# Patient Record
Sex: Female | Born: 1950
Health system: Southern US, Community
[De-identification: ages and names within clinical notes are randomized; demographics above are authoritative.]

## PROBLEM LIST (undated history)

## (undated) ENCOUNTER — Ambulatory Visit (HOSPITAL_BASED_OUTPATIENT_CLINIC_OR_DEPARTMENT_OTHER)

## (undated) DIAGNOSIS — R7881 Bacteremia: Secondary | ICD-10-CM

## (undated) DIAGNOSIS — Z9221 Personal history of antineoplastic chemotherapy: Secondary | ICD-10-CM

## (undated) DIAGNOSIS — I871 Compression of vein: Secondary | ICD-10-CM

## (undated) DIAGNOSIS — L0390 Cellulitis, unspecified: Secondary | ICD-10-CM

## (undated) DIAGNOSIS — I10 Essential (primary) hypertension: Secondary | ICD-10-CM

## (undated) DIAGNOSIS — G629 Polyneuropathy, unspecified: Secondary | ICD-10-CM

## (undated) DIAGNOSIS — D649 Anemia, unspecified: Secondary | ICD-10-CM

## (undated) DIAGNOSIS — M009 Pyogenic arthritis, unspecified: Secondary | ICD-10-CM

## (undated) DIAGNOSIS — I8290 Acute embolism and thrombosis of unspecified vein: Secondary | ICD-10-CM

## (undated) DIAGNOSIS — C50919 Malignant neoplasm of unspecified site of unspecified female breast: Secondary | ICD-10-CM

## (undated) DIAGNOSIS — IMO0002 Reserved for concepts with insufficient information to code with codable children: Secondary | ICD-10-CM

## (undated) DIAGNOSIS — M4628 Osteomyelitis of vertebra, sacral and sacrococcygeal region: Secondary | ICD-10-CM

## (undated) DIAGNOSIS — I509 Heart failure, unspecified: Secondary | ICD-10-CM

## (undated) DIAGNOSIS — F419 Anxiety disorder, unspecified: Secondary | ICD-10-CM

## (undated) HISTORY — PX: PICC LINE REMOVAL (ARMC HX): HXRAD1261

## (undated) HISTORY — PX: BACK SURGERY: SHX140

## (undated) HISTORY — PX: SURGICAL EXCISION OF EXCESSIVE SKIN: SHX6542

## (undated) HISTORY — PX: MASTECTOMY: SHX3

## (undated) HISTORY — PX: PICC LINE INSERTION: CATH118290

## (undated) HISTORY — DX: Essential (primary) hypertension: I10

## (undated) HISTORY — DX: Compression of vein: I87.1

## (undated) HISTORY — PX: PERIPHERALLY INSERTED CENTRAL CATHETER INSERTION: SHX2221

## (undated) HISTORY — PX: ANKLE SURGERY: SHX546

## (undated) HISTORY — DX: Malignant neoplasm of unspecified site of unspecified female breast: C50.919

## (undated) HISTORY — DX: Polyneuropathy, unspecified: G62.9

## (undated) HISTORY — DX: Acute embolism and thrombosis of unspecified vein: I82.90

## (undated) HISTORY — DX: Cellulitis, unspecified: L03.90

---

## 1984-08-27 HISTORY — PX: TUBAL LIGATION: SHX77

## 1987-08-28 HISTORY — PX: CHOLECYSTECTOMY: SHX55

## 2003-06-07 ENCOUNTER — Encounter (HOSPITAL_COMMUNITY): Admission: RE | Admit: 2003-06-07 | Discharge: 2003-09-05 | Payer: Self-pay | Admitting: Radiology

## 2003-06-07 ENCOUNTER — Encounter: Payer: Self-pay | Admitting: Radiology

## 2003-06-08 ENCOUNTER — Encounter: Payer: Self-pay | Admitting: Oncology

## 2003-06-08 ENCOUNTER — Encounter: Payer: Self-pay | Admitting: Radiology

## 2003-06-08 ENCOUNTER — Ambulatory Visit (HOSPITAL_COMMUNITY): Admission: RE | Admit: 2003-06-08 | Discharge: 2003-06-08 | Payer: Self-pay | Admitting: Oncology

## 2003-06-11 ENCOUNTER — Ambulatory Visit (HOSPITAL_COMMUNITY): Admission: RE | Admit: 2003-06-11 | Discharge: 2003-06-11 | Payer: Self-pay | Admitting: Oncology

## 2003-06-11 ENCOUNTER — Encounter: Payer: Self-pay | Admitting: Oncology

## 2003-06-15 ENCOUNTER — Encounter: Payer: Self-pay | Admitting: General Surgery

## 2003-06-15 ENCOUNTER — Ambulatory Visit (HOSPITAL_COMMUNITY): Admission: RE | Admit: 2003-06-15 | Discharge: 2003-06-15 | Payer: Self-pay | Admitting: General Surgery

## 2003-08-04 ENCOUNTER — Ambulatory Visit (HOSPITAL_COMMUNITY): Admission: RE | Admit: 2003-08-04 | Discharge: 2003-08-04 | Payer: Self-pay | Admitting: Oncology

## 2003-08-16 ENCOUNTER — Inpatient Hospital Stay (HOSPITAL_COMMUNITY): Admission: RE | Admit: 2003-08-16 | Discharge: 2003-08-27 | Payer: Self-pay | Admitting: Oncology

## 2003-10-25 ENCOUNTER — Ambulatory Visit (HOSPITAL_COMMUNITY): Admission: RE | Admit: 2003-10-25 | Discharge: 2003-10-25 | Payer: Self-pay | Admitting: Oncology

## 2003-10-27 ENCOUNTER — Ambulatory Visit (HOSPITAL_COMMUNITY): Admission: RE | Admit: 2003-10-27 | Discharge: 2003-10-27 | Payer: Self-pay | Admitting: Oncology

## 2004-01-26 ENCOUNTER — Ambulatory Visit (HOSPITAL_COMMUNITY): Admission: RE | Admit: 2004-01-26 | Discharge: 2004-01-26 | Payer: Self-pay | Admitting: Oncology

## 2004-04-26 ENCOUNTER — Ambulatory Visit (HOSPITAL_COMMUNITY): Admission: RE | Admit: 2004-04-26 | Discharge: 2004-04-26 | Payer: Self-pay | Admitting: Oncology

## 2004-06-29 ENCOUNTER — Ambulatory Visit: Payer: Self-pay | Admitting: Oncology

## 2004-07-24 ENCOUNTER — Ambulatory Visit (HOSPITAL_COMMUNITY): Admission: RE | Admit: 2004-07-24 | Discharge: 2004-07-24 | Payer: Self-pay | Admitting: Oncology

## 2004-08-17 ENCOUNTER — Ambulatory Visit: Payer: Self-pay | Admitting: Oncology

## 2004-09-07 ENCOUNTER — Ambulatory Visit (HOSPITAL_COMMUNITY): Admission: RE | Admit: 2004-09-07 | Discharge: 2004-09-07 | Payer: Self-pay | Admitting: Oncology

## 2004-09-27 DIAGNOSIS — Z9221 Personal history of antineoplastic chemotherapy: Secondary | ICD-10-CM

## 2004-09-27 HISTORY — DX: Personal history of antineoplastic chemotherapy: Z92.21

## 2004-10-05 ENCOUNTER — Ambulatory Visit: Payer: Self-pay | Admitting: Oncology

## 2004-10-16 ENCOUNTER — Ambulatory Visit (HOSPITAL_COMMUNITY): Admission: RE | Admit: 2004-10-16 | Discharge: 2004-10-16 | Payer: Self-pay | Admitting: Oncology

## 2004-10-21 ENCOUNTER — Encounter: Admission: RE | Admit: 2004-10-21 | Discharge: 2004-10-21 | Payer: Self-pay | Admitting: Oncology

## 2004-10-23 ENCOUNTER — Ambulatory Visit (HOSPITAL_COMMUNITY): Admission: RE | Admit: 2004-10-23 | Discharge: 2004-10-23 | Payer: Self-pay | Admitting: Oncology

## 2004-10-24 ENCOUNTER — Other Ambulatory Visit: Admission: RE | Admit: 2004-10-24 | Discharge: 2004-10-24 | Payer: Self-pay | Admitting: Radiology

## 2004-11-22 ENCOUNTER — Ambulatory Visit: Payer: Self-pay | Admitting: Oncology

## 2004-12-07 ENCOUNTER — Encounter (INDEPENDENT_AMBULATORY_CARE_PROVIDER_SITE_OTHER): Payer: Self-pay | Admitting: Surgery

## 2004-12-07 ENCOUNTER — Encounter (INDEPENDENT_AMBULATORY_CARE_PROVIDER_SITE_OTHER): Payer: Self-pay | Admitting: *Deleted

## 2004-12-07 ENCOUNTER — Inpatient Hospital Stay (HOSPITAL_COMMUNITY): Admission: RE | Admit: 2004-12-07 | Discharge: 2004-12-09 | Payer: Self-pay | Admitting: Surgery

## 2005-01-03 ENCOUNTER — Ambulatory Visit: Admission: RE | Admit: 2005-01-03 | Discharge: 2005-01-09 | Payer: Self-pay | Admitting: Radiation Oncology

## 2005-01-09 ENCOUNTER — Ambulatory Visit (HOSPITAL_COMMUNITY): Admission: RE | Admit: 2005-01-09 | Discharge: 2005-01-09 | Payer: Self-pay | Admitting: Oncology

## 2005-01-10 ENCOUNTER — Ambulatory Visit: Payer: Self-pay | Admitting: Oncology

## 2005-01-15 ENCOUNTER — Ambulatory Visit: Admission: RE | Admit: 2005-01-15 | Discharge: 2005-04-02 | Payer: Self-pay | Admitting: Radiation Oncology

## 2005-02-26 ENCOUNTER — Ambulatory Visit: Payer: Self-pay | Admitting: Oncology

## 2005-03-22 ENCOUNTER — Ambulatory Visit (HOSPITAL_COMMUNITY): Admission: RE | Admit: 2005-03-22 | Discharge: 2005-03-22 | Payer: Self-pay | Admitting: Oncology

## 2005-04-18 ENCOUNTER — Ambulatory Visit: Payer: Self-pay | Admitting: Oncology

## 2005-05-10 ENCOUNTER — Ambulatory Visit (HOSPITAL_COMMUNITY): Admission: RE | Admit: 2005-05-10 | Discharge: 2005-05-10 | Payer: Self-pay | Admitting: Oncology

## 2005-05-30 ENCOUNTER — Ambulatory Visit (HOSPITAL_BASED_OUTPATIENT_CLINIC_OR_DEPARTMENT_OTHER): Admission: RE | Admit: 2005-05-30 | Discharge: 2005-05-31 | Payer: Self-pay | Admitting: Surgery

## 2005-05-30 ENCOUNTER — Ambulatory Visit (HOSPITAL_COMMUNITY): Admission: RE | Admit: 2005-05-30 | Discharge: 2005-05-30 | Payer: Self-pay | Admitting: Surgery

## 2005-05-30 ENCOUNTER — Encounter (INDEPENDENT_AMBULATORY_CARE_PROVIDER_SITE_OTHER): Payer: Self-pay | Admitting: Specialist

## 2005-06-07 ENCOUNTER — Ambulatory Visit: Payer: Self-pay | Admitting: Oncology

## 2005-06-12 ENCOUNTER — Ambulatory Visit (HOSPITAL_COMMUNITY): Admission: RE | Admit: 2005-06-12 | Discharge: 2005-06-12 | Payer: Self-pay | Admitting: Oncology

## 2005-06-15 ENCOUNTER — Ambulatory Visit (HOSPITAL_COMMUNITY): Admission: RE | Admit: 2005-06-15 | Discharge: 2005-06-15 | Payer: Self-pay | Admitting: Oncology

## 2005-07-25 ENCOUNTER — Ambulatory Visit: Payer: Self-pay | Admitting: Oncology

## 2005-08-28 ENCOUNTER — Ambulatory Visit (HOSPITAL_COMMUNITY): Admission: RE | Admit: 2005-08-28 | Discharge: 2005-08-28 | Payer: Self-pay | Admitting: Oncology

## 2005-09-13 ENCOUNTER — Ambulatory Visit: Payer: Self-pay | Admitting: Oncology

## 2005-09-21 ENCOUNTER — Ambulatory Visit (HOSPITAL_COMMUNITY): Admission: RE | Admit: 2005-09-21 | Discharge: 2005-09-21 | Payer: Self-pay | Admitting: Oncology

## 2005-10-31 ENCOUNTER — Ambulatory Visit: Payer: Self-pay | Admitting: Oncology

## 2005-11-29 LAB — PROTIME-INR: INR: 2.7 (ref 2.00–3.50)

## 2005-12-04 ENCOUNTER — Ambulatory Visit (HOSPITAL_COMMUNITY): Admission: RE | Admit: 2005-12-04 | Discharge: 2005-12-04 | Payer: Self-pay | Admitting: Oncology

## 2005-12-06 LAB — COMPREHENSIVE METABOLIC PANEL
ALT: 13 U/L (ref 0–40)
AST: 14 U/L (ref 0–37)
Albumin: 4.1 g/dL (ref 3.5–5.2)
BUN: 10 mg/dL (ref 6–23)
CO2: 25 mEq/L (ref 19–32)
Calcium: 9 mg/dL (ref 8.4–10.5)
Chloride: 105 mEq/L (ref 96–112)
Creatinine, Ser: 0.8 mg/dL (ref 0.4–1.2)
Potassium: 3.7 mEq/L (ref 3.5–5.3)

## 2005-12-06 LAB — CBC WITH DIFFERENTIAL/PLATELET
Basophils Absolute: 0.1 10*3/uL (ref 0.0–0.1)
Eosinophils Absolute: 0.2 10*3/uL (ref 0.0–0.5)
HCT: 34.2 % — ABNORMAL LOW (ref 34.8–46.6)
HGB: 11.9 g/dL (ref 11.6–15.9)
MCH: 28.1 pg (ref 26.0–34.0)
MCV: 81.3 fL (ref 81.0–101.0)
MONO%: 8 % (ref 0.0–13.0)
NEUT#: 3.8 10*3/uL (ref 1.5–6.5)
NEUT%: 62.1 % (ref 39.6–76.8)
RDW: 16.1 % — ABNORMAL HIGH (ref 11.3–14.5)

## 2005-12-06 LAB — CANCER ANTIGEN 27.29: CA 27.29: 37 U/mL (ref 0–39)

## 2005-12-13 LAB — CBC WITH DIFFERENTIAL/PLATELET
BASO%: 1.3 % (ref 0.0–2.0)
Basophils Absolute: 0.1 10*3/uL (ref 0.0–0.1)
HCT: 36.6 % (ref 34.8–46.6)
HGB: 12.8 g/dL (ref 11.6–15.9)
LYMPH%: 27.9 % (ref 14.0–48.0)
MCHC: 34.9 g/dL (ref 32.0–36.0)
MONO#: 0.5 10*3/uL (ref 0.1–0.9)
NEUT%: 59.1 % (ref 39.6–76.8)
Platelets: 224 10*3/uL (ref 145–400)
WBC: 6.6 10*3/uL (ref 3.9–10.0)

## 2005-12-13 LAB — PROTIME-INR: INR: 4.5 — ABNORMAL HIGH (ref 2.00–3.50)

## 2005-12-19 ENCOUNTER — Ambulatory Visit: Payer: Self-pay | Admitting: Oncology

## 2005-12-20 LAB — PROTIME-INR
INR: 3.9 — ABNORMAL HIGH (ref 2.00–3.50)
Protime: 24 Seconds — ABNORMAL HIGH (ref 10.6–13.4)

## 2005-12-27 LAB — CBC WITH DIFFERENTIAL/PLATELET
BASO%: 0.5 % (ref 0.0–2.0)
LYMPH%: 27.4 % (ref 14.0–48.0)
MCH: 27.9 pg (ref 26.0–34.0)
MCHC: 34.9 g/dL (ref 32.0–36.0)
MCV: 80 fL — ABNORMAL LOW (ref 81.0–101.0)
MONO%: 10.1 % (ref 0.0–13.0)
Platelets: 314 10*3/uL (ref 145–400)
RBC: 4.23 10*6/uL (ref 3.70–5.32)

## 2005-12-27 LAB — PROTIME-INR: Protime: 16.5 Seconds — ABNORMAL HIGH (ref 10.6–13.4)

## 2006-01-03 LAB — CBC WITH DIFFERENTIAL/PLATELET
BASO%: 1 % (ref 0.0–2.0)
Basophils Absolute: 0.1 10*3/uL (ref 0.0–0.1)
EOS%: 4.3 % (ref 0.0–7.0)
Eosinophils Absolute: 0.4 10*3/uL (ref 0.0–0.5)
HCT: 34.8 % (ref 34.8–46.6)
HGB: 11.8 g/dL (ref 11.6–15.9)
LYMPH%: 42.7 % (ref 14.0–48.0)
MCH: 27.4 pg (ref 26.0–34.0)
MCHC: 34 g/dL (ref 32.0–36.0)
MCV: 80.6 fL — ABNORMAL LOW (ref 81.0–101.0)
MONO#: 0.5 10*3/uL (ref 0.1–0.9)
MONO%: 6 % (ref 0.0–13.0)
NEUT#: 3.9 10*3/uL (ref 1.5–6.5)
NEUT%: 45.9 % (ref 39.6–76.8)
Platelets: 317 10*3/uL (ref 145–400)
RBC: 4.31 10*6/uL (ref 3.70–5.32)
RDW: 14.4 % (ref 11.3–14.5)
WBC: 8.4 10*3/uL (ref 3.9–10.0)
lymph#: 3.6 10*3/uL — ABNORMAL HIGH (ref 0.9–3.3)

## 2006-01-03 LAB — PROTIME-INR
INR: 1 — ABNORMAL LOW (ref 2.00–3.50)
Protime: 12.6 Seconds (ref 10.6–13.4)

## 2006-01-10 LAB — PROTIME-INR: Protime: 15 Seconds — ABNORMAL HIGH (ref 10.6–13.4)

## 2006-01-17 LAB — PROTIME-INR

## 2006-01-24 LAB — CBC WITH DIFFERENTIAL/PLATELET
BASO%: 1 % (ref 0.0–2.0)
EOS%: 4.5 % (ref 0.0–7.0)
HCT: 35.9 % (ref 34.8–46.6)
HGB: 12.3 g/dL (ref 11.6–15.9)
LYMPH%: 26.1 % (ref 14.0–48.0)
MONO%: 6.5 % (ref 0.0–13.0)
WBC: 8.2 10*3/uL (ref 3.9–10.0)
lymph#: 2.2 10*3/uL (ref 0.9–3.3)

## 2006-01-24 LAB — PROTIME-INR
INR: 2.2 (ref 2.00–3.50)
Protime: 18.2 Seconds — ABNORMAL HIGH (ref 10.6–13.4)

## 2006-01-31 ENCOUNTER — Ambulatory Visit: Payer: Self-pay | Admitting: Oncology

## 2006-01-31 LAB — PROTIME-INR: Protime: 24.7 Seconds — ABNORMAL HIGH (ref 10.6–13.4)

## 2006-02-07 LAB — PROTIME-INR: Protime: 20.6 Seconds — ABNORMAL HIGH (ref 10.6–13.4)

## 2006-02-14 LAB — CBC WITH DIFFERENTIAL/PLATELET
Basophils Absolute: 0.1 10*3/uL (ref 0.0–0.1)
Eosinophils Absolute: 0.2 10*3/uL (ref 0.0–0.5)
HGB: 11.6 g/dL (ref 11.6–15.9)
MCV: 80.9 fL — ABNORMAL LOW (ref 81.0–101.0)
MONO#: 0.6 10*3/uL (ref 0.1–0.9)
MONO%: 8.2 % (ref 0.0–13.0)
NEUT#: 3.9 10*3/uL (ref 1.5–6.5)
RBC: 4.29 10*6/uL (ref 3.70–5.32)
RDW: 14.3 % (ref 11.3–14.5)
WBC: 6.8 10*3/uL (ref 3.9–10.0)
lymph#: 2 10*3/uL (ref 0.9–3.3)

## 2006-02-14 LAB — PROTIME-INR: INR: 1.7 — ABNORMAL LOW (ref 2.00–3.50)

## 2006-02-21 LAB — PROTIME-INR: Protime: 16.1 Seconds — ABNORMAL HIGH (ref 10.6–13.4)

## 2006-03-07 LAB — CBC WITH DIFFERENTIAL/PLATELET
Eosinophils Absolute: 0.2 10*3/uL (ref 0.0–0.5)
HCT: 34.3 % — ABNORMAL LOW (ref 34.8–46.6)
LYMPH%: 26.3 % (ref 14.0–48.0)
MONO#: 0.5 10*3/uL (ref 0.1–0.9)
NEUT#: 3.5 10*3/uL (ref 1.5–6.5)
NEUT%: 62 % (ref 39.6–76.8)
Platelets: 248 10*3/uL (ref 145–400)
WBC: 5.6 10*3/uL (ref 3.9–10.0)

## 2006-03-07 LAB — PROTIME-INR
INR: 2.3 (ref 2.00–3.50)
Protime: 18.4 Seconds — ABNORMAL HIGH (ref 10.6–13.4)

## 2006-03-07 LAB — CANCER ANTIGEN 27.29: CA 27.29: 34 U/mL (ref 0–39)

## 2006-03-07 LAB — COMPREHENSIVE METABOLIC PANEL
CO2: 28 mEq/L (ref 19–32)
Creatinine, Ser: 0.85 mg/dL (ref 0.40–1.20)
Glucose, Bld: 105 mg/dL — ABNORMAL HIGH (ref 70–99)
Total Bilirubin: 0.5 mg/dL (ref 0.3–1.2)
Total Protein: 7.3 g/dL (ref 6.0–8.3)

## 2006-03-07 LAB — LACTATE DEHYDROGENASE: LDH: 186 U/L (ref 94–250)

## 2006-03-11 ENCOUNTER — Encounter: Payer: Self-pay | Admitting: Cardiology

## 2006-03-11 ENCOUNTER — Ambulatory Visit: Payer: Self-pay

## 2006-03-11 ENCOUNTER — Ambulatory Visit (HOSPITAL_COMMUNITY): Admission: RE | Admit: 2006-03-11 | Discharge: 2006-03-11 | Payer: Self-pay | Admitting: Oncology

## 2006-03-14 ENCOUNTER — Ambulatory Visit: Payer: Self-pay | Admitting: Oncology

## 2006-03-14 LAB — CBC WITH DIFFERENTIAL/PLATELET
EOS%: 3.4 % (ref 0.0–7.0)
Eosinophils Absolute: 0.2 10*3/uL (ref 0.0–0.5)
LYMPH%: 28 % (ref 14.0–48.0)
MCH: 28 pg (ref 26.0–34.0)
MCV: 80.3 fL — ABNORMAL LOW (ref 81.0–101.0)
MONO%: 7.5 % (ref 0.0–13.0)
NEUT#: 4.1 10*3/uL (ref 1.5–6.5)
Platelets: 245 10*3/uL (ref 145–400)
RBC: 4.29 10*6/uL (ref 3.70–5.32)
RDW: 15.9 % — ABNORMAL HIGH (ref 11.3–14.5)

## 2006-03-14 LAB — COMPREHENSIVE METABOLIC PANEL
Albumin: 4 g/dL (ref 3.5–5.2)
Alkaline Phosphatase: 121 U/L — ABNORMAL HIGH (ref 39–117)
BUN: 13 mg/dL (ref 6–23)
Calcium: 9.1 mg/dL (ref 8.4–10.5)
Chloride: 101 mEq/L (ref 96–112)
Creatinine, Ser: 0.79 mg/dL (ref 0.40–1.20)
Glucose, Bld: 79 mg/dL (ref 70–99)
Potassium: 4.5 mEq/L (ref 3.5–5.3)

## 2006-03-14 LAB — PROTIME-INR
INR: 1.2 — ABNORMAL LOW (ref 2.00–3.50)
Protime: 13.6 Seconds — ABNORMAL HIGH (ref 10.6–13.4)

## 2006-03-14 LAB — LACTATE DEHYDROGENASE: LDH: 198 U/L (ref 94–250)

## 2006-03-28 LAB — PROTIME-INR: INR: 3.2 (ref 2.00–3.50)

## 2006-04-04 LAB — COMPREHENSIVE METABOLIC PANEL
ALT: 12 U/L (ref 0–40)
BUN: 14 mg/dL (ref 6–23)
CO2: 24 mEq/L (ref 19–32)
Calcium: 8.6 mg/dL (ref 8.4–10.5)
Chloride: 103 mEq/L (ref 96–112)
Creatinine, Ser: 0.89 mg/dL (ref 0.40–1.20)
Glucose, Bld: 87 mg/dL (ref 70–99)
Total Bilirubin: 0.3 mg/dL (ref 0.3–1.2)

## 2006-04-04 LAB — CBC WITH DIFFERENTIAL/PLATELET
BASO%: 0.7 % (ref 0.0–2.0)
Basophils Absolute: 0 10*3/uL (ref 0.0–0.1)
EOS%: 3.6 % (ref 0.0–7.0)
HCT: 35.2 % (ref 34.8–46.6)
HGB: 11.8 g/dL (ref 11.6–15.9)
LYMPH%: 38.4 % (ref 14.0–48.0)
MCH: 27.4 pg (ref 26.0–34.0)
MCHC: 33.4 g/dL (ref 32.0–36.0)
MCV: 81.9 fL (ref 81.0–101.0)
MONO%: 9.7 % (ref 0.0–13.0)
NEUT%: 47.6 % (ref 39.6–76.8)
lymph#: 2.3 10*3/uL (ref 0.9–3.3)

## 2006-04-04 LAB — PROTIME-INR

## 2006-04-04 LAB — LACTATE DEHYDROGENASE: LDH: 220 U/L (ref 94–250)

## 2006-04-11 LAB — PROTIME-INR
INR: 3.5 (ref 2.00–3.50)
Protime: 42 Seconds — ABNORMAL HIGH (ref 10.6–13.4)

## 2006-04-18 LAB — PROTIME-INR: INR: 2.8 (ref 2.00–3.50)

## 2006-04-24 ENCOUNTER — Ambulatory Visit: Payer: Self-pay | Admitting: Oncology

## 2006-04-26 LAB — COMPREHENSIVE METABOLIC PANEL
Albumin: 3.8 g/dL (ref 3.5–5.2)
BUN: 9 mg/dL (ref 6–23)
Calcium: 8.9 mg/dL (ref 8.4–10.5)
Chloride: 104 mEq/L (ref 96–112)
Glucose, Bld: 91 mg/dL (ref 70–99)
Potassium: 3.9 mEq/L (ref 3.5–5.3)

## 2006-04-26 LAB — CBC WITH DIFFERENTIAL/PLATELET
Basophils Absolute: 0.1 10*3/uL (ref 0.0–0.1)
Eosinophils Absolute: 0.2 10*3/uL (ref 0.0–0.5)
HCT: 33.9 % — ABNORMAL LOW (ref 34.8–46.6)
HGB: 11.5 g/dL — ABNORMAL LOW (ref 11.6–15.9)
MCV: 80.3 fL — ABNORMAL LOW (ref 81.0–101.0)
MONO%: 5.6 % (ref 0.0–13.0)
NEUT#: 4.8 10*3/uL (ref 1.5–6.5)
NEUT%: 64 % (ref 39.6–76.8)
RDW: 14.4 % (ref 11.3–14.5)
lymph#: 2 10*3/uL (ref 0.9–3.3)

## 2006-04-26 LAB — LACTATE DEHYDROGENASE: LDH: 232 U/L (ref 94–250)

## 2006-04-26 LAB — PROTIME-INR: Protime: 22.8 Seconds — ABNORMAL HIGH (ref 10.6–13.4)

## 2006-05-02 ENCOUNTER — Ambulatory Visit (HOSPITAL_COMMUNITY): Admission: RE | Admit: 2006-05-02 | Discharge: 2006-05-02 | Payer: Self-pay | Admitting: Oncology

## 2006-05-02 LAB — PROTIME-INR
INR: 1.7 — ABNORMAL LOW (ref 2.00–3.50)
Protime: 20.4 Seconds — ABNORMAL HIGH (ref 10.6–13.4)

## 2006-05-08 LAB — PROTIME-INR
INR: 1.7 — ABNORMAL LOW (ref 2.00–3.50)
Protime: 20.4 Seconds — ABNORMAL HIGH (ref 10.6–13.4)

## 2006-05-16 LAB — CBC WITH DIFFERENTIAL/PLATELET
BASO%: 0.7 % (ref 0.0–2.0)
Basophils Absolute: 0 10*3/uL (ref 0.0–0.1)
EOS%: 3.8 % (ref 0.0–7.0)
HCT: 33.4 % — ABNORMAL LOW (ref 34.8–46.6)
HGB: 11.7 g/dL (ref 11.6–15.9)
LYMPH%: 26.9 % (ref 14.0–48.0)
MCH: 28.1 pg (ref 26.0–34.0)
MCHC: 34.9 g/dL (ref 32.0–36.0)
MCV: 80.4 fL — ABNORMAL LOW (ref 81.0–101.0)
NEUT%: 58.6 % (ref 39.6–76.8)
Platelets: 230 10*3/uL (ref 145–400)
lymph#: 1.9 10*3/uL (ref 0.9–3.3)

## 2006-05-23 LAB — CBC WITH DIFFERENTIAL/PLATELET
Eosinophils Absolute: 0.2 10*3/uL (ref 0.0–0.5)
HCT: 35 % (ref 34.8–46.6)
LYMPH%: 26.8 % (ref 14.0–48.0)
MCHC: 34.7 g/dL (ref 32.0–36.0)
MONO#: 0.6 10*3/uL (ref 0.1–0.9)
NEUT%: 60.6 % (ref 39.6–76.8)
Platelets: 239 10*3/uL (ref 145–400)
WBC: 6.6 10*3/uL (ref 3.9–10.0)

## 2006-05-23 LAB — PROTIME-INR

## 2006-05-27 ENCOUNTER — Ambulatory Visit (HOSPITAL_COMMUNITY): Admission: RE | Admit: 2006-05-27 | Discharge: 2006-05-27 | Payer: Self-pay | Admitting: Oncology

## 2006-05-29 LAB — CBC WITH DIFFERENTIAL/PLATELET
Basophils Absolute: 0 10*3/uL (ref 0.0–0.1)
EOS%: 3.9 % (ref 0.0–7.0)
Eosinophils Absolute: 0.2 10*3/uL (ref 0.0–0.5)
HCT: 33.7 % — ABNORMAL LOW (ref 34.8–46.6)
HGB: 11.6 g/dL (ref 11.6–15.9)
MCH: 27.9 pg (ref 26.0–34.0)
MCV: 80.7 fL — ABNORMAL LOW (ref 81.0–101.0)
NEUT#: 3.3 10*3/uL (ref 1.5–6.5)
NEUT%: 63 % (ref 39.6–76.8)
RDW: 15.7 % — ABNORMAL HIGH (ref 11.3–14.5)
lymph#: 1.2 10*3/uL (ref 0.9–3.3)

## 2006-05-29 LAB — PROTIME-INR: INR: 1.7 — ABNORMAL LOW (ref 2.00–3.50)

## 2006-05-29 LAB — COMPREHENSIVE METABOLIC PANEL
BUN: 16 mg/dL (ref 6–23)
CO2: 26 mEq/L (ref 19–32)
Calcium: 9.4 mg/dL (ref 8.4–10.5)
Chloride: 105 mEq/L (ref 96–112)
Creatinine, Ser: 0.76 mg/dL (ref 0.40–1.20)
Glucose, Bld: 111 mg/dL — ABNORMAL HIGH (ref 70–99)
Total Bilirubin: 0.4 mg/dL (ref 0.3–1.2)

## 2006-05-29 LAB — LACTATE DEHYDROGENASE: LDH: 207 U/L (ref 94–250)

## 2006-05-29 LAB — CANCER ANTIGEN 27.29: CA 27.29: 27 U/mL (ref 0–39)

## 2006-06-05 ENCOUNTER — Encounter: Payer: Self-pay | Admitting: Internal Medicine

## 2006-06-05 ENCOUNTER — Ambulatory Visit: Payer: Self-pay

## 2006-06-06 ENCOUNTER — Ambulatory Visit: Payer: Self-pay | Admitting: Oncology

## 2006-06-06 LAB — PROTIME-INR
INR: 1.2 — ABNORMAL LOW (ref 2.00–3.50)
Protime: 14.4 Seconds — ABNORMAL HIGH (ref 10.6–13.4)

## 2006-06-06 LAB — CBC WITH DIFFERENTIAL/PLATELET
Eosinophils Absolute: 0.3 10*3/uL (ref 0.0–0.5)
HGB: 12.3 g/dL (ref 11.6–15.9)
MCV: 79.2 fL — ABNORMAL LOW (ref 81.0–101.0)
MONO#: 0.7 10*3/uL (ref 0.1–0.9)
MONO%: 10 % (ref 0.0–13.0)
NEUT#: 4.2 10*3/uL (ref 1.5–6.5)
RBC: 4.44 10*6/uL (ref 3.70–5.32)
RDW: 13.4 % (ref 11.3–14.5)
WBC: 7 10*3/uL (ref 3.9–10.0)
lymph#: 1.8 10*3/uL (ref 0.9–3.3)

## 2006-06-06 LAB — UA PROTEIN, DIPSTICK - CHCC: Protein, Urine: NEGATIVE mg/dL

## 2006-06-11 ENCOUNTER — Ambulatory Visit: Admission: RE | Admit: 2006-06-11 | Discharge: 2006-07-12 | Payer: Self-pay | Admitting: Radiation Oncology

## 2006-06-13 LAB — CBC WITH DIFFERENTIAL/PLATELET
BASO%: 1.4 % (ref 0.0–2.0)
EOS%: 2.3 % (ref 0.0–7.0)
MCH: 28.2 pg (ref 26.0–34.0)
MCHC: 35.6 g/dL (ref 32.0–36.0)
MCV: 79.2 fL — ABNORMAL LOW (ref 81.0–101.0)
MONO%: 5.7 % (ref 0.0–13.0)
RBC: 4.14 10*6/uL (ref 3.70–5.32)
RDW: 13.1 % (ref 11.3–14.5)
lymph#: 1.4 10*3/uL (ref 0.9–3.3)

## 2006-06-13 LAB — PROTIME-INR
INR: 1.5 — ABNORMAL LOW (ref 2.00–3.50)
Protime: 18 Seconds — ABNORMAL HIGH (ref 10.6–13.4)

## 2006-06-20 LAB — CBC WITH DIFFERENTIAL/PLATELET
Basophils Absolute: 0 10*3/uL (ref 0.0–0.1)
EOS%: 2.9 % (ref 0.0–7.0)
Eosinophils Absolute: 0.2 10*3/uL (ref 0.0–0.5)
HGB: 12.3 g/dL (ref 11.6–15.9)
MCV: 77.4 fL — ABNORMAL LOW (ref 81.0–101.0)
MONO%: 10 % (ref 0.0–13.0)
NEUT#: 4.1 10*3/uL (ref 1.5–6.5)
RBC: 4.56 10*6/uL (ref 3.70–5.32)
RDW: 12.9 % (ref 11.3–14.5)
lymph#: 1.7 10*3/uL (ref 0.9–3.3)

## 2006-06-20 LAB — PROTIME-INR
INR: 1.7 — ABNORMAL LOW (ref 2.00–3.50)
Protime: 20.4 Seconds — ABNORMAL HIGH (ref 10.6–13.4)

## 2006-06-27 LAB — PROTIME-INR: INR: 3.7 — ABNORMAL HIGH (ref 2.00–3.50)

## 2006-06-27 LAB — CBC WITH DIFFERENTIAL/PLATELET
Basophils Absolute: 0.1 10*3/uL (ref 0.0–0.1)
EOS%: 2.3 % (ref 0.0–7.0)
HGB: 12 g/dL (ref 11.6–15.9)
MCH: 27.5 pg (ref 26.0–34.0)
NEUT#: 4.4 10*3/uL (ref 1.5–6.5)
RDW: 14.2 % (ref 11.3–14.5)
WBC: 7.2 10*3/uL (ref 3.9–10.0)
lymph#: 1.9 10*3/uL (ref 0.9–3.3)

## 2006-06-27 LAB — UA PROTEIN, DIPSTICK - CHCC: Protein, Urine: <30 mg/dL

## 2006-07-04 LAB — PROTIME-INR: Protime: 20.4 Seconds — ABNORMAL HIGH (ref 10.6–13.4)

## 2006-07-11 LAB — CBC WITH DIFFERENTIAL/PLATELET
Eosinophils Absolute: 0.3 10*3/uL (ref 0.0–0.5)
HCT: 34.3 % — ABNORMAL LOW (ref 34.8–46.6)
LYMPH%: 21.8 % (ref 14.0–48.0)
MCHC: 34.3 g/dL (ref 32.0–36.0)
MCV: 81.8 fL (ref 81.0–101.0)
MONO#: 0.7 10*3/uL (ref 0.1–0.9)
MONO%: 11 % (ref 0.0–13.0)
NEUT#: 3.9 10*3/uL (ref 1.5–6.5)
NEUT%: 61.3 % (ref 39.6–76.8)
Platelets: 199 10*3/uL (ref 145–400)
RBC: 4.2 10*6/uL (ref 3.70–5.32)
WBC: 6.3 10*3/uL (ref 3.9–10.0)

## 2006-07-11 LAB — PROTIME-INR: Protime: 15.6 Seconds — ABNORMAL HIGH (ref 10.6–13.4)

## 2006-07-17 LAB — CBC WITH DIFFERENTIAL/PLATELET
Basophils Absolute: 0.1 10*3/uL (ref 0.0–0.1)
Eosinophils Absolute: 0.2 10*3/uL (ref 0.0–0.5)
HCT: 34.5 % — ABNORMAL LOW (ref 34.8–46.6)
HGB: 11.7 g/dL (ref 11.6–15.9)
LYMPH%: 27 % (ref 14.0–48.0)
MCV: 82.3 fL (ref 81.0–101.0)
MONO#: 0.6 10*3/uL (ref 0.1–0.9)
MONO%: 10 % (ref 0.0–13.0)
NEUT#: 3.2 10*3/uL (ref 1.5–6.5)
NEUT%: 56.7 % (ref 39.6–76.8)
Platelets: 231 10*3/uL (ref 145–400)
WBC: 5.6 10*3/uL (ref 3.9–10.0)

## 2006-07-17 LAB — COMPREHENSIVE METABOLIC PANEL
AST: 15 U/L (ref 0–37)
Alkaline Phosphatase: 110 U/L (ref 39–117)
BUN: 10 mg/dL (ref 6–23)
Creatinine, Ser: 0.91 mg/dL (ref 0.40–1.20)
Glucose, Bld: 104 mg/dL — ABNORMAL HIGH (ref 70–99)
Potassium: 3.9 mEq/L (ref 3.5–5.3)
Total Bilirubin: 0.6 mg/dL (ref 0.3–1.2)

## 2006-07-17 LAB — PROTHROMBIN TIME: Prothrombin Time: 20.9 seconds — ABNORMAL HIGH (ref 11.6–15.2)

## 2006-07-19 ENCOUNTER — Ambulatory Visit: Payer: Self-pay | Admitting: Oncology

## 2006-07-24 ENCOUNTER — Ambulatory Visit (HOSPITAL_COMMUNITY): Admission: RE | Admit: 2006-07-24 | Discharge: 2006-07-24 | Payer: Self-pay | Admitting: Oncology

## 2006-07-24 LAB — CBC WITH DIFFERENTIAL/PLATELET
Basophils Absolute: 0.1 10*3/uL (ref 0.0–0.1)
EOS%: 3.8 % (ref 0.0–7.0)
Eosinophils Absolute: 0.2 10*3/uL (ref 0.0–0.5)
HCT: 33.9 % — ABNORMAL LOW (ref 34.8–46.6)
HGB: 11.6 g/dL (ref 11.6–15.9)
MCH: 27.6 pg (ref 26.0–34.0)
MCV: 80.7 fL — ABNORMAL LOW (ref 81.0–101.0)
MONO%: 9.2 % (ref 0.0–13.0)
NEUT#: 3.8 10*3/uL (ref 1.5–6.5)
NEUT%: 60.9 % (ref 39.6–76.8)
RDW: 14.1 % (ref 11.3–14.5)

## 2006-07-24 LAB — PROTIME-INR: INR: 2.4 (ref 2.00–3.50)

## 2006-07-30 ENCOUNTER — Ambulatory Visit: Admission: RE | Admit: 2006-07-30 | Discharge: 2006-10-02 | Payer: Self-pay | Admitting: Radiation Oncology

## 2006-07-31 DIAGNOSIS — IMO0001 Reserved for inherently not codable concepts without codable children: Secondary | ICD-10-CM

## 2006-07-31 HISTORY — DX: Reserved for inherently not codable concepts without codable children: IMO0001

## 2006-08-01 LAB — COMPREHENSIVE METABOLIC PANEL
Albumin: 3.9 g/dL (ref 3.5–5.2)
Alkaline Phosphatase: 104 U/L (ref 39–117)
BUN: 9 mg/dL (ref 6–23)
Glucose, Bld: 109 mg/dL — ABNORMAL HIGH (ref 70–99)
Total Bilirubin: 0.9 mg/dL (ref 0.3–1.2)

## 2006-08-01 LAB — CBC WITH DIFFERENTIAL/PLATELET
Basophils Absolute: 0.1 10*3/uL (ref 0.0–0.1)
Eosinophils Absolute: 0.1 10*3/uL (ref 0.0–0.5)
HGB: 11.9 g/dL (ref 11.6–15.9)
LYMPH%: 19.7 % (ref 14.0–48.0)
MCV: 81.9 fL (ref 81.0–101.0)
MONO%: 6.7 % (ref 0.0–13.0)
NEUT#: 4.7 10*3/uL (ref 1.5–6.5)
Platelets: 238 10*3/uL (ref 145–400)

## 2006-08-01 LAB — PROTIME-INR: INR: 1.5 — ABNORMAL LOW (ref 2.00–3.50)

## 2006-08-08 LAB — CBC WITH DIFFERENTIAL/PLATELET
Basophils Absolute: 0.1 10*3/uL (ref 0.0–0.1)
EOS%: 4.5 % (ref 0.0–7.0)
HCT: 35.2 % (ref 34.8–46.6)
HGB: 12.1 g/dL (ref 11.6–15.9)
MCH: 27.7 pg (ref 26.0–34.0)
MONO#: 0.6 10*3/uL (ref 0.1–0.9)
NEUT#: 3.8 10*3/uL (ref 1.5–6.5)
NEUT%: 57.4 % (ref 39.6–76.8)
RDW: 14.7 % — ABNORMAL HIGH (ref 11.3–14.5)
WBC: 6.7 10*3/uL (ref 3.9–10.0)
lymph#: 1.9 10*3/uL (ref 0.9–3.3)

## 2006-08-08 LAB — PROTIME-INR: INR: 1.5 — ABNORMAL LOW (ref 2.00–3.50)

## 2006-08-16 LAB — CBC WITH DIFFERENTIAL/PLATELET
Eosinophils Absolute: 0.2 10*3/uL (ref 0.0–0.5)
HCT: 36.1 % (ref 34.8–46.6)
LYMPH%: 17.3 % (ref 14.0–48.0)
MCV: 83.2 fL (ref 81.0–101.0)
MONO%: 7.3 % (ref 0.0–13.0)
NEUT%: 72 % (ref 39.6–76.8)
Platelets: 238 10*3/uL (ref 145–400)
RBC: 4.34 10*6/uL (ref 3.70–5.32)
WBC: 7.4 10*3/uL (ref 3.9–10.0)

## 2006-08-16 LAB — COMPREHENSIVE METABOLIC PANEL
Albumin: 3.9 g/dL (ref 3.5–5.2)
BUN: 9 mg/dL (ref 6–23)
Calcium: 9 mg/dL (ref 8.4–10.5)
Chloride: 102 mEq/L (ref 96–112)
Glucose, Bld: 106 mg/dL — ABNORMAL HIGH (ref 70–99)
Potassium: 3.6 mEq/L (ref 3.5–5.3)

## 2006-08-16 LAB — PROTIME-INR: Protime: 20.4 Seconds — ABNORMAL HIGH (ref 10.6–13.4)

## 2006-08-22 ENCOUNTER — Ambulatory Visit (HOSPITAL_COMMUNITY): Admission: RE | Admit: 2006-08-22 | Discharge: 2006-08-22 | Payer: Self-pay | Admitting: Oncology

## 2006-08-22 LAB — PROTIME-INR: INR: 1.1 — ABNORMAL LOW (ref 2.00–3.50)

## 2006-08-22 LAB — CBC WITH DIFFERENTIAL/PLATELET
Basophils Absolute: 0.1 10*3/uL (ref 0.0–0.1)
EOS%: 4.1 % (ref 0.0–7.0)
Eosinophils Absolute: 0.3 10*3/uL (ref 0.0–0.5)
HGB: 12.1 g/dL (ref 11.6–15.9)
MONO#: 0.4 10*3/uL (ref 0.1–0.9)
NEUT#: 5.2 10*3/uL (ref 1.5–6.5)
RDW: 14.3 % (ref 11.3–14.5)
lymph#: 1 10*3/uL (ref 0.9–3.3)

## 2006-08-30 ENCOUNTER — Ambulatory Visit: Payer: Self-pay | Admitting: Oncology

## 2006-08-30 LAB — CBC WITH DIFFERENTIAL/PLATELET
BASO%: 0.5 % (ref 0.0–2.0)
Basophils Absolute: 0 10*3/uL (ref 0.0–0.1)
EOS%: 2 % (ref 0.0–7.0)
HCT: 35.5 % (ref 34.8–46.6)
LYMPH%: 21.9 % (ref 14.0–48.0)
MCH: 28 pg (ref 26.0–34.0)
MCHC: 35.1 g/dL (ref 32.0–36.0)
MCV: 79.7 fL — ABNORMAL LOW (ref 81.0–101.0)
MONO%: 6.4 % (ref 0.0–13.0)
NEUT%: 69.2 % (ref 39.6–76.8)
Platelets: 279 10*3/uL (ref 145–400)

## 2006-09-02 LAB — CBC WITH DIFFERENTIAL/PLATELET
BASO%: 0.9 % (ref 0.0–2.0)
Basophils Absolute: 0.1 10*3/uL (ref 0.0–0.1)
EOS%: 4.2 % (ref 0.0–7.0)
HCT: 35.4 % (ref 34.8–46.6)
HGB: 12.2 g/dL (ref 11.6–15.9)
LYMPH%: 29.6 % (ref 14.0–48.0)
MCH: 27.7 pg (ref 26.0–34.0)
MCHC: 34.4 g/dL (ref 32.0–36.0)
NEUT%: 56.8 % (ref 39.6–76.8)
Platelets: 257 10*3/uL (ref 145–400)

## 2006-09-09 LAB — CBC WITH DIFFERENTIAL/PLATELET
Eosinophils Absolute: 0.3 10*3/uL (ref 0.0–0.5)
HCT: 36.1 % (ref 34.8–46.6)
LYMPH%: 24.8 % (ref 14.0–48.0)
MONO#: 0.5 10*3/uL (ref 0.1–0.9)
NEUT#: 3.4 10*3/uL (ref 1.5–6.5)
NEUT%: 59.7 % (ref 39.6–76.8)
Platelets: 273 10*3/uL (ref 145–400)
WBC: 5.7 10*3/uL (ref 3.9–10.0)

## 2006-09-09 LAB — PROTHROMBIN TIME
INR: 5.2 (ref 0.0–1.5)
Prothrombin Time: 52.1 seconds — ABNORMAL HIGH (ref 11.6–15.2)

## 2006-09-11 LAB — PROTIME-INR
INR: 3.9 — ABNORMAL HIGH (ref 2.00–3.50)
Protime: 46.8 Seconds — ABNORMAL HIGH (ref 10.6–13.4)

## 2006-09-16 LAB — PROTIME-INR

## 2006-09-16 LAB — CBC WITH DIFFERENTIAL/PLATELET
BASO%: 1.3 % (ref 0.0–2.0)
EOS%: 4.8 % (ref 0.0–7.0)
MCH: 27.8 pg (ref 26.0–34.0)
MCHC: 33.5 g/dL (ref 32.0–36.0)
NEUT%: 60.4 % (ref 39.6–76.8)
RDW: 14.3 % (ref 11.3–14.5)
lymph#: 2.2 10*3/uL (ref 0.9–3.3)

## 2006-09-16 LAB — COMPREHENSIVE METABOLIC PANEL
ALT: 18 U/L (ref 0–35)
AST: 17 U/L (ref 0–37)
Alkaline Phosphatase: 116 U/L (ref 39–117)
BUN: 12 mg/dL (ref 6–23)
Calcium: 9.1 mg/dL (ref 8.4–10.5)
Creatinine, Ser: 0.89 mg/dL (ref 0.40–1.20)
Total Bilirubin: 0.6 mg/dL (ref 0.3–1.2)

## 2006-09-25 LAB — CBC WITH DIFFERENTIAL/PLATELET
Basophils Absolute: 0 10*3/uL (ref 0.0–0.1)
EOS%: 4 % (ref 0.0–7.0)
HCT: 35.1 % (ref 34.8–46.6)
HGB: 12 g/dL (ref 11.6–15.9)
LYMPH%: 23.4 % (ref 14.0–48.0)
MCH: 28.5 pg (ref 26.0–34.0)
MCHC: 34.3 g/dL (ref 32.0–36.0)
NEUT%: 64.4 % (ref 39.6–76.8)
Platelets: 248 10*3/uL (ref 145–400)
lymph#: 1.4 10*3/uL (ref 0.9–3.3)

## 2006-09-25 LAB — COMPREHENSIVE METABOLIC PANEL
ALT: 12 U/L (ref 0–35)
CO2: 27 mEq/L (ref 19–32)
Calcium: 8.8 mg/dL (ref 8.4–10.5)
Chloride: 103 mEq/L (ref 96–112)
Creatinine, Ser: 0.8 mg/dL (ref 0.40–1.20)
Glucose, Bld: 103 mg/dL — ABNORMAL HIGH (ref 70–99)
Sodium: 143 mEq/L (ref 135–145)
Total Protein: 7.2 g/dL (ref 6.0–8.3)

## 2006-09-25 LAB — PROTIME-INR

## 2006-09-30 LAB — CBC WITH DIFFERENTIAL/PLATELET
BASO%: 0.4 % (ref 0.0–2.0)
MCHC: 35.2 g/dL (ref 32.0–36.0)
MONO#: 0.4 10*3/uL (ref 0.1–0.9)
NEUT#: 4.1 10*3/uL (ref 1.5–6.5)
RBC: 4.17 10*6/uL (ref 3.70–5.32)
WBC: 5.8 10*3/uL (ref 3.9–10.0)
lymph#: 1.1 10*3/uL (ref 0.9–3.3)

## 2006-09-30 LAB — COMPREHENSIVE METABOLIC PANEL
ALT: 16 U/L (ref 0–35)
BUN: 13 mg/dL (ref 6–23)
CO2: 26 mEq/L (ref 19–32)
Calcium: 9.1 mg/dL (ref 8.4–10.5)
Chloride: 103 mEq/L (ref 96–112)
Creatinine, Ser: 0.78 mg/dL (ref 0.40–1.20)
Glucose, Bld: 124 mg/dL — ABNORMAL HIGH (ref 70–99)

## 2006-10-09 LAB — COMPREHENSIVE METABOLIC PANEL
ALT: 12 U/L (ref 0–35)
AST: 13 U/L (ref 0–37)
Albumin: 3.9 g/dL (ref 3.5–5.2)
BUN: 14 mg/dL (ref 6–23)
Calcium: 8.9 mg/dL (ref 8.4–10.5)
Chloride: 101 mEq/L (ref 96–112)
Potassium: 3.8 mEq/L (ref 3.5–5.3)

## 2006-10-09 LAB — CBC WITH DIFFERENTIAL/PLATELET
BASO%: 0.4 % (ref 0.0–2.0)
HCT: 35.5 % (ref 34.8–46.6)
HGB: 12.5 g/dL (ref 11.6–15.9)
MCHC: 35.3 g/dL (ref 32.0–36.0)
MONO#: 0.6 10*3/uL (ref 0.1–0.9)
NEUT#: 4.9 10*3/uL (ref 1.5–6.5)
NEUT%: 64.1 % (ref 39.6–76.8)
WBC: 7.7 10*3/uL (ref 3.9–10.0)
lymph#: 1.9 10*3/uL (ref 0.9–3.3)

## 2006-10-09 LAB — PROTIME-INR

## 2006-10-14 LAB — COMPREHENSIVE METABOLIC PANEL
ALT: 18 U/L (ref 0–35)
CO2: 26 mEq/L (ref 19–32)
Calcium: 9.1 mg/dL (ref 8.4–10.5)
Chloride: 104 mEq/L (ref 96–112)
Creatinine, Ser: 0.74 mg/dL (ref 0.40–1.20)
Glucose, Bld: 107 mg/dL — ABNORMAL HIGH (ref 70–99)

## 2006-10-14 LAB — PROTIME-INR

## 2006-10-14 LAB — CBC WITH DIFFERENTIAL/PLATELET
BASO%: 1.4 % (ref 0.0–2.0)
Basophils Absolute: 0.1 10*3/uL (ref 0.0–0.1)
EOS%: 2 % (ref 0.0–7.0)
HCT: 34.5 % — ABNORMAL LOW (ref 34.8–46.6)
HGB: 12.3 g/dL (ref 11.6–15.9)
LYMPH%: 18.8 % (ref 14.0–48.0)
MCH: 29.3 pg (ref 26.0–34.0)
MCHC: 35.6 g/dL (ref 32.0–36.0)
MCV: 82.2 fL (ref 81.0–101.0)
MONO%: 5.8 % (ref 0.0–13.0)
NEUT%: 72 % (ref 39.6–76.8)
Platelets: 270 10*3/uL (ref 145–400)
lymph#: 1.6 10*3/uL (ref 0.9–3.3)

## 2006-10-16 ENCOUNTER — Ambulatory Visit: Payer: Self-pay | Admitting: Oncology

## 2006-10-21 LAB — PROTIME-INR
INR: 2.8 (ref 2.00–3.50)
Protime: 33.6 Seconds — ABNORMAL HIGH (ref 10.6–13.4)

## 2006-10-30 LAB — COMPREHENSIVE METABOLIC PANEL
ALT: 12 U/L (ref 0–35)
Albumin: 4.2 g/dL (ref 3.5–5.2)
CO2: 25 mEq/L (ref 19–32)
Calcium: 9.1 mg/dL (ref 8.4–10.5)
Chloride: 103 mEq/L (ref 96–112)
Glucose, Bld: 90 mg/dL (ref 70–99)
Sodium: 141 mEq/L (ref 135–145)
Total Protein: 8.1 g/dL (ref 6.0–8.3)

## 2006-10-30 LAB — CBC WITH DIFFERENTIAL/PLATELET
BASO%: 0.3 % (ref 0.0–2.0)
LYMPH%: 18.3 % (ref 14.0–48.0)
MCHC: 34.5 g/dL (ref 32.0–36.0)
MCV: 83.1 fL (ref 81.0–101.0)
MONO#: 0.5 10*3/uL (ref 0.1–0.9)
MONO%: 5.7 % (ref 0.0–13.0)
Platelets: 303 10*3/uL (ref 145–400)
RBC: 4.38 10*6/uL (ref 3.70–5.32)
RDW: 16.7 % — ABNORMAL HIGH (ref 11.3–14.5)
WBC: 9.3 10*3/uL (ref 3.9–10.0)

## 2006-10-30 LAB — PROTIME-INR

## 2006-11-04 LAB — PROTIME-INR
INR: 2.5 (ref 2.00–3.50)
Protime: 30 Seconds — ABNORMAL HIGH (ref 10.6–13.4)

## 2006-11-11 ENCOUNTER — Ambulatory Visit (HOSPITAL_COMMUNITY): Admission: RE | Admit: 2006-11-11 | Discharge: 2006-11-11 | Payer: Self-pay | Admitting: Oncology

## 2006-11-12 LAB — COMPREHENSIVE METABOLIC PANEL
Albumin: 3.8 g/dL (ref 3.5–5.2)
Alkaline Phosphatase: 124 U/L — ABNORMAL HIGH (ref 39–117)
BUN: 13 mg/dL (ref 6–23)
Creatinine, Ser: 0.73 mg/dL (ref 0.40–1.20)
Glucose, Bld: 118 mg/dL — ABNORMAL HIGH (ref 70–99)
Potassium: 3.8 mEq/L (ref 3.5–5.3)

## 2006-11-12 LAB — PROTIME-INR: Protime: 27.6 Seconds — ABNORMAL HIGH (ref 10.6–13.4)

## 2006-11-12 LAB — CBC WITH DIFFERENTIAL/PLATELET
BASO%: 0.6 % (ref 0.0–2.0)
EOS%: 4.4 % (ref 0.0–7.0)
Eosinophils Absolute: 0.3 10*3/uL (ref 0.0–0.5)
LYMPH%: 21.4 % (ref 14.0–48.0)
MCH: 28.8 pg (ref 26.0–34.0)
MCHC: 34.8 g/dL (ref 32.0–36.0)
MCV: 82.8 fL (ref 81.0–101.0)
MONO%: 6.5 % (ref 0.0–13.0)
Platelets: 247 10*3/uL (ref 145–400)
RBC: 4.08 10*6/uL (ref 3.70–5.32)
RDW: 17.1 % — ABNORMAL HIGH (ref 11.3–14.5)

## 2006-11-13 ENCOUNTER — Ambulatory Visit (HOSPITAL_COMMUNITY): Admission: RE | Admit: 2006-11-13 | Discharge: 2006-11-13 | Payer: Self-pay | Admitting: Oncology

## 2006-11-21 LAB — PROTIME-INR

## 2006-11-27 LAB — CBC WITH DIFFERENTIAL/PLATELET
Eosinophils Absolute: 0.2 10*3/uL (ref 0.0–0.5)
HCT: 36.1 % (ref 34.8–46.6)
HGB: 12.6 g/dL (ref 11.6–15.9)
LYMPH%: 21.9 % (ref 14.0–48.0)
MONO#: 0.6 10*3/uL (ref 0.1–0.9)
NEUT#: 5.3 10*3/uL (ref 1.5–6.5)
NEUT%: 67.7 % (ref 39.6–76.8)
Platelets: 286 10*3/uL (ref 145–400)
WBC: 7.9 10*3/uL (ref 3.9–10.0)
lymph#: 1.7 10*3/uL (ref 0.9–3.3)

## 2006-11-27 LAB — PROTIME-INR

## 2006-11-27 LAB — COMPREHENSIVE METABOLIC PANEL
ALT: 13 U/L (ref 0–35)
Albumin: 4 g/dL (ref 3.5–5.2)
Alkaline Phosphatase: 137 U/L — ABNORMAL HIGH (ref 39–117)
CO2: 28 mEq/L (ref 19–32)
Glucose, Bld: 109 mg/dL — ABNORMAL HIGH (ref 70–99)
Potassium: 3.9 mEq/L (ref 3.5–5.3)
Sodium: 142 mEq/L (ref 135–145)
Total Bilirubin: 0.6 mg/dL (ref 0.3–1.2)
Total Protein: 8.1 g/dL (ref 6.0–8.3)

## 2006-11-27 LAB — MAGNESIUM: Magnesium: 2.1 mg/dL (ref 1.5–2.5)

## 2006-11-27 LAB — LACTATE DEHYDROGENASE: LDH: 198 U/L (ref 94–250)

## 2006-12-02 ENCOUNTER — Ambulatory Visit: Payer: Self-pay | Admitting: Oncology

## 2006-12-04 LAB — PROTIME-INR
INR: 3.2 (ref 2.00–3.50)
Protime: 38.4 Seconds — ABNORMAL HIGH (ref 10.6–13.4)

## 2006-12-04 LAB — CBC WITH DIFFERENTIAL/PLATELET
EOS%: 3.6 % (ref 0.0–7.0)
Eosinophils Absolute: 0.3 10*3/uL (ref 0.0–0.5)
HGB: 12.8 g/dL (ref 11.6–15.9)
MCH: 29.1 pg (ref 26.0–34.0)
MCV: 81.9 fL (ref 81.0–101.0)
MONO%: 8.7 % (ref 0.0–13.0)
NEUT#: 5.1 10*3/uL (ref 1.5–6.5)
RBC: 4.39 10*6/uL (ref 3.70–5.32)
RDW: 14.6 % — ABNORMAL HIGH (ref 11.3–14.5)
lymph#: 2 10*3/uL (ref 0.9–3.3)

## 2006-12-11 LAB — CBC WITH DIFFERENTIAL/PLATELET
BASO%: 0.5 % (ref 0.0–2.0)
EOS%: 3.8 % (ref 0.0–7.0)
MCH: 28.9 pg (ref 26.0–34.0)
MCHC: 35 g/dL (ref 32.0–36.0)
MCV: 82.6 fL (ref 81.0–101.0)
MONO%: 9 % (ref 0.0–13.0)
RBC: 4.37 10*6/uL (ref 3.70–5.32)
RDW: 15.2 % — ABNORMAL HIGH (ref 11.3–14.5)
lymph#: 1.5 10*3/uL (ref 0.9–3.3)

## 2006-12-11 LAB — PROTIME-INR
INR: 2.6 (ref 2.00–3.50)
Protime: 31.2 Seconds — ABNORMAL HIGH (ref 10.6–13.4)

## 2006-12-18 LAB — CBC WITH DIFFERENTIAL/PLATELET
BASO%: 0.8 % (ref 0.0–2.0)
Basophils Absolute: 0.1 10*3/uL (ref 0.0–0.1)
EOS%: 2.4 % (ref 0.0–7.0)
HGB: 12.6 g/dL (ref 11.6–15.9)
MCH: 29.7 pg (ref 26.0–34.0)
MCHC: 35.8 g/dL (ref 32.0–36.0)
MCV: 83 fL (ref 81.0–101.0)
MONO%: 7.6 % (ref 0.0–13.0)
RBC: 4.23 10*6/uL (ref 3.70–5.32)
RDW: 15.2 % — ABNORMAL HIGH (ref 11.3–14.5)

## 2006-12-25 LAB — CBC WITH DIFFERENTIAL/PLATELET
Basophils Absolute: 0.1 10*3/uL (ref 0.0–0.1)
Eosinophils Absolute: 0.1 10*3/uL (ref 0.0–0.5)
HCT: 34.5 % — ABNORMAL LOW (ref 34.8–46.6)
HGB: 12.1 g/dL (ref 11.6–15.9)
LYMPH%: 20.8 % (ref 14.0–48.0)
MCV: 85.1 fL (ref 81.0–101.0)
MONO#: 0.4 10*3/uL (ref 0.1–0.9)
NEUT#: 5 10*3/uL (ref 1.5–6.5)
NEUT%: 70 % (ref 39.6–76.8)
Platelets: 250 10*3/uL (ref 145–400)
RBC: 4.06 10*6/uL (ref 3.70–5.32)
WBC: 7.1 10*3/uL (ref 3.9–10.0)

## 2006-12-25 LAB — PROTIME-INR
INR: 2.2 (ref 2.00–3.50)
Protime: 26.4 Seconds — ABNORMAL HIGH (ref 10.6–13.4)

## 2006-12-25 LAB — COMPREHENSIVE METABOLIC PANEL
BUN: 11 mg/dL (ref 6–23)
CO2: 25 mEq/L (ref 19–32)
Glucose, Bld: 89 mg/dL (ref 70–99)
Sodium: 139 mEq/L (ref 135–145)
Total Bilirubin: 0.6 mg/dL (ref 0.3–1.2)
Total Protein: 7.6 g/dL (ref 6.0–8.3)

## 2006-12-25 LAB — CANCER ANTIGEN 27.29: CA 27.29: 31 U/mL (ref 0–39)

## 2006-12-25 LAB — MAGNESIUM: Magnesium: 1.8 mg/dL (ref 1.5–2.5)

## 2007-01-01 LAB — CBC WITH DIFFERENTIAL/PLATELET
Basophils Absolute: 0.1 10*3/uL (ref 0.0–0.1)
EOS%: 2.3 % (ref 0.0–7.0)
HGB: 13.4 g/dL (ref 11.6–15.9)
LYMPH%: 25.7 % (ref 14.0–48.0)
MCH: 28.9 pg (ref 26.0–34.0)
MCV: 82.8 fL (ref 81.0–101.0)
MONO%: 6.2 % (ref 0.0–13.0)
NEUT%: 64.9 % (ref 39.6–76.8)
Platelets: 272 10*3/uL (ref 145–400)
RDW: 15 % — ABNORMAL HIGH (ref 11.3–14.5)

## 2007-01-01 LAB — PROTIME-INR: INR: 2.3 (ref 2.00–3.50)

## 2007-01-07 ENCOUNTER — Encounter (INDEPENDENT_AMBULATORY_CARE_PROVIDER_SITE_OTHER): Payer: Self-pay | Admitting: Cardiovascular Disease

## 2007-01-07 ENCOUNTER — Ambulatory Visit: Admission: RE | Admit: 2007-01-07 | Discharge: 2007-01-07 | Payer: Self-pay | Admitting: Oncology

## 2007-01-07 LAB — CBC WITH DIFFERENTIAL/PLATELET
Basophils Absolute: 0.1 10*3/uL (ref 0.0–0.1)
EOS%: 3.1 % (ref 0.0–7.0)
Eosinophils Absolute: 0.2 10*3/uL (ref 0.0–0.5)
HCT: 34 % — ABNORMAL LOW (ref 34.8–46.6)
HGB: 11.8 g/dL (ref 11.6–15.9)
LYMPH%: 31.5 % (ref 14.0–48.0)
MCH: 29.2 pg (ref 26.0–34.0)
MCV: 84.1 fL (ref 81.0–101.0)
MONO%: 7.1 % (ref 0.0–13.0)
NEUT%: 57 % (ref 39.6–76.8)
Platelets: 251 10*3/uL (ref 145–400)

## 2007-01-08 ENCOUNTER — Ambulatory Visit (HOSPITAL_COMMUNITY): Admission: RE | Admit: 2007-01-08 | Discharge: 2007-01-08 | Payer: Self-pay | Admitting: Oncology

## 2007-01-15 LAB — CBC WITH DIFFERENTIAL/PLATELET
Basophils Absolute: 0.1 10*3/uL (ref 0.0–0.1)
EOS%: 4.4 % (ref 0.0–7.0)
Eosinophils Absolute: 0.3 10*3/uL (ref 0.0–0.5)
HCT: 34.3 % — ABNORMAL LOW (ref 34.8–46.6)
HGB: 12.1 g/dL (ref 11.6–15.9)
MCH: 29.8 pg (ref 26.0–34.0)
MCV: 84.7 fL (ref 81.0–101.0)
MONO%: 9 % (ref 0.0–13.0)
NEUT#: 3.8 10*3/uL (ref 1.5–6.5)
NEUT%: 57.4 % (ref 39.6–76.8)

## 2007-01-17 ENCOUNTER — Ambulatory Visit: Payer: Self-pay | Admitting: Oncology

## 2007-01-22 LAB — COMPREHENSIVE METABOLIC PANEL
ALT: 13 U/L (ref 0–35)
CO2: 23 mEq/L (ref 19–32)
Calcium: 8.4 mg/dL (ref 8.4–10.5)
Chloride: 104 mEq/L (ref 96–112)
Creatinine, Ser: 0.78 mg/dL (ref 0.40–1.20)
Glucose, Bld: 92 mg/dL (ref 70–99)

## 2007-01-22 LAB — CANCER ANTIGEN 27.29: CA 27.29: 25 U/mL (ref 0–39)

## 2007-01-22 LAB — CBC WITH DIFFERENTIAL/PLATELET
Basophils Absolute: 0.1 10*3/uL (ref 0.0–0.1)
EOS%: 0.9 % (ref 0.0–7.0)
HCT: 33.8 % — ABNORMAL LOW (ref 34.8–46.6)
HGB: 11.9 g/dL (ref 11.6–15.9)
LYMPH%: 6.8 % — ABNORMAL LOW (ref 14.0–48.0)
MCH: 30.1 pg (ref 26.0–34.0)
MCHC: 35.2 g/dL (ref 32.0–36.0)
MCV: 85.6 fL (ref 81.0–101.0)
MONO%: 3.6 % (ref 0.0–13.0)
NEUT%: 88.3 % — ABNORMAL HIGH (ref 39.6–76.8)
Platelets: 192 10*3/uL (ref 145–400)
lymph#: 1.1 10*3/uL (ref 0.9–3.3)

## 2007-01-22 LAB — PROTIME-INR

## 2007-01-22 LAB — LACTATE DEHYDROGENASE: LDH: 208 U/L (ref 94–250)

## 2007-02-06 LAB — CBC WITH DIFFERENTIAL/PLATELET
Eosinophils Absolute: 0.2 10*3/uL (ref 0.0–0.5)
MONO#: 0.7 10*3/uL (ref 0.1–0.9)
NEUT#: 6.9 10*3/uL — ABNORMAL HIGH (ref 1.5–6.5)
RBC: 3.91 10*6/uL (ref 3.70–5.32)
RDW: 14.5 % (ref 11.3–14.5)
WBC: 10 10*3/uL (ref 3.9–10.0)
lymph#: 2 10*3/uL (ref 0.9–3.3)

## 2007-02-06 LAB — PROTIME-INR
INR: 1.3 — ABNORMAL LOW (ref 2.00–3.50)
Protime: 15.6 Seconds — ABNORMAL HIGH (ref 10.6–13.4)

## 2007-02-20 LAB — CBC WITH DIFFERENTIAL/PLATELET
BASO%: 1.1 % (ref 0.0–2.0)
Eosinophils Absolute: 0.3 10*3/uL (ref 0.0–0.5)
MCHC: 35 g/dL (ref 32.0–36.0)
MCV: 84.9 fL (ref 81.0–101.0)
MONO#: 0.4 10*3/uL (ref 0.1–0.9)
MONO%: 6.7 % (ref 0.0–13.0)
NEUT#: 3.9 10*3/uL (ref 1.5–6.5)
RBC: 4.23 10*6/uL (ref 3.70–5.32)
RDW: 17.8 % — ABNORMAL HIGH (ref 11.3–14.5)
WBC: 6.1 10*3/uL (ref 3.9–10.0)

## 2007-02-20 LAB — LACTATE DEHYDROGENASE: LDH: 195 U/L (ref 94–250)

## 2007-02-20 LAB — COMPREHENSIVE METABOLIC PANEL
AST: 15 U/L (ref 0–37)
BUN: 8 mg/dL (ref 6–23)
CO2: 28 mEq/L (ref 19–32)
Calcium: 9.7 mg/dL (ref 8.4–10.5)
Chloride: 102 mEq/L (ref 96–112)
Creatinine, Ser: 0.72 mg/dL (ref 0.40–1.20)
Potassium: 4 mEq/L (ref 3.5–5.3)
Sodium: 138 mEq/L (ref 135–145)
Total Bilirubin: 1 mg/dL (ref 0.3–1.2)

## 2007-02-20 LAB — CANCER ANTIGEN 27.29: CA 27.29: 30 U/mL (ref 0–39)

## 2007-02-20 LAB — PROTIME-INR: Protime: 14.4 Seconds — ABNORMAL HIGH (ref 10.6–13.4)

## 2007-03-03 ENCOUNTER — Ambulatory Visit: Payer: Self-pay | Admitting: Oncology

## 2007-03-06 LAB — PROTIME-INR
INR: 1.6 — ABNORMAL LOW (ref 2.00–3.50)
Protime: 19.2 Seconds — ABNORMAL HIGH (ref 10.6–13.4)

## 2007-03-21 LAB — CBC WITH DIFFERENTIAL/PLATELET
BASO%: 1 % (ref 0.0–2.0)
EOS%: 3.5 % (ref 0.0–7.0)
LYMPH%: 22.4 % (ref 14.0–48.0)
MCHC: 34.7 g/dL (ref 32.0–36.0)
MCV: 85.4 fL (ref 81.0–101.0)
MONO%: 6.8 % (ref 0.0–13.0)
Platelets: 239 10*3/uL (ref 145–400)
RBC: 4.03 10*6/uL (ref 3.70–5.32)
RDW: 17.9 % — ABNORMAL HIGH (ref 11.3–14.5)
WBC: 6.7 10*3/uL (ref 3.9–10.0)

## 2007-03-21 LAB — COMPREHENSIVE METABOLIC PANEL
ALT: 9 U/L (ref 0–35)
AST: 11 U/L (ref 0–37)
Alkaline Phosphatase: 100 U/L (ref 39–117)
Potassium: 4 mEq/L (ref 3.5–5.3)
Sodium: 141 mEq/L (ref 135–145)
Total Bilirubin: 0.5 mg/dL (ref 0.3–1.2)
Total Protein: 7.4 g/dL (ref 6.0–8.3)

## 2007-03-21 LAB — PROTHROMBIN TIME
INR: 1.7 — ABNORMAL HIGH (ref 0.0–1.5)
Prothrombin Time: 20.5 seconds — ABNORMAL HIGH (ref 11.6–15.2)

## 2007-03-21 LAB — LACTATE DEHYDROGENASE: LDH: 168 U/L (ref 94–250)

## 2007-04-04 ENCOUNTER — Ambulatory Visit (HOSPITAL_COMMUNITY): Admission: RE | Admit: 2007-04-04 | Discharge: 2007-04-04 | Payer: Self-pay | Admitting: Oncology

## 2007-04-04 LAB — PROTIME-INR: Protime: 19.2 Seconds — ABNORMAL HIGH (ref 10.6–13.4)

## 2007-04-16 LAB — COMPREHENSIVE METABOLIC PANEL
ALT: 10 U/L (ref 0–35)
AST: 14 U/L (ref 0–37)
Alkaline Phosphatase: 105 U/L (ref 39–117)
Creatinine, Ser: 0.77 mg/dL (ref 0.40–1.20)
Sodium: 139 mEq/L (ref 135–145)
Total Bilirubin: 0.7 mg/dL (ref 0.3–1.2)
Total Protein: 7.2 g/dL (ref 6.0–8.3)

## 2007-04-16 LAB — PROTHROMBIN TIME: INR: 1.5 (ref 0.0–1.5)

## 2007-04-16 LAB — CBC WITH DIFFERENTIAL/PLATELET
BASO%: 2.5 % — ABNORMAL HIGH (ref 0.0–2.0)
EOS%: 3.6 % (ref 0.0–7.0)
HCT: 33 % — ABNORMAL LOW (ref 34.8–46.6)
LYMPH%: 28.3 % (ref 14.0–48.0)
MCH: 29.4 pg (ref 26.0–34.0)
MCHC: 34.9 g/dL (ref 32.0–36.0)
NEUT%: 58.9 % (ref 39.6–76.8)
Platelets: 271 10*3/uL (ref 145–400)
RBC: 3.93 10*6/uL (ref 3.70–5.32)
WBC: 8.4 10*3/uL (ref 3.9–10.0)

## 2007-04-16 LAB — CANCER ANTIGEN 27.29: CA 27.29: 19 U/mL (ref 0–39)

## 2007-04-16 LAB — LACTATE DEHYDROGENASE: LDH: 215 U/L (ref 94–250)

## 2007-04-27 ENCOUNTER — Ambulatory Visit: Payer: Self-pay | Admitting: Oncology

## 2007-05-01 LAB — PROTIME-INR: Protime: 18 Seconds — ABNORMAL HIGH (ref 10.6–13.4)

## 2007-05-08 LAB — COMPREHENSIVE METABOLIC PANEL
ALT: 11 U/L (ref 0–35)
Albumin: 3.9 g/dL (ref 3.5–5.2)
CO2: 27 mEq/L (ref 19–32)
Chloride: 103 mEq/L (ref 96–112)
Glucose, Bld: 84 mg/dL (ref 70–99)
Potassium: 4 mEq/L (ref 3.5–5.3)
Sodium: 143 mEq/L (ref 135–145)
Total Protein: 7.5 g/dL (ref 6.0–8.3)

## 2007-05-08 LAB — LACTATE DEHYDROGENASE: LDH: 199 U/L (ref 94–250)

## 2007-05-08 LAB — CBC WITH DIFFERENTIAL/PLATELET
Eosinophils Absolute: 0.3 10*3/uL (ref 0.0–0.5)
LYMPH%: 35.6 % (ref 14.0–48.0)
MCV: 82.2 fL (ref 81.0–101.0)
MONO%: 5.2 % (ref 0.0–13.0)
NEUT#: 3.8 10*3/uL (ref 1.5–6.5)
Platelets: 271 10*3/uL (ref 145–400)
RBC: 4.08 10*6/uL (ref 3.70–5.32)

## 2007-05-08 LAB — PROTIME-INR

## 2007-05-08 LAB — CANCER ANTIGEN 27.29: CA 27.29: 28 U/mL (ref 0–39)

## 2007-05-09 ENCOUNTER — Encounter: Payer: Self-pay | Admitting: Oncology

## 2007-05-09 ENCOUNTER — Ambulatory Visit: Payer: Self-pay

## 2007-05-15 LAB — PROTIME-INR

## 2007-05-29 LAB — PROTIME-INR

## 2007-06-06 LAB — COMPREHENSIVE METABOLIC PANEL
AST: 14 U/L (ref 0–37)
Alkaline Phosphatase: 109 U/L (ref 39–117)
BUN: 10 mg/dL (ref 6–23)
Creatinine, Ser: 0.86 mg/dL (ref 0.40–1.20)
Total Bilirubin: 0.5 mg/dL (ref 0.3–1.2)

## 2007-06-06 LAB — CBC WITH DIFFERENTIAL/PLATELET
Basophils Absolute: 0 10*3/uL (ref 0.0–0.1)
EOS%: 5.2 % (ref 0.0–7.0)
HCT: 34 % — ABNORMAL LOW (ref 34.8–46.6)
HGB: 12 g/dL (ref 11.6–15.9)
MCH: 29.4 pg (ref 26.0–34.0)
MCV: 83.5 fL (ref 81.0–101.0)
MONO%: 7.8 % (ref 0.0–13.0)
NEUT%: 51.3 % (ref 39.6–76.8)
RDW: 17.4 % — ABNORMAL HIGH (ref 11.3–14.5)

## 2007-06-06 LAB — LACTATE DEHYDROGENASE: LDH: 184 U/L (ref 94–250)

## 2007-06-10 ENCOUNTER — Ambulatory Visit: Payer: Self-pay | Admitting: Oncology

## 2007-06-13 LAB — COMPREHENSIVE METABOLIC PANEL
ALT: 9 U/L (ref 0–35)
AST: 11 U/L (ref 0–37)
Albumin: 3.6 g/dL (ref 3.5–5.2)
Alkaline Phosphatase: 100 U/L (ref 39–117)
BUN: 10 mg/dL (ref 6–23)
Potassium: 3.8 mEq/L (ref 3.5–5.3)

## 2007-06-13 LAB — CBC WITH DIFFERENTIAL/PLATELET
Eosinophils Absolute: 0.3 10*3/uL (ref 0.0–0.5)
HGB: 11.3 g/dL — ABNORMAL LOW (ref 11.6–15.9)
MCV: 83.8 fL (ref 81.0–101.0)
MONO#: 0.6 10*3/uL (ref 0.1–0.9)
MONO%: 7.5 % (ref 0.0–13.0)
NEUT#: 5.2 10*3/uL (ref 1.5–6.5)
RBC: 3.87 10*6/uL (ref 3.70–5.32)
RDW: 18 % — ABNORMAL HIGH (ref 11.3–14.5)
WBC: 7.7 10*3/uL (ref 3.9–10.0)
lymph#: 1.5 10*3/uL (ref 0.9–3.3)

## 2007-06-13 LAB — PROTIME-INR
INR: 1.2 — ABNORMAL LOW (ref 2.00–3.50)
Protime: 14.4 Seconds — ABNORMAL HIGH (ref 10.6–13.4)

## 2007-07-02 LAB — COMPREHENSIVE METABOLIC PANEL
Albumin: 3.7 g/dL (ref 3.5–5.2)
CO2: 25 mEq/L (ref 19–32)
Chloride: 101 mEq/L (ref 96–112)
Glucose, Bld: 93 mg/dL (ref 70–99)
Potassium: 3.6 mEq/L (ref 3.5–5.3)
Sodium: 139 mEq/L (ref 135–145)
Total Protein: 7.6 g/dL (ref 6.0–8.3)

## 2007-07-02 LAB — CBC WITH DIFFERENTIAL/PLATELET
Eosinophils Absolute: 0.4 10*3/uL (ref 0.0–0.5)
HCT: 34 % — ABNORMAL LOW (ref 34.8–46.6)
LYMPH%: 24 % (ref 14.0–48.0)
MCHC: 34.7 g/dL (ref 32.0–36.0)
MONO#: 0.6 10*3/uL (ref 0.1–0.9)
NEUT%: 64.1 % (ref 39.6–76.8)
Platelets: 265 10*3/uL (ref 145–400)
WBC: 8.8 10*3/uL (ref 3.9–10.0)

## 2007-07-16 LAB — COMPREHENSIVE METABOLIC PANEL
AST: 14 U/L (ref 0–37)
Alkaline Phosphatase: 116 U/L (ref 39–117)
BUN: 10 mg/dL (ref 6–23)
Calcium: 8.5 mg/dL (ref 8.4–10.5)
Creatinine, Ser: 0.81 mg/dL (ref 0.40–1.20)
Glucose, Bld: 128 mg/dL — ABNORMAL HIGH (ref 70–99)

## 2007-07-16 LAB — CBC WITH DIFFERENTIAL/PLATELET
Basophils Absolute: 0.1 10*3/uL (ref 0.0–0.1)
EOS%: 3.7 % (ref 0.0–7.0)
Eosinophils Absolute: 0.3 10*3/uL (ref 0.0–0.5)
HCT: 33.5 % — ABNORMAL LOW (ref 34.8–46.6)
HGB: 11.8 g/dL (ref 11.6–15.9)
MCH: 29.1 pg (ref 26.0–34.0)
MCV: 82.3 fL (ref 81.0–101.0)
MONO%: 5.9 % (ref 0.0–13.0)
NEUT%: 68.9 % (ref 39.6–76.8)
Platelets: 267 10*3/uL (ref 145–400)

## 2007-07-16 LAB — PROTIME-INR
INR: 2.6 (ref 2.00–3.50)
Protime: 31.2 Seconds — ABNORMAL HIGH (ref 10.6–13.4)

## 2007-07-28 ENCOUNTER — Ambulatory Visit: Payer: Self-pay | Admitting: Oncology

## 2007-07-30 LAB — PROTIME-INR: INR: 2.7 (ref 2.00–3.50)

## 2007-07-30 LAB — CBC WITH DIFFERENTIAL/PLATELET
Basophils Absolute: 0.1 10*3/uL (ref 0.0–0.1)
EOS%: 3.9 % (ref 0.0–7.0)
HGB: 11.9 g/dL (ref 11.6–15.9)
MCH: 28.7 pg (ref 26.0–34.0)
MCV: 81 fL (ref 81.0–101.0)
MONO%: 7 % (ref 0.0–13.0)
NEUT#: 5.4 10*3/uL (ref 1.5–6.5)
RBC: 4.16 10*6/uL (ref 3.70–5.32)
RDW: 14 % (ref 11.3–14.5)
lymph#: 3 10*3/uL (ref 0.9–3.3)

## 2007-07-30 LAB — COMPREHENSIVE METABOLIC PANEL
AST: 12 U/L (ref 0–37)
Albumin: 3.7 g/dL (ref 3.5–5.2)
BUN: 10 mg/dL (ref 6–23)
Calcium: 8.6 mg/dL (ref 8.4–10.5)
Chloride: 102 mEq/L (ref 96–112)
Glucose, Bld: 95 mg/dL (ref 70–99)
Potassium: 3.3 mEq/L — ABNORMAL LOW (ref 3.5–5.3)
Sodium: 139 mEq/L (ref 135–145)
Total Protein: 7.7 g/dL (ref 6.0–8.3)

## 2007-08-13 LAB — CBC WITH DIFFERENTIAL/PLATELET
BASO%: 0.9 % (ref 0.0–2.0)
Basophils Absolute: 0.1 10*3/uL (ref 0.0–0.1)
EOS%: 2.1 % (ref 0.0–7.0)
HGB: 12.3 g/dL (ref 11.6–15.9)
MCH: 28.3 pg (ref 26.0–34.0)
MONO%: 6.9 % (ref 0.0–13.0)
RBC: 4.35 10*6/uL (ref 3.70–5.32)
RDW: 13.9 % (ref 11.3–14.5)
lymph#: 2.2 10*3/uL (ref 0.9–3.3)

## 2007-08-13 LAB — PROTIME-INR
INR: 3.5 (ref 2.00–3.50)
Protime: 42 Seconds — ABNORMAL HIGH (ref 10.6–13.4)

## 2007-08-13 LAB — COMPREHENSIVE METABOLIC PANEL
ALT: 9 U/L (ref 0–35)
AST: 12 U/L (ref 0–37)
Alkaline Phosphatase: 107 U/L (ref 39–117)
BUN: 10 mg/dL (ref 6–23)
Creatinine, Ser: 0.72 mg/dL (ref 0.40–1.20)
Total Bilirubin: 0.6 mg/dL (ref 0.3–1.2)

## 2007-08-27 LAB — COMPREHENSIVE METABOLIC PANEL
CO2: 22 mEq/L (ref 19–32)
Calcium: 8.9 mg/dL (ref 8.4–10.5)
Chloride: 105 mEq/L (ref 96–112)
Creatinine, Ser: 0.7 mg/dL (ref 0.40–1.20)
Glucose, Bld: 100 mg/dL — ABNORMAL HIGH (ref 70–99)
Total Bilirubin: 0.8 mg/dL (ref 0.3–1.2)

## 2007-08-27 LAB — CBC WITH DIFFERENTIAL/PLATELET
BASO%: 0.5 % (ref 0.0–2.0)
Eosinophils Absolute: 0.3 10*3/uL (ref 0.0–0.5)
HCT: 36.4 % (ref 34.8–46.6)
MCHC: 35.1 g/dL (ref 32.0–36.0)
MONO#: 0.5 10*3/uL (ref 0.1–0.9)
NEUT#: 6.9 10*3/uL — ABNORMAL HIGH (ref 1.5–6.5)
RBC: 4.43 10*6/uL (ref 3.70–5.32)
WBC: 9.8 10*3/uL (ref 3.9–10.0)
lymph#: 2.1 10*3/uL (ref 0.9–3.3)

## 2007-08-27 LAB — PROTIME-INR: Protime: 27.6 Seconds — ABNORMAL HIGH (ref 10.6–13.4)

## 2007-08-27 LAB — CANCER ANTIGEN 27.29: CA 27.29: 27 U/mL (ref 0–39)

## 2007-09-10 ENCOUNTER — Ambulatory Visit: Payer: Self-pay | Admitting: Oncology

## 2007-09-12 LAB — COMPREHENSIVE METABOLIC PANEL
ALT: 11 U/L (ref 0–35)
AST: 13 U/L (ref 0–37)
Albumin: 3.8 g/dL (ref 3.5–5.2)
Calcium: 8.8 mg/dL (ref 8.4–10.5)
Chloride: 104 mEq/L (ref 96–112)
Potassium: 3.9 mEq/L (ref 3.5–5.3)

## 2007-09-12 LAB — CBC WITH DIFFERENTIAL/PLATELET
BASO%: 0.5 % (ref 0.0–2.0)
MCHC: 33.2 g/dL (ref 32.0–36.0)
MONO#: 0.6 10*3/uL (ref 0.1–0.9)
NEUT#: 7.1 10*3/uL — ABNORMAL HIGH (ref 1.5–6.5)
RBC: 4.36 10*6/uL (ref 3.70–5.32)
RDW: 13.8 % (ref 11.3–14.5)
WBC: 10 10*3/uL (ref 3.9–10.0)
lymph#: 2 10*3/uL (ref 0.9–3.3)

## 2007-09-12 LAB — PROTIME-INR: INR: 2.9 (ref 2.00–3.50)

## 2007-09-26 LAB — COMPREHENSIVE METABOLIC PANEL
Alkaline Phosphatase: 101 U/L (ref 39–117)
BUN: 9 mg/dL (ref 6–23)
Creatinine, Ser: 0.72 mg/dL (ref 0.40–1.20)
Glucose, Bld: 113 mg/dL — ABNORMAL HIGH (ref 70–99)
Total Bilirubin: 0.9 mg/dL (ref 0.3–1.2)

## 2007-09-26 LAB — CBC WITH DIFFERENTIAL/PLATELET
Basophils Absolute: 0.1 10*3/uL (ref 0.0–0.1)
Eosinophils Absolute: 0.3 10*3/uL (ref 0.0–0.5)
HCT: 33.7 % — ABNORMAL LOW (ref 34.8–46.6)
HGB: 11.7 g/dL (ref 11.6–15.9)
LYMPH%: 20.2 % (ref 14.0–48.0)
MCV: 81.3 fL (ref 81.0–101.0)
MONO%: 5.1 % (ref 0.0–13.0)
NEUT#: 6.8 10*3/uL — ABNORMAL HIGH (ref 1.5–6.5)
Platelets: 285 10*3/uL (ref 145–400)
RDW: 13.9 % (ref 11.3–14.5)

## 2007-10-10 ENCOUNTER — Ambulatory Visit: Payer: Self-pay | Admitting: Oncology

## 2007-10-10 ENCOUNTER — Ambulatory Visit (HOSPITAL_COMMUNITY): Admission: RE | Admit: 2007-10-10 | Discharge: 2007-10-10 | Payer: Self-pay | Admitting: Oncology

## 2007-10-10 LAB — CBC WITH DIFFERENTIAL/PLATELET
Basophils Absolute: 0 10*3/uL (ref 0.0–0.1)
Eosinophils Absolute: 0.4 10*3/uL (ref 0.0–0.5)
HGB: 11.5 g/dL — ABNORMAL LOW (ref 11.6–15.9)
MONO#: 0.5 10*3/uL (ref 0.1–0.9)
MONO%: 8.3 % (ref 0.0–13.0)
NEUT#: 3.4 10*3/uL (ref 1.5–6.5)
RBC: 4.22 10*6/uL (ref 3.70–5.32)
RDW: 13.9 % (ref 11.3–14.5)
WBC: 6.5 10*3/uL (ref 3.9–10.0)
lymph#: 2.1 10*3/uL (ref 0.9–3.3)

## 2007-10-10 LAB — COMPREHENSIVE METABOLIC PANEL
ALT: 10 U/L (ref 0–35)
AST: 12 U/L (ref 0–37)
Albumin: 3.7 g/dL (ref 3.5–5.2)
Alkaline Phosphatase: 97 U/L (ref 39–117)
BUN: 8 mg/dL (ref 6–23)
Potassium: 3.6 mEq/L (ref 3.5–5.3)
Sodium: 139 mEq/L (ref 135–145)

## 2007-10-10 LAB — PROTIME-INR
INR: 4.1 — ABNORMAL HIGH (ref 2.00–3.50)
Protime: 49.2 Seconds — ABNORMAL HIGH (ref 10.6–13.4)

## 2007-10-16 ENCOUNTER — Ambulatory Visit (HOSPITAL_COMMUNITY): Admission: RE | Admit: 2007-10-16 | Discharge: 2007-10-16 | Payer: Self-pay | Admitting: Oncology

## 2007-10-21 ENCOUNTER — Ambulatory Visit (HOSPITAL_COMMUNITY): Admission: RE | Admit: 2007-10-21 | Discharge: 2007-10-21 | Payer: Self-pay | Admitting: Oncology

## 2007-10-23 LAB — CBC WITH DIFFERENTIAL/PLATELET
Basophils Absolute: 0 10*3/uL (ref 0.0–0.1)
Eosinophils Absolute: 0.3 10*3/uL (ref 0.0–0.5)
HGB: 11.8 g/dL (ref 11.6–15.9)
MCV: 82.8 fL (ref 81.0–101.0)
NEUT#: 3.1 10*3/uL (ref 1.5–6.5)
RDW: 16.4 % — ABNORMAL HIGH (ref 11.3–14.5)
lymph#: 2.4 10*3/uL (ref 0.9–3.3)

## 2007-10-23 LAB — PROTIME-INR: INR: 1.6 — ABNORMAL LOW (ref 2.00–3.50)

## 2007-11-07 LAB — CBC WITH DIFFERENTIAL/PLATELET
BASO%: 2.4 % — ABNORMAL HIGH (ref 0.0–2.0)
EOS%: 4.8 % (ref 0.0–7.0)
HCT: 33.9 % — ABNORMAL LOW (ref 34.8–46.6)
MCH: 28.3 pg (ref 26.0–34.0)
MCHC: 34.6 g/dL (ref 32.0–36.0)
MCV: 81.8 fL (ref 81.0–101.0)
MONO%: 6.4 % (ref 0.0–13.0)
NEUT%: 62.2 % (ref 39.6–76.8)
RDW: 16.4 % — ABNORMAL HIGH (ref 11.3–14.5)
lymph#: 1.8 10*3/uL (ref 0.9–3.3)

## 2007-11-07 LAB — COMPREHENSIVE METABOLIC PANEL
Albumin: 3.5 g/dL (ref 3.5–5.2)
BUN: 11 mg/dL (ref 6–23)
CO2: 24 mEq/L (ref 19–32)
Calcium: 8.4 mg/dL (ref 8.4–10.5)
Glucose, Bld: 98 mg/dL (ref 70–99)
Potassium: 4.1 mEq/L (ref 3.5–5.3)
Sodium: 139 mEq/L (ref 135–145)
Total Protein: 7.1 g/dL (ref 6.0–8.3)

## 2007-11-07 LAB — PROTIME-INR

## 2007-11-19 ENCOUNTER — Ambulatory Visit: Payer: Self-pay | Admitting: Oncology

## 2007-11-28 LAB — CBC WITH DIFFERENTIAL/PLATELET
BASO%: 0.6 % (ref 0.0–2.0)
Basophils Absolute: 0.1 10*3/uL (ref 0.0–0.1)
EOS%: 0.3 % (ref 0.0–7.0)
HCT: 37.9 % (ref 34.8–46.6)
HGB: 13 g/dL (ref 11.6–15.9)
LYMPH%: 15.9 % (ref 14.0–48.0)
MCH: 27.8 pg (ref 26.0–34.0)
MCHC: 34.3 g/dL (ref 32.0–36.0)
MCV: 81.1 fL (ref 81.0–101.0)
MONO%: 8.4 % (ref 0.0–13.0)
NEUT%: 74.9 % (ref 39.6–76.8)
Platelets: 429 10*3/uL — ABNORMAL HIGH (ref 145–400)

## 2007-11-28 LAB — PROTIME-INR
INR: 1.8 — ABNORMAL LOW (ref 2.00–3.50)
Protime: 21.6 Seconds — ABNORMAL HIGH (ref 10.6–13.4)

## 2007-12-03 LAB — CBC WITH DIFFERENTIAL/PLATELET
BASO%: 0.2 % (ref 0.0–2.0)
EOS%: 0.6 % (ref 0.0–7.0)
MCH: 28.1 pg (ref 26.0–34.0)
MCHC: 34.6 g/dL (ref 32.0–36.0)
NEUT%: 64 % (ref 39.6–76.8)
RBC: 3.8 10*6/uL (ref 3.70–5.32)
RDW: 18 % — ABNORMAL HIGH (ref 11.3–14.5)
lymph#: 1.6 10*3/uL (ref 0.9–3.3)

## 2007-12-03 LAB — PROTIME-INR: INR: 2.4 (ref 2.00–3.50)

## 2007-12-03 LAB — BASIC METABOLIC PANEL
Chloride: 108 mEq/L (ref 96–112)
Creatinine, Ser: 0.81 mg/dL (ref 0.40–1.20)
Potassium: 3.9 mEq/L (ref 3.5–5.3)

## 2007-12-08 ENCOUNTER — Emergency Department (HOSPITAL_COMMUNITY): Admission: EM | Admit: 2007-12-08 | Discharge: 2007-12-08 | Payer: Self-pay | Admitting: Emergency Medicine

## 2007-12-18 LAB — CBC WITH DIFFERENTIAL/PLATELET
BASO%: 0.6 % (ref 0.0–2.0)
Basophils Absolute: 0.1 10*3/uL (ref 0.0–0.1)
EOS%: 0.5 % (ref 0.0–7.0)
Eosinophils Absolute: 0.1 10*3/uL (ref 0.0–0.5)
HCT: 34.1 % — ABNORMAL LOW (ref 34.8–46.6)
HGB: 11.8 g/dL (ref 11.6–15.9)
LYMPH%: 32.9 % (ref 14.0–48.0)
MCH: 28.5 pg (ref 26.0–34.0)
MCHC: 34.5 g/dL (ref 32.0–36.0)
MCV: 82.5 fL (ref 81.0–101.0)
MONO#: 1.4 10*3/uL — ABNORMAL HIGH (ref 0.1–0.9)
MONO%: 13 % (ref 0.0–13.0)
NEUT#: 5.8 10*3/uL (ref 1.5–6.5)
NEUT%: 53 % (ref 39.6–76.8)
Platelets: 322 10*3/uL (ref 145–400)
RBC: 4.13 10*6/uL (ref 3.70–5.32)
RDW: 18.9 % — ABNORMAL HIGH (ref 11.3–14.5)
WBC: 10.9 10*3/uL — ABNORMAL HIGH (ref 3.9–10.0)
lymph#: 3.6 10*3/uL — ABNORMAL HIGH (ref 0.9–3.3)

## 2007-12-18 LAB — PROTIME-INR: INR: 1.4 — ABNORMAL LOW (ref 2.00–3.50)

## 2007-12-18 LAB — COMPREHENSIVE METABOLIC PANEL
Albumin: 4 g/dL (ref 3.5–5.2)
BUN: 8 mg/dL (ref 6–23)
CO2: 28 mEq/L (ref 19–32)
Calcium: 9.5 mg/dL (ref 8.4–10.5)
Chloride: 100 mEq/L (ref 96–112)
Creatinine, Ser: 0.93 mg/dL (ref 0.40–1.20)
Potassium: 3.8 mEq/L (ref 3.5–5.3)

## 2007-12-18 LAB — LACTATE DEHYDROGENASE: LDH: 229 U/L (ref 94–250)

## 2007-12-19 LAB — CELLSEARCH TO QUEST

## 2007-12-26 ENCOUNTER — Ambulatory Visit (HOSPITAL_COMMUNITY): Admission: RE | Admit: 2007-12-26 | Discharge: 2007-12-26 | Payer: Self-pay | Admitting: Oncology

## 2007-12-26 LAB — COMPREHENSIVE METABOLIC PANEL WITH GFR
ALT: 16 U/L (ref 0–35)
AST: 15 U/L (ref 0–37)
Albumin: 4.3 g/dL (ref 3.5–5.2)
Alkaline Phosphatase: 75 U/L (ref 39–117)
BUN: 19 mg/dL (ref 6–23)
CO2: 21 meq/L (ref 19–32)
Calcium: 9 mg/dL (ref 8.4–10.5)
Chloride: 105 meq/L (ref 96–112)
Creatinine, Ser: 1.08 mg/dL (ref 0.40–1.20)
Glucose, Bld: 138 mg/dL — ABNORMAL HIGH (ref 70–99)
Potassium: 4.4 meq/L (ref 3.5–5.3)
Sodium: 140 meq/L (ref 135–145)
Total Bilirubin: 1.1 mg/dL (ref 0.3–1.2)
Total Protein: 7.5 g/dL (ref 6.0–8.3)

## 2007-12-26 LAB — CBC WITH DIFFERENTIAL/PLATELET
BASO%: 2.4 % — ABNORMAL HIGH (ref 0.0–2.0)
Basophils Absolute: 0.1 10e3/uL (ref 0.0–0.1)
EOS%: 1.2 % (ref 0.0–7.0)
Eosinophils Absolute: 0 10e3/uL (ref 0.0–0.5)
HCT: 34.5 % — ABNORMAL LOW (ref 34.8–46.6)
HGB: 11.9 g/dL (ref 11.6–15.9)
LYMPH%: 51.3 % — ABNORMAL HIGH (ref 14.0–48.0)
MCH: 27.8 pg (ref 26.0–34.0)
MCHC: 34.5 g/dL (ref 32.0–36.0)
MCV: 80.6 fL — ABNORMAL LOW (ref 81.0–101.0)
MONO#: 0.1 10e3/uL (ref 0.1–0.9)
MONO%: 4.1 % (ref 0.0–13.0)
NEUT#: 1.2 10e3/uL — ABNORMAL LOW (ref 1.5–6.5)
NEUT%: 41 % (ref 39.6–76.8)
Platelets: 274 10e3/uL (ref 145–400)
RBC: 4.28 10e6/uL (ref 3.70–5.32)
RDW: 15.9 % — ABNORMAL HIGH (ref 11.3–14.5)
WBC: 3 10e3/uL — ABNORMAL LOW (ref 3.9–10.0)
lymph#: 1.5 10e3/uL (ref 0.9–3.3)

## 2007-12-26 LAB — PROTIME-INR
INR: 1.9 — ABNORMAL LOW (ref 2.00–3.50)
Protime: 22.8 Seconds — ABNORMAL HIGH (ref 10.6–13.4)

## 2008-01-07 ENCOUNTER — Ambulatory Visit: Payer: Self-pay | Admitting: Oncology

## 2008-01-09 LAB — COMPREHENSIVE METABOLIC PANEL
Albumin: 3.7 g/dL (ref 3.5–5.2)
Alkaline Phosphatase: 87 U/L (ref 39–117)
BUN: 12 mg/dL (ref 6–23)
CO2: 27 mEq/L (ref 19–32)
Calcium: 8.6 mg/dL (ref 8.4–10.5)
Glucose, Bld: 92 mg/dL (ref 70–99)
Potassium: 3.4 mEq/L — ABNORMAL LOW (ref 3.5–5.3)
Sodium: 144 mEq/L (ref 135–145)
Total Protein: 6.7 g/dL (ref 6.0–8.3)

## 2008-01-09 LAB — LACTATE DEHYDROGENASE: LDH: 196 U/L (ref 94–250)

## 2008-01-09 LAB — CBC WITH DIFFERENTIAL/PLATELET
Basophils Absolute: 0.2 10*3/uL — ABNORMAL HIGH (ref 0.0–0.1)
HCT: 32.5 % — ABNORMAL LOW (ref 34.8–46.6)
HGB: 11.4 g/dL — ABNORMAL LOW (ref 11.6–15.9)
MCH: 28 pg (ref 26.0–34.0)
MONO#: 1.2 10*3/uL — ABNORMAL HIGH (ref 0.1–0.9)
NEUT%: 52.7 % (ref 39.6–76.8)
WBC: 10.1 10*3/uL — ABNORMAL HIGH (ref 3.9–10.0)
lymph#: 3.3 10*3/uL (ref 0.9–3.3)

## 2008-01-09 LAB — PROTIME-INR

## 2008-01-09 LAB — CANCER ANTIGEN 27.29: CA 27.29: 28 U/mL (ref 0–39)

## 2008-01-12 LAB — CELL SEARCH FOR BREAST CANCER

## 2008-01-22 LAB — PROTIME-INR

## 2008-01-29 ENCOUNTER — Ambulatory Visit: Payer: Self-pay | Admitting: Internal Medicine

## 2008-01-29 ENCOUNTER — Encounter: Payer: Self-pay | Admitting: Oncology

## 2008-01-29 ENCOUNTER — Ambulatory Visit: Admission: RE | Admit: 2008-01-29 | Discharge: 2008-01-29 | Payer: Self-pay | Admitting: Oncology

## 2008-01-30 LAB — COMPREHENSIVE METABOLIC PANEL
BUN: 9 mg/dL (ref 6–23)
CO2: 24 mEq/L (ref 19–32)
Calcium: 8.7 mg/dL (ref 8.4–10.5)
Chloride: 104 mEq/L (ref 96–112)
Creatinine, Ser: 0.82 mg/dL (ref 0.40–1.20)
Glucose, Bld: 87 mg/dL (ref 70–99)

## 2008-01-30 LAB — MANUAL DIFFERENTIAL
ALC: 2.5 10*3/uL (ref 0.9–3.3)
ANC (CHCC manual diff): 5.3 10*3/uL (ref 1.5–6.5)
Blasts: 0 % (ref 0–0)
LYMPH: 29 % (ref 14–49)
Metamyelocytes: 0 % (ref 0–0)
Myelocytes: 0 % (ref 0–0)
PROMYELO: 0 % (ref 0–0)
Variant Lymph: 0 % (ref 0–0)

## 2008-01-30 LAB — LACTATE DEHYDROGENASE: LDH: 217 U/L (ref 94–250)

## 2008-01-30 LAB — PROTIME-INR
INR: 1.7 — ABNORMAL LOW (ref 2.00–3.50)
Protime: 20.4 Seconds — ABNORMAL HIGH (ref 10.6–13.4)

## 2008-01-30 LAB — CBC WITH DIFFERENTIAL/PLATELET
HCT: 29.5 % — ABNORMAL LOW (ref 34.8–46.6)
HGB: 10.3 g/dL — ABNORMAL LOW (ref 11.6–15.9)
MCH: 28 pg (ref 26.0–34.0)

## 2008-02-05 LAB — PROTIME-INR

## 2008-02-05 LAB — CBC WITH DIFFERENTIAL/PLATELET
BASO%: 1.3 % (ref 0.0–2.0)
Eosinophils Absolute: 0 10*3/uL (ref 0.0–0.5)
HCT: 31.5 % — ABNORMAL LOW (ref 34.8–46.6)
HGB: 10.8 g/dL — ABNORMAL LOW (ref 11.6–15.9)
MCHC: 34.4 g/dL (ref 32.0–36.0)
MONO#: 0.1 10*3/uL (ref 0.1–0.9)
NEUT#: 1.4 10*3/uL — ABNORMAL LOW (ref 1.5–6.5)
NEUT%: 45.5 % (ref 39.6–76.8)
WBC: 3.1 10*3/uL — ABNORMAL LOW (ref 3.9–10.0)
lymph#: 1.6 10*3/uL (ref 0.9–3.3)

## 2008-02-18 ENCOUNTER — Ambulatory Visit: Payer: Self-pay | Admitting: Oncology

## 2008-02-20 LAB — CBC WITH DIFFERENTIAL/PLATELET
BASO%: 1.7 % (ref 0.0–2.0)
HCT: 30.9 % — ABNORMAL LOW (ref 34.8–46.6)
MCHC: 35 g/dL (ref 32.0–36.0)
MONO#: 1.2 10*3/uL — ABNORMAL HIGH (ref 0.1–0.9)
NEUT%: 66.6 % (ref 39.6–76.8)
RBC: 3.94 10*6/uL (ref 3.70–5.32)
WBC: 15.3 10*3/uL — ABNORMAL HIGH (ref 3.9–10.0)
lymph#: 3.6 10*3/uL — ABNORMAL HIGH (ref 0.9–3.3)

## 2008-02-20 LAB — PROTIME-INR: Protime: 27.6 Seconds — ABNORMAL HIGH (ref 10.6–13.4)

## 2008-02-26 LAB — CBC WITH DIFFERENTIAL/PLATELET
Basophils Absolute: 0.1 10*3/uL (ref 0.0–0.1)
EOS%: 0.7 % (ref 0.0–7.0)
HCT: 30.2 % — ABNORMAL LOW (ref 34.8–46.6)
HGB: 10.4 g/dL — ABNORMAL LOW (ref 11.6–15.9)
MCH: 27.4 pg (ref 26.0–34.0)
MCV: 79.5 fL — ABNORMAL LOW (ref 81.0–101.0)
MONO%: 1.3 % (ref 0.0–13.0)
NEUT%: 58 % (ref 39.6–76.8)

## 2008-03-12 LAB — PROTIME-INR: Protime: 19.2 Seconds — ABNORMAL HIGH (ref 10.6–13.4)

## 2008-03-12 LAB — CBC WITH DIFFERENTIAL/PLATELET
Basophils Absolute: 0.1 10*3/uL (ref 0.0–0.1)
EOS%: 0.8 % (ref 0.0–7.0)
HCT: 30.4 % — ABNORMAL LOW (ref 34.8–46.6)
HGB: 10.6 g/dL — ABNORMAL LOW (ref 11.6–15.9)
MCH: 28.2 pg (ref 26.0–34.0)
MCV: 81 fL (ref 81.0–101.0)
NEUT%: 65.9 % (ref 39.6–76.8)
lymph#: 3.5 10*3/uL — ABNORMAL HIGH (ref 0.9–3.3)

## 2008-03-12 LAB — COMPREHENSIVE METABOLIC PANEL
Albumin: 3.9 g/dL (ref 3.5–5.2)
Alkaline Phosphatase: 84 U/L (ref 39–117)
BUN: 13 mg/dL (ref 6–23)
Glucose, Bld: 98 mg/dL (ref 70–99)
Potassium: 4.3 mEq/L (ref 3.5–5.3)
Total Bilirubin: 0.5 mg/dL (ref 0.3–1.2)

## 2008-04-01 LAB — CBC WITH DIFFERENTIAL/PLATELET
Basophils Absolute: 0 10*3/uL (ref 0.0–0.1)
Eosinophils Absolute: 0.1 10*3/uL (ref 0.0–0.5)
HCT: 33 % — ABNORMAL LOW (ref 34.8–46.6)
LYMPH%: 27.6 % (ref 14.0–48.0)
MCV: 81 fL (ref 81.0–101.0)
MONO%: 11.6 % (ref 0.0–13.0)
NEUT#: 6.1 10*3/uL (ref 1.5–6.5)
NEUT%: 59.4 % (ref 39.6–76.8)
Platelets: 315 10*3/uL (ref 145–400)
RBC: 4.07 10*6/uL (ref 3.70–5.32)

## 2008-04-01 LAB — COMPREHENSIVE METABOLIC PANEL
Albumin: 4.1 g/dL (ref 3.5–5.2)
BUN: 8 mg/dL (ref 6–23)
CO2: 27 mEq/L (ref 19–32)
Calcium: 9.2 mg/dL (ref 8.4–10.5)
Chloride: 103 mEq/L (ref 96–112)
Glucose, Bld: 100 mg/dL — ABNORMAL HIGH (ref 70–99)
Potassium: 4.1 mEq/L (ref 3.5–5.3)

## 2008-04-01 LAB — PROTIME-INR: Protime: 19.2 Seconds — ABNORMAL HIGH (ref 10.6–13.4)

## 2008-04-02 LAB — CELL SEARCH FOR BREAST CANCER

## 2008-04-06 ENCOUNTER — Ambulatory Visit: Payer: Self-pay | Admitting: Oncology

## 2008-04-13 ENCOUNTER — Ambulatory Visit (HOSPITAL_COMMUNITY): Admission: RE | Admit: 2008-04-13 | Discharge: 2008-04-13 | Payer: Self-pay | Admitting: Oncology

## 2008-04-23 LAB — COMPREHENSIVE METABOLIC PANEL
AST: 12 U/L (ref 0–37)
Alkaline Phosphatase: 80 U/L (ref 39–117)
BUN: 10 mg/dL (ref 6–23)
Creatinine, Ser: 0.99 mg/dL (ref 0.40–1.20)
Potassium: 3.5 mEq/L (ref 3.5–5.3)

## 2008-04-23 LAB — CBC WITH DIFFERENTIAL/PLATELET
BASO%: 0.5 % (ref 0.0–2.0)
EOS%: 3.4 % (ref 0.0–7.0)
MCH: 26.7 pg (ref 26.0–34.0)
MCHC: 34 g/dL (ref 32.0–36.0)
MONO#: 1 10*3/uL — ABNORMAL HIGH (ref 0.1–0.9)
RBC: 3.78 10*6/uL (ref 3.70–5.32)
WBC: 10.6 10*3/uL — ABNORMAL HIGH (ref 3.9–10.0)
lymph#: 2.5 10*3/uL (ref 0.9–3.3)

## 2008-04-23 LAB — PROTIME-INR
INR: 1.4 — ABNORMAL LOW (ref 2.00–3.50)
Protime: 16.8 Seconds — ABNORMAL HIGH (ref 10.6–13.4)

## 2008-04-29 LAB — CBC WITH DIFFERENTIAL/PLATELET
BASO%: 0 % (ref 0.0–2.0)
EOS%: 1.4 % (ref 0.0–7.0)
HCT: 29.1 % — ABNORMAL LOW (ref 34.8–46.6)
MCH: 27.1 pg (ref 26.0–34.0)
MCHC: 34.4 g/dL (ref 32.0–36.0)
NEUT%: 71.6 % (ref 39.6–76.8)
RDW: 17.6 % — ABNORMAL HIGH (ref 11.3–14.5)
lymph#: 0.8 10*3/uL — ABNORMAL LOW (ref 0.9–3.3)

## 2008-04-29 LAB — COMPREHENSIVE METABOLIC PANEL
ALT: 15 U/L (ref 0–35)
AST: 14 U/L (ref 0–37)
Creatinine, Ser: 0.67 mg/dL (ref 0.40–1.20)
Total Bilirubin: 0.7 mg/dL (ref 0.3–1.2)

## 2008-04-29 LAB — PROTIME-INR

## 2008-05-14 LAB — COMPREHENSIVE METABOLIC PANEL
Albumin: 3.8 g/dL (ref 3.5–5.2)
CO2: 24 mEq/L (ref 19–32)
Calcium: 9.1 mg/dL (ref 8.4–10.5)
Chloride: 105 mEq/L (ref 96–112)
Glucose, Bld: 108 mg/dL — ABNORMAL HIGH (ref 70–99)
Sodium: 141 mEq/L (ref 135–145)
Total Bilirubin: 0.5 mg/dL (ref 0.3–1.2)
Total Protein: 7.2 g/dL (ref 6.0–8.3)

## 2008-05-14 LAB — LACTATE DEHYDROGENASE: LDH: 275 U/L — ABNORMAL HIGH (ref 94–250)

## 2008-05-14 LAB — CBC WITH DIFFERENTIAL/PLATELET
BASO%: 0.9 % (ref 0.0–2.0)
HCT: 31.3 % — ABNORMAL LOW (ref 34.8–46.6)
LYMPH%: 19 % (ref 14.0–48.0)
MCH: 25.9 pg — ABNORMAL LOW (ref 26.0–34.0)
MCHC: 33.4 g/dL (ref 32.0–36.0)
MONO#: 1.4 10*3/uL — ABNORMAL HIGH (ref 0.1–0.9)
NEUT%: 69.6 % (ref 39.6–76.8)
Platelets: 424 10*3/uL — ABNORMAL HIGH (ref 145–400)
WBC: 14.3 10*3/uL — ABNORMAL HIGH (ref 3.9–10.0)

## 2008-05-27 ENCOUNTER — Encounter: Payer: Self-pay | Admitting: Oncology

## 2008-05-27 ENCOUNTER — Ambulatory Visit: Admission: RE | Admit: 2008-05-27 | Discharge: 2008-05-27 | Payer: Self-pay | Admitting: Oncology

## 2008-05-29 ENCOUNTER — Ambulatory Visit: Payer: Self-pay | Admitting: Internal Medicine

## 2008-05-29 ENCOUNTER — Inpatient Hospital Stay (HOSPITAL_COMMUNITY): Admission: AD | Admit: 2008-05-29 | Discharge: 2008-06-02 | Payer: Self-pay | Admitting: Internal Medicine

## 2008-06-02 ENCOUNTER — Ambulatory Visit: Payer: Self-pay | Admitting: Oncology

## 2008-06-04 LAB — PROTIME-INR
INR: 1.1 — ABNORMAL LOW (ref 2.00–3.50)
Protime: 13.2 Seconds (ref 10.6–13.4)

## 2008-06-04 LAB — CBC WITH DIFFERENTIAL/PLATELET
Basophils Absolute: 0.1 10*3/uL (ref 0.0–0.1)
Eosinophils Absolute: 0.1 10*3/uL (ref 0.0–0.5)
HGB: 12.3 g/dL (ref 11.6–15.9)
MCV: 76.4 fL — ABNORMAL LOW (ref 81.0–101.0)
MONO#: 1 10*3/uL — ABNORMAL HIGH (ref 0.1–0.9)
NEUT#: 8.6 10*3/uL — ABNORMAL HIGH (ref 1.5–6.5)
RBC: 4.71 10*6/uL (ref 3.70–5.32)
RDW: 15.5 % — ABNORMAL HIGH (ref 11.3–14.5)
WBC: 11.8 10*3/uL — ABNORMAL HIGH (ref 3.9–10.0)
lymph#: 1.9 10*3/uL (ref 0.9–3.3)

## 2008-06-04 LAB — COMPREHENSIVE METABOLIC PANEL
ALT: 19 U/L (ref 0–35)
AST: 17 U/L (ref 0–37)
Albumin: 4.1 g/dL (ref 3.5–5.2)
Alkaline Phosphatase: 68 U/L (ref 39–117)
Calcium: 9.1 mg/dL (ref 8.4–10.5)
Chloride: 96 mEq/L (ref 96–112)
Potassium: 3.3 mEq/L — ABNORMAL LOW (ref 3.5–5.3)

## 2008-06-11 LAB — PROTIME-INR
INR: 4.9 — ABNORMAL HIGH (ref 2.00–3.50)
Protime: 58.8 s — ABNORMAL HIGH (ref 10.6–13.4)

## 2008-06-11 LAB — CBC WITH DIFFERENTIAL/PLATELET
BASO%: 0.9 % (ref 0.0–2.0)
Basophils Absolute: 0.1 10*3/uL (ref 0.0–0.1)
EOS%: 1.7 % (ref 0.0–7.0)
HGB: 10.5 g/dL — ABNORMAL LOW (ref 11.6–15.9)
MCH: 25.6 pg — ABNORMAL LOW (ref 26.0–34.0)
MCHC: 33.7 g/dL (ref 32.0–36.0)
RDW: 15.6 % — ABNORMAL HIGH (ref 11.3–14.5)
WBC: 8.3 10*3/uL (ref 3.9–10.0)
lymph#: 1.4 10*3/uL (ref 0.9–3.3)

## 2008-06-25 LAB — CBC WITH DIFFERENTIAL/PLATELET
BASO%: 0.8 % (ref 0.0–2.0)
EOS%: 3.7 % (ref 0.0–7.0)
MCH: 25.6 pg — ABNORMAL LOW (ref 26.0–34.0)
MCHC: 33.7 g/dL (ref 32.0–36.0)
RBC: 4.35 10*6/uL (ref 3.70–5.32)
RDW: 16.3 % — ABNORMAL HIGH (ref 11.3–14.5)
lymph#: 1.3 10*3/uL (ref 0.9–3.3)

## 2008-06-25 LAB — PROTIME-INR
INR: 1.2 — ABNORMAL LOW (ref 2.00–3.50)
Protime: 14.4 Seconds — ABNORMAL HIGH (ref 10.6–13.4)

## 2008-07-02 LAB — PROTIME-INR

## 2008-07-09 LAB — PROTIME-INR

## 2008-07-16 LAB — CBC WITH DIFFERENTIAL/PLATELET
BASO%: 0.8 % (ref 0.0–2.0)
LYMPH%: 28.9 % (ref 14.0–48.0)
MCHC: 34 g/dL (ref 32.0–36.0)
MCV: 76 fL — ABNORMAL LOW (ref 81.0–101.0)
MONO%: 7.4 % (ref 0.0–13.0)
Platelets: 245 10*3/uL (ref 145–400)
RBC: 4.08 10*6/uL (ref 3.70–5.32)
WBC: 6.9 10*3/uL (ref 3.9–10.0)

## 2008-07-16 LAB — PROTIME-INR
INR: 2.7 (ref 2.00–3.50)
Protime: 32.4 Seconds — ABNORMAL HIGH (ref 10.6–13.4)

## 2008-07-16 LAB — COMPREHENSIVE METABOLIC PANEL
ALT: <8 U/L (ref 0–35)
Albumin: 3.5 g/dL (ref 3.5–5.2)
Alkaline Phosphatase: 69 U/L (ref 39–117)
CO2: 25 mEq/L (ref 19–32)
Potassium: 3.8 mEq/L (ref 3.5–5.3)
Sodium: 138 mEq/L (ref 135–145)
Total Bilirubin: 0.4 mg/dL (ref 0.3–1.2)
Total Protein: 6.5 g/dL (ref 6.0–8.3)

## 2008-08-04 ENCOUNTER — Ambulatory Visit: Payer: Self-pay | Admitting: Oncology

## 2008-08-06 LAB — CBC WITH DIFFERENTIAL/PLATELET
Basophils Absolute: 0.1 10*3/uL (ref 0.0–0.1)
EOS%: 5.1 % (ref 0.0–7.0)
HCT: 29.1 % — ABNORMAL LOW (ref 34.8–46.6)
HGB: 9.9 g/dL — ABNORMAL LOW (ref 11.6–15.9)
LYMPH%: 28.7 % (ref 14.0–48.0)
MCH: 25.5 pg — ABNORMAL LOW (ref 26.0–34.0)
MCHC: 34 g/dL (ref 32.0–36.0)
MCV: 75.1 fL — ABNORMAL LOW (ref 81.0–101.0)
NEUT%: 57 % (ref 39.6–76.8)
Platelets: 300 10*3/uL (ref 145–400)
lymph#: 2.1 10*3/uL (ref 0.9–3.3)

## 2008-08-06 LAB — COMPREHENSIVE METABOLIC PANEL
AST: 10 U/L (ref 0–37)
Albumin: 3.6 g/dL (ref 3.5–5.2)
Alkaline Phosphatase: 68 U/L (ref 39–117)
BUN: 11 mg/dL (ref 6–23)
Creatinine, Ser: 0.9 mg/dL (ref 0.40–1.20)
Glucose, Bld: 97 mg/dL (ref 70–99)
Potassium: 3.7 mEq/L (ref 3.5–5.3)
Total Bilirubin: 0.4 mg/dL (ref 0.3–1.2)

## 2008-08-10 LAB — CELL SEARCH FOR BREAST CANCER

## 2008-09-03 LAB — CBC WITH DIFFERENTIAL/PLATELET
BASO%: 0.3 % (ref 0.0–2.0)
EOS%: 1.8 % (ref 0.0–7.0)
HCT: 32.6 % — ABNORMAL LOW (ref 34.8–46.6)
LYMPH%: 13.3 % — ABNORMAL LOW (ref 14.0–48.0)
MCH: 25.6 pg — ABNORMAL LOW (ref 26.0–34.0)
MCHC: 34 g/dL (ref 32.0–36.0)
MONO#: 0.4 10*3/uL (ref 0.1–0.9)
MONO%: 3.6 % (ref 0.0–13.0)
NEUT%: 81 % — ABNORMAL HIGH (ref 39.6–76.8)
Platelets: 222 10*3/uL (ref 145–400)
RBC: 4.32 10*6/uL (ref 3.70–5.32)
WBC: 11.2 10*3/uL — ABNORMAL HIGH (ref 3.9–10.0)

## 2008-09-03 LAB — COMPREHENSIVE METABOLIC PANEL
ALT: 8 U/L (ref 0–35)
CO2: 24 mEq/L (ref 19–32)
Sodium: 139 mEq/L (ref 135–145)
Total Bilirubin: 0.5 mg/dL (ref 0.3–1.2)
Total Protein: 7.7 g/dL (ref 6.0–8.3)

## 2008-09-03 LAB — CANCER ANTIGEN 27.29: CA 27.29: 22 U/mL (ref 0–39)

## 2008-09-03 LAB — LACTATE DEHYDROGENASE: LDH: 193 U/L (ref 94–250)

## 2008-09-03 LAB — PROTIME-INR: Protime: 31.2 Seconds — ABNORMAL HIGH (ref 10.6–13.4)

## 2008-09-22 ENCOUNTER — Ambulatory Visit: Payer: Self-pay | Admitting: Oncology

## 2008-09-24 LAB — COMPREHENSIVE METABOLIC PANEL
ALT: 8 U/L (ref 0–35)
AST: 10 U/L (ref 0–37)
Alkaline Phosphatase: 86 U/L (ref 39–117)
CO2: 24 mEq/L (ref 19–32)
Creatinine, Ser: 0.69 mg/dL (ref 0.40–1.20)
Sodium: 142 mEq/L (ref 135–145)
Total Bilirubin: 0.7 mg/dL (ref 0.3–1.2)
Total Protein: 7.8 g/dL (ref 6.0–8.3)

## 2008-09-24 LAB — CBC WITH DIFFERENTIAL/PLATELET
BASO%: 0.2 % (ref 0.0–2.0)
EOS%: 1.8 % (ref 0.0–7.0)
Eosinophils Absolute: 0.2 10*3/uL (ref 0.0–0.5)
LYMPH%: 20.7 % (ref 14.0–48.0)
MCH: 25.4 pg — ABNORMAL LOW (ref 26.0–34.0)
MCHC: 33.4 g/dL (ref 32.0–36.0)
MCV: 76.1 fL — ABNORMAL LOW (ref 81.0–101.0)
MONO%: 3.8 % (ref 0.0–13.0)
NEUT#: 6.6 10*3/uL — ABNORMAL HIGH (ref 1.5–6.5)
Platelets: 262 10*3/uL (ref 145–400)
RBC: 4.66 10*6/uL (ref 3.70–5.32)
RDW: 17.8 % — ABNORMAL HIGH (ref 11.3–14.5)

## 2008-09-24 LAB — CANCER ANTIGEN 27.29: CA 27.29: 16 U/mL (ref 0–39)

## 2008-09-24 LAB — PROTIME-INR
INR: 2.2 (ref 2.00–3.50)
Protime: 26.4 Seconds — ABNORMAL HIGH (ref 10.6–13.4)

## 2008-10-05 ENCOUNTER — Encounter: Payer: Self-pay | Admitting: Oncology

## 2008-10-05 ENCOUNTER — Ambulatory Visit: Payer: Self-pay | Admitting: Internal Medicine

## 2008-10-05 ENCOUNTER — Ambulatory Visit (HOSPITAL_COMMUNITY): Admission: RE | Admit: 2008-10-05 | Discharge: 2008-10-05 | Payer: Self-pay | Admitting: Oncology

## 2008-10-15 ENCOUNTER — Ambulatory Visit: Payer: Self-pay | Admitting: Oncology

## 2008-10-15 LAB — COMPREHENSIVE METABOLIC PANEL
CO2: 26 mEq/L (ref 19–32)
Creatinine, Ser: 0.85 mg/dL (ref 0.40–1.20)
Glucose, Bld: 99 mg/dL (ref 70–99)
Sodium: 142 mEq/L (ref 135–145)
Total Bilirubin: 0.4 mg/dL (ref 0.3–1.2)
Total Protein: 7.4 g/dL (ref 6.0–8.3)

## 2008-10-15 LAB — CBC WITH DIFFERENTIAL/PLATELET
BASO%: 0.4 % (ref 0.0–2.0)
EOS%: 2.8 % (ref 0.0–7.0)
HCT: 31.5 % — ABNORMAL LOW (ref 34.8–46.6)
LYMPH%: 37.2 % (ref 14.0–49.7)
MCH: 24.7 pg — ABNORMAL LOW (ref 25.1–34.0)
MCHC: 34.3 g/dL (ref 31.5–36.0)
MCV: 72.1 fL — ABNORMAL LOW (ref 79.5–101.0)
MONO%: 8.9 % (ref 0.0–14.0)
NEUT%: 50.7 % (ref 38.4–76.8)
Platelets: 240 10*3/uL (ref 145–400)

## 2008-10-15 LAB — CANCER ANTIGEN 27.29: CA 27.29: 20 U/mL (ref 0–39)

## 2008-11-05 LAB — CBC WITH DIFFERENTIAL/PLATELET
BASO%: 0.4 % (ref 0.0–2.0)
HCT: 32 % — ABNORMAL LOW (ref 34.8–46.6)
LYMPH%: 29.1 % (ref 14.0–49.7)
MCH: 24.8 pg — ABNORMAL LOW (ref 25.1–34.0)
MCHC: 34.4 g/dL (ref 31.5–36.0)
MCV: 72.2 fL — ABNORMAL LOW (ref 79.5–101.0)
MONO#: 0.7 10*3/uL (ref 0.1–0.9)
MONO%: 9 % (ref 0.0–14.0)
NEUT%: 58 % (ref 38.4–76.8)
Platelets: 242 10*3/uL (ref 145–400)
WBC: 8 10*3/uL (ref 3.9–10.3)

## 2008-11-05 LAB — COMPREHENSIVE METABOLIC PANEL
ALT: <8 U/L (ref 0–35)
CO2: 26 mEq/L (ref 19–32)
Calcium: 8.5 mg/dL (ref 8.4–10.5)
Chloride: 102 mEq/L (ref 96–112)
Creatinine, Ser: 0.83 mg/dL (ref 0.40–1.20)
Total Protein: 7.2 g/dL (ref 6.0–8.3)

## 2008-11-05 LAB — PROTIME-INR

## 2008-11-05 LAB — LACTATE DEHYDROGENASE: LDH: 153 U/L (ref 94–250)

## 2008-11-23 ENCOUNTER — Ambulatory Visit: Payer: Self-pay | Admitting: Oncology

## 2008-11-26 LAB — COMPREHENSIVE METABOLIC PANEL
ALT: <8 U/L (ref 0–35)
AST: 8 U/L (ref 0–37)
Albumin: 3.8 g/dL (ref 3.5–5.2)
BUN: 25 mg/dL — ABNORMAL HIGH (ref 6–23)
CO2: 25 mEq/L (ref 19–32)
Calcium: 8.4 mg/dL (ref 8.4–10.5)
Chloride: 103 mEq/L (ref 96–112)
Potassium: 3.9 mEq/L (ref 3.5–5.3)

## 2008-11-26 LAB — CBC WITH DIFFERENTIAL/PLATELET
Basophils Absolute: 0 10*3/uL (ref 0.0–0.1)
Eosinophils Absolute: 0.3 10*3/uL (ref 0.0–0.5)
HCT: 31.6 % — ABNORMAL LOW (ref 34.8–46.6)
HGB: 10.9 g/dL — ABNORMAL LOW (ref 11.6–15.9)
MCV: 72 fL — ABNORMAL LOW (ref 79.5–101.0)
MONO%: 7.8 % (ref 0.0–14.0)
NEUT#: 4.3 10*3/uL (ref 1.5–6.5)
NEUT%: 53.7 % (ref 38.4–76.8)
Platelets: 244 10*3/uL (ref 145–400)
RDW: 17.3 % — ABNORMAL HIGH (ref 11.2–14.5)

## 2008-11-26 LAB — PROTIME-INR: INR: 1.6 — ABNORMAL LOW (ref 2.00–3.50)

## 2008-11-26 LAB — LACTATE DEHYDROGENASE: LDH: 156 U/L (ref 94–250)

## 2008-11-26 LAB — CANCER ANTIGEN 27.29: CA 27.29: 20 U/mL (ref 0–39)

## 2008-12-03 LAB — BASIC METABOLIC PANEL
CO2: 30 mEq/L (ref 19–32)
Calcium: 9 mg/dL (ref 8.4–10.5)
Creatinine, Ser: 0.71 mg/dL (ref 0.40–1.20)
Sodium: 139 mEq/L (ref 135–145)

## 2008-12-17 LAB — CBC WITH DIFFERENTIAL/PLATELET
Basophils Absolute: 0 10*3/uL (ref 0.0–0.1)
Eosinophils Absolute: 0.2 10*3/uL (ref 0.0–0.5)
HCT: 34.3 % — ABNORMAL LOW (ref 34.8–46.6)
HGB: 12 g/dL (ref 11.6–15.9)
MCH: 25.4 pg (ref 25.1–34.0)
MONO#: 0.5 10*3/uL (ref 0.1–0.9)
NEUT#: 5.6 10*3/uL (ref 1.5–6.5)
NEUT%: 62.2 % (ref 38.4–76.8)
RDW: 17.7 % — ABNORMAL HIGH (ref 11.2–14.5)
WBC: 9 10*3/uL (ref 3.9–10.3)
lymph#: 2.7 10*3/uL (ref 0.9–3.3)

## 2008-12-17 LAB — COMPREHENSIVE METABOLIC PANEL
ALT: 11 U/L (ref 0–35)
AST: 13 U/L (ref 0–37)
Alkaline Phosphatase: 92 U/L (ref 39–117)
Potassium: 3.7 mEq/L (ref 3.5–5.3)
Sodium: 139 mEq/L (ref 135–145)
Total Bilirubin: 0.7 mg/dL (ref 0.3–1.2)
Total Protein: 8.2 g/dL (ref 6.0–8.3)

## 2008-12-17 LAB — PROTIME-INR: INR: 1.3 — ABNORMAL LOW (ref 2.00–3.50)

## 2009-01-04 ENCOUNTER — Encounter: Payer: Self-pay | Admitting: Oncology

## 2009-01-04 ENCOUNTER — Ambulatory Visit: Admission: RE | Admit: 2009-01-04 | Discharge: 2009-01-04 | Payer: Self-pay | Admitting: Oncology

## 2009-01-07 ENCOUNTER — Ambulatory Visit: Payer: Self-pay | Admitting: Oncology

## 2009-01-07 LAB — COMPREHENSIVE METABOLIC PANEL
ALT: 8 U/L (ref 0–35)
AST: 10 U/L (ref 0–37)
Alkaline Phosphatase: 94 U/L (ref 39–117)
Creatinine, Ser: 1.08 mg/dL (ref 0.40–1.20)
Sodium: 139 mEq/L (ref 135–145)
Total Bilirubin: 0.5 mg/dL (ref 0.3–1.2)
Total Protein: 7.5 g/dL (ref 6.0–8.3)

## 2009-01-07 LAB — CBC WITH DIFFERENTIAL/PLATELET
BASO%: 0.3 % (ref 0.0–2.0)
Basophils Absolute: 0 10*3/uL (ref 0.0–0.1)
EOS%: 3.1 % (ref 0.0–7.0)
HCT: 32.3 % — ABNORMAL LOW (ref 34.8–46.6)
HGB: 11.2 g/dL — ABNORMAL LOW (ref 11.6–15.9)
MCH: 25.8 pg (ref 25.1–34.0)
MCHC: 34.7 g/dL (ref 31.5–36.0)
MCV: 74.4 fL — ABNORMAL LOW (ref 79.5–101.0)
MONO%: 5.7 % (ref 0.0–14.0)
NEUT%: 63.7 % (ref 38.4–76.8)
RDW: 17.3 % — ABNORMAL HIGH (ref 11.2–14.5)
lymph#: 2.8 10*3/uL (ref 0.9–3.3)

## 2009-01-28 LAB — CBC WITH DIFFERENTIAL/PLATELET
BASO%: 0.3 % (ref 0.0–2.0)
MCHC: 34.3 g/dL (ref 31.5–36.0)
MONO#: 0.8 10*3/uL (ref 0.1–0.9)
RBC: 3.95 10*6/uL (ref 3.70–5.45)
RDW: 16.5 % — ABNORMAL HIGH (ref 11.2–14.5)
WBC: 9.1 10*3/uL (ref 3.9–10.3)
lymph#: 3.2 10*3/uL (ref 0.9–3.3)

## 2009-01-28 LAB — PROTIME-INR: Protime: 13.2 Seconds (ref 10.6–13.4)

## 2009-01-28 LAB — COMPREHENSIVE METABOLIC PANEL
ALT: 10 U/L (ref 0–35)
Alkaline Phosphatase: 79 U/L (ref 39–117)
Sodium: 140 mEq/L (ref 135–145)
Total Bilirubin: 0.3 mg/dL (ref 0.3–1.2)
Total Protein: 7.3 g/dL (ref 6.0–8.3)

## 2009-02-04 LAB — PROTIME-INR: Protime: 25.2 Seconds — ABNORMAL HIGH (ref 10.6–13.4)

## 2009-02-16 ENCOUNTER — Ambulatory Visit: Payer: Self-pay | Admitting: Oncology

## 2009-02-18 LAB — PROTIME-INR

## 2009-02-18 LAB — CBC WITH DIFFERENTIAL/PLATELET
Basophils Absolute: 0 10*3/uL (ref 0.0–0.1)
Eosinophils Absolute: 0.3 10*3/uL (ref 0.0–0.5)
HCT: 32 % — ABNORMAL LOW (ref 34.8–46.6)
HGB: 11.2 g/dL — ABNORMAL LOW (ref 11.6–15.9)
LYMPH%: 41.5 % (ref 14.0–49.7)
MCV: 74.8 fL — ABNORMAL LOW (ref 79.5–101.0)
MONO#: 0.6 10*3/uL (ref 0.1–0.9)
MONO%: 7.5 % (ref 0.0–14.0)
NEUT#: 3.6 10*3/uL (ref 1.5–6.5)
Platelets: 230 10*3/uL (ref 145–400)
WBC: 7.7 10*3/uL (ref 3.9–10.3)
nRBC: 0 % (ref 0–0)

## 2009-02-21 LAB — COMPREHENSIVE METABOLIC PANEL
ALT: 11 U/L (ref 0–35)
AST: 14 U/L (ref 0–37)
Albumin: 3.7 g/dL (ref 3.5–5.2)
Alkaline Phosphatase: 112 U/L (ref 39–117)
Glucose, Bld: 92 mg/dL (ref 70–99)
Potassium: 4.4 mEq/L (ref 3.5–5.3)
Sodium: 140 mEq/L (ref 135–145)
Total Bilirubin: 0.4 mg/dL (ref 0.3–1.2)
Total Protein: 7.7 g/dL (ref 6.0–8.3)

## 2009-02-22 LAB — PROTIME-INR: INR: 3.1 (ref 2.00–3.50)

## 2009-03-02 LAB — PROTIME-INR: INR: 2.2 (ref 2.00–3.50)

## 2009-03-11 LAB — CBC WITH DIFFERENTIAL/PLATELET
Basophils Absolute: 0 10*3/uL (ref 0.0–0.1)
Eosinophils Absolute: 0.4 10*3/uL (ref 0.0–0.5)
HCT: 32.3 % — ABNORMAL LOW (ref 34.8–46.6)
HGB: 11.4 g/dL — ABNORMAL LOW (ref 11.6–15.9)
MCV: 74.4 fL — ABNORMAL LOW (ref 79.5–101.0)
MONO%: 8.8 % (ref 0.0–14.0)
NEUT#: 4 10*3/uL (ref 1.5–6.5)
NEUT%: 48.4 % (ref 38.4–76.8)
RDW: 15.9 % — ABNORMAL HIGH (ref 11.2–14.5)
lymph#: 3.1 10*3/uL (ref 0.9–3.3)

## 2009-03-11 LAB — COMPREHENSIVE METABOLIC PANEL
Albumin: 3.8 g/dL (ref 3.5–5.2)
CO2: 24 mEq/L (ref 19–32)
Glucose, Bld: 95 mg/dL (ref 70–99)
Potassium: 3.8 mEq/L (ref 3.5–5.3)
Sodium: 139 mEq/L (ref 135–145)
Total Protein: 7.6 g/dL (ref 6.0–8.3)

## 2009-03-11 LAB — LACTATE DEHYDROGENASE: LDH: 205 U/L (ref 94–250)

## 2009-03-11 LAB — CANCER ANTIGEN 27.29: CA 27.29: 31 U/mL (ref 0–39)

## 2009-03-30 ENCOUNTER — Ambulatory Visit: Payer: Self-pay | Admitting: Oncology

## 2009-04-01 LAB — COMPREHENSIVE METABOLIC PANEL
ALT: 9 U/L (ref 0–35)
Albumin: 3.6 g/dL (ref 3.5–5.2)
CO2: 25 mEq/L (ref 19–32)
Calcium: 8.7 mg/dL (ref 8.4–10.5)
Chloride: 104 mEq/L (ref 96–112)
Potassium: 4.1 mEq/L (ref 3.5–5.3)
Sodium: 139 mEq/L (ref 135–145)
Total Bilirubin: 0.4 mg/dL (ref 0.3–1.2)
Total Protein: 7 g/dL (ref 6.0–8.3)

## 2009-04-01 LAB — PROTIME-INR
INR: 1.2 — ABNORMAL LOW (ref 2.00–3.50)
Protime: 14.4 Seconds — ABNORMAL HIGH (ref 10.6–13.4)

## 2009-04-01 LAB — CBC WITH DIFFERENTIAL/PLATELET
BASO%: 0.5 % (ref 0.0–2.0)
MCHC: 34.8 g/dL (ref 31.5–36.0)
MONO#: 0.7 10*3/uL (ref 0.1–0.9)
RBC: 4.07 10*6/uL (ref 3.70–5.45)
WBC: 8.4 10*3/uL (ref 3.9–10.3)
lymph#: 3.4 10*3/uL — ABNORMAL HIGH (ref 0.9–3.3)

## 2009-04-01 LAB — LACTATE DEHYDROGENASE: LDH: 201 U/L (ref 94–250)

## 2009-04-01 LAB — CANCER ANTIGEN 27.29: CA 27.29: 17 U/mL (ref 0–39)

## 2009-04-13 ENCOUNTER — Ambulatory Visit: Admission: RE | Admit: 2009-04-13 | Discharge: 2009-04-13 | Payer: Self-pay | Admitting: Oncology

## 2009-04-13 ENCOUNTER — Ambulatory Visit: Payer: Self-pay | Admitting: Internal Medicine

## 2009-04-13 ENCOUNTER — Encounter: Payer: Self-pay | Admitting: Oncology

## 2009-04-20 ENCOUNTER — Ambulatory Visit (HOSPITAL_COMMUNITY): Admission: RE | Admit: 2009-04-20 | Discharge: 2009-04-20 | Payer: Self-pay | Admitting: Oncology

## 2009-04-22 LAB — CBC WITH DIFFERENTIAL/PLATELET
Basophils Absolute: 0 10*3/uL (ref 0.0–0.1)
Eosinophils Absolute: 0.3 10*3/uL (ref 0.0–0.5)
HGB: 11 g/dL — ABNORMAL LOW (ref 11.6–15.9)
LYMPH%: 39.4 % (ref 14.0–49.7)
MCV: 75.4 fL — ABNORMAL LOW (ref 79.5–101.0)
MONO%: 8.4 % (ref 0.0–14.0)
NEUT#: 3.8 10*3/uL (ref 1.5–6.5)
NEUT%: 48.2 % (ref 38.4–76.8)
Platelets: 230 10*3/uL (ref 145–400)

## 2009-04-22 LAB — COMPREHENSIVE METABOLIC PANEL
AST: 19 U/L (ref 0–37)
Alkaline Phosphatase: 98 U/L (ref 39–117)
Glucose, Bld: 88 mg/dL (ref 70–99)
Potassium: 4 mEq/L (ref 3.5–5.3)
Sodium: 138 mEq/L (ref 135–145)
Total Bilirubin: 0.4 mg/dL (ref 0.3–1.2)
Total Protein: 7.5 g/dL (ref 6.0–8.3)

## 2009-05-11 ENCOUNTER — Ambulatory Visit: Payer: Self-pay | Admitting: Oncology

## 2009-05-13 LAB — CBC WITH DIFFERENTIAL/PLATELET
BASO%: 0.2 % (ref 0.0–2.0)
Eosinophils Absolute: 0.2 10*3/uL (ref 0.0–0.5)
HCT: 31.5 % — ABNORMAL LOW (ref 34.8–46.6)
MCHC: 34.6 g/dL (ref 31.5–36.0)
MONO#: 0.9 10*3/uL (ref 0.1–0.9)
NEUT#: 6.1 10*3/uL (ref 1.5–6.5)
NEUT%: 59.5 % (ref 38.4–76.8)
RBC: 4.22 10*6/uL (ref 3.70–5.45)
WBC: 10.3 10*3/uL (ref 3.9–10.3)
lymph#: 3 10*3/uL (ref 0.9–3.3)

## 2009-05-13 LAB — PROTIME-INR: Protime: 42 Seconds — ABNORMAL HIGH (ref 10.6–13.4)

## 2009-06-03 LAB — CBC WITH DIFFERENTIAL/PLATELET
Basophils Absolute: 0 10*3/uL (ref 0.0–0.1)
Eosinophils Absolute: 0.3 10*3/uL (ref 0.0–0.5)
HGB: 11.7 g/dL (ref 11.6–15.9)
MONO#: 0.7 10*3/uL (ref 0.1–0.9)
MONO%: 7 % (ref 0.0–14.0)
NEUT#: 7 10*3/uL — ABNORMAL HIGH (ref 1.5–6.5)
RBC: 4.31 10*6/uL (ref 3.70–5.45)
RDW: 16.2 % — ABNORMAL HIGH (ref 11.2–14.5)
WBC: 9.9 10*3/uL (ref 3.9–10.3)
lymph#: 1.9 10*3/uL (ref 0.9–3.3)

## 2009-06-03 LAB — COMPREHENSIVE METABOLIC PANEL
Albumin: 4.1 g/dL (ref 3.5–5.2)
Alkaline Phosphatase: 99 U/L (ref 39–117)
BUN: 14 mg/dL (ref 6–23)
CO2: 23 mEq/L (ref 19–32)
Glucose, Bld: 98 mg/dL (ref 70–99)
Potassium: 3.9 mEq/L (ref 3.5–5.3)
Sodium: 140 mEq/L (ref 135–145)
Total Protein: 7.9 g/dL (ref 6.0–8.3)

## 2009-06-03 LAB — PROTIME-INR
INR: 3.6 — ABNORMAL HIGH (ref 2.00–3.50)
Protime: 43.2 Seconds — ABNORMAL HIGH (ref 10.6–13.4)

## 2009-06-22 ENCOUNTER — Ambulatory Visit: Payer: Self-pay | Admitting: Oncology

## 2009-06-24 LAB — CBC WITH DIFFERENTIAL/PLATELET
Eosinophils Absolute: 0.2 10*3/uL (ref 0.0–0.5)
HGB: 11.8 g/dL (ref 11.6–15.9)
MCV: 75.3 fL — ABNORMAL LOW (ref 79.5–101.0)
MONO%: 7.2 % (ref 0.0–14.0)
NEUT#: 3.4 10*3/uL (ref 1.5–6.5)
RBC: 4.49 10*6/uL (ref 3.70–5.45)
RDW: 15.6 % — ABNORMAL HIGH (ref 11.2–14.5)
WBC: 6.2 10*3/uL (ref 3.9–10.3)
lymph#: 2.2 10*3/uL (ref 0.9–3.3)
nRBC: 0 % (ref 0–0)

## 2009-07-15 LAB — COMPREHENSIVE METABOLIC PANEL
AST: 12 U/L (ref 0–37)
Albumin: 3.9 g/dL (ref 3.5–5.2)
Alkaline Phosphatase: 93 U/L (ref 39–117)
BUN: 12 mg/dL (ref 6–23)
Glucose, Bld: 93 mg/dL (ref 70–99)
Potassium: 4.2 mEq/L (ref 3.5–5.3)
Sodium: 139 mEq/L (ref 135–145)
Total Bilirubin: 0.5 mg/dL (ref 0.3–1.2)
Total Protein: 7.6 g/dL (ref 6.0–8.3)

## 2009-07-15 LAB — CBC WITH DIFFERENTIAL/PLATELET
BASO%: 0.2 % (ref 0.0–2.0)
EOS%: 2.9 % (ref 0.0–7.0)
MCH: 25.8 pg (ref 25.1–34.0)
MCHC: 34.2 g/dL (ref 31.5–36.0)
MCV: 75.6 fL — ABNORMAL LOW (ref 79.5–101.0)
MONO%: 9.9 % (ref 0.0–14.0)
RDW: 16 % — ABNORMAL HIGH (ref 11.2–14.5)
lymph#: 2.9 10*3/uL (ref 0.9–3.3)

## 2009-07-15 LAB — PROTIME-INR
INR: 2.3 (ref 2.00–3.50)
Protime: 27.6 Seconds — ABNORMAL HIGH (ref 10.6–13.4)

## 2009-08-03 ENCOUNTER — Ambulatory Visit: Payer: Self-pay | Admitting: Oncology

## 2009-08-05 LAB — CBC WITH DIFFERENTIAL/PLATELET
BASO%: 0.4 % (ref 0.0–2.0)
Eosinophils Absolute: 0.1 10*3/uL (ref 0.0–0.5)
LYMPH%: 30.8 % (ref 14.0–49.7)
MCHC: 34.5 g/dL (ref 31.5–36.0)
MCV: 75.3 fL — ABNORMAL LOW (ref 79.5–101.0)
MONO%: 5 % (ref 0.0–14.0)
Platelets: 227 10*3/uL (ref 145–400)
RBC: 4.54 10*6/uL (ref 3.70–5.45)

## 2009-08-05 LAB — PROTIME-INR: Protime: 40.8 Seconds — ABNORMAL HIGH (ref 10.6–13.4)

## 2009-08-15 ENCOUNTER — Ambulatory Visit (HOSPITAL_COMMUNITY): Admission: RE | Admit: 2009-08-15 | Discharge: 2009-08-15 | Payer: Self-pay | Admitting: Oncology

## 2009-08-15 ENCOUNTER — Ambulatory Visit: Payer: Self-pay | Admitting: Cardiology

## 2009-08-16 ENCOUNTER — Encounter: Payer: Self-pay | Admitting: Oncology

## 2009-08-16 ENCOUNTER — Ambulatory Visit: Admission: RE | Admit: 2009-08-16 | Discharge: 2009-08-16 | Payer: Self-pay | Admitting: Oncology

## 2009-08-25 LAB — CBC WITH DIFFERENTIAL/PLATELET
Basophils Absolute: 0.1 10*3/uL (ref 0.0–0.1)
Eosinophils Absolute: 0.3 10*3/uL (ref 0.0–0.5)
HCT: 32 % — ABNORMAL LOW (ref 34.8–46.6)
LYMPH%: 32.3 % (ref 14.0–49.7)
MCV: 77.3 fL — ABNORMAL LOW (ref 79.5–101.0)
MONO%: 6.6 % (ref 0.0–14.0)
NEUT#: 5 10*3/uL (ref 1.5–6.5)
NEUT%: 57.3 % (ref 38.4–76.8)
Platelets: 219 10*3/uL (ref 145–400)
RBC: 4.14 10*6/uL (ref 3.70–5.45)

## 2009-08-25 LAB — COMPREHENSIVE METABOLIC PANEL
Albumin: 3.2 g/dL — ABNORMAL LOW (ref 3.5–5.2)
BUN: 7 mg/dL (ref 6–23)
CO2: 26 mEq/L (ref 19–32)
Calcium: 8.2 mg/dL — ABNORMAL LOW (ref 8.4–10.5)
Chloride: 105 mEq/L (ref 96–112)
Creatinine, Ser: 0.86 mg/dL (ref 0.40–1.20)
Glucose, Bld: 114 mg/dL — ABNORMAL HIGH (ref 70–99)

## 2009-08-25 LAB — TECHNOLOGIST REVIEW

## 2009-08-25 LAB — CANCER ANTIGEN 27.29: CA 27.29: 18 U/mL (ref 0–39)

## 2009-09-14 ENCOUNTER — Ambulatory Visit: Payer: Self-pay | Admitting: Oncology

## 2009-09-16 LAB — PROTIME-INR
INR: 1.9 — ABNORMAL LOW (ref 2.00–3.50)
Protime: 22.8 Seconds — ABNORMAL HIGH (ref 10.6–13.4)

## 2009-09-16 LAB — COMPREHENSIVE METABOLIC PANEL
Albumin: 3.7 g/dL (ref 3.5–5.2)
Alkaline Phosphatase: 87 U/L (ref 39–117)
BUN: 10 mg/dL (ref 6–23)
CO2: 22 mEq/L (ref 19–32)
Calcium: 9.2 mg/dL (ref 8.4–10.5)
Chloride: 103 mEq/L (ref 96–112)
Glucose, Bld: 142 mg/dL — ABNORMAL HIGH (ref 70–99)
Potassium: 3.5 mEq/L (ref 3.5–5.3)
Sodium: 138 mEq/L (ref 135–145)
Total Protein: 7.8 g/dL (ref 6.0–8.3)

## 2009-09-16 LAB — CBC WITH DIFFERENTIAL/PLATELET
BASO%: 0.9 % (ref 0.0–2.0)
LYMPH%: 19.2 % (ref 14.0–49.7)
MCHC: 34.3 g/dL (ref 31.5–36.0)
MONO#: 0.1 10*3/uL (ref 0.1–0.9)
MONO%: 1.7 % (ref 0.0–14.0)
Platelets: 229 10*3/uL (ref 145–400)
RBC: 4.53 10*6/uL (ref 3.70–5.45)
RDW: 17.1 % — ABNORMAL HIGH (ref 11.2–14.5)
WBC: 8 10*3/uL (ref 3.9–10.3)

## 2009-09-28 ENCOUNTER — Ambulatory Visit (HOSPITAL_COMMUNITY): Admission: RE | Admit: 2009-09-28 | Discharge: 2009-09-28 | Payer: Self-pay | Admitting: Oncology

## 2009-10-07 LAB — CBC WITH DIFFERENTIAL/PLATELET
BASO%: 0.4 % (ref 0.0–2.0)
LYMPH%: 29.7 % (ref 14.0–49.7)
MCHC: 34.3 g/dL (ref 31.5–36.0)
MONO#: 0.5 10*3/uL (ref 0.1–0.9)
NEUT#: 4.9 10*3/uL (ref 1.5–6.5)
RBC: 4.37 10*6/uL (ref 3.70–5.45)
RDW: 16.4 % — ABNORMAL HIGH (ref 11.2–14.5)
WBC: 8 10*3/uL (ref 3.9–10.3)
lymph#: 2.4 10*3/uL (ref 0.9–3.3)
nRBC: 0 % (ref 0–0)

## 2009-10-07 LAB — COMPREHENSIVE METABOLIC PANEL
ALT: 10 U/L (ref 0–35)
Alkaline Phosphatase: 83 U/L (ref 39–117)
CO2: 21 mEq/L (ref 19–32)
Creatinine, Ser: 0.77 mg/dL (ref 0.40–1.20)
Total Bilirubin: 0.6 mg/dL (ref 0.3–1.2)

## 2009-10-07 LAB — PROTIME-INR
INR: 1.6 — ABNORMAL LOW (ref 2.00–3.50)
Protime: 19.2 Seconds — ABNORMAL HIGH (ref 10.6–13.4)

## 2009-10-07 LAB — LACTATE DEHYDROGENASE: LDH: 176 U/L (ref 94–250)

## 2009-10-25 ENCOUNTER — Ambulatory Visit: Payer: Self-pay | Admitting: Oncology

## 2009-10-28 LAB — COMPREHENSIVE METABOLIC PANEL
ALT: 8 U/L (ref 0–35)
Alkaline Phosphatase: 88 U/L (ref 39–117)
CO2: 24 mEq/L (ref 19–32)
Sodium: 139 mEq/L (ref 135–145)
Total Bilirubin: 0.6 mg/dL (ref 0.3–1.2)
Total Protein: 7.6 g/dL (ref 6.0–8.3)

## 2009-10-28 LAB — CBC WITH DIFFERENTIAL/PLATELET
BASO%: 0.2 % (ref 0.0–2.0)
LYMPH%: 29 % (ref 14.0–49.7)
MCHC: 34.5 g/dL (ref 31.5–36.0)
MCV: 76 fL — ABNORMAL LOW (ref 79.5–101.0)
MONO%: 6 % (ref 0.0–14.0)
Platelets: 250 10*3/uL (ref 145–400)
RBC: 4.42 10*6/uL (ref 3.70–5.45)

## 2009-10-28 LAB — PROTIME-INR: Protime: 28.8 Seconds — ABNORMAL HIGH (ref 10.6–13.4)

## 2009-11-08 ENCOUNTER — Encounter: Payer: Self-pay | Admitting: Oncology

## 2009-11-08 ENCOUNTER — Ambulatory Visit: Payer: Self-pay | Admitting: Cardiology

## 2009-11-08 ENCOUNTER — Ambulatory Visit: Admission: RE | Admit: 2009-11-08 | Discharge: 2009-11-08 | Payer: Self-pay | Admitting: Oncology

## 2009-11-18 LAB — CBC WITH DIFFERENTIAL/PLATELET
BASO%: 0.3 % (ref 0.0–2.0)
LYMPH%: 31.5 % (ref 14.0–49.7)
MCHC: 34.8 g/dL (ref 31.5–36.0)
MCV: 76.1 fL — ABNORMAL LOW (ref 79.5–101.0)
MONO%: 6.8 % (ref 0.0–14.0)
Platelets: 205 10*3/uL (ref 145–400)
RBC: 4.31 10*6/uL (ref 3.70–5.45)
RDW: 16.3 % — ABNORMAL HIGH (ref 11.2–14.5)
WBC: 10.8 10*3/uL — ABNORMAL HIGH (ref 3.9–10.3)

## 2009-11-18 LAB — COMPREHENSIVE METABOLIC PANEL
Albumin: 3.8 g/dL (ref 3.5–5.2)
Alkaline Phosphatase: 94 U/L (ref 39–117)
BUN: 14 mg/dL (ref 6–23)
Glucose, Bld: 101 mg/dL — ABNORMAL HIGH (ref 70–99)
Total Bilirubin: 0.6 mg/dL (ref 0.3–1.2)

## 2009-11-18 LAB — PROTIME-INR: INR: 2 (ref 2.00–3.50)

## 2009-12-07 ENCOUNTER — Ambulatory Visit: Payer: Self-pay | Admitting: Oncology

## 2009-12-09 LAB — PROTIME-INR: Protime: 26.4 Seconds — ABNORMAL HIGH (ref 10.6–13.4)

## 2009-12-09 LAB — CBC WITH DIFFERENTIAL/PLATELET
BASO%: 0.1 % (ref 0.0–2.0)
Eosinophils Absolute: 0.2 10*3/uL (ref 0.0–0.5)
HCT: 34.8 % (ref 34.8–46.6)
MCHC: 34.7 g/dL (ref 31.5–36.0)
MONO#: 0.5 10*3/uL (ref 0.1–0.9)
NEUT#: 7.1 10*3/uL — ABNORMAL HIGH (ref 1.5–6.5)
NEUT%: 70.6 % (ref 38.4–76.8)
WBC: 10 10*3/uL (ref 3.9–10.3)
lymph#: 2.3 10*3/uL (ref 0.9–3.3)

## 2009-12-30 LAB — CBC WITH DIFFERENTIAL/PLATELET
BASO%: 0.3 % (ref 0.0–2.0)
HCT: 33.3 % — ABNORMAL LOW (ref 34.8–46.6)
HGB: 11.7 g/dL (ref 11.6–15.9)
MCHC: 35.1 g/dL (ref 31.5–36.0)
MONO#: 0.7 10*3/uL (ref 0.1–0.9)
NEUT#: 6.5 10*3/uL (ref 1.5–6.5)
NEUT%: 62.4 % (ref 38.4–76.8)
WBC: 10.4 10*3/uL — ABNORMAL HIGH (ref 3.9–10.3)
lymph#: 2.9 10*3/uL (ref 0.9–3.3)

## 2009-12-30 LAB — COMPREHENSIVE METABOLIC PANEL
ALT: 11 U/L (ref 0–35)
BUN: 14 mg/dL (ref 6–23)
CO2: 26 mEq/L (ref 19–32)
Calcium: 8.8 mg/dL (ref 8.4–10.5)
Chloride: 105 mEq/L (ref 96–112)
Creatinine, Ser: 0.67 mg/dL (ref 0.40–1.20)
Glucose, Bld: 86 mg/dL (ref 70–99)

## 2009-12-30 LAB — PROTIME-INR

## 2010-01-19 ENCOUNTER — Ambulatory Visit: Payer: Self-pay | Admitting: Oncology

## 2010-01-20 LAB — CANCER ANTIGEN 27.29: CA 27.29: 18 U/mL (ref 0–39)

## 2010-01-20 LAB — COMPREHENSIVE METABOLIC PANEL
BUN: 17 mg/dL (ref 6–23)
CO2: 21 mEq/L (ref 19–32)
Creatinine, Ser: 0.87 mg/dL (ref 0.40–1.20)
Glucose, Bld: 106 mg/dL — ABNORMAL HIGH (ref 70–99)
Total Bilirubin: 0.4 mg/dL (ref 0.3–1.2)
Total Protein: 7.7 g/dL (ref 6.0–8.3)

## 2010-01-20 LAB — CBC WITH DIFFERENTIAL/PLATELET
BASO%: 0.2 % (ref 0.0–2.0)
EOS%: 2.2 % (ref 0.0–7.0)
HCT: 35.5 % (ref 34.8–46.6)
LYMPH%: 35.4 % (ref 14.0–49.7)
MCH: 26.5 pg (ref 25.1–34.0)
MCHC: 34.6 g/dL (ref 31.5–36.0)
MONO%: 7.2 % (ref 0.0–14.0)
NEUT%: 55 % (ref 38.4–76.8)
Platelets: 244 10*3/uL (ref 145–400)
RBC: 4.65 10*6/uL (ref 3.70–5.45)

## 2010-02-07 ENCOUNTER — Ambulatory Visit: Admission: RE | Admit: 2010-02-07 | Discharge: 2010-02-07 | Payer: Self-pay | Admitting: Oncology

## 2010-02-07 ENCOUNTER — Encounter: Payer: Self-pay | Admitting: Oncology

## 2010-02-07 ENCOUNTER — Ambulatory Visit: Payer: Self-pay | Admitting: Internal Medicine

## 2010-02-10 LAB — COMPREHENSIVE METABOLIC PANEL
AST: 17 U/L (ref 0–37)
Alkaline Phosphatase: 82 U/L (ref 39–117)
BUN: 11 mg/dL (ref 6–23)
Creatinine, Ser: 0.83 mg/dL (ref 0.40–1.20)
Total Bilirubin: 0.6 mg/dL (ref 0.3–1.2)

## 2010-02-10 LAB — CBC WITH DIFFERENTIAL/PLATELET
BASO%: 0.3 % (ref 0.0–2.0)
EOS%: 2.4 % (ref 0.0–7.0)
HGB: 11 g/dL — ABNORMAL LOW (ref 11.6–15.9)
MCH: 26.4 pg (ref 25.1–34.0)
MCHC: 34.2 g/dL (ref 31.5–36.0)
MONO#: 0.6 10*3/uL (ref 0.1–0.9)
RDW: 15.9 % — ABNORMAL HIGH (ref 11.2–14.5)
WBC: 9.4 10*3/uL (ref 3.9–10.3)
lymph#: 3.2 10*3/uL (ref 0.9–3.3)

## 2010-02-10 LAB — PROTIME-INR
INR: 1.7 — ABNORMAL LOW (ref 2.00–3.50)
Protime: 20.4 Seconds — ABNORMAL HIGH (ref 10.6–13.4)

## 2010-02-16 ENCOUNTER — Ambulatory Visit (HOSPITAL_COMMUNITY): Admission: RE | Admit: 2010-02-16 | Discharge: 2010-02-16 | Payer: Self-pay | Admitting: Oncology

## 2010-03-01 ENCOUNTER — Ambulatory Visit: Payer: Self-pay | Admitting: Oncology

## 2010-03-03 LAB — CBC WITH DIFFERENTIAL/PLATELET
BASO%: 0.3 % (ref 0.0–2.0)
Basophils Absolute: 0 10*3/uL (ref 0.0–0.1)
EOS%: 2.5 % (ref 0.0–7.0)
HCT: 33 % — ABNORMAL LOW (ref 34.8–46.6)
HGB: 11.4 g/dL — ABNORMAL LOW (ref 11.6–15.9)
MCH: 26.6 pg (ref 25.1–34.0)
MONO#: 0.6 10*3/uL (ref 0.1–0.9)
NEUT%: 54.4 % (ref 38.4–76.8)
RDW: 16.5 % — ABNORMAL HIGH (ref 11.2–14.5)
WBC: 9 10*3/uL (ref 3.9–10.3)
lymph#: 3.3 10*3/uL (ref 0.9–3.3)

## 2010-03-03 LAB — COMPREHENSIVE METABOLIC PANEL
ALT: 11 U/L (ref 0–35)
AST: 16 U/L (ref 0–37)
Alkaline Phosphatase: 82 U/L (ref 39–117)
BUN: 14 mg/dL (ref 6–23)
Calcium: 9.1 mg/dL (ref 8.4–10.5)
Creatinine, Ser: 0.97 mg/dL (ref 0.40–1.20)
Total Bilirubin: 0.6 mg/dL (ref 0.3–1.2)

## 2010-03-03 LAB — PROTIME-INR: INR: 1.9 — ABNORMAL LOW (ref 2.00–3.50)

## 2010-03-04 LAB — CREATININE WITH EST GFR
GFR, Est African American: >60 mL/min (ref >60)
GFR, Est Non African American: 58 mL/min — ABNORMAL LOW (ref >60)

## 2010-03-24 LAB — CBC WITH DIFFERENTIAL/PLATELET
Basophils Absolute: 0.1 10*3/uL (ref 0.0–0.1)
EOS%: 2 % (ref 0.0–7.0)
HCT: 34 % — ABNORMAL LOW (ref 34.8–46.6)
HGB: 11.7 g/dL (ref 11.6–15.9)
MCH: 27.2 pg (ref 25.1–34.0)
MONO#: 0.3 10*3/uL (ref 0.1–0.9)
NEUT#: 6.2 10*3/uL (ref 1.5–6.5)
RDW: 16.5 % — ABNORMAL HIGH (ref 11.2–14.5)
WBC: 9.6 10*3/uL (ref 3.9–10.3)
lymph#: 2.8 10*3/uL (ref 0.9–3.3)

## 2010-03-24 LAB — PROTIME-INR: INR: 2.6 (ref 2.00–3.50)

## 2010-04-12 ENCOUNTER — Ambulatory Visit: Payer: Self-pay | Admitting: Oncology

## 2010-04-14 LAB — COMPREHENSIVE METABOLIC PANEL
ALT: 11 U/L (ref 0–35)
Alkaline Phosphatase: 90 U/L (ref 39–117)
Creatinine, Ser: 0.89 mg/dL (ref 0.40–1.20)
Glucose, Bld: 113 mg/dL — ABNORMAL HIGH (ref 70–99)
Sodium: 140 mEq/L (ref 135–145)
Total Bilirubin: 0.7 mg/dL (ref 0.3–1.2)
Total Protein: 7.9 g/dL (ref 6.0–8.3)

## 2010-04-14 LAB — CBC WITH DIFFERENTIAL/PLATELET
Eosinophils Absolute: 0.1 10*3/uL (ref 0.0–0.5)
HCT: 35.8 % (ref 34.8–46.6)
LYMPH%: 30.3 % (ref 14.0–49.7)
MCV: 77.2 fL — ABNORMAL LOW (ref 79.5–101.0)
MONO#: 0.6 10*3/uL (ref 0.1–0.9)
MONO%: 7.2 % (ref 0.0–14.0)
NEUT#: 4.9 10*3/uL (ref 1.5–6.5)
NEUT%: 60.9 % (ref 38.4–76.8)
Platelets: 255 10*3/uL (ref 145–400)
RBC: 4.64 10*6/uL (ref 3.70–5.45)

## 2010-04-14 LAB — PROTIME-INR
INR: 2.3 (ref 2.00–3.50)
Protime: 27.6 Seconds — ABNORMAL HIGH (ref 10.6–13.4)

## 2010-04-15 LAB — CREATININE WITH EST GFR: Creatinine, Ser: 0.87 mg/dL (ref 0.40–1.20)

## 2010-04-21 LAB — CBC WITH DIFFERENTIAL/PLATELET
Basophils Absolute: 0 10*3/uL (ref 0.0–0.1)
Eosinophils Absolute: 0.2 10*3/uL (ref 0.0–0.5)
HGB: 11 g/dL — ABNORMAL LOW (ref 11.6–15.9)
MCV: 77.2 fL — ABNORMAL LOW (ref 79.5–101.0)
MONO#: 0.6 10*3/uL (ref 0.1–0.9)
MONO%: 7.4 % (ref 0.0–14.0)
NEUT#: 4.7 10*3/uL (ref 1.5–6.5)
RBC: 4.16 10*6/uL (ref 3.70–5.45)
RDW: 16 % — ABNORMAL HIGH (ref 11.2–14.5)
WBC: 7.9 10*3/uL (ref 3.9–10.3)
lymph#: 2.4 10*3/uL (ref 0.9–3.3)
nRBC: 0 % (ref 0–0)

## 2010-04-21 LAB — CANCER ANTIGEN 27.29: CA 27.29: 23 U/mL (ref 0–39)

## 2010-05-05 LAB — CBC WITH DIFFERENTIAL/PLATELET
Basophils Absolute: 0 10*3/uL (ref 0.0–0.1)
EOS%: 3.3 % (ref 0.0–7.0)
HGB: 10.8 g/dL — ABNORMAL LOW (ref 11.6–15.9)
MCH: 26.8 pg (ref 25.1–34.0)
NEUT#: 3.6 10*3/uL (ref 1.5–6.5)
RDW: 16.1 % — ABNORMAL HIGH (ref 11.2–14.5)
lymph#: 3.2 10*3/uL (ref 0.9–3.3)

## 2010-05-05 LAB — PROTIME-INR: INR: 3.2 (ref 2.00–3.50)

## 2010-05-09 ENCOUNTER — Ambulatory Visit: Admission: RE | Admit: 2010-05-09 | Discharge: 2010-05-09 | Payer: Self-pay | Admitting: Oncology

## 2010-05-09 ENCOUNTER — Encounter: Payer: Self-pay | Admitting: Oncology

## 2010-05-09 LAB — CBC WITH DIFFERENTIAL/PLATELET
BASO%: 0.3 % (ref 0.0–2.0)
Basophils Absolute: 0 10*3/uL (ref 0.0–0.1)
EOS%: 2.6 % (ref 0.0–7.0)
HGB: 10.6 g/dL — ABNORMAL LOW (ref 11.6–15.9)
MCH: 26.8 pg (ref 25.1–34.0)
MCHC: 34.5 g/dL (ref 31.5–36.0)
RBC: 3.95 10*6/uL (ref 3.70–5.45)
RDW: 16.2 % — ABNORMAL HIGH (ref 11.2–14.5)
lymph#: 2.3 10*3/uL (ref 0.9–3.3)

## 2010-05-09 LAB — PROTIME-INR
INR: 3.2 (ref 2.00–3.50)
Protime: 38.4 Seconds — ABNORMAL HIGH (ref 10.6–13.4)

## 2010-05-09 LAB — COMPREHENSIVE METABOLIC PANEL
ALT: 10 U/L (ref 0–35)
AST: 13 U/L (ref 0–37)
Chloride: 105 mEq/L (ref 96–112)
Potassium: 3.6 mEq/L (ref 3.5–5.3)
Sodium: 142 mEq/L (ref 135–145)

## 2010-05-24 ENCOUNTER — Ambulatory Visit: Payer: Self-pay | Admitting: Oncology

## 2010-05-26 LAB — COMPREHENSIVE METABOLIC PANEL
ALT: 9 U/L (ref 0–35)
AST: 13 U/L (ref 0–37)
CO2: 26 mEq/L (ref 19–32)
Calcium: 8.9 mg/dL (ref 8.4–10.5)
Chloride: 105 mEq/L (ref 96–112)
Sodium: 141 mEq/L (ref 135–145)
Total Protein: 7.6 g/dL (ref 6.0–8.3)

## 2010-05-26 LAB — CBC WITH DIFFERENTIAL/PLATELET
Basophils Absolute: 0.1 10*3/uL (ref 0.0–0.1)
Eosinophils Absolute: 0.2 10*3/uL (ref 0.0–0.5)
HCT: 34.4 % — ABNORMAL LOW (ref 34.8–46.6)
HGB: 11.8 g/dL (ref 11.6–15.9)
MONO#: 0.6 10*3/uL (ref 0.1–0.9)
NEUT#: 4.2 10*3/uL (ref 1.5–6.5)
NEUT%: 55.1 % (ref 38.4–76.8)
WBC: 7.7 10*3/uL (ref 3.9–10.3)
lymph#: 2.6 10*3/uL (ref 0.9–3.3)

## 2010-05-26 LAB — PROTIME-INR

## 2010-07-05 ENCOUNTER — Ambulatory Visit: Payer: Self-pay | Admitting: Oncology

## 2010-07-07 LAB — COMPREHENSIVE METABOLIC PANEL
ALT: 9 U/L (ref 0–35)
AST: 12 U/L (ref 0–37)
Creatinine, Ser: 0.78 mg/dL (ref 0.40–1.20)
Total Bilirubin: 0.7 mg/dL (ref 0.3–1.2)

## 2010-07-07 LAB — PROTIME-INR: INR: 1.6 — ABNORMAL LOW (ref 2.00–3.50)

## 2010-07-07 LAB — CBC WITH DIFFERENTIAL/PLATELET
BASO%: 0.2 % (ref 0.0–2.0)
EOS%: 3.5 % (ref 0.0–7.0)
HGB: 11.8 g/dL (ref 11.6–15.9)
MCH: 27.9 pg (ref 25.1–34.0)
MCHC: 34.5 g/dL (ref 31.5–36.0)
RBC: 4.21 10*6/uL (ref 3.70–5.45)
RDW: 16 % — ABNORMAL HIGH (ref 11.2–14.5)
lymph#: 2 10*3/uL (ref 0.9–3.3)

## 2010-07-28 LAB — CBC WITH DIFFERENTIAL/PLATELET
Basophils Absolute: 0 10*3/uL (ref 0.0–0.1)
EOS%: 3.5 % (ref 0.0–7.0)
HCT: 33.9 % — ABNORMAL LOW (ref 34.8–46.6)
HGB: 11.9 g/dL (ref 11.6–15.9)
MCH: 26.9 pg (ref 25.1–34.0)
MONO#: 0.8 10*3/uL (ref 0.1–0.9)
NEUT%: 51.7 % (ref 38.4–76.8)
lymph#: 3.3 10*3/uL (ref 0.9–3.3)

## 2010-07-28 LAB — PROTIME-INR

## 2010-08-08 ENCOUNTER — Ambulatory Visit
Admission: RE | Admit: 2010-08-08 | Discharge: 2010-08-08 | Payer: Self-pay | Source: Home / Self Care | Attending: Oncology | Admitting: Oncology

## 2010-08-08 ENCOUNTER — Encounter: Payer: Self-pay | Admitting: Oncology

## 2010-08-17 ENCOUNTER — Ambulatory Visit: Payer: Self-pay | Admitting: Oncology

## 2010-08-18 LAB — COMPREHENSIVE METABOLIC PANEL
ALT: 11 U/L (ref 0–35)
Albumin: 3.7 g/dL (ref 3.5–5.2)
CO2: 26 mEq/L (ref 19–32)
Calcium: 8.8 mg/dL (ref 8.4–10.5)
Chloride: 104 mEq/L (ref 96–112)
Sodium: 142 mEq/L (ref 135–145)
Total Protein: 7.9 g/dL (ref 6.0–8.3)

## 2010-08-18 LAB — CBC WITH DIFFERENTIAL/PLATELET
Basophils Absolute: 0 10*3/uL (ref 0.0–0.1)
Eosinophils Absolute: 0.1 10*3/uL (ref 0.0–0.5)
LYMPH%: 29.5 % (ref 14.0–49.7)
MCH: 26.8 pg (ref 25.1–34.0)
MCHC: 35.1 g/dL (ref 31.5–36.0)
MONO%: 6.9 % (ref 0.0–14.0)
NEUT%: 61.8 % (ref 38.4–76.8)
RDW: 15 % — ABNORMAL HIGH (ref 11.2–14.5)
lymph#: 2.1 10*3/uL (ref 0.9–3.3)

## 2010-08-18 LAB — PROTIME-INR: Protime: 36 Seconds — ABNORMAL HIGH (ref 10.6–13.4)

## 2010-08-18 LAB — CANCER ANTIGEN 27.29: CA 27.29: 24 U/mL (ref 0–39)

## 2010-09-01 ENCOUNTER — Ambulatory Visit (HOSPITAL_COMMUNITY)
Admission: RE | Admit: 2010-09-01 | Discharge: 2010-09-01 | Payer: Self-pay | Source: Home / Self Care | Attending: Oncology | Admitting: Oncology

## 2010-09-01 LAB — GLUCOSE, CAPILLARY: Glucose-Capillary: 123 mg/dL — ABNORMAL HIGH (ref 70–99)

## 2010-09-07 LAB — PROTIME-INR
INR: 1.9 — ABNORMAL LOW (ref 2.00–3.50)
Protime: 22.8 Seconds — ABNORMAL HIGH (ref 10.6–13.4)

## 2010-09-07 LAB — CBC WITH DIFFERENTIAL/PLATELET
BASO%: 0.5 % (ref 0.0–2.0)
Basophils Absolute: 0.1 10*3/uL (ref 0.0–0.1)
EOS%: 4.3 % (ref 0.0–7.0)
Eosinophils Absolute: 0.4 10*3/uL (ref 0.0–0.5)
HCT: 32.1 % — ABNORMAL LOW (ref 34.8–46.6)
HGB: 11 g/dL — ABNORMAL LOW (ref 11.6–15.9)
LYMPH%: 40 % (ref 14.0–49.7)
MCH: 26.7 pg (ref 25.1–34.0)
MCHC: 34.3 g/dL (ref 31.5–36.0)
MCV: 77.9 fL — ABNORMAL LOW (ref 79.5–101.0)
MONO#: 0.7 10*3/uL (ref 0.1–0.9)
MONO%: 6.6 % (ref 0.0–14.0)
NEUT#: 4.8 10*3/uL (ref 1.5–6.5)
NEUT%: 48.6 % (ref 38.4–76.8)
Platelets: 284 10*3/uL (ref 145–400)
RBC: 4.12 10*6/uL (ref 3.70–5.45)
RDW: 15 % — ABNORMAL HIGH (ref 11.2–14.5)
WBC: 9.8 10*3/uL (ref 3.9–10.3)
lymph#: 3.9 10*3/uL — ABNORMAL HIGH (ref 0.9–3.3)
nRBC: 0 % (ref 0–0)

## 2010-09-16 ENCOUNTER — Encounter: Payer: Self-pay | Admitting: Oncology

## 2010-09-17 ENCOUNTER — Encounter: Payer: Self-pay | Admitting: Oncology

## 2010-09-27 ENCOUNTER — Encounter: Payer: Self-pay | Admitting: Oncology

## 2010-09-29 ENCOUNTER — Encounter (HOSPITAL_BASED_OUTPATIENT_CLINIC_OR_DEPARTMENT_OTHER): Payer: MEDICARE | Admitting: Oncology

## 2010-09-29 ENCOUNTER — Other Ambulatory Visit: Payer: Self-pay | Admitting: Oncology

## 2010-09-29 DIAGNOSIS — C50419 Malignant neoplasm of upper-outer quadrant of unspecified female breast: Secondary | ICD-10-CM

## 2010-09-29 DIAGNOSIS — Z5112 Encounter for antineoplastic immunotherapy: Secondary | ICD-10-CM

## 2010-09-29 LAB — PROTIME-INR: Protime: 38.4 Seconds — ABNORMAL HIGH (ref 10.6–13.4)

## 2010-10-09 ENCOUNTER — Other Ambulatory Visit: Payer: Self-pay | Admitting: Oncology

## 2010-10-09 ENCOUNTER — Encounter (HOSPITAL_BASED_OUTPATIENT_CLINIC_OR_DEPARTMENT_OTHER): Payer: MEDICARE | Admitting: Oncology

## 2010-10-09 DIAGNOSIS — C50419 Malignant neoplasm of upper-outer quadrant of unspecified female breast: Secondary | ICD-10-CM

## 2010-10-09 LAB — COMPREHENSIVE METABOLIC PANEL
AST: 48 U/L — ABNORMAL HIGH (ref 0–37)
Albumin: 3.7 g/dL (ref 3.5–5.2)
BUN: 38 mg/dL — ABNORMAL HIGH (ref 6–23)
CO2: 30 mEq/L (ref 19–32)
Calcium: 9 mg/dL (ref 8.4–10.5)
Chloride: 98 mEq/L (ref 96–112)
Glucose, Bld: 106 mg/dL — ABNORMAL HIGH (ref 70–99)
Potassium: 3.4 mEq/L — ABNORMAL LOW (ref 3.5–5.3)

## 2010-10-09 LAB — CBC WITH DIFFERENTIAL/PLATELET
Basophils Absolute: 0 10*3/uL (ref 0.0–0.1)
Eosinophils Absolute: 0.4 10*3/uL (ref 0.0–0.5)
HGB: 9.8 g/dL — ABNORMAL LOW (ref 11.6–15.9)
NEUT#: 7.1 10*3/uL — ABNORMAL HIGH (ref 1.5–6.5)
RDW: 16.2 % — ABNORMAL HIGH (ref 11.2–14.5)
lymph#: 2.6 10*3/uL (ref 0.9–3.3)

## 2010-10-09 LAB — PROTIME-INR: INR: 3.5 (ref 2.00–3.50)

## 2010-10-20 ENCOUNTER — Other Ambulatory Visit: Payer: Self-pay | Admitting: Physician Assistant

## 2010-10-20 ENCOUNTER — Encounter (HOSPITAL_BASED_OUTPATIENT_CLINIC_OR_DEPARTMENT_OTHER): Payer: MEDICARE | Admitting: Oncology

## 2010-10-20 DIAGNOSIS — Z5112 Encounter for antineoplastic immunotherapy: Secondary | ICD-10-CM

## 2010-10-20 DIAGNOSIS — C50419 Malignant neoplasm of upper-outer quadrant of unspecified female breast: Secondary | ICD-10-CM

## 2010-10-20 LAB — LACTATE DEHYDROGENASE: LDH: 184 U/L (ref 94–250)

## 2010-10-20 LAB — CBC WITH DIFFERENTIAL/PLATELET
BASO%: 0.2 % (ref 0.0–2.0)
Basophils Absolute: 0 10*3/uL (ref 0.0–0.1)
HCT: 28.4 % — ABNORMAL LOW (ref 34.8–46.6)
HGB: 9.9 g/dL — ABNORMAL LOW (ref 11.6–15.9)
LYMPH%: 37.5 % (ref 14.0–49.7)
MCH: 26.7 pg (ref 25.1–34.0)
MCHC: 34.9 g/dL (ref 31.5–36.0)
MONO#: 0.7 10*3/uL (ref 0.1–0.9)
NEUT%: 51.3 % (ref 38.4–76.8)
Platelets: 287 10*3/uL (ref 145–400)
WBC: 9.3 10*3/uL (ref 3.9–10.3)

## 2010-10-20 LAB — COMPREHENSIVE METABOLIC PANEL
ALT: 11 U/L (ref 0–35)
AST: 14 U/L (ref 0–37)
Albumin: 3.5 g/dL (ref 3.5–5.2)
CO2: 26 mEq/L (ref 19–32)
Calcium: 8.8 mg/dL (ref 8.4–10.5)
Chloride: 101 mEq/L (ref 96–112)
Creatinine, Ser: 1 mg/dL (ref 0.40–1.20)
Potassium: 3.5 mEq/L (ref 3.5–5.3)
Total Protein: 7.4 g/dL (ref 6.0–8.3)

## 2010-10-20 LAB — IRON AND TIBC: %SAT: 15 % — ABNORMAL LOW (ref 20–55)

## 2010-10-20 LAB — FERRITIN: Ferritin: 41 ng/mL (ref 10–291)

## 2010-10-20 LAB — PROTIME-INR

## 2010-11-07 ENCOUNTER — Ambulatory Visit (HOSPITAL_COMMUNITY)
Admission: RE | Admit: 2010-11-07 | Discharge: 2010-11-07 | Disposition: A | Discharge status: Home / Self Care | Payer: MEDICARE | Source: Ambulatory Visit | Attending: Oncology | Admitting: Oncology

## 2010-11-07 DIAGNOSIS — I059 Rheumatic mitral valve disease, unspecified: Secondary | ICD-10-CM | POA: Insufficient documentation

## 2010-11-10 ENCOUNTER — Other Ambulatory Visit: Payer: Self-pay | Admitting: Physician Assistant

## 2010-11-10 ENCOUNTER — Encounter (HOSPITAL_BASED_OUTPATIENT_CLINIC_OR_DEPARTMENT_OTHER): Payer: MEDICARE | Admitting: Oncology

## 2010-11-10 DIAGNOSIS — Z7901 Long term (current) use of anticoagulants: Secondary | ICD-10-CM

## 2010-11-10 DIAGNOSIS — Z5112 Encounter for antineoplastic immunotherapy: Secondary | ICD-10-CM

## 2010-11-10 DIAGNOSIS — C50419 Malignant neoplasm of upper-outer quadrant of unspecified female breast: Secondary | ICD-10-CM

## 2010-11-10 LAB — CBC WITH DIFFERENTIAL/PLATELET
BASO%: 0.4 % (ref 0.0–2.0)
EOS%: 2.6 % (ref 0.0–7.0)
MCH: 26.6 pg (ref 25.1–34.0)
MCHC: 34.8 g/dL (ref 31.5–36.0)
RBC: 3.98 10*6/uL (ref 3.70–5.45)
RDW: 15.9 % — ABNORMAL HIGH (ref 11.2–14.5)
lymph#: 2.6 10*3/uL (ref 0.9–3.3)

## 2010-11-10 LAB — PROTIME-INR: INR: 1.2 — ABNORMAL LOW (ref 2.00–3.50)

## 2010-11-10 LAB — COMPREHENSIVE METABOLIC PANEL
AST: 13 U/L (ref 0–37)
Albumin: 3.9 g/dL (ref 3.5–5.2)
Alkaline Phosphatase: 78 U/L (ref 39–117)
BUN: 21 mg/dL (ref 6–23)
Potassium: 3.4 mEq/L — ABNORMAL LOW (ref 3.5–5.3)
Sodium: 138 mEq/L (ref 135–145)
Total Protein: 8.2 g/dL (ref 6.0–8.3)

## 2010-11-10 LAB — IRON AND TIBC: TIBC: 343 ug/dL (ref 250–470)

## 2010-11-27 LAB — GLUCOSE, CAPILLARY: Glucose-Capillary: 95 mg/dL (ref 70–99)

## 2010-12-01 ENCOUNTER — Encounter (HOSPITAL_BASED_OUTPATIENT_CLINIC_OR_DEPARTMENT_OTHER): Payer: MEDICARE | Admitting: Oncology

## 2010-12-01 ENCOUNTER — Other Ambulatory Visit: Payer: Self-pay | Admitting: Physician Assistant

## 2010-12-01 DIAGNOSIS — C50419 Malignant neoplasm of upper-outer quadrant of unspecified female breast: Secondary | ICD-10-CM

## 2010-12-01 DIAGNOSIS — Z5112 Encounter for antineoplastic immunotherapy: Secondary | ICD-10-CM

## 2010-12-01 LAB — PROTIME-INR: Protime: 15.6 Seconds — ABNORMAL HIGH (ref 10.6–13.4)

## 2010-12-01 LAB — LACTATE DEHYDROGENASE: LDH: 200 U/L (ref 94–250)

## 2010-12-01 LAB — CBC WITH DIFFERENTIAL/PLATELET
BASO%: 0.4 % (ref 0.0–2.0)
MCHC: 34.8 g/dL (ref 31.5–36.0)
MONO#: 0.6 10*3/uL (ref 0.1–0.9)
RBC: 3.87 10*6/uL (ref 3.70–5.45)
RDW: 16 % — ABNORMAL HIGH (ref 11.2–14.5)
WBC: 8.2 10*3/uL (ref 3.9–10.3)
lymph#: 3.1 10*3/uL (ref 0.9–3.3)

## 2010-12-01 LAB — COMPREHENSIVE METABOLIC PANEL
ALT: <8 U/L (ref 0–35)
CO2: 22 mEq/L (ref 19–32)
Calcium: 8.9 mg/dL (ref 8.4–10.5)
Chloride: 107 mEq/L (ref 96–112)
Sodium: 144 mEq/L (ref 135–145)
Total Protein: 6.8 g/dL (ref 6.0–8.3)

## 2010-12-22 ENCOUNTER — Other Ambulatory Visit: Payer: Self-pay | Admitting: Oncology

## 2010-12-22 ENCOUNTER — Encounter (HOSPITAL_BASED_OUTPATIENT_CLINIC_OR_DEPARTMENT_OTHER): Payer: MEDICARE | Admitting: Oncology

## 2010-12-22 DIAGNOSIS — Z5112 Encounter for antineoplastic immunotherapy: Secondary | ICD-10-CM

## 2010-12-22 DIAGNOSIS — Z7901 Long term (current) use of anticoagulants: Secondary | ICD-10-CM

## 2010-12-22 DIAGNOSIS — C50419 Malignant neoplasm of upper-outer quadrant of unspecified female breast: Secondary | ICD-10-CM

## 2010-12-22 LAB — COMPREHENSIVE METABOLIC PANEL
Albumin: 4 g/dL (ref 3.5–5.2)
CO2: 24 mEq/L (ref 19–32)
Calcium: 9.1 mg/dL (ref 8.4–10.5)
Chloride: 100 mEq/L (ref 96–112)
Glucose, Bld: 107 mg/dL — ABNORMAL HIGH (ref 70–99)
Sodium: 139 mEq/L (ref 135–145)
Total Bilirubin: 0.4 mg/dL (ref 0.3–1.2)
Total Protein: 8.4 g/dL — ABNORMAL HIGH (ref 6.0–8.3)

## 2010-12-22 LAB — CBC WITH DIFFERENTIAL/PLATELET
Eosinophils Absolute: 0.3 10*3/uL (ref 0.0–0.5)
HCT: 31.5 % — ABNORMAL LOW (ref 34.8–46.6)
LYMPH%: 35.9 % (ref 14.0–49.7)
MONO#: 0.8 10*3/uL (ref 0.1–0.9)
NEUT#: 5 10*3/uL (ref 1.5–6.5)
Platelets: 320 10*3/uL (ref 145–400)
RBC: 4.08 10*6/uL (ref 3.70–5.45)
WBC: 9.6 10*3/uL (ref 3.9–10.3)
lymph#: 3.5 10*3/uL — ABNORMAL HIGH (ref 0.9–3.3)

## 2010-12-22 LAB — PROTIME-INR: Protime: 36 Seconds — ABNORMAL HIGH (ref 10.6–13.4)

## 2010-12-22 LAB — CANCER ANTIGEN 27.29: CA 27.29: 31 U/mL (ref 0–39)

## 2010-12-22 LAB — LACTATE DEHYDROGENASE: LDH: 174 U/L (ref 94–250)

## 2011-01-09 NOTE — Discharge Summary (Signed)
[name], [name]               ACCOUNT NO.:  [id number]   MEDICAL RECORD NO.:  [id number]          PATIENT TYPE:  INP   LOCATION:  3730                         FACILITY:  [facility]   PHYSICIAN:  Doylene Canning. Ladona Ridgel, MD    DATE OF BIRTH:  [birth year]   DATE OF ADMISSION:  05/29/2008  DATE OF DISCHARGE:  06/02/2008                               DISCHARGE SUMMARY   This patient has an allergy to PENICILLIN.   FINAL DIAGNOSES:  1. Admitted on May 29, 2008, after an abnormal adenosine Myoview      study (mild anteroapical ischemia).  2. Mild elevation in troponin studies, 0.24 then 0.4, and then 0.13.  3. A  2-D echocardiogram on May 27, 2008, ejection fraction 65%, no      left ventricular wall motion abnormalities, trivial mitral      regurgitation, no tricuspid regurgitation.  4. CT of the chest on May 29, 2008, peribronchovascular opacities      in the right lung.  5. Hypokalemia on admission.  The patient has required potassium      supplementation.  6. Left heart catheterization on June 01, 2008, ejection fraction is      60%.  No angiographically evident coronary artery disease  7. The patient is discharging.  She will have chemotherapy treatment      on Thursday, June 03, 2008.   SECONDARY DIAGNOSES:  1. Hypertension.  2. Dyslipidemia.  3. Hiatal hernia.  4. History of breast cancer.      a.     Status post bilateral mastectomies.      b.     Multiple courses of chemotherapy in the past.  Her last       chemo was 1 week ago through a Port-A-Cath.      c.     The patient has metastasis to the pelvis  5. Bilateral tubal ligation in 1986.  6. Status post cholecystectomy in 1989.  7. Peripheral neuropathy.   PROCEDURES DURING THIS ADMISSION:  Included a 2-D echocardiogram on  May 27, 2008, ejection fraction 65%, no left ventricular wall motion  abnormalities, trivial mitral regurgitation, no tricuspid regurgitation.  1. Computed tomogram of the chest on May 29, 2008, the finding of      peribronchovascular opacity in the right lung.  2. Left heart catheterization on June 01, 2008, ejection fraction      preserved at 60%.  No evidence of coronary artery disease, not even      luminal irregularities.   BRIEF HISTORY:  [name] is a 60 year old female.  She is transferred  from [facility] to [facility] for evaluation of elevated  troponin studies and catheterization.   The patient has a history of metastatic breast cancer.  She is status  post bilateral mastectomies.  She has had chemotherapy in the past under  Dr. Donnie Coffin.  She has known metastatic disease to the pelvis.   The patient has no prior cardiac history, but recently she had a MUGA  scan which demonstrated preserved left ventricular function.   On Thursday, May 27, 2008,  she presented with nausea and vomiting.  Her initial cardiac enzymes were elevated, but minimally.  There were no  acute EKG changes noted.  She underwent stress test on May 29, 2008.  There was an area on the adenosine scan, which demonstrated mild  ischemia in the anteroapical area.  The patient is transferring from  [facility] to [facility] for additional  evaluation.   The patient also is notable for having hypertension for many years and a  longstanding history of dyslipidemia.   PLAN:  The patient will be admitted.  Her adenosine Myoview study will  be studied and the patient will based on the results of that study be  scheduled for left heart catheterization.   HOSPITAL COURSE:  The patient presents to [facility] in  transfer from [facility].  She has a background history of  elevated troponin I studies, nausea, and vomiting on admission and an  abnormal adenosine Myoview study.  The patient was found to be  hypokalemic on this admission and received several dose of potassium  chloride.  She also had persistent elevations in her systolic  blood  pressure.  She is maintaining on Norvasc, but she has also been started  on carvedilol at a fairly low dose of 6.25 mg twice daily.  She has been  followed throughout this hospitalization by Dr. Lewayne Bunting who has  corrected her hypokalemia and recommended a left heart catheterization.  This study was done in the Cath Lab on June 01, 2008.  This study  showed that she had an ejection fraction of 60% with normal wall motion  in the LAD of the left circ, and the right coronary artery gave no  angiographic evidence of coronary artery disease.  The patient is  discharging #1 day after the catheterization.  She is to follow up with  Dr. Sudie Bailey and with Dr. Donnie Coffin.   BRIEF DISCUSSION:  At first, I thought that I would discharge the  patient on Coreg 6.25 mg twice daily, low dose for her blood pressure,  but indeed her blood pressure has actually been fairly modest here and  so I think I will just let her go on her Norvasc 5 mg daily.  She will  go home on potassium chloride 20 mEq daily, however, and I would request  that when she sees Dr. Sudie Bailey in the office on Friday, June 11, 2008, and a basic metabolic panel will be taken to monitor that.  Followup appointment as just mentioned is to see Dr. Sudie Bailey on Friday,  June 11, 2008, at 11:15 in the morning.  She will also see Dr. Donnie Coffin  on Thursday, June 03, 2008, for her next round of chemotherapy.   LAB STUDIES DURING THIS ADMISSION:  On the day of discharge, hemoglobin  is 10.1, hematocrit 30.1, white cells 6.9, and platelets are 174.  Serum  electrolytes on the day of discharge:  Sodium 137, potassium 3.7,  chloride 107, carbonate 24, BUN is 10, creatinine 0.84, and glucose is  86.  Protime this admission 16.7, INR is 1.3 and PTT is 42.  Alkaline  phosphatase this admission 76, SGOT 17, and SGPT is 12.  Troponin I  studies were 0.24,  then 0.13.  Total cholesterol 162, LDL cholesterol 110, HDL cholesterol  35, and  triglycerides are 84.  The urinalysis taken this admission was  negative.  TSH was 0.922, T3 at 38.3 which is mildly elevated, and T4 is  within normal limits at 1.27.      Maple Mirza, PA      Doylene Canning. Ladona Ridgel, MD  Electronically Signed    GM/MEDQ  D:  06/02/2008  T:  06/02/2008  Job:  [id number]   cc:   Pierce Crane, MD  [facility]  Doylene Canning. Ladona Ridgel, MD

## 2011-01-09 NOTE — Cardiovascular Report (Signed)
NAME:  TEIGHAN, AUBERT               ACCOUNT NO.:  [id number]   MEDICAL RECORD NO.:  [id number]          PATIENT TYPE:  INP   LOCATION:  3730                         FACILITY:  [facility]   PHYSICIAN:  Verne Carrow, MDDATE OF BIRTH:  1950/11/02   DATE OF PROCEDURE:  06/01/2008  DATE OF DISCHARGE:                            CARDIAC CATHETERIZATION   INDICATIONS:  Chest pain and mild elevation of cardiac enzymes with the  troponin of 0.4 in a young patient with a history of metastatic breast  cancer and presentation to the hospital with nausea and vomiting last  week.   OPERATOR:  Verne Carrow, MD   PROCEDURES PERFORMED:  1. Left heart catheterization.  2. Selective coronary angiography.  3. Left ventricular angiogram.   PROCEDURE IN DETAIL:  The patient was brought to the heart  catheterization laboratory after signing informed consent for the  procedure.  The right groin was prepped and draped in a sterile fashion.  A 5-French sheath was inserted into the right femoral artery.  Standard  Judkins diagnostic catheters were used to perform selective coronary  angiography.  A 5-French pigtail catheter was used to cross the aortic  valve into the left ventricle.  Following the performance of a left  ventricular angiogram, the pigtail catheter was pulled back across the  aortic valve with no significant pressure gradient measured.  The sheath  will be removed in the holding area and the patient will have 3 hours of  bed rest with manual pressure applied for hemostasis.   ANGIOGRAPHIC FINDINGS:  There is no angiographic evidence of coronary  artery disease.  The left anterior descending is a vessel that courses  to the apex and gives off a large diagonal branch.  The circumflex has a  very high small obtuse marginal branch followed by a very large  branching second obtuse marginal branch.  The right coronary artery is a  dominant vessel that has a posterior descending  branch.   The left ventricular angiogram demonstrated normal left ventricular  systolic function with an ejection fraction of 60%.  There were no wall  motion abnormalities noted.   HEMODYNAMIC DATA:  Left ventricular pressure 109/10, end-diastolic  pressure 15, central aortic pressure 119/76.   IMPRESSION:  1. No angiographic evidence of coronary artery disease.  2. Normal left ventricular systolic function.   RECOMMENDATIONS:  No further cardiac workup of elevated cardiac enzymes  is indicated.      Verne Carrow, MD  Electronically Signed     CM/MEDQ  D:  06/01/2008  T:  06/02/2008  Job:  [id number]   cc:   Doylene Canning. Ladona Ridgel, MD

## 2011-01-09 NOTE — H&P (Signed)
[name], [name]               ACCOUNT NO.:  [id number]   MEDICAL RECORD NO.:  [id number]          PATIENT TYPE:  INP   LOCATION:  3730                         FACILITY:  [facility]   PHYSICIAN:  Doylene Canning. Ladona Ridgel, MD    DATE OF BIRTH:  [birth year]   DATE OF ADMISSION:  05/29/2008  DATE OF DISCHARGE:                              HISTORY & PHYSICAL   The patient is transferred here from [facility] by Dr.  Bing Matter for evaluation and consideration for catheterization.  She is  a very pleasant 60 year old woman with a history of metastatic breast  cancer.  She is status post bilateral mastectomy and has been on  multiple chemotherapeutic regimens under the direction of Dr. Donnie Coffin.  She has known metastatic disease in her pelvis.  The patient has not  followed from a cardiac perspective with MUGA scan, which most recently  demonstrated preserved LV function.  She was in her usual state of  health until this past Thursday when she presented with nausea and  vomiting and initial cardiac enzymes were minimally elevated.  There  were no acute EKG changes demonstrated.  The patient's nausea and  vomiting improved.  It is unclear whether this represented her anginal  equivalent.  Her EKG was nondiagnostic.  She underwent stress testing  today.  Dr. Bing Matter called me when there was an area on the adenosine  scan demonstrating mild ischemia.  It was described as being in the  anterior apical region.  She refused transfer to [facility] and  was requested to be transferred to [facility] for ongoing additional  evaluation.   Her additional past medical history is notable for hypertension for many  years.  She has a history of bilateral tubal ligations in the past.  She  has had a longstanding history (20 years) of hyperlipidemia.  She has a  history of hiatal hernia.  She has undergone multiple rounds of  chemotherapy for her breast cancer.   The patient is married and she  lives with her husband.  She is disabled.  She denies tobacco or ethanol abuse.   Family history is notable for mother dying of questionable complications  of brain cancer and father died of an MI approximately 10 years ago.  She has multiple siblings with coronary artery disease and diabetes.   Her review of systems is negative except as noted in the HPI other than  she wears glasses for visual acuity.  She complains of neuropathy in her  hands and feet, and she has nausea and vomiting most recently.  Otherwise, all systems were reviewed and negative except for some I  think pain in her pelvis.   PHYSICAL EXAMINATION:  GENERAL:  Notable for her being a pleasant, but  sleepy middle-aged obese woman in no acute distress.  VITAL SIGNS:  Her blood pressure was 170/120.  The pulse was 80 and  regular.  The respirations were 20-24.  Temperature was 100.  HEENT:  Normocephalic and atraumatic.  Pupils were equal and round.  The  oropharynx was moist.  The sclerae were anicteric.  NECK:  No jugular venous distention.  There were no thyromegaly.  The  trachea was midline.  The carotids were 2+ and symmetric.  LUNGS:  Clear bilaterally to auscultation.  There were no wheezes,  rales, or rhonchi present.  There was no increased work of breathing.  CARDIOVASCULAR:  Regular rate and rhythm.  Normal S1 and S2.  A soft S4  gallop was present.  The PMI was not enlarged nor was it laterally  displaced.  ABDOMEN:  Obese, nontender, and nondistended.  There was no obvious  organomegaly.  There was no rebound or guarding.  Bowel sounds were  present.  EXTREMITIES:  No cyanosis, clubbing, or edema.  Pulses were 2+ and  symmetric.  NEUROLOGIC:  The patient was somnolent.  She moved all 4 extremities.  She answered questions appropriately, then fell back asleep.   IMPRESSION:  1. New-onset nausea and vomiting in the setting of borderline positive      troponins and positive adenosine Myoview scan.  2.  Metastatic breast cancer, on chemotherapy with chemotherapy      scheduled for this coming week.  3. Hypertension, very poorly controlled.  4. Anemia with a low MCV.   DISCUSSION:  We will plan on admitting the patient to the hospital.  I  will tentatively schedule cardiac catheterization, but I would like to  get more information regarding the findings on her stress test.  Because  the patient is somnolent and is unable to give me a very good clinical  history, I would like to chart with her more before we decide for  certain on catheterization though in fact she was transferred here for  this to be carried out.      Doylene Canning. Ladona Ridgel, MD  Electronically Signed     GWT/MEDQ  D:  05/29/2008  T:  05/29/2008  Job:  [id number]   cc:   Pierce Crane, MD

## 2011-01-12 ENCOUNTER — Other Ambulatory Visit: Payer: Self-pay | Admitting: Oncology

## 2011-01-12 ENCOUNTER — Encounter (HOSPITAL_BASED_OUTPATIENT_CLINIC_OR_DEPARTMENT_OTHER): Payer: Medicare Other | Admitting: Oncology

## 2011-01-12 DIAGNOSIS — Z5112 Encounter for antineoplastic immunotherapy: Secondary | ICD-10-CM

## 2011-01-12 DIAGNOSIS — C50419 Malignant neoplasm of upper-outer quadrant of unspecified female breast: Secondary | ICD-10-CM

## 2011-01-12 DIAGNOSIS — Z7901 Long term (current) use of anticoagulants: Secondary | ICD-10-CM

## 2011-01-12 NOTE — Op Note (Signed)
[name], [name]               ACCOUNT NO.:  [id number]   MEDICAL RECORD NO.:  [id number]          PATIENT TYPE:  AMB   LOCATION:  DSC                          FACILITY:  [facility]   PHYSICIAN:  Currie Paris, M.D.DATE OF BIRTH:  Sep 27, 1950   DATE OF PROCEDURE:  05/30/2005  DATE OF DISCHARGE:                                 OPERATIVE REPORT   [facility] [phone]   PREOPERATIVE DIAGNOSIS:  Status post bilateral mastectomies with chronic  right seroma cavity and bilateral extraneous tissue at lateral edges of  scar.   POSTOPERATIVE DIAGNOSIS:  Status post bilateral mastectomies with chronic  right seroma cavity and bilateral extraneous tissue at lateral edges of  scar.   PROCEDURE:  Excision of chronic right seroma cavity with bilateral excision  of excess scar skin and primary closure.   CLINICAL HISTORY:  [name] has had bilateral mastectomies and had dog  ears at the lateral ends of both incisions, predominantly on the left.  She  also had a chronic seroma cavity on the right.  After her mastectomies and  the drains removed, the seroma cavity has remained chronic and she went  through her radiation therapy.  Because of these changes, she desired to  have the excess skin excised so that she could have more comfort around the  armpit areas as well getting rid of the chronic seroma cavity.   DESCRIPTION OF PROCEDURE:  The patient was seen in the holding area and had  no further questions.  We had marked both the areas to be removed.  She was  taken to the operating room and after satisfactory general anesthesia had  been obtained, she was positioned slightly right side down and the left  mastectomy site prepped and draped.  The time-out occurred, confirming that we were going to do bilateral  excisions.  I had already marked with the arm at this side the excess skin,  so I made that excision.  This was done to try to get a primary closure  medially and get this to flatten out.   Cautery was used to excise skin and  subcutaneous tissue after the skin incision was made with a knife.  This  produced a marked flattening and reduction in the dog ear although there  was still some tissue laterally that because of her obesity I did not think  we could remove but that would be primarily out the way of her arm when it  was at her side.   Once everything was dry, the incision was closed with some 2-0 Vicryl subcu  and staples on the skin.   The patient was repositioned to approach the right side, and it was also  prepped and draped.  I again made an elliptical incision to take out the  excess skin and subcu laterally and entered the chronic seroma cavity.  Having entered that, I went ahead using the cautery and excised the capsule  of the seroma cavity so we would have fresh tissue to try to get to heal and  the reduce the chances of a long-term seroma.  This  was all done with the  cautery and I basically got 99% of the old cavity out, but here was a little  bit of it right up in the axilla and I was concerned that if I tried to get  that out I might injure the axillary vessels, so we left a little bit behind  and just cauterized the superficial portion of that.  That area measured  about 2 sq. cm.   I made sure everything was dry, irrigated and then put a 19 Blake drain in.  I closed the incision was some 2-0 Vicryl.  Medially and laterally I was  able to tack it down to the chest wall but in the very middle incision it  did not tack very well, so I just closed it and then closed with staples.  Inspecting this, there still was a bit more of a dog ear laterally than I  liked, so I extended the incision a little bit further laterally, took some  more of the skin and subcutaneous tissues and then reclosed that with 2-0  Vicryl.  I used some Marcaine on both sides to help with postop analgesia.  The patient tolerated the procedure well.  There were no operative   complications.  All counts were correct.      Currie Paris, M.D.  Electronically Signed     CJS/MEDQ  D:  05/30/2005  T:  05/30/2005  Job:  [id number]

## 2011-02-02 ENCOUNTER — Encounter (HOSPITAL_BASED_OUTPATIENT_CLINIC_OR_DEPARTMENT_OTHER): Payer: Medicare Other | Admitting: Oncology

## 2011-02-02 ENCOUNTER — Other Ambulatory Visit: Payer: Self-pay | Admitting: Oncology

## 2011-02-02 DIAGNOSIS — C50419 Malignant neoplasm of upper-outer quadrant of unspecified female breast: Secondary | ICD-10-CM

## 2011-02-02 DIAGNOSIS — Z5111 Encounter for antineoplastic chemotherapy: Secondary | ICD-10-CM

## 2011-02-02 DIAGNOSIS — Z7901 Long term (current) use of anticoagulants: Secondary | ICD-10-CM

## 2011-02-02 DIAGNOSIS — C50919 Malignant neoplasm of unspecified site of unspecified female breast: Secondary | ICD-10-CM

## 2011-02-02 LAB — CBC WITH DIFFERENTIAL/PLATELET
Eosinophils Absolute: 0.2 10*3/uL (ref 0.0–0.5)
HCT: 31.3 % — ABNORMAL LOW (ref 34.8–46.6)
HGB: 10.9 g/dL — ABNORMAL LOW (ref 11.6–15.9)
LYMPH%: 38.2 % (ref 14.0–49.7)
MONO#: 0.7 10*3/uL (ref 0.1–0.9)
NEUT#: 4.7 10*3/uL (ref 1.5–6.5)
NEUT%: 51.3 % (ref 38.4–76.8)
Platelets: 250 10*3/uL (ref 145–400)
WBC: 9.1 10*3/uL (ref 3.9–10.3)
lymph#: 3.5 10*3/uL — ABNORMAL HIGH (ref 0.9–3.3)

## 2011-02-02 LAB — COMPREHENSIVE METABOLIC PANEL
AST: 14 U/L (ref 0–37)
Alkaline Phosphatase: 86 U/L (ref 39–117)
BUN: 20 mg/dL (ref 6–23)
Creatinine, Ser: 0.97 mg/dL (ref 0.50–1.10)
Total Bilirubin: 0.4 mg/dL (ref 0.3–1.2)

## 2011-02-02 LAB — PROTIME-INR

## 2011-02-20 ENCOUNTER — Ambulatory Visit (HOSPITAL_COMMUNITY)
Admission: RE | Admit: 2011-02-20 | Discharge: 2011-02-20 | Disposition: A | Discharge status: Home / Self Care | Payer: Medicare Other | Source: Ambulatory Visit | Attending: Oncology | Admitting: Oncology

## 2011-02-20 DIAGNOSIS — I10 Essential (primary) hypertension: Secondary | ICD-10-CM | POA: Insufficient documentation

## 2011-02-20 DIAGNOSIS — Z09 Encounter for follow-up examination after completed treatment for conditions other than malignant neoplasm: Secondary | ICD-10-CM

## 2011-02-20 DIAGNOSIS — E785 Hyperlipidemia, unspecified: Secondary | ICD-10-CM | POA: Insufficient documentation

## 2011-02-20 DIAGNOSIS — I079 Rheumatic tricuspid valve disease, unspecified: Secondary | ICD-10-CM | POA: Insufficient documentation

## 2011-02-20 DIAGNOSIS — C50919 Malignant neoplasm of unspecified site of unspecified female breast: Secondary | ICD-10-CM | POA: Insufficient documentation

## 2011-02-23 ENCOUNTER — Encounter (HOSPITAL_BASED_OUTPATIENT_CLINIC_OR_DEPARTMENT_OTHER): Payer: Medicare Other | Admitting: Oncology

## 2011-02-23 ENCOUNTER — Other Ambulatory Visit: Payer: Self-pay | Admitting: Oncology

## 2011-02-23 DIAGNOSIS — Z5111 Encounter for antineoplastic chemotherapy: Secondary | ICD-10-CM

## 2011-02-23 DIAGNOSIS — C50919 Malignant neoplasm of unspecified site of unspecified female breast: Secondary | ICD-10-CM

## 2011-02-23 DIAGNOSIS — C50419 Malignant neoplasm of upper-outer quadrant of unspecified female breast: Secondary | ICD-10-CM

## 2011-02-23 DIAGNOSIS — Z7901 Long term (current) use of anticoagulants: Secondary | ICD-10-CM

## 2011-02-23 LAB — PROTIME-INR: INR: 2 (ref 2.00–3.50)

## 2011-03-08 ENCOUNTER — Encounter (HOSPITAL_COMMUNITY): Payer: Self-pay

## 2011-03-08 ENCOUNTER — Ambulatory Visit (HOSPITAL_COMMUNITY)
Admission: RE | Admit: 2011-03-08 | Discharge: 2011-03-08 | Disposition: A | Discharge status: Home / Self Care | Payer: Medicare Other | Source: Ambulatory Visit | Attending: Oncology | Admitting: Oncology

## 2011-03-08 ENCOUNTER — Other Ambulatory Visit: Payer: Self-pay | Admitting: Oncology

## 2011-03-08 ENCOUNTER — Other Ambulatory Visit (HOSPITAL_COMMUNITY): Payer: Medicare Other

## 2011-03-08 DIAGNOSIS — K3189 Other diseases of stomach and duodenum: Secondary | ICD-10-CM | POA: Insufficient documentation

## 2011-03-08 DIAGNOSIS — C50919 Malignant neoplasm of unspecified site of unspecified female breast: Secondary | ICD-10-CM

## 2011-03-08 DIAGNOSIS — R42 Dizziness and giddiness: Secondary | ICD-10-CM | POA: Insufficient documentation

## 2011-03-08 DIAGNOSIS — Z79899 Other long term (current) drug therapy: Secondary | ICD-10-CM | POA: Insufficient documentation

## 2011-03-08 DIAGNOSIS — Z9010 Acquired absence of unspecified breast and nipple: Secondary | ICD-10-CM | POA: Insufficient documentation

## 2011-03-08 DIAGNOSIS — Z9089 Acquired absence of other organs: Secondary | ICD-10-CM | POA: Insufficient documentation

## 2011-03-08 MED ORDER — IOHEXOL 300 MG/ML  SOLN
125.0000 mL | Freq: Once | INTRAMUSCULAR | Status: AC | PRN
  Administered 2011-03-08: 125 mL via INTRAVENOUS

## 2011-03-16 ENCOUNTER — Encounter (HOSPITAL_BASED_OUTPATIENT_CLINIC_OR_DEPARTMENT_OTHER): Payer: Medicare Other | Admitting: Oncology

## 2011-03-16 ENCOUNTER — Other Ambulatory Visit: Payer: Self-pay | Admitting: Physician Assistant

## 2011-03-16 DIAGNOSIS — Z5112 Encounter for antineoplastic immunotherapy: Secondary | ICD-10-CM

## 2011-03-16 DIAGNOSIS — C787 Secondary malignant neoplasm of liver and intrahepatic bile duct: Secondary | ICD-10-CM

## 2011-03-16 DIAGNOSIS — Z7901 Long term (current) use of anticoagulants: Secondary | ICD-10-CM

## 2011-03-16 DIAGNOSIS — C50419 Malignant neoplasm of upper-outer quadrant of unspecified female breast: Secondary | ICD-10-CM

## 2011-03-16 LAB — CBC WITH DIFFERENTIAL/PLATELET
Basophils Absolute: 0 10*3/uL (ref 0.0–0.1)
EOS%: 2.4 % (ref 0.0–7.0)
HCT: 32.4 % — ABNORMAL LOW (ref 34.8–46.6)
HGB: 11.3 g/dL — ABNORMAL LOW (ref 11.6–15.9)
MCH: 26.5 pg (ref 25.1–34.0)
MCV: 75.9 fL — ABNORMAL LOW (ref 79.5–101.0)
MONO%: 5.4 % (ref 0.0–14.0)
NEUT%: 63.1 % (ref 38.4–76.8)
RDW: 16.4 % — ABNORMAL HIGH (ref 11.2–14.5)

## 2011-03-16 LAB — CANCER ANTIGEN 27.29: CA 27.29: 30 U/mL (ref 0–39)

## 2011-03-16 LAB — COMPREHENSIVE METABOLIC PANEL
Albumin: 3.7 g/dL (ref 3.5–5.2)
BUN: 18 mg/dL (ref 6–23)
CO2: 24 mEq/L (ref 19–32)
Calcium: 8.9 mg/dL (ref 8.4–10.5)
Chloride: 103 mEq/L (ref 96–112)
Glucose, Bld: 93 mg/dL (ref 70–99)
Potassium: 4.1 mEq/L (ref 3.5–5.3)

## 2011-04-06 ENCOUNTER — Other Ambulatory Visit: Payer: Self-pay | Admitting: Oncology

## 2011-04-06 ENCOUNTER — Encounter (HOSPITAL_BASED_OUTPATIENT_CLINIC_OR_DEPARTMENT_OTHER): Payer: Medicare Other | Admitting: Oncology

## 2011-04-06 DIAGNOSIS — C50419 Malignant neoplasm of upper-outer quadrant of unspecified female breast: Secondary | ICD-10-CM

## 2011-04-06 DIAGNOSIS — Z5112 Encounter for antineoplastic immunotherapy: Secondary | ICD-10-CM

## 2011-04-06 LAB — PROTIME-INR: Protime: 26.4 Seconds — ABNORMAL HIGH (ref 10.6–13.4)

## 2011-04-27 ENCOUNTER — Other Ambulatory Visit: Payer: Self-pay | Admitting: Oncology

## 2011-04-27 ENCOUNTER — Encounter (HOSPITAL_BASED_OUTPATIENT_CLINIC_OR_DEPARTMENT_OTHER): Payer: Medicare Other | Admitting: Oncology

## 2011-04-27 DIAGNOSIS — Z5112 Encounter for antineoplastic immunotherapy: Secondary | ICD-10-CM

## 2011-04-27 DIAGNOSIS — C50419 Malignant neoplasm of upper-outer quadrant of unspecified female breast: Secondary | ICD-10-CM

## 2011-04-27 DIAGNOSIS — Z7901 Long term (current) use of anticoagulants: Secondary | ICD-10-CM

## 2011-04-27 LAB — PROTIME-INR
INR: 1.3 — ABNORMAL LOW (ref 2.00–3.50)
Protime: 15.6 Seconds — ABNORMAL HIGH (ref 10.6–13.4)

## 2011-04-27 LAB — CBC WITH DIFFERENTIAL/PLATELET
Basophils Absolute: 0 10*3/uL (ref 0.0–0.1)
EOS%: 1.5 % (ref 0.0–7.0)
Eosinophils Absolute: 0.1 10*3/uL (ref 0.0–0.5)
HGB: 12.1 g/dL (ref 11.6–15.9)
NEUT#: 5.7 10*3/uL (ref 1.5–6.5)
RBC: 4.52 10*6/uL (ref 3.70–5.45)
RDW: 17 % — ABNORMAL HIGH (ref 11.2–14.5)
lymph#: 2.3 10*3/uL (ref 0.9–3.3)
nRBC: 0 % (ref 0–0)

## 2011-04-27 LAB — COMPREHENSIVE METABOLIC PANEL
ALT: 9 U/L (ref 0–35)
Albumin: 4.1 g/dL (ref 3.5–5.2)
Alkaline Phosphatase: 97 U/L (ref 39–117)
Glucose, Bld: 101 mg/dL — ABNORMAL HIGH (ref 70–99)
Potassium: 3.8 mEq/L (ref 3.5–5.3)
Sodium: 141 mEq/L (ref 135–145)
Total Bilirubin: 0.5 mg/dL (ref 0.3–1.2)
Total Protein: 7.7 g/dL (ref 6.0–8.3)

## 2011-04-27 LAB — CANCER ANTIGEN 27.29: CA 27.29: 28 U/mL (ref 0–39)

## 2011-04-27 LAB — LACTATE DEHYDROGENASE: LDH: 163 U/L (ref 94–250)

## 2011-05-18 ENCOUNTER — Other Ambulatory Visit: Payer: Self-pay | Admitting: Oncology

## 2011-05-18 ENCOUNTER — Encounter (HOSPITAL_BASED_OUTPATIENT_CLINIC_OR_DEPARTMENT_OTHER): Payer: Medicare Other | Admitting: Oncology

## 2011-05-18 DIAGNOSIS — Z7901 Long term (current) use of anticoagulants: Secondary | ICD-10-CM

## 2011-05-18 DIAGNOSIS — Z5112 Encounter for antineoplastic immunotherapy: Secondary | ICD-10-CM

## 2011-05-18 DIAGNOSIS — C50419 Malignant neoplasm of upper-outer quadrant of unspecified female breast: Secondary | ICD-10-CM

## 2011-05-18 DIAGNOSIS — Z5181 Encounter for therapeutic drug level monitoring: Secondary | ICD-10-CM

## 2011-05-18 DIAGNOSIS — I10 Essential (primary) hypertension: Secondary | ICD-10-CM

## 2011-05-18 LAB — CBC WITH DIFFERENTIAL/PLATELET
BASO%: 0.4 % (ref 0.0–2.0)
Basophils Absolute: 0 10*3/uL (ref 0.0–0.1)
EOS%: 3.2 % (ref 0.0–7.0)
HCT: 34.3 % — ABNORMAL LOW (ref 34.8–46.6)
HGB: 12.1 g/dL (ref 11.6–15.9)
LYMPH%: 44.1 % (ref 14.0–49.7)
MCH: 26.9 pg (ref 25.1–34.0)
MCHC: 35.3 g/dL (ref 31.5–36.0)
NEUT%: 46.1 % (ref 38.4–76.8)
Platelets: 259 10*3/uL (ref 145–400)
lymph#: 4.3 10*3/uL — ABNORMAL HIGH (ref 0.9–3.3)

## 2011-05-18 LAB — COMPREHENSIVE METABOLIC PANEL
ALT: 10 U/L (ref 0–35)
Albumin: 3.2 g/dL — ABNORMAL LOW (ref 3.5–5.2)
CO2: 26 mEq/L (ref 19–32)
Calcium: 9 mg/dL (ref 8.4–10.5)
Chloride: 100 mEq/L (ref 96–112)
Glucose, Bld: 77 mg/dL (ref 70–99)
Potassium: 3.2 mEq/L — ABNORMAL LOW (ref 3.5–5.3)
Sodium: 136 mEq/L (ref 135–145)
Total Bilirubin: 0.2 mg/dL — ABNORMAL LOW (ref 0.3–1.2)
Total Protein: 7.7 g/dL (ref 6.0–8.3)

## 2011-05-18 LAB — APTT: aPTT: 31

## 2011-05-18 LAB — PROTIME-INR
INR: 1.6 — ABNORMAL HIGH
Prothrombin Time: 19.1 — ABNORMAL HIGH

## 2011-05-18 LAB — LACTATE DEHYDROGENASE: LDH: 195 U/L (ref 94–250)

## 2011-05-19 LAB — IRON AND TIBC: Iron: 47 ug/dL (ref 42–145)

## 2011-05-19 LAB — CANCER ANTIGEN 27.29: CA 27.29: 21 U/mL (ref 0–39)

## 2011-05-19 LAB — FERRITIN: Ferritin: 34 ng/mL (ref 10–291)

## 2011-05-22 LAB — COMPREHENSIVE METABOLIC PANEL
ALT: 22
AST: 28
Calcium: 9
GFR calc Af Amer: 52 — ABNORMAL LOW
Sodium: 134 — ABNORMAL LOW
Total Protein: 7.9

## 2011-05-22 LAB — URINE MICROSCOPIC-ADD ON

## 2011-05-22 LAB — URINALYSIS, ROUTINE W REFLEX MICROSCOPIC
Glucose, UA: NEGATIVE
Leukocytes, UA: NEGATIVE
Protein, ur: 30 — AB
Specific Gravity, Urine: 1.018
pH: 6

## 2011-05-22 LAB — DIFFERENTIAL
Basophils Absolute: 0.1
Basophils Relative: 1
Monocytes Absolute: 0.6

## 2011-05-22 LAB — LIPASE, BLOOD: Lipase: 19

## 2011-05-22 LAB — CBC
MCHC: 34.1
RDW: 17.7 — ABNORMAL HIGH

## 2011-05-28 LAB — CARDIAC PANEL(CRET KIN+CKTOT+MB+TROPI)
CK, MB: 1
Total CK: 77
Troponin I: 0.13 — ABNORMAL HIGH

## 2011-05-28 LAB — BASIC METABOLIC PANEL
BUN: 10
BUN: 11
BUN: 5 — ABNORMAL LOW
Calcium: 9
Chloride: 107
Creatinine, Ser: 1.32 — ABNORMAL HIGH
GFR calc Af Amer: >60
GFR calc Af Amer: >60
GFR calc non Af Amer: 42 — ABNORMAL LOW
GFR calc non Af Amer: >60
GFR calc non Af Amer: >60
Glucose, Bld: 86
Potassium: 3.3 — ABNORMAL LOW
Potassium: 3.7
Potassium: 3.9
Sodium: 134 — ABNORMAL LOW
Sodium: 136
Sodium: 137

## 2011-05-28 LAB — URINE MICROSCOPIC-ADD ON

## 2011-05-28 LAB — CBC
HCT: 30.1 — ABNORMAL LOW
HCT: 34.3 — ABNORMAL LOW
HCT: 36
Hemoglobin: 10.1 — ABNORMAL LOW
Hemoglobin: 10.8 — ABNORMAL LOW
Hemoglobin: 11.9 — ABNORMAL LOW
MCHC: 33.2
MCV: 80.3
Platelets: 174
Platelets: 214
Platelets: 214
RBC: 4.04
RBC: 4.49
RDW: 17.8 — ABNORMAL HIGH
WBC: 10.3
WBC: 6.9

## 2011-05-28 LAB — COMPREHENSIVE METABOLIC PANEL
ALT: 12
AST: 17
Alkaline Phosphatase: 76
Calcium: 9.1
GFR calc Af Amer: >60
Potassium: 3.6
Sodium: 133 — ABNORMAL LOW
Total Protein: 6.8

## 2011-05-28 LAB — URINALYSIS, ROUTINE W REFLEX MICROSCOPIC
Bilirubin Urine: NEGATIVE
Glucose, UA: NEGATIVE
Ketones, ur: NEGATIVE
pH: 7

## 2011-05-28 LAB — LIPID PANEL
LDL Cholesterol: 110 — ABNORMAL HIGH
Total CHOL/HDL Ratio: 4.6
VLDL: 17

## 2011-05-28 LAB — DIFFERENTIAL
Basophils Absolute: 0
Lymphocytes Relative: 24
Monocytes Absolute: 0.8
Monocytes Relative: 10
Neutro Abs: 5.1

## 2011-05-28 LAB — URINE CULTURE
Colony Count: NO GROWTH
Special Requests: NEGATIVE

## 2011-05-28 LAB — APTT: aPTT: 42 — ABNORMAL HIGH

## 2011-05-28 LAB — T4, FREE: Free T4: 1.27

## 2011-05-28 LAB — PROTIME-INR
INR: 1.3
Prothrombin Time: 16.7 — ABNORMAL HIGH

## 2011-06-01 ENCOUNTER — Other Ambulatory Visit: Payer: Self-pay | Admitting: Physician Assistant

## 2011-06-01 ENCOUNTER — Encounter (HOSPITAL_BASED_OUTPATIENT_CLINIC_OR_DEPARTMENT_OTHER): Payer: Medicare Other | Admitting: Oncology

## 2011-06-01 DIAGNOSIS — Z5112 Encounter for antineoplastic immunotherapy: Secondary | ICD-10-CM

## 2011-06-01 DIAGNOSIS — Z7901 Long term (current) use of anticoagulants: Secondary | ICD-10-CM

## 2011-06-01 DIAGNOSIS — C50419 Malignant neoplasm of upper-outer quadrant of unspecified female breast: Secondary | ICD-10-CM

## 2011-06-01 DIAGNOSIS — Z5111 Encounter for antineoplastic chemotherapy: Secondary | ICD-10-CM

## 2011-06-01 LAB — COMPREHENSIVE METABOLIC PANEL
CO2: 25 mEq/L (ref 19–32)
Creatinine, Ser: 0.86 mg/dL (ref 0.50–1.10)
Glucose, Bld: 103 mg/dL — ABNORMAL HIGH (ref 70–99)
Total Bilirubin: 0.6 mg/dL (ref 0.3–1.2)

## 2011-06-01 LAB — CBC WITH DIFFERENTIAL/PLATELET
EOS%: 2.6 % (ref 0.0–7.0)
HCT: 36.3 % (ref 34.8–46.6)
HGB: 12.7 g/dL (ref 11.6–15.9)
LYMPH%: 27.1 % (ref 14.0–49.7)
MCHC: 35 g/dL (ref 31.5–36.0)
MONO#: 0.6 10*3/uL (ref 0.1–0.9)
MONO%: 8.1 % (ref 0.0–14.0)
RDW: 16.2 % — ABNORMAL HIGH (ref 11.2–14.5)
WBC: 7.3 10*3/uL (ref 3.9–10.3)
lymph#: 2 10*3/uL (ref 0.9–3.3)
nRBC: 0 % (ref 0–0)

## 2011-06-13 ENCOUNTER — Encounter: Payer: Self-pay | Admitting: *Deleted

## 2011-06-14 ENCOUNTER — Encounter: Payer: Self-pay | Admitting: Oncology

## 2011-06-22 ENCOUNTER — Other Ambulatory Visit: Payer: Self-pay | Admitting: Oncology

## 2011-06-22 ENCOUNTER — Encounter (HOSPITAL_BASED_OUTPATIENT_CLINIC_OR_DEPARTMENT_OTHER): Payer: Medicare Other | Admitting: Oncology

## 2011-06-22 ENCOUNTER — Other Ambulatory Visit: Payer: Self-pay | Admitting: Physician Assistant

## 2011-06-22 DIAGNOSIS — Z5111 Encounter for antineoplastic chemotherapy: Secondary | ICD-10-CM

## 2011-06-22 DIAGNOSIS — Z7901 Long term (current) use of anticoagulants: Secondary | ICD-10-CM

## 2011-06-22 DIAGNOSIS — C50919 Malignant neoplasm of unspecified site of unspecified female breast: Secondary | ICD-10-CM

## 2011-06-22 DIAGNOSIS — Z5112 Encounter for antineoplastic immunotherapy: Secondary | ICD-10-CM

## 2011-06-22 DIAGNOSIS — C50419 Malignant neoplasm of upper-outer quadrant of unspecified female breast: Secondary | ICD-10-CM

## 2011-06-22 LAB — CBC WITH DIFFERENTIAL/PLATELET
Basophils Absolute: 0.1 10*3/uL (ref 0.0–0.1)
Eosinophils Absolute: 0.2 10*3/uL (ref 0.0–0.5)
HCT: 32.8 % — ABNORMAL LOW (ref 34.8–46.6)
HGB: 10.9 g/dL — ABNORMAL LOW (ref 11.6–15.9)
MONO#: 0.5 10*3/uL (ref 0.1–0.9)
NEUT%: 67.4 % (ref 38.4–76.8)
WBC: 8.6 10*3/uL (ref 3.9–10.3)
lymph#: 2 10*3/uL (ref 0.9–3.3)

## 2011-06-22 LAB — COMPREHENSIVE METABOLIC PANEL
Alkaline Phosphatase: 82 U/L (ref 39–117)
BUN: 20 mg/dL (ref 6–23)
Glucose, Bld: 95 mg/dL (ref 70–99)
Sodium: 142 mEq/L (ref 135–145)
Total Bilirubin: 0.5 mg/dL (ref 0.3–1.2)

## 2011-06-22 LAB — PROTIME-INR

## 2011-06-28 ENCOUNTER — Ambulatory Visit (HOSPITAL_COMMUNITY)
Admission: RE | Admit: 2011-06-28 | Discharge: 2011-06-28 | Disposition: A | Discharge status: Home / Self Care | Payer: Medicare Other | Source: Ambulatory Visit | Attending: Oncology | Admitting: Oncology

## 2011-06-28 ENCOUNTER — Other Ambulatory Visit: Payer: Self-pay | Admitting: Oncology

## 2011-06-28 ENCOUNTER — Other Ambulatory Visit (HOSPITAL_COMMUNITY): Payer: Medicare Other

## 2011-06-28 ENCOUNTER — Inpatient Hospital Stay (HOSPITAL_COMMUNITY): Admission: RE | Admit: 2011-06-28 | Payer: Medicare Other | Source: Ambulatory Visit

## 2011-06-28 DIAGNOSIS — C50919 Malignant neoplasm of unspecified site of unspecified female breast: Secondary | ICD-10-CM

## 2011-06-28 DIAGNOSIS — T82598A Other mechanical complication of other cardiac and vascular devices and implants, initial encounter: Secondary | ICD-10-CM | POA: Insufficient documentation

## 2011-06-28 DIAGNOSIS — Y849 Medical procedure, unspecified as the cause of abnormal reaction of the patient, or of later complication, without mention of misadventure at the time of the procedure: Secondary | ICD-10-CM | POA: Insufficient documentation

## 2011-06-28 LAB — CBC
HCT: 30.2 % — ABNORMAL LOW (ref 36.0–46.0)
Hemoglobin: 10.7 g/dL — ABNORMAL LOW (ref 12.0–15.0)
MCH: 27 pg (ref 26.0–34.0)
MCV: 76.1 fL — ABNORMAL LOW (ref 78.0–100.0)
RBC: 3.97 MIL/uL (ref 3.87–5.11)

## 2011-07-02 ENCOUNTER — Other Ambulatory Visit: Payer: Self-pay | Admitting: Oncology

## 2011-07-02 MED ORDER — OXYCODONE HCL 60 MG PO TB12: 60.0000 mg | ORAL_TABLET | Freq: Two times a day (BID) | ORAL | Status: DC

## 2011-07-03 ENCOUNTER — Encounter: Payer: Self-pay | Admitting: Oncology

## 2011-07-03 NOTE — Telephone Encounter (Signed)
Discontinued OxyContin needs to be placed under medications/activity

## 2011-07-04 ENCOUNTER — Telehealth: Payer: Self-pay | Admitting: Oncology

## 2011-07-04 NOTE — Telephone Encounter (Signed)
Pt called questioning if she could receive her Herceptin peripherally for her next treatment.  RN advised pt that she could receive next treatment peripherally per Sharyl Nimrod PA

## 2011-07-04 NOTE — Progress Notes (Signed)
This encounter was created in error - please disregard.

## 2011-07-12 ENCOUNTER — Telehealth: Payer: Self-pay | Admitting: *Deleted

## 2011-07-12 ENCOUNTER — Other Ambulatory Visit: Payer: Self-pay | Admitting: Oncology

## 2011-07-12 DIAGNOSIS — C50919 Malignant neoplasm of unspecified site of unspecified female breast: Secondary | ICD-10-CM | POA: Insufficient documentation

## 2011-07-12 NOTE — Telephone Encounter (Signed)
[name] with IR called to advise that Dr Garlon Hatchet can assess pt on Nov 27th with possible placement of passport.  Pt is to be NPO after MN 07-23-11 and bring a driver with her for the procedure.  Pt is to stop coumadin Nov 22nd.  Last dose 07/18/11.  Pt notified and verbalized understanding.

## 2011-07-12 NOTE — Telephone Encounter (Signed)
Spoke with [name] in Interventional Radiology regarding the possibility of obtaining a passport for Ms Youngberg.  The MD will review her case and advise accordingly.

## 2011-07-12 NOTE — Telephone Encounter (Signed)
Keflex 500mg  tid x 10days with no refills called to [facility] [place] [phone] per Sharyl Nimrod PA

## 2011-07-13 ENCOUNTER — Other Ambulatory Visit: Payer: Self-pay

## 2011-07-13 ENCOUNTER — Other Ambulatory Visit (HOSPITAL_BASED_OUTPATIENT_CLINIC_OR_DEPARTMENT_OTHER): Payer: Medicare Other | Admitting: Lab

## 2011-07-13 ENCOUNTER — Other Ambulatory Visit: Payer: Self-pay | Admitting: Physician Assistant

## 2011-07-13 ENCOUNTER — Telehealth: Payer: Self-pay | Admitting: *Deleted

## 2011-07-13 ENCOUNTER — Ambulatory Visit (HOSPITAL_BASED_OUTPATIENT_CLINIC_OR_DEPARTMENT_OTHER): Payer: Medicare Other

## 2011-07-13 ENCOUNTER — Other Ambulatory Visit: Payer: Self-pay | Admitting: Oncology

## 2011-07-13 VITALS — BP 126/85 | HR 81 | Temp 98.7°F

## 2011-07-13 DIAGNOSIS — C50919 Malignant neoplasm of unspecified site of unspecified female breast: Secondary | ICD-10-CM

## 2011-07-13 DIAGNOSIS — Z5112 Encounter for antineoplastic immunotherapy: Secondary | ICD-10-CM

## 2011-07-13 DIAGNOSIS — Z7901 Long term (current) use of anticoagulants: Secondary | ICD-10-CM

## 2011-07-13 DIAGNOSIS — C50419 Malignant neoplasm of upper-outer quadrant of unspecified female breast: Secondary | ICD-10-CM

## 2011-07-13 LAB — CBC WITH DIFFERENTIAL/PLATELET
BASO%: 0.4 % (ref 0.0–2.0)
Eosinophils Absolute: 0.1 10*3/uL (ref 0.0–0.5)
HCT: 30 % — ABNORMAL LOW (ref 34.8–46.6)
HGB: 10.5 g/dL — ABNORMAL LOW (ref 11.6–15.9)
LYMPH%: 26.3 % (ref 14.0–49.7)
MCHC: 35 g/dL (ref 31.5–36.0)
MONO#: 0.7 10*3/uL (ref 0.1–0.9)
NEUT#: 6.9 10*3/uL — ABNORMAL HIGH (ref 1.5–6.5)
NEUT%: 65.7 % (ref 38.4–76.8)
Platelets: 419 10*3/uL — ABNORMAL HIGH (ref 145–400)
WBC: 10.5 10*3/uL — ABNORMAL HIGH (ref 3.9–10.3)
lymph#: 2.8 10*3/uL (ref 0.9–3.3)
nRBC: 0 % (ref 0–0)

## 2011-07-13 LAB — PROTIME-INR

## 2011-07-13 MED ORDER — ACETAMINOPHEN 325 MG PO TABS
650.0000 mg | ORAL_TABLET | Freq: Once | ORAL | Status: AC
  Administered 2011-07-13: 650 mg via ORAL

## 2011-07-13 MED ORDER — HYDROCODONE-ACETAMINOPHEN 5-500 MG PO TABS: 1.0000 | ORAL_TABLET | Freq: Four times a day (QID) | ORAL | Status: AC | PRN

## 2011-07-13 MED ORDER — DIPHENHYDRAMINE HCL 25 MG PO CAPS
50.0000 mg | ORAL_CAPSULE | Freq: Once | ORAL | Status: AC
  Administered 2011-07-13: 50 mg via ORAL

## 2011-07-13 MED ORDER — SODIUM CHLORIDE 0.9 % IV SOLN
Freq: Once | INTRAVENOUS | Status: AC
  Administered 2011-07-13: 09:00:00 via INTRAVENOUS

## 2011-07-13 MED ORDER — SODIUM CHLORIDE 0.9 % IJ SOLN
10.0000 mL | INTRAMUSCULAR | Status: DC | PRN
  Filled 2011-07-13: qty 10

## 2011-07-13 MED ORDER — TRASTUZUMAB CHEMO INJECTION 440 MG
6.0000 mg/kg | Freq: Once | INTRAVENOUS | Status: AC
  Administered 2011-07-13: 735 mg via INTRAVENOUS
  Filled 2011-07-13: qty 35

## 2011-07-13 NOTE — Patient Instructions (Signed)
[facility] Discharge Instructions for Patients Receiving Chemotherapy  Today you received the following chemotherapy agent--Herceptin  To help prevent nausea and vomiting after your treatment, we encourage you to take your nausea medication as ordered    If you develop nausea and vomiting that is not controlled by your nausea medication, call the clinic. If it is after clinic hours your family physician or the after hours number for the clinic or go to the Emergency Department.  Follow up as scheduled for repeat PT/INR and ECHO. Take Vicodin 5/500 #1-2 tabs every 6-8 hours as needed for pain at wound site.  BELOW ARE SYMPTOMS THAT SHOULD BE REPORTED IMMEDIATELY:  *FEVER GREATER THAN 100.5 F  *CHILLS WITH OR WITHOUT FEVER  NAUSEA AND VOMITING THAT IS NOT CONTROLLED WITH YOUR NAUSEA MEDICATION  *UNUSUAL SHORTNESS OF BREATH  *UNUSUAL BRUISING OR BLEEDING  TENDERNESS IN MOUTH AND THROAT WITH OR WITHOUT PRESENCE OF ULCERS  *URINARY PROBLEMS  *BOWEL PROBLEMS  UNUSUAL RASH Items with * indicate a potential emergency and should be followed up as soon as possible.   Feel free to call the clinic you have any questions or concerns. The clinic phone number is [phone].   I have been informed and understand all the instructions given to me. I know to contact the clinic, my physician, or go to the Emergency Department if any problems should occur. I do not have any questions at this time, but understand that I may call the clinic during office hours   should I have any questions or need assistance in obtaining follow up care.    __________________________________________  _____________  __________ Signature of Patient or Authorized Representative            Date                   Time    __________________________________________ Nurse's Signature

## 2011-07-13 NOTE — Telephone Encounter (Signed)
GAVE PATIENT APPOINTMENT ECHO ON 07-17-2011 11:00AM

## 2011-07-13 NOTE — Progress Notes (Signed)
Addended by: Wandalee Ferdinand on: 07/13/2011 04:26 PM   Modules accepted: Orders

## 2011-07-16 ENCOUNTER — Other Ambulatory Visit: Payer: Self-pay | Admitting: Physician Assistant

## 2011-07-16 DIAGNOSIS — C50919 Malignant neoplasm of unspecified site of unspecified female breast: Secondary | ICD-10-CM

## 2011-07-16 LAB — COMPREHENSIVE METABOLIC PANEL
ALT: 11 U/L (ref 0–35)
CO2: 25 mEq/L (ref 19–32)
Calcium: 9.3 mg/dL (ref 8.4–10.5)
Chloride: 103 mEq/L (ref 96–112)
Creatinine, Ser: 0.98 mg/dL (ref 0.50–1.10)
Glucose, Bld: 98 mg/dL (ref 70–99)
Total Protein: 8.3 g/dL (ref 6.0–8.3)

## 2011-07-17 ENCOUNTER — Other Ambulatory Visit: Payer: Self-pay | Admitting: Physician Assistant

## 2011-07-17 ENCOUNTER — Ambulatory Visit (HOSPITAL_COMMUNITY)
Admission: RE | Admit: 2011-07-17 | Discharge: 2011-07-17 | Disposition: A | Discharge status: Home / Self Care | Payer: Medicare Other | Source: Ambulatory Visit | Attending: Internal Medicine | Admitting: Internal Medicine

## 2011-07-17 ENCOUNTER — Encounter (HOSPITAL_COMMUNITY): Payer: Self-pay | Admitting: Pharmacy Technician

## 2011-07-17 DIAGNOSIS — I059 Rheumatic mitral valve disease, unspecified: Secondary | ICD-10-CM

## 2011-07-17 DIAGNOSIS — C50919 Malignant neoplasm of unspecified site of unspecified female breast: Secondary | ICD-10-CM | POA: Insufficient documentation

## 2011-07-17 DIAGNOSIS — Z01818 Encounter for other preprocedural examination: Secondary | ICD-10-CM | POA: Insufficient documentation

## 2011-07-17 NOTE — Progress Notes (Signed)
  Echocardiogram 2D Echocardiogram has been performed.  Cathie Beams Deneen 07/17/2011, 12:16 PM

## 2011-07-23 ENCOUNTER — Other Ambulatory Visit: Payer: Self-pay | Admitting: Physician Assistant

## 2011-07-23 MED ORDER — VANCOMYCIN HCL 1000 MG IV SOLR: 1000.0000 mg | Freq: Once | INTRAVENOUS | Status: DC

## 2011-07-23 MED ORDER — SODIUM CHLORIDE 0.9 % IV SOLN: INTRAVENOUS | Status: DC

## 2011-07-24 ENCOUNTER — Telehealth: Payer: Self-pay | Admitting: *Deleted

## 2011-07-24 ENCOUNTER — Inpatient Hospital Stay (HOSPITAL_COMMUNITY): Admission: RE | Admit: 2011-07-24 | Payer: Medicare Other | Source: Ambulatory Visit

## 2011-07-24 ENCOUNTER — Ambulatory Visit (HOSPITAL_COMMUNITY)
Admission: RE | Admit: 2011-07-24 | Discharge: 2011-07-24 | Disposition: A | Discharge status: Home / Self Care | Payer: Medicare Other | Source: Ambulatory Visit | Attending: Physician Assistant | Admitting: Physician Assistant

## 2011-07-24 ENCOUNTER — Other Ambulatory Visit (HOSPITAL_BASED_OUTPATIENT_CLINIC_OR_DEPARTMENT_OTHER): Payer: Medicare Other | Admitting: Lab

## 2011-07-24 ENCOUNTER — Other Ambulatory Visit: Payer: Self-pay | Admitting: Physician Assistant

## 2011-07-24 DIAGNOSIS — C50919 Malignant neoplasm of unspecified site of unspecified female breast: Secondary | ICD-10-CM

## 2011-07-24 DIAGNOSIS — Z7901 Long term (current) use of anticoagulants: Secondary | ICD-10-CM

## 2011-07-24 LAB — PROTIME-INR
INR: 1.1 — ABNORMAL LOW (ref 2.00–3.50)
Protime: 13.2 Seconds (ref 10.6–13.4)

## 2011-07-24 MED ORDER — DIAZEPAM 5 MG PO TABS
10.0000 mg | ORAL_TABLET | Freq: Once | ORAL | Status: AC
  Administered 2011-07-24: 10 mg via ORAL
  Filled 2011-07-24: qty 2

## 2011-07-24 MED ORDER — HEPARIN SOD (PORK) LOCK FLUSH 100 UNIT/ML IV SOLN
500.0000 [IU] | Freq: Once | INTRAVENOUS | Status: AC
  Administered 2011-07-24: 500 [IU] via INTRAVENOUS

## 2011-07-24 NOTE — Telephone Encounter (Signed)
Returned call and advised that patient is taking Keflex 500mg  tid.  Pt also verbalized the same medication and dosage.

## 2011-07-24 NOTE — Procedures (Signed)
R IJ Tunnelled SL CVC to SVC/ RA junction No ptx. No complication No blood loss. See complete dictation in [facility].

## 2011-07-24 NOTE — Telephone Encounter (Signed)
placed patient on the flush nurse list for 07-25-2011

## 2011-07-25 ENCOUNTER — Telehealth: Payer: Self-pay | Admitting: *Deleted

## 2011-07-25 ENCOUNTER — Ambulatory Visit (HOSPITAL_BASED_OUTPATIENT_CLINIC_OR_DEPARTMENT_OTHER): Payer: Medicare Other

## 2011-07-25 DIAGNOSIS — Z452 Encounter for adjustment and management of vascular access device: Secondary | ICD-10-CM

## 2011-07-25 DIAGNOSIS — Z170 Estrogen receptor positive status [ER+]: Secondary | ICD-10-CM

## 2011-07-25 DIAGNOSIS — C50919 Malignant neoplasm of unspecified site of unspecified female breast: Secondary | ICD-10-CM

## 2011-07-25 MED ORDER — HEPARIN SOD (PORK) LOCK FLUSH 100 UNIT/ML IV SOLN
500.0000 [IU] | Freq: Once | INTRAVENOUS | Status: AC
  Administered 2011-07-25: 250 [IU] via INTRAVENOUS
  Filled 2011-07-25: qty 5

## 2011-07-25 MED ORDER — SODIUM CHLORIDE 0.9 % IJ SOLN
10.0000 mL | Freq: Once | INTRAMUSCULAR | Status: AC
  Administered 2011-07-25: 10 mL via INTRAVENOUS
  Filled 2011-07-25: qty 10

## 2011-07-25 NOTE — Patient Instructions (Signed)
Call MD for problems 

## 2011-07-25 NOTE — Telephone Encounter (Signed)
Refill for Keflex 500mg  tid x 7 days and Xanax 1mg  #90 with 1 [facility]ill [facility]

## 2011-07-26 ENCOUNTER — Telehealth: Payer: Self-pay | Admitting: *Deleted

## 2011-07-26 NOTE — Telephone Encounter (Signed)
RN called and left message for patient to call for PICC line care

## 2011-08-03 ENCOUNTER — Ambulatory Visit (HOSPITAL_BASED_OUTPATIENT_CLINIC_OR_DEPARTMENT_OTHER): Payer: Medicare Other | Admitting: Physician Assistant

## 2011-08-03 ENCOUNTER — Ambulatory Visit: Payer: Medicare Other

## 2011-08-03 ENCOUNTER — Other Ambulatory Visit: Payer: Medicare Other | Admitting: Lab

## 2011-08-03 ENCOUNTER — Ambulatory Visit (HOSPITAL_BASED_OUTPATIENT_CLINIC_OR_DEPARTMENT_OTHER): Payer: Medicare Other

## 2011-08-03 ENCOUNTER — Telehealth: Payer: Self-pay | Admitting: *Deleted

## 2011-08-03 VITALS — BP 148/100 | HR 83

## 2011-08-03 VITALS — BP 170/104 | HR 86 | Temp 98.6°F | Ht 63.0 in | Wt 261.4 lb

## 2011-08-03 DIAGNOSIS — Z8679 Personal history of other diseases of the circulatory system: Secondary | ICD-10-CM

## 2011-08-03 DIAGNOSIS — Z5112 Encounter for antineoplastic immunotherapy: Secondary | ICD-10-CM

## 2011-08-03 DIAGNOSIS — R52 Pain, unspecified: Secondary | ICD-10-CM

## 2011-08-03 DIAGNOSIS — C50419 Malignant neoplasm of upper-outer quadrant of unspecified female breast: Secondary | ICD-10-CM

## 2011-08-03 DIAGNOSIS — C50919 Malignant neoplasm of unspecified site of unspecified female breast: Secondary | ICD-10-CM

## 2011-08-03 DIAGNOSIS — Z7901 Long term (current) use of anticoagulants: Secondary | ICD-10-CM

## 2011-08-03 LAB — CBC WITH DIFFERENTIAL/PLATELET
Basophils Absolute: 0 10*3/uL (ref 0.0–0.1)
EOS%: 2 % (ref 0.0–7.0)
Eosinophils Absolute: 0.2 10*3/uL (ref 0.0–0.5)
HCT: 30.2 % — ABNORMAL LOW (ref 34.8–46.6)
HGB: 10 g/dL — ABNORMAL LOW (ref 11.6–15.9)
LYMPH%: 40.1 % (ref 14.0–49.7)
MCH: 26.8 pg (ref 25.1–34.0)
MCV: 80.9 fL (ref 79.5–101.0)
MONO%: 6.3 % (ref 0.0–14.0)
NEUT#: 4 10*3/uL (ref 1.5–6.5)
NEUT%: 51.1 % (ref 38.4–76.8)
Platelets: 275 10*3/uL (ref 145–400)
RDW: 17.7 % — ABNORMAL HIGH (ref 11.2–14.5)

## 2011-08-03 LAB — COMPREHENSIVE METABOLIC PANEL
AST: 10 U/L (ref 0–37)
Albumin: 3.4 g/dL — ABNORMAL LOW (ref 3.5–5.2)
BUN: 14 mg/dL (ref 6–23)
CO2: 25 mEq/L (ref 19–32)
Calcium: 9.1 mg/dL (ref 8.4–10.5)
Chloride: 107 mEq/L (ref 96–112)
Potassium: 3.8 mEq/L (ref 3.5–5.3)

## 2011-08-03 LAB — LACTATE DEHYDROGENASE: LDH: 144 U/L (ref 94–250)

## 2011-08-03 LAB — PROTHROMBIN TIME: INR: 1.18 (ref <1.50)

## 2011-08-03 MED ORDER — SODIUM CHLORIDE 0.9 % IV SOLN
Freq: Once | INTRAVENOUS | Status: AC
  Administered 2011-08-03: 10:00:00 via INTRAVENOUS

## 2011-08-03 MED ORDER — MORPHINE SULFATE 10 MG/ML IJ SOLN
2.0000 mg | INTRAMUSCULAR | Status: DC | PRN
  Administered 2011-08-03: 2 mg via INTRAVENOUS

## 2011-08-03 MED ORDER — ACETAMINOPHEN 325 MG PO TABS
650.0000 mg | ORAL_TABLET | Freq: Once | ORAL | Status: AC
  Administered 2011-08-03: 650 mg via ORAL

## 2011-08-03 MED ORDER — SODIUM CHLORIDE 0.9 % IJ SOLN
10.0000 mL | INTRAMUSCULAR | Status: DC | PRN
Start: 2011-08-03 — End: 2011-08-03
  Administered 2011-08-03: 10 mL
  Filled 2011-08-03: qty 10

## 2011-08-03 MED ORDER — DIPHENHYDRAMINE HCL 25 MG PO CAPS
50.0000 mg | ORAL_CAPSULE | Freq: Once | ORAL | Status: AC
  Administered 2011-08-03: 50 mg via ORAL

## 2011-08-03 MED ORDER — MORPHINE SULFATE CR 60 MG PO TB12: 60.0000 mg | ORAL_TABLET | Freq: Two times a day (BID) | ORAL | Status: DC

## 2011-08-03 MED ORDER — TRASTUZUMAB CHEMO INJECTION 440 MG
6.0000 mg/kg | Freq: Once | INTRAVENOUS | Status: AC
  Administered 2011-08-03: 735 mg via INTRAVENOUS
  Filled 2011-08-03: qty 35

## 2011-08-03 MED ORDER — HEPARIN SOD (PORK) LOCK FLUSH 100 UNIT/ML IV SOLN
250.0000 [IU] | Freq: Once | INTRAVENOUS | Status: AC | PRN
  Administered 2011-08-03: 250 [IU]
  Filled 2011-08-03: qty 5

## 2011-08-03 NOTE — Telephone Encounter (Signed)
gave patient appointment for 09-14-2011 printed out calendar and gave to the patient

## 2011-08-03 NOTE — Progress Notes (Signed)
CC:   Philemon Kingdom, MD  DIAGNOSES: 59. A 60 year old [place], [place], woman with a history of     metastatic HER-2 positive breast carcinoma originally diagnosed     September 2004, on maintenance q.3-week Herceptin. 2. 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  INR     from today pending. 3. History of chemotherapy-induced neuropathy.  Has been on Neurontin     600 mg p.o. t.i.d. in the past. 4. History of hypertension, managed by Dr. Sudie Bailey.  SUBJECTIVE:  [name] is seen today with her husband, [name] in accompaniment prior to her next q.3-week dose of Herceptin.  Of note, she underwent PICC line placement on her right anterior chest wall on 07/24/2011 via Interventional Radiology.  Her prior left anterior chest wall Power port site has been debrided.  It is healing by secondary intent and is healing quite well.  [name] admits that she has not refilled her OxyContin, Lunesta, or gabapentin due to the fact that she is "in the donut hole."  I will give her some IV morphine today while she is here.  Her pain primarily again is her knees and joints, but she also has had discomfort following her surgical procedure, though the area looks quite well.  She denies any shortness of breath.  No frank nausea, emesis, diarrhea, or constipation.  REVIEW OF SYSTEMS:  Review of systems is otherwise negative.  ALLERGIES:  Penicillin.  CURRENT MEDICATIONS:  Current medications reviewed with the patient.  ECOG status of 1.  OBJECTIVE PHYSICAL EXAMINATION:  Vital Signs:  Blood pressure is 170/104, pulse 86, respirations 20, temp 98.6, weight 261 pounds.  Full exam deferred.  Lungs:  Clear to auscultation.  No wheezing or rhonchi. Heart:  Regular rate and rhythm.  Right anterior PICC line site notes that the dressing is clean, dry.  There is no evidence of erythema.  LABORATORY DATA:  At the time of this dictation, CBC, PT, and INR are pending.  IMPRESSIONS: 73. A  60 year old [place], [place], woman with a history of     metastatic HER-2 positive breast carcinoma, for next q.3-week dose     of maintenance Herceptin. 2. History of superior vena caval syndrome on Coumadin, INR pending     from today. 3. Recent PICC line placement. Case reviewed with Dr. Welton Flakes in Dr. Renelda Loma absence.  PLAN:  [name] will receive treatment today as scheduled.  IV morphine 2 to 4 mg will be provided for pain control.  Given the fact that her insurance company will not cover OxyContin, I have prescribed MS Contin 60 mg p.o. b.i.d., which is part of their coverage.  Other options were Opana and Avinza.  She will return on 08/24/2011 for labs and Herceptin, 6 weeks for labs, followup exam, and Herceptin.  [name] and her husband, who have been thoroughly trained in the past are doing her PICC line flushes and dressing changes in the interim.    ______________________________ Sharyl Nimrod, PA CS/MEDQ  D:  08/03/2011  T:  08/03/2011  Job:  [id number]

## 2011-08-03 NOTE — Patient Instructions (Signed)
1200-Pt discharged ambulatory with next appointment confirmed.  Pt aware to call with any questions or concerns.  

## 2011-08-03 NOTE — Progress Notes (Signed)
Progress note dictated-CTS 

## 2011-08-06 ENCOUNTER — Other Ambulatory Visit: Payer: Self-pay | Admitting: *Deleted

## 2011-08-22 ENCOUNTER — Telehealth: Payer: Self-pay | Admitting: *Deleted

## 2011-08-22 NOTE — Telephone Encounter (Signed)
patient confirmed the time change on 09-14-2011 over the phone

## 2011-08-24 ENCOUNTER — Other Ambulatory Visit: Payer: Self-pay

## 2011-08-24 ENCOUNTER — Ambulatory Visit (HOSPITAL_BASED_OUTPATIENT_CLINIC_OR_DEPARTMENT_OTHER): Payer: Medicare Other

## 2011-08-24 ENCOUNTER — Encounter: Payer: Self-pay | Admitting: *Deleted

## 2011-08-24 ENCOUNTER — Other Ambulatory Visit: Payer: Self-pay | Admitting: Oncology

## 2011-08-24 ENCOUNTER — Other Ambulatory Visit (HOSPITAL_BASED_OUTPATIENT_CLINIC_OR_DEPARTMENT_OTHER): Payer: Medicare Other

## 2011-08-24 ENCOUNTER — Other Ambulatory Visit: Payer: Self-pay | Admitting: *Deleted

## 2011-08-24 VITALS — BP 146/87 | HR 69 | Temp 98.6°F

## 2011-08-24 DIAGNOSIS — I871 Compression of vein: Secondary | ICD-10-CM

## 2011-08-24 DIAGNOSIS — C50919 Malignant neoplasm of unspecified site of unspecified female breast: Secondary | ICD-10-CM

## 2011-08-24 DIAGNOSIS — Z5112 Encounter for antineoplastic immunotherapy: Secondary | ICD-10-CM

## 2011-08-24 DIAGNOSIS — C50419 Malignant neoplasm of upper-outer quadrant of unspecified female breast: Secondary | ICD-10-CM

## 2011-08-24 LAB — CBC WITH DIFFERENTIAL/PLATELET
BASO%: 0.3 % (ref 0.0–2.0)
Basophils Absolute: 0 10*3/uL (ref 0.0–0.1)
EOS%: 3.3 % (ref 0.0–7.0)
HCT: 29.8 % — ABNORMAL LOW (ref 34.8–46.6)
HGB: 10.2 g/dL — ABNORMAL LOW (ref 11.6–15.9)
MCH: 26 pg (ref 25.1–34.0)
MCHC: 34.2 g/dL (ref 31.5–36.0)
MCV: 76 fL — ABNORMAL LOW (ref 79.5–101.0)
MONO%: 8.6 % (ref 0.0–14.0)
NEUT%: 50 % (ref 38.4–76.8)
RDW: 16.3 % — ABNORMAL HIGH (ref 11.2–14.5)

## 2011-08-24 LAB — PROTIME-INR: INR: 1.3 — ABNORMAL LOW (ref 2.00–3.50)

## 2011-08-24 LAB — COMPREHENSIVE METABOLIC PANEL
Alkaline Phosphatase: 68 U/L (ref 39–117)
BUN: 7 mg/dL (ref 6–23)
Glucose, Bld: 100 mg/dL — ABNORMAL HIGH (ref 70–99)
Total Bilirubin: 0.7 mg/dL (ref 0.3–1.2)

## 2011-08-24 MED ORDER — ESZOPICLONE 3 MG PO TABS: 3.0000 mg | ORAL_TABLET | Freq: Every day | ORAL | Status: DC

## 2011-08-24 MED ORDER — DIPHENHYDRAMINE HCL 25 MG PO CAPS
50.0000 mg | ORAL_CAPSULE | Freq: Once | ORAL | Status: AC
  Administered 2011-08-24: 50 mg via ORAL

## 2011-08-24 MED ORDER — MORPHINE SULFATE 10 MG/ML IJ SOLN
1.0000 mg | Freq: Once | INTRAMUSCULAR | Status: AC
  Administered 2011-08-24: 1 mg via INTRAVENOUS

## 2011-08-24 MED ORDER — TRASTUZUMAB CHEMO INJECTION 440 MG
6.0000 mg/kg | Freq: Once | INTRAVENOUS | Status: AC
  Administered 2011-08-24: 735 mg via INTRAVENOUS
  Filled 2011-08-24: qty 35

## 2011-08-24 MED ORDER — ACETAMINOPHEN 325 MG PO TABS
650.0000 mg | ORAL_TABLET | Freq: Once | ORAL | Status: AC
  Administered 2011-08-24: 650 mg via ORAL

## 2011-08-24 MED ORDER — CYCLOBENZAPRINE HCL 10 MG PO TABS: 10.0000 mg | ORAL_TABLET | Freq: Three times a day (TID) | ORAL | Status: DC | PRN

## 2011-08-24 MED ORDER — SODIUM CHLORIDE 0.9 % IV SOLN
Freq: Once | INTRAVENOUS | Status: AC
  Administered 2011-08-24: 10:00:00 via INTRAVENOUS

## 2011-08-24 NOTE — Progress Notes (Unsigned)
Pt reports that she is "supposed to be taking 7.5mg  of coumadin daily) pt admits to "forgetting" to take coumadin "quite often" Per CS, pt is to remain on current dose of coumadin and repeat labs as scheduled 09/14/11

## 2011-08-27 ENCOUNTER — Other Ambulatory Visit: Payer: Self-pay | Admitting: Certified Registered Nurse Anesthetist

## 2011-08-29 ENCOUNTER — Other Ambulatory Visit: Payer: Self-pay | Admitting: *Deleted

## 2011-08-29 DIAGNOSIS — C50919 Malignant neoplasm of unspecified site of unspecified female breast: Secondary | ICD-10-CM

## 2011-08-29 MED ORDER — MORPHINE SULFATE CR 60 MG PO TB12: 60.0000 mg | ORAL_TABLET | Freq: Two times a day (BID) | ORAL | Status: DC

## 2011-08-29 MED ORDER — MORPHINE SULFATE CR 60 MG PO TB12: 60.0000 mg | ORAL_TABLET | Freq: Two times a day (BID) | ORAL | Status: AC

## 2011-08-29 NOTE — Telephone Encounter (Signed)
Called patient to pick up refill on morphine.  She will send spouse to pick up script.  Only one prescription for pick up.  Script had to be re-printed on Rx paper is why two entries noted for 08-29-2011.

## 2011-08-29 NOTE — Telephone Encounter (Signed)
[name] called asking for a refill on morphine.  Will notify providers.  Reports she has to drive from [place] and needs a call for pick up when ready.

## 2011-09-11 ENCOUNTER — Other Ambulatory Visit: Payer: Self-pay | Admitting: Physician Assistant

## 2011-09-14 ENCOUNTER — Ambulatory Visit (HOSPITAL_BASED_OUTPATIENT_CLINIC_OR_DEPARTMENT_OTHER): Payer: Medicare Other | Admitting: Physician Assistant

## 2011-09-14 ENCOUNTER — Ambulatory Visit (HOSPITAL_BASED_OUTPATIENT_CLINIC_OR_DEPARTMENT_OTHER): Payer: Medicare Other

## 2011-09-14 ENCOUNTER — Other Ambulatory Visit: Payer: Medicare Other | Admitting: Lab

## 2011-09-14 VITALS — BP 165/99 | HR 75 | Temp 98.6°F | Ht 63.0 in | Wt 260.9 lb

## 2011-09-14 DIAGNOSIS — C50919 Malignant neoplasm of unspecified site of unspecified female breast: Secondary | ICD-10-CM

## 2011-09-14 DIAGNOSIS — Z5112 Encounter for antineoplastic immunotherapy: Secondary | ICD-10-CM

## 2011-09-14 DIAGNOSIS — Z7901 Long term (current) use of anticoagulants: Secondary | ICD-10-CM

## 2011-09-14 LAB — CBC WITH DIFFERENTIAL/PLATELET
Basophils Absolute: 0.1 10*3/uL (ref 0.0–0.1)
Eosinophils Absolute: 0.2 10*3/uL (ref 0.0–0.5)
HCT: 33.9 % — ABNORMAL LOW (ref 34.8–46.6)
HGB: 11.7 g/dL (ref 11.6–15.9)
LYMPH%: 37.7 % (ref 14.0–49.7)
MCV: 76 fL — ABNORMAL LOW (ref 79.5–101.0)
MONO%: 7.4 % (ref 0.0–14.0)
NEUT#: 4.4 10*3/uL (ref 1.5–6.5)
NEUT%: 52.5 % (ref 38.4–76.8)
Platelets: 198 10*3/uL (ref 145–400)

## 2011-09-14 LAB — CANCER ANTIGEN 27.29: CA 27.29: 28 U/mL (ref 0–39)

## 2011-09-14 LAB — COMPREHENSIVE METABOLIC PANEL
CO2: 21 mEq/L (ref 19–32)
Glucose, Bld: 86 mg/dL (ref 70–99)
Sodium: 140 mEq/L (ref 135–145)
Total Bilirubin: 0.7 mg/dL (ref 0.3–1.2)
Total Protein: 7.6 g/dL (ref 6.0–8.3)

## 2011-09-14 LAB — PROTIME-INR

## 2011-09-14 LAB — LACTATE DEHYDROGENASE: LDH: 169 U/L (ref 94–250)

## 2011-09-14 MED ORDER — DIPHENHYDRAMINE HCL 25 MG PO CAPS
50.0000 mg | ORAL_CAPSULE | Freq: Once | ORAL | Status: AC
  Administered 2011-09-14: 50 mg via ORAL

## 2011-09-14 MED ORDER — LORAZEPAM 2 MG/ML IJ SOLN
1.0000 mg | Freq: Once | INTRAMUSCULAR | Status: AC
  Administered 2011-09-14: 1 mg via INTRAVENOUS

## 2011-09-14 MED ORDER — ACETAMINOPHEN 325 MG PO TABS
650.0000 mg | ORAL_TABLET | Freq: Once | ORAL | Status: AC
  Administered 2011-09-14: 650 mg via ORAL

## 2011-09-14 MED ORDER — TRASTUZUMAB CHEMO INJECTION 440 MG
6.0000 mg/kg | Freq: Once | INTRAVENOUS | Status: AC
  Administered 2011-09-14: 735 mg via INTRAVENOUS
  Filled 2011-09-14: qty 35

## 2011-09-14 MED ORDER — SODIUM CHLORIDE 0.9 % IV SOLN: Freq: Once | INTRAVENOUS | Status: DC

## 2011-09-14 MED ORDER — ALPRAZOLAM 1 MG PO TABS: 1.0000 mg | ORAL_TABLET | Freq: Three times a day (TID) | ORAL | Status: DC | PRN

## 2011-09-14 NOTE — Progress Notes (Signed)
Hematology and Oncology Follow Up Visit  [name] [id number] February 24, 1951 61 y.o. 09/14/2011    HPI:   A 61 year old [place], [place], woman with a history of  metastatic HER-2 positive breast carcinoma originally diagnosed  September 2004, on maintenance q.3-week Herceptin.  2. 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  3. History of chemotherapy-induced neuropathy. Has been on Neurontin  600 mg p.o. t.i.d. in the past.  4. History of hypertension, managed by Dr. Sudie Bailey.   Interim History:     [name] is seen today prior to initiating her next every 3 week overall she feels pretty well denying any unexplained fevers chills night sweats shortness of breath or chest pain. No nausea emesis diarrhea constipation issues per se. She continues to have chronic joint pains. She needs a refill on her Xanax today. She will be due for her next to the echocardiogram February 2013 maintenance Herceptin. She has been managing her PICC line without difficulty.  A detailed review of systems is otherwise noncontributory as noted below.  Review of Systems: Constitutional:  no weight loss, fever, night sweats and feels well Eyes: uses glasses ENT:No complaints Cardiovascular: Occ. SOB with with exertion. Respiratory: no cough, shortness of breath, or wheezing Neurological: no TIA or stroke symptoms Dermatological: negative Gastrointestinal: no abdominal pain, change in bowel habits, or black or bloody stools Genito-Urinary: no dysuria, trouble voiding, or hematuria Hematological and Lymphatic: negative Breast: negative S/p B Mastectomies. Musculoskeletal: positive for - joint pain Remaining ROS negative.   Medications:   I have reviewed the patient's current medications.  Current Outpatient Prescriptions  Medication Sig Dispense Refill  . ALPRAZolam (XANAX) 1 MG tablet Take 1-2 tablets (1-2 mg total) by mouth 3 (three) times daily as needed. anxiety  90 tablet   3  . AmLODIPine Besylate (NORVASC PO) Take 20 mg by mouth every morning.       . cyclobenzaprine (FLEXERIL) 10 MG tablet Take 1 tablet (10 mg total) by mouth 3 (three) times daily as needed. Muscle spasm  90 tablet  0  . Eszopiclone (ESZOPICLONE) 3 MG TABS Take 3 mg by mouth at bedtime. sleep      . Eszopiclone 3 MG TABS Take 1 tablet (3 mg total) by mouth at bedtime. Take immediately before bedtime  30 tablet  0  . furosemide (LASIX) 80 MG tablet Take 40 mg by mouth every morning.       . gabapentin (NEURONTIN) 600 MG tablet Take 600 mg by mouth 3 (three) times daily.        Marland Kitchen losartan (COZAAR) 100 MG tablet Take 100 mg by mouth every morning.       Marland Kitchen morphine (MS CONTIN) 60 MG 12 hr tablet Take 1 tablet (60 mg total) by mouth 2 (two) times daily.  60 tablet  0  . oxyCODONE (OXY IR/ROXICODONE) 5 MG immediate release tablet Take 5 mg by mouth every 4 (four) hours as needed. Take 1-2 tabs every 3-4 hours PRN breakthrough pain.       Marland Kitchen Potassium Chloride (K-TABS PO) Take 40 mEq by mouth daily.       . prochlorperazine (COMPAZINE) 10 MG tablet Take 10 mg by mouth every 6 (six) hours as needed. nausea      . venlafaxine (EFFEXOR-XR) 37.5 MG 24 hr capsule Take 37.5 mg by mouth every evening. depression      . warfarin (COUMADIN) 2 MG tablet Take 2 mg by mouth every evening. Take 2 mg with  5 mg daily to total 7 mg      . warfarin (COUMADIN) 5 MG tablet Take 5 mg by mouth every evening. Take 5 mg with 2 mg daily to total 7 mg        Allergies:  Allergies  Allergen Reactions  . Adhesive (Tape) Other (See Comments)    Tears skin   . Penicillins Hives    Physical Exam: Filed Vitals:   09/14/11 1232  BP: 165/99  Pulse: 75  Temp: 98.6 F (37 C)   HEENT:  Sclerae anicteric, conjunctivae pink.  Oropharynx clear.  No mucositis or candidiasis.   Nodes:  No cervical, supraclavicular, or axillary lymphadenopathy palpated.  Breast Exam:  B Mastectomies, no skin nodules or obvious palp.  Masses. Lungs:  Clear to auscultation bilaterally.  No crackles, rhonchi, or wheezes.   Heart:  Regular rate and rhythm.   Abdomen:  Soft, nontender.  Positive bowel sounds.  No organomegaly or masses palpated.   Musculoskeletal:  No focal spinal tenderness to palpation.  Extremities:  Benign.  No peripheral edema or cyanosis.   Skin:  Benign.   Neuro:  Nonfocal.   Lab Results: Lab Results  Component Value Date   WBC 8.4 09/14/2011   HGB 11.7 09/14/2011   HCT 33.9* 09/14/2011   MCV 76.0* 09/14/2011   PLT 198 09/14/2011   NEUTROABS 4.4 09/14/2011     Chemistry      Component Value Date/Time   NA 141 08/24/2011 0808   K 3.5 08/24/2011 0808   CL 105 08/24/2011 0808   CO2 27 08/24/2011 0808   BUN 7 08/24/2011 0808   CREATININE 0.73 08/24/2011 0808      Component Value Date/Time   CALCIUM 9.2 08/24/2011 0808   ALKPHOS 68 08/24/2011 0808   AST 13 08/24/2011 0808   ALT <8 08/24/2011 0808   BILITOT 0.7 08/24/2011 0808          Assessment:     1. A 61 year old [place], [place], woman with a history of  metastatic HER-2 positive breast carcinoma, for next q.3-week dose  of maintenance Herceptin.  2. History of superior vena caval syndrome on Coumadin, INR in therapeutic range.  from today.  3. Recent PICC line placement.  Case has been reviewed with Dr. Pierce Crane.  Plan:     [name] will receive Herceptin today as scheduled, Ativan 1 mg has been a tumor premeds. She'll continue to receive maintenance Herceptin every 3 week basis, she'll be due for her every 3 month echocardiogram late February 2013. Refilled Xanax has been provided.   This plan was reviewed with the patient, who voices understanding and agreement.  She knows to call with any changes or problems.    Raevon Broom T, PA-C 09/14/2011

## 2011-09-14 NOTE — Patient Instructions (Signed)
Patient ambulatory out of clinic without complaints.  Dressing to PICC line changed without complication. Instructed patient to call with any issues.  Patient aware of next appointment

## 2011-10-04 ENCOUNTER — Ambulatory Visit (HOSPITAL_BASED_OUTPATIENT_CLINIC_OR_DEPARTMENT_OTHER): Payer: Medicare Other | Admitting: Physician Assistant

## 2011-10-04 ENCOUNTER — Ambulatory Visit (HOSPITAL_BASED_OUTPATIENT_CLINIC_OR_DEPARTMENT_OTHER): Payer: Medicare Other

## 2011-10-04 ENCOUNTER — Other Ambulatory Visit (HOSPITAL_BASED_OUTPATIENT_CLINIC_OR_DEPARTMENT_OTHER): Payer: Medicare Other | Admitting: Lab

## 2011-10-04 VITALS — BP 160/101 | HR 68 | Temp 98.4°F | Ht 63.0 in | Wt 260.7 lb

## 2011-10-04 DIAGNOSIS — Z5112 Encounter for antineoplastic immunotherapy: Secondary | ICD-10-CM

## 2011-10-04 DIAGNOSIS — C50919 Malignant neoplasm of unspecified site of unspecified female breast: Secondary | ICD-10-CM

## 2011-10-04 DIAGNOSIS — D702 Other drug-induced agranulocytosis: Secondary | ICD-10-CM

## 2011-10-04 DIAGNOSIS — I10 Essential (primary) hypertension: Secondary | ICD-10-CM

## 2011-10-04 DIAGNOSIS — C50419 Malignant neoplasm of upper-outer quadrant of unspecified female breast: Secondary | ICD-10-CM

## 2011-10-04 DIAGNOSIS — K551 Chronic vascular disorders of intestine: Secondary | ICD-10-CM

## 2011-10-04 DIAGNOSIS — Z7901 Long term (current) use of anticoagulants: Secondary | ICD-10-CM

## 2011-10-04 LAB — COMPREHENSIVE METABOLIC PANEL
ALT: 9 U/L (ref 0–35)
AST: 11 U/L (ref 0–37)
Albumin: 3.9 g/dL (ref 3.5–5.2)
Calcium: 9 mg/dL (ref 8.4–10.5)
Chloride: 109 mEq/L (ref 96–112)
Potassium: 3.7 mEq/L (ref 3.5–5.3)
Sodium: 142 mEq/L (ref 135–145)

## 2011-10-04 LAB — CBC WITH DIFFERENTIAL/PLATELET
BASO%: 0.3 % (ref 0.0–2.0)
EOS%: 2.3 % (ref 0.0–7.0)
HCT: 32.4 % — ABNORMAL LOW (ref 34.8–46.6)
MCH: 26 pg (ref 25.1–34.0)
MCHC: 34.9 g/dL (ref 31.5–36.0)
MCV: 74.7 fL — ABNORMAL LOW (ref 79.5–101.0)
MONO%: 4.7 % (ref 0.0–14.0)
NEUT%: 52.5 % (ref 38.4–76.8)
RDW: 15.7 % — ABNORMAL HIGH (ref 11.2–14.5)
lymph#: 3.7 10*3/uL — ABNORMAL HIGH (ref 0.9–3.3)

## 2011-10-04 LAB — PROTIME-INR: INR: 1.8 — ABNORMAL LOW (ref 2.00–3.50)

## 2011-10-04 MED ORDER — HEPARIN SOD (PORK) LOCK FLUSH 100 UNIT/ML IV SOLN
500.0000 [IU] | Freq: Once | INTRAVENOUS | Status: DC | PRN
  Filled 2011-10-04: qty 5

## 2011-10-04 MED ORDER — TRASTUZUMAB CHEMO INJECTION 440 MG
6.0000 mg/kg | Freq: Once | INTRAVENOUS | Status: AC
  Administered 2011-10-04: 735 mg via INTRAVENOUS
  Filled 2011-10-04: qty 35

## 2011-10-04 MED ORDER — DIPHENHYDRAMINE HCL 25 MG PO CAPS
50.0000 mg | ORAL_CAPSULE | Freq: Once | ORAL | Status: AC
  Administered 2011-10-04: 50 mg via ORAL

## 2011-10-04 MED ORDER — ACETAMINOPHEN 325 MG PO TABS
650.0000 mg | ORAL_TABLET | Freq: Once | ORAL | Status: AC
  Administered 2011-10-04: 650 mg via ORAL

## 2011-10-04 MED ORDER — LORAZEPAM 2 MG/ML IJ SOLN
1.0000 mg | Freq: Once | INTRAMUSCULAR | Status: AC
  Administered 2011-10-04: 1 mg via INTRAVENOUS

## 2011-10-04 MED ORDER — OXYCODONE HCL 5 MG PO TABS: 5.0000 mg | ORAL_TABLET | ORAL | Status: DC | PRN

## 2011-10-04 MED ORDER — SODIUM CHLORIDE 0.9 % IV SOLN
Freq: Once | INTRAVENOUS | Status: AC
  Administered 2011-10-04: 11:00:00 via INTRAVENOUS

## 2011-10-04 MED ORDER — MORPHINE SULFATE CR 60 MG PO TB12: 60.0000 mg | ORAL_TABLET | Freq: Two times a day (BID) | ORAL | Status: AC

## 2011-10-04 MED ORDER — SODIUM CHLORIDE 0.9 % IJ SOLN
10.0000 mL | INTRAMUSCULAR | Status: DC | PRN
  Filled 2011-10-04: qty 10

## 2011-10-04 NOTE — Progress Notes (Signed)
Central line dressing changed today; caps changed and line flushed.

## 2011-10-04 NOTE — Patient Instructions (Signed)
Patient aware of next appointment; discharged home with husband; no complaints of pain or discomfort; rxs for nausea and pain at home prn.

## 2011-10-04 NOTE — Progress Notes (Signed)
Hematology and Oncology Follow Up Visit  [name] [id number] 04-17-1951 61 y.o. 10/04/2011    HPI: A 61 year old , [place], woman with a history of  metastatic HER-2 positive breast carcinoma originally diagnosed  September 2004, on maintenance q.3-week Herceptin.  2. 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  3. History of chemotherapy-induced neuropathy. Has been on Neurontin  600 mg p.o. t.i.d. in the past.  4. History of hypertension, managed by Dr. Sudie Bailey.    Interim History:   [name] is seen today with her husband [name] in accompaniment prior to her next every 3 week dose of maintenance Herceptin, metastatic HER-2 positive breast carcinoma. Overall she is doing well she denies any unexplained fevers chills night sweats shortness of breath chest pain no unexplained nausea emesis diarrhea or constipation issues. She did have a recent "cold", which has completely abated. She denies any new bony discomfort, she has chronic knee issues, she is on MS Contin 60 mg by mouth twice a day but admits she does not take it on a regular basis. She also continues on gabapentin, but again not consistently. Of note she has a PICC line that was placed in the right anterior chest wall via interventional radiology on 07/24/2011. Her husband continues to flush the PICC on a 3 times a week basis. Of note, she scheduled for her repeat 2-D echocardiogram on 10/24/2011.  A detailed review of systems is otherwise noncontributory as noted below.  Review of Systems: Constitutional:  no weight loss, fever, night sweats and feels well Eyes: uses glasses ENT: No complaints Cardiovascular: no chest pain or dyspnea on exertion Respiratory: no cough, shortness of breath, or wheezing Neurological: no TIA or stroke symptoms Dermatological: negative Gastrointestinal: no abdominal pain, change in bowel habits, or black or bloody stools Genito-Urinary: no dysuria, trouble voiding,  or hematuria Hematological and Lymphatic: negative Breast: negative Musculoskeletal: positive for - joint pain, chronic B knees Remaining ROS negative.   Medications:   I have reviewed the patient's current medications.  Current Outpatient Prescriptions  Medication Sig Dispense Refill  . ALPRAZolam (XANAX) 1 MG tablet Take 1-2 tablets (1-2 mg total) by mouth 3 (three) times daily as needed. anxiety  90 tablet  3  . AmLODIPine Besylate (NORVASC PO) Take 20 mg by mouth every morning.       . cyclobenzaprine (FLEXERIL) 10 MG tablet Take 1 tablet (10 mg total) by mouth 3 (three) times daily as needed. Muscle spasm  90 tablet  0  . Eszopiclone (ESZOPICLONE) 3 MG TABS Take 3 mg by mouth at bedtime. sleep      . Eszopiclone 3 MG TABS Take 1 tablet (3 mg total) by mouth at bedtime. Take immediately before bedtime  30 tablet  0  . furosemide (LASIX) 80 MG tablet Take 40 mg by mouth every morning.       . gabapentin (NEURONTIN) 600 MG tablet Take 600 mg by mouth 3 (three) times daily.        Marland Kitchen losartan (COZAAR) 100 MG tablet Take 100 mg by mouth every morning.       . Potassium Chloride (K-TABS PO) Take 40 mEq by mouth daily.       . prochlorperazine (COMPAZINE) 10 MG tablet Take 10 mg by mouth every 6 (six) hours as needed. nausea      . venlafaxine (EFFEXOR-XR) 37.5 MG 24 hr capsule Take 37.5 mg by mouth every evening. depression      . warfarin (COUMADIN) 2  MG tablet Take 2 mg by mouth every evening. Take 2 mg with 5 mg daily to total 7 mg      . warfarin (COUMADIN) 5 MG tablet Take 5 mg by mouth every evening. Take 5 mg with 2 mg daily to total 7 mg      . DISCONTD: oxyCODONE (OXY IR/ROXICODONE) 5 MG immediate release tablet Take 5 mg by mouth every 4 (four) hours as needed. Take 1-2 tabs every 3-4 hours PRN breakthrough pain.       Marland Kitchen morphine (MS CONTIN) 60 MG 12 hr tablet Take 1 tablet (60 mg total) by mouth 2 (two) times daily.  60 tablet  0  . oxyCODONE (OXY IR/ROXICODONE) 5 MG immediate  release tablet Take 1 tablet (5 mg total) by mouth every 4 (four) hours as needed for pain.  100 tablet  0    Allergies:  Allergies  Allergen Reactions  . Adhesive (Tape) Other (See Comments)    Tears skin   . Penicillins Hives    Physical Exam: Filed Vitals:   10/04/11 0839  BP: 160/101  Pulse: 68  Temp: 98.4 F (36.9 C)   HEENT:  Sclerae anicteric, conjunctivae pink.  Oropharynx clear.  No mucositis or candidiasis.   Nodes:  No cervical, supraclavicular, or axillary lymphadenopathy palpated.  Breast Exam:  B mastectomy scars benign, no evidence of skin nodules or masses. Lungs:  Clear to auscultation bilaterally.  No crackles, rhonchi, or wheezes.   Heart:  Regular rate and rhythm.   Abdomen:  Soft, nontender.  Positive bowel sounds.  No organomegaly or masses palpated.   Musculoskeletal:  No focal spinal tenderness to palpation.  Extremities:  Benign.  No peripheral edema or cyanosis.   Skin:  Benign.   Neuro:  Nonfocal, alert and oriented x 3.   Lab Results: Lab Results  Component Value Date   WBC 9.2 10/04/2011   HGB 11.3* 10/04/2011   HCT 32.4* 10/04/2011   MCV 74.7* 10/04/2011   PLT 297 10/04/2011   NEUTROABS 4.8 10/04/2011     Chemistry      Component Value Date/Time   NA 140 09/14/2011 1127   K 3.6 09/14/2011 1127   CL 105 09/14/2011 1127   CO2 21 09/14/2011 1127   BUN 11 09/14/2011 1127   CREATININE 1.08 09/14/2011 1127      Component Value Date/Time   CALCIUM 9.2 09/14/2011 1127   ALKPHOS 80 09/14/2011 1127   AST 11 09/14/2011 1127   ALT <8 09/14/2011 1127   BILITOT 0.7 09/14/2011 1127         Assessment:  A 61 year old [place], [place], woman with a history of  metastatic HER-2 positive breast carcinoma originally diagnosed  September 2004, on maintenance q.3-week Herceptin.  2. 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  3. History of chemotherapy-induced neuropathy. Has been on Neurontin  600 mg p.o. t.i.d. in the past.  4.  History of hypertension, managed by Dr. Sudie Bailey.   Case reviewed with Dr. Pierce Crane.  Plan:  [name] will receive Herceptin today as scheduled, Ativan has been at her premeds. I have refilled her MS Contin 60 mg by mouth twice a day and oxycodone. She was encouraged to keep her appointment is scheduled for repeat 2-D echocardiogram, I will see her back on 10/26/2011 prior to her next Herceptin dosing. She understands and agrees with this plan.   This plan was reviewed with the patient, who voices understanding and agreement.  She knows  to call with any changes or problems.    Teiara Baria T, PA-C 10/04/2011

## 2011-10-05 ENCOUNTER — Telehealth: Payer: Self-pay | Admitting: *Deleted

## 2011-10-05 NOTE — Telephone Encounter (Signed)
Pt. Called 10/04/11 to request that home health teach and provide supplies for husband to flush and change dressing of PICC line.  Discussed with Dr. Donnie Coffin and he is fine with this.  [facility] [phone].  Faxed them all requested information to [phone].  Requested that they begin tomorrow.  Called Joniya and let her know that this was in progress and to expect a call from [facility].  Gave her their number in case she does not hear from them.

## 2011-10-09 ENCOUNTER — Other Ambulatory Visit: Payer: Self-pay | Admitting: Oncology

## 2011-10-09 DIAGNOSIS — I871 Compression of vein: Secondary | ICD-10-CM

## 2011-10-09 DIAGNOSIS — C50919 Malignant neoplasm of unspecified site of unspecified female breast: Secondary | ICD-10-CM

## 2011-10-24 ENCOUNTER — Ambulatory Visit (HOSPITAL_COMMUNITY)
Admission: RE | Admit: 2011-10-24 | Discharge: 2011-10-24 | Disposition: A | Discharge status: Home / Self Care | Payer: Medicare Other | Source: Ambulatory Visit | Attending: Oncology | Admitting: Oncology

## 2011-10-24 ENCOUNTER — Other Ambulatory Visit: Payer: Self-pay | Admitting: *Deleted

## 2011-10-24 DIAGNOSIS — Z09 Encounter for follow-up examination after completed treatment for conditions other than malignant neoplasm: Secondary | ICD-10-CM | POA: Insufficient documentation

## 2011-10-24 DIAGNOSIS — C50919 Malignant neoplasm of unspecified site of unspecified female breast: Secondary | ICD-10-CM

## 2011-10-24 DIAGNOSIS — Z9010 Acquired absence of unspecified breast and nipple: Secondary | ICD-10-CM | POA: Insufficient documentation

## 2011-10-24 DIAGNOSIS — Z8249 Family history of ischemic heart disease and other diseases of the circulatory system: Secondary | ICD-10-CM | POA: Insufficient documentation

## 2011-10-24 DIAGNOSIS — Z79899 Other long term (current) drug therapy: Secondary | ICD-10-CM | POA: Insufficient documentation

## 2011-10-24 DIAGNOSIS — I059 Rheumatic mitral valve disease, unspecified: Secondary | ICD-10-CM

## 2011-10-24 DIAGNOSIS — I10 Essential (primary) hypertension: Secondary | ICD-10-CM | POA: Insufficient documentation

## 2011-10-24 DIAGNOSIS — M7989 Other specified soft tissue disorders: Secondary | ICD-10-CM | POA: Insufficient documentation

## 2011-10-24 NOTE — Progress Notes (Signed)
  Echocardiogram 2D Echocardiogram has been performed.  Cathie Beams Deneen 10/24/2011, 11:06 AM

## 2011-10-26 ENCOUNTER — Encounter: Payer: Self-pay | Admitting: Physician Assistant

## 2011-10-26 ENCOUNTER — Other Ambulatory Visit: Payer: Self-pay | Admitting: *Deleted

## 2011-10-26 ENCOUNTER — Telehealth: Payer: Self-pay | Admitting: Oncology

## 2011-10-26 ENCOUNTER — Ambulatory Visit (HOSPITAL_BASED_OUTPATIENT_CLINIC_OR_DEPARTMENT_OTHER): Payer: Medicare Other

## 2011-10-26 ENCOUNTER — Other Ambulatory Visit: Payer: Medicare Other | Admitting: Lab

## 2011-10-26 ENCOUNTER — Ambulatory Visit (HOSPITAL_BASED_OUTPATIENT_CLINIC_OR_DEPARTMENT_OTHER): Payer: Medicare Other | Admitting: Physician Assistant

## 2011-10-26 DIAGNOSIS — C50919 Malignant neoplasm of unspecified site of unspecified female breast: Secondary | ICD-10-CM

## 2011-10-26 DIAGNOSIS — Z7901 Long term (current) use of anticoagulants: Secondary | ICD-10-CM

## 2011-10-26 DIAGNOSIS — I871 Compression of vein: Secondary | ICD-10-CM

## 2011-10-26 DIAGNOSIS — Z5112 Encounter for antineoplastic immunotherapy: Secondary | ICD-10-CM

## 2011-10-26 LAB — CBC WITH DIFFERENTIAL/PLATELET
BASO%: 0.3 % (ref 0.0–2.0)
EOS%: 3.5 % (ref 0.0–7.0)
MCH: 26.6 pg (ref 25.1–34.0)
MCHC: 35.3 g/dL (ref 31.5–36.0)
MCV: 75.2 fL — ABNORMAL LOW (ref 79.5–101.0)
MONO%: 6.7 % (ref 0.0–14.0)
NEUT%: 57.4 % (ref 38.4–76.8)
RDW: 15.7 % — ABNORMAL HIGH (ref 11.2–14.5)
lymph#: 2.9 10*3/uL (ref 0.9–3.3)

## 2011-10-26 LAB — BASIC METABOLIC PANEL
BUN: 19 mg/dL (ref 6–23)
Calcium: 8.6 mg/dL (ref 8.4–10.5)
Glucose, Bld: 90 mg/dL (ref 70–99)
Potassium: 3.6 mEq/L (ref 3.5–5.3)
Sodium: 142 mEq/L (ref 135–145)

## 2011-10-26 LAB — PROTIME-INR
INR: 3.6 — ABNORMAL HIGH (ref 2.00–3.50)
Protime: 43.2 Seconds — ABNORMAL HIGH (ref 10.6–13.4)

## 2011-10-26 MED ORDER — DIPHENHYDRAMINE HCL 25 MG PO CAPS
50.0000 mg | ORAL_CAPSULE | Freq: Once | ORAL | Status: AC
  Administered 2011-10-26: 50 mg via ORAL

## 2011-10-26 MED ORDER — LORAZEPAM 2 MG/ML IJ SOLN
1.0000 mg | Freq: Once | INTRAMUSCULAR | Status: AC
  Administered 2011-10-26: 1 mg via INTRAVENOUS

## 2011-10-26 MED ORDER — ACETAMINOPHEN 325 MG PO TABS
650.0000 mg | ORAL_TABLET | Freq: Once | ORAL | Status: AC
  Administered 2011-10-26: 650 mg via ORAL

## 2011-10-26 MED ORDER — TRASTUZUMAB CHEMO INJECTION 440 MG
6.0000 mg/kg | Freq: Once | INTRAVENOUS | Status: AC
  Administered 2011-10-26: 714 mg via INTRAVENOUS
  Filled 2011-10-26: qty 34

## 2011-10-26 MED ORDER — SODIUM CHLORIDE 0.9 % IV SOLN: Freq: Once | INTRAVENOUS | Status: DC

## 2011-10-26 NOTE — Progress Notes (Signed)
Hematology and Oncology Follow Up Visit  KARTHIKA GLASPER [id number] [birth year] 61 y.o. 10/26/2011    HPI: A 61 year old , [place], woman with a history of metastatic HER-2 positive breast carcinoma originally diagnosed September 2004, on maintenance q.3-week Herceptin.  2. 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  3. History of chemotherapy-induced neuropathy. Has been on Neurontin  600 mg p.o. t.i.d. in the past.  4. History of hypertension, managed by Dr. Sudie Bailey.    Interim History:   [name] is seen today with her husband [name] in accompaniment prior to her next every 2 week dose of maintenance Herceptin for metastatic HER-2 positive breast carcinoma. At this point, she is wondering about possibly having a drug holiday. She would like to restage at this point in time. She is willing to proceed with treatment today, of note she had a 2-D echocardiogram performed on 10/24/2011 which revealed a left ventricular ejection fraction between 60 and 65%. Overall she feels okay, she continues to have intermittent chest wall pain on the anterior chest wall bilaterally, no real reason. She denies frank shortness of breath. She denies any nausea, emesis, diarrhea or constipation issues. She continues to have joint pain. She does not require refill prescriptions today.  A detailed review of systems is otherwise noncontributory as noted below.  Review of Systems: Constitutional:  no weight loss, fever, night sweats and feels well Eyes: uses glasses ENT: No complaints Cardiovascular: no chest pain or dyspnea on exertion Respiratory: no cough, shortness of breath, or wheezing Neurological: no TIA or stroke symptoms Dermatological: negative Gastrointestinal: no abdominal pain, change in bowel habits, or black or bloody stools Genito-Urinary: no dysuria, trouble voiding, or hematuria Hematological and Lymphatic: negative Breast: negative Musculoskeletal: positive for  - joint pain, chronic B knees Remaining ROS negative.   Medications:   I have reviewed the patient's current medications.  Current Outpatient Prescriptions  Medication Sig Dispense Refill  . ALPRAZolam (XANAX) 1 MG tablet Take 1-2 tablets (1-2 mg total) by mouth 3 (three) times daily as needed. anxiety  90 tablet  3  . AmLODIPine Besylate (NORVASC PO) Take 20 mg by mouth every morning.       . cyclobenzaprine (FLEXERIL) 10 MG tablet Take 1 tablet (10 mg total) by mouth 3 (three) times daily as needed. Muscle spasm  90 tablet  0  . Eszopiclone (ESZOPICLONE) 3 MG TABS Take 3 mg by mouth at bedtime. sleep      . Eszopiclone 3 MG TABS Take 1 tablet (3 mg total) by mouth at bedtime. Take immediately before bedtime  30 tablet  0  . furosemide (LASIX) 80 MG tablet Take 40 mg by mouth every morning.       . gabapentin (NEURONTIN) 600 MG tablet Take 600 mg by mouth 3 (three) times daily.        Marland Kitchen losartan (COZAAR) 100 MG tablet Take 100 mg by mouth every morning.       Marland Kitchen morphine (MS CONTIN) 60 MG 12 hr tablet Take 1 tablet (60 mg total) by mouth 2 (two) times daily.  60 tablet  0  . oxyCODONE (OXY IR/ROXICODONE) 5 MG immediate release tablet Take 1 tablet (5 mg total) by mouth every 4 (four) hours as needed for pain.  100 tablet  0  . Potassium Chloride (K-TABS PO) Take 40 mEq by mouth daily.       . prochlorperazine (COMPAZINE) 10 MG tablet Take 10 mg by mouth every 6 (six) hours as  needed. nausea      . venlafaxine (EFFEXOR-XR) 37.5 MG 24 hr capsule Take 37.5 mg by mouth every evening. depression      . warfarin (COUMADIN) 2 MG tablet Take 2 mg by mouth every evening. Take 2 mg with 5 mg daily to total 7 mg      . warfarin (COUMADIN) 5 MG tablet TAKE 1 TABLET BY MOUTH EVERY DAY AS DIRECTED  30 tablet  2    Allergies:  Allergies  Allergen Reactions  . Adhesive (Tape) Other (See Comments)    Tears skin   . Penicillins Hives    Physical Exam: Filed Vitals:   10/26/11 0837  BP: 169/106    Pulse: 73  Temp: 97.9 F (36.6 C)  Weight: 264 lbs. HEENT:  Sclerae anicteric, conjunctivae pink.  Oropharynx clear.  No mucositis or candidiasis.   Nodes:  No cervical, supraclavicular, or axillary lymphadenopathy palpated.  Breast Exam:  B mastectomy scars benign, no evidence of skin nodules or masses. Lungs:  Clear to auscultation bilaterally.  No crackles, rhonchi, or wheezes.   Heart:  Regular rate and rhythm.   Abdomen:  Soft, nontender.  Positive bowel sounds.  No organomegaly or masses palpated.   Musculoskeletal:  No focal spinal tenderness to palpation.  Extremities:  Benign.  No peripheral edema or cyanosis.   Skin:  Benign.   Neuro:  Nonfocal, alert and oriented x 3.   Lab Results: Lab Results  Component Value Date   WBC 8.9 10/26/2011   HGB 11.9 10/26/2011   HCT 33.7* 10/26/2011   MCV 75.2* 10/26/2011   PLT 240 10/26/2011   NEUTROABS 5.1 10/26/2011     Chemistry      Component Value Date/Time   NA 142 10/04/2011 0805   K 3.7 10/04/2011 0805   CL 109 10/04/2011 0805   CO2 22 10/04/2011 0805   BUN 17 10/04/2011 0805   CREATININE 0.74 10/04/2011 0805      Component Value Date/Time   CALCIUM 9.0 10/04/2011 0805   ALKPHOS 92 10/04/2011 0805   AST 11 10/04/2011 0805   ALT 9 10/04/2011 0805   BILITOT 0.4 10/04/2011 0805     Echocardiogram: 10/24/11 Study Conclusions - Left ventricle: The cavity size was normal. Wall thickness was normal. Systolic function was normal. The estimated ejection fraction was in the range of 60% to 65%. Regional wall motion abnormalities cannot be excluded. - Mitral valve: Mild regurgitation. - Line: A venous catheter was visualized in the right atrium. Transthoracic echocardiography. M-mode, complete 2D, spectral Doppler, and color Doppler. Height: Height: 157.5cm. Height: 62in. Weight: Weight: 118.2kg. Weight: 260lb. Body mass index: BMI: 47.7kg/m^2. Body surface area: BSA: 2.63m^2. Patient status: Outpatient. Location: Echo laboratory. Prepared and  Electronically Authenticated by Olga Millers, MD, [facility]    Assessment:  A 61 year old [place], [place], woman with a history of metastatic HER-2 positive breast carcinoma originally diagnosed September 2004, on maintenance q.3-week Herceptin.  2. 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  3. History of chemotherapy-induced neuropathy. Has been on Neurontin  600 mg p.o. t.i.d. in the past.  4. History of hypertension, managed by Dr. Sudie Bailey.   Case reviewed with Dr. Pierce Crane.  Plan:  [name] will receive Herceptin today as scheduled, Ativan has been at her premeds. She will return in 3 weeks' time to discuss her PET scan and CT scan results, and to determine whether we are to continue with Herceptin or give her a drug holiday at this point  in time. She understands and agrees with this plan.   This plan was reviewed with the patient, who voices understanding and agreement.  She knows to call with any changes or problems.    Nechelle Petrizzo T, PA-C 10/26/2011

## 2011-10-26 NOTE — Telephone Encounter (Signed)
gve the pt her ct scan/pet scan appt

## 2011-10-29 ENCOUNTER — Telehealth: Payer: Self-pay

## 2011-10-29 NOTE — Telephone Encounter (Signed)
ORDER CLARIFYING CT FOR CT ABD/PELVIS  FOR 11-08-11.

## 2011-11-06 ENCOUNTER — Other Ambulatory Visit: Payer: Self-pay | Admitting: Physician Assistant

## 2011-11-07 ENCOUNTER — Other Ambulatory Visit: Payer: Self-pay | Admitting: *Deleted

## 2011-11-07 DIAGNOSIS — G4700 Insomnia, unspecified: Secondary | ICD-10-CM

## 2011-11-07 DIAGNOSIS — C50919 Malignant neoplasm of unspecified site of unspecified female breast: Secondary | ICD-10-CM

## 2011-11-07 MED ORDER — ESZOPICLONE 3 MG PO TABS: 3.0000 mg | ORAL_TABLET | Freq: Every day | ORAL | Status: DC

## 2011-11-08 ENCOUNTER — Encounter: Payer: Self-pay | Admitting: *Deleted

## 2011-11-08 ENCOUNTER — Telehealth: Payer: Self-pay | Admitting: *Deleted

## 2011-11-08 ENCOUNTER — Other Ambulatory Visit: Payer: Self-pay | Admitting: *Deleted

## 2011-11-08 ENCOUNTER — Other Ambulatory Visit: Payer: Self-pay | Admitting: Physician Assistant

## 2011-11-08 ENCOUNTER — Inpatient Hospital Stay (HOSPITAL_COMMUNITY)
Admission: RE | Admit: 2011-11-08 | Discharge: 2011-11-08 | Payer: Medicare Other | Source: Ambulatory Visit | Attending: Physician Assistant | Admitting: Physician Assistant

## 2011-11-08 ENCOUNTER — Other Ambulatory Visit (HOSPITAL_COMMUNITY): Payer: Medicare Other

## 2011-11-08 DIAGNOSIS — C50919 Malignant neoplasm of unspecified site of unspecified female breast: Secondary | ICD-10-CM

## 2011-11-08 NOTE — Progress Notes (Signed)
Notified  Lawson Fiscal to add pt to CS schedule 11/09/11. Pt has been notified

## 2011-11-08 NOTE — Telephone Encounter (Signed)
per victoria she contacted the patient about the appointment with labs at 10:15am and schere at 10:45am

## 2011-11-09 ENCOUNTER — Other Ambulatory Visit (HOSPITAL_BASED_OUTPATIENT_CLINIC_OR_DEPARTMENT_OTHER): Payer: Medicare Other | Admitting: Lab

## 2011-11-09 ENCOUNTER — Ambulatory Visit (HOSPITAL_BASED_OUTPATIENT_CLINIC_OR_DEPARTMENT_OTHER): Payer: Medicare Other | Admitting: Physician Assistant

## 2011-11-09 VITALS — BP 187/118 | HR 73 | Temp 98.4°F | Ht 63.0 in | Wt 258.7 lb

## 2011-11-09 DIAGNOSIS — J40 Bronchitis, not specified as acute or chronic: Secondary | ICD-10-CM

## 2011-11-09 DIAGNOSIS — C50919 Malignant neoplasm of unspecified site of unspecified female breast: Secondary | ICD-10-CM

## 2011-11-09 DIAGNOSIS — C801 Malignant (primary) neoplasm, unspecified: Secondary | ICD-10-CM

## 2011-11-09 LAB — CBC WITH DIFFERENTIAL/PLATELET
BASO%: 0.2 % (ref 0.0–2.0)
Basophils Absolute: 0 10*3/uL (ref 0.0–0.1)
EOS%: 2.7 % (ref 0.0–7.0)
HGB: 11.5 g/dL — ABNORMAL LOW (ref 11.6–15.9)
MCH: 25.8 pg (ref 25.1–34.0)
MONO#: 0.6 10*3/uL (ref 0.1–0.9)
RDW: 16.1 % — ABNORMAL HIGH (ref 11.2–14.5)
WBC: 8.9 10*3/uL (ref 3.9–10.3)
lymph#: 2.8 10*3/uL (ref 0.9–3.3)

## 2011-11-09 MED ORDER — AZITHROMYCIN 250 MG PO TABS: ORAL_TABLET | ORAL | Status: AC

## 2011-11-09 MED ORDER — IPRATROPIUM-ALBUTEROL 18-103 MCG/ACT IN AERO: 2.0000 | INHALATION_SPRAY | Freq: Four times a day (QID) | RESPIRATORY_TRACT | Status: AC | PRN

## 2011-11-09 NOTE — Progress Notes (Signed)
Hematology and Oncology Follow Up Visit  [name] [id number] 1950/09/28 61 y.o. 11/09/2011    HPI: A 61 year old [place], [place], woman with a history of metastatic HER-2 positive breast carcinoma originally diagnosed September 2004, on maintenance q.3-week Herceptin.  2. 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  3. History of chemotherapy-induced neuropathy. Has been on Neurontin  600 mg p.o. t.i.d. in the past.  4. History of hypertension, managed by Dr. Sudie Bailey.    Interim History:   [name] is seen today between scheduled clinic visit after she contacted our office yesterday stating that she has had a bad cold, with associated wheezing. She feels the symptoms are improving, but it was felt prudent for assessment today. She denies any current fevers, she has a productive cough with clear sputum. She does have some fatigue.  Review of Systems: Constitutional:  no weight loss, fever, night sweats and feels well Eyes: uses glasses ENT: No complaints Cardiovascular: no chest pain or dyspnea on exertion Respiratory: no cough, shortness of breath, she does have evidence of wheezing. Neurological: no TIA or stroke symptoms Dermatological: negative Gastrointestinal: no abdominal pain, change in bowel habits, or black or bloody stools Genito-Urinary: no dysuria, trouble voiding, or hematuria Hematological and Lymphatic: negative Breast: negative Musculoskeletal: positive for - joint pain, chronic B knees Remaining ROS negative.   Medications:   I have reviewed the patient's current medications.  Current Outpatient Prescriptions  Medication Sig Dispense Refill  . albuterol-ipratropium (COMBIVENT) 18-103 MCG/ACT inhaler Inhale 2 puffs into the lungs every 6 (six) hours as needed for wheezing.  1 Inhaler  0  . ALPRAZolam (XANAX) 1 MG tablet Take 1-2 tablets (1-2 mg total) by mouth 3 (three) times daily as needed. anxiety  90 tablet  3  . AmLODIPine  Besylate (NORVASC PO) Take 20 mg by mouth every morning.       Marland Kitchen azithromycin (ZITHROMAX Z-PAK) 250 MG tablet Take as directed  6 each  0  . cyclobenzaprine (FLEXERIL) 10 MG tablet TAKE 1 TABLET BY MOUTH 3 TIMES A DAY AS NEEDED FOR MUSCLE SPASM  90 tablet  0  . Eszopiclone (ESZOPICLONE) 3 MG TABS Take 1 tablet (3 mg total) by mouth at bedtime. sleep  30 tablet  0  . Eszopiclone 3 MG TABS Take 1 tablet (3 mg total) by mouth at bedtime. Take immediately before bedtime  30 tablet  0  . furosemide (LASIX) 80 MG tablet Take 40 mg by mouth every morning.       . gabapentin (NEURONTIN) 600 MG tablet Take 600 mg by mouth 3 (three) times daily.        Marland Kitchen losartan (COZAAR) 100 MG tablet Take 100 mg by mouth every morning.       Marland Kitchen oxyCODONE (OXY IR/ROXICODONE) 5 MG immediate release tablet Take 1 tablet (5 mg total) by mouth every 4 (four) hours as needed for pain.  100 tablet  0  . Potassium Chloride (K-TABS PO) Take 40 mEq by mouth daily.       . prochlorperazine (COMPAZINE) 10 MG tablet Take 10 mg by mouth every 6 (six) hours as needed. nausea      . venlafaxine (EFFEXOR-XR) 37.5 MG 24 hr capsule Take 37.5 mg by mouth every evening. depression      . warfarin (COUMADIN) 2 MG tablet Take 2 mg by mouth every evening. Take 2 mg with 5 mg daily to total 7 mg      . warfarin (COUMADIN) 5  MG tablet TAKE 1 TABLET BY MOUTH EVERY DAY AS DIRECTED  30 tablet  2    Allergies:  Allergies  Allergen Reactions  . Adhesive (Tape) Other (See Comments)    Tears skin   . Penicillins Hives    Physical Exam: Filed Vitals:   11/09/11 1025  BP: 187/118  Pulse: 73  Temp: 98.4 F (36.9 C)  Weight: 258 lbs. HEENT:  Sclerae anicteric, conjunctivae pink.  Oropharynx clear.  No mucositis or candidiasis.   Nodes:  No cervical, supraclavicular, or axillary lymphadenopathy palpated.  Lungs:  Clear to auscultation bilaterally.  No crackles, rhonchi, or wheezes.   Heart:  Regular rate and rhythm.    Lab Results: Lab  Results  Component Value Date   WBC 8.9 11/09/2011   HGB 11.5* 11/09/2011   HCT 33.0* 11/09/2011   MCV 74.2* 11/09/2011   PLT 323 11/09/2011   NEUTROABS 5.2 11/09/2011     Chemistry      Component Value Date/Time   NA 142 10/26/2011 0817   K 3.6 10/26/2011 0817   CL 104 10/26/2011 0817   CO2 28 10/26/2011 0817   BUN 19 10/26/2011 0817   CREATININE 1.08 10/26/2011 0817      Component Value Date/Time   CALCIUM 8.6 10/26/2011 0817   ALKPHOS 92 10/04/2011 0805   AST 11 10/04/2011 0805   ALT 9 10/04/2011 0805   BILITOT 0.4 10/04/2011 0805     Echocardiogram: 10/24/11 Study Conclusions - Left ventricle: The cavity size was normal. Wall thickness was normal. Systolic function was normal. The estimated ejection fraction was in the range of 60% to 65%. Regional wall motion abnormalities cannot be excluded. - Mitral valve: Mild regurgitation. - Line: A venous catheter was visualized in the right atrium. Transthoracic echocardiography. M-mode, complete 2D, spectral Doppler, and color Doppler. Height: Height: 157.5cm. Height: 62in. Weight: Weight: 118.2kg. Weight: 260lb. Body mass index: BMI: 47.7kg/m^2. Body surface area: BSA: 2.36m^2. Patient status: Outpatient. Location: Echo laboratory. Prepared and Electronically Authenticated by Olga Millers, MD, [facility]    Assessment:  A 61 year old [place], [place], woman with a history of metastatic HER-2 positive breast carcinoma originally diagnosed September 2004, on maintenance q.3-week Herceptin.  2. 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  3. History of chemotherapy-induced neuropathy. Has been on Neurontin  600 mg p.o. t.i.d. in the past.  4. History of hypertension, managed by Dr. Sudie Bailey.  5. Bronchitis  Case reviewed with Dr. Pierce Crane.  Plan:  [name] will be placed on Zithromax and albuterol inhaler. She will keep her appointment as scheduled on 11/16/2011 with repeat labs and possible Herceptin. This plan was  reviewed with the patient, who voices understanding and agreement.  She knows to call with any changes or problems.    Ameli Sangiovanni T, PA-C 11/09/2011

## 2011-11-16 ENCOUNTER — Ambulatory Visit (HOSPITAL_BASED_OUTPATIENT_CLINIC_OR_DEPARTMENT_OTHER): Payer: Medicare Other

## 2011-11-16 ENCOUNTER — Ambulatory Visit (HOSPITAL_BASED_OUTPATIENT_CLINIC_OR_DEPARTMENT_OTHER): Payer: Medicare Other | Admitting: Physician Assistant

## 2011-11-16 ENCOUNTER — Other Ambulatory Visit (HOSPITAL_BASED_OUTPATIENT_CLINIC_OR_DEPARTMENT_OTHER): Payer: Medicare Other | Admitting: Lab

## 2011-11-16 VITALS — BP 140/102 | HR 83 | Temp 98.7°F | Ht 63.0 in | Wt 265.0 lb

## 2011-11-16 DIAGNOSIS — Z5112 Encounter for antineoplastic immunotherapy: Secondary | ICD-10-CM

## 2011-11-16 DIAGNOSIS — C50919 Malignant neoplasm of unspecified site of unspecified female breast: Secondary | ICD-10-CM

## 2011-11-16 DIAGNOSIS — I871 Compression of vein: Secondary | ICD-10-CM

## 2011-11-16 LAB — COMPREHENSIVE METABOLIC PANEL
ALT: 9 U/L (ref 0–35)
CO2: 24 mEq/L (ref 19–32)
Calcium: 9.1 mg/dL (ref 8.4–10.5)
Chloride: 106 mEq/L (ref 96–112)
Glucose, Bld: 102 mg/dL — ABNORMAL HIGH (ref 70–99)
Sodium: 141 mEq/L (ref 135–145)
Total Bilirubin: 0.6 mg/dL (ref 0.3–1.2)
Total Protein: 7.6 g/dL (ref 6.0–8.3)

## 2011-11-16 LAB — CBC WITH DIFFERENTIAL/PLATELET
Basophils Absolute: 0 10*3/uL (ref 0.0–0.1)
Eosinophils Absolute: 0.1 10*3/uL (ref 0.0–0.5)
HGB: 12.4 g/dL (ref 11.6–15.9)
LYMPH%: 32.8 % (ref 14.0–49.7)
MCV: 74.7 fL — ABNORMAL LOW (ref 79.5–101.0)
MONO#: 0.5 10*3/uL (ref 0.1–0.9)
MONO%: 5.7 % (ref 0.0–14.0)
NEUT#: 5 10*3/uL (ref 1.5–6.5)
Platelets: 292 10*3/uL (ref 145–400)
RDW: 16.1 % — ABNORMAL HIGH (ref 11.2–14.5)
WBC: 8.5 10*3/uL (ref 3.9–10.3)
nRBC: 0 % (ref 0–0)

## 2011-11-16 LAB — LACTATE DEHYDROGENASE: LDH: 185 U/L (ref 94–250)

## 2011-11-16 LAB — PROTIME-INR

## 2011-11-16 LAB — CANCER ANTIGEN 27.29: CA 27.29: 31 U/mL (ref 0–39)

## 2011-11-16 MED ORDER — SODIUM CHLORIDE 0.9 % IJ SOLN
10.0000 mL | INTRAMUSCULAR | Status: DC | PRN
  Filled 2011-11-16: qty 10

## 2011-11-16 MED ORDER — SODIUM CHLORIDE 0.9 % IV SOLN
Freq: Once | INTRAVENOUS | Status: AC
  Administered 2011-11-16: 10:00:00 via INTRAVENOUS

## 2011-11-16 MED ORDER — ACETAMINOPHEN 325 MG PO TABS
650.0000 mg | ORAL_TABLET | Freq: Once | ORAL | Status: AC
  Administered 2011-11-16: 650 mg via ORAL

## 2011-11-16 MED ORDER — DIPHENHYDRAMINE HCL 25 MG PO CAPS
50.0000 mg | ORAL_CAPSULE | Freq: Once | ORAL | Status: AC
  Administered 2011-11-16: 50 mg via ORAL

## 2011-11-16 MED ORDER — LORAZEPAM 2 MG/ML IJ SOLN
1.0000 mg | Freq: Once | INTRAMUSCULAR | Status: AC
  Administered 2011-11-16: 1 mg via INTRAVENOUS

## 2011-11-16 MED ORDER — TRASTUZUMAB CHEMO INJECTION 440 MG
6.0000 mg/kg | Freq: Once | INTRAVENOUS | Status: AC
  Administered 2011-11-16: 714 mg via INTRAVENOUS
  Filled 2011-11-16: qty 34

## 2011-11-16 MED ORDER — HEPARIN SOD (PORK) LOCK FLUSH 100 UNIT/ML IV SOLN
500.0000 [IU] | Freq: Once | INTRAVENOUS | Status: DC | PRN
  Filled 2011-11-16: qty 5

## 2011-11-16 NOTE — Progress Notes (Signed)
Hematology and Oncology Follow Up Visit  [name] [id number] 1951-02-19 61 y.o. 11/16/2011    HPI: A 61 year old [name], [place], woman with a history of metastatic HER-2 positive breast carcinoma originally diagnosed September 2004, on maintenance q.3-week Herceptin, due for next dosing today. 2. 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  3. History of chemotherapy-induced neuropathy. Has been on Neurontin  600 mg p.o. t.i.d. in the past.  4. History of hypertension, managed by Dr. Sudie Bailey.    Interim History:   [name] is seen today with her husband in accompaniment for followup prior to her next every 2 week dose of maintenance Herceptin. She is feeling better from her prior bronchitis, she completed azithromycin Z-Pak. She denies any current fevers, shortness of breath or chest pain issues per se. She does have occasional wheezing but has an albuterol inhaler which she uses effectively. Physically she is doing okay, mostly she is a very fragile today, she has had 2 deaths of friends within the past 7 days. She is processing this. She does wish to proceed with treatment today as scheduled. Of note, she has restaging PET and CT scheduled for 11/19/2011.  Review of Systems: Constitutional:  no weight loss, fever, night sweats and feels well Eyes: uses glasses ENT: No complaints Cardiovascular: no chest pain or dyspnea on exertion Respiratory: no cough, shortness of breath, she does have evidence of wheezing. Neurological: no TIA or stroke symptoms Dermatological: negative Gastrointestinal: no abdominal pain, change in bowel habits, or black or bloody stools Genito-Urinary: no dysuria, trouble voiding, or hematuria Hematological and Lymphatic: negative Breast: negative Musculoskeletal: positive for - joint pain, chronic B knees Remaining ROS negative.   Medications:   I have reviewed the patient's current medications.  Current Outpatient  Prescriptions  Medication Sig Dispense Refill  . albuterol-ipratropium (COMBIVENT) 18-103 MCG/ACT inhaler Inhale 2 puffs into the lungs every 6 (six) hours as needed for wheezing.  1 Inhaler  0  . ALPRAZolam (XANAX) 1 MG tablet Take 1-2 tablets (1-2 mg total) by mouth 3 (three) times daily as needed. anxiety  90 tablet  3  . AmLODIPine Besylate (NORVASC PO) Take 20 mg by mouth every morning.       . cyclobenzaprine (FLEXERIL) 10 MG tablet TAKE 1 TABLET BY MOUTH 3 TIMES A DAY AS NEEDED FOR MUSCLE SPASM  90 tablet  0  . Eszopiclone (ESZOPICLONE) 3 MG TABS Take 1 tablet (3 mg total) by mouth at bedtime. sleep  30 tablet  0  . Eszopiclone 3 MG TABS Take 1 tablet (3 mg total) by mouth at bedtime. Take immediately before bedtime  30 tablet  0  . furosemide (LASIX) 80 MG tablet Take 40 mg by mouth every morning.       . gabapentin (NEURONTIN) 600 MG tablet Take 600 mg by mouth 3 (three) times daily.        Marland Kitchen losartan (COZAAR) 100 MG tablet Take 100 mg by mouth every morning.       Marland Kitchen oxyCODONE (OXY IR/ROXICODONE) 5 MG immediate release tablet Take 1 tablet (5 mg total) by mouth every 4 (four) hours as needed for pain.  100 tablet  0  . Potassium Chloride (K-TABS PO) Take 40 mEq by mouth daily.       . prochlorperazine (COMPAZINE) 10 MG tablet Take 10 mg by mouth every 6 (six) hours as needed. nausea      . venlafaxine (EFFEXOR-XR) 37.5 MG 24 hr capsule Take 37.5 mg by  mouth every evening. depression      . warfarin (COUMADIN) 2 MG tablet Take 2 mg by mouth every evening. Take 2 mg with 5 mg daily to total 7 mg      . warfarin (COUMADIN) 5 MG tablet TAKE 1 TABLET BY MOUTH EVERY DAY AS DIRECTED  30 tablet  2    Allergies:  Allergies  Allergen Reactions  . Adhesive (Tape) Other (See Comments)    Tears skin   . Penicillins Hives    Physical Exam: Filed Vitals:   11/16/11 0841  BP: 140/102  Pulse: 83  Temp: 98.7 F (37.1 C)  Weight: 265 lbs. HEENT:  Sclerae anicteric, conjunctivae pink.   Oropharynx clear.  No mucositis or candidiasis.   Nodes:  No cervical, supraclavicular, or axillary lymphadenopathy palpated.  Lungs:  Clear to auscultation bilaterally.  No crackles, rhonchi, or wheezes.   Heart:  Regular rate and rhythm.    Lab Results: Lab Results  Component Value Date   WBC 8.5 11/16/2011   HGB 12.4 11/16/2011   HCT 35.8 11/16/2011   MCV 74.7* 11/16/2011   PLT 292 11/16/2011   NEUTROABS 5.0 11/16/2011     Chemistry      Component Value Date/Time   NA 142 10/26/2011 0817   K 3.6 10/26/2011 0817   CL 104 10/26/2011 0817   CO2 28 10/26/2011 0817   BUN 19 10/26/2011 0817   CREATININE 1.08 10/26/2011 0817      Component Value Date/Time   CALCIUM 8.6 10/26/2011 0817   ALKPHOS 92 10/04/2011 0805   AST 11 10/04/2011 0805   ALT 9 10/04/2011 0805   BILITOT 0.4 10/04/2011 0805     Echocardiogram: 10/24/11 Study Conclusions - Left ventricle: The cavity size was normal. Wall thickness was normal. Systolic function was normal. The estimated ejection fraction was in the range of 60% to 65%. Regional wall motion abnormalities cannot be excluded. - Mitral valve: Mild regurgitation. - Line: A venous catheter was visualized in the right atrium. Transthoracic echocardiography. M-mode, complete 2D, spectral Doppler, and color Doppler. Height: Height: 157.5cm. Height: 62in. Weight: Weight: 118.2kg. Weight: 260lb. Body mass index: BMI: 47.7kg/m^2. Body surface area: BSA: 2.66m^2. Patient status: Outpatient. Location: Echo laboratory. Prepared and Electronically Authenticated by Olga Millers, MD, [facility]    Assessment:  A 61 year old [place], [place], woman with a history of metastatic HER-2 positive breast carcinoma originally diagnosed September 2004, on maintenance q.3-week Herceptin.  2. 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  3. History of chemotherapy-induced neuropathy. Has been on Neurontin  600 mg p.o. t.i.d. in the past.  4. History of hypertension,  managed by Dr. Sudie Bailey.    Case reviewed with Dr. Pierce Crane.  Plan:  [name] will receive Herceptin today as scheduled. As noted, she is scheduled for PET and CT scans on 11/19/2011, to keep these appointments as scheduled. I will see her then in 3 weeks' time prior to her next Herceptin dosing she and her husband understand and agree with this plan.  This plan was reviewed with the patient, who voices understanding and agreement.  She knows to call with any changes or problems.    Oluwafemi Villella T, PA-C 11/16/2011

## 2011-11-19 ENCOUNTER — Encounter (HOSPITAL_COMMUNITY)
Admission: RE | Admit: 2011-11-19 | Discharge: 2011-11-19 | Disposition: A | Discharge status: Home / Self Care | Payer: Medicare Other | Source: Ambulatory Visit | Attending: Physician Assistant | Admitting: Physician Assistant

## 2011-11-19 ENCOUNTER — Encounter (HOSPITAL_COMMUNITY): Payer: Self-pay

## 2011-11-19 DIAGNOSIS — Z9089 Acquired absence of other organs: Secondary | ICD-10-CM | POA: Insufficient documentation

## 2011-11-19 DIAGNOSIS — R911 Solitary pulmonary nodule: Secondary | ICD-10-CM | POA: Insufficient documentation

## 2011-11-19 DIAGNOSIS — C50919 Malignant neoplasm of unspecified site of unspecified female breast: Secondary | ICD-10-CM

## 2011-11-19 DIAGNOSIS — K5730 Diverticulosis of large intestine without perforation or abscess without bleeding: Secondary | ICD-10-CM | POA: Insufficient documentation

## 2011-11-19 LAB — GLUCOSE, CAPILLARY: Glucose-Capillary: 99 mg/dL (ref 70–99)

## 2011-11-19 MED ORDER — FLUDEOXYGLUCOSE F - 18 (FDG) INJECTION
16.4000 | Freq: Once | INTRAVENOUS | Status: AC | PRN
  Administered 2011-11-19: 16.4 via INTRAVENOUS

## 2011-11-19 MED ORDER — IOHEXOL 300 MG/ML  SOLN
100.0000 mL | Freq: Once | INTRAMUSCULAR | Status: AC | PRN
  Administered 2011-11-19: 100 mL via INTRAVENOUS

## 2011-11-23 ENCOUNTER — Other Ambulatory Visit: Payer: Self-pay | Admitting: Physician Assistant

## 2011-11-23 DIAGNOSIS — C50919 Malignant neoplasm of unspecified site of unspecified female breast: Secondary | ICD-10-CM

## 2011-12-07 ENCOUNTER — Other Ambulatory Visit: Payer: Medicare Other | Admitting: Lab

## 2011-12-07 ENCOUNTER — Telehealth: Payer: Self-pay | Admitting: *Deleted

## 2011-12-07 ENCOUNTER — Ambulatory Visit (HOSPITAL_BASED_OUTPATIENT_CLINIC_OR_DEPARTMENT_OTHER): Payer: Medicare Other | Admitting: Physician Assistant

## 2011-12-07 ENCOUNTER — Ambulatory Visit (HOSPITAL_BASED_OUTPATIENT_CLINIC_OR_DEPARTMENT_OTHER): Payer: Medicare Other

## 2011-12-07 VITALS — BP 121/79 | HR 83 | Temp 98.4°F | Ht 63.0 in | Wt 273.0 lb

## 2011-12-07 DIAGNOSIS — C801 Malignant (primary) neoplasm, unspecified: Secondary | ICD-10-CM

## 2011-12-07 DIAGNOSIS — C50919 Malignant neoplasm of unspecified site of unspecified female breast: Secondary | ICD-10-CM

## 2011-12-07 DIAGNOSIS — R911 Solitary pulmonary nodule: Secondary | ICD-10-CM

## 2011-12-07 DIAGNOSIS — C50219 Malignant neoplasm of upper-inner quadrant of unspecified female breast: Secondary | ICD-10-CM

## 2011-12-07 DIAGNOSIS — D6481 Anemia due to antineoplastic chemotherapy: Secondary | ICD-10-CM

## 2011-12-07 DIAGNOSIS — Z5112 Encounter for antineoplastic immunotherapy: Secondary | ICD-10-CM

## 2011-12-07 MED ORDER — HEPARIN SOD (PORK) LOCK FLUSH 100 UNIT/ML IV SOLN
500.0000 [IU] | Freq: Once | INTRAVENOUS | Status: AC | PRN
  Administered 2011-12-07: 250 [IU]
  Filled 2011-12-07: qty 5

## 2011-12-07 MED ORDER — SODIUM CHLORIDE 0.9 % IJ SOLN
10.0000 mL | INTRAMUSCULAR | Status: DC | PRN
  Administered 2011-12-07: 10 mL
  Filled 2011-12-07: qty 10

## 2011-12-07 MED ORDER — ACETAMINOPHEN 325 MG PO TABS
650.0000 mg | ORAL_TABLET | Freq: Once | ORAL | Status: AC
  Administered 2011-12-07: 650 mg via ORAL

## 2011-12-07 MED ORDER — DIPHENHYDRAMINE HCL 25 MG PO CAPS
50.0000 mg | ORAL_CAPSULE | Freq: Once | ORAL | Status: AC
  Administered 2011-12-07: 50 mg via ORAL

## 2011-12-07 MED ORDER — TRASTUZUMAB CHEMO INJECTION 440 MG
6.0000 mg/kg | Freq: Once | INTRAVENOUS | Status: AC
  Administered 2011-12-07: 714 mg via INTRAVENOUS
  Filled 2011-12-07: qty 34

## 2011-12-07 MED ORDER — SODIUM CHLORIDE 0.9 % IV SOLN
Freq: Once | INTRAVENOUS | Status: AC
  Administered 2011-12-07: 10:00:00 via INTRAVENOUS

## 2011-12-07 MED ORDER — LORAZEPAM 2 MG/ML IJ SOLN
1.0000 mg | Freq: Once | INTRAMUSCULAR | Status: AC
  Administered 2011-12-07: 1 mg via INTRAVENOUS

## 2011-12-07 NOTE — Telephone Encounter (Signed)
gave patient appointment for 05-03 05-24 06-14 07-05 05-23 echo gave patient appointment with dr.wentworth printed out calendar and gave to the patient in the chemo room

## 2011-12-07 NOTE — Patient Instructions (Signed)
[facility] Discharge Instructions for Patients Receiving Chemotherapy  Today you received the following chemotherapy agents; Herceptin To help prevent nausea and vomiting after your treatment, we encourage you to take your nausea medication per your MD instructions. Begin taking it as prescribed.   If you develop nausea and vomiting that is not controlled by your nausea medication, call the clinic. If it is after clinic hours your family physician or the after hours number for the clinic or go to the Emergency Department.   BELOW ARE SYMPTOMS THAT SHOULD BE REPORTED IMMEDIATELY:  *FEVER GREATER THAN 100.5 F  *CHILLS WITH OR WITHOUT FEVER  NAUSEA AND VOMITING THAT IS NOT CONTROLLED WITH YOUR NAUSEA MEDICATION  *UNUSUAL SHORTNESS OF BREATH  *UNUSUAL BRUISING OR BLEEDING  TENDERNESS IN MOUTH AND THROAT WITH OR WITHOUT PRESENCE OF ULCERS  *URINARY PROBLEMS  *BOWEL PROBLEMS  UNUSUAL RASH Items with * indicate a potential emergency and should be followed up as soon as possible.  One of the nurses will contact you 24 hours after your treatment. Please let the nurse know about any problems that you may have experienced. Feel free to call the clinic you have any questions or concerns. The clinic phone number is [phone].   I have been informed and understand all the instructions given to me. I know to contact the clinic, my physician, or go to the Emergency Department if any problems should occur. I do not have any questions at this time, but understand that I may call the clinic during office hours   should I have any questions or need assistance in obtaining follow up care.    __________________________________________  _____________  __________ Signature of Patient or Authorized Representative            Date                   Time    __________________________________________ Nurse's Signature

## 2011-12-10 ENCOUNTER — Ambulatory Visit: Payer: Medicare Other | Admitting: Radiation Oncology

## 2011-12-10 ENCOUNTER — Ambulatory Visit: Payer: Medicare Other

## 2011-12-10 NOTE — Progress Notes (Signed)
Hematology and Oncology Follow Up Visit  [name] [id number] [birth year] 61 y.o. 12/07/11    HPI: A 61 year old [place], [place], woman with a history of metastatic HER-2 positive breast carcinoma originally diagnosed September 2004, on maintenance q.3-week Herceptin, due for next dosing today. Recent restaging studies. 2. 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  3. History of chemotherapy-induced neuropathy. Has been on Neurontin 600 mg p.o. t.i.d. in the past.  4. History of hypertension, managed by Dr. Sudie Bailey.    Interim History:   [name] is seen today with her husband in accompaniment for followup prior to her next every 3 week dose of maintenance Herceptin. And to go over her most recent restaging studies specifically, CT of the chest abdomen and pelvis and PET scan. She denies any new complaints, she still has ongoing issues with pain, but utilizes MS Contin appropriately. She also has history of chemotherapy-induced neuropathy and utilizes Neurontin 600 mg by mouth 3 times a day. She denies any fevers, chills, or night sweats. No increased shortness of breath or unusual episodes of chest pain. Her appetite has been fine, no persistent issues with nausea, emesis diarrhea or constipation.  Review of systems: Constitutional:  no weight loss, fever, night sweats and feels well Eyes: uses glasses ENT: No complaints Cardiovascular: no chest pain or dyspnea on exertion Respiratory: no cough, shortness of breath, she does have evidence of wheezing. Neurological: no TIA or stroke symptoms Dermatological: negative Gastrointestinal: no abdominal pain, change in bowel habits, or black or bloody stools Genito-Urinary: no dysuria, trouble voiding, or hematuria Hematological and Lymphatic: negative Breast: negative Musculoskeletal: positive for - joint pain, chronic B knees Remaining ROS negative.   Medications:   I have reviewed the patient's current  medications.  Current Outpatient Prescriptions  Medication Sig Dispense Refill  . albuterol-ipratropium (COMBIVENT) 18-103 MCG/ACT inhaler Inhale 2 puffs into the lungs every 6 (six) hours as needed for wheezing.  1 Inhaler  0  . ALPRAZolam (XANAX) 1 MG tablet Take 1-2 tablets (1-2 mg total) by mouth 3 (three) times daily as needed. anxiety  90 tablet  3  . AmLODIPine Besylate (NORVASC PO) Take 20 mg by mouth every morning.       . cyclobenzaprine (FLEXERIL) 10 MG tablet TAKE 1 TABLET BY MOUTH 3 TIMES A DAY AS NEEDED FOR MUSCLE SPASM  90 tablet  0  . Eszopiclone (ESZOPICLONE) 3 MG TABS Take 1 tablet (3 mg total) by mouth at bedtime. sleep  30 tablet  0  . Eszopiclone 3 MG TABS Take 1 tablet (3 mg total) by mouth at bedtime. Take immediately before bedtime  30 tablet  0  . furosemide (LASIX) 80 MG tablet Take 40 mg by mouth every morning.       . gabapentin (NEURONTIN) 300 MG capsule TAKE 2 CAPSULES BY MOUTH 3 TIMES A DAY  180 capsule  0  . gabapentin (NEURONTIN) 600 MG tablet Take 600 mg by mouth 3 (three) times daily.        Marland Kitchen losartan (COZAAR) 100 MG tablet Take 100 mg by mouth every morning.       Marland Kitchen oxyCODONE (OXY IR/ROXICODONE) 5 MG immediate release tablet Take 1 tablet (5 mg total) by mouth every 4 (four) hours as needed for pain.  100 tablet  0  . Potassium Chloride (K-TABS PO) Take 40 mEq by mouth daily.       . prochlorperazine (COMPAZINE) 10 MG tablet Take 10 mg by mouth every 6 (  six) hours as needed. nausea      . venlafaxine (EFFEXOR-XR) 37.5 MG 24 hr capsule Take 37.5 mg by mouth every evening. depression      . warfarin (COUMADIN) 2 MG tablet TAKE 1 TABLET BY MOUTH EVERY DAY AT 4 PM  30 tablet  0  . warfarin (COUMADIN) 5 MG tablet TAKE 1 TABLET BY MOUTH EVERY DAY AS DIRECTED  30 tablet  2    Allergies:  Allergies  Allergen Reactions  . Adhesive (Tape) Other (See Comments)    Tears skin   . Penicillins Hives    Physical Exam: Filed Vitals:   12/07/11 0841  BP: 121/79    Pulse: 83  Temp: 98.4 F (36.9 C)  Weight: 273 lbs. HEENT:  Sclerae anicteric, conjunctivae pink.  Oropharynx clear.  No mucositis or candidiasis.   Nodes:  No cervical, supraclavicular, or axillary lymphadenopathy palpated.  Lungs:  Clear to auscultation bilaterally.  No crackles, rhonchi, or wheezes.   Heart:  Regular rate and rhythm.    Lab Results: Lab Results  Component Value Date   WBC 8.5 11/16/2011   HGB 12.4 11/16/2011   HCT 35.8 11/16/2011   MCV 74.7* 11/16/2011   PLT 292 11/16/2011   NEUTROABS 5.0 11/16/2011     Chemistry      Component Value Date/Time   NA 141 11/16/2011 0815   K 3.5 11/16/2011 0815   CL 106 11/16/2011 0815   CO2 24 11/16/2011 0815   BUN 11 11/16/2011 0815   CREATININE 0.71 11/16/2011 0815      Component Value Date/Time   CALCIUM 9.1 11/16/2011 0815   ALKPHOS 85 11/16/2011 0815   AST 14 11/16/2011 0815   ALT 9 11/16/2011 0815   BILITOT 0.6 11/16/2011 0815     Echocardiogram: 10/24/11 Study Conclusions - Left ventricle: The cavity size was normal. Wall thickness was normal. Systolic function was normal. The estimated ejection fraction was in the range of 60% to 65%. Regional wall motion abnormalities cannot be excluded. - Mitral valve: Mild regurgitation. - Line: A venous catheter was visualized in the right atrium. Transthoracic echocardiography. M-mode, complete 2D, spectral Doppler, and color Doppler. Height: Height: 157.5cm. Height: 62in. Weight: Weight: 118.2kg. Weight: 260lb. Body mass index: BMI: 47.7kg/m^2. Body surface area: BSA: 2.67m^2. Patient status: Outpatient. Location: Echo laboratory. Prepared and Electronically Authenticated by Olga Millers, MD, [facility]  Radiographic data: 11/19/11  NUCLEAR MEDICINE PET CT RESTAGING (PS) SKULL BASE TO THIGH  Technique: 16.4 mCi F-18 FDG was injected intravenously via the  right AC. Full-ring PET imaging was performed from the skull base  through the mid-thighs 70 minutes after injection. CT data  was  obtained and used for attenuation correction and anatomic  localization only. (This was not acquired as a diagnostic CT  examination.)  Fasting Blood Glucose: 99  Patient Weight: 265 pounds.  Comparison: PET CT 09/01/2010.  Findings:  Skull Base and Neck: Within the region of the palatine tonsils  bilaterally there is hypermetabolic activity (SUVmax = 9.3 on the  left and 11 on the right). No additional focal hypermetabolic  activity noted within the neck. No focal soft tissue masses or  significant lymphadenopathy.  Thorax: Bilateral modified radical mastectomies and axillary nodal  dissections again noted. Within the manubrium and the sternum  centered around the manubriosternal joint there are areas of  increasing sclerosis, with new hypermetabolic activity (SUVmax =  five). Lower down in the sternum there is no definite focal bony  lesion identified on CT  images (image 76 of series 2), however,  there is a small focus of hypermetabolic activity (SUVmax = 4.1),  concerning for a bony metastasis. In the lateral aspect of the left  breast there is a small postoperative fluid collection which is  similar in size to the prior examination (approximately 4.1 x 1.3  cm), which demonstrates a small amount of hypermetabolic activity  along its superior margin (SUVmax = 4.1). Within the superolateral  aspect of the left pectoralis major muscle there is a large focus  of increased metabolic activity (SUVmax = 5.1)however, there is no  definite corresponding focal soft tissue mass identified on the  noncontrast portion of the CT images, and this region appears to  have previously had a Port-A-Cath catheter traversing the muscle.  The previously noted left-sided Port-A-Cath has been removed, there  has been interval placement of a right internal jugular infusion  catheter with tip terminating in the right atrium. There is also  some increased metabolic activity within the low right  paraspinal  musculature (SUVmax = 5.8), without a definite focal soft tissue  mass. Compared to the prior examination, the previously noted air  space disease in the right upper lobe has resolved. Although  evaluation of the lung parenchyma is slightly limited by  respiratory motion there appears to be an new 8 mm nodule in the  left lower lobe (image 85 of series 2), which does not demonstrate  definite hypermetabolic activity on the PET portion of the  examination. Heart is moderately enlarged. No definite  mediastinal or hilar adenopathy.  Abdomen/Pelvis: No abnormal hypermetabolic activity. The uninfused  appearance of the liver, pancreas, spleen, bilateral adrenal glands  and bilateral kidneys is unremarkable. The patient is status post  cholecystectomy. No abnormal soft tissue masses or  lymphadenopathy. No ascites or pneumoperitoneum, and no pathologic  distension of bowel. Numerous colonic diverticula, without  surrounding inflammatory changes to suggest acute diverticulitis at  this time. The uterus, left ovary and urinary bladder are  unremarkable in appearance. Within the right ovary there is a  partially calcified lesion which is nonspecific in appearance,  unchanged compared to the prior study, and does not exhibit  hypermetabolic activity, presumably a benign finding.  Upper Thighs: No abnormal hypermetabolic activity. No abnormal  soft tissue masses or lymphadenopathy.  IMPRESSION:  1. Status post bilateral modified radical mastectomy and axillary  nodal dissection with a small postoperative fluid collection in the  lateral aspect of the left breast which appears similar in size  compared the prior examination, however, this is now demonstrating  some mildly increased metabolic activity along its superior margin  (SUVmax = 4.1). In addition, there are new foci of hypermetabolic  activity within the manubrium of the sternum, as above, concerning  for metastatic disease.    2. There is also new hypermetabolic activity in the superior  lateral aspect of the left pectoralis major muscle. However, there  is no definite focal soft tissue mass in this region, and this area  of hypermetabolic activity appears to correspond with the tract of  the previously noted left-sided Port-A-Cath. This could simply  represents increased metabolic activity from normal muscular  healing, however, attention on follow-up studies is recommended.  3. New 8 mm nodule in the left lower lobe. While this nodule does  not demonstrate definite hypermetabolic activity, this may be below  the discriminatory size for PET. Attention on follow-up is  recommended.  4. Increased activity within the palatine tonsils bilaterally,  favored to be of infectious or inflammatory etiology. Clinical  correlation is recommended.  5. Increased metabolic activity within the right paraspinal  musculature in the lower thoracic region, without corresponding  soft tissue mass. This is presumably physiologic.  6. Colonic diverticulosis without findings to suggest acute  diverticulitis.  7. Status post cholecystectomy.  Original Report Authenticated By: Florencia Reasons, M.D.     CT CHEST, ABDOMEN AND PELVIS WITH CONTRAST  Technique: Multidetector CT imaging of the chest, abdomen and  pelvis was performed following the standard protocol during bolus  administration of intravenous contrast.  Contrast: 100 ml of Omnipaque-300.  Comparison: PET CT 09/01/2010. Chest abdomen and pelvis CT  03/08/2011.  CT CHEST  Findings:  Mediastinum: Heart size is mildly enlarged. There is no significant  pericardial fluid, thickening or pericardial calcification. No  pathologically enlarged mediastinal or hilar lymph nodes. New right  internal jugular central venous catheter with tip terminating in  the right atrium. Previously noted left-sided Port-A-Cath has been  removed. Esophagus is unremarkable in appearance.   Lungs/Pleura: Image 31 of series 4 demonstrates a new 8 mm nodule  in the left lower lobe. No other suspicious appearing pulmonary  nodules or masses are otherwise identified. No consolidative  airspace disease. No pleural effusions. Minimal subpleural  reticulation is noted in the anterolateral aspect of the right  upper lobe, similar to prior study from 03/08/2011.  Musculoskeletal: Postoperative changes of bilateral modified  radical mastectomy and axillary nodal dissection are again noted.  In the lateral aspect of the left breast parenchyma inferior and  lateral to the left pectoralis major muscle there is a small  postoperative fluid collection with a thick rim of soft tissue  which measures approximately 4.1 x 1.3 cm. This is similar in size  to the prior examination. Within the manubrium and sternum  centered around the manubrial sternal joint there are new areas of  sclerosis, which correspond with regions of hypermetabolic activity  on the PET portion of the examination), concerning for skeletal  metastasis. No other definite aggressive appearing lytic or  blastic bony lesion is identified within the visualized portions of  the skeleton.  IMPRESSION:  1. Interval development of a new ill-defined 8 mm left lower lobe  pulmonary nodule, which is nonspecific in appearance, but warrants  close attention on short-term followup study in 3 months, as  metastatic disease is not excluded.  2. Interval development of the expansion and sclerosis of the  inferior aspect of the manubrium and superior aspect of the sternum  centered around the manubriosternal joint, with corresponding  hypermetabolic activity on contemporaneous PET examination,  concerning for a new skeletal metastasis.  3. Although the postoperative fluid collection in the lateral  aspect of the left breast is similar in size and appearance on the  CT portion of today's examination, there was hypermetabolic  activity  within the superior lateral aspect of this collection on  the contemporaneous PET examination, which could suggest recurrent  disease. Continued attention on follow-up studies is recommended.  CT ABDOMEN AND PELVIS  Findings:  Abdomen/Pelvis: Status post cholecystectomy. Minimal intrahepatic  biliary ductal dilatation (unchanged). No extrahepatic biliary  ductal dilatation. No focal hepatic lesions are identified. The  enhanced appearance of the pancreas, spleen, bilateral adrenal  glands and bilateral kidneys is unremarkable.  There is no ascites or pneumoperitoneum and no pathologic  distension of bowel. No definite pathologic adenopathy identified  within the abdomen or pelvis. There are numerous colonic  diverticula,  predominantly in the sigmoid and descending colon,  without surrounding inflammatory changes to suggest acute  diverticulitis at this time. The uterus, left ovary and urinary  bladder are unremarkable in appearance. The right ovary appears  enlarged with two rim calcified lesions, which overall appear  unchanged in size and appearance compared to the prior examination  from 03/08/2011, presumably a benign finding.  Musculoskeletal: A well defined lucent lesion with sclerotic  margins and narrow zone of transition is noted in the anterior  aspect of the right femoral head, unchanged compared to prior  examination (with no corresponding hypermetabolic activity on the  contemporaneous PET examination), likely represent a large  subchondral cyst. There are no aggressive appearing lytic or  blastic lesions noted in the visualized portions of the skeleton.  IMPRESSION:  1. No findings to suggest the presence of metastatic disease in  the abdomen or pelvis on today's examination.  2. Status post cholecystectomy.  3. Colonic diverticulosis without findings to suggest acute  diverticulitis.  Original Report Authenticated By: Florencia Reasons, M.D.    Assessment:  A  61 year old [place], [place], woman with a history of metastatic HER-2 positive breast carcinoma originally diagnosed September 2004, on maintenance q.3-week Herceptin.  2. 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  3. History of chemotherapy-induced neuropathy. Has been on Neurontin  600 mg p.o. t.i.d. in the past.  4. History of hypertension, managed by Dr. Sudie Bailey.  5. Evidence to suggest sternal/manubrial osseous metastatic deposit.  Case reviewed with Dr. Pierce Crane, who also spoke with [name] and her husband [name].   Plan:  [name] will receive Herceptin today as scheduled.we will refer her to Dr. Lurline Hare for consideration of radiation therapy to the manubrial met.  She will return on 12/28/2011 for labs and Herceptin alone, and on   01/18/2012 for labs, followup exam and Herceptin dosing.  This plan was reviewed with the patient, who voices understanding and agreement.  She knows to call with any changes or problems.    Frederich Montilla T, PA-C 12/07/11

## 2011-12-12 ENCOUNTER — Ambulatory Visit
Admission: RE | Admit: 2011-12-12 | Discharge: 2011-12-12 | Disposition: A | Discharge status: Home / Self Care | Payer: Medicare Other | Source: Ambulatory Visit | Attending: Radiation Oncology | Admitting: Radiation Oncology

## 2011-12-12 ENCOUNTER — Encounter: Payer: Self-pay | Admitting: Radiation Oncology

## 2011-12-12 VITALS — BP 131/91 | HR 81 | Temp 97.3°F | Wt 279.5 lb

## 2011-12-12 DIAGNOSIS — M948X9 Other specified disorders of cartilage, unspecified sites: Secondary | ICD-10-CM | POA: Insufficient documentation

## 2011-12-12 DIAGNOSIS — R911 Solitary pulmonary nodule: Secondary | ICD-10-CM | POA: Insufficient documentation

## 2011-12-12 DIAGNOSIS — C7951 Secondary malignant neoplasm of bone: Secondary | ICD-10-CM

## 2011-12-12 DIAGNOSIS — C50919 Malignant neoplasm of unspecified site of unspecified female breast: Secondary | ICD-10-CM | POA: Insufficient documentation

## 2011-12-12 HISTORY — DX: Personal history of antineoplastic chemotherapy: Z92.21

## 2011-12-12 HISTORY — DX: Reserved for concepts with insufficient information to code with codable children: IMO0002

## 2011-12-12 NOTE — Progress Notes (Signed)
Please see the Nurse Progress Note in the MD Initial Consult Encounter for this patient. 

## 2011-12-12 NOTE — Progress Notes (Signed)
CC:   [name], M.D., F.R.C.P.C. [name], M.D.  REFERRING PHYSICIAN:  Pierce Davis, M.D., F.R.C.P.C.  DIAGNOSIS:  Metastatic breast cancer.  HISTORY OF PRESENT ILLNESS:  [name] is a very pleasant 61 year old female who is seen out of the courtesy of Dr. Donnie Coffin for consideration for additional radiation therapy as part of the management of the patient's progressive breast cancer.  [name] has had bilateral mastectomies in the past.  She, in addition, has received chest wall radiation to both areas under the direction of Dr. Lestine Box.  The patient, in addition, did develop a recurrence and was treated with electron therapy involving recurrences along the chest wall region.  The patient's metastatic breast cancer is HER-2/neu positive and, in light of this, the patient has been on every-3-week Herceptin.  In late March, the patient did undergo routine staging to assess her response to chemotherapy as above.  On the patient's CT scan of the chest, abdomen and pelvis, the patient was noted to have a small 8 mm left lower lobe pulmonary nodule which was new but was nonspecific.  In addition, there was some expansion and sclerosis of the inferior aspect of the manubrium and superior aspect of the sternum.  In addition, the patient was noted to have some postoperative fluid collections along the left lateral chest region.  The patient's PET scan did show new hypermetabolic activity along the areas of sclerosis along the manubriosternal joints. In addition, lower down in the sternum there was a small area of uptake on PET scan but no CT scan correlates.  In addition, the patient was noted to have some uptake in the small area of postoperative fluid collection along the left lateral chest region.  In addition, there was some uptake along the superior lateral aspect of the left pectoral muscle without any CT correlate.  Given the above findings along the sternum area,  Radiation Therapy has been consulted for consideration for additional treatments.  REVIEW OF SYSTEMS:  The patient denies any pain along the upper sternum area at this time.  She has had some discomfort along the right upper chest where her most recent venous access device was placed.  Patient denies any other areas of pain, headaches, dizziness or blurred vision. The patient denies any focal motor weakness.  PHYSICAL EXAMINATION:  Vital signs:  The patient's temperature is 97.3, pulse 81, blood pressure is 131/91.  Weight is 279 pounds.  Examination of the neck and supraclavicular region reveals no evidence of adenopathy.  The axillary areas are free of adenopathy.  Examination of the lungs reveals them to be clear.  The heart has regular rhythm and rate.  Examination of the right chest area reveals a bandage in place along the upper chest region.  The patient has hyperpigmentation changes and significant induration all throughout the chest area without any obvious palpable or visible signs of recurrence.  Examination of the left chest area reveals hyperpigmentation changes and fibrosis without any discrete palpable or visible lesion.  Examination of the sternum reveals no obvious swelling.  Palpation along the sternal area reveals no palpable mass or areas of discomfort.  IMPRESSION AND PLAN:  Recurrent breast cancer.  As above, the patient has uptake in the region of the sternomanubrial joint.  The patient is asymptomatic as far as this activity is concerned.  Patient has had extensive radiation along the chest region with her prior tangential beams as well as retreatment.  Given the fact the patient is asymptomatic and  the prior history of radiation therapy to this region, I would be less hesitant in recommending radiation therapy for her management.  The patient's prior radiation fields will be summed to determine estimated dose to the upper sternum area.  If the total dose to  this area is minimal, then I may reconsider treatments to this region.  I discussed these issues with [name] and her husband, both of whom are in agreement with the above recommendations.    ______________________________ [name], Ph.D., M.D. JDK/MEDQ  D:  12/12/2011  T:  12/12/2011  Job:  [id number]

## 2011-12-20 ENCOUNTER — Ambulatory Visit: Payer: Medicare Other

## 2011-12-20 ENCOUNTER — Ambulatory Visit: Payer: Medicare Other | Admitting: Radiation Oncology

## 2011-12-28 ENCOUNTER — Other Ambulatory Visit: Payer: Self-pay | Admitting: *Deleted

## 2011-12-28 ENCOUNTER — Other Ambulatory Visit (HOSPITAL_BASED_OUTPATIENT_CLINIC_OR_DEPARTMENT_OTHER): Payer: Medicare Other | Admitting: Lab

## 2011-12-28 ENCOUNTER — Other Ambulatory Visit: Payer: Self-pay | Admitting: Physician Assistant

## 2011-12-28 ENCOUNTER — Ambulatory Visit (HOSPITAL_BASED_OUTPATIENT_CLINIC_OR_DEPARTMENT_OTHER): Payer: Medicare Other

## 2011-12-28 VITALS — BP 144/90 | HR 93 | Temp 98.2°F

## 2011-12-28 DIAGNOSIS — C50919 Malignant neoplasm of unspecified site of unspecified female breast: Secondary | ICD-10-CM

## 2011-12-28 DIAGNOSIS — C7951 Secondary malignant neoplasm of bone: Secondary | ICD-10-CM

## 2011-12-28 DIAGNOSIS — Z5181 Encounter for therapeutic drug level monitoring: Secondary | ICD-10-CM

## 2011-12-28 DIAGNOSIS — Z5112 Encounter for antineoplastic immunotherapy: Secondary | ICD-10-CM

## 2011-12-28 DIAGNOSIS — C7952 Secondary malignant neoplasm of bone marrow: Secondary | ICD-10-CM

## 2011-12-28 LAB — PROTIME-INR

## 2011-12-28 LAB — COMPREHENSIVE METABOLIC PANEL
ALT: 13 U/L (ref 0–35)
CO2: 24 mEq/L (ref 19–32)
Creatinine, Ser: 1.01 mg/dL (ref 0.50–1.10)
Glucose, Bld: 106 mg/dL — ABNORMAL HIGH (ref 70–99)
Total Bilirubin: 0.6 mg/dL (ref 0.3–1.2)

## 2011-12-28 LAB — CBC WITH DIFFERENTIAL/PLATELET
BASO%: 0.3 % (ref 0.0–2.0)
LYMPH%: 33.5 % (ref 14.0–49.7)
MCHC: 34.9 g/dL (ref 31.5–36.0)
MONO#: 0.8 10*3/uL (ref 0.1–0.9)
Platelets: 244 10*3/uL (ref 145–400)
RBC: 4.66 10*6/uL (ref 3.70–5.45)
WBC: 12 10*3/uL — ABNORMAL HIGH (ref 3.9–10.3)
nRBC: 0 % (ref 0–0)

## 2011-12-28 LAB — LACTATE DEHYDROGENASE: LDH: 189 U/L (ref 94–250)

## 2011-12-28 LAB — CANCER ANTIGEN 27.29: CA 27.29: 33 U/mL (ref 0–39)

## 2011-12-28 MED ORDER — ACETAMINOPHEN 325 MG PO TABS
650.0000 mg | ORAL_TABLET | Freq: Once | ORAL | Status: AC
  Administered 2011-12-28: 650 mg via ORAL

## 2011-12-28 MED ORDER — HEPARIN SOD (PORK) LOCK FLUSH 100 UNIT/ML IV SOLN
500.0000 [IU] | Freq: Once | INTRAVENOUS | Status: DC | PRN
  Filled 2011-12-28: qty 5

## 2011-12-28 MED ORDER — ALPRAZOLAM 1 MG PO TABS: 1.0000 mg | ORAL_TABLET | Freq: Three times a day (TID) | ORAL | Status: DC | PRN

## 2011-12-28 MED ORDER — TRASTUZUMAB CHEMO INJECTION 440 MG
6.0000 mg/kg | Freq: Once | INTRAVENOUS | Status: AC
  Administered 2011-12-28: 714 mg via INTRAVENOUS
  Filled 2011-12-28: qty 34

## 2011-12-28 MED ORDER — SODIUM CHLORIDE 0.9 % IJ SOLN
10.0000 mL | INTRAMUSCULAR | Status: DC | PRN
  Filled 2011-12-28: qty 10

## 2011-12-28 MED ORDER — DIPHENHYDRAMINE HCL 25 MG PO CAPS
50.0000 mg | ORAL_CAPSULE | Freq: Once | ORAL | Status: AC
  Administered 2011-12-28: 50 mg via ORAL

## 2011-12-28 MED ORDER — SODIUM CHLORIDE 0.9 % IV SOLN
Freq: Once | INTRAVENOUS | Status: AC
  Administered 2011-12-28: 09:00:00 via INTRAVENOUS

## 2011-12-31 ENCOUNTER — Other Ambulatory Visit: Payer: Self-pay | Admitting: Certified Registered Nurse Anesthetist

## 2011-12-31 ENCOUNTER — Encounter: Payer: Self-pay | Admitting: *Deleted

## 2011-12-31 NOTE — Progress Notes (Signed)
[facility] Psychosocial Distress Screening Clinical Social Work  Clinical Social Work was referred by distress screening protocol.  The patient scored a 7 on the Psychosocial Distress Thermometer which indicates moderate distress. Clinical Social Worker attempted to contact patient to assess for distress and other psychosocial needs. CSW left voicemail requesting patient to return call when convenient.   Clinical Social Worker follow up needed: no  If yes, follow up plan:  Kathrin Penner, MSW, [facility] Clinical Social Worker [facility] [phone]

## 2012-01-12 ENCOUNTER — Other Ambulatory Visit: Payer: Self-pay | Admitting: Oncology

## 2012-01-12 DIAGNOSIS — C50919 Malignant neoplasm of unspecified site of unspecified female breast: Secondary | ICD-10-CM

## 2012-01-17 ENCOUNTER — Ambulatory Visit (HOSPITAL_COMMUNITY)
Admission: RE | Admit: 2012-01-17 | Discharge: 2012-01-17 | Disposition: A | Discharge status: Home / Self Care | Payer: Medicare Other | Source: Ambulatory Visit | Attending: Oncology | Admitting: Oncology

## 2012-01-17 DIAGNOSIS — R0602 Shortness of breath: Secondary | ICD-10-CM

## 2012-01-17 DIAGNOSIS — C7951 Secondary malignant neoplasm of bone: Secondary | ICD-10-CM

## 2012-01-17 DIAGNOSIS — R609 Edema, unspecified: Secondary | ICD-10-CM | POA: Insufficient documentation

## 2012-01-17 DIAGNOSIS — C50919 Malignant neoplasm of unspecified site of unspecified female breast: Secondary | ICD-10-CM | POA: Insufficient documentation

## 2012-01-17 DIAGNOSIS — Z9010 Acquired absence of unspecified breast and nipple: Secondary | ICD-10-CM | POA: Insufficient documentation

## 2012-01-17 DIAGNOSIS — I10 Essential (primary) hypertension: Secondary | ICD-10-CM | POA: Insufficient documentation

## 2012-01-17 DIAGNOSIS — Z8249 Family history of ischemic heart disease and other diseases of the circulatory system: Secondary | ICD-10-CM | POA: Insufficient documentation

## 2012-01-17 NOTE — Progress Notes (Signed)
  Echocardiogram 2D Echocardiogram has been performed.  Cathie Beams Deneen 01/17/2012, 9:08 AM

## 2012-01-18 ENCOUNTER — Other Ambulatory Visit: Payer: Self-pay | Admitting: *Deleted

## 2012-01-18 ENCOUNTER — Ambulatory Visit (HOSPITAL_BASED_OUTPATIENT_CLINIC_OR_DEPARTMENT_OTHER): Payer: Medicare Other | Admitting: Oncology

## 2012-01-18 ENCOUNTER — Other Ambulatory Visit (HOSPITAL_BASED_OUTPATIENT_CLINIC_OR_DEPARTMENT_OTHER): Payer: Medicare Other | Admitting: Lab

## 2012-01-18 ENCOUNTER — Ambulatory Visit (HOSPITAL_BASED_OUTPATIENT_CLINIC_OR_DEPARTMENT_OTHER): Payer: Medicare Other

## 2012-01-18 VITALS — BP 130/88 | HR 72

## 2012-01-18 VITALS — BP 173/102 | HR 81 | Temp 98.3°F | Ht 63.0 in | Wt 282.0 lb

## 2012-01-18 DIAGNOSIS — C801 Malignant (primary) neoplasm, unspecified: Secondary | ICD-10-CM

## 2012-01-18 DIAGNOSIS — C50919 Malignant neoplasm of unspecified site of unspecified female breast: Secondary | ICD-10-CM

## 2012-01-18 DIAGNOSIS — Z7901 Long term (current) use of anticoagulants: Secondary | ICD-10-CM

## 2012-01-18 DIAGNOSIS — Z5112 Encounter for antineoplastic immunotherapy: Secondary | ICD-10-CM

## 2012-01-18 LAB — COMPREHENSIVE METABOLIC PANEL
ALT: 14 U/L (ref 0–35)
Albumin: 3.9 g/dL (ref 3.5–5.2)
Alkaline Phosphatase: 92 U/L (ref 39–117)
CO2: 27 mEq/L (ref 19–32)
Glucose, Bld: 103 mg/dL — ABNORMAL HIGH (ref 70–99)
Potassium: 4.1 mEq/L (ref 3.5–5.3)
Sodium: 140 mEq/L (ref 135–145)
Total Protein: 7.7 g/dL (ref 6.0–8.3)

## 2012-01-18 LAB — PROTIME-INR
INR: 2.1 (ref 2.00–3.50)
Protime: 25.2 Seconds — ABNORMAL HIGH (ref 10.6–13.4)

## 2012-01-18 LAB — CBC WITH DIFFERENTIAL/PLATELET
Basophils Absolute: 0 10*3/uL (ref 0.0–0.1)
Eosinophils Absolute: 0.3 10*3/uL (ref 0.0–0.5)
HGB: 12.1 g/dL (ref 11.6–15.9)
NEUT#: 5.1 10*3/uL (ref 1.5–6.5)
RDW: 16.7 % — ABNORMAL HIGH (ref 11.2–14.5)
WBC: 8.4 10*3/uL (ref 3.9–10.3)
lymph#: 2.6 10*3/uL (ref 0.9–3.3)

## 2012-01-18 MED ORDER — LORAZEPAM 2 MG/ML IJ SOLN
1.0000 mg | Freq: Once | INTRAMUSCULAR | Status: AC
  Administered 2012-01-18: 1 mg via INTRAVENOUS

## 2012-01-18 MED ORDER — HEPARIN SOD (PORK) LOCK FLUSH 100 UNIT/ML IV SOLN
250.0000 [IU] | Freq: Once | INTRAVENOUS | Status: AC | PRN
  Administered 2012-01-18: 250 [IU]
  Filled 2012-01-18: qty 5

## 2012-01-18 MED ORDER — TRASTUZUMAB CHEMO INJECTION 440 MG
6.0000 mg/kg | Freq: Once | INTRAVENOUS | Status: AC
  Administered 2012-01-18: 714 mg via INTRAVENOUS
  Filled 2012-01-18: qty 34

## 2012-01-18 MED ORDER — ACETAMINOPHEN 325 MG PO TABS
650.0000 mg | ORAL_TABLET | Freq: Once | ORAL | Status: AC
  Administered 2012-01-18: 650 mg via ORAL

## 2012-01-18 MED ORDER — ALPRAZOLAM 1 MG PO TABS: 1.0000 mg | ORAL_TABLET | Freq: Three times a day (TID) | ORAL | Status: DC | PRN

## 2012-01-18 MED ORDER — SODIUM CHLORIDE 0.9 % IJ SOLN
10.0000 mL | INTRAMUSCULAR | Status: DC | PRN
  Administered 2012-01-18: 10 mL
  Filled 2012-01-18: qty 10

## 2012-01-18 MED ORDER — DIPHENHYDRAMINE HCL 25 MG PO CAPS
50.0000 mg | ORAL_CAPSULE | Freq: Once | ORAL | Status: AC
  Administered 2012-01-18: 50 mg via ORAL

## 2012-01-18 MED ORDER — SODIUM CHLORIDE 0.9 % IV SOLN
Freq: Once | INTRAVENOUS | Status: AC
  Administered 2012-01-18: 10:00:00 via INTRAVENOUS

## 2012-01-18 NOTE — Progress Notes (Signed)
Hematology and Oncology Follow Up Visit  [name] [id number] 1950/12/27 61 y.o. 12/07/11    HPI: A 61 year old [name], [name], woman with a history of metastatic HER-2 positive breast carcinoma originally diagnosed September 2004, on maintenance q.3-week Herceptin, due for next dosing today. Recent restaging studies. 2. 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  3. History of chemotherapy-induced neuropathy. Has been on Neurontin 600 mg p.o. t.i.d. in the past.  4. History of hypertension, managed by Dr. Sudie Bailey.    Interim History:   [name] is seen today with her husband in accompaniment for followup prior to her next every 3 week dose of maintenance Herceptin.She has been seen by Dr Roselind Messier regarding possible xrt to the manubrium and sternum. She is asymptomatic and Dr Roselind Messier did not feel that she could get additional xrt to this area based on cumulative exposure. If she does develop pain she then might receive xrt.. She is doing well otherwise working in the garden and walking in the [employer]. 2d echo 5/23 -wnl Review of systems: Constitutional:  no weight loss, fever, night sweats and feels well Eyes: uses glasses ENT: No complaints Cardiovascular: no chest pain or dyspnea on exertion Respiratory: no cough, shortness of breath, she does have evidence of wheezing. Neurological: no TIA or stroke symptoms Dermatological: negative Gastrointestinal: no abdominal pain, change in bowel habits, or black or bloody stools Genito-Urinary: no dysuria, trouble voiding, or hematuria Hematological and Lymphatic: negative Breast: negative Musculoskeletal: positive for - joint pain, chronic B knees Remaining ROS negative.   Medications:   I have reviewed the patient's current medications.  Current Outpatient Prescriptions  Medication Sig Dispense Refill  . albuterol-ipratropium (COMBIVENT) 18-103 MCG/ACT inhaler Inhale 2 puffs into the lungs every 6 (six)  hours as needed for wheezing.  1 Inhaler  0  . ALPRAZolam (XANAX) 1 MG tablet Take 1-2 tablets (1-2 mg total) by mouth 3 (three) times daily as needed. anxiety  90 tablet    . AmLODIPine Besylate (NORVASC PO) Take 5 mg by mouth every morning.       . cyclobenzaprine (FLEXERIL) 10 MG tablet TAKE 1 TABLET BY MOUTH 3 TIMES A DAY AS NEEDED FOR MUSCLE SPASM  90 tablet  2  . furosemide (LASIX) 80 MG tablet Take 40 mg by mouth every morning.       . gabapentin (NEURONTIN) 300 MG capsule TAKE 2 CAPSULES BY MOUTH 3 TIMES A DAY  180 capsule  0  . losartan (COZAAR) 100 MG tablet Take 100 mg by mouth every morning.       . Potassium Chloride (K-TABS PO) Take 40 mEq by mouth daily.       . prochlorperazine (COMPAZINE) 10 MG tablet Take 10 mg by mouth every 6 (six) hours as needed. nausea      . venlafaxine (EFFEXOR-XR) 37.5 MG 24 hr capsule Take 75 mg by mouth every evening. depression      . warfarin (COUMADIN) 2 MG tablet TAKE 1 TABLET BY MOUTH EVERY DAY AT 4 PM  30 tablet  0  . warfarin (COUMADIN) 5 MG tablet TAKE 1 TABLET BY MOUTH EVERY DAY AS DIRECTED  30 tablet  2  . DISCONTD: gabapentin (NEURONTIN) 600 MG tablet Take 600 mg by mouth 3 (three) times daily.          Allergies:  Allergies  Allergen Reactions  . Adhesive (Tape) Other (See Comments)    Tears skin   . Penicillins Hives  Physical Exam: Filed Vitals:   01/18/12 0844  BP: 173/102  Pulse: 81  Temp: 98.3 F (36.8 C)  Weight: 273 lbs. HEENT:  Sclerae anicteric, conjunctivae pink.  Oropharynx clear.  No mucositis or candidiasis.   Nodes:  No cervical, supraclavicular, or axillary lymphadenopathy palpated.  Lungs:  Clear to auscultation bilaterally.  No crackles, rhonchi, or wheezes.   Heart:  Regular rate and rhythm.    Lab Results: Lab Results  Component Value Date   WBC 8.4 01/18/2012   HGB 12.1 01/18/2012   HCT 34.6* 01/18/2012   MCV 76.4* 01/18/2012   PLT 218 01/18/2012   NEUTROABS 5.1 01/18/2012     Chemistry        Component Value Date/Time   NA 140 12/28/2011 0806   K 3.3* 12/28/2011 0806   CL 104 12/28/2011 0806   CO2 24 12/28/2011 0806   BUN 26* 12/28/2011 0806   CREATININE 1.01 12/28/2011 0806      Component Value Date/Time   CALCIUM 8.0* 12/28/2011 0806   ALKPHOS 83 12/28/2011 0806   AST 15 12/28/2011 0806   ALT 13 12/28/2011 0806   BILITOT 0.6 12/28/2011 0806     Echocardiogram: Study Conclusions  - Left ventricle: The cavity size was normal. Wall thickness was normal. The estimated ejection fraction was 60%. Wall motion was normal; there were no regional wall motion abnormalities. - Right ventricle: Poorly visualized. The cavity size was normal. Systolic function was normal. - Atrial septum: A patent foramen ovale cannot be excluded. On prior studies, it was felt there was no definite PFO. There is a color signal now that suggests there may be a PFO. Transthoracic echocardiography. M-mode, complete 2D, spectral Doppler, and color Doppler. Height: Height: 158.8cm. Height: 62.5in. Weight: Weight: 118.6kg. Weight: 261lb. Body mass index: BMI: 47.1kg/m^2. Body surface area: BSA: 2.61m^2. Blood pressure: 124/66. Patient status: Outpatient. Location: Echo laboratory.  ------------------------------------------------------------  ------------------------------------------------------------ Left ventricle: The cavity size was normal. Wall thickness was normal. The estimated ejection fraction was 60%. Wall motion was normal; there were no regional wall motion abnormalities.  ------------------------------------------------------------ Aortic valve: Structurally normal valve. Cusp separation was normal. Doppler: Transvalvular velocity was within the normal range. There was no stenosis. No regurgitation.  ------------------------------------------------------------ Aorta: Aortic root: The aortic root was normal in size.  ------------------------------------------------------------ Mitral valve:  Structurally normal valve. Leaflet separation was normal. Doppler: Transvalvular velocity was within the normal range. There was no evidence for stenosis. No regurgitation. Peak gradient: 3mm Hg (D).  ------------------------------------------------------------ Left atrium: The atrium was normal in size.  ------------------------------------------------------------ Atrial septum: A patent foramen ovale cannot be excluded. On prior studies, it was felt there was no definite PFO. There is a color signal now that suggests there may be a PFO.  ------------------------------------------------------------ Right ventricle: Poorly visualized. The cavity size was normal. Systolic function was normal.  ------------------------------------------------------------ Pulmonic valve: The valve appears to be grossly normal. Doppler: No significant regurgitation.  ------------------------------------------------------------ Tricuspid valve: Structurally normal valve. Leaflet separation was normal. Doppler: Transvalvular velocity was within the normal range. No regurgitation.  ------------------------------------------------------------ Right atrium: The atrium was at the upper limits of normal in size.  ------------------------------------------------------------ Pericardium: There was no pericardial effusion.  ----------------  Radiographic data: 11/19/11  NUCLEAR MEDICINE PET CT RESTAGING (PS) SKULL BASE TO THIGH  Technique: 16.4 mCi F-18 FDG was injected intravenously via the  right AC. Full-ring PET imaging was performed from the skull base  through the mid-thighs 70 minutes after injection. CT data was  obtained and used  for attenuation correction and anatomic  localization only. (This was not acquired as a diagnostic CT  examination.)  Fasting Blood Glucose: 99  Patient Weight: 265 pounds.  Comparison: PET CT 09/01/2010.  Findings:  Skull Base and Neck: Within the region of the  palatine tonsils  bilaterally there is hypermetabolic activity (SUVmax = 9.3 on the  left and 11 on the right). No additional focal hypermetabolic  activity noted within the neck. No focal soft tissue masses or  significant lymphadenopathy.  Thorax: Bilateral modified radical mastectomies and axillary nodal  dissections again noted. Within the manubrium and the sternum  centered around the manubriosternal joint there are areas of  increasing sclerosis, with new hypermetabolic activity (SUVmax =  five). Lower down in the sternum there is no definite focal bony  lesion identified on CT images (image 76 of series 2), however,  there is a small focus of hypermetabolic activity (SUVmax = 4.1),  concerning for a bony metastasis. In the lateral aspect of the left  breast there is a small postoperative fluid collection which is  similar in size to the prior examination (approximately 4.1 x 1.3  cm), which demonstrates a small amount of hypermetabolic activity  along its superior margin (SUVmax = 4.1). Within the superolateral  aspect of the left pectoralis major muscle there is a large focus  of increased metabolic activity (SUVmax = 5.1)however, there is no  definite corresponding focal soft tissue mass identified on the  noncontrast portion of the CT images, and this region appears to  have previously had a Port-A-Cath catheter traversing the muscle.  The previously noted left-sided Port-A-Cath has been removed, there  has been interval placement of a right internal jugular infusion  catheter with tip terminating in the right atrium. There is also  some increased metabolic activity within the low right paraspinal  musculature (SUVmax = 5.8), without a definite focal soft tissue  mass. Compared to the prior examination, the previously noted air  space disease in the right upper lobe has resolved. Although  evaluation of the lung parenchyma is slightly limited by  respiratory motion there  appears to be an new 8 mm nodule in the  left lower lobe (image 85 of series 2), which does not demonstrate  definite hypermetabolic activity on the PET portion of the  examination. Heart is moderately enlarged. No definite  mediastinal or hilar adenopathy.  Abdomen/Pelvis: No abnormal hypermetabolic activity. The uninfused  appearance of the liver, pancreas, spleen, bilateral adrenal glands  and bilateral kidneys is unremarkable. The patient is status post  cholecystectomy. No abnormal soft tissue masses or  lymphadenopathy. No ascites or pneumoperitoneum, and no pathologic  distension of bowel. Numerous colonic diverticula, without  surrounding inflammatory changes to suggest acute diverticulitis at  this time. The uterus, left ovary and urinary bladder are  unremarkable in appearance. Within the right ovary there is a  partially calcified lesion which is nonspecific in appearance,  unchanged compared to the prior study, and does not exhibit  hypermetabolic activity, presumably a benign finding.  Upper Thighs: No abnormal hypermetabolic activity. No abnormal  soft tissue masses or lymphadenopathy.  IMPRESSION:  1. Status post bilateral modified radical mastectomy and axillary  nodal dissection with a small postoperative fluid collection in the  lateral aspect of the left breast which appears similar in size  compared the prior examination, however, this is now demonstrating  some mildly increased metabolic activity along its superior margin  (SUVmax = 4.1). In addition, there are  new foci of hypermetabolic  activity within the manubrium of the sternum, as above, concerning  for metastatic disease.  2. There is also new hypermetabolic activity in the superior  lateral aspect of the left pectoralis major muscle. However, there  is no definite focal soft tissue mass in this region, and this area  of hypermetabolic activity appears to correspond with the tract of  the previously noted  left-sided Port-A-Cath. This could simply  represents increased metabolic activity from normal muscular  healing, however, attention on follow-up studies is recommended.  3. New 8 mm nodule in the left lower lobe. While this nodule does  not demonstrate definite hypermetabolic activity, this may be below  the discriminatory size for PET. Attention on follow-up is  recommended.  4. Increased activity within the palatine tonsils bilaterally,  favored to be of infectious or inflammatory etiology. Clinical  correlation is recommended.  5. Increased metabolic activity within the right paraspinal  musculature in the lower thoracic region, without corresponding  soft tissue mass. This is presumably physiologic.  6. Colonic diverticulosis without findings to suggest acute  diverticulitis.  7. Status post cholecystectomy.  Original Report Authenticated By: Florencia Reasons, M.D.     CT CHEST, ABDOMEN AND PELVIS WITH CONTRAST  Technique: Multidetector CT imaging of the chest, abdomen and  pelvis was performed following the standard protocol during bolus  administration of intravenous contrast.  Contrast: 100 ml of Omnipaque-300.  Comparison: PET CT 09/01/2010. Chest abdomen and pelvis CT  03/08/2011.  CT CHEST  Findings:  Mediastinum: Heart size is mildly enlarged. There is no significant  pericardial fluid, thickening or pericardial calcification. No  pathologically enlarged mediastinal or hilar lymph nodes. New right  internal jugular central venous catheter with tip terminating in  the right atrium. Previously noted left-sided Port-A-Cath has been  removed. Esophagus is unremarkable in appearance.  Lungs/Pleura: Image 31 of series 4 demonstrates a new 8 mm nodule  in the left lower lobe. No other suspicious appearing pulmonary  nodules or masses are otherwise identified. No consolidative  airspace disease. No pleural effusions. Minimal subpleural  reticulation is noted in the  anterolateral aspect of the right  upper lobe, similar to prior study from 03/08/2011.  Musculoskeletal: Postoperative changes of bilateral modified  radical mastectomy and axillary nodal dissection are again noted.  In the lateral aspect of the left breast parenchyma inferior and  lateral to the left pectoralis major muscle there is a small  postoperative fluid collection with a thick rim of soft tissue  which measures approximately 4.1 x 1.3 cm. This is similar in size  to the prior examination. Within the manubrium and sternum  centered around the manubrial sternal joint there are new areas of  sclerosis, which correspond with regions of hypermetabolic activity  on the PET portion of the examination), concerning for skeletal  metastasis. No other definite aggressive appearing lytic or  blastic bony lesion is identified within the visualized portions of  the skeleton.  IMPRESSION:  1. Interval development of a new ill-defined 8 mm left lower lobe  pulmonary nodule, which is nonspecific in appearance, but warrants  close attention on short-term followup study in 3 months, as  metastatic disease is not excluded.  2. Interval development of the expansion and sclerosis of the  inferior aspect of the manubrium and superior aspect of the sternum  centered around the manubriosternal joint, with corresponding  hypermetabolic activity on contemporaneous PET examination,  concerning for a new skeletal metastasis.  3.  Although the postoperative fluid collection in the lateral  aspect of the left breast is similar in size and appearance on the  CT portion of today's examination, there was hypermetabolic  activity within the superior lateral aspect of this collection on  the contemporaneous PET examination, which could suggest recurrent  disease. Continued attention on follow-up studies is recommended.  CT ABDOMEN AND PELVIS  Findings:  Abdomen/Pelvis: Status post cholecystectomy. Minimal  intrahepatic  biliary ductal dilatation (unchanged). No extrahepatic biliary  ductal dilatation. No focal hepatic lesions are identified. The  enhanced appearance of the pancreas, spleen, bilateral adrenal  glands and bilateral kidneys is unremarkable.  There is no ascites or pneumoperitoneum and no pathologic  distension of bowel. No definite pathologic adenopathy identified  within the abdomen or pelvis. There are numerous colonic  diverticula, predominantly in the sigmoid and descending colon,  without surrounding inflammatory changes to suggest acute  diverticulitis at this time. The uterus, left ovary and urinary  bladder are unremarkable in appearance. The right ovary appears  enlarged with two rim calcified lesions, which overall appear  unchanged in size and appearance compared to the prior examination  from 03/08/2011, presumably a benign finding.  Musculoskeletal: A well defined lucent lesion with sclerotic  margins and narrow zone of transition is noted in the anterior  aspect of the right femoral head, unchanged compared to prior  examination (with no corresponding hypermetabolic activity on the  contemporaneous PET examination), likely represent a large  subchondral cyst. There are no aggressive appearing lytic or  blastic lesions noted in the visualized portions of the skeleton.  IMPRESSION:  1. No findings to suggest the presence of metastatic disease in  the abdomen or pelvis on today's examination.  2. Status post cholecystectomy.  3. Colonic diverticulosis without findings to suggest acute  diverticulitis.  Original Report Authenticated By: Florencia Reasons, M.D.    Assessment:  A 61 year old [place], [name], woman with a history of metastatic HER-2 positive breast carcinoma originally diagnosed September 2004, on maintenance q.3-week Herceptin.  2. 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  3. History of chemotherapy-induced  neuropathy. Has been on Neurontin  600 mg p.o. t.i.d. in the past.  4. History of hypertension, managed by Dr. Sudie Bailey.  5. Evidence to suggest sternal/manubrial osseous metastatic deposit.    Plan:  [name] will receive Herceptin today as scheduled. We had a long discussion regarding other possible intervention,  I have recommended holding off for the time being given her lack of symptoms.we will treat her today and f/u in 3 weeks Julitza Rickles, MD 12/07/11

## 2012-02-04 NOTE — Progress Notes (Signed)
Encounter addended by: Tessa Lerner, RN on: 02/04/2012 12:09 PM<BR>     Documentation filed: Charges VN

## 2012-02-08 ENCOUNTER — Ambulatory Visit (HOSPITAL_BASED_OUTPATIENT_CLINIC_OR_DEPARTMENT_OTHER): Payer: Medicare Other | Admitting: Physician Assistant

## 2012-02-08 ENCOUNTER — Other Ambulatory Visit (HOSPITAL_BASED_OUTPATIENT_CLINIC_OR_DEPARTMENT_OTHER): Payer: Medicare Other | Admitting: Lab

## 2012-02-08 ENCOUNTER — Other Ambulatory Visit: Payer: Self-pay | Admitting: Oncology

## 2012-02-08 ENCOUNTER — Ambulatory Visit (HOSPITAL_BASED_OUTPATIENT_CLINIC_OR_DEPARTMENT_OTHER): Payer: Medicare Other

## 2012-02-08 ENCOUNTER — Encounter: Payer: Self-pay | Admitting: Physician Assistant

## 2012-02-08 ENCOUNTER — Other Ambulatory Visit: Payer: Self-pay | Admitting: Physician Assistant

## 2012-02-08 VITALS — BP 122/80 | HR 88

## 2012-02-08 VITALS — BP 123/87 | HR 170 | Temp 98.3°F | Ht 63.0 in | Wt 289.2 lb

## 2012-02-08 DIAGNOSIS — C50919 Malignant neoplasm of unspecified site of unspecified female breast: Secondary | ICD-10-CM

## 2012-02-08 DIAGNOSIS — Z7901 Long term (current) use of anticoagulants: Secondary | ICD-10-CM

## 2012-02-08 DIAGNOSIS — Z5112 Encounter for antineoplastic immunotherapy: Secondary | ICD-10-CM

## 2012-02-08 DIAGNOSIS — E559 Vitamin D deficiency, unspecified: Secondary | ICD-10-CM

## 2012-02-08 LAB — CBC WITH DIFFERENTIAL/PLATELET
Basophils Absolute: 0 10*3/uL (ref 0.0–0.1)
EOS%: 4 % (ref 0.0–7.0)
MCH: 26.7 pg (ref 25.1–34.0)
MCHC: 34.8 g/dL (ref 31.5–36.0)
MCV: 76.8 fL — ABNORMAL LOW (ref 79.5–101.0)
MONO%: 9.3 % (ref 0.0–14.0)
RBC: 4.49 10*6/uL (ref 3.70–5.45)
RDW: 15.9 % — ABNORMAL HIGH (ref 11.2–14.5)

## 2012-02-08 LAB — PROTIME-INR: INR: 2.3 (ref 2.00–3.50)

## 2012-02-08 MED ORDER — ACETAMINOPHEN 325 MG PO TABS
650.0000 mg | ORAL_TABLET | Freq: Once | ORAL | Status: AC
  Administered 2012-02-08: 650 mg via ORAL

## 2012-02-08 MED ORDER — SODIUM CHLORIDE 0.9 % IJ SOLN
10.0000 mL | INTRAMUSCULAR | Status: DC | PRN
  Administered 2012-02-08: 10 mL
  Filled 2012-02-08: qty 10

## 2012-02-08 MED ORDER — DIPHENHYDRAMINE HCL 25 MG PO CAPS
50.0000 mg | ORAL_CAPSULE | Freq: Once | ORAL | Status: AC
  Administered 2012-02-08: 50 mg via ORAL

## 2012-02-08 MED ORDER — SODIUM CHLORIDE 0.9 % IV SOLN
Freq: Once | INTRAVENOUS | Status: AC
  Administered 2012-02-08: 10:00:00 via INTRAVENOUS

## 2012-02-08 MED ORDER — LORAZEPAM 2 MG/ML IJ SOLN
1.0000 mg | Freq: Once | INTRAMUSCULAR | Status: AC
  Administered 2012-02-08: 1 mg via INTRAVENOUS

## 2012-02-08 MED ORDER — HEPARIN SOD (PORK) LOCK FLUSH 100 UNIT/ML IV SOLN
500.0000 [IU] | Freq: Once | INTRAVENOUS | Status: AC | PRN
  Administered 2012-02-08: 500 [IU]
  Filled 2012-02-08: qty 5

## 2012-02-08 MED ORDER — TRASTUZUMAB CHEMO INJECTION 440 MG
6.0000 mg/kg | Freq: Once | INTRAVENOUS | Status: AC
  Administered 2012-02-08: 777 mg via INTRAVENOUS
  Filled 2012-02-08: qty 37

## 2012-02-08 NOTE — Progress Notes (Signed)
Hematology and Oncology Follow Up Visit  [name] [id number] [birth year] 61 y.o. 02/08/12    HPI: A 61 year old [place], [place], woman with a history of metastatic HER-2 positive breast carcinoma originally diagnosed September 2004, on maintenance q.3-week Herceptin, due for next dosing today.  2. 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   3. History of chemotherapy-induced neuropathy.  On Neurontin 600 mg p.o. T.i.d.   4. History of hypertension, managed by Dr. Sudie Bailey.    Interim History:   [name] is seen today with her husband in accompaniment for followup prior to her next every 3 week dose of maintenance Herceptin. She denies any new complaints, she still has ongoing issues with pain, but utilizes MS Contin appropriately. She also has history of chemotherapy-induced neuropathy and utilizes Neurontin 600 mg by mouth 3 times a day. She denies any fevers, chills, or night sweats. No increased shortness of breath or unusual episodes of chest pain. Her appetite has been fine, no persistent issues with nausea, emesis diarrhea or constipation.  Review of systems: Constitutional:  no weight loss, fever, night sweats and feels well Eyes: uses glasses ENT: No complaints Cardiovascular: no chest pain or dyspnea on exertion Respiratory: no cough, shortness of breath, she does have evidence of wheezing. Neurological: no TIA or stroke symptoms Dermatological: negative Gastrointestinal: no abdominal pain, change in bowel habits, or black or bloody stools Genito-Urinary: no dysuria, trouble voiding, or hematuria Hematological and Lymphatic: negative Breast: negative Musculoskeletal: positive for - joint pain, chronic B knees Remaining ROS negative.   Medications:   I have reviewed the patient's current medications.  Current Outpatient Prescriptions  Medication Sig Dispense Refill  . albuterol-ipratropium (COMBIVENT) 18-103 MCG/ACT inhaler Inhale 2  puffs into the lungs every 6 (six) hours as needed for wheezing.  1 Inhaler  0  . ALPRAZolam (XANAX) 1 MG tablet Take 1-2 tablets (1-2 mg total) by mouth 3 (three) times daily as needed. anxiety  90 tablet  0  . AmLODIPine Besylate (NORVASC PO) Take 5 mg by mouth every morning.       . cyclobenzaprine (FLEXERIL) 10 MG tablet TAKE 1 TABLET BY MOUTH 3 TIMES A DAY AS NEEDED FOR MUSCLE SPASM  90 tablet  2  . furosemide (LASIX) 80 MG tablet Take 40 mg by mouth every morning.       . gabapentin (NEURONTIN) 300 MG capsule TAKE 2 CAPSULES BY MOUTH 3 TIMES A DAY  180 capsule  0  . losartan (COZAAR) 100 MG tablet Take 100 mg by mouth every morning.       . Potassium Chloride (K-TABS PO) Take 40 mEq by mouth daily.       . prochlorperazine (COMPAZINE) 10 MG tablet Take 10 mg by mouth every 6 (six) hours as needed. nausea      . venlafaxine (EFFEXOR-XR) 37.5 MG 24 hr capsule Take 75 mg by mouth every evening. depression      . warfarin (COUMADIN) 2 MG tablet TAKE 1 TABLET BY MOUTH EVERY DAY AT 4 PM  30 tablet  0  . warfarin (COUMADIN) 5 MG tablet TAKE 1 TABLET BY MOUTH EVERY DAY AS DIRECTED  30 tablet  2    Allergies:  Allergies  Allergen Reactions  . Adhesive (Tape) Other (See Comments)    Tears skin   . Penicillins Hives    Physical Exam: Filed Vitals:   02/08/12 0834  BP: 123/87  Pulse: 170  Temp: 98.3 F (36.8 C)  Weight: 289  lbs. HEENT:  Sclerae anicteric, conjunctivae pink.  Oropharynx clear.  No mucositis or candidiasis.   Nodes:  No cervical, supraclavicular, or axillary lymphadenopathy palpated.  Lungs:  Clear to auscultation bilaterally.  No crackles, rhonchi, or wheezes.   Heart:  Regular rate and rhythm.    Lab Results: Lab Results  Component Value Date   WBC 8.2 02/08/2012   HGB 12.0 02/08/2012   HCT 34.5* 02/08/2012   MCV 76.8* 02/08/2012   PLT 245 02/08/2012   NEUTROABS 3.9 02/08/2012     Chemistry      Component Value Date/Time   NA 140 01/18/2012 0802   K 4.1 01/18/2012  0802   CL 105 01/18/2012 0802   CO2 27 01/18/2012 0802   BUN 15 01/18/2012 0802   CREATININE 0.77 01/18/2012 0802      Component Value Date/Time   CALCIUM 9.8 01/18/2012 0802   ALKPHOS 92 01/18/2012 0802   AST 15 01/18/2012 0802   ALT 14 01/18/2012 0802   BILITOT 0.7 01/18/2012 0802       Assessment:  A 61 year old [place], [place], woman with a history of metastatic HER-2 positive breast carcinoma originally diagnosed September 2004, on maintenance q.3-week Herceptin.   2. 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   3. History of chemotherapy-induced neuropathy.  Continues on Neurontin 600 mg p.o. t.i.d.  4. History of hypertension, managed by Dr. Sudie Bailey.    Case reviewed with Dr. Pierce Crane.  Plan:  [name] will receive Herceptin today as scheduled, and return on 02/29/12 for follow up prior to her next Herceptin dosing. Refill on MS Contin 60mg  orally twice a day, and MSIR 15mg  tablets provided. This plan was reviewed with the patient, who voices understanding and agreement.  She knows to call with any changes or problems.   Geselle Cardosa T, PA-C 02/08/12

## 2012-02-08 NOTE — Patient Instructions (Signed)
[facility] Discharge Instructions for Patients Receiving Chemotherapy  Today you received the following chemotherapy agents Herceptin.   If you develop nausea and vomiting that is not controlled by your nausea medication, call the clinic. If it is after clinic hours your family physician or the after hours number for the clinic or go to the Emergency Department.   BELOW ARE SYMPTOMS THAT SHOULD BE REPORTED IMMEDIATELY:  *FEVER GREATER THAN 100.5 F  *CHILLS WITH OR WITHOUT FEVER  NAUSEA AND VOMITING THAT IS NOT CONTROLLED WITH YOUR NAUSEA MEDICATION  *UNUSUAL SHORTNESS OF BREATH  *UNUSUAL BRUISING OR BLEEDING  TENDERNESS IN MOUTH AND THROAT WITH OR WITHOUT PRESENCE OF ULCERS  *URINARY PROBLEMS  *BOWEL PROBLEMS  UNUSUAL RASH Items with * indicate a potential emergency and should be followed up as soon as possible.  One of the nurses will contact you 24 hours after your treatment. Please let the nurse know about any problems that you may have experienced. Feel free to call the clinic you have any questions or concerns. The clinic phone number is [phone].   I have been informed and understand all the instructions given to me. I know to contact the clinic, my physician, or go to the Emergency Department if any problems should occur. I do not have any questions at this time, but understand that I may call the clinic during office hours   should I have any questions or need assistance in obtaining follow up care.    __________________________________________  _____________  __________ Signature of Patient or Authorized Representative            Date                   Time    __________________________________________ Nurse's Signature   

## 2012-02-09 LAB — COMPREHENSIVE METABOLIC PANEL
AST: 21 U/L (ref 0–37)
Albumin: 3.4 g/dL — ABNORMAL LOW (ref 3.5–5.2)
Alkaline Phosphatase: 85 U/L (ref 39–117)
BUN: 18 mg/dL (ref 6–23)
Potassium: 3.3 mEq/L — ABNORMAL LOW (ref 3.5–5.3)
Sodium: 140 mEq/L (ref 135–145)

## 2012-02-19 ENCOUNTER — Other Ambulatory Visit: Payer: Self-pay

## 2012-02-19 DIAGNOSIS — C50919 Malignant neoplasm of unspecified site of unspecified female breast: Secondary | ICD-10-CM

## 2012-02-19 MED ORDER — ALPRAZOLAM 1 MG PO TABS: 1.0000 mg | ORAL_TABLET | Freq: Three times a day (TID) | ORAL | Status: DC | PRN

## 2012-02-29 ENCOUNTER — Ambulatory Visit (HOSPITAL_BASED_OUTPATIENT_CLINIC_OR_DEPARTMENT_OTHER): Payer: Medicare Other

## 2012-02-29 ENCOUNTER — Encounter: Payer: Self-pay | Admitting: Physician Assistant

## 2012-02-29 ENCOUNTER — Telehealth: Payer: Self-pay | Admitting: *Deleted

## 2012-02-29 ENCOUNTER — Ambulatory Visit (HOSPITAL_BASED_OUTPATIENT_CLINIC_OR_DEPARTMENT_OTHER): Payer: Medicare Other | Admitting: Physician Assistant

## 2012-02-29 ENCOUNTER — Other Ambulatory Visit: Payer: Medicare Other | Admitting: Lab

## 2012-02-29 VITALS — BP 130/83 | HR 84 | Temp 98.7°F | Ht 63.0 in | Wt 285.5 lb

## 2012-02-29 DIAGNOSIS — C50919 Malignant neoplasm of unspecified site of unspecified female breast: Secondary | ICD-10-CM

## 2012-02-29 DIAGNOSIS — I821 Thrombophlebitis migrans: Secondary | ICD-10-CM

## 2012-02-29 DIAGNOSIS — G620 Drug-induced polyneuropathy: Secondary | ICD-10-CM

## 2012-02-29 DIAGNOSIS — Z5112 Encounter for antineoplastic immunotherapy: Secondary | ICD-10-CM

## 2012-02-29 LAB — CBC WITH DIFFERENTIAL/PLATELET
Basophils Absolute: 0 10*3/uL (ref 0.0–0.1)
HCT: 34.9 % (ref 34.8–46.6)
HGB: 12.2 g/dL (ref 11.6–15.9)
MONO#: 0.6 10*3/uL (ref 0.1–0.9)
NEUT%: 65.9 % (ref 38.4–76.8)
WBC: 9.1 10*3/uL (ref 3.9–10.3)
lymph#: 2.4 10*3/uL (ref 0.9–3.3)

## 2012-02-29 LAB — PROTIME-INR: INR: 1.5 — ABNORMAL LOW (ref 2.00–3.50)

## 2012-02-29 MED ORDER — TRASTUZUMAB CHEMO INJECTION 440 MG
6.0000 mg/kg | Freq: Once | INTRAVENOUS | Status: AC
  Administered 2012-02-29: 777 mg via INTRAVENOUS
  Filled 2012-02-29: qty 37

## 2012-02-29 MED ORDER — DIPHENHYDRAMINE HCL 25 MG PO CAPS
50.0000 mg | ORAL_CAPSULE | Freq: Once | ORAL | Status: AC
  Administered 2012-02-29: 50 mg via ORAL

## 2012-02-29 MED ORDER — SODIUM CHLORIDE 0.9 % IJ SOLN
10.0000 mL | INTRAMUSCULAR | Status: DC | PRN
  Administered 2012-02-29: 10 mL
  Filled 2012-02-29: qty 10

## 2012-02-29 MED ORDER — SODIUM CHLORIDE 0.9 % IV SOLN
Freq: Once | INTRAVENOUS | Status: AC
  Administered 2012-02-29: 10:00:00 via INTRAVENOUS

## 2012-02-29 MED ORDER — ACETAMINOPHEN 325 MG PO TABS
650.0000 mg | ORAL_TABLET | Freq: Once | ORAL | Status: AC
  Administered 2012-02-29: 650 mg via ORAL

## 2012-02-29 MED ORDER — HEPARIN SOD (PORK) LOCK FLUSH 100 UNIT/ML IV SOLN
500.0000 [IU] | Freq: Once | INTRAVENOUS | Status: AC | PRN
  Administered 2012-02-29: 500 [IU]
  Filled 2012-02-29: qty 5

## 2012-02-29 MED ORDER — LORAZEPAM 2 MG/ML IJ SOLN
1.0000 mg | Freq: Once | INTRAMUSCULAR | Status: AC
  Administered 2012-02-29: 1 mg via INTRAVENOUS

## 2012-02-29 NOTE — Progress Notes (Signed)
Hematology and Oncology Follow Up Visit  APOLLONIA AMINI [id number] 21-Jun-1951 61 y.o. 02/29/12   HPI: A 61 year [place], woman with a history of metastatic HER-2 positive breast carcinoma originally diagnosed September 2004, on maintenance q.3-week Herceptin, due for next dosing today.  2. 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   3. History of chemotherapy-induced neuropathy.  On Neurontin 600 mg p.o. T.i.d.   4. History of hypertension, managed by Dr. Sudie Bailey.    Interim History:   [name] is seen today with her husband in accompaniment for followup prior to her next every 3 week dose of maintenance Herceptin. She denies any new complaints, she still has ongoing issues with pain, but utilizes MS Contin appropriately. She also has history of chemotherapy-induced neuropathy and utilizes Neurontin 600 mg by mouth 3 times a day. She denies any fevers, chills, or night sweats. No increased shortness of breath or unusual episodes of chest pain. Her appetite has been fine, no persistent issues with nausea, emesis diarrhea or constipation.  Review of systems: Constitutional:  no weight loss, fever, night sweats and feels well Eyes: uses glasses ENT: No complaints Cardiovascular: no chest pain or dyspnea on exertion Respiratory: no cough, shortness of breath, she does have evidence of wheezing. Neurological: no TIA or stroke symptoms Dermatological: negative Gastrointestinal: no abdominal pain, change in bowel habits, or black or bloody stools Genito-Urinary: no dysuria, trouble voiding, or hematuria Hematological and Lymphatic: negative Breast: negative Musculoskeletal: positive for - joint pain, chronic B knees Remaining ROS negative.   Medications:   I have reviewed the patient's current medications.  Current Outpatient Prescriptions  Medication Sig Dispense Refill  . albuterol-ipratropium (COMBIVENT) 18-103 MCG/ACT inhaler Inhale 2 puffs  into the lungs every 6 (six) hours as needed for wheezing.  1 Inhaler  0  . ALPRAZolam (XANAX) 1 MG tablet Take 1-2 tablets (1-2 mg total) by mouth 3 (three) times daily as needed. anxiety  90 tablet  0  . AmLODIPine Besylate (NORVASC PO) Take 5 mg by mouth every morning.       . cyclobenzaprine (FLEXERIL) 10 MG tablet TAKE 1 TABLET BY MOUTH 3 TIMES A DAY AS NEEDED FOR MUSCLE SPASM  90 tablet  2  . furosemide (LASIX) 80 MG tablet Take 40 mg by mouth every morning.       . gabapentin (NEURONTIN) 300 MG capsule TAKE 2 CAPSULES BY MOUTH 3 TIMES A DAY  180 capsule  0  . losartan (COZAAR) 100 MG tablet Take 100 mg by mouth every morning.       Marland Kitchen morphine (MS CONTIN) 60 MG 12 hr tablet Take 60 mg by mouth 2 (two) times daily.      Marland Kitchen morphine (MSIR) 15 MG tablet Take 15 mg by mouth every 4 (four) hours as needed.      . Potassium Chloride (K-TABS PO) Take 40 mEq by mouth daily.       Marland Kitchen warfarin (COUMADIN) 2 MG tablet TAKE 1 TABLET BY MOUTH EVERY DAY AT 4 PM  30 tablet  0  . warfarin (COUMADIN) 5 MG tablet TAKE 1 TABLET BY MOUTH EVERY DAY AS DIRECTED  30 tablet  2  . prochlorperazine (COMPAZINE) 10 MG tablet Take 10 mg by mouth every 6 (six) hours as needed. nausea        Allergies:  Allergies  Allergen Reactions  . Adhesive (Tape) Other (See Comments)    Tears skin   . Penicillins Hives  Physical Exam: Filed Vitals:   02/29/12 0844  BP: 130/83  Pulse: 84  Temp: 98.7 F (37.1 C)  Weight: 285 lbs. HEENT:  Sclerae anicteric, conjunctivae pink.  Oropharynx clear.  No mucositis or candidiasis.   Nodes:  No cervical, supraclavicular, or axillary lymphadenopathy palpated.  Lungs:  Clear to auscultation bilaterally.  No crackles, rhonchi, or wheezes.   Heart:  Regular rate and rhythm.    Lab Results: Lab Results  Component Value Date   WBC 9.1 02/29/2012   HGB 12.2 02/29/2012   HCT 34.9 02/29/2012   MCV 76.0* 02/29/2012   PLT 230 02/29/2012   NEUTROABS 6.0 02/29/2012     Chemistry        Component Value Date/Time   NA 140 02/08/2012 0803   K 3.3* 02/08/2012 0803   CL 101 02/08/2012 0803   CO2 28 02/08/2012 0803   BUN 18 02/08/2012 0803   CREATININE 0.92 02/08/2012 0803      Component Value Date/Time   CALCIUM 8.9 02/08/2012 0803   ALKPHOS 85 02/08/2012 0803   AST 21 02/08/2012 0803   ALT 22 02/08/2012 0803   BILITOT 0.8 02/08/2012 0803       Assessment:  A 61 year old [place], [place], woman with a history of metastatic HER-2 positive breast carcinoma originally diagnosed September 2004, on maintenance q.3-week Herceptin.   2. 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   3. History of chemotherapy-induced neuropathy.  Continues on Neurontin 600 mg p.o. t.i.d.  4. History of hypertension, managed by Dr. Sudie Bailey.    Case reviewed with Dr. Pierce Crane.  Plan:  [name] will receive Herceptin today as scheduled, and return on 03/21/12 for follow up prior to her next Herceptin dosing. Refill on MS Contin 60mg  orally twice a day, and MSIR 15mg  tablets provided. This plan was reviewed with the patient, who voices understanding and agreement.  She knows to call with any changes or problems.   Isadore Palecek T, PA-C 02/29/12

## 2012-02-29 NOTE — Telephone Encounter (Signed)
made patient appointment for 03-21-2012 04-11-2012 05-02-2012 sent michelle email to set up treatment for patient

## 2012-02-29 NOTE — Patient Instructions (Signed)
[facility] Discharge Instructions for Patients Receiving Chemotherapy  Today you received the following chemotherapy agent Herceptin.  To help prevent nausea and vomiting after your treatment, we encourage you to take your nausea medication. Begin taking it as often as prescribed for by Dr. Donnie Coffin.    If you develop nausea and vomiting that is not controlled by your nausea medication, call the clinic. If it is after clinic hours your family physician or the after hours number for the clinic or go to the Emergency Department.   BELOW ARE SYMPTOMS THAT SHOULD BE REPORTED IMMEDIATELY:  *FEVER GREATER THAN 100.5 F  *CHILLS WITH OR WITHOUT FEVER  NAUSEA AND VOMITING THAT IS NOT CONTROLLED WITH YOUR NAUSEA MEDICATION  *UNUSUAL SHORTNESS OF BREATH  *UNUSUAL BRUISING OR BLEEDING  TENDERNESS IN MOUTH AND THROAT WITH OR WITHOUT PRESENCE OF ULCERS  *URINARY PROBLEMS  *BOWEL PROBLEMS  UNUSUAL RASH Items with * indicate a potential emergency and should be followed up as soon as possible.  One of the nurses will contact you 24 hours after your treatment. Please let the nurse know about any problems that you may have experienced. Feel free to call the clinic you have any questions or concerns. The clinic phone number is [phone].   I have been informed and understand all the instructions given to me. I know to contact the clinic, my physician, or go to the Emergency Department if any problems should occur. I do not have any questions at this time, but understand that I may call the clinic during office hours   should I have any questions or need assistance in obtaining follow up care.    __________________________________________  _____________  __________ Signature of Patient or Authorized Representative            Date                   Time    __________________________________________ Nurse's Signature

## 2012-02-29 NOTE — Telephone Encounter (Signed)
Per staff message I have scheduled appts. JMW  

## 2012-03-19 ENCOUNTER — Other Ambulatory Visit: Payer: Self-pay | Admitting: *Deleted

## 2012-03-19 DIAGNOSIS — C50919 Malignant neoplasm of unspecified site of unspecified female breast: Secondary | ICD-10-CM

## 2012-03-19 MED ORDER — ALPRAZOLAM 1 MG PO TABS: 1.0000 mg | ORAL_TABLET | Freq: Three times a day (TID) | ORAL | Status: DC | PRN

## 2012-03-21 ENCOUNTER — Other Ambulatory Visit: Payer: Medicare Other | Admitting: Lab

## 2012-03-21 ENCOUNTER — Ambulatory Visit (HOSPITAL_BASED_OUTPATIENT_CLINIC_OR_DEPARTMENT_OTHER): Payer: Medicare Other | Admitting: Physician Assistant

## 2012-03-21 ENCOUNTER — Ambulatory Visit (HOSPITAL_BASED_OUTPATIENT_CLINIC_OR_DEPARTMENT_OTHER): Payer: Medicare Other

## 2012-03-21 VITALS — BP 183/112 | HR 73 | Temp 98.1°F | Ht 63.0 in | Wt 290.3 lb

## 2012-03-21 DIAGNOSIS — I871 Compression of vein: Secondary | ICD-10-CM

## 2012-03-21 DIAGNOSIS — C50919 Malignant neoplasm of unspecified site of unspecified female breast: Secondary | ICD-10-CM

## 2012-03-21 DIAGNOSIS — Z5112 Encounter for antineoplastic immunotherapy: Secondary | ICD-10-CM

## 2012-03-21 DIAGNOSIS — R079 Chest pain, unspecified: Secondary | ICD-10-CM

## 2012-03-21 DIAGNOSIS — D702 Other drug-induced agranulocytosis: Secondary | ICD-10-CM

## 2012-03-21 DIAGNOSIS — I10 Essential (primary) hypertension: Secondary | ICD-10-CM

## 2012-03-21 MED ORDER — TRASTUZUMAB CHEMO INJECTION 440 MG
6.0000 mg/kg | Freq: Once | INTRAVENOUS | Status: AC
  Administered 2012-03-21: 777 mg via INTRAVENOUS
  Filled 2012-03-21: qty 37

## 2012-03-21 MED ORDER — AMLODIPINE BESYLATE 5 MG PO TABS
5.0000 mg | ORAL_TABLET | Freq: Every day | ORAL | Status: DC
  Administered 2012-03-21: 5 mg via ORAL
  Filled 2012-03-21: qty 1

## 2012-03-21 MED ORDER — ACETAMINOPHEN 325 MG PO TABS
650.0000 mg | ORAL_TABLET | Freq: Once | ORAL | Status: AC
  Administered 2012-03-21: 650 mg via ORAL

## 2012-03-21 MED ORDER — DIPHENHYDRAMINE HCL 25 MG PO CAPS
50.0000 mg | ORAL_CAPSULE | Freq: Once | ORAL | Status: AC
  Administered 2012-03-21: 50 mg via ORAL

## 2012-03-21 MED ORDER — SODIUM CHLORIDE 0.9 % IV SOLN: Freq: Once | INTRAVENOUS | Status: DC

## 2012-03-21 MED ORDER — MORPHINE SULFATE 4 MG/ML IJ SOLN
2.5000 mg | Freq: Once | INTRAMUSCULAR | Status: AC
  Administered 2012-03-21: 2.5 mg via INTRAVENOUS

## 2012-03-21 MED ORDER — LORAZEPAM 2 MG/ML IJ SOLN
1.0000 mg | Freq: Once | INTRAMUSCULAR | Status: AC
  Administered 2012-03-21: 1 mg via INTRAVENOUS

## 2012-03-21 NOTE — Progress Notes (Signed)
Hematology and Oncology Follow Up Visit  [name] [id number] 23-Mar-1951 61 y.o. 03/21/12  HPI: A 61 year old [name], [place], woman with a history of metastatic HER-2 positive breast carcinoma originally diagnosed September 2004, on maintenance q.3-week Herceptin, due for next dosing today.  2. 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   3. History of chemotherapy-induced neuropathy.  On Neurontin 600 mg p.o. T.i.d.   4. History of hypertension, managed by Dr. Sudie Bailey.    Interim History:   [name] is seen today with her husband in accompaniment for followup prior to her next every 3 week dose of maintenance Herceptin. She denies any new complaints, she still has ongoing issues with pain, but utilizes MS Contin appropriately. She also has history of chemotherapy-induced neuropathy and utilizes Neurontin 600 mg by mouth 3 times a day. She denies any fevers, chills, or night sweats. No increased shortness of breath or unusual episodes of chest pain. Her appetite has been fine, no persistent issues with nausea, emesis diarrhea or constipation.  Review of systems: Constitutional:  no weight loss, fever, night sweats and feels well Eyes: uses glasses ENT: No complaints Cardiovascular: no chest pain or dyspnea on exertion Respiratory: no cough, shortness of breath, she does have evidence of wheezing. Neurological: no TIA or stroke symptoms Dermatological: negative Gastrointestinal: no abdominal pain, change in bowel habits, or black or bloody stools Genito-Urinary: no dysuria, trouble voiding, or hematuria Hematological and Lymphatic: negative Breast: negative Musculoskeletal: positive for - joint pain, chronic B knees Remaining ROS negative.   Medications:   I have reviewed the patient's current medications.  Current Outpatient Prescriptions  Medication Sig Dispense Refill  . albuterol-ipratropium (COMBIVENT) 18-103 MCG/ACT inhaler Inhale 2 puffs  into the lungs every 6 (six) hours as needed for wheezing.  1 Inhaler  0  . ALPRAZolam (XANAX) 1 MG tablet Take 1-2 tablets (1-2 mg total) by mouth 3 (three) times daily as needed. anxiety  90 tablet  0  . AmLODIPine Besylate (NORVASC PO) Take 5 mg by mouth every morning.       . cyclobenzaprine (FLEXERIL) 10 MG tablet TAKE 1 TABLET BY MOUTH 3 TIMES A DAY AS NEEDED FOR MUSCLE SPASM  90 tablet  2  . furosemide (LASIX) 80 MG tablet Take 40 mg by mouth every morning.       . gabapentin (NEURONTIN) 300 MG capsule TAKE 2 CAPSULES BY MOUTH 3 TIMES A DAY  180 capsule  0  . losartan (COZAAR) 100 MG tablet Take 100 mg by mouth every morning.       Marland Kitchen morphine (MS CONTIN) 60 MG 12 hr tablet Take 60 mg by mouth 2 (two) times daily.      Marland Kitchen morphine (MSIR) 15 MG tablet Take 15 mg by mouth every 4 (four) hours as needed.      . Potassium Chloride (K-TABS PO) Take 40 mEq by mouth daily.       . prochlorperazine (COMPAZINE) 10 MG tablet Take 10 mg by mouth every 6 (six) hours as needed. nausea      . warfarin (COUMADIN) 2 MG tablet TAKE 1 TABLET BY MOUTH EVERY DAY AT 4 PM  30 tablet  0  . warfarin (COUMADIN) 5 MG tablet TAKE 1 TABLET BY MOUTH EVERY DAY AS DIRECTED  30 tablet  2   No current facility-administered medications for this visit.   Facility-Administered Medications Ordered in Other Visits  Medication Dose Route Frequency Provider Last Rate Last Dose  . amLODipine (  NORVASC) tablet 5 mg  5 mg Oral Daily Amada Kingfisher, PA        Allergies:  Allergies  Allergen Reactions  . Adhesive (Tape) Other (See Comments)    Tears skin   . Penicillins Hives    Physical Exam: Filed Vitals:   03/21/12 0901  BP: 183/112  Pulse: 73  Temp: 98.1 F (36.7 C)  Weight: 290 lbs. HEENT:  Sclerae anicteric, conjunctivae pink.  Oropharynx clear.  No mucositis or candidiasis.   Nodes:  No cervical, supraclavicular, or axillary lymphadenopathy palpated.  Lungs:  Clear to auscultation bilaterally.  No  crackles, rhonchi, or wheezes.   Heart:  Regular rate and rhythm.    Lab Results: Lab Results  Component Value Date   WBC 9.1 02/29/2012   HGB 12.2 02/29/2012   HCT 34.9 02/29/2012   MCV 76.0* 02/29/2012   PLT 230 02/29/2012   NEUTROABS 6.0 02/29/2012     Chemistry      Component Value Date/Time   NA 140 02/08/2012 0803   K 3.3* 02/08/2012 0803   CL 101 02/08/2012 0803   CO2 28 02/08/2012 0803   BUN 18 02/08/2012 0803   CREATININE 0.92 02/08/2012 0803      Component Value Date/Time   CALCIUM 8.9 02/08/2012 0803   ALKPHOS 85 02/08/2012 0803   AST 21 02/08/2012 0803   ALT 22 02/08/2012 0803   BILITOT 0.8 02/08/2012 0803       Assessment:  A 61 year old , [place], woman with a history of metastatic HER-2 positive breast carcinoma originally diagnosed September 2004, on maintenance q.3-week Herceptin.   2. 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   3. History of chemotherapy-induced neuropathy.  Continues on Neurontin 600 mg p.o. t.i.d.  4. History of hypertension, managed by Dr. Sudie Bailey.    Case reviewed with Dr. Pierce Crane.  Plan:  [name] will receive Herceptin today as scheduled, and return on 04/11/12 for follow up prior to her next Herceptin dosing.This plan was reviewed with the patient, who voices understanding and agreement.  She knows to call with any changes or problems.   Jaquanna Ballentine T, PA-C 03/21/12

## 2012-03-21 NOTE — Progress Notes (Signed)
C/o pain rt medial rib area, described as cramping, rates "8", states this is chronic pain.  Notified cristene scheer with orders for morphine 2.5mg  now and may repeat x1 if needed.  Pt given 2.5mg  and at 1045 states pain is" much better".  dmr

## 2012-03-21 NOTE — Patient Instructions (Addendum)
[facility] Discharge Instructions for Patients Receiving Chemotherapy  Today you received the following chemotherapy agents hercdeptin  To help prevent nausea and vomiting after your treatment, we encourage you to take your nausea medication   and take it as often as prescribed   If you develop nausea and vomiting that is not controlled by your nausea medication, call the clinic. If it is after clinic hours your family physician or the after hours number for the clinic or go to the Emergency Department.   BELOW ARE SYMPTOMS THAT SHOULD BE REPORTED IMMEDIATELY:  *FEVER GREATER THAN 100.5 F  *CHILLS WITH OR WITHOUT FEVER  NAUSEA AND VOMITING THAT IS NOT CONTROLLED WITH YOUR NAUSEA MEDICATION  *UNUSUAL SHORTNESS OF BREATH  *UNUSUAL BRUISING OR BLEEDING  TENDERNESS IN MOUTH AND THROAT WITH OR WITHOUT PRESENCE OF ULCERS  *URINARY PROBLEMS  *BOWEL PROBLEMS  UNUSUAL RASH Items with * indicate a potential emergency and should be followed up as soon as possible.  One of the nurses will contact you 24 hours after your treatment. Please let the nurse know about any problems that you may have experienced. Feel free to call the clinic you have any questions or concerns. The clinic phone number is [phone].   I have been informed and understand all the instructions given to me. I know to contact the clinic, my physician, or go to the Emergency Department if any problems should occur. I do not have any questions at this time, but understand that I may call the clinic during office hours   should I have any questions or need assistance in obtaining follow up care.    __________________________________________  _____________  __________ Signature of Patient or Authorized Representative            Date                   Time    __________________________________________ Nurse's Signature

## 2012-03-31 ENCOUNTER — Other Ambulatory Visit: Payer: Self-pay | Admitting: Physician Assistant

## 2012-03-31 ENCOUNTER — Other Ambulatory Visit: Payer: Self-pay | Admitting: Oncology

## 2012-04-10 ENCOUNTER — Other Ambulatory Visit: Payer: Self-pay | Admitting: Family

## 2012-04-11 ENCOUNTER — Ambulatory Visit (HOSPITAL_BASED_OUTPATIENT_CLINIC_OR_DEPARTMENT_OTHER): Payer: Medicare Other

## 2012-04-11 ENCOUNTER — Encounter: Payer: Self-pay | Admitting: Family

## 2012-04-11 ENCOUNTER — Ambulatory Visit (HOSPITAL_BASED_OUTPATIENT_CLINIC_OR_DEPARTMENT_OTHER): Payer: Medicare Other | Admitting: Family

## 2012-04-11 ENCOUNTER — Other Ambulatory Visit (HOSPITAL_BASED_OUTPATIENT_CLINIC_OR_DEPARTMENT_OTHER): Payer: Medicare Other | Admitting: Lab

## 2012-04-11 ENCOUNTER — Other Ambulatory Visit: Payer: Self-pay | Admitting: *Deleted

## 2012-04-11 ENCOUNTER — Telehealth: Payer: Self-pay | Admitting: Oncology

## 2012-04-11 VITALS — BP 150/84 | HR 96 | Temp 98.9°F | Resp 20 | Ht 63.0 in | Wt 292.4 lb

## 2012-04-11 DIAGNOSIS — C50919 Malignant neoplasm of unspecified site of unspecified female breast: Secondary | ICD-10-CM

## 2012-04-11 DIAGNOSIS — I871 Compression of vein: Secondary | ICD-10-CM

## 2012-04-11 DIAGNOSIS — Z7901 Long term (current) use of anticoagulants: Secondary | ICD-10-CM

## 2012-04-11 DIAGNOSIS — C773 Secondary and unspecified malignant neoplasm of axilla and upper limb lymph nodes: Secondary | ICD-10-CM

## 2012-04-11 DIAGNOSIS — D702 Other drug-induced agranulocytosis: Secondary | ICD-10-CM

## 2012-04-11 DIAGNOSIS — Z5112 Encounter for antineoplastic immunotherapy: Secondary | ICD-10-CM

## 2012-04-11 LAB — CBC WITH DIFFERENTIAL/PLATELET
Basophils Absolute: 0 10*3/uL (ref 0.0–0.1)
HCT: 32.3 % — ABNORMAL LOW (ref 34.8–46.6)
HGB: 11.3 g/dL — ABNORMAL LOW (ref 11.6–15.9)
MONO#: 0.7 10*3/uL (ref 0.1–0.9)
NEUT#: 4.8 10*3/uL (ref 1.5–6.5)
NEUT%: 52 % (ref 38.4–76.8)
RDW: 16 % — ABNORMAL HIGH (ref 11.2–14.5)
WBC: 9.2 10*3/uL (ref 3.9–10.3)
lymph#: 3.3 10*3/uL (ref 0.9–3.3)

## 2012-04-11 LAB — COMPREHENSIVE METABOLIC PANEL
ALT: 14 U/L (ref 0–35)
AST: 19 U/L (ref 0–37)
Alkaline Phosphatase: 87 U/L (ref 39–117)
BUN: 25 mg/dL — ABNORMAL HIGH (ref 6–23)
Calcium: 8.7 mg/dL (ref 8.4–10.5)
Chloride: 101 mEq/L (ref 96–112)
Creatinine, Ser: 1.12 mg/dL — ABNORMAL HIGH (ref 0.50–1.10)
Potassium: 3.8 mEq/L (ref 3.5–5.3)

## 2012-04-11 LAB — PROTIME-INR
INR: 1.8 — ABNORMAL LOW (ref 2.00–3.50)
Protime: 21.6 Seconds — ABNORMAL HIGH (ref 10.6–13.4)

## 2012-04-11 LAB — CANCER ANTIGEN 27.29: CA 27.29: 23 U/mL (ref 0–39)

## 2012-04-11 MED ORDER — SODIUM CHLORIDE 0.9 % IV SOLN
Freq: Once | INTRAVENOUS | Status: AC
  Administered 2012-04-11: 10:00:00 via INTRAVENOUS

## 2012-04-11 MED ORDER — WARFARIN SODIUM 2.5 MG PO TABS: 2.5000 mg | ORAL_TABLET | Freq: Every day | ORAL | Status: DC

## 2012-04-11 MED ORDER — ALPRAZOLAM 1 MG PO TABS: 1.0000 mg | ORAL_TABLET | Freq: Three times a day (TID) | ORAL | Status: DC | PRN

## 2012-04-11 MED ORDER — HEPARIN SOD (PORK) LOCK FLUSH 100 UNIT/ML IV SOLN
250.0000 [IU] | Freq: Once | INTRAVENOUS | Status: AC | PRN
  Administered 2012-04-11: 250 [IU]
  Filled 2012-04-11: qty 5

## 2012-04-11 MED ORDER — DIPHENHYDRAMINE HCL 25 MG PO CAPS
50.0000 mg | ORAL_CAPSULE | Freq: Once | ORAL | Status: AC
  Administered 2012-04-11: 50 mg via ORAL

## 2012-04-11 MED ORDER — ACETAMINOPHEN 325 MG PO TABS
650.0000 mg | ORAL_TABLET | Freq: Once | ORAL | Status: AC
  Administered 2012-04-11: 650 mg via ORAL

## 2012-04-11 MED ORDER — SODIUM CHLORIDE 0.9 % IJ SOLN
10.0000 mL | INTRAMUSCULAR | Status: DC | PRN
  Administered 2012-04-11: 10 mL
  Filled 2012-04-11: qty 10

## 2012-04-11 MED ORDER — TRASTUZUMAB CHEMO INJECTION 440 MG
6.0000 mg/kg | Freq: Once | INTRAVENOUS | Status: AC
  Administered 2012-04-11: 777 mg via INTRAVENOUS
  Filled 2012-04-11: qty 37

## 2012-04-11 MED ORDER — MORPHINE SULFATE ER 60 MG PO TBCR: 60.0000 mg | EXTENDED_RELEASE_TABLET | Freq: Two times a day (BID) | ORAL | Status: DC

## 2012-04-11 NOTE — Progress Notes (Signed)
Hematology and Oncology Follow Up Visit  AZAIAH MELLO [id number] 10-08-1950 61 y.o. 03/21/12  HPI: A 61 year old [place], [place], woman with a history of metastatic HER-2 positive breast carcinoma originally diagnosed September 2004, on maintenance q.3-week Herceptin, due for next dosing today.  2. 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   3. History of chemotherapy-induced neuropathy.  On Neurontin 600 mg p.o. T.i.d.   4. History of hypertension, managed by Dr. Sudie Bailey.   Interim History:   [name] is seen today with her husband in accompaniment for followup prior to her next every 3 week dose of maintenance Herceptin. She denies any new complaints, she still has ongoing issues with pain, but utilizes MS Contin appropriately. She also has history of chemotherapy-induced neuropathy and utilizes Neurontin 600 mg by mouth 3 times a day. Also takes Xanax for what she calls "temper tantrums". She denies fevers, chills, or night sweats. No increased shortness of breath or chest pain. Appetite has been good, she says she eats only one meal a day and wonders why she cannot lose weight.  No nausea, emesis diarrhea or constipation. No self detected problems with the breast.   INR was subtherapeutic on last visit. Currently on coumadin 7 mg daily.   Medications:   I have reviewed the patient's current medications.  Allergies:  Allergies  Allergen Reactions  . Adhesive (Tape) Other (See Comments)    Tears skin   . Penicillins Hives    Physical Exam: Filed Vitals:   04/11/12 0915  BP: 150/84  Pulse: 96  Temp: 98.9 F (37.2 C)  Resp: 20   General: Well developed, well nourished, in no acute distress.  EENT: No ocular or oral lesions. No stomatitis.  Respiratory: Lungs are clear to auscultation bilaterally with normal respiratory movement and no accessory muscle use. Cardiac: No murmur, rub or tachycardia. No upper or lower extremity edema.  GI: Abdomen  is soft, no palpable hepatosplenomegaly. No fluid wave. No tenderness. Musculoskeletal: No kyphosis, no tenderness over the spine, ribs or hips. Lymph: No cervical, infraclavicular, axillary or inguinal adenopathy. Neuro: No focal neurological deficits. Psych: Alert and oriented X 3, appropriate mood and affect.   Lab Results: Lab Results  Component Value Date   WBC 9.2 04/11/2012   HGB 11.3* 04/11/2012   HCT 32.3* 04/11/2012   MCV 76.0* 04/11/2012   PLT 216 04/11/2012   NEUTROABS 4.8 04/11/2012  INR 1.8   Assessment:  A 61 year old [place], [place], woman with a history of metastatic HER-2 positive breast carcinoma originally diagnosed September 2004, on maintenance q.3-week Herceptin.  2. History of superior vena caval syndrome on Coumadin. INR subtherapeutic.  3. History of chemotherapy-induced neuropathy.  Continues on Neurontin 600 mg p.o. t.i.d. 4. History of hypertension, managed by Dr. Sudie Bailey.  5. Generalized anxiety disorder, disruptive by her admission.  6. Last echo 01/17/12.    Plan:  1. Herceptin today as scheduled. 2. Return on 05/02/12 for follow up prior to next Herceptin dosing. 3. Increase Coumadin to 7.5 mg daily. Will recheck with next Herceptin infusion.  4. Refill MS Contin. RX is e-scribed, printed and given to pt.  5. New prescription for Coumadin 2.5mg  to take with 5 mg tab to total 7.5 mg daily.  6. Refill Xanax, to be filled 04/19/12.   This plan was reviewed with the patient, who voices understanding and agreement.  She knows to call with any changes oroblems.   Colman Cater, FNP-C 03/21/12

## 2012-04-11 NOTE — Patient Instructions (Addendum)
[facility] Discharge Instructions for Patients Receiving Chemotherapy  Today you received the following chemotherapy agents Herceptin  To help prevent nausea and vomiting after your treatment, we encourage you to take your nausea medication Begin taking it at 7 pm and take it as often as prescribed for the next 24 to 72 hours.   If you develop nausea and vomiting that is not controlled by your nausea medication, call the clinic. If it is after clinic hours your family physician or the after hours number for the clinic or go to the Emergency Department.   BELOW ARE SYMPTOMS THAT SHOULD BE REPORTED IMMEDIATELY:  *FEVER GREATER THAN 100.5 F  *CHILLS WITH OR WITHOUT FEVER  NAUSEA AND VOMITING THAT IS NOT CONTROLLED WITH YOUR NAUSEA MEDICATION  *UNUSUAL SHORTNESS OF BREATH  *UNUSUAL BRUISING OR BLEEDING  TENDERNESS IN MOUTH AND THROAT WITH OR WITHOUT PRESENCE OF ULCERS  *URINARY PROBLEMS  *BOWEL PROBLEMS  UNUSUAL RASH Items with * indicate a potential emergency and should be followed up as soon as possible.  One of the nurses will contact you 24 hours after your treatment. Please let the nurse know about any problems that you may have experienced. Feel free to call the clinic you have any questions or concerns. The clinic phone number is [phone].   I have been informed and understand all the instructions given to me. I know to contact the clinic, my physician, or go to the Emergency Department if any problems should occur. I do not have any questions at this time, but understand that I may call the clinic during office hours   should I have any questions or need assistance in obtaining follow up care.    __________________________________________  _____________  __________ Signature of Patient or Authorized Representative            Date                   Time    __________________________________________ Nurse's Signature    

## 2012-04-11 NOTE — Telephone Encounter (Signed)
x

## 2012-04-11 NOTE — Telephone Encounter (Signed)
gve the pt her sept 2013 appt calendar. Pt is aware that we will call her with the echo appt

## 2012-04-11 NOTE — Patient Instructions (Signed)
Return to clinic in 3 weeks for next Herceptin treatment.

## 2012-04-17 ENCOUNTER — Other Ambulatory Visit: Payer: Self-pay | Admitting: Physician Assistant

## 2012-04-17 DIAGNOSIS — C50919 Malignant neoplasm of unspecified site of unspecified female breast: Secondary | ICD-10-CM

## 2012-04-18 ENCOUNTER — Telehealth: Payer: Self-pay | Admitting: Oncology

## 2012-04-18 NOTE — Telephone Encounter (Signed)
S/w the pt and she is aware of her echo appt in aug

## 2012-04-23 ENCOUNTER — Ambulatory Visit (HOSPITAL_COMMUNITY)
Admission: RE | Admit: 2012-04-23 | Discharge: 2012-04-23 | Disposition: A | Discharge status: Home / Self Care | Payer: Medicare Other | Source: Ambulatory Visit | Attending: Oncology | Admitting: Oncology

## 2012-04-23 DIAGNOSIS — Z09 Encounter for follow-up examination after completed treatment for conditions other than malignant neoplasm: Secondary | ICD-10-CM

## 2012-04-23 DIAGNOSIS — Z79899 Other long term (current) drug therapy: Secondary | ICD-10-CM | POA: Insufficient documentation

## 2012-04-23 DIAGNOSIS — C50919 Malignant neoplasm of unspecified site of unspecified female breast: Secondary | ICD-10-CM | POA: Insufficient documentation

## 2012-04-23 DIAGNOSIS — I10 Essential (primary) hypertension: Secondary | ICD-10-CM | POA: Insufficient documentation

## 2012-04-23 NOTE — Progress Notes (Signed)
  Echocardiogram 2D Echocardiogram Limited  has been performed.  [name] 04/23/2012, 8:50 AM

## 2012-05-02 ENCOUNTER — Telehealth: Payer: Self-pay | Admitting: Oncology

## 2012-05-02 ENCOUNTER — Other Ambulatory Visit: Payer: Self-pay | Admitting: *Deleted

## 2012-05-02 ENCOUNTER — Other Ambulatory Visit: Payer: Self-pay | Admitting: Oncology

## 2012-05-02 ENCOUNTER — Ambulatory Visit (HOSPITAL_BASED_OUTPATIENT_CLINIC_OR_DEPARTMENT_OTHER): Payer: Medicare Other | Admitting: Family

## 2012-05-02 ENCOUNTER — Ambulatory Visit (HOSPITAL_BASED_OUTPATIENT_CLINIC_OR_DEPARTMENT_OTHER): Payer: Medicare Other

## 2012-05-02 ENCOUNTER — Other Ambulatory Visit (HOSPITAL_BASED_OUTPATIENT_CLINIC_OR_DEPARTMENT_OTHER): Payer: Medicare Other | Admitting: Lab

## 2012-05-02 ENCOUNTER — Encounter: Payer: Self-pay | Admitting: Family

## 2012-05-02 VITALS — BP 186/118 | HR 67 | Temp 97.7°F | Resp 20 | Ht 63.0 in | Wt 293.3 lb

## 2012-05-02 DIAGNOSIS — C50219 Malignant neoplasm of upper-inner quadrant of unspecified female breast: Secondary | ICD-10-CM

## 2012-05-02 DIAGNOSIS — F411 Generalized anxiety disorder: Secondary | ICD-10-CM

## 2012-05-02 DIAGNOSIS — C50919 Malignant neoplasm of unspecified site of unspecified female breast: Secondary | ICD-10-CM

## 2012-05-02 DIAGNOSIS — D702 Other drug-induced agranulocytosis: Secondary | ICD-10-CM

## 2012-05-02 DIAGNOSIS — Z170 Estrogen receptor positive status [ER+]: Secondary | ICD-10-CM

## 2012-05-02 DIAGNOSIS — I82409 Acute embolism and thrombosis of unspecified deep veins of unspecified lower extremity: Secondary | ICD-10-CM

## 2012-05-02 DIAGNOSIS — Z5112 Encounter for antineoplastic immunotherapy: Secondary | ICD-10-CM

## 2012-05-02 LAB — COMPREHENSIVE METABOLIC PANEL (CC13)
AST: 11 U/L (ref 5–34)
Albumin: 3.2 g/dL — ABNORMAL LOW (ref 3.5–5.0)
Alkaline Phosphatase: 82 U/L (ref 40–150)
BUN: 13 mg/dL (ref 7.0–26.0)
Creatinine: 0.8 mg/dL (ref 0.6–1.1)
Potassium: 3.7 mEq/L (ref 3.5–5.1)

## 2012-05-02 MED ORDER — SODIUM CHLORIDE 0.9 % IV SOLN
6.0000 mg/kg | Freq: Once | INTRAVENOUS | Status: AC
  Administered 2012-05-02: 777 mg via INTRAVENOUS
  Filled 2012-05-02: qty 37

## 2012-05-02 MED ORDER — SODIUM CHLORIDE 0.9 % IV SOLN
Freq: Once | INTRAVENOUS | Status: AC
  Administered 2012-05-02: 10:00:00 via INTRAVENOUS

## 2012-05-02 MED ORDER — ACETAMINOPHEN 325 MG PO TABS
650.0000 mg | ORAL_TABLET | Freq: Once | ORAL | Status: AC
  Administered 2012-05-02: 650 mg via ORAL

## 2012-05-02 MED ORDER — DIPHENHYDRAMINE HCL 25 MG PO CAPS
50.0000 mg | ORAL_CAPSULE | Freq: Once | ORAL | Status: AC
  Administered 2012-05-02: 50 mg via ORAL

## 2012-05-02 MED ORDER — LORAZEPAM 2 MG/ML IJ SOLN
1.0000 mg | Freq: Once | INTRAMUSCULAR | Status: AC
  Administered 2012-05-02: 1 mg via INTRAVENOUS

## 2012-05-02 MED ORDER — SODIUM CHLORIDE 0.9 % IJ SOLN
10.0000 mL | INTRAMUSCULAR | Status: DC | PRN
  Administered 2012-05-02: 10 mL
  Filled 2012-05-02: qty 10

## 2012-05-02 MED ORDER — HEPARIN SOD (PORK) LOCK FLUSH 100 UNIT/ML IV SOLN
500.0000 [IU] | Freq: Once | INTRAVENOUS | Status: AC | PRN
  Administered 2012-05-02: 250 [IU]
  Filled 2012-05-02: qty 5

## 2012-05-02 NOTE — Telephone Encounter (Signed)
Per staff message and POF I have scheduled appts.  JMW  

## 2012-05-02 NOTE — Telephone Encounter (Signed)
gve the pt's husband the pet scan appt. He is aware that we will contact them with the md appt

## 2012-05-02 NOTE — Progress Notes (Signed)
  Hematology and Oncology Follow Up Visit  [name] [id number] Apr 21, 1951 61 y.o.  HPI: A 61 year old [name], [place], woman with a history of metastatic HER-2 positive breast carcinoma originally diagnosed September 2004, on maintenance q.3-week Herceptin, due for next dosing today. 2. 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  3. History of chemotherapy-induced neuropathy.  On Neurontin 600 mg p.o. T.i.d.  4. History of hypertension, managed by Dr. Sudie Bailey.   Interim History:   [name] is seen today with her husband in accompaniment for followup prior to next every 3 week dose of maintenance Herceptin. She denies any new complaints, she still has ongoing issues with pain, but utilizes MS Contin appropriately. She also has history of chemotherapy-induced neuropathy and utilizes Neurontin 600 mg by mouth 3 times a day. Also takes Xanax for what she calls "temper tantrums". She denies fevers, chills, or night sweats. No increased shortness of breath or chest pain. Has occasional "muscle spasms" in the chest and back. Appetite has been good, she says she eats only one meal a day.No nausea, emesis diarrhea or constipation. No self detected problems with the breast.   Currently on coumadin 7.5 mg daily.   Medications:   I have reviewed the patient's current medications.  Allergies:  Allergies  Allergen Reactions  . Adhesive (Tape) Other (See Comments)    Tears skin   . Penicillins Hives    Physical Exam: Filed Vitals:   05/02/12 0855  BP: 186/118  Pulse: 67  Temp: 97.7 F (36.5 C)  Resp: 20   General: Well developed, well nourished, in no acute distress.  EENT: No ocular or oral lesions. No stomatitis.  Respiratory: Lungs are clear to auscultation bilaterally with normal respiratory movement and no accessory muscle use. Cardiac: No murmur, rub or tachycardia. No upper or lower extremity edema.  GI: Abdomen is soft, no palpable hepatosplenomegaly.  No fluid wave. No tenderness. Musculoskeletal: No kyphosis, no tenderness over the spine, ribs or hips. Lymph: No cervical, infraclavicular, axillary or inguinal adenopathy. Neuro: No focal neurological deficits. Psych: Alert and oriented X 3, appropriate mood and affect.   Lab Results: Lab Results  Component Value Date   WBC 9.2 04/11/2012   HGB 11.3* 04/11/2012   HCT 32.3* 04/11/2012   MCV 76.0* 04/11/2012   PLT 216 04/11/2012   NEUTROABS 4.8 04/11/2012     Assessment:  A 61 year old [name], [place], woman with a history of metastatic HER-2 positive breast carcinoma originally diagnosed September 2004, on maintenance q.3-week Herceptin.  2. History of superior vena caval syndrome on Coumadin. INR pending. 3. History of chemotherapy-induced neuropathy.  Continues on Neurontin 600 mg p.o. t.i.d. 4. History of hypertension, managed by Dr. Sudie Bailey.  5. Generalized anxiety disorder, disruptive outbursts by her admission.  6. Last echo 04/23/12, EF 60-65%. 7. Last PET was March 2013.    Plan:  1. Herceptin today as scheduled. 2. Return on 05/23/12 for follow up prior to next Herceptin dosing. 3. INR pending.  4. Restaging PET scan 05/21/12.   This plan was reviewed with the patient, who voices understanding and agreement.  She knows to call with any changes oroblems.   Breanna Shorkey, FNP-C

## 2012-05-02 NOTE — Patient Instructions (Addendum)
1. Herceptin today as scheduled. 2. Return on 05/23/12 for follow up prior to next Herceptin dosing. 3. INR pending.  4. Restaging PET scan 05/21/12.

## 2012-05-02 NOTE — Telephone Encounter (Signed)
Per NR, notified pt to hold coumadin tonight and tommrow night (05/02/12 and 05/03/12) and restart current dose 7.5mg  on Sunday 05/04/12. Will recheck Friday 05/09/12 at 10:00

## 2012-05-02 NOTE — Patient Instructions (Addendum)
[facility] Discharge Instructions for Patients Receiving Chemotherapy  Today you received the following chemotherapy agents Herceptin  To help prevent nausea and vomiting after your treatment, we encourage you to take your nausea medication as per Dr. Donnie Coffin. Begin taking it at 7 pm and take it as often as prescribed for the next 24 to 72 hours.   If you develop nausea and vomiting that is not controlled by your nausea medication, call the clinic. If it is after clinic hours your family physician or the after hours number for the clinic or go to the Emergency Department.   BELOW ARE SYMPTOMS THAT SHOULD BE REPORTED IMMEDIATELY:  *FEVER GREATER THAN 100.5 F  *CHILLS WITH OR WITHOUT FEVER  NAUSEA AND VOMITING THAT IS NOT CONTROLLED WITH YOUR NAUSEA MEDICATION  *UNUSUAL SHORTNESS OF BREATH  *UNUSUAL BRUISING OR BLEEDING  TENDERNESS IN MOUTH AND THROAT WITH OR WITHOUT PRESENCE OF ULCERS  *URINARY PROBLEMS  *BOWEL PROBLEMS  UNUSUAL RASH Items with * indicate a potential emergency and should be followed up as soon as possible.  One of the nurses will contact you 24 hours after your treatment. Please let the nurse know about any problems that you may have experienced. Feel free to call the clinic you have any questions or concerns. The clinic phone number is [phone].   I have been informed and understand all the instructions given to me. I know to contact the clinic, my physician, or go to the Emergency Department if any problems should occur. I do not have any questions at this time, but understand that I may call the clinic during office hours   should I have any questions or need assistance in obtaining follow up care.    __________________________________________  _____________  __________ Signature of Patient or Authorized Representative            Date                   Time    __________________________________________ Nurse's Signature

## 2012-05-02 NOTE — Progress Notes (Signed)
Patient saw Colman Cater 05/02/2012; CMET and PT/INR Ordered. Treatment Plan Dated 05/02/2012.

## 2012-05-02 NOTE — Progress Notes (Signed)
Rt. Chest wall SLPICC line dressing changed today.  Area clean and dry and sutures remain intact. Saline and heparin 250 units instilled into PICC.  Denied discomfort to site and tolerated procedure well.    Pt.'s husband will continue PICC dressing at home once weekly.  Val Dodd assisting with Dr. Donnie Coffin at this time and will have supplies call in to [facility] in [place] as per pt's request.  INR 3.24 done today.  Colman Cater PA notified and she will adjust coumadin dosing accordingly and call pt. At home.  Pt. did not wish to wait in infusion to dose adjustment.  HL

## 2012-05-08 ENCOUNTER — Telehealth: Payer: Self-pay | Admitting: *Deleted

## 2012-05-08 NOTE — Telephone Encounter (Signed)
Patient confirmed over the phone the new date and time on 05-09-2012 9:45am  Lab only

## 2012-05-09 ENCOUNTER — Encounter: Payer: Self-pay | Admitting: *Deleted

## 2012-05-09 ENCOUNTER — Telehealth: Payer: Self-pay | Admitting: *Deleted

## 2012-05-09 ENCOUNTER — Other Ambulatory Visit (HOSPITAL_BASED_OUTPATIENT_CLINIC_OR_DEPARTMENT_OTHER): Payer: Medicare Other | Admitting: Lab

## 2012-05-09 DIAGNOSIS — I82409 Acute embolism and thrombosis of unspecified deep veins of unspecified lower extremity: Secondary | ICD-10-CM

## 2012-05-09 LAB — PROTHROMBIN TIME
INR: 4.18 — ABNORMAL HIGH (ref <1.50)
Prothrombin Time: 41 seconds — ABNORMAL HIGH (ref 11.6–15.2)

## 2012-05-09 LAB — PROTIME-INR

## 2012-05-09 NOTE — Telephone Encounter (Signed)
LATE ENTRY: 05/02/12 PER NR NOTIFIED PT TO STOP COUMADIN Friday (6TH) AND SAT (7TH) RESUME COUMADIN 7.5MG  ON Sunday [facility]) RECHECK 05/09/12. PER MD HAVE NOTIFIED PT TO TAKE COUMADIN 5MG  DAILY AND RECHECK LABS 05/22/12

## 2012-05-16 ENCOUNTER — Other Ambulatory Visit: Payer: Self-pay | Admitting: Emergency Medicine

## 2012-05-16 DIAGNOSIS — C50919 Malignant neoplasm of unspecified site of unspecified female breast: Secondary | ICD-10-CM

## 2012-05-16 MED ORDER — ALPRAZOLAM 1 MG PO TABS: 1.0000 mg | ORAL_TABLET | Freq: Three times a day (TID) | ORAL | Status: DC | PRN

## 2012-05-20 ENCOUNTER — Other Ambulatory Visit: Payer: Self-pay | Admitting: *Deleted

## 2012-05-20 ENCOUNTER — Telehealth: Payer: Self-pay | Admitting: *Deleted

## 2012-05-20 DIAGNOSIS — C50919 Malignant neoplasm of unspecified site of unspecified female breast: Secondary | ICD-10-CM

## 2012-05-20 DIAGNOSIS — I82409 Acute embolism and thrombosis of unspecified deep veins of unspecified lower extremity: Secondary | ICD-10-CM

## 2012-05-20 NOTE — Telephone Encounter (Signed)
Per desk RN I have scheduled appts from 9/26 to 9/25. Desk RN to call patient. JMW

## 2012-05-21 ENCOUNTER — Encounter: Payer: Self-pay | Admitting: *Deleted

## 2012-05-21 ENCOUNTER — Telehealth: Payer: Self-pay | Admitting: *Deleted

## 2012-05-21 ENCOUNTER — Encounter (HOSPITAL_COMMUNITY): Payer: Self-pay

## 2012-05-21 ENCOUNTER — Ambulatory Visit (HOSPITAL_BASED_OUTPATIENT_CLINIC_OR_DEPARTMENT_OTHER): Payer: Medicare Other | Admitting: Oncology

## 2012-05-21 ENCOUNTER — Ambulatory Visit (HOSPITAL_BASED_OUTPATIENT_CLINIC_OR_DEPARTMENT_OTHER): Payer: Medicare Other

## 2012-05-21 ENCOUNTER — Other Ambulatory Visit (HOSPITAL_BASED_OUTPATIENT_CLINIC_OR_DEPARTMENT_OTHER): Payer: Medicare Other

## 2012-05-21 ENCOUNTER — Encounter (HOSPITAL_COMMUNITY)
Admission: RE | Admit: 2012-05-21 | Discharge: 2012-05-21 | Disposition: A | Discharge status: Home / Self Care | Payer: Medicare Other | Source: Ambulatory Visit | Attending: Family | Admitting: Family

## 2012-05-21 VITALS — BP 147/87 | HR 87 | Temp 97.9°F | Resp 20 | Ht 63.0 in | Wt 296.0 lb

## 2012-05-21 DIAGNOSIS — Z5112 Encounter for antineoplastic immunotherapy: Secondary | ICD-10-CM

## 2012-05-21 DIAGNOSIS — Z170 Estrogen receptor positive status [ER+]: Secondary | ICD-10-CM

## 2012-05-21 DIAGNOSIS — C50219 Malignant neoplasm of upper-inner quadrant of unspecified female breast: Secondary | ICD-10-CM

## 2012-05-21 DIAGNOSIS — C50919 Malignant neoplasm of unspecified site of unspecified female breast: Secondary | ICD-10-CM

## 2012-05-21 DIAGNOSIS — G622 Polyneuropathy due to other toxic agents: Secondary | ICD-10-CM

## 2012-05-21 DIAGNOSIS — I82409 Acute embolism and thrombosis of unspecified deep veins of unspecified lower extremity: Secondary | ICD-10-CM

## 2012-05-21 DIAGNOSIS — D1779 Benign lipomatous neoplasm of other sites: Secondary | ICD-10-CM | POA: Insufficient documentation

## 2012-05-21 DIAGNOSIS — I517 Cardiomegaly: Secondary | ICD-10-CM | POA: Insufficient documentation

## 2012-05-21 DIAGNOSIS — K449 Diaphragmatic hernia without obstruction or gangrene: Secondary | ICD-10-CM | POA: Insufficient documentation

## 2012-05-21 DIAGNOSIS — I871 Compression of vein: Secondary | ICD-10-CM

## 2012-05-21 DIAGNOSIS — Z9089 Acquired absence of other organs: Secondary | ICD-10-CM | POA: Insufficient documentation

## 2012-05-21 LAB — CBC WITH DIFFERENTIAL/PLATELET
BASO%: 0.3 % (ref 0.0–2.0)
LYMPH%: 36.3 % (ref 14.0–49.7)
MCHC: 35 g/dL (ref 31.5–36.0)
MCV: 76.9 fL — ABNORMAL LOW (ref 79.5–101.0)
MONO%: 8.2 % (ref 0.0–14.0)
Platelets: 202 10*3/uL (ref 145–400)
RBC: 4.2 10*6/uL (ref 3.70–5.45)
RDW: 16.2 % — ABNORMAL HIGH (ref 11.2–14.5)
WBC: 10.1 10*3/uL (ref 3.9–10.3)
nRBC: 0 % (ref 0–0)

## 2012-05-21 LAB — COMPREHENSIVE METABOLIC PANEL (CC13)
ALT: 12 U/L (ref 0–55)
AST: 12 U/L (ref 5–34)
Alkaline Phosphatase: 92 U/L (ref 40–150)
Creatinine: 1.8 mg/dL — ABNORMAL HIGH (ref 0.6–1.1)
Sodium: 141 mEq/L (ref 136–145)
Total Bilirubin: 0.7 mg/dL (ref 0.20–1.20)

## 2012-05-21 MED ORDER — ACETAMINOPHEN 325 MG PO TABS
650.0000 mg | ORAL_TABLET | Freq: Once | ORAL | Status: AC
  Administered 2012-05-21: 650 mg via ORAL

## 2012-05-21 MED ORDER — MORPHINE SULFATE ER 60 MG PO TBCR: 60.0000 mg | EXTENDED_RELEASE_TABLET | Freq: Two times a day (BID) | ORAL | Status: DC

## 2012-05-21 MED ORDER — FLUDEOXYGLUCOSE F - 18 (FDG) INJECTION
19.2000 | Freq: Once | INTRAVENOUS | Status: AC | PRN
  Administered 2012-05-21: 19.2 via INTRAVENOUS

## 2012-05-21 MED ORDER — LORAZEPAM 2 MG/ML IJ SOLN
1.0000 mg | Freq: Once | INTRAMUSCULAR | Status: AC
Start: 2012-05-21 — End: 2012-05-21
  Administered 2012-05-21: 1 mg via INTRAVENOUS

## 2012-05-21 MED ORDER — MORPHINE SULFATE 15 MG PO TABS: 15.0000 mg | ORAL_TABLET | ORAL | Status: DC | PRN

## 2012-05-21 MED ORDER — SODIUM CHLORIDE 0.9 % IJ SOLN
10.0000 mL | INTRAMUSCULAR | Status: DC | PRN
  Administered 2012-05-21: 10 mL
  Filled 2012-05-21: qty 10

## 2012-05-21 MED ORDER — DIPHENHYDRAMINE HCL 25 MG PO CAPS
50.0000 mg | ORAL_CAPSULE | Freq: Once | ORAL | Status: AC
  Administered 2012-05-21: 50 mg via ORAL

## 2012-05-21 MED ORDER — TRASTUZUMAB CHEMO INJECTION 440 MG
6.0000 mg/kg | Freq: Once | INTRAVENOUS | Status: AC
  Administered 2012-05-21: 798 mg via INTRAVENOUS
  Filled 2012-05-21: qty 38

## 2012-05-21 MED ORDER — SODIUM CHLORIDE 0.9 % IV SOLN
Freq: Once | INTRAVENOUS | Status: AC
  Administered 2012-05-21: 12:00:00 via INTRAVENOUS

## 2012-05-21 MED ORDER — HEPARIN SOD (PORK) LOCK FLUSH 100 UNIT/ML IV SOLN
500.0000 [IU] | Freq: Once | INTRAVENOUS | Status: AC | PRN
  Administered 2012-05-21: 500 [IU]
  Filled 2012-05-21: qty 5

## 2012-05-21 NOTE — Patient Instructions (Addendum)
[facility] Discharge Instructions for Patients Receiving Chemotherapy  Today you received the following chemotherapy agents Herceptin To help prevent nausea and vomiting after your treatment, we encourage you to take your nausea medication as directed  If you develop nausea and vomiting that is not controlled by your nausea medication, call the clinic. If it is after clinic hours your family physician or the after hours number for the clinic or go to the Emergency Department.   BELOW ARE SYMPTOMS THAT SHOULD BE REPORTED IMMEDIATELY:  *FEVER GREATER THAN 100.5 F  *CHILLS WITH OR WITHOUT FEVER  NAUSEA AND VOMITING THAT IS NOT CONTROLLED WITH YOUR NAUSEA MEDICATION  *UNUSUAL SHORTNESS OF BREATH  *UNUSUAL BRUISING OR BLEEDING  TENDERNESS IN MOUTH AND THROAT WITH OR WITHOUT PRESENCE OF ULCERS  *URINARY PROBLEMS  *BOWEL PROBLEMS  UNUSUAL RASH Items with * indicate a potential emergency and should be followed up as soon as possible.  One of the nurses will contact you 24 hours after your treatment. Please let the nurse know about any problems that you may have experienced. Feel free to call the clinic you have any questions or concerns. The clinic phone number is [phone].   I have been informed and understand all the instructions given to me. I know to contact the clinic, my physician, or go to the Emergency Department if any problems should occur. I do not have any questions at this time, but understand that I may call the clinic during office hours   should I have any questions or need assistance in obtaining follow up care.    __________________________________________  _____________  __________ Signature of Patient or Authorized Representative            Date                   Time    __________________________________________ Nurse's Signature    

## 2012-05-21 NOTE — Progress Notes (Signed)
  Hematology and Oncology Follow Up Visit  CAITLIN AINLEY [id number] 1951/01/19 61 y.o.  HPI: A 61 year old [place], [place], woman with a history of metastatic HER-2 positive breast carcinoma originally diagnosed September 2004, on maintenance q.3-week Herceptin, due for next dosing today. 2. 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  3. History of chemotherapy-induced neuropathy.  On Neurontin 600 mg p.o. T.i.d.  4. History of hypertension, managed by Dr. Sudie Bailey.   Interim History:   [name] is seen today with her husband in accompaniment for followup prior to next every 3 week dose of maintenance Herceptin. She denies any new complaints, she still has ongoing issues with pain, but utilizes MS Contin appropriately. She also has history of chemotherapy-induced neuropathy and utilizes Neurontin 600 mg by mouth 3 times a day. Also takes Xanax for what she calls "temper tantrums". She denies fevers, chills, or night sweats. No increased shortness of breath or chest pain. Has occasional "muscle spasms" in the chest and back. Appetite has been good, she says she eats only one meal a day.No nausea, emesis diarrhea or constipation. No self detected problems with the breast.   Currently off coumadin   Medications:   I have reviewed the patient's current medications.  Allergies:  Allergies  Allergen Reactions  . Adhesive (Tape) Other (See Comments)    Tears skin   . Penicillins Hives    Physical Exam: Filed Vitals:   05/21/12 1046  BP: 147/87  Pulse: 87  Temp: 97.9 F (36.6 C)  Resp: 20   General: Well developed, well nourished, in no acute distress.  EENT: No ocular or oral lesions. No stomatitis.  Respiratory: Lungs are clear to auscultation bilaterally with normal respiratory movement and no accessory muscle use. Cardiac: No murmur, rub or tachycardia. No upper or lower extremity edema.  GI: Abdomen is soft, no palpable hepatosplenomegaly. No fluid wave.  No tenderness. Musculoskeletal: No kyphosis, no tenderness over the spine, ribs or hips. Lymph: No cervical, infraclavicular, axillary or inguinal adenopathy. Neuro: No focal neurological deficits. Psych: Alert and oriented X 3, appropriate mood and affect.   Lab Results: Lab Results  Component Value Date   WBC 10.1 05/21/2012   HGB 11.3* 05/21/2012   HCT 32.3* 05/21/2012   MCV 76.9* 05/21/2012   PLT 202 05/21/2012   NEUTROABS 5.2 05/21/2012     Assessment:  A 61 year old [place], [place], woman with a history of metastatic HER-2 positive breast carcinoma originally diagnosed September 2004, on maintenance q.3-week Herceptin.  2. History of superior vena caval syndrome on Coumadin. INR pending. 3. History of chemotherapy-induced neuropathy.  Continues on Neurontin 600 mg p.o. t.i.d. 4. History of hypertension, managed by Dr. Sudie Bailey.  5. Generalized anxiety disorder, disruptive outbursts by her admission.  6. Last echo 04/23/12, EF 60-65%. 7. PET scan performed today   Plan:  1. Herceptin today as scheduled. 2. Return in 3 weeks for follow up prior to next Herceptin dosing. 3. INR subtherapeutic, I have asked that she restart Coumadin at 5 mg per day.  4. Restaging PET scan 05/21/12.   This plan was reviewed with the patient, who voices understanding and agreement.  She knows to call with any changes oroblems.   Fatuma Dowers, md

## 2012-05-21 NOTE — Telephone Encounter (Signed)
3 weeks  Sent michelle to set up treatment

## 2012-05-21 NOTE — Telephone Encounter (Signed)
Per staff message and POF I have scheduled appts.  JMW  

## 2012-05-21 NOTE — Progress Notes (Unsigned)
Per MD, pt is to take coumadin 5mg  daily. Will recheck labs 06/13/12

## 2012-05-22 ENCOUNTER — Ambulatory Visit: Payer: Medicare Other | Admitting: Oncology

## 2012-05-22 ENCOUNTER — Ambulatory Visit: Payer: Medicare Other

## 2012-05-22 ENCOUNTER — Other Ambulatory Visit: Payer: Medicare Other | Admitting: Lab

## 2012-05-23 ENCOUNTER — Ambulatory Visit: Payer: Medicare Other

## 2012-05-23 ENCOUNTER — Other Ambulatory Visit: Payer: Medicare Other | Admitting: Lab

## 2012-05-28 ENCOUNTER — Other Ambulatory Visit: Payer: Self-pay | Admitting: *Deleted

## 2012-05-28 MED ORDER — ESZOPICLONE 3 MG PO TABS: 3.0000 mg | ORAL_TABLET | Freq: Every day | ORAL | Status: DC

## 2012-06-13 ENCOUNTER — Ambulatory Visit (HOSPITAL_BASED_OUTPATIENT_CLINIC_OR_DEPARTMENT_OTHER): Payer: Medicare Other

## 2012-06-13 ENCOUNTER — Other Ambulatory Visit (HOSPITAL_BASED_OUTPATIENT_CLINIC_OR_DEPARTMENT_OTHER): Payer: Medicare Other | Admitting: Lab

## 2012-06-13 ENCOUNTER — Ambulatory Visit (HOSPITAL_BASED_OUTPATIENT_CLINIC_OR_DEPARTMENT_OTHER): Payer: Medicare Other | Admitting: Oncology

## 2012-06-13 VITALS — BP 154/91 | HR 106 | Temp 98.2°F | Resp 20 | Ht 63.0 in | Wt 285.5 lb

## 2012-06-13 DIAGNOSIS — Z170 Estrogen receptor positive status [ER+]: Secondary | ICD-10-CM

## 2012-06-13 DIAGNOSIS — C50219 Malignant neoplasm of upper-inner quadrant of unspecified female breast: Secondary | ICD-10-CM

## 2012-06-13 DIAGNOSIS — C50919 Malignant neoplasm of unspecified site of unspecified female breast: Secondary | ICD-10-CM

## 2012-06-13 DIAGNOSIS — Z7901 Long term (current) use of anticoagulants: Secondary | ICD-10-CM

## 2012-06-13 DIAGNOSIS — D6481 Anemia due to antineoplastic chemotherapy: Secondary | ICD-10-CM

## 2012-06-13 DIAGNOSIS — F411 Generalized anxiety disorder: Secondary | ICD-10-CM

## 2012-06-13 DIAGNOSIS — Z5112 Encounter for antineoplastic immunotherapy: Secondary | ICD-10-CM

## 2012-06-13 LAB — COMPREHENSIVE METABOLIC PANEL (CC13)
ALT: 9 U/L (ref 0–55)
Albumin: 3.8 g/dL (ref 3.5–5.0)
CO2: 20 mEq/L — ABNORMAL LOW (ref 22–29)
Calcium: 9.9 mg/dL (ref 8.4–10.4)
Chloride: 108 mEq/L — ABNORMAL HIGH (ref 98–107)
Glucose: 117 mg/dl — ABNORMAL HIGH (ref 70–99)
Potassium: 3.5 mEq/L (ref 3.5–5.1)
Sodium: 141 mEq/L (ref 136–145)
Total Protein: 8.6 g/dL — ABNORMAL HIGH (ref 6.4–8.3)

## 2012-06-13 LAB — CBC WITH DIFFERENTIAL/PLATELET
Basophils Absolute: 0 10*3/uL (ref 0.0–0.1)
Eosinophils Absolute: 0.2 10*3/uL (ref 0.0–0.5)
HGB: 13.1 g/dL (ref 11.6–15.9)
MCV: 75.6 fL — ABNORMAL LOW (ref 79.5–101.0)
MONO#: 0.8 10*3/uL (ref 0.1–0.9)
NEUT#: 7.6 10*3/uL — ABNORMAL HIGH (ref 1.5–6.5)
RBC: 4.91 10*6/uL (ref 3.70–5.45)
RDW: 16.3 % — ABNORMAL HIGH (ref 11.2–14.5)
WBC: 12 10*3/uL — ABNORMAL HIGH (ref 3.9–10.3)
nRBC: 0 % (ref 0–0)

## 2012-06-13 LAB — LACTATE DEHYDROGENASE (CC13): LDH: 221 U/L — ABNORMAL HIGH (ref 125–220)

## 2012-06-13 LAB — PROTIME-INR: Protime: 30 Seconds — ABNORMAL HIGH (ref 10.6–13.4)

## 2012-06-13 MED ORDER — ACETAMINOPHEN 325 MG PO TABS
650.0000 mg | ORAL_TABLET | Freq: Once | ORAL | Status: AC
  Administered 2012-06-13: 650 mg via ORAL

## 2012-06-13 MED ORDER — SODIUM CHLORIDE 0.9 % IV SOLN
Freq: Once | INTRAVENOUS | Status: AC
  Administered 2012-06-13: 10:00:00 via INTRAVENOUS

## 2012-06-13 MED ORDER — HEPARIN SOD (PORK) LOCK FLUSH 100 UNIT/ML IV SOLN
500.0000 [IU] | Freq: Once | INTRAVENOUS | Status: AC | PRN
  Administered 2012-06-13: 500 [IU]
  Filled 2012-06-13: qty 5

## 2012-06-13 MED ORDER — TRASTUZUMAB CHEMO INJECTION 440 MG
6.0000 mg/kg | Freq: Once | INTRAVENOUS | Status: AC
  Administered 2012-06-13: 798 mg via INTRAVENOUS
  Filled 2012-06-13: qty 38

## 2012-06-13 MED ORDER — SODIUM CHLORIDE 0.9 % IJ SOLN
10.0000 mL | INTRAMUSCULAR | Status: DC | PRN
  Administered 2012-06-13: 10 mL
  Filled 2012-06-13: qty 10

## 2012-06-13 MED ORDER — DIPHENHYDRAMINE HCL 25 MG PO CAPS
50.0000 mg | ORAL_CAPSULE | Freq: Once | ORAL | Status: AC
  Administered 2012-06-13: 50 mg via ORAL

## 2012-06-13 NOTE — Progress Notes (Signed)
Hematology and Oncology Follow Up Visit  PARRISH DADDARIO [id number] Mar 17, 1951 61 y.o.  HPI: A 61 year old , [place], woman with a history of metastatic HER-2 positive breast carcinoma originally diagnosed September 2004, on maintenance q.3-week Herceptin, due for next dosing today. 2. 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  3. History of chemotherapy-induced neuropathy.  On Neurontin 600 mg p.o. T.i.d.  4. History of hypertension, managed by Dr. Sudie Bailey.   Interim History:   [name] is seen today with her husband in accompaniment for followup prior to next every 3 week dose of maintenance Herceptin. She denies any new complaints, she still has ongoing issues with pain, but utilizes MS Contin appropriately. She also has history of chemotherapy-induced neuropathy and utilizes Neurontin 600 mg by mouth 3 times a day. Also takes Xanax for what she calls "temper tantrums". She denies fevers, chills, or night sweats. No increased shortness of breath or chest pain. Has occasional "muscle spasms" in the chest and back. Appetite has been good, she says she eats only one meal a day.No nausea, emesis diarrhea or constipation. No self detected problems with the breast.   Currently off coumadin   Medications:   I have reviewed the patient's current medications.  Allergies:  Allergies  Allergen Reactions  . Adhesive (Tape) Other (See Comments)    Tears skin   . Penicillins Hives    Physical Exam: Filed Vitals:   06/13/12 0848  BP: 154/91  Pulse: 106  Temp: 98.2 F (36.8 C)  Resp: 20   General: Well developed, well nourished, in no acute distress.  EENT: No ocular or oral lesions. No stomatitis.  Respiratory: Lungs are clear to auscultation bilaterally with normal respiratory movement and no accessory muscle use. Cardiac: No murmur, rub or tachycardia. No upper or lower extremity edema.  GI: Abdomen is soft, no palpable hepatosplenomegaly. No fluid  wave. No tenderness. Musculoskeletal: No kyphosis, no tenderness over the spine, ribs or hips. Lymph: No cervical, infraclavicular, axillary or inguinal adenopathy. Neuro: No focal neurological deficits. Psych: Alert and oriented X 3, appropriate mood and affect.   Lab Results: Lab Results  Component Value Date   WBC 12.0* 06/13/2012   HGB 13.1 06/13/2012   HCT 37.1 06/13/2012   MCV 75.6* 06/13/2012   PLT 271 06/13/2012   NEUTROABS 7.6* 06/13/2012     Assessment:  A 61 year old [place], [place], woman with a history of metastatic HER-2 positive breast carcinoma originally diagnosed September 2004, on maintenance q.3-week Herceptin.  2. History of superior vena caval syndrome on Coumadin. INR pending. 3. History of chemotherapy-induced neuropathy.  Continues on Neurontin 600 mg p.o. t.i.d. 4. History of hypertension, managed by Dr. Sudie Bailey.  5. Generalized anxiety disorder, disruptive outbursts by her admission.  6. Last echo 04/23/12, EF 60-65%.    Plan:  1. Herceptin today as scheduled. 2. Return in 3 weeks for follow up prior to next Herceptin dosing. Review her PET scan. There is no specific evidence for recurrence. There is uptake around the sternum which has been present in the past and likely represents post treatment changes. There is nonspecific uptake in the iliac area which again is a partial significance. There is no evidence of systemic or visceral involvement. Current plan is to continue Herceptin every 3 weeks we will see her in 3 weeks' time. She is tolerating it well with no evidence of toxicity. 30 minutes spent with the patient half that time and patient-related counseling    This plan was  reviewed with the patient, who voices understanding and agreement.  She knows to call with any changes oroblems.   Charish Schroepfer, md

## 2012-06-13 NOTE — Patient Instructions (Signed)
[facility] Discharge Instructions for Patients Receiving Chemotherapy  Today you received the following chemotherapy agents Herceptin To help prevent nausea and vomiting after your treatment, we encourage you to take your nausea medication as prescribed.  If you develop nausea and vomiting that is not controlled by your nausea medication, call the clinic. If it is after clinic hours your family physician or the after hours number for the clinic or go to the Emergency Department.   BELOW ARE SYMPTOMS THAT SHOULD BE REPORTED IMMEDIATELY:  *FEVER GREATER THAN 100.5 F  *CHILLS WITH OR WITHOUT FEVER  NAUSEA AND VOMITING THAT IS NOT CONTROLLED WITH YOUR NAUSEA MEDICATION  *UNUSUAL SHORTNESS OF BREATH  *UNUSUAL BRUISING OR BLEEDING  TENDERNESS IN MOUTH AND THROAT WITH OR WITHOUT PRESENCE OF ULCERS  *URINARY PROBLEMS  *BOWEL PROBLEMS  UNUSUAL RASH Items with * indicate a potential emergency and should be followed up as soon as possible.  One of the nurses will contact you 24 hours after your treatment. Please let the nurse know about any problems that you may have experienced. Feel free to call the clinic you have any questions or concerns. The clinic phone number is [phone].   I have been informed and understand all the instructions given to me. I know to contact the clinic, my physician, or go to the Emergency Department if any problems should occur. I do not have any questions at this time, but understand that I may call the clinic during office hours   should I have any questions or need assistance in obtaining follow up care.    __________________________________________  _____________  __________ Signature of Patient or Authorized Representative            Date                   Time    __________________________________________ Nurse's Signature    

## 2012-06-26 ENCOUNTER — Other Ambulatory Visit: Payer: Self-pay | Admitting: Oncology

## 2012-06-26 DIAGNOSIS — C50919 Malignant neoplasm of unspecified site of unspecified female breast: Secondary | ICD-10-CM

## 2012-06-30 ENCOUNTER — Other Ambulatory Visit: Payer: Self-pay | Admitting: *Deleted

## 2012-06-30 DIAGNOSIS — C50919 Malignant neoplasm of unspecified site of unspecified female breast: Secondary | ICD-10-CM

## 2012-06-30 MED ORDER — ALPRAZOLAM 1 MG PO TABS: 1.0000 mg | ORAL_TABLET | Freq: Three times a day (TID) | ORAL | Status: DC | PRN

## 2012-07-02 ENCOUNTER — Other Ambulatory Visit: Payer: Self-pay | Admitting: *Deleted

## 2012-07-02 DIAGNOSIS — C50919 Malignant neoplasm of unspecified site of unspecified female breast: Secondary | ICD-10-CM

## 2012-07-02 DIAGNOSIS — I871 Compression of vein: Secondary | ICD-10-CM

## 2012-07-04 ENCOUNTER — Ambulatory Visit (HOSPITAL_BASED_OUTPATIENT_CLINIC_OR_DEPARTMENT_OTHER): Payer: Medicare Other

## 2012-07-04 ENCOUNTER — Other Ambulatory Visit (HOSPITAL_BASED_OUTPATIENT_CLINIC_OR_DEPARTMENT_OTHER): Payer: Medicare Other | Admitting: Lab

## 2012-07-04 ENCOUNTER — Ambulatory Visit (HOSPITAL_BASED_OUTPATIENT_CLINIC_OR_DEPARTMENT_OTHER): Payer: Medicare Other | Admitting: Oncology

## 2012-07-04 VITALS — BP 152/87 | HR 87 | Temp 98.1°F | Resp 20 | Ht 63.0 in | Wt 289.6 lb

## 2012-07-04 DIAGNOSIS — G620 Drug-induced polyneuropathy: Secondary | ICD-10-CM

## 2012-07-04 DIAGNOSIS — Z5112 Encounter for antineoplastic immunotherapy: Secondary | ICD-10-CM

## 2012-07-04 DIAGNOSIS — Z79899 Other long term (current) drug therapy: Secondary | ICD-10-CM

## 2012-07-04 DIAGNOSIS — C50919 Malignant neoplasm of unspecified site of unspecified female breast: Secondary | ICD-10-CM

## 2012-07-04 DIAGNOSIS — C50219 Malignant neoplasm of upper-inner quadrant of unspecified female breast: Secondary | ICD-10-CM

## 2012-07-04 DIAGNOSIS — I871 Compression of vein: Secondary | ICD-10-CM

## 2012-07-04 DIAGNOSIS — Z7901 Long term (current) use of anticoagulants: Secondary | ICD-10-CM

## 2012-07-04 LAB — CBC WITH DIFFERENTIAL/PLATELET
BASO%: 0.3 % (ref 0.0–2.0)
HCT: 33.5 % — ABNORMAL LOW (ref 34.8–46.6)
LYMPH%: 32.6 % (ref 14.0–49.7)
MCH: 26.5 pg (ref 25.1–34.0)
MCHC: 34 g/dL (ref 31.5–36.0)
MCV: 77.9 fL — ABNORMAL LOW (ref 79.5–101.0)
MONO#: 0.8 10*3/uL (ref 0.1–0.9)
MONO%: 7.9 % (ref 0.0–14.0)
NEUT%: 56.6 % (ref 38.4–76.8)
Platelets: 243 10*3/uL (ref 145–400)
WBC: 10.2 10*3/uL (ref 3.9–10.3)

## 2012-07-04 LAB — COMPREHENSIVE METABOLIC PANEL (CC13)
ALT: 15 U/L (ref 0–55)
AST: 16 U/L (ref 5–34)
Albumin: 3.5 g/dL (ref 3.5–5.0)
BUN: 9 mg/dL (ref 7.0–26.0)
CO2: 27 mEq/L (ref 22–29)
Calcium: 9.3 mg/dL (ref 8.4–10.4)
Chloride: 105 mEq/L (ref 98–107)
Potassium: 3.1 mEq/L — ABNORMAL LOW (ref 3.5–5.1)

## 2012-07-04 LAB — CANCER ANTIGEN 27.29: CA 27.29: 24 U/mL (ref 0–39)

## 2012-07-04 LAB — PROTIME-INR

## 2012-07-04 MED ORDER — TRASTUZUMAB CHEMO INJECTION 440 MG
6.0000 mg/kg | Freq: Once | INTRAVENOUS | Status: AC
  Administered 2012-07-04: 798 mg via INTRAVENOUS
  Filled 2012-07-04: qty 38

## 2012-07-04 MED ORDER — ACETAMINOPHEN 325 MG PO TABS
650.0000 mg | ORAL_TABLET | Freq: Once | ORAL | Status: AC
  Administered 2012-07-04: 650 mg via ORAL

## 2012-07-04 MED ORDER — DIPHENHYDRAMINE HCL 25 MG PO CAPS
50.0000 mg | ORAL_CAPSULE | Freq: Once | ORAL | Status: AC
  Administered 2012-07-04: 50 mg via ORAL

## 2012-07-04 MED ORDER — SODIUM CHLORIDE 0.9 % IJ SOLN
10.0000 mL | INTRAMUSCULAR | Status: DC | PRN
  Administered 2012-07-04: 10 mL
  Filled 2012-07-04: qty 10

## 2012-07-04 MED ORDER — HEPARIN SOD (PORK) LOCK FLUSH 100 UNIT/ML IV SOLN
250.0000 [IU] | Freq: Once | INTRAVENOUS | Status: AC | PRN
  Administered 2012-07-04: 250 [IU]
  Filled 2012-07-04: qty 5

## 2012-07-04 MED ORDER — SODIUM CHLORIDE 0.9 % IV SOLN
Freq: Once | INTRAVENOUS | Status: AC
  Administered 2012-07-04: 10:00:00 via INTRAVENOUS

## 2012-07-04 NOTE — Patient Instructions (Signed)
[facility] Discharge Instructions for Patients Receiving Chemotherapy  Today you received the following chemotherapy agents Herceptin  To help prevent nausea and vomiting after your treatment, we encourage you to take your nausea medication as prescribed.   If you develop nausea and vomiting that is not controlled by your nausea medication, call the clinic. If it is after clinic hours your family physician or the after hours number for the clinic or go to the Emergency Department.   BELOW ARE SYMPTOMS THAT SHOULD BE REPORTED IMMEDIATELY:  *FEVER GREATER THAN 100.5 F  *CHILLS WITH OR WITHOUT FEVER  NAUSEA AND VOMITING THAT IS NOT CONTROLLED WITH YOUR NAUSEA MEDICATION  *UNUSUAL SHORTNESS OF BREATH  *UNUSUAL BRUISING OR BLEEDING  TENDERNESS IN MOUTH AND THROAT WITH OR WITHOUT PRESENCE OF ULCERS  *URINARY PROBLEMS  *BOWEL PROBLEMS  UNUSUAL RASH Items with * indicate a potential emergency and should be followed up as soon as possible.  One of the nurses will contact you 24 hours after your treatment. Please let the nurse know about any problems that you may have experienced. Feel free to call the clinic you have any questions or concerns. The clinic phone number is [phone].     

## 2012-07-04 NOTE — Progress Notes (Signed)
Hematology and Oncology Follow Up Visit  [name] [id number] Sep 23, 1950 61 y.o.  HPI: A 61 year old [place], [place], woman with a history of metastatic HER-2 positive breast carcinoma originally diagnosed September 2004, on maintenance q.3-week Herceptin, due for next dosing today. 2. 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  3. History of chemotherapy-induced neuropathy.  On Neurontin 600 mg p.o. T.i.d.  4. History of hypertension, managed by Dr. Sudie Bailey.   Interim History:   [name] is seen today with her husband in accompaniment for followup prior to next every 3 week dose of maintenance Herceptin. She denies any new complaints, she still has ongoing issues with pain, but utilizes MS Contin appropriately. She also has history of chemotherapy-induced neuropathy and utilizes Neurontin 600 mg by mouth 3 times a day. Also takes Xanax for what she calls "temper tantrums". She denies fevers, chills, or night sweats. No increased shortness of breath or chest pain. Has occasional "muscle spasms" in the chest and back. Appetite has been good, she says she eats only one meal a day.No nausea, emesis diarrhea or constipation. No self detected problems with the breast.   Currently off coumadin   Medications:   I have reviewed the patient's current medications.  Allergies:  Allergies  Allergen Reactions  . Adhesive (Tape) Other (See Comments)    Tears skin   . Penicillins Hives    Physical Exam: Filed Vitals:   07/04/12 0847  BP: 152/87  Pulse: 87  Temp: 98.1 F (36.7 C)  Resp: 20   General: Well developed, well nourished, in no acute distress.  EENT: No ocular or oral lesions. No stomatitis.  Respiratory: Lungs are clear to auscultation bilaterally with normal respiratory movement and no accessory muscle use. Cardiac: No murmur, rub or tachycardia. No upper or lower extremity edema.  GI: Abdomen is soft, no palpable hepatosplenomegaly. No fluid wave.  No tenderness. Musculoskeletal: No kyphosis, no tenderness over the spine, ribs or hips. Lymph: No cervical, infraclavicular, axillary or inguinal adenopathy. Neuro: No focal neurological deficits. Psych: Alert and oriented X 3, appropriate mood and affect.   Lab Results: Lab Results  Component Value Date   WBC 10.2 07/04/2012   HGB 11.4* 07/04/2012   HCT 33.5* 07/04/2012   MCV 77.9* 07/04/2012   PLT 243 07/04/2012   NEUTROABS 5.8 07/04/2012     Assessment:  A 61 year old [place], [place], woman with a history of metastatic HER-2 positive breast carcinoma originally diagnosed September 2004, on maintenance q.3-week Herceptin.  2. History of superior vena caval syndrome on Coumadin. INR pending. 3. History of chemotherapy-induced neuropathy.  Continues on Neurontin 600 mg p.o. t.i.d. 4. History of hypertension, managed by Dr. Sudie Bailey.  5. Generalized anxiety disorder, disruptive outbursts by her admission.  6. Last echo 04/23/12, EF 60-65%.    Plan:  1. Herceptin today as scheduled. 2. Return in 3 weeks for follow up prior to next Herceptin dosing. Review her PET scan. There is no specific evidence for recurrence. There is uptake around the sternum which has been present in the past and likely represents post treatment changes. There is nonspecific uptake in the iliac area which again is a partial significance. There is no evidence of systemic or visceral involvement. Current plan is to continue Herceptin every 3 weeks we will see her in 3 weeks' time. She is tolerating it well with no evidence of toxicity. 30 minutes spent with the patient half that time and patient-related counseling    This plan was  reviewed with the patient, who voices understanding and agreement.  She knows to call with any changes oroblems.   Janeice Stegall, md

## 2012-07-22 ENCOUNTER — Other Ambulatory Visit: Payer: Self-pay | Admitting: *Deleted

## 2012-07-22 DIAGNOSIS — C50919 Malignant neoplasm of unspecified site of unspecified female breast: Secondary | ICD-10-CM

## 2012-07-25 ENCOUNTER — Other Ambulatory Visit (HOSPITAL_BASED_OUTPATIENT_CLINIC_OR_DEPARTMENT_OTHER): Payer: Medicare Other | Admitting: Lab

## 2012-07-25 ENCOUNTER — Telehealth: Payer: Self-pay | Admitting: *Deleted

## 2012-07-25 ENCOUNTER — Other Ambulatory Visit: Payer: Self-pay | Admitting: *Deleted

## 2012-07-25 ENCOUNTER — Ambulatory Visit (HOSPITAL_BASED_OUTPATIENT_CLINIC_OR_DEPARTMENT_OTHER): Payer: Medicare Other

## 2012-07-25 ENCOUNTER — Ambulatory Visit (HOSPITAL_BASED_OUTPATIENT_CLINIC_OR_DEPARTMENT_OTHER): Payer: Medicare Other | Admitting: Oncology

## 2012-07-25 VITALS — BP 155/98 | HR 93 | Temp 98.6°F | Resp 20 | Ht 63.0 in | Wt 293.3 lb

## 2012-07-25 DIAGNOSIS — C50219 Malignant neoplasm of upper-inner quadrant of unspecified female breast: Secondary | ICD-10-CM

## 2012-07-25 DIAGNOSIS — Z7901 Long term (current) use of anticoagulants: Secondary | ICD-10-CM

## 2012-07-25 DIAGNOSIS — Z5112 Encounter for antineoplastic immunotherapy: Secondary | ICD-10-CM

## 2012-07-25 DIAGNOSIS — C50919 Malignant neoplasm of unspecified site of unspecified female breast: Secondary | ICD-10-CM

## 2012-07-25 DIAGNOSIS — I871 Compression of vein: Secondary | ICD-10-CM

## 2012-07-25 DIAGNOSIS — G620 Drug-induced polyneuropathy: Secondary | ICD-10-CM

## 2012-07-25 LAB — PROTIME-INR: Protime: 20.4 Seconds — ABNORMAL HIGH (ref 10.6–13.4)

## 2012-07-25 LAB — CBC WITH DIFFERENTIAL/PLATELET
BASO%: 0.3 % (ref 0.0–2.0)
EOS%: 3.6 % (ref 0.0–7.0)
MCH: 27 pg (ref 25.1–34.0)
MCV: 77.5 fL — ABNORMAL LOW (ref 79.5–101.0)
MONO%: 8 % (ref 0.0–14.0)
RBC: 4.23 10*6/uL (ref 3.70–5.45)
RDW: 16.4 % — ABNORMAL HIGH (ref 11.2–14.5)
nRBC: 0 % (ref 0–0)

## 2012-07-25 LAB — COMPREHENSIVE METABOLIC PANEL (CC13)
Albumin: 3.5 g/dL (ref 3.5–5.0)
Alkaline Phosphatase: 99 U/L (ref 40–150)
BUN: 10 mg/dL (ref 7.0–26.0)
Glucose: 112 mg/dl — ABNORMAL HIGH (ref 70–99)
Total Bilirubin: 0.75 mg/dL (ref 0.20–1.20)

## 2012-07-25 MED ORDER — DIPHENHYDRAMINE HCL 25 MG PO CAPS
50.0000 mg | ORAL_CAPSULE | Freq: Once | ORAL | Status: AC
  Administered 2012-07-25: 50 mg via ORAL

## 2012-07-25 MED ORDER — MORPHINE SULFATE 15 MG PO TABS: 15.0000 mg | ORAL_TABLET | ORAL | Status: DC | PRN

## 2012-07-25 MED ORDER — MORPHINE SULFATE ER 60 MG PO TBCR: 60.0000 mg | EXTENDED_RELEASE_TABLET | Freq: Two times a day (BID) | ORAL | Status: DC

## 2012-07-25 MED ORDER — TRASTUZUMAB CHEMO INJECTION 440 MG
6.0000 mg/kg | Freq: Once | INTRAVENOUS | Status: AC
  Administered 2012-07-25: 798 mg via INTRAVENOUS
  Filled 2012-07-25: qty 38

## 2012-07-25 MED ORDER — SODIUM CHLORIDE 0.9 % IV SOLN: Freq: Once | INTRAVENOUS | Status: DC

## 2012-07-25 MED ORDER — ACETAMINOPHEN 325 MG PO TABS
650.0000 mg | ORAL_TABLET | Freq: Once | ORAL | Status: AC
  Administered 2012-07-25: 650 mg via ORAL

## 2012-07-25 MED ORDER — HEPARIN SOD (PORK) LOCK FLUSH 100 UNIT/ML IV SOLN
250.0000 [IU] | Freq: Once | INTRAVENOUS | Status: AC | PRN
  Administered 2012-07-25: 11:00:00
  Filled 2012-07-25: qty 5

## 2012-07-25 MED ORDER — SODIUM CHLORIDE 0.9 % IJ SOLN
10.0000 mL | INTRAMUSCULAR | Status: DC | PRN
  Administered 2012-07-25: 10 mL
  Filled 2012-07-25: qty 10

## 2012-07-25 NOTE — Patient Instructions (Addendum)
[facility] Discharge Instructions for Patients Receiving Chemotherapy  Today you received the following chemotherapy agent Herceptin.  To help prevent nausea and vomiting after your treatment, we encourage you to take your nausea medication. Begin taking your nausea medication as often as prescribed for by Dr. Rubin.    If you develop nausea and vomiting that is not controlled by your nausea medication, call the clinic. If it is after clinic hours your family physician or the after hours number for the clinic or go to the Emergency Department.   BELOW ARE SYMPTOMS THAT SHOULD BE REPORTED IMMEDIATELY:  *FEVER GREATER THAN 100.5 F  *CHILLS WITH OR WITHOUT FEVER  NAUSEA AND VOMITING THAT IS NOT CONTROLLED WITH YOUR NAUSEA MEDICATION  *UNUSUAL SHORTNESS OF BREATH  *UNUSUAL BRUISING OR BLEEDING  TENDERNESS IN MOUTH AND THROAT WITH OR WITHOUT PRESENCE OF ULCERS  *URINARY PROBLEMS  *BOWEL PROBLEMS  UNUSUAL RASH Items with * indicate a potential emergency and should be followed up as soon as possible.  One of the nurses will contact you 24 hours after your treatment. Please let the nurse know about any problems that you may have experienced. Feel free to call the clinic you have any questions or concerns. The clinic phone number is [phone].   I have been informed and understand all the instructions given to me. I know to contact the clinic, my physician, or go to the Emergency Department if any problems should occur. I do not have any questions at this time, but understand that I may call the clinic during office hours   should I have any questions or need assistance in obtaining follow up care.    __________________________________________  _____________  __________ Signature of Patient or Authorized Representative            Date                   Time    __________________________________________ Nurse's Signature    

## 2012-07-25 NOTE — Telephone Encounter (Signed)
Per staff message and POF I have scheduled appts. I gave patient schedule.  JMW

## 2012-07-25 NOTE — Telephone Encounter (Signed)
Gave patient appointment for lab and md and treatment  Sent [name] email to set up patient's treatment

## 2012-07-25 NOTE — Progress Notes (Signed)
Hematology and Oncology Follow Up Visit  [name] [id number] Jun 04, 1951 61 y.o.  HPI: A 61 year old [place], [place], woman with a history of metastatic HER-2 positive breast carcinoma originally diagnosed September 2004, on maintenance q.3-week Herceptin, due for next dosing today. 2. 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  3. History of chemotherapy-induced neuropathy.  On Neurontin 600 mg p.o. T.i.d.  4. History of hypertension, managed by Dr. Sudie Bailey. , currently some changes made in her BP meds.   Interim History:   [name] is seen today with her husband in accompaniment for followup prior to next every 3 week dose of maintenance Herceptin. She denies any new complaints, she still has ongoing issues with pain, but utilizes MS Contin appropriately. She also has history of chemotherapy-induced neuropathy and utilizes Neurontin 600 mg by mouth 3 times a day. Also takes Xanax for what she calls "temper tantrums". She denies fevers, chills, or night sweats. No increased shortness of breath or chest pain. Has occasional "muscle spasms" in the chest and back. Appetite has been good, she says she eats only one meal a day.No nausea, emesis diarrhea or constipation. No self detected problems with the breast. She note stightness in her chest from likely from scar tissue.  Currently off coumadin   Medications:   I have reviewed the patient's current medications.  Allergies:  Allergies  Allergen Reactions  . Adhesive (Tape) Other (See Comments)    Tears skin   . Penicillins Hives    Physical Exam: Filed Vitals:   07/25/12 0858  BP: 155/98  Pulse: 93  Temp: 98.6 F (37 C)  Resp: 20   General: Well developed, well nourished, in no acute distress.  EENT: No ocular or oral lesions. No stomatitis.  Respiratory: Lungs are clear to auscultation bilaterally with normal respiratory movement and no accessory muscle use. Cardiac: No murmur, rub or  tachycardia. No upper or lower extremity edema.  GI: Abdomen is soft, no palpable hepatosplenomegaly. No fluid wave. No tenderness. Musculoskeletal: No kyphosis, no tenderness over the spine, ribs or hips. Lymph: No cervical, infraclavicular, axillary or inguinal adenopathy. Neuro: No focal neurological deficits. Psych: Alert and oriented X 3, appropriate mood and affect.   Lab Results: Lab Results  Component Value Date   WBC 9.2 07/25/2012   HGB 11.4* 07/25/2012   HCT 32.8* 07/25/2012   MCV 77.5* 07/25/2012   PLT 206 07/25/2012   NEUTROABS 5.0 07/25/2012     Assessment:  A 61 year old [place], [place], woman with a history of metastatic HER-2 positive breast carcinoma originally diagnosed September 2004, on maintenance q.3-week Herceptin.  2. History of superior vena caval syndrome on Coumadin. INR pending. 3. History of chemotherapy-induced neuropathy.  Continues on Neurontin 600 mg p.o. t.i.d. 4. History of hypertension, managed by Dr. Sudie Bailey.  5. Generalized anxiety disorder, disruptive outbursts by her admission.  6. Last echo 04/23/12, EF 60-65%.    Plan:  1. Herceptin today as scheduled. 2. Return in 3 weeks for follow up prior to next Herceptin dosing. Review her PET scan. There is no specific evidence for recurrence. There is uptake around the sternum which has been present in the past and likely represents post treatment changes. There is nonspecific uptake in the iliac area which again is a partial significance. There is no evidence of systemic or visceral involvement. Current plan is to continue Herceptin every 3 weeks we will see her in 3 weeks' time. She is tolerating it well with no evidence of  toxicity. 30 minutes spent with the patient half that time and patient-related counseling    This plan was reviewed with the patient, who voices understanding and agreement.  She knows to call with any changes oroblems.   Bartow Zylstra, md

## 2012-07-30 ENCOUNTER — Ambulatory Visit (HOSPITAL_COMMUNITY)
Admission: RE | Admit: 2012-07-30 | Discharge: 2012-07-30 | Disposition: A | Discharge status: Home / Self Care | Payer: Medicare Other | Source: Ambulatory Visit | Attending: Oncology | Admitting: Oncology

## 2012-07-30 DIAGNOSIS — C50919 Malignant neoplasm of unspecified site of unspecified female breast: Secondary | ICD-10-CM | POA: Insufficient documentation

## 2012-07-30 DIAGNOSIS — Z09 Encounter for follow-up examination after completed treatment for conditions other than malignant neoplasm: Secondary | ICD-10-CM

## 2012-07-30 DIAGNOSIS — I517 Cardiomegaly: Secondary | ICD-10-CM | POA: Insufficient documentation

## 2012-07-30 NOTE — Progress Notes (Signed)
Echocardiogram 2D Echocardiogram limited has been performed.  Cravey, Kyndall Amero 07/30/2012, 12:29 PM

## 2012-08-14 ENCOUNTER — Other Ambulatory Visit: Payer: Self-pay | Admitting: *Deleted

## 2012-08-14 DIAGNOSIS — C50919 Malignant neoplasm of unspecified site of unspecified female breast: Secondary | ICD-10-CM

## 2012-08-15 ENCOUNTER — Telehealth: Payer: Self-pay | Admitting: *Deleted

## 2012-08-15 ENCOUNTER — Ambulatory Visit (HOSPITAL_BASED_OUTPATIENT_CLINIC_OR_DEPARTMENT_OTHER): Payer: Medicare Other | Admitting: Oncology

## 2012-08-15 ENCOUNTER — Ambulatory Visit (HOSPITAL_COMMUNITY): Payer: Medicare Other

## 2012-08-15 ENCOUNTER — Ambulatory Visit (HOSPITAL_COMMUNITY)
Admission: RE | Admit: 2012-08-15 | Discharge: 2012-08-15 | Disposition: A | Discharge status: Home / Self Care | Payer: Medicare Other | Source: Ambulatory Visit | Attending: Oncology | Admitting: Oncology

## 2012-08-15 ENCOUNTER — Ambulatory Visit (HOSPITAL_BASED_OUTPATIENT_CLINIC_OR_DEPARTMENT_OTHER): Payer: Medicare Other

## 2012-08-15 ENCOUNTER — Other Ambulatory Visit (HOSPITAL_BASED_OUTPATIENT_CLINIC_OR_DEPARTMENT_OTHER): Payer: Medicare Other | Admitting: Lab

## 2012-08-15 VITALS — BP 160/95 | HR 98 | Temp 98.4°F | Resp 20 | Ht 63.0 in | Wt 288.5 lb

## 2012-08-15 DIAGNOSIS — M7989 Other specified soft tissue disorders: Secondary | ICD-10-CM

## 2012-08-15 DIAGNOSIS — C50219 Malignant neoplasm of upper-inner quadrant of unspecified female breast: Secondary | ICD-10-CM

## 2012-08-15 DIAGNOSIS — Z7901 Long term (current) use of anticoagulants: Secondary | ICD-10-CM

## 2012-08-15 DIAGNOSIS — Z5111 Encounter for antineoplastic chemotherapy: Secondary | ICD-10-CM

## 2012-08-15 DIAGNOSIS — C50919 Malignant neoplasm of unspecified site of unspecified female breast: Secondary | ICD-10-CM

## 2012-08-15 DIAGNOSIS — M79609 Pain in unspecified limb: Secondary | ICD-10-CM | POA: Insufficient documentation

## 2012-08-15 DIAGNOSIS — Z86718 Personal history of other venous thrombosis and embolism: Secondary | ICD-10-CM

## 2012-08-15 DIAGNOSIS — F411 Generalized anxiety disorder: Secondary | ICD-10-CM

## 2012-08-15 DIAGNOSIS — G620 Drug-induced polyneuropathy: Secondary | ICD-10-CM

## 2012-08-15 LAB — CBC WITH DIFFERENTIAL/PLATELET
Basophils Absolute: 0 10*3/uL (ref 0.0–0.1)
EOS%: 1.8 % (ref 0.0–7.0)
Eosinophils Absolute: 0.2 10*3/uL (ref 0.0–0.5)
HGB: 12.5 g/dL (ref 11.6–15.9)
MONO#: 0.5 10*3/uL (ref 0.1–0.9)
NEUT#: 5.5 10*3/uL (ref 1.5–6.5)
RDW: 16.1 % — ABNORMAL HIGH (ref 11.2–14.5)
WBC: 9.4 10*3/uL (ref 3.9–10.3)
lymph#: 3.2 10*3/uL (ref 0.9–3.3)

## 2012-08-15 LAB — PROTIME-INR
INR: 2.3 (ref 2.00–3.50)
Protime: 27.6 Seconds — ABNORMAL HIGH (ref 10.6–13.4)

## 2012-08-15 LAB — COMPREHENSIVE METABOLIC PANEL (CC13)
Alkaline Phosphatase: 88 U/L (ref 40–150)
Glucose: 99 mg/dl (ref 70–99)
Sodium: 142 mEq/L (ref 136–145)
Total Bilirubin: 0.64 mg/dL (ref 0.20–1.20)
Total Protein: 7.8 g/dL (ref 6.4–8.3)

## 2012-08-15 MED ORDER — HEPARIN SOD (PORK) LOCK FLUSH 100 UNIT/ML IV SOLN
250.0000 [IU] | Freq: Once | INTRAVENOUS | Status: AC | PRN
  Administered 2012-08-15: 250 [IU]
  Filled 2012-08-15: qty 5

## 2012-08-15 MED ORDER — TRASTUZUMAB CHEMO INJECTION 440 MG
6.0000 mg/kg | Freq: Once | INTRAVENOUS | Status: AC
  Administered 2012-08-15: 798 mg via INTRAVENOUS
  Filled 2012-08-15: qty 38

## 2012-08-15 MED ORDER — ACETAMINOPHEN 325 MG PO TABS
650.0000 mg | ORAL_TABLET | Freq: Once | ORAL | Status: AC
  Administered 2012-08-15: 650 mg via ORAL

## 2012-08-15 MED ORDER — SODIUM CHLORIDE 0.9 % IJ SOLN
10.0000 mL | INTRAMUSCULAR | Status: DC | PRN
  Administered 2012-08-15: 10 mL
  Filled 2012-08-15: qty 10

## 2012-08-15 MED ORDER — SODIUM CHLORIDE 0.9 % IV SOLN
Freq: Once | INTRAVENOUS | Status: AC
  Administered 2012-08-15: 10:00:00 via INTRAVENOUS

## 2012-08-15 MED ORDER — DIPHENHYDRAMINE HCL 25 MG PO CAPS
50.0000 mg | ORAL_CAPSULE | Freq: Once | ORAL | Status: AC
Start: 2012-08-15 — End: 2012-08-15
  Administered 2012-08-15: 50 mg via ORAL

## 2012-08-15 NOTE — Patient Instructions (Signed)
[facility] Discharge Instructions for Patients Receiving Chemotherapy  Today you received the following chemotherapy agents: Herceptin. To help prevent nausea and vomiting after your treatment, we encourage you to take your nausea medication as needed.  If you develop nausea and vomiting that is not controlled by your nausea medication, call the clinic. If it is after clinic hours your family physician or the after hours number for the clinic or go to the Emergency Department.   BELOW ARE SYMPTOMS THAT SHOULD BE REPORTED IMMEDIATELY:  *FEVER GREATER THAN 100.5 F  *CHILLS WITH OR WITHOUT FEVER  NAUSEA AND VOMITING THAT IS NOT CONTROLLED WITH YOUR NAUSEA MEDICATION  *UNUSUAL SHORTNESS OF BREATH  *UNUSUAL BRUISING OR BLEEDING  TENDERNESS IN MOUTH AND THROAT WITH OR WITHOUT PRESENCE OF ULCERS  *URINARY PROBLEMS  *BOWEL PROBLEMS  UNUSUAL RASH Items with * indicate a potential emergency and should be followed up as soon as possible.  Feel free to call the clinic you have any questions or concerns. The clinic phone number is [phone].   I have been informed and understand all the instructions given to me. I know to contact the clinic, my physician, or go to the Emergency Department if any problems should occur. I do not have any questions at this time, but understand that I may call the clinic during office hours   should I have any questions or need assistance in obtaining follow up care.    

## 2012-08-15 NOTE — Telephone Encounter (Signed)
Gave patient appointment with the new md   Made patient appointment for [place]

## 2012-08-15 NOTE — Progress Notes (Signed)
VASCULAR LAB PRELIMINARY  PRELIMINARY  PRELIMINARY  PRELIMINARY  Right upper extremity venous duplex  completed.    Preliminary report:  No evidence of right upper extremity DVT or superficial thrombosis.  [name], RVS 08/15/2012, 1:45 PM

## 2012-08-15 NOTE — Progress Notes (Signed)
  Hematology and Oncology Follow Up Visit  [name] [id number] [birth year] 61 y.o.  HPI: A 61 year old [place], [place], woman with a history of metastatic HER-2 positive breast carcinoma originally diagnosed September 2004, on maintenance q.3-week Herceptin, due for next dosing today. 2. 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  3. History of chemotherapy-induced neuropathy.  On Neurontin 600 mg p.o. T.i.d.  4. History of hypertension, managed by Dr. Sudie Bailey. , currently some changes made in her BP meds.   Interim History:   [name] is seen today with her husband in accompaniment for followup prior to next every 3 week dose of maintenance Herceptin. She denies any new complaints, she still has ongoing issues with pain, but utilizes MS Contin appropriately. She also has history of chemotherapy-induced neuropathy and utilizes Neurontin 600 mg by mouth 3 times a day. Also takes Xanax for what she calls "temper tantrums". She denies fevers, chills, or night sweats. No increased shortness of breath or chest pain.she notes a knot in her neck, rt side, associated with some h/as. Currently off coumadin   Medications:   I have reviewed the patient's current medications.  Allergies:  Allergies  Allergen Reactions  . Adhesive (Tape) Other (See Comments)    Tears skin   . Penicillins Hives    Physical Exam: Filed Vitals:   08/15/12 0836  BP: 160/95  Pulse: 98  Temp: 98.4 F (36.9 C)  Resp: 20   General: Well developed, well nourished, in no acute distress.  EENT: No ocular or oral lesions. No stomatitis.  Respiratory: Lungs are clear to auscultation bilaterally with normal respiratory movement and no accessory muscle use. Hickman catheter nl exit site , no neck swelling. Cardiac: No murmur, rub or tachycardia. No upper or lower extremity edema.  GI: Abdomen is soft, no palpable hepatosplenomegaly. No fluid wave. No tenderness. Musculoskeletal: No  kyphosis, no tenderness over the spine, ribs or hips. Lymph: No cervical, infraclavicular, axillary or inguinal adenopathy. Neuro: No focal neurological deficits. Psych: Alert and oriented X 3, appropriate mood and affect.   Lab Results: Lab Results  Component Value Date   WBC 9.4 08/15/2012   HGB 12.5 08/15/2012   HCT 36.0 08/15/2012   MCV 77.3* 08/15/2012   PLT 254 08/15/2012   NEUTROABS 5.5 08/15/2012     Assessment:  A 61 year old [place], [place], woman with a history of metastatic HER-2 positive breast carcinoma originally diagnosed September 2004, on maintenance q.3-week Herceptin.  2. History of superior vena caval syndrome on Coumadin. INR therapeutic 3. History of chemotherapy-induced neuropathy.  Continues on Neurontin 600 mg p.o. t.i.d. 4. History of hypertension, managed by Dr. Sudie Bailey.  5. Generalized anxiety disorder, disruptive outbursts by her admission.  6. Last echo 07/29/12 , EF 50-55%. 7. Sl neck tendeerness   Plan:  1. Herceptin today as scheduled. 2. Return in 3 weeks for follow up prior to next Herceptin dosing. 3. Doppler u/s rt neck today   This plan was reviewed with the patient, who voices understanding and agreement.  She knows to call with any changes oroblems.   Annalise Mcdiarmid, md

## 2012-08-15 NOTE — Addendum Note (Signed)
Addended by: Donnelly Angelica on: 08/15/2012 11:19 AM   Modules accepted: Orders

## 2012-08-16 ENCOUNTER — Telehealth: Payer: Self-pay | Admitting: *Deleted

## 2012-08-16 NOTE — Telephone Encounter (Signed)
Called pt to inform her that Dr. Donnie Coffin will no longer be with the practice as of 08/27/12 and I answered all questions at this time.  Confirmed 09/05/12 appt w/ pt.

## 2012-08-19 ENCOUNTER — Other Ambulatory Visit: Payer: Self-pay | Admitting: *Deleted

## 2012-08-19 DIAGNOSIS — C50919 Malignant neoplasm of unspecified site of unspecified female breast: Secondary | ICD-10-CM

## 2012-08-21 ENCOUNTER — Other Ambulatory Visit: Payer: Self-pay | Admitting: Oncology

## 2012-08-22 ENCOUNTER — Other Ambulatory Visit: Payer: Self-pay | Admitting: *Deleted

## 2012-08-22 DIAGNOSIS — C50919 Malignant neoplasm of unspecified site of unspecified female breast: Secondary | ICD-10-CM

## 2012-08-22 MED ORDER — ZOLPIDEM TARTRATE 5 MG PO TABS: 5.0000 mg | ORAL_TABLET | Freq: Every evening | ORAL | Status: DC | PRN

## 2012-08-22 MED ORDER — ALPRAZOLAM 1 MG PO TABS: 1.0000 mg | ORAL_TABLET | Freq: Three times a day (TID) | ORAL | Status: DC | PRN

## 2012-08-30 ENCOUNTER — Encounter: Payer: Self-pay | Admitting: Oncology

## 2012-09-05 ENCOUNTER — Telehealth: Payer: Self-pay | Admitting: *Deleted

## 2012-09-05 ENCOUNTER — Ambulatory Visit: Payer: Medicare Other | Admitting: Oncology

## 2012-09-05 ENCOUNTER — Ambulatory Visit (HOSPITAL_BASED_OUTPATIENT_CLINIC_OR_DEPARTMENT_OTHER): Payer: Medicare Other

## 2012-09-05 ENCOUNTER — Other Ambulatory Visit: Payer: Self-pay | Admitting: Oncology

## 2012-09-05 ENCOUNTER — Other Ambulatory Visit (HOSPITAL_BASED_OUTPATIENT_CLINIC_OR_DEPARTMENT_OTHER): Payer: Medicare Other | Admitting: Lab

## 2012-09-05 ENCOUNTER — Other Ambulatory Visit: Payer: Self-pay | Admitting: Physician Assistant

## 2012-09-05 ENCOUNTER — Ambulatory Visit (HOSPITAL_BASED_OUTPATIENT_CLINIC_OR_DEPARTMENT_OTHER): Payer: Medicare Other | Admitting: Physician Assistant

## 2012-09-05 ENCOUNTER — Encounter: Payer: Self-pay | Admitting: Physician Assistant

## 2012-09-05 ENCOUNTER — Other Ambulatory Visit: Payer: Medicare Other | Admitting: Lab

## 2012-09-05 ENCOUNTER — Telehealth: Payer: Self-pay | Admitting: Oncology

## 2012-09-05 VITALS — BP 138/86 | HR 71 | Temp 98.1°F | Resp 20 | Ht 63.0 in | Wt 281.6 lb

## 2012-09-05 DIAGNOSIS — G629 Polyneuropathy, unspecified: Secondary | ICD-10-CM | POA: Insufficient documentation

## 2012-09-05 DIAGNOSIS — F419 Anxiety disorder, unspecified: Secondary | ICD-10-CM

## 2012-09-05 DIAGNOSIS — C7952 Secondary malignant neoplasm of bone marrow: Secondary | ICD-10-CM

## 2012-09-05 DIAGNOSIS — I10 Essential (primary) hypertension: Secondary | ICD-10-CM | POA: Insufficient documentation

## 2012-09-05 DIAGNOSIS — C50919 Malignant neoplasm of unspecified site of unspecified female breast: Secondary | ICD-10-CM

## 2012-09-05 DIAGNOSIS — Z5112 Encounter for antineoplastic immunotherapy: Secondary | ICD-10-CM

## 2012-09-05 DIAGNOSIS — G609 Hereditary and idiopathic neuropathy, unspecified: Secondary | ICD-10-CM

## 2012-09-05 DIAGNOSIS — I871 Compression of vein: Secondary | ICD-10-CM

## 2012-09-05 DIAGNOSIS — C7951 Secondary malignant neoplasm of bone: Secondary | ICD-10-CM

## 2012-09-05 DIAGNOSIS — F411 Generalized anxiety disorder: Secondary | ICD-10-CM

## 2012-09-05 LAB — CBC WITH DIFFERENTIAL/PLATELET
Basophils Absolute: 0 10*3/uL (ref 0.0–0.1)
EOS%: 2.1 % (ref 0.0–7.0)
Eosinophils Absolute: 0.2 10*3/uL (ref 0.0–0.5)
HGB: 13.2 g/dL (ref 11.6–15.9)
MCH: 27.3 pg (ref 25.1–34.0)
NEUT#: 5.9 10*3/uL (ref 1.5–6.5)
RDW: 15.9 % — ABNORMAL HIGH (ref 11.2–14.5)
lymph#: 3 10*3/uL (ref 0.9–3.3)

## 2012-09-05 LAB — COMPREHENSIVE METABOLIC PANEL (CC13)
ALT: 10 U/L (ref 0–55)
Albumin: 3.6 g/dL (ref 3.5–5.0)
Alkaline Phosphatase: 105 U/L (ref 40–150)
Glucose: 116 mg/dl — ABNORMAL HIGH (ref 70–99)
Potassium: 3.4 mEq/L — ABNORMAL LOW (ref 3.5–5.1)
Sodium: 142 mEq/L (ref 136–145)
Total Protein: 8.7 g/dL — ABNORMAL HIGH (ref 6.4–8.3)

## 2012-09-05 MED ORDER — MORPHINE SULFATE 15 MG PO TABS: 15.0000 mg | ORAL_TABLET | ORAL | Status: DC | PRN

## 2012-09-05 MED ORDER — DIPHENHYDRAMINE HCL 25 MG PO CAPS
50.0000 mg | ORAL_CAPSULE | Freq: Once | ORAL | Status: AC
  Administered 2012-09-05: 50 mg via ORAL

## 2012-09-05 MED ORDER — MORPHINE SULFATE ER 60 MG PO TBCR: 60.0000 mg | EXTENDED_RELEASE_TABLET | Freq: Two times a day (BID) | ORAL | Status: DC

## 2012-09-05 MED ORDER — LORAZEPAM 2 MG/ML IJ SOLN: 1.0000 mg | Freq: Once | INTRAMUSCULAR | Status: DC | PRN

## 2012-09-05 MED ORDER — SODIUM CHLORIDE 0.9 % IV SOLN
Freq: Once | INTRAVENOUS | Status: AC
  Administered 2012-09-05: 11:00:00 via INTRAVENOUS

## 2012-09-05 MED ORDER — SODIUM CHLORIDE 0.9 % IJ SOLN
10.0000 mL | INTRAMUSCULAR | Status: DC | PRN
  Administered 2012-09-05: 10 mL
  Filled 2012-09-05: qty 10

## 2012-09-05 MED ORDER — HEPARIN SOD (PORK) LOCK FLUSH 100 UNIT/ML IV SOLN
500.0000 [IU] | Freq: Once | INTRAVENOUS | Status: AC | PRN
  Administered 2012-09-05: 250 [IU]
  Filled 2012-09-05: qty 5

## 2012-09-05 MED ORDER — TRASTUZUMAB CHEMO INJECTION 440 MG
6.0000 mg/kg | Freq: Once | INTRAVENOUS | Status: AC
  Administered 2012-09-05: 756 mg via INTRAVENOUS
  Filled 2012-09-05: qty 36

## 2012-09-05 NOTE — Patient Instructions (Addendum)
[facility] Discharge Instructions for Patients Receiving Chemotherapy  Today you received the following chemotherapy agents Herceptin.  To help prevent nausea and vomiting after your treatment, we encourage you to take your nausea medication.   If you develop nausea and vomiting that is not controlled by your nausea medication, call the clinic. If it is after clinic hours your family physician or the after hours number for the clinic or go to the Emergency Department.   BELOW ARE SYMPTOMS THAT SHOULD BE REPORTED IMMEDIATELY:  *FEVER GREATER THAN 100.5 F  *CHILLS WITH OR WITHOUT FEVER  NAUSEA AND VOMITING THAT IS NOT CONTROLLED WITH YOUR NAUSEA MEDICATION  *UNUSUAL SHORTNESS OF BREATH  *UNUSUAL BRUISING OR BLEEDING  TENDERNESS IN MOUTH AND THROAT WITH OR WITHOUT PRESENCE OF ULCERS  *URINARY PROBLEMS  *BOWEL PROBLEMS  UNUSUAL RASH Items with * indicate a potential emergency and should be followed up as soon as possible.  One of the nurses will contact you 24 hours after your treatment. Please let the nurse know about any problems that you may have experienced. Feel free to call the clinic you have any questions or concerns. The clinic phone number is [phone].   I have been informed and understand all the instructions given to me. I know to contact the clinic, my physician, or go to the Emergency Department if any problems should occur. I do not have any questions at this time, but understand that I may call the clinic during office hours   should I have any questions or need assistance in obtaining follow up care.    __________________________________________  _____________  __________ Signature of Patient or Authorized Representative            Date                   Time    __________________________________________ Nurse's Signature    

## 2012-09-05 NOTE — Telephone Encounter (Signed)
Per staff phone call I have adjusted appt. [name] [name]

## 2012-09-05 NOTE — Telephone Encounter (Signed)
apts made and printed for pt dr bensimon is not accepting new pts unless they are heart failure.      [name]

## 2012-09-07 NOTE — Progress Notes (Signed)
ID: [name]   DOB: 02-24-1951  MR#: [id number]  CSN#:[id number]  PCP: PROCHNAU,CAROLINE, MD GYN:  SU:  OTHER MD:   HISTORY OF PRESENT ILLNESS:   This woman has been in good health all of her life.  She noted a swelling and discomfort in her right breast in June of this year.  She was seen in the Emergency Room in [place] and was treated for mastitis.  She was treated for a number of months with mastitis and the swelling did not get better. She was given hydrocodone and Cipro.  Finally, the swelling did get better and ultimately the nipple became retracted and she noticed some dimpling in her skin. She had a mammogram in July of this year in [place] with subsequent mammogram on May 27, 2003, by Dr. Yolanda Bonine.  Mammogram done on September 30 showed marked increased density in the left breast.  Biopsy was performed the same day.  It was noted at the 12 o'clock position, deep in the breast was a focal hypoechoic mass, at least 3.5 cm in diameter.  Biopsy did in fact show invasive in situ mammary carcinoma. This was felt to be both at least intermediate, high grade.  No definite lymphovascular invasion was identified. ER and PR, Her2 testing is pending.  Austen continues to have pain in her breast.  She continues to take hydrocodone a number of times a day.  She has been seen by Dr. Earlene Plater, who felt that neoadjuvant chemotherapy would be required.    Initial staging studies showed evidence of liver and lung mets.   Patient also has evidence of bone lesions. Patient started neoadjuvant chemotherapy, Taxotere/Carbo/Herceptin in October 2004.   Patient had a CT scan in December 2004 which demonstrated extensive clot in the SVC innominate vein, bilateral jugular vein and  She was started on anticoagulation therapy.  She received a total of 6 cycles of Taxotere/Carbo/Herceptin, completed in April 2005, after which she began single agent Herceptin, given every 3 weeks.     INTERVAL HISTORY: [name]  returns today accompanied by her husband, [name], for follow up of her metastatic breast cancer.  She continues to receive trastuzumab every 3 weeks and is due for dosing today.  A recent echo has shown a slight decrease in EF to 50-55% which is being followed closely.    REVIEW OF SYSTEMS: [name] continues to complain of fatigue, problems sleeping, hot flashes, and night sweats.  She take Xanax for anxiety and insomnia with slight relief.  She complains of depression, but no suicidal ideations.  She has chronic pain in the chest wall, knees, and feet.  She takes MSContin and MSIR which controls the pain reasonably well, and continues on gabapentin TID for neuropathic pain.   She has some sinus congestion and a hoarse voice. She denies fevers or chills.   She has occasional heartburn, but no nausea or emesis.  No change in bowel habits.  She has shortness of breath with exertion, but no cough or orthopnea.  She has chronic pedal edema, unchanged.  She has occasional headaches, no dizziness.    A detailed review of systems is otherwise noncontributory.  PAST MEDICAL HISTORY: Past Medical History  Diagnosis Date  . Breast cancer     mets to liver and lung  . Hypertension   . SVC syndrome   . History of chemotherapy Feb. 2006    taxotere/herceptin/carboplatin  . Radiation 07/31/2006    left upper chest  . Radiation 06/17/2006-06/27/2006  6480 cGy bilat. chest wall    PAST SURGICAL HISTORY: Past Surgical History  Procedure Date  . Tubal ligation 1986  . Cholecystectomy 1989    FAMILY HISTORY History reviewed. No pertinent family history. She had three brothers, one died of gunshot wound and one of complications of diabetes mellitus and one of myocardial infarction.  She has no sisters.  Mother died of complications of brain metastasis in 20.  Father has had a myocardial infarction in 1999.  No history of breast cancer in the family.     GYNECOLOGIC HISTORY:  :   Menarche at age 87.   Continues to have menopause.  Gravida 3, para 3.  First live birth at age 45.  No history of breast feeding. No history of hormonal replacement therapy.   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  She has three children, ages 38 ([name] who lived is Copywriter, advertising), 20 ([name] who lives in [place] and works as a Naval architect) and 28 [name] who lives with the patient).   Her husband [name] works Nurse, mental health [employer].      ADVANCED DIRECTIVES:  Not in place  HEALTH MAINTENANCE: History  Substance Use Topics  . Smoking status: Never Smoker   . Smokeless tobacco: Never Used  . Alcohol Use: Yes     Comment: occasional     Colonoscopy: Never  PAP:  1987  Bone density:  Never  Lipid panel:  Allergies  Allergen Reactions  . Adhesive (Tape) Other (See Comments)    Tears skin   . Penicillins Hives    Current Outpatient Prescriptions  Medication Sig Dispense Refill  . albuterol-ipratropium (COMBIVENT) 18-103 MCG/ACT inhaler Inhale 2 puffs into the lungs every 6 (six) hours as needed for wheezing.  1 Inhaler  0  . ALPRAZolam (XANAX) 1 MG tablet Take 1 tablet (1 mg total) by mouth 3 (three) times daily as needed for sleep.  90 tablet  1  . AmLODIPine Besylate (NORVASC PO) Take 10 mg by mouth every morning.       . cyclobenzaprine (FLEXERIL) 10 MG tablet TAKE 1 TABLET BY MOUTH 3 TIMES A DAY AS NEEDED FOR MUSCLE SPASM  90 tablet  0  . Eszopiclone 3 MG TABS Take 1 tablet (3 mg total) by mouth at bedtime. Take immediately before bedtime  30 tablet  1  . furosemide (LASIX) 80 MG tablet Take 40 mg by mouth every morning.       . gabapentin (NEURONTIN) 300 MG capsule TAKE 2 CAPSULES BY MOUTH 3 TIMES DAILY  180 capsule  3  . losartan (COZAAR) 100 MG tablet Take 100 mg by mouth every morning.       Marland Kitchen morphine (MS CONTIN) 60 MG 12 hr tablet Take 1 tablet (60 mg total) by mouth 2 (two) times daily.  60 tablet  0  . morphine  (MSIR) 15 MG tablet Take 1 tablet (15 mg total) by mouth every 4 (four) hours as needed.  30 tablet  0  . Potassium Chloride (K-TABS PO) Take 40 mEq by mouth daily.       . prochlorperazine (COMPAZINE) 10 MG tablet Take 10 mg by mouth every 6 (six) hours as needed. nausea      . warfarin (COUMADIN) 2 MG tablet TAKE 1 TABLET BY MOUTH EVERY DAY AT 4 PM  30 tablet  0  . warfarin (COUMADIN) 2.5 MG tablet Take  1 tablet (2.5 mg total) by mouth daily.  30 tablet  2  . warfarin (COUMADIN) 5 MG tablet TAKE 1 TABLET BY MOUTH EVERY DAY AS DIRECTED  30 tablet  2  . zolpidem (AMBIEN) 5 MG tablet Take 1 tablet (5 mg total) by mouth at bedtime as needed for sleep.  30 tablet  2    OBJECTIVE:  Middle aged African American female in no acute distress. Filed Vitals:   09/05/12 0847  BP: 138/86  Pulse: 71  Temp: 98.1 F (36.7 C)  Resp: 20     Body mass index is 49.88 kg/(m^2).    ECOG FS: 1 Filed Weights   09/05/12 0847  Weight: 281 lb 9.6 oz (127.733 kg)    Sclerae unicteric Oropharynx clear No cervical or supraclavicular adenopathy Lungs no rales or rhonchi Heart regular rate and rhythm Abdomen soft, nontender to palpation, positive bowel sounds MSK no focal spinal tenderness, no peripheral edema Neuro: nonfocal, alert and oriented x 3 PICC intact in upper right chest wall Breasts:   Status post bilateral mastectomies, no evidence local recurrence.  Axillae benign.  LAB RESULTS: Lab Results  Component Value Date   WBC 9.8 09/05/2012   NEUTROABS 5.9 09/05/2012   HGB 13.2 09/05/2012   HCT 37.5 09/05/2012   MCV 77.5* 09/05/2012   PLT 253 09/05/2012      Chemistry      Component Value Date/Time   NA 142 09/05/2012 0825   NA 139 04/11/2012 0837   K 3.4* 09/05/2012 0825   K 3.8 04/11/2012 0837   CL 105 09/05/2012 0825   CL 101 04/11/2012 0837   CO2 25 09/05/2012 0825   CO2 27 04/11/2012 0837   BUN 16.0 09/05/2012 0825   BUN 25* 04/11/2012 0837   CREATININE 0.9 09/05/2012 0825   CREATININE 1.12*  04/11/2012 0837      Component Value Date/Time   CALCIUM 9.3 09/05/2012 0825   CALCIUM 8.7 04/11/2012 0837   ALKPHOS 105 09/05/2012 0825   ALKPHOS 87 04/11/2012 0837   AST 12 09/05/2012 0825   AST 19 04/11/2012 0837   ALT 10 09/05/2012 0825   ALT 14 04/11/2012 0837   BILITOT 0.94 09/05/2012 0825   BILITOT 0.8 04/11/2012 0837       Lab Results  Component Value Date   LABCA2 22 08/15/2012      STUDIES:  Most recent echo on 07/29/2012 showed an EF of 50-55%.    08/15/2012   Noninvasive Vascular Lab  Right Upper Extremity Venous Duplex Evaluation  Patient: Yolanda, Davis MR #: [id number] Study Date: 08/15/2012 Gender: F Age: 62 Height: Weight: BSA: Pt. Status: Room:  [name] [facility] SONOGRAPHER [facility], [facility] Reports also to:  ------------------------------------------------------------ History and indications:  Indications  729.81 Swelling of limb. History  Diagnostic evaluation.  ------------------------------------------------------------ Study information:  Study status: Routine. Procedure: A vascular evaluation was performed with the patient in the supine position. Image quality was adequate. The right internal jugular, right subclavian, right axillary, right basilic, right cephalic, right brachial, right radial, right ulnar, left internal jugular and left subclavian veins were studied. Right upper extremity venous duplex study. Doppler flow study including B-mode compression maneuvers of all visualized segments, color flow Doppler and selected views of pulsed wave Doppler. Location: Vascular laboratory. Patient status: Outpatient.  Venous flow:  +----------------------+-------+---------------------------+ Location OverallFlow properties  +----------------------+-------+---------------------------+ Right internal jugularPatent Phasic; spontaneous;    compressible   +----------------------+-------+---------------------------+ Right subclavian Patent Phasic;  spontaneous;    compressible  +----------------------+-------+---------------------------+ Right axillary Patent Phasic; spontaneous;    compressible  +----------------------+-------+---------------------------+ Right brachial Patent Phasic; spontaneous;    compressible  +----------------------+-------+---------------------------+ Right cephalic Patent Compressible  +----------------------+-------+---------------------------+ Right basilic Patent Compressible  +----------------------+-------+---------------------------+ Right radial Patent Compressible  +----------------------+-------+---------------------------+ Right ulnar Patent Compressible  +----------------------+-------+---------------------------+ Left subclavian Patent Phasic; spontaneous;    compressible  +----------------------+-------+---------------------------+  ------------------------------------------------------------ Summary: No evidence of deep vein or superficial thrombosis involving the right upper extremity, left subclavian vein, and left internal jugular vein.  Other specific details can be found in the table(s) above. Prepared and Electronically Authenticated by  Sherren Kerns. 2014-01-03T13:00:[id number]   05/21/2012  *RADIOLOGY REPORT*  Clinical Data: Subsequent treatment strategy for restaging of  breast cancer.  NUCLEAR MEDICINE PET SKULL BASE TO THIGH  Fasting Blood Glucose: 114  Technique: 19.2 mCi F-18 FDG was injected intravenously. CT data  was obtained and used for attenuation correction and anatomic  localization only. (This was not acquired as a diagnostic CT  examination.) Additional exam technical data entered on  technologist worksheet.  Comparison: PET and diagnostic chest, abdomen, and pelvic CTs of  11/19/2011.  Findings:  Neck: Mild  to moderate degradation secondary to patient body  habitus. Palatine tonsil hypermetabolism is slightly asymmetric  and greater on the right than left, but favored to be physiologic.  Chest: There is low level hypermetabolism corresponding to skin  thickening within the anterior chest wall. This is likely  postoperative and/or treatment related.Postoperative fluid  collection in the left chest wall is similar in size to on the  prior exam and demonstrates mild peripheral hypermetabolism,  favored to be postoperative.  Mildly increased density within the anterior mediastinum,  corresponding to low level hypermetabolism. This measures a S.U.V.  max of 3.5, including on image 73. Favored to be due to thymic  rebound/hyperplasia.  Abdomen/Pelvis: No abnormal hypermetabolism.  Skeleton: Improvement in hypermetabolism centered about the  sternomanubrial joint. This measures a S.U.V. max of 4.6 and  corresponds to periarticular osseous irregularity, including on  prior diagnostic CT. On the prior PET, this measured a S.U.V. max  of 5.0.  Equivocal hypermetabolism involving the posterior aspect of the  left iliac bone. No CT correlate. This measures a S.U.V. max of  5.0 on image 191.  CT images performed for attenuation correction demonstrate no  significant findings within the neck. A right-sided internal  jugular line which terminates in the right atrium. Right rotator  cuff lipoma. 8-mm left lower lobe lung nodule described on the  prior exam is not readily apparent today. A small hiatal hernia  with moderate cardiomegaly. Bilateral axillary nodal dissection.  Cholecystectomy. Calcified anterior right pelvic lesion measures  3.8 cm on image 205 and is similar,.  IMPRESSION:  1. Mild to moderate degradation secondary patient body habitus.  2. Slight decrease in hypermetabolism surrounding the  sternomanubrial joint. This could represent metastatic disease or  an infectious or inflammatory  arthropathy.  3. Equivocal hypermetabolism within the left iliac bone warrants  followup attention.  4. No extra osseous metastatic disease identified. Probable  thymic hyperplasia/rebound within the anterior mediastinum.  5. Similar calcified right pelvic lesion. Not hypermetabolic.  Favored to represent an exophytic fibroid. An ovarian lesion could  look similar. Favor a benign entity. Recommend attention on follow-  up.  Original Report Authenticated By: Consuello Bossier, M.D.   11/19/3011   *RADIOLOGY REPORT*  Clinical Data: History of breast cancer. Status post bilateral  mastectomy. Chemotherapy in progress. Restaging  scan.  CT CHEST, ABDOMEN AND PELVIS WITH CONTRAST  Technique: Multidetector CT imaging of the chest, abdomen and  pelvis was performed following the standard protocol during bolus  administration of intravenous contrast.  Contrast: 100 ml of Omnipaque-300.  Comparison: PET CT 09/01/2010. Chest abdomen and pelvis CT  03/08/2011.  CT CHEST  Findings:  Mediastinum: Heart size is mildly enlarged. There is no significant  pericardial fluid, thickening or pericardial calcification. No  pathologically enlarged mediastinal or hilar lymph nodes. New right  internal jugular central venous catheter with tip terminating in  the right atrium. Previously noted left-sided Port-A-Cath has been  removed. Esophagus is unremarkable in appearance.  Lungs/Pleura: Image 31 of series 4 demonstrates a new 8 mm nodule  in the left lower lobe. No other suspicious appearing pulmonary  nodules or masses are otherwise identified. No consolidative  airspace disease. No pleural effusions. Minimal subpleural  reticulation is noted in the anterolateral aspect of the right  upper lobe, similar to prior study from 03/08/2011.  Musculoskeletal: Postoperative changes of bilateral modified  radical mastectomy and axillary nodal dissection are again noted.  In the lateral aspect of the left breast  parenchyma inferior and  lateral to the left pectoralis major muscle there is a small  postoperative fluid collection with a thick rim of soft tissue  which measures approximately 4.1 x 1.3 cm. This is similar in size  to the prior examination. Within the manubrium and sternum  centered around the manubrial sternal joint there are new areas of  sclerosis, which correspond with regions of hypermetabolic activity  on the PET portion of the examination), concerning for skeletal  metastasis. No other definite aggressive appearing lytic or  blastic bony lesion is identified within the visualized portions of  the skeleton.  IMPRESSION:  1. Interval development of a new ill-defined 8 mm left lower lobe  pulmonary nodule, which is nonspecific in appearance, but warrants  close attention on short-term followup study in 3 months, as  metastatic disease is not excluded.  2. Interval development of the expansion and sclerosis of the  inferior aspect of the manubrium and superior aspect of the sternum  centered around the manubriosternal joint, with corresponding  hypermetabolic activity on contemporaneous PET examination,  concerning for a new skeletal metastasis.  3. Although the postoperative fluid collection in the lateral  aspect of the left breast is similar in size and appearance on the  CT portion of today's examination, there was hypermetabolic  activity within the superior lateral aspect of this collection on  the contemporaneous PET examination, which could suggest recurrent  disease. Continued attention on follow-up studies is recommended.  CT ABDOMEN AND PELVIS  Findings:  Abdomen/Pelvis: Status post cholecystectomy. Minimal intrahepatic  biliary ductal dilatation (unchanged). No extrahepatic biliary  ductal dilatation. No focal hepatic lesions are identified. The  enhanced appearance of the pancreas, spleen, bilateral adrenal  glands and bilateral kidneys is unremarkable.  There is  no ascites or pneumoperitoneum and no pathologic  distension of bowel. No definite pathologic adenopathy identified  within the abdomen or pelvis. There are numerous colonic  diverticula, predominantly in the sigmoid and descending colon,  without surrounding inflammatory changes to suggest acute  diverticulitis at this time. The uterus, left ovary and urinary  bladder are unremarkable in appearance. The right ovary appears  enlarged with two rim calcified lesions, which overall appear  unchanged in size and appearance compared to the prior examination  from 03/08/2011, presumably a benign finding.  Musculoskeletal:  A well defined lucent lesion with sclerotic  margins and narrow zone of transition is noted in the anterior  aspect of the right femoral head, unchanged compared to prior  examination (with no corresponding hypermetabolic activity on the  contemporaneous PET examination), likely represent a large  subchondral cyst. There are no aggressive appearing lytic or  blastic lesions noted in the visualized portions of the skeleton.  IMPRESSION:  1. No findings to suggest the presence of metastatic disease in  the abdomen or pelvis on today's examination.  2. Status post cholecystectomy.  3. Colonic diverticulosis without findings to suggest acute  diverticulitis.  Original Report Authenticated By: Florencia Reasons, M.D.    ASSESSMENT: 62 y.o.  [place], [place], woman  (1)  with a history of metastatic HER-2 positive breast carcinoma originally diagnosed September 2004 and originally treated with docetaxel/carboplatin and trastuzumab, ER/PR negative, HER-2 positive  (2)  Status post radiation in October - November 2007, 6480 cGY bilaterally to chest wall;  Also status post radiation to left upper chest in December 2007.  (3)   on maintenance q.3-week Herceptin.  Most recent echo in December 2013 showing slight decrease in EF to 50-55%.  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   (5)  History of chemotherapy-induced neuropathy. On Neurontin 600 mg p.o. T.i.d., MSContin, and MSIR.  (6)  History of hypertension, managed by Dr. Sudie Bailey.  (7)  Generalized Anxiety Disorder, takes Xanax    PLAN: [name] will proceed to treatment today as planned for her next q 3 week dose of trastuzumab. Before her next dose on 09/26/12, we will repeat an echo and schedule her to meet with Dr. Gala Romney to evaluate the recent decrease in her EF.  She will meet with Dr. Darnelle Catalan wen she returns on 1/31 to further assess her treatment plan.    Dr. Darnelle Catalan is interested in discussing initiation of zoledronic acid for [name]'s bone mets.  She had previously wished to hold off on any additional treatment other than the trastuzumab until it was "absolutely necessary".  She understands that she will need a full dental evaluation prior to initiating bisphosphonate therapy.  (Most recent creatinine was stable at 0.9 on 09/05/2012.)  I have refilled [name]'s pain meds, specifically her MSContin and short-acting MSIR.  We discussed the possibility of trying either Lexapro or Celexa for anxiety and depression, and [name] will think about that possibility.  We will re-address and her next appointment.  [name] and [name] both voice their understanding and agreement with this plan and will call with any questions or problems prior to her next visit.    Alexandrea Westergard    09/05/2012

## 2012-09-11 ENCOUNTER — Ambulatory Visit (INDEPENDENT_AMBULATORY_CARE_PROVIDER_SITE_OTHER): Payer: Medicare Other | Admitting: *Deleted

## 2012-09-11 ENCOUNTER — Other Ambulatory Visit (HOSPITAL_COMMUNITY): Payer: Self-pay | Admitting: Physician Assistant

## 2012-09-11 ENCOUNTER — Other Ambulatory Visit (HOSPITAL_COMMUNITY): Payer: Self-pay | Admitting: Radiology

## 2012-09-11 ENCOUNTER — Other Ambulatory Visit: Payer: Self-pay | Admitting: Oncology

## 2012-09-11 ENCOUNTER — Ambulatory Visit (INDEPENDENT_AMBULATORY_CARE_PROVIDER_SITE_OTHER): Payer: Medicare Other | Admitting: Cardiovascular Disease

## 2012-09-11 ENCOUNTER — Ambulatory Visit (HOSPITAL_COMMUNITY): Payer: Medicare Other | Attending: Cardiology | Admitting: Radiology

## 2012-09-11 ENCOUNTER — Encounter: Payer: Self-pay | Admitting: Cardiovascular Disease

## 2012-09-11 VITALS — BP 130/96 | HR 81 | Ht 63.0 in | Wt 293.0 lb

## 2012-09-11 DIAGNOSIS — I428 Other cardiomyopathies: Secondary | ICD-10-CM

## 2012-09-11 DIAGNOSIS — I509 Heart failure, unspecified: Secondary | ICD-10-CM

## 2012-09-11 DIAGNOSIS — Z5111 Encounter for antineoplastic chemotherapy: Secondary | ICD-10-CM

## 2012-09-11 DIAGNOSIS — I059 Rheumatic mitral valve disease, unspecified: Secondary | ICD-10-CM | POA: Insufficient documentation

## 2012-09-11 DIAGNOSIS — I5032 Chronic diastolic (congestive) heart failure: Secondary | ICD-10-CM | POA: Insufficient documentation

## 2012-09-11 DIAGNOSIS — I10 Essential (primary) hypertension: Secondary | ICD-10-CM | POA: Insufficient documentation

## 2012-09-11 DIAGNOSIS — C50919 Malignant neoplasm of unspecified site of unspecified female breast: Secondary | ICD-10-CM

## 2012-09-11 DIAGNOSIS — C801 Malignant (primary) neoplasm, unspecified: Secondary | ICD-10-CM | POA: Insufficient documentation

## 2012-09-11 DIAGNOSIS — E669 Obesity, unspecified: Secondary | ICD-10-CM | POA: Insufficient documentation

## 2012-09-11 NOTE — Progress Notes (Signed)
Pt was placed on Dr Harvie Bridge schedule and was to be seen by Dr Teressa Lower for cancer/ cardiology protocol, echo was done today along with an ekg, I called the heart failure clinic and got an app for 09/18/12@2 :16 Dr Elease Hashimoto spoke with the pt and Doylene Bode RN made aware.

## 2012-09-11 NOTE — Progress Notes (Signed)
Echocardiogram performed.  

## 2012-09-11 NOTE — Patient Instructions (Signed)
YOU HAVE AN APP WITH DR Milas Kocher 09/18/12 @2 :30  IT WAS NICE MEETING YOU TODAY, SORRY FOR THE INCONVENIENCE THIS CAUSED YOU TODAY.

## 2012-09-11 NOTE — Progress Notes (Signed)
  Pt was placed on Dr Harvie Bridge schedule and per referring note she was to be seen by Dr Teressa Lower for cancer/ cardiology protocol, echo was done today along with an ekg, I called the heart failure clinic and made an app for 09/18/12@2 :30 and made them aware of appointment mishap, appointment backed out of system and I made her a nurse visit with ekg,   Dr Elease Hashimoto spoke with the pt and Doylene Bode RN made aware.

## 2012-09-17 ENCOUNTER — Other Ambulatory Visit: Payer: Self-pay | Admitting: Physician Assistant

## 2012-09-18 ENCOUNTER — Ambulatory Visit (HOSPITAL_COMMUNITY)
Admission: RE | Admit: 2012-09-18 | Discharge: 2012-09-18 | Disposition: A | Discharge status: Home / Self Care | Payer: Medicare Other | Source: Ambulatory Visit | Attending: Internal Medicine | Admitting: Internal Medicine

## 2012-09-18 ENCOUNTER — Encounter (HOSPITAL_COMMUNITY): Payer: Self-pay

## 2012-09-18 VITALS — BP 126/98 | HR 88 | Wt 288.4 lb

## 2012-09-18 DIAGNOSIS — R609 Edema, unspecified: Secondary | ICD-10-CM | POA: Insufficient documentation

## 2012-09-18 DIAGNOSIS — I509 Heart failure, unspecified: Secondary | ICD-10-CM

## 2012-09-18 DIAGNOSIS — R600 Localized edema: Secondary | ICD-10-CM

## 2012-09-18 DIAGNOSIS — C50919 Malignant neoplasm of unspecified site of unspecified female breast: Secondary | ICD-10-CM | POA: Insufficient documentation

## 2012-09-18 DIAGNOSIS — Z0000 Encounter for general adult medical examination without abnormal findings: Secondary | ICD-10-CM

## 2012-09-18 MED ORDER — SPIRONOLACTONE 25 MG PO TABS: 12.5000 mg | ORAL_TABLET | Freq: Every day | ORAL | Status: DC

## 2012-09-18 NOTE — Progress Notes (Signed)
Patient ID: [name], female   DOB: 1950-11-17, 62 y.o.   MRN: [id number]  HPI:  [name] is a 62 year old with a history of metastatic HER-2 positive breast carcinoma originally diagnosed September 2004. Started in R breast. Underwent neo-adjuvant chemo. During this time developed L breast CA. Underwent bilateral mastectomy. Lymph nodes +. 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   She received a total of 6 cycles of Taxotere/Carbo/Herceptin, completed in April 2005, after which she began single agent Herceptin, given every 3 weeks for 9 years.    Denies any h/o known heart disease. Does have HTN (takes losartan and amlodipine), obesity and insomnia.   04/23/12 ECHO EF 60-65% Lateral s' 8.9 cm/s 07/30/12 ECHO EF 60-65% Lateral s' 8.3 cm/s 09/11/12 ECHO EF 60-65% Lateral s' 10.3 cm/s  She is referred to HF clinic by Dr. Darnelle Catalan for follow-up in the cardio-oncology clinic. Does has occasional DOE with going up hill or walking fast. +LE edema which she attributes to lymphedema. Takes lasix as needed (limited by cramps).   Echo read in 12/14 showed mild drop in EF which apparently recovered on most recent echo last week. Has not noticed any change in DOE or swelling.     Review of Systems:     Cardiac Review of Systems: {Y] = yes [ ]  = no  Chest Pain [    ]  Resting SOB [   ] Exertional SOB  [ y ]  Orthopnea [  ]   Pedal Edema [   ]    Palpitations [  ] Syncope  [  ]   Presyncope [   ]  General Review of Systems: [Y] = yes [  ]=no Constitional: recent weight change [  ]; anorexia [  ]; fatigue [  ]; nausea [  ]; night sweats [  ]; fever [  ]; or chills [  ];                                                                                                                Eye : blurred vision [  ]; diplopia [   ]; vision changes [  ];  Amaurosis fugax[  ]; Resp: cough [  ];  wheezing[  ];  hemoptysis[  ]; shortness of breath[  ]; paroxysmal  nocturnal dyspnea[  ]; dyspnea on exertion[  ]; or orthopnea[  ];  GI:  gallstones[  ], vomiting[  ];  dysphagia[  ]; melena[  ];  hematochezia [  ]; heartburn[  ];   Hx of  Colonoscopy[  ]; GU: kidney stones [  ]; hematuria[  ];   dysuria [  ];  nocturia[  ];  history of     obstruction [  ];                 Skin: rash, swelling[  ];, hair loss[  ];  peripheral edema[y  ];  or itching[  ]; Musculosketetal: myalgias[ y ];  joint swelling[  ];  joint erythema[  ];  joint pain[ Y ];  back pain[  ];  Heme/Lymph: bruising[  ];  bleeding[  ];  anemia[  ];  Neuro: TIA[  ];  headaches[  ];  stroke[  ];  vertigo[  ];  seizures[  ];   paresthesias[y  ];  difficulty walking[  ];  Psych:depression[  ]; anxiety[ y ];  Endocrine: diabetes[  ];  thyroid dysfunction[  ];  Other:    Past Medical History  Diagnosis Date  . Breast cancer     mets to liver and lung  . Hypertension   . SVC syndrome   . History of chemotherapy Feb. 2006    taxotere/herceptin/carboplatin  . Radiation 07/31/2006    left upper chest  . Radiation 06/17/2006-06/27/2006    6480 cGy bilat. chest wall    Current Outpatient Prescriptions  Medication Sig Dispense Refill  . albuterol-ipratropium (COMBIVENT) 18-103 MCG/ACT inhaler Inhale 2 puffs into the lungs every 6 (six) hours as needed for wheezing.  1 Inhaler  0  . ALPRAZolam (XANAX) 1 MG tablet Take 1 tablet (1 mg total) by mouth 3 (three) times daily as needed for sleep.  90 tablet  1  . AmLODIPine Besylate (NORVASC PO) Take 10 mg by mouth every morning.       . cyclobenzaprine (FLEXERIL) 10 MG tablet TAKE 1 TABLET BY MOUTH 3 TIMES A DAY AS NEEDED FOR MUSCLE SPASM  90 tablet  0  . Eszopiclone 3 MG TABS Take 3 mg by mouth as needed. Take immediately before bedtime      . furosemide (LASIX) 80 MG tablet Take 40 mg by mouth as needed.       . gabapentin (NEURONTIN) 300 MG capsule       . losartan (COZAAR) 100 MG tablet Take 100 mg by mouth every morning.       Marland Kitchen morphine (MS  CONTIN) 60 MG 12 hr tablet Take 1 tablet (60 mg total) by mouth 2 (two) times daily.  60 tablet  0  . morphine (MSIR) 15 MG tablet Take 1 tablet (15 mg total) by mouth every 4 (four) hours as needed.  30 tablet  0  . Potassium Chloride (K-TABS PO) Take 40 mEq by mouth as needed.       . prochlorperazine (COMPAZINE) 10 MG tablet Take 10 mg by mouth every 6 (six) hours as needed. nausea      . warfarin (COUMADIN) 5 MG tablet TAKE 1 TABLET BY MOUTH EVERY DAY AS DIRECTED  30 tablet  2  . zolpidem (AMBIEN) 5 MG tablet Take 1 tablet (5 mg total) by mouth at bedtime as needed for sleep.  30 tablet  2     Allergies  Allergen Reactions  . Adhesive (Tape) Other (See Comments)    Tears skin   . Penicillins Hives    History   Social History  . Marital Status: Married    Spouse Name: N/A    Number of Children: N/A  . Years of Education: N/A   Occupational History  . Not on file.   Social History Main Topics  . Smoking status: Never Smoker   . Smokeless tobacco: Never Used  . Alcohol Use: Yes     Comment: occasional  . Drug Use: No  . Sexually Active:    Other Topics Concern  . Not on file   Social History Narrative  . No narrative on file    No family  history on file.  PHYSICAL EXAM: Filed Vitals:   09/18/12 1402  BP: 126/98  Pulse: 88   General:  Well appearing. No respiratory difficulty HEENT: normal Neck: supple. no JVD. Carotids 2+ bilat; no bruits. No lymphadenopathy or thryomegaly appreciated. Cor: PMI nondisplaced. Regular rate & rhythm. No rubs, gallops or murmurs. Port-a-cath. S/p bilateral mastectomies Lungs: clear Abdomen: obese. soft, nontender, nondistended.  Good bowel sounds. Extremities: no cyanosis, clubbing, rash, 1+ edema Neuro: alert & oriented x 3, cranial nerves grossly intact. moves all 4 extremities w/o difficulty. Affect pleasant.    ASSESSMENT & PLAN:

## 2012-09-18 NOTE — Patient Instructions (Addendum)
Start Spironolactone 12.5 mg (1/2 tab) daily  Labs in 1 week with Dr Darnelle Catalan  We will contact you in 3 months to schedule your next appointment and echocardiogram

## 2012-09-19 DIAGNOSIS — R600 Localized edema: Secondary | ICD-10-CM | POA: Insufficient documentation

## 2012-09-19 NOTE — Assessment & Plan Note (Signed)
Likely venous stasis. Will start spiro 12.5mg  daily. She will have f/u [facility] at the [facility] next week

## 2012-09-19 NOTE — Assessment & Plan Note (Signed)
Role of cardio-oncology clinic reviewed in detail with her. Multiple echoes reviewed personally. In looking at her echos, her December 2013 echo was misinterpreted. Her EF remains very stable in 60-65% range. There has been no evidence of cardiotoxicity by exam or on echo. Would continue Herceptin. We will follow q101months.

## 2012-09-19 NOTE — Addendum Note (Signed)
Encounter addended by: Dolores Patty, MD on: 09/19/2012  7:57 PM<BR>     Documentation filed: Follow-up Section, LOS Section, Patient Instructions Section

## 2012-09-26 ENCOUNTER — Ambulatory Visit (HOSPITAL_BASED_OUTPATIENT_CLINIC_OR_DEPARTMENT_OTHER): Payer: Medicare Other

## 2012-09-26 ENCOUNTER — Other Ambulatory Visit: Payer: Medicare Other | Admitting: Lab

## 2012-09-26 ENCOUNTER — Ambulatory Visit (HOSPITAL_BASED_OUTPATIENT_CLINIC_OR_DEPARTMENT_OTHER): Payer: Medicare Other | Admitting: Oncology

## 2012-09-26 ENCOUNTER — Other Ambulatory Visit (HOSPITAL_BASED_OUTPATIENT_CLINIC_OR_DEPARTMENT_OTHER): Payer: Medicare Other | Admitting: Lab

## 2012-09-26 ENCOUNTER — Ambulatory Visit: Payer: Medicare Other | Admitting: Oncology

## 2012-09-26 VITALS — BP 149/88 | HR 92 | Temp 98.7°F | Resp 20 | Ht 63.0 in | Wt 290.6 lb

## 2012-09-26 DIAGNOSIS — C50119 Malignant neoplasm of central portion of unspecified female breast: Secondary | ICD-10-CM

## 2012-09-26 DIAGNOSIS — Z7901 Long term (current) use of anticoagulants: Secondary | ICD-10-CM

## 2012-09-26 DIAGNOSIS — C7952 Secondary malignant neoplasm of bone marrow: Secondary | ICD-10-CM

## 2012-09-26 DIAGNOSIS — C50219 Malignant neoplasm of upper-inner quadrant of unspecified female breast: Secondary | ICD-10-CM

## 2012-09-26 DIAGNOSIS — C7951 Secondary malignant neoplasm of bone: Secondary | ICD-10-CM

## 2012-09-26 DIAGNOSIS — C50919 Malignant neoplasm of unspecified site of unspecified female breast: Secondary | ICD-10-CM

## 2012-09-26 DIAGNOSIS — Z5112 Encounter for antineoplastic immunotherapy: Secondary | ICD-10-CM

## 2012-09-26 DIAGNOSIS — I871 Compression of vein: Secondary | ICD-10-CM

## 2012-09-26 LAB — CBC WITH DIFFERENTIAL/PLATELET
BASO%: 0.2 % (ref 0.0–2.0)
LYMPH%: 21.7 % (ref 14.0–49.7)
MCHC: 33.8 g/dL (ref 31.5–36.0)
MONO#: 0.8 10*3/uL (ref 0.1–0.9)
MONO%: 6 % (ref 0.0–14.0)
Platelets: 213 10*3/uL (ref 145–400)
RBC: 4.12 10*6/uL (ref 3.70–5.45)
RDW: 16.1 % — ABNORMAL HIGH (ref 11.2–14.5)
WBC: 13.9 10*3/uL — ABNORMAL HIGH (ref 3.9–10.3)
nRBC: 0 % (ref 0–0)

## 2012-09-26 LAB — COMPREHENSIVE METABOLIC PANEL (CC13)
ALT: 10 U/L (ref 0–55)
AST: 15 U/L (ref 5–34)
Chloride: 107 mEq/L (ref 98–107)
Creatinine: 0.8 mg/dL (ref 0.6–1.1)
Sodium: 141 mEq/L (ref 136–145)
Total Bilirubin: 1.19 mg/dL (ref 0.20–1.20)

## 2012-09-26 LAB — PROTIME-INR
INR: 2 (ref 2.00–3.50)
Protime: 24 Seconds — ABNORMAL HIGH (ref 10.6–13.4)

## 2012-09-26 MED ORDER — LORAZEPAM 2 MG/ML IJ SOLN
1.0000 mg | Freq: Once | INTRAMUSCULAR | Status: AC | PRN
  Administered 2012-09-26: 1 mg via INTRAVENOUS

## 2012-09-26 MED ORDER — SODIUM CHLORIDE 0.9 % IV SOLN
Freq: Once | INTRAVENOUS | Status: AC
  Administered 2012-09-26: 11:00:00 via INTRAVENOUS

## 2012-09-26 MED ORDER — DIPHENHYDRAMINE HCL 25 MG PO CAPS
50.0000 mg | ORAL_CAPSULE | Freq: Once | ORAL | Status: AC
  Administered 2012-09-26: 50 mg via ORAL

## 2012-09-26 MED ORDER — SODIUM CHLORIDE 0.9 % IJ SOLN
10.0000 mL | INTRAMUSCULAR | Status: DC | PRN
  Administered 2012-09-26: 10 mL
  Filled 2012-09-26: qty 10

## 2012-09-26 MED ORDER — TRASTUZUMAB CHEMO INJECTION 440 MG
6.0000 mg/kg | Freq: Once | INTRAVENOUS | Status: AC
  Administered 2012-09-26: 756 mg via INTRAVENOUS
  Filled 2012-09-26: qty 36

## 2012-09-26 MED ORDER — HEPARIN SOD (PORK) LOCK FLUSH 100 UNIT/ML IV SOLN
500.0000 [IU] | Freq: Once | INTRAVENOUS | Status: AC | PRN
  Administered 2012-09-26: 500 [IU]
  Filled 2012-09-26: qty 5

## 2012-09-26 NOTE — Progress Notes (Signed)
ID: [name]   DOB: 14-Jan-1951  MR#: [id number]  CSN#:[id number]  PCP: PROCHNAU,CAROLINE, MD GYN:  SU:  OTHER MD:   HISTORY OF PRESENT ILLNESS:  INTERVAL HISTORY:  REVIEW OF SYSTEMS:  PAST MEDICAL HISTORY: Past Medical History  Diagnosis Date  . Breast cancer     mets to liver and lung  . Hypertension   . SVC syndrome   . History of chemotherapy Feb. 2006    taxotere/herceptin/carboplatin  . Radiation 07/31/2006    left upper chest  . Radiation 06/17/2006-06/27/2006    6480 cGy bilat. chest wall    PAST SURGICAL HISTORY: Past Surgical History  Procedure Date  . Tubal ligation 1986  . Cholecystectomy 1989    FAMILY HISTORY No family history on file.  GYNECOLOGIC HISTORY:  SOCIAL HISTORY:    ADVANCED DIRECTIVES:  HEALTH MAINTENANCE: History  Substance Use Topics  . Smoking status: Never Smoker   . Smokeless tobacco: Never Used  . Alcohol Use: Yes     Comment: occasional     Colonoscopy:  PAP:  Bone density:  Lipid panel:  Allergies  Allergen Reactions  . Adhesive (Tape) Other (See Comments)    Tears skin   . Penicillins Hives    Current Outpatient Prescriptions  Medication Sig Dispense Refill  . albuterol-ipratropium (COMBIVENT) 18-103 MCG/ACT inhaler Inhale 2 puffs into the lungs every 6 (six) hours as needed for wheezing.  1 Inhaler  0  . ALPRAZolam (XANAX) 1 MG tablet Take 1 tablet (1 mg total) by mouth 3 (three) times daily as needed for sleep.  90 tablet  1  . AmLODIPine Besylate (NORVASC PO) Take 10 mg by mouth every morning.       . cyclobenzaprine (FLEXERIL) 10 MG tablet TAKE 1 TABLET BY MOUTH 3 TIMES A DAY AS NEEDED FOR MUSCLE SPASM  90 tablet  0  . Eszopiclone 3 MG TABS Take 3 mg by mouth as needed. Take immediately before bedtime      . furosemide (LASIX) 80 MG tablet Take 40 mg by mouth as needed.       . gabapentin (NEURONTIN) 300 MG capsule       . losartan (COZAAR) 100 MG tablet Take 100 mg by mouth every morning.       Marland Kitchen  morphine (MS CONTIN) 60 MG 12 hr tablet Take 1 tablet (60 mg total) by mouth 2 (two) times daily.  60 tablet  0  . morphine (MSIR) 15 MG tablet Take 1 tablet (15 mg total) by mouth every 4 (four) hours as needed.  30 tablet  0  . Potassium Chloride (K-TABS PO) Take 40 mEq by mouth as needed.       . prochlorperazine (COMPAZINE) 10 MG tablet Take 10 mg by mouth every 6 (six) hours as needed. nausea      . spironolactone (ALDACTONE) 25 MG tablet Take 0.5 tablets (12.5 mg total) by mouth daily.  15 tablet  3  . warfarin (COUMADIN) 5 MG tablet TAKE 1 TABLET BY MOUTH EVERY DAY AS DIRECTED  30 tablet  2  . zolpidem (AMBIEN) 5 MG tablet Take 1 tablet (5 mg total) by mouth at bedtime as needed for sleep.  30 tablet  2    OBJECTIVE: Filed Vitals:   09/26/12 0950  BP: 149/88  Pulse: 92  Temp: 98.7 F (37.1 C)  Resp: 20     Body mass index is 51.48 kg/(m^2).    ECOG FS:  Sclerae unicteric Oropharynx clear No cervical  or supraclavicular adenopathy Lungs no rales or rhonchi Heart regular rate and rhythm Abd benign MSK no focal spinal tenderness, no peripheral edema Neuro: nonfocal Breasts:    LAB RESULTS: Lab Results  Component Value Date   WBC 13.9* 09/26/2012   NEUTROABS 9.6* 09/26/2012   HGB 11.1* 09/26/2012   HCT 32.8* 09/26/2012   MCV 79.6 09/26/2012   PLT 213 09/26/2012      Chemistry      Component Value Date/Time   NA 142 09/05/2012 0825   NA 139 04/11/2012 0837   K 3.4* 09/05/2012 0825   K 3.8 04/11/2012 0837   CL 105 09/05/2012 0825   CL 101 04/11/2012 0837   CO2 25 09/05/2012 0825   CO2 27 04/11/2012 0837   BUN 16.0 09/05/2012 0825   BUN 25* 04/11/2012 0837   CREATININE 0.9 09/05/2012 0825   CREATININE 1.12* 04/11/2012 0837      Component Value Date/Time   CALCIUM 9.3 09/05/2012 0825   CALCIUM 8.7 04/11/2012 0837   ALKPHOS 105 09/05/2012 0825   ALKPHOS 87 04/11/2012 0837   AST 12 09/05/2012 0825   AST 19 04/11/2012 0837   ALT 10 09/05/2012 0825   ALT 14 04/11/2012 0837   BILITOT  0.94 09/05/2012 0825   BILITOT 0.8 04/11/2012 0837       Lab Results  Component Value Date   LABCA2 22 08/15/2012    No components found with this basename: [id number]     Lab 09/26/12 0843  INR 2.00    Urinalysis    Component Value Date/Time   COLORURINE YELLOW 05/29/2008 2113   APPEARANCEUR CLEAR 05/29/2008 2113   LABSPEC 1.011 05/29/2008 2113   PHURINE 7.0 05/29/2008 2113   GLUCOSEU NEGATIVE 05/29/2008 2113   HGBUR SMALL* 05/29/2008 2113   BILIRUBINUR NEGATIVE 05/29/2008 2113   KETONESUR NEGATIVE 05/29/2008 2113   PROTEINUR NEGATIVE 05/29/2008 2113   UROBILINOGEN 0.2 05/29/2008 2113   NITRITE NEGATIVE 05/29/2008 2113   LEUKOCYTESUR TRACE* 05/29/2008 2113    STUDIES: No results found.  ASSESSMENT: 62 y.o.  PLAN:   Lowella Dell    09/26/2012

## 2012-09-26 NOTE — Patient Instructions (Signed)
[facility] Discharge Instructions for Patients Receiving Chemotherapy  Today you received the following chemotherapy agents: herceptin  To help prevent nausea and vomiting after your treatment, we encourage you to take your nausea medication.  Take it as often as prescribed.     If you develop nausea and vomiting that is not controlled by your nausea medication, call the clinic. If it is after clinic hours your family physician or the after hours number for the clinic or go to the Emergency Department.   BELOW ARE SYMPTOMS THAT SHOULD BE REPORTED IMMEDIATELY:  *FEVER GREATER THAN 100.5 F  *CHILLS WITH OR WITHOUT FEVER  NAUSEA AND VOMITING THAT IS NOT CONTROLLED WITH YOUR NAUSEA MEDICATION  *UNUSUAL SHORTNESS OF BREATH  *UNUSUAL BRUISING OR BLEEDING  TENDERNESS IN MOUTH AND THROAT WITH OR WITHOUT PRESENCE OF ULCERS  *URINARY PROBLEMS  *BOWEL PROBLEMS  UNUSUAL RASH Items with * indicate a potential emergency and should be followed up as soon as possible.  Feel free to call the clinic you have any questions or concerns. The clinic phone number is [phone].   I have been informed and understand all the instructions given to me. I know to contact the clinic, my physician, or go to the Emergency Department if any problems should occur. I do not have any questions at this time, but understand that I may call the clinic during office hours   should I have any questions or need assistance in obtaining follow up care.    __________________________________________  _____________  __________ Signature of Patient or Authorized Representative            Date                   Time    __________________________________________ Nurse's Signature    

## 2012-09-26 NOTE — Progress Notes (Signed)
ID: [name]   DOB: [birth year]  MR#: [id number]  CSN#:[id number]  PCP: Yolanda Kingdom, MD GYN:  SU:  OTHER MD: [name], [name]   HISTORY OF PRESENT ILLNESS:   From [name] note 06/04/2003: This woman has been in good health all of her life.  She noted a swelling and discomfort in her right breast in June of this year.  She was seen in the Emergency Room in [place] and was treated for mastitis.  She was treated for a number of months with mastitis and the swelling did not get better. She was given hydrocodone and Cipro.  Finally, the swelling did get better and ultimately the nipple became retracted and she noticed some dimpling in her skin. She had a mammogram in July of this year in [place] with subsequent mammogram on May 27, 2003, by [name].  Mammogram done on September 30 showed marked increased density in the left breast.  Biopsy was performed the same day.  It was noted at the 12 o'clock position, deep in the breast was a focal hypoechoic mass, at least 3.5 cm in diameter.  Biopsy did in fact show invasive in situ mammary carcinoma. This was felt to be both at least intermediate, high grade.  No definite lymphovascular invasion was identified. ER and PR, Her2 testing is pending.  [name] continues to have pain in her breast.  She continues to take hydrocodone a number of times a day.  She has been seen by Dr. Earlene Davis, who felt that neoadjuvant chemotherapy would be required.    Initial staging studies showed evidence of liver and lung mets.   Patient also has evidence of bone lesions. Patient started neoadjuvant chemotherapy, Taxotere/Carbo/Herceptin in October 2004.   Patient had a CT scan in December 2004 which demonstrated extensive clot in the SVC innominate vein, bilateral jugular vein and  She was started on anticoagulation therapy. She received a total of 6 cycles of Taxotere/Carbo/Herceptin, completed in April 2005. Her subsequent  history is as detailed below.  INTERVAL HISTORY: [name] returns today accompanied by her husband, [name], for follow up of her metastatic breast cancer.  Since the last visit here she was evaluated by [name] and he felt the most recent echo have not been read accurately, and that there has been in fact been no change in her ejection fraction or S' motion.  REVIEW OF SYSTEMS: [name] is doing remarkably well overall. She loves to read in the morning, sometimes the early morning, and then she gets her grandchildren (who lives next door) off to school. She does some housework, but does not usually leave the house on most days. She then helps with the grandchildren after they return from school. She denies any unusual headaches, visual changes, dizziness, gait imbalance, falls, nausea or vomiting. She denies chest pain or pressure, palpitations, or worsening shortness of breath. A detailed review of systems was otherwise stable.  PAST MEDICAL HISTORY: Past Medical History  Diagnosis Date  . Breast cancer     mets to liver and lung  . Hypertension   . SVC syndrome   . History of chemotherapy Feb. 2006    taxotere/herceptin/carboplatin  . Radiation 07/31/2006    left upper chest  . Radiation 06/17/2006-06/27/2006    6480 cGy bilat. chest wall    PAST SURGICAL HISTORY: Past Surgical History  Procedure Date  . Tubal ligation 1986  . Cholecystectomy 1989    FAMILY HISTORY No family history on file. She had  three brothers, one died of gunshot wound, one of complications of diabetes mellitus and one of myocardial infarction.  She has no sisters.  Mother died of complications of brain metastasis in 24.  Father has had a myocardial infarction in 1999.  No history of breast or ovarian cancer in the family.     GYNECOLOGIC HISTORY:  :   Menarche at age 72.  Gravida 3, para 3.  First live birth at age 62.  No history of breast feeding. No history of hormonal replacement therapy.   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 Her husband used to work as a Psychologist, educational, but is now retired She has three children, [name] who livesin [place] and works as a Copy, [name] who lives in [place] and works as a truck driverand [place] who lives in [place] and also works as a Copy. The patient has 12 grandchildren and 3 great-grandchildren. She attends a local [employer]   ADVANCED DIRECTIVES:  Not in place  HEALTH MAINTENANCE: History  Substance Use Topics  . Smoking status: Never Smoker   . Smokeless tobacco: Never Used  . Alcohol Use: Yes     Comment: occasional     Colonoscopy: Never  PAP:  1987  Bone density:  Never  Lipid panel:  Allergies  Allergen Reactions  . Adhesive (Tape) Other (See Comments)    Tears skin   . Penicillins Hives    Current Outpatient Prescriptions  Medication Sig Dispense Refill  . albuterol-ipratropium (COMBIVENT) 18-103 MCG/ACT inhaler Inhale 2 puffs into the lungs every 6 (six) hours as needed for wheezing.  1 Inhaler  0  . ALPRAZolam (XANAX) 1 MG tablet Take 1 tablet (1 mg total) by mouth 3 (three) times daily as needed for sleep.  90 tablet  1  . AmLODIPine Besylate (NORVASC PO) Take 10 mg by mouth every morning.       . cyclobenzaprine (FLEXERIL) 10 MG tablet TAKE 1 TABLET BY MOUTH 3 TIMES A DAY AS NEEDED FOR MUSCLE SPASM  90 tablet  0  . Eszopiclone 3 MG TABS Take 3 mg by mouth as needed. Take immediately before bedtime      . furosemide (LASIX) 80 MG tablet Take 40 mg by mouth as needed.       . gabapentin (NEURONTIN) 300 MG capsule       . losartan (COZAAR) 100 MG tablet Take 100 mg by mouth every morning.       Marland Kitchen morphine (MS CONTIN) 60 MG 12 hr tablet Take 1 tablet (60 mg total) by mouth 2 (two) times daily.  60 tablet  0  . morphine (MSIR) 15 MG tablet Take 1 tablet (15 mg total) by mouth every 4 (four) hours as needed.  30 tablet  0  . Potassium  Chloride (K-TABS PO) Take 40 mEq by mouth as needed.       . prochlorperazine (COMPAZINE) 10 MG tablet Take 10 mg by mouth every 6 (six) hours as needed. nausea      . spironolactone (ALDACTONE) 25 MG tablet Take 0.5 tablets (12.5 mg total) by mouth daily.  15 tablet  3  . warfarin (COUMADIN) 5 MG tablet TAKE 1 TABLET BY MOUTH EVERY DAY AS DIRECTED  30 tablet  2  . zolpidem (AMBIEN) 5 MG tablet Take 1 tablet (5 mg total) by mouth at bedtime as needed for sleep.  30 tablet  2    OBJECTIVE:  Middle aged African American female in no acute distress. Filed Vitals:   09/26/12 0950  BP: 149/88  Pulse: 92  Temp: 98.7 F (37.1 C)  Resp: 20     Body mass index is 51.48 kg/(m^2).    ECOG FS: 1 Filed Weights   09/26/12 0950  Weight: 131.815 kg (290 lb 9.6 oz)    Sclerae unicteric Oropharynx clear No cervical or supraclavicular adenopathy Lungs no rales or rhonchi Heart regular rate and rhythm Abdomen soft, nontender, obese MSK no focal spinal tenderness, no peripheral edema Neuro: nonfocal, well oriented, positive affect PICC intact in upper right chest wall Breasts:   Status post bilateral mastectomies, no evidence of local recurrence.  Both axillae are benign.  LAB RESULTS: Lab Results  Component Value Date   WBC 13.9* 09/26/2012   NEUTROABS 9.6* 09/26/2012   HGB 11.1* 09/26/2012   HCT 32.8* 09/26/2012   MCV 79.6 09/26/2012   PLT 213 09/26/2012      Chemistry      Component Value Date/Time   NA 142 09/05/2012 0825   NA 139 04/11/2012 0837   K 3.4* 09/05/2012 0825   K 3.8 04/11/2012 0837   CL 105 09/05/2012 0825   CL 101 04/11/2012 0837   CO2 25 09/05/2012 0825   CO2 27 04/11/2012 0837   BUN 16.0 09/05/2012 0825   BUN 25* 04/11/2012 0837   CREATININE 0.9 09/05/2012 0825   CREATININE 1.12* 04/11/2012 0837      Component Value Date/Time   CALCIUM 9.3 09/05/2012 0825   CALCIUM 8.7 04/11/2012 0837   ALKPHOS 105 09/05/2012 0825   ALKPHOS 87 04/11/2012 0837   AST 12 09/05/2012 0825   AST 19  04/11/2012 0837   ALT 10 09/05/2012 0825   ALT 14 04/11/2012 0837   BILITOT 0.94 09/05/2012 0825   BILITOT 0.8 04/11/2012 0837       Lab Results  Component Value Date   LABCA2 22 08/15/2012   STUDIES: No results found.  ASSESSMENT: 62 y.o.  [place], [place], woman  (1)  with a history of inflammatory right breast cancer metastatic at presentation September 2004 with involvement of liver and bone, HER-2 positive, estrogen and progesterone receptor negative  (2) treated with carboplatin, docetaxel and Herceptin x6 completed April 2005  (3) trastuzumab continued indefinitely; has also received lapatinib and capecitabine for variable intervals in 2007-2008.  (4) status post bilateral mastectomies with bilateral axillary lymph node dissection 12/07/2004, showing  (a) on the right, a mypT1c ypN1 invasive ductal carcinoma, grade 3, estrogen and progesterone receptor negative, HER-2 positive, with an MIB-1 of 31%  (b) on the left, ypT2 ypN1 invasive ductal carcinoma, grade 2, estrogen and progesterone receptor negative, HER-2 positive, with an MIB-1 of 35%.  (5)  Status post radiation June through July of 2006, to the right chest wall, left chest wall, bilateral supraclavicular fossae, and bilateral axillary boosts; with additional radiation to the right and left chest walls and the central chest wall completed November of 2007  (6) status post ixempra x9 completed August of 2009.  (7) history of superior vena caval syndrome, on life long anticoagulation   (8)  History of chemotherapy-induced neuropathy. On Neurontin 600 mg p.o. T.i.d., MSContin, and MSIR.   PLAN: [name]'s echocardiograms have been reviewed by Dr. Augustina Mood and he feels there has not been any drop in her ejection fraction and that it is safe for her to continue on Herceptin. Accordingly that is what  we are doing.  I discussed bisphosphonates at length with Ms. Manson Passey and I think she would benefit from zoledronic  acid. However she has not seen a dentist in many years and her dental hygiene is not good. I asked her to establish yourself with a dentist (her husband also has not had dental care for some years) and give her dentist a copy of the notes I wrote for her today regarding bisphosphonates. If he feels no dental extractions are likely in the next year or 2 then we can proceed with the zoledronic acid at the next visit.  I am referring her to our Coumadin clinic for further management of her anticoagulation. Her PET scan September 2013 showed no extra osseous spread of disease, and we will consider repeating some imaging September of 2014 versus continuing with clinical assessment and adding more extensive imaging studies to evaluate new symptoms. Devony will continue to see [place] on a every 3 month basis. She knows to call for any problems that may develop before the next visit.    MAGRINAT,GUSTAV C

## 2012-09-29 ENCOUNTER — Telehealth: Payer: Self-pay | Admitting: Oncology

## 2012-09-29 NOTE — Telephone Encounter (Signed)
Added appts for march thry June. S/w pt re each appts d/t for 2/21 thru 4/25. Pt will get complete schedule when she comes in 2/21.

## 2012-10-10 ENCOUNTER — Other Ambulatory Visit: Payer: Self-pay | Admitting: Oncology

## 2012-10-17 ENCOUNTER — Ambulatory Visit (HOSPITAL_BASED_OUTPATIENT_CLINIC_OR_DEPARTMENT_OTHER): Payer: Medicare Other

## 2012-10-17 ENCOUNTER — Other Ambulatory Visit: Payer: Self-pay | Admitting: Oncology

## 2012-10-17 ENCOUNTER — Ambulatory Visit: Payer: Medicare Other | Admitting: Oncology

## 2012-10-17 ENCOUNTER — Other Ambulatory Visit: Payer: Medicare Other | Admitting: Lab

## 2012-10-17 ENCOUNTER — Other Ambulatory Visit: Payer: Self-pay | Admitting: *Deleted

## 2012-10-17 ENCOUNTER — Other Ambulatory Visit (HOSPITAL_BASED_OUTPATIENT_CLINIC_OR_DEPARTMENT_OTHER): Payer: Medicare Other | Admitting: Lab

## 2012-10-17 LAB — PROTIME-INR
INR: 1.5 — ABNORMAL LOW (ref 2.00–3.50)
Protime: 18 Seconds — ABNORMAL HIGH (ref 10.6–13.4)

## 2012-10-17 LAB — COMPREHENSIVE METABOLIC PANEL (CC13)
ALT: 11 U/L (ref 0–55)
Alkaline Phosphatase: 81 U/L (ref 40–150)
CO2: 26 mEq/L (ref 22–29)
Creatinine: 0.8 mg/dL (ref 0.6–1.1)
Glucose: 97 mg/dl (ref 70–99)
Sodium: 142 mEq/L (ref 136–145)
Total Bilirubin: 0.86 mg/dL (ref 0.20–1.20)
Total Protein: 8 g/dL (ref 6.4–8.3)

## 2012-10-17 LAB — CBC WITH DIFFERENTIAL/PLATELET
EOS%: 1.7 % (ref 0.0–7.0)
Eosinophils Absolute: 0.2 10*3/uL (ref 0.0–0.5)
LYMPH%: 40.6 % (ref 14.0–49.7)
MCH: 27.4 pg (ref 25.1–34.0)
MCV: 78.5 fL — ABNORMAL LOW (ref 79.5–101.0)
MONO%: 7.2 % (ref 0.0–14.0)
Platelets: 236 10*3/uL (ref 145–400)
RBC: 4.41 10*6/uL (ref 3.70–5.45)
RDW: 15.6 % — ABNORMAL HIGH (ref 11.2–14.5)
nRBC: 0 % (ref 0–0)

## 2012-10-17 MED ORDER — DIPHENHYDRAMINE HCL 25 MG PO CAPS
25.0000 mg | ORAL_CAPSULE | Freq: Once | ORAL | Status: AC
  Administered 2012-10-17: 25 mg via ORAL

## 2012-10-17 MED ORDER — DIPHENHYDRAMINE HCL 25 MG PO TABS
25.0000 mg | ORAL_TABLET | Freq: Once | ORAL | Status: AC
  Administered 2012-10-17: 25 mg via ORAL
  Filled 2012-10-17: qty 1

## 2012-10-17 MED ORDER — HEPARIN SOD (PORK) LOCK FLUSH 100 UNIT/ML IV SOLN
500.0000 [IU] | Freq: Once | INTRAVENOUS | Status: AC | PRN
  Administered 2012-10-17: 500 [IU]
  Filled 2012-10-17: qty 5

## 2012-10-17 MED ORDER — SODIUM CHLORIDE 0.9 % IV SOLN
Freq: Once | INTRAVENOUS | Status: AC
  Administered 2012-10-17: 10:00:00 via INTRAVENOUS

## 2012-10-17 MED ORDER — SODIUM CHLORIDE 0.9 % IJ SOLN
10.0000 mL | INTRAMUSCULAR | Status: DC | PRN
  Administered 2012-10-17: 10 mL
  Filled 2012-10-17: qty 10

## 2012-10-17 MED ORDER — TRASTUZUMAB CHEMO INJECTION 440 MG
6.0000 mg/kg | Freq: Once | INTRAVENOUS | Status: AC
  Administered 2012-10-17: 756 mg via INTRAVENOUS
  Filled 2012-10-17: qty 36

## 2012-10-17 MED ORDER — LORAZEPAM 2 MG/ML IJ SOLN
1.0000 mg | Freq: Once | INTRAMUSCULAR | Status: AC | PRN
  Administered 2012-10-17: 1 mg via INTRAVENOUS

## 2012-10-17 NOTE — Patient Instructions (Signed)
[facility] Discharge Instructions for Patients Receiving Chemotherapy  Today you received the following chemotherapy agents Herceptin.      BELOW ARE SYMPTOMS THAT SHOULD BE REPORTED IMMEDIATELY:  *FEVER GREATER THAN 100.5 F  *CHILLS WITH OR WITHOUT FEVER  NAUSEA AND VOMITING THAT IS NOT CONTROLLED WITH YOUR NAUSEA MEDICATION  *UNUSUAL SHORTNESS OF BREATH  *UNUSUAL BRUISING OR BLEEDING  TENDERNESS IN MOUTH AND THROAT WITH OR WITHOUT PRESENCE OF ULCERS  *URINARY PROBLEMS  *BOWEL PROBLEMS  UNUSUAL RASH Items with * indicate a potential emergency and should be followed up as soon as possible.  Feel free to call the clinic you have any questions or concerns. The clinic phone number is [phone].    

## 2012-11-07 ENCOUNTER — Other Ambulatory Visit (HOSPITAL_BASED_OUTPATIENT_CLINIC_OR_DEPARTMENT_OTHER): Payer: Medicare Other | Admitting: Lab

## 2012-11-07 ENCOUNTER — Ambulatory Visit (HOSPITAL_BASED_OUTPATIENT_CLINIC_OR_DEPARTMENT_OTHER): Payer: Medicare Other

## 2012-11-07 VITALS — BP 160/101 | HR 72 | Temp 98.6°F | Resp 20 | Wt 290.8 lb

## 2012-11-07 DIAGNOSIS — C7951 Secondary malignant neoplasm of bone: Secondary | ICD-10-CM

## 2012-11-07 DIAGNOSIS — C50919 Malignant neoplasm of unspecified site of unspecified female breast: Secondary | ICD-10-CM

## 2012-11-07 DIAGNOSIS — I871 Compression of vein: Secondary | ICD-10-CM

## 2012-11-07 LAB — COMPREHENSIVE METABOLIC PANEL (CC13)
ALT: 15 U/L (ref 0–55)
BUN: 23.4 mg/dL (ref 7.0–26.0)
CO2: 27 mEq/L (ref 22–29)
Creatinine: 1.1 mg/dL (ref 0.6–1.1)
Total Bilirubin: 0.68 mg/dL (ref 0.20–1.20)

## 2012-11-07 LAB — CBC WITH DIFFERENTIAL/PLATELET
BASO%: 0.7 % (ref 0.0–2.0)
EOS%: 2.5 % (ref 0.0–7.0)
LYMPH%: 40.5 % (ref 14.0–49.7)
MCHC: 34.4 g/dL (ref 31.5–36.0)
MCV: 80 fL (ref 79.5–101.0)
MONO%: 8.3 % (ref 0.0–14.0)
Platelets: 233 10*3/uL (ref 145–400)
RBC: 4.33 10*6/uL (ref 3.70–5.45)

## 2012-11-07 LAB — PROTIME-INR: Protime: 16.8 Seconds — ABNORMAL HIGH (ref 10.6–13.4)

## 2012-11-07 MED ORDER — LORAZEPAM 2 MG/ML IJ SOLN
1.0000 mg | Freq: Once | INTRAMUSCULAR | Status: AC | PRN
  Administered 2012-11-07: 1 mg via INTRAVENOUS

## 2012-11-07 MED ORDER — DIPHENHYDRAMINE HCL 25 MG PO CAPS
50.0000 mg | ORAL_CAPSULE | Freq: Once | ORAL | Status: AC
  Administered 2012-11-07: 50 mg via ORAL

## 2012-11-07 MED ORDER — HEPARIN SOD (PORK) LOCK FLUSH 100 UNIT/ML IV SOLN
500.0000 [IU] | Freq: Once | INTRAVENOUS | Status: DC | PRN
  Filled 2012-11-07: qty 5

## 2012-11-07 MED ORDER — SODIUM CHLORIDE 0.9 % IV SOLN
Freq: Once | INTRAVENOUS | Status: AC
  Administered 2012-11-07: 10:00:00 via INTRAVENOUS

## 2012-11-07 MED ORDER — SODIUM CHLORIDE 0.9 % IJ SOLN
10.0000 mL | INTRAMUSCULAR | Status: DC | PRN
  Filled 2012-11-07: qty 10

## 2012-11-07 MED ORDER — TRASTUZUMAB CHEMO INJECTION 440 MG
6.0000 mg/kg | Freq: Once | INTRAVENOUS | Status: AC
  Administered 2012-11-07: 756 mg via INTRAVENOUS
  Filled 2012-11-07: qty 36

## 2012-11-07 MED ORDER — ACETAMINOPHEN 325 MG PO TABS
650.0000 mg | ORAL_TABLET | Freq: Once | ORAL | Status: AC
  Administered 2012-11-07: 650 mg via ORAL

## 2012-11-07 NOTE — Progress Notes (Signed)
Called collaborative nurse to alert of low PT/INR today. She will discuss with on-call physician and call patient at home with Coumadin dosing orders. Currently on 5 mg daily. Patient insists she takes Benadryl 50 mg po instead of 25 mg with each Herceptin and also takes Tylenol 650 mg po in past

## 2012-11-07 NOTE — Patient Instructions (Signed)
[facility] Discharge Instructions for Patients Receiving Chemotherapy  Today you received the following chemotherapy agent: Herceptin   To help prevent nausea and vomiting after your treatment, we encourage you to take your nausea medication : Compazine 10 mg every 6 hours as needed for nausea    If you develop nausea and vomiting that is not controlled by your nausea medication, call the clinic. If it is after clinic hours your family physician or the after hours number for the clinic or go to the Emergency Department.   BELOW ARE SYMPTOMS THAT SHOULD BE REPORTED IMMEDIATELY:  *FEVER GREATER THAN 100.5 F  *CHILLS WITH OR WITHOUT FEVER  NAUSEA AND VOMITING THAT IS NOT CONTROLLED WITH YOUR NAUSEA MEDICATION  *UNUSUAL SHORTNESS OF BREATH  *UNUSUAL BRUISING OR BLEEDING  TENDERNESS IN MOUTH AND THROAT WITH OR WITHOUT PRESENCE OF ULCERS  *URINARY PROBLEMS  *BOWEL PROBLEMS  UNUSUAL RASH Items with * indicate a potential emergency and should be followed up as soon as possible.   Feel free to call the clinic you have any questions or concerns. The clinic phone number is [phone].   I have been informed and understand all the instructions given to me. I know to contact the clinic, my physician, or go to the Emergency Department if any problems should occur. I do not have any questions at this time, but understand that I may call the clinic during office hours   should I have any questions or need assistance in obtaining follow up care.    __________________________________________  _____________  __________ Signature of Patient or Authorized Representative            Date                   Time    __________________________________________ Nurse's Signature

## 2012-11-10 ENCOUNTER — Other Ambulatory Visit: Payer: Self-pay | Admitting: Pharmacist

## 2012-11-10 ENCOUNTER — Other Ambulatory Visit: Payer: Self-pay | Admitting: *Deleted

## 2012-11-10 DIAGNOSIS — I82409 Acute embolism and thrombosis of unspecified deep veins of unspecified lower extremity: Secondary | ICD-10-CM | POA: Insufficient documentation

## 2012-11-10 MED ORDER — ZOLPIDEM TARTRATE 5 MG PO TABS: 5.0000 mg | ORAL_TABLET | Freq: Every evening | ORAL | Status: DC | PRN

## 2012-11-10 MED ORDER — CYCLOBENZAPRINE HCL 10 MG PO TABS: ORAL_TABLET | ORAL | Status: DC

## 2012-11-11 ENCOUNTER — Other Ambulatory Visit: Payer: Self-pay | Admitting: Oncology

## 2012-11-13 ENCOUNTER — Other Ambulatory Visit: Payer: Self-pay | Admitting: Oncology

## 2012-11-28 ENCOUNTER — Ambulatory Visit (HOSPITAL_BASED_OUTPATIENT_CLINIC_OR_DEPARTMENT_OTHER): Payer: Medicare Other

## 2012-11-28 ENCOUNTER — Other Ambulatory Visit (HOSPITAL_BASED_OUTPATIENT_CLINIC_OR_DEPARTMENT_OTHER): Payer: Medicare Other | Admitting: Lab

## 2012-11-28 ENCOUNTER — Ambulatory Visit (HOSPITAL_BASED_OUTPATIENT_CLINIC_OR_DEPARTMENT_OTHER): Payer: Medicare Other | Admitting: Pharmacist

## 2012-11-28 VITALS — BP 131/73 | HR 77 | Temp 97.5°F | Resp 16

## 2012-11-28 DIAGNOSIS — C50919 Malignant neoplasm of unspecified site of unspecified female breast: Secondary | ICD-10-CM

## 2012-11-28 DIAGNOSIS — C50219 Malignant neoplasm of upper-inner quadrant of unspecified female breast: Secondary | ICD-10-CM

## 2012-11-28 DIAGNOSIS — Z5112 Encounter for antineoplastic immunotherapy: Secondary | ICD-10-CM

## 2012-11-28 DIAGNOSIS — C50119 Malignant neoplasm of central portion of unspecified female breast: Secondary | ICD-10-CM

## 2012-11-28 DIAGNOSIS — C7951 Secondary malignant neoplasm of bone: Secondary | ICD-10-CM

## 2012-11-28 DIAGNOSIS — I871 Compression of vein: Secondary | ICD-10-CM

## 2012-11-28 LAB — PROTIME-INR
INR: 2 (ref 2.00–3.50)
Protime: 24 Seconds — ABNORMAL HIGH (ref 10.6–13.4)

## 2012-11-28 LAB — COMPREHENSIVE METABOLIC PANEL (CC13)
AST: 11 U/L (ref 5–34)
Alkaline Phosphatase: 91 U/L (ref 40–150)
BUN: 17.5 mg/dL (ref 7.0–26.0)
Calcium: 10 mg/dL (ref 8.4–10.4)
Chloride: 103 mEq/L (ref 98–107)
Creatinine: 0.9 mg/dL (ref 0.6–1.1)

## 2012-11-28 LAB — CBC WITH DIFFERENTIAL/PLATELET
BASO%: 0.2 % (ref 0.0–2.0)
EOS%: 3.7 % (ref 0.0–7.0)
MCH: 27.4 pg (ref 25.1–34.0)
MCHC: 34.9 g/dL (ref 31.5–36.0)
RBC: 4.52 10*6/uL (ref 3.70–5.45)
RDW: 15.3 % — ABNORMAL HIGH (ref 11.2–14.5)
lymph#: 3.9 10*3/uL — ABNORMAL HIGH (ref 0.9–3.3)
nRBC: 0 % (ref 0–0)

## 2012-11-28 MED ORDER — ACETAMINOPHEN 325 MG PO TABS
650.0000 mg | ORAL_TABLET | Freq: Once | ORAL | Status: AC
  Administered 2012-11-28: 650 mg via ORAL

## 2012-11-28 MED ORDER — SODIUM CHLORIDE 0.9 % IJ SOLN
10.0000 mL | INTRAMUSCULAR | Status: DC | PRN
  Administered 2012-11-28: 10 mL
  Filled 2012-11-28: qty 10

## 2012-11-28 MED ORDER — HEPARIN SOD (PORK) LOCK FLUSH 100 UNIT/ML IV SOLN
250.0000 [IU] | Freq: Once | INTRAVENOUS | Status: AC | PRN
  Administered 2012-11-28: 250 [IU]
  Filled 2012-11-28: qty 5

## 2012-11-28 MED ORDER — SODIUM CHLORIDE 0.9 % IV SOLN
Freq: Once | INTRAVENOUS | Status: AC
  Administered 2012-11-28: 10:00:00 via INTRAVENOUS

## 2012-11-28 MED ORDER — TRASTUZUMAB CHEMO INJECTION 440 MG
6.0000 mg/kg | Freq: Once | INTRAVENOUS | Status: AC
  Administered 2012-11-28: 756 mg via INTRAVENOUS
  Filled 2012-11-28: qty 36

## 2012-11-28 MED ORDER — DIPHENHYDRAMINE HCL 25 MG PO CAPS
50.0000 mg | ORAL_CAPSULE | Freq: Once | ORAL | Status: AC
  Administered 2012-11-28: 50 mg via ORAL

## 2012-11-28 MED ORDER — LORAZEPAM 2 MG/ML IJ SOLN
1.0000 mg | Freq: Once | INTRAMUSCULAR | Status: AC | PRN
  Administered 2012-11-28: 1 mg via INTRAVENOUS

## 2012-11-28 NOTE — Patient Instructions (Signed)
Continue taking coumadin 5mg  alternating with 7.5mg  daily.  Recheck INR in 3 weeks with next scheduled lab/provider/treatment appointments on 12/19/12; lab at 8:30am, provider at 9am, treatment at 10am and coumadin clinic at 10:15am.

## 2012-11-28 NOTE — Progress Notes (Signed)
INR within goal today. No problems to report regarding anticoagulation. Pt missed one dose of coumadin in the last week. She was at a late evening meeting and was not home to take the medication. Pt enjoys eating collard greens and spinach. Continue coumadin 5mg  alternating with 7.5mg  daily.  Recheck INR in 3 weeks with next scheduled lab/provider/treatment appointments on 12/19/12; lab at 8:30am, provider at 9am, treatment at 10am and coumadin clinic at 10:15am.

## 2012-11-28 NOTE — Patient Instructions (Addendum)
[facility] Discharge Instructions for Patients Receiving Chemotherapy  Today you received the following chemotherapy agents herceptin  If you develop nausea and vomiting that is not controlled by your nausea medication, call the clinic. If it is after clinic hours your family physician or the after hours number for the clinic or go to the Emergency Department.   BELOW ARE SYMPTOMS THAT SHOULD BE REPORTED IMMEDIATELY:  *FEVER GREATER THAN 100.5 F  *CHILLS WITH OR WITHOUT FEVER  NAUSEA AND VOMITING THAT IS NOT CONTROLLED WITH YOUR NAUSEA MEDICATION  *UNUSUAL SHORTNESS OF BREATH  *UNUSUAL BRUISING OR BLEEDING  TENDERNESS IN MOUTH AND THROAT WITH OR WITHOUT PRESENCE OF ULCERS  *URINARY PROBLEMS  *BOWEL PROBLEMS  UNUSUAL RASH Items with * indicate a potential emergency and should be followed up as soon as possible.   Feel free to call the clinic you have any questions or concerns. The clinic phone number is [phone].   I have been informed and understand all the instructions given to me. I know to contact the clinic, my physician, or go to the Emergency Department if any problems should occur. I do not have any questions at this time, but understand that I may call the clinic during office hours   should I have any questions or need assistance in obtaining follow up care.    __________________________________________  _____________  __________ Signature of Patient or Authorized Representative            Date                   Time    __________________________________________ Nurse's Signature

## 2012-12-11 ENCOUNTER — Telehealth (HOSPITAL_COMMUNITY): Payer: Self-pay | Admitting: Cardiology

## 2012-12-11 DIAGNOSIS — C50919 Malignant neoplasm of unspecified site of unspecified female breast: Secondary | ICD-10-CM

## 2012-12-11 NOTE — Telephone Encounter (Signed)
ORDER PLACED FOR UPCOMING ECHO 

## 2012-12-18 ENCOUNTER — Ambulatory Visit (HOSPITAL_COMMUNITY)
Admission: RE | Admit: 2012-12-18 | Discharge: 2012-12-18 | Disposition: A | Discharge status: Home / Self Care | Payer: Medicare Other | Source: Ambulatory Visit | Attending: Internal Medicine | Admitting: Internal Medicine

## 2012-12-18 DIAGNOSIS — Z79899 Other long term (current) drug therapy: Secondary | ICD-10-CM | POA: Insufficient documentation

## 2012-12-18 DIAGNOSIS — I517 Cardiomegaly: Secondary | ICD-10-CM

## 2012-12-18 DIAGNOSIS — C50919 Malignant neoplasm of unspecified site of unspecified female breast: Secondary | ICD-10-CM | POA: Insufficient documentation

## 2012-12-19 ENCOUNTER — Telehealth: Payer: Self-pay | Admitting: *Deleted

## 2012-12-19 ENCOUNTER — Other Ambulatory Visit (HOSPITAL_BASED_OUTPATIENT_CLINIC_OR_DEPARTMENT_OTHER): Payer: Medicare Other | Admitting: Lab

## 2012-12-19 ENCOUNTER — Ambulatory Visit (HOSPITAL_BASED_OUTPATIENT_CLINIC_OR_DEPARTMENT_OTHER): Payer: Medicare Other

## 2012-12-19 ENCOUNTER — Ambulatory Visit: Payer: Medicare Other | Admitting: Pharmacist

## 2012-12-19 ENCOUNTER — Ambulatory Visit (HOSPITAL_BASED_OUTPATIENT_CLINIC_OR_DEPARTMENT_OTHER): Payer: Medicare Other | Admitting: Family

## 2012-12-19 ENCOUNTER — Encounter: Payer: Self-pay | Admitting: Family

## 2012-12-19 VITALS — BP 151/90 | HR 78 | Temp 98.1°F | Resp 20 | Ht 63.0 in | Wt 288.1 lb

## 2012-12-19 DIAGNOSIS — C50919 Malignant neoplasm of unspecified site of unspecified female breast: Secondary | ICD-10-CM

## 2012-12-19 DIAGNOSIS — I871 Compression of vein: Secondary | ICD-10-CM

## 2012-12-19 DIAGNOSIS — C50119 Malignant neoplasm of central portion of unspecified female breast: Secondary | ICD-10-CM

## 2012-12-19 DIAGNOSIS — Z5112 Encounter for antineoplastic immunotherapy: Secondary | ICD-10-CM

## 2012-12-19 DIAGNOSIS — C787 Secondary malignant neoplasm of liver and intrahepatic bile duct: Secondary | ICD-10-CM

## 2012-12-19 DIAGNOSIS — I82409 Acute embolism and thrombosis of unspecified deep veins of unspecified lower extremity: Secondary | ICD-10-CM

## 2012-12-19 DIAGNOSIS — C50219 Malignant neoplasm of upper-inner quadrant of unspecified female breast: Secondary | ICD-10-CM

## 2012-12-19 LAB — CBC WITH DIFFERENTIAL/PLATELET
BASO%: 0.5 % (ref 0.0–2.0)
LYMPH%: 49.1 % (ref 14.0–49.7)
MCHC: 34.8 g/dL (ref 31.5–36.0)
MONO#: 0.7 10*3/uL (ref 0.1–0.9)
MONO%: 8.8 % (ref 0.0–14.0)
Platelets: 257 10*3/uL (ref 145–400)
RBC: 4.63 10*6/uL (ref 3.70–5.45)
WBC: 8.1 10*3/uL (ref 3.9–10.3)
nRBC: 0 % (ref 0–0)

## 2012-12-19 LAB — COMPREHENSIVE METABOLIC PANEL (CC13)
BUN: 12.9 mg/dL (ref 7.0–26.0)
CO2: 25 mEq/L (ref 22–29)
Creatinine: 0.9 mg/dL (ref 0.6–1.1)
Glucose: 109 mg/dl — ABNORMAL HIGH (ref 70–99)
Total Bilirubin: 0.42 mg/dL (ref 0.20–1.20)

## 2012-12-19 LAB — PROTIME-INR

## 2012-12-19 MED ORDER — MORPHINE SULFATE 15 MG PO TABS: 15.0000 mg | ORAL_TABLET | ORAL | Status: DC | PRN

## 2012-12-19 MED ORDER — DIPHENHYDRAMINE HCL 25 MG PO CAPS
50.0000 mg | ORAL_CAPSULE | Freq: Once | ORAL | Status: AC
Start: 2012-12-19 — End: 2012-12-19
  Administered 2012-12-19: 50 mg via ORAL

## 2012-12-19 MED ORDER — HEPARIN SOD (PORK) LOCK FLUSH 100 UNIT/ML IV SOLN
250.0000 [IU] | Freq: Once | INTRAVENOUS | Status: AC | PRN
  Administered 2012-12-19: 250 [IU]
  Filled 2012-12-19: qty 5

## 2012-12-19 MED ORDER — TRASTUZUMAB CHEMO INJECTION 440 MG
6.0000 mg/kg | Freq: Once | INTRAVENOUS | Status: AC
  Administered 2012-12-19: 756 mg via INTRAVENOUS
  Filled 2012-12-19: qty 36

## 2012-12-19 MED ORDER — SODIUM CHLORIDE 0.9 % IJ SOLN
10.0000 mL | INTRAMUSCULAR | Status: DC | PRN
  Administered 2012-12-19: 10 mL
  Filled 2012-12-19: qty 10

## 2012-12-19 MED ORDER — MORPHINE SULFATE ER 60 MG PO TBCR: 60.0000 mg | EXTENDED_RELEASE_TABLET | Freq: Two times a day (BID) | ORAL | Status: DC

## 2012-12-19 MED ORDER — ACETAMINOPHEN 325 MG PO TABS
650.0000 mg | ORAL_TABLET | Freq: Once | ORAL | Status: AC
  Administered 2012-12-19: 650 mg via ORAL

## 2012-12-19 MED ORDER — SODIUM CHLORIDE 0.9 % IV SOLN
Freq: Once | INTRAVENOUS | Status: AC
  Administered 2012-12-19: 11:00:00 via INTRAVENOUS

## 2012-12-19 MED ORDER — ALPRAZOLAM 1 MG PO TABS: 1.0000 mg | ORAL_TABLET | Freq: Three times a day (TID) | ORAL | Status: DC | PRN

## 2012-12-19 MED ORDER — LORAZEPAM 2 MG/ML IJ SOLN
1.0000 mg | Freq: Once | INTRAMUSCULAR | Status: AC | PRN
  Administered 2012-12-19: 1 mg via INTRAVENOUS

## 2012-12-19 NOTE — Patient Instructions (Addendum)
[facility] Discharge Instructions for Patients Receiving Chemotherapy  Today you received the following chemotherapy agents Herceptin.  To help prevent nausea and vomiting after your treatment, we encourage you to take your nausea medication as prescribed.   If you develop nausea and vomiting that is not controlled by your nausea medication, call the clinic. If it is after clinic hours your family physician or the after hours number for the clinic or go to the Emergency Department.   BELOW ARE SYMPTOMS THAT SHOULD BE REPORTED IMMEDIATELY:  *FEVER GREATER THAN 100.5 F  *CHILLS WITH OR WITHOUT FEVER  NAUSEA AND VOMITING THAT IS NOT CONTROLLED WITH YOUR NAUSEA MEDICATION  *UNUSUAL SHORTNESS OF BREATH  *UNUSUAL BRUISING OR BLEEDING  TENDERNESS IN MOUTH AND THROAT WITH OR WITHOUT PRESENCE OF ULCERS  *URINARY PROBLEMS  *BOWEL PROBLEMS  UNUSUAL RASH Items with * indicate a potential emergency and should be followed up as soon as possible.  Feel free to call the clinic you have any questions or concerns. The clinic phone number is [phone].   I have been informed and understand all the instructions given to me. I know to contact the clinic, my physician, or go to the Emergency Department if any problems should occur. I do not have any questions at this time, but understand that I may call the clinic during office hours   should I have any questions or need assistance in obtaining follow up care.    __________________________________________  _____________  __________ Signature of Patient or Authorized Representative            Date                   Time    __________________________________________ Nurse's Signature    

## 2012-12-19 NOTE — Progress Notes (Signed)
[facility]  [phone] [phone]  OFFICE PROGRESS NOTE  ID: [name]   DOB: [birth year]  MR#: [id number]  ZHY#:[id number]  PCP: Philemon Kingdom, MD GYN:  SU:  OTHER MD: Elmon Kirschner, Antony Blackbird   HISTORY OF PRESENT ILLNESS:   From Dr. Renelda Loma note 06/04/2003: "This woman has been in good health all of her life.  She noted a swelling and discomfort in her right breast in June of this year.  She was seen in the Emergency Room in [place] and was treated for mastitis.  She was treated for a number of months with mastitis and the swelling did not get better. She was given hydrocodone and Cipro.  Finally, the swelling did get better and ultimately the nipple became retracted and she noticed some dimpling in her skin. She had a mammogram in July of this year in [place] with subsequent mammogram on May 27, 2003, by Dr. Yolanda Bonine.  Mammogram done on September 30 showed marked increased density in the left breast.  Biopsy was performed the same day.  It was noted at the 12 o'clock position, deep in the breast was a focal hypoechoic mass, at least 3.5 cm in diameter.  Biopsy did in fact show invasive in situ mammary carcinoma. This was felt to be both at least intermediate, high grade.  No definite lymphovascular invasion was identified. ER and PR, Her2 testing is pending.  Jadea continues to have pain in her breast.  She continues to take hydrocodone a number of times a day.  She has been seen by Dr. Earlene Plater, who felt that neoadjuvant chemotherapy would be required.    Initial staging studies showed evidence of liver and lung mets.   Patient also has evidence of bone lesions. Patient started neoadjuvant chemotherapy, Taxotere/Carbo/Herceptin in October 2004.   Patient had a CT scan in December 2004 which demonstrated extensive clot in the SVC innominate vein, bilateral jugular vein and  She was started on anticoagulation therapy. She  received a total of 6 cycles of Taxotere/Carbo/Herceptin, completed in April 2005."  Her subsequent history is as detailed below.  INTERVAL HISTORY: [name] returns today accompanied by her husband, [name], for follow up of her metastatic breast cancer.  Since the last visit here she was evaluated by Dr. Gala Romney and completed an echocardiogram. Her EF is essentially stable and she will proceed on to have a Herceptin infusion today.    REVIEW OF SYSTEMS: A 10 point review of systems was completed and is negative except for continuing joint aches in her left hip, right shoulder, and bilateral knees.  The patient also has complaints of intermittent headaches.  [name] has ongoing hot flashes and night sweats. She is doing remarkably well overall.  A detailed review of systems was otherwise stable.  PAST MEDICAL HISTORY: Past Medical History  Diagnosis Date  . Breast cancer     mets to liver and lung  . Hypertension   . SVC syndrome   . History of chemotherapy Feb. 2006    taxotere/herceptin/carboplatin  . Radiation 07/31/2006    left upper chest  . Radiation 06/17/2006-06/27/2006    6480 cGy bilat. chest wall  . Neuropathy   . Thrombosis     PAST SURGICAL HISTORY: Past Surgical History  Procedure Laterality Date  . Tubal ligation  1986  . Cholecystectomy  1989  . Mastectomy Bilateral   . Ankle surgery    . Peripherally inserted central catheter insertion  FAMILY HISTORY Family History  Problem Relation Age of Onset  . Heart failure Father   . Cancer Father     Prostate cancer  . Heart failure Brother   . Cancer Brother     Prostate cancer  . Diabetes Maternal Aunt    She had three brothers, one died of gunshot wound, one of complications of diabetes mellitus and one of myocardial infarction.  She has no sisters.  Mother died of complications of brain metastasis in 71.  Father has had a myocardial infarction in 1999.  No history of breast or ovarian cancer in  the family.     GYNECOLOGIC HISTORY:   Menarche at age 63.  Gravida 3, para 3.  First live birth at age 51.  No history of breast feeding. No history of hormonal replacement therapy.   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 Her husband used to work as a Psychologist, educational, but is now retired She has three children, [name] who lives in [place] and works as a Copy, [name] who lives in [place] and works as a Naval architect, and [place] who lives in [place] and also works as a Copy. The patient has 12 grandchildren and 3 great-grandchildren. She attends a [employer].  ADVANCED DIRECTIVES:  Not in place  HEALTH MAINTENANCE: History  Substance Use Topics  . Smoking status: Never Smoker   . Smokeless tobacco: Never Used  . Alcohol Use: Yes     Comment: occasional     Colonoscopy: Never  PAP:  1987  Bone density:  Never  Lipid panel:  Not on file  Allergies  Allergen Reactions  . Adhesive (Tape) Other (See Comments)    Tears skin   . Penicillins Hives    Current Outpatient Prescriptions  Medication Sig Dispense Refill  . ALPRAZolam (XANAX) 1 MG tablet Take 1 tablet (1 mg total) by mouth 3 (three) times daily as needed for sleep.  90 tablet  1  . AmLODIPine Besylate (NORVASC PO) Take 10 mg by mouth every morning.       . cyclobenzaprine (FLEXERIL) 10 MG tablet TAKE 1 TABLET BY MOUTH 3 TIMES A DAY AS NEEDED FOR MUSCLE SPASM  90 tablet  0  . Eszopiclone 3 MG TABS Take 3 mg by mouth as needed. Take immediately before bedtime      . furosemide (LASIX) 80 MG tablet Take 40 mg by mouth as needed.       . gabapentin (NEURONTIN) 300 MG capsule Take 300 mg by mouth 3 (three) times daily.       Marland Kitchen losartan (COZAAR) 100 MG tablet Take 100 mg by mouth every morning.       Marland Kitchen morphine (MS CONTIN) 60 MG 12 hr tablet Take 1 tablet (60 mg total) by mouth 2 (two) times daily.  60 tablet  0  . morphine (MSIR)  15 MG tablet Take 1 tablet (15 mg total) by mouth every 4 (four) hours as needed.  60 tablet  0  . Potassium Chloride (K-TABS PO) Take 40 mEq by mouth as needed.       . prochlorperazine (COMPAZINE) 10 MG tablet Take 10 mg by mouth every 6 (six) hours as needed. nausea      . spironolactone (ALDACTONE) 25 MG tablet Take 0.5 tablets (12.5 mg total) by mouth daily.  15 tablet  3  . warfarin (COUMADIN) 2.5 MG tablet  TAKE 1 TABLET BY MOUTH EVERY DAY  30 tablet  2  . warfarin (COUMADIN) 5 MG tablet TAKE 1 TABLET BY MOUTH EVERY DAY AS DIRECTED  30 tablet  2  . zolpidem (AMBIEN) 5 MG tablet Take 1 tablet (5 mg total) by mouth at bedtime as needed for sleep.  30 tablet  2   No current facility-administered medications for this visit.    OBJECTIVE:  Middle aged African American female in no acute distress. Filed Vitals:   12/19/12 0857  BP: 151/90  Pulse: 78  Temp: 98.1 F (36.7 C)  Resp: 20     Body mass index is 51.05 kg/(m^2).    ECOG FS: 1 Filed Weights   12/19/12 0857  Weight: 288 lb 1.6 oz (130.681 kg)    General appearance: Alert, cooperative, well nourished, no apparent distress Head: Normocephalic, without obvious abnormality, atraumatic Eyes: Arcus senilis, PERRLA, EOMI Nose: Nares, septum and mucosa are normal, no drainage or sinus tenderness. Neck: No adenopathy, supple, symmetrical, trachea midline, thyroid not enlarged, no tenderness Resp: Clear to auscultation bilaterally Cardio: Regular rate and rhythm, S1, S2 normal, no murmur, click, rub or gallop, upper right chest wall PICC line Breasts: Deferred GI: Soft, distended, non-tender, normoactive bowel sounds, excessive habitus Extremities: Extremities normal, atraumatic, no cyanosis or edema Lymph nodes: Cervical, supraclavicular, and axillary nodes normal Neurologic: Grossly normal   LAB RESULTS: Lab Results  Component Value Date   WBC 8.1 12/19/2012   NEUTROABS 3.1 12/19/2012   HGB 12.5 12/19/2012   HCT 35.9  12/19/2012   MCV 77.5* 12/19/2012   PLT 257 12/19/2012      Chemistry      Component Value Date/Time   NA 141 12/19/2012 0814   NA 139 04/11/2012 0837   K 4.0 12/19/2012 0814   K 3.8 04/11/2012 0837   CL 105 12/19/2012 0814   CL 101 04/11/2012 0837   CO2 25 12/19/2012 0814   CO2 27 04/11/2012 0837   BUN 12.9 12/19/2012 0814   BUN 25* 04/11/2012 0837   CREATININE 0.9 12/19/2012 0814   CREATININE 1.12* 04/11/2012 0837      Component Value Date/Time   CALCIUM 9.8 12/19/2012 0814   CALCIUM 8.7 04/11/2012 0837   ALKPHOS 92 12/19/2012 0814   ALKPHOS 87 04/11/2012 0837   AST 11 12/19/2012 0814   AST 19 04/11/2012 0837   ALT 10 12/19/2012 0814   ALT 14 04/11/2012 0837   BILITOT 0.42 12/19/2012 0814   BILITOT 0.8 04/11/2012 0837       Lab Results  Component Value Date   LABCA2 22 08/15/2012   STUDIES: No results found.  ASSESSMENT: 62 y.o.  [place], [place], woman  (1)  with a history of inflammatory right breast cancer metastatic at presentation September 2004 with involvement of liver and bone, HER-2 positive, estrogen and progesterone receptor negative  (2) treated with carboplatin, docetaxel and Herceptin x6 completed April 2005  (3) trastuzumab continued indefinitely; has also received lapatinib and capecitabine for variable intervals in 2007-2008.  (4) status post bilateral mastectomies with bilateral axillary lymph node dissection 12/07/2004, showing  (a) on the right, a mypT1c ypN1 invasive ductal carcinoma, grade 3, estrogen and progesterone receptor negative, HER-2 positive, with an MIB-1 of 31%  (b) on the left, ypT2 ypN1 invasive ductal carcinoma, grade 2, estrogen and progesterone receptor negative, HER-2 positive, with an MIB-1 of 35%.  (5)  Status post radiation June through July of 2006, to the right chest wall, left chest  wall, bilateral supraclavicular fossae, and bilateral axillary boosts; with additional radiation to the right and left chest walls and the central  chest wall completed November of 2007  (6) status post ixempra x9 completed August of 2009.  (7) history of superior vena caval syndrome, on life long anticoagulation   (8)  History of chemotherapy-induced neuropathy. On Neurontin 600 mg p.o. T.i.d., MS Contin, and MSIR.   PLAN: [name] recent 2D echocardiogram results were reviewed with Dr. Darnelle Catalan and the patient will proceed with Herceptin infusion today.     Dr. Darnelle Catalan has previously discussed bisphosphonates at length with [name] and he believes she would benefit from zoledronic acid.  She has not seen a dentist in many years and her dental hygiene is not good.  Since her last office visit, the patient has not established care with a dentist.  The importance of establishing care with the dentist to proceed with zoledronic acid treatment was again discussed with the patient.  She says she has plans to make an appointment soon.  The patient was given prescriptions for Xanax #90 with 1 refill, MSIR #60 with 0 refills, and MS Contin #60 with 0 refills today during her office visit.  [name] chronic anticoagulation therapy is now being managed by the Coumadin Clinic.  Her INR is 4.6 today, and she is aware of bleeding precautions.  We plan to continue to see [name] on an every 3 month basis. We will schedule her to have a restaging pet scan before her next office visit in 02/2013. She knows to call for any problems that may develop before the next visit.  The plan of care for this patient was discussed with Dr. Darnelle Catalan.    Larina Bras,  NP-C 12/21/2012  2:51 PM

## 2012-12-19 NOTE — Telephone Encounter (Signed)
appts made and printed.the patient is aware that cs will call her w/ a scheduled d/t for her PET...td

## 2012-12-19 NOTE — Progress Notes (Signed)
INR = 4.6 on alt 5 mg/7.5 mg daily (she took 5 mg last night) No bleeding.  Pt states she "rarely" bruises. She had a glass of red wine last night. Pt states in the past that she was on 5 mg/day & was stable until recently. INR is now elevated.  There could have been some effect from the red wine she drank last night. I have instructed her to hold her Coumadin dose today.  I think we need to head back toward 5 mg/day at this point.  I'll taper back slowly & have her decrease to 5 mg/day; 7.5 mg Mon/Wed. Recheck INR 12/24/12. Ebony Hail, Pharm.D., CPP 12/19/2012@1 :05 PM

## 2012-12-19 NOTE — Patient Instructions (Addendum)
Please contact us at [phone] if you have any questions or concerns. 

## 2012-12-22 ENCOUNTER — Ambulatory Visit (HOSPITAL_COMMUNITY)
Admission: RE | Admit: 2012-12-22 | Discharge: 2012-12-22 | Disposition: A | Discharge status: Home / Self Care | Payer: Medicare Other | Source: Ambulatory Visit | Attending: Internal Medicine | Admitting: Internal Medicine

## 2012-12-22 VITALS — BP 136/94 | HR 95 | Wt 288.0 lb

## 2012-12-22 DIAGNOSIS — C50919 Malignant neoplasm of unspecified site of unspecified female breast: Secondary | ICD-10-CM

## 2012-12-22 DIAGNOSIS — C50912 Malignant neoplasm of unspecified site of left female breast: Secondary | ICD-10-CM

## 2012-12-22 DIAGNOSIS — R5383 Other fatigue: Secondary | ICD-10-CM

## 2012-12-22 DIAGNOSIS — Z853 Personal history of malignant neoplasm of breast: Secondary | ICD-10-CM | POA: Insufficient documentation

## 2012-12-22 NOTE — Assessment & Plan Note (Addendum)
Dr Gala Romney reviewed and discussed ECHO. EF and lateral s' stable. No evidence of cardiotoxicity. Follow up in 3 months  I reviewed echos personally. EF and Doppler parameters stable. No HF on exam. Continue Herceptin. RV is mildly dilated. Suspect she has severe OSA. Will refer to pulmonary for sleep study.

## 2012-12-22 NOTE — Patient Instructions (Addendum)
Follow up in 3 months with an ECHO and Dr Leory Plowman  We will schedule a sleep study  You have been referred to [facility] [address] [place] 2nd floor     [phone]     Dr. Craige Cotta 01/20/13 @ 10:15am

## 2012-12-22 NOTE — Progress Notes (Signed)
Patient ID: [name], female   DOB: 09-11-1950, 62 y.o.   MRN: [id number]  HPI:  [name] is a 62 year old with a history of metastatic HER-2 positive breast carcinoma originally diagnosed September 2004. Started in R breast. Underwent neo-adjuvant chemo. During this time developed L breast CA. Underwent bilateral mastectomy. Lymph nodes +. 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   She received a total of 6 cycles of Taxotere/Carbo/Herceptin, completed in April 2005, after which she began single agent Herceptin, given every 3 weeks for 9 years.  Plan to continue on Herceptin indefinitely.    04/23/12 ECHO EF 60-65% Lateral s' 8.9 cm/s 07/30/12 ECHO EF 60-65% Lateral s' 8.3 cm/s 09/11/12 ECHO EF 60-65% Lateral s' 10.3 cm/s 12/22/12 ECHO 55-60% Lateral S' 9.8 RV mildly dilated  She returns for follow up. Complains of day time fatigue. Difficulty sleeping. Mild dyspnea with exertion. She  takes lasix 3 times a month for lymph edema. Compliant with medications however she did not take them this am.        Past Medical History  Diagnosis Date  . Breast cancer     mets to liver and lung  . Hypertension   . SVC syndrome   . History of chemotherapy Feb. 2006    taxotere/herceptin/carboplatin  . Radiation 07/31/2006    left upper chest  . Radiation 06/17/2006-06/27/2006    6480 cGy bilat. chest wall  . Neuropathy   . Thrombosis     Current Outpatient Prescriptions  Medication Sig Dispense Refill  . ALPRAZolam (XANAX) 1 MG tablet Take 1 tablet (1 mg total) by mouth 3 (three) times daily as needed for sleep.  90 tablet  1  . AmLODIPine Besylate (NORVASC PO) Take 10 mg by mouth every morning.       . cyclobenzaprine (FLEXERIL) 10 MG tablet TAKE 1 TABLET BY MOUTH 3 TIMES A DAY AS NEEDED FOR MUSCLE SPASM  90 tablet  0  . Eszopiclone 3 MG TABS Take 3 mg by mouth as needed. Take immediately before bedtime      . furosemide (LASIX) 80 MG  tablet Take 40 mg by mouth as needed.       . gabapentin (NEURONTIN) 300 MG capsule Take 300 mg by mouth 3 (three) times daily.       Marland Kitchen losartan (COZAAR) 100 MG tablet Take 100 mg by mouth every morning.       Marland Kitchen morphine (MS CONTIN) 60 MG 12 hr tablet Take 1 tablet (60 mg total) by mouth 2 (two) times daily.  60 tablet  0  . morphine (MSIR) 15 MG tablet Take 1 tablet (15 mg total) by mouth every 4 (four) hours as needed.  60 tablet  0  . Potassium Chloride (K-TABS PO) Take 40 mEq by mouth as needed.       . prochlorperazine (COMPAZINE) 10 MG tablet Take 10 mg by mouth every 6 (six) hours as needed. nausea      . spironolactone (ALDACTONE) 25 MG tablet Take 0.5 tablets (12.5 mg total) by mouth daily.  15 tablet  3  . warfarin (COUMADIN) 2.5 MG tablet TAKE 1 TABLET BY MOUTH EVERY DAY  30 tablet  2  . warfarin (COUMADIN) 5 MG tablet TAKE 1 TABLET BY MOUTH EVERY DAY AS DIRECTED  30 tablet  2  . zolpidem (AMBIEN) 5 MG tablet Take 1 tablet (5 mg total) by mouth at bedtime as needed for sleep.  30 tablet  2   No current facility-administered medications for this encounter.     Allergies  Allergen Reactions  . Adhesive (Tape) Other (See Comments)    Tears skin   . Penicillins Hives    History   Social History  . Marital Status: Married    Spouse Name: N/A    Number of Children: N/A  . Years of Education: N/A   Occupational History  . Not on file.   Social History Main Topics  . Smoking status: Never Smoker   . Smokeless tobacco: Never Used  . Alcohol Use: Yes     Comment: occasional  . Drug Use: No  . Sexually Active: Yes    Birth Control/ Protection: Post-menopausal   Other Topics Concern  . Not on file   Social History Narrative  . No narrative on file    Family History  Problem Relation Age of Onset  . Heart failure Father   . Cancer Father     Prostate cancer  . Heart failure Brother   . Cancer Brother     Prostate cancer  . Diabetes Maternal Aunt      PHYSICAL EXAM: Filed Vitals:   12/22/12 1035  BP: 136/94  Pulse: 95   General:  Well appearing. No respiratory difficulty Husband present  HEENT: normal Neck: supple. no JVD. Carotids 2+ bilat; no bruits. No lymphadenopathy or thryomegaly appreciated. Cor: PMI nondisplaced. Regular rate & rhythm. No rubs, gallops or murmurs.  S/P bilateral mastectomies Lungs: clear Abdomen: obese. soft, nontender, nondistended.  Good bowel sounds. Extremities: no cyanosis, clubbing, rash, 1+ edema Neuro: alert & oriented x 3, cranial nerves grossly intact. moves all 4 extremities w/o difficulty. Affect pleasant.    ASSESSMENT & PLAN:

## 2012-12-23 ENCOUNTER — Other Ambulatory Visit: Payer: Self-pay | Admitting: Oncology

## 2012-12-23 ENCOUNTER — Other Ambulatory Visit: Payer: Self-pay | Admitting: *Deleted

## 2012-12-23 DIAGNOSIS — C50919 Malignant neoplasm of unspecified site of unspecified female breast: Secondary | ICD-10-CM

## 2012-12-23 MED ORDER — ZOLPIDEM TARTRATE 10 MG PO TABS: 10.0000 mg | ORAL_TABLET | Freq: Every evening | ORAL | Status: AC | PRN

## 2012-12-24 ENCOUNTER — Other Ambulatory Visit: Payer: Medicare Other | Admitting: Lab

## 2012-12-24 ENCOUNTER — Ambulatory Visit (HOSPITAL_BASED_OUTPATIENT_CLINIC_OR_DEPARTMENT_OTHER): Payer: Medicare Other | Admitting: Pharmacist

## 2012-12-24 DIAGNOSIS — C50919 Malignant neoplasm of unspecified site of unspecified female breast: Secondary | ICD-10-CM

## 2012-12-24 DIAGNOSIS — I871 Compression of vein: Secondary | ICD-10-CM

## 2012-12-24 DIAGNOSIS — Z86718 Personal history of other venous thrombosis and embolism: Secondary | ICD-10-CM

## 2012-12-24 DIAGNOSIS — I82409 Acute embolism and thrombosis of unspecified deep veins of unspecified lower extremity: Secondary | ICD-10-CM

## 2012-12-24 LAB — PROTIME-INR
INR: 1.8 — ABNORMAL LOW (ref 2.00–3.50)
Protime: 21.6 Seconds — ABNORMAL HIGH (ref 10.6–13.4)

## 2012-12-24 NOTE — Patient Instructions (Signed)
Continue coumadin 5mg  daily except 7.5mg  on Monday & Wednesday . Recheck INR on 01/09/13; lab at 9:30 am,treatment at 10 am and coumadin clinic at 10:15am.

## 2012-12-24 NOTE — Progress Notes (Signed)
INR slightly below goal today. No problems to report regarding anticoagulation. No s/s of clotting. No changes in diet, medications, etc. Pt has taken coumadin as instructed at last visit on 12/19/12. She held coumadin on 12/19/12 and started 5mg  daily except 7.5mg  on M&W on 12/20/12. Will continue coumadin 5mg  daily except 7.5mg  on Monday & Wednesday . Recheck INR on 01/09/13; lab at 9:30 am, treatment at 10 am and coumadin clinic at 10:15am. Pt travels from [place] and wishes to minimize trips to the clinic.

## 2012-12-25 ENCOUNTER — Telehealth: Payer: Self-pay | Admitting: Oncology

## 2012-12-25 ENCOUNTER — Other Ambulatory Visit: Payer: Self-pay | Admitting: *Deleted

## 2012-12-25 NOTE — Telephone Encounter (Signed)
SENT TANYA Hildebrant A STAFF MESSAG TO R.S THE PT'S LAB AND CHEMO APPTS IN June.

## 2012-12-25 NOTE — Progress Notes (Signed)
Pt called to this RN to state need to reschedule herceptin scheduled for 5/27 due to now having a sleep study per Dr Milas Kocher.  This RN placed e-pof to reschedule to 01/17/2013.

## 2012-12-26 ENCOUNTER — Telehealth: Payer: Self-pay | Admitting: Oncology

## 2012-12-26 NOTE — Telephone Encounter (Signed)
S/W THE PT AND SHE IS AWARE OF HER June 24TH APPTS THAT WERE R/S FROM 02/20/2013

## 2013-01-03 ENCOUNTER — Other Ambulatory Visit: Payer: Self-pay | Admitting: Oncology

## 2013-01-03 DIAGNOSIS — I82409 Acute embolism and thrombosis of unspecified deep veins of unspecified lower extremity: Secondary | ICD-10-CM

## 2013-01-09 ENCOUNTER — Ambulatory Visit: Payer: Medicare Other | Admitting: Pharmacist

## 2013-01-09 ENCOUNTER — Other Ambulatory Visit: Payer: Self-pay | Admitting: Oncology

## 2013-01-09 ENCOUNTER — Other Ambulatory Visit: Payer: Self-pay | Admitting: *Deleted

## 2013-01-09 ENCOUNTER — Other Ambulatory Visit (HOSPITAL_BASED_OUTPATIENT_CLINIC_OR_DEPARTMENT_OTHER): Payer: Medicare Other | Admitting: Lab

## 2013-01-09 ENCOUNTER — Ambulatory Visit (HOSPITAL_BASED_OUTPATIENT_CLINIC_OR_DEPARTMENT_OTHER): Payer: Medicare Other

## 2013-01-09 VITALS — BP 133/66 | HR 82 | Temp 98.7°F | Resp 16

## 2013-01-09 DIAGNOSIS — C50919 Malignant neoplasm of unspecified site of unspecified female breast: Secondary | ICD-10-CM

## 2013-01-09 DIAGNOSIS — C50119 Malignant neoplasm of central portion of unspecified female breast: Secondary | ICD-10-CM

## 2013-01-09 DIAGNOSIS — C50219 Malignant neoplasm of upper-inner quadrant of unspecified female breast: Secondary | ICD-10-CM

## 2013-01-09 DIAGNOSIS — Z5112 Encounter for antineoplastic immunotherapy: Secondary | ICD-10-CM

## 2013-01-09 DIAGNOSIS — I871 Compression of vein: Secondary | ICD-10-CM

## 2013-01-09 DIAGNOSIS — Z7901 Long term (current) use of anticoagulants: Secondary | ICD-10-CM

## 2013-01-09 DIAGNOSIS — C50912 Malignant neoplasm of unspecified site of left female breast: Secondary | ICD-10-CM

## 2013-01-09 LAB — COMPREHENSIVE METABOLIC PANEL (CC13)
AST: 13 U/L (ref 5–34)
Alkaline Phosphatase: 81 U/L (ref 40–150)
BUN: 14 mg/dL (ref 7.0–26.0)
Creatinine: 1 mg/dL (ref 0.6–1.1)

## 2013-01-09 LAB — CBC WITH DIFFERENTIAL/PLATELET
BASO%: 0.2 % (ref 0.0–2.0)
LYMPH%: 39.8 % (ref 14.0–49.7)
MCHC: 34.7 g/dL (ref 31.5–36.0)
MCV: 77.6 fL — ABNORMAL LOW (ref 79.5–101.0)
MONO%: 7.2 % (ref 0.0–14.0)
Platelets: 249 10*3/uL (ref 145–400)
RBC: 4.64 10*6/uL (ref 3.70–5.45)
WBC: 11.4 10*3/uL — ABNORMAL HIGH (ref 3.9–10.3)
nRBC: 0 % (ref 0–0)

## 2013-01-09 LAB — PROTIME-INR
INR: 3.1 (ref 2.00–3.50)
Protime: 37.2 Seconds — ABNORMAL HIGH (ref 10.6–13.4)

## 2013-01-09 MED ORDER — LORAZEPAM 2 MG/ML IJ SOLN
1.0000 mg | Freq: Once | INTRAMUSCULAR | Status: AC | PRN
  Administered 2013-01-09: 1 mg via INTRAVENOUS

## 2013-01-09 MED ORDER — POTASSIUM CHLORIDE ER 10 MEQ PO CPCR: 10.0000 meq | ORAL_CAPSULE | Freq: Two times a day (BID) | ORAL | Status: DC

## 2013-01-09 MED ORDER — HEPARIN SOD (PORK) LOCK FLUSH 100 UNIT/ML IV SOLN
250.0000 [IU] | Freq: Once | INTRAVENOUS | Status: AC | PRN
  Administered 2013-01-09: 250 [IU]
  Filled 2013-01-09: qty 5

## 2013-01-09 MED ORDER — ACETAMINOPHEN 325 MG PO TABS
650.0000 mg | ORAL_TABLET | Freq: Once | ORAL | Status: AC
  Administered 2013-01-09: 650 mg via ORAL

## 2013-01-09 MED ORDER — TRASTUZUMAB CHEMO INJECTION 440 MG
6.0000 mg/kg | Freq: Once | INTRAVENOUS | Status: AC
  Administered 2013-01-09: 756 mg via INTRAVENOUS
  Filled 2013-01-09: qty 36

## 2013-01-09 MED ORDER — SODIUM CHLORIDE 0.9 % IJ SOLN
10.0000 mL | INTRAMUSCULAR | Status: DC | PRN
  Administered 2013-01-09: 10 mL
  Filled 2013-01-09: qty 10

## 2013-01-09 MED ORDER — SODIUM CHLORIDE 0.9 % IV SOLN
Freq: Once | INTRAVENOUS | Status: AC
  Administered 2013-01-09: 10:00:00 via INTRAVENOUS

## 2013-01-09 MED ORDER — DIPHENHYDRAMINE HCL 25 MG PO CAPS
50.0000 mg | ORAL_CAPSULE | Freq: Once | ORAL | Status: AC
  Administered 2013-01-09: 50 mg via ORAL

## 2013-01-09 NOTE — Progress Notes (Signed)
Pt seen in infusion today.   INR=3.1 Coumadin 5mg  daily with 7.5 mg on Mon and Wed. No changes to report. She states she may be incorporating more green vegetables in her diet from garden over these next few weeks. She will be seen in her next infusion appmt on 02/09/13 at 10:15.

## 2013-01-09 NOTE — Patient Instructions (Signed)
Continue coumadin 5mg  daily except 7.5mg  on M&W. Recheck INR on 02/09/13; lab at 9:30 am,treatment at 10 am and coumadin clinic at 10:15am.

## 2013-01-09 NOTE — Patient Instructions (Addendum)
[facility] Discharge Instructions for Patients Receiving Chemotherapy  Today you received the following chemotherapy agents herceptin   If you develop nausea and vomiting that is not controlled by your nausea medication, call the clinic. If it is after clinic hours your family physician or the after hours number for the clinic or go to the Emergency Department.   BELOW ARE SYMPTOMS THAT SHOULD BE REPORTED IMMEDIATELY:  *FEVER GREATER THAN 100.5 F  *CHILLS WITH OR WITHOUT FEVER  NAUSEA AND VOMITING THAT IS NOT CONTROLLED WITH YOUR NAUSEA MEDICATION  *UNUSUAL SHORTNESS OF BREATH  *UNUSUAL BRUISING OR BLEEDING  TENDERNESS IN MOUTH AND THROAT WITH OR WITHOUT PRESENCE OF ULCERS  *URINARY PROBLEMS  *BOWEL PROBLEMS  UNUSUAL RASH Items with * indicate a potential emergency and should be followed up as soon as possible.   Feel free to call the clinic you have any questions or concerns. The clinic phone number is [phone].   I have been informed and understand all the instructions given to me. I know to contact the clinic, my physician, or go to the Emergency Department if any problems should occur. I do not have any questions at this time, but understand that I may call the clinic during office hours   should I have any questions or need assistance in obtaining follow up care.    __________________________________________  _____________  __________ Signature of Patient or Authorized Representative            Date                   Time    __________________________________________ Nurse's Signature    

## 2013-01-19 ENCOUNTER — Other Ambulatory Visit (HOSPITAL_COMMUNITY): Payer: Self-pay | Admitting: Internal Medicine

## 2013-01-20 ENCOUNTER — Institutional Professional Consult (permissible substitution): Payer: Medicare Other | Admitting: Pulmonary Disease

## 2013-01-30 ENCOUNTER — Ambulatory Visit (HOSPITAL_BASED_OUTPATIENT_CLINIC_OR_DEPARTMENT_OTHER): Payer: Medicare Other

## 2013-01-30 ENCOUNTER — Other Ambulatory Visit (HOSPITAL_BASED_OUTPATIENT_CLINIC_OR_DEPARTMENT_OTHER): Payer: Medicare Other | Admitting: Lab

## 2013-01-30 ENCOUNTER — Ambulatory Visit: Payer: Medicare Other | Admitting: Pharmacist

## 2013-01-30 VITALS — BP 142/89 | HR 90 | Temp 97.6°F | Resp 18

## 2013-01-30 DIAGNOSIS — C50912 Malignant neoplasm of unspecified site of left female breast: Secondary | ICD-10-CM

## 2013-01-30 DIAGNOSIS — Z5112 Encounter for antineoplastic immunotherapy: Secondary | ICD-10-CM

## 2013-01-30 DIAGNOSIS — C7952 Secondary malignant neoplasm of bone marrow: Secondary | ICD-10-CM

## 2013-01-30 DIAGNOSIS — Z86718 Personal history of other venous thrombosis and embolism: Secondary | ICD-10-CM

## 2013-01-30 DIAGNOSIS — C50119 Malignant neoplasm of central portion of unspecified female breast: Secondary | ICD-10-CM

## 2013-01-30 DIAGNOSIS — C7951 Secondary malignant neoplasm of bone: Secondary | ICD-10-CM

## 2013-01-30 DIAGNOSIS — C50219 Malignant neoplasm of upper-inner quadrant of unspecified female breast: Secondary | ICD-10-CM

## 2013-01-30 DIAGNOSIS — I871 Compression of vein: Secondary | ICD-10-CM

## 2013-01-30 DIAGNOSIS — C50919 Malignant neoplasm of unspecified site of unspecified female breast: Secondary | ICD-10-CM

## 2013-01-30 LAB — CBC WITH DIFFERENTIAL/PLATELET
Basophils Absolute: 0 10*3/uL (ref 0.0–0.1)
HCT: 34.5 % — ABNORMAL LOW (ref 34.8–46.6)
HGB: 11.9 g/dL (ref 11.6–15.9)
LYMPH%: 45.8 % (ref 14.0–49.7)
MONO#: 0.7 10*3/uL (ref 0.1–0.9)
NEUT%: 42.2 % (ref 38.4–76.8)
Platelets: 224 10*3/uL (ref 145–400)
WBC: 8.2 10*3/uL (ref 3.9–10.3)
lymph#: 3.8 10*3/uL — ABNORMAL HIGH (ref 0.9–3.3)

## 2013-01-30 LAB — COMPREHENSIVE METABOLIC PANEL (CC13)
ALT: 12 U/L (ref 0–55)
AST: 13 U/L (ref 5–34)
Albumin: 3.1 g/dL — ABNORMAL LOW (ref 3.5–5.0)
Calcium: 9.2 mg/dL (ref 8.4–10.4)
Chloride: 104 mEq/L (ref 98–107)
Potassium: 3.9 mEq/L (ref 3.5–5.1)
Sodium: 141 mEq/L (ref 136–145)
Total Protein: 7.7 g/dL (ref 6.4–8.3)

## 2013-01-30 LAB — PROTIME-INR: INR: 3.3 (ref 2.00–3.50)

## 2013-01-30 LAB — POCT INR: INR: 3.3

## 2013-01-30 MED ORDER — TRASTUZUMAB CHEMO INJECTION 440 MG
6.0000 mg/kg | Freq: Once | INTRAVENOUS | Status: AC
  Administered 2013-01-30: 756 mg via INTRAVENOUS
  Filled 2013-01-30: qty 36

## 2013-01-30 MED ORDER — ACETAMINOPHEN 325 MG PO TABS
650.0000 mg | ORAL_TABLET | Freq: Once | ORAL | Status: AC
  Administered 2013-01-30: 650 mg via ORAL

## 2013-01-30 MED ORDER — DIPHENHYDRAMINE HCL 25 MG PO CAPS
50.0000 mg | ORAL_CAPSULE | Freq: Once | ORAL | Status: AC
  Administered 2013-01-30: 50 mg via ORAL

## 2013-01-30 MED ORDER — HEPARIN SOD (PORK) LOCK FLUSH 100 UNIT/ML IV SOLN
250.0000 [IU] | Freq: Once | INTRAVENOUS | Status: AC | PRN
  Administered 2013-01-30: 500 [IU]
  Filled 2013-01-30: qty 5

## 2013-01-30 MED ORDER — SODIUM CHLORIDE 0.9 % IV SOLN
Freq: Once | INTRAVENOUS | Status: AC
  Administered 2013-01-30: 10:00:00 via INTRAVENOUS

## 2013-01-30 MED ORDER — LORAZEPAM 2 MG/ML IJ SOLN
1.0000 mg | Freq: Once | INTRAMUSCULAR | Status: AC | PRN
  Administered 2013-01-30: 1 mg via INTRAVENOUS

## 2013-01-30 MED ORDER — SODIUM CHLORIDE 0.9 % IJ SOLN
10.0000 mL | INTRAMUSCULAR | Status: DC | PRN
  Administered 2013-01-30: 10 mL
  Filled 2013-01-30: qty 10

## 2013-01-30 NOTE — Patient Instructions (Signed)
[facility] Discharge Instructions for Patients Receiving Chemotherapy  Today you received the following chemotherapy agents Herceptin  To help prevent nausea and vomiting after your treatment, we encourage you to take your nausea medication     If you develop nausea and vomiting that is not controlled by your nausea medication, call the clinic.   BELOW ARE SYMPTOMS THAT SHOULD BE REPORTED IMMEDIATELY:  *FEVER GREATER THAN 100.5 F  *CHILLS WITH OR WITHOUT FEVER  NAUSEA AND VOMITING THAT IS NOT CONTROLLED WITH YOUR NAUSEA MEDICATION  *UNUSUAL SHORTNESS OF BREATH  *UNUSUAL BRUISING OR BLEEDING  TENDERNESS IN MOUTH AND THROAT WITH OR WITHOUT PRESENCE OF ULCERS  *URINARY PROBLEMS  *BOWEL PROBLEMS  UNUSUAL RASH Items with * indicate a potential emergency and should be followed up as soon as possible.  Feel free to call the clinic you have any questions or concerns. The clinic phone number is [phone].    

## 2013-01-30 NOTE — Patient Instructions (Signed)
Enjoy an extra serving or two of your favorite greens. Continue coumadin 5mg  daily except 7.5mg  on M&W. Recheck INR on 02/17/13; lab at 9:30am, treatment at 10am and coumadin clinic at 10:15am.

## 2013-01-30 NOTE — Progress Notes (Signed)
INR slightly above goal. INR nearly unchanged from previous INR (=3.1) ~ 3 weeks ago. No problems to report. Pt started taking a prescription potassium supplement. She was on an OTC potassium supplement prior to that. No other changes to report. Pt doing well. Pt plans to enjoy a few extra servings of cabbage over the next few days. She loves her greens. Will continue coumadin 5mg  daily except 7.5mg  on M&W. Recheck INR with next scheduled treatment on 02/17/13; lab at 9:30am, treatment at 10am and coumadin clinic at 10:15am.

## 2013-02-16 ENCOUNTER — Other Ambulatory Visit: Payer: Self-pay | Admitting: *Deleted

## 2013-02-17 ENCOUNTER — Ambulatory Visit: Payer: Medicare Other

## 2013-02-17 ENCOUNTER — Other Ambulatory Visit: Payer: Medicare Other | Admitting: Lab

## 2013-02-20 ENCOUNTER — Telehealth: Payer: Self-pay | Admitting: *Deleted

## 2013-02-20 ENCOUNTER — Ambulatory Visit: Payer: Medicare Other

## 2013-02-20 ENCOUNTER — Other Ambulatory Visit: Payer: Self-pay | Admitting: *Deleted

## 2013-02-20 ENCOUNTER — Other Ambulatory Visit: Payer: Medicare Other | Admitting: Lab

## 2013-02-20 DIAGNOSIS — I871 Compression of vein: Secondary | ICD-10-CM

## 2013-02-20 DIAGNOSIS — C50919 Malignant neoplasm of unspecified site of unspecified female breast: Secondary | ICD-10-CM

## 2013-02-20 DIAGNOSIS — I82409 Acute embolism and thrombosis of unspecified deep veins of unspecified lower extremity: Secondary | ICD-10-CM

## 2013-02-20 MED ORDER — FUROSEMIDE 80 MG PO TABS: 40.0000 mg | ORAL_TABLET | ORAL | Status: DC | PRN

## 2013-02-20 MED ORDER — ESZOPICLONE 3 MG PO TABS: 3.0000 mg | ORAL_TABLET | ORAL | Status: DC | PRN

## 2013-02-20 MED ORDER — ALPRAZOLAM 1 MG PO TABS: 1.0000 mg | ORAL_TABLET | Freq: Three times a day (TID) | ORAL | Status: DC | PRN

## 2013-02-20 MED ORDER — WARFARIN SODIUM 2.5 MG PO TABS: 2.5000 mg | ORAL_TABLET | Freq: Every day | ORAL | Status: DC

## 2013-02-20 MED ORDER — MORPHINE SULFATE ER 60 MG PO TBCR: 60.0000 mg | EXTENDED_RELEASE_TABLET | Freq: Two times a day (BID) | ORAL | Status: DC

## 2013-02-20 MED ORDER — MORPHINE SULFATE 15 MG PO TABS: 15.0000 mg | ORAL_TABLET | ORAL | Status: DC | PRN

## 2013-02-20 NOTE — Telephone Encounter (Signed)
Pt came her today because she was not aware that her appts was cancel. She sw Val to have every thing complete on 02/26/13. Val placed an pof. appts was made and printed...td

## 2013-02-23 ENCOUNTER — Encounter (HOSPITAL_COMMUNITY)
Admission: RE | Admit: 2013-02-23 | Discharge: 2013-02-23 | Disposition: A | Discharge status: Home / Self Care | Payer: Medicare Other | Source: Ambulatory Visit | Attending: Family | Admitting: Family

## 2013-02-23 DIAGNOSIS — C50919 Malignant neoplasm of unspecified site of unspecified female breast: Secondary | ICD-10-CM

## 2013-02-23 MED ORDER — FLUDEOXYGLUCOSE F - 18 (FDG) INJECTION
15.4000 | Freq: Once | INTRAVENOUS | Status: AC | PRN
  Administered 2013-02-23: 15.4 via INTRAVENOUS

## 2013-02-24 ENCOUNTER — Institutional Professional Consult (permissible substitution): Payer: Medicare Other | Admitting: Pulmonary Disease

## 2013-02-26 ENCOUNTER — Other Ambulatory Visit: Payer: Medicare Other | Admitting: Lab

## 2013-02-26 ENCOUNTER — Other Ambulatory Visit (HOSPITAL_BASED_OUTPATIENT_CLINIC_OR_DEPARTMENT_OTHER): Payer: Medicare Other | Admitting: Lab

## 2013-02-26 ENCOUNTER — Ambulatory Visit: Payer: Medicare Other

## 2013-02-26 ENCOUNTER — Encounter: Payer: Self-pay | Admitting: Physician Assistant

## 2013-02-26 ENCOUNTER — Ambulatory Visit (HOSPITAL_BASED_OUTPATIENT_CLINIC_OR_DEPARTMENT_OTHER): Payer: Medicare Other

## 2013-02-26 ENCOUNTER — Ambulatory Visit (HOSPITAL_BASED_OUTPATIENT_CLINIC_OR_DEPARTMENT_OTHER): Payer: Medicare Other | Admitting: Physician Assistant

## 2013-02-26 ENCOUNTER — Ambulatory Visit: Payer: Medicare Other | Admitting: Physician Assistant

## 2013-02-26 ENCOUNTER — Telehealth: Payer: Self-pay | Admitting: *Deleted

## 2013-02-26 ENCOUNTER — Ambulatory Visit: Payer: Medicare Other | Admitting: Pharmacist

## 2013-02-26 VITALS — BP 130/88 | HR 73 | Temp 97.9°F | Resp 20 | Ht 63.0 in | Wt 278.4 lb

## 2013-02-26 DIAGNOSIS — F419 Anxiety disorder, unspecified: Secondary | ICD-10-CM

## 2013-02-26 DIAGNOSIS — C7951 Secondary malignant neoplasm of bone: Secondary | ICD-10-CM

## 2013-02-26 DIAGNOSIS — C50912 Malignant neoplasm of unspecified site of left female breast: Secondary | ICD-10-CM

## 2013-02-26 DIAGNOSIS — I871 Compression of vein: Secondary | ICD-10-CM

## 2013-02-26 DIAGNOSIS — I10 Essential (primary) hypertension: Secondary | ICD-10-CM

## 2013-02-26 DIAGNOSIS — C50919 Malignant neoplasm of unspecified site of unspecified female breast: Secondary | ICD-10-CM

## 2013-02-26 DIAGNOSIS — C787 Secondary malignant neoplasm of liver and intrahepatic bile duct: Secondary | ICD-10-CM

## 2013-02-26 DIAGNOSIS — Z5112 Encounter for antineoplastic immunotherapy: Secondary | ICD-10-CM

## 2013-02-26 DIAGNOSIS — C50911 Malignant neoplasm of unspecified site of right female breast: Secondary | ICD-10-CM

## 2013-02-26 DIAGNOSIS — Z7901 Long term (current) use of anticoagulants: Secondary | ICD-10-CM

## 2013-02-26 DIAGNOSIS — I82409 Acute embolism and thrombosis of unspecified deep veins of unspecified lower extremity: Secondary | ICD-10-CM

## 2013-02-26 HISTORY — DX: Malignant neoplasm of unspecified site of unspecified female breast: C50.919

## 2013-02-26 LAB — COMPREHENSIVE METABOLIC PANEL (CC13)
Albumin: 3.6 g/dL (ref 3.5–5.0)
Alkaline Phosphatase: 98 U/L (ref 40–150)
BUN: 8.9 mg/dL (ref 7.0–26.0)
Creatinine: 0.9 mg/dL (ref 0.6–1.1)
Glucose: 93 mg/dl (ref 70–140)
Total Bilirubin: 0.89 mg/dL (ref 0.20–1.20)

## 2013-02-26 LAB — CBC WITH DIFFERENTIAL/PLATELET
BASO%: 0.3 % (ref 0.0–2.0)
EOS%: 1.5 % (ref 0.0–7.0)
LYMPH%: 31.3 % (ref 14.0–49.7)
MCHC: 35.1 g/dL (ref 31.5–36.0)
MCV: 76.5 fL — ABNORMAL LOW (ref 79.5–101.0)
MONO%: 13.6 % (ref 0.0–14.0)
Platelets: 220 10*3/uL (ref 145–400)
RBC: 4.39 10*6/uL (ref 3.70–5.45)

## 2013-02-26 LAB — PROTIME-INR: Protime: 21.6 Seconds — ABNORMAL HIGH (ref 10.6–13.4)

## 2013-02-26 MED ORDER — SODIUM CHLORIDE 0.9 % IJ SOLN
10.0000 mL | INTRAMUSCULAR | Status: DC | PRN
  Administered 2013-02-26: 10 mL
  Filled 2013-02-26: qty 10

## 2013-02-26 MED ORDER — LORAZEPAM 2 MG/ML IJ SOLN
1.0000 mg | Freq: Once | INTRAMUSCULAR | Status: AC | PRN
  Administered 2013-02-26: 1 mg via INTRAVENOUS

## 2013-02-26 MED ORDER — SODIUM CHLORIDE 0.9 % IV SOLN
Freq: Once | INTRAVENOUS | Status: AC
  Administered 2013-02-26: 16:00:00 via INTRAVENOUS

## 2013-02-26 MED ORDER — TRASTUZUMAB CHEMO INJECTION 440 MG
6.0000 mg/kg | Freq: Once | INTRAVENOUS | Status: AC
  Administered 2013-02-26: 756 mg via INTRAVENOUS
  Filled 2013-02-26: qty 36

## 2013-02-26 MED ORDER — HEPARIN SOD (PORK) LOCK FLUSH 100 UNIT/ML IV SOLN
500.0000 [IU] | Freq: Once | INTRAVENOUS | Status: AC | PRN
  Administered 2013-02-26: 500 [IU]
  Filled 2013-02-26: qty 5

## 2013-02-26 MED ORDER — ACETAMINOPHEN 325 MG PO TABS
650.0000 mg | ORAL_TABLET | Freq: Once | ORAL | Status: AC
  Administered 2013-02-26: 650 mg via ORAL

## 2013-02-26 MED ORDER — DIPHENHYDRAMINE HCL 25 MG PO CAPS
50.0000 mg | ORAL_CAPSULE | Freq: Once | ORAL | Status: AC
  Administered 2013-02-26: 50 mg via ORAL

## 2013-02-26 NOTE — Patient Instructions (Addendum)
[facility] Discharge Instructions for Patients Receiving Chemotherapy  Today you received the following chemotherapy agents Herceptin.  To help prevent nausea and vomiting after your treatment, we encourage you to take your nausea medication as prescribed.   If you develop nausea and vomiting that is not controlled by your nausea medication, call the clinic.   BELOW ARE SYMPTOMS THAT SHOULD BE REPORTED IMMEDIATELY:  *FEVER GREATER THAN 100.5 F  *CHILLS WITH OR WITHOUT FEVER  NAUSEA AND VOMITING THAT IS NOT CONTROLLED WITH YOUR NAUSEA MEDICATION  *UNUSUAL SHORTNESS OF BREATH  *UNUSUAL BRUISING OR BLEEDING  TENDERNESS IN MOUTH AND THROAT WITH OR WITHOUT PRESENCE OF ULCERS  *URINARY PROBLEMS  *BOWEL PROBLEMS  UNUSUAL RASH Items with * indicate a potential emergency and should be followed up as soon as possible.  Feel free to call the clinic you have any questions or concerns. The clinic phone number is [phone].    

## 2013-02-26 NOTE — Telephone Encounter (Signed)
Per staff message and POF I have scheduled appts.  JMW  

## 2013-02-26 NOTE — Progress Notes (Signed)
INR = 1.8 on Coumadin 5 mg/day; 7.5 mg Mon/Wed. Pt forgot at least one day this week of her Coumadin (5 mg day).  She was not at home to take her meds. Asymptomatic for VTE. Due for chemo (Herceptin every 3 weeks) today. INR low but likely due to missed dose(s).  Continue same dose of Coumadin. Repeat protime in 3 weeks.  We can see her in infusion same day. Ebony Hail, Pharm.D., CPP 02/26/2013@2 :05 PM

## 2013-02-26 NOTE — Telephone Encounter (Signed)
appts made and printed...td 

## 2013-02-26 NOTE — Progress Notes (Signed)
[facility]  [phone] [phone]  OFFICE PROGRESS NOTE  ID: MAJESTY STEHLIN   DOB: 09-Nov-1950  MR#: [id number]  JYN#:[id number]  PCP: Philemon Kingdom, MD GYN:  SU:  OTHER MD: Elmon Kirschner, Antony Blackbird   HISTORY OF PRESENT ILLNESS:   From Dr. Renelda Loma note 06/04/2003: "This woman has been in good health all of her life.  She noted a swelling and discomfort in her right breast in June of this year.  She was seen in the Emergency Room in [place] and was treated for mastitis.  She was treated for a number of months with mastitis and the swelling did not get better. She was given hydrocodone and Cipro.  Finally, the swelling did get better and ultimately the nipple became retracted and she noticed some dimpling in her skin. She had a mammogram in July of this year in [place] with subsequent mammogram on May 27, 2003, by Dr. Yolanda Bonine.  Mammogram done on September 30 showed marked increased density in the left breast.  Biopsy was performed the same day.  It was noted at the 12 o'clock position, deep in the breast was a focal hypoechoic mass, at least 3.5 cm in diameter.  Biopsy did in fact show invasive in situ mammary carcinoma. This was felt to be both at least intermediate, high grade.  No definite lymphovascular invasion was identified. ER and PR, Her2 testing is pending.  Clover continues to have pain in her breast.  She continues to take hydrocodone a number of times a day.  She has been seen by Dr. Earlene Plater, who felt that neoadjuvant chemotherapy would be required.    Initial staging studies showed evidence of liver and lung mets.   Patient also has evidence of bone lesions. Patient started neoadjuvant chemotherapy, Taxotere/Carbo/Herceptin in October 2004.   Patient had a CT scan in December 2004 which demonstrated extensive clot in the SVC innominate vein, bilateral jugular vein and  She was started on anticoagulation therapy. She  received a total of 6 cycles of Taxotere/Carbo/Herceptin, completed in April 2005."  Her subsequent history is as detailed below.  INTERVAL HISTORY: Josaphine returns today accompanied by her husband, [name], for follow up of her metastatic breast cancer.  She continues to receive trastuzumab every 3 weeks and is due for her next dose today. She is also followed by the Coumadin clinic every 3 weeks for lifelong anticoagulant showed in the setting of previous SVC.  She is here today for followup and also to review her recent restaging PET scan, obtain 02/23/2013.  REVIEW OF SYSTEMS: Tramya continues to have pain in her right shoulder and left hip.  Neither of these are new complaints, and overall the pain is stable. She takes her pain medication appropriately. She can often relieve the pain somewhat by repositioning. The shoulder appears to be worse than the hip, however, and the pain in the right shoulder does limit her range of motion somewhat. She denies any known injuries take either joint. She also has pain bilaterally in the axillae, thought to be associated with postsurgical changes and scar tissue. She continues to have neuropathy affecting  her fingers and her lower extremities.  She denies any recent fevers, but does have some hot flashes and night sweats. She tells me she often "wakes up in transient". She has occasional headaches which she tells me have been diagnosed by Dr. Sudie Bailey as migraines. These are stable. She has no change in vision and no dizziness. She still  has some anxiety and mood swings. She finds the alprazolam helpful with this.   She's eating and drinking well with no nausea and no recent change in bowel habits. She has had no signs of abnormal bleeding. She has no increased shortness of breath, and no orthopnea. No peripheral swelling.  A detailed review of systems is otherwise stable and noncontributory.   PAST MEDICAL HISTORY: Past Medical History  Diagnosis Date  .  Breast cancer     mets to liver and lung  . Hypertension   . SVC syndrome   . History of chemotherapy Feb. 2006    taxotere/herceptin/carboplatin  . Radiation 07/31/2006    left upper chest  . Radiation 06/17/2006-06/27/2006    6480 cGy bilat. chest wall  . Neuropathy   . Thrombosis   . Breast cancer metastasized to multiple sites 02/26/2013    PAST SURGICAL HISTORY: Past Surgical History  Procedure Laterality Date  . Tubal ligation  1986  . Cholecystectomy  1989  . Mastectomy Bilateral   . Ankle surgery    . Peripherally inserted central catheter insertion      FAMILY HISTORY Family History  Problem Relation Age of Onset  . Heart failure Father   . Cancer Father     Prostate cancer  . Heart failure Brother   . Cancer Brother     Prostate cancer  . Diabetes Maternal Aunt    She had three brothers, one died of gunshot wound, one of complications of diabetes mellitus and one of myocardial infarction.  She has no sisters.  Mother died of complications of brain metastasis in 26.  Father has had a myocardial infarction in 1999.  No history of breast or ovarian cancer in the family.     GYNECOLOGIC HISTORY:   Menarche at age 54.  Gravida 3, para 3.  First live birth at age 58.  No history of breast feeding. No history of hormonal replacement therapy.   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 Her husband used to work as a Psychologist, educational, but is now retired She has three children, [name] who lives in [place] and works as a Copy, [name] who lives in [place] and works as a Naval architect, and [place] who lives in [place] and also works as a Copy. The patient has 12 grandchildren and 3 great-grandchildren. She attends a [employer].  ADVANCED DIRECTIVES:  Not in place  HEALTH MAINTENANCE: History  Substance Use Topics  . Smoking status: Never Smoker   . Smokeless tobacco: Never Used   . Alcohol Use: Yes     Comment: occasional     Colonoscopy: Never  PAP:  1987  Bone density:  Never  Lipid panel:  Not on file  Allergies  Allergen Reactions  . Adhesive (Tape) Other (See Comments)    Tears skin   . Penicillins Hives    Current Outpatient Prescriptions  Medication Sig Dispense Refill  . ALPRAZolam (XANAX) 1 MG tablet Take 1 tablet (1 mg total) by mouth 3 (three) times daily as needed for anxiety.  90 tablet  1  . AmLODIPine Besylate (NORVASC PO) Take 10 mg by mouth every morning.       . cyclobenzaprine (FLEXERIL) 10 MG tablet TAKE 1 TABLET BY MOUTH 3 TIMES A DAY AS NEEDED FOR MUSCLE SPASM  90 tablet  0  . Eszopiclone 3 MG TABS Take 1 tablet (3  mg total) by mouth as needed. Take immediately before bedtime  30 tablet  3  . furosemide (LASIX) 80 MG tablet Take 0.5 tablets (40 mg total) by mouth as needed.  60 tablet  1  . gabapentin (NEURONTIN) 300 MG capsule Take 300 mg by mouth 3 (three) times daily.       Marland Kitchen losartan (COZAAR) 100 MG tablet Take 100 mg by mouth every morning.       Marland Kitchen morphine (MS CONTIN) 60 MG 12 hr tablet Take 1 tablet (60 mg total) by mouth 2 (two) times daily.  60 tablet  0  . morphine (MSIR) 15 MG tablet Take 1 tablet (15 mg total) by mouth every 4 (four) hours as needed.  60 tablet  0  . potassium chloride (MICRO-K) 10 MEQ CR capsule Take 1 capsule (10 mEq total) by mouth 2 (two) times daily.  30 capsule  3  . prochlorperazine (COMPAZINE) 10 MG tablet Take 10 mg by mouth every 6 (six) hours as needed. nausea      . spironolactone (ALDACTONE) 25 MG tablet TAKE 1/2 TABLET BY MOUTH EVERY DAY  15 tablet  3  . warfarin (COUMADIN) 2.5 MG tablet Take 1 tablet (2.5 mg total) by mouth daily.  30 tablet  2  . warfarin (COUMADIN) 5 MG tablet TAKE 1 TABLET BY MOUTH EVERY DAY AS DIRECTED  30 tablet  1  . VOLTAREN 1 % GEL       . zolpidem (AMBIEN) 10 MG tablet        No current facility-administered medications for this visit.   Facility-Administered  Medications Ordered in Other Visits  Medication Dose Route Frequency Provider Last Rate Last Dose  . heparin lock flush 100 unit/mL  500 Units Intracatheter Once PRN Lowella Dell, MD      . sodium chloride 0.9 % injection 10 mL  10 mL Intracatheter PRN Lowella Dell, MD      . trastuzumab (HERCEPTIN) 756 mg in sodium chloride 0.9 % 250 mL chemo infusion  6 mg/kg (Treatment Plan Actual) Intravenous Once Lowella Dell, MD 572 mL/hr at 02/26/13 1550 756 mg at 02/26/13 1550    OBJECTIVE:  Middle aged [place] American female in no acute distress. Filed Vitals:   02/26/13 1422  BP: 130/88  Pulse: 73  Temp: 97.9 F (36.6 C)  Resp: 20     Body mass index is 49.33 kg/(m^2).    ECOG FS: 1 Filed Weights   02/26/13 1422  Weight: 278 lb 6.4 oz (126.281 kg)   Filed Weights   02/26/13 1422  Weight: 278 lb 6.4 oz (126.281 kg)   Physical Exam: HEENT:  Sclerae anicteric.  Oropharynx clear.  Nodes: There is a small deep palpable lymph node in the right cervical chain, less than 1 cm by palpation. Otherwise no additional cervical or supraclavicular lymphadenopathy palpated.  Breast Exam: Patient is status post bilateral mastectomies. She is somewhat tender to touch on exam, especially in the areas with significant scar tissue. No axillary adenopathy on the right or the left. Lungs:  Clear to auscultation bilaterally. No rhonchi or wheezes.  Heart:  Regular rate and rhythm. No murmur appreciated  PICC line is present in the right upper chest wall no evidence of drainage or infection. Abdomen:  Soft, nontender.  Positive bowel sounds.  Musculoskeletal:  No focal spinal tenderness to palpation. There is tenderness to palpation of the right shoulder, and range of motion is somewhat limited secondary to discomfort. Extremities:  No peripheral edema  Neuro:  Nonfocal, well oriented    LAB RESULTS: Lab Results  Component Value Date   WBC 6.1 02/26/2013   NEUTROABS 3.3 02/26/2013   HGB 11.8  02/26/2013   HCT 33.6* 02/26/2013   MCV 76.5* 02/26/2013   PLT 220 02/26/2013      Chemistry      Component Value Date/Time   NA 141 01/30/2013 0850   NA 139 04/11/2012 0837   K 3.9 01/30/2013 0850   K 3.8 04/11/2012 0837   CL 104 01/30/2013 0850   CL 101 04/11/2012 0837   CO2 28 01/30/2013 0850   CO2 27 04/11/2012 0837   BUN 13.4 01/30/2013 0850   BUN 25* 04/11/2012 0837   CREATININE 0.8 01/30/2013 0850   CREATININE 1.12* 04/11/2012 0837      Component Value Date/Time   CALCIUM 9.2 01/30/2013 0850   CALCIUM 8.7 04/11/2012 0837   ALKPHOS 89 01/30/2013 0850   ALKPHOS 87 04/11/2012 0837   AST 13 01/30/2013 0850   AST 19 04/11/2012 0837   ALT 12 01/30/2013 0850   ALT 14 04/11/2012 0837   BILITOT 0.53 01/30/2013 0850   BILITOT 0.8 04/11/2012 0837       Lab Results  Component Value Date   LABCA2 22 08/15/2012   STUDIES:  Nm Pet Image Restag (ps) Skull Base To Thigh  02/23/2013   *RADIOLOGY REPORT*  Clinical Data: Subsequent treatment strategy for metastatic breast carcinoma.  NUCLEAR MEDICINE PET SKULL BASE TO THIGH  Fasting Blood Glucose:  87  Technique:  15.4 mCi F-18 FDG was injected intravenously. CT data was obtained and used for attenuation correction and anatomic localization only.  (This was not acquired as a diagnostic CT examination.) Additional exam technical data entered on technologist worksheet.  Comparison:  05/21/2012  Findings:  Neck: Shotty less than 1 cm right level II upper internal jugular lymph nodes are seen which show new hypermetabolic activity on today's study, with maximum SUV measuring 5.2. No other hypermetabolic lymph nodes identified within the neck  Chest:  No hypermetabolic mediastinal or hilar nodes.  No suspicious pulmonary nodules on the CT scan.  Postop changes from bilateral mastectomies and axillary lymph node dissections are stable in appearance.  Abdomen/Pelvis:  No abnormal hypermetabolic activity within the liver, pancreas, adrenal glands, or spleen.  No hypermetabolic lymph  nodes in the abdomen or pelvis. Calcified mass in the right pelvis with hypermetabolic activity is stable, and most likely represents a subserosal fibroid.  Skeleton:  No focal hypermetabolic activity to suggest skeletal metastasis.  IMPRESSION:  1. Mild new hypermetabolic activity within small less than 1 cm right level II cervical lymph nodes, which is nonspecific. Continued imaging followup by PET or CT is suggested given the sub centimeter size of these lymph nodes. 2.  No other significant interval change or signs of metastatic disease. 3.  Stable probable calcified fibroid in the right pelvis.   Original Report Authenticated By: Myles Rosenthal, M.D.     ASSESSMENT: 62 y.o.  [place], [place], woman  (1)  with a history of inflammatory right breast cancer metastatic at presentation September 2004 with involvement of liver and bone, HER-2 positive, estrogen and progesterone receptor negative  (2) treated with carboplatin, docetaxel and Herceptin x6 completed April 2005  (3) trastuzumab continued indefinitely; has also received lapatinib and capecitabine for variable intervals in 2007-2008.  (4) status post bilateral mastectomies with bilateral axillary lymph node dissection 12/07/2004, showing  (a) on the right, a  mypT1c ypN1 invasive ductal carcinoma, grade 3, estrogen and progesterone receptor negative, HER-2 positive, with an MIB-1 of 31%  (b) on the left, ypT2 ypN1 invasive ductal carcinoma, grade 2, estrogen and progesterone receptor negative, HER-2 positive, with an MIB-1 of 35%.  (5)  Status post radiation June through July of 2006, to the right chest wall, left chest wall, bilateral supraclavicular fossae, and bilateral axillary boosts; with additional radiation to the right and left chest walls and the central chest wall completed November of 2007  (6) status post ixempra x9 completed August of 2009.  (7) history of superior vena caval syndrome, on life long anticoagulation    (8)  History of chemotherapy-induced neuropathy. On Neurontin 600 mg p.o. T.i.d., MS Contin, and MSIR.   PLAN: I have reviewed Kalyna's recent PET scan with Dr. Darnelle Catalan, and he is pleased with the results. His thoughts are that the lymph node is inconsequential at this point, possibly even reactive, and we will simply continue to follow with her normal scans.  Edward and I reviewed her PET scan results together today. She will continue with her current regimen, will receive trastuzumab today as scheduled, and will continue to be treated every 3 weeks. We discussed the possibility of initiating zoledronic acid which she currently declines.  she'll continue to have echocardiograms every 3 months, the next of which is scheduled for 03/26/2013. We will see her for physical exam every 3 months as well, and her next office visit is scheduled with Dr. Darnelle Catalan on October 17.  They'll discuss the need for any additional restaging scans at that time.  Of course she will continue to be followed by the Coumadin clinic when she comes in for treatment every 3 weeks.  We reviewed the pain in her right shoulder and left hip. I have offered her plain film x-rays or MRIs, as well as referrals to orthopedics, all of which she has currently declined. Certainly we noted that none of these areas "lit up" on the recent PET, so that is a good sign with regards to her breast cancer.  [place] and [place] both voice understanding and agreement with our plan today. She knows as always to call with any changes or problems.     Ansley Stanwood, PA-C 02/26/2013  3:57 PM

## 2013-03-02 ENCOUNTER — Encounter: Payer: Self-pay | Admitting: Oncology

## 2013-03-05 ENCOUNTER — Other Ambulatory Visit: Payer: Self-pay | Admitting: *Deleted

## 2013-03-05 DIAGNOSIS — I82409 Acute embolism and thrombosis of unspecified deep veins of unspecified lower extremity: Secondary | ICD-10-CM

## 2013-03-05 MED ORDER — WARFARIN SODIUM 5 MG PO TABS: ORAL_TABLET | ORAL | Status: DC

## 2013-03-18 ENCOUNTER — Telehealth: Payer: Self-pay | Admitting: *Deleted

## 2013-03-18 ENCOUNTER — Other Ambulatory Visit: Payer: Self-pay | Admitting: *Deleted

## 2013-03-18 DIAGNOSIS — R509 Fever, unspecified: Secondary | ICD-10-CM

## 2013-03-18 DIAGNOSIS — C50919 Malignant neoplasm of unspecified site of unspecified female breast: Secondary | ICD-10-CM

## 2013-03-18 NOTE — Telephone Encounter (Signed)
Patient calling in to report that for past week she has been having late afternoon fevers ranging from 100.4-102.8(last week while on vacation). Denies any urine frequency, cough or congestion. Only complaint is runny nose, of clear consistency.  Per Dr Darnelle Catalan, we will order Blood cultures from peripheral and PAC and urine cultures. Patient may continue to take Tylenol for fever>101.0, but to call if Tylenol unsuccessful. Patient verbalized understanding.

## 2013-03-20 ENCOUNTER — Ambulatory Visit (HOSPITAL_BASED_OUTPATIENT_CLINIC_OR_DEPARTMENT_OTHER): Payer: Medicare Other

## 2013-03-20 ENCOUNTER — Other Ambulatory Visit (HOSPITAL_BASED_OUTPATIENT_CLINIC_OR_DEPARTMENT_OTHER): Payer: Medicare Other | Admitting: Lab

## 2013-03-20 ENCOUNTER — Ambulatory Visit (HOSPITAL_BASED_OUTPATIENT_CLINIC_OR_DEPARTMENT_OTHER): Payer: Medicare Other | Admitting: Pharmacist

## 2013-03-20 VITALS — BP 132/79 | HR 71 | Temp 98.8°F | Resp 20

## 2013-03-20 DIAGNOSIS — C7951 Secondary malignant neoplasm of bone: Secondary | ICD-10-CM

## 2013-03-20 DIAGNOSIS — I82409 Acute embolism and thrombosis of unspecified deep veins of unspecified lower extremity: Secondary | ICD-10-CM

## 2013-03-20 DIAGNOSIS — Z5112 Encounter for antineoplastic immunotherapy: Secondary | ICD-10-CM

## 2013-03-20 DIAGNOSIS — C50919 Malignant neoplasm of unspecified site of unspecified female breast: Secondary | ICD-10-CM

## 2013-03-20 DIAGNOSIS — Z5111 Encounter for antineoplastic chemotherapy: Secondary | ICD-10-CM

## 2013-03-20 DIAGNOSIS — C7952 Secondary malignant neoplasm of bone marrow: Secondary | ICD-10-CM

## 2013-03-20 DIAGNOSIS — I871 Compression of vein: Secondary | ICD-10-CM

## 2013-03-20 DIAGNOSIS — Z7901 Long term (current) use of anticoagulants: Secondary | ICD-10-CM

## 2013-03-20 DIAGNOSIS — C787 Secondary malignant neoplasm of liver and intrahepatic bile duct: Secondary | ICD-10-CM

## 2013-03-20 DIAGNOSIS — C50912 Malignant neoplasm of unspecified site of left female breast: Secondary | ICD-10-CM

## 2013-03-20 DIAGNOSIS — R509 Fever, unspecified: Secondary | ICD-10-CM

## 2013-03-20 LAB — URINALYSIS, MICROSCOPIC - CHCC
Bilirubin (Urine): NEGATIVE
Glucose: NEGATIVE mg/dL
Nitrite: NEGATIVE
Urobilinogen, UR: 0.2 mg/dL (ref 0.2–1)

## 2013-03-20 LAB — CBC WITH DIFFERENTIAL/PLATELET
Eosinophils Absolute: 0.1 10*3/uL (ref 0.0–0.5)
LYMPH%: 35.8 % (ref 14.0–49.7)
MONO#: 0.6 10*3/uL (ref 0.1–0.9)
NEUT#: 4.8 10*3/uL (ref 1.5–6.5)
Platelets: 301 10*3/uL (ref 145–400)
RBC: 4.15 10*6/uL (ref 3.70–5.45)
RDW: 15.8 % — ABNORMAL HIGH (ref 11.2–14.5)
WBC: 8.5 10*3/uL (ref 3.9–10.3)
lymph#: 3 10*3/uL (ref 0.9–3.3)

## 2013-03-20 LAB — COMPREHENSIVE METABOLIC PANEL (CC13)
Albumin: 3.2 g/dL — ABNORMAL LOW (ref 3.5–5.0)
BUN: 13.8 mg/dL (ref 7.0–26.0)
Calcium: 9.4 mg/dL (ref 8.4–10.4)
Chloride: 108 mEq/L (ref 98–109)
Creatinine: 0.8 mg/dL (ref 0.6–1.1)
Glucose: 104 mg/dl (ref 70–140)
Potassium: 3.9 mEq/L (ref 3.5–5.1)

## 2013-03-20 LAB — POCT INR: INR: 2.7

## 2013-03-20 LAB — PROTIME-INR
INR: 2.7 (ref 2.00–3.50)
Protime: 32.4 Seconds — ABNORMAL HIGH (ref 10.6–13.4)

## 2013-03-20 MED ORDER — DIPHENHYDRAMINE HCL 25 MG PO CAPS
50.0000 mg | ORAL_CAPSULE | Freq: Once | ORAL | Status: AC
  Administered 2013-03-20: 50 mg via ORAL

## 2013-03-20 MED ORDER — TRASTUZUMAB CHEMO INJECTION 440 MG
6.0000 mg/kg | Freq: Once | INTRAVENOUS | Status: AC
  Administered 2013-03-20: 756 mg via INTRAVENOUS
  Filled 2013-03-20: qty 36

## 2013-03-20 MED ORDER — HEPARIN SOD (PORK) LOCK FLUSH 100 UNIT/ML IV SOLN
250.0000 [IU] | Freq: Once | INTRAVENOUS | Status: AC | PRN
  Administered 2013-03-20: 250 [IU]
  Filled 2013-03-20: qty 5

## 2013-03-20 MED ORDER — SODIUM CHLORIDE 0.9 % IJ SOLN
10.0000 mL | INTRAMUSCULAR | Status: DC | PRN
  Administered 2013-03-20: 10 mL
  Filled 2013-03-20: qty 10

## 2013-03-20 MED ORDER — SODIUM CHLORIDE 0.9 % IV SOLN
Freq: Once | INTRAVENOUS | Status: AC
Start: 2013-03-20 — End: 2013-03-20
  Administered 2013-03-20: 09:00:00 via INTRAVENOUS

## 2013-03-20 MED ORDER — ACETAMINOPHEN 325 MG PO TABS
650.0000 mg | ORAL_TABLET | Freq: Once | ORAL | Status: AC
  Administered 2013-03-20: 650 mg via ORAL

## 2013-03-20 MED ORDER — LORAZEPAM 2 MG/ML IJ SOLN
1.0000 mg | Freq: Once | INTRAMUSCULAR | Status: AC | PRN
  Administered 2013-03-20: 1 mg via INTRAVENOUS

## 2013-03-20 NOTE — Patient Instructions (Signed)

## 2013-03-20 NOTE — Progress Notes (Signed)
INR is at goal today. Patient is here for herceptin infusion. Pt reports no bleeding/bruising or missed/extra doses. She is feeling well today and is afebrile but states she has not been feeling well this week as well as running low grade fevers requiring tylenol. Patient did not take any tylenol today. Since INR is at goal today no changes to her coumadin regimen will be made. She will continue same dose of 5 mg daily except for 7.5 mg on Mondays and Wednesdays. We will see her back in the infusion area in 3 weeks with her next appointment on 04/10/13

## 2013-03-20 NOTE — Patient Instructions (Addendum)
INR at goal  Glad you are feeling better!!  No Changes  Continue coumadin 5mg  daily except 7.5mg  on M&W.  Recheck INR on 04/10/13.  We will see you in infusion. (lab at 8:15 and infusion at 8:45am)

## 2013-03-21 ENCOUNTER — Inpatient Hospital Stay (HOSPITAL_COMMUNITY)
Admission: EM | Admit: 2013-03-21 | Discharge: 2013-03-25 | DRG: 315 | Disposition: A | Discharge status: Home Health | Payer: Medicare Other | Attending: Internal Medicine | Admitting: Internal Medicine

## 2013-03-21 ENCOUNTER — Encounter (HOSPITAL_COMMUNITY): Payer: Self-pay | Admitting: Emergency Medicine

## 2013-03-21 ENCOUNTER — Emergency Department (HOSPITAL_COMMUNITY): Payer: Medicare Other

## 2013-03-21 ENCOUNTER — Other Ambulatory Visit: Payer: Self-pay | Admitting: Hematology & Oncology

## 2013-03-21 DIAGNOSIS — K5900 Constipation, unspecified: Secondary | ICD-10-CM | POA: Diagnosis present

## 2013-03-21 DIAGNOSIS — F411 Generalized anxiety disorder: Secondary | ICD-10-CM | POA: Diagnosis present

## 2013-03-21 DIAGNOSIS — F419 Anxiety disorder, unspecified: Secondary | ICD-10-CM

## 2013-03-21 DIAGNOSIS — D630 Anemia in neoplastic disease: Secondary | ICD-10-CM | POA: Diagnosis present

## 2013-03-21 DIAGNOSIS — I871 Compression of vein: Secondary | ICD-10-CM | POA: Diagnosis present

## 2013-03-21 DIAGNOSIS — I10 Essential (primary) hypertension: Secondary | ICD-10-CM | POA: Diagnosis present

## 2013-03-21 DIAGNOSIS — C787 Secondary malignant neoplasm of liver and intrahepatic bile duct: Secondary | ICD-10-CM | POA: Diagnosis present

## 2013-03-21 DIAGNOSIS — Z880 Allergy status to penicillin: Secondary | ICD-10-CM

## 2013-03-21 DIAGNOSIS — Z923 Personal history of irradiation: Secondary | ICD-10-CM

## 2013-03-21 DIAGNOSIS — T80211A Bloodstream infection due to central venous catheter, initial encounter: Secondary | ICD-10-CM

## 2013-03-21 DIAGNOSIS — Z7901 Long term (current) use of anticoagulants: Secondary | ICD-10-CM

## 2013-03-21 DIAGNOSIS — A419 Sepsis, unspecified organism: Secondary | ICD-10-CM

## 2013-03-21 DIAGNOSIS — Z9010 Acquired absence of unspecified breast and nipple: Secondary | ICD-10-CM

## 2013-03-21 DIAGNOSIS — Y849 Medical procedure, unspecified as the cause of abnormal reaction of the patient, or of later complication, without mention of misadventure at the time of the procedure: Secondary | ICD-10-CM | POA: Diagnosis present

## 2013-03-21 DIAGNOSIS — T80218A Other infection due to central venous catheter, initial encounter: Secondary | ICD-10-CM

## 2013-03-21 DIAGNOSIS — C7800 Secondary malignant neoplasm of unspecified lung: Secondary | ICD-10-CM | POA: Diagnosis present

## 2013-03-21 DIAGNOSIS — C7951 Secondary malignant neoplasm of bone: Secondary | ICD-10-CM | POA: Diagnosis present

## 2013-03-21 DIAGNOSIS — A4150 Gram-negative sepsis, unspecified: Secondary | ICD-10-CM

## 2013-03-21 DIAGNOSIS — I82210 Acute embolism and thrombosis of superior vena cava: Secondary | ICD-10-CM | POA: Diagnosis present

## 2013-03-21 DIAGNOSIS — R509 Fever, unspecified: Secondary | ICD-10-CM

## 2013-03-21 DIAGNOSIS — B9689 Other specified bacterial agents as the cause of diseases classified elsewhere: Secondary | ICD-10-CM | POA: Diagnosis present

## 2013-03-21 DIAGNOSIS — E669 Obesity, unspecified: Secondary | ICD-10-CM | POA: Diagnosis present

## 2013-03-21 DIAGNOSIS — G589 Mononeuropathy, unspecified: Secondary | ICD-10-CM | POA: Diagnosis present

## 2013-03-21 DIAGNOSIS — C50911 Malignant neoplasm of unspecified site of right female breast: Secondary | ICD-10-CM

## 2013-03-21 DIAGNOSIS — Z6841 Body Mass Index (BMI) 40.0 and over, adult: Secondary | ICD-10-CM

## 2013-03-21 DIAGNOSIS — I82409 Acute embolism and thrombosis of unspecified deep veins of unspecified lower extremity: Secondary | ICD-10-CM | POA: Diagnosis present

## 2013-03-21 DIAGNOSIS — C50919 Malignant neoplasm of unspecified site of unspecified female breast: Secondary | ICD-10-CM | POA: Diagnosis present

## 2013-03-21 DIAGNOSIS — D638 Anemia in other chronic diseases classified elsewhere: Secondary | ICD-10-CM | POA: Diagnosis present

## 2013-03-21 DIAGNOSIS — R7881 Bacteremia: Secondary | ICD-10-CM | POA: Diagnosis present

## 2013-03-21 DIAGNOSIS — Z9221 Personal history of antineoplastic chemotherapy: Secondary | ICD-10-CM

## 2013-03-21 LAB — CBC
HCT: 29.3 % — ABNORMAL LOW (ref 36.0–46.0)
MCHC: 34.8 g/dL (ref 30.0–36.0)
MCV: 76.3 fL — ABNORMAL LOW (ref 78.0–100.0)
RDW: 15.5 % (ref 11.5–15.5)

## 2013-03-21 LAB — BASIC METABOLIC PANEL
BUN: 11 mg/dL (ref 6–23)
CO2: 25 mEq/L (ref 19–32)
Chloride: 103 mEq/L (ref 96–112)
Creatinine, Ser: 0.82 mg/dL (ref 0.50–1.10)
GFR calc Af Amer: 88 mL/min — ABNORMAL LOW (ref >90)
GFR calc non Af Amer: 76 mL/min — ABNORMAL LOW (ref >90)
Sodium: 138 mEq/L (ref 135–145)

## 2013-03-21 LAB — CBC WITH DIFFERENTIAL/PLATELET
Basophils Absolute: 0 10*3/uL (ref 0.0–0.1)
HCT: 31.6 % — ABNORMAL LOW (ref 36.0–46.0)
Lymphocytes Relative: 39 % (ref 12–46)
Neutro Abs: 4.2 10*3/uL (ref 1.7–7.7)
Platelets: 279 10*3/uL (ref 150–400)
RBC: 4.17 MIL/uL (ref 3.87–5.11)
RDW: 15.5 % (ref 11.5–15.5)
WBC: 8.2 10*3/uL (ref 4.0–10.5)

## 2013-03-21 MED ORDER — WARFARIN SODIUM 7.5 MG PO TABS
7.5000 mg | ORAL_TABLET | ORAL | Status: DC
  Filled 2013-03-21: qty 1

## 2013-03-21 MED ORDER — LOSARTAN POTASSIUM 50 MG PO TABS
100.0000 mg | ORAL_TABLET | Freq: Every day | ORAL | Status: DC
  Administered 2013-03-21 – 2013-03-25 (×5): 100 mg via ORAL
  Filled 2013-03-21 (×5): qty 2

## 2013-03-21 MED ORDER — MORPHINE SULFATE 15 MG PO TABS
15.0000 mg | ORAL_TABLET | ORAL | Status: DC | PRN
Start: 2013-03-21 — End: 2013-03-25

## 2013-03-21 MED ORDER — WARFARIN - PHARMACIST DOSING INPATIENT
Freq: Every day | Status: DC
  Administered 2013-03-22: 18:00:00

## 2013-03-21 MED ORDER — DICLOFENAC SODIUM 1 % TD GEL
2.0000 g | Freq: Four times a day (QID) | TRANSDERMAL | Status: DC | PRN
  Filled 2013-03-21: qty 100

## 2013-03-21 MED ORDER — SODIUM CHLORIDE 0.9 % IJ SOLN
3.0000 mL | Freq: Two times a day (BID) | INTRAMUSCULAR | Status: DC
  Administered 2013-03-22 – 2013-03-25 (×4): 3 mL via INTRAVENOUS

## 2013-03-21 MED ORDER — ZOLPIDEM TARTRATE 10 MG PO TABS: 10.0000 mg | ORAL_TABLET | Freq: Every evening | ORAL | Status: DC | PRN

## 2013-03-21 MED ORDER — SPIRONOLACTONE 12.5 MG HALF TABLET
12.5000 mg | ORAL_TABLET | Freq: Every morning | ORAL | Status: DC
  Administered 2013-03-21 – 2013-03-25 (×5): 12.5 mg via ORAL
  Filled 2013-03-21 (×5): qty 1

## 2013-03-21 MED ORDER — ACETAMINOPHEN 500 MG PO TABS
500.0000 mg | ORAL_TABLET | Freq: Four times a day (QID) | ORAL | Status: DC | PRN
  Administered 2013-03-21: 500 mg via ORAL
  Filled 2013-03-21: qty 1

## 2013-03-21 MED ORDER — ALPRAZOLAM 1 MG PO TABS: 1.0000 mg | ORAL_TABLET | Freq: Three times a day (TID) | ORAL | Status: DC | PRN

## 2013-03-21 MED ORDER — WARFARIN SODIUM 5 MG PO TABS
5.0000 mg | ORAL_TABLET | ORAL | Status: DC
  Administered 2013-03-21 – 2013-03-22 (×2): 5 mg via ORAL
  Filled 2013-03-21 (×2): qty 1

## 2013-03-21 MED ORDER — MORPHINE SULFATE ER 60 MG PO TBCR
60.0000 mg | EXTENDED_RELEASE_TABLET | Freq: Two times a day (BID) | ORAL | Status: DC
  Administered 2013-03-21 – 2013-03-25 (×9): 60 mg via ORAL
  Filled 2013-03-21 (×9): qty 1

## 2013-03-21 MED ORDER — DIPHENHYDRAMINE HCL 25 MG PO CAPS: 25.0000 mg | ORAL_CAPSULE | Freq: Three times a day (TID) | ORAL | Status: DC | PRN

## 2013-03-21 MED ORDER — ZOLPIDEM TARTRATE 5 MG PO TABS
5.0000 mg | ORAL_TABLET | Freq: Every evening | ORAL | Status: DC | PRN
  Administered 2013-03-21 – 2013-03-24 (×4): 5 mg via ORAL
  Filled 2013-03-21 (×4): qty 1

## 2013-03-21 MED ORDER — FUROSEMIDE 40 MG PO TABS
40.0000 mg | ORAL_TABLET | Freq: Every day | ORAL | Status: DC | PRN
  Filled 2013-03-21: qty 1

## 2013-03-21 MED ORDER — POTASSIUM CHLORIDE CRYS ER 10 MEQ PO TBCR
10.0000 meq | EXTENDED_RELEASE_TABLET | Freq: Two times a day (BID) | ORAL | Status: DC
  Administered 2013-03-21 – 2013-03-25 (×9): 10 meq via ORAL
  Filled 2013-03-21 (×10): qty 1

## 2013-03-21 MED ORDER — AMLODIPINE BESYLATE 10 MG PO TABS
10.0000 mg | ORAL_TABLET | Freq: Every morning | ORAL | Status: DC
  Administered 2013-03-21 – 2013-03-25 (×5): 10 mg via ORAL
  Filled 2013-03-21 (×5): qty 1

## 2013-03-21 MED ORDER — DEXTROSE 5 % IV SOLN
2.0000 g | Freq: Three times a day (TID) | INTRAVENOUS | Status: DC
  Administered 2013-03-21 – 2013-03-22 (×4): 2 g via INTRAVENOUS
  Filled 2013-03-21 (×7): qty 2

## 2013-03-21 NOTE — Progress Notes (Signed)
[facility] PROGRESS NOTE  [name] ZOX:[id number] DOB: May 22, 1951 DOA: 03/21/2013 PCP: Philemon Kingdom, MD  Brief narrative: 62 year old female with past medical history of  inflammatory right breast cancer with bone and liver metastases, has received chemotherapy, now on herceptin , status post bilateral mastectomies with bilateral axillary lymph node dissection 12/07/2004 , status post radiation therapies to different area on chest wall  finally completed in 2007, further history of SVC syndrome on anticoagulation with coumadin who presented to [facility] 03/20/2013 after she was found to have blood cultures positive for an infection (drawn in oncology office). Patient did report fevers at home but has not had issues with the port-a-cath in past few years. Patient was started on cefepime on admission. CXR showed no acute cardiopulmonary process. WBC count is WNL. Hemoglobin on admission was 10.9. INR was 2.7 on admission, therapeutic.   Assessment/Plan:  Principal Problem:   *CRBSI (catheter-related bloodstream infection) - continue cefepime for now - appreciate ID consult and their recommendations - follow up blood cultures drawn on this admission.   *Gram-negative bacteremia - follow up blood culture results from this admission - continue cefepime Active Problems:   Hypertension - continue Norvasc and losartan   Anxiety - continue ativan 1 mg PO TID PRN   DVT (deep venous thrombosis), SVC syndrome - on coumadin - INR therapeutic   Breast cancer metastasized to multiple sites - management per oncology - received herceptin 03/20/2013   Anemia of chronic disease - secondary to history of malignancy - hemoglobin stable at 10.2 - no indications for transfusion  Code Status: full code Family Communication: updated the family at the bedside Disposition Plan: home when stable  Manson Passey, MD  [facility] Pager [phone]  If 7PM-7AM, please contact  night-coverage [email or web address] Password [email or web address] 03/21/2013, 6:42 AM   LOS: 0 days   Consultants:  Infectious disease  Procedures:  None   Antibiotics:  Cefepime 03/21/2013 -->  HPI/Subjective: No acute overnight events.  Objective: Filed Vitals:   03/21/13 0207  BP: 139/95  Pulse: 83  Temp: 98.8 F (37.1 C)  TempSrc: Oral  Resp: 22  Height: 5\' 3"  (1.6 m)  Weight: 124.512 kg (274 lb 8 oz)  SpO2: 96%   No intake or output data in the 24 hours ending 03/21/13 8295  Exam:   General:  Pt is alert, follows commands appropriately, not in acute distress  Cardiovascular: Regular rate and rhythm, S1/S2, no murmurs, no rubs, no gallops  Skin: port-a-cath in right upper chest with no surrounding erythema or other stigmata of infection  Respiratory: Clear to auscultation bilaterally, no wheezing, no crackles, no rhonchi  Abdomen: Soft, non tender, non distended, bowel sounds present, no guarding  Extremities: No edema, pulses DP and PT palpable bilaterally  Neuro: Grossly nonfocal  Data Reviewed: Basic Metabolic Panel:  Recent Labs Lab 03/20/13 0821 03/21/13 0310  NA 141 138  K 3.9 3.5  CL  --  103  CO2 22 25  GLUCOSE 104 97  BUN 13.8 12  CREATININE 0.8 0.85  CALCIUM 9.4 9.7   Liver Function Tests:  Recent Labs Lab 03/20/13 0821  AST 10  ALT 12  ALKPHOS 75  BILITOT 0.53  PROT 8.3  ALBUMIN 3.2*   No results found for this basename: LIPASE, AMYLASE,  in the last 168 hours No results found for this basename: AMMONIA,  in the last 168 hours CBC:  Recent Labs Lab 03/20/13 0819 03/21/13 0310  WBC 8.5 8.2  NEUTROABS 4.8 4.2  HGB 11.1* 10.9*  HCT 32.0* 31.6*  MCV 77.1* 75.8*  PLT 301 279   Cardiac Enzymes: No results found for this basename: CKTOTAL, CKMB, CKMBINDEX, TROPONINI,  in the last 168 hours BNP: No components found with this basename: POCBNP,  CBG: No results found for this basename: GLUCAP,  in the last 168 hours  No results found  for this or any previous visit (from the past 240 hour(s)).   Studies: Dg Chest Port 1 View 03/21/2013   IMPRESSION: No focal airspace consolidation to suggest acute infectious pneumonitis.   Original Report Authenticated By: Rise Mu, M.D.    Scheduled Meds: . amLODipine  10 mg Oral q morning - 10a  . ceFEPime (MAXIPIME) IV  2 g Intravenous Q8H  . losartan  100 mg Oral Daily  . morphine  60 mg Oral BID  . potassium chloride  10 mEq Oral BID  . spironolactone  12.5 mg Oral q morning - 10a  . warfarin  5 mg Oral Custom

## 2013-03-21 NOTE — ED Notes (Signed)
RECEIVED BED ASSIGNMENT@0544 

## 2013-03-21 NOTE — ED Notes (Signed)
Pt states she has had cancer since 2004 and has continued to have treatments,  Pt has picc in chest,  States past ports have become infected and they were removed,  Pt states picc not in right arm due to lymphodema

## 2013-03-21 NOTE — Progress Notes (Signed)
ANTICOAGULATION CONSULT NOTE - Initial Consult  Pharmacy Consult for warfarin Indication: DVT (SVC syndrome)  Allergies  Allergen Reactions  . Adhesive (Tape) Other (See Comments)    Tears skin   . Penicillins Hives    Patient Measurements: Height: 5\' 3"  (160 cm) Weight: 274 lb 8 oz (124.512 kg) IBW/kg (Calculated) : 52.4 Heparin Dosing Weight:   Vital Signs: Temp: 98.8 F (37.1 C) (07/26 0207) Temp src: Oral (07/26 0207) BP: 139/95 mmHg (07/26 0207) Pulse Rate: 83 (07/26 0207)  Labs:  Recent Labs  03/20/13 0819 03/20/13 0821 03/20/13 0900 03/21/13 0310  HGB 11.1*  --   --  10.9*  HCT 32.0*  --   --  31.6*  PLT 301  --   --  279  INR 2.70  --  2.7  --   CREATININE  --  0.8  --  0.85    Estimated Creatinine Clearance: 89.1 ml/min (by C-G formula based on Cr of 0.85).   Medical History: Past Medical History  Diagnosis Date  . Breast cancer     mets to liver and lung  . Hypertension   . SVC syndrome   . History of chemotherapy Feb. 2006    taxotere/herceptin/carboplatin  . Radiation 07/31/2006    left upper chest  . Radiation 06/17/2006-06/27/2006    6480 cGy bilat. chest wall  . Neuropathy   . Thrombosis   . Breast cancer metastasized to multiple sites 02/26/2013    Medications:   (Not in a hospital admission)  Assessment: Patient on chronic warfarin for DVT/SVC syndrome.  INR at goal on admit.  Goal of Therapy:  INR 2-3    Plan:  Start with home dosing of warfarin tonight. Check PT/INR daily.    Darlina Guys, Jacquenette Shone Crowford 03/21/2013,5:23 AM

## 2013-03-21 NOTE — ED Notes (Addendum)
Pt sent to ED by Dr. Elray Mcgregor for report of Gram negavite rods in blood cultures. Pt should be admitted by hospitalist. Pt is CA pt, pt received chemo tx today.

## 2013-03-21 NOTE — ED Notes (Signed)
Awaiting IV team to access picc

## 2013-03-21 NOTE — ED Provider Notes (Signed)
CSN: [id number]     Arrival date & time 03/21/13  0200 History     First MD Initiated Contact with Patient 03/21/13 0234     Chief Complaint  Patient presents with  . Blood Infection   (Consider location/radiation/quality/duration/timing/severity/associated sxs/prior Treatment) HPI Comments:  Is a 62 year old female with a history of breast cancer with metastases to liver and lung, currently undergoing chemotherapy q 3 weeks, who presents per request of Dr. Darnelle Catalan for gram negative rods in her blood cultures drawn yesterday. Patient states she has been having clear rhinorrhea beginning 3 weeks ago with associated fevers of Tmax 102.8. Patient states fevers have been responding to tylenol. Admits to being febrile two days this week. Patient denies chest pain, N/V, diarrhea, melena, hematochezia, and dysuria. Patient well and nontoxic appearing on arrival; afebrile in triage.  Oncologist - Dr. Darnelle Catalan  The history is provided by the patient. No language interpreter was used.    Past Medical History  Diagnosis Date  . Breast cancer     mets to liver and lung  . Hypertension   . SVC syndrome   . History of chemotherapy Feb. 2006    taxotere/herceptin/carboplatin  . Radiation 07/31/2006    left upper chest  . Radiation 06/17/2006-06/27/2006    6480 cGy bilat. chest wall  . Neuropathy   . Thrombosis   . Breast cancer metastasized to multiple sites 02/26/2013   Past Surgical History  Procedure Laterality Date  . Tubal ligation  1986  . Cholecystectomy  1989  . Mastectomy Bilateral   . Ankle surgery    . Peripherally inserted central catheter insertion    . Back surgery     Family History  Problem Relation Age of Onset  . Heart failure Father   . Cancer Father     Prostate cancer  . Heart failure Brother   . Cancer Brother     Prostate cancer  . Diabetes Maternal Aunt    History  Substance Use Topics  . Smoking status: Never Smoker   . Smokeless tobacco: Never Used  .  Alcohol Use: Yes     Comment: occasional   OB History   Grav Para Term Preterm Abortions TAB SAB Ect Mult Living                 Review of Systems  Constitutional: Positive for fever, chills and diaphoresis.  HENT: Positive for rhinorrhea. Negative for neck pain and neck stiffness.   Respiratory: Negative for shortness of breath.   Gastrointestinal: Negative for nausea and vomiting.  Genitourinary: Negative for dysuria.  All other systems reviewed and are negative.    Allergies  Adhesive and Penicillins  Home Medications   Current Outpatient Rx  Name  Route  Sig  Dispense  Refill  . acetaminophen (TYLENOL) 500 MG tablet   Oral   Take 500 mg by mouth every 6 (six) hours as needed for pain or fever.         . ALPRAZolam (XANAX) 1 MG tablet   Oral   Take 1 tablet (1 mg total) by mouth 3 (three) times daily as needed for anxiety.   90 tablet   1   . amLODipine (NORVASC) 10 MG tablet   Oral   Take 10 mg by mouth every morning.         . diphenhydrAMINE (BENADRYL) 25 MG tablet   Oral   Take 25 mg by mouth every 8 (eight) hours as needed for allergies.         Marland Kitchen  furosemide (LASIX) 80 MG tablet   Oral   Take 40 mg by mouth daily as needed for fluid or edema.         Marland Kitchen losartan (COZAAR) 100 MG tablet   Oral   Take 100 mg by mouth every morning.          Marland Kitchen morphine (MS CONTIN) 60 MG 12 hr tablet   Oral   Take 1 tablet (60 mg total) by mouth 2 (two) times daily.   60 tablet   0   . morphine (MSIR) 15 MG tablet   Oral   Take 15 mg by mouth every 4 (four) hours as needed for pain.         . potassium chloride (MICRO-K) 10 MEQ CR capsule   Oral   Take 1 capsule (10 mEq total) by mouth 2 (two) times daily.   30 capsule   3   . spironolactone (ALDACTONE) 25 MG tablet   Oral   Take 12.5 mg by mouth every morning.         Marland Kitchen VOLTAREN 1 % GEL   Oral   Take 2-4 g by mouth 4 (four) times daily as needed (pain).          Marland Kitchen warfarin (COUMADIN) 2.5 MG  tablet   Oral   Take 5-7.5 mg by mouth every evening. Take 7.5mg  on Monday and Wednesday. Take 5mg  each other day of the week         . zolpidem (AMBIEN) 10 MG tablet   Oral   Take 10 mg by mouth at bedtime as needed for sleep.           BP 139/95  Pulse 83  Temp(Src) 98.8 F (37.1 C) (Oral)  Resp 22  Ht 5\' 3"  (1.6 m)  Wt 274 lb 8 oz (124.512 kg)  BMI 48.64 kg/m2  SpO2 96%  Physical Exam  Nursing note and vitals reviewed. Constitutional: She is oriented to person, place, and time. She appears well-developed and well-nourished. No distress.  HENT:  Head: Normocephalic and atraumatic.  Eyes: Conjunctivae and EOM are normal. Pupils are equal, round, and reactive to light. No scleral icterus.  Neck: Normal range of motion.  Cardiovascular: Normal rate, regular rhythm and normal heart sounds.   Pulmonary/Chest: Effort normal and breath sounds normal. No respiratory distress. She has no wheezes. She has no rales.  Abdominal: Soft. There is no tenderness. There is no rebound and no guarding.  Musculoskeletal: Normal range of motion.  Neurological: She is alert and oriented to person, place, and time.  Skin: Skin is warm and dry. No rash noted. She is not diaphoretic. No erythema. No pallor.  Psychiatric: She has a normal mood and affect. Her behavior is normal.   ED Course   Procedures (including critical care time)  Labs Reviewed  CBC WITH DIFFERENTIAL - Abnormal; Notable for the following:    Hemoglobin 10.9 (*)    HCT 31.6 (*)    MCV 75.8 (*)    All other components within normal limits  CULTURE, BLOOD (ROUTINE X 2)  CULTURE, BLOOD (ROUTINE X 2)  BASIC METABOLIC PANEL   No results found.  1. Sepsis due to Gram negative bacteria   2. Breast cancer metastasized to multiple sites, right    MDM  Patient presents per request of Dr. Darnelle Catalan for positive blood cultures drawn yesterday; cultures grew gram negative rods. Physical exam as above. Have consulted pharmacy  who recommends 2g Cefipime q 8  hours in light of penicillin allergy. Patient admits to taking this in the past without problems. She is afebrile, well and nontoxic appearing, and hemodynamically stable at this time. CBC, BMP, blood cultures, lactic acid, and portable CXR ordered. Will consult hospitalist for admission.  Dr. Julian Reil to admit.  Antony Madura, PA-C 03/21/13 0329  Antony Madura, PA-C 03/21/13 [phone]

## 2013-03-21 NOTE — ED Notes (Signed)
CG4 LACTIC ACID RESULTS GIVEN TO EDP OTTER

## 2013-03-21 NOTE — Progress Notes (Signed)
ID: [name] OB: 07-17-1951  MR#: [id number]  CSN#:[id number]  PCP: PROCHNAU,CAROLINE, MD GYN:   SU:  OTHER MD:   HISTORY OF PRESENT ILLNESS: From Dr. Renelda Davis note 06/04/2003:  "This woman has been in good health all of her life. She noted a swelling and discomfort in her right breast in June of this year. She was seen in the Emergency Room in [place] and was treated for mastitis. She was treated for a number of months with mastitis and the swelling did not get better. She was given hydrocodone and Cipro. Finally, the swelling did get better and ultimately the nipple became retracted and she noticed some dimpling in her skin. She had a mammogram in July of this year in [place] with subsequent mammogram on May 27, 2003, by Dr. [name]. Mammogram done on September 30 showed marked increased density in the left breast. Biopsy was performed the same day. It was noted at the 12 o'clock position, deep in the breast was a focal hypoechoic mass, at least 3.5 cm in diameter. Biopsy did in fact show invasive in situ mammary carcinoma. This was felt to be both at least intermediate, high grade. No definite lymphovascular invasion was identified. ER and PR, Her2 testing is pending. [name] continues to have pain in her breast. She continues to take hydrocodone a number of times a day. She has been seen by Dr. Earlene Plater, who felt that neoadjuvant chemotherapy would be required.  Initial staging studies showed evidence of liver and lung mets. Patient also has evidence of bone lesions.  Patient started neoadjuvant chemotherapy, Taxotere/Carbo/Herceptin in October 2004. Patient had a CT scan in December 2004 which demonstrated extensive clot in the SVC innominate vein, bilateral jugular vein and She was started on anticoagulation therapy. She received a total of 6 cycles of Taxotere/Carbo/Herceptin, completed in April 2005." Her subsequent history is as detailed below.    INTERVAL HISTORY: Patient was out  of town when she noted increased temperatures, which she treated with tylenol and non-steroidals; eventually she called to report the temperatures and we aasked her to come by our office 7/23 for blood and urine cultures. Blood cultures are reporting GNRs, not yet identified and sensitivities pending. The pt was admitted for IV antibiotics pending definitive culture reports.  REVIEW OF SYSTEMS: Currently feels "fine," no headaches, nausea, vomiting, visual changes, cough, SOB, pleurisy, or change in bowel or bladder habits. No port ass. pain. In addition to the fevers she fell 7/20, twisted her ankle; no dizzyness, gait imbalance or LOC; went on to church. ROS currently otherwise negative. Family in room  PAST MEDICAL HISTORY: Past Medical History  Diagnosis Date  . Breast cancer     mets to liver and lung  . Hypertension   . SVC syndrome   . History of chemotherapy Feb. 2006    taxotere/herceptin/carboplatin  . Radiation 07/31/2006    left upper chest  . Radiation 06/17/2006-06/27/2006    6480 cGy bilat. chest wall  . Neuropathy   . Thrombosis   . Breast cancer metastasized to multiple sites 02/26/2013    PAST SURGICAL HISTORY: Past Surgical History  Procedure Laterality Date  . Tubal ligation  1986  . Cholecystectomy  1989  . Mastectomy Bilateral   . Ankle surgery    . Peripherally inserted central catheter insertion    . Back surgery      FAMILY HISTORY Family History  Problem Relation Age of Onset  . Heart failure Father   . Cancer Father  Prostate cancer  . Heart failure Brother   . Cancer Brother     Prostate cancer  . Diabetes Maternal Aunt   She had three brothers, one died of gunshot wound, one of complications of diabetes mellitus and one of myocardial infarction. She has no sisters. Mother died of complications of brain metastasis in 16. Father has had a myocardial infarction in 1999. No history of breast or ovarian cancer in the family.    GYNECOLOGIC  HISTORY:  Menarche at age 64. Gravida 3, para 3. First live birth at age 89. No history of breast feeding. No history of hormonal replacement therapy.   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 Her husband [name] used to work as a Psychologist, educational, but is now retired She has three children, [name] who lives in [place] and works as a Copy, [name] who lives in [name] and works as a Naval architect, and [name] who lives in [place] and also works as a Copy. The patient has 12 grandchildren and 3 great-grandchildren. She attends a [employer].     ADVANCED DIRECTIVES: not in place   HEALTH MAINTENANCE: History  Substance Use Topics  . Smoking status: Never Smoker   . Smokeless tobacco: Never Used  . Alcohol Use: Yes     Comment: occasional     Colonoscopy:  PAP:  Bone density:  Lipid panel:  Allergies  Allergen Reactions  . Adhesive (Tape) Other (See Comments)    Tears skin   . Penicillins Hives    Current Facility-Administered Medications  Medication Dose Route Frequency Provider Last Rate Last Dose  . acetaminophen (TYLENOL) tablet 500 mg  500 mg Oral Q6H PRN Hillary Bow, DO   500 mg at 03/21/13 1348  . ALPRAZolam Prudy Feeler) tablet 1 mg  1 mg Oral TID PRN Hillary Bow, DO      . amLODipine (NORVASC) tablet 10 mg  10 mg Oral q morning - 10a Hillary Bow, DO   10 mg at 03/21/13 1015  . ceFEPIme (MAXIPIME) 2 g in dextrose 5 % 50 mL IVPB  2 g Intravenous Q8H Antony Madura, PA-C 100 mL/hr at 03/21/13 1403 2 g at 03/21/13 1403  . diclofenac sodium (VOLTAREN) 1 % transdermal gel 2-4 g  2-4 g Topical QID PRN Hillary Bow, DO      . diphenhydrAMINE (BENADRYL) capsule 25 mg  25 mg Oral Q8H PRN Hillary Bow, DO      . furosemide (LASIX) tablet 40 mg  40 mg Oral Daily PRN Hillary Bow, DO      . losartan (COZAAR) tablet 100 mg  100 mg Oral Daily Hillary Bow, DO   100 mg at  03/21/13 1015  . morphine (MS CONTIN) 12 hr tablet 60 mg  60 mg Oral BID Hillary Bow, DO   60 mg at 03/21/13 1015  . morphine (MSIR) tablet 15 mg  15 mg Oral Q4H PRN Hillary Bow, DO      . potassium chloride SA (K-DUR,KLOR-CON) CR tablet 10 mEq  10 mEq Oral BID Hillary Bow, DO   10 mEq at 03/21/13 1015  . sodium chloride 0.9 % injection 3 mL  3 mL Intravenous Q12H Hillary Bow, DO      . spironolactone (ALDACTONE) tablet 12.5 mg  12.5 mg Oral q morning - 10a Hillary Bow, DO  12.5 mg at 03/21/13 1015  . warfarin (COUMADIN) tablet 5 mg  5 mg Oral Custom Olivia Mackie, MD      . Melene Muller ON 03/23/2013] warfarin (COUMADIN) tablet 7.5 mg  7.5 mg Oral Custom Olivia Mackie, MD      . Warfarin - Pharmacist Dosing Inpatient   Does not apply [id number] Olivia Mackie, MD      . zolpidem Remus Loffler) tablet 5 mg  5 mg Oral QHS PRN Hillary Bow, DO        OBJECTIVE: middle aged African American woman examined in bed Filed Vitals:   03/21/13 1400  BP: 104/61  Pulse: 72  Temp: 98.8 F (37.1 C)  Resp: 18     Body mass index is 48.44 kg/(m^2).    ECOG FS: 1  Sclerae unicteric No cervical or supraclavicular adenopathy Lungs no rales or rhonchi Heart regular rate and rhythm Abd obese, benign MSK port site not swollen or tender Neuro: non-focal, well-oriented, appropriate affect Breasts: deferred   LAB RESULTS:  CMP     Component Value Date/Time   NA 138 03/21/2013 0658   NA 141 03/20/2013 0821   K 3.6 03/21/2013 0658   K 3.9 03/20/2013 0821   CL 103 03/21/2013 0658   CL 104 01/30/2013 0850   CO2 27 03/21/2013 0658   CO2 22 03/20/2013 0821   GLUCOSE 98 03/21/2013 0658   GLUCOSE 104 03/20/2013 0821   GLUCOSE 99 01/30/2013 0850   BUN 11 03/21/2013 0658   BUN 13.8 03/20/2013 0821   CREATININE 0.82 03/21/2013 0658   CREATININE 0.8 03/20/2013 0821   CALCIUM 9.5 03/21/2013 0658   CALCIUM 9.4 03/20/2013 0821   PROT 8.3 03/20/2013 0821   PROT 7.5 04/11/2012 0837   ALBUMIN 3.2* 03/20/2013 0821    ALBUMIN 3.7 04/11/2012 0837   AST 10 03/20/2013 0821   AST 19 04/11/2012 0837   ALT 12 03/20/2013 0821   ALT 14 04/11/2012 0837   ALKPHOS 75 03/20/2013 0821   ALKPHOS 87 04/11/2012 0837   BILITOT 0.53 03/20/2013 0821   BILITOT 0.8 04/11/2012 0837   GFRNONAA 76* 03/21/2013 0658   GFRAA 88* 03/21/2013 0658    I No results found for this basename: SPEP, UPEP,  kappa and lambda light chains    Lab Results  Component Value Date   WBC 8.3 03/21/2013   NEUTROABS 4.2 03/21/2013   HGB 10.2* 03/21/2013   HCT 29.3* 03/21/2013   MCV 76.3* 03/21/2013   PLT 259 03/21/2013    @LASTCHEMISTRY @  Lab Results  Component Value Date   LABCA2 22 08/15/2012    No components found with this basename: LABCA125     Recent Labs Lab 03/21/13 0658  INR 2.72*    Urinalysis    Component Value Date/Time   COLORURINE YELLOW 05/29/2008 2113   APPEARANCEUR CLEAR 05/29/2008 2113   LABSPEC 1.020 03/20/2013 0834   LABSPEC 1.011 05/29/2008 2113   PHURINE 7.0 05/29/2008 2113   GLUCOSEU Negative 03/20/2013 0834   GLUCOSEU NEGATIVE 05/29/2008 2113   HGBUR SMALL* 05/29/2008 2113   BILIRUBINUR NEGATIVE 05/29/2008 2113   KETONESUR NEGATIVE 05/29/2008 2113   PROTEINUR NEGATIVE 05/29/2008 2113   UROBILINOGEN 0.2 03/20/2013 0834   UROBILINOGEN 0.2 05/29/2008 2113   NITRITE NEGATIVE 05/29/2008 2113   LEUKOCYTESUR TRACE* 05/29/2008 2113    STUDIES: Nm Pet Image Restag (ps) Skull Base To Thigh  02/23/2013   *RADIOLOGY REPORT*  Clinical Data: Subsequent treatment strategy for metastatic breast carcinoma.  NUCLEAR  MEDICINE PET SKULL BASE TO THIGH  Fasting Blood Glucose:  87  Technique:  15.4 mCi F-18 FDG was injected intravenously. CT data was obtained and used for attenuation correction and anatomic localization only.  (This was not acquired as a diagnostic CT examination.) Additional exam technical data entered on technologist worksheet.  Comparison:  05/21/2012  Findings:  Neck: Shotty less than 1 cm right level II upper internal  jugular lymph nodes are seen which show new hypermetabolic activity on today's study, with maximum SUV measuring 5.2. No other hypermetabolic lymph nodes identified within the neck  Chest:  No hypermetabolic mediastinal or hilar nodes.  No suspicious pulmonary nodules on the CT scan.  Postop changes from bilateral mastectomies and axillary lymph node dissections are stable in appearance.  Abdomen/Pelvis:  No abnormal hypermetabolic activity within the liver, pancreas, adrenal glands, or spleen.  No hypermetabolic lymph nodes in the abdomen or pelvis. Calcified mass in the right pelvis with hypermetabolic activity is stable, and most likely represents a subserosal fibroid.  Skeleton:  No focal hypermetabolic activity to suggest skeletal metastasis.  IMPRESSION:  1. Mild new hypermetabolic activity within small less than 1 cm right level II cervical lymph nodes, which is nonspecific. Continued imaging followup by PET or CT is suggested given the sub centimeter size of these lymph nodes. 2.  No other significant interval change or signs of metastatic disease. 3.  Stable probable calcified fibroid in the right pelvis.   Original Report Authenticated By: Myles Rosenthal, M.D.   Dg Chest Port 1 View  03/21/2013   *RADIOLOGY REPORT*  Clinical Data: Sepsis  PORTABLE CHEST - 1 VIEW  Comparison: No CT from 11/19/2011  Findings: Right IJ prior line terminates at the cavoatrial junction.  The cardiac and mediastinal silhouettes are within normal limits.  The lungs are normally inflated.  No airspace consolidation to suggest an acute infectious or aspiration pneumonitis is identified.  There is no pleural effusion, pulmonary edema, or pneumothorax.  The osseous structures are within normal limits.  IMPRESSION: No focal airspace consolidation to suggest acute infectious pneumonitis.   Original Report Authenticated By: Rise Mu, M.D.    ASSESSMENT: 62 y.o. [place], [place] woman admitted with fever, blood  cultures positive for GNRs, port in place  (1) with a history of inflammatory right breast cancer metastatic at presentation September 2004 with involvement of liver and bone, HER-2 positive, estrogen and progesterone receptor negative  (2) treated with carboplatin, docetaxel and Herceptin x6 completed April 2005  (3) trastuzumab continued indefinitely; has also received lapatinib and capecitabine for variable intervals in 2007-2008.  (4) status post bilateral mastectomies with bilateral axillary lymph node dissection 12/07/2004, showing  (a) on the right, a mypT1c ypN1 invasive ductal carcinoma, grade 3, estrogen and progesterone receptor negative, HER-2 positive, with an MIB-1 of 31%  (b) on the left, ypT2 ypN1 invasive ductal carcinoma, grade 2, estrogen and progesterone receptor negative, HER-2 positive, with an MIB-1 of 35%.  (5) Status post radiation June through July of 2006, to the right chest wall, left chest wall, bilateral supraclavicular fossae, and bilateral axillary boosts; with additional radiation to the right and left chest walls and the central chest wall completed November of 2007  (6) status post ixempra x9 completed August of 2009.  (7) history of superior vena caval syndrome, on life long anticoagulation  (8) History of chemotherapy-induced neuropathy. On Neurontin 600 mg p.o. T.i.d., MS Contin, and MSIR. (9) refuses zolendronate or other biphosphonates  PLAN: She has had  this port 3 years. Her choice is to remove it if that would "speed up the process." My concern is whether there may be difficulty replacing it because of prior scarring, etc. In any case, she understands we need to know the organism and its sensitivities before "letting her go."   Her next trastuzumab treatment is scheduled fopr 04/10/2013. It can be delayed some if needed to make sure blood infection is cleared before a new port is placed.  Appreciate your help to this patient! Will follow with  you.  Lowella Dell, MD   03/21/2013 3:13 PM

## 2013-03-21 NOTE — H&P (Signed)
[facility] History and Physical  ZYKIRIA BRUENING ZOX:[id number] DOB: 01/12/1951 DOA: 03/21/2013  Referring physician: ED PCP: Philemon Kingdom, MD  Chief Complaint: Bacteremia  HPI: [name] is a 62 y.o. female with BRCA, gets herceptin through PICC line in chest, having occasional fevers for 3 weeks (Tm 102.8), had last chemo yesterday morning with herceptin only, had blood cultures drawn at onc office.  The oncologist called at 1:30 AM and blood cultures both from picc and from peripheral are positive for GNR (they didn't tell on call oncologist which became positive first).  In the ED patient is asymptomatic and states that her fevers had been responding to tylenol.  Admits to having been febrile 2 days this week.  No SIRS criteria at this time.  Review of Systems: 12 systems reviewed and otherwise negative.  Past Medical History  Diagnosis Date  . Breast cancer     mets to liver and lung  . Hypertension   . SVC syndrome   . History of chemotherapy Feb. 2006    taxotere/herceptin/carboplatin  . Radiation 07/31/2006    left upper chest  . Radiation 06/17/2006-06/27/2006    6480 cGy bilat. chest wall  . Neuropathy   . Thrombosis   . Breast cancer metastasized to multiple sites 02/26/2013   Past Surgical History  Procedure Laterality Date  . Tubal ligation  1986  . Cholecystectomy  1989  . Mastectomy Bilateral   . Ankle surgery    . Peripherally inserted central catheter insertion    . Back surgery     Social History:  reports that she has never smoked. She has never used smokeless tobacco. She reports that  drinks alcohol. She reports that she does not use illicit drugs.   Allergies  Allergen Reactions  . Adhesive (Tape) Other (See Comments)    Tears skin   . Penicillins Hives    Family History  Problem Relation Age of Onset  . Heart failure Father   . Cancer Father     Prostate cancer  . Heart failure Brother   . Cancer Brother     Prostate cancer   . Diabetes Maternal Aunt     Prior to Admission medications   Medication Sig Start Date End Date Taking? Authorizing Provider  acetaminophen (TYLENOL) 500 MG tablet Take 500 mg by mouth every 6 (six) hours as needed for pain or fever.   Yes Historical Provider, MD  ALPRAZolam Prudy Feeler) 1 MG tablet Take 1 tablet (1 mg total) by mouth 3 (three) times daily as needed for anxiety. 02/20/13  Yes Lowella Dell, MD  amLODipine (NORVASC) 10 MG tablet Take 10 mg by mouth every morning.   Yes Historical Provider, MD  diphenhydrAMINE (BENADRYL) 25 MG tablet Take 25 mg by mouth every 8 (eight) hours as needed for allergies.   Yes Historical Provider, MD  furosemide (LASIX) 80 MG tablet Take 40 mg by mouth daily as needed for fluid or edema.   Yes Historical Provider, MD  losartan (COZAAR) 100 MG tablet Take 100 mg by mouth every morning.    Yes Historical Provider, MD  morphine (MS CONTIN) 60 MG 12 hr tablet Take 1 tablet (60 mg total) by mouth 2 (two) times daily. 02/20/13  Yes Lowella Dell, MD  morphine (MSIR) 15 MG tablet Take 15 mg by mouth every 4 (four) hours as needed for pain.   Yes Historical Provider, MD  potassium chloride (MICRO-K) 10 MEQ CR capsule Take 1 capsule (10 mEq total)  by mouth 2 (two) times daily. 01/09/13  Yes Lowella Dell, MD  spironolactone (ALDACTONE) 25 MG tablet Take 12.5 mg by mouth every morning.   Yes Historical Provider, MD  VOLTAREN 1 % GEL Take 2-4 g by mouth 4 (four) times daily as needed (pain).  12/09/12  Yes Historical Provider, MD  warfarin (COUMADIN) 2.5 MG tablet Take 5-7.5 mg by mouth every evening. Take 7.5mg  on Monday and Wednesday. Take 5mg  each other day of the week 02/20/13  Yes Lowella Dell, MD  zolpidem (AMBIEN) 10 MG tablet Take 10 mg by mouth at bedtime as needed for sleep.  12/23/12  Yes Historical Provider, MD   Physical Exam: Filed Vitals:   03/21/13 0207  BP: 139/95  Pulse: 83  Temp: 98.8 F (37.1 C)  TempSrc: Oral  Resp: 22  Height:  5\' 3"  (1.6 m)  Weight: 124.512 kg (274 lb 8 oz)  SpO2: 96%    General:  NAD, resting comfortably in bed Eyes: PEERLA EOMI ENT: mucous membranes moist Neck: supple w/o JVD Cardiovascular: RRR w/o MRG Respiratory: CTA B Abdomen: soft, nt, nd, bs+ Skin: no rash nor lesion Musculoskeletal: MAE, full ROM all 4 extremities Psychiatric: normal tone and affect Neurologic: AAOx3, grossly non-focal  Labs on Admission:  Basic Metabolic Panel:  Recent Labs Lab 03/20/13 0821 03/21/13 0310  NA 141 138  K 3.9 3.5  CL  --  103  CO2 22 25  GLUCOSE 104 97  BUN 13.8 12  CREATININE 0.8 0.85  CALCIUM 9.4 9.7   Liver Function Tests:  Recent Labs Lab 03/20/13 0821  AST 10  ALT 12  ALKPHOS 75  BILITOT 0.53  PROT 8.3  ALBUMIN 3.2*   No results found for this basename: LIPASE, AMYLASE,  in the last 168 hours No results found for this basename: AMMONIA,  in the last 168 hours CBC:  Recent Labs Lab 03/20/13 0819 03/21/13 0310  WBC 8.5 8.2  NEUTROABS 4.8 4.2  HGB 11.1* 10.9*  HCT 32.0* 31.6*  MCV 77.1* 75.8*  PLT 301 279   Cardiac Enzymes: No results found for this basename: CKTOTAL, CKMB, CKMBINDEX, TROPONINI,  in the last 168 hours  BNP (last 3 results) No results found for this basename: PROBNP,  in the last 8760 hours CBG: No results found for this basename: GLUCAP,  in the last 168 hours  Radiological Exams on Admission: Dg Chest Port 1 View  03/21/2013   *RADIOLOGY REPORT*  Clinical Data: Sepsis  PORTABLE CHEST - 1 VIEW  Comparison: No CT from 11/19/2011  Findings: Right IJ prior line terminates at the cavoatrial junction.  The cardiac and mediastinal silhouettes are within normal limits.  The lungs are normally inflated.  No airspace consolidation to suggest an acute infectious or aspiration pneumonitis is identified.  There is no pleural effusion, pulmonary edema, or pneumothorax.  The osseous structures are within normal limits.  IMPRESSION: No focal airspace  consolidation to suggest acute infectious pneumonitis.   Original Report Authenticated By: Rise Mu, M.D.    EKG: Independently reviewed.  Assessment/Plan Principal Problem:   CRBSI (catheter-related bloodstream infection) Active Problems:   Hypertension   DVT (deep venous thrombosis)   Breast cancer metastasized to multiple sites   Gram-negative bacteremia   1. CRBSI with GNR - uncertain which culture came back positive first but strongly suspect this to be a CRBSI given lack of other obvious source of infection and the 3 week duration.  Getting cefepime 2gm q8h through peripheral  at this time.  Catheter left in place, plan is to attempt catheter salvage therapy if possible.  Plan to convert cefepime to be administered through central line with antibiotic locking once the organism is confirmed to be susceptible and amenable to salvage therapy (if the GNR turns out to be P. Aeruginosa, salvage therapy should not be attempted and the line should be removed).  Daily blood cultures ordered, if the patients bacteremia is persistent for 72 hours after ABx therapy initiated then catheter should be removed.  If the patient develops severe sepsis, hemodynamic instability, or other complications suggesting worsening infection, the catheter should be removed. 2. HTN - continue home meds 3. DVT - continue coumadin, pharm to dose 4. BRCA - s/p chemo with Herceptin, no other agents were used, monitor CBCs but neutropenia less likely.    Code Status: Full (must indicate code status--if unknown or must be presumed, indicate so) Family Communication: Spoke with family at bedside (indicate person spoken with, if applicable, with phone number if by telephone) Disposition Plan: Admit to inpatient (indicate anticipated LOS)  Time spent: 70 min  Aldene Hendon M. [facility] Pager [phone]  If 7PM-7AM, please contact night-coverage [email or web address] Password [facility] 03/21/2013, 4:39  AM

## 2013-03-21 NOTE — Consult Note (Signed)
[facility]    Date of Admission:  03/21/2013           Day 1 cefepime       Reason for Consult: Blood culture positive for gram-negative rods in setting of recent fevers    Referring Physician: Dr. Manson Passey   Principal Problem:   Gram-negative bacteremia Active Problems:   Hypertension   Anxiety   DVT (deep venous thrombosis)   Breast cancer metastasized to multiple sites   Anemia of chronic disease   . amLODipine  10 mg Oral q morning - 10a  . ceFEPime (MAXIPIME) IV  2 g Intravenous Q8H  . losartan  100 mg Oral Daily  . morphine  60 mg Oral BID  . potassium chloride  10 mEq Oral BID  . sodium chloride  3 mL Intravenous Q12H  . spironolactone  12.5 mg Oral q morning - 10a  . warfarin  5 mg Oral Custom  . [START ON 03/23/2013] warfarin  7.5 mg Oral Custom  . Warfarin - Pharmacist Dosing Inpatient   Does not apply q1800    Recommendations: 1. Continue cefepime pending final blood culture results   Assessment: Gram-negative rod bacteremia is probably the cause of her recent fever, chills and sweats but I cannot be certain that this his catheter-related bacteremia unless her peripheral blood culture also turns positive. I do not see any other obvious source for fever at this time. If this is catheter related bacteremia the options are to treat through the catheter and try to salvage it or have it removed then treat for 14 days before having a new Port-A-Cath placed. After a very brief discussion of current guidelines she is fairly adamant that she will want the Port-A-Cath removed so that she does not drag out this illness and run the risk of relapse. I will followup tomorrow.    HPI: [name] is a 62 y.o. female history of breast cancer who has been undergoing monthly Herceptin therapy through her Port-A-Cath. Shortly after July 4 she began to have intermittent chills, fevers and sweats. She has some days where she had no fevers  and other days where she had high fevers and she could not find any pattern. She has not had any problems around the Port-A-Cath site. She has severe, intermittent headaches but past 4 months that her primary care physician felt were probably do to migraine headaches. She's had some recent sinus congestion at night and thought she might be getting a cold. She has not had any sore throat, cough, shortness of breath, nausea, vomiting, diarrhea or dysuria.  She call the cancer center recently to let them know about the fever if and she had blood cultures drawn yesterday morning after her Herceptin therapy. She was called in to the hospital late last night after the one set drawn from her Port-A-Cath was already growing gram-negative rods.   Review of Systems: Constitutional: positive for chills, fevers and sweats, negative for anorexia and weight loss Eyes: negative Ears, nose, mouth, throat, and face: positive for nasal congestion, negative for earaches, sore mouth and sore throat Respiratory: negative Cardiovascular: negative Gastrointestinal: negative Genitourinary:negative Integument/breast: negative  Past Medical History  Diagnosis Date  . Breast cancer     mets to liver and lung  . Hypertension   . SVC syndrome   . History of chemotherapy Feb. 2006    taxotere/herceptin/carboplatin  . Radiation 07/31/2006  left upper chest  . Radiation 06/17/2006-06/27/2006    6480 cGy bilat. chest wall  . Neuropathy   . Thrombosis   . Breast cancer metastasized to multiple sites 02/26/2013    History  Substance Use Topics  . Smoking status: Never Smoker   . Smokeless tobacco: Never Used  . Alcohol Use: Yes     Comment: occasional    Family History  Problem Relation Age of Onset  . Heart failure Father   . Cancer Father     Prostate cancer  . Heart failure Brother   . Cancer Brother     Prostate cancer  . Diabetes Maternal Aunt    Allergies  Allergen Reactions  . Adhesive (Tape)  Other (See Comments)    Tears skin   . Penicillins Hives    OBJECTIVE: Blood pressure 107/52, pulse 68, temperature 98.1 F (36.7 C), temperature source Oral, resp. rate 18, height 5\' 3"  (1.6 m), weight 124 kg (273 lb 5.9 oz), SpO2 99.00%. General: She is alert, comfortable and in good spirits. There are about 10 family members with her in the room. Skin: No rash Lungs: Clear Cor: Regular S1 and S2 no murmurs. Right anterior chest Port-A-Cath site appears normal Abdomen: Soft and nontender without any masses Neuro: Alert with normal speech and conversation Joints and extremities: No acute abnormalities Mood and affect: Normal  Lab Results  Component Value Date   WBC 8.3 03/21/2013   HGB 10.2* 03/21/2013   HCT 29.3* 03/21/2013   MCV 76.3* 03/21/2013   PLT 259 03/21/2013   BMET    Component Value Date/Time   NA 138 03/21/2013 0658   NA 141 03/20/2013 0821   K 3.6 03/21/2013 0658   K 3.9 03/20/2013 0821   CL 103 03/21/2013 0658   CL 104 01/30/2013 0850   CO2 27 03/21/2013 0658   CO2 22 03/20/2013 0821   GLUCOSE 98 03/21/2013 0658   GLUCOSE 104 03/20/2013 0821   GLUCOSE 99 01/30/2013 0850   BUN 11 03/21/2013 0658   BUN 13.8 03/20/2013 0821   CREATININE 0.82 03/21/2013 0658   CREATININE 0.8 03/20/2013 0821   CALCIUM 9.5 03/21/2013 0658   CALCIUM 9.4 03/20/2013 0821   GFRNONAA 76* 03/21/2013 0658   GFRAA 88* 03/21/2013 0658   Lab Results  Component Value Date   ALT 12 03/20/2013   AST 10 03/20/2013   ALKPHOS 75 03/20/2013   BILITOT 0.53 03/20/2013   Urinalysis    Component Value Date/Time   COLORURINE YELLOW 05/29/2008 2113   APPEARANCEUR CLEAR 05/29/2008 2113   LABSPEC 1.020 03/20/2013 0834   LABSPEC 1.011 05/29/2008 2113   PHURINE 7.0 05/29/2008 2113   GLUCOSEU Negative 03/20/2013 0834   GLUCOSEU NEGATIVE 05/29/2008 2113   HGBUR SMALL* 05/29/2008 2113   BILIRUBINUR NEGATIVE 05/29/2008 2113   KETONESUR NEGATIVE 05/29/2008 2113   PROTEINUR NEGATIVE 05/29/2008 2113   UROBILINOGEN 0.2 03/20/2013  0834   UROBILINOGEN 0.2 05/29/2008 2113   NITRITE NEGATIVE 05/29/2008 2113   LEUKOCYTESUR TRACE* 05/29/2008 2113    Microbiology: Blood culture 03/20/2013: 1 set drawn from her Port-A-Cath is growing gram-negative rods; the second set drawn from her right arm is negative to date  Cliffton Asters, MD [facility] [phone] pager   [phone] cell 03/21/2013, 1:36 PM

## 2013-03-21 NOTE — ED Provider Notes (Signed)
Medical screening examination/treatment/procedure(s) were performed by non-physician practitioner and as supervising physician I was immediately available for consultation/collaboration.  Fatima Fedie M Megin Consalvo, MD 03/21/13 0726 

## 2013-03-22 DIAGNOSIS — I871 Compression of vein: Secondary | ICD-10-CM

## 2013-03-22 DIAGNOSIS — C50919 Malignant neoplasm of unspecified site of unspecified female breast: Secondary | ICD-10-CM

## 2013-03-22 DIAGNOSIS — C800 Disseminated malignant neoplasm, unspecified: Secondary | ICD-10-CM

## 2013-03-22 DIAGNOSIS — D638 Anemia in other chronic diseases classified elsewhere: Secondary | ICD-10-CM

## 2013-03-22 LAB — PROTIME-INR: Prothrombin Time: 23.3 seconds — ABNORMAL HIGH (ref 11.6–15.2)

## 2013-03-22 MED ORDER — DEXTROSE 5 % IV SOLN
1.0000 g | INTRAVENOUS | Status: DC
  Administered 2013-03-22 – 2013-03-24 (×3): 1 g via INTRAVENOUS
  Filled 2013-03-22 (×4): qty 10

## 2013-03-22 NOTE — Progress Notes (Addendum)
ANTICOAGULATION CONSULT NOTE - follow up  Pharmacy Consult for warfarin Indication: DVT (SVC syndrome)  Allergies  Allergen Reactions  . Adhesive (Tape) Other (See Comments)    Tears skin   . Penicillins Hives    Patient Measurements: Height: 5\' 3"  (160 cm) Weight: 273 lb 5.9 oz (124 kg) IBW/kg (Calculated) : 52.4  Vital Signs: Temp: 98.8 F (37.1 C) (07/27 0618) Temp src: Oral (07/27 0618) BP: 124/67 mmHg (07/27 0618) Pulse Rate: 68 (07/27 0618)  Labs:  Recent Labs  03/20/13 0819 03/20/13 0821 03/20/13 0900 03/21/13 0310 03/21/13 0658 03/22/13 0542  HGB 11.1*  --   --  10.9* 10.2*  --   HCT 32.0*  --   --  31.6* 29.3*  --   PLT 301  --   --  279 259  --   LABPROT  --   --   --   --  27.9* 23.3*  INR 2.70  --  2.7  --  2.72* 2.15*  CREATININE  --  0.8  --  0.85 0.82  --     Estimated Creatinine Clearance: 92.1 ml/min (by C-G formula based on Cr of 0.82).   Medications:  Prescriptions prior to admission  Medication Sig Dispense Refill  . acetaminophen (TYLENOL) 500 MG tablet Take 500 mg by mouth every 6 (six) hours as needed for pain or fever.      . ALPRAZolam (XANAX) 1 MG tablet Take 1 tablet (1 mg total) by mouth 3 (three) times daily as needed for anxiety.  90 tablet  1  . amLODipine (NORVASC) 10 MG tablet Take 10 mg by mouth every morning.      . diphenhydrAMINE (BENADRYL) 25 MG tablet Take 25 mg by mouth every 8 (eight) hours as needed for allergies.      . furosemide (LASIX) 80 MG tablet Take 40 mg by mouth daily as needed for fluid or edema.      Marland Kitchen losartan (COZAAR) 100 MG tablet Take 100 mg by mouth every morning.       Marland Kitchen morphine (MS CONTIN) 60 MG 12 hr tablet Take 1 tablet (60 mg total) by mouth 2 (two) times daily.  60 tablet  0  . morphine (MSIR) 15 MG tablet Take 15 mg by mouth every 4 (four) hours as needed for pain.      . potassium chloride (MICRO-K) 10 MEQ CR capsule Take 1 capsule (10 mEq total) by mouth 2 (two) times daily.  30 capsule  3   . spironolactone (ALDACTONE) 25 MG tablet Take 12.5 mg by mouth every morning.      Marland Kitchen VOLTAREN 1 % GEL Take 2-4 g by mouth 4 (four) times daily as needed (pain).       Marland Kitchen warfarin (COUMADIN) 2.5 MG tablet Take 5-7.5 mg by mouth every evening. Take 7.5mg  on Monday and Wednesday. Take 5mg  each other day of the week      . zolpidem (AMBIEN) 10 MG tablet Take 10 mg by mouth at bedtime as needed for sleep.        Assessment: 62yo F w/ breast cancer with occasional fevers for 3 weeks. Blood cultures drawn at [facility] growing GNR. Admitted for IV abx and blood cultures repeated. Cefepime started at the aggressive FN dose. Last chemo was day before admit. Pharmacy to manage chronic warfarin for DVT/SVC syndrome. INR at goal on admit so home regimen was continued and daily INRs ordered.  INR trended down to low therapeutic range overnight.  No CBC this am.  Broad-spectrum abx can increase INR response.  Tolerating diet.  To have PAC removed by IR tomorrow.  Goal of Therapy:  INR 2-3   Plan:   Cont home dose for now.  Check PT/INR daily.  TRH or IR please advise what INR is necessary for PAC removal. If reversal is necessary, Dr. Darnelle Catalan recommends bridging with prophylactic-dose Lovenox. For this obese patient that would be 60mg  SQ q24h.  Charolotte Eke, PharmD, pager [phone]. 03/22/2013,8:45 AM.

## 2013-03-22 NOTE — Progress Notes (Signed)
Patient ID: [name], female   DOB: Oct 03, 1950, 62 y.o.   MRN: [id number]         [facility]    Date of Admission:  03/21/2013           Day 2 cefepime  Principal Problem:   Gram-negative bacteremia Active Problems:   Hypertension   Anxiety   DVT (deep venous thrombosis)   Breast cancer metastasized to multiple sites   Anemia of chronic disease   . amLODipine  10 mg Oral q morning - 10a  . ceFEPime (MAXIPIME) IV  2 g Intravenous Q8H  . losartan  100 mg Oral Daily  . morphine  60 mg Oral BID  . potassium chloride  10 mEq Oral BID  . sodium chloride  3 mL Intravenous Q12H  . spironolactone  12.5 mg Oral q morning - 10a  . warfarin  5 mg Oral Custom  . [START ON 03/23/2013] warfarin  7.5 mg Oral Custom  . Warfarin - Pharmacist Dosing Inpatient   Does not apply q1800    Subjective: She is feeling better. She has not had any fever or chills since admission but over the past 3 weeks she would go similar stretches of time in between her fever spikes. She denies any new cough, shortness of breath, nausea, vomiting, diarrhea and dysuria.  Objective: Temp:  [98.4 F (36.9 C)-98.8 F (37.1 C)] 98.8 F (37.1 C) (07/27 0618) Pulse Rate:  [68-72] 68 (07/27 0618) Resp:  [16-20] 20 (07/27 0618) BP: (104-145)/(59-75) 145/75 mmHg (07/27 1033) SpO2:  [95 %-97 %] 97 % (07/27 0618)  General: She is alert and comfortable visiting with family Skin: No rash. Right upper chest Port-A-Cath site appears normal Lungs: Clear Cor: Regular S1 and S2 no murmurs Abdomen: Soft and nontender   Lab Results Lab Results  Component Value Date   WBC 8.3 03/21/2013   HGB 10.2* 03/21/2013   HCT 29.3* 03/21/2013   MCV 76.3* 03/21/2013   PLT 259 03/21/2013     Microbiology: Recent Results (from the past 240 hour(s))  URINE CULTURE     Status: None   Collection Time    03/20/13  8:21 AM      Result Value Range Status   Urine Culture, Routine Culture, Urine   Final   Comment: Final - ===== COLONY COUNT: =====     NO GROWTH     NO GROWTH  CULTURE, BLOOD (SINGLE)     Status: None   Collection Time    03/20/13  8:44 AM      Result Value Range Status   Blood Culture, Routine Culture, Blood   Final   Comment: ------------------------------------------------------------------------Gram Stain Report Called to,Read Back Byand Verified With:DR. ENNEVER ON 03/21/2013 AT 12:20A BY [email or web address] - ===== FINAL REPORT =====SERRATIA      MARCESCENS------------------------------------------------------------------------ SERRATIA MARCESCENS   CEFAZOLIN                        MIC      Resistant       >=64 ug/ml   CEFOXITIN                        MIC      Resistant       >=64 ug/ml        CEFTRIAXONE  MIC      Sensitive        <=1 ug/ml   CEFTAZIDIME                      MIC      Sensitive        <=1 ug/ml   CEFEPIME                         MIC      Sensitive        <=1 ug/ml   GENTAMICIN                       MIC           Sensitive        <=1 ug/ml   TOBRAMYCIN                       MIC      Sensitive          2 ug/ml   CIPROFLOXACIN                    MIC      Sensitive     <=0.25 ug/ml   LEVOFLOXACIN                     MIC      Sensitive          1 ug/ml        TRIMETH/SULFA                    MIC      Sensitive       <=20 ug/mlEND OF REPORT   Assessment: One set of blood cultures drawn through her Port-A-Cath on July 25 have a fairly sensitive Serratia. Her peripheral stick blood culture done at the same time remains negative. She and her husband now say that they think there has been a temporal association between flushing her catheter every Monday, Wednesday and Friday and her recent fever spikes. Even with a negative peripheral stick blood culture I strongly suspected that she has catheter related bacteremia. This is her third port and she may have some venous scarring that could make placement of a fourth catheter more difficult. I talked to her again about  the options of continuing antibiotics through her catheter (possibly with "antibiotic lock" treatment as well) and trying to salvage the catheter versus immediate removal of the catheter followed by 7-14 days of IV antibiotic before a new catheter is placed. I suggested that we ask interventional radiology to evaluate her before making a decision about catheter removal.  Plan: 1. Change cefepime to once daily ceftriaxone 2. Monitor for any further fever spikes 3. Await results of peripheral stick blood culture 4. Ask IR to evaluate potential difficulty of removing current port and then replacing with a new port at a later date  Cliffton Asters, MD [facility] [phone] pager   [phone] cell 03/22/2013, 12:40 PM

## 2013-03-22 NOTE — Progress Notes (Signed)
COURTESY NOTE:  Has pretty much decided to remove port; this can be done by IR tomorrow; ID to advise re optimal antibiotic plan once ID and sensitivities of organism(s) established.  Will follow with you

## 2013-03-22 NOTE — Progress Notes (Signed)
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 Patient did report fevers at home but has not had issues with the port-a-cath in past few years. Patient was started on cefepime on admission. CXR showed no acute cardiopulmonary process. WBC count is WNL. Hemoglobin on admission was 10.9. INR was 2.7 on admission, therapeutic.   Assessment/Plan:   Principal Problem:  *CRBSI (catheter-related bloodstream infection)  - continue cefepime for now  - 1 set of blood cultures is growing gram negative bacteremia but second set is negative - appreciate ID consult and their recommendations  - follow up blood cultures drawn on this admission.  *Gram-negative bacteremia  - follow up blood culture results from this admission  - continue cefepime  Active Problems:  Hypertension  - continue Norvasc and losartan  - BP 124/67 Anxiety  - continue ativan 1 mg PO TID PRN  DVT (deep venous thrombosis), SVC syndrome  - on coumadin  - INR therapeutic  Breast cancer metastasized to multiple sites  - management per oncology  - received herceptin 03/20/2013 and next dose duet  04/10/2013 - appreciate oncology following Anemia of chronic disease  - secondary to history of malignancy  - hemoglobin stable at 10.2  - no indications for transfusion   Code Status: full code  Family Communication:  updated the family at the bedside  Disposition Plan: home when stable   Manson Passey, MD  [facility]  Pager [phone]   Consultants:  Infectious disease Procedures:  None  Antibiotics:  Cefepime 03/21/2013 -->   If 7PM-7AM, please contact night-coverage [email or web address] Password [facility] 03/22/2013, 6:39 AM   LOS: 1 day    HPI/Subjective: No acute overnight events.  Objective: Filed Vitals:   03/21/13 1014 03/21/13 1400 03/21/13 2154 03/22/13 0618  BP: 107/52 104/61 109/59 124/67  Pulse: 68 72 70 68  Temp:  98.8 F (37.1 C) 98.4 F (36.9 C) 98.8 F (37.1 C)  TempSrc:  Oral Oral Oral  Resp:  18 16 20   Height:      Weight:      SpO2:  95% 97% 97%    Intake/Output Summary (Last 24 hours) at 03/22/13 8295 Last data filed at 03/21/13 1819  Gross per 24 hour  Intake   1080 ml  Output      5 ml  Net   1075 ml    Exam:   General:  Pt is alert, follows commands appropriately, not in acute distress  Cardiovascular: Regular rate and rhythm, S1/S2, no murmurs, no rubs, no gallops  Respiratory: Clear to auscultation bilaterally, no wheezing, no crackles, no rhonchi; port-a-cath in place  Abdomen: Soft, non tender, non distended, bowel sounds present, no guarding  Extremities: No edema, pulses DP and PT palpable bilaterally  Neuro: Grossly nonfocal  Data Reviewed: Basic Metabolic Panel:  Recent Labs Lab 03/20/13 0821 03/21/13 0310 03/21/13 0658  NA 141 138 138  K 3.9 3.5 3.6  CL  --  103 103  CO2 22  25 27  GLUCOSE 104 97 98  BUN 13.8 12 11   CREATININE 0.8 0.85 0.82  CALCIUM 9.4 9.7 9.5   Liver Function Tests:  Recent Labs Lab 03/20/13 0821  AST 10  ALT 12  ALKPHOS 75  BILITOT 0.53  PROT 8.3  ALBUMIN 3.2*   No results found for this basename: LIPASE, AMYLASE,  in the last 168 hours No results found for this basename: AMMONIA,  in the last 168 hours CBC:  Recent Labs Lab 03/20/13 0819 03/21/13 0310 03/21/13 0658  WBC 8.5 8.2 8.3  NEUTROABS 4.8  4.2  --   HGB 11.1* 10.9* 10.2*  HCT 32.0* 31.6* 29.3*  MCV 77.1* 75.8* 76.3*  PLT 301 279 259   Cardiac Enzymes: No results found for this basename: CKTOTAL, CKMB, CKMBINDEX, TROPONINI,  in the last 168 hours BNP: No components found with this basename: POCBNP,  CBG: No results found for this basename: GLUCAP,  in the last 168 hours  Recent Results (from the past 240 hour(s))  URINE CULTURE     Status: None   Collection Time    03/20/13  8:21 AM      Result Value Range Status   Urine Culture, Routine Culture, Urine   Final   Comment: Final - ===== COLONY COUNT: =====     NO GROWTH     NO GROWTH     Studies: Dg Chest Port 1 View  03/21/2013   *  IMPRESSION: No focal airspace consolidation to suggest acute infectious pneumonitis.      Scheduled Meds: . amLODipine  10 mg Oral q morning - 10a  . ceFEPime (MAXIPIME) IV  2 g Intravenous Q8H  . losartan  100 mg Oral Daily  . morphine  60 mg Oral BID  . potassium chloride  10 mEq Oral BID  . spironolactone  12.5 mg Oral q morning - 10a  . warfarin  5 mg Oral Custom   C

## 2013-03-23 DIAGNOSIS — F411 Generalized anxiety disorder: Secondary | ICD-10-CM

## 2013-03-23 LAB — PROTIME-INR
INR: 1.85 — ABNORMAL HIGH (ref 0.00–1.49)
Prothrombin Time: 20.8 seconds — ABNORMAL HIGH (ref 11.6–15.2)

## 2013-03-23 MED ORDER — ENOXAPARIN SODIUM 60 MG/0.6ML ~~LOC~~ SOLN
60.0000 mg | Freq: Every day | SUBCUTANEOUS | Status: DC
  Administered 2013-03-23: 60 mg via SUBCUTANEOUS
  Filled 2013-03-23 (×2): qty 0.6

## 2013-03-23 MED ORDER — WARFARIN SODIUM 7.5 MG PO TABS
7.5000 mg | ORAL_TABLET | Freq: Once | ORAL | Status: AC
  Administered 2013-03-23: 7.5 mg via ORAL
  Filled 2013-03-23: qty 1

## 2013-03-23 MED ORDER — WARFARIN SODIUM 7.5 MG PO TABS
7.5000 mg | ORAL_TABLET | Freq: Once | ORAL | Status: DC
  Filled 2013-03-23: qty 1

## 2013-03-23 NOTE — Progress Notes (Signed)
INFECTIOUS DISEASE PROGRESS NOTE  [name] is a 62 y.o. female with  Principal Problem:   Gram-negative bacteremia Active Problems:   Hypertension   Anxiety   DVT (deep venous thrombosis)   Breast cancer metastasized to multiple sites   Anemia of chronic disease  Subjective: Without complaints  Abtx:  Anti-infectives   Start     Dose/Rate Route Frequency Ordered Stop   03/22/13 1400  cefTRIAXone (ROCEPHIN) 1 g in dextrose 5 % 50 mL IVPB     1 g 100 mL/hr over 30 Minutes Intravenous Every 24 hours 03/22/13 1246     03/21/13 0330  ceFEPIme (MAXIPIME) 2 g in dextrose 5 % 50 mL IVPB  Status:  Discontinued     2 g 100 mL/hr over 30 Minutes Intravenous 3 times per day 03/21/13 0246 03/22/13 1246      Medications:  Scheduled: . amLODipine  10 mg Oral q morning - 10a  . cefTRIAXone (ROCEPHIN)  IV  1 g Intravenous Q24H  . enoxaparin (LOVENOX) injection  60 mg Subcutaneous Daily  . losartan  100 mg Oral Daily  . morphine  60 mg Oral BID  . potassium chloride  10 mEq Oral BID  . sodium chloride  3 mL Intravenous Q12H  . spironolactone  12.5 mg Oral q morning - 10a  . Warfarin - Pharmacist Dosing Inpatient   Does not apply q1800    Objective: Vital signs in last 24 hours: Temp:  [98.1 F (36.7 C)-98.4 F (36.9 C)] 98.1 F (36.7 C) (07/28 1344) Pulse Rate:  [69-77] 69 (07/28 1344) Resp:  [16] 16 (07/28 1344) BP: (120-131)/(74-87) 128/74 mmHg (07/28 1344) SpO2:  [99 %] 99 % (07/28 1344)   General appearance: alert, cooperative and no distress Resp: clear to auscultation bilaterally Chest wall: R chest PIC. non-tender, no fluctuance.  Cardio: regular rate and rhythm GI: normal findings: bowel sounds normal and soft, non-tender  Lab Results  Recent Labs  03/21/13 0310 03/21/13 0658  WBC 8.2 8.3  HGB 10.9* 10.2*  HCT 31.6* 29.3*  NA 138 138  K 3.5 3.6  CL 103 103  CO2 25 27  BUN 12 11  CREATININE 0.85 0.82   Liver Panel No results found for  this basename: PROT, ALBUMIN, AST, ALT, ALKPHOS, BILITOT, BILIDIR, IBILI,  in the last 72 hours Sedimentation Rate No results found for this basename: ESRSEDRATE,  in the last 72 hours C-Reactive Protein No results found for this basename: CRP,  in the last 72 hours  Microbiology: Recent Results (from the past 240 hour(s))  URINE CULTURE     Status: None   Collection Time    03/20/13  8:21 AM      Result Value Range Status   Urine Culture, Routine Culture, Urine   Final   Comment: Final - ===== COLONY COUNT: =====     NO GROWTH     NO GROWTH  CULTURE, BLOOD (SINGLE)     Status: None   Collection Time    03/20/13  8:44 AM      Result Value Range Status   Blood Culture, Routine Culture, Blood   Final   Comment: ------------------------------------------------------------------------Gram Stain Report Called to,Read Back Byand Verified With:DR. ENNEVER ON 03/21/2013 AT 12:20A BY [email or web address] - ===== FINAL REPORT =====SERRATIA      MARCESCENS------------------------------------------------------------------------ SERRATIA MARCESCENS   CEFAZOLIN  MIC      Resistant       >=64 ug/ml   CEFOXITIN                        MIC      Resistant       >=64 ug/ml        CEFTRIAXONE                      MIC      Sensitive        <=1 ug/ml   CEFTAZIDIME                      MIC      Sensitive        <=1 ug/ml   CEFEPIME                         MIC      Sensitive        <=1 ug/ml   GENTAMICIN                       MIC           Sensitive        <=1 ug/ml   TOBRAMYCIN                       MIC      Sensitive          2 ug/ml   CIPROFLOXACIN                    MIC      Sensitive     <=0.25 ug/ml   LEVOFLOXACIN                     MIC      Sensitive          1 ug/ml        TRIMETH/SULFA                    MIC      Sensitive       <=20 ug/mlEND OF REPORT  CULTURE, BLOOD (ROUTINE X 2)     Status: None   Collection Time    03/21/13  3:07 AM      Result Value Range Status   Specimen Description  BLOOD LEFT ARM   Final   Special Requests BOTTLES DRAWN AEROBIC AND ANAEROBIC 5 CC EACH   Final   Culture  Setup Time 03/21/2013 18:07   Final   Culture     Final   Value:        BLOOD CULTURE RECEIVED NO GROWTH TO DATE CULTURE WILL BE HELD FOR 5 DAYS BEFORE ISSUING A FINAL NEGATIVE REPORT   Report Status PENDING   Incomplete  CULTURE, BLOOD (ROUTINE X 2)     Status: None   Collection Time    03/21/13  3:10 AM      Result Value Range Status   Specimen Description BLOOD RIGHT ARM   Final   Special Requests BOTTLES DRAWN AEROBIC AND ANAEROBIC 5 CC EACH   Final   Culture  Setup Time 03/21/2013 18:07   Final   Culture     Final   Value:        BLOOD CULTURE RECEIVED NO GROWTH TO  DATE CULTURE WILL BE HELD FOR 5 DAYS BEFORE ISSUING A FINAL NEGATIVE REPORT   Report Status PENDING   Incomplete  CULTURE, BLOOD (SINGLE)     Status: None   Collection Time    03/22/13  5:42 AM      Result Value Range Status   Specimen Description BLOOD RIGHT ARM   Final   Special Requests BOTTLES DRAWN AEROBIC AND ANAEROBIC 5CC   Final   Culture  Setup Time 03/22/2013 15:17   Final   Culture     Final   Value:        BLOOD CULTURE RECEIVED NO GROWTH TO DATE CULTURE WILL BE HELD FOR 5 DAYS BEFORE ISSUING A FINAL NEGATIVE REPORT   Report Status PENDING   Incomplete    Studies/Results: No results found.   Assessment/Plan: Bacteremia- SA marcescens (R- ancef, cefoxitin) Metastatic Breast Cancer DVT/SVC syndrome  Awaiting IR eval of anatomy for possible removal of deep line and new line in unique site.  Will f/u.  thanks Total days of antibiotics: 3 (ceftriaxone)         Johny Sax Infectious Diseases (pager) [phone] [email or web address] 03/23/2013, 3:16 PM  LOS: 2 days

## 2013-03-23 NOTE — Progress Notes (Signed)
ANTICOAGULATION CONSULT NOTE - follow up  Pharmacy Consult for warfarin Indication: DVT (SVC syndrome)  Allergies  Allergen Reactions  . Adhesive (Tape) Other (See Comments)    Tears skin   . Penicillins Hives   Patient Measurements: Height: 5\' 3"  (160 cm) Weight: 273 lb 5.9 oz (124 kg) IBW/kg (Calculated) : 52.4  Labs:  Recent Labs  03/21/13 0310 03/21/13 0658 03/22/13 0542 03/23/13 0351  HGB 10.9* 10.2*  --   --   HCT 31.6* 29.3*  --   --   PLT 279 259  --   --   LABPROT  --  27.9* 23.3* 20.8*  INR  --  2.72* 2.15* 1.85*  CREATININE 0.85 0.82  --   --    Estimated Creatinine Clearance: 92.1 ml/min (by C-G formula based on Cr of 0.82).  Medications:  Prescriptions prior to admission  Medication Sig Dispense Refill  . acetaminophen (TYLENOL) 500 MG tablet Take 500 mg by mouth every 6 (six) hours as needed for pain or fever.      . ALPRAZolam (XANAX) 1 MG tablet Take 1 tablet (1 mg total) by mouth 3 (three) times daily as needed for anxiety.  90 tablet  1  . amLODipine (NORVASC) 10 MG tablet Take 10 mg by mouth every morning.      . diphenhydrAMINE (BENADRYL) 25 MG tablet Take 25 mg by mouth every 8 (eight) hours as needed for allergies.      . furosemide (LASIX) 80 MG tablet Take 40 mg by mouth daily as needed for fluid or edema.      Marland Kitchen losartan (COZAAR) 100 MG tablet Take 100 mg by mouth every morning.       Marland Kitchen morphine (MS CONTIN) 60 MG 12 hr tablet Take 1 tablet (60 mg total) by mouth 2 (two) times daily.  60 tablet  0  . morphine (MSIR) 15 MG tablet Take 15 mg by mouth every 4 (four) hours as needed for pain.      . potassium chloride (MICRO-K) 10 MEQ CR capsule Take 1 capsule (10 mEq total) by mouth 2 (two) times daily.  30 capsule  3  . spironolactone (ALDACTONE) 25 MG tablet Take 12.5 mg by mouth every morning.      Marland Kitchen VOLTAREN 1 % GEL Take 2-4 g by mouth 4 (four) times daily as needed (pain).       Marland Kitchen warfarin (COUMADIN) 2.5 MG tablet Take 5-7.5 mg by mouth every  evening. Take 7.5mg  on Monday and Wednesday. Take 5mg  each other day of the week      . zolpidem (AMBIEN) 10 MG tablet Take 10 mg by mouth at bedtime as needed for sleep.        Assessment: 62yo F w/ breast cancer with occasional fevers for 3 weeks. Blood cultures drawn at [facility] growing GNR. Admitted for IV abx and blood cultures repeated. Cefepime started at the aggressive FN dose. Last chemo was day before admit. Pharmacy to manage chronic warfarin for DVT/SVC syndrome. INR at goal on admit so home regimen was continued and daily INRs ordered.  INR trended down below therapeutic range on home regimen.   No CBC this am.  Broad-spectrum abx may increase INR response, currently on Rocephin  Tolerating diet well  Goal of Therapy:  INR 2-3   Plan:   Coumadin 7.5mg  today  Check PT/INR daily.  No PICC removal necessary at this point. Dr. Darnelle Catalan recommendend bridging with prophylactic-dose Lovenox (if removal was needed), INR subtherapeutic  this am, will prophylax with Lovenox. For this obese patient that would be 60mg  SQ q24h.  Otho Bellows PharmD Pager [phone] 03/23/2013, 10:17 AM

## 2013-03-23 NOTE — Progress Notes (Signed)
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 Patient did report fevers at home but has not had issues with the port-a-cath in past few years. Patient was started on cefepime on admission. CXR showed no acute cardiopulmonary process. WBC count is WNL. Hemoglobin on admission was 10.9. INR was 2.7 on admission, therapeutic.   Assessment/Plan:   Principal Problem:  *CRBSI (catheter-related bloodstream infection)  - started cefepime on admission, this is now changed to rocephin - 1 set of blood cultures is growing gram negative bacteremia but second set is negative  - appreciate ID following - blood cultures on this admission are all negative to date *Gram-negative bacteremia  - management as above  Active Problems:  Hypertension  - continue Norvasc and losartan  Anxiety  - continue ativan 1 mg PO TID PRN  DVT (deep venous thrombosis), SVC syndrome  - on coumadin; monitor INR daily Breast cancer metastasized to multiple sites  - management per oncology  - received herceptin 03/20/2013 and next dose duet 04/10/2013  - appreciate oncology following  Anemia of chronic disease  - secondary to history of malignancy  - hemoglobin stable at 10.2  - no indications for transfusion   Code Status: full code  Family Communication: updated the family at the bedside  Disposition  Plan: home when stable   Manson Passey, MD  [facility]  Pager [phone]   Consultants:  Infectious disease Procedures:  None  Antibiotics:  Cefepime 03/21/2013 --> 03/23/2013  Rocephin 03/23/2013 -->   If 7PM-7AM, please contact night-coverage [email or web address] Password [facility] 03/23/2013, 7:48 AM   LOS: 2 days    HPI/Subjective: No acute overnight events.   Objective: Filed Vitals:   03/22/13 1033 03/22/13 1510 03/22/13 2130 03/23/13 0622  BP: 145/75 143/69 131/75 120/87  Pulse:  71 71 77  Temp:  98.9 F (37.2 C) 98.4 F (36.9 C) 98.1 F (36.7 C)  TempSrc:  Oral Oral Oral  Resp:  16 16 16   Height:      Weight:      SpO2:  98% 99% 99%    Intake/Output Summary (Last 24 hours) at 03/23/13 0748 Last data filed at 03/23/13 0600  Gross per 24 hour  Intake   1133 ml  Output     10 ml  Net   1123 ml    Exam:   General:  Pt is alert, follows commands appropriately, not in acute distress  Cardiovascular: Regular rate and rhythm, S1/S2, no murmurs, no rubs, no gallops  Respiratory: Clear to auscultation bilaterally, no wheezing, no crackles, no rhonchi  Abdomen: Soft, non tender, non distended, bowel sounds present, no guarding  Extremities: No edema, pulses DP and PT palpable bilaterally  Neuro: Grossly nonfocal  Data Reviewed: Basic Metabolic Panel:  Recent Labs Lab 03/20/13 0821 03/21/13 0310 03/21/13 0658  NA 141 138 138  K 3.9 3.5 3.6  CL  --  103 103  CO2 22 25 27   GLUCOSE 104 97 98  BUN 13.8 12 11   CREATININE 0.8 0.85 0.82  CALCIUM 9.4 9.7 9.5   Liver Function Tests:  Recent Labs Lab 03/20/13 0821  AST 10  ALT 12  ALKPHOS 75  BILITOT 0.53  PROT 8.3  ALBUMIN 3.2*   No results found for this basename: LIPASE, AMYLASE,  in the last 168 hours No results found for this basename: AMMONIA,  in the last 168 hours CBC:  Recent Labs Lab 03/20/13 0819 03/21/13 0310 03/21/13 0658  WBC 8.5 8.2 8.3  NEUTROABS 4.8 4.2  --   HGB 11.1* 10.9* 10.2*   HCT 32.0* 31.6* 29.3*  MCV 77.1* 75.8* 76.3*  PLT 301 279 259   Cardiac Enzymes: No results found for this basename: CKTOTAL, CKMB, CKMBINDEX, TROPONINI,  in the last 168 hours BNP: No components found with this basename: POCBNP,  CBG: No results found for this basename: GLUCAP,  in the last 168 hours  Recent Results (from the past 240 hour(s))  URINE CULTURE     Status: None   Collection Time    03/20/13  8:21 AM      Result Value Range Status   Urine Culture, Routine Culture, Urine   Final   Comment: Final - ===== COLONY COUNT: =====     NO GROWTH     NO GROWTH  CULTURE, BLOOD (SINGLE)     Status: None   Collection Time    03/20/13  8:44 AM      Result Value Range Status   Blood Culture, Routine Culture, Blood   Final   Comment: ------------------------------------------------------------------------Gram Stain Report Called to,Read Back Byand Verified With:DR. ENNEVER ON 03/21/2013 AT 12:20A BY [email or web address] - ===== FINAL REPORT =====SERRATIA      MARCESCENS------------------------------------------------------------------------ SERRATIA MARCESCENS   CEFAZOLIN                        MIC      Resistant       >=64 ug/ml   CEFOXITIN                        MIC      Resistant       >=64 ug/ml        CEFTRIAXONE                      MIC      Sensitive        <=1 ug/ml   CEFTAZIDIME                      MIC      Sensitive        <=1 ug/ml   CEFEPIME                         MIC      Sensitive        <=1 ug/ml   GENTAMICIN                       MIC           Sensitive        <=1 ug/ml   TOBRAMYCIN                       MIC      Sensitive          2 ug/ml  CIPROFLOXACIN                    MIC      Sensitive     <=0.25 ug/ml   LEVOFLOXACIN                     MIC      Sensitive          1 ug/ml        TRIMETH/SULFA                    MIC      Sensitive       <=20 ug/mlEND OF REPORT  CULTURE, BLOOD (ROUTINE X 2)     Status: None   Collection Time    03/21/13  3:07 AM      Result Value Range  Status   Specimen Description BLOOD LEFT ARM   Final   Special Requests BOTTLES DRAWN AEROBIC AND ANAEROBIC 5 CC EACH   Final   Culture  Setup Time 03/21/2013 18:07   Final   Culture     Final   Value:        BLOOD CULTURE RECEIVED NO GROWTH TO DATE CULTURE WILL BE HELD FOR 5 DAYS BEFORE ISSUING A FINAL NEGATIVE REPORT   Report Status PENDING   Incomplete  CULTURE, BLOOD (ROUTINE X 2)     Status: None   Collection Time    03/21/13  3:10 AM      Result Value Range Status   Specimen Description BLOOD RIGHT ARM   Final   Special Requests BOTTLES DRAWN AEROBIC AND ANAEROBIC 5 CC EACH   Final   Culture  Setup Time 03/21/2013 18:07   Final   Culture     Final   Value:        BLOOD CULTURE RECEIVED NO GROWTH TO DATE CULTURE WILL BE HELD FOR 5 DAYS BEFORE ISSUING A FINAL NEGATIVE REPORT   Report Status PENDING   Incomplete     Studies: No results found.  Scheduled Meds: . amLODipine  10 mg Oral q morning - 10a  . cefTRIAXone (ROCEPHIN)  IV  1 g Intravenous Q24H  . losartan  100 mg Oral Daily  . morphine  60 mg Oral BID  . potassium chloride  10 mEq Oral BID  . spironolactone  12.5 mg Oral q morning - 10a  . warfarin  5 mg Oral Custom  . warfarin  7.5 mg Oral Custom

## 2013-03-24 ENCOUNTER — Inpatient Hospital Stay (HOSPITAL_COMMUNITY): Payer: Medicare Other

## 2013-03-24 MED ORDER — ENOXAPARIN SODIUM 60 MG/0.6ML ~~LOC~~ SOLN
60.0000 mg | Freq: Every day | SUBCUTANEOUS | Status: DC
  Administered 2013-03-24 – 2013-03-25 (×2): 60 mg via SUBCUTANEOUS
  Filled 2013-03-24 (×2): qty 0.6

## 2013-03-24 MED ORDER — POLYETHYLENE GLYCOL 3350 17 G PO PACK
17.0000 g | PACK | Freq: Every day | ORAL | Status: DC
  Administered 2013-03-24 – 2013-03-25 (×2): 17 g via ORAL
  Filled 2013-03-24 (×2): qty 1

## 2013-03-24 MED ORDER — WARFARIN SODIUM 10 MG PO TABS
10.0000 mg | ORAL_TABLET | Freq: Once | ORAL | Status: AC
  Administered 2013-03-24: 10 mg via ORAL
  Filled 2013-03-24: qty 1

## 2013-03-24 MED ORDER — SODIUM CHLORIDE 0.9 % IV BOLUS (SEPSIS)
500.0000 mL | Freq: Once | INTRAVENOUS | Status: AC
  Administered 2013-03-24: 500 mL via INTRAVENOUS

## 2013-03-24 MED ORDER — SENNOSIDES-DOCUSATE SODIUM 8.6-50 MG PO TABS
1.0000 | ORAL_TABLET | Freq: Two times a day (BID) | ORAL | Status: DC
  Administered 2013-03-24 – 2013-03-25 (×3): 1 via ORAL
  Filled 2013-03-24 (×4): qty 1

## 2013-03-24 NOTE — Progress Notes (Signed)
ANTICOAGULATION CONSULT NOTE - follow up  Pharmacy Consult for warfarin Indication: DVT (SVC syndrome)  Allergies  Allergen Reactions  . Adhesive (Tape) Other (See Comments)    Tears skin   . Penicillins Hives   Patient Measurements: Height: 5\' 3"  (160 cm) Weight: 273 lb 5.9 oz (124 kg) IBW/kg (Calculated) : 52.4  Labs:  Recent Labs  03/22/13 0542 03/23/13 0351 03/24/13 0400  LABPROT 23.3* 20.8* 19.9*  INR 2.15* 1.85* 1.75*   Estimated Creatinine Clearance: 92.1 ml/min (by C-G formula based on Cr of 0.82).  Medications:  Prescriptions prior to admission  Medication Sig Dispense Refill  . acetaminophen (TYLENOL) 500 MG tablet Take 500 mg by mouth every 6 (six) hours as needed for pain or fever.      . ALPRAZolam (XANAX) 1 MG tablet Take 1 tablet (1 mg total) by mouth 3 (three) times daily as needed for anxiety.  90 tablet  1  . amLODipine (NORVASC) 10 MG tablet Take 10 mg by mouth every morning.      . diphenhydrAMINE (BENADRYL) 25 MG tablet Take 25 mg by mouth every 8 (eight) hours as needed for allergies.      . furosemide (LASIX) 80 MG tablet Take 40 mg by mouth daily as needed for fluid or edema.      Marland Kitchen losartan (COZAAR) 100 MG tablet Take 100 mg by mouth every morning.       Marland Kitchen morphine (MS CONTIN) 60 MG 12 hr tablet Take 1 tablet (60 mg total) by mouth 2 (two) times daily.  60 tablet  0  . morphine (MSIR) 15 MG tablet Take 15 mg by mouth every 4 (four) hours as needed for pain.      . potassium chloride (MICRO-K) 10 MEQ CR capsule Take 1 capsule (10 mEq total) by mouth 2 (two) times daily.  30 capsule  3  . spironolactone (ALDACTONE) 25 MG tablet Take 12.5 mg by mouth every morning.      Marland Kitchen VOLTAREN 1 % GEL Take 2-4 g by mouth 4 (four) times daily as needed (pain).       Marland Kitchen warfarin (COUMADIN) 2.5 MG tablet Take 5-7.5 mg by mouth every evening. Take 7.5mg  on Monday and Wednesday. Take 5mg  each other day of the week      . zolpidem (AMBIEN) 10 MG tablet Take 10 mg by  mouth at bedtime as needed for sleep.        Assessment: 62yo F w/ breast cancer with occasional fevers for 3 weeks. Blood cultures drawn at [facility] growing GNR. Admitted for IV abx and blood cultures repeated. Cefepime started at the aggressive FN dose. Last chemo was day before admit. Pharmacy to manage chronic warfarin for DVT/SVC syndrome. INR at goal on admit so home regimen was continued and daily INRs ordered.  INR trended down below therapeutic range on home regimen.   No CBC this am.  Broad-spectrum abx may increase INR response, currently on Rocephin  Tolerating diet well  PICC removed this am, INR 1.75 with Lovenox held this am (no plan for [facility] placement)   Dr. Darnelle Catalan recommendend bridging with prophylactic-dose Lovenox (if removal was needed), INR still subtherapeutic this am, will prophylax with Lovenox. For this obese patient that would be 60mg  SQ q24h.  Goal of Therapy:  INR 2-3   Plan:   Lovenox 60mg  daily until INR > 2  Coumadin 10mg  today  Check PT/INR daily.  Otho Bellows PharmD Pager [phone] 03/24/2013, 2:43 PM

## 2013-03-24 NOTE — Progress Notes (Addendum)
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 Patient has PICC line and has reported no problems in last 3 years. Patient was started on cefepime on admission. CXR showed no acute cardiopulmonary process. WBC count was WNL. Hemoglobin was 10.9 on admission. INR was 2.7. Repeat blood cultures show no growth to date. Patient remains afebrile since the admission. Plan is for PICC line removal today.  Assessment/Plan:   Principal Problem:  *CRBSI (catheter-related bloodstream infection)  - started cefepime on admission, this is now changed to rocephin 1 gm IV Q 24 hours (started 03/23/2013) - patient remains afebrile since the admission and WBC count is WNL. Plan is for PICC line to be removed today by interventional radiology. - blood culture positive for serratia (the cultures done in oncology office); blood culture done on the admission are all negative to date.  - appreciate ID following and their recommendations in regards to the length of antibiotics (rocephin for 14 days starting 03/24/2013) *Gram-negative bacteremia  - secondary to Serratia Marcescenz - management as above with rocephin 1 gm every 24 hours IV  Active Problems:  Hypertension  - continue Norvasc and losartan  - BP 132/86 Anxiety  - continue ativan 1 mg PO TID PRN  DVT (deep venous thrombosis), SVC syndrome  -  on coumadin; monitor INR daily  Breast cancer metastasized to multiple sites  - management per oncology  - received herceptin 03/20/2013 and next dose duet 04/10/2013  - appreciate oncology seeing the patient on this hospital admission Anemia of chronic disease  - secondary to history of malignancy  - hemoglobin stable at 10.2  - no indications for transfusion   Code Status: full code  Family Communication: updated the family at the bedside daily Disposition Plan: home when stable   Manson Passey, MD  [facility]  Pager [phone]   Consultants:  Infectious disease Interventional radiology Procedures:  None  Antibiotics:  Cefepime 03/21/2013 --> 03/23/2013  Rocephin 03/23/2013 -->   If 7PM-7AM, please contact night-coverage [email or web address] Password [facility] 03/24/2013, 7:33 AM   LOS: 3 days    HPI/Subjective: No acute overnight events. Feels better today.   Objective: Filed Vitals:   03/23/13 1344 03/23/13 2154 03/24/13 0147 03/24/13 0623  BP: 128/74 97/50 94/50  135/86  Pulse: 69 69 66 74  Temp: 98.1 F (36.7 C) 98.3 F (36.8 C) 98.7 F (37.1 C) 98.3 F (36.8 C)  TempSrc: Oral Oral Oral Oral  Resp: 16 16 16 16   Height:      Weight:      SpO2: 99% 95% 97% 99%    Intake/Output Summary (Last 24 hours) at 03/24/13 0733 Last data filed at 03/23/13 1714  Gross per 24 hour  Intake    720 ml  Output      4 ml  Net    716 ml    Exam:   General:  Pt is alert, follows commands appropriately, not in acute distress  Cardiovascular: Regular rate and rhythm, S1/S2, no  murmurs, no rubs, no gallops  Respiratory: Clear to auscultation bilaterally, no wheezing, no crackles, no rhonchi  Abdomen: Soft, non tender, non distended, bowel sounds present, no guarding  Extremities: No edema, pulses DP and PT palpable bilaterally  Neuro: Grossly nonfocal  Data Reviewed: Basic Metabolic Panel:  Recent Labs Lab 03/20/13 0821 03/21/13 0310 03/21/13 0658  NA 141 138 138  K 3.9 3.5  3.6  CL  --  103 103  CO2 22 25 27   GLUCOSE 104 97 98  BUN 13.8 12 11   CREATININE 0.8 0.85 0.82  CALCIUM 9.4 9.7 9.5   Liver Function Tests:  Recent Labs Lab 03/20/13 0821  AST 10  ALT 12  ALKPHOS 75  BILITOT 0.53  PROT 8.3  ALBUMIN 3.2*   No results found for this basename: LIPASE, AMYLASE,  in the last 168 hours No results found for this basename: AMMONIA,  in the last 168 hours CBC:  Recent Labs Lab 03/20/13 0819 03/21/13 0310 03/21/13 0658  WBC 8.5 8.2 8.3  NEUTROABS 4.8 4.2  --   HGB 11.1* 10.9* 10.2*  HCT 32.0* 31.6* 29.3*  MCV 77.1* 75.8* 76.3*  PLT 301 279 259   Cardiac Enzymes: No results found for this basename: CKTOTAL, CKMB, CKMBINDEX, TROPONINI,  in the last 168 hours BNP: No components found with this basename: POCBNP,  CBG: No results found for this basename: GLUCAP,  in the last 168 hours  Recent Results (from the past 240 hour(s))  URINE CULTURE     Status: None   Collection Time    03/20/13  8:21 AM      Result Value Range Status   Urine Culture, Routine Culture, Urine   Final   Comment: Final - ===== COLONY COUNT: =====     NO GROWTH     NO GROWTH  CULTURE, BLOOD (SINGLE)     Status: None   Collection Time    03/20/13  8:44 AM      Result Value Range Status   Blood Culture, Routine Culture, Blood   Final   Comment: ------------------------------------------------------------------------Gram Stain Report Called to,Read Back Byand Verified With:DR. ENNEVER ON 03/21/2013 AT 12:20A BY [email or web address] - ===== FINAL REPORT =====SERRATIA      MARCESCENS------------------------------------------------------------------------ SERRATIA MARCESCENS   CEFAZOLIN                        MIC      Resistant       >=64 ug/ml   CEFOXITIN                        MIC      Resistant       >=64 ug/ml        CEFTRIAXONE                      MIC      Sensitive        <=1 ug/ml   CEFTAZIDIME                      MIC      Sensitive        <=1 ug/ml   CEFEPIME                          MIC      Sensitive        <=1 ug/ml   GENTAMICIN  MIC           Sensitive        <=1 ug/ml   TOBRAMYCIN                       MIC      Sensitive          2 ug/ml   CIPROFLOXACIN                    MIC      Sensitive     <=0.25 ug/ml   LEVOFLOXACIN                     MIC      Sensitive          1 ug/ml        TRIMETH/SULFA                    MIC      Sensitive       <=20 ug/mlEND OF REPORT  CULTURE, BLOOD (ROUTINE X 2)     Status: None   Collection Time    03/21/13  3:07 AM      Result Value Range Status   Specimen Description BLOOD LEFT ARM   Final   Special Requests BOTTLES DRAWN AEROBIC AND ANAEROBIC 5 CC EACH   Final   Culture  Setup Time 03/21/2013 18:07   Final   Culture     Final   Value:        BLOOD CULTURE RECEIVED NO GROWTH TO DATE CULTURE WILL BE HELD FOR 5 DAYS BEFORE ISSUING A FINAL NEGATIVE REPORT   Report Status PENDING   Incomplete  CULTURE, BLOOD (ROUTINE X 2)     Status: None   Collection Time    03/21/13  3:10 AM      Result Value Range Status   Specimen Description BLOOD RIGHT ARM   Final   Special Requests BOTTLES DRAWN AEROBIC AND ANAEROBIC 5 CC EACH   Final   Culture  Setup Time 03/21/2013 18:07   Final   Culture     Final   Value:        BLOOD CULTURE RECEIVED NO GROWTH TO DATE CULTURE WILL BE HELD FOR 5 DAYS BEFORE ISSUING A FINAL NEGATIVE REPORT   Report Status PENDING   Incomplete  CULTURE, BLOOD (SINGLE)     Status: None   Collection Time    03/22/13  5:42 AM      Result Value Range Status   Specimen Description BLOOD RIGHT ARM   Final   Special Requests BOTTLES DRAWN AEROBIC AND ANAEROBIC 5CC   Final   Culture  Setup Time 03/22/2013 15:17   Final   Culture     Final   Value:        BLOOD CULTURE RECEIVED NO GROWTH TO DATE CULTURE WILL BE HELD FOR 5 DAYS BEFORE ISSUING A FINAL NEGATIVE REPORT   Report Status PENDING   Incomplete     Studies: No results found.  Scheduled Meds: . amLODipine  10 mg Oral q morning - 10a  .  cefTRIAXone (ROCEPHIN)  IV  1 g Intravenous Q24H  . enoxaparin (LOVENOX)   60 mg Subcutaneous Daily  . losartan  100 mg Oral Daily  . morphine  60 mg Oral BID  . potassium chloride  10 mEq Oral BID  . spironolactone  12.5 mg Oral q morning -  10a  . Warfarin    Does not apply q1800

## 2013-03-24 NOTE — Progress Notes (Signed)
03/24/13 0955 Patient ate breakfast at 0745 today.  Since then she has been NPO for IR procedure to have PICC line removed.

## 2013-03-24 NOTE — Progress Notes (Signed)
[name]   DOB:[birth year]   ZO#:[id number]   JWJ#:[id number]  Subjective: c/o constipation; ambulated halls yesterday; wants PICC removed; otherwise tolerating antibiotics well; no family in room   Objective: middle aged ASfrican American woman examined in bed Filed Vitals:   03/24/13 0623  BP: 135/86  Pulse: 74  Temp: 98.3 F (36.8 C)  Resp: 16    Body mass index is 48.44 kg/(m^2).  Intake/Output Summary (Last 24 hours) at 03/24/13 0830 Last data filed at 03/23/13 1714  Gross per 24 hour  Intake    720 ml  Output      4 ml  Net    716 ml     Sclerae unicteric  Lungs clear -- no rales or rhonchi  Heart regular rate and rhythm  Abdomen soft, NT, +BS  MSK PICC in anterior R chest wall, no tenderness, swelling or erythema  Neuro nonfocal    CBG (last 3)  No results found for this basename: GLUCAP,  in the last 72 hours   Labs:  Lab Results  Component Value Date   WBC 8.3 03/21/2013   HGB 10.2* 03/21/2013   HCT 29.3* 03/21/2013   MCV 76.3* 03/21/2013   PLT 259 03/21/2013   NEUTROABS 4.2 03/21/2013    @LASTCHEMISTRY @  Urine Studies No results found for this basename: UACOL, UAPR, USPG, UPH, UTP, UGL, UKET, UBIL, UHGB, UNIT, UROB, ULEU, UEPI, UWBC, URBC, UBAC, CAST, CRYS, UCOM, BILUA,  in the last 72 hours  Basic Metabolic Panel:  Recent Labs Lab 03/20/13 0821 03/21/13 0310 03/21/13 0658  NA 141 138 138  K 3.9 3.5 3.6  CL  --  103 103  CO2 22 25 27   GLUCOSE 104 97 98  BUN 13.8 12 11   CREATININE 0.8 0.85 0.82  CALCIUM 9.4 9.7 9.5   GFR Estimated Creatinine Clearance: 92.1 ml/min (by C-G formula based on Cr of 0.82). Liver Function Tests:  Recent Labs Lab 03/20/13 0821  AST 10  ALT 12  ALKPHOS 75  BILITOT 0.53  PROT 8.3  ALBUMIN 3.2*   No results found for this basename: LIPASE, AMYLASE,  in the last 168 hours No results found for this basename: AMMONIA,  in the last 168 hours Coagulation profile  Recent Labs Lab 03/20/13 0819  03/20/13 0900 03/21/13 0658 03/22/13 0542 03/23/13 0351 03/24/13 0400  INR 2.70 2.7 2.72* 2.15* 1.85* 1.75*  PROTIME 32.4*  --   --   --   --   --     CBC:  Recent Labs Lab 03/20/13 0819 03/21/13 0310 03/21/13 0658  WBC 8.5 8.2 8.3  NEUTROABS 4.8 4.2  --   HGB 11.1* 10.9* 10.2*  HCT 32.0* 31.6* 29.3*  MCV 77.1* 75.8* 76.3*  PLT 301 279 259   Cardiac Enzymes: No results found for this basename: CKTOTAL, CKMB, CKMBINDEX, TROPONINI,  in the last 168 hours BNP: No components found with this basename: POCBNP,  CBG: No results found for this basename: GLUCAP,  in the last 168 hours D-Dimer No results found for this basename: DDIMER,  in the last 72 hours Hgb A1c No results found for this basename: HGBA1C,  in the last 72 hours Lipid Profile No results found for this basename: CHOL, HDL, LDLCALC, TRIG, CHOLHDL, LDLDIRECT,  in the last 72 hours Thyroid function studies No results found for this basename: TSH, T4TOTAL, FREET3, T3FREE, THYROIDAB,  in the last 72 hours Anemia work up No results found for this basename: VITAMINB12, FOLATE, FERRITIN, TIBC, IRON, RETICCTPCT,  in the last 72 hours Microbiology Recent Results (from the past 240 hour(s))  URINE CULTURE     Status: None   Collection Time    03/20/13  8:21 AM      Result Value Range Status   Urine Culture, Routine Culture, Urine   Final   Comment: Final - ===== COLONY COUNT: =====     NO GROWTH     NO GROWTH  CULTURE, BLOOD (SINGLE)     Status: None   Collection Time    03/20/13  8:44 AM      Result Value Range Status   Blood Culture, Routine Culture, Blood   Final   Comment: ------------------------------------------------------------------------Gram Stain Report Called to,Read Back Byand Verified With:DR. ENNEVER ON 03/21/2013 AT 12:20A BY [email or web address] - ===== FINAL REPORT =====SERRATIA      MARCESCENS------------------------------------------------------------------------ SERRATIA MARCESCENS   CEFAZOLIN                         MIC      Resistant       >=64 ug/ml   CEFOXITIN                        MIC      Resistant       >=64 ug/ml        CEFTRIAXONE                      MIC      Sensitive        <=1 ug/ml   CEFTAZIDIME                      MIC      Sensitive        <=1 ug/ml   CEFEPIME                         MIC      Sensitive        <=1 ug/ml   GENTAMICIN                       MIC           Sensitive        <=1 ug/ml   TOBRAMYCIN                       MIC      Sensitive          2 ug/ml   CIPROFLOXACIN                    MIC      Sensitive     <=0.25 ug/ml   LEVOFLOXACIN                     MIC      Sensitive          1 ug/ml        TRIMETH/SULFA                    MIC      Sensitive       <=20 ug/mlEND OF REPORT  CULTURE, BLOOD (ROUTINE X 2)     Status: None   Collection Time    03/21/13  3:07 AM      Result Value Range Status   Specimen  Description BLOOD LEFT ARM   Final   Special Requests BOTTLES DRAWN AEROBIC AND ANAEROBIC 5 CC EACH   Final   Culture  Setup Time 03/21/2013 18:07   Final   Culture     Final   Value:        BLOOD CULTURE RECEIVED NO GROWTH TO DATE CULTURE WILL BE HELD FOR 5 DAYS BEFORE ISSUING A FINAL NEGATIVE REPORT   Report Status PENDING   Incomplete  CULTURE, BLOOD (ROUTINE X 2)     Status: None   Collection Time    03/21/13  3:10 AM      Result Value Range Status   Specimen Description BLOOD RIGHT ARM   Final   Special Requests BOTTLES DRAWN AEROBIC AND ANAEROBIC 5 CC EACH   Final   Culture  Setup Time 03/21/2013 18:07   Final   Culture     Final   Value:        BLOOD CULTURE RECEIVED NO GROWTH TO DATE CULTURE WILL BE HELD FOR 5 DAYS BEFORE ISSUING A FINAL NEGATIVE REPORT   Report Status PENDING   Incomplete  CULTURE, BLOOD (SINGLE)     Status: None   Collection Time    03/22/13  5:42 AM      Result Value Range Status   Specimen Description BLOOD RIGHT ARM   Final   Special Requests BOTTLES DRAWN AEROBIC AND ANAEROBIC 5CC   Final   Culture  Setup Time 03/22/2013 15:17    Final   Culture     Final   Value:        BLOOD CULTURE RECEIVED NO GROWTH TO DATE CULTURE WILL BE HELD FOR 5 DAYS BEFORE ISSUING A FINAL NEGATIVE REPORT   Report Status PENDING   Incomplete      Studies:  No results found.  Assessment: 62 y.o. 62 y.o. [name], [place] woman admitted with fever, blood cultures positive for GNRs, port in place  (1) with a history of inflammatory right breast cancer metastatic at presentation September 2004 with involvement of liver and bone, HER-2 positive, estrogen and progesterone receptor negative  (2) treated with carboplatin, docetaxel and Herceptin x6 completed April 2005  (3) trastuzumab continued indefinitely; has also received lapatinib and capecitabine for variable intervals in 2007-2008.  (4) status post bilateral mastectomies with bilateral axillary lymph node dissection 12/07/2004, showing  (a) on the right, a mypT1c ypN1 invasive ductal carcinoma, grade 3, estrogen and progesterone receptor negative, HER-2 positive, with an MIB-1 of 31%  (b) on the left, ypT2 ypN1 invasive ductal carcinoma, grade 2, estrogen and progesterone receptor negative, HER-2 positive, with an MIB-1 of 35%.  (5) Status post radiation June through July of 2006, to the right chest wall, left chest wall, bilateral supraclavicular fossae, and bilateral axillary boosts; with additional radiation to the right and left chest walls and the central chest wall completed November of 2007  (6) status post ixempra x9 completed August of 2009.  (7) history of superior vena caval syndrome, on life long anticoagulation  (8) History of chemotherapy-induced neuropathy. On Neurontin 600 mg p.o. T.i.d., MS Contin, and MSIR. (9) refuses zolendronate or other biphosphonates  PLAN: Will have PICC removed,hopefully today. I have tentatively made her NPO, though I am not sure that is necessary; will hold heparin this AM as well. Have requested that PICC be sent for Gram stain and  culture. Also added bowel prophylaxis  Perhaps could be discharged tomorrow depending on regimen recommended by ID  Appreciate  your help to this patient! Will follow with you.        Lowella Dell, MD 03/24/2013  8:30 AM

## 2013-03-24 NOTE — Procedures (Signed)
Successful removal of Rt IJ tunneled PICC. Tip length noted to be at 22.5cm, which is a discrepancy from original procedure note of 27.5cm length. As a precaution, CXR was performed to assess for catheter breakage and retained segment, however CXR was clear, no evidence for retained catheter.. This would indicate a 'typo' in the original report, from 07/24/2011 Tip sent for culture as ordered. No complications Sterile occlusive applied--to remain X 24hrs.  Brayton El PA-C Interventional Radiology 03/24/2013 11:37 AM

## 2013-03-24 NOTE — Progress Notes (Signed)
INFECTIOUS DISEASE PROGRESS NOTE  ID: [name] is a 62 y.o. female with  Principal Problem:   Gram-negative bacteremia Active Problems:   Hypertension   Anxiety   DVT (deep venous thrombosis)   Breast cancer metastasized to multiple sites   Anemia of chronic disease  Subjective: Without complaints, up ambulating.   Abtx:  Anti-infectives   Start     Dose/Rate Route Frequency Ordered Stop   03/22/13 1400  cefTRIAXone (ROCEPHIN) 1 g in dextrose 5 % 50 mL IVPB     1 g 100 mL/hr over 30 Minutes Intravenous Every 24 hours 03/22/13 1246     03/21/13 0330  ceFEPIme (MAXIPIME) 2 g in dextrose 5 % 50 mL IVPB  Status:  Discontinued     2 g 100 mL/hr over 30 Minutes Intravenous 3 times per day 03/21/13 0246 03/22/13 1246      Medications:  Scheduled: . amLODipine  10 mg Oral q morning - 10a  . cefTRIAXone (ROCEPHIN)  IV  1 g Intravenous Q24H  . losartan  100 mg Oral Daily  . morphine  60 mg Oral BID  . polyethylene glycol  17 g Oral Daily  . potassium chloride  10 mEq Oral BID  . senna-docusate  1 tablet Oral BID  . sodium chloride  3 mL Intravenous Q12H  . spironolactone  12.5 mg Oral q morning - 10a  . Warfarin - Pharmacist Dosing Inpatient   Does not apply q1800    Objective: Vital signs in last 24 hours: Temp:  [98.1 F (36.7 C)-98.7 F (37.1 C)] 98.3 F (36.8 C) (07/29 0623) Pulse Rate:  [66-74] 74 (07/29 0623) Resp:  [16] 16 (07/29 0623) BP: (94-135)/(50-86) 121/64 mmHg (07/29 1304) SpO2:  [95 %-99 %] 99 % (07/29 0623)   General appearance: alert, cooperative and no distress  Lab Results No results found for this basename: WBC, HGB, HCT, PLATELETS, NA, K, CL, CO2, BUN, CREATININE, GLU,  in the last 72 hours Liver Panel No results found for this basename: PROT, ALBUMIN, AST, ALT, ALKPHOS, BILITOT, BILIDIR, IBILI,  in the last 72 hours Sedimentation Rate No results found for this basename: ESRSEDRATE,  in the last 72 hours C-Reactive Protein No  results found for this basename: CRP,  in the last 72 hours  Microbiology: Recent Results (from the past 240 hour(s))  URINE CULTURE     Status: None   Collection Time    03/20/13  8:21 AM      Result Value Range Status   Urine Culture, Routine Culture, Urine   Final   Comment: Final - ===== COLONY COUNT: =====     NO GROWTH     NO GROWTH  CULTURE, BLOOD (SINGLE)     Status: None   Collection Time    03/20/13  8:44 AM      Result Value Range Status   Blood Culture, Routine Culture, Blood   Final   Comment: ------------------------------------------------------------------------Gram Stain Report Called to,Read Back Byand Verified With:DR. ENNEVER ON 03/21/2013 AT 12:20A BY [email or web address] - ===== FINAL REPORT =====SERRATIA      MARCESCENS------------------------------------------------------------------------ SERRATIA MARCESCENS   CEFAZOLIN                        MIC      Resistant       >=64 ug/ml   CEFOXITIN  MIC      Resistant       >=64 ug/ml        CEFTRIAXONE                      MIC      Sensitive        <=1 ug/ml   CEFTAZIDIME                      MIC      Sensitive        <=1 ug/ml   CEFEPIME                         MIC      Sensitive        <=1 ug/ml   GENTAMICIN                       MIC           Sensitive        <=1 ug/ml   TOBRAMYCIN                       MIC      Sensitive          2 ug/ml   CIPROFLOXACIN                    MIC      Sensitive     <=0.25 ug/ml   LEVOFLOXACIN                     MIC      Sensitive          1 ug/ml        TRIMETH/SULFA                    MIC      Sensitive       <=20 ug/mlEND OF REPORT  CULTURE, BLOOD (ROUTINE X 2)     Status: None   Collection Time    03/21/13  3:07 AM      Result Value Range Status   Specimen Description BLOOD LEFT ARM   Final   Special Requests BOTTLES DRAWN AEROBIC AND ANAEROBIC 5 CC EACH   Final   Culture  Setup Time 03/21/2013 18:07   Final   Culture     Final   Value:        BLOOD CULTURE RECEIVED NO  GROWTH TO DATE CULTURE WILL BE HELD FOR 5 DAYS BEFORE ISSUING A FINAL NEGATIVE REPORT   Report Status PENDING   Incomplete  CULTURE, BLOOD (ROUTINE X 2)     Status: None   Collection Time    03/21/13  3:10 AM      Result Value Range Status   Specimen Description BLOOD RIGHT ARM   Final   Special Requests BOTTLES DRAWN AEROBIC AND ANAEROBIC 5 CC EACH   Final   Culture  Setup Time 03/21/2013 18:07   Final   Culture     Final   Value:        BLOOD CULTURE RECEIVED NO GROWTH TO DATE CULTURE WILL BE HELD FOR 5 DAYS BEFORE ISSUING A FINAL NEGATIVE REPORT   Report Status PENDING   Incomplete  CULTURE, BLOOD (SINGLE)     Status: None   Collection Time    03/22/13  5:42 AM      Result Value Range Status   Specimen Description BLOOD RIGHT ARM   Final   Special Requests BOTTLES DRAWN AEROBIC AND ANAEROBIC 5CC   Final   Culture  Setup Time 03/22/2013 15:17   Final   Culture     Final   Value:        BLOOD CULTURE RECEIVED NO GROWTH TO DATE CULTURE WILL BE HELD FOR 5 DAYS BEFORE ISSUING A FINAL NEGATIVE REPORT   Report Status PENDING   Incomplete    Studies/Results: Dg Chest 1 View  03/24/2013   *RADIOLOGY REPORT*  Clinical Data: Post IJ PICC removal  CHEST - 1 VIEW  Comparison: 03/23/2013  Findings: Interval removal of right IJ central venous line. Cardiac silhouette and mediastinal contours are unchanged.  No focal pulmonary opacities.  No pneumothorax seen.  No pleural effusions.  Surgical clips overlie the axilla bilaterally.  Bones are unchanged.  IMPRESSION:  No acute cardiopulmonary findings.   Original Report Authenticated By: Edwin Cap, M.D.     Assessment/Plan: Bacteremia- SA marcescens (R- ancef, cefoxitin)  Metastatic Breast Cancer  DVT/SVC syndrome   Would plan for 14 days of anbx. starting date removal of PIC (7/29).  Her end date of anbx will be 04/07/13 provided her repeat BCx remain (-). So far BCx 7/26 and 7/27 are ngtd.  She states she will be scheduled for a port on L  chest Would hope to defer til her anbx are completed if possible.   Total days of antibiotics: 4 (ceftriaxone)          Johny Sax Infectious Diseases (pager) [phone] [email or web address] 03/24/2013, 1:26 PM  LOS: 3 days

## 2013-03-25 ENCOUNTER — Other Ambulatory Visit (HOSPITAL_COMMUNITY): Payer: Self-pay | Admitting: Oncology

## 2013-03-25 DIAGNOSIS — C50919 Malignant neoplasm of unspecified site of unspecified female breast: Secondary | ICD-10-CM

## 2013-03-25 LAB — PROTIME-INR: INR: 1.82 — ABNORMAL HIGH (ref 0.00–1.49)

## 2013-03-25 MED ORDER — DEXTROSE 5 % IV SOLN: 1.0000 g | INTRAVENOUS | Status: DC

## 2013-03-25 MED ORDER — WARFARIN SODIUM 7.5 MG PO TABS
7.5000 mg | ORAL_TABLET | Freq: Once | ORAL | Status: AC
  Administered 2013-03-25: 7.5 mg via ORAL
  Filled 2013-03-25: qty 1

## 2013-03-25 NOTE — Discharge Summary (Signed)
Physician Discharge Summary  TWINKLE SOCKWELL WUJ:[id number] DOB: [birth year] DOA: 03/21/2013  PCP: Philemon Kingdom, MD  Admit date: 03/21/2013 Discharge date: 03/25/2013  Recommendations for Outpatient Follow-up:  1. Recommend close followup with PT/INR and adjustment of Coumadin dose at discharge. 2. Home health nurse to set up home IV versus IM Rocephin through two-week treatment course.  Discharge Diagnoses:  Principal Problem:    Gram-negative bacteremia secondary to catheter related blood stream infection Active Problems:    Hypertension    Anxiety    DVT (deep venous thrombosis)    Breast cancer metastasized to multiple sites    Anemia of chronic disease  Discharge Condition: Improved.  Diet recommendation: Low-sodium, heart healthy.  History of present illness:  [name] is an 62 y.o. female with PMH right breast cancer, bone and liver metastasis status post chemotherapy and bilateral mastectomies/bilateral axillary lymph node dissection for/13/2006, status post radiation to the chest and SVC syndrome on chronic anticoagulation with Coumadin who was admitted on 03/20/2013 secondary to Serratia bacteremia. The patient was started on cefepime on admission and is being followed by infectious disease consultants. Her PICC line has been removed and she will complete a two-week course of Rocephin per ID recommendations.  Hospital Course by problem:  Principal Problem:  *CRBSI (catheter-related bloodstream infection) with gram-negative bacteremia  -Blood cultures obtained in oncology office grew Serratia. Followup surveillance cultures negative to date.  -Started cefepime on admission, which was subsequently changed to rocephin 1 gm IV Q 24 hours (started 03/23/2013 treatment course recommended at 14 days which will be completed on 04/05/2013) per ID recommendations. Will receive home IV versus IM Rocephin through recommended treatment course. -PICC line removed 03/24/2013.   Active Problems:  Hypertension  -Controlled on Norvasc and losartan.  Anxiety  -Continue ativan 1 mg PO TID PRN.  DVT (deep venous thrombosis), SVC syndrome  -Maintained on chronic coumadin; monitor INR daily.  Breast cancer metastasized to multiple sites  -Being followed by oncology.  -Status post herceptin 03/20/2013 and next dose due 04/10/2013.  Anemia of chronic disease  -Secondary to history of malignancy.  -Hemoglobin stable with no current indication for transfusion.  Procedures:  PICC line removed 03/24/2013  Medical Consultants:  Dr. Ruthann Cancer, Oncology.  Dr. Johny Sax, ID.  Brayton El, PA-C, IR.   Discharge Exam: Filed Vitals:   03/25/13 0514  BP: 102/62  Pulse: 64  Temp: 98.8 F (37.1 C)  Resp: 16   Filed Vitals:   03/24/13 1304 03/24/13 1400 03/24/13 2100 03/25/13 0514  BP: 121/64 114/78 122/75 102/62  Pulse:  70 63 64  Temp:  98 F (36.7 C) 98.9 F (37.2 C) 98.8 F (37.1 C)  TempSrc:  Oral Oral Oral  Resp:  18 14 16   Height:      Weight:      SpO2:  99% 100% 97%    Gen:  NAD Cardiovascular:  RRR, No M/R/G Respiratory: Lungs CTAB Gastrointestinal: Abdomen soft, NT/ND with normal active bowel sounds. Extremities: No C/E/C   Discharge Instructions  Discharge Orders   Future Appointments Provider Department Dept Phone   03/26/2013 9:00 AM [facility] ECHO LAB [phone]   03/27/2013 11:30 AM Coralyn Helling, MD  Pulmonary Care [phone]   04/10/2013 8:15 AM [facility] [phone]   04/10/2013 8:45 AM [facility] [place] [facility] [phone]   04/10/2013 9:00 AM [facility] [place] [facility] [phone]   05/01/2013 8:45 AM [facility]a [facility] [phone]   05/01/2013 9:15 AM [facility] [place] [facility]  [phone]   05/22/2013 8:15 AM [facility] [phone]   05/22/2013 8:45 AM [facility] [place] [facility] [phone]   06/12/2013 8:15 AM Krista [facility] MEDICAL ONCOLOGY [phone]   06/12/2013 8:45 AM Amy [facility] [place] [facility] [phone]   06/12/2013 9:45 AM [facility] [place] [facility] [phone]   Future Orders Complete By Expires     Call MD for:  persistant nausea and vomiting  As directed     Call MD for:  severe uncontrolled pain  As directed     Call MD for:  temperature >100.4  As directed     Diet - low sodium heart healthy  As directed     Discharge instructions  As directed     Comments:      You were cared for by Dr. Hillery Aldo  (a hospitalist) during your hospital stay. If you have any questions about your discharge medications or the care you received while you were in the hospital after you are discharged, you can call the unit and ask to speak with the hospitalist on call if the hospitalist that took care of you is not available. Once you are discharged, your primary care physician will handle any further medical issues. Please note that NO REFILLS for any discharge medications will be authorized once you are discharged, as it is imperative that you return to your primary care physician (or establish a relationship with a primary care physician if you do not have one) for your aftercare needs so that they can reassess your need for medications and monitor your lab values.  Any outstanding tests can be reviewed by your PCP at your follow up visit.  It is also important to review any medicine changes with your PCP.  Please bring these d/c instructions with you to your next visit so your physician can review these changes with you.  If you do not have a primary care physician, you can call [phone] for a  physician referral.  It is highly recommended that you obtain a PCP for hospital follow up.    Discharge wound care:  As directed     Comments:      Makeup for PICC line insertion site with clean bandage in 24 hours and change daily or as needed.    Home Health  As directed     Scheduling Instructions:      For IV versus IM administration of Rocephin. Able to give IV, maintain peripheral access through treatment course duration. If unable to do IV route, may administer IM. Will need antibiotics through 04/05/2013.    Questions:      To provide the following care/treatments:  RN    Increase activity slowly  As directed         Medication List         acetaminophen 500 MG tablet  Commonly known as:  TYLENOL  Take 500 mg by mouth every 6 (six) hours as needed for pain or fever.     ALPRAZolam 1 MG tablet  Commonly known as:  XANAX  Take 1 tablet (1 mg total) by mouth 3 (three) times daily as needed for anxiety.  amLODipine 10 MG tablet  Commonly known as:  NORVASC  Take 10 mg by mouth every morning.     dextrose 5 % SOLN 50 mL with cefTRIAXone 1 G SOLR 1 g  Inject 1 g into the vein daily.     diphenhydrAMINE 25 MG tablet  Commonly known as:  BENADRYL  Take 25 mg by mouth every 8 (eight) hours as needed for allergies.     furosemide 80 MG tablet  Commonly known as:  LASIX  Take 40 mg by mouth daily as needed for fluid or edema.     losartan 100 MG tablet  Commonly known as:  COZAAR  Take 100 mg by mouth every morning.     morphine 60 MG 12 hr tablet  Commonly known as:  MS CONTIN  Take 1 tablet (60 mg total) by mouth 2 (two) times daily.     morphine 15 MG tablet  Commonly known as:  MSIR  Take 15 mg by mouth every 4 (four) hours as needed for pain.     potassium chloride 10 MEQ CR capsule  Commonly known as:  MICRO-K  Take 1 capsule (10 mEq total) by mouth 2 (two) times daily.     spironolactone 25 MG tablet  Commonly known as:  ALDACTONE  Take 12.5 mg by mouth  every morning.     VOLTAREN 1 % Gel  Generic drug:  diclofenac sodium  Take 2-4 g by mouth 4 (four) times daily as needed (pain).     warfarin 2.5 MG tablet  Commonly known as:  COUMADIN  Take 5-7.5 mg by mouth every evening. Take 7.5mg  on Monday and Wednesday. Take 5mg  each other day of the week     zolpidem 10 MG tablet  Commonly known as:  AMBIEN  Take 10 mg by mouth at bedtime as needed for sleep.           Follow-up Information   Follow up with PROCHNAU,CAROLINE, MD. Schedule an appointment as soon as possible for a visit in 1 week.   Contact information:   [address] [place] [address] [phone]       Follow up with Lowella Dell, MD. (At scheduled appt times.)    Contact information:   [address] [place] [address] [phone]        The results of significant diagnostics from this hospitalization (including imaging, microbiology, ancillary and laboratory) are listed below for reference.    Significant Diagnostic Studies: Dg Chest 1 View  03/24/2013   *RADIOLOGY REPORT*  Clinical Data: Post IJ PICC removal  CHEST - 1 VIEW  Comparison: 03/23/2013  Findings: Interval removal of right IJ central venous line. Cardiac silhouette and mediastinal contours are unchanged.  No focal pulmonary opacities.  No pneumothorax seen.  No pleural effusions.  Surgical clips overlie the axilla bilaterally.  Bones are unchanged.  IMPRESSION:  No acute cardiopulmonary findings.   Original Report Authenticated By: Edwin Cap, M.D.   Ir Removal Tun Cv Cath W/o Fl  03/24/2013   *RADIOLOGY REPORT*  Clinical Data: Patient with right IJ tunneled catheter for chronic access.  Acute bacteremia.  Request for catheter removal.  REMOVAL TUNNELED CENTRAL VENOUS CATHETER  Comparison: None.  Findings: The patient's right upper chest and catheter were prepped and draped in the normal sterile fashion.  Sutures were removed. No anesthesia was necessary. Using gentle traction,  the catheter was removed in its entirety, with an end length of 22.5 cm.  This  is not noted difference from the original procedure indicating a length of 27.5 cm.   Chest x-ray has been ordered. Tip of the catheter has been sent for culture.  Sterile occlusive dressing was applied.  No other complications  IMPRESSION: Successful removal of tunneled right IJ PICC catheter as described above  Read by Brayton El PA-C   Original Report Authenticated By: Malachy Moan, M.D.   Dg Chest Port 1 View  03/21/2013   *RADIOLOGY REPORT*  Clinical Data: Sepsis  PORTABLE CHEST - 1 VIEW  Comparison: No CT from 11/19/2011  Findings: Right IJ prior line terminates at the cavoatrial junction.  The cardiac and mediastinal silhouettes are within normal limits.  The lungs are normally inflated.  No airspace consolidation to suggest an acute infectious or aspiration pneumonitis is identified.  There is no pleural effusion, pulmonary edema, or pneumothorax.  The osseous structures are within normal limits.  IMPRESSION: No focal airspace consolidation to suggest acute infectious pneumonitis.   Original Report Authenticated By: Rise Mu, M.D.    Labs:  Basic Metabolic Panel:  Recent Labs Lab 03/20/13 [phone] 03/21/13 0310 03/21/13 0658  NA 141 138 138  K 3.9 3.5 3.6  CL  --  103 103  CO2 22 25 27   GLUCOSE 104 97 98  BUN 13.8 12 11   CREATININE 0.8 0.85 0.82  CALCIUM 9.4 9.7 9.5   GFR Estimated Creatinine Clearance: 92.1 ml/min (by C-G formula based on Cr of 0.82). Liver Function Tests:  Recent Labs Lab 03/20/13 0821  AST 10  ALT 12  ALKPHOS 75  BILITOT 0.53  PROT 8.3  ALBUMIN 3.2*   Coagulation profile  Recent Labs Lab 03/20/13 0819  03/21/13 0658 03/22/13 0542 03/23/13 0351 03/24/13 0400 03/25/13 0412  INR 2.70  < > 2.72* 2.15* 1.85* 1.75* 1.82*  PROTIME 32.4*  --   --   --   --   --   --   < > = values in this interval not displayed.  CBC:  Recent Labs Lab 03/20/13 0819  03/21/13 0310 03/21/13 0658  WBC 8.5 8.2 8.3  NEUTROABS 4.8 4.2  --   HGB 11.1* 10.9* 10.2*  HCT 32.0* 31.6* 29.3*  MCV 77.1* 75.8* 76.3*  PLT 301 279 259   Microbiology Recent Results (from the past 240 hour(s))  URINE CULTURE     Status: None   Collection Time    03/20/13  8:21 AM      Result Value Range Status   Urine Culture, Routine Culture, Urine   Final   Comment: Final - ===== COLONY COUNT: =====     NO GROWTH     NO GROWTH  CULTURE, BLOOD (SINGLE)     Status: None   Collection Time    03/20/13  8:44 AM      Result Value Range Status   Blood Culture, Routine Culture, Blood   Final   Comment: ------------------------------------------------------------------------Gram Stain Report Called to,Read Back Byand Verified With:DR. ENNEVER ON 03/21/2013 AT 12:20A BY [email or web address] - ===== FINAL REPORT =====SERRATIA      MARCESCENS------------------------------------------------------------------------ SERRATIA MARCESCENS   CEFAZOLIN                        MIC      Resistant       >=64 ug/ml   CEFOXITIN                        MIC  Resistant       >=64 ug/ml        CEFTRIAXONE                      MIC      Sensitive        <=1 ug/ml   CEFTAZIDIME                      MIC      Sensitive        <=1 ug/ml   CEFEPIME                         MIC      Sensitive        <=1 ug/ml   GENTAMICIN                       MIC           Sensitive        <=1 ug/ml   TOBRAMYCIN                       MIC      Sensitive          2 ug/ml   CIPROFLOXACIN                    MIC      Sensitive     <=0.25 ug/ml   LEVOFLOXACIN                     MIC      Sensitive          1 ug/ml        TRIMETH/SULFA                    MIC      Sensitive       <=20 ug/mlEND OF REPORT  CULTURE, BLOOD (ROUTINE X 2)     Status: None   Collection Time    03/21/13  3:07 AM      Result Value Range Status   Specimen Description BLOOD LEFT ARM   Final   Special Requests BOTTLES DRAWN AEROBIC AND ANAEROBIC 5 CC EACH   Final   Culture   Setup Time 03/21/2013 18:07   Final   Culture     Final   Value:        BLOOD CULTURE RECEIVED NO GROWTH TO DATE CULTURE WILL BE HELD FOR 5 DAYS BEFORE ISSUING A FINAL NEGATIVE REPORT   Report Status PENDING   Incomplete  CULTURE, BLOOD (ROUTINE X 2)     Status: None   Collection Time    03/21/13  3:10 AM      Result Value Range Status   Specimen Description BLOOD RIGHT ARM   Final   Special Requests BOTTLES DRAWN AEROBIC AND ANAEROBIC 5 CC EACH   Final   Culture  Setup Time 03/21/2013 18:07   Final   Culture     Final   Value:        BLOOD CULTURE RECEIVED NO GROWTH TO DATE CULTURE WILL BE HELD FOR 5 DAYS BEFORE ISSUING A FINAL NEGATIVE REPORT   Report Status PENDING   Incomplete  CULTURE, BLOOD (SINGLE)     Status: None   Collection Time    03/22/13  5:42 AM  Result Value Range Status   Specimen Description BLOOD RIGHT ARM   Final   Special Requests BOTTLES DRAWN AEROBIC AND ANAEROBIC 5CC   Final   Culture  Setup Time 03/22/2013 15:17   Final   Culture     Final   Value:        BLOOD CULTURE RECEIVED NO GROWTH TO DATE CULTURE WILL BE HELD FOR 5 DAYS BEFORE ISSUING A FINAL NEGATIVE REPORT   Report Status PENDING   Incomplete  CATH TIP CULTURE     Status: None   Collection Time    03/24/13 11:07 AM      Result Value Range Status   Specimen Description CATH TIP   Final   Special Requests NONE   Final   Culture Culture reincubated for better growth   Final   Report Status PENDING   Incomplete    Time coordinating discharge: 35 minutes.  Signed:  RAMA,CHRISTINA  Pager [phone] [facility] 03/25/2013, 10:43 AM

## 2013-03-25 NOTE — Progress Notes (Signed)
ANTICOAGULATION CONSULT NOTE - follow up  Pharmacy Consult for warfarin Indication: DVT (SVC syndrome)  Allergies  Allergen Reactions  . Adhesive (Tape) Other (See Comments)    Tears skin   . Penicillins Hives   Patient Measurements: Height: 5\' 3"  (160 cm) Weight: 273 lb 5.9 oz (124 kg) IBW/kg (Calculated) : 52.4  Labs:  Recent Labs  03/23/13 0351 03/24/13 0400 03/25/13 0412  LABPROT 20.8* 19.9* 20.5*  INR 1.85* 1.75* 1.82*   Estimated Creatinine Clearance: 92.1 ml/min (by C-G formula based on Cr of 0.82).  Medications:  Prescriptions prior to admission  Medication Sig Dispense Refill  . acetaminophen (TYLENOL) 500 MG tablet Take 500 mg by mouth every 6 (six) hours as needed for pain or fever.      . ALPRAZolam (XANAX) 1 MG tablet Take 1 tablet (1 mg total) by mouth 3 (three) times daily as needed for anxiety.  90 tablet  1  . amLODipine (NORVASC) 10 MG tablet Take 10 mg by mouth every morning.      . diphenhydrAMINE (BENADRYL) 25 MG tablet Take 25 mg by mouth every 8 (eight) hours as needed for allergies.      . furosemide (LASIX) 80 MG tablet Take 40 mg by mouth daily as needed for fluid or edema.      Marland Kitchen losartan (COZAAR) 100 MG tablet Take 100 mg by mouth every morning.       Marland Kitchen morphine (MS CONTIN) 60 MG 12 hr tablet Take 1 tablet (60 mg total) by mouth 2 (two) times daily.  60 tablet  0  . morphine (MSIR) 15 MG tablet Take 15 mg by mouth every 4 (four) hours as needed for pain.      . potassium chloride (MICRO-K) 10 MEQ CR capsule Take 1 capsule (10 mEq total) by mouth 2 (two) times daily.  30 capsule  3  . spironolactone (ALDACTONE) 25 MG tablet Take 12.5 mg by mouth every morning.      Marland Kitchen VOLTAREN 1 % GEL Take 2-4 g by mouth 4 (four) times daily as needed (pain).       Marland Kitchen warfarin (COUMADIN) 2.5 MG tablet Take 5-7.5 mg by mouth every evening. Take 7.5mg  on Monday and Wednesday. Take 5mg  each other day of the week      . zolpidem (AMBIEN) 10 MG tablet Take 10 mg by  mouth at bedtime as needed for sleep.        Assessment: 62yo F w/ breast cancer with occasional fevers for 3 weeks. Blood cultures drawn at [facility] growing GNR. Admitted for IV abx and blood cultures repeated. Cefepime started at the aggressive FN dose. Last chemo was day before admit. Pharmacy to manage chronic warfarin for DVT/SVC syndrome. INR at goal on admit so home regimen was continued and daily INRs ordered.  INR trended down below therapeutic range on home regimen.   CBC in am.  Broad-spectrum abx may increase INR response, currently on Rocephin  Tolerating diet well  PICC removed 7/29, INR was 1.75 with Lovenox held am 7/29 (no plan for [facility] placement)   Dr. Darnelle Catalan recommendend bridging with prophylactic-dose Lovenox (if removal was needed), INR still subtherapeutic this am, continue to prophylax with Lovenox. For this obese patient that would be 60mg  SQ q24h.  INR 1.82 this am, increased with larger doses Coumadin  Goal of Therapy:  INR 2-3   Plan:   Lovenox 60mg  daily until INR > 2  Coumadin 7.5mg  today  Check PT/INR daily.  Chilton Si,  Brand Males PharmD Pager [phone] 03/25/2013, 8:37 AM

## 2013-03-25 NOTE — Progress Notes (Signed)
[name]   DOB:[birth year]   RU#:[id number]   BJY#:[id number]  Subjective: PICC removed yesterday w/o event; ambulatory; agitating to go home; husband in room   Objective: middle aged African American woman examined sitting up at bedside  Filed Vitals:   03/25/13 0514  BP: 102/62  Pulse: 64  Temp: 98.8 F (37.1 C)  Resp: 16    Body mass index is 48.44 kg/(m^2).  Intake/Output Summary (Last 24 hours) at 03/25/13 0744 Last data filed at 03/24/13 1849  Gross per 24 hour  Intake    780 ml  Output      0 ml  Net    780 ml     Sclerae unicteric  Lungs clear -- no rales or rhonchi  Heart regular rate and rhythm  Abdomen soft, NT, +BS  MSK area of PICC removal shows no swelling or erythema  Neuro nonfocal    CBG (last 3)  No results found for this basename: GLUCAP,  in the last 72 hours   Labs:  Lab Results  Component Value Date   WBC 8.3 03/21/2013   HGB 10.2* 03/21/2013   HCT 29.3* 03/21/2013   MCV 76.3* 03/21/2013   PLT 259 03/21/2013   NEUTROABS 4.2 03/21/2013    @LASTCHEMISTRY @  Urine Studies No results found for this basename: UACOL, UAPR, USPG, UPH, UTP, UGL, UKET, UBIL, UHGB, UNIT, UROB, ULEU, UEPI, UWBC, URBC, UBAC, CAST, CRYS, UCOM, BILUA,  in the last 72 hours  Basic Metabolic Panel:  Recent Labs Lab 03/20/13 0821 03/21/13 0310 03/21/13 0658  NA 141 138 138  K 3.9 3.5 3.6  CL  --  103 103  CO2 22 25 27   GLUCOSE 104 97 98  BUN 13.8 12 11   CREATININE 0.8 0.85 0.82  CALCIUM 9.4 9.7 9.5   GFR Estimated Creatinine Clearance: 92.1 ml/min (by C-G formula based on Cr of 0.82). Liver Function Tests:  Recent Labs Lab 03/20/13 0821  AST 10  ALT 12  ALKPHOS 75  BILITOT 0.53  PROT 8.3  ALBUMIN 3.2*   No results found for this basename: LIPASE, AMYLASE,  in the last 168 hours No results found for this basename: AMMONIA,  in the last 168 hours Coagulation profile  Recent Labs Lab 03/20/13 0819  03/21/13 0658 03/22/13 0542 03/23/13 0351  03/24/13 0400 03/25/13 0412  INR 2.70  < > 2.72* 2.15* 1.85* 1.75* 1.82*  PROTIME 32.4*  --   --   --   --   --   --   < > = values in this interval not displayed.  CBC:  Recent Labs Lab 03/20/13 0819 03/21/13 0310 03/21/13 0658  WBC 8.5 8.2 8.3  NEUTROABS 4.8 4.2  --   HGB 11.1* 10.9* 10.2*  HCT 32.0* 31.6* 29.3*  MCV 77.1* 75.8* 76.3*  PLT 301 279 259   Cardiac Enzymes: No results found for this basename: CKTOTAL, CKMB, CKMBINDEX, TROPONINI,  in the last 168 hours BNP: No components found with this basename: POCBNP,  CBG: No results found for this basename: GLUCAP,  in the last 168 hours D-Dimer No results found for this basename: DDIMER,  in the last 72 hours Hgb A1c No results found for this basename: HGBA1C,  in the last 72 hours Lipid Profile No results found for this basename: CHOL, HDL, LDLCALC, TRIG, CHOLHDL, LDLDIRECT,  in the last 72 hours Thyroid function studies No results found for this basename: TSH, T4TOTAL, FREET3, T3FREE, THYROIDAB,  in the last 72 hours Anemia work  up No results found for this basename: VITAMINB12, FOLATE, FERRITIN, TIBC, IRON, RETICCTPCT,  in the last 72 hours Microbiology Recent Results (from the past 240 hour(s))  URINE CULTURE     Status: None   Collection Time    03/20/13  8:21 AM      Result Value Range Status   Urine Culture, Routine Culture, Urine   Final   Comment: Final - ===== COLONY COUNT: =====     NO GROWTH     NO GROWTH  CULTURE, BLOOD (SINGLE)     Status: None   Collection Time    03/20/13  8:44 AM      Result Value Range Status   Blood Culture, Routine Culture, Blood   Final   Comment: ------------------------------------------------------------------------Gram Stain Report Called to,Read Back Byand Verified With:DR. ENNEVER ON 03/21/2013 AT 12:20A BY [email or web address] - ===== FINAL REPORT =====SERRATIA      MARCESCENS------------------------------------------------------------------------ SERRATIA MARCESCENS    CEFAZOLIN                        MIC      Resistant       >=64 ug/ml   CEFOXITIN                        MIC      Resistant       >=64 ug/ml        CEFTRIAXONE                      MIC      Sensitive        <=1 ug/ml   CEFTAZIDIME                      MIC      Sensitive        <=1 ug/ml   CEFEPIME                         MIC      Sensitive        <=1 ug/ml   GENTAMICIN                       MIC           Sensitive        <=1 ug/ml   TOBRAMYCIN                       MIC      Sensitive          2 ug/ml   CIPROFLOXACIN                    MIC      Sensitive     <=0.25 ug/ml   LEVOFLOXACIN                     MIC      Sensitive          1 ug/ml        TRIMETH/SULFA                    MIC      Sensitive       <=20 ug/mlEND OF REPORT  CULTURE, BLOOD (ROUTINE X 2)     Status: None   Collection Time    03/21/13  3:07 AM      Result Value Range Status   Specimen Description BLOOD LEFT ARM   Final   Special Requests BOTTLES DRAWN AEROBIC AND ANAEROBIC 5 CC EACH   Final   Culture  Setup Time 03/21/2013 18:07   Final   Culture     Final   Value:        BLOOD CULTURE RECEIVED NO GROWTH TO DATE CULTURE WILL BE HELD FOR 5 DAYS BEFORE ISSUING A FINAL NEGATIVE REPORT   Report Status PENDING   Incomplete  CULTURE, BLOOD (ROUTINE X 2)     Status: None   Collection Time    03/21/13  3:10 AM      Result Value Range Status   Specimen Description BLOOD RIGHT ARM   Final   Special Requests BOTTLES DRAWN AEROBIC AND ANAEROBIC 5 CC EACH   Final   Culture  Setup Time 03/21/2013 18:07   Final   Culture     Final   Value:        BLOOD CULTURE RECEIVED NO GROWTH TO DATE CULTURE WILL BE HELD FOR 5 DAYS BEFORE ISSUING A FINAL NEGATIVE REPORT   Report Status PENDING   Incomplete  CULTURE, BLOOD (SINGLE)     Status: None   Collection Time    03/22/13  5:42 AM      Result Value Range Status   Specimen Description BLOOD RIGHT ARM   Final   Special Requests BOTTLES DRAWN AEROBIC AND ANAEROBIC 5CC   Final   Culture  Setup  Time 03/22/2013 15:17   Final   Culture     Final   Value:        BLOOD CULTURE RECEIVED NO GROWTH TO DATE CULTURE WILL BE HELD FOR 5 DAYS BEFORE ISSUING A FINAL NEGATIVE REPORT   Report Status PENDING   Incomplete  CATH TIP CULTURE     Status: None   Collection Time    03/24/13 11:07 AM      Result Value Range Status   Specimen Description CATH TIP   Final   Special Requests NONE   Final   Culture Culture reincubated for better growth   Final   Report Status PENDING   Incomplete      Studies:  Dg Chest 1 View  03/24/2013   *RADIOLOGY REPORT*  Clinical Data: Post IJ PICC removal  CHEST - 1 VIEW  Comparison: 03/23/2013  Findings: Interval removal of right IJ central venous line. Cardiac silhouette and mediastinal contours are unchanged.  No focal pulmonary opacities.  No pneumothorax seen.  No pleural effusions.  Surgical clips overlie the axilla bilaterally.  Bones are unchanged.  IMPRESSION:  No acute cardiopulmonary findings.   Original Report Authenticated By: Edwin Cap, M.D.   Ir Removal Tun Cv Cath W/o Fl  03/24/2013   *RADIOLOGY REPORT*  Clinical Data: Patient with right IJ tunneled catheter for chronic access.  Acute bacteremia.  Request for catheter removal.  REMOVAL TUNNELED CENTRAL VENOUS CATHETER  Comparison: None.  Findings: The patient's right upper chest and catheter were prepped and draped in the normal sterile fashion.  Sutures were removed. No anesthesia was necessary. Using gentle traction, the catheter was removed in its entirety, with an end length of 22.5 cm.  This is not noted difference from the original procedure indicating a length of 27.5 cm.   Chest x-ray has been ordered. Tip of the catheter has been sent for culture.  Sterile occlusive dressing was applied.  No other  complications  IMPRESSION: Successful removal of tunneled right IJ PICC catheter as described above  Read by Brayton El PA-C   Original Report Authenticated By: Malachy Moan, M.D.     Assessment: 61 y.o. 62 y.o. 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  (4) status post bilateral mastectomies with bilateral axillary lymph node dissection 12/07/2004, showing  (a) on the right, a mypT1c ypN1 invasive ductal carcinoma, grade 3, estrogen and progesterone receptor negative, HER-2 positive, with an MIB-1 of 31%  (b) on the left, ypT2 ypN1 invasive ductal carcinoma, grade 2, estrogen and progesterone receptor negative, HER-2 positive, with an MIB-1 of 35%.  (5) Status post radiation June through July of 2006, to the right chest wall, left chest wall, bilateral supraclavicular fossae, and bilateral axillary boosts; with additional radiation to the right and left chest walls and the central chest wall completed November of 2007  (6) status post ixempra x9 completed August of 2009.  (7) history of superior vena caval syndrome, on life long anticoagulation  (8) History of chemotherapy-induced neuropathy. On Neurontin 600 mg p.o. T.i.d., MS Contin, and MSIR. (9) refuses zolendronate or other biphosphonates (10) PICC infection with blood cultures positive for serratia marcescens, ceftriaxone +; PICC removed 03/24/2013  PLAN: Per ID note, patient needs total 14 days ceftriaxone counting from yesterday (last day will be 08/12). This can be done as outpatient. Discharge at hospitalist's discretion  Patient is scheduled for next tratuzumab dose 08/15; we will do that treatment via peripheral vein, and set her up for port placement before the  SEPT treatment after reviewing results of PICC tip and more pending blood cultures.  Appreciate your help to this patient. Please let me know if we can be of further help at this point       Lowella Dell, MD 03/25/2013  7:44 AM

## 2013-03-25 NOTE — Progress Notes (Signed)
Pt and spouse chose [facility] to provide [facility] services for IV/IM Rocephin at home. Referral made.  Algernon Huxley RN BSN  [phone]

## 2013-03-25 NOTE — Progress Notes (Signed)
03/25/13 1425  Reviewed discharge instructions with patient and husband. Both verbalized understanding. Copy of discharge instructions and prescription given to patient. Patient IV in right hand was wrapped in curlex prior to discharge to be used by home health RN for further antibiotic treatments.

## 2013-03-26 ENCOUNTER — Ambulatory Visit (HOSPITAL_COMMUNITY): Payer: Medicare Other

## 2013-03-26 ENCOUNTER — Encounter: Payer: Self-pay | Admitting: *Deleted

## 2013-03-27 ENCOUNTER — Institutional Professional Consult (permissible substitution): Payer: Medicare Other | Admitting: Pulmonary Disease

## 2013-03-27 LAB — CULTURE, BLOOD (ROUTINE X 2)
Culture: NO GROWTH
Culture: NO GROWTH

## 2013-03-28 LAB — CULTURE, BLOOD (SINGLE)

## 2013-03-31 ENCOUNTER — Encounter: Payer: Self-pay | Admitting: Pulmonary Disease

## 2013-04-08 ENCOUNTER — Other Ambulatory Visit (HOSPITAL_COMMUNITY): Payer: Self-pay | Admitting: Oncology

## 2013-04-08 DIAGNOSIS — I10 Essential (primary) hypertension: Secondary | ICD-10-CM

## 2013-04-08 DIAGNOSIS — C50919 Malignant neoplasm of unspecified site of unspecified female breast: Secondary | ICD-10-CM

## 2013-04-09 ENCOUNTER — Other Ambulatory Visit: Payer: Self-pay | Admitting: *Deleted

## 2013-04-09 DIAGNOSIS — I10 Essential (primary) hypertension: Secondary | ICD-10-CM

## 2013-04-09 DIAGNOSIS — C50919 Malignant neoplasm of unspecified site of unspecified female breast: Secondary | ICD-10-CM

## 2013-04-09 MED ORDER — FUROSEMIDE 80 MG PO TABS: 80.0000 mg | ORAL_TABLET | Freq: Every day | ORAL | Status: DC

## 2013-04-09 MED ORDER — CYCLOBENZAPRINE HCL 10 MG PO TABS: 10.0000 mg | ORAL_TABLET | Freq: Three times a day (TID) | ORAL | Status: DC | PRN

## 2013-04-10 ENCOUNTER — Ambulatory Visit (HOSPITAL_BASED_OUTPATIENT_CLINIC_OR_DEPARTMENT_OTHER): Payer: Medicare HMO

## 2013-04-10 ENCOUNTER — Encounter (HOSPITAL_COMMUNITY): Payer: Self-pay | Admitting: *Deleted

## 2013-04-10 ENCOUNTER — Other Ambulatory Visit: Payer: Self-pay | Admitting: Medical Oncology

## 2013-04-10 ENCOUNTER — Other Ambulatory Visit: Payer: Self-pay | Admitting: Oncology

## 2013-04-10 ENCOUNTER — Ambulatory Visit: Payer: Medicare Other | Admitting: Pharmacist

## 2013-04-10 ENCOUNTER — Other Ambulatory Visit: Payer: Self-pay | Admitting: Hematology and Oncology

## 2013-04-10 ENCOUNTER — Ambulatory Visit (HOSPITAL_BASED_OUTPATIENT_CLINIC_OR_DEPARTMENT_OTHER): Payer: Medicare HMO | Admitting: Lab

## 2013-04-10 VITALS — BP 136/94 | HR 67 | Temp 98.5°F | Resp 16

## 2013-04-10 DIAGNOSIS — C50919 Malignant neoplasm of unspecified site of unspecified female breast: Secondary | ICD-10-CM

## 2013-04-10 DIAGNOSIS — Z7901 Long term (current) use of anticoagulants: Secondary | ICD-10-CM

## 2013-04-10 DIAGNOSIS — I871 Compression of vein: Secondary | ICD-10-CM

## 2013-04-10 DIAGNOSIS — I509 Heart failure, unspecified: Secondary | ICD-10-CM

## 2013-04-10 DIAGNOSIS — I82409 Acute embolism and thrombosis of unspecified deep veins of unspecified lower extremity: Secondary | ICD-10-CM

## 2013-04-10 DIAGNOSIS — C7952 Secondary malignant neoplasm of bone marrow: Secondary | ICD-10-CM

## 2013-04-10 DIAGNOSIS — I10 Essential (primary) hypertension: Secondary | ICD-10-CM

## 2013-04-10 DIAGNOSIS — Z5112 Encounter for antineoplastic immunotherapy: Secondary | ICD-10-CM

## 2013-04-10 DIAGNOSIS — C787 Secondary malignant neoplasm of liver and intrahepatic bile duct: Secondary | ICD-10-CM

## 2013-04-10 DIAGNOSIS — D638 Anemia in other chronic diseases classified elsewhere: Secondary | ICD-10-CM

## 2013-04-10 DIAGNOSIS — G629 Polyneuropathy, unspecified: Secondary | ICD-10-CM

## 2013-04-10 DIAGNOSIS — Z86718 Personal history of other venous thrombosis and embolism: Secondary | ICD-10-CM

## 2013-04-10 DIAGNOSIS — R7881 Bacteremia: Secondary | ICD-10-CM

## 2013-04-10 DIAGNOSIS — F419 Anxiety disorder, unspecified: Secondary | ICD-10-CM

## 2013-04-10 LAB — CBC WITH DIFFERENTIAL/PLATELET
BASO%: 0.2 % (ref 0.0–2.0)
HCT: 34.4 % — ABNORMAL LOW (ref 34.8–46.6)
MCHC: 34.3 g/dL (ref 31.5–36.0)
MONO#: 0.5 10*3/uL (ref 0.1–0.9)
NEUT%: 56.9 % (ref 38.4–76.8)
WBC: 9 10*3/uL (ref 3.9–10.3)
lymph#: 3.2 10*3/uL (ref 0.9–3.3)
nRBC: 0 % (ref 0–0)

## 2013-04-10 LAB — COMPREHENSIVE METABOLIC PANEL (CC13)
ALT: 12 U/L (ref 0–55)
AST: 13 U/L (ref 5–34)
Alkaline Phosphatase: 96 U/L (ref 40–150)
Chloride: 110 mEq/L — ABNORMAL HIGH (ref 98–109)
Creatinine: 0.8 mg/dL (ref 0.6–1.1)
Total Bilirubin: 0.44 mg/dL (ref 0.20–1.20)

## 2013-04-10 LAB — PROTIME-INR
INR: 3.5 (ref 2.00–3.50)
Protime: 42 Seconds — ABNORMAL HIGH (ref 10.6–13.4)

## 2013-04-10 MED ORDER — DIPHENHYDRAMINE HCL 25 MG PO CAPS
50.0000 mg | ORAL_CAPSULE | Freq: Once | ORAL | Status: AC
  Administered 2013-04-10: 50 mg via ORAL

## 2013-04-10 MED ORDER — SODIUM CHLORIDE 0.9 % IV SOLN
Freq: Once | INTRAVENOUS | Status: AC
  Administered 2013-04-10: 09:00:00 via INTRAVENOUS

## 2013-04-10 MED ORDER — LORAZEPAM 2 MG/ML IJ SOLN
1.0000 mg | Freq: Once | INTRAMUSCULAR | Status: AC | PRN
  Administered 2013-04-10: 1 mg via INTRAVENOUS

## 2013-04-10 MED ORDER — TRASTUZUMAB CHEMO INJECTION 440 MG
6.0000 mg/kg | Freq: Once | INTRAVENOUS | Status: AC
  Administered 2013-04-10: 756 mg via INTRAVENOUS
  Filled 2013-04-10: qty 36

## 2013-04-10 MED ORDER — ACETAMINOPHEN 325 MG PO TABS
650.0000 mg | ORAL_TABLET | Freq: Once | ORAL | Status: AC
  Administered 2013-04-10: 650 mg via ORAL

## 2013-04-10 NOTE — Progress Notes (Signed)
INR slightly above goal today. This may be due to recent antibiotic treatment. Pt was on Rocephin daily x 14 days for Serratia bacteremia. She completed her 14-day course on 04/05/13. No other changes in medications. No changes in diet. No missed coumadin doses. No problems to report related to anticoagulation. Pt will take Coumadin 2.5mg  (instead of 5mg ) today.  On 04/11/13, continue 5mg  daily except 7.5mg  on M&W.  Recheck INR with next Herceptin treatment on 05/01/13: lab at 8:45am, treatment at 9:15am and coumadin clinic at 9:30am.

## 2013-04-10 NOTE — Patient Instructions (Addendum)
[facility] Discharge Instructions for Patients Receiving Chemotherapy  Today you received the following chemotherapy agents: herceptin  To help prevent nausea and vomiting after your treatment, we encourage you to take your nausea medication.  Take it as often as prescribed.     If you develop nausea and vomiting that is not controlled by your nausea medication, call the clinic. If it is after clinic hours your family physician or the after hours number for the clinic or go to the Emergency Department.   BELOW ARE SYMPTOMS THAT SHOULD BE REPORTED IMMEDIATELY:  *FEVER GREATER THAN 100.5 F  *CHILLS WITH OR WITHOUT FEVER  NAUSEA AND VOMITING THAT IS NOT CONTROLLED WITH YOUR NAUSEA MEDICATION  *UNUSUAL SHORTNESS OF BREATH  *UNUSUAL BRUISING OR BLEEDING  TENDERNESS IN MOUTH AND THROAT WITH OR WITHOUT PRESENCE OF ULCERS  *URINARY PROBLEMS  *BOWEL PROBLEMS  UNUSUAL RASH Items with * indicate a potential emergency and should be followed up as soon as possible.  Feel free to call the clinic you have any questions or concerns. The clinic phone number is [phone].   I have been informed and understand all the instructions given to me. I know to contact the clinic, my physician, or go to the Emergency Department if any problems should occur. I do not have any questions at this time, but understand that I may call the clinic during office hours   should I have any questions or need assistance in obtaining follow up care.    __________________________________________  _____________  __________ Signature of Patient or Authorized Representative            Date                   Time    __________________________________________ Nurse's Signature    

## 2013-04-10 NOTE — Patient Instructions (Addendum)
Take Coumadin 2.5mg  today.  On 04/11/13, continue 5mg  daily except 7.5mg  on M&W.  Recheck INR with next Herceptin treatment on 05/01/13: lab at 8:45am, treatment at 9:15am and coumadin clinic at 9:30am.

## 2013-04-12 ENCOUNTER — Other Ambulatory Visit: Payer: Self-pay | Admitting: Oncology

## 2013-04-12 DIAGNOSIS — C50919 Malignant neoplasm of unspecified site of unspecified female breast: Secondary | ICD-10-CM

## 2013-04-13 ENCOUNTER — Telehealth: Payer: Self-pay | Admitting: *Deleted

## 2013-04-13 ENCOUNTER — Encounter: Payer: Self-pay | Admitting: Pharmacist

## 2013-04-13 NOTE — Progress Notes (Signed)
Faxed INR results and copy of AVS from 04/10/13 to Dr. Gwen Pounds office at [facility], [place] per pt request. Received fax confirmation. Dr. Donita Brooks fax: [phone] Dr. Donita Brooks phone: [phone]

## 2013-04-13 NOTE — Telephone Encounter (Signed)
sw pt gv appt for an echo for 04/17/13 @ 10am. Pt is aware but made an fuss because she's not seeing Bensimhon after her echo. I sw GCM he informed me to tell her that she cn call and schedule herself an appt with Bensimhon. Pt is aware and plans to call for an appt...td

## 2013-04-17 ENCOUNTER — Ambulatory Visit (HOSPITAL_COMMUNITY)
Admission: RE | Admit: 2013-04-17 | Discharge: 2013-04-17 | Disposition: A | Discharge status: Home / Self Care | Payer: Medicare HMO | Source: Ambulatory Visit | Attending: Oncology | Admitting: Oncology

## 2013-04-17 DIAGNOSIS — Z09 Encounter for follow-up examination after completed treatment for conditions other than malignant neoplasm: Secondary | ICD-10-CM

## 2013-04-17 DIAGNOSIS — I509 Heart failure, unspecified: Secondary | ICD-10-CM | POA: Insufficient documentation

## 2013-04-17 DIAGNOSIS — I10 Essential (primary) hypertension: Secondary | ICD-10-CM | POA: Insufficient documentation

## 2013-04-17 DIAGNOSIS — C50919 Malignant neoplasm of unspecified site of unspecified female breast: Secondary | ICD-10-CM | POA: Insufficient documentation

## 2013-04-17 NOTE — Progress Notes (Signed)
  Echocardiogram 2D Echocardiogram has been performed.  [name] FRANCES 04/17/2013, 11:15 AM

## 2013-05-01 ENCOUNTER — Other Ambulatory Visit (HOSPITAL_BASED_OUTPATIENT_CLINIC_OR_DEPARTMENT_OTHER): Payer: Medicare HMO | Admitting: Lab

## 2013-05-01 ENCOUNTER — Other Ambulatory Visit: Payer: Self-pay | Admitting: Oncology

## 2013-05-01 ENCOUNTER — Ambulatory Visit (HOSPITAL_BASED_OUTPATIENT_CLINIC_OR_DEPARTMENT_OTHER): Payer: Medicare HMO

## 2013-05-01 ENCOUNTER — Ambulatory Visit: Payer: Commercial Managed Care - HMO | Admitting: Pharmacist

## 2013-05-01 VITALS — BP 157/91 | HR 78 | Temp 98.4°F | Resp 20

## 2013-05-01 DIAGNOSIS — C50919 Malignant neoplasm of unspecified site of unspecified female breast: Secondary | ICD-10-CM

## 2013-05-01 DIAGNOSIS — C7951 Secondary malignant neoplasm of bone: Secondary | ICD-10-CM

## 2013-05-01 DIAGNOSIS — Z5112 Encounter for antineoplastic immunotherapy: Secondary | ICD-10-CM

## 2013-05-01 DIAGNOSIS — C787 Secondary malignant neoplasm of liver and intrahepatic bile duct: Secondary | ICD-10-CM

## 2013-05-01 DIAGNOSIS — Z86718 Personal history of other venous thrombosis and embolism: Secondary | ICD-10-CM

## 2013-05-01 DIAGNOSIS — I871 Compression of vein: Secondary | ICD-10-CM

## 2013-05-01 LAB — COMPREHENSIVE METABOLIC PANEL (CC13)
AST: 13 U/L (ref 5–34)
Albumin: 3.2 g/dL — ABNORMAL LOW (ref 3.5–5.0)
BUN: 12.8 mg/dL (ref 7.0–26.0)
Calcium: 9.4 mg/dL (ref 8.4–10.4)
Chloride: 110 mEq/L — ABNORMAL HIGH (ref 98–109)
Glucose: 93 mg/dl (ref 70–140)
Potassium: 4.3 mEq/L (ref 3.5–5.1)
Sodium: 141 mEq/L (ref 136–145)
Total Protein: 8.1 g/dL (ref 6.4–8.3)

## 2013-05-01 LAB — CBC WITH DIFFERENTIAL/PLATELET
Basophils Absolute: 0 10*3/uL (ref 0.0–0.1)
EOS%: 2.7 % (ref 0.0–7.0)
Eosinophils Absolute: 0.3 10*3/uL (ref 0.0–0.5)
HCT: 33.9 % — ABNORMAL LOW (ref 34.8–46.6)
HGB: 11.7 g/dL (ref 11.6–15.9)
MCH: 27.5 pg (ref 25.1–34.0)
MCV: 79.6 fL (ref 79.5–101.0)
MONO%: 6.9 % (ref 0.0–14.0)
NEUT%: 61.2 % (ref 38.4–76.8)
Platelets: 233 10*3/uL (ref 145–400)

## 2013-05-01 LAB — PROTIME-INR: INR: 1.3 — ABNORMAL LOW (ref 2.00–3.50)

## 2013-05-01 MED ORDER — ACETAMINOPHEN 325 MG PO TABS
650.0000 mg | ORAL_TABLET | Freq: Once | ORAL | Status: AC
  Administered 2013-05-01: 650 mg via ORAL

## 2013-05-01 MED ORDER — DIPHENHYDRAMINE HCL 25 MG PO CAPS
50.0000 mg | ORAL_CAPSULE | Freq: Once | ORAL | Status: AC
  Administered 2013-05-01: 50 mg via ORAL

## 2013-05-01 MED ORDER — LORAZEPAM 2 MG/ML IJ SOLN
1.0000 mg | Freq: Once | INTRAMUSCULAR | Status: AC | PRN
  Administered 2013-05-01: 1 mg via INTRAVENOUS

## 2013-05-01 MED ORDER — TRASTUZUMAB CHEMO INJECTION 440 MG
6.0000 mg/kg | Freq: Once | INTRAVENOUS | Status: AC
  Administered 2013-05-01: 756 mg via INTRAVENOUS
  Filled 2013-05-01: qty 36

## 2013-05-01 MED ORDER — LORAZEPAM 2 MG/ML IJ SOLN
INTRAMUSCULAR | Status: AC
  Filled 2013-05-01: qty 1

## 2013-05-01 MED ORDER — SODIUM CHLORIDE 0.9 % IV SOLN
Freq: Once | INTRAVENOUS | Status: AC
  Administered 2013-05-01: 10:00:00 via INTRAVENOUS

## 2013-05-01 NOTE — Patient Instructions (Addendum)
[facility] Discharge Instructions for Patients Receiving Chemotherapy  Today you received the following chemotherapy agents: Herceptin   To help prevent nausea and vomiting after your treatment, we encourage you to take your nausea medication as directed by your physician.   If you develop nausea and vomiting that is not controlled by your nausea medication, call the clinic.   BELOW ARE SYMPTOMS THAT SHOULD BE REPORTED IMMEDIATELY:  *FEVER GREATER THAN 100.5 F  *CHILLS WITH OR WITHOUT FEVER  NAUSEA AND VOMITING THAT IS NOT CONTROLLED WITH YOUR NAUSEA MEDICATION  *UNUSUAL SHORTNESS OF BREATH  *UNUSUAL BRUISING OR BLEEDING  TENDERNESS IN MOUTH AND THROAT WITH OR WITHOUT PRESENCE OF ULCERS  *URINARY PROBLEMS  *BOWEL PROBLEMS  UNUSUAL RASH Items with * indicate a potential emergency and should be followed up as soon as possible.  Feel free to call the clinic you have any questions or concerns. The clinic phone number is [phone].    

## 2013-05-01 NOTE — Progress Notes (Signed)
PT seen during infusion today.   INR=1.3 Pt confesses her diet has included "lots of greens" with no other changes to report. I told her greens are fine as long as she is consistent with intake. I explained our dose adjustments are assuming her regular amount of VIT K intakes. She needs to let us know if there are any drastic changes. Take Coumadin 7.5mg  today and tomorrow. Then continue 5mg  daily except 7.5mg  on M, W, F. Recheck INR on 05/11/13: lab at 8:30am, treatment at 8:45am.

## 2013-05-01 NOTE — Patient Instructions (Addendum)
Take Coumadin 7.5mg  today and tomorrow. Then continue 5mg  daily except 7.5mg  on M, W, F. Recheck INR on 05/11/13: lab at 8:30am, treatment at 8:45am.

## 2013-05-05 ENCOUNTER — Other Ambulatory Visit: Payer: Self-pay | Admitting: Oncology

## 2013-05-07 ENCOUNTER — Telehealth: Payer: Self-pay | Admitting: Pharmacist

## 2013-05-07 NOTE — Telephone Encounter (Signed)
Received communication from Dr. Darrall Dears RN. Pt is scheduled for port placement on 05/14/13. Ok per Dr. Darnelle Catalan to hold coumadin for 5 days prior to port placement on 05/14/13. Reviewed plan with pt by phone. Take last dose of coumadin on 05/08/13 Hold Coumadin 05/09/13 - 05/13/13 (5 days). Will cancel lab/CC apt for 05/11/13. Port placement on 05/14/13. Resume coumadin at usual dose (5mg  daily except 7.5mg  on MWF) in the evening on 05/14/13. Will recheck INR with scheduled lab and Herceptin treatment on 05/22/13

## 2013-05-11 ENCOUNTER — Other Ambulatory Visit: Payer: Medicare HMO | Admitting: Lab

## 2013-05-11 ENCOUNTER — Other Ambulatory Visit: Payer: Self-pay | Admitting: Radiology

## 2013-05-11 ENCOUNTER — Ambulatory Visit: Payer: Medicare HMO

## 2013-05-12 ENCOUNTER — Encounter (HOSPITAL_COMMUNITY): Payer: Self-pay | Admitting: Pharmacy Technician

## 2013-05-13 ENCOUNTER — Other Ambulatory Visit: Payer: Self-pay | Admitting: *Deleted

## 2013-05-13 MED ORDER — MORPHINE SULFATE 15 MG PO TABS: 15.0000 mg | ORAL_TABLET | ORAL | Status: DC | PRN

## 2013-05-13 MED ORDER — MORPHINE SULFATE ER 60 MG PO TBCR: 60.0000 mg | EXTENDED_RELEASE_TABLET | Freq: Two times a day (BID) | ORAL | Status: DC

## 2013-05-14 ENCOUNTER — Other Ambulatory Visit (HOSPITAL_COMMUNITY): Payer: Self-pay | Admitting: Oncology

## 2013-05-14 ENCOUNTER — Encounter (HOSPITAL_COMMUNITY): Payer: Self-pay

## 2013-05-14 ENCOUNTER — Ambulatory Visit (HOSPITAL_COMMUNITY)
Admission: RE | Admit: 2013-05-14 | Discharge: 2013-05-14 | Disposition: A | Discharge status: Home / Self Care | Payer: Medicare HMO | Source: Ambulatory Visit | Attending: Oncology | Admitting: Oncology

## 2013-05-14 DIAGNOSIS — C7800 Secondary malignant neoplasm of unspecified lung: Secondary | ICD-10-CM | POA: Insufficient documentation

## 2013-05-14 DIAGNOSIS — C50919 Malignant neoplasm of unspecified site of unspecified female breast: Secondary | ICD-10-CM

## 2013-05-14 DIAGNOSIS — C787 Secondary malignant neoplasm of liver and intrahepatic bile duct: Secondary | ICD-10-CM | POA: Insufficient documentation

## 2013-05-14 DIAGNOSIS — I10 Essential (primary) hypertension: Secondary | ICD-10-CM | POA: Insufficient documentation

## 2013-05-14 LAB — PROTIME-INR: INR: 1.1 (ref 0.00–1.49)

## 2013-05-14 LAB — CBC
Platelets: 280 10*3/uL (ref 150–400)
RDW: 15.2 % (ref 11.5–15.5)
WBC: 9.5 10*3/uL (ref 4.0–10.5)

## 2013-05-14 LAB — APTT: aPTT: 29 seconds (ref 24–37)

## 2013-05-14 MED ORDER — FENTANYL CITRATE 0.05 MG/ML IJ SOLN
INTRAMUSCULAR | Status: AC
  Filled 2013-05-14: qty 4

## 2013-05-14 MED ORDER — FENTANYL CITRATE 0.05 MG/ML IJ SOLN
INTRAMUSCULAR | Status: AC | PRN
  Administered 2013-05-14: 50 ug via INTRAVENOUS
  Administered 2013-05-14: 100 ug via INTRAVENOUS
  Administered 2013-05-14: 50 ug via INTRAVENOUS

## 2013-05-14 MED ORDER — VANCOMYCIN HCL 10 G IV SOLR
1500.0000 mg | INTRAVENOUS | Status: AC
  Administered 2013-05-14: 1500 mg via INTRAVENOUS
  Filled 2013-05-14: qty 1500

## 2013-05-14 MED ORDER — MIDAZOLAM HCL 2 MG/2ML IJ SOLN
INTRAMUSCULAR | Status: AC
  Filled 2013-05-14: qty 4

## 2013-05-14 MED ORDER — LIDOCAINE HCL 1 % IJ SOLN
INTRAMUSCULAR | Status: AC
  Filled 2013-05-14: qty 20

## 2013-05-14 MED ORDER — HEPARIN SOD (PORK) LOCK FLUSH 100 UNIT/ML IV SOLN: 500.0000 [IU] | Freq: Once | INTRAVENOUS | Status: DC

## 2013-05-14 MED ORDER — MIDAZOLAM HCL 2 MG/2ML IJ SOLN
INTRAMUSCULAR | Status: AC | PRN
Start: 2013-05-14 — End: 2013-05-14
  Administered 2013-05-14 (×2): 2 mg via INTRAVENOUS

## 2013-05-14 MED ORDER — SODIUM CHLORIDE 0.9 % IV SOLN
INTRAVENOUS | Status: DC
  Administered 2013-05-14: 10:00:00 via INTRAVENOUS

## 2013-05-14 NOTE — H&P (Signed)
[name] is an 62 y.o. female.   Chief Complaint: History of Breast Cancer with metastasis to liver and lung Has had tunneled IJ PICC which became infected and removed 03/21/2013 Scheduled now for [facility] a Cath placement Need for chemo and IV access  HPI: Breast ca; HTN; SVC syndrome  Past Medical History  Diagnosis Date  . Breast cancer     mets to liver and lung  . Hypertension   . SVC syndrome   . History of chemotherapy Feb. 2006    taxotere/herceptin/carboplatin  . Radiation 07/31/2006    left upper chest  . Radiation 06/17/2006-06/27/2006    6480 cGy bilat. chest wall  . Neuropathy   . Thrombosis   . Breast cancer metastasized to multiple sites 02/26/2013    Past Surgical History  Procedure Laterality Date  . Tubal ligation  1986  . Cholecystectomy  1989  . Mastectomy Bilateral   . Ankle surgery    . Peripherally inserted central catheter insertion    . Back surgery      Family History  Problem Relation Age of Onset  . Heart failure Father   . Cancer Father     Prostate cancer  . Heart failure Brother   . Cancer Brother     Prostate cancer  . Diabetes Maternal Aunt    Social History:  reports that she has never smoked. She has never used smokeless tobacco. She reports that  drinks alcohol. She reports that she does not use illicit drugs.  Allergies:  Allergies  Allergen Reactions  . Adhesive [Tape] Other (See Comments)    Tears skin   . Penicillins Hives     (Not in a hospital admission)  Results for orders placed during the hospital encounter of 05/14/13 (from the past 48 hour(s))  CBC     Status: Abnormal   Collection Time    05/14/13 10:15 AM      Result Value Range   WBC 9.5  4.0 - 10.5 K/uL   RBC 4.55  3.87 - 5.11 MIL/uL   Hemoglobin 12.5  12.0 - 15.0 g/dL   HCT 95.6 (*) 21.3 - 08.6 %   MCV 78.9  78.0 - 100.0 fL   MCH 27.5  26.0 - 34.0 pg   MCHC 34.8  30.0 - 36.0 g/dL   RDW 57.8  46.9 - 62.9 %   Platelets 280  150 - 400 K/uL   No  results found.  Review of Systems  Constitutional: Negative for fever.  Respiratory: Negative for shortness of breath.   Cardiovascular: Negative for chest pain.  Gastrointestinal: Negative for nausea, vomiting and abdominal pain.  Neurological: Positive for weakness. Negative for dizziness and headaches.  Psychiatric/Behavioral: Negative for substance abuse.    Blood pressure 147/78, pulse 80, temperature 98.6 F (37 C), temperature source Oral, resp. rate 18, SpO2 97.00%. Physical Exam  Constitutional: She is oriented to person, place, and time. She appears well-nourished.  Cardiovascular: Normal rate, regular rhythm and normal heart sounds.   No murmur heard. Respiratory: Effort normal and breath sounds normal. She has no wheezes.  GI: Soft. Bowel sounds are normal. There is no tenderness.  Musculoskeletal: Normal range of motion.  Neurological: She is alert and oriented to person, place, and time. Coordination normal.  Skin: Skin is warm and dry.  Psychiatric: She has a normal mood and affect. Her behavior is normal. Judgment and thought content normal.     Assessment/Plan Breast Ca Recent IJ tunneled PICC removed  post infection 03/21/13 Now scheduled for [place]- need for chemo and access Pt aware of procedure benefits and risks and agreeable to proceed Consent signed and in chart  Teffany Blaszczyk A 05/14/2013, 10:39 AM

## 2013-05-14 NOTE — Procedures (Signed)
R EJ Powerport placed No complication No blood loss. See complete dictation in [facility].

## 2013-05-22 ENCOUNTER — Other Ambulatory Visit: Payer: Self-pay | Admitting: Oncology

## 2013-05-22 ENCOUNTER — Other Ambulatory Visit (HOSPITAL_BASED_OUTPATIENT_CLINIC_OR_DEPARTMENT_OTHER): Payer: Medicare HMO | Admitting: Lab

## 2013-05-22 ENCOUNTER — Ambulatory Visit: Payer: Commercial Managed Care - HMO | Admitting: Pharmacist

## 2013-05-22 ENCOUNTER — Other Ambulatory Visit: Payer: Self-pay | Admitting: *Deleted

## 2013-05-22 ENCOUNTER — Ambulatory Visit (HOSPITAL_BASED_OUTPATIENT_CLINIC_OR_DEPARTMENT_OTHER): Payer: Medicare HMO

## 2013-05-22 VITALS — BP 153/95 | HR 86 | Temp 98.0°F | Resp 20

## 2013-05-22 DIAGNOSIS — Z5112 Encounter for antineoplastic immunotherapy: Secondary | ICD-10-CM

## 2013-05-22 DIAGNOSIS — C50919 Malignant neoplasm of unspecified site of unspecified female breast: Secondary | ICD-10-CM

## 2013-05-22 DIAGNOSIS — I871 Compression of vein: Secondary | ICD-10-CM

## 2013-05-22 DIAGNOSIS — C787 Secondary malignant neoplasm of liver and intrahepatic bile duct: Secondary | ICD-10-CM

## 2013-05-22 DIAGNOSIS — C7951 Secondary malignant neoplasm of bone: Secondary | ICD-10-CM

## 2013-05-22 LAB — COMPREHENSIVE METABOLIC PANEL (CC13)
Alkaline Phosphatase: 94 U/L (ref 40–150)
Creatinine: 0.8 mg/dL (ref 0.6–1.1)
Glucose: 99 mg/dl (ref 70–140)
Sodium: 143 mEq/L (ref 136–145)
Total Bilirubin: 0.36 mg/dL (ref 0.20–1.20)
Total Protein: 8.1 g/dL (ref 6.4–8.3)

## 2013-05-22 LAB — PROTIME-INR: Protime: 18 Seconds — ABNORMAL HIGH (ref 10.6–13.4)

## 2013-05-22 LAB — CBC WITH DIFFERENTIAL/PLATELET
BASO%: 1.3 % (ref 0.0–2.0)
LYMPH%: 28.3 % (ref 14.0–49.7)
MCHC: 34.3 g/dL (ref 31.5–36.0)
MONO#: 0.7 10*3/uL (ref 0.1–0.9)
Platelets: 233 10*3/uL (ref 145–400)
RBC: 4.29 10*6/uL (ref 3.70–5.45)
WBC: 10.4 10*3/uL — ABNORMAL HIGH (ref 3.9–10.3)
lymph#: 3 10*3/uL (ref 0.9–3.3)

## 2013-05-22 MED ORDER — HEPARIN SOD (PORK) LOCK FLUSH 100 UNIT/ML IV SOLN
500.0000 [IU] | Freq: Once | INTRAVENOUS | Status: AC | PRN
  Administered 2013-05-22: 500 [IU]
  Filled 2013-05-22: qty 5

## 2013-05-22 MED ORDER — LORAZEPAM 2 MG/ML IJ SOLN
INTRAMUSCULAR | Status: AC
  Filled 2013-05-22: qty 1

## 2013-05-22 MED ORDER — MORPHINE SULFATE ER 30 MG PO TBCR: 60.0000 mg | EXTENDED_RELEASE_TABLET | Freq: Two times a day (BID) | ORAL | Status: DC

## 2013-05-22 MED ORDER — SODIUM CHLORIDE 0.9 % IV SOLN
Freq: Once | INTRAVENOUS | Status: AC
  Administered 2013-05-22: 09:00:00 via INTRAVENOUS

## 2013-05-22 MED ORDER — SODIUM CHLORIDE 0.9 % IJ SOLN
10.0000 mL | INTRAMUSCULAR | Status: DC | PRN
  Administered 2013-05-22: 10 mL
  Filled 2013-05-22: qty 10

## 2013-05-22 MED ORDER — TRASTUZUMAB CHEMO INJECTION 440 MG
6.0000 mg/kg | Freq: Once | INTRAVENOUS | Status: AC
  Administered 2013-05-22: 756 mg via INTRAVENOUS
  Filled 2013-05-22: qty 36

## 2013-05-22 MED ORDER — DIPHENHYDRAMINE HCL 25 MG PO CAPS
50.0000 mg | ORAL_CAPSULE | Freq: Once | ORAL | Status: AC
  Administered 2013-05-22: 50 mg via ORAL

## 2013-05-22 MED ORDER — LORAZEPAM 2 MG/ML IJ SOLN
1.0000 mg | Freq: Once | INTRAMUSCULAR | Status: AC | PRN
  Administered 2013-05-22: 1 mg via INTRAVENOUS

## 2013-05-22 MED ORDER — ACETAMINOPHEN 325 MG PO TABS
650.0000 mg | ORAL_TABLET | Freq: Once | ORAL | Status: AC
  Administered 2013-05-22: 650 mg via ORAL

## 2013-05-22 MED ORDER — DIPHENHYDRAMINE HCL 25 MG PO CAPS
ORAL_CAPSULE | ORAL | Status: AC
  Filled 2013-05-22: qty 2

## 2013-05-22 MED ORDER — ACETAMINOPHEN 325 MG PO TABS
ORAL_TABLET | ORAL | Status: AC
  Filled 2013-05-22: qty 2

## 2013-05-22 NOTE — Patient Instructions (Signed)
[facility] Discharge Instructions for Patients Receiving Chemotherapy  Today you received the following chemotherapy agents Herceptin.  To help prevent nausea and vomiting after your treatment, we encourage you to take your nausea medication as prescribed.   If you develop nausea and vomiting that is not controlled by your nausea medication, call the clinic.   BELOW ARE SYMPTOMS THAT SHOULD BE REPORTED IMMEDIATELY:  *FEVER GREATER THAN 100.5 F  *CHILLS WITH OR WITHOUT FEVER  NAUSEA AND VOMITING THAT IS NOT CONTROLLED WITH YOUR NAUSEA MEDICATION  *UNUSUAL SHORTNESS OF BREATH  *UNUSUAL BRUISING OR BLEEDING  TENDERNESS IN MOUTH AND THROAT WITH OR WITHOUT PRESENCE OF ULCERS  *URINARY PROBLEMS  *BOWEL PROBLEMS  UNUSUAL RASH Items with * indicate a potential emergency and should be followed up as soon as possible.  Feel free to call the clinic you have any questions or concerns. The clinic phone number is [phone].    

## 2013-05-22 NOTE — Progress Notes (Signed)
Patient states she is having issues with her home pharmacy. She was given rx morphine 60 mg (1 tablet) BID on 05/14/13 and took it to her pharmacy. They have not yet filled the rx and are asking the pt to bring a new rx for morphine 30 mg ( 2 tablets) BID instead. Vikki Ports, RN for Dr. Darnelle Catalan, aware and asked this RN to have patient visit her today after completing tx in infusion room for new rx. This RN informed patient and she and her husband voiced understanding and agree with this plan.

## 2013-05-22 NOTE — Patient Instructions (Addendum)
INR below goal today likely from just restarting coumadin after procedure Take Coumadin 7.5mg  today and tomorrow.  Then continue 5mg  daily except 7.5mg  on M, W, F.  Recheck INR on 06/12/13: lab at 8:15am,MD visit at 8:45am, treatment at 9:45am and we will see you in infusion.

## 2013-05-22 NOTE — Progress Notes (Signed)
PT seen during infusion today.  INR=1.5 INR likely remains low due to just restarting this week after procedure (and diet) Pt states her diet has included "lots of greens" with no other changes to report.  I told her greens are fine as long as she is consistent with intake and that we may need to increase her dose if she remains below goal.  She needs to let us know if there are any drastic changes.  Plan: Coumadin 7.5mg  today and tomorrow. Then continue 5mg  daily except 7.5mg  on M, W, F.  Recheck INR on 06/12/13: lab at 8:15am, MD visit at 8:45am, treatment at 9:45am.

## 2013-06-11 ENCOUNTER — Other Ambulatory Visit: Payer: Self-pay | Admitting: Physician Assistant

## 2013-06-11 DIAGNOSIS — C50919 Malignant neoplasm of unspecified site of unspecified female breast: Secondary | ICD-10-CM

## 2013-06-12 ENCOUNTER — Ambulatory Visit (HOSPITAL_BASED_OUTPATIENT_CLINIC_OR_DEPARTMENT_OTHER): Payer: Medicare HMO

## 2013-06-12 ENCOUNTER — Encounter: Payer: Self-pay | Admitting: Physician Assistant

## 2013-06-12 ENCOUNTER — Other Ambulatory Visit (HOSPITAL_BASED_OUTPATIENT_CLINIC_OR_DEPARTMENT_OTHER): Payer: Medicare HMO | Admitting: Lab

## 2013-06-12 ENCOUNTER — Ambulatory Visit (HOSPITAL_BASED_OUTPATIENT_CLINIC_OR_DEPARTMENT_OTHER): Payer: Medicare HMO | Admitting: Physician Assistant

## 2013-06-12 ENCOUNTER — Telehealth: Payer: Self-pay | Admitting: *Deleted

## 2013-06-12 ENCOUNTER — Ambulatory Visit (HOSPITAL_BASED_OUTPATIENT_CLINIC_OR_DEPARTMENT_OTHER): Payer: Medicare HMO | Admitting: Pharmacist

## 2013-06-12 ENCOUNTER — Encounter: Payer: Self-pay | Admitting: Oncology

## 2013-06-12 VITALS — BP 189/97 | HR 73 | Temp 98.4°F | Resp 18 | Ht 63.0 in | Wt 281.6 lb

## 2013-06-12 DIAGNOSIS — G620 Drug-induced polyneuropathy: Secondary | ICD-10-CM

## 2013-06-12 DIAGNOSIS — M542 Cervicalgia: Secondary | ICD-10-CM | POA: Insufficient documentation

## 2013-06-12 DIAGNOSIS — M25511 Pain in right shoulder: Secondary | ICD-10-CM | POA: Insufficient documentation

## 2013-06-12 DIAGNOSIS — I82409 Acute embolism and thrombosis of unspecified deep veins of unspecified lower extremity: Secondary | ICD-10-CM

## 2013-06-12 DIAGNOSIS — Z7901 Long term (current) use of anticoagulants: Secondary | ICD-10-CM

## 2013-06-12 DIAGNOSIS — C50919 Malignant neoplasm of unspecified site of unspecified female breast: Secondary | ICD-10-CM

## 2013-06-12 DIAGNOSIS — C787 Secondary malignant neoplasm of liver and intrahepatic bile duct: Secondary | ICD-10-CM

## 2013-06-12 DIAGNOSIS — C7951 Secondary malignant neoplasm of bone: Secondary | ICD-10-CM

## 2013-06-12 DIAGNOSIS — I871 Compression of vein: Secondary | ICD-10-CM

## 2013-06-12 DIAGNOSIS — F419 Anxiety disorder, unspecified: Secondary | ICD-10-CM

## 2013-06-12 DIAGNOSIS — M25519 Pain in unspecified shoulder: Secondary | ICD-10-CM

## 2013-06-12 DIAGNOSIS — C50912 Malignant neoplasm of unspecified site of left female breast: Secondary | ICD-10-CM

## 2013-06-12 DIAGNOSIS — Z5112 Encounter for antineoplastic immunotherapy: Secondary | ICD-10-CM

## 2013-06-12 LAB — CBC WITH DIFFERENTIAL/PLATELET
Basophils Absolute: 0 10*3/uL (ref 0.0–0.1)
Eosinophils Absolute: 0.3 10*3/uL (ref 0.0–0.5)
HCT: 36.3 % (ref 34.8–46.6)
HGB: 12.7 g/dL (ref 11.6–15.9)
LYMPH%: 42.6 % (ref 14.0–49.7)
MCHC: 35 g/dL (ref 31.5–36.0)
MONO#: 0.6 10*3/uL (ref 0.1–0.9)
NEUT#: 4.1 10*3/uL (ref 1.5–6.5)
NEUT%: 47.1 % (ref 38.4–76.8)
Platelets: 244 10*3/uL (ref 145–400)
WBC: 8.7 10*3/uL (ref 3.9–10.3)

## 2013-06-12 LAB — PROTIME-INR

## 2013-06-12 LAB — COMPREHENSIVE METABOLIC PANEL (CC13)
ALT: 14 U/L (ref 0–55)
AST: 15 U/L (ref 5–34)
Calcium: 9.4 mg/dL (ref 8.4–10.4)
Chloride: 108 mEq/L (ref 98–109)
Creatinine: 0.8 mg/dL (ref 0.6–1.1)
Glucose: 94 mg/dl (ref 70–140)
Total Bilirubin: 0.31 mg/dL (ref 0.20–1.20)
Total Protein: 8.5 g/dL — ABNORMAL HIGH (ref 6.4–8.3)

## 2013-06-12 LAB — POCT INR: INR: 1.6

## 2013-06-12 MED ORDER — LORAZEPAM 2 MG/ML IJ SOLN
1.0000 mg | Freq: Once | INTRAMUSCULAR | Status: AC | PRN
  Administered 2013-06-12: 1 mg via INTRAVENOUS

## 2013-06-12 MED ORDER — HEPARIN SOD (PORK) LOCK FLUSH 100 UNIT/ML IV SOLN
500.0000 [IU] | Freq: Once | INTRAVENOUS | Status: AC | PRN
  Administered 2013-06-12: 500 [IU]
  Filled 2013-06-12: qty 5

## 2013-06-12 MED ORDER — DIPHENHYDRAMINE HCL 25 MG PO CAPS
ORAL_CAPSULE | ORAL | Status: AC
  Filled 2013-06-12: qty 2

## 2013-06-12 MED ORDER — SODIUM CHLORIDE 0.9 % IJ SOLN
10.0000 mL | INTRAMUSCULAR | Status: DC | PRN
  Administered 2013-06-12: 10 mL
  Filled 2013-06-12: qty 10

## 2013-06-12 MED ORDER — ACETAMINOPHEN 325 MG PO TABS
ORAL_TABLET | ORAL | Status: AC
  Filled 2013-06-12: qty 2

## 2013-06-12 MED ORDER — SODIUM CHLORIDE 0.9 % IV SOLN
Freq: Once | INTRAVENOUS | Status: AC
  Administered 2013-06-12: 10:00:00 via INTRAVENOUS

## 2013-06-12 MED ORDER — TRASTUZUMAB CHEMO INJECTION 440 MG
6.0000 mg/kg | Freq: Once | INTRAVENOUS | Status: AC
  Administered 2013-06-12: 756 mg via INTRAVENOUS
  Filled 2013-06-12: qty 36

## 2013-06-12 MED ORDER — LORAZEPAM 2 MG/ML IJ SOLN
INTRAMUSCULAR | Status: AC
  Filled 2013-06-12: qty 1

## 2013-06-12 MED ORDER — ACETAMINOPHEN 325 MG PO TABS
650.0000 mg | ORAL_TABLET | Freq: Once | ORAL | Status: AC
  Administered 2013-06-12: 650 mg via ORAL

## 2013-06-12 MED ORDER — DIPHENHYDRAMINE HCL 25 MG PO CAPS
50.0000 mg | ORAL_CAPSULE | Freq: Once | ORAL | Status: AC
  Administered 2013-06-12: 50 mg via ORAL

## 2013-06-12 NOTE — Telephone Encounter (Signed)
appts made and printed. Pt is aware that tx will be added. i emailed MW to add the tx. Pt is aware that cs will call her w/appts for her MRI's and PET...td

## 2013-06-12 NOTE — Telephone Encounter (Signed)
Per staff message and POF I have scheduled appts.  JMW  

## 2013-06-12 NOTE — Progress Notes (Signed)
INR continues to be below goal.  This is probably due to the increased amount of greens Ms Pellegrino has been eating.  She states that her neighbor has a garden that he grows just for her.  She will continue to eat an abundance of greens through December.  Will need to continue to increase coumadin dose to get INR at goal.  Will probably then need to decrease coumadin dose after garden of greens are gone.  Increase coumadin to 5mg  M/F and 7.5mg  other days.  Will check PT/INR at next Herceptin infusion on 07/03/13.

## 2013-06-12 NOTE — Patient Instructions (Signed)
[facility] Discharge Instructions for Patients Receiving Chemotherapy  Today you received the following chemotherapy agents Herceptin  To help prevent nausea and vomiting after your treatment, we encourage you to take your nausea medication     If you develop nausea and vomiting that is not controlled by your nausea medication, call the clinic.   BELOW ARE SYMPTOMS THAT SHOULD BE REPORTED IMMEDIATELY:  *FEVER GREATER THAN 100.5 F  *CHILLS WITH OR WITHOUT FEVER  NAUSEA AND VOMITING THAT IS NOT CONTROLLED WITH YOUR NAUSEA MEDICATION  *UNUSUAL SHORTNESS OF BREATH  *UNUSUAL BRUISING OR BLEEDING  TENDERNESS IN MOUTH AND THROAT WITH OR WITHOUT PRESENCE OF ULCERS  *URINARY PROBLEMS  *BOWEL PROBLEMS  UNUSUAL RASH Items with * indicate a potential emergency and should be followed up as soon as possible.  Feel free to call the clinic you have any questions or concerns. The clinic phone number is [phone].    

## 2013-06-12 NOTE — Progress Notes (Signed)
[facility]  [phone] [phone]  OFFICE PROGRESS NOTE  ID: [name]   DOB: 1951/04/01  MR#: [id number]  ZHY#:[id number]  PCP: Philemon Kingdom, MD GYN:  SU:  OTHER MD: Elmon Kirschner, Antony Blackbird   HISTORY OF PRESENT ILLNESS:   From Dr. Renelda Loma note 06/04/2003: "This woman has been in good health all of her life.  She noted a swelling and discomfort in her right breast in June of this year.  She was seen in the Emergency Room in [place] and was treated for mastitis.  She was treated for a number of months with mastitis and the swelling did not get better. She was given hydrocodone and Cipro.  Finally, the swelling did get better and ultimately the nipple became retracted and she noticed some dimpling in her skin. She had a mammogram in July of this year in [place] with subsequent mammogram on May 27, 2003, by Dr. Yolanda Bonine.  Mammogram done on September 30 showed marked increased density in the left breast.  Biopsy was performed the same day.  It was noted at the 12 o'clock position, deep in the breast was a focal hypoechoic mass, at least 3.5 cm in diameter.  Biopsy did in fact show invasive in situ mammary carcinoma. This was felt to be both at least intermediate, high grade.  No definite lymphovascular invasion was identified. ER and PR, Her2 testing is pending.  [name] continues to have pain in her breast.  She continues to take hydrocodone a number of times a day.  She has been seen by Dr. Earlene Plater, who felt that neoadjuvant chemotherapy would be required.    Initial staging studies showed evidence of liver and lung mets.   Patient also has evidence of bone lesions. Patient started neoadjuvant chemotherapy, Taxotere/Carbo/Herceptin in October 2004.   Patient had a CT scan in December 2004 which demonstrated extensive clot in the SVC innominate vein, bilateral jugular vein and  She was started on anticoagulation therapy. She  received a total of 6 cycles of Taxotere/Carbo/Herceptin, completed in April 2005."  Her subsequent history is as detailed below.  INTERVAL HISTORY: [name] returns today accompanied by her husband, [name], for follow up of her metastatic breast cancer.  She continues to receive trastuzumab every 3 weeks and is due for her next dose today. She's tolerating the trastuzumab well, and a recent echocardiogram in August showed a well preserved ejection fraction. She is also followed by the Coumadin clinic every 3 weeks for lifelong anticoagulant showed in the setting of previous SVC.    Interval history is notable for [name] having been hospitalized in late July with fever and a positive blood culture. She was treated aggressively with antibiotics. Her PICC line was removed, and she later had a new port placed in the right upper chest wall in September. She's had no additional fevers or chills. She has no additional signs of infection and had no complications with port placement.  [name]'s biggest complaint today is continued pain in the right shoulder. This is not a new problem, but she does feel that it has worsened since her appointment here in July. It seems to occur more frequently, and the quality of the pain itself is also worsened. The range of motion in the upper tremor these decreased secondary to the pain, and this does limit some of her day-to-day activities.  The pain worsens with even minimal activity involving the right arm/shoulder. She takes her pain medication appropriately with some relief.  She's had no swelling in the right arm itself.  She denies any known injuries to the right shoulder or arm.  [name] tells me she is been having headaches, but with further questioning these actually seem to be occurring more so in the back of the neck. There is no associated dizziness or change in vision. She denies any radiating pain from the neck.   REVIEW OF SYSTEMS: [name] denies any recent  fevers or chills, but does have some hot flashes and night sweats.   She still has some anxiety and mood swings. She finds the alprazolam helpful with this.  She's eating and drinking well with no nausea and no recent change in bowel habits. She has had no signs of abnormal  bruising or bleeding. She has no increased cough,  shortness of breath, orthopnea, chest pain, or palpitations . No peripheral swelling. She still has chronic peripheral neuropathy which is stable.   A detailed review of systems is otherwise stable and noncontributory.   PAST MEDICAL HISTORY: Past Medical History  Diagnosis Date  . Breast cancer     mets to liver and lung  . Hypertension   . SVC syndrome   . History of chemotherapy Feb. 2006    taxotere/herceptin/carboplatin  . Radiation 07/31/2006    left upper chest  . Radiation 06/17/2006-06/27/2006    6480 cGy bilat. chest wall  . Neuropathy   . Thrombosis   . Breast cancer metastasized to multiple sites 02/26/2013    PAST SURGICAL HISTORY: Past Surgical History  Procedure Laterality Date  . Tubal ligation  1986  . Cholecystectomy  1989  . Mastectomy Bilateral   . Ankle surgery    . Peripherally inserted central catheter insertion    . Back surgery      FAMILY HISTORY Family History  Problem Relation Age of Onset  . Heart failure Father   . Cancer Father     Prostate cancer  . Heart failure Brother   . Cancer Brother     Prostate cancer  . Diabetes Maternal Aunt    She had three brothers, one died of gunshot wound, one of complications of diabetes mellitus and one of myocardial infarction.  She has no sisters.  Mother died of complications of brain metastasis in 98.  Father has had a myocardial infarction in 1999.  No history of breast or ovarian cancer in the family.     GYNECOLOGIC HISTORY:   Menarche at age 49.  Gravida 3, para 3.  First live birth at age 12.  No history of breast feeding. No history of hormonal replacement therapy.   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 Her husband used to work as a Psychologist, educational, but is now retired She has three children, [name] who lives in [place] and works as a Copy, [name] who lives in [place] and works as a Naval architect, and [place] who lives in [place] and also works as a Copy. The patient has 12 grandchildren and 3 great-grandchildren. She attends a [employer].  ADVANCED DIRECTIVES:  Not in place  HEALTH MAINTENANCE: (Updated 06/12/2013) History  Substance Use Topics  . Smoking status: Never Smoker   . Smokeless tobacco: Never Used  . Alcohol Use: Yes     Comment: occasional     Colonoscopy: Never  PAP:  1987  Bone density:  Never  Lipid panel:  Not on file  Allergies  Allergen Reactions  . Adhesive [Tape] Other (See Comments)    Tears skin   . Penicillins Hives    Current Outpatient Prescriptions  Medication Sig Dispense Refill  . acetaminophen (TYLENOL) 500 MG tablet Take 500 mg by mouth every 6 (six) hours as needed for pain or fever.      Marland Kitchen albuterol (PROVENTIL HFA;VENTOLIN HFA) 108 (90 BASE) MCG/ACT inhaler Inhale 2 puffs into the lungs every 6 (six) hours as needed for wheezing.      Marland Kitchen ALPRAZolam (XANAX) 1 MG tablet Take 1 mg by mouth 3 (three) times daily as needed for anxiety.      Marland Kitchen amLODipine (NORVASC) 10 MG tablet Take 10 mg by mouth every morning.      . diclofenac sodium (VOLTAREN) 1 % GEL Apply 2 g topically daily as needed (for pain). Apply to knees and shoulders      . furosemide (LASIX) 80 MG tablet Take 80 mg by mouth.      . gabapentin (NEURONTIN) 300 MG capsule Take 300 mg by mouth 3 (three) times daily.      Marland Kitchen losartan (COZAAR) 100 MG tablet Take 100 mg by mouth every morning.      . metoprolol succinate (TOPROL-XL) 25 MG 24 hr tablet Take 25 mg by mouth daily.      Marland Kitchen morphine (MS CONTIN) 30 MG 12 hr tablet Take 2 tablets (60 mg total) by mouth 2  (two) times daily.  180 tablet  0  . morphine (MSIR) 15 MG tablet Take 1 tablet (15 mg total) by mouth every 4 (four) hours as needed for pain.  30 tablet  0  . potassium chloride (MICRO-K) 10 MEQ CR capsule Take 10 mEq by mouth 2 (two) times daily.       . prochlorperazine (COMPAZINE) 10 MG tablet Take 10 mg by mouth every 6 (six) hours as needed (for nausea).      Marland Kitchen spironolactone (ALDACTONE) 25 MG tablet Take 12.5 mg by mouth every morning.      . warfarin (COUMADIN) 2.5 MG tablet Take 2.5 mg by mouth daily. Take along with a 5mg  to make a 7.5 mg on Tuesday, Thursday, Saturday and Sunday.      . warfarin (COUMADIN) 5 MG tablet Take 5 mg by mouth as directed. Take 5mg  on Monday, Wednesday and Friday. Take along with the 2.5 for 7.5mg  on Tuesday, Thursday, Saturday and Sunday.      . zolpidem (AMBIEN) 10 MG tablet Take 10 mg by mouth at bedtime as needed for sleep.       No current facility-administered medications for this visit.   Facility-Administered Medications Ordered in Other Visits  Medication Dose Route Frequency Provider Last Rate Last Dose  . sodium chloride 0.9 % injection 10 mL  10 mL Intracatheter PRN Catalina Gravel, PA-C   10 mL at 06/12/13 1108    OBJECTIVE:  Middle aged [place] American female in no acute distress. Filed Vitals:   06/12/13 0849  BP: 189/97  Pulse: 73  Temp: 98.4 F (36.9 C)  Resp: 18     Body mass index is 49.9 kg/(m^2).    ECOG FS: 1 Filed Weights   06/12/13 0849  Weight: 281 lb 9.6 oz (127.733 kg)   Filed Weights   06/12/13 0849  Weight: 281 lb 9.6 oz (127.733 kg)   Physical Exam: HEENT:  Sclerae anicteric.  Oropharynx clear. No evidence of ulceration. As Nodes: No cervical or supraclavicular lymphadenopathy palpated today.  Breast Exam: Patient is status post bilateral mastectomies. She is somewhat tender to touch on exam. There no suspicious nodularities or skin changes. No evidence of local recurrence. No axillary adenopathy on the right or the  left. Port is intact in the right upper chest wall with no erythema, edema, or evidence of infection Lungs:  Clear to auscultation bilaterally. No rhonchi or wheezes.  Heart:  Regular rate and rhythm. No murmur appreciated  Abdomen:  Soft, obese, nontender.  Positive bowel sounds.  Musculoskeletal:  No focal spinal tenderness to palpation, including the cervical spine. There is tenderness to palpation of the right shoulder and scapular region, and range of motion is somewhat limited secondary to discomfort. Extremities: No peripheral edema  Neuro:  Nonfocal, well oriented, appropriate affect    LAB RESULTS: Lab Results  Component Value Date   WBC 8.7 06/12/2013   NEUTROABS 4.1 06/12/2013   HGB 12.7 06/12/2013   HCT 36.3 06/12/2013   MCV 78.7* 06/12/2013   PLT 244 06/12/2013      Chemistry      Component Value Date/Time   NA 141 06/12/2013 0830   NA 138 03/21/2013 0658   K 4.0 06/12/2013 0830   K 3.6 03/21/2013 0658   CL 103 03/21/2013 0658   CL 104 01/30/2013 0850   CO2 25 06/12/2013 0830   CO2 27 03/21/2013 0658   BUN 11.4 06/12/2013 0830   BUN 11 03/21/2013 0658   CREATININE 0.8 06/12/2013 0830   CREATININE 0.82 03/21/2013 0658      Component Value Date/Time   CALCIUM 9.4 06/12/2013 0830   CALCIUM 9.5 03/21/2013 0658   ALKPHOS 94 06/12/2013 0830   ALKPHOS 87 04/11/2012 0837   AST 15 06/12/2013 0830   AST 19 04/11/2012 0837   ALT 14 06/12/2013 0830   ALT 14 04/11/2012 0837   BILITOT 0.31 06/12/2013 0830   BILITOT 0.8 04/11/2012 0837      STUDIES:  Most recent echocardiogram on 04/17/2013 showed a well preserved ejection fraction of 60-65%.    ASSESSMENT: 62 y.o.  [place], [place], woman  (1)  with a history of inflammatory right breast cancer metastatic at presentation September 2004 with involvement of liver and bone, HER-2 positive, estrogen and progesterone receptor negative  (2) treated with carboplatin, docetaxel and Herceptin x6 completed April  2005  (3) trastuzumab continued indefinitely; has also received lapatinib and capecitabine for variable intervals in 2007-2008.  (4) status post bilateral mastectomies with bilateral axillary lymph node dissection 12/07/2004, showing  (a) on the right, a mypT1c ypN1 invasive ductal carcinoma, grade 3, estrogen and progesterone receptor negative, HER-2 positive, with an MIB-1 of 31%  (b) on the left, ypT2 ypN1 invasive ductal carcinoma, grade 2, estrogen and progesterone receptor negative, HER-2 positive, with an MIB-1 of 35%.  (5)  Status post radiation June through July of 2006, to the right chest wall, left chest wall, bilateral supraclavicular fossae, and bilateral axillary boosts; with additional radiation to the right and left chest walls and the central chest wall completed November of 2007  (6) status post ixempra x9 completed August of 2009.  (7) history of superior vena caval syndrome, on life long anticoagulation   (8)  History of chemotherapy-induced neuropathy. On Neurontin 600 mg p.o. T.i.d., MS Contin, and MSIR.  (9)  increased pain in right shoulder and neck, to be evaluated by MRI   PLAN: With regards to Auria's treatment, she is tolerating trastuzumab well and will receive her next q. three-week dose  today as scheduled. We are making no change is currently to her treatment regimen, and I will schedule her out every 3 weeks for the next 3 months. She'll continue to be followed by our Coumadin Clinic at each visit. Her next echocardiogram is due in November.  She has not agreed to treatment with zoledronic acid.  Callahan's neck and shoulder pain does seem to have worsened since her appointment here in July. At that time, she declined any imaging studies to evaluate this pain, but upon evaluation today she agrees to undergo an MRI of the cervical spine as well as an MRI of the right shoulder with brachial plexus.  Upon review with Dr. Darnelle Catalan, we will also plan on restaging  with a PET scan prior to her visit with him in January.   This was reviewed in detail with the patient today. [place] and [place] both voice understanding and agreement with our plan today. She knows as always to call with any changes or problems.     Dimitri Dsouza, PA-C 06/12/2013  12:25 PM

## 2013-06-14 ENCOUNTER — Other Ambulatory Visit: Payer: Self-pay | Admitting: Oncology

## 2013-06-14 DIAGNOSIS — C50919 Malignant neoplasm of unspecified site of unspecified female breast: Secondary | ICD-10-CM

## 2013-06-15 ENCOUNTER — Other Ambulatory Visit: Payer: Self-pay | Admitting: Oncology

## 2013-06-15 DIAGNOSIS — C50919 Malignant neoplasm of unspecified site of unspecified female breast: Secondary | ICD-10-CM

## 2013-06-24 ENCOUNTER — Ambulatory Visit (HOSPITAL_COMMUNITY): Payer: Medicare HMO

## 2013-06-24 ENCOUNTER — Ambulatory Visit (HOSPITAL_COMMUNITY): Admission: RE | Admit: 2013-06-24 | Payer: Medicare HMO | Source: Ambulatory Visit

## 2013-06-29 ENCOUNTER — Other Ambulatory Visit: Payer: Self-pay | Admitting: Oncology

## 2013-06-30 ENCOUNTER — Other Ambulatory Visit: Payer: Self-pay | Admitting: *Deleted

## 2013-06-30 MED ORDER — ALPRAZOLAM 1 MG PO TABS: 1.0000 mg | ORAL_TABLET | Freq: Three times a day (TID) | ORAL | Status: DC | PRN

## 2013-07-01 ENCOUNTER — Ambulatory Visit (HOSPITAL_BASED_OUTPATIENT_CLINIC_OR_DEPARTMENT_OTHER)
Admission: RE | Admit: 2013-07-01 | Discharge: 2013-07-01 | Disposition: A | Discharge status: Home / Self Care | Payer: Medicare HMO | Source: Ambulatory Visit | Attending: Internal Medicine | Admitting: Internal Medicine

## 2013-07-01 ENCOUNTER — Ambulatory Visit (HOSPITAL_COMMUNITY)
Admission: RE | Admit: 2013-07-01 | Discharge: 2013-07-01 | Disposition: A | Discharge status: Home / Self Care | Payer: Medicare HMO | Source: Ambulatory Visit | Attending: Internal Medicine | Admitting: Internal Medicine

## 2013-07-01 VITALS — BP 150/95 | HR 67 | Wt 286.5 lb

## 2013-07-01 DIAGNOSIS — C50919 Malignant neoplasm of unspecified site of unspecified female breast: Secondary | ICD-10-CM | POA: Insufficient documentation

## 2013-07-01 DIAGNOSIS — I10 Essential (primary) hypertension: Secondary | ICD-10-CM | POA: Insufficient documentation

## 2013-07-01 DIAGNOSIS — I509 Heart failure, unspecified: Secondary | ICD-10-CM | POA: Insufficient documentation

## 2013-07-01 DIAGNOSIS — I519 Heart disease, unspecified: Secondary | ICD-10-CM

## 2013-07-01 DIAGNOSIS — C50912 Malignant neoplasm of unspecified site of left female breast: Secondary | ICD-10-CM

## 2013-07-01 DIAGNOSIS — I059 Rheumatic mitral valve disease, unspecified: Secondary | ICD-10-CM | POA: Insufficient documentation

## 2013-07-01 DIAGNOSIS — C800 Disseminated malignant neoplasm, unspecified: Secondary | ICD-10-CM

## 2013-07-01 NOTE — Progress Notes (Signed)
HPI:  [name] is a 62 year old with a history of metastatic HER-2 positive breast carcinoma originally diagnosed September 2004. Started in R breast. Underwent neo-adjuvant chemo. During this time developed L breast CA. Underwent bilateral mastectomy. Lymph nodes +. 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   She received a total of 6 cycles of Taxotere/Carbo/Herceptin, completed in April 2005, after which she began single agent Herceptin, given every 3 weeks for 9 years. Plan to continue on Herceptin indefinitely.   04/23/12 ECHO EF 60-65% Lateral s' 8.9 cm/s  07/30/12 ECHO EF 60-65% Lateral s' 8.3 cm/s  09/11/12 ECHO EF 60-65% Lateral s' 10.3 cm/s  12/22/12 ECHO 55-60% Lateral S' 9.8 RV mildly dilated  07/01/13 ECHO 55-60%, lateral s' 9.79, mild RV dilation, grade II DD  Follow up: Since last visit was admitted to the hospital for gram negative bacteremia secondary to catheter related blood stream infection. Had new power port replaced on the R. Feeling better now. Yesterday went to PCP and BP elevated 170-180/100s increased Toprol to 50 mg daily. +chest spasms for years. + tightness in chest that comes and goes. Chronic bilateral edema.   ROS: All systems negative except as listed in HPI, PMH and Problem List.  Past Medical History  Diagnosis Date  . Breast cancer     mets to liver and lung  . Hypertension   . SVC syndrome   . History of chemotherapy Feb. 2006    taxotere/herceptin/carboplatin  . Radiation 07/31/2006    left upper chest  . Radiation 06/17/2006-06/27/2006    6480 cGy bilat. chest wall  . Neuropathy   . Thrombosis   . Breast cancer metastasized to multiple sites 02/26/2013    Current Outpatient Prescriptions  Medication Sig Dispense Refill  . acetaminophen (TYLENOL) 500 MG tablet Take 500 mg by mouth every 6 (six) hours as needed for pain or fever.      Marland Kitchen albuterol (PROVENTIL HFA;VENTOLIN HFA) 108 (90 BASE) MCG/ACT  inhaler Inhale 2 puffs into the lungs every 6 (six) hours as needed for wheezing.      Marland Kitchen ALPRAZolam (XANAX) 1 MG tablet Take 1 tablet (1 mg total) by mouth 3 (three) times daily as needed for anxiety.  30 tablet  1  . amLODipine (NORVASC) 10 MG tablet Take 10 mg by mouth every morning.      . diclofenac sodium (VOLTAREN) 1 % GEL Apply 2 g topically daily as needed (for pain). Apply to knees and shoulders      . furosemide (LASIX) 80 MG tablet Take 80 mg by mouth.      . gabapentin (NEURONTIN) 300 MG capsule TAKE 2 CAPSULES BY MOUTH THREE TIMES DAILY  540 capsule  0  . losartan (COZAAR) 100 MG tablet Take 100 mg by mouth every morning.      . metoprolol succinate (TOPROL-XL) 50 MG 24 hr tablet Take 50 mg by mouth daily. Take with or immediately following a meal.      . morphine (MS CONTIN) 30 MG 12 hr tablet Take 2 tablets (60 mg total) by mouth 2 (two) times daily.  180 tablet  0  . morphine (MSIR) 15 MG tablet Take 1 tablet (15 mg total) by mouth every 4 (four) hours as needed for pain.  30 tablet  0  . potassium chloride (MICRO-K) 10 MEQ CR capsule Take 10 mEq by mouth 2 (two) times daily.       . prochlorperazine (COMPAZINE) 10 MG tablet Take  10 mg by mouth every 6 (six) hours as needed (for nausea).      Marland Kitchen spironolactone (ALDACTONE) 25 MG tablet Take 12.5 mg by mouth every morning.      . warfarin (COUMADIN) 2.5 MG tablet Take 2.5 mg by mouth daily. Take along with a 5mg  to make a 7.5 mg on Tuesday, Thursday, Saturday and Sunday.      . warfarin (COUMADIN) 5 MG tablet Take 5 mg by mouth as directed. Take 5mg  on Monday, Wednesday and Friday. Take along with the 2.5 for 7.5mg  on Tuesday, Thursday, Saturday and Sunday.      . zolpidem (AMBIEN) 10 MG tablet Take 10 mg by mouth at bedtime as needed for sleep.       No current facility-administered medications for this encounter.    Filed Vitals:   07/01/13 1014  BP: 150/95  Pulse: 67  Weight: 286 lb 8 oz (129.956 kg)  SpO2: 92%    PHYSICAL  EXAM: General: Well appearing. No respiratory difficulty Husband present  HEENT: normal  Neck: supple. no JVD difficult to assess d/t body habitus but does not appear elevated; Carotids 2+ bilat; no bruits. No lymphadenopathy or thryomegaly appreciated.  Cor: PMI nondisplaced. Regular rate & rhythm. No rubs, gallops or murmurs. S/P bilateral mastectomies  Lungs: clear  Abdomen: obese. soft, nontender, nondistended. Good bowel sounds.  Extremities: no cyanosis, clubbing, rash, 1+ edema  Neuro: alert & oriented x 3, cranial nerves grossly intact. moves all 4 extremities w/o difficulty. Affect pleasant.  ASSESSMENT & PLAN:  1) L Breast Cancer s/p bilateral mastectomies:  ECHO reviewed today by Dr. Shirlee Latch. EF and lateral s' stable. She does have grade II DD. Volume status stable. Will follow up in 3 months with ECHO. 2) Suspected OSA: Patient has thick neck, mild RV dilation, snores at night.  Suggested sleep study but patient wants to hold off for now.  3) HTN: BP elevated still today.  Just increased Toprol XL.  If high at next appointment, can increase spironolactone.  [name] B NP-C 1:02 PM  Patient seen with NP, agree with above note.  I reviewed echo.  EF and lateral s' are stable. Continue Herceptin.   I think she eventually needs sleep study.   [name] 07/01/2013

## 2013-07-01 NOTE — Progress Notes (Signed)
  Echocardiogram 2D Echocardiogram has been performed.  [name] [name] 07/01/2013, 10:01 AM

## 2013-07-01 NOTE — Patient Instructions (Signed)
Follow up 3 months with ECHO   

## 2013-07-02 ENCOUNTER — Other Ambulatory Visit: Payer: Self-pay

## 2013-07-02 ENCOUNTER — Telehealth (HOSPITAL_COMMUNITY): Payer: Self-pay | Admitting: *Deleted

## 2013-07-02 NOTE — Telephone Encounter (Signed)
Pt's ov note and echo from 11/5 faxed to pcp Dr Philemon Kingdom at [phone] per patient request

## 2013-07-03 ENCOUNTER — Ambulatory Visit (HOSPITAL_BASED_OUTPATIENT_CLINIC_OR_DEPARTMENT_OTHER): Payer: Medicare HMO | Admitting: Pharmacist

## 2013-07-03 ENCOUNTER — Other Ambulatory Visit: Payer: Self-pay | Admitting: *Deleted

## 2013-07-03 ENCOUNTER — Ambulatory Visit (HOSPITAL_BASED_OUTPATIENT_CLINIC_OR_DEPARTMENT_OTHER): Payer: Medicare HMO

## 2013-07-03 ENCOUNTER — Other Ambulatory Visit (HOSPITAL_BASED_OUTPATIENT_CLINIC_OR_DEPARTMENT_OTHER): Payer: Medicare HMO | Admitting: Lab

## 2013-07-03 ENCOUNTER — Ambulatory Visit: Payer: Medicare HMO

## 2013-07-03 DIAGNOSIS — C50919 Malignant neoplasm of unspecified site of unspecified female breast: Secondary | ICD-10-CM

## 2013-07-03 DIAGNOSIS — C50912 Malignant neoplasm of unspecified site of left female breast: Secondary | ICD-10-CM

## 2013-07-03 DIAGNOSIS — Z7901 Long term (current) use of anticoagulants: Secondary | ICD-10-CM

## 2013-07-03 DIAGNOSIS — Z5112 Encounter for antineoplastic immunotherapy: Secondary | ICD-10-CM

## 2013-07-03 DIAGNOSIS — I871 Compression of vein: Secondary | ICD-10-CM

## 2013-07-03 LAB — CBC WITH DIFFERENTIAL/PLATELET
BASO%: 0.2 % (ref 0.0–2.0)
Eosinophils Absolute: 0.3 10*3/uL (ref 0.0–0.5)
LYMPH%: 42.6 % (ref 14.0–49.7)
MCHC: 34.5 g/dL (ref 31.5–36.0)
MONO#: 0.6 10*3/uL (ref 0.1–0.9)
NEUT#: 4.1 10*3/uL (ref 1.5–6.5)
NEUT%: 47.1 % (ref 38.4–76.8)
Platelets: 237 10*3/uL (ref 145–400)
RBC: 4.41 10*6/uL (ref 3.70–5.45)
RDW: 14.7 % — ABNORMAL HIGH (ref 11.2–14.5)
WBC: 8.6 10*3/uL (ref 3.9–10.3)
lymph#: 3.7 10*3/uL — ABNORMAL HIGH (ref 0.9–3.3)
nRBC: 0 % (ref 0–0)

## 2013-07-03 LAB — COMPREHENSIVE METABOLIC PANEL (CC13)
AST: 21 U/L (ref 5–34)
Alkaline Phosphatase: 103 U/L (ref 40–150)
Anion Gap: 11 mEq/L (ref 3–11)
BUN: 14.8 mg/dL (ref 7.0–26.0)
CO2: 24 mEq/L (ref 22–29)
Creatinine: 0.8 mg/dL (ref 0.6–1.1)
Glucose: 98 mg/dl (ref 70–140)
Sodium: 142 mEq/L (ref 136–145)
Total Bilirubin: 0.37 mg/dL (ref 0.20–1.20)
Total Protein: 8.2 g/dL (ref 6.4–8.3)

## 2013-07-03 LAB — PROTIME-INR
INR: 1.3 — ABNORMAL LOW (ref 2.00–3.50)
Protime: 15.6 Seconds — ABNORMAL HIGH (ref 10.6–13.4)

## 2013-07-03 LAB — POCT INR: INR: 1.3

## 2013-07-03 MED ORDER — ALPRAZOLAM 1 MG PO TABS: 1.0000 mg | ORAL_TABLET | Freq: Three times a day (TID) | ORAL | Status: DC | PRN

## 2013-07-03 MED ORDER — ACETAMINOPHEN 325 MG PO TABS
ORAL_TABLET | ORAL | Status: AC
  Filled 2013-07-03: qty 2

## 2013-07-03 MED ORDER — HEPARIN SOD (PORK) LOCK FLUSH 100 UNIT/ML IV SOLN
500.0000 [IU] | Freq: Once | INTRAVENOUS | Status: AC | PRN
  Administered 2013-07-03: 500 [IU]
  Filled 2013-07-03: qty 5

## 2013-07-03 MED ORDER — DIPHENHYDRAMINE HCL 25 MG PO CAPS
50.0000 mg | ORAL_CAPSULE | Freq: Once | ORAL | Status: AC
  Administered 2013-07-03: 50 mg via ORAL

## 2013-07-03 MED ORDER — ACETAMINOPHEN 325 MG PO TABS
650.0000 mg | ORAL_TABLET | Freq: Once | ORAL | Status: AC
  Administered 2013-07-03: 650 mg via ORAL

## 2013-07-03 MED ORDER — TRASTUZUMAB CHEMO INJECTION 440 MG
6.0000 mg/kg | Freq: Once | INTRAVENOUS | Status: AC
  Administered 2013-07-03: 756 mg via INTRAVENOUS
  Filled 2013-07-03: qty 36

## 2013-07-03 MED ORDER — SODIUM CHLORIDE 0.9 % IJ SOLN
10.0000 mL | INTRAMUSCULAR | Status: DC | PRN
  Administered 2013-07-03: 10 mL
  Filled 2013-07-03: qty 10

## 2013-07-03 MED ORDER — MORPHINE SULFATE ER 30 MG PO TBCR: 60.0000 mg | EXTENDED_RELEASE_TABLET | Freq: Two times a day (BID) | ORAL | Status: DC

## 2013-07-03 MED ORDER — DIPHENHYDRAMINE HCL 25 MG PO CAPS
ORAL_CAPSULE | ORAL | Status: AC
  Filled 2013-07-03: qty 2

## 2013-07-03 MED ORDER — LORAZEPAM 2 MG/ML IJ SOLN
INTRAMUSCULAR | Status: AC
  Filled 2013-07-03: qty 1

## 2013-07-03 MED ORDER — SODIUM CHLORIDE 0.9 % IV SOLN
Freq: Once | INTRAVENOUS | Status: AC
  Administered 2013-07-03: 10:00:00 via INTRAVENOUS

## 2013-07-03 MED ORDER — MORPHINE SULFATE 15 MG PO TABS: 15.0000 mg | ORAL_TABLET | ORAL | Status: DC | PRN

## 2013-07-03 MED ORDER — LORAZEPAM 2 MG/ML IJ SOLN
1.0000 mg | Freq: Once | INTRAMUSCULAR | Status: AC | PRN
  Administered 2013-07-03: 1 mg via INTRAVENOUS

## 2013-07-03 NOTE — Progress Notes (Signed)
INR = 1.3      Goal 2-3 Patient states that she has been consistently been eating greens out of her garden 4 times per week.  She will continue this until after Thanksgiving.  This is likely the reason for her subtherapeutic INR. She has had no missed doses. Only medication is increased dose of metoprolol to 50 mg daily done earlier this week by PCP. No complications of anticoagulation noted. Next INR with infusion appointment on 07/24/13. Will increase Coumadin dose to 7.5 mg daily and see patient during infusion appointment on 07/24/13.  Cletis Athens, PharmD

## 2013-07-03 NOTE — Patient Instructions (Signed)

## 2013-07-07 ENCOUNTER — Other Ambulatory Visit: Payer: Self-pay | Admitting: *Deleted

## 2013-07-13 ENCOUNTER — Other Ambulatory Visit: Payer: Self-pay | Admitting: Oncology

## 2013-07-13 DIAGNOSIS — I82409 Acute embolism and thrombosis of unspecified deep veins of unspecified lower extremity: Secondary | ICD-10-CM

## 2013-07-17 ENCOUNTER — Other Ambulatory Visit: Payer: Self-pay | Admitting: *Deleted

## 2013-07-24 ENCOUNTER — Other Ambulatory Visit: Payer: Self-pay | Admitting: *Deleted

## 2013-07-24 ENCOUNTER — Ambulatory Visit (HOSPITAL_BASED_OUTPATIENT_CLINIC_OR_DEPARTMENT_OTHER): Payer: Medicare HMO | Admitting: Pharmacist

## 2013-07-24 ENCOUNTER — Other Ambulatory Visit (HOSPITAL_BASED_OUTPATIENT_CLINIC_OR_DEPARTMENT_OTHER): Payer: Medicare HMO | Admitting: Lab

## 2013-07-24 ENCOUNTER — Ambulatory Visit (HOSPITAL_BASED_OUTPATIENT_CLINIC_OR_DEPARTMENT_OTHER): Payer: Medicare HMO

## 2013-07-24 VITALS — BP 131/69 | HR 65 | Temp 97.9°F | Resp 18

## 2013-07-24 DIAGNOSIS — C50919 Malignant neoplasm of unspecified site of unspecified female breast: Secondary | ICD-10-CM

## 2013-07-24 DIAGNOSIS — Z5112 Encounter for antineoplastic immunotherapy: Secondary | ICD-10-CM

## 2013-07-24 DIAGNOSIS — C787 Secondary malignant neoplasm of liver and intrahepatic bile duct: Secondary | ICD-10-CM

## 2013-07-24 DIAGNOSIS — Z86718 Personal history of other venous thrombosis and embolism: Secondary | ICD-10-CM

## 2013-07-24 DIAGNOSIS — C50912 Malignant neoplasm of unspecified site of left female breast: Secondary | ICD-10-CM

## 2013-07-24 DIAGNOSIS — Z7901 Long term (current) use of anticoagulants: Secondary | ICD-10-CM

## 2013-07-24 LAB — COMPREHENSIVE METABOLIC PANEL (CC13)
ALT: 16 U/L (ref 0–55)
Albumin: 3.3 g/dL — ABNORMAL LOW (ref 3.5–5.0)
Alkaline Phosphatase: 91 U/L (ref 40–150)
Anion Gap: 9 mEq/L (ref 3–11)
CO2: 24 mEq/L (ref 22–29)
Creatinine: 0.9 mg/dL (ref 0.6–1.1)
Potassium: 4 mEq/L (ref 3.5–5.1)
Sodium: 141 mEq/L (ref 136–145)
Total Bilirubin: 0.34 mg/dL (ref 0.20–1.20)
Total Protein: 7.7 g/dL (ref 6.4–8.3)

## 2013-07-24 LAB — CBC WITH DIFFERENTIAL/PLATELET
BASO%: 0.2 % (ref 0.0–2.0)
Basophils Absolute: 0 10*3/uL (ref 0.0–0.1)
EOS%: 3 % (ref 0.0–7.0)
Eosinophils Absolute: 0.3 10*3/uL (ref 0.0–0.5)
HGB: 11.1 g/dL — ABNORMAL LOW (ref 11.6–15.9)
MCH: 27.7 pg (ref 25.1–34.0)
MCHC: 34.6 g/dL (ref 31.5–36.0)
MCV: 80 fL (ref 79.5–101.0)
MONO#: 0.7 10*3/uL (ref 0.1–0.9)
RBC: 4.01 10*6/uL (ref 3.70–5.45)
RDW: 15.1 % — ABNORMAL HIGH (ref 11.2–14.5)
WBC: 8.8 10*3/uL (ref 3.9–10.3)
lymph#: 3.4 10*3/uL — ABNORMAL HIGH (ref 0.9–3.3)

## 2013-07-24 LAB — PROTIME-INR: INR: 1.6 — ABNORMAL LOW (ref 2.00–3.50)

## 2013-07-24 MED ORDER — PROCHLORPERAZINE MALEATE 10 MG PO TABS: 10.0000 mg | ORAL_TABLET | Freq: Four times a day (QID) | ORAL | Status: DC | PRN

## 2013-07-24 MED ORDER — SODIUM CHLORIDE 0.9 % IJ SOLN
10.0000 mL | INTRAMUSCULAR | Status: DC | PRN
  Administered 2013-07-24: 10 mL
  Filled 2013-07-24: qty 10

## 2013-07-24 MED ORDER — ACETAMINOPHEN 325 MG PO TABS
ORAL_TABLET | ORAL | Status: AC
  Filled 2013-07-24: qty 2

## 2013-07-24 MED ORDER — DIPHENHYDRAMINE HCL 25 MG PO CAPS
50.0000 mg | ORAL_CAPSULE | Freq: Once | ORAL | Status: AC
  Administered 2013-07-24: 50 mg via ORAL

## 2013-07-24 MED ORDER — ACETAMINOPHEN 325 MG PO TABS
650.0000 mg | ORAL_TABLET | Freq: Once | ORAL | Status: AC
  Administered 2013-07-24: 650 mg via ORAL

## 2013-07-24 MED ORDER — LORAZEPAM 2 MG/ML IJ SOLN
1.0000 mg | Freq: Once | INTRAMUSCULAR | Status: AC | PRN
  Administered 2013-07-24: 1 mg via INTRAVENOUS

## 2013-07-24 MED ORDER — DIPHENHYDRAMINE HCL 25 MG PO CAPS
ORAL_CAPSULE | ORAL | Status: AC
  Filled 2013-07-24: qty 2

## 2013-07-24 MED ORDER — TRASTUZUMAB CHEMO INJECTION 440 MG
6.0000 mg/kg | Freq: Once | INTRAVENOUS | Status: AC
  Administered 2013-07-24: 756 mg via INTRAVENOUS
  Filled 2013-07-24: qty 36

## 2013-07-24 MED ORDER — HEPARIN SOD (PORK) LOCK FLUSH 100 UNIT/ML IV SOLN
500.0000 [IU] | Freq: Once | INTRAVENOUS | Status: AC | PRN
  Administered 2013-07-24: 500 [IU]
  Filled 2013-07-24: qty 5

## 2013-07-24 MED ORDER — LORAZEPAM 2 MG/ML IJ SOLN
INTRAMUSCULAR | Status: AC
  Filled 2013-07-24: qty 1

## 2013-07-24 MED ORDER — SODIUM CHLORIDE 0.9 % IV SOLN
Freq: Once | INTRAVENOUS | Status: AC
  Administered 2013-07-24: 10:00:00 via INTRAVENOUS

## 2013-07-24 NOTE — Progress Notes (Signed)
Patient's INR is still subtherapeutic today at 1.6 after increasing Coumadin dose to 7.5 mg every day.  Patient eats a lot of greens and wishes to continue doing this.  I informed her as long as she keeps her green intake consistent, we can increase her Coumadin dose to get her INR therapeutic.  I informed her that if she abruptly stopped eating greens or cut them out of her diet, her INR could increase drastically, putting her at risk for bleeding.  She voiced understanding of this.  Will increase her Coumadin dose further to 10 mg on Fridays and Tuesdays and 7.5 mg all other days.  Patient wishes to have appointment on same day as her PCP appointment next week so we will recheck her INR on 07/29/13; lab at 10:15 am and Coumadin clinic at 10:30 am.  Lillia Pauls, PharmD, BCPS Clinical Pharmacist Pager: [phone] 07/24/2013 9:29 AM

## 2013-07-24 NOTE — Patient Instructions (Signed)
Increase Coumadin dose to 10 mg on Tuesdays and Fridays. Take 7.5 mg of Coumadin on all other days. Recheck INR on 07/29/13; lab at 10:15 am and Coumadin clinic at 10:30 am.

## 2013-07-24 NOTE — Patient Instructions (Signed)
[facility] Discharge Instructions for Patients Receiving Chemotherapy  Today you received the following chemotherapy agents herceptin.  To help prevent nausea and vomiting after your treatment, we encourage you to take your nausea medication compazine.   If you develop nausea and vomiting that is not controlled by your nausea medication, call the clinic.   BELOW ARE SYMPTOMS THAT SHOULD BE REPORTED IMMEDIATELY:  *FEVER GREATER THAN 100.5 F  *CHILLS WITH OR WITHOUT FEVER  NAUSEA AND VOMITING THAT IS NOT CONTROLLED WITH YOUR NAUSEA MEDICATION  *UNUSUAL SHORTNESS OF BREATH  *UNUSUAL BRUISING OR BLEEDING  TENDERNESS IN MOUTH AND THROAT WITH OR WITHOUT PRESENCE OF ULCERS  *URINARY PROBLEMS  *BOWEL PROBLEMS  UNUSUAL RASH Items with * indicate a potential emergency and should be followed up as soon as possible.  Feel free to call the clinic you have any questions or concerns. The clinic phone number is [phone].    

## 2013-07-27 ENCOUNTER — Other Ambulatory Visit: Payer: Self-pay | Admitting: Physician Assistant

## 2013-07-27 DIAGNOSIS — D638 Anemia in other chronic diseases classified elsewhere: Secondary | ICD-10-CM

## 2013-07-29 ENCOUNTER — Other Ambulatory Visit (HOSPITAL_BASED_OUTPATIENT_CLINIC_OR_DEPARTMENT_OTHER): Payer: Medicare HMO | Admitting: Lab

## 2013-07-29 ENCOUNTER — Ambulatory Visit (HOSPITAL_BASED_OUTPATIENT_CLINIC_OR_DEPARTMENT_OTHER): Payer: Medicare HMO | Admitting: Pharmacist

## 2013-07-29 DIAGNOSIS — Z86718 Personal history of other venous thrombosis and embolism: Secondary | ICD-10-CM

## 2013-07-29 DIAGNOSIS — D638 Anemia in other chronic diseases classified elsewhere: Secondary | ICD-10-CM

## 2013-07-29 DIAGNOSIS — C50919 Malignant neoplasm of unspecified site of unspecified female breast: Secondary | ICD-10-CM

## 2013-07-29 DIAGNOSIS — I82409 Acute embolism and thrombosis of unspecified deep veins of unspecified lower extremity: Secondary | ICD-10-CM

## 2013-07-29 DIAGNOSIS — C787 Secondary malignant neoplasm of liver and intrahepatic bile duct: Secondary | ICD-10-CM

## 2013-07-29 DIAGNOSIS — Z7901 Long term (current) use of anticoagulants: Secondary | ICD-10-CM

## 2013-07-29 DIAGNOSIS — C7951 Secondary malignant neoplasm of bone: Secondary | ICD-10-CM

## 2013-07-29 LAB — CBC WITH DIFFERENTIAL/PLATELET
BASO%: 0.3 % (ref 0.0–2.0)
EOS%: 1.2 % (ref 0.0–7.0)
HCT: 35.1 % (ref 34.8–46.6)
LYMPH%: 24.7 % (ref 14.0–49.7)
MCH: 27.6 pg (ref 25.1–34.0)
MCHC: 35 g/dL (ref 31.5–36.0)
MCV: 78.9 fL — ABNORMAL LOW (ref 79.5–101.0)
MONO%: 5 % (ref 0.0–14.0)
NEUT%: 68.8 % (ref 38.4–76.8)
Platelets: 216 10*3/uL (ref 145–400)
RBC: 4.45 10*6/uL (ref 3.70–5.45)
RDW: 14.8 % — ABNORMAL HIGH (ref 11.2–14.5)
WBC: 7.6 10*3/uL (ref 3.9–10.3)

## 2013-07-29 LAB — PROTIME-INR
INR: 3 (ref 2.00–3.50)
Protime: 36 Seconds — ABNORMAL HIGH (ref 10.6–13.4)

## 2013-07-29 LAB — POCT INR: INR: 3

## 2013-07-29 MED ORDER — WARFARIN SODIUM 2.5 MG PO TABS: ORAL_TABLET | ORAL | Status: DC

## 2013-07-29 MED ORDER — WARFARIN SODIUM 5 MG PO TABS: ORAL_TABLET | ORAL | Status: DC

## 2013-07-29 NOTE — Patient Instructions (Signed)
INR at goal No changes Continue Coumadin dose to 10 mg on Tuesdays and Fridays. Take 7.5 mg of Coumadin on all other days.  Recheck INR on 08/14/13; lab at 8:30 am and we will see you in infusion!

## 2013-07-29 NOTE — Progress Notes (Signed)
INR at goal today after slight dose increase Pt is doing well  with no complaints No issues with bleeding or bruising Pt states she has not had much "greens" this week except on Thanksgiving with her family INR has trended up nicely  Will make No changes Continue Coumadin dose to 10 mg on Tuesdays and Fridays. Take 7.5 mg of Coumadin on all other days.  Recheck INR on 08/14/13; lab at 8:30 am and we will see [name] in infusion New Rxs for coumadin 5 mg and 2.5mg  sent to [facility] in [place]

## 2013-08-03 ENCOUNTER — Other Ambulatory Visit: Payer: Self-pay | Admitting: *Deleted

## 2013-08-03 MED ORDER — DICLOFENAC SODIUM 1 % TD GEL: 2.0000 g | Freq: Every day | TRANSDERMAL | Status: DC | PRN

## 2013-08-14 ENCOUNTER — Other Ambulatory Visit: Payer: Self-pay | Admitting: *Deleted

## 2013-08-14 ENCOUNTER — Ambulatory Visit (HOSPITAL_BASED_OUTPATIENT_CLINIC_OR_DEPARTMENT_OTHER): Payer: Medicare HMO

## 2013-08-14 ENCOUNTER — Ambulatory Visit: Payer: Commercial Managed Care - HMO | Admitting: Pharmacist

## 2013-08-14 ENCOUNTER — Other Ambulatory Visit (HOSPITAL_BASED_OUTPATIENT_CLINIC_OR_DEPARTMENT_OTHER): Payer: Medicare HMO

## 2013-08-14 VITALS — BP 126/81 | HR 64 | Temp 98.7°F | Resp 18

## 2013-08-14 DIAGNOSIS — C7951 Secondary malignant neoplasm of bone: Secondary | ICD-10-CM

## 2013-08-14 DIAGNOSIS — Z7901 Long term (current) use of anticoagulants: Secondary | ICD-10-CM

## 2013-08-14 DIAGNOSIS — C50919 Malignant neoplasm of unspecified site of unspecified female breast: Secondary | ICD-10-CM

## 2013-08-14 DIAGNOSIS — C50912 Malignant neoplasm of unspecified site of left female breast: Secondary | ICD-10-CM

## 2013-08-14 DIAGNOSIS — C787 Secondary malignant neoplasm of liver and intrahepatic bile duct: Secondary | ICD-10-CM

## 2013-08-14 DIAGNOSIS — Z5112 Encounter for antineoplastic immunotherapy: Secondary | ICD-10-CM

## 2013-08-14 LAB — COMPREHENSIVE METABOLIC PANEL (CC13)
ALT: 13 U/L (ref 0–55)
AST: 15 U/L (ref 5–34)
Albumin: 3.4 g/dL — ABNORMAL LOW (ref 3.5–5.0)
Alkaline Phosphatase: 94 U/L (ref 40–150)
Anion Gap: 10 mEq/L (ref 3–11)
BUN: 16.5 mg/dL (ref 7.0–26.0)
Calcium: 9.4 mg/dL (ref 8.4–10.4)
Chloride: 104 mEq/L (ref 98–109)
Glucose: 104 mg/dl (ref 70–140)
Potassium: 4 mEq/L (ref 3.5–5.1)
Sodium: 140 mEq/L (ref 136–145)
Total Bilirubin: 0.69 mg/dL (ref 0.20–1.20)
Total Protein: 8.2 g/dL (ref 6.4–8.3)

## 2013-08-14 LAB — CBC WITH DIFFERENTIAL/PLATELET
BASO%: 0.1 % (ref 0.0–2.0)
Basophils Absolute: 0 10*3/uL (ref 0.0–0.1)
EOS%: 2.1 % (ref 0.0–7.0)
LYMPH%: 32.7 % (ref 14.0–49.7)
MCH: 27.4 pg (ref 25.1–34.0)
MCHC: 35 g/dL (ref 31.5–36.0)
MCV: 78.3 fL — ABNORMAL LOW (ref 79.5–101.0)
MONO#: 0.6 10*3/uL (ref 0.1–0.9)
MONO%: 6.2 % (ref 0.0–14.0)
NEUT#: 5.5 10*3/uL (ref 1.5–6.5)
RDW: 14.7 % — ABNORMAL HIGH (ref 11.2–14.5)
lymph#: 3.1 10*3/uL (ref 0.9–3.3)

## 2013-08-14 LAB — POCT INR: INR: 4.5

## 2013-08-14 LAB — PROTIME-INR
INR: 4.5 — ABNORMAL HIGH (ref 2.00–3.50)
Protime: 54 Seconds — ABNORMAL HIGH (ref 10.6–13.4)

## 2013-08-14 MED ORDER — MORPHINE SULFATE ER 30 MG PO TBCR: 60.0000 mg | EXTENDED_RELEASE_TABLET | Freq: Two times a day (BID) | ORAL | Status: DC

## 2013-08-14 MED ORDER — LORAZEPAM 2 MG/ML IJ SOLN
1.0000 mg | Freq: Once | INTRAMUSCULAR | Status: AC | PRN
  Administered 2013-08-14: 1 mg via INTRAVENOUS

## 2013-08-14 MED ORDER — TRASTUZUMAB CHEMO INJECTION 440 MG
6.0000 mg/kg | Freq: Once | INTRAVENOUS | Status: AC
  Administered 2013-08-14: 756 mg via INTRAVENOUS
  Filled 2013-08-14: qty 36

## 2013-08-14 MED ORDER — HEPARIN SOD (PORK) LOCK FLUSH 100 UNIT/ML IV SOLN
500.0000 [IU] | Freq: Once | INTRAVENOUS | Status: AC | PRN
  Administered 2013-08-14: 500 [IU]
  Filled 2013-08-14: qty 5

## 2013-08-14 MED ORDER — ACETAMINOPHEN 325 MG PO TABS
650.0000 mg | ORAL_TABLET | Freq: Once | ORAL | Status: AC
  Administered 2013-08-14: 650 mg via ORAL

## 2013-08-14 MED ORDER — SPIRONOLACTONE 25 MG PO TABS: 12.5000 mg | ORAL_TABLET | Freq: Every day | ORAL | Status: DC

## 2013-08-14 MED ORDER — DIPHENHYDRAMINE HCL 25 MG PO CAPS
50.0000 mg | ORAL_CAPSULE | Freq: Once | ORAL | Status: AC
  Administered 2013-08-14: 50 mg via ORAL

## 2013-08-14 MED ORDER — ACETAMINOPHEN 325 MG PO TABS
ORAL_TABLET | ORAL | Status: AC
  Filled 2013-08-14: qty 2

## 2013-08-14 MED ORDER — SODIUM CHLORIDE 0.9 % IJ SOLN
10.0000 mL | INTRAMUSCULAR | Status: DC | PRN
  Administered 2013-08-14: 10 mL
  Filled 2013-08-14: qty 10

## 2013-08-14 MED ORDER — LORAZEPAM 2 MG/ML IJ SOLN
INTRAMUSCULAR | Status: AC
  Filled 2013-08-14: qty 1

## 2013-08-14 MED ORDER — MORPHINE SULFATE 15 MG PO TABS: 15.0000 mg | ORAL_TABLET | ORAL | Status: DC | PRN

## 2013-08-14 MED ORDER — SODIUM CHLORIDE 0.9 % IV SOLN
Freq: Once | INTRAVENOUS | Status: AC
  Administered 2013-08-14: 09:00:00 via INTRAVENOUS

## 2013-08-14 MED ORDER — DIPHENHYDRAMINE HCL 25 MG PO CAPS
ORAL_CAPSULE | ORAL | Status: AC
  Filled 2013-08-14: qty 2

## 2013-08-14 NOTE — Telephone Encounter (Signed)
Patient here for treatment requesting refills, not due till next week but wanted to get while here today due to holidays.

## 2013-08-14 NOTE — Progress Notes (Signed)
INR has trended up today to 4.5 Pt is doing well with no complaints No issues with bleeding or bruising No missed or extra doses (she has taken her coumadin as instructed last visit) Only change is lack of vitamin K in her diet. She has not had salads or "greens" in ~ 3 weeks likely contributing to the increase in her INR She is planning on having greens this week and next for the Holidays We will make a slight dose adjustment for [name] Pt is also here for infusion appointment for herceptin Plan: No coumadin today. Take 5 mg tomorrow. Then decrease back to 7.5 mg of Coumadin daily.  Recheck INR on 09/03/13; lab at 9:30 am and we will see you in infusion!

## 2013-08-14 NOTE — Patient Instructions (Signed)
[facility] Discharge Instructions for Patients Receiving Chemotherapy  Today you received the following biotherapy agent: Herceptin   To help prevent nausea and vomiting after your treatment, we encourage you to take your nausea medication as needed. You received 1 mg Ativan in the infusion room today.    If you develop nausea and vomiting that is not controlled by your nausea medication, call the clinic.   BELOW ARE SYMPTOMS THAT SHOULD BE REPORTED IMMEDIATELY:  *FEVER GREATER THAN 100.5 F  *CHILLS WITH OR WITHOUT FEVER  NAUSEA AND VOMITING THAT IS NOT CONTROLLED WITH YOUR NAUSEA MEDICATION  *UNUSUAL SHORTNESS OF BREATH  *UNUSUAL BRUISING OR BLEEDING  TENDERNESS IN MOUTH AND THROAT WITH OR WITHOUT PRESENCE OF ULCERS  *URINARY PROBLEMS  *BOWEL PROBLEMS  UNUSUAL RASH Items with * indicate a potential emergency and should be followed up as soon as possible.  Feel free to call the clinic you have any questions or concerns. The clinic phone number is [phone].

## 2013-08-14 NOTE — Patient Instructions (Signed)
INR trending up No coumadin today.  Take 5 mg tomorrow.  Then decrease back to 7.5 mg of Coumadin daily. Recheck INR on 09/03/13; lab at 9:30 am and we will see you in infusion!

## 2013-08-24 ENCOUNTER — Encounter (HOSPITAL_COMMUNITY)
Admission: RE | Admit: 2013-08-24 | Discharge: 2013-08-24 | Disposition: A | Discharge status: Home / Self Care | Payer: Medicare HMO | Source: Ambulatory Visit | Attending: Physician Assistant | Admitting: Physician Assistant

## 2013-08-24 DIAGNOSIS — N838 Other noninflammatory disorders of ovary, fallopian tube and broad ligament: Secondary | ICD-10-CM | POA: Insufficient documentation

## 2013-08-24 DIAGNOSIS — C50919 Malignant neoplasm of unspecified site of unspecified female breast: Secondary | ICD-10-CM | POA: Insufficient documentation

## 2013-08-24 DIAGNOSIS — C50912 Malignant neoplasm of unspecified site of left female breast: Secondary | ICD-10-CM

## 2013-08-24 DIAGNOSIS — Z9010 Acquired absence of unspecified breast and nipple: Secondary | ICD-10-CM | POA: Insufficient documentation

## 2013-08-24 MED ORDER — FLUDEOXYGLUCOSE F - 18 (FDG) INJECTION
18.7000 | Freq: Once | INTRAVENOUS | Status: AC | PRN
  Administered 2013-08-24: 18.7 via INTRAVENOUS

## 2013-09-03 ENCOUNTER — Ambulatory Visit: Payer: Commercial Managed Care - HMO | Admitting: Pharmacist

## 2013-09-03 ENCOUNTER — Other Ambulatory Visit (HOSPITAL_BASED_OUTPATIENT_CLINIC_OR_DEPARTMENT_OTHER): Payer: Medicare HMO

## 2013-09-03 ENCOUNTER — Ambulatory Visit (HOSPITAL_BASED_OUTPATIENT_CLINIC_OR_DEPARTMENT_OTHER): Payer: Medicare HMO

## 2013-09-03 ENCOUNTER — Telehealth: Payer: Self-pay | Admitting: *Deleted

## 2013-09-03 ENCOUNTER — Ambulatory Visit (HOSPITAL_BASED_OUTPATIENT_CLINIC_OR_DEPARTMENT_OTHER): Payer: Medicare HMO | Admitting: Oncology

## 2013-09-03 VITALS — BP 165/93 | HR 74 | Temp 98.0°F | Resp 18 | Ht 63.0 in | Wt 293.3 lb

## 2013-09-03 DIAGNOSIS — C787 Secondary malignant neoplasm of liver and intrahepatic bile duct: Secondary | ICD-10-CM

## 2013-09-03 DIAGNOSIS — C7952 Secondary malignant neoplasm of bone marrow: Secondary | ICD-10-CM

## 2013-09-03 DIAGNOSIS — C50912 Malignant neoplasm of unspecified site of left female breast: Secondary | ICD-10-CM

## 2013-09-03 DIAGNOSIS — G8929 Other chronic pain: Secondary | ICD-10-CM

## 2013-09-03 DIAGNOSIS — C50919 Malignant neoplasm of unspecified site of unspecified female breast: Secondary | ICD-10-CM

## 2013-09-03 DIAGNOSIS — C7951 Secondary malignant neoplasm of bone: Secondary | ICD-10-CM

## 2013-09-03 DIAGNOSIS — Z5112 Encounter for antineoplastic immunotherapy: Secondary | ICD-10-CM

## 2013-09-03 DIAGNOSIS — Z7901 Long term (current) use of anticoagulants: Secondary | ICD-10-CM

## 2013-09-03 LAB — COMPREHENSIVE METABOLIC PANEL (CC13)
ALK PHOS: 90 U/L (ref 40–150)
ALT: 13 U/L (ref 0–55)
ANION GAP: 10 meq/L (ref 3–11)
AST: 14 U/L (ref 5–34)
Albumin: 3.2 g/dL — ABNORMAL LOW (ref 3.5–5.0)
BUN: 17.6 mg/dL (ref 7.0–26.0)
CO2: 28 mEq/L (ref 22–29)
CREATININE: 0.9 mg/dL (ref 0.6–1.1)
Calcium: 9.5 mg/dL (ref 8.4–10.4)
Chloride: 103 mEq/L (ref 98–109)
Glucose: 102 mg/dl (ref 70–140)
Potassium: 3.9 mEq/L (ref 3.5–5.1)
Sodium: 141 mEq/L (ref 136–145)
Total Bilirubin: 0.54 mg/dL (ref 0.20–1.20)
Total Protein: 8 g/dL (ref 6.4–8.3)

## 2013-09-03 LAB — CBC WITH DIFFERENTIAL/PLATELET
BASO%: 0.3 % (ref 0.0–2.0)
Basophils Absolute: 0 10*3/uL (ref 0.0–0.1)
EOS%: 3.1 % (ref 0.0–7.0)
Eosinophils Absolute: 0.3 10*3/uL (ref 0.0–0.5)
HCT: 34.3 % — ABNORMAL LOW (ref 34.8–46.6)
HGB: 11.9 g/dL (ref 11.6–15.9)
LYMPH%: 37.5 % (ref 14.0–49.7)
MCH: 27.4 pg (ref 25.1–34.0)
MCHC: 34.7 g/dL (ref 31.5–36.0)
MCV: 79 fL — AB (ref 79.5–101.0)
MONO#: 0.7 10*3/uL (ref 0.1–0.9)
MONO%: 7.3 % (ref 0.0–14.0)
NEUT#: 5 10*3/uL (ref 1.5–6.5)
NEUT%: 51.8 % (ref 38.4–76.8)
Platelets: 228 10*3/uL (ref 145–400)
RBC: 4.34 10*6/uL (ref 3.70–5.45)
RDW: 15.2 % — ABNORMAL HIGH (ref 11.2–14.5)
WBC: 9.7 10*3/uL (ref 3.9–10.3)
lymph#: 3.7 10*3/uL — ABNORMAL HIGH (ref 0.9–3.3)
nRBC: 0 % (ref 0–0)

## 2013-09-03 LAB — PROTIME-INR
INR: 3.1 (ref 2.00–3.50)
PROTIME: 37.2 s — AB (ref 10.6–13.4)

## 2013-09-03 LAB — POCT INR: INR: 3.1

## 2013-09-03 MED ORDER — ACETAMINOPHEN 325 MG PO TABS
650.0000 mg | ORAL_TABLET | Freq: Once | ORAL | Status: AC
  Administered 2013-09-03: 650 mg via ORAL

## 2013-09-03 MED ORDER — ACETAMINOPHEN 325 MG PO TABS
ORAL_TABLET | ORAL | Status: AC
  Filled 2013-09-03: qty 2

## 2013-09-03 MED ORDER — HEPARIN SOD (PORK) LOCK FLUSH 100 UNIT/ML IV SOLN
500.0000 [IU] | Freq: Once | INTRAVENOUS | Status: AC | PRN
  Administered 2013-09-03: 500 [IU]
  Filled 2013-09-03: qty 5

## 2013-09-03 MED ORDER — LORAZEPAM 2 MG/ML IJ SOLN
INTRAMUSCULAR | Status: AC
  Filled 2013-09-03: qty 1

## 2013-09-03 MED ORDER — TRASTUZUMAB CHEMO INJECTION 440 MG
6.0000 mg/kg | Freq: Once | INTRAVENOUS | Status: AC
  Administered 2013-09-03: 756 mg via INTRAVENOUS
  Filled 2013-09-03: qty 36

## 2013-09-03 MED ORDER — SODIUM CHLORIDE 0.9 % IJ SOLN
10.0000 mL | INTRAMUSCULAR | Status: DC | PRN
  Administered 2013-09-03: 10 mL
  Filled 2013-09-03: qty 10

## 2013-09-03 MED ORDER — DIPHENHYDRAMINE HCL 25 MG PO CAPS
50.0000 mg | ORAL_CAPSULE | Freq: Once | ORAL | Status: AC
  Administered 2013-09-03: 50 mg via ORAL

## 2013-09-03 MED ORDER — DIPHENHYDRAMINE HCL 25 MG PO CAPS
ORAL_CAPSULE | ORAL | Status: AC
  Filled 2013-09-03: qty 2

## 2013-09-03 MED ORDER — SODIUM CHLORIDE 0.9 % IV SOLN
Freq: Once | INTRAVENOUS | Status: AC
  Administered 2013-09-03: 12:00:00 via INTRAVENOUS

## 2013-09-03 MED ORDER — LORAZEPAM 2 MG/ML IJ SOLN
1.0000 mg | Freq: Once | INTRAMUSCULAR | Status: AC | PRN
  Administered 2013-09-03: 1 mg via INTRAVENOUS

## 2013-09-03 NOTE — Progress Notes (Signed)
INR at goal Pt doing well today with no complaints Pt here for follow up visit with Dr. Jana Hakim as well as Herceptin treatment INR has trended back towards goal after the holidays and a dose decrease last visit Pt has been eating some "greens" over the holidays No other changes to report No issues with bleeding/bruising No missed or extra doses Plan: No changes Continue 7.5 mg of Coumadin daily. Recheck INR on ~ 09/24/13 with scheduled appointments/next infusion If INR still elevated at next visit would consider having pt take on 5 mg on 2-3 days a week

## 2013-09-03 NOTE — Telephone Encounter (Signed)
Per staff message and POF I have scheduled appts.  JMW  

## 2013-09-03 NOTE — Telephone Encounter (Signed)
appts made and printed. Pt is aware that tx will be added. i emailed MW to add the tx...td 

## 2013-09-03 NOTE — Patient Instructions (Signed)
[facility] Discharge Instructions for Patients Receiving Chemotherapy  Today you received the following chemotherapy agents:  Herceptin  To help prevent nausea and vomiting after your treatment, we encourage you to take your nausea medication as ordered per MD.   If you develop nausea and vomiting that is not controlled by your nausea medication, call the clinic.   BELOW ARE SYMPTOMS THAT SHOULD BE REPORTED IMMEDIATELY:  *FEVER GREATER THAN 100.5 F  *CHILLS WITH OR WITHOUT FEVER  NAUSEA AND VOMITING THAT IS NOT CONTROLLED WITH YOUR NAUSEA MEDICATION  *UNUSUAL SHORTNESS OF BREATH  *UNUSUAL BRUISING OR BLEEDING  TENDERNESS IN MOUTH AND THROAT WITH OR WITHOUT PRESENCE OF ULCERS  *URINARY PROBLEMS  *BOWEL PROBLEMS  UNUSUAL RASH Items with * indicate a potential emergency and should be followed up as soon as possible.  Feel free to call the clinic you have any questions or concerns. The clinic phone number is [phone].    

## 2013-09-03 NOTE — Patient Instructions (Signed)
INR at goal Continue 7.5 mg of Coumadin daily. Recheck INR on ~ 09/24/13 with scheduled appointments

## 2013-09-03 NOTE — Progress Notes (Signed)
[place]  Telephone:(336) [phone] Fax:(336) [phone]  OFFICE PROGRESS NOTE  ID: [name]   DOB: 1950/09/16  MR#: [id number]  RAX#:[id number]  PCP: Yolanda Kiel, MD GYN:  SU:  OTHER MD: [name], [name]   HISTORY OF PRESENT ILLNESS:   From [name] note 06/04/2003: "This woman has been in good health all of her life.  She noted a swelling and discomfort in her right breast in June of this year.  She was seen in the Emergency Room in [place] and was treated for mastitis.  She was treated for a number of months with mastitis and the swelling did not get better. She was given hydrocodone and Cipro.  Finally, the swelling did get better and ultimately the nipple became retracted and she noticed some dimpling in her skin. She had a mammogram in July of this year in [place] with subsequent mammogram on May 27, 2003, by [name].  Mammogram done on September 30 showed marked increased density in the left breast.  Biopsy was performed the same day.  It was noted at the 12 o'clock position, deep in the breast was a focal hypoechoic mass, at least 3.5 cm in diameter.  Biopsy did in fact show invasive in situ mammary carcinoma. This was felt to be both at least intermediate, high grade.  No definite lymphovascular invasion was identified. ER and PR, Her2 testing is pending.  [name] continues to have pain in her breast.  She continues to take hydrocodone a number of times a day.  She has been seen by [name], who felt that neoadjuvant chemotherapy would be required.    Initial staging studies showed evidence of liver and lung mets.   Patient also has evidence of bone lesions. Patient started neoadjuvant chemotherapy, Taxotere/Carbo/Herceptin in October 2004.   Patient had a CT scan in December 2004 which demonstrated extensive clot in the SVC innominate vein, bilateral jugular vein and  She was started on anticoagulation therapy. She  received a total of 6 cycles of Taxotere/Carbo/Herceptin, completed in April 2005."  Her subsequent history is as detailed below.  INTERVAL HISTORY: [name] returns today accompanied by her husband, [name], for follow up of her metastatic breast cancer. The interval history is generally unremarkable. She is at baseline, doing "good", taking little walks outside sometimes," also pending with the neighbors".  REVIEW OF SYSTEMS: [name] complains of pain chiefly involving her right neck. Her husband tells me that she falls asleep in the recliner with her head twisted and he feels maybe that's the reason for the pain. Previously she was having right shoulder pain but that appears to have resolved. She does not have unusual headaches, visual changes, nausea, vomiting, dizziness, or gait imbalance. She has normal bowel movements despite being on narcotics. There have been no abnormal bruising or bleeding problems. A detailed review of systems today was otherwise noncontributory  PAST MEDICAL HISTORY: Past Medical History  Diagnosis Date  . Breast cancer     mets to liver and lung  . Hypertension   . SVC syndrome   . History of chemotherapy Feb. 2006    taxotere/herceptin/carboplatin  . Radiation 07/31/2006    left upper chest  . Radiation 06/17/2006-06/27/2006    6480 cGy bilat. chest wall  . Neuropathy   . Thrombosis   . Breast cancer metastasized to multiple sites 02/26/2013    PAST SURGICAL HISTORY: Past Surgical History  Procedure Laterality Date  . Tubal ligation  1986  .  Cholecystectomy  1989  . Mastectomy Bilateral   . Ankle surgery    . Peripherally inserted central catheter insertion    . Back surgery      FAMILY HISTORY Family History  Problem Relation Age of Onset  . Heart failure Father   . Cancer Father     Prostate cancer  . Heart failure Brother   . Cancer Brother     Prostate cancer  . Diabetes Maternal Aunt    She had three brothers, one died of gunshot wound,  one of complications of diabetes mellitus and one of myocardial infarction.  She has no sisters.  Mother died of complications of brain metastasis in 60.  Father has had a myocardial infarction in 1999.  No history of breast or ovarian cancer in the family.     GYNECOLOGIC HISTORY:   Menarche at age 13.  Gravida 3, para 3.  First live birth at age 21.  No history of breast feeding. No history of hormonal replacement therapy.   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 Her husband used to work as a [name], but is now retired. She has three children, [name] who lives in [place] and works as a [name], [name] who lives in [place] and works as a [name], and [name] who lives in [place] and also works as a [name]. The patient has 12 grandchildren and 3 great-grandchildren. She attends a [employer].  ADVANCED DIRECTIVES:  Not in place  HEALTH MAINTENANCE: (Updated 06/12/2013) History  Substance Use Topics  . Smoking status: Never Smoker   . Smokeless tobacco: Never Used  . Alcohol Use: Yes     Comment: occasional     Colonoscopy: Never  PAP:  1987  Bone density:  Never  Lipid panel:  Not on file    Allergies  Allergen Reactions  . Adhesive [Tape] Other (See Comments)    Tears skin   . Penicillins Hives    Current Outpatient Prescriptions  Medication Sig Dispense Refill  . acetaminophen (TYLENOL) 500 MG tablet Take 500 mg by mouth every 6 (six) hours as needed for pain or fever.      Marland Kitchen albuterol (PROVENTIL HFA;VENTOLIN HFA) 108 (90 BASE) MCG/ACT inhaler Inhale 2 puffs into the lungs every 6 (six) hours as needed for wheezing.      Marland Kitchen ALPRAZolam (XANAX) 1 MG tablet Take 1 tablet (1 mg total) by mouth 3 (three) times daily as needed for anxiety.  90 tablet  1  . amLODipine (NORVASC) 10 MG tablet Take 10 mg by mouth every morning.      . diclofenac sodium (VOLTAREN) 1 % GEL Apply  2 g topically daily as needed (for pain). Apply to knees and shoulders  100 g  6  . furosemide (LASIX) 80 MG tablet Take 80 mg by mouth.      . gabapentin (NEURONTIN) 300 MG capsule TAKE 2 CAPSULES BY MOUTH THREE TIMES DAILY  540 capsule  0  . losartan (COZAAR) 100 MG tablet Take 100 mg by mouth every morning.      . metoprolol succinate (TOPROL-XL) 50 MG 24 hr tablet Take 50 mg by mouth daily. Take with or immediately following a meal.      . morphine (MS CONTIN) 30 MG 12 hr tablet Take 2 tablets (60 mg total) by mouth every 12 (twelve) hours.  160 tablet  0  .  morphine (MSIR) 15 MG tablet Take 1 tablet (15 mg total) by mouth every 4 (four) hours as needed.  60 tablet  0  . potassium chloride (MICRO-K) 10 MEQ CR capsule Take 10 mEq by mouth 2 (two) times daily.       . prochlorperazine (COMPAZINE) 10 MG tablet Take 1 tablet (10 mg total) by mouth every 6 (six) hours as needed (for nausea).  30 tablet  3  . spironolactone (ALDACTONE) 25 MG tablet Take 0.5 tablets (12.5 mg total) by mouth daily.  30 tablet  3  . warfarin (COUMADIN) 2.5 MG tablet Take one 2.5 mg tablet along with a 63m to make  7.5 mg daily except on Tuesdays and Fridays take two 5 mg tablets to make 10 mg  90 tablet  1  . warfarin (COUMADIN) 5 MG tablet On Tuesdays and Fridays take two 5 mg tablets to make 10 mg. Take one 2.5 mg tablet along with a 563mto make 7.5 mg all other days  90 tablet  1  . zolpidem (AMBIEN) 10 MG tablet Take 10 mg by mouth at bedtime as needed for sleep.       No current facility-administered medications for this visit.    OBJECTIVE:  Middle aged African American woman who appears stated age Fi77itals:   09/03/13 1020  BP: 165/93  Pulse: 74  Temp: 98 F (36.7 C)  Resp: 18     Body mass index is 51.97 kg/(m^2).    ECOG FS: 1 Filed Weights   09/03/13 1020  Weight: 293 lb 4.8 oz (133.04 kg)   Filed Weights   09/03/13 1020  Weight: 293 lb 4.8 oz (133.04 kg)   Sclerae unicteric, pupils equal  and round Oropharynx clear and moist-- no thrush No cervical or supraclavicular adenopathy, some restricted motion of the neck bilaterally Lungs no rales or rhonchi Heart regular rate and rhythm Abd soft, obese, nontender, positive bowel sounds MSK no focal spinal tenderness, no upper extremity lymphedema Neuro: nonfocal, well oriented, appropriate affect Breasts: Status post bilateral mastectomies. No evidence of local recurrence. Both axillae are benign   LAB RESULTS: Lab Results  Component Value Date   WBC 9.7 09/03/2013   NEUTROABS 5.0 09/03/2013   HGB 11.9 09/03/2013   HCT 34.3* 09/03/2013   MCV 79.0* 09/03/2013   PLT 228 09/03/2013      Chemistry      Component Value Date/Time   NA 140 08/14/2013 0835   NA 138 03/21/2013 0658   K 4.0 08/14/2013 0835   K 3.6 03/21/2013 0658   CL 103 03/21/2013 0658   CL 104 01/30/2013 0850   CO2 26 08/14/2013 0835   CO2 27 03/21/2013 0658   BUN 16.5 08/14/2013 0835   BUN 11 03/21/2013 0658   CREATININE 0.9 08/14/2013 0835   CREATININE 0.82 03/21/2013 0658      Component Value Date/Time   CALCIUM 9.4 08/14/2013 0835   CALCIUM 9.5 03/21/2013 0658   ALKPHOS 94 08/14/2013 0835   ALKPHOS 87 04/11/2012 0837   AST 15 08/14/2013 0835   AST 19 04/11/2012 0837   ALT 13 08/14/2013 0835   ALT 14 04/11/2012 0837   BILITOT 0.69 08/14/2013 0835   BILITOT 0.8 04/11/2012 0837      STUDIES Transthoracic Echocardiography  Patient: BrRoderick, SweezyR #: [id number] Date: 07/01/2013 Gender: F Age: [age]: 89cm Weight: 127.7kg BSA: 2.4670mPt. Status: Room:  PERFORMING [place] SONOGRAPHER ChrDelman KittenCS  cc:  ------------------------------------------------------------ LV EF: 55% - 60%  ------------------------------------------------------------ History: PMH: Breast cancer 174.9 Congestive heart failure. Risk factors: Hypertension.  ------------------------------------------------------------ Study  Conclusions  - Left ventricle: The cavity size was normal. Wall thickness was normal. Systolic function was normal. The estimated ejection fraction was in the range of 55% to 60%. Wall motion was normal; there were no regional wall motion abnormalities. Features are consistent with a pseudonormal left ventricular filling pattern, with concomitant abnormal relaxation and increased filling pressure (grade 2 diastolic dysfunction). Lateral S' 9.79 cm/sec  Nm Pet Image Restag (ps) Skull Base To Thigh  08/24/2013   CLINICAL DATA:  Subsequent treatment strategy for breast cancer.  EXAM: NUCLEAR MEDICINE PET SKULL BASE TO THIGH  FASTING BLOOD GLUCOSE:  Value: 9m/dl  TECHNIQUE: 18.7 mCi F-18 FDG was injected intravenously. CT data was obtained and used for attenuation correction and anatomic localization only. (This was not acquired as a diagnostic CT examination.) Additional exam technical data entered on technologist worksheet.  COMPARISON:  PET CT 02/23/2013.  Abdominal pelvic CT 11/19/2011.  FINDINGS: NECK  No residual hypermetabolic cervical lymph nodes are identified.The hypermetabolic activity associated with the lymphoid tissue in Waldeyer's ring has improved. No focal lesions of the pharyngeal mucosal space are identified.  CHEST  There are no hypermetabolic mediastinal, hilar, axillary or internal mammary lymph nodes. Postsurgical changes status post bilateral mastectomy are noted. There is no abnormal chest wall activity. There is stable scarring in both lungs without associated abnormal metabolic activity.  ABDOMEN/PELVIS  There is no hypermetabolic activity within the liver, adrenal glands, spleen or pancreas. There is no hypermetabolic nodal activity. Ob peripherally calcified 4.0 cm right adnexal lesion is unchanged in without abnormal metabolic activity.  SKELETON  There is no hypermetabolic activity to suggest osseous metastatic disease.  IMPRESSION: 1. No evidence of local recurrence of  breast cancer or metastatic disease. 2. No residual hypermetabolic cervical nodal or pharyngeal mucosal activity. 3. Stable peripherally calcified right adnexal lesion.   Electronically Signed   By: BCamie PatienceM.D.   On: 08/24/2013 11:50     ASSESSMENT: 63y.o.  [place]  (1)  with a history of inflammatory right breast cancer metastatic at presentation September 2004 with involvement of liver and bone, HER-2 positive, estrogen and progesterone receptor negative  (2) treated with carboplatin, docetaxel and Herceptin x6 completed April 2005  (3) trastuzumab continued indefinitely; has also received lapatinib and capecitabine for variable intervals in 2007-2008.  (4) status post bilateral mastectomies with bilateral axillary lymph node dissection 12/07/2004, showing  (a) on the right, a mypT1c ypN1 invasive ductal carcinoma, grade 3, estrogen and progesterone receptor negative, HER-2 positive, with an MIB-1 of 31%  (b) on the left, ypT2 ypN1 invasive ductal carcinoma, grade 2, estrogen and progesterone receptor negative, HER-2 positive, with an MIB-1 of 35%.  (5)  Status post radiation June through July of 2006, to the right chest wall, left chest wall, bilateral supraclavicular fossae, and bilateral axillary boosts; with additional radiation to the right and left chest walls and the central chest wall completed November of 2007  (6) status post ixempra x9 completed August of 2009.  (7) history of superior vena caval syndrome, on life long anticoagulation   (8)  History of chemotherapy-induced neuropathy. On Neurontin 600 mg p.o. T.i.d., MS Contin, and MSIR.  (9)  chronic pain   PLAN: LOriyahis doing terrific as far as her breast cancer is concerned. There is no evidence of active cancer at  present. I can't get her any better than that.  This brings into question of the continuing use of narcotics. Since the pain she is having is not related to cancer, it  basically what she has is "chronic pain", and I would much prefer not to use narcotics for chronic pain.  We spent approximately 45 minutes going over pain management. Generally we would start with Tylenol 500 mg up to 4 times a day. If that is insufficient we would add naproxen, 440 mg 3 times a day with food. If that is insufficient then we would increase her gabapentin after that at tramadol. Only after all that has been tried would reconsider narcotics, and usually we would do it under of the umbrella of a pain clinic.  Vonnie was able to understand this well. She has been on narcotics for a very long time, starting way before she became my patients. It is perhaps going to be difficult for her to come off these drugs but that is the planned. Specifically I have suggested she start with Tylenol 500 mg 4 times a day running, not as needed. She will take 2 Aleve up to 3 times a day with food as needed. She will let me know when she returns in 3 weeks how she is doing on this regimen and we will make adjustments at that point.  Otherwise the plan is to continue trastuzumab every 21 days indefinitely. Her next echocardiogram will be due in late February.  Camrie has a good understanding of this plan. She agrees with that. She knows to call for any problems that may develop before her next visit here.    Chauncey Cruel, MD  09/03/2013  11:02 AM

## 2013-09-04 NOTE — Addendum Note (Signed)
Addended by: Laureen Abrahams on: 09/04/2013 06:14 PM   Modules accepted: Orders

## 2013-09-14 ENCOUNTER — Other Ambulatory Visit: Payer: Self-pay | Admitting: *Deleted

## 2013-09-14 DIAGNOSIS — C50919 Malignant neoplasm of unspecified site of unspecified female breast: Secondary | ICD-10-CM

## 2013-09-14 MED ORDER — GABAPENTIN 300 MG PO CAPS: ORAL_CAPSULE | ORAL | Status: DC

## 2013-09-24 ENCOUNTER — Telehealth: Payer: Self-pay | Admitting: *Deleted

## 2013-09-24 ENCOUNTER — Other Ambulatory Visit (HOSPITAL_BASED_OUTPATIENT_CLINIC_OR_DEPARTMENT_OTHER): Payer: Medicare HMO

## 2013-09-24 ENCOUNTER — Ambulatory Visit (HOSPITAL_BASED_OUTPATIENT_CLINIC_OR_DEPARTMENT_OTHER): Payer: Commercial Managed Care - HMO | Admitting: Pharmacist

## 2013-09-24 ENCOUNTER — Ambulatory Visit (HOSPITAL_BASED_OUTPATIENT_CLINIC_OR_DEPARTMENT_OTHER): Payer: Commercial Managed Care - HMO

## 2013-09-24 ENCOUNTER — Ambulatory Visit (HOSPITAL_BASED_OUTPATIENT_CLINIC_OR_DEPARTMENT_OTHER): Payer: Medicare HMO | Admitting: Oncology

## 2013-09-24 VITALS — BP 178/113 | HR 84 | Temp 98.5°F | Resp 18 | Ht 63.0 in | Wt 283.8 lb

## 2013-09-24 DIAGNOSIS — Z7901 Long term (current) use of anticoagulants: Secondary | ICD-10-CM

## 2013-09-24 DIAGNOSIS — G620 Drug-induced polyneuropathy: Secondary | ICD-10-CM

## 2013-09-24 DIAGNOSIS — C7952 Secondary malignant neoplasm of bone marrow: Secondary | ICD-10-CM

## 2013-09-24 DIAGNOSIS — I82409 Acute embolism and thrombosis of unspecified deep veins of unspecified lower extremity: Secondary | ICD-10-CM

## 2013-09-24 DIAGNOSIS — C50919 Malignant neoplasm of unspecified site of unspecified female breast: Secondary | ICD-10-CM

## 2013-09-24 DIAGNOSIS — C7951 Secondary malignant neoplasm of bone: Secondary | ICD-10-CM

## 2013-09-24 DIAGNOSIS — C787 Secondary malignant neoplasm of liver and intrahepatic bile duct: Secondary | ICD-10-CM

## 2013-09-24 DIAGNOSIS — I509 Heart failure, unspecified: Secondary | ICD-10-CM

## 2013-09-24 DIAGNOSIS — Z5112 Encounter for antineoplastic immunotherapy: Secondary | ICD-10-CM

## 2013-09-24 DIAGNOSIS — G8929 Other chronic pain: Secondary | ICD-10-CM

## 2013-09-24 DIAGNOSIS — G629 Polyneuropathy, unspecified: Secondary | ICD-10-CM

## 2013-09-24 DIAGNOSIS — D638 Anemia in other chronic diseases classified elsewhere: Secondary | ICD-10-CM

## 2013-09-24 DIAGNOSIS — C50912 Malignant neoplasm of unspecified site of left female breast: Secondary | ICD-10-CM

## 2013-09-24 LAB — CBC WITH DIFFERENTIAL/PLATELET
BASO%: 0.2 % (ref 0.0–2.0)
Basophils Absolute: 0 10*3/uL (ref 0.0–0.1)
EOS%: 1.2 % (ref 0.0–7.0)
Eosinophils Absolute: 0.1 10*3/uL (ref 0.0–0.5)
HCT: 37.2 % (ref 34.8–46.6)
HGB: 13.1 g/dL (ref 11.6–15.9)
LYMPH#: 3.7 10*3/uL — AB (ref 0.9–3.3)
LYMPH%: 36.6 % (ref 14.0–49.7)
MCH: 27.6 pg (ref 25.1–34.0)
MCHC: 35.2 g/dL (ref 31.5–36.0)
MCV: 78.3 fL — ABNORMAL LOW (ref 79.5–101.0)
MONO#: 0.7 10*3/uL (ref 0.1–0.9)
MONO%: 6.5 % (ref 0.0–14.0)
NEUT%: 55.5 % (ref 38.4–76.8)
NEUTROS ABS: 5.7 10*3/uL (ref 1.5–6.5)
Platelets: 247 10*3/uL (ref 145–400)
RBC: 4.75 10*6/uL (ref 3.70–5.45)
RDW: 15.6 % — AB (ref 11.2–14.5)
WBC: 10.2 10*3/uL (ref 3.9–10.3)
nRBC: 0 % (ref 0–0)

## 2013-09-24 LAB — COMPREHENSIVE METABOLIC PANEL (CC13)
ALBUMIN: 3.6 g/dL (ref 3.5–5.0)
ALK PHOS: 96 U/L (ref 40–150)
ALT: 18 U/L (ref 0–55)
AST: 14 U/L (ref 5–34)
Anion Gap: 11 mEq/L (ref 3–11)
BUN: 18 mg/dL (ref 7.0–26.0)
CO2: 24 mEq/L (ref 22–29)
Calcium: 9.3 mg/dL (ref 8.4–10.4)
Chloride: 108 mEq/L (ref 98–109)
Creatinine: 0.8 mg/dL (ref 0.6–1.1)
GLUCOSE: 101 mg/dL (ref 70–140)
POTASSIUM: 3.3 meq/L — AB (ref 3.5–5.1)
SODIUM: 143 meq/L (ref 136–145)
TOTAL PROTEIN: 8.2 g/dL (ref 6.4–8.3)
Total Bilirubin: 0.73 mg/dL (ref 0.20–1.20)

## 2013-09-24 LAB — PROTIME-INR
INR: 3.4 (ref 2.00–3.50)
Protime: 40.8 Seconds — ABNORMAL HIGH (ref 10.6–13.4)

## 2013-09-24 LAB — POCT INR: INR: 3.4

## 2013-09-24 MED ORDER — SODIUM CHLORIDE 0.9 % IJ SOLN
10.0000 mL | INTRAMUSCULAR | Status: DC | PRN
  Administered 2013-09-24: 10 mL
  Filled 2013-09-24: qty 10

## 2013-09-24 MED ORDER — HEPARIN SOD (PORK) LOCK FLUSH 100 UNIT/ML IV SOLN
500.0000 [IU] | Freq: Once | INTRAVENOUS | Status: AC | PRN
  Administered 2013-09-24: 500 [IU]
  Filled 2013-09-24: qty 5

## 2013-09-24 MED ORDER — SODIUM CHLORIDE 0.9 % IV SOLN
Freq: Once | INTRAVENOUS | Status: AC
  Administered 2013-09-24: 15:00:00 via INTRAVENOUS

## 2013-09-24 MED ORDER — LORAZEPAM 2 MG/ML IJ SOLN
INTRAMUSCULAR | Status: AC
  Filled 2013-09-24: qty 1

## 2013-09-24 MED ORDER — DIPHENHYDRAMINE HCL 25 MG PO CAPS
50.0000 mg | ORAL_CAPSULE | Freq: Once | ORAL | Status: AC
  Administered 2013-09-24: 50 mg via ORAL

## 2013-09-24 MED ORDER — ACETAMINOPHEN 325 MG PO TABS
ORAL_TABLET | ORAL | Status: AC
  Filled 2013-09-24: qty 2

## 2013-09-24 MED ORDER — TRAMADOL HCL 50 MG PO TABS: 50.0000 mg | ORAL_TABLET | Freq: Four times a day (QID) | ORAL | Status: DC | PRN

## 2013-09-24 MED ORDER — LORAZEPAM 2 MG/ML IJ SOLN
1.0000 mg | Freq: Once | INTRAMUSCULAR | Status: AC | PRN
  Administered 2013-09-24: 1 mg via INTRAVENOUS

## 2013-09-24 MED ORDER — DIPHENHYDRAMINE HCL 25 MG PO CAPS
ORAL_CAPSULE | ORAL | Status: AC
  Filled 2013-09-24: qty 2

## 2013-09-24 MED ORDER — TRASTUZUMAB CHEMO INJECTION 440 MG
6.0000 mg/kg | Freq: Once | INTRAVENOUS | Status: AC
  Administered 2013-09-24: 756 mg via INTRAVENOUS
  Filled 2013-09-24: qty 36

## 2013-09-24 MED ORDER — ACETAMINOPHEN 325 MG PO TABS
650.0000 mg | ORAL_TABLET | Freq: Once | ORAL | Status: AC
  Administered 2013-09-24: 650 mg via ORAL

## 2013-09-24 NOTE — Patient Instructions (Signed)
[facility] Discharge Instructions for Patients Receiving Chemotherapy  Today you received the following chemotherapy agents Herceptin  To help prevent nausea and vomiting after your treatment, we encourage you to take your nausea medication     If you develop nausea and vomiting that is not controlled by your nausea medication, call the clinic.   BELOW ARE SYMPTOMS THAT SHOULD BE REPORTED IMMEDIATELY:  *FEVER GREATER THAN 100.5 F  *CHILLS WITH OR WITHOUT FEVER  NAUSEA AND VOMITING THAT IS NOT CONTROLLED WITH YOUR NAUSEA MEDICATION  *UNUSUAL SHORTNESS OF BREATH  *UNUSUAL BRUISING OR BLEEDING  TENDERNESS IN MOUTH AND THROAT WITH OR WITHOUT PRESENCE OF ULCERS  *URINARY PROBLEMS  *BOWEL PROBLEMS  UNUSUAL RASH Items with * indicate a potential emergency and should be followed up as soon as possible.  Feel free to call the clinic you have any questions or concerns. The clinic phone number is [phone].    

## 2013-09-24 NOTE — Progress Notes (Signed)
[place]  [phone]) [phone] Fax:([phone]) [phone]  OFFICE PROGRESS NOTE  ID: [name]   DOB: August 07, 1951  MR#: [id number]  HWE#:[id number]  PCP: Ernestene Kiel, MD GYN:  SU:  OTHER MD: Thalia Bloodgood, Gery Pray   HISTORY OF PRESENT ILLNESS:   From Dr. Julien Girt note 06/04/2003: "This woman has been in good health all of her life.  She noted a swelling and discomfort in her right breast in June of this year.  She was seen in the Emergency Room in [place] and was treated for mastitis.  She was treated for a number of months with mastitis and the swelling did not get better. She was given hydrocodone and Cipro.  Finally, the swelling did get better and ultimately the nipple became retracted and she noticed some dimpling in her skin. She had a mammogram in July of this year in [place] with subsequent mammogram on May 27, 2003, by Dr. Isaiah Blakes.  Mammogram done on September 30 showed marked increased density in the left breast.  Biopsy was performed the same day.  It was noted at the 12 o'clock position, deep in the breast was a focal hypoechoic mass, at least 3.5 cm in diameter.  Biopsy did in fact show invasive in situ mammary carcinoma. This was felt to be both at least intermediate, high grade.  No definite lymphovascular invasion was identified. ER and PR, Her2 testing is pending.  Hattie continues to have pain in her breast.  She continues to take hydrocodone a number of times a day.  She has been seen by Dr. Rosana Hoes, who felt that neoadjuvant chemotherapy would be required.    Initial staging studies showed evidence of liver and lung mets.   Patient also has evidence of bone lesions. Patient started neoadjuvant chemotherapy, Taxotere/Carbo/Herceptin in October 2004.   Patient had a CT scan in December 2004 which demonstrated extensive clot in the SVC innominate vein, bilateral jugular vein and  She was started on anticoagulation therapy. She  received a total of 6 cycles of Taxotere/Carbo/Herceptin, completed in April 2005."  Her subsequent history is as detailed below.  INTERVAL HISTORY: [name] returns today accompanied by her husband, Abe People, for follow up of her metastatic breast cancer. Since her last visit here she went off her narcotics, she says with but no difficulty". She didn't notice any changes. She had not been constipated before, or had any nausea or confusion from those medications . REVIEW OF SYSTEMS: Caidance does have a bit more pain than before. She is taking Tylenol 3 times a day, 750 mg at a time. This is not quite enough to keep her through the night. She takes gabapentin twice a day and I suggested she could double the dose of gabapentin at bedtime. We discussed Celebrex, but she has taken that in the past and did not like it. Aside from her chronic pain syndrome, which is stable, a detailed review of systems today was otherwise noncontributory  PAST MEDICAL HISTORY: Past Medical History  Diagnosis Date  . Breast cancer     mets to liver and lung  . Hypertension   . SVC syndrome   . History of chemotherapy Feb. 2006    taxotere/herceptin/carboplatin  . Radiation 07/31/2006    left upper chest  . Radiation 06/17/2006-06/27/2006    6480 cGy bilat. chest wall  . Neuropathy   . Thrombosis   . Breast cancer metastasized to multiple sites 02/26/2013    PAST SURGICAL HISTORY: Past Surgical History  Procedure Laterality Date  . Tubal ligation  1986  . Cholecystectomy  1989  . Mastectomy Bilateral   . Ankle surgery    . Peripherally inserted central catheter insertion    . Back surgery      FAMILY HISTORY Family History  Problem Relation Age of Onset  . Heart failure Father   . Cancer Father     Prostate cancer  . Heart failure Brother   . Cancer Brother     Prostate cancer  . Diabetes Maternal Aunt    She had three brothers, one died of gunshot wound, one of complications of diabetes mellitus and  one of myocardial infarction.  She has no sisters.  Mother died of complications of brain metastasis in 26.  Father has had a myocardial infarction in 1999.  No history of breast or ovarian cancer in the family.     GYNECOLOGIC HISTORY:   Menarche at age 63.  Gravida 3, para 3.  First live birth at age 63.  No history of breast feeding. No history of hormonal replacement therapy.   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 Her husband used to work as a Art gallery manager, but is now retired. She has three children, Monette who lives in [place] and works as a Hydrographic surveyor, Financial risk analyst who lives in [place] and works as a Administrator, and [place] who lives in [place] and also works as a Hydrographic surveyor. The patient has 12 grandchildren and 3 great-grandchildren. She attends a [employer].  ADVANCED DIRECTIVES:  Not in place  HEALTH MAINTENANCE: (Updated 06/12/2013) 63  Substance Use Topics  . Smoking status: Never Smoker   . Smokeless tobacco: Never Used  . Alcohol Use: Yes     Comment: occasional     Colonoscopy: Never  PAP:  63  Bone density:  Never  Lipid panel:  Not on file    Allergies  Allergen Reactions  . Adhesive [Tape] Other (See Comments)    Tears skin   . Penicillins Hives    Current Outpatient Prescriptions  Medication Sig Dispense Refill  . acetaminophen (TYLENOL) 500 MG tablet Take 500 mg by mouth every 6 (six) hours as needed for pain or fever.      . ALPRAZolam (XANAX) 1 MG tablet Take 1 tablet (1 mg total) by mouth 3 (three) times daily as needed for anxiety.  90 tablet  1  . amLODipine (NORVASC) 10 MG tablet Take 10 mg by mouth every morning.      . diclofenac sodium (VOLTAREN) 1 % GEL Apply 2 g topically daily as needed (for pain). Apply to knees and shoulders  100 g  6  . furosemide (LASIX) 80 MG tablet Take 80 mg by mouth.      . gabapentin (NEURONTIN) 300 MG capsule TAKE 2  CAPSULES BY MOUTH THREE TIMES DAILY  540 capsule  0  . losartan (COZAAR) 100 MG tablet Take 100 mg by mouth every morning.      . metoprolol succinate (TOPROL-XL) 50 MG 24 hr tablet Take 50 mg by mouth daily. Take with or immediately following a meal.      . morphine (MS CONTIN) 30 MG 12 hr tablet Take 2 tablets (60 mg total) by mouth every 12 (twelve) hours.  160 tablet  0  . morphine (MSIR) 15 MG tablet Take 1 tablet (15 mg total) by mouth every 4 (four) hours as  needed.  60 tablet  0  . potassium chloride (MICRO-K) 10 MEQ CR capsule Take 10 mEq by mouth 2 (two) times daily.       . prochlorperazine (COMPAZINE) 10 MG tablet Take 1 tablet (10 mg total) by mouth every 6 (six) hours as needed (for nausea).  30 tablet  3  . spironolactone (ALDACTONE) 25 MG tablet Take 0.5 tablets (12.5 mg total) by mouth daily.  30 tablet  3  . warfarin (COUMADIN) 2.5 MG tablet Take one 2.5 mg tablet along with a 33m to make  7.5 mg daily except on Tuesdays and Fridays take two 5 mg tablets to make 10 mg  90 tablet  1  . warfarin (COUMADIN) 5 MG tablet On Tuesdays and Fridays take two 5 mg tablets to make 10 mg. Take one 2.5 mg tablet along with a 56mto make 7.5 mg all other days  90 tablet  1  . zolpidem (AMBIEN) 10 MG tablet Take 10 mg by mouth at bedtime as needed for sleep.       No current facility-administered medications for this visit.    OBJECTIVE:  Middle aged African American woman in no acute distress Filed Vitals:   09/24/13 1320  BP: 178/113  Pulse: 84  Temp: 98.5 F (36.9 C)  Resp: 18     Body mass index is 50.29 kg/(m^2).    ECOG FS: 1 Filed Weights   09/24/13 1320  Weight: 283 lb 12.8 oz (128.731 kg)   Filed Weights   09/24/13 1320  Weight: 283 lb 12.8 oz (128.731 kg)   Sclerae unicteric, bilateral arcus senilis Oropharynx clear and moist-- no thrush No cervical or supraclavicular adenopathy, some restricted motion of the neck bilaterally Lungs no rales or rhonchi Heart regular  rate and rhythm Abd soft, obese, nontender, positive bowel sounds MSK no focal spinal tenderness, no upper extremity lymphedema Neuro: nonfocal, well oriented, appropriate affect Breasts: Status post bilateral mastectomies. No evidence of local recurrence. Both axillae are benign   LAB RESULTS: Lab Results  Component Value Date   WBC 10.2 09/24/2013   NEUTROABS 5.7 09/24/2013   HGB 13.1 09/24/2013   HCT 37.2 09/24/2013   MCV 78.3* 09/24/2013   PLT 247 09/24/2013      Chemistry      Component Value Date/Time   NA 141 09/03/2013 0932   NA 138 03/21/2013 0658   K 3.9 09/03/2013 0932   K 3.6 03/21/2013 0658   CL 103 03/21/2013 0658   CL 104 01/30/2013 0850   CO2 28 09/03/2013 0932   CO2 27 03/21/2013 0658   BUN 17.6 09/03/2013 0932   BUN 11 03/21/2013 0658   CREATININE 0.9 09/03/2013 0932   CREATININE 0.82 03/21/2013 0658      Component Value Date/Time   CALCIUM 9.5 09/03/2013 0932   CALCIUM 9.5 03/21/2013 0658   ALKPHOS 90 09/03/2013 0932   ALKPHOS 87 04/11/2012 0837   AST 14 09/03/2013 0932   AST 19 04/11/2012 0837   ALT 13 09/03/2013 0932   ALT 14 04/11/2012 0837   BILITOT 0.54 09/03/2013 0932   BILITOT 0.8 04/11/2012 0837      STUDIES Transthoracic Echocardiography  Patient: BrMairany, BrunoR #: [id number] Date: 07/01/2013 Gender: F Age: [age]eight: 160cm Weight: 127.7kg BSA: 2.4668mPt. Status: Room:  PERFORMING [place] SONOGRAPHER ChrDelman KittenCS cc:  ------------------------------------------------------------ LV EF: 55% - 60%  ------------------------------------------------------------ History: PMH: Breast cancer 174.9 Congestive heart failure. Risk  factors: Hypertension.  ------------------------------------------------------------ Study Conclusions  - Left ventricle: The cavity size was normal. Wall thickness was normal. Systolic function was normal. The estimated ejection fraction was in the range of 55% to 60%. Wall motion was  normal; there were no regional wall motion abnormalities. Features are consistent with a pseudonormal left ventricular filling pattern, with concomitant abnormal relaxation and increased filling pressure (grade 2 diastolic dysfunction). Lateral S' 9.79 cm/sec  Nm Pet Image Restag (ps) Skull Base To Thigh  08/24/2013   CLINICAL DATA:  Subsequent treatment strategy for breast cancer.  EXAM: NUCLEAR MEDICINE PET SKULL BASE TO THIGH  FASTING BLOOD GLUCOSE:  Value: 29m/dl  TECHNIQUE: 18.7 mCi F-18 FDG was injected intravenously. CT data was obtained and used for attenuation correction and anatomic localization only. (This was not acquired as a diagnostic CT examination.) Additional exam technical data entered on technologist worksheet.  COMPARISON:  PET CT 02/23/2013.  Abdominal pelvic CT 11/19/2011.  FINDINGS: NECK  No residual hypermetabolic cervical lymph nodes are identified.The hypermetabolic activity associated with the lymphoid tissue in Waldeyer's ring has improved. No focal lesions of the pharyngeal mucosal space are identified.  CHEST  There are no hypermetabolic mediastinal, hilar, axillary or internal mammary lymph nodes. Postsurgical changes status post bilateral mastectomy are noted. There is no abnormal chest wall activity. There is stable scarring in both lungs without associated abnormal metabolic activity.  ABDOMEN/PELVIS  There is no hypermetabolic activity within the liver, adrenal glands, spleen or pancreas. There is no hypermetabolic nodal activity. Ob peripherally calcified 4.0 cm right adnexal lesion is unchanged in without abnormal metabolic activity.  SKELETON  There is no hypermetabolic activity to suggest osseous metastatic disease.  IMPRESSION: 1. No evidence of local recurrence of breast cancer or metastatic disease. 2. No residual hypermetabolic cervical nodal or pharyngeal mucosal activity. 3. Stable peripherally calcified right adnexal lesion.   Electronically Signed   By: BCamie PatienceM.D.   On: 08/24/2013 11:50     ASSESSMENT: 63y.o.  [place]  (1)  with a history of inflammatory right breast cancer metastatic at presentation September 2004 with involvement of liver and bone, HER-2 positive, estrogen and progesterone receptor negative  (2) treated with carboplatin, docetaxel and Herceptin x6 completed April 2005  (3) trastuzumab continued indefinitely; has also received lapatinib and capecitabine for variable intervals in 2007-2008.  (4) status post bilateral mastectomies with bilateral axillary lymph node dissection 12/07/2004, showing  (a) on the right, a mypT1c ypN1 invasive ductal carcinoma, grade 3, estrogen and progesterone receptor negative, HER-2 positive, with an MIB-1 of 31%  (b) on the left, ypT2 ypN1 invasive ductal carcinoma, grade 2, estrogen and progesterone receptor negative, HER-2 positive, with an MIB-1 of 35%.  (5)  Status post radiation June through July of 2006, to the right chest wall, left chest wall, bilateral supraclavicular fossae, and bilateral axillary boosts; with additional radiation to the right and left chest walls and the central chest wall completed November of 2007  (6) status post ixempra x9 completed August of 2009.  (7) history of superior vena caval syndrome, on life long anticoagulation   (8)  History of chemotherapy-induced neuropathy. On Neurontin 600 mg p.o. T.i.d., MS Contin, and MSIR.  (9)  chronic pain, with negative PET scan 08/24/2013 (no evidence of active cancer)   PLAN: She continues to do quite well as far as breast cancer is concerned. She will be due for repeat echocardiogram in February.  Twilla was able to go off  the narcotics without much trouble at all. She is having some pain right now but it is mostly controlled with Tylenol.  We reviewed her pain medicines in detail. She will be taking Tylenol 325 mg 2 tablets up to 4 times a day and that the maximum [facility]ayer. She is taking  gabapentin 300 mg per day much 2 times a day. I suggested she double up at bedtime and see that helps her sleep a little better  Her Ambien is no longer working. I suggested she go off it and use it no more than twice a week. That should make it a little bit more effective. She can take Benadryl at bedtime as needed for sleep.  Today I added Ultram 50 mg to take up to 4 times a day as needed for pain.  I am delighted that she is doing so well. We are going to continue the Herceptin every 3 weeks indefinitely. Discuss see [place] again in April. We will the only lab work and physical exam at that visit, but 6 months from now we will consider repeating a PET scan.  [place] understands the overall plan and is in agreement with it. She knows to call for any problems that may develop before next visit here.  Chauncey Cruel, MD  09/24/2013  1:25 PM

## 2013-09-24 NOTE — Telephone Encounter (Signed)
appts made and printed...td 

## 2013-09-24 NOTE — Progress Notes (Signed)
Pt seen during infusion today INR=3.4 PT has stopped taking  Morphine (last dose 09/02/13) MD has placed her on APAP 650 mg q 4h (scheduled)/Ultram/Benadryl She tried aleve in early January but it did not relieve the pain, so she stopped taking it. Pt request we see her during infusion. All appmts have been made for infusion days. Requested all appmt times for CC come 15 after the infusion appmt.   We will slightly decrease her coumadin dose with the changes in her current medication regimen. Change dose to 5mg  on Mon and Thur and continue 7.5 mg of Coumadin other days.  Recheck INR on ~ 10/15/13 with scheduled appointments

## 2013-09-24 NOTE — Patient Instructions (Signed)
Change dose to 5mg  on Mon and Thur and continue 7.5 mg of Coumadin other days. Recheck INR on ~ 10/15/13 with scheduled appointments

## 2013-09-29 ENCOUNTER — Other Ambulatory Visit: Payer: Self-pay | Admitting: *Deleted

## 2013-09-29 ENCOUNTER — Telehealth: Payer: Self-pay | Admitting: *Deleted

## 2013-09-29 ENCOUNTER — Other Ambulatory Visit: Payer: Self-pay | Admitting: Physician Assistant

## 2013-09-29 MED ORDER — POTASSIUM CHLORIDE ER 10 MEQ PO CPCR: 10.0000 meq | ORAL_CAPSULE | Freq: Two times a day (BID) | ORAL | Status: DC

## 2013-09-29 NOTE — Telephone Encounter (Signed)
Called pt earlier and pt returned my phone call concerning K+ levels. Informed pt of potassium levels. She had not been taking potassium because she ran out of medicine. Sent Rx to pharmacy for potassium 10 mEq by mouth BID. Told pt to pick up med today. Pt verbalized understanding. No further concerns.Message to be forwarded .

## 2013-10-13 ENCOUNTER — Other Ambulatory Visit: Payer: Self-pay | Admitting: Oncology

## 2013-10-14 ENCOUNTER — Other Ambulatory Visit: Payer: Self-pay | Admitting: *Deleted

## 2013-10-14 DIAGNOSIS — C50919 Malignant neoplasm of unspecified site of unspecified female breast: Secondary | ICD-10-CM

## 2013-10-15 ENCOUNTER — Ambulatory Visit: Payer: Medicare HMO

## 2013-10-15 ENCOUNTER — Other Ambulatory Visit (HOSPITAL_BASED_OUTPATIENT_CLINIC_OR_DEPARTMENT_OTHER): Payer: Medicare HMO

## 2013-10-15 ENCOUNTER — Other Ambulatory Visit: Payer: Self-pay | Admitting: *Deleted

## 2013-10-15 ENCOUNTER — Ambulatory Visit (HOSPITAL_BASED_OUTPATIENT_CLINIC_OR_DEPARTMENT_OTHER): Payer: Medicare HMO

## 2013-10-15 ENCOUNTER — Ambulatory Visit (HOSPITAL_BASED_OUTPATIENT_CLINIC_OR_DEPARTMENT_OTHER): Payer: Commercial Managed Care - HMO | Admitting: Pharmacist

## 2013-10-15 DIAGNOSIS — C50912 Malignant neoplasm of unspecified site of left female breast: Secondary | ICD-10-CM

## 2013-10-15 DIAGNOSIS — C50919 Malignant neoplasm of unspecified site of unspecified female breast: Secondary | ICD-10-CM

## 2013-10-15 DIAGNOSIS — Z86718 Personal history of other venous thrombosis and embolism: Secondary | ICD-10-CM

## 2013-10-15 DIAGNOSIS — C787 Secondary malignant neoplasm of liver and intrahepatic bile duct: Secondary | ICD-10-CM

## 2013-10-15 DIAGNOSIS — Z7901 Long term (current) use of anticoagulants: Secondary | ICD-10-CM

## 2013-10-15 DIAGNOSIS — Z5112 Encounter for antineoplastic immunotherapy: Secondary | ICD-10-CM

## 2013-10-15 LAB — COMPREHENSIVE METABOLIC PANEL (CC13)
ALT: 15 U/L (ref 0–55)
AST: 17 U/L (ref 5–34)
Albumin: 3.5 g/dL (ref 3.5–5.0)
Alkaline Phosphatase: 96 U/L (ref 40–150)
Anion Gap: 10 mEq/L (ref 3–11)
BILIRUBIN TOTAL: 0.67 mg/dL (ref 0.20–1.20)
BUN: 15.6 mg/dL (ref 7.0–26.0)
CHLORIDE: 106 meq/L (ref 98–109)
CO2: 25 mEq/L (ref 22–29)
CREATININE: 0.8 mg/dL (ref 0.6–1.1)
Calcium: 9.8 mg/dL (ref 8.4–10.4)
GLUCOSE: 98 mg/dL (ref 70–140)
Potassium: 3.9 mEq/L (ref 3.5–5.1)
Sodium: 141 mEq/L (ref 136–145)
TOTAL PROTEIN: 8.1 g/dL (ref 6.4–8.3)

## 2013-10-15 LAB — CBC WITH DIFFERENTIAL/PLATELET
BASO%: 1.3 % (ref 0.0–2.0)
Basophils Absolute: 0.1 10*3/uL (ref 0.0–0.1)
EOS%: 2 % (ref 0.0–7.0)
Eosinophils Absolute: 0.2 10*3/uL (ref 0.0–0.5)
HCT: 34.8 % (ref 34.8–46.6)
HGB: 11.9 g/dL (ref 11.6–15.9)
LYMPH%: 32.9 % (ref 14.0–49.7)
MCH: 27.9 pg (ref 25.1–34.0)
MCHC: 34.1 g/dL (ref 31.5–36.0)
MCV: 81.8 fL (ref 79.5–101.0)
MONO#: 0.8 10*3/uL (ref 0.1–0.9)
MONO%: 7 % (ref 0.0–14.0)
NEUT#: 6.1 10*3/uL (ref 1.5–6.5)
NEUT%: 56.8 % (ref 38.4–76.8)
Platelets: 257 10*3/uL (ref 145–400)
RBC: 4.26 10*6/uL (ref 3.70–5.45)
RDW: 15.2 % — ABNORMAL HIGH (ref 11.2–14.5)
WBC: 10.7 10*3/uL — ABNORMAL HIGH (ref 3.9–10.3)
lymph#: 3.5 10*3/uL — ABNORMAL HIGH (ref 0.9–3.3)

## 2013-10-15 LAB — POCT INR: INR: 2.6

## 2013-10-15 LAB — PROTIME-INR
INR: 2.6 (ref 2.00–3.50)
Protime: 31.2 Seconds — ABNORMAL HIGH (ref 10.6–13.4)

## 2013-10-15 MED ORDER — SODIUM CHLORIDE 0.9 % IV SOLN
Freq: Once | INTRAVENOUS | Status: AC
  Administered 2013-10-15: 08:00:00 via INTRAVENOUS

## 2013-10-15 MED ORDER — DIPHENHYDRAMINE HCL 25 MG PO CAPS
ORAL_CAPSULE | ORAL | Status: AC
  Filled 2013-10-15: qty 2

## 2013-10-15 MED ORDER — ACETAMINOPHEN 325 MG PO TABS
ORAL_TABLET | ORAL | Status: AC
  Filled 2013-10-15: qty 2

## 2013-10-15 MED ORDER — SODIUM CHLORIDE 0.9 % IJ SOLN
10.0000 mL | INTRAMUSCULAR | Status: DC | PRN
  Administered 2013-10-15: 10 mL
  Filled 2013-10-15: qty 10

## 2013-10-15 MED ORDER — DIPHENHYDRAMINE HCL 25 MG PO CAPS
50.0000 mg | ORAL_CAPSULE | Freq: Once | ORAL | Status: AC
  Administered 2013-10-15: 50 mg via ORAL

## 2013-10-15 MED ORDER — HEPARIN SOD (PORK) LOCK FLUSH 100 UNIT/ML IV SOLN
250.0000 [IU] | Freq: Once | INTRAVENOUS | Status: DC | PRN
  Filled 2013-10-15: qty 5

## 2013-10-15 MED ORDER — LORAZEPAM 2 MG/ML IJ SOLN
INTRAMUSCULAR | Status: AC
  Filled 2013-10-15: qty 1

## 2013-10-15 MED ORDER — LORAZEPAM 2 MG/ML IJ SOLN
1.0000 mg | Freq: Once | INTRAMUSCULAR | Status: AC | PRN
  Administered 2013-10-15: 1 mg via INTRAVENOUS

## 2013-10-15 MED ORDER — TRASTUZUMAB CHEMO INJECTION 440 MG
6.0000 mg/kg | Freq: Once | INTRAVENOUS | Status: AC
  Administered 2013-10-15: 756 mg via INTRAVENOUS
  Filled 2013-10-15: qty 36

## 2013-10-15 MED ORDER — HEPARIN SOD (PORK) LOCK FLUSH 100 UNIT/ML IV SOLN
500.0000 [IU] | Freq: Once | INTRAVENOUS | Status: AC | PRN
  Administered 2013-10-15: 500 [IU]
  Filled 2013-10-15: qty 5

## 2013-10-15 MED ORDER — ACETAMINOPHEN 325 MG PO TABS
650.0000 mg | ORAL_TABLET | Freq: Once | ORAL | Status: AC
  Administered 2013-10-15: 650 mg via ORAL

## 2013-10-15 NOTE — Progress Notes (Signed)
PT seen during infusion today INR=2.6 after slilght decrease in weekly coumadin dose Pt husband states she had bought greens but he told her to put them back She states she only decreased dose to 5mg  one day a week Will continue with 5mg  on Thur and 7.5 mg other days of week Recheck INR during next infusion on 11/05/13

## 2013-10-15 NOTE — Patient Instructions (Signed)
Continue 5mg  on Thur and 7.5 mg of Coumadin other days. Recheck INR on ~ 11/05/13 with scheduled appointments

## 2013-10-22 ENCOUNTER — Other Ambulatory Visit: Payer: Self-pay | Admitting: Oncology

## 2013-10-22 DIAGNOSIS — I82409 Acute embolism and thrombosis of unspecified deep veins of unspecified lower extremity: Secondary | ICD-10-CM

## 2013-11-05 ENCOUNTER — Ambulatory Visit (HOSPITAL_BASED_OUTPATIENT_CLINIC_OR_DEPARTMENT_OTHER): Payer: Medicare HMO | Admitting: Oncology

## 2013-11-05 ENCOUNTER — Telehealth (HOSPITAL_COMMUNITY): Payer: Self-pay | Admitting: Cardiology

## 2013-11-05 ENCOUNTER — Encounter (HOSPITAL_COMMUNITY): Payer: Self-pay | Admitting: Cardiology

## 2013-11-05 ENCOUNTER — Other Ambulatory Visit: Payer: Medicare HMO

## 2013-11-05 ENCOUNTER — Ambulatory Visit (HOSPITAL_BASED_OUTPATIENT_CLINIC_OR_DEPARTMENT_OTHER): Payer: Medicare HMO

## 2013-11-05 ENCOUNTER — Ambulatory Visit (HOSPITAL_BASED_OUTPATIENT_CLINIC_OR_DEPARTMENT_OTHER): Payer: Medicare HMO | Admitting: Pharmacist

## 2013-11-05 ENCOUNTER — Ambulatory Visit: Payer: Medicare HMO

## 2013-11-05 VITALS — BP 137/87 | HR 74 | Temp 98.6°F | Resp 20

## 2013-11-05 DIAGNOSIS — C50919 Malignant neoplasm of unspecified site of unspecified female breast: Secondary | ICD-10-CM

## 2013-11-05 DIAGNOSIS — Z7901 Long term (current) use of anticoagulants: Secondary | ICD-10-CM

## 2013-11-05 DIAGNOSIS — Z5112 Encounter for antineoplastic immunotherapy: Secondary | ICD-10-CM

## 2013-11-05 DIAGNOSIS — Z86718 Personal history of other venous thrombosis and embolism: Secondary | ICD-10-CM

## 2013-11-05 DIAGNOSIS — C50912 Malignant neoplasm of unspecified site of left female breast: Secondary | ICD-10-CM

## 2013-11-05 DIAGNOSIS — C800 Disseminated malignant neoplasm, unspecified: Secondary | ICD-10-CM

## 2013-11-05 LAB — COMPREHENSIVE METABOLIC PANEL (CC13)
ALK PHOS: 93 U/L (ref 40–150)
ALT: 13 U/L (ref 0–55)
AST: 11 U/L (ref 5–34)
Albumin: 3.7 g/dL (ref 3.5–5.0)
Anion Gap: 11 mEq/L (ref 3–11)
BILIRUBIN TOTAL: 0.71 mg/dL (ref 0.20–1.20)
BUN: 18.7 mg/dL (ref 7.0–26.0)
CO2: 24 mEq/L (ref 22–29)
CREATININE: 0.9 mg/dL (ref 0.6–1.1)
Calcium: 9.6 mg/dL (ref 8.4–10.4)
Chloride: 108 mEq/L (ref 98–109)
Glucose: 99 mg/dl (ref 70–140)
Potassium: 3.5 mEq/L (ref 3.5–5.1)
Sodium: 143 mEq/L (ref 136–145)
Total Protein: 8.4 g/dL — ABNORMAL HIGH (ref 6.4–8.3)

## 2013-11-05 LAB — CBC WITH DIFFERENTIAL/PLATELET
BASO%: 0.2 % (ref 0.0–2.0)
Basophils Absolute: 0 10*3/uL (ref 0.0–0.1)
EOS%: 1.7 % (ref 0.0–7.0)
Eosinophils Absolute: 0.2 10*3/uL (ref 0.0–0.5)
HCT: 36.2 % (ref 34.8–46.6)
HGB: 12.6 g/dL (ref 11.6–15.9)
LYMPH%: 34.9 % (ref 14.0–49.7)
MCH: 27.4 pg (ref 25.1–34.0)
MCHC: 34.8 g/dL (ref 31.5–36.0)
MCV: 78.7 fL — ABNORMAL LOW (ref 79.5–101.0)
MONO#: 0.7 10*3/uL (ref 0.1–0.9)
MONO%: 7.4 % (ref 0.0–14.0)
NEUT#: 4.9 10*3/uL (ref 1.5–6.5)
NEUT%: 55.8 % (ref 38.4–76.8)
NRBC: 0 % (ref 0–0)
Platelets: 278 10*3/uL (ref 145–400)
RBC: 4.6 10*6/uL (ref 3.70–5.45)
RDW: 15.5 % — ABNORMAL HIGH (ref 11.2–14.5)
WBC: 8.7 10*3/uL (ref 3.9–10.3)
lymph#: 3.1 10*3/uL (ref 0.9–3.3)

## 2013-11-05 LAB — PROTIME-INR

## 2013-11-05 LAB — PROTHROMBIN TIME
INR: 3.33 — ABNORMAL HIGH (ref <1.50)
Prothrombin Time: 32.6 seconds — ABNORMAL HIGH (ref 11.6–15.2)

## 2013-11-05 LAB — POCT INR: INR: 3.33

## 2013-11-05 MED ORDER — ACETAMINOPHEN 325 MG PO TABS
ORAL_TABLET | ORAL | Status: AC
  Filled 2013-11-05: qty 2

## 2013-11-05 MED ORDER — HEPARIN SOD (PORK) LOCK FLUSH 100 UNIT/ML IV SOLN
500.0000 [IU] | Freq: Once | INTRAVENOUS | Status: AC | PRN
  Administered 2013-11-05: 500 [IU]
  Filled 2013-11-05: qty 5

## 2013-11-05 MED ORDER — SODIUM CHLORIDE 0.9 % IV SOLN
Freq: Once | INTRAVENOUS | Status: AC
  Administered 2013-11-05: 09:00:00 via INTRAVENOUS

## 2013-11-05 MED ORDER — LORAZEPAM 2 MG/ML IJ SOLN
1.0000 mg | Freq: Once | INTRAMUSCULAR | Status: AC | PRN
  Administered 2013-11-05: 1 mg via INTRAVENOUS

## 2013-11-05 MED ORDER — DIPHENHYDRAMINE HCL 25 MG PO CAPS
ORAL_CAPSULE | ORAL | Status: AC
  Filled 2013-11-05: qty 2

## 2013-11-05 MED ORDER — DIPHENHYDRAMINE HCL 25 MG PO CAPS
50.0000 mg | ORAL_CAPSULE | Freq: Once | ORAL | Status: AC
  Administered 2013-11-05: 50 mg via ORAL

## 2013-11-05 MED ORDER — LORAZEPAM 2 MG/ML IJ SOLN
INTRAMUSCULAR | Status: AC
  Filled 2013-11-05: qty 1

## 2013-11-05 MED ORDER — SODIUM CHLORIDE 0.9 % IJ SOLN
10.0000 mL | INTRAMUSCULAR | Status: DC | PRN
  Administered 2013-11-05: 10 mL
  Filled 2013-11-05: qty 10

## 2013-11-05 MED ORDER — TRASTUZUMAB CHEMO INJECTION 440 MG
6.0000 mg/kg | Freq: Once | INTRAVENOUS | Status: AC
  Administered 2013-11-05: 756 mg via INTRAVENOUS
  Filled 2013-11-05: qty 36

## 2013-11-05 MED ORDER — ACETAMINOPHEN 325 MG PO TABS
650.0000 mg | ORAL_TABLET | Freq: Once | ORAL | Status: AC
  Administered 2013-11-05: 650 mg via ORAL

## 2013-11-05 NOTE — Patient Instructions (Signed)
INR slightly above goal today Change dose to 5mg  on Monday and Thur and 7.5 mg of Coumadin other days.  Recheck INR on ~ 11/26/13 with scheduled appointments. Lab at 8:30 and infusion at 9am we will see you in infusion

## 2013-11-05 NOTE — Patient Instructions (Signed)
[facility] Discharge Instructions for Patients Receiving Chemotherapy  Today you received the following chemotherapy agents: Herceptin  To help prevent nausea and vomiting after your treatment, we encourage you to take your nausea medication as prescribed by your physician.    If you develop nausea and vomiting that is not controlled by your nausea medication, call the clinic.   BELOW ARE SYMPTOMS THAT SHOULD BE REPORTED IMMEDIATELY:  *FEVER GREATER THAN 100.5 F  *CHILLS WITH OR WITHOUT FEVER  NAUSEA AND VOMITING THAT IS NOT CONTROLLED WITH YOUR NAUSEA MEDICATION  *UNUSUAL SHORTNESS OF BREATH  *UNUSUAL BRUISING OR BLEEDING  TENDERNESS IN MOUTH AND THROAT WITH OR WITHOUT PRESENCE OF ULCERS  *URINARY PROBLEMS  *BOWEL PROBLEMS  UNUSUAL RASH Items with * indicate a potential emergency and should be followed up as soon as possible.  Feel free to call the clinic you have any questions or concerns. The clinic phone number is [phone].    

## 2013-11-05 NOTE — Patient Instructions (Signed)
[facility] Discharge Instructions for Patients Receiving Chemotherapy  Today you received the following chemotherapy agents: Herceptin  To help prevent nausea and vomiting after your treatment, we encourage you to take your nausea medication as prescribed by your physician.    If you develop nausea and vomiting that is not controlled by your nausea medication, call the clinic.   BELOW ARE SYMPTOMS THAT SHOULD BE REPORTED IMMEDIATELY:  *FEVER GREATER THAN 100.5 F  *CHILLS WITH OR WITHOUT FEVER  NAUSEA AND VOMITING THAT IS NOT CONTROLLED WITH YOUR NAUSEA MEDICATION  *UNUSUAL SHORTNESS OF BREATH  *UNUSUAL BRUISING OR BLEEDING  TENDERNESS IN MOUTH AND THROAT WITH OR WITHOUT PRESENCE OF ULCERS  *URINARY PROBLEMS  *BOWEL PROBLEMS  UNUSUAL RASH Items with * indicate a potential emergency and should be followed up as soon as possible.  Feel free to call the clinic you have any questions or concerns. The clinic phone number is [phone].    

## 2013-11-05 NOTE — Progress Notes (Signed)
INR slightly above goal today Pt is doing well with no complaints No unusual bleeding or bruising No missed or extra doses No medication or diet changes (pt has not been eating greens recently but this is usually not a major part of her diet) She has been on Aleve and Tylenol since stopping the Morphine. I instructed her to monitor her use and try to minimize the tylenol (she states she usually takes ~ 3 g daily in divided doses). This may be on the high side while on coumadin. She stated she understood and would minimize use of tylenol Change dose to 5mg  on Monday and Thur and 7.5 mg of Coumadin other days.  Recheck INR on ~ 11/26/13 with scheduled appointments. Lab at 8:30 and infusion at 9am we will see you in infusion

## 2013-11-05 NOTE — Telephone Encounter (Signed)
Attempting to schedule 3 moth follow up with E[facility] I have been unable to reach this patient by phone.  A letter is being sent to the last known home address.

## 2013-11-09 ENCOUNTER — Other Ambulatory Visit: Payer: Self-pay | Admitting: *Deleted

## 2013-11-25 ENCOUNTER — Other Ambulatory Visit: Payer: Self-pay

## 2013-11-26 ENCOUNTER — Other Ambulatory Visit (HOSPITAL_BASED_OUTPATIENT_CLINIC_OR_DEPARTMENT_OTHER): Payer: Commercial Managed Care - HMO

## 2013-11-26 ENCOUNTER — Ambulatory Visit: Payer: Commercial Managed Care - HMO | Admitting: Pharmacist

## 2013-11-26 ENCOUNTER — Other Ambulatory Visit: Payer: Medicare HMO

## 2013-11-26 ENCOUNTER — Ambulatory Visit (HOSPITAL_BASED_OUTPATIENT_CLINIC_OR_DEPARTMENT_OTHER): Payer: Commercial Managed Care - HMO

## 2013-11-26 ENCOUNTER — Ambulatory Visit: Payer: Medicare HMO

## 2013-11-26 VITALS — BP 147/87 | HR 69 | Temp 98.4°F | Wt 286.0 lb

## 2013-11-26 DIAGNOSIS — C50919 Malignant neoplasm of unspecified site of unspecified female breast: Secondary | ICD-10-CM

## 2013-11-26 DIAGNOSIS — C50912 Malignant neoplasm of unspecified site of left female breast: Secondary | ICD-10-CM

## 2013-11-26 DIAGNOSIS — Z7901 Long term (current) use of anticoagulants: Secondary | ICD-10-CM

## 2013-11-26 DIAGNOSIS — Z5112 Encounter for antineoplastic immunotherapy: Secondary | ICD-10-CM

## 2013-11-26 LAB — CBC WITH DIFFERENTIAL/PLATELET
BASO%: 0.2 % (ref 0.0–2.0)
BASOS ABS: 0 10*3/uL (ref 0.0–0.1)
EOS ABS: 0.2 10*3/uL (ref 0.0–0.5)
EOS%: 1.6 % (ref 0.0–7.0)
HCT: 35 % (ref 34.8–46.6)
HGB: 12.1 g/dL (ref 11.6–15.9)
LYMPH%: 29.6 % (ref 14.0–49.7)
MCH: 27.5 pg (ref 25.1–34.0)
MCHC: 34.6 g/dL (ref 31.5–36.0)
MCV: 79.5 fL (ref 79.5–101.0)
MONO#: 0.7 10*3/uL (ref 0.1–0.9)
MONO%: 7.2 % (ref 0.0–14.0)
NEUT%: 61.4 % (ref 38.4–76.8)
NEUTROS ABS: 6.1 10*3/uL (ref 1.5–6.5)
Platelets: 256 10*3/uL (ref 145–400)
RBC: 4.4 10*6/uL (ref 3.70–5.45)
RDW: 15.8 % — AB (ref 11.2–14.5)
WBC: 9.9 10*3/uL (ref 3.9–10.3)
lymph#: 2.9 10*3/uL (ref 0.9–3.3)
nRBC: 0 % (ref 0–0)

## 2013-11-26 LAB — COMPREHENSIVE METABOLIC PANEL (CC13)
ALT: 13 U/L (ref 0–55)
AST: 14 U/L (ref 5–34)
Albumin: 3.6 g/dL (ref 3.5–5.0)
Alkaline Phosphatase: 92 U/L (ref 40–150)
Anion Gap: 11 mEq/L (ref 3–11)
BILIRUBIN TOTAL: 0.45 mg/dL (ref 0.20–1.20)
BUN: 19.6 mg/dL (ref 7.0–26.0)
CO2: 22 mEq/L (ref 22–29)
Calcium: 9.8 mg/dL (ref 8.4–10.4)
Chloride: 110 mEq/L — ABNORMAL HIGH (ref 98–109)
Creatinine: 0.8 mg/dL (ref 0.6–1.1)
GLUCOSE: 95 mg/dL (ref 70–140)
Potassium: 4.2 mEq/L (ref 3.5–5.1)
SODIUM: 144 meq/L (ref 136–145)
TOTAL PROTEIN: 8.6 g/dL — AB (ref 6.4–8.3)

## 2013-11-26 LAB — POCT INR: INR: 2.1

## 2013-11-26 LAB — PROTIME-INR
INR: 2.1 (ref 2.00–3.50)
PROTIME: 25.2 s — AB (ref 10.6–13.4)

## 2013-11-26 MED ORDER — DIPHENHYDRAMINE HCL 25 MG PO CAPS
50.0000 mg | ORAL_CAPSULE | Freq: Once | ORAL | Status: AC
  Administered 2013-11-26: 50 mg via ORAL

## 2013-11-26 MED ORDER — SODIUM CHLORIDE 0.9 % IV SOLN
Freq: Once | INTRAVENOUS | Status: AC
  Administered 2013-11-26: 09:00:00 via INTRAVENOUS

## 2013-11-26 MED ORDER — ACETAMINOPHEN 325 MG PO TABS
ORAL_TABLET | ORAL | Status: AC
  Filled 2013-11-26: qty 2

## 2013-11-26 MED ORDER — LORAZEPAM 2 MG/ML IJ SOLN
INTRAMUSCULAR | Status: AC
  Filled 2013-11-26: qty 1

## 2013-11-26 MED ORDER — DIPHENHYDRAMINE HCL 25 MG PO CAPS
ORAL_CAPSULE | ORAL | Status: AC
  Filled 2013-11-26: qty 2

## 2013-11-26 MED ORDER — LORAZEPAM 2 MG/ML IJ SOLN
1.0000 mg | Freq: Once | INTRAMUSCULAR | Status: AC | PRN
  Administered 2013-11-26: 1 mg via INTRAVENOUS

## 2013-11-26 MED ORDER — SODIUM CHLORIDE 0.9 % IJ SOLN
10.0000 mL | INTRAMUSCULAR | Status: DC | PRN
  Administered 2013-11-26: 10 mL
  Filled 2013-11-26: qty 10

## 2013-11-26 MED ORDER — HEPARIN SOD (PORK) LOCK FLUSH 100 UNIT/ML IV SOLN
500.0000 [IU] | Freq: Once | INTRAVENOUS | Status: AC | PRN
  Administered 2013-11-26: 500 [IU]
  Filled 2013-11-26: qty 5

## 2013-11-26 MED ORDER — ACETAMINOPHEN 325 MG PO TABS
650.0000 mg | ORAL_TABLET | Freq: Once | ORAL | Status: AC
  Administered 2013-11-26: 650 mg via ORAL

## 2013-11-26 MED ORDER — SODIUM CHLORIDE 0.9 % IV SOLN
6.0000 mg/kg | Freq: Once | INTRAVENOUS | Status: AC
  Administered 2013-11-26: 756 mg via INTRAVENOUS
  Filled 2013-11-26: qty 36

## 2013-11-26 NOTE — Patient Instructions (Signed)
[facility] Discharge Instructions for Patients Receiving Chemotherapy  Today you received the following chemotherapy agents Herceptin.  To help prevent nausea and vomiting after your treatment, we encourage you to take your nausea medication as prescribed.   If you develop nausea and vomiting that is not controlled by your nausea medication, call the clinic.   BELOW ARE SYMPTOMS THAT SHOULD BE REPORTED IMMEDIATELY:  *FEVER GREATER THAN 100.5 F  *CHILLS WITH OR WITHOUT FEVER  NAUSEA AND VOMITING THAT IS NOT CONTROLLED WITH YOUR NAUSEA MEDICATION  *UNUSUAL SHORTNESS OF BREATH  *UNUSUAL BRUISING OR BLEEDING  TENDERNESS IN MOUTH AND THROAT WITH OR WITHOUT PRESENCE OF ULCERS  *URINARY PROBLEMS  *BOWEL PROBLEMS  UNUSUAL RASH Items with * indicate a potential emergency and should be followed up as soon as possible.  Feel free to call the clinic you have any questions or concerns. The clinic phone number is [phone].    

## 2013-11-26 NOTE — Progress Notes (Signed)
INR within goal today. CBC is within normal limits today. Pt has been taking 7.5mg  daily except 5mg  on Fridays since 11/05/13 instead of 7.5mg  daily except 5mg  on Mon & Thu as documented. No concerns regarding anticoagulation.  No s/s of clotting. No medication changes. Tylenol use is "about the same." No real changes in diet - less greens as they aren't really in season now. No missed doses. Pt will let us know if she needs any refills on her 2.5mg  tablets. Continue taking 7.5mg  daily except 5mg  on Fridays.  Recheck INR in 3 weeks on 12/17/13 with scheduled appointments; lab at 10:45am, Apt with Amy at 11:15am, infusion at 12:15pm and coumadin clinic at 12:30pm.

## 2013-11-26 NOTE — Patient Instructions (Addendum)
Continue taking 7.5mg  daily except 5mg  on Fridays.  Recheck INR in 3 weeks on 12/17/13 with scheduled appointments; lab at 10:45am, Apt with Amy at 11:15am, infusion at 12:15pm and coumadin clinic at 12:30pm.   We will see you in infusion room.

## 2013-12-01 ENCOUNTER — Ambulatory Visit (HOSPITAL_COMMUNITY)
Admission: RE | Admit: 2013-12-01 | Discharge: 2013-12-01 | Disposition: A | Discharge status: Home / Self Care | Payer: Medicare HMO | Source: Ambulatory Visit | Attending: Cardiology | Admitting: Cardiology

## 2013-12-01 ENCOUNTER — Ambulatory Visit (HOSPITAL_BASED_OUTPATIENT_CLINIC_OR_DEPARTMENT_OTHER)
Admission: RE | Admit: 2013-12-01 | Discharge: 2013-12-01 | Disposition: A | Discharge status: Home / Self Care | Payer: Medicare HMO | Source: Ambulatory Visit | Attending: Cardiology | Admitting: Cardiology

## 2013-12-01 ENCOUNTER — Encounter (HOSPITAL_COMMUNITY): Payer: Self-pay

## 2013-12-01 VITALS — BP 130/80 | HR 60 | Wt 288.0 lb

## 2013-12-01 DIAGNOSIS — C800 Disseminated malignant neoplasm, unspecified: Secondary | ICD-10-CM

## 2013-12-01 DIAGNOSIS — C801 Malignant (primary) neoplasm, unspecified: Secondary | ICD-10-CM | POA: Diagnosis not present

## 2013-12-01 DIAGNOSIS — I509 Heart failure, unspecified: Secondary | ICD-10-CM | POA: Insufficient documentation

## 2013-12-01 DIAGNOSIS — C50919 Malignant neoplasm of unspecified site of unspecified female breast: Secondary | ICD-10-CM | POA: Diagnosis not present

## 2013-12-01 DIAGNOSIS — I059 Rheumatic mitral valve disease, unspecified: Secondary | ICD-10-CM

## 2013-12-01 NOTE — Progress Notes (Signed)
Patient ID: [name], female   DOB: 1950/09/30, 63 y.o.   MRN: [id number] HPI:  [name] is a 63 year old with a history of metastatic HER-2 positive breast carcinoma originally diagnosed September 2004. Started in R breast. Underwent neo-adjuvant chemo. During this time developed L breast CA. Underwent bilateral mastectomy. Lymph nodes +. 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   She received a total of 6 cycles of Taxotere/Carbo/Herceptin, completed in April 2005, after which she began single agent Herceptin, given every 3 weeks for 9 years. Plan to continue on Herceptin indefinitely.   04/23/12 ECHO EF 60-65% Lateral s' 8.9 cm/s  07/30/12 ECHO EF 60-65% Lateral s' 8.3 cm/s  09/11/12 ECHO EF 60-65% Lateral s' 10.3 cm/s  12/22/12 ECHO 55-60% Lateral S' 9.8 RV mildly dilated  07/01/13 ECHO 55-60%, lateral s' 9.79, mild RV dilation, grade II DD 3/15 ECHO 55%, mild MR, lateral s' 9.6, GLS -19.2%  Follow up: Doing well since last visit.  Continues Herceptin.  Mild exertional dyspnea with moderate activity.  Stable weight.  BP has been doing better at home since increasing amlodipine.  She is taking Lasix about every other day.  No chest pain, no lightheadedness with standing.     ROS: All systems negative except as listed in HPI, PMH and Problem List.  Past Medical History  Diagnosis Date  . Breast cancer     mets to liver and lung  . Hypertension   . SVC syndrome   . History of chemotherapy Feb. 2006    taxotere/herceptin/carboplatin  . Radiation 07/31/2006    left upper chest  . Radiation 06/17/2006-06/27/2006    6480 cGy bilat. chest wall  . Neuropathy   . Thrombosis   . Breast cancer metastasized to multiple sites 02/26/2013    Current Outpatient Prescriptions  Medication Sig Dispense Refill  . acetaminophen (TYLENOL) 500 MG tablet Take 500 mg by mouth every 6 (six) hours as needed for pain or fever.      . ALPRAZolam (XANAX) 1 MG  tablet TAKE 1 TABLET BY MOUTH THREE TIMES DAILY AS NEEDED FOR ANXIETY  90 tablet  1  . amLODipine (NORVASC) 10 MG tablet Take 10 mg by mouth every morning.      . diclofenac sodium (VOLTAREN) 1 % GEL Apply 2 g topically daily as needed (for pain). Apply to knees and shoulders  100 g  6  . furosemide (LASIX) 80 MG tablet Take 80 mg by mouth.      . gabapentin (NEURONTIN) 300 MG capsule TAKE 2 CAPSULES BY MOUTH THREE TIMES DAILY  540 capsule  0  . losartan (COZAAR) 100 MG tablet Take 100 mg by mouth every morning.      . metoprolol succinate (TOPROL-XL) 50 MG 24 hr tablet Take 50 mg by mouth daily. Take with or immediately following a meal.      . potassium chloride (MICRO-K) 10 MEQ CR capsule TAKE ONE CAPSULE BY MOUTH TWICE DAILY  180 capsule  1  . prochlorperazine (COMPAZINE) 10 MG tablet Take 1 tablet (10 mg total) by mouth every 6 (six) hours as needed (for nausea).  30 tablet  3  . spironolactone (ALDACTONE) 25 MG tablet Take 0.5 tablets (12.5 mg total) by mouth daily.  30 tablet  3  . traMADol (ULTRAM) 50 MG tablet Take 1 tablet (50 mg total) by mouth every 6 (six) hours as needed.  60 tablet  3  . warfarin (COUMADIN) 2.5 MG tablet  TAKE 1 TABLET BY MOUTH EVERY DAY  30 tablet  2  . warfarin (COUMADIN) 5 MG tablet On Tuesdays and Fridays take two 5 mg tablets to make 10 mg. Take one 2.5 mg tablet along with a 102m to make 7.5 mg all other days  90 tablet  1  . zolpidem (AMBIEN) 10 MG tablet Take 10 mg by mouth at bedtime as needed for sleep.       No current facility-administered medications for this encounter.    Filed Vitals:   12/01/13 0954  BP: 130/80  Pulse: 60  Weight: 288 lb (130.636 kg)  SpO2: 98%    PHYSICAL EXAM: General: Well appearing. No respiratory difficulty Husband present  HEENT: normal  Neck: supple. no JVD difficult to assess d/t body habitus but does not appear elevated; Carotids 2+ bilat; no bruits. No lymphadenopathy or thryomegaly appreciated.  Cor: PMI  nondisplaced. Regular rate & rhythm. No rubs, gallops or murmurs. S/P bilateral mastectomies  Lungs: clear  Abdomen: obese. soft, nontender, nondistended. Good bowel sounds.  Extremities: no cyanosis, clubbing, rash, 1+ edema  Neuro: alert & oriented x 3, cranial nerves grossly intact. moves all 4 extremities w/o difficulty. Affect pleasant.  ASSESSMENT & PLAN:  1) L Breast Cancer s/p bilateral mastectomies:  I reviewed her echo today. EF and lateral s' stable, normal global longitudinal strain. Will followup in 3 months with echo.  Patient is on a stable dose of Lasix for presumed diastolic CHF, will continue.   2) Suspected OSA: Patient has thick neck, mild RV dilation, snores at night.  I have suggested a sleep study but patient wants to hold off for now.  3) HTN: BP under better control today.   DLoralie Champagne4/02/2014

## 2013-12-01 NOTE — Progress Notes (Signed)
  Echocardiogram 2D Echocardiogram has been performed.  Diamond Nickel 12/01/2013, 9:49 AM

## 2013-12-04 ENCOUNTER — Telehealth: Payer: Self-pay | Admitting: Oncology

## 2013-12-17 ENCOUNTER — Ambulatory Visit (HOSPITAL_BASED_OUTPATIENT_CLINIC_OR_DEPARTMENT_OTHER): Payer: Commercial Managed Care - HMO | Admitting: Pharmacist

## 2013-12-17 ENCOUNTER — Ambulatory Visit: Payer: Commercial Managed Care - HMO

## 2013-12-17 ENCOUNTER — Other Ambulatory Visit: Payer: Medicare HMO

## 2013-12-17 ENCOUNTER — Ambulatory Visit (HOSPITAL_BASED_OUTPATIENT_CLINIC_OR_DEPARTMENT_OTHER): Payer: Commercial Managed Care - HMO | Admitting: Oncology

## 2013-12-17 ENCOUNTER — Ambulatory Visit (HOSPITAL_BASED_OUTPATIENT_CLINIC_OR_DEPARTMENT_OTHER): Payer: Commercial Managed Care - HMO

## 2013-12-17 ENCOUNTER — Ambulatory Visit: Payer: Medicare HMO

## 2013-12-17 ENCOUNTER — Telehealth: Payer: Self-pay | Admitting: Oncology

## 2013-12-17 ENCOUNTER — Telehealth: Payer: Self-pay | Admitting: *Deleted

## 2013-12-17 ENCOUNTER — Ambulatory Visit (HOSPITAL_BASED_OUTPATIENT_CLINIC_OR_DEPARTMENT_OTHER): Payer: Medicare HMO

## 2013-12-17 ENCOUNTER — Ambulatory Visit: Payer: Medicare HMO | Admitting: Physician Assistant

## 2013-12-17 ENCOUNTER — Other Ambulatory Visit: Payer: Self-pay | Admitting: *Deleted

## 2013-12-17 VITALS — BP 139/82 | HR 81 | Temp 98.6°F | Resp 16

## 2013-12-17 VITALS — BP 139/82 | HR 81 | Temp 98.6°F | Resp 16 | Ht 63.0 in | Wt 288.7 lb

## 2013-12-17 DIAGNOSIS — C787 Secondary malignant neoplasm of liver and intrahepatic bile duct: Secondary | ICD-10-CM

## 2013-12-17 DIAGNOSIS — C50919 Malignant neoplasm of unspecified site of unspecified female breast: Secondary | ICD-10-CM | POA: Diagnosis not present

## 2013-12-17 DIAGNOSIS — C7951 Secondary malignant neoplasm of bone: Secondary | ICD-10-CM | POA: Diagnosis not present

## 2013-12-17 DIAGNOSIS — C7952 Secondary malignant neoplasm of bone marrow: Secondary | ICD-10-CM

## 2013-12-17 DIAGNOSIS — Z95828 Presence of other vascular implants and grafts: Secondary | ICD-10-CM

## 2013-12-17 DIAGNOSIS — Z5112 Encounter for antineoplastic immunotherapy: Secondary | ICD-10-CM

## 2013-12-17 DIAGNOSIS — C50912 Malignant neoplasm of unspecified site of left female breast: Secondary | ICD-10-CM

## 2013-12-17 DIAGNOSIS — C7800 Secondary malignant neoplasm of unspecified lung: Secondary | ICD-10-CM

## 2013-12-17 DIAGNOSIS — I82409 Acute embolism and thrombosis of unspecified deep veins of unspecified lower extremity: Secondary | ICD-10-CM

## 2013-12-17 DIAGNOSIS — I871 Compression of vein: Secondary | ICD-10-CM

## 2013-12-17 DIAGNOSIS — F411 Generalized anxiety disorder: Secondary | ICD-10-CM

## 2013-12-17 DIAGNOSIS — G8929 Other chronic pain: Secondary | ICD-10-CM

## 2013-12-17 LAB — COMPREHENSIVE METABOLIC PANEL (CC13)
ALT: 11 U/L (ref 0–55)
ANION GAP: 13 meq/L — AB (ref 3–11)
AST: 14 U/L (ref 5–34)
Albumin: 3.5 g/dL (ref 3.5–5.0)
Alkaline Phosphatase: 115 U/L (ref 40–150)
BUN: 17.4 mg/dL (ref 7.0–26.0)
CALCIUM: 9.9 mg/dL (ref 8.4–10.4)
CO2: 24 meq/L (ref 22–29)
Chloride: 106 mEq/L (ref 98–109)
Creatinine: 0.8 mg/dL (ref 0.6–1.1)
Glucose: 108 mg/dl (ref 70–140)
Potassium: 4.3 mEq/L (ref 3.5–5.1)
SODIUM: 143 meq/L (ref 136–145)
TOTAL PROTEIN: 8.6 g/dL — AB (ref 6.4–8.3)
Total Bilirubin: 0.54 mg/dL (ref 0.20–1.20)

## 2013-12-17 LAB — CBC WITH DIFFERENTIAL/PLATELET
BASO%: 0.5 % (ref 0.0–2.0)
Basophils Absolute: 0 10*3/uL (ref 0.0–0.1)
EOS%: 1.4 % (ref 0.0–7.0)
Eosinophils Absolute: 0.2 10*3/uL (ref 0.0–0.5)
HCT: 35.2 % (ref 34.8–46.6)
HGB: 11.9 g/dL (ref 11.6–15.9)
LYMPH%: 28.1 % (ref 14.0–49.7)
MCH: 27.6 pg (ref 25.1–34.0)
MCHC: 33.7 g/dL (ref 31.5–36.0)
MCV: 81.9 fL (ref 79.5–101.0)
MONO#: 0.7 10*3/uL (ref 0.1–0.9)
MONO%: 6.5 % (ref 0.0–14.0)
NEUT%: 63.5 % (ref 38.4–76.8)
NEUTROS ABS: 6.6 10*3/uL — AB (ref 1.5–6.5)
Platelets: 265 10*3/uL (ref 145–400)
RBC: 4.3 10*6/uL (ref 3.70–5.45)
RDW: 15.8 % — ABNORMAL HIGH (ref 11.2–14.5)
WBC: 10.5 10*3/uL — ABNORMAL HIGH (ref 3.9–10.3)
lymph#: 2.9 10*3/uL (ref 0.9–3.3)

## 2013-12-17 LAB — PROTIME-INR
INR: 1.9 — ABNORMAL LOW (ref 2.00–3.50)
Protime: 22.8 Seconds — ABNORMAL HIGH (ref 10.6–13.4)

## 2013-12-17 LAB — POCT INR: INR: 1.9

## 2013-12-17 MED ORDER — LORAZEPAM 2 MG/ML IJ SOLN
1.0000 mg | Freq: Once | INTRAMUSCULAR | Status: AC | PRN
  Administered 2013-12-17: 1 mg via INTRAVENOUS

## 2013-12-17 MED ORDER — DIPHENHYDRAMINE HCL 25 MG PO CAPS
50.0000 mg | ORAL_CAPSULE | Freq: Once | ORAL | Status: AC
  Administered 2013-12-17: 50 mg via ORAL

## 2013-12-17 MED ORDER — HEPARIN SOD (PORK) LOCK FLUSH 100 UNIT/ML IV SOLN
500.0000 [IU] | Freq: Once | INTRAVENOUS | Status: AC | PRN
  Administered 2013-12-17: 500 [IU]
  Filled 2013-12-17: qty 5

## 2013-12-17 MED ORDER — ACETAMINOPHEN 325 MG PO TABS
650.0000 mg | ORAL_TABLET | Freq: Once | ORAL | Status: AC
  Administered 2013-12-17: 650 mg via ORAL

## 2013-12-17 MED ORDER — TRASTUZUMAB CHEMO INJECTION 440 MG
6.0000 mg/kg | Freq: Once | INTRAVENOUS | Status: AC
  Administered 2013-12-17: 756 mg via INTRAVENOUS
  Filled 2013-12-17: qty 36

## 2013-12-17 MED ORDER — DIPHENHYDRAMINE HCL 25 MG PO CAPS
ORAL_CAPSULE | ORAL | Status: AC
  Filled 2013-12-17: qty 1

## 2013-12-17 MED ORDER — LORAZEPAM 2 MG/ML IJ SOLN
INTRAMUSCULAR | Status: AC
  Filled 2013-12-17: qty 1

## 2013-12-17 MED ORDER — SODIUM CHLORIDE 0.9 % IJ SOLN
10.0000 mL | INTRAMUSCULAR | Status: DC | PRN
  Administered 2013-12-17: 10 mL
  Filled 2013-12-17: qty 10

## 2013-12-17 MED ORDER — ACETAMINOPHEN 325 MG PO TABS
ORAL_TABLET | ORAL | Status: AC
  Filled 2013-12-17: qty 2

## 2013-12-17 MED ORDER — SODIUM CHLORIDE 0.9 % IV SOLN
Freq: Once | INTRAVENOUS | Status: AC
  Administered 2013-12-17: 13:00:00 via INTRAVENOUS

## 2013-12-17 MED ORDER — SODIUM CHLORIDE 0.9 % IJ SOLN
10.0000 mL | INTRAMUSCULAR | Status: DC | PRN
  Administered 2013-12-17: 10 mL via INTRAVENOUS
  Filled 2013-12-17: qty 10

## 2013-12-17 NOTE — Progress Notes (Signed)
[facility]  [phone] [phone]) [phone]  OFFICE PROGRESS NOTE  ID: [name]   DOB: 05-03-1951  MR#: [id number]  QQV#:[id number]  PCP: Ernestene Kiel, MD GYN:  SU:  OTHER MD: Pierre Bali, Johnnette Gourd, Gery Pray   HISTORY OF PRESENT ILLNESS:   From Dr. Julien Girt note 06/04/2003: "This Davis has been in good health all of her life.  She noted a swelling and discomfort in her right breast in June of this year.  She was seen in the Emergency Room in [place] and was treated for mastitis.  She was treated for a number of months with mastitis and the swelling did not get better. She was given hydrocodone and Cipro.  Finally, the swelling did get better and ultimately the nipple became retracted and she noticed some dimpling in her skin. She had a mammogram in July of this year in [place] with subsequent mammogram on May 27, 2003, by Dr. Isaiah Blakes.  Mammogram done on September 30 showed marked increased density in the left breast.  Biopsy was performed the same day.  It was noted at the 12 o'clock position, deep in the breast was a focal hypoechoic mass, at least 3.5 cm in diameter.  Biopsy did in fact show invasive in situ mammary carcinoma. This was felt to be both at least intermediate, high grade.  No definite lymphovascular invasion was identified. ER and PR, Her2 testing is pending.  Karlea continues to have pain in her breast.  She continues to take hydrocodone a number of times a day.  She has been seen by Dr. Rosana Hoes, who felt that neoadjuvant chemotherapy would be required.    Initial staging studies showed evidence of liver and lung mets.   Patient also has evidence of bone lesions. Patient started neoadjuvant chemotherapy, Taxotere/Carbo/Herceptin in October 2004.   Patient had a CT scan in December 2004 which demonstrated extensive clot in the SVC innominate vein, bilateral jugular vein and  She was started on anticoagulation therapy. She  received a total of 6 cycles of Taxotere/Carbo/Herceptin, completed in April 2005."  Her subsequent history is as detailed below.  INTERVAL HISTORY: [name] returns today accompanied by her husband, Abe People, for follow up of her metastatic breast cancer. Since her last visit here she went off her narcotics, she says with but no difficulty". She didn't notice any changes. She had not been constipated before, or had any nausea or confusion from those medications . REVIEW OF SYSTEMS: [name] has chronic pain problems which relate chiefly to her neck and shoulders but also to her knees and hips. The pain is not more intense or persistent that before. For a long time she had been on narcotics, but she successfully went off the narcotics last year and has been on her current pain medicine since. The problem is she really feels that tramadol is not quite doing it. Aside from the pain issue, which is very stable as noted, just some shortness of breath when walking up stairs and she does not think she can walk 200 feet. He she goes to [employer] she has to sit down after walking a couple of miles. She continues to feel anxious and when she's not anxious she is irritable. She is excited though because her son is getting married this year (to a nurse!). Aside from this a detailed review systems today was stable  PAST MEDICAL HISTORY: Past Medical History  Diagnosis Date  . Breast cancer     mets to liver and lung  .  Hypertension   . SVC syndrome   . History of chemotherapy Feb. 2006    taxotere/herceptin/carboplatin  . Radiation 07/31/2006    left upper chest  . Radiation 06/17/2006-06/27/2006    6480 cGy bilat. chest wall  . Neuropathy   . Thrombosis   . Breast cancer metastasized to multiple sites 02/26/2013    PAST SURGICAL HISTORY: Past Surgical History  Procedure Laterality Date  . Tubal ligation  1986  . Cholecystectomy  1989  . Mastectomy Bilateral   . Ankle surgery    . Peripherally inserted  central catheter insertion    . Back surgery      FAMILY HISTORY Family History  Problem Relation Age of Onset  . Heart failure Father   . Cancer Father     Prostate cancer  . Heart failure Brother   . Cancer Brother     Prostate cancer  . Diabetes Maternal Aunt    She had three brothers, one died of gunshot wound, one of complications of diabetes mellitus and one of myocardial infarction.  She has no sisters.  Mother died of complications of brain metastasis in 51.  Father has had a myocardial infarction in 1999.  No history of breast or ovarian cancer in the family.     GYNECOLOGIC HISTORY:   Menarche at age 38.  Gravida 3, para 3.  First live birth at age 78.  No history of breast feeding. No history of hormonal replacement therapy.   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 Her husband used to work as a Art gallery manager, but is now retired. She has three children, Monette who lives in [place] and works as a Hydrographic surveyor, Financial risk analyst who lives in [place] and works as a Administrator, and [name] who lives in [place] and also works as a Hydrographic surveyor. The patient has 12 grandchildren and 3 great-grandchildren. She attends a [employer].  ADVANCED DIRECTIVES:  Not in place  HEALTH MAINTENANCE: (Updated 06/12/2013) History  Substance Use Topics  . Smoking status: Never Smoker   . Smokeless tobacco: Never Used  . Alcohol Use: Yes     Comment: occasional     Colonoscopy: Never  PAP:  1987  Bone density:  Never  Lipid panel:  Not on file    Allergies  Allergen Reactions  . Adhesive [Tape] Other (See Comments)    Tears skin   . Penicillins Hives    Current Outpatient Prescriptions  Medication Sig Dispense Refill  . acetaminophen (TYLENOL) 500 MG tablet Take 500 mg by mouth every 6 (six) hours as needed for pain or fever.      . ALPRAZolam (XANAX) 1 MG tablet TAKE 1 TABLET BY MOUTH THREE TIMES  DAILY AS NEEDED FOR ANXIETY  90 tablet  1  . amLODipine (NORVASC) 10 MG tablet Take 10 mg by mouth every morning.      . diclofenac sodium (VOLTAREN) 1 % GEL Apply 2 g topically daily as needed (for pain). Apply to knees and shoulders  100 g  6  . furosemide (LASIX) 80 MG tablet Take 80 mg by mouth.      . gabapentin (NEURONTIN) 300 MG capsule TAKE 2 CAPSULES BY MOUTH THREE TIMES DAILY  540 capsule  0  . losartan (COZAAR) 100 MG tablet Take 100 mg by mouth every morning.      . metoprolol succinate (TOPROL-XL) 50 MG 24 hr tablet Take  50 mg by mouth daily. Take with or immediately following a meal.      . potassium chloride (MICRO-K) 10 MEQ CR capsule TAKE ONE CAPSULE BY MOUTH TWICE DAILY  180 capsule  1  . prochlorperazine (COMPAZINE) 10 MG tablet Take 1 tablet (10 mg total) by mouth every 6 (six) hours as needed (for nausea).  30 tablet  3  . spironolactone (ALDACTONE) 25 MG tablet Take 0.5 tablets (12.5 mg total) by mouth daily.  30 tablet  3  . traMADol (ULTRAM) 50 MG tablet Take 1 tablet (50 mg total) by mouth every 6 (six) hours as needed.  60 tablet  3  . warfarin (COUMADIN) 2.5 MG tablet TAKE 1 TABLET BY MOUTH EVERY DAY  30 tablet  2  . warfarin (COUMADIN) 5 MG tablet On Tuesdays and Fridays take two 5 mg tablets to make 10 mg. Take one 2.5 mg tablet along with a $Remo'5mg'VvHRz$  to make 7.5 mg all other days  90 tablet  1  . zolpidem (AMBIEN) 10 MG tablet Take 10 mg by mouth at bedtime as needed for sleep.       No current facility-administered medications for this visit.   Facility-Administered Medications Ordered in Other Visits  Medication Dose Route Frequency Provider Last Rate Last Dose  . sodium chloride 0.9 % injection 10 mL  10 mL Intravenous PRN Chauncey Cruel, MD   10 mL at 12/17/13 1103    OBJECTIVE:  Middle aged African American Davis who appears stated age 37 Vitals:   12/17/13 1127  BP: 139/82  Pulse: 81  Temp: 98.6 F (37 C)  Resp: 16     Body mass index is 51.15  kg/(m^2).    ECOG FS: 1 Filed Weights   12/17/13 1127  Weight: 288 lb 11.2 oz (130.953 kg)   Filed Weights   12/17/13 1127  Weight: 288 lb 11.2 oz (130.953 kg)   Sclerae unicteric, pupils equal and reactive Oropharynx clear and moist No cervical or supraclavicular adenopathy Lungs no rales or rhonchi Heart regular rate and rhythm Abd soft, obese, nontender, positive bowel sounds MSK no focal spinal tenderness, no upper extremity lymphedema, poor range of motion in both upper extremities Neuro: nonfocal, well oriented, appropriate affect Breasts: Status post bilateral mastectomies. The incisions have healed and a very irregular pattern, but there is no evidence of local recurrence. Both axillae are benign.    LAB RESULTS: Lab Results  Component Value Date   WBC 10.5* 12/17/2013   NEUTROABS 6.6* 12/17/2013   HGB 11.9 12/17/2013   HCT 35.2 12/17/2013   MCV 81.9 12/17/2013   PLT 265 12/17/2013      Chemistry      Component Value Date/Time   NA 144 11/26/2013 0816   NA 138 03/21/2013 0658   K 4.2 11/26/2013 0816   K 3.6 03/21/2013 0658   CL 103 03/21/2013 0658   CL 104 01/30/2013 0850   CO2 22 11/26/2013 0816   CO2 27 03/21/2013 0658   BUN 19.6 11/26/2013 0816   BUN 11 03/21/2013 0658   CREATININE 0.8 11/26/2013 0816   CREATININE 0.82 03/21/2013 0658      Component Value Date/Time   CALCIUM 9.8 11/26/2013 0816   CALCIUM 9.5 03/21/2013 0658   ALKPHOS 92 11/26/2013 0816   ALKPHOS 87 04/11/2012 0837   AST 14 11/26/2013 0816   AST 19 04/11/2012 0837   ALT 13 11/26/2013 0816   ALT 14 04/11/2012 0837   BILITOT 0.45 11/26/2013 8657  BILITOT 0.8 04/11/2012 0837      STUDIES Transthoracic Echocardiography  Patient: Yolanda, Davis MR #: [id number] Study Date: 12/01/2013 Gender: F Age: 14 Height: 158.8cm Weight: 130kg BSA: 2.34m2 Pt. Status: Room:  SONOGRAPHER GDiamond NickelREFERRING Prochnau, CAnnice Pih Dalton OCathleen FearsPERFORMING [facility],  Outpatient cc:  ------------------------------------------------------------ LV EF: 55%  ------------------------------------------------------------ Indications: Previous study 07/01/2013.  ------------------------------------------------------------ History: PMH: Breast cancer metastasized to multiple sites. Right shoulder pain. Neck pain. Malignant neoplasm breast - 174.9. Congestive heart failure. Risk factors: Hypertension.  ------------------------------------------------------------ Study Conclusions  - Left ventricle: The cavity size was normal. Wall thickness was normal. Systolic function was normal. The estimated ejection fraction was 55%. Wall motion was normal; there were no regional wall motion abnormalities. - Aortic valve: Trivial regurgitation. - Mitral valve: Mild regurgitation. - Atrial septum: No defect or patent foramen ovale was identified. - Impressions: Normal global longitudinal strain   ASSESSMENT: 63y.o.  [name]  (1)  with a history of inflammatory right breast cancer metastatic at presentation September 2004 with involvement of liver and bone, HER-2 positive, estrogen and progesterone receptor negative  (2) treated with carboplatin, docetaxel and Herceptin x6 completed April 2005  (3) trastuzumab continued indefinitely; has also received lapatinib and capecitabine for variable intervals in 2007-2008. Most recent echo 12/01/2013 showed an ejection fraction of 55%.  (4) status post bilateral mastectomies with bilateral axillary lymph node dissection 12/07/2004, showing  (a) on the right, a mypT1c ypN1 invasive ductal carcinoma, grade 3, estrogen and progesterone receptor negative, HER-2 positive, with an MIB-1 of 31%  (b) on the left, ypT2 ypN1 invasive ductal carcinoma, grade 2, estrogen and progesterone receptor negative, HER-2 positive, with an MIB-1 of 35%.  (5)  Status post radiation June through July of 2006, to the  right chest wall, left chest wall, bilateral supraclavicular fossae, and bilateral axillary boosts; with additional radiation to the right and left chest walls and the central chest wall completed November of 2007  (6) status post ixempra x9 completed August of 2009.  (7) history of superior vena caval syndrome, on life long anticoagulation   (8)  History of chemotherapy-induced neuropathy.   (9)  chronic pain, with negative PET scan 08/24/2013 (no evidence of active cancer). On Neurontin and Tramadol   PLAN: [name] remains anxious, and takes Xanax 2 or 3 times daily so she does not get irritable, as she puts it. She is tolerating the tramadol well, but it doesn't quite do it at the current dose aware upping the dose to 100 mg 4 times a day. She understands that the pain she is having as far as I can tell is going to be due more to arthritis and fibromyalgia then to her cancer. That pretty much takes her up to the limit of what I can do short of narcotics, so we are referring her to the pain clinic in [place] She will let me know if they are helpful.  From a breast cancer point of view she is doing terrific. She is tolerating the trastuzumab without any side effects that she is aware of and the plan is to continue that indefinitely or until there is disease progression. She will have a repeat PET scan in October of this year. She will continue to see uKoreaon an every three-month basis.  Marvene has a good understanding of the overall plan. She agrees with it. She knows to call if treatment in her case this control. She will call for  any problems that may develop before next visit here.  Chauncey Cruel, MD  12/17/2013  11:33 AM

## 2013-12-17 NOTE — Patient Instructions (Signed)
INR right at goal No changes Continue taking 7.5mg  daily except 5mg  on Fridays.  Recheck INR in 3 weeks on 01/07/14 with scheduled appointments; lab at 9:15am, infusion at 9:45pm and coumadin clinic at 10am.  We will see you in infusion room.

## 2013-12-17 NOTE — Telephone Encounter (Signed)
, °

## 2013-12-17 NOTE — Progress Notes (Signed)
INR right at goal Pt seen in infusion area for herceptin treatment Pt is doing well with no complaints No missed or extra doses No unusual bleeding or bruising No medication or diet changes Will make no changes Plan: Continue taking 7.5mg  daily except 5mg  on Fridays.  Recheck INR in 3 weeks on 01/07/14 with scheduled appointments; lab at 9:15am, infusion at 9:45pm and coumadin clinic at 10am.  We will see you in infusion room.

## 2013-12-17 NOTE — Telephone Encounter (Signed)
Faxed referral to: [facility], Dr Marion Downer [address] [place], [place] [address] Phone: [phone] [phone]

## 2013-12-17 NOTE — Patient Instructions (Signed)

## 2013-12-17 NOTE — Addendum Note (Signed)
Addended by: Lannette Donath E on: 12/17/2013 12:27 PM   Modules accepted: Orders, Medications

## 2013-12-17 NOTE — Telephone Encounter (Signed)
Per staff message and POF I have scheduled appts.  JMW  

## 2013-12-17 NOTE — Patient Instructions (Signed)

## 2013-12-28 ENCOUNTER — Other Ambulatory Visit: Payer: Self-pay | Admitting: *Deleted

## 2013-12-28 MED ORDER — ALPRAZOLAM 1 MG PO TABS: 1.0000 mg | ORAL_TABLET | Freq: Three times a day (TID) | ORAL | Status: DC | PRN

## 2013-12-28 MED ORDER — TRAMADOL HCL 50 MG PO TABS: 100.0000 mg | ORAL_TABLET | Freq: Four times a day (QID) | ORAL | Status: DC | PRN

## 2014-01-06 ENCOUNTER — Other Ambulatory Visit: Payer: Self-pay | Admitting: Pharmacist

## 2014-01-06 DIAGNOSIS — I82409 Acute embolism and thrombosis of unspecified deep veins of unspecified lower extremity: Secondary | ICD-10-CM

## 2014-01-07 ENCOUNTER — Other Ambulatory Visit (HOSPITAL_BASED_OUTPATIENT_CLINIC_OR_DEPARTMENT_OTHER): Payer: Commercial Managed Care - HMO

## 2014-01-07 ENCOUNTER — Ambulatory Visit (HOSPITAL_BASED_OUTPATIENT_CLINIC_OR_DEPARTMENT_OTHER): Payer: Medicare HMO | Admitting: Pharmacist

## 2014-01-07 ENCOUNTER — Ambulatory Visit (HOSPITAL_BASED_OUTPATIENT_CLINIC_OR_DEPARTMENT_OTHER): Payer: Commercial Managed Care - HMO

## 2014-01-07 ENCOUNTER — Other Ambulatory Visit: Payer: Commercial Managed Care - HMO

## 2014-01-07 ENCOUNTER — Ambulatory Visit: Payer: Commercial Managed Care - HMO

## 2014-01-07 VITALS — BP 119/70 | HR 63 | Temp 98.5°F | Resp 18

## 2014-01-07 DIAGNOSIS — C50919 Malignant neoplasm of unspecified site of unspecified female breast: Secondary | ICD-10-CM

## 2014-01-07 DIAGNOSIS — C50912 Malignant neoplasm of unspecified site of left female breast: Secondary | ICD-10-CM

## 2014-01-07 DIAGNOSIS — Z7901 Long term (current) use of anticoagulants: Secondary | ICD-10-CM

## 2014-01-07 DIAGNOSIS — Z5112 Encounter for antineoplastic immunotherapy: Secondary | ICD-10-CM

## 2014-01-07 DIAGNOSIS — C787 Secondary malignant neoplasm of liver and intrahepatic bile duct: Secondary | ICD-10-CM

## 2014-01-07 DIAGNOSIS — C7952 Secondary malignant neoplasm of bone marrow: Secondary | ICD-10-CM

## 2014-01-07 DIAGNOSIS — C7951 Secondary malignant neoplasm of bone: Secondary | ICD-10-CM

## 2014-01-07 DIAGNOSIS — I82409 Acute embolism and thrombosis of unspecified deep veins of unspecified lower extremity: Secondary | ICD-10-CM

## 2014-01-07 LAB — CBC WITH DIFFERENTIAL/PLATELET
BASO%: 0.3 % (ref 0.0–2.0)
Basophils Absolute: 0 10*3/uL (ref 0.0–0.1)
EOS%: 1 % (ref 0.0–7.0)
Eosinophils Absolute: 0.1 10*3/uL (ref 0.0–0.5)
HCT: 35.2 % (ref 34.8–46.6)
HGB: 12.4 g/dL (ref 11.6–15.9)
LYMPH%: 25.6 % (ref 14.0–49.7)
MCH: 27.8 pg (ref 25.1–34.0)
MCHC: 35.2 g/dL (ref 31.5–36.0)
MCV: 78.9 fL — ABNORMAL LOW (ref 79.5–101.0)
MONO#: 0.5 10*3/uL (ref 0.1–0.9)
MONO%: 5.6 % (ref 0.0–14.0)
NEUT#: 6.2 10*3/uL (ref 1.5–6.5)
NEUT%: 67.5 % (ref 38.4–76.8)
PLATELETS: 258 10*3/uL (ref 145–400)
RBC: 4.46 10*6/uL (ref 3.70–5.45)
RDW: 15.2 % — ABNORMAL HIGH (ref 11.2–14.5)
WBC: 9.2 10*3/uL (ref 3.9–10.3)
lymph#: 2.4 10*3/uL (ref 0.9–3.3)
nRBC: 0 % (ref 0–0)

## 2014-01-07 LAB — COMPREHENSIVE METABOLIC PANEL (CC13)
ALBUMIN: 3.5 g/dL (ref 3.5–5.0)
ALT: 11 U/L (ref 0–55)
ANION GAP: 11 meq/L (ref 3–11)
AST: 11 U/L (ref 5–34)
Alkaline Phosphatase: 95 U/L (ref 40–150)
BUN: 17.9 mg/dL (ref 7.0–26.0)
CHLORIDE: 108 meq/L (ref 98–109)
CO2: 23 meq/L (ref 22–29)
CREATININE: 0.9 mg/dL (ref 0.6–1.1)
Calcium: 10.1 mg/dL (ref 8.4–10.4)
Glucose: 108 mg/dl (ref 70–140)
POTASSIUM: 4.1 meq/L (ref 3.5–5.1)
Sodium: 142 mEq/L (ref 136–145)
Total Bilirubin: 0.54 mg/dL (ref 0.20–1.20)
Total Protein: 8.4 g/dL — ABNORMAL HIGH (ref 6.4–8.3)

## 2014-01-07 LAB — PROTIME-INR
INR: 1.9 — ABNORMAL LOW (ref 2.00–3.50)
Protime: 22.8 Seconds — ABNORMAL HIGH (ref 10.6–13.4)

## 2014-01-07 LAB — POCT INR: INR: 1.9

## 2014-01-07 MED ORDER — DIPHENHYDRAMINE HCL 25 MG PO CAPS
50.0000 mg | ORAL_CAPSULE | Freq: Once | ORAL | Status: AC
  Administered 2014-01-07: 50 mg via ORAL

## 2014-01-07 MED ORDER — TRASTUZUMAB CHEMO INJECTION 440 MG
6.0000 mg/kg | Freq: Once | INTRAVENOUS | Status: AC
  Administered 2014-01-07: 756 mg via INTRAVENOUS
  Filled 2014-01-07: qty 36

## 2014-01-07 MED ORDER — LORAZEPAM 2 MG/ML IJ SOLN
1.0000 mg | Freq: Once | INTRAMUSCULAR | Status: AC | PRN
  Administered 2014-01-07: 1 mg via INTRAVENOUS

## 2014-01-07 MED ORDER — ACETAMINOPHEN 325 MG PO TABS
ORAL_TABLET | ORAL | Status: AC
  Filled 2014-01-07: qty 2

## 2014-01-07 MED ORDER — SODIUM CHLORIDE 0.9 % IJ SOLN
10.0000 mL | INTRAMUSCULAR | Status: DC | PRN
  Administered 2014-01-07: 10 mL
  Filled 2014-01-07: qty 10

## 2014-01-07 MED ORDER — SODIUM CHLORIDE 0.9 % IV SOLN
Freq: Once | INTRAVENOUS | Status: AC
  Administered 2014-01-07: 10:00:00 via INTRAVENOUS

## 2014-01-07 MED ORDER — LORAZEPAM 2 MG/ML IJ SOLN
INTRAMUSCULAR | Status: AC
  Filled 2014-01-07: qty 1

## 2014-01-07 MED ORDER — HEPARIN SOD (PORK) LOCK FLUSH 100 UNIT/ML IV SOLN
500.0000 [IU] | Freq: Once | INTRAVENOUS | Status: AC | PRN
  Administered 2014-01-07: 500 [IU]
  Filled 2014-01-07: qty 5

## 2014-01-07 MED ORDER — ACETAMINOPHEN 325 MG PO TABS
650.0000 mg | ORAL_TABLET | Freq: Once | ORAL | Status: AC
  Administered 2014-01-07: 650 mg via ORAL

## 2014-01-07 MED ORDER — DIPHENHYDRAMINE HCL 25 MG PO CAPS
ORAL_CAPSULE | ORAL | Status: AC
  Filled 2014-01-07: qty 2

## 2014-01-07 NOTE — Progress Notes (Signed)
INR = 1.9       Goal 2-3 INR just below goal range. Patient seen in the infusion area. Patient states that she has been eating much more greens than usual this week. She always eats greens, but she fixed greens from her yard this week which she ran out of yesterday. She will now be eating somewhat less greens. She has had no complications of anticoagulation. She has had no medication changes. She will continue taking 7.5mg  daily except 5mg  on Fridays. Recheck INR in 3 weeks on 01/28/14 with scheduled appointments; lab at 8:30am,  infusion at 9am.  We will see her in the infusion area at that time.  Theone Murdoch, PharmD

## 2014-01-07 NOTE — Patient Instructions (Addendum)
[facility] Discharge Instructions for Patients Receiving Chemotherapy  Today you received the following chemotherapy agents Herceptin.  To help prevent nausea and vomiting after your treatment, we encourage you to take your nausea medication as directed.   If you develop nausea and vomiting that is not controlled by your nausea medication, call the clinic.   BELOW ARE SYMPTOMS THAT SHOULD BE REPORTED IMMEDIATELY:  *FEVER GREATER THAN 100.5 F  *CHILLS WITH OR WITHOUT FEVER  NAUSEA AND VOMITING THAT IS NOT CONTROLLED WITH YOUR NAUSEA MEDICATION  *UNUSUAL SHORTNESS OF BREATH  *UNUSUAL BRUISING OR BLEEDING  TENDERNESS IN MOUTH AND THROAT WITH OR WITHOUT PRESENCE OF ULCERS  *URINARY PROBLEMS  *BOWEL PROBLEMS  UNUSUAL RASH Items with * indicate a potential emergency and should be followed up as soon as possible.  Feel free to call the clinic you have any questions or concerns. The clinic phone number is [phone].  

## 2014-01-28 ENCOUNTER — Other Ambulatory Visit (HOSPITAL_BASED_OUTPATIENT_CLINIC_OR_DEPARTMENT_OTHER): Payer: Commercial Managed Care - HMO

## 2014-01-28 ENCOUNTER — Ambulatory Visit (HOSPITAL_BASED_OUTPATIENT_CLINIC_OR_DEPARTMENT_OTHER): Payer: Commercial Managed Care - HMO | Admitting: Pharmacist

## 2014-01-28 ENCOUNTER — Ambulatory Visit (HOSPITAL_BASED_OUTPATIENT_CLINIC_OR_DEPARTMENT_OTHER): Payer: Commercial Managed Care - HMO

## 2014-01-28 VITALS — BP 129/90 | HR 60 | Temp 98.1°F | Resp 18

## 2014-01-28 DIAGNOSIS — Z7901 Long term (current) use of anticoagulants: Secondary | ICD-10-CM

## 2014-01-28 DIAGNOSIS — Z5112 Encounter for antineoplastic immunotherapy: Secondary | ICD-10-CM

## 2014-01-28 DIAGNOSIS — C50919 Malignant neoplasm of unspecified site of unspecified female breast: Secondary | ICD-10-CM

## 2014-01-28 DIAGNOSIS — C7951 Secondary malignant neoplasm of bone: Secondary | ICD-10-CM

## 2014-01-28 DIAGNOSIS — C787 Secondary malignant neoplasm of liver and intrahepatic bile duct: Secondary | ICD-10-CM | POA: Diagnosis not present

## 2014-01-28 DIAGNOSIS — I871 Compression of vein: Secondary | ICD-10-CM

## 2014-01-28 DIAGNOSIS — C50912 Malignant neoplasm of unspecified site of left female breast: Secondary | ICD-10-CM

## 2014-01-28 DIAGNOSIS — C7952 Secondary malignant neoplasm of bone marrow: Secondary | ICD-10-CM

## 2014-01-28 DIAGNOSIS — C7800 Secondary malignant neoplasm of unspecified lung: Secondary | ICD-10-CM

## 2014-01-28 LAB — CBC WITH DIFFERENTIAL/PLATELET
BASO%: 0.9 % (ref 0.0–2.0)
BASOS ABS: 0.1 10*3/uL (ref 0.0–0.1)
EOS%: 2.6 % (ref 0.0–7.0)
Eosinophils Absolute: 0.2 10*3/uL (ref 0.0–0.5)
HCT: 36.4 % (ref 34.8–46.6)
HEMOGLOBIN: 12.2 g/dL (ref 11.6–15.9)
LYMPH%: 33.4 % (ref 14.0–49.7)
MCH: 27.5 pg (ref 25.1–34.0)
MCHC: 33.5 g/dL (ref 31.5–36.0)
MCV: 82 fL (ref 79.5–101.0)
MONO#: 0.6 10*3/uL (ref 0.1–0.9)
MONO%: 7.6 % (ref 0.0–14.0)
NEUT#: 4.5 10*3/uL (ref 1.5–6.5)
NEUT%: 55.5 % (ref 38.4–76.8)
PLATELETS: 238 10*3/uL (ref 145–400)
RBC: 4.43 10*6/uL (ref 3.70–5.45)
RDW: 15.6 % — AB (ref 11.2–14.5)
WBC: 8 10*3/uL (ref 3.9–10.3)
lymph#: 2.7 10*3/uL (ref 0.9–3.3)

## 2014-01-28 LAB — COMPREHENSIVE METABOLIC PANEL (CC13)
ALBUMIN: 3.5 g/dL (ref 3.5–5.0)
ALT: 16 U/L (ref 0–55)
AST: 15 U/L (ref 5–34)
Alkaline Phosphatase: 92 U/L (ref 40–150)
Anion Gap: 12 mEq/L — ABNORMAL HIGH (ref 3–11)
BUN: 16.3 mg/dL (ref 7.0–26.0)
CO2: 25 meq/L (ref 22–29)
Calcium: 9.6 mg/dL (ref 8.4–10.4)
Chloride: 106 mEq/L (ref 98–109)
Creatinine: 1.1 mg/dL (ref 0.6–1.1)
GLUCOSE: 94 mg/dL (ref 70–140)
POTASSIUM: 3.9 meq/L (ref 3.5–5.1)
SODIUM: 143 meq/L (ref 136–145)
TOTAL PROTEIN: 8.2 g/dL (ref 6.4–8.3)
Total Bilirubin: 0.49 mg/dL (ref 0.20–1.20)

## 2014-01-28 LAB — POCT INR: INR: 2.8

## 2014-01-28 LAB — PROTIME-INR
INR: 2.8 (ref 2.00–3.50)
PROTIME: 33.6 s — AB (ref 10.6–13.4)

## 2014-01-28 MED ORDER — LORAZEPAM 2 MG/ML IJ SOLN
1.0000 mg | Freq: Once | INTRAMUSCULAR | Status: AC | PRN
  Administered 2014-01-28: 1 mg via INTRAVENOUS

## 2014-01-28 MED ORDER — ACETAMINOPHEN 325 MG PO TABS
650.0000 mg | ORAL_TABLET | Freq: Once | ORAL | Status: AC
  Administered 2014-01-28: 650 mg via ORAL

## 2014-01-28 MED ORDER — DIPHENHYDRAMINE HCL 25 MG PO CAPS
ORAL_CAPSULE | ORAL | Status: AC
  Filled 2014-01-28: qty 2

## 2014-01-28 MED ORDER — SODIUM CHLORIDE 0.9 % IJ SOLN
10.0000 mL | INTRAMUSCULAR | Status: DC | PRN
  Administered 2014-01-28: 10 mL
  Filled 2014-01-28: qty 10

## 2014-01-28 MED ORDER — HEPARIN SOD (PORK) LOCK FLUSH 100 UNIT/ML IV SOLN
500.0000 [IU] | Freq: Once | INTRAVENOUS | Status: AC | PRN
  Administered 2014-01-28: 500 [IU]
  Filled 2014-01-28: qty 5

## 2014-01-28 MED ORDER — LORAZEPAM 2 MG/ML IJ SOLN
INTRAMUSCULAR | Status: AC
  Filled 2014-01-28: qty 1

## 2014-01-28 MED ORDER — DIPHENHYDRAMINE HCL 25 MG PO CAPS
50.0000 mg | ORAL_CAPSULE | Freq: Once | ORAL | Status: AC
  Administered 2014-01-28: 50 mg via ORAL

## 2014-01-28 MED ORDER — SODIUM CHLORIDE 0.9 % IV SOLN
Freq: Once | INTRAVENOUS | Status: AC
  Administered 2014-01-28: 09:00:00 via INTRAVENOUS

## 2014-01-28 MED ORDER — ACETAMINOPHEN 325 MG PO TABS
ORAL_TABLET | ORAL | Status: AC
  Filled 2014-01-28: qty 2

## 2014-01-28 MED ORDER — TRASTUZUMAB CHEMO INJECTION 440 MG
6.0000 mg/kg | Freq: Once | INTRAVENOUS | Status: AC
  Administered 2014-01-28: 756 mg via INTRAVENOUS
  Filled 2014-01-28: qty 36

## 2014-01-28 NOTE — Patient Instructions (Signed)
[facility] Discharge Instructions for Patients Receiving Chemotherapy  Today you received the following chemotherapy agent: Herceptin   To help prevent nausea and vomiting after your treatment, we encourage you to take your nausea medication as prescribed.    If you develop nausea and vomiting that is not controlled by your nausea medication, call the clinic.   BELOW ARE SYMPTOMS THAT SHOULD BE REPORTED IMMEDIATELY:  *FEVER GREATER THAN 100.5 F  *CHILLS WITH OR WITHOUT FEVER  NAUSEA AND VOMITING THAT IS NOT CONTROLLED WITH YOUR NAUSEA MEDICATION  *UNUSUAL SHORTNESS OF BREATH  *UNUSUAL BRUISING OR BLEEDING  TENDERNESS IN MOUTH AND THROAT WITH OR WITHOUT PRESENCE OF ULCERS  *URINARY PROBLEMS  *BOWEL PROBLEMS  UNUSUAL RASH Items with * indicate a potential emergency and should be followed up as soon as possible.  Feel free to call the clinic you have any questions or concerns. The clinic phone number is [phone].    

## 2014-01-28 NOTE — Progress Notes (Signed)
INR within goal today. Hg/Hct: 12.2/36.4, Pltc = 238 Pt is doing well. No missed doses. No medication changes. No changes in diet. No problems or concerns regarding anticoagulation. No s/s of clotting noted. Continue taking 7.5mg  daily except 5mg  on Fridays.  Recheck INR in 3 weeks on 02/19/14 with scheduled appointments; lab at 8:30am, infusion at 9am and coumadin clinic at 9:15am.

## 2014-01-28 NOTE — Patient Instructions (Signed)
Continue taking 7.5mg  daily except 5mg  on Fridays.  Recheck INR in 3 weeks on 02/19/14 with scheduled appointments; lab at 8:30am, infusion at 9am and coumadin clinic at 9:15am.   We will see you in infusion room.

## 2014-02-17 ENCOUNTER — Other Ambulatory Visit: Payer: Self-pay | Admitting: Pharmacist

## 2014-02-17 DIAGNOSIS — I82409 Acute embolism and thrombosis of unspecified deep veins of unspecified lower extremity: Secondary | ICD-10-CM

## 2014-02-18 ENCOUNTER — Other Ambulatory Visit (HOSPITAL_BASED_OUTPATIENT_CLINIC_OR_DEPARTMENT_OTHER): Payer: Commercial Managed Care - HMO

## 2014-02-18 ENCOUNTER — Ambulatory Visit: Payer: Commercial Managed Care - HMO

## 2014-02-18 ENCOUNTER — Other Ambulatory Visit: Payer: Self-pay | Admitting: *Deleted

## 2014-02-18 ENCOUNTER — Ambulatory Visit (HOSPITAL_BASED_OUTPATIENT_CLINIC_OR_DEPARTMENT_OTHER): Payer: Commercial Managed Care - HMO

## 2014-02-18 ENCOUNTER — Ambulatory Visit: Payer: Commercial Managed Care - HMO | Admitting: Pharmacist

## 2014-02-18 VITALS — BP 135/82 | HR 73 | Temp 98.2°F | Resp 19

## 2014-02-18 DIAGNOSIS — C7951 Secondary malignant neoplasm of bone: Secondary | ICD-10-CM

## 2014-02-18 DIAGNOSIS — Z7901 Long term (current) use of anticoagulants: Secondary | ICD-10-CM

## 2014-02-18 DIAGNOSIS — Z95828 Presence of other vascular implants and grafts: Secondary | ICD-10-CM

## 2014-02-18 DIAGNOSIS — C50912 Malignant neoplasm of unspecified site of left female breast: Secondary | ICD-10-CM

## 2014-02-18 DIAGNOSIS — C787 Secondary malignant neoplasm of liver and intrahepatic bile duct: Secondary | ICD-10-CM

## 2014-02-18 DIAGNOSIS — C50919 Malignant neoplasm of unspecified site of unspecified female breast: Secondary | ICD-10-CM

## 2014-02-18 DIAGNOSIS — I82409 Acute embolism and thrombosis of unspecified deep veins of unspecified lower extremity: Secondary | ICD-10-CM

## 2014-02-18 DIAGNOSIS — C7800 Secondary malignant neoplasm of unspecified lung: Secondary | ICD-10-CM

## 2014-02-18 DIAGNOSIS — C7952 Secondary malignant neoplasm of bone marrow: Secondary | ICD-10-CM

## 2014-02-18 DIAGNOSIS — Z5112 Encounter for antineoplastic immunotherapy: Secondary | ICD-10-CM

## 2014-02-18 LAB — COMPREHENSIVE METABOLIC PANEL (CC13)
ALBUMIN: 3.6 g/dL (ref 3.5–5.0)
ALK PHOS: 99 U/L (ref 40–150)
ALT: 11 U/L (ref 0–55)
AST: 13 U/L (ref 5–34)
Anion Gap: 12 mEq/L — ABNORMAL HIGH (ref 3–11)
BILIRUBIN TOTAL: 0.72 mg/dL (ref 0.20–1.20)
BUN: 32.8 mg/dL — ABNORMAL HIGH (ref 7.0–26.0)
CO2: 25 mEq/L (ref 22–29)
Calcium: 9.5 mg/dL (ref 8.4–10.4)
Chloride: 102 mEq/L (ref 98–109)
Creatinine: 1.4 mg/dL — ABNORMAL HIGH (ref 0.6–1.1)
GLUCOSE: 100 mg/dL (ref 70–140)
POTASSIUM: 3.7 meq/L (ref 3.5–5.1)
Sodium: 140 mEq/L (ref 136–145)
TOTAL PROTEIN: 8.4 g/dL — AB (ref 6.4–8.3)

## 2014-02-18 LAB — CBC WITH DIFFERENTIAL/PLATELET
BASO%: 0.6 % (ref 0.0–2.0)
Basophils Absolute: 0.1 10*3/uL (ref 0.0–0.1)
EOS%: 2.8 % (ref 0.0–7.0)
Eosinophils Absolute: 0.3 10*3/uL (ref 0.0–0.5)
HEMATOCRIT: 35.9 % (ref 34.8–46.6)
HGB: 12.1 g/dL (ref 11.6–15.9)
LYMPH%: 40.6 % (ref 14.0–49.7)
MCH: 27.2 pg (ref 25.1–34.0)
MCHC: 33.8 g/dL (ref 31.5–36.0)
MCV: 80.5 fL (ref 79.5–101.0)
MONO#: 0.8 10*3/uL (ref 0.1–0.9)
MONO%: 7.5 % (ref 0.0–14.0)
NEUT#: 5.4 10*3/uL (ref 1.5–6.5)
NEUT%: 48.5 % (ref 38.4–76.8)
PLATELETS: 265 10*3/uL (ref 145–400)
RBC: 4.46 10*6/uL (ref 3.70–5.45)
RDW: 15.2 % — ABNORMAL HIGH (ref 11.2–14.5)
WBC: 11.2 10*3/uL — AB (ref 3.9–10.3)
lymph#: 4.6 10*3/uL — ABNORMAL HIGH (ref 0.9–3.3)

## 2014-02-18 LAB — PROTIME-INR
INR: 3.1 (ref 2.00–3.50)
PROTIME: 37.2 s — AB (ref 10.6–13.4)

## 2014-02-18 LAB — POCT INR: INR: 3.1

## 2014-02-18 MED ORDER — LORAZEPAM 2 MG/ML IJ SOLN
1.0000 mg | Freq: Once | INTRAMUSCULAR | Status: AC | PRN
  Administered 2014-02-18: 1 mg via INTRAVENOUS

## 2014-02-18 MED ORDER — ACETAMINOPHEN 325 MG PO TABS
650.0000 mg | ORAL_TABLET | Freq: Once | ORAL | Status: AC
  Administered 2014-02-18: 650 mg via ORAL

## 2014-02-18 MED ORDER — TRAMADOL HCL 50 MG PO TABS: 100.0000 mg | ORAL_TABLET | Freq: Four times a day (QID) | ORAL | Status: DC | PRN

## 2014-02-18 MED ORDER — DIPHENHYDRAMINE HCL 25 MG PO CAPS
ORAL_CAPSULE | ORAL | Status: AC
  Filled 2014-02-18: qty 2

## 2014-02-18 MED ORDER — ACETAMINOPHEN 325 MG PO TABS
ORAL_TABLET | ORAL | Status: AC
  Filled 2014-02-18: qty 2

## 2014-02-18 MED ORDER — DIPHENHYDRAMINE HCL 25 MG PO CAPS
50.0000 mg | ORAL_CAPSULE | Freq: Once | ORAL | Status: AC
  Administered 2014-02-18: 50 mg via ORAL

## 2014-02-18 MED ORDER — SODIUM CHLORIDE 0.9 % IV SOLN
Freq: Once | INTRAVENOUS | Status: AC
  Administered 2014-02-18: 09:00:00 via INTRAVENOUS

## 2014-02-18 MED ORDER — LORAZEPAM 2 MG/ML IJ SOLN
INTRAMUSCULAR | Status: AC
  Filled 2014-02-18: qty 1

## 2014-02-18 MED ORDER — SODIUM CHLORIDE 0.9 % IJ SOLN
10.0000 mL | INTRAMUSCULAR | Status: DC | PRN
  Administered 2014-02-18: 10 mL
  Filled 2014-02-18: qty 10

## 2014-02-18 MED ORDER — HEPARIN SOD (PORK) LOCK FLUSH 100 UNIT/ML IV SOLN
500.0000 [IU] | Freq: Once | INTRAVENOUS | Status: AC | PRN
  Administered 2014-02-18: 500 [IU]
  Filled 2014-02-18: qty 5

## 2014-02-18 MED ORDER — ALPRAZOLAM 1 MG PO TABS: 1.0000 mg | ORAL_TABLET | Freq: Three times a day (TID) | ORAL | Status: DC | PRN

## 2014-02-18 MED ORDER — SODIUM CHLORIDE 0.9 % IJ SOLN
10.0000 mL | INTRAMUSCULAR | Status: DC | PRN
  Administered 2014-02-18: 10 mL via INTRAVENOUS
  Filled 2014-02-18: qty 10

## 2014-02-18 MED ORDER — TRASTUZUMAB CHEMO INJECTION 440 MG
6.0000 mg/kg | Freq: Once | INTRAVENOUS | Status: AC
  Administered 2014-02-18: 756 mg via INTRAVENOUS
  Filled 2014-02-18: qty 36

## 2014-02-18 NOTE — Progress Notes (Signed)
INR right at goal Pt is doing well with no complaints Pt seen in infusion area Pt reports no unusual bleeding or bruising No missed or extra doses No medication or diet changes Pt will plan to eat some more spinach tonight from her garden Plan: Continue taking 7.5mg  daily except 5mg  on Fridays.  Recheck INR in 3 weeks on 02/19/14 with scheduled appointments; lab at 8:45am, and MD visit at 9:15am.  We will see you in infusion room.

## 2014-02-18 NOTE — Patient Instructions (Signed)
INR right at goal Continue taking 7.5mg  daily except 5mg  on Fridays.  Recheck INR in 3 weeks on 02/19/14 with scheduled appointments; lab at 8:45am, and MD visit at 9:15am.  We will see you in infusion room.

## 2014-02-18 NOTE — Patient Instructions (Signed)
[facility] Discharge Instructions for Patients Receiving Chemotherapy  Today you received the following chemotherapy agents: Herceptin  To help prevent nausea and vomiting after your treatment, we encourage you to take your nausea medication as prescribed by your physician.    If you develop nausea and vomiting that is not controlled by your nausea medication, call the clinic.   BELOW ARE SYMPTOMS THAT SHOULD BE REPORTED IMMEDIATELY:  *FEVER GREATER THAN 100.5 F  *CHILLS WITH OR WITHOUT FEVER  NAUSEA AND VOMITING THAT IS NOT CONTROLLED WITH YOUR NAUSEA MEDICATION  *UNUSUAL SHORTNESS OF BREATH  *UNUSUAL BRUISING OR BLEEDING  TENDERNESS IN MOUTH AND THROAT WITH OR WITHOUT PRESENCE OF ULCERS  *URINARY PROBLEMS  *BOWEL PROBLEMS  UNUSUAL RASH Items with * indicate a potential emergency and should be followed up as soon as possible.  Feel free to call the clinic you have any questions or concerns. The clinic phone number is [phone].    

## 2014-02-18 NOTE — Patient Instructions (Signed)

## 2014-03-02 ENCOUNTER — Ambulatory Visit (HOSPITAL_COMMUNITY)
Admission: RE | Admit: 2014-03-02 | Discharge: 2014-03-02 | Disposition: A | Discharge status: Home / Self Care | Payer: Medicare HMO | Source: Ambulatory Visit | Attending: Oncology | Admitting: Oncology

## 2014-03-02 ENCOUNTER — Encounter (HOSPITAL_COMMUNITY): Payer: Self-pay

## 2014-03-02 ENCOUNTER — Ambulatory Visit (HOSPITAL_BASED_OUTPATIENT_CLINIC_OR_DEPARTMENT_OTHER)
Admission: RE | Admit: 2014-03-02 | Discharge: 2014-03-02 | Disposition: A | Discharge status: Home / Self Care | Payer: Medicare HMO | Source: Ambulatory Visit | Attending: Oncology | Admitting: Oncology

## 2014-03-02 VITALS — BP 107/71 | HR 58 | Resp 16 | Wt 289.5 lb

## 2014-03-02 DIAGNOSIS — C50919 Malignant neoplasm of unspecified site of unspecified female breast: Secondary | ICD-10-CM | POA: Diagnosis not present

## 2014-03-02 DIAGNOSIS — C50911 Malignant neoplasm of unspecified site of right female breast: Secondary | ICD-10-CM

## 2014-03-02 DIAGNOSIS — C800 Disseminated malignant neoplasm, unspecified: Secondary | ICD-10-CM

## 2014-03-02 DIAGNOSIS — I10 Essential (primary) hypertension: Secondary | ICD-10-CM | POA: Insufficient documentation

## 2014-03-02 DIAGNOSIS — I509 Heart failure, unspecified: Secondary | ICD-10-CM | POA: Insufficient documentation

## 2014-03-02 NOTE — Progress Notes (Signed)
Patient ID: [name], female   DOB: [birth year], 63 y.o.   MRN: [id number] HPI: [name] is a 63 year old with a history of metastatic HER-2 positive breast carcinoma originally diagnosed September 2004. Started in R breast. Underwent neo-adjuvant chemo. During this time developed L breast CA. Underwent bilateral mastectomy. Lymph nodes +. 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   She received a total of 6 cycles of Taxotere/Carbo/Herceptin, completed in April 2005, after which she began single agent Herceptin, given every 3 weeks for 9 years. Plan to continue on Herceptin indefinitely.   She returns for follow up . She continues on Herceptin every 3 weeks. Denies SOB/Pnd Orthopnea. Sleeps poorly at night. Wants to hold off on sleep study. Takes lasix as needed which is usually 3 days a week.     04/23/12 ECHO EF 60-65% Lateral s' 8.9 cm/s  07/30/12 ECHO EF 60-65% Lateral s' 8.3 cm/s  09/11/12 ECHO EF 60-65% Lateral s' 10.3 cm/s  12/22/12 ECHO 55-60% Lateral S' 9.8 RV mildly dilated  07/01/13 ECHO 55-60%, lateral s' 9.79, mild RV dilation, grade II DD 3/15 ECHO 55%, mild MR, lateral s' 9.6, GLS -19.2% 03/02/14 EF 55-60% Lateral S' 9.4 GLS - 21.9   ROS: All systems negative except as listed in HPI, PMH and Problem List.  Past Medical History  Diagnosis Date  . Breast cancer     mets to liver and lung  . Hypertension   . SVC syndrome   . History of chemotherapy Feb. 2006    taxotere/herceptin/carboplatin  . Radiation 07/31/2006    left upper chest  . Radiation 06/17/2006-06/27/2006    6480 cGy bilat. chest wall  . Neuropathy   . Thrombosis   . Breast cancer metastasized to multiple sites 02/26/2013    Current Outpatient Prescriptions  Medication Sig Dispense Refill  . acetaminophen (TYLENOL) 500 MG tablet Take 500 mg by mouth every 6 (six) hours as needed for pain or fever.      . ALPRAZolam (XANAX) 1 MG tablet Take 1 tablet (1 mg  total) by mouth 3 (three) times daily as needed for anxiety.  90 tablet  1  . amLODipine (NORVASC) 10 MG tablet Take 10 mg by mouth every morning.      . baclofen (LIORESAL) 10 MG tablet       . diclofenac sodium (VOLTAREN) 1 % GEL Apply 2 g topically daily as needed (for pain). Apply to knees and shoulders  100 g  6  . furosemide (LASIX) 80 MG tablet Take 80 mg by mouth.      . gabapentin (NEURONTIN) 300 MG capsule 600 mg 2 (two) times daily. TAKE 2 CAPSULES BY MOUTH TWO TIMES DAILY      . losartan (COZAAR) 100 MG tablet Take 100 mg by mouth every morning.      . metoprolol succinate (TOPROL-XL) 50 MG 24 hr tablet Take 50 mg by mouth daily. Take with or immediately following a meal.      . potassium chloride (MICRO-K) 10 MEQ CR capsule TAKE ONE CAPSULE BY MOUTH TWICE DAILY  180 capsule  1  . prochlorperazine (COMPAZINE) 10 MG tablet Take 1 tablet (10 mg total) by mouth every 6 (six) hours as needed (for nausea).  30 tablet  3  . spironolactone (ALDACTONE) 25 MG tablet Take 0.5 tablets (12.5 mg total) by mouth daily.  30 tablet  3  . temazepam (RESTORIL) 30 MG capsule       .  traMADol (ULTRAM) 50 MG tablet Take 2 tablets (100 mg total) by mouth every 6 (six) hours as needed.  120 tablet  1  . warfarin (COUMADIN) 2.5 MG tablet TAKE 1 TABLET BY MOUTH EVERY DAY  30 tablet  2  . warfarin (COUMADIN) 5 MG tablet On Tuesdays and Fridays take two 5 mg tablets to make 10 mg. Take one 2.5 mg tablet along with a 10m to make 7.5 mg all other days  90 tablet  1  . zolpidem (AMBIEN) 10 MG tablet Take 10 mg by mouth at bedtime as needed for sleep.       No current facility-administered medications for this encounter.   Facility-Administered Medications Ordered in Other Encounters  Medication Dose Route Frequency Provider Last Rate Last Dose  . sodium chloride 0.9 % injection 10 mL  10 mL Intravenous PRN GChauncey Cruel MD   10 mL at 12/17/13 1103    Filed Vitals:   03/02/14 1011  BP: 107/71  Pulse: 58   Resp: 16  Weight: 289 lb 8 oz (131.316 kg)  SpO2: 99%    PHYSICAL EXAM: General: Well appearing. No respiratory difficulty Husband present  HEENT: normal  Neck: supple. no JVD difficult to assess d/t body habitus but does not appear elevated; Carotids 2+ bilat; no bruits. No lymphadenopathy or thryomegaly appreciated.  Cor: PMI nondisplaced. Regular rate & rhythm. No rubs, gallops or murmurs. S/P bilateral mastectomies  Lungs: clear  Abdomen: obese. soft, nontender, nondistended. Good bowel sounds.  Extremities: no cyanosis, clubbing, rash, 1+ edema  Neuro: alert & oriented x 3, cranial nerves grossly intact. moves all 4 extremities w/o difficulty. Affect pleasant.  ASSESSMENT & PLAN:  1) L Breast Cancer s/p bilateral mastectomies:  Dr BHaroldine Lawsreviewed her echo today. Doppler parameters stable. Follow up in 3 months with an ECHo   2) Suspected OSA: Patient has thick neck, mild RV dilation, snores at night.  Prefers to hold on sleep study.   3) HTN: Stable .   Follow up in 3 months with an ECHO    CLEGG,AMY NP-C 03/02/2014  Patient seen and examined with ADarrick Grinder NP. We discussed all aspects of the encounter. I agree with the assessment and plan as stated above.   I reviewed echos personally. EF and Doppler parameters stable. No HF on exam. Continue Herceptin.  Quenten Nawaz,MD 10:26 PM

## 2014-03-02 NOTE — Patient Instructions (Signed)
Follow up in 3 months with an ECHO 

## 2014-03-02 NOTE — Progress Notes (Signed)
  Echocardiogram 2D Echocardiogram limited has been performed.  Khalila Buechner FRANCES 03/02/2014, 9:40 AM

## 2014-03-04 ENCOUNTER — Telehealth: Payer: Self-pay | Admitting: *Deleted

## 2014-03-04 ENCOUNTER — Other Ambulatory Visit: Payer: Self-pay | Admitting: *Deleted

## 2014-03-04 MED ORDER — ALBUTEROL SULFATE HFA 108 (90 BASE) MCG/ACT IN AERS: 2.0000 | INHALATION_SPRAY | Freq: Four times a day (QID) | RESPIRATORY_TRACT | Status: DC | PRN

## 2014-03-04 NOTE — Telephone Encounter (Signed)
Notified pt of appt time and provider change on 03/11/14 

## 2014-03-10 ENCOUNTER — Other Ambulatory Visit: Payer: Self-pay | Admitting: *Deleted

## 2014-03-10 DIAGNOSIS — C50919 Malignant neoplasm of unspecified site of unspecified female breast: Secondary | ICD-10-CM

## 2014-03-11 ENCOUNTER — Ambulatory Visit (HOSPITAL_BASED_OUTPATIENT_CLINIC_OR_DEPARTMENT_OTHER): Payer: Self-pay | Admitting: Pharmacist

## 2014-03-11 ENCOUNTER — Other Ambulatory Visit (HOSPITAL_BASED_OUTPATIENT_CLINIC_OR_DEPARTMENT_OTHER): Payer: Commercial Managed Care - HMO

## 2014-03-11 ENCOUNTER — Encounter: Payer: Self-pay | Admitting: Hematology

## 2014-03-11 ENCOUNTER — Ambulatory Visit (HOSPITAL_BASED_OUTPATIENT_CLINIC_OR_DEPARTMENT_OTHER): Payer: Commercial Managed Care - HMO

## 2014-03-11 ENCOUNTER — Ambulatory Visit (HOSPITAL_BASED_OUTPATIENT_CLINIC_OR_DEPARTMENT_OTHER): Payer: Commercial Managed Care - HMO | Admitting: Hematology

## 2014-03-11 VITALS — BP 153/93 | HR 66 | Temp 97.9°F | Resp 20 | Ht 63.0 in | Wt 287.9 lb

## 2014-03-11 DIAGNOSIS — C7952 Secondary malignant neoplasm of bone marrow: Secondary | ICD-10-CM

## 2014-03-11 DIAGNOSIS — G8929 Other chronic pain: Secondary | ICD-10-CM

## 2014-03-11 DIAGNOSIS — C7800 Secondary malignant neoplasm of unspecified lung: Secondary | ICD-10-CM

## 2014-03-11 DIAGNOSIS — Z171 Estrogen receptor negative status [ER-]: Secondary | ICD-10-CM

## 2014-03-11 DIAGNOSIS — C50919 Malignant neoplasm of unspecified site of unspecified female breast: Secondary | ICD-10-CM

## 2014-03-11 DIAGNOSIS — Z5112 Encounter for antineoplastic immunotherapy: Secondary | ICD-10-CM

## 2014-03-11 DIAGNOSIS — C7951 Secondary malignant neoplasm of bone: Secondary | ICD-10-CM | POA: Diagnosis not present

## 2014-03-11 DIAGNOSIS — C787 Secondary malignant neoplasm of liver and intrahepatic bile duct: Secondary | ICD-10-CM | POA: Diagnosis not present

## 2014-03-11 DIAGNOSIS — I871 Compression of vein: Secondary | ICD-10-CM

## 2014-03-11 DIAGNOSIS — I82409 Acute embolism and thrombosis of unspecified deep veins of unspecified lower extremity: Secondary | ICD-10-CM

## 2014-03-11 LAB — COMPREHENSIVE METABOLIC PANEL (CC13)
ALK PHOS: 100 U/L (ref 40–150)
ALT: 13 U/L (ref 0–55)
AST: 14 U/L (ref 5–34)
Albumin: 3.5 g/dL (ref 3.5–5.0)
Anion Gap: 8 mEq/L (ref 3–11)
BILIRUBIN TOTAL: 0.44 mg/dL (ref 0.20–1.20)
BUN: 16.5 mg/dL (ref 7.0–26.0)
CALCIUM: 9.8 mg/dL (ref 8.4–10.4)
CHLORIDE: 103 meq/L (ref 98–109)
CO2: 30 mEq/L — ABNORMAL HIGH (ref 22–29)
CREATININE: 1 mg/dL (ref 0.6–1.1)
Glucose: 98 mg/dl (ref 70–140)
Potassium: 4.4 mEq/L (ref 3.5–5.1)
Sodium: 142 mEq/L (ref 136–145)
Total Protein: 8.2 g/dL (ref 6.4–8.3)

## 2014-03-11 LAB — CBC WITH DIFFERENTIAL/PLATELET
BASO%: 1 % (ref 0.0–2.0)
BASOS ABS: 0.1 10*3/uL (ref 0.0–0.1)
EOS%: 1.7 % (ref 0.0–7.0)
Eosinophils Absolute: 0.1 10*3/uL (ref 0.0–0.5)
HCT: 36.1 % (ref 34.8–46.6)
HGB: 12.1 g/dL (ref 11.6–15.9)
LYMPH%: 30.6 % (ref 14.0–49.7)
MCH: 26.8 pg (ref 25.1–34.0)
MCHC: 33.4 g/dL (ref 31.5–36.0)
MCV: 80.4 fL (ref 79.5–101.0)
MONO#: 0.6 10*3/uL (ref 0.1–0.9)
MONO%: 8.1 % (ref 0.0–14.0)
NEUT#: 4.6 10*3/uL (ref 1.5–6.5)
NEUT%: 58.6 % (ref 38.4–76.8)
Platelets: 248 10*3/uL (ref 145–400)
RBC: 4.49 10*6/uL (ref 3.70–5.45)
RDW: 15.7 % — ABNORMAL HIGH (ref 11.2–14.5)
WBC: 7.9 10*3/uL (ref 3.9–10.3)
lymph#: 2.4 10*3/uL (ref 0.9–3.3)

## 2014-03-11 LAB — PROTHROMBIN TIME
INR: 4.29 — AB (ref <1.50)
Prothrombin Time: 41.2 seconds — ABNORMAL HIGH (ref 11.6–15.2)

## 2014-03-11 LAB — PROTIME-INR

## 2014-03-11 LAB — POCT INR: INR: 4.29

## 2014-03-11 MED ORDER — SODIUM CHLORIDE 0.9 % IJ SOLN
10.0000 mL | INTRAMUSCULAR | Status: DC | PRN
Start: 2014-03-11 — End: 2014-03-11
  Administered 2014-03-11: 10 mL
  Filled 2014-03-11: qty 10

## 2014-03-11 MED ORDER — ACETAMINOPHEN 325 MG PO TABS
650.0000 mg | ORAL_TABLET | Freq: Once | ORAL | Status: AC
  Administered 2014-03-11: 650 mg via ORAL

## 2014-03-11 MED ORDER — DIPHENHYDRAMINE HCL 25 MG PO CAPS
50.0000 mg | ORAL_CAPSULE | Freq: Once | ORAL | Status: AC
  Administered 2014-03-11: 50 mg via ORAL

## 2014-03-11 MED ORDER — SODIUM CHLORIDE 0.9 % IV SOLN
Freq: Once | INTRAVENOUS | Status: AC
  Administered 2014-03-11: 11:00:00 via INTRAVENOUS

## 2014-03-11 MED ORDER — TRASTUZUMAB CHEMO INJECTION 440 MG
6.0000 mg/kg | Freq: Once | INTRAVENOUS | Status: AC
  Administered 2014-03-11: 756 mg via INTRAVENOUS
  Filled 2014-03-11: qty 36

## 2014-03-11 MED ORDER — LORAZEPAM 2 MG/ML IJ SOLN: 1.0000 mg | Freq: Once | INTRAMUSCULAR | Status: DC | PRN

## 2014-03-11 MED ORDER — ACETAMINOPHEN 325 MG PO TABS
ORAL_TABLET | ORAL | Status: AC
  Filled 2014-03-11: qty 2

## 2014-03-11 MED ORDER — HEPARIN SOD (PORK) LOCK FLUSH 100 UNIT/ML IV SOLN
500.0000 [IU] | Freq: Once | INTRAVENOUS | Status: AC | PRN
  Administered 2014-03-11: 500 [IU]
  Filled 2014-03-11: qty 5

## 2014-03-11 MED ORDER — DIPHENHYDRAMINE HCL 25 MG PO CAPS
ORAL_CAPSULE | ORAL | Status: AC
  Filled 2014-03-11: qty 2

## 2014-03-11 NOTE — Patient Instructions (Addendum)
INR above goal today due to diet changes No coumadin today or tomorrow  then Continue taking 7.5mg  daily except 5mg  on Fridays.  Recheck INR in 3 weeks on 04/01/14 with scheduled appointments; We will see you in infusion room. Make sure to increase your "greens" intake

## 2014-03-11 NOTE — Patient Instructions (Signed)
[facility] Discharge Instructions for Patients Receiving Chemotherapy  Today you received the following chemotherapy agents herceptin   To help prevent nausea and vomiting after your treatment, we encourage you to take your nausea medication as directed   If you develop nausea and vomiting that is not controlled by your nausea medication, call the clinic.   BELOW ARE SYMPTOMS THAT SHOULD BE REPORTED IMMEDIATELY:  *FEVER GREATER THAN 100.5 F  *CHILLS WITH OR WITHOUT FEVER  NAUSEA AND VOMITING THAT IS NOT CONTROLLED WITH YOUR NAUSEA MEDICATION  *UNUSUAL SHORTNESS OF BREATH  *UNUSUAL BRUISING OR BLEEDING  TENDERNESS IN MOUTH AND THROAT WITH OR WITHOUT PRESENCE OF ULCERS  *URINARY PROBLEMS  *BOWEL PROBLEMS  UNUSUAL RASH Items with * indicate a potential emergency and should be followed up as soon as possible.  Feel free to call the clinic you have any questions or concerns. The clinic phone number is [phone].  

## 2014-03-11 NOTE — Progress Notes (Signed)
[facility]  [phone] [phone]  OFFICE PROGRESS NOTE  ID: [name]   DOB: 1951-01-15  MR#: [id number]  PPI#:[id number]  PCP: Yolanda Kiel, MD GYN:  SU:  OTHER MD: [name], [name]  CC: Patient for follow-up of her 2 positive breast cancer.   HISTORY OF PRESENT ILLNESS:    From [name] note 06/04/2003: "This [name] has been in good health all of her life.  She noted a swelling and discomfort in her right breast in June of this year.  She was seen in the Emergency Room in [place] and was treated for mastitis.  She was treated for a number of months with mastitis and the swelling did not get better. She was given hydrocodone and Cipro.  Finally, the swelling did get better and ultimately the nipple became retracted and she noticed some dimpling in her skin. She had a mammogram in July of this year in [place] with subsequent mammogram on May 27, 2003, by Dr. Isaiah Davis.  Mammogram done on September 30 showed marked increased density in the left breast.  Biopsy was performed the same day.  It was noted at the 12 o'clock position, deep in the breast was a focal hypoechoic mass, at least 3.5 cm in diameter.  Biopsy did in fact show invasive in situ mammary carcinoma. This was felt to be both at least intermediate, high grade.  No definite lymphovascular invasion was identified. ER and PR, Her2 testing is pending.  [name] continues to have pain in her breast.  She continues to take hydrocodone a number of times a day.  She has been seen by Dr. Rosana Davis, who felt that neoadjuvant chemotherapy would be required.    Initial staging studies showed evidence of liver and lung mets.   Patient also has evidence of bone lesions. Patient started neoadjuvant chemotherapy, Taxotere/Carbo/Herceptin in October 2004.   Patient had a CT scan in December 2004 which demonstrated extensive clot in the SVC innominate vein, bilateral  jugular vein and  She was started on anticoagulation therapy. She received a total of 6 cycles of Taxotere/Carbo/Herceptin, completed in April 2005."  Patient has been on  trastuzumab continued indefinitely; has also received lapatinib and capecitabine for variable intervals in 2007-2008. Most recent echo 12/01/2013 showed an ejection fraction of 55%. She is status post bilateral mastectomies with bilateral axillary lymph node dissection 12/07/2004, showing (a) on the right, a mypT1c ypN1 invasive ductal carcinoma, grade 3, estrogen and progesterone receptor negative, HER-2 positive, with an MIB-1 of 31% (b) on the left, ypT2 ypN1 invasive ductal carcinoma, grade 2, estrogen and progesterone receptor negative, HER-2 positive, with an MIB-1 of 35%. She is Status post radiation June through July of 2006, to the right chest wall, left chest wall, bilateral supraclavicular fossae, and bilateral axillary boosts; with additional radiation to the right and left chest walls and the central chest wall completed November of 2007. She is status post ixempra x9 completed August of 2009. 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 Patient has chronic pain, with negative PET scan 08/24/2013 (no evidence of active cancer). On Neurontin and Tramadol therapy.  INTERVAL HISTORY: [name] returns today accompanied by her husband, [name], for follow up of her metastatic breast cancer. Since her last visit here she went off her narcotics, she says with but no difficulty". She didn't notice any changes. Patient has lymphedema, arthritis and some some occasional headaches. She  takes Tramadol for pain which works.She gets Herceptin today.  REVIEW OF SYSTEMS: [name] has chronic pain problems which relate chiefly to her neck and shoulders but also to her knees and hips. The pain is not more intense or persistent that before. For a long time she had  been on narcotics, but she successfully went off the narcotics last year and has been on her current pain medicine since. The problem is she really feels that tramadol is not quite doing it. Aside from the pain issue, which is very stable as noted, just some shortness of breath when walking up stairs and she does not think she can walk 200 feet. She continues to feel anxious and when she's not anxious she is irritable. She is excited though because her son got married this year. Aside from this a detailed review systems today was stable  PAST MEDICAL HISTORY: Past Medical History  Diagnosis Date  . Breast cancer     mets to liver and lung  . Hypertension   . SVC syndrome   . History of chemotherapy Feb. 2006    taxotere/herceptin/carboplatin  . Radiation 07/31/2006    left upper chest  . Radiation 06/17/2006-06/27/2006    6480 cGy bilat. chest wall  . Neuropathy   . Thrombosis   . Breast cancer metastasized to multiple sites 02/26/2013    PAST SURGICAL HISTORY: Past Surgical History  Procedure Laterality Date  . Tubal ligation  1986  . Cholecystectomy  1989  . Mastectomy Bilateral   . Ankle surgery    . Peripherally inserted central catheter insertion    . Back surgery      FAMILY HISTORY Family History  Problem Relation Age of Onset  . Heart failure Father   . Cancer Father     Prostate cancer  . Heart failure Brother   . Cancer Brother     Prostate cancer  . Diabetes Maternal Aunt    She had three brothers, one died of gunshot wound, one of complications of diabetes mellitus and one of myocardial infarction.  She has no sisters.  Mother died of complications of brain metastasis in 39.  Father has had a myocardial infarction in 1999.  No history of breast or ovarian cancer in the family.     GYNECOLOGIC HISTORY:   Menarche at age 14.  Gravida 3, para 3.  First live birth at age 89.  No history of breast feeding. No history of hormonal replacement therapy.   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 Her husband used to work as a Art gallery manager, but is now retired. She has three children, Monette who lives in [place] and works as a Hydrographic surveyor, Financial risk analyst who lives in [place] and works as a Administrator, and Camden who lives in [place] and also works as a Hydrographic surveyor. The patient has 12 grandchildren and 4 great-grandchildren. She attends a [employer].  ADVANCED DIRECTIVES:  Not in place  HEALTH MAINTENANCE: (Updated 06/12/2013) History  Substance Use Topics  . Smoking status: Never Smoker   . Smokeless tobacco: Never Used  . Alcohol Use: Yes     Comment: occasional     Colonoscopy: Never and "I don't want one"  PAP:  1987  Bone density:  Never  Lipid panel:  Not on file    Allergies  Allergen Reactions  . Adhesive [Tape] Other (See Comments)    Tears  skin   . Penicillins Hives    Current Outpatient Prescriptions  Medication Sig Dispense Refill  . acetaminophen (TYLENOL) 500 MG tablet Take 500 mg by mouth every 6 (six) hours as needed for pain or fever.      Marland Kitchen albuterol (PROVENTIL HFA;VENTOLIN HFA) 108 (90 BASE) MCG/ACT inhaler Inhale 2 puffs into the lungs every 6 (six) hours as needed for wheezing.  1 Inhaler  3  . ALPRAZolam (XANAX) 1 MG tablet Take 1 tablet (1 mg total) by mouth 3 (three) times daily as needed for anxiety.  90 tablet  1  . amLODipine (NORVASC) 10 MG tablet Take 10 mg by mouth every morning.      . baclofen (LIORESAL) 10 MG tablet       . diclofenac sodium (VOLTAREN) 1 % GEL Apply 2 g topically daily as needed (for pain). Apply to knees and shoulders  100 g  6  . furosemide (LASIX) 80 MG tablet Take 80 mg by mouth.      . gabapentin (NEURONTIN) 300 MG capsule 600 mg 2 (two) times daily. TAKE 2 CAPSULES BY MOUTH TWO TIMES DAILY      . losartan (COZAAR) 100 MG tablet Take 100 mg by mouth every morning.      . metoprolol succinate (TOPROL-XL)  50 MG 24 hr tablet Take 50 mg by mouth daily. Take with or immediately following a meal.      . potassium chloride (MICRO-K) 10 MEQ CR capsule TAKE ONE CAPSULE BY MOUTH TWICE DAILY  180 capsule  1  . prochlorperazine (COMPAZINE) 10 MG tablet Take 1 tablet (10 mg total) by mouth every 6 (six) hours as needed (for nausea).  30 tablet  3  . spironolactone (ALDACTONE) 25 MG tablet Take 0.5 tablets (12.5 mg total) by mouth daily.  30 tablet  3  . temazepam (RESTORIL) 30 MG capsule       . traMADol (ULTRAM) 50 MG tablet Take 2 tablets (100 mg total) by mouth every 6 (six) hours as needed.  120 tablet  1  . warfarin (COUMADIN) 2.5 MG tablet TAKE 1 TABLET BY MOUTH EVERY DAY  30 tablet  2  . warfarin (COUMADIN) 5 MG tablet On Tuesdays and Fridays take two 5 mg tablets to make 10 mg. Take one 2.5 mg tablet along with a 58m to make 7.5 mg all other days  90 tablet  1  . zolpidem (AMBIEN) 10 MG tablet Take 10 mg by mouth at bedtime as needed for sleep.       No current facility-administered medications for this visit.   Facility-Administered Medications Ordered in Other Visits  Medication Dose Route Frequency Provider Last Rate Last Dose  . sodium chloride 0.9 % injection 10 mL  10 mL Intravenous PRN GChauncey Cruel MD   10 mL at 12/17/13 1103    OBJECTIVE:  Middle aged African American [name] who appears stated age F48Vitals:   03/11/14 0937  BP: 153/93  Pulse: 66  Temp: 97.9 F (36.6 C)  Resp: 20     Body mass index is 51.01 kg/(m^2).    ECOG FS: 1 Filed Weights   03/11/14 0937  Weight: 287 lb 14.4 oz (130.591 kg)   Filed Weights   03/11/14 0937  Weight: 287 lb 14.4 oz (130.591 kg)   Sclerae unicteric, pupils equal and reactive Oropharynx clear and moist No cervical or supraclavicular adenopathy Lungs no rales or rhonchi Heart regular rate and rhythm Abd soft, obese, nontender, positive  bowel sounds MSK no focal spinal tenderness, no upper extremity lymphedema, poor range of motion  in both upper extremities Neuro: nonfocal, well oriented, appropriate affect Breasts: Status post bilateral mastectomies. The incisions have healed and a very irregular pattern, but there is no evidence of local recurrence. Both axillae are benign.    LAB RESULTS: Lab Results  Component Value Date   WBC 7.9 03/11/2014   NEUTROABS 4.6 03/11/2014   HGB 12.1 03/11/2014   HCT 36.1 03/11/2014   MCV 80.4 03/11/2014   PLT 248 03/11/2014      Chemistry      Component Value Date/Time   NA 142 03/11/2014 0912   NA 138 03/21/2013 0658   K 4.4 03/11/2014 0912   K 3.6 03/21/2013 0658   CL 103 03/21/2013 0658   CL 104 01/30/2013 0850   CO2 30* 03/11/2014 0912   CO2 27 03/21/2013 0658   BUN 16.5 03/11/2014 0912   BUN 11 03/21/2013 0658   CREATININE 1.0 03/11/2014 0912   CREATININE 0.82 03/21/2013 0658      Component Value Date/Time   CALCIUM 9.8 03/11/2014 0912   CALCIUM 9.5 03/21/2013 0658   ALKPHOS 100 03/11/2014 0912   ALKPHOS 87 04/11/2012 0837   AST 14 03/11/2014 0912   AST 19 04/11/2012 0837   ALT 13 03/11/2014 0912   ALT 14 04/11/2012 0837   BILITOT 0.44 03/11/2014 0912   BILITOT 0.8 04/11/2012 0837       ASSESSMENT: 63 y.o.  [name], [name], [name]  (1)  with a history of inflammatory right breast cancer metastatic at presentation September 2004 with involvement of liver and bone, HER-2 positive, estrogen and progesterone receptor negative  (2) treated with carboplatin, docetaxel and Herceptin x6 completed April 2005  (3) trastuzumab continued indefinitely; has also received lapatinib and capecitabine for variable intervals in 2007-2008. Most recent echo 12/01/2013 showed an ejection fraction of 55%.  (4) status post bilateral mastectomies with bilateral axillary lymph node dissection 12/07/2004, showing  (a) on the right, a mypT1c ypN1 invasive ductal carcinoma, grade 3, estrogen and progesterone receptor negative, HER-2 positive, with an MIB-1 of 31%  (b) on the left, ypT2 ypN1  invasive ductal carcinoma, grade 2, estrogen and progesterone receptor negative, HER-2 positive, with an MIB-1 of 35%.  (5)  Status post radiation June through July of 2006, to the right chest wall, left chest wall, bilateral supraclavicular fossae, and bilateral axillary boosts; with additional radiation to the right and left chest walls and the central chest wall completed November of 2007  (6) status post ixempra x9 completed August of 2009.  (7) history of superior vena caval syndrome, on life long anticoagulation   (8)  History of chemotherapy-induced neuropathy.   (9)  chronic pain, with negative PET scan 08/24/2013 (no evidence of active cancer). On Neurontin and Tramadol   PLAN:  1. We will continue herceptin indefinitely as long as it is working.  2. Patient getting a PET SCAN on 06/01/14 and then a followup with Dr Jana Hakim.  3. Her echo was fine a week ago.  4. Her next herceptin therapies are 03/11/14, 04/01/14, 04/22/14, 05/13/14 and so forth.  5.We also manage her coumadin and adjust it based on her INR.  6. It was a pleasure meeting [name] and her husband wearing the "pink breast cancer shirts".      Bernadene Bell, MD Medical Hematologist/Oncologist [place] Pager: [phone] Office No: [phone]   03/11/2014  10:01 AM

## 2014-03-11 NOTE — Progress Notes (Signed)
INR above goal today due to diet changes Pt is doing well and seen in infusion area Pt states she has not been eating any "greens"  Pt's diet usually includes greens from her garden but currently these are not in season No missed or extra doses No bleeding or bruising Pt states she will get some spinach/greens from the store tonight and get back to her normal diet No other medication or diet changes Plan: No coumadin today or tomorrow  then Continue taking 7.5mg  daily except 5mg  on Fridays.  Recheck INR in 3 weeks on 04/01/14 with scheduled appointments; We will see you in infusion room.

## 2014-03-15 ENCOUNTER — Telehealth: Payer: Self-pay | Admitting: Hematology

## 2014-03-15 ENCOUNTER — Other Ambulatory Visit: Payer: Self-pay | Admitting: *Deleted

## 2014-03-15 DIAGNOSIS — C50919 Malignant neoplasm of unspecified site of unspecified female breast: Secondary | ICD-10-CM

## 2014-03-15 NOTE — Telephone Encounter (Signed)
per pof to sch pt appts-sent email to MW to sch chemo-wikll call pt after reply-pof just put in today

## 2014-03-16 ENCOUNTER — Telehealth: Payer: Self-pay | Admitting: *Deleted

## 2014-03-16 ENCOUNTER — Other Ambulatory Visit: Payer: Self-pay | Admitting: *Deleted

## 2014-03-16 NOTE — Progress Notes (Signed)
Pt left message stating she spoke with scheduling regarding her chemo dates and times - " and they told me they are being worked on - and I told them I need early am times due to my work schedule and then they told me all about rules and such - and I have done this for years now-"   This RN left VM for [place] in Infusion scheduling per pt's request.

## 2014-03-16 NOTE — Telephone Encounter (Signed)
Per POF staff phone call scheduled appts. Advised schedulers 

## 2014-03-17 ENCOUNTER — Telehealth: Payer: Self-pay | Admitting: *Deleted

## 2014-03-17 NOTE — Telephone Encounter (Signed)
Per staff message and POF I have scheduled appts. Advised scheduler of appts. JMW  

## 2014-03-17 NOTE — Progress Notes (Signed)
Sent August appointment calendar to pt by mail.  TKF

## 2014-03-18 ENCOUNTER — Telehealth: Payer: Self-pay | Admitting: Hematology

## 2014-03-18 NOTE — Telephone Encounter (Signed)
per pof to sch pt appt-reply from MW that sch trmts-cld & spoke with pt and adv i would mail copy of sch out-gave pt times & dates-pt understood

## 2014-03-31 ENCOUNTER — Other Ambulatory Visit: Payer: Self-pay | Admitting: *Deleted

## 2014-04-01 ENCOUNTER — Ambulatory Visit: Payer: Commercial Managed Care - HMO | Admitting: Pharmacist

## 2014-04-01 ENCOUNTER — Ambulatory Visit: Payer: Commercial Managed Care - HMO

## 2014-04-01 ENCOUNTER — Encounter: Payer: Self-pay | Admitting: Hematology

## 2014-04-01 ENCOUNTER — Ambulatory Visit (HOSPITAL_BASED_OUTPATIENT_CLINIC_OR_DEPARTMENT_OTHER): Payer: Commercial Managed Care - HMO

## 2014-04-01 ENCOUNTER — Other Ambulatory Visit (HOSPITAL_BASED_OUTPATIENT_CLINIC_OR_DEPARTMENT_OTHER): Payer: Commercial Managed Care - HMO

## 2014-04-01 ENCOUNTER — Ambulatory Visit (HOSPITAL_BASED_OUTPATIENT_CLINIC_OR_DEPARTMENT_OTHER): Payer: Commercial Managed Care - HMO | Admitting: Hematology

## 2014-04-01 VITALS — BP 143/88 | HR 64 | Temp 98.7°F | Resp 18 | Ht 63.0 in | Wt 291.0 lb

## 2014-04-01 DIAGNOSIS — C7951 Secondary malignant neoplasm of bone: Secondary | ICD-10-CM

## 2014-04-01 DIAGNOSIS — I82409 Acute embolism and thrombosis of unspecified deep veins of unspecified lower extremity: Secondary | ICD-10-CM

## 2014-04-01 DIAGNOSIS — C787 Secondary malignant neoplasm of liver and intrahepatic bile duct: Secondary | ICD-10-CM

## 2014-04-01 DIAGNOSIS — I871 Compression of vein: Secondary | ICD-10-CM

## 2014-04-01 DIAGNOSIS — C50919 Malignant neoplasm of unspecified site of unspecified female breast: Secondary | ICD-10-CM

## 2014-04-01 DIAGNOSIS — Z7901 Long term (current) use of anticoagulants: Secondary | ICD-10-CM

## 2014-04-01 DIAGNOSIS — Z5112 Encounter for antineoplastic immunotherapy: Secondary | ICD-10-CM

## 2014-04-01 DIAGNOSIS — F411 Generalized anxiety disorder: Secondary | ICD-10-CM

## 2014-04-01 DIAGNOSIS — C7800 Secondary malignant neoplasm of unspecified lung: Secondary | ICD-10-CM

## 2014-04-01 DIAGNOSIS — C7952 Secondary malignant neoplasm of bone marrow: Secondary | ICD-10-CM

## 2014-04-01 DIAGNOSIS — G8929 Other chronic pain: Secondary | ICD-10-CM

## 2014-04-01 LAB — CBC WITH DIFFERENTIAL/PLATELET
BASO%: 0.5 % (ref 0.0–2.0)
Basophils Absolute: 0.1 10*3/uL (ref 0.0–0.1)
EOS%: 1.6 % (ref 0.0–7.0)
Eosinophils Absolute: 0.2 10*3/uL (ref 0.0–0.5)
HEMATOCRIT: 35.7 % (ref 34.8–46.6)
HGB: 11.9 g/dL (ref 11.6–15.9)
LYMPH#: 3.3 10*3/uL (ref 0.9–3.3)
LYMPH%: 29.6 % (ref 14.0–49.7)
MCH: 26.6 pg (ref 25.1–34.0)
MCHC: 33.3 g/dL (ref 31.5–36.0)
MCV: 80.1 fL (ref 79.5–101.0)
MONO#: 0.8 10*3/uL (ref 0.1–0.9)
MONO%: 6.8 % (ref 0.0–14.0)
NEUT#: 6.8 10*3/uL — ABNORMAL HIGH (ref 1.5–6.5)
NEUT%: 61.5 % (ref 38.4–76.8)
Platelets: 265 10*3/uL (ref 145–400)
RBC: 4.46 10*6/uL (ref 3.70–5.45)
RDW: 15.6 % — ABNORMAL HIGH (ref 11.2–14.5)
WBC: 11.1 10*3/uL — AB (ref 3.9–10.3)

## 2014-04-01 LAB — COMPREHENSIVE METABOLIC PANEL (CC13)
ALT: 14 U/L (ref 0–55)
AST: 16 U/L (ref 5–34)
Albumin: 3.5 g/dL (ref 3.5–5.0)
Alkaline Phosphatase: 95 U/L (ref 40–150)
Anion Gap: 10 mEq/L (ref 3–11)
BILIRUBIN TOTAL: 0.73 mg/dL (ref 0.20–1.20)
BUN: 17.2 mg/dL (ref 7.0–26.0)
CALCIUM: 9.9 mg/dL (ref 8.4–10.4)
CHLORIDE: 106 meq/L (ref 98–109)
CO2: 26 meq/L (ref 22–29)
Creatinine: 0.9 mg/dL (ref 0.6–1.1)
Glucose: 103 mg/dl (ref 70–140)
Potassium: 4.1 mEq/L (ref 3.5–5.1)
Sodium: 141 mEq/L (ref 136–145)
TOTAL PROTEIN: 8.4 g/dL — AB (ref 6.4–8.3)

## 2014-04-01 LAB — PROTIME-INR
INR: 2 (ref 2.00–3.50)
PROTIME: 24 s — AB (ref 10.6–13.4)

## 2014-04-01 LAB — POCT INR: INR: 2

## 2014-04-01 MED ORDER — SODIUM CHLORIDE 0.9 % IV SOLN
Freq: Once | INTRAVENOUS | Status: AC
  Administered 2014-04-01: 11:00:00 via INTRAVENOUS

## 2014-04-01 MED ORDER — LORAZEPAM 2 MG/ML IJ SOLN
1.0000 mg | Freq: Once | INTRAMUSCULAR | Status: AC | PRN
  Administered 2014-04-01: 1 mg via INTRAVENOUS

## 2014-04-01 MED ORDER — TRASTUZUMAB CHEMO INJECTION 440 MG
6.0000 mg/kg | Freq: Once | INTRAVENOUS | Status: AC
  Administered 2014-04-01: 756 mg via INTRAVENOUS
  Filled 2014-04-01: qty 36

## 2014-04-01 MED ORDER — ACETAMINOPHEN 325 MG PO TABS
650.0000 mg | ORAL_TABLET | Freq: Once | ORAL | Status: AC
  Administered 2014-04-01: 650 mg via ORAL

## 2014-04-01 MED ORDER — SODIUM CHLORIDE 0.9 % IJ SOLN
10.0000 mL | INTRAMUSCULAR | Status: DC | PRN
  Administered 2014-04-01: 10 mL
  Filled 2014-04-01: qty 10

## 2014-04-01 MED ORDER — ACETAMINOPHEN 325 MG PO TABS
ORAL_TABLET | ORAL | Status: AC
  Filled 2014-04-01: qty 2

## 2014-04-01 MED ORDER — LORAZEPAM 2 MG/ML IJ SOLN
INTRAMUSCULAR | Status: AC
  Filled 2014-04-01: qty 1

## 2014-04-01 MED ORDER — DIPHENHYDRAMINE HCL 25 MG PO CAPS
50.0000 mg | ORAL_CAPSULE | Freq: Once | ORAL | Status: AC
  Administered 2014-04-01: 50 mg via ORAL

## 2014-04-01 MED ORDER — HEPARIN SOD (PORK) LOCK FLUSH 100 UNIT/ML IV SOLN
500.0000 [IU] | Freq: Once | INTRAVENOUS | Status: AC | PRN
  Administered 2014-04-01: 500 [IU]
  Filled 2014-04-01: qty 5

## 2014-04-01 MED ORDER — DIPHENHYDRAMINE HCL 25 MG PO CAPS
ORAL_CAPSULE | ORAL | Status: AC
  Filled 2014-04-01: qty 2

## 2014-04-01 MED ORDER — SODIUM CHLORIDE 0.9 % IJ SOLN
10.0000 mL | INTRAMUSCULAR | Status: DC | PRN
  Filled 2014-04-01: qty 10

## 2014-04-01 NOTE — Progress Notes (Signed)
INR = 2.0 Goal 2-3 Patient was seen in the infusion area. No complications of anticoagulation noted. Patient states she has resumed her regular diet and is eating a lot of greens. She consistently eats this diet. No medication changes. Continue taking 7.5mg  daily except 5mg  on Fridays. Recheck INR in 3 weeks on 04/22/14 with lab at 8:45, flush at 9:00, MD appt at 9:30, and Herceptin at 10:30. We will see her in infusion room.  Theone Murdoch, PharmD

## 2014-04-01 NOTE — Patient Instructions (Signed)
[facility] Discharge Instructions for Patients Receiving Chemotherapy  Today you received the following chemotherapy agents Herceptin.  To help prevent nausea and vomiting after your treatment, we encourage you to take your nausea medication as prescribed.   If you develop nausea and vomiting that is not controlled by your nausea medication, call the clinic.   BELOW ARE SYMPTOMS THAT SHOULD BE REPORTED IMMEDIATELY:  *FEVER GREATER THAN 100.5 F  *CHILLS WITH OR WITHOUT FEVER  NAUSEA AND VOMITING THAT IS NOT CONTROLLED WITH YOUR NAUSEA MEDICATION  *UNUSUAL SHORTNESS OF BREATH  *UNUSUAL BRUISING OR BLEEDING  TENDERNESS IN MOUTH AND THROAT WITH OR WITHOUT PRESENCE OF ULCERS  *URINARY PROBLEMS  *BOWEL PROBLEMS  UNUSUAL RASH Items with * indicate a potential emergency and should be followed up as soon as possible.  Feel free to call the clinic you have any questions or concerns. The clinic phone number is [phone].    

## 2014-04-01 NOTE — Progress Notes (Signed)
[place]  [phone] [phone]  OFFICE PROGRESS NOTE  ID: [name]   DOB: Dec 09, 1950  MR#: [id number]  PYP#:[id number]  PCP: Ernestene Kiel, MD GYN:  SU:  OTHER MD: Pierre Bali, Gracelyn Nurse  CC: Patient for follow-up of her 2 positive breast cancer.   HISTORY OF PRESENT ILLNESS:    From Dr. Julien Girt note 06/04/2003: "This woman has been in good health all of her life.  She noted a swelling and discomfort in her right breast in June of this year.  She was seen in the Emergency Room in [place] and was treated for mastitis.  She was treated for a number of months with mastitis and the swelling did not get better. She was given hydrocodone and Cipro.  Finally, the swelling did get better and ultimately the nipple became retracted and she noticed some dimpling in her skin. She had a mammogram in July of this year in [place] with subsequent mammogram on May 27, 2003, by Dr. Isaiah Blakes.  Mammogram done on September 30 showed marked increased density in the left breast.  Biopsy was performed the same day.  It was noted at the 12 o'clock position, deep in the breast was a focal hypoechoic mass, at least 3.5 cm in diameter.  Biopsy did in fact show invasive in situ mammary carcinoma. This was felt to be both at least intermediate, high grade.  No definite lymphovascular invasion was identified. ER and PR, Her2 testing is pending.  [name] continues to have pain in her breast.  She continues to take hydrocodone a number of times a day.  She has been seen by Dr. Rosana Hoes, who felt that neoadjuvant chemotherapy would be required.    Initial staging studies showed evidence of liver and lung mets.   Patient also has evidence of bone lesions. Patient started neoadjuvant chemotherapy, Taxotere/Carbo/Herceptin in October 2004.   Patient had a CT scan in December 2004 which demonstrated extensive clot in the SVC innominate vein, bilateral  jugular vein and  She was started on anticoagulation therapy. She received a total of 6 cycles of Taxotere/Carbo/Herceptin, completed in April 2005."  Patient has been on  trastuzumab continued indefinitely; has also received lapatinib and capecitabine for variable intervals in 2007-2008. Most recent echo 12/01/2013 showed an ejection fraction of 55%. She is status post bilateral mastectomies with bilateral axillary lymph node dissection 12/07/2004, showing (a) on the right, a mypT1c ypN1 invasive ductal carcinoma, grade 3, estrogen and progesterone receptor negative, HER-2 positive, with an MIB-1 of 31% (b) on the left, ypT2 ypN1 invasive ductal carcinoma, grade 2, estrogen and progesterone receptor negative, HER-2 positive, with an MIB-1 of 35%. She is Status post radiation June through July of 2006, to the right chest wall, left chest wall, bilateral supraclavicular fossae, and bilateral axillary boosts; with additional radiation to the right and left chest walls and the central chest wall completed November of 2007. She is status post ixempra x9 completed August of 2009. 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 Patient has chronic pain, with negative PET scan 08/24/2013 (no evidence of active cancer). On Neurontin and Tramadol therapy.  INTERVAL HISTORY: [name] returns today accompanied by her husband, [name], for follow up of her metastatic breast cancer. No new symptoms voiced. 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  REVIEW OF SYSTEMS: [name] has chronic pain problems which relate chiefly to her neck and shoulders but also to her knees and hips. The pain is not more intense or persistent that before. For a long time she had been on narcotics, but she successfully went off the narcotics last year and has been  on her current pain medicine since. The problem is she really feels that tramadol is not quite doing it. Aside from the pain issue, which is very stable as noted, just some shortness of breath when walking up stairs and she does not think she can walk 200 feet. She continues to feel anxious and when she's not anxious she is irritable. She is excited though because her son got married this year. Aside from this a detailed review systems today was stable  PAST MEDICAL HISTORY: Past Medical History  Diagnosis Date  . Breast cancer     mets to liver and lung  . Hypertension   . SVC syndrome   . History of chemotherapy Feb. 2006    taxotere/herceptin/carboplatin  . Radiation 07/31/2006    left upper chest  . Radiation 06/17/2006-06/27/2006    6480 cGy bilat. chest wall  . Neuropathy   . Thrombosis   . Breast cancer metastasized to multiple sites 02/26/2013    PAST SURGICAL HISTORY: Past Surgical History  Procedure Laterality Date  . Tubal ligation  1986  . Cholecystectomy  1989  . Mastectomy Bilateral   . Ankle surgery    . Peripherally inserted central catheter insertion    . Back surgery      FAMILY HISTORY Family History  Problem Relation Age of Onset  . Heart failure Father   . Cancer Father     Prostate cancer  . Heart failure Brother   . Cancer Brother     Prostate cancer  . Diabetes Maternal Aunt    She had three brothers, one died of gunshot wound, one of complications of diabetes mellitus and one of myocardial infarction.  She has no sisters.  Mother died of complications of brain metastasis in 58.  Father has had a myocardial infarction in 1999.  No history of breast or ovarian cancer in the family.     GYNECOLOGIC HISTORY:   Menarche at age 34.  Gravida 3, para 3.  First live birth at age 89.  No history of breast feeding. No history of hormonal replacement therapy.   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 Her husband used to work as a Art gallery manager, but is now retired. She has three children, Monette who lives in [place] and works as a Hydrographic surveyor, Financial risk analyst who lives in [place] and works as a Administrator, and Northwood who lives in [place] and also works as a Hydrographic surveyor. The patient has 12 grandchildren and 4 great-grandchildren. She attends a [employer]. Very involved with school kids.  ADVANCED DIRECTIVES:  Not in place  HEALTH MAINTENANCE: (Updated 06/12/2013) History  Substance Use Topics  . Smoking status: Never Smoker   . Smokeless tobacco: Never Used  . Alcohol Use: Yes     Comment: occasional     Colonoscopy: Never and "I don't want one"  PAP:  1987  Bone density:  Never  Lipid panel:  Not on file    Allergies  Allergen Reactions  . Adhesive [Tape] Other (See Comments)    Tears skin   .  Penicillins Hives    Current Outpatient Prescriptions  Medication Sig Dispense Refill  . acetaminophen (TYLENOL) 500 MG tablet Take 500 mg by mouth every 6 (six) hours as needed for pain or fever.      Marland Kitchen albuterol (PROVENTIL HFA;VENTOLIN HFA) 108 (90 BASE) MCG/ACT inhaler Inhale 2 puffs into the lungs every 6 (six) hours as needed for wheezing.  1 Inhaler  3  . ALPRAZolam (XANAX) 1 MG tablet Take 1 tablet (1 mg total) by mouth 3 (three) times daily as needed for anxiety.  90 tablet  1  . amLODipine (NORVASC) 10 MG tablet Take 10 mg by mouth every morning.      . baclofen (LIORESAL) 10 MG tablet       . diclofenac sodium (VOLTAREN) 1 % GEL Apply 2 g topically daily as needed (for pain). Apply to knees and shoulders  100 g  6  . furosemide (LASIX) 80 MG tablet Take 80 mg by mouth.      . gabapentin (NEURONTIN) 300 MG capsule 600 mg 2 (two) times daily. TAKE 2 CAPSULES BY MOUTH TWO TIMES DAILY      . losartan (COZAAR) 100 MG tablet Take 100 mg by mouth every morning.      . metoprolol succinate (TOPROL-XL) 50 MG 24 hr tablet Take 50 mg by mouth daily. Take with or  immediately following a meal.      . potassium chloride (MICRO-K) 10 MEQ CR capsule TAKE ONE CAPSULE BY MOUTH TWICE DAILY  180 capsule  1  . prochlorperazine (COMPAZINE) 10 MG tablet Take 1 tablet (10 mg total) by mouth every 6 (six) hours as needed (for nausea).  30 tablet  3  . spironolactone (ALDACTONE) 25 MG tablet Take 0.5 tablets (12.5 mg total) by mouth daily.  30 tablet  3  . temazepam (RESTORIL) 30 MG capsule       . traMADol (ULTRAM) 50 MG tablet Take 2 tablets (100 mg total) by mouth every 6 (six) hours as needed.  120 tablet  1  . warfarin (COUMADIN) 2.5 MG tablet TAKE 1 TABLET BY MOUTH EVERY DAY  30 tablet  2  . warfarin (COUMADIN) 5 MG tablet On Tuesdays and Fridays take two 5 mg tablets to make 10 mg. Take one 2.5 mg tablet along with a 32m to make 7.5 mg all other days  90 tablet  1  . zolpidem (AMBIEN) 10 MG tablet Take 10 mg by mouth at bedtime as needed for sleep.       No current facility-administered medications for this visit.   Facility-Administered Medications Ordered in Other Visits  Medication Dose Route Frequency Provider Last Rate Last Dose  . sodium chloride 0.9 % injection 10 mL  10 mL Intravenous PRN GChauncey Cruel MD   10 mL at 12/17/13 1103    OBJECTIVE:  Middle aged African American woman who appears stated age F7Vitals:   04/01/14 0942  BP: 143/88  Pulse: 64  Temp: 98.7 F (37.1 C)  Resp: 18     Body mass index is 51.56 kg/(m^2).    ECOG FS: 1 Filed Weights   04/01/14 0942  Weight: 291 lb (131.997 kg)   Filed Weights   04/01/14 0942  Weight: 291 lb (131.997 kg)   Sclerae unicteric, pupils equal and reactive Oropharynx clear and moist No cervical or supraclavicular adenopathy Lungs no rales or rhonchi Heart regular rate and rhythm Abd soft, obese, nontender, positive bowel sounds MSK no focal spinal tenderness, no  upper extremity lymphedema, poor range of motion in both upper extremities Neuro: nonfocal, well oriented, appropriate  affect Breasts: Status post bilateral mastectomies. The incisions have healed and a very irregular pattern, but there is no evidence of local recurrence. Both axillae are benign.    LAB RESULTS: Lab Results  Component Value Date   WBC 11.1* 04/01/2014   NEUTROABS 6.8* 04/01/2014   HGB 11.9 04/01/2014   HCT 35.7 04/01/2014   MCV 80.1 04/01/2014   PLT 265 04/01/2014      Chemistry      Component Value Date/Time   NA 141 04/01/2014 0923   NA 138 03/21/2013 0658   K 4.1 04/01/2014 0923   K 3.6 03/21/2013 0658   CL 103 03/21/2013 0658   CL 104 01/30/2013 0850   CO2 26 04/01/2014 0923   CO2 27 03/21/2013 0658   BUN 17.2 04/01/2014 0923   BUN 11 03/21/2013 0658   CREATININE 0.9 04/01/2014 0923   CREATININE 0.82 03/21/2013 0658      Component Value Date/Time   CALCIUM 9.9 04/01/2014 0923   CALCIUM 9.5 03/21/2013 0658   ALKPHOS 95 04/01/2014 0923   ALKPHOS 87 04/11/2012 0837   AST 16 04/01/2014 0923   AST 19 04/11/2012 0837   ALT 14 04/01/2014 0923   ALT 14 04/11/2012 0837   BILITOT 0.73 04/01/2014 0923   BILITOT 0.8 04/11/2012 0837       ASSESSMENT: 63 y.o.  [name], [place], woman  (1)  with a history of inflammatory right breast cancer metastatic at presentation September 2004 with involvement of liver and bone, HER-2 positive, estrogen and progesterone receptor negative  (2) treated with carboplatin, docetaxel and Herceptin x6 completed April 2005  (3) trastuzumab continued indefinitely; has also received lapatinib and capecitabine for variable intervals in 2007-2008. Most recent echo 12/01/2013 showed an ejection fraction of 55%.  (4) status post bilateral mastectomies with bilateral axillary lymph node dissection 12/07/2004, showing  (a) on the right, a mypT1c ypN1 invasive ductal carcinoma, grade 3, estrogen and progesterone receptor negative, HER-2 positive, with an MIB-1 of 31%  (b) on the left, ypT2 ypN1 invasive ductal carcinoma, grade 2, estrogen and progesterone receptor negative, HER-2  positive, with an MIB-1 of 35%.  (5)  Status post radiation June through July of 2006, to the right chest wall, left chest wall, bilateral supraclavicular fossae, and bilateral axillary boosts; with additional radiation to the right and left chest walls and the central chest wall completed November of 2007  (6) status post ixempra x9 completed August of 2009.  (7) history of superior vena caval syndrome, on life long anticoagulation   (8)  History of chemotherapy-induced neuropathy.   (9)  chronic pain, with negative PET scan 08/24/2013 (no evidence of active cancer). On Neurontin and Tramadol   PLAN:  1. We will continue herceptin indefinitely as long as it is working.  2. Patient getting a PET SCAN on 06/01/14 and then a followup with Dr Jana Hakim.  3. Her echo was fine in July 2015.  4. Her next herceptin therapies are 04/22/14, 05/13/14, 06/03/14 and so forth.  5.We also manage her coumadin and adjust it based on her INR.  6. Patient is doing well and continue to show a good spirit and positive attitude.    Bernadene Bell, MD Medical Hematologist/Oncologist [place] Pager: [phone] Office No: [phone]   04/01/2014  10:27 AM

## 2014-04-21 ENCOUNTER — Other Ambulatory Visit: Payer: Self-pay | Admitting: *Deleted

## 2014-04-21 DIAGNOSIS — C50919 Malignant neoplasm of unspecified site of unspecified female breast: Secondary | ICD-10-CM

## 2014-04-22 ENCOUNTER — Encounter: Payer: Self-pay | Admitting: Hematology

## 2014-04-22 ENCOUNTER — Ambulatory Visit (HOSPITAL_BASED_OUTPATIENT_CLINIC_OR_DEPARTMENT_OTHER): Payer: Commercial Managed Care - HMO | Admitting: Hematology

## 2014-04-22 ENCOUNTER — Ambulatory Visit (HOSPITAL_BASED_OUTPATIENT_CLINIC_OR_DEPARTMENT_OTHER): Payer: Commercial Managed Care - HMO

## 2014-04-22 ENCOUNTER — Other Ambulatory Visit: Payer: Self-pay | Admitting: *Deleted

## 2014-04-22 ENCOUNTER — Other Ambulatory Visit (HOSPITAL_BASED_OUTPATIENT_CLINIC_OR_DEPARTMENT_OTHER): Payer: Commercial Managed Care - HMO

## 2014-04-22 ENCOUNTER — Telehealth: Payer: Self-pay | Admitting: Hematology

## 2014-04-22 ENCOUNTER — Ambulatory Visit (HOSPITAL_BASED_OUTPATIENT_CLINIC_OR_DEPARTMENT_OTHER): Payer: Self-pay | Admitting: Pharmacist

## 2014-04-22 VITALS — BP 140/70 | HR 68 | Temp 98.1°F | Resp 18 | Ht 63.0 in | Wt 290.7 lb

## 2014-04-22 DIAGNOSIS — I82409 Acute embolism and thrombosis of unspecified deep veins of unspecified lower extremity: Secondary | ICD-10-CM

## 2014-04-22 DIAGNOSIS — C50919 Malignant neoplasm of unspecified site of unspecified female breast: Secondary | ICD-10-CM

## 2014-04-22 DIAGNOSIS — C787 Secondary malignant neoplasm of liver and intrahepatic bile duct: Secondary | ICD-10-CM

## 2014-04-22 DIAGNOSIS — Z452 Encounter for adjustment and management of vascular access device: Secondary | ICD-10-CM

## 2014-04-22 DIAGNOSIS — C7952 Secondary malignant neoplasm of bone marrow: Secondary | ICD-10-CM

## 2014-04-22 DIAGNOSIS — Z95828 Presence of other vascular implants and grafts: Secondary | ICD-10-CM

## 2014-04-22 DIAGNOSIS — I871 Compression of vein: Secondary | ICD-10-CM

## 2014-04-22 DIAGNOSIS — C7951 Secondary malignant neoplasm of bone: Secondary | ICD-10-CM

## 2014-04-22 DIAGNOSIS — Z5112 Encounter for antineoplastic immunotherapy: Secondary | ICD-10-CM

## 2014-04-22 DIAGNOSIS — F419 Anxiety disorder, unspecified: Secondary | ICD-10-CM

## 2014-04-22 DIAGNOSIS — Z171 Estrogen receptor negative status [ER-]: Secondary | ICD-10-CM

## 2014-04-22 LAB — COMPREHENSIVE METABOLIC PANEL (CC13)
ALBUMIN: 3.4 g/dL — AB (ref 3.5–5.0)
ALT: 13 U/L (ref 0–55)
ANION GAP: 11 meq/L (ref 3–11)
AST: 16 U/L (ref 5–34)
Alkaline Phosphatase: 93 U/L (ref 40–150)
BUN: 23.7 mg/dL (ref 7.0–26.0)
CALCIUM: 9.4 mg/dL (ref 8.4–10.4)
CHLORIDE: 105 meq/L (ref 98–109)
CO2: 28 meq/L (ref 22–29)
CREATININE: 1.1 mg/dL (ref 0.6–1.1)
Glucose: 106 mg/dl (ref 70–140)
POTASSIUM: 4 meq/L (ref 3.5–5.1)
Sodium: 143 mEq/L (ref 136–145)
Total Bilirubin: 0.62 mg/dL (ref 0.20–1.20)
Total Protein: 8.2 g/dL (ref 6.4–8.3)

## 2014-04-22 LAB — POCT INR: INR: 4.7

## 2014-04-22 LAB — CBC WITH DIFFERENTIAL/PLATELET
BASO%: 0.9 % (ref 0.0–2.0)
Basophils Absolute: 0.1 10*3/uL (ref 0.0–0.1)
EOS ABS: 0.2 10*3/uL (ref 0.0–0.5)
EOS%: 2.7 % (ref 0.0–7.0)
HCT: 35.2 % (ref 34.8–46.6)
HEMOGLOBIN: 11.9 g/dL (ref 11.6–15.9)
LYMPH#: 3.1 10*3/uL (ref 0.9–3.3)
LYMPH%: 39.5 % (ref 14.0–49.7)
MCH: 26.9 pg (ref 25.1–34.0)
MCHC: 33.7 g/dL (ref 31.5–36.0)
MCV: 79.9 fL (ref 79.5–101.0)
MONO#: 0.6 10*3/uL (ref 0.1–0.9)
MONO%: 8.1 % (ref 0.0–14.0)
NEUT#: 3.8 10*3/uL (ref 1.5–6.5)
NEUT%: 48.8 % (ref 38.4–76.8)
Platelets: 247 10*3/uL (ref 145–400)
RBC: 4.41 10*6/uL (ref 3.70–5.45)
RDW: 16.7 % — AB (ref 11.2–14.5)
WBC: 7.8 10*3/uL (ref 3.9–10.3)

## 2014-04-22 LAB — PROTIME-INR
INR: 4.7 — AB (ref 2.00–3.50)
Protime: 56.4 Seconds — ABNORMAL HIGH (ref 10.6–13.4)

## 2014-04-22 MED ORDER — HEPARIN SOD (PORK) LOCK FLUSH 100 UNIT/ML IV SOLN
500.0000 [IU] | Freq: Once | INTRAVENOUS | Status: AC | PRN
  Administered 2014-04-22: 500 [IU]
  Filled 2014-04-22: qty 5

## 2014-04-22 MED ORDER — LORAZEPAM 2 MG/ML IJ SOLN
INTRAMUSCULAR | Status: AC
  Filled 2014-04-22: qty 1

## 2014-04-22 MED ORDER — ALPRAZOLAM 1 MG PO TABS: 1.0000 mg | ORAL_TABLET | Freq: Three times a day (TID) | ORAL | Status: DC | PRN

## 2014-04-22 MED ORDER — LORAZEPAM 2 MG/ML IJ SOLN
1.0000 mg | Freq: Once | INTRAMUSCULAR | Status: AC | PRN
  Administered 2014-04-22: 1 mg via INTRAVENOUS

## 2014-04-22 MED ORDER — DIPHENHYDRAMINE HCL 25 MG PO CAPS
50.0000 mg | ORAL_CAPSULE | Freq: Once | ORAL | Status: AC
  Administered 2014-04-22: 50 mg via ORAL

## 2014-04-22 MED ORDER — TRASTUZUMAB CHEMO INJECTION 440 MG
6.0000 mg/kg | Freq: Once | INTRAVENOUS | Status: AC
  Administered 2014-04-22: 756 mg via INTRAVENOUS
  Filled 2014-04-22: qty 36

## 2014-04-22 MED ORDER — DIPHENHYDRAMINE HCL 25 MG PO CAPS
ORAL_CAPSULE | ORAL | Status: AC
  Filled 2014-04-22: qty 2

## 2014-04-22 MED ORDER — SODIUM CHLORIDE 0.9 % IV SOLN
Freq: Once | INTRAVENOUS | Status: AC
  Administered 2014-04-22: 11:00:00 via INTRAVENOUS

## 2014-04-22 MED ORDER — ACETAMINOPHEN 325 MG PO TABS
650.0000 mg | ORAL_TABLET | Freq: Once | ORAL | Status: AC
  Administered 2014-04-22: 650 mg via ORAL

## 2014-04-22 MED ORDER — SODIUM CHLORIDE 0.9 % IJ SOLN
10.0000 mL | INTRAMUSCULAR | Status: DC | PRN
  Administered 2014-04-22: 10 mL
  Filled 2014-04-22: qty 10

## 2014-04-22 MED ORDER — DIPHENHYDRAMINE HCL 25 MG PO CAPS
ORAL_CAPSULE | ORAL | Status: AC
  Filled 2014-04-22: qty 1

## 2014-04-22 MED ORDER — ZOLPIDEM TARTRATE 10 MG PO TABS: 10.0000 mg | ORAL_TABLET | Freq: Every evening | ORAL | Status: DC | PRN

## 2014-04-22 MED ORDER — SODIUM CHLORIDE 0.9 % IJ SOLN
10.0000 mL | INTRAMUSCULAR | Status: DC | PRN
  Administered 2014-04-22: 10 mL via INTRAVENOUS
  Filled 2014-04-22: qty 10

## 2014-04-22 MED ORDER — ACETAMINOPHEN 325 MG PO TABS
ORAL_TABLET | ORAL | Status: AC
Start: 2014-04-22 — End: 2014-04-22
  Filled 2014-04-22: qty 2

## 2014-04-22 NOTE — Patient Instructions (Signed)

## 2014-04-22 NOTE — Patient Instructions (Signed)
[facility] Discharge Instructions for Patients Receiving Chemotherapy  Today you received the following chemotherapy agents Herceptin  To help prevent nausea and vomiting after your treatment, we encourage you to take your nausea medication     If you develop nausea and vomiting that is not controlled by your nausea medication, call the clinic.   BELOW ARE SYMPTOMS THAT SHOULD BE REPORTED IMMEDIATELY:  *FEVER GREATER THAN 100.5 F  *CHILLS WITH OR WITHOUT FEVER  NAUSEA AND VOMITING THAT IS NOT CONTROLLED WITH YOUR NAUSEA MEDICATION  *UNUSUAL SHORTNESS OF BREATH  *UNUSUAL BRUISING OR BLEEDING  TENDERNESS IN MOUTH AND THROAT WITH OR WITHOUT PRESENCE OF ULCERS  *URINARY PROBLEMS  *BOWEL PROBLEMS  UNUSUAL RASH Items with * indicate a potential emergency and should be followed up as soon as possible.  Feel free to call the clinic you have any questions or concerns. The clinic phone number is [phone].    

## 2014-04-22 NOTE — Progress Notes (Signed)
[place]  [phone] [phone]  OFFICE PROGRESS NOTE  ID: SHIRL WEIR   DOB: [birth year]  MR#: [id number]  YHC#:[id number]  PCP: Ernestene Kiel, MD GYN:  SU:  OTHER MD: Pierre Bali, Gracelyn Nurse  CC: Patient for follow-up of her 2 positive breast cancer.   HISTORY OF PRESENT ILLNESS:    Beryl Hornberger is 63 years old [name] female.  This woman has been in good health all of her life.  She noted a swelling and discomfort in her right breast in June 2004.  She was seen in the Emergency Room in [place] and was treated for mastitis.  She was treated for a number of months with mastitis and the swelling did not get better. She was given hydrocodone and Cipro.  Finally, the swelling did get better and ultimately the nipple became retracted and she noticed some dimpling in her skin. She had a mammogram in July of 2004 in [place] with subsequent mammogram on May 27, 2003, by Dr. Isaiah Blakes.  Mammogram done on September 30 showed marked increased density in the left breast.  Biopsy was performed the same day.  It was noted at the 12 o'clock position, deep in the breast was a focal hypoechoic mass, at least 3.5 cm in diameter.  Biopsy did in fact show invasive in situ mammary carcinoma. This was felt to be both at least intermediate, high grade.  No definite lymphovascular invasion was identified. ER and PR negative, Her2 testing positive. Nicolasa continues to have pain in her breast.  She continues to take hydrocodone a number of times a day.  She has been seen by Dr. Rosana Hoes, who felt that neoadjuvant chemotherapy would be required.    Initial staging studies showed evidence of liver and lung mets.   Patient also has evidence of bone lesions. Patient started neoadjuvant chemotherapy, Taxotere/Carbo/Herceptin in October 2004.   Patient had a CT scan in December 2004 which demonstrated extensive clot in the SVC innominate vein,  bilateral jugular vein and  She was started on anticoagulation therapy. She received a total of 6 cycles of Taxotere/Carbo/Herceptin, completed in April 2005."  Patient has been on  trastuzumab continued indefinitely; has also received lapatinib and capecitabine for variable intervals in 2007-2008. Most recent echo 12/01/2013 showed an ejection fraction of 55%. She is status post bilateral mastectomies with bilateral axillary lymph node dissection 12/07/2004, showing (a) on the right, a mypT1c ypN1 invasive ductal carcinoma, grade 3, estrogen and progesterone receptor negative, HER-2 positive, with an MIB-1 of 31% (b) on the left, ypT2 ypN1 invasive ductal carcinoma, grade 2, estrogen and progesterone receptor negative, HER-2 positive, with an MIB-1 of 35%. She is Status post radiation June through July of 2006, to the right chest wall, left chest wall, bilateral supraclavicular fossae, and bilateral axillary boosts; with additional radiation to the right and left chest walls and the central chest wall completed November of 2007. She is status post ixempra x9 completed August of 2009. 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 Patient has chronic pain, with negative PET scan 08/24/2013 (no evidence of active cancer). On Neurontin and Tramadol therapy.  INTERVAL HISTORY: Declan returns today accompanied by her husband, Abe People, for follow up of her metastatic breast cancer. She has noticed some intermittent headaches. They affect the right side, start in back of neck and scalp, it is a migraine variant, she closes her eyes and it happens  once or twice a week. Tylenol helps it. There is no trouble with vision, facial numbness or drooping or trouble swallowing. No seizures or confusion. Headache is not pronounced in am and not associated with projectile vomiting. i would categorize it as a functional headache but if it gets worse,  more frequent or accompanied by other signs and symptoms, a neuroimaging may be needed.  REVIEW OF SYSTEMS: Aleathia has chronic pain problems which relate chiefly to her neck and shoulders but also to her knees and hips. The pain is not more intense or persistent that before. For a long time she had been on narcotics, but she successfully went off the narcotics last year and has been on her current pain medicine since. The problem is she really feels that tramadol is not quite doing it. Aside from the pain issue, which is very stable as noted, just some shortness of breath when walking up stairs and she does not think she can walk 200 feet. She continues to feel anxious and when she's not anxious she is irritable. Aside from this a detailed review systems today was stable  PAST MEDICAL HISTORY: Past Medical History  Diagnosis Date  . Breast cancer     mets to liver and lung  . Hypertension   . SVC syndrome   . History of chemotherapy Feb. 2006    taxotere/herceptin/carboplatin  . Radiation 07/31/2006    left upper chest  . Radiation 06/17/2006-06/27/2006    6480 cGy bilat. chest wall  . Neuropathy   . Thrombosis   . Breast cancer metastasized to multiple sites 02/26/2013    PAST SURGICAL HISTORY: Past Surgical History  Procedure Laterality Date  . Tubal ligation  1986  . Cholecystectomy  1989  . Mastectomy Bilateral   . Ankle surgery    . Peripherally inserted central catheter insertion    . Back surgery      FAMILY HISTORY Family History  Problem Relation Age of Onset  . Heart failure Father   . Cancer Father     Prostate cancer  . Heart failure Brother   . Cancer Brother     Prostate cancer  . Diabetes Maternal Aunt    She had three brothers, one died of gunshot wound, one of complications of diabetes mellitus and one of myocardial infarction.  She has no sisters.  Mother died of complications of brain metastasis in 1994.  Father has had a myocardial infarction in 1999.  No  history of breast or ovarian cancer in the family.     GYNECOLOGIC HISTORY:   Menarche at age 12.  Gravida 3, para 3.  First live birth at age 23.  No history of breast feeding. No history of hormonal replacement therapy.   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 Her husband used to work as a receiving clerk, but is now retired. She has three children, Monette who lives in [place] and works as a prison guard, Brandon who lives in [place] and works as a truck driver, and Bradley who lives in [place] and also works as a prison guard. The patient has 12 grandchildren and 4 great-grandchildren. She attends a local [employer]. Very involved with school kids.  ADVANCED DIRECTIVES:  Not in place  HEALTH MAINTENANCE: (Updated 06/12/2013) History  Substance Use Topics  . Smoking status: Never Smoker   . Smokeless tobacco: Never Used  . Alcohol Use: Yes       Comment: occasional     Colonoscopy: Never and "I don't want one"  PAP:  1987  Bone density:  Never  Lipid panel:  Not on file    Allergies  Allergen Reactions  . Adhesive [Tape] Other (See Comments)    Tears skin   . Penicillins Hives    Current Outpatient Prescriptions  Medication Sig Dispense Refill  . acetaminophen (TYLENOL) 500 MG tablet Take 500 mg by mouth every 6 (six) hours as needed for pain or fever.      . albuterol (PROVENTIL HFA;VENTOLIN HFA) 108 (90 BASE) MCG/ACT inhaler Inhale 2 puffs into the lungs every 6 (six) hours as needed for wheezing.  1 Inhaler  3  . amLODipine (NORVASC) 10 MG tablet Take 10 mg by mouth every morning.      . diclofenac sodium (VOLTAREN) 1 % GEL Apply 2 g topically daily as needed (for pain). Apply to knees and shoulders  100 g  6  . furosemide (LASIX) 80 MG tablet Take 80 mg by mouth.      . gabapentin (NEURONTIN) 300 MG capsule 600 mg 2 (two) times daily. TAKE 2 CAPSULES BY MOUTH TWO TIMES DAILY      . losartan  (COZAAR) 100 MG tablet Take 100 mg by mouth every morning.      . potassium chloride (MICRO-K) 10 MEQ CR capsule TAKE ONE CAPSULE BY MOUTH TWICE DAILY  180 capsule  1  . prochlorperazine (COMPAZINE) 10 MG tablet Take 1 tablet (10 mg total) by mouth every 6 (six) hours as needed (for nausea).  30 tablet  3  . spironolactone (ALDACTONE) 25 MG tablet Take 0.5 tablets (12.5 mg total) by mouth daily.  30 tablet  3  . temazepam (RESTORIL) 30 MG capsule       . traMADol (ULTRAM) 50 MG tablet Take 2 tablets (100 mg total) by mouth every 6 (six) hours as needed.  120 tablet  1  . warfarin (COUMADIN) 2.5 MG tablet TAKE 1 TABLET BY MOUTH EVERY DAY  30 tablet  2  . warfarin (COUMADIN) 5 MG tablet On Tuesdays and Fridays take two 5 mg tablets to make 10 mg. Take one 2.5 mg tablet along with a 5mg to make 7.5 mg all other days  90 tablet  1  . ALPRAZolam (XANAX) 1 MG tablet Take 1 tablet (1 mg total) by mouth 3 (three) times daily as needed for anxiety.  90 tablet  1  . baclofen (LIORESAL) 10 MG tablet       . metoprolol succinate (TOPROL-XL) 50 MG 24 hr tablet Take 50 mg by mouth daily. Take with or immediately following a meal.      . zolpidem (AMBIEN) 10 MG tablet Take 1 tablet (10 mg total) by mouth at bedtime as needed for sleep.  30 tablet  0   No current facility-administered medications for this visit.   Facility-Administered Medications Ordered in Other Visits  Medication Dose Route Frequency Provider Last Rate Last Dose  . sodium chloride 0.9 % injection 10 mL  10 mL Intravenous PRN Gustav C Magrinat, MD   10 mL at 12/17/13 1103  . sodium chloride 0.9 % injection 10 mL  10 mL Intracatheter PRN Gustav C Magrinat, MD   10 mL at 04/22/14 1203    OBJECTIVE:  Middle aged African American woman who appears stated age Filed Vitals:   04/22/14 1008  BP: 140/70  Pulse: 68  Temp: 98.1 F (36.7 C)  Resp: 18       Body mass index is 51.51 kg/(m^2).    ECOG FS: 1 Filed Weights   04/22/14 1008  Weight:  290 lb 11.2 oz (131.861 kg)   Filed Weights   04/22/14 1008  Weight: 290 lb 11.2 oz (131.861 kg)   HEENT: [place]/AT, No scalp tenderness noted. Sclerae unicteric, pupils equal and reactive Oropharynx clear and moist No cervical or supraclavicular adenopathy Lungs no rales or rhonchi Heart regular rate and rhythm Abd soft, obese, nontender, positive bowel sounds MSK no focal spinal tenderness, no upper extremity lymphedema, poor range of motion in both upper extremities Neuro: nonfocal, well oriented, appropriate affect. Muscle strength 5/5, sensory intact Breasts: Deferred.    LAB RESULTS: Lab Results  Component Value Date   WBC 7.8 04/22/2014   NEUTROABS 3.8 04/22/2014   HGB 11.9 04/22/2014   HCT 35.2 04/22/2014   MCV 79.9 04/22/2014   PLT 247 04/22/2014      Chemistry      Component Value Date/Time   NA 143 04/22/2014 0910   NA 138 03/21/2013 0658   K 4.0 04/22/2014 0910   K 3.6 03/21/2013 0658   CL 103 03/21/2013 0658   CL 104 01/30/2013 0850   CO2 28 04/22/2014 0910   CO2 27 03/21/2013 0658   BUN 23.7 04/22/2014 0910   BUN 11 03/21/2013 0658   CREATININE 1.1 04/22/2014 0910   CREATININE 0.82 03/21/2013 0658      Component Value Date/Time   CALCIUM 9.4 04/22/2014 0910   CALCIUM 9.5 03/21/2013 0658   ALKPHOS 93 04/22/2014 0910   ALKPHOS 87 04/11/2012 0837   AST 16 04/22/2014 0910   AST 19 04/11/2012 0837   ALT 13 04/22/2014 0910   ALT 14 04/11/2012 0837   BILITOT 0.62 04/22/2014 0910   BILITOT 0.8 04/11/2012 0837       ASSESSMENT: 63 y.o.  [place], [place], woman  (1)  with a history of inflammatory right breast cancer metastatic at presentation September 2004 with involvement of liver and bone, HER-2 positive, estrogen and progesterone receptor negative  (2) treated with carboplatin, docetaxel and Herceptin x 6 completed April 2005  (3) trastuzumab continued indefinitely; has also received lapatinib and capecitabine for variable intervals in 2007-2008. Most recent echo  12/01/2013 showed an ejection fraction of 55%.  (4) status post bilateral mastectomies with bilateral axillary lymph node dissection 12/07/2004, showing  (a) on the right, a mypT1c ypN1 invasive ductal carcinoma, grade 3, estrogen and progesterone receptor negative, HER-2 positive, with an MIB-1 of 31%  (b) on the left, ypT2 ypN1 invasive ductal carcinoma, grade 2, estrogen and progesterone receptor negative, HER-2 positive, with an MIB-1 of 35%.  (5)  Status post radiation June through July of 2006, to the right chest wall, left chest wall, bilateral supraclavicular fossae, and bilateral axillary boosts; with additional radiation to the right and left chest walls and the central chest wall completed November of 2007  (6) status post Ixempra x9 completed August of 2009.  (7) history of superior vena caval syndrome, on life long anticoagulation   (8)  History of chemotherapy-induced neuropathy.   (9)  chronic pain, with negative PET scan 08/24/2013 (no evidence of active cancer). On Neurontin and Tramadol   PLAN:  1. We will continue herceptin indefinitely as long as it is working.  2. Patient getting a PET SCAN on 06/01/14 and then a followup with Dr Magrinat.  3. Her echo was fine in July 2015.  4. Her next herceptin therapies are 05/13/14, 06/03/14 and   10/29 and thereafter Q 3 weeks.  5.We also manage her coumadin and adjust it based on her INR.  6. Patient is doing well and continues to show a good spirit and positive attitude.    Aasim Sehbai, MD Medical Hematologist/Oncologist [facility] Pager: [phone] Office No: [phone]   04/22/2014  2:28 PM        

## 2014-04-22 NOTE — Telephone Encounter (Signed)
per pof to sch pt CC per [place]-pt aware

## 2014-04-22 NOTE — Progress Notes (Signed)
INR = 4.7 on Coumadin 7.5 mg daily except 5 mg on Fridays. Pt has no bleeding/bruising. She had cabbage last night; her husband says it was "a lot." She has enjoyed vegetables from her garden - mainly squash now. No recent med changes.  She is not on abx or steroids.  She has not taken extra doses of Coumadin by mistake. INR elevated.  She will hold Coumadin for 2 days then restart her maintenance dose which has kept her at goal. Repeat INR in 3 weeks (pt preference; she did not want to recheck earlier).  We'll see her in treatment room in 3 weeks. Pt understands she should call if she develops bleeding sxs. Kennith Center, Pharm.D., CPP 04/22/2014@11 :17 AM

## 2014-04-28 ENCOUNTER — Telehealth: Payer: Self-pay | Admitting: Oncology

## 2014-04-28 NOTE — Telephone Encounter (Signed)
Lvm advising chg to 9/17 appt from schd with Sebai to schd with [place] @ 8.45am.

## 2014-05-06 ENCOUNTER — Other Ambulatory Visit: Payer: Self-pay | Admitting: *Deleted

## 2014-05-06 DIAGNOSIS — C50919 Malignant neoplasm of unspecified site of unspecified female breast: Secondary | ICD-10-CM

## 2014-05-06 MED ORDER — TRAMADOL HCL 50 MG PO TABS: 100.0000 mg | ORAL_TABLET | Freq: Four times a day (QID) | ORAL | Status: DC | PRN

## 2014-05-12 ENCOUNTER — Other Ambulatory Visit: Payer: Self-pay | Admitting: *Deleted

## 2014-05-12 DIAGNOSIS — C50919 Malignant neoplasm of unspecified site of unspecified female breast: Secondary | ICD-10-CM

## 2014-05-13 ENCOUNTER — Ambulatory Visit (HOSPITAL_BASED_OUTPATIENT_CLINIC_OR_DEPARTMENT_OTHER): Payer: Commercial Managed Care - HMO | Admitting: Adult Health

## 2014-05-13 ENCOUNTER — Telehealth: Payer: Self-pay | Admitting: Adult Health

## 2014-05-13 ENCOUNTER — Ambulatory Visit (HOSPITAL_BASED_OUTPATIENT_CLINIC_OR_DEPARTMENT_OTHER): Payer: Commercial Managed Care - HMO

## 2014-05-13 ENCOUNTER — Other Ambulatory Visit (HOSPITAL_BASED_OUTPATIENT_CLINIC_OR_DEPARTMENT_OTHER): Payer: Commercial Managed Care - HMO

## 2014-05-13 ENCOUNTER — Ambulatory Visit: Payer: Commercial Managed Care - HMO

## 2014-05-13 ENCOUNTER — Encounter: Payer: Self-pay | Admitting: Adult Health

## 2014-05-13 ENCOUNTER — Other Ambulatory Visit: Payer: Commercial Managed Care - HMO

## 2014-05-13 VITALS — BP 158/73 | HR 72 | Temp 98.3°F | Resp 20 | Ht 63.0 in | Wt 289.5 lb

## 2014-05-13 DIAGNOSIS — C50919 Malignant neoplasm of unspecified site of unspecified female breast: Secondary | ICD-10-CM

## 2014-05-13 DIAGNOSIS — Z5112 Encounter for antineoplastic immunotherapy: Secondary | ICD-10-CM

## 2014-05-13 DIAGNOSIS — C7800 Secondary malignant neoplasm of unspecified lung: Secondary | ICD-10-CM

## 2014-05-13 DIAGNOSIS — C787 Secondary malignant neoplasm of liver and intrahepatic bile duct: Secondary | ICD-10-CM

## 2014-05-13 DIAGNOSIS — I871 Compression of vein: Secondary | ICD-10-CM

## 2014-05-13 DIAGNOSIS — C7951 Secondary malignant neoplasm of bone: Secondary | ICD-10-CM

## 2014-05-13 DIAGNOSIS — C800 Disseminated malignant neoplasm, unspecified: Secondary | ICD-10-CM

## 2014-05-13 DIAGNOSIS — C7952 Secondary malignant neoplasm of bone marrow: Secondary | ICD-10-CM

## 2014-05-13 DIAGNOSIS — M542 Cervicalgia: Secondary | ICD-10-CM

## 2014-05-13 DIAGNOSIS — Z95828 Presence of other vascular implants and grafts: Secondary | ICD-10-CM

## 2014-05-13 LAB — COMPREHENSIVE METABOLIC PANEL (CC13)
ALK PHOS: 88 U/L (ref 40–150)
ALT: 8 U/L (ref 0–55)
AST: 11 U/L (ref 5–34)
Albumin: 3.6 g/dL (ref 3.5–5.0)
Anion Gap: 12 mEq/L — ABNORMAL HIGH (ref 3–11)
BILIRUBIN TOTAL: 0.62 mg/dL (ref 0.20–1.20)
BUN: 18 mg/dL (ref 7.0–26.0)
CO2: 21 mEq/L — ABNORMAL LOW (ref 22–29)
Calcium: 9.6 mg/dL (ref 8.4–10.4)
Chloride: 108 mEq/L (ref 98–109)
Creatinine: 1 mg/dL (ref 0.6–1.1)
GLUCOSE: 108 mg/dL (ref 70–140)
Potassium: 4 mEq/L (ref 3.5–5.1)
SODIUM: 140 meq/L (ref 136–145)
TOTAL PROTEIN: 8.5 g/dL — AB (ref 6.4–8.3)

## 2014-05-13 LAB — CBC WITH DIFFERENTIAL/PLATELET
BASO%: 0.6 % (ref 0.0–2.0)
Basophils Absolute: 0.1 10*3/uL (ref 0.0–0.1)
EOS ABS: 0.2 10*3/uL (ref 0.0–0.5)
EOS%: 2.1 % (ref 0.0–7.0)
HCT: 36.4 % (ref 34.8–46.6)
HGB: 12.1 g/dL (ref 11.6–15.9)
LYMPH%: 31.8 % (ref 14.0–49.7)
MCH: 27.2 pg (ref 25.1–34.0)
MCHC: 33.3 g/dL (ref 31.5–36.0)
MCV: 81.5 fL (ref 79.5–101.0)
MONO#: 0.6 10*3/uL (ref 0.1–0.9)
MONO%: 6.2 % (ref 0.0–14.0)
NEUT%: 59.3 % (ref 38.4–76.8)
NEUTROS ABS: 5.7 10*3/uL (ref 1.5–6.5)
PLATELETS: 277 10*3/uL (ref 145–400)
RBC: 4.46 10*6/uL (ref 3.70–5.45)
RDW: 16.3 % — AB (ref 11.2–14.5)
WBC: 9.5 10*3/uL (ref 3.9–10.3)
lymph#: 3 10*3/uL (ref 0.9–3.3)

## 2014-05-13 LAB — POCT INR: INR: 2

## 2014-05-13 LAB — PROTIME-INR
INR: 2 (ref 2.00–3.50)
Protime: 24 Seconds — ABNORMAL HIGH (ref 10.6–13.4)

## 2014-05-13 MED ORDER — LORAZEPAM 2 MG/ML IJ SOLN
INTRAMUSCULAR | Status: AC
  Filled 2014-05-13: qty 1

## 2014-05-13 MED ORDER — HEPARIN SOD (PORK) LOCK FLUSH 100 UNIT/ML IV SOLN
500.0000 [IU] | Freq: Once | INTRAVENOUS | Status: DC
  Administered 2014-05-13: 500 [IU] via INTRAVENOUS
  Filled 2014-05-13: qty 5

## 2014-05-13 MED ORDER — SODIUM CHLORIDE 0.9 % IV SOLN
Freq: Once | INTRAVENOUS | Status: AC
  Administered 2014-05-13: 10:00:00 via INTRAVENOUS

## 2014-05-13 MED ORDER — SODIUM CHLORIDE 0.9 % IJ SOLN
10.0000 mL | INTRAMUSCULAR | Status: DC | PRN
  Administered 2014-05-13: 10 mL
  Filled 2014-05-13: qty 10

## 2014-05-13 MED ORDER — LORAZEPAM 2 MG/ML IJ SOLN
1.0000 mg | Freq: Once | INTRAMUSCULAR | Status: AC | PRN
  Administered 2014-05-13: 1 mg via INTRAVENOUS

## 2014-05-13 MED ORDER — HEPARIN SOD (PORK) LOCK FLUSH 100 UNIT/ML IV SOLN
500.0000 [IU] | Freq: Once | INTRAVENOUS | Status: DC | PRN
  Filled 2014-05-13: qty 5

## 2014-05-13 MED ORDER — TRASTUZUMAB CHEMO INJECTION 440 MG
6.0000 mg/kg | Freq: Once | INTRAVENOUS | Status: AC
  Administered 2014-05-13: 756 mg via INTRAVENOUS
  Filled 2014-05-13: qty 36

## 2014-05-13 MED ORDER — ACETAMINOPHEN 325 MG PO TABS
ORAL_TABLET | ORAL | Status: AC
  Filled 2014-05-13: qty 2

## 2014-05-13 MED ORDER — SODIUM CHLORIDE 0.9 % IJ SOLN
10.0000 mL | INTRAMUSCULAR | Status: DC | PRN
  Administered 2014-05-13: 10 mL via INTRAVENOUS
  Filled 2014-05-13: qty 10

## 2014-05-13 MED ORDER — ACETAMINOPHEN 325 MG PO TABS
650.0000 mg | ORAL_TABLET | Freq: Once | ORAL | Status: AC
  Administered 2014-05-13: 650 mg via ORAL

## 2014-05-13 MED ORDER — DIPHENHYDRAMINE HCL 25 MG PO CAPS
ORAL_CAPSULE | ORAL | Status: AC
  Filled 2014-05-13: qty 2

## 2014-05-13 MED ORDER — DIPHENHYDRAMINE HCL 25 MG PO CAPS
50.0000 mg | ORAL_CAPSULE | Freq: Once | ORAL | Status: AC
  Administered 2014-05-13: 50 mg via ORAL

## 2014-05-13 NOTE — Patient Instructions (Signed)
[facility] Discharge Instructions for Patients Receiving Chemotherapy  Today you received the following chemotherapy agents Herceptin.  To help prevent nausea and vomiting after your treatment, we encourage you to take your nausea medication as directed.   If you develop nausea and vomiting that is not controlled by your nausea medication, call the clinic.   BELOW ARE SYMPTOMS THAT SHOULD BE REPORTED IMMEDIATELY:  *FEVER GREATER THAN 100.5 F  *CHILLS WITH OR WITHOUT FEVER  NAUSEA AND VOMITING THAT IS NOT CONTROLLED WITH YOUR NAUSEA MEDICATION  *UNUSUAL SHORTNESS OF BREATH  *UNUSUAL BRUISING OR BLEEDING  TENDERNESS IN MOUTH AND THROAT WITH OR WITHOUT PRESENCE OF ULCERS  *URINARY PROBLEMS  *BOWEL PROBLEMS  UNUSUAL RASH Items with * indicate a potential emergency and should be followed up as soon as possible.  Feel free to call the clinic you have any questions or concerns. The clinic phone number is [phone].  

## 2014-05-13 NOTE — Progress Notes (Signed)
*  Ms. Fuerst was seen in the infusion area.* *No charge for this encounter*  Ms. Coffie INR is 2.0 today, which is within goal range of 2-3. She currently takes 7.5mg  daily except 5mg  on Fridays.  No medication changes since her previous visit. No issues with her Coumadin; no extra and/or missed doses. Her diet has changed as she is a "seasonal eater" and eats the vegetables that are in season that she grows in her garden. Since her last visit, she has eaten "a whole lot of cabbage" which could explain the significant downward trend in her INR.  Ms. Tashiro anticipates a similar amount of green intakes over the next few weeks, though it will be collard greens that will be in season so she will be eating more salads rather than cabbage.  Looking at her INR trend in past visits, she has had labile INRs, and it is seemingly a result of her diet. She denies any bleeding and/or unusual bruising. No signs/symptoms of clots or PE.  Plan: Continue taking 7.5mg  daily except 5mg  on Fridays.  Recheck INR in 3 weeks (patient requests no sooner than 3 weeks) on 06/03/14 with lab at 8:00, flush at 8:15, MD appt at 9:00. Though no infusion appointment is scheduled as of yet, anticipate infusion to be around 10:00 am. We will see her in the treatment room. Ms. Browning knows to call us in the meantime if there are any issues.

## 2014-05-13 NOTE — Telephone Encounter (Signed)
, °

## 2014-05-13 NOTE — Progress Notes (Signed)
[place]  [phone] [phone]  OFFICE PROGRESS NOTE  ID: LAITYN BENSEN   DOB: 1951/02/21  MR#: [id number]  MLY#:[id number]  PCP: Ernestene Kiel, MD GYN:  SU:  OTHER MD: Pierre Bali, Gracelyn Nurse  CC: Patient for follow-up of her 2 positive breast cancer.   HISTORY OF PRESENT ILLNESS:    [name] is 63 years old [name] female.  This woman has been in good health all of her life.  She noted a swelling and discomfort in her right breast in June 2004.  She was seen in the Emergency Room in [place] and was treated for mastitis.  She was treated for a number of months with mastitis and the swelling did not get better. She was given hydrocodone and Cipro.  Finally, the swelling did get better and ultimately the nipple became retracted and she noticed some dimpling in her skin. She had a mammogram in July of 2004 in [place] with subsequent mammogram on May 27, 2003, by Dr. Isaiah Blakes.  Mammogram done on September 30 showed marked increased density in the left breast.  Biopsy was performed the same day.  It was noted at the 12 o'clock position, deep in the breast was a focal hypoechoic mass, at least 3.5 cm in diameter.  Biopsy did in fact show invasive in situ mammary carcinoma. This was felt to be both at least intermediate, high grade.  No definite lymphovascular invasion was identified. ER and PR negative, Her2 testing positive. Chole continues to have pain in her breast.  She continues to take hydrocodone a number of times a day.  She has been seen by Dr. Rosana Hoes, who felt that neoadjuvant chemotherapy would be required.    Initial staging studies showed evidence of liver and lung mets.   Patient also has evidence of bone lesions. Patient started neoadjuvant chemotherapy, Taxotere/Carbo/Herceptin in October 2004.   Patient had a CT scan in December 2004 which demonstrated extensive clot in the SVC innominate vein,  bilateral jugular vein and  She was started on anticoagulation therapy. She received a total of 6 cycles of Taxotere/Carbo/Herceptin, completed in April 2005."  Patient has been on  trastuzumab continued indefinitely; has also received lapatinib and capecitabine for variable intervals in 2007-2008. Most recent echo 12/01/2013 showed an ejection fraction of 55%. She is status post bilateral mastectomies with bilateral axillary lymph node dissection 12/07/2004, showing (a) on the right, a mypT1c ypN1 invasive ductal carcinoma, grade 3, estrogen and progesterone receptor negative, HER-2 positive, with an MIB-1 of 31% (b) on the left, ypT2 ypN1 invasive ductal carcinoma, grade 2, estrogen and progesterone receptor negative, HER-2 positive, with an MIB-1 of 35%. She is Status post radiation June through July of 2006, to the right chest wall, left chest wall, bilateral supraclavicular fossae, and bilateral axillary boosts; with additional radiation to the right and left chest walls and the central chest wall completed November of 2007. She is status post ixempra x9 completed August of 2009. 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 Patient has chronic pain, with negative PET scan 08/24/2013 (no evidence of active cancer). On Neurontin and Tramadol therapy.  INTERVAL HISTORY: Anelisse returns today accompanied by her husband, Abe People, for follow up of her metastatic breast cancer. She continues to receive Herceptin every three weeks and is tolerating that well.  She has been having headaches for a few weeks and these have gotten slightly better.  She also has some neck pain and her PCP had evaluated her for this and had ordered xrays.  She is upset because she doesn't have any more scheduled herceptin appointments.  She denies fevers, chills, new pain, nausea, vomiting, diarrhea, chest pain, palpitations, orthopnea, DOE or any further  concerns.    REVIEW OF SYSTEMS: A 10 point review of systems was conducted and is otherwise negative except for what is noted above.    PAST MEDICAL HISTORY: Past Medical History  Diagnosis Date  . Breast cancer     mets to liver and lung  . Hypertension   . SVC syndrome   . History of chemotherapy Feb. 2006    taxotere/herceptin/carboplatin  . Radiation 07/31/2006    left upper chest  . Radiation 06/17/2006-06/27/2006    6480 cGy bilat. chest wall  . Neuropathy   . Thrombosis   . Breast cancer metastasized to multiple sites 02/26/2013    PAST SURGICAL HISTORY: Past Surgical History  Procedure Laterality Date  . Tubal ligation  1986  . Cholecystectomy  1989  . Mastectomy Bilateral   . Ankle surgery    . Peripherally inserted central catheter insertion    . Back surgery      FAMILY HISTORY Family History  Problem Relation Age of Onset  . Heart failure Father   . Cancer Father     Prostate cancer  . Heart failure Brother   . Cancer Brother     Prostate cancer  . Diabetes Maternal Aunt    She had three brothers, one died of gunshot wound, one of complications of diabetes mellitus and one of myocardial infarction.  She has no sisters.  Mother died of complications of brain metastasis in 66.  Father has had a myocardial infarction in 1999.  No history of breast or ovarian cancer in the family.     GYNECOLOGIC HISTORY:   Menarche at age 20.  Gravida 3, para 3.  First live birth at age 89.  No history of breast feeding. No history of hormonal replacement therapy.   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 Her husband used to work as a Art gallery manager, but is now retired. She has three children, Monette who lives in [place] and works as a Hydrographic surveyor, Financial risk analyst who lives in [place] and works as a Administrator, and [place] who lives in [place] and also works as a Hydrographic surveyor. The patient has 12 grandchildren  and 4 great-grandchildren. She attends a [employer]. Very involved with school kids.  ADVANCED DIRECTIVES:  Not in place  HEALTH MAINTENANCE: (Updated 06/12/2013) History  Substance Use Topics  . Smoking status: Never Smoker   . Smokeless tobacco: Never Used  . Alcohol Use: Yes     Comment: occasional     Colonoscopy: Never and "I don't want one"  PAP:  1987  Bone density:  Never  Lipid panel:  Not on file    Allergies  Allergen Reactions  . Adhesive [Tape] Other (See Comments)    Tears skin   . Penicillins Hives    Current Outpatient Prescriptions  Medication Sig Dispense Refill  . acetaminophen (TYLENOL) 500 MG tablet Take 500 mg by mouth every 6 (six) hours as needed for pain or fever.      Marland Kitchen albuterol (PROVENTIL HFA;VENTOLIN HFA) 108 (90 BASE) MCG/ACT inhaler Inhale 2 puffs into the lungs every 6 (six) hours as needed  for wheezing.  1 Inhaler  3  . ALPRAZolam (XANAX) 1 MG tablet Take 1 tablet (1 mg total) by mouth 3 (three) times daily as needed for anxiety.  90 tablet  1  . amLODipine (NORVASC) 10 MG tablet Take 10 mg by mouth every morning.      . baclofen (LIORESAL) 10 MG tablet       . diclofenac sodium (VOLTAREN) 1 % GEL Apply 2 g topically daily as needed (for pain). Apply to knees and shoulders  100 g  6  . furosemide (LASIX) 80 MG tablet Take 80 mg by mouth.      . gabapentin (NEURONTIN) 300 MG capsule 600 mg 2 (two) times daily. TAKE 2 CAPSULES BY MOUTH TWO TIMES DAILY      . losartan (COZAAR) 100 MG tablet Take 100 mg by mouth every morning.      . metoprolol succinate (TOPROL-XL) 50 MG 24 hr tablet Take 50 mg by mouth daily. Take with or immediately following a meal.      . potassium chloride (MICRO-K) 10 MEQ CR capsule TAKE ONE CAPSULE BY MOUTH TWICE DAILY  180 capsule  1  . prochlorperazine (COMPAZINE) 10 MG tablet Take 1 tablet (10 mg total) by mouth every 6 (six) hours as needed (for nausea).  30 tablet  3  . spironolactone (ALDACTONE) 25 MG  tablet Take 0.5 tablets (12.5 mg total) by mouth daily.  30 tablet  3  . traMADol (ULTRAM) 50 MG tablet Take 2 tablets (100 mg total) by mouth every 6 (six) hours as needed.  120 tablet  1  . warfarin (COUMADIN) 2.5 MG tablet TAKE 1 TABLET BY MOUTH EVERY DAY  30 tablet  2  . warfarin (COUMADIN) 5 MG tablet On Tuesdays and Fridays take two 5 mg tablets to make 10 mg. Take one 2.5 mg tablet along with a $Remo'5mg'Mcbdf$  to make 7.5 mg all other days  90 tablet  1  . zolpidem (AMBIEN) 10 MG tablet Take 1 tablet (10 mg total) by mouth at bedtime as needed for sleep.  30 tablet  0  . temazepam (RESTORIL) 30 MG capsule        No current facility-administered medications for this visit.   Facility-Administered Medications Ordered in Other Visits  Medication Dose Route Frequency Provider Last Rate Last Dose  . sodium chloride 0.9 % injection 10 mL  10 mL Intravenous PRN Chauncey Cruel, MD   10 mL at 12/17/13 1103    OBJECTIVE:  Middle aged African American woman who appears stated age 40 Vitals:   05/13/14 0932  BP: 158/73  Pulse: 72  Temp: 98.3 F (36.8 C)  Resp: 20     Body mass index is 51.3 kg/(m^2).    ECOG FS: 1 Filed Weights   05/13/14 0932  Weight: 289 lb 8 oz (131.316 kg)   Filed Weights   05/13/14 0932  Weight: 289 lb 8 oz (131.316 kg)   GENERAL: Patient is a well appearing female in no acute distress HEENT:  Sclerae anicteric.  Oropharynx clear and moist. No ulcerations or evidence of oropharyngeal candidiasis. Neck is supple.  NODES:  No cervical, supraclavicular, or axillary lymphadenopathy palpated.  BREAST EXAM:  Deferred. LUNGS:  Clear to auscultation bilaterally.  No wheezes or rhonchi. HEART:  Regular rate and rhythm. No murmur appreciated. ABDOMEN:  Soft, nontender.  Positive, normoactive bowel sounds. No organomegaly palpated. MSK:  No focal spinal tenderness to palpation. Full range of motion bilaterally in the  upper extremities. EXTREMITIES:  No peripheral edema.    SKIN:  Clear with no obvious rashes or skin changes. No nail dyscrasia. NEURO:  Nonfocal. Well oriented.  Appropriate affect.  LAB RESULTS: Lab Results  Component Value Date   WBC 9.5 05/13/2014   NEUTROABS 5.7 05/13/2014   HGB 12.1 05/13/2014   HCT 36.4 05/13/2014   MCV 81.5 05/13/2014   PLT 277 05/13/2014      Chemistry      Component Value Date/Time   NA 143 04/22/2014 0910   NA 138 03/21/2013 0658   K 4.0 04/22/2014 0910   K 3.6 03/21/2013 0658   CL 103 03/21/2013 0658   CL 104 01/30/2013 0850   CO2 28 04/22/2014 0910   CO2 27 03/21/2013 0658   BUN 23.7 04/22/2014 0910   BUN 11 03/21/2013 0658   CREATININE 1.1 04/22/2014 0910   CREATININE 0.82 03/21/2013 0658      Component Value Date/Time   CALCIUM 9.4 04/22/2014 0910   CALCIUM 9.5 03/21/2013 0658   ALKPHOS 93 04/22/2014 0910   ALKPHOS 87 04/11/2012 0837   AST 16 04/22/2014 0910   AST 19 04/11/2012 0837   ALT 13 04/22/2014 0910   ALT 14 04/11/2012 0837   BILITOT 0.62 04/22/2014 0910   BILITOT 0.8 04/11/2012 0837       ASSESSMENT: 63 y.o.  [place], [place], woman  (1)  with a history of inflammatory right breast cancer metastatic at presentation September 2004 with involvement of liver and bone, HER-2 positive, estrogen and progesterone receptor negative  (2) treated with carboplatin, docetaxel and Herceptin x 6 completed April 2005  (3) trastuzumab continued indefinitely; has also received lapatinib and capecitabine for variable intervals in 2007-2008. Most recent echo 12/01/2013 showed an ejection fraction of 55%.  (4) status post bilateral mastectomies with bilateral axillary lymph node dissection 12/07/2004, showing  (a) on the right, a mypT1c ypN1 invasive ductal carcinoma, grade 3, estrogen and progesterone receptor negative, HER-2 positive, with an MIB-1 of 31%  (b) on the left, ypT2 ypN1 invasive ductal carcinoma, grade 2, estrogen and progesterone receptor negative, HER-2 positive, with an MIB-1 of 35%.  (5)  Status  post radiation June through July of 2006, to the right chest wall, left chest wall, bilateral supraclavicular fossae, and bilateral axillary boosts; with additional radiation to the right and left chest walls and the central chest wall completed November of 2007  (6) status post Ixempra x9 completed August of 2009.  (7) history of superior vena caval syndrome, on life long anticoagulation   (8)  History of chemotherapy-induced neuropathy.   (9)  chronic pain, with negative PET scan 08/24/2013 (no evidence of active cancer). On Neurontin and Tramadol   PLAN:  1. Patient will proceed with Herceptin today.  She continues to tolerate this well and her lab work remains stable.    2. Patient getting a PET SCAN on 06/01/14 and then a followup with Dr Jana Hakim on 06/03/14 for labs, evaluation and Herceptin.  Due to her neck pain, she did see her PCP who requested a cervical x ray.  I ordered for it to be done here so she can have it on the same day as her PET per her request.    3. Her echo was fine in July 2015, she is due for another one in October, and I have requested this be scheduled.    4.  She will continue to f/u with the coumadin clinic.    Francene will  return on 06/01/14 for scans and cervical xray and on 06/03/14 for labs, an appt with Dr. Jana Hakim, and her every three week Herceptin.   She knows to call us in the interim for any questions or concerns.  We can certainly see her sooner if needed.   I spent 25 minutes counseling the patient face to face.  The total time spent in the appointment was 30 minutes.   Minette Headland, [place] [phone] 05/13/2014  9:39 AM

## 2014-05-13 NOTE — Patient Instructions (Signed)

## 2014-05-17 ENCOUNTER — Telehealth: Payer: Self-pay | Admitting: Oncology

## 2014-05-17 ENCOUNTER — Telehealth: Payer: Self-pay | Admitting: *Deleted

## 2014-05-17 NOTE — Telephone Encounter (Signed)
Per staff message and POF I have scheduled appts. Advised scheduler of appts. JMW  

## 2014-05-27 ENCOUNTER — Other Ambulatory Visit: Payer: Self-pay | Admitting: *Deleted

## 2014-05-27 ENCOUNTER — Telehealth: Payer: Self-pay | Admitting: *Deleted

## 2014-05-27 NOTE — Telephone Encounter (Signed)
Pt left message stating she needs a refill on sleeping pill - " at my last visit - Dr Linward Natal gave me zolpidem but that does seem to work as good as my Ambien did "  "Also I need something different than the tramadol - for my joint pain and muscle spasms. "  This RN returned call and obtained number identified VM.  Message left to return call.

## 2014-05-28 ENCOUNTER — Other Ambulatory Visit: Payer: Self-pay | Admitting: *Deleted

## 2014-05-28 MED ORDER — TEMAZEPAM 30 MG PO CAPS: 30.0000 mg | ORAL_CAPSULE | Freq: Every evening | ORAL | Status: DC | PRN

## 2014-06-01 ENCOUNTER — Ambulatory Visit (HOSPITAL_COMMUNITY)
Admission: RE | Admit: 2014-06-01 | Discharge: 2014-06-01 | Disposition: A | Discharge status: Home / Self Care | Payer: Medicare HMO | Source: Ambulatory Visit | Attending: Oncology | Admitting: Oncology

## 2014-06-01 ENCOUNTER — Ambulatory Visit (HOSPITAL_COMMUNITY)
Admission: RE | Admit: 2014-06-01 | Discharge: 2014-06-01 | Disposition: A | Discharge status: Home / Self Care | Payer: Medicare HMO | Source: Ambulatory Visit | Attending: Adult Health | Admitting: Adult Health

## 2014-06-01 ENCOUNTER — Ambulatory Visit (HOSPITAL_COMMUNITY): Payer: Medicare HMO

## 2014-06-01 DIAGNOSIS — C50919 Malignant neoplasm of unspecified site of unspecified female breast: Secondary | ICD-10-CM | POA: Diagnosis not present

## 2014-06-01 DIAGNOSIS — R209 Unspecified disturbances of skin sensation: Secondary | ICD-10-CM | POA: Insufficient documentation

## 2014-06-01 DIAGNOSIS — M542 Cervicalgia: Secondary | ICD-10-CM

## 2014-06-01 DIAGNOSIS — I82409 Acute embolism and thrombosis of unspecified deep veins of unspecified lower extremity: Secondary | ICD-10-CM

## 2014-06-01 LAB — GLUCOSE, CAPILLARY: Glucose-Capillary: 104 mg/dL — ABNORMAL HIGH (ref 70–99)

## 2014-06-01 MED ORDER — FLUDEOXYGLUCOSE F - 18 (FDG) INJECTION
14.3000 | Freq: Once | INTRAVENOUS | Status: AC | PRN
  Administered 2014-06-01: 14.3 via INTRAVENOUS

## 2014-06-02 ENCOUNTER — Other Ambulatory Visit: Payer: Self-pay | Admitting: *Deleted

## 2014-06-02 DIAGNOSIS — C50919 Malignant neoplasm of unspecified site of unspecified female breast: Secondary | ICD-10-CM

## 2014-06-03 ENCOUNTER — Ambulatory Visit (HOSPITAL_BASED_OUTPATIENT_CLINIC_OR_DEPARTMENT_OTHER): Payer: Commercial Managed Care - HMO | Admitting: Oncology

## 2014-06-03 ENCOUNTER — Telehealth: Payer: Self-pay | Admitting: Oncology

## 2014-06-03 ENCOUNTER — Ambulatory Visit (HOSPITAL_BASED_OUTPATIENT_CLINIC_OR_DEPARTMENT_OTHER): Payer: Self-pay | Admitting: Pharmacist

## 2014-06-03 ENCOUNTER — Telehealth: Payer: Self-pay | Admitting: *Deleted

## 2014-06-03 ENCOUNTER — Ambulatory Visit: Payer: Commercial Managed Care - HMO

## 2014-06-03 ENCOUNTER — Ambulatory Visit (HOSPITAL_BASED_OUTPATIENT_CLINIC_OR_DEPARTMENT_OTHER): Payer: Commercial Managed Care - HMO

## 2014-06-03 ENCOUNTER — Other Ambulatory Visit (HOSPITAL_BASED_OUTPATIENT_CLINIC_OR_DEPARTMENT_OTHER): Payer: Commercial Managed Care - HMO

## 2014-06-03 ENCOUNTER — Other Ambulatory Visit: Payer: Medicare HMO

## 2014-06-03 VITALS — BP 158/84 | HR 62 | Temp 98.2°F | Resp 18 | Ht 63.0 in | Wt 291.1 lb

## 2014-06-03 DIAGNOSIS — Z5112 Encounter for antineoplastic immunotherapy: Secondary | ICD-10-CM

## 2014-06-03 DIAGNOSIS — C50819 Malignant neoplasm of overlapping sites of unspecified female breast: Secondary | ICD-10-CM

## 2014-06-03 DIAGNOSIS — Z7901 Long term (current) use of anticoagulants: Secondary | ICD-10-CM

## 2014-06-03 DIAGNOSIS — I871 Compression of vein: Secondary | ICD-10-CM

## 2014-06-03 DIAGNOSIS — I82409 Acute embolism and thrombosis of unspecified deep veins of unspecified lower extremity: Secondary | ICD-10-CM

## 2014-06-03 DIAGNOSIS — Z95828 Presence of other vascular implants and grafts: Secondary | ICD-10-CM

## 2014-06-03 DIAGNOSIS — C50919 Malignant neoplasm of unspecified site of unspecified female breast: Secondary | ICD-10-CM

## 2014-06-03 DIAGNOSIS — C7951 Secondary malignant neoplasm of bone: Secondary | ICD-10-CM

## 2014-06-03 LAB — PROTIME-INR
INR: 3 (ref 2.00–3.50)
Protime: 36 Seconds — ABNORMAL HIGH (ref 10.6–13.4)

## 2014-06-03 LAB — CBC WITH DIFFERENTIAL/PLATELET
BASO%: 0.5 % (ref 0.0–2.0)
Basophils Absolute: 0 10*3/uL (ref 0.0–0.1)
EOS%: 1.8 % (ref 0.0–7.0)
Eosinophils Absolute: 0.2 10*3/uL (ref 0.0–0.5)
HCT: 34.4 % — ABNORMAL LOW (ref 34.8–46.6)
HGB: 11.6 g/dL (ref 11.6–15.9)
LYMPH#: 2.4 10*3/uL (ref 0.9–3.3)
LYMPH%: 28.6 % (ref 14.0–49.7)
MCH: 27.2 pg (ref 25.1–34.0)
MCHC: 33.8 g/dL (ref 31.5–36.0)
MCV: 80.2 fL (ref 79.5–101.0)
MONO#: 0.6 10*3/uL (ref 0.1–0.9)
MONO%: 7.6 % (ref 0.0–14.0)
NEUT#: 5.2 10*3/uL (ref 1.5–6.5)
NEUT%: 61.5 % (ref 38.4–76.8)
Platelets: 268 10*3/uL (ref 145–400)
RBC: 4.29 10*6/uL (ref 3.70–5.45)
RDW: 15.7 % — AB (ref 11.2–14.5)
WBC: 8.4 10*3/uL (ref 3.9–10.3)

## 2014-06-03 LAB — COMPREHENSIVE METABOLIC PANEL (CC13)
ALBUMIN: 3.3 g/dL — AB (ref 3.5–5.0)
ALT: 11 U/L (ref 0–55)
ANION GAP: 8 meq/L (ref 3–11)
AST: 11 U/L (ref 5–34)
Alkaline Phosphatase: 97 U/L (ref 40–150)
BUN: 12.9 mg/dL (ref 7.0–26.0)
CO2: 26 meq/L (ref 22–29)
Calcium: 9.9 mg/dL (ref 8.4–10.4)
Chloride: 107 mEq/L (ref 98–109)
Creatinine: 0.8 mg/dL (ref 0.6–1.1)
Glucose: 110 mg/dl (ref 70–140)
POTASSIUM: 3.8 meq/L (ref 3.5–5.1)
SODIUM: 141 meq/L (ref 136–145)
TOTAL PROTEIN: 8.5 g/dL — AB (ref 6.4–8.3)
Total Bilirubin: 0.44 mg/dL (ref 0.20–1.20)

## 2014-06-03 LAB — POCT INR: INR: 3

## 2014-06-03 MED ORDER — LORAZEPAM 2 MG/ML IJ SOLN
INTRAMUSCULAR | Status: AC
  Filled 2014-06-03: qty 1

## 2014-06-03 MED ORDER — ACETAMINOPHEN 325 MG PO TABS
ORAL_TABLET | ORAL | Status: AC
  Filled 2014-06-03: qty 2

## 2014-06-03 MED ORDER — DIPHENHYDRAMINE HCL 25 MG PO CAPS
ORAL_CAPSULE | ORAL | Status: AC
  Filled 2014-06-03: qty 2

## 2014-06-03 MED ORDER — TRASTUZUMAB CHEMO INJECTION 440 MG
6.0000 mg/kg | Freq: Once | INTRAVENOUS | Status: AC
  Administered 2014-06-03: 756 mg via INTRAVENOUS
  Filled 2014-06-03: qty 36

## 2014-06-03 MED ORDER — SODIUM CHLORIDE 0.9 % IV SOLN
Freq: Once | INTRAVENOUS | Status: AC
  Administered 2014-06-03: 10:00:00 via INTRAVENOUS

## 2014-06-03 MED ORDER — SODIUM CHLORIDE 0.9 % IJ SOLN
10.0000 mL | INTRAMUSCULAR | Status: DC | PRN
  Administered 2014-06-03: 10 mL
  Filled 2014-06-03: qty 10

## 2014-06-03 MED ORDER — HEPARIN SOD (PORK) LOCK FLUSH 100 UNIT/ML IV SOLN
500.0000 [IU] | Freq: Once | INTRAVENOUS | Status: AC
  Administered 2014-06-03: 500 [IU] via INTRAVENOUS
  Filled 2014-06-03: qty 5

## 2014-06-03 MED ORDER — DIPHENHYDRAMINE HCL 25 MG PO CAPS
50.0000 mg | ORAL_CAPSULE | Freq: Once | ORAL | Status: AC
  Administered 2014-06-03: 50 mg via ORAL

## 2014-06-03 MED ORDER — SODIUM CHLORIDE 0.9 % IJ SOLN
10.0000 mL | INTRAMUSCULAR | Status: DC | PRN
  Administered 2014-06-03: 10 mL via INTRAVENOUS
  Filled 2014-06-03: qty 10

## 2014-06-03 MED ORDER — LORAZEPAM 2 MG/ML IJ SOLN
1.0000 mg | Freq: Once | INTRAMUSCULAR | Status: AC | PRN
  Administered 2014-06-03: 1 mg via INTRAVENOUS

## 2014-06-03 MED ORDER — ACETAMINOPHEN 325 MG PO TABS
650.0000 mg | ORAL_TABLET | Freq: Once | ORAL | Status: AC
  Administered 2014-06-03: 650 mg via ORAL

## 2014-06-03 MED ORDER — LORAZEPAM 1 MG PO TABS
ORAL_TABLET | ORAL | Status: AC
  Filled 2014-06-03: qty 1

## 2014-06-03 MED ORDER — HEPARIN SOD (PORK) LOCK FLUSH 100 UNIT/ML IV SOLN
500.0000 [IU] | Freq: Once | INTRAVENOUS | Status: AC | PRN
  Administered 2014-06-03: 500 [IU]
  Filled 2014-06-03: qty 5

## 2014-06-03 NOTE — Telephone Encounter (Signed)
per pof to sch pt appt-semt MW emailt o sch trmt-sent Tia Masker email to pre-cert-will call pt with appt once reply-pt to get updated copy of sch b4 leaving

## 2014-06-03 NOTE — Progress Notes (Signed)
[facility]  [phone] [phone]  OFFICE PROGRESS NOTE  ID: [name]   DOB: 18-Feb-1951  MR#: [id number]  VEL#:[id number]  PCP: Ernestene Kiel, MD GYN:  SU:  OTHER MD: Alver Sorrow  CC: Metastatic HER-2 positive breast cancer.  CURRENT TREATMENT: Trastuzumab   HISTORY OF PRESENT ILLNESS:   From the earlier summary:  [name] is 63 years old [name] female.  This woman has been in good health all of her life.  She noted a swelling and discomfort in her right breast in June 2004.  She was seen in the Emergency Room in [place] and was treated for mastitis.  She was treated for a number of months with mastitis and the swelling did not get better. She was given hydrocodone and Cipro.  Finally, the swelling did get better and ultimately the nipple became retracted and she noticed some dimpling in her skin. She had a mammogram in July of 2004 in [place] with subsequent mammogram on May 27, 2003, by Dr. Isaiah Blakes.  Mammogram done on September 30 showed marked increased density in the left breast.  Biopsy was performed the same day.  It was noted at the 12 o'clock position, deep in the breast was a focal hypoechoic mass, at least 3.5 cm in diameter.  Biopsy did in fact show invasive in situ mammary carcinoma. This was felt to be both at least intermediate, high grade.  No definite lymphovascular invasion was identified. ER and PR negative, Her2 testing positive. Shelsey continues to have pain in her breast.  She continues to take hydrocodone a number of times a day.  She has been seen by Dr. Rosana Hoes, who felt that neoadjuvant chemotherapy would be required.    Initial staging studies showed evidence of liver and lung mets.   Patient also has evidence of bone lesions. Patient started neoadjuvant chemotherapy, Taxotere/Carbo/Herceptin in October 2004.   Patient had a CT scan in December 2004 which demonstrated  extensive clot in the SVC innominate vein, bilateral jugular vein and  She was started on anticoagulation therapy. She received a total of 6 cycles of Taxotere/Carbo/Herceptin, completed in April 2005."  Patient has been on  trastuzumab continued indefinitely; has also received lapatinib and capecitabine for variable intervals in 2007-2008. Most recent echo 12/01/2013 showed an ejection fraction of 55%. She is status post bilateral mastectomies with bilateral axillary lymph node dissection 12/07/2004, showing (a) on the right, a mypT1c ypN1 invasive ductal carcinoma, grade 3, estrogen and progesterone receptor negative, HER-2 positive, with an MIB-1 of 31% (b) on the left, ypT2 ypN1 invasive ductal carcinoma, grade 2, estrogen and progesterone receptor negative, HER-2 positive, with an MIB-1 of 35%. She is Status post radiation June through July of 2006, to the right chest wall, left chest wall, bilateral supraclavicular fossae, and bilateral axillary boosts; with additional radiation to the right and left chest walls and the central chest wall completed November of 2007. She is status post ixempra x9 completed August of 2009. 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 Patient has chronic pain, with negative PET scan 08/24/2013 (no evidence of active cancer). On Neurontin and Tramadol therapy.  Her subsequent history is as detailed below  INTERVAL HISTORY: [name] returns today for followup of her stage IV breast cancer accompanied by her husband, Abe People. She continues on trastuzumab every 3 weeks. She is tolerating that with no side effects that she is  aware of. At the last visit she was set up for an echo but for some reason I did not happen. She did have a PET scan 2 days ago and this is entirely negative except for a 1.6 cm left sided lymph node near the hyoid bone with an SUV max of 4.8. This is felt to be likely  reactive. The rest of the exam is entirely negative  REVIEW OF SYSTEMS: She had a strange episode last week where everything hurts, including her back and chest. She tried Temodar without much success. Those pains are now better. She has rare headaches, not accompanied by nausea vomiting or dizziness. She works in her yard and garden and keeps some of her 12 grandchildren overnight frequently. She denies any chest pain or pressure, shortness of breath, or worsening edema. There has been no overt bleeding. A detailed review of systems today was otherwise entirely stable  PAST MEDICAL HISTORY: Past Medical History  Diagnosis Date  . Breast cancer     mets to liver and lung  . Hypertension   . SVC syndrome   . History of chemotherapy Feb. 2006    taxotere/herceptin/carboplatin  . Radiation 07/31/2006    left upper chest  . Radiation 06/17/2006-06/27/2006    6480 cGy bilat. chest wall  . Neuropathy   . Thrombosis   . Breast cancer metastasized to multiple sites 02/26/2013    PAST SURGICAL HISTORY: Past Surgical History  Procedure Laterality Date  . Tubal ligation  1986  . Cholecystectomy  1989  . Mastectomy Bilateral   . Ankle surgery    . Peripherally inserted central catheter insertion    . Back surgery      FAMILY HISTORY Family History  Problem Relation Age of Onset  . Heart failure Father   . Cancer Father     Prostate cancer  . Heart failure Brother   . Cancer Brother     Prostate cancer  . Diabetes Maternal Aunt    She had three brothers, one died of gunshot wound, one of complications of diabetes mellitus and one of myocardial infarction.  She has no sisters.  Mother died of complications of brain metastasis in 1.  Father has had a myocardial infarction in 1999.  No history of breast or ovarian cancer in the family.     GYNECOLOGIC HISTORY:   Menarche at age 50.  Gravida 3, para 3.  First live birth at age 64.  No history of breast feeding. No history of hormonal  replacement therapy.   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 Her husband used to work as a Art gallery manager, but is now retired. She has three children, Monette who lives in [place] and works as a Hydrographic surveyor, Financial risk analyst who lives in [place] and works as a Administrator, and Antioch who lives in [place] and also works as a Hydrographic surveyor. The patient has 12 grandchildren and 4 great-grandchildren. She attends a [employer]. Very involved with school kids.  ADVANCED DIRECTIVES:  Not in place  HEALTH MAINTENANCE: (Updated 06/12/2013) History  Substance Use Topics  . Smoking status: Never Smoker   . Smokeless tobacco: Never Used  . Alcohol Use: Yes     Comment: occasional     Colonoscopy: Never and "I don't want one"  PAP:  1987  Bone density:  Never  Lipid panel:  Not on file    Allergies  Allergen Reactions  . Adhesive [Tape] Other (See Comments)    Tears skin   . Penicillins Hives    Current Outpatient Prescriptions  Medication Sig Dispense Refill  . acetaminophen (TYLENOL) 500 MG tablet Take 500 mg by mouth every 6 (six) hours as needed for pain or fever.      Marland Kitchen albuterol (PROVENTIL HFA;VENTOLIN HFA) 108 (90 BASE) MCG/ACT inhaler Inhale 2 puffs into the lungs every 6 (six) hours as needed for wheezing.  1 Inhaler  3  . ALPRAZolam (XANAX) 1 MG tablet Take 1 tablet (1 mg total) by mouth 3 (three) times daily as needed for anxiety.  90 tablet  1  . amLODipine (NORVASC) 10 MG tablet Take 10 mg by mouth every morning.      . baclofen (LIORESAL) 10 MG tablet       . diclofenac sodium (VOLTAREN) 1 % GEL Apply 2 g topically daily as needed (for pain). Apply to knees and shoulders  100 g  6  . furosemide (LASIX) 80 MG tablet Take 80 mg by mouth.      . gabapentin (NEURONTIN) 300 MG capsule 600 mg 2 (two) times daily. TAKE 2 CAPSULES BY MOUTH TWO TIMES DAILY      . losartan (COZAAR) 100 MG tablet Take 100 mg  by mouth every morning.      . metoprolol succinate (TOPROL-XL) 50 MG 24 hr tablet Take 50 mg by mouth daily. Take with or immediately following a meal.      . potassium chloride (MICRO-K) 10 MEQ CR capsule TAKE ONE CAPSULE BY MOUTH TWICE DAILY  180 capsule  1  . prochlorperazine (COMPAZINE) 10 MG tablet Take 1 tablet (10 mg total) by mouth every 6 (six) hours as needed (for nausea).  30 tablet  3  . spironolactone (ALDACTONE) 25 MG tablet Take 0.5 tablets (12.5 mg total) by mouth daily.  30 tablet  3  . temazepam (RESTORIL) 30 MG capsule       . temazepam (RESTORIL) 30 MG capsule Take 1 capsule (30 mg total) by mouth at bedtime as needed for sleep.  30 capsule  1  . traMADol (ULTRAM) 50 MG tablet Take 2 tablets (100 mg total) by mouth every 6 (six) hours as needed.  120 tablet  1  . warfarin (COUMADIN) 2.5 MG tablet TAKE 1 TABLET BY MOUTH EVERY DAY  30 tablet  2  . warfarin (COUMADIN) 5 MG tablet On Tuesdays and Fridays take two 5 mg tablets to make 10 mg. Take one 2.5 mg tablet along with a 83m to make 7.5 mg all other days  90 tablet  1  . zolpidem (AMBIEN) 10 MG tablet Take 1 tablet (10 mg total) by mouth at bedtime as needed for sleep.  30 tablet  0   No current facility-administered medications for this visit.   Facility-Administered Medications Ordered in Other Visits  Medication Dose Route Frequency Provider Last Rate Last Dose  . sodium chloride 0.9 % injection 10 mL  10 mL Intravenous PRN GChauncey Cruel MD   10 mL at 12/17/13 1103    OBJECTIVE:  Middle aged African American woman in no acute distress Filed Vitals:   06/03/14 0900  BP: 158/84  Pulse: 62  Temp: 98.2 F (36.8 C)  Resp: 18     Body mass index is 51.58 kg/(m^2).    ECOG FS: 1 Filed Weights   06/03/14 0900  Weight: 291 lb 1.6 oz (132.042 kg)   FAutoliv  06/03/14 0900  Weight: 291 lb 1.6 oz (132.042 kg)   Sclerae unicteric, pupils equal and reactive Oropharynx clear and moist No cervical or  supraclavicular adenopathy Lungs no rales or rhonchi Heart regular rate and rhythm Abd soft, nontender, positive bowel sounds MSK no focal spinal tenderness, no upper extremity lymphedema Neuro: nonfocal, well oriented, appropriate affect Breasts: Status post bilateral mastectomies. There is no evidence of chest wall recurrence. Both axillae are benign.    LAB RESULTS: Lab Results  Component Value Date   WBC 8.4 06/03/2014   NEUTROABS 5.2 06/03/2014   HGB 11.6 06/03/2014   HCT 34.4* 06/03/2014   MCV 80.2 06/03/2014   PLT 268 06/03/2014      Chemistry      Component Value Date/Time   NA 141 06/03/2014 0822   NA 138 03/21/2013 0658   K 3.8 06/03/2014 0822   K 3.6 03/21/2013 0658   CL 103 03/21/2013 0658   CL 104 01/30/2013 0850   CO2 26 06/03/2014 0822   CO2 27 03/21/2013 0658   BUN 12.9 06/03/2014 0822   BUN 11 03/21/2013 0658   CREATININE 0.8 06/03/2014 0822   CREATININE 0.82 03/21/2013 0658      Component Value Date/Time   CALCIUM 9.9 06/03/2014 0822   CALCIUM 9.5 03/21/2013 0658   ALKPHOS 97 06/03/2014 0822   ALKPHOS 87 04/11/2012 0837   AST 11 06/03/2014 0822   AST 19 04/11/2012 0837   ALT 11 06/03/2014 0822   ALT 14 04/11/2012 0837   BILITOT 0.44 06/03/2014 0822   BILITOT 0.8 04/11/2012 0837       ASSESSMENT: 63 y.o.  [place], [place], woman  (1)  with a history of inflammatory right breast cancer metastatic at presentation September 2004 with involvement of liver and bone, HER-2 positive, estrogen and progesterone receptor negative  (2) treated with carboplatin, docetaxel and Herceptin x 6 completed April 2005  (3) trastuzumab continued indefinitely; has also received lapatinib and capecitabine for variable intervals in 2007-2008. Most recent echo 03/02/2014 showed an ejection fraction of 55-60%.  (4) status post bilateral mastectomies with bilateral axillary lymph node dissection 12/07/2004, showing  (a) on the right, a mypT1c ypN1 invasive ductal carcinoma, grade 3,  estrogen and progesterone receptor negative, HER-2 positive, with an MIB-1 of 31%  (b) on the left, ypT2 ypN1 invasive ductal carcinoma, grade 2, estrogen and progesterone receptor negative, HER-2 positive, with an MIB-1 of 35%.  (5)  Status post radiation June through July of 2006, to the right chest wall, left chest wall, bilateral supraclavicular fossae, and bilateral axillary boosts; with additional radiation to the right and left chest walls and the central chest wall completed November of 2007  (6) status post Ixempra x9 completed August of 2009.  (7) history of superior vena caval syndrome, on life long anticoagulation   (8)  History of chemotherapy-induced neuropathy.   (9)  chronic pain, with negative PET scan 08/24/2013 (no evidence of active cancer). On Neurontin and Tramadol   PLAN: Low that is doing terrific, with a PET scan essentially showing no active cancer. (I agree with the radiologist that the small lymph node we are seeing with some increased uptake is likely reactive).  I am not sure what the aches and pains that she had last week were due to. They are better. She understands she cannot take aspirin or nonsteroidals while on Coumadin. She tried tramadol but she did not try gabapentin so I am refilling that for her. She understands that  while she is in remission and it is difficult for me to prescribe narcotics for what likely is chronic osteoarthritis related pain.  For some reason her echo was not done before this visit. I have reentered the order. She needs to have an echocardiogram of course every 3 months while on Herceptin and we are continuing the Herceptin indefinitely. If there is ever evidence of disease progression we would switch to TDM-1  Incidentally she absolutely refuses to be treated in [place]. We will continue to follow your indefinitely  06/03/2014  9:20 AM

## 2014-06-03 NOTE — Telephone Encounter (Signed)
Per staff message and POF I have scheduled appts. Advised scheduler of appts. JMW  

## 2014-06-03 NOTE — Patient Instructions (Signed)
[facility] Discharge Instructions for Patients Receiving Chemotherapy  Today you received the following chemotherapy agents Herceptin.  To help prevent nausea and vomiting after your treatment, we encourage you to take your nausea medication as prescribed.   If you develop nausea and vomiting that is not controlled by your nausea medication, call the clinic.   BELOW ARE SYMPTOMS THAT SHOULD BE REPORTED IMMEDIATELY:  *FEVER GREATER THAN 100.5 F  *CHILLS WITH OR WITHOUT FEVER  NAUSEA AND VOMITING THAT IS NOT CONTROLLED WITH YOUR NAUSEA MEDICATION  *UNUSUAL SHORTNESS OF BREATH  *UNUSUAL BRUISING OR BLEEDING  TENDERNESS IN MOUTH AND THROAT WITH OR WITHOUT PRESENCE OF ULCERS  *URINARY PROBLEMS  *BOWEL PROBLEMS  UNUSUAL RASH Items with * indicate a potential emergency and should be followed up as soon as possible.  Feel free to call the clinic you have any questions or concerns. The clinic phone number is [phone].    

## 2014-06-03 NOTE — Patient Instructions (Signed)

## 2014-06-03 NOTE — Progress Notes (Signed)
INR within goal today. Hg/Hct:  11.6/34.4, Pltc:  268 Pt doing well. No problems or concerns regarding anticoagulation. No significant changes in diet or medications.  She has been off Toprol for a long time. Medication removed from medication list. She has been eating the last of the tomatoes and green beans from the garden and now eating more frozen collard greens and mixed greens for now. No s/s of clotting noted. Continue taking 7.5mg  daily except 5mg  on Fridays.  Recheck INR in 3 weeks on 06/24/14 with lab at 11:00am, infusion at 11:45am and coumadin clinic at 12:00pm.

## 2014-06-03 NOTE — Patient Instructions (Signed)
Continue taking 7.5mg  daily except 5mg  on Fridays.  Recheck INR in 3 weeks on 06/24/14 with lab at 11:00am, infusion at 11:45am and coumadin clinic at 12:00pm.

## 2014-06-04 ENCOUNTER — Telehealth: Payer: Self-pay | Admitting: Oncology

## 2014-06-04 NOTE — Telephone Encounter (Signed)
per Theadora Rama to [facility] CC-pt aware

## 2014-06-04 NOTE — Telephone Encounter (Signed)
per pof to sch ECHO-recvd ptr cert from LD-cld & left message w/pt with appt time/date/location °

## 2014-06-16 ENCOUNTER — Other Ambulatory Visit: Payer: Self-pay | Admitting: *Deleted

## 2014-06-16 ENCOUNTER — Inpatient Hospital Stay (HOSPITAL_COMMUNITY): Payer: Medicare HMO

## 2014-06-16 ENCOUNTER — Emergency Department (HOSPITAL_COMMUNITY): Payer: Medicare HMO

## 2014-06-16 ENCOUNTER — Encounter (HOSPITAL_COMMUNITY): Payer: Self-pay | Admitting: Emergency Medicine

## 2014-06-16 ENCOUNTER — Inpatient Hospital Stay (HOSPITAL_COMMUNITY)
Admission: EM | Admit: 2014-06-16 | Discharge: 2014-06-19 | DRG: 603 | Disposition: A | Discharge status: Home / Self Care | Payer: Medicare HMO | Attending: Internal Medicine | Admitting: Internal Medicine

## 2014-06-16 DIAGNOSIS — L03113 Cellulitis of right upper limb: Secondary | ICD-10-CM

## 2014-06-16 DIAGNOSIS — C787 Secondary malignant neoplasm of liver and intrahepatic bile duct: Secondary | ICD-10-CM | POA: Diagnosis present

## 2014-06-16 DIAGNOSIS — L03313 Cellulitis of chest wall: Secondary | ICD-10-CM | POA: Diagnosis present

## 2014-06-16 DIAGNOSIS — C50911 Malignant neoplasm of unspecified site of right female breast: Secondary | ICD-10-CM | POA: Diagnosis present

## 2014-06-16 DIAGNOSIS — Z79899 Other long term (current) drug therapy: Secondary | ICD-10-CM | POA: Diagnosis not present

## 2014-06-16 DIAGNOSIS — I82409 Acute embolism and thrombosis of unspecified deep veins of unspecified lower extremity: Secondary | ICD-10-CM

## 2014-06-16 DIAGNOSIS — I5032 Chronic diastolic (congestive) heart failure: Secondary | ICD-10-CM

## 2014-06-16 DIAGNOSIS — F419 Anxiety disorder, unspecified: Secondary | ICD-10-CM

## 2014-06-16 DIAGNOSIS — C799 Secondary malignant neoplasm of unspecified site: Secondary | ICD-10-CM

## 2014-06-16 DIAGNOSIS — Z113 Encounter for screening for infections with a predominantly sexual mode of transmission: Secondary | ICD-10-CM

## 2014-06-16 DIAGNOSIS — R509 Fever, unspecified: Secondary | ICD-10-CM | POA: Diagnosis not present

## 2014-06-16 DIAGNOSIS — Z923 Personal history of irradiation: Secondary | ICD-10-CM

## 2014-06-16 DIAGNOSIS — I871 Compression of vein: Secondary | ICD-10-CM

## 2014-06-16 DIAGNOSIS — I890 Lymphedema, not elsewhere classified: Secondary | ICD-10-CM

## 2014-06-16 DIAGNOSIS — C7800 Secondary malignant neoplasm of unspecified lung: Secondary | ICD-10-CM | POA: Diagnosis present

## 2014-06-16 DIAGNOSIS — I509 Heart failure, unspecified: Secondary | ICD-10-CM | POA: Diagnosis present

## 2014-06-16 DIAGNOSIS — M25511 Pain in right shoulder: Secondary | ICD-10-CM

## 2014-06-16 DIAGNOSIS — I10 Essential (primary) hypertension: Secondary | ICD-10-CM | POA: Diagnosis present

## 2014-06-16 DIAGNOSIS — G629 Polyneuropathy, unspecified: Secondary | ICD-10-CM | POA: Diagnosis present

## 2014-06-16 DIAGNOSIS — M542 Cervicalgia: Secondary | ICD-10-CM

## 2014-06-16 DIAGNOSIS — Z86718 Personal history of other venous thrombosis and embolism: Secondary | ICD-10-CM

## 2014-06-16 DIAGNOSIS — Z7901 Long term (current) use of anticoagulants: Secondary | ICD-10-CM | POA: Diagnosis not present

## 2014-06-16 DIAGNOSIS — C50912 Malignant neoplasm of unspecified site of left female breast: Secondary | ICD-10-CM | POA: Diagnosis present

## 2014-06-16 DIAGNOSIS — R7881 Bacteremia: Secondary | ICD-10-CM

## 2014-06-16 DIAGNOSIS — C50919 Malignant neoplasm of unspecified site of unspecified female breast: Secondary | ICD-10-CM

## 2014-06-16 DIAGNOSIS — D638 Anemia in other chronic diseases classified elsewhere: Secondary | ICD-10-CM | POA: Diagnosis present

## 2014-06-16 LAB — INFLUENZA PANEL BY PCR (TYPE A & B)
H1N1 flu by pcr: NOT DETECTED
Influenza A By PCR: NEGATIVE
Influenza B By PCR: NEGATIVE

## 2014-06-16 LAB — URINALYSIS, ROUTINE W REFLEX MICROSCOPIC
Bilirubin Urine: NEGATIVE
Glucose, UA: NEGATIVE mg/dL
Ketones, ur: NEGATIVE mg/dL
Nitrite: NEGATIVE
Protein, ur: NEGATIVE mg/dL
Specific Gravity, Urine: 1.015 (ref 1.005–1.030)
Urobilinogen, UA: 1 mg/dL (ref 0.0–1.0)
pH: 6 (ref 5.0–8.0)

## 2014-06-16 LAB — CBC WITH DIFFERENTIAL/PLATELET
Basophils Absolute: 0 10*3/uL (ref 0.0–0.1)
Basophils Relative: 0 % (ref 0–1)
Eosinophils Absolute: 0 10*3/uL (ref 0.0–0.7)
Eosinophils Relative: 0 % (ref 0–5)
HCT: 30 % — ABNORMAL LOW (ref 36.0–46.0)
Hemoglobin: 10.5 g/dL — ABNORMAL LOW (ref 12.0–15.0)
Lymphocytes Relative: 12 % (ref 12–46)
Lymphs Abs: 1.4 10*3/uL (ref 0.7–4.0)
MCH: 27.2 pg (ref 26.0–34.0)
MCHC: 35 g/dL (ref 30.0–36.0)
MCV: 77.7 fL — ABNORMAL LOW (ref 78.0–100.0)
Monocytes Absolute: 0.7 10*3/uL (ref 0.1–1.0)
Monocytes Relative: 6 % (ref 3–12)
Neutro Abs: 9.8 10*3/uL — ABNORMAL HIGH (ref 1.7–7.7)
Neutrophils Relative %: 82 % — ABNORMAL HIGH (ref 43–77)
Platelets: 203 10*3/uL (ref 150–400)
RBC: 3.86 MIL/uL — ABNORMAL LOW (ref 3.87–5.11)
RDW: 15.3 % (ref 11.5–15.5)
WBC: 12 10*3/uL — ABNORMAL HIGH (ref 4.0–10.5)

## 2014-06-16 LAB — BASIC METABOLIC PANEL
Anion gap: 13 (ref 5–15)
BUN: 14 mg/dL (ref 6–23)
CO2: 23 mEq/L (ref 19–32)
Calcium: 8.9 mg/dL (ref 8.4–10.5)
Chloride: 105 mEq/L (ref 96–112)
Creatinine, Ser: 0.97 mg/dL (ref 0.50–1.10)
GFR calc Af Amer: 71 mL/min — ABNORMAL LOW (ref >90)
GFR calc non Af Amer: 61 mL/min — ABNORMAL LOW (ref >90)
Glucose, Bld: 116 mg/dL — ABNORMAL HIGH (ref 70–99)
Potassium: 3.9 mEq/L (ref 3.7–5.3)
Sodium: 141 mEq/L (ref 137–147)

## 2014-06-16 LAB — URINE MICROSCOPIC-ADD ON

## 2014-06-16 LAB — PROTIME-INR
INR: 2.08 — ABNORMAL HIGH (ref 0.00–1.49)
Prothrombin Time: 23.6 seconds — ABNORMAL HIGH (ref 11.6–15.2)

## 2014-06-16 LAB — LACTIC ACID, PLASMA: Lactic Acid, Venous: 0.8 mmol/L (ref 0.5–2.2)

## 2014-06-16 MED ORDER — ALBUTEROL SULFATE HFA 108 (90 BASE) MCG/ACT IN AERS: 2.0000 | INHALATION_SPRAY | Freq: Four times a day (QID) | RESPIRATORY_TRACT | Status: DC | PRN

## 2014-06-16 MED ORDER — BACLOFEN 10 MG PO TABS
10.0000 mg | ORAL_TABLET | Freq: Three times a day (TID) | ORAL | Status: DC | PRN
  Filled 2014-06-16: qty 1

## 2014-06-16 MED ORDER — HEPARIN SOD (PORK) LOCK FLUSH 100 UNIT/ML IV SOLN
500.0000 [IU] | Freq: Once | INTRAVENOUS | Status: AC
  Administered 2014-06-16: 500 [IU]
  Filled 2014-06-16: qty 5

## 2014-06-16 MED ORDER — IBUPROFEN 200 MG PO TABS
600.0000 mg | ORAL_TABLET | Freq: Once | ORAL | Status: AC
  Administered 2014-06-16: 600 mg via ORAL
  Filled 2014-06-16: qty 3

## 2014-06-16 MED ORDER — SODIUM CHLORIDE 0.9 % IJ SOLN: 3.0000 mL | INTRAMUSCULAR | Status: DC | PRN

## 2014-06-16 MED ORDER — SODIUM CHLORIDE 0.9 % IJ SOLN
3.0000 mL | Freq: Two times a day (BID) | INTRAMUSCULAR | Status: DC
  Administered 2014-06-16 – 2014-06-18 (×5): 3 mL via INTRAVENOUS

## 2014-06-16 MED ORDER — SODIUM CHLORIDE 0.9 % IV SOLN: 250.0000 mL | INTRAVENOUS | Status: DC | PRN

## 2014-06-16 MED ORDER — ONDANSETRON HCL 4 MG/2ML IJ SOLN: 4.0000 mg | Freq: Four times a day (QID) | INTRAMUSCULAR | Status: DC | PRN

## 2014-06-16 MED ORDER — ONDANSETRON HCL 4 MG PO TABS: 4.0000 mg | ORAL_TABLET | Freq: Four times a day (QID) | ORAL | Status: DC | PRN

## 2014-06-16 MED ORDER — SODIUM CHLORIDE 0.9 % IV BOLUS (SEPSIS)
1000.0000 mL | Freq: Once | INTRAVENOUS | Status: AC
  Administered 2014-06-16: 1000 mL via INTRAVENOUS

## 2014-06-16 MED ORDER — ALBUTEROL SULFATE (2.5 MG/3ML) 0.083% IN NEBU: 2.5000 mg | INHALATION_SOLUTION | Freq: Four times a day (QID) | RESPIRATORY_TRACT | Status: DC | PRN

## 2014-06-16 MED ORDER — LOSARTAN POTASSIUM 50 MG PO TABS
100.0000 mg | ORAL_TABLET | Freq: Every morning | ORAL | Status: DC
  Administered 2014-06-17 – 2014-06-19 (×3): 100 mg via ORAL
  Filled 2014-06-16 (×3): qty 2

## 2014-06-16 MED ORDER — WARFARIN - PHARMACIST DOSING INPATIENT
Freq: Every day | Status: DC
  Administered 2014-06-16: 18:00:00

## 2014-06-16 MED ORDER — IOHEXOL 300 MG/ML  SOLN
80.0000 mL | Freq: Once | INTRAMUSCULAR | Status: AC | PRN
  Administered 2014-06-16: 80 mL via INTRAVENOUS

## 2014-06-16 MED ORDER — GABAPENTIN 300 MG PO CAPS
600.0000 mg | ORAL_CAPSULE | Freq: Two times a day (BID) | ORAL | Status: DC
  Administered 2014-06-16 – 2014-06-19 (×6): 600 mg via ORAL
  Filled 2014-06-16 (×7): qty 2

## 2014-06-16 MED ORDER — TRAMADOL HCL 50 MG PO TABS: 100.0000 mg | ORAL_TABLET | Freq: Four times a day (QID) | ORAL | Status: DC | PRN

## 2014-06-16 MED ORDER — ACETAMINOPHEN 325 MG PO TABS
650.0000 mg | ORAL_TABLET | Freq: Four times a day (QID) | ORAL | Status: DC | PRN
  Administered 2014-06-16: 650 mg via ORAL
  Filled 2014-06-16: qty 2

## 2014-06-16 MED ORDER — POTASSIUM CHLORIDE CRYS ER 10 MEQ PO TBCR
10.0000 meq | EXTENDED_RELEASE_TABLET | Freq: Two times a day (BID) | ORAL | Status: DC
  Administered 2014-06-16 – 2014-06-19 (×4): 10 meq via ORAL
  Filled 2014-06-16 (×7): qty 1

## 2014-06-16 MED ORDER — TEMAZEPAM 15 MG PO CAPS
30.0000 mg | ORAL_CAPSULE | Freq: Every evening | ORAL | Status: DC | PRN
  Administered 2014-06-16 – 2014-06-18 (×3): 30 mg via ORAL
  Filled 2014-06-16 (×3): qty 2

## 2014-06-16 MED ORDER — FUROSEMIDE 80 MG PO TABS
80.0000 mg | ORAL_TABLET | Freq: Every day | ORAL | Status: DC | PRN
  Filled 2014-06-16: qty 1

## 2014-06-16 MED ORDER — VANCOMYCIN HCL 10 G IV SOLR
2000.0000 mg | Freq: Once | INTRAVENOUS | Status: AC
  Administered 2014-06-16: 2000 mg via INTRAVENOUS
  Filled 2014-06-16: qty 2000

## 2014-06-16 MED ORDER — ENOXAPARIN SODIUM 40 MG/0.4ML ~~LOC~~ SOLN: 40.0000 mg | SUBCUTANEOUS | Status: DC

## 2014-06-16 MED ORDER — WARFARIN SODIUM 7.5 MG PO TABS
7.5000 mg | ORAL_TABLET | Freq: Once | ORAL | Status: AC
  Administered 2014-06-16: 7.5 mg via ORAL
  Filled 2014-06-16: qty 1

## 2014-06-16 MED ORDER — SPIRONOLACTONE 12.5 MG HALF TABLET
12.5000 mg | ORAL_TABLET | Freq: Every day | ORAL | Status: DC
  Administered 2014-06-16 – 2014-06-19 (×4): 12.5 mg via ORAL
  Filled 2014-06-16 (×4): qty 1

## 2014-06-16 MED ORDER — ACETAMINOPHEN 500 MG PO TABS: 1000.0000 mg | ORAL_TABLET | Freq: Four times a day (QID) | ORAL | Status: DC | PRN

## 2014-06-16 MED ORDER — VANCOMYCIN HCL 10 G IV SOLR
1250.0000 mg | Freq: Two times a day (BID) | INTRAVENOUS | Status: DC
  Administered 2014-06-16 – 2014-06-18 (×4): 1250 mg via INTRAVENOUS
  Filled 2014-06-16 (×5): qty 1250

## 2014-06-16 MED ORDER — ACETAMINOPHEN 650 MG RE SUPP: 650.0000 mg | Freq: Four times a day (QID) | RECTAL | Status: DC | PRN

## 2014-06-16 MED ORDER — ALPRAZOLAM 1 MG PO TABS
1.0000 mg | ORAL_TABLET | Freq: Three times a day (TID) | ORAL | Status: DC | PRN
  Administered 2014-06-16 – 2014-06-18 (×4): 1 mg via ORAL
  Filled 2014-06-16 (×4): qty 1

## 2014-06-16 MED ORDER — AMLODIPINE BESYLATE 10 MG PO TABS
10.0000 mg | ORAL_TABLET | Freq: Every morning | ORAL | Status: DC
  Administered 2014-06-17 – 2014-06-19 (×3): 10 mg via ORAL
  Filled 2014-06-16 (×3): qty 1

## 2014-06-16 NOTE — ED Notes (Signed)
Went to collect cultures from pt - doesn't want me to stick R side and she has an IV in left hand.  RN aware that the IV needs to be shut off in order for me to draw labs from left side.

## 2014-06-16 NOTE — ED Notes (Signed)
Pt on chemo for breast cancer.  States that she began having fever and body aches since yesterday.  Also c/o low bilateral abd pain.  Pt states that she had a temp of 103.5 this morning.  Took tylenol at 0730 this morning.  Denies NVD.

## 2014-06-16 NOTE — Progress Notes (Signed)
ANTICOAGULATION CONSULT NOTE - Initial Consult  Pharmacy Consult for Warfarin Indication: hx of DVT  Allergies  Allergen Reactions  . Adhesive [Tape] Other (See Comments)    Tears skin   . Penicillins Hives   Patient Measurements:   Total body weight: 132 kg  Vital Signs: Temp: 99.8 F (37.7 C) (10/21 1352) Temp Source: Oral (10/21 1352) BP: 132/74 mmHg (10/21 1352) Pulse Rate: 84 (10/21 1352)  Labs:  Recent Labs  06/16/14 1148  HGB 10.5*  HCT 30.0*  PLT 203  CREATININE 0.97   The CrCl is unknown because both a height and weight (above a minimum accepted value) are required for this calculation.  Medical History: Past Medical History  Diagnosis Date  . Breast cancer     mets to liver and lung  . Hypertension   . SVC syndrome   . History of chemotherapy Feb. 2006    taxotere/herceptin/carboplatin  . Radiation 07/31/2006    left upper chest  . Radiation 06/17/2006-06/27/2006    6480 cGy bilat. chest wall  . Neuropathy   . Thrombosis   . Breast cancer metastasized to multiple sites 02/26/2013   Medications:  Scheduled:   Anti-infectives   Start     Dose/Rate Route Frequency Ordered Stop   06/16/14 2200  vancomycin (VANCOCIN) 1,250 mg in sodium chloride 0.9 % 250 mL IVPB     1,250 mg 166.7 mL/hr over 90 Minutes Intravenous Every 12 hours 06/16/14 1453     06/16/14 1130  vancomycin (VANCOCIN) 2,000 mg in sodium chloride 0.9 % 500 mL IVPB     2,000 mg 250 mL/hr over 120 Minutes Intravenous  Once 06/16/14 1129 06/16/14 1408     Assessment: 23 yoF with hx of Stage IV Breast Ca, mets to liver, lung. Hx of DVT on chronic Warfarin. Presented to ED with acute onset of redness over R chest wall, shoulder and upper arm. Port-A-Cath on right, not accessed. Chemotherapy Regimen: Herceptin q 21 days, last cycle 31 received 10/8.  Last anti-coag clinic visit 10/8 with INR of 3.0 on Warfarin 7.5mg  daily exc 5mg  q Friday, last dose 10/20  Goal of Therapy:  INR 2-3    Plan:   Await baseline INR for Warfarin dose  Daily PT/INR ordered  Minda Ditto PharmD Pager [phone] 06/16/2014, 3:12 PM   Nyoka Cowden, Harrington Jobe L 06/16/2014,3:00 PM

## 2014-06-16 NOTE — ED Notes (Signed)
I take the influenza panel I collected to lab at this time; in other words, I walk it to the lab.  Pt. Tolerated this well.

## 2014-06-16 NOTE — ED Notes (Signed)
Patient transported to CT 

## 2014-06-16 NOTE — ED Notes (Signed)
Report given to Nurse 5E.

## 2014-06-16 NOTE — Progress Notes (Signed)
ANTIBIOTIC CONSULT NOTE - INITIAL  Pharmacy Consult for Vancomycin Indication: rule out Cellulitis  Allergies  Allergen Reactions  . Adhesive [Tape] Other (See Comments)    Tears skin   . Penicillins Hives   Patient Measurements:   Total body weight: 132 kg  Vital Signs: Temp: 99.8 F (37.7 C) (10/21 1352) Temp Source: Oral (10/21 1352) BP: 132/74 mmHg (10/21 1352) Pulse Rate: 84 (10/21 1352) Intake/Output from previous day:   Intake/Output from this shift:    Labs:  Recent Labs  06/16/14 1148  WBC 12.0*  HGB 10.5*  PLT 203  CREATININE 0.97   The CrCl is unknown because both a height and weight (above a minimum accepted value) are required for this calculation. No results found for this basename: VANCOTROUGH, VANCOPEAK, VANCORANDOM, GENTTROUGH, GENTPEAK, GENTRANDOM, TOBRATROUGH, TOBRAPEAK, TOBRARND, AMIKACINPEAK, AMIKACINTROU, AMIKACIN,  in the last 72 hours   Microbiology: No results found for this or any previous visit (from the past 720 hour(s)).  Medical History: Past Medical History  Diagnosis Date  . Breast cancer     mets to liver and lung  . Hypertension   . SVC syndrome   . History of chemotherapy Feb. 2006    taxotere/herceptin/carboplatin  . Radiation 07/31/2006    left upper chest  . Radiation 06/17/2006-06/27/2006    6480 cGy bilat. chest wall  . Neuropathy   . Thrombosis   . Breast cancer metastasized to multiple sites 02/26/2013   Medications:  Scheduled:  Anti-infectives   Start     Dose/Rate Route Frequency Ordered Stop   06/16/14 1130  vancomycin (VANCOCIN) 2,000 mg in sodium chloride 0.9 % 500 mL IVPB     2,000 mg 250 mL/hr over 120 Minutes Intravenous  Once 06/16/14 1129 06/16/14 1408     Assessment: 72 yoF with hx of Stage IV Breast Ca, mets to liver, lung. Hx of DVT on chronic Warfarin. Presented to ED with acute onset of redness over R chest wall, shoulder and upper arm. Port-A-Cath on right, not accessed. Chemotherapy Regimen:  Herceptin q 21 days, last cycle 31 received 10/8.  Febrile at home, temp 99.8 in ED  WBC 12K, c/o mild pain R shoulder  Begin empiric Vancomycin, 2gm dose in ED, pharmacy to dose  Goal of Therapy:  Vancomycin trough level 15-20 mcg/ml  Plan:   Will aim for higher trough with possible PAC involvement (trough of 10-15 mcg/ml usual for cellulitis)  Plan chest CT  Vancomycin 1250mg  q12  Minda Ditto PharmD Pager [phone] 06/16/2014, 2:56 PM

## 2014-06-16 NOTE — Progress Notes (Signed)
UR completed 

## 2014-06-16 NOTE — Progress Notes (Signed)
Patient admitted to [facility] alert and oriented, transferred from ED, transferred in wheelchair, tolerated well, family at bedside, report taken via telephone, BP=130/65, P= 77, Temp= 99.2 orally, Sats=100 on RA, skin dry and intact with reddish area at right shoulder, warm to touch, voiding clear yellow urine, no pain at this time, Dr notified of patient's arrival to unit, patient in stable condition at this time

## 2014-06-16 NOTE — Progress Notes (Signed)
ANTICOAGULATION CONSULT NOTE - Follow Up   Pharmacy Consult for Warfarin  Indication: hx of DVT  See progress note from Graylin Shiver, PharmD for full details.  INR = 2.08 which is therapeutic  Plan:  Warfarin 7.5mg  PO x 1 tonight per home dose  Daily PT/INR  Peggyann Juba, PharmD, BCPS 06/16/2014 4:32 PM

## 2014-06-16 NOTE — ED Provider Notes (Signed)
CSN: [id number]     Arrival date & time 06/16/14  4128 History   First MD Initiated Contact with Patient 06/16/14 1022     Chief Complaint  Patient presents with  . Cancer  . Fever  . Abdominal Pain     (Consider location/radiation/quality/duration/timing/severity/associated sxs/prior Treatment) HPI  63yF with fever ad body aches. Onset yesterday. Progressive since. Feels achy all over. No cough. No SOB. No urinary complaints. No v/d. Rash to RUE and chest which she actually just noticed while changes into hospital gown. Hx of breast CA s/p mastectomy. R sided port.   Past Medical History  Diagnosis Date  . Breast cancer     mets to liver and lung  . Hypertension   . SVC syndrome   . History of chemotherapy Feb. 2006    taxotere/herceptin/carboplatin  . Radiation 07/31/2006    left upper chest  . Radiation 06/17/2006-06/27/2006    6480 cGy bilat. chest wall  . Neuropathy   . Thrombosis   . Breast cancer metastasized to multiple sites 02/26/2013   Past Surgical History  Procedure Laterality Date  . Tubal ligation  1986  . Cholecystectomy  1989  . Mastectomy Bilateral   . Ankle surgery    . Peripherally inserted central catheter insertion    . Back surgery     Family History  Problem Relation Age of Onset  . Heart failure Father   . Cancer Father     Prostate cancer  . Heart failure Brother   . Cancer Brother     Prostate cancer  . Diabetes Maternal Aunt    History  Substance Use Topics  . Smoking status: Never Smoker   . Smokeless tobacco: Never Used  . Alcohol Use: Yes     Comment: occasional   OB History   Grav Para Term Preterm Abortions TAB SAB Ect Mult Living                 Review of Systems  All systems reviewed and negative, other than as noted in HPI.   Allergies  Adhesive and Penicillins  Home Medications   Prior to Admission medications   Medication Sig Start Date End Date Taking? Authorizing Provider  acetaminophen (TYLENOL) 500 MG  tablet Take 1,000 mg by mouth every 6 (six) hours as needed for mild pain or fever.    Yes Historical Provider, MD  albuterol (PROVENTIL HFA;VENTOLIN HFA) 108 (90 BASE) MCG/ACT inhaler Inhale 2 puffs into the lungs every 6 (six) hours as needed for wheezing. 03/04/14  Yes Chauncey Cruel, MD  ALPRAZolam Duanne Moron) 1 MG tablet Take 1 mg by mouth 3 (three) times daily as needed for anxiety.   Yes Historical Provider, MD  amLODipine (NORVASC) 10 MG tablet Take 10 mg by mouth every morning.   Yes Historical Provider, MD  baclofen (LIORESAL) 10 MG tablet Take 10 mg by mouth 3 (three) times daily as needed for muscle spasms.  10/05/13  Yes Historical Provider, MD  diclofenac sodium (VOLTAREN) 1 % GEL Apply 2 g topically daily as needed (for pain). Apply to knees and shoulders 08/03/13  Yes Chauncey Cruel, MD  furosemide (LASIX) 80 MG tablet Take 80 mg by mouth daily as needed for fluid.    Yes Historical Provider, MD  gabapentin (NEURONTIN) 300 MG capsule Take 600 mg by mouth 2 (two) times daily.  09/14/13  Yes Chauncey Cruel, MD  losartan (COZAAR) 100 MG tablet Take 100 mg by mouth every morning.  Yes Historical Provider, MD  potassium chloride (K-DUR,KLOR-CON) 10 MEQ tablet Take 10 mEq by mouth 2 (two) times daily.   Yes Historical Provider, MD  [place]   Yes Historical Provider, MD  prochlorperazine (COMPAZINE) 10 MG tablet Take 1 tablet (10 mg total) by mouth every 6 (six) hours as needed (for nausea). 07/24/13  Yes Chauncey Cruel, MD  spironolactone (ALDACTONE) 25 MG tablet Take 0.5 tablets (12.5 mg total) by mouth daily. 08/14/13  Yes Chauncey Cruel, MD  temazepam (RESTORIL) 30 MG capsule Take 1 capsule (30 mg total) by mouth at bedtime as needed for sleep. 05/28/14  Yes Chauncey Cruel, MD  traMADol (ULTRAM) 50 MG tablet Take 2 tablets (100 mg total) by mouth every 6 (six) hours as needed. 05/06/14  Yes Chauncey Cruel, MD  warfarin (COUMADIN) 5 MG tablet Take  5-7.5 mg by mouth See admin instructions. Takes 7.5mg  everyday except 5mg  on Friday's   Yes Historical Provider, MD   BP 137/78  Pulse 105  Temp(Src) 99.5 F (37.5 C) (Oral)  Resp 20  SpO2 94% Physical Exam  Nursing note and vitals reviewed. Constitutional: She appears well-developed and well-nourished. No distress.  HENT:  Head: Normocephalic and atraumatic.  Eyes: Conjunctivae are normal. Right eye exhibits no discharge. Left eye exhibits no discharge.  Neck: Neck supple.  Cardiovascular: Normal rate, regular rhythm and normal heart sounds.  Exam reveals no gallop and no friction rub.   No murmur heard. Pulmonary/Chest: Effort normal and breath sounds normal. No respiratory distress.  Abdominal: Soft. She exhibits no distension. There is no tenderness.  Musculoskeletal: She exhibits no edema and no tenderness.  Neurological: She is alert.  Skin: Skin is warm and dry. Rash noted. There is erythema.  Erythema/warmth/induration RUE, R shoulder and R chest wall consistent with cellulitis. Extends over R sided port. B/l mastectomy. No axillary nodes palpated.   Psychiatric: She has a normal mood and affect. Her behavior is normal. Thought content normal.    ED Course  Procedures (including critical care time) Labs Review Labs Reviewed  CBC WITH DIFFERENTIAL - Abnormal; Notable for the following:    WBC 12.0 (*)    RBC 3.86 (*)    Hemoglobin 10.5 (*)    HCT 30.0 (*)    MCV 77.7 (*)    Neutrophils Relative % 82 (*)    Neutro Abs 9.8 (*)    All other components within normal limits  BASIC METABOLIC PANEL - Abnormal; Notable for the following:    Glucose, Bld 116 (*)    GFR calc non Af Amer 61 (*)    GFR calc Af Amer 71 (*)    All other components within normal limits  LACTIC ACID, PLASMA  INFLUENZA PANEL BY PCR (TYPE A & B, H1N1)  URINALYSIS, ROUTINE W REFLEX MICROSCOPIC    Imaging Review Dg Chest 2 View  06/16/2014   CLINICAL DATA:  Cancer.  Fever.  Abdominal pain   EXAM: CHEST  2 VIEW  COMPARISON:  06/01/2014  FINDINGS: Right chest wall port a catheter is noted with tip in the projection of the cavoatrial junction. The heart size appears mildly enlarged. There is no pleural effusion or edema. No airspace consolidation identified.  IMPRESSION: 1. Mild cardiac enlargement.   Electronically Signed   By: Kerby Moors M.D.   On: 06/16/2014 11:21     EKG Interpretation None      MDM   Final diagnoses:  Cellulitis of chest  wall    63yF with fever. Likely from cellulitis of chest, RUE. Unfortunately her port was accessed through affected area. Discussed with nursing. PIV established and port deaccessed. Port site itself not appreciably tender. Skin changes in this area essentially the same as surrounding area.   Admit for further IV abx.     Virgel Manifold, MD 06/21/14 [phone]

## 2014-06-16 NOTE — H&P (Addendum)
[facility] History and Physical  RAYLEIGH GILLYARD FGH:[id number] DOB: 03-28-1951 DOA: 06/16/2014  Referring physician: Dr. Wilson Singer PCP: Ernestene Kiel, MD   Chief Complaint:  Fever x1 day Redness over right anterior chest and right arm since one day  HPI:  63 year old female with history of stage IV metastatic breast cancer to liver and lung currently undergoing chemotherapy through right-sided Port-A-Cath, history of mastectomy and radiation, CHF (normal EF of 50-60% in 02/9014, followed by Dr. Missy Sabins),  hypertension, history of DVT on Coumadin who presented to the ED with acute onset of redness over her right anterior chest wall overlying the Port-A-Cath site spread towards her right shoulder and upper arm. She reports having a fever of 103.64F yesterday evening. She denies any headache, blurred vision, dizziness, runny nose, congestion, cough, any discharge from the port.  She denies any chills, nausea , vomiting, chest pain, palpitations, SOB, abdominal pain, bowel or urinary symptoms. She reports that the report was lost used one week back for chemotherapy. She denies any insect bite. She reports having mild pain over her right shoulder this morning. Denies any recent travel or sick contact.  Course in the ED patient had a low-grade temperature of 99.37F. Otherwise vitals were normal. Will done showed WBC of 12, hemoglobin of 10.5 and platelets of 203. Chemistry was unremarkable. Glucose of 116. Chest x-ray was unremarkable as well. Patient Port-A-Cath was accessed accidentally in the ED and was subsequently the accessed. Patient given a dose of empiric IV vancomycin and hospitalist admission requested.   Review of Systems:  Constitutional: fever, chills, denies diaphoresis, appetite change and fatigue.  HEENT: Denies photophobia, eye pain,  ear pain, congestion, sore throat, rhinorrhea, sneezing, mouth sores, trouble swallowing, neck pain, neck stiffness  Respiratory: Denies  SOB, DOE, cough, chest tightness,  and wheezing.   Cardiovascular: Denies chest pain, palpitations and leg swelling.  Gastrointestinal: Denies nausea, vomiting, abdominal pain, diarrhea, constipation, blood in stool and abdominal distention.  Genitourinary: Denies dysuria, urgency, frequency, hematuria, flank pain and difficulty urinating.  Endocrine: Denies: hot or cold intolerance,  polyuria, polydipsia. Musculoskeletal:  Denies myalgias, back pain, joint pain Skin: Erythema and warmth over right arm, shoulder and anterior chest, Neurological: Denies dizziness, seizures, syncope, weakness, light-headedness, numbness and headaches.  Hematological: Denies adenopathy. Psychiatric/Behavioral: Denies confusion,   Past Medical History  Diagnosis Date  . Breast cancer     mets to liver and lung  . Hypertension   . SVC syndrome   . History of chemotherapy Feb. 2006    taxotere/herceptin/carboplatin  . Radiation 07/31/2006    left upper chest  . Radiation 06/17/2006-06/27/2006    6480 cGy bilat. chest wall  . Neuropathy   . Thrombosis   . Breast cancer metastasized to multiple sites 02/26/2013   Past Surgical History  Procedure Laterality Date  . Tubal ligation  1986  . Cholecystectomy  1989  . Mastectomy Bilateral   . Ankle surgery    . Peripherally inserted central catheter insertion    . Back surgery     Social History:  reports that she has never smoked. She has never used smokeless tobacco. She reports that she drinks alcohol. She reports that she does not use illicit drugs.  Allergies  Allergen Reactions  . Adhesive [Tape] Other (See Comments)    Tears skin   . Penicillins Hives    Family History  Problem Relation Age of Onset  . Heart failure Father   . Cancer Father     Prostate cancer  .  Heart failure Brother   . Cancer Brother     Prostate cancer  . Diabetes Maternal Aunt     Prior to Admission medications   Medication Sig Start Date End Date Taking?  Authorizing Provider  acetaminophen (TYLENOL) 500 MG tablet Take 1,000 mg by mouth every 6 (six) hours as needed for mild pain or fever.    Yes Historical Provider, MD  albuterol (PROVENTIL HFA;VENTOLIN HFA) 108 (90 BASE) MCG/ACT inhaler Inhale 2 puffs into the lungs every 6 (six) hours as needed for wheezing. 03/04/14  Yes Chauncey Cruel, MD  ALPRAZolam Duanne Moron) 1 MG tablet Take 1 mg by mouth 3 (three) times daily as needed for anxiety.   Yes Historical Provider, MD  amLODipine (NORVASC) 10 MG tablet Take 10 mg by mouth every morning.   Yes Historical Provider, MD  baclofen (LIORESAL) 10 MG tablet Take 10 mg by mouth 3 (three) times daily as needed for muscle spasms.  10/05/13  Yes Historical Provider, MD  diclofenac sodium (VOLTAREN) 1 % GEL Apply 2 g topically daily as needed (for pain). Apply to knees and shoulders 08/03/13  Yes Chauncey Cruel, MD  furosemide (LASIX) 80 MG tablet Take 80 mg by mouth daily as needed for fluid.    Yes Historical Provider, MD  gabapentin (NEURONTIN) 300 MG capsule Take 600 mg by mouth 2 (two) times daily.  09/14/13  Yes Chauncey Cruel, MD  losartan (COZAAR) 100 MG tablet Take 100 mg by mouth every morning.   Yes Historical Provider, MD  potassium chloride (K-DUR,KLOR-CON) 10 MEQ tablet Take 10 mEq by mouth 2 (two) times daily.   Yes Historical Provider, MD  [place]   Yes Historical Provider, MD  prochlorperazine (COMPAZINE) 10 MG tablet Take 1 tablet (10 mg total) by mouth every 6 (six) hours as needed (for nausea). 07/24/13  Yes Chauncey Cruel, MD  spironolactone (ALDACTONE) 25 MG tablet Take 0.5 tablets (12.5 mg total) by mouth daily. 08/14/13  Yes Chauncey Cruel, MD  temazepam (RESTORIL) 30 MG capsule Take 1 capsule (30 mg total) by mouth at bedtime as needed for sleep. 05/28/14  Yes Chauncey Cruel, MD  traMADol (ULTRAM) 50 MG tablet Take 2 tablets (100 mg total) by mouth every 6 (six) hours as needed. 05/06/14  Yes Chauncey Cruel, MD  warfarin (COUMADIN) 5 MG tablet Take 5-7.5 mg by mouth See admin instructions. Takes 7.5mg  everyday except 5mg  on Friday's   Yes Historical Provider, MD     Physical Exam:  Filed Vitals:   06/16/14 1019 06/16/14 1352  BP: 137/78 132/74  Pulse: 105 84  Temp: 99.5 F (37.5 C) 99.8 F (37.7 C)  TempSrc: Oral Oral  Resp: 20 20  SpO2: 94% 97%    Constitutional: Vital signs reviewed.  Middle aged obese female no acute distress. HEENT: no pallor, no icterus, moist oral mucosa, no cervical lymphadenopathy Cardiovascular: RRR, S1 normal, S2 normal, no MRG Chest: , Erythema over her right anterior chest close to midline involving middle one third of the chest radiating to the right shoulder and upper right arm anteriorly with warmth but no tenderness. No axillary lymphadenopathy. The cath site appears clean with no discharge, tenderness or fluctuation. Status post bilateral mastectomy, no discharge noted ,CTAB, no wheezes, rales, or rhonchi Abdominal: Soft. Non-tender, non-distended, bowel sounds are normal,  Ext: warm, no edema, no right shoulder tenderness with normal range of motion Neurological: Alert and oriented  Labs on Admission:  Basic Metabolic Panel:  Recent Labs Lab 06/16/14 1148  NA 141  K 3.9  CL 105  CO2 23  GLUCOSE 116*  BUN 14  CREATININE 0.97  CALCIUM 8.9   Liver Function Tests: No results found for this basename: AST, ALT, ALKPHOS, BILITOT, PROT, ALBUMIN,  in the last 168 hours No results found for this basename: LIPASE, AMYLASE,  in the last 168 hours No results found for this basename: AMMONIA,  in the last 168 hours CBC:  Recent Labs Lab 06/16/14 1148  WBC 12.0*  NEUTROABS 9.8*  HGB 10.5*  HCT 30.0*  MCV 77.7*  PLT 203   Cardiac Enzymes: No results found for this basename: CKTOTAL, CKMB, CKMBINDEX, TROPONINI,  in the last 168 hours BNP: No components found with this basename: POCBNP,  CBG: No results found for this basename:  GLUCAP,  in the last 168 hours  Radiological Exams on Admission: Dg Chest 2 View  06/16/2014   CLINICAL DATA:  Cancer.  Fever.  Abdominal pain  EXAM: CHEST  2 VIEW  COMPARISON:  06/01/2014  FINDINGS: Right chest wall port a catheter is noted with tip in the projection of the cavoatrial junction. The heart size appears mildly enlarged. There is no pleural effusion or edema. No airspace consolidation identified.  IMPRESSION: 1. Mild cardiac enlargement.   Electronically Signed   By: Kerby Moors M.D.   On: 06/16/2014 11:21      Assessment/Plan  Principal Problem:   Cellulitis of chest wall and right upper arm Admit to MedSurg Continue empiric vancomycin. Blood cultures ordered. Port-A-Cath has been deaccessed. -I will check CT scan of the chest with contrast to rule out any underlying abscess or involvement of the port.  -Continue home pain medications. -Low likelihood of DVT of the right upper extremity. Check INR and if subtherapeutic will order Doppler of right upper arm. Her recent PET scan done did not show any new lesions except for 2 lymph nodse on the left.  Active Problems:   CHF (congestive heart failure) Appears euvolemic. Last echo from July 2015 with normal EF. Appears euvolemic. Continue lisinopril, Aldactone and as needed Lasix    Breast cancer metastasized to multiple sites Currently receiving chemotherapy. Follows with Dr. Jana Hakim . I would add him as a consult.    Anemia of chronic disease Stable.  History of DVT On Coumadin. Check INR. Dosing per pharmacy   Diet:cardiac  DVT prophylaxis: coumadin   Code Status: full code Family Communication: discussed with  patient and her husband at bedside Disposition Plan: We will once improved  Aritza Brunet, [place] [facility] Pager [phone]  Total time spent on admission :60 minutes  If 7PM-7AM, please contact night-coverage [email or web address] Password [email or web address] 06/16/2014, 2:15 PM

## 2014-06-17 ENCOUNTER — Encounter (HOSPITAL_COMMUNITY): Payer: Commercial Managed Care - HMO

## 2014-06-17 ENCOUNTER — Ambulatory Visit (HOSPITAL_COMMUNITY): Payer: Medicare HMO

## 2014-06-17 ENCOUNTER — Other Ambulatory Visit: Payer: Self-pay | Admitting: Oncology

## 2014-06-17 DIAGNOSIS — I509 Heart failure, unspecified: Secondary | ICD-10-CM

## 2014-06-17 DIAGNOSIS — I871 Compression of vein: Secondary | ICD-10-CM

## 2014-06-17 DIAGNOSIS — Z7901 Long term (current) use of anticoagulants: Secondary | ICD-10-CM

## 2014-06-17 DIAGNOSIS — G8929 Other chronic pain: Secondary | ICD-10-CM

## 2014-06-17 DIAGNOSIS — I82409 Acute embolism and thrombosis of unspecified deep veins of unspecified lower extremity: Secondary | ICD-10-CM

## 2014-06-17 DIAGNOSIS — C50819 Malignant neoplasm of overlapping sites of unspecified female breast: Secondary | ICD-10-CM

## 2014-06-17 DIAGNOSIS — C787 Secondary malignant neoplasm of liver and intrahepatic bile duct: Secondary | ICD-10-CM

## 2014-06-17 DIAGNOSIS — C7951 Secondary malignant neoplasm of bone: Secondary | ICD-10-CM

## 2014-06-17 LAB — BASIC METABOLIC PANEL
Anion gap: 10 (ref 5–15)
BUN: 12 mg/dL (ref 6–23)
CO2: 23 meq/L (ref 19–32)
CREATININE: 0.85 mg/dL (ref 0.50–1.10)
Calcium: 8.8 mg/dL (ref 8.4–10.5)
Chloride: 108 mEq/L (ref 96–112)
GFR calc Af Amer: 83 mL/min — ABNORMAL LOW (ref >90)
GFR calc non Af Amer: 72 mL/min — ABNORMAL LOW (ref >90)
Glucose, Bld: 103 mg/dL — ABNORMAL HIGH (ref 70–99)
Potassium: 3.7 mEq/L (ref 3.7–5.3)
SODIUM: 141 meq/L (ref 137–147)

## 2014-06-17 LAB — CBC
HCT: 30.2 % — ABNORMAL LOW (ref 36.0–46.0)
HEMOGLOBIN: 10.4 g/dL — AB (ref 12.0–15.0)
MCH: 27.4 pg (ref 26.0–34.0)
MCHC: 34.4 g/dL (ref 30.0–36.0)
MCV: 79.5 fL (ref 78.0–100.0)
Platelets: 198 10*3/uL (ref 150–400)
RBC: 3.8 MIL/uL — AB (ref 3.87–5.11)
RDW: 15.6 % — ABNORMAL HIGH (ref 11.5–15.5)
WBC: 10.8 10*3/uL — ABNORMAL HIGH (ref 4.0–10.5)

## 2014-06-17 LAB — CLOSTRIDIUM DIFFICILE BY PCR: CDIFFPCR: NEGATIVE

## 2014-06-17 LAB — PROTIME-INR
INR: 2.2 — ABNORMAL HIGH (ref 0.00–1.49)
PROTHROMBIN TIME: 24.6 s — AB (ref 11.6–15.2)

## 2014-06-17 MED ORDER — WARFARIN SODIUM 7.5 MG PO TABS
7.5000 mg | ORAL_TABLET | Freq: Once | ORAL | Status: AC
  Administered 2014-06-17: 7.5 mg via ORAL
  Filled 2014-06-17: qty 1

## 2014-06-17 NOTE — Progress Notes (Signed)
ANTICOAGULATION CONSULT NOTE - follow up  Pharmacy Consult for Warfarin Indication: hx of DVT  Allergies  Allergen Reactions  . Adhesive [Tape] Other (See Comments)    Tears skin   . Penicillins Hives   Patient Measurements: Height: 5\' 3"  (160 cm) Weight: 287 lb 9.6 oz (130.455 kg) IBW/kg (Calculated) : 52.4 Total body weight: 132 kg  Vital Signs: Temp: 98.1 F (36.7 C) (10/22 0517) Temp Source: Oral (10/22 0517) BP: 151/94 mmHg (10/22 0517) Pulse Rate: 91 (10/22 0517)  Labs:  Recent Labs  06/16/14 1148 06/16/14 1554 06/17/14 0510  HGB 10.5*  --  10.4*  HCT 30.0*  --  30.2*  PLT 203  --  198  LABPROT  --  23.6* 24.6*  INR  --  2.08* 2.20*  CREATININE 0.97  --  0.85   Estimated Creatinine Clearance: 90.6 ml/min (by C-G formula based on Cr of 0.85).  Medical History: Past Medical History  Diagnosis Date  . Breast cancer     mets to liver and lung  . Hypertension   . SVC syndrome   . History of chemotherapy Feb. 2006    taxotere/herceptin/carboplatin  . Radiation 07/31/2006    left upper chest  . Radiation 06/17/2006-06/27/2006    6480 cGy bilat. chest wall  . Neuropathy   . Thrombosis   . Breast cancer metastasized to multiple sites 02/26/2013   Medications:  Scheduled:  . amLODipine  10 mg Oral q morning - 10a  . gabapentin  600 mg Oral BID  . losartan  100 mg Oral q morning - 10a  . potassium chloride  10 mEq Oral BID  . sodium chloride  3 mL Intravenous Q12H  . spironolactone  12.5 mg Oral Daily  . vancomycin  1,250 mg Intravenous Q12H  . warfarin  7.5 mg Oral ONCE-1800  . Warfarin - Pharmacist Dosing Inpatient   Does not apply q1800   Anti-infectives   Start     Dose/Rate Route Frequency Ordered Stop   06/16/14 2200  vancomycin (VANCOCIN) 1,250 mg in sodium chloride 0.9 % 250 mL IVPB     1,250 mg 166.7 mL/hr over 90 Minutes Intravenous Every 12 hours 06/16/14 1453     06/16/14 1130  vancomycin (VANCOCIN) 2,000 mg in sodium chloride 0.9 % 500 mL  IVPB     2,000 mg 250 mL/hr over 120 Minutes Intravenous  Once 06/16/14 1129 06/16/14 1408     Assessment: 63 yoF with hx of Stage IV Breast Ca, mets to liver, lung. Hx of DVT on chronic Warfarin. Presented to ED with acute onset of redness over R chest wall, shoulder and upper arm. Port-A-Cath on right, not accessed. Chemotherapy Regimen: Herceptin q 21 days, last cycle 31 received 10/8. Last anti-coag clinic visit 10/8 with INR of 3.0  Home dose 7.5mg  daily except 5mg  on Fridays  INR today 2.20  Hgb 10.4  Full diet ordered  Goal of Therapy:  INR 2-3   Plan:   Continue with home dose regiment warfarin 7.5mg  x 1 at 1800 tonight  Daily PT/INR ordered  Dolly Rias RPh 06/17/2014, 7:55 AM Pager [phone]

## 2014-06-17 NOTE — Consult Note (Signed)
[facility]  [phone] [phone]     ID: [name] DOB: [birth year]  MR#: [id number]  QMG#:[id number]  Patient Care Team: Yolanda Kiel, MD as PCP - General (Internal Medicine) [name]. Yolanda Simpson, MD as Consulting Physician (Pain Medicine) PCP: Yolanda Kiel, MD GYN: SU:  OTHER MD:  CHIEF COMPLAINT: R UE cellulitis  CURRENT TREATMENT: IV vancomycin   BREAST CANCER HISTORY: From the earlier summary:  [name] is 63 years old [place] female. This woman has been in good health all of her life. She noted a swelling and discomfort in her right breast in June 2004. She was seen in the Emergency Room in [place] and was treated for mastitis. She was treated for a number of months with mastitis and the swelling did not get better. She was given hydrocodone and Cipro. Finally, the swelling did get better and ultimately the nipple became retracted and she noticed some dimpling in her skin. She had a mammogram in July of 2004 in [place] with subsequent mammogram on May 27, 2003, by Dr. Isaiah Davis. Mammogram done on September 30 showed marked increased density in the left breast. Biopsy was performed the same day. It was noted at the 12 o'clock position, deep in the breast was a focal hypoechoic mass, at least 3.5 cm in diameter. Biopsy did in fact show invasive in situ mammary carcinoma. This was felt to be both at least intermediate, high grade. No definite lymphovascular invasion was identified. ER and PR negative, Her2 testing positive. [name] continues to have pain in her breast. She continues to take hydrocodone a number of times a day. She has been seen by Dr. Rosana Davis, who felt that neoadjuvant chemotherapy would be required.  Initial staging studies showed evidence of liver and lung mets. Patient also has evidence of bone lesions. Patient started neoadjuvant chemotherapy, Taxotere/Carbo/Herceptin in October 2004. Patient had a CT scan in  December 2004 which demonstrated extensive clot in the SVC innominate vein, bilateral jugular vein and She was started on anticoagulation therapy. She received a total of 6 cycles of Taxotere/Carbo/Herceptin, completed in April 2005." Patient has been on trastuzumab continued indefinitely; has also received lapatinib and capecitabine for variable intervals in 2007-2008. Most recent echo 12/01/2013 showed an ejection fraction of 55%. She is status post bilateral mastectomies with bilateral axillary lymph node dissection 12/07/2004, showing (a) on the right, a mypT1c ypN1 invasive ductal carcinoma, grade 3, estrogen and progesterone receptor negative, HER-2 positive, with an MIB-1 of 31% (b) on the left, ypT2 ypN1 invasive ductal carcinoma, grade 2, estrogen and progesterone receptor negative, HER-2 positive, with an MIB-1 of 35%. She is Status post radiation June through July of 2006, to the right chest wall, left chest wall, bilateral supraclavicular fossae, and bilateral axillary boosts; with additional radiation to the right and left chest walls and the central chest wall completed November of 2007. She is status post ixempra x9 completed August of 2009. 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 Patient has chronic pain, with negative PET scan 08/24/2013 (no evidence of active cancer). On Neurontin and Tramadol therapy.  Her subsequent history is as detailed below  INTERVAL HISTORY: [name] developed a temp >103 06/15/2014. Her family insisted on her coming to the hospital but she took a tylenol instead and that lowered her temperature some. Next day however the temp was again >103 and she agreed to ED eval. She was found to have a  RUE cellulitis (the patient was not aware of this) and admitted. She is currently day 2 empiric IV vancomycin. She has been AF past 24 h  REVIEW OF SYSTEMS: Denies h/a, nausea, vomiting; had  some blurred vision and dizzyness when febrile, not since. No cough, phlegm, SOB, no diarrhea or change in bowel habits, no dysuria or hematuria.  PAST MEDICAL HISTORY: Past Medical History  Diagnosis Date  . Breast cancer     mets to liver and lung  . Hypertension   . SVC syndrome   . History of chemotherapy Feb. 2006    taxotere/herceptin/carboplatin  . Radiation 07/31/2006    left upper chest  . Radiation 06/17/2006-06/27/2006    6480 cGy bilat. chest wall  . Neuropathy   . Thrombosis   . Breast cancer metastasized to multiple sites 02/26/2013    PAST SURGICAL HISTORY: Past Surgical History  Procedure Laterality Date  . Tubal ligation  1986  . Cholecystectomy  1989  . Mastectomy Bilateral   . Ankle surgery    . Peripherally inserted central catheter insertion    . Back surgery      FAMILY HISTORY Family History  Problem Relation Age of Onset  . Heart failure Father   . Cancer Father     Prostate cancer  . Heart failure Brother   . Cancer Brother     Prostate cancer  . Diabetes Maternal Aunt   She had three brothers, one died of gunshot wound, one of complications of diabetes mellitus and one of myocardial infarction. She has no sisters. Mother died of complications of brain metastasis in 31. Father has had a myocardial infarction in 1999. No history of breast or ovarian cancer in the family.    GYNECOLOGIC HISTORY:  No LMP recorded. Patient is postmenopausal. Menarche at age 63. Gravida 3, para 3. First live birth at age 63. No history of breast feeding. No history of hormonal replacement therapy.   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 Her husband used to work as a Art gallery manager, but is now retired. She has three children, [name] who lives in [place] and works as a Hydrographic surveyor, [name] who lives in [place] and works as a Administrator, and [name] who lives in [place] and also works as a  Hydrographic surveyor. The patient has 12 grandchildren and 4 great-grandchildren. She attends a [employer]. Very involved with school kids.     ADVANCED DIRECTIVES: not in place   HEALTH MAINTENANCE: History  Substance Use Topics  . Smoking status: Never Smoker   . Smokeless tobacco: Never Used  . Alcohol Use: Yes     Comment: occasional     Colonoscopy:  PAP:  Bone density:  Lipid panel:  Allergies  Allergen Reactions  . Adhesive [Tape] Other (See Comments)    Tears skin   . Penicillins Hives    Current Facility-Administered Medications  Medication Dose Route Frequency Provider Last Rate Last Dose  . 0.9 %  sodium chloride infusion  250 mL Intravenous PRN Nishant Dhungel, MD      . acetaminophen (TYLENOL) tablet 650 mg  650 mg Oral Q6H PRN Nishant Dhungel, MD   650 mg at 06/16/14 2128   Or  . acetaminophen (TYLENOL) suppository 650 mg  650 mg Rectal Q6H PRN Nishant Dhungel, MD      . albuterol (PROVENTIL) (2.5 MG/3ML) 0.083% nebulizer solution 2.5 mg  2.5 mg  Nebulization Q6H PRN Nishant Dhungel, MD      . ALPRAZolam Duanne Moron) tablet 1 mg  1 mg Oral TID PRN Nishant Dhungel, MD   1 mg at 06/17/14 1724  . amLODipine (NORVASC) tablet 10 mg  10 mg Oral q morning - 10a Nishant Dhungel, MD   10 mg at 06/17/14 1051  . baclofen (LIORESAL) tablet 10 mg  10 mg Oral TID PRN Nishant Dhungel, MD      . furosemide (LASIX) tablet 80 mg  80 mg Oral Daily PRN Nishant Dhungel, MD      . gabapentin (NEURONTIN) capsule 600 mg  600 mg Oral BID Nishant Dhungel, MD   600 mg at 06/17/14 1053  . losartan (COZAAR) tablet 100 mg  100 mg Oral q morning - 10a Nishant Dhungel, MD   100 mg at 06/17/14 1052  . ondansetron (ZOFRAN) tablet 4 mg  4 mg Oral Q6H PRN Nishant Dhungel, MD       Or  . ondansetron (ZOFRAN) injection 4 mg  4 mg Intravenous Q6H PRN Nishant Dhungel, MD      . potassium chloride (K-DUR,KLOR-CON) CR tablet 10 mEq  10 mEq Oral BID Nishant Dhungel, MD   10 mEq at 06/17/14 1050  . sodium  chloride 0.9 % injection 3 mL  3 mL Intravenous Q12H Nishant Dhungel, MD   3 mL at 06/17/14 1103  . sodium chloride 0.9 % injection 3 mL  3 mL Intravenous PRN Nishant Dhungel, MD      . spironolactone (ALDACTONE) tablet 12.5 mg  12.5 mg Oral Daily Nishant Dhungel, MD   12.5 mg at 06/17/14 1049  . temazepam (RESTORIL) capsule 30 mg  30 mg Oral QHS PRN Nishant Dhungel, MD   30 mg at 06/16/14 2132  . traMADol (ULTRAM) tablet 100 mg  100 mg Oral Q6H PRN Nishant Dhungel, MD      . vancomycin (VANCOCIN) 1,250 mg in sodium chloride 0.9 % 250 mL IVPB  1,250 mg Intravenous Q12H Terri L Green, RPH   1,250 mg at 06/17/14 1104  . Warfarin - Pharmacist Dosing Inpatient   Does not apply q1800 Emiliano Dyer, [facility]       Facility-Administered Medications Ordered in Other Encounters  Medication Dose Route Frequency Provider Last Rate Last Dose  . sodium chloride 0.9 % injection 10 mL  10 mL Intravenous PRN Chauncey Cruel, MD   10 mL at 12/17/13 1103    OBJECTIVE: middle aged African American woman examined in bed Filed Vitals:   06/17/14 1300  BP: 142/73  Pulse: 81  Temp: 98.4 F (36.9 C)  Resp: 19     Body mass index is 50.96 kg/(m^2).    ECOG FS:2 - Symptomatic, <50% confined to bed  Ocular: Sclerae unicteric,EOMs intact Ear-nose-throat: Oropharynx clear., slightly dry Lymphatic: No cervical or supraclavicular adenopathy Lungs no rales or rhonchi, auscultated anterolaterally Heart regular rate and rhythm Abd soft, nontender, positive bowel sounds MSK no focal spinal tenderness, grade 1 RUE edema, chronic Neuro: non-focal, well-oriented, positive affect Breasts: s/p bilateral mastectomies Skin: the rash is confluent, erythematous and nonpalpable; it is not suggestive of dermal recurrence of breast cancer (which would be palpable); port site is nontender and not swollen   LAB RESULTS:  CMP     Component Value Date/Time   NA 141 06/17/2014 0510   NA 141 06/03/2014 0822   K 3.7 06/17/2014  0510   K 3.8 06/03/2014 0822   CL 108 06/17/2014 0510   CL  104 01/30/2013 0850   CO2 23 06/17/2014 0510   CO2 26 06/03/2014 0822   GLUCOSE 103* 06/17/2014 0510   GLUCOSE 110 06/03/2014 0822   GLUCOSE 99 01/30/2013 0850   BUN 12 06/17/2014 0510   BUN 12.9 06/03/2014 0822   CREATININE 0.85 06/17/2014 0510   CREATININE 0.8 06/03/2014 0822   CALCIUM 8.8 06/17/2014 0510   CALCIUM 9.9 06/03/2014 0822   PROT 8.5* 06/03/2014 0822   PROT 7.5 04/11/2012 0837   ALBUMIN 3.3* 06/03/2014 0822   ALBUMIN 3.7 04/11/2012 0837   AST 11 06/03/2014 0822   AST 19 04/11/2012 0837   ALT 11 06/03/2014 0822   ALT 14 04/11/2012 0837   ALKPHOS 97 06/03/2014 0822   ALKPHOS 87 04/11/2012 0837   BILITOT 0.44 06/03/2014 0822   BILITOT 0.8 04/11/2012 0837   GFRNONAA 72* 06/17/2014 0510   GFRNONAA >60 04/14/2010 0952   GFRAA 83* 06/17/2014 0510   GFRAA >60 04/14/2010 0952    I No results found for this basename: SPEP, UPEP,  kappa and lambda light chains    Lab Results  Component Value Date   WBC 10.8* 06/17/2014   NEUTROABS 9.8* 06/16/2014   HGB 10.4* 06/17/2014   HCT 30.2* 06/17/2014   MCV 79.5 06/17/2014   PLT 198 06/17/2014    '@LASTCHEMISTRY' @  Lab Results  Component Value Date   LABCA2 22 08/15/2012    No components found with this basename: LABCA125     Recent Labs Lab 06/17/14 0510  INR 2.20*    Urinalysis    Component Value Date/Time   COLORURINE YELLOW 06/16/2014 1220   APPEARANCEUR CLEAR 06/16/2014 1220   LABSPEC 1.015 06/16/2014 1220   LABSPEC 1.020 03/20/2013 0834   PHURINE 6.0 06/16/2014 1220   GLUCOSEU NEGATIVE 06/16/2014 1220   GLUCOSEU Negative 03/20/2013 0834   HGBUR SMALL* 06/16/2014 1220   BILIRUBINUR NEGATIVE 06/16/2014 1220   KETONESUR NEGATIVE 06/16/2014 1220   PROTEINUR NEGATIVE 06/16/2014 1220   UROBILINOGEN 1.0 06/16/2014 1220   UROBILINOGEN 0.2 03/20/2013 0834   NITRITE NEGATIVE 06/16/2014 1220   LEUKOCYTESUR TRACE* 06/16/2014 1220    STUDIES: Dg Chest 2  View  06/16/2014   CLINICAL DATA:  Cancer.  Fever.  Abdominal pain  EXAM: CHEST  2 VIEW  COMPARISON:  06/01/2014  FINDINGS: Right chest wall port a catheter is noted with tip in the projection of the cavoatrial junction. The heart size appears mildly enlarged. There is no pleural effusion or edema. No airspace consolidation identified.  IMPRESSION: 1. Mild cardiac enlargement.   Electronically Signed   By: Kerby Moors M.D.   On: 06/16/2014 11:21   Dg Cervical Spine Complete  06/01/2014   CLINICAL DATA:  Right-sided neck pain with right upper extremity paresthesias. No injury.  EXAM: CERVICAL SPINE  4+ VIEWS  FINDINGS: Vertebral body alignment and heights are normal. There is mild spondylosis throughout the cervical spinal and. No significant disc space narrowing. Prevertebral soft tissues are within normal. Minimal left-sided neural foraminal narrowing at the C4-5, C5-6 and C6-7 levels. There is uncovertebral joint spurring and facet arthropathy. The atlantoaxial articulation is within normal. Right IJ central venous catheter is present.  IMPRESSION: Mild spondylosis throughout the cervical spine. Mild left-sided neural foraminal narrowing from the C4-5 level to the C6-7 level.   Electronically Signed   By: Marin Olp M.D.   On: 06/01/2014 09:36   Ct Chest W Contrast  06/16/2014   CLINICAL DATA:  Stage IV metastatic breast carcinoma with known history  of liver and lung metastatic disease. Acute onset of redness and pain over the right chest, recent fever  EXAM: CT CHEST WITH CONTRAST  TECHNIQUE: Multidetector CT imaging of the chest was performed during intravenous contrast administration.  CONTRAST:  71m OMNIPAQUE IOHEXOL 300 MG/ML  SOLN  COMPARISON:  06/01/2014  FINDINGS: The lungs are well aerated bilaterally. No focal infiltrate or sizable effusion is seen. No sizable parenchymal mass lesion is noted. Very mild stable scarring is noted in the right upper lobe anteriorly. This may be related to  the patient's known history of prior radiation therapy.  There are changes consistent with bilateral mastectomy. A stable postoperative fluid collection is noted on the left. Some scarring is noted in the right axilla also stable from the recent exam. In the right axilla however, there is increased density within the axillary fat which was not present on the recent exam from 15 days previous. This is likely related to the patient's known cellulitis. The superior vena cava and visualized right subclavian vein appear within normal limits. The subclavian vein on the left is patent.  The hilar and mediastinal structures show no significant lymphadenopathy. Scanning into the upper abdomen reveals changes of prior cholecystectomy. No definitive mass lesion is identified. The overall appearance is stable from the recent exam. A stable lipoma is noted in the right infraspinatus muscle. No acute bony abnormality is seen.  IMPRESSION: Postsurgical changes consistent with the patient's given clinical history.  Increase in the density within the fat of the right axilla. This is new from the recent PET-CT. This corresponds with the patient's given clinical history of cellulitis.  Chronic changes as described above stable from the prior exam.   Electronically Signed   By: MInez CatalinaM.D.   On: 06/16/2014 15:09   Nm Pet Image Restag (ps) Skull Base To Thigh  06/01/2014   CLINICAL DATA:  Subsequent treatment strategy for metastatic breast cancer.  EXAM: NUCLEAR MEDICINE PET SKULL BASE TO THIGH  TECHNIQUE: 14.3 mCi F-18 FDG was injected intravenously. Full-ring PET imaging was performed from the skull base to thigh after the radiotracer. CT data was obtained and used for attenuation correction and anatomic localization.  FASTING BLOOD GLUCOSE:  Value: 104 mg/dl  COMPARISON:  08/24/2013  FINDINGS: NECK  Just above the hyoid bone there is a left sided level 2 lymph node measuring 0.8 x 1.6 cm asymmetric increased uptake within  this lymph node has an SUV max equal to 4.8. This is new since the previous exam.  CHEST  No hypermetabolic mediastinal or hilar nodes. No suspicious pulmonary nodules on the CT scan. Status post bilateral mastectomy. No hypermetabolic axillary or supraclavicular lymph nodes.  ABDOMEN/PELVIS  No abnormal hypermetabolic activity within the liver, pancreas, adrenal glands, or spleen. No hypermetabolic lymph nodes in the abdomen or pelvis. Similar appearance of peripherally calcified 3.6 cm right adnexal lesion.  SKELETON  No focal hypermetabolic activity to suggest skeletal metastasis.  IMPRESSION: 1. No abnormal uptake highly specific for residual tumor or metastatic disease is identified. 2. Single, left level 2 lymph node exhibits mild increased uptake within SUV max equal to 4.8. This is favored to be reactive. In the absence of additional sites of abnormal uptake metastatic adenopathy is considered less favored. Attention to this area on followup imaging would be recommended.   Electronically Signed   By: TKerby MoorsM.D.   On: 06/01/2014 12:36    ASSESSMENT: 63[place]16/2014 with R arm/  chest wall cellulitis (1) with a history of inflammatory right breast cancer metastatic at presentation September 2004 with involvement of liver and bone, HER-2 positive, estrogen and progesterone receptor negative  (2) treated with carboplatin, docetaxel and Herceptin x 6 completed April 2005  (3) trastuzumab continued indefinitely; has also received lapatinib and capecitabine for variable intervals in 2007-2008. Most recent echo 03/02/2014 showed an ejection fraction of 55-60%.  (4) status post bilateral mastectomies with bilateral axillary lymph node dissection 12/07/2004, showing  (a) on the right, a mypT1c ypN1 invasive ductal carcinoma, grade 3, estrogen and progesterone receptor negative, HER-2 positive, with an MIB-1 of 31%  (b) on the left, ypT2 ypN1 invasive ductal  carcinoma, grade 2, estrogen and progesterone receptor negative, HER-2 positive, with an MIB-1 of 35%.  (5) Status post radiation June through July of 2006, to the right chest wall, left chest wall, bilateral supraclavicular fossae, and bilateral axillary boosts; with additional radiation to the right and left chest walls and the central chest wall completed November of 2007  (6) status post Ixempra x9 completed August of 2009.  (7) history of superior vena caval syndrome, on life long anticoagulation  (8) History of chemotherapy-induced neuropathy.  (9) chronic pain, with negative PET scan 08/24/2013 (no evidence of active cancer). On Neurontin and Tramadol   PLAN: Cultures are pending; the concern of course is whether we could be dealing with MRSA. Note pt's port was accessed in ED through involved skin. It may be prudent to discharge the pt on IV vancomycin to a total of 14 days. ID consulted on this patient during her prior admission for an infected port and it may be wothwhile to get their inpu.  From a breast cancer point of view she is doing remarkably well, essentially with no active disease, and tolerating anti-HER-2 immunotherapy well. She has a return appt at the cancer center OCT 29 and if discharged over the next 24-48 hours I will see her that day.  Appreciate your excellent care to this patient!   Chauncey Cruel, MD   06/17/2014 5:35 PM

## 2014-06-17 NOTE — Progress Notes (Addendum)
[facility]                                                                              Patient Demographics  [name], is a 63 y.o. female, DOB - 22-Nov-1950, [place] date - 06/16/2014   Admitting Physician No admitting provider for patient encounter.  Outpatient Primary MD for the patient is PROCHNAU,CAROLINE, MD  LOS - 1   Chief Complaint  Patient presents with  . Cancer  . Fever  . Abdominal Pain      HPI on 06/16/2014 63 year old female with history of stage IV metastatic breast cancer to liver and lung currently undergoing chemotherapy through right-sided Port-A-Cath, history of mastectomy and radiation, CHF (normal EF of 50-60% in 02/9014, followed by Dr. Missy Sabins), hypertension, history of DVT on Coumadin who presented to the ED with acute onset of redness over her right anterior chest wall overlying the Port-A-Cath site spread towards her right shoulder and upper arm. She reported having a fever of 103.25F before admission.  She denied any headache, blurred vision, dizziness, runny nose, congestion, cough, any discharge from the port. She denied any chills, nausea , vomiting, chest pain, palpitations, SOB, abdominal pain, bowel or urinary symptoms. She reported that the port was last used one week ago for chemotherapy. She denied any insect bite. She reported having mild pain over her right shoulder.  Denied any recent travel or sick contacts.   Course in the ED patient had a low-grade temperature of 99.2F. Otherwise vitals were normal. Labs done showed WBC of 12, hemoglobin of 10.5 and platelets of 203. Chemistry was unremarkable. Glucose of 116. Chest x-ray was unremarkable as well. Patient Port-A-Cath was accessed accidentally in the ED and was subsequently  deaccessed.  Patient given a dose of empiric IV vancomycin and hospitalist admission requested.   Assessment & Plan   Cellulitis of the right chest wall and right upper extremity -The patient  was started on empiric vancomycin -Per patient and husband at bedside, swelling as well as erythema have improved -Likely related to DVT as patient does have a therapeutic INR, 2.2 -Patient currently afebrile with no leukocytosis  Chronic heart failure, unspecified -Echocardiogram in July 2015 shows an EF 30-16%, normal diastolic function -Patient appears euvolemic -Will continue to monitor daily weights and intake and output. -Continue home medications of Lasix, losartan, Aldactone  Metastatic breast cancer -Patient follows with Dr. Jana Hakim  Hypertension -Continue Cozaar, Lasix, amlodipine  Anemia of chronic disease -Hemoglobin appears stable, continue to monitor CBC  History of DVT -Continue Coumadin per pharmacy  Code Status: Full  Family Communication: Husband at bedside  Disposition Plan: Admitted  Time Spent in minutes   30 minutes  Procedures  None  Consults   Oncology  DVT Prophylaxis  Coumadin  Lab Results  Component Value Date   PLT 198 06/17/2014    Medications  Scheduled Meds: . amLODipine  10 mg Oral q morning - 10a  . gabapentin  600 mg Oral BID  . losartan  100 mg Oral q morning - 10a  . potassium chloride  10 mEq Oral BID  . sodium chloride  3 mL Intravenous Q12H  . spironolactone  12.5  mg Oral Daily  . vancomycin  1,250 mg Intravenous Q12H  . warfarin  7.5 mg Oral ONCE-1800  . Warfarin - Pharmacist Dosing Inpatient   Does not apply q1800   Continuous Infusions:  PRN Meds:.sodium chloride, acetaminophen, acetaminophen, albuterol, ALPRAZolam, baclofen, furosemide, ondansetron (ZOFRAN) IV, ondansetron, sodium chloride, temazepam, traMADol  Antibiotics    Anti-infectives   Start     Dose/Rate Route Frequency Ordered Stop   06/16/14 2200  vancomycin (VANCOCIN) 1,250 mg in sodium chloride 0.9 % 250 mL IVPB     1,250 mg 166.7 mL/hr over 90 Minutes Intravenous Every 12 hours 06/16/14 1453     06/16/14 1130  vancomycin (VANCOCIN) 2,000 mg in  sodium chloride 0.9 % 500 mL IVPB     2,000 mg 250 mL/hr over 120 Minutes Intravenous  Once 06/16/14 1129 06/16/14 1408        Subjective:   [name] seen and examined today.  Patient states that her redness and swelling have improved particularly in her right upper extremity. She denies any swelling or rashes in other areas, recent complaints or illnesses. Patient currently denies any chest pain, shortness of breath, abdominal pain, nausea, vomiting. She denies feeling febrile.  Objective:   Filed Vitals:   06/16/14 2119 06/17/14 0517 06/17/14 0757 06/17/14 0950  BP: 111/66 151/94 135/76 136/78  Pulse: 1 91 78 84  Temp: 99 F (37.2 C) 98.1 F (36.7 C) 99.9 F (37.7 C) 99.9 F (37.7 C)  TempSrc: Oral Oral Oral Oral  Resp: 18 20 19    Height:      Weight:  130.455 kg (287 lb 9.6 oz)    SpO2: 98% 96% 99%     Wt Readings from Last 3 Encounters:  06/17/14 130.455 kg (287 lb 9.6 oz)  06/03/14 132.042 kg (291 lb 1.6 oz)  05/13/14 131.316 kg (289 lb 8 oz)     Intake/Output Summary (Last 24 hours) at 06/17/14 1100 Last data filed at 06/17/14 0948  Gross per 24 hour  Intake    370 ml  Output      0 ml  Net    370 ml    Exam  General: Well developed, well nourished, NAD, appears stated age  HEENT: NCAT, PERRLA, EOMI, Anicteic Sclera, mucous membranes moist.   Cardiovascular: S1 S2 auscultated, no rubs, murmurs or gallops. Regular rate and rhythm.  Respiratory: Clear to auscultation bilaterally with equal chest rise  Abdomen: Soft, nontender, nondistended, + bowel sounds  Extremities: warm dry without cyanosis clubbing or edema  Neuro: AAOx3, cranial nerves grossly intact. Strength 5/5 in patient's upper and lower extremities bilaterally  Skin: erythema on chest wall, RUE  Psych: Normal affect and demeanor with intact judgement and insight    Data Review   Micro Results Recent Results (from the past 240 hour(s))  CULTURE, BLOOD (ROUTINE X 2)     Status:  None   Collection Time    06/16/14  3:45 PM      Result Value Ref Range Status   Specimen Description BLOOD LEFT ARM   Final   Special Requests BOTTLES DRAWN AEROBIC AND ANAEROBIC 4CC   Final   Culture  Setup Time     Final   Value: 06/16/2014 20:28     Performed at [facility]   Culture     Final   Value:        BLOOD CULTURE RECEIVED NO GROWTH TO DATE CULTURE WILL BE HELD FOR 5 DAYS BEFORE ISSUING A FINAL  NEGATIVE REPORT     Performed at [facility]   Report Status PENDING   Incomplete    Radiology Reports Dg Chest 2 View  06/16/2014   CLINICAL DATA:  Cancer.  Fever.  Abdominal pain  EXAM: CHEST  2 VIEW  COMPARISON:  06/01/2014  FINDINGS: Right chest wall port a catheter is noted with tip in the projection of the cavoatrial junction. The heart size appears mildly enlarged. There is no pleural effusion or edema. No airspace consolidation identified.  IMPRESSION: 1. Mild cardiac enlargement.   Electronically Signed   By: Kerby Moors M.D.   On: 06/16/2014 11:21   Dg Cervical Spine Complete  06/01/2014   CLINICAL DATA:  Right-sided neck pain with right upper extremity paresthesias. No injury.  EXAM: CERVICAL SPINE  4+ VIEWS  FINDINGS: Vertebral body alignment and heights are normal. There is mild spondylosis throughout the cervical spinal and. No significant disc space narrowing. Prevertebral soft tissues are within normal. Minimal left-sided neural foraminal narrowing at the C4-5, C5-6 and C6-7 levels. There is uncovertebral joint spurring and facet arthropathy. The atlantoaxial articulation is within normal. Right IJ central venous catheter is present.  IMPRESSION: Mild spondylosis throughout the cervical spine. Mild left-sided neural foraminal narrowing from the C4-5 level to the C6-7 level.   Electronically Signed   By: Marin Olp M.D.   On: 06/01/2014 09:36   Ct Chest W Contrast  06/16/2014   CLINICAL DATA:  Stage IV metastatic breast carcinoma with known history  of liver and lung metastatic disease. Acute onset of redness and pain over the right chest, recent fever  EXAM: CT CHEST WITH CONTRAST  TECHNIQUE: Multidetector CT imaging of the chest was performed during intravenous contrast administration.  CONTRAST:  58mL OMNIPAQUE IOHEXOL 300 MG/ML  SOLN  COMPARISON:  06/01/2014  FINDINGS: The lungs are well aerated bilaterally. No focal infiltrate or sizable effusion is seen. No sizable parenchymal mass lesion is noted. Very mild stable scarring is noted in the right upper lobe anteriorly. This may be related to the patient's known history of prior radiation therapy.  There are changes consistent with bilateral mastectomy. A stable postoperative fluid collection is noted on the left. Some scarring is noted in the right axilla also stable from the recent exam. In the right axilla however, there is increased density within the axillary fat which was not present on the recent exam from 15 days previous. This is likely related to the patient's known cellulitis. The superior vena cava and visualized right subclavian vein appear within normal limits. The subclavian vein on the left is patent.  The hilar and mediastinal structures show no significant lymphadenopathy. Scanning into the upper abdomen reveals changes of prior cholecystectomy. No definitive mass lesion is identified. The overall appearance is stable from the recent exam. A stable lipoma is noted in the right infraspinatus muscle. No acute bony abnormality is seen.  IMPRESSION: Postsurgical changes consistent with the patient's given clinical history.  Increase in the density within the fat of the right axilla. This is new from the recent PET-CT. This corresponds with the patient's given clinical history of cellulitis.  Chronic changes as described above stable from the prior exam.   Electronically Signed   By: Inez Catalina M.D.   On: 06/16/2014 15:09   Nm Pet Image Restag (ps) Skull Base To Thigh  06/01/2014    CLINICAL DATA:  Subsequent treatment strategy for metastatic breast cancer.  EXAM: NUCLEAR MEDICINE PET SKULL BASE TO THIGH  TECHNIQUE: 14.3 mCi F-18 FDG was injected intravenously. Full-ring PET imaging was performed from the skull base to thigh after the radiotracer. CT data was obtained and used for attenuation correction and anatomic localization.  FASTING BLOOD GLUCOSE:  Value: 104 mg/dl  COMPARISON:  08/24/2013  FINDINGS: NECK  Just above the hyoid bone there is a left sided level 2 lymph node measuring 0.8 x 1.6 cm asymmetric increased uptake within this lymph node has an SUV max equal to 4.8. This is new since the previous exam.  CHEST  No hypermetabolic mediastinal or hilar nodes. No suspicious pulmonary nodules on the CT scan. Status post bilateral mastectomy. No hypermetabolic axillary or supraclavicular lymph nodes.  ABDOMEN/PELVIS  No abnormal hypermetabolic activity within the liver, pancreas, adrenal glands, or spleen. No hypermetabolic lymph nodes in the abdomen or pelvis. Similar appearance of peripherally calcified 3.6 cm right adnexal lesion.  SKELETON  No focal hypermetabolic activity to suggest skeletal metastasis.  IMPRESSION: 1. No abnormal uptake highly specific for residual tumor or metastatic disease is identified. 2. Single, left level 2 lymph node exhibits mild increased uptake within SUV max equal to 4.8. This is favored to be reactive. In the absence of additional sites of abnormal uptake metastatic adenopathy is considered less favored. Attention to this area on followup imaging would be recommended.   Electronically Signed   By: Kerby Moors M.D.   On: 06/01/2014 12:36    CBC  Recent Labs Lab 06/16/14 1148 06/17/14 0510  WBC 12.0* 10.8*  HGB 10.5* 10.4*  HCT 30.0* 30.2*  PLT 203 198  MCV 77.7* 79.5  MCH 27.2 27.4  MCHC 35.0 34.4  RDW 15.3 15.6*  LYMPHSABS 1.4  --   MONOABS 0.7  --   EOSABS 0.0  --   BASOSABS 0.0  --     Chemistries   Recent Labs Lab  06/16/14 1148 06/17/14 0510  NA 141 141  K 3.9 3.7  CL 105 108  CO2 23 23  GLUCOSE 116* 103*  BUN 14 12  CREATININE 0.97 0.85  CALCIUM 8.9 8.8   ------------------------------------------------------------------------------------------------------------------ estimated creatinine clearance is 90.6 ml/min (by C-G formula based on Cr of 0.85). ------------------------------------------------------------------------------------------------------------------ No results found for this basename: HGBA1C,  in the last 72 hours ------------------------------------------------------------------------------------------------------------------ No results found for this basename: CHOL, HDL, LDLCALC, TRIG, CHOLHDL, LDLDIRECT,  in the last 72 hours ------------------------------------------------------------------------------------------------------------------ No results found for this basename: TSH, T4TOTAL, FREET3, T3FREE, THYROIDAB,  in the last 72 hours ------------------------------------------------------------------------------------------------------------------ No results found for this basename: VITAMINB12, FOLATE, FERRITIN, TIBC, IRON, RETICCTPCT,  in the last 72 hours  Coagulation profile  Recent Labs Lab 06/16/14 1554 06/17/14 0510  INR 2.08* 2.20*    No results found for this basename: DDIMER,  in the last 72 hours  Cardiac Enzymes No results found for this basename: CK, CKMB, TROPONINI, MYOGLOBIN,  in the last 168 hours ------------------------------------------------------------------------------------------------------------------ No components found with this basename: POCBNP,     Michele Kerlin D.O. on 06/17/2014 at 11:00 AM  Between 7am to 7pm - Pager - [phone]  After 7pm go to [email or web address] - password [email or web address]  And look for the night coverage person covering for me after hours  [facility] Office  [phone]

## 2014-06-18 ENCOUNTER — Telehealth: Payer: Self-pay | Admitting: *Deleted

## 2014-06-18 ENCOUNTER — Telehealth: Payer: Self-pay | Admitting: Oncology

## 2014-06-18 DIAGNOSIS — L539 Erythematous condition, unspecified: Secondary | ICD-10-CM

## 2014-06-18 DIAGNOSIS — Z853 Personal history of malignant neoplasm of breast: Secondary | ICD-10-CM

## 2014-06-18 DIAGNOSIS — D631 Anemia in chronic kidney disease: Secondary | ICD-10-CM

## 2014-06-18 LAB — PROTIME-INR
INR: 2.14 — ABNORMAL HIGH (ref 0.00–1.49)
Prothrombin Time: 24.1 seconds — ABNORMAL HIGH (ref 11.6–15.2)

## 2014-06-18 LAB — VANCOMYCIN, TROUGH: Vancomycin Tr: 18.9 ug/mL (ref 10.0–20.0)

## 2014-06-18 MED ORDER — CEPHALEXIN 500 MG PO CAPS
500.0000 mg | ORAL_CAPSULE | Freq: Four times a day (QID) | ORAL | Status: DC
  Administered 2014-06-18 – 2014-06-19 (×5): 500 mg via ORAL
  Filled 2014-06-18 (×8): qty 1

## 2014-06-18 MED ORDER — WARFARIN SODIUM 5 MG PO TABS
5.0000 mg | ORAL_TABLET | Freq: Once | ORAL | Status: AC
  Administered 2014-06-18: 5 mg via ORAL
  Filled 2014-06-18: qty 1

## 2014-06-18 MED ORDER — DOXYCYCLINE HYCLATE 100 MG PO TABS
100.0000 mg | ORAL_TABLET | Freq: Two times a day (BID) | ORAL | Status: DC
  Administered 2014-06-18 – 2014-06-19 (×3): 100 mg via ORAL
  Filled 2014-06-18 (×4): qty 1

## 2014-06-18 NOTE — Telephone Encounter (Signed)
, °

## 2014-06-18 NOTE — Care Management Note (Signed)
    Page 1 of 1   06/18/2014     12:46:28 PM CARE MANAGEMENT NOTE 06/18/2014  Patient:  [name], [name]   Account Number:  [id number]  Date Initiated:  06/18/2014  Documentation initiated by:  Dessa Phi  Subjective/Objective Assessment:   63 Y/O F ADMITTED W/CHEST WALL CELLULITIS.HQ:PRFFMB CA,BILATERAL MASTECTOMY.     Action/Plan:   FROM HOME W/SUPPORT.   Anticipated DC Date:  06/19/2014   Anticipated DC Plan:  HOME/SELF CARE      DC Planning Services  CM consult      Choice offered to / List presented to:  C-1 Patient           Status of service:  In process, will continue to follow Medicare Important Message given?   (If response is "NO", the following Medicare IM given date fields will be blank) Date Medicare IM given:   Medicare IM given by:   Date Additional Medicare IM given:   Additional Medicare IM given by:    Discharge Disposition:    Per UR Regulation:  Reviewed for med. necessity/level of care/duration of stay  If discussed at [place] of Stay Meetings, dates discussed:    Comments:  06/18/14 Mardell Cragg RN,BSN NCM [place].PROVIDED [place] AGENCY LIST-[facility] CHOSEN.TC K[facility] REP AWARE & FOLLOWING.PATIENT HAS USED [facility] IN PAST FOR HOME IV ABX.AWAIT FINAL ORDERS.

## 2014-06-18 NOTE — Progress Notes (Signed)
ANTIBIOTIC CONSULT NOTE - Follow Up  Pharmacy Consult for Vancomycin Indication: rule out Cellulitis  Allergies  Allergen Reactions  . Adhesive [Tape] Other (See Comments)    Tears skin   . Penicillins Hives   Patient Measurements: Height: 5\' 3"  (160 cm) Weight: 287 lb 9.6 oz (130.455 kg) IBW/kg (Calculated) : 52.4 Total body weight: 132 kg  Vital Signs: Temp: 98.6 F (37 C) (10/23 0606) Temp Source: Oral (10/23 0606) BP: 119/63 mmHg (10/23 0606) Pulse Rate: 71 (10/23 0606) Intake/Output from previous day: 10/22 0701 - 10/23 0700 In: 420 [P.O.:420] Out: -  Intake/Output from this shift:    Labs:  Recent Labs  06/16/14 1148 06/17/14 0510  WBC 12.0* 10.8*  HGB 10.5* 10.4*  PLT 203 198  CREATININE 0.97 0.85   Estimated Creatinine Clearance: 90.6 ml/min (by C-G formula based on Cr of 0.85).  Recent Labs  06/18/14 0926  [place] 18.9     Microbiology: Recent Results (from the past 720 hour(s))  CULTURE, BLOOD (ROUTINE X 2)     Status: None   Collection Time    06/16/14  3:45 PM      Result Value Ref Range Status   Specimen Description BLOOD LEFT ARM   Final   Special Requests BOTTLES DRAWN AEROBIC AND ANAEROBIC 4CC   Final   Culture  Setup Time     Final   Value: 06/16/2014 20:28     Performed at [facility]   Culture     Final   Value:        BLOOD CULTURE RECEIVED NO GROWTH TO DATE CULTURE WILL BE HELD FOR 5 DAYS BEFORE ISSUING A FINAL NEGATIVE REPORT     Performed at [facility]   Report Status PENDING   Incomplete  CULTURE, BLOOD (ROUTINE X 2)     Status: None   Collection Time    06/16/14  9:45 PM      Result Value Ref Range Status   Specimen Description BLOOD LEFT ANTECUBITAL   Final   Special Requests     Final   Value: BOTTLES DRAWN AEROBIC AND ANAEROBIC 5CC IMMUNOCOMPROMISED   Culture  Setup Time     Final   Value: 06/17/2014 03:44     Performed at [facility]   Culture     Final   Value:        BLOOD  CULTURE RECEIVED NO GROWTH TO DATE CULTURE WILL BE HELD FOR 5 DAYS BEFORE ISSUING A FINAL NEGATIVE REPORT     Performed at [facility]   Report Status PENDING   Incomplete  CLOSTRIDIUM DIFFICILE BY PCR     Status: None   Collection Time    06/17/14 10:49 AM      Result Value Ref Range Status   C difficile by pcr NEGATIVE  NEGATIVE Final   Comment: Performed at [place] History: Past Medical History  Diagnosis Date  . Breast cancer     mets to liver and lung  . Hypertension   . SVC syndrome   . History of chemotherapy Feb. 2006    taxotere/herceptin/carboplatin  . Radiation 07/31/2006    left upper chest  . Radiation 06/17/2006-06/27/2006    6480 cGy bilat. chest wall  . Neuropathy   . Thrombosis   . Breast cancer metastasized to multiple sites 02/26/2013   Medications:  Scheduled:  Anti-infectives   Start     Dose/Rate Route Frequency Ordered Stop  06/16/14 2200  vancomycin (VANCOCIN) 1,250 mg in sodium chloride 0.9 % 250 mL IVPB     1,250 mg 166.7 mL/hr over 90 Minutes Intravenous Every 12 hours 06/16/14 1453     06/16/14 1130  vancomycin (VANCOCIN) 2,000 mg in sodium chloride 0.9 % 500 mL IVPB     2,000 mg 250 mL/hr over 120 Minutes Intravenous  Once 06/16/14 1129 06/16/14 1408     Assessment: 37 yoF with hx of Stage IV Breast Ca, mets to liver, lung. Hx of DVT on chronic Warfarin. Presented to ED with acute onset of redness over R chest wall, shoulder and upper arm. Port-A-Cath on right, not accessed. Chemotherapy Regimen: Herceptin q 21 days, last cycle 31 received 10/8. Chest CT consistent with cellulitis, per physician note swelling and erythema have improved  10/21 >> Vanc >>  Tmax: 100.8 WBCs: 10.8 Renal:SCr wnl  10/21 blood: NGTD  Levels/dose changes: 10/23 0930 VT = 18.9 mcg/ml on 1250mg  IV q12h prior to 5th overall dose  Goal of Therapy:  Vancomycin trough level 15-20 mcg/ml  Plan:   Will aim for higher trough with  possible PAC involvement (trough of 10-15 mcg/ml usual for cellulitis) - PAC involvement unlikely   Continue Vancomycin 1250mg  q12 hrs (anticipate vancomycin to stop in next 24h per d/w physician)  Enid Skeens, PharmD Candidate  06/18/2014  Agree with above plan  Doreene Eland, PharmD, BCPS.   Pager: [phone] 06/18/2014 11:39 AM

## 2014-06-18 NOTE — Progress Notes (Signed)
ANTICOAGULATION CONSULT NOTE - follow up  Pharmacy Consult for Warfarin Indication: hx of DVT  Allergies  Allergen Reactions  . Adhesive [Tape] Other (See Comments)    Tears skin   . Penicillins Hives   Patient Measurements: Height: 5\' 3"  (160 cm) Weight: 287 lb 9.6 oz (130.455 kg) IBW/kg (Calculated) : 52.4 Total body weight: 132 kg  Vital Signs: Temp: 98.6 F (37 C) (10/23 0606) Temp Source: Oral (10/23 0606) BP: 119/63 mmHg (10/23 0606) Pulse Rate: 71 (10/23 0606)  Labs:  Recent Labs  06/16/14 1148 06/16/14 1554 06/17/14 0510 06/18/14 0420  HGB 10.5*  --  10.4*  --   HCT 30.0*  --  30.2*  --   PLT 203  --  198  --   LABPROT  --  23.6* 24.6* 24.1*  INR  --  2.08* 2.20* 2.14*  CREATININE 0.97  --  0.85  --    Estimated Creatinine Clearance: 90.6 ml/min (by C-G formula based on Cr of 0.85).  Medications:  Scheduled:  . amLODipine  10 mg Oral q morning - 10a  . gabapentin  600 mg Oral BID  . losartan  100 mg Oral q morning - 10a  . potassium chloride  10 mEq Oral BID  . sodium chloride  3 mL Intravenous Q12H  . spironolactone  12.5 mg Oral Daily  . vancomycin  1,250 mg Intravenous Q12H  . Warfarin - Pharmacist Dosing Inpatient   Does not apply q1800   Anti-infectives   Start     Dose/Rate Route Frequency Ordered Stop   06/16/14 2200  vancomycin (VANCOCIN) 1,250 mg in sodium chloride 0.9 % 250 mL IVPB     1,250 mg 166.7 mL/hr over 90 Minutes Intravenous Every 12 hours 06/16/14 1453     06/16/14 1130  vancomycin (VANCOCIN) 2,000 mg in sodium chloride 0.9 % 500 mL IVPB     2,000 mg 250 mL/hr over 120 Minutes Intravenous  Once 06/16/14 1129 06/16/14 1408     Assessment (10/23): 40 yoF with hx of Stage IV Breast Ca, mets to liver, lung. Hx of DVT on chronic Warfarin. Presented to ED with acute onset of redness over R chest wall, shoulder and upper arm. Port-A-Cath on right, not accessed. Chemotherapy Regimen: Herceptin q 21 days, last cycle 31 received 10/8.  Last anti-coag clinic visit 10/8 with INR of 3.0   Home dose 7.5mg  daily except 5mg  on Fridays  Today, 10/23:  INR 2.14 within goal range  Hgb/ Hct: low stable; plts stable (10/19)  Cardiac diet ordered  Goal of Therapy:  INR 2-3   Plan:   Continue with home dose regimen warfarin 5mg  x 1 at 1800 tonight  Daily PT/INR ordered  Check CBC daily  Enid Skeens, PharmD Candidate  06/18/2014   Agree with above plan  Doreene Eland, PharmD, BCPS.   Pager: [phone] 06/18/2014 11:33 AM

## 2014-06-18 NOTE — Clinical Documentation Improvement (Signed)
PLEASE SPECIFY TYPE & ACUITY OF CHF: Possible Clinical Conditions?  Chronic Systolic Congestive Heart Failure Chronic Diastolic Congestive Heart Failure Chronic Systolic & Diastolic Congestive Heart Failure Acute Systolic Congestive Heart Failure Acute Diastolic Congestive Heart Failure Acute Systolic & Diastolic Congestive Heart Failure Acute on Chronic Systolic Congestive Heart Failure Acute on Chronic Diastolic Congestive Heart Failure Acute on Chronic Systolic & Diastolic Congestive Heart Failure Other Condition Cannot Clinically Determine  Supporting Information:(As per notes) "CHF (normal EF of 50-60% in 02/9014, followed by Dr. Missy Sabins)" Chronic heart failure, unspecified  -Echocardiogram in July 2015 shows an EF 82-95%, normal diastolic function  -Patient appears euvolemic  -Will continue to monitor daily weights and intake and output.  -Continue home medications of Lasix, losartan, Aldactone    Thank You, Alessandra Grout, RN, BSN, CCDS,Clinical Documentation Specialist:  [phone]  [phone]=[facility] [employer]- Health Information Management

## 2014-06-18 NOTE — Telephone Encounter (Signed)
I have adjusted 10/29 appt

## 2014-06-18 NOTE — Progress Notes (Signed)
[facility]                                                                              Patient Demographics  [name], is a 63 y.o. female, DOB - [birth year], [place] date - 06/16/2014   Admitting Physician No admitting provider for patient encounter.  Outpatient Primary MD for the patient is PROCHNAU,CAROLINE, MD  LOS - 2   Chief Complaint  Patient presents with  . Cancer  . Fever  . Abdominal Pain      HPI on 06/16/2014 63 year old female with history of stage IV metastatic breast cancer to liver and lung currently undergoing chemotherapy through right-sided Port-A-Cath, history of mastectomy and radiation, CHF (normal EF of 50-60% in 02/9014, followed by Dr. Missy Sabins), hypertension, history of DVT on Coumadin who presented to the ED with acute onset of redness over her right anterior chest wall overlying the Port-A-Cath site spread towards her right shoulder and upper arm. She reported having a fever of 103.66F before admission.  She denied any headache, blurred vision, dizziness, runny nose, congestion, cough, any discharge from the port. She denied any chills, nausea , vomiting, chest pain, palpitations, SOB, abdominal pain, bowel or urinary symptoms. She reported that the port was last used one week ago for chemotherapy. She denied any insect bite. She reported having mild pain over her right shoulder.  Denied any recent travel or sick contacts.   Course in the ED patient had a low-grade temperature of 99.24F. Otherwise vitals were normal. Labs done showed WBC of 12, hemoglobin of 10.5 and platelets of 203. Chemistry was unremarkable. Glucose of 116. Chest x-ray was unremarkable as well. Patient Port-A-Cath was accessed accidentally in the ED and was subsequently  deaccessed.  Patient given a dose of empiric IV vancomycin and hospitalist admission requested.   Assessment & Plan   Cellulitis of the right chest wall and right upper extremity -?port, which  as been deaccessed -The patient was started on empiric vancomycin -Blood cultures remain negative -Erythema and edema have improved -Likely related to DVT as patient does have a therapeutic INR, 2.14 -Patient currently afebrile with no leukocytosis  -Infectious disease consulted and appreciated  Chronic heart failure, unspecified (unable to determine clinically) -Echocardiogram in July 2015 shows an EF 78-46%, normal diastolic function -Patient appears euvolemic -Will continue to monitor daily weights and intake and output. -Continue home medications of Lasix, losartan, Aldactone  Metastatic breast cancer -Patient follows with Dr. Jana Hakim  Hypertension -Continue Cozaar, Lasix, amlodipine  Anemia of chronic disease -Hemoglobin appears stable, continue to monitor CBC  History of DVT -Continue Coumadin per pharmacy  Code Status: Full  Family Communication: Husband at bedside  Disposition Plan: Admitted  Time Spent in minutes   25 minutes  Procedures  None  Consults   Oncology Infectious disease  DVT Prophylaxis  Coumadin  Lab Results  Component Value Date   PLT 198 06/17/2014    Medications  Scheduled Meds: . amLODipine  10 mg Oral q morning - 10a  . gabapentin  600 mg Oral BID  . losartan  100 mg Oral q morning - 10a  . potassium chloride  10 mEq Oral BID  .  sodium chloride  3 mL Intravenous Q12H  . spironolactone  12.5 mg Oral Daily  . vancomycin  1,250 mg Intravenous Q12H  . Warfarin - Pharmacist Dosing Inpatient   Does not apply q1800   Continuous Infusions:  PRN Meds:.sodium chloride, acetaminophen, acetaminophen, albuterol, ALPRAZolam, baclofen, furosemide, ondansetron (ZOFRAN) IV, ondansetron, sodium chloride, temazepam, traMADol  Antibiotics    Anti-infectives   Start     Dose/Rate Route Frequency Ordered Stop   06/16/14 2200  vancomycin (VANCOCIN) 1,250 mg in sodium chloride 0.9 % 250 mL IVPB     1,250 mg 166.7 mL/hr over 90 Minutes  Intravenous Every 12 hours 06/16/14 1453     06/16/14 1130  vancomycin (VANCOCIN) 2,000 mg in sodium chloride 0.9 % 500 mL IVPB     2,000 mg 250 mL/hr over 120 Minutes Intravenous  Once 06/16/14 1129 06/16/14 1408        Subjective:   [name] seen and examined today.  Patient states she feel much better and inquires about going home.  She denies fever or skin irritation at this time.  She feels the redness and swelling has improved.  Denies chest pain, SOB, abdominal pain.  Objective:   Filed Vitals:   06/17/14 0950 06/17/14 1300 06/17/14 2145 06/18/14 0606  BP: 136/78 142/73 143/82 119/63  Pulse: 84 81 98 71  Temp: 99.9 F (37.7 C) 98.4 F (36.9 C) 100.8 F (38.2 C) 98.6 F (37 C)  TempSrc: Oral Tympanic Oral Oral  Resp:  19 20 18   Height:      Weight:      SpO2:  100% 100% 97%    Wt Readings from Last 3 Encounters:  06/17/14 130.455 kg (287 lb 9.6 oz)  06/03/14 132.042 kg (291 lb 1.6 oz)  05/13/14 131.316 kg (289 lb 8 oz)     Intake/Output Summary (Last 24 hours) at 06/18/14 1045 Last data filed at 06/17/14 1845  Gross per 24 hour  Intake    300 ml  Output      0 ml  Net    300 ml    Exam  General: Well developed, well nourished, NAD, appears stated age  HEENT: NCAT, mucous membranes moist.   Cardiovascular: S1 S2 auscultated, no rubs, murmurs or gallops. Regular rate and rhythm.  Respiratory: Clear to auscultation bilaterally with equal chest rise  Abdomen: Soft, obese, nontender, nondistended, + bowel sounds  Extremities: warm dry without cyanosis clubbing or edema  Neuro: AAOx3, no focal deficits  Skin: Erythema on chest wall improving  Psych: Appropriate mood and affect  Data Review   Micro Results Recent Results (from the past 240 hour(s))  CULTURE, BLOOD (ROUTINE X 2)     Status: None   Collection Time    06/16/14  3:45 PM      Result Value Ref Range Status   Specimen Description BLOOD LEFT ARM   Final   Special Requests BOTTLES  DRAWN AEROBIC AND ANAEROBIC 4CC   Final   Culture  Setup Time     Final   Value: 06/16/2014 20:28     Performed at [facility]   Culture     Final   Value:        BLOOD CULTURE RECEIVED NO GROWTH TO DATE CULTURE WILL BE HELD FOR 5 DAYS BEFORE ISSUING A FINAL NEGATIVE REPORT     Performed at [facility]   Report Status PENDING   Incomplete  CULTURE, BLOOD (ROUTINE X 2)  Status: None   Collection Time    06/16/14  9:45 PM      Result Value Ref Range Status   Specimen Description BLOOD LEFT ANTECUBITAL   Final   Special Requests     Final   Value: BOTTLES DRAWN AEROBIC AND ANAEROBIC 5CC IMMUNOCOMPROMISED   Culture  Setup Time     Final   Value: 06/17/2014 03:44     Performed at [facility]   Culture     Final   Value:        BLOOD CULTURE RECEIVED NO GROWTH TO DATE CULTURE WILL BE HELD FOR 5 DAYS BEFORE ISSUING A FINAL NEGATIVE REPORT     Performed at [facility]   Report Status PENDING   Incomplete  CLOSTRIDIUM DIFFICILE BY PCR     Status: None   Collection Time    06/17/14 10:49 AM      Result Value Ref Range Status   C difficile by pcr NEGATIVE  NEGATIVE Final   Comment: Performed at [facility]    Radiology Reports Dg Chest 2 View  06/16/2014   CLINICAL DATA:  Cancer.  Fever.  Abdominal pain  EXAM: CHEST  2 VIEW  COMPARISON:  06/01/2014  FINDINGS: Right chest wall port a catheter is noted with tip in the projection of the cavoatrial junction. The heart size appears mildly enlarged. There is no pleural effusion or edema. No airspace consolidation identified.  IMPRESSION: 1. Mild cardiac enlargement.   Electronically Signed   By: Kerby Moors M.D.   On: 06/16/2014 11:21   Dg Cervical Spine Complete  06/01/2014   CLINICAL DATA:  Right-sided neck pain with right upper extremity paresthesias. No injury.  EXAM: CERVICAL SPINE  4+ VIEWS  FINDINGS: Vertebral body alignment and heights are normal. There is mild spondylosis throughout the  cervical spinal and. No significant disc space narrowing. Prevertebral soft tissues are within normal. Minimal left-sided neural foraminal narrowing at the C4-5, C5-6 and C6-7 levels. There is uncovertebral joint spurring and facet arthropathy. The atlantoaxial articulation is within normal. Right IJ central venous catheter is present.  IMPRESSION: Mild spondylosis throughout the cervical spine. Mild left-sided neural foraminal narrowing from the C4-5 level to the C6-7 level.   Electronically Signed   By: Marin Olp M.D.   On: 06/01/2014 09:36   Ct Chest W Contrast  06/16/2014   CLINICAL DATA:  Stage IV metastatic breast carcinoma with known history of liver and lung metastatic disease. Acute onset of redness and pain over the right chest, recent fever  EXAM: CT CHEST WITH CONTRAST  TECHNIQUE: Multidetector CT imaging of the chest was performed during intravenous contrast administration.  CONTRAST:  59mL OMNIPAQUE IOHEXOL 300 MG/ML  SOLN  COMPARISON:  06/01/2014  FINDINGS: The lungs are well aerated bilaterally. No focal infiltrate or sizable effusion is seen. No sizable parenchymal mass lesion is noted. Very mild stable scarring is noted in the right upper lobe anteriorly. This may be related to the patient's known history of prior radiation therapy.  There are changes consistent with bilateral mastectomy. A stable postoperative fluid collection is noted on the left. Some scarring is noted in the right axilla also stable from the recent exam. In the right axilla however, there is increased density within the axillary fat which was not present on the recent exam from 15 days previous. This is likely related to the patient's known cellulitis. The superior vena cava and visualized right subclavian vein appear within normal  limits. The subclavian vein on the left is patent.  The hilar and mediastinal structures show no significant lymphadenopathy. Scanning into the upper abdomen reveals changes of prior  cholecystectomy. No definitive mass lesion is identified. The overall appearance is stable from the recent exam. A stable lipoma is noted in the right infraspinatus muscle. No acute bony abnormality is seen.  IMPRESSION: Postsurgical changes consistent with the patient's given clinical history.  Increase in the density within the fat of the right axilla. This is new from the recent PET-CT. This corresponds with the patient's given clinical history of cellulitis.  Chronic changes as described above stable from the prior exam.   Electronically Signed   By: Inez Catalina M.D.   On: 06/16/2014 15:09   Nm Pet Image Restag (ps) Skull Base To Thigh  06/01/2014   CLINICAL DATA:  Subsequent treatment strategy for metastatic breast cancer.  EXAM: NUCLEAR MEDICINE PET SKULL BASE TO THIGH  TECHNIQUE: 14.3 mCi F-18 FDG was injected intravenously. Full-ring PET imaging was performed from the skull base to thigh after the radiotracer. CT data was obtained and used for attenuation correction and anatomic localization.  FASTING BLOOD GLUCOSE:  Value: 104 mg/dl  COMPARISON:  08/24/2013  FINDINGS: NECK  Just above the hyoid bone there is a left sided level 2 lymph node measuring 0.8 x 1.6 cm asymmetric increased uptake within this lymph node has an SUV max equal to 4.8. This is new since the previous exam.  CHEST  No hypermetabolic mediastinal or hilar nodes. No suspicious pulmonary nodules on the CT scan. Status post bilateral mastectomy. No hypermetabolic axillary or supraclavicular lymph nodes.  ABDOMEN/PELVIS  No abnormal hypermetabolic activity within the liver, pancreas, adrenal glands, or spleen. No hypermetabolic lymph nodes in the abdomen or pelvis. Similar appearance of peripherally calcified 3.6 cm right adnexal lesion.  SKELETON  No focal hypermetabolic activity to suggest skeletal metastasis.  IMPRESSION: 1. No abnormal uptake highly specific for residual tumor or metastatic disease is identified. 2. Single, left level  2 lymph node exhibits mild increased uptake within SUV max equal to 4.8. This is favored to be reactive. In the absence of additional sites of abnormal uptake metastatic adenopathy is considered less favored. Attention to this area on followup imaging would be recommended.   Electronically Signed   By: Kerby Moors M.D.   On: 06/01/2014 12:36    CBC  Recent Labs Lab 06/16/14 1148 06/17/14 0510  WBC 12.0* 10.8*  HGB 10.5* 10.4*  HCT 30.0* 30.2*  PLT 203 198  MCV 77.7* 79.5  MCH 27.2 27.4  MCHC 35.0 34.4  RDW 15.3 15.6*  LYMPHSABS 1.4  --   MONOABS 0.7  --   EOSABS 0.0  --   BASOSABS 0.0  --     Chemistries   Recent Labs Lab 06/16/14 1148 06/17/14 0510  NA 141 141  K 3.9 3.7  CL 105 108  CO2 23 23  GLUCOSE 116* 103*  BUN 14 12  CREATININE 0.97 0.85  CALCIUM 8.9 8.8   ------------------------------------------------------------------------------------------------------------------ estimated creatinine clearance is 90.6 ml/min (by C-G formula based on Cr of 0.85). ------------------------------------------------------------------------------------------------------------------ No results found for this basename: HGBA1C,  in the last 72 hours ------------------------------------------------------------------------------------------------------------------ No results found for this basename: CHOL, HDL, LDLCALC, TRIG, CHOLHDL, LDLDIRECT,  in the last 72 hours ------------------------------------------------------------------------------------------------------------------ No results found for this basename: TSH, T4TOTAL, FREET3, T3FREE, THYROIDAB,  in the last 72 hours ------------------------------------------------------------------------------------------------------------------ No results found for this basename: VITAMINB12, FOLATE, FERRITIN, TIBC, IRON,  RETICCTPCT,  in the last 72 hours  Coagulation profile  Recent Labs Lab 06/16/14 1554 06/17/14 0510  06/18/14 0420  INR 2.08* 2.20* 2.14*    No results found for this basename: DDIMER,  in the last 72 hours  Cardiac Enzymes No results found for this basename: CK, CKMB, TROPONINI, MYOGLOBIN,  in the last 168 hours ------------------------------------------------------------------------------------------------------------------ No components found with this basename: POCBNP,     [name] D.O. on 06/18/2014 at 10:45 AM  Between 7am to 7pm - Pager - [phone]  After 7pm go to [email or web address] - password [email or web address]  And look for the night coverage person covering for me after hours  [facility] Office  [phone]

## 2014-06-18 NOTE — Consult Note (Signed)
[facility]    Date of Admission:  06/16/2014  Date of Consult:  06/18/2014  Reason for Consult: Cellulitis Referring Physician: Dr. Ree Kida   HPI: [name] is an 63 y.o. female with complicated hx of metastatic breast cancer dx in 2004, SVC syndrome, who my partner Michel Bickers saw last year when she was bacteremic with Serratia and with infected PICC line that was removed (though she has severe PCN allergy she did tolerate rx with Cephalosporins at that time without incident. She presented to ED with fever to 103.5 and upon arrival was found to have erythema in her right arm and over her right anterior chest wall. Including area of her porta-cath. She had blood cultures drawn which were negative. She had a CT of her chest which was only showing induration in her arm.   She has had improvement in erythema in arm and chest since then. She does not want to have to have her portacath removed again --if possible. This is her 3rd one.   Past Medical History  Diagnosis Date  . Breast cancer     mets to liver and lung  . Hypertension   . SVC syndrome   . History of chemotherapy Feb. 2006    taxotere/herceptin/carboplatin  . Radiation 07/31/2006    left upper chest  . Radiation 06/17/2006-06/27/2006    6480 cGy bilat. chest wall  . Neuropathy   . Thrombosis   . Breast cancer metastasized to multiple sites 02/26/2013    Past Surgical History  Procedure Laterality Date  . Tubal ligation  1986  . Cholecystectomy  1989  . Mastectomy Bilateral   . Ankle surgery    . Peripherally inserted central catheter insertion    . Back surgery    ergies:   Allergies  Allergen Reactions  . Penicillins Hives    PATIENT HAS TOLERATED CEPHALOSPORINGS  . Adhesive [Tape] Other (See Comments)    Tears skin      Medications: I have reviewed patients current medications as documented in Epic Anti-infectives   Start     Dose/Rate Route Frequency Ordered Stop     06/18/14 1345  cephALEXin (KEFLEX) capsule 500 mg     500 mg Oral 4 times per day 06/18/14 1332     06/18/14 1345  doxycycline (VIBRA-TABS) tablet 100 mg     100 mg Oral Every 12 hours 06/18/14 1333     06/16/14 2200  vancomycin (VANCOCIN) 1,250 mg in sodium chloride 0.9 % 250 mL IVPB  Status:  Discontinued     1,250 mg 166.7 mL/hr over 90 Minutes Intravenous Every 12 hours 06/16/14 1453 06/18/14 1332   06/16/14 1130  vancomycin (VANCOCIN) 2,000 mg in sodium chloride 0.9 % 500 mL IVPB     2,000 mg 250 mL/hr over 120 Minutes Intravenous  Once 06/16/14 1129 06/16/14 1408      Social History:  reports that she has never smoked. She has never used smokeless tobacco. She reports that she drinks alcohol. She reports that she does not use illicit drugs.  Family History  Problem Relation Age of Onset  . Heart failure Father   . Cancer Father     Prostate cancer  . Heart failure Brother   . Cancer Brother     Prostate cancer  . Diabetes Maternal Aunt     As in HPI and primary teams notes otherwise 12 point review of systems is negative  Blood pressure 122/98, pulse 68, temperature  98.1 F (36.7 C), temperature source Oral, resp. rate 18, height '5\' 3"'  (1.6 m), weight 287 lb 9.6 oz (130.455 kg), SpO2 96.00%. General: Alert and awake, oriented x3, not in any acute distress. HEENT: anicteric sclera,  EOMI, oropharynx clear  CVS regular rate, normal r,  no murmur rubs or gallops Chest: clear to auscultation bilaterally, no wheezing, rales or rhonchi Abdomen: soft nontender, nondistended, normal bowel sounds, Extremities:skin: Sp bilateral mastectomies and lymphedema. Porta-cath at present is not tender or warm more than other sites. She does have erythema on her arm as well as across chest where her mastectomy scar is.        Portacath site:       She also has newly identified posterior area of erythema that family member pointed out      Neuro: nonfocal, strength and  sensation intact   Results for orders placed during the hospital encounter of 06/16/14 (from the past 48 hour(s))  CULTURE, BLOOD (ROUTINE X 2)     Status: None   Collection Time    06/16/14  3:45 PM      Result Value Ref Range   Specimen Description BLOOD LEFT ARM     Special Requests BOTTLES DRAWN AEROBIC AND ANAEROBIC 4CC     Culture  Setup Time       Value: 06/16/2014 20:28     Performed at [facility]   Culture       Value:        BLOOD CULTURE RECEIVED NO GROWTH TO DATE CULTURE WILL BE HELD FOR 5 DAYS BEFORE ISSUING A FINAL NEGATIVE REPORT     Performed at [facility]   Report Status PENDING    PROTIME-INR     Status: Abnormal   Collection Time    06/16/14  3:54 PM      Result Value Ref Range   Prothrombin Time 23.6 (*) 11.6 - 15.2 seconds   INR 2.08 (*) 0.00 - 1.49  CULTURE, BLOOD (ROUTINE X 2)     Status: None   Collection Time    06/16/14  9:45 PM      Result Value Ref Range   Specimen Description BLOOD LEFT ANTECUBITAL     Special Requests       Value: BOTTLES DRAWN AEROBIC AND ANAEROBIC 5CC IMMUNOCOMPROMISED   Culture  Setup Time       Value: 06/17/2014 03:44     Performed at [facility]   Culture       Value:        BLOOD CULTURE RECEIVED NO GROWTH TO DATE CULTURE WILL BE HELD FOR 5 DAYS BEFORE ISSUING A FINAL NEGATIVE REPORT     Performed at [facility]   Report Status PENDING    CBC     Status: Abnormal   Collection Time    06/17/14  5:10 AM      Result Value Ref Range   WBC 10.8 (*) 4.0 - 10.5 K/uL   RBC 3.80 (*) 3.87 - 5.11 MIL/uL   Hemoglobin 10.4 (*) 12.0 - 15.0 g/dL   HCT 30.2 (*) 36.0 - 46.0 %   MCV 79.5  78.0 - 100.0 fL   MCH 27.4  26.0 - 34.0 pg   MCHC 34.4  30.0 - 36.0 g/dL   RDW 15.6 (*) 11.5 - 15.5 %   Platelets 198  150 - 400 K/uL  BASIC METABOLIC PANEL     Status: Abnormal   Collection Time  06/17/14  5:10 AM      Result Value Ref Range   Sodium 141  137 - 147 mEq/L   Potassium 3.7  3.7 - 5.3 mEq/L    Chloride 108  96 - 112 mEq/L   CO2 23  19 - 32 mEq/L   Glucose, Bld 103 (*) 70 - 99 mg/dL   BUN 12  6 - 23 mg/dL   Creatinine, Ser 0.85  0.50 - 1.10 mg/dL   Calcium 8.8  8.4 - 10.5 mg/dL   GFR calc non Af Amer 72 (*) >90 mL/min   GFR calc Af Amer 83 (*) >90 mL/min   Comment: (NOTE)     The eGFR has been calculated using the CKD EPI equation.     This calculation has not been validated in all clinical situations.     eGFR's persistently <90 mL/min signify possible Chronic Kidney     Disease.   Anion gap 10  5 - 15  PROTIME-INR     Status: Abnormal   Collection Time    06/17/14  5:10 AM      Result Value Ref Range   Prothrombin Time 24.6 (*) 11.6 - 15.2 seconds   INR 2.20 (*) 0.00 - 1.49  CLOSTRIDIUM DIFFICILE BY PCR     Status: None   Collection Time    06/17/14 10:49 AM      Result Value Ref Range   C difficile by pcr NEGATIVE  NEGATIVE   Comment: Performed at [facility]     Status: Abnormal   Collection Time    06/18/14  4:20 AM      Result Value Ref Range   Prothrombin Time 24.1 (*) 11.6 - 15.2 seconds   INR 2.14 (*) 0.00 - 1.49  VANCOMYCIN, TROUGH     Status: None   Collection Time    06/18/14  9:26 AM      Result Value Ref Range   Vancomycin Tr 18.9  10.0 - 20.0 ug/mL   '@BRIEFLABTABLE' (sdes,specrequest,cult,reptstatus)   ) Recent Results (from the past 720 hour(s))  CULTURE, BLOOD (ROUTINE X 2)     Status: None   Collection Time    06/16/14  3:45 PM      Result Value Ref Range Status   Specimen Description BLOOD LEFT ARM   Final   Special Requests BOTTLES DRAWN AEROBIC AND ANAEROBIC 4CC   Final   Culture  Setup Time     Final   Value: 06/16/2014 20:28     Performed at [facility]   Culture     Final   Value:        BLOOD CULTURE RECEIVED NO GROWTH TO DATE CULTURE WILL BE HELD FOR 5 DAYS BEFORE ISSUING A FINAL NEGATIVE REPORT     Performed at [facility]   Report Status PENDING   Incomplete  CULTURE, BLOOD (ROUTINE X  2)     Status: None   Collection Time    06/16/14  9:45 PM      Result Value Ref Range Status   Specimen Description BLOOD LEFT ANTECUBITAL   Final   Special Requests     Final   Value: BOTTLES DRAWN AEROBIC AND ANAEROBIC 5CC IMMUNOCOMPROMISED   Culture  Setup Time     Final   Value: 06/17/2014 03:44     Performed at [facility]   Culture     Final   Value:  BLOOD CULTURE RECEIVED NO GROWTH TO DATE CULTURE WILL BE HELD FOR 5 DAYS BEFORE ISSUING A FINAL NEGATIVE REPORT     Performed at [facility]   Report Status PENDING   Incomplete  CLOSTRIDIUM DIFFICILE BY PCR     Status: None   Collection Time    06/17/14 10:49 AM      Result Value Ref Range Status   C difficile by pcr NEGATIVE  NEGATIVE Final   Comment: Performed at [facility]     Impression/Recommendation  Principal Problem:   Cellulitis of chest wall Active Problems:   CHF (congestive heart failure)   Breast cancer metastasized to multiple sites   Anemia of chronic disease   Faithe D Holsapple is a 63 y.o. female with  Hx of metastatic breast cancer, SVC syndrome, DVTS, on coumadin,  Hx of several portacaths having to have been removed due to infection, recent (in 02/2013) Serratia bacteremia associated with PICC line now with erythema involving chest and arm. Apparently her port was inadvertently accessed in ED.  #1 Erythema of arm, chest: Differential includes cellulitis in part due to proclivity with her lymphedema, vs deeper infection such as porta-cath infection.  She is not currently bacteremic and on my exam today skin overlying portacath not tender or esp erythematous.   We will giver her benefit of doubt that port is not infected and proceed with oral antibiotics to cover for cellulitis --dc Vancomycin --start keflex 539m po qid (to cover most likely suspects such as strep species (and will cover MSSA --start doxycyline 100 mg bid as well to cover for MRSA though not obviously  involved unless this is a purulent infection involving her port  Plan on treating her for 10 more days with this regimen  --if she fails to improve on this regimen or worsens, then I would move to consult [facility]CS for removal of the portacath and exploration of tissue surrouding it  #2 Screening: check HIV   Dr. HJohnnye Simawill be on over the weekend and will check in on her tomorrow.   06/18/2014, 2:38 PM   Thank you so much for this interesting consult  RDonnellsonfor [phone]) [phone](office) 06/18/2014, 2:38 PM  CRhina BrackettDam 06/18/2014, 2:38 PM

## 2014-06-19 DIAGNOSIS — L03113 Cellulitis of right upper limb: Principal | ICD-10-CM

## 2014-06-19 LAB — BASIC METABOLIC PANEL
ANION GAP: 12 (ref 5–15)
BUN: 11 mg/dL (ref 6–23)
CO2: 24 meq/L (ref 19–32)
CREATININE: 0.89 mg/dL (ref 0.50–1.10)
Calcium: 9 mg/dL (ref 8.4–10.5)
Chloride: 102 mEq/L (ref 96–112)
GFR calc Af Amer: 79 mL/min — ABNORMAL LOW (ref >90)
GFR calc non Af Amer: 68 mL/min — ABNORMAL LOW (ref >90)
Glucose, Bld: 117 mg/dL — ABNORMAL HIGH (ref 70–99)
Potassium: 3.5 mEq/L — ABNORMAL LOW (ref 3.7–5.3)
Sodium: 138 mEq/L (ref 137–147)

## 2014-06-19 LAB — CBC
HCT: 30.2 % — ABNORMAL LOW (ref 36.0–46.0)
HEMOGLOBIN: 10.5 g/dL — AB (ref 12.0–15.0)
MCH: 27 pg (ref 26.0–34.0)
MCHC: 34.8 g/dL (ref 30.0–36.0)
MCV: 77.6 fL — AB (ref 78.0–100.0)
PLATELETS: 256 10*3/uL (ref 150–400)
RBC: 3.89 MIL/uL (ref 3.87–5.11)
RDW: 15.4 % (ref 11.5–15.5)
WBC: 9.9 10*3/uL (ref 4.0–10.5)

## 2014-06-19 LAB — PROTIME-INR
INR: 2.44 — ABNORMAL HIGH (ref 0.00–1.49)
Prothrombin Time: 26.7 seconds — ABNORMAL HIGH (ref 11.6–15.2)

## 2014-06-19 MED ORDER — DOXYCYCLINE HYCLATE 100 MG PO TABS: 100.0000 mg | ORAL_TABLET | Freq: Two times a day (BID) | ORAL | Status: DC

## 2014-06-19 MED ORDER — WARFARIN SODIUM 7.5 MG PO TABS
7.5000 mg | ORAL_TABLET | Freq: Once | ORAL | Status: DC
  Filled 2014-06-19: qty 1

## 2014-06-19 MED ORDER — CEPHALEXIN 500 MG PO CAPS
500.0000 mg | ORAL_CAPSULE | Freq: Four times a day (QID) | ORAL | Status: DC
Start: 2014-06-19 — End: 2014-07-16

## 2014-06-19 NOTE — Discharge Summary (Signed)
Physician Discharge Summary  [name] DVV:[id number] DOB: 01/03/1951 DOA: 06/16/2014  PCP: Ernestene Kiel, MD  Admit date: 06/16/2014 Discharge date: 06/19/2014  Time spent: 45 minutes  Recommendations for Outpatient Follow-up:  Patient will be discharged home. She is to follow up with her primary care physician within one to 2 weeks of discharge. Patient should also follow up with her oncologist. Patient should continue a heart healthy diet. She may resume normal activity as tolerated. Patient was counseled on avoiding sunlight due to her antibiotics.   Discharge Diagnoses:  Cellulitis of the right chest wall right upper extremity Chronic heart failure, unspecified (unable to determine clinically) Metastatic breast cancer Hypertension Anemia chronic disease History of DVT  Discharge Condition: Stable  Diet recommendation: Heart healthy  Filed Weights   06/16/14 1700 06/17/14 0517 06/19/14 0600  Weight: 131.09 kg (289 lb) 130.455 kg (287 lb 9.6 oz) 130.953 kg (288 lb 11.2 oz)    History of present illness:  on 06/16/2014  63 year old female with history of stage IV metastatic breast cancer to liver and lung currently undergoing chemotherapy through right-sided Port-A-Cath, history of mastectomy and radiation, CHF (normal EF of 50-60% in 02/9014, followed by Dr. Missy Sabins), hypertension, history of DVT on Coumadin who presented to the ED with acute onset of redness over her right anterior chest wall overlying the Port-A-Cath site spread towards her right shoulder and upper arm. She reported having a fever of 103.33F before admission. She denied any headache, blurred vision, dizziness, runny nose, congestion, cough, any discharge from the port. She denied any chills, nausea , vomiting, chest pain, palpitations, SOB, abdominal pain, bowel or urinary symptoms. She reported that the port was last used one week ago for chemotherapy. She denied any insect bite. She reported having  mild pain over her right shoulder. Denied any recent travel or sick contacts.  Course in the ED patient had a low-grade temperature of 99.42F. Otherwise vitals were normal. Labs done showed WBC of 12, hemoglobin of 10.5 and platelets of 203. Chemistry was unremarkable. Glucose of 116. Chest x-ray was unremarkable as well. Patient Port-A-Cath was accessed accidentally in the ED and was subsequently deaccessed. Patient given a dose of empiric IV vancomycin and hospitalist admission requested.  Hospital Course:  Cellulitis of the right chest wall and right upper extremity  -?port, which as been deaccessed, unlikely due to port as skin overlying portacath is nontender with no erythema  -The patient was started on empiric vancomycin and switched to oral doxycycline and keflex (per ID) -Blood cultures remain negative  -Erythema and edema have improved  -Likely related to DVT as patient does have a therapeutic INR, 2.14  -Patient currently afebrile with no leukocytosis  -Infectious disease consulted and appreciated and recommended doxycycline and keflex  Chronic heart failure, unspecified (unable to determine clinically)  -Echocardiogram in July 2015 shows an EF 62-69%, normal diastolic function  -Patient appears euvolemic  -Monitored daily weights and intake and output.  -Continue home medications of Lasix, losartan, Aldactone   Metastatic breast cancer  -Patient follows with Dr. Jana Hakim   Hypertension  -Continue Cozaar, Lasix, amlodipine   Anemia of chronic disease  -Hemoglobin appears stable, continue to monitor CBC   History of DVT  -Continue Coumadin per pharmacy  Procedures:  None  Consultations:  Oncology  Infectious disease  Discharge Exam: Filed Vitals:   06/19/14 0600  BP: 110/87  Pulse: 75  Temp: 98.4 F (36.9 C)  Resp: 20   Exam  General: Well developed, well nourished,  NAD, appears stated age  27: NCAT, mucous membranes moist.  Cardiovascular: S1 S2  auscultated, no rubs, murmurs or gallops. Regular rate and rhythm.  Respiratory: Clear to auscultation bilaterally with equal chest rise  Abdomen: Soft, obese, nontender, nondistended, + bowel sounds  Extremities: warm dry without cyanosis clubbing or edema  Neuro: AAOx3, no focal deficits  Skin: Erythema on chest wall and right arm improving  Psych: Appropriate mood and affect  Discharge Instructions      Discharge Instructions   Discharge instructions    Complete by:  As directed   Patient will be discharged home. She is to follow up with her primary care physician within one to 2 weeks of discharge. Patient should also follow up with her oncologist. Patient should continue a heart healthy diet. She may resume normal activity as tolerated. Patient was counseled on avoiding sunlight due to her antibiotics.            Medication List         acetaminophen 500 MG tablet  Commonly known as:  TYLENOL  Take 1,000 mg by mouth every 6 (six) hours as needed for mild pain or fever.     albuterol 108 (90 BASE) MCG/ACT inhaler  Commonly known as:  PROVENTIL HFA;VENTOLIN HFA  Inhale 2 puffs into the lungs every 6 (six) hours as needed for wheezing.     ALPRAZolam 1 MG tablet  Commonly known as:  XANAX  Take 1 mg by mouth 3 (three) times daily as needed for anxiety.     amLODipine 10 MG tablet  Commonly known as:  NORVASC  Take 10 mg by mouth every morning.     baclofen 10 MG tablet  Commonly known as:  LIORESAL  Take 10 mg by mouth 3 (three) times daily as needed for muscle spasms.     cephALEXin 500 MG capsule  Commonly known as:  KEFLEX  Take 1 capsule (500 mg total) by mouth every 6 (six) hours.     diclofenac sodium 1 % Gel  Commonly known as:  VOLTAREN  Apply 2 g topically daily as needed (for pain). Apply to knees and shoulders     doxycycline 100 MG tablet  Commonly known as:  VIBRA-TABS  Take 1 tablet (100 mg total) by mouth every 12 (twelve) hours.     furosemide  80 MG tablet  Commonly known as:  LASIX  Take 80 mg by mouth daily as needed for fluid.     gabapentin 300 MG capsule  Commonly known as:  NEURONTIN  Take 600 mg by mouth 2 (two) times daily.     losartan 100 MG tablet  Commonly known as:  COZAAR  Take 100 mg by mouth every morning.     potassium chloride 10 MEQ tablet  Commonly known as:  K-DUR,KLOR-CON  Take 10 mEq by mouth 2 (two) times daily.     PRESCRIPTION MEDICATION  Chemo - [facility]     prochlorperazine 10 MG tablet  Commonly known as:  COMPAZINE  Take 1 tablet (10 mg total) by mouth every 6 (six) hours as needed (for nausea).     spironolactone 25 MG tablet  Commonly known as:  ALDACTONE  Take 0.5 tablets (12.5 mg total) by mouth daily.     temazepam 30 MG capsule  Commonly known as:  RESTORIL  Take 1 capsule (30 mg total) by mouth at bedtime as needed for sleep.     traMADol 50 MG tablet  Commonly known as:  ULTRAM  Take 2 tablets (100 mg total) by mouth every 6 (six) hours as needed.     warfarin 5 MG tablet  Commonly known as:  COUMADIN  Take 5-7.5 mg by mouth See admin instructions. Takes 7.5mg  everyday except 5mg  on Friday's       Allergies  Allergen Reactions  . Penicillins Hives    PATIENT HAS TOLERATED CEPHALOSPORINGS  . Adhesive [Tape] Other (See Comments)    Tears skin    Follow-up Information   Follow up with PROCHNAU,CAROLINE, MD. Schedule an appointment as soon as possible for a visit in 1 week. [facility] followup)    Specialty:  Internal Medicine   Contact information:   [address] [place] [address] [phone]       Schedule an appointment as soon as possible for a visit with Chauncey Cruel, MD. (As needed)    Specialty:  Oncology   Contact information:   [place] [address] [phone]        The results of significant diagnostics from this hospitalization (including imaging, microbiology, ancillary and laboratory) are listed below for reference.     Significant Diagnostic Studies: Dg Chest 2 View  06/16/2014   CLINICAL DATA:  Cancer.  Fever.  Abdominal pain  EXAM: CHEST  2 VIEW  COMPARISON:  06/01/2014  FINDINGS: Right chest wall port a catheter is noted with tip in the projection of the cavoatrial junction. The heart size appears mildly enlarged. There is no pleural effusion or edema. No airspace consolidation identified.  IMPRESSION: 1. Mild cardiac enlargement.   Electronically Signed   By: Kerby Moors M.D.   On: 06/16/2014 11:21   Dg Cervical Spine Complete  06/01/2014   CLINICAL DATA:  Right-sided neck pain with right upper extremity paresthesias. No injury.  EXAM: CERVICAL SPINE  4+ VIEWS  FINDINGS: Vertebral body alignment and heights are normal. There is mild spondylosis throughout the cervical spinal and. No significant disc space narrowing. Prevertebral soft tissues are within normal. Minimal left-sided neural foraminal narrowing at the C4-5, C5-6 and C6-7 levels. There is uncovertebral joint spurring and facet arthropathy. The atlantoaxial articulation is within normal. Right IJ central venous catheter is present.  IMPRESSION: Mild spondylosis throughout the cervical spine. Mild left-sided neural foraminal narrowing from the C4-5 level to the C6-7 level.   Electronically Signed   By: Marin Olp M.D.   On: 06/01/2014 09:36   Ct Chest W Contrast  06/16/2014   CLINICAL DATA:  Stage IV metastatic breast carcinoma with known history of liver and lung metastatic disease. Acute onset of redness and pain over the right chest, recent fever  EXAM: CT CHEST WITH CONTRAST  TECHNIQUE: Multidetector CT imaging of the chest was performed during intravenous contrast administration.  CONTRAST:  61mL OMNIPAQUE IOHEXOL 300 MG/ML  SOLN  COMPARISON:  06/01/2014  FINDINGS: The lungs are well aerated bilaterally. No focal infiltrate or sizable effusion is seen. No sizable parenchymal mass lesion is noted. Very mild stable scarring is noted in the right  upper lobe anteriorly. This may be related to the patient's known history of prior radiation therapy.  There are changes consistent with bilateral mastectomy. A stable postoperative fluid collection is noted on the left. Some scarring is noted in the right axilla also stable from the recent exam. In the right axilla however, there is increased density within the axillary fat which was not present on the recent exam from 15 days previous. This is likely related to  the patient's known cellulitis. The superior vena cava and visualized right subclavian vein appear within normal limits. The subclavian vein on the left is patent.  The hilar and mediastinal structures show no significant lymphadenopathy. Scanning into the upper abdomen reveals changes of prior cholecystectomy. No definitive mass lesion is identified. The overall appearance is stable from the recent exam. A stable lipoma is noted in the right infraspinatus muscle. No acute bony abnormality is seen.  IMPRESSION: Postsurgical changes consistent with the patient's given clinical history.  Increase in the density within the fat of the right axilla. This is new from the recent PET-CT. This corresponds with the patient's given clinical history of cellulitis.  Chronic changes as described above stable from the prior exam.   Electronically Signed   By: Inez Catalina M.D.   On: 06/16/2014 15:09   Nm Pet Image Restag (ps) Skull Base To Thigh  06/01/2014   CLINICAL DATA:  Subsequent treatment strategy for metastatic breast cancer.  EXAM: NUCLEAR MEDICINE PET SKULL BASE TO THIGH  TECHNIQUE: 14.3 mCi F-18 FDG was injected intravenously. Full-ring PET imaging was performed from the skull base to thigh after the radiotracer. CT data was obtained and used for attenuation correction and anatomic localization.  FASTING BLOOD GLUCOSE:  Value: 104 mg/dl  COMPARISON:  08/24/2013  FINDINGS: NECK  Just above the hyoid bone there is a left sided level 2 lymph node measuring 0.8  x 1.6 cm asymmetric increased uptake within this lymph node has an SUV max equal to 4.8. This is new since the previous exam.  CHEST  No hypermetabolic mediastinal or hilar nodes. No suspicious pulmonary nodules on the CT scan. Status post bilateral mastectomy. No hypermetabolic axillary or supraclavicular lymph nodes.  ABDOMEN/PELVIS  No abnormal hypermetabolic activity within the liver, pancreas, adrenal glands, or spleen. No hypermetabolic lymph nodes in the abdomen or pelvis. Similar appearance of peripherally calcified 3.6 cm right adnexal lesion.  SKELETON  No focal hypermetabolic activity to suggest skeletal metastasis.  IMPRESSION: 1. No abnormal uptake highly specific for residual tumor or metastatic disease is identified. 2. Single, left level 2 lymph node exhibits mild increased uptake within SUV max equal to 4.8. This is favored to be reactive. In the absence of additional sites of abnormal uptake metastatic adenopathy is considered less favored. Attention to this area on followup imaging would be recommended.   Electronically Signed   By: Kerby Moors M.D.   On: 06/01/2014 12:36    Microbiology: Recent Results (from the past 240 hour(s))  CULTURE, BLOOD (ROUTINE X 2)     Status: None   Collection Time    06/16/14  3:45 PM      Result Value Ref Range Status   Specimen Description BLOOD LEFT ARM   Final   Special Requests BOTTLES DRAWN AEROBIC AND ANAEROBIC 4CC   Final   Culture  Setup Time     Final   Value: 06/16/2014 20:28     Performed at [facility]   Culture     Final   Value:        BLOOD CULTURE RECEIVED NO GROWTH TO DATE CULTURE WILL BE HELD FOR 5 DAYS BEFORE ISSUING A FINAL NEGATIVE REPORT     Performed at [facility]   Report Status PENDING   Incomplete  CULTURE, BLOOD (ROUTINE X 2)     Status: None   Collection Time    06/16/14  9:45 PM      Result Value Ref  Range Status   Specimen Description BLOOD LEFT ANTECUBITAL   Final   Special Requests      Final   Value: BOTTLES DRAWN AEROBIC AND ANAEROBIC 5CC IMMUNOCOMPROMISED   Culture  Setup Time     Final   Value: 06/17/2014 03:44     Performed at [facility]   Culture     Final   Value:        BLOOD CULTURE RECEIVED NO GROWTH TO DATE CULTURE WILL BE HELD FOR 5 DAYS BEFORE ISSUING A FINAL NEGATIVE REPORT     Performed at [facility]   Report Status PENDING   Incomplete  CLOSTRIDIUM DIFFICILE BY PCR     Status: None   Collection Time    06/17/14 10:49 AM      Result Value Ref Range Status   C difficile by pcr NEGATIVE  NEGATIVE Final   Comment: Performed at [place]: Basic Metabolic Panel:  Recent Labs Lab 06/16/14 1148 06/17/14 0510 06/19/14 0500  NA 141 141 138  K 3.9 3.7 3.5*  CL 105 108 102  CO2 23 23 24   GLUCOSE 116* 103* 117*  BUN 14 12 11   CREATININE 0.97 0.85 0.89  CALCIUM 8.9 8.8 9.0   Liver Function Tests: No results found for this basename: AST, ALT, ALKPHOS, BILITOT, PROT, ALBUMIN,  in the last 168 hours No results found for this basename: LIPASE, AMYLASE,  in the last 168 hours No results found for this basename: AMMONIA,  in the last 168 hours CBC:  Recent Labs Lab 06/16/14 1148 06/17/14 0510 06/19/14 0500  WBC 12.0* 10.8* 9.9  NEUTROABS 9.8*  --   --   HGB 10.5* 10.4* 10.5*  HCT 30.0* 30.2* 30.2*  MCV 77.7* 79.5 77.6*  PLT 203 198 256   Cardiac Enzymes: No results found for this basename: CKTOTAL, CKMB, CKMBINDEX, TROPONINI,  in the last 168 hours BNP: BNP (last 3 results) No results found for this basename: PROBNP,  in the last 8760 hours CBG: No results found for this basename: GLUCAP,  in the last 168 hours     Signed:  Cristal Ford  [facility] 06/19/2014, 9:40 AM

## 2014-06-19 NOTE — Discharge Instructions (Signed)

## 2014-06-19 NOTE — Progress Notes (Signed)
ANTICOAGULATION CONSULT NOTE - follow up  Pharmacy Consult for Warfarin Indication: hx of DVT  Assessment (10/23): 58 yoF with hx of Stage IV Breast Ca, mets to liver, lung. Hx of DVT on chronic Warfarin. Presented to ED with acute onset of redness over R chest wall, shoulder and upper arm. Port-A-Cath on right, not accessed. Chemotherapy Regimen: Herceptin q 21 days, last cycle 31 received 10/8. Last anti-coag clinic visit 10/8 with INR of 3.0.  Patient switched yesterday from IV vancomycin to PO antibiotics including doxycycline, which moderately potentiates the effect of warfarin.   Home dose 7.5mg  daily except 5mg  on Fridays  Today, 10/24:  INR therapeutic; stable  CBC stable; Hgb slightly low; Plt wnl  Cardiac diet; eating 100% of meals    Goal of Therapy:  INR 2-3   Plan:   Continue with home dose regimen: warfarin 7.5 mg x 1 at 1800 tonight  Daily PT/INR ordered  CBC at least q72 hr while on warfarin  F/u INR closely while on antibiotics, esp. doxycycline.  ----------------------------------------------------------------------------------  Allergies  Allergen Reactions  . Penicillins Hives    PATIENT HAS TOLERATED CEPHALOSPORINGS  . Adhesive [Tape] Other (See Comments)    Tears skin    Patient Measurements: Height: 5\' 3"  (160 cm) Weight: 288 lb 11.2 oz (130.953 kg) IBW/kg (Calculated) : 52.4 Total body weight: 132 kg  Vital Signs: Temp: 98.4 F (36.9 C) (10/24 0600) Temp Source: Oral (10/24 0600) BP: 110/87 mmHg (10/24 0600) Pulse Rate: 75 (10/24 0600)  Labs:  Recent Labs  06/16/14 1148  06/17/14 0510 06/18/14 0420 06/19/14 0500  HGB 10.5*  --  10.4*  --  10.5*  HCT 30.0*  --  30.2*  --  30.2*  PLT 203  --  198  --  256  LABPROT  --   < > 24.6* 24.1* 26.7*  INR  --   < > 2.20* 2.14* 2.44*  CREATININE 0.97  --  0.85  --  0.89  < > = values in this interval not displayed. Estimated Creatinine Clearance: 86.7 ml/min (by C-G formula based on  Cr of 0.89).  Medications:  Scheduled:  . amLODipine  10 mg Oral q morning - 10a  . cephALEXin  500 mg Oral 4 times per day  . doxycycline  100 mg Oral Q12H  . gabapentin  600 mg Oral BID  . losartan  100 mg Oral q morning - 10a  . potassium chloride  10 mEq Oral BID  . sodium chloride  3 mL Intravenous Q12H  . spironolactone  12.5 mg Oral Daily  . Warfarin - Pharmacist Dosing Inpatient   Does not apply q1800   Anti-infectives   Start     Dose/Rate Route Frequency Ordered Stop   06/19/14 0000  cephALEXin (KEFLEX) 500 MG capsule     500 mg Oral Every 6 hours 06/19/14 0940     06/19/14 0000  doxycycline (VIBRA-TABS) 100 MG tablet     100 mg Oral Every 12 hours 06/19/14 0940     06/18/14 1345  cephALEXin (KEFLEX) capsule 500 mg     500 mg Oral 4 times per day 06/18/14 1332     06/18/14 1345  doxycycline (VIBRA-TABS) tablet 100 mg     100 mg Oral Every 12 hours 06/18/14 1333     06/16/14 2200  vancomycin (VANCOCIN) 1,250 mg in sodium chloride 0.9 % 250 mL IVPB  Status:  Discontinued     1,250 mg 166.7 mL/hr over 90 Minutes  Intravenous Every 12 hours 06/16/14 1453 06/18/14 1332   06/16/14 1130  vancomycin (VANCOCIN) 2,000 mg in sodium chloride 0.9 % 500 mL IVPB     2,000 mg 250 mL/hr over 120 Minutes Intravenous  Once 06/16/14 1129 06/16/14 [place], PharmD Pager: [phone] 06/19/2014, 9:56 AM

## 2014-06-22 LAB — CULTURE, BLOOD (ROUTINE X 2): Culture: NO GROWTH

## 2014-06-22 LAB — HIV-1 RNA QUANT-NO REFLEX-BLD: HIV-1 RNA Quant, Log: <1.3 {Log} (ref <1.30)

## 2014-06-23 LAB — CULTURE, BLOOD (ROUTINE X 2): CULTURE: NO GROWTH

## 2014-06-24 ENCOUNTER — Other Ambulatory Visit: Payer: Commercial Managed Care - HMO

## 2014-06-24 ENCOUNTER — Other Ambulatory Visit: Payer: Self-pay | Admitting: Oncology

## 2014-06-24 ENCOUNTER — Ambulatory Visit: Payer: Commercial Managed Care - HMO

## 2014-06-24 ENCOUNTER — Ambulatory Visit (HOSPITAL_BASED_OUTPATIENT_CLINIC_OR_DEPARTMENT_OTHER): Payer: Commercial Managed Care - HMO

## 2014-06-24 ENCOUNTER — Telehealth: Payer: Self-pay | Admitting: Hematology and Oncology

## 2014-06-24 ENCOUNTER — Ambulatory Visit (HOSPITAL_BASED_OUTPATIENT_CLINIC_OR_DEPARTMENT_OTHER): Payer: Commercial Managed Care - HMO | Admitting: Oncology

## 2014-06-24 ENCOUNTER — Other Ambulatory Visit (HOSPITAL_BASED_OUTPATIENT_CLINIC_OR_DEPARTMENT_OTHER): Payer: Commercial Managed Care - HMO

## 2014-06-24 ENCOUNTER — Ambulatory Visit: Payer: Commercial Managed Care - HMO | Admitting: Pharmacist

## 2014-06-24 VITALS — BP 173/92 | HR 85 | Temp 98.8°F | Resp 18 | Ht 63.0 in | Wt 287.5 lb

## 2014-06-24 DIAGNOSIS — C787 Secondary malignant neoplasm of liver and intrahepatic bile duct: Secondary | ICD-10-CM

## 2014-06-24 DIAGNOSIS — C7951 Secondary malignant neoplasm of bone: Secondary | ICD-10-CM

## 2014-06-24 DIAGNOSIS — Z5112 Encounter for antineoplastic immunotherapy: Secondary | ICD-10-CM

## 2014-06-24 DIAGNOSIS — C50819 Malignant neoplasm of overlapping sites of unspecified female breast: Secondary | ICD-10-CM

## 2014-06-24 DIAGNOSIS — Z7901 Long term (current) use of anticoagulants: Secondary | ICD-10-CM

## 2014-06-24 DIAGNOSIS — I871 Compression of vein: Secondary | ICD-10-CM

## 2014-06-24 DIAGNOSIS — Z95828 Presence of other vascular implants and grafts: Secondary | ICD-10-CM

## 2014-06-24 DIAGNOSIS — L03313 Cellulitis of chest wall: Secondary | ICD-10-CM

## 2014-06-24 DIAGNOSIS — C50919 Malignant neoplasm of unspecified site of unspecified female breast: Secondary | ICD-10-CM

## 2014-06-24 DIAGNOSIS — C50911 Malignant neoplasm of unspecified site of right female breast: Secondary | ICD-10-CM

## 2014-06-24 LAB — COMPREHENSIVE METABOLIC PANEL (CC13)
ALK PHOS: 88 U/L (ref 40–150)
ALT: 20 U/L (ref 0–55)
AST: 16 U/L (ref 5–34)
Albumin: 3.5 g/dL (ref 3.5–5.0)
Anion Gap: 9 mEq/L (ref 3–11)
BUN: 12.2 mg/dL (ref 7.0–26.0)
CO2: 25 mEq/L (ref 22–29)
Calcium: 9.8 mg/dL (ref 8.4–10.4)
Chloride: 108 mEq/L (ref 98–109)
Creatinine: 0.9 mg/dL (ref 0.6–1.1)
Glucose: 111 mg/dl (ref 70–140)
Potassium: 3.7 mEq/L (ref 3.5–5.1)
SODIUM: 141 meq/L (ref 136–145)
TOTAL PROTEIN: 8.5 g/dL — AB (ref 6.4–8.3)
Total Bilirubin: 0.47 mg/dL (ref 0.20–1.20)

## 2014-06-24 LAB — CBC WITH DIFFERENTIAL/PLATELET
BASO%: 1 % (ref 0.0–2.0)
BASOS ABS: 0.1 10*3/uL (ref 0.0–0.1)
EOS%: 1.9 % (ref 0.0–7.0)
Eosinophils Absolute: 0.2 10*3/uL (ref 0.0–0.5)
HCT: 36 % (ref 34.8–46.6)
HGB: 11.9 g/dL (ref 11.6–15.9)
LYMPH%: 31.3 % (ref 14.0–49.7)
MCH: 26.6 pg (ref 25.1–34.0)
MCHC: 33 g/dL (ref 31.5–36.0)
MCV: 80.5 fL (ref 79.5–101.0)
MONO#: 0.6 10*3/uL (ref 0.1–0.9)
MONO%: 5.4 % (ref 0.0–14.0)
NEUT%: 60.4 % (ref 38.4–76.8)
NEUTROS ABS: 6.9 10*3/uL — AB (ref 1.5–6.5)
PLATELETS: 342 10*3/uL (ref 145–400)
RBC: 4.47 10*6/uL (ref 3.70–5.45)
RDW: 15.9 % — AB (ref 11.2–14.5)
WBC: 11.5 10*3/uL — AB (ref 3.9–10.3)
lymph#: 3.6 10*3/uL — ABNORMAL HIGH (ref 0.9–3.3)

## 2014-06-24 LAB — PROTIME-INR
INR: 3.9 — ABNORMAL HIGH (ref 2.00–3.50)
PROTIME: 46.8 s — AB (ref 10.6–13.4)

## 2014-06-24 LAB — POCT INR: INR: 3.9

## 2014-06-24 MED ORDER — SODIUM CHLORIDE 0.9 % IJ SOLN
10.0000 mL | INTRAMUSCULAR | Status: DC | PRN
  Administered 2014-06-24: 10 mL
  Filled 2014-06-24: qty 10

## 2014-06-24 MED ORDER — SODIUM CHLORIDE 0.9 % IJ SOLN
10.0000 mL | INTRAMUSCULAR | Status: DC | PRN
  Administered 2014-06-24: 10 mL via INTRAVENOUS
  Filled 2014-06-24: qty 10

## 2014-06-24 MED ORDER — LORAZEPAM 2 MG/ML IJ SOLN: 1.0000 mg | Freq: Once | INTRAMUSCULAR | Status: DC | PRN

## 2014-06-24 MED ORDER — ACETAMINOPHEN 325 MG PO TABS
ORAL_TABLET | ORAL | Status: AC
  Filled 2014-06-24: qty 2

## 2014-06-24 MED ORDER — SODIUM CHLORIDE 0.9 % IV SOLN
Freq: Once | INTRAVENOUS | Status: AC
  Administered 2014-06-24: 15:00:00 via INTRAVENOUS

## 2014-06-24 MED ORDER — TRASTUZUMAB CHEMO INJECTION 440 MG
6.0000 mg/kg | Freq: Once | INTRAVENOUS | Status: AC
  Administered 2014-06-24: 756 mg via INTRAVENOUS
  Filled 2014-06-24: qty 36

## 2014-06-24 MED ORDER — ACETAMINOPHEN 325 MG PO TABS
650.0000 mg | ORAL_TABLET | Freq: Once | ORAL | Status: AC
  Administered 2014-06-24: 650 mg via ORAL

## 2014-06-24 MED ORDER — DIPHENHYDRAMINE HCL 25 MG PO CAPS
ORAL_CAPSULE | ORAL | Status: AC
  Filled 2014-06-24: qty 2

## 2014-06-24 MED ORDER — HEPARIN SOD (PORK) LOCK FLUSH 100 UNIT/ML IV SOLN
500.0000 [IU] | Freq: Once | INTRAVENOUS | Status: AC | PRN
  Administered 2014-06-24: 500 [IU]
  Filled 2014-06-24: qty 5

## 2014-06-24 MED ORDER — DIPHENHYDRAMINE HCL 25 MG PO CAPS
50.0000 mg | ORAL_CAPSULE | Freq: Once | ORAL | Status: AC
  Administered 2014-06-24: 50 mg via ORAL

## 2014-06-24 NOTE — Patient Instructions (Signed)

## 2014-06-24 NOTE — Patient Instructions (Signed)
Hold coumadin today and on 06/25/14. On 06/26/14, continue taking 7.5mg  daily except 5mg  on Fridays.  Recheck INR in 2 weeks on 07/09/14 with lab at 9:30am and coumadin clinic at 9:45am.  Pt is scheduled for ECHO in [place] at 11am that day.

## 2014-06-24 NOTE — Patient Instructions (Signed)

## 2014-06-24 NOTE — Progress Notes (Signed)
[place] [place]  [phone] [phone]  OFFICE PROGRESS NOTE  ID: KENNIDY LAMKE   DOB: 06-May-1951  MR#: [id number]  JQG#:[id number]  PCP: Ernestene Kiel, MD GYN:  SU:  OTHER MD: Pierre Bali, Johnnette Gourd, Wendee Copp  CC: Metastatic HER-2 positive breast cancer.  CURRENT TREATMENT: Trastuzumab   HISTORY OF PRESENT ILLNESS:   From the earlier summary:  [name] is 63 years old [name] female.  This woman has been in good health all of her life.  She noted a swelling and discomfort in her right breast in June 2004.  She was seen in the Emergency Room in [place] and was treated for mastitis.  She was treated for a number of months with mastitis and the swelling did not get better. She was given hydrocodone and Cipro.  Finally, the swelling did get better and ultimately the nipple became retracted and she noticed some dimpling in her skin. She had a mammogram in July of 2004 in [place] with subsequent mammogram on May 27, 2003, by Dr. Isaiah Blakes.  Mammogram done on September 30 showed marked increased density in the left breast.  Biopsy was performed the same day.  It was noted at the 12 o'clock position, deep in the breast was a focal hypoechoic mass, at least 3.5 cm in diameter.  Biopsy did in fact show invasive in situ mammary carcinoma. This was felt to be both at least intermediate, high grade.  No definite lymphovascular invasion was identified. ER and PR negative, Her2 testing positive. Parthenia continues to have pain in her breast.  She continues to take hydrocodone a number of times a day.  She has been seen by Dr. Rosana Hoes, who felt that neoadjuvant chemotherapy would be required.    Initial staging studies showed evidence of liver and lung mets.   Patient also has evidence of bone lesions. Patient started neoadjuvant chemotherapy, Taxotere/Carbo/Herceptin in October 2004.   Patient had a CT scan in December 2004  which demonstrated extensive clot in the SVC innominate vein, bilateral jugular vein and  She was started on anticoagulation therapy. She received a total of 6 cycles of Taxotere/Carbo/Herceptin, completed in April 2005."  Patient has been on  trastuzumab continued indefinitely; has also received lapatinib and capecitabine for variable intervals in 2007-2008. Most recent echo 12/01/2013 showed an ejection fraction of 55%. She is status post bilateral mastectomies with bilateral axillary lymph node dissection 12/07/2004, showing (a) on the right, a mypT1c ypN1 invasive ductal carcinoma, grade 3, estrogen and progesterone receptor negative, HER-2 positive, with an MIB-1 of 31% (b) on the left, ypT2 ypN1 invasive ductal carcinoma, grade 2, estrogen and progesterone receptor negative, HER-2 positive, with an MIB-1 of 35%. She is Status post radiation June through July of 2006, to the right chest wall, left chest wall, bilateral supraclavicular fossae, and bilateral axillary boosts; with additional radiation to the right and left chest walls and the central chest wall completed November of 2007. She is status post ixempra x9 completed August of 2009. 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 Patient has chronic pain, with negative PET scan 08/24/2013 (no evidence of active cancer). On Neurontin and Tramadol therapy.  Her subsequent history is as detailed below  INTERVAL HISTORY: Jessice returns today for followup of her stage IV breast cancer accompanied by her husband, Abe People. Since her last visit here she developed a significant cellulitis involving the right upper arm  and right anterior chest wall. There was a significant concern that she might have yet another involvement of her port. She was evaluated with a CT scan of the chest which showed only some skin edema, and as cultures were negative she was treated with cephalexin  and doxycycline for 2 weeks, which will be completed 06/28/2014. She has had a very good response to treatment and is here today to resume trastuzumab.  REVIEW OF SYSTEMS: She has had no bleeding, although doubtless her INR will be high given the fact that she has been on antibiotics. She has some loose bowel movements but no frank diarrhea. She tells me she is back to baseline at this point. Her chronic pain is well-controlled on her current medications she denies any fever. A detailed review of systems was otherwise noncontributory  PAST MEDICAL HISTORY: Past Medical History  Diagnosis Date  . Breast cancer     mets to liver and lung  . Hypertension   . SVC syndrome   . History of chemotherapy Feb. 2006    taxotere/herceptin/carboplatin  . Radiation 07/31/2006    left upper chest  . Radiation 06/17/2006-06/27/2006    6480 cGy bilat. chest wall  . Neuropathy   . Thrombosis   . Breast cancer metastasized to multiple sites 02/26/2013    PAST SURGICAL HISTORY: Past Surgical History  Procedure Laterality Date  . Tubal ligation  1986  . Cholecystectomy  1989  . Mastectomy Bilateral   . Ankle surgery    . Peripherally inserted central catheter insertion    . Back surgery      FAMILY HISTORY Family History  Problem Relation Age of Onset  . Heart failure Father   . Cancer Father     Prostate cancer  . Heart failure Brother   . Cancer Brother     Prostate cancer  . Diabetes Maternal Aunt    She had three brothers, one died of gunshot wound, one of complications of diabetes mellitus and one of myocardial infarction.  She has no sisters.  Mother died of complications of brain metastasis in 65.  Father has had a myocardial infarction in 1999.  No history of breast or ovarian cancer in the family.     GYNECOLOGIC HISTORY:   Menarche at age 80.  Gravida 3, para 3.  First live birth at age 57.  No history of breast feeding. No history of hormonal replacement therapy.   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 Her husband used to work as a Art gallery manager, but is now retired. She has three children, Monette who lives in [place] and works as a Hydrographic surveyor, Financial risk analyst who lives in [place] and works as a Administrator, and Wallace who lives in [place] and also works as a Hydrographic surveyor. The patient has 12 grandchildren and 4 great-grandchildren. She attends a [employer]. Very involved with school kids.  ADVANCED DIRECTIVES:  Not in place  HEALTH MAINTENANCE: (Updated 06/12/2013) History  Substance Use Topics  . Smoking status: Never Smoker   . Smokeless tobacco: Never Used  . Alcohol Use: Yes     Comment: occasional     Colonoscopy: Never and "I don't want one"  PAP:  1987  Bone density:  Never  Lipid panel:  Not on file    Allergies  Allergen Reactions  . Penicillins Hives    PATIENT HAS TOLERATED CEPHALOSPORINGS  . Adhesive [  Tape] Other (See Comments)    Tears skin     Current Outpatient Prescriptions  Medication Sig Dispense Refill  . acetaminophen (TYLENOL) 500 MG tablet Take 1,000 mg by mouth every 6 (six) hours as needed for mild pain or fever.       Marland Kitchen albuterol (PROVENTIL HFA;VENTOLIN HFA) 108 (90 BASE) MCG/ACT inhaler Inhale 2 puffs into the lungs every 6 (six) hours as needed for wheezing.  1 Inhaler  3  . ALPRAZolam (XANAX) 1 MG tablet Take 1 mg by mouth 3 (three) times daily as needed for anxiety.      Marland Kitchen amLODipine (NORVASC) 10 MG tablet Take 10 mg by mouth every morning.      . baclofen (LIORESAL) 10 MG tablet Take 10 mg by mouth 3 (three) times daily as needed for muscle spasms.       . cephALEXin (KEFLEX) 500 MG capsule Take 1 capsule (500 mg total) by mouth every 6 (six) hours.  52 capsule  0  . diclofenac sodium (VOLTAREN) 1 % GEL Apply 2 g topically daily as needed (for pain). Apply to knees and shoulders  100 g  6  . doxycycline (VIBRA-TABS) 100 MG tablet Take 1  tablet (100 mg total) by mouth every 12 (twelve) hours.  26 tablet  0  . furosemide (LASIX) 80 MG tablet Take 80 mg by mouth daily as needed for fluid.       Marland Kitchen gabapentin (NEURONTIN) 300 MG capsule Take 600 mg by mouth 2 (two) times daily.       Marland Kitchen losartan (COZAAR) 100 MG tablet Take 100 mg by mouth every morning.      . potassium chloride (K-DUR,KLOR-CON) 10 MEQ tablet Take 10 mEq by mouth 2 (two) times daily.      Marland Kitchen PRESCRIPTION MEDICATION Chemo - [facility]      . prochlorperazine (COMPAZINE) 10 MG tablet Take 1 tablet (10 mg total) by mouth every 6 (six) hours as needed (for nausea).  30 tablet  3  . spironolactone (ALDACTONE) 25 MG tablet Take 0.5 tablets (12.5 mg total) by mouth daily.  30 tablet  3  . temazepam (RESTORIL) 30 MG capsule Take 1 capsule (30 mg total) by mouth at bedtime as needed for sleep.  30 capsule  1  . traMADol (ULTRAM) 50 MG tablet Take 2 tablets (100 mg total) by mouth every 6 (six) hours as needed.  120 tablet  1  . warfarin (COUMADIN) 5 MG tablet Take 5-7.5 mg by mouth See admin instructions. Takes 7.65m everyday except 5108mon Friday's       No current facility-administered medications for this visit.   Facility-Administered Medications Ordered in Other Visits  Medication Dose Route Frequency Provider Last Rate Last Dose  . sodium chloride 0.9 % injection 10 mL  10 mL Intravenous PRN GuChauncey CruelMD   10 mL at 12/17/13 1103    OBJECTIVE:  Middle aged African American woman who appears stated age Fi37itals:   06/24/14 1414  BP: 173/92  Pulse: 85  Temp: 98.8 F (37.1 C)  Resp: 18     Body mass index is 50.94 kg/(m^2).    ECOG FS: 1 Filed Weights   06/24/14 1414  Weight: 287 lb 8 oz (130.409 kg)   Filed Weights   06/24/14 1414  Weight: 287 lb 8 oz (130.409 kg)   Sclerae unicteric, EOMs intact Oropharynx clear and moist No cervical or supraclavicular adenopathy Lungs no rales or rhonchi Heart regular rate and  rhythm Abd soft, obese, nontender,  positive bowel sounds MSK no focal spinal tenderness Neuro: nonfocal, well oriented, positive affect Breasts: Status post bilateral mastectomies. The erythema and swelling in the right upper arm and chest wall has completely resolved. The area of the port is not tender or swollen. There is no evidence of local recurrence to the chest wall. Both axillae are benign.   LAB RESULTS: Lab Results  Component Value Date   WBC 11.5* 06/24/2014   NEUTROABS 6.9* 06/24/2014   HGB 11.9 06/24/2014   HCT 36.0 06/24/2014   MCV 80.5 06/24/2014   PLT 342 06/24/2014      Chemistry      Component Value Date/Time   NA 138 06/19/2014 0500   NA 141 06/03/2014 0822   K 3.5* 06/19/2014 0500   K 3.8 06/03/2014 0822   CL 102 06/19/2014 0500   CL 104 01/30/2013 0850   CO2 24 06/19/2014 0500   CO2 26 06/03/2014 0822   BUN 11 06/19/2014 0500   BUN 12.9 06/03/2014 0822   CREATININE 0.89 06/19/2014 0500   CREATININE 0.8 06/03/2014 0822      Component Value Date/Time   CALCIUM 9.0 06/19/2014 0500   CALCIUM 9.9 06/03/2014 0822   ALKPHOS 97 06/03/2014 0822   ALKPHOS 87 04/11/2012 0837   AST 11 06/03/2014 0822   AST 19 04/11/2012 0837   ALT 11 06/03/2014 0822   ALT 14 04/11/2012 0837   BILITOT 0.44 06/03/2014 0822   BILITOT 0.8 04/11/2012 0837       ASSESSMENT: 63 y.o.  [place], [place], woman  (1)  with a history of inflammatory right breast cancer metastatic at presentation September 2004 with involvement of liver and bone, HER-2 positive, estrogen and progesterone receptor negative  (2) treated with carboplatin, docetaxel and Herceptin x 6 completed April 2005  (3) trastuzumab continued indefinitely; has also received lapatinib and capecitabine for variable intervals in 2007-2008. Most recent echo 03/02/2014 showed an ejection fraction of 55-60%.  (4) status post bilateral mastectomies with bilateral axillary lymph node dissection 12/07/2004, showing  (a) on the right, a mypT1c ypN1 invasive ductal  carcinoma, grade 3, estrogen and progesterone receptor negative, HER-2 positive, with an MIB-1 of 31%  (b) on the left, ypT2 ypN1 invasive ductal carcinoma, grade 2, estrogen and progesterone receptor negative, HER-2 positive, with an MIB-1 of 35%.  (5)  Status post radiation June through July of 2006, to the right chest wall, left chest wall, bilateral supraclavicular fossae, and bilateral axillary boosts; with additional radiation to the right and left chest walls and the central chest wall completed November of 2007  (6) status post Ixempra x9 completed August of 2009.  (7) history of superior vena caval syndrome, on life long anticoagulation   (8)  History of chemotherapy-induced neuropathy.   (9)  chronic pain, with negative PET scan 08/24/2013 (no evidence of active cancer). On Neurontin and Tramadol  (10) right upper extremity cellulitis, no bacteremia; treated with cephalexin /doxycycline for 2 weeks, with resolution   PLAN: Low that is pretty much back to baseline except for the stomach upset and diarrhea due to the antibiotics. We will check an INR today and doubtless lower her Coumadin dose. We are resuming Herceptin today and of course every 3 weeks indefinitely. She is already scheduled for repeat echocardiogram in November. She will see [place] again in December and then again in 6-9 weeks after that.  She has a good understanding of the overall plan. She knows to  call for any problems that may develop before the next visit and especially if any fever or bleeding develops.  It is very gratifying that she is now 11 years out from her presentation with metastatic disease to the liver, with no evidence of active breast cancer.  Chauncey Cruel, MD  06/24/2014  2:39 PM

## 2014-06-24 NOTE — Telephone Encounter (Signed)
s.w. pt and advised on added appt.Marland KitchenMarland Kitchenpt will get appts in tx room

## 2014-06-24 NOTE — Progress Notes (Addendum)
INR slightly above goal today. Pt recently hospitalized for probable cellulitis. INR on admission on 06/16/14 = 2.08. INR remained therapeutic during hospital stay on home dose of coumadin. Pt received Vanc 10/21 -10/23. Pt discharged on Keflex and Doxycyline x 13 days. Today is day #6/13. No missed or extra coumadin doses. Pt has bruising on left arm from IV sticks during hospital stay. No bleeding noted. No s/s of clotting noted. No real changes in diet.  Pt is trying to reduce her sodium intake. She will likely be eating more greens in about a month as those in her garden will be ready.  Pt has about 1 week left on antibiotics. Doxycycline and Keflex may potentiate the effects of coumadin. Hold coumadin today and on 06/25/14. On 06/26/14, continue taking 7.5mg  daily except 5mg  on Fridays.  Recheck INR in 2 weeks on 07/09/14 with lab at 9:30am and coumadin clinic at 9:45am.  Pt is scheduled for ECHO in [place] at 11am that day. She is trying to reduce her trips from [place] to [place].

## 2014-07-05 ENCOUNTER — Other Ambulatory Visit: Payer: Self-pay | Admitting: Emergency Medicine

## 2014-07-05 ENCOUNTER — Telehealth: Payer: Self-pay | Admitting: Oncology

## 2014-07-05 MED ORDER — ALPRAZOLAM 1 MG PO TABS: 1.0000 mg | ORAL_TABLET | Freq: Three times a day (TID) | ORAL | Status: DC | PRN

## 2014-07-05 MED ORDER — TEMAZEPAM 30 MG PO CAPS: 30.0000 mg | ORAL_CAPSULE | Freq: Every evening | ORAL | Status: DC | PRN

## 2014-07-05 NOTE — Telephone Encounter (Signed)
per Brandy to sch CC-pt aware °

## 2014-07-06 ENCOUNTER — Telehealth: Payer: Self-pay | Admitting: Nurse Practitioner

## 2014-07-06 ENCOUNTER — Telehealth: Payer: Self-pay | Admitting: *Deleted

## 2014-07-06 NOTE — Telephone Encounter (Signed)
I have moved 12/10 to 12/11

## 2014-07-06 NOTE — Telephone Encounter (Signed)
, °

## 2014-07-09 ENCOUNTER — Other Ambulatory Visit (HOSPITAL_COMMUNITY): Payer: Commercial Managed Care - HMO

## 2014-07-09 ENCOUNTER — Encounter (HOSPITAL_COMMUNITY): Payer: Commercial Managed Care - HMO

## 2014-07-09 ENCOUNTER — Other Ambulatory Visit (HOSPITAL_BASED_OUTPATIENT_CLINIC_OR_DEPARTMENT_OTHER): Payer: Commercial Managed Care - HMO

## 2014-07-09 ENCOUNTER — Ambulatory Visit (HOSPITAL_BASED_OUTPATIENT_CLINIC_OR_DEPARTMENT_OTHER): Payer: Commercial Managed Care - HMO | Admitting: Pharmacist

## 2014-07-09 DIAGNOSIS — C50819 Malignant neoplasm of overlapping sites of unspecified female breast: Secondary | ICD-10-CM

## 2014-07-09 DIAGNOSIS — I871 Compression of vein: Secondary | ICD-10-CM

## 2014-07-09 DIAGNOSIS — C50919 Malignant neoplasm of unspecified site of unspecified female breast: Secondary | ICD-10-CM

## 2014-07-09 LAB — CBC WITH DIFFERENTIAL/PLATELET
BASO%: 0.4 % (ref 0.0–2.0)
Basophils Absolute: 0 10*3/uL (ref 0.0–0.1)
EOS%: 2.9 % (ref 0.0–7.0)
Eosinophils Absolute: 0.2 10*3/uL (ref 0.0–0.5)
HEMATOCRIT: 34.1 % — AB (ref 34.8–46.6)
HGB: 11.7 g/dL (ref 11.6–15.9)
LYMPH%: 45.9 % (ref 14.0–49.7)
MCH: 27.1 pg (ref 25.1–34.0)
MCHC: 34.3 g/dL (ref 31.5–36.0)
MCV: 79.1 fL — AB (ref 79.5–101.0)
MONO#: 0.6 10*3/uL (ref 0.1–0.9)
MONO%: 7.7 % (ref 0.0–14.0)
NEUT#: 3.1 10*3/uL (ref 1.5–6.5)
NEUT%: 43.1 % (ref 38.4–76.8)
PLATELETS: 235 10*3/uL (ref 145–400)
RBC: 4.31 10*6/uL (ref 3.70–5.45)
RDW: 15.7 % — ABNORMAL HIGH (ref 11.2–14.5)
WBC: 7.3 10*3/uL (ref 3.9–10.3)
lymph#: 3.3 10*3/uL (ref 0.9–3.3)

## 2014-07-09 LAB — POCT INR: INR: 1.6

## 2014-07-09 LAB — PROTIME-INR
INR: 1.6 — ABNORMAL LOW (ref 2.00–3.50)
Protime: 19.2 Seconds — ABNORMAL HIGH (ref 10.6–13.4)

## 2014-07-09 NOTE — Patient Instructions (Signed)
INR below goal Take 7.5 mg Tonight only then continue taking 7.5mg  daily except 5mg  on Fridays.  Recheck INR in 2 weeks on 07/15/14 with lab at 9:30am and we will see you in infusion

## 2014-07-09 NOTE — Progress Notes (Signed)
INR below goal Pt is doing well today and is feeling better Ms. [name] completed course of keflex and doxycyline on Monday 07/05/14 Her appetite is improving No missed or extra doses Pt took coumadin as instructed No unusual bleeding or bruising No other changes Plan: Take 7.5 mg Tonight only  then continue taking 7.5mg  daily except 5mg  on Fridays.  Recheck INR in 2 weeks on 07/15/14 with lab at 9:30am and we will see you in infusion

## 2014-07-13 ENCOUNTER — Telehealth: Payer: Self-pay | Admitting: Oncology

## 2014-07-13 NOTE — Telephone Encounter (Signed)
per Chris to sch pt CC-pt aware °

## 2014-07-15 ENCOUNTER — Other Ambulatory Visit (HOSPITAL_BASED_OUTPATIENT_CLINIC_OR_DEPARTMENT_OTHER): Payer: Commercial Managed Care - HMO

## 2014-07-15 ENCOUNTER — Ambulatory Visit (HOSPITAL_BASED_OUTPATIENT_CLINIC_OR_DEPARTMENT_OTHER): Payer: Self-pay | Admitting: Pharmacist

## 2014-07-15 ENCOUNTER — Ambulatory Visit: Payer: Commercial Managed Care - HMO

## 2014-07-15 ENCOUNTER — Ambulatory Visit (HOSPITAL_BASED_OUTPATIENT_CLINIC_OR_DEPARTMENT_OTHER): Payer: Commercial Managed Care - HMO

## 2014-07-15 VITALS — BP 131/72 | HR 75 | Temp 99.1°F | Resp 18

## 2014-07-15 DIAGNOSIS — C50919 Malignant neoplasm of unspecified site of unspecified female breast: Secondary | ICD-10-CM

## 2014-07-15 DIAGNOSIS — C50911 Malignant neoplasm of unspecified site of right female breast: Secondary | ICD-10-CM

## 2014-07-15 DIAGNOSIS — C787 Secondary malignant neoplasm of liver and intrahepatic bile duct: Secondary | ICD-10-CM

## 2014-07-15 DIAGNOSIS — I871 Compression of vein: Secondary | ICD-10-CM

## 2014-07-15 DIAGNOSIS — C50819 Malignant neoplasm of overlapping sites of unspecified female breast: Secondary | ICD-10-CM

## 2014-07-15 DIAGNOSIS — Z95828 Presence of other vascular implants and grafts: Secondary | ICD-10-CM

## 2014-07-15 DIAGNOSIS — Z5112 Encounter for antineoplastic immunotherapy: Secondary | ICD-10-CM

## 2014-07-15 LAB — COMPREHENSIVE METABOLIC PANEL (CC13)
ALK PHOS: 94 U/L (ref 40–150)
ALT: 10 U/L (ref 0–55)
AST: 12 U/L (ref 5–34)
Albumin: 3.3 g/dL — ABNORMAL LOW (ref 3.5–5.0)
Anion Gap: 7 mEq/L (ref 3–11)
BUN: 18.1 mg/dL (ref 7.0–26.0)
CO2: 27 mEq/L (ref 22–29)
CREATININE: 1 mg/dL (ref 0.6–1.1)
Calcium: 9.5 mg/dL (ref 8.4–10.4)
Chloride: 105 mEq/L (ref 98–109)
Glucose: 111 mg/dl (ref 70–140)
Potassium: 4 mEq/L (ref 3.5–5.1)
Sodium: 139 mEq/L (ref 136–145)
Total Bilirubin: 0.37 mg/dL (ref 0.20–1.20)
Total Protein: 7.9 g/dL (ref 6.4–8.3)

## 2014-07-15 LAB — CBC WITH DIFFERENTIAL/PLATELET
BASO%: 0.2 % (ref 0.0–2.0)
Basophils Absolute: 0 10*3/uL (ref 0.0–0.1)
EOS%: 2.7 % (ref 0.0–7.0)
Eosinophils Absolute: 0.2 10*3/uL (ref 0.0–0.5)
HEMATOCRIT: 35.3 % (ref 34.8–46.6)
HEMOGLOBIN: 12.1 g/dL (ref 11.6–15.9)
LYMPH#: 3 10*3/uL (ref 0.9–3.3)
LYMPH%: 34.6 % (ref 14.0–49.7)
MCH: 27.1 pg (ref 25.1–34.0)
MCHC: 34.3 g/dL (ref 31.5–36.0)
MCV: 79.1 fL — ABNORMAL LOW (ref 79.5–101.0)
MONO#: 0.5 10*3/uL (ref 0.1–0.9)
MONO%: 5.6 % (ref 0.0–14.0)
NEUT#: 4.9 10*3/uL (ref 1.5–6.5)
NEUT%: 56.9 % (ref 38.4–76.8)
Platelets: 225 10*3/uL (ref 145–400)
RBC: 4.46 10*6/uL (ref 3.70–5.45)
RDW: 15.6 % — ABNORMAL HIGH (ref 11.2–14.5)
WBC: 8.6 10*3/uL (ref 3.9–10.3)
nRBC: 0 % (ref 0–0)

## 2014-07-15 LAB — POCT INR: INR: 3.3

## 2014-07-15 LAB — PROTIME-INR
INR: 3.3 (ref 2.00–3.50)
Protime: 39.6 Seconds — ABNORMAL HIGH (ref 10.6–13.4)

## 2014-07-15 MED ORDER — DIPHENHYDRAMINE HCL 25 MG PO CAPS
50.0000 mg | ORAL_CAPSULE | Freq: Once | ORAL | Status: AC
  Administered 2014-07-15: 50 mg via ORAL

## 2014-07-15 MED ORDER — DIPHENHYDRAMINE HCL 25 MG PO CAPS
ORAL_CAPSULE | ORAL | Status: AC
Start: 2014-07-15 — End: 2014-07-15
  Filled 2014-07-15: qty 2

## 2014-07-15 MED ORDER — LORAZEPAM 2 MG/ML IJ SOLN
1.0000 mg | Freq: Once | INTRAMUSCULAR | Status: AC | PRN
  Administered 2014-07-15: 1 mg via INTRAVENOUS

## 2014-07-15 MED ORDER — LORAZEPAM 2 MG/ML IJ SOLN
INTRAMUSCULAR | Status: AC
  Filled 2014-07-15: qty 1

## 2014-07-15 MED ORDER — SODIUM CHLORIDE 0.9 % IJ SOLN
10.0000 mL | INTRAMUSCULAR | Status: DC | PRN
  Administered 2014-07-15: 10 mL
  Filled 2014-07-15: qty 10

## 2014-07-15 MED ORDER — ACETAMINOPHEN 325 MG PO TABS
ORAL_TABLET | ORAL | Status: AC
  Filled 2014-07-15: qty 2

## 2014-07-15 MED ORDER — HEPARIN SOD (PORK) LOCK FLUSH 100 UNIT/ML IV SOLN
500.0000 [IU] | Freq: Once | INTRAVENOUS | Status: AC
  Administered 2014-07-15: 500 [IU] via INTRAVENOUS
  Filled 2014-07-15: qty 5

## 2014-07-15 MED ORDER — TRASTUZUMAB CHEMO INJECTION 440 MG
6.0000 mg/kg | Freq: Once | INTRAVENOUS | Status: AC
  Administered 2014-07-15: 756 mg via INTRAVENOUS
  Filled 2014-07-15: qty 36

## 2014-07-15 MED ORDER — SODIUM CHLORIDE 0.9 % IV SOLN
Freq: Once | INTRAVENOUS | Status: AC
  Administered 2014-07-15: 11:00:00 via INTRAVENOUS

## 2014-07-15 MED ORDER — SODIUM CHLORIDE 0.9 % IJ SOLN
10.0000 mL | INTRAMUSCULAR | Status: DC | PRN
  Administered 2014-07-15: 10 mL via INTRAVENOUS
  Filled 2014-07-15: qty 10

## 2014-07-15 MED ORDER — ACETAMINOPHEN 325 MG PO TABS
650.0000 mg | ORAL_TABLET | Freq: Once | ORAL | Status: AC
  Administered 2014-07-15: 650 mg via ORAL

## 2014-07-15 MED ORDER — HEPARIN SOD (PORK) LOCK FLUSH 100 UNIT/ML IV SOLN
500.0000 [IU] | Freq: Once | INTRAVENOUS | Status: AC | PRN
  Administered 2014-07-15: 500 [IU]
  Filled 2014-07-15: qty 5

## 2014-07-15 NOTE — Patient Instructions (Signed)

## 2014-07-15 NOTE — Progress Notes (Signed)
INR=3.3.  H/H = 12.1/35.3.  No bleeding or unusual bruising.  [name] states that she if finishing up a round of antibiotics (cephalexin/doxycyline are listed on med list for cellulitis) but no other med changes.  Will continue coumadin 7.5mg  daily but 5mg  on Fridays.  [name] is receiving Herceptin today.  Will continue current dose of coumadin and check PT/INR in 3 weeks with next planned herceptin.

## 2014-07-15 NOTE — Patient Instructions (Signed)

## 2014-07-16 ENCOUNTER — Ambulatory Visit (HOSPITAL_BASED_OUTPATIENT_CLINIC_OR_DEPARTMENT_OTHER)
Admission: RE | Admit: 2014-07-16 | Discharge: 2014-07-16 | Disposition: A | Discharge status: Home / Self Care | Payer: Commercial Managed Care - HMO | Source: Ambulatory Visit | Attending: Cardiology | Admitting: Cardiology

## 2014-07-16 ENCOUNTER — Other Ambulatory Visit (HOSPITAL_COMMUNITY): Payer: Self-pay | Admitting: Cardiology

## 2014-07-16 ENCOUNTER — Ambulatory Visit (HOSPITAL_COMMUNITY)
Admission: RE | Admit: 2014-07-16 | Discharge: 2014-07-16 | Disposition: A | Discharge status: Home / Self Care | Payer: Commercial Managed Care - HMO | Source: Ambulatory Visit | Attending: Oncology | Admitting: Oncology

## 2014-07-16 ENCOUNTER — Other Ambulatory Visit: Payer: Self-pay | Admitting: Oncology

## 2014-07-16 DIAGNOSIS — R0683 Snoring: Secondary | ICD-10-CM | POA: Diagnosis not present

## 2014-07-16 DIAGNOSIS — C7800 Secondary malignant neoplasm of unspecified lung: Secondary | ICD-10-CM | POA: Diagnosis not present

## 2014-07-16 DIAGNOSIS — C787 Secondary malignant neoplasm of liver and intrahepatic bile duct: Secondary | ICD-10-CM | POA: Diagnosis not present

## 2014-07-16 DIAGNOSIS — C799 Secondary malignant neoplasm of unspecified site: Secondary | ICD-10-CM

## 2014-07-16 DIAGNOSIS — Z7901 Long term (current) use of anticoagulants: Secondary | ICD-10-CM | POA: Insufficient documentation

## 2014-07-16 DIAGNOSIS — I10 Essential (primary) hypertension: Secondary | ICD-10-CM | POA: Insufficient documentation

## 2014-07-16 DIAGNOSIS — Z9013 Acquired absence of bilateral breasts and nipples: Secondary | ICD-10-CM | POA: Insufficient documentation

## 2014-07-16 DIAGNOSIS — C50919 Malignant neoplasm of unspecified site of unspecified female breast: Secondary | ICD-10-CM

## 2014-07-16 DIAGNOSIS — Z9221 Personal history of antineoplastic chemotherapy: Secondary | ICD-10-CM | POA: Insufficient documentation

## 2014-07-16 DIAGNOSIS — Z79899 Other long term (current) drug therapy: Secondary | ICD-10-CM | POA: Insufficient documentation

## 2014-07-16 DIAGNOSIS — I5022 Chronic systolic (congestive) heart failure: Secondary | ICD-10-CM

## 2014-07-16 DIAGNOSIS — C50912 Malignant neoplasm of unspecified site of left female breast: Secondary | ICD-10-CM | POA: Insufficient documentation

## 2014-07-16 DIAGNOSIS — Z923 Personal history of irradiation: Secondary | ICD-10-CM | POA: Insufficient documentation

## 2014-07-16 DIAGNOSIS — I059 Rheumatic mitral valve disease, unspecified: Secondary | ICD-10-CM

## 2014-07-16 MED ORDER — CARVEDILOL 3.125 MG PO TABS: 3.1250 mg | ORAL_TABLET | Freq: Two times a day (BID) | ORAL | Status: DC

## 2014-07-16 NOTE — Progress Notes (Signed)
Echocardiogram 2D Echocardiogram has been performed.  [name] 07/16/2014, 11:04 AM

## 2014-07-16 NOTE — Patient Instructions (Signed)
Start Carvedilol 3.125 mg Twice daily   Your physician recommends that you schedule a follow-up appointment in: 6 weeks with echocardiogram

## 2014-07-17 NOTE — Progress Notes (Signed)
Patient ID: CHEVY VIRGO, female   DOB: [birth year], 63 y.o.   MRN: [id number] HPI: Mrs Tague is a 63 year old with a history of metastatic HER-2 positive breast carcinoma originally diagnosed September 2004. Started in R breast. Underwent neo-adjuvant chemo. During this time developed L breast CA. Underwent bilateral mastectomy. Lymph nodes +. 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   She received a total of 6 cycles of Taxotere/Carbo/Herceptin, completed in April 2005, after which she began single agent Herceptin, given every 3 weeks. Plan to continue on Herceptin indefinitely.   She returns for follow up. Since last appointment, she was admitted for treatment of cellulitis.  She continues on Herceptin every 3 weeks. Denies SOB/PND/Orthopnea. She takes Lasix as needed, has not taken any this week. Weight is stable.    Labs (11/15): K 4, creatinine 1.0    04/23/12 ECHO EF 60-65% Lateral s' 8.9 cm/s  07/30/12 ECHO EF 60-65% Lateral s' 8.3 cm/s  09/11/12 ECHO EF 60-65% Lateral s' 10.3 cm/s  12/22/12 ECHO 55-60% Lateral S' 9.8 RV mildly dilated  07/01/13 ECHO 55-60%, lateral s' 9.79, mild RV dilation, grade II DD 3/15 ECHO 55%, mild MR, lateral s' 9.6, GLS -19.2% 03/02/14 ECHO EF 55-60% Lateral S' 9.4 GLS - 21.9  11/15 ECHO EF 60-65%, lateral S' 6.8, GLS -17.2%, mild RV dilation with normal systolic function, mild MR.   ROS: All systems negative except as listed in HPI, PMH and Problem List.  Past Medical History  Diagnosis Date  . Breast cancer     mets to liver and lung  . Hypertension   . SVC syndrome   . History of chemotherapy Feb. 2006    taxotere/herceptin/carboplatin  . Radiation 07/31/2006    left upper chest  . Radiation 06/17/2006-06/27/2006    6480 cGy bilat. chest wall  . Neuropathy   . Thrombosis   . Breast cancer metastasized to multiple sites 02/26/2013    Current Outpatient Prescriptions  Medication Sig Dispense Refill  .  acetaminophen (TYLENOL) 500 MG tablet Take 1,000 mg by mouth every 6 (six) hours as needed for mild pain or fever.     Marland Kitchen albuterol (PROVENTIL HFA;VENTOLIN HFA) 108 (90 BASE) MCG/ACT inhaler Inhale 2 puffs into the lungs every 6 (six) hours as needed for wheezing. 1 Inhaler 3  . ALPRAZolam (XANAX) 1 MG tablet Take 1 tablet (1 mg total) by mouth 3 (three) times daily as needed for anxiety. 90 tablet 1  . amLODipine (NORVASC) 10 MG tablet Take 10 mg by mouth every morning.    . baclofen (LIORESAL) 10 MG tablet Take 10 mg by mouth 3 (three) times daily as needed for muscle spasms.     . diclofenac sodium (VOLTAREN) 1 % GEL Apply 2 g topically daily as needed (for pain). Apply to knees and shoulders 100 g 6  . furosemide (LASIX) 80 MG tablet Take 80 mg by mouth daily as needed for fluid.     Marland Kitchen gabapentin (NEURONTIN) 300 MG capsule Take 600 mg by mouth 2 (two) times daily.     Marland Kitchen losartan (COZAAR) 100 MG tablet Take 100 mg by mouth every morning.    . potassium chloride (K-DUR,KLOR-CON) 10 MEQ tablet Take 10 mEq by mouth 2 (two) times daily.    Marland Kitchen PRESCRIPTION MEDICATION Chemo - [facility]    . prochlorperazine (COMPAZINE) 10 MG tablet Take 1 tablet (10 mg total) by mouth every 6 (six) hours as needed (for nausea). 30 tablet 3  .  spironolactone (ALDACTONE) 25 MG tablet Take 0.5 tablets (12.5 mg total) by mouth daily. 30 tablet 3  . temazepam (RESTORIL) 30 MG capsule Take 1 capsule (30 mg total) by mouth at bedtime as needed for sleep. 30 capsule 1  . traMADol (ULTRAM) 50 MG tablet Take 2 tablets (100 mg total) by mouth every 6 (six) hours as needed. 120 tablet 1  . warfarin (COUMADIN) 5 MG tablet Take 5-7.5 mg by mouth See admin instructions. Takes 7.59m everyday except 561mon Friday's    . zolpidem (AMBIEN) 10 MG tablet     . carvedilol (COREG) 3.125 MG tablet TAKE 1 TABLET BY MOUTH TWICE DAILY 180 tablet 3  . warfarin (COUMADIN) 5 MG tablet TAKE 1 TABLET BY MOUTH DAILY AS DIRECTED 90 tablet 0   No current  facility-administered medications for this encounter.   Facility-Administered Medications Ordered in Other Encounters  Medication Dose Route Frequency Provider Last Rate Last Dose  . sodium chloride 0.9 % injection 10 mL  10 mL Intravenous PRN GuChauncey CruelMD   10 mL at 12/17/13 1103    Filed Vitals:   07/16/14 1138  BP: 122/70  Pulse: 82  Weight: 290 lb 4 oz (131.657 kg)  SpO2: 97%    PHYSICAL EXAM: General: Well appearing. No respiratory difficulty Husband present  HEENT: normal  Neck: Thick. no JVD difficult to assess d/t body habitus but does not appear elevated; Carotids 2+ bilat; no bruits. No lymphadenopathy or thryomegaly appreciated.  Cor: PMI nondisplaced. Regular rate & rhythm. No rubs, gallops or murmurs. S/P bilateral mastectomies  Lungs: clear  Abdomen: obese. soft, nontender, nondistended. Good bowel sounds.  Extremities: no cyanosis, clubbing, rash.  No edema.  Neuro: alert & oriented x 3, cranial nerves grossly intact. moves all 4 extremities w/o difficulty. Affect pleasant.  ASSESSMENT & PLAN: 1) L Breast Cancer s/p bilateral mastectomies:  Symptomatically stable.  I reviewed today's echo.  Images were not as clear as in the past.  EF was unchanged.  However, lateral S' and strain were lower than on the last study.  Given difference in study quality, I am not convinced that there has been a definite change.  However, I think that she will need to be monitored more closely.  Will get her next echo in 6 wks instead of 3 months but will continue Herceptin for now.  I will start her on Coreg 3.125 mg bid and she will continue losartan.  2) Suspected OSA: Patient has thick neck, mild RV dilation, snores at night.  Prefers to hold on sleep study.   3) HTN: Stable .   DaLoralie Champagne1/21/2015

## 2014-08-05 ENCOUNTER — Ambulatory Visit: Payer: Commercial Managed Care - HMO

## 2014-08-05 ENCOUNTER — Telehealth: Payer: Self-pay | Admitting: Oncology

## 2014-08-05 ENCOUNTER — Other Ambulatory Visit: Payer: Commercial Managed Care - HMO

## 2014-08-05 ENCOUNTER — Ambulatory Visit: Payer: Commercial Managed Care - HMO | Admitting: Adult Health

## 2014-08-05 NOTE — Telephone Encounter (Signed)
Lvm advising appt chg from [facility] to [facility] on 1/21 (md on pal). Time chg from 9am to 8.45am. Asked pt in vm to collect a new calendar at next visit on 12/11.

## 2014-08-05 NOTE — Telephone Encounter (Signed)
per Lisa to sch pt CC-pt aware °

## 2014-08-06 ENCOUNTER — Encounter: Payer: Self-pay | Admitting: Nurse Practitioner

## 2014-08-06 ENCOUNTER — Ambulatory Visit (HOSPITAL_BASED_OUTPATIENT_CLINIC_OR_DEPARTMENT_OTHER): Payer: Commercial Managed Care - HMO | Admitting: Nurse Practitioner

## 2014-08-06 ENCOUNTER — Other Ambulatory Visit: Payer: Self-pay | Admitting: *Deleted

## 2014-08-06 ENCOUNTER — Ambulatory Visit (HOSPITAL_BASED_OUTPATIENT_CLINIC_OR_DEPARTMENT_OTHER): Payer: Commercial Managed Care - HMO

## 2014-08-06 ENCOUNTER — Ambulatory Visit (HOSPITAL_BASED_OUTPATIENT_CLINIC_OR_DEPARTMENT_OTHER): Payer: Self-pay | Admitting: Pharmacist

## 2014-08-06 ENCOUNTER — Other Ambulatory Visit (HOSPITAL_BASED_OUTPATIENT_CLINIC_OR_DEPARTMENT_OTHER): Payer: Commercial Managed Care - HMO

## 2014-08-06 VITALS — BP 146/89 | HR 66 | Temp 98.1°F | Resp 16 | Ht 63.0 in | Wt 298.7 lb

## 2014-08-06 DIAGNOSIS — Z5112 Encounter for antineoplastic immunotherapy: Secondary | ICD-10-CM

## 2014-08-06 DIAGNOSIS — C7951 Secondary malignant neoplasm of bone: Secondary | ICD-10-CM

## 2014-08-06 DIAGNOSIS — C50919 Malignant neoplasm of unspecified site of unspecified female breast: Secondary | ICD-10-CM

## 2014-08-06 DIAGNOSIS — C787 Secondary malignant neoplasm of liver and intrahepatic bile duct: Secondary | ICD-10-CM

## 2014-08-06 DIAGNOSIS — C50811 Malignant neoplasm of overlapping sites of right female breast: Secondary | ICD-10-CM

## 2014-08-06 DIAGNOSIS — I871 Compression of vein: Secondary | ICD-10-CM

## 2014-08-06 DIAGNOSIS — C50912 Malignant neoplasm of unspecified site of left female breast: Secondary | ICD-10-CM

## 2014-08-06 LAB — CBC WITH DIFFERENTIAL/PLATELET
BASO%: 1.2 % (ref 0.0–2.0)
BASOS ABS: 0.1 10*3/uL (ref 0.0–0.1)
EOS%: 2.7 % (ref 0.0–7.0)
Eosinophils Absolute: 0.3 10*3/uL (ref 0.0–0.5)
HEMATOCRIT: 34.3 % — AB (ref 34.8–46.6)
HEMOGLOBIN: 11.4 g/dL — AB (ref 11.6–15.9)
LYMPH%: 33.3 % (ref 14.0–49.7)
MCH: 26.8 pg (ref 25.1–34.0)
MCHC: 33.2 g/dL (ref 31.5–36.0)
MCV: 80.7 fL (ref 79.5–101.0)
MONO#: 0.8 10*3/uL (ref 0.1–0.9)
MONO%: 7.6 % (ref 0.0–14.0)
NEUT#: 5.6 10*3/uL (ref 1.5–6.5)
NEUT%: 55.2 % (ref 38.4–76.8)
Platelets: 240 10*3/uL (ref 145–400)
RBC: 4.24 10*6/uL (ref 3.70–5.45)
RDW: 15.8 % — ABNORMAL HIGH (ref 11.2–14.5)
WBC: 10.2 10*3/uL (ref 3.9–10.3)
lymph#: 3.4 10*3/uL — ABNORMAL HIGH (ref 0.9–3.3)

## 2014-08-06 LAB — PROTIME-INR
INR: 2.2 (ref 2.00–3.50)
Protime: 26.4 Seconds — ABNORMAL HIGH (ref 10.6–13.4)

## 2014-08-06 LAB — COMPREHENSIVE METABOLIC PANEL (CC13)
ALT: 13 U/L (ref 0–55)
ANION GAP: 10 meq/L (ref 3–11)
AST: 14 U/L (ref 5–34)
Albumin: 3.3 g/dL — ABNORMAL LOW (ref 3.5–5.0)
Alkaline Phosphatase: 96 U/L (ref 40–150)
BUN: 17.8 mg/dL (ref 7.0–26.0)
CHLORIDE: 105 meq/L (ref 98–109)
CO2: 26 mEq/L (ref 22–29)
CREATININE: 0.9 mg/dL (ref 0.6–1.1)
Calcium: 9.5 mg/dL (ref 8.4–10.4)
EGFR: 81 mL/min/{1.73_m2} — ABNORMAL LOW (ref >90)
Glucose: 95 mg/dl (ref 70–140)
Potassium: 4.1 mEq/L (ref 3.5–5.1)
Sodium: 141 mEq/L (ref 136–145)
Total Bilirubin: 0.96 mg/dL (ref 0.20–1.20)
Total Protein: 7.8 g/dL (ref 6.4–8.3)

## 2014-08-06 LAB — POCT INR: INR: 2.2

## 2014-08-06 MED ORDER — ACETAMINOPHEN 325 MG PO TABS
ORAL_TABLET | ORAL | Status: AC
  Filled 2014-08-06: qty 2

## 2014-08-06 MED ORDER — DIPHENHYDRAMINE HCL 25 MG PO CAPS
50.0000 mg | ORAL_CAPSULE | Freq: Once | ORAL | Status: AC
  Administered 2014-08-06: 50 mg via ORAL

## 2014-08-06 MED ORDER — TRAMADOL HCL 50 MG PO TABS: 50.0000 mg | ORAL_TABLET | Freq: Four times a day (QID) | ORAL | Status: DC | PRN

## 2014-08-06 MED ORDER — LORAZEPAM 2 MG/ML IJ SOLN
1.0000 mg | Freq: Once | INTRAMUSCULAR | Status: AC | PRN
  Administered 2014-08-06: 1 mg via INTRAVENOUS

## 2014-08-06 MED ORDER — LORAZEPAM 2 MG/ML IJ SOLN
INTRAMUSCULAR | Status: AC
  Filled 2014-08-06: qty 1

## 2014-08-06 MED ORDER — ACETAMINOPHEN 325 MG PO TABS
650.0000 mg | ORAL_TABLET | Freq: Once | ORAL | Status: AC
  Administered 2014-08-06: 650 mg via ORAL

## 2014-08-06 MED ORDER — DIPHENHYDRAMINE HCL 25 MG PO CAPS
ORAL_CAPSULE | ORAL | Status: AC
  Filled 2014-08-06: qty 2

## 2014-08-06 MED ORDER — SODIUM CHLORIDE 0.9 % IV SOLN
Freq: Once | INTRAVENOUS | Status: AC
  Administered 2014-08-06: 11:00:00 via INTRAVENOUS

## 2014-08-06 MED ORDER — TRASTUZUMAB CHEMO INJECTION 440 MG
6.0000 mg/kg | Freq: Once | INTRAVENOUS | Status: AC
  Administered 2014-08-06: 756 mg via INTRAVENOUS
  Filled 2014-08-06: qty 36

## 2014-08-06 MED ORDER — SODIUM CHLORIDE 0.9 % IJ SOLN
10.0000 mL | INTRAMUSCULAR | Status: DC | PRN
  Administered 2014-08-06: 10 mL
  Filled 2014-08-06: qty 10

## 2014-08-06 MED ORDER — HEPARIN SOD (PORK) LOCK FLUSH 100 UNIT/ML IV SOLN
500.0000 [IU] | Freq: Once | INTRAVENOUS | Status: AC | PRN
  Administered 2014-08-06: 500 [IU]
  Filled 2014-08-06: qty 5

## 2014-08-06 NOTE — Patient Instructions (Signed)
[facility] Discharge Instructions for Patients Receiving Chemotherapy  Today you received the following chemotherapy agents Herceptin  To help prevent nausea and vomiting after your treatment, we encourage you to take your nausea medication     If you develop nausea and vomiting that is not controlled by your nausea medication, call the clinic.   BELOW ARE SYMPTOMS THAT SHOULD BE REPORTED IMMEDIATELY:  *FEVER GREATER THAN 100.5 F  *CHILLS WITH OR WITHOUT FEVER  NAUSEA AND VOMITING THAT IS NOT CONTROLLED WITH YOUR NAUSEA MEDICATION  *UNUSUAL SHORTNESS OF BREATH  *UNUSUAL BRUISING OR BLEEDING  TENDERNESS IN MOUTH AND THROAT WITH OR WITHOUT PRESENCE OF ULCERS  *URINARY PROBLEMS  *BOWEL PROBLEMS  UNUSUAL RASH Items with * indicate a potential emergency and should be followed up as soon as possible.  Feel free to call the clinic you have any questions or concerns. The clinic phone number is [phone].    

## 2014-08-06 NOTE — Progress Notes (Signed)
INR = 2.2 on Coumadin 7.5 mg daily except 5 mg on Fridays. Pt started on Coreg last week.  No interaction w/ Coumadin. Off abx. INR stable.  Cont same Coumadin dose w/o change. Repeat protime on 09/16/14 & we will see her in infusion. Kennith Center, Pharm.D., CPP 08/06/2014@10 :53 AM

## 2014-08-06 NOTE — Progress Notes (Signed)
[facility]  [phone] [phone]) [phone]  OFFICE PROGRESS NOTE  ID: GLYNDA SOLIDAY   DOB: 1951-07-14  MR#: [id number]  PQA#:[id number]  PCP: Ernestene Kiel, MD GYN:  SU:  OTHER MD: Pierre Bali, Johnnette Gourd, Wendee Copp  CC: Metastatic HER-2 positive breast cancer.  CURRENT TREATMENT: Trastuzumab  BREAST CANCER HISTORY:   From the earlier summary:  Laurena Valko is 63 years old [name] female.  This woman has been in good health all of her life.  She noted a swelling and discomfort in her right breast in June 2004.  She was seen in the Emergency Room in [place] and was treated for mastitis.  She was treated for a number of months with mastitis and the swelling did not get better. She was given hydrocodone and Cipro.  Finally, the swelling did get better and ultimately the nipple became retracted and she noticed some dimpling in her skin. She had a mammogram in July of 2004 in [place] with subsequent mammogram on May 27, 2003, by Dr. Isaiah Blakes.  Mammogram done on September 30 showed marked increased density in the left breast.  Biopsy was performed the same day.  It was noted at the 12 o'clock position, deep in the breast was a focal hypoechoic mass, at least 3.5 cm in diameter.  Biopsy did in fact show invasive in situ mammary carcinoma. This was felt to be both at least intermediate, high grade.  No definite lymphovascular invasion was identified. ER and PR negative, Her2 testing positive. Grenda continues to have pain in her breast.  She continues to take hydrocodone a number of times a day.  She has been seen by Dr. Rosana Hoes, who felt that neoadjuvant chemotherapy would be required.    Initial staging studies showed evidence of liver and lung mets.   Patient also has evidence of bone lesions. Patient started neoadjuvant chemotherapy, Taxotere/Carbo/Herceptin in October 2004.   Patient had a CT scan in December 2004 which  demonstrated extensive clot in the SVC innominate vein, bilateral jugular vein and  She was started on anticoagulation therapy. She received a total of 6 cycles of Taxotere/Carbo/Herceptin, completed in April 2005."  Patient has been on  trastuzumab continued indefinitely; has also received lapatinib and capecitabine for variable intervals in 2007-2008. Most recent echo 12/01/2013 showed an ejection fraction of 55%. She is status post bilateral mastectomies with bilateral axillary lymph node dissection 12/07/2004, showing (a) on the right, a mypT1c ypN1 invasive ductal carcinoma, grade 3, estrogen and progesterone receptor negative, HER-2 positive, with an MIB-1 of 31% (b) on the left, ypT2 ypN1 invasive ductal carcinoma, grade 2, estrogen and progesterone receptor negative, HER-2 positive, with an MIB-1 of 35%. She is Status post radiation June through July of 2006, to the right chest wall, left chest wall, bilateral supraclavicular fossae, and bilateral axillary boosts; with additional radiation to the right and left chest walls and the central chest wall completed November of 2007. She is status post ixempra x9 completed August of 2009. 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 Patient has chronic pain, with negative PET scan 08/24/2013 (no evidence of active cancer). On Neurontin and Tramadol therapy.  Her subsequent history is as detailed below  INTERVAL HISTORY: Lakysha returns today for followup of her stage IV breast cancer accompanied by her husband, Abe People. She is due for trastuzumab. The interval history is generally unremarkable. There have been no changes to  her health history. Her BP is elevated today despite her taking her meds. She states she didn't get much sleep because of aggravating arthritic knee pain, plus she is stressed with some personal issues.   REVIEW OF SYSTEMS: Kiarrah denies fevers, chills,  nausea, vomiting, or changes in bowel or bladder habits. She has a mild headache today, but denies dizziness , unexplained bleeding, or bruising. Her appetite is "too good" and she is staying well hydrated. She denies shortness of breath, chest pain, cough, palpitation, or fatigue. A detailed review of systems is otherwise negative.   PAST MEDICAL HISTORY: Past Medical History  Diagnosis Date  . Breast cancer     mets to liver and lung  . Hypertension   . SVC syndrome   . History of chemotherapy Feb. 2006    taxotere/herceptin/carboplatin  . Radiation 07/31/2006    left upper chest  . Radiation 06/17/2006-06/27/2006    6480 cGy bilat. chest wall  . Neuropathy   . Thrombosis   . Breast cancer metastasized to multiple sites 02/26/2013    PAST SURGICAL HISTORY: Past Surgical History  Procedure Laterality Date  . Tubal ligation  1986  . Cholecystectomy  1989  . Mastectomy Bilateral   . Ankle surgery    . Peripherally inserted central catheter insertion    . Back surgery      FAMILY HISTORY Family History  Problem Relation Age of Onset  . Heart failure Father   . Cancer Father     Prostate cancer  . Heart failure Brother   . Cancer Brother     Prostate cancer  . Diabetes Maternal Aunt    She had three brothers, one died of gunshot wound, one of complications of diabetes mellitus and one of myocardial infarction.  She has no sisters.  Mother died of complications of brain metastasis in 22.  Father has had a myocardial infarction in 1999.  No history of breast or ovarian cancer in the family.     GYNECOLOGIC HISTORY:   Menarche at age 54.  Gravida 3, para 3.  First live birth at age 77.  No history of breast feeding. No history of hormonal replacement therapy.   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 Her husband used to work as a Art gallery manager, but is now retired. She has three children, Monette who lives in  [place] and works as a Hydrographic surveyor, Financial risk analyst who lives in [place] and works as a Administrator, and Hastings who lives in [place] and also works as a Hydrographic surveyor. The patient has 12 grandchildren and 4 great-grandchildren. She attends a [employer]. Very involved with school kids.  ADVANCED DIRECTIVES:  Not in place  HEALTH MAINTENANCE: (Updated 06/12/2013) History  Substance Use Topics  . Smoking status: Never Smoker   . Smokeless tobacco: Never Used  . Alcohol Use: Yes     Comment: occasional     Colonoscopy: Never and "I don't want one"  PAP:  1987  Bone density:  Never  Lipid panel:  Not on file    Allergies  Allergen Reactions  . Penicillins Hives    PATIENT HAS TOLERATED CEPHALOSPORINGS  . Adhesive [Tape] Other (See Comments)    Tears skin     Current Outpatient Prescriptions  Medication Sig Dispense Refill  . acetaminophen (TYLENOL) 500 MG tablet Take 1,000 mg by mouth every 6 (six) hours as needed for mild pain  or fever.     Marland Kitchen albuterol (PROVENTIL HFA;VENTOLIN HFA) 108 (90 BASE) MCG/ACT inhaler Inhale 2 puffs into the lungs every 6 (six) hours as needed for wheezing. 1 Inhaler 3  . ALPRAZolam (XANAX) 1 MG tablet Take 1 tablet (1 mg total) by mouth 3 (three) times daily as needed for anxiety. 90 tablet 1  . amLODipine (NORVASC) 10 MG tablet Take 10 mg by mouth every morning.    . baclofen (LIORESAL) 10 MG tablet Take 10 mg by mouth 3 (three) times daily as needed for muscle spasms.     . carvedilol (COREG) 3.125 MG tablet TAKE 1 TABLET BY MOUTH TWICE DAILY 180 tablet 3  . cephALEXin (KEFLEX) 500 MG capsule   0  . diclofenac sodium (VOLTAREN) 1 % GEL Apply 2 g topically daily as needed (for pain). Apply to knees and shoulders 100 g 6  . doxycycline (VIBRA-TABS) 100 MG tablet   0  . furosemide (LASIX) 80 MG tablet Take 80 mg by mouth daily as needed for fluid.     Marland Kitchen gabapentin (NEURONTIN) 300 MG capsule Take 600 mg by mouth 2 (two) times daily.     Marland Kitchen losartan  (COZAAR) 100 MG tablet Take 100 mg by mouth every morning.    . potassium chloride (K-DUR,KLOR-CON) 10 MEQ tablet Take 10 mEq by mouth 2 (two) times daily.    . potassium chloride (MICRO-K) 10 MEQ CR capsule   1  . PRESCRIPTION MEDICATION Chemo - [facility]    . prochlorperazine (COMPAZINE) 10 MG tablet Take 1 tablet (10 mg total) by mouth every 6 (six) hours as needed (for nausea). 30 tablet 3  . spironolactone (ALDACTONE) 25 MG tablet Take 0.5 tablets (12.5 mg total) by mouth daily. 30 tablet 3  . temazepam (RESTORIL) 30 MG capsule Take 1 capsule (30 mg total) by mouth at bedtime as needed for sleep. 30 capsule 1  . traMADol (ULTRAM) 50 MG tablet Take 2 tablets (100 mg total) by mouth every 6 (six) hours as needed. 120 tablet 1  . warfarin (COUMADIN) 5 MG tablet Take 5-7.5 mg by mouth See admin instructions. Takes 7.70m everyday except 537mon Friday's    . warfarin (COUMADIN) 5 MG tablet TAKE 1 TABLET BY MOUTH DAILY AS DIRECTED 90 tablet 0  . zolpidem (AMBIEN) 10 MG tablet      No current facility-administered medications for this visit.   Facility-Administered Medications Ordered in Other Visits  Medication Dose Route Frequency Provider Last Rate Last Dose  . sodium chloride 0.9 % injection 10 mL  10 mL Intravenous PRN GuChauncey CruelMD   10 mL at 12/17/13 1103    OBJECTIVE:  Middle aged African American woman who appears stated age Fi53itals:   08/06/14 0930  BP: 153/91  Pulse: 66  Temp: 98.1 F (36.7 C)  Resp: 16     Body mass index is 52.93 kg/(m^2).    ECOG FS: 1 Filed Weights   08/06/14 0930  Weight: 298 lb 11.2 oz (135.489 kg)   Filed Weights   08/06/14 0930  Weight: 298 lb 11.2 oz (135.489 kg)   Skin: warm, dry  HEENT: sclerae anicteric, conjunctivae pink, oropharynx clear. No thrush or mucositis.  Lymph Nodes: No cervical or supraclavicular lymphadenopathy  Lungs: clear to auscultation bilaterally, no rales, wheezes, or rhonci  Heart: regular rate and rhythm   Abdomen: round, soft, non tender, positive bowel sounds  Musculoskeletal: No focal spinal tenderness, no peripheral edema  Neuro: non focal,  well oriented, positive affect  Breasts: deferred  LAB RESULTS: Lab Results  Component Value Date   WBC 10.2 08/06/2014   NEUTROABS 5.6 08/06/2014   HGB 11.4* 08/06/2014   HCT 34.3* 08/06/2014   MCV 80.7 08/06/2014   PLT 240 08/06/2014      Chemistry      Component Value Date/Time   NA 139 07/15/2014 0936   NA 138 06/19/2014 0500   K 4.0 07/15/2014 0936   K 3.5* 06/19/2014 0500   CL 102 06/19/2014 0500   CL 104 01/30/2013 0850   CO2 27 07/15/2014 0936   CO2 24 06/19/2014 0500   BUN 18.1 07/15/2014 0936   BUN 11 06/19/2014 0500   CREATININE 1.0 07/15/2014 0936   CREATININE 0.89 06/19/2014 0500      Component Value Date/Time   CALCIUM 9.5 07/15/2014 0936   CALCIUM 9.0 06/19/2014 0500   ALKPHOS 94 07/15/2014 0936   ALKPHOS 87 04/11/2012 0837   AST 12 07/15/2014 0936   AST 19 04/11/2012 0837   ALT 10 07/15/2014 0936   ALT 14 04/11/2012 0837   BILITOT 0.37 07/15/2014 0936   BILITOT 0.8 04/11/2012 0837       ASSESSMENT: 63 y.o.  [place], [place], woman  (1)  with a history of inflammatory right breast cancer metastatic at presentation September 2004 with involvement of liver and bone, HER-2 positive, estrogen and progesterone receptor negative  (2) treated with carboplatin, docetaxel and Herceptin x 6 completed April 2005  (3) trastuzumab continued indefinitely; has also received lapatinib and capecitabine for variable intervals in 2007-2008. Most recent echo 03/02/2014 showed an ejection fraction of 55-60%.  (4) status post bilateral mastectomies with bilateral axillary lymph node dissection 12/07/2004, showing  (a) on the right, a mypT1c ypN1 invasive ductal carcinoma, grade 3, estrogen and progesterone receptor negative, HER-2 positive, with an MIB-1 of 31%  (b) on the left, ypT2 ypN1 invasive ductal carcinoma,  grade 2, estrogen and progesterone receptor negative, HER-2 positive, with an MIB-1 of 35%.  (5)  Status post radiation June through July of 2006, to the right chest wall, left chest wall, bilateral supraclavicular fossae, and bilateral axillary boosts; with additional radiation to the right and left chest walls and the central chest wall completed November of 2007  (6) status post Ixempra x9 completed August of 2009.  (7) history of superior vena caval syndrome, on life long anticoagulation   (8)  History of chemotherapy-induced neuropathy.   (9)  chronic pain, with negative PET scan 08/24/2013 (no evidence of active cancer). On Neurontin and Tramadol  (10) right upper extremity cellulitis, no bacteremia; treated with cephalexin /doxycycline for 2 weeks, with resolution   PLAN: Luz is doing well as far as her metastatic breast cancer is concerned. She is now 11 years out from her presentation of mets to the liver, with no evidence of active breast cancer. The labs were reviewed in detail and were entirely stable. Her most recent echocardiogram on 07/16/14 showed a well preserved ejection fraction. We will proceed with trastuzumab.   Lavra will continue with trastuzuamb every 3 weeks indefinitely. Her next echocardiogram is due in February 2016. She will return for labs and an office visit in 6 weeks. She understands and agrees with this plan. She knows the goal of treatment in her case is control. She has been encouraged to call with any issues that might arise before her next visit here.   Marcelino Duster, NP  08/06/2014  9:36 AM

## 2014-08-13 ENCOUNTER — Telehealth: Payer: Self-pay | Admitting: Nurse Practitioner

## 2014-08-13 NOTE — Telephone Encounter (Signed)
No note

## 2014-08-21 ENCOUNTER — Other Ambulatory Visit: Payer: Self-pay | Admitting: Oncology

## 2014-08-21 DIAGNOSIS — C50919 Malignant neoplasm of unspecified site of unspecified female breast: Secondary | ICD-10-CM

## 2014-08-23 ENCOUNTER — Other Ambulatory Visit: Payer: Self-pay | Admitting: Oncology

## 2014-08-26 ENCOUNTER — Other Ambulatory Visit (HOSPITAL_BASED_OUTPATIENT_CLINIC_OR_DEPARTMENT_OTHER): Payer: Commercial Managed Care - HMO

## 2014-08-26 ENCOUNTER — Ambulatory Visit (HOSPITAL_BASED_OUTPATIENT_CLINIC_OR_DEPARTMENT_OTHER): Payer: Commercial Managed Care - HMO

## 2014-08-26 ENCOUNTER — Ambulatory Visit: Payer: Commercial Managed Care - HMO

## 2014-08-26 VITALS — BP 138/87 | HR 69 | Temp 98.6°F | Resp 20

## 2014-08-26 DIAGNOSIS — C50811 Malignant neoplasm of overlapping sites of right female breast: Secondary | ICD-10-CM

## 2014-08-26 DIAGNOSIS — C50919 Malignant neoplasm of unspecified site of unspecified female breast: Secondary | ICD-10-CM

## 2014-08-26 DIAGNOSIS — C787 Secondary malignant neoplasm of liver and intrahepatic bile duct: Secondary | ICD-10-CM

## 2014-08-26 DIAGNOSIS — C7951 Secondary malignant neoplasm of bone: Secondary | ICD-10-CM

## 2014-08-26 DIAGNOSIS — Z5112 Encounter for antineoplastic immunotherapy: Secondary | ICD-10-CM

## 2014-08-26 LAB — COMPREHENSIVE METABOLIC PANEL (CC13)
ALK PHOS: 99 U/L (ref 40–150)
ALT: 10 U/L (ref 0–55)
AST: 12 U/L (ref 5–34)
Albumin: 3.5 g/dL (ref 3.5–5.0)
Anion Gap: 10 mEq/L (ref 3–11)
BUN: 11.4 mg/dL (ref 7.0–26.0)
CO2: 25 mEq/L (ref 22–29)
Calcium: 9.5 mg/dL (ref 8.4–10.4)
Chloride: 107 mEq/L (ref 98–109)
Creatinine: 0.9 mg/dL (ref 0.6–1.1)
EGFR: 82 mL/min/{1.73_m2} — ABNORMAL LOW (ref >90)
GLUCOSE: 101 mg/dL (ref 70–140)
POTASSIUM: 3.9 meq/L (ref 3.5–5.1)
SODIUM: 141 meq/L (ref 136–145)
TOTAL PROTEIN: 8.2 g/dL (ref 6.4–8.3)
Total Bilirubin: 0.84 mg/dL (ref 0.20–1.20)

## 2014-08-26 LAB — CBC WITH DIFFERENTIAL/PLATELET
BASO%: 0.7 % (ref 0.0–2.0)
Basophils Absolute: 0.1 10*3/uL (ref 0.0–0.1)
EOS ABS: 0.1 10*3/uL (ref 0.0–0.5)
EOS%: 1.5 % (ref 0.0–7.0)
HCT: 35.1 % (ref 34.8–46.6)
HGB: 11.7 g/dL (ref 11.6–15.9)
LYMPH%: 23.7 % (ref 14.0–49.7)
MCH: 26.7 pg (ref 25.1–34.0)
MCHC: 33.2 g/dL (ref 31.5–36.0)
MCV: 80.4 fL (ref 79.5–101.0)
MONO#: 0.7 10*3/uL (ref 0.1–0.9)
MONO%: 6.9 % (ref 0.0–14.0)
NEUT%: 67.2 % (ref 38.4–76.8)
NEUTROS ABS: 6.6 10*3/uL — AB (ref 1.5–6.5)
Platelets: 267 10*3/uL (ref 145–400)
RBC: 4.37 10*6/uL (ref 3.70–5.45)
RDW: 15.5 % — ABNORMAL HIGH (ref 11.2–14.5)
WBC: 9.8 10*3/uL (ref 3.9–10.3)
lymph#: 2.3 10*3/uL (ref 0.9–3.3)

## 2014-08-26 MED ORDER — ACETAMINOPHEN 325 MG PO TABS
650.0000 mg | ORAL_TABLET | Freq: Once | ORAL | Status: AC
  Administered 2014-08-26: 650 mg via ORAL

## 2014-08-26 MED ORDER — DIPHENHYDRAMINE HCL 25 MG PO CAPS
ORAL_CAPSULE | ORAL | Status: AC
  Filled 2014-08-26: qty 2

## 2014-08-26 MED ORDER — LORAZEPAM 2 MG/ML IJ SOLN
1.0000 mg | Freq: Once | INTRAMUSCULAR | Status: AC | PRN
  Administered 2014-08-26: 1 mg via INTRAVENOUS

## 2014-08-26 MED ORDER — SODIUM CHLORIDE 0.9 % IJ SOLN
10.0000 mL | Freq: Once | INTRAMUSCULAR | Status: AC
  Administered 2014-08-26: 10 mL
  Filled 2014-08-26: qty 10

## 2014-08-26 MED ORDER — LORAZEPAM 2 MG/ML IJ SOLN
INTRAMUSCULAR | Status: AC
  Filled 2014-08-26: qty 1

## 2014-08-26 MED ORDER — HEPARIN SOD (PORK) LOCK FLUSH 100 UNIT/ML IV SOLN
500.0000 [IU] | Freq: Once | INTRAVENOUS | Status: AC | PRN
  Administered 2014-08-26: 500 [IU]
  Filled 2014-08-26: qty 5

## 2014-08-26 MED ORDER — SODIUM CHLORIDE 0.9 % IV SOLN
Freq: Once | INTRAVENOUS | Status: AC
  Administered 2014-08-26: 10:00:00 via INTRAVENOUS

## 2014-08-26 MED ORDER — HEPARIN SOD (PORK) LOCK FLUSH 100 UNIT/ML IV SOLN
500.0000 [IU] | Freq: Once | INTRAVENOUS | Status: AC
  Administered 2014-08-26: 500 [IU]
  Filled 2014-08-26: qty 5

## 2014-08-26 MED ORDER — DIPHENHYDRAMINE HCL 25 MG PO CAPS
50.0000 mg | ORAL_CAPSULE | Freq: Once | ORAL | Status: AC
  Administered 2014-08-26: 50 mg via ORAL

## 2014-08-26 MED ORDER — SODIUM CHLORIDE 0.9 % IV SOLN
6.0000 mg/kg | Freq: Once | INTRAVENOUS | Status: AC
  Administered 2014-08-26: 756 mg via INTRAVENOUS
  Filled 2014-08-26: qty 36

## 2014-08-26 MED ORDER — ACETAMINOPHEN 325 MG PO TABS
ORAL_TABLET | ORAL | Status: AC
  Filled 2014-08-26: qty 2

## 2014-08-26 MED ORDER — SODIUM CHLORIDE 0.9 % IJ SOLN
10.0000 mL | INTRAMUSCULAR | Status: DC | PRN
  Administered 2014-08-26: 10 mL
  Filled 2014-08-26: qty 10

## 2014-08-26 NOTE — Patient Instructions (Signed)
Cancer Center Discharge Instructions for Patients Receiving Chemotherapy  Today you received the following chemotherapy agents herceptin   To help prevent nausea and vomiting after your treatment, we encourage you to take your nausea medication as directed   If you develop nausea and vomiting that is not controlled by your nausea medication, call the clinic.   BELOW ARE SYMPTOMS THAT SHOULD BE REPORTED IMMEDIATELY:  *FEVER GREATER THAN 100.5 F  *CHILLS WITH OR WITHOUT FEVER  NAUSEA AND VOMITING THAT IS NOT CONTROLLED WITH YOUR NAUSEA MEDICATION  *UNUSUAL SHORTNESS OF BREATH  *UNUSUAL BRUISING OR BLEEDING  TENDERNESS IN MOUTH AND THROAT WITH OR WITHOUT PRESENCE OF ULCERS  *URINARY PROBLEMS  *BOWEL PROBLEMS  UNUSUAL RASH Items with * indicate a potential emergency and should be followed up as soon as possible.  Feel free to call the clinic you have any questions or concerns. The clinic phone number is [phone].  

## 2014-09-13 ENCOUNTER — Ambulatory Visit: Payer: Commercial Managed Care - HMO | Admitting: Oncology

## 2014-09-13 ENCOUNTER — Other Ambulatory Visit: Payer: Commercial Managed Care - HMO

## 2014-09-16 ENCOUNTER — Ambulatory Visit: Payer: Commercial Managed Care - HMO

## 2014-09-16 ENCOUNTER — Other Ambulatory Visit: Payer: Self-pay | Admitting: Nurse Practitioner

## 2014-09-16 ENCOUNTER — Telehealth: Payer: Self-pay | Admitting: Oncology

## 2014-09-16 ENCOUNTER — Telehealth: Payer: Self-pay | Admitting: *Deleted

## 2014-09-16 ENCOUNTER — Ambulatory Visit (HOSPITAL_BASED_OUTPATIENT_CLINIC_OR_DEPARTMENT_OTHER): Payer: Commercial Managed Care - HMO

## 2014-09-16 ENCOUNTER — Telehealth: Payer: Self-pay | Admitting: Nurse Practitioner

## 2014-09-16 ENCOUNTER — Ambulatory Visit: Payer: Commercial Managed Care - HMO | Admitting: Oncology

## 2014-09-16 ENCOUNTER — Ambulatory Visit (HOSPITAL_COMMUNITY)
Admission: RE | Admit: 2014-09-16 | Discharge: 2014-09-16 | Disposition: A | Discharge status: Home / Self Care | Payer: Commercial Managed Care - HMO | Source: Ambulatory Visit | Attending: Oncology | Admitting: Oncology

## 2014-09-16 ENCOUNTER — Encounter: Payer: Self-pay | Admitting: Nurse Practitioner

## 2014-09-16 ENCOUNTER — Other Ambulatory Visit: Payer: Self-pay | Admitting: *Deleted

## 2014-09-16 ENCOUNTER — Ambulatory Visit: Payer: Commercial Managed Care - HMO | Admitting: Pharmacist

## 2014-09-16 ENCOUNTER — Ambulatory Visit (HOSPITAL_BASED_OUTPATIENT_CLINIC_OR_DEPARTMENT_OTHER): Payer: Commercial Managed Care - HMO | Admitting: Nurse Practitioner

## 2014-09-16 ENCOUNTER — Other Ambulatory Visit: Payer: Commercial Managed Care - HMO

## 2014-09-16 ENCOUNTER — Other Ambulatory Visit (HOSPITAL_BASED_OUTPATIENT_CLINIC_OR_DEPARTMENT_OTHER): Payer: Commercial Managed Care - HMO

## 2014-09-16 VITALS — BP 142/80 | HR 66 | Temp 98.6°F | Resp 18 | Ht 63.0 in | Wt 295.3 lb

## 2014-09-16 DIAGNOSIS — G8929 Other chronic pain: Secondary | ICD-10-CM

## 2014-09-16 DIAGNOSIS — C50919 Malignant neoplasm of unspecified site of unspecified female breast: Secondary | ICD-10-CM

## 2014-09-16 DIAGNOSIS — Z95828 Presence of other vascular implants and grafts: Secondary | ICD-10-CM

## 2014-09-16 DIAGNOSIS — C7951 Secondary malignant neoplasm of bone: Secondary | ICD-10-CM

## 2014-09-16 DIAGNOSIS — C50819 Malignant neoplasm of overlapping sites of unspecified female breast: Secondary | ICD-10-CM

## 2014-09-16 DIAGNOSIS — L03113 Cellulitis of right upper limb: Secondary | ICD-10-CM

## 2014-09-16 DIAGNOSIS — G620 Drug-induced polyneuropathy: Secondary | ICD-10-CM

## 2014-09-16 DIAGNOSIS — C787 Secondary malignant neoplasm of liver and intrahepatic bile duct: Secondary | ICD-10-CM

## 2014-09-16 DIAGNOSIS — Z7901 Long term (current) use of anticoagulants: Secondary | ICD-10-CM

## 2014-09-16 DIAGNOSIS — I871 Compression of vein: Secondary | ICD-10-CM

## 2014-09-16 DIAGNOSIS — Z5112 Encounter for antineoplastic immunotherapy: Secondary | ICD-10-CM

## 2014-09-16 LAB — COMPREHENSIVE METABOLIC PANEL (CC13)
ALBUMIN: 3.5 g/dL (ref 3.5–5.0)
ALT: 19 U/L (ref 0–55)
AST: 18 U/L (ref 5–34)
Alkaline Phosphatase: 99 U/L (ref 40–150)
Anion Gap: 10 mEq/L (ref 3–11)
BUN: 14.8 mg/dL (ref 7.0–26.0)
CHLORIDE: 104 meq/L (ref 98–109)
CO2: 26 meq/L (ref 22–29)
Calcium: 8.7 mg/dL (ref 8.4–10.4)
Creatinine: 1 mg/dL (ref 0.6–1.1)
EGFR: 74 mL/min/{1.73_m2} — AB (ref >90)
GLUCOSE: 108 mg/dL (ref 70–140)
Potassium: 4.1 mEq/L (ref 3.5–5.1)
Sodium: 140 mEq/L (ref 136–145)
TOTAL PROTEIN: 8.5 g/dL — AB (ref 6.4–8.3)
Total Bilirubin: 0.35 mg/dL (ref 0.20–1.20)

## 2014-09-16 LAB — CBC WITH DIFFERENTIAL/PLATELET
BASO%: 0.5 % (ref 0.0–2.0)
Basophils Absolute: 0 10*3/uL (ref 0.0–0.1)
EOS ABS: 0.2 10*3/uL (ref 0.0–0.5)
EOS%: 2.6 % (ref 0.0–7.0)
HCT: 35.9 % (ref 34.8–46.6)
HEMOGLOBIN: 12.3 g/dL (ref 11.6–15.9)
LYMPH%: 38.3 % (ref 14.0–49.7)
MCH: 26.9 pg (ref 25.1–34.0)
MCHC: 34.3 g/dL (ref 31.5–36.0)
MCV: 78.6 fL — ABNORMAL LOW (ref 79.5–101.0)
MONO#: 0.6 10*3/uL (ref 0.1–0.9)
MONO%: 8.6 % (ref 0.0–14.0)
NEUT%: 50 % (ref 38.4–76.8)
NEUTROS ABS: 3.3 10*3/uL (ref 1.5–6.5)
Platelets: 280 10*3/uL (ref 145–400)
RBC: 4.57 10*6/uL (ref 3.70–5.45)
RDW: 16 % — AB (ref 11.2–14.5)
WBC: 6.6 10*3/uL (ref 3.9–10.3)
lymph#: 2.5 10*3/uL (ref 0.9–3.3)
nRBC: 0 % (ref 0–0)

## 2014-09-16 LAB — PROTIME-INR
INR: 4.96 — ABNORMAL HIGH (ref 0.00–1.49)
PROTHROMBIN TIME: 46.5 s — AB (ref 11.6–15.2)

## 2014-09-16 LAB — POCT INR: INR: 4.96

## 2014-09-16 MED ORDER — ACETAMINOPHEN 325 MG PO TABS
650.0000 mg | ORAL_TABLET | Freq: Once | ORAL | Status: AC
  Administered 2014-09-16: 650 mg via ORAL

## 2014-09-16 MED ORDER — SODIUM CHLORIDE 0.9 % IV SOLN
6.0000 mg/kg | Freq: Once | INTRAVENOUS | Status: AC
  Administered 2014-09-16: 756 mg via INTRAVENOUS
  Filled 2014-09-16: qty 36

## 2014-09-16 MED ORDER — LORAZEPAM 2 MG/ML IJ SOLN
1.0000 mg | Freq: Once | INTRAMUSCULAR | Status: AC | PRN
  Administered 2014-09-16: 1 mg via INTRAVENOUS

## 2014-09-16 MED ORDER — ACETAMINOPHEN 325 MG PO TABS
ORAL_TABLET | ORAL | Status: AC
  Filled 2014-09-16: qty 2

## 2014-09-16 MED ORDER — DIPHENHYDRAMINE HCL 25 MG PO CAPS
50.0000 mg | ORAL_CAPSULE | Freq: Once | ORAL | Status: AC
  Administered 2014-09-16: 50 mg via ORAL

## 2014-09-16 MED ORDER — TEMAZEPAM 30 MG PO CAPS: 30.0000 mg | ORAL_CAPSULE | Freq: Every evening | ORAL | Status: DC | PRN

## 2014-09-16 MED ORDER — DIPHENHYDRAMINE HCL 25 MG PO CAPS
ORAL_CAPSULE | ORAL | Status: AC
  Filled 2014-09-16: qty 2

## 2014-09-16 MED ORDER — SODIUM CHLORIDE 0.9 % IJ SOLN
10.0000 mL | INTRAMUSCULAR | Status: DC | PRN
  Administered 2014-09-16: 10 mL via INTRAVENOUS
  Filled 2014-09-16: qty 10

## 2014-09-16 MED ORDER — SODIUM CHLORIDE 0.9 % IJ SOLN
10.0000 mL | INTRAMUSCULAR | Status: DC | PRN
  Administered 2014-09-16: 10 mL
  Filled 2014-09-16: qty 10

## 2014-09-16 MED ORDER — BACLOFEN 10 MG PO TABS: 10.0000 mg | ORAL_TABLET | Freq: Three times a day (TID) | ORAL | Status: DC | PRN

## 2014-09-16 MED ORDER — SODIUM CHLORIDE 0.9 % IV SOLN
Freq: Once | INTRAVENOUS | Status: AC
  Administered 2014-09-16: 10:00:00 via INTRAVENOUS

## 2014-09-16 MED ORDER — HEPARIN SOD (PORK) LOCK FLUSH 100 UNIT/ML IV SOLN
500.0000 [IU] | Freq: Once | INTRAVENOUS | Status: AC | PRN
  Administered 2014-09-16: 500 [IU]
  Filled 2014-09-16: qty 5

## 2014-09-16 MED ORDER — LORAZEPAM 2 MG/ML IJ SOLN
INTRAMUSCULAR | Status: AC
  Filled 2014-09-16: qty 1

## 2014-09-16 MED ORDER — ALPRAZOLAM 1 MG PO TABS: 1.0000 mg | ORAL_TABLET | Freq: Three times a day (TID) | ORAL | Status: DC | PRN

## 2014-09-16 NOTE — Telephone Encounter (Signed)
per Gerald Stabs in [place] to sch CC-pt aware

## 2014-09-16 NOTE — Telephone Encounter (Signed)
per pof to sch pt appt-gave pt copy of sch-sch Bensihmon-sent MW email to sch pt trmt-will get pt updated after reply

## 2014-09-16 NOTE — Patient Instructions (Signed)
[facility] Discharge Instructions for Patients Receiving Chemotherapy  Today you received the following chemotherapy agents: Herceptin  To help prevent nausea and vomiting after your treatment, we encourage you to take your nausea medication as prescribed by your physician.    If you develop nausea and vomiting that is not controlled by your nausea medication, call the clinic.   BELOW ARE SYMPTOMS THAT SHOULD BE REPORTED IMMEDIATELY:  *FEVER GREATER THAN 100.5 F  *CHILLS WITH OR WITHOUT FEVER  NAUSEA AND VOMITING THAT IS NOT CONTROLLED WITH YOUR NAUSEA MEDICATION  *UNUSUAL SHORTNESS OF BREATH  *UNUSUAL BRUISING OR BLEEDING  TENDERNESS IN MOUTH AND THROAT WITH OR WITHOUT PRESENCE OF ULCERS  *URINARY PROBLEMS  *BOWEL PROBLEMS  UNUSUAL RASH Items with * indicate a potential emergency and should be followed up as soon as possible.  Feel free to call the clinic you have any questions or concerns. The clinic phone number is [phone].    

## 2014-09-16 NOTE — Progress Notes (Signed)
INR above goal [name] is doing well today and is here for Herceptin No unusual bleeding or bruising No missed or extra doses Patients diet has drastically changed due to her daughter moving in with her Her daughter now cooks most of the meals so [name] is eating less collard greens and turnip greens This has likely resulted in her INR being elevated [name] states this is likely to continue so I will make a ~15% dose decrease on her weekly regimen We will see [name] with her next treatment. Attempted to have [name] return earlier (in 1-2 weeks) but she was adamant about not returning until her next treatment Plan: Hold coumadin x 2 days then decrease dose to Coumadin 5mg  daily except 7.5mg  on Mondays, Wednesdays and Fridays.  Recheck INR on 10/07/14 and we will see [name] in infusion area

## 2014-09-16 NOTE — Progress Notes (Signed)
[name]  [phone] [phone]  OFFICE PROGRESS NOTE  ID: [name]   DOB: 05/29/1951  MR#: [id number]  LHT#:[id number]  PCP: Ernestene Kiel, MD GYN:  SU:  OTHER MD: Pierre Bali, Johnnette Gourd, Wendee Copp  CC: Metastatic HER-2 positive breast cancer.  CURRENT TREATMENT: Trastuzumab  BREAST CANCER HISTORY:   From the earlier summary:  [name] is 64 years old [name] female.  This woman has been in good health all of her life.  She noted a swelling and discomfort in her right breast in June 2004.  She was seen in the Emergency Room in [place] and was treated for mastitis.  She was treated for a number of months with mastitis and the swelling did not get better. She was given hydrocodone and Cipro.  Finally, the swelling did get better and ultimately the nipple became retracted and she noticed some dimpling in her skin. She had a mammogram in July of 2004 in [place] with subsequent mammogram on May 27, 2003, by Dr. Isaiah Blakes.  Mammogram done on September 30 showed marked increased density in the left breast.  Biopsy was performed the same day.  It was noted at the 12 o'clock position, deep in the breast was a focal hypoechoic mass, at least 3.5 cm in diameter.  Biopsy did in fact show invasive in situ mammary carcinoma. This was felt to be both at least intermediate, high grade.  No definite lymphovascular invasion was identified. ER and PR negative, Her2 testing positive. [name] continues to have pain in her breast.  She continues to take hydrocodone a number of times a day.  She has been seen by Dr. Rosana Hoes, who felt that neoadjuvant chemotherapy would be required.    Initial staging studies showed evidence of liver and lung mets.   Patient also has evidence of bone lesions. Patient started neoadjuvant chemotherapy, Taxotere/Carbo/Herceptin in October 2004.   Patient had a CT scan in December 2004 which  demonstrated extensive clot in the SVC innominate vein, bilateral jugular vein and  She was started on anticoagulation therapy. She received a total of 6 cycles of Taxotere/Carbo/Herceptin, completed in April 2005."  Patient has been on  trastuzumab continued indefinitely; has also received lapatinib and capecitabine for variable intervals in 2007-2008. Most recent echo 12/01/2013 showed an ejection fraction of 55%. She is status post bilateral mastectomies with bilateral axillary lymph node dissection 12/07/2004, showing (a) on the right, a mypT1c ypN1 invasive ductal carcinoma, grade 3, estrogen and progesterone receptor negative, HER-2 positive, with an MIB-1 of 31% (b) on the left, ypT2 ypN1 invasive ductal carcinoma, grade 2, estrogen and progesterone receptor negative, HER-2 positive, with an MIB-1 of 35%. She is Status post radiation June through July of 2006, to the right chest wall, left chest wall, bilateral supraclavicular fossae, and bilateral axillary boosts; with additional radiation to the right and left chest walls and the central chest wall completed November of 2007. She is status post ixempra x9 completed August of 2009. 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 Patient has chronic pain, with negative PET scan 08/24/2013 (no evidence of active cancer). On Neurontin and Tramadol therapy.  Her subsequent history is as detailed below  INTERVAL HISTORY: [name] returns today for followup of her stage IV breast cancer accompanied by her husband, [name]. She is due for trastuzumab. The interval history is generally unremarkable, just the "same old aches and  pains." She uses baclofen for her chest wall spasms. She applies voltaren gel to her knees and shoulders and has tramadol PRN for pain.   REVIEW OF SYSTEMS: [name] denies fevers, chills, nausea, vomiting, or changes in bowel or bladder habits. She has no  headaches, dizziness , unexplained bleeding, or bruising. Her appetite is "too good" and she is staying well hydrated. She denies shortness of breath, chest pain, cough, or palpitations. She gets on average 4 hours of sleep nightly even with temazepam and ambien. This leaves her fatigued frequently during the day. A detailed review of systems is otherwise negative.   PAST MEDICAL HISTORY: Past Medical History  Diagnosis Date  . Breast cancer     mets to liver and lung  . Hypertension   . SVC syndrome   . History of chemotherapy Feb. 2006    taxotere/herceptin/carboplatin  . Radiation 07/31/2006    left upper chest  . Radiation 06/17/2006-06/27/2006    6480 cGy bilat. chest wall  . Neuropathy   . Thrombosis   . Breast cancer metastasized to multiple sites 02/26/2013    PAST SURGICAL HISTORY: Past Surgical History  Procedure Laterality Date  . Tubal ligation  1986  . Cholecystectomy  1989  . Mastectomy Bilateral   . Ankle surgery    . Peripherally inserted central catheter insertion    . Back surgery      FAMILY HISTORY Family History  Problem Relation Age of Onset  . Heart failure Father   . Cancer Father     Prostate cancer  . Heart failure Brother   . Cancer Brother     Prostate cancer  . Diabetes Maternal Aunt    She had three brothers, one died of gunshot wound, one of complications of diabetes mellitus and one of myocardial infarction.  She has no sisters.  Mother died of complications of brain metastasis in 15.  Father has had a myocardial infarction in 1999.  No history of breast or ovarian cancer in the family.     GYNECOLOGIC HISTORY:   Menarche at age 51.  Gravida 3, para 3.  First live birth at age 40.  No history of breast feeding. No history of hormonal replacement therapy.   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 Her husband used to work as a Art gallery manager, but is now retired. She has  three children, [name] who lives in [place] and works as a Hydrographic surveyor, [name] who lives in [place] and works as a Administrator, and [name] who lives in [place] and also works as a Hydrographic surveyor. The patient has 12 grandchildren and 4 great-grandchildren. She attends a [employer]. Very involved with school kids.  ADVANCED DIRECTIVES:  Not in place  HEALTH MAINTENANCE: (Updated 06/12/2013) History  Substance Use Topics  . Smoking status: Never Smoker   . Smokeless tobacco: Never Used  . Alcohol Use: Yes     Comment: occasional     Colonoscopy: Never and "I don't want one"  PAP:  1987  Bone density:  Never  Lipid panel:  Not on file    Allergies  Allergen Reactions  . Penicillins Hives    PATIENT HAS TOLERATED CEPHALOSPORINGS  . Adhesive [Tape] Other (See Comments)    Tears skin     Current Outpatient Prescriptions  Medication Sig Dispense Refill  . acetaminophen (TYLENOL) 500 MG tablet Take 1,000 mg by mouth every 6 (  six) hours as needed for mild pain or fever.     Marland Kitchen amLODipine (NORVASC) 10 MG tablet Take 10 mg by mouth every morning.    . baclofen (LIORESAL) 10 MG tablet Take 10 mg by mouth 3 (three) times daily as needed for muscle spasms.     . carvedilol (COREG) 3.125 MG tablet TAKE 1 TABLET BY MOUTH TWICE DAILY 180 tablet 3  . diclofenac sodium (VOLTAREN) 1 % GEL Apply 2 g topically daily as needed (for pain). Apply to knees and shoulders 100 g 6  . furosemide (LASIX) 80 MG tablet Take 80 mg by mouth daily as needed for fluid.     Marland Kitchen gabapentin (NEURONTIN) 300 MG capsule TAKE 2 CAPSULES BY MOUTH THREE TIMES DAILY 180 capsule 1  . losartan (COZAAR) 100 MG tablet Take 100 mg by mouth every morning.    . potassium chloride (K-DUR,KLOR-CON) 10 MEQ tablet Take 10 mEq by mouth 2 (two) times daily.    Marland Kitchen PRESCRIPTION MEDICATION Chemo - [facility]    . spironolactone (ALDACTONE) 25 MG tablet Take 0.5 tablets (12.5 mg total) by mouth daily. 30 tablet 3  . traMADol  (ULTRAM) 50 MG tablet Take 1 tablet (50 mg total) by mouth every 6 (six) hours as needed. 120 tablet 0  . warfarin (COUMADIN) 5 MG tablet Take 5-7.5 mg by mouth See admin instructions. Takes 7.69m everyday except 595mon Friday's    . zolpidem (AMBIEN) 10 MG tablet     . albuterol (PROVENTIL HFA;VENTOLIN HFA) 108 (90 BASE) MCG/ACT inhaler Inhale 2 puffs into the lungs every 6 (six) hours as needed for wheezing. (Patient not taking: Reported on 08/06/2014) 1 Inhaler 3  . ALPRAZolam (XANAX) 1 MG tablet Take 1 tablet (1 mg total) by mouth 3 (three) times daily as needed for anxiety. 90 tablet 1  . temazepam (RESTORIL) 30 MG capsule Take 1 capsule (30 mg total) by mouth at bedtime as needed for sleep. 30 capsule 1   No current facility-administered medications for this visit.   Facility-Administered Medications Ordered in Other Visits  Medication Dose Route Frequency Provider Last Rate Last Dose  . sodium chloride 0.9 % injection 10 mL  10 mL Intravenous PRN GuChauncey CruelMD   10 mL at 12/17/13 1103    OBJECTIVE:  Middle aged African American woman who appears stated age Fi47itals:   09/16/14 0855  BP: 142/80  Pulse: 66  Temp: 98.6 F (37 C)  Resp: 18     Body mass index is 52.32 kg/(m^2).    ECOG FS: 1 Filed Weights   09/16/14 0855  Weight: 295 lb 4.8 oz (133.947 kg)   Filed Weights   09/16/14 0855  Weight: 295 lb 4.8 oz (133.947 kg)   Skin: warm, dry  HEENT: sclerae anicteric, conjunctivae pink, oropharynx clear. No thrush or mucositis.  Lymph Nodes: No cervical or supraclavicular lymphadenopathy  Lungs: clear to auscultation bilaterally, no rales, wheezes, or rhonci  Heart: regular rate and rhythm  Abdomen: round, soft, non tender, positive bowel sounds  Musculoskeletal: No focal spinal tenderness, no peripheral edema  Neuro: non focal, well oriented, positive affect  Breasts: deferred  LAB RESULTS: Lab Results  Component Value Date   WBC 6.6 09/16/2014   NEUTROABS  3.3 09/16/2014   HGB 12.3 09/16/2014   HCT 35.9 09/16/2014   MCV 78.6* 09/16/2014   PLT 280 09/16/2014      Chemistry      Component Value Date/Time   NA 140  09/16/2014 0818   NA 138 06/19/2014 0500   K 4.1 09/16/2014 0818   K 3.5* 06/19/2014 0500   CL 102 06/19/2014 0500   CL 104 01/30/2013 0850   CO2 26 09/16/2014 0818   CO2 24 06/19/2014 0500   BUN 14.8 09/16/2014 0818   BUN 11 06/19/2014 0500   CREATININE 1.0 09/16/2014 0818   CREATININE 0.89 06/19/2014 0500      Component Value Date/Time   CALCIUM 8.7 09/16/2014 0818   CALCIUM 9.0 06/19/2014 0500   ALKPHOS 99 09/16/2014 0818   ALKPHOS 87 04/11/2012 0837   AST 18 09/16/2014 0818   AST 19 04/11/2012 0837   ALT 19 09/16/2014 0818   ALT 14 04/11/2012 0837   BILITOT 0.35 09/16/2014 0818   BILITOT 0.8 04/11/2012 0837     STUDIES: No results found. Most recent echocardiogram on 07/16/14 showed an EF of 60-65%  ASSESSMENT: 64 y.o.  [place], [place], woman  (1)  with a history of inflammatory right breast cancer metastatic at presentation September 2004 with involvement of liver and bone, HER-2 positive, estrogen and progesterone receptor negative  (2) treated with carboplatin, docetaxel and Herceptin x 6 completed April 2005  (3) trastuzumab continued indefinitely; has also received lapatinib and capecitabine for variable intervals in 2007-2008. Most recent echo 03/02/2014 showed an ejection fraction of 55-60%.  (4) status post bilateral mastectomies with bilateral axillary lymph node dissection 12/07/2004, showing  (a) on the right, a mypT1c ypN1 invasive ductal carcinoma, grade 3, estrogen and progesterone receptor negative, HER-2 positive, with an MIB-1 of 31%  (b) on the left, ypT2 ypN1 invasive ductal carcinoma, grade 2, estrogen and progesterone receptor negative, HER-2 positive, with an MIB-1 of 35%.  (5)  Status post radiation June through July of 2006, to the right chest wall, left chest wall,  bilateral supraclavicular fossae, and bilateral axillary boosts; with additional radiation to the right and left chest walls and the central chest wall completed November of 2007  (6) status post Ixempra x9 completed August of 2009.  (7) history of superior vena caval syndrome, on life long anticoagulation   (8)  History of chemotherapy-induced neuropathy.   (9)  chronic pain, with negative PET scan 08/24/2013 (no evidence of active cancer). On Neurontin and Tramadol  (10) right upper extremity cellulitis, no bacteremia; treated with cephalexin /doxycycline for 2 weeks, with resolution   PLAN: Zyasia continues to do well as far as her metastatic breast cancer is concerned. She is now 11 years out from her presentation of mets to the liver, with no evidence of active breast cancer. The labs were reviewed in detail and were entirely stable. We will proceed with trastuzumab as scheduled. Her next echcocardiogram is due next month and I have written orders for this procedure during this visit.   I have refilled a number of her prescriptions today, including her baclofen, restoril, and xanax.  Henleigh will continue with trastuzuamb every 3 weeks indefinitely. She will have a repeat PET scan in April and will follow up with labs and an office visit with Dr. Jana Hakim to review the results. She understands and agrees with this plan. She knows the goal of treatment in her case is control. She has been encouraged to call with any issues that might arise before her next visit here.   Marcelino Duster, NP  09/16/2014 9:33 AM

## 2014-09-16 NOTE — Patient Instructions (Addendum)
INR above goal Hold coumadin x 2 days then decrease dose to Coumadin 5mg  daily except 7.5mg  on Mondays, Wednesdays and Fridays.  Recheck INR on 10/07/14 and we will see patient in infusion area

## 2014-09-16 NOTE — Patient Instructions (Signed)

## 2014-09-16 NOTE — Telephone Encounter (Signed)
Per staff message and POF I have scheduled appts. Advised scheduler of appts. JMW  

## 2014-09-30 ENCOUNTER — Ambulatory Visit (HOSPITAL_BASED_OUTPATIENT_CLINIC_OR_DEPARTMENT_OTHER)
Admission: RE | Admit: 2014-09-30 | Discharge: 2014-09-30 | Disposition: A | Discharge status: Home / Self Care | Payer: Commercial Managed Care - HMO | Source: Ambulatory Visit | Attending: Cardiology | Admitting: Cardiology

## 2014-09-30 ENCOUNTER — Encounter (HOSPITAL_COMMUNITY): Payer: Self-pay

## 2014-09-30 ENCOUNTER — Ambulatory Visit (HOSPITAL_COMMUNITY)
Admission: RE | Admit: 2014-09-30 | Discharge: 2014-09-30 | Disposition: A | Discharge status: Home / Self Care | Payer: Commercial Managed Care - HMO | Source: Ambulatory Visit | Attending: Nurse Practitioner | Admitting: Nurse Practitioner

## 2014-09-30 DIAGNOSIS — C50919 Malignant neoplasm of unspecified site of unspecified female breast: Secondary | ICD-10-CM

## 2014-09-30 DIAGNOSIS — C799 Secondary malignant neoplasm of unspecified site: Secondary | ICD-10-CM | POA: Diagnosis not present

## 2014-09-30 DIAGNOSIS — C50911 Malignant neoplasm of unspecified site of right female breast: Secondary | ICD-10-CM

## 2014-09-30 DIAGNOSIS — I517 Cardiomegaly: Secondary | ICD-10-CM

## 2014-09-30 NOTE — Patient Instructions (Signed)
Your physician recommends that you schedule a follow-up appointment in: 3 months with echocardiogram  

## 2014-09-30 NOTE — Progress Notes (Signed)
  Echocardiogram 2D Echocardiogram has been performed.  [name] 09/30/2014, 9:58 AM

## 2014-09-30 NOTE — Progress Notes (Signed)
Patient ID: [name], female   DOB: [birth year], 64 y.o.   MRN: [id number]  Oncologist: Dr Jana Hakim  HPI: [name] is a 64 year old with a history of metastatic HER-2 positive breast carcinoma originally diagnosed September 2004. Started in R breast. Underwent neo-adjuvant chemo. During this time developed L breast CA. Underwent bilateral mastectomy. Lymph nodes +. 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   She received a total of 6 cycles of Taxotere/Carbo/Herceptin, completed in April 2005, after which she began single agent Herceptin, given every 3 weeks. Plan to continue on Herceptin indefinitely.   At last appointment lateral s' and strain were worse by echo, but the study was difficult to interpret with poor endocardial definition.  Therefore, I did not stop Herceptin but decreased the interval for her followup echo.  She returns today, strain and lateral s' parameters look much better.  She denies dyspnea or chest pain.   Labs (11/15): K 4, creatinine 1.0   Labs (1/16): K 4.1, creatinine 1.0  04/23/12 ECHO EF 60-65% Lateral s' 8.9 cm/s  07/30/12 ECHO EF 60-65% Lateral s' 8.3 cm/s  09/11/12 ECHO EF 60-65% Lateral s' 10.3 cm/s  12/22/12 ECHO 55-60% Lateral S' 9.8 RV mildly dilated  07/01/13 ECHO 55-60%, lateral s' 9.79, mild RV dilation, grade II DD 3/15 ECHO 55%, mild MR, lateral s' 9.6, GLS -19.2% 03/02/14 ECHO EF 55-60% Lateral S' 9.4 GLS - 21.9  11/15 ECHO EF 60-65%, lateral S' 6.8, GLS -17.2%, mild RV dilation with normal systolic function, mild MR.  2/16 ECHO EF 60-65%, lateral S' 14.4, GLS -32.7%, normal diastolic function, mild RV dilation with normal RV systolic function.   ROS: All systems negative except as listed in HPI, PMH and Problem List.  Past Medical History  Diagnosis Date  . Breast cancer     mets to liver and lung  . Hypertension   . SVC syndrome   . History of chemotherapy Feb. 2006   taxotere/herceptin/carboplatin  . Radiation 07/31/2006    left upper chest  . Radiation 06/17/2006-06/27/2006    6480 cGy bilat. chest wall  . Neuropathy   . Thrombosis   . Breast cancer metastasized to multiple sites 02/26/2013    Current Outpatient Prescriptions  Medication Sig Dispense Refill  . acetaminophen (TYLENOL) 500 MG tablet Take 1,000 mg by mouth every 6 (six) hours as needed for mild pain or fever.     Marland Kitchen albuterol (PROVENTIL HFA;VENTOLIN HFA) 108 (90 BASE) MCG/ACT inhaler Inhale 2 puffs into the lungs every 6 (six) hours as needed for wheezing. 1 Inhaler 3  . ALPRAZolam (XANAX) 1 MG tablet Take 1 tablet (1 mg total) by mouth 3 (three) times daily as needed for anxiety. 90 tablet 1  . amLODipine (NORVASC) 10 MG tablet Take 10 mg by mouth every morning.    . baclofen (LIORESAL) 10 MG tablet TAKE 1 TABLET BY MOUTH THREE TIMES DAILY AS NEEDED FOR MUSCLE SPASMS 270 tablet 1  . carvedilol (COREG) 3.125 MG tablet TAKE 1 TABLET BY MOUTH TWICE DAILY 180 tablet 3  . diclofenac sodium (VOLTAREN) 1 % GEL Apply 2 g topically daily as needed (for pain). Apply to knees and shoulders 100 g 6  . furosemide (LASIX) 80 MG tablet Take 80 mg by mouth daily as needed for fluid.     Marland Kitchen gabapentin (NEURONTIN) 300 MG capsule TAKE 2 CAPSULES BY MOUTH THREE TIMES DAILY 180 capsule 1  . losartan (COZAAR) 100 MG tablet Take 100  mg by mouth every morning.    . potassium chloride (K-DUR,KLOR-CON) 10 MEQ tablet Take 10 mEq by mouth 2 (two) times daily.    Marland Kitchen PRESCRIPTION MEDICATION Chemo - [facility]    . spironolactone (ALDACTONE) 25 MG tablet Take 0.5 tablets (12.5 mg total) by mouth daily. 30 tablet 3  . temazepam (RESTORIL) 30 MG capsule Take 1 capsule (30 mg total) by mouth at bedtime as needed for sleep. 30 capsule 1  . traMADol (ULTRAM) 50 MG tablet Take 1 tablet (50 mg total) by mouth every 6 (six) hours as needed. 120 tablet 0  . warfarin (COUMADIN) 5 MG tablet Take 5-7.5 mg by mouth See admin instructions.  Takes 7.78m everyday except 568mon Friday's    . zolpidem (AMBIEN) 10 MG tablet      No current facility-administered medications for this encounter.   Facility-Administered Medications Ordered in Other Encounters  Medication Dose Route Frequency Provider Last Rate Last Dose  . sodium chloride 0.9 % injection 10 mL  10 mL Intravenous PRN GuChauncey CruelMD   10 mL at 12/17/13 1103    Filed Vitals:   09/30/14 0953  BP: 119/58  Pulse: 83  Resp: 18  Weight: 303 lb 8 oz (137.667 kg)  SpO2: 95%    PHYSICAL EXAM: General: NAD, obese. HEENT: normal  Neck: Thick. no JVD difficult to assess d/t body habitus but does not appear elevated; Carotids 2+ bilat; no bruits. No lymphadenopathy or thryomegaly appreciated.  Cor: PMI nondisplaced. Regular rate & rhythm. No rubs, gallops or murmurs. S/P bilateral mastectomies  Lungs: clear  Abdomen: obese. soft, nontender, nondistended. Good bowel sounds.  Extremities: no cyanosis, clubbing, rash.  No edema.  Neuro: alert & oriented x 3, cranial nerves grossly intact. moves all 4 extremities w/o difficulty. Affect pleasant.  ASSESSMENT & PLAN: 1) L Breast Cancer s/p bilateral mastectomies:  Symptomatically stable. Today's echo was a better study than in 11/15.  Her lateral s' and strain parameters are improved back to her prior baseline.  EF is normal.  She can continue Herceptin.  She will also continue losartan and Coreg.  Followup in 3 months for echo and office visit.  2) Suspected OSA: Patient has thick neck, mild RV dilation, snores at night.  Prefers to hold on sleep study.   3) HTN: Stable .   DaLoralie Champagne/11/2014

## 2014-10-02 ENCOUNTER — Emergency Department (HOSPITAL_COMMUNITY)
Admission: EM | Admit: 2014-10-02 | Discharge: 2014-10-02 | Disposition: A | Discharge status: Home / Self Care | Payer: Commercial Managed Care - HMO | Attending: Emergency Medicine | Admitting: Emergency Medicine

## 2014-10-02 ENCOUNTER — Emergency Department (HOSPITAL_COMMUNITY): Payer: Commercial Managed Care - HMO

## 2014-10-02 ENCOUNTER — Encounter (HOSPITAL_COMMUNITY): Payer: Self-pay | Admitting: Emergency Medicine

## 2014-10-02 DIAGNOSIS — I10 Essential (primary) hypertension: Secondary | ICD-10-CM | POA: Diagnosis not present

## 2014-10-02 DIAGNOSIS — Z853 Personal history of malignant neoplasm of breast: Secondary | ICD-10-CM | POA: Insufficient documentation

## 2014-10-02 DIAGNOSIS — X58XXXA Exposure to other specified factors, initial encounter: Secondary | ICD-10-CM | POA: Insufficient documentation

## 2014-10-02 DIAGNOSIS — Y9389 Activity, other specified: Secondary | ICD-10-CM | POA: Insufficient documentation

## 2014-10-02 DIAGNOSIS — Z880 Allergy status to penicillin: Secondary | ICD-10-CM | POA: Insufficient documentation

## 2014-10-02 DIAGNOSIS — E669 Obesity, unspecified: Secondary | ICD-10-CM | POA: Diagnosis not present

## 2014-10-02 DIAGNOSIS — Z791 Long term (current) use of non-steroidal anti-inflammatories (NSAID): Secondary | ICD-10-CM | POA: Diagnosis not present

## 2014-10-02 DIAGNOSIS — Z7901 Long term (current) use of anticoagulants: Secondary | ICD-10-CM | POA: Diagnosis not present

## 2014-10-02 DIAGNOSIS — Y998 Other external cause status: Secondary | ICD-10-CM | POA: Diagnosis not present

## 2014-10-02 DIAGNOSIS — Z9221 Personal history of antineoplastic chemotherapy: Secondary | ICD-10-CM | POA: Insufficient documentation

## 2014-10-02 DIAGNOSIS — Y9289 Other specified places as the place of occurrence of the external cause: Secondary | ICD-10-CM | POA: Diagnosis not present

## 2014-10-02 DIAGNOSIS — Z8505 Personal history of malignant neoplasm of liver: Secondary | ICD-10-CM | POA: Diagnosis not present

## 2014-10-02 DIAGNOSIS — Z923 Personal history of irradiation: Secondary | ICD-10-CM | POA: Diagnosis not present

## 2014-10-02 DIAGNOSIS — S8991XA Unspecified injury of right lower leg, initial encounter: Secondary | ICD-10-CM | POA: Diagnosis not present

## 2014-10-02 DIAGNOSIS — M25561 Pain in right knee: Secondary | ICD-10-CM

## 2014-10-02 DIAGNOSIS — Z79899 Other long term (current) drug therapy: Secondary | ICD-10-CM | POA: Insufficient documentation

## 2014-10-02 DIAGNOSIS — Z85118 Personal history of other malignant neoplasm of bronchus and lung: Secondary | ICD-10-CM | POA: Diagnosis not present

## 2014-10-02 MED ORDER — OXYCODONE HCL 5 MG PO TABS
ORAL_TABLET | ORAL | Status: DC
Start: 2014-10-02 — End: 2014-12-20

## 2014-10-02 MED ORDER — OXYCODONE-ACETAMINOPHEN 5-325 MG PO TABS
1.0000 | ORAL_TABLET | Freq: Once | ORAL | Status: AC
  Administered 2014-10-02: 1 via ORAL
  Filled 2014-10-02: qty 1

## 2014-10-02 NOTE — ED Provider Notes (Signed)
CSN: [id number]     Arrival date & time 10/02/14  0723 History   First MD Initiated Contact with Patient 10/02/14 0750     Chief Complaint  Patient presents with  . Knee Pain     (Consider location/radiation/quality/duration/timing/severity/associated sxs/prior Treatment) Patient is a 64 y.o. female presenting with knee pain. The history is provided by the patient.  Knee Pain Location:  Knee Time since incident:  17 days Injury: yes   Mechanism of injury comment:  Twisted Knee location:  R knee Pain details:    Quality:  Aching   Radiates to:  Does not radiate   Severity:  Mild   Onset quality:  Sudden   Duration:  17 days   Timing:  Constant   Progression:  Unchanged Chronicity:  New Dislocation: no   Foreign body present:  No foreign bodies Prior injury to area:  No Relieved by:  Nothing Worsened by:  Bearing weight Ineffective treatments: tramadol, salves such as icy hot. Associated symptoms: no back pain, no fatigue, no fever and no neck pain     Past Medical History  Diagnosis Date  . Breast cancer     mets to liver and lung  . Hypertension   . SVC syndrome   . History of chemotherapy Feb. 2006    taxotere/herceptin/carboplatin  . Radiation 07/31/2006    left upper chest  . Radiation 06/17/2006-06/27/2006    6480 cGy bilat. chest wall  . Neuropathy   . Thrombosis   . Breast cancer metastasized to multiple sites 02/26/2013   Past Surgical History  Procedure Laterality Date  . Tubal ligation  1986  . Cholecystectomy  1989  . Mastectomy Bilateral   . Ankle surgery    . Peripherally inserted central catheter insertion    . Back surgery     Family History  Problem Relation Age of Onset  . Heart failure Father   . Cancer Father     Prostate cancer  . Heart failure Brother   . Cancer Brother     Prostate cancer  . Diabetes Maternal Aunt    History  Substance Use Topics  . Smoking status: Never Smoker   . Smokeless tobacco: Never Used  . Alcohol Use:  Yes     Comment: occasional   OB History    No data available     Review of Systems  Constitutional: Negative for fever and fatigue.  HENT: Negative for congestion and drooling.   Eyes: Negative for pain.  Respiratory: Negative for cough and shortness of breath.   Cardiovascular: Negative for chest pain.  Gastrointestinal: Negative for nausea, vomiting, abdominal pain and diarrhea.  Genitourinary: Negative for dysuria and hematuria.  Musculoskeletal: Negative for back pain, gait problem and neck pain.       Right knee pain  Skin: Negative for color change.  Neurological: Negative for dizziness and headaches.  Hematological: Negative for adenopathy.  Psychiatric/Behavioral: Negative for behavioral problems.  All other systems reviewed and are negative.     Allergies  Penicillins and Adhesive  Home Medications   Prior to Admission medications   Medication Sig Start Date End Date Taking? Authorizing Provider  acetaminophen (TYLENOL) 500 MG tablet Take 1,000 mg by mouth every 6 (six) hours as needed for mild pain or fever.     Historical Provider, MD  albuterol (PROVENTIL HFA;VENTOLIN HFA) 108 (90 BASE) MCG/ACT inhaler Inhale 2 puffs into the lungs every 6 (six) hours as needed for wheezing. 03/04/14   Virgie Dad  Magrinat, MD  ALPRAZolam (XANAX) 1 MG tablet Take 1 tablet (1 mg total) by mouth 3 (three) times daily as needed for anxiety. 09/16/14   Marcelino Duster, NP  amLODipine (NORVASC) 10 MG tablet Take 10 mg by mouth every morning.    Historical Provider, MD  baclofen (LIORESAL) 10 MG tablet TAKE 1 TABLET BY MOUTH THREE TIMES DAILY AS NEEDED FOR MUSCLE SPASMS 09/17/14   Chauncey Cruel, MD  carvedilol (COREG) 3.125 MG tablet TAKE 1 TABLET BY MOUTH TWICE DAILY 07/16/14   Jolaine Artist, MD  diclofenac sodium (VOLTAREN) 1 % GEL Apply 2 g topically daily as needed (for pain). Apply to knees and shoulders 08/03/13   Chauncey Cruel, MD  furosemide (LASIX) 80 MG tablet Take 80  mg by mouth daily as needed for fluid.     Historical Provider, MD  gabapentin (NEURONTIN) 300 MG capsule TAKE 2 CAPSULES BY MOUTH THREE TIMES DAILY 08/23/14   Chauncey Cruel, MD  losartan (COZAAR) 100 MG tablet Take 100 mg by mouth every morning.    Historical Provider, MD  potassium chloride (K-DUR,KLOR-CON) 10 MEQ tablet Take 10 mEq by mouth 2 (two) times daily.    Historical Provider, MD  [place]    Historical Provider, MD  spironolactone (ALDACTONE) 25 MG tablet Take 0.5 tablets (12.5 mg total) by mouth daily. 08/14/13   Chauncey Cruel, MD  temazepam (RESTORIL) 30 MG capsule Take 1 capsule (30 mg total) by mouth at bedtime as needed for sleep. 09/16/14   Marcelino Duster, NP  traMADol (ULTRAM) 50 MG tablet Take 1 tablet (50 mg total) by mouth every 6 (six) hours as needed. 08/06/14   Marcelino Duster, NP  warfarin (COUMADIN) 5 MG tablet Take 5-7.5 mg by mouth See admin instructions. Takes 7.5mg  everyday except 5mg  on Friday's    Historical Provider, MD  zolpidem (AMBIEN) 10 MG tablet  04/22/14   Historical Provider, MD   BP 144/68 mmHg  Pulse 67  Temp(Src) 98.8 F (37.1 C) (Oral)  Resp 17  SpO2 99% Physical Exam  Constitutional: She is oriented to person, place, and time. She appears well-developed and well-nourished.  obese  HENT:  Head: Normocephalic.  Mouth/Throat: Oropharynx is clear and moist. No oropharyngeal exudate.  Eyes: Conjunctivae and EOM are normal. Pupils are equal, round, and reactive to light.  Neck: Normal range of motion. Neck supple.  Cardiovascular: Normal rate, regular rhythm, normal heart sounds and intact distal pulses.  Exam reveals no gallop and no friction rub.   No murmur heard. Pulmonary/Chest: Effort normal and breath sounds normal. No respiratory distress. She has no wheezes.  Abdominal: Soft. Bowel sounds are normal. There is no tenderness. There is no rebound and no guarding.  Musculoskeletal: Normal range of  motion. She exhibits no edema or tenderness.  2+ distal pulses in bilateral lower extremities.  Normal symmetric appearance of bilateral knees.  No focal tenderness of the right knee.  Mild reproduction of pain with range of motion of the right knee.  Neurological: She is alert and oriented to person, place, and time.  Skin: Skin is warm and dry.  Psychiatric: She has a normal mood and affect. Her behavior is normal.  Nursing note and vitals reviewed.   ED Course  Procedures (including critical care time) Labs Review Labs Reviewed - No data to display  Imaging Review Dg Knee Complete 4 Views Right  10/02/2014   CLINICAL DATA:  Pt c/o right  knee pain at patella and "inside joint" since 09/16/14 s/p pt was trying to push herself up from sitting at a table in a local restaurant, and with wt on leg, twisted knee. Pt is a breast cancer pt w/chemoterapy ongoing.  EXAM: RIGHT KNEE - COMPLETE 4+ VIEW  COMPARISON:  None.  FINDINGS: Examination demonstrates moderate tricompartmental osteoarthritic change. There is no definite acute fracture or dislocation. No significant joint effusion.  IMPRESSION: No acute findings.  Moderate osteoarthritis.   Electronically Signed   By: Marin Olp M.D.   On: 10/02/2014 08:42     EKG Interpretation None      MDM   Final diagnoses:  Right knee pain    8:13 AM 64 y.o. female with history of breast cancer with metastasis to the liver and lung on chemotherapy, thrombosis on coumadin who presents with right knee pain since January 21. She notes that she twisted her knee while sitting down at a restaurant. She has had ongoing pain in the knee since that time. She is ambulatory but has pain when bearing weight. She denies any fevers. She has normal range of motion of the knee without evidence of superficial erythema or concern for infection. I suspect this is a musculoskeletal cause of her pain. We'll get plain film and pain control.  9:14 AM: We'll place a  knee immobilizer. I have discussed the diagnosis/risks/treatment options with the patient and believe the pt to be eligible for discharge home to follow-up with her pcp next week to discuss further imaging. We also discussed returning to the ED immediately if new or worsening sx occur. We discussed the sx which are most concerning (e.g., worsening pain, fever) that necessitate immediate return. Medications administered to the patient during their visit and any new prescriptions provided to the patient are listed below.  Medications given during this visit Medications  oxyCODONE-acetaminophen (PERCOCET/ROXICET) 5-325 MG per tablet 1 tablet (1 tablet Oral Given 10/02/14 0848)    New Prescriptions   OXYCODONE (ROXICODONE) 5 MG IMMEDIATE RELEASE TABLET    Take 1-2 tablets every 4-6 hours as needed for pain.     Pamella Pert, MD 10/02/14 [phone]

## 2014-10-02 NOTE — ED Notes (Signed)
Pt from home c/o right knee pain since 01/21 from twisting her knee and reports the pain is not getting better. She reports needing assistance to get up and down stairs and getting  Out of the chair. She is using icy hot and ace wrap without relief.

## 2014-10-02 NOTE — Discharge Instructions (Signed)

## 2014-10-02 NOTE — ED Notes (Signed)
Pt alert, oriented, and ambulated to lobby with steady gait. She was advised to follow up with her PCP in 3 days.

## 2014-10-07 ENCOUNTER — Ambulatory Visit: Payer: Commercial Managed Care - HMO

## 2014-10-07 ENCOUNTER — Other Ambulatory Visit: Payer: Commercial Managed Care - HMO

## 2014-10-07 ENCOUNTER — Telehealth: Payer: Self-pay | Admitting: Pharmacist

## 2014-10-07 NOTE — Telephone Encounter (Signed)
Left vm for pt to call [facility] to sched lab/CC appt.  She missed her appt today with [place]. Kennith Center, Pharm.D., CPP 10/07/2014@4 :53 PM

## 2014-10-16 ENCOUNTER — Other Ambulatory Visit: Payer: Self-pay | Admitting: Oncology

## 2014-10-18 ENCOUNTER — Other Ambulatory Visit: Payer: Self-pay | Admitting: Oncology

## 2014-10-18 ENCOUNTER — Other Ambulatory Visit: Payer: Self-pay | Admitting: Nurse Practitioner

## 2014-10-18 DIAGNOSIS — G629 Polyneuropathy, unspecified: Secondary | ICD-10-CM

## 2014-10-18 DIAGNOSIS — C50919 Malignant neoplasm of unspecified site of unspecified female breast: Secondary | ICD-10-CM

## 2014-10-18 MED ORDER — BACLOFEN 10 MG PO TABS: 10.0000 mg | ORAL_TABLET | Freq: Three times a day (TID) | ORAL | Status: DC | PRN

## 2014-10-20 ENCOUNTER — Other Ambulatory Visit: Payer: Self-pay | Admitting: *Deleted

## 2014-10-20 DIAGNOSIS — C50919 Malignant neoplasm of unspecified site of unspecified female breast: Secondary | ICD-10-CM

## 2014-10-20 DIAGNOSIS — F411 Generalized anxiety disorder: Secondary | ICD-10-CM

## 2014-10-20 MED ORDER — ALPRAZOLAM 1 MG PO TABS: 1.0000 mg | ORAL_TABLET | Freq: Three times a day (TID) | ORAL | Status: DC | PRN

## 2014-10-20 NOTE — Telephone Encounter (Signed)
Braelynne called requesting refill for alprazolam.  This nurse called [facility] with new order.

## 2014-10-21 ENCOUNTER — Other Ambulatory Visit: Payer: Self-pay | Admitting: Nurse Practitioner

## 2014-10-28 ENCOUNTER — Other Ambulatory Visit: Payer: Self-pay | Admitting: Pharmacist

## 2014-10-28 ENCOUNTER — Other Ambulatory Visit (HOSPITAL_BASED_OUTPATIENT_CLINIC_OR_DEPARTMENT_OTHER): Payer: Commercial Managed Care - HMO | Admitting: *Deleted

## 2014-10-28 ENCOUNTER — Ambulatory Visit: Payer: Commercial Managed Care - HMO

## 2014-10-28 ENCOUNTER — Other Ambulatory Visit: Payer: Self-pay | Admitting: *Deleted

## 2014-10-28 ENCOUNTER — Ambulatory Visit (HOSPITAL_BASED_OUTPATIENT_CLINIC_OR_DEPARTMENT_OTHER): Payer: Commercial Managed Care - HMO

## 2014-10-28 ENCOUNTER — Other Ambulatory Visit (HOSPITAL_BASED_OUTPATIENT_CLINIC_OR_DEPARTMENT_OTHER): Payer: Commercial Managed Care - HMO

## 2014-10-28 ENCOUNTER — Other Ambulatory Visit: Payer: Commercial Managed Care - HMO

## 2014-10-28 ENCOUNTER — Ambulatory Visit (HOSPITAL_BASED_OUTPATIENT_CLINIC_OR_DEPARTMENT_OTHER): Payer: Self-pay | Admitting: Pharmacist

## 2014-10-28 DIAGNOSIS — C7951 Secondary malignant neoplasm of bone: Secondary | ICD-10-CM

## 2014-10-28 DIAGNOSIS — C50919 Malignant neoplasm of unspecified site of unspecified female breast: Secondary | ICD-10-CM

## 2014-10-28 DIAGNOSIS — C787 Secondary malignant neoplasm of liver and intrahepatic bile duct: Secondary | ICD-10-CM

## 2014-10-28 DIAGNOSIS — C50811 Malignant neoplasm of overlapping sites of right female breast: Secondary | ICD-10-CM

## 2014-10-28 DIAGNOSIS — Z95828 Presence of other vascular implants and grafts: Secondary | ICD-10-CM

## 2014-10-28 DIAGNOSIS — Z7901 Long term (current) use of anticoagulants: Secondary | ICD-10-CM

## 2014-10-28 DIAGNOSIS — I871 Compression of vein: Secondary | ICD-10-CM

## 2014-10-28 DIAGNOSIS — Z5112 Encounter for antineoplastic immunotherapy: Secondary | ICD-10-CM

## 2014-10-28 DIAGNOSIS — I82409 Acute embolism and thrombosis of unspecified deep veins of unspecified lower extremity: Secondary | ICD-10-CM

## 2014-10-28 LAB — CBC WITH DIFFERENTIAL/PLATELET
BASO%: 0.2 % (ref 0.0–2.0)
BASOS ABS: 0 10*3/uL (ref 0.0–0.1)
EOS%: 2 % (ref 0.0–7.0)
Eosinophils Absolute: 0.2 10*3/uL (ref 0.0–0.5)
HCT: 35.6 % (ref 34.8–46.6)
HEMOGLOBIN: 12.3 g/dL (ref 11.6–15.9)
LYMPH#: 3.9 10*3/uL — AB (ref 0.9–3.3)
LYMPH%: 42.9 % (ref 14.0–49.7)
MCH: 27.3 pg (ref 25.1–34.0)
MCHC: 34.6 g/dL (ref 31.5–36.0)
MCV: 79.1 fL — ABNORMAL LOW (ref 79.5–101.0)
MONO#: 0.6 10*3/uL (ref 0.1–0.9)
MONO%: 6.4 % (ref 0.0–14.0)
NEUT#: 4.4 10*3/uL (ref 1.5–6.5)
NEUT%: 48.5 % (ref 38.4–76.8)
Platelets: 230 10*3/uL (ref 145–400)
RBC: 4.5 10*6/uL (ref 3.70–5.45)
RDW: 16.4 % — AB (ref 11.2–14.5)
WBC: 9 10*3/uL (ref 3.9–10.3)

## 2014-10-28 LAB — COMPREHENSIVE METABOLIC PANEL (CC13)
ALK PHOS: 84 U/L (ref 40–150)
ALT: 9 U/L (ref 0–55)
AST: 14 U/L (ref 5–34)
Albumin: 3.7 g/dL (ref 3.5–5.0)
Anion Gap: 12 mEq/L — ABNORMAL HIGH (ref 3–11)
BILIRUBIN TOTAL: 0.65 mg/dL (ref 0.20–1.20)
BUN: 16.9 mg/dL (ref 7.0–26.0)
CO2: 23 mEq/L (ref 22–29)
CREATININE: 0.8 mg/dL (ref 0.6–1.1)
Calcium: 9.4 mg/dL (ref 8.4–10.4)
Chloride: 108 mEq/L (ref 98–109)
EGFR: 88 mL/min/{1.73_m2} — AB (ref >90)
GLUCOSE: 95 mg/dL (ref 70–140)
Potassium: 3.8 mEq/L (ref 3.5–5.1)
Sodium: 143 mEq/L (ref 136–145)
TOTAL PROTEIN: 8.1 g/dL (ref 6.4–8.3)

## 2014-10-28 LAB — URINALYSIS, MICROSCOPIC - CHCC
Bilirubin (Urine): NEGATIVE
Glucose: NEGATIVE mg/dL
Ketones: NEGATIVE mg/dL
NITRITE: NEGATIVE
SPECIFIC GRAVITY, URINE: 1.02 (ref 1.003–1.035)
UROBILINOGEN UR: 0.2 mg/dL (ref 0.2–1)
pH: 6 (ref 4.6–8.0)

## 2014-10-28 LAB — PROTIME-INR
INR: 1.4 — AB (ref 2.00–3.50)
Protime: 16.8 Seconds — ABNORMAL HIGH (ref 10.6–13.4)

## 2014-10-28 LAB — POCT INR: INR: 1.4

## 2014-10-28 MED ORDER — SODIUM CHLORIDE 0.9 % IV SOLN
Freq: Once | INTRAVENOUS | Status: AC
  Administered 2014-10-28: 10:00:00 via INTRAVENOUS

## 2014-10-28 MED ORDER — SODIUM CHLORIDE 0.9 % IJ SOLN
10.0000 mL | INTRAMUSCULAR | Status: DC | PRN
  Administered 2014-10-28: 10 mL
  Filled 2014-10-28: qty 10

## 2014-10-28 MED ORDER — LORAZEPAM 2 MG/ML IJ SOLN
1.0000 mg | Freq: Once | INTRAMUSCULAR | Status: AC | PRN
  Administered 2014-10-28: 1 mg via INTRAVENOUS

## 2014-10-28 MED ORDER — TRAMADOL HCL 50 MG PO TABS: 50.0000 mg | ORAL_TABLET | Freq: Four times a day (QID) | ORAL | Status: DC | PRN

## 2014-10-28 MED ORDER — SODIUM CHLORIDE 0.9 % IJ SOLN
10.0000 mL | INTRAMUSCULAR | Status: DC | PRN
Start: 2014-10-28 — End: 2014-10-28
  Administered 2014-10-28: 10 mL via INTRAVENOUS
  Filled 2014-10-28: qty 10

## 2014-10-28 MED ORDER — TRASTUZUMAB CHEMO INJECTION 440 MG
798.0000 mg | Freq: Once | INTRAVENOUS | Status: AC
  Administered 2014-10-28: 798 mg via INTRAVENOUS
  Filled 2014-10-28: qty 38

## 2014-10-28 MED ORDER — DIPHENHYDRAMINE HCL 25 MG PO CAPS
50.0000 mg | ORAL_CAPSULE | Freq: Once | ORAL | Status: AC
  Administered 2014-10-28: 50 mg via ORAL

## 2014-10-28 MED ORDER — TEMAZEPAM 30 MG PO CAPS: 30.0000 mg | ORAL_CAPSULE | Freq: Every evening | ORAL | Status: DC | PRN

## 2014-10-28 MED ORDER — ACETAMINOPHEN 325 MG PO TABS
ORAL_TABLET | ORAL | Status: AC
  Filled 2014-10-28: qty 2

## 2014-10-28 MED ORDER — ACETAMINOPHEN 325 MG PO TABS
650.0000 mg | ORAL_TABLET | Freq: Once | ORAL | Status: AC
  Administered 2014-10-28: 650 mg via ORAL

## 2014-10-28 MED ORDER — HEPARIN SOD (PORK) LOCK FLUSH 100 UNIT/ML IV SOLN
500.0000 [IU] | Freq: Once | INTRAVENOUS | Status: AC | PRN
  Administered 2014-10-28: 500 [IU]
  Filled 2014-10-28: qty 5

## 2014-10-28 MED ORDER — DIPHENHYDRAMINE HCL 25 MG PO CAPS
ORAL_CAPSULE | ORAL | Status: AC
  Filled 2014-10-28: qty 2

## 2014-10-28 MED ORDER — LORAZEPAM 2 MG/ML IJ SOLN
INTRAMUSCULAR | Status: AC
  Filled 2014-10-28: qty 1

## 2014-10-28 NOTE — Progress Notes (Addendum)
INR below goal today.  INR elevated at last visit and dose decreased.  No reported bleeding/bruising or med changes.  Will resume previous dose of coumadin 7.5mg  daily but 5mg  on Friday.  Ms [name] INR had been stable on this dose for over 1 year.  Ms [name] also stated that her garden is about to come in which contains vegetables with a high Vitamin K content.  Will call in refill for coumadin 5mg  to [facility] in [place].  Ms [name] will break the 5mg  tablet in 1/2 to = 7.5mg .  Will check PT/INR in 3 weeks with next Herceptin infusion. I called [facility] and pt just picked up 90 day supply of warfarin.  I did not call in refill.

## 2014-10-28 NOTE — Patient Instructions (Signed)

## 2014-10-28 NOTE — Patient Instructions (Signed)
[facility] Discharge Instructions for Patients Receiving Chemotherapy  Today you received the following chemotherapy agents Herceptin.  To help prevent nausea and vomiting after your treatment, we encourage you to take your nausea medication as prescribed.   If you develop nausea and vomiting that is not controlled by your nausea medication, call the clinic.   BELOW ARE SYMPTOMS THAT SHOULD BE REPORTED IMMEDIATELY:  *FEVER GREATER THAN 100.5 F  *CHILLS WITH OR WITHOUT FEVER  NAUSEA AND VOMITING THAT IS NOT CONTROLLED WITH YOUR NAUSEA MEDICATION  *UNUSUAL SHORTNESS OF BREATH  *UNUSUAL BRUISING OR BLEEDING  TENDERNESS IN MOUTH AND THROAT WITH OR WITHOUT PRESENCE OF ULCERS  *URINARY PROBLEMS  *BOWEL PROBLEMS  UNUSUAL RASH Items with * indicate a potential emergency and should be followed up as soon as possible.  Feel free to call the clinic you have any questions or concerns. The clinic phone number is [phone].    

## 2014-10-28 NOTE — Progress Notes (Signed)
Patient presents to clinic with temp 99.0, and reports no chills. Obtain a UA, and proceed with Herceptin per Dr. Jana Hakim.

## 2014-10-29 LAB — URINE CULTURE

## 2014-11-01 ENCOUNTER — Other Ambulatory Visit: Payer: Self-pay | Admitting: Oncology

## 2014-11-02 ENCOUNTER — Other Ambulatory Visit: Payer: Self-pay | Admitting: *Deleted

## 2014-11-02 MED ORDER — CEPHALEXIN 500 MG PO CAPS: 500.0000 mg | ORAL_CAPSULE | Freq: Four times a day (QID) | ORAL | Status: DC

## 2014-11-08 ENCOUNTER — Other Ambulatory Visit: Payer: Self-pay | Admitting: Oncology

## 2014-11-08 NOTE — Telephone Encounter (Signed)
Last 1/21 Next 4/25  Chart reviewed

## 2014-11-18 ENCOUNTER — Ambulatory Visit (HOSPITAL_BASED_OUTPATIENT_CLINIC_OR_DEPARTMENT_OTHER): Payer: Commercial Managed Care - HMO

## 2014-11-18 ENCOUNTER — Ambulatory Visit: Payer: Commercial Managed Care - HMO

## 2014-11-18 ENCOUNTER — Ambulatory Visit: Payer: Commercial Managed Care - HMO | Admitting: Pharmacist

## 2014-11-18 ENCOUNTER — Telehealth: Payer: Self-pay | Admitting: Oncology

## 2014-11-18 ENCOUNTER — Other Ambulatory Visit (HOSPITAL_BASED_OUTPATIENT_CLINIC_OR_DEPARTMENT_OTHER): Payer: Commercial Managed Care - HMO

## 2014-11-18 DIAGNOSIS — C50819 Malignant neoplasm of overlapping sites of unspecified female breast: Secondary | ICD-10-CM | POA: Diagnosis not present

## 2014-11-18 DIAGNOSIS — Z5112 Encounter for antineoplastic immunotherapy: Secondary | ICD-10-CM

## 2014-11-18 DIAGNOSIS — C50919 Malignant neoplasm of unspecified site of unspecified female breast: Secondary | ICD-10-CM

## 2014-11-18 DIAGNOSIS — C787 Secondary malignant neoplasm of liver and intrahepatic bile duct: Secondary | ICD-10-CM | POA: Diagnosis not present

## 2014-11-18 DIAGNOSIS — C7951 Secondary malignant neoplasm of bone: Secondary | ICD-10-CM | POA: Diagnosis not present

## 2014-11-18 DIAGNOSIS — I82409 Acute embolism and thrombosis of unspecified deep veins of unspecified lower extremity: Secondary | ICD-10-CM

## 2014-11-18 DIAGNOSIS — I871 Compression of vein: Secondary | ICD-10-CM | POA: Diagnosis not present

## 2014-11-18 DIAGNOSIS — Z95828 Presence of other vascular implants and grafts: Secondary | ICD-10-CM

## 2014-11-18 LAB — CBC WITH DIFFERENTIAL/PLATELET
BASO%: 0.2 % (ref 0.0–2.0)
BASOS ABS: 0 10*3/uL (ref 0.0–0.1)
EOS%: 2.1 % (ref 0.0–7.0)
Eosinophils Absolute: 0.2 10*3/uL (ref 0.0–0.5)
HEMATOCRIT: 34.4 % — AB (ref 34.8–46.6)
HGB: 11.9 g/dL (ref 11.6–15.9)
LYMPH#: 3 10*3/uL (ref 0.9–3.3)
LYMPH%: 35.4 % (ref 14.0–49.7)
MCH: 27.3 pg (ref 25.1–34.0)
MCHC: 34.6 g/dL (ref 31.5–36.0)
MCV: 78.9 fL — AB (ref 79.5–101.0)
MONO#: 0.7 10*3/uL (ref 0.1–0.9)
MONO%: 8.7 % (ref 0.0–14.0)
NEUT#: 4.6 10*3/uL (ref 1.5–6.5)
NEUT%: 53.6 % (ref 38.4–76.8)
Platelets: 244 10*3/uL (ref 145–400)
RBC: 4.36 10*6/uL (ref 3.70–5.45)
RDW: 16.1 % — ABNORMAL HIGH (ref 11.2–14.5)
WBC: 8.5 10*3/uL (ref 3.9–10.3)

## 2014-11-18 LAB — COMPREHENSIVE METABOLIC PANEL (CC13)
ALBUMIN: 3.5 g/dL (ref 3.5–5.0)
ALK PHOS: 82 U/L (ref 40–150)
ALT: 14 U/L (ref 0–55)
ANION GAP: 9 meq/L (ref 3–11)
AST: 16 U/L (ref 5–34)
BILIRUBIN TOTAL: 0.71 mg/dL (ref 0.20–1.20)
BUN: 13.5 mg/dL (ref 7.0–26.0)
CO2: 25 mEq/L (ref 22–29)
Calcium: 9.6 mg/dL (ref 8.4–10.4)
Chloride: 106 mEq/L (ref 98–109)
Creatinine: 0.8 mg/dL (ref 0.6–1.1)
EGFR: >90 mL/min/{1.73_m2} (ref >90)
Glucose: 99 mg/dl (ref 70–140)
POTASSIUM: 4.2 meq/L (ref 3.5–5.1)
SODIUM: 140 meq/L (ref 136–145)
TOTAL PROTEIN: 8.1 g/dL (ref 6.4–8.3)

## 2014-11-18 LAB — PROTIME-INR
INR: 3.3 (ref 2.00–3.50)
Protime: 39.6 Seconds — ABNORMAL HIGH (ref 10.6–13.4)

## 2014-11-18 LAB — POCT INR: INR: 3.3

## 2014-11-18 MED ORDER — LORAZEPAM 2 MG/ML IJ SOLN
INTRAMUSCULAR | Status: AC
  Filled 2014-11-18: qty 1

## 2014-11-18 MED ORDER — SODIUM CHLORIDE 0.9 % IV SOLN
Freq: Once | INTRAVENOUS | Status: AC
  Administered 2014-11-18: 10:00:00 via INTRAVENOUS

## 2014-11-18 MED ORDER — DIPHENHYDRAMINE HCL 25 MG PO CAPS
ORAL_CAPSULE | ORAL | Status: AC
  Filled 2014-11-18: qty 2

## 2014-11-18 MED ORDER — SODIUM CHLORIDE 0.9 % IJ SOLN
10.0000 mL | INTRAMUSCULAR | Status: DC | PRN
  Administered 2014-11-18: 10 mL
  Filled 2014-11-18: qty 10

## 2014-11-18 MED ORDER — HEPARIN SOD (PORK) LOCK FLUSH 100 UNIT/ML IV SOLN
500.0000 [IU] | Freq: Once | INTRAVENOUS | Status: AC | PRN
  Administered 2014-11-18: 500 [IU]
  Filled 2014-11-18: qty 5

## 2014-11-18 MED ORDER — ACETAMINOPHEN 325 MG PO TABS
650.0000 mg | ORAL_TABLET | Freq: Once | ORAL | Status: AC
  Administered 2014-11-18: 650 mg via ORAL

## 2014-11-18 MED ORDER — SODIUM CHLORIDE 0.9 % IJ SOLN
10.0000 mL | INTRAMUSCULAR | Status: DC | PRN
  Administered 2014-11-18: 10 mL via INTRAVENOUS
  Filled 2014-11-18: qty 10

## 2014-11-18 MED ORDER — LORAZEPAM 2 MG/ML IJ SOLN
1.0000 mg | Freq: Once | INTRAMUSCULAR | Status: AC | PRN
  Administered 2014-11-18: 1 mg via INTRAVENOUS

## 2014-11-18 MED ORDER — TRASTUZUMAB CHEMO INJECTION 440 MG
6.0000 mg/kg | Freq: Once | INTRAVENOUS | Status: AC
  Administered 2014-11-18: 798 mg via INTRAVENOUS
  Filled 2014-11-18: qty 38

## 2014-11-18 MED ORDER — ACETAMINOPHEN 325 MG PO TABS
ORAL_TABLET | ORAL | Status: AC
  Filled 2014-11-18: qty 2

## 2014-11-18 MED ORDER — TRASTUZUMAB CHEMO INJECTION 440 MG: 6.0000 mg/kg | Freq: Once | INTRAVENOUS | Status: DC

## 2014-11-18 MED ORDER — DIPHENHYDRAMINE HCL 25 MG PO CAPS
50.0000 mg | ORAL_CAPSULE | Freq: Once | ORAL | Status: AC
  Administered 2014-11-18: 50 mg via ORAL

## 2014-11-18 NOTE — Patient Instructions (Signed)
[facility] Discharge Instructions for Patients Receiving Chemotherapy  Today you received the following chemotherapy agents:  Herceptin  To help prevent nausea and vomiting after your treatment, we encourage you to take your nausea medication as prescribed.   If you develop nausea and vomiting that is not controlled by your nausea medication, call the clinic.   BELOW ARE SYMPTOMS THAT SHOULD BE REPORTED IMMEDIATELY:  *FEVER GREATER THAN 100.5 F  *CHILLS WITH OR WITHOUT FEVER  NAUSEA AND VOMITING THAT IS NOT CONTROLLED WITH YOUR NAUSEA MEDICATION  *UNUSUAL SHORTNESS OF BREATH  *UNUSUAL BRUISING OR BLEEDING  TENDERNESS IN MOUTH AND THROAT WITH OR WITHOUT PRESENCE OF ULCERS  *URINARY PROBLEMS  *BOWEL PROBLEMS  UNUSUAL RASH Items with * indicate a potential emergency and should be followed up as soon as possible.  Feel free to call the clinic you have any questions or concerns. The clinic phone number is [phone].  Please show the CHEMO ALERT CARD at check-in to the Emergency Department and triage nurse.   

## 2014-11-18 NOTE — Patient Instructions (Signed)

## 2014-11-18 NOTE — Patient Instructions (Signed)
INR right at goal No changes Continue coumadin dose of 7.5mg  daily and 5mg  on Friday.   Will check PT/INR in 3 weeks on 12/09/14 and will see you in infusion.

## 2014-11-18 NOTE — Telephone Encounter (Signed)
per Chris Phar to sch CC-pt aware °

## 2014-11-18 NOTE — Progress Notes (Signed)
INR right at goal today at 3.3 (goal 2-3) Pt is doing well with no complaints Seen in infusion area during herceptin treatment No missed or extra doses No diet or medication changes Pt is getting ready to plant more in her garden. Will watch for fluctuations in INR due to changing amounts of vitamin K in her diet No unusual bleeding or bruising Plan: No changes Continue coumadin dose of 7.5mg  daily and 5mg  on Friday.   Will check PT/INR in 3 weeks on 12/09/14 and will see you in infusion.

## 2014-12-09 ENCOUNTER — Ambulatory Visit: Payer: Commercial Managed Care - HMO

## 2014-12-09 ENCOUNTER — Other Ambulatory Visit: Payer: Self-pay | Admitting: Oncology

## 2014-12-09 ENCOUNTER — Ambulatory Visit (HOSPITAL_BASED_OUTPATIENT_CLINIC_OR_DEPARTMENT_OTHER): Payer: Commercial Managed Care - HMO

## 2014-12-09 ENCOUNTER — Telehealth: Payer: Self-pay | Admitting: Oncology

## 2014-12-09 ENCOUNTER — Other Ambulatory Visit (HOSPITAL_COMMUNITY)
Admission: RE | Admit: 2014-12-09 | Discharge: 2014-12-09 | Disposition: A | Discharge status: Home / Self Care | Payer: Commercial Managed Care - HMO | Source: Ambulatory Visit | Attending: Oncology | Admitting: Oncology

## 2014-12-09 ENCOUNTER — Other Ambulatory Visit (HOSPITAL_BASED_OUTPATIENT_CLINIC_OR_DEPARTMENT_OTHER): Payer: Commercial Managed Care - HMO

## 2014-12-09 ENCOUNTER — Ambulatory Visit (HOSPITAL_BASED_OUTPATIENT_CLINIC_OR_DEPARTMENT_OTHER): Payer: Self-pay | Admitting: Pharmacist

## 2014-12-09 VITALS — BP 122/84 | HR 74 | Temp 98.5°F | Wt 301.8 lb

## 2014-12-09 DIAGNOSIS — C787 Secondary malignant neoplasm of liver and intrahepatic bile duct: Secondary | ICD-10-CM | POA: Diagnosis not present

## 2014-12-09 DIAGNOSIS — C50811 Malignant neoplasm of overlapping sites of right female breast: Secondary | ICD-10-CM | POA: Diagnosis not present

## 2014-12-09 DIAGNOSIS — Z5112 Encounter for antineoplastic immunotherapy: Secondary | ICD-10-CM | POA: Diagnosis not present

## 2014-12-09 DIAGNOSIS — Z7901 Long term (current) use of anticoagulants: Secondary | ICD-10-CM | POA: Diagnosis not present

## 2014-12-09 DIAGNOSIS — Z95828 Presence of other vascular implants and grafts: Secondary | ICD-10-CM

## 2014-12-09 DIAGNOSIS — C50919 Malignant neoplasm of unspecified site of unspecified female breast: Secondary | ICD-10-CM | POA: Diagnosis present

## 2014-12-09 DIAGNOSIS — I82409 Acute embolism and thrombosis of unspecified deep veins of unspecified lower extremity: Secondary | ICD-10-CM

## 2014-12-09 LAB — COMPREHENSIVE METABOLIC PANEL
ALBUMIN: 3.7 g/dL (ref 3.5–5.2)
ALK PHOS: 94 U/L (ref 39–117)
ALT: 20 U/L (ref 0–35)
ANION GAP: 7 (ref 5–15)
AST: 20 U/L (ref 0–37)
BUN: 16 mg/dL (ref 6–23)
CHLORIDE: 107 mmol/L (ref 96–112)
CO2: 25 mmol/L (ref 19–32)
Calcium: 8.8 mg/dL (ref 8.4–10.5)
Creatinine, Ser: 0.75 mg/dL (ref 0.50–1.10)
GFR calc non Af Amer: 88 mL/min — ABNORMAL LOW (ref >90)
Glucose, Bld: 101 mg/dL — ABNORMAL HIGH (ref 70–99)
Potassium: 3.9 mmol/L (ref 3.5–5.1)
SODIUM: 139 mmol/L (ref 135–145)
TOTAL PROTEIN: 8.2 g/dL (ref 6.0–8.3)
Total Bilirubin: 0.4 mg/dL (ref 0.3–1.2)

## 2014-12-09 LAB — CBC WITH DIFFERENTIAL/PLATELET
BASO%: 1.5 % (ref 0.0–2.0)
Basophils Absolute: 0.2 10*3/uL — ABNORMAL HIGH (ref 0.0–0.1)
EOS%: 1.7 % (ref 0.0–7.0)
Eosinophils Absolute: 0.2 10*3/uL (ref 0.0–0.5)
HCT: 35.1 % (ref 34.8–46.6)
HGB: 11.6 g/dL (ref 11.6–15.9)
LYMPH%: 29.1 % (ref 14.0–49.7)
MCH: 26.7 pg (ref 25.1–34.0)
MCHC: 33.1 g/dL (ref 31.5–36.0)
MCV: 80.8 fL (ref 79.5–101.0)
MONO#: 0.8 10*3/uL (ref 0.1–0.9)
MONO%: 7.4 % (ref 0.0–14.0)
NEUT#: 6.1 10*3/uL (ref 1.5–6.5)
NEUT%: 60.3 % (ref 38.4–76.8)
PLATELETS: 251 10*3/uL (ref 145–400)
RBC: 4.34 10*6/uL (ref 3.70–5.45)
RDW: 16.3 % — ABNORMAL HIGH (ref 11.2–14.5)
WBC: 10.2 10*3/uL (ref 3.9–10.3)
lymph#: 3 10*3/uL (ref 0.9–3.3)

## 2014-12-09 LAB — PROTIME-INR
INR: 1.7 — ABNORMAL LOW (ref 2.00–3.50)
Protime: 20.4 Seconds — ABNORMAL HIGH (ref 10.6–13.4)

## 2014-12-09 LAB — POCT INR: INR: 1.7

## 2014-12-09 MED ORDER — HEPARIN SOD (PORK) LOCK FLUSH 100 UNIT/ML IV SOLN
500.0000 [IU] | Freq: Once | INTRAVENOUS | Status: AC | PRN
  Administered 2014-12-09: 500 [IU]
  Filled 2014-12-09: qty 5

## 2014-12-09 MED ORDER — LORAZEPAM 2 MG/ML IJ SOLN
1.0000 mg | Freq: Once | INTRAMUSCULAR | Status: AC | PRN
  Administered 2014-12-09: 1 mg via INTRAVENOUS

## 2014-12-09 MED ORDER — LORAZEPAM 2 MG/ML IJ SOLN
INTRAMUSCULAR | Status: AC
Start: 2014-12-09 — End: 2014-12-09
  Filled 2014-12-09: qty 1

## 2014-12-09 MED ORDER — TRASTUZUMAB CHEMO INJECTION 440 MG
6.0000 mg/kg | Freq: Once | INTRAVENOUS | Status: AC
  Administered 2014-12-09: 798 mg via INTRAVENOUS
  Filled 2014-12-09: qty 38

## 2014-12-09 MED ORDER — SODIUM CHLORIDE 0.9 % IJ SOLN
10.0000 mL | INTRAMUSCULAR | Status: DC | PRN
  Administered 2014-12-09: 10 mL
  Filled 2014-12-09: qty 10

## 2014-12-09 MED ORDER — DIPHENHYDRAMINE HCL 25 MG PO CAPS
ORAL_CAPSULE | ORAL | Status: AC
  Filled 2014-12-09: qty 1

## 2014-12-09 MED ORDER — SODIUM CHLORIDE 0.9 % IV SOLN
Freq: Once | INTRAVENOUS | Status: AC
  Administered 2014-12-09: 11:00:00 via INTRAVENOUS

## 2014-12-09 MED ORDER — LORAZEPAM 1 MG PO TABS
ORAL_TABLET | ORAL | Status: AC
  Filled 2014-12-09: qty 1

## 2014-12-09 MED ORDER — ACETAMINOPHEN 325 MG PO TABS
650.0000 mg | ORAL_TABLET | Freq: Once | ORAL | Status: AC
Start: 2014-12-09 — End: 2014-12-09
  Administered 2014-12-09: 650 mg via ORAL

## 2014-12-09 MED ORDER — ACETAMINOPHEN 325 MG PO TABS
ORAL_TABLET | ORAL | Status: AC
Start: 2014-12-09 — End: 2014-12-09
  Filled 2014-12-09: qty 2

## 2014-12-09 MED ORDER — DIPHENHYDRAMINE HCL 25 MG PO CAPS
50.0000 mg | ORAL_CAPSULE | Freq: Once | ORAL | Status: AC
  Administered 2014-12-09: 50 mg via ORAL

## 2014-12-09 MED ORDER — SODIUM CHLORIDE 0.9 % IJ SOLN
10.0000 mL | INTRAMUSCULAR | Status: DC | PRN
  Administered 2014-12-09: 10 mL via INTRAVENOUS
  Filled 2014-12-09: qty 10

## 2014-12-09 NOTE — Patient Instructions (Addendum)
INR slightly below goal at 1.7 (Goal 2-3)   Plan: Increase dose slightly to coumadin dose of 7.5mg  daily .   Will check PT/INR in 2 weeks on 4/25/16with other office visits with Dr. Jana Hakim.  Lab 10:15, flush at 10:30am, Coumadin clinic at 10:45am and MD visit at 11am

## 2014-12-09 NOTE — Telephone Encounter (Signed)
per Gerald Stabs in [place] to sch CC-pt aware

## 2014-12-09 NOTE — Patient Instructions (Signed)

## 2014-12-09 NOTE — Progress Notes (Signed)
INR slightly below goal at 1.7 (Goal 2-3) Pt seen in infusion area  Pt is doing well with no complaints No missed or extra doses No unusual bleeding or bruising No medication changes Pt states she has been "eating out" more This might effect the INR slightly  Plan: Increase dose slightly to coumadin dose of 7.5mg  daily .   Will check PT/INR in 2 weeks on 4/25/16with other office visits with Dr. Jana Hakim.  Lab 10:15, flush at 10:30am, Coumadin clinic at 10:45am and MD visit at 11am

## 2014-12-09 NOTE — Patient Instructions (Signed)
[facility] Discharge Instructions for Patients Receiving Chemotherapy  Today you received the following chemotherapy agents:  Herceptin  To help prevent nausea and vomiting after your treatment, we encourage you to take your nausea medication as prescribed.   If you develop nausea and vomiting that is not controlled by your nausea medication, call the clinic.   BELOW ARE SYMPTOMS THAT SHOULD BE REPORTED IMMEDIATELY:  *FEVER GREATER THAN 100.5 F  *CHILLS WITH OR WITHOUT FEVER  NAUSEA AND VOMITING THAT IS NOT CONTROLLED WITH YOUR NAUSEA MEDICATION  *UNUSUAL SHORTNESS OF BREATH  *UNUSUAL BRUISING OR BLEEDING  TENDERNESS IN MOUTH AND THROAT WITH OR WITHOUT PRESENCE OF ULCERS  *URINARY PROBLEMS  *BOWEL PROBLEMS  UNUSUAL RASH Items with * indicate a potential emergency and should be followed up as soon as possible.  Feel free to call the clinic you have any questions or concerns. The clinic phone number is [phone].  Please show the CHEMO ALERT CARD at check-in to the Emergency Department and triage nurse.   

## 2014-12-16 ENCOUNTER — Ambulatory Visit (HOSPITAL_COMMUNITY)
Admission: RE | Admit: 2014-12-16 | Discharge: 2014-12-16 | Disposition: A | Discharge status: Home / Self Care | Payer: Commercial Managed Care - HMO | Source: Ambulatory Visit | Attending: Nurse Practitioner | Admitting: Nurse Practitioner

## 2014-12-16 DIAGNOSIS — C50919 Malignant neoplasm of unspecified site of unspecified female breast: Secondary | ICD-10-CM | POA: Insufficient documentation

## 2014-12-16 DIAGNOSIS — C799 Secondary malignant neoplasm of unspecified site: Secondary | ICD-10-CM | POA: Diagnosis not present

## 2014-12-16 LAB — GLUCOSE, CAPILLARY: Glucose-Capillary: 97 mg/dL (ref 70–99)

## 2014-12-16 MED ORDER — FLUDEOXYGLUCOSE F - 18 (FDG) INJECTION: 15.0300 | Freq: Once | INTRAVENOUS | Status: AC | PRN

## 2014-12-16 MED ORDER — FLUDEOXYGLUCOSE F - 18 (FDG) INJECTION
15.0200 | Freq: Once | INTRAVENOUS | Status: AC | PRN
  Administered 2014-12-16: 15.02 via INTRAVENOUS

## 2014-12-20 ENCOUNTER — Other Ambulatory Visit: Payer: Commercial Managed Care - HMO

## 2014-12-20 ENCOUNTER — Telehealth: Payer: Self-pay | Admitting: Nurse Practitioner

## 2014-12-20 ENCOUNTER — Other Ambulatory Visit (HOSPITAL_BASED_OUTPATIENT_CLINIC_OR_DEPARTMENT_OTHER): Payer: Commercial Managed Care - HMO

## 2014-12-20 ENCOUNTER — Ambulatory Visit (HOSPITAL_BASED_OUTPATIENT_CLINIC_OR_DEPARTMENT_OTHER): Payer: Self-pay

## 2014-12-20 ENCOUNTER — Ambulatory Visit (HOSPITAL_BASED_OUTPATIENT_CLINIC_OR_DEPARTMENT_OTHER): Payer: Commercial Managed Care - HMO | Admitting: Oncology

## 2014-12-20 ENCOUNTER — Ambulatory Visit (HOSPITAL_BASED_OUTPATIENT_CLINIC_OR_DEPARTMENT_OTHER): Payer: Commercial Managed Care - HMO

## 2014-12-20 VITALS — BP 172/95 | HR 71 | Temp 98.3°F | Resp 18 | Ht 63.0 in | Wt 298.7 lb

## 2014-12-20 DIAGNOSIS — C50911 Malignant neoplasm of unspecified site of right female breast: Secondary | ICD-10-CM

## 2014-12-20 DIAGNOSIS — C7951 Secondary malignant neoplasm of bone: Secondary | ICD-10-CM

## 2014-12-20 DIAGNOSIS — Z7901 Long term (current) use of anticoagulants: Secondary | ICD-10-CM

## 2014-12-20 DIAGNOSIS — Z452 Encounter for adjustment and management of vascular access device: Secondary | ICD-10-CM

## 2014-12-20 DIAGNOSIS — C50912 Malignant neoplasm of unspecified site of left female breast: Secondary | ICD-10-CM | POA: Diagnosis not present

## 2014-12-20 DIAGNOSIS — Z95828 Presence of other vascular implants and grafts: Secondary | ICD-10-CM

## 2014-12-20 DIAGNOSIS — C787 Secondary malignant neoplasm of liver and intrahepatic bile duct: Secondary | ICD-10-CM

## 2014-12-20 DIAGNOSIS — C50919 Malignant neoplasm of unspecified site of unspecified female breast: Secondary | ICD-10-CM

## 2014-12-20 DIAGNOSIS — I82409 Acute embolism and thrombosis of unspecified deep veins of unspecified lower extremity: Secondary | ICD-10-CM

## 2014-12-20 LAB — COMPREHENSIVE METABOLIC PANEL (CC13)
ALK PHOS: 92 U/L (ref 40–150)
ALT: 13 U/L (ref 0–55)
ANION GAP: 15 meq/L — AB (ref 3–11)
AST: 14 U/L (ref 5–34)
Albumin: 3.3 g/dL — ABNORMAL LOW (ref 3.5–5.0)
BUN: 17 mg/dL (ref 7.0–26.0)
CHLORIDE: 106 meq/L (ref 98–109)
CO2: 20 mEq/L — ABNORMAL LOW (ref 22–29)
CREATININE: 0.9 mg/dL (ref 0.6–1.1)
Calcium: 9.3 mg/dL (ref 8.4–10.4)
EGFR: 84 mL/min/{1.73_m2} — ABNORMAL LOW (ref >90)
Glucose: 125 mg/dl (ref 70–140)
POTASSIUM: 3.9 meq/L (ref 3.5–5.1)
Sodium: 141 mEq/L (ref 136–145)
TOTAL PROTEIN: 7.8 g/dL (ref 6.4–8.3)
Total Bilirubin: 0.28 mg/dL (ref 0.20–1.20)

## 2014-12-20 LAB — CBC WITH DIFFERENTIAL/PLATELET
BASO%: 0.3 % (ref 0.0–2.0)
Basophils Absolute: 0 10*3/uL (ref 0.0–0.1)
EOS%: 2.1 % (ref 0.0–7.0)
Eosinophils Absolute: 0.2 10*3/uL (ref 0.0–0.5)
HCT: 33.4 % — ABNORMAL LOW (ref 34.8–46.6)
HGB: 11.6 g/dL (ref 11.6–15.9)
LYMPH#: 3.3 10*3/uL (ref 0.9–3.3)
LYMPH%: 36.4 % (ref 14.0–49.7)
MCH: 27.4 pg (ref 25.1–34.0)
MCHC: 34.7 g/dL (ref 31.5–36.0)
MCV: 79 fL — ABNORMAL LOW (ref 79.5–101.0)
MONO#: 0.5 10*3/uL (ref 0.1–0.9)
MONO%: 5.7 % (ref 0.0–14.0)
NEUT#: 5 10*3/uL (ref 1.5–6.5)
NEUT%: 55.5 % (ref 38.4–76.8)
PLATELETS: 265 10*3/uL (ref 145–400)
RBC: 4.23 10*6/uL (ref 3.70–5.45)
RDW: 15.9 % — ABNORMAL HIGH (ref 11.2–14.5)
WBC: 9 10*3/uL (ref 3.9–10.3)

## 2014-12-20 LAB — PROTIME-INR
INR: 3.2 (ref 2.00–3.50)
Protime: 38.4 Seconds — ABNORMAL HIGH (ref 10.6–13.4)

## 2014-12-20 LAB — POCT INR: INR: 3.2

## 2014-12-20 MED ORDER — TRAMADOL HCL 50 MG PO TABS: 100.0000 mg | ORAL_TABLET | Freq: Four times a day (QID) | ORAL | Status: DC | PRN

## 2014-12-20 MED ORDER — HEPARIN SOD (PORK) LOCK FLUSH 100 UNIT/ML IV SOLN
500.0000 [IU] | Freq: Once | INTRAVENOUS | Status: AC
  Administered 2014-12-20: 500 [IU] via INTRAVENOUS
  Filled 2014-12-20: qty 5

## 2014-12-20 MED ORDER — SODIUM CHLORIDE 0.9 % IJ SOLN
10.0000 mL | INTRAMUSCULAR | Status: DC | PRN
  Administered 2014-12-20: 10 mL via INTRAVENOUS
  Filled 2014-12-20: qty 10

## 2014-12-20 NOTE — Telephone Encounter (Signed)
appointments made and avs printed for patient,email to [facility] for echo precert

## 2014-12-20 NOTE — Progress Notes (Signed)
Ms. Batdorf INR is 3.2 which is slightly above her goal range of 2-3. She has been taking 7.5 mg daily, which was a dose increase from her last visit when her INR was 1.7.  She denies any missed and/or extra doses. No recent medication changes. She reports eating more in general, and did have broccoli salad yesterday.  No bleeding and/or unusual bruising reported.  Given that her INR almost doubled from her previous visit after a slight dose increase despite her additional vitamin K intake, we will slightly decrease her dose and check her sooner.  Plan: Take 7.5 mg daily, except 5 mg on Mondays. Return to clinic at her next infusion appointment (which is anticipated to be 12/30/14, though appointments will be made after her MD appointment this morning.)

## 2014-12-20 NOTE — Progress Notes (Signed)
[place]  [phone]) [phone] [phone]  OFFICE PROGRESS NOTE  ID: SWETA HALSETH   DOB: Apr 04, 1951  MR#: [id number]  HFW#:[id number]  PCP: Ernestene Kiel, MD GYN:  SU:  OTHER MD: Pierre Bali, Johnnette Gourd, Wendee Copp  CC: Metastatic HER-2 positive breast cancer.  CURRENT TREATMENT: Trastuzumab  BREAST CANCER HISTORY:   From the earlier summary:  Tahisha Hakim is 64 years old [name] female.  This woman has been in good health all of her life.  She noted a swelling and discomfort in her right breast in June 2004.  She was seen in the Emergency Room in [place] and was treated for mastitis.  She was treated for a number of months with mastitis and the swelling did not get better. She was given hydrocodone and Cipro.  Finally, the swelling did get better and ultimately the nipple became retracted and she noticed some dimpling in her skin. She had a mammogram in July of 2004 in [place] with subsequent mammogram on May 27, 2003, by Dr. Isaiah Blakes.  Mammogram done on September 30 showed marked increased density in the left breast.  Biopsy was performed the same day.  It was noted at the 12 o'clock position, deep in the breast was a focal hypoechoic mass, at least 3.5 cm in diameter.  Biopsy did in fact show invasive in situ mammary carcinoma. This was felt to be both at least intermediate, high grade.  No definite lymphovascular invasion was identified. ER and PR negative, Her2 testing positive. Fiza continues to have pain in her breast.  She continues to take hydrocodone a number of times a day.  She has been seen by Dr. Rosana Hoes, who felt that neoadjuvant chemotherapy would be required.    Initial staging studies showed evidence of liver and lung mets.   Patient also has evidence of bone lesions. Patient started neoadjuvant chemotherapy, Taxotere/Carbo/Herceptin in October 2004.   Patient had a CT scan in December 2004 which  demonstrated extensive clot in the SVC innominate vein, bilateral jugular vein and  She was started on anticoagulation therapy. She received a total of 6 cycles of Taxotere/Carbo/Herceptin, completed in April 2005."  Patient has been on  trastuzumab continued indefinitely; has also received lapatinib and capecitabine for variable intervals in 2007-2008. Most recent echo 12/01/2013 showed an ejection fraction of 55%. She is status post bilateral mastectomies with bilateral axillary lymph node dissection 12/07/2004, showing (a) on the right, a mypT1c ypN1 invasive ductal carcinoma, grade 3, estrogen and progesterone receptor negative, HER-2 positive, with an MIB-1 of 31% (b) on the left, ypT2 ypN1 invasive ductal carcinoma, grade 2, estrogen and progesterone receptor negative, HER-2 positive, with an MIB-1 of 35%. She is Status post radiation June through July of 2006, to the right chest wall, left chest wall, bilateral supraclavicular fossae, and bilateral axillary boosts; with additional radiation to the right and left chest walls and the central chest wall completed November of 2007. She is status post ixempra x9 completed August of 2009. 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 Patient has chronic pain, with negative PET scan 08/24/2013 (no evidence of active cancer). On Neurontin and Tramadol therapy.  Her subsequent history is as detailed below  INTERVAL HISTORY: Yisell returns today for followup of her stage IV breast cancer accompanied by her husband, Abe People. She continues on trastuzumab every 3 weeks. She is tolerating this with no side effects that she  is aware of. Her most recent echocardiogram in February showed a well preserved ejection fraction   REVIEW OF SYSTEMS: Alleyah is a little tired because her daughter and her family have moved in with her until her daughter's house is built. There is no specific  terminal date. This includes a 63 year old an 22-year-old who appeared to be very healthy and participated in multiple school activities, with low that and her husband being the main choffeurs. Is having more bony discomfort and wonders if we should go back to oxycodone. She feels the gabapentin and tramadol really are not doing at. Aside from these issues a detailed review of systems today was stable  PAST MEDICAL HISTORY: Past Medical History  Diagnosis Date  . Breast cancer     mets to liver and lung  . Hypertension   . SVC syndrome   . History of chemotherapy Feb. 2006    taxotere/herceptin/carboplatin  . Radiation 07/31/2006    left upper chest  . Radiation 06/17/2006-06/27/2006    6480 cGy bilat. chest wall  . Neuropathy   . Thrombosis   . Breast cancer metastasized to multiple sites 02/26/2013    PAST SURGICAL HISTORY: Past Surgical History  Procedure Laterality Date  . Tubal ligation  1986  . Cholecystectomy  1989  . Mastectomy Bilateral   . Ankle surgery    . Peripherally inserted central catheter insertion    . Back surgery      FAMILY HISTORY Family History  Problem Relation Age of Onset  . Heart failure Father   . Cancer Father     Prostate cancer  . Heart failure Brother   . Cancer Brother     Prostate cancer  . Diabetes Maternal Aunt    She had three brothers, one died of gunshot wound, one of complications of diabetes mellitus and one of myocardial infarction.  She has no sisters.  Mother died of complications of brain metastasis in 61.  Father has had a myocardial infarction in 1999.  No history of breast or ovarian cancer in the family.     GYNECOLOGIC HISTORY:   Menarche at age 37.  Gravida 3, para 3.  First live birth at age 63.  No history of breast feeding. No history of hormonal replacement therapy.   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 Her husband used to work as a Magazine features editor, but is now retired. She has three children, Monette who lives in [place] and works as a Hydrographic surveyor, Financial risk analyst who lives in [place] and works as a Administrator, and Pine Grove who lives in [place] and also works as a Hydrographic surveyor. The patient has 12 grandchildren and 4 great-grandchildren. She attends a [employer]. Very involved with school kids.  ADVANCED DIRECTIVES:  Not in place  HEALTH MAINTENANCE: (Updated 06/12/2013) History  Substance Use Topics  . Smoking status: Never Smoker   . Smokeless tobacco: Never Used  . Alcohol Use: Yes     Comment: occasional     Colonoscopy: Never and "I don't want one"  PAP:  1987  Bone density:  Never  Lipid panel:  Not on file    Allergies  Allergen Reactions  . Penicillins Hives    PATIENT HAS TOLERATED CEPHALOSPORINGS  . Adhesive [Tape] Other (See Comments)    Tears skin     Current Outpatient Prescriptions  Medication Sig Dispense Refill  . acetaminophen (TYLENOL) 500  MG tablet Take 1,000 mg by mouth every 6 (six) hours as needed for mild pain or fever.     Marland Kitchen albuterol (PROVENTIL HFA;VENTOLIN HFA) 108 (90 BASE) MCG/ACT inhaler Inhale 2 puffs into the lungs every 6 (six) hours as needed for wheezing. 1 Inhaler 3  . ALPRAZolam (XANAX) 1 MG tablet Take 1 tablet (1 mg total) by mouth 3 (three) times daily as needed for anxiety. 90 tablet 1  . amLODipine (NORVASC) 10 MG tablet Take 10 mg by mouth every morning.    . baclofen (LIORESAL) 10 MG tablet TAKE 1 TABLET BY MOUTH THREE TIMES DAILY AS NEEDED FOR MUSCLE SPASMS 270 tablet 3  . carvedilol (COREG) 3.125 MG tablet TAKE 1 TABLET BY MOUTH TWICE DAILY 180 tablet 3  . diclofenac sodium (VOLTAREN) 1 % GEL Apply 2 g topically daily as needed (for pain). Apply to knees and shoulders 100 g 6  . furosemide (LASIX) 80 MG tablet Take 80 mg by mouth daily as needed for fluid.     Marland Kitchen gabapentin (NEURONTIN) 300 MG capsule TAKE 2 CAPSULES BY MOUTH THREE TIMES DAILY 180 capsule 2  .  losartan (COZAAR) 100 MG tablet Take 100 mg by mouth every morning.    . potassium chloride (K-DUR,KLOR-CON) 10 MEQ tablet Take 10 mEq by mouth 2 (two) times daily.    Marland Kitchen spironolactone (ALDACTONE) 25 MG tablet Take 0.5 tablets (12.5 mg total) by mouth daily. 30 tablet 3  . temazepam (RESTORIL) 30 MG capsule Take 1 capsule (30 mg total) by mouth at bedtime as needed for sleep. 30 capsule 1  . traMADol (ULTRAM) 50 MG tablet Take 2 tablets (100 mg total) by mouth every 6 (six) hours as needed. 120 tablet 0  . warfarin (COUMADIN) 5 MG tablet TAKE 1 TABLET BY MOUTH DAILY AS DIRECTED 90 tablet 0   No current facility-administered medications for this visit.   Facility-Administered Medications Ordered in Other Visits  Medication Dose Route Frequency Provider Last Rate Last Dose  . sodium chloride 0.9 % injection 10 mL  10 mL Intravenous PRN Chauncey Cruel, MD   10 mL at 12/17/13 1103  . sodium chloride 0.9 % injection 10 mL  10 mL Intravenous PRN Lennis Marion Downer, MD   10 mL at 12/20/14 1049    OBJECTIVE:  Middle aged African American woman in no acute distress Filed Vitals:   12/20/14 1145  BP: 172/95  Pulse: 71  Temp: 98.3 F (36.8 C)  Resp: 18     Body mass index is 52.93 kg/(m^2).    ECOG FS: 1 Filed Weights   12/20/14 1145  Weight: 298 lb 11.2 oz (135.489 kg)   Filed Weights   12/20/14 1145  Weight: 298 lb 11.2 oz (135.489 kg)   Sclerae unicteric, pupils equal and reactive Oropharynx clear and moist No cervical or supraclavicular adenopathy Lungs no rales or rhonchi Heart regular rate and rhythm Abd soft, nontender, positive bowel sounds MSK no focal spinal tenderness, no upper extremity lymphedema Neuro: nonfocal, well oriented, appropriate affect Breasts: Status post bilateral mastectomies. There is no evidence of chest wall recurrence. Both axillae are benign.    LAB RESULTS: Lab Results  Component Value Date   WBC 9.0 12/20/2014   NEUTROABS 5.0 12/20/2014   HGB  11.6 12/20/2014   HCT 33.4* 12/20/2014   MCV 79.0* 12/20/2014   PLT 265 12/20/2014      Chemistry      Component Value Date/Time   NA 141 12/20/2014 1024  NA 139 12/09/2014 0940   K 3.9 12/20/2014 1024   K 3.9 12/09/2014 0940   CL 107 12/09/2014 0940   CL 104 01/30/2013 0850   CO2 20* 12/20/2014 1024   CO2 25 12/09/2014 0940   BUN 17.0 12/20/2014 1024   BUN 16 12/09/2014 0940   CREATININE 0.9 12/20/2014 1024   CREATININE 0.75 12/09/2014 0940      Component Value Date/Time   CALCIUM 9.3 12/20/2014 1024   CALCIUM 8.8 12/09/2014 0940   ALKPHOS 92 12/20/2014 1024   ALKPHOS 94 12/09/2014 0940   AST 14 12/20/2014 1024   AST 20 12/09/2014 0940   ALT 13 12/20/2014 1024   ALT 20 12/09/2014 0940   BILITOT 0.28 12/20/2014 1024   BILITOT 0.4 12/09/2014 0940     STUDIES: Transthoracic Echocardiography  Patient:  Brenya, Taulbee MR #:    [id number] Study Date: 09/30/2014 Gender:   F Age:    40 Height:   157.5 cm Weight:   131.1 kg BSA:    2.48 m^2 Pt. Status: Room:  SONOGRAPHER Johny Chess, RDCS, CCT PERFORMING  [facility], Outpatient ATTENDING  Marcelino Duster Nathen May, Heather L REFERRING  Susanne Borders L  cc:  ------------------------------------------------------------------- LV EF: 60% -  65%  ------------------------------------------------------------------- History:  PMH: Breast Cancer.  Nm Pet Image Restag (ps) Skull Base To Thigh  12/16/2014   CLINICAL DATA:  Subsequent treatment strategy for metastatic breast cancer.  EXAM: NUCLEAR MEDICINE PET SKULL BASE TO THIGH  TECHNIQUE: 15.02 mCi F-18 FDG was injected intravenously. Full-ring PET imaging was performed from the skull base to thigh after the radiotracer. CT data was obtained and used for attenuation correction and anatomic localization.  FASTING BLOOD GLUCOSE:  Value: 97 mg/dl  COMPARISON:  06/01/2014  FINDINGS: NECK  No hypermetabolic lymph nodes in  the neck.  CHEST  Stable surgical changes from bilateral mastectomies. No interval change and bilateral postoperative fluid collections along the lateral margins of the pectoralis muscles. Stable slight hypermetabolism is noted but no discrete mass or interval change. Stable right-sided Port-A-Cath.  ABDOMEN/PELVIS  No abnormal hypermetabolic activity within the liver, pancreas, adrenal glands, or spleen. No hypermetabolic lymph nodes in the abdomen or pelvis.  SKELETON  No focal hypermetabolic activity to suggest skeletal metastasis.  IMPRESSION: Negative PET-CT. No findings for recurrent or metastatic breast cancer.   Electronically Signed   By: Marijo Sanes M.D.   On: 12/16/2014 12:00   Most recent echocardiogram on 07/16/14 showed an EF of 60-65%  ASSESSMENT: 64 y.o.  [place], [place], woman  (1)  with a history of inflammatory right breast cancer metastatic at presentation September 2004 with involvement of liver and bone, HER-2 positive, estrogen and progesterone receptor negative  (2) treated with carboplatin, docetaxel and Herceptin x 6 completed April 2005  (3) trastuzumab continued indefinitely;   (a) has also received lapatinib and capecitabine for variable intervals in 2007-2008.  (b) Most recent echo 09/30/2014 showed an ejection fraction of 55-60%.  (4) status post bilateral mastectomies with bilateral axillary lymph node dissection 12/07/2004, showing  (a) on the right, a mypT1c ypN1 invasive ductal carcinoma, grade 3, estrogen and progesterone receptor negative, HER-2 positive, with an MIB-1 of 31%  (b) on the left, ypT2 ypN1 invasive ductal carcinoma, grade 2, estrogen and progesterone receptor negative, HER-2 positive, with an MIB-1 of 35%.  (5)  Status post radiation June through July of 2006, to the right chest wall, left chest wall, bilateral supraclavicular fossae, and  bilateral axillary boosts; with additional radiation to the right and left chest walls and the  central chest wall completed November of 2007  (6) status post Ixempra x9 completed August of 2009.  (7) history of superior vena caval syndrome, on life long anticoagulation   (8)  History of chemotherapy-induced neuropathy.   (9)  chronic pain, with negative PET scan 08/24/2013 (no evidence of active cancer). On Neurontin and Tramadol  (10) right upper extremity cellulitis, no bacteremia; treated with cephalexin /doxycycline for 2 weeks, with resolution   PLAN: Letecia is doing terrific from a breast cancer point of view, with no evidence of active disease on her PET scan. Of course this does not mean that she is cured. It does mean that she is in remission at this point.  The plan is to continue trastuzumab every 3 weeks as before. She would be due to have a repeat echocardiogram late May but cardiology thinks is probably safe to make it every 4 months instead of 3.  We again went over the fact that in the absence of active disease the use of narcotics for chronic pain is discouraged. I suggested instead that she double her nighttime gabapentin and also move up her tramadol from 50 mg 200 mg each time she takes 1. Hopefully this will provide her more relief so that she can continue her increased activity regimen now that she has 2 grandchildren at home that she is largely caring for  She knows to call for any problems that may develop before her next visit here which will be in 3 months Zyier Dykema C, MD  12/20/2014 1:23 PM

## 2014-12-25 ENCOUNTER — Other Ambulatory Visit: Payer: Self-pay | Admitting: Oncology

## 2014-12-27 ENCOUNTER — Telehealth: Payer: Self-pay

## 2014-12-27 NOTE — Telephone Encounter (Signed)
Let pt know xanax rx is at the pharmacy.  Pt voiced understanding.

## 2014-12-28 ENCOUNTER — Other Ambulatory Visit: Payer: Self-pay | Admitting: Oncology

## 2014-12-28 NOTE — Telephone Encounter (Signed)
Chart reviewed.

## 2014-12-30 ENCOUNTER — Ambulatory Visit (HOSPITAL_BASED_OUTPATIENT_CLINIC_OR_DEPARTMENT_OTHER): Payer: Self-pay | Admitting: Pharmacist

## 2014-12-30 ENCOUNTER — Other Ambulatory Visit: Payer: Self-pay | Admitting: *Deleted

## 2014-12-30 ENCOUNTER — Ambulatory Visit: Payer: Commercial Managed Care - HMO

## 2014-12-30 ENCOUNTER — Other Ambulatory Visit (HOSPITAL_BASED_OUTPATIENT_CLINIC_OR_DEPARTMENT_OTHER): Payer: Commercial Managed Care - HMO

## 2014-12-30 ENCOUNTER — Ambulatory Visit (HOSPITAL_BASED_OUTPATIENT_CLINIC_OR_DEPARTMENT_OTHER): Payer: Commercial Managed Care - HMO

## 2014-12-30 DIAGNOSIS — I871 Compression of vein: Secondary | ICD-10-CM

## 2014-12-30 DIAGNOSIS — Z7901 Long term (current) use of anticoagulants: Secondary | ICD-10-CM

## 2014-12-30 DIAGNOSIS — C787 Secondary malignant neoplasm of liver and intrahepatic bile duct: Secondary | ICD-10-CM

## 2014-12-30 DIAGNOSIS — C50819 Malignant neoplasm of overlapping sites of unspecified female breast: Secondary | ICD-10-CM

## 2014-12-30 DIAGNOSIS — C50919 Malignant neoplasm of unspecified site of unspecified female breast: Secondary | ICD-10-CM

## 2014-12-30 DIAGNOSIS — Z5112 Encounter for antineoplastic immunotherapy: Secondary | ICD-10-CM | POA: Diagnosis not present

## 2014-12-30 DIAGNOSIS — I82409 Acute embolism and thrombosis of unspecified deep veins of unspecified lower extremity: Secondary | ICD-10-CM

## 2014-12-30 DIAGNOSIS — Z95828 Presence of other vascular implants and grafts: Secondary | ICD-10-CM

## 2014-12-30 LAB — CBC WITH DIFFERENTIAL/PLATELET
BASO%: 0.3 % (ref 0.0–2.0)
Basophils Absolute: 0 10*3/uL (ref 0.0–0.1)
EOS%: 2.2 % (ref 0.0–7.0)
Eosinophils Absolute: 0.2 10*3/uL (ref 0.0–0.5)
HCT: 33.6 % — ABNORMAL LOW (ref 34.8–46.6)
HGB: 11.6 g/dL (ref 11.6–15.9)
LYMPH%: 40.2 % (ref 14.0–49.7)
MCH: 27.6 pg (ref 25.1–34.0)
MCHC: 34.5 g/dL (ref 31.5–36.0)
MCV: 79.8 fL (ref 79.5–101.0)
MONO#: 0.6 10*3/uL (ref 0.1–0.9)
MONO%: 7.9 % (ref 0.0–14.0)
NEUT#: 3.9 10*3/uL (ref 1.5–6.5)
NEUT%: 49.4 % (ref 38.4–76.8)
Platelets: 232 10*3/uL (ref 145–400)
RBC: 4.21 10*6/uL (ref 3.70–5.45)
RDW: 16.1 % — AB (ref 11.2–14.5)
WBC: 7.9 10*3/uL (ref 3.9–10.3)
lymph#: 3.2 10*3/uL (ref 0.9–3.3)

## 2014-12-30 LAB — COMPREHENSIVE METABOLIC PANEL (CC13)
ALK PHOS: 88 U/L (ref 40–150)
ALT: 13 U/L (ref 0–55)
AST: 13 U/L (ref 5–34)
Albumin: 3.4 g/dL — ABNORMAL LOW (ref 3.5–5.0)
Anion Gap: 12 mEq/L — ABNORMAL HIGH (ref 3–11)
BUN: 17.5 mg/dL (ref 7.0–26.0)
CALCIUM: 9.2 mg/dL (ref 8.4–10.4)
CO2: 25 mEq/L (ref 22–29)
CREATININE: 0.8 mg/dL (ref 0.6–1.1)
Chloride: 107 mEq/L (ref 98–109)
EGFR: >90 mL/min/{1.73_m2} (ref >90)
Glucose: 95 mg/dl (ref 70–140)
POTASSIUM: 4.3 meq/L (ref 3.5–5.1)
SODIUM: 143 meq/L (ref 136–145)
Total Bilirubin: 0.56 mg/dL (ref 0.20–1.20)
Total Protein: 7.7 g/dL (ref 6.4–8.3)

## 2014-12-30 LAB — POCT INR: INR: 2.5

## 2014-12-30 LAB — PROTIME-INR
INR: 2.5 (ref 2.00–3.50)
Protime: 30 Seconds — ABNORMAL HIGH (ref 10.6–13.4)

## 2014-12-30 MED ORDER — SODIUM CHLORIDE 0.9 % IJ SOLN
10.0000 mL | INTRAMUSCULAR | Status: DC | PRN
  Administered 2014-12-30: 10 mL
  Filled 2014-12-30: qty 10

## 2014-12-30 MED ORDER — ALPRAZOLAM 1 MG PO TABS: 1.0000 mg | ORAL_TABLET | Freq: Three times a day (TID) | ORAL | Status: DC | PRN

## 2014-12-30 MED ORDER — SODIUM CHLORIDE 0.9 % IV SOLN
6.0000 mg/kg | Freq: Once | INTRAVENOUS | Status: AC
  Administered 2014-12-30: 798 mg via INTRAVENOUS
  Filled 2014-12-30: qty 38

## 2014-12-30 MED ORDER — LORAZEPAM 2 MG/ML IJ SOLN
INTRAMUSCULAR | Status: AC
  Filled 2014-12-30: qty 1

## 2014-12-30 MED ORDER — DIPHENHYDRAMINE HCL 25 MG PO CAPS
50.0000 mg | ORAL_CAPSULE | Freq: Once | ORAL | Status: AC
  Administered 2014-12-30: 50 mg via ORAL

## 2014-12-30 MED ORDER — ACETAMINOPHEN 325 MG PO TABS
650.0000 mg | ORAL_TABLET | Freq: Once | ORAL | Status: AC
  Administered 2014-12-30: 650 mg via ORAL

## 2014-12-30 MED ORDER — SODIUM CHLORIDE 0.9 % IV SOLN
Freq: Once | INTRAVENOUS | Status: AC
  Administered 2014-12-30: 10:00:00 via INTRAVENOUS

## 2014-12-30 MED ORDER — ACETAMINOPHEN 325 MG PO TABS
ORAL_TABLET | ORAL | Status: AC
  Filled 2014-12-30: qty 2

## 2014-12-30 MED ORDER — TEMAZEPAM 30 MG PO CAPS: ORAL_CAPSULE | ORAL | Status: DC

## 2014-12-30 MED ORDER — LORAZEPAM 2 MG/ML IJ SOLN
1.0000 mg | Freq: Once | INTRAMUSCULAR | Status: AC | PRN
  Administered 2014-12-30: 1 mg via INTRAVENOUS

## 2014-12-30 MED ORDER — SODIUM CHLORIDE 0.9 % IJ SOLN
10.0000 mL | INTRAMUSCULAR | Status: DC | PRN
  Administered 2014-12-30: 10 mL via INTRAVENOUS
  Filled 2014-12-30: qty 10

## 2014-12-30 MED ORDER — DIPHENHYDRAMINE HCL 25 MG PO CAPS
ORAL_CAPSULE | ORAL | Status: AC
  Filled 2014-12-30: qty 2

## 2014-12-30 MED ORDER — HEPARIN SOD (PORK) LOCK FLUSH 100 UNIT/ML IV SOLN
500.0000 [IU] | Freq: Once | INTRAVENOUS | Status: AC | PRN
  Administered 2014-12-30: 500 [IU]
  Filled 2014-12-30: qty 5

## 2014-12-30 NOTE — Patient Instructions (Signed)

## 2014-12-30 NOTE — Progress Notes (Signed)
INR = 2.5 on Coumadin 7.5 mg daily except 5 mg on Mondays. Pt has no complaints re: anticoag today. She has had no recent med changes. INR at goal.  No change to dose. Repeat protime in 6 weeks when here for another infusion. Kennith Center, Pharm.D., CPP 12/30/2014@10 :43 AM

## 2014-12-30 NOTE — Patient Instructions (Signed)
[facility] Discharge Instructions for Patients Receiving Chemotherapy  Today you received the following chemotherapy agents Herceptin.  To help prevent nausea and vomiting after your treatment, we encourage you to take your nausea medication as prescribed by your physician. If you develop nausea and vomiting that is not controlled by your nausea medication, call the clinic.   BELOW ARE SYMPTOMS THAT SHOULD BE REPORTED IMMEDIATELY:  *FEVER GREATER THAN 100.5 F  *CHILLS WITH OR WITHOUT FEVER  NAUSEA AND VOMITING THAT IS NOT CONTROLLED WITH YOUR NAUSEA MEDICATION  *UNUSUAL SHORTNESS OF BREATH  *UNUSUAL BRUISING OR BLEEDING  TENDERNESS IN MOUTH AND THROAT WITH OR WITHOUT PRESENCE OF ULCERS  *URINARY PROBLEMS  *BOWEL PROBLEMS  UNUSUAL RASH Items with * indicate a potential emergency and should be followed up as soon as possible.  Feel free to call the clinic you have any questions or concerns. The clinic phone number is [phone].  Please show the CHEMO ALERT CARD at check-in to the Emergency Department and triage nurse.   

## 2015-01-05 ENCOUNTER — Other Ambulatory Visit (HOSPITAL_COMMUNITY): Payer: Self-pay | Admitting: Cardiology

## 2015-01-20 ENCOUNTER — Other Ambulatory Visit (HOSPITAL_BASED_OUTPATIENT_CLINIC_OR_DEPARTMENT_OTHER): Payer: Commercial Managed Care - HMO

## 2015-01-20 ENCOUNTER — Telehealth: Payer: Self-pay | Admitting: *Deleted

## 2015-01-20 ENCOUNTER — Telehealth: Payer: Self-pay | Admitting: Oncology

## 2015-01-20 ENCOUNTER — Ambulatory Visit (HOSPITAL_BASED_OUTPATIENT_CLINIC_OR_DEPARTMENT_OTHER): Payer: Commercial Managed Care - HMO

## 2015-01-20 ENCOUNTER — Ambulatory Visit: Payer: Commercial Managed Care - HMO

## 2015-01-20 VITALS — BP 144/78 | HR 71 | Temp 98.8°F | Resp 16

## 2015-01-20 DIAGNOSIS — C7951 Secondary malignant neoplasm of bone: Secondary | ICD-10-CM

## 2015-01-20 DIAGNOSIS — C50811 Malignant neoplasm of overlapping sites of right female breast: Secondary | ICD-10-CM

## 2015-01-20 DIAGNOSIS — C787 Secondary malignant neoplasm of liver and intrahepatic bile duct: Secondary | ICD-10-CM

## 2015-01-20 DIAGNOSIS — I871 Compression of vein: Secondary | ICD-10-CM | POA: Diagnosis not present

## 2015-01-20 DIAGNOSIS — Z7901 Long term (current) use of anticoagulants: Secondary | ICD-10-CM

## 2015-01-20 DIAGNOSIS — Z95828 Presence of other vascular implants and grafts: Secondary | ICD-10-CM

## 2015-01-20 DIAGNOSIS — C50919 Malignant neoplasm of unspecified site of unspecified female breast: Secondary | ICD-10-CM

## 2015-01-20 DIAGNOSIS — I82409 Acute embolism and thrombosis of unspecified deep veins of unspecified lower extremity: Secondary | ICD-10-CM

## 2015-01-20 DIAGNOSIS — Z5112 Encounter for antineoplastic immunotherapy: Secondary | ICD-10-CM

## 2015-01-20 LAB — PROTIME-INR
INR: 2.6 (ref 2.00–3.50)
Protime: 31.2 Seconds — ABNORMAL HIGH (ref 10.6–13.4)

## 2015-01-20 LAB — CBC WITH DIFFERENTIAL/PLATELET
BASO%: 0.5 % (ref 0.0–2.0)
BASOS ABS: 0 10*3/uL (ref 0.0–0.1)
EOS%: 1.9 % (ref 0.0–7.0)
Eosinophils Absolute: 0.2 10*3/uL (ref 0.0–0.5)
HCT: 35.1 % (ref 34.8–46.6)
HGB: 12.3 g/dL (ref 11.6–15.9)
LYMPH%: 30.3 % (ref 14.0–49.7)
MCH: 27.9 pg (ref 25.1–34.0)
MCHC: 35 g/dL (ref 31.5–36.0)
MCV: 79.8 fL (ref 79.5–101.0)
MONO#: 0.7 10*3/uL (ref 0.1–0.9)
MONO%: 8.6 % (ref 0.0–14.0)
NEUT#: 4.9 10*3/uL (ref 1.5–6.5)
NEUT%: 58.7 % (ref 38.4–76.8)
Platelets: 260 10*3/uL (ref 145–400)
RBC: 4.4 10*6/uL (ref 3.70–5.45)
RDW: 15.6 % — ABNORMAL HIGH (ref 11.2–14.5)
WBC: 8.4 10*3/uL (ref 3.9–10.3)
lymph#: 2.6 10*3/uL (ref 0.9–3.3)

## 2015-01-20 LAB — COMPREHENSIVE METABOLIC PANEL (CC13)
ALBUMIN: 3.5 g/dL (ref 3.5–5.0)
ALK PHOS: 86 U/L (ref 40–150)
ALT: 10 U/L (ref 0–55)
ANION GAP: 13 meq/L — AB (ref 3–11)
AST: 14 U/L (ref 5–34)
BILIRUBIN TOTAL: 0.67 mg/dL (ref 0.20–1.20)
BUN: 14.5 mg/dL (ref 7.0–26.0)
CALCIUM: 9 mg/dL (ref 8.4–10.4)
CO2: 23 mEq/L (ref 22–29)
CREATININE: 0.8 mg/dL (ref 0.6–1.1)
Chloride: 106 mEq/L (ref 98–109)
EGFR: 88 mL/min/{1.73_m2} — AB (ref >90)
GLUCOSE: 105 mg/dL (ref 70–140)
Potassium: 3.7 mEq/L (ref 3.5–5.1)
Sodium: 142 mEq/L (ref 136–145)
TOTAL PROTEIN: 8.2 g/dL (ref 6.4–8.3)

## 2015-01-20 MED ORDER — TRASTUZUMAB CHEMO INJECTION 440 MG
6.0000 mg/kg | Freq: Once | INTRAVENOUS | Status: AC
  Administered 2015-01-20: 798 mg via INTRAVENOUS
  Filled 2015-01-20: qty 38

## 2015-01-20 MED ORDER — SODIUM CHLORIDE 0.9 % IV SOLN
Freq: Once | INTRAVENOUS | Status: AC
  Administered 2015-01-20: 09:00:00 via INTRAVENOUS

## 2015-01-20 MED ORDER — DIPHENHYDRAMINE HCL 25 MG PO CAPS
ORAL_CAPSULE | ORAL | Status: AC
  Filled 2015-01-20: qty 2

## 2015-01-20 MED ORDER — ACETAMINOPHEN 325 MG PO TABS
ORAL_TABLET | ORAL | Status: AC
  Filled 2015-01-20: qty 2

## 2015-01-20 MED ORDER — SODIUM CHLORIDE 0.9 % IJ SOLN
10.0000 mL | INTRAMUSCULAR | Status: DC | PRN
  Administered 2015-01-20: 10 mL via INTRAVENOUS
  Filled 2015-01-20: qty 10

## 2015-01-20 MED ORDER — SODIUM CHLORIDE 0.9 % IJ SOLN
10.0000 mL | INTRAMUSCULAR | Status: DC | PRN
  Administered 2015-01-20: 10 mL
  Filled 2015-01-20: qty 10

## 2015-01-20 MED ORDER — ACETAMINOPHEN 325 MG PO TABS
650.0000 mg | ORAL_TABLET | Freq: Once | ORAL | Status: AC
  Administered 2015-01-20: 650 mg via ORAL

## 2015-01-20 MED ORDER — LORAZEPAM 2 MG/ML IJ SOLN
INTRAMUSCULAR | Status: AC
  Filled 2015-01-20: qty 1

## 2015-01-20 MED ORDER — HEPARIN SOD (PORK) LOCK FLUSH 100 UNIT/ML IV SOLN
500.0000 [IU] | Freq: Once | INTRAVENOUS | Status: AC | PRN
  Administered 2015-01-20: 500 [IU]
  Filled 2015-01-20: qty 5

## 2015-01-20 MED ORDER — DIPHENHYDRAMINE HCL 25 MG PO CAPS
50.0000 mg | ORAL_CAPSULE | Freq: Once | ORAL | Status: AC
  Administered 2015-01-20: 50 mg via ORAL

## 2015-01-20 MED ORDER — LORAZEPAM 2 MG/ML IJ SOLN
1.0000 mg | Freq: Once | INTRAMUSCULAR | Status: AC | PRN
  Administered 2015-01-20: 1 mg via INTRAVENOUS

## 2015-01-20 NOTE — Telephone Encounter (Signed)
Message retrieved requesting appointments for flush and coumadin clinic for the next three months.  Observed appointments scheduled with no changes needed.  Patient will receive schedule at the next appointment.

## 2015-01-20 NOTE — Patient Instructions (Signed)
[facility] Discharge Instructions for Patients Receiving Chemotherapy  Today you received the following chemotherapy agents: Herceptin   To help prevent nausea and vomiting after your treatment, we encourage you to take your nausea medication as directed.    If you develop nausea and vomiting that is not controlled by your nausea medication, call the clinic.   BELOW ARE SYMPTOMS THAT SHOULD BE REPORTED IMMEDIATELY:  *FEVER GREATER THAN 100.5 F  *CHILLS WITH OR WITHOUT FEVER  NAUSEA AND VOMITING THAT IS NOT CONTROLLED WITH YOUR NAUSEA MEDICATION  *UNUSUAL SHORTNESS OF BREATH  *UNUSUAL BRUISING OR BLEEDING  TENDERNESS IN MOUTH AND THROAT WITH OR WITHOUT PRESENCE OF ULCERS  *URINARY PROBLEMS  *BOWEL PROBLEMS  UNUSUAL RASH Items with * indicate a potential emergency and should be followed up as soon as possible.  Feel free to call the clinic you have any questions or concerns. The clinic phone number is [phone].  Please show the CHEMO ALERT CARD at check-in to the Emergency Department and triage nurse.   

## 2015-01-20 NOTE — Telephone Encounter (Signed)
Per pof flush appointments have been added and patient will get a new avs in chemo

## 2015-01-20 NOTE — Patient Instructions (Signed)

## 2015-01-25 ENCOUNTER — Ambulatory Visit (HOSPITAL_COMMUNITY)
Admission: RE | Admit: 2015-01-25 | Discharge: 2015-01-25 | Disposition: A | Discharge status: Home / Self Care | Payer: Commercial Managed Care - HMO | Source: Ambulatory Visit | Attending: Cardiology | Admitting: Cardiology

## 2015-01-25 ENCOUNTER — Encounter (HOSPITAL_COMMUNITY): Payer: Self-pay

## 2015-01-25 ENCOUNTER — Ambulatory Visit (HOSPITAL_BASED_OUTPATIENT_CLINIC_OR_DEPARTMENT_OTHER)
Admission: RE | Admit: 2015-01-25 | Discharge: 2015-01-25 | Disposition: A | Discharge status: Home / Self Care | Payer: Commercial Managed Care - HMO | Source: Ambulatory Visit | Attending: Cardiology | Admitting: Cardiology

## 2015-01-25 VITALS — BP 122/68 | HR 61 | Resp 18 | Wt 301.5 lb

## 2015-01-25 DIAGNOSIS — I5020 Unspecified systolic (congestive) heart failure: Secondary | ICD-10-CM | POA: Insufficient documentation

## 2015-01-25 DIAGNOSIS — C50919 Malignant neoplasm of unspecified site of unspecified female breast: Secondary | ICD-10-CM | POA: Diagnosis not present

## 2015-01-25 DIAGNOSIS — C799 Secondary malignant neoplasm of unspecified site: Secondary | ICD-10-CM | POA: Diagnosis not present

## 2015-01-25 NOTE — Progress Notes (Signed)
  Echocardiogram 2D Echocardiogram has been performed.  Yaphet Smethurst, Dierks 01/25/2015, 11:13 AM

## 2015-01-25 NOTE — Patient Instructions (Signed)
Doing great!  Follow up 3 months with echocardiogram.  Do the following things EVERYDAY: 1) Weigh yourself in the morning before breakfast. Write it down and keep it in a log. 2) Take your medicines as prescribed 3) Eat low salt foods-Limit salt (sodium) to 2000 mg per day.  4) Stay as active as you can everyday 5) Limit all fluids for the day to less than 2 liters

## 2015-01-25 NOTE — Progress Notes (Signed)
Patient ID: IMUNIQUE SAMAD, female   DOB: [birth year], 64 y.o.   MRN: [id number] Oncologist: Dr Jana Hakim  HPI: [name] is a 64 year old with a history of metastatic HER-2 positive breast carcinoma originally diagnosed September 2004. Started in R breast. Underwent neo-adjuvant chemo. During this time developed L breast CA. Underwent bilateral mastectomy. Lymph nodes +. 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   She received a total of 6 cycles of Taxotere/Carbo/Herceptin, completed in April 2005, after which she began single agent Herceptin, given every 3 weeks. Plan to continue on Herceptin indefinitely.   No dyspnea or chest pain.  Uses Lasix rarely, 1-2 times/month.   Labs (11/15): K 4, creatinine 1.0   Labs (1/16): K 4.1, creatinine 1.0  04/23/12 ECHO EF 60-65% Lateral s' 8.9 cm/s  07/30/12 ECHO EF 60-65% Lateral s' 8.3 cm/s  09/11/12 ECHO EF 60-65% Lateral s' 10.3 cm/s  12/22/12 ECHO 55-60% Lateral S' 9.8 RV mildly dilated  07/01/13 ECHO 55-60%, lateral s' 9.79, mild RV dilation, grade II DD 3/15 ECHO 55%, mild MR, lateral s' 9.6, GLS -19.2% 03/02/14 ECHO EF 55-60% Lateral S' 9.4 GLS - 21.9  11/15 ECHO EF 60-65%, lateral S' 6.8, GLS -17.2%, mild RV dilation with normal systolic function, mild MR.  2/16 ECHO EF 60-65%, lateral S' 14.4, GLS -55.2%, normal diastolic function, mild RV dilation with normal RV systolic function.  5/16 ECHO EF 55-60%, lateral s' not adequately measured, GLS -08.0%, normal diastolic function, normal RV  ROS: All systems negative except as listed in HPI, PMH and Problem List.  Past Medical History  Diagnosis Date  . Breast cancer     mets to liver and lung  . Hypertension   . SVC syndrome   . History of chemotherapy Feb. 2006    taxotere/herceptin/carboplatin  . Radiation 07/31/2006    left upper chest  . Radiation 06/17/2006-06/27/2006    6480 cGy bilat. chest wall  . Neuropathy   . Thrombosis   . Breast  cancer metastasized to multiple sites 02/26/2013    Current Outpatient Prescriptions  Medication Sig Dispense Refill  . acetaminophen (TYLENOL) 500 MG tablet Take 1,000 mg by mouth every 6 (six) hours as needed for mild pain or fever.     Marland Kitchen albuterol (PROVENTIL HFA;VENTOLIN HFA) 108 (90 BASE) MCG/ACT inhaler Inhale 2 puffs into the lungs every 6 (six) hours as needed for wheezing. 1 Inhaler 3  . ALPRAZolam (XANAX) 1 MG tablet Take 1 tablet (1 mg total) by mouth 3 (three) times daily as needed. for anxiety 90 tablet 0  . amLODipine (NORVASC) 10 MG tablet Take 10 mg by mouth every morning.    . baclofen (LIORESAL) 10 MG tablet TAKE 1 TABLET BY MOUTH THREE TIMES DAILY AS NEEDED FOR MUSCLE SPASMS 270 tablet 3  . carvedilol (COREG) 3.125 MG tablet TAKE 1 TABLET BY MOUTH TWICE DAILY 180 tablet 3  . diclofenac sodium (VOLTAREN) 1 % GEL Apply 2 g topically daily as needed (for pain). Apply to knees and shoulders 100 g 6  . furosemide (LASIX) 80 MG tablet Take 80 mg by mouth daily as needed for fluid.     Marland Kitchen gabapentin (NEURONTIN) 300 MG capsule TAKE 2 CAPSULES BY MOUTH THREE TIMES DAILY 180 capsule 2  . losartan (COZAAR) 100 MG tablet Take 100 mg by mouth every morning.    . potassium chloride (K-DUR,KLOR-CON) 10 MEQ tablet Take 40 mEq by mouth daily as needed.     Marland Kitchen  spironolactone (ALDACTONE) 25 MG tablet Take 0.5 tablets (12.5 mg total) by mouth daily. 30 tablet 3  . temazepam (RESTORIL) 30 MG capsule TAKE 1 CAPSULE BY MOUTH EVERY DAY AT BEDTIME AS NEEDED FOR SLEEP 30 capsule 0  . traMADol (ULTRAM) 50 MG tablet Take 2 tablets (100 mg total) by mouth every 6 (six) hours as needed. 120 tablet 0  . warfarin (COUMADIN) 5 MG tablet TAKE 1 TABLET BY MOUTH DAILY AS DIRECTED 90 tablet 0   No current facility-administered medications for this encounter.   Facility-Administered Medications Ordered in Other Encounters  Medication Dose Route Frequency Provider Last Rate Last Dose  . sodium chloride 0.9 %  injection 10 mL  10 mL Intravenous PRN Chauncey Cruel, MD   10 mL at 12/17/13 1103    Filed Vitals:   01/25/15 1111  BP: 122/68  Pulse: 61  Resp: 18  Weight: 301 lb 8 oz (136.76 kg)  SpO2: 98%    PHYSICAL EXAM: General: NAD, obese. HEENT: normal  Neck: Thick. no JVD difficult to assess d/t body habitus but does not appear elevated; Carotids 2+ bilat; no bruits. No lymphadenopathy or thryomegaly appreciated.  Cor: PMI nondisplaced. Regular rate & rhythm. No rubs, gallops or murmurs. S/P bilateral mastectomies  Lungs: clear  Abdomen: obese. soft, nontender, nondistended. Good bowel sounds.  Extremities: no cyanosis, clubbing, rash.  No edema.  Neuro: alert & oriented x 3, cranial nerves grossly intact. moves all 4 extremities w/o difficulty. Affect pleasant.  ASSESSMENT & PLAN: 1) L Breast Cancer s/p bilateral mastectomies:  Symptomatically stable. Today's echo was technically difficult.  LV EF is normal and diastolic function is normal.  Lateral s' was not adequately measured.  Strain images were not great, GLS was -18.3% which is lower than prior, but given limitations of image quality I think this is ok.  She can continue Herceptin.  She will also continue losartan and Coreg.  Followup in 3 months for echo and office visit.  2) Suspected OSA: Patient has thick neck, mild RV dilation, snores at night.  Prefers to hold on sleep study.   3) HTN: Stable .   Loralie Champagne 01/25/2015

## 2015-01-31 ENCOUNTER — Other Ambulatory Visit: Payer: Self-pay | Admitting: Oncology

## 2015-01-31 ENCOUNTER — Other Ambulatory Visit: Payer: Self-pay | Admitting: *Deleted

## 2015-02-08 IMAGING — PT NM PET TUM IMG RESTAG (PS) SKULL BASE T - THIGH
1 of 4 series · 2 of 25 positions shown · non-contrast
Comparison: 05/21/2012

CLINICAL DATA: Subsequent treatment strategy for metastatic breast
carcinoma.

NUCLEAR MEDICINE PET SKULL BASE TO THIGH
Fasting Blood Glucose:  87
TECHNIQUE: 15.4 mCi F-18 FDG was injected intravenously. CT data
was obtained and used for attenuation correction and anatomic
localization only.  (This was not acquired as a diagnostic CT
examination.) Additional exam technical data entered on
technologist worksheet.

[Series 2: ct images · axial · 3.8mm · 0.98mm/px · z∈[-88,+0]mm · 2 of 267 slices shown]
[im 240/267  brain]
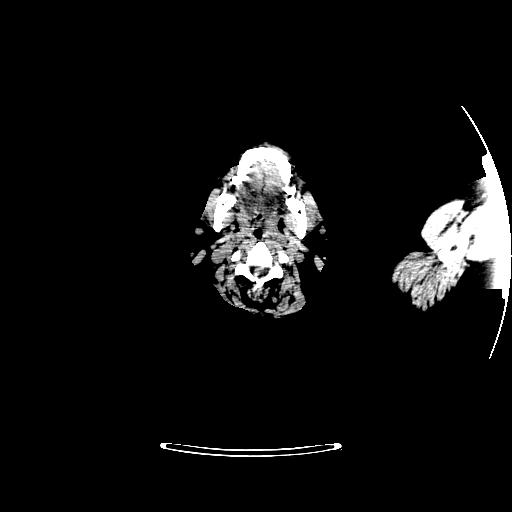
[im 267/267  brain]
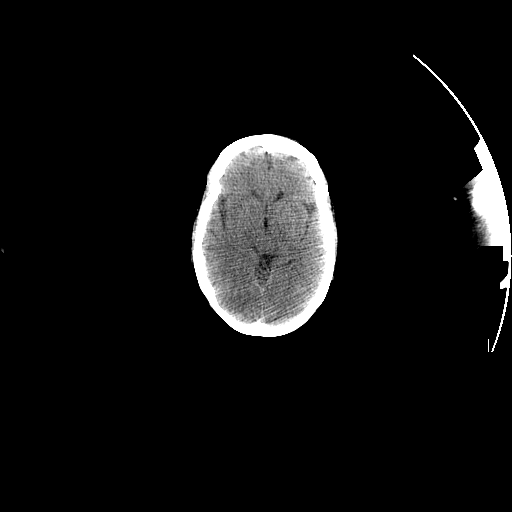

[2 of 25 positions shown; findings below may reference images not displayed]

FINDINGS: Neck: Shotty less than 1 cm right level II upper internal jugular
lymph nodes are seen which show new hypermetabolic activity on
today's study, with maximum SUV measuring 5.2. No other
hypermetabolic lymph nodes identified within the neck

Chest:  No hypermetabolic mediastinal or hilar nodes.  No
suspicious pulmonary nodules on the CT scan.  Postop changes from
bilateral mastectomies and axillary lymph node dissections are
stable in appearance.

Abdomen/Pelvis:  No abnormal hypermetabolic activity within the
liver, pancreas, adrenal glands, or spleen.  No hypermetabolic
lymph nodes in the abdomen or pelvis. Calcified mass in the right
pelvis with hypermetabolic activity is stable, and most likely
represents a subserosal fibroid.

Skeleton:  No focal hypermetabolic activity to suggest skeletal
metastasis.
IMPRESSION: 1. Mild new hypermetabolic activity within small less than 1 cm
right level II cervical lymph nodes, which is nonspecific.
Continued imaging followup by PET or CT is suggested given the sub
centimeter size of these lymph nodes.
2.  No other significant interval change or signs of metastatic
disease.
3.  Stable probable calcified fibroid in the right pelvis.

## 2015-02-10 ENCOUNTER — Ambulatory Visit (HOSPITAL_BASED_OUTPATIENT_CLINIC_OR_DEPARTMENT_OTHER): Payer: Commercial Managed Care - HMO

## 2015-02-10 ENCOUNTER — Ambulatory Visit: Payer: Commercial Managed Care - HMO

## 2015-02-10 ENCOUNTER — Other Ambulatory Visit (HOSPITAL_BASED_OUTPATIENT_CLINIC_OR_DEPARTMENT_OTHER): Payer: Commercial Managed Care - HMO

## 2015-02-10 ENCOUNTER — Other Ambulatory Visit: Payer: Commercial Managed Care - HMO

## 2015-02-10 ENCOUNTER — Ambulatory Visit (HOSPITAL_BASED_OUTPATIENT_CLINIC_OR_DEPARTMENT_OTHER): Payer: Self-pay | Admitting: Pharmacist

## 2015-02-10 VITALS — BP 140/74 | HR 72 | Temp 98.4°F

## 2015-02-10 DIAGNOSIS — I82409 Acute embolism and thrombosis of unspecified deep veins of unspecified lower extremity: Secondary | ICD-10-CM

## 2015-02-10 DIAGNOSIS — C787 Secondary malignant neoplasm of liver and intrahepatic bile duct: Secondary | ICD-10-CM | POA: Diagnosis not present

## 2015-02-10 DIAGNOSIS — C7951 Secondary malignant neoplasm of bone: Secondary | ICD-10-CM

## 2015-02-10 DIAGNOSIS — Z95828 Presence of other vascular implants and grafts: Secondary | ICD-10-CM

## 2015-02-10 DIAGNOSIS — Z7901 Long term (current) use of anticoagulants: Secondary | ICD-10-CM

## 2015-02-10 DIAGNOSIS — Z5112 Encounter for antineoplastic immunotherapy: Secondary | ICD-10-CM

## 2015-02-10 DIAGNOSIS — C50919 Malignant neoplasm of unspecified site of unspecified female breast: Secondary | ICD-10-CM

## 2015-02-10 DIAGNOSIS — C50811 Malignant neoplasm of overlapping sites of right female breast: Secondary | ICD-10-CM | POA: Diagnosis not present

## 2015-02-10 LAB — COMPREHENSIVE METABOLIC PANEL (CC13)
ALK PHOS: 95 U/L (ref 40–150)
ALT: 13 U/L (ref 0–55)
AST: 12 U/L (ref 5–34)
Albumin: 3.4 g/dL — ABNORMAL LOW (ref 3.5–5.0)
Anion Gap: 8 mEq/L (ref 3–11)
BILIRUBIN TOTAL: 0.56 mg/dL (ref 0.20–1.20)
BUN: 16.5 mg/dL (ref 7.0–26.0)
CO2: 26 meq/L (ref 22–29)
CREATININE: 1 mg/dL (ref 0.6–1.1)
Calcium: 9 mg/dL (ref 8.4–10.4)
Chloride: 110 mEq/L — ABNORMAL HIGH (ref 98–109)
EGFR: 74 mL/min/{1.73_m2} — AB (ref >90)
Glucose: 107 mg/dl (ref 70–140)
Potassium: 4.1 mEq/L (ref 3.5–5.1)
Sodium: 144 mEq/L (ref 136–145)
Total Protein: 7.7 g/dL (ref 6.4–8.3)

## 2015-02-10 LAB — CBC WITH DIFFERENTIAL/PLATELET
BASO%: 0.4 % (ref 0.0–2.0)
Basophils Absolute: 0 10*3/uL (ref 0.0–0.1)
EOS ABS: 0.1 10*3/uL (ref 0.0–0.5)
EOS%: 1.8 % (ref 0.0–7.0)
HCT: 34.3 % — ABNORMAL LOW (ref 34.8–46.6)
HEMOGLOBIN: 11.9 g/dL (ref 11.6–15.9)
LYMPH%: 30.4 % (ref 14.0–49.7)
MCH: 27.9 pg (ref 25.1–34.0)
MCHC: 34.7 g/dL (ref 31.5–36.0)
MCV: 80.5 fL (ref 79.5–101.0)
MONO#: 0.6 10*3/uL (ref 0.1–0.9)
MONO%: 7.7 % (ref 0.0–14.0)
NEUT%: 59.7 % (ref 38.4–76.8)
NEUTROS ABS: 4.8 10*3/uL (ref 1.5–6.5)
Platelets: 233 10*3/uL (ref 145–400)
RBC: 4.26 10*6/uL (ref 3.70–5.45)
RDW: 15.4 % — AB (ref 11.2–14.5)
WBC: 8 10*3/uL (ref 3.9–10.3)
lymph#: 2.4 10*3/uL (ref 0.9–3.3)

## 2015-02-10 LAB — PROTIME-INR
INR: 1.4 — AB (ref 2.00–3.50)
Protime: 16.8 Seconds — ABNORMAL HIGH (ref 10.6–13.4)

## 2015-02-10 LAB — POCT INR: INR: 1.4

## 2015-02-10 MED ORDER — DIPHENHYDRAMINE HCL 25 MG PO CAPS
50.0000 mg | ORAL_CAPSULE | Freq: Once | ORAL | Status: AC
  Administered 2015-02-10: 50 mg via ORAL

## 2015-02-10 MED ORDER — LORAZEPAM 2 MG/ML IJ SOLN
1.0000 mg | Freq: Once | INTRAMUSCULAR | Status: AC | PRN
  Administered 2015-02-10: 1 mg via INTRAVENOUS

## 2015-02-10 MED ORDER — HEPARIN SOD (PORK) LOCK FLUSH 100 UNIT/ML IV SOLN
500.0000 [IU] | Freq: Once | INTRAVENOUS | Status: AC | PRN
  Administered 2015-02-10: 500 [IU]
  Filled 2015-02-10: qty 5

## 2015-02-10 MED ORDER — SODIUM CHLORIDE 0.9 % IV SOLN
Freq: Once | INTRAVENOUS | Status: AC
  Administered 2015-02-10: 09:00:00 via INTRAVENOUS

## 2015-02-10 MED ORDER — ACETAMINOPHEN 325 MG PO TABS
ORAL_TABLET | ORAL | Status: AC
  Filled 2015-02-10: qty 2

## 2015-02-10 MED ORDER — DIPHENHYDRAMINE HCL 25 MG PO CAPS
ORAL_CAPSULE | ORAL | Status: AC
  Filled 2015-02-10: qty 2

## 2015-02-10 MED ORDER — LORAZEPAM 2 MG/ML IJ SOLN
INTRAMUSCULAR | Status: AC
  Filled 2015-02-10: qty 1

## 2015-02-10 MED ORDER — ACETAMINOPHEN 325 MG PO TABS
650.0000 mg | ORAL_TABLET | Freq: Once | ORAL | Status: AC
  Administered 2015-02-10: 650 mg via ORAL

## 2015-02-10 MED ORDER — TRASTUZUMAB CHEMO INJECTION 440 MG
6.0000 mg/kg | Freq: Once | INTRAVENOUS | Status: AC
  Administered 2015-02-10: 798 mg via INTRAVENOUS
  Filled 2015-02-10: qty 38

## 2015-02-10 MED ORDER — SODIUM CHLORIDE 0.9 % IJ SOLN
10.0000 mL | INTRAMUSCULAR | Status: DC | PRN
  Administered 2015-02-10: 10 mL
  Filled 2015-02-10: qty 10

## 2015-02-10 MED ORDER — SODIUM CHLORIDE 0.9 % IJ SOLN
10.0000 mL | INTRAMUSCULAR | Status: DC | PRN
  Administered 2015-02-10: 10 mL via INTRAVENOUS
  Filled 2015-02-10: qty 10

## 2015-02-10 NOTE — Progress Notes (Signed)
INR = 1.4   Goal 2-3 INR is below goal range. [name] states she has eaten "lots and lots" of green vegetables over the past several days. She has attended several graduation parties and has eaten nothing but vegetables. She is resuming her normal diet and has no plans to continue eating this quantity of greens. We will boost her dose today, then resume her usual dosing. She will take Coumadin 10 mg today, then resume Coumadin 7.5mg  daily except 5 mg on Mondays. We will check PT/INR on 03/03/15 at 8:15 am for lab; 8:30 am for flush, 9am for treatment. We will see her during treatment.  Theone Murdoch, PharmD

## 2015-02-10 NOTE — Patient Instructions (Signed)

## 2015-02-10 NOTE — Patient Instructions (Signed)
[facility] Discharge Instructions for Patients Receiving Chemotherapy  Today you received the following chemotherapy agents:  Herceptin  To help prevent nausea and vomiting after your treatment, we encourage you to take your nausea medication as prescribed.   If you develop nausea and vomiting that is not controlled by your nausea medication, call the clinic.   BELOW ARE SYMPTOMS THAT SHOULD BE REPORTED IMMEDIATELY:  *FEVER GREATER THAN 100.5 F  *CHILLS WITH OR WITHOUT FEVER  NAUSEA AND VOMITING THAT IS NOT CONTROLLED WITH YOUR NAUSEA MEDICATION  *UNUSUAL SHORTNESS OF BREATH  *UNUSUAL BRUISING OR BLEEDING  TENDERNESS IN MOUTH AND THROAT WITH OR WITHOUT PRESENCE OF ULCERS  *URINARY PROBLEMS  *BOWEL PROBLEMS  UNUSUAL RASH Items with * indicate a potential emergency and should be followed up as soon as possible.  Feel free to call the clinic you have any questions or concerns. The clinic phone number is [phone].  Please show the CHEMO ALERT CARD at check-in to the Emergency Department and triage nurse.   

## 2015-02-24 ENCOUNTER — Other Ambulatory Visit: Payer: Self-pay | Admitting: Oncology

## 2015-02-24 NOTE — Telephone Encounter (Signed)
Verified refill ok with pharmacist/Lisa

## 2015-03-03 ENCOUNTER — Ambulatory Visit (HOSPITAL_BASED_OUTPATIENT_CLINIC_OR_DEPARTMENT_OTHER): Payer: Commercial Managed Care - HMO

## 2015-03-03 ENCOUNTER — Ambulatory Visit (HOSPITAL_BASED_OUTPATIENT_CLINIC_OR_DEPARTMENT_OTHER): Payer: Self-pay

## 2015-03-03 ENCOUNTER — Ambulatory Visit: Payer: Commercial Managed Care - HMO

## 2015-03-03 ENCOUNTER — Other Ambulatory Visit: Payer: Self-pay | Admitting: Oncology

## 2015-03-03 VITALS — BP 145/68 | HR 73 | Temp 98.6°F | Resp 18

## 2015-03-03 DIAGNOSIS — Z95828 Presence of other vascular implants and grafts: Secondary | ICD-10-CM

## 2015-03-03 DIAGNOSIS — C787 Secondary malignant neoplasm of liver and intrahepatic bile duct: Secondary | ICD-10-CM

## 2015-03-03 DIAGNOSIS — C50919 Malignant neoplasm of unspecified site of unspecified female breast: Secondary | ICD-10-CM

## 2015-03-03 DIAGNOSIS — C50811 Malignant neoplasm of overlapping sites of right female breast: Secondary | ICD-10-CM | POA: Diagnosis not present

## 2015-03-03 DIAGNOSIS — Z7901 Long term (current) use of anticoagulants: Secondary | ICD-10-CM

## 2015-03-03 DIAGNOSIS — I871 Compression of vein: Secondary | ICD-10-CM | POA: Diagnosis not present

## 2015-03-03 DIAGNOSIS — Z5112 Encounter for antineoplastic immunotherapy: Secondary | ICD-10-CM

## 2015-03-03 LAB — CBC WITH DIFFERENTIAL/PLATELET
BASO%: 1.1 % (ref 0.0–2.0)
Basophils Absolute: 0.1 10*3/uL (ref 0.0–0.1)
EOS%: 1.9 % (ref 0.0–7.0)
Eosinophils Absolute: 0.2 10*3/uL (ref 0.0–0.5)
HCT: 34.5 % — ABNORMAL LOW (ref 34.8–46.6)
HGB: 11.8 g/dL (ref 11.6–15.9)
LYMPH%: 31.4 % (ref 14.0–49.7)
MCH: 27.6 pg (ref 25.1–34.0)
MCHC: 34.3 g/dL (ref 31.5–36.0)
MCV: 80.5 fL (ref 79.5–101.0)
MONO#: 0.8 10*3/uL (ref 0.1–0.9)
MONO%: 8.4 % (ref 0.0–14.0)
NEUT%: 57.2 % (ref 38.4–76.8)
NEUTROS ABS: 5.3 10*3/uL (ref 1.5–6.5)
PLATELETS: 244 10*3/uL (ref 145–400)
RBC: 4.28 10*6/uL (ref 3.70–5.45)
RDW: 15.8 % — ABNORMAL HIGH (ref 11.2–14.5)
WBC: 9.2 10*3/uL (ref 3.9–10.3)
lymph#: 2.9 10*3/uL (ref 0.9–3.3)

## 2015-03-03 LAB — COMPREHENSIVE METABOLIC PANEL (CC13)
ALT: 15 U/L (ref 0–55)
ANION GAP: 8 meq/L (ref 3–11)
AST: 14 U/L (ref 5–34)
Albumin: 3.4 g/dL — ABNORMAL LOW (ref 3.5–5.0)
Alkaline Phosphatase: 99 U/L (ref 40–150)
BUN: 21.6 mg/dL (ref 7.0–26.0)
CALCIUM: 9.6 mg/dL (ref 8.4–10.4)
CHLORIDE: 109 meq/L (ref 98–109)
CO2: 24 meq/L (ref 22–29)
Creatinine: 1 mg/dL (ref 0.6–1.1)
EGFR: 73 mL/min/{1.73_m2} — AB (ref >90)
Glucose: 107 mg/dl (ref 70–140)
Potassium: 3.9 mEq/L (ref 3.5–5.1)
Sodium: 141 mEq/L (ref 136–145)
Total Bilirubin: 0.37 mg/dL (ref 0.20–1.20)
Total Protein: 7.9 g/dL (ref 6.4–8.3)

## 2015-03-03 LAB — PROTIME-INR
INR: 3.2 (ref 2.00–3.50)
Protime: 38.4 Seconds — ABNORMAL HIGH (ref 10.6–13.4)

## 2015-03-03 LAB — POCT INR: INR: 3.2

## 2015-03-03 MED ORDER — SODIUM CHLORIDE 0.9 % IV SOLN
Freq: Once | INTRAVENOUS | Status: AC
  Administered 2015-03-03: 09:00:00 via INTRAVENOUS

## 2015-03-03 MED ORDER — LORAZEPAM 2 MG/ML IJ SOLN
INTRAMUSCULAR | Status: AC
  Filled 2015-03-03: qty 1

## 2015-03-03 MED ORDER — SODIUM CHLORIDE 0.9 % IJ SOLN
10.0000 mL | INTRAMUSCULAR | Status: DC | PRN
  Administered 2015-03-03: 10 mL
  Filled 2015-03-03: qty 10

## 2015-03-03 MED ORDER — ACETAMINOPHEN 325 MG PO TABS
ORAL_TABLET | ORAL | Status: AC
  Filled 2015-03-03: qty 2

## 2015-03-03 MED ORDER — DIPHENHYDRAMINE HCL 25 MG PO CAPS
ORAL_CAPSULE | ORAL | Status: AC
  Filled 2015-03-03: qty 2

## 2015-03-03 MED ORDER — HEPARIN SOD (PORK) LOCK FLUSH 100 UNIT/ML IV SOLN
500.0000 [IU] | Freq: Once | INTRAVENOUS | Status: AC | PRN
  Administered 2015-03-03: 500 [IU]
  Filled 2015-03-03: qty 5

## 2015-03-03 MED ORDER — DIPHENHYDRAMINE HCL 25 MG PO CAPS
50.0000 mg | ORAL_CAPSULE | Freq: Once | ORAL | Status: AC
  Administered 2015-03-03: 50 mg via ORAL

## 2015-03-03 MED ORDER — TRASTUZUMAB CHEMO INJECTION 440 MG
6.0000 mg/kg | Freq: Once | INTRAVENOUS | Status: AC
  Administered 2015-03-03: 798 mg via INTRAVENOUS
  Filled 2015-03-03: qty 38

## 2015-03-03 MED ORDER — SODIUM CHLORIDE 0.9 % IJ SOLN
10.0000 mL | INTRAMUSCULAR | Status: DC | PRN
  Administered 2015-03-03: 10 mL via INTRAVENOUS
  Filled 2015-03-03: qty 10

## 2015-03-03 MED ORDER — LORAZEPAM 2 MG/ML IJ SOLN
1.0000 mg | Freq: Once | INTRAMUSCULAR | Status: AC | PRN
  Administered 2015-03-03: 1 mg via INTRAVENOUS

## 2015-03-03 MED ORDER — ACETAMINOPHEN 325 MG PO TABS
650.0000 mg | ORAL_TABLET | Freq: Once | ORAL | Status: AC
  Administered 2015-03-03: 650 mg via ORAL

## 2015-03-03 NOTE — Progress Notes (Signed)
*  Seen during infusion; no charge for encounter*  [name] INR is 3.2 today which is slightly above her goal range of 2-3.  She has been taking Coumadin 7.5 mg daily except 5 mg on Mondays. She reports no missed and/or extra doses. No changes in medications. No bleeding and/or extra bruising. She does report less green intake, and this is likely the explanation behind her higher INR. Her garden is starting to produce vegetables, and her intake of these will increase over the next few weeks, which will likely lower her INR some. In anticipation of her diet changes, we will not reduce her dose at this visit.  Plan: Continue Coumadin 7.5 mg daily except 5 mg on Mondays. Return for INR check on 03/24/15: lab at 8 am, flush at 8:15 am, appointment with [name] at 8:45 am, and infusion at 9:45 am - we will see her in infusion.

## 2015-03-03 NOTE — Patient Instructions (Signed)

## 2015-03-03 NOTE — Patient Instructions (Signed)
[facility] Discharge Instructions for Patients Receiving Chemotherapy  Today you received the following chemotherapy agents: Herceptin   To help prevent nausea and vomiting after your treatment, we encourage you to take your nausea medication as directed.    If you develop nausea and vomiting that is not controlled by your nausea medication, call the clinic.   BELOW ARE SYMPTOMS THAT SHOULD BE REPORTED IMMEDIATELY:  *FEVER GREATER THAN 100.5 F  *CHILLS WITH OR WITHOUT FEVER  NAUSEA AND VOMITING THAT IS NOT CONTROLLED WITH YOUR NAUSEA MEDICATION  *UNUSUAL SHORTNESS OF BREATH  *UNUSUAL BRUISING OR BLEEDING  TENDERNESS IN MOUTH AND THROAT WITH OR WITHOUT PRESENCE OF ULCERS  *URINARY PROBLEMS  *BOWEL PROBLEMS  UNUSUAL RASH Items with * indicate a potential emergency and should be followed up as soon as possible.  Feel free to call the clinic you have any questions or concerns. The clinic phone number is [phone].  Please show the CHEMO ALERT CARD at check-in to the Emergency Department and triage nurse.   

## 2015-03-07 ENCOUNTER — Other Ambulatory Visit: Payer: Self-pay | Admitting: Oncology

## 2015-03-07 ENCOUNTER — Other Ambulatory Visit: Payer: Self-pay | Admitting: *Deleted

## 2015-03-07 DIAGNOSIS — F419 Anxiety disorder, unspecified: Secondary | ICD-10-CM

## 2015-03-07 MED ORDER — ALPRAZOLAM 1 MG PO TABS: 1.0000 mg | ORAL_TABLET | Freq: Three times a day (TID) | ORAL | Status: DC | PRN

## 2015-03-21 ENCOUNTER — Other Ambulatory Visit: Payer: Self-pay | Admitting: Oncology

## 2015-03-21 ENCOUNTER — Other Ambulatory Visit: Payer: Self-pay | Admitting: *Deleted

## 2015-03-21 NOTE — Telephone Encounter (Signed)
Faxed prescription for restoril to [facility] in [place]

## 2015-03-21 NOTE — Telephone Encounter (Signed)
Last ov 03/24/15.  Next ov 12/20/14.  Chart reviewed.

## 2015-03-24 ENCOUNTER — Ambulatory Visit (HOSPITAL_BASED_OUTPATIENT_CLINIC_OR_DEPARTMENT_OTHER): Payer: Commercial Managed Care - HMO | Admitting: Nurse Practitioner

## 2015-03-24 ENCOUNTER — Other Ambulatory Visit: Payer: Commercial Managed Care - HMO

## 2015-03-24 ENCOUNTER — Telehealth: Payer: Self-pay | Admitting: Oncology

## 2015-03-24 ENCOUNTER — Encounter: Payer: Self-pay | Admitting: Nurse Practitioner

## 2015-03-24 ENCOUNTER — Telehealth: Payer: Self-pay | Admitting: Nurse Practitioner

## 2015-03-24 ENCOUNTER — Ambulatory Visit (HOSPITAL_BASED_OUTPATIENT_CLINIC_OR_DEPARTMENT_OTHER): Payer: Self-pay | Admitting: Pharmacist

## 2015-03-24 ENCOUNTER — Ambulatory Visit (HOSPITAL_BASED_OUTPATIENT_CLINIC_OR_DEPARTMENT_OTHER): Payer: Commercial Managed Care - HMO

## 2015-03-24 ENCOUNTER — Other Ambulatory Visit (HOSPITAL_BASED_OUTPATIENT_CLINIC_OR_DEPARTMENT_OTHER): Payer: Commercial Managed Care - HMO

## 2015-03-24 ENCOUNTER — Ambulatory Visit: Payer: Commercial Managed Care - HMO

## 2015-03-24 ENCOUNTER — Other Ambulatory Visit: Payer: Self-pay | Admitting: *Deleted

## 2015-03-24 VITALS — BP 131/73 | HR 63 | Temp 98.2°F | Resp 18 | Wt 301.8 lb

## 2015-03-24 DIAGNOSIS — C50911 Malignant neoplasm of unspecified site of right female breast: Secondary | ICD-10-CM | POA: Diagnosis not present

## 2015-03-24 DIAGNOSIS — R232 Flushing: Secondary | ICD-10-CM | POA: Insufficient documentation

## 2015-03-24 DIAGNOSIS — C7951 Secondary malignant neoplasm of bone: Secondary | ICD-10-CM

## 2015-03-24 DIAGNOSIS — Z7901 Long term (current) use of anticoagulants: Secondary | ICD-10-CM

## 2015-03-24 DIAGNOSIS — C50912 Malignant neoplasm of unspecified site of left female breast: Secondary | ICD-10-CM

## 2015-03-24 DIAGNOSIS — I871 Compression of vein: Secondary | ICD-10-CM

## 2015-03-24 DIAGNOSIS — Z95828 Presence of other vascular implants and grafts: Secondary | ICD-10-CM

## 2015-03-24 DIAGNOSIS — N951 Menopausal and female climacteric states: Secondary | ICD-10-CM

## 2015-03-24 DIAGNOSIS — C50919 Malignant neoplasm of unspecified site of unspecified female breast: Secondary | ICD-10-CM

## 2015-03-24 DIAGNOSIS — C787 Secondary malignant neoplasm of liver and intrahepatic bile duct: Secondary | ICD-10-CM

## 2015-03-24 DIAGNOSIS — Z5112 Encounter for antineoplastic immunotherapy: Secondary | ICD-10-CM

## 2015-03-24 DIAGNOSIS — Z171 Estrogen receptor negative status [ER-]: Secondary | ICD-10-CM

## 2015-03-24 LAB — CBC WITH DIFFERENTIAL/PLATELET
BASO%: 0.5 % (ref 0.0–2.0)
BASOS ABS: 0.1 10*3/uL (ref 0.0–0.1)
EOS%: 1.5 % (ref 0.0–7.0)
Eosinophils Absolute: 0.1 10*3/uL (ref 0.0–0.5)
HCT: 35 % (ref 34.8–46.6)
HEMOGLOBIN: 12 g/dL (ref 11.6–15.9)
LYMPH#: 2.6 10*3/uL (ref 0.9–3.3)
LYMPH%: 26.4 % (ref 14.0–49.7)
MCH: 27.8 pg (ref 25.1–34.0)
MCHC: 34.4 g/dL (ref 31.5–36.0)
MCV: 80.7 fL (ref 79.5–101.0)
MONO#: 0.6 10*3/uL (ref 0.1–0.9)
MONO%: 6.5 % (ref 0.0–14.0)
NEUT%: 65.1 % (ref 38.4–76.8)
NEUTROS ABS: 6.4 10*3/uL (ref 1.5–6.5)
Platelets: 260 10*3/uL (ref 145–400)
RBC: 4.33 10*6/uL (ref 3.70–5.45)
RDW: 15.7 % — ABNORMAL HIGH (ref 11.2–14.5)
WBC: 9.9 10*3/uL (ref 3.9–10.3)

## 2015-03-24 LAB — COMPREHENSIVE METABOLIC PANEL (CC13)
ALT: 11 U/L (ref 0–55)
AST: 13 U/L (ref 5–34)
Albumin: 3.5 g/dL (ref 3.5–5.0)
Alkaline Phosphatase: 98 U/L (ref 40–150)
Anion Gap: 8 mEq/L (ref 3–11)
BUN: 17.4 mg/dL (ref 7.0–26.0)
CALCIUM: 9.5 mg/dL (ref 8.4–10.4)
CHLORIDE: 107 meq/L (ref 98–109)
CO2: 26 mEq/L (ref 22–29)
CREATININE: 1 mg/dL (ref 0.6–1.1)
EGFR: 71 mL/min/{1.73_m2} — AB (ref >90)
Glucose: 105 mg/dl (ref 70–140)
Potassium: 4.2 mEq/L (ref 3.5–5.1)
SODIUM: 141 meq/L (ref 136–145)
Total Bilirubin: 0.59 mg/dL (ref 0.20–1.20)
Total Protein: 8 g/dL (ref 6.4–8.3)

## 2015-03-24 LAB — PROTIME-INR
INR: 1.7 — AB (ref 2.00–3.50)
Protime: 20.4 Seconds — ABNORMAL HIGH (ref 10.6–13.4)

## 2015-03-24 LAB — POCT INR: INR: 1.7

## 2015-03-24 MED ORDER — LORAZEPAM 2 MG/ML IJ SOLN
1.0000 mg | Freq: Once | INTRAMUSCULAR | Status: AC | PRN
  Administered 2015-03-24: 1 mg via INTRAVENOUS

## 2015-03-24 MED ORDER — POTASSIUM CHLORIDE CRYS ER 10 MEQ PO TBCR: 40.0000 meq | EXTENDED_RELEASE_TABLET | Freq: Every day | ORAL | Status: DC | PRN

## 2015-03-24 MED ORDER — ACETAMINOPHEN 325 MG PO TABS
650.0000 mg | ORAL_TABLET | Freq: Once | ORAL | Status: AC
  Administered 2015-03-24: 650 mg via ORAL

## 2015-03-24 MED ORDER — DIPHENHYDRAMINE HCL 25 MG PO CAPS
ORAL_CAPSULE | ORAL | Status: AC
  Filled 2015-03-24: qty 2

## 2015-03-24 MED ORDER — SODIUM CHLORIDE 0.9 % IV SOLN
6.0000 mg/kg | Freq: Once | INTRAVENOUS | Status: AC
  Administered 2015-03-24: 798 mg via INTRAVENOUS
  Filled 2015-03-24: qty 38

## 2015-03-24 MED ORDER — SODIUM CHLORIDE 0.9 % IJ SOLN
10.0000 mL | INTRAMUSCULAR | Status: DC | PRN
  Administered 2015-03-24: 10 mL via INTRAVENOUS
  Filled 2015-03-24: qty 10

## 2015-03-24 MED ORDER — DIPHENHYDRAMINE HCL 25 MG PO CAPS
50.0000 mg | ORAL_CAPSULE | Freq: Once | ORAL | Status: AC
  Administered 2015-03-24: 50 mg via ORAL

## 2015-03-24 MED ORDER — HEPARIN SOD (PORK) LOCK FLUSH 100 UNIT/ML IV SOLN
500.0000 [IU] | Freq: Once | INTRAVENOUS | Status: AC | PRN
  Administered 2015-03-24: 500 [IU]
  Filled 2015-03-24: qty 5

## 2015-03-24 MED ORDER — ALBUTEROL SULFATE HFA 108 (90 BASE) MCG/ACT IN AERS: 2.0000 | INHALATION_SPRAY | Freq: Four times a day (QID) | RESPIRATORY_TRACT | Status: DC | PRN

## 2015-03-24 MED ORDER — SODIUM CHLORIDE 0.9 % IJ SOLN
10.0000 mL | INTRAMUSCULAR | Status: DC | PRN
  Administered 2015-03-24: 10 mL
  Filled 2015-03-24: qty 10

## 2015-03-24 MED ORDER — VENLAFAXINE HCL ER 37.5 MG PO CP24: 37.5000 mg | ORAL_CAPSULE | Freq: Every day | ORAL | Status: DC

## 2015-03-24 MED ORDER — LORAZEPAM 2 MG/ML IJ SOLN
INTRAMUSCULAR | Status: AC
  Filled 2015-03-24: qty 1

## 2015-03-24 MED ORDER — ACETAMINOPHEN 325 MG PO TABS
ORAL_TABLET | ORAL | Status: AC
  Filled 2015-03-24: qty 2

## 2015-03-24 MED ORDER — SODIUM CHLORIDE 0.9 % IV SOLN
Freq: Once | INTRAVENOUS | Status: AC
  Administered 2015-03-24: 10:00:00 via INTRAVENOUS

## 2015-03-24 NOTE — Telephone Encounter (Signed)
Called patient and left a message with her echo/md visit with the heart clinic

## 2015-03-24 NOTE — Patient Instructions (Signed)
[facility] Discharge Instructions for Patients Receiving Chemotherapy  Today you received the following chemotherapy agents: Herceptin   To help prevent nausea and vomiting after your treatment, we encourage you to take your nausea medication as directed.    If you develop nausea and vomiting that is not controlled by your nausea medication, call the clinic.   BELOW ARE SYMPTOMS THAT SHOULD BE REPORTED IMMEDIATELY:  *FEVER GREATER THAN 100.5 F  *CHILLS WITH OR WITHOUT FEVER  NAUSEA AND VOMITING THAT IS NOT CONTROLLED WITH YOUR NAUSEA MEDICATION  *UNUSUAL SHORTNESS OF BREATH  *UNUSUAL BRUISING OR BLEEDING  TENDERNESS IN MOUTH AND THROAT WITH OR WITHOUT PRESENCE OF ULCERS  *URINARY PROBLEMS  *BOWEL PROBLEMS  UNUSUAL RASH Items with * indicate a potential emergency and should be followed up as soon as possible.  Feel free to call the clinic you have any questions or concerns. The clinic phone number is [phone].  Please show the CHEMO ALERT CARD at check-in to the Emergency Department and triage nurse.   

## 2015-03-24 NOTE — Progress Notes (Signed)
[place]  [phone] [phone]  OFFICE PROGRESS NOTE  ID: [name]   DOB: Apr 17, 1951  MR#: [id number]  YWV#:[id number]  PCP: Ernestene Kiel, MD GYN:  SU:  OTHER MD: Pierre Bali, Johnnette Gourd, Wendee Copp  CC: Metastatic HER-2 positive breast cancer.  CURRENT TREATMENT: Trastuzumab  BREAST CANCER HISTORY:   From the earlier summary:  [name] is 64 years old [name] female.  This woman has been in good health all of her life.  She noted a swelling and discomfort in her right breast in June 2004.  She was seen in the Emergency Room in [place] and was treated for mastitis.  She was treated for a number of months with mastitis and the swelling did not get better. She was given hydrocodone and Cipro.  Finally, the swelling did get better and ultimately the nipple became retracted and she noticed some dimpling in her skin. She had a mammogram in July of 2004 in [place] with subsequent mammogram on May 27, 2003, by Dr. Isaiah Blakes.  Mammogram done on September 30 showed marked increased density in the left breast.  Biopsy was performed the same day.  It was noted at the 12 o'clock position, deep in the breast was a focal hypoechoic mass, at least 3.5 cm in diameter.  Biopsy did in fact show invasive in situ mammary carcinoma. This was felt to be both at least intermediate, high grade.  No definite lymphovascular invasion was identified. ER and PR negative, Her2 testing positive. [name] continues to have pain in her breast.  She continues to take hydrocodone a number of times a day.  She has been seen by Dr. Rosana Hoes, who felt that neoadjuvant chemotherapy would be required.    Initial staging studies showed evidence of liver and lung mets.   Patient also has evidence of bone lesions. Patient started neoadjuvant chemotherapy, Taxotere/Carbo/Herceptin in October 2004.   Patient had a CT scan in December 2004 which  demonstrated extensive clot in the SVC innominate vein, bilateral jugular vein and  She was started on anticoagulation therapy. She received a total of 6 cycles of Taxotere/Carbo/Herceptin, completed in April 2005."  Patient has been on  trastuzumab continued indefinitely; has also received lapatinib and capecitabine for variable intervals in 2007-2008. Most recent echo 12/01/2013 showed an ejection fraction of 55%. She is status post bilateral mastectomies with bilateral axillary lymph node dissection 12/07/2004, showing (a) on the right, a mypT1c ypN1 invasive ductal carcinoma, grade 3, estrogen and progesterone receptor negative, HER-2 positive, with an MIB-1 of 31% (b) on the left, ypT2 ypN1 invasive ductal carcinoma, grade 2, estrogen and progesterone receptor negative, HER-2 positive, with an MIB-1 of 35%. She is Status post radiation June through July of 2006, to the right chest wall, left chest wall, bilateral supraclavicular fossae, and bilateral axillary boosts; with additional radiation to the right and left chest walls and the central chest wall completed November of 2007. She is status post ixempra x9 completed August of 2009. 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 Patient has chronic pain, with negative PET scan 08/24/2013 (no evidence of active cancer). On Neurontin and Tramadol therapy.  Her subsequent history is as detailed below  INTERVAL HISTORY: [name] returns today for followup of her stage IV breast cancer accompanied by her husband, [name]. She continues on trastuzumab every 3 weeks. She is tolerating this with no side effects that she  is aware of. Her most recent echocardiogram at the end of May showed a well preserved ejection fraction   REVIEW OF SYSTEMS: [name] denies any changes today. She has no fevers, chills, nausea, vomiting, or changes in bowel or bladder habits. She uses baclofen and  gabapentin for her chest wall spasms. She applies voltaren gel to her knees and shoulders and uses ibuprofen occasionally, though she knows this is risky while being on coumadin. She denies bruising or bleeding. She has some shortness of breath and wheezing and uses albuterol PRN. She denies chest pain, cough, or palpitations. She does not sleep well at night despite sleep aids. A detailed review of systems is otherwise stable.   PAST MEDICAL HISTORY: Past Medical History  Diagnosis Date  . Breast cancer     mets to liver and lung  . Hypertension   . SVC syndrome   . History of chemotherapy Feb. 2006    taxotere/herceptin/carboplatin  . Radiation 07/31/2006    left upper chest  . Radiation 06/17/2006-06/27/2006    6480 cGy bilat. chest wall  . Neuropathy   . Thrombosis   . Breast cancer metastasized to multiple sites 02/26/2013    PAST SURGICAL HISTORY: Past Surgical History  Procedure Laterality Date  . Tubal ligation  1986  . Cholecystectomy  1989  . Mastectomy Bilateral   . Ankle surgery    . Peripherally inserted central catheter insertion    . Back surgery      FAMILY HISTORY Family History  Problem Relation Age of Onset  . Heart failure Father   . Cancer Father     Prostate cancer  . Heart failure Brother   . Cancer Brother     Prostate cancer  . Diabetes Maternal Aunt    She had three brothers, one died of gunshot wound, one of complications of diabetes mellitus and one of myocardial infarction.  She has no sisters.  Mother died of complications of brain metastasis in 1.  Father has had a myocardial infarction in 1999.  No history of breast or ovarian cancer in the family.     GYNECOLOGIC HISTORY:   Menarche at age 44.  Gravida 3, para 3.  First live birth at age 21.  No history of breast feeding. No history of hormonal replacement therapy.   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 Her husband  used to work as a Art gallery manager, but is now retired. She has three children, [name] who lives in [place] and works as a Hydrographic surveyor, [name] who lives in [place] and works as a Administrator, and [name] who lives in [place] and also works as a Hydrographic surveyor. The patient has 12 grandchildren and 4 great-grandchildren. She attends a [employer]. Very involved with school kids.  ADVANCED DIRECTIVES:  Not in place  HEALTH MAINTENANCE: (Updated 06/12/2013) History  Substance Use Topics  . Smoking status: Never Smoker   . Smokeless tobacco: Never Used  . Alcohol Use: Yes     Comment: occasional     Colonoscopy: Never and "I don't want one"  PAP:  1987  Bone density:  Never  Lipid panel:  Not on file    Allergies  Allergen Reactions  . Penicillins Hives    PATIENT HAS TOLERATED CEPHALOSPORINGS  . Adhesive [Tape] Other (See Comments)    Tears skin     Current Outpatient Prescriptions  Medication Sig Dispense Refill  .  acetaminophen (TYLENOL) 500 MG tablet Take 1,000 mg by mouth every 6 (six) hours as needed for mild pain or fever.     . ALPRAZolam (XANAX) 1 MG tablet Take 1 tablet (1 mg total) by mouth 3 (three) times daily as needed. for anxiety 90 tablet 0  . amLODipine (NORVASC) 10 MG tablet Take 10 mg by mouth every morning.    . baclofen (LIORESAL) 10 MG tablet TAKE 1 TABLET BY MOUTH THREE TIMES DAILY AS NEEDED FOR MUSCLE SPASMS 270 tablet 3  . carvedilol (COREG) 3.125 MG tablet TAKE 1 TABLET BY MOUTH TWICE DAILY (Patient taking differently: TAKE 1 TABLET BY MOUTH Once a Day) 180 tablet 3  . furosemide (LASIX) 80 MG tablet Take 80 mg by mouth daily as needed for fluid.     Marland Kitchen gabapentin (NEURONTIN) 300 MG capsule TAKE 2 CAPSULES BY MOUTH THREE TIMES DAILY 180 capsule 2  . losartan (COZAAR) 100 MG tablet Take 100 mg by mouth every morning.    . potassium chloride (K-DUR,KLOR-CON) 10 MEQ tablet Take 4 tablets (40 mEq total) by mouth daily as needed. 120 tablet 5  .  spironolactone (ALDACTONE) 25 MG tablet Take 0.5 tablets (12.5 mg total) by mouth daily. 30 tablet 3  . temazepam (RESTORIL) 30 MG capsule TAKE 1 CAPSULE BY MOUTH EVERY DAY AT BEDTIME AS NEEDED FOR SLEEP 30 capsule 0  . warfarin (COUMADIN) 7.5 MG tablet 7.5 mg daily.    Marland Kitchen albuterol (PROVENTIL HFA;VENTOLIN HFA) 108 (90 BASE) MCG/ACT inhaler Inhale 2 puffs into the lungs every 6 (six) hours as needed for wheezing. 1 Inhaler 5  . diclofenac sodium (VOLTAREN) 1 % GEL Apply 2 g topically daily as needed (for pain). Apply to knees and shoulders (Patient not taking: Reported on 03/24/2015) 100 g 6  . traMADol (ULTRAM) 50 MG tablet Take 2 tablets (100 mg total) by mouth every 6 (six) hours as needed. (Patient not taking: Reported on 03/24/2015) 120 tablet 0  . venlafaxine XR (EFFEXOR-XR) 37.5 MG 24 hr capsule Take 1 capsule (37.5 mg total) by mouth daily with breakfast. 30 capsule 5   No current facility-administered medications for this visit.   Facility-Administered Medications Ordered in Other Visits  Medication Dose Route Frequency Provider Last Rate Last Dose  . heparin lock flush 100 unit/mL  500 Units Intracatheter Once PRN Chauncey Cruel, MD      . sodium chloride 0.9 % injection 10 mL  10 mL Intravenous PRN Chauncey Cruel, MD   10 mL at 12/17/13 1103  . sodium chloride 0.9 % injection 10 mL  10 mL Intracatheter PRN Chauncey Cruel, MD      . trastuzumab (HERCEPTIN) 798 mg in sodium chloride 0.9 % 250 mL chemo infusion  6 mg/kg (Order-Specific) Intravenous Once Chauncey Cruel, MD 576 mL/hr at 03/24/15 1025 798 mg at 03/24/15 1025    OBJECTIVE:  Middle aged African American woman in no acute distress Filed Vitals:   03/24/15 0852  BP: 131/73  Pulse: 63  Temp: 98.2 F (36.8 C)  Resp: 18     Body mass index is 53.48 kg/(m^2).    ECOG FS: 1 Filed Weights   03/24/15 0852  Weight: 301 lb 12.8 oz (136.896 kg)   Filed Weights   03/24/15 0852  Weight: 301 lb 12.8 oz (136.896 kg)    Skin: warm, dry  HEENT: sclerae anicteric, conjunctivae pink, oropharynx clear. No thrush or mucositis.  Lymph Nodes: No cervical or supraclavicular lymphadenopathy  Lungs:  clear to auscultation bilaterally, no rales, wheezes, or rhonci  Heart: regular rate and rhythm  Abdomen: round, soft, non tender, positive bowel sounds  Musculoskeletal: No focal spinal tenderness, no peripheral edema  Neuro: non focal, well oriented, positive affect  Breasts: bilateral breasts status post mastectomies. No evidence of recurrent disease to chest wall. Bilateral axillae benign.   LAB RESULTS: Lab Results  Component Value Date   WBC 9.9 03/24/2015   NEUTROABS 6.4 03/24/2015   HGB 12.0 03/24/2015   HCT 35.0 03/24/2015   MCV 80.7 03/24/2015   PLT 260 03/24/2015      Chemistry      Component Value Date/Time   NA 141 03/24/2015 0754   NA 139 12/09/2014 0940   K 4.2 03/24/2015 0754   K 3.9 12/09/2014 0940   CL 107 12/09/2014 0940   CL 104 01/30/2013 0850   CO2 26 03/24/2015 0754   CO2 25 12/09/2014 0940   BUN 17.4 03/24/2015 0754   BUN 16 12/09/2014 0940   CREATININE 1.0 03/24/2015 0754   CREATININE 0.75 12/09/2014 0940      Component Value Date/Time   CALCIUM 9.5 03/24/2015 0754   CALCIUM 8.8 12/09/2014 0940   ALKPHOS 98 03/24/2015 0754   ALKPHOS 94 12/09/2014 0940   AST 13 03/24/2015 0754   AST 20 12/09/2014 0940   ALT 11 03/24/2015 0754   ALT 20 12/09/2014 0940   BILITOT 0.59 03/24/2015 0754   BILITOT 0.4 12/09/2014 0940     STUDIES:  No results found.   ASSESSMENT: 64 y.o.  [place], [place], woman  (1)  with a history of inflammatory right breast cancer metastatic at presentation September 2004 with involvement of liver and bone, HER-2 positive, estrogen and progesterone receptor negative  (2) treated with carboplatin, docetaxel and Herceptin x 6 completed April 2005  (3) trastuzumab continued indefinitely;   (a) has also received lapatinib and capecitabine  for variable intervals in 2007-2008.  (b) Most recent echo 01/25/2015 showed an ejection fraction of 55-60%. To be performed every 4 months from this point.  (4) status post bilateral mastectomies with bilateral axillary lymph node dissection 12/07/2004, showing  (a) on the right, a mypT1c ypN1 invasive ductal carcinoma, grade 3, estrogen and progesterone receptor negative, HER-2 positive, with an MIB-1 of 31%  (b) on the left, ypT2 ypN1 invasive ductal carcinoma, grade 2, estrogen and progesterone receptor negative, HER-2 positive, with an MIB-1 of 35%.  (5)  Status post radiation June through July of 2006, to the right chest wall, left chest wall, bilateral supraclavicular fossae, and bilateral axillary boosts; with additional radiation to the right and left chest walls and the central chest wall completed November of 2007  (6) status post Ixempra x9 completed August of 2009.  (7) history of superior vena caval syndrome, on life long anticoagulation   (8)  History of chemotherapy-induced neuropathy.   (9)  chronic pain, with negative PET scan 08/24/2013 (no evidence of active cancer). On Neurontin and Tramadol  (10) right upper extremity cellulitis, no bacteremia; treated with cephalexin /doxycycline for 2 weeks, with resolution   PLAN: Jaana continues to do well as far as her breast cancer is concerned. She has no evidence of active disease at this point. She will proceed with her next dose of tratsuzumab today as planned and indefinitely until we have evidence of recurrence. She will have echocardiograms spread out to every 4 months now, so I have placed orders for her next one to be scheduled at  the end of September.   For her hot flashes we will given venlafaxine 37.78m a try. She understands that she must be consistent with use of this medicine, and that in the future should she decided to discontinue use, she will taper off of it slowly. She is already on gabapentin but does not find  this to be very effective.   Refills for her albuterol inhaler and potassium supplements were given today.  [name] will continue trastuzumab every 3 weeks with office visits every 3 months. She understands and agrees with this plan. She knows the goal of treatment in her case is control. She has been encouraged to call with any issues that might arise before her next visit here.   HLaurie Panda NP  03/24/2015 10:31 AM

## 2015-03-24 NOTE — Telephone Encounter (Signed)
Appointments made and avs will be printed in chemo,email to [facility] for echo precert

## 2015-03-24 NOTE — Patient Instructions (Signed)

## 2015-03-25 NOTE — Progress Notes (Signed)
INR slightly below goal today. CBC is wnl. Pt has been taking coumadin 7.5mg  daily (instead of 7.5mg  daily except 5mg  on Mondays). No missed coumadin doses. She has been eating more salads lately (spinach and romaine lettuce) and more fresh items from her garden. No problems or concerns regarding anticoagulation. No unusual bruising. No bleeding noted. No s/s of clotting noted. Take 10mg  today.  On 03/25/15, continue Coumadin 7.5mg  daily .   Will check PT/INR on 04/14/15 at 8:00 am for lab; 8:15 am for flush, 8:45 am for treatment and 9am for coumadin clinic. No charge encounter

## 2015-03-25 NOTE — Patient Instructions (Signed)
Take 10mg  today.  On 03/25/15, continue Coumadin 7.5mg  daily .   Will check PT/INR on 04/14/15 at 8:00 am for lab; 8:15 am for flush, 8:45 am for treatment and 9am for coumadin clinic.

## 2015-03-25 NOTE — Addendum Note (Signed)
Addended by: Neysa Hotter on: 03/25/2015 05:52 AM   Modules accepted: Medications

## 2015-03-30 ENCOUNTER — Other Ambulatory Visit: Payer: Self-pay | Admitting: Oncology

## 2015-04-04 ENCOUNTER — Other Ambulatory Visit: Payer: Self-pay | Admitting: Oncology

## 2015-04-05 ENCOUNTER — Other Ambulatory Visit: Payer: Self-pay | Admitting: *Deleted

## 2015-04-14 ENCOUNTER — Ambulatory Visit (HOSPITAL_BASED_OUTPATIENT_CLINIC_OR_DEPARTMENT_OTHER): Payer: Commercial Managed Care - HMO

## 2015-04-14 ENCOUNTER — Ambulatory Visit: Payer: Commercial Managed Care - HMO

## 2015-04-14 ENCOUNTER — Ambulatory Visit (HOSPITAL_BASED_OUTPATIENT_CLINIC_OR_DEPARTMENT_OTHER): Payer: Self-pay | Admitting: Pharmacist

## 2015-04-14 VITALS — BP 143/86 | HR 63 | Temp 98.6°F | Resp 20

## 2015-04-14 DIAGNOSIS — C50919 Malignant neoplasm of unspecified site of unspecified female breast: Secondary | ICD-10-CM

## 2015-04-14 DIAGNOSIS — C50912 Malignant neoplasm of unspecified site of left female breast: Secondary | ICD-10-CM

## 2015-04-14 DIAGNOSIS — Z95828 Presence of other vascular implants and grafts: Secondary | ICD-10-CM

## 2015-04-14 DIAGNOSIS — C787 Secondary malignant neoplasm of liver and intrahepatic bile duct: Secondary | ICD-10-CM

## 2015-04-14 DIAGNOSIS — I871 Compression of vein: Secondary | ICD-10-CM

## 2015-04-14 DIAGNOSIS — Z5112 Encounter for antineoplastic immunotherapy: Secondary | ICD-10-CM

## 2015-04-14 DIAGNOSIS — Z7901 Long term (current) use of anticoagulants: Secondary | ICD-10-CM

## 2015-04-14 DIAGNOSIS — C50911 Malignant neoplasm of unspecified site of right female breast: Secondary | ICD-10-CM

## 2015-04-14 LAB — CBC WITH DIFFERENTIAL/PLATELET
BASO%: 1.1 % (ref 0.0–2.0)
Basophils Absolute: 0.1 10*3/uL (ref 0.0–0.1)
EOS%: 1.4 % (ref 0.0–7.0)
Eosinophils Absolute: 0.1 10*3/uL (ref 0.0–0.5)
HCT: 35.9 % (ref 34.8–46.6)
HGB: 12.3 g/dL (ref 11.6–15.9)
LYMPH#: 2.4 10*3/uL (ref 0.9–3.3)
LYMPH%: 27 % (ref 14.0–49.7)
MCH: 27.5 pg (ref 25.1–34.0)
MCHC: 34.3 g/dL (ref 31.5–36.0)
MCV: 80.3 fL (ref 79.5–101.0)
MONO#: 0.6 10*3/uL (ref 0.1–0.9)
MONO%: 6.4 % (ref 0.0–14.0)
NEUT#: 5.6 10*3/uL (ref 1.5–6.5)
NEUT%: 64.1 % (ref 38.4–76.8)
Platelets: 257 10*3/uL (ref 145–400)
RBC: 4.47 10*6/uL (ref 3.70–5.45)
RDW: 15.5 % — AB (ref 11.2–14.5)
WBC: 8.8 10*3/uL (ref 3.9–10.3)

## 2015-04-14 LAB — COMPREHENSIVE METABOLIC PANEL (CC13)
ALBUMIN: 3.5 g/dL (ref 3.5–5.0)
ALT: 24 U/L (ref 0–55)
AST: 20 U/L (ref 5–34)
Alkaline Phosphatase: 90 U/L (ref 40–150)
Anion Gap: 8 mEq/L (ref 3–11)
BUN: 16.6 mg/dL (ref 7.0–26.0)
CALCIUM: 10 mg/dL (ref 8.4–10.4)
CO2: 27 mEq/L (ref 22–29)
Chloride: 108 mEq/L (ref 98–109)
Creatinine: 0.9 mg/dL (ref 0.6–1.1)
EGFR: 83 mL/min/{1.73_m2} — AB (ref >90)
Glucose: 115 mg/dl (ref 70–140)
Potassium: 4.3 mEq/L (ref 3.5–5.1)
Sodium: 142 mEq/L (ref 136–145)
Total Bilirubin: 0.52 mg/dL (ref 0.20–1.20)
Total Protein: 8 g/dL (ref 6.4–8.3)

## 2015-04-14 LAB — PROTIME-INR
INR: 3.6 — ABNORMAL HIGH (ref 2.00–3.50)
PROTIME: 43.2 s — AB (ref 10.6–13.4)

## 2015-04-14 LAB — POCT INR: INR: 3.6

## 2015-04-14 MED ORDER — SODIUM CHLORIDE 0.9 % IJ SOLN
10.0000 mL | INTRAMUSCULAR | Status: DC | PRN
  Administered 2015-04-14: 10 mL via INTRAVENOUS
  Filled 2015-04-14: qty 10

## 2015-04-14 MED ORDER — LORAZEPAM 2 MG/ML IJ SOLN
INTRAMUSCULAR | Status: AC
  Filled 2015-04-14: qty 1

## 2015-04-14 MED ORDER — TRASTUZUMAB CHEMO INJECTION 440 MG
6.0000 mg/kg | Freq: Once | INTRAVENOUS | Status: AC
  Administered 2015-04-14: 798 mg via INTRAVENOUS
  Filled 2015-04-14: qty 38

## 2015-04-14 MED ORDER — SODIUM CHLORIDE 0.9 % IV SOLN
Freq: Once | INTRAVENOUS | Status: AC
  Administered 2015-04-14: 10:00:00 via INTRAVENOUS

## 2015-04-14 MED ORDER — HEPARIN SOD (PORK) LOCK FLUSH 100 UNIT/ML IV SOLN
500.0000 [IU] | Freq: Once | INTRAVENOUS | Status: AC | PRN
  Administered 2015-04-14: 500 [IU]
  Filled 2015-04-14: qty 5

## 2015-04-14 MED ORDER — LORAZEPAM 2 MG/ML IJ SOLN
1.0000 mg | Freq: Once | INTRAMUSCULAR | Status: AC | PRN
  Administered 2015-04-14: 0.5 mg via INTRAVENOUS

## 2015-04-14 MED ORDER — SODIUM CHLORIDE 0.9 % IJ SOLN
10.0000 mL | INTRAMUSCULAR | Status: DC | PRN
  Administered 2015-04-14: 10 mL
  Filled 2015-04-14: qty 10

## 2015-04-14 MED ORDER — DIPHENHYDRAMINE HCL 25 MG PO CAPS
50.0000 mg | ORAL_CAPSULE | Freq: Once | ORAL | Status: AC
  Administered 2015-04-14: 50 mg via ORAL

## 2015-04-14 MED ORDER — WARFARIN SODIUM 7.5 MG PO TABS: 7.5000 mg | ORAL_TABLET | Freq: Every day | ORAL | Status: DC

## 2015-04-14 MED ORDER — DIPHENHYDRAMINE HCL 25 MG PO CAPS
ORAL_CAPSULE | ORAL | Status: AC
  Filled 2015-04-14: qty 2

## 2015-04-14 MED ORDER — ACETAMINOPHEN 325 MG PO TABS
650.0000 mg | ORAL_TABLET | Freq: Once | ORAL | Status: AC
  Administered 2015-04-14: 650 mg via ORAL

## 2015-04-14 MED ORDER — ACETAMINOPHEN 325 MG PO TABS
ORAL_TABLET | ORAL | Status: AC
  Filled 2015-04-14: qty 2

## 2015-04-14 NOTE — Progress Notes (Signed)
Pt seen during her infusion INR=3.6 on 7.5 mg daily No changes to report A few more greens with garden in full "bloom" Some OTC APAP No s/s bleeding She rec'd letter and I informed her of the transition We will see during infusion on 9/8 and 9/29 as her f/u appmt with Nira Conn is 10/20  Take 5mg  today, then continue Coumadin 7.5mg  daily .   Will check PT/INR on 05/05/15 at 8:15 am for lab; 8:30 am for flush, 9:15 am for treatment and 9:30am for coumadin clinic.   We will see you during your infusion

## 2015-04-14 NOTE — Patient Instructions (Signed)
[facility] Discharge Instructions for Patients Receiving Chemotherapy  Today you received the following chemotherapy agents Herceptin  To help prevent nausea and vomiting after your treatment, we encourage you to take your nausea medication    If you develop nausea and vomiting that is not controlled by your nausea medication, call the clinic.   BELOW ARE SYMPTOMS THAT SHOULD BE REPORTED IMMEDIATELY:  *FEVER GREATER THAN 100.5 F  *CHILLS WITH OR WITHOUT FEVER  NAUSEA AND VOMITING THAT IS NOT CONTROLLED WITH YOUR NAUSEA MEDICATION  *UNUSUAL SHORTNESS OF BREATH  *UNUSUAL BRUISING OR BLEEDING  TENDERNESS IN MOUTH AND THROAT WITH OR WITHOUT PRESENCE OF ULCERS  *URINARY PROBLEMS  *BOWEL PROBLEMS  UNUSUAL RASH Items with * indicate a potential emergency and should be followed up as soon as possible.  Feel free to call the clinic you have any questions or concerns. The clinic phone number is [phone].  Please show the CHEMO ALERT CARD at check-in to the Emergency Department and triage nurse.   

## 2015-04-14 NOTE — Patient Instructions (Signed)
Take 5mg  today, then continue Coumadin 7.5mg  daily .  Will check PT/INR on 05/05/15 at 8:15 am for lab; 8:30 am for flush, 9:15 am for treatment and 9:30am for coumadin clinic.  We will see you during your infusion

## 2015-04-21 ENCOUNTER — Ambulatory Visit (HOSPITAL_COMMUNITY)
Admission: RE | Admit: 2015-04-21 | Discharge: 2015-04-21 | Disposition: A | Discharge status: Home / Self Care | Payer: Commercial Managed Care - HMO | Source: Ambulatory Visit | Attending: Cardiology | Admitting: Cardiology

## 2015-04-21 ENCOUNTER — Ambulatory Visit (HOSPITAL_BASED_OUTPATIENT_CLINIC_OR_DEPARTMENT_OTHER)
Admission: RE | Admit: 2015-04-21 | Discharge: 2015-04-21 | Disposition: A | Discharge status: Home / Self Care | Payer: Commercial Managed Care - HMO | Source: Ambulatory Visit | Attending: Cardiology | Admitting: Cardiology

## 2015-04-21 VITALS — BP 132/76 | HR 70 | Wt 306.8 lb

## 2015-04-21 DIAGNOSIS — I340 Nonrheumatic mitral (valve) insufficiency: Secondary | ICD-10-CM | POA: Diagnosis not present

## 2015-04-21 DIAGNOSIS — C799 Secondary malignant neoplasm of unspecified site: Secondary | ICD-10-CM | POA: Diagnosis not present

## 2015-04-21 DIAGNOSIS — I517 Cardiomegaly: Secondary | ICD-10-CM | POA: Insufficient documentation

## 2015-04-21 DIAGNOSIS — C50919 Malignant neoplasm of unspecified site of unspecified female breast: Secondary | ICD-10-CM

## 2015-04-21 DIAGNOSIS — I77819 Aortic ectasia, unspecified site: Secondary | ICD-10-CM | POA: Insufficient documentation

## 2015-04-21 NOTE — Progress Notes (Signed)
  Echocardiogram 2D Echocardiogram has been performed.  [name] 04/21/2015, 12:14 PM

## 2015-04-21 NOTE — Patient Instructions (Signed)
We will contact you in 3 months to schedule your next appointment and echocardiogram  

## 2015-04-22 NOTE — Progress Notes (Signed)
Patient ID: [name], female   DOB: Apr 14, 1951, 64 y.o.   MRN: [id number] Oncologist: Dr Jana Hakim  HPI: [name] is a 64 year old with a history of metastatic HER-2 positive breast carcinoma originally diagnosed September 2004. Started in R breast. Underwent neo-adjuvant chemo. During this time developed L breast CA. Underwent bilateral mastectomy. Lymph nodes +. 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   She received a total of 6 cycles of Taxotere/Carbo/Herceptin, completed in April 2005, after which she began single agent Herceptin, given every 3 weeks. Plan to continue on Herceptin indefinitely.   She is symptomatically stable.  Arthritis limits exercise.   Labs (11/15): K 4, creatinine 1.0   Labs (1/16): K 4.1, creatinine 1.0 Labs (8/16): K 4.3, creatinine 0.9  04/23/12 ECHO EF 60-65% Lateral s' 8.9 cm/s  07/30/12 ECHO EF 60-65% Lateral s' 8.3 cm/s  09/11/12 ECHO EF 60-65% Lateral s' 10.3 cm/s  12/22/12 ECHO 55-60% Lateral S' 9.8 RV mildly dilated  07/01/13 ECHO 55-60%, lateral s' 9.79, mild RV dilation, grade II DD 3/15 ECHO 55%, mild MR, lateral s' 9.6, GLS -19.2% 03/02/14 ECHO EF 55-60% Lateral S' 9.4 GLS - 21.9  11/15 ECHO EF 60-65%, lateral S' 6.8, GLS -17.2%, mild RV dilation with normal systolic function, mild MR.  2/16 ECHO EF 60-65%, lateral S' 14.4, GLS -84.1%, normal diastolic function, mild RV dilation with normal RV systolic function.  8/16 ECHO EF 60-65%, mild LVH, normal RV size and systolic function, lateral s' 13.2, GLS -17%.   ROS: All systems negative except as listed in HPI, PMH and Problem List.  Past Medical History  Diagnosis Date  . Breast cancer     mets to liver and lung  . Hypertension   . SVC syndrome   . History of chemotherapy Feb. 2006    taxotere/herceptin/carboplatin  . Radiation 07/31/2006    left upper chest  . Radiation 06/17/2006-06/27/2006    6480 cGy bilat. chest wall  . Neuropathy   .  Thrombosis   . Breast cancer metastasized to multiple sites 02/26/2013    Current Outpatient Prescriptions  Medication Sig Dispense Refill  . acetaminophen (TYLENOL) 500 MG tablet Take 1,000 mg by mouth every 6 (six) hours as needed for mild pain or fever.     Marland Kitchen albuterol (PROVENTIL HFA;VENTOLIN HFA) 108 (90 BASE) MCG/ACT inhaler Inhale 2 puffs into the lungs every 6 (six) hours as needed for wheezing. 1 Inhaler 5  . ALPRAZolam (XANAX) 1 MG tablet TAKE 1 TABLET BY MOUTH THREE TIMES DAILY AS NEEDED FOR ANXIETY 60 tablet 1  . amLODipine (NORVASC) 10 MG tablet Take 10 mg by mouth every morning.    . baclofen (LIORESAL) 10 MG tablet TAKE 1 TABLET BY MOUTH THREE TIMES DAILY AS NEEDED FOR MUSCLE SPASMS 270 tablet 3  . carvedilol (COREG) 3.125 MG tablet TAKE 1 TABLET BY MOUTH TWICE DAILY 180 tablet 3  . furosemide (LASIX) 80 MG tablet Take 80 mg by mouth daily as needed for fluid.     Marland Kitchen gabapentin (NEURONTIN) 300 MG capsule TAKE 2 CAPSULES BY MOUTH THREE TIMES DAILY 180 capsule 0  . HYDROcodone-acetaminophen (NORCO) 10-325 MG per tablet TAKE 1/2 TO 1 TABLET PO EVERY 6 HOURS PRN FOR PAIN  0  . losartan (COZAAR) 100 MG tablet Take 100 mg by mouth every morning.    . potassium chloride (K-DUR,KLOR-CON) 10 MEQ tablet Take 4 tablets (40 mEq total) by mouth daily as needed. 120 tablet 5  .  spironolactone (ALDACTONE) 25 MG tablet Take 0.5 tablets (12.5 mg total) by mouth daily. 30 tablet 3  . temazepam (RESTORIL) 30 MG capsule TAKE 1 CAPSULE BY MOUTH EVERY DAY AT BEDTIME AS NEEDED FOR SLEEP 30 capsule 0  . venlafaxine XR (EFFEXOR-XR) 37.5 MG 24 hr capsule Take 1 capsule (37.5 mg total) by mouth daily with breakfast. 30 capsule 5  . warfarin (COUMADIN) 7.5 MG tablet Take 1 tablet (7.5 mg total) by mouth daily. 30 tablet 6  . diclofenac sodium (VOLTAREN) 1 % GEL Apply 2 g topically daily as needed (for pain). Apply to knees and shoulders (Patient not taking: Reported on 03/24/2015) 100 g 6   No current  facility-administered medications for this encounter.   Facility-Administered Medications Ordered in Other Encounters  Medication Dose Route Frequency Provider Last Rate Last Dose  . sodium chloride 0.9 % injection 10 mL  10 mL Intravenous PRN Chauncey Cruel, MD   10 mL at 12/17/13 1103    Filed Vitals:   04/21/15 1211  BP: 132/76  Pulse: 70  Weight: 306 lb 12 oz (139.141 kg)  SpO2: 94%    PHYSICAL EXAM: General: NAD, obese. HEENT: normal  Neck: Thick. no JVD difficult to assess d/t body habitus but does not appear elevated; Carotids 2+ bilat; no bruits. No lymphadenopathy or thryomegaly appreciated.  Cor: PMI nondisplaced. Regular rate & rhythm. No rubs, gallops or murmurs. S/P bilateral mastectomies  Lungs: clear  Abdomen: obese. soft, nontender, nondistended. Good bowel sounds.  Extremities: no cyanosis, clubbing, rash.  No edema.  Neuro: alert & oriented x 3, cranial nerves grossly intact. moves all 4 extremities w/o difficulty. Affect pleasant.  ASSESSMENT & PLAN: 1) L Breast Cancer s/p bilateral mastectomies:  Symptomatically stable.  Echo today was stable.  She can continue Herceptin.  She will also continue losartan and Coreg.  Followup in 3 months for echo and office visit.  2) Suspected OSA: Patient has thick neck, mild RV dilation, snores at night.  Prefers to hold on sleep study.   3) HTN: Stable.   Loralie Champagne 04/22/2015

## 2015-04-28 ENCOUNTER — Other Ambulatory Visit: Payer: Self-pay | Admitting: Oncology

## 2015-05-05 ENCOUNTER — Ambulatory Visit (HOSPITAL_BASED_OUTPATIENT_CLINIC_OR_DEPARTMENT_OTHER): Payer: Self-pay | Admitting: Pharmacist

## 2015-05-05 ENCOUNTER — Other Ambulatory Visit: Payer: Self-pay

## 2015-05-05 ENCOUNTER — Ambulatory Visit: Payer: Commercial Managed Care - HMO

## 2015-05-05 ENCOUNTER — Other Ambulatory Visit (HOSPITAL_BASED_OUTPATIENT_CLINIC_OR_DEPARTMENT_OTHER): Payer: Commercial Managed Care - HMO

## 2015-05-05 ENCOUNTER — Ambulatory Visit (HOSPITAL_BASED_OUTPATIENT_CLINIC_OR_DEPARTMENT_OTHER): Payer: Commercial Managed Care - HMO

## 2015-05-05 VITALS — BP 161/76 | HR 67 | Temp 98.2°F | Resp 16

## 2015-05-05 DIAGNOSIS — Z5112 Encounter for antineoplastic immunotherapy: Secondary | ICD-10-CM

## 2015-05-05 DIAGNOSIS — C50911 Malignant neoplasm of unspecified site of right female breast: Secondary | ICD-10-CM

## 2015-05-05 DIAGNOSIS — Z7901 Long term (current) use of anticoagulants: Secondary | ICD-10-CM

## 2015-05-05 DIAGNOSIS — I871 Compression of vein: Secondary | ICD-10-CM

## 2015-05-05 DIAGNOSIS — Z95828 Presence of other vascular implants and grafts: Secondary | ICD-10-CM

## 2015-05-05 DIAGNOSIS — C50919 Malignant neoplasm of unspecified site of unspecified female breast: Secondary | ICD-10-CM

## 2015-05-05 DIAGNOSIS — C787 Secondary malignant neoplasm of liver and intrahepatic bile duct: Secondary | ICD-10-CM | POA: Diagnosis not present

## 2015-05-05 DIAGNOSIS — C50912 Malignant neoplasm of unspecified site of left female breast: Secondary | ICD-10-CM | POA: Diagnosis not present

## 2015-05-05 LAB — COMPREHENSIVE METABOLIC PANEL (CC13)
ALK PHOS: 90 U/L (ref 40–150)
ALT: 15 U/L (ref 0–55)
AST: 14 U/L (ref 5–34)
Albumin: 3.4 g/dL — ABNORMAL LOW (ref 3.5–5.0)
Anion Gap: 10 mEq/L (ref 3–11)
BUN: 14.4 mg/dL (ref 7.0–26.0)
CALCIUM: 9.2 mg/dL (ref 8.4–10.4)
CHLORIDE: 108 meq/L (ref 98–109)
CO2: 24 mEq/L (ref 22–29)
Creatinine: 0.7 mg/dL (ref 0.6–1.1)
GLUCOSE: 118 mg/dL (ref 70–140)
POTASSIUM: 3.8 meq/L (ref 3.5–5.1)
SODIUM: 142 meq/L (ref 136–145)
Total Bilirubin: 0.58 mg/dL (ref 0.20–1.20)
Total Protein: 7.8 g/dL (ref 6.4–8.3)

## 2015-05-05 LAB — CBC WITH DIFFERENTIAL/PLATELET
BASO%: 0.5 % (ref 0.0–2.0)
Basophils Absolute: 0 10*3/uL (ref 0.0–0.1)
EOS%: 2.7 % (ref 0.0–7.0)
Eosinophils Absolute: 0.2 10*3/uL (ref 0.0–0.5)
HCT: 34.7 % — ABNORMAL LOW (ref 34.8–46.6)
HEMOGLOBIN: 11.9 g/dL (ref 11.6–15.9)
LYMPH%: 32.7 % (ref 14.0–49.7)
MCH: 27.4 pg (ref 25.1–34.0)
MCHC: 34.3 g/dL (ref 31.5–36.0)
MCV: 79.8 fL (ref 79.5–101.0)
MONO#: 0.5 10*3/uL (ref 0.1–0.9)
MONO%: 8.1 % (ref 0.0–14.0)
NEUT%: 56 % (ref 38.4–76.8)
NEUTROS ABS: 3.7 10*3/uL (ref 1.5–6.5)
Platelets: 235 10*3/uL (ref 145–400)
RBC: 4.35 10*6/uL (ref 3.70–5.45)
RDW: 15.5 % — AB (ref 11.2–14.5)
WBC: 6.6 10*3/uL (ref 3.9–10.3)
lymph#: 2.2 10*3/uL (ref 0.9–3.3)

## 2015-05-05 LAB — POCT INR: INR: 1.5

## 2015-05-05 LAB — PROTIME-INR
INR: 1.5 — AB (ref 2.00–3.50)
PROTIME: 18 s — AB (ref 10.6–13.4)

## 2015-05-05 MED ORDER — DIPHENHYDRAMINE HCL 25 MG PO CAPS
50.0000 mg | ORAL_CAPSULE | Freq: Once | ORAL | Status: AC
  Administered 2015-05-05: 50 mg via ORAL

## 2015-05-05 MED ORDER — ACETAMINOPHEN 325 MG PO TABS
ORAL_TABLET | ORAL | Status: AC
  Filled 2015-05-05: qty 2

## 2015-05-05 MED ORDER — TRASTUZUMAB CHEMO INJECTION 440 MG
6.0000 mg/kg | Freq: Once | INTRAVENOUS | Status: AC
  Administered 2015-05-05: 798 mg via INTRAVENOUS
  Filled 2015-05-05: qty 38

## 2015-05-05 MED ORDER — LORAZEPAM 2 MG/ML IJ SOLN
INTRAMUSCULAR | Status: AC
  Filled 2015-05-05: qty 1

## 2015-05-05 MED ORDER — BACLOFEN 10 MG PO TABS: 10.0000 mg | ORAL_TABLET | Freq: Three times a day (TID) | ORAL | Status: DC | PRN

## 2015-05-05 MED ORDER — SODIUM CHLORIDE 0.9 % IJ SOLN
10.0000 mL | INTRAMUSCULAR | Status: DC | PRN
  Administered 2015-05-05: 10 mL via INTRAVENOUS
  Filled 2015-05-05: qty 10

## 2015-05-05 MED ORDER — HEPARIN SOD (PORK) LOCK FLUSH 100 UNIT/ML IV SOLN
500.0000 [IU] | Freq: Once | INTRAVENOUS | Status: AC | PRN
  Administered 2015-05-05: 500 [IU]
  Filled 2015-05-05: qty 5

## 2015-05-05 MED ORDER — LORAZEPAM 2 MG/ML IJ SOLN
1.0000 mg | Freq: Once | INTRAMUSCULAR | Status: AC | PRN
  Administered 2015-05-05: 1 mg via INTRAVENOUS

## 2015-05-05 MED ORDER — ACETAMINOPHEN 325 MG PO TABS
650.0000 mg | ORAL_TABLET | Freq: Once | ORAL | Status: AC
  Administered 2015-05-05: 650 mg via ORAL

## 2015-05-05 MED ORDER — SODIUM CHLORIDE 0.9 % IV SOLN
Freq: Once | INTRAVENOUS | Status: AC
  Administered 2015-05-05: 09:00:00 via INTRAVENOUS

## 2015-05-05 MED ORDER — DIPHENHYDRAMINE HCL 25 MG PO CAPS
ORAL_CAPSULE | ORAL | Status: AC
  Filled 2015-05-05: qty 2

## 2015-05-05 MED ORDER — TEMAZEPAM 30 MG PO CAPS: 30.0000 mg | ORAL_CAPSULE | Freq: Every evening | ORAL | Status: DC | PRN

## 2015-05-05 MED ORDER — SODIUM CHLORIDE 0.9 % IJ SOLN
10.0000 mL | INTRAMUSCULAR | Status: DC | PRN
  Administered 2015-05-05: 10 mL
  Filled 2015-05-05: qty 10

## 2015-05-05 NOTE — Patient Instructions (Signed)

## 2015-05-05 NOTE — Patient Instructions (Signed)
[facility] Discharge Instructions for Patients Receiving Chemotherapy  Today you received the following chemotherapy agents herceptin  To help prevent nausea and vomiting after your treatment, we encourage you to take your nausea medication  If you develop nausea and vomiting that is not controlled by your nausea medication, call the clinic.   BELOW ARE SYMPTOMS THAT SHOULD BE REPORTED IMMEDIATELY:  *FEVER GREATER THAN 100.5 F  *CHILLS WITH OR WITHOUT FEVER  NAUSEA AND VOMITING THAT IS NOT CONTROLLED WITH YOUR NAUSEA MEDICATION  *UNUSUAL SHORTNESS OF BREATH  *UNUSUAL BRUISING OR BLEEDING  TENDERNESS IN MOUTH AND THROAT WITH OR WITHOUT PRESENCE OF ULCERS  *URINARY PROBLEMS  *BOWEL PROBLEMS  UNUSUAL RASH Items with * indicate a potential emergency and should be followed up as soon as possible.  Feel free to call the clinic you have any questions or concerns. The clinic phone number is [phone].  Please show the CHEMO ALERT CARD at check-in to the Emergency Department and triage nurse.   

## 2015-05-05 NOTE — Progress Notes (Signed)
INR = 1.5 Pt has taken 5 mg/day since last visit.  No complaints re: anticoag. She understands that from this point forward, her INR's will be managed by Dr. Jana Hakim - starting 05/26/15 when she is back for another infusion. I advised her to take Coumadin 10 mg x 1 today only then 7.5 mg/day. Final anticoag appt w/ [facility] Coumadin clinic.  It has been a pleasure getting to know Mrs Szczerba and her husband. Kennith Center, Pharm.D., CPP 05/05/2015@9 :01 AM

## 2015-05-10 ENCOUNTER — Other Ambulatory Visit (HOSPITAL_COMMUNITY): Payer: Self-pay | Admitting: Cardiology

## 2015-05-22 ENCOUNTER — Other Ambulatory Visit: Payer: Self-pay | Admitting: Oncology

## 2015-05-26 ENCOUNTER — Encounter: Payer: Self-pay | Admitting: *Deleted

## 2015-05-26 ENCOUNTER — Ambulatory Visit: Payer: Commercial Managed Care - HMO

## 2015-05-26 ENCOUNTER — Other Ambulatory Visit (HOSPITAL_BASED_OUTPATIENT_CLINIC_OR_DEPARTMENT_OTHER): Payer: Commercial Managed Care - HMO

## 2015-05-26 ENCOUNTER — Other Ambulatory Visit (HOSPITAL_COMMUNITY)
Admission: RE | Admit: 2015-05-26 | Discharge: 2015-05-26 | Disposition: A | Discharge status: Home / Self Care | Payer: Commercial Managed Care - HMO | Source: Ambulatory Visit | Attending: Oncology | Admitting: Oncology

## 2015-05-26 ENCOUNTER — Telehealth: Payer: Self-pay | Admitting: Oncology

## 2015-05-26 ENCOUNTER — Other Ambulatory Visit: Payer: Self-pay

## 2015-05-26 ENCOUNTER — Ambulatory Visit (HOSPITAL_BASED_OUTPATIENT_CLINIC_OR_DEPARTMENT_OTHER): Payer: Commercial Managed Care - HMO

## 2015-05-26 VITALS — BP 155/80 | HR 60 | Temp 98.1°F | Resp 20

## 2015-05-26 DIAGNOSIS — C787 Secondary malignant neoplasm of liver and intrahepatic bile duct: Secondary | ICD-10-CM

## 2015-05-26 DIAGNOSIS — C50919 Malignant neoplasm of unspecified site of unspecified female breast: Secondary | ICD-10-CM

## 2015-05-26 DIAGNOSIS — C50912 Malignant neoplasm of unspecified site of left female breast: Secondary | ICD-10-CM

## 2015-05-26 DIAGNOSIS — Z95828 Presence of other vascular implants and grafts: Secondary | ICD-10-CM

## 2015-05-26 DIAGNOSIS — Z5112 Encounter for antineoplastic immunotherapy: Secondary | ICD-10-CM | POA: Diagnosis not present

## 2015-05-26 DIAGNOSIS — C50911 Malignant neoplasm of unspecified site of right female breast: Secondary | ICD-10-CM

## 2015-05-26 DIAGNOSIS — F419 Anxiety disorder, unspecified: Secondary | ICD-10-CM

## 2015-05-26 DIAGNOSIS — I82409 Acute embolism and thrombosis of unspecified deep veins of unspecified lower extremity: Secondary | ICD-10-CM

## 2015-05-26 LAB — CBC WITH DIFFERENTIAL/PLATELET
BASO%: 0.2 % (ref 0.0–2.0)
Basophils Absolute: 0 10*3/uL (ref 0.0–0.1)
EOS%: 3 % (ref 0.0–7.0)
Eosinophils Absolute: 0.3 10*3/uL (ref 0.0–0.5)
HCT: 34 % — ABNORMAL LOW (ref 34.8–46.6)
HGB: 11.8 g/dL (ref 11.6–15.9)
LYMPH%: 33 % (ref 14.0–49.7)
MCH: 27.6 pg (ref 25.1–34.0)
MCHC: 34.7 g/dL (ref 31.5–36.0)
MCV: 79.6 fL (ref 79.5–101.0)
MONO#: 0.6 10*3/uL (ref 0.1–0.9)
MONO%: 7 % (ref 0.0–14.0)
NEUT%: 56.8 % (ref 38.4–76.8)
NEUTROS ABS: 4.7 10*3/uL (ref 1.5–6.5)
PLATELETS: 263 10*3/uL (ref 145–400)
RBC: 4.27 10*6/uL (ref 3.70–5.45)
RDW: 15.9 % — ABNORMAL HIGH (ref 11.2–14.5)
WBC: 8.3 10*3/uL (ref 3.9–10.3)
lymph#: 2.7 10*3/uL (ref 0.9–3.3)

## 2015-05-26 LAB — COMPREHENSIVE METABOLIC PANEL (CC13)
ALT: 13 U/L (ref 0–55)
ANION GAP: 6 meq/L (ref 3–11)
AST: 14 U/L (ref 5–34)
Albumin: 3.4 g/dL — ABNORMAL LOW (ref 3.5–5.0)
Alkaline Phosphatase: 97 U/L (ref 40–150)
BUN: 16.9 mg/dL (ref 7.0–26.0)
CO2: 25 meq/L (ref 22–29)
Calcium: 9.1 mg/dL (ref 8.4–10.4)
Chloride: 108 mEq/L (ref 98–109)
Creatinine: 0.8 mg/dL (ref 0.6–1.1)
GLUCOSE: 99 mg/dL (ref 70–140)
POTASSIUM: 4.2 meq/L (ref 3.5–5.1)
SODIUM: 139 meq/L (ref 136–145)
Total Bilirubin: 0.5 mg/dL (ref 0.20–1.20)
Total Protein: 7.7 g/dL (ref 6.4–8.3)

## 2015-05-26 LAB — PROTIME-INR
INR: 5.1 — AB (ref 0.00–1.49)
Prothrombin Time: 45.6 seconds — ABNORMAL HIGH (ref 11.6–15.2)

## 2015-05-26 MED ORDER — SODIUM CHLORIDE 0.9 % IJ SOLN
10.0000 mL | INTRAMUSCULAR | Status: DC | PRN
  Administered 2015-05-26: 10 mL via INTRAVENOUS
  Filled 2015-05-26: qty 10

## 2015-05-26 MED ORDER — SODIUM CHLORIDE 0.9 % IJ SOLN
10.0000 mL | INTRAMUSCULAR | Status: DC | PRN
  Administered 2015-05-26: 10 mL
  Filled 2015-05-26: qty 10

## 2015-05-26 MED ORDER — DIPHENHYDRAMINE HCL 25 MG PO CAPS
50.0000 mg | ORAL_CAPSULE | Freq: Once | ORAL | Status: AC
  Administered 2015-05-26: 50 mg via ORAL

## 2015-05-26 MED ORDER — LORAZEPAM 2 MG/ML IJ SOLN
1.0000 mg | Freq: Once | INTRAMUSCULAR | Status: AC | PRN
  Administered 2015-05-26: 1 mg via INTRAVENOUS

## 2015-05-26 MED ORDER — TRASTUZUMAB CHEMO INJECTION 440 MG
6.0000 mg/kg | Freq: Once | INTRAVENOUS | Status: AC
  Administered 2015-05-26: 798 mg via INTRAVENOUS
  Filled 2015-05-26: qty 38

## 2015-05-26 MED ORDER — ACETAMINOPHEN 325 MG PO TABS
650.0000 mg | ORAL_TABLET | Freq: Once | ORAL | Status: AC
  Administered 2015-05-26: 650 mg via ORAL

## 2015-05-26 MED ORDER — ACETAMINOPHEN 325 MG PO TABS
ORAL_TABLET | ORAL | Status: AC
  Filled 2015-05-26: qty 2

## 2015-05-26 MED ORDER — ALPRAZOLAM 1 MG PO TABS: 1.0000 mg | ORAL_TABLET | Freq: Three times a day (TID) | ORAL | Status: DC | PRN

## 2015-05-26 MED ORDER — HEPARIN SOD (PORK) LOCK FLUSH 100 UNIT/ML IV SOLN
500.0000 [IU] | Freq: Once | INTRAVENOUS | Status: AC | PRN
  Administered 2015-05-26: 500 [IU]
  Filled 2015-05-26: qty 5

## 2015-05-26 MED ORDER — SODIUM CHLORIDE 0.9 % IV SOLN
Freq: Once | INTRAVENOUS | Status: AC
  Administered 2015-05-26: 10:00:00 via INTRAVENOUS

## 2015-05-26 MED ORDER — DIPHENHYDRAMINE HCL 25 MG PO CAPS
ORAL_CAPSULE | ORAL | Status: AC
Start: 2015-05-26 — End: 2015-05-26
  Filled 2015-05-26: qty 2

## 2015-05-26 MED ORDER — LORAZEPAM 2 MG/ML IJ SOLN
INTRAMUSCULAR | Status: AC
  Filled 2015-05-26: qty 1

## 2015-05-26 NOTE — Progress Notes (Unsigned)
Panic PT/INR given to Zigmund Daniel, RN at 1000 by DCM.

## 2015-05-26 NOTE — Patient Instructions (Signed)

## 2015-05-26 NOTE — Patient Instructions (Addendum)
Trastuzumab injection for infusion What is this medicine? TRASTUZUMAB (tras TOO zoo mab) is a monoclonal antibody. It targets a protein called HER2. This protein is found in some stomach and breast cancers. This medicine can stop cancer cell growth. This medicine may be used with other cancer treatments. This medicine may be used for other purposes; ask your health care Orvil Faraone or pharmacist if you have questions. COMMON BRAND NAME(S): Herceptin What should I tell my health care Shelsea Hangartner before I take this medicine? They need to know if you have any of these conditions: -heart disease -heart failure -infection (especially a virus infection such as chickenpox, cold sores, or herpes) -lung or breathing disease, like asthma -recent or ongoing radiation therapy -an unusual or allergic reaction to trastuzumab, benzyl alcohol, or other medications, foods, dyes, or preservatives -pregnant or trying to get pregnant -breast-feeding How should I use this medicine? This drug is given as an infusion into a vein. It is administered in a hospital or clinic by a specially trained health care professional. Talk to your pediatrician regarding the use of this medicine in children. This medicine is not approved for use in children. Overdosage: If you think you have taken too much of this medicine contact a poison control center or emergency room at once. NOTE: This medicine is only for you. Do not share this medicine with others. What if I miss a dose? It is important not to miss a dose. Call your doctor or health care professional if you are unable to keep an appointment. What may interact with this medicine? -cyclophosphamide -doxorubicin -warfarin This list may not describe all possible interactions. Give your health care Smitty Ackerley a list of all the medicines, herbs, non-prescription drugs, or dietary supplements you use. Also tell them if you smoke, drink alcohol, or use illegal drugs. Some items may  interact with your medicine. What should I watch for while using this medicine? Visit your doctor for checks on your progress. Report any side effects. Continue your course of treatment even though you feel ill unless your doctor tells you to stop. Call your doctor or health care professional for advice if you get a fever, chills or sore throat, or other symptoms of a cold or flu. Do not treat yourself. Try to avoid being around people who are sick. You may experience fever, chills and shaking during your first infusion. These effects are usually mild and can be treated with other medicines. Report any side effects during the infusion to your health care professional. Fever and chills usually do not happen with later infusions. What side effects may I notice from receiving this medicine? Side effects that you should report to your doctor or other health care professional as soon as possible: -breathing difficulties -chest pain or palpitations -cough -dizziness or fainting -fever or chills, sore throat -skin rash, itching or hives -swelling of the legs or ankles -unusually weak or tired Side effects that usually do not require medical attention (report to your doctor or other health care professional if they continue or are bothersome): -loss of appetite -headache -muscle aches -nausea This list may not describe all possible side effects. Call your doctor for medical advice about side effects. You may report side effects to FDA at 1-800-FDA-1088. Where should I keep my medicine? This drug is given in a hospital or clinic and will not be stored at home. NOTE: This sheet is a summary. It may not cover all possible information. If you have questions about this medicine, talk   to your doctor, pharmacist, or health care Cassandre Oleksy.  2015, [employer]vier/Gold Standard. (2009-06-17 13:43:15)  [facility] Discharge Instructions for Patients Receiving Chemotherapy  Today you received the  following chemotherapy agents Herceptin.  To help prevent nausea and vomiting after your treatment, we encourage you to take your nausea medication as directed.   If you develop nausea and vomiting that is not controlled by your nausea medication, call the clinic.   BELOW ARE SYMPTOMS THAT SHOULD BE REPORTED IMMEDIATELY:  *FEVER GREATER THAN 100.5 F  *CHILLS WITH OR WITHOUT FEVER  NAUSEA AND VOMITING THAT IS NOT CONTROLLED WITH YOUR NAUSEA MEDICATION  *UNUSUAL SHORTNESS OF BREATH  *UNUSUAL BRUISING OR BLEEDING  TENDERNESS IN MOUTH AND THROAT WITH OR WITHOUT PRESENCE OF ULCERS  *URINARY PROBLEMS  *BOWEL PROBLEMS  UNUSUAL RASH Items with * indicate a potential emergency and should be followed up as soon as possible.  Feel free to call the clinic you have any questions or concerns. The clinic phone number is [phone].  Please show the [place] at check-in to the Emergency Department and triage nurse.  **Hold Coumadin today (9/29) and tomorrow (9/30).  Take 38m daily starting Saturday (10/1).**

## 2015-05-26 NOTE — Telephone Encounter (Signed)
Pt's confirmed labs/ov per 09/28 POF, gave pt AVS and Calendar.... KJ

## 2015-05-26 NOTE — Progress Notes (Signed)
Patient has a critical INR today of 5.10  Per Dr. Lindi Adie patient is to hold coumadin today and tomorrow then start back on 5 mg daily.  Patient to repeat lab next Friday 06/03/15. Patient also requesting alprazolam 1 mg.  Prescription called in to patient's [facility] per Dr. Lindi Adie.

## 2015-05-26 NOTE — Progress Notes (Signed)
D/t elevated PT and INR levels, pt instructed to hold Coumadin dose today (9/29) and tomorrow (9/30) and to start taking 5mg  daily starting on Saturday 10/1 per Dr. Lindi Adie.  Instructions given to pt verbally and included in pt AVS.  Pt verbalized understanding and had no further questions.

## 2015-05-27 ENCOUNTER — Other Ambulatory Visit: Payer: Self-pay | Admitting: Pharmacist

## 2015-05-27 DIAGNOSIS — Z7901 Long term (current) use of anticoagulants: Secondary | ICD-10-CM

## 2015-05-30 ENCOUNTER — Other Ambulatory Visit: Payer: Self-pay | Admitting: Oncology

## 2015-06-03 ENCOUNTER — Other Ambulatory Visit (HOSPITAL_BASED_OUTPATIENT_CLINIC_OR_DEPARTMENT_OTHER): Payer: Commercial Managed Care - HMO

## 2015-06-03 ENCOUNTER — Telehealth: Payer: Self-pay

## 2015-06-03 ENCOUNTER — Telehealth: Payer: Self-pay | Admitting: Nurse Practitioner

## 2015-06-03 DIAGNOSIS — C787 Secondary malignant neoplasm of liver and intrahepatic bile duct: Secondary | ICD-10-CM

## 2015-06-03 DIAGNOSIS — Z7901 Long term (current) use of anticoagulants: Secondary | ICD-10-CM | POA: Diagnosis not present

## 2015-06-03 DIAGNOSIS — C50919 Malignant neoplasm of unspecified site of unspecified female breast: Secondary | ICD-10-CM

## 2015-06-03 DIAGNOSIS — I871 Compression of vein: Secondary | ICD-10-CM

## 2015-06-03 DIAGNOSIS — C50911 Malignant neoplasm of unspecified site of right female breast: Secondary | ICD-10-CM

## 2015-06-03 DIAGNOSIS — C7951 Secondary malignant neoplasm of bone: Secondary | ICD-10-CM

## 2015-06-03 DIAGNOSIS — C50912 Malignant neoplasm of unspecified site of left female breast: Secondary | ICD-10-CM

## 2015-06-03 LAB — COMPREHENSIVE METABOLIC PANEL (CC13)
ALBUMIN: 3.4 g/dL — AB (ref 3.5–5.0)
ALK PHOS: 90 U/L (ref 40–150)
ALT: 15 U/L (ref 0–55)
ANION GAP: 9 meq/L (ref 3–11)
AST: 17 U/L (ref 5–34)
BILIRUBIN TOTAL: 0.53 mg/dL (ref 0.20–1.20)
BUN: 17.2 mg/dL (ref 7.0–26.0)
CALCIUM: 9.2 mg/dL (ref 8.4–10.4)
CO2: 23 mEq/L (ref 22–29)
Chloride: 109 mEq/L (ref 98–109)
Creatinine: 0.8 mg/dL (ref 0.6–1.1)
EGFR: 89 mL/min/{1.73_m2} — AB (ref >90)
Glucose: 97 mg/dl (ref 70–140)
POTASSIUM: 4 meq/L (ref 3.5–5.1)
Sodium: 141 mEq/L (ref 136–145)
TOTAL PROTEIN: 7.6 g/dL (ref 6.4–8.3)

## 2015-06-03 LAB — CBC WITH DIFFERENTIAL/PLATELET
BASO%: 1.3 % (ref 0.0–2.0)
Basophils Absolute: 0.1 10*3/uL (ref 0.0–0.1)
EOS ABS: 0.4 10*3/uL (ref 0.0–0.5)
EOS%: 4.3 % (ref 0.0–7.0)
HCT: 33.9 % — ABNORMAL LOW (ref 34.8–46.6)
HGB: 11.5 g/dL — ABNORMAL LOW (ref 11.6–15.9)
LYMPH%: 35 % (ref 14.0–49.7)
MCH: 27.1 pg (ref 25.1–34.0)
MCHC: 33.8 g/dL (ref 31.5–36.0)
MCV: 80.2 fL (ref 79.5–101.0)
MONO#: 0.7 10*3/uL (ref 0.1–0.9)
MONO%: 8 % (ref 0.0–14.0)
NEUT#: 4.4 10*3/uL (ref 1.5–6.5)
NEUT%: 51.4 % (ref 38.4–76.8)
PLATELETS: 227 10*3/uL (ref 145–400)
RBC: 4.23 10*6/uL (ref 3.70–5.45)
RDW: 15.6 % — ABNORMAL HIGH (ref 11.2–14.5)
WBC: 8.5 10*3/uL (ref 3.9–10.3)
lymph#: 3 10*3/uL (ref 0.9–3.3)

## 2015-06-03 LAB — PROTIME-INR
INR: 2.2 (ref 2.00–3.50)
PROTIME: 26.4 s — AB (ref 10.6–13.4)

## 2015-06-03 NOTE — Telephone Encounter (Signed)
Appointments made and avs made as patient needed added labs flush and hercep

## 2015-06-03 NOTE — Telephone Encounter (Signed)
INR Today- 2.2 Present Dose - 5 mg daily Recheck Lab- 10/20 Patient aware

## 2015-06-15 ENCOUNTER — Other Ambulatory Visit: Payer: Self-pay | Admitting: *Deleted

## 2015-06-15 DIAGNOSIS — C50919 Malignant neoplasm of unspecified site of unspecified female breast: Secondary | ICD-10-CM

## 2015-06-16 ENCOUNTER — Ambulatory Visit: Payer: Commercial Managed Care - HMO

## 2015-06-16 ENCOUNTER — Ambulatory Visit (HOSPITAL_BASED_OUTPATIENT_CLINIC_OR_DEPARTMENT_OTHER): Payer: Commercial Managed Care - HMO

## 2015-06-16 ENCOUNTER — Other Ambulatory Visit: Payer: Self-pay | Admitting: *Deleted

## 2015-06-16 ENCOUNTER — Telehealth: Payer: Self-pay | Admitting: Nurse Practitioner

## 2015-06-16 ENCOUNTER — Other Ambulatory Visit: Payer: Self-pay | Admitting: Nurse Practitioner

## 2015-06-16 ENCOUNTER — Other Ambulatory Visit (HOSPITAL_BASED_OUTPATIENT_CLINIC_OR_DEPARTMENT_OTHER): Payer: Commercial Managed Care - HMO

## 2015-06-16 ENCOUNTER — Other Ambulatory Visit: Payer: Commercial Managed Care - HMO

## 2015-06-16 ENCOUNTER — Ambulatory Visit (HOSPITAL_BASED_OUTPATIENT_CLINIC_OR_DEPARTMENT_OTHER): Payer: Commercial Managed Care - HMO | Admitting: Nurse Practitioner

## 2015-06-16 ENCOUNTER — Other Ambulatory Visit (HOSPITAL_COMMUNITY)
Admission: RE | Admit: 2015-06-16 | Discharge: 2015-06-16 | Disposition: A | Discharge status: Home / Self Care | Payer: Commercial Managed Care - HMO | Source: Ambulatory Visit | Attending: Oncology | Admitting: Oncology

## 2015-06-16 ENCOUNTER — Encounter: Payer: Self-pay | Admitting: Nurse Practitioner

## 2015-06-16 VITALS — BP 139/81 | HR 61 | Temp 98.1°F | Resp 19 | Ht 63.0 in | Wt 308.4 lb

## 2015-06-16 DIAGNOSIS — C7800 Secondary malignant neoplasm of unspecified lung: Secondary | ICD-10-CM

## 2015-06-16 DIAGNOSIS — C50911 Malignant neoplasm of unspecified site of right female breast: Secondary | ICD-10-CM

## 2015-06-16 DIAGNOSIS — C787 Secondary malignant neoplasm of liver and intrahepatic bile duct: Secondary | ICD-10-CM

## 2015-06-16 DIAGNOSIS — I82409 Acute embolism and thrombosis of unspecified deep veins of unspecified lower extremity: Secondary | ICD-10-CM | POA: Diagnosis present

## 2015-06-16 DIAGNOSIS — I871 Compression of vein: Secondary | ICD-10-CM | POA: Diagnosis not present

## 2015-06-16 DIAGNOSIS — C50919 Malignant neoplasm of unspecified site of unspecified female breast: Secondary | ICD-10-CM

## 2015-06-16 DIAGNOSIS — Z7901 Long term (current) use of anticoagulants: Secondary | ICD-10-CM

## 2015-06-16 DIAGNOSIS — Z95828 Presence of other vascular implants and grafts: Secondary | ICD-10-CM

## 2015-06-16 DIAGNOSIS — Z5112 Encounter for antineoplastic immunotherapy: Secondary | ICD-10-CM | POA: Diagnosis not present

## 2015-06-16 LAB — COMPREHENSIVE METABOLIC PANEL (CC13)
ALT: 11 U/L (ref 0–55)
AST: 12 U/L (ref 5–34)
Albumin: 3.5 g/dL (ref 3.5–5.0)
Alkaline Phosphatase: 88 U/L (ref 40–150)
Anion Gap: 7 mEq/L (ref 3–11)
BUN: 17.4 mg/dL (ref 7.0–26.0)
CHLORIDE: 108 meq/L (ref 98–109)
CO2: 26 meq/L (ref 22–29)
Calcium: 9.4 mg/dL (ref 8.4–10.4)
Creatinine: 0.8 mg/dL (ref 0.6–1.1)
EGFR: 88 mL/min/{1.73_m2} — ABNORMAL LOW (ref >90)
GLUCOSE: 105 mg/dL (ref 70–140)
POTASSIUM: 4.3 meq/L (ref 3.5–5.1)
SODIUM: 141 meq/L (ref 136–145)
Total Bilirubin: 0.6 mg/dL (ref 0.20–1.20)
Total Protein: 7.8 g/dL (ref 6.4–8.3)

## 2015-06-16 LAB — CBC WITH DIFFERENTIAL/PLATELET
BASO%: 0.4 % (ref 0.0–2.0)
BASOS ABS: 0 10*3/uL (ref 0.0–0.1)
EOS%: 2.1 % (ref 0.0–7.0)
Eosinophils Absolute: 0.2 10*3/uL (ref 0.0–0.5)
HCT: 34.5 % — ABNORMAL LOW (ref 34.8–46.6)
HEMOGLOBIN: 11.8 g/dL (ref 11.6–15.9)
LYMPH%: 28.8 % (ref 14.0–49.7)
MCH: 27.7 pg (ref 25.1–34.0)
MCHC: 34.2 g/dL (ref 31.5–36.0)
MCV: 81 fL (ref 79.5–101.0)
MONO#: 0.7 10*3/uL (ref 0.1–0.9)
MONO%: 8.2 % (ref 0.0–14.0)
NEUT#: 4.9 10*3/uL (ref 1.5–6.5)
NEUT%: 60.5 % (ref 38.4–76.8)
Platelets: 252 10*3/uL (ref 145–400)
RBC: 4.26 10*6/uL (ref 3.70–5.45)
RDW: 16.1 % — AB (ref 11.2–14.5)
WBC: 8.1 10*3/uL (ref 3.9–10.3)
lymph#: 2.3 10*3/uL (ref 0.9–3.3)

## 2015-06-16 LAB — PROTIME-INR
INR: 1.8 — AB (ref 0.00–1.49)
Prothrombin Time: 20.9 seconds — ABNORMAL HIGH (ref 11.6–15.2)

## 2015-06-16 MED ORDER — TEMAZEPAM 30 MG PO CAPS: 30.0000 mg | ORAL_CAPSULE | Freq: Every evening | ORAL | Status: DC | PRN

## 2015-06-16 MED ORDER — SODIUM CHLORIDE 0.9 % IJ SOLN
10.0000 mL | INTRAMUSCULAR | Status: DC | PRN
  Administered 2015-06-16: 10 mL via INTRAVENOUS
  Filled 2015-06-16: qty 10

## 2015-06-16 MED ORDER — DIPHENHYDRAMINE HCL 25 MG PO CAPS
ORAL_CAPSULE | ORAL | Status: AC
  Filled 2015-06-16: qty 1

## 2015-06-16 MED ORDER — SODIUM CHLORIDE 0.9 % IJ SOLN
10.0000 mL | INTRAMUSCULAR | Status: DC | PRN
  Administered 2015-06-16: 10 mL
  Filled 2015-06-16: qty 10

## 2015-06-16 MED ORDER — DIPHENHYDRAMINE HCL 25 MG PO CAPS
50.0000 mg | ORAL_CAPSULE | Freq: Once | ORAL | Status: AC
  Administered 2015-06-16: 50 mg via ORAL

## 2015-06-16 MED ORDER — HEPARIN SOD (PORK) LOCK FLUSH 100 UNIT/ML IV SOLN
500.0000 [IU] | Freq: Once | INTRAVENOUS | Status: AC | PRN
  Administered 2015-06-16: 500 [IU]
  Filled 2015-06-16: qty 5

## 2015-06-16 MED ORDER — LORAZEPAM 2 MG/ML IJ SOLN
INTRAMUSCULAR | Status: AC
  Filled 2015-06-16: qty 1

## 2015-06-16 MED ORDER — ACETAMINOPHEN 325 MG PO TABS
650.0000 mg | ORAL_TABLET | Freq: Once | ORAL | Status: AC
  Administered 2015-06-16: 650 mg via ORAL

## 2015-06-16 MED ORDER — ACETAMINOPHEN 325 MG PO TABS
ORAL_TABLET | ORAL | Status: AC
  Filled 2015-06-16: qty 2

## 2015-06-16 MED ORDER — SODIUM CHLORIDE 0.9 % IV SOLN
Freq: Once | INTRAVENOUS | Status: AC
  Administered 2015-06-16: 10:00:00 via INTRAVENOUS

## 2015-06-16 MED ORDER — LORAZEPAM 2 MG/ML IJ SOLN
1.0000 mg | Freq: Once | INTRAMUSCULAR | Status: AC | PRN
  Administered 2015-06-16: 1 mg via INTRAVENOUS

## 2015-06-16 MED ORDER — GABAPENTIN 300 MG PO CAPS: 600.0000 mg | ORAL_CAPSULE | Freq: Three times a day (TID) | ORAL | Status: DC

## 2015-06-16 MED ORDER — TRASTUZUMAB CHEMO INJECTION 440 MG
6.0000 mg/kg | Freq: Once | INTRAVENOUS | Status: AC
  Administered 2015-06-16: 798 mg via INTRAVENOUS
  Filled 2015-06-16: qty 38

## 2015-06-16 NOTE — Patient Instructions (Addendum)
[place] Discharge Instructions for Patients Receiving Chemotherapy  Today you received the following chemotherapy agents Herceptin.  To help prevent nausea and vomiting after your treatment, we encourage you to take your nausea medication as directed.   If you develop nausea and vomiting that is not controlled by your nausea medication, call the clinic.   BELOW ARE SYMPTOMS THAT SHOULD BE REPORTED IMMEDIATELY:  *FEVER GREATER THAN 100.5 F  *CHILLS WITH OR WITHOUT FEVER  NAUSEA AND VOMITING THAT IS NOT CONTROLLED WITH YOUR NAUSEA MEDICATION  *UNUSUAL SHORTNESS OF BREATH  *UNUSUAL BRUISING OR BLEEDING  TENDERNESS IN MOUTH AND THROAT WITH OR WITHOUT PRESENCE OF ULCERS  *URINARY PROBLEMS  *BOWEL PROBLEMS  UNUSUAL RASH Items with * indicate a potential emergency and should be followed up as soon as possible.  Feel free to call the clinic you have any questions or concerns. The clinic phone number is [phone].  Please show the [place] at check-in to the Emergency Department and triage nurse.  **Take 7.5mg  Friday and Saturday, then return to 5mg  daily.  Return for blood work on Tuesday 10/25.**

## 2015-06-16 NOTE — Telephone Encounter (Signed)
Called patient and left a message with lab appointment

## 2015-06-16 NOTE — Progress Notes (Signed)
Reviewed INR and PT with Nira Conn, NP.  Per her order pt advised to take 7.5mg  Coumadin for next 2 days and then back to 5mg  daily.  Pt scheduled to return 10/25 for follow up blood work.  Pt notified of plan and instructions included in AVS.  Pt had no questions or concerns at time of discharge.

## 2015-06-16 NOTE — Patient Instructions (Signed)

## 2015-06-16 NOTE — Progress Notes (Signed)
[place]  Telephone:(336) [phone] Fax:(336) [phone]  OFFICE PROGRESS NOTE  ID: [name]   DOB: [birth year]  MR#: [id number]  SNK#:[id number]  PCP: Ernestene Kiel, MD GYN:  SU:  OTHER MD: Pierre Bali, Johnnette Gourd, Wendee Copp  CC: Metastatic HER-2 positive breast cancer.  CURRENT TREATMENT: Trastuzumab  BREAST CANCER HISTORY:   From the earlier summary:  [name] is 64 years old [name] female.  This woman has been in good health all of her life.  She noted a swelling and discomfort in her right breast in June 2004.  She was seen in the Emergency Room in [place] and was treated for mastitis.  She was treated for a number of months with mastitis and the swelling did not get better. She was given hydrocodone and Cipro.  Finally, the swelling did get better and ultimately the nipple became retracted and she noticed some dimpling in her skin. She had a mammogram in July of 2004 in [place] with subsequent mammogram on May 27, 2003, by Dr. Isaiah Blakes.  Mammogram done on September 30 showed marked increased density in the left breast.  Biopsy was performed the same day.  It was noted at the 12 o'clock position, deep in the breast was a focal hypoechoic mass, at least 3.5 cm in diameter.  Biopsy did in fact show invasive in situ mammary carcinoma. This was felt to be both at least intermediate, high grade.  No definite lymphovascular invasion was identified. ER and PR negative, Her2 testing positive. [name] continues to have pain in her breast.  She continues to take hydrocodone a number of times a day.  She has been seen by Dr. Rosana Hoes, who felt that neoadjuvant chemotherapy would be required.    Initial staging studies showed evidence of liver and lung mets.   Patient also has evidence of bone lesions. Patient started neoadjuvant chemotherapy, Taxotere/Carbo/Herceptin in October 2004.   Patient had a CT scan in December 2004 which  demonstrated extensive clot in the SVC innominate vein, bilateral jugular vein and  She was started on anticoagulation therapy. She received a total of 6 cycles of Taxotere/Carbo/Herceptin, completed in April 2005."  Patient has been on  trastuzumab continued indefinitely; has also received lapatinib and capecitabine for variable intervals in 2007-2008. Most recent echo 12/01/2013 showed an ejection fraction of 55%. She is status post bilateral mastectomies with bilateral axillary lymph node dissection 12/07/2004, showing (a) on the right, a mypT1c ypN1 invasive ductal carcinoma, grade 3, estrogen and progesterone receptor negative, HER-2 positive, with an MIB-1 of 31% (b) on the left, ypT2 ypN1 invasive ductal carcinoma, grade 2, estrogen and progesterone receptor negative, HER-2 positive, with an MIB-1 of 35%. She is Status post radiation June through July of 2006, to the right chest wall, left chest wall, bilateral supraclavicular fossae, and bilateral axillary boosts; with additional radiation to the right and left chest walls and the central chest wall completed November of 2007. She is status post ixempra x9 completed August of 2009. 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 Patient has chronic pain, with negative PET scan 08/24/2013 (no evidence of active cancer). On Neurontin and Tramadol therapy.  Her subsequent history is as detailed below  INTERVAL HISTORY: [name] returns today for followup of her stage IV breast cancer accompanied by her husband, Abe People. She continues on trastuzumab every 3 weeks with no issues. Her most recent echocardiogram at the end  of August showed a well preserved ejection fraction.   REVIEW OF SYSTEMS: The interval history is unremarkable. [name] has several chronic issues that have not worsened since her last visit. She has chronic insomnia and requires trazodone for sleep. She  continues on gabapentin TID for chest spasms and hot flashes. She denies fevers, chills, nausea, vomiting, or changes in bowel or bladder habits. She continues on coumadin and denies abnormal bruising or bleeding. She has shortness of breath chronically with occasional wheezing that requires albuterol PRN. Sh denies headaches, dizziness, or vision changes. A detailed review of systems is otherwise stable.   PAST MEDICAL HISTORY: Past Medical History  Diagnosis Date  . Breast cancer     mets to liver and lung  . Hypertension   . SVC syndrome   . History of chemotherapy Feb. 2006    taxotere/herceptin/carboplatin  . Radiation 07/31/2006    left upper chest  . Radiation 06/17/2006-06/27/2006    6480 cGy bilat. chest wall  . Neuropathy   . Thrombosis   . Breast cancer metastasized to multiple sites 02/26/2013    PAST SURGICAL HISTORY: Past Surgical History  Procedure Laterality Date  . Tubal ligation  1986  . Cholecystectomy  1989  . Mastectomy Bilateral   . Ankle surgery    . Peripherally inserted central catheter insertion    . Back surgery      FAMILY HISTORY Family History  Problem Relation Age of Onset  . Heart failure Father   . Cancer Father     Prostate cancer  . Heart failure Brother   . Cancer Brother     Prostate cancer  . Diabetes Maternal Aunt    She had three brothers, one died of gunshot wound, one of complications of diabetes mellitus and one of myocardial infarction.  She has no sisters.  Mother died of complications of brain metastasis in 44.  Father has had a myocardial infarction in 1999.  No history of breast or ovarian cancer in the family.     GYNECOLOGIC HISTORY:   Menarche at age 39.  Gravida 3, para 3.  First live birth at age 31.  No history of breast feeding. No history of hormonal replacement therapy.   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 Her husband used to work as a  Art gallery manager, but is now retired. She has three children, Monette who lives in [place] and works as a Hydrographic surveyor, Financial risk analyst who lives in [place] and works as a Administrator, and Fairmont who lives in [place] and also works as a Hydrographic surveyor. The patient has 12 grandchildren and 4 great-grandchildren. She attends a [employer]. Very involved with school kids.  ADVANCED DIRECTIVES:  Not in place  HEALTH MAINTENANCE: (Updated 06/12/2013) Social History  Substance Use Topics  . Smoking status: Never Smoker   . Smokeless tobacco: Never Used  . Alcohol Use: Yes     Comment: occasional     Colonoscopy: Never and "I don't want one"  PAP:  1987  Bone density:  Never  Lipid panel:  Not on file    Allergies  Allergen Reactions  . Penicillins Hives    PATIENT HAS TOLERATED CEPHALOSPORINGS  . Adhesive [Tape] Other (See Comments)    Tears skin     Current Outpatient Prescriptions  Medication Sig Dispense Refill  . acetaminophen (TYLENOL) 500 MG tablet Take 1,000 mg by mouth every 6 (  six) hours as needed for mild pain or fever.     Marland Kitchen albuterol (PROVENTIL HFA;VENTOLIN HFA) 108 (90 BASE) MCG/ACT inhaler Inhale 2 puffs into the lungs every 6 (six) hours as needed for wheezing. 1 Inhaler 5  . ALPRAZolam (XANAX) 1 MG tablet Take 1 tablet (1 mg total) by mouth 3 (three) times daily as needed. for anxiety 60 tablet 1  . amLODipine (NORVASC) 10 MG tablet Take 10 mg by mouth every morning.    . baclofen (LIORESAL) 10 MG tablet Take 1 tablet (10 mg total) by mouth 3 (three) times daily as needed. for muscle spams 270 tablet 3  . carvedilol (COREG) 3.125 MG tablet TAKE 1 TABLET BY MOUTH TWICE DAILY 180 tablet 3  . diclofenac sodium (VOLTAREN) 1 % GEL Apply 2 g topically daily as needed (for pain). Apply to knees and shoulders 100 g 6  . furosemide (LASIX) 80 MG tablet Take 80 mg by mouth daily as needed for fluid.     Marland Kitchen losartan (COZAAR) 100 MG tablet Take 100 mg by mouth every morning.     . potassium chloride (K-DUR,KLOR-CON) 10 MEQ tablet Take 4 tablets (40 mEq total) by mouth daily as needed. 120 tablet 5  . spironolactone (ALDACTONE) 25 MG tablet Take 0.5 tablets (12.5 mg total) by mouth daily. 30 tablet 3  . venlafaxine XR (EFFEXOR-XR) 37.5 MG 24 hr capsule Take 1 capsule (37.5 mg total) by mouth daily with breakfast. 30 capsule 5  . warfarin (COUMADIN) 5 MG tablet TAKE 1 TABLET BY MOUTH EVERY DAY AS DIRECTED 90 tablet 0  . gabapentin (NEURONTIN) 300 MG capsule Take 2 capsules (600 mg total) by mouth 3 (three) times daily. 180 capsule 0  . HYDROcodone-acetaminophen (NORCO) 10-325 MG per tablet TAKE 1/2 TO 1 TABLET PO EVERY 6 HOURS PRN FOR PAIN  0  . temazepam (RESTORIL) 30 MG capsule Take 1 capsule (30 mg total) by mouth at bedtime as needed for sleep. 30 capsule 0  . warfarin (COUMADIN) 7.5 MG tablet Take 1 tablet (7.5 mg total) by mouth daily. (Patient not taking: Reported on 06/16/2015) 30 tablet 6   No current facility-administered medications for this visit.   Facility-Administered Medications Ordered in Other Visits  Medication Dose Route Frequency Provider Last Rate Last Dose  . sodium chloride 0.9 % injection 10 mL  10 mL Intravenous PRN Chauncey Cruel, MD   10 mL at 12/17/13 1103    OBJECTIVE:  Middle aged African American woman in no acute distress Filed Vitals:   06/16/15 0850  BP: 139/81  Pulse: 61  Temp: 98.1 F (36.7 C)  Resp: 19     Body mass index is 54.64 kg/(m^2).    ECOG FS: 1 Filed Weights   06/16/15 0850  Weight: 308 lb 6.4 oz (139.889 kg)   Filed Weights   06/16/15 0850  Weight: 308 lb 6.4 oz (139.889 kg)   Skin: warm, dry  HEENT: sclerae anicteric, conjunctivae pink, oropharynx clear. No thrush or mucositis.  Lymph Nodes: No cervical or supraclavicular lymphadenopathy  Lungs: clear to auscultation bilaterally, no rales, wheezes, or rhonci  Heart: regular rate and rhythm  Abdomen: round, soft, non tender, positive bowel sounds   Musculoskeletal: No focal spinal tenderness, no peripheral edema  Neuro: non focal, well oriented, positive affect  Breasts: bilateral breasts status post mastectomies. No evidence of recurrent disease to chest wall. Bilateral axillae benign.   LAB RESULTS: Lab Results  Component Value Date   WBC 8.1 06/16/2015  NEUTROABS 4.9 06/16/2015   HGB 11.8 06/16/2015   HCT 34.5* 06/16/2015   MCV 81.0 06/16/2015   PLT 252 06/16/2015      Chemistry      Component Value Date/Time   NA 141 06/16/2015 0759   NA 139 12/09/2014 0940   K 4.3 06/16/2015 0759   K 3.9 12/09/2014 0940   CL 107 12/09/2014 0940   CL 104 01/30/2013 0850   CO2 26 06/16/2015 0759   CO2 25 12/09/2014 0940   BUN 17.4 06/16/2015 0759   BUN 16 12/09/2014 0940   CREATININE 0.8 06/16/2015 0759   CREATININE 0.75 12/09/2014 0940      Component Value Date/Time   CALCIUM 9.4 06/16/2015 0759   CALCIUM 8.8 12/09/2014 0940   ALKPHOS 88 06/16/2015 0759   ALKPHOS 94 12/09/2014 0940   AST 12 06/16/2015 0759   AST 20 12/09/2014 0940   ALT 11 06/16/2015 0759   ALT 20 12/09/2014 0940   BILITOT 0.60 06/16/2015 0759   BILITOT 0.4 12/09/2014 0940     STUDIES:  No results found.  ASSESSMENT: 64 y.o.  [place], [place], woman  (1)  with a history of inflammatory right breast cancer metastatic at presentation September 2004 with involvement of liver and bone, HER-2 positive, estrogen and progesterone receptor negative  (2) treated with carboplatin, docetaxel and Herceptin x 6 completed April 2005  (3) trastuzumab continued indefinitely;   (a) has also received lapatinib and capecitabine for variable intervals in 2007-2008.  (b) Most recent echo 01/25/2015 showed an ejection fraction of 55-60%. To be performed every 4 months from this point.  (4) status post bilateral mastectomies with bilateral axillary lymph node dissection 12/07/2004, showing  (a) on the right, a mypT1c ypN1 invasive ductal carcinoma, grade 3,  estrogen and progesterone receptor negative, HER-2 positive, with an MIB-1 of 31%  (b) on the left, ypT2 ypN1 invasive ductal carcinoma, grade 2, estrogen and progesterone receptor negative, HER-2 positive, with an MIB-1 of 35%.  (5)  Status post radiation June through July of 2006, to the right chest wall, left chest wall, bilateral supraclavicular fossae, and bilateral axillary boosts; with additional radiation to the right and left chest walls and the central chest wall completed November of 2007  (6) status post Ixempra x9 completed August of 2009.  (7) history of superior vena caval syndrome, on life long anticoagulation   (8)  History of chemotherapy-induced neuropathy.   (9)  chronic pain, with negative PET scan 08/24/2013 (no evidence of active cancer). On Neurontin and Tramadol  (10) right upper extremity cellulitis, no bacteremia; treated with cephalexin /doxycycline for 2 weeks, with resolution   PLAN: [name] looks and feels well today. She has no new complaints to offer. The CBC and CMET were reviewed in detail and were stable. She will proceed with her next dose of tratsuzumab today as planned and indefinitely until we have evidence of recurrence.   Her INR was 1.8 today. I am increasing her coumadin to 7.39m for the next 2 days, then she is to return to 540mdaily. We will recheck her INR in 5 days.  Nashea will have a repeat echocardiogram in November. I have also ordered a repeat PET for mid January She will return to our officee in late January to review the results of the PET with Dr. MaJana HakimShe understands and agrees with this plan. She knows the goal of treatment in her case is control. She has been encouraged to call with any issues  that might arise before her next visit here.   Laurie Panda, NP  06/16/2015 9:55 AM

## 2015-06-20 ENCOUNTER — Telehealth (HOSPITAL_COMMUNITY): Payer: Self-pay | Admitting: *Deleted

## 2015-06-21 ENCOUNTER — Other Ambulatory Visit (HOSPITAL_BASED_OUTPATIENT_CLINIC_OR_DEPARTMENT_OTHER): Payer: Commercial Managed Care - HMO

## 2015-06-21 DIAGNOSIS — C50911 Malignant neoplasm of unspecified site of right female breast: Secondary | ICD-10-CM | POA: Diagnosis not present

## 2015-06-21 DIAGNOSIS — I871 Compression of vein: Secondary | ICD-10-CM | POA: Diagnosis not present

## 2015-06-21 DIAGNOSIS — C787 Secondary malignant neoplasm of liver and intrahepatic bile duct: Secondary | ICD-10-CM | POA: Diagnosis not present

## 2015-06-21 DIAGNOSIS — Z7901 Long term (current) use of anticoagulants: Secondary | ICD-10-CM | POA: Diagnosis not present

## 2015-06-21 DIAGNOSIS — C50919 Malignant neoplasm of unspecified site of unspecified female breast: Secondary | ICD-10-CM

## 2015-06-21 DIAGNOSIS — C7800 Secondary malignant neoplasm of unspecified lung: Secondary | ICD-10-CM

## 2015-06-21 LAB — COMPREHENSIVE METABOLIC PANEL (CC13)
ALT: 13 U/L (ref 0–55)
ANION GAP: 8 meq/L (ref 3–11)
AST: 12 U/L (ref 5–34)
Albumin: 3.5 g/dL (ref 3.5–5.0)
Alkaline Phosphatase: 90 U/L (ref 40–150)
BILIRUBIN TOTAL: 0.62 mg/dL (ref 0.20–1.20)
BUN: 12.8 mg/dL (ref 7.0–26.0)
CO2: 27 meq/L (ref 22–29)
CREATININE: 0.9 mg/dL (ref 0.6–1.1)
Calcium: 9.7 mg/dL (ref 8.4–10.4)
Chloride: 109 mEq/L (ref 98–109)
EGFR: 81 mL/min/{1.73_m2} — ABNORMAL LOW (ref >90)
GLUCOSE: 128 mg/dL (ref 70–140)
Potassium: 4.1 mEq/L (ref 3.5–5.1)
Sodium: 144 mEq/L (ref 136–145)
TOTAL PROTEIN: 7.9 g/dL (ref 6.4–8.3)

## 2015-06-21 LAB — CBC WITH DIFFERENTIAL/PLATELET
BASO%: 0.7 % (ref 0.0–2.0)
BASOS ABS: 0 10*3/uL (ref 0.0–0.1)
EOS ABS: 0.1 10*3/uL (ref 0.0–0.5)
EOS%: 1.8 % (ref 0.0–7.0)
HEMATOCRIT: 34.8 % (ref 34.8–46.6)
HGB: 11.6 g/dL (ref 11.6–15.9)
LYMPH#: 1.8 10*3/uL (ref 0.9–3.3)
LYMPH%: 27 % (ref 14.0–49.7)
MCH: 27.1 pg (ref 25.1–34.0)
MCHC: 33.2 g/dL (ref 31.5–36.0)
MCV: 81.5 fL (ref 79.5–101.0)
MONO#: 0.4 10*3/uL (ref 0.1–0.9)
MONO%: 6.1 % (ref 0.0–14.0)
NEUT#: 4.4 10*3/uL (ref 1.5–6.5)
NEUT%: 64.4 % (ref 38.4–76.8)
PLATELETS: 254 10*3/uL (ref 145–400)
RBC: 4.26 10*6/uL (ref 3.70–5.45)
RDW: 15.9 % — ABNORMAL HIGH (ref 11.2–14.5)
WBC: 6.8 10*3/uL (ref 3.9–10.3)

## 2015-06-21 LAB — PROTIME-INR
INR: 2.8 (ref 2.00–3.50)
Protime: 33.6 Seconds — ABNORMAL HIGH (ref 10.6–13.4)

## 2015-06-23 ENCOUNTER — Telehealth: Payer: Self-pay | Admitting: *Deleted

## 2015-06-23 NOTE — Telephone Encounter (Signed)
INR 2.8  Coumadin dose 5mg  daily (post 2 day boost of 7.5mg  ).  Next lab 07/07/2015.  Called pt and informed her to continue current dose.  No other concerns.

## 2015-06-29 NOTE — Patient Outreach (Signed)
[facility]) Care Management  06/29/2015  [name] December 23, 1950 [id number]   Referral from [facility] tier 4 list, assigned to Quinn Plowman, RN for patient outreach.  Dell Hurtubise L. Sumaiya Arruda, [place] Care Management Assistant

## 2015-07-06 ENCOUNTER — Other Ambulatory Visit: Payer: Self-pay | Admitting: Oncology

## 2015-07-07 ENCOUNTER — Telehealth: Payer: Self-pay

## 2015-07-07 ENCOUNTER — Ambulatory Visit (HOSPITAL_BASED_OUTPATIENT_CLINIC_OR_DEPARTMENT_OTHER): Payer: Commercial Managed Care - HMO

## 2015-07-07 ENCOUNTER — Ambulatory Visit: Payer: Commercial Managed Care - HMO

## 2015-07-07 ENCOUNTER — Other Ambulatory Visit (HOSPITAL_BASED_OUTPATIENT_CLINIC_OR_DEPARTMENT_OTHER): Payer: Commercial Managed Care - HMO

## 2015-07-07 VITALS — BP 167/88 | HR 66 | Temp 97.9°F | Resp 18

## 2015-07-07 DIAGNOSIS — C50919 Malignant neoplasm of unspecified site of unspecified female breast: Secondary | ICD-10-CM | POA: Diagnosis not present

## 2015-07-07 DIAGNOSIS — Z5112 Encounter for antineoplastic immunotherapy: Secondary | ICD-10-CM

## 2015-07-07 DIAGNOSIS — I871 Compression of vein: Secondary | ICD-10-CM | POA: Diagnosis not present

## 2015-07-07 DIAGNOSIS — Z95828 Presence of other vascular implants and grafts: Secondary | ICD-10-CM

## 2015-07-07 LAB — COMPREHENSIVE METABOLIC PANEL (CC13)
ALBUMIN: 3.5 g/dL (ref 3.5–5.0)
ALK PHOS: 88 U/L (ref 40–150)
ALT: 10 U/L (ref 0–55)
AST: 11 U/L (ref 5–34)
Anion Gap: 8 mEq/L (ref 3–11)
BUN: 15.5 mg/dL (ref 7.0–26.0)
CALCIUM: 9.9 mg/dL (ref 8.4–10.4)
CO2: 25 mEq/L (ref 22–29)
CREATININE: 0.8 mg/dL (ref 0.6–1.1)
Chloride: 109 mEq/L (ref 98–109)
EGFR: 86 mL/min/{1.73_m2} — ABNORMAL LOW (ref >90)
GLUCOSE: 95 mg/dL (ref 70–140)
Potassium: 3.8 mEq/L (ref 3.5–5.1)
SODIUM: 142 meq/L (ref 136–145)
Total Bilirubin: 0.62 mg/dL (ref 0.20–1.20)
Total Protein: 8.1 g/dL (ref 6.4–8.3)

## 2015-07-07 LAB — CBC WITH DIFFERENTIAL/PLATELET
BASO%: 0.9 % (ref 0.0–2.0)
Basophils Absolute: 0.1 10*3/uL (ref 0.0–0.1)
EOS%: 2.2 % (ref 0.0–7.0)
Eosinophils Absolute: 0.2 10*3/uL (ref 0.0–0.5)
HEMATOCRIT: 36.4 % (ref 34.8–46.6)
HEMOGLOBIN: 12.3 g/dL (ref 11.6–15.9)
LYMPH#: 2.8 10*3/uL (ref 0.9–3.3)
LYMPH%: 31.6 % (ref 14.0–49.7)
MCH: 27.3 pg (ref 25.1–34.0)
MCHC: 33.9 g/dL (ref 31.5–36.0)
MCV: 80.5 fL (ref 79.5–101.0)
MONO#: 0.6 10*3/uL (ref 0.1–0.9)
MONO%: 6.3 % (ref 0.0–14.0)
NEUT%: 59 % (ref 38.4–76.8)
NEUTROS ABS: 5.2 10*3/uL (ref 1.5–6.5)
Platelets: 245 10*3/uL (ref 145–400)
RBC: 4.52 10*6/uL (ref 3.70–5.45)
RDW: 15.8 % — AB (ref 11.2–14.5)
WBC: 8.8 10*3/uL (ref 3.9–10.3)

## 2015-07-07 LAB — PROTIME-INR
INR: 2.4 (ref 2.00–3.50)
Protime: 28.8 Seconds — ABNORMAL HIGH (ref 10.6–13.4)

## 2015-07-07 MED ORDER — SODIUM CHLORIDE 0.9 % IJ SOLN
10.0000 mL | INTRAMUSCULAR | Status: DC | PRN
  Administered 2015-07-07: 10 mL via INTRAVENOUS
  Filled 2015-07-07: qty 10

## 2015-07-07 MED ORDER — DIPHENHYDRAMINE HCL 25 MG PO CAPS
ORAL_CAPSULE | ORAL | Status: AC
  Filled 2015-07-07: qty 1

## 2015-07-07 MED ORDER — ACETAMINOPHEN 325 MG PO TABS
ORAL_TABLET | ORAL | Status: AC
  Filled 2015-07-07: qty 2

## 2015-07-07 MED ORDER — SODIUM CHLORIDE 0.9 % IV SOLN
Freq: Once | INTRAVENOUS | Status: AC
  Administered 2015-07-07: 09:00:00 via INTRAVENOUS

## 2015-07-07 MED ORDER — LORAZEPAM 2 MG/ML IJ SOLN
1.0000 mg | Freq: Once | INTRAMUSCULAR | Status: AC | PRN
  Administered 2015-07-07: 1 mg via INTRAVENOUS

## 2015-07-07 MED ORDER — ACETAMINOPHEN 325 MG PO TABS
650.0000 mg | ORAL_TABLET | Freq: Once | ORAL | Status: AC
  Administered 2015-07-07: 650 mg via ORAL

## 2015-07-07 MED ORDER — HEPARIN SOD (PORK) LOCK FLUSH 100 UNIT/ML IV SOLN
500.0000 [IU] | Freq: Once | INTRAVENOUS | Status: AC | PRN
  Administered 2015-07-07: 500 [IU]
  Filled 2015-07-07: qty 5

## 2015-07-07 MED ORDER — LORAZEPAM 2 MG/ML IJ SOLN
INTRAMUSCULAR | Status: AC
  Filled 2015-07-07: qty 1

## 2015-07-07 MED ORDER — SODIUM CHLORIDE 0.9 % IJ SOLN
10.0000 mL | INTRAMUSCULAR | Status: DC | PRN
  Administered 2015-07-07: 10 mL
  Filled 2015-07-07: qty 10

## 2015-07-07 MED ORDER — DIPHENHYDRAMINE HCL 25 MG PO CAPS
25.0000 mg | ORAL_CAPSULE | Freq: Once | ORAL | Status: AC
  Administered 2015-07-07: 25 mg via ORAL

## 2015-07-07 MED ORDER — TRASTUZUMAB CHEMO INJECTION 440 MG
6.0000 mg/kg | Freq: Once | INTRAVENOUS | Status: AC
  Administered 2015-07-07: 798 mg via INTRAVENOUS
  Filled 2015-07-07: qty 38

## 2015-07-07 NOTE — Telephone Encounter (Signed)
INR: 2.4 Present Dose: 5 mg daily Repeat Labs: 2 weeks LVM for patient with information encouraging her to call back with questions.

## 2015-07-07 NOTE — Patient Instructions (Signed)

## 2015-07-07 NOTE — Patient Instructions (Addendum)
[place] Discharge Instructions for Patients Receiving Chemotherapy  Today you received the following chemotherapy agents Herceptin.  To help prevent nausea and vomiting after your treatment, we encourage you to take your nausea medication as directed.   If you develop nausea and vomiting that is not controlled by your nausea medication, call the clinic.   BELOW ARE SYMPTOMS THAT SHOULD BE REPORTED IMMEDIATELY:  *FEVER GREATER THAN 100.5 F  *CHILLS WITH OR WITHOUT FEVER  NAUSEA AND VOMITING THAT IS NOT CONTROLLED WITH YOUR NAUSEA MEDICATION  *UNUSUAL SHORTNESS OF BREATH  *UNUSUAL BRUISING OR BLEEDING  TENDERNESS IN MOUTH AND THROAT WITH OR WITHOUT PRESENCE OF ULCERS  *URINARY PROBLEMS  *BOWEL PROBLEMS  UNUSUAL RASH Items with * indicate a potential emergency and should be followed up as soon as possible.  Feel free to call the clinic should you have any questions or concerns. The clinic phone number is [phone].  Please show the [place] at check-in to the Emergency Department and triage nurse.  **Take 7.5mg  Friday and Saturday, then return to 5mg  daily.  Return for blood work on Tuesday 10/25.**

## 2015-07-08 ENCOUNTER — Telehealth: Payer: Self-pay | Admitting: Oncology

## 2015-07-08 ENCOUNTER — Other Ambulatory Visit: Payer: Self-pay

## 2015-07-08 NOTE — Telephone Encounter (Signed)
Lt mess for pt regard lab appt on 11/21 at 1:30

## 2015-07-08 NOTE — Telephone Encounter (Signed)
Left message for patient re lab 11/21 and also added inf to 09/22/15 lab/fu. Schedule mailed.

## 2015-07-08 NOTE — Patient Outreach (Addendum)
[place] [facility]) Care Management  07/08/2015  [name] [birth year] [id number]   SUBJECTIVE: Telephone call to patient regarding  [facility] HMO high risk referral. HIPAA verified with patient.  Discussed and offered [facility] care management services to patient. Patient refused services at this time.  Patient verbalized agreement to receive [facility] care management outreach letter and brochure.  RNCM advised patient to call back after review of letter/brochure for any additional questions or if [facility] care management services needed in the future.   PLAN; RNCM will refer patient to [place] to close due to refusal of services.  RNCM will notify patients primary MD of closure/refusal of services.  RNCM will send patient outreach letter and brochure.   Quinn Plowman RN,BSN,CCM [place] Coordinator [phone]

## 2015-07-11 ENCOUNTER — Telehealth: Payer: Self-pay | Admitting: Oncology

## 2015-07-11 NOTE — Telephone Encounter (Signed)
Returned patients call to reschedule her lab   [name]

## 2015-07-13 ENCOUNTER — Telehealth: Payer: Self-pay | Admitting: *Deleted

## 2015-07-13 ENCOUNTER — Other Ambulatory Visit: Payer: Self-pay

## 2015-07-13 DIAGNOSIS — F419 Anxiety disorder, unspecified: Secondary | ICD-10-CM

## 2015-07-13 MED ORDER — ALPRAZOLAM 1 MG PO TABS: 1.0000 mg | ORAL_TABLET | Freq: Three times a day (TID) | ORAL | Status: DC | PRN

## 2015-07-13 NOTE — Telephone Encounter (Signed)
Patient called requesting "refill for Alprazolam I called my pharmacy on Monday for a refill.  They haven't heard anything yet so I'm calling.  My return number when order called in to [facility] in [place] is [phone]."

## 2015-07-13 NOTE — Telephone Encounter (Signed)
Patient notified that her prescription was called in to the pharmacy.

## 2015-07-13 NOTE — Telephone Encounter (Signed)
Phoned in by collaborative today at 12:27 pm.

## 2015-07-18 ENCOUNTER — Other Ambulatory Visit: Payer: Commercial Managed Care - HMO

## 2015-07-19 ENCOUNTER — Other Ambulatory Visit: Payer: Self-pay | Admitting: Nurse Practitioner

## 2015-07-19 ENCOUNTER — Encounter (HOSPITAL_COMMUNITY): Payer: Self-pay | Admitting: Internal Medicine

## 2015-07-19 ENCOUNTER — Other Ambulatory Visit (HOSPITAL_BASED_OUTPATIENT_CLINIC_OR_DEPARTMENT_OTHER): Payer: Commercial Managed Care - HMO

## 2015-07-19 ENCOUNTER — Ambulatory Visit (HOSPITAL_BASED_OUTPATIENT_CLINIC_OR_DEPARTMENT_OTHER)
Admission: RE | Admit: 2015-07-19 | Discharge: 2015-07-19 | Disposition: A | Discharge status: Home / Self Care | Payer: Commercial Managed Care - HMO | Source: Ambulatory Visit | Attending: Internal Medicine | Admitting: Internal Medicine

## 2015-07-19 ENCOUNTER — Ambulatory Visit: Payer: Self-pay

## 2015-07-19 ENCOUNTER — Ambulatory Visit (HOSPITAL_COMMUNITY)
Admission: RE | Admit: 2015-07-19 | Discharge: 2015-07-19 | Disposition: A | Discharge status: Home / Self Care | Payer: Commercial Managed Care - HMO | Source: Ambulatory Visit | Attending: Internal Medicine | Admitting: Internal Medicine

## 2015-07-19 VITALS — BP 156/88 | HR 78 | Wt 306.0 lb

## 2015-07-19 DIAGNOSIS — Z08 Encounter for follow-up examination after completed treatment for malignant neoplasm: Secondary | ICD-10-CM | POA: Insufficient documentation

## 2015-07-19 DIAGNOSIS — M954 Acquired deformity of chest and rib: Secondary | ICD-10-CM | POA: Diagnosis not present

## 2015-07-19 DIAGNOSIS — C50919 Malignant neoplasm of unspecified site of unspecified female breast: Secondary | ICD-10-CM

## 2015-07-19 DIAGNOSIS — I10 Essential (primary) hypertension: Secondary | ICD-10-CM

## 2015-07-19 DIAGNOSIS — I5022 Chronic systolic (congestive) heart failure: Secondary | ICD-10-CM

## 2015-07-19 DIAGNOSIS — Z7901 Long term (current) use of anticoagulants: Secondary | ICD-10-CM

## 2015-07-19 LAB — PROTIME-INR
INR: 3.5 (ref 2.00–3.50)
Protime: 42 Seconds — ABNORMAL HIGH (ref 10.6–13.4)

## 2015-07-19 LAB — CBC WITH DIFFERENTIAL/PLATELET
BASO%: 0.4 % (ref 0.0–2.0)
BASOS ABS: 0 10*3/uL (ref 0.0–0.1)
EOS ABS: 0.2 10*3/uL (ref 0.0–0.5)
EOS%: 2.9 % (ref 0.0–7.0)
HEMATOCRIT: 34.7 % — AB (ref 34.8–46.6)
HGB: 11.9 g/dL (ref 11.6–15.9)
LYMPH#: 3 10*3/uL (ref 0.9–3.3)
LYMPH%: 41.2 % (ref 14.0–49.7)
MCH: 27.4 pg (ref 25.1–34.0)
MCHC: 34.3 g/dL (ref 31.5–36.0)
MCV: 80 fL (ref 79.5–101.0)
MONO#: 0.6 10*3/uL (ref 0.1–0.9)
MONO%: 8.7 % (ref 0.0–14.0)
NEUT#: 3.5 10*3/uL (ref 1.5–6.5)
NEUT%: 46.8 % (ref 38.4–76.8)
PLATELETS: 237 10*3/uL (ref 145–400)
RBC: 4.34 10*6/uL (ref 3.70–5.45)
RDW: 15.8 % — ABNORMAL HIGH (ref 11.2–14.5)
WBC: 7.4 10*3/uL (ref 3.9–10.3)

## 2015-07-19 LAB — COMPREHENSIVE METABOLIC PANEL (CC13)
ALT: 12 U/L (ref 0–55)
ANION GAP: 10 meq/L (ref 3–11)
AST: 14 U/L (ref 5–34)
Albumin: 3.6 g/dL (ref 3.5–5.0)
Alkaline Phosphatase: 88 U/L (ref 40–150)
BILIRUBIN TOTAL: 0.66 mg/dL (ref 0.20–1.20)
BUN: 18.7 mg/dL (ref 7.0–26.0)
CALCIUM: 9.7 mg/dL (ref 8.4–10.4)
CHLORIDE: 104 meq/L (ref 98–109)
CO2: 28 mEq/L (ref 22–29)
CREATININE: 0.9 mg/dL (ref 0.6–1.1)
EGFR: 75 mL/min/{1.73_m2} — AB (ref >90)
Glucose: 107 mg/dl (ref 70–140)
Potassium: 3.9 mEq/L (ref 3.5–5.1)
Sodium: 143 mEq/L (ref 136–145)
Total Protein: 8.1 g/dL (ref 6.4–8.3)

## 2015-07-19 MED ORDER — SPIRONOLACTONE 25 MG PO TABS: 25.0000 mg | ORAL_TABLET | Freq: Every day | ORAL | Status: DC

## 2015-07-19 NOTE — Telephone Encounter (Signed)
Patient and Dr. Jana Hakim aware of patient's INR today- 3.5  Per Dr. Jana Hakim he would like patient to continue on present dose of 5 mg of coumadin daily.. Patient stated understanding and will return on December 1st for repeat INR lab.

## 2015-07-19 NOTE — Progress Notes (Signed)
Advanced Heart Failure Clinic Note   Patient ID: [name], female   DOB: [birth year], 64 y.o.   MRN: [id number] Oncologist: Dr Jana Hakim  HPI: [name] is a 64 year old with a history of metastatic HER-2 positive breast carcinoma originally diagnosed September 2004. Started in R breast. Underwent neo-adjuvant chemo. During this time developed L breast CA. Underwent bilateral mastectomy. Lymph nodes +. 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   She received a total of 6 cycles of Taxotere/Carbo/Herceptin, completed in April 2005, after which she began single agent Herceptin, given every 3 weeks. Plan to continue on Herceptin indefinitely.   She returns today for regular follow up.  No complaints.  No SOB with flat ground, can get around whole grocery store. Does note DOE with incline or steps.  Hasn't needed lasix in a few weeks, but does feel swollen. Prefers not to take. Give her bad cramps and muscle spasms. Does have occasional lightheadedness from sitting to standing. Mostly limited by arthritis. Does have unilateral HAs 2-3 times per week, seem migrainous in nature. Has to lay down in a room by herself until they pass. Got new breast prosthetics about 1 month ago.   Labs 11/15: K 4, creatinine 1.0   Labs 1/16: K 4.1, creatinine 1.0 Labs 8/16: K 4.3, creatinine 0.9 Labs 11/16: K 3.9, creatinine 0.9  04/23/12 ECHO EF 60-65% Lateral s' 8.9 cm/s  07/30/12 ECHO EF 60-65% Lateral s' 8.3 cm/s  09/11/12 ECHO EF 60-65% Lateral s' 10.3 cm/s  12/22/12 ECHO 55-60% Lateral S' 9.8 RV mildly dilated  07/01/13 ECHO 55-60%, lateral s' 9.79, mild RV dilation, grade II DD 3/15 ECHO 55%, mild MR, lateral s' 9.6, GLS -19.2% 03/02/14 ECHO EF 55-60% Lateral S' 9.4 GLS - 21.9  11/15 ECHO EF 60-65%, lateral S' 6.8, GLS -17.2%, mild RV dilation with normal systolic function, mild MR.  2/16 ECHO EF 60-65%, lateral S' 14.4, GLS -70.0%, normal diastolic function,  mild RV dilation with normal RV systolic function.  8/16 ECHO EF 60-65%, mild LVH, normal RV size and systolic function, lateral s' 13.2, GLS -17%.  11/16 ECHO EF 60-65% Lateral S' 10.2, GLS -20.2%  ROS: All systems negative except as listed in HPI, PMH and Problem List.  Past Medical History  Diagnosis Date  . Breast cancer ([place])     mets to liver and lung  . Hypertension   . SVC syndrome   . History of chemotherapy Feb. 2006    taxotere/herceptin/carboplatin  . Radiation 07/31/2006    left upper chest  . Radiation 06/17/2006-06/27/2006    6480 cGy bilat. chest wall  . Neuropathy ([place])   . Thrombosis   . Breast cancer metastasized to multiple sites [facility]) 02/26/2013    Current Outpatient Prescriptions  Medication Sig Dispense Refill  . acetaminophen (TYLENOL) 500 MG tablet Take 1,000 mg by mouth every 6 (six) hours as needed for mild pain or fever.     Marland Kitchen albuterol (PROVENTIL HFA;VENTOLIN HFA) 108 (90 BASE) MCG/ACT inhaler Inhale 2 puffs into the lungs every 6 (six) hours as needed for wheezing. 1 Inhaler 5  . ALPRAZolam (XANAX) 1 MG tablet Take 1 tablet (1 mg total) by mouth 3 (three) times daily as needed. for anxiety 60 tablet 1  . amLODipine (NORVASC) 10 MG tablet Take 10 mg by mouth every morning.    . baclofen (LIORESAL) 10 MG tablet Take 1 tablet (10 mg total) by mouth 3 (three) times daily as needed. for  muscle spams 270 tablet 3  . carvedilol (COREG) 3.125 MG tablet TAKE 1 TABLET BY MOUTH TWICE DAILY 180 tablet 3  . diclofenac sodium (VOLTAREN) 1 % GEL Apply 2 g topically daily as needed (for pain). Apply to knees and shoulders 100 g 6  . gabapentin (NEURONTIN) 300 MG capsule Take 2 capsules (600 mg total) by mouth 3 (three) times daily. 180 capsule 0  . HYDROcodone-acetaminophen (NORCO) 10-325 MG per tablet TAKE 1/2 TO 1 TABLET PO EVERY 6 HOURS PRN FOR PAIN  0  . losartan (COZAAR) 100 MG tablet Take 100 mg by mouth every morning.    Marland Kitchen spironolactone (ALDACTONE) 25 MG tablet  Take 0.5 tablets (12.5 mg total) by mouth daily. 30 tablet 3  . temazepam (RESTORIL) 30 MG capsule Take 1 capsule (30 mg total) by mouth at bedtime as needed for sleep. 30 capsule 0  . venlafaxine XR (EFFEXOR-XR) 37.5 MG 24 hr capsule Take 1 capsule (37.5 mg total) by mouth daily with breakfast. 30 capsule 5  . warfarin (COUMADIN) 5 MG tablet TAKE 1 TABLET BY MOUTH EVERY DAY AS DIRECTED 90 tablet 0  . furosemide (LASIX) 80 MG tablet Take 80 mg by mouth daily as needed for fluid.     . potassium chloride (K-DUR,KLOR-CON) 10 MEQ tablet Take 4 tablets (40 mEq total) by mouth daily as needed. (Patient not taking: Reported on 07/19/2015) 120 tablet 5   No current facility-administered medications for this encounter.   Facility-Administered Medications Ordered in Other Encounters  Medication Dose Route Frequency Provider Last Rate Last Dose  . sodium chloride 0.9 % injection 10 mL  10 mL Intravenous PRN Chauncey Cruel, MD   10 mL at 12/17/13 1103    Filed Vitals:   07/19/15 1133  BP: 156/88  Pulse: 78  Weight: 306 lb (138.801 kg)  SpO2: 94%    PHYSICAL EXAM: General: NAD, obese. HEENT: normal  Neck: Thick. no JVD difficult to assess d/t body habitus but does not appear elevated; Carotids 2+ bilat; no bruits. No lymphadenopathy or thryomegaly appreciated.  Cor: PMI nondisplaced. Regular rate & rhythm. No rubs, gallops or murmurs. S/P bilateral mastectomies  Lungs: CTA, normal effort Abdomen: obese. soft, NT, ND. +BS  Extremities: no cyanosis, clubbing, rash.  Trace edema.  Neuro: alert & oriented x 3, cranial nerves grossly intact. moves all 4 extremities w/o difficulty. Affect pleasant.  ASSESSMENT & PLAN: 1) L Breast Cancer s/p bilateral mastectomies:  Symptomatically stable.   - Echo today stable with EF 60-65% Lateral S' 10.2, GLS -20.2%.  She can continue Herceptin.  Continue losartan and Coreg.  Followup in 6 months for echo and office visit.  - Prefers not to take lasix with  muscle cramps and spasms. Encouraged to limit fluid intake to less than 2L daily and to take a lasix if weight rises.  2) Suspected OSA: Patient has thick neck, mild RV dilation, snores at night.  Prefers to avoid  sleep study.   3) HTN: Elevated, and has been up with recent infusions.   - Increase Spiro to 25 mg daily. - She says her arthritis is particularly bad today.  4) HAs: Per PCP. Will see if better BP control helps.   Had labs this morning. F/u 6 months with echo.  Increase spiro to 25 mg daily. Should ask PCP about HAs if they continue.   Shirley Friar PA-C 07/19/2015  Patient seen and examined with Oda Kilts, PA-C. We discussed all aspects of the encounter.  I agree with the assessment and plan as stated above.   I reviewed echos personally. EF and Doppler parameters stable. No HF on exam. Continue Herceptin. Increase spiro for HTN. Check labs in 1 week.   Bensimhon, Daniel,MD 7:32 PM

## 2015-07-19 NOTE — Patient Instructions (Signed)
Follow up in 6 months with an echo same day appointment  Increase Spironolactone 25 mg daily

## 2015-07-19 NOTE — Progress Notes (Signed)
INR Today 3.5 Present Dose of Coumadin 5 mg daily. Dr. Jana Hakim aware

## 2015-07-19 NOTE — Progress Notes (Signed)
  Echocardiogram 2D Echocardiogram has been performed.  [name] 07/19/2015, 10:50 AM

## 2015-07-28 ENCOUNTER — Telehealth: Payer: Self-pay

## 2015-07-28 ENCOUNTER — Ambulatory Visit (HOSPITAL_BASED_OUTPATIENT_CLINIC_OR_DEPARTMENT_OTHER): Payer: Commercial Managed Care - HMO

## 2015-07-28 ENCOUNTER — Ambulatory Visit: Payer: Commercial Managed Care - HMO

## 2015-07-28 ENCOUNTER — Other Ambulatory Visit (HOSPITAL_BASED_OUTPATIENT_CLINIC_OR_DEPARTMENT_OTHER): Payer: Commercial Managed Care - HMO

## 2015-07-28 VITALS — BP 140/83 | HR 73 | Temp 98.3°F | Resp 16

## 2015-07-28 DIAGNOSIS — C50911 Malignant neoplasm of unspecified site of right female breast: Secondary | ICD-10-CM

## 2015-07-28 DIAGNOSIS — Z5112 Encounter for antineoplastic immunotherapy: Secondary | ICD-10-CM | POA: Diagnosis not present

## 2015-07-28 DIAGNOSIS — I871 Compression of vein: Secondary | ICD-10-CM

## 2015-07-28 DIAGNOSIS — Z7901 Long term (current) use of anticoagulants: Secondary | ICD-10-CM | POA: Diagnosis not present

## 2015-07-28 DIAGNOSIS — C787 Secondary malignant neoplasm of liver and intrahepatic bile duct: Secondary | ICD-10-CM

## 2015-07-28 DIAGNOSIS — C50912 Malignant neoplasm of unspecified site of left female breast: Secondary | ICD-10-CM

## 2015-07-28 DIAGNOSIS — C50919 Malignant neoplasm of unspecified site of unspecified female breast: Secondary | ICD-10-CM

## 2015-07-28 DIAGNOSIS — Z95828 Presence of other vascular implants and grafts: Secondary | ICD-10-CM

## 2015-07-28 LAB — PROTIME-INR
INR: 2.4 (ref 2.00–3.50)
PROTIME: 28.8 s — AB (ref 10.6–13.4)

## 2015-07-28 LAB — COMPREHENSIVE METABOLIC PANEL (CC13)
ALT: 14 U/L (ref 0–55)
AST: 13 U/L (ref 5–34)
Albumin: 3.6 g/dL (ref 3.5–5.0)
Alkaline Phosphatase: 94 U/L (ref 40–150)
Anion Gap: 9 mEq/L (ref 3–11)
BILIRUBIN TOTAL: 0.75 mg/dL (ref 0.20–1.20)
BUN: 14.6 mg/dL (ref 7.0–26.0)
CO2: 23 meq/L (ref 22–29)
Calcium: 9.7 mg/dL (ref 8.4–10.4)
Chloride: 108 mEq/L (ref 98–109)
Creatinine: 0.9 mg/dL (ref 0.6–1.1)
EGFR: 83 mL/min/{1.73_m2} — AB (ref >90)
GLUCOSE: 105 mg/dL (ref 70–140)
Potassium: 3.9 mEq/L (ref 3.5–5.1)
SODIUM: 140 meq/L (ref 136–145)
TOTAL PROTEIN: 8.6 g/dL — AB (ref 6.4–8.3)

## 2015-07-28 LAB — CBC WITH DIFFERENTIAL/PLATELET
BASO%: 1.2 % (ref 0.0–2.0)
Basophils Absolute: 0.1 10*3/uL (ref 0.0–0.1)
EOS ABS: 0.1 10*3/uL (ref 0.0–0.5)
EOS%: 1.3 % (ref 0.0–7.0)
HCT: 37.2 % (ref 34.8–46.6)
HEMOGLOBIN: 12.4 g/dL (ref 11.6–15.9)
LYMPH%: 27.4 % (ref 14.0–49.7)
MCH: 27 pg (ref 25.1–34.0)
MCHC: 33.4 g/dL (ref 31.5–36.0)
MCV: 80.9 fL (ref 79.5–101.0)
MONO#: 0.6 10*3/uL (ref 0.1–0.9)
MONO%: 6.3 % (ref 0.0–14.0)
NEUT%: 63.8 % (ref 38.4–76.8)
NEUTROS ABS: 6 10*3/uL (ref 1.5–6.5)
Platelets: 261 10*3/uL (ref 145–400)
RBC: 4.6 10*6/uL (ref 3.70–5.45)
RDW: 16 % — AB (ref 11.2–14.5)
WBC: 9.4 10*3/uL (ref 3.9–10.3)
lymph#: 2.6 10*3/uL (ref 0.9–3.3)

## 2015-07-28 MED ORDER — SODIUM CHLORIDE 0.9 % IV SOLN
Freq: Once | INTRAVENOUS | Status: AC
  Administered 2015-07-28: 10:00:00 via INTRAVENOUS

## 2015-07-28 MED ORDER — HEPARIN SOD (PORK) LOCK FLUSH 100 UNIT/ML IV SOLN
500.0000 [IU] | Freq: Once | INTRAVENOUS | Status: AC | PRN
  Administered 2015-07-28: 500 [IU]
  Filled 2015-07-28: qty 5

## 2015-07-28 MED ORDER — LORAZEPAM 2 MG/ML IJ SOLN
INTRAMUSCULAR | Status: AC
  Filled 2015-07-28: qty 1

## 2015-07-28 MED ORDER — DIPHENHYDRAMINE HCL 25 MG PO CAPS
25.0000 mg | ORAL_CAPSULE | Freq: Once | ORAL | Status: AC
  Administered 2015-07-28: 25 mg via ORAL

## 2015-07-28 MED ORDER — SODIUM CHLORIDE 0.9 % IJ SOLN
10.0000 mL | INTRAMUSCULAR | Status: DC | PRN
  Administered 2015-07-28: 10 mL via INTRAVENOUS
  Filled 2015-07-28: qty 10

## 2015-07-28 MED ORDER — DIPHENHYDRAMINE HCL 25 MG PO CAPS
ORAL_CAPSULE | ORAL | Status: AC
  Filled 2015-07-28: qty 1

## 2015-07-28 MED ORDER — LORAZEPAM 2 MG/ML IJ SOLN
1.0000 mg | Freq: Once | INTRAMUSCULAR | Status: AC | PRN
  Administered 2015-07-28: 1 mg via INTRAVENOUS

## 2015-07-28 MED ORDER — SODIUM CHLORIDE 0.9 % IJ SOLN
10.0000 mL | INTRAMUSCULAR | Status: DC | PRN
  Administered 2015-07-28: 10 mL
  Filled 2015-07-28: qty 10

## 2015-07-28 MED ORDER — ACETAMINOPHEN 325 MG PO TABS
650.0000 mg | ORAL_TABLET | Freq: Once | ORAL | Status: AC
  Administered 2015-07-28: 650 mg via ORAL

## 2015-07-28 MED ORDER — TRASTUZUMAB CHEMO INJECTION 440 MG
6.0000 mg/kg | Freq: Once | INTRAVENOUS | Status: AC
  Administered 2015-07-28: 798 mg via INTRAVENOUS
  Filled 2015-07-28: qty 38

## 2015-07-28 MED ORDER — ACETAMINOPHEN 325 MG PO TABS
ORAL_TABLET | ORAL | Status: AC
  Filled 2015-07-28: qty 2

## 2015-07-28 NOTE — Patient Instructions (Signed)

## 2015-07-28 NOTE — Telephone Encounter (Signed)
INR Today - 2.4 Present Coumadin Dose 5 mg daily Spoke with patient and she will continue on same dose of coumadin.  Patient stated understanding. Repeat INR 08/18/15

## 2015-08-01 ENCOUNTER — Other Ambulatory Visit: Payer: Self-pay | Admitting: Nurse Practitioner

## 2015-08-04 ENCOUNTER — Other Ambulatory Visit (HOSPITAL_COMMUNITY): Payer: Self-pay | Admitting: Internal Medicine

## 2015-08-04 ENCOUNTER — Ambulatory Visit (HOSPITAL_COMMUNITY)
Admission: RE | Admit: 2015-08-04 | Discharge: 2015-08-04 | Disposition: A | Discharge status: Home / Self Care | Payer: Commercial Managed Care - HMO | Source: Ambulatory Visit | Attending: Internal Medicine | Admitting: Internal Medicine

## 2015-08-04 ENCOUNTER — Ambulatory Visit (HOSPITAL_COMMUNITY)
Admission: RE | Admit: 2015-08-04 | Discharge: 2015-08-04 | Disposition: A | Discharge status: Home / Self Care | Payer: Commercial Managed Care - HMO | Source: Ambulatory Visit | Attending: Nurse Practitioner | Admitting: Nurse Practitioner

## 2015-08-04 DIAGNOSIS — M25562 Pain in left knee: Secondary | ICD-10-CM | POA: Insufficient documentation

## 2015-08-04 DIAGNOSIS — M8580 Other specified disorders of bone density and structure, unspecified site: Secondary | ICD-10-CM | POA: Insufficient documentation

## 2015-08-04 DIAGNOSIS — Z9013 Acquired absence of bilateral breasts and nipples: Secondary | ICD-10-CM | POA: Diagnosis not present

## 2015-08-04 DIAGNOSIS — M179 Osteoarthritis of knee, unspecified: Secondary | ICD-10-CM | POA: Diagnosis not present

## 2015-08-04 DIAGNOSIS — N289 Disorder of kidney and ureter, unspecified: Secondary | ICD-10-CM | POA: Insufficient documentation

## 2015-08-04 DIAGNOSIS — C50919 Malignant neoplasm of unspecified site of unspecified female breast: Secondary | ICD-10-CM | POA: Diagnosis not present

## 2015-08-04 DIAGNOSIS — M25561 Pain in right knee: Secondary | ICD-10-CM

## 2015-08-04 LAB — GLUCOSE, CAPILLARY: Glucose-Capillary: 84 mg/dL (ref 65–99)

## 2015-08-04 MED ORDER — FLUDEOXYGLUCOSE F - 18 (FDG) INJECTION
15.9600 | Freq: Once | INTRAVENOUS | Status: AC | PRN
  Administered 2015-08-04: 15.96 via INTRAVENOUS

## 2015-08-18 ENCOUNTER — Other Ambulatory Visit (HOSPITAL_BASED_OUTPATIENT_CLINIC_OR_DEPARTMENT_OTHER): Payer: Commercial Managed Care - HMO

## 2015-08-18 ENCOUNTER — Telehealth: Payer: Self-pay

## 2015-08-18 ENCOUNTER — Ambulatory Visit (HOSPITAL_BASED_OUTPATIENT_CLINIC_OR_DEPARTMENT_OTHER): Payer: Commercial Managed Care - HMO

## 2015-08-18 ENCOUNTER — Ambulatory Visit: Payer: Commercial Managed Care - HMO

## 2015-08-18 VITALS — BP 134/75 | HR 94 | Temp 97.5°F | Resp 18

## 2015-08-18 DIAGNOSIS — Z5112 Encounter for antineoplastic immunotherapy: Secondary | ICD-10-CM

## 2015-08-18 DIAGNOSIS — C50911 Malignant neoplasm of unspecified site of right female breast: Secondary | ICD-10-CM | POA: Diagnosis not present

## 2015-08-18 DIAGNOSIS — Z7901 Long term (current) use of anticoagulants: Secondary | ICD-10-CM

## 2015-08-18 DIAGNOSIS — Z95828 Presence of other vascular implants and grafts: Secondary | ICD-10-CM

## 2015-08-18 DIAGNOSIS — C50919 Malignant neoplasm of unspecified site of unspecified female breast: Secondary | ICD-10-CM

## 2015-08-18 DIAGNOSIS — I871 Compression of vein: Secondary | ICD-10-CM | POA: Diagnosis not present

## 2015-08-18 LAB — CBC WITH DIFFERENTIAL/PLATELET
BASO%: 0.3 % (ref 0.0–2.0)
Basophils Absolute: 0 10*3/uL (ref 0.0–0.1)
EOS ABS: 0.2 10*3/uL (ref 0.0–0.5)
EOS%: 2.2 % (ref 0.0–7.0)
HEMATOCRIT: 32.9 % — AB (ref 34.8–46.6)
HEMOGLOBIN: 11.3 g/dL — AB (ref 11.6–15.9)
LYMPH#: 2.9 10*3/uL (ref 0.9–3.3)
LYMPH%: 39.1 % (ref 14.0–49.7)
MCH: 27.1 pg (ref 25.1–34.0)
MCHC: 34.3 g/dL (ref 31.5–36.0)
MCV: 78.9 fL — ABNORMAL LOW (ref 79.5–101.0)
MONO#: 0.5 10*3/uL (ref 0.1–0.9)
MONO%: 6.7 % (ref 0.0–14.0)
NEUT%: 51.7 % (ref 38.4–76.8)
NEUTROS ABS: 3.8 10*3/uL (ref 1.5–6.5)
NRBC: 0 % (ref 0–0)
PLATELETS: 257 10*3/uL (ref 145–400)
RBC: 4.17 10*6/uL (ref 3.70–5.45)
RDW: 16.1 % — AB (ref 11.2–14.5)
WBC: 7.4 10*3/uL (ref 3.9–10.3)

## 2015-08-18 LAB — PROTIME-INR
INR: 3.1 (ref 2.00–3.50)
Protime: 37.2 Seconds — ABNORMAL HIGH (ref 10.6–13.4)

## 2015-08-18 LAB — COMPREHENSIVE METABOLIC PANEL
ALT: 16 U/L (ref 0–55)
ANION GAP: 10 meq/L (ref 3–11)
AST: 20 U/L (ref 5–34)
Albumin: 3.4 g/dL — ABNORMAL LOW (ref 3.5–5.0)
Alkaline Phosphatase: 86 U/L (ref 40–150)
BILIRUBIN TOTAL: 0.49 mg/dL (ref 0.20–1.20)
BUN: 14.4 mg/dL (ref 7.0–26.0)
CALCIUM: 9.1 mg/dL (ref 8.4–10.4)
CHLORIDE: 109 meq/L (ref 98–109)
CO2: 24 meq/L (ref 22–29)
CREATININE: 1 mg/dL (ref 0.6–1.1)
EGFR: 72 mL/min/{1.73_m2} — ABNORMAL LOW (ref >90)
Glucose: 105 mg/dl (ref 70–140)
Potassium: 4.2 mEq/L (ref 3.5–5.1)
Sodium: 143 mEq/L (ref 136–145)
TOTAL PROTEIN: 7.8 g/dL (ref 6.4–8.3)

## 2015-08-18 MED ORDER — LORAZEPAM 2 MG/ML IJ SOLN
1.0000 mg | Freq: Once | INTRAMUSCULAR | Status: AC | PRN
  Administered 2015-08-18: 1 mg via INTRAVENOUS

## 2015-08-18 MED ORDER — LORAZEPAM 2 MG/ML IJ SOLN
INTRAMUSCULAR | Status: AC
  Filled 2015-08-18: qty 1

## 2015-08-18 MED ORDER — DIPHENHYDRAMINE HCL 25 MG PO CAPS
25.0000 mg | ORAL_CAPSULE | Freq: Once | ORAL | Status: AC
  Administered 2015-08-18: 25 mg via ORAL

## 2015-08-18 MED ORDER — ACETAMINOPHEN 325 MG PO TABS
ORAL_TABLET | ORAL | Status: AC
  Filled 2015-08-18: qty 2

## 2015-08-18 MED ORDER — SODIUM CHLORIDE 0.9 % IJ SOLN
10.0000 mL | INTRAMUSCULAR | Status: DC | PRN
  Administered 2015-08-18: 10 mL via INTRAVENOUS
  Filled 2015-08-18: qty 10

## 2015-08-18 MED ORDER — HEPARIN SOD (PORK) LOCK FLUSH 100 UNIT/ML IV SOLN
500.0000 [IU] | Freq: Once | INTRAVENOUS | Status: AC | PRN
  Administered 2015-08-18: 500 [IU]
  Filled 2015-08-18: qty 5

## 2015-08-18 MED ORDER — ACETAMINOPHEN 325 MG PO TABS
650.0000 mg | ORAL_TABLET | Freq: Once | ORAL | Status: AC
  Administered 2015-08-18: 650 mg via ORAL

## 2015-08-18 MED ORDER — TRASTUZUMAB CHEMO INJECTION 440 MG
6.0000 mg/kg | Freq: Once | INTRAVENOUS | Status: AC
  Administered 2015-08-18: 798 mg via INTRAVENOUS
  Filled 2015-08-18: qty 38

## 2015-08-18 MED ORDER — SODIUM CHLORIDE 0.9 % IV SOLN
Freq: Once | INTRAVENOUS | Status: AC
  Administered 2015-08-18: 09:00:00 via INTRAVENOUS

## 2015-08-18 MED ORDER — SODIUM CHLORIDE 0.9 % IJ SOLN
10.0000 mL | INTRAMUSCULAR | Status: DC | PRN
  Administered 2015-08-18: 10 mL
  Filled 2015-08-18: qty 10

## 2015-08-18 MED ORDER — DIPHENHYDRAMINE HCL 25 MG PO CAPS
ORAL_CAPSULE | ORAL | Status: AC
  Filled 2015-08-18: qty 1

## 2015-08-18 NOTE — Telephone Encounter (Signed)
INR Today- 3.1 Present dose of coumadin- 5 mg daily Per dr. Jana Hakim- "no changes" and repeat labs in one week Patient stated understanding however is traveling for the holidays and won't be back for several weeks so she will have a repeat lab on 09/22/15.

## 2015-08-18 NOTE — Patient Instructions (Signed)
[facility] Discharge Instructions for Patients Receiving Chemotherapy  Today you received the following chemotherapy agents Herceptin.  To help prevent nausea and vomiting after your treatment, we encourage you to take your nausea medication as prescribed by your physician. If you develop nausea and vomiting that is not controlled by your nausea medication, call the clinic.   BELOW ARE SYMPTOMS THAT SHOULD BE REPORTED IMMEDIATELY:  *FEVER GREATER THAN 100.5 F  *CHILLS WITH OR WITHOUT FEVER  NAUSEA AND VOMITING THAT IS NOT CONTROLLED WITH YOUR NAUSEA MEDICATION  *UNUSUAL SHORTNESS OF BREATH  *UNUSUAL BRUISING OR BLEEDING  TENDERNESS IN MOUTH AND THROAT WITH OR WITHOUT PRESENCE OF ULCERS  *URINARY PROBLEMS  *BOWEL PROBLEMS  UNUSUAL RASH Items with * indicate a potential emergency and should be followed up as soon as possible.  Feel free to call the clinic you have any questions or concerns. The clinic phone number is [phone].  Please show the CHEMO ALERT CARD at check-in to the Emergency Department and triage nurse.   

## 2015-08-18 NOTE — Patient Instructions (Signed)

## 2015-09-01 ENCOUNTER — Other Ambulatory Visit: Payer: Self-pay | Admitting: Oncology

## 2015-09-09 ENCOUNTER — Other Ambulatory Visit: Payer: Self-pay | Admitting: Nurse Practitioner

## 2015-09-09 DIAGNOSIS — C50919 Malignant neoplasm of unspecified site of unspecified female breast: Secondary | ICD-10-CM

## 2015-09-16 ENCOUNTER — Other Ambulatory Visit: Payer: Self-pay | Admitting: Nurse Practitioner

## 2015-09-16 ENCOUNTER — Other Ambulatory Visit: Payer: Self-pay | Admitting: Oncology

## 2015-09-22 ENCOUNTER — Other Ambulatory Visit (HOSPITAL_BASED_OUTPATIENT_CLINIC_OR_DEPARTMENT_OTHER): Payer: Commercial Managed Care - HMO

## 2015-09-22 ENCOUNTER — Ambulatory Visit (HOSPITAL_BASED_OUTPATIENT_CLINIC_OR_DEPARTMENT_OTHER): Payer: Commercial Managed Care - HMO

## 2015-09-22 ENCOUNTER — Ambulatory Visit: Payer: Commercial Managed Care - HMO

## 2015-09-22 ENCOUNTER — Telehealth: Payer: Self-pay | Admitting: Oncology

## 2015-09-22 ENCOUNTER — Ambulatory Visit (HOSPITAL_BASED_OUTPATIENT_CLINIC_OR_DEPARTMENT_OTHER): Payer: Commercial Managed Care - HMO | Admitting: Oncology

## 2015-09-22 VITALS — BP 154/85 | HR 72 | Temp 98.4°F | Resp 18 | Ht 63.0 in | Wt 296.4 lb

## 2015-09-22 DIAGNOSIS — C50812 Malignant neoplasm of overlapping sites of left female breast: Secondary | ICD-10-CM

## 2015-09-22 DIAGNOSIS — C50919 Malignant neoplasm of unspecified site of unspecified female breast: Secondary | ICD-10-CM

## 2015-09-22 DIAGNOSIS — C50912 Malignant neoplasm of unspecified site of left female breast: Secondary | ICD-10-CM

## 2015-09-22 DIAGNOSIS — D689 Coagulation defect, unspecified: Secondary | ICD-10-CM

## 2015-09-22 DIAGNOSIS — C7800 Secondary malignant neoplasm of unspecified lung: Secondary | ICD-10-CM

## 2015-09-22 DIAGNOSIS — Z7901 Long term (current) use of anticoagulants: Secondary | ICD-10-CM

## 2015-09-22 DIAGNOSIS — C787 Secondary malignant neoplasm of liver and intrahepatic bile duct: Secondary | ICD-10-CM | POA: Diagnosis not present

## 2015-09-22 DIAGNOSIS — Z5112 Encounter for antineoplastic immunotherapy: Secondary | ICD-10-CM

## 2015-09-22 DIAGNOSIS — C50911 Malignant neoplasm of unspecified site of right female breast: Secondary | ICD-10-CM | POA: Diagnosis not present

## 2015-09-22 DIAGNOSIS — Z95828 Presence of other vascular implants and grafts: Secondary | ICD-10-CM

## 2015-09-22 DIAGNOSIS — E66813 Obesity, class 3: Secondary | ICD-10-CM | POA: Insufficient documentation

## 2015-09-22 DIAGNOSIS — I82210 Acute embolism and thrombosis of superior vena cava: Secondary | ICD-10-CM

## 2015-09-22 DIAGNOSIS — C50811 Malignant neoplasm of overlapping sites of right female breast: Secondary | ICD-10-CM

## 2015-09-22 DIAGNOSIS — G629 Polyneuropathy, unspecified: Secondary | ICD-10-CM | POA: Insufficient documentation

## 2015-09-22 LAB — CBC WITH DIFFERENTIAL/PLATELET
BASO%: 0.4 % (ref 0.0–2.0)
BASOS ABS: 0 10*3/uL (ref 0.0–0.1)
EOS ABS: 0.2 10*3/uL (ref 0.0–0.5)
EOS%: 2.7 % (ref 0.0–7.0)
HEMATOCRIT: 36.3 % (ref 34.8–46.6)
HEMOGLOBIN: 12.6 g/dL (ref 11.6–15.9)
LYMPH#: 2.2 10*3/uL (ref 0.9–3.3)
LYMPH%: 28.6 % (ref 14.0–49.7)
MCH: 27.5 pg (ref 25.1–34.0)
MCHC: 34.7 g/dL (ref 31.5–36.0)
MCV: 79.3 fL — ABNORMAL LOW (ref 79.5–101.0)
MONO#: 0.7 10*3/uL (ref 0.1–0.9)
MONO%: 8.8 % (ref 0.0–14.0)
NEUT%: 59.5 % (ref 38.4–76.8)
NEUTROS ABS: 4.6 10*3/uL (ref 1.5–6.5)
NRBC: 0 % (ref 0–0)
PLATELETS: 291 10*3/uL (ref 145–400)
RBC: 4.58 10*6/uL (ref 3.70–5.45)
RDW: 16.7 % — AB (ref 11.2–14.5)
WBC: 7.8 10*3/uL (ref 3.9–10.3)

## 2015-09-22 LAB — COMPREHENSIVE METABOLIC PANEL
ALBUMIN: 3.6 g/dL (ref 3.5–5.0)
ALK PHOS: 82 U/L (ref 40–150)
ALT: 11 U/L (ref 0–55)
ANION GAP: 8 meq/L (ref 3–11)
AST: 11 U/L (ref 5–34)
BILIRUBIN TOTAL: 0.48 mg/dL (ref 0.20–1.20)
BUN: 20.7 mg/dL (ref 7.0–26.0)
CALCIUM: 9.6 mg/dL (ref 8.4–10.4)
CO2: 26 meq/L (ref 22–29)
CREATININE: 0.9 mg/dL (ref 0.6–1.1)
Chloride: 107 mEq/L (ref 98–109)
EGFR: 79 mL/min/{1.73_m2} — ABNORMAL LOW (ref >90)
Glucose: 109 mg/dl (ref 70–140)
Potassium: 4.1 mEq/L (ref 3.5–5.1)
Sodium: 142 mEq/L (ref 136–145)
TOTAL PROTEIN: 8 g/dL (ref 6.4–8.3)

## 2015-09-22 LAB — PROTIME-INR
INR: 4.4 — ABNORMAL HIGH (ref 2.00–3.50)
PROTIME: 52.8 s — AB (ref 10.6–13.4)

## 2015-09-22 MED ORDER — ACETAMINOPHEN 325 MG PO TABS
650.0000 mg | ORAL_TABLET | Freq: Once | ORAL | Status: AC
  Administered 2015-09-22: 650 mg via ORAL

## 2015-09-22 MED ORDER — ACETAMINOPHEN 325 MG PO TABS
ORAL_TABLET | ORAL | Status: AC
  Filled 2015-09-22: qty 2

## 2015-09-22 MED ORDER — DIPHENHYDRAMINE HCL 25 MG PO CAPS
25.0000 mg | ORAL_CAPSULE | Freq: Once | ORAL | Status: AC
  Administered 2015-09-22: 25 mg via ORAL

## 2015-09-22 MED ORDER — LORAZEPAM 2 MG/ML IJ SOLN
1.0000 mg | Freq: Once | INTRAMUSCULAR | Status: AC | PRN
  Administered 2015-09-22: 1 mg via INTRAVENOUS

## 2015-09-22 MED ORDER — SODIUM CHLORIDE 0.9% FLUSH
10.0000 mL | INTRAVENOUS | Status: DC | PRN
  Administered 2015-09-22: 10 mL via INTRAVENOUS
  Filled 2015-09-22: qty 10

## 2015-09-22 MED ORDER — DIPHENHYDRAMINE HCL 25 MG PO CAPS
ORAL_CAPSULE | ORAL | Status: AC
  Filled 2015-09-22: qty 1

## 2015-09-22 MED ORDER — SODIUM CHLORIDE 0.9 % IV SOLN
Freq: Once | INTRAVENOUS | Status: AC
  Administered 2015-09-22: 11:00:00 via INTRAVENOUS

## 2015-09-22 MED ORDER — SODIUM CHLORIDE 0.9 % IJ SOLN
10.0000 mL | INTRAMUSCULAR | Status: DC | PRN
  Administered 2015-09-22: 10 mL
  Filled 2015-09-22: qty 10

## 2015-09-22 MED ORDER — TRASTUZUMAB CHEMO INJECTION 440 MG
6.0000 mg/kg | Freq: Once | INTRAVENOUS | Status: AC
  Administered 2015-09-22: 798 mg via INTRAVENOUS
  Filled 2015-09-22: qty 38

## 2015-09-22 MED ORDER — LORAZEPAM 2 MG/ML IJ SOLN
INTRAMUSCULAR | Status: AC
  Filled 2015-09-22: qty 1

## 2015-09-22 MED ORDER — HEPARIN SOD (PORK) LOCK FLUSH 100 UNIT/ML IV SOLN
500.0000 [IU] | Freq: Once | INTRAVENOUS | Status: AC | PRN
  Administered 2015-09-22: 500 [IU]
  Filled 2015-09-22: qty 5

## 2015-09-22 NOTE — Progress Notes (Signed)
[name]  [phone] [phone]  OFFICE PROGRESS NOTE  ID: [name]   DOB: [birth year]  MR#: [id number]  STM#:[id number]  PCP: Ernestene Kiel, MD GYN:  SU:  OTHER MD: Pierre Bali, Johnnette Gourd, Wendee Copp  CC: Metastatic HER-2 positive breast cancer.  CURRENT TREATMENT: Trastuzumab  BREAST CANCER HISTORY:   From the earlier summary:  [name] is 65 years old [name] female.  This woman has been in good health all of her life.  She noted a swelling and discomfort in her right breast in June 2004.  She was seen in the Emergency Room in [place] and was treated for mastitis.  She was treated for a number of months with mastitis and the swelling did not get better. She was given hydrocodone and Cipro.  Finally, the swelling did get better and ultimately the nipple became retracted and she noticed some dimpling in her skin. She had a mammogram in July of 2004 in [place] with subsequent mammogram on May 27, 2003, by Dr. Isaiah Blakes.  Mammogram done on September 30 showed marked increased density in the left breast.  Biopsy was performed the same day.  It was noted at the 12 o'clock position, deep in the breast was a focal hypoechoic mass, at least 3.5 cm in diameter.  Biopsy did in fact show invasive in situ mammary carcinoma. This was felt to be both at least intermediate, high grade.  No definite lymphovascular invasion was identified. ER and PR negative, Her2 testing positive. Reeta continues to have pain in her breast.  She continues to take hydrocodone a number of times a day.  She has been seen by Dr. Rosana Hoes, who felt that neoadjuvant chemotherapy would be required.    Initial staging studies showed evidence of liver and lung mets.   Patient also has evidence of bone lesions. Patient started neoadjuvant chemotherapy, Taxotere/Carbo/Herceptin in October 2004.   Patient had a CT scan in December 2004 which  demonstrated extensive clot in the SVC innominate vein, bilateral jugular vein and  She was started on anticoagulation therapy. She received a total of 6 cycles of Taxotere/Carbo/Herceptin, completed in April 2005."  Patient has been on  trastuzumab continued indefinitely; has also received lapatinib and capecitabine for variable intervals in 2007-2008. Most recent echo 12/01/2013 showed an ejection fraction of 55%. She is status post bilateral mastectomies with bilateral axillary lymph node dissection 12/07/2004, showing (a) on the right, a mypT1c ypN1 invasive ductal carcinoma, grade 3, estrogen and progesterone receptor negative, HER-2 positive, with an MIB-1 of 31% (b) on the left, ypT2 ypN1 invasive ductal carcinoma, grade 2, estrogen and progesterone receptor negative, HER-2 positive, with an MIB-1 of 35%. She is Status post radiation June through July of 2006, to the right chest wall, left chest wall, bilateral supraclavicular fossae, and bilateral axillary boosts; with additional radiation to the right and left chest walls and the central chest wall completed November of 2007. She is status post ixempra x9 completed August of 2009. 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 Patient has chronic pain, with negative PET scan 08/24/2013 (no evidence of active cancer). On Neurontin and Tramadol therapy.  Her subsequent history is as detailed below  INTERVAL HISTORY: [name] returns today for followup of her metastatic HER-2 positive breast cancer accompanied by her husband, Abe People. She continues on active treatment with trastuzumab every 21 days. Since her last visit here she  had an echocardiogram which shows a well preserved ejection fraction. She also had a PET scan in December which showed no evidence of active disease at present. She continues on warfarin for her history of superior vena cava syndrome.   REVIEW OF  SYSTEMS: [name] tells me she did well over the holidays. She takes care of the grandchildren, does some cooking, and visits 6 people. However her knees are a major problem. The right one is worse in the left. Her children had to get her a lift chair so she could get up more easily from a sitting position. She has ordered a very special knee brace that she hopes will help. If it does not she is planning to get back with Dr. Noemi Chapel whom she has seen in the past and consider knee surgery, which of course would be high risk in her case. She continues to have significant obesity problem and has gained some more weight. She has had no bleeding issues from the warfarin although her INR today is elevated. She tolerates the trastuzumab with no side effects that she is aware of. She complains of night sweats, insomnia, easy bruising, and anxiety. Otherwise a detailed review of systems today was stable  PAST MEDICAL HISTORY: Past Medical History  Diagnosis Date  . Breast cancer ([place])     mets to liver and lung  . Hypertension   . SVC syndrome   . History of chemotherapy Feb. 2006    taxotere/herceptin/carboplatin  . Radiation 07/31/2006    left upper chest  . Radiation 06/17/2006-06/27/2006    6480 cGy bilat. chest wall  . Neuropathy ([place])   . Thrombosis   . Breast cancer metastasized to multiple sites [facility]) 02/26/2013    PAST SURGICAL HISTORY: Past Surgical History  Procedure Laterality Date  . Tubal ligation  1986  . Cholecystectomy  1989  . Mastectomy Bilateral   . Ankle surgery    . Peripherally inserted central catheter insertion    . Back surgery      FAMILY HISTORY Family History  Problem Relation Age of Onset  . Heart failure Father   . Cancer Father     Prostate cancer  . Heart failure Brother   . Cancer Brother     Prostate cancer  . Diabetes Maternal Aunt    She had three brothers, one died of gunshot wound, one of complications of diabetes mellitus and one of myocardial  infarction.  She has no sisters.  Mother died of complications of brain metastasis in 40.  Father has had a myocardial infarction in 1999.  No history of breast or ovarian cancer in the family.     GYNECOLOGIC HISTORY:   Menarche at age 39.  Gravida 3, para 3.  First live birth at age 26.  No history of breast feeding. No history of hormonal replacement therapy.   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 Her husband used to work as a Art gallery manager, but is now retired. She has three children, Monette who lives in [place] and works as a Hydrographic surveyor, Financial risk analyst who lives in [place] and works as a Administrator, and [place] who lives in [place] and also works as a Hydrographic surveyor. The patient has 12 grandchildren and 4 great-grandchildren. She attends a [employer]. Very involved with school kids.  ADVANCED DIRECTIVES:  Not in place  HEALTH MAINTENANCE: (Updated 06/12/2013) Social History  Substance Use  Topics  . Smoking status: Never Smoker   . Smokeless tobacco: Never Used  . Alcohol Use: Yes     Comment: occasional     Colonoscopy: Never and "I don't want one"  PAP:  1987  Bone density:  Never  Lipid panel:  Not on file    Allergies  Allergen Reactions  . Penicillins Hives    PATIENT HAS TOLERATED CEPHALOSPORINGS  . Adhesive [Tape] Other (See Comments)    Tears skin     Current Outpatient Prescriptions  Medication Sig Dispense Refill  . acetaminophen (TYLENOL) 500 MG tablet Take 1,000 mg by mouth every 6 (six) hours as needed for mild pain or fever.     Marland Kitchen albuterol (PROVENTIL HFA;VENTOLIN HFA) 108 (90 BASE) MCG/ACT inhaler Inhale 2 puffs into the lungs every 6 (six) hours as needed for wheezing. 1 Inhaler 5  . ALPRAZolam (XANAX) 1 MG tablet TAKE 1 TABLET BY MOUTH THREE TIMES DAILY IF NEEDED FOR ANXIETY 60 tablet 0  . amLODipine (NORVASC) 10 MG tablet Take 10 mg by mouth every morning.    . baclofen  (LIORESAL) 10 MG tablet Take 1 tablet (10 mg total) by mouth 3 (three) times daily as needed. for muscle spams 270 tablet 3  . carvedilol (COREG) 3.125 MG tablet TAKE 1 TABLET BY MOUTH TWICE DAILY 180 tablet 3  . diclofenac sodium (VOLTAREN) 1 % GEL Apply 2 g topically daily as needed (for pain). Apply to knees and shoulders 100 g 6  . furosemide (LASIX) 80 MG tablet Take 80 mg by mouth daily as needed for fluid.     Marland Kitchen gabapentin (NEURONTIN) 300 MG capsule TAKE 2 CAPSULES(600 MG) BY MOUTH THREE TIMES DAILY 540 capsule 0  . HYDROcodone-acetaminophen (NORCO) 10-325 MG per tablet TAKE 1/2 TO 1 TABLET PO EVERY 6 HOURS PRN FOR PAIN  0  . losartan (COZAAR) 100 MG tablet Take 100 mg by mouth every morning.    . potassium chloride (K-DUR,KLOR-CON) 10 MEQ tablet Take 4 tablets (40 mEq total) by mouth daily as needed. (Patient not taking: Reported on 07/19/2015) 120 tablet 5  . spironolactone (ALDACTONE) 25 MG tablet Take 1 tablet (25 mg total) by mouth daily. 30 tablet 3  . temazepam (RESTORIL) 30 MG capsule TAKE 1 CAPSULE BY MOUTH EVERY DAY AT BEDTIME AS NEEDED FOR SLEEP 30 capsule 1  . venlafaxine XR (EFFEXOR-XR) 37.5 MG 24 hr capsule Take 1 capsule (37.5 mg total) by mouth daily with breakfast. 30 capsule 5  . warfarin (COUMADIN) 5 MG tablet TAKE 1 TABLET BY MOUTH EVERY DAY AS DIRECTED 90 tablet 0   No current facility-administered medications for this visit.   Facility-Administered Medications Ordered in Other Visits  Medication Dose Route Frequency Provider Last Rate Last Dose  . sodium chloride 0.9 % injection 10 mL  10 mL Intravenous PRN Chauncey Cruel, MD   10 mL at 12/17/13 1103    OBJECTIVE:  Middle aged African American woman who appears stated age 65 Vitals:   09/22/15 0921  BP: 154/85  Pulse: 72  Temp: 98.4 F (36.9 C)  Resp: 18     Body mass index is 52.52 kg/(m^2).    ECOG FS: 1 Filed Weights   09/22/15 0921  Weight: 296 lb 6.4 oz (134.446 kg)   Filed Weights   09/22/15  0921  Weight: 296 lb 6.4 oz (134.446 kg)   Sclerae unicteric, pupils round and equal Oropharynx clear and moist-- no thrush or other lesions No cervical or  supraclavicular adenopathy Lungs no rales or rhonchi Heart regular rate and rhythm Abd soft,obese, nontender, positive bowel sounds MSK no focal spinal tenderness, no upper extremity lymphedema Neuro: nonfocal, well oriented, appropriate affect Breasts: status post bilateral mastectomies. There is no evidence of chest wall recurrence. Both axillae are benign.  LAB RESULTS: Lab Results  Component Value Date   WBC 7.8 09/22/2015   NEUTROABS 4.6 09/22/2015   HGB 12.6 09/22/2015   HCT 36.3 09/22/2015   MCV 79.3* 09/22/2015   PLT 291 09/22/2015      Chemistry      Component Value Date/Time   NA 142 09/22/2015 0842   NA 139 12/09/2014 0940   K 4.1 09/22/2015 0842   K 3.9 12/09/2014 0940   CL 107 12/09/2014 0940   CL 104 01/30/2013 0850   CO2 26 09/22/2015 0842   CO2 25 12/09/2014 0940   BUN 20.7 09/22/2015 0842   BUN 16 12/09/2014 0940   CREATININE 0.9 09/22/2015 0842   CREATININE 0.75 12/09/2014 0940      Component Value Date/Time   CALCIUM 9.6 09/22/2015 0842   CALCIUM 8.8 12/09/2014 0940   ALKPHOS 82 09/22/2015 0842   ALKPHOS 94 12/09/2014 0940   AST 11 09/22/2015 0842   AST 20 12/09/2014 0940   ALT 11 09/22/2015 0842   ALT 20 12/09/2014 0940   BILITOT 0.48 09/22/2015 0842   BILITOT 0.4 12/09/2014 0940     STUDIES:  No results found. CLINICAL DATA: Subsequent treatment strategy for metastatic breast cancer. Restaging examination.  EXAM: NUCLEAR MEDICINE PET SKULL BASE TO THIGH  TECHNIQUE: 16.0 mCi F-18 FDG was injected intravenously. Full-ring PET imaging was performed from the skull base to thigh after the radiotracer. CT data was obtained and used for attenuation correction and anatomic localization.  FASTING BLOOD GLUCOSE: Value: 84 mg/dl  COMPARISON: PET-CT  12/16/2014.  FINDINGS: NECK  No hypermetabolic lymph nodes in the neck.  CHEST  Postoperative changes of bilateral modified radical mastectomy are redemonstrated. As with numerous prior examinations there is more extensive scarring in the left chest wall, particularly on image 51 of series 4 there is a 12 x 16 mm soft tissue attenuation area associated with a focal area of skin retraction, which extends to the underlying pectoralis musculature. This is stable over numerous prior examinations, and although this demonstrates some internal low-level metabolic activity (SUVmax = 2.6), this is presumably an area of chronic scarring. Likewise, there is a chronic postoperative fluid collection along the lateral aspect of the lower left chest wall immediately inferior to the left pectoralis musculature, which measures up to 1.4 x 7.6 cm, and demonstrates a small amount of metabolic activity along the superior lateral margin (SUVmax = 3 point for). This finding has been stable over numerous prior examinations, and is presumably a chronic postoperative seroma. No new soft tissue mass or lymphadenopathy is noted along either side of the chest wall, or in either axilla. Surgical clips in both axillary regions compatible with bilateral axillary nodal dissection. No hypermetabolic mediastinal or hilar nodes. No suspicious pulmonary nodules on the CT scan. Right internal jugular single-lumen porta cath with tip terminating in the right atrium.  ABDOMEN/PELVIS  No abnormal hypermetabolic activity within the liver, pancreas, adrenal glands, or spleen. No hypermetabolic lymph nodes in the abdomen or pelvis. Status post cholecystectomy. Exophytic 2.2 cm low-attenuation lesion in the anterior aspect of the lower pole of the right kidney is incompletely characterized, but is similar to prior studies and demonstrates no hypermetabolism,  presumably a small cyst. Chronic rim calcified right  adnexal lesion is unchanged over numerous prior examinations and demonstrates no hypermetabolism, presumably benign. Likewise, there is a 3.8 x 2.8 cm exophytic lesion extending off the right side of the uterine fundus which is unchanged, presumably a fibroid. No significant volume of ascites. No pneumoperitoneum.  SKELETON  While there are no definite aggressive appearing lytic or blastic lesions noted in the visualized portions of the skeleton, there are several areas of low-level hypermetabolic activity. Some of these are similar to prior examinations, including activity in the sternum (SUVmax = 3.9, which has been observed on prior studies dating back to at least 2013. This is presumably benign. A few areas of hypermetabolism are noted in the spine, most evident in T8 (SUVmax = 4.9) and L1 (SUVmax = 6.7), however, these are nonspecific in light of the lack of definite osseous lesions on CT portion of the examination, and may simply represent physiologic activity related to degenerative disc disease. )  IMPRESSION: 1. Although there are some nonspecific areas of low-level hypermetabolic activity in the left chest wall and throughout the axial skeleton, many these findings have been present on prior examinations, and are not favored to reflect either residual/recurrent disease or metastatic disease at this time, as discussed above. No definite evidence to suggest recurrent or metastatic disease is observed on today's examination. 2. Additional incidental findings, as above, similar to prior studies.   Electronically Signed  By: Vinnie Langton M.D.  On: 08/04/2015 09:16  Transthoracic Echocardiography  Patient:  [name], [name] MR #:    [id number] Study Date: 07/19/2015 Gender:   F Age:    81 Height:   160 cm Weight:   139.7 kg BSA:    2.58 m^2 Pt. Status: Room:  ATTENDING  Pierre Bali, MD Elenore Paddy,  M.D. REFERRING  Loralie Champagne, M.D. PERFORMING  [facility], Outpatient SONOGRAPHER Roseanna Rainbow  cc:  ------------------------------------------------------------------- LV EF: 60% -  65%    ASSESSMENT: 65 y.o.  [place], [place], woman  (1)  with a history of inflammatory right breast cancer metastatic at presentation September 2004 with involvement of liver and bone, HER-2 positive, estrogen and progesterone receptor negative  (2) treated with carboplatin, docetaxel and Herceptin x 6 completed April 2005  (3) trastuzumab continued indefinitely;   (a) has also received lapatinib and capecitabine for variable intervals in 2007-2008.  (b) Most recent echo 07/19/2015 showed an ejection fraction of 60-65%  To be performed every 4 months from this point.  (4) status post bilateral mastectomies with bilateral axillary lymph node dissection 12/07/2004, showing  (a) on the right, a mypT1c ypN1 invasive ductal carcinoma, grade 3, estrogen and progesterone receptor negative, HER-2 positive, with an MIB-1 of 31%  (b) on the left, ypT2 ypN1 invasive ductal carcinoma, grade 2, estrogen and progesterone receptor negative, HER-2 positive, with an MIB-1 of 35%.  (5)  Status post radiation June through July of 2006, to the right chest wall, left chest wall, bilateral supraclavicular fossae, and bilateral axillary boosts; with additional radiation to the right and left chest walls and the central chest wall completed November of 2007  (6) status post Ixempra x9 completed August of 2009.  (7) history of superior vena caval syndrome, on life long anticoagulation   (8)  History of chemotherapy-induced neuropathy.   (9)  chronic pain, with negative PET scan 08/24/2013 (no evidence of active cancer). On Neurontin and Tramadol  (a) repeat PET scan December 2016 again negative.  (  10) right upper extremity cellulitis, no bacteremia; treated with cephalexin /doxycycline for 2 weeks, with  resolution   PLAN: Tysheena is now 12 years out from her original presentation of metastatic disease, with no evidence of active cancer. This is very favorable.  We are continuing the trastuzumab every 21 days, making no changes there. She will need an echocardiogram again 4 months after the last 1. This has been operationalized.  Her INR is high today. I suggested she take 2.5 mg tomorrow and then continue to alternate with 5. She will be seeing her primary care physician next week and tells me a repeat INR is already planned at that time.  Her weight is a real concern. She is unable to exercise regularly because of her multiple arthritic conditions. If the brace that she is obtaining does not take care of the problem and I would suggest consideration of surgery for her knee. This of course well involved her coming off anticoagulation. She will need bridging.  Her peripheral neuropathy is stable. She tells me she is getting some relief from hydrocodone prescribed by her primary care physician. This is not constipating her.  I think if she increased her venlafaxine to 75 mg her hot flashes would be better controlled. She tells me she is willing to give this a try.  Otherwise she will see [place] again in May. She knows to call for any problems that may develop before that visit.  Chauncey Cruel, MD  09/22/2015 10:02 AM

## 2015-09-22 NOTE — Patient Instructions (Signed)

## 2015-09-22 NOTE — Patient Instructions (Signed)
[facility] Discharge Instructions for Patients Receiving Chemotherapy  Today you received the following chemotherapy agents Herceptin.  To help prevent nausea and vomiting after your treatment, we encourage you to take your nausea medication as prescribed by your physician. If you develop nausea and vomiting that is not controlled by your nausea medication, call the clinic.   BELOW ARE SYMPTOMS THAT SHOULD BE REPORTED IMMEDIATELY:  *FEVER GREATER THAN 100.5 F  *CHILLS WITH OR WITHOUT FEVER  NAUSEA AND VOMITING THAT IS NOT CONTROLLED WITH YOUR NAUSEA MEDICATION  *UNUSUAL SHORTNESS OF BREATH  *UNUSUAL BRUISING OR BLEEDING  TENDERNESS IN MOUTH AND THROAT WITH OR WITHOUT PRESENCE OF ULCERS  *URINARY PROBLEMS  *BOWEL PROBLEMS  UNUSUAL RASH Items with * indicate a potential emergency and should be followed up as soon as possible.  Feel free to call the clinic you have any questions or concerns. The clinic phone number is [phone].  Please show the CHEMO ALERT CARD at check-in to the Emergency Department and triage nurse.   

## 2015-09-22 NOTE — Telephone Encounter (Signed)
Appointments made and avs printed for patient °

## 2015-10-05 ENCOUNTER — Other Ambulatory Visit: Payer: Self-pay | Admitting: Oncology

## 2015-10-13 ENCOUNTER — Other Ambulatory Visit (HOSPITAL_BASED_OUTPATIENT_CLINIC_OR_DEPARTMENT_OTHER): Payer: Commercial Managed Care - HMO

## 2015-10-13 ENCOUNTER — Ambulatory Visit (HOSPITAL_BASED_OUTPATIENT_CLINIC_OR_DEPARTMENT_OTHER): Payer: Commercial Managed Care - HMO

## 2015-10-13 ENCOUNTER — Telehealth: Payer: Self-pay

## 2015-10-13 ENCOUNTER — Ambulatory Visit: Payer: Commercial Managed Care - HMO

## 2015-10-13 VITALS — BP 160/75 | HR 66 | Temp 98.4°F | Resp 17

## 2015-10-13 DIAGNOSIS — Z5112 Encounter for antineoplastic immunotherapy: Secondary | ICD-10-CM

## 2015-10-13 DIAGNOSIS — Z7901 Long term (current) use of anticoagulants: Secondary | ICD-10-CM | POA: Diagnosis not present

## 2015-10-13 DIAGNOSIS — C50911 Malignant neoplasm of unspecified site of right female breast: Secondary | ICD-10-CM | POA: Diagnosis not present

## 2015-10-13 DIAGNOSIS — C50919 Malignant neoplasm of unspecified site of unspecified female breast: Secondary | ICD-10-CM

## 2015-10-13 DIAGNOSIS — I871 Compression of vein: Secondary | ICD-10-CM | POA: Diagnosis not present

## 2015-10-13 DIAGNOSIS — Z95828 Presence of other vascular implants and grafts: Secondary | ICD-10-CM

## 2015-10-13 DIAGNOSIS — C787 Secondary malignant neoplasm of liver and intrahepatic bile duct: Secondary | ICD-10-CM

## 2015-10-13 DIAGNOSIS — C50812 Malignant neoplasm of overlapping sites of left female breast: Secondary | ICD-10-CM

## 2015-10-13 LAB — COMPREHENSIVE METABOLIC PANEL
ALBUMIN: 3.6 g/dL (ref 3.5–5.0)
ALK PHOS: 83 U/L (ref 40–150)
ALT: 9 U/L (ref 0–55)
AST: 9 U/L (ref 5–34)
Anion Gap: 8 mEq/L (ref 3–11)
BUN: 13.4 mg/dL (ref 7.0–26.0)
CALCIUM: 9.4 mg/dL (ref 8.4–10.4)
CO2: 27 mEq/L (ref 22–29)
CREATININE: 0.9 mg/dL (ref 0.6–1.1)
Chloride: 107 mEq/L (ref 98–109)
EGFR: 83 mL/min/{1.73_m2} — ABNORMAL LOW (ref >90)
Glucose: 112 mg/dl (ref 70–140)
Potassium: 3.9 mEq/L (ref 3.5–5.1)
Sodium: 141 mEq/L (ref 136–145)
TOTAL PROTEIN: 8 g/dL (ref 6.4–8.3)
Total Bilirubin: 0.91 mg/dL (ref 0.20–1.20)

## 2015-10-13 LAB — CBC WITH DIFFERENTIAL/PLATELET
BASO%: 0.2 % (ref 0.0–2.0)
Basophils Absolute: 0 10*3/uL (ref 0.0–0.1)
EOS%: 0.8 % (ref 0.0–7.0)
Eosinophils Absolute: 0.1 10*3/uL (ref 0.0–0.5)
HEMATOCRIT: 35.1 % (ref 34.8–46.6)
HEMOGLOBIN: 12 g/dL (ref 11.6–15.9)
LYMPH%: 20.8 % (ref 14.0–49.7)
MCH: 27.2 pg (ref 25.1–34.0)
MCHC: 34.2 g/dL (ref 31.5–36.0)
MCV: 79.6 fL (ref 79.5–101.0)
MONO#: 0.3 10*3/uL (ref 0.1–0.9)
MONO%: 3.3 % (ref 0.0–14.0)
NEUT#: 6.6 10*3/uL — ABNORMAL HIGH (ref 1.5–6.5)
NEUT%: 74.9 % (ref 38.4–76.8)
Platelets: 249 10*3/uL (ref 145–400)
RBC: 4.41 10*6/uL (ref 3.70–5.45)
RDW: 16.3 % — ABNORMAL HIGH (ref 11.2–14.5)
WBC: 8.9 10*3/uL (ref 3.9–10.3)
lymph#: 1.8 10*3/uL (ref 0.9–3.3)

## 2015-10-13 LAB — PROTIME-INR
INR: 1.4 — ABNORMAL LOW (ref 2.00–3.50)
Protime: 16.8 Seconds — ABNORMAL HIGH (ref 10.6–13.4)

## 2015-10-13 MED ORDER — LORAZEPAM 2 MG/ML IJ SOLN
INTRAMUSCULAR | Status: AC
  Filled 2015-10-13: qty 1

## 2015-10-13 MED ORDER — HEPARIN SOD (PORK) LOCK FLUSH 100 UNIT/ML IV SOLN
500.0000 [IU] | Freq: Once | INTRAVENOUS | Status: AC | PRN
  Administered 2015-10-13: 500 [IU]
  Filled 2015-10-13: qty 5

## 2015-10-13 MED ORDER — TRASTUZUMAB CHEMO INJECTION 440 MG
6.0000 mg/kg | Freq: Once | INTRAVENOUS | Status: AC
  Administered 2015-10-13: 798 mg via INTRAVENOUS
  Filled 2015-10-13: qty 38

## 2015-10-13 MED ORDER — DIPHENHYDRAMINE HCL 25 MG PO CAPS
ORAL_CAPSULE | ORAL | Status: AC
  Filled 2015-10-13: qty 1

## 2015-10-13 MED ORDER — ACETAMINOPHEN 325 MG PO TABS
650.0000 mg | ORAL_TABLET | Freq: Once | ORAL | Status: AC
  Administered 2015-10-13: 650 mg via ORAL

## 2015-10-13 MED ORDER — SODIUM CHLORIDE 0.9 % IJ SOLN
10.0000 mL | INTRAMUSCULAR | Status: DC | PRN
  Administered 2015-10-13: 10 mL
  Filled 2015-10-13: qty 10

## 2015-10-13 MED ORDER — SODIUM CHLORIDE 0.9% FLUSH
10.0000 mL | INTRAVENOUS | Status: DC | PRN
Start: 2015-10-13 — End: 2015-10-13
  Administered 2015-10-13: 10 mL via INTRAVENOUS
  Filled 2015-10-13: qty 10

## 2015-10-13 MED ORDER — DIPHENHYDRAMINE HCL 25 MG PO CAPS
25.0000 mg | ORAL_CAPSULE | Freq: Once | ORAL | Status: AC
  Administered 2015-10-13: 25 mg via ORAL

## 2015-10-13 MED ORDER — LORAZEPAM 2 MG/ML IJ SOLN
1.0000 mg | Freq: Once | INTRAMUSCULAR | Status: AC | PRN
Start: 2015-10-13 — End: 2015-10-13
  Administered 2015-10-13: 0.5 mg via INTRAVENOUS

## 2015-10-13 MED ORDER — ACETAMINOPHEN 325 MG PO TABS
ORAL_TABLET | ORAL | Status: AC
  Filled 2015-10-13: qty 2

## 2015-10-13 MED ORDER — SODIUM CHLORIDE 0.9 % IV SOLN
Freq: Once | INTRAVENOUS | Status: AC
  Administered 2015-10-13: 11:00:00 via INTRAVENOUS

## 2015-10-13 NOTE — Telephone Encounter (Signed)
INR Today- 1.4 Present coumadin dose- 5 mg alt 2.5 mg Per Dr. Jana Hakim patient is to take 5 mg of coumadin daily and recheck INR in 2 weeks.

## 2015-10-13 NOTE — Patient Instructions (Signed)
[facility] Discharge Instructions for Patients Receiving Chemotherapy  Today you received the following chemotherapy agents: Herceptin   To help prevent nausea and vomiting after your treatment, we encourage you to take your nausea medication as directed.    If you develop nausea and vomiting that is not controlled by your nausea medication, call the clinic.   BELOW ARE SYMPTOMS THAT SHOULD BE REPORTED IMMEDIATELY:  *FEVER GREATER THAN 100.5 F  *CHILLS WITH OR WITHOUT FEVER  NAUSEA AND VOMITING THAT IS NOT CONTROLLED WITH YOUR NAUSEA MEDICATION  *UNUSUAL SHORTNESS OF BREATH  *UNUSUAL BRUISING OR BLEEDING  TENDERNESS IN MOUTH AND THROAT WITH OR WITHOUT PRESENCE OF ULCERS  *URINARY PROBLEMS  *BOWEL PROBLEMS  UNUSUAL RASH Items with * indicate a potential emergency and should be followed up as soon as possible.  Feel free to call the clinic you have any questions or concerns. The clinic phone number is [phone].  Please show the CHEMO ALERT CARD at check-in to the Emergency Department and triage nurse.   

## 2015-10-17 ENCOUNTER — Telehealth: Payer: Self-pay | Admitting: Oncology

## 2015-10-17 NOTE — Telephone Encounter (Signed)
Spoke with patient re lab only 3/2. Per patient husband having surgery that day and she cannot come in. Per patient she is aware lab is for INR - cx and she will call back to r/s when she can come in. Left message informing desk nurse. Other appointments remain the same.

## 2015-10-27 ENCOUNTER — Other Ambulatory Visit: Payer: Commercial Managed Care - HMO

## 2015-11-03 ENCOUNTER — Ambulatory Visit: Payer: Commercial Managed Care - HMO

## 2015-11-03 ENCOUNTER — Ambulatory Visit (HOSPITAL_BASED_OUTPATIENT_CLINIC_OR_DEPARTMENT_OTHER): Payer: Commercial Managed Care - HMO

## 2015-11-03 ENCOUNTER — Other Ambulatory Visit (HOSPITAL_BASED_OUTPATIENT_CLINIC_OR_DEPARTMENT_OTHER): Payer: Commercial Managed Care - HMO

## 2015-11-03 VITALS — BP 137/69 | HR 68 | Temp 98.2°F | Resp 18

## 2015-11-03 DIAGNOSIS — C7901 Secondary malignant neoplasm of right kidney and renal pelvis: Secondary | ICD-10-CM

## 2015-11-03 DIAGNOSIS — C50912 Malignant neoplasm of unspecified site of left female breast: Secondary | ICD-10-CM

## 2015-11-03 DIAGNOSIS — C50911 Malignant neoplasm of unspecified site of right female breast: Secondary | ICD-10-CM | POA: Diagnosis not present

## 2015-11-03 DIAGNOSIS — C50919 Malignant neoplasm of unspecified site of unspecified female breast: Secondary | ICD-10-CM

## 2015-11-03 DIAGNOSIS — Z95828 Presence of other vascular implants and grafts: Secondary | ICD-10-CM

## 2015-11-03 DIAGNOSIS — Z5112 Encounter for antineoplastic immunotherapy: Secondary | ICD-10-CM

## 2015-11-03 LAB — CBC WITH DIFFERENTIAL/PLATELET
BASO%: 0.9 % (ref 0.0–2.0)
Basophils Absolute: 0.1 10*3/uL (ref 0.0–0.1)
EOS%: 2.1 % (ref 0.0–7.0)
Eosinophils Absolute: 0.2 10*3/uL (ref 0.0–0.5)
HEMATOCRIT: 35.1 % (ref 34.8–46.6)
HGB: 11.7 g/dL (ref 11.6–15.9)
LYMPH#: 2.6 10*3/uL (ref 0.9–3.3)
LYMPH%: 28 % (ref 14.0–49.7)
MCH: 27.1 pg (ref 25.1–34.0)
MCHC: 33.2 g/dL (ref 31.5–36.0)
MCV: 81.6 fL (ref 79.5–101.0)
MONO#: 0.6 10*3/uL (ref 0.1–0.9)
MONO%: 6.5 % (ref 0.0–14.0)
NEUT%: 62.5 % (ref 38.4–76.8)
NEUTROS ABS: 5.7 10*3/uL (ref 1.5–6.5)
Platelets: 222 10*3/uL (ref 145–400)
RBC: 4.3 10*6/uL (ref 3.70–5.45)
RDW: 15.7 % — ABNORMAL HIGH (ref 11.2–14.5)
WBC: 9.1 10*3/uL (ref 3.9–10.3)

## 2015-11-03 LAB — COMPREHENSIVE METABOLIC PANEL
ANION GAP: 9 meq/L (ref 3–11)
AST: 9 U/L (ref 5–34)
Albumin: 3.5 g/dL (ref 3.5–5.0)
Alkaline Phosphatase: 78 U/L (ref 40–150)
BUN: 16.3 mg/dL (ref 7.0–26.0)
CHLORIDE: 108 meq/L (ref 98–109)
CO2: 25 meq/L (ref 22–29)
Calcium: 9.1 mg/dL (ref 8.4–10.4)
Creatinine: 0.9 mg/dL (ref 0.6–1.1)
EGFR: 75 mL/min/{1.73_m2} — AB (ref >90)
GLUCOSE: 123 mg/dL (ref 70–140)
Potassium: 3.7 mEq/L (ref 3.5–5.1)
SODIUM: 142 meq/L (ref 136–145)
Total Bilirubin: 0.59 mg/dL (ref 0.20–1.20)
Total Protein: 7.5 g/dL (ref 6.4–8.3)

## 2015-11-03 LAB — PROTIME-INR
INR: 2.3 (ref 2.00–3.50)
Protime: 27.6 Seconds — ABNORMAL HIGH (ref 10.6–13.4)

## 2015-11-03 MED ORDER — SODIUM CHLORIDE 0.9 % IJ SOLN
10.0000 mL | INTRAMUSCULAR | Status: DC | PRN
  Administered 2015-11-03: 10 mL
  Filled 2015-11-03: qty 10

## 2015-11-03 MED ORDER — DIPHENHYDRAMINE HCL 25 MG PO CAPS
ORAL_CAPSULE | ORAL | Status: AC
  Filled 2015-11-03: qty 1

## 2015-11-03 MED ORDER — SODIUM CHLORIDE 0.9 % IV SOLN
6.0000 mg/kg | Freq: Once | INTRAVENOUS | Status: AC
  Administered 2015-11-03: 798 mg via INTRAVENOUS
  Filled 2015-11-03: qty 38

## 2015-11-03 MED ORDER — SODIUM CHLORIDE 0.9% FLUSH
10.0000 mL | INTRAVENOUS | Status: DC | PRN
  Administered 2015-11-03: 10 mL via INTRAVENOUS
  Filled 2015-11-03: qty 10

## 2015-11-03 MED ORDER — ACETAMINOPHEN 325 MG PO TABS
650.0000 mg | ORAL_TABLET | Freq: Once | ORAL | Status: AC
  Administered 2015-11-03: 650 mg via ORAL

## 2015-11-03 MED ORDER — ACETAMINOPHEN 325 MG PO TABS
ORAL_TABLET | ORAL | Status: AC
  Filled 2015-11-03: qty 2

## 2015-11-03 MED ORDER — LORAZEPAM 2 MG/ML IJ SOLN
INTRAMUSCULAR | Status: AC
  Filled 2015-11-03: qty 1

## 2015-11-03 MED ORDER — LORAZEPAM 2 MG/ML IJ SOLN
1.0000 mg | Freq: Once | INTRAMUSCULAR | Status: AC | PRN
  Administered 2015-11-03: 1 mg via INTRAVENOUS

## 2015-11-03 MED ORDER — DIPHENHYDRAMINE HCL 25 MG PO CAPS
25.0000 mg | ORAL_CAPSULE | Freq: Once | ORAL | Status: AC
  Administered 2015-11-03: 25 mg via ORAL

## 2015-11-03 MED ORDER — HEPARIN SOD (PORK) LOCK FLUSH 100 UNIT/ML IV SOLN
500.0000 [IU] | Freq: Once | INTRAVENOUS | Status: AC | PRN
  Administered 2015-11-03: 500 [IU]
  Filled 2015-11-03: qty 5

## 2015-11-03 MED ORDER — SODIUM CHLORIDE 0.9 % IV SOLN
Freq: Once | INTRAVENOUS | Status: AC
  Administered 2015-11-03: 10:00:00 via INTRAVENOUS

## 2015-11-03 NOTE — Patient Instructions (Signed)

## 2015-11-04 ENCOUNTER — Other Ambulatory Visit (HOSPITAL_COMMUNITY): Payer: Self-pay | Admitting: Cardiology

## 2015-11-07 ENCOUNTER — Other Ambulatory Visit: Payer: Self-pay | Admitting: Oncology

## 2015-11-18 ENCOUNTER — Telehealth (HOSPITAL_COMMUNITY): Payer: Self-pay | Admitting: Vascular Surgery

## 2015-11-18 NOTE — Telephone Encounter (Signed)
Left pt message to make f/u appt w/ ECHO in May

## 2015-11-24 ENCOUNTER — Ambulatory Visit (HOSPITAL_BASED_OUTPATIENT_CLINIC_OR_DEPARTMENT_OTHER): Payer: Commercial Managed Care - HMO

## 2015-11-24 ENCOUNTER — Ambulatory Visit: Payer: Commercial Managed Care - HMO

## 2015-11-24 ENCOUNTER — Telehealth: Payer: Self-pay | Admitting: Oncology

## 2015-11-24 ENCOUNTER — Telehealth: Payer: Self-pay | Admitting: *Deleted

## 2015-11-24 ENCOUNTER — Other Ambulatory Visit (HOSPITAL_BASED_OUTPATIENT_CLINIC_OR_DEPARTMENT_OTHER): Payer: Commercial Managed Care - HMO

## 2015-11-24 DIAGNOSIS — C50911 Malignant neoplasm of unspecified site of right female breast: Secondary | ICD-10-CM

## 2015-11-24 DIAGNOSIS — Z95828 Presence of other vascular implants and grafts: Secondary | ICD-10-CM

## 2015-11-24 DIAGNOSIS — Z5112 Encounter for antineoplastic immunotherapy: Secondary | ICD-10-CM | POA: Diagnosis not present

## 2015-11-24 DIAGNOSIS — C50812 Malignant neoplasm of overlapping sites of left female breast: Secondary | ICD-10-CM

## 2015-11-24 DIAGNOSIS — C50919 Malignant neoplasm of unspecified site of unspecified female breast: Secondary | ICD-10-CM

## 2015-11-24 DIAGNOSIS — Z7901 Long term (current) use of anticoagulants: Secondary | ICD-10-CM | POA: Diagnosis not present

## 2015-11-24 DIAGNOSIS — I871 Compression of vein: Secondary | ICD-10-CM

## 2015-11-24 LAB — COMPREHENSIVE METABOLIC PANEL
ALT: 14 U/L (ref 0–55)
AST: 15 U/L (ref 5–34)
Albumin: 3.6 g/dL (ref 3.5–5.0)
Alkaline Phosphatase: 83 U/L (ref 40–150)
Anion Gap: 10 mEq/L (ref 3–11)
BUN: 17.3 mg/dL (ref 7.0–26.0)
CO2: 28 meq/L (ref 22–29)
Calcium: 9.6 mg/dL (ref 8.4–10.4)
Chloride: 106 mEq/L (ref 98–109)
Creatinine: 1.1 mg/dL (ref 0.6–1.1)
EGFR: 62 mL/min/{1.73_m2} — AB (ref >90)
GLUCOSE: 104 mg/dL (ref 70–140)
POTASSIUM: 3.3 meq/L — AB (ref 3.5–5.1)
SODIUM: 144 meq/L (ref 136–145)
TOTAL PROTEIN: 8.2 g/dL (ref 6.4–8.3)
Total Bilirubin: 0.82 mg/dL (ref 0.20–1.20)

## 2015-11-24 LAB — CBC WITH DIFFERENTIAL/PLATELET
BASO%: 0.2 % (ref 0.0–2.0)
Basophils Absolute: 0 10*3/uL (ref 0.0–0.1)
EOS ABS: 0.1 10*3/uL (ref 0.0–0.5)
EOS%: 1.4 % (ref 0.0–7.0)
HEMATOCRIT: 35.8 % (ref 34.8–46.6)
HEMOGLOBIN: 12.3 g/dL (ref 11.6–15.9)
LYMPH%: 31.2 % (ref 14.0–49.7)
MCH: 27.4 pg (ref 25.1–34.0)
MCHC: 34.4 g/dL (ref 31.5–36.0)
MCV: 79.7 fL (ref 79.5–101.0)
MONO#: 0.8 10*3/uL (ref 0.1–0.9)
MONO%: 8.8 % (ref 0.0–14.0)
NEUT%: 58.4 % (ref 38.4–76.8)
NEUTROS ABS: 5.2 10*3/uL (ref 1.5–6.5)
Platelets: 280 10*3/uL (ref 145–400)
RBC: 4.49 10*6/uL (ref 3.70–5.45)
RDW: 15.4 % — AB (ref 11.2–14.5)
WBC: 8.9 10*3/uL (ref 3.9–10.3)
lymph#: 2.8 10*3/uL (ref 0.9–3.3)

## 2015-11-24 LAB — PROTIME-INR
INR: 1.7 — AB (ref 2.00–3.50)
PROTIME: 20.4 s — AB (ref 10.6–13.4)

## 2015-11-24 MED ORDER — LORAZEPAM 2 MG/ML IJ SOLN
INTRAMUSCULAR | Status: AC
  Filled 2015-11-24: qty 1

## 2015-11-24 MED ORDER — LORAZEPAM 2 MG/ML IJ SOLN
1.0000 mg | Freq: Once | INTRAMUSCULAR | Status: AC | PRN
  Administered 2015-11-24: 1 mg via INTRAVENOUS

## 2015-11-24 MED ORDER — HEPARIN SOD (PORK) LOCK FLUSH 100 UNIT/ML IV SOLN
500.0000 [IU] | Freq: Once | INTRAVENOUS | Status: AC | PRN
  Administered 2015-11-24: 500 [IU]
  Filled 2015-11-24: qty 5

## 2015-11-24 MED ORDER — DIPHENHYDRAMINE HCL 25 MG PO CAPS
ORAL_CAPSULE | ORAL | Status: AC
  Filled 2015-11-24: qty 1

## 2015-11-24 MED ORDER — ACETAMINOPHEN 325 MG PO TABS
ORAL_TABLET | ORAL | Status: AC
  Filled 2015-11-24: qty 2

## 2015-11-24 MED ORDER — ACETAMINOPHEN 325 MG PO TABS
650.0000 mg | ORAL_TABLET | Freq: Once | ORAL | Status: AC
  Administered 2015-11-24: 650 mg via ORAL

## 2015-11-24 MED ORDER — SODIUM CHLORIDE 0.9% FLUSH
10.0000 mL | INTRAVENOUS | Status: DC | PRN
  Administered 2015-11-24: 10 mL via INTRAVENOUS
  Filled 2015-11-24: qty 10

## 2015-11-24 MED ORDER — SODIUM CHLORIDE 0.9 % IV SOLN
6.0000 mg/kg | Freq: Once | INTRAVENOUS | Status: AC
  Administered 2015-11-24: 798 mg via INTRAVENOUS
  Filled 2015-11-24: qty 38

## 2015-11-24 MED ORDER — SODIUM CHLORIDE 0.9 % IV SOLN
Freq: Once | INTRAVENOUS | Status: AC
  Administered 2015-11-24: 10:00:00 via INTRAVENOUS

## 2015-11-24 MED ORDER — DIPHENHYDRAMINE HCL 25 MG PO CAPS
25.0000 mg | ORAL_CAPSULE | Freq: Once | ORAL | Status: AC
  Administered 2015-11-24: 25 mg via ORAL

## 2015-11-24 MED ORDER — SODIUM CHLORIDE 0.9 % IJ SOLN
10.0000 mL | INTRAMUSCULAR | Status: DC | PRN
  Administered 2015-11-24: 10 mL
  Filled 2015-11-24: qty 10

## 2015-11-24 NOTE — Telephone Encounter (Signed)
Added lab appt per 3/30 pof. Pt will get avs printed in treatment room

## 2015-11-24 NOTE — Patient Instructions (Signed)
[facility] Discharge Instructions for Patients Receiving Chemotherapy  Today you received the following chemotherapy agents: Herceptin.  To help prevent nausea and vomiting after your treatment, we encourage you to take your nausea medication: as directed.   If you develop nausea and vomiting that is not controlled by your nausea medication, call the clinic.   BELOW ARE SYMPTOMS THAT SHOULD BE REPORTED IMMEDIATELY:  *FEVER GREATER THAN 100.5 F  *CHILLS WITH OR WITHOUT FEVER  NAUSEA AND VOMITING THAT IS NOT CONTROLLED WITH YOUR NAUSEA MEDICATION  *UNUSUAL SHORTNESS OF BREATH  *UNUSUAL BRUISING OR BLEEDING  TENDERNESS IN MOUTH AND THROAT WITH OR WITHOUT PRESENCE OF ULCERS  *URINARY PROBLEMS  *BOWEL PROBLEMS  UNUSUAL RASH Items with * indicate a potential emergency and should be followed up as soon as possible.  Feel free to call the clinic you have any questions or concerns. The clinic phone number is [phone].  Please show the [place] at check-in to the Emergency Department and triage nurse.     COUMADIN 7.5 MG ON Thursday AND Friday. RESUME 5 MG FOR NEXT 5 DAYS. RECHECK PT/INR IN 1 WEEK.

## 2015-11-24 NOTE — Patient Instructions (Signed)

## 2015-11-24 NOTE — Telephone Encounter (Signed)
INR 1.7  Current dose coumadin 5 mg daily  Pt states no missed doses but " am eating a lot of greens "  Per provider pt is to take 7.5 mg x 2 days then resume 5 mg daily.  Recheck in 1 week.  Above discussed with pt with verbalized understanding.  POF for appointment sent to scheduling.

## 2015-12-01 ENCOUNTER — Encounter: Payer: Self-pay | Admitting: *Deleted

## 2015-12-01 ENCOUNTER — Other Ambulatory Visit (HOSPITAL_BASED_OUTPATIENT_CLINIC_OR_DEPARTMENT_OTHER): Payer: Commercial Managed Care - HMO

## 2015-12-01 DIAGNOSIS — C50911 Malignant neoplasm of unspecified site of right female breast: Secondary | ICD-10-CM | POA: Diagnosis not present

## 2015-12-01 DIAGNOSIS — Z7901 Long term (current) use of anticoagulants: Secondary | ICD-10-CM

## 2015-12-01 DIAGNOSIS — C50919 Malignant neoplasm of unspecified site of unspecified female breast: Secondary | ICD-10-CM

## 2015-12-01 DIAGNOSIS — C787 Secondary malignant neoplasm of liver and intrahepatic bile duct: Secondary | ICD-10-CM | POA: Diagnosis not present

## 2015-12-01 LAB — COMPREHENSIVE METABOLIC PANEL
ALBUMIN: 3.6 g/dL (ref 3.5–5.0)
ALK PHOS: 76 U/L (ref 40–150)
ALT: 11 U/L (ref 0–55)
AST: 14 U/L (ref 5–34)
Anion Gap: 8 mEq/L (ref 3–11)
BILIRUBIN TOTAL: 0.64 mg/dL (ref 0.20–1.20)
BUN: 18.9 mg/dL (ref 7.0–26.0)
CO2: 26 meq/L (ref 22–29)
Calcium: 9.8 mg/dL (ref 8.4–10.4)
Chloride: 108 mEq/L (ref 98–109)
Creatinine: 1 mg/dL (ref 0.6–1.1)
EGFR: 72 mL/min/{1.73_m2} — ABNORMAL LOW (ref >90)
GLUCOSE: 90 mg/dL (ref 70–140)
Potassium: 4.3 mEq/L (ref 3.5–5.1)
SODIUM: 143 meq/L (ref 136–145)
TOTAL PROTEIN: 8.2 g/dL (ref 6.4–8.3)

## 2015-12-01 LAB — CBC WITH DIFFERENTIAL/PLATELET
BASO%: 1.3 % (ref 0.0–2.0)
Basophils Absolute: 0.1 10*3/uL (ref 0.0–0.1)
EOS ABS: 0.2 10*3/uL (ref 0.0–0.5)
EOS%: 2.7 % (ref 0.0–7.0)
HCT: 37.5 % (ref 34.8–46.6)
HGB: 12.3 g/dL (ref 11.6–15.9)
LYMPH%: 37.4 % (ref 14.0–49.7)
MCH: 26.7 pg (ref 25.1–34.0)
MCHC: 32.7 g/dL (ref 31.5–36.0)
MCV: 81.6 fL (ref 79.5–101.0)
MONO#: 0.6 10*3/uL (ref 0.1–0.9)
MONO%: 8.3 % (ref 0.0–14.0)
NEUT#: 3.7 10*3/uL (ref 1.5–6.5)
NEUT%: 50.3 % (ref 38.4–76.8)
Platelets: 251 10*3/uL (ref 145–400)
RBC: 4.59 10*6/uL (ref 3.70–5.45)
RDW: 15.3 % — ABNORMAL HIGH (ref 11.2–14.5)
WBC: 7.4 10*3/uL (ref 3.9–10.3)
lymph#: 2.8 10*3/uL (ref 0.9–3.3)

## 2015-12-01 LAB — PROTIME-INR
INR: 3.5 (ref 2.00–3.50)
Protime: 42 Seconds — ABNORMAL HIGH (ref 10.6–13.4)

## 2015-12-01 NOTE — Progress Notes (Signed)
Request for medical records per Dr Forbes Cellar faxed to [phone].

## 2015-12-05 ENCOUNTER — Telehealth: Payer: Self-pay | Admitting: *Deleted

## 2015-12-05 DIAGNOSIS — C50919 Malignant neoplasm of unspecified site of unspecified female breast: Secondary | ICD-10-CM

## 2015-12-05 NOTE — Telephone Encounter (Signed)
Pt will continue on 5 mg coumadin per MD.  Pt aware.

## 2015-12-05 NOTE — Telephone Encounter (Signed)
INR 3.5 post increasing dose per prior low INR with 7.5 mg x 2 days then 5 mg daily.  Coumadin 5 mg daily  Next lab 4/20.  Per MD pt is to continue 5 mg daily until 4/20.  Pt contacted and above discussed.

## 2015-12-12 ENCOUNTER — Other Ambulatory Visit: Payer: Self-pay | Admitting: Nurse Practitioner

## 2015-12-14 ENCOUNTER — Telehealth: Payer: Self-pay | Admitting: *Deleted

## 2015-12-14 ENCOUNTER — Other Ambulatory Visit: Payer: Self-pay | Admitting: *Deleted

## 2015-12-14 DIAGNOSIS — C50919 Malignant neoplasm of unspecified site of unspecified female breast: Secondary | ICD-10-CM

## 2015-12-14 MED ORDER — ALPRAZOLAM 1 MG PO TABS: 1.0000 mg | ORAL_TABLET | Freq: Three times a day (TID) | ORAL | Status: DC | PRN

## 2015-12-14 MED ORDER — TEMAZEPAM 30 MG PO CAPS: ORAL_CAPSULE | ORAL | Status: DC

## 2015-12-14 NOTE — Telephone Encounter (Signed)
Called and spoke to pt to make her aware that Rx's has been called in to her pharmacy and she can pick them up today. Pt verbalized understanding. No further concerns.

## 2015-12-14 NOTE — Telephone Encounter (Signed)
"  My Pharmacy sent request for two refills three days ago and have not received response yet.  Is there a problem with the prescription?  I am to come tomorrow for treatment and do not know what time xray is scheduled." Apologized for the delay.  Will notify staff of request sent to [facility].  Xray to knee can be done as a walk in.  Says she will go after treatment.

## 2015-12-15 ENCOUNTER — Ambulatory Visit: Payer: Self-pay

## 2015-12-15 ENCOUNTER — Ambulatory Visit: Payer: Commercial Managed Care - HMO

## 2015-12-15 ENCOUNTER — Ambulatory Visit (HOSPITAL_COMMUNITY)
Admission: RE | Admit: 2015-12-15 | Discharge: 2015-12-15 | Disposition: A | Discharge status: Home / Self Care | Payer: Commercial Managed Care - HMO | Source: Ambulatory Visit | Attending: Oncology | Admitting: Oncology

## 2015-12-15 ENCOUNTER — Ambulatory Visit (HOSPITAL_BASED_OUTPATIENT_CLINIC_OR_DEPARTMENT_OTHER): Payer: Commercial Managed Care - HMO

## 2015-12-15 ENCOUNTER — Other Ambulatory Visit (HOSPITAL_BASED_OUTPATIENT_CLINIC_OR_DEPARTMENT_OTHER): Payer: Commercial Managed Care - HMO

## 2015-12-15 DIAGNOSIS — M25561 Pain in right knee: Secondary | ICD-10-CM | POA: Diagnosis not present

## 2015-12-15 DIAGNOSIS — Z7901 Long term (current) use of anticoagulants: Secondary | ICD-10-CM

## 2015-12-15 DIAGNOSIS — C50812 Malignant neoplasm of overlapping sites of left female breast: Secondary | ICD-10-CM | POA: Diagnosis not present

## 2015-12-15 DIAGNOSIS — M170 Bilateral primary osteoarthritis of knee: Secondary | ICD-10-CM | POA: Insufficient documentation

## 2015-12-15 DIAGNOSIS — C50911 Malignant neoplasm of unspecified site of right female breast: Secondary | ICD-10-CM | POA: Diagnosis not present

## 2015-12-15 DIAGNOSIS — Z5112 Encounter for antineoplastic immunotherapy: Secondary | ICD-10-CM

## 2015-12-15 DIAGNOSIS — C50919 Malignant neoplasm of unspecified site of unspecified female breast: Secondary | ICD-10-CM

## 2015-12-15 DIAGNOSIS — M25562 Pain in left knee: Secondary | ICD-10-CM | POA: Insufficient documentation

## 2015-12-15 DIAGNOSIS — I871 Compression of vein: Secondary | ICD-10-CM | POA: Diagnosis not present

## 2015-12-15 DIAGNOSIS — Z95828 Presence of other vascular implants and grafts: Secondary | ICD-10-CM

## 2015-12-15 LAB — CBC WITH DIFFERENTIAL/PLATELET
BASO%: 0.4 % (ref 0.0–2.0)
BASOS ABS: 0 10*3/uL (ref 0.0–0.1)
EOS%: 2.7 % (ref 0.0–7.0)
Eosinophils Absolute: 0.2 10*3/uL (ref 0.0–0.5)
HEMATOCRIT: 35.5 % (ref 34.8–46.6)
HEMOGLOBIN: 11.7 g/dL (ref 11.6–15.9)
LYMPH#: 2.6 10*3/uL (ref 0.9–3.3)
LYMPH%: 32.9 % (ref 14.0–49.7)
MCH: 26.9 pg (ref 25.1–34.0)
MCHC: 33 g/dL (ref 31.5–36.0)
MCV: 81.5 fL (ref 79.5–101.0)
MONO#: 0.7 10*3/uL (ref 0.1–0.9)
MONO%: 9.4 % (ref 0.0–14.0)
NEUT%: 54.6 % (ref 38.4–76.8)
NEUTROS ABS: 4.3 10*3/uL (ref 1.5–6.5)
Platelets: 224 10*3/uL (ref 145–400)
RBC: 4.36 10*6/uL (ref 3.70–5.45)
RDW: 15.3 % — AB (ref 11.2–14.5)
WBC: 7.9 10*3/uL (ref 3.9–10.3)

## 2015-12-15 LAB — COMPREHENSIVE METABOLIC PANEL
ALK PHOS: 83 U/L (ref 40–150)
ALT: 11 U/L (ref 0–55)
AST: 13 U/L (ref 5–34)
Albumin: 3.5 g/dL (ref 3.5–5.0)
Anion Gap: 8 mEq/L (ref 3–11)
BUN: 15.6 mg/dL (ref 7.0–26.0)
CO2: 25 mEq/L (ref 22–29)
CREATININE: 0.9 mg/dL (ref 0.6–1.1)
Calcium: 9.3 mg/dL (ref 8.4–10.4)
Chloride: 108 mEq/L (ref 98–109)
EGFR: 78 mL/min/{1.73_m2} — ABNORMAL LOW (ref >90)
GLUCOSE: 94 mg/dL (ref 70–140)
Potassium: 4.1 mEq/L (ref 3.5–5.1)
Sodium: 141 mEq/L (ref 136–145)
TOTAL PROTEIN: 7.8 g/dL (ref 6.4–8.3)
Total Bilirubin: 0.71 mg/dL (ref 0.20–1.20)

## 2015-12-15 LAB — PROTIME-INR
INR: 2.3 (ref 2.00–3.50)
Protime: 27.6 Seconds — ABNORMAL HIGH (ref 10.6–13.4)

## 2015-12-15 MED ORDER — TRASTUZUMAB CHEMO INJECTION 440 MG
6.0000 mg/kg | Freq: Once | INTRAVENOUS | Status: AC
  Administered 2015-12-15: 798 mg via INTRAVENOUS
  Filled 2015-12-15: qty 38

## 2015-12-15 MED ORDER — LORAZEPAM 2 MG/ML IJ SOLN
INTRAMUSCULAR | Status: AC
  Filled 2015-12-15: qty 1

## 2015-12-15 MED ORDER — ACETAMINOPHEN 325 MG PO TABS
650.0000 mg | ORAL_TABLET | Freq: Once | ORAL | Status: AC
  Administered 2015-12-15: 650 mg via ORAL

## 2015-12-15 MED ORDER — HEPARIN SOD (PORK) LOCK FLUSH 100 UNIT/ML IV SOLN
500.0000 [IU] | Freq: Once | INTRAVENOUS | Status: AC | PRN
  Administered 2015-12-15: 500 [IU]
  Filled 2015-12-15: qty 5

## 2015-12-15 MED ORDER — LORAZEPAM 2 MG/ML IJ SOLN
1.0000 mg | Freq: Once | INTRAMUSCULAR | Status: AC | PRN
  Administered 2015-12-15: 1 mg via INTRAVENOUS

## 2015-12-15 MED ORDER — DIPHENHYDRAMINE HCL 25 MG PO CAPS
ORAL_CAPSULE | ORAL | Status: AC
  Filled 2015-12-15: qty 2

## 2015-12-15 MED ORDER — SODIUM CHLORIDE 0.9 % IV SOLN
Freq: Once | INTRAVENOUS | Status: AC
  Administered 2015-12-15: 11:00:00 via INTRAVENOUS

## 2015-12-15 MED ORDER — DIPHENHYDRAMINE HCL 25 MG PO CAPS
25.0000 mg | ORAL_CAPSULE | Freq: Once | ORAL | Status: AC
  Administered 2015-12-15: 25 mg via ORAL

## 2015-12-15 MED ORDER — SODIUM CHLORIDE 0.9% FLUSH
10.0000 mL | INTRAVENOUS | Status: AC | PRN
  Administered 2015-12-15 (×2): 10 mL via INTRAVENOUS
  Filled 2015-12-15: qty 10

## 2015-12-15 MED ORDER — ACETAMINOPHEN 325 MG PO TABS
ORAL_TABLET | ORAL | Status: AC
  Filled 2015-12-15: qty 2

## 2015-12-15 MED ORDER — SODIUM CHLORIDE 0.9 % IJ SOLN
10.0000 mL | INTRAMUSCULAR | Status: DC | PRN
  Filled 2015-12-15: qty 10

## 2015-12-15 NOTE — Progress Notes (Signed)
INR- 2.3 Taking 5 mg of coumadin daily. Per Dr. Jana Hakim patient to stay on same dose LVM for patient and encouraged her to call with any questions.  Writer reminder her of that she has a repeat lab on 01/05/16.

## 2015-12-15 NOTE — Patient Instructions (Signed)
[name] Discharge Instructions for Patients Receiving Chemotherapy  Today you received the following chemotherapy agents: Herceptin.  To help prevent nausea and vomiting after your treatment, we encourage you to take your nausea medication: as directed.   If you develop nausea and vomiting that is not controlled by your nausea medication, call the clinic.   BELOW ARE SYMPTOMS THAT SHOULD BE REPORTED IMMEDIATELY:  *FEVER GREATER THAN 100.5 F  *CHILLS WITH OR WITHOUT FEVER  NAUSEA AND VOMITING THAT IS NOT CONTROLLED WITH YOUR NAUSEA MEDICATION  *UNUSUAL SHORTNESS OF BREATH  *UNUSUAL BRUISING OR BLEEDING  TENDERNESS IN MOUTH AND THROAT WITH OR WITHOUT PRESENCE OF ULCERS  *URINARY PROBLEMS  *BOWEL PROBLEMS  UNUSUAL RASH Items with * indicate a potential emergency and should be followed up as soon as possible.  Feel free to call the clinic you have any questions or concerns. The clinic phone number is [phone].  Please show the [place] at check-in to the Emergency Department and triage nurse.     COUMADIN 7.5 MG ON Thursday AND Friday. RESUME 5 MG FOR NEXT 5 DAYS. RECHECK PT/INR IN 1 WEEK.

## 2015-12-15 NOTE — Patient Instructions (Signed)

## 2015-12-15 NOTE — Addendum Note (Signed)
Addended by: Cora Collum on: 12/15/2015 10:21 AM   Modules accepted: Orders, SmartSet

## 2015-12-15 NOTE — Progress Notes (Signed)
Pt wanted labs drawn from arm instead of port. Pt arrived to MD appointment per Lab.

## 2015-12-19 ENCOUNTER — Encounter: Payer: Self-pay | Admitting: Oncology

## 2015-12-19 ENCOUNTER — Other Ambulatory Visit: Payer: Self-pay | Admitting: Oncology

## 2015-12-27 ENCOUNTER — Other Ambulatory Visit: Payer: Self-pay | Admitting: Nurse Practitioner

## 2016-01-05 ENCOUNTER — Other Ambulatory Visit (HOSPITAL_BASED_OUTPATIENT_CLINIC_OR_DEPARTMENT_OTHER): Payer: Commercial Managed Care - HMO

## 2016-01-05 ENCOUNTER — Ambulatory Visit (HOSPITAL_BASED_OUTPATIENT_CLINIC_OR_DEPARTMENT_OTHER): Payer: Commercial Managed Care - HMO

## 2016-01-05 ENCOUNTER — Ambulatory Visit: Payer: Commercial Managed Care - HMO

## 2016-01-05 VITALS — BP 150/73 | HR 70 | Temp 98.8°F | Resp 18 | Wt 291.4 lb

## 2016-01-05 DIAGNOSIS — Z5112 Encounter for antineoplastic immunotherapy: Secondary | ICD-10-CM

## 2016-01-05 DIAGNOSIS — C50911 Malignant neoplasm of unspecified site of right female breast: Secondary | ICD-10-CM

## 2016-01-05 DIAGNOSIS — I871 Compression of vein: Secondary | ICD-10-CM | POA: Diagnosis not present

## 2016-01-05 DIAGNOSIS — Z95828 Presence of other vascular implants and grafts: Secondary | ICD-10-CM

## 2016-01-05 DIAGNOSIS — C50812 Malignant neoplasm of overlapping sites of left female breast: Secondary | ICD-10-CM | POA: Diagnosis not present

## 2016-01-05 DIAGNOSIS — C50919 Malignant neoplasm of unspecified site of unspecified female breast: Secondary | ICD-10-CM

## 2016-01-05 LAB — CBC WITH DIFFERENTIAL/PLATELET
BASO%: 1.1 % (ref 0.0–2.0)
Basophils Absolute: 0.1 10*3/uL (ref 0.0–0.1)
EOS%: 1.8 % (ref 0.0–7.0)
Eosinophils Absolute: 0.2 10*3/uL (ref 0.0–0.5)
HCT: 37.6 % (ref 34.8–46.6)
HGB: 12.7 g/dL (ref 11.6–15.9)
LYMPH#: 2.7 10*3/uL (ref 0.9–3.3)
LYMPH%: 29.2 % (ref 14.0–49.7)
MCH: 26.9 pg (ref 25.1–34.0)
MCHC: 33.7 g/dL (ref 31.5–36.0)
MCV: 79.8 fL (ref 79.5–101.0)
MONO#: 0.6 10*3/uL (ref 0.1–0.9)
MONO%: 6.7 % (ref 0.0–14.0)
NEUT#: 5.6 10*3/uL (ref 1.5–6.5)
NEUT%: 61.2 % (ref 38.4–76.8)
Platelets: 289 10*3/uL (ref 145–400)
RBC: 4.71 10*6/uL (ref 3.70–5.45)
RDW: 15.2 % — ABNORMAL HIGH (ref 11.2–14.5)
WBC: 9.2 10*3/uL (ref 3.9–10.3)

## 2016-01-05 LAB — COMPREHENSIVE METABOLIC PANEL
ALT: 11 U/L (ref 0–55)
AST: 11 U/L (ref 5–34)
Albumin: 3.7 g/dL (ref 3.5–5.0)
Alkaline Phosphatase: 88 U/L (ref 40–150)
Anion Gap: 9 mEq/L (ref 3–11)
BUN: 15.4 mg/dL (ref 7.0–26.0)
CHLORIDE: 106 meq/L (ref 98–109)
CO2: 27 meq/L (ref 22–29)
CREATININE: 1 mg/dL (ref 0.6–1.1)
Calcium: 9.7 mg/dL (ref 8.4–10.4)
EGFR: 71 mL/min/{1.73_m2} — ABNORMAL LOW (ref >90)
Glucose: 104 mg/dl (ref 70–140)
Potassium: 3.9 mEq/L (ref 3.5–5.1)
Sodium: 142 mEq/L (ref 136–145)
Total Bilirubin: 0.68 mg/dL (ref 0.20–1.20)
Total Protein: 8.5 g/dL — ABNORMAL HIGH (ref 6.4–8.3)

## 2016-01-05 LAB — PROTIME-INR
INR: 3 (ref 2.00–3.50)
Protime: 36 Seconds — ABNORMAL HIGH (ref 10.6–13.4)

## 2016-01-05 MED ORDER — DIPHENHYDRAMINE HCL 25 MG PO CAPS
ORAL_CAPSULE | ORAL | Status: AC
  Filled 2016-01-05: qty 1

## 2016-01-05 MED ORDER — ACETAMINOPHEN 325 MG PO TABS
ORAL_TABLET | ORAL | Status: AC
  Filled 2016-01-05: qty 2

## 2016-01-05 MED ORDER — TRASTUZUMAB CHEMO INJECTION 440 MG
6.0000 mg/kg | Freq: Once | INTRAVENOUS | Status: AC
  Administered 2016-01-05: 798 mg via INTRAVENOUS
  Filled 2016-01-05: qty 38

## 2016-01-05 MED ORDER — SODIUM CHLORIDE 0.9 % IJ SOLN
10.0000 mL | INTRAMUSCULAR | Status: DC | PRN
  Administered 2016-01-05: 10 mL
  Filled 2016-01-05: qty 10

## 2016-01-05 MED ORDER — ACETAMINOPHEN 325 MG PO TABS
650.0000 mg | ORAL_TABLET | Freq: Once | ORAL | Status: AC
  Administered 2016-01-05: 650 mg via ORAL

## 2016-01-05 MED ORDER — LORAZEPAM 2 MG/ML IJ SOLN
1.0000 mg | Freq: Once | INTRAMUSCULAR | Status: AC | PRN
  Administered 2016-01-05: 1 mg via INTRAVENOUS

## 2016-01-05 MED ORDER — SODIUM CHLORIDE 0.9 % IV SOLN
Freq: Once | INTRAVENOUS | Status: AC
  Administered 2016-01-05: 10:00:00 via INTRAVENOUS

## 2016-01-05 MED ORDER — LORAZEPAM 2 MG/ML IJ SOLN
INTRAMUSCULAR | Status: AC
  Filled 2016-01-05: qty 1

## 2016-01-05 MED ORDER — DIPHENHYDRAMINE HCL 25 MG PO CAPS
25.0000 mg | ORAL_CAPSULE | Freq: Once | ORAL | Status: AC
  Administered 2016-01-05: 25 mg via ORAL

## 2016-01-05 MED ORDER — SODIUM CHLORIDE 0.9% FLUSH
10.0000 mL | INTRAVENOUS | Status: DC | PRN
  Administered 2016-01-05: 10 mL via INTRAVENOUS
  Filled 2016-01-05: qty 10

## 2016-01-05 MED ORDER — HEPARIN SOD (PORK) LOCK FLUSH 100 UNIT/ML IV SOLN
500.0000 [IU] | Freq: Once | INTRAVENOUS | Status: AC | PRN
  Administered 2016-01-05: 500 [IU]
  Filled 2016-01-05: qty 5

## 2016-01-05 NOTE — Patient Instructions (Signed)
[facility] Discharge Instructions for Patients Receiving Chemotherapy  Today you received the following chemotherapy agents: Herceptin   To help prevent nausea and vomiting after your treatment, we encourage you to take your nausea medication as directed.    If you develop nausea and vomiting that is not controlled by your nausea medication, call the clinic.   BELOW ARE SYMPTOMS THAT SHOULD BE REPORTED IMMEDIATELY:  *FEVER GREATER THAN 100.5 F  *CHILLS WITH OR WITHOUT FEVER  NAUSEA AND VOMITING THAT IS NOT CONTROLLED WITH YOUR NAUSEA MEDICATION  *UNUSUAL SHORTNESS OF BREATH  *UNUSUAL BRUISING OR BLEEDING  TENDERNESS IN MOUTH AND THROAT WITH OR WITHOUT PRESENCE OF ULCERS  *URINARY PROBLEMS  *BOWEL PROBLEMS  UNUSUAL RASH Items with * indicate a potential emergency and should be followed up as soon as possible.  Feel free to call the clinic you have any questions or concerns. The clinic phone number is [phone].  Please show the CHEMO ALERT CARD at check-in to the Emergency Department and triage nurse.   

## 2016-01-06 ENCOUNTER — Telehealth: Payer: Self-pay | Admitting: *Deleted

## 2016-01-06 ENCOUNTER — Other Ambulatory Visit: Payer: Self-pay | Admitting: *Deleted

## 2016-01-06 NOTE — Telephone Encounter (Signed)
INR 30.  Coumadin dose 5 mg daily  Next lab scheduled 01/17/2016  No change in current dose recheck as scheduled.  This RN spoke with pt per above.

## 2016-01-10 ENCOUNTER — Ambulatory Visit (HOSPITAL_BASED_OUTPATIENT_CLINIC_OR_DEPARTMENT_OTHER)
Admission: RE | Admit: 2016-01-10 | Discharge: 2016-01-10 | Disposition: A | Discharge status: Home / Self Care | Payer: Commercial Managed Care - HMO | Source: Ambulatory Visit | Attending: Cardiology | Admitting: Cardiology

## 2016-01-10 ENCOUNTER — Ambulatory Visit (HOSPITAL_COMMUNITY)
Admission: RE | Admit: 2016-01-10 | Discharge: 2016-01-10 | Disposition: A | Discharge status: Home / Self Care | Payer: Commercial Managed Care - HMO | Source: Ambulatory Visit | Attending: Internal Medicine | Admitting: Internal Medicine

## 2016-01-10 VITALS — BP 112/72 | HR 75 | Resp 18 | Wt 289.0 lb

## 2016-01-10 DIAGNOSIS — I110 Hypertensive heart disease with heart failure: Secondary | ICD-10-CM | POA: Diagnosis not present

## 2016-01-10 DIAGNOSIS — I509 Heart failure, unspecified: Secondary | ICD-10-CM | POA: Insufficient documentation

## 2016-01-10 DIAGNOSIS — C50919 Malignant neoplasm of unspecified site of unspecified female breast: Secondary | ICD-10-CM

## 2016-01-10 DIAGNOSIS — Z09 Encounter for follow-up examination after completed treatment for conditions other than malignant neoplasm: Secondary | ICD-10-CM | POA: Diagnosis present

## 2016-01-10 DIAGNOSIS — I340 Nonrheumatic mitral (valve) insufficiency: Secondary | ICD-10-CM | POA: Insufficient documentation

## 2016-01-10 NOTE — Progress Notes (Signed)
*  PRELIMINARY RESULTS* Echocardiogram 2D Echocardiogram has been performed.  Leavy Cella 01/10/2016, 11:03 AM

## 2016-01-11 NOTE — Progress Notes (Signed)
Advanced Heart Failure Clinic Note   Patient ID: FLORNCE RECORD, female   DOB: 01/04/1951, 65 y.o.   MRN: [id number] Oncologist: Dr Jana Hakim  HPI: [name] is a 65 year old with a history of metastatic HER-2 positive breast carcinoma originally diagnosed September 2004. Started in R breast. Underwent neo-adjuvant chemo. During this time developed L breast CA. Underwent bilateral mastectomy. Lymph nodes +. 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   She received a total of 6 cycles of Taxotere/Carbo/Herceptin, completed in April 2005, after which she began single agent Herceptin, given every 3 weeks. Plan to continue on Herceptin indefinitely.   She has been stable symptomatically.  Takes Lasix about once a week.  No exertional dyspnea or chest pain.  Weight is down 17 lbs.    Labs 11/15: K 4, creatinine 1.0   Labs 1/16: K 4.1, creatinine 1.0 Labs 8/16: K 4.3, creatinine 0.9 Labs 11/16: K 3.9, creatinine 0.9 Labs 5/17: K 3.9, creatinine 0.68  04/23/12 ECHO EF 60-65% Lateral s' 8.9 cm/s  07/30/12 ECHO EF 60-65% Lateral s' 8.3 cm/s  09/11/12 ECHO EF 60-65% Lateral s' 10.3 cm/s  12/22/12 ECHO 55-60% Lateral S' 9.8 RV mildly dilated  07/01/13 ECHO 55-60%, lateral s' 9.79, mild RV dilation, grade II DD 3/15 ECHO 55%, mild MR, lateral s' 9.6, GLS -19.2% 03/02/14 ECHO EF 55-60% Lateral S' 9.4 GLS - 21.9  11/15 ECHO EF 60-65%, lateral S' 6.8, GLS -17.2%, mild RV dilation with normal systolic function, mild MR.  2/16 ECHO EF 60-65%, lateral S' 14.4, GLS -92.9%, normal diastolic function, mild RV dilation with normal RV systolic function.  8/16 ECHO EF 60-65%, mild LVH, normal RV size and systolic function, lateral s' 13.2, GLS -17%.  11/16 ECHO EF 60-65% Lateral S' 10.2, GLS -20.2% 5/17 ECHO EF 60-65%, mild LVH, lateral s' 12.2, GLS -20.8%, grade II diastolic dysfunction, normal RV.   ROS: All systems negative except as listed in HPI, PMH and Problem  List.  Past Medical History  Diagnosis Date  . Breast cancer ([place])     mets to liver and lung  . Hypertension   . SVC syndrome   . History of chemotherapy Feb. 2006    taxotere/herceptin/carboplatin  . Radiation 07/31/2006    left upper chest  . Radiation 06/17/2006-06/27/2006    6480 cGy bilat. chest wall  . Neuropathy ([place])   . Thrombosis   . Breast cancer metastasized to multiple sites [facility]) 02/26/2013    Current Outpatient Prescriptions  Medication Sig Dispense Refill  . acetaminophen (TYLENOL) 500 MG tablet Take 1,000 mg by mouth every 6 (six) hours as needed for mild pain or fever.     Marland Kitchen albuterol (PROVENTIL HFA;VENTOLIN HFA) 108 (90 BASE) MCG/ACT inhaler Inhale 2 puffs into the lungs every 6 (six) hours as needed for wheezing. 1 Inhaler 5  . ALPRAZolam (XANAX) 1 MG tablet Take 1 tablet (1 mg total) by mouth 3 (three) times daily as needed. for anxiety 60 tablet 0  . amLODipine (NORVASC) 10 MG tablet Take 10 mg by mouth every morning.    . baclofen (LIORESAL) 10 MG tablet Take 1 tablet (10 mg total) by mouth 3 (three) times daily as needed. for muscle spams 270 tablet 3  . carvedilol (COREG) 3.125 MG tablet TAKE 1 TABLET BY MOUTH TWICE DAILY 180 tablet 3  . diclofenac sodium (VOLTAREN) 1 % GEL Apply 2 g topically daily as needed (for pain). Apply to knees and shoulders 100 g  6  . furosemide (LASIX) 80 MG tablet Take 80 mg by mouth daily as needed for fluid.     Marland Kitchen gabapentin (NEURONTIN) 300 MG capsule TAKE 2 CAPSULES(600 MG) BY MOUTH THREE TIMES DAILY 540 capsule 0  . losartan (COZAAR) 100 MG tablet Take 100 mg by mouth every morning.    . potassium chloride (K-DUR,KLOR-CON) 10 MEQ tablet Take 4 tablets (40 mEq total) by mouth daily as needed. 120 tablet 5  . spironolactone (ALDACTONE) 25 MG tablet Take 1 tablet (25 mg total) by mouth daily. 30 tablet 3  . temazepam (RESTORIL) 30 MG capsule TAKE 1 CAPSULE BY MOUTH EVERY DAY AT BEDTIME AS NEEDED FOR SLEEP 30 capsule 1  . venlafaxine  XR (EFFEXOR-XR) 37.5 MG 24 hr capsule Take 1 capsule (37.5 mg total) by mouth daily with breakfast. 30 capsule 5  . warfarin (COUMADIN) 5 MG tablet TAKE 1 TABLET BY MOUTH EVERY DAY AS DIRECTED 90 tablet 0   No current facility-administered medications for this encounter.   Facility-Administered Medications Ordered in Other Encounters  Medication Dose Route Frequency Provider Last Rate Last Dose  . sodium chloride flush (NS) 0.9 % injection 10 mL  10 mL Intravenous PRN Chauncey Cruel, MD   10 mL at 12/15/15 1200    Filed Vitals:   01/10/16 1143  BP: 112/72  Pulse: 75  Resp: 18  Weight: 289 lb (131.09 kg)  SpO2: 95%    PHYSICAL EXAM: General: NAD, obese. HEENT: normal  Neck: Thick. No JVD difficult to assess d/t body habitus but does not appear elevated; Carotids 2+ bilat; no bruits. No lymphadenopathy or thryomegaly appreciated.  Cor: PMI nondisplaced. Regular rate & rhythm. No rubs, gallops or murmurs. S/P bilateral mastectomies  Lungs: CTA, normal effort Abdomen: obese. soft, NT, ND. +BS  Extremities: no cyanosis, clubbing, rash.  Trace edema.  Neuro: alert & oriented x 3, cranial nerves grossly intact. moves all 4 extremities w/o difficulty. Affect pleasant.  ASSESSMENT & PLAN: 1) L Breast Cancer s/p bilateral mastectomies:  Symptomatically stable.  Echo was reviewed today and appears stable.  She will be continuing Herceptin indefinitely.   - Repeat echo and office followup in 4 months.  2) Suspected OSA: She has not wanted to do a sleep study.    3) HTN: BP is controlled on current regimen.  [name] 01/12/2016

## 2016-01-16 ENCOUNTER — Other Ambulatory Visit: Payer: Self-pay | Admitting: Oncology

## 2016-01-17 ENCOUNTER — Ambulatory Visit (HOSPITAL_BASED_OUTPATIENT_CLINIC_OR_DEPARTMENT_OTHER): Payer: Commercial Managed Care - HMO | Admitting: Oncology

## 2016-01-17 ENCOUNTER — Other Ambulatory Visit (HOSPITAL_BASED_OUTPATIENT_CLINIC_OR_DEPARTMENT_OTHER): Payer: Commercial Managed Care - HMO

## 2016-01-17 ENCOUNTER — Other Ambulatory Visit: Payer: Self-pay | Admitting: Nurse Practitioner

## 2016-01-17 ENCOUNTER — Telehealth: Payer: Self-pay | Admitting: Oncology

## 2016-01-17 VITALS — BP 149/87 | HR 64 | Temp 98.8°F | Resp 19 | Ht 63.0 in | Wt 293.4 lb

## 2016-01-17 DIAGNOSIS — C50919 Malignant neoplasm of unspecified site of unspecified female breast: Secondary | ICD-10-CM

## 2016-01-17 DIAGNOSIS — Z6841 Body Mass Index (BMI) 40.0 and over, adult: Secondary | ICD-10-CM

## 2016-01-17 DIAGNOSIS — R791 Abnormal coagulation profile: Secondary | ICD-10-CM | POA: Diagnosis not present

## 2016-01-17 DIAGNOSIS — C50912 Malignant neoplasm of unspecified site of left female breast: Secondary | ICD-10-CM

## 2016-01-17 DIAGNOSIS — C50812 Malignant neoplasm of overlapping sites of left female breast: Secondary | ICD-10-CM | POA: Diagnosis not present

## 2016-01-17 DIAGNOSIS — D689 Coagulation defect, unspecified: Secondary | ICD-10-CM

## 2016-01-17 DIAGNOSIS — C50911 Malignant neoplasm of unspecified site of right female breast: Secondary | ICD-10-CM | POA: Diagnosis not present

## 2016-01-17 DIAGNOSIS — Z7901 Long term (current) use of anticoagulants: Secondary | ICD-10-CM

## 2016-01-17 LAB — PROTIME-INR
INR: 3.3 (ref 2.00–3.50)
PROTIME: 39.6 s — AB (ref 10.6–13.4)

## 2016-01-17 NOTE — Telephone Encounter (Signed)
appt made and avs printed °

## 2016-01-17 NOTE — Progress Notes (Signed)
[facility]  [phone] [phone]  OFFICE PROGRESS NOTE  ID: [name]   DOB: [birth year]  MR#: [id number]  JYN#:[id number]  PCP: Yolanda Kiel, MD GYN:  SU:  OTHER MD: [name], [name], [name]  CC: Metastatic HER-2 positive, estrogen receptor negative thank you thank you breast cancer.  CURRENT TREATMENT: Trastuzumab  BREAST CANCER HISTORY:   From the earlier summary:  [name] is 65 years old [name] female.  This woman has been in good health all of her life.  She noted a swelling and discomfort in her right breast in June 2004.  She was seen in the Emergency Room in [place] and was treated for mastitis.  She was treated for a number of months with mastitis and the swelling did not get better. She was given hydrocodone and Cipro.  Finally, the swelling did get better and ultimately the nipple became retracted and she noticed some dimpling in her skin. She had a mammogram in July of 2004 in [place] with subsequent mammogram on May 27, 2003, by Dr. Isaiah Davis.  Mammogram done on September 30 showed marked increased density in the left breast.  Biopsy was performed the same day.  It was noted at the 12 o'clock position, deep in the breast was a focal hypoechoic mass, at least 3.5 cm in diameter.  Biopsy did in fact show invasive in situ mammary carcinoma. This was felt to be both at least intermediate, high grade.  No definite lymphovascular invasion was identified. ER and PR negative, Her2 testing positive. [name] continues to have pain in her breast.  She continues to take hydrocodone a number of times a day.  She has been seen by Dr. Rosana Davis, who felt that neoadjuvant chemotherapy would be required.    Initial staging studies showed evidence of liver and lung mets.   Patient also has evidence of bone lesions. Patient started neoadjuvant chemotherapy, Taxotere/Carbo/Herceptin in October 2004.    Patient had a CT scan in December 2004 which demonstrated extensive clot in the SVC innominate vein, bilateral jugular vein and  She was started on anticoagulation therapy. She received a total of 6 cycles of Taxotere/Carbo/Herceptin, completed in April 2005."  Patient has been on  trastuzumab continued indefinitely; has also received lapatinib and capecitabine for variable intervals in 2007-2008. Most recent echo 12/01/2013 showed an ejection fraction of 55%. She is status post bilateral mastectomies with bilateral axillary lymph node dissection 12/07/2004, showing (a) on the right, a mypT1c ypN1 invasive ductal carcinoma, grade 3, estrogen and progesterone receptor negative, HER-2 positive, with an MIB-1 of 31% (b) on the left, ypT2 ypN1 invasive ductal carcinoma, grade 2, estrogen and progesterone receptor negative, HER-2 positive, with an MIB-1 of 35%. She is Status post radiation June through July of 2006, to the right chest wall, left chest wall, bilateral supraclavicular fossae, and bilateral axillary boosts; with additional radiation to the right and left chest walls and the central chest wall completed November of 2007. She is status post ixempra x9 completed August of 2009. 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 Patient has chronic pain, with negative PET scan 08/24/2013 (no evidence of active cancer). On Neurontin and Tramadol therapy.  Her subsequent history is as detailed below  INTERVAL HISTORY: [name] returns today for followup of her stage IV breast cancer accompanied by her husband, [name]. We're doing trastuzumab every 3 weeks indefinitely. She tolerates it  well. She had an echocardiogram elderly of this month which shows an unchanged ejection fraction  REVIEW OF SYSTEMS: [name] is having significant problems with both knees. She is using the sleeves, a brace on the right, and over-the-counter  analgesics, but still has significant pain from it. She tells me she saw Dr. Noemi Davis remotely but has not seen a knee specialist in a while. Aside from that issue she complains of insomnia, which is long-standing , rare palpitations, occasional shortness of breath when walking, and of course anxiety, but no depression. A detailed review of systems today was otherwise stable  PAST MEDICAL HISTORY: Past Medical History  Diagnosis Date  . Breast cancer ([name])     mets to liver and lung  . Hypertension   . SVC syndrome   . History of chemotherapy Feb. 2006    taxotere/herceptin/carboplatin  . Radiation 07/31/2006    left upper chest  . Radiation 06/17/2006-06/27/2006    6480 cGy bilat. chest wall  . Neuropathy ([place])   . Thrombosis   . Breast cancer metastasized to multiple sites [name]) 02/26/2013    PAST SURGICAL HISTORY: Past Surgical History  Procedure Laterality Date  . Tubal ligation  1986  . Cholecystectomy  1989  . Mastectomy Bilateral   . Ankle surgery    . Peripherally inserted central catheter insertion    . Back surgery      FAMILY HISTORY Family History  Problem Relation Age of Onset  . Heart failure Father   . Cancer Father     Prostate cancer  . Heart failure Brother   . Cancer Brother     Prostate cancer  . Diabetes Maternal Aunt    She had three brothers, one died of gunshot wound, one of complications of diabetes mellitus and one of myocardial infarction.  She has no sisters.  Mother died of complications of brain metastasis in 25.  Father has had a myocardial infarction in 1999.  No history of breast or ovarian cancer in the family.     GYNECOLOGIC HISTORY:   Menarche at age 30.  Gravida 3, para 3.  First live birth at age 54.  No history of breast feeding. No history of hormonal replacement therapy.   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 Her husband used to work as a Art gallery manager, but is  now retired. She has three children, [name] who lives in [place] and works as a Hydrographic surveyor, Financial risk analyst who lives in [place] and works as a Administrator, and [place] who lives in [place] and also works as a Hydrographic surveyor. The patient has 12 grandchildren and 4 great-grandchildren. She attends a [employer]. Very involved with school kids.  ADVANCED DIRECTIVES:  Not in place  HEALTH MAINTENANCE: (Updated 06/12/2013) Social History  Substance Use Topics  . Smoking status: Never Smoker   . Smokeless tobacco: Never Used  . Alcohol Use: Yes     Comment: occasional     Colonoscopy: Never and "I don't want one"  PAP:  1987  Bone density:  Never  Lipid panel:  Not on file    Allergies  Allergen Reactions  . Penicillins Hives    PATIENT HAS TOLERATED CEPHALOSPORINGS  . Adhesive [Tape] Other (See Comments)    Tears skin     Current Outpatient Prescriptions  Medication Sig Dispense Refill  . acetaminophen (TYLENOL) 500 MG tablet Take 1,000 mg by mouth every  6 (six) hours as needed for mild pain or fever.     Marland Kitchen albuterol (PROVENTIL HFA;VENTOLIN HFA) 108 (90 BASE) MCG/ACT inhaler Inhale 2 puffs into the lungs every 6 (six) hours as needed for wheezing. 1 Inhaler 5  . ALPRAZolam (XANAX) 1 MG tablet TAKE 1 TABLET BY MOUTH THREE TIMES DAILY AS NEEDED FOR ANXIETY 60 tablet 0  . amLODipine (NORVASC) 10 MG tablet Take 10 mg by mouth every morning.    . baclofen (LIORESAL) 10 MG tablet Take 1 tablet (10 mg total) by mouth 3 (three) times daily as needed. for muscle spams 270 tablet 3  . carvedilol (COREG) 3.125 MG tablet TAKE 1 TABLET BY MOUTH TWICE DAILY 180 tablet 3  . diclofenac sodium (VOLTAREN) 1 % GEL Apply 2 g topically daily as needed (for pain). Apply to knees and shoulders 100 g 6  . furosemide (LASIX) 80 MG tablet Take 80 mg by mouth daily as needed for fluid.     Marland Kitchen gabapentin (NEURONTIN) 300 MG capsule TAKE 2 CAPSULES(600 MG) BY MOUTH THREE TIMES DAILY 540 capsule 0  .  losartan (COZAAR) 100 MG tablet Take 100 mg by mouth every morning.    . potassium chloride (K-DUR,KLOR-CON) 10 MEQ tablet Take 4 tablets (40 mEq total) by mouth daily as needed. 120 tablet 5  . spironolactone (ALDACTONE) 25 MG tablet Take 1 tablet (25 mg total) by mouth daily. 30 tablet 3  . temazepam (RESTORIL) 30 MG capsule TAKE 1 CAPSULE BY MOUTH EVERY DAY AT BEDTIME AS NEEDED FOR SLEEP 30 capsule 1  . venlafaxine XR (EFFEXOR-XR) 37.5 MG 24 hr capsule Take 1 capsule (37.5 mg total) by mouth daily with breakfast. 30 capsule 5  . warfarin (COUMADIN) 5 MG tablet TAKE 1 TABLET BY MOUTH EVERY DAY AS DIRECTED 90 tablet 0   No current facility-administered medications for this visit.   Facility-Administered Medications Ordered in Other Visits  Medication Dose Route Frequency Provider Last Rate Last Dose  . sodium chloride flush (NS) 0.9 % injection 10 mL  10 mL Intravenous PRN Chauncey Cruel, MD   10 mL at 12/15/15 1200    OBJECTIVE:  Middle aged African American woman In no acute distress  Filed Vitals:   01/17/16 1259  BP: 149/87  Pulse: 64  Temp: 98.8 F (37.1 C)  Resp: 19     Body mass index is 51.99 kg/(m^2).    ECOG FS: 1 Filed Weights   01/17/16 1259  Weight: 293 lb 6.4 oz (133.085 kg)   Filed Weights   01/17/16 1259  Weight: 293 lb 6.4 oz (133.085 kg)   Sclerae unicteric, EOMs intact Oropharynx clear, dentition in good repair No cervical or supraclavicular adenopathy Lungs no rales or rhonchi Heart regular rate and rhythm Abd soft, nontender, positive bowel sounds MSK no focal spinal tenderness, no upper extremity lymphedema Neuro: nonfocal, well oriented, appropriate affect Breasts: Status post bilateral mastectomies, with no evidence of local recurrence; both axillae are benign.  LAB RESULTS: Lab Results  Component Value Date   WBC 9.2 01/05/2016   NEUTROABS 5.6 01/05/2016   HGB 12.7 01/05/2016   HCT 37.6 01/05/2016   MCV 79.8 01/05/2016   PLT 289 01/05/2016       Chemistry      Component Value Date/Time   NA 142 01/05/2016 0853   NA 139 12/09/2014 0940   K 3.9 01/05/2016 0853   K 3.9 12/09/2014 0940   CL 107 12/09/2014 0940   CL 104 01/30/2013 0850  CO2 27 01/05/2016 0853   CO2 25 12/09/2014 0940   BUN 15.4 01/05/2016 0853   BUN 16 12/09/2014 0940   CREATININE 1.0 01/05/2016 0853   CREATININE 0.75 12/09/2014 0940      Component Value Date/Time   CALCIUM 9.7 01/05/2016 0853   CALCIUM 8.8 12/09/2014 0940   ALKPHOS 88 01/05/2016 0853   ALKPHOS 94 12/09/2014 0940   AST 11 01/05/2016 0853   AST 20 12/09/2014 0940   ALT 11 01/05/2016 0853   ALT 20 12/09/2014 0940   BILITOT 0.68 01/05/2016 0853   BILITOT 0.4 12/09/2014 0940     STUDIES: No results found. Dr. Jana Hakim this yes he will level no injection and uvula ASSESSMENT: 65 y.o.  [place], [place], woman  (1)  with a history of inflammatory right breast cancer metastatic at presentation September 2004 with involvement of liver and bone, HER-2 positive, estrogen and progesterone receptor negative  (2) treated with carboplatin, docetaxel and Herceptin x 6 completed April 2005  (3) trastuzumab continued indefinitely;   (a) has also received lapatinib and capecitabine for variable intervals in 2007-2008.  (b) Most recent echo 01/10/2016 showed an ejection fraction of 60-65%  To be performed every 6 months from this point.  (4) status post bilateral mastectomies with bilateral axillary lymph node dissection 12/07/2004, showing  (a) on the right, a mypT1c ypN1 invasive ductal carcinoma, grade 3, estrogen and progesterone receptor negative, HER-2 positive, with an MIB-1 of 31%  (b) on the left, ypT2 ypN1 invasive ductal carcinoma, grade 2, estrogen and progesterone receptor negative, HER-2 positive, with an MIB-1 of 35%.  (5)  Status post radiation June through July of 2006, to the right chest wall, left chest wall, bilateral supraclavicular fossae, and bilateral axillary  boosts; with additional radiation to the right and left chest walls and the central chest wall completed November of 2007  (6) status post Ixempra x9 completed August of 2009.  (7) history of superior vena caval syndrome, on life long anticoagulation   (8)  History of chemotherapy-induced neuropathy.   (9)  chronic pain, with negative PET scan 08/24/2013 (no evidence of active cancer). On Neurontin and Tramadol  (a) repeat PET scan December 2016 again negative.  (10) right upper extremity cellulitis, no bacteremia; treated with cephalexin /doxycycline for 2 weeks, with resolution   PLAN: [place] is now 11 years out from definitive surgery for her bilateral breast cancers, metastatic at presentation, but with no evidence of active disease. This is very favorable.  The plan is to continue trastuzumab indefinitely or until there are toxicities or disease progression. Her echocardiograms have been stretched out to every 4 months and likely will be stretched out to every 6 months after the next 1, which is due in September.  Her INR is slightly elevated today. She is eating fewer greetings because she did not make a gardening this year. I suggested she go down to 2.5 mg today and then resume the 5 mg. We will repeat her INR next week when she returns for trastuzumab.  Otherwise she continues to have significant knee problems. I don't know that this can be definitively corrected unless she loses some weight. In any case she is addressing this through her primary care and has been referred she tells me to an orthopedist in [place]  She will see me again in October and shortly before that visit she will have a restaging CT scan of the chest  She knows to call for any problems that may  develop before that visit.  Chauncey Cruel, MD  01/17/2016 1:06 PM

## 2016-01-26 ENCOUNTER — Ambulatory Visit (HOSPITAL_BASED_OUTPATIENT_CLINIC_OR_DEPARTMENT_OTHER): Payer: Commercial Managed Care - HMO

## 2016-01-26 ENCOUNTER — Other Ambulatory Visit (HOSPITAL_BASED_OUTPATIENT_CLINIC_OR_DEPARTMENT_OTHER): Payer: Commercial Managed Care - HMO

## 2016-01-26 ENCOUNTER — Ambulatory Visit: Payer: Commercial Managed Care - HMO

## 2016-01-26 DIAGNOSIS — C50812 Malignant neoplasm of overlapping sites of left female breast: Secondary | ICD-10-CM

## 2016-01-26 DIAGNOSIS — C50911 Malignant neoplasm of unspecified site of right female breast: Secondary | ICD-10-CM | POA: Diagnosis not present

## 2016-01-26 DIAGNOSIS — I871 Compression of vein: Secondary | ICD-10-CM | POA: Diagnosis not present

## 2016-01-26 DIAGNOSIS — C50919 Malignant neoplasm of unspecified site of unspecified female breast: Secondary | ICD-10-CM

## 2016-01-26 DIAGNOSIS — Z5112 Encounter for antineoplastic immunotherapy: Secondary | ICD-10-CM

## 2016-01-26 DIAGNOSIS — Z95828 Presence of other vascular implants and grafts: Secondary | ICD-10-CM

## 2016-01-26 LAB — CBC WITH DIFFERENTIAL/PLATELET
BASO%: 1.4 % (ref 0.0–2.0)
Basophils Absolute: 0.1 10*3/uL (ref 0.0–0.1)
EOS%: 2.6 % (ref 0.0–7.0)
Eosinophils Absolute: 0.2 10*3/uL (ref 0.0–0.5)
HCT: 35.8 % (ref 34.8–46.6)
HGB: 11.9 g/dL (ref 11.6–15.9)
LYMPH%: 37.6 % (ref 14.0–49.7)
MCH: 26.9 pg (ref 25.1–34.0)
MCHC: 33.4 g/dL (ref 31.5–36.0)
MCV: 80.4 fL (ref 79.5–101.0)
MONO#: 0.6 10*3/uL (ref 0.1–0.9)
MONO%: 6.7 % (ref 0.0–14.0)
NEUT%: 51.7 % (ref 38.4–76.8)
NEUTROS ABS: 4.3 10*3/uL (ref 1.5–6.5)
PLATELETS: 237 10*3/uL (ref 145–400)
RBC: 4.45 10*6/uL (ref 3.70–5.45)
RDW: 15.5 % — AB (ref 11.2–14.5)
WBC: 8.4 10*3/uL (ref 3.9–10.3)
lymph#: 3.2 10*3/uL (ref 0.9–3.3)

## 2016-01-26 LAB — COMPREHENSIVE METABOLIC PANEL
AST: 11 U/L (ref 5–34)
Albumin: 3.6 g/dL (ref 3.5–5.0)
Alkaline Phosphatase: 79 U/L (ref 40–150)
Anion Gap: 8 mEq/L (ref 3–11)
BILIRUBIN TOTAL: 0.69 mg/dL (ref 0.20–1.20)
BUN: 16.5 mg/dL (ref 7.0–26.0)
CO2: 26 meq/L (ref 22–29)
CREATININE: 0.9 mg/dL (ref 0.6–1.1)
Calcium: 9.2 mg/dL (ref 8.4–10.4)
Chloride: 109 mEq/L (ref 98–109)
EGFR: 80 mL/min/{1.73_m2} — ABNORMAL LOW (ref >90)
GLUCOSE: 92 mg/dL (ref 70–140)
Potassium: 3.8 mEq/L (ref 3.5–5.1)
SODIUM: 143 meq/L (ref 136–145)
TOTAL PROTEIN: 7.9 g/dL (ref 6.4–8.3)

## 2016-01-26 LAB — PROTIME-INR
INR: 2.8 (ref 2.00–3.50)
Protime: 33.6 Seconds — ABNORMAL HIGH (ref 10.6–13.4)

## 2016-01-26 MED ORDER — LORAZEPAM 2 MG/ML IJ SOLN
INTRAMUSCULAR | Status: AC
  Filled 2016-01-26: qty 1

## 2016-01-26 MED ORDER — SODIUM CHLORIDE 0.9 % IJ SOLN
10.0000 mL | INTRAMUSCULAR | Status: DC | PRN
  Administered 2016-01-26: 10 mL via INTRAVENOUS
  Filled 2016-01-26: qty 10

## 2016-01-26 MED ORDER — ACETAMINOPHEN 325 MG PO TABS
650.0000 mg | ORAL_TABLET | Freq: Once | ORAL | Status: AC
  Administered 2016-01-26: 650 mg via ORAL

## 2016-01-26 MED ORDER — SODIUM CHLORIDE 0.9 % IJ SOLN
10.0000 mL | INTRAMUSCULAR | Status: DC | PRN
  Administered 2016-01-26: 10 mL
  Filled 2016-01-26: qty 10

## 2016-01-26 MED ORDER — DIPHENHYDRAMINE HCL 25 MG PO CAPS
25.0000 mg | ORAL_CAPSULE | Freq: Once | ORAL | Status: AC
  Administered 2016-01-26: 25 mg via ORAL

## 2016-01-26 MED ORDER — LORAZEPAM 2 MG/ML IJ SOLN
1.0000 mg | Freq: Once | INTRAMUSCULAR | Status: AC | PRN
  Administered 2016-01-26: 1 mg via INTRAVENOUS

## 2016-01-26 MED ORDER — ACETAMINOPHEN 325 MG PO TABS
ORAL_TABLET | ORAL | Status: AC
  Filled 2016-01-26: qty 2

## 2016-01-26 MED ORDER — DIPHENHYDRAMINE HCL 25 MG PO CAPS
ORAL_CAPSULE | ORAL | Status: AC
  Filled 2016-01-26: qty 1

## 2016-01-26 MED ORDER — HEPARIN SOD (PORK) LOCK FLUSH 100 UNIT/ML IV SOLN
500.0000 [IU] | Freq: Once | INTRAVENOUS | Status: AC | PRN
  Administered 2016-01-26: 500 [IU]
  Filled 2016-01-26: qty 5

## 2016-01-26 MED ORDER — TRASTUZUMAB CHEMO INJECTION 440 MG
6.0000 mg/kg | Freq: Once | INTRAVENOUS | Status: AC
  Administered 2016-01-26: 798 mg via INTRAVENOUS
  Filled 2016-01-26: qty 38

## 2016-01-26 MED ORDER — SODIUM CHLORIDE 0.9 % IV SOLN: Freq: Once | INTRAVENOUS | Status: DC

## 2016-01-26 NOTE — Telephone Encounter (Signed)
No entry 

## 2016-01-26 NOTE — Patient Instructions (Signed)

## 2016-02-15 ENCOUNTER — Other Ambulatory Visit: Payer: Self-pay | Admitting: Oncology

## 2016-02-15 ENCOUNTER — Ambulatory Visit (HOSPITAL_BASED_OUTPATIENT_CLINIC_OR_DEPARTMENT_OTHER): Payer: Commercial Managed Care - HMO

## 2016-02-15 ENCOUNTER — Other Ambulatory Visit (HOSPITAL_BASED_OUTPATIENT_CLINIC_OR_DEPARTMENT_OTHER): Payer: Commercial Managed Care - HMO

## 2016-02-15 VITALS — BP 134/72 | HR 66 | Temp 98.6°F | Resp 18

## 2016-02-15 DIAGNOSIS — C50919 Malignant neoplasm of unspecified site of unspecified female breast: Secondary | ICD-10-CM | POA: Diagnosis not present

## 2016-02-15 DIAGNOSIS — Z7901 Long term (current) use of anticoagulants: Secondary | ICD-10-CM | POA: Diagnosis not present

## 2016-02-15 DIAGNOSIS — C50911 Malignant neoplasm of unspecified site of right female breast: Secondary | ICD-10-CM

## 2016-02-15 DIAGNOSIS — Z5112 Encounter for antineoplastic immunotherapy: Secondary | ICD-10-CM | POA: Diagnosis not present

## 2016-02-15 DIAGNOSIS — C50812 Malignant neoplasm of overlapping sites of left female breast: Secondary | ICD-10-CM

## 2016-02-15 LAB — CBC WITH DIFFERENTIAL/PLATELET
BASO%: 0.7 % (ref 0.0–2.0)
BASOS ABS: 0.1 10*3/uL (ref 0.0–0.1)
EOS ABS: 0.2 10*3/uL (ref 0.0–0.5)
EOS%: 1.9 % (ref 0.0–7.0)
HCT: 35.4 % (ref 34.8–46.6)
HGB: 11.9 g/dL (ref 11.6–15.9)
LYMPH%: 27.1 % (ref 14.0–49.7)
MCH: 27.1 pg (ref 25.1–34.0)
MCHC: 33.6 g/dL (ref 31.5–36.0)
MCV: 80.5 fL (ref 79.5–101.0)
MONO#: 0.8 10*3/uL (ref 0.1–0.9)
MONO%: 7.3 % (ref 0.0–14.0)
NEUT#: 6.6 10*3/uL — ABNORMAL HIGH (ref 1.5–6.5)
NEUT%: 63 % (ref 38.4–76.8)
Platelets: 244 10*3/uL (ref 145–400)
RBC: 4.39 10*6/uL (ref 3.70–5.45)
RDW: 15.8 % — ABNORMAL HIGH (ref 11.2–14.5)
WBC: 10.5 10*3/uL — ABNORMAL HIGH (ref 3.9–10.3)
lymph#: 2.8 10*3/uL (ref 0.9–3.3)

## 2016-02-15 LAB — COMPREHENSIVE METABOLIC PANEL
ALBUMIN: 3.5 g/dL (ref 3.5–5.0)
ALK PHOS: 82 U/L (ref 40–150)
ALT: 12 U/L (ref 0–55)
AST: 12 U/L (ref 5–34)
Anion Gap: 8 mEq/L (ref 3–11)
BUN: 22.3 mg/dL (ref 7.0–26.0)
CALCIUM: 9.5 mg/dL (ref 8.4–10.4)
CHLORIDE: 108 meq/L (ref 98–109)
CO2: 26 mEq/L (ref 22–29)
Creatinine: 1 mg/dL (ref 0.6–1.1)
EGFR: 69 mL/min/{1.73_m2} — AB (ref >90)
GLUCOSE: 107 mg/dL (ref 70–140)
POTASSIUM: 4.2 meq/L (ref 3.5–5.1)
SODIUM: 141 meq/L (ref 136–145)
Total Bilirubin: 0.51 mg/dL (ref 0.20–1.20)
Total Protein: 8.1 g/dL (ref 6.4–8.3)

## 2016-02-15 LAB — PROTIME-INR
INR: 2 (ref 2.00–3.50)
PROTIME: 24 s — AB (ref 10.6–13.4)

## 2016-02-15 MED ORDER — SODIUM CHLORIDE 0.9 % IV SOLN
Freq: Once | INTRAVENOUS | Status: AC
  Administered 2016-02-15: 11:00:00 via INTRAVENOUS

## 2016-02-15 MED ORDER — ACETAMINOPHEN 325 MG PO TABS
650.0000 mg | ORAL_TABLET | Freq: Once | ORAL | Status: AC
  Administered 2016-02-15: 650 mg via ORAL

## 2016-02-15 MED ORDER — TRASTUZUMAB CHEMO INJECTION 440 MG
6.0000 mg/kg | Freq: Once | INTRAVENOUS | Status: AC
  Administered 2016-02-15: 798 mg via INTRAVENOUS
  Filled 2016-02-15: qty 38

## 2016-02-15 MED ORDER — DIPHENHYDRAMINE HCL 25 MG PO CAPS
25.0000 mg | ORAL_CAPSULE | Freq: Once | ORAL | Status: AC
  Administered 2016-02-15: 25 mg via ORAL

## 2016-02-15 MED ORDER — ACETAMINOPHEN 325 MG PO TABS
ORAL_TABLET | ORAL | Status: AC
  Filled 2016-02-15: qty 2

## 2016-02-15 MED ORDER — SODIUM CHLORIDE 0.9 % IJ SOLN
10.0000 mL | INTRAMUSCULAR | Status: DC | PRN
  Administered 2016-02-15: 10 mL
  Filled 2016-02-15: qty 10

## 2016-02-15 MED ORDER — LORAZEPAM 2 MG/ML IJ SOLN
INTRAMUSCULAR | Status: AC
  Filled 2016-02-15: qty 1

## 2016-02-15 MED ORDER — LORAZEPAM 2 MG/ML IJ SOLN
1.0000 mg | Freq: Once | INTRAMUSCULAR | Status: AC | PRN
  Administered 2016-02-15: 1 mg via INTRAVENOUS

## 2016-02-15 MED ORDER — HEPARIN SOD (PORK) LOCK FLUSH 100 UNIT/ML IV SOLN
500.0000 [IU] | Freq: Once | INTRAVENOUS | Status: AC | PRN
  Administered 2016-02-15: 500 [IU]
  Filled 2016-02-15: qty 5

## 2016-02-15 MED ORDER — DIPHENHYDRAMINE HCL 25 MG PO CAPS
ORAL_CAPSULE | ORAL | Status: AC
  Filled 2016-02-15: qty 1

## 2016-02-15 NOTE — Patient Instructions (Signed)
[facility] Center Discharge Instructions for Patients Receiving Chemotherapy  Today you received the following chemotherapy agents: Herceptin   To help prevent nausea and vomiting after your treatment, we encourage you to take your nausea medication as directed.    If you develop nausea and vomiting that is not controlled by your nausea medication, call the clinic.   BELOW ARE SYMPTOMS THAT SHOULD BE REPORTED IMMEDIATELY:  *FEVER GREATER THAN 100.5 F  *CHILLS WITH OR WITHOUT FEVER  NAUSEA AND VOMITING THAT IS NOT CONTROLLED WITH YOUR NAUSEA MEDICATION  *UNUSUAL SHORTNESS OF BREATH  *UNUSUAL BRUISING OR BLEEDING  TENDERNESS IN MOUTH AND THROAT WITH OR WITHOUT PRESENCE OF ULCERS  *URINARY PROBLEMS  *BOWEL PROBLEMS  UNUSUAL RASH Items with * indicate a potential emergency and should be followed up as soon as possible.  Feel free to call the clinic you have any questions or concerns. The clinic phone number is [phone].  Please show the CHEMO ALERT CARD at check-in to the Emergency Department and triage nurse.   

## 2016-02-16 ENCOUNTER — Telehealth: Payer: Self-pay | Admitting: *Deleted

## 2016-02-16 NOTE — Telephone Encounter (Signed)
INR 2.0  Current coumadin dose 5 mg daily   Next lab scheduled 03/07/2016.  No change in dose per MD - called to pt.

## 2016-02-22 ENCOUNTER — Telehealth: Payer: Self-pay | Admitting: *Deleted

## 2016-02-22 NOTE — Telephone Encounter (Signed)
"  I'm calling in reference to refills I requested from [facility] three days ago.  They have not heard from Dr. Jana Hakim or Boelter's office yet."

## 2016-02-23 ENCOUNTER — Other Ambulatory Visit: Payer: Self-pay | Admitting: *Deleted

## 2016-02-23 DIAGNOSIS — C50919 Malignant neoplasm of unspecified site of unspecified female breast: Secondary | ICD-10-CM

## 2016-02-23 MED ORDER — TEMAZEPAM 30 MG PO CAPS: ORAL_CAPSULE | ORAL | Status: DC

## 2016-02-23 MED ORDER — ALPRAZOLAM 1 MG PO TABS: 1.0000 mg | ORAL_TABLET | Freq: Three times a day (TID) | ORAL | Status: DC | PRN

## 2016-03-01 NOTE — Telephone Encounter (Signed)
Restoril; Alprazolam refilled on 02-23-2016.

## 2016-03-07 ENCOUNTER — Other Ambulatory Visit (HOSPITAL_BASED_OUTPATIENT_CLINIC_OR_DEPARTMENT_OTHER): Payer: Commercial Managed Care - HMO

## 2016-03-07 ENCOUNTER — Ambulatory Visit (HOSPITAL_BASED_OUTPATIENT_CLINIC_OR_DEPARTMENT_OTHER): Payer: Commercial Managed Care - HMO

## 2016-03-07 VITALS — BP 139/85 | HR 54 | Temp 98.1°F | Resp 18

## 2016-03-07 DIAGNOSIS — C50812 Malignant neoplasm of overlapping sites of left female breast: Secondary | ICD-10-CM

## 2016-03-07 DIAGNOSIS — Z5112 Encounter for antineoplastic immunotherapy: Secondary | ICD-10-CM

## 2016-03-07 DIAGNOSIS — R791 Abnormal coagulation profile: Secondary | ICD-10-CM | POA: Diagnosis not present

## 2016-03-07 DIAGNOSIS — Z7901 Long term (current) use of anticoagulants: Secondary | ICD-10-CM | POA: Diagnosis not present

## 2016-03-07 DIAGNOSIS — C50911 Malignant neoplasm of unspecified site of right female breast: Secondary | ICD-10-CM

## 2016-03-07 DIAGNOSIS — C50919 Malignant neoplasm of unspecified site of unspecified female breast: Secondary | ICD-10-CM

## 2016-03-07 LAB — COMPREHENSIVE METABOLIC PANEL
ALBUMIN: 3.6 g/dL (ref 3.5–5.0)
ALK PHOS: 82 U/L (ref 40–150)
ALT: 11 U/L (ref 0–55)
ANION GAP: 8 meq/L (ref 3–11)
AST: 12 U/L (ref 5–34)
BILIRUBIN TOTAL: 0.68 mg/dL (ref 0.20–1.20)
BUN: 24.7 mg/dL (ref 7.0–26.0)
CO2: 27 meq/L (ref 22–29)
Calcium: 9.6 mg/dL (ref 8.4–10.4)
Chloride: 106 mEq/L (ref 98–109)
Creatinine: 0.9 mg/dL (ref 0.6–1.1)
EGFR: 82 mL/min/{1.73_m2} — AB (ref >90)
GLUCOSE: 94 mg/dL (ref 70–140)
POTASSIUM: 4.3 meq/L (ref 3.5–5.1)
SODIUM: 141 meq/L (ref 136–145)
TOTAL PROTEIN: 8.2 g/dL (ref 6.4–8.3)

## 2016-03-07 LAB — CBC WITH DIFFERENTIAL/PLATELET
BASO%: 0.4 % (ref 0.0–2.0)
BASOS ABS: 0 10*3/uL (ref 0.0–0.1)
EOS ABS: 0.2 10*3/uL (ref 0.0–0.5)
EOS%: 2.8 % (ref 0.0–7.0)
HCT: 33.9 % — ABNORMAL LOW (ref 34.8–46.6)
HGB: 11.9 g/dL (ref 11.6–15.9)
LYMPH%: 34.7 % (ref 14.0–49.7)
MCH: 27.2 pg (ref 25.1–34.0)
MCHC: 35.1 g/dL (ref 31.5–36.0)
MCV: 77.4 fL — AB (ref 79.5–101.0)
MONO#: 0.5 10*3/uL (ref 0.1–0.9)
MONO%: 7.3 % (ref 0.0–14.0)
NEUT#: 4.1 10*3/uL (ref 1.5–6.5)
NEUT%: 54.8 % (ref 38.4–76.8)
PLATELETS: 268 10*3/uL (ref 145–400)
RBC: 4.38 10*6/uL (ref 3.70–5.45)
RDW: 15.9 % — ABNORMAL HIGH (ref 11.2–14.5)
WBC: 7.4 10*3/uL (ref 3.9–10.3)
lymph#: 2.6 10*3/uL (ref 0.9–3.3)
nRBC: 0 % (ref 0–0)

## 2016-03-07 LAB — PROTIME-INR
INR: 2.2 (ref 2.00–3.50)
PROTIME: 26.4 s — AB (ref 10.6–13.4)

## 2016-03-07 MED ORDER — SODIUM CHLORIDE 0.9 % IJ SOLN
10.0000 mL | INTRAMUSCULAR | Status: DC | PRN
  Administered 2016-03-07: 10 mL
  Filled 2016-03-07: qty 10

## 2016-03-07 MED ORDER — DIPHENHYDRAMINE HCL 25 MG PO CAPS
ORAL_CAPSULE | ORAL | Status: AC
  Filled 2016-03-07: qty 1

## 2016-03-07 MED ORDER — ACETAMINOPHEN 325 MG PO TABS
ORAL_TABLET | ORAL | Status: AC
  Filled 2016-03-07: qty 2

## 2016-03-07 MED ORDER — LORAZEPAM 2 MG/ML IJ SOLN
INTRAMUSCULAR | Status: AC
  Filled 2016-03-07: qty 1

## 2016-03-07 MED ORDER — TRASTUZUMAB CHEMO 150 MG IV SOLR
6.0000 mg/kg | Freq: Once | INTRAVENOUS | Status: AC
  Administered 2016-03-07: 798 mg via INTRAVENOUS
  Filled 2016-03-07: qty 38

## 2016-03-07 MED ORDER — LORAZEPAM 2 MG/ML IJ SOLN
1.0000 mg | Freq: Once | INTRAMUSCULAR | Status: AC | PRN
  Administered 2016-03-07: 1 mg via INTRAVENOUS

## 2016-03-07 MED ORDER — HEPARIN SOD (PORK) LOCK FLUSH 100 UNIT/ML IV SOLN
500.0000 [IU] | Freq: Once | INTRAVENOUS | Status: AC | PRN
  Administered 2016-03-07: 500 [IU]
  Filled 2016-03-07: qty 5

## 2016-03-07 MED ORDER — SODIUM CHLORIDE 0.9 % IV SOLN
Freq: Once | INTRAVENOUS | Status: AC
  Administered 2016-03-07: 10:00:00 via INTRAVENOUS

## 2016-03-07 MED ORDER — ACETAMINOPHEN 325 MG PO TABS
650.0000 mg | ORAL_TABLET | Freq: Once | ORAL | Status: AC
  Administered 2016-03-07: 650 mg via ORAL

## 2016-03-07 MED ORDER — DIPHENHYDRAMINE HCL 25 MG PO CAPS
25.0000 mg | ORAL_CAPSULE | Freq: Once | ORAL | Status: AC
  Administered 2016-03-07: 25 mg via ORAL

## 2016-03-07 NOTE — Patient Instructions (Signed)
[facility] Discharge Instructions for Patients Receiving Chemotherapy  Today you received the following chemotherapy agents:  Herceptin  To help prevent nausea and vomiting after your treatment, we encourage you to take your nausea medication as prescribed.   If you develop nausea and vomiting that is not controlled by your nausea medication, call the clinic.   BELOW ARE SYMPTOMS THAT SHOULD BE REPORTED IMMEDIATELY:  *FEVER GREATER THAN 100.5 F  *CHILLS WITH OR WITHOUT FEVER  NAUSEA AND VOMITING THAT IS NOT CONTROLLED WITH YOUR NAUSEA MEDICATION  *UNUSUAL SHORTNESS OF BREATH  *UNUSUAL BRUISING OR BLEEDING  TENDERNESS IN MOUTH AND THROAT WITH OR WITHOUT PRESENCE OF ULCERS  *URINARY PROBLEMS  *BOWEL PROBLEMS  UNUSUAL RASH Items with * indicate a potential emergency and should be followed up as soon as possible.  Feel free to call the clinic you have any questions or concerns. The clinic phone number is [phone].  Please show the CHEMO ALERT CARD at check-in to the Emergency Department and triage nurse.   

## 2016-03-09 ENCOUNTER — Other Ambulatory Visit (HOSPITAL_COMMUNITY): Payer: Self-pay | Admitting: *Deleted

## 2016-03-09 MED ORDER — CARVEDILOL 3.125 MG PO TABS: 3.1250 mg | ORAL_TABLET | Freq: Two times a day (BID) | ORAL | Status: DC

## 2016-03-27 ENCOUNTER — Other Ambulatory Visit: Payer: Self-pay

## 2016-03-27 MED ORDER — VENLAFAXINE HCL ER 37.5 MG PO CP24: 37.5000 mg | ORAL_CAPSULE | Freq: Every day | ORAL | 5 refills | Status: DC

## 2016-03-27 MED ORDER — WARFARIN SODIUM 5 MG PO TABS: 5.0000 mg | ORAL_TABLET | Freq: Every day | ORAL | 0 refills | Status: DC

## 2016-03-28 ENCOUNTER — Ambulatory Visit (HOSPITAL_BASED_OUTPATIENT_CLINIC_OR_DEPARTMENT_OTHER): Payer: Commercial Managed Care - HMO

## 2016-03-28 ENCOUNTER — Other Ambulatory Visit (HOSPITAL_BASED_OUTPATIENT_CLINIC_OR_DEPARTMENT_OTHER): Payer: Commercial Managed Care - HMO

## 2016-03-28 VITALS — BP 119/78 | HR 65 | Temp 98.6°F | Resp 16

## 2016-03-28 DIAGNOSIS — C50919 Malignant neoplasm of unspecified site of unspecified female breast: Secondary | ICD-10-CM

## 2016-03-28 DIAGNOSIS — C50911 Malignant neoplasm of unspecified site of right female breast: Secondary | ICD-10-CM | POA: Diagnosis not present

## 2016-03-28 DIAGNOSIS — C50812 Malignant neoplasm of overlapping sites of left female breast: Secondary | ICD-10-CM

## 2016-03-28 DIAGNOSIS — Z7901 Long term (current) use of anticoagulants: Secondary | ICD-10-CM | POA: Diagnosis not present

## 2016-03-28 DIAGNOSIS — Z5112 Encounter for antineoplastic immunotherapy: Secondary | ICD-10-CM | POA: Diagnosis not present

## 2016-03-28 DIAGNOSIS — C50912 Malignant neoplasm of unspecified site of left female breast: Secondary | ICD-10-CM

## 2016-03-28 LAB — CBC WITH DIFFERENTIAL/PLATELET
BASO%: 0.4 % (ref 0.0–2.0)
BASOS ABS: 0 10*3/uL (ref 0.0–0.1)
EOS ABS: 0.2 10*3/uL (ref 0.0–0.5)
EOS%: 2.5 % (ref 0.0–7.0)
HEMATOCRIT: 34.1 % — AB (ref 34.8–46.6)
HGB: 11.8 g/dL (ref 11.6–15.9)
LYMPH%: 33.5 % (ref 14.0–49.7)
MCH: 27 pg (ref 25.1–34.0)
MCHC: 34.6 g/dL (ref 31.5–36.0)
MCV: 78 fL — AB (ref 79.5–101.0)
MONO#: 0.6 10*3/uL (ref 0.1–0.9)
MONO%: 7 % (ref 0.0–14.0)
NEUT#: 4.7 10*3/uL (ref 1.5–6.5)
NEUT%: 56.6 % (ref 38.4–76.8)
NRBC: 0 % (ref 0–0)
PLATELETS: 239 10*3/uL (ref 145–400)
RBC: 4.37 10*6/uL (ref 3.70–5.45)
RDW: 16 % — ABNORMAL HIGH (ref 11.2–14.5)
WBC: 8.2 10*3/uL (ref 3.9–10.3)
lymph#: 2.8 10*3/uL (ref 0.9–3.3)

## 2016-03-28 LAB — COMPREHENSIVE METABOLIC PANEL
ALT: 12 U/L (ref 0–55)
ANION GAP: 8 meq/L (ref 3–11)
AST: 14 U/L (ref 5–34)
Albumin: 3.5 g/dL (ref 3.5–5.0)
Alkaline Phosphatase: 89 U/L (ref 40–150)
BILIRUBIN TOTAL: 0.56 mg/dL (ref 0.20–1.20)
BUN: 18.6 mg/dL (ref 7.0–26.0)
CALCIUM: 9.8 mg/dL (ref 8.4–10.4)
CO2: 26 meq/L (ref 22–29)
Chloride: 108 mEq/L (ref 98–109)
Creatinine: 0.8 mg/dL (ref 0.6–1.1)
EGFR: 85 mL/min/{1.73_m2} — AB (ref >90)
Glucose: 96 mg/dl (ref 70–140)
Potassium: 4.1 mEq/L (ref 3.5–5.1)
Sodium: 143 mEq/L (ref 136–145)
TOTAL PROTEIN: 8 g/dL (ref 6.4–8.3)

## 2016-03-28 LAB — PROTIME-INR
INR: 2.2 (ref 2.00–3.50)
PROTIME: 26.4 s — AB (ref 10.6–13.4)

## 2016-03-28 MED ORDER — TRASTUZUMAB CHEMO 150 MG IV SOLR
6.0000 mg/kg | Freq: Once | INTRAVENOUS | Status: AC
  Administered 2016-03-28: 798 mg via INTRAVENOUS
  Filled 2016-03-28: qty 38

## 2016-03-28 MED ORDER — SODIUM CHLORIDE 0.9 % IV SOLN
Freq: Once | INTRAVENOUS | Status: AC
  Administered 2016-03-28: 11:00:00 via INTRAVENOUS

## 2016-03-28 MED ORDER — LORAZEPAM 2 MG/ML IJ SOLN
INTRAMUSCULAR | Status: AC
  Filled 2016-03-28: qty 1

## 2016-03-28 MED ORDER — ACETAMINOPHEN 325 MG PO TABS
650.0000 mg | ORAL_TABLET | Freq: Once | ORAL | Status: AC
  Administered 2016-03-28: 650 mg via ORAL

## 2016-03-28 MED ORDER — HEPARIN SOD (PORK) LOCK FLUSH 100 UNIT/ML IV SOLN
500.0000 [IU] | Freq: Once | INTRAVENOUS | Status: AC | PRN
  Administered 2016-03-28: 500 [IU]
  Filled 2016-03-28: qty 5

## 2016-03-28 MED ORDER — DIPHENHYDRAMINE HCL 25 MG PO CAPS
25.0000 mg | ORAL_CAPSULE | Freq: Once | ORAL | Status: AC
  Administered 2016-03-28: 25 mg via ORAL

## 2016-03-28 MED ORDER — ACETAMINOPHEN 325 MG PO TABS
ORAL_TABLET | ORAL | Status: AC
  Filled 2016-03-28: qty 2

## 2016-03-28 MED ORDER — LORAZEPAM 2 MG/ML IJ SOLN
1.0000 mg | Freq: Once | INTRAMUSCULAR | Status: AC | PRN
  Administered 2016-03-28: 1 mg via INTRAVENOUS

## 2016-03-28 MED ORDER — SODIUM CHLORIDE 0.9 % IJ SOLN
10.0000 mL | INTRAMUSCULAR | Status: DC | PRN
  Administered 2016-03-28: 10 mL
  Filled 2016-03-28: qty 10

## 2016-03-28 MED ORDER — DIPHENHYDRAMINE HCL 25 MG PO CAPS
ORAL_CAPSULE | ORAL | Status: AC
  Filled 2016-03-28: qty 1

## 2016-03-28 NOTE — Patient Instructions (Signed)
[facility] Discharge Instructions for Patients Receiving Chemotherapy  Today you received the following chemotherapy agents: Herceptin   To help prevent nausea and vomiting after your treatment, we encourage you to take your nausea medication as directed.    If you develop nausea and vomiting that is not controlled by your nausea medication, call the clinic.   BELOW ARE SYMPTOMS THAT SHOULD BE REPORTED IMMEDIATELY:  *FEVER GREATER THAN 100.5 F  *CHILLS WITH OR WITHOUT FEVER  NAUSEA AND VOMITING THAT IS NOT CONTROLLED WITH YOUR NAUSEA MEDICATION  *UNUSUAL SHORTNESS OF BREATH  *UNUSUAL BRUISING OR BLEEDING  TENDERNESS IN MOUTH AND THROAT WITH OR WITHOUT PRESENCE OF ULCERS  *URINARY PROBLEMS  *BOWEL PROBLEMS  UNUSUAL RASH Items with * indicate a potential emergency and should be followed up as soon as possible.  Feel free to call the clinic you have any questions or concerns. The clinic phone number is [phone].  Please show the CHEMO ALERT CARD at check-in to the Emergency Department and triage nurse.   

## 2016-04-17 ENCOUNTER — Other Ambulatory Visit: Payer: Self-pay | Admitting: *Deleted

## 2016-04-17 DIAGNOSIS — D689 Coagulation defect, unspecified: Secondary | ICD-10-CM

## 2016-04-17 DIAGNOSIS — C50919 Malignant neoplasm of unspecified site of unspecified female breast: Secondary | ICD-10-CM

## 2016-04-18 ENCOUNTER — Ambulatory Visit (HOSPITAL_BASED_OUTPATIENT_CLINIC_OR_DEPARTMENT_OTHER): Payer: Commercial Managed Care - HMO

## 2016-04-18 ENCOUNTER — Other Ambulatory Visit (HOSPITAL_BASED_OUTPATIENT_CLINIC_OR_DEPARTMENT_OTHER): Payer: Commercial Managed Care - HMO

## 2016-04-18 VITALS — BP 130/70 | HR 59 | Temp 99.0°F | Resp 18

## 2016-04-18 DIAGNOSIS — Z5112 Encounter for antineoplastic immunotherapy: Secondary | ICD-10-CM | POA: Diagnosis not present

## 2016-04-18 DIAGNOSIS — C50911 Malignant neoplasm of unspecified site of right female breast: Secondary | ICD-10-CM | POA: Diagnosis not present

## 2016-04-18 DIAGNOSIS — C50812 Malignant neoplasm of overlapping sites of left female breast: Secondary | ICD-10-CM

## 2016-04-18 DIAGNOSIS — C50919 Malignant neoplasm of unspecified site of unspecified female breast: Secondary | ICD-10-CM

## 2016-04-18 DIAGNOSIS — D689 Coagulation defect, unspecified: Secondary | ICD-10-CM

## 2016-04-18 DIAGNOSIS — Z7901 Long term (current) use of anticoagulants: Secondary | ICD-10-CM | POA: Diagnosis not present

## 2016-04-18 LAB — CBC WITH DIFFERENTIAL/PLATELET
BASO%: 0.1 % (ref 0.0–2.0)
BASOS ABS: 0 10*3/uL (ref 0.0–0.1)
EOS ABS: 0.2 10*3/uL (ref 0.0–0.5)
EOS%: 2.4 % (ref 0.0–7.0)
HCT: 31.4 % — ABNORMAL LOW (ref 34.8–46.6)
HEMOGLOBIN: 10.9 g/dL — AB (ref 11.6–15.9)
LYMPH%: 35.5 % (ref 14.0–49.7)
MCH: 27.4 pg (ref 25.1–34.0)
MCHC: 34.7 g/dL (ref 31.5–36.0)
MCV: 78.9 fL — AB (ref 79.5–101.0)
MONO#: 0.6 10*3/uL (ref 0.1–0.9)
MONO%: 8.3 % (ref 0.0–14.0)
NEUT#: 3.8 10*3/uL (ref 1.5–6.5)
NEUT%: 53.7 % (ref 38.4–76.8)
Platelets: 234 10*3/uL (ref 145–400)
RBC: 3.98 10*6/uL (ref 3.70–5.45)
RDW: 15.8 % — AB (ref 11.2–14.5)
WBC: 7.1 10*3/uL (ref 3.9–10.3)
lymph#: 2.5 10*3/uL (ref 0.9–3.3)

## 2016-04-18 LAB — COMPREHENSIVE METABOLIC PANEL
ALBUMIN: 3.3 g/dL — AB (ref 3.5–5.0)
ALK PHOS: 81 U/L (ref 40–150)
ALT: 9 U/L (ref 0–55)
AST: 11 U/L (ref 5–34)
Anion Gap: 8 mEq/L (ref 3–11)
BUN: 14.9 mg/dL (ref 7.0–26.0)
CALCIUM: 9.4 mg/dL (ref 8.4–10.4)
CO2: 25 mEq/L (ref 22–29)
Chloride: 108 mEq/L (ref 98–109)
Creatinine: 0.9 mg/dL (ref 0.6–1.1)
EGFR: 78 mL/min/{1.73_m2} — AB (ref >90)
GLUCOSE: 96 mg/dL (ref 70–140)
POTASSIUM: 3.9 meq/L (ref 3.5–5.1)
SODIUM: 142 meq/L (ref 136–145)
Total Bilirubin: 0.66 mg/dL (ref 0.20–1.20)
Total Protein: 7.8 g/dL (ref 6.4–8.3)

## 2016-04-18 LAB — PROTIME-INR
INR: 1.8 — AB (ref 2.00–3.50)
PROTIME: 21.6 s — AB (ref 10.6–13.4)

## 2016-04-18 MED ORDER — SODIUM CHLORIDE 0.9 % IV SOLN
Freq: Once | INTRAVENOUS | Status: AC
  Administered 2016-04-18: 10:00:00 via INTRAVENOUS

## 2016-04-18 MED ORDER — ACETAMINOPHEN 325 MG PO TABS
650.0000 mg | ORAL_TABLET | Freq: Once | ORAL | Status: AC
Start: 2016-04-18 — End: 2016-04-18
  Administered 2016-04-18: 650 mg via ORAL

## 2016-04-18 MED ORDER — TRASTUZUMAB CHEMO 150 MG IV SOLR
798.0000 mg | Freq: Once | INTRAVENOUS | Status: AC
  Administered 2016-04-18: 798 mg via INTRAVENOUS
  Filled 2016-04-18: qty 38

## 2016-04-18 MED ORDER — DIPHENHYDRAMINE HCL 25 MG PO CAPS
25.0000 mg | ORAL_CAPSULE | Freq: Once | ORAL | Status: AC
  Administered 2016-04-18: 25 mg via ORAL

## 2016-04-18 MED ORDER — HEPARIN SOD (PORK) LOCK FLUSH 100 UNIT/ML IV SOLN
500.0000 [IU] | Freq: Once | INTRAVENOUS | Status: AC | PRN
  Administered 2016-04-18: 500 [IU]
  Filled 2016-04-18: qty 5

## 2016-04-18 MED ORDER — LORAZEPAM 2 MG/ML IJ SOLN
1.0000 mg | Freq: Once | INTRAMUSCULAR | Status: AC | PRN
  Administered 2016-04-18: 1 mg via INTRAVENOUS

## 2016-04-18 MED ORDER — DIPHENHYDRAMINE HCL 25 MG PO CAPS
ORAL_CAPSULE | ORAL | Status: AC
  Filled 2016-04-18: qty 1

## 2016-04-18 MED ORDER — SODIUM CHLORIDE 0.9 % IJ SOLN
10.0000 mL | INTRAMUSCULAR | Status: DC | PRN
  Administered 2016-04-18: 10 mL
  Filled 2016-04-18: qty 10

## 2016-04-18 MED ORDER — LORAZEPAM 2 MG/ML IJ SOLN
INTRAMUSCULAR | Status: AC
  Filled 2016-04-18: qty 1

## 2016-04-18 MED ORDER — ACETAMINOPHEN 325 MG PO TABS
ORAL_TABLET | ORAL | Status: AC
  Filled 2016-04-18: qty 2

## 2016-04-18 NOTE — Patient Instructions (Signed)
Trastuzumab injection for infusion What is this medicine? TRASTUZUMAB (tras TOO zoo mab) is a monoclonal antibody. It is used to treat breast cancer and stomach cancer. This medicine may be used for other purposes; ask your health care provider or pharmacist if you have questions. What should I tell my health care provider before I take this medicine? They need to know if you have any of these conditions: -heart disease -heart failure -infection (especially a virus infection such as chickenpox, cold sores, or herpes) -lung or breathing disease, like asthma -recent or ongoing radiation therapy -an unusual or allergic reaction to trastuzumab, benzyl alcohol, or other medications, foods, dyes, or preservatives -pregnant or trying to get pregnant -breast-feeding How should I use this medicine? This drug is given as an infusion into a vein. It is administered in a hospital or clinic by a specially trained health care professional. Talk to your pediatrician regarding the use of this medicine in children. This medicine is not approved for use in children. Overdosage: If you think you have taken too much of this medicine contact a poison control center or emergency room at once. NOTE: This medicine is only for you. Do not share this medicine with others. What if I miss a dose? It is important not to miss a dose. Call your doctor or health care professional if you are unable to keep an appointment. What may interact with this medicine? -doxorubicin -warfarin This list may not describe all possible interactions. Give your health care provider a list of all the medicines, herbs, non-prescription drugs, or dietary supplements you use. Also tell them if you smoke, drink alcohol, or use illegal drugs. Some items may interact with your medicine. What should I watch for while using this medicine? Visit your doctor for checks on your progress. Report any side effects. Continue your course of treatment even  though you feel ill unless your doctor tells you to stop. Call your doctor or health care professional for advice if you get a fever, chills or sore throat, or other symptoms of a cold or flu. Do not treat yourself. Try to avoid being around people who are sick. You may experience fever, chills and shaking during your first infusion. These effects are usually mild and can be treated with other medicines. Report any side effects during the infusion to your health care professional. Fever and chills usually do not happen with later infusions. Do not become pregnant while taking this medicine or for 7 months after stopping it. Women should inform their doctor if they wish to become pregnant or think they might be pregnant. Women of child-bearing potential will need to have a negative pregnancy test before starting this medicine. There is a potential for serious side effects to an unborn child. Talk to your health care professional or pharmacist for more information. Do not breast-feed an infant while taking this medicine or for 7 months after stopping it. Women must use effective birth control with this medicine. What side effects may I notice from receiving this medicine? Side effects that you should report to your doctor or other health care professional as soon as possible: -breathing difficulties -chest pain or palpitations -cough -dizziness or fainting -fever or chills, sore throat -skin rash, itching or hives -swelling of the legs or ankles -unusually weak or tired Side effects that usually do not require medical attention (report to your doctor or other health care professional if they continue or are bothersome): -loss of appetite -headache -muscle aches -nausea This   list may not describe all possible side effects. Call your doctor for medical advice about side effects. You may report side effects to FDA at 1-800-FDA-1088. Where should I keep my medicine? This drug is given in a hospital  or clinic and will not be stored at home. NOTE: This sheet is a summary. It may not cover all possible information. If you have questions about this medicine, talk to your doctor, pharmacist, or health care provider.    2016, [employer] Standard. (2014-11-19 11:49:32)   

## 2016-05-07 ENCOUNTER — Encounter (HOSPITAL_COMMUNITY): Payer: Self-pay

## 2016-05-07 ENCOUNTER — Ambulatory Visit (HOSPITAL_BASED_OUTPATIENT_CLINIC_OR_DEPARTMENT_OTHER)
Admission: RE | Admit: 2016-05-07 | Discharge: 2016-05-07 | Disposition: A | Discharge status: Home / Self Care | Payer: Commercial Managed Care - HMO | Source: Ambulatory Visit | Attending: Cardiology | Admitting: Cardiology

## 2016-05-07 ENCOUNTER — Ambulatory Visit (HOSPITAL_COMMUNITY)
Admission: RE | Admit: 2016-05-07 | Discharge: 2016-05-07 | Disposition: A | Discharge status: Home / Self Care | Payer: Commercial Managed Care - HMO | Source: Ambulatory Visit | Attending: Internal Medicine | Admitting: Internal Medicine

## 2016-05-07 VITALS — BP 110/70 | HR 63 | Wt 291.0 lb

## 2016-05-07 DIAGNOSIS — I517 Cardiomegaly: Secondary | ICD-10-CM | POA: Insufficient documentation

## 2016-05-07 DIAGNOSIS — C50919 Malignant neoplasm of unspecified site of unspecified female breast: Secondary | ICD-10-CM

## 2016-05-07 DIAGNOSIS — Z0189 Encounter for other specified special examinations: Secondary | ICD-10-CM | POA: Diagnosis not present

## 2016-05-07 DIAGNOSIS — C50911 Malignant neoplasm of unspecified site of right female breast: Secondary | ICD-10-CM | POA: Diagnosis present

## 2016-05-07 NOTE — Patient Instructions (Signed)
Your physician recommends that you schedule a follow-up appointment in: 3 months with echocardiogram  

## 2016-05-07 NOTE — Progress Notes (Signed)
  Echocardiogram 2D Echocardiogram has been performed.  Jennette Dubin 05/07/2016, 9:55 AM

## 2016-05-08 ENCOUNTER — Other Ambulatory Visit: Payer: Self-pay

## 2016-05-08 DIAGNOSIS — C50919 Malignant neoplasm of unspecified site of unspecified female breast: Secondary | ICD-10-CM

## 2016-05-08 NOTE — Progress Notes (Signed)
Advanced Heart Failure Clinic Note   Patient ID: [name], female   DOB: 08/11/1951, 65 y.o.   MRN: [id number] Oncologist: Dr Jana Hakim  HPI: [name] is a 65 year old Davis a history of metastatic HER-2 positive breast carcinoma originally diagnosed September 2004. Started in R breast. Underwent neo-adjuvant chemo. During this time developed L breast CA. Underwent bilateral mastectomy. Lymph nodes +. Subsequently developed SVC syndrome Davis extensive right-sided clot - placed on coumadin.   She received a total of 6 cycles of Taxotere/Carbo/Herceptin, completed in April 2005, after which she began single agent Herceptin, given every 3 weeks. Plan to continue on Herceptin indefinitely.   She has been stable symptomatically.  Takes Lasix about once a week.  No exertional dyspnea or chest pain.  Weight is up 2 lbs.   Labs 11/15: K 4, creatinine 1.0   Labs 1/16: K 4.1, creatinine 1.0 Labs 8/16: K 4.3, creatinine 0.9 Labs 11/16: K 3.9, creatinine 0.9 Labs 5/17: K 3.9, creatinine 0.68  04/23/12 ECHO EF 60-65% Lateral s' 8.9 cm/s  07/30/12 ECHO EF 60-65% Lateral s' 8.3 cm/s  09/11/12 ECHO EF 60-65% Lateral s' 10.3 cm/s  12/22/12 ECHO 55-60% Lateral S' 9.8 RV mildly dilated  07/01/13 ECHO 55-60%, lateral s' 9.79, mild RV dilation, grade II DD 3/15 ECHO 55%, mild MR, lateral s' 9.6, GLS -19.2% 03/02/14 ECHO EF 55-60% Lateral S' 9.4 GLS - 21.9  11/15 ECHO EF 60-65%, lateral S' 6.8, GLS -17.2%, mild RV dilation Davis normal systolic function, mild MR.  2/16 ECHO EF 60-65%, lateral S' 14.4, GLS -31.5%, normal diastolic function, mild RV dilation Davis normal RV systolic function.  8/16 ECHO EF 60-65%, mild LVH, normal RV size and systolic function, lateral s' 13.2, GLS -17%.  11/16 ECHO EF 60-65% Lateral S' 10.2, GLS -20.2% 5/17 ECHO EF 60-65%, mild LVH, lateral s' 12.2, GLS -20.8%, grade II diastolic dysfunction, normal RV. 9/17 ECHO EF 17-61%, normal diastolic function, GLS -60.7%   ROS: All  systems negative except as listed in HPI, PMH and Problem List.  Past Medical History:  Diagnosis Date  . Breast cancer ([place])    mets to liver and lung  . Breast cancer metastasized to multiple sites ([place]) 02/26/2013  . History of chemotherapy Feb. 2006   taxotere/herceptin/carboplatin  . Hypertension   . Neuropathy ([place])   . Radiation 07/31/2006   left upper chest  . Radiation 06/17/2006-06/27/2006   6480 cGy bilat. chest wall  . SVC syndrome   . Thrombosis     Current Outpatient Prescriptions  Medication Sig Dispense Refill  . acetaminophen (TYLENOL) 500 MG tablet Take 1,000 mg by mouth every 6 (six) hours as needed for mild pain or fever.     Marland Kitchen albuterol (PROVENTIL HFA;VENTOLIN HFA) 108 (90 BASE) MCG/ACT inhaler Inhale 2 puffs into the lungs every 6 (six) hours as needed for wheezing. 1 Inhaler 5  . ALPRAZolam (XANAX) 1 MG tablet Take 1 tablet (1 mg total) by mouth 3 (three) times daily as needed. for anxiety 60 tablet 1  . amLODipine (NORVASC) 10 MG tablet Take 10 mg by mouth every morning.    . baclofen (LIORESAL) 10 MG tablet Take 1 tablet (10 mg total) by mouth 3 (three) times daily as needed. for muscle spams 270 tablet 3  . carvedilol (COREG) 3.125 MG tablet Take 1 tablet (3.125 mg total) by mouth 2 (two) times daily. 180 tablet 3  . gabapentin (NEURONTIN) 300 MG capsule TAKE 2 CAPSULES(600 MG) BY MOUTH THREE TIMES  DAILY 540 capsule 0  . losartan (COZAAR) 100 MG tablet Take 100 mg by mouth every morning.    Marland Kitchen spironolactone (ALDACTONE) 25 MG tablet Take 1 tablet (25 mg total) by mouth daily. 30 tablet 3  . venlafaxine XR (EFFEXOR-XR) 37.5 MG 24 hr capsule Take 1 capsule (37.5 mg total) by mouth daily Davis breakfast. 30 capsule 5  . warfarin (COUMADIN) 5 MG tablet Take 1 tablet (5 mg total) by mouth daily. 90 tablet 0  . diclofenac sodium (VOLTAREN) 1 % GEL Apply 2 g topically daily as needed (for pain). Apply to knees and shoulders (Patient not taking: Reported on 05/07/2016) 100  g 6  . furosemide (LASIX) 80 MG tablet Take 80 mg by mouth daily as needed for fluid.     . potassium chloride (K-DUR,KLOR-CON) 10 MEQ tablet Take 4 tablets (40 mEq total) by mouth daily as needed. (Patient not taking: Reported on 05/07/2016) 120 tablet 5  . temazepam (RESTORIL) 30 MG capsule TAKE 1 CAPSULE BY MOUTH EVERY DAY AT BEDTIME AS NEEDED FOR SLEEP (Patient not taking: Reported on 05/07/2016) 30 capsule 1   No current facility-administered medications for this encounter.    Facility-Administered Medications Ordered in Other Encounters  Medication Dose Route Frequency Provider Last Rate Last Dose  . sodium chloride flush (NS) 0.9 % injection 10 mL  10 mL Intravenous PRN Chauncey Cruel, MD   10 mL at 12/15/15 1200    Vitals:   05/07/16 0941  BP: 110/70  Pulse: 63  SpO2: 98%  Weight: 291 lb (132 kg)    PHYSICAL EXAM: General: NAD, obese. HEENT: normal  Neck: Thick. No JVD difficult to assess d/t body habitus but does not appear elevated; Carotids 2+ bilat; no bruits. No lymphadenopathy or thryomegaly appreciated.  Cor: PMI nondisplaced. Regular rate & rhythm. No rubs, gallops or murmurs. S/P bilateral mastectomies  Lungs: CTA, normal effort Abdomen: obese. soft, NT, ND. +BS  Extremities: no cyanosis, clubbing, rash.  Trace edema.  Neuro: alert & oriented x 3, cranial nerves grossly intact. moves all 4 extremities w/o difficulty. Affect pleasant.  ASSESSMENT & PLAN: 1) L Breast Cancer s/p bilateral mastectomies:  Symptomatically stable.  Echo was reviewed today: EF stable, strain mildly lower. She will be continuing Herceptin indefinitely.   - Repeat echo and office followup in 3 months (down from 4 months as strain was slightly decreased).  2) Suspected OSA: She has not wanted to do a sleep study.    3) HTN: BP is controlled on current regimen.  Loralie Champagne 05/08/2016

## 2016-05-09 ENCOUNTER — Ambulatory Visit (HOSPITAL_BASED_OUTPATIENT_CLINIC_OR_DEPARTMENT_OTHER): Payer: Commercial Managed Care - HMO

## 2016-05-09 ENCOUNTER — Other Ambulatory Visit (HOSPITAL_BASED_OUTPATIENT_CLINIC_OR_DEPARTMENT_OTHER): Payer: Commercial Managed Care - HMO

## 2016-05-09 ENCOUNTER — Ambulatory Visit (HOSPITAL_BASED_OUTPATIENT_CLINIC_OR_DEPARTMENT_OTHER): Payer: Commercial Managed Care - HMO | Admitting: Oncology

## 2016-05-09 ENCOUNTER — Ambulatory Visit: Payer: Commercial Managed Care - HMO

## 2016-05-09 VITALS — BP 131/65 | HR 61 | Temp 98.6°F | Resp 18 | Ht 63.0 in | Wt 288.0 lb

## 2016-05-09 DIAGNOSIS — C50919 Malignant neoplasm of unspecified site of unspecified female breast: Secondary | ICD-10-CM

## 2016-05-09 DIAGNOSIS — C7951 Secondary malignant neoplasm of bone: Secondary | ICD-10-CM

## 2016-05-09 DIAGNOSIS — C50912 Malignant neoplasm of unspecified site of left female breast: Secondary | ICD-10-CM | POA: Diagnosis not present

## 2016-05-09 DIAGNOSIS — C50911 Malignant neoplasm of unspecified site of right female breast: Secondary | ICD-10-CM

## 2016-05-09 DIAGNOSIS — Z5112 Encounter for antineoplastic immunotherapy: Secondary | ICD-10-CM | POA: Diagnosis not present

## 2016-05-09 DIAGNOSIS — C787 Secondary malignant neoplasm of liver and intrahepatic bile duct: Secondary | ICD-10-CM

## 2016-05-09 DIAGNOSIS — Z86718 Personal history of other venous thrombosis and embolism: Secondary | ICD-10-CM

## 2016-05-09 DIAGNOSIS — Z171 Estrogen receptor negative status [ER-]: Secondary | ICD-10-CM

## 2016-05-09 DIAGNOSIS — Z95828 Presence of other vascular implants and grafts: Secondary | ICD-10-CM

## 2016-05-09 LAB — COMPREHENSIVE METABOLIC PANEL
ALBUMIN: 3.5 g/dL (ref 3.5–5.0)
ALK PHOS: 79 U/L (ref 40–150)
ALT: 10 U/L (ref 0–55)
ANION GAP: 9 meq/L (ref 3–11)
AST: 11 U/L (ref 5–34)
BILIRUBIN TOTAL: 0.67 mg/dL (ref 0.20–1.20)
BUN: 20.1 mg/dL (ref 7.0–26.0)
CO2: 25 mEq/L (ref 22–29)
Calcium: 9.5 mg/dL (ref 8.4–10.4)
Chloride: 106 mEq/L (ref 98–109)
Creatinine: 0.9 mg/dL (ref 0.6–1.1)
EGFR: 74 mL/min/{1.73_m2} — AB (ref >90)
Glucose: 102 mg/dl (ref 70–140)
Potassium: 4.2 mEq/L (ref 3.5–5.1)
Sodium: 141 mEq/L (ref 136–145)
TOTAL PROTEIN: 8.1 g/dL (ref 6.4–8.3)

## 2016-05-09 LAB — CBC WITH DIFFERENTIAL/PLATELET
BASO%: 0.7 % (ref 0.0–2.0)
BASOS ABS: 0 10*3/uL (ref 0.0–0.1)
EOS ABS: 0.2 10*3/uL (ref 0.0–0.5)
EOS%: 2.7 % (ref 0.0–7.0)
HCT: 34.8 % (ref 34.8–46.6)
HEMOGLOBIN: 11.6 g/dL (ref 11.6–15.9)
LYMPH%: 39.1 % (ref 14.0–49.7)
MCH: 27.1 pg (ref 25.1–34.0)
MCHC: 33.4 g/dL (ref 31.5–36.0)
MCV: 81 fL (ref 79.5–101.0)
MONO#: 0.5 10*3/uL (ref 0.1–0.9)
MONO%: 7.7 % (ref 0.0–14.0)
NEUT%: 49.8 % (ref 38.4–76.8)
NEUTROS ABS: 3.4 10*3/uL (ref 1.5–6.5)
PLATELETS: 242 10*3/uL (ref 145–400)
RBC: 4.3 10*6/uL (ref 3.70–5.45)
RDW: 15.8 % — ABNORMAL HIGH (ref 11.2–14.5)
WBC: 6.8 10*3/uL (ref 3.9–10.3)
lymph#: 2.7 10*3/uL (ref 0.9–3.3)

## 2016-05-09 MED ORDER — LORAZEPAM 2 MG/ML IJ SOLN
INTRAMUSCULAR | Status: AC
  Filled 2016-05-09: qty 1

## 2016-05-09 MED ORDER — SODIUM CHLORIDE 0.9 % IV SOLN
Freq: Once | INTRAVENOUS | Status: AC
  Administered 2016-05-09: 10:00:00 via INTRAVENOUS

## 2016-05-09 MED ORDER — HEPARIN SOD (PORK) LOCK FLUSH 100 UNIT/ML IV SOLN
500.0000 [IU] | Freq: Once | INTRAVENOUS | Status: AC | PRN
  Administered 2016-05-09: 500 [IU]
  Filled 2016-05-09: qty 5

## 2016-05-09 MED ORDER — LORAZEPAM 2 MG/ML IJ SOLN
1.0000 mg | Freq: Once | INTRAMUSCULAR | Status: AC | PRN
  Administered 2016-05-09: 1 mg via INTRAVENOUS

## 2016-05-09 MED ORDER — DIPHENHYDRAMINE HCL 25 MG PO CAPS
ORAL_CAPSULE | ORAL | Status: AC
  Filled 2016-05-09: qty 1

## 2016-05-09 MED ORDER — ACETAMINOPHEN 325 MG PO TABS
ORAL_TABLET | ORAL | Status: AC
  Filled 2016-05-09: qty 2

## 2016-05-09 MED ORDER — ACETAMINOPHEN 325 MG PO TABS
650.0000 mg | ORAL_TABLET | Freq: Once | ORAL | Status: AC
  Administered 2016-05-09: 650 mg via ORAL

## 2016-05-09 MED ORDER — DIPHENHYDRAMINE HCL 25 MG PO CAPS
25.0000 mg | ORAL_CAPSULE | Freq: Once | ORAL | Status: AC
  Administered 2016-05-09: 25 mg via ORAL

## 2016-05-09 MED ORDER — SODIUM CHLORIDE 0.9 % IJ SOLN
10.0000 mL | INTRAMUSCULAR | Status: DC | PRN
  Administered 2016-05-09: 10 mL
  Filled 2016-05-09: qty 10

## 2016-05-09 MED ORDER — TRASTUZUMAB CHEMO 150 MG IV SOLR
798.0000 mg | Freq: Once | INTRAVENOUS | Status: AC
  Administered 2016-05-09: 798 mg via INTRAVENOUS
  Filled 2016-05-09: qty 38

## 2016-05-09 MED ORDER — SODIUM CHLORIDE 0.9% FLUSH
10.0000 mL | INTRAVENOUS | Status: DC | PRN
  Administered 2016-05-09: 10 mL via INTRAVENOUS
  Filled 2016-05-09: qty 10

## 2016-05-09 NOTE — Progress Notes (Signed)
[place]  [phone] [phone]  OFFICE PROGRESS NOTE  ID: [name]   DOB: 04/11/1951  MR#: [id number]  XNA#:[id number]  PCP: Yolanda Kiel, MD GYN:  SU:  OTHER MD: [name], [name], [name]  CC: Metastatic HER-2 positive, estrogen receptor negative thank you thank you breast cancer.  CURRENT TREATMENT: Trastuzumab  BREAST CANCER HISTORY:   From the earlier summary:  [name] is 65 years old [name] female.  This woman has been in good health all of her life.  She noted a swelling and discomfort in her right breast in June 2004.  She was seen in the Emergency Room in [place] and was treated for mastitis.  She was treated for a number of months with mastitis and the swelling did not get better. She was given hydrocodone and Cipro.  Finally, the swelling did get better and ultimately the nipple became retracted and she noticed some dimpling in her skin. She had a mammogram in July of 2004 in [place] with subsequent mammogram on May 27, 2003, by Dr. Isaiah Davis.  Mammogram done on September 30 showed marked increased density in the left breast.  Biopsy was performed the same day.  It was noted at the 12 o'clock position, deep in the breast was a focal hypoechoic mass, at least 3.5 cm in diameter.  Biopsy did in fact show invasive in situ mammary carcinoma. This was felt to be both at least intermediate, high grade.  No definite lymphovascular invasion was identified. ER and PR negative, Her2 testing positive. [name] continues to have pain in her breast.  She continues to take hydrocodone a number of times a day.  She has been seen by Dr. Rosana Hoes, who felt that neoadjuvant chemotherapy would be required.    Initial staging studies showed evidence of liver and lung mets.   Patient also has evidence of bone lesions. Patient started neoadjuvant chemotherapy, Taxotere/Carbo/Herceptin in October 2004.    Patient had a CT scan in December 2004 which demonstrated extensive clot in the SVC innominate vein, bilateral jugular vein and  She was started on anticoagulation therapy. She received a total of 6 cycles of Taxotere/Carbo/Herceptin, completed in April 2005."  Patient has been on  trastuzumab continued indefinitely; has also received lapatinib and capecitabine for variable intervals in 2007-2008. Most recent echo 12/01/2013 showed an ejection fraction of 55%. She is status post bilateral mastectomies with bilateral axillary lymph node dissection 12/07/2004, showing (a) on the right, a mypT1c ypN1 invasive ductal carcinoma, grade 3, estrogen and progesterone receptor negative, HER-2 positive, with an MIB-1 of 31% (b) on the left, ypT2 ypN1 invasive ductal carcinoma, grade 2, estrogen and progesterone receptor negative, HER-2 positive, with an MIB-1 of 35%. She is Status post radiation June through July of 2006, to the right chest wall, left chest wall, bilateral supraclavicular fossae, and bilateral axillary boosts; with additional radiation to the right and left chest walls and the central chest wall completed November of 2007. She is status post ixempra x9 completed August of 2009. 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 Patient has chronic pain, with negative PET scan 08/24/2013 (no evidence of active cancer). On Neurontin and Tramadol therapy.  Her subsequent history is as detailed below  INTERVAL HISTORY: [name] returns today for followup of her HER-2/neu positive metastatic breast cancer accompanied by her husband, [name]. She continues on trastuzumab every 21 days, with excellent  tolerance. Reported is working well. We just obtained a repeat echocardiogram which shows a well-preserved ejection fraction--the slight drop from her prior is acceptable  REVIEW OF SYSTEMS: [name] has quite a bit of knee pain and she  is looking for to going on Medicare so that perhaps she can have more treatment for that problem. She does have some exercise equipment at home which would allow her to do some cardio without injuring her knees further. She cannot walk for exercise. She is a bit deconditioned and short of breath particularly when walking upstairs. She has numbness as previously noted and some anxiety, but no depression. A detailed review of systems today was stable.  PAST MEDICAL HISTORY: Past Medical History:  Diagnosis Date  . Breast cancer ([place])    mets to liver and lung  . Breast cancer metastasized to multiple sites ([place]) 02/26/2013  . History of chemotherapy Feb. 2006   taxotere/herceptin/carboplatin  . Hypertension   . Neuropathy ([place])   . Radiation 07/31/2006   left upper chest  . Radiation 06/17/2006-06/27/2006   6480 cGy bilat. chest wall  . SVC syndrome   . Thrombosis     PAST SURGICAL HISTORY: Past Surgical History:  Procedure Laterality Date  . ANKLE SURGERY    . BACK SURGERY    . CHOLECYSTECTOMY  1989  . MASTECTOMY Bilateral   . PERIPHERALLY INSERTED CENTRAL CATHETER INSERTION    . TUBAL LIGATION  1986    FAMILY HISTORY Family History  Problem Relation Age of Onset  . Heart failure Father   . Cancer Father     Prostate cancer  . Heart failure Brother   . Cancer Brother     Prostate cancer  . Diabetes Maternal Aunt    She had three brothers, one died of gunshot wound, one of complications of diabetes mellitus and one of myocardial infarction.  She has no sisters.  Mother died of complications of brain metastasis in 47.  Father has had a myocardial infarction in 1999.  No history of breast or ovarian cancer in the family.     GYNECOLOGIC HISTORY:   Menarche at age 73.  Gravida 3, para 3.  First live birth at age 23.  No history of breast feeding. No history of hormonal replacement therapy.   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 Her husband used to work as a Art gallery manager, but is now retired. She has three children, Monette who lives in [place] and works as a Hydrographic surveyor, Financial risk analyst who lives in [place] and works as a Administrator, and Kingston who lives in [place] and also works as a Hydrographic surveyor. The patient has 12 grandchildren and 4 great-grandchildren. She attends a [employer]. Very involved with school kids.  ADVANCED DIRECTIVES:  Not in place  HEALTH MAINTENANCE: (Updated 06/12/2013) Social History  Substance Use Topics  . Smoking status: Never Smoker  . Smokeless tobacco: Never Used  . Alcohol use Yes     Comment: occasional     Colonoscopy: Never and "I don't want one"  PAP:  1987  Bone density:  Never  Lipid panel:  Not on file    Allergies  Allergen Reactions  . Penicillins Hives    PATIENT HAS TOLERATED CEPHALOSPORINGS  . Adhesive [Tape] Other (See Comments)    Tears skin     Current Outpatient Prescriptions  Medication Sig Dispense Refill  . acetaminophen (TYLENOL)  500 MG tablet Take 1,000 mg by mouth every 6 (six) hours as needed for mild pain or fever.     Marland Kitchen albuterol (PROVENTIL HFA;VENTOLIN HFA) 108 (90 BASE) MCG/ACT inhaler Inhale 2 puffs into the lungs every 6 (six) hours as needed for wheezing. 1 Inhaler 5  . ALPRAZolam (XANAX) 1 MG tablet Take 1 tablet (1 mg total) by mouth 3 (three) times daily as needed. for anxiety 60 tablet 1  . amLODipine (NORVASC) 10 MG tablet Take 10 mg by mouth every morning.    . baclofen (LIORESAL) 10 MG tablet Take 1 tablet (10 mg total) by mouth 3 (three) times daily as needed. for muscle spams 270 tablet 3  . carvedilol (COREG) 3.125 MG tablet Take 1 tablet (3.125 mg total) by mouth 2 (two) times daily. 180 tablet 3  . diclofenac sodium (VOLTAREN) 1 % GEL Apply 2 g topically daily as needed (for pain). Apply to knees and shoulders (Patient not taking: Reported on 05/07/2016) 100 g 6  . furosemide (LASIX) 80 MG tablet  Take 80 mg by mouth daily as needed for fluid.     Marland Kitchen gabapentin (NEURONTIN) 300 MG capsule TAKE 2 CAPSULES(600 MG) BY MOUTH THREE TIMES DAILY 540 capsule 0  . losartan (COZAAR) 100 MG tablet Take 100 mg by mouth every morning.    . potassium chloride (K-DUR,KLOR-CON) 10 MEQ tablet Take 4 tablets (40 mEq total) by mouth daily as needed. (Patient not taking: Reported on 05/07/2016) 120 tablet 5  . spironolactone (ALDACTONE) 25 MG tablet Take 1 tablet (25 mg total) by mouth daily. 30 tablet 3  . temazepam (RESTORIL) 30 MG capsule TAKE 1 CAPSULE BY MOUTH EVERY DAY AT BEDTIME AS NEEDED FOR SLEEP (Patient not taking: Reported on 05/07/2016) 30 capsule 1  . venlafaxine XR (EFFEXOR-XR) 37.5 MG 24 hr capsule Take 1 capsule (37.5 mg total) by mouth daily with breakfast. 30 capsule 5  . warfarin (COUMADIN) 5 MG tablet Take 1 tablet (5 mg total) by mouth daily. 90 tablet 0   No current facility-administered medications for this visit.    Facility-Administered Medications Ordered in Other Visits  Medication Dose Route Frequency Provider Last Rate Last Dose  . sodium chloride flush (NS) 0.9 % injection 10 mL  10 mL Intravenous PRN Chauncey Cruel, MD   10 mL at 12/15/15 1200    OBJECTIVE:  Middle aged African American woman Who appears stated age  66:   05/09/16 0943  BP: 131/65  Pulse: 61  Resp: 18  Temp: 98.6 F (37 C)     Body mass index is 51.02 kg/m.    ECOG FS: 1 Filed Weights   05/09/16 0943  Weight: 288 lb (130.6 kg)   Sclerae unicteric, pupils round and equal Oropharynx clear and moist-- no thrush or other lesions No cervical or supraclavicular adenopathy Lungs no rales or rhonchi Heart regular rate and rhythm Abd soft, nontender, positive bowel sounds MSK no focal spinal tenderness, no upper extremity lymphedema Neuro: nonfocal, well oriented, appropriate affect Breasts: Status post bilateral mastectomies, with no evidence of chest wall recurrence. Both axillae are  benign.   LAB RESULTS: Lab Results  Component Value Date   WBC 6.8 05/09/2016   NEUTROABS 3.4 05/09/2016   HGB 11.6 05/09/2016   HCT 34.8 05/09/2016   MCV 81.0 05/09/2016   PLT 242 05/09/2016      Chemistry      Component Value Date/Time   NA 142 04/18/2016 0911   K 3.9 04/18/2016  0911   CL 107 12/09/2014 0940   CL 104 01/30/2013 0850   CO2 25 04/18/2016 0911   BUN 14.9 04/18/2016 0911   CREATININE 0.9 04/18/2016 0911      Component Value Date/Time   CALCIUM 9.4 04/18/2016 0911   ALKPHOS 81 04/18/2016 0911   AST 11 04/18/2016 0911   ALT 9 04/18/2016 0911   BILITOT 0.66 04/18/2016 0911     STUDIES: Transthoracic Echocardiography  (Report amended )  Patient:    [name], [name] MR #:       [id number] Study Date: 05/07/2016 Gender:     F Age:        89 Height:     160 cm Weight:     133.1 kg BSA:        2.52 m^2 Pt. Status: Room:   [facility] [id number]  ATTENDING  Bensimhon, Lanae Crumbly   Bensimhon, Glynis Smiles  Bensimhon, Daniel  cc:  ------------------------------------------------------------------- LV EF: 55% -   60%  -------------------------------------------------------------------  ASSESSMENT: 65 y.o.  [place], [place], woman  (1)  with a history of inflammatory right breast cancer metastatic at presentation September 2004 with involvement of liver and bone, HER-2 positive, estrogen and progesterone receptor negative  (2) treated with carboplatin, docetaxel and Herceptin x 6 completed April 2005  (3) trastuzumab continued indefinitely;   (a) has also received lapatinib and capecitabine for variable intervals in 2007-2008.  (b) Most recent echo 05/07/2016 showed an ejection fraction of 55-60 %  To be performed every 6 months   (4) status post bilateral mastectomies with bilateral axillary lymph node dissection 12/07/2004, showing  (a) on the right, a mypT1c ypN1 invasive ductal carcinoma, grade 3,  estrogen and progesterone receptor negative, HER-2 positive, with an MIB-1 of 31%  (b) on the left, ypT2 ypN1 invasive ductal carcinoma, grade 2, estrogen and progesterone receptor negative, HER-2 positive, with an MIB-1 of 35%.  (5)  Status post radiation June through July of 2006, to the right chest wall, left chest wall, bilateral supraclavicular fossae, and bilateral axillary boosts; with additional radiation to the right and left chest walls and the central chest wall completed November of 2007  (6) status post Ixempra x9 completed August of 2009.  (7) history of superior vena caval syndrome, on life long anticoagulation   (8)  History of chemotherapy-induced neuropathy.   (9)  chronic pain, with negative PET scan 08/24/2013 (no evidence of active cancer). On Neurontin and Tramadol  (a) repeat PET scan December 2016 again negative.  (10) right upper extremity cellulitis, no bacteremia; treated with cephalexin /doxycycline for 2 weeks, with resolution   PLAN: Zia is now 13  years out from definitive diagnosis of her breast cancer and 11 years out from definitive surgery for her. There is no evidence of active disease clinically.  She is tolerating the trastuzumab well. She is having echocardiograms every 6 months at this point. She will have her next 1 accordingly in February of next year.  We usually obtain a PET scan at the end of the year, but since she is going to be changing insurance we will do that in February this time.  We are continuing to follow her INR for her history of superior vena cava syndrome. That is also stable.  I have encouraged her to exercise as much as she can despite the knee problems.  I have answered all her treatments as well as are her labs for the next 6  months.  She will see me again in March. She knows to call for any problems that may develop before her next visit here.  Chauncey Cruel, MD  05/09/16 9:51 AM

## 2016-05-09 NOTE — Patient Instructions (Signed)

## 2016-05-09 NOTE — Patient Instructions (Signed)
[facility] Discharge Instructions for Patients Receiving Chemotherapy  Today you received the following chemotherapy agents: Herceptin   To help prevent nausea and vomiting after your treatment, we encourage you to take your nausea medication as directed.    If you develop nausea and vomiting that is not controlled by your nausea medication, call the clinic.   BELOW ARE SYMPTOMS THAT SHOULD BE REPORTED IMMEDIATELY:  *FEVER GREATER THAN 100.5 F  *CHILLS WITH OR WITHOUT FEVER  NAUSEA AND VOMITING THAT IS NOT CONTROLLED WITH YOUR NAUSEA MEDICATION  *UNUSUAL SHORTNESS OF BREATH  *UNUSUAL BRUISING OR BLEEDING  TENDERNESS IN MOUTH AND THROAT WITH OR WITHOUT PRESENCE OF ULCERS  *URINARY PROBLEMS  *BOWEL PROBLEMS  UNUSUAL RASH Items with * indicate a potential emergency and should be followed up as soon as possible.  Feel free to call the clinic you have any questions or concerns. The clinic phone number is [phone].  Please show the CHEMO ALERT CARD at check-in to the Emergency Department and triage nurse.   

## 2016-05-14 ENCOUNTER — Other Ambulatory Visit: Payer: Self-pay | Admitting: Oncology

## 2016-05-14 DIAGNOSIS — C50919 Malignant neoplasm of unspecified site of unspecified female breast: Secondary | ICD-10-CM

## 2016-05-15 ENCOUNTER — Other Ambulatory Visit: Payer: Self-pay | Admitting: *Deleted

## 2016-05-15 MED ORDER — ALPRAZOLAM 1 MG PO TABS: 1.0000 mg | ORAL_TABLET | Freq: Three times a day (TID) | ORAL | 1 refills | Status: DC | PRN

## 2016-05-30 ENCOUNTER — Other Ambulatory Visit (HOSPITAL_BASED_OUTPATIENT_CLINIC_OR_DEPARTMENT_OTHER): Payer: Commercial Managed Care - HMO

## 2016-05-30 ENCOUNTER — Ambulatory Visit (HOSPITAL_BASED_OUTPATIENT_CLINIC_OR_DEPARTMENT_OTHER): Payer: Commercial Managed Care - HMO

## 2016-05-30 ENCOUNTER — Other Ambulatory Visit: Payer: Self-pay | Admitting: Oncology

## 2016-05-30 ENCOUNTER — Ambulatory Visit: Payer: Commercial Managed Care - HMO

## 2016-05-30 VITALS — BP 138/80 | HR 66 | Temp 98.6°F | Resp 18

## 2016-05-30 DIAGNOSIS — C50912 Malignant neoplasm of unspecified site of left female breast: Secondary | ICD-10-CM

## 2016-05-30 DIAGNOSIS — C50919 Malignant neoplasm of unspecified site of unspecified female breast: Secondary | ICD-10-CM

## 2016-05-30 DIAGNOSIS — C50911 Malignant neoplasm of unspecified site of right female breast: Secondary | ICD-10-CM

## 2016-05-30 DIAGNOSIS — Z86718 Personal history of other venous thrombosis and embolism: Secondary | ICD-10-CM | POA: Diagnosis not present

## 2016-05-30 DIAGNOSIS — C787 Secondary malignant neoplasm of liver and intrahepatic bile duct: Secondary | ICD-10-CM

## 2016-05-30 DIAGNOSIS — Z95828 Presence of other vascular implants and grafts: Secondary | ICD-10-CM

## 2016-05-30 DIAGNOSIS — Z5112 Encounter for antineoplastic immunotherapy: Secondary | ICD-10-CM | POA: Diagnosis not present

## 2016-05-30 LAB — CBC WITH DIFFERENTIAL/PLATELET
BASO%: 0.3 % (ref 0.0–2.0)
Basophils Absolute: 0 10*3/uL (ref 0.0–0.1)
EOS ABS: 0.1 10*3/uL (ref 0.0–0.5)
EOS%: 2.1 % (ref 0.0–7.0)
HCT: 32.8 % — ABNORMAL LOW (ref 34.8–46.6)
HEMOGLOBIN: 11.4 g/dL — AB (ref 11.6–15.9)
LYMPH%: 36.6 % (ref 14.0–49.7)
MCH: 27.2 pg (ref 25.1–34.0)
MCHC: 34.8 g/dL (ref 31.5–36.0)
MCV: 78.3 fL — ABNORMAL LOW (ref 79.5–101.0)
MONO#: 0.5 10*3/uL (ref 0.1–0.9)
MONO%: 7.5 % (ref 0.0–14.0)
NEUT%: 53.5 % (ref 38.4–76.8)
NEUTROS ABS: 3.6 10*3/uL (ref 1.5–6.5)
Platelets: 208 10*3/uL (ref 145–400)
RBC: 4.19 10*6/uL (ref 3.70–5.45)
RDW: 15.5 % — AB (ref 11.2–14.5)
WBC: 6.7 10*3/uL (ref 3.9–10.3)
lymph#: 2.5 10*3/uL (ref 0.9–3.3)

## 2016-05-30 LAB — COMPREHENSIVE METABOLIC PANEL
ALBUMIN: 3.3 g/dL — AB (ref 3.5–5.0)
ALK PHOS: 87 U/L (ref 40–150)
ALT: 9 U/L (ref 0–55)
AST: 10 U/L (ref 5–34)
Anion Gap: 9 mEq/L (ref 3–11)
BILIRUBIN TOTAL: 0.56 mg/dL (ref 0.20–1.20)
BUN: 16 mg/dL (ref 7.0–26.0)
CO2: 26 meq/L (ref 22–29)
Calcium: 9.1 mg/dL (ref 8.4–10.4)
Chloride: 108 mEq/L (ref 98–109)
Creatinine: 0.8 mg/dL (ref 0.6–1.1)
EGFR: 87 mL/min/{1.73_m2} — AB (ref >90)
GLUCOSE: 110 mg/dL (ref 70–140)
Potassium: 3.9 mEq/L (ref 3.5–5.1)
SODIUM: 142 meq/L (ref 136–145)
TOTAL PROTEIN: 7.8 g/dL (ref 6.4–8.3)

## 2016-05-30 LAB — PROTIME-INR
INR: 3.1 (ref 2.00–3.50)
Protime: 37.2 Seconds — ABNORMAL HIGH (ref 10.6–13.4)

## 2016-05-30 MED ORDER — TRASTUZUMAB CHEMO 150 MG IV SOLR
798.0000 mg | Freq: Once | INTRAVENOUS | Status: AC
  Administered 2016-05-30: 798 mg via INTRAVENOUS
  Filled 2016-05-30: qty 38

## 2016-05-30 MED ORDER — SODIUM CHLORIDE 0.9 % IV SOLN
Freq: Once | INTRAVENOUS | Status: AC
  Administered 2016-05-30: 10:00:00 via INTRAVENOUS

## 2016-05-30 MED ORDER — SODIUM CHLORIDE 0.9 % IJ SOLN
10.0000 mL | INTRAMUSCULAR | Status: DC | PRN
  Administered 2016-05-30: 10 mL
  Filled 2016-05-30: qty 10

## 2016-05-30 MED ORDER — LORAZEPAM 2 MG/ML IJ SOLN
INTRAMUSCULAR | Status: AC
  Filled 2016-05-30: qty 1

## 2016-05-30 MED ORDER — DIPHENHYDRAMINE HCL 25 MG PO CAPS
ORAL_CAPSULE | ORAL | Status: AC
  Filled 2016-05-30: qty 1

## 2016-05-30 MED ORDER — ACETAMINOPHEN 325 MG PO TABS
650.0000 mg | ORAL_TABLET | Freq: Once | ORAL | Status: AC
  Administered 2016-05-30: 650 mg via ORAL

## 2016-05-30 MED ORDER — HEPARIN SOD (PORK) LOCK FLUSH 100 UNIT/ML IV SOLN
500.0000 [IU] | Freq: Once | INTRAVENOUS | Status: AC | PRN
  Administered 2016-05-30: 500 [IU]
  Filled 2016-05-30: qty 5

## 2016-05-30 MED ORDER — DIPHENHYDRAMINE HCL 25 MG PO CAPS
25.0000 mg | ORAL_CAPSULE | Freq: Once | ORAL | Status: AC
  Administered 2016-05-30: 25 mg via ORAL

## 2016-05-30 MED ORDER — ACETAMINOPHEN 325 MG PO TABS
ORAL_TABLET | ORAL | Status: AC
  Filled 2016-05-30: qty 2

## 2016-05-30 MED ORDER — LORAZEPAM 2 MG/ML IJ SOLN
1.0000 mg | Freq: Once | INTRAMUSCULAR | Status: AC | PRN
  Administered 2016-05-30: 1 mg via INTRAVENOUS

## 2016-05-30 MED ORDER — SODIUM CHLORIDE 0.9 % IJ SOLN
10.0000 mL | INTRAMUSCULAR | Status: DC | PRN
  Administered 2016-05-30: 10 mL via INTRAVENOUS
  Filled 2016-05-30: qty 10

## 2016-05-30 NOTE — Patient Instructions (Signed)
[facility] Discharge Instructions for Patients Receiving Chemotherapy  Today you received the following chemotherapy agents, herceptin  To help prevent nausea and vomiting after your treatment, we encourage you to take your nausea medication as directed.  If you develop nausea and vomiting that is not controlled by your nausea medication, call the clinic.   BELOW ARE SYMPTOMS THAT SHOULD BE REPORTED IMMEDIATELY:  *FEVER GREATER THAN 100.5 F  *CHILLS WITH OR WITHOUT FEVER  NAUSEA AND VOMITING THAT IS NOT CONTROLLED WITH YOUR NAUSEA MEDICATION  *UNUSUAL SHORTNESS OF BREATH  *UNUSUAL BRUISING OR BLEEDING  TENDERNESS IN MOUTH AND THROAT WITH OR WITHOUT PRESENCE OF ULCERS  *URINARY PROBLEMS  *BOWEL PROBLEMS  UNUSUAL RASH Items with * indicate a potential emergency and should be followed up as soon as possible.  Feel free to call the clinic you have any questions or concerns. The clinic phone number is [phone].  Please show the [place] at check-in to the Emergency Department and triage nurse.

## 2016-05-31 ENCOUNTER — Telehealth: Payer: Self-pay | Admitting: *Deleted

## 2016-05-31 NOTE — Telephone Encounter (Signed)
INR 3.1   Current coumadin dose 5 mg daily  Per MD pt needs to decrease to 2.5 mg on Thursdays and Tuesdays and maintain 5 mg on other days   Recheck 10/31 as scheduled

## 2016-06-06 ENCOUNTER — Other Ambulatory Visit: Payer: Self-pay

## 2016-06-06 MED ORDER — BACLOFEN 10 MG PO TABS: 10.0000 mg | ORAL_TABLET | Freq: Three times a day (TID) | ORAL | 3 refills | Status: DC | PRN

## 2016-06-12 ENCOUNTER — Ambulatory Visit (HOSPITAL_COMMUNITY)
Admission: RE | Admit: 2016-06-12 | Discharge: 2016-06-12 | Disposition: A | Discharge status: Home / Self Care | Payer: Commercial Managed Care - HMO | Source: Ambulatory Visit | Attending: Oncology | Admitting: Oncology

## 2016-06-12 ENCOUNTER — Encounter (HOSPITAL_COMMUNITY)
Admission: RE | Admit: 2016-06-12 | Discharge: 2016-06-12 | Disposition: A | Discharge status: Home / Self Care | Payer: Commercial Managed Care - HMO | Source: Ambulatory Visit | Attending: Oncology | Admitting: Oncology

## 2016-06-12 ENCOUNTER — Encounter: Payer: Self-pay | Admitting: Oncology

## 2016-06-12 DIAGNOSIS — R918 Other nonspecific abnormal finding of lung field: Secondary | ICD-10-CM | POA: Insufficient documentation

## 2016-06-12 DIAGNOSIS — Z9013 Acquired absence of bilateral breasts and nipples: Secondary | ICD-10-CM | POA: Diagnosis not present

## 2016-06-12 DIAGNOSIS — C50911 Malignant neoplasm of unspecified site of right female breast: Secondary | ICD-10-CM | POA: Insufficient documentation

## 2016-06-12 DIAGNOSIS — C50912 Malignant neoplasm of unspecified site of left female breast: Secondary | ICD-10-CM

## 2016-06-12 DIAGNOSIS — D689 Coagulation defect, unspecified: Secondary | ICD-10-CM | POA: Diagnosis not present

## 2016-06-12 DIAGNOSIS — I517 Cardiomegaly: Secondary | ICD-10-CM | POA: Diagnosis not present

## 2016-06-12 LAB — GLUCOSE, CAPILLARY: Glucose-Capillary: 110 mg/dL — ABNORMAL HIGH (ref 65–99)

## 2016-06-12 MED ORDER — FLUDEOXYGLUCOSE F - 18 (FDG) INJECTION: 14.4000 | Freq: Once | INTRAVENOUS | Status: DC | PRN

## 2016-06-12 MED ORDER — IOPAMIDOL (ISOVUE-300) INJECTION 61%
75.0000 mL | Freq: Once | INTRAVENOUS | Status: AC | PRN
  Administered 2016-06-12: 75 mL via INTRAVENOUS

## 2016-06-13 ENCOUNTER — Telehealth: Payer: Self-pay | Admitting: *Deleted

## 2016-06-13 NOTE — Telephone Encounter (Signed)
FYI "I need patient services.  I had a PET and CT chest scan yesterday I need copies of."  Call transferred H.I.M.  Next scheduled F/U is 11-14-2016.

## 2016-06-20 ENCOUNTER — Other Ambulatory Visit (HOSPITAL_BASED_OUTPATIENT_CLINIC_OR_DEPARTMENT_OTHER): Payer: Commercial Managed Care - HMO

## 2016-06-20 ENCOUNTER — Ambulatory Visit: Payer: Commercial Managed Care - HMO

## 2016-06-20 ENCOUNTER — Ambulatory Visit (HOSPITAL_BASED_OUTPATIENT_CLINIC_OR_DEPARTMENT_OTHER): Payer: Commercial Managed Care - HMO

## 2016-06-20 ENCOUNTER — Other Ambulatory Visit: Payer: Self-pay | Admitting: *Deleted

## 2016-06-20 VITALS — BP 165/90 | HR 64 | Temp 98.6°F | Resp 18

## 2016-06-20 DIAGNOSIS — C787 Secondary malignant neoplasm of liver and intrahepatic bile duct: Secondary | ICD-10-CM

## 2016-06-20 DIAGNOSIS — C50911 Malignant neoplasm of unspecified site of right female breast: Secondary | ICD-10-CM

## 2016-06-20 DIAGNOSIS — C50919 Malignant neoplasm of unspecified site of unspecified female breast: Secondary | ICD-10-CM

## 2016-06-20 DIAGNOSIS — Z86718 Personal history of other venous thrombosis and embolism: Secondary | ICD-10-CM

## 2016-06-20 DIAGNOSIS — C50912 Malignant neoplasm of unspecified site of left female breast: Secondary | ICD-10-CM

## 2016-06-20 DIAGNOSIS — Z5112 Encounter for antineoplastic immunotherapy: Secondary | ICD-10-CM | POA: Diagnosis not present

## 2016-06-20 DIAGNOSIS — Z23 Encounter for immunization: Secondary | ICD-10-CM

## 2016-06-20 DIAGNOSIS — Z95828 Presence of other vascular implants and grafts: Secondary | ICD-10-CM

## 2016-06-20 LAB — COMPREHENSIVE METABOLIC PANEL
ALT: 9 U/L (ref 0–55)
ANION GAP: 8 meq/L (ref 3–11)
AST: 11 U/L (ref 5–34)
Albumin: 3.3 g/dL — ABNORMAL LOW (ref 3.5–5.0)
Alkaline Phosphatase: 95 U/L (ref 40–150)
BILIRUBIN TOTAL: 0.43 mg/dL (ref 0.20–1.20)
BUN: 16.2 mg/dL (ref 7.0–26.0)
CHLORIDE: 110 meq/L — AB (ref 98–109)
CO2: 24 meq/L (ref 22–29)
Calcium: 9.4 mg/dL (ref 8.4–10.4)
Creatinine: 0.8 mg/dL (ref 0.6–1.1)
Glucose: 101 mg/dl (ref 70–140)
POTASSIUM: 4.1 meq/L (ref 3.5–5.1)
Sodium: 142 mEq/L (ref 136–145)
Total Protein: 8.1 g/dL (ref 6.4–8.3)

## 2016-06-20 LAB — CBC WITH DIFFERENTIAL/PLATELET
BASO%: 0.7 % (ref 0.0–2.0)
Basophils Absolute: 0.1 10*3/uL (ref 0.0–0.1)
EOS ABS: 0.2 10*3/uL (ref 0.0–0.5)
EOS%: 2.4 % (ref 0.0–7.0)
HCT: 34.8 % (ref 34.8–46.6)
HGB: 11.8 g/dL (ref 11.6–15.9)
LYMPH%: 28.1 % (ref 14.0–49.7)
MCH: 27.2 pg (ref 25.1–34.0)
MCHC: 33.9 g/dL (ref 31.5–36.0)
MCV: 80.4 fL (ref 79.5–101.0)
MONO#: 0.5 10*3/uL (ref 0.1–0.9)
MONO%: 6.9 % (ref 0.0–14.0)
NEUT#: 4.7 10*3/uL (ref 1.5–6.5)
NEUT%: 61.9 % (ref 38.4–76.8)
PLATELETS: 249 10*3/uL (ref 145–400)
RBC: 4.32 10*6/uL (ref 3.70–5.45)
RDW: 15.6 % — ABNORMAL HIGH (ref 11.2–14.5)
WBC: 7.5 10*3/uL (ref 3.9–10.3)
lymph#: 2.1 10*3/uL (ref 0.9–3.3)

## 2016-06-20 LAB — PROTIME-INR
INR: 1.7 — AB (ref 2.00–3.50)
Protime: 20.4 Seconds — ABNORMAL HIGH (ref 10.6–13.4)

## 2016-06-20 MED ORDER — LORAZEPAM 2 MG/ML IJ SOLN
INTRAMUSCULAR | Status: AC
  Filled 2016-06-20: qty 1

## 2016-06-20 MED ORDER — ACETAMINOPHEN 325 MG PO TABS
ORAL_TABLET | ORAL | Status: AC
  Filled 2016-06-20: qty 2

## 2016-06-20 MED ORDER — TEMAZEPAM 30 MG PO CAPS: ORAL_CAPSULE | ORAL | 2 refills | Status: DC

## 2016-06-20 MED ORDER — ACETAMINOPHEN 325 MG PO TABS
650.0000 mg | ORAL_TABLET | Freq: Once | ORAL | Status: AC
  Administered 2016-06-20: 650 mg via ORAL

## 2016-06-20 MED ORDER — SODIUM CHLORIDE 0.9 % IV SOLN
Freq: Once | INTRAVENOUS | Status: AC
  Administered 2016-06-20: 11:00:00 via INTRAVENOUS

## 2016-06-20 MED ORDER — SODIUM CHLORIDE 0.9 % IJ SOLN
10.0000 mL | INTRAMUSCULAR | Status: DC | PRN
  Administered 2016-06-20: 10 mL
  Filled 2016-06-20: qty 10

## 2016-06-20 MED ORDER — HEPARIN SOD (PORK) LOCK FLUSH 100 UNIT/ML IV SOLN
500.0000 [IU] | Freq: Once | INTRAVENOUS | Status: AC | PRN
  Administered 2016-06-20: 500 [IU]
  Filled 2016-06-20: qty 5

## 2016-06-20 MED ORDER — DIPHENHYDRAMINE HCL 25 MG PO CAPS
25.0000 mg | ORAL_CAPSULE | Freq: Once | ORAL | Status: AC
  Administered 2016-06-20: 25 mg via ORAL

## 2016-06-20 MED ORDER — INFLUENZA VAC SPLIT QUAD 0.5 ML IM SUSY
0.5000 mL | PREFILLED_SYRINGE | Freq: Once | INTRAMUSCULAR | Status: AC
  Administered 2016-06-20: 0.5 mL via INTRAMUSCULAR
  Filled 2016-06-20: qty 0.5

## 2016-06-20 MED ORDER — DIPHENHYDRAMINE HCL 25 MG PO CAPS
ORAL_CAPSULE | ORAL | Status: AC
  Filled 2016-06-20: qty 2

## 2016-06-20 MED ORDER — ALPRAZOLAM 1 MG PO TABS: 1.0000 mg | ORAL_TABLET | Freq: Three times a day (TID) | ORAL | 2 refills | Status: DC | PRN

## 2016-06-20 MED ORDER — SODIUM CHLORIDE 0.9 % IJ SOLN
10.0000 mL | INTRAMUSCULAR | Status: DC | PRN
  Administered 2016-06-20: 10 mL via INTRAVENOUS
  Filled 2016-06-20: qty 10

## 2016-06-20 MED ORDER — LORAZEPAM 2 MG/ML IJ SOLN
1.0000 mg | Freq: Once | INTRAMUSCULAR | Status: AC | PRN
  Administered 2016-06-20: 1 mg via INTRAVENOUS

## 2016-06-20 MED ORDER — TRASTUZUMAB CHEMO 150 MG IV SOLR
798.0000 mg | Freq: Once | INTRAVENOUS | Status: AC
  Administered 2016-06-20: 798 mg via INTRAVENOUS
  Filled 2016-06-20: qty 38

## 2016-06-20 NOTE — Patient Instructions (Signed)
[facility] Discharge Instructions for Patients Receiving Chemotherapy  Today you received the following chemotherapy agents Herceptin  To help prevent nausea and vomiting after your treatment, we encourage you to take your nausea medication   If you develop nausea and vomiting that is not controlled by your nausea medication, call the clinic.   BELOW ARE SYMPTOMS THAT SHOULD BE REPORTED IMMEDIATELY:  *FEVER GREATER THAN 100.5 F  *CHILLS WITH OR WITHOUT FEVER  NAUSEA AND VOMITING THAT IS NOT CONTROLLED WITH YOUR NAUSEA MEDICATION  *UNUSUAL SHORTNESS OF BREATH  *UNUSUAL BRUISING OR BLEEDING  TENDERNESS IN MOUTH AND THROAT WITH OR WITHOUT PRESENCE OF ULCERS  *URINARY PROBLEMS  *BOWEL PROBLEMS  UNUSUAL RASH Items with * indicate a potential emergency and should be followed up as soon as possible.  Feel free to call the clinic you have any questions or concerns. The clinic phone number is [phone].  Please show the [place] at check-in to the Emergency Department and triage nurse.   Influenza Virus Vaccine injection What is this medicine? INFLUENZA VIRUS VACCINE (in floo EN zuh VAHY ruhs vak SEEN) helps to reduce the risk of getting influenza also known as the flu. The vaccine only helps protect you against some strains of the flu. This medicine may be used for other purposes; ask your health care provider or pharmacist if you have questions. What should I tell my health care provider before I take this medicine? They need to know if you have any of these conditions: -bleeding disorder like hemophilia -fever or infection -Guillain-Barre syndrome or other neurological problems -immune system problems -infection with the human immunodeficiency virus (HIV) or AIDS -low blood platelet counts -multiple sclerosis -an unusual or allergic reaction to influenza virus vaccine, latex, other medicines, foods, dyes, or preservatives. Different brands of  vaccines contain different allergens. Some may contain latex or eggs. Talk to your doctor about your allergies to make sure that you get the right vaccine. -pregnant or trying to get pregnant -breast-feeding How should I use this medicine? This vaccine is for injection into a muscle or under the skin. It is given by a health care professional. A copy of Vaccine Information Statements will be given before each vaccination. Read this sheet carefully each time. The sheet may change frequently. Talk to your healthcare provider to see which vaccines are right for you. Some vaccines should not be used in all age groups. Overdosage: If you think you have taken too much of this medicine contact a poison control center or emergency room at once. NOTE: This medicine is only for you. Do not share this medicine with others. What if I miss a dose? This does not apply. What may interact with this medicine? -chemotherapy or radiation therapy -medicines that lower your immune system like etanercept, anakinra, infliximab, and adalimumab -medicines that treat or prevent blood clots like warfarin -phenytoin -steroid medicines like prednisone or cortisone -theophylline -vaccines This list may not describe all possible interactions. Give your health care provider a list of all the medicines, herbs, non-prescription drugs, or dietary supplements you use. Also tell them if you smoke, drink alcohol, or use illegal drugs. Some items may interact with your medicine. What should I watch for while using this medicine? Report any side effects that do not go away within 3 days to your doctor or health care professional. Call your health care provider if any unusual symptoms occur within 6 weeks of receiving this vaccine. You may still catch the  flu, but the illness is not usually as bad. You cannot get the flu from the vaccine. The vaccine will not protect against colds or other illnesses that may cause fever. The vaccine  is needed every year. What side effects may I notice from receiving this medicine? Side effects that you should report to your doctor or health care professional as soon as possible: -allergic reactions like skin rash, itching or hives, swelling of the face, lips, or tongue Side effects that usually do not require medical attention (report to your doctor or health care professional if they continue or are bothersome): -fever -headache -muscle aches and pains -pain, tenderness, redness, or swelling at the injection site -tiredness This list may not describe all possible side effects. Call your doctor for medical advice about side effects. You may report side effects to FDA at 1-800-FDA-1088. Where should I keep my medicine? The vaccine will be given by a health care professional in a clinic, pharmacy, doctor's office, or other health care setting. You will not be given vaccine doses to store at home. NOTE: This sheet is a summary. It may not cover all possible information. If you have questions about this medicine, talk to your doctor, pharmacist, or health care provider.    2016, Elsevier/Gold Standard. (2015-03-04 10:07:28)

## 2016-06-26 ENCOUNTER — Other Ambulatory Visit: Payer: Self-pay

## 2016-06-26 MED ORDER — WARFARIN SODIUM 5 MG PO TABS: 5.0000 mg | ORAL_TABLET | Freq: Every day | ORAL | 0 refills | Status: DC

## 2016-07-11 ENCOUNTER — Other Ambulatory Visit (HOSPITAL_BASED_OUTPATIENT_CLINIC_OR_DEPARTMENT_OTHER): Payer: Commercial Managed Care - HMO

## 2016-07-11 ENCOUNTER — Ambulatory Visit (HOSPITAL_BASED_OUTPATIENT_CLINIC_OR_DEPARTMENT_OTHER): Payer: Commercial Managed Care - HMO

## 2016-07-11 VITALS — BP 133/79 | HR 59 | Temp 97.2°F | Resp 18

## 2016-07-11 DIAGNOSIS — Z86718 Personal history of other venous thrombosis and embolism: Secondary | ICD-10-CM | POA: Diagnosis not present

## 2016-07-11 DIAGNOSIS — Z5112 Encounter for antineoplastic immunotherapy: Secondary | ICD-10-CM

## 2016-07-11 DIAGNOSIS — C50919 Malignant neoplasm of unspecified site of unspecified female breast: Secondary | ICD-10-CM

## 2016-07-11 DIAGNOSIS — C50912 Malignant neoplasm of unspecified site of left female breast: Secondary | ICD-10-CM

## 2016-07-11 DIAGNOSIS — C50911 Malignant neoplasm of unspecified site of right female breast: Secondary | ICD-10-CM

## 2016-07-11 LAB — CBC WITH DIFFERENTIAL/PLATELET
BASO%: 0.8 % (ref 0.0–2.0)
Basophils Absolute: 0.1 10*3/uL (ref 0.0–0.1)
EOS ABS: 0.2 10*3/uL (ref 0.0–0.5)
EOS%: 2.9 % (ref 0.0–7.0)
HEMATOCRIT: 34.3 % — AB (ref 34.8–46.6)
HEMOGLOBIN: 11.4 g/dL — AB (ref 11.6–15.9)
LYMPH%: 31.9 % (ref 14.0–49.7)
MCH: 27.1 pg (ref 25.1–34.0)
MCHC: 33.3 g/dL (ref 31.5–36.0)
MCV: 81.5 fL (ref 79.5–101.0)
MONO#: 0.7 10*3/uL (ref 0.1–0.9)
MONO%: 8.1 % (ref 0.0–14.0)
NEUT%: 56.3 % (ref 38.4–76.8)
NEUTROS ABS: 4.6 10*3/uL (ref 1.5–6.5)
Platelets: 233 10*3/uL (ref 145–400)
RBC: 4.21 10*6/uL (ref 3.70–5.45)
RDW: 15.4 % — ABNORMAL HIGH (ref 11.2–14.5)
WBC: 8.1 10*3/uL (ref 3.9–10.3)
lymph#: 2.6 10*3/uL (ref 0.9–3.3)

## 2016-07-11 LAB — COMPREHENSIVE METABOLIC PANEL
ALBUMIN: 3.3 g/dL — AB (ref 3.5–5.0)
ALK PHOS: 92 U/L (ref 40–150)
ALT: 8 U/L (ref 0–55)
ANION GAP: 9 meq/L (ref 3–11)
AST: 10 U/L (ref 5–34)
BILIRUBIN TOTAL: 0.69 mg/dL (ref 0.20–1.20)
BUN: 17.6 mg/dL (ref 7.0–26.0)
CO2: 25 mEq/L (ref 22–29)
Calcium: 9.5 mg/dL (ref 8.4–10.4)
Chloride: 106 mEq/L (ref 98–109)
Creatinine: 0.9 mg/dL (ref 0.6–1.1)
EGFR: 81 mL/min/{1.73_m2} — AB (ref >90)
Glucose: 94 mg/dl (ref 70–140)
Potassium: 4 mEq/L (ref 3.5–5.1)
Sodium: 139 mEq/L (ref 136–145)
TOTAL PROTEIN: 8.1 g/dL (ref 6.4–8.3)

## 2016-07-11 LAB — PROTIME-INR
INR: 2.4 (ref 2.00–3.50)
PROTIME: 28.8 s — AB (ref 10.6–13.4)

## 2016-07-11 MED ORDER — SODIUM CHLORIDE 0.9 % IV SOLN
Freq: Once | INTRAVENOUS | Status: AC
  Administered 2016-07-11: 10:00:00 via INTRAVENOUS

## 2016-07-11 MED ORDER — DIPHENHYDRAMINE HCL 25 MG PO CAPS
ORAL_CAPSULE | ORAL | Status: AC
  Filled 2016-07-11: qty 1

## 2016-07-11 MED ORDER — DIPHENHYDRAMINE HCL 25 MG PO CAPS
25.0000 mg | ORAL_CAPSULE | Freq: Once | ORAL | Status: AC
  Administered 2016-07-11: 25 mg via ORAL

## 2016-07-11 MED ORDER — HEPARIN SOD (PORK) LOCK FLUSH 100 UNIT/ML IV SOLN
500.0000 [IU] | Freq: Once | INTRAVENOUS | Status: AC | PRN
  Administered 2016-07-11: 500 [IU]
  Filled 2016-07-11: qty 5

## 2016-07-11 MED ORDER — ACETAMINOPHEN 325 MG PO TABS
ORAL_TABLET | ORAL | Status: AC
  Filled 2016-07-11: qty 2

## 2016-07-11 MED ORDER — LORAZEPAM 2 MG/ML IJ SOLN
INTRAMUSCULAR | Status: AC
  Filled 2016-07-11: qty 1

## 2016-07-11 MED ORDER — SODIUM CHLORIDE 0.9 % IV SOLN
798.0000 mg | Freq: Once | INTRAVENOUS | Status: AC
  Administered 2016-07-11: 798 mg via INTRAVENOUS
  Filled 2016-07-11: qty 38

## 2016-07-11 MED ORDER — LORAZEPAM 2 MG/ML IJ SOLN
1.0000 mg | Freq: Once | INTRAMUSCULAR | Status: AC | PRN
  Administered 2016-07-11: 1 mg via INTRAVENOUS

## 2016-07-11 MED ORDER — ACETAMINOPHEN 325 MG PO TABS
650.0000 mg | ORAL_TABLET | Freq: Once | ORAL | Status: AC
Start: 2016-07-11 — End: 2016-07-11
  Administered 2016-07-11: 650 mg via ORAL

## 2016-07-11 MED ORDER — SODIUM CHLORIDE 0.9 % IJ SOLN
10.0000 mL | INTRAMUSCULAR | Status: DC | PRN
  Administered 2016-07-11: 10 mL
  Filled 2016-07-11: qty 10

## 2016-07-11 NOTE — Patient Instructions (Signed)
[facility] Discharge Instructions for Patients Receiving Chemotherapy  Today you received the following chemotherapy agents: Herceptin   To help prevent nausea and vomiting after your treatment, we encourage you to take your nausea medication as directed.    If you develop nausea and vomiting that is not controlled by your nausea medication, call the clinic.   BELOW ARE SYMPTOMS THAT SHOULD BE REPORTED IMMEDIATELY:  *FEVER GREATER THAN 100.5 F  *CHILLS WITH OR WITHOUT FEVER  NAUSEA AND VOMITING THAT IS NOT CONTROLLED WITH YOUR NAUSEA MEDICATION  *UNUSUAL SHORTNESS OF BREATH  *UNUSUAL BRUISING OR BLEEDING  TENDERNESS IN MOUTH AND THROAT WITH OR WITHOUT PRESENCE OF ULCERS  *URINARY PROBLEMS  *BOWEL PROBLEMS  UNUSUAL RASH Items with * indicate a potential emergency and should be followed up as soon as possible.  Feel free to call the clinic you have any questions or concerns. The clinic phone number is [phone].  Please show the CHEMO ALERT CARD at check-in to the Emergency Department and triage nurse.   

## 2016-08-01 ENCOUNTER — Other Ambulatory Visit (HOSPITAL_BASED_OUTPATIENT_CLINIC_OR_DEPARTMENT_OTHER): Payer: Commercial Managed Care - HMO

## 2016-08-01 ENCOUNTER — Ambulatory Visit: Payer: Commercial Managed Care - HMO

## 2016-08-01 ENCOUNTER — Ambulatory Visit (HOSPITAL_BASED_OUTPATIENT_CLINIC_OR_DEPARTMENT_OTHER): Payer: Commercial Managed Care - HMO

## 2016-08-01 ENCOUNTER — Other Ambulatory Visit: Payer: Self-pay | Admitting: *Deleted

## 2016-08-01 VITALS — BP 129/73 | HR 56 | Temp 98.3°F | Resp 17

## 2016-08-01 DIAGNOSIS — C50912 Malignant neoplasm of unspecified site of left female breast: Secondary | ICD-10-CM

## 2016-08-01 DIAGNOSIS — Z95828 Presence of other vascular implants and grafts: Secondary | ICD-10-CM

## 2016-08-01 DIAGNOSIS — Z86718 Personal history of other venous thrombosis and embolism: Secondary | ICD-10-CM

## 2016-08-01 DIAGNOSIS — C50911 Malignant neoplasm of unspecified site of right female breast: Secondary | ICD-10-CM

## 2016-08-01 DIAGNOSIS — C50919 Malignant neoplasm of unspecified site of unspecified female breast: Secondary | ICD-10-CM

## 2016-08-01 DIAGNOSIS — Z5112 Encounter for antineoplastic immunotherapy: Secondary | ICD-10-CM

## 2016-08-01 LAB — COMPREHENSIVE METABOLIC PANEL
ALBUMIN: 3.3 g/dL — AB (ref 3.5–5.0)
ALK PHOS: 86 U/L (ref 40–150)
ALT: 8 U/L (ref 0–55)
ANION GAP: 8 meq/L (ref 3–11)
AST: 10 U/L (ref 5–34)
BUN: 12.9 mg/dL (ref 7.0–26.0)
CALCIUM: 9.1 mg/dL (ref 8.4–10.4)
CHLORIDE: 108 meq/L (ref 98–109)
CO2: 25 mEq/L (ref 22–29)
CREATININE: 0.8 mg/dL (ref 0.6–1.1)
EGFR: 90 mL/min/{1.73_m2} — ABNORMAL LOW (ref >90)
Glucose: 93 mg/dl (ref 70–140)
POTASSIUM: 4 meq/L (ref 3.5–5.1)
Sodium: 142 mEq/L (ref 136–145)
Total Bilirubin: 0.69 mg/dL (ref 0.20–1.20)
Total Protein: 7.8 g/dL (ref 6.4–8.3)

## 2016-08-01 LAB — CBC WITH DIFFERENTIAL/PLATELET
BASO%: 1 % (ref 0.0–2.0)
BASOS ABS: 0.1 10*3/uL (ref 0.0–0.1)
EOS ABS: 0.2 10*3/uL (ref 0.0–0.5)
EOS%: 2.4 % (ref 0.0–7.0)
HEMATOCRIT: 33.7 % — AB (ref 34.8–46.6)
HEMOGLOBIN: 11.4 g/dL — AB (ref 11.6–15.9)
LYMPH#: 2.7 10*3/uL (ref 0.9–3.3)
LYMPH%: 33 % (ref 14.0–49.7)
MCH: 27.3 pg (ref 25.1–34.0)
MCHC: 34 g/dL (ref 31.5–36.0)
MCV: 80.2 fL (ref 79.5–101.0)
MONO#: 0.6 10*3/uL (ref 0.1–0.9)
MONO%: 7.1 % (ref 0.0–14.0)
NEUT#: 4.6 10*3/uL (ref 1.5–6.5)
NEUT%: 56.5 % (ref 38.4–76.8)
PLATELETS: 240 10*3/uL (ref 145–400)
RBC: 4.2 10*6/uL (ref 3.70–5.45)
RDW: 15.1 % — AB (ref 11.2–14.5)
WBC: 8.1 10*3/uL (ref 3.9–10.3)

## 2016-08-01 LAB — PROTIME-INR
INR: 3 (ref 2.00–3.50)
PROTIME: 36 s — AB (ref 10.6–13.4)

## 2016-08-01 MED ORDER — LORAZEPAM 2 MG/ML IJ SOLN
1.0000 mg | Freq: Once | INTRAMUSCULAR | Status: AC | PRN
  Administered 2016-08-01: 1 mg via INTRAVENOUS

## 2016-08-01 MED ORDER — DIPHENHYDRAMINE HCL 25 MG PO CAPS
25.0000 mg | ORAL_CAPSULE | Freq: Once | ORAL | Status: AC
  Administered 2016-08-01: 25 mg via ORAL

## 2016-08-01 MED ORDER — DIPHENHYDRAMINE HCL 25 MG PO CAPS
ORAL_CAPSULE | ORAL | Status: AC
  Filled 2016-08-01: qty 1

## 2016-08-01 MED ORDER — SODIUM CHLORIDE 0.9 % IJ SOLN
10.0000 mL | INTRAMUSCULAR | Status: DC | PRN
  Administered 2016-08-01: 10 mL
  Filled 2016-08-01: qty 10

## 2016-08-01 MED ORDER — ACETAMINOPHEN 325 MG PO TABS
650.0000 mg | ORAL_TABLET | Freq: Once | ORAL | Status: AC
  Administered 2016-08-01: 650 mg via ORAL

## 2016-08-01 MED ORDER — LORAZEPAM 2 MG/ML IJ SOLN
INTRAMUSCULAR | Status: AC
  Filled 2016-08-01: qty 1

## 2016-08-01 MED ORDER — HEPARIN SOD (PORK) LOCK FLUSH 100 UNIT/ML IV SOLN
500.0000 [IU] | Freq: Once | INTRAVENOUS | Status: AC | PRN
  Administered 2016-08-01: 500 [IU]
  Filled 2016-08-01: qty 5

## 2016-08-01 MED ORDER — ACETAMINOPHEN 325 MG PO TABS
ORAL_TABLET | ORAL | Status: AC
  Filled 2016-08-01: qty 2

## 2016-08-01 MED ORDER — SODIUM CHLORIDE 0.9 % IJ SOLN
10.0000 mL | INTRAMUSCULAR | Status: DC | PRN
  Administered 2016-08-01: 10 mL via INTRAVENOUS
  Filled 2016-08-01: qty 10

## 2016-08-01 MED ORDER — SODIUM CHLORIDE 0.9 % IV SOLN
Freq: Once | INTRAVENOUS | Status: AC
Start: 2016-08-01 — End: 2016-08-01
  Administered 2016-08-01: 11:00:00 via INTRAVENOUS

## 2016-08-01 MED ORDER — TRASTUZUMAB CHEMO 150 MG IV SOLR
798.0000 mg | Freq: Once | INTRAVENOUS | Status: AC
  Administered 2016-08-01: 798 mg via INTRAVENOUS
  Filled 2016-08-01: qty 38

## 2016-08-01 NOTE — Patient Instructions (Signed)
[facility] Discharge Instructions for Patients Receiving Chemotherapy  Today you received the following chemotherapy agents: Herceptin   To help prevent nausea and vomiting after your treatment, we encourage you to take your nausea medication as directed.    If you develop nausea and vomiting that is not controlled by your nausea medication, call the clinic.   BELOW ARE SYMPTOMS THAT SHOULD BE REPORTED IMMEDIATELY:  *FEVER GREATER THAN 100.5 F  *CHILLS WITH OR WITHOUT FEVER  NAUSEA AND VOMITING THAT IS NOT CONTROLLED WITH YOUR NAUSEA MEDICATION  *UNUSUAL SHORTNESS OF BREATH  *UNUSUAL BRUISING OR BLEEDING  TENDERNESS IN MOUTH AND THROAT WITH OR WITHOUT PRESENCE OF ULCERS  *URINARY PROBLEMS  *BOWEL PROBLEMS  UNUSUAL RASH Items with * indicate a potential emergency and should be followed up as soon as possible.  Feel free to call the clinic you have any questions or concerns. The clinic phone number is [phone].  Please show the CHEMO ALERT CARD at check-in to the Emergency Department and triage nurse.   

## 2016-08-06 ENCOUNTER — Ambulatory Visit (HOSPITAL_COMMUNITY)
Admission: RE | Admit: 2016-08-06 | Discharge: 2016-08-06 | Disposition: A | Discharge status: Home / Self Care | Payer: Commercial Managed Care - HMO | Source: Ambulatory Visit | Attending: Internal Medicine | Admitting: Internal Medicine

## 2016-08-06 ENCOUNTER — Encounter (HOSPITAL_COMMUNITY): Payer: Self-pay

## 2016-08-06 ENCOUNTER — Ambulatory Visit (HOSPITAL_BASED_OUTPATIENT_CLINIC_OR_DEPARTMENT_OTHER)
Admission: RE | Admit: 2016-08-06 | Discharge: 2016-08-06 | Disposition: A | Discharge status: Home / Self Care | Payer: Commercial Managed Care - HMO | Source: Ambulatory Visit | Attending: Cardiology | Admitting: Cardiology

## 2016-08-06 ENCOUNTER — Other Ambulatory Visit (HOSPITAL_COMMUNITY): Payer: Self-pay | Admitting: *Deleted

## 2016-08-06 VITALS — BP 134/76 | HR 61 | Wt 298.5 lb

## 2016-08-06 DIAGNOSIS — C50919 Malignant neoplasm of unspecified site of unspecified female breast: Secondary | ICD-10-CM

## 2016-08-06 DIAGNOSIS — I119 Hypertensive heart disease without heart failure: Secondary | ICD-10-CM | POA: Insufficient documentation

## 2016-08-06 DIAGNOSIS — I340 Nonrheumatic mitral (valve) insufficiency: Secondary | ICD-10-CM | POA: Diagnosis not present

## 2016-08-06 DIAGNOSIS — I77810 Thoracic aortic ectasia: Secondary | ICD-10-CM | POA: Diagnosis not present

## 2016-08-06 DIAGNOSIS — Z09 Encounter for follow-up examination after completed treatment for conditions other than malignant neoplasm: Secondary | ICD-10-CM | POA: Insufficient documentation

## 2016-08-06 DIAGNOSIS — I5022 Chronic systolic (congestive) heart failure: Secondary | ICD-10-CM | POA: Diagnosis not present

## 2016-08-06 NOTE — Progress Notes (Signed)
Advanced Heart Failure Clinic Note   Patient ID: [name], female   DOB: [birth year], 65 y.o.   MRN: [id number] Oncologist: Dr Jana Hakim  HPI: [name] is a 65 year old with a history of metastatic HER-2 positive breast carcinoma originally diagnosed September 2004. Started in R breast. Underwent neo-adjuvant chemo. During this time developed L breast CA. Underwent bilateral mastectomy. Lymph nodes +. 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   She received a total of 6 cycles of Taxotere/Carbo/Herceptin, completed in April 2005, after which she began single agent Herceptin, given every 3 weeks. Plan to continue on Herceptin indefinitely.   She has been stable symptomatically.  Takes Lasix rarely.  No exertional dyspnea or chest pain. She does have ankle edema.    Labs 11/15: K 4, creatinine 1.0   Labs 1/16: K 4.1, creatinine 1.0 Labs 8/16: K 4.3, creatinine 0.9 Labs 11/16: K 3.9, creatinine 0.9 Labs 5/17: K 3.9, creatinine 0.68  04/23/12 ECHO EF 60-65% Lateral s' 8.9 cm/s  07/30/12 ECHO EF 60-65% Lateral s' 8.3 cm/s  09/11/12 ECHO EF 60-65% Lateral s' 10.3 cm/s  12/22/12 ECHO 55-60% Lateral S' 9.8 RV mildly dilated  07/01/13 ECHO 55-60%, lateral s' 9.79, mild RV dilation, grade II DD 3/15 ECHO 55%, mild MR, lateral s' 9.6, GLS -19.2% 03/02/14 ECHO EF 55-60% Lateral S' 9.4 GLS - 21.9  11/15 ECHO EF 60-65%, lateral S' 6.8, GLS -17.2%, mild RV dilation with normal systolic function, mild MR.  2/16 ECHO EF 60-65%, lateral S' 14.4, GLS -46.9%, normal diastolic function, mild RV dilation with normal RV systolic function.  8/16 ECHO EF 60-65%, mild LVH, normal RV size and systolic function, lateral s' 13.2, GLS -17%.  11/16 ECHO EF 60-65% Lateral S' 10.2, GLS -20.2% 5/17 ECHO EF 60-65%, mild LVH, lateral s' 12.2, GLS -20.8%, grade II diastolic dysfunction, normal RV. 9/17 ECHO EF 62-95%, normal diastolic function, GLS -28.4% 12/17 ECHO EF  55-60%, moderate diastolic dysfunction, GLS -20.1%, normal RV size and systolic function   ROS: All systems negative except as listed in HPI, PMH and Problem List.  Past Medical History:  Diagnosis Date  . Breast cancer ([place])    mets to liver and lung  . Breast cancer metastasized to multiple sites ([place]) 02/26/2013  . History of chemotherapy Feb. 2006   taxotere/herceptin/carboplatin  . Hypertension   . Neuropathy ([place])   . Radiation 07/31/2006   left upper chest  . Radiation 06/17/2006-06/27/2006   6480 cGy bilat. chest wall  . SVC syndrome   . Thrombosis     Current Outpatient Prescriptions  Medication Sig Dispense Refill  . acetaminophen (TYLENOL) 500 MG tablet Take 1,000 mg by mouth every 6 (six) hours as needed for mild pain or fever.     Marland Kitchen albuterol (PROVENTIL HFA;VENTOLIN HFA) 108 (90 BASE) MCG/ACT inhaler Inhale 2 puffs into the lungs every 6 (six) hours as needed for wheezing. 1 Inhaler 5  . ALPRAZolam (XANAX) 1 MG tablet Take 1 tablet (1 mg total) by mouth 3 (three) times daily as needed. for anxiety 60 tablet 2  . amLODipine (NORVASC) 10 MG tablet Take 10 mg by mouth every morning.    . baclofen (LIORESAL) 10 MG tablet Take 1 tablet (10 mg total) by mouth 3 (three) times daily as needed. for muscle spams 270 tablet 3  . carvedilol (COREG) 3.125 MG tablet Take 1 tablet (3.125 mg total) by mouth 2 (two) times daily. 180 tablet 3  . diclofenac  sodium (VOLTAREN) 1 % GEL Apply 2 g topically daily as needed (for pain). Apply to knees and shoulders 100 g 6  . furosemide (LASIX) 80 MG tablet Take 80 mg by mouth daily as needed for fluid.     Marland Kitchen gabapentin (NEURONTIN) 300 MG capsule TAKE 2 CAPSULES(600 MG) BY MOUTH THREE TIMES DAILY 540 capsule 0  . losartan (COZAAR) 100 MG tablet Take 100 mg by mouth every morning.    . potassium chloride (K-DUR,KLOR-CON) 10 MEQ tablet Take 4 tablets (40 mEq total) by mouth daily as needed. 120 tablet 5  . spironolactone (ALDACTONE) 25 MG tablet Take  1 tablet (25 mg total) by mouth daily. 30 tablet 3  . temazepam (RESTORIL) 30 MG capsule TAKE ONE CAPSULE BY MOUTH EVERY NIGHT AT BEDTIME AS NEEDED FOR SLEEP 30 capsule 2  . venlafaxine XR (EFFEXOR-XR) 37.5 MG 24 hr capsule Take 1 capsule (37.5 mg total) by mouth daily with breakfast. 30 capsule 5  . warfarin (COUMADIN) 5 MG tablet Take 1 tablet (5 mg total) by mouth daily. 90 tablet 0   No current facility-administered medications for this encounter.    Facility-Administered Medications Ordered in Other Encounters  Medication Dose Route Frequency Provider Last Rate Last Dose  . sodium chloride flush (NS) 0.9 % injection 10 mL  10 mL Intravenous PRN Chauncey Cruel, MD   10 mL at 12/15/15 1200    Vitals:   08/06/16 1005  BP: 134/76  Pulse: 61  SpO2: 98%  Weight: 298 lb 8 oz (135.4 kg)    PHYSICAL EXAM: General: NAD, obese. HEENT: normal  Neck: Thick. No JVD difficult to assess d/t body habitus but does not appear elevated; Carotids 2+ bilat; no bruits. No lymphadenopathy or thryomegaly appreciated.  Cor: PMI nondisplaced. Regular rate & rhythm. No rubs, gallops or murmurs. S/P bilateral mastectomies  Lungs: CTA, normal effort Abdomen: obese. soft, NT, ND. +BS  Extremities: no cyanosis, clubbing, rash.  1+ ankle edema.  Neuro: alert & oriented x 3, cranial nerves grossly intact. moves all 4 extremities w/o difficulty. Affect pleasant.  ASSESSMENT & PLAN: 1) L Breast Cancer s/p bilateral mastectomies:  Symptomatically stable.  Echo was reviewed today: EF stable, strain back to prior baseline.  Suspect lower global longitudinal strain on prior echo may have been due to difficult images. She will be continuing Herceptin indefinitely.   - Repeat echo and office followup in 4 months.  2) Suspected OSA: She has not wanted to do a sleep study.    3) HTN: BP is controlled on current regimen.  [name] 08/06/2016

## 2016-08-06 NOTE — Progress Notes (Signed)
  Echocardiogram 2D Echocardiogram has been performed.  [name] M 08/06/2016, 9:49 AM

## 2016-08-22 ENCOUNTER — Ambulatory Visit (HOSPITAL_BASED_OUTPATIENT_CLINIC_OR_DEPARTMENT_OTHER): Payer: Commercial Managed Care - HMO

## 2016-08-22 ENCOUNTER — Ambulatory Visit: Payer: Commercial Managed Care - HMO

## 2016-08-22 ENCOUNTER — Other Ambulatory Visit (HOSPITAL_BASED_OUTPATIENT_CLINIC_OR_DEPARTMENT_OTHER): Payer: Commercial Managed Care - HMO

## 2016-08-22 ENCOUNTER — Other Ambulatory Visit: Payer: Self-pay | Admitting: Hematology

## 2016-08-22 VITALS — BP 147/78 | HR 58 | Temp 98.6°F | Resp 16

## 2016-08-22 DIAGNOSIS — Z86718 Personal history of other venous thrombosis and embolism: Secondary | ICD-10-CM | POA: Diagnosis not present

## 2016-08-22 DIAGNOSIS — C50911 Malignant neoplasm of unspecified site of right female breast: Secondary | ICD-10-CM

## 2016-08-22 DIAGNOSIS — C50912 Malignant neoplasm of unspecified site of left female breast: Secondary | ICD-10-CM

## 2016-08-22 DIAGNOSIS — Z95828 Presence of other vascular implants and grafts: Secondary | ICD-10-CM

## 2016-08-22 DIAGNOSIS — C50919 Malignant neoplasm of unspecified site of unspecified female breast: Secondary | ICD-10-CM

## 2016-08-22 DIAGNOSIS — Z5112 Encounter for antineoplastic immunotherapy: Secondary | ICD-10-CM | POA: Diagnosis not present

## 2016-08-22 LAB — CBC WITH DIFFERENTIAL/PLATELET
BASO%: 0.2 % (ref 0.0–2.0)
BASOS ABS: 0 10*3/uL (ref 0.0–0.1)
EOS ABS: 0.2 10*3/uL (ref 0.0–0.5)
EOS%: 1.9 % (ref 0.0–7.0)
HEMATOCRIT: 32 % — AB (ref 34.8–46.6)
HEMOGLOBIN: 11.2 g/dL — AB (ref 11.6–15.9)
LYMPH#: 2.8 10*3/uL (ref 0.9–3.3)
LYMPH%: 34.4 % (ref 14.0–49.7)
MCH: 27.6 pg (ref 25.1–34.0)
MCHC: 35 g/dL (ref 31.5–36.0)
MCV: 78.8 fL — AB (ref 79.5–101.0)
MONO#: 0.6 10*3/uL (ref 0.1–0.9)
MONO%: 7.5 % (ref 0.0–14.0)
NEUT%: 56 % (ref 38.4–76.8)
NEUTROS ABS: 4.6 10*3/uL (ref 1.5–6.5)
PLATELETS: 259 10*3/uL (ref 145–400)
RBC: 4.06 10*6/uL (ref 3.70–5.45)
RDW: 15.7 % — AB (ref 11.2–14.5)
WBC: 8.3 10*3/uL (ref 3.9–10.3)

## 2016-08-22 LAB — COMPREHENSIVE METABOLIC PANEL
ALK PHOS: 93 U/L (ref 40–150)
ALT: 13 U/L (ref 0–55)
ANION GAP: 8 meq/L (ref 3–11)
AST: 14 U/L (ref 5–34)
Albumin: 3.5 g/dL (ref 3.5–5.0)
BILIRUBIN TOTAL: 0.48 mg/dL (ref 0.20–1.20)
BUN: 15.1 mg/dL (ref 7.0–26.0)
CALCIUM: 9.4 mg/dL (ref 8.4–10.4)
CO2: 25 meq/L (ref 22–29)
CREATININE: 0.8 mg/dL (ref 0.6–1.1)
Chloride: 110 mEq/L — ABNORMAL HIGH (ref 98–109)
EGFR: 87 mL/min/{1.73_m2} — AB (ref >90)
Glucose: 100 mg/dl (ref 70–140)
Potassium: 4.3 mEq/L (ref 3.5–5.1)
Sodium: 142 mEq/L (ref 136–145)
TOTAL PROTEIN: 8 g/dL (ref 6.4–8.3)

## 2016-08-22 LAB — PROTIME-INR
INR: 2.6 (ref 2.00–3.50)
PROTIME: 31.2 s — AB (ref 10.6–13.4)

## 2016-08-22 MED ORDER — SODIUM CHLORIDE 0.9 % IV SOLN
Freq: Once | INTRAVENOUS | Status: AC
  Administered 2016-08-22: 11:00:00 via INTRAVENOUS

## 2016-08-22 MED ORDER — ACETAMINOPHEN 325 MG PO TABS
ORAL_TABLET | ORAL | Status: AC
  Filled 2016-08-22: qty 2

## 2016-08-22 MED ORDER — LORAZEPAM 2 MG/ML IJ SOLN
INTRAMUSCULAR | Status: AC
  Filled 2016-08-22: qty 1

## 2016-08-22 MED ORDER — HEPARIN SOD (PORK) LOCK FLUSH 100 UNIT/ML IV SOLN
500.0000 [IU] | Freq: Once | INTRAVENOUS | Status: AC | PRN
  Administered 2016-08-22: 500 [IU]
  Filled 2016-08-22: qty 5

## 2016-08-22 MED ORDER — ACETAMINOPHEN 325 MG PO TABS
650.0000 mg | ORAL_TABLET | Freq: Once | ORAL | Status: AC
  Administered 2016-08-22: 650 mg via ORAL

## 2016-08-22 MED ORDER — LORAZEPAM 2 MG/ML IJ SOLN
1.0000 mg | Freq: Once | INTRAMUSCULAR | Status: AC | PRN
  Administered 2016-08-22: 1 mg via INTRAVENOUS

## 2016-08-22 MED ORDER — DIPHENHYDRAMINE HCL 25 MG PO CAPS
ORAL_CAPSULE | ORAL | Status: AC
  Filled 2016-08-22: qty 1

## 2016-08-22 MED ORDER — TRASTUZUMAB CHEMO 150 MG IV SOLR
798.0000 mg | Freq: Once | INTRAVENOUS | Status: AC
  Administered 2016-08-22: 798 mg via INTRAVENOUS
  Filled 2016-08-22: qty 38

## 2016-08-22 MED ORDER — SODIUM CHLORIDE 0.9 % IJ SOLN
10.0000 mL | INTRAMUSCULAR | Status: DC | PRN
  Administered 2016-08-22: 10 mL via INTRAVENOUS
  Filled 2016-08-22: qty 10

## 2016-08-22 MED ORDER — DIPHENHYDRAMINE HCL 25 MG PO CAPS
25.0000 mg | ORAL_CAPSULE | Freq: Once | ORAL | Status: AC
  Administered 2016-08-22: 25 mg via ORAL

## 2016-08-22 MED ORDER — SODIUM CHLORIDE 0.9 % IJ SOLN
10.0000 mL | INTRAMUSCULAR | Status: DC | PRN
  Administered 2016-08-22: 10 mL
  Filled 2016-08-22: qty 10

## 2016-08-22 NOTE — Patient Instructions (Signed)
[facility] Discharge Instructions for Patients Receiving Chemotherapy  Today you received the following chemotherapy agents herceptin   To help prevent nausea and vomiting after your treatment, we encourage you to take your nausea medication as directed   If you develop nausea and vomiting that is not controlled by your nausea medication, call the clinic.   BELOW ARE SYMPTOMS THAT SHOULD BE REPORTED IMMEDIATELY:  *FEVER GREATER THAN 100.5 F  *CHILLS WITH OR WITHOUT FEVER  NAUSEA AND VOMITING THAT IS NOT CONTROLLED WITH YOUR NAUSEA MEDICATION  *UNUSUAL SHORTNESS OF BREATH  *UNUSUAL BRUISING OR BLEEDING  TENDERNESS IN MOUTH AND THROAT WITH OR WITHOUT PRESENCE OF ULCERS  *URINARY PROBLEMS  *BOWEL PROBLEMS  UNUSUAL RASH Items with * indicate a potential emergency and should be followed up as soon as possible.  Feel free to call the clinic you have any questions or concerns. The clinic phone number is [phone].  

## 2016-08-31 ENCOUNTER — Other Ambulatory Visit: Payer: Self-pay | Admitting: *Deleted

## 2016-08-31 MED ORDER — GABAPENTIN 300 MG PO CAPS: ORAL_CAPSULE | ORAL | 0 refills | Status: DC

## 2016-09-07 ENCOUNTER — Other Ambulatory Visit (HOSPITAL_COMMUNITY): Payer: Self-pay | Admitting: Internal Medicine

## 2016-09-11 ENCOUNTER — Telehealth: Payer: Self-pay | Admitting: *Deleted

## 2016-09-11 NOTE — Telephone Encounter (Signed)
This RN received VM from pt requesting to reschedule appointment 1/17 due to " I live in [place] and this snow is coming "  Urgent request sent to reschedule.

## 2016-09-12 ENCOUNTER — Ambulatory Visit: Payer: Commercial Managed Care - HMO

## 2016-09-12 ENCOUNTER — Other Ambulatory Visit: Payer: Commercial Managed Care - HMO

## 2016-09-18 ENCOUNTER — Other Ambulatory Visit: Payer: Self-pay | Admitting: Oncology

## 2016-09-19 ENCOUNTER — Other Ambulatory Visit (HOSPITAL_BASED_OUTPATIENT_CLINIC_OR_DEPARTMENT_OTHER): Payer: Medicare Other

## 2016-09-19 ENCOUNTER — Other Ambulatory Visit: Payer: Self-pay | Admitting: Emergency Medicine

## 2016-09-19 ENCOUNTER — Ambulatory Visit (HOSPITAL_BASED_OUTPATIENT_CLINIC_OR_DEPARTMENT_OTHER): Payer: Medicare Other

## 2016-09-19 DIAGNOSIS — Z86718 Personal history of other venous thrombosis and embolism: Secondary | ICD-10-CM

## 2016-09-19 DIAGNOSIS — C50912 Malignant neoplasm of unspecified site of left female breast: Secondary | ICD-10-CM | POA: Diagnosis not present

## 2016-09-19 DIAGNOSIS — Z5112 Encounter for antineoplastic immunotherapy: Secondary | ICD-10-CM | POA: Diagnosis not present

## 2016-09-19 DIAGNOSIS — C50919 Malignant neoplasm of unspecified site of unspecified female breast: Secondary | ICD-10-CM

## 2016-09-19 DIAGNOSIS — Z95828 Presence of other vascular implants and grafts: Secondary | ICD-10-CM

## 2016-09-19 DIAGNOSIS — C50911 Malignant neoplasm of unspecified site of right female breast: Secondary | ICD-10-CM

## 2016-09-19 LAB — CBC WITH DIFFERENTIAL/PLATELET
BASO%: 0.6 % (ref 0.0–2.0)
BASOS ABS: 0.1 10*3/uL (ref 0.0–0.1)
EOS ABS: 0.2 10*3/uL (ref 0.0–0.5)
EOS%: 2.7 % (ref 0.0–7.0)
HEMATOCRIT: 33.8 % — AB (ref 34.8–46.6)
HEMOGLOBIN: 11.4 g/dL — AB (ref 11.6–15.9)
LYMPH#: 2.7 10*3/uL (ref 0.9–3.3)
LYMPH%: 30.2 % (ref 14.0–49.7)
MCH: 27.8 pg (ref 25.1–34.0)
MCHC: 33.8 g/dL (ref 31.5–36.0)
MCV: 82.1 fL (ref 79.5–101.0)
MONO#: 0.7 10*3/uL (ref 0.1–0.9)
MONO%: 7.5 % (ref 0.0–14.0)
NEUT#: 5.3 10*3/uL (ref 1.5–6.5)
NEUT%: 59 % (ref 38.4–76.8)
Platelets: 230 10*3/uL (ref 145–400)
RBC: 4.11 10*6/uL (ref 3.70–5.45)
RDW: 16.4 % — AB (ref 11.2–14.5)
WBC: 9 10*3/uL (ref 3.9–10.3)

## 2016-09-19 LAB — COMPREHENSIVE METABOLIC PANEL
ALBUMIN: 3.4 g/dL — AB (ref 3.5–5.0)
ALK PHOS: 100 U/L (ref 40–150)
ALT: 12 U/L (ref 0–55)
ANION GAP: 9 meq/L (ref 3–11)
AST: 16 U/L (ref 5–34)
BUN: 19.5 mg/dL (ref 7.0–26.0)
CALCIUM: 9.3 mg/dL (ref 8.4–10.4)
CO2: 26 mEq/L (ref 22–29)
Chloride: 106 mEq/L (ref 98–109)
Creatinine: 0.9 mg/dL (ref 0.6–1.1)
EGFR: 81 mL/min/{1.73_m2} — AB (ref >90)
Glucose: 97 mg/dl (ref 70–140)
POTASSIUM: 4.2 meq/L (ref 3.5–5.1)
Sodium: 141 mEq/L (ref 136–145)
Total Bilirubin: 0.84 mg/dL (ref 0.20–1.20)
Total Protein: 7.9 g/dL (ref 6.4–8.3)

## 2016-09-19 LAB — PROTIME-INR
INR: 2.7 (ref 2.00–3.50)
PROTIME: 32.4 s — AB (ref 10.6–13.4)

## 2016-09-19 MED ORDER — DIPHENHYDRAMINE HCL 25 MG PO CAPS
25.0000 mg | ORAL_CAPSULE | Freq: Once | ORAL | Status: AC
  Administered 2016-09-19: 25 mg via ORAL

## 2016-09-19 MED ORDER — DIPHENHYDRAMINE HCL 25 MG PO CAPS
ORAL_CAPSULE | ORAL | Status: AC
  Filled 2016-09-19: qty 1

## 2016-09-19 MED ORDER — ALPRAZOLAM 1 MG PO TABS: 1.0000 mg | ORAL_TABLET | Freq: Three times a day (TID) | ORAL | 2 refills | Status: DC | PRN

## 2016-09-19 MED ORDER — ACETAMINOPHEN 325 MG PO TABS
650.0000 mg | ORAL_TABLET | Freq: Once | ORAL | Status: AC
  Administered 2016-09-19: 650 mg via ORAL

## 2016-09-19 MED ORDER — TRASTUZUMAB CHEMO 150 MG IV SOLR
798.0000 mg | Freq: Once | INTRAVENOUS | Status: AC
  Administered 2016-09-19: 798 mg via INTRAVENOUS
  Filled 2016-09-19: qty 38

## 2016-09-19 MED ORDER — LORAZEPAM 2 MG/ML IJ SOLN
INTRAMUSCULAR | Status: AC
  Filled 2016-09-19: qty 1

## 2016-09-19 MED ORDER — SODIUM CHLORIDE 0.9 % IJ SOLN
10.0000 mL | INTRAMUSCULAR | Status: DC | PRN
  Filled 2016-09-19: qty 10

## 2016-09-19 MED ORDER — TEMAZEPAM 30 MG PO CAPS: ORAL_CAPSULE | ORAL | 2 refills | Status: DC

## 2016-09-19 MED ORDER — SODIUM CHLORIDE 0.9 % IV SOLN
Freq: Once | INTRAVENOUS | Status: AC
  Administered 2016-09-19: 10:00:00 via INTRAVENOUS

## 2016-09-19 MED ORDER — SODIUM CHLORIDE 0.9 % IJ SOLN
10.0000 mL | INTRAMUSCULAR | Status: DC | PRN
  Administered 2016-09-19 (×2): 10 mL
  Filled 2016-09-19: qty 10

## 2016-09-19 MED ORDER — HEPARIN SOD (PORK) LOCK FLUSH 100 UNIT/ML IV SOLN
500.0000 [IU] | Freq: Once | INTRAVENOUS | Status: AC | PRN
  Administered 2016-09-19: 500 [IU]
  Filled 2016-09-19: qty 5

## 2016-09-19 MED ORDER — ACETAMINOPHEN 325 MG PO TABS
ORAL_TABLET | ORAL | Status: AC
  Filled 2016-09-19: qty 2

## 2016-09-19 MED ORDER — LORAZEPAM 2 MG/ML IJ SOLN
1.0000 mg | Freq: Once | INTRAMUSCULAR | Status: AC | PRN
  Administered 2016-09-19: 1 mg via INTRAVENOUS

## 2016-09-19 NOTE — Patient Instructions (Signed)
Trastuzumab injection for infusion What is this medicine? TRASTUZUMAB (tras TOO zoo mab) is a monoclonal antibody. It is used to treat breast cancer and stomach cancer. This medicine may be used for other purposes; ask your health care provider or pharmacist if you have questions. COMMON BRAND NAME(S): Herceptin What should I tell my health care provider before I take this medicine? They need to know if you have any of these conditions: -heart disease -heart failure -infection (especially a virus infection such as chickenpox, cold sores, or herpes) -lung or breathing disease, like asthma -recent or ongoing radiation therapy -an unusual or allergic reaction to trastuzumab, benzyl alcohol, or other medications, foods, dyes, or preservatives -pregnant or trying to get pregnant -breast-feeding How should I use this medicine? This drug is given as an infusion into a vein. It is administered in a hospital or clinic by a specially trained health care professional. Talk to your pediatrician regarding the use of this medicine in children. This medicine is not approved for use in children. Overdosage: If you think you have taken too much of this medicine contact a poison control center or emergency room at once. NOTE: This medicine is only for you. Do not share this medicine with others. What if I miss a dose? It is important not to miss a dose. Call your doctor or health care professional if you are unable to keep an appointment. What may interact with this medicine? -doxorubicin -warfarin This list may not describe all possible interactions. Give your health care provider a list of all the medicines, herbs, non-prescription drugs, or dietary supplements you use. Also tell them if you smoke, drink alcohol, or use illegal drugs. Some items may interact with your medicine. What should I watch for while using this medicine? Visit your doctor for checks on your progress. Report any side effects.  Continue your course of treatment even though you feel ill unless your doctor tells you to stop. Call your doctor or health care professional for advice if you get a fever, chills or sore throat, or other symptoms of a cold or flu. Do not treat yourself. Try to avoid being around people who are sick. You may experience fever, chills and shaking during your first infusion. These effects are usually mild and can be treated with other medicines. Report any side effects during the infusion to your health care professional. Fever and chills usually do not happen with later infusions. Do not become pregnant while taking this medicine or for 7 months after stopping it. Women should inform their doctor if they wish to become pregnant or think they might be pregnant. Women of child-bearing potential will need to have a negative pregnancy test before starting this medicine. There is a potential for serious side effects to an unborn child. Talk to your health care professional or pharmacist for more information. Do not breast-feed an infant while taking this medicine or for 7 months after stopping it. Women must use effective birth control with this medicine. What side effects may I notice from receiving this medicine? Side effects that you should report to your doctor or health care professional as soon as possible: -breathing difficulties -chest pain or palpitations -cough -dizziness or fainting -fever or chills, sore throat -skin rash, itching or hives -swelling of the legs or ankles -unusually weak or tired Side effects that usually do not require medical attention (report to your doctor or health care professional if they continue or are bothersome): -loss of appetite -headache -muscle aches -  nausea This list may not describe all possible side effects. Call your doctor for medical advice about side effects. You may report side effects to FDA at 1-800-FDA-1088. Where should I keep my medicine? This  drug is given in a hospital or clinic and will not be stored at home. NOTE: This sheet is a summary. It may not cover all possible information. If you have questions about this medicine, talk to your doctor, pharmacist, or health care provider.  2017 Elsevier/Gold Standard (2015-09-14 17:16:44)  

## 2016-09-28 ENCOUNTER — Other Ambulatory Visit: Payer: Self-pay | Admitting: *Deleted

## 2016-09-28 MED ORDER — GABAPENTIN 300 MG PO CAPS: ORAL_CAPSULE | ORAL | 0 refills | Status: DC

## 2016-09-28 MED ORDER — WARFARIN SODIUM 5 MG PO TABS
5.0000 mg | ORAL_TABLET | Freq: Every day | ORAL | 0 refills | Status: DC
Start: 2016-09-28 — End: 2016-12-24

## 2016-10-03 ENCOUNTER — Other Ambulatory Visit (HOSPITAL_BASED_OUTPATIENT_CLINIC_OR_DEPARTMENT_OTHER): Payer: Medicare Other

## 2016-10-03 ENCOUNTER — Ambulatory Visit (HOSPITAL_BASED_OUTPATIENT_CLINIC_OR_DEPARTMENT_OTHER): Payer: Medicare Other

## 2016-10-03 ENCOUNTER — Telehealth: Payer: Self-pay | Admitting: Adult Health

## 2016-10-03 ENCOUNTER — Ambulatory Visit: Payer: Medicare Other

## 2016-10-03 VITALS — BP 124/57 | HR 60 | Temp 98.9°F | Resp 18

## 2016-10-03 DIAGNOSIS — Z95828 Presence of other vascular implants and grafts: Secondary | ICD-10-CM

## 2016-10-03 DIAGNOSIS — Z5112 Encounter for antineoplastic immunotherapy: Secondary | ICD-10-CM

## 2016-10-03 DIAGNOSIS — C50912 Malignant neoplasm of unspecified site of left female breast: Secondary | ICD-10-CM

## 2016-10-03 DIAGNOSIS — C50911 Malignant neoplasm of unspecified site of right female breast: Secondary | ICD-10-CM

## 2016-10-03 DIAGNOSIS — Z86718 Personal history of other venous thrombosis and embolism: Secondary | ICD-10-CM

## 2016-10-03 DIAGNOSIS — C50919 Malignant neoplasm of unspecified site of unspecified female breast: Secondary | ICD-10-CM

## 2016-10-03 LAB — COMPREHENSIVE METABOLIC PANEL
ALT: 11 U/L (ref 0–55)
AST: 13 U/L (ref 5–34)
Albumin: 3.5 g/dL (ref 3.5–5.0)
Alkaline Phosphatase: 86 U/L (ref 40–150)
Anion Gap: 8 mEq/L (ref 3–11)
BUN: 16.8 mg/dL (ref 7.0–26.0)
CHLORIDE: 107 meq/L (ref 98–109)
CO2: 25 mEq/L (ref 22–29)
Calcium: 9.5 mg/dL (ref 8.4–10.4)
Creatinine: 0.8 mg/dL (ref 0.6–1.1)
EGFR: >90 mL/min/{1.73_m2} (ref >90)
GLUCOSE: 96 mg/dL (ref 70–140)
POTASSIUM: 4.2 meq/L (ref 3.5–5.1)
SODIUM: 140 meq/L (ref 136–145)
Total Bilirubin: 0.61 mg/dL (ref 0.20–1.20)
Total Protein: 7.9 g/dL (ref 6.4–8.3)

## 2016-10-03 LAB — CBC WITH DIFFERENTIAL/PLATELET
BASO%: 0.3 % (ref 0.0–2.0)
BASOS ABS: 0 10*3/uL (ref 0.0–0.1)
EOS%: 1.8 % (ref 0.0–7.0)
Eosinophils Absolute: 0.1 10*3/uL (ref 0.0–0.5)
HCT: 34.3 % — ABNORMAL LOW (ref 34.8–46.6)
HGB: 11.6 g/dL (ref 11.6–15.9)
LYMPH%: 29.9 % (ref 14.0–49.7)
MCH: 27.6 pg (ref 25.1–34.0)
MCHC: 33.8 g/dL (ref 31.5–36.0)
MCV: 81.7 fL (ref 79.5–101.0)
MONO#: 0.5 10*3/uL (ref 0.1–0.9)
MONO%: 7.1 % (ref 0.0–14.0)
NEUT#: 4.5 10*3/uL (ref 1.5–6.5)
NEUT%: 60.9 % (ref 38.4–76.8)
Platelets: 229 10*3/uL (ref 145–400)
RBC: 4.2 10*6/uL (ref 3.70–5.45)
RDW: 16.1 % — AB (ref 11.2–14.5)
WBC: 7.3 10*3/uL (ref 3.9–10.3)
lymph#: 2.2 10*3/uL (ref 0.9–3.3)

## 2016-10-03 LAB — PROTIME-INR
INR: 1.7 — ABNORMAL LOW (ref 2.00–3.50)
Protime: 20.4 Seconds — ABNORMAL HIGH (ref 10.6–13.4)

## 2016-10-03 MED ORDER — DIPHENHYDRAMINE HCL 25 MG PO CAPS
25.0000 mg | ORAL_CAPSULE | Freq: Once | ORAL | Status: AC
  Administered 2016-10-03: 25 mg via ORAL

## 2016-10-03 MED ORDER — TRASTUZUMAB CHEMO 150 MG IV SOLR
798.0000 mg | Freq: Once | INTRAVENOUS | Status: AC
  Administered 2016-10-03: 798 mg via INTRAVENOUS
  Filled 2016-10-03: qty 38

## 2016-10-03 MED ORDER — SODIUM CHLORIDE 0.9 % IJ SOLN
10.0000 mL | INTRAMUSCULAR | Status: DC | PRN
  Administered 2016-10-03: 10 mL via INTRAVENOUS
  Filled 2016-10-03: qty 10

## 2016-10-03 MED ORDER — ACETAMINOPHEN 325 MG PO TABS
ORAL_TABLET | ORAL | Status: AC
  Filled 2016-10-03: qty 2

## 2016-10-03 MED ORDER — LORAZEPAM 2 MG/ML IJ SOLN
1.0000 mg | Freq: Once | INTRAMUSCULAR | Status: AC | PRN
  Administered 2016-10-03: 1 mg via INTRAVENOUS

## 2016-10-03 MED ORDER — HEPARIN SOD (PORK) LOCK FLUSH 100 UNIT/ML IV SOLN
500.0000 [IU] | Freq: Once | INTRAVENOUS | Status: AC | PRN
  Administered 2016-10-03: 500 [IU]
  Filled 2016-10-03: qty 5

## 2016-10-03 MED ORDER — LORAZEPAM 2 MG/ML IJ SOLN
INTRAMUSCULAR | Status: AC
  Filled 2016-10-03: qty 1

## 2016-10-03 MED ORDER — DIPHENHYDRAMINE HCL 25 MG PO CAPS
ORAL_CAPSULE | ORAL | Status: AC
  Filled 2016-10-03: qty 1

## 2016-10-03 MED ORDER — ACETAMINOPHEN 325 MG PO TABS
650.0000 mg | ORAL_TABLET | Freq: Once | ORAL | Status: AC
  Administered 2016-10-03: 650 mg via ORAL

## 2016-10-03 MED ORDER — SODIUM CHLORIDE 0.9 % IV SOLN
Freq: Once | INTRAVENOUS | Status: AC
  Administered 2016-10-03: 11:00:00 via INTRAVENOUS

## 2016-10-03 MED ORDER — SODIUM CHLORIDE 0.9 % IJ SOLN
10.0000 mL | INTRAMUSCULAR | Status: DC | PRN
  Administered 2016-10-03: 10 mL
  Filled 2016-10-03: qty 10

## 2016-10-03 NOTE — Telephone Encounter (Signed)
Patient is taking Coumadin 5mg  per day.  I called her to give her INR results which were 1.7.  She tells me that she has had an enormous increase in her green leafy vegetables and she is, "ashamed to say how much."  I counseled her on going back to however she was eating her green leafy vegetables when her levels were normal.  Patient verbalized understanding.  To call with concerns.  Recheck due in 3 weeks.   Charlestine Massed, NP

## 2016-10-03 NOTE — Patient Instructions (Signed)
[facility] Discharge Instructions for Patients Receiving Chemotherapy  Today you received the following chemotherapy agents herceptin   To help prevent nausea and vomiting after your treatment, we encourage you to take your nausea medication as directed   If you develop nausea and vomiting that is not controlled by your nausea medication, call the clinic.   BELOW ARE SYMPTOMS THAT SHOULD BE REPORTED IMMEDIATELY:  *FEVER GREATER THAN 100.5 F  *CHILLS WITH OR WITHOUT FEVER  NAUSEA AND VOMITING THAT IS NOT CONTROLLED WITH YOUR NAUSEA MEDICATION  *UNUSUAL SHORTNESS OF BREATH  *UNUSUAL BRUISING OR BLEEDING  TENDERNESS IN MOUTH AND THROAT WITH OR WITHOUT PRESENCE OF ULCERS  *URINARY PROBLEMS  *BOWEL PROBLEMS  UNUSUAL RASH Items with * indicate a potential emergency and should be followed up as soon as possible.  Feel free to call the clinic you have any questions or concerns. The clinic phone number is [phone].  

## 2016-10-11 ENCOUNTER — Other Ambulatory Visit (HOSPITAL_COMMUNITY): Payer: Self-pay | Admitting: Internal Medicine

## 2016-10-23 ENCOUNTER — Other Ambulatory Visit (HOSPITAL_COMMUNITY): Payer: Self-pay | Admitting: Internal Medicine

## 2016-10-24 ENCOUNTER — Other Ambulatory Visit (HOSPITAL_BASED_OUTPATIENT_CLINIC_OR_DEPARTMENT_OTHER): Payer: Medicare Other

## 2016-10-24 ENCOUNTER — Telehealth: Payer: Self-pay | Admitting: Emergency Medicine

## 2016-10-24 ENCOUNTER — Other Ambulatory Visit (HOSPITAL_COMMUNITY)
Admission: AD | Admit: 2016-10-24 | Discharge: 2016-10-24 | Disposition: A | Discharge status: Home / Self Care | Payer: Medicare Other | Source: Ambulatory Visit | Attending: Oncology | Admitting: Oncology

## 2016-10-24 ENCOUNTER — Encounter: Payer: Self-pay | Admitting: *Deleted

## 2016-10-24 ENCOUNTER — Ambulatory Visit: Payer: Medicare Other

## 2016-10-24 ENCOUNTER — Ambulatory Visit (HOSPITAL_BASED_OUTPATIENT_CLINIC_OR_DEPARTMENT_OTHER): Payer: Medicare Other

## 2016-10-24 VITALS — BP 147/95 | HR 68 | Temp 98.3°F | Resp 16

## 2016-10-24 DIAGNOSIS — Z5112 Encounter for antineoplastic immunotherapy: Secondary | ICD-10-CM

## 2016-10-24 DIAGNOSIS — C50912 Malignant neoplasm of unspecified site of left female breast: Secondary | ICD-10-CM

## 2016-10-24 DIAGNOSIS — C50919 Malignant neoplasm of unspecified site of unspecified female breast: Secondary | ICD-10-CM | POA: Diagnosis not present

## 2016-10-24 DIAGNOSIS — C50911 Malignant neoplasm of unspecified site of right female breast: Secondary | ICD-10-CM

## 2016-10-24 DIAGNOSIS — Z95828 Presence of other vascular implants and grafts: Secondary | ICD-10-CM

## 2016-10-24 LAB — PROTIME-INR
INR: 4.44 — AB
PROTHROMBIN TIME: 43.5 s — AB (ref 11.4–15.2)

## 2016-10-24 LAB — CBC WITH DIFFERENTIAL/PLATELET
BASO%: 0.3 % (ref 0.0–2.0)
BASOS ABS: 0 10*3/uL (ref 0.0–0.1)
EOS ABS: 0.2 10*3/uL (ref 0.0–0.5)
EOS%: 2.8 % (ref 0.0–7.0)
HCT: 33.1 % — ABNORMAL LOW (ref 34.8–46.6)
HEMOGLOBIN: 11.3 g/dL — AB (ref 11.6–15.9)
LYMPH#: 2.9 10*3/uL (ref 0.9–3.3)
LYMPH%: 38.7 % (ref 14.0–49.7)
MCH: 27.1 pg (ref 25.1–34.0)
MCHC: 34.1 g/dL (ref 31.5–36.0)
MCV: 79.4 fL — AB (ref 79.5–101.0)
MONO#: 0.7 10*3/uL (ref 0.1–0.9)
MONO%: 9.1 % (ref 0.0–14.0)
NEUT#: 3.7 10*3/uL (ref 1.5–6.5)
NEUT%: 49.1 % (ref 38.4–76.8)
PLATELETS: 233 10*3/uL (ref 145–400)
RBC: 4.17 10*6/uL (ref 3.70–5.45)
RDW: 16 % — AB (ref 11.2–14.5)
WBC: 7.5 10*3/uL (ref 3.9–10.3)

## 2016-10-24 LAB — COMPREHENSIVE METABOLIC PANEL
ALBUMIN: 3.7 g/dL (ref 3.5–5.0)
ALT: 13 U/L (ref 0–55)
AST: 14 U/L (ref 5–34)
Alkaline Phosphatase: 88 U/L (ref 40–150)
Anion Gap: 8 mEq/L (ref 3–11)
BUN: 28.4 mg/dL — AB (ref 7.0–26.0)
CO2: 27 mEq/L (ref 22–29)
Calcium: 9.6 mg/dL (ref 8.4–10.4)
Chloride: 107 mEq/L (ref 98–109)
Creatinine: 1 mg/dL (ref 0.6–1.1)
EGFR: 67 mL/min/{1.73_m2} — ABNORMAL LOW (ref >90)
Glucose: 92 mg/dl (ref 70–140)
POTASSIUM: 4.2 meq/L (ref 3.5–5.1)
Sodium: 143 mEq/L (ref 136–145)
Total Bilirubin: 0.66 mg/dL (ref 0.20–1.20)
Total Protein: 8.1 g/dL (ref 6.4–8.3)

## 2016-10-24 MED ORDER — LORAZEPAM 2 MG/ML IJ SOLN
1.0000 mg | Freq: Once | INTRAMUSCULAR | Status: AC | PRN
  Administered 2016-10-24: 1 mg via INTRAVENOUS

## 2016-10-24 MED ORDER — ACETAMINOPHEN 325 MG PO TABS
650.0000 mg | ORAL_TABLET | Freq: Once | ORAL | Status: AC
  Administered 2016-10-24: 650 mg via ORAL

## 2016-10-24 MED ORDER — SODIUM CHLORIDE 0.9 % IJ SOLN
10.0000 mL | INTRAMUSCULAR | Status: DC | PRN
  Administered 2016-10-24: 10 mL
  Filled 2016-10-24: qty 10

## 2016-10-24 MED ORDER — ACETAMINOPHEN 325 MG PO TABS
ORAL_TABLET | ORAL | Status: AC
  Filled 2016-10-24: qty 2

## 2016-10-24 MED ORDER — HEPARIN SOD (PORK) LOCK FLUSH 100 UNIT/ML IV SOLN
500.0000 [IU] | Freq: Once | INTRAVENOUS | Status: AC | PRN
  Administered 2016-10-24: 500 [IU]
  Filled 2016-10-24: qty 5

## 2016-10-24 MED ORDER — SODIUM CHLORIDE 0.9 % IJ SOLN
10.0000 mL | INTRAMUSCULAR | Status: DC | PRN
  Administered 2016-10-24: 10 mL via INTRAVENOUS
  Filled 2016-10-24: qty 10

## 2016-10-24 MED ORDER — DIPHENHYDRAMINE HCL 25 MG PO CAPS
ORAL_CAPSULE | ORAL | Status: AC
  Filled 2016-10-24: qty 2

## 2016-10-24 MED ORDER — DIPHENHYDRAMINE HCL 25 MG PO CAPS
25.0000 mg | ORAL_CAPSULE | Freq: Once | ORAL | Status: AC
  Administered 2016-10-24: 25 mg via ORAL

## 2016-10-24 MED ORDER — SODIUM CHLORIDE 0.9 % IV SOLN
798.0000 mg | Freq: Once | INTRAVENOUS | Status: AC
  Administered 2016-10-24: 798 mg via INTRAVENOUS
  Filled 2016-10-24: qty 38

## 2016-10-24 MED ORDER — SODIUM CHLORIDE 0.9 % IV SOLN
Freq: Once | INTRAVENOUS | Status: AC
  Administered 2016-10-24: 11:00:00 via INTRAVENOUS

## 2016-10-24 MED ORDER — LORAZEPAM 2 MG/ML IJ SOLN
INTRAMUSCULAR | Status: AC
Start: 2016-10-24 — End: 2016-10-24
  Filled 2016-10-24: qty 1

## 2016-10-24 NOTE — Progress Notes (Unsigned)
Critical INR given to [name], [place] at 1153 by DCM.

## 2016-10-24 NOTE — Patient Instructions (Signed)
[facility] Discharge Instructions for Patients Receiving Chemotherapy  Today you received the following chemotherapy agents: Herceptin   To help prevent nausea and vomiting after your treatment, we encourage you to take your nausea medication as directed.    If you develop nausea and vomiting that is not controlled by your nausea medication, call the clinic.   BELOW ARE SYMPTOMS THAT SHOULD BE REPORTED IMMEDIATELY:  *FEVER GREATER THAN 100.5 F  *CHILLS WITH OR WITHOUT FEVER  NAUSEA AND VOMITING THAT IS NOT CONTROLLED WITH YOUR NAUSEA MEDICATION  *UNUSUAL SHORTNESS OF BREATH  *UNUSUAL BRUISING OR BLEEDING  TENDERNESS IN MOUTH AND THROAT WITH OR WITHOUT PRESENCE OF ULCERS  *URINARY PROBLEMS  *BOWEL PROBLEMS  UNUSUAL RASH Items with * indicate a potential emergency and should be followed up as soon as possible.  Feel free to call the clinic you have any questions or concerns. The clinic phone number is [phone].  Please show the CHEMO ALERT CARD at check-in to the Emergency Department and triage nurse.   

## 2016-10-24 NOTE — Telephone Encounter (Signed)
Per patient she has only ate "greens" twice within the last 3 weeks. Per Dr Jana Hakim; instructed patient to hold Coumadin today and tomorrow 2/28 and 3/1; restart on 3/2 at half a tablet (2.5mg  total) then on 3/3 take a whole tablet 5mg s total; then alternate 2.5mg s and 5mg s until she returns on 3/7 for repeat labs. Patient verbalized understanding and advised to call with any questions or concerns. Advised patient to be consistent in Vitamin K consumption as well.

## 2016-10-31 ENCOUNTER — Other Ambulatory Visit (HOSPITAL_BASED_OUTPATIENT_CLINIC_OR_DEPARTMENT_OTHER): Payer: Medicare Other

## 2016-10-31 DIAGNOSIS — C50911 Malignant neoplasm of unspecified site of right female breast: Secondary | ICD-10-CM | POA: Diagnosis present

## 2016-10-31 DIAGNOSIS — C50912 Malignant neoplasm of unspecified site of left female breast: Secondary | ICD-10-CM | POA: Diagnosis not present

## 2016-10-31 DIAGNOSIS — Z86718 Personal history of other venous thrombosis and embolism: Secondary | ICD-10-CM | POA: Diagnosis not present

## 2016-10-31 LAB — CBC WITH DIFFERENTIAL/PLATELET
BASO%: 0.3 % (ref 0.0–2.0)
Basophils Absolute: 0 10*3/uL (ref 0.0–0.1)
EOS%: 1.8 % (ref 0.0–7.0)
Eosinophils Absolute: 0.1 10*3/uL (ref 0.0–0.5)
HCT: 36.4 % (ref 34.8–46.6)
HEMOGLOBIN: 12.8 g/dL (ref 11.6–15.9)
LYMPH#: 2.4 10*3/uL (ref 0.9–3.3)
LYMPH%: 29.8 % (ref 14.0–49.7)
MCH: 27.9 pg (ref 25.1–34.0)
MCHC: 35.2 g/dL (ref 31.5–36.0)
MCV: 79.3 fL — ABNORMAL LOW (ref 79.5–101.0)
MONO#: 0.6 10*3/uL (ref 0.1–0.9)
MONO%: 8 % (ref 0.0–14.0)
NEUT%: 60.1 % (ref 38.4–76.8)
NEUTROS ABS: 4.8 10*3/uL (ref 1.5–6.5)
Platelets: 247 10*3/uL (ref 145–400)
RBC: 4.59 10*6/uL (ref 3.70–5.45)
RDW: 15.7 % — AB (ref 11.2–14.5)
WBC: 8 10*3/uL (ref 3.9–10.3)
nRBC: 0 % (ref 0–0)

## 2016-10-31 LAB — PROTIME-INR
INR: 2 (ref 2.00–3.50)
PROTIME: 24 s — AB (ref 10.6–13.4)

## 2016-11-01 ENCOUNTER — Telehealth: Payer: Self-pay | Admitting: Oncology

## 2016-11-01 NOTE — Telephone Encounter (Signed)
Called and spoke with patient and confirmed appointment change

## 2016-11-06 DIAGNOSIS — I10 Essential (primary) hypertension: Secondary | ICD-10-CM | POA: Diagnosis not present

## 2016-11-06 DIAGNOSIS — M2550 Pain in unspecified joint: Secondary | ICD-10-CM | POA: Diagnosis not present

## 2016-11-06 DIAGNOSIS — G629 Polyneuropathy, unspecified: Secondary | ICD-10-CM | POA: Diagnosis not present

## 2016-11-06 DIAGNOSIS — M7989 Other specified soft tissue disorders: Secondary | ICD-10-CM | POA: Diagnosis not present

## 2016-11-06 DIAGNOSIS — M170 Bilateral primary osteoarthritis of knee: Secondary | ICD-10-CM | POA: Diagnosis not present

## 2016-11-14 ENCOUNTER — Ambulatory Visit (HOSPITAL_BASED_OUTPATIENT_CLINIC_OR_DEPARTMENT_OTHER): Payer: Medicare Other

## 2016-11-14 ENCOUNTER — Ambulatory Visit: Payer: Medicare Other

## 2016-11-14 ENCOUNTER — Other Ambulatory Visit (HOSPITAL_BASED_OUTPATIENT_CLINIC_OR_DEPARTMENT_OTHER): Payer: Medicare Other

## 2016-11-14 ENCOUNTER — Ambulatory Visit (HOSPITAL_BASED_OUTPATIENT_CLINIC_OR_DEPARTMENT_OTHER): Payer: Medicare Other | Admitting: Oncology

## 2016-11-14 VITALS — BP 154/67 | HR 91 | Temp 98.7°F | Resp 18 | Ht 63.0 in | Wt 301.3 lb

## 2016-11-14 DIAGNOSIS — C50912 Malignant neoplasm of unspecified site of left female breast: Secondary | ICD-10-CM

## 2016-11-14 DIAGNOSIS — Z170 Estrogen receptor positive status [ER+]: Secondary | ICD-10-CM | POA: Diagnosis not present

## 2016-11-14 DIAGNOSIS — C50911 Malignant neoplasm of unspecified site of right female breast: Secondary | ICD-10-CM

## 2016-11-14 DIAGNOSIS — C787 Secondary malignant neoplasm of liver and intrahepatic bile duct: Secondary | ICD-10-CM | POA: Diagnosis not present

## 2016-11-14 DIAGNOSIS — Z5112 Encounter for antineoplastic immunotherapy: Secondary | ICD-10-CM | POA: Diagnosis not present

## 2016-11-14 DIAGNOSIS — C7951 Secondary malignant neoplasm of bone: Secondary | ICD-10-CM

## 2016-11-14 DIAGNOSIS — Z95828 Presence of other vascular implants and grafts: Secondary | ICD-10-CM

## 2016-11-14 DIAGNOSIS — Z86718 Personal history of other venous thrombosis and embolism: Secondary | ICD-10-CM | POA: Diagnosis not present

## 2016-11-14 DIAGNOSIS — C50919 Malignant neoplasm of unspecified site of unspecified female breast: Secondary | ICD-10-CM

## 2016-11-14 LAB — CBC WITH DIFFERENTIAL/PLATELET
BASO%: 0.8 % (ref 0.0–2.0)
Basophils Absolute: 0.1 10*3/uL (ref 0.0–0.1)
EOS%: 1.3 % (ref 0.0–7.0)
Eosinophils Absolute: 0.1 10*3/uL (ref 0.0–0.5)
HEMATOCRIT: 34.9 % (ref 34.8–46.6)
HEMOGLOBIN: 12 g/dL (ref 11.6–15.9)
LYMPH#: 2.9 10*3/uL (ref 0.9–3.3)
LYMPH%: 27.9 % (ref 14.0–49.7)
MCH: 27.9 pg (ref 25.1–34.0)
MCHC: 34.5 g/dL (ref 31.5–36.0)
MCV: 81 fL (ref 79.5–101.0)
MONO#: 0.8 10*3/uL (ref 0.1–0.9)
MONO%: 7.3 % (ref 0.0–14.0)
NEUT%: 62.7 % (ref 38.4–76.8)
NEUTROS ABS: 6.4 10*3/uL (ref 1.5–6.5)
Platelets: 257 10*3/uL (ref 145–400)
RBC: 4.31 10*6/uL (ref 3.70–5.45)
RDW: 15.4 % — AB (ref 11.2–14.5)
WBC: 10.3 10*3/uL (ref 3.9–10.3)

## 2016-11-14 LAB — COMPREHENSIVE METABOLIC PANEL
ALBUMIN: 3.9 g/dL (ref 3.5–5.0)
ALT: 14 U/L (ref 0–55)
ANION GAP: 10 meq/L (ref 3–11)
AST: 14 U/L (ref 5–34)
Alkaline Phosphatase: 93 U/L (ref 40–150)
BILIRUBIN TOTAL: 1 mg/dL (ref 0.20–1.20)
BUN: 22.1 mg/dL (ref 7.0–26.0)
CALCIUM: 9.7 mg/dL (ref 8.4–10.4)
CO2: 25 mEq/L (ref 22–29)
CREATININE: 0.9 mg/dL (ref 0.6–1.1)
Chloride: 104 mEq/L (ref 98–109)
EGFR: 82 mL/min/{1.73_m2} — ABNORMAL LOW (ref >90)
Glucose: 106 mg/dl (ref 70–140)
Potassium: 4.1 mEq/L (ref 3.5–5.1)
Sodium: 140 mEq/L (ref 136–145)
TOTAL PROTEIN: 8.5 g/dL — AB (ref 6.4–8.3)

## 2016-11-14 LAB — PROTIME-INR
INR: 2.4 (ref 2.00–3.50)
PROTIME: 28.8 s — AB (ref 10.6–13.4)

## 2016-11-14 MED ORDER — LORAZEPAM 2 MG/ML IJ SOLN
INTRAMUSCULAR | Status: AC
  Filled 2016-11-14: qty 1

## 2016-11-14 MED ORDER — ACETAMINOPHEN 325 MG PO TABS
650.0000 mg | ORAL_TABLET | Freq: Once | ORAL | Status: AC
  Administered 2016-11-14: 650 mg via ORAL

## 2016-11-14 MED ORDER — SODIUM CHLORIDE 0.9 % IJ SOLN
10.0000 mL | INTRAMUSCULAR | Status: DC | PRN
  Administered 2016-11-14: 10 mL
  Filled 2016-11-14: qty 10

## 2016-11-14 MED ORDER — HEPARIN SOD (PORK) LOCK FLUSH 100 UNIT/ML IV SOLN
500.0000 [IU] | Freq: Once | INTRAVENOUS | Status: AC | PRN
  Administered 2016-11-14: 500 [IU]
  Filled 2016-11-14: qty 5

## 2016-11-14 MED ORDER — SODIUM CHLORIDE 0.9 % IV SOLN
Freq: Once | INTRAVENOUS | Status: AC
  Administered 2016-11-14: 11:00:00 via INTRAVENOUS

## 2016-11-14 MED ORDER — DIPHENHYDRAMINE HCL 25 MG PO CAPS
ORAL_CAPSULE | ORAL | Status: AC
  Filled 2016-11-14: qty 1

## 2016-11-14 MED ORDER — SODIUM CHLORIDE 0.9 % IV SOLN
798.0000 mg | Freq: Once | INTRAVENOUS | Status: AC
  Administered 2016-11-14: 798 mg via INTRAVENOUS
  Filled 2016-11-14: qty 38

## 2016-11-14 MED ORDER — DIPHENHYDRAMINE HCL 25 MG PO CAPS
25.0000 mg | ORAL_CAPSULE | Freq: Once | ORAL | Status: AC
  Administered 2016-11-14: 25 mg via ORAL

## 2016-11-14 MED ORDER — SODIUM CHLORIDE 0.9 % IJ SOLN
10.0000 mL | INTRAMUSCULAR | Status: DC | PRN
  Administered 2016-11-14: 10 mL via INTRAVENOUS
  Filled 2016-11-14: qty 10

## 2016-11-14 MED ORDER — LORAZEPAM 2 MG/ML IJ SOLN
1.0000 mg | Freq: Once | INTRAMUSCULAR | Status: AC | PRN
  Administered 2016-11-14: 1 mg via INTRAVENOUS

## 2016-11-14 MED ORDER — ACETAMINOPHEN 325 MG PO TABS
ORAL_TABLET | ORAL | Status: AC
  Filled 2016-11-14: qty 2

## 2016-11-14 NOTE — Patient Instructions (Signed)
Implanted Port Home Guide An implanted port is a type of central line that is placed under the skin. Central lines are used to provide IV access when treatment or nutrition needs to be given through a person's veins. Implanted ports are used for long-term IV access. An implanted port may be placed because:  You need IV medicine that would be irritating to the small veins in your hands or arms.  You need long-term IV medicines, such as antibiotics.  You need IV nutrition for a long period.  You need frequent blood draws for lab tests.  You need dialysis.  Implanted ports are usually placed in the chest area, but they can also be placed in the upper arm, the abdomen, or the leg. An implanted port has two main parts:  Reservoir. The reservoir is round and will appear as a small, raised area under your skin. The reservoir is the part where a needle is inserted to give medicines or draw blood.  Catheter. The catheter is a thin, flexible tube that extends from the reservoir. The catheter is placed into a large vein. Medicine that is inserted into the reservoir goes into the catheter and then into the vein.  How will I care for my incision site? Do not get the incision site wet. Bathe or shower as directed by your health care provider. How is my port accessed? Special steps must be taken to access the port:  Before the port is accessed, a numbing cream can be placed on the skin. This helps numb the skin over the port site.  Your health care provider uses a sterile technique to access the port. ? Your health care provider must put on a mask and sterile gloves. ? The skin over your port is cleaned carefully with an antiseptic and allowed to dry. ? The port is gently pinched between sterile gloves, and a needle is inserted into the port.  Only "non-coring" port needles should be used to access the port. Once the port is accessed, a blood return should be checked. This helps ensure that the port  is in the vein and is not clogged.  If your port needs to remain accessed for a constant infusion, a clear (transparent) bandage will be placed over the needle site. The bandage and needle will need to be changed every week, or as directed by your health care provider.  Keep the bandage covering the needle clean and dry. Do not get it wet. Follow your health care provider's instructions on how to take a shower or bath while the port is accessed.  If your port does not need to stay accessed, no bandage is needed over the port.  What is flushing? Flushing helps keep the port from getting clogged. Follow your health care provider's instructions on how and when to flush the port. Ports are usually flushed with saline solution or a medicine called heparin. The need for flushing will depend on how the port is used.  If the port is used for intermittent medicines or blood draws, the port will need to be flushed: ? After medicines have been given. ? After blood has been drawn. ? As part of routine maintenance.  If a constant infusion is running, the port may not need to be flushed.  How long will my port stay implanted? The port can stay in for as long as your health care provider thinks it is needed. When it is time for the port to come out, surgery will be   done to remove it. The procedure is similar to the one performed when the port was put in. When should I seek immediate medical care? When you have an implanted port, you should seek immediate medical care if:  You notice a bad smell coming from the incision site.  You have swelling, redness, or drainage at the incision site.  You have more swelling or pain at the port site or the surrounding area.  You have a fever that is not controlled with medicine.  This information is not intended to replace advice given to you by your health care provider. Make sure you discuss any questions you have with your health care provider. Document  Released: 08/13/2005 Document Revised: 01/19/2016 Document Reviewed: 04/20/2013 Elsevier Interactive Patient Education  2017 Elsevier Inc.  

## 2016-11-14 NOTE — Patient Instructions (Signed)
[facility] Discharge Instructions for Patients Receiving Chemotherapy  Today you received the following chemotherapy agents:  Herceptin  To help prevent nausea and vomiting after your treatment, we encourage you to take your nausea medication as prescribed.   If you develop nausea and vomiting that is not controlled by your nausea medication, call the clinic.   BELOW ARE SYMPTOMS THAT SHOULD BE REPORTED IMMEDIATELY:  *FEVER GREATER THAN 100.5 F  *CHILLS WITH OR WITHOUT FEVER  NAUSEA AND VOMITING THAT IS NOT CONTROLLED WITH YOUR NAUSEA MEDICATION  *UNUSUAL SHORTNESS OF BREATH  *UNUSUAL BRUISING OR BLEEDING  TENDERNESS IN MOUTH AND THROAT WITH OR WITHOUT PRESENCE OF ULCERS  *URINARY PROBLEMS  *BOWEL PROBLEMS  UNUSUAL RASH Items with * indicate a potential emergency and should be followed up as soon as possible.  Feel free to call the clinic you have any questions or concerns. The clinic phone number is [phone].  Please show the CHEMO ALERT CARD at check-in to the Emergency Department and triage nurse.   

## 2016-11-14 NOTE — Progress Notes (Signed)
[facility]  [phone] [phone]  OFFICE PROGRESS NOTE  ID: [name]   DOB: September 02, 1950  MR#: [id number]  ACZ#:[id number]  PCP: Yolanda Kiel, MD GYN:  SU:  OTHER MD: [name], [name], [name]  CC: Metastatic HER-2 positive, estrogen receptor negative breast cancer  CURRENT TREATMENT: Trastuzumab   INTERVAL HISTORY: [name] returns today for follow-up of her HER-2/neu positive breast cancer accompanied by her husband. Interval history is generally unremarkable. She tolerates the trastuzumab without any unusual side effects. She had her most recent echocardiogram in December, and it showed a well-preserved ejection fraction.  REVIEW OF SYSTEMS: [place] and to have significant arthritis problems. She gets cramps in her chest and she has pain in the surgical breast as well. Occasionally she has right-sided headaches, but these are infrequent. She has had no bleeding problems associated with her Coumadin use. A detailed review of systems today was otherwise stable.  BREAST CANCER HISTORY:   From the earlier summary:  [name] is 66 years old [name] female.  This woman has been in good health all of her life.  She noted a swelling and discomfort in her right breast in June 2004.  She was seen in the Emergency Room in [place] and was treated for mastitis.  She was treated for a number of months with mastitis and the swelling did not get better. She was given hydrocodone and Cipro.  Finally, the swelling did get better and ultimately the nipple became retracted and she noticed some dimpling in her skin. She had a mammogram in July of 2004 in [place] with subsequent mammogram on May 27, 2003, by Dr. Isaiah Davis.  Mammogram done on September 30 showed marked increased density in the left breast.  Biopsy was performed the same day.  It was noted at the 12 o'clock position, deep in the breast was a  focal hypoechoic mass, at least 3.5 cm in diameter.  Biopsy did in fact show invasive in situ mammary carcinoma. This was felt to be both at least intermediate, high grade.  No definite lymphovascular invasion was identified. ER and PR negative, Her2 testing positive. [name] continues to have pain in her breast.  She continues to take hydrocodone a number of times a day.  She has been seen by Dr. Rosana Davis, who felt that neoadjuvant chemotherapy would be required.    Initial staging studies showed evidence of liver and lung mets.   Patient also has evidence of bone lesions. Patient started neoadjuvant chemotherapy, Taxotere/Carbo/Herceptin in October 2004.   Patient had a CT scan in December 2004 which demonstrated extensive clot in the SVC innominate vein, bilateral jugular vein and  She was started on anticoagulation therapy. She received a total of 6 cycles of Taxotere/Carbo/Herceptin, completed in April 2005."  Patient has been on  trastuzumab continued indefinitely; has also received lapatinib and capecitabine for variable intervals in 2007-2008. Most recent echo 12/01/2013 showed an ejection fraction of 55%. She is status post bilateral mastectomies with bilateral axillary lymph node dissection 12/07/2004, showing (a) on the right, a mypT1c ypN1 invasive ductal carcinoma, grade 3, estrogen and progesterone receptor negative, HER-2 positive, with an MIB-1 of 31% (b) on the left, ypT2 ypN1 invasive ductal carcinoma, grade 2, estrogen and progesterone receptor negative, HER-2 positive, with an MIB-1 of 35%. She is Status post radiation June through July of 2006, to the right chest wall, left chest wall, bilateral supraclavicular fossae, and bilateral axillary boosts; with additional radiation  to the right and left chest walls and the central chest wall completed November of 2007. She is status post ixempra x9 completed August of 2009. She has history of superior vena caval syndrome, on life long anticoagulation.  She has History of chemotherapy-induced neuropathy. Patient has chronic pain, with negative PET scan 08/24/2013 (no evidence of active cancer). On Neurontin and Tramadol therapy.  Her subsequent history is as detailed below  PAST MEDICAL HISTORY: Past Medical History:  Diagnosis Date  . Breast cancer ([facility])    mets to liver and lung  . Breast cancer metastasized to multiple sites ([facility]) 02/26/2013  . History of chemotherapy Feb. 2006   taxotere/herceptin/carboplatin  . Hypertension   . Neuropathy ([facility])   . Radiation 07/31/2006   left upper chest  . Radiation 06/17/2006-06/27/2006   6480 cGy bilat. chest wall  . SVC syndrome   . Thrombosis     PAST SURGICAL HISTORY: Past Surgical History:  Procedure Laterality Date  . ANKLE SURGERY    . BACK SURGERY    . CHOLECYSTECTOMY  1989  . MASTECTOMY Bilateral   . PERIPHERALLY INSERTED CENTRAL CATHETER INSERTION    . TUBAL LIGATION  1986    FAMILY HISTORY Family History  Problem Relation Age of Onset  . Heart failure Father   . Cancer Father     Prostate cancer  . Heart failure Brother   . Cancer Brother     Prostate cancer  . Diabetes Maternal Aunt    She had three brothers, one died of gunshot wound, one of complications of diabetes mellitus and one of myocardial infarction.  She has no sisters.  Mother died of complications of brain metastasis in 54.  Father has had a myocardial infarction in 1999.  No history of breast or ovarian cancer in the family.    GYNECOLOGIC HISTORY:   Menarche at age 25.  Gravida 3, para 3.  First live birth at age 59.  No history of breast feeding. No history of hormonal replacement therapy.   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 Her husband used to work as a Art gallery manager, but is now retired. She has three children, Monette who lives in [place] and works as a Hydrographic surveyor, Financial risk analyst who lives in [place] and works as a Dietitian, and Mendon who lives in [place] and also works as a Hydrographic surveyor. The patient has 12 grandchildren and 4 great-grandchildren. She attends a [employer]. Very involved with school kids.  ADVANCED DIRECTIVES:  Not in place  HEALTH MAINTENANCE: (Updated 06/12/2013) Social History  Substance Use Topics  . Smoking status: Never Smoker  . Smokeless tobacco: Never Used  . Alcohol use Yes     Comment: occasional     Colonoscopy: Never and "I don't want one"  PAP:  1987  Bone density:  Never  Lipid panel:  Not on file    Allergies  Allergen Reactions  . Penicillins Hives    PATIENT HAS TOLERATED CEPHALOSPORINGS  . Adhesive [Tape] Other (See Comments)    Tears skin     Current Outpatient Prescriptions  Medication Sig Dispense Refill  . acetaminophen (TYLENOL) 500 MG tablet Take 1,000 mg by mouth every 6 (six) hours as needed for mild pain or fever.     Marland Kitchen albuterol (PROVENTIL HFA;VENTOLIN HFA) 108 (90 BASE) MCG/ACT inhaler Inhale 2 puffs into the lungs every 6 (six) hours as needed  for wheezing. 1 Inhaler 5  . ALPRAZolam (XANAX) 1 MG tablet Take 1 tablet (1 mg total) by mouth 3 (three) times daily as needed. for anxiety 60 tablet 2  . amLODipine (NORVASC) 10 MG tablet Take 10 mg by mouth every morning.    . baclofen (LIORESAL) 10 MG tablet Take 1 tablet (10 mg total) by mouth 3 (three) times daily as needed. for muscle spams 270 tablet 3  . carvedilol (COREG) 3.125 MG tablet Take 1 tablet (3.125 mg total) by mouth 2 (two) times daily. 180 tablet 3  . diclofenac sodium (VOLTAREN) 1 % GEL Apply 2 g topically daily as needed (for pain). Apply to knees and shoulders 100 g 6  . furosemide (LASIX) 80 MG tablet Take 80 mg by mouth daily as needed for fluid.     Marland Kitchen gabapentin (NEURONTIN) 300 MG capsule TAKE 2 CAPSULES(600 MG) BY MOUTH THREE TIMES DAILY 540 capsule 0  . losartan (COZAAR) 100 MG tablet Take 100 mg by mouth every morning.    . potassium chloride (K-DUR,KLOR-CON)  10 MEQ tablet Take 4 tablets (40 mEq total) by mouth daily as needed. 120 tablet 5  . spironolactone (ALDACTONE) 25 MG tablet TAKE 1 TABLET BY MOUTH EVERY DAY 30 tablet 0  . temazepam (RESTORIL) 30 MG capsule TAKE ONE CAPSULE BY MOUTH EVERY NIGHT AT BEDTIME AS NEEDED FOR SLEEP 30 capsule 2  . venlafaxine XR (EFFEXOR-XR) 37.5 MG 24 hr capsule Take 1 capsule (37.5 mg total) by mouth daily with breakfast. 30 capsule 5  . warfarin (COUMADIN) 5 MG tablet Take 1 tablet (5 mg total) by mouth daily. 90 tablet 0   No current facility-administered medications for this visit.    Facility-Administered Medications Ordered in Other Visits  Medication Dose Route Frequency Provider Last Rate Last Dose  . sodium chloride flush (NS) 0.9 % injection 10 mL  10 mL Intravenous PRN Chauncey Cruel, MD   10 mL at 12/15/15 1200    OBJECTIVE:  Morbidly obese African American woman who appears stated age 42:   11/14/16 0919  BP: (!) 154/67  Pulse: 91  Resp: 18  Temp: 98.7 F (37.1 C)     Body mass index is 53.37 kg/m.    ECOG FS: 1 Filed Weights   11/14/16 0919  Weight: (!) 301 lb 4.8 oz (136.7 kg)   Sclerae unicteric, EOMs intact Oropharynx clear and moist No cervical or supraclavicular adenopathy Lungs no rales or rhonchi Heart regular rate and rhythm Abd soft, obese, nontender, positive bowel sounds MSK no focal spinal tenderness, no upper extremity lymphedema Neuro: nonfocal, well oriented, appropriate affect Breasts: Status post bilateral mastectomies. There is no evidence of chest wall recurrence. Both axillae are benign.    LAB RESULTS: Lab Results  Component Value Date   WBC 10.3 11/14/2016   NEUTROABS 6.4 11/14/2016   HGB 12.0 11/14/2016   HCT 34.9 11/14/2016   MCV 81.0 11/14/2016   PLT 257 11/14/2016      Chemistry      Component Value Date/Time   NA 143 10/24/2016 1000   K 4.2 10/24/2016 1000   CL 107 12/09/2014 0940   CL 104 01/30/2013 0850   CO2 27 10/24/2016 1000    BUN 28.4 (H) 10/24/2016 1000   CREATININE 1.0 10/24/2016 1000      Component Value Date/Time   CALCIUM 9.6 10/24/2016 1000   ALKPHOS 88 10/24/2016 1000   AST 14 10/24/2016 1000   ALT 13 10/24/2016 1000  BILITOT 0.66 10/24/2016 1000     STUDIES: No results found.  ASSESSMENT: 66 y.o.  [place], [place], woman  (1)  with a history of inflammatory right breast cancer metastatic at presentation September 2004 with involvement of liver and bone, HER-2 positive, estrogen and progesterone receptor negative  (2) treated with carboplatin, docetaxel and Herceptin x 6 completed April 2005  (3) trastuzumab continued indefinitely;   (a) has also received lapatinib and capecitabine for variable intervals in 2007-2008.  (b) Most recent echo 012/06/2016 showed an ejection fraction of 55-60 %  To be performed every 6 months   (4) status post bilateral mastectomies with bilateral axillary lymph node dissection 12/07/2004, showing  (a) on the right, a mypT1c ypN1 invasive ductal carcinoma, grade 3, estrogen and progesterone receptor negative, HER-2 positive, with an MIB-1 of 31%  (b) on the left, ypT2 ypN1 invasive ductal carcinoma, grade 2, estrogen and progesterone receptor negative, HER-2 positive, with an MIB-1 of 35%.  (5)  Status post radiation June through July of 2006, to the right chest wall, left chest wall, bilateral supraclavicular fossae, and bilateral axillary boosts; with additional radiation to the right and left chest walls and the central chest wall completed November of 2007  (6) status post Ixempra x9 completed August of 2009.  (7) history of superior vena caval syndrome, on life long anticoagulation   (8)  History of chemotherapy-induced neuropathy.   (9)  chronic pain, with negative PET scan 08/24/2013 (no evidence of active cancer). On Neurontin and Tramadol  (a) repeat PET scan December 2016 again negative.  (10) right upper extremity cellulitis, no bacteremia;  treated with cephalexin /doxycycline for 2 weeks, with resolution   PLAN: [name] is currently 13-1/2 years out from initial diagnosis of her inflammatory breast cancer, and 11-1/2 years out from diagnosis of metastatic disease. There continues to be no evidence of disease activity.  She continues to tolerate trastuzumab well and the plan is to continue this indefinitely. She currently gets echocardiograms every 6 months to the next one will be due in June.  We now have data for giving trastuzumab every 4 weeks to patient's like her, who are on this drug long-term. The patient is very interested in this as it would significantly decrease the amount of travel she has to go through to get her treatments here.  Accordingly we are changing her treatments to every 4 weeks. She will see me again 3 months from now. She should be having her echocardiogram shortly before that visit  Sometime before the end of the year we will repeat staging studies.  She has a good understanding of the overall plan. She agrees with it. She knows to call for any problems that may develop before her next visit.  Chauncey Cruel, MD  11/14/16 9:41 AM

## 2016-12-12 ENCOUNTER — Ambulatory Visit (HOSPITAL_BASED_OUTPATIENT_CLINIC_OR_DEPARTMENT_OTHER): Payer: Medicare Other

## 2016-12-12 ENCOUNTER — Other Ambulatory Visit: Payer: Medicare Other

## 2016-12-12 ENCOUNTER — Telehealth: Payer: Self-pay

## 2016-12-12 VITALS — BP 125/74 | HR 61 | Temp 98.1°F | Resp 20

## 2016-12-12 DIAGNOSIS — Z5112 Encounter for antineoplastic immunotherapy: Secondary | ICD-10-CM

## 2016-12-12 DIAGNOSIS — C50912 Malignant neoplasm of unspecified site of left female breast: Secondary | ICD-10-CM

## 2016-12-12 DIAGNOSIS — C7951 Secondary malignant neoplasm of bone: Secondary | ICD-10-CM | POA: Diagnosis not present

## 2016-12-12 DIAGNOSIS — C50911 Malignant neoplasm of unspecified site of right female breast: Secondary | ICD-10-CM | POA: Diagnosis not present

## 2016-12-12 DIAGNOSIS — C787 Secondary malignant neoplasm of liver and intrahepatic bile duct: Secondary | ICD-10-CM

## 2016-12-12 DIAGNOSIS — C50919 Malignant neoplasm of unspecified site of unspecified female breast: Secondary | ICD-10-CM

## 2016-12-12 LAB — COMPREHENSIVE METABOLIC PANEL
ALT: 13 U/L (ref 0–55)
AST: 12 U/L (ref 5–34)
Albumin: 3.8 g/dL (ref 3.5–5.0)
Alkaline Phosphatase: 85 U/L (ref 40–150)
Anion Gap: 13 mEq/L — ABNORMAL HIGH (ref 3–11)
BILIRUBIN TOTAL: 0.94 mg/dL (ref 0.20–1.20)
BUN: 19.1 mg/dL (ref 7.0–26.0)
CO2: 22 meq/L (ref 22–29)
Calcium: 9.8 mg/dL (ref 8.4–10.4)
Chloride: 107 mEq/L (ref 98–109)
Creatinine: 0.8 mg/dL (ref 0.6–1.1)
EGFR: 87 mL/min/{1.73_m2} — AB (ref >90)
GLUCOSE: 101 mg/dL (ref 70–140)
Potassium: 3.9 mEq/L (ref 3.5–5.1)
Sodium: 143 mEq/L (ref 136–145)
TOTAL PROTEIN: 8.4 g/dL — AB (ref 6.4–8.3)

## 2016-12-12 LAB — CBC WITH DIFFERENTIAL/PLATELET
BASO%: 0.2 % (ref 0.0–2.0)
Basophils Absolute: 0 10*3/uL (ref 0.0–0.1)
EOS%: 1.6 % (ref 0.0–7.0)
Eosinophils Absolute: 0.2 10*3/uL (ref 0.0–0.5)
HCT: 34.2 % — ABNORMAL LOW (ref 34.8–46.6)
HEMOGLOBIN: 11.9 g/dL (ref 11.6–15.9)
LYMPH%: 27.7 % (ref 14.0–49.7)
MCH: 27.5 pg (ref 25.1–34.0)
MCHC: 34.8 g/dL (ref 31.5–36.0)
MCV: 79.2 fL — AB (ref 79.5–101.0)
MONO#: 0.6 10*3/uL (ref 0.1–0.9)
MONO%: 5.9 % (ref 0.0–14.0)
NEUT%: 64.6 % (ref 38.4–76.8)
NEUTROS ABS: 6.2 10*3/uL (ref 1.5–6.5)
Platelets: 243 10*3/uL (ref 145–400)
RBC: 4.32 10*6/uL (ref 3.70–5.45)
RDW: 15.5 % — AB (ref 11.2–14.5)
WBC: 9.5 10*3/uL (ref 3.9–10.3)
lymph#: 2.6 10*3/uL (ref 0.9–3.3)

## 2016-12-12 LAB — PROTIME-INR
INR: 2 (ref 2.00–3.50)
PROTIME: 24 s — AB (ref 10.6–13.4)

## 2016-12-12 MED ORDER — HEPARIN SOD (PORK) LOCK FLUSH 100 UNIT/ML IV SOLN
500.0000 [IU] | Freq: Once | INTRAVENOUS | Status: AC | PRN
  Administered 2016-12-12: 500 [IU]
  Filled 2016-12-12: qty 5

## 2016-12-12 MED ORDER — SODIUM CHLORIDE 0.9 % IJ SOLN
10.0000 mL | INTRAMUSCULAR | Status: DC | PRN
  Administered 2016-12-12: 10 mL
  Filled 2016-12-12: qty 10

## 2016-12-12 MED ORDER — TRASTUZUMAB CHEMO 150 MG IV SOLR
798.0000 mg | Freq: Once | INTRAVENOUS | Status: AC
  Administered 2016-12-12: 798 mg via INTRAVENOUS
  Filled 2016-12-12: qty 38

## 2016-12-12 MED ORDER — LORAZEPAM 2 MG/ML IJ SOLN
1.0000 mg | Freq: Once | INTRAMUSCULAR | Status: AC | PRN
Start: 2016-12-12 — End: 2016-12-12
  Administered 2016-12-12: 1 mg via INTRAVENOUS

## 2016-12-12 MED ORDER — SODIUM CHLORIDE 0.9 % IJ SOLN
3.0000 mL | INTRAMUSCULAR | Status: DC | PRN
  Filled 2016-12-12: qty 10

## 2016-12-12 MED ORDER — LORAZEPAM 2 MG/ML IJ SOLN
INTRAMUSCULAR | Status: AC
  Filled 2016-12-12: qty 1

## 2016-12-12 MED ORDER — ACETAMINOPHEN 325 MG PO TABS
650.0000 mg | ORAL_TABLET | Freq: Once | ORAL | Status: AC
  Administered 2016-12-12: 650 mg via ORAL

## 2016-12-12 MED ORDER — ACETAMINOPHEN 325 MG PO TABS
ORAL_TABLET | ORAL | Status: AC
  Filled 2016-12-12: qty 2

## 2016-12-12 MED ORDER — DIPHENHYDRAMINE HCL 25 MG PO CAPS
ORAL_CAPSULE | ORAL | Status: AC
  Filled 2016-12-12: qty 1

## 2016-12-12 MED ORDER — DIPHENHYDRAMINE HCL 25 MG PO CAPS
25.0000 mg | ORAL_CAPSULE | Freq: Once | ORAL | Status: AC
Start: 2016-12-12 — End: 2016-12-12
  Administered 2016-12-12: 25 mg via ORAL

## 2016-12-12 MED ORDER — HEPARIN SOD (PORK) LOCK FLUSH 100 UNIT/ML IV SOLN
250.0000 [IU] | Freq: Once | INTRAVENOUS | Status: DC | PRN
  Filled 2016-12-12: qty 5

## 2016-12-12 MED ORDER — SODIUM CHLORIDE 0.9 % IV SOLN
Freq: Once | INTRAVENOUS | Status: AC
  Administered 2016-12-12: 09:00:00 via INTRAVENOUS

## 2016-12-12 MED ORDER — ALTEPLASE 2 MG IJ SOLR
2.0000 mg | Freq: Once | INTRAMUSCULAR | Status: DC | PRN
  Filled 2016-12-12: qty 2

## 2016-12-12 NOTE — Telephone Encounter (Signed)
INR 2.0.  Per Dr Jana Hakim pt to cont warfarin  5mg  daily. LVM for pt

## 2016-12-12 NOTE — Patient Instructions (Signed)
Trastuzumab injection for infusion What is this medicine? TRASTUZUMAB (tras TOO zoo mab) is a monoclonal antibody. It is used to treat breast cancer and stomach cancer. This medicine may be used for other purposes; ask your health care provider or pharmacist if you have questions. COMMON BRAND NAME(S): Herceptin What should I tell my health care provider before I take this medicine? They need to know if you have any of these conditions: -heart disease -heart failure -lung or breathing disease, like asthma -an unusual or allergic reaction to trastuzumab, benzyl alcohol, or other medications, foods, dyes, or preservatives -pregnant or trying to get pregnant -breast-feeding How should I use this medicine? This drug is given as an infusion into a vein. It is administered in a hospital or clinic by a specially trained health care professional. Talk to your pediatrician regarding the use of this medicine in children. This medicine is not approved for use in children. Overdosage: If you think you have taken too much of this medicine contact a poison control center or emergency room at once. NOTE: This medicine is only for you. Do not share this medicine with others. What if I miss a dose? It is important not to miss a dose. Call your doctor or health care professional if you are unable to keep an appointment. What may interact with this medicine? This medicine may interact with the following medications: -certain types of chemotherapy, such as daunorubicin, doxorubicin, epirubicin, and idarubicin This list may not describe all possible interactions. Give your health care provider a list of all the medicines, herbs, non-prescription drugs, or dietary supplements you use. Also tell them if you smoke, drink alcohol, or use illegal drugs. Some items may interact with your medicine. What should I watch for while using this medicine? Visit your doctor for checks on your progress. Report any side effects.  Continue your course of treatment even though you feel ill unless your doctor tells you to stop. Call your doctor or health care professional for advice if you get a fever, chills or sore throat, or other symptoms of a cold or flu. Do not treat yourself. Try to avoid being around people who are sick. You may experience fever, chills and shaking during your first infusion. These effects are usually mild and can be treated with other medicines. Report any side effects during the infusion to your health care professional. Fever and chills usually do not happen with later infusions. Do not become pregnant while taking this medicine or for 7 months after stopping it. Women should inform their doctor if they wish to become pregnant or think they might be pregnant. Women of child-bearing potential will need to have a negative pregnancy test before starting this medicine. There is a potential for serious side effects to an unborn child. Talk to your health care professional or pharmacist for more information. Do not breast-feed an infant while taking this medicine or for 7 months after stopping it. Women must use effective birth control with this medicine. What side effects may I notice from receiving this medicine? Side effects that you should report to your doctor or health care professional as soon as possible: -allergic reactions like skin rash, itching or hives, swelling of the face, lips, or tongue -chest pain or palpitations -cough -dizziness -feeling faint or lightheaded, falls -fever -general ill feeling or flu-like symptoms -signs of worsening heart failure like breathing problems; swelling in your legs and feet -unusually weak or tired Side effects that usually do not require medical   attention (report to your doctor or health care professional if they continue or are bothersome): -bone pain -changes in taste -diarrhea -joint pain -nausea/vomiting -weight loss This list may not describe all  possible side effects. Call your doctor for medical advice about side effects. You may report side effects to FDA at 1-800-FDA-1088. Where should I keep my medicine? This drug is given in a hospital or clinic and will not be stored at home. NOTE: This sheet is a summary. It may not cover all possible information. If you have questions about this medicine, talk to your doctor, pharmacist, or health care provider.  2018 Elsevier/Gold Standard (2016-08-07 14:37:52)  

## 2016-12-12 NOTE — Progress Notes (Signed)
Message to Dr Jana Hakim to order MUGA for this month.

## 2016-12-13 ENCOUNTER — Other Ambulatory Visit: Payer: Self-pay | Admitting: Oncology

## 2016-12-13 DIAGNOSIS — I5022 Chronic systolic (congestive) heart failure: Secondary | ICD-10-CM

## 2016-12-24 ENCOUNTER — Other Ambulatory Visit: Payer: Self-pay | Admitting: Oncology

## 2016-12-27 DIAGNOSIS — M170 Bilateral primary osteoarthritis of knee: Secondary | ICD-10-CM | POA: Diagnosis not present

## 2016-12-27 DIAGNOSIS — I10 Essential (primary) hypertension: Secondary | ICD-10-CM | POA: Diagnosis not present

## 2016-12-27 DIAGNOSIS — Z1389 Encounter for screening for other disorder: Secondary | ICD-10-CM | POA: Diagnosis not present

## 2016-12-27 DIAGNOSIS — Z23 Encounter for immunization: Secondary | ICD-10-CM | POA: Diagnosis not present

## 2016-12-27 DIAGNOSIS — Z0001 Encounter for general adult medical examination with abnormal findings: Secondary | ICD-10-CM | POA: Diagnosis not present

## 2016-12-27 DIAGNOSIS — G629 Polyneuropathy, unspecified: Secondary | ICD-10-CM | POA: Diagnosis not present

## 2017-01-06 ENCOUNTER — Other Ambulatory Visit (HOSPITAL_COMMUNITY): Payer: Self-pay | Admitting: Internal Medicine

## 2017-01-09 ENCOUNTER — Other Ambulatory Visit (HOSPITAL_BASED_OUTPATIENT_CLINIC_OR_DEPARTMENT_OTHER): Payer: Medicare Other

## 2017-01-09 ENCOUNTER — Ambulatory Visit: Payer: Medicare Other

## 2017-01-09 ENCOUNTER — Ambulatory Visit (HOSPITAL_BASED_OUTPATIENT_CLINIC_OR_DEPARTMENT_OTHER): Payer: Medicare Other

## 2017-01-09 VITALS — BP 137/68 | HR 62 | Temp 98.3°F | Resp 20

## 2017-01-09 DIAGNOSIS — C50911 Malignant neoplasm of unspecified site of right female breast: Secondary | ICD-10-CM

## 2017-01-09 DIAGNOSIS — Z86718 Personal history of other venous thrombosis and embolism: Secondary | ICD-10-CM

## 2017-01-09 DIAGNOSIS — Z5112 Encounter for antineoplastic immunotherapy: Secondary | ICD-10-CM | POA: Diagnosis not present

## 2017-01-09 DIAGNOSIS — C50912 Malignant neoplasm of unspecified site of left female breast: Secondary | ICD-10-CM

## 2017-01-09 DIAGNOSIS — Z95828 Presence of other vascular implants and grafts: Secondary | ICD-10-CM

## 2017-01-09 DIAGNOSIS — C7951 Secondary malignant neoplasm of bone: Secondary | ICD-10-CM | POA: Diagnosis not present

## 2017-01-09 DIAGNOSIS — C50919 Malignant neoplasm of unspecified site of unspecified female breast: Secondary | ICD-10-CM

## 2017-01-09 LAB — CBC WITH DIFFERENTIAL/PLATELET
BASO%: 0.3 % (ref 0.0–2.0)
Basophils Absolute: 0 10*3/uL (ref 0.0–0.1)
EOS%: 2.4 % (ref 0.0–7.0)
Eosinophils Absolute: 0.2 10*3/uL (ref 0.0–0.5)
HEMATOCRIT: 34.3 % — AB (ref 34.8–46.6)
HEMOGLOBIN: 11.8 g/dL (ref 11.6–15.9)
LYMPH#: 2.7 10*3/uL (ref 0.9–3.3)
LYMPH%: 34.2 % (ref 14.0–49.7)
MCH: 28 pg (ref 25.1–34.0)
MCHC: 34.3 g/dL (ref 31.5–36.0)
MCV: 81.6 fL (ref 79.5–101.0)
MONO#: 0.6 10*3/uL (ref 0.1–0.9)
MONO%: 7.3 % (ref 0.0–14.0)
NEUT#: 4.4 10*3/uL (ref 1.5–6.5)
NEUT%: 55.8 % (ref 38.4–76.8)
PLATELETS: 213 10*3/uL (ref 145–400)
RBC: 4.2 10*6/uL (ref 3.70–5.45)
RDW: 15.5 % — AB (ref 11.2–14.5)
WBC: 7.9 10*3/uL (ref 3.9–10.3)

## 2017-01-09 LAB — COMPREHENSIVE METABOLIC PANEL
ALT: 14 U/L (ref 0–55)
AST: 12 U/L (ref 5–34)
Albumin: 3.5 g/dL (ref 3.5–5.0)
Alkaline Phosphatase: 82 U/L (ref 40–150)
Anion Gap: 8 mEq/L (ref 3–11)
BUN: 16 mg/dL (ref 7.0–26.0)
CALCIUM: 9.2 mg/dL (ref 8.4–10.4)
CHLORIDE: 110 meq/L — AB (ref 98–109)
CO2: 25 mEq/L (ref 22–29)
CREATININE: 0.8 mg/dL (ref 0.6–1.1)
EGFR: 84 mL/min/{1.73_m2} — ABNORMAL LOW (ref >90)
Glucose: 95 mg/dl (ref 70–140)
Potassium: 4 mEq/L (ref 3.5–5.1)
Sodium: 142 mEq/L (ref 136–145)
Total Bilirubin: 0.65 mg/dL (ref 0.20–1.20)
Total Protein: 7.7 g/dL (ref 6.4–8.3)

## 2017-01-09 LAB — PROTIME-INR
INR: 2.5 (ref 2.00–3.50)
PROTIME: 30 s — AB (ref 10.6–13.4)

## 2017-01-09 MED ORDER — HEPARIN SOD (PORK) LOCK FLUSH 100 UNIT/ML IV SOLN
500.0000 [IU] | Freq: Once | INTRAVENOUS | Status: AC | PRN
  Administered 2017-01-09: 500 [IU]
  Filled 2017-01-09: qty 5

## 2017-01-09 MED ORDER — ACETAMINOPHEN 325 MG PO TABS
ORAL_TABLET | ORAL | Status: AC
  Filled 2017-01-09: qty 2

## 2017-01-09 MED ORDER — DIPHENHYDRAMINE HCL 25 MG PO CAPS
25.0000 mg | ORAL_CAPSULE | Freq: Once | ORAL | Status: AC
  Administered 2017-01-09: 25 mg via ORAL

## 2017-01-09 MED ORDER — LORAZEPAM 2 MG/ML IJ SOLN
1.0000 mg | Freq: Once | INTRAMUSCULAR | Status: AC | PRN
  Administered 2017-01-09: 1 mg via INTRAVENOUS

## 2017-01-09 MED ORDER — SODIUM CHLORIDE 0.9 % IV SOLN
Freq: Once | INTRAVENOUS | Status: AC
  Administered 2017-01-09: 09:00:00 via INTRAVENOUS

## 2017-01-09 MED ORDER — SODIUM CHLORIDE 0.9 % IJ SOLN
10.0000 mL | INTRAMUSCULAR | Status: DC | PRN
  Administered 2017-01-09: 10 mL
  Filled 2017-01-09: qty 10

## 2017-01-09 MED ORDER — SODIUM CHLORIDE 0.9 % IJ SOLN
10.0000 mL | INTRAMUSCULAR | Status: DC | PRN
  Administered 2017-01-09: 10 mL via INTRAVENOUS
  Filled 2017-01-09: qty 10

## 2017-01-09 MED ORDER — SODIUM CHLORIDE 0.9 % IV SOLN
798.0000 mg | Freq: Once | INTRAVENOUS | Status: AC
  Administered 2017-01-09: 798 mg via INTRAVENOUS
  Filled 2017-01-09: qty 38

## 2017-01-09 MED ORDER — DIPHENHYDRAMINE HCL 25 MG PO CAPS
ORAL_CAPSULE | ORAL | Status: AC
  Filled 2017-01-09: qty 1

## 2017-01-09 MED ORDER — LORAZEPAM 2 MG/ML IJ SOLN
INTRAMUSCULAR | Status: AC
  Filled 2017-01-09: qty 1

## 2017-01-09 MED ORDER — ACETAMINOPHEN 325 MG PO TABS
650.0000 mg | ORAL_TABLET | Freq: Once | ORAL | Status: AC
  Administered 2017-01-09: 650 mg via ORAL

## 2017-01-09 NOTE — Patient Instructions (Signed)
[facility] Discharge Instructions for Patients Receiving Chemotherapy  Today you received the following chemotherapy agents:  Herceptin  To help prevent nausea and vomiting after your treatment, we encourage you to take your nausea medication as prescribed.   If you develop nausea and vomiting that is not controlled by your nausea medication, call the clinic.   BELOW ARE SYMPTOMS THAT SHOULD BE REPORTED IMMEDIATELY:  *FEVER GREATER THAN 100.5 F  *CHILLS WITH OR WITHOUT FEVER  NAUSEA AND VOMITING THAT IS NOT CONTROLLED WITH YOUR NAUSEA MEDICATION  *UNUSUAL SHORTNESS OF BREATH  *UNUSUAL BRUISING OR BLEEDING  TENDERNESS IN MOUTH AND THROAT WITH OR WITHOUT PRESENCE OF ULCERS  *URINARY PROBLEMS  *BOWEL PROBLEMS  UNUSUAL RASH Items with * indicate a potential emergency and should be followed up as soon as possible.  Feel free to call the clinic you have any questions or concerns. The clinic phone number is [phone].  Please show the CHEMO ALERT CARD at check-in to the Emergency Department and triage nurse.   

## 2017-01-14 ENCOUNTER — Ambulatory Visit (HOSPITAL_COMMUNITY)
Admission: RE | Admit: 2017-01-14 | Discharge: 2017-01-14 | Disposition: A | Discharge status: Home / Self Care | Payer: Medicare Other | Source: Ambulatory Visit | Attending: Internal Medicine | Admitting: Internal Medicine

## 2017-01-14 ENCOUNTER — Encounter (HOSPITAL_COMMUNITY): Payer: Self-pay

## 2017-01-14 ENCOUNTER — Ambulatory Visit (HOSPITAL_BASED_OUTPATIENT_CLINIC_OR_DEPARTMENT_OTHER)
Admission: RE | Admit: 2017-01-14 | Discharge: 2017-01-14 | Disposition: A | Discharge status: Home / Self Care | Payer: Medicare Other | Source: Ambulatory Visit | Attending: Cardiology | Admitting: Cardiology

## 2017-01-14 VITALS — BP 156/94 | HR 61 | Ht 63.0 in | Wt 293.8 lb

## 2017-01-14 DIAGNOSIS — C50912 Malignant neoplasm of unspecified site of left female breast: Secondary | ICD-10-CM | POA: Diagnosis not present

## 2017-01-14 DIAGNOSIS — Z853 Personal history of malignant neoplasm of breast: Secondary | ICD-10-CM | POA: Diagnosis not present

## 2017-01-14 DIAGNOSIS — I509 Heart failure, unspecified: Secondary | ICD-10-CM

## 2017-01-14 DIAGNOSIS — Z9013 Acquired absence of bilateral breasts and nipples: Secondary | ICD-10-CM | POA: Diagnosis not present

## 2017-01-14 DIAGNOSIS — I110 Hypertensive heart disease with heart failure: Secondary | ICD-10-CM | POA: Diagnosis not present

## 2017-01-14 DIAGNOSIS — I5022 Chronic systolic (congestive) heart failure: Secondary | ICD-10-CM | POA: Insufficient documentation

## 2017-01-14 DIAGNOSIS — Z79899 Other long term (current) drug therapy: Secondary | ICD-10-CM | POA: Insufficient documentation

## 2017-01-14 DIAGNOSIS — C787 Secondary malignant neoplasm of liver and intrahepatic bile duct: Secondary | ICD-10-CM | POA: Insufficient documentation

## 2017-01-14 DIAGNOSIS — C50919 Malignant neoplasm of unspecified site of unspecified female breast: Secondary | ICD-10-CM

## 2017-01-14 DIAGNOSIS — Z7901 Long term (current) use of anticoagulants: Secondary | ICD-10-CM | POA: Insufficient documentation

## 2017-01-14 MED ORDER — POTASSIUM CHLORIDE CRYS ER 10 MEQ PO TBCR: EXTENDED_RELEASE_TABLET | ORAL | 5 refills | Status: DC

## 2017-01-14 NOTE — Progress Notes (Signed)
Advanced Heart Failure Clinic Note   Patient ID: JESUSA STENERSON, female   DOB: 1951-02-20, 66 y.o.   MRN: [id number] Oncologist: Dr Jana Hakim  HPI: Mrs Dohmen is a 66 year old with a history of metastatic HER-2 positive breast carcinoma originally diagnosed September 2004. Started in R breast. Underwent neo-adjuvant chemo. During this time developed L breast CA. Underwent bilateral mastectomy. Lymph nodes +. 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   She received a total of 6 cycles of Taxotere/Carbo/Herceptin, completed in April 2005, after which she began single agent Herceptin, given every 4 weeks now. Plan to continue on Herceptin indefinitely.   She has been stable symptomatically.  Takes Lasix occasionally (prn).  No chest pain. Dyspnea with heavy exertion.  She does have ankle edema.  BP today is high but she has not taken her home meds yet.  SBP 130s at home. Weight down 5 lbs.   Labs 11/15: K 4, creatinine 1.0   Labs 1/16: K 4.1, creatinine 1.0 Labs 8/16: K 4.3, creatinine 0.9 Labs 11/16: K 3.9, creatinine 0.9 Labs 5/17: K 3.9, creatinine 0.68 Labs 5/18: K 4, creatinine 0.65  04/23/12 ECHO EF 60-65% Lateral s' 8.9 cm/s  07/30/12 ECHO EF 60-65% Lateral s' 8.3 cm/s  09/11/12 ECHO EF 60-65% Lateral s' 10.3 cm/s  12/22/12 ECHO 55-60% Lateral S' 9.8 RV mildly dilated  07/01/13 ECHO 55-60%, lateral s' 9.79, mild RV dilation, grade II DD 3/15 ECHO 55%, mild MR, lateral s' 9.6, GLS -19.2% 03/02/14 ECHO EF 55-60% Lateral S' 9.4 GLS - 21.9  11/15 ECHO EF 60-65%, lateral S' 6.8, GLS -17.2%, mild RV dilation with normal systolic function, mild MR.  2/16 ECHO EF 60-65%, lateral S' 14.4, GLS -62.2%, normal diastolic function, mild RV dilation with normal RV systolic function.  8/16 ECHO EF 60-65%, mild LVH, normal RV size and systolic function, lateral s' 13.2, GLS -17%.  11/16 ECHO EF 60-65% Lateral S' 10.2, GLS -20.2% 5/17 ECHO EF 60-65%, mild LVH,  lateral s' 12.2, GLS -20.8%, grade II diastolic dysfunction, normal RV. 9/17 ECHO EF 29-79%, normal diastolic function, GLS -89.2% 12/17 ECHO EF 55-60%, moderate diastolic dysfunction, GLS -20.1%, normal RV size and systolic function 1/19 ECHO EF 55-60%, GLS -41.7%, normal diastolic function, normal RV size and systolic function.    ROS: All systems negative except as listed in HPI, PMH and Problem List.  Past Medical History:  Diagnosis Date  . Breast cancer ([place])    mets to liver and lung  . Breast cancer metastasized to multiple sites ([place]) 02/26/2013  . History of chemotherapy Feb. 2006   taxotere/herceptin/carboplatin  . Hypertension   . Neuropathy   . Radiation 07/31/2006   left upper chest  . Radiation 06/17/2006-06/27/2006   6480 cGy bilat. chest wall  . SVC syndrome   . Thrombosis     Current Outpatient Prescriptions  Medication Sig Dispense Refill  . acetaminophen (TYLENOL) 500 MG tablet Take 1,000 mg by mouth every 6 (six) hours as needed for mild pain or fever.     Marland Kitchen albuterol (PROVENTIL HFA;VENTOLIN HFA) 108 (90 BASE) MCG/ACT inhaler Inhale 2 puffs into the lungs every 6 (six) hours as needed for wheezing. 1 Inhaler 5  . ALPRAZolam (XANAX) 1 MG tablet TAKE 1 TABLET BY MOUTH THREE TIMES DAILY AS NEEDED FOR ANXIETY 60 tablet 0  . amLODipine (NORVASC) 10 MG tablet Take 10 mg by mouth every morning.    . baclofen (LIORESAL) 10 MG tablet Take 1  tablet (10 mg total) by mouth 3 (three) times daily as needed. for muscle spams 270 tablet 3  . carvedilol (COREG) 3.125 MG tablet Take 1 tablet (3.125 mg total) by mouth 2 (two) times daily. 180 tablet 3  . diclofenac sodium (VOLTAREN) 1 % GEL Apply 2 g topically daily as needed (for pain). Apply to knees and shoulders 100 g 6  . furosemide (LASIX) 80 MG tablet Take 80 mg by mouth daily as needed for fluid.     Marland Kitchen gabapentin (NEURONTIN) 300 MG capsule TAKE 2 CAPSULES(600 MG) BY MOUTH THREE TIMES DAILY 540 capsule 0  . losartan (COZAAR)  100 MG tablet Take 100 mg by mouth every morning.    . potassium chloride (K-DUR,KLOR-CON) 10 MEQ tablet Take 4 tablets (40 meq)  when you take Lasix. 120 tablet 5  . spironolactone (ALDACTONE) 25 MG tablet TAKE 1 TABLET BY MOUTH EVERY DAY 30 tablet 3  . temazepam (RESTORIL) 30 MG capsule TAKE ONE CAPSULE BY MOUTH EVERY NIGHT AT BEDTIME AS NEEDED FOR SLEEP 30 capsule 2  . warfarin (COUMADIN) 5 MG tablet TAKE 1 TABLET(5 MG) BY MOUTH DAILY 90 tablet 0   No current facility-administered medications for this encounter.    Facility-Administered Medications Ordered in Other Encounters  Medication Dose Route Frequency Provider Last Rate Last Dose  . sodium chloride flush (NS) 0.9 % injection 10 mL  10 mL Intravenous PRN Magrinat, Virgie Dad, MD   10 mL at 12/15/15 1200    Vitals:   01/14/17 1047  BP: (!) 156/94  Pulse: 61  SpO2: 100%  Weight: 293 lb 12 oz (133.2 kg)  Height: _0  (1.6 m)    PHYSICAL EXAM: General: NAD, obese. HEENT: normal  Neck: Thick. No JVD; Carotids 2+ bilat; no bruits. No lymphadenopathy or thryomegaly appreciated.  Cor: PMI nondisplaced. Regular rate & rhythm. No rubs, gallops or murmurs. S/P bilateral mastectomies  Lungs: CTA, normal effort Abdomen: obese. soft, NT, ND. +BS  Extremities: no cyanosis, clubbing, rash.  1+ ankle edema bilaterally.  Neuro: alert & oriented x 3, cranial nerves grossly intact. moves all 4 extremities w/o difficulty. Affect pleasant.  ASSESSMENT & PLAN: 1) L Breast Cancer s/p bilateral mastectomies:  Symptomatically stable.  Echo was reviewed today: EF and strain stable.  She will be continuing Herceptin indefinitely, now getting every 4 wks.   - Repeat echo and office followup in 6 months.  2) Suspected OSA: She has not wanted to do a sleep study.    3) HTN: BP is controlled on current regimen (high today but home measurements are good).  Loralie Champagne 01/14/2017

## 2017-01-14 NOTE — Patient Instructions (Signed)
Follow up and Echo with Dr.McLean in 6 months.

## 2017-01-25 ENCOUNTER — Other Ambulatory Visit: Payer: Self-pay | Admitting: *Deleted

## 2017-01-25 ENCOUNTER — Other Ambulatory Visit: Payer: Self-pay | Admitting: Oncology

## 2017-01-25 MED ORDER — ALPRAZOLAM 1 MG PO TABS: 1.0000 mg | ORAL_TABLET | Freq: Three times a day (TID) | ORAL | 3 refills | Status: DC | PRN

## 2017-02-06 ENCOUNTER — Ambulatory Visit: Payer: Medicare Other

## 2017-02-06 ENCOUNTER — Ambulatory Visit (HOSPITAL_BASED_OUTPATIENT_CLINIC_OR_DEPARTMENT_OTHER): Payer: Medicare Other

## 2017-02-06 ENCOUNTER — Ambulatory Visit (HOSPITAL_BASED_OUTPATIENT_CLINIC_OR_DEPARTMENT_OTHER): Payer: Medicare Other | Admitting: Oncology

## 2017-02-06 ENCOUNTER — Other Ambulatory Visit (HOSPITAL_BASED_OUTPATIENT_CLINIC_OR_DEPARTMENT_OTHER): Payer: Medicare Other

## 2017-02-06 VITALS — BP 152/77 | HR 70 | Temp 97.8°F | Resp 20 | Ht 63.0 in | Wt 297.7 lb

## 2017-02-06 DIAGNOSIS — C50812 Malignant neoplasm of overlapping sites of left female breast: Secondary | ICD-10-CM

## 2017-02-06 DIAGNOSIS — Z5112 Encounter for antineoplastic immunotherapy: Secondary | ICD-10-CM | POA: Diagnosis not present

## 2017-02-06 DIAGNOSIS — Z95828 Presence of other vascular implants and grafts: Secondary | ICD-10-CM

## 2017-02-06 DIAGNOSIS — C50911 Malignant neoplasm of unspecified site of right female breast: Secondary | ICD-10-CM

## 2017-02-06 DIAGNOSIS — Z171 Estrogen receptor negative status [ER-]: Secondary | ICD-10-CM

## 2017-02-06 DIAGNOSIS — C50111 Malignant neoplasm of central portion of right female breast: Secondary | ICD-10-CM

## 2017-02-06 DIAGNOSIS — C50912 Malignant neoplasm of unspecified site of left female breast: Secondary | ICD-10-CM

## 2017-02-06 DIAGNOSIS — Z86718 Personal history of other venous thrombosis and embolism: Secondary | ICD-10-CM

## 2017-02-06 DIAGNOSIS — C50919 Malignant neoplasm of unspecified site of unspecified female breast: Secondary | ICD-10-CM

## 2017-02-06 DIAGNOSIS — Z23 Encounter for immunization: Secondary | ICD-10-CM | POA: Diagnosis not present

## 2017-02-06 DIAGNOSIS — C50112 Malignant neoplasm of central portion of left female breast: Principal | ICD-10-CM

## 2017-02-06 LAB — COMPREHENSIVE METABOLIC PANEL
ALT: 15 U/L (ref 0–55)
ANION GAP: 11 meq/L (ref 3–11)
AST: 14 U/L (ref 5–34)
Albumin: 3.6 g/dL (ref 3.5–5.0)
Alkaline Phosphatase: 96 U/L (ref 40–150)
BILIRUBIN TOTAL: 0.89 mg/dL (ref 0.20–1.20)
BUN: 38.1 mg/dL — AB (ref 7.0–26.0)
CHLORIDE: 102 meq/L (ref 98–109)
CO2: 27 meq/L (ref 22–29)
CREATININE: 1.3 mg/dL — AB (ref 0.6–1.1)
Calcium: 9.6 mg/dL (ref 8.4–10.4)
EGFR: 50 mL/min/{1.73_m2} — ABNORMAL LOW (ref >90)
GLUCOSE: 110 mg/dL (ref 70–140)
Potassium: 4 mEq/L (ref 3.5–5.1)
Sodium: 140 mEq/L (ref 136–145)
TOTAL PROTEIN: 8.2 g/dL (ref 6.4–8.3)

## 2017-02-06 LAB — PROTIME-INR
INR: 1.9 — ABNORMAL LOW (ref 2.00–3.50)
Protime: 22.8 Seconds — ABNORMAL HIGH (ref 10.6–13.4)

## 2017-02-06 LAB — CBC WITH DIFFERENTIAL/PLATELET
BASO%: 0.3 % (ref 0.0–2.0)
Basophils Absolute: 0 10*3/uL (ref 0.0–0.1)
EOS%: 1 % (ref 0.0–7.0)
Eosinophils Absolute: 0.1 10*3/uL (ref 0.0–0.5)
HCT: 35.2 % (ref 34.8–46.6)
HGB: 11.9 g/dL (ref 11.6–15.9)
LYMPH#: 2.4 10*3/uL (ref 0.9–3.3)
LYMPH%: 25.1 % (ref 14.0–49.7)
MCH: 27.3 pg (ref 25.1–34.0)
MCHC: 33.8 g/dL (ref 31.5–36.0)
MCV: 80.7 fL (ref 79.5–101.0)
MONO#: 0.7 10*3/uL (ref 0.1–0.9)
MONO%: 7.3 % (ref 0.0–14.0)
NEUT%: 66.3 % (ref 38.4–76.8)
NEUTROS ABS: 6.3 10*3/uL (ref 1.5–6.5)
PLATELETS: 232 10*3/uL (ref 145–400)
RBC: 4.36 10*6/uL (ref 3.70–5.45)
RDW: 15.8 % — ABNORMAL HIGH (ref 11.2–14.5)
WBC: 9.6 10*3/uL (ref 3.9–10.3)

## 2017-02-06 MED ORDER — SODIUM CHLORIDE 0.9 % IV SOLN
Freq: Once | INTRAVENOUS | Status: AC
  Administered 2017-02-06: 10:00:00 via INTRAVENOUS

## 2017-02-06 MED ORDER — LORAZEPAM 2 MG/ML IJ SOLN
1.0000 mg | Freq: Once | INTRAMUSCULAR | Status: AC | PRN
Start: 2017-02-06 — End: 2017-02-06
  Administered 2017-02-06: 1 mg via INTRAVENOUS

## 2017-02-06 MED ORDER — DIPHENHYDRAMINE HCL 25 MG PO CAPS
25.0000 mg | ORAL_CAPSULE | Freq: Once | ORAL | Status: AC
  Administered 2017-02-06: 25 mg via ORAL

## 2017-02-06 MED ORDER — PNEUMOCOCCAL 13-VAL CONJ VACC IM SUSP
0.5000 mL | Freq: Once | INTRAMUSCULAR | Status: AC
  Administered 2017-02-06: 0.5 mL via INTRAMUSCULAR
  Filled 2017-02-06: qty 0.5

## 2017-02-06 MED ORDER — TRASTUZUMAB CHEMO 150 MG IV SOLR
798.0000 mg | Freq: Once | INTRAVENOUS | Status: AC
  Administered 2017-02-06: 798 mg via INTRAVENOUS
  Filled 2017-02-06: qty 38

## 2017-02-06 MED ORDER — DIPHENHYDRAMINE HCL 25 MG PO CAPS
ORAL_CAPSULE | ORAL | Status: AC
  Filled 2017-02-06: qty 1

## 2017-02-06 MED ORDER — SODIUM CHLORIDE 0.9 % IJ SOLN
10.0000 mL | INTRAMUSCULAR | Status: DC | PRN
  Administered 2017-02-06: 10 mL via INTRAVENOUS
  Filled 2017-02-06: qty 10

## 2017-02-06 MED ORDER — ACETAMINOPHEN 325 MG PO TABS
ORAL_TABLET | ORAL | Status: AC
  Filled 2017-02-06: qty 2

## 2017-02-06 MED ORDER — HEPARIN SOD (PORK) LOCK FLUSH 100 UNIT/ML IV SOLN
500.0000 [IU] | Freq: Once | INTRAVENOUS | Status: AC | PRN
  Administered 2017-02-06: 500 [IU]
  Filled 2017-02-06: qty 5

## 2017-02-06 MED ORDER — LORAZEPAM 2 MG/ML IJ SOLN
INTRAMUSCULAR | Status: AC
  Filled 2017-02-06: qty 1

## 2017-02-06 MED ORDER — ACETAMINOPHEN 325 MG PO TABS
650.0000 mg | ORAL_TABLET | Freq: Once | ORAL | Status: AC
  Administered 2017-02-06: 650 mg via ORAL

## 2017-02-06 MED ORDER — SODIUM CHLORIDE 0.9 % IJ SOLN
10.0000 mL | INTRAMUSCULAR | Status: DC | PRN
  Administered 2017-02-06: 10 mL
  Filled 2017-02-06: qty 10

## 2017-02-06 NOTE — Patient Instructions (Signed)

## 2017-02-06 NOTE — Progress Notes (Signed)
[name]  [phone]) [phone] [phone]) [phone]  OFFICE PROGRESS NOTE  ID: [name]   DOB: 10-May-1951  MR#: [id number]  HAL#:[id number]  PCP: Ernestene Kiel, MD GYN:  SU:  OTHER MD: Pierre Bali, Johnnette Gourd, Wendee Copp  CC: Metastatic HER-2 positive, estrogen receptor negative breast cancer  CURRENT TREATMENT: Trastuzumab   INTERVAL HISTORY: [name] returns today for follow-up and treatment of her stage IV HER-2 positive estrogen receptor negative breast cancer accompanied by her husband.. She continues on trastuzumab which she receives every 4 weeks. She tolerates this with no side effects that she is aware of.  Her most recent echocardiogram 01/14/2017 showed an ejection fraction in the 55-60% range  She is also on lifelong coumadinization for her history of superior vena cava syndrome. INR today is 1.90.   REVIEW OF SYSTEMS: [name] is "doing everything except climbing trees". She tells me her primary care physician has suggested she receive a Pneumovax and she is reluctant to do this. I encouraged her to proceed with that. The other new development according to [name] is a 3 of her relatives have been diagnosed with cancer within the last 2 months. She tells me there are at least 6 family members with breast cancer. She has discussed genetics testing in the past but has always been reluctant to proceed. A detailed review of systems today was otherwise stable  BREAST CANCER HISTORY:   From the earlier summary:  [name] is 66 years old [name].  This woman has been in good health all of her life.  She noted a swelling and discomfort in her right breast in June 2004.  She was seen in the Emergency Room in [place] and was treated for mastitis.  She was treated for a number of months with mastitis and the swelling did not get better. She was given hydrocodone and Cipro.  Finally, the swelling did get better  and ultimately the nipple became retracted and she noticed some dimpling in her skin. She had a mammogram in July of 2004 in [place] with subsequent mammogram on May 27, 2003, by Dr. Isaiah Blakes.  Mammogram done on September 30 showed marked increased density in the left breast.  Biopsy was performed the same day.  It was noted at the 12 o'clock position, deep in the breast was a focal hypoechoic mass, at least 3.5 cm in diameter.  Biopsy did in fact show invasive in situ mammary carcinoma. This was felt to be both at least intermediate, high grade.  No definite lymphovascular invasion was identified. ER and PR negative, Her2 testing positive. [name] continues to have pain in her breast.  She continues to take hydrocodone a number of times a day.  She has been seen by Dr. Rosana Hoes, who felt that neoadjuvant chemotherapy would be required.    Initial staging studies showed evidence of liver and lung mets.   Patient also has evidence of bone lesions. Patient started neoadjuvant chemotherapy, Taxotere/Carbo/Herceptin in October 2004.   Patient had a CT scan in December 2004 which demonstrated extensive clot in the SVC innominate vein, bilateral jugular vein and  She was started on anticoagulation therapy. She received a total of 6 cycles of Taxotere/Carbo/Herceptin, completed in April 2005."  Patient has been on  trastuzumab continued indefinitely; has also received lapatinib and capecitabine for variable intervals in 2007-2008. Most recent echo 12/01/2013 showed an ejection fraction of 55%. She is status post bilateral mastectomies with bilateral axillary lymph node dissection  12/07/2004, showing (a) on the right, a mypT1c ypN1 invasive ductal carcinoma, grade 3, estrogen and progesterone receptor negative, HER-2 positive, with an MIB-1 of 31% (b) on the left, ypT2 ypN1 invasive ductal carcinoma, grade 2, estrogen and progesterone receptor negative, HER-2 positive, with an MIB-1 of 35%. She is Status post  radiation June through July of 2006, to the right chest wall, left chest wall, bilateral supraclavicular fossae, and bilateral axillary boosts; with additional radiation to the right and left chest walls and the central chest wall completed November of 2007. She is status post ixempra x9 completed August of 2009. 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 Patient has chronic pain, with negative PET scan 08/24/2013 (no evidence of active cancer). On Neurontin and Tramadol therapy.  Her subsequent history is as detailed below  PAST MEDICAL HISTORY: Past Medical History:  Diagnosis Date  . Breast cancer ([place])    mets to liver and lung  . Breast cancer metastasized to multiple sites ([place]) 02/26/2013  . History of chemotherapy Feb. 2006   taxotere/herceptin/carboplatin  . Hypertension   . Neuropathy   . Radiation 07/31/2006   left upper chest  . Radiation 06/17/2006-06/27/2006   6480 cGy bilat. chest wall  . SVC syndrome   . Thrombosis     PAST SURGICAL HISTORY: Past Surgical History:  Procedure Laterality Date  . ANKLE SURGERY    . BACK SURGERY    . CHOLECYSTECTOMY  1989  . MASTECTOMY Bilateral   . PERIPHERALLY INSERTED CENTRAL CATHETER INSERTION    . TUBAL LIGATION  1986    FAMILY HISTORY Family History  Problem Relation Age of Onset  . Heart failure Father   . Cancer Father        Prostate cancer  . Heart failure Brother   . Cancer Brother        Prostate cancer  . Diabetes Maternal Aunt    She had three brothers, one died of gunshot wound, one of complications of diabetes mellitus and one of myocardial infarction.  She has no sisters.  Mother died of complications of brain metastasis in 87.  Father has had a myocardial infarction in 1999.  No history of breast or ovarian cancer in the family.    GYNECOLOGIC HISTORY:   Menarche at age 66.  Gravida 3, para 3.  First live birth at age 66.   No history of breast feeding. No history of hormonal replacement therapy.   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 Her husband used to work as a Art gallery manager, but is now retired. She has three children, Monette who lives in [place] and works as a Hydrographic surveyor, Financial risk analyst who lives in [place] and works as a Administrator, and [place]field who lives in [place] and also works as a Hydrographic surveyor. The patient has 12 grandchildren and 4 great-grandchildren. She attends a [employer]. Very involved with school kids.  ADVANCED DIRECTIVES:  Not in place  HEALTH MAINTENANCE: (Updated 06/12/2013) Social History  Substance Use Topics  . Smoking status: Never Smoker  . Smokeless tobacco: Never Used  . Alcohol use Yes     Comment: occasional     Colonoscopy: Never and "I don't want one"  PAP:  1987  Bone density:  Never  Lipid panel:  Not on file    Allergies  Allergen Reactions  .  Penicillins Hives    PATIENT HAS TOLERATED CEPHALOSPORINGS  . Adhesive [Tape] Other (See Comments)    Tears skin     Current Outpatient Prescriptions  Medication Sig Dispense Refill  . acetaminophen (TYLENOL) 500 MG tablet Take 1,000 mg by mouth every 6 (six) hours as needed for mild pain or fever.     Marland Kitchen albuterol (PROVENTIL HFA;VENTOLIN HFA) 108 (90 BASE) MCG/ACT inhaler Inhale 2 puffs into the lungs every 6 (six) hours as needed for wheezing. 1 Inhaler 5  . ALPRAZolam (XANAX) 1 MG tablet Take 1 tablet (1 mg total) by mouth 3 (three) times daily as needed. for anxiety 60 tablet 3  . amLODipine (NORVASC) 10 MG tablet Take 10 mg by mouth every morning.    . baclofen (LIORESAL) 10 MG tablet Take 1 tablet (10 mg total) by mouth 3 (three) times daily as needed. for muscle spams 270 tablet 3  . carvedilol (COREG) 3.125 MG tablet Take 1 tablet (3.125 mg total) by mouth 2 (two) times daily. 180 tablet 3  . diclofenac sodium (VOLTAREN) 1 % GEL  Apply 2 g topically daily as needed (for pain). Apply to knees and shoulders 100 g 6  . furosemide (LASIX) 80 MG tablet Take 80 mg by mouth daily as needed for fluid.     Marland Kitchen gabapentin (NEURONTIN) 300 MG capsule TAKE 2 CAPSULES(600 MG) BY MOUTH THREE TIMES DAILY 540 capsule 0  . losartan (COZAAR) 100 MG tablet Take 100 mg by mouth every morning.    . potassium chloride (K-DUR,KLOR-CON) 10 MEQ tablet Take 4 tablets (40 meq)  when you take Lasix. 120 tablet 5  . spironolactone (ALDACTONE) 25 MG tablet TAKE 1 TABLET BY MOUTH EVERY DAY 30 tablet 3  . temazepam (RESTORIL) 30 MG capsule TAKE ONE CAPSULE BY MOUTH EVERY NIGHT AT BEDTIME AS NEEDED FOR SLEEP 30 capsule 2  . warfarin (COUMADIN) 5 MG tablet TAKE 1 TABLET(5 MG) BY MOUTH DAILY 90 tablet 0   No current facility-administered medications for this visit.    Facility-Administered Medications Ordered in Other Visits  Medication Dose Route Frequency Provider Last Rate Last Dose  . sodium chloride 0.9 % injection 10 mL  10 mL Intravenous PRN Niomi Valent, Yolanda Dad, MD   10 mL at 02/06/17 0840  . sodium chloride flush (NS) 0.9 % injection 10 mL  10 mL Intravenous PRN Ermalee Mealy, Yolanda Dad, MD   10 mL at 12/15/15 1200    OBJECTIVE:  Morbidly obese African American woman In no acute distress Vitals:   02/06/17 0850  BP: (!) 152/77  Pulse: 70  Resp: 20  Temp: 97.8 F (36.6 C)     Body mass index is 52.74 kg/m.    ECOG FS: 1 Filed Weights   02/06/17 0850  Weight: 297 lb 11.2 oz (135 kg)   Sclerae unicteric, pupils round and equal Oropharynx clear and moist No cervical or supraclavicular adenopathy Lungs no rales or rhonchi Heart regular rate and rhythm Abd soft, obese, nontender, positive bowel sounds MSK no focal spinal tenderness, no upper extremity lymphedema Neuro: nonfocal, well oriented, appropriate affect Breasts: Status post bilateral mastectomies. There is no evidence of chest wall recurrence. Both axillae are benign.    LAB  RESULTS: Lab Results  Component Value Date   WBC 9.6 02/06/2017   NEUTROABS 6.3 02/06/2017   HGB 11.9 02/06/2017   HCT 35.2 02/06/2017   MCV 80.7 02/06/2017   PLT 232 02/06/2017      Chemistry  Component Value Date/Time   NA 140 02/06/2017 0812   K 4.0 02/06/2017 0812   CL 107 12/09/2014 0940   CL 104 01/30/2013 0850   CO2 27 02/06/2017 0812   BUN 38.1 (H) 02/06/2017 0812   CREATININE 1.3 (H) 02/06/2017 0812      Component Value Date/Time   CALCIUM 9.6 02/06/2017 0812   ALKPHOS 96 02/06/2017 0812   AST 14 02/06/2017 0812   ALT 15 02/06/2017 0812   BILITOT 0.89 02/06/2017 0812     STUDIES: Echocardiogram results discussed with patient and copy given to her  ASSESSMENT: 66 y.o.  [place], [place], woman  (1)  with a history of inflammatory right breast cancer metastatic at presentation September 2004 with involvement of liver and bone, HER-2 positive, estrogen and progesterone receptor negative  (2) treated with carboplatin, docetaxel and Herceptin x 6 completed April 2005  (3) trastuzumab continued indefinitely;   (a) has also received lapatinib and capecitabine for variable intervals in 2007-2008.  (b) Most recent echo 012/06/2016 showed an ejection fraction of 55-60 %  To be performed every 6 months   (4) status post bilateral mastectomies with bilateral axillary lymph node dissection 12/07/2004, showing  (a) on the right, a mypT1c ypN1 invasive ductal carcinoma, grade 3, estrogen and progesterone receptor negative, HER-2 positive, with an MIB-1 of 31%  (b) on the left, ypT2 ypN1 invasive ductal carcinoma, grade 2, estrogen and progesterone receptor negative, HER-2 positive, with an MIB-1 of 35%.  (5)  Status post radiation June through July of 2006, to the right chest wall, left chest wall, bilateral supraclavicular fossae, and bilateral axillary boosts; with additional radiation to the right and left chest walls and the central chest wall completed  November of 2007  (6) status post Ixempra x9 completed August of 2009.  (7) history of superior vena caval syndrome, on life long anticoagulation   (8)  History of chemotherapy-induced neuropathy.   (9)  chronic pain, with negative PET scan 08/24/2013 (no evidence of active cancer). On Neurontin and Tramadol  (a) repeat PET scan December 2016 again negative.  (10) right upper extremity cellulitis, no bacteremia; treated with cephalexin /doxycycline for 2 weeks, with resolution   PLAN: Samah is now 12 years out from definitive surgery for her breast cancer with no evidence of disease activity. This is very favorable.  She does have metastatic disease and so is technically not curable. We are continuing trastuzumab indefinitely. She receives that every 4 weeks. She will receive a dose today. Her next dose will be July 5 and then we will go back to Wednesdays after that.  I am seeing her at just about every 3 months. Her next visit with me will be late August.  Her INR was a little bit off today. I am not making any changes since she is aware of her dietary indiscretion. Her INR has been very stable to this point.  She tells me several family members have been diagnosed with cancer including breast cancer. Given the history she gives me today I think she clearly qualifies for genetics testing. We discussed that and I am setting her up for testing 02/28/2017. We should have the results by time she sees me in August.  Otherwise her next echocardiogram will be in November  She knows to call for any problems that may develop before her next visit  We are doing echocardiograms every 6 months at this point  Chauncey Cruel, MD  02/06/17 9:20 AM

## 2017-02-06 NOTE — Patient Instructions (Signed)
[facility] Discharge Instructions for Patients Receiving Chemotherapy  Today you received the following chemotherapy agents herceptin   To help prevent nausea and vomiting after your treatment, we encourage you to take your nausea medication as directed   If you develop nausea and vomiting that is not controlled by your nausea medication, call the clinic.   BELOW ARE SYMPTOMS THAT SHOULD BE REPORTED IMMEDIATELY:  *FEVER GREATER THAN 100.5 F  *CHILLS WITH OR WITHOUT FEVER  NAUSEA AND VOMITING THAT IS NOT CONTROLLED WITH YOUR NAUSEA MEDICATION  *UNUSUAL SHORTNESS OF BREATH  *UNUSUAL BRUISING OR BLEEDING  TENDERNESS IN MOUTH AND THROAT WITH OR WITHOUT PRESENCE OF ULCERS  *URINARY PROBLEMS  *BOWEL PROBLEMS  UNUSUAL RASH Items with * indicate a potential emergency and should be followed up as soon as possible.  Feel free to call the clinic you have any questions or concerns. The clinic phone number is [phone].  

## 2017-02-12 ENCOUNTER — Other Ambulatory Visit: Payer: Self-pay

## 2017-02-12 DIAGNOSIS — C50919 Malignant neoplasm of unspecified site of unspecified female breast: Secondary | ICD-10-CM

## 2017-02-12 MED ORDER — TEMAZEPAM 30 MG PO CAPS: ORAL_CAPSULE | ORAL | 2 refills | Status: DC

## 2017-02-15 DIAGNOSIS — M170 Bilateral primary osteoarthritis of knee: Secondary | ICD-10-CM | POA: Diagnosis not present

## 2017-02-28 ENCOUNTER — Other Ambulatory Visit: Payer: Self-pay | Admitting: *Deleted

## 2017-02-28 ENCOUNTER — Other Ambulatory Visit (HOSPITAL_BASED_OUTPATIENT_CLINIC_OR_DEPARTMENT_OTHER): Payer: Medicare Other

## 2017-02-28 ENCOUNTER — Ambulatory Visit (HOSPITAL_BASED_OUTPATIENT_CLINIC_OR_DEPARTMENT_OTHER): Payer: Medicare Other

## 2017-02-28 ENCOUNTER — Ambulatory Visit: Payer: Medicare Other

## 2017-02-28 VITALS — BP 134/97 | HR 64 | Temp 98.4°F | Resp 18

## 2017-02-28 DIAGNOSIS — Z5112 Encounter for antineoplastic immunotherapy: Secondary | ICD-10-CM | POA: Diagnosis not present

## 2017-02-28 DIAGNOSIS — C50919 Malignant neoplasm of unspecified site of unspecified female breast: Secondary | ICD-10-CM

## 2017-02-28 DIAGNOSIS — Z86718 Personal history of other venous thrombosis and embolism: Secondary | ICD-10-CM

## 2017-02-28 DIAGNOSIS — C50812 Malignant neoplasm of overlapping sites of left female breast: Secondary | ICD-10-CM

## 2017-02-28 DIAGNOSIS — C50911 Malignant neoplasm of unspecified site of right female breast: Secondary | ICD-10-CM | POA: Diagnosis not present

## 2017-02-28 DIAGNOSIS — Z95828 Presence of other vascular implants and grafts: Secondary | ICD-10-CM

## 2017-02-28 DIAGNOSIS — C50912 Malignant neoplasm of unspecified site of left female breast: Secondary | ICD-10-CM

## 2017-02-28 LAB — CBC WITH DIFFERENTIAL/PLATELET
BASO%: 1.1 % (ref 0.0–2.0)
Basophils Absolute: 0.1 10*3/uL (ref 0.0–0.1)
EOS ABS: 0 10*3/uL (ref 0.0–0.5)
EOS%: 0.4 % (ref 0.0–7.0)
HEMATOCRIT: 36.5 % (ref 34.8–46.6)
HGB: 12.4 g/dL (ref 11.6–15.9)
LYMPH%: 22.9 % (ref 14.0–49.7)
MCH: 27.7 pg (ref 25.1–34.0)
MCHC: 34 g/dL (ref 31.5–36.0)
MCV: 81.5 fL (ref 79.5–101.0)
MONO#: 0.8 10*3/uL (ref 0.1–0.9)
MONO%: 6.8 % (ref 0.0–14.0)
NEUT%: 68.8 % (ref 38.4–76.8)
NEUTROS ABS: 7.9 10*3/uL — AB (ref 1.5–6.5)
PLATELETS: 288 10*3/uL (ref 145–400)
RBC: 4.47 10*6/uL (ref 3.70–5.45)
RDW: 16 % — ABNORMAL HIGH (ref 11.2–14.5)
WBC: 11.5 10*3/uL — AB (ref 3.9–10.3)
lymph#: 2.6 10*3/uL (ref 0.9–3.3)

## 2017-02-28 LAB — PROTIME-INR
INR: 1.9 — AB (ref 2.00–3.50)
PROTIME: 22.8 s — AB (ref 10.6–13.4)

## 2017-02-28 LAB — COMPREHENSIVE METABOLIC PANEL
ALK PHOS: 83 U/L (ref 40–150)
ALT: 11 U/L (ref 0–55)
ANION GAP: 9 meq/L (ref 3–11)
AST: 11 U/L (ref 5–34)
Albumin: 3.7 g/dL (ref 3.5–5.0)
BILIRUBIN TOTAL: 0.95 mg/dL (ref 0.20–1.20)
BUN: 30.1 mg/dL — ABNORMAL HIGH (ref 7.0–26.0)
CALCIUM: 9.7 mg/dL (ref 8.4–10.4)
CO2: 25 meq/L (ref 22–29)
CREATININE: 1 mg/dL (ref 0.6–1.1)
Chloride: 105 mEq/L (ref 98–109)
EGFR: 72 mL/min/{1.73_m2} — AB (ref >90)
Glucose: 93 mg/dl (ref 70–140)
Potassium: 4.1 mEq/L (ref 3.5–5.1)
Sodium: 139 mEq/L (ref 136–145)
TOTAL PROTEIN: 8.2 g/dL (ref 6.4–8.3)

## 2017-02-28 MED ORDER — TEMAZEPAM 30 MG PO CAPS: ORAL_CAPSULE | ORAL | 2 refills | Status: DC

## 2017-02-28 MED ORDER — SODIUM CHLORIDE 0.9 % IJ SOLN
10.0000 mL | INTRAMUSCULAR | Status: DC | PRN
  Administered 2017-02-28: 10 mL
  Filled 2017-02-28: qty 10

## 2017-02-28 MED ORDER — LORAZEPAM 2 MG/ML IJ SOLN
1.0000 mg | Freq: Once | INTRAMUSCULAR | Status: AC | PRN
  Administered 2017-02-28: 1 mg via INTRAVENOUS

## 2017-02-28 MED ORDER — SODIUM CHLORIDE 0.9 % IJ SOLN
10.0000 mL | INTRAMUSCULAR | Status: DC | PRN
  Administered 2017-02-28: 10 mL via INTRAVENOUS
  Filled 2017-02-28: qty 10

## 2017-02-28 MED ORDER — ALPRAZOLAM 1 MG PO TABS: 1.0000 mg | ORAL_TABLET | Freq: Three times a day (TID) | ORAL | 3 refills | Status: DC | PRN

## 2017-02-28 MED ORDER — ACETAMINOPHEN 325 MG PO TABS
ORAL_TABLET | ORAL | Status: AC
  Filled 2017-02-28: qty 2

## 2017-02-28 MED ORDER — LORAZEPAM 2 MG/ML IJ SOLN
INTRAMUSCULAR | Status: AC
  Filled 2017-02-28: qty 1

## 2017-02-28 MED ORDER — TRASTUZUMAB CHEMO 150 MG IV SOLR
798.0000 mg | Freq: Once | INTRAVENOUS | Status: AC
  Administered 2017-02-28: 798 mg via INTRAVENOUS
  Filled 2017-02-28: qty 38

## 2017-02-28 MED ORDER — DIPHENHYDRAMINE HCL 25 MG PO CAPS
ORAL_CAPSULE | ORAL | Status: AC
  Filled 2017-02-28: qty 1

## 2017-02-28 MED ORDER — SODIUM CHLORIDE 0.9 % IV SOLN
Freq: Once | INTRAVENOUS | Status: AC
  Administered 2017-02-28: 13:00:00 via INTRAVENOUS

## 2017-02-28 MED ORDER — DIPHENHYDRAMINE HCL 25 MG PO CAPS
25.0000 mg | ORAL_CAPSULE | Freq: Once | ORAL | Status: AC
  Administered 2017-02-28: 25 mg via ORAL

## 2017-02-28 MED ORDER — HEPARIN SOD (PORK) LOCK FLUSH 100 UNIT/ML IV SOLN
500.0000 [IU] | Freq: Once | INTRAVENOUS | Status: AC | PRN
  Administered 2017-02-28: 500 [IU]
  Filled 2017-02-28: qty 5

## 2017-02-28 MED ORDER — ACETAMINOPHEN 325 MG PO TABS
650.0000 mg | ORAL_TABLET | Freq: Once | ORAL | Status: AC
  Administered 2017-02-28: 650 mg via ORAL

## 2017-02-28 NOTE — Patient Instructions (Signed)
[facility] Discharge Instructions for Patients Receiving Chemotherapy  Today you received the following chemotherapy agents:  Herceptin  To help prevent nausea and vomiting after your treatment, we encourage you to take your nausea medication as prescribed.   If you develop nausea and vomiting that is not controlled by your nausea medication, call the clinic.   BELOW ARE SYMPTOMS THAT SHOULD BE REPORTED IMMEDIATELY:  *FEVER GREATER THAN 100.5 F  *CHILLS WITH OR WITHOUT FEVER  NAUSEA AND VOMITING THAT IS NOT CONTROLLED WITH YOUR NAUSEA MEDICATION  *UNUSUAL SHORTNESS OF BREATH  *UNUSUAL BRUISING OR BLEEDING  TENDERNESS IN MOUTH AND THROAT WITH OR WITHOUT PRESENCE OF ULCERS  *URINARY PROBLEMS  *BOWEL PROBLEMS  UNUSUAL RASH Items with * indicate a potential emergency and should be followed up as soon as possible.  Feel free to call the clinic you have any questions or concerns. The clinic phone number is [phone].  Please show the CHEMO ALERT CARD at check-in to the Emergency Department and triage nurse.   

## 2017-03-06 ENCOUNTER — Ambulatory Visit: Payer: Medicare Other

## 2017-03-06 ENCOUNTER — Other Ambulatory Visit: Payer: Medicare Other

## 2017-03-24 ENCOUNTER — Other Ambulatory Visit: Payer: Self-pay | Admitting: Oncology

## 2017-03-27 ENCOUNTER — Other Ambulatory Visit: Payer: Self-pay | Admitting: Hematology and Oncology

## 2017-03-27 ENCOUNTER — Ambulatory Visit (HOSPITAL_BASED_OUTPATIENT_CLINIC_OR_DEPARTMENT_OTHER): Payer: Medicare Other | Admitting: Adult Health

## 2017-03-27 ENCOUNTER — Encounter: Payer: Self-pay | Admitting: Adult Health

## 2017-03-27 ENCOUNTER — Ambulatory Visit (HOSPITAL_BASED_OUTPATIENT_CLINIC_OR_DEPARTMENT_OTHER): Payer: Medicare Other

## 2017-03-27 ENCOUNTER — Ambulatory Visit: Payer: Medicare Other

## 2017-03-27 ENCOUNTER — Ambulatory Visit (HOSPITAL_COMMUNITY)
Admission: RE | Admit: 2017-03-27 | Discharge: 2017-03-27 | Disposition: A | Discharge status: Home / Self Care | Payer: Medicare Other | Source: Ambulatory Visit | Attending: Hematology and Oncology | Admitting: Hematology and Oncology

## 2017-03-27 ENCOUNTER — Other Ambulatory Visit (HOSPITAL_BASED_OUTPATIENT_CLINIC_OR_DEPARTMENT_OTHER): Payer: Medicare Other

## 2017-03-27 ENCOUNTER — Other Ambulatory Visit: Payer: Self-pay | Admitting: *Deleted

## 2017-03-27 VITALS — BP 135/77 | HR 55 | Temp 98.7°F | Resp 18 | Ht 63.0 in | Wt 299.6 lb

## 2017-03-27 VITALS — BP 148/88 | HR 84 | Temp 98.2°F | Resp 18

## 2017-03-27 DIAGNOSIS — C50911 Malignant neoplasm of unspecified site of right female breast: Secondary | ICD-10-CM

## 2017-03-27 DIAGNOSIS — M25511 Pain in right shoulder: Secondary | ICD-10-CM | POA: Insufficient documentation

## 2017-03-27 DIAGNOSIS — Z170 Estrogen receptor positive status [ER+]: Secondary | ICD-10-CM

## 2017-03-27 DIAGNOSIS — Z86718 Personal history of other venous thrombosis and embolism: Secondary | ICD-10-CM

## 2017-03-27 DIAGNOSIS — C50919 Malignant neoplasm of unspecified site of unspecified female breast: Secondary | ICD-10-CM | POA: Insufficient documentation

## 2017-03-27 DIAGNOSIS — Z7901 Long term (current) use of anticoagulants: Secondary | ICD-10-CM

## 2017-03-27 DIAGNOSIS — Z5112 Encounter for antineoplastic immunotherapy: Secondary | ICD-10-CM | POA: Diagnosis present

## 2017-03-27 DIAGNOSIS — C7989 Secondary malignant neoplasm of other specified sites: Secondary | ICD-10-CM | POA: Diagnosis not present

## 2017-03-27 DIAGNOSIS — W19XXXA Unspecified fall, initial encounter: Secondary | ICD-10-CM | POA: Insufficient documentation

## 2017-03-27 DIAGNOSIS — W19XXXS Unspecified fall, sequela: Secondary | ICD-10-CM

## 2017-03-27 DIAGNOSIS — C50912 Malignant neoplasm of unspecified site of left female breast: Secondary | ICD-10-CM

## 2017-03-27 DIAGNOSIS — R937 Abnormal findings on diagnostic imaging of other parts of musculoskeletal system: Secondary | ICD-10-CM | POA: Insufficient documentation

## 2017-03-27 DIAGNOSIS — Z95828 Presence of other vascular implants and grafts: Secondary | ICD-10-CM

## 2017-03-27 DIAGNOSIS — S4991XA Unspecified injury of right shoulder and upper arm, initial encounter: Secondary | ICD-10-CM | POA: Diagnosis not present

## 2017-03-27 LAB — COMPREHENSIVE METABOLIC PANEL
ALK PHOS: 68 U/L (ref 40–150)
ALT: 19 U/L (ref 0–55)
AST: 11 U/L (ref 5–34)
Albumin: 3.4 g/dL — ABNORMAL LOW (ref 3.5–5.0)
Anion Gap: 11 mEq/L (ref 3–11)
BILIRUBIN TOTAL: 0.67 mg/dL (ref 0.20–1.20)
BUN: 31.7 mg/dL — ABNORMAL HIGH (ref 7.0–26.0)
CALCIUM: 9.5 mg/dL (ref 8.4–10.4)
CO2: 25 mEq/L (ref 22–29)
Chloride: 104 mEq/L (ref 98–109)
Creatinine: 1.1 mg/dL (ref 0.6–1.1)
EGFR: 61 mL/min/{1.73_m2} — AB (ref >90)
GLUCOSE: 130 mg/dL (ref 70–140)
Potassium: 4 mEq/L (ref 3.5–5.1)
SODIUM: 140 meq/L (ref 136–145)
TOTAL PROTEIN: 7.2 g/dL (ref 6.4–8.3)

## 2017-03-27 LAB — CBC WITH DIFFERENTIAL/PLATELET
BASO%: 0.2 % (ref 0.0–2.0)
Basophils Absolute: 0 10*3/uL (ref 0.0–0.1)
EOS ABS: 0.1 10*3/uL (ref 0.0–0.5)
EOS%: 0.8 % (ref 0.0–7.0)
HEMATOCRIT: 33.6 % — AB (ref 34.8–46.6)
HEMOGLOBIN: 11.6 g/dL (ref 11.6–15.9)
LYMPH#: 2.2 10*3/uL (ref 0.9–3.3)
LYMPH%: 25.2 % (ref 14.0–49.7)
MCH: 28.6 pg (ref 25.1–34.0)
MCHC: 34.5 g/dL (ref 31.5–36.0)
MCV: 82.8 fL (ref 79.5–101.0)
MONO#: 0.7 10*3/uL (ref 0.1–0.9)
MONO%: 8.7 % (ref 0.0–14.0)
NEUT%: 65.1 % (ref 38.4–76.8)
NEUTROS ABS: 5.6 10*3/uL (ref 1.5–6.5)
PLATELETS: 210 10*3/uL (ref 145–400)
RBC: 4.06 10*6/uL (ref 3.70–5.45)
RDW: 17 % — AB (ref 11.2–14.5)
WBC: 8.5 10*3/uL (ref 3.9–10.3)

## 2017-03-27 LAB — PROTIME-INR
INR: 3.3 (ref 2.00–3.50)
Protime: 39.6 Seconds — ABNORMAL HIGH (ref 10.6–13.4)

## 2017-03-27 MED ORDER — SODIUM CHLORIDE 0.9 % IJ SOLN
10.0000 mL | INTRAMUSCULAR | Status: DC | PRN
  Administered 2017-03-27: 10 mL
  Filled 2017-03-27: qty 10

## 2017-03-27 MED ORDER — HEPARIN SOD (PORK) LOCK FLUSH 100 UNIT/ML IV SOLN
500.0000 [IU] | Freq: Once | INTRAVENOUS | Status: AC | PRN
  Administered 2017-03-27: 500 [IU]
  Filled 2017-03-27: qty 5

## 2017-03-27 MED ORDER — LORAZEPAM 2 MG/ML IJ SOLN
1.0000 mg | Freq: Once | INTRAMUSCULAR | Status: AC | PRN
  Administered 2017-03-27: 1 mg via INTRAVENOUS

## 2017-03-27 MED ORDER — SODIUM CHLORIDE 0.9 % IV SOLN
Freq: Once | INTRAVENOUS | Status: AC
  Administered 2017-03-27: 10:00:00 via INTRAVENOUS

## 2017-03-27 MED ORDER — DIPHENHYDRAMINE HCL 25 MG PO CAPS
25.0000 mg | ORAL_CAPSULE | Freq: Once | ORAL | Status: AC
  Administered 2017-03-27: 25 mg via ORAL

## 2017-03-27 MED ORDER — LORAZEPAM 2 MG/ML IJ SOLN
INTRAMUSCULAR | Status: AC
  Filled 2017-03-27: qty 1

## 2017-03-27 MED ORDER — SODIUM CHLORIDE 0.9 % IJ SOLN
10.0000 mL | INTRAMUSCULAR | Status: DC | PRN
  Administered 2017-03-27: 10 mL via INTRAVENOUS
  Filled 2017-03-27: qty 10

## 2017-03-27 MED ORDER — ACETAMINOPHEN 325 MG PO TABS
650.0000 mg | ORAL_TABLET | Freq: Once | ORAL | Status: AC
  Administered 2017-03-27: 650 mg via ORAL

## 2017-03-27 MED ORDER — TRASTUZUMAB CHEMO 150 MG IV SOLR
798.0000 mg | Freq: Once | INTRAVENOUS | Status: AC
  Administered 2017-03-27: 798 mg via INTRAVENOUS
  Filled 2017-03-27: qty 38

## 2017-03-27 MED ORDER — DIPHENHYDRAMINE HCL 25 MG PO CAPS
ORAL_CAPSULE | ORAL | Status: AC
  Filled 2017-03-27: qty 1

## 2017-03-27 MED ORDER — ACETAMINOPHEN 325 MG PO TABS
ORAL_TABLET | ORAL | Status: AC
  Filled 2017-03-27: qty 2

## 2017-03-27 NOTE — Progress Notes (Signed)
[facility]  [phone] [phone]  OFFICE PROGRESS NOTE  ID: KAYDAN WILHOITE   DOB: 04-26-1951  MR#: [id number]  DEY#:[id number]  PCP: Ernestene Kiel, MD GYN:  SU:  OTHER MD: Pierre Bali, Johnnette Gourd, Wendee Copp  CC: Metastatic HER-2 positive, estrogen receptor negative breast cancer  CURRENT TREATMENT: Trastuzumab   INTERVAL HISTORY: [name] is here today for evaluation after faling in our waiting area.  She fell at approximately 1130 this morning.  She got halfway down the hall and got it stuck in a doorway, and the next thing she knew she was on the floor.  She did not lose consciousness.  She fell on her right shoulder.  She was trying to catch herself.  She hit her head.  She hit her right forehead and she says that she did not hit it very hard.  She did have a right shoulder xray that was normal.     REVIEW OF SYSTEMS: She denies any blurred vision, double vision, dizziness, weakness, numbness, headache, nausea, vomiting, speech change, or any other concern.    BREAST CANCER HISTORY:   From the earlier summary:  [name] is 66 years old [name] female.  This woman has been in good health all of her life.  She noted a swelling and discomfort in her right breast in June 2004.  She was seen in the Emergency Room in [place] and was treated for mastitis.  She was treated for a number of months with mastitis and the swelling did not get better. She was given hydrocodone and Cipro.  Finally, the swelling did get better and ultimately the nipple became retracted and she noticed some dimpling in her skin. She had a mammogram in July of 2004 in [place] with subsequent mammogram on May 27, 2003, by Dr. Isaiah Blakes.  Mammogram done on September 30 showed marked increased density in the left breast.  Biopsy was performed the same day.  It was noted at the 12 o'clock position, deep in the breast was a focal hypoechoic  mass, at least 3.5 cm in diameter.  Biopsy did in fact show invasive in situ mammary carcinoma. This was felt to be both at least intermediate, high grade.  No definite lymphovascular invasion was identified. ER and PR negative, Her2 testing positive. [name] continues to have pain in her breast.  She continues to take hydrocodone a number of times a day.  She has been seen by Dr. Rosana Hoes, who felt that neoadjuvant chemotherapy would be required.    Initial staging studies showed evidence of liver and lung mets.   Patient also has evidence of bone lesions. Patient started neoadjuvant chemotherapy, Taxotere/Carbo/Herceptin in October 2004.   Patient had a CT scan in December 2004 which demonstrated extensive clot in the SVC innominate vein, bilateral jugular vein and  She was started on anticoagulation therapy. She received a total of 6 cycles of Taxotere/Carbo/Herceptin, completed in April 2005."  Patient has been on  trastuzumab continued indefinitely; has also received lapatinib and capecitabine for variable intervals in 2007-2008. Most recent echo 12/01/2013 showed an ejection fraction of 55%. She is status post bilateral mastectomies with bilateral axillary lymph node dissection 12/07/2004, showing (a) on the right, a mypT1c ypN1 invasive ductal carcinoma, grade 3, estrogen and progesterone receptor negative, HER-2 positive, with an MIB-1 of 31% (b) on the left, ypT2 ypN1 invasive ductal carcinoma, grade 2, estrogen and progesterone receptor negative, HER-2 positive, with an MIB-1 of 35%. She  is Status post radiation June through July of 2006, to the right chest wall, left chest wall, bilateral supraclavicular fossae, and bilateral axillary boosts; with additional radiation to the right and left chest walls and the central chest wall completed November of 2007. She is status post ixempra x9 completed August of 2009. She has history of superior vena caval syndrome, on life long anticoagulation. She has History of  chemotherapy-induced neuropathy. Patient has chronic pain, with negative PET scan 08/24/2013 (no evidence of active cancer). On Neurontin and Tramadol therapy.  Her subsequent history is as detailed below  PAST MEDICAL HISTORY: Past Medical History:  Diagnosis Date  . Breast cancer ([place])    mets to liver and lung  . Breast cancer metastasized to multiple sites ([place]) 02/26/2013  . History of chemotherapy Feb. 2006   taxotere/herceptin/carboplatin  . Hypertension   . Neuropathy   . Radiation 07/31/2006   left upper chest  . Radiation 06/17/2006-06/27/2006   6480 cGy bilat. chest wall  . SVC syndrome   . Thrombosis     PAST SURGICAL HISTORY: Past Surgical History:  Procedure Laterality Date  . ANKLE SURGERY    . BACK SURGERY    . CHOLECYSTECTOMY  1989  . MASTECTOMY Bilateral   . PERIPHERALLY INSERTED CENTRAL CATHETER INSERTION    . TUBAL LIGATION  1986    FAMILY HISTORY Family History  Problem Relation Age of Onset  . Heart failure Father   . Cancer Father        Prostate cancer  . Heart failure Brother   . Cancer Brother        Prostate cancer  . Diabetes Maternal Aunt    She had three brothers, one died of gunshot wound, one of complications of diabetes mellitus and one of myocardial infarction.  She has no sisters.  Mother died of complications of brain metastasis in 47.  Father has had a myocardial infarction in 1999.  No history of breast or ovarian cancer in the family.    GYNECOLOGIC HISTORY:   Menarche at age 66.  Gravida 3, para 3.  First live birth at age 42.  No history of breast feeding. No history of hormonal replacement therapy.   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 Her husband used to work as a Art gallery manager, but is now retired. She has three children, Monette who lives in [place] and works as a Hydrographic surveyor, Financial risk analyst who lives in [place] and works as a Administrator, and Onancock who  lives in [place] and also works as a Hydrographic surveyor. The patient has 12 grandchildren and 4 great-grandchildren. She attends a [employer]. Very involved with school kids.  ADVANCED DIRECTIVES:  Not in place  HEALTH MAINTENANCE: (Updated 06/12/2013) Social History  Substance Use Topics  . Smoking status: Never Smoker  . Smokeless tobacco: Never Used  . Alcohol use Yes     Comment: occasional     Colonoscopy: Never and "I don't want one"  PAP:  1987  Bone density:  Never  Lipid panel:  Not on file    Allergies  Allergen Reactions  . Penicillins Hives    PATIENT HAS TOLERATED CEPHALOSPORINGS  . Adhesive [Tape] Other (See Comments)    Tears skin     Current Outpatient Prescriptions  Medication Sig Dispense Refill  . acetaminophen (TYLENOL) 500 MG tablet Take 1,000 mg by mouth every 6 (six) hours as  needed for mild pain or fever.     Marland Kitchen albuterol (PROVENTIL HFA;VENTOLIN HFA) 108 (90 BASE) MCG/ACT inhaler Inhale 2 puffs into the lungs every 6 (six) hours as needed for wheezing. 1 Inhaler 5  . ALPRAZolam (XANAX) 1 MG tablet Take 1 tablet (1 mg total) by mouth 3 (three) times daily as needed. for anxiety 60 tablet 3  . amLODipine (NORVASC) 10 MG tablet Take 10 mg by mouth every morning.    . baclofen (LIORESAL) 10 MG tablet Take 1 tablet (10 mg total) by mouth 3 (three) times daily as needed. for muscle spams 270 tablet 3  . carvedilol (COREG) 3.125 MG tablet Take 1 tablet (3.125 mg total) by mouth 2 (two) times daily. 180 tablet 3  . diclofenac sodium (VOLTAREN) 1 % GEL Apply 2 g topically daily as needed (for pain). Apply to knees and shoulders 100 g 6  . furosemide (LASIX) 80 MG tablet Take 80 mg by mouth daily as needed for fluid.     Marland Kitchen gabapentin (NEURONTIN) 300 MG capsule TAKE 2 CAPSULES(600 MG) BY MOUTH THREE TIMES DAILY 540 capsule 0  . losartan (COZAAR) 100 MG tablet Take 100 mg by mouth every morning.    . potassium chloride (K-DUR,KLOR-CON) 10 MEQ tablet Take 4  tablets (40 meq)  when you take Lasix. 120 tablet 5  . spironolactone (ALDACTONE) 25 MG tablet TAKE 1 TABLET BY MOUTH EVERY DAY 30 tablet 3  . temazepam (RESTORIL) 30 MG capsule TAKE ONE CAPSULE BY MOUTH EVERY NIGHT AT BEDTIME AS NEEDED FOR SLEEP 30 capsule 2  . warfarin (COUMADIN) 5 MG tablet TAKE 1 TABLET(5 MG) BY MOUTH DAILY 90 tablet 0   No current facility-administered medications for this visit.    Facility-Administered Medications Ordered in Other Visits  Medication Dose Route Frequency Provider Last Rate Last Dose  . sodium chloride flush (NS) 0.9 % injection 10 mL  10 mL Intravenous PRN Magrinat, Virgie Dad, MD   10 mL at 12/15/15 1200    OBJECTIVE:  Vitals:   03/27/17 1303 03/27/17 1305  BP: 135/77 135/77  Pulse: (!) 55 (!) 55  Resp: 18 18  Temp: 98.7 F (37.1 C) 98.7 F (37.1 C)     Body mass index is 53.07 kg/m.    ECOG FS: 1 Filed Weights   03/27/17 1303 03/27/17 1305  Weight: 299 lb 9.6 oz (135.9 kg) 299 lb 9.6 oz (135.9 kg)  GENERAL: Patient is a well appearing female in no acute distress HEENT:  Sclerae anicteric.  Oropharynx clear and moist. No ulcerations or evidence of oropharyngeal candidiasis. Neck is supple.  NODES:  No cervical, supraclavicular, or axillary lymphadenopathy palpated.  BREAST EXAM:  Deferred. LUNGS:  Clear to auscultation bilaterally.  No wheezes or rhonchi. HEART:  Regular rate and rhythm. No murmur appreciated. ABDOMEN:  Soft, nontender.  Positive, normoactive bowel sounds. No organomegaly palpated. MSK:  No focal spinal tenderness to palpation. Limited ROM in right shoulder, unable to lift shoulder up. + tTP to right posterior rotator cuff EXTREMITIES:  No peripheral edema.  Strength 5/5 x 4 ext with exception of right shoulder due to ROM limitation, hand grips and elbow strength is 5/5 SKIN:  Clear with no obvious rashes or skin changes. No nail dyscrasia. NEURO:  Nonfocal. Well oriented.  Appropriate affect.  CN II-XII intact      LAB  RESULTS: Lab Results  Component Value Date   WBC 8.5 03/27/2017   NEUTROABS 5.6 03/27/2017   HGB 11.6 03/27/2017  HCT 33.6 (L) 03/27/2017   MCV 82.8 03/27/2017   PLT 210 03/27/2017      Chemistry      Component Value Date/Time   NA 140 03/27/2017 0916   K 4.0 03/27/2017 0916   CL 107 12/09/2014 0940   CL 104 01/30/2013 0850   CO2 25 03/27/2017 0916   BUN 31.7 (H) 03/27/2017 0916   CREATININE 1.1 03/27/2017 0916      Component Value Date/Time   CALCIUM 9.5 03/27/2017 0916   ALKPHOS 68 03/27/2017 0916   AST 11 03/27/2017 0916   ALT 19 03/27/2017 0916   BILITOT 0.67 03/27/2017 0916     STUDIES: Echocardiogram results discussed with patient and copy given to her  ASSESSMENT: 66 y.o.  [place], [place], woman  (1)  with a history of inflammatory right breast cancer metastatic at presentation September 2004 with involvement of liver and bone, HER-2 positive, estrogen and progesterone receptor negative  (2) treated with carboplatin, docetaxel and Herceptin x 6 completed April 2005  (3) trastuzumab continued indefinitely;   (a) has also received lapatinib and capecitabine for variable intervals in 2007-2008.  (b) Most recent echo 012/06/2016 showed an ejection fraction of 55-60 %  To be performed every 6 months   (4) status post bilateral mastectomies with bilateral axillary lymph node dissection 12/07/2004, showing  (a) on the right, a mypT1c ypN1 invasive ductal carcinoma, grade 3, estrogen and progesterone receptor negative, HER-2 positive, with an MIB-1 of 31%  (b) on the left, ypT2 ypN1 invasive ductal carcinoma, grade 2, estrogen and progesterone receptor negative, HER-2 positive, with an MIB-1 of 35%.  (5)  Status post radiation June through July of 2006, to the right chest wall, left chest wall, bilateral supraclavicular fossae, and bilateral axillary boosts; with additional radiation to the right and left chest walls and the central chest wall completed  November of 2007  (6) status post Ixempra x9 completed August of 2009.  (7) history of superior vena caval syndrome, on life long anticoagulation   (8)  History of chemotherapy-induced neuropathy.   (9)  chronic pain, with negative PET scan 08/24/2013 (no evidence of active cancer). On Neurontin and Tramadol  (a) repeat PET scan December 2016 again negative.  (10) right upper extremity cellulitis, no bacteremia; treated with cephalexin /doxycycline for 2 weeks, with resolution   PLAN: [name] has right shoulder pain s/p fall in our lobby.  This is unrelated to her cancer.  Xray shows some degeneration but no fracture or sign of dislocation.  She has a h/o arthralgias and uses voltaren gel and baclofen for muscle spasms.  She has these at home and will use these as prescribed.  Should the pain and ROM limitation continue she will likely need an MRI of her shoulder for evaluation of ? Tear.    I also talked to her about her head.  She very lightly hit her head.  She is on Coumadin and her INR is 3.3 today.  She is not having any concerning symptoms for a brain bleed/subdural hematoma.  Her neuro exam is normal.  I reviewed red flags with her regarding her head and when to go to the ER.  She and her husband verbalized understanding.   She knows to call us if she has any concerns whatsoever.    A total of (30) minutes of face-to-face time was spent with this patient with greater than 50% of that time in counseling and care-coordination.   Scot Dock, NP  03/27/17 1:16 PM

## 2017-03-27 NOTE — Progress Notes (Signed)
This patient upon walking out of the [facility] was found sitting on the floor in the hallway - unwitnessed by personnel.  [name] states " I just tripped over by own feet and next thing I knew I had fallen "  She states she hit her shoulder and head.  Pt is requesting to " go home - I'll be fine "  Upon further assessment she unable to raise right arm higher then 30 degrees with pain and impairment of movement. ( she has known right shoulder pain history but not impairment of movement ).  Per informing covering MD - Dr Lindi Adie and LC/NP order placed for right shoulder films - LC/NP can assess pt upon return for evaluation.

## 2017-03-27 NOTE — Patient Instructions (Signed)
[facility] Discharge Instructions for Patients Receiving Chemotherapy  Today you received the following chemotherapy agents:  Herceptin  To help prevent nausea and vomiting after your treatment, we encourage you to take your nausea medication as prescribed.   If you develop nausea and vomiting that is not controlled by your nausea medication, call the clinic.   BELOW ARE SYMPTOMS THAT SHOULD BE REPORTED IMMEDIATELY:  *FEVER GREATER THAN 100.5 F  *CHILLS WITH OR WITHOUT FEVER  NAUSEA AND VOMITING THAT IS NOT CONTROLLED WITH YOUR NAUSEA MEDICATION  *UNUSUAL SHORTNESS OF BREATH  *UNUSUAL BRUISING OR BLEEDING  TENDERNESS IN MOUTH AND THROAT WITH OR WITHOUT PRESENCE OF ULCERS  *URINARY PROBLEMS  *BOWEL PROBLEMS  UNUSUAL RASH Items with * indicate a potential emergency and should be followed up as soon as possible.  Feel free to call the clinic you have any questions or concerns. The clinic phone number is [phone].  Please show the CHEMO ALERT CARD at check-in to the Emergency Department and triage nurse.   

## 2017-03-27 NOTE — Patient Instructions (Signed)

## 2017-03-29 DIAGNOSIS — I10 Essential (primary) hypertension: Secondary | ICD-10-CM | POA: Diagnosis not present

## 2017-03-29 DIAGNOSIS — M170 Bilateral primary osteoarthritis of knee: Secondary | ICD-10-CM | POA: Diagnosis not present

## 2017-03-29 DIAGNOSIS — M7989 Other specified soft tissue disorders: Secondary | ICD-10-CM | POA: Diagnosis not present

## 2017-03-29 DIAGNOSIS — G629 Polyneuropathy, unspecified: Secondary | ICD-10-CM | POA: Diagnosis not present

## 2017-04-01 DIAGNOSIS — M25511 Pain in right shoulder: Secondary | ICD-10-CM | POA: Diagnosis not present

## 2017-04-04 DIAGNOSIS — M25511 Pain in right shoulder: Secondary | ICD-10-CM | POA: Diagnosis not present

## 2017-04-04 DIAGNOSIS — M25611 Stiffness of right shoulder, not elsewhere classified: Secondary | ICD-10-CM | POA: Diagnosis not present

## 2017-04-08 ENCOUNTER — Other Ambulatory Visit: Payer: Self-pay | Admitting: *Deleted

## 2017-04-08 DIAGNOSIS — C50111 Malignant neoplasm of central portion of right female breast: Secondary | ICD-10-CM

## 2017-04-08 DIAGNOSIS — Z171 Estrogen receptor negative status [ER-]: Secondary | ICD-10-CM

## 2017-04-08 DIAGNOSIS — C50919 Malignant neoplasm of unspecified site of unspecified female breast: Secondary | ICD-10-CM

## 2017-04-08 DIAGNOSIS — C50112 Malignant neoplasm of central portion of left female breast: Secondary | ICD-10-CM

## 2017-04-09 DIAGNOSIS — M25511 Pain in right shoulder: Secondary | ICD-10-CM | POA: Diagnosis not present

## 2017-04-09 DIAGNOSIS — M25611 Stiffness of right shoulder, not elsewhere classified: Secondary | ICD-10-CM | POA: Diagnosis not present

## 2017-04-11 DIAGNOSIS — M25611 Stiffness of right shoulder, not elsewhere classified: Secondary | ICD-10-CM | POA: Diagnosis not present

## 2017-04-11 DIAGNOSIS — M25511 Pain in right shoulder: Secondary | ICD-10-CM | POA: Diagnosis not present

## 2017-04-16 DIAGNOSIS — M25511 Pain in right shoulder: Secondary | ICD-10-CM | POA: Diagnosis not present

## 2017-04-16 DIAGNOSIS — M25611 Stiffness of right shoulder, not elsewhere classified: Secondary | ICD-10-CM | POA: Diagnosis not present

## 2017-04-19 DIAGNOSIS — M25611 Stiffness of right shoulder, not elsewhere classified: Secondary | ICD-10-CM | POA: Diagnosis not present

## 2017-04-19 DIAGNOSIS — M25511 Pain in right shoulder: Secondary | ICD-10-CM | POA: Diagnosis not present

## 2017-04-22 DIAGNOSIS — M25511 Pain in right shoulder: Secondary | ICD-10-CM | POA: Diagnosis not present

## 2017-04-22 DIAGNOSIS — M25611 Stiffness of right shoulder, not elsewhere classified: Secondary | ICD-10-CM | POA: Diagnosis not present

## 2017-04-23 ENCOUNTER — Encounter (HOSPITAL_COMMUNITY)
Admission: RE | Admit: 2017-04-23 | Discharge: 2017-04-23 | Disposition: A | Discharge status: Home / Self Care | Payer: Medicare Other | Source: Ambulatory Visit | Attending: Oncology | Admitting: Oncology

## 2017-04-23 DIAGNOSIS — C50919 Malignant neoplasm of unspecified site of unspecified female breast: Secondary | ICD-10-CM | POA: Diagnosis not present

## 2017-04-23 DIAGNOSIS — C50111 Malignant neoplasm of central portion of right female breast: Secondary | ICD-10-CM | POA: Insufficient documentation

## 2017-04-23 DIAGNOSIS — Z171 Estrogen receptor negative status [ER-]: Secondary | ICD-10-CM | POA: Diagnosis not present

## 2017-04-23 DIAGNOSIS — C50112 Malignant neoplasm of central portion of left female breast: Secondary | ICD-10-CM | POA: Diagnosis not present

## 2017-04-23 LAB — GLUCOSE, CAPILLARY: Glucose-Capillary: 90 mg/dL (ref 65–99)

## 2017-04-23 MED ORDER — FLUDEOXYGLUCOSE F - 18 (FDG) INJECTION
15.7800 | Freq: Once | INTRAVENOUS | Status: AC | PRN
  Administered 2017-04-23: 15.78 via INTRAVENOUS

## 2017-04-24 ENCOUNTER — Ambulatory Visit (HOSPITAL_BASED_OUTPATIENT_CLINIC_OR_DEPARTMENT_OTHER): Payer: Medicare Other

## 2017-04-24 ENCOUNTER — Ambulatory Visit: Payer: Medicare Other

## 2017-04-24 ENCOUNTER — Ambulatory Visit (HOSPITAL_BASED_OUTPATIENT_CLINIC_OR_DEPARTMENT_OTHER): Payer: Medicare Other | Admitting: Oncology

## 2017-04-24 ENCOUNTER — Other Ambulatory Visit (HOSPITAL_BASED_OUTPATIENT_CLINIC_OR_DEPARTMENT_OTHER): Payer: Medicare Other

## 2017-04-24 VITALS — BP 160/89 | HR 62 | Temp 98.8°F | Resp 18 | Ht 63.0 in | Wt 293.6 lb

## 2017-04-24 DIAGNOSIS — Z95828 Presence of other vascular implants and grafts: Secondary | ICD-10-CM

## 2017-04-24 DIAGNOSIS — C50911 Malignant neoplasm of unspecified site of right female breast: Secondary | ICD-10-CM

## 2017-04-24 DIAGNOSIS — Z7901 Long term (current) use of anticoagulants: Secondary | ICD-10-CM

## 2017-04-24 DIAGNOSIS — Z5112 Encounter for antineoplastic immunotherapy: Secondary | ICD-10-CM | POA: Diagnosis not present

## 2017-04-24 DIAGNOSIS — C50912 Malignant neoplasm of unspecified site of left female breast: Secondary | ICD-10-CM

## 2017-04-24 DIAGNOSIS — C50919 Malignant neoplasm of unspecified site of unspecified female breast: Secondary | ICD-10-CM

## 2017-04-24 DIAGNOSIS — Z171 Estrogen receptor negative status [ER-]: Secondary | ICD-10-CM

## 2017-04-24 LAB — CBC WITH DIFFERENTIAL/PLATELET
BASO%: 0.4 % (ref 0.0–2.0)
Basophils Absolute: 0 10*3/uL (ref 0.0–0.1)
EOS ABS: 0.1 10*3/uL (ref 0.0–0.5)
EOS%: 1.2 % (ref 0.0–7.0)
HCT: 34.5 % — ABNORMAL LOW (ref 34.8–46.6)
HEMOGLOBIN: 11.8 g/dL (ref 11.6–15.9)
LYMPH%: 24 % (ref 14.0–49.7)
MCH: 28.8 pg (ref 25.1–34.0)
MCHC: 34.2 g/dL (ref 31.5–36.0)
MCV: 84.1 fL (ref 79.5–101.0)
MONO#: 0.7 10*3/uL (ref 0.1–0.9)
MONO%: 7.9 % (ref 0.0–14.0)
NEUT%: 66.5 % (ref 38.4–76.8)
NEUTROS ABS: 5.7 10*3/uL (ref 1.5–6.5)
PLATELETS: 219 10*3/uL (ref 145–400)
RBC: 4.1 10*6/uL (ref 3.70–5.45)
RDW: 16.6 % — AB (ref 11.2–14.5)
WBC: 8.5 10*3/uL (ref 3.9–10.3)
lymph#: 2 10*3/uL (ref 0.9–3.3)

## 2017-04-24 LAB — PROTIME-INR
INR: 1.2 — ABNORMAL LOW (ref 2.00–3.50)
Protime: 14.4 Seconds — ABNORMAL HIGH (ref 10.6–13.4)

## 2017-04-24 LAB — COMPREHENSIVE METABOLIC PANEL
ALBUMIN: 3.4 g/dL — AB (ref 3.5–5.0)
ALK PHOS: 80 U/L (ref 40–150)
ALT: 15 U/L (ref 0–55)
ANION GAP: 8 meq/L (ref 3–11)
AST: 12 U/L (ref 5–34)
BILIRUBIN TOTAL: 0.88 mg/dL (ref 0.20–1.20)
BUN: 14.2 mg/dL (ref 7.0–26.0)
CO2: 26 mEq/L (ref 22–29)
CREATININE: 0.9 mg/dL (ref 0.6–1.1)
Calcium: 9.9 mg/dL (ref 8.4–10.4)
Chloride: 107 mEq/L (ref 98–109)
EGFR: 83 mL/min/{1.73_m2} — AB (ref >90)
GLUCOSE: 97 mg/dL (ref 70–140)
Potassium: 3.8 mEq/L (ref 3.5–5.1)
SODIUM: 142 meq/L (ref 136–145)
TOTAL PROTEIN: 7.6 g/dL (ref 6.4–8.3)

## 2017-04-24 MED ORDER — ACETAMINOPHEN 325 MG PO TABS
650.0000 mg | ORAL_TABLET | Freq: Once | ORAL | Status: AC
  Administered 2017-04-24: 650 mg via ORAL

## 2017-04-24 MED ORDER — HEPARIN SOD (PORK) LOCK FLUSH 100 UNIT/ML IV SOLN
500.0000 [IU] | Freq: Once | INTRAVENOUS | Status: AC | PRN
  Administered 2017-04-24: 500 [IU]
  Filled 2017-04-24: qty 5

## 2017-04-24 MED ORDER — TRASTUZUMAB CHEMO 150 MG IV SOLR
798.0000 mg | Freq: Once | INTRAVENOUS | Status: AC
  Administered 2017-04-24: 798 mg via INTRAVENOUS
  Filled 2017-04-24: qty 38

## 2017-04-24 MED ORDER — ACETAMINOPHEN 325 MG PO TABS
ORAL_TABLET | ORAL | Status: AC
  Filled 2017-04-24: qty 2

## 2017-04-24 MED ORDER — SODIUM CHLORIDE 0.9 % IJ SOLN
10.0000 mL | INTRAMUSCULAR | Status: DC | PRN
Start: 2017-04-24 — End: 2017-04-24
  Administered 2017-04-24: 10 mL
  Filled 2017-04-24: qty 10

## 2017-04-24 MED ORDER — LORAZEPAM 2 MG/ML IJ SOLN
INTRAMUSCULAR | Status: AC
  Filled 2017-04-24: qty 1

## 2017-04-24 MED ORDER — DIPHENHYDRAMINE HCL 25 MG PO CAPS
ORAL_CAPSULE | ORAL | Status: AC
  Filled 2017-04-24: qty 1

## 2017-04-24 MED ORDER — SODIUM CHLORIDE 0.9 % IJ SOLN
10.0000 mL | INTRAMUSCULAR | Status: DC | PRN
  Administered 2017-04-24: 10 mL via INTRAVENOUS
  Filled 2017-04-24: qty 10

## 2017-04-24 MED ORDER — LORAZEPAM 2 MG/ML IJ SOLN
1.0000 mg | Freq: Once | INTRAMUSCULAR | Status: AC | PRN
  Administered 2017-04-24: 1 mg via INTRAVENOUS

## 2017-04-24 MED ORDER — SODIUM CHLORIDE 0.9 % IV SOLN
Freq: Once | INTRAVENOUS | Status: AC
  Administered 2017-04-24: 09:00:00 via INTRAVENOUS

## 2017-04-24 MED ORDER — DIPHENHYDRAMINE HCL 25 MG PO CAPS
25.0000 mg | ORAL_CAPSULE | Freq: Once | ORAL | Status: AC
  Administered 2017-04-24: 25 mg via ORAL

## 2017-04-24 NOTE — Progress Notes (Signed)
[facility]  [phone] [phone]  OFFICE PROGRESS NOTE  ID: [name]   DOB: 11/27/1950  MR#: [id number]  SPQ#:[id number]  PCP: Ernestene Kiel, MD GYN:  SU:  OTHER MD: Pierre Bali, Johnnette Gourd, Wendee Copp  CC: Metastatic HER-2 positive, estrogen receptor negative breast cancer  CURRENT TREATMENT: Trastuzumab every 28 days   INTERVAL HISTORY: [name] returns today for follow-up and treatment of her estrogen receptor negative breast cancer. She continues on trastuzumab every 28 days. She has no side effects from this that she is aware of. She had an echocardiogram in May which showed an excellent ejection fraction.  When here months ago, on 03/27/2017, she tells me she had 3 grandchildren with her, stumbled and fell, hit her knees, hit her head, and hit particularly her right arm. Plain films were obtained which at that time did not show a fracture. She had a mild headache transiently but that has resolved and does not have any headache, visual changes, nausea, vomiting, or dizziness. There have been no further falls.  She is followed by [facility] orth[facility]ics and is having physical therapy to the right arm. She has decreased range of motion and cannot lift much on the right. Her pain is well-controlled on Tylenol, Voltaren gel, and occasional oxycodone.  She was just restaged with a PET scan which shows no evidence of active cancer. It does show a nondisplaced right humeral fracture with some callus formation  REVIEW OF SYSTEMS: She is normally active for her. She is working in her garden which is extensive and has been eating quite a bit of greens especially spinach. A detailed review of systems today was otherwise stable  BREAST CANCER HISTORY:   From the earlier summary:  [name] is 66 years old [name] female.  This woman has been in good health all of her life.  She noted a swelling and discomfort in  her right breast in June 2004.  She was seen in the Emergency Room in [place] and was treated for mastitis.  She was treated for a number of months with mastitis and the swelling did not get better. She was given hydrocodone and Cipro.  Finally, the swelling did get better and ultimately the nipple became retracted and she noticed some dimpling in her skin. She had a mammogram in July of 2004 in [place] with subsequent mammogram on May 27, 2003, by Dr. Isaiah Blakes.  Mammogram done on September 30 showed marked increased density in the left breast.  Biopsy was performed the same day.  It was noted at the 12 o'clock position, deep in the breast was a focal hypoechoic mass, at least 3.5 cm in diameter.  Biopsy did in fact show invasive in situ mammary carcinoma. This was felt to be both at least intermediate, high grade.  No definite lymphovascular invasion was identified. ER and PR negative, Her2 testing positive. [name] continues to have pain in her breast.  She continues to take hydrocodone a number of times a day.  She has been seen by Dr. Rosana Hoes, who felt that neoadjuvant chemotherapy would be required.    Initial staging studies showed evidence of liver and lung mets.   Patient also has evidence of bone lesions. Patient started neoadjuvant chemotherapy, Taxotere/Carbo/Herceptin in October 2004.   Patient had a CT scan in December 2004 which demonstrated extensive clot in the SVC innominate vein, bilateral jugular vein and  She was started on anticoagulation therapy. She received a total  of 6 cycles of Taxotere/Carbo/Herceptin, completed in April 2005."  Patient has been on  trastuzumab continued indefinitely; has also received lapatinib and capecitabine for variable intervals in 2007-2008. Most recent echo 12/01/2013 showed an ejection fraction of 55%. She is status post bilateral mastectomies with bilateral axillary lymph node dissection 12/07/2004, showing (a) on the right, a mypT1c ypN1 invasive ductal  carcinoma, grade 3, estrogen and progesterone receptor negative, HER-2 positive, with an MIB-1 of 31% (b) on the left, ypT2 ypN1 invasive ductal carcinoma, grade 2, estrogen and progesterone receptor negative, HER-2 positive, with an MIB-1 of 35%. She is Status post radiation June through July of 2006, to the right chest wall, left chest wall, bilateral supraclavicular fossae, and bilateral axillary boosts; with additional radiation to the right and left chest walls and the central chest wall completed November of 2007. She is status post ixempra x9 completed August of 2009. 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 Patient has chronic pain, with negative PET scan 08/24/2013 (no evidence of active cancer). On Neurontin and Tramadol therapy.  Her subsequent history is as detailed below  PAST MEDICAL HISTORY: Past Medical History:  Diagnosis Date  . Breast cancer ([place])    mets to liver and lung  . Breast cancer metastasized to multiple sites ([place]) 02/26/2013  . History of chemotherapy Feb. 2006   taxotere/herceptin/carboplatin  . Hypertension   . Neuropathy   . Radiation 07/31/2006   left upper chest  . Radiation 06/17/2006-06/27/2006   6480 cGy bilat. chest wall  . SVC syndrome   . Thrombosis     PAST SURGICAL HISTORY: Past Surgical History:  Procedure Laterality Date  . ANKLE SURGERY    . BACK SURGERY    . CHOLECYSTECTOMY  1989  . MASTECTOMY Bilateral   . PERIPHERALLY INSERTED CENTRAL CATHETER INSERTION    . TUBAL LIGATION  1986    FAMILY HISTORY Family History  Problem Relation Age of Onset  . Heart failure Father   . Cancer Father        Prostate cancer  . Heart failure Brother   . Cancer Brother        Prostate cancer  . Diabetes Maternal Aunt    She had three brothers, one died of gunshot wound, one of complications of diabetes mellitus and one of myocardial infarction.  She has no  sisters.  Mother died of complications of brain metastasis in 77.  Father has had a myocardial infarction in 1999.  No history of breast or ovarian cancer in the family.    GYNECOLOGIC HISTORY:   Menarche at age 89.  Gravida 3, para 3.  First live birth at age 40.  No history of breast feeding. No history of hormonal replacement therapy.   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 Her husband used to work as a Art gallery manager, but is now retired. She has three children, Monette who lives in [place] and works as a Hydrographic surveyor, Financial risk analyst who lives in [place] and works as a Administrator, and [place] who lives in [place] and also works as a Hydrographic surveyor. The patient has 12 grandchildren and 4 great-grandchildren. She attends a [employer]. Very involved with school kids.  ADVANCED DIRECTIVES:  Not in place  HEALTH MAINTENANCE: (Updated 06/12/2013) Social History  Substance Use Topics  . Smoking status: Never Smoker  .  Smokeless tobacco: Never Used  . Alcohol use Yes     Comment: occasional     Colonoscopy: Never and "I don't want one"  PAP:  1987  Bone density:  Never  Lipid panel:  Not on file    Allergies  Allergen Reactions  . Penicillins Hives    PATIENT HAS TOLERATED CEPHALOSPORINGS  . Adhesive [Tape] Other (See Comments)    Tears skin     Current Outpatient Prescriptions  Medication Sig Dispense Refill  . acetaminophen (TYLENOL) 500 MG tablet Take 1,000 mg by mouth every 6 (six) hours as needed for mild pain or fever.     Marland Kitchen albuterol (PROVENTIL HFA;VENTOLIN HFA) 108 (90 BASE) MCG/ACT inhaler Inhale 2 puffs into the lungs every 6 (six) hours as needed for wheezing. 1 Inhaler 5  . ALPRAZolam (XANAX) 1 MG tablet Take 1 tablet (1 mg total) by mouth 3 (three) times daily as needed. for anxiety 60 tablet 3  . amLODipine (NORVASC) 10 MG tablet Take 10 mg by mouth every morning.    . baclofen (LIORESAL)  10 MG tablet Take 1 tablet (10 mg total) by mouth 3 (three) times daily as needed. for muscle spams 270 tablet 3  . carvedilol (COREG) 3.125 MG tablet Take 1 tablet (3.125 mg total) by mouth 2 (two) times daily. 180 tablet 3  . diclofenac sodium (VOLTAREN) 1 % GEL Apply 2 g topically daily as needed (for pain). Apply to knees and shoulders 100 g 6  . furosemide (LASIX) 80 MG tablet Take 80 mg by mouth daily as needed for fluid.     Marland Kitchen gabapentin (NEURONTIN) 300 MG capsule TAKE 2 CAPSULES(600 MG) BY MOUTH THREE TIMES DAILY 540 capsule 0  . losartan (COZAAR) 100 MG tablet Take 100 mg by mouth every morning.    . potassium chloride (K-DUR,KLOR-CON) 10 MEQ tablet Take 4 tablets (40 meq)  when you take Lasix. 120 tablet 5  . spironolactone (ALDACTONE) 25 MG tablet TAKE 1 TABLET BY MOUTH EVERY DAY 30 tablet 3  . temazepam (RESTORIL) 30 MG capsule TAKE ONE CAPSULE BY MOUTH EVERY NIGHT AT BEDTIME AS NEEDED FOR SLEEP 30 capsule 2  . warfarin (COUMADIN) 5 MG tablet TAKE 1 TABLET(5 MG) BY MOUTH DAILY 90 tablet 0   No current facility-administered medications for this visit.    Facility-Administered Medications Ordered in Other Visits  Medication Dose Route Frequency Provider Last Rate Last Dose  . sodium chloride flush (NS) 0.9 % injection 10 mL  10 mL Intravenous PRN Magrinat, Virgie Dad, MD   10 mL at 12/15/15 1200    OBJECTIVE: Middle-aged African-American woman in no acute distress  Vitals:   04/24/17 0814  BP: (!) 160/89  Pulse: 62  Resp: 18  Temp: 98.8 F (37.1 C)  SpO2: 98%     Body mass index is 52.01 kg/m.    ECOG FS: 1 Filed Weights   04/24/17 0814  Weight: 293 lb 9.6 oz (133.2 kg)   Sclerae unicteric, EOMs intact Oropharynx clear and moist No cervical or supraclavicular adenopathy Lungs no rales or rhonchi Heart regular rate and rhythm Abd soft, nontender, positive bowel sounds MSK no focal spinal tenderness, no upper extremity lymphedema Neuro: nonfocal, well oriented,  appropriate affect Breasts: Status post bilateral mastectomies. No evidence of chest wall recurrence. Both axillae are benign.   LAB RESULTS: Lab Results  Component Value Date   WBC 8.5 04/24/2017   NEUTROABS 5.7 04/24/2017   HGB 11.8 04/24/2017   HCT 34.5 (  L) 04/24/2017   MCV 84.1 04/24/2017   PLT 219 04/24/2017      Chemistry      Component Value Date/Time   NA 140 03/27/2017 0916   K 4.0 03/27/2017 0916   CL 107 12/09/2014 0940   CL 104 01/30/2013 0850   CO2 25 03/27/2017 0916   BUN 31.7 (H) 03/27/2017 0916   CREATININE 1.1 03/27/2017 0916      Component Value Date/Time   CALCIUM 9.5 03/27/2017 0916   ALKPHOS 68 03/27/2017 0916   AST 11 03/27/2017 0916   ALT 19 03/27/2017 0916   BILITOT 0.67 03/27/2017 0916     STUDIES: Dg Shoulder Right  Result Date: 03/27/2017 CLINICAL DATA:  Fall. EXAM: RIGHT SHOULDER - 2+ VIEW COMPARISON:  CT chest 10/17/ 2017. FINDINGS: Surgical clips right chest. PowerPort catheter with tip over the right atrium. No acute bony abnormality. Acromioclavicular glenohumeral degenerative change. Corticated small calcific density noted projected over the supraspinatus space consistent calcific supraspinatus tendinosis . IMPRESSION: 1. No acute bony abnormality. Degenerative changes right shoulder. Corticated small calcific density noted projected over the right supraspinatus space consistent with calcific supraspinatus tendinosis. 2. PowerPort catheter noted in stable position . Electronically Signed   By: Marcello Moores  Register   On: 03/27/2017 12:57   Nm Pet Image Restag (ps) Skull Base To Thigh  Result Date: 04/23/2017 CLINICAL DATA:  Subsequent treatment strategy for metastatic breast cancer. EXAM: NUCLEAR MEDICINE PET SKULL BASE TO THIGH TECHNIQUE: 15.8 mCi F-18 FDG was injected intravenously. Full-ring PET imaging was performed from the skull base to thigh after the radiotracer. CT data was obtained and used for attenuation correction and anatomic  localization. FASTING BLOOD GLUCOSE:  Value: 90 mg/dl COMPARISON:  Multiple exams, including 07/13/2016 FINDINGS: NECK No hypermetabolic lymph nodes in the neck. Symmetric activity in the tonsillar pillars thought to be physiologic. CHEST No hypermetabolic mediastinal or hilar nodes. No suspicious pulmonary nodules on the CT data. Right Port-A-Cath tip: Right atrium. Activity in the Port-A-Cath is likely injection related. Mild cardiomegaly. Bilateral mastectomies. Suspected fluid collection along the lateral margin of the left pectoralis musculature is not hypermetabolic and likely simply a postoperative fluid collection. ABDOMEN/PELVIS No abnormal hypermetabolic activity within the liver, pancreas, adrenal glands, or spleen. No hypermetabolic lymph nodes in the abdomen or pelvis. Cholecystectomy. Right kidney lower pole photopenic lesion compatible with cyst. Right adnexal rim calcified lesion without hypermetabolic activity, favoring subserosal or wandering fibroid. SKELETON Early subacute fracture of the right greater tuberosity appears nondisplaced but is associated with metabolic activity of healing response, maximum SUV 6.9, I favor a benign greater tuberosity fracture. The patient had a radiograph of the right shoulder on 03/27/2017 for a fall on which this fracture was not visible, but the fracture itself would probably be occult on radiography due to the nondisplaced nature. IMPRESSION: 1. There is no current evidence of active malignancy. 2. Subacute fracture of the right humerus greater tuberosity is nondisplaced and is associated with high activity but is thought to be benign. The patient reportedly had a fall at the beginning of the month with injury of the right shoulder. 3. Mild cardiomegaly. Electronically Signed   By: Van Clines M.D.   On: 04/23/2017 16:34     ASSESSMENT: 66 y.o.  [name], [place], woman  (1)  with a history of inflammatory right breast cancer metastatic at  presentation September 2004 with involvement of liver and bone, HER-2 positive, estrogen and progesterone receptor negative  (2) treated with carboplatin, docetaxel and  Herceptin x 6 completed April 2005  (3) trastuzumab continued indefinitely;   (a) has also received lapatinib and capecitabine for variable intervals in 2007-2008.  (b) Every 6 month echo 01/14/2017 showed an ejection fraction in the 55-60%  (4) status post bilateral mastectomies with bilateral axillary lymph node dissection 12/07/2004, showing  (a) on the right, a mypT1c ypN1 invasive ductal carcinoma, grade 3, estrogen and progesterone receptor negative, HER-2 positive, with an MIB-1 of 31%  (b) on the left, ypT2 ypN1 invasive ductal carcinoma, grade 2, estrogen and progesterone receptor negative, HER-2 positive, with an MIB-1 of 35%.  (5)  Status post radiation June through July of 2006, to the right chest wall, left chest wall, bilateral supraclavicular fossae, and bilateral axillary boosts; with additional radiation to the right and left chest walls and the central chest wall completed November of 2007  (6) status post Ixempra x9 completed August of 2009.  (7) history of superior vena caval syndrome, on life long anticoagulation   (8)  History of chemotherapy-induced neuropathy.   (9)  chronic pain, with negative PET scan 08/24/2013 (no evidence of active cancer). On Neurontin and Tramadol  (a) repeat PET scan December 2016 again negative.  (b) Repeat PET scan 04/23/2017 shows no active malignancy  (10) right upper extremity cellulitis, no bacteremia; treated with cephalexin /doxycycline for 2 weeks, with resolution  (11 subacute fracture right humerus, nondisplaced, status post fall 03/27/2017   PLAN: [name] is now 14 years out from initial diagnosis of metastatic breast cancer. Her PET scan shows no evidence of active disease. This is very favorable.  We are continuing the trastuzumab indefinitely: We do not  have data to tell us that stopping it is safe. She is tolerating it well and she had her most recent echo in May. It showed a well-preserved ejection fraction.  Her next echocardiogram will be in November. She will see me 07/16/2017 after that echo and she will have her treatment that day as well.  We reviewed her INR which is low today. She has been eating a lot of greens because she has a wonderful garden that she enjoys. I asked her to back off on that and that should be enough to correct the INR had it was high on the same dose and month ago.  We reviewed the fact that she does have a nondisplaced fracture of the right humerus. She will bring a copy of her PET scan to her orthopedist in [place].  I'm delighted that she is doing so well. She knows to call for any problems that may develop before her next visit here, which will be right before Thanksgiving's.   Chauncey Cruel, MD  04/24/17 8:17 AM

## 2017-04-24 NOTE — Patient Instructions (Signed)
[facility] Discharge Instructions for Patients Receiving Chemotherapy  Today you received the following chemotherapy agents   To help prevent nausea and vomiting after your treatment, we encourage you to take your nausea medication as prescribed.   If you develop nausea and vomiting that is not controlled by your nausea medication, call the clinic.   BELOW ARE SYMPTOMS THAT SHOULD BE REPORTED IMMEDIATELY:  *FEVER GREATER THAN 100.5 F  *CHILLS WITH OR WITHOUT FEVER  NAUSEA AND VOMITING THAT IS NOT CONTROLLED WITH YOUR NAUSEA MEDICATION  *UNUSUAL SHORTNESS OF BREATH  *UNUSUAL BRUISING OR BLEEDING  TENDERNESS IN MOUTH AND THROAT WITH OR WITHOUT PRESENCE OF ULCERS  *URINARY PROBLEMS  *BOWEL PROBLEMS  UNUSUAL RASH Items with * indicate a potential emergency and should be followed up as soon as possible.  Feel free to call the clinic you have any questions or concerns. The clinic phone number is [phone].  Please show the [facility] at check-in to the Emergency Department and triage nurse.

## 2017-04-25 ENCOUNTER — Other Ambulatory Visit (HOSPITAL_COMMUNITY): Payer: Self-pay | Admitting: Cardiology

## 2017-04-25 MED ORDER — CARVEDILOL 3.125 MG PO TABS: 3.1250 mg | ORAL_TABLET | Freq: Two times a day (BID) | ORAL | 3 refills | Status: DC

## 2017-04-26 DIAGNOSIS — M25511 Pain in right shoulder: Secondary | ICD-10-CM | POA: Diagnosis not present

## 2017-04-26 DIAGNOSIS — J029 Acute pharyngitis, unspecified: Secondary | ICD-10-CM | POA: Diagnosis not present

## 2017-04-26 DIAGNOSIS — M25611 Stiffness of right shoulder, not elsewhere classified: Secondary | ICD-10-CM | POA: Diagnosis not present

## 2017-05-02 DIAGNOSIS — M25611 Stiffness of right shoulder, not elsewhere classified: Secondary | ICD-10-CM | POA: Diagnosis not present

## 2017-05-02 DIAGNOSIS — M25511 Pain in right shoulder: Secondary | ICD-10-CM | POA: Diagnosis not present

## 2017-05-14 DIAGNOSIS — M170 Bilateral primary osteoarthritis of knee: Secondary | ICD-10-CM | POA: Diagnosis not present

## 2017-05-22 ENCOUNTER — Ambulatory Visit (HOSPITAL_BASED_OUTPATIENT_CLINIC_OR_DEPARTMENT_OTHER): Payer: Medicare Other

## 2017-05-22 ENCOUNTER — Ambulatory Visit: Payer: Medicare Other

## 2017-05-22 ENCOUNTER — Other Ambulatory Visit (HOSPITAL_BASED_OUTPATIENT_CLINIC_OR_DEPARTMENT_OTHER): Payer: Medicare Other

## 2017-05-22 ENCOUNTER — Telehealth: Payer: Self-pay

## 2017-05-22 VITALS — BP 125/76 | HR 70 | Temp 98.5°F | Resp 18

## 2017-05-22 DIAGNOSIS — Z86718 Personal history of other venous thrombosis and embolism: Secondary | ICD-10-CM | POA: Diagnosis not present

## 2017-05-22 DIAGNOSIS — Z5112 Encounter for antineoplastic immunotherapy: Secondary | ICD-10-CM

## 2017-05-22 DIAGNOSIS — C50911 Malignant neoplasm of unspecified site of right female breast: Secondary | ICD-10-CM

## 2017-05-22 DIAGNOSIS — Z95828 Presence of other vascular implants and grafts: Secondary | ICD-10-CM

## 2017-05-22 DIAGNOSIS — C50919 Malignant neoplasm of unspecified site of unspecified female breast: Secondary | ICD-10-CM

## 2017-05-22 DIAGNOSIS — C50912 Malignant neoplasm of unspecified site of left female breast: Secondary | ICD-10-CM

## 2017-05-22 DIAGNOSIS — Z7901 Long term (current) use of anticoagulants: Secondary | ICD-10-CM

## 2017-05-22 LAB — CBC WITH DIFFERENTIAL/PLATELET
BASO%: 0.1 % (ref 0.0–2.0)
BASOS ABS: 0 10*3/uL (ref 0.0–0.1)
EOS ABS: 0.1 10*3/uL (ref 0.0–0.5)
EOS%: 0.6 % (ref 0.0–7.0)
HEMATOCRIT: 35.1 % (ref 34.8–46.6)
HGB: 12.2 g/dL (ref 11.6–15.9)
LYMPH#: 3.1 10*3/uL (ref 0.9–3.3)
LYMPH%: 25 % (ref 14.0–49.7)
MCH: 29.2 pg (ref 25.1–34.0)
MCHC: 34.8 g/dL (ref 31.5–36.0)
MCV: 84 fL (ref 79.5–101.0)
MONO#: 1 10*3/uL — AB (ref 0.1–0.9)
MONO%: 7.9 % (ref 0.0–14.0)
NEUT#: 8.2 10*3/uL — ABNORMAL HIGH (ref 1.5–6.5)
NEUT%: 66.4 % (ref 38.4–76.8)
Platelets: 231 10*3/uL (ref 145–400)
RBC: 4.18 10*6/uL (ref 3.70–5.45)
RDW: 15.3 % — AB (ref 11.2–14.5)
WBC: 12.4 10*3/uL — ABNORMAL HIGH (ref 3.9–10.3)

## 2017-05-22 LAB — COMPREHENSIVE METABOLIC PANEL
ALT: 11 U/L (ref 0–55)
ANION GAP: 8 meq/L (ref 3–11)
AST: 10 U/L (ref 5–34)
Albumin: 3.4 g/dL — ABNORMAL LOW (ref 3.5–5.0)
Alkaline Phosphatase: 71 U/L (ref 40–150)
BUN: 25.5 mg/dL (ref 7.0–26.0)
CALCIUM: 9.5 mg/dL (ref 8.4–10.4)
CHLORIDE: 107 meq/L (ref 98–109)
CO2: 26 mEq/L (ref 22–29)
Creatinine: 0.9 mg/dL (ref 0.6–1.1)
EGFR: 81 mL/min/{1.73_m2} — ABNORMAL LOW (ref >90)
Glucose: 93 mg/dl (ref 70–140)
POTASSIUM: 3.9 meq/L (ref 3.5–5.1)
Sodium: 140 mEq/L (ref 136–145)
Total Bilirubin: 0.78 mg/dL (ref 0.20–1.20)
Total Protein: 7.7 g/dL (ref 6.4–8.3)

## 2017-05-22 LAB — PROTIME-INR
INR: 1.3 — AB (ref 2.00–3.50)
PROTIME: 15.6 s — AB (ref 10.6–13.4)

## 2017-05-22 MED ORDER — TRASTUZUMAB CHEMO 150 MG IV SOLR
798.0000 mg | Freq: Once | INTRAVENOUS | Status: AC
  Administered 2017-05-22: 798 mg via INTRAVENOUS
  Filled 2017-05-22: qty 38

## 2017-05-22 MED ORDER — ACETAMINOPHEN 325 MG PO TABS
650.0000 mg | ORAL_TABLET | Freq: Once | ORAL | Status: AC
  Administered 2017-05-22: 650 mg via ORAL

## 2017-05-22 MED ORDER — ACETAMINOPHEN 325 MG PO TABS
ORAL_TABLET | ORAL | Status: AC
  Filled 2017-05-22: qty 2

## 2017-05-22 MED ORDER — LORAZEPAM 2 MG/ML IJ SOLN
INTRAMUSCULAR | Status: AC
  Filled 2017-05-22: qty 1

## 2017-05-22 MED ORDER — DIPHENHYDRAMINE HCL 25 MG PO CAPS
25.0000 mg | ORAL_CAPSULE | Freq: Once | ORAL | Status: AC
  Administered 2017-05-22: 25 mg via ORAL

## 2017-05-22 MED ORDER — SODIUM CHLORIDE 0.9 % IJ SOLN
10.0000 mL | INTRAMUSCULAR | Status: DC | PRN
  Administered 2017-05-22: 10 mL via INTRAVENOUS
  Filled 2017-05-22: qty 10

## 2017-05-22 MED ORDER — SODIUM CHLORIDE 0.9 % IJ SOLN
10.0000 mL | INTRAMUSCULAR | Status: DC | PRN
  Administered 2017-05-22: 10 mL
  Filled 2017-05-22: qty 10

## 2017-05-22 MED ORDER — HEPARIN SOD (PORK) LOCK FLUSH 100 UNIT/ML IV SOLN
500.0000 [IU] | Freq: Once | INTRAVENOUS | Status: AC | PRN
  Administered 2017-05-22: 500 [IU]
  Filled 2017-05-22: qty 5

## 2017-05-22 MED ORDER — SODIUM CHLORIDE 0.9 % IV SOLN
Freq: Once | INTRAVENOUS | Status: AC
  Administered 2017-05-22: 10:00:00 via INTRAVENOUS

## 2017-05-22 MED ORDER — LORAZEPAM 2 MG/ML IJ SOLN
1.0000 mg | Freq: Once | INTRAMUSCULAR | Status: AC | PRN
  Administered 2017-05-22: 1 mg via INTRAVENOUS

## 2017-05-22 MED ORDER — DIPHENHYDRAMINE HCL 25 MG PO CAPS
ORAL_CAPSULE | ORAL | Status: AC
  Filled 2017-05-22: qty 1

## 2017-05-22 NOTE — Patient Instructions (Signed)
[facility] Discharge Instructions for Patients Receiving Chemotherapy  Today you received the following chemotherapy agents Herceptin  To help prevent nausea and vomiting after your treatment, we encourage you to take your nausea medication as directed   If you develop nausea and vomiting that is not controlled by your nausea medication, call the clinic.   BELOW ARE SYMPTOMS THAT SHOULD BE REPORTED IMMEDIATELY:  *FEVER GREATER THAN 100.5 F  *CHILLS WITH OR WITHOUT FEVER  NAUSEA AND VOMITING THAT IS NOT CONTROLLED WITH YOUR NAUSEA MEDICATION  *UNUSUAL SHORTNESS OF BREATH  *UNUSUAL BRUISING OR BLEEDING  TENDERNESS IN MOUTH AND THROAT WITH OR WITHOUT PRESENCE OF ULCERS  *URINARY PROBLEMS  *BOWEL PROBLEMS  UNUSUAL RASH Items with * indicate a potential emergency and should be followed up as soon as possible.  Feel free to call the clinic should you have any questions or concerns. The clinic phone number is [phone].  Please show the CHEMO ALERT CARD at check-in to the Emergency Department and triage nurse.   

## 2017-05-22 NOTE — Telephone Encounter (Signed)
Printed avs and calender for patient. Husband picked up documents. Per 9/26

## 2017-05-23 ENCOUNTER — Telehealth: Payer: Self-pay

## 2017-05-23 NOTE — Telephone Encounter (Signed)
Called patient to check in concerning INR results.  Encouraged patient to make sure she was taking her coumadin as prescribed and she stated she was.  Asked patient about diet.  She stated that she had just been to family gatherings this past weekend.  Patient knows she is supposed to stay away from greens but she ate all the foods there that she grew up with.  She understands that she needs to limit leafy greens in her diet.  Patient had no questions about call from LPN.

## 2017-06-03 ENCOUNTER — Other Ambulatory Visit (HOSPITAL_COMMUNITY): Payer: Self-pay | Admitting: Internal Medicine

## 2017-06-19 ENCOUNTER — Other Ambulatory Visit (HOSPITAL_BASED_OUTPATIENT_CLINIC_OR_DEPARTMENT_OTHER): Payer: Medicare Other

## 2017-06-19 ENCOUNTER — Ambulatory Visit: Payer: Medicare Other

## 2017-06-19 ENCOUNTER — Ambulatory Visit (HOSPITAL_BASED_OUTPATIENT_CLINIC_OR_DEPARTMENT_OTHER): Payer: Medicare Other

## 2017-06-19 VITALS — BP 152/74 | HR 63 | Temp 99.3°F | Resp 16

## 2017-06-19 DIAGNOSIS — C50911 Malignant neoplasm of unspecified site of right female breast: Secondary | ICD-10-CM

## 2017-06-19 DIAGNOSIS — C50919 Malignant neoplasm of unspecified site of unspecified female breast: Secondary | ICD-10-CM

## 2017-06-19 DIAGNOSIS — Z95828 Presence of other vascular implants and grafts: Secondary | ICD-10-CM

## 2017-06-19 DIAGNOSIS — Z5112 Encounter for antineoplastic immunotherapy: Secondary | ICD-10-CM | POA: Diagnosis not present

## 2017-06-19 DIAGNOSIS — Z7901 Long term (current) use of anticoagulants: Secondary | ICD-10-CM

## 2017-06-19 DIAGNOSIS — C50912 Malignant neoplasm of unspecified site of left female breast: Secondary | ICD-10-CM | POA: Diagnosis not present

## 2017-06-19 DIAGNOSIS — Z23 Encounter for immunization: Secondary | ICD-10-CM | POA: Diagnosis not present

## 2017-06-19 DIAGNOSIS — I509 Heart failure, unspecified: Secondary | ICD-10-CM

## 2017-06-19 LAB — COMPREHENSIVE METABOLIC PANEL
ALT: 15 U/L (ref 0–55)
ANION GAP: 8 meq/L (ref 3–11)
AST: 11 U/L (ref 5–34)
Albumin: 3.3 g/dL — ABNORMAL LOW (ref 3.5–5.0)
Alkaline Phosphatase: 69 U/L (ref 40–150)
BUN: 23.5 mg/dL (ref 7.0–26.0)
CALCIUM: 9.2 mg/dL (ref 8.4–10.4)
CHLORIDE: 112 meq/L — AB (ref 98–109)
CO2: 24 meq/L (ref 22–29)
Creatinine: 0.8 mg/dL (ref 0.6–1.1)
EGFR: >60 mL/min/{1.73_m2} (ref >60)
Glucose: 103 mg/dl (ref 70–140)
Potassium: 4 mEq/L (ref 3.5–5.1)
Sodium: 144 mEq/L (ref 136–145)
TOTAL PROTEIN: 7.1 g/dL (ref 6.4–8.3)
Total Bilirubin: 0.66 mg/dL (ref 0.20–1.20)

## 2017-06-19 LAB — CBC WITH DIFFERENTIAL/PLATELET
BASO%: 0.3 % (ref 0.0–2.0)
Basophils Absolute: 0 10*3/uL (ref 0.0–0.1)
EOS%: 0.8 % (ref 0.0–7.0)
Eosinophils Absolute: 0.1 10*3/uL (ref 0.0–0.5)
HCT: 36.7 % (ref 34.8–46.6)
HGB: 12.9 g/dL (ref 11.6–15.9)
LYMPH%: 24.1 % (ref 14.0–49.7)
MCH: 30.1 pg (ref 25.1–34.0)
MCHC: 35.1 g/dL (ref 31.5–36.0)
MCV: 85.7 fL (ref 79.5–101.0)
MONO#: 0.6 10*3/uL (ref 0.1–0.9)
MONO%: 6.4 % (ref 0.0–14.0)
NEUT#: 5.9 10*3/uL (ref 1.5–6.5)
NEUT%: 68.4 % (ref 38.4–76.8)
Platelets: 216 10*3/uL (ref 145–400)
RBC: 4.28 10*6/uL (ref 3.70–5.45)
RDW: 14.9 % — AB (ref 11.2–14.5)
WBC: 8.7 10*3/uL (ref 3.9–10.3)
lymph#: 2.1 10*3/uL (ref 0.9–3.3)
nRBC: 0 % (ref 0–0)

## 2017-06-19 LAB — PROTIME-INR
INR: 2.9 (ref 2.00–3.50)
PROTIME: 34.8 s — AB (ref 10.6–13.4)

## 2017-06-19 MED ORDER — DIPHENHYDRAMINE HCL 25 MG PO CAPS
ORAL_CAPSULE | ORAL | Status: AC
  Filled 2017-06-19: qty 1

## 2017-06-19 MED ORDER — SODIUM CHLORIDE 0.9 % IJ SOLN
10.0000 mL | INTRAMUSCULAR | Status: DC | PRN
  Administered 2017-06-19: 10 mL
  Filled 2017-06-19: qty 10

## 2017-06-19 MED ORDER — ACETAMINOPHEN 325 MG PO TABS
ORAL_TABLET | ORAL | Status: AC
  Filled 2017-06-19: qty 2

## 2017-06-19 MED ORDER — TRASTUZUMAB CHEMO 150 MG IV SOLR
798.0000 mg | Freq: Once | INTRAVENOUS | Status: AC
  Administered 2017-06-19: 798 mg via INTRAVENOUS
  Filled 2017-06-19: qty 38

## 2017-06-19 MED ORDER — LORAZEPAM 2 MG/ML IJ SOLN
INTRAMUSCULAR | Status: AC
  Filled 2017-06-19: qty 1

## 2017-06-19 MED ORDER — ACETAMINOPHEN 325 MG PO TABS
650.0000 mg | ORAL_TABLET | Freq: Once | ORAL | Status: AC
  Administered 2017-06-19: 650 mg via ORAL

## 2017-06-19 MED ORDER — LORAZEPAM 2 MG/ML IJ SOLN
1.0000 mg | Freq: Once | INTRAMUSCULAR | Status: AC | PRN
  Administered 2017-06-19: 1 mg via INTRAVENOUS

## 2017-06-19 MED ORDER — DIPHENHYDRAMINE HCL 25 MG PO CAPS
25.0000 mg | ORAL_CAPSULE | Freq: Once | ORAL | Status: AC
  Administered 2017-06-19: 25 mg via ORAL

## 2017-06-19 MED ORDER — HEPARIN SOD (PORK) LOCK FLUSH 100 UNIT/ML IV SOLN
500.0000 [IU] | Freq: Once | INTRAVENOUS | Status: AC | PRN
  Administered 2017-06-19: 500 [IU]
  Filled 2017-06-19: qty 5

## 2017-06-19 MED ORDER — SODIUM CHLORIDE 0.9 % IV SOLN
Freq: Once | INTRAVENOUS | Status: AC
  Administered 2017-06-19: 10:00:00 via INTRAVENOUS

## 2017-06-19 MED ORDER — SODIUM CHLORIDE 0.9 % IJ SOLN
10.0000 mL | INTRAMUSCULAR | Status: DC | PRN
  Administered 2017-06-19: 10 mL via INTRAVENOUS
  Filled 2017-06-19: qty 10

## 2017-06-19 MED ORDER — INFLUENZA VAC SPLIT HIGH-DOSE 0.5 ML IM SUSY
0.5000 mL | PREFILLED_SYRINGE | INTRAMUSCULAR | Status: AC
  Administered 2017-06-19: 0.5 mL via INTRAMUSCULAR
  Filled 2017-06-19: qty 0.5

## 2017-06-19 NOTE — Patient Instructions (Signed)
[facility] Discharge Instructions for Patients Receiving Chemotherapy  Today you received the following chemotherapy agents Herceptin  To help prevent nausea and vomiting after your treatment, we encourage you to take your nausea medication as directed   If you develop nausea and vomiting that is not controlled by your nausea medication, call the clinic.   BELOW ARE SYMPTOMS THAT SHOULD BE REPORTED IMMEDIATELY:  *FEVER GREATER THAN 100.5 F  *CHILLS WITH OR WITHOUT FEVER  NAUSEA AND VOMITING THAT IS NOT CONTROLLED WITH YOUR NAUSEA MEDICATION  *UNUSUAL SHORTNESS OF BREATH  *UNUSUAL BRUISING OR BLEEDING  TENDERNESS IN MOUTH AND THROAT WITH OR WITHOUT PRESENCE OF ULCERS  *URINARY PROBLEMS  *BOWEL PROBLEMS  UNUSUAL RASH Items with * indicate a potential emergency and should be followed up as soon as possible.  Feel free to call the clinic should you have any questions or concerns. The clinic phone number is [phone].  Please show the CHEMO ALERT CARD at check-in to the Emergency Department and triage nurse.   

## 2017-06-28 ENCOUNTER — Other Ambulatory Visit: Payer: Self-pay | Admitting: Hematology and Oncology

## 2017-07-02 DIAGNOSIS — M170 Bilateral primary osteoarthritis of knee: Secondary | ICD-10-CM | POA: Diagnosis not present

## 2017-07-02 DIAGNOSIS — I10 Essential (primary) hypertension: Secondary | ICD-10-CM | POA: Diagnosis not present

## 2017-07-02 DIAGNOSIS — G629 Polyneuropathy, unspecified: Secondary | ICD-10-CM | POA: Diagnosis not present

## 2017-07-07 ENCOUNTER — Other Ambulatory Visit: Payer: Self-pay | Admitting: Oncology

## 2017-07-10 NOTE — Progress Notes (Signed)
[name]  [phone] [phone]) [phone]  OFFICE PROGRESS NOTE  ID: [name]   DOB: Nov 24, 1950  MR#: [id number]  OBS#:[id number]  PCP: Ernestene Kiel, MD GYN:  SU:  OTHER MD: Pierre Bali, Johnnette Gourd, Wendee Copp  CC: Metastatic HER-2 positive, estrogen receptor negative breast cancer  CURRENT TREATMENT: Trastuzumab every 28 days; warfarin   INTERVAL HISTORY: Dorrine returns today for follow-up on treatment of her estrogen receptor negative, but HER-2 amplified breast cancer. She is accompanied by her daughter, Andee Poles. She continues on trastuzumab, currently being given every 28 days. She has a dose due today. She reports that she is doing well with the treatment , and her port is working well. She notes some slight diarrhea, light headedness and fatigue following treatment.  Her most recent echocardiogram was Jan 14, 2017.  She will be due for repeat echocardiogram within the next month.  PET scan 04/23/2017 showed no evidence of active malignancy.  She is also on lifetime warfarin. She notes no bleeding issues other than some small bruising.    REVIEW OF SYSTEMS: Lorilee reports that she has been dealing with arthritis in her joints and spasms in her chest and occasionally in her back and neck. She has chest pain in the right side more than the left that comes and goes. She reports that she takes up to 4 tylenol pills on the worst days. She notes that she doesn't usually massage the chest, and she is worried that the pocket of fluid in her chest will continue to stay.  For exercise, she stair climbs in her home. She has good ROM in her arms. Recently she and her husband worked helped someone move into a senior home and was lifting boxes. She repots that she has a new dog named Gladdis, and she might be getting a new one named Rosco. She denies unusual headaches, visual changes, nausea, vomiting, or dizziness. There has  been no unusual cough, phlegm production, or pleurisy. This been no change in bowel or bladder habits. She denies unexplained fatigue or unexplained weight loss, bleeding, rash, or fever. A detailed review of systems was otherwise stable.    BREAST CANCER HISTORY:   From the earlier summary:  [name] is 66 years old [name] female.  This woman has been in good health all of her life.  She noted a swelling and discomfort in her right breast in June 2004.  She was seen in the Emergency Room in [place] and was treated for mastitis.  She was treated for a number of months with mastitis and the swelling did not get better. She was given hydrocodone and Cipro.  Finally, the swelling did get better and ultimately the nipple became retracted and she noticed some dimpling in her skin. She had a mammogram in July of 2004 in [place] with subsequent mammogram on May 27, 2003, by Dr. Isaiah Blakes.  Mammogram done on September 30 showed marked increased density in the left breast.  Biopsy was performed the same day.  It was noted at the 12 o'clock position, deep in the breast was a focal hypoechoic mass, at least 3.5 cm in diameter.  Biopsy did in fact show invasive in situ mammary carcinoma. This was felt to be both at least intermediate, high grade.  No definite lymphovascular invasion was identified. ER and PR negative, Her2 testing positive. Saraya continues to have pain in her breast.  She continues to take hydrocodone a number of times  a day.  She has been seen by Dr. Rosana Hoes, who felt that neoadjuvant chemotherapy would be required.    Initial staging studies showed evidence of liver and lung mets.   Patient also has evidence of bone lesions. Patient started neoadjuvant chemotherapy, Taxotere/Carbo/Herceptin in October 2004.   Patient had a CT scan in December 2004 which demonstrated extensive clot in the SVC innominate vein, bilateral jugular vein and  She was started on anticoagulation therapy. She  received a total of 6 cycles of Taxotere/Carbo/Herceptin, completed in April 2005."  Patient has been on  trastuzumab continued indefinitely; has also received lapatinib and capecitabine for variable intervals in 2007-2008. Most recent echo 12/01/2013 showed an ejection fraction of 55%. She is status post bilateral mastectomies with bilateral axillary lymph node dissection 12/07/2004, showing (a) on the right, a mypT1c ypN1 invasive ductal carcinoma, grade 3, estrogen and progesterone receptor negative, HER-2 positive, with an MIB-1 of 31% (b) on the left, ypT2 ypN1 invasive ductal carcinoma, grade 2, estrogen and progesterone receptor negative, HER-2 positive, with an MIB-1 of 35%. She is Status post radiation June through July of 2006, to the right chest wall, left chest wall, bilateral supraclavicular fossae, and bilateral axillary boosts; with additional radiation to the right and left chest walls and the central chest wall completed November of 2007. She is status post ixempra x9 completed August of 2009. 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 Patient has chronic pain, with negative PET scan 08/24/2013 (no evidence of active cancer). On Neurontin and Tramadol therapy.  Her subsequent history is as detailed below  PAST MEDICAL HISTORY: Past Medical History:  Diagnosis Date  . Breast cancer ([place])    mets to liver and lung  . Breast cancer metastasized to multiple sites ([place]) 02/26/2013  . History of chemotherapy Feb. 2006   taxotere/herceptin/carboplatin  . Hypertension   . Neuropathy   . Radiation 07/31/2006   left upper chest  . Radiation 06/17/2006-06/27/2006   6480 cGy bilat. chest wall  . SVC syndrome   . Thrombosis     PAST SURGICAL HISTORY: Past Surgical History:  Procedure Laterality Date  . ANKLE SURGERY    . BACK SURGERY    . CHOLECYSTECTOMY  1989  . MASTECTOMY Bilateral   . PERIPHERALLY  INSERTED CENTRAL CATHETER INSERTION    . TUBAL LIGATION  1986    FAMILY HISTORY Family History  Problem Relation Age of Onset  . Heart failure Father   . Cancer Father        Prostate cancer  . Heart failure Brother   . Cancer Brother        Prostate cancer  . Diabetes Maternal Aunt    She had three brothers, one died of gunshot wound, one of complications of diabetes mellitus and one of myocardial infarction.  She has no sisters.  Mother died of complications of brain metastasis in 91.  Father has had a myocardial infarction in 1999.  No history of breast or ovarian cancer in the family.    GYNECOLOGIC HISTORY:   Menarche at age 45.  Gravida 3, para 3.  First live birth at age 55.  No history of breast feeding. No history of hormonal replacement therapy.   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 Her husband used to work as a Art gallery manager, but is now retired. She has  three children, Monette who lives in [place] and works as a Hydrographic surveyor, Financial risk analyst who lives in [place] and works as a Administrator, and [place] who lives in [place] and also works as a Hydrographic surveyor. The patient has 12 grandchildren and 4 great-grandchildren. She attends a [employer]. Very involved with school kids.  ADVANCED DIRECTIVES:  Not in place  HEALTH MAINTENANCE: (Updated 06/12/2013) Social History   Tobacco Use  . Smoking status: Never Smoker  . Smokeless tobacco: Never Used  Substance Use Topics  . Alcohol use: Yes    Comment: occasional  . Drug use: No     Colonoscopy: Never and "I don't want one"  PAP:  1987  Bone density:  Never  Lipid panel:  Not on file    Allergies  Allergen Reactions  . Penicillins Hives    PATIENT HAS TOLERATED CEPHALOSPORINGS  . Adhesive [Tape] Other (See Comments)    Tears skin     Current Outpatient Medications  Medication Sig Dispense Refill  . acetaminophen (TYLENOL) 500 MG  tablet Take 1,000 mg by mouth every 6 (six) hours as needed for mild pain or fever.     Marland Kitchen albuterol (PROVENTIL HFA;VENTOLIN HFA) 108 (90 BASE) MCG/ACT inhaler Inhale 2 puffs into the lungs every 6 (six) hours as needed for wheezing. 1 Inhaler 5  . ALPRAZolam (XANAX) 1 MG tablet TAKE 1 TABLET BY MOUTH THREE TIMES DAILY AS NEEDED FOR ANXIETY 60 tablet 0  . amLODipine (NORVASC) 10 MG tablet Take 10 mg by mouth every morning.    . baclofen (LIORESAL) 10 MG tablet Take 1 tablet (10 mg total) by mouth 3 (three) times daily as needed. for muscle spams 270 tablet 3  . carvedilol (COREG) 3.125 MG tablet Take 1 tablet (3.125 mg total) by mouth 2 (two) times daily. 180 tablet 3  . diclofenac sodium (VOLTAREN) 1 % GEL Apply 2 g topically daily as needed (for pain). Apply to knees and shoulders 100 g 6  . furosemide (LASIX) 80 MG tablet Take 80 mg by mouth daily as needed for fluid.     Marland Kitchen gabapentin (NEURONTIN) 300 MG capsule TAKE 2 CAPSULES(600 MG) BY MOUTH THREE TIMES DAILY 540 capsule 0  . losartan (COZAAR) 100 MG tablet Take 100 mg by mouth every morning.    . potassium chloride (K-DUR,KLOR-CON) 10 MEQ tablet Take 4 tablets (40 meq)  when you take Lasix. 120 tablet 5  . spironolactone (ALDACTONE) 25 MG tablet Take 1 tablet (25 mg total) by mouth daily. NEEDS OFFICE VISIT 30 tablet 3  . temazepam (RESTORIL) 30 MG capsule TAKE ONE CAPSULE BY MOUTH EVERY NIGHT AT BEDTIME AS NEEDED FOR SLEEP 30 capsule 2  . warfarin (COUMADIN) 5 MG tablet TAKE 1 TABLET(5 MG) BY MOUTH DAILY 90 tablet 0   No current facility-administered medications for this visit.    Facility-Administered Medications Ordered in Other Visits  Medication Dose Route Frequency Provider Last Rate Last Dose  . sodium chloride flush (NS) 0.9 % injection 10 mL  10 mL Intravenous PRN Magrinat, Virgie Dad, MD   10 mL at 12/15/15 1200    OBJECTIVE: Middle-aged African-American woman who appears stated age  30:   07/16/17 0855  BP: (!) 152/89   Pulse: 76  Resp: 20  Temp: 98.6 F (37 C)  SpO2: 98%     Body mass index is 51.32 kg/m.    ECOG FS: 1 Filed Weights   07/16/17 0855  Weight: 289 lb 11.2 oz (131.4  kg)   Sclerae unicteric, pupils round and equal Oropharynx clear and moist No cervical or supraclavicular adenopathy Lungs no rales or rhonchi Heart regular rate and rhythm Abd soft, nontender, positive bowel sounds MSK no focal spinal tenderness, no upper extremity lymphedema Neuro: nonfocal, well oriented, appropriate affect Breasts: Status post bilateral mastectomies.  There is no evidence of chest wall recurrence.  Both axillae are benign.   LAB RESULTS: Lab Results  Component Value Date   WBC 9.7 07/16/2017   NEUTROABS 6.9 (H) 07/16/2017   HGB 12.3 07/16/2017   HCT 36.4 07/16/2017   MCV 85.5 07/16/2017   PLT 237 07/16/2017      Chemistry      Component Value Date/Time   NA 141 07/16/2017 0809   K 3.9 07/16/2017 0809   CL 107 12/09/2014 0940   CL 104 01/30/2013 0850   CO2 23 07/16/2017 0809   BUN 18.0 07/16/2017 0809   CREATININE 0.8 07/16/2017 0809      Component Value Date/Time   CALCIUM 9.6 07/16/2017 0809   ALKPHOS 71 07/16/2017 0809   AST 11 07/16/2017 0809   ALT 14 07/16/2017 0809   BILITOT 0.98 07/16/2017 0809     STUDIES: No results found.   ASSESSMENT: 66 y.o.  [place], [place], woman  (1)  with a history of inflammatory right breast cancer metastatic at presentation September 2004 with involvement of liver and bone, HER-2 positive, estrogen and progesterone receptor negative  (2) treated with carboplatin, docetaxel and Herceptin x 6 completed April 2005  (3) trastuzumab continued indefinitely;   (a) has also received lapatinib and capecitabine for variable intervals in 2007-2008.  (b) Every 6 month echo: Most recent 01/14/2017 showed an ejection fraction in the 55-60%  (4) status post bilateral mastectomies with bilateral axillary lymph node dissection 12/07/2004,  showing  (a) on the right, a mypT1c ypN1 invasive ductal carcinoma, grade 3, estrogen and progesterone receptor negative, HER-2 positive, with an MIB-1 of 31%  (b) on the left, ypT2 ypN1 invasive ductal carcinoma, grade 2, estrogen and progesterone receptor negative, HER-2 positive, with an MIB-1 of 35%.  (5)  Status post radiation June through July of 2006, to the right chest wall, left chest wall, bilateral supraclavicular fossae, and bilateral axillary boosts; with additional radiation to the right and left chest walls and the central chest wall completed November of 2007  (6) status post Ixempra x9 completed August of 2009.  (7) history of superior vena caval syndrome, on life long anticoagulation   (8)  History of chemotherapy-induced neuropathy.   (9)  chronic pain, with negative PET scan 08/24/2013 (no evidence of active cancer). On Neurontin and Tramadol  (a) repeat PET scan December 2016 again negative.  (b) Repeat PET scan 04/23/2017 shows no active malignancy  (10) right upper extremity cellulitis, no bacteremia; treated with cephalexin /doxycycline for 2 weeks, with resolution  (11 subacute fracture right humerus, nondisplaced, status post fall 03/27/2017   PLAN: Sativa is now 14 years out from definitive diagnosis of metastatic breast cancer, with no evidence of disease activity.  This is very favorable.  We are continuing Herceptin indefinitely as we do not have data that it is safe to stop.  She is receiving Herceptin every 28 days.  She is having echocardiograms every 6 months.  She is due one this month.  I am copying neuro-oncology on this note and have written the order.  She is also on lifetime anticoagulation.  Her INR today is therapeutic.  I  am making no changes in her warfarin.  We will continue to monitor that monthly.  I reassured her that the chest wall discomfort she is experiencing is due to the trauma of her remote surgery and radiation.  I refilled her  Xanax today.  She is taking Tylenol 3-4 times a day which is not going to be dangerous for her  She will see me again in 3 months.  6 months from now we will consider restaging studies.  She knows to call for any other issues that may develop before the next visit.  Magrinat, Virgie Dad, MD  07/16/17 9:17 AM Medical Oncology and Hematology [facility] [address] [place], [place] [address] Tel. [phone]    Fax. [phone]  This document serves as a record of services personally performed by Lurline Del, MD. It was created on his behalf by Sheron Nightingale, a trained medical scribe. The creation of this record is based on the scribe's personal observations and the provider's statements to them.   I have reviewed the above documentation for accuracy and completeness, and I agree with the above.

## 2017-07-16 ENCOUNTER — Other Ambulatory Visit (HOSPITAL_BASED_OUTPATIENT_CLINIC_OR_DEPARTMENT_OTHER): Payer: Medicare Other

## 2017-07-16 ENCOUNTER — Telehealth: Payer: Self-pay | Admitting: *Deleted

## 2017-07-16 ENCOUNTER — Ambulatory Visit (HOSPITAL_BASED_OUTPATIENT_CLINIC_OR_DEPARTMENT_OTHER): Payer: Medicare Other

## 2017-07-16 ENCOUNTER — Ambulatory Visit (HOSPITAL_BASED_OUTPATIENT_CLINIC_OR_DEPARTMENT_OTHER): Payer: Medicare Other | Admitting: Oncology

## 2017-07-16 ENCOUNTER — Telehealth: Payer: Self-pay | Admitting: Oncology

## 2017-07-16 ENCOUNTER — Ambulatory Visit: Payer: Medicare Other

## 2017-07-16 VITALS — BP 152/89 | HR 76 | Temp 98.6°F | Resp 20 | Ht 63.0 in | Wt 289.7 lb

## 2017-07-16 VITALS — BP 136/80 | HR 82 | Temp 98.2°F | Resp 16

## 2017-07-16 DIAGNOSIS — C50911 Malignant neoplasm of unspecified site of right female breast: Secondary | ICD-10-CM

## 2017-07-16 DIAGNOSIS — C50912 Malignant neoplasm of unspecified site of left female breast: Secondary | ICD-10-CM

## 2017-07-16 DIAGNOSIS — Z7901 Long term (current) use of anticoagulants: Secondary | ICD-10-CM

## 2017-07-16 DIAGNOSIS — Z171 Estrogen receptor negative status [ER-]: Secondary | ICD-10-CM

## 2017-07-16 DIAGNOSIS — C50919 Malignant neoplasm of unspecified site of unspecified female breast: Secondary | ICD-10-CM

## 2017-07-16 DIAGNOSIS — Z5112 Encounter for antineoplastic immunotherapy: Secondary | ICD-10-CM | POA: Diagnosis present

## 2017-07-16 DIAGNOSIS — Z95828 Presence of other vascular implants and grafts: Secondary | ICD-10-CM

## 2017-07-16 DIAGNOSIS — C50312 Malignant neoplasm of lower-inner quadrant of left female breast: Secondary | ICD-10-CM

## 2017-07-16 DIAGNOSIS — C50311 Malignant neoplasm of lower-inner quadrant of right female breast: Secondary | ICD-10-CM

## 2017-07-16 LAB — COMPREHENSIVE METABOLIC PANEL
ALT: 14 U/L (ref 0–55)
ANION GAP: 10 meq/L (ref 3–11)
AST: 11 U/L (ref 5–34)
Albumin: 3.7 g/dL (ref 3.5–5.0)
Alkaline Phosphatase: 71 U/L (ref 40–150)
BILIRUBIN TOTAL: 0.98 mg/dL (ref 0.20–1.20)
BUN: 18 mg/dL (ref 7.0–26.0)
CALCIUM: 9.6 mg/dL (ref 8.4–10.4)
CHLORIDE: 108 meq/L (ref 98–109)
CO2: 23 meq/L (ref 22–29)
CREATININE: 0.8 mg/dL (ref 0.6–1.1)
Glucose: 100 mg/dl (ref 70–140)
Potassium: 3.9 mEq/L (ref 3.5–5.1)
Sodium: 141 mEq/L (ref 136–145)
Total Protein: 7.8 g/dL (ref 6.4–8.3)

## 2017-07-16 LAB — CBC WITH DIFFERENTIAL/PLATELET
BASO%: 1.2 % (ref 0.0–2.0)
BASOS ABS: 0.1 10*3/uL (ref 0.0–0.1)
EOS ABS: 0.1 10*3/uL (ref 0.0–0.5)
EOS%: 1 % (ref 0.0–7.0)
HEMATOCRIT: 36.4 % (ref 34.8–46.6)
HEMOGLOBIN: 12.3 g/dL (ref 11.6–15.9)
LYMPH#: 1.9 10*3/uL (ref 0.9–3.3)
LYMPH%: 19.7 % (ref 14.0–49.7)
MCH: 28.9 pg (ref 25.1–34.0)
MCHC: 33.8 g/dL (ref 31.5–36.0)
MCV: 85.5 fL (ref 79.5–101.0)
MONO#: 0.6 10*3/uL (ref 0.1–0.9)
MONO%: 6.5 % (ref 0.0–14.0)
NEUT%: 71.6 % (ref 38.4–76.8)
NEUTROS ABS: 6.9 10*3/uL — AB (ref 1.5–6.5)
Platelets: 237 10*3/uL (ref 145–400)
RBC: 4.26 10*6/uL (ref 3.70–5.45)
RDW: 14 % (ref 11.2–14.5)
WBC: 9.7 10*3/uL (ref 3.9–10.3)

## 2017-07-16 LAB — PROTIME-INR
INR: 2.8 (ref 2.00–3.50)
PROTIME: 33.6 s — AB (ref 10.6–13.4)

## 2017-07-16 MED ORDER — LORAZEPAM 2 MG/ML IJ SOLN
INTRAMUSCULAR | Status: AC
  Filled 2017-07-16: qty 1

## 2017-07-16 MED ORDER — ALPRAZOLAM 1 MG PO TABS: 1.0000 mg | ORAL_TABLET | Freq: Three times a day (TID) | ORAL | 0 refills | Status: DC | PRN

## 2017-07-16 MED ORDER — SODIUM CHLORIDE 0.9 % IV SOLN
Freq: Once | INTRAVENOUS | Status: AC
  Administered 2017-07-16: 11:00:00 via INTRAVENOUS

## 2017-07-16 MED ORDER — HEPARIN SOD (PORK) LOCK FLUSH 100 UNIT/ML IV SOLN
500.0000 [IU] | Freq: Once | INTRAVENOUS | Status: AC | PRN
  Administered 2017-07-16: 500 [IU]
  Filled 2017-07-16: qty 5

## 2017-07-16 MED ORDER — ACETAMINOPHEN 325 MG PO TABS
650.0000 mg | ORAL_TABLET | Freq: Once | ORAL | Status: AC
  Administered 2017-07-16: 650 mg via ORAL

## 2017-07-16 MED ORDER — TRASTUZUMAB CHEMO 150 MG IV SOLR
798.0000 mg | Freq: Once | INTRAVENOUS | Status: AC
  Administered 2017-07-16: 798 mg via INTRAVENOUS
  Filled 2017-07-16: qty 38

## 2017-07-16 MED ORDER — SODIUM CHLORIDE 0.9 % IJ SOLN
10.0000 mL | INTRAMUSCULAR | Status: DC | PRN
  Administered 2017-07-16: 10 mL via INTRAVENOUS
  Filled 2017-07-16: qty 10

## 2017-07-16 MED ORDER — SODIUM CHLORIDE 0.9 % IJ SOLN
10.0000 mL | INTRAMUSCULAR | Status: DC | PRN
  Administered 2017-07-16: 10 mL
  Filled 2017-07-16: qty 10

## 2017-07-16 MED ORDER — LORAZEPAM 2 MG/ML IJ SOLN
1.0000 mg | Freq: Once | INTRAMUSCULAR | Status: AC | PRN
  Administered 2017-07-16: 1 mg via INTRAVENOUS

## 2017-07-16 MED ORDER — ACETAMINOPHEN 325 MG PO TABS
ORAL_TABLET | ORAL | Status: AC
  Filled 2017-07-16: qty 2

## 2017-07-16 MED ORDER — DIPHENHYDRAMINE HCL 25 MG PO CAPS
25.0000 mg | ORAL_CAPSULE | Freq: Once | ORAL | Status: AC
  Administered 2017-07-16: 25 mg via ORAL

## 2017-07-16 MED ORDER — DIPHENHYDRAMINE HCL 25 MG PO CAPS
ORAL_CAPSULE | ORAL | Status: AC
  Filled 2017-07-16: qty 1

## 2017-07-16 NOTE — Patient Instructions (Signed)
Trastuzumab injection for infusion What is this medicine? TRASTUZUMAB (tras TOO zoo mab) is a monoclonal antibody. It is used to treat breast cancer and stomach cancer. This medicine may be used for other purposes; ask your health care provider or pharmacist if you have questions. COMMON BRAND NAME(S): Herceptin What should I tell my health care provider before I take this medicine? They need to know if you have any of these conditions: -heart disease -heart failure -lung or breathing disease, like asthma -an unusual or allergic reaction to trastuzumab, benzyl alcohol, or other medications, foods, dyes, or preservatives -pregnant or trying to get pregnant -breast-feeding How should I use this medicine? This drug is given as an infusion into a vein. It is administered in a hospital or clinic by a specially trained health care professional. Talk to your pediatrician regarding the use of this medicine in children. This medicine is not approved for use in children. Overdosage: If you think you have taken too much of this medicine contact a poison control center or emergency room at once. NOTE: This medicine is only for you. Do not share this medicine with others. What if I miss a dose? It is important not to miss a dose. Call your doctor or health care professional if you are unable to keep an appointment. What may interact with this medicine? This medicine may interact with the following medications: -certain types of chemotherapy, such as daunorubicin, doxorubicin, epirubicin, and idarubicin This list may not describe all possible interactions. Give your health care provider a list of all the medicines, herbs, non-prescription drugs, or dietary supplements you use. Also tell them if you smoke, drink alcohol, or use illegal drugs. Some items may interact with your medicine. What should I watch for while using this medicine? Visit your doctor for checks on your progress. Report any side effects.  Continue your course of treatment even though you feel ill unless your doctor tells you to stop. Call your doctor or health care professional for advice if you get a fever, chills or sore throat, or other symptoms of a cold or flu. Do not treat yourself. Try to avoid being around people who are sick. You may experience fever, chills and shaking during your first infusion. These effects are usually mild and can be treated with other medicines. Report any side effects during the infusion to your health care professional. Fever and chills usually do not happen with later infusions. Do not become pregnant while taking this medicine or for 7 months after stopping it. Women should inform their doctor if they wish to become pregnant or think they might be pregnant. Women of child-bearing potential will need to have a negative pregnancy test before starting this medicine. There is a potential for serious side effects to an unborn child. Talk to your health care professional or pharmacist for more information. Do not breast-feed an infant while taking this medicine or for 7 months after stopping it. Women must use effective birth control with this medicine. What side effects may I notice from receiving this medicine? Side effects that you should report to your doctor or health care professional as soon as possible: -allergic reactions like skin rash, itching or hives, swelling of the face, lips, or tongue -chest pain or palpitations -cough -dizziness -feeling faint or lightheaded, falls -fever -general ill feeling or flu-like symptoms -signs of worsening heart failure like breathing problems; swelling in your legs and feet -unusually weak or tired Side effects that usually do not require medical   attention (report to your doctor or health care professional if they continue or are bothersome): -bone pain -changes in taste -diarrhea -joint pain -nausea/vomiting -weight loss This list may not describe all  possible side effects. Call your doctor for medical advice about side effects. You may report side effects to FDA at 1-800-FDA-1088. Where should I keep my medicine? This drug is given in a hospital or clinic and will not be stored at home. NOTE: This sheet is a summary. It may not cover all possible information. If you have questions about this medicine, talk to your doctor, pharmacist, or health care provider.  2018 Elsevier/Gold Standard (2016-08-07 14:37:52)  

## 2017-07-16 NOTE — Telephone Encounter (Signed)
Per MD -Ok to treat with  ECHO in May 2018.

## 2017-07-16 NOTE — Telephone Encounter (Signed)
Gave patient AVS and calendar of upcoming November through February appointments.

## 2017-07-16 NOTE — Patient Instructions (Signed)

## 2017-07-17 ENCOUNTER — Ambulatory Visit: Payer: Medicare Other | Admitting: Oncology

## 2017-07-17 ENCOUNTER — Other Ambulatory Visit: Payer: Medicare Other

## 2017-07-17 ENCOUNTER — Ambulatory Visit: Payer: Medicare Other

## 2017-07-23 DIAGNOSIS — F419 Anxiety disorder, unspecified: Secondary | ICD-10-CM | POA: Diagnosis not present

## 2017-07-25 ENCOUNTER — Other Ambulatory Visit (HOSPITAL_COMMUNITY): Payer: Medicare Other

## 2017-08-02 ENCOUNTER — Other Ambulatory Visit: Payer: Self-pay | Admitting: Oncology

## 2017-08-03 ENCOUNTER — Other Ambulatory Visit: Payer: Self-pay | Admitting: Nurse Practitioner

## 2017-08-05 ENCOUNTER — Other Ambulatory Visit (HOSPITAL_COMMUNITY): Payer: Medicare Other

## 2017-08-05 ENCOUNTER — Encounter (HOSPITAL_COMMUNITY): Payer: Medicare Other | Admitting: Cardiology

## 2017-08-08 ENCOUNTER — Telehealth: Payer: Self-pay

## 2017-08-08 ENCOUNTER — Other Ambulatory Visit: Payer: Self-pay

## 2017-08-08 NOTE — Telephone Encounter (Signed)
Pt called stating that her grand daughter threw away her bottle of warfarin and that her pharmacy would not refill the medication d/t Warfarin was filled on 12/07 for 90 day supply.  This RN instructed pt to call her insurance company and explain to them what happened to see if they would agree to cover a new prescription other wise she would need to pay out of pocket.  This RN reached out to [place], oncology pharmacist on site for further guidance.  She suggested that pt have her pharmacy call her insurance company to request an override d/t circumstances as explained by pt.  Other suggestion given was  the [facility] may be able to supply free med to pt.  Spoke with rep at [facility] who stated that if pt is insured they could not supply free medication but could certainly fill a prescription if pt could pay.  This rep also suggested pt call or have her pharmacy call the insurance company to request override.  This RN called number provided for pt and obtained vm.  msg left explaining above and requested that pt call this office back tomorrow to follow up.

## 2017-08-14 ENCOUNTER — Other Ambulatory Visit: Payer: Self-pay | Admitting: *Deleted

## 2017-08-14 ENCOUNTER — Other Ambulatory Visit (HOSPITAL_BASED_OUTPATIENT_CLINIC_OR_DEPARTMENT_OTHER): Payer: Medicare Other

## 2017-08-14 ENCOUNTER — Ambulatory Visit (HOSPITAL_BASED_OUTPATIENT_CLINIC_OR_DEPARTMENT_OTHER): Payer: Medicare Other

## 2017-08-14 VITALS — BP 137/82 | HR 63 | Temp 98.6°F | Resp 20

## 2017-08-14 DIAGNOSIS — Z7901 Long term (current) use of anticoagulants: Secondary | ICD-10-CM | POA: Diagnosis not present

## 2017-08-14 DIAGNOSIS — C50912 Malignant neoplasm of unspecified site of left female breast: Secondary | ICD-10-CM

## 2017-08-14 DIAGNOSIS — C50911 Malignant neoplasm of unspecified site of right female breast: Secondary | ICD-10-CM | POA: Diagnosis not present

## 2017-08-14 DIAGNOSIS — Z5112 Encounter for antineoplastic immunotherapy: Secondary | ICD-10-CM

## 2017-08-14 DIAGNOSIS — C50919 Malignant neoplasm of unspecified site of unspecified female breast: Secondary | ICD-10-CM

## 2017-08-14 LAB — COMPREHENSIVE METABOLIC PANEL
ALBUMIN: 3.5 g/dL (ref 3.5–5.0)
ALK PHOS: 73 U/L (ref 40–150)
ALT: 10 U/L (ref 0–55)
AST: 10 U/L (ref 5–34)
Anion Gap: 9 mEq/L (ref 3–11)
BILIRUBIN TOTAL: 0.62 mg/dL (ref 0.20–1.20)
BUN: 14.9 mg/dL (ref 7.0–26.0)
CO2: 24 meq/L (ref 22–29)
Calcium: 9.4 mg/dL (ref 8.4–10.4)
Chloride: 110 mEq/L — ABNORMAL HIGH (ref 98–109)
Creatinine: 0.9 mg/dL (ref 0.6–1.1)
EGFR: >60 mL/min/{1.73_m2} (ref >60)
GLUCOSE: 87 mg/dL (ref 70–140)
POTASSIUM: 3.8 meq/L (ref 3.5–5.1)
SODIUM: 143 meq/L (ref 136–145)
TOTAL PROTEIN: 7.4 g/dL (ref 6.4–8.3)

## 2017-08-14 LAB — PROTIME-INR
INR: 2.6 (ref 2.00–3.50)
Protime: 31.2 Seconds — ABNORMAL HIGH (ref 10.6–13.4)

## 2017-08-14 LAB — CBC WITH DIFFERENTIAL/PLATELET
BASO%: 0.3 % (ref 0.0–2.0)
Basophils Absolute: 0 10*3/uL (ref 0.0–0.1)
EOS%: 2.7 % (ref 0.0–7.0)
Eosinophils Absolute: 0.2 10*3/uL (ref 0.0–0.5)
HCT: 34.7 % — ABNORMAL LOW (ref 34.8–46.6)
HGB: 12 g/dL (ref 11.6–15.9)
LYMPH#: 2 10*3/uL (ref 0.9–3.3)
LYMPH%: 28.9 % (ref 14.0–49.7)
MCH: 29 pg (ref 25.1–34.0)
MCHC: 34.6 g/dL (ref 31.5–36.0)
MCV: 83.8 fL (ref 79.5–101.0)
MONO#: 0.5 10*3/uL (ref 0.1–0.9)
MONO%: 7.7 % (ref 0.0–14.0)
NEUT%: 60.4 % (ref 38.4–76.8)
NEUTROS ABS: 4.2 10*3/uL (ref 1.5–6.5)
Platelets: 236 10*3/uL (ref 145–400)
RBC: 4.14 10*6/uL (ref 3.70–5.45)
RDW: 14.7 % — ABNORMAL HIGH (ref 11.2–14.5)
WBC: 7 10*3/uL (ref 3.9–10.3)

## 2017-08-14 MED ORDER — TEMAZEPAM 30 MG PO CAPS: ORAL_CAPSULE | ORAL | 2 refills | Status: DC

## 2017-08-14 MED ORDER — SODIUM CHLORIDE 0.9 % IV SOLN
Freq: Once | INTRAVENOUS | Status: AC
  Administered 2017-08-14: 09:00:00 via INTRAVENOUS

## 2017-08-14 MED ORDER — DIPHENHYDRAMINE HCL 25 MG PO CAPS
25.0000 mg | ORAL_CAPSULE | Freq: Once | ORAL | Status: AC
  Administered 2017-08-14: 25 mg via ORAL

## 2017-08-14 MED ORDER — SODIUM CHLORIDE 0.9 % IV SOLN
798.0000 mg | Freq: Once | INTRAVENOUS | Status: AC
  Administered 2017-08-14: 798 mg via INTRAVENOUS
  Filled 2017-08-14: qty 38

## 2017-08-14 MED ORDER — LORAZEPAM 2 MG/ML IJ SOLN
1.0000 mg | Freq: Once | INTRAMUSCULAR | Status: AC | PRN
  Administered 2017-08-14: 1 mg via INTRAVENOUS

## 2017-08-14 MED ORDER — ALPRAZOLAM 1 MG PO TABS: 1.0000 mg | ORAL_TABLET | Freq: Three times a day (TID) | ORAL | 0 refills | Status: DC | PRN

## 2017-08-14 MED ORDER — DIPHENHYDRAMINE HCL 25 MG PO CAPS
ORAL_CAPSULE | ORAL | Status: AC
  Filled 2017-08-14: qty 1

## 2017-08-14 MED ORDER — BACLOFEN 10 MG PO TABS: 10.0000 mg | ORAL_TABLET | Freq: Three times a day (TID) | ORAL | 3 refills | Status: DC | PRN

## 2017-08-14 MED ORDER — ACETAMINOPHEN 325 MG PO TABS
ORAL_TABLET | ORAL | Status: AC
  Filled 2017-08-14: qty 2

## 2017-08-14 MED ORDER — HEPARIN SOD (PORK) LOCK FLUSH 100 UNIT/ML IV SOLN
500.0000 [IU] | Freq: Once | INTRAVENOUS | Status: AC | PRN
  Administered 2017-08-14: 500 [IU]
  Filled 2017-08-14: qty 5

## 2017-08-14 MED ORDER — ACETAMINOPHEN 325 MG PO TABS
650.0000 mg | ORAL_TABLET | Freq: Once | ORAL | Status: AC
  Administered 2017-08-14: 650 mg via ORAL

## 2017-08-14 MED ORDER — LORAZEPAM 2 MG/ML IJ SOLN
INTRAMUSCULAR | Status: AC
  Filled 2017-08-14: qty 1

## 2017-08-14 MED ORDER — SODIUM CHLORIDE 0.9 % IJ SOLN
10.0000 mL | INTRAMUSCULAR | Status: DC | PRN
  Administered 2017-08-14: 10 mL
  Filled 2017-08-14: qty 10

## 2017-08-16 DIAGNOSIS — M170 Bilateral primary osteoarthritis of knee: Secondary | ICD-10-CM | POA: Diagnosis not present

## 2017-09-05 ENCOUNTER — Other Ambulatory Visit (HOSPITAL_COMMUNITY): Payer: Self-pay | Admitting: *Deleted

## 2017-09-05 MED ORDER — SPIRONOLACTONE 25 MG PO TABS: 25.0000 mg | ORAL_TABLET | Freq: Every day | ORAL | 3 refills | Status: DC

## 2017-09-11 ENCOUNTER — Inpatient Hospital Stay: Payer: Medicare Other | Attending: Oncology

## 2017-09-11 ENCOUNTER — Other Ambulatory Visit: Payer: Self-pay | Admitting: *Deleted

## 2017-09-11 ENCOUNTER — Inpatient Hospital Stay: Payer: Medicare Other

## 2017-09-11 VITALS — BP 130/70 | HR 64 | Temp 98.6°F | Resp 19

## 2017-09-11 DIAGNOSIS — C50812 Malignant neoplasm of overlapping sites of left female breast: Secondary | ICD-10-CM | POA: Insufficient documentation

## 2017-09-11 DIAGNOSIS — Z7901 Long term (current) use of anticoagulants: Secondary | ICD-10-CM | POA: Diagnosis not present

## 2017-09-11 DIAGNOSIS — C50919 Malignant neoplasm of unspecified site of unspecified female breast: Secondary | ICD-10-CM

## 2017-09-11 DIAGNOSIS — Z5112 Encounter for antineoplastic immunotherapy: Secondary | ICD-10-CM | POA: Insufficient documentation

## 2017-09-11 DIAGNOSIS — C50911 Malignant neoplasm of unspecified site of right female breast: Secondary | ICD-10-CM

## 2017-09-11 DIAGNOSIS — C7951 Secondary malignant neoplasm of bone: Secondary | ICD-10-CM | POA: Diagnosis not present

## 2017-09-11 DIAGNOSIS — C50912 Malignant neoplasm of unspecified site of left female breast: Secondary | ICD-10-CM

## 2017-09-11 LAB — CBC WITH DIFFERENTIAL/PLATELET
BASOS PCT: 0 %
Basophils Absolute: 0 10*3/uL (ref 0.0–0.1)
EOS ABS: 0.1 10*3/uL (ref 0.0–0.5)
EOS PCT: 1 %
HCT: 35.6 % (ref 34.8–46.6)
HEMOGLOBIN: 12.1 g/dL (ref 11.6–15.9)
LYMPHS ABS: 1.8 10*3/uL (ref 0.9–3.3)
Lymphocytes Relative: 21 %
MCH: 28.3 pg (ref 25.1–34.0)
MCHC: 34 g/dL (ref 31.5–36.0)
MCV: 83.4 fL (ref 79.5–101.0)
MONO ABS: 0.7 10*3/uL (ref 0.1–0.9)
MONOS PCT: 8 %
NEUTROS PCT: 70 %
Neutro Abs: 6.1 10*3/uL (ref 1.5–6.5)
Platelets: 213 10*3/uL (ref 145–400)
RBC: 4.27 MIL/uL (ref 3.70–5.45)
RDW: 15.1 % (ref 11.2–16.1)
WBC: 8.7 10*3/uL (ref 3.9–10.3)

## 2017-09-11 LAB — COMPREHENSIVE METABOLIC PANEL
ALBUMIN: 3.5 g/dL (ref 3.5–5.0)
ALK PHOS: 71 U/L (ref 40–150)
ALT: 14 U/L (ref 0–55)
AST: 9 U/L (ref 5–34)
Anion gap: 10 (ref 3–11)
BUN: 31 mg/dL — AB (ref 7–26)
CALCIUM: 9.4 mg/dL (ref 8.4–10.4)
CHLORIDE: 108 mmol/L (ref 98–109)
CO2: 25 mmol/L (ref 22–29)
CREATININE: 1.23 mg/dL — AB (ref 0.60–1.10)
GFR calc Af Amer: 52 mL/min — ABNORMAL LOW (ref >60)
GFR calc non Af Amer: 45 mL/min — ABNORMAL LOW (ref >60)
GLUCOSE: 97 mg/dL (ref 70–140)
Potassium: 4.1 mmol/L (ref 3.3–4.7)
SODIUM: 143 mmol/L (ref 136–145)
Total Bilirubin: 0.7 mg/dL (ref 0.2–1.2)
Total Protein: 7.4 g/dL (ref 6.4–8.3)

## 2017-09-11 LAB — PROTIME-INR
INR: 2.29
Prothrombin Time: 25 seconds — ABNORMAL HIGH (ref 11.4–15.2)

## 2017-09-11 MED ORDER — LORAZEPAM 2 MG/ML IJ SOLN
INTRAMUSCULAR | Status: AC
  Filled 2017-09-11: qty 1

## 2017-09-11 MED ORDER — LORAZEPAM 2 MG/ML IJ SOLN
1.0000 mg | Freq: Once | INTRAMUSCULAR | Status: AC | PRN
  Administered 2017-09-11: 1 mg via INTRAVENOUS

## 2017-09-11 MED ORDER — ACETAMINOPHEN 325 MG PO TABS
ORAL_TABLET | ORAL | Status: AC
  Filled 2017-09-11: qty 2

## 2017-09-11 MED ORDER — ACETAMINOPHEN 325 MG PO TABS
650.0000 mg | ORAL_TABLET | Freq: Once | ORAL | Status: AC
  Administered 2017-09-11: 650 mg via ORAL

## 2017-09-11 MED ORDER — SODIUM CHLORIDE 0.9 % IJ SOLN
10.0000 mL | INTRAMUSCULAR | Status: DC | PRN
  Administered 2017-09-11: 10 mL
  Filled 2017-09-11: qty 10

## 2017-09-11 MED ORDER — DIPHENHYDRAMINE HCL 25 MG PO CAPS
ORAL_CAPSULE | ORAL | Status: AC
  Filled 2017-09-11: qty 1

## 2017-09-11 MED ORDER — HEPARIN SOD (PORK) LOCK FLUSH 100 UNIT/ML IV SOLN
500.0000 [IU] | Freq: Once | INTRAVENOUS | Status: AC | PRN
  Administered 2017-09-11: 500 [IU]
  Filled 2017-09-11: qty 5

## 2017-09-11 MED ORDER — SODIUM CHLORIDE 0.9 % IV SOLN
Freq: Once | INTRAVENOUS | Status: AC
  Administered 2017-09-11: 09:00:00 via INTRAVENOUS

## 2017-09-11 MED ORDER — DIPHENHYDRAMINE HCL 25 MG PO CAPS
25.0000 mg | ORAL_CAPSULE | Freq: Once | ORAL | Status: AC
  Administered 2017-09-11: 25 mg via ORAL

## 2017-09-11 MED ORDER — ALPRAZOLAM 1 MG PO TABS: 1.0000 mg | ORAL_TABLET | Freq: Three times a day (TID) | ORAL | 0 refills | Status: DC | PRN

## 2017-09-11 MED ORDER — TRASTUZUMAB CHEMO 150 MG IV SOLR
798.0000 mg | Freq: Once | INTRAVENOUS | Status: AC
  Administered 2017-09-11: 798 mg via INTRAVENOUS
  Filled 2017-09-11: qty 38

## 2017-09-11 NOTE — Patient Instructions (Signed)
[facility] Discharge Instructions for Patients Receiving Chemotherapy  Today you received the following chemotherapy agents Herceptin  To help prevent nausea and vomiting after your treatment, we encourage you to take your nausea medication as directed   If you develop nausea and vomiting that is not controlled by your nausea medication, call the clinic.   BELOW ARE SYMPTOMS THAT SHOULD BE REPORTED IMMEDIATELY:  *FEVER GREATER THAN 100.5 F  *CHILLS WITH OR WITHOUT FEVER  NAUSEA AND VOMITING THAT IS NOT CONTROLLED WITH YOUR NAUSEA MEDICATION  *UNUSUAL SHORTNESS OF BREATH  *UNUSUAL BRUISING OR BLEEDING  TENDERNESS IN MOUTH AND THROAT WITH OR WITHOUT PRESENCE OF ULCERS  *URINARY PROBLEMS  *BOWEL PROBLEMS  UNUSUAL RASH Items with * indicate a potential emergency and should be followed up as soon as possible.  Feel free to call the clinic should you have any questions or concerns. The clinic phone number is [phone].  Please show the CHEMO ALERT CARD at check-in to the Emergency Department and triage nurse.   

## 2017-09-24 ENCOUNTER — Ambulatory Visit (HOSPITAL_COMMUNITY)
Admission: RE | Admit: 2017-09-24 | Discharge: 2017-09-24 | Disposition: A | Discharge status: Home / Self Care | Payer: Medicare Other | Source: Ambulatory Visit | Attending: Cardiology | Admitting: Cardiology

## 2017-09-24 ENCOUNTER — Other Ambulatory Visit: Payer: Self-pay

## 2017-09-24 ENCOUNTER — Ambulatory Visit (HOSPITAL_BASED_OUTPATIENT_CLINIC_OR_DEPARTMENT_OTHER)
Admission: RE | Admit: 2017-09-24 | Discharge: 2017-09-24 | Disposition: A | Discharge status: Home / Self Care | Payer: Medicare Other | Source: Ambulatory Visit | Attending: Cardiology | Admitting: Cardiology

## 2017-09-24 ENCOUNTER — Encounter (HOSPITAL_COMMUNITY): Payer: Self-pay | Admitting: Cardiology

## 2017-09-24 VITALS — BP 155/85 | HR 75 | Wt 287.5 lb

## 2017-09-24 DIAGNOSIS — I5032 Chronic diastolic (congestive) heart failure: Secondary | ICD-10-CM

## 2017-09-24 DIAGNOSIS — C50919 Malignant neoplasm of unspecified site of unspecified female breast: Secondary | ICD-10-CM | POA: Insufficient documentation

## 2017-09-24 DIAGNOSIS — I509 Heart failure, unspecified: Secondary | ICD-10-CM

## 2017-09-24 MED ORDER — FUROSEMIDE 40 MG PO TABS: 40.0000 mg | ORAL_TABLET | Freq: Every day | ORAL | 3 refills | Status: DC

## 2017-09-24 MED ORDER — CARVEDILOL 6.25 MG PO TABS: 6.2500 mg | ORAL_TABLET | Freq: Two times a day (BID) | ORAL | 3 refills | Status: DC

## 2017-09-24 MED ORDER — POTASSIUM CHLORIDE CRYS ER 20 MEQ PO TBCR: 20.0000 meq | EXTENDED_RELEASE_TABLET | Freq: Every day | ORAL | 3 refills | Status: DC

## 2017-09-24 NOTE — Patient Instructions (Signed)
Increase Carvadilol 6.25 mg (1 tab), twice a day  Take Furosemide 40 mg (1 tab) daily  Take potassium 20 meq (1 tab) daily  Your physician has requested that you have an echocardiogram. Echocardiography is a painless test that uses sound waves to create images of your heart. It provides your doctor with information about the size and shape of your heart and how well your heart's chambers and valves are working. This procedure takes approximately one hour. There are no restrictions for this procedure.  Your physician recommends that you schedule a follow-up appointment in: 6 months with Dr. Aundra Dubin  an a echocardiogram

## 2017-09-24 NOTE — Progress Notes (Signed)
  Echocardiogram 2D Echocardiogram has been performed.  [name] F 09/24/2017, 3:08 PM

## 2017-09-24 NOTE — Progress Notes (Signed)
Advanced Heart Failure Clinic Note   Patient ID: [name], female   DOB: September 05, 1950, 67 y.o.   MRN: [id number] Oncologist: Dr Jana Hakim  HPI: [name] is a 67 year old with a history of metastatic HER-2 positive breast carcinoma originally diagnosed September 2004. Started in R breast. Underwent neo-adjuvant chemo. During this time developed L breast CA. Underwent bilateral mastectomy. Lymph nodes +. 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   She received a total of 6 cycles of Taxotere/Carbo/Herceptin, completed in April 2005, after which she began single agent Herceptin, given every 4 weeks now. Plan to continue on Herceptin indefinitely.   She returns for cardio-oncology followup.  She has been stable symptomatically.  Not getting much exercise.  She is short of breath walking about 100 yards.  She takes 80 mg Lasix several times a month, does not take regularly as it causes her to cramp.  Her legs have been swelling.  No chest pain.  No lightheadedness.  No palpitations.    Labs 11/15: K 4, creatinine 1.0   Labs 1/16: K 4.1, creatinine 1.0 Labs 8/16: K 4.3, creatinine 0.9 Labs 11/16: K 3.9, creatinine 0.9 Labs 5/17: K 3.9, creatinine 0.68 Labs 5/18: K 4, creatinine 0.65 Labs 1/19: K 4.1, creatinine 1.23  04/23/12 ECHO EF 60-65% Lateral s' 8.9 cm/s  07/30/12 ECHO EF 60-65% Lateral s' 8.3 cm/s  09/11/12 ECHO EF 60-65% Lateral s' 10.3 cm/s  12/22/12 ECHO 55-60% Lateral S' 9.8 RV mildly dilated  07/01/13 ECHO 55-60%, lateral s' 9.79, mild RV dilation, grade II DD 3/15 ECHO 55%, mild MR, lateral s' 9.6, GLS -19.2% 03/02/14 ECHO EF 55-60% Lateral S' 9.4 GLS - 21.9  11/15 ECHO EF 60-65%, lateral S' 6.8, GLS -17.2%, mild RV dilation with normal systolic function, mild MR.  2/16 ECHO EF 60-65%, lateral S' 14.4, GLS -40.9%, normal diastolic function, mild RV dilation with normal RV systolic function.  8/16 ECHO EF 60-65%, mild LVH, normal RV  size and systolic function, lateral s' 13.2, GLS -17%.  11/16 ECHO EF 60-65% Lateral S' 10.2, GLS -20.2% 5/17 ECHO EF 60-65%, mild LVH, lateral s' 12.2, GLS -20.8%, grade II diastolic dysfunction, normal RV. 9/17 ECHO EF 81-19%, normal diastolic function, GLS -14.7% 12/17 ECHO EF 55-60%, moderate diastolic dysfunction, GLS -20.1%, normal RV size and systolic function 8/29 ECHO EF 55-60%, GLS -56.2%, normal diastolic function, normal RV size and systolic function.  1/19 ECHO EF 55-60%, GLS -13.0%, normal diastolic function, RV normal size and systolic function.    ROS: All systems negative except as listed in HPI, PMH and Problem List.  Past Medical History:  Diagnosis Date  . Breast cancer ([place])    mets to liver and lung  . Breast cancer metastasized to multiple sites ([place]) 02/26/2013  . History of chemotherapy Feb. 2006   taxotere/herceptin/carboplatin  . Hypertension   . Neuropathy   . Radiation 07/31/2006   left upper chest  . Radiation 06/17/2006-06/27/2006   6480 cGy bilat. chest wall  . SVC syndrome   . Thrombosis     Current Outpatient Medications  Medication Sig Dispense Refill  . acetaminophen (TYLENOL) 500 MG tablet Take 1,000 mg by mouth every 6 (six) hours as needed for mild pain or fever.     Marland Kitchen albuterol (PROVENTIL HFA;VENTOLIN HFA) 108 (90 BASE) MCG/ACT inhaler Inhale 2 puffs into the lungs every 6 (six) hours as needed for wheezing. 1 Inhaler 5  . ALPRAZolam (XANAX) 1 MG tablet Take 1  tablet (1 mg total) by mouth 3 (three) times daily as needed. for anxiety 60 tablet 0  . amLODipine (NORVASC) 10 MG tablet Take 10 mg by mouth every morning.    . baclofen (LIORESAL) 10 MG tablet Take 1 tablet (10 mg total) by mouth 3 (three) times daily as needed. for muscle spams 270 tablet 3  . carvedilol (COREG) 6.25 MG tablet Take 1 tablet (6.25 mg total) by mouth 2 (two) times daily. 60 tablet 3  . diclofenac sodium (VOLTAREN) 1 % GEL Apply 2 g topically daily as needed (for pain).  Apply to knees and shoulders 100 g 6  . furosemide (LASIX) 40 MG tablet Take 1 tablet (40 mg total) by mouth daily. 30 tablet 3  . gabapentin (NEURONTIN) 300 MG capsule TAKE 2 CAPSULES(600 MG) BY MOUTH THREE TIMES DAILY 540 capsule 0  . losartan (COZAAR) 100 MG tablet Take 100 mg by mouth every morning.    . potassium chloride (K-DUR,KLOR-CON) 20 MEQ tablet Take 1 tablet (20 mEq total) by mouth daily. 30 tablet 3  . spironolactone (ALDACTONE) 25 MG tablet Take 1 tablet (25 mg total) by mouth daily. NEEDS OFFICE VISIT 30 tablet 3  . temazepam (RESTORIL) 30 MG capsule TAKE ONE CAPSULE BY MOUTH EVERY NIGHT AT BEDTIME AS NEEDED FOR SLEEP 30 capsule 2  . warfarin (COUMADIN) 5 MG tablet TAKE 1 TABLET(5 MG) BY MOUTH DAILY 90 tablet 0   No current facility-administered medications for this encounter.    Facility-Administered Medications Ordered in Other Encounters  Medication Dose Route Frequency Provider Last Rate Last Dose  . sodium chloride flush (NS) 0.9 % injection 10 mL  10 mL Intravenous PRN Magrinat, Virgie Dad, MD   10 mL at 12/15/15 1200    Vitals:   09/24/17 1510  BP: (!) 155/85  Pulse: 75  SpO2: 99%  Weight: 287 lb 8 oz (130.4 kg)    PHYSICAL EXAM: General: NAD Neck: JVP 8-9 cm, no thyromegaly or thyroid nodule.  Lungs: Clear to auscultation bilaterally with normal respiratory effort. CV: Nondisplaced PMI.  Heart regular S1/S2, no S3/S4, no murmur.  1+ edema 1/2 to knees bilaterally.  No carotid bruit.  Normal pedal pulses.  Abdomen: Soft, nontender, no hepatosplenomegaly, no distention.  Skin: Intact without lesions or rashes.  Neurologic: Alert and oriented x 3.  Psych: Normal affect. Extremities: No clubbing or cyanosis.  HEENT: Normal.    ASSESSMENT & PLAN: 1) L Breast Cancer s/p bilateral mastectomies:  Symptomatically stable. Echo reviewed today, EF and strain stable.  She will be continuing Herceptin indefinitely, now getting every 4 wks.   - Repeat echo in 6 months.    2) Suspected OSA: She has not wanted to do a sleep study.    3) HTN: BP high, increase Coreg to 6.25 mg bid.  4) Chronic diastolic CHF: She does have volume overload on exam, NYHA class II-III symptoms (though obesity/deconditioning likely plays a role).  - Take Lasix 40 mg daily rather than 80 mg prn.  She will also start KCl 20 daily.  [facility] 1 week at [facility].   Followup 6 months with echo.   Loralie Champagne 09/24/2017

## 2017-09-30 ENCOUNTER — Telehealth: Payer: Self-pay | Admitting: Oncology

## 2017-09-30 NOTE — Telephone Encounter (Signed)
Returned call to patient.  Asked her to call back and advise exactly what she needed.

## 2017-10-04 DIAGNOSIS — I10 Essential (primary) hypertension: Secondary | ICD-10-CM | POA: Diagnosis not present

## 2017-10-04 DIAGNOSIS — R600 Localized edema: Secondary | ICD-10-CM | POA: Diagnosis not present

## 2017-10-04 DIAGNOSIS — C50911 Malignant neoplasm of unspecified site of right female breast: Secondary | ICD-10-CM | POA: Diagnosis not present

## 2017-10-04 DIAGNOSIS — F419 Anxiety disorder, unspecified: Secondary | ICD-10-CM | POA: Diagnosis not present

## 2017-10-04 DIAGNOSIS — C50912 Malignant neoplasm of unspecified site of left female breast: Secondary | ICD-10-CM | POA: Diagnosis not present

## 2017-10-04 DIAGNOSIS — M170 Bilateral primary osteoarthritis of knee: Secondary | ICD-10-CM | POA: Diagnosis not present
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
## 2017-10-09 ENCOUNTER — Inpatient Hospital Stay: Payer: Medicare Other | Attending: Oncology

## 2017-10-09 ENCOUNTER — Ambulatory Visit: Payer: Medicare Other | Admitting: Adult Health

## 2017-10-09 ENCOUNTER — Inpatient Hospital Stay: Payer: Medicare Other

## 2017-10-09 ENCOUNTER — Telehealth: Payer: Self-pay | Admitting: Oncology

## 2017-10-09 ENCOUNTER — Other Ambulatory Visit: Payer: Medicare Other

## 2017-10-09 ENCOUNTER — Inpatient Hospital Stay (HOSPITAL_BASED_OUTPATIENT_CLINIC_OR_DEPARTMENT_OTHER): Payer: Medicare Other | Admitting: Oncology

## 2017-10-09 VITALS — BP 138/73 | HR 72 | Temp 98.7°F | Resp 18 | Ht 63.0 in | Wt 285.2 lb

## 2017-10-09 DIAGNOSIS — Z170 Estrogen receptor positive status [ER+]: Principal | ICD-10-CM

## 2017-10-09 DIAGNOSIS — C50919 Malignant neoplasm of unspecified site of unspecified female breast: Secondary | ICD-10-CM

## 2017-10-09 DIAGNOSIS — C7951 Secondary malignant neoplasm of bone: Secondary | ICD-10-CM

## 2017-10-09 DIAGNOSIS — C50912 Malignant neoplasm of unspecified site of left female breast: Secondary | ICD-10-CM | POA: Diagnosis not present

## 2017-10-09 DIAGNOSIS — Z5112 Encounter for antineoplastic immunotherapy: Secondary | ICD-10-CM | POA: Diagnosis not present

## 2017-10-09 DIAGNOSIS — F419 Anxiety disorder, unspecified: Secondary | ICD-10-CM

## 2017-10-09 DIAGNOSIS — C50812 Malignant neoplasm of overlapping sites of left female breast: Principal | ICD-10-CM

## 2017-10-09 DIAGNOSIS — C50911 Malignant neoplasm of unspecified site of right female breast: Secondary | ICD-10-CM

## 2017-10-09 DIAGNOSIS — Z7901 Long term (current) use of anticoagulants: Secondary | ICD-10-CM | POA: Insufficient documentation

## 2017-10-09 DIAGNOSIS — Z95828 Presence of other vascular implants and grafts: Secondary | ICD-10-CM

## 2017-10-09 DIAGNOSIS — C50811 Malignant neoplasm of overlapping sites of right female breast: Secondary | ICD-10-CM

## 2017-10-09 LAB — CBC WITH DIFFERENTIAL/PLATELET
Basophils Absolute: 0.1 10*3/uL (ref 0.0–0.1)
Basophils Relative: 1 %
EOS PCT: 1 %
Eosinophils Absolute: 0.1 10*3/uL (ref 0.0–0.5)
HEMATOCRIT: 36.2 % (ref 34.8–46.6)
Hemoglobin: 12.2 g/dL (ref 11.6–15.9)
LYMPHS ABS: 2.4 10*3/uL (ref 0.9–3.3)
LYMPHS PCT: 29 %
MCH: 28.1 pg (ref 25.1–34.0)
MCHC: 33.7 g/dL (ref 31.5–36.0)
MCV: 83.5 fL (ref 79.5–101.0)
MONO ABS: 0.7 10*3/uL (ref 0.1–0.9)
MONOS PCT: 8 %
NEUTROS ABS: 5.1 10*3/uL (ref 1.5–6.5)
Neutrophils Relative %: 61 %
PLATELETS: 223 10*3/uL (ref 145–400)
RBC: 4.34 MIL/uL (ref 3.70–5.45)
RDW: 15 % — AB (ref 11.2–14.5)
WBC: 8.3 10*3/uL (ref 3.9–10.3)

## 2017-10-09 LAB — COMPREHENSIVE METABOLIC PANEL
ALBUMIN: 3.5 g/dL (ref 3.5–5.0)
ALT: 13 U/L (ref 0–55)
AST: 10 U/L (ref 5–34)
Alkaline Phosphatase: 64 U/L (ref 40–150)
Anion gap: 9 (ref 3–11)
BILIRUBIN TOTAL: 0.7 mg/dL (ref 0.2–1.2)
BUN: 22 mg/dL (ref 7–26)
CHLORIDE: 106 mmol/L (ref 98–109)
CO2: 26 mmol/L (ref 22–29)
Calcium: 9.2 mg/dL (ref 8.4–10.4)
Creatinine, Ser: 0.84 mg/dL (ref 0.60–1.10)
GFR calc Af Amer: >60 mL/min (ref >60)
GFR calc non Af Amer: >60 mL/min (ref >60)
GLUCOSE: 89 mg/dL (ref 70–140)
Potassium: 3.6 mmol/L (ref 3.5–5.1)
SODIUM: 141 mmol/L (ref 136–145)
TOTAL PROTEIN: 7.3 g/dL (ref 6.4–8.3)

## 2017-10-09 LAB — PROTIME-INR
INR: 2.01
Prothrombin Time: 22.6 seconds — ABNORMAL HIGH (ref 11.4–15.2)

## 2017-10-09 MED ORDER — DIPHENHYDRAMINE HCL 25 MG PO CAPS
ORAL_CAPSULE | ORAL | Status: AC
  Filled 2017-10-09: qty 2

## 2017-10-09 MED ORDER — SODIUM CHLORIDE 0.9 % IJ SOLN
10.0000 mL | INTRAMUSCULAR | Status: DC | PRN
  Administered 2017-10-09: 10 mL via INTRAVENOUS
  Filled 2017-10-09: qty 10

## 2017-10-09 MED ORDER — SODIUM CHLORIDE 0.9 % IV SOLN
Freq: Once | INTRAVENOUS | Status: AC
  Administered 2017-10-09: 09:00:00 via INTRAVENOUS

## 2017-10-09 MED ORDER — SODIUM CHLORIDE 0.9 % IV SOLN
798.0000 mg | Freq: Once | INTRAVENOUS | Status: AC
  Administered 2017-10-09: 798 mg via INTRAVENOUS
  Filled 2017-10-09: qty 38

## 2017-10-09 MED ORDER — LORAZEPAM 2 MG/ML IJ SOLN
1.0000 mg | Freq: Once | INTRAMUSCULAR | Status: AC | PRN
  Administered 2017-10-09: 1 mg via INTRAVENOUS

## 2017-10-09 MED ORDER — ACETAMINOPHEN 325 MG PO TABS
ORAL_TABLET | ORAL | Status: AC
  Filled 2017-10-09: qty 2

## 2017-10-09 MED ORDER — ALPRAZOLAM 1 MG PO TABS: 1.0000 mg | ORAL_TABLET | Freq: Three times a day (TID) | ORAL | 0 refills | Status: DC | PRN

## 2017-10-09 MED ORDER — LORAZEPAM 1 MG PO TABS
ORAL_TABLET | ORAL | Status: AC
  Filled 2017-10-09: qty 1

## 2017-10-09 MED ORDER — SODIUM CHLORIDE 0.9 % IJ SOLN
10.0000 mL | INTRAMUSCULAR | Status: DC | PRN
  Administered 2017-10-09: 10 mL
  Filled 2017-10-09: qty 10

## 2017-10-09 MED ORDER — LORAZEPAM 2 MG/ML IJ SOLN
INTRAMUSCULAR | Status: AC
  Filled 2017-10-09: qty 1

## 2017-10-09 MED ORDER — HEPARIN SOD (PORK) LOCK FLUSH 100 UNIT/ML IV SOLN
500.0000 [IU] | Freq: Once | INTRAVENOUS | Status: AC | PRN
  Administered 2017-10-09: 500 [IU]
  Filled 2017-10-09: qty 5

## 2017-10-09 MED ORDER — DIPHENHYDRAMINE HCL 25 MG PO CAPS
25.0000 mg | ORAL_CAPSULE | Freq: Once | ORAL | Status: AC
  Administered 2017-10-09: 25 mg via ORAL

## 2017-10-09 MED ORDER — ACETAMINOPHEN 325 MG PO TABS
650.0000 mg | ORAL_TABLET | Freq: Once | ORAL | Status: AC
  Administered 2017-10-09: 650 mg via ORAL

## 2017-10-09 NOTE — Patient Instructions (Signed)
Cancer Center Discharge Instructions for Patients Receiving Chemotherapy  Today you received the following chemotherapy agents Herceptin  To help prevent nausea and vomiting after your treatment, we encourage you to take your nausea medication as directed   If you develop nausea and vomiting that is not controlled by your nausea medication, call the clinic.   BELOW ARE SYMPTOMS THAT SHOULD BE REPORTED IMMEDIATELY:  *FEVER GREATER THAN 100.5 F  *CHILLS WITH OR WITHOUT FEVER  NAUSEA AND VOMITING THAT IS NOT CONTROLLED WITH YOUR NAUSEA MEDICATION  *UNUSUAL SHORTNESS OF BREATH  *UNUSUAL BRUISING OR BLEEDING  TENDERNESS IN MOUTH AND THROAT WITH OR WITHOUT PRESENCE OF ULCERS  *URINARY PROBLEMS  *BOWEL PROBLEMS  UNUSUAL RASH Items with * indicate a potential emergency and should be followed up as soon as possible.  Feel free to call the clinic should you have any questions or concerns. The clinic phone number is [phone].  Please show the CHEMO ALERT CARD at check-in to the Emergency Department and triage nurse.   

## 2017-10-09 NOTE — Telephone Encounter (Signed)
Gave avs and calendar for march - july °

## 2017-11-06 ENCOUNTER — Inpatient Hospital Stay: Payer: Medicare Other | Attending: Oncology

## 2017-11-06 ENCOUNTER — Inpatient Hospital Stay: Payer: Medicare Other

## 2017-11-06 ENCOUNTER — Other Ambulatory Visit: Payer: Self-pay | Admitting: *Deleted

## 2017-11-06 VITALS — BP 137/82 | HR 64 | Temp 98.2°F | Resp 18

## 2017-11-06 DIAGNOSIS — Z5112 Encounter for antineoplastic immunotherapy: Secondary | ICD-10-CM | POA: Insufficient documentation

## 2017-11-06 DIAGNOSIS — C50912 Malignant neoplasm of unspecified site of left female breast: Secondary | ICD-10-CM | POA: Insufficient documentation

## 2017-11-06 DIAGNOSIS — D689 Coagulation defect, unspecified: Secondary | ICD-10-CM | POA: Insufficient documentation

## 2017-11-06 DIAGNOSIS — C50919 Malignant neoplasm of unspecified site of unspecified female breast: Secondary | ICD-10-CM

## 2017-11-06 DIAGNOSIS — C7951 Secondary malignant neoplasm of bone: Secondary | ICD-10-CM | POA: Insufficient documentation

## 2017-11-06 DIAGNOSIS — C50911 Malignant neoplasm of unspecified site of right female breast: Secondary | ICD-10-CM

## 2017-11-06 LAB — COMPREHENSIVE METABOLIC PANEL
ALK PHOS: 75 U/L (ref 40–150)
ALT: 12 U/L (ref 0–55)
AST: 12 U/L (ref 5–34)
Albumin: 3.6 g/dL (ref 3.5–5.0)
Anion gap: 9 (ref 3–11)
BUN: 22 mg/dL (ref 7–26)
CALCIUM: 10 mg/dL (ref 8.4–10.4)
CHLORIDE: 105 mmol/L (ref 98–109)
CO2: 29 mmol/L (ref 22–29)
CREATININE: 1.01 mg/dL (ref 0.60–1.10)
GFR calc Af Amer: >60 mL/min (ref >60)
GFR, EST NON AFRICAN AMERICAN: 57 mL/min — AB (ref >60)
Glucose, Bld: 92 mg/dL (ref 70–140)
Potassium: 3.7 mmol/L (ref 3.5–5.1)
Sodium: 143 mmol/L (ref 136–145)
Total Bilirubin: 0.8 mg/dL (ref 0.2–1.2)
Total Protein: 8 g/dL (ref 6.4–8.3)

## 2017-11-06 LAB — CBC WITH DIFFERENTIAL/PLATELET
BASOS ABS: 0.1 10*3/uL (ref 0.0–0.1)
Basophils Relative: 1 %
EOS PCT: 2 %
Eosinophils Absolute: 0.2 10*3/uL (ref 0.0–0.5)
HCT: 37.3 % (ref 34.8–46.6)
HEMOGLOBIN: 12.6 g/dL (ref 11.6–15.9)
LYMPHS ABS: 2.1 10*3/uL (ref 0.9–3.3)
LYMPHS PCT: 25 %
MCH: 28.1 pg (ref 25.1–34.0)
MCHC: 33.9 g/dL (ref 31.5–36.0)
MCV: 83 fL (ref 79.5–101.0)
Monocytes Absolute: 0.7 10*3/uL (ref 0.1–0.9)
Monocytes Relative: 8 %
NEUTROS ABS: 5.4 10*3/uL (ref 1.5–6.5)
Neutrophils Relative %: 64 %
PLATELETS: 249 10*3/uL (ref 145–400)
RBC: 4.49 MIL/uL (ref 3.70–5.45)
RDW: 15.4 % — ABNORMAL HIGH (ref 11.2–14.5)
WBC: 8.4 10*3/uL (ref 3.9–10.3)

## 2017-11-06 LAB — PROTIME-INR
INR: 1.98
PROTHROMBIN TIME: 22.4 s — AB (ref 11.4–15.2)

## 2017-11-06 MED ORDER — TRASTUZUMAB CHEMO 150 MG IV SOLR
798.0000 mg | Freq: Once | INTRAVENOUS | Status: AC
  Administered 2017-11-06: 798 mg via INTRAVENOUS
  Filled 2017-11-06: qty 38

## 2017-11-06 MED ORDER — HEPARIN SOD (PORK) LOCK FLUSH 100 UNIT/ML IV SOLN
500.0000 [IU] | Freq: Once | INTRAVENOUS | Status: AC | PRN
  Administered 2017-11-06: 500 [IU]
  Filled 2017-11-06: qty 5

## 2017-11-06 MED ORDER — SODIUM CHLORIDE 0.9 % IJ SOLN
10.0000 mL | INTRAMUSCULAR | Status: DC | PRN
  Administered 2017-11-06: 10 mL
  Filled 2017-11-06: qty 10

## 2017-11-06 MED ORDER — DIPHENHYDRAMINE HCL 25 MG PO CAPS
ORAL_CAPSULE | ORAL | Status: AC
  Filled 2017-11-06: qty 1

## 2017-11-06 MED ORDER — ALPRAZOLAM 1 MG PO TABS: 1.0000 mg | ORAL_TABLET | Freq: Three times a day (TID) | ORAL | 0 refills | Status: DC | PRN

## 2017-11-06 MED ORDER — SODIUM CHLORIDE 0.9 % IJ SOLN
10.0000 mL | INTRAMUSCULAR | Status: DC | PRN
  Filled 2017-11-06: qty 10

## 2017-11-06 MED ORDER — ACETAMINOPHEN 325 MG PO TABS
ORAL_TABLET | ORAL | Status: AC
  Filled 2017-11-06: qty 2

## 2017-11-06 MED ORDER — LORAZEPAM 2 MG/ML IJ SOLN
INTRAMUSCULAR | Status: AC
  Filled 2017-11-06: qty 1

## 2017-11-06 MED ORDER — DIPHENHYDRAMINE HCL 25 MG PO CAPS
25.0000 mg | ORAL_CAPSULE | Freq: Once | ORAL | Status: AC
  Administered 2017-11-06: 25 mg via ORAL

## 2017-11-06 MED ORDER — ACETAMINOPHEN 325 MG PO TABS
650.0000 mg | ORAL_TABLET | Freq: Once | ORAL | Status: AC
  Administered 2017-11-06: 650 mg via ORAL

## 2017-11-06 MED ORDER — LORAZEPAM 2 MG/ML IJ SOLN
1.0000 mg | Freq: Once | INTRAMUSCULAR | Status: AC | PRN
  Administered 2017-11-06: 1 mg via INTRAVENOUS

## 2017-11-06 MED ORDER — SODIUM CHLORIDE 0.9 % IV SOLN
Freq: Once | INTRAVENOUS | Status: AC
  Administered 2017-11-06: 10:00:00 via INTRAVENOUS

## 2017-11-06 MED ORDER — TEMAZEPAM 30 MG PO CAPS: ORAL_CAPSULE | ORAL | 2 refills | Status: DC

## 2017-11-06 NOTE — Patient Instructions (Signed)
[facility] Discharge Instructions for Patients Receiving Chemotherapy  Today you received the following chemotherapy agents Herceptin  To help prevent nausea and vomiting after your treatment, we encourage you to take your nausea medication as directed   If you develop nausea and vomiting that is not controlled by your nausea medication, call the clinic.   BELOW ARE SYMPTOMS THAT SHOULD BE REPORTED IMMEDIATELY:  *FEVER GREATER THAN 100.5 F  *CHILLS WITH OR WITHOUT FEVER  NAUSEA AND VOMITING THAT IS NOT CONTROLLED WITH YOUR NAUSEA MEDICATION  *UNUSUAL SHORTNESS OF BREATH  *UNUSUAL BRUISING OR BLEEDING  TENDERNESS IN MOUTH AND THROAT WITH OR WITHOUT PRESENCE OF ULCERS  *URINARY PROBLEMS  *BOWEL PROBLEMS  UNUSUAL RASH Items with * indicate a potential emergency and should be followed up as soon as possible.  Feel free to call the clinic should you have any questions or concerns. The clinic phone number is [phone].  Please show the CHEMO ALERT CARD at check-in to the Emergency Department and triage nurse.   

## 2017-11-28 DIAGNOSIS — M170 Bilateral primary osteoarthritis of knee: Secondary | ICD-10-CM | POA: Diagnosis not present

## 2017-12-04 ENCOUNTER — Inpatient Hospital Stay: Payer: Medicare Other | Attending: Oncology

## 2017-12-04 ENCOUNTER — Other Ambulatory Visit: Payer: Medicare Other

## 2017-12-04 ENCOUNTER — Inpatient Hospital Stay: Payer: Medicare Other

## 2017-12-04 VITALS — BP 130/71 | HR 63 | Temp 98.6°F | Resp 16

## 2017-12-04 DIAGNOSIS — C50912 Malignant neoplasm of unspecified site of left female breast: Secondary | ICD-10-CM | POA: Insufficient documentation

## 2017-12-04 DIAGNOSIS — C7951 Secondary malignant neoplasm of bone: Secondary | ICD-10-CM | POA: Diagnosis not present

## 2017-12-04 DIAGNOSIS — Z5112 Encounter for antineoplastic immunotherapy: Secondary | ICD-10-CM | POA: Insufficient documentation

## 2017-12-04 DIAGNOSIS — C50919 Malignant neoplasm of unspecified site of unspecified female breast: Secondary | ICD-10-CM

## 2017-12-04 DIAGNOSIS — C50911 Malignant neoplasm of unspecified site of right female breast: Secondary | ICD-10-CM

## 2017-12-04 LAB — COMPREHENSIVE METABOLIC PANEL
ALK PHOS: 71 U/L (ref 40–150)
ALT: 13 U/L (ref 0–55)
ANION GAP: 10 (ref 3–11)
AST: 8 U/L (ref 5–34)
Albumin: 3.5 g/dL (ref 3.5–5.0)
BILIRUBIN TOTAL: 0.5 mg/dL (ref 0.2–1.2)
BUN: 31 mg/dL — ABNORMAL HIGH (ref 7–26)
CALCIUM: 9.9 mg/dL (ref 8.4–10.4)
CO2: 27 mmol/L (ref 22–29)
Chloride: 106 mmol/L (ref 98–109)
Creatinine, Ser: 1.04 mg/dL (ref 0.60–1.10)
GFR calc non Af Amer: 55 mL/min — ABNORMAL LOW (ref >60)
Glucose, Bld: 104 mg/dL (ref 70–140)
Potassium: 4.5 mmol/L (ref 3.5–5.1)
Sodium: 143 mmol/L (ref 136–145)
TOTAL PROTEIN: 8.1 g/dL (ref 6.4–8.3)

## 2017-12-04 LAB — CBC WITH DIFFERENTIAL/PLATELET
Basophils Absolute: 0.1 10*3/uL (ref 0.0–0.1)
Basophils Relative: 1 %
Eosinophils Absolute: 0 10*3/uL (ref 0.0–0.5)
Eosinophils Relative: 0 %
HEMATOCRIT: 36.7 % (ref 34.8–46.6)
HEMOGLOBIN: 12.4 g/dL (ref 11.6–15.9)
LYMPHS ABS: 1.9 10*3/uL (ref 0.9–3.3)
Lymphocytes Relative: 17 %
MCH: 28.4 pg (ref 25.1–34.0)
MCHC: 33.8 g/dL (ref 31.5–36.0)
MCV: 83.8 fL (ref 79.5–101.0)
MONO ABS: 0.8 10*3/uL (ref 0.1–0.9)
MONOS PCT: 7 %
NEUTROS PCT: 75 %
Neutro Abs: 8.6 10*3/uL — ABNORMAL HIGH (ref 1.5–6.5)
Platelets: 261 10*3/uL (ref 145–400)
RBC: 4.38 MIL/uL (ref 3.70–5.45)
RDW: 16 % — ABNORMAL HIGH (ref 11.2–14.5)
WBC: 11.5 10*3/uL — ABNORMAL HIGH (ref 3.9–10.3)

## 2017-12-04 LAB — PROTIME-INR
INR: 1.43
Prothrombin Time: 17.3 seconds — ABNORMAL HIGH (ref 11.4–15.2)

## 2017-12-04 MED ORDER — ACETAMINOPHEN 325 MG PO TABS
650.0000 mg | ORAL_TABLET | Freq: Once | ORAL | Status: AC
  Administered 2017-12-04: 650 mg via ORAL

## 2017-12-04 MED ORDER — DIPHENHYDRAMINE HCL 25 MG PO CAPS
ORAL_CAPSULE | ORAL | Status: AC
  Filled 2017-12-04: qty 1

## 2017-12-04 MED ORDER — LORAZEPAM 2 MG/ML IJ SOLN
1.0000 mg | Freq: Once | INTRAMUSCULAR | Status: AC | PRN
  Administered 2017-12-04: 1 mg via INTRAVENOUS

## 2017-12-04 MED ORDER — SODIUM CHLORIDE 0.9 % IJ SOLN
10.0000 mL | INTRAMUSCULAR | Status: DC | PRN
  Administered 2017-12-04: 10 mL
  Filled 2017-12-04: qty 10

## 2017-12-04 MED ORDER — SODIUM CHLORIDE 0.9 % IV SOLN
Freq: Once | INTRAVENOUS | Status: AC
  Administered 2017-12-04: 08:00:00 via INTRAVENOUS

## 2017-12-04 MED ORDER — DIPHENHYDRAMINE HCL 25 MG PO CAPS
25.0000 mg | ORAL_CAPSULE | Freq: Once | ORAL | Status: AC
  Administered 2017-12-04: 25 mg via ORAL

## 2017-12-04 MED ORDER — LORAZEPAM 2 MG/ML IJ SOLN
INTRAMUSCULAR | Status: AC
  Filled 2017-12-04: qty 1

## 2017-12-04 MED ORDER — ACETAMINOPHEN 325 MG PO TABS
ORAL_TABLET | ORAL | Status: AC
  Filled 2017-12-04: qty 2

## 2017-12-04 MED ORDER — TRASTUZUMAB CHEMO 150 MG IV SOLR
798.0000 mg | Freq: Once | INTRAVENOUS | Status: AC
  Administered 2017-12-04: 798 mg via INTRAVENOUS
  Filled 2017-12-04: qty 38

## 2017-12-04 MED ORDER — HEPARIN SOD (PORK) LOCK FLUSH 100 UNIT/ML IV SOLN
500.0000 [IU] | Freq: Once | INTRAVENOUS | Status: AC | PRN
  Administered 2017-12-04: 500 [IU]
  Filled 2017-12-04: qty 5

## 2017-12-04 NOTE — Patient Instructions (Signed)
[facility] Discharge Instructions for Patients Receiving Chemotherapy  Today you received the following chemotherapy agents Herceptin  To help prevent nausea and vomiting after your treatment, we encourage you to take your nausea medication as directed   If you develop nausea and vomiting that is not controlled by your nausea medication, call the clinic.   BELOW ARE SYMPTOMS THAT SHOULD BE REPORTED IMMEDIATELY:  *FEVER GREATER THAN 100.5 F  *CHILLS WITH OR WITHOUT FEVER  NAUSEA AND VOMITING THAT IS NOT CONTROLLED WITH YOUR NAUSEA MEDICATION  *UNUSUAL SHORTNESS OF BREATH  *UNUSUAL BRUISING OR BLEEDING  TENDERNESS IN MOUTH AND THROAT WITH OR WITHOUT PRESENCE OF ULCERS  *URINARY PROBLEMS  *BOWEL PROBLEMS  UNUSUAL RASH Items with * indicate a potential emergency and should be followed up as soon as possible.  Feel free to call the clinic should you have any questions or concerns. The clinic phone number is [phone].  Please show the CHEMO ALERT CARD at check-in to the Emergency Department and triage nurse.   

## 2017-12-12 ENCOUNTER — Other Ambulatory Visit: Payer: Self-pay | Admitting: *Deleted

## 2017-12-12 MED ORDER — ALPRAZOLAM 1 MG PO TABS: 1.0000 mg | ORAL_TABLET | Freq: Three times a day (TID) | ORAL | 0 refills | Status: DC | PRN

## 2017-12-16 ENCOUNTER — Telehealth (HOSPITAL_COMMUNITY): Payer: Self-pay | Admitting: Vascular Surgery

## 2017-12-16 NOTE — Telephone Encounter (Signed)
Pt message to make brst f/u w/ [facility] w/ echo in July

## 2017-12-19 ENCOUNTER — Telehealth (HOSPITAL_COMMUNITY): Payer: Self-pay | Admitting: Vascular Surgery

## 2017-12-19 NOTE — Telephone Encounter (Signed)
Left pt message to make brst echo and f/u w/ [facility]

## 2017-12-27 NOTE — Progress Notes (Addendum)
[place]  [phone] [phone]    ID: RIOT BARRICK   DOB: 02/05/1951  MR#: [id number]  GYF#:[id number]   Patient Care Team: Ernestene Kiel, MD as PCP - General (Internal Medicine) Haze Rushing., MD as Consulting Physician (Pain Medicine) Joya Salm, MD as Referring Physician (Orthopedic Surgery) Jaymes Graff, DO as Consulting Physician (Orthopedic Surgery) Larey Dresser, MD as Consulting Physician (Cardiology) Bensimhon, Shaune Pascal, MD as Consulting Physician (Cardiology) Lorelle Gibbs, MD (Radiology) Tommy Medal, Lavell Islam, MD as Consulting Physician (Infectious Diseases) Gery Pray, MD as Consulting Physician (Radiation Oncology) Artavius Stearns, Virgie Dad, MD as Consulting Physician (Oncology)   CC: Metastatic HER-2 positive, estrogen receptor negative breast cancer  CURRENT TREATMENT: Trastuzumab every 28 days; warfarin   INTERVAL HISTORY: Lavanya returns today for a follow-up and treatment of her estrogen receptor negative, but HER-2 amplified breast cancer accompanied by her husband.    And she is getting she continues on trastuzumab, which she tolerates well.   Most recent echocardiogram was 09/24/2017 with an ejection fraction in the 55-60%.  She denies any bleeding problems associated with the warfarin.  REVIEW OF SYSTEMS: Lyle is doing well, overall. She continues to experience arthritis, muscle spasms, occasional shooting pains at surgical sites, headaches and fluid build. Nikitha will take baclofen as needed with relief for her muscle spasms. She is also aware of when she has excess fluid build up because she will also experience SOB. Occasional blurry eyes, but adds she has cataracts.  She denies nausea, vomiting, or dizziness. There has been no unusual cough, phlegm production, or pleurisy. This been no change in bowel or bladder habits. She denies unexplained fatigue or unexplained weight loss, bleeding,  rash, or fever. A detailed review of systems was otherwise noncontributory.    BREAST CANCER HISTORY:   From the earlier summary:  Jenilee Franey is 67 years old [name] female.  This woman has been in good health all of her life.  She noted a swelling and discomfort in her right breast in June 2004.  She was seen in the Emergency Room in [name] and was treated for mastitis.  She was treated for a number of months with mastitis and the swelling did not get better. She was given hydrocodone and Cipro.  Finally, the swelling did get better and ultimately the nipple became retracted and she noticed some dimpling in her skin. She had a mammogram in July of 2004 in [place] with subsequent mammogram on May 27, 2003, by Dr. Isaiah Blakes.  Mammogram done on September 30 showed marked increased density in the left breast.  Biopsy was performed the same day.  It was noted at the 12 o'clock position, deep in the breast was a focal hypoechoic mass, at least 3.5 cm in diameter.  Biopsy did in fact show invasive in situ mammary carcinoma. This was felt to be both at least intermediate, high grade.  No definite lymphovascular invasion was identified. ER and PR negative, Her2 testing positive. Derrisha continues to have pain in her breast.  She continues to take hydrocodone a number of times a day.  She has been seen by Dr. Rosana Hoes, who felt that neoadjuvant chemotherapy would be required.    Initial staging studies showed evidence of liver and lung mets.   Patient also has evidence of bone lesions. Patient started neoadjuvant chemotherapy, Taxotere/Carbo/Herceptin in October 2004.   Patient had a CT scan in December 2004 which demonstrated extensive clot in the SVC innominate vein,  bilateral jugular vein and  She was started on anticoagulation therapy. She received a total of 6 cycles of Taxotere/Carbo/Herceptin, completed in April 2005."  Patient has been on  trastuzumab continued indefinitely; has also received  lapatinib and capecitabine for variable intervals in 2007-2008. Most recent echo 12/01/2013 showed an ejection fraction of 55%. She is status post bilateral mastectomies with bilateral axillary lymph node dissection 12/07/2004, showing (a) on the right, a mypT1c ypN1 invasive ductal carcinoma, grade 3, estrogen and progesterone receptor negative, HER-2 positive, with an MIB-1 of 31% (b) on the left, ypT2 ypN1 invasive ductal carcinoma, grade 2, estrogen and progesterone receptor negative, HER-2 positive, with an MIB-1 of 35%. She is Status post radiation June through July of 2006, to the right chest wall, left chest wall, bilateral supraclavicular fossae, and bilateral axillary boosts; with additional radiation to the right and left chest walls and the central chest wall completed November of 2007. She is status post ixempra x9 completed August of 2009. 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 Patient has chronic pain, with negative PET scan 08/24/2013 (no evidence of active cancer). On Neurontin and Tramadol therapy.  Her subsequent history is as detailed below  PAST MEDICAL HISTORY: Past Medical History:  Diagnosis Date  . Breast cancer ([place])    mets to liver and lung  . Breast cancer metastasized to multiple sites ([place]) 02/26/2013  . History of chemotherapy Feb. 2006   taxotere/herceptin/carboplatin  . Hypertension   . Neuropathy   . Radiation 07/31/2006   left upper chest  . Radiation 06/17/2006-06/27/2006   6480 cGy bilat. chest wall  . SVC syndrome   . Thrombosis     PAST SURGICAL HISTORY: Past Surgical History:  Procedure Laterality Date  . ANKLE SURGERY    . BACK SURGERY    . CHOLECYSTECTOMY  1989  . MASTECTOMY Bilateral   . PERIPHERALLY INSERTED CENTRAL CATHETER INSERTION    . TUBAL LIGATION  1986    FAMILY HISTORY Family History  Problem Relation Age of Onset  . Heart failure Father     . Cancer Father        Prostate cancer  . Heart failure Brother   . Cancer Brother        Prostate cancer  . Diabetes Maternal Aunt    She had three brothers, one died of gunshot wound, one of complications of diabetes mellitus and one of myocardial infarction.  She has no sisters.  Mother died of complications of brain metastasis in 47.  Father has had a myocardial infarction in 1999.  No history of breast or ovarian cancer in the family.    GYNECOLOGIC HISTORY:   Menarche at age 6.  Gravida 3, para 3.  First live birth at age 77.  No history of breast feeding. No history of hormonal replacement therapy.   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 Her husband used to work as a Art gallery manager, but is now retired. She has three children, Monette who lives in [place] and works as a Hydrographic surveyor, Financial risk analyst who lives in [place] and works as a Administrator, and [place] who lives in [place] and also works as a Hydrographic surveyor. The patient has 12 grandchildren and 4 great-grandchildren. She attends a [employer]. Very involved with school kids.  ADVANCED DIRECTIVES:  Not in place  HEALTH MAINTENANCE: (  Updated 06/12/2013) Social History   Tobacco Use  . Smoking status: Never Smoker  . Smokeless tobacco: Never Used  Substance Use Topics  . Alcohol use: Yes    Comment: occasional  . Drug use: No     Colonoscopy: Never and "I don't want one"  PAP:  1987  Bone density:  Never  Lipid panel:  Not on file    Allergies  Allergen Reactions  . Penicillins Hives    PATIENT HAS TOLERATED CEPHALOSPORINGS  . Adhesive [Tape] Other (See Comments)    Tears skin     Current Outpatient Medications  Medication Sig Dispense Refill  . acetaminophen (TYLENOL) 500 MG tablet Take 1,000 mg by mouth every 6 (six) hours as needed for mild pain or fever.     Marland Kitchen albuterol (PROVENTIL HFA;VENTOLIN HFA) 108 (90 BASE) MCG/ACT inhaler  Inhale 2 puffs into the lungs every 6 (six) hours as needed for wheezing. 1 Inhaler 5  . ALPRAZolam (XANAX) 1 MG tablet Take 1 tablet (1 mg total) by mouth 3 (three) times daily as needed. for anxiety 60 tablet 0  . amLODipine (NORVASC) 10 MG tablet Take 10 mg by mouth every morning.    . baclofen (LIORESAL) 10 MG tablet Take 1 tablet (10 mg total) by mouth 3 (three) times daily as needed. for muscle spams 270 tablet 3  . carvedilol (COREG) 6.25 MG tablet Take 1 tablet (6.25 mg total) by mouth 2 (two) times daily. 60 tablet 3  . diclofenac sodium (VOLTAREN) 1 % GEL Apply 2 g topically daily as needed (for pain). Apply to knees and shoulders 100 g 6  . furosemide (LASIX) 40 MG tablet Take 1 tablet (40 mg total) by mouth daily. 30 tablet 3  . gabapentin (NEURONTIN) 300 MG capsule TAKE 2 CAPSULES(600 MG) BY MOUTH THREE TIMES DAILY 540 capsule 0  . losartan (COZAAR) 100 MG tablet Take 100 mg by mouth every morning.    . potassium chloride (K-DUR,KLOR-CON) 20 MEQ tablet Take 1 tablet (20 mEq total) by mouth daily. 30 tablet 3  . spironolactone (ALDACTONE) 25 MG tablet Take 1 tablet (25 mg total) by mouth daily. NEEDS OFFICE VISIT 30 tablet 3  . temazepam (RESTORIL) 30 MG capsule TAKE ONE CAPSULE BY MOUTH EVERY NIGHT AT BEDTIME AS NEEDED FOR SLEEP 30 capsule 2  . warfarin (COUMADIN) 5 MG tablet Take 1 tablet (5 mg total) by mouth daily at 12 noon. 90 tablet 4   No current facility-administered medications for this visit.    Facility-Administered Medications Ordered in Other Visits  Medication Dose Route Frequency Provider Last Rate Last Dose  . sodium chloride flush (NS) 0.9 % injection 10 mL  10 mL Intravenous PRN Journe Hallmark, Virgie Dad, MD   10 mL at 12/15/15 1200    OBJECTIVE: Morbidly obese African-American woman who appears well  Vitals:   01/01/18 0813  BP: (!) 139/93  Pulse: 64  Resp: 18  Temp: 98.1 F (36.7 C)  SpO2: 96%     Body mass index is 50.06 kg/m.    ECOG FS: 1 Filed Weights    01/01/18 0813  Weight: 282 lb 9.6 oz (128.2 kg)   Sclerae unicteric, pupils round and equal Oropharynx clear and moist No cervical or supraclavicular adenopathy Lungs no rales or rhonchi Heart regular rate and rhythm Abd soft, nontender, positive bowel sounds MSK no focal spinal tenderness, no upper extremity lymphedema Neuro: nonfocal, well oriented, appropriate affect Breasts: Status post bilateral mastectomies, with no evidence of chest  wall recurrence.  Both axillae are benign.  LAB RESULTS: Lab Results  Component Value Date   WBC 9.0 01/01/2018   NEUTROABS 5.7 01/01/2018   HGB 12.4 01/01/2018   HCT 36.5 01/01/2018   MCV 85.1 01/01/2018   PLT 204 01/01/2018      Chemistry      Component Value Date/Time   NA 143 12/04/2017 0739   NA 143 08/14/2017 0811   K 4.5 12/04/2017 0739   K 3.8 08/14/2017 0811   CL 106 12/04/2017 0739   CL 104 01/30/2013 0850   CO2 27 12/04/2017 0739   CO2 24 08/14/2017 0811   BUN 31 (H) 12/04/2017 0739   BUN 14.9 08/14/2017 0811   CREATININE 1.04 12/04/2017 0739   CREATININE 0.9 08/14/2017 0811      Component Value Date/Time   CALCIUM 9.9 12/04/2017 0739   CALCIUM 9.4 08/14/2017 0811   ALKPHOS 71 12/04/2017 0739   ALKPHOS 73 08/14/2017 0811   AST 8 12/04/2017 0739   AST 10 08/14/2017 0811   ALT 13 12/04/2017 0739   ALT 10 08/14/2017 0811   BILITOT 0.5 12/04/2017 0739   BILITOT 0.62 08/14/2017 0811     STUDIES: Most recent scan obtained August 2018  ASSESSMENT: 67 y.o.  [place], [place], woman  (1)  with a history of inflammatory right breast cancer metastatic at presentation September 2004 with involvement of liver and bone, HER-2 positive, estrogen and progesterone receptor negative  (2) treated with carboplatin, docetaxel and Herceptin x 6 completed April 2005  (3) trastuzumab continued indefinitely;   (a) has also received lapatinib and capecitabine for variable intervals in 2007-2008.  (b) Every 6 month echo: Most  recent 09/24/2017 showed an ejection fraction in the 55-60%  (4) status post bilateral mastectomies with bilateral axillary lymph node dissection 12/07/2004, showing  (a) on the right, a mypT1c ypN1 invasive ductal carcinoma, grade 3, estrogen and progesterone receptor negative, HER-2 positive, with an MIB-1 of 31%  (b) on the left, ypT2 ypN1 invasive ductal carcinoma, grade 2, estrogen and progesterone receptor negative, HER-2 positive, with an MIB-1 of 35%.  (5)  Status post radiation June through July of 2006, to the right chest wall, left chest wall, bilateral supraclavicular fossae, and bilateral axillary boosts; with additional radiation to the right and left chest walls and the central chest wall completed November of 2007  (6) status post Ixempra x9 completed August of 2009.  (7) history of superior vena caval syndrome, on life long anticoagulation   (8)  History of chemotherapy-induced neuropathy.   (9)  chronic pain, with negative PET scan 08/24/2013 (no evidence of active cancer). On Neurontin and Tramadol  (a) repeat PET scan December 2016 again negative.  (b) Repeat PET scan 04/23/2017 shows no active malignancy  (10) right upper extremity cellulitis, no bacteremia; treated with cephalexin /doxycycline for 2 weeks, with resolution  (11 subacute fracture right humerus, nondisplaced, status post fall 03/27/2017   PLAN: Angeleah is now 15 years out from initial diagnosis of metastatic breast cancer, with no evidence of active disease.  This is very favorable.  She continues on monthly trastuzumab.  She tolerates this well.  She will be due for repeat echocardiogram in June.  Her anticoagulation is very stable and today I refilled her warfarin.  We will continue to check the INR in a monthly basis  She will see me again in 3 months.  Before the end of this year she will have a repeat PET scan.  She knows to call for any other issues that may develop before the next  visit.   Bassem Bernasconi, Virgie Dad, MD  01/01/18 8:47 AM Medical Oncology and Hematology [facility] [address] [place], [place] [address] Tel. [phone]    Fax. [phone]  This document serves as a record of services personally performed by Chauncey Cruel, MD. It was created on his behalf by Margit Banda, a trained medical scribe. The creation of this record is based on the scribe's personal observations and the provider's statements to them.   I have reviewed the above documentation for accuracy and completeness, and I agree with the above.  ADDENDUM: INR is again low.  We are increasing the warfarin to 5 daily except Monday Wednesdays and Fridays she will take 7.5 mg.

## 2017-12-30 ENCOUNTER — Encounter (HOSPITAL_COMMUNITY): Payer: Self-pay | Admitting: Vascular Surgery

## 2018-01-01 ENCOUNTER — Telehealth: Payer: Self-pay | Admitting: Oncology

## 2018-01-01 ENCOUNTER — Inpatient Hospital Stay (HOSPITAL_BASED_OUTPATIENT_CLINIC_OR_DEPARTMENT_OTHER): Payer: Medicare Other | Admitting: Oncology

## 2018-01-01 ENCOUNTER — Inpatient Hospital Stay: Payer: Medicare Other | Attending: Oncology

## 2018-01-01 ENCOUNTER — Inpatient Hospital Stay: Payer: Medicare Other

## 2018-01-01 VITALS — BP 139/93 | HR 64 | Temp 98.1°F | Resp 18 | Ht 63.0 in | Wt 282.6 lb

## 2018-01-01 DIAGNOSIS — C50011 Malignant neoplasm of nipple and areola, right female breast: Secondary | ICD-10-CM | POA: Diagnosis not present

## 2018-01-01 DIAGNOSIS — C50812 Malignant neoplasm of overlapping sites of left female breast: Secondary | ICD-10-CM | POA: Insufficient documentation

## 2018-01-01 DIAGNOSIS — Z95828 Presence of other vascular implants and grafts: Secondary | ICD-10-CM

## 2018-01-01 DIAGNOSIS — C50911 Malignant neoplasm of unspecified site of right female breast: Secondary | ICD-10-CM | POA: Diagnosis not present

## 2018-01-01 DIAGNOSIS — Z7901 Long term (current) use of anticoagulants: Secondary | ICD-10-CM | POA: Insufficient documentation

## 2018-01-01 DIAGNOSIS — Z5112 Encounter for antineoplastic immunotherapy: Secondary | ICD-10-CM | POA: Diagnosis not present

## 2018-01-01 DIAGNOSIS — C7951 Secondary malignant neoplasm of bone: Secondary | ICD-10-CM | POA: Insufficient documentation

## 2018-01-01 DIAGNOSIS — C50012 Malignant neoplasm of nipple and areola, left female breast: Secondary | ICD-10-CM

## 2018-01-01 DIAGNOSIS — Z171 Estrogen receptor negative status [ER-]: Secondary | ICD-10-CM | POA: Diagnosis not present

## 2018-01-01 DIAGNOSIS — C50919 Malignant neoplasm of unspecified site of unspecified female breast: Secondary | ICD-10-CM

## 2018-01-01 DIAGNOSIS — C50912 Malignant neoplasm of unspecified site of left female breast: Secondary | ICD-10-CM

## 2018-01-01 LAB — CBC WITH DIFFERENTIAL/PLATELET
BASOS ABS: 0.1 10*3/uL (ref 0.0–0.1)
Basophils Relative: 1 %
Eosinophils Absolute: 0.1 10*3/uL (ref 0.0–0.5)
Eosinophils Relative: 1 %
HCT: 36.5 % (ref 34.8–46.6)
HEMOGLOBIN: 12.4 g/dL (ref 11.6–15.9)
Lymphocytes Relative: 26 %
Lymphs Abs: 2.3 10*3/uL (ref 0.9–3.3)
MCH: 28.9 pg (ref 25.1–34.0)
MCHC: 33.9 g/dL (ref 31.5–36.0)
MCV: 85.1 fL (ref 79.5–101.0)
MONOS PCT: 9 %
Monocytes Absolute: 0.8 10*3/uL (ref 0.1–0.9)
Neutro Abs: 5.7 10*3/uL (ref 1.5–6.5)
Neutrophils Relative %: 63 %
Platelets: 204 10*3/uL (ref 145–400)
RBC: 4.29 MIL/uL (ref 3.70–5.45)
RDW: 16.1 % — ABNORMAL HIGH (ref 11.2–14.5)
WBC: 9 10*3/uL (ref 3.9–10.3)

## 2018-01-01 LAB — COMPREHENSIVE METABOLIC PANEL
ALBUMIN: 3.6 g/dL (ref 3.5–5.0)
ALK PHOS: 74 U/L (ref 40–150)
ALT: 17 U/L (ref 0–55)
AST: 13 U/L (ref 5–34)
Anion gap: 9 (ref 3–11)
BUN: 39 mg/dL — ABNORMAL HIGH (ref 7–26)
CALCIUM: 9.8 mg/dL (ref 8.4–10.4)
CO2: 29 mmol/L (ref 22–29)
CREATININE: 1.23 mg/dL — AB (ref 0.60–1.10)
Chloride: 104 mmol/L (ref 98–109)
GFR calc Af Amer: 52 mL/min — ABNORMAL LOW (ref >60)
GFR calc non Af Amer: 45 mL/min — ABNORMAL LOW (ref >60)
GLUCOSE: 96 mg/dL (ref 70–140)
Potassium: 4 mmol/L (ref 3.5–5.1)
SODIUM: 142 mmol/L (ref 136–145)
Total Bilirubin: 0.7 mg/dL (ref 0.2–1.2)
Total Protein: 7.7 g/dL (ref 6.4–8.3)

## 2018-01-01 LAB — PROTIME-INR
INR: 1.38
Prothrombin Time: 16.8 seconds — ABNORMAL HIGH (ref 11.4–15.2)

## 2018-01-01 MED ORDER — ACETAMINOPHEN 325 MG PO TABS
ORAL_TABLET | ORAL | Status: AC
Start: 2018-01-01 — End: ?
  Filled 2018-01-01: qty 2

## 2018-01-01 MED ORDER — ACETAMINOPHEN 325 MG PO TABS
650.0000 mg | ORAL_TABLET | Freq: Once | ORAL | Status: AC
  Administered 2018-01-01: 650 mg via ORAL

## 2018-01-01 MED ORDER — TRASTUZUMAB CHEMO 150 MG IV SOLR
798.0000 mg | Freq: Once | INTRAVENOUS | Status: AC
  Administered 2018-01-01: 798 mg via INTRAVENOUS
  Filled 2018-01-01: qty 38

## 2018-01-01 MED ORDER — SODIUM CHLORIDE 0.9 % IV SOLN
Freq: Once | INTRAVENOUS | Status: AC
  Administered 2018-01-01: 10:00:00 via INTRAVENOUS

## 2018-01-01 MED ORDER — WARFARIN SODIUM 5 MG PO TABS: 5.0000 mg | ORAL_TABLET | Freq: Every day | ORAL | 4 refills | Status: DC

## 2018-01-01 MED ORDER — LORAZEPAM 2 MG/ML IJ SOLN
INTRAMUSCULAR | Status: AC
  Filled 2018-01-01: qty 1

## 2018-01-01 MED ORDER — DIPHENHYDRAMINE HCL 25 MG PO CAPS
ORAL_CAPSULE | ORAL | Status: AC
  Filled 2018-01-01: qty 1

## 2018-01-01 MED ORDER — SODIUM CHLORIDE 0.9 % IJ SOLN
10.0000 mL | INTRAMUSCULAR | Status: DC | PRN
  Administered 2018-01-01: 10 mL via INTRAVENOUS
  Filled 2018-01-01: qty 10

## 2018-01-01 MED ORDER — DIPHENHYDRAMINE HCL 25 MG PO CAPS
25.0000 mg | ORAL_CAPSULE | Freq: Once | ORAL | Status: AC
  Administered 2018-01-01: 25 mg via ORAL

## 2018-01-01 MED ORDER — TEMAZEPAM 30 MG PO CAPS: ORAL_CAPSULE | ORAL | 2 refills | Status: DC

## 2018-01-01 MED ORDER — HEPARIN SOD (PORK) LOCK FLUSH 100 UNIT/ML IV SOLN
500.0000 [IU] | Freq: Once | INTRAVENOUS | Status: AC | PRN
Start: 2018-01-01 — End: 2018-01-01
  Administered 2018-01-01: 500 [IU]
  Filled 2018-01-01: qty 5

## 2018-01-01 MED ORDER — SODIUM CHLORIDE 0.9 % IJ SOLN
10.0000 mL | INTRAMUSCULAR | Status: DC | PRN
  Administered 2018-01-01: 10 mL
  Filled 2018-01-01: qty 10

## 2018-01-01 MED ORDER — LORAZEPAM 2 MG/ML IJ SOLN
1.0000 mg | Freq: Once | INTRAMUSCULAR | Status: AC | PRN
  Administered 2018-01-01: 1 mg via INTRAVENOUS

## 2018-01-01 NOTE — Telephone Encounter (Signed)
Gave avs and calendar ° °

## 2018-01-01 NOTE — Patient Instructions (Signed)
[facility] Discharge Instructions for Patients Receiving Chemotherapy  Today you received the following chemotherapy agents Herceptin  To help prevent nausea and vomiting after your treatment, we encourage you to take your nausea medication as directed   If you develop nausea and vomiting that is not controlled by your nausea medication, call the clinic.   BELOW ARE SYMPTOMS THAT SHOULD BE REPORTED IMMEDIATELY:  *FEVER GREATER THAN 100.5 F  *CHILLS WITH OR WITHOUT FEVER  NAUSEA AND VOMITING THAT IS NOT CONTROLLED WITH YOUR NAUSEA MEDICATION  *UNUSUAL SHORTNESS OF BREATH  *UNUSUAL BRUISING OR BLEEDING  TENDERNESS IN MOUTH AND THROAT WITH OR WITHOUT PRESENCE OF ULCERS  *URINARY PROBLEMS  *BOWEL PROBLEMS  UNUSUAL RASH Items with * indicate a potential emergency and should be followed up as soon as possible.  Feel free to call the clinic should you have any questions or concerns. The clinic phone number is [phone].  Please show the CHEMO ALERT CARD at check-in to the Emergency Department and triage nurse.   

## 2018-01-01 NOTE — Progress Notes (Signed)
Per Dr Jana Hakim pt to increase warfarin to 7.5 mg on Mondays, Wednesdays and Fridays only. Message relayed to infusion RN Learta Codding.

## 2018-01-02 DIAGNOSIS — Z79891 Long term (current) use of opiate analgesic: Secondary | ICD-10-CM | POA: Diagnosis not present

## 2018-01-02 DIAGNOSIS — M170 Bilateral primary osteoarthritis of knee: Secondary | ICD-10-CM | POA: Diagnosis not present

## 2018-01-02 DIAGNOSIS — F419 Anxiety disorder, unspecified: Secondary | ICD-10-CM | POA: Diagnosis not present

## 2018-01-29 ENCOUNTER — Inpatient Hospital Stay: Payer: Medicare Other | Attending: Oncology

## 2018-01-29 ENCOUNTER — Inpatient Hospital Stay: Payer: Medicare Other

## 2018-01-29 ENCOUNTER — Other Ambulatory Visit: Payer: Self-pay | Admitting: *Deleted

## 2018-01-29 ENCOUNTER — Telehealth: Payer: Self-pay | Admitting: *Deleted

## 2018-01-29 DIAGNOSIS — Z7901 Long term (current) use of anticoagulants: Secondary | ICD-10-CM | POA: Diagnosis not present

## 2018-01-29 DIAGNOSIS — Z5112 Encounter for antineoplastic immunotherapy: Secondary | ICD-10-CM | POA: Insufficient documentation

## 2018-01-29 DIAGNOSIS — C50919 Malignant neoplasm of unspecified site of unspecified female breast: Secondary | ICD-10-CM

## 2018-01-29 DIAGNOSIS — C50912 Malignant neoplasm of unspecified site of left female breast: Secondary | ICD-10-CM

## 2018-01-29 DIAGNOSIS — C50812 Malignant neoplasm of overlapping sites of left female breast: Secondary | ICD-10-CM | POA: Insufficient documentation

## 2018-01-29 DIAGNOSIS — I871 Compression of vein: Secondary | ICD-10-CM | POA: Diagnosis not present

## 2018-01-29 DIAGNOSIS — C50911 Malignant neoplasm of unspecified site of right female breast: Secondary | ICD-10-CM

## 2018-01-29 DIAGNOSIS — Z95828 Presence of other vascular implants and grafts: Secondary | ICD-10-CM

## 2018-01-29 LAB — CBC WITH DIFFERENTIAL/PLATELET
Basophils Absolute: 0 10*3/uL (ref 0.0–0.1)
Basophils Relative: 0 %
Eosinophils Absolute: 0.1 10*3/uL (ref 0.0–0.5)
Eosinophils Relative: 1 %
HEMATOCRIT: 33.3 % — AB (ref 34.8–46.6)
HEMOGLOBIN: 11.3 g/dL — AB (ref 11.6–15.9)
LYMPHS ABS: 2.4 10*3/uL (ref 0.9–3.3)
LYMPHS PCT: 25 %
MCH: 28.5 pg (ref 25.1–34.0)
MCHC: 33.9 g/dL (ref 31.5–36.0)
MCV: 84.1 fL (ref 79.5–101.0)
Monocytes Absolute: 0.8 10*3/uL (ref 0.1–0.9)
Monocytes Relative: 8 %
NEUTROS PCT: 66 %
Neutro Abs: 6.2 10*3/uL (ref 1.5–6.5)
Platelets: 247 10*3/uL (ref 145–400)
RBC: 3.96 MIL/uL (ref 3.70–5.45)
RDW: 15.7 % — ABNORMAL HIGH (ref 11.2–14.5)
WBC: 9.5 10*3/uL (ref 3.9–10.3)

## 2018-01-29 LAB — COMPREHENSIVE METABOLIC PANEL
ALT: 11 U/L (ref 0–55)
AST: 13 U/L (ref 5–34)
Albumin: 3.4 g/dL — ABNORMAL LOW (ref 3.5–5.0)
Alkaline Phosphatase: 74 U/L (ref 40–150)
Anion gap: 10 (ref 3–11)
BILIRUBIN TOTAL: 0.8 mg/dL (ref 0.2–1.2)
BUN: 28 mg/dL — ABNORMAL HIGH (ref 7–26)
CALCIUM: 9.5 mg/dL (ref 8.4–10.4)
CO2: 27 mmol/L (ref 22–29)
Chloride: 105 mmol/L (ref 98–109)
Creatinine, Ser: 1.03 mg/dL (ref 0.60–1.10)
GFR, EST NON AFRICAN AMERICAN: 55 mL/min — AB (ref >60)
Glucose, Bld: 90 mg/dL (ref 70–140)
Potassium: 3.7 mmol/L (ref 3.5–5.1)
Sodium: 142 mmol/L (ref 136–145)
Total Protein: 7.4 g/dL (ref 6.4–8.3)

## 2018-01-29 LAB — PROTIME-INR
INR: 2.58
Prothrombin Time: 27.4 seconds — ABNORMAL HIGH (ref 11.4–15.2)

## 2018-01-29 MED ORDER — TRASTUZUMAB CHEMO 150 MG IV SOLR
798.0000 mg | Freq: Once | INTRAVENOUS | Status: AC
  Administered 2018-01-29: 798 mg via INTRAVENOUS
  Filled 2018-01-29: qty 38

## 2018-01-29 MED ORDER — HEPARIN SOD (PORK) LOCK FLUSH 100 UNIT/ML IV SOLN
500.0000 [IU] | Freq: Once | INTRAVENOUS | Status: AC | PRN
  Administered 2018-01-29: 500 [IU]
  Filled 2018-01-29: qty 5

## 2018-01-29 MED ORDER — LORAZEPAM 2 MG/ML IJ SOLN
INTRAMUSCULAR | Status: AC
  Filled 2018-01-29: qty 1

## 2018-01-29 MED ORDER — SODIUM CHLORIDE 0.9 % IV SOLN
Freq: Once | INTRAVENOUS | Status: AC
  Administered 2018-01-29: 09:00:00 via INTRAVENOUS

## 2018-01-29 MED ORDER — ACETAMINOPHEN 325 MG PO TABS
650.0000 mg | ORAL_TABLET | Freq: Once | ORAL | Status: AC
  Administered 2018-01-29: 650 mg via ORAL

## 2018-01-29 MED ORDER — WARFARIN SODIUM 2.5 MG PO TABS: ORAL_TABLET | ORAL | 3 refills | Status: DC

## 2018-01-29 MED ORDER — DIPHENHYDRAMINE HCL 25 MG PO CAPS
ORAL_CAPSULE | ORAL | Status: AC
Start: 2018-01-29 — End: ?
  Filled 2018-01-29: qty 1

## 2018-01-29 MED ORDER — SODIUM CHLORIDE 0.9 % IJ SOLN
10.0000 mL | INTRAMUSCULAR | Status: DC | PRN
  Administered 2018-01-29: 10 mL
  Filled 2018-01-29: qty 10

## 2018-01-29 MED ORDER — DIPHENHYDRAMINE HCL 25 MG PO CAPS
25.0000 mg | ORAL_CAPSULE | Freq: Once | ORAL | Status: AC
  Administered 2018-01-29: 25 mg via ORAL

## 2018-01-29 MED ORDER — SODIUM CHLORIDE 0.9 % IJ SOLN
10.0000 mL | INTRAMUSCULAR | Status: DC | PRN
  Filled 2018-01-29: qty 10

## 2018-01-29 MED ORDER — ACETAMINOPHEN 325 MG PO TABS
ORAL_TABLET | ORAL | Status: AC
  Filled 2018-01-29: qty 2

## 2018-01-29 MED ORDER — WARFARIN SODIUM 5 MG PO TABS: 5.0000 mg | ORAL_TABLET | Freq: Every day | ORAL | 4 refills | Status: DC

## 2018-01-29 MED ORDER — LORAZEPAM 2 MG/ML IJ SOLN
1.0000 mg | Freq: Once | INTRAMUSCULAR | Status: AC | PRN
  Administered 2018-01-29: 1 mg via INTRAVENOUS

## 2018-01-29 NOTE — Patient Instructions (Signed)
[facility] Discharge Instructions for Patients Receiving Chemotherapy  Today you received the following chemotherapy agents Herceptin.  To help prevent nausea and vomiting after your treatment, we encourage you to take your nausea medication as scheduled.   If you develop nausea and vomiting that is not controlled by your nausea medication, call the clinic.   BELOW ARE SYMPTOMS THAT SHOULD BE REPORTED IMMEDIATELY:  *FEVER GREATER THAN 100.5 F  *CHILLS WITH OR WITHOUT FEVER  NAUSEA AND VOMITING THAT IS NOT CONTROLLED WITH YOUR NAUSEA MEDICATION  *UNUSUAL SHORTNESS OF BREATH  *UNUSUAL BRUISING OR BLEEDING  TENDERNESS IN MOUTH AND THROAT WITH OR WITHOUT PRESENCE OF ULCERS  *URINARY PROBLEMS  *BOWEL PROBLEMS  UNUSUAL RASH Items with * indicate a potential emergency and should be followed up as soon as possible.  Feel free to call the clinic should you have any questions or concerns. The clinic phone number is [phone].  Please show the [facility] at check-in to the Emergency Department and triage nurse.

## 2018-02-03 ENCOUNTER — Other Ambulatory Visit: Payer: Self-pay | Admitting: *Deleted

## 2018-02-03 ENCOUNTER — Other Ambulatory Visit: Payer: Self-pay | Admitting: Oncology

## 2018-02-03 MED ORDER — ALPRAZOLAM 1 MG PO TABS: 1.0000 mg | ORAL_TABLET | Freq: Three times a day (TID) | ORAL | 0 refills | Status: DC | PRN

## 2018-02-03 MED ORDER — ALPRAZOLAM 1 MG PO TABS: 1.0000 mg | ORAL_TABLET | Freq: Three times a day (TID) | ORAL | 1 refills | Status: DC | PRN

## 2018-02-04 ENCOUNTER — Other Ambulatory Visit: Payer: Self-pay | Admitting: Oncology

## 2018-02-26 ENCOUNTER — Inpatient Hospital Stay: Payer: Medicare Other

## 2018-02-26 ENCOUNTER — Other Ambulatory Visit: Payer: Self-pay

## 2018-02-26 ENCOUNTER — Inpatient Hospital Stay: Payer: Medicare Other | Attending: Oncology

## 2018-02-26 ENCOUNTER — Telehealth: Payer: Self-pay

## 2018-02-26 VITALS — BP 123/87 | HR 64 | Temp 98.4°F | Resp 18

## 2018-02-26 DIAGNOSIS — C50912 Malignant neoplasm of unspecified site of left female breast: Secondary | ICD-10-CM

## 2018-02-26 DIAGNOSIS — Z95828 Presence of other vascular implants and grafts: Secondary | ICD-10-CM

## 2018-02-26 DIAGNOSIS — C50812 Malignant neoplasm of overlapping sites of left female breast: Secondary | ICD-10-CM | POA: Insufficient documentation

## 2018-02-26 DIAGNOSIS — Z5112 Encounter for antineoplastic immunotherapy: Secondary | ICD-10-CM | POA: Insufficient documentation

## 2018-02-26 DIAGNOSIS — C50919 Malignant neoplasm of unspecified site of unspecified female breast: Secondary | ICD-10-CM

## 2018-02-26 DIAGNOSIS — D689 Coagulation defect, unspecified: Secondary | ICD-10-CM | POA: Insufficient documentation

## 2018-02-26 DIAGNOSIS — C50911 Malignant neoplasm of unspecified site of right female breast: Secondary | ICD-10-CM

## 2018-02-26 LAB — COMPREHENSIVE METABOLIC PANEL
ALT: 12 U/L (ref 0–44)
AST: 10 U/L — AB (ref 15–41)
Albumin: 3.8 g/dL (ref 3.5–5.0)
Alkaline Phosphatase: 97 U/L (ref 38–126)
Anion gap: 8 (ref 5–15)
BILIRUBIN TOTAL: 0.6 mg/dL (ref 0.3–1.2)
BUN: 26 mg/dL — AB (ref 8–23)
CO2: 30 mmol/L (ref 22–32)
Calcium: 10 mg/dL (ref 8.9–10.3)
Chloride: 105 mmol/L (ref 98–111)
Creatinine, Ser: 1.11 mg/dL — ABNORMAL HIGH (ref 0.44–1.00)
GFR calc non Af Amer: 51 mL/min — ABNORMAL LOW (ref >60)
GFR, EST AFRICAN AMERICAN: 59 mL/min — AB (ref >60)
Glucose, Bld: 97 mg/dL (ref 70–99)
POTASSIUM: 3.9 mmol/L (ref 3.5–5.1)
Sodium: 143 mmol/L (ref 135–145)
Total Protein: 8.1 g/dL (ref 6.5–8.1)

## 2018-02-26 LAB — CBC WITH DIFFERENTIAL/PLATELET
BASOS PCT: 1 %
Basophils Absolute: 0.1 10*3/uL (ref 0.0–0.1)
Eosinophils Absolute: 0.2 10*3/uL (ref 0.0–0.5)
Eosinophils Relative: 2 %
HEMATOCRIT: 37.7 % (ref 34.8–46.6)
HEMOGLOBIN: 12.9 g/dL (ref 11.6–15.9)
LYMPHS ABS: 2.5 10*3/uL (ref 0.9–3.3)
Lymphocytes Relative: 26 %
MCH: 29 pg (ref 25.1–34.0)
MCHC: 34.1 g/dL (ref 31.5–36.0)
MCV: 84.9 fL (ref 79.5–101.0)
MONO ABS: 0.7 10*3/uL (ref 0.1–0.9)
MONOS PCT: 8 %
NEUTROS ABS: 6 10*3/uL (ref 1.5–6.5)
Neutrophils Relative %: 63 %
Platelets: 266 10*3/uL (ref 145–400)
RBC: 4.44 MIL/uL (ref 3.70–5.45)
RDW: 14.8 % — AB (ref 11.2–14.5)
WBC: 9.5 10*3/uL (ref 3.9–10.3)

## 2018-02-26 LAB — PROTIME-INR
INR: 4.76
PROTHROMBIN TIME: 44.4 s — AB (ref 11.4–15.2)

## 2018-02-26 MED ORDER — ACETAMINOPHEN 325 MG PO TABS
ORAL_TABLET | ORAL | Status: AC
  Filled 2018-02-26: qty 2

## 2018-02-26 MED ORDER — ALPRAZOLAM 1 MG PO TABS: 1.0000 mg | ORAL_TABLET | Freq: Three times a day (TID) | ORAL | 0 refills | Status: DC | PRN

## 2018-02-26 MED ORDER — DIPHENHYDRAMINE HCL 25 MG PO CAPS
ORAL_CAPSULE | ORAL | Status: AC
  Filled 2018-02-26: qty 1

## 2018-02-26 MED ORDER — TRASTUZUMAB CHEMO 150 MG IV SOLR
798.0000 mg | Freq: Once | INTRAVENOUS | Status: AC
  Administered 2018-02-26: 798 mg via INTRAVENOUS
  Filled 2018-02-26: qty 38

## 2018-02-26 MED ORDER — SODIUM CHLORIDE 0.9 % IJ SOLN
10.0000 mL | INTRAMUSCULAR | Status: DC | PRN
Start: 2018-02-26 — End: 2018-02-26
  Administered 2018-02-26: 10 mL
  Filled 2018-02-26: qty 10

## 2018-02-26 MED ORDER — LORAZEPAM 2 MG/ML IJ SOLN
1.0000 mg | Freq: Once | INTRAMUSCULAR | Status: AC | PRN
  Administered 2018-02-26: 1 mg via INTRAVENOUS

## 2018-02-26 MED ORDER — SODIUM CHLORIDE 0.9 % IJ SOLN
10.0000 mL | INTRAMUSCULAR | Status: DC | PRN
  Administered 2018-02-26: 10 mL via INTRAVENOUS
  Filled 2018-02-26: qty 10

## 2018-02-26 MED ORDER — SODIUM CHLORIDE 0.9 % IV SOLN
Freq: Once | INTRAVENOUS | Status: AC
  Administered 2018-02-26: 09:00:00 via INTRAVENOUS

## 2018-02-26 MED ORDER — HEPARIN SOD (PORK) LOCK FLUSH 100 UNIT/ML IV SOLN
500.0000 [IU] | Freq: Once | INTRAVENOUS | Status: AC | PRN
Start: 2018-02-26 — End: 2018-02-26
  Administered 2018-02-26: 500 [IU]
  Filled 2018-02-26: qty 5

## 2018-02-26 MED ORDER — ACETAMINOPHEN 325 MG PO TABS
650.0000 mg | ORAL_TABLET | Freq: Once | ORAL | Status: AC
  Administered 2018-02-26: 650 mg via ORAL

## 2018-02-26 MED ORDER — DIPHENHYDRAMINE HCL 25 MG PO CAPS
25.0000 mg | ORAL_CAPSULE | Freq: Once | ORAL | Status: AC
  Administered 2018-02-26: 25 mg via ORAL

## 2018-02-26 MED ORDER — LORAZEPAM 2 MG/ML IJ SOLN
INTRAMUSCULAR | Status: AC
  Filled 2018-02-26: qty 1

## 2018-02-26 NOTE — Patient Instructions (Signed)
Cancer Center Discharge Instructions for Patients Receiving Chemotherapy  Today you received the following chemotherapy agents Herceptin  To help prevent nausea and vomiting after your treatment, we encourage you to take your nausea medication as directed   If you develop nausea and vomiting that is not controlled by your nausea medication, call the clinic.   BELOW ARE SYMPTOMS THAT SHOULD BE REPORTED IMMEDIATELY:  *FEVER GREATER THAN 100.5 F  *CHILLS WITH OR WITHOUT FEVER  NAUSEA AND VOMITING THAT IS NOT CONTROLLED WITH YOUR NAUSEA MEDICATION  *UNUSUAL SHORTNESS OF BREATH  *UNUSUAL BRUISING OR BLEEDING  TENDERNESS IN MOUTH AND THROAT WITH OR WITHOUT PRESENCE OF ULCERS  *URINARY PROBLEMS  *BOWEL PROBLEMS  UNUSUAL RASH Items with * indicate a potential emergency and should be followed up as soon as possible.  Feel free to call the clinic should you have any questions or concerns. The clinic phone number is [phone].  Please show the CHEMO ALERT CARD at check-in to the Emergency Department and triage nurse.   

## 2018-02-26 NOTE — Progress Notes (Unsigned)
Critical INR of 4.76 called to and read back by Rogue Jury, RN. [place] Slater, [place]

## 2018-02-26 NOTE — Telephone Encounter (Signed)
Spoke with pt by phone regarding INR of 4.76. Pt states that she takes 7.5mg  on M,W,F and 5mg  on T,Tr,Sat,Sun. Per Dr Jana Hakim pt should start taking 5mg  every day. Pt verbalizes understanding of instructions.

## 2018-03-01 ENCOUNTER — Other Ambulatory Visit: Payer: Self-pay | Admitting: Oncology

## 2018-03-03 DIAGNOSIS — M170 Bilateral primary osteoarthritis of knee: Secondary | ICD-10-CM | POA: Diagnosis not present

## 2018-03-04 NOTE — Telephone Encounter (Signed)
No entry 

## 2018-03-07 ENCOUNTER — Ambulatory Visit (HOSPITAL_BASED_OUTPATIENT_CLINIC_OR_DEPARTMENT_OTHER)
Admission: RE | Admit: 2018-03-07 | Discharge: 2018-03-07 | Disposition: A | Discharge status: Home / Self Care | Payer: Medicare Other | Source: Ambulatory Visit | Attending: Cardiology | Admitting: Cardiology

## 2018-03-07 ENCOUNTER — Ambulatory Visit (HOSPITAL_COMMUNITY)
Admission: RE | Admit: 2018-03-07 | Discharge: 2018-03-07 | Disposition: A | Discharge status: Home / Self Care | Payer: Medicare Other | Source: Ambulatory Visit | Attending: Internal Medicine | Admitting: Internal Medicine

## 2018-03-07 VITALS — BP 153/83 | HR 55 | Wt 286.4 lb

## 2018-03-07 DIAGNOSIS — Z9221 Personal history of antineoplastic chemotherapy: Secondary | ICD-10-CM | POA: Diagnosis not present

## 2018-03-07 DIAGNOSIS — Z9013 Acquired absence of bilateral breasts and nipples: Secondary | ICD-10-CM | POA: Diagnosis not present

## 2018-03-07 DIAGNOSIS — C787 Secondary malignant neoplasm of liver and intrahepatic bile duct: Secondary | ICD-10-CM | POA: Insufficient documentation

## 2018-03-07 DIAGNOSIS — Z853 Personal history of malignant neoplasm of breast: Secondary | ICD-10-CM | POA: Diagnosis not present

## 2018-03-07 DIAGNOSIS — C7800 Secondary malignant neoplasm of unspecified lung: Secondary | ICD-10-CM | POA: Diagnosis not present

## 2018-03-07 DIAGNOSIS — I110 Hypertensive heart disease with heart failure: Secondary | ICD-10-CM | POA: Diagnosis not present

## 2018-03-07 DIAGNOSIS — Z1501 Genetic susceptibility to malignant neoplasm of breast: Secondary | ICD-10-CM | POA: Diagnosis not present

## 2018-03-07 DIAGNOSIS — C50919 Malignant neoplasm of unspecified site of unspecified female breast: Secondary | ICD-10-CM

## 2018-03-07 DIAGNOSIS — Z923 Personal history of irradiation: Secondary | ICD-10-CM | POA: Insufficient documentation

## 2018-03-07 DIAGNOSIS — G629 Polyneuropathy, unspecified: Secondary | ICD-10-CM | POA: Insufficient documentation

## 2018-03-07 DIAGNOSIS — I5032 Chronic diastolic (congestive) heart failure: Secondary | ICD-10-CM | POA: Diagnosis not present

## 2018-03-07 DIAGNOSIS — Z7901 Long term (current) use of anticoagulants: Secondary | ICD-10-CM | POA: Diagnosis not present

## 2018-03-07 NOTE — Progress Notes (Signed)
Echocardiogram 2D Echocardiogram has been performed.  [name] 03/07/2018, 11:53 AM

## 2018-03-07 NOTE — Patient Instructions (Signed)
Take Furosemide 80 mg TODAY ONLY then back to 40 mg daily  We will contact you in 6 months to schedule your next appointment echocardiogram

## 2018-03-09 NOTE — Progress Notes (Signed)
Advanced Heart Failure Clinic Note   Patient ID: [name], female   DOB: 1950/12/08, 67 y.o.   MRN: [id number] Oncologist: Dr Jana Hakim  HPI: [name] is a 67 y.o. with a history of metastatic HER-2 positive breast carcinoma originally diagnosed September 2004. Started in R breast. Underwent neo-adjuvant chemo. During this time developed L breast CA. Underwent bilateral mastectomy. Lymph nodes +. 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   She received a total of 6 cycles of Taxotere/Carbo/Herceptin, completed in April 2005, after which she began single agent Herceptin, given every 4 weeks now. Plan to continue on Herceptin indefinitely.   She returns for cardio-oncology followup.  BP high today but usually normal when checked.  Stable symptoms, short of breath after walking 100 feet. No orthopnea/PND.  No chest pain. Weight down 1 lb.  Labs 11/15: K 4, creatinine 1.0   Labs 1/16: K 4.1, creatinine 1.0 Labs 8/16: K 4.3, creatinine 0.9 Labs 11/16: K 3.9, creatinine 0.9 Labs 5/17: K 3.9, creatinine 0.68 Labs 5/18: K 4, creatinine 0.65 Labs 1/19: K 4.1, creatinine 1.23 Labs 7/19: K 3.9, creatinine 1.11  04/23/12 ECHO EF 60-65% Lateral s' 8.9 cm/s  07/30/12 ECHO EF 60-65% Lateral s' 8.3 cm/s  09/11/12 ECHO EF 60-65% Lateral s' 10.3 cm/s  12/22/12 ECHO 55-60% Lateral S' 9.8 RV mildly dilated  07/01/13 ECHO 55-60%, lateral s' 9.79, mild RV dilation, grade II DD 3/15 ECHO 55%, mild MR, lateral s' 9.6, GLS -19.2% 03/02/14 ECHO EF 55-60% Lateral S' 9.4 GLS - 21.9  11/15 ECHO EF 60-65%, lateral S' 6.8, GLS -17.2%, mild RV dilation with normal systolic function, mild MR.  2/16 ECHO EF 60-65%, lateral S' 14.4, GLS -06.2%, normal diastolic function, mild RV dilation with normal RV systolic function.  8/16 ECHO EF 60-65%, mild LVH, normal RV size and systolic function, lateral s' 13.2, GLS -17%.  11/16 ECHO EF 60-65% Lateral S' 10.2, GLS -20.2% 5/17  ECHO EF 60-65%, mild LVH, lateral s' 12.2, GLS -20.8%, grade II diastolic dysfunction, normal RV. 9/17 ECHO EF 37-62%, normal diastolic function, GLS -83.1% 12/17 ECHO EF 55-60%, moderate diastolic dysfunction, GLS -20.1%, normal RV size and systolic function 5/17 ECHO EF 55-60%, GLS -61.6%, normal diastolic function, normal RV size and systolic function.  1/19 ECHO EF 55-60%, GLS -07.3%, normal diastolic function, RV normal size and systolic function.  7/19 ECHO EF 60-65%, GLS -22.1%.    ROS: All systems negative except as listed in HPI, PMH and Problem List.  Past Medical History:  Diagnosis Date  . Breast cancer ([place])    mets to liver and lung  . Breast cancer metastasized to multiple sites ([place]) 02/26/2013  . History of chemotherapy Feb. 2006   taxotere/herceptin/carboplatin  . Hypertension   . Neuropathy   . Radiation 07/31/2006   left upper chest  . Radiation 06/17/2006-06/27/2006   6480 cGy bilat. chest wall  . SVC syndrome   . Thrombosis     Current Outpatient Medications  Medication Sig Dispense Refill  . acetaminophen (TYLENOL) 500 MG tablet Take 1,000 mg by mouth every 6 (six) hours as needed for mild pain or fever.     Marland Kitchen albuterol (PROVENTIL HFA;VENTOLIN HFA) 108 (90 BASE) MCG/ACT inhaler Inhale 2 puffs into the lungs every 6 (six) hours as needed for wheezing. 1 Inhaler 5  . ALPRAZolam (XANAX) 1 MG tablet Take 1 tablet (1 mg total) by mouth 3 (three) times daily as needed. for anxiety 60 tablet  0  . amLODipine (NORVASC) 10 MG tablet Take 10 mg by mouth every morning.    . baclofen (LIORESAL) 10 MG tablet Take 1 tablet (10 mg total) by mouth 3 (three) times daily as needed. for muscle spams 270 tablet 3  . carvedilol (COREG) 6.25 MG tablet Take 1 tablet (6.25 mg total) by mouth 2 (two) times daily. 60 tablet 3  . diclofenac sodium (VOLTAREN) 1 % GEL Apply 2 g topically daily as needed (for pain). Apply to knees and shoulders 100 g 6  . furosemide (LASIX) 40 MG tablet Take 1  tablet (40 mg total) by mouth daily. 30 tablet 3  . gabapentin (NEURONTIN) 300 MG capsule TAKE 2 CAPSULES(600 MG) BY MOUTH THREE TIMES DAILY 540 capsule 0  . losartan (COZAAR) 100 MG tablet Take 100 mg by mouth every morning.    . potassium chloride (K-DUR,KLOR-CON) 20 MEQ tablet Take 1 tablet (20 mEq total) by mouth daily. 30 tablet 3  . spironolactone (ALDACTONE) 25 MG tablet Take 1 tablet (25 mg total) by mouth daily. NEEDS OFFICE VISIT 30 tablet 3  . temazepam (RESTORIL) 30 MG capsule TAKE ONE CAPSULE BY MOUTH EVERY NIGHT AT BEDTIME AS NEEDED FOR SLEEP 30 capsule 2  . warfarin (COUMADIN) 2.5 MG tablet Use with 5 mg as directed by doctor for chronic anticoagulation and monitoring of INR 30 tablet 3  . warfarin (COUMADIN) 5 MG tablet Take 1 tablet (5 mg total) by mouth daily at 12 noon. 90 tablet 4   No current facility-administered medications for this encounter.    Facility-Administered Medications Ordered in Other Encounters  Medication Dose Route Frequency Provider Last Rate Last Dose  . sodium chloride flush (NS) 0.9 % injection 10 mL  10 mL Intravenous PRN Magrinat, Virgie Dad, MD   10 mL at 12/15/15 1200    Vitals:   03/07/18 1201  BP: (!) 153/83  Pulse: (!) 55  SpO2: 98%  Weight: 286 lb 6.4 oz (129.9 kg)    PHYSICAL EXAM: General: NAD Neck: JVP 8 cm, no thyromegaly or thyroid nodule.  Lungs: Clear to auscultation bilaterally with normal respiratory effort. CV: Nondisplaced PMI.  Heart regular S1/S2, no S3/S4, no murmur.  1+ edema 1/2 to knees bilaterally.  No carotid bruit.  Normal pedal pulses.  Abdomen: Soft, nontender, no hepatosplenomegaly, no distention.  Skin: Intact without lesions or rashes.  Neurologic: Alert and oriented x 3.  Psych: Normal affect. Extremities: No clubbing or cyanosis.  HEENT: Normal.   ASSESSMENT & PLAN: 1) L Breast Cancer s/p bilateral mastectomies:  Symptomatically stable. Echo reviewed today, EF and strain stable.  She will be continuing  Herceptin indefinitely, now getting every 4 wks.   - Repeat echo in 6 months.   2) Suspected OSA: She has not wanted to do a sleep study.    3) HTN: BP high today but has been controlled at other checks.  No change to regimen.   4) Chronic diastolic CHF: Very mild volume overload, skipped Lasix for a few days. NYHA class II-III symptoms (though obesity/deconditioning likely plays a role).  - Take Lasix 80 mg daily x 2 days then decrease back to baseline 40 mg daily.   Followup 6 months with echo.   Loralie Champagne 03/09/2018

## 2018-03-26 ENCOUNTER — Inpatient Hospital Stay: Payer: Medicare Other

## 2018-03-26 ENCOUNTER — Other Ambulatory Visit: Payer: Self-pay

## 2018-03-26 VITALS — BP 132/77 | HR 67 | Temp 99.0°F | Resp 18

## 2018-03-26 DIAGNOSIS — C50919 Malignant neoplasm of unspecified site of unspecified female breast: Secondary | ICD-10-CM

## 2018-03-26 DIAGNOSIS — D689 Coagulation defect, unspecified: Secondary | ICD-10-CM | POA: Diagnosis not present

## 2018-03-26 DIAGNOSIS — Z95828 Presence of other vascular implants and grafts: Secondary | ICD-10-CM

## 2018-03-26 DIAGNOSIS — C50812 Malignant neoplasm of overlapping sites of left female breast: Secondary | ICD-10-CM | POA: Diagnosis not present

## 2018-03-26 DIAGNOSIS — C50912 Malignant neoplasm of unspecified site of left female breast: Secondary | ICD-10-CM

## 2018-03-26 DIAGNOSIS — Z5112 Encounter for antineoplastic immunotherapy: Secondary | ICD-10-CM | POA: Diagnosis not present

## 2018-03-26 DIAGNOSIS — C50911 Malignant neoplasm of unspecified site of right female breast: Secondary | ICD-10-CM

## 2018-03-26 LAB — CBC WITH DIFFERENTIAL/PLATELET
Basophils Absolute: 0 10*3/uL (ref 0.0–0.1)
Basophils Relative: 0 %
Eosinophils Absolute: 0.1 10*3/uL (ref 0.0–0.5)
Eosinophils Relative: 1 %
HCT: 34.5 % — ABNORMAL LOW (ref 34.8–46.6)
Hemoglobin: 11.9 g/dL (ref 11.6–15.9)
Lymphocytes Relative: 21 %
Lymphs Abs: 1.6 10*3/uL (ref 0.9–3.3)
MCH: 29 pg (ref 25.1–34.0)
MCHC: 34.5 g/dL (ref 31.5–36.0)
MCV: 83.9 fL (ref 79.5–101.0)
Monocytes Absolute: 0.6 10*3/uL (ref 0.1–0.9)
Monocytes Relative: 9 %
NEUTROS ABS: 5 10*3/uL (ref 1.5–6.5)
Neutrophils Relative %: 69 %
Platelets: 226 10*3/uL (ref 145–400)
RBC: 4.11 MIL/uL (ref 3.70–5.45)
RDW: 15.2 % — ABNORMAL HIGH (ref 11.2–14.5)
WBC: 7.3 10*3/uL (ref 3.9–10.3)

## 2018-03-26 LAB — COMPREHENSIVE METABOLIC PANEL
ALT: 16 U/L (ref 0–44)
ANION GAP: 9 (ref 5–15)
AST: 10 U/L — ABNORMAL LOW (ref 15–41)
Albumin: 3.7 g/dL (ref 3.5–5.0)
Alkaline Phosphatase: 65 U/L (ref 38–126)
BUN: 19 mg/dL (ref 8–23)
CALCIUM: 9.6 mg/dL (ref 8.9–10.3)
CO2: 24 mmol/L (ref 22–32)
Chloride: 107 mmol/L (ref 98–111)
Creatinine, Ser: 0.92 mg/dL (ref 0.44–1.00)
GFR calc non Af Amer: >60 mL/min (ref >60)
Glucose, Bld: 107 mg/dL — ABNORMAL HIGH (ref 70–99)
Potassium: 4 mmol/L (ref 3.5–5.1)
Sodium: 140 mmol/L (ref 135–145)
Total Bilirubin: 0.8 mg/dL (ref 0.3–1.2)
Total Protein: 7.5 g/dL (ref 6.5–8.1)

## 2018-03-26 LAB — PROTIME-INR
INR: 2.94
PROTHROMBIN TIME: 30.4 s — AB (ref 11.4–15.2)

## 2018-03-26 MED ORDER — LORAZEPAM 2 MG/ML IJ SOLN
INTRAMUSCULAR | Status: AC
  Filled 2018-03-26: qty 1

## 2018-03-26 MED ORDER — SODIUM CHLORIDE 0.9 % IJ SOLN
10.0000 mL | INTRAMUSCULAR | Status: DC | PRN
  Administered 2018-03-26: 10 mL via INTRAVENOUS
  Filled 2018-03-26: qty 10

## 2018-03-26 MED ORDER — DIPHENHYDRAMINE HCL 25 MG PO CAPS
25.0000 mg | ORAL_CAPSULE | Freq: Once | ORAL | Status: AC
  Administered 2018-03-26: 25 mg via ORAL

## 2018-03-26 MED ORDER — HEPARIN SOD (PORK) LOCK FLUSH 100 UNIT/ML IV SOLN
500.0000 [IU] | Freq: Once | INTRAVENOUS | Status: DC | PRN
  Filled 2018-03-26: qty 5

## 2018-03-26 MED ORDER — LORAZEPAM 2 MG/ML IJ SOLN
1.0000 mg | Freq: Once | INTRAMUSCULAR | Status: AC | PRN
  Administered 2018-03-26: 1 mg via INTRAVENOUS

## 2018-03-26 MED ORDER — ALPRAZOLAM 1 MG PO TABS: 1.0000 mg | ORAL_TABLET | Freq: Three times a day (TID) | ORAL | 0 refills | Status: DC | PRN

## 2018-03-26 MED ORDER — SODIUM CHLORIDE 0.9 % IV SOLN
Freq: Once | INTRAVENOUS | Status: AC
  Administered 2018-03-26: 09:00:00 via INTRAVENOUS
  Filled 2018-03-26: qty 250

## 2018-03-26 MED ORDER — SODIUM CHLORIDE 0.9 % IJ SOLN
10.0000 mL | INTRAMUSCULAR | Status: DC | PRN
  Filled 2018-03-26: qty 10

## 2018-03-26 MED ORDER — ACETAMINOPHEN 325 MG PO TABS
ORAL_TABLET | ORAL | Status: AC
  Filled 2018-03-26: qty 2

## 2018-03-26 MED ORDER — TRASTUZUMAB CHEMO 150 MG IV SOLR
798.0000 mg | Freq: Once | INTRAVENOUS | Status: AC
  Administered 2018-03-26: 798 mg via INTRAVENOUS
  Filled 2018-03-26: qty 38

## 2018-03-26 MED ORDER — DIPHENHYDRAMINE HCL 25 MG PO CAPS
ORAL_CAPSULE | ORAL | Status: AC
  Filled 2018-03-26: qty 1

## 2018-03-26 MED ORDER — ACETAMINOPHEN 325 MG PO TABS
650.0000 mg | ORAL_TABLET | Freq: Once | ORAL | Status: AC
  Administered 2018-03-26: 650 mg via ORAL

## 2018-03-26 MED ORDER — TEMAZEPAM 30 MG PO CAPS: ORAL_CAPSULE | ORAL | 2 refills | Status: DC

## 2018-03-26 NOTE — Patient Instructions (Signed)
[facility] Discharge Instructions for Patients Receiving Chemotherapy  Today you received the following chemotherapy agents Herceptin  To help prevent nausea and vomiting after your treatment, we encourage you to take your nausea medication as directed   If you develop nausea and vomiting that is not controlled by your nausea medication, call the clinic.   BELOW ARE SYMPTOMS THAT SHOULD BE REPORTED IMMEDIATELY:  *FEVER GREATER THAN 100.5 F  *CHILLS WITH OR WITHOUT FEVER  NAUSEA AND VOMITING THAT IS NOT CONTROLLED WITH YOUR NAUSEA MEDICATION  *UNUSUAL SHORTNESS OF BREATH  *UNUSUAL BRUISING OR BLEEDING  TENDERNESS IN MOUTH AND THROAT WITH OR WITHOUT PRESENCE OF ULCERS  *URINARY PROBLEMS  *BOWEL PROBLEMS  UNUSUAL RASH Items with * indicate a potential emergency and should be followed up as soon as possible.  Feel free to call the clinic should you have any questions or concerns. The clinic phone number is [phone].  Please show the CHEMO ALERT CARD at check-in to the Emergency Department and triage nurse.   

## 2018-04-03 DIAGNOSIS — C50912 Malignant neoplasm of unspecified site of left female breast: Secondary | ICD-10-CM | POA: Diagnosis not present

## 2018-04-03 DIAGNOSIS — C50911 Malignant neoplasm of unspecified site of right female breast: Secondary | ICD-10-CM | POA: Diagnosis not present

## 2018-04-03 DIAGNOSIS — M170 Bilateral primary osteoarthritis of knee: Secondary | ICD-10-CM | POA: Diagnosis not present

## 2018-04-03 DIAGNOSIS — I10 Essential (primary) hypertension: Secondary | ICD-10-CM | POA: Diagnosis not present

## 2018-04-22 NOTE — Progress Notes (Signed)
[facility]  [phone] [phone]    ID: [name]   DOB: [birth year]  MR#: [id number]  HQP#:[id number]   Patient Care Team: Ernestene Kiel, MD as PCP - General (Internal Medicine) Haze Rushing., MD as Consulting Physician (Pain Medicine) Joya Salm, MD as Referring Physician (Orthopedic Surgery) Jaymes Graff, DO as Consulting Physician (Orthopedic Surgery) Larey Dresser, MD as Consulting Physician (Cardiology) Bensimhon, Shaune Pascal, MD as Consulting Physician (Cardiology) Lorelle Gibbs, MD (Radiology) Tommy Medal, Lavell Islam, MD as Consulting Physician (Infectious Diseases) Gery Pray, MD as Consulting Physician (Radiation Oncology) Elyan Vanwieren, Virgie Dad, MD as Consulting Physician (Oncology)   CC: Metastatic HER-2 positive, estrogen receptor negative breast cancer  CURRENT TREATMENT: Trastuzumab every 28 days; lifelong warfarin   INTERVAL HISTORY: Nera returns today for a follow-up and treatment of her estrogen receptor negative, but HER-2 amplified breast cancer. She receives trastuzumab every 4 weeks, and she tolerates this well. She denies issues with diarrhea or nausea.   Her most recent echocardiogram on 03/07/2018 showed an ejection fraction in the 55-60% range.  She also has a history of superior vena cava syndrome and is on lifelong anticoagulation with Coumadin, which she has tolerated well. She denies issues with bleeding. INR this morning is pending.    REVIEW OF SYSTEMS: Cresencia reports that her husband recently had to have diabetes related leg amputation. He also has other various medical issues which has caused him anxiety. She used to walk with her husband for exercise. She dnies unusual headaches, visual changes, nausea, vomiting, or dizziness. There has been no unusual cough, phlegm production, or pleurisy. There has been no change in bowel or bladder habits. She denies unexplained fatigue or  unexplained weight loss, rash, or fever. A detailed review of systems was otherwise stable.     BREAST CANCER HISTORY:   From the earlier summary:  [name] is 67 years old [name] female.  This woman has been in good health all of her life.  She noted a swelling and discomfort in her right breast in June 2004.  She was seen in the Emergency Room in [place] and was treated for mastitis.  She was treated for a number of months with mastitis and the swelling did not get better. She was given hydrocodone and Cipro.  Finally, the swelling did get better and ultimately the nipple became retracted and she noticed some dimpling in her skin. She had a mammogram in July of 2004 in [place] with subsequent mammogram on May 27, 2003, by Dr. Isaiah Blakes.  Mammogram done on September 30 showed marked increased density in the left breast.  Biopsy was performed the same day.  It was noted at the 12 o'clock position, deep in the breast was a focal hypoechoic mass, at least 3.5 cm in diameter.  Biopsy did in fact show invasive in situ mammary carcinoma. This was felt to be both at least intermediate, high grade.  No definite lymphovascular invasion was identified. ER and PR negative, Her2 testing positive. Ramla continues to have pain in her breast.  She continues to take hydrocodone a number of times a day.  She has been seen by Dr. Rosana Hoes, who felt that neoadjuvant chemotherapy would be required.    Initial staging studies showed evidence of liver and lung mets.   Patient also has evidence of bone lesions. Patient started neoadjuvant chemotherapy, Taxotere/Carbo/Herceptin in October 2004.   Patient had a CT scan in December 2004 which demonstrated extensive clot  in the SVC innominate vein, bilateral jugular vein and  She was started on anticoagulation therapy. She received a total of 6 cycles of Taxotere/Carbo/Herceptin, completed in April 2005."  Patient has been on  trastuzumab continued indefinitely; has  also received lapatinib and capecitabine for variable intervals in 2007-2008. Most recent echo 12/01/2013 showed an ejection fraction of 55%. She is status post bilateral mastectomies with bilateral axillary lymph node dissection 12/07/2004, showing (a) on the right, a mypT1c ypN1 invasive ductal carcinoma, grade 3, estrogen and progesterone receptor negative, HER-2 positive, with an MIB-1 of 31% (b) on the left, ypT2 ypN1 invasive ductal carcinoma, grade 2, estrogen and progesterone receptor negative, HER-2 positive, with an MIB-1 of 35%. She is Status post radiation June through July of 2006, to the right chest wall, left chest wall, bilateral supraclavicular fossae, and bilateral axillary boosts; with additional radiation to the right and left chest walls and the central chest wall completed November of 2007. She is status post ixempra x9 completed August of 2009. 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 Patient has chronic pain, with negative PET scan 08/24/2013 (no evidence of active cancer). On Neurontin and Tramadol therapy.  Her subsequent history is as detailed below  PAST MEDICAL HISTORY: Past Medical History:  Diagnosis Date  . Breast cancer ([place])    mets to liver and lung  . Breast cancer metastasized to multiple sites ([place]) 02/26/2013  . History of chemotherapy Feb. 2006   taxotere/herceptin/carboplatin  . Hypertension   . Neuropathy   . Radiation 07/31/2006   left upper chest  . Radiation 06/17/2006-06/27/2006   6480 cGy bilat. chest wall  . SVC syndrome   . Thrombosis     PAST SURGICAL HISTORY: Past Surgical History:  Procedure Laterality Date  . ANKLE SURGERY    . BACK SURGERY    . CHOLECYSTECTOMY  1989  . MASTECTOMY Bilateral   . PERIPHERALLY INSERTED CENTRAL CATHETER INSERTION    . TUBAL LIGATION  1986    FAMILY HISTORY Family History  Problem Relation Age of Onset  . Heart  failure Father   . Cancer Father        Prostate cancer  . Heart failure Brother   . Cancer Brother        Prostate cancer  . Diabetes Maternal Aunt    She had three brothers, one died of gunshot wound, one of complications of diabetes mellitus and one of myocardial infarction.  She has no sisters.  Mother died of complications of brain metastasis in 9.  Father has had a myocardial infarction in 1999.  No history of breast or ovarian cancer in the family.    GYNECOLOGIC HISTORY:   Menarche at age 47.  Gravida 3, para 3.  First live birth at age 65.  No history of breast feeding. No history of hormonal replacement therapy.   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 He used to work as a Art gallery manager, but is now retired and disabled. The patient has three children, Monette who lives in [place] and works as a Hydrographic surveyor, Financial risk analyst who lives in [place] and works as a Administrator, and [place] who lives in [place] and also works as a Hydrographic surveyor. The patient has 14 grandchildren and 4 great-grandchildren. She attends a [employer]  [employer]. Very involved with school kids.  ADVANCED DIRECTIVES:  Not in place  HEALTH MAINTENANCE: (Updated 06/12/2013) Social History   Tobacco Use  . Smoking status: Never Smoker  . Smokeless tobacco: Never Used  Substance Use Topics  . Alcohol use: Yes    Comment: occasional  . Drug use: No     Colonoscopy: Never and "I don't want one"  PAP:  1987  Bone density:  Never  Lipid panel:  Not on file    Allergies  Allergen Reactions  . Penicillins Hives    PATIENT HAS TOLERATED CEPHALOSPORINGS  . Adhesive [Tape] Other (See Comments)    Tears skin     Current Outpatient Medications  Medication Sig Dispense Refill  . acetaminophen (TYLENOL) 500 MG tablet Take 1,000 mg by mouth every 6 (six) hours as  needed for mild pain or fever.     Marland Kitchen albuterol (PROVENTIL HFA;VENTOLIN HFA) 108 (90 BASE) MCG/ACT inhaler Inhale 2 puffs into the lungs every 6 (six) hours as needed for wheezing. 1 Inhaler 5  . ALPRAZolam (XANAX) 1 MG tablet Take 1 tablet (1 mg total) by mouth 3 (three) times daily as needed. for anxiety 60 tablet 0  . amLODipine (NORVASC) 10 MG tablet Take 10 mg by mouth every morning.    . baclofen (LIORESAL) 10 MG tablet Take 1 tablet (10 mg total) by mouth 3 (three) times daily as needed. for muscle spams 270 tablet 3  . carvedilol (COREG) 6.25 MG tablet Take 1 tablet (6.25 mg total) by mouth 2 (two) times daily. 60 tablet 3  . diclofenac sodium (VOLTAREN) 1 % GEL Apply 2 g topically daily as needed (for pain). Apply to knees and shoulders 100 g 6  . furosemide (LASIX) 40 MG tablet Take 1 tablet (40 mg total) by mouth daily. 30 tablet 3  . gabapentin (NEURONTIN) 300 MG capsule TAKE 2 CAPSULES(600 MG) BY MOUTH THREE TIMES DAILY 540 capsule 0  . losartan (COZAAR) 100 MG tablet Take 100 mg by mouth every morning.    . potassium chloride (K-DUR,KLOR-CON) 20 MEQ tablet Take 1 tablet (20 mEq total) by mouth daily. 30 tablet 3  . spironolactone (ALDACTONE) 25 MG tablet Take 1 tablet (25 mg total) by mouth daily. NEEDS OFFICE VISIT 30 tablet 3  . temazepam (RESTORIL) 30 MG capsule TAKE ONE CAPSULE BY MOUTH EVERY NIGHT AT BEDTIME AS NEEDED FOR SLEEP 30 capsule 2  . warfarin (COUMADIN) 2.5 MG tablet Use with 5 mg as directed by doctor for chronic anticoagulation and monitoring of INR 30 tablet 3  . warfarin (COUMADIN) 5 MG tablet Take 1 tablet (5 mg total) by mouth daily at 12 noon. 90 tablet 4   No current facility-administered medications for this visit.    Facility-Administered Medications Ordered in Other Visits  Medication Dose Route Frequency Provider Last Rate Last Dose  . sodium chloride flush (NS) 0.9 % injection 10 mL  10 mL Intravenous PRN Mazey Mantell, Virgie Dad, MD   10 mL at 12/15/15 1200     OBJECTIVE: Morbidly obese African-American woman in no acute distress  Vitals:   04/23/18 0829  BP: (!) 182/94  Pulse: 72  Resp: 16  Temp: 98.2 F (36.8 C)  SpO2: 98%     Body mass index is 49.69 kg/m.    ECOG FS: 1 Filed Weights   04/23/18 0829  Weight: 280 lb 8 oz (127.2 kg)   Sclerae unicteric, EOMs intact No cervical or supraclavicular adenopathy Lungs no rales or rhonchi  Heart regular rate and rhythm Abd soft, obese, nontender, positive bowel sounds MSK no focal spinal tenderness, no upper extremity lymphedema Neuro: nonfocal, well oriented, appropriate affect Breasts: That is post bilateral mastectomies.  There is no evidence of chest wall recurrence.  Both axillae are benign.  LAB RESULTS: Lab Results  Component Value Date   WBC 7.0 04/23/2018   NEUTROABS 4.7 04/23/2018   HGB 12.0 04/23/2018   HCT 35.0 04/23/2018   MCV 85.2 04/23/2018   PLT 235 04/23/2018      Chemistry      Component Value Date/Time   NA 145 04/23/2018 0811   NA 143 08/14/2017 0811   K 3.9 04/23/2018 0811   K 3.8 08/14/2017 0811   CL 107 04/23/2018 0811   CL 104 01/30/2013 0850   CO2 30 04/23/2018 0811   CO2 24 08/14/2017 0811   BUN 19 04/23/2018 0811   BUN 14.9 08/14/2017 0811   CREATININE 0.92 04/23/2018 0811   CREATININE 0.9 08/14/2017 0811      Component Value Date/Time   CALCIUM 9.9 04/23/2018 0811   CALCIUM 9.4 08/14/2017 0811   ALKPHOS 67 04/23/2018 0811   ALKPHOS 73 08/14/2017 0811   AST 14 (L) 04/23/2018 0811   AST 10 08/14/2017 0811   ALT 16 04/23/2018 0811   ALT 10 08/14/2017 0811   BILITOT 0.7 04/23/2018 0811   BILITOT 0.62 08/14/2017 0811     STUDIES: She will be due for repeat staging PET scan later this year  ASSESSMENT: 32 y.o.  [place], [place], woman  (1)  with a history of inflammatory right breast cancer metastatic at presentation September 2004 with involvement of liver and bone, HER-2 positive, estrogen and progesterone receptor  negative  (2) treated with carboplatin, docetaxel and Herceptin x 6 completed April 2005  (3) trastuzumab continued indefinitely;   (a) has also received lapatinib and capecitabine for variable intervals in 2007-2008.  (b) Every 6 month echo: 09/24/2017 showed an ejection fraction in the 55-60%  (c) echocardiogram 03/07/2018 showed an ejection fraction in the 55-60%  (4) status post bilateral mastectomies with bilateral axillary lymph node dissection 12/07/2004, showing  (a) on the right, a mypT1c ypN1 invasive ductal carcinoma, grade 3, estrogen and progesterone receptor negative, HER-2 positive, with an MIB-1 of 31%  (b) on the left, ypT2 ypN1 invasive ductal carcinoma, grade 2, estrogen and progesterone receptor negative, HER-2 positive, with an MIB-1 of 35%.  (5)  Status post radiation June through July of 2006, to the right chest wall, left chest wall, bilateral supraclavicular fossae, and bilateral axillary boosts; with additional radiation to the right and left chest walls and the central chest wall completed November of 2007  (6) status post Ixempra x9 completed August of 2009.  (7) history of superior vena caval syndrome, on life long anticoagulation   (8)  History of chemotherapy-induced neuropathy.   (9)  chronic pain, with negative PET scan 08/24/2013 (no evidence of active cancer). On Neurontin and Tramadol  (a) repeat PET scan December 2016 again negative.  (b) Repeat PET scan 04/23/2017 shows no active malignancy  (10) right upper extremity cellulitis, no bacteremia; treated with cephalexin /doxycycline for 2 weeks, with resolution  (11 subacute fracture right humerus, nondisplaced, status post fall 03/27/2017   PLAN: Stellarose is now 15 years out from definitive diagnosis of metastatic breast cancer with no evidence of active disease.  This is very favorable.  She continues to tolerate trastuzumab without any side effects that she is aware  of.  The plan is to continue  that on an every four-week basis indefinitely.  She had her most recent echocardiogram in July.  The next one will be done 6 months later, around January 2020.  She continues on lifelong anticoagulation with Coumadin.  Her INR today is pending but she will make sure to get those results before she leaves  She will see me again in 3 months, as we usually do.  Before that visit she will have her yearly restaging PET scan  I am hopeful her husband will do well with his current problems  She knows to call for any other issues that may develop before the next visit.   Melaine Mcphee, Virgie Dad, MD  04/23/18 8:58 AM Medical Oncology and Hematology [facility] [address] [place], [place] [address] Tel. [phone]    Fax. [phone]  Alice Rieger, am acting as scribe for Chauncey Cruel MD.  I, Lurline Del MD, have reviewed the above documentation for accuracy and completeness, and I agree with the above.

## 2018-04-23 ENCOUNTER — Telehealth: Payer: Self-pay | Admitting: Oncology

## 2018-04-23 ENCOUNTER — Inpatient Hospital Stay: Payer: Medicare Other

## 2018-04-23 ENCOUNTER — Inpatient Hospital Stay: Payer: Medicare Other | Attending: Oncology

## 2018-04-23 ENCOUNTER — Inpatient Hospital Stay (HOSPITAL_BASED_OUTPATIENT_CLINIC_OR_DEPARTMENT_OTHER): Payer: Medicare Other | Admitting: Oncology

## 2018-04-23 VITALS — BP 159/78

## 2018-04-23 VITALS — BP 182/94 | HR 72 | Temp 98.2°F | Resp 16 | Ht 63.0 in | Wt 280.5 lb

## 2018-04-23 DIAGNOSIS — C787 Secondary malignant neoplasm of liver and intrahepatic bile duct: Secondary | ICD-10-CM

## 2018-04-23 DIAGNOSIS — C50812 Malignant neoplasm of overlapping sites of left female breast: Secondary | ICD-10-CM | POA: Diagnosis present

## 2018-04-23 DIAGNOSIS — C7951 Secondary malignant neoplasm of bone: Secondary | ICD-10-CM

## 2018-04-23 DIAGNOSIS — G620 Drug-induced polyneuropathy: Secondary | ICD-10-CM | POA: Diagnosis not present

## 2018-04-23 DIAGNOSIS — C50911 Malignant neoplasm of unspecified site of right female breast: Secondary | ICD-10-CM | POA: Diagnosis not present

## 2018-04-23 DIAGNOSIS — C50919 Malignant neoplasm of unspecified site of unspecified female breast: Secondary | ICD-10-CM

## 2018-04-23 DIAGNOSIS — C50912 Malignant neoplasm of unspecified site of left female breast: Secondary | ICD-10-CM | POA: Insufficient documentation

## 2018-04-23 DIAGNOSIS — Z95828 Presence of other vascular implants and grafts: Secondary | ICD-10-CM

## 2018-04-23 DIAGNOSIS — C50011 Malignant neoplasm of nipple and areola, right female breast: Secondary | ICD-10-CM

## 2018-04-23 DIAGNOSIS — Z7901 Long term (current) use of anticoagulants: Secondary | ICD-10-CM

## 2018-04-23 DIAGNOSIS — Z5112 Encounter for antineoplastic immunotherapy: Secondary | ICD-10-CM | POA: Insufficient documentation

## 2018-04-23 DIAGNOSIS — C50012 Malignant neoplasm of nipple and areola, left female breast: Secondary | ICD-10-CM

## 2018-04-23 LAB — COMPREHENSIVE METABOLIC PANEL
ALBUMIN: 3.5 g/dL (ref 3.5–5.0)
ALT: 16 U/L (ref 0–44)
AST: 14 U/L — AB (ref 15–41)
Alkaline Phosphatase: 67 U/L (ref 38–126)
Anion gap: 8 (ref 5–15)
BILIRUBIN TOTAL: 0.7 mg/dL (ref 0.3–1.2)
BUN: 19 mg/dL (ref 8–23)
CO2: 30 mmol/L (ref 22–32)
Calcium: 9.9 mg/dL (ref 8.9–10.3)
Chloride: 107 mmol/L (ref 98–111)
Creatinine, Ser: 0.92 mg/dL (ref 0.44–1.00)
GFR calc Af Amer: >60 mL/min (ref >60)
GFR calc non Af Amer: >60 mL/min (ref >60)
GLUCOSE: 99 mg/dL (ref 70–99)
Potassium: 3.9 mmol/L (ref 3.5–5.1)
SODIUM: 145 mmol/L (ref 135–145)
TOTAL PROTEIN: 7.6 g/dL (ref 6.5–8.1)

## 2018-04-23 LAB — CBC WITH DIFFERENTIAL/PLATELET
BASOS ABS: 0 10*3/uL (ref 0.0–0.1)
Basophils Relative: 0 %
EOS PCT: 1 %
Eosinophils Absolute: 0 10*3/uL (ref 0.0–0.5)
HEMATOCRIT: 35 % (ref 34.8–46.6)
Hemoglobin: 12 g/dL (ref 11.6–15.9)
LYMPHS ABS: 1.7 10*3/uL (ref 0.9–3.3)
Lymphocytes Relative: 25 %
MCH: 29.2 pg (ref 25.1–34.0)
MCHC: 34.3 g/dL (ref 31.5–36.0)
MCV: 85.2 fL (ref 79.5–101.0)
MONO ABS: 0.6 10*3/uL (ref 0.1–0.9)
MONOS PCT: 8 %
NEUTROS ABS: 4.7 10*3/uL (ref 1.5–6.5)
Neutrophils Relative %: 66 %
PLATELETS: 235 10*3/uL (ref 145–400)
RBC: 4.11 MIL/uL (ref 3.70–5.45)
RDW: 15.8 % — ABNORMAL HIGH (ref 11.2–14.5)
WBC: 7 10*3/uL (ref 3.9–10.3)

## 2018-04-23 LAB — PROTIME-INR
INR: 1.27
PROTHROMBIN TIME: 15.8 s — AB (ref 11.4–15.2)

## 2018-04-23 MED ORDER — ACETAMINOPHEN 325 MG PO TABS
ORAL_TABLET | ORAL | Status: AC
  Filled 2018-04-23: qty 2

## 2018-04-23 MED ORDER — SODIUM CHLORIDE 0.9 % IV SOLN
Freq: Once | INTRAVENOUS | Status: AC
  Administered 2018-04-23: 10:00:00 via INTRAVENOUS
  Filled 2018-04-23: qty 250

## 2018-04-23 MED ORDER — DIPHENHYDRAMINE HCL 25 MG PO CAPS
25.0000 mg | ORAL_CAPSULE | Freq: Once | ORAL | Status: AC
  Administered 2018-04-23: 25 mg via ORAL

## 2018-04-23 MED ORDER — SODIUM CHLORIDE 0.9 % IJ SOLN
10.0000 mL | INTRAMUSCULAR | Status: DC | PRN
  Filled 2018-04-23: qty 10

## 2018-04-23 MED ORDER — LORAZEPAM 2 MG/ML IJ SOLN
1.0000 mg | Freq: Once | INTRAMUSCULAR | Status: AC | PRN
  Administered 2018-04-23: 1 mg via INTRAVENOUS

## 2018-04-23 MED ORDER — TRASTUZUMAB CHEMO 150 MG IV SOLR
798.0000 mg | Freq: Once | INTRAVENOUS | Status: AC
  Administered 2018-04-23: 798 mg via INTRAVENOUS
  Filled 2018-04-23: qty 38

## 2018-04-23 MED ORDER — SODIUM CHLORIDE 0.9 % IJ SOLN
10.0000 mL | INTRAMUSCULAR | Status: DC | PRN
Start: 2018-04-23 — End: 2018-04-23
  Administered 2018-04-23: 10 mL
  Filled 2018-04-23: qty 10

## 2018-04-23 MED ORDER — HEPARIN SOD (PORK) LOCK FLUSH 100 UNIT/ML IV SOLN
500.0000 [IU] | Freq: Once | INTRAVENOUS | Status: AC | PRN
  Administered 2018-04-23: 500 [IU]
  Filled 2018-04-23: qty 5

## 2018-04-23 MED ORDER — ACETAMINOPHEN 325 MG PO TABS
650.0000 mg | ORAL_TABLET | Freq: Once | ORAL | Status: AC
  Administered 2018-04-23: 650 mg via ORAL

## 2018-04-23 MED ORDER — LORAZEPAM 2 MG/ML IJ SOLN
INTRAMUSCULAR | Status: AC
Start: 2018-04-23 — End: ?
  Filled 2018-04-23: qty 1

## 2018-04-23 MED ORDER — DIPHENHYDRAMINE HCL 25 MG PO CAPS
ORAL_CAPSULE | ORAL | Status: AC
  Filled 2018-04-23: qty 1

## 2018-04-23 NOTE — Progress Notes (Signed)
Pt had labs drawn peripherally in the laboratory. arrived pt to drs appointment

## 2018-04-23 NOTE — Patient Instructions (Signed)
[facility] Discharge Instructions for Patients Receiving Chemotherapy  Today you received the following chemotherapy agents Herceptin  To help prevent nausea and vomiting after your treatment, we encourage you to take your nausea medication as directed   If you develop nausea and vomiting that is not controlled by your nausea medication, call the clinic.   BELOW ARE SYMPTOMS THAT SHOULD BE REPORTED IMMEDIATELY:  *FEVER GREATER THAN 100.5 F  *CHILLS WITH OR WITHOUT FEVER  NAUSEA AND VOMITING THAT IS NOT CONTROLLED WITH YOUR NAUSEA MEDICATION  *UNUSUAL SHORTNESS OF BREATH  *UNUSUAL BRUISING OR BLEEDING  TENDERNESS IN MOUTH AND THROAT WITH OR WITHOUT PRESENCE OF ULCERS  *URINARY PROBLEMS  *BOWEL PROBLEMS  UNUSUAL RASH Items with * indicate a potential emergency and should be followed up as soon as possible.  Feel free to call the clinic should you have any questions or concerns. The clinic phone number is [phone].  Please show the CHEMO ALERT CARD at check-in to the Emergency Department and triage nurse.   

## 2018-04-23 NOTE — Telephone Encounter (Signed)
Gave avs and calendar ° °

## 2018-04-29 ENCOUNTER — Other Ambulatory Visit: Payer: Self-pay | Admitting: *Deleted

## 2018-04-29 MED ORDER — ALPRAZOLAM 1 MG PO TABS: 1.0000 mg | ORAL_TABLET | Freq: Three times a day (TID) | ORAL | 1 refills | Status: DC | PRN

## 2018-05-21 ENCOUNTER — Telehealth: Payer: Self-pay

## 2018-05-21 ENCOUNTER — Inpatient Hospital Stay: Payer: Medicare Other

## 2018-05-21 ENCOUNTER — Inpatient Hospital Stay: Payer: Medicare Other | Attending: Oncology

## 2018-05-21 ENCOUNTER — Other Ambulatory Visit: Payer: Self-pay | Admitting: Hematology and Oncology

## 2018-05-21 DIAGNOSIS — Z7901 Long term (current) use of anticoagulants: Secondary | ICD-10-CM | POA: Insufficient documentation

## 2018-05-21 DIAGNOSIS — C50911 Malignant neoplasm of unspecified site of right female breast: Secondary | ICD-10-CM

## 2018-05-21 DIAGNOSIS — C50812 Malignant neoplasm of overlapping sites of left female breast: Secondary | ICD-10-CM | POA: Insufficient documentation

## 2018-05-21 DIAGNOSIS — I871 Compression of vein: Secondary | ICD-10-CM | POA: Insufficient documentation

## 2018-05-21 DIAGNOSIS — Z5112 Encounter for antineoplastic immunotherapy: Secondary | ICD-10-CM | POA: Insufficient documentation

## 2018-05-21 DIAGNOSIS — C50912 Malignant neoplasm of unspecified site of left female breast: Secondary | ICD-10-CM

## 2018-05-21 DIAGNOSIS — Z95828 Presence of other vascular implants and grafts: Secondary | ICD-10-CM

## 2018-05-21 DIAGNOSIS — C50919 Malignant neoplasm of unspecified site of unspecified female breast: Secondary | ICD-10-CM

## 2018-05-21 LAB — COMPREHENSIVE METABOLIC PANEL
ALT: 14 U/L (ref 0–44)
AST: 12 U/L — AB (ref 15–41)
Albumin: 3.5 g/dL (ref 3.5–5.0)
Alkaline Phosphatase: 72 U/L (ref 38–126)
Anion gap: 9 (ref 5–15)
BUN: 18 mg/dL (ref 8–23)
CHLORIDE: 107 mmol/L (ref 98–111)
CO2: 26 mmol/L (ref 22–32)
CREATININE: 0.8 mg/dL (ref 0.44–1.00)
Calcium: 9.4 mg/dL (ref 8.9–10.3)
GFR calc Af Amer: >60 mL/min (ref >60)
Glucose, Bld: 101 mg/dL — ABNORMAL HIGH (ref 70–99)
POTASSIUM: 4.1 mmol/L (ref 3.5–5.1)
SODIUM: 142 mmol/L (ref 135–145)
Total Bilirubin: 0.8 mg/dL (ref 0.3–1.2)
Total Protein: 7.2 g/dL (ref 6.5–8.1)

## 2018-05-21 LAB — CBC WITH DIFFERENTIAL/PLATELET
BASOS ABS: 0 10*3/uL (ref 0.0–0.1)
BASOS PCT: 0 %
EOS ABS: 0 10*3/uL (ref 0.0–0.5)
EOS PCT: 0 %
HCT: 33.4 % — ABNORMAL LOW (ref 34.8–46.6)
Hemoglobin: 11.5 g/dL — ABNORMAL LOW (ref 11.6–15.9)
Lymphocytes Relative: 22 %
Lymphs Abs: 1.6 10*3/uL (ref 0.9–3.3)
MCH: 29.1 pg (ref 25.1–34.0)
MCHC: 34.4 g/dL (ref 31.5–36.0)
MCV: 84.6 fL (ref 79.5–101.0)
MONO ABS: 0.3 10*3/uL (ref 0.1–0.9)
Monocytes Relative: 5 %
Neutro Abs: 5.3 10*3/uL (ref 1.5–6.5)
Neutrophils Relative %: 73 %
PLATELETS: 229 10*3/uL (ref 145–400)
RBC: 3.95 MIL/uL (ref 3.70–5.45)
RDW: 15.9 % — AB (ref 11.2–14.5)
WBC: 7.2 10*3/uL (ref 3.9–10.3)

## 2018-05-21 LAB — PROTIME-INR
INR: 2.44
PROTHROMBIN TIME: 26.3 s — AB (ref 11.4–15.2)

## 2018-05-21 MED ORDER — ACETAMINOPHEN 325 MG PO TABS
650.0000 mg | ORAL_TABLET | Freq: Once | ORAL | Status: AC
  Administered 2018-05-21: 650 mg via ORAL

## 2018-05-21 MED ORDER — SODIUM CHLORIDE 0.9 % IV SOLN
Freq: Once | INTRAVENOUS | Status: AC
  Administered 2018-05-21: 09:00:00 via INTRAVENOUS
  Filled 2018-05-21: qty 250

## 2018-05-21 MED ORDER — HEPARIN SOD (PORK) LOCK FLUSH 100 UNIT/ML IV SOLN
500.0000 [IU] | Freq: Once | INTRAVENOUS | Status: AC | PRN
  Administered 2018-05-21: 500 [IU]
  Filled 2018-05-21: qty 5

## 2018-05-21 MED ORDER — ACETAMINOPHEN 325 MG PO TABS
ORAL_TABLET | ORAL | Status: AC
  Filled 2018-05-21: qty 2

## 2018-05-21 MED ORDER — SODIUM CHLORIDE 0.9 % IJ SOLN
10.0000 mL | INTRAMUSCULAR | Status: DC | PRN
  Administered 2018-05-21: 10 mL via INTRAVENOUS
  Filled 2018-05-21: qty 10

## 2018-05-21 MED ORDER — DIPHENHYDRAMINE HCL 25 MG PO CAPS
25.0000 mg | ORAL_CAPSULE | Freq: Once | ORAL | Status: AC
  Administered 2018-05-21: 25 mg via ORAL

## 2018-05-21 MED ORDER — SODIUM CHLORIDE 0.9 % IJ SOLN
10.0000 mL | INTRAMUSCULAR | Status: DC | PRN
  Administered 2018-05-21: 10 mL
  Filled 2018-05-21: qty 10

## 2018-05-21 MED ORDER — TRASTUZUMAB CHEMO 150 MG IV SOLR
798.0000 mg | Freq: Once | INTRAVENOUS | Status: AC
  Administered 2018-05-21: 798 mg via INTRAVENOUS
  Filled 2018-05-21: qty 38

## 2018-05-21 MED ORDER — DIPHENHYDRAMINE HCL 25 MG PO CAPS
ORAL_CAPSULE | ORAL | Status: AC
  Filled 2018-05-21: qty 1

## 2018-05-21 NOTE — Telephone Encounter (Signed)
Spoke with pt by phone regarding recent blood work / INR.  Per Wilber Bihari, NP pt to continue current dose of warfarin.   Pt verbalizes that she is taking 5mg  warfarin nightly.

## 2018-05-21 NOTE — Patient Instructions (Signed)
[facility] Discharge Instructions for Patients Receiving Chemotherapy  Today you received the following chemotherapy agents Herceptin  To help prevent nausea and vomiting after your treatment, we encourage you to take your nausea medication as directed   If you develop nausea and vomiting that is not controlled by your nausea medication, call the clinic.   BELOW ARE SYMPTOMS THAT SHOULD BE REPORTED IMMEDIATELY:  *FEVER GREATER THAN 100.5 F  *CHILLS WITH OR WITHOUT FEVER  NAUSEA AND VOMITING THAT IS NOT CONTROLLED WITH YOUR NAUSEA MEDICATION  *UNUSUAL SHORTNESS OF BREATH  *UNUSUAL BRUISING OR BLEEDING  TENDERNESS IN MOUTH AND THROAT WITH OR WITHOUT PRESENCE OF ULCERS  *URINARY PROBLEMS  *BOWEL PROBLEMS  UNUSUAL RASH Items with * indicate a potential emergency and should be followed up as soon as possible.  Feel free to call the clinic should you have any questions or concerns. The clinic phone number is [phone].  Please show the CHEMO ALERT CARD at check-in to the Emergency Department and triage nurse.   

## 2018-05-26 ENCOUNTER — Other Ambulatory Visit (HOSPITAL_COMMUNITY): Payer: Self-pay

## 2018-05-26 MED ORDER — SPIRONOLACTONE 25 MG PO TABS: 25.0000 mg | ORAL_TABLET | Freq: Every day | ORAL | 5 refills | Status: DC

## 2018-05-30 DIAGNOSIS — Z6841 Body Mass Index (BMI) 40.0 and over, adult: Secondary | ICD-10-CM | POA: Diagnosis not present

## 2018-05-30 DIAGNOSIS — Z1331 Encounter for screening for depression: Secondary | ICD-10-CM | POA: Diagnosis not present

## 2018-05-30 DIAGNOSIS — Z1339 Encounter for screening examination for other mental health and behavioral disorders: Secondary | ICD-10-CM | POA: Diagnosis not present

## 2018-05-30 DIAGNOSIS — Z0000 Encounter for general adult medical examination without abnormal findings: Secondary | ICD-10-CM | POA: Diagnosis not present

## 2018-05-30 DIAGNOSIS — Z7189 Other specified counseling: Secondary | ICD-10-CM | POA: Diagnosis not present

## 2018-05-30 DIAGNOSIS — M170 Bilateral primary osteoarthritis of knee: Secondary | ICD-10-CM | POA: Diagnosis not present

## 2018-06-05 DIAGNOSIS — M170 Bilateral primary osteoarthritis of knee: Secondary | ICD-10-CM | POA: Diagnosis not present

## 2018-06-18 ENCOUNTER — Inpatient Hospital Stay: Payer: Medicare Other

## 2018-06-18 ENCOUNTER — Inpatient Hospital Stay: Payer: Medicare Other | Attending: Oncology

## 2018-06-18 VITALS — BP 127/78 | HR 60 | Temp 98.6°F | Resp 17 | Ht 63.0 in | Wt 278.0 lb

## 2018-06-18 DIAGNOSIS — Z7901 Long term (current) use of anticoagulants: Secondary | ICD-10-CM | POA: Insufficient documentation

## 2018-06-18 DIAGNOSIS — Z5112 Encounter for antineoplastic immunotherapy: Secondary | ICD-10-CM | POA: Diagnosis not present

## 2018-06-18 DIAGNOSIS — C50911 Malignant neoplasm of unspecified site of right female breast: Secondary | ICD-10-CM

## 2018-06-18 DIAGNOSIS — C50912 Malignant neoplasm of unspecified site of left female breast: Secondary | ICD-10-CM

## 2018-06-18 DIAGNOSIS — C50812 Malignant neoplasm of overlapping sites of left female breast: Secondary | ICD-10-CM | POA: Diagnosis not present

## 2018-06-18 DIAGNOSIS — C7951 Secondary malignant neoplasm of bone: Secondary | ICD-10-CM | POA: Diagnosis not present

## 2018-06-18 DIAGNOSIS — C50919 Malignant neoplasm of unspecified site of unspecified female breast: Secondary | ICD-10-CM

## 2018-06-18 LAB — CBC WITH DIFFERENTIAL/PLATELET
Abs Immature Granulocytes: 0.07 10*3/uL (ref 0.00–0.07)
BASOS ABS: 0 10*3/uL (ref 0.0–0.1)
Basophils Relative: 0 %
EOS PCT: 1 %
Eosinophils Absolute: 0.1 10*3/uL (ref 0.0–0.5)
HEMATOCRIT: 35.6 % — AB (ref 36.0–46.0)
HEMOGLOBIN: 12.3 g/dL (ref 12.0–15.0)
Immature Granulocytes: 1 %
LYMPHS ABS: 2.3 10*3/uL (ref 0.7–4.0)
LYMPHS PCT: 23 %
MCH: 29.4 pg (ref 26.0–34.0)
MCHC: 34.6 g/dL (ref 30.0–36.0)
MCV: 85 fL (ref 80.0–100.0)
MONO ABS: 0.7 10*3/uL (ref 0.1–1.0)
MONOS PCT: 7 %
Neutro Abs: 6.6 10*3/uL (ref 1.7–7.7)
Neutrophils Relative %: 68 %
Platelets: 273 10*3/uL (ref 150–400)
RBC: 4.19 MIL/uL (ref 3.87–5.11)
RDW: 15.8 % — AB (ref 11.5–15.5)
WBC: 9.8 10*3/uL (ref 4.0–10.5)
nRBC: 0.4 % — ABNORMAL HIGH (ref 0.0–0.2)

## 2018-06-18 LAB — COMPREHENSIVE METABOLIC PANEL
ALT: 11 U/L (ref 0–44)
AST: 10 U/L — ABNORMAL LOW (ref 15–41)
Albumin: 3.5 g/dL (ref 3.5–5.0)
Alkaline Phosphatase: 65 U/L (ref 38–126)
Anion gap: 10 (ref 5–15)
BUN: 22 mg/dL (ref 8–23)
CO2: 25 mmol/L (ref 22–32)
Calcium: 9.4 mg/dL (ref 8.9–10.3)
Chloride: 109 mmol/L (ref 98–111)
Creatinine, Ser: 0.99 mg/dL (ref 0.44–1.00)
GFR, EST NON AFRICAN AMERICAN: 58 mL/min — AB (ref >60)
GLUCOSE: 93 mg/dL (ref 70–99)
Potassium: 4 mmol/L (ref 3.5–5.1)
SODIUM: 144 mmol/L (ref 135–145)
Total Bilirubin: 0.7 mg/dL (ref 0.3–1.2)
Total Protein: 7.5 g/dL (ref 6.5–8.1)

## 2018-06-18 LAB — PROTIME-INR
INR: 1.65
PROTHROMBIN TIME: 19.3 s — AB (ref 11.4–15.2)

## 2018-06-18 MED ORDER — TRASTUZUMAB CHEMO 150 MG IV SOLR
798.0000 mg | Freq: Once | INTRAVENOUS | Status: AC
  Administered 2018-06-18: 798 mg via INTRAVENOUS
  Filled 2018-06-18: qty 38

## 2018-06-18 MED ORDER — ACETAMINOPHEN 325 MG PO TABS
650.0000 mg | ORAL_TABLET | Freq: Once | ORAL | Status: AC
  Administered 2018-06-18: 650 mg via ORAL

## 2018-06-18 MED ORDER — LORAZEPAM 2 MG/ML IJ SOLN
INTRAMUSCULAR | Status: AC
  Filled 2018-06-18: qty 1

## 2018-06-18 MED ORDER — ACETAMINOPHEN 325 MG PO TABS
ORAL_TABLET | ORAL | Status: AC
  Filled 2018-06-18: qty 2

## 2018-06-18 MED ORDER — DIPHENHYDRAMINE HCL 25 MG PO CAPS
ORAL_CAPSULE | ORAL | Status: AC
  Filled 2018-06-18: qty 1

## 2018-06-18 MED ORDER — DIPHENHYDRAMINE HCL 25 MG PO CAPS
25.0000 mg | ORAL_CAPSULE | Freq: Once | ORAL | Status: AC
  Administered 2018-06-18: 25 mg via ORAL

## 2018-06-18 MED ORDER — HEPARIN SOD (PORK) LOCK FLUSH 100 UNIT/ML IV SOLN
500.0000 [IU] | Freq: Once | INTRAVENOUS | Status: AC | PRN
  Administered 2018-06-18: 500 [IU]
  Filled 2018-06-18: qty 5

## 2018-06-18 MED ORDER — SODIUM CHLORIDE 0.9 % IJ SOLN
10.0000 mL | INTRAMUSCULAR | Status: DC | PRN
  Administered 2018-06-18: 10 mL
  Filled 2018-06-18: qty 10

## 2018-06-18 MED ORDER — SODIUM CHLORIDE 0.9 % IV SOLN
Freq: Once | INTRAVENOUS | Status: AC
  Administered 2018-06-18: 10:00:00 via INTRAVENOUS
  Filled 2018-06-18: qty 250

## 2018-06-18 MED ORDER — LORAZEPAM 2 MG/ML IJ SOLN
1.0000 mg | Freq: Once | INTRAMUSCULAR | Status: AC | PRN
  Administered 2018-06-18: 1 mg via INTRAVENOUS

## 2018-06-18 NOTE — Patient Instructions (Signed)
[facility] Discharge Instructions for Patients Receiving Chemotherapy  Today you received the following chemotherapy agents: Trastuzumab (Herceptin)  To help prevent nausea and vomiting after your treatment, we encourage you to take your nausea medication as directed.    If you develop nausea and vomiting that is not controlled by your nausea medication, call the clinic.   BELOW ARE SYMPTOMS THAT SHOULD BE REPORTED IMMEDIATELY:  *FEVER GREATER THAN 100.5 F  *CHILLS WITH OR WITHOUT FEVER  NAUSEA AND VOMITING THAT IS NOT CONTROLLED WITH YOUR NAUSEA MEDICATION  *UNUSUAL SHORTNESS OF BREATH  *UNUSUAL BRUISING OR BLEEDING  TENDERNESS IN MOUTH AND THROAT WITH OR WITHOUT PRESENCE OF ULCERS  *URINARY PROBLEMS  *BOWEL PROBLEMS  UNUSUAL RASH Items with * indicate a potential emergency and should be followed up as soon as possible.  Feel free to call the clinic should you have any questions or concerns. The clinic phone number is [phone].  Please show the CHEMO ALERT CARD at check-in to the Emergency Department and triage nurse.    

## 2018-06-23 ENCOUNTER — Other Ambulatory Visit: Payer: Self-pay | Admitting: Oncology

## 2018-07-08 DIAGNOSIS — I10 Essential (primary) hypertension: Secondary | ICD-10-CM | POA: Diagnosis not present

## 2018-07-08 DIAGNOSIS — M170 Bilateral primary osteoarthritis of knee: Secondary | ICD-10-CM | POA: Diagnosis not present

## 2018-07-08 DIAGNOSIS — Z6841 Body Mass Index (BMI) 40.0 and over, adult: Secondary | ICD-10-CM | POA: Diagnosis not present

## 2018-07-08 DIAGNOSIS — G629 Polyneuropathy, unspecified: Secondary | ICD-10-CM | POA: Diagnosis not present

## 2018-07-09 ENCOUNTER — Encounter: Payer: Self-pay | Admitting: Oncology

## 2018-07-09 ENCOUNTER — Encounter (HOSPITAL_COMMUNITY): Payer: Self-pay

## 2018-07-09 ENCOUNTER — Ambulatory Visit (HOSPITAL_COMMUNITY)
Admission: RE | Admit: 2018-07-09 | Discharge: 2018-07-09 | Disposition: A | Discharge status: Home / Self Care | Payer: Medicare Other | Source: Ambulatory Visit | Attending: Oncology | Admitting: Oncology

## 2018-07-09 DIAGNOSIS — C50012 Malignant neoplasm of nipple and areola, left female breast: Secondary | ICD-10-CM | POA: Diagnosis not present

## 2018-07-09 DIAGNOSIS — C50919 Malignant neoplasm of unspecified site of unspecified female breast: Secondary | ICD-10-CM | POA: Insufficient documentation

## 2018-07-09 DIAGNOSIS — C50011 Malignant neoplasm of nipple and areola, right female breast: Secondary | ICD-10-CM | POA: Diagnosis not present

## 2018-07-09 LAB — GLUCOSE, CAPILLARY: GLUCOSE-CAPILLARY: 87 mg/dL (ref 70–99)

## 2018-07-09 MED ORDER — FLUDEOXYGLUCOSE F - 18 (FDG) INJECTION
13.8100 | Freq: Once | INTRAVENOUS | Status: AC | PRN
  Administered 2018-07-09: 13.81 via INTRAVENOUS

## 2018-07-15 NOTE — Progress Notes (Signed)
[facility]  [phone] [phone]    ID: [name]   DOB: 17-Mar-1951  MR#: [id number]  OPF#:[id number]   Patient Care Team: Yolanda Kiel, MD as PCP - General (Internal Medicine) [name]., MD as Consulting Physician (Pain Medicine) Yolanda Salm, MD as Referring Physician (Orthopedic Surgery) Yolanda Graff, DO as Consulting Physician (Orthopedic Surgery) Yolanda Dresser, MD as Consulting Physician (Cardiology) Bensimhon, Shaune Pascal, MD as Consulting Physician (Cardiology) Yolanda Gibbs, MD (Radiology) [name], Yolanda Islam, MD as Consulting Physician (Infectious Diseases) Yolanda Pray, MD as Consulting Physician (Radiation Oncology) Magrinat, Virgie Dad, MD as Consulting Physician (Oncology)   CC: Metastatic HER-2 positive, estrogen receptor negative breast cancer  CURRENT TREATMENT: Trastuzumab every 28 days; lifelong warfarin   INTERVAL HISTORY: [name] returns today for follow-up and treatment of her estrogen receptor negative, but HER2 amplified breast cancer.   The patient continues on trastuzumab every 4 weeks, with good tolerance.  His recent echocardiogram was 03/07/2018 an ejection fraction in the 55-60%.  Since her last visit here, she underwent a PET scan on 07/09/2018 showing no specific findings identified to suggest residual or recurrent hypermetabolic tumor or metastatic disease. Geographic bandlike area of ground-glass attenuation and increased uptake identified within the posterolateral right upper lobe. In the setting of external beam radiation to the right chest wall findings are favored to represent post treatment changes. Posttraumatic deformity involving the anterior aspect of the right seventh rib with corresponding increased radiotracer uptake. Correlation with any recent history of trauma to this area suggested.  Her most recent echocardiogram on 03/07/2018 showed an ejection fraction in the 55-60%  range.  She also has a history of superior vena cava syndrome and is on lifelong anticoagulation with Coumadin, which she has tolerated well. She denies issues with bleeding.   REVIEW OF SYSTEMS: [name] is under significant stress at present as her husband who has significant kidney disease and diabetes has lost his right leg, then injured it in a fall, so he is home and she is his primary caregiver.  There is also a marital issue with 1 of her daughters and she is at her home as well.  Despite this [name] is able to get a little bit of walking gone and a little bit of yard work done.  She has significant insomnia.  She denies any unusual headaches, visual changes, cough, phlegm production, pleurisy, shortness of breath, or other symptoms.  A detailed review of systems today was otherwise stable.  BREAST CANCER HISTORY:   From the earlier summary:  Yesli [name] is 67 years old [name] female.  This woman has been in good health all of her life.  She noted a swelling and discomfort in her right breast in June 2004.  She was seen in the Emergency Room in [place] and was treated for mastitis.  She was treated for a number of months with mastitis and the swelling did not get better. She was given hydrocodone and Cipro.  Finally, the swelling did get better and ultimately the nipple became retracted and she noticed some dimpling in her skin. She had a mammogram in July of 2004 in [place] with subsequent mammogram on May 27, 2003, by Dr. Isaiah Blakes.  Mammogram done on September 30 showed marked increased density in the left breast.  Biopsy was performed the same day.  It was noted at the 12 o'clock position, deep in the breast was a focal hypoechoic mass, at least 3.5 cm in  diameter.  Biopsy did in fact show invasive in situ mammary carcinoma. This was felt to be both at least intermediate, high grade.  No definite lymphovascular invasion was identified. ER and PR negative, Her2 testing positive.  [name] continues to have pain in her breast.  She continues to take hydrocodone a number of times a day.  She has been seen by Dr. Rosana Hoes, who felt that neoadjuvant chemotherapy would be required.    Initial staging studies showed evidence of liver and lung mets.   Patient also has evidence of bone lesions. Patient started neoadjuvant chemotherapy, Taxotere/Carbo/Herceptin in October 2004.   Patient had a CT scan in December 2004 which demonstrated extensive clot in the SVC innominate vein, bilateral jugular vein and  She was started on anticoagulation therapy. She received a total of 6 cycles of Taxotere/Carbo/Herceptin, completed in April 2005."  Patient has been on  trastuzumab continued indefinitely; has also received lapatinib and capecitabine for variable intervals in 2007-2008. Most recent echo 12/01/2013 showed an ejection fraction of 55%. She is status post bilateral mastectomies with bilateral axillary lymph node dissection 12/07/2004, showing (a) on the right, a mypT1c ypN1 invasive ductal carcinoma, grade 3, estrogen and progesterone receptor negative, HER-2 positive, with an MIB-1 of 31% (b) on the left, ypT2 ypN1 invasive ductal carcinoma, grade 2, estrogen and progesterone receptor negative, HER-2 positive, with an MIB-1 of 35%. She is Status post radiation June through July of 2006, to the right chest wall, left chest wall, bilateral supraclavicular fossae, and bilateral axillary boosts; with additional radiation to the right and left chest walls and the central chest wall completed November of 2007. She is status post ixempra x9 completed August of 2009. 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 Patient has chronic pain, with negative PET scan 08/24/2013 (no evidence of active cancer). On Neurontin and Tramadol therapy.  Her subsequent history is as detailed below  PAST MEDICAL HISTORY: Past Medical  History:  Diagnosis Date  . Breast cancer ([place])    mets to liver and lung  . Breast cancer metastasized to multiple sites ([place]) 02/26/2013  . History of chemotherapy Feb. 2006   taxotere/herceptin/carboplatin  . Hypertension   . Neuropathy   . Radiation 07/31/2006   left upper chest  . Radiation 06/17/2006-06/27/2006   6480 cGy bilat. chest wall  . SVC syndrome   . Thrombosis     PAST SURGICAL HISTORY: Past Surgical History:  Procedure Laterality Date  . ANKLE SURGERY    . BACK SURGERY    . CHOLECYSTECTOMY  1989  . MASTECTOMY Bilateral   . PERIPHERALLY INSERTED CENTRAL CATHETER INSERTION    . TUBAL LIGATION  1986    FAMILY HISTORY Family History  Problem Relation Age of Onset  . Heart failure Father   . Cancer Father        Prostate cancer  . Heart failure Brother   . Cancer Brother        Prostate cancer  . Diabetes Maternal Aunt    She had three brothers, one died of gunshot wound, one of complications of diabetes mellitus and one of myocardial infarction.  She has no sisters.  Mother died of complications of brain metastasis in 64.  Father has had a myocardial infarction in 1999.  No history of breast or ovarian cancer in the family.    GYNECOLOGIC HISTORY:   Menarche at age 25.  Gravida 3, para 3.  First  live birth at age 72.  No history of breast feeding. No history of hormonal replacement therapy.   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 He used to work as a Art gallery manager, but is now retired and disabled. The patient has three children, Monette who lives in [place] and works as a Hydrographic surveyor, Financial risk analyst who lives in [place] and works as a Administrator, and Ducor who lives in [place] and also works as a Hydrographic surveyor. The patient has 14 grandchildren and 4 great-grandchildren. She attends a [employer]. Very  involved with school kids.  ADVANCED DIRECTIVES:  Not in place  HEALTH MAINTENANCE: (Updated 06/12/2013) Social History   Tobacco Use  . Smoking status: Never Smoker  . Smokeless tobacco: Never Used  Substance Use Topics  . Alcohol use: Yes    Comment: occasional  . Drug use: No     Colonoscopy: Never and "I don't want one"  PAP:  1987  Bone density:  Never   Lipid panel:  Not on file    Allergies  Allergen Reactions  . Penicillins Hives    PATIENT HAS TOLERATED CEPHALOSPORINGS  . Adhesive [Tape] Other (See Comments)    Tears skin     Current Outpatient Medications  Medication Sig Dispense Refill  . acetaminophen (TYLENOL) 500 MG tablet Take 1,000 mg by mouth every 6 (six) hours as needed for mild pain or fever.     Marland Kitchen albuterol (PROVENTIL HFA;VENTOLIN HFA) 108 (90 BASE) MCG/ACT inhaler Inhale 2 puffs into the lungs every 6 (six) hours as needed for wheezing. 1 Inhaler 5  . ALPRAZolam (XANAX) 1 MG tablet Take 1 tablet (1 mg total) by mouth 3 (three) times daily as needed. for anxiety 60 tablet 1  . amLODipine (NORVASC) 10 MG tablet Take 10 mg by mouth every morning.    . baclofen (LIORESAL) 10 MG tablet Take 1 tablet (10 mg total) by mouth 3 (three) times daily as needed. for muscle spams 270 tablet 3  . carvedilol (COREG) 6.25 MG tablet Take 1 tablet (6.25 mg total) by mouth 2 (two) times daily. 60 tablet 3  . diclofenac sodium (VOLTAREN) 1 % GEL Apply 2 g topically daily as needed (for pain). Apply to knees and shoulders 100 g 6  . furosemide (LASIX) 40 MG tablet Take 1 tablet (40 mg total) by mouth daily. 30 tablet 3  . gabapentin (NEURONTIN) 300 MG capsule TAKE 2 CAPSULES(600 MG) BY MOUTH THREE TIMES DAILY 540 capsule 0  . losartan (COZAAR) 100 MG tablet Take 100 mg by mouth every morning.    . potassium chloride (K-DUR,KLOR-CON) 20 MEQ tablet Take 1 tablet (20 mEq total) by mouth daily. 30 tablet 3  . spironolactone (ALDACTONE) 25 MG tablet Take 1 tablet (25 mg total)  by mouth daily. NEEDS OFFICE VISIT 30 tablet 5  . temazepam (RESTORIL) 30 MG capsule TAKE ONE CAPSULE BY MOUTH EVERY NIGHT AT BEDTIME AS NEEDED FOR SLEEP 30 capsule 2  . warfarin (COUMADIN) 2.5 MG tablet Use with 5 mg as directed by doctor for chronic anticoagulation and monitoring of INR 30 tablet 3  . warfarin (COUMADIN) 5 MG tablet Take 1 tablet (5 mg total) by mouth daily at 12 noon. 90 tablet 4   No current facility-administered medications for this visit.    Facility-Administered Medications Ordered in Other Visits  Medication  Dose Route Frequency Provider Last Rate Last Dose  . sodium chloride flush (NS) 0.9 % injection 10 mL  10 mL Intravenous PRN Magrinat, Virgie Dad, MD   10 mL at 12/15/15 1200    OBJECTIVE: Morbidly obese African-American woman who appears stated age   30:   07/16/18 0758  BP: 137/85  Pulse: 67  Resp: 18  Temp: 98.3 F (36.8 C)  SpO2: 97%     Body mass index is 48.04 kg/m.    ECOG FS: 1 Filed Weights   07/16/18 0758  Weight: 271 lb 3.2 oz (123 kg)   Sclerae unicteric, EOMs intact No cervical or supraclavicular adenopathy Lungs no rales or rhonchi Heart regular rate and rhythm Abd soft, nontender, positive bowel sounds MSK no focal spinal tenderness, no upper extremity lymphedema Neuro: nonfocal, well oriented, appropriate affect Breasts: Is post bilateral mastectomies.  There is no evidence of chest wall recurrence.  Both axillae are benign.   LAB RESULTS: Lab Results  Component Value Date   WBC 7.4 07/16/2018   NEUTROABS 4.6 07/16/2018   HGB 12.4 07/16/2018   HCT 35.7 (L) 07/16/2018   MCV 85.4 07/16/2018   PLT 267 07/16/2018      Chemistry      Component Value Date/Time   NA 144 06/18/2018 0913   NA 143 08/14/2017 0811   K 4.0 06/18/2018 0913   K 3.8 08/14/2017 0811   CL 109 06/18/2018 0913   CL 104 01/30/2013 0850   CO2 25 06/18/2018 0913   CO2 24 08/14/2017 0811   BUN 22 06/18/2018 0913   BUN 14.9 08/14/2017 0811    CREATININE 0.99 06/18/2018 0913   CREATININE 0.9 08/14/2017 0811      Component Value Date/Time   CALCIUM 9.4 06/18/2018 0913   CALCIUM 9.4 08/14/2017 0811   ALKPHOS 65 06/18/2018 0913   ALKPHOS 73 08/14/2017 0811   AST 10 (L) 06/18/2018 0913   AST 10 08/14/2017 0811   ALT 11 06/18/2018 0913   ALT 10 08/14/2017 0811   BILITOT 0.7 06/18/2018 0913   BILITOT 0.62 08/14/2017 0811     STUDIES: Nm Pet Image Restag (ps) Skull Base To Thigh  Result Date: 07/09/2018 CLINICAL DATA:  Subsequent treatment strategy for metastatic breast cancer. EXAM: NUCLEAR MEDICINE PET SKULL BASE TO THIGH TECHNIQUE: 13.8 mCi F-18 FDG was injected intravenously. Full-ring PET imaging was performed from the skull base to thigh after the radiotracer. CT data was obtained and used for attenuation correction and anatomic localization. Fasting blood glucose: 87 mg/dl COMPARISON:  04/23/2017 FINDINGS: Mediastinal blood pool activity: SUV max 3.23 NECK: No hypermetabolic lymph nodes in the neck. Incidental CT findings: none CHEST: No hypermetabolic axillary, supraclavicular, mediastinal or hilar lymph nodes. No pleural effusion identified. Geographic, bandlike area of ground-glass attenuation within the posterior and lateral right upper lobe is identified within SUV max of 7.08. Incidental CT findings: none ABDOMEN/PELVIS: No abnormal uptake identified within the liver, pancreas, or spleen. No abnormal uptake within the adrenal glands. No hypermetabolic lymph nodes identified within the abdomen. No hypermetabolic pelvic lymph nodes. Incidental CT findings: Previous cholecystectomy. Right kidney cysts noted. SKELETON: No focal hypermetabolic activity to suggest skeletal metastasis. Incidental CT findings: There is a fracture deformity involving the anterior seventh rib, image 41/8. Corresponding increased uptake has an SUV max of 4.73. there are postsurgical changes from bilateral mastectomy and bilateral axillary nodal dissection.  IMPRESSION: 1. No specific findings identified to suggest residual or recurrent hypermetabolic tumor or metastatic disease. 2.  Geographic bandlike area of ground-glass attenuation and increased uptake identified within the posterolateral right upper lobe. In the setting of external beam radiation to the right chest wall findings are favored to represent post treatment changes. 3. Posttraumatic deformity involving the anterior aspect of the right seventh rib with corresponding increased radiotracer uptake. Correlation with any recent history of trauma to this area suggested. Electronically Signed   By: Kerby Moors M.D.   On: 07/09/2018 12:38    ASSESSMENT: 67 y.o.  [place], [place], woman  (1)  with a history of inflammatory right breast cancer metastatic at presentation September 2004 with involvement of liver and bone, HER-2 positive, estrogen and progesterone receptor negative  (2) treated with carboplatin, docetaxel and Herceptin x 6 completed April 2005  (3) trastuzumab continued indefinitely;   (a) has also received lapatinib and capecitabine for variable intervals in 2007-2008.  (b) Every 6 month echo: 09/24/2017 showed an ejection fraction in the 55-60%  (c) echocardiogram 03/07/2018 showed an ejection fraction in the 55-60%  (4) status post bilateral mastectomies with bilateral axillary lymph node dissection 12/07/2004, showing  (a) on the right, a mypT1c ypN1 invasive ductal carcinoma, grade 3, estrogen and progesterone receptor negative, HER-2 positive, with an MIB-1 of 31%  (b) on the left, ypT2 ypN1 invasive ductal carcinoma, grade 2, estrogen and progesterone receptor negative, HER-2 positive, with an MIB-1 of 35%.  (5)  Status post radiation June through July of 2006, to the right chest wall, left chest wall, bilateral supraclavicular fossae, and bilateral axillary boosts; with additional radiation to the right and left chest walls and the central chest wall completed  November of 2007  (6) status post Ixempra x9 completed August of 2009.  (7) history of superior vena caval syndrome, on life long anticoagulation   (8)  History of chemotherapy-induced neuropathy.   (9)  chronic pain, with negative PET scan 08/24/2013 (no evidence of active cancer). On Neurontin and Tramadol  (a) repeat PET scan December 2016 again negative.  (b) Repeat PET scan 04/23/2017 shows no active malignancy  (c) PET scan 07/09/2018 shows no evidence of active malignancy.  (10) right upper extremity cellulitis, no bacteremia; treated with cephalexin /doxycycline for 2 weeks, with resolution  (11 subacute fracture right humerus, nondisplaced, status post fall 03/27/2017   PLAN: Jinelle is now a little over 15 years out from definitive diagnosis of static breast cancer, with no evidence of active disease.  This is very favorable.  She is tolerating trastuzumab well and the plan is to continue that every 28 days indefinitely.  She will be due for her next echocardiogram sometime in January.  She is under a great deal of stress right now because of her husband's and daughter's situation.  She is managing as well as could be expected.  There also has been no evidence of clotting or bleeding.  She continues on a very stable Coumadin dose  Next Herceptin will be 08/13/2018 and then she will see me again with her January dose, 09/10/2018.  She knows to call for any other issues that may develop before then.   Magrinat, Virgie Dad, MD  07/16/18 8:12 AM Medical Oncology and Hematology [facility] [address] [place], [place] [address] Tel. [phone]    Fax. [phone]  I, Margit Banda am acting as a scribe for Chauncey Cruel, MD.   I, Lurline Del MD, have reviewed the above documentation for accuracy and completeness, and I agree with the above.

## 2018-07-16 ENCOUNTER — Other Ambulatory Visit: Payer: Medicare Other

## 2018-07-16 ENCOUNTER — Inpatient Hospital Stay: Payer: Medicare Other | Attending: Oncology

## 2018-07-16 ENCOUNTER — Inpatient Hospital Stay: Payer: Medicare Other

## 2018-07-16 ENCOUNTER — Inpatient Hospital Stay (HOSPITAL_BASED_OUTPATIENT_CLINIC_OR_DEPARTMENT_OTHER): Payer: Medicare Other | Admitting: Oncology

## 2018-07-16 ENCOUNTER — Other Ambulatory Visit: Payer: Self-pay

## 2018-07-16 ENCOUNTER — Other Ambulatory Visit: Payer: Self-pay | Admitting: *Deleted

## 2018-07-16 ENCOUNTER — Telehealth: Payer: Self-pay | Admitting: Oncology

## 2018-07-16 VITALS — BP 137/85 | HR 67 | Temp 98.3°F | Resp 18 | Ht 63.0 in | Wt 271.2 lb

## 2018-07-16 DIAGNOSIS — Z79810 Long term (current) use of selective estrogen receptor modulators (SERMs): Secondary | ICD-10-CM | POA: Diagnosis not present

## 2018-07-16 DIAGNOSIS — C50011 Malignant neoplasm of nipple and areola, right female breast: Secondary | ICD-10-CM

## 2018-07-16 DIAGNOSIS — Z7901 Long term (current) use of anticoagulants: Secondary | ICD-10-CM | POA: Diagnosis not present

## 2018-07-16 DIAGNOSIS — I871 Compression of vein: Secondary | ICD-10-CM | POA: Diagnosis not present

## 2018-07-16 DIAGNOSIS — C787 Secondary malignant neoplasm of liver and intrahepatic bile duct: Secondary | ICD-10-CM

## 2018-07-16 DIAGNOSIS — Z23 Encounter for immunization: Secondary | ICD-10-CM | POA: Insufficient documentation

## 2018-07-16 DIAGNOSIS — C7951 Secondary malignant neoplasm of bone: Secondary | ICD-10-CM

## 2018-07-16 DIAGNOSIS — C50812 Malignant neoplasm of overlapping sites of left female breast: Secondary | ICD-10-CM | POA: Insufficient documentation

## 2018-07-16 DIAGNOSIS — Z5112 Encounter for antineoplastic immunotherapy: Secondary | ICD-10-CM | POA: Insufficient documentation

## 2018-07-16 DIAGNOSIS — C50912 Malignant neoplasm of unspecified site of left female breast: Secondary | ICD-10-CM

## 2018-07-16 DIAGNOSIS — Z171 Estrogen receptor negative status [ER-]: Secondary | ICD-10-CM | POA: Diagnosis not present

## 2018-07-16 DIAGNOSIS — C50919 Malignant neoplasm of unspecified site of unspecified female breast: Secondary | ICD-10-CM

## 2018-07-16 DIAGNOSIS — C50911 Malignant neoplasm of unspecified site of right female breast: Secondary | ICD-10-CM

## 2018-07-16 DIAGNOSIS — C50012 Malignant neoplasm of nipple and areola, left female breast: Secondary | ICD-10-CM

## 2018-07-16 LAB — CBC WITH DIFFERENTIAL/PLATELET
ABS IMMATURE GRANULOCYTES: 0.02 10*3/uL (ref 0.00–0.07)
Basophils Absolute: 0 10*3/uL (ref 0.0–0.1)
Basophils Relative: 0 %
Eosinophils Absolute: 0 10*3/uL (ref 0.0–0.5)
Eosinophils Relative: 1 %
HCT: 35.7 % — ABNORMAL LOW (ref 36.0–46.0)
HEMOGLOBIN: 12.4 g/dL (ref 12.0–15.0)
Immature Granulocytes: 0 %
LYMPHS PCT: 28 %
Lymphs Abs: 2.1 10*3/uL (ref 0.7–4.0)
MCH: 29.7 pg (ref 26.0–34.0)
MCHC: 34.7 g/dL (ref 30.0–36.0)
MCV: 85.4 fL (ref 80.0–100.0)
MONO ABS: 0.6 10*3/uL (ref 0.1–1.0)
MONOS PCT: 9 %
NEUTROS ABS: 4.6 10*3/uL (ref 1.7–7.7)
Neutrophils Relative %: 62 %
Platelets: 267 10*3/uL (ref 150–400)
RBC: 4.18 MIL/uL (ref 3.87–5.11)
RDW: 14.8 % (ref 11.5–15.5)
WBC: 7.4 10*3/uL (ref 4.0–10.5)
nRBC: 0 % (ref 0.0–0.2)

## 2018-07-16 LAB — PROTIME-INR
INR: 3.15
Prothrombin Time: 31.9 seconds — ABNORMAL HIGH (ref 11.4–15.2)

## 2018-07-16 LAB — COMPREHENSIVE METABOLIC PANEL
ALT: 13 U/L (ref 0–44)
AST: 11 U/L — ABNORMAL LOW (ref 15–41)
Albumin: 3.7 g/dL (ref 3.5–5.0)
Alkaline Phosphatase: 62 U/L (ref 38–126)
Anion gap: 8 (ref 5–15)
BUN: 25 mg/dL — ABNORMAL HIGH (ref 8–23)
CO2: 27 mmol/L (ref 22–32)
CREATININE: 1.14 mg/dL — AB (ref 0.44–1.00)
Calcium: 9.7 mg/dL (ref 8.9–10.3)
Chloride: 108 mmol/L (ref 98–111)
GFR, EST AFRICAN AMERICAN: 57 mL/min — AB (ref >60)
GFR, EST NON AFRICAN AMERICAN: 49 mL/min — AB (ref >60)
Glucose, Bld: 105 mg/dL — ABNORMAL HIGH (ref 70–99)
Potassium: 4.4 mmol/L (ref 3.5–5.1)
SODIUM: 143 mmol/L (ref 135–145)
Total Bilirubin: 0.5 mg/dL (ref 0.3–1.2)
Total Protein: 7.5 g/dL (ref 6.5–8.1)

## 2018-07-16 MED ORDER — TRASTUZUMAB CHEMO 150 MG IV SOLR
798.0000 mg | Freq: Once | INTRAVENOUS | Status: AC
  Administered 2018-07-16: 798 mg via INTRAVENOUS
  Filled 2018-07-16: qty 38

## 2018-07-16 MED ORDER — LORAZEPAM 2 MG/ML IJ SOLN
INTRAMUSCULAR | Status: AC
  Filled 2018-07-16: qty 1

## 2018-07-16 MED ORDER — ALPRAZOLAM 1 MG PO TABS: 1.0000 mg | ORAL_TABLET | Freq: Three times a day (TID) | ORAL | 1 refills | Status: DC | PRN

## 2018-07-16 MED ORDER — SODIUM CHLORIDE 0.9 % IJ SOLN
10.0000 mL | INTRAMUSCULAR | Status: DC | PRN
  Administered 2018-07-16: 10 mL
  Filled 2018-07-16: qty 10

## 2018-07-16 MED ORDER — DIPHENHYDRAMINE HCL 25 MG PO CAPS
ORAL_CAPSULE | ORAL | Status: AC
  Filled 2018-07-16: qty 1

## 2018-07-16 MED ORDER — HEPARIN SOD (PORK) LOCK FLUSH 100 UNIT/ML IV SOLN
500.0000 [IU] | Freq: Once | INTRAVENOUS | Status: AC | PRN
  Administered 2018-07-16: 500 [IU]
  Filled 2018-07-16: qty 5

## 2018-07-16 MED ORDER — ACETAMINOPHEN 325 MG PO TABS
650.0000 mg | ORAL_TABLET | Freq: Once | ORAL | Status: AC
  Administered 2018-07-16: 650 mg via ORAL

## 2018-07-16 MED ORDER — LORAZEPAM 2 MG/ML IJ SOLN
1.0000 mg | Freq: Once | INTRAMUSCULAR | Status: AC | PRN
  Administered 2018-07-16: 1 mg via INTRAVENOUS

## 2018-07-16 MED ORDER — ACETAMINOPHEN 325 MG PO TABS
ORAL_TABLET | ORAL | Status: AC
  Filled 2018-07-16: qty 2

## 2018-07-16 MED ORDER — INFLUENZA VAC SPLIT QUAD 0.5 ML IM SUSY
PREFILLED_SYRINGE | INTRAMUSCULAR | Status: AC
  Filled 2018-07-16: qty 0.5

## 2018-07-16 MED ORDER — INFLUENZA VAC SPLIT QUAD 0.5 ML IM SUSY
0.5000 mL | PREFILLED_SYRINGE | Freq: Once | INTRAMUSCULAR | Status: AC
  Administered 2018-07-16: 0.5 mL via INTRAMUSCULAR

## 2018-07-16 MED ORDER — SODIUM CHLORIDE 0.9 % IV SOLN
Freq: Once | INTRAVENOUS | Status: AC
  Administered 2018-07-16: 09:00:00 via INTRAVENOUS
  Filled 2018-07-16: qty 250

## 2018-07-16 MED ORDER — DIPHENHYDRAMINE HCL 25 MG PO CAPS
25.0000 mg | ORAL_CAPSULE | Freq: Once | ORAL | Status: AC
  Administered 2018-07-16: 25 mg via ORAL

## 2018-07-16 NOTE — Progress Notes (Signed)
Per Dr. Jana Hakim it is ok to treat today with Herceptin using ECHO from July 2019

## 2018-07-16 NOTE — Telephone Encounter (Signed)
Gave avs and calendar ° °

## 2018-07-16 NOTE — Patient Instructions (Signed)
[facility] Discharge Instructions for Patients Receiving Chemotherapy  Today you received the following chemotherapy agents Herceptin  To help prevent nausea and vomiting after your treatment, we encourage you to take your nausea medication as directed   If you develop nausea and vomiting that is not controlled by your nausea medication, call the clinic.   BELOW ARE SYMPTOMS THAT SHOULD BE REPORTED IMMEDIATELY:  *FEVER GREATER THAN 100.5 F  *CHILLS WITH OR WITHOUT FEVER  NAUSEA AND VOMITING THAT IS NOT CONTROLLED WITH YOUR NAUSEA MEDICATION  *UNUSUAL SHORTNESS OF BREATH  *UNUSUAL BRUISING OR BLEEDING  TENDERNESS IN MOUTH AND THROAT WITH OR WITHOUT PRESENCE OF ULCERS  *URINARY PROBLEMS  *BOWEL PROBLEMS  UNUSUAL RASH Items with * indicate a potential emergency and should be followed up as soon as possible.  Feel free to call the clinic should you have any questions or concerns. The clinic phone number is [phone].  Please show the CHEMO ALERT CARD at check-in to the Emergency Department and triage nurse.   

## 2018-08-06 ENCOUNTER — Telehealth: Payer: Self-pay | Admitting: Hematology and Oncology

## 2018-08-06 ENCOUNTER — Telehealth: Payer: Self-pay

## 2018-08-06 NOTE — Telephone Encounter (Signed)
Returned patient's call regarding changes in scheduled.  Nurse verified new appointment times and dates for 08/14/2018.  Patient voiced understanding and appreciation.  No further needs at this time.

## 2018-08-06 NOTE — Progress Notes (Signed)
Pt came in the lobby today to request a change on her appt for 12/18 (lab/fl/inf). Pt husband will be having new leg prosthesis fitted and cannot reschedule for 12/18. Pt requesting infusion appt change to 12/17 or 12/19. Sent stat scheduling message. Told pt that she will be getting a call soon to confirm new time/date.

## 2018-08-06 NOTE — Telephone Encounter (Signed)
R/s appt per 12/11 sch message - left message for patient with appt date and time

## 2018-08-13 ENCOUNTER — Ambulatory Visit: Payer: Medicare Other

## 2018-08-13 ENCOUNTER — Other Ambulatory Visit: Payer: Medicare Other

## 2018-08-14 ENCOUNTER — Inpatient Hospital Stay: Payer: Medicare Other

## 2018-08-14 ENCOUNTER — Inpatient Hospital Stay: Payer: Medicare Other | Attending: Oncology

## 2018-08-14 VITALS — BP 147/75 | HR 77 | Temp 98.2°F | Resp 18

## 2018-08-14 DIAGNOSIS — C50919 Malignant neoplasm of unspecified site of unspecified female breast: Secondary | ICD-10-CM

## 2018-08-14 DIAGNOSIS — I871 Compression of vein: Secondary | ICD-10-CM | POA: Diagnosis not present

## 2018-08-14 DIAGNOSIS — Z95828 Presence of other vascular implants and grafts: Secondary | ICD-10-CM | POA: Insufficient documentation

## 2018-08-14 DIAGNOSIS — C50911 Malignant neoplasm of unspecified site of right female breast: Secondary | ICD-10-CM

## 2018-08-14 DIAGNOSIS — Z7901 Long term (current) use of anticoagulants: Secondary | ICD-10-CM | POA: Insufficient documentation

## 2018-08-14 DIAGNOSIS — C50812 Malignant neoplasm of overlapping sites of left female breast: Secondary | ICD-10-CM | POA: Insufficient documentation

## 2018-08-14 DIAGNOSIS — Z5112 Encounter for antineoplastic immunotherapy: Secondary | ICD-10-CM | POA: Diagnosis not present

## 2018-08-14 DIAGNOSIS — C50912 Malignant neoplasm of unspecified site of left female breast: Secondary | ICD-10-CM

## 2018-08-14 LAB — CBC WITH DIFFERENTIAL/PLATELET
ABS IMMATURE GRANULOCYTES: 0.04 10*3/uL (ref 0.00–0.07)
BASOS ABS: 0 10*3/uL (ref 0.0–0.1)
Basophils Relative: 0 %
Eosinophils Absolute: 0.1 10*3/uL (ref 0.0–0.5)
Eosinophils Relative: 1 %
HEMATOCRIT: 35.5 % — AB (ref 36.0–46.0)
HEMOGLOBIN: 12.1 g/dL (ref 12.0–15.0)
IMMATURE GRANULOCYTES: 1 %
LYMPHS ABS: 2.1 10*3/uL (ref 0.7–4.0)
LYMPHS PCT: 24 %
MCH: 29 pg (ref 26.0–34.0)
MCHC: 34.1 g/dL (ref 30.0–36.0)
MCV: 85.1 fL (ref 80.0–100.0)
MONOS PCT: 9 %
Monocytes Absolute: 0.8 10*3/uL (ref 0.1–1.0)
NEUTROS PCT: 65 %
NRBC: 0 % (ref 0.0–0.2)
Neutro Abs: 5.6 10*3/uL (ref 1.7–7.7)
Platelets: 253 10*3/uL (ref 150–400)
RBC: 4.17 MIL/uL (ref 3.87–5.11)
RDW: 14.7 % (ref 11.5–15.5)
WBC: 8.6 10*3/uL (ref 4.0–10.5)

## 2018-08-14 LAB — COMPREHENSIVE METABOLIC PANEL
ALT: 12 U/L (ref 0–44)
ANION GAP: 10 (ref 5–15)
AST: 11 U/L — ABNORMAL LOW (ref 15–41)
Albumin: 3.8 g/dL (ref 3.5–5.0)
Alkaline Phosphatase: 64 U/L (ref 38–126)
BUN: 18 mg/dL (ref 8–23)
CHLORIDE: 105 mmol/L (ref 98–111)
CO2: 28 mmol/L (ref 22–32)
Calcium: 9.7 mg/dL (ref 8.9–10.3)
Creatinine, Ser: 0.9 mg/dL (ref 0.44–1.00)
GFR calc non Af Amer: >60 mL/min (ref >60)
GLUCOSE: 101 mg/dL — AB (ref 70–99)
Potassium: 4.2 mmol/L (ref 3.5–5.1)
SODIUM: 143 mmol/L (ref 135–145)
Total Bilirubin: 0.9 mg/dL (ref 0.3–1.2)
Total Protein: 7.8 g/dL (ref 6.5–8.1)

## 2018-08-14 LAB — PROTIME-INR
INR: 2.68
Prothrombin Time: 28.1 seconds — ABNORMAL HIGH (ref 11.4–15.2)

## 2018-08-14 MED ORDER — DIPHENHYDRAMINE HCL 25 MG PO CAPS
ORAL_CAPSULE | ORAL | Status: AC
  Filled 2018-08-14: qty 1

## 2018-08-14 MED ORDER — TRASTUZUMAB CHEMO 150 MG IV SOLR
798.0000 mg | Freq: Once | INTRAVENOUS | Status: AC
  Administered 2018-08-14: 798 mg via INTRAVENOUS
  Filled 2018-08-14: qty 38

## 2018-08-14 MED ORDER — LORAZEPAM 2 MG/ML IJ SOLN
INTRAMUSCULAR | Status: AC
  Filled 2018-08-14: qty 1

## 2018-08-14 MED ORDER — ACETAMINOPHEN 325 MG PO TABS
ORAL_TABLET | ORAL | Status: AC
  Filled 2018-08-14: qty 2

## 2018-08-14 MED ORDER — LORAZEPAM 2 MG/ML IJ SOLN
1.0000 mg | Freq: Once | INTRAMUSCULAR | Status: AC | PRN
  Administered 2018-08-14: 1 mg via INTRAVENOUS

## 2018-08-14 MED ORDER — SODIUM CHLORIDE 0.9 % IV SOLN
Freq: Once | INTRAVENOUS | Status: AC
  Administered 2018-08-14: 09:00:00 via INTRAVENOUS
  Filled 2018-08-14: qty 250

## 2018-08-14 MED ORDER — SODIUM CHLORIDE 0.9 % IJ SOLN
10.0000 mL | INTRAMUSCULAR | Status: DC | PRN
  Filled 2018-08-14: qty 10

## 2018-08-14 MED ORDER — SODIUM CHLORIDE 0.9% FLUSH
10.0000 mL | INTRAVENOUS | Status: AC | PRN
  Administered 2018-08-14: 10 mL
  Filled 2018-08-14: qty 10

## 2018-08-14 MED ORDER — ACETAMINOPHEN 325 MG PO TABS
650.0000 mg | ORAL_TABLET | Freq: Once | ORAL | Status: AC
  Administered 2018-08-14: 650 mg via ORAL

## 2018-08-14 MED ORDER — DIPHENHYDRAMINE HCL 25 MG PO CAPS
25.0000 mg | ORAL_CAPSULE | Freq: Once | ORAL | Status: AC
  Administered 2018-08-14: 25 mg via ORAL

## 2018-08-14 MED ORDER — HEPARIN SOD (PORK) LOCK FLUSH 100 UNIT/ML IV SOLN
500.0000 [IU] | Freq: Once | INTRAVENOUS | Status: AC | PRN
  Administered 2018-08-14: 500 [IU]
  Filled 2018-08-14: qty 5

## 2018-08-14 NOTE — Patient Instructions (Signed)
[facility] Discharge Instructions for Patients Receiving Chemotherapy  Today you received the following chemotherapy agents Herceptin  To help prevent nausea and vomiting after your treatment, we encourage you to take your nausea medication as directed   If you develop nausea and vomiting that is not controlled by your nausea medication, call the clinic.   BELOW ARE SYMPTOMS THAT SHOULD BE REPORTED IMMEDIATELY:  *FEVER GREATER THAN 100.5 F  *CHILLS WITH OR WITHOUT FEVER  NAUSEA AND VOMITING THAT IS NOT CONTROLLED WITH YOUR NAUSEA MEDICATION  *UNUSUAL SHORTNESS OF BREATH  *UNUSUAL BRUISING OR BLEEDING  TENDERNESS IN MOUTH AND THROAT WITH OR WITHOUT PRESENCE OF ULCERS  *URINARY PROBLEMS  *BOWEL PROBLEMS  UNUSUAL RASH Items with * indicate a potential emergency and should be followed up as soon as possible.  Feel free to call the clinic should you have any questions or concerns. The clinic phone number is [phone].  Please show the CHEMO ALERT CARD at check-in to the Emergency Department and triage nurse.   

## 2018-08-23 ENCOUNTER — Other Ambulatory Visit: Payer: Self-pay | Admitting: Oncology

## 2018-09-02 ENCOUNTER — Other Ambulatory Visit: Payer: Self-pay | Admitting: *Deleted

## 2018-09-02 DIAGNOSIS — C50919 Malignant neoplasm of unspecified site of unspecified female breast: Secondary | ICD-10-CM

## 2018-09-02 MED ORDER — TEMAZEPAM 30 MG PO CAPS: ORAL_CAPSULE | ORAL | 2 refills | Status: DC

## 2018-09-02 MED ORDER — ALPRAZOLAM 1 MG PO TABS: 1.0000 mg | ORAL_TABLET | Freq: Three times a day (TID) | ORAL | 1 refills | Status: DC | PRN

## 2018-09-10 ENCOUNTER — Other Ambulatory Visit: Payer: Self-pay | Admitting: Oncology

## 2018-09-10 ENCOUNTER — Inpatient Hospital Stay: Payer: Medicare Other

## 2018-09-10 ENCOUNTER — Inpatient Hospital Stay: Payer: Medicare Other | Attending: Oncology

## 2018-09-10 VITALS — BP 158/76 | HR 74 | Temp 98.3°F | Resp 18

## 2018-09-10 DIAGNOSIS — C50912 Malignant neoplasm of unspecified site of left female breast: Secondary | ICD-10-CM

## 2018-09-10 DIAGNOSIS — C50812 Malignant neoplasm of overlapping sites of left female breast: Secondary | ICD-10-CM | POA: Insufficient documentation

## 2018-09-10 DIAGNOSIS — C50311 Malignant neoplasm of lower-inner quadrant of right female breast: Secondary | ICD-10-CM

## 2018-09-10 DIAGNOSIS — C50312 Malignant neoplasm of lower-inner quadrant of left female breast: Secondary | ICD-10-CM

## 2018-09-10 DIAGNOSIS — Z5112 Encounter for antineoplastic immunotherapy: Secondary | ICD-10-CM | POA: Diagnosis not present

## 2018-09-10 DIAGNOSIS — I871 Compression of vein: Secondary | ICD-10-CM | POA: Insufficient documentation

## 2018-09-10 DIAGNOSIS — C50911 Malignant neoplasm of unspecified site of right female breast: Secondary | ICD-10-CM

## 2018-09-10 DIAGNOSIS — Z95828 Presence of other vascular implants and grafts: Secondary | ICD-10-CM

## 2018-09-10 DIAGNOSIS — C50919 Malignant neoplasm of unspecified site of unspecified female breast: Secondary | ICD-10-CM

## 2018-09-10 LAB — COMPREHENSIVE METABOLIC PANEL
ALT: 11 U/L (ref 0–44)
AST: 10 U/L — ABNORMAL LOW (ref 15–41)
Albumin: 3.8 g/dL (ref 3.5–5.0)
Alkaline Phosphatase: 70 U/L (ref 38–126)
Anion gap: 11 (ref 5–15)
BUN: 20 mg/dL (ref 8–23)
CHLORIDE: 109 mmol/L (ref 98–111)
CO2: 24 mmol/L (ref 22–32)
Calcium: 9.3 mg/dL (ref 8.9–10.3)
Creatinine, Ser: 0.83 mg/dL (ref 0.44–1.00)
GFR calc Af Amer: >60 mL/min (ref >60)
GFR calc non Af Amer: >60 mL/min (ref >60)
Glucose, Bld: 105 mg/dL — ABNORMAL HIGH (ref 70–99)
Potassium: 3.8 mmol/L (ref 3.5–5.1)
SODIUM: 144 mmol/L (ref 135–145)
Total Bilirubin: 0.8 mg/dL (ref 0.3–1.2)
Total Protein: 7.7 g/dL (ref 6.5–8.1)

## 2018-09-10 LAB — CBC WITH DIFFERENTIAL/PLATELET
Abs Immature Granulocytes: 0.03 10*3/uL (ref 0.00–0.07)
BASOS ABS: 0 10*3/uL (ref 0.0–0.1)
Basophils Relative: 0 %
Eosinophils Absolute: 0.1 10*3/uL (ref 0.0–0.5)
Eosinophils Relative: 1 %
HCT: 34.4 % — ABNORMAL LOW (ref 36.0–46.0)
Hemoglobin: 11.9 g/dL — ABNORMAL LOW (ref 12.0–15.0)
Immature Granulocytes: 0 %
Lymphocytes Relative: 25 %
Lymphs Abs: 2.4 10*3/uL (ref 0.7–4.0)
MCH: 29.3 pg (ref 26.0–34.0)
MCHC: 34.6 g/dL (ref 30.0–36.0)
MCV: 84.7 fL (ref 80.0–100.0)
Monocytes Absolute: 0.9 10*3/uL (ref 0.1–1.0)
Monocytes Relative: 9 %
NRBC: 0 % (ref 0.0–0.2)
Neutro Abs: 6.3 10*3/uL (ref 1.7–7.7)
Neutrophils Relative %: 65 %
Platelets: 267 10*3/uL (ref 150–400)
RBC: 4.06 MIL/uL (ref 3.87–5.11)
RDW: 14.5 % (ref 11.5–15.5)
WBC: 9.7 10*3/uL (ref 4.0–10.5)

## 2018-09-10 LAB — PROTIME-INR
INR: 1.97
Prothrombin Time: 22.2 seconds — ABNORMAL HIGH (ref 11.4–15.2)

## 2018-09-10 MED ORDER — LORAZEPAM 2 MG/ML IJ SOLN
INTRAMUSCULAR | Status: AC
  Filled 2018-09-10: qty 1

## 2018-09-10 MED ORDER — SODIUM CHLORIDE 0.9% FLUSH
10.0000 mL | INTRAVENOUS | Status: DC | PRN
  Administered 2018-09-10: 10 mL
  Filled 2018-09-10: qty 10

## 2018-09-10 MED ORDER — HEPARIN SOD (PORK) LOCK FLUSH 100 UNIT/ML IV SOLN
500.0000 [IU] | Freq: Once | INTRAVENOUS | Status: AC | PRN
  Administered 2018-09-10: 500 [IU]
  Filled 2018-09-10: qty 5

## 2018-09-10 MED ORDER — ACETAMINOPHEN 325 MG PO TABS
650.0000 mg | ORAL_TABLET | Freq: Once | ORAL | Status: AC
  Administered 2018-09-10: 650 mg via ORAL

## 2018-09-10 MED ORDER — SODIUM CHLORIDE 0.9 % IJ SOLN
10.0000 mL | INTRAMUSCULAR | Status: DC | PRN
  Administered 2018-09-10: 10 mL
  Filled 2018-09-10: qty 10

## 2018-09-10 MED ORDER — LORAZEPAM 2 MG/ML IJ SOLN
1.0000 mg | Freq: Once | INTRAMUSCULAR | Status: AC | PRN
  Administered 2018-09-10: 1 mg via INTRAVENOUS

## 2018-09-10 MED ORDER — DIPHENHYDRAMINE HCL 25 MG PO CAPS
ORAL_CAPSULE | ORAL | Status: AC
  Filled 2018-09-10: qty 1

## 2018-09-10 MED ORDER — SODIUM CHLORIDE 0.9 % IV SOLN
Freq: Once | INTRAVENOUS | Status: AC
  Administered 2018-09-10: 09:00:00 via INTRAVENOUS
  Filled 2018-09-10: qty 250

## 2018-09-10 MED ORDER — DIPHENHYDRAMINE HCL 25 MG PO CAPS
25.0000 mg | ORAL_CAPSULE | Freq: Once | ORAL | Status: AC
  Administered 2018-09-10: 25 mg via ORAL

## 2018-09-10 MED ORDER — ACETAMINOPHEN 325 MG PO TABS
ORAL_TABLET | ORAL | Status: AC
  Filled 2018-09-10: qty 2

## 2018-09-10 MED ORDER — TRASTUZUMAB CHEMO 150 MG IV SOLR
798.0000 mg | Freq: Once | INTRAVENOUS | Status: AC
  Administered 2018-09-10: 798 mg via INTRAVENOUS
  Filled 2018-09-10: qty 38

## 2018-09-10 NOTE — Patient Instructions (Signed)
[facility] Discharge Instructions for Patients Receiving Chemotherapy  Today you received the following chemotherapy agent: Herceptin  To help prevent nausea and vomiting after your treatment, we encourage you to take your nausea medication as directed.   If you develop nausea and vomiting that is not controlled by your nausea medication, call the clinic.   BELOW ARE SYMPTOMS THAT SHOULD BE REPORTED IMMEDIATELY:  *FEVER GREATER THAN 100.5 F  *CHILLS WITH OR WITHOUT FEVER  NAUSEA AND VOMITING THAT IS NOT CONTROLLED WITH YOUR NAUSEA MEDICATION  *UNUSUAL SHORTNESS OF BREATH  *UNUSUAL BRUISING OR BLEEDING  TENDERNESS IN MOUTH AND THROAT WITH OR WITHOUT PRESENCE OF ULCERS  *URINARY PROBLEMS  *BOWEL PROBLEMS  UNUSUAL RASH Items with * indicate a potential emergency and should be followed up as soon as possible.  Feel free to call the clinic should you have any questions or concerns. The clinic phone number is [phone].  Please show the CHEMO ALERT CARD at check-in to the Emergency Department and triage nurse.   

## 2018-09-15 ENCOUNTER — Telehealth: Payer: Self-pay | Admitting: Oncology

## 2018-09-15 NOTE — Telephone Encounter (Signed)
Called patient per 1/15 sch message is aware of appt date and time

## 2018-09-17 ENCOUNTER — Other Ambulatory Visit (HOSPITAL_COMMUNITY): Payer: Self-pay

## 2018-09-17 MED ORDER — CARVEDILOL 6.25 MG PO TABS: 6.2500 mg | ORAL_TABLET | Freq: Two times a day (BID) | ORAL | 3 refills | Status: DC

## 2018-09-19 ENCOUNTER — Other Ambulatory Visit (HOSPITAL_COMMUNITY): Payer: Self-pay

## 2018-09-30 ENCOUNTER — Encounter (HOSPITAL_COMMUNITY): Payer: Self-pay | Admitting: Cardiology

## 2018-09-30 ENCOUNTER — Ambulatory Visit (HOSPITAL_COMMUNITY)
Admission: RE | Admit: 2018-09-30 | Discharge: 2018-09-30 | Disposition: A | Discharge status: Home / Self Care | Payer: Medicare Other | Source: Ambulatory Visit | Attending: Cardiology | Admitting: Cardiology

## 2018-09-30 ENCOUNTER — Ambulatory Visit (HOSPITAL_BASED_OUTPATIENT_CLINIC_OR_DEPARTMENT_OTHER)
Admission: RE | Admit: 2018-09-30 | Discharge: 2018-09-30 | Disposition: A | Discharge status: Home / Self Care | Payer: Medicare Other | Source: Ambulatory Visit | Attending: Cardiology | Admitting: Cardiology

## 2018-09-30 ENCOUNTER — Other Ambulatory Visit: Payer: Self-pay

## 2018-09-30 ENCOUNTER — Other Ambulatory Visit (HOSPITAL_COMMUNITY): Payer: Self-pay

## 2018-09-30 VITALS — BP 150/88 | HR 89 | Wt 277.0 lb

## 2018-09-30 DIAGNOSIS — G629 Polyneuropathy, unspecified: Secondary | ICD-10-CM | POA: Insufficient documentation

## 2018-09-30 DIAGNOSIS — Z853 Personal history of malignant neoplasm of breast: Secondary | ICD-10-CM | POA: Diagnosis not present

## 2018-09-30 DIAGNOSIS — Z79899 Other long term (current) drug therapy: Secondary | ICD-10-CM | POA: Diagnosis not present

## 2018-09-30 DIAGNOSIS — E669 Obesity, unspecified: Secondary | ICD-10-CM | POA: Insufficient documentation

## 2018-09-30 DIAGNOSIS — I5032 Chronic diastolic (congestive) heart failure: Secondary | ICD-10-CM

## 2018-09-30 DIAGNOSIS — Z8505 Personal history of malignant neoplasm of liver: Secondary | ICD-10-CM | POA: Insufficient documentation

## 2018-09-30 DIAGNOSIS — I110 Hypertensive heart disease with heart failure: Secondary | ICD-10-CM | POA: Diagnosis not present

## 2018-09-30 DIAGNOSIS — Z85118 Personal history of other malignant neoplasm of bronchus and lung: Secondary | ICD-10-CM | POA: Insufficient documentation

## 2018-09-30 DIAGNOSIS — C50919 Malignant neoplasm of unspecified site of unspecified female breast: Secondary | ICD-10-CM | POA: Diagnosis not present

## 2018-09-30 DIAGNOSIS — Z9013 Acquired absence of bilateral breasts and nipples: Secondary | ICD-10-CM | POA: Insufficient documentation

## 2018-09-30 DIAGNOSIS — Z7901 Long term (current) use of anticoagulants: Secondary | ICD-10-CM | POA: Insufficient documentation

## 2018-09-30 DIAGNOSIS — I871 Compression of vein: Secondary | ICD-10-CM | POA: Insufficient documentation

## 2018-09-30 MED ORDER — CARVEDILOL 6.25 MG PO TABS: 6.2500 mg | ORAL_TABLET | Freq: Two times a day (BID) | ORAL | 3 refills | Status: DC

## 2018-09-30 NOTE — Patient Instructions (Signed)
Your physician recommends that you schedule a follow-up appointment in: 6 months with an ECHO  Your physician has requested that you have an echocardiogram. Echocardiography is a painless test that uses sound waves to create images of your heart. It provides your doctor with information about the size and shape of your heart and how well your heart's chambers and valves are working. This procedure takes approximately one hour. There are no restrictions for this procedure.

## 2018-09-30 NOTE — Progress Notes (Signed)
Advanced Heart Failure Clinic Note   Patient ID: [name], female   DOB: 04-Dec-1950, 68 y.o.   MRN: [id number] Oncologist: Dr Jana Hakim  HPI: [name] is a 68 y.o. with a history of metastatic HER-2 positive breast carcinoma originally diagnosed September 2004. Started in R breast. Underwent neo-adjuvant chemo. During this time developed L breast CA. Underwent bilateral mastectomy. Lymph nodes +. 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   She received a total of 6 cycles of Taxotere/Carbo/Herceptin, completed in April 2005, after which she began single agent Herceptin, given every 4 weeks now. Plan to continue on Herceptin indefinitely.   She returns for cardio-oncology followup.  BP high today but has been out of Coreg.  No dyspnea walking on flat ground.  Weight down 9 lbs. No chest pain.  No orthopnea/PND.  Overall feels good.   Labs 11/15: K 4, creatinine 1.0   Labs 1/16: K 4.1, creatinine 1.0 Labs 8/16: K 4.3, creatinine 0.9 Labs 11/16: K 3.9, creatinine 0.9 Labs 5/17: K 3.9, creatinine 0.68 Labs 5/18: K 4, creatinine 0.65 Labs 1/19: K 4.1, creatinine 1.23 Labs 7/19: K 3.9, creatinine 1.11 Labs 1/20: K 3.8, creatinine 0.83  ECG (personally reviewed): NSR, LVH  04/23/12 ECHO EF 60-65% Lateral s' 8.9 cm/s  07/30/12 ECHO EF 60-65% Lateral s' 8.3 cm/s  09/11/12 ECHO EF 60-65% Lateral s' 10.3 cm/s  12/22/12 ECHO 55-60% Lateral S' 9.8 RV mildly dilated  07/01/13 ECHO 55-60%, lateral s' 9.79, mild RV dilation, grade II DD 3/15 ECHO 55%, mild MR, lateral s' 9.6, GLS -19.2% 03/02/14 ECHO EF 55-60% Lateral S' 9.4 GLS - 21.9  11/15 ECHO EF 60-65%, lateral S' 6.8, GLS -17.2%, mild RV dilation with normal systolic function, mild MR.  2/16 ECHO EF 60-65%, lateral S' 14.4, GLS -68.1%, normal diastolic function, mild RV dilation with normal RV systolic function.  8/16 ECHO EF 60-65%, mild LVH, normal RV size and systolic function, lateral s'  13.2, GLS -17%.  11/16 ECHO EF 60-65% Lateral S' 10.2, GLS -20.2% 5/17 ECHO EF 60-65%, mild LVH, lateral s' 12.2, GLS -20.8%, grade II diastolic dysfunction, normal RV. 9/17 ECHO EF 15-72%, normal diastolic function, GLS -62.0% 12/17 ECHO EF 55-60%, moderate diastolic dysfunction, GLS -20.1%, normal RV size and systolic function 3/55 ECHO EF 55-60%, GLS -97.4%, normal diastolic function, normal RV size and systolic function.  1/19 ECHO EF 55-60%, GLS -16.3%, normal diastolic function, RV normal size and systolic function.  7/19 ECHO EF 60-65%, GLS -22.1%.  1/20 ECHO EF 84-53%, normal diastolic function, normal RV, strain not done.    ROS: All systems negative except as listed in HPI, PMH and Problem List.  Past Medical History:  Diagnosis Date  . Breast cancer ([place])    mets to liver and lung  . Breast cancer metastasized to multiple sites ([place]) 02/26/2013  . History of chemotherapy Feb. 2006   taxotere/herceptin/carboplatin  . Hypertension   . Neuropathy   . Radiation 07/31/2006   left upper chest  . Radiation 06/17/2006-06/27/2006   6480 cGy bilat. chest wall  . SVC syndrome   . Thrombosis     Current Outpatient Medications  Medication Sig Dispense Refill  . acetaminophen (TYLENOL) 500 MG tablet Take 1,000 mg by mouth every 6 (six) hours as needed for mild pain or fever.     Marland Kitchen albuterol (PROVENTIL HFA;VENTOLIN HFA) 108 (90 BASE) MCG/ACT inhaler Inhale 2 puffs into the lungs every 6 (six) hours as needed for  wheezing. 1 Inhaler 5  . ALPRAZolam (XANAX) 1 MG tablet Take 1 tablet (1 mg total) by mouth 3 (three) times daily as needed. for anxiety 60 tablet 1  . amLODipine (NORVASC) 10 MG tablet Take 10 mg by mouth every morning.    . baclofen (LIORESAL) 10 MG tablet TAKE 1 TABLET BY MOUTH THREE TIMES DAILY AS NEEDED FOR MUSCLE SPASMS 270 tablet 0  . diclofenac sodium (VOLTAREN) 1 % GEL Apply 2 g topically daily as needed (for pain). Apply to knees and shoulders 100 g 6  . furosemide  (LASIX) 40 MG tablet Take 1 tablet (40 mg total) by mouth daily. (Patient taking differently: Take 40 mg by mouth daily. Pt states taking 1-2x week) 30 tablet 3  . gabapentin (NEURONTIN) 300 MG capsule TAKE 2 CAPSULES(600 MG) BY MOUTH THREE TIMES DAILY 540 capsule 0  . losartan (COZAAR) 100 MG tablet Take 100 mg by mouth every morning.    . potassium chloride (K-DUR,KLOR-CON) 20 MEQ tablet Take 1 tablet (20 mEq total) by mouth daily. 30 tablet 3  . spironolactone (ALDACTONE) 25 MG tablet Take 1 tablet (25 mg total) by mouth daily. NEEDS OFFICE VISIT 30 tablet 5  . temazepam (RESTORIL) 30 MG capsule TAKE ONE CAPSULE BY MOUTH EVERY NIGHT AT BEDTIME AS NEEDED FOR SLEEP 30 capsule 2  . warfarin (COUMADIN) 5 MG tablet Take 5 mg by mouth daily.    . carvedilol (COREG) 6.25 MG tablet Take 1 tablet (6.25 mg total) by mouth 2 (two) times daily. 60 tablet 3   No current facility-administered medications for this encounter.    Facility-Administered Medications Ordered in Other Encounters  Medication Dose Route Frequency Provider Last Rate Last Dose  . sodium chloride flush (NS) 0.9 % injection 10 mL  10 mL Intravenous PRN Magrinat, Virgie Dad, MD   10 mL at 12/15/15 1200  . sodium chloride flush (NS) 0.9 % injection 10 mL  10 mL Intracatheter PRN Magrinat, Virgie Dad, MD   10 mL at 08/14/18 1116    Vitals:   09/30/18 1216  BP: (!) 150/88  Pulse: 89  SpO2: 94%  Weight: 125.6 kg (277 lb)    PHYSICAL EXAM: General: NAD Neck: No JVD, no thyromegaly or thyroid nodule.  Lungs: Clear to auscultation bilaterally with normal respiratory effort. CV: Nondisplaced PMI.  Heart regular S1/S2, no S3/S4, no murmur.  Trace ankle edema.  No carotid bruit.  Normal pedal pulses.  Abdomen: Soft, nontender, no hepatosplenomegaly, no distention.  Skin: Intact without lesions or rashes.  Neurologic: Alert and oriented x 3.  Psych: Normal affect. Extremities: No clubbing or cyanosis.  HEENT: Normal.   ASSESSMENT &  PLAN: 1) L Breast Cancer s/p bilateral mastectomies:  Symptomatically stable. Echo reviewed today, EF stable but strain not done.  She will be continuing Herceptin indefinitely, now getting every 4 wks.   - Repeat echo in 6 months.   2) Suspected OSA: She has not wanted to do a sleep study.    3) HTN: BP high today but has been off Coreg.  - Restart Coreg 6.25 mg bid.    4) Chronic diastolic CHF: She does not appear volume overloaded.  - Continue Lasix twice a week.  5) SVC syndrome: Continue warfarin.   Followup 6 months with echo.   Loralie Champagne 09/30/2018

## 2018-09-30 NOTE — Progress Notes (Signed)
  Echocardiogram 2D Echocardiogram has been performed.  Saman Giddens L Androw 09/30/2018, 12:18 PM

## 2018-10-07 NOTE — Progress Notes (Signed)
[name]  [phone] [phone]    ID: HAVANNAH STREAT   DOB: September 12, 1950  MR#: [id number]  PJA#:[id number]   Patient Care Team: Yolanda Kiel, MD as PCP - General (Internal Medicine) [name]., MD as Consulting Physician (Pain Medicine) Yolanda Salm, MD as Referring Physician (Orthopedic Surgery) Yolanda Graff, DO as Consulting Physician (Orthopedic Surgery) Yolanda Dresser, MD as Consulting Physician (Cardiology) Davis, Yolanda Pascal, MD as Consulting Physician (Cardiology) Yolanda Gibbs, MD (Radiology) [name], Yolanda Islam, MD as Consulting Physician (Infectious Diseases) Yolanda Pray, MD as Consulting Physician (Radiation Oncology) Davis, Yolanda Dad, MD as Consulting Physician (Oncology)   CHIEF COMPLAINT: Metastatic HER-2 positive, estrogen receptor negative breast cancer  CURRENT TREATMENT: Trastuzumab every 28 days; lifelong warfarin   INTERVAL HISTORY: [name] returns today for follow-up and treatment of her estrogen receptor negative, but HER2 amplified breast cancer.  She continues on trastuzumab. She tolerates this well and without any noticeable side effects.    She also continues on warfarin. She tolerates this well and without any noticeable side effects.     [name]'s last echocardiogram on 09/30/2018, showed an ejection fraction in the 60% - 65% range.   Her last restaging PET was on 07/09/2018 showing no evidence of active malignancy.   Since her last visit here, she has not undergone any additional studies.     REVIEW OF SYSTEMS: [name] has had chest spasms and a "prickly or tingly" feeling across her chest and under her arms. She also has osteoarthritis. For activity, she doesn't have a formal exercise routine, but she is keeping up with her yard work, taking care of her husband, and staying busy. The patient denies unusual headaches, visual changes, nausea, vomiting, or dizziness. There has been  no unusual cough, phlegm production, or pleurisy. This been no change in bowel or bladder habits. The patient denies unexplained fatigue or unexplained weight loss, bleeding, rash, or fever. A detailed review of systems was otherwise noncontributory.    BREAST CANCER HISTORY:   From the earlier summary:  [name] is 68 years old [name] female.  This woman has been in good health all of her life.  She noted a swelling and discomfort in her right breast in June 2004.  She was seen in the Emergency Room in [place] and was treated for mastitis.  She was treated for a number of months with mastitis and the swelling did not get better. She was given hydrocodone and Cipro.  Finally, the swelling did get better and ultimately the nipple became retracted and she noticed some dimpling in her skin. She had a mammogram in July of 2004 in [place] with subsequent mammogram on May 27, 2003, by Dr. Isaiah Davis.  Mammogram done on September 30 showed marked increased density in the left breast.  Biopsy was performed the same day.  It was noted at the 12 o'clock position, deep in the breast was a focal hypoechoic mass, at least 3.5 cm in diameter.  Biopsy did in fact show invasive in situ mammary carcinoma. This was felt to be both at least intermediate, high grade.  No definite lymphovascular invasion was identified. ER and PR negative, Her2 testing positive. [name] continues to have pain in her breast.  She continues to take hydrocodone a number of times a day.  She has been seen by Dr. Rosana Hoes, who felt that neoadjuvant chemotherapy would be required.    Initial staging studies showed evidence of liver and lung mets.  Patient also has evidence of bone lesions. Patient started neoadjuvant chemotherapy, Taxotere/Carbo/Herceptin in October 2004.   Patient had a CT scan in December 2004 which demonstrated extensive clot in the SVC innominate vein, bilateral jugular vein and  She was started on anticoagulation  therapy. She received a total of 6 cycles of Taxotere/Carbo/Herceptin, completed in April 2005."  Patient has been on  trastuzumab continued indefinitely; has also received lapatinib and capecitabine for variable intervals in 2007-2008. Most recent echo 12/01/2013 showed an ejection fraction of 55%. She is status post bilateral mastectomies with bilateral axillary lymph node dissection 12/07/2004, showing (a) on the right, a mypT1c ypN1 invasive ductal carcinoma, grade 3, estrogen and progesterone receptor negative, HER-2 positive, with an MIB-1 of 31% (b) on the left, ypT2 ypN1 invasive ductal carcinoma, grade 2, estrogen and progesterone receptor negative, HER-2 positive, with an MIB-1 of 35%. She is Status post radiation June through July of 2006, to the right chest wall, left chest wall, bilateral supraclavicular fossae, and bilateral axillary boosts; with additional radiation to the right and left chest walls and the central chest wall completed November of 2007. She is status post ixempra x9 completed August of 2009. 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 Patient has chronic pain, with negative PET scan 08/24/2013 (no evidence of active cancer). On Neurontin and Tramadol therapy.  Her subsequent history is as detailed below  PAST MEDICAL HISTORY: Past Medical History:  Diagnosis Date  . Breast cancer ([place])    mets to liver and lung  . Breast cancer metastasized to multiple sites ([place]) 02/26/2013  . History of chemotherapy Feb. 2006   taxotere/herceptin/carboplatin  . Hypertension   . Neuropathy   . Radiation 07/31/2006   left upper chest  . Radiation 06/17/2006-06/27/2006   6480 cGy bilat. chest wall  . SVC syndrome   . Thrombosis     PAST SURGICAL HISTORY: Past Surgical History:  Procedure Laterality Date  . ANKLE SURGERY    . BACK SURGERY    . CHOLECYSTECTOMY  1989  . MASTECTOMY Bilateral   .  PERIPHERALLY INSERTED CENTRAL CATHETER INSERTION    . TUBAL LIGATION  1986    FAMILY HISTORY Family History  Problem Relation Age of Onset  . Heart failure Father   . Cancer Father        Prostate cancer  . Heart failure Brother   . Cancer Brother        Prostate cancer  . Diabetes Maternal Aunt    She had three brothers, one died of gunshot wound, one of complications of diabetes mellitus and one of myocardial infarction.  She has no sisters.  Mother died of complications of brain metastasis in 64.  Father has had a myocardial infarction in 1999.  No history of breast or ovarian cancer in the family.     GYNECOLOGIC HISTORY:   Menarche at age 26.  Gravida 3, para 3.  First live birth at age 3.  No history of breast feeding. No history of hormonal replacement therapy.    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 He used to work as a Art gallery manager, but is now retired and disabled. The patient has three children, Monette who lives in [place] and works as a Hydrographic surveyor, Financial risk analyst who lives in [place] and  works as a Administrator, Heritage manager who lives in [place] and also works as a Hydrographic surveyor. The patient has 14 grandchildren and 4 great-grandchildren. She attends a [employer]. Very involved with school kids.  ADVANCED DIRECTIVES:  Not in place   HEALTH MAINTENANCE: (Updated 06/12/2013) Social History   Tobacco Use  . Smoking status: Never Smoker  . Smokeless tobacco: Never Used  Substance Use Topics  . Alcohol use: Yes    Comment: occasional  . Drug use: No     Colonoscopy: Never and "I don't want one"  PAP:  1987  Bone density:  Never   Lipid panel:  Not on file    Allergies  Allergen Reactions  . Penicillins Hives    PATIENT HAS TOLERATED CEPHALOSPORINGS  . Adhesive [Tape] Other (See Comments)    Tears  skin     Current Outpatient Medications  Medication Sig Dispense Refill  . acetaminophen (TYLENOL) 500 MG tablet Take 1,000 mg by mouth every 6 (six) hours as needed for mild pain or fever.     Marland Kitchen albuterol (PROVENTIL HFA;VENTOLIN HFA) 108 (90 BASE) MCG/ACT inhaler Inhale 2 puffs into the lungs every 6 (six) hours as needed for wheezing. 1 Inhaler 5  . ALPRAZolam (XANAX) 1 MG tablet Take 1 tablet (1 mg total) by mouth 3 (three) times daily as needed. for anxiety 60 tablet 1  . amLODipine (NORVASC) 10 MG tablet Take 10 mg by mouth every morning.    . baclofen (LIORESAL) 10 MG tablet TAKE 1 TABLET BY MOUTH THREE TIMES DAILY AS NEEDED FOR MUSCLE SPASMS 270 tablet 0  . carvedilol (COREG) 6.25 MG tablet Take 1 tablet (6.25 mg total) by mouth 2 (two) times daily. 60 tablet 3  . diclofenac sodium (VOLTAREN) 1 % GEL Apply 2 g topically daily as needed (for pain). Apply to knees and shoulders 100 g 6  . furosemide (LASIX) 40 MG tablet Take 1 tablet (40 mg total) by mouth daily. (Patient taking differently: Take 40 mg by mouth daily. Pt states taking 1-2x week) 30 tablet 3  . gabapentin (NEURONTIN) 300 MG capsule TAKE 2 CAPSULES(600 MG) BY MOUTH THREE TIMES DAILY 540 capsule 0  . losartan (COZAAR) 100 MG tablet Take 100 mg by mouth every morning.    . potassium chloride (K-DUR,KLOR-CON) 20 MEQ tablet Take 1 tablet (20 mEq total) by mouth daily. 30 tablet 3  . spironolactone (ALDACTONE) 25 MG tablet Take 1 tablet (25 mg total) by mouth daily. NEEDS OFFICE VISIT 30 tablet 5  . temazepam (RESTORIL) 30 MG capsule TAKE ONE CAPSULE BY MOUTH EVERY NIGHT AT BEDTIME AS NEEDED FOR SLEEP 30 capsule 2  . warfarin (COUMADIN) 5 MG tablet Take 5 mg by mouth daily.     No current facility-administered medications for this visit.    Facility-Administered Medications Ordered in Other Visits  Medication Dose Route Frequency Provider Last Rate Last Dose  . sodium chloride flush (NS) 0.9 % injection 10 mL  10 mL  Intravenous PRN Davis, Yolanda Dad, MD   10 mL at 12/15/15 1200  . sodium chloride flush (NS) 0.9 % injection 10 mL  10 mL Intracatheter PRN Davis, Yolanda Dad, MD   10 mL at 08/14/18 1116    OBJECTIVE: Morbidly obese African-American woman in no acute distress   Vitals:   10/08/18 0850  BP: 137/73  Pulse: 63  Resp: 18  Temp: 98.6 F (37 C)  SpO2: 100%     Body mass index is  47.74 kg/m.    ECOG FS: 1 Filed Weights   10/08/18 0850  Weight: 269 lb 8 oz (122.2 kg)   Sclerae unicteric, pupils round and equal Oropharynx clear and moist No cervical or supraclavicular adenopathy Lungs no rales or rhonchi Heart regular rate and rhythm Abd soft, nontender, positive bowel sounds MSK no focal spinal tenderness, no upper extremity lymphedema Neuro: nonfocal, well oriented, appropriate affect Breasts: Is post bilateral mastectomies.  No evidence of chest wall recurrence.  Both axillae are benign.  LAB RESULTS: Lab Results  Component Value Date   WBC 8.4 10/08/2018   NEUTROABS 5.7 10/08/2018   HGB 10.7 (L) 10/08/2018   HCT 31.4 (L) 10/08/2018   MCV 85.1 10/08/2018   PLT 243 10/08/2018      Chemistry      Component Value Date/Time   NA 144 09/10/2018 0809   NA 143 08/14/2017 0811   K 3.8 09/10/2018 0809   K 3.8 08/14/2017 0811   CL 109 09/10/2018 0809   CL 104 01/30/2013 0850   CO2 24 09/10/2018 0809   CO2 24 08/14/2017 0811   BUN 20 09/10/2018 0809   BUN 14.9 08/14/2017 0811   CREATININE 0.83 09/10/2018 0809   CREATININE 0.9 08/14/2017 0811      Component Value Date/Time   CALCIUM 9.3 09/10/2018 0809   CALCIUM 9.4 08/14/2017 0811   ALKPHOS 70 09/10/2018 0809   ALKPHOS 73 08/14/2017 0811   AST 10 (L) 09/10/2018 0809   AST 10 08/14/2017 0811   ALT 11 09/10/2018 0809   ALT 10 08/14/2017 0811   BILITOT 0.8 09/10/2018 0809   BILITOT 0.62 08/14/2017 0811     STUDIES: No results found.   ASSESSMENT: 68 y.o.  [place], [place], woman  (1)  with a history  of inflammatory right breast cancer metastatic at presentation September 2004 with involvement of liver and bone, HER-2 positive, estrogen and progesterone receptor negative  (2) treated with carboplatin, docetaxel and Herceptin x 6 completed April 2005  (3) trastuzumab continued indefinitely;   (a) has also received lapatinib and capecitabine for variable intervals in 2007-2008.  (b) Every 6 month echo: 09/24/2017 showed an ejection fraction in the 55-60%  (c) echocardiogram 03/07/2018 showed an ejection fraction in the 55-60%  (d) cardiogram 09/30/2018 showed ejection fraction in the 60-65% range.  (4) status post bilateral mastectomies with bilateral axillary lymph node dissection 12/07/2004, showing  (a) on the right, a mypT1c ypN1 invasive ductal carcinoma, grade 3, estrogen and progesterone receptor negative, HER-2 positive, with an MIB-1 of 31%  (b) on the left, ypT2 ypN1 invasive ductal carcinoma, grade 2, estrogen and progesterone receptor negative, HER-2 positive, with an MIB-1 of 35%.  (5)  Status post radiation June through July of 2006, to the right chest wall, left chest wall, bilateral supraclavicular fossae, and bilateral axillary boosts; with additional radiation to the right and left chest walls and the central chest wall completed November of 2007  (6) status post Ixempra x9 completed August of 2009.  (7) history of superior vena caval syndrome, on life long anticoagulation   (8)  History of chemotherapy-induced neuropathy.   (9)  chronic pain, with negative PET scan 08/24/2013 (no evidence of active cancer). On Neurontin and Tramadol  (a) repeat PET scan December 2016 again negative.  (b) Repeat PET scan 04/23/2017 shows no active malignancy  (c) PET scan 07/09/2018 shows no evidence of active malignancy.  (10) right upper extremity cellulitis, no bacteremia; treated with cephalexin /doxycycline for  2 weeks, with resolution  (11 subacute fracture right humerus,  nondisplaced, status post fall 03/27/2017   PLAN: [place] is now 16 years out from definitive diagnosis of metastatic breast cancer.  There is no evidence of disease activity.  This is very favorable.  She continues to tolerate trastuzumab remarkably well.  Line is to continue that indefinitely.  We are obtaining echocardiograms every 6 months.  She has not had a bone density and I am setting that up to be done late April at [facility].  She will be due for a PET scan around that time and I have also entered that order  She will continue the warfarin at 5 mg daily.  She will let us know if she has any bruising or bleeding complications.  Otherwise she will see me again in May.  She knows to call for any other issues that may develop before that visit.   Davis, Yolanda Dad, MD  10/08/18 9:16 AM Medical Oncology and Hematology [facility] [address] [place], [place] [address] Tel. [phone]    Fax. [phone]  I, Jacqualyn Posey am acting as a Education administrator for Chauncey Cruel, MD.   I, Lurline Del MD, have reviewed the above documentation for accuracy and completeness, and I agree with the above.

## 2018-10-08 ENCOUNTER — Inpatient Hospital Stay: Payer: Medicare Other | Attending: Oncology

## 2018-10-08 ENCOUNTER — Telehealth: Payer: Self-pay | Admitting: Oncology

## 2018-10-08 ENCOUNTER — Inpatient Hospital Stay: Payer: Medicare Other

## 2018-10-08 ENCOUNTER — Other Ambulatory Visit: Payer: Self-pay | Admitting: Oncology

## 2018-10-08 ENCOUNTER — Inpatient Hospital Stay (HOSPITAL_BASED_OUTPATIENT_CLINIC_OR_DEPARTMENT_OTHER): Payer: Medicare Other | Admitting: Oncology

## 2018-10-08 ENCOUNTER — Encounter: Payer: Self-pay | Admitting: *Deleted

## 2018-10-08 VITALS — BP 137/73 | HR 63 | Temp 98.6°F | Resp 18 | Ht 63.0 in | Wt 269.5 lb

## 2018-10-08 DIAGNOSIS — C787 Secondary malignant neoplasm of liver and intrahepatic bile duct: Secondary | ICD-10-CM

## 2018-10-08 DIAGNOSIS — C50812 Malignant neoplasm of overlapping sites of left female breast: Secondary | ICD-10-CM

## 2018-10-08 DIAGNOSIS — C50311 Malignant neoplasm of lower-inner quadrant of right female breast: Secondary | ICD-10-CM

## 2018-10-08 DIAGNOSIS — C50919 Malignant neoplasm of unspecified site of unspecified female breast: Secondary | ICD-10-CM

## 2018-10-08 DIAGNOSIS — C7951 Secondary malignant neoplasm of bone: Secondary | ICD-10-CM | POA: Diagnosis not present

## 2018-10-08 DIAGNOSIS — C50312 Malignant neoplasm of lower-inner quadrant of left female breast: Secondary | ICD-10-CM

## 2018-10-08 DIAGNOSIS — Z171 Estrogen receptor negative status [ER-]: Secondary | ICD-10-CM | POA: Diagnosis not present

## 2018-10-08 DIAGNOSIS — C50911 Malignant neoplasm of unspecified site of right female breast: Secondary | ICD-10-CM

## 2018-10-08 DIAGNOSIS — C50912 Malignant neoplasm of unspecified site of left female breast: Secondary | ICD-10-CM

## 2018-10-08 DIAGNOSIS — Z5112 Encounter for antineoplastic immunotherapy: Secondary | ICD-10-CM | POA: Diagnosis not present

## 2018-10-08 DIAGNOSIS — Z95828 Presence of other vascular implants and grafts: Secondary | ICD-10-CM

## 2018-10-08 LAB — COMPREHENSIVE METABOLIC PANEL
ALT: 7 U/L (ref 0–44)
ANION GAP: 10 (ref 5–15)
AST: 11 U/L — ABNORMAL LOW (ref 15–41)
Albumin: 3.5 g/dL (ref 3.5–5.0)
Alkaline Phosphatase: 69 U/L (ref 38–126)
BUN: 20 mg/dL (ref 8–23)
CO2: 25 mmol/L (ref 22–32)
Calcium: 9.3 mg/dL (ref 8.9–10.3)
Chloride: 107 mmol/L (ref 98–111)
Creatinine, Ser: 0.91 mg/dL (ref 0.44–1.00)
GFR calc non Af Amer: >60 mL/min (ref >60)
Glucose, Bld: 95 mg/dL (ref 70–99)
Potassium: 3.9 mmol/L (ref 3.5–5.1)
Sodium: 142 mmol/L (ref 135–145)
Total Bilirubin: 0.6 mg/dL (ref 0.3–1.2)
Total Protein: 7.2 g/dL (ref 6.5–8.1)

## 2018-10-08 LAB — PROTIME-INR
INR: 4.28
Prothrombin Time: 40.5 seconds — ABNORMAL HIGH (ref 11.4–15.2)

## 2018-10-08 LAB — CBC WITH DIFFERENTIAL/PLATELET
Abs Immature Granulocytes: 0.03 10*3/uL (ref 0.00–0.07)
Basophils Absolute: 0 10*3/uL (ref 0.0–0.1)
Basophils Relative: 0 %
Eosinophils Absolute: 0.1 10*3/uL (ref 0.0–0.5)
Eosinophils Relative: 1 %
HCT: 31.4 % — ABNORMAL LOW (ref 36.0–46.0)
Hemoglobin: 10.7 g/dL — ABNORMAL LOW (ref 12.0–15.0)
Immature Granulocytes: 0 %
Lymphocytes Relative: 23 %
Lymphs Abs: 1.9 10*3/uL (ref 0.7–4.0)
MCH: 29 pg (ref 26.0–34.0)
MCHC: 34.1 g/dL (ref 30.0–36.0)
MCV: 85.1 fL (ref 80.0–100.0)
Monocytes Absolute: 0.6 10*3/uL (ref 0.1–1.0)
Monocytes Relative: 7 %
Neutro Abs: 5.7 10*3/uL (ref 1.7–7.7)
Neutrophils Relative %: 69 %
Platelets: 243 10*3/uL (ref 150–400)
RBC: 3.69 MIL/uL — ABNORMAL LOW (ref 3.87–5.11)
RDW: 14.3 % (ref 11.5–15.5)
WBC: 8.4 10*3/uL (ref 4.0–10.5)
nRBC: 0 % (ref 0.0–0.2)

## 2018-10-08 MED ORDER — SODIUM CHLORIDE 0.9% FLUSH
10.0000 mL | INTRAVENOUS | Status: DC | PRN
  Administered 2018-10-08: 10 mL
  Filled 2018-10-08: qty 10

## 2018-10-08 MED ORDER — SODIUM CHLORIDE 0.9 % IJ SOLN: 10.0000 mL | INTRAMUSCULAR | Status: DC | PRN

## 2018-10-08 MED ORDER — ACETAMINOPHEN 325 MG PO TABS
650.0000 mg | ORAL_TABLET | Freq: Once | ORAL | Status: AC
  Administered 2018-10-08: 650 mg via ORAL

## 2018-10-08 MED ORDER — LORAZEPAM 2 MG/ML IJ SOLN
1.0000 mg | Freq: Once | INTRAMUSCULAR | Status: AC | PRN
  Administered 2018-10-08: 1 mg via INTRAVENOUS

## 2018-10-08 MED ORDER — TRASTUZUMAB CHEMO 150 MG IV SOLR
798.0000 mg | Freq: Once | INTRAVENOUS | Status: AC
  Administered 2018-10-08: 798 mg via INTRAVENOUS
  Filled 2018-10-08: qty 38

## 2018-10-08 MED ORDER — HEPARIN SOD (PORK) LOCK FLUSH 100 UNIT/ML IV SOLN
500.0000 [IU] | Freq: Once | INTRAVENOUS | Status: AC | PRN
  Administered 2018-10-08: 500 [IU]
  Filled 2018-10-08: qty 5

## 2018-10-08 MED ORDER — DIPHENHYDRAMINE HCL 25 MG PO CAPS
25.0000 mg | ORAL_CAPSULE | Freq: Once | ORAL | Status: AC
  Administered 2018-10-08: 25 mg via ORAL

## 2018-10-08 MED ORDER — SODIUM CHLORIDE 0.9 % IV SOLN
Freq: Once | INTRAVENOUS | Status: AC
  Administered 2018-10-08: 10:00:00 via INTRAVENOUS
  Filled 2018-10-08: qty 250

## 2018-10-08 MED ORDER — DIPHENHYDRAMINE HCL 25 MG PO CAPS
ORAL_CAPSULE | ORAL | Status: AC
  Filled 2018-10-08: qty 1

## 2018-10-08 MED ORDER — ACETAMINOPHEN 325 MG PO TABS
ORAL_TABLET | ORAL | Status: AC
  Filled 2018-10-08: qty 2

## 2018-10-08 MED ORDER — LORAZEPAM 2 MG/ML IJ SOLN
INTRAMUSCULAR | Status: AC
  Filled 2018-10-08: qty 1

## 2018-10-08 NOTE — Progress Notes (Signed)
Nurse reviewed critical INR results with Dr. Jana Hakim.    Per Dr. Jana Hakim recommendations - pt to hold coumadin dose today - Coumadin 2.5 mg on 10/09/2018 - Resume Coumadin 5mg  on 10/10/2018. Ok to recheck PT/INR at next scheduled appointment in March.     Pt instructed of above, verbalized understanding.

## 2018-10-08 NOTE — Telephone Encounter (Signed)
Gave avs and calendar ° °

## 2018-10-08 NOTE — Patient Instructions (Signed)
[facility] Discharge Instructions for Patients Receiving Chemotherapy  Today you received the following chemotherapy agents Herceptin  To help prevent nausea and vomiting after your treatment, we encourage you to take your nausea medication as directed   If you develop nausea and vomiting that is not controlled by your nausea medication, call the clinic.   BELOW ARE SYMPTOMS THAT SHOULD BE REPORTED IMMEDIATELY:  *FEVER GREATER THAN 100.5 F  *CHILLS WITH OR WITHOUT FEVER  NAUSEA AND VOMITING THAT IS NOT CONTROLLED WITH YOUR NAUSEA MEDICATION  *UNUSUAL SHORTNESS OF BREATH  *UNUSUAL BRUISING OR BLEEDING  TENDERNESS IN MOUTH AND THROAT WITH OR WITHOUT PRESENCE OF ULCERS  *URINARY PROBLEMS  *BOWEL PROBLEMS  UNUSUAL RASH Items with * indicate a potential emergency and should be followed up as soon as possible.  Feel free to call the clinic should you have any questions or concerns. The clinic phone number is [phone].  Please show the CHEMO ALERT CARD at check-in to the Emergency Department and triage nurse.   

## 2018-10-08 NOTE — Progress Notes (Unsigned)
Critical INR 4.28 was called to Katheren Puller, Rn [id number] 10/08/2018 by W[facility])

## 2018-10-09 ENCOUNTER — Other Ambulatory Visit: Payer: Self-pay | Admitting: Oncology

## 2018-10-09 DIAGNOSIS — R209 Unspecified disturbances of skin sensation: Secondary | ICD-10-CM | POA: Diagnosis not present

## 2018-10-09 DIAGNOSIS — D649 Anemia, unspecified: Secondary | ICD-10-CM | POA: Diagnosis not present

## 2018-10-09 DIAGNOSIS — Z6841 Body Mass Index (BMI) 40.0 and over, adult: Secondary | ICD-10-CM | POA: Diagnosis not present

## 2018-10-09 DIAGNOSIS — G629 Polyneuropathy, unspecified: Secondary | ICD-10-CM | POA: Diagnosis not present

## 2018-10-09 DIAGNOSIS — I10 Essential (primary) hypertension: Secondary | ICD-10-CM | POA: Diagnosis not present

## 2018-10-09 DIAGNOSIS — M170 Bilateral primary osteoarthritis of knee: Secondary | ICD-10-CM | POA: Diagnosis not present

## 2018-10-20 DIAGNOSIS — M170 Bilateral primary osteoarthritis of knee: Secondary | ICD-10-CM | POA: Diagnosis not present

## 2018-10-22 ENCOUNTER — Telehealth: Payer: Self-pay | Admitting: *Deleted

## 2018-10-22 ENCOUNTER — Other Ambulatory Visit: Payer: Self-pay | Admitting: *Deleted

## 2018-10-22 NOTE — Telephone Encounter (Signed)
Pt left VM stating she has not heard regarding MD request to schedule bone density test.  This RN noted MD ordered above for April 2020 to be done at [facility]  Order printed and faxed to [facility] informed pt.

## 2018-10-27 ENCOUNTER — Other Ambulatory Visit: Payer: Self-pay | Admitting: *Deleted

## 2018-10-29 DIAGNOSIS — Z780 Asymptomatic menopausal state: Secondary | ICD-10-CM | POA: Diagnosis not present

## 2018-10-29 DIAGNOSIS — Z853 Personal history of malignant neoplasm of breast: Secondary | ICD-10-CM | POA: Diagnosis not present

## 2018-10-29 DIAGNOSIS — M8589 Other specified disorders of bone density and structure, multiple sites: Secondary | ICD-10-CM | POA: Diagnosis not present

## 2018-10-29 DIAGNOSIS — Z8262 Family history of osteoporosis: Secondary | ICD-10-CM | POA: Diagnosis not present

## 2018-11-05 ENCOUNTER — Inpatient Hospital Stay: Payer: Medicare Other | Attending: Oncology

## 2018-11-05 ENCOUNTER — Other Ambulatory Visit: Payer: Self-pay

## 2018-11-05 ENCOUNTER — Inpatient Hospital Stay: Payer: Medicare Other

## 2018-11-05 VITALS — BP 153/83 | HR 60 | Temp 98.6°F | Resp 18

## 2018-11-05 DIAGNOSIS — I871 Compression of vein: Secondary | ICD-10-CM | POA: Insufficient documentation

## 2018-11-05 DIAGNOSIS — C50812 Malignant neoplasm of overlapping sites of left female breast: Secondary | ICD-10-CM | POA: Insufficient documentation

## 2018-11-05 DIAGNOSIS — C50911 Malignant neoplasm of unspecified site of right female breast: Secondary | ICD-10-CM

## 2018-11-05 DIAGNOSIS — Z7901 Long term (current) use of anticoagulants: Secondary | ICD-10-CM | POA: Diagnosis not present

## 2018-11-05 DIAGNOSIS — Z5112 Encounter for antineoplastic immunotherapy: Secondary | ICD-10-CM | POA: Insufficient documentation

## 2018-11-05 DIAGNOSIS — C50912 Malignant neoplasm of unspecified site of left female breast: Secondary | ICD-10-CM

## 2018-11-05 DIAGNOSIS — C50919 Malignant neoplasm of unspecified site of unspecified female breast: Secondary | ICD-10-CM

## 2018-11-05 LAB — PROTIME-INR
INR: 1.9 — ABNORMAL HIGH (ref 0.8–1.2)
Prothrombin Time: 21.5 seconds — ABNORMAL HIGH (ref 11.4–15.2)

## 2018-11-05 LAB — CBC WITH DIFFERENTIAL/PLATELET
Abs Immature Granulocytes: 0.02 10*3/uL (ref 0.00–0.07)
BASOS PCT: 0 %
Basophils Absolute: 0 10*3/uL (ref 0.0–0.1)
Eosinophils Absolute: 0.1 10*3/uL (ref 0.0–0.5)
Eosinophils Relative: 1 %
HCT: 35.1 % — ABNORMAL LOW (ref 36.0–46.0)
Hemoglobin: 11.7 g/dL — ABNORMAL LOW (ref 12.0–15.0)
Immature Granulocytes: 0 %
Lymphocytes Relative: 25 %
Lymphs Abs: 2.3 10*3/uL (ref 0.7–4.0)
MCH: 28.9 pg (ref 26.0–34.0)
MCHC: 33.3 g/dL (ref 30.0–36.0)
MCV: 86.7 fL (ref 80.0–100.0)
MONO ABS: 0.7 10*3/uL (ref 0.1–1.0)
Monocytes Relative: 8 %
Neutro Abs: 6.2 10*3/uL (ref 1.7–7.7)
Neutrophils Relative %: 66 %
Platelets: 261 10*3/uL (ref 150–400)
RBC: 4.05 MIL/uL (ref 3.87–5.11)
RDW: 15 % (ref 11.5–15.5)
WBC: 9.3 10*3/uL (ref 4.0–10.5)
nRBC: 0 % (ref 0.0–0.2)

## 2018-11-05 LAB — COMPREHENSIVE METABOLIC PANEL
ALT: 7 U/L (ref 0–44)
AST: 7 U/L — ABNORMAL LOW (ref 15–41)
Albumin: 3.4 g/dL — ABNORMAL LOW (ref 3.5–5.0)
Alkaline Phosphatase: 67 U/L (ref 38–126)
Anion gap: 7 (ref 5–15)
BUN: 27 mg/dL — ABNORMAL HIGH (ref 8–23)
CHLORIDE: 110 mmol/L (ref 98–111)
CO2: 27 mmol/L (ref 22–32)
CREATININE: 0.93 mg/dL (ref 0.44–1.00)
Calcium: 9.2 mg/dL (ref 8.9–10.3)
GFR calc Af Amer: >60 mL/min (ref >60)
Glucose, Bld: 94 mg/dL (ref 70–99)
Potassium: 4.4 mmol/L (ref 3.5–5.1)
Sodium: 144 mmol/L (ref 135–145)
Total Bilirubin: 0.7 mg/dL (ref 0.3–1.2)
Total Protein: 7.3 g/dL (ref 6.5–8.1)

## 2018-11-05 MED ORDER — DIPHENHYDRAMINE HCL 25 MG PO CAPS
ORAL_CAPSULE | ORAL | Status: AC
  Filled 2018-11-05: qty 1

## 2018-11-05 MED ORDER — HEPARIN SOD (PORK) LOCK FLUSH 100 UNIT/ML IV SOLN
500.0000 [IU] | Freq: Once | INTRAVENOUS | Status: AC | PRN
  Administered 2018-11-05: 500 [IU]
  Filled 2018-11-05: qty 5

## 2018-11-05 MED ORDER — SODIUM CHLORIDE 0.9% FLUSH
10.0000 mL | INTRAVENOUS | Status: DC | PRN
  Administered 2018-11-05: 10 mL
  Filled 2018-11-05: qty 10

## 2018-11-05 MED ORDER — LORAZEPAM 2 MG/ML IJ SOLN
INTRAMUSCULAR | Status: AC
  Filled 2018-11-05: qty 1

## 2018-11-05 MED ORDER — SODIUM CHLORIDE 0.9 % IV SOLN
Freq: Once | INTRAVENOUS | Status: AC
  Administered 2018-11-05: 10:00:00 via INTRAVENOUS
  Filled 2018-11-05: qty 250

## 2018-11-05 MED ORDER — LORAZEPAM 2 MG/ML IJ SOLN
1.0000 mg | Freq: Once | INTRAMUSCULAR | Status: AC | PRN
  Administered 2018-11-05: 1 mg via INTRAVENOUS

## 2018-11-05 MED ORDER — TRASTUZUMAB CHEMO 150 MG IV SOLR
798.0000 mg | Freq: Once | INTRAVENOUS | Status: AC
  Administered 2018-11-05: 798 mg via INTRAVENOUS
  Filled 2018-11-05: qty 38

## 2018-11-05 MED ORDER — ACETAMINOPHEN 325 MG PO TABS
650.0000 mg | ORAL_TABLET | Freq: Once | ORAL | Status: AC
  Administered 2018-11-05: 650 mg via ORAL

## 2018-11-05 MED ORDER — DIPHENHYDRAMINE HCL 25 MG PO CAPS
25.0000 mg | ORAL_CAPSULE | Freq: Once | ORAL | Status: AC
  Administered 2018-11-05: 25 mg via ORAL

## 2018-11-05 MED ORDER — ACETAMINOPHEN 325 MG PO TABS
ORAL_TABLET | ORAL | Status: AC
  Filled 2018-11-05: qty 2

## 2018-11-05 NOTE — Patient Instructions (Signed)
[facility] Discharge Instructions for Patients Receiving Chemotherapy  Today you received the following chemotherapy agents Herceptin  To help prevent nausea and vomiting after your treatment, we encourage you to take your nausea medication as directed   If you develop nausea and vomiting that is not controlled by your nausea medication, call the clinic.   BELOW ARE SYMPTOMS THAT SHOULD BE REPORTED IMMEDIATELY:  *FEVER GREATER THAN 100.5 F  *CHILLS WITH OR WITHOUT FEVER  NAUSEA AND VOMITING THAT IS NOT CONTROLLED WITH YOUR NAUSEA MEDICATION  *UNUSUAL SHORTNESS OF BREATH  *UNUSUAL BRUISING OR BLEEDING  TENDERNESS IN MOUTH AND THROAT WITH OR WITHOUT PRESENCE OF ULCERS  *URINARY PROBLEMS  *BOWEL PROBLEMS  UNUSUAL RASH Items with * indicate a potential emergency and should be followed up as soon as possible.  Feel free to call the clinic should you have any questions or concerns. The clinic phone number is [phone].  Please show the CHEMO ALERT CARD at check-in to the Emergency Department and triage nurse.   

## 2018-11-07 ENCOUNTER — Other Ambulatory Visit (HOSPITAL_COMMUNITY): Payer: Self-pay

## 2018-11-07 MED ORDER — FUROSEMIDE 40 MG PO TABS: 40.0000 mg | ORAL_TABLET | Freq: Every day | ORAL | 3 refills | Status: DC

## 2018-11-20 ENCOUNTER — Telehealth: Payer: Self-pay

## 2018-11-20 NOTE — Telephone Encounter (Signed)
Pt inquiring about upcoming appointments.  Nurse informed patient need to keep appointment due to active treatment.  Patient voiced understanding.  Patient was notified of new policy regarding visitor restrictions at this time.  All questions were answered, no further needs.

## 2018-11-24 ENCOUNTER — Other Ambulatory Visit: Payer: Self-pay | Admitting: Oncology

## 2018-11-24 ENCOUNTER — Other Ambulatory Visit (HOSPITAL_COMMUNITY): Payer: Self-pay | Admitting: Cardiology

## 2018-12-02 ENCOUNTER — Telehealth: Payer: Self-pay | Admitting: Oncology

## 2018-12-02 NOTE — Telephone Encounter (Signed)
R/s appt per 4/7 sch message - pt aware of new appt date and time

## 2018-12-03 ENCOUNTER — Inpatient Hospital Stay: Payer: Medicare Other

## 2018-12-03 ENCOUNTER — Inpatient Hospital Stay: Payer: Medicare Other | Admitting: Oncology

## 2018-12-23 DIAGNOSIS — Z6841 Body Mass Index (BMI) 40.0 and over, adult: Secondary | ICD-10-CM | POA: Diagnosis not present

## 2018-12-23 DIAGNOSIS — J351 Hypertrophy of tonsils: Secondary | ICD-10-CM | POA: Diagnosis not present

## 2018-12-24 ENCOUNTER — Other Ambulatory Visit: Payer: Self-pay | Admitting: Oncology

## 2018-12-25 ENCOUNTER — Ambulatory Visit (HOSPITAL_COMMUNITY): Payer: PRIVATE HEALTH INSURANCE

## 2018-12-26 ENCOUNTER — Other Ambulatory Visit: Payer: Self-pay

## 2018-12-26 ENCOUNTER — Ambulatory Visit (HOSPITAL_COMMUNITY)
Admission: RE | Admit: 2018-12-26 | Discharge: 2018-12-26 | Disposition: A | Discharge status: Home / Self Care | Payer: Medicare Other | Source: Ambulatory Visit | Attending: Oncology | Admitting: Oncology

## 2018-12-26 ENCOUNTER — Telehealth: Payer: Self-pay | Admitting: *Deleted

## 2018-12-26 DIAGNOSIS — Z9013 Acquired absence of bilateral breasts and nipples: Secondary | ICD-10-CM | POA: Insufficient documentation

## 2018-12-26 DIAGNOSIS — C50919 Malignant neoplasm of unspecified site of unspecified female breast: Secondary | ICD-10-CM | POA: Diagnosis not present

## 2018-12-26 DIAGNOSIS — I10 Essential (primary) hypertension: Secondary | ICD-10-CM | POA: Insufficient documentation

## 2018-12-26 LAB — GLUCOSE, CAPILLARY: Glucose-Capillary: 83 mg/dL (ref 70–99)

## 2018-12-26 MED ORDER — FLUDEOXYGLUCOSE F - 18 (FDG) INJECTION
13.4100 | Freq: Once | INTRAVENOUS | Status: AC | PRN
  Administered 2018-12-26: 09:00:00 13.41 via INTRAVENOUS

## 2018-12-26 NOTE — Telephone Encounter (Signed)
Notified of negative PET scan

## 2018-12-29 ENCOUNTER — Other Ambulatory Visit: Payer: PRIVATE HEALTH INSURANCE

## 2018-12-31 NOTE — Progress Notes (Signed)
[facility]  [phone] [phone]    ID: [name]   DOB: 11/09/1950  MR#: [id number]  XVQ#:[id number]   Patient Care Team: Ernestene Kiel, MD as PCP - General (Internal Medicine) Haze Rushing., MD as Consulting Physician (Pain Medicine) Joya Salm, MD as Referring Physician (Orthopedic Surgery) Jaymes Graff, DO as Consulting Physician (Orthopedic Surgery) Larey Dresser, MD as Consulting Physician (Cardiology) Bensimhon, Shaune Pascal, MD as Consulting Physician (Cardiology) Lorelle Gibbs, MD (Radiology) Tommy Medal, Lavell Islam, MD as Consulting Physician (Infectious Diseases) Gery Pray, MD as Consulting Physician (Radiation Oncology) Manan Olmo, Virgie Dad, MD as Consulting Physician (Oncology)   CHIEF COMPLAINT: Metastatic HER-2 positive, estrogen receptor negative breast cancer  CURRENT TREATMENT: Trastuzumab every 28 days; lifelong warfarin   INTERVAL HISTORY: [name] returns today for follow-up and treatment of her estrogen receptor negative, but HER2 amplified breast cancer.  She continues on trastuzumab. She tolerates this well and without any noticeable side effects.   She also continues on warfarin. She tolerates this well and without any noticeable side effects.   Since her last visit, she underwent bone density testing on 10/29/2018, which showed a T-score of -1.4. This is considered osteopenic.  She also underwent PET scan on 12/26/2018. This showed no evidence of recurrent or metastatic disease.  [name]'s last echocardiogram on 09/30/2018, showed an ejection fraction in the 60% - 65% range.  Her next echocardiogram is already scheduled for 03/02/2019.   REVIEW OF SYSTEMS: Mayela reports doing well overall. She notes she was given oxycodone for chest wall pain, but she tried not to use it and takes Tylenol when she can. She reports occasional pain that runs up her back, intense headaches, chest spasms, and a  "prickly or tingly" feeling across her chest and under her arms related to her surgery. She states she and her husband try not to leave the house except for doctor appointments. She reports she has trouble getting out to exercise due to caring for husband, who is confined to a wheelchair.   The patient denies unusual headaches, visual changes, nausea, vomiting, stiff neck, dizziness, or gait imbalance. There has been no cough, phlegm production, or pleurisy, no chest pain or pressure, and no change in bowel or bladder habits. The patient denies fever, rash, bleeding, unexplained fatigue or unexplained weight loss. A detailed review of systems was otherwise entirely negative.   BREAST CANCER HISTORY:   From the earlier summary:  [name] is 68 years old [name] female.  This woman has been in good health all of her life.  She noted a swelling and discomfort in her right breast in June 2004.  She was seen in the Emergency Room in [place] and was treated for mastitis.  She was treated for a number of months with mastitis and the swelling did not get better. She was given hydrocodone and Cipro.  Finally, the swelling did get better and ultimately the nipple became retracted and she noticed some dimpling in her skin. She had a mammogram in July of 2004 in [place] with subsequent mammogram on May 27, 2003, by Dr. Isaiah Blakes.  Mammogram done on September 30 showed marked increased density in the left breast.  Biopsy was performed the same day.  It was noted at the 12 o'clock position, deep in the breast was a focal hypoechoic mass, at least 3.5 cm in diameter.  Biopsy did in fact show invasive in situ mammary carcinoma. This was felt to be both at  least intermediate, high grade.  No definite lymphovascular invasion was identified. ER and PR negative, Her2 testing positive. Amany continues to have pain in her breast.  She continues to take hydrocodone a number of times a day.  She has been seen by  Dr. Rosana Hoes, who felt that neoadjuvant chemotherapy would be required.    Initial staging studies showed evidence of liver and lung mets.   Patient also has evidence of bone lesions. Patient started neoadjuvant chemotherapy, Taxotere/Carbo/Herceptin in October 2004.   Patient had a CT scan in December 2004 which demonstrated extensive clot in the SVC innominate vein, bilateral jugular vein and  She was started on anticoagulation therapy. She received a total of 6 cycles of Taxotere/Carbo/Herceptin, completed in April 2005."  Patient has been on  trastuzumab continued indefinitely; has also received lapatinib and capecitabine for variable intervals in 2007-2008. Most recent echo 12/01/2013 showed an ejection fraction of 55%. She is status post bilateral mastectomies with bilateral axillary lymph node dissection 12/07/2004, showing (a) on the right, a mypT1c ypN1 invasive ductal carcinoma, grade 3, estrogen and progesterone receptor negative, HER-2 positive, with an MIB-1 of 31% (b) on the left, ypT2 ypN1 invasive ductal carcinoma, grade 2, estrogen and progesterone receptor negative, HER-2 positive, with an MIB-1 of 35%. She is Status post radiation June through July of 2006, to the right chest wall, left chest wall, bilateral supraclavicular fossae, and bilateral axillary boosts; with additional radiation to the right and left chest walls and the central chest wall completed November of 2007. She is status post ixempra x9 completed August of 2009. 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 Patient has chronic pain, with negative PET scan 08/24/2013 (no evidence of active cancer). On Neurontin and Tramadol therapy.  Her subsequent history is as detailed below  PAST MEDICAL HISTORY: Past Medical History:  Diagnosis Date   Breast cancer ([place])    mets to liver and lung   Breast cancer metastasized to multiple sites [facility])  02/26/2013   History of chemotherapy Feb. 2006   taxotere/herceptin/carboplatin   Hypertension    Neuropathy    Radiation 07/31/2006   left upper chest   Radiation 06/17/2006-06/27/2006   6480 cGy bilat. chest wall   SVC syndrome    Thrombosis     PAST SURGICAL HISTORY: Past Surgical History:  Procedure Laterality Date   ANKLE SURGERY     BACK SURGERY     CHOLECYSTECTOMY  1989   MASTECTOMY Bilateral    PERIPHERALLY INSERTED CENTRAL CATHETER INSERTION     TUBAL LIGATION  1986    FAMILY HISTORY Family History  Problem Relation Age of Onset   Heart failure Father    Cancer Father        Prostate cancer   Heart failure Brother    Cancer Brother        Prostate cancer   Diabetes Maternal Aunt    She had three brothers, one died of gunshot wound, one of complications of diabetes mellitus and one of myocardial infarction.  She has no sisters.  Mother died of complications of brain metastasis in 30.  Father has had a myocardial infarction in 1999.  No history of breast or ovarian cancer in the family.     GYNECOLOGIC HISTORY:   Menarche at age 11.  Gravida 3, para 3.  First live birth at age 60.  No history of breast feeding. No history of hormonal replacement therapy.  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 He used to work as a Art gallery manager, but is now retired and disabled. The patient has three children, Monette who lives in [place] and works as a Hydrographic surveyor, Financial risk analyst who lives in [place] and works as a Administrator, and [place] who lives in [place] and also works as a Hydrographic surveyor. The patient has 14 grandchildren and 4 great-grandchildren. She attends a [employer]. Very involved with school kids.  ADVANCED DIRECTIVES:  Not in place   HEALTH MAINTENANCE: (Updated 06/12/2013) Social History    Tobacco Use   Smoking status: Never Smoker   Smokeless tobacco: Never Used  Substance Use Topics   Alcohol use: Yes    Comment: occasional   Drug use: No     Colonoscopy: Never and "I don't want one"  PAP:  1987  Bone density:  Never   Lipid panel:  Not on file    Allergies  Allergen Reactions   Penicillins Hives    PATIENT HAS TOLERATED CEPHALOSPORINGS   Adhesive [Tape] Other (See Comments)    Tears skin     Current Outpatient Medications  Medication Sig Dispense Refill   acetaminophen (TYLENOL) 500 MG tablet Take 1,000 mg by mouth every 6 (six) hours as needed for mild pain or fever.      albuterol (PROVENTIL HFA;VENTOLIN HFA) 108 (90 BASE) MCG/ACT inhaler Inhale 2 puffs into the lungs every 6 (six) hours as needed for wheezing. 1 Inhaler 5   ALPRAZolam (XANAX) 1 MG tablet TAKE 1 TABLET BY MOUTH THREE TIMES DAILY AS NEEDED FOR ANXIETY 60 tablet 2   amLODipine (NORVASC) 10 MG tablet Take 10 mg by mouth every morning.     baclofen (LIORESAL) 10 MG tablet TAKE 1 TABLET BY MOUTH THREE TIMES DAILY AS NEEDED FOR MUSCLE SPASMS 270 tablet 0   carvedilol (COREG) 6.25 MG tablet Take 1 tablet (6.25 mg total) by mouth 2 (two) times daily. 60 tablet 3   diclofenac sodium (VOLTAREN) 1 % GEL Apply 2 g topically daily as needed (for pain). Apply to knees and shoulders 100 g 6   furosemide (LASIX) 40 MG tablet Take 1 tablet (40 mg total) by mouth daily. 30 tablet 3   gabapentin (NEURONTIN) 300 MG capsule TAKE 2 CAPSULES(600 MG) BY MOUTH THREE TIMES DAILY 540 capsule 0   losartan (COZAAR) 100 MG tablet Take 100 mg by mouth every morning.     potassium chloride (K-DUR,KLOR-CON) 20 MEQ tablet Take 1 tablet (20 mEq total) by mouth daily. 30 tablet 3   spironolactone (ALDACTONE) 25 MG tablet TAKE 1 TABLET(25 MG) BY MOUTH DAILY 30 tablet 5   temazepam (RESTORIL) 30 MG capsule TAKE ONE CAPSULE BY MOUTH EVERY NIGHT AT BEDTIME AS NEEDED FOR SLEEP 30 capsule 2   warfarin  (COUMADIN) 5 MG tablet Take 5 mg by mouth daily.     No current facility-administered medications for this visit.    Facility-Administered Medications Ordered in Other Visits  Medication Dose Route Frequency Provider Last Rate Last Dose   sodium chloride flush (NS) 0.9 % injection 10 mL  10 mL Intravenous PRN Gina Costilla, Virgie Dad, MD   10 mL at 12/15/15 1200   sodium chloride flush (NS) 0.9 % injection 10 mL  10 mL Intracatheter PRN Jasimine Simms, Virgie Dad, MD   10 mL at 08/14/18 1116  OBJECTIVE: Morbidly obese African-American woman who appears well   Vitals:   01/01/19 1055  BP: (!) 163/66  Pulse: 69  Resp: 18  Temp: 98.2 F (36.8 C)  SpO2: 98%     Body mass index is 47.08 kg/m.    ECOG FS: 1 Filed Weights   01/01/19 1055  Weight: 265 lb 12.8 oz (120.6 kg)   Sclerae unicteric, EOMs intact No cervical or supraclavicular adenopathy Lungs no rales or rhonchi Heart regular rate and rhythm Abd soft, nontender, positive bowel sounds MSK no focal spinal tenderness, grade 1 chronic left upper extremity lymphedema Neuro: nonfocal, well oriented, appropriate affect Breasts: Status post bilateral mastectomies.  No evidence of chest wall recurrence  LAB RESULTS: Lab Results  Component Value Date   WBC 10.9 (H) 01/01/2019   NEUTROABS 8.5 (H) 01/01/2019   HGB 11.9 (L) 01/01/2019   HCT 35.0 (L) 01/01/2019   MCV 85.8 01/01/2019   PLT 308 01/01/2019      Chemistry      Component Value Date/Time   NA 144 11/05/2018 0832   NA 143 08/14/2017 0811   K 4.4 11/05/2018 0832   K 3.8 08/14/2017 0811   CL 110 11/05/2018 0832   CL 104 01/30/2013 0850   CO2 27 11/05/2018 0832   CO2 24 08/14/2017 0811   BUN 27 (H) 11/05/2018 0832   BUN 14.9 08/14/2017 0811   CREATININE 0.93 11/05/2018 0832   CREATININE 0.9 08/14/2017 0811      Component Value Date/Time   CALCIUM 9.2 11/05/2018 0832   CALCIUM 9.4 08/14/2017 0811   ALKPHOS 67 11/05/2018 0832   ALKPHOS 73 08/14/2017 0811   AST 7 (L)  11/05/2018 0832   AST 10 08/14/2017 0811   ALT 7 11/05/2018 0832   ALT 10 08/14/2017 0811   BILITOT 0.7 11/05/2018 0832   BILITOT 0.62 08/14/2017 0811     STUDIES: Nm Pet Image Restag (ps) Skull Base To Thigh  Result Date: 12/26/2018 CLINICAL DATA:  Subsequent treatment strategy for metastatic breast cancer. EXAM: NUCLEAR MEDICINE PET SKULL BASE TO THIGH TECHNIQUE: 13.4 mCi F-18 FDG was injected intravenously. Full-ring PET imaging was performed from the skull base to thigh after the radiotracer. CT data was obtained and used for attenuation correction and anatomic localization. Fasting blood glucose: 83 mg/dl COMPARISON:  PET-CT dated 07/09/2018 FINDINGS: Mediastinal blood pool activity: SUV max 3.2 NECK: No hypermetabolic cervical lymphadenopathy. Incidental CT findings: none CHEST: No suspicious pulmonary nodules. Mild mosaic attenuation in the bilateral upper lobes. No hypermetabolic thoracic lymphadenopathy. Status post mastectomy with bilateral axillary lymph node dissection. Cardiomegaly. Right chest port terminates at the cavoatrial junction. Incidental CT findings: none ABDOMEN/PELVIS: No hypermetabolic abdominopelvic lymphadenopathy. No abnormal hypermetabolism in the liver, spleen, pancreas, or bilateral adrenal glands. Status post cholecystectomy. Incidental CT findings: Calcified right adnexal lesions, chronic. 3.2 cm right lower pole renal cysts, unchanged. SKELETON: No focal hypermetabolic activity to suggest skeletal metastasis. Healed fracture deformity involving the right anterior 7th rib. Incidental CT findings: none IMPRESSION: Status post bilateral mastectomy with bilateral axillary lymph node dissection. No evidence of recurrent or metastatic disease. Electronically Signed   By: Julian Hy M.D.   On: 12/26/2018 10:11     ASSESSMENT: 68 y.o.  [place], [place], woman  (1)  with a history of inflammatory right breast cancer metastatic at presentation September 2004  with involvement of liver and bone, HER-2 positive, estrogen and progesterone receptor negative  (2) treated with carboplatin, docetaxel and Herceptin x 6 completed  April 2005  (3) trastuzumab continued indefinitely;   (a) has also received lapatinib and capecitabine for variable intervals in 2007-2008.  (b) Every 6 month echo: 09/24/2017 showed an ejection fraction in the 55-60%  (c) echocardiogram 03/07/2018 showed an ejection fraction in the 55-60%  (d) cardiogram 09/30/2018 showed ejection fraction in the 60-65% range.  (4) status post bilateral mastectomies with bilateral axillary lymph node dissection 12/07/2004, showing  (a) on the right, a mypT1c ypN1 invasive ductal carcinoma, grade 3, estrogen and progesterone receptor negative, HER-2 positive, with an MIB-1 of 31%  (b) on the left, ypT2 ypN1 invasive ductal carcinoma, grade 2, estrogen and progesterone receptor negative, HER-2 positive, with an MIB-1 of 35%.  (5)  Status post radiation June through July of 2006, to the right chest wall, left chest wall, bilateral supraclavicular fossae, and bilateral axillary boosts; with additional radiation to the right and left chest walls and the central chest wall completed November of 2007  (6) status post Ixempra x9 completed August of 2009.  (7) history of superior vena caval syndrome, on life long anticoagulation   (8)  History of chemotherapy-induced neuropathy.   (9)  chronic pain, with negative PET scan 08/24/2013 (no evidence of active cancer). On Neurontin and Tramadol  (a) repeat PET scan December 2016 again negative.  (b) Repeat PET scan 04/23/2017 shows no active malignancy  (c) PET scan 07/09/2018 shows no evidence of active malignancy.  (10) right upper extremity cellulitis, no bacteremia; treated with cephalexin /doxycycline for 2 weeks, with resolution  (11 subacute fracture right humerus, nondisplaced, status post fall 03/27/2017   PLAN: Lou is now just about 16  years out from initial diagnosis of metastatic breast cancer, with no evidence of disease activity.  This is very favorable.  She is tolerating trastuzumab well and the plan is to continue that indefinitely.  Her next echocardiogram is scheduled for 03/02/2019.  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  She is not exercising regularly.  She has significant knee problems which would be very difficult to address unless she lost some weight and that is not unfortunately in the [place] at this point.  There has been no further complication regarding her coagulopathy.  She has bruising but no bleeding  I will see her again in 3 months.  She knows to call for any other issue that may develop before the next visit.   Amaree Leeper, Virgie Dad, MD  01/01/19 11:12 AM Medical Oncology and Hematology [facility] [address] [place], [place] [address] Tel. [phone]    Fax. [phone]   I, Wilburn Mylar, am acting as scribe for Dr. Virgie Dad. Karrissa Parchment.  I, Lurline Del MD, have reviewed the above documentation for accuracy and completeness, and I agree with the above.

## 2019-01-01 ENCOUNTER — Inpatient Hospital Stay (HOSPITAL_BASED_OUTPATIENT_CLINIC_OR_DEPARTMENT_OTHER): Payer: Medicare Other | Admitting: Oncology

## 2019-01-01 ENCOUNTER — Inpatient Hospital Stay: Payer: Medicare Other

## 2019-01-01 ENCOUNTER — Inpatient Hospital Stay: Payer: Medicare Other | Attending: Oncology

## 2019-01-01 ENCOUNTER — Other Ambulatory Visit: Payer: Self-pay

## 2019-01-01 VITALS — BP 163/66 | HR 69 | Temp 98.2°F | Resp 18 | Ht 63.0 in | Wt 265.8 lb

## 2019-01-01 DIAGNOSIS — Z79899 Other long term (current) drug therapy: Secondary | ICD-10-CM | POA: Diagnosis not present

## 2019-01-01 DIAGNOSIS — C50919 Malignant neoplasm of unspecified site of unspecified female breast: Secondary | ICD-10-CM

## 2019-01-01 DIAGNOSIS — Z923 Personal history of irradiation: Secondary | ICD-10-CM

## 2019-01-01 DIAGNOSIS — Z9221 Personal history of antineoplastic chemotherapy: Secondary | ICD-10-CM

## 2019-01-01 DIAGNOSIS — C50912 Malignant neoplasm of unspecified site of left female breast: Secondary | ICD-10-CM | POA: Insufficient documentation

## 2019-01-01 DIAGNOSIS — Z7901 Long term (current) use of anticoagulants: Secondary | ICD-10-CM

## 2019-01-01 DIAGNOSIS — Z9013 Acquired absence of bilateral breasts and nipples: Secondary | ICD-10-CM | POA: Diagnosis not present

## 2019-01-01 DIAGNOSIS — Z171 Estrogen receptor negative status [ER-]: Secondary | ICD-10-CM

## 2019-01-01 DIAGNOSIS — D689 Coagulation defect, unspecified: Secondary | ICD-10-CM

## 2019-01-01 DIAGNOSIS — Z5112 Encounter for antineoplastic immunotherapy: Secondary | ICD-10-CM | POA: Diagnosis not present

## 2019-01-01 DIAGNOSIS — C787 Secondary malignant neoplasm of liver and intrahepatic bile duct: Secondary | ICD-10-CM

## 2019-01-01 DIAGNOSIS — C50911 Malignant neoplasm of unspecified site of right female breast: Secondary | ICD-10-CM | POA: Insufficient documentation

## 2019-01-01 DIAGNOSIS — C50311 Malignant neoplasm of lower-inner quadrant of right female breast: Secondary | ICD-10-CM

## 2019-01-01 DIAGNOSIS — C7951 Secondary malignant neoplasm of bone: Secondary | ICD-10-CM

## 2019-01-01 DIAGNOSIS — C50312 Malignant neoplasm of lower-inner quadrant of left female breast: Secondary | ICD-10-CM

## 2019-01-01 DIAGNOSIS — C50812 Malignant neoplasm of overlapping sites of left female breast: Secondary | ICD-10-CM | POA: Diagnosis present

## 2019-01-01 LAB — CBC WITH DIFFERENTIAL/PLATELET
Abs Immature Granulocytes: 0.03 10*3/uL (ref 0.00–0.07)
Basophils Absolute: 0 10*3/uL (ref 0.0–0.1)
Basophils Relative: 0 %
Eosinophils Absolute: 0 10*3/uL (ref 0.0–0.5)
Eosinophils Relative: 0 %
HCT: 35 % — ABNORMAL LOW (ref 36.0–46.0)
Hemoglobin: 11.9 g/dL — ABNORMAL LOW (ref 12.0–15.0)
Immature Granulocytes: 0 %
Lymphocytes Relative: 15 %
Lymphs Abs: 1.6 10*3/uL (ref 0.7–4.0)
MCH: 29.2 pg (ref 26.0–34.0)
MCHC: 34 g/dL (ref 30.0–36.0)
MCV: 85.8 fL (ref 80.0–100.0)
Monocytes Absolute: 0.7 10*3/uL (ref 0.1–1.0)
Monocytes Relative: 6 %
Neutro Abs: 8.5 10*3/uL — ABNORMAL HIGH (ref 1.7–7.7)
Neutrophils Relative %: 79 %
Platelets: 308 10*3/uL (ref 150–400)
RBC: 4.08 MIL/uL (ref 3.87–5.11)
RDW: 15.6 % — ABNORMAL HIGH (ref 11.5–15.5)
WBC: 10.9 10*3/uL — ABNORMAL HIGH (ref 4.0–10.5)
nRBC: 0 % (ref 0.0–0.2)

## 2019-01-01 LAB — CMP (CANCER CENTER ONLY)
ALT: 13 U/L (ref 0–44)
AST: 12 U/L — ABNORMAL LOW (ref 15–41)
Albumin: 3.8 g/dL (ref 3.5–5.0)
Alkaline Phosphatase: 69 U/L (ref 38–126)
Anion gap: 11 (ref 5–15)
BUN: 37 mg/dL — ABNORMAL HIGH (ref 8–23)
CO2: 27 mmol/L (ref 22–32)
Calcium: 9.4 mg/dL (ref 8.9–10.3)
Chloride: 104 mmol/L (ref 98–111)
Creatinine: 1.44 mg/dL — ABNORMAL HIGH (ref 0.44–1.00)
GFR, Est AFR Am: 43 mL/min — ABNORMAL LOW (ref >60)
GFR, Estimated: 37 mL/min — ABNORMAL LOW (ref >60)
Glucose, Bld: 120 mg/dL — ABNORMAL HIGH (ref 70–99)
Potassium: 4.1 mmol/L (ref 3.5–5.1)
Sodium: 142 mmol/L (ref 135–145)
Total Bilirubin: 0.7 mg/dL (ref 0.3–1.2)
Total Protein: 7.8 g/dL (ref 6.5–8.1)

## 2019-01-01 LAB — PROTIME-INR
INR: 1.7 — ABNORMAL HIGH (ref 0.8–1.2)
Prothrombin Time: 20.1 seconds — ABNORMAL HIGH (ref 11.4–15.2)

## 2019-01-01 MED ORDER — ACETAMINOPHEN 325 MG PO TABS
ORAL_TABLET | ORAL | Status: AC
  Filled 2019-01-01: qty 2

## 2019-01-01 MED ORDER — TRASTUZUMAB CHEMO 150 MG IV SOLR
750.0000 mg | Freq: Once | INTRAVENOUS | Status: AC
  Administered 2019-01-01: 750 mg via INTRAVENOUS
  Filled 2019-01-01: qty 35.72

## 2019-01-01 MED ORDER — SODIUM CHLORIDE 0.9% FLUSH
10.0000 mL | INTRAVENOUS | Status: DC | PRN
  Administered 2019-01-01: 14:00:00 10 mL
  Filled 2019-01-01: qty 10

## 2019-01-01 MED ORDER — SODIUM CHLORIDE 0.9 % IV SOLN
Freq: Once | INTRAVENOUS | Status: AC
  Administered 2019-01-01: 12:00:00 via INTRAVENOUS
  Filled 2019-01-01: qty 250

## 2019-01-01 MED ORDER — LORAZEPAM 2 MG/ML IJ SOLN
INTRAMUSCULAR | Status: AC
  Filled 2019-01-01: qty 1

## 2019-01-01 MED ORDER — DIPHENHYDRAMINE HCL 25 MG PO CAPS
25.0000 mg | ORAL_CAPSULE | Freq: Once | ORAL | Status: AC
  Administered 2019-01-01: 12:00:00 25 mg via ORAL

## 2019-01-01 MED ORDER — HEPARIN SOD (PORK) LOCK FLUSH 100 UNIT/ML IV SOLN
500.0000 [IU] | Freq: Once | INTRAVENOUS | Status: AC | PRN
  Administered 2019-01-01: 14:00:00 500 [IU]
  Filled 2019-01-01: qty 5

## 2019-01-01 MED ORDER — LORAZEPAM 2 MG/ML IJ SOLN
1.0000 mg | Freq: Once | INTRAMUSCULAR | Status: AC | PRN
  Administered 2019-01-01: 1 mg via INTRAVENOUS

## 2019-01-01 MED ORDER — DIPHENHYDRAMINE HCL 25 MG PO CAPS
ORAL_CAPSULE | ORAL | Status: AC
  Filled 2019-01-01: qty 1

## 2019-01-01 MED ORDER — ACETAMINOPHEN 325 MG PO TABS
650.0000 mg | ORAL_TABLET | Freq: Once | ORAL | Status: AC
  Administered 2019-01-01: 650 mg via ORAL

## 2019-01-01 NOTE — Patient Instructions (Signed)
[facility] Discharge Instructions for Patients Receiving Chemotherapy  Today you received the following chemotherapy agents Herceptin  To help prevent nausea and vomiting after your treatment, we encourage you to take your nausea medication as directed   If you develop nausea and vomiting that is not controlled by your nausea medication, call the clinic.   BELOW ARE SYMPTOMS THAT SHOULD BE REPORTED IMMEDIATELY:  *FEVER GREATER THAN 100.5 F  *CHILLS WITH OR WITHOUT FEVER  NAUSEA AND VOMITING THAT IS NOT CONTROLLED WITH YOUR NAUSEA MEDICATION  *UNUSUAL SHORTNESS OF BREATH  *UNUSUAL BRUISING OR BLEEDING  TENDERNESS IN MOUTH AND THROAT WITH OR WITHOUT PRESENCE OF ULCERS  *URINARY PROBLEMS  *BOWEL PROBLEMS  UNUSUAL RASH Items with * indicate a potential emergency and should be followed up as soon as possible.  Feel free to call the clinic should you have any questions or concerns. The clinic phone number is [phone].  Please show the CHEMO ALERT CARD at check-in to the Emergency Department and triage nurse.   

## 2019-01-01 NOTE — Progress Notes (Signed)
01/01/19  Ok to adjust dose with small weight change to prevent drug wastage.    New wt:  120.6 kg = 723 mg rounded to 750 mg  V.O. Dr Magrinat/Valerie Nicola Girt RN/Joann Kulpa Ronnald Ramp, PharmD

## 2019-01-02 ENCOUNTER — Telehealth: Payer: Self-pay | Admitting: Oncology

## 2019-01-02 NOTE — Telephone Encounter (Signed)
Called regarding schedule °

## 2019-01-07 ENCOUNTER — Ambulatory Visit: Payer: PRIVATE HEALTH INSURANCE | Admitting: Oncology

## 2019-01-15 DIAGNOSIS — M170 Bilateral primary osteoarthritis of knee: Secondary | ICD-10-CM | POA: Diagnosis not present

## 2019-01-15 DIAGNOSIS — Z6841 Body Mass Index (BMI) 40.0 and over, adult: Secondary | ICD-10-CM | POA: Diagnosis not present

## 2019-01-16 ENCOUNTER — Other Ambulatory Visit (HOSPITAL_COMMUNITY): Payer: Self-pay | Admitting: Cardiology

## 2019-01-29 ENCOUNTER — Other Ambulatory Visit: Payer: Self-pay

## 2019-01-29 ENCOUNTER — Other Ambulatory Visit: Payer: Medicare Other

## 2019-01-29 ENCOUNTER — Inpatient Hospital Stay: Payer: Medicare Other | Attending: Oncology

## 2019-01-29 ENCOUNTER — Inpatient Hospital Stay: Payer: Medicare Other

## 2019-01-29 ENCOUNTER — Ambulatory Visit: Payer: Medicare Other

## 2019-01-29 VITALS — BP 149/83 | HR 62 | Temp 98.3°F | Resp 18 | Ht 63.0 in | Wt 269.2 lb

## 2019-01-29 DIAGNOSIS — C50911 Malignant neoplasm of unspecified site of right female breast: Secondary | ICD-10-CM | POA: Diagnosis not present

## 2019-01-29 DIAGNOSIS — C50812 Malignant neoplasm of overlapping sites of left female breast: Secondary | ICD-10-CM | POA: Diagnosis not present

## 2019-01-29 DIAGNOSIS — Z5112 Encounter for antineoplastic immunotherapy: Secondary | ICD-10-CM | POA: Diagnosis not present

## 2019-01-29 DIAGNOSIS — Z7901 Long term (current) use of anticoagulants: Secondary | ICD-10-CM | POA: Insufficient documentation

## 2019-01-29 DIAGNOSIS — C50919 Malignant neoplasm of unspecified site of unspecified female breast: Secondary | ICD-10-CM

## 2019-01-29 LAB — CBC WITH DIFFERENTIAL/PLATELET
Abs Immature Granulocytes: 0.04 10*3/uL (ref 0.00–0.07)
Basophils Absolute: 0 10*3/uL (ref 0.0–0.1)
Basophils Relative: 0 %
Eosinophils Absolute: 0.1 10*3/uL (ref 0.0–0.5)
Eosinophils Relative: 1 %
HCT: 35 % — ABNORMAL LOW (ref 36.0–46.0)
Hemoglobin: 11.8 g/dL — ABNORMAL LOW (ref 12.0–15.0)
Immature Granulocytes: 0 %
Lymphocytes Relative: 17 %
Lymphs Abs: 1.9 10*3/uL (ref 0.7–4.0)
MCH: 28.9 pg (ref 26.0–34.0)
MCHC: 33.7 g/dL (ref 30.0–36.0)
MCV: 85.6 fL (ref 80.0–100.0)
Monocytes Absolute: 0.8 10*3/uL (ref 0.1–1.0)
Monocytes Relative: 7 %
Neutro Abs: 8.5 10*3/uL — ABNORMAL HIGH (ref 1.7–7.7)
Neutrophils Relative %: 75 %
Platelets: 262 10*3/uL (ref 150–400)
RBC: 4.09 MIL/uL (ref 3.87–5.11)
RDW: 15 % (ref 11.5–15.5)
WBC: 11.3 10*3/uL — ABNORMAL HIGH (ref 4.0–10.5)
nRBC: 0 % (ref 0.0–0.2)

## 2019-01-29 LAB — COMPREHENSIVE METABOLIC PANEL
ALT: 14 U/L (ref 0–44)
AST: 11 U/L — ABNORMAL LOW (ref 15–41)
Albumin: 3.7 g/dL (ref 3.5–5.0)
Alkaline Phosphatase: 75 U/L (ref 38–126)
Anion gap: 11 (ref 5–15)
BUN: 30 mg/dL — ABNORMAL HIGH (ref 8–23)
CO2: 25 mmol/L (ref 22–32)
Calcium: 9.4 mg/dL (ref 8.9–10.3)
Chloride: 106 mmol/L (ref 98–111)
Creatinine, Ser: 0.96 mg/dL (ref 0.44–1.00)
GFR calc Af Amer: >60 mL/min (ref >60)
GFR calc non Af Amer: >60 mL/min (ref >60)
Glucose, Bld: 101 mg/dL — ABNORMAL HIGH (ref 70–99)
Potassium: 4.1 mmol/L (ref 3.5–5.1)
Sodium: 142 mmol/L (ref 135–145)
Total Bilirubin: 0.6 mg/dL (ref 0.3–1.2)
Total Protein: 7.7 g/dL (ref 6.5–8.1)

## 2019-01-29 LAB — PROTIME-INR
INR: 1.9 — ABNORMAL HIGH (ref 0.8–1.2)
Prothrombin Time: 21.1 seconds — ABNORMAL HIGH (ref 11.4–15.2)

## 2019-01-29 MED ORDER — LORAZEPAM 2 MG/ML IJ SOLN
1.0000 mg | Freq: Once | INTRAMUSCULAR | Status: AC | PRN
  Administered 2019-01-29: 10:00:00 1 mg via INTRAVENOUS

## 2019-01-29 MED ORDER — TRASTUZUMAB CHEMO 150 MG IV SOLR
750.0000 mg | Freq: Once | INTRAVENOUS | Status: AC
  Administered 2019-01-29: 750 mg via INTRAVENOUS
  Filled 2019-01-29: qty 35.72

## 2019-01-29 MED ORDER — LORAZEPAM 2 MG/ML IJ SOLN
INTRAMUSCULAR | Status: AC
  Filled 2019-01-29: qty 1

## 2019-01-29 MED ORDER — ACETAMINOPHEN 325 MG PO TABS
650.0000 mg | ORAL_TABLET | Freq: Once | ORAL | Status: AC
  Administered 2019-01-29: 650 mg via ORAL

## 2019-01-29 MED ORDER — ACETAMINOPHEN 325 MG PO TABS
ORAL_TABLET | ORAL | Status: AC
  Filled 2019-01-29: qty 2

## 2019-01-29 MED ORDER — HEPARIN SOD (PORK) LOCK FLUSH 100 UNIT/ML IV SOLN
500.0000 [IU] | Freq: Once | INTRAVENOUS | Status: AC | PRN
  Administered 2019-01-29: 500 [IU]
  Filled 2019-01-29: qty 5

## 2019-01-29 MED ORDER — DIPHENHYDRAMINE HCL 25 MG PO CAPS
25.0000 mg | ORAL_CAPSULE | Freq: Once | ORAL | Status: AC
  Administered 2019-01-29: 25 mg via ORAL

## 2019-01-29 MED ORDER — DIPHENHYDRAMINE HCL 25 MG PO CAPS
ORAL_CAPSULE | ORAL | Status: AC
  Filled 2019-01-29: qty 1

## 2019-01-29 MED ORDER — SODIUM CHLORIDE 0.9% FLUSH
10.0000 mL | INTRAVENOUS | Status: DC | PRN
  Administered 2019-01-29: 10 mL
  Filled 2019-01-29: qty 10

## 2019-01-29 MED ORDER — SODIUM CHLORIDE 0.9 % IV SOLN
Freq: Once | INTRAVENOUS | Status: AC
  Administered 2019-01-29: 09:00:00 via INTRAVENOUS
  Filled 2019-01-29: qty 250

## 2019-01-29 NOTE — Patient Instructions (Signed)
Coronavirus (COVID-19) Are you at risk?  Are you at risk for the Coronavirus (COVID-19)?  To be considered HIGH RISK for Coronavirus (COVID-19), you have to meet the following criteria:  . Traveled to [place], [place], [place], [place] or [place]; or in the [place] to [place], [place], [place], or [place]; and have fever, cough, and shortness of breath within the last 2 weeks of travel OR . Been in close contact with a person diagnosed with COVID-19 within the last 2 weeks and have fever, cough, and shortness of breath . IF YOU DO NOT MEET THESE CRITERIA, YOU ARE CONSIDERED LOW RISK FOR COVID-19.  What to do if you are HIGH RISK for COVID-19?  . If you are having a medical emergency, call 911. . Seek medical care right away. Before you go to a doctor's office, urgent care or emergency department, call ahead and tell them about your recent travel, contact with someone diagnosed with COVID-19, and your symptoms. You should receive instructions from your physician's office regarding next steps of care.  . When you arrive at healthcare provider, tell the healthcare staff immediately you have returned from visiting [place], [place], [place], [place] or [place]; or traveled in the [place] to [place], [place], [place], or [place]; in the last two weeks or you have been in close contact with a person diagnosed with COVID-19 in the last 2 weeks.   . Tell the health care staff about your symptoms: fever, cough and shortness of breath. . After you have been seen by a medical provider, you will be either: o Tested for (COVID-19) and discharged home on quarantine except to seek medical care if symptoms worsen, and asked to  - Stay home and avoid contact with others until you get your results (4-5 days)  - Avoid travel on public transportation if possible (such as bus, train, or airplane) or o Sent to the Emergency Department by EMS for evaluation, COVID-19 testing, and possible  admission depending on your condition and test results.  What to do if you are LOW RISK for COVID-19?  Reduce your risk of any infection by using the same precautions used for avoiding the common cold or flu:  . Wash your hands often with soap and warm water for at least 20 seconds.  If soap and water are not readily available, use an alcohol-based hand sanitizer with at least 60% alcohol.  . If coughing or sneezing, cover your mouth and nose by coughing or sneezing into the elbow areas of your shirt or coat, into a tissue or into your sleeve (not your hands). . Avoid shaking hands with others and consider head nods or verbal greetings only. . Avoid touching your eyes, nose, or mouth with unwashed hands.  . Avoid close contact with people who are sick. . Avoid places or events with large numbers of people in one location, like concerts or sporting events. . Carefully consider travel plans you have or are making. . If you are planning any travel outside or inside the [place], visit the CDC's Travelers' Health webpage for the latest health notices. . If you have some symptoms but not all symptoms, continue to monitor at home and seek medical attention if your symptoms worsen. . If you are having a medical emergency, call 911.   ADDITIONAL HEALTHCARE OPTIONS FOR PATIENTS  [facility] / e-Visit: [email or web address]         [facility]: [phone]  [facility]: [phone]                   [facility]: [phone]    [facility] Discharge Instructions for Patients Receiving Chemotherapy  Today you received the following chemotherapy agents Herceptin   To help prevent nausea and vomiting after your treatment, we encourage you to take your nausea medication as directed.    If you develop nausea and vomiting that is not controlled by your nausea medication, call the clinic.   BELOW  ARE SYMPTOMS THAT SHOULD BE REPORTED IMMEDIATELY:  *FEVER GREATER THAN 100.5 F  *CHILLS WITH OR WITHOUT FEVER  NAUSEA AND VOMITING THAT IS NOT CONTROLLED WITH YOUR NAUSEA MEDICATION  *UNUSUAL SHORTNESS OF BREATH  *UNUSUAL BRUISING OR BLEEDING  TENDERNESS IN MOUTH AND THROAT WITH OR WITHOUT PRESENCE OF ULCERS  *URINARY PROBLEMS  *BOWEL PROBLEMS  UNUSUAL RASH Items with * indicate a potential emergency and should be followed up as soon as possible.  Feel free to call the clinic should you have any questions or concerns. The clinic phone number is [phone].  Please show the CHEMO ALERT CARD at check-in to the Emergency Department and triage nurse.   

## 2019-02-05 ENCOUNTER — Other Ambulatory Visit: Payer: Self-pay

## 2019-02-05 DIAGNOSIS — C50919 Malignant neoplasm of unspecified site of unspecified female breast: Secondary | ICD-10-CM

## 2019-02-05 MED ORDER — ALPRAZOLAM 1 MG PO TABS: 1.0000 mg | ORAL_TABLET | Freq: Three times a day (TID) | ORAL | 2 refills | Status: DC | PRN

## 2019-02-05 MED ORDER — TEMAZEPAM 30 MG PO CAPS: ORAL_CAPSULE | ORAL | 2 refills | Status: DC

## 2019-02-22 ENCOUNTER — Other Ambulatory Visit: Payer: Self-pay | Admitting: Oncology

## 2019-02-24 ENCOUNTER — Other Ambulatory Visit (HOSPITAL_COMMUNITY): Payer: Self-pay | Admitting: Cardiology

## 2019-02-26 ENCOUNTER — Inpatient Hospital Stay: Payer: Medicare Other

## 2019-02-26 ENCOUNTER — Inpatient Hospital Stay: Payer: Medicare Other | Attending: Oncology

## 2019-02-26 ENCOUNTER — Other Ambulatory Visit: Payer: Medicare Other

## 2019-02-26 ENCOUNTER — Telehealth (HOSPITAL_COMMUNITY): Payer: Self-pay | Admitting: Cardiology

## 2019-02-26 ENCOUNTER — Other Ambulatory Visit: Payer: Self-pay | Admitting: Oncology

## 2019-02-26 ENCOUNTER — Other Ambulatory Visit: Payer: Self-pay

## 2019-02-26 ENCOUNTER — Ambulatory Visit: Payer: Medicare Other

## 2019-02-26 VITALS — BP 151/70 | HR 61 | Temp 97.4°F | Resp 18 | Ht 63.0 in | Wt 275.0 lb

## 2019-02-26 DIAGNOSIS — D689 Coagulation defect, unspecified: Secondary | ICD-10-CM | POA: Insufficient documentation

## 2019-02-26 DIAGNOSIS — C50812 Malignant neoplasm of overlapping sites of left female breast: Secondary | ICD-10-CM | POA: Diagnosis not present

## 2019-02-26 DIAGNOSIS — C50911 Malignant neoplasm of unspecified site of right female breast: Secondary | ICD-10-CM | POA: Insufficient documentation

## 2019-02-26 DIAGNOSIS — C50912 Malignant neoplasm of unspecified site of left female breast: Secondary | ICD-10-CM

## 2019-02-26 DIAGNOSIS — Z5112 Encounter for antineoplastic immunotherapy: Secondary | ICD-10-CM | POA: Insufficient documentation

## 2019-02-26 DIAGNOSIS — C50919 Malignant neoplasm of unspecified site of unspecified female breast: Secondary | ICD-10-CM

## 2019-02-26 LAB — CBC WITH DIFFERENTIAL/PLATELET
Abs Immature Granulocytes: 0.03 10*3/uL (ref 0.00–0.07)
Basophils Absolute: 0.1 10*3/uL (ref 0.0–0.1)
Basophils Relative: 1 %
Eosinophils Absolute: 0.1 10*3/uL (ref 0.0–0.5)
Eosinophils Relative: 1 %
HCT: 32.8 % — ABNORMAL LOW (ref 36.0–46.0)
Hemoglobin: 11.1 g/dL — ABNORMAL LOW (ref 12.0–15.0)
Immature Granulocytes: 0 %
Lymphocytes Relative: 22 %
Lymphs Abs: 1.9 10*3/uL (ref 0.7–4.0)
MCH: 29.3 pg (ref 26.0–34.0)
MCHC: 33.8 g/dL (ref 30.0–36.0)
MCV: 86.5 fL (ref 80.0–100.0)
Monocytes Absolute: 0.7 10*3/uL (ref 0.1–1.0)
Monocytes Relative: 8 %
Neutro Abs: 5.8 10*3/uL (ref 1.7–7.7)
Neutrophils Relative %: 68 %
Platelets: 276 10*3/uL (ref 150–400)
RBC: 3.79 MIL/uL — ABNORMAL LOW (ref 3.87–5.11)
RDW: 14.4 % (ref 11.5–15.5)
WBC: 8.6 10*3/uL (ref 4.0–10.5)
nRBC: 0 % (ref 0.0–0.2)

## 2019-02-26 LAB — CMP (CANCER CENTER ONLY)
ALT: 11 U/L (ref 0–44)
AST: 11 U/L — ABNORMAL LOW (ref 15–41)
Albumin: 3.6 g/dL (ref 3.5–5.0)
Alkaline Phosphatase: 71 U/L (ref 38–126)
Anion gap: 10 (ref 5–15)
BUN: 31 mg/dL — ABNORMAL HIGH (ref 8–23)
CO2: 24 mmol/L (ref 22–32)
Calcium: 9 mg/dL (ref 8.9–10.3)
Chloride: 106 mmol/L (ref 98–111)
Creatinine: 1.18 mg/dL — ABNORMAL HIGH (ref 0.44–1.00)
GFR, Est AFR Am: 55 mL/min — ABNORMAL LOW (ref >60)
GFR, Estimated: 48 mL/min — ABNORMAL LOW (ref >60)
Glucose, Bld: 83 mg/dL (ref 70–99)
Potassium: 4.1 mmol/L (ref 3.5–5.1)
Sodium: 140 mmol/L (ref 135–145)
Total Bilirubin: 0.4 mg/dL (ref 0.3–1.2)
Total Protein: 7.5 g/dL (ref 6.5–8.1)

## 2019-02-26 LAB — PROTIME-INR
INR: 2.9 — ABNORMAL HIGH (ref 0.8–1.2)
Prothrombin Time: 29.8 seconds — ABNORMAL HIGH (ref 11.4–15.2)

## 2019-02-26 MED ORDER — HEPARIN SOD (PORK) LOCK FLUSH 100 UNIT/ML IV SOLN
500.0000 [IU] | Freq: Once | INTRAVENOUS | Status: AC | PRN
  Administered 2019-02-26: 500 [IU]
  Filled 2019-02-26: qty 5

## 2019-02-26 MED ORDER — SODIUM CHLORIDE 0.9 % IV SOLN
Freq: Once | INTRAVENOUS | Status: AC
  Administered 2019-02-26: 09:00:00 via INTRAVENOUS
  Filled 2019-02-26: qty 250

## 2019-02-26 MED ORDER — SODIUM CHLORIDE 0.9% FLUSH
10.0000 mL | INTRAVENOUS | Status: DC | PRN
  Administered 2019-02-26: 10 mL
  Filled 2019-02-26: qty 10

## 2019-02-26 MED ORDER — LORAZEPAM 2 MG/ML IJ SOLN
INTRAMUSCULAR | Status: AC
  Filled 2019-02-26: qty 1

## 2019-02-26 MED ORDER — TRASTUZUMAB CHEMO 150 MG IV SOLR
750.0000 mg | Freq: Once | INTRAVENOUS | Status: AC
  Administered 2019-02-26: 750 mg via INTRAVENOUS
  Filled 2019-02-26: qty 35.72

## 2019-02-26 MED ORDER — SODIUM CHLORIDE 0.9% FLUSH
10.0000 mL | INTRAVENOUS | Status: AC | PRN
  Filled 2019-02-26: qty 10

## 2019-02-26 MED ORDER — DIPHENHYDRAMINE HCL 25 MG PO CAPS
25.0000 mg | ORAL_CAPSULE | Freq: Once | ORAL | Status: AC
  Administered 2019-02-26: 25 mg via ORAL

## 2019-02-26 MED ORDER — LORAZEPAM 2 MG/ML IJ SOLN
1.0000 mg | Freq: Once | INTRAMUSCULAR | Status: AC | PRN
  Administered 2019-02-26: 1 mg via INTRAVENOUS

## 2019-02-26 MED ORDER — ACETAMINOPHEN 325 MG PO TABS
650.0000 mg | ORAL_TABLET | Freq: Once | ORAL | Status: AC
  Administered 2019-02-26: 650 mg via ORAL

## 2019-02-26 MED ORDER — ACETAMINOPHEN 325 MG PO TABS
ORAL_TABLET | ORAL | Status: AC
  Filled 2019-02-26: qty 2

## 2019-02-26 MED ORDER — DIPHENHYDRAMINE HCL 25 MG PO CAPS
ORAL_CAPSULE | ORAL | Status: AC
  Filled 2019-02-26: qty 1

## 2019-02-26 NOTE — Telephone Encounter (Signed)
COVID Screening completed. ° °-GSM °

## 2019-02-26 NOTE — Patient Instructions (Signed)
Coronavirus (COVID-19) Are you at risk?  Are you at risk for the Coronavirus (COVID-19)?  To be considered HIGH RISK for Coronavirus (COVID-19), you have to meet the following criteria:  . Traveled to [place], [place], [place], [place] or [place]; or in the [place] to [place], [place], [place], or [place]; and have fever, cough, and shortness of breath within the last 2 weeks of travel OR . Been in close contact with a person diagnosed with COVID-19 within the last 2 weeks and have fever, cough, and shortness of breath . IF YOU DO NOT MEET THESE CRITERIA, YOU ARE CONSIDERED LOW RISK FOR COVID-19.  What to do if you are HIGH RISK for COVID-19?  . If you are having a medical emergency, call 911. . Seek medical care right away. Before you go to a doctor's office, urgent care or emergency department, call ahead and tell them about your recent travel, contact with someone diagnosed with COVID-19, and your symptoms. You should receive instructions from your physician's office regarding next steps of care.  . When you arrive at healthcare provider, tell the healthcare staff immediately you have returned from visiting [place], [place], [place], [place] or [place]; or traveled in the [place] to [place], [place], [place], or [place]; in the last two weeks or you have been in close contact with a person diagnosed with COVID-19 in the last 2 weeks.   . Tell the health care staff about your symptoms: fever, cough and shortness of breath. . After you have been seen by a medical provider, you will be either: o Tested for (COVID-19) and discharged home on quarantine except to seek medical care if symptoms worsen, and asked to  - Stay home and avoid contact with others until you get your results (4-5 days)  - Avoid travel on public transportation if possible (such as bus, train, or airplane) or o Sent to the Emergency Department by EMS for evaluation, COVID-19 testing, and possible  admission depending on your condition and test results.  What to do if you are LOW RISK for COVID-19?  Reduce your risk of any infection by using the same precautions used for avoiding the common cold or flu:  . Wash your hands often with soap and warm water for at least 20 seconds.  If soap and water are not readily available, use an alcohol-based hand sanitizer with at least 60% alcohol.  . If coughing or sneezing, cover your mouth and nose by coughing or sneezing into the elbow areas of your shirt or coat, into a tissue or into your sleeve (not your hands). . Avoid shaking hands with others and consider head nods or verbal greetings only. . Avoid touching your eyes, nose, or mouth with unwashed hands.  . Avoid close contact with people who are sick. . Avoid places or events with large numbers of people in one location, like concerts or sporting events. . Carefully consider travel plans you have or are making. . If you are planning any travel outside or inside the [place], visit the CDC's Travelers' Health webpage for the latest health notices. . If you have some symptoms but not all symptoms, continue to monitor at home and seek medical attention if your symptoms worsen. . If you are having a medical emergency, call 911.   ADDITIONAL HEALTHCARE OPTIONS FOR PATIENTS  [place] [facility] / e-Visit: [email or web address]         [facility]: [phone]  [facility]: [phone]                   [facility]: [phone]    [facility] Discharge Instructions for Patients Receiving Chemotherapy  Today you received the following chemotherapy agents Herceptin   To help prevent nausea and vomiting after your treatment, we encourage you to take your nausea medication as directed.    If you develop nausea and vomiting that is not controlled by your nausea medication, call the clinic.   BELOW  ARE SYMPTOMS THAT SHOULD BE REPORTED IMMEDIATELY:  *FEVER GREATER THAN 100.5 F  *CHILLS WITH OR WITHOUT FEVER  NAUSEA AND VOMITING THAT IS NOT CONTROLLED WITH YOUR NAUSEA MEDICATION  *UNUSUAL SHORTNESS OF BREATH  *UNUSUAL BRUISING OR BLEEDING  TENDERNESS IN MOUTH AND THROAT WITH OR WITHOUT PRESENCE OF ULCERS  *URINARY PROBLEMS  *BOWEL PROBLEMS  UNUSUAL RASH Items with * indicate a potential emergency and should be followed up as soon as possible.  Feel free to call the clinic should you have any questions or concerns. The clinic phone number is [phone].  Please show the CHEMO ALERT CARD at check-in to the Emergency Department and triage nurse.   

## 2019-03-02 ENCOUNTER — Encounter (HOSPITAL_COMMUNITY): Payer: Self-pay | Admitting: Cardiology

## 2019-03-02 ENCOUNTER — Ambulatory Visit (HOSPITAL_BASED_OUTPATIENT_CLINIC_OR_DEPARTMENT_OTHER)
Admission: RE | Admit: 2019-03-02 | Discharge: 2019-03-02 | Disposition: A | Discharge status: Home / Self Care | Payer: Medicare Other | Source: Ambulatory Visit | Attending: Cardiology | Admitting: Cardiology

## 2019-03-02 ENCOUNTER — Other Ambulatory Visit: Payer: Self-pay

## 2019-03-02 ENCOUNTER — Ambulatory Visit (HOSPITAL_COMMUNITY)
Admission: RE | Admit: 2019-03-02 | Discharge: 2019-03-02 | Disposition: A | Discharge status: Home / Self Care | Payer: Medicare Other | Source: Ambulatory Visit | Attending: Cardiology | Admitting: Cardiology

## 2019-03-02 VITALS — BP 160/100 | HR 63 | Wt 279.2 lb

## 2019-03-02 DIAGNOSIS — E669 Obesity, unspecified: Secondary | ICD-10-CM | POA: Insufficient documentation

## 2019-03-02 DIAGNOSIS — Z923 Personal history of irradiation: Secondary | ICD-10-CM | POA: Diagnosis not present

## 2019-03-02 DIAGNOSIS — Z8249 Family history of ischemic heart disease and other diseases of the circulatory system: Secondary | ICD-10-CM | POA: Diagnosis not present

## 2019-03-02 DIAGNOSIS — R0609 Other forms of dyspnea: Secondary | ICD-10-CM | POA: Diagnosis not present

## 2019-03-02 DIAGNOSIS — C50919 Malignant neoplasm of unspecified site of unspecified female breast: Secondary | ICD-10-CM

## 2019-03-02 DIAGNOSIS — I110 Hypertensive heart disease with heart failure: Secondary | ICD-10-CM | POA: Diagnosis not present

## 2019-03-02 DIAGNOSIS — G629 Polyneuropathy, unspecified: Secondary | ICD-10-CM | POA: Insufficient documentation

## 2019-03-02 DIAGNOSIS — M25561 Pain in right knee: Secondary | ICD-10-CM | POA: Diagnosis not present

## 2019-03-02 DIAGNOSIS — Z79899 Other long term (current) drug therapy: Secondary | ICD-10-CM | POA: Diagnosis not present

## 2019-03-02 DIAGNOSIS — C50911 Malignant neoplasm of unspecified site of right female breast: Secondary | ICD-10-CM | POA: Diagnosis not present

## 2019-03-02 DIAGNOSIS — Z9221 Personal history of antineoplastic chemotherapy: Secondary | ICD-10-CM | POA: Insufficient documentation

## 2019-03-02 DIAGNOSIS — I871 Compression of vein: Secondary | ICD-10-CM | POA: Insufficient documentation

## 2019-03-02 DIAGNOSIS — M25562 Pain in left knee: Secondary | ICD-10-CM | POA: Diagnosis not present

## 2019-03-02 DIAGNOSIS — Z6841 Body Mass Index (BMI) 40.0 and over, adult: Secondary | ICD-10-CM | POA: Insufficient documentation

## 2019-03-02 DIAGNOSIS — I10 Essential (primary) hypertension: Secondary | ICD-10-CM

## 2019-03-02 DIAGNOSIS — Z7901 Long term (current) use of anticoagulants: Secondary | ICD-10-CM | POA: Diagnosis not present

## 2019-03-02 DIAGNOSIS — I5032 Chronic diastolic (congestive) heart failure: Secondary | ICD-10-CM

## 2019-03-02 DIAGNOSIS — I340 Nonrheumatic mitral (valve) insufficiency: Secondary | ICD-10-CM | POA: Insufficient documentation

## 2019-03-02 DIAGNOSIS — Z9013 Acquired absence of bilateral breasts and nipples: Secondary | ICD-10-CM | POA: Insufficient documentation

## 2019-03-02 LAB — LIPID PANEL
Cholesterol: 216 mg/dL — ABNORMAL HIGH (ref 0–200)
HDL: 95 mg/dL (ref >40)
LDL Cholesterol: 112 mg/dL — ABNORMAL HIGH (ref 0–99)
Total CHOL/HDL Ratio: 2.3 RATIO
Triglycerides: 43 mg/dL (ref <150)
VLDL: 9 mg/dL (ref 0–40)

## 2019-03-02 MED ORDER — CARVEDILOL 12.5 MG PO TABS: 12.5000 mg | ORAL_TABLET | Freq: Two times a day (BID) | ORAL | 6 refills | Status: DC

## 2019-03-02 NOTE — Progress Notes (Signed)
Advanced Heart Failure Clinic Note   Patient ID: ANIE JUNIEL, female   DOB: September 26, 1950, 68 y.o.   MRN: [id number] Oncologist: Dr Jana Hakim  HPI: Mrs Azer is a 68 y.o. with a history of metastatic HER-2 positive breast carcinoma originally diagnosed September 2004. Started in R breast. Underwent neo-adjuvant chemo. During this time developed L breast CA. Underwent bilateral mastectomy. Lymph nodes +. 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   She received a total of 6 cycles of Taxotere/Carbo/Herceptin, completed in April 2005, after which she began single agent Herceptin, given every 4 weeks now. Plan to continue on Herceptin indefinitely.   She returns for cardio-oncology followup.  BP high today but she has not taken any of her medications yet. She says that BP runs 130s/70s when she has taken her BP meds.  Weight is stable.  Main complaint is bilateral knee pain.  Dyspnea with yardwork, generally no problems walking on flat ground.  No chest pain.  Of note, her father and brothers all had MIs.  She is concerned about coronary event risk.  Labs 11/15: K 4, creatinine 1.0   Labs 1/16: K 4.1, creatinine 1.0 Labs 8/16: K 4.3, creatinine 0.9 Labs 11/16: K 3.9, creatinine 0.9 Labs 5/17: K 3.9, creatinine 0.68 Labs 5/18: K 4, creatinine 0.65 Labs 1/19: K 4.1, creatinine 1.23 Labs 7/19: K 3.9, creatinine 1.11 Labs 1/20: K 3.8, creatinine 0.83 Labs 7/20: K 4.1, creatinine 1.18  04/23/12 ECHO EF 60-65% Lateral s' 8.9 cm/s  07/30/12 ECHO EF 60-65% Lateral s' 8.3 cm/s  09/11/12 ECHO EF 60-65% Lateral s' 10.3 cm/s  12/22/12 ECHO 55-60% Lateral S' 9.8 RV mildly dilated  07/01/13 ECHO 55-60%, lateral s' 9.79, mild RV dilation, grade II DD 3/15 ECHO 55%, mild MR, lateral s' 9.6, GLS -19.2% 03/02/14 ECHO EF 55-60% Lateral S' 9.4 GLS - 21.9  11/15 ECHO EF 60-65%, lateral S' 6.8, GLS -17.2%, mild RV dilation with normal systolic function, mild MR.  2/16  ECHO EF 60-65%, lateral S' 14.4, GLS -18.2%, normal diastolic function, mild RV dilation with normal RV systolic function.  8/16 ECHO EF 60-65%, mild LVH, normal RV size and systolic function, lateral s' 13.2, GLS -17%.  11/16 ECHO EF 60-65% Lateral S' 10.2, GLS -20.2% 5/17 ECHO EF 60-65%, mild LVH, lateral s' 12.2, GLS -20.8%, grade II diastolic dysfunction, normal RV. 9/17 ECHO EF 99-37%, normal diastolic function, GLS -16.9% 12/17 ECHO EF 55-60%, moderate diastolic dysfunction, GLS -20.1%, normal RV size and systolic function 6/78 ECHO EF 55-60%, GLS -93.8%, normal diastolic function, normal RV size and systolic function.  1/19 ECHO EF 55-60%, GLS -10.1%, normal diastolic function, RV normal size and systolic function.  7/19 ECHO EF 60-65%, GLS -22.1%.  1/20 ECHO EF 75-10%, normal diastolic function, normal RV, strain not done.  6/20 ECHO EF 25-85%, normal diastolic function, normal RV, GLS -24.4%   ROS: All systems negative except as listed in HPI, PMH and Problem List.  Past Medical History:  Diagnosis Date  . Breast cancer ([place])    mets to liver and lung  . Breast cancer metastasized to multiple sites ([place]) 02/26/2013  . History of chemotherapy Feb. 2006   taxotere/herceptin/carboplatin  . Hypertension   . Neuropathy   . Radiation 07/31/2006   left upper chest  . Radiation 06/17/2006-06/27/2006   6480 cGy bilat. chest wall  . SVC syndrome   . Thrombosis     Current Outpatient Medications  Medication Sig Dispense Refill  .  acetaminophen (TYLENOL) 500 MG tablet Take 1,000 mg by mouth every 6 (six) hours as needed for mild pain or fever.     Marland Kitchen albuterol (PROVENTIL HFA;VENTOLIN HFA) 108 (90 BASE) MCG/ACT inhaler Inhale 2 puffs into the lungs every 6 (six) hours as needed for wheezing. 1 Inhaler 5  . ALPRAZolam (XANAX) 1 MG tablet Take 1 tablet (1 mg total) by mouth 3 (three) times daily as needed. for anxiety 60 tablet 2  . amLODipine (NORVASC) 10 MG tablet Take 10 mg by mouth every  morning.    . baclofen (LIORESAL) 10 MG tablet TAKE 1 TABLET BY MOUTH THREE TIMES DAILY AS NEEDED FOR MUSCLE SPASMS 270 tablet 0  . carvedilol (COREG) 12.5 MG tablet Take 1 tablet (12.5 mg total) by mouth 2 (two) times daily. 60 tablet 6  . diclofenac sodium (VOLTAREN) 1 % GEL Apply 2 g topically daily as needed (for pain). Apply to knees and shoulders 100 g 6  . furosemide (LASIX) 40 MG tablet TAKE 1 TABLET(40 MG) BY MOUTH DAILY 30 tablet 3  . gabapentin (NEURONTIN) 300 MG capsule TAKE 2 CAPSULES(600 MG) BY MOUTH THREE TIMES DAILY 540 capsule 0  . losartan (COZAAR) 100 MG tablet Take 100 mg by mouth every morning.    Marland Kitchen oxyCODONE-acetaminophen (PERCOCET) 10-325 MG tablet TK 1 T PO Q 6 H PRN    . potassium chloride (K-DUR,KLOR-CON) 20 MEQ tablet Take 1 tablet (20 mEq total) by mouth daily. 30 tablet 3  . predniSONE (DELTASONE) 5 MG tablet     . spironolactone (ALDACTONE) 25 MG tablet TAKE 1 TABLET(25 MG) BY MOUTH DAILY 30 tablet 5  . temazepam (RESTORIL) 30 MG capsule TAKE ONE CAPSULE BY MOUTH EVERY NIGHT AT BEDTIME AS NEEDED FOR SLEEP 30 capsule 2  . warfarin (COUMADIN) 5 MG tablet Take 5 mg by mouth daily.     No current facility-administered medications for this encounter.    Facility-Administered Medications Ordered in Other Encounters  Medication Dose Route Frequency Provider Last Rate Last Dose  . sodium chloride flush (NS) 0.9 % injection 10 mL  10 mL Intravenous PRN Magrinat, Virgie Dad, MD   10 mL at 12/15/15 1200  . sodium chloride flush (NS) 0.9 % injection 10 mL  10 mL Intracatheter PRN Magrinat, Virgie Dad, MD   10 mL at 08/14/18 1116  . sodium chloride flush (NS) 0.9 % injection 10 mL  10 mL Intracatheter PRN Magrinat, Virgie Dad, MD        Vitals:   03/02/19 1012  BP: (!) 160/100  Pulse: 63  SpO2: 97%  Weight: 126.6 kg (279 lb 3.2 oz)    PHYSICAL EXAM: General: NAD, obese.  Neck: No JVD, no thyromegaly or thyroid nodule.  Lungs: Clear to auscultation bilaterally with normal  respiratory effort. CV: Nondisplaced PMI.  Heart regular S1/S2, no S3/S4, no murmur.  No peripheral edema.  No carotid bruit.  Normal pedal pulses.  Abdomen: Soft, nontender, no hepatosplenomegaly, no distention.  Skin: Intact without lesions or rashes.  Neurologic: Alert and oriented x 3.  Psych: Normal affect. Extremities: No clubbing or cyanosis.  HEENT: Normal.   ASSESSMENT & PLAN: 1) L Breast Cancer s/p bilateral mastectomies:  Symptomatically stable. Echo reviewed today, EF and strain parameters appear normal.  She will be continuing Herceptin indefinitely, now getting every 4 wks.   - Repeat echo in 6 months.   2) Suspected OSA: She has not wanted to do a sleep study.    3) HTN: BP  high today but has not taken her meds yet.  Long-term, would like to see SBP < 130.  - Increase Coreg to 12.5 mg bid.    4) Chronic diastolic CHF: She does not appear volume overloaded.  - Continue Lasix twice a week.  5) SVC syndrome: Continue warfarin. 6) Coronary disease risk: She has a strong FH of coronary disease, as well as HTN.  No chest pain but she has exertional dyspnea.  - No ASA given warfarin use.  - Check lipids.  - I will arrange for coronary artery calcium score for risk stratification.    Followup 6 months with echo.   Loralie Champagne 03/02/2019

## 2019-03-02 NOTE — Patient Instructions (Addendum)
Increase Carvedilol to 12.5 mg Twice daily   We will try to get the Cardiac CT scan approved with your insurance company  We will contact you in 6 months to schedule your next appointment and echocardiogram  At the [facility], you and your health needs are our priority. As part of our continuing mission to provide you with exceptional heart care, we have created designated Provider Care Teams. These Care Teams include your primary Cardiologist (physician) and Advanced Practice Providers (APPs- Physician Assistants and Nurse Practitioners) who all work together to provide you with the care you need, when you need it.   You may see any of the following providers on your designated Care Team at your next follow up: Marland Kitchen Dr Glori Bickers . Dr Loralie Champagne . Darrick Grinder, NP

## 2019-03-02 NOTE — Progress Notes (Signed)
  Echocardiogram 2D Echocardiogram has been performed.  Donnah Levert L Androw 03/02/2019, 9:55 AM

## 2019-03-10 ENCOUNTER — Other Ambulatory Visit (HOSPITAL_COMMUNITY): Payer: Self-pay

## 2019-03-10 DIAGNOSIS — I428 Other cardiomyopathies: Secondary | ICD-10-CM

## 2019-03-23 ENCOUNTER — Other Ambulatory Visit: Payer: Self-pay | Admitting: *Deleted

## 2019-03-23 DIAGNOSIS — C50919 Malignant neoplasm of unspecified site of unspecified female breast: Secondary | ICD-10-CM

## 2019-03-26 ENCOUNTER — Other Ambulatory Visit: Payer: Self-pay

## 2019-03-26 ENCOUNTER — Inpatient Hospital Stay: Payer: Medicare Other

## 2019-03-26 ENCOUNTER — Ambulatory Visit: Payer: Medicare Other

## 2019-03-26 ENCOUNTER — Other Ambulatory Visit: Payer: Medicare Other

## 2019-03-26 VITALS — BP 151/88 | HR 60 | Temp 97.8°F | Resp 12

## 2019-03-26 DIAGNOSIS — C50911 Malignant neoplasm of unspecified site of right female breast: Secondary | ICD-10-CM | POA: Diagnosis not present

## 2019-03-26 DIAGNOSIS — C50812 Malignant neoplasm of overlapping sites of left female breast: Secondary | ICD-10-CM | POA: Diagnosis not present

## 2019-03-26 DIAGNOSIS — C50312 Malignant neoplasm of lower-inner quadrant of left female breast: Secondary | ICD-10-CM

## 2019-03-26 DIAGNOSIS — D689 Coagulation defect, unspecified: Secondary | ICD-10-CM | POA: Diagnosis not present

## 2019-03-26 DIAGNOSIS — Z5112 Encounter for antineoplastic immunotherapy: Secondary | ICD-10-CM | POA: Diagnosis not present

## 2019-03-26 DIAGNOSIS — C50311 Malignant neoplasm of lower-inner quadrant of right female breast: Secondary | ICD-10-CM

## 2019-03-26 DIAGNOSIS — Z95828 Presence of other vascular implants and grafts: Secondary | ICD-10-CM

## 2019-03-26 DIAGNOSIS — C50919 Malignant neoplasm of unspecified site of unspecified female breast: Secondary | ICD-10-CM

## 2019-03-26 LAB — CBC WITH DIFFERENTIAL/PLATELET
Abs Immature Granulocytes: 0.05 10*3/uL (ref 0.00–0.07)
Basophils Absolute: 0.1 10*3/uL (ref 0.0–0.1)
Basophils Relative: 1 %
Eosinophils Absolute: 0 10*3/uL (ref 0.0–0.5)
Eosinophils Relative: 0 %
HCT: 31.9 % — ABNORMAL LOW (ref 36.0–46.0)
Hemoglobin: 10.8 g/dL — ABNORMAL LOW (ref 12.0–15.0)
Immature Granulocytes: 1 %
Lymphocytes Relative: 19 %
Lymphs Abs: 1.7 10*3/uL (ref 0.7–4.0)
MCH: 28.9 pg (ref 26.0–34.0)
MCHC: 33.9 g/dL (ref 30.0–36.0)
MCV: 85.3 fL (ref 80.0–100.0)
Monocytes Absolute: 0.5 10*3/uL (ref 0.1–1.0)
Monocytes Relative: 5 %
Neutro Abs: 6.6 10*3/uL (ref 1.7–7.7)
Neutrophils Relative %: 74 %
Platelets: 255 10*3/uL (ref 150–400)
RBC: 3.74 MIL/uL — ABNORMAL LOW (ref 3.87–5.11)
RDW: 14.3 % (ref 11.5–15.5)
WBC: 8.9 10*3/uL (ref 4.0–10.5)
nRBC: 0 % (ref 0.0–0.2)

## 2019-03-26 LAB — COMPREHENSIVE METABOLIC PANEL
ALT: 10 U/L (ref 0–44)
AST: 10 U/L — ABNORMAL LOW (ref 15–41)
Albumin: 3.4 g/dL — ABNORMAL LOW (ref 3.5–5.0)
Alkaline Phosphatase: 70 U/L (ref 38–126)
Anion gap: 9 (ref 5–15)
BUN: 17 mg/dL (ref 8–23)
CO2: 26 mmol/L (ref 22–32)
Calcium: 9.3 mg/dL (ref 8.9–10.3)
Chloride: 107 mmol/L (ref 98–111)
Creatinine, Ser: 0.88 mg/dL (ref 0.44–1.00)
GFR calc Af Amer: >60 mL/min (ref >60)
GFR calc non Af Amer: >60 mL/min (ref >60)
Glucose, Bld: 99 mg/dL (ref 70–99)
Potassium: 4.1 mmol/L (ref 3.5–5.1)
Sodium: 142 mmol/L (ref 135–145)
Total Bilirubin: 0.5 mg/dL (ref 0.3–1.2)
Total Protein: 7.2 g/dL (ref 6.5–8.1)

## 2019-03-26 LAB — PROTIME-INR
INR: 2.4 — ABNORMAL HIGH (ref 0.8–1.2)
Prothrombin Time: 26 seconds — ABNORMAL HIGH (ref 11.4–15.2)

## 2019-03-26 MED ORDER — DIPHENHYDRAMINE HCL 25 MG PO CAPS
25.0000 mg | ORAL_CAPSULE | Freq: Once | ORAL | Status: AC
  Administered 2019-03-26: 25 mg via ORAL

## 2019-03-26 MED ORDER — SODIUM CHLORIDE 0.9 % IV SOLN
Freq: Once | INTRAVENOUS | Status: AC
  Administered 2019-03-26: 09:00:00 via INTRAVENOUS
  Filled 2019-03-26: qty 250

## 2019-03-26 MED ORDER — LORAZEPAM 2 MG/ML IJ SOLN
INTRAMUSCULAR | Status: AC
  Filled 2019-03-26: qty 1

## 2019-03-26 MED ORDER — ACETAMINOPHEN 325 MG PO TABS
650.0000 mg | ORAL_TABLET | Freq: Once | ORAL | Status: AC
  Administered 2019-03-26: 650 mg via ORAL

## 2019-03-26 MED ORDER — HEPARIN SOD (PORK) LOCK FLUSH 100 UNIT/ML IV SOLN
500.0000 [IU] | Freq: Once | INTRAVENOUS | Status: AC | PRN
  Administered 2019-03-26: 500 [IU]
  Filled 2019-03-26: qty 5

## 2019-03-26 MED ORDER — SODIUM CHLORIDE 0.9% FLUSH
10.0000 mL | INTRAVENOUS | Status: DC | PRN
  Administered 2019-03-26: 10 mL
  Filled 2019-03-26: qty 10

## 2019-03-26 MED ORDER — ACETAMINOPHEN 325 MG PO TABS
ORAL_TABLET | ORAL | Status: AC
  Filled 2019-03-26: qty 2

## 2019-03-26 MED ORDER — DIPHENHYDRAMINE HCL 25 MG PO CAPS
ORAL_CAPSULE | ORAL | Status: AC
  Filled 2019-03-26: qty 1

## 2019-03-26 MED ORDER — LORAZEPAM 2 MG/ML IJ SOLN
1.0000 mg | Freq: Once | INTRAMUSCULAR | Status: AC | PRN
  Administered 2019-03-26: 1 mg via INTRAVENOUS

## 2019-03-26 MED ORDER — SODIUM CHLORIDE 0.9% FLUSH
10.0000 mL | INTRAVENOUS | Status: DC | PRN
  Administered 2019-03-26: 08:00:00 10 mL
  Filled 2019-03-26: qty 10

## 2019-03-26 MED ORDER — TRASTUZUMAB CHEMO 150 MG IV SOLR
750.0000 mg | Freq: Once | INTRAVENOUS | Status: AC
  Administered 2019-03-26: 750 mg via INTRAVENOUS
  Filled 2019-03-26: qty 35.72

## 2019-03-26 NOTE — Patient Instructions (Signed)
Coronavirus (COVID-19) Are you at risk?  Are you at risk for the Coronavirus (COVID-19)?  To be considered HIGH RISK for Coronavirus (COVID-19), you have to meet the following criteria:  . Traveled to [place], [place], [place], [place] or [place]; or in the [place] to [place], [place], [place], or [place]; and have fever, cough, and shortness of breath within the last 2 weeks of travel OR . Been in close contact with a person diagnosed with COVID-19 within the last 2 weeks and have fever, cough, and shortness of breath . IF YOU DO NOT MEET THESE CRITERIA, YOU ARE CONSIDERED LOW RISK FOR COVID-19.  What to do if you are HIGH RISK for COVID-19?  . If you are having a medical emergency, call 911. . Seek medical care right away. Before you go to a doctor's office, urgent care or emergency department, call ahead and tell them about your recent travel, contact with someone diagnosed with COVID-19, and your symptoms. You should receive instructions from your physician's office regarding next steps of care.  . When you arrive at healthcare provider, tell the healthcare staff immediately you have returned from visiting [place], [place], [place], [place] or [place]; or traveled in the [place] to [place], [place], [place], or [place]; in the last two weeks or you have been in close contact with a person diagnosed with COVID-19 in the last 2 weeks.   . Tell the health care staff about your symptoms: fever, cough and shortness of breath. . After you have been seen by a medical provider, you will be either: o Tested for (COVID-19) and discharged home on quarantine except to seek medical care if symptoms worsen, and asked to  - Stay home and avoid contact with others until you get your results (4-5 days)  - Avoid travel on public transportation if possible (such as bus, train, or airplane) or o Sent to the Emergency Department by EMS for evaluation, COVID-19 testing, and possible  admission depending on your condition and test results.  What to do if you are LOW RISK for COVID-19?  Reduce your risk of any infection by using the same precautions used for avoiding the common cold or flu:  . Wash your hands often with soap and warm water for at least 20 seconds.  If soap and water are not readily available, use an alcohol-based hand sanitizer with at least 60% alcohol.  . If coughing or sneezing, cover your mouth and nose by coughing or sneezing into the elbow areas of your shirt or coat, into a tissue or into your sleeve (not your hands). . Avoid shaking hands with others and consider head nods or verbal greetings only. . Avoid touching your eyes, nose, or mouth with unwashed hands.  . Avoid close contact with people who are sick. . Avoid places or events with large numbers of people in one location, like concerts or sporting events. . Carefully consider travel plans you have or are making. . If you are planning any travel outside or inside the [place], visit the CDC's Travelers' Health webpage for the latest health notices. . If you have some symptoms but not all symptoms, continue to monitor at home and seek medical attention if your symptoms worsen. . If you are having a medical emergency, call 911.   ADDITIONAL HEALTHCARE OPTIONS FOR PATIENTS  [facility] / e-Visit: [email or web address]         [facility]: [phone]  [facility]: [phone]                   [facility]: [phone]    [facility] Discharge Instructions for Patients Receiving Chemotherapy  Today you received the following chemotherapy agents Herceptin   To help prevent nausea and vomiting after your treatment, we encourage you to take your nausea medication as directed.    If you develop nausea and vomiting that is not controlled by your nausea medication, call the clinic.   BELOW  ARE SYMPTOMS THAT SHOULD BE REPORTED IMMEDIATELY:  *FEVER GREATER THAN 100.5 F  *CHILLS WITH OR WITHOUT FEVER  NAUSEA AND VOMITING THAT IS NOT CONTROLLED WITH YOUR NAUSEA MEDICATION  *UNUSUAL SHORTNESS OF BREATH  *UNUSUAL BRUISING OR BLEEDING  TENDERNESS IN MOUTH AND THROAT WITH OR WITHOUT PRESENCE OF ULCERS  *URINARY PROBLEMS  *BOWEL PROBLEMS  UNUSUAL RASH Items with * indicate a potential emergency and should be followed up as soon as possible.  Feel free to call the clinic should you have any questions or concerns. The clinic phone number is [phone].  Please show the CHEMO ALERT CARD at check-in to the Emergency Department and triage nurse.   

## 2019-03-31 ENCOUNTER — Encounter (HOSPITAL_COMMUNITY): Payer: Self-pay | Admitting: Emergency Medicine

## 2019-03-31 NOTE — Progress Notes (Signed)
The following biosimilar Ogivri (trastuzumab-[id number]) has been selected for use in this patient.  Kennith Center, Pharm.D., CPP 03/31/2019@4 :13 PM

## 2019-04-01 ENCOUNTER — Telehealth (HOSPITAL_COMMUNITY): Payer: Self-pay | Admitting: Emergency Medicine

## 2019-04-01 NOTE — Telephone Encounter (Signed)
Left message on voicemail with name and callback number Yukio Bisping RN Navigator Cardiac Imaging [facility] [phone] Office [phone] Cell  

## 2019-04-02 ENCOUNTER — Ambulatory Visit (HOSPITAL_COMMUNITY): Payer: Medicare Other

## 2019-04-02 ENCOUNTER — Ambulatory Visit (HOSPITAL_COMMUNITY): Admission: RE | Admit: 2019-04-02 | Payer: Medicare Other | Source: Ambulatory Visit

## 2019-04-08 ENCOUNTER — Telehealth (HOSPITAL_COMMUNITY): Payer: Self-pay | Admitting: Emergency Medicine

## 2019-04-08 NOTE — Telephone Encounter (Signed)
Left message on voicemail with name and callback number Mckinnley Smithey RN Navigator Cardiac Imaging [facility] [phone] Office [phone] Cell  

## 2019-04-09 ENCOUNTER — Ambulatory Visit (HOSPITAL_COMMUNITY): Payer: Medicare Other

## 2019-04-09 ENCOUNTER — Other Ambulatory Visit: Payer: Self-pay

## 2019-04-09 ENCOUNTER — Ambulatory Visit (HOSPITAL_COMMUNITY)
Admission: RE | Admit: 2019-04-09 | Discharge: 2019-04-09 | Disposition: A | Discharge status: Home / Self Care | Payer: Medicare Other | Source: Ambulatory Visit | Attending: Cardiology | Admitting: Cardiology

## 2019-04-09 DIAGNOSIS — I428 Other cardiomyopathies: Secondary | ICD-10-CM | POA: Diagnosis not present

## 2019-04-09 DIAGNOSIS — R079 Chest pain, unspecified: Secondary | ICD-10-CM | POA: Diagnosis not present

## 2019-04-09 DIAGNOSIS — I429 Cardiomyopathy, unspecified: Secondary | ICD-10-CM | POA: Diagnosis not present

## 2019-04-09 MED ORDER — METOPROLOL TARTRATE 5 MG/5ML IV SOLN: 5.0000 mg | INTRAVENOUS | Status: DC | PRN

## 2019-04-09 MED ORDER — METOPROLOL TARTRATE 5 MG/5ML IV SOLN
INTRAVENOUS | Status: AC
  Filled 2019-04-09: qty 5

## 2019-04-09 MED ORDER — IOHEXOL 350 MG/ML SOLN
80.0000 mL | Freq: Once | INTRAVENOUS | Status: AC | PRN
  Administered 2019-04-09: 80 mL via INTRAVENOUS

## 2019-04-09 MED ORDER — NITROGLYCERIN 0.4 MG SL SUBL
SUBLINGUAL_TABLET | SUBLINGUAL | Status: AC
  Filled 2019-04-09: qty 2

## 2019-04-09 MED ORDER — NITROGLYCERIN 0.4 MG SL SUBL
0.8000 mg | SUBLINGUAL_TABLET | Freq: Once | SUBLINGUAL | Status: AC
  Administered 2019-04-09: 14:00:00 0.8 mg via SUBLINGUAL
  Filled 2019-04-09: qty 25

## 2019-04-09 NOTE — Progress Notes (Signed)
CT scan completed. Tolerated well. D/C home walking. Awake and alert. In no distress. 

## 2019-04-10 ENCOUNTER — Telehealth (HOSPITAL_COMMUNITY): Payer: Self-pay

## 2019-04-10 NOTE — Telephone Encounter (Signed)
-----   Message from Larey Dresser, MD sent at 04/10/2019 10:57 AM EDT ----- Please let her know that there is no significant coronary disease.

## 2019-04-10 NOTE — Telephone Encounter (Signed)
Pt appreciative and verbalized understanding.

## 2019-04-20 DIAGNOSIS — Z6841 Body Mass Index (BMI) 40.0 and over, adult: Secondary | ICD-10-CM | POA: Diagnosis not present

## 2019-04-20 DIAGNOSIS — L237 Allergic contact dermatitis due to plants, except food: Secondary | ICD-10-CM | POA: Diagnosis not present

## 2019-04-20 DIAGNOSIS — G4700 Insomnia, unspecified: Secondary | ICD-10-CM | POA: Diagnosis not present

## 2019-04-20 DIAGNOSIS — M170 Bilateral primary osteoarthritis of knee: Secondary | ICD-10-CM | POA: Diagnosis not present

## 2019-04-20 DIAGNOSIS — I10 Essential (primary) hypertension: Secondary | ICD-10-CM | POA: Diagnosis not present

## 2019-04-22 NOTE — Progress Notes (Signed)
[place]  [phone] [phone]    ID: [name]   DOB: 03-14-1951  MR#: [id number]  GYI#:[id number]   Patient Care Team: Yolanda Kiel, MD as PCP - General (Internal Medicine) [name]., MD as Consulting Physician (Pain Medicine) Yolanda Salm, MD as Referring Physician (Orthopedic Surgery) Yolanda Graff, DO as Consulting Physician (Orthopedic Surgery) Yolanda Dresser, MD as Consulting Physician (Cardiology) Bensimhon, Shaune Pascal, MD as Consulting Physician (Cardiology) Yolanda Gibbs, MD (Radiology) [name], Yolanda Islam, MD as Consulting Physician (Infectious Diseases) Yolanda Pray, MD as Consulting Physician (Radiation Oncology) [name], Yolanda Dad, MD as Consulting Physician (Oncology)   CHIEF COMPLAINT: Metastatic HER-2 positive, estrogen receptor negative breast cancer (s/p bilateral mastectomies)  CURRENT TREATMENT: Trastuzumab every 28 days; lifelong warfarin   INTERVAL HISTORY: [name] returns today for follow-up and treatment of her estrogen receptor negative, but HER2 amplified breast cancer.  She continues on trastuzumab. She tolerates this well and without any noticeable side effects.   She also continues on warfarin. She tolerates this well and without any noticeable side effects. Her PT:INR is generally in range: Results for [name], [name] (MRN [id number]) as of 04/23/2019 08:15 (MRN [id number]) as of 04/23/2019 08:15  Ref. Range 11/05/2018 08:33 01/01/2019 10:20 01/29/2019 08:18 02/26/2019 08:19 03/26/2019 08:39  INR Latest Ref Range: 0.8 - 1.2  1.9 (H) 1.7 (H) 1.9 (H) 2.9 (H) 2.4 (H)   Since her last visit, she underwent repeat echocardiogram on 03/02/2019. This showed an ejection fraction of 60-65%, which is stable from 09/2018.  She also underwent cardiac CTA on 04/09/2019, which showed a coronary calcium score of 0 units, suggesting low risk for future cardiac events, and no significant coronary disease.    REVIEW OF SYSTEMS: [name] is being  very careful regarding the coronavirus and her children to help with shopping although she does some shopping herself.  She also does the mowing and other physical activity since her husband is now an amputee.  1 of her daughters works for the state as a Presenter, broadcasting does live with them but she is very careful also and takes a shower whenever she gets home.  [name] has aches and pains here and there and occasional muscle spasms but these are not different from baseline and she attributes them I think correctly to arthritis.  Detailed review of systems today was otherwise stable  BREAST CANCER HISTORY:   From the earlier summary:  [name] is 68 years old [name] female.  This woman has been in good health all of her life.  She noted a swelling and discomfort in her right breast in June 2004.  She was seen in the Emergency Room in [place] and was treated for mastitis.  She was treated for a number of months with mastitis and the swelling did not get better. She was given hydrocodone and Cipro.  Finally, the swelling did get better and ultimately the nipple became retracted and she noticed some dimpling in her skin. She had a mammogram in July of 2004 in [place] with subsequent mammogram on May 27, 2003, by Dr. Isaiah Davis.  Mammogram done on September 30 showed marked increased density in the left breast.  Biopsy was performed the same day.  It was noted at the 12 o'clock position, deep in the breast was a focal hypoechoic mass, at least 3.5 cm in diameter.  Biopsy did in fact show invasive in situ mammary carcinoma. This was felt to be both at least intermediate, high grade.  No definite  lymphovascular invasion was identified. ER and PR negative, Her2 testing positive. [name] continues to have pain in her breast.  She continues to take hydrocodone a number of times a day.  She has been seen by [name], who felt that neoadjuvant chemotherapy would be required.    Initial staging studies  showed evidence of liver and lung mets.   Patient also has evidence of bone lesions. Patient started neoadjuvant chemotherapy, Taxotere/Carbo/Herceptin in October 2004.   Patient had a CT scan in December 2004 which demonstrated extensive clot in the SVC innominate vein, bilateral jugular vein and  She was started on anticoagulation therapy. She received a total of 6 cycles of Taxotere/Carbo/Herceptin, completed in April 2005."  Patient has been on  trastuzumab continued indefinitely; has also received lapatinib and capecitabine for variable intervals in 2007-2008. Most recent echo 12/01/2013 showed an ejection fraction of 55%. She is status post bilateral mastectomies with bilateral axillary lymph node dissection 12/07/2004, showing (a) on the right, a mypT1c ypN1 invasive ductal carcinoma, grade 3, estrogen and progesterone receptor negative, HER-2 positive, with an MIB-1 of 31% (b) on the left, ypT2 ypN1 invasive ductal carcinoma, grade 2, estrogen and progesterone receptor negative, HER-2 positive, with an MIB-1 of 35%. She is Status post radiation June through July of 2006, to the right chest wall, left chest wall, bilateral supraclavicular fossae, and bilateral axillary boosts; with additional radiation to the right and left chest walls and the central chest wall completed November of 2007. She is status post ixempra x9 completed August of 2009. 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 Patient has chronic pain, with negative PET scan 08/24/2013 (no evidence of active cancer). On Neurontin and Tramadol therapy.  Her subsequent history is as detailed below   PAST MEDICAL HISTORY: Past Medical History:  Diagnosis Date  . Breast cancer ([place])    mets to liver and lung  . Breast cancer metastasized to multiple sites ([place]) 02/26/2013  . History of chemotherapy Feb. 2006   taxotere/herceptin/carboplatin  . Hypertension    . Neuropathy   . Radiation 07/31/2006   left upper chest  . Radiation 06/17/2006-06/27/2006   6480 cGy bilat. chest wall  . SVC syndrome   . Thrombosis     PAST SURGICAL HISTORY: Past Surgical History:  Procedure Laterality Date  . ANKLE SURGERY    . BACK SURGERY    . CHOLECYSTECTOMY  1989  . MASTECTOMY Bilateral   . PERIPHERALLY INSERTED CENTRAL CATHETER INSERTION    . TUBAL LIGATION  1986    FAMILY HISTORY Family History  Problem Relation Age of Onset  . Heart failure Father   . Cancer Father        Prostate cancer  . Heart failure Brother   . Cancer Brother        Prostate cancer  . Diabetes Maternal Aunt   She had three brothers, one died of gunshot wound, one of complications of diabetes mellitus and one of myocardial infarction.  She has no sisters.  Mother died of complications of brain metastasis in 12.  Father has had a myocardial infarction in 1999.  No history of breast or ovarian cancer in the family.     GYNECOLOGIC HISTORY:    No LMP recorded. Patient is postmenopausal. Menarche at age 57.   Gravida 3, para 3.   First live birth at age 27.   No history of breast feeding. No history of hormonal  replacement therapy.    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 He used to work as a Art gallery manager, but is now retired and disabled. The patient has three children, Monette who lives in [place] and works as a Hydrographic surveyor, Financial risk analyst who lives in [place] and works as a Administrator, and [name] who lives in [place] and also works as a Hydrographic surveyor. The patient has 14 grandchildren and 4 great-grandchildren. She attends a [employer]. Very involved with school kids.  ADVANCED DIRECTIVES:  Not in place   HEALTH MAINTENANCE: (Updated 06/12/2013) Social History   Tobacco Use  . Smoking status: Never Smoker   . Smokeless tobacco: Never Used  Substance Use Topics  . Alcohol use: Yes    Comment: occasional  . Drug use: No     Colonoscopy: Never and "I don't want one"  PAP:  1987  Bone density:  Never   Lipid panel:  Not on file   Allergies  Allergen Reactions  . Penicillins Hives    PATIENT HAS TOLERATED CEPHALOSPORINGS  . Adhesive [Tape] Other (See Comments)    Tears skin     Current Outpatient Medications  Medication Sig Dispense Refill  . acetaminophen (TYLENOL) 500 MG tablet Take 1,000 mg by mouth every 6 (six) hours as needed for mild pain or fever.     Marland Kitchen albuterol (PROVENTIL HFA;VENTOLIN HFA) 108 (90 BASE) MCG/ACT inhaler Inhale 2 puffs into the lungs every 6 (six) hours as needed for wheezing. 1 Inhaler 5  . ALPRAZolam (XANAX) 1 MG tablet Take 1 tablet (1 mg total) by mouth 3 (three) times daily as needed. for anxiety 60 tablet 2  . amLODipine (NORVASC) 10 MG tablet Take 10 mg by mouth every morning.    . baclofen (LIORESAL) 10 MG tablet TAKE 1 TABLET BY MOUTH THREE TIMES DAILY AS NEEDED FOR MUSCLE SPASMS 270 tablet 0  . carvedilol (COREG) 12.5 MG tablet Take 1 tablet (12.5 mg total) by mouth 2 (two) times daily. 60 tablet 6  . diclofenac sodium (VOLTAREN) 1 % GEL Apply 2 g topically daily as needed (for pain). Apply to knees and shoulders 100 g 6  . furosemide (LASIX) 40 MG tablet TAKE 1 TABLET(40 MG) BY MOUTH DAILY 30 tablet 3  . gabapentin (NEURONTIN) 300 MG capsule TAKE 2 CAPSULES(600 MG) BY MOUTH THREE TIMES DAILY 540 capsule 0  . losartan (COZAAR) 100 MG tablet Take 100 mg by mouth every morning.    . potassium chloride (K-DUR,KLOR-CON) 20 MEQ tablet Take 1 tablet (20 mEq total) by mouth daily. 30 tablet 3  . predniSONE (DELTASONE) 5 MG tablet     . spironolactone (ALDACTONE) 25 MG tablet TAKE 1 TABLET(25 MG) BY MOUTH DAILY 30 tablet 5  . temazepam (RESTORIL) 30 MG capsule TAKE ONE CAPSULE BY MOUTH EVERY NIGHT AT BEDTIME AS NEEDED FOR SLEEP 30 capsule 2  . warfarin  (COUMADIN) 5 MG tablet Take one tablet on odd numbered days and one and a half tablets on even numbered days 90 tablet 4   No current facility-administered medications for this visit.    Facility-Administered Medications Ordered in Other Visits  Medication Dose Route Frequency Provider Last Rate Last Dose  . sodium chloride flush (NS) 0.9 % injection 10 mL  10 mL Intravenous PRN Trevyon Swor, Yolanda Dad, MD   10 mL at 12/15/15 1200  .  sodium chloride flush (NS) 0.9 % injection 10 mL  10 mL Intracatheter PRN Crisanto Nied, Yolanda Dad, MD   10 mL at 08/14/18 1116  . sodium chloride flush (NS) 0.9 % injection 10 mL  10 mL Intracatheter PRN Kyrollos Cordell, Yolanda Dad, MD        OBJECTIVE: Morbidly obese African-American woman in no acute distress   Vitals:   04/23/19 0854  BP: (!) 144/92  Pulse: 63  Resp: 17  Temp: 98.2 F (36.8 C)  SpO2: 99%     Body mass index is 47.63 kg/m.    ECOG FS: 1 Filed Weights   04/23/19 0854  Weight: 268 lb 14.4 oz (122 kg)    Sclerae unicteric, EOMs intact Wearing a mask No cervical or supraclavicular adenopathy Lungs no rales or rhonchi Heart regular rate and rhythm Abd soft, nontender, positive bowel sounds MSK no focal spinal tenderness, grade 1 left upper extremity lymphedema Neuro: nonfocal, well oriented, appropriate affect Breasts: Status post bilateral mastectomies.  No evidence of chest wall recurrence.  Both axillae are benign.     LAB RESULTS: Lab Results  Component Value Date   WBC 10.1 04/23/2019   NEUTROABS 8.3 (H) 04/23/2019   HGB 11.5 (L) 04/23/2019   HCT 33.8 (L) 04/23/2019   MCV 84.5 04/23/2019   PLT 243 04/23/2019      Chemistry      Component Value Date/Time   NA 141 04/23/2019 0809   NA 143 08/14/2017 0811   K 4.2 04/23/2019 0809   K 3.8 08/14/2017 0811   CL 105 04/23/2019 0809   CL 104 01/30/2013 0850   CO2 27 04/23/2019 0809   CO2 24 08/14/2017 0811   BUN 27 (H) 04/23/2019 0809   BUN 14.9 08/14/2017 0811   CREATININE 0.98  04/23/2019 0809   CREATININE 1.18 (H) 02/26/2019 0819   CREATININE 0.9 08/14/2017 0811      Component Value Date/Time   CALCIUM 9.3 04/23/2019 0809   CALCIUM 9.4 08/14/2017 0811   ALKPHOS 70 04/23/2019 0809   ALKPHOS 73 08/14/2017 0811   AST 10 (L) 04/23/2019 0809   AST 11 (L) 02/26/2019 0819   AST 10 08/14/2017 0811   ALT 9 04/23/2019 0809   ALT 11 02/26/2019 0819   ALT 10 08/14/2017 0811   BILITOT 0.7 04/23/2019 0809   BILITOT 0.4 02/26/2019 0819   BILITOT 0.62 08/14/2017 0811      STUDIES: Ct Coronary Morph W/cta Cor W/score W/ca W/cm &/or Wo/cm  Addendum Date: 04/10/2019   ADDENDUM REPORT: 04/10/2019 07:20 CLINICAL DATA:  Chest pain EXAM: Cardiac CTA MEDICATIONS: Sub lingual nitro. 11m x 2 TECHNIQUE: The patient was scanned on a Siemens 1201slice scanner. Gantry rotation speed was 250 msecs. Collimation was 0.6 mm. A 100 kV prospective scan was triggered in the ascending thoracic aorta at 35-75% of the R-R interval. Average HR during the scan was 60 bpm. The 3D data set was interpreted on a dedicated work station using MPR, MIP and VRT modes. A total of 80cc of contrast was used. FINDINGS: [facility]cardiac: [facility] Catheter enters right atrium. Pulmonary veins drain normally to left atrium. Calcium Score: 0 Agatston units. Coronary Arteries: Codominant with no anomalies LM: No plaque or stenosis. LAD system:  No plaque or stenosis. Circumflex system: Large, codominant vessel.  No plaque or stenosis. RCA system: Relatively small RCA, codominant with LCx. No plaque or stenosis. IMPRESSION: 1. Coronary calcium score 0 Agatston units, suggesting low risk for future  cardiac events. 2.  No significant coronary disease noted. Dalton Mclean Electronically Signed   By: Loralie Champagne M.D.   On: 04/10/2019 07:20   Result Date: 04/10/2019 EXAM: OVER-READ INTERPRETATION  CT CHEST The following report is an over-read performed by radiologist Dr. Vinnie Langton of  [facility], [place] on 04/09/2019. This over-read does not include interpretation of cardiac or coronary anatomy or pathology. The coronary calcium score/coronary CTA interpretation by the cardiologist is attached. COMPARISON:  None. FINDINGS: Central venous catheter extending into the right atrium. Within the visualized portions of the thorax there are no suspicious appearing pulmonary nodules or masses, there is no acute consolidative airspace disease, no pleural effusions, no pneumothorax and no lymphadenopathy. Visualized portions of the upper abdomen are unremarkable. There are no aggressive appearing lytic or blastic lesions noted in the visualized portions of the skeleton. Postoperative changes of bilateral modified radical mastectomy. IMPRESSION: 1. No significant incidental noncardiac findings are noted. Electronically Signed: By: Vinnie Langton M.D. On: 04/09/2019 14:21     ASSESSMENT: 68 y.o.  [place], [place], woman  (1)  with a history of inflammatory right breast cancer metastatic at presentation September 2004 with involvement of liver and bone, HER-2 positive, estrogen and progesterone receptor negative  (2) treated with carboplatin, docetaxel and Herceptin x 6 completed April 2005  (3) trastuzumab continued indefinitely;   (a) has also received lapatinib and capecitabine for variable intervals in 2007-2008.  (b) Every 6 month echo: 09/24/2017 showed an ejection fraction in the 55-60%  (c) echocardiogram 03/07/2018 showed an ejection fraction in the 55-60%  (d) cardiogram 09/30/2018 showed ejection fraction in the 60-65% range.  (4) status post bilateral mastectomies with bilateral axillary lymph node dissection 12/07/2004, showing  (a) on the right, a mypT1c ypN1 invasive ductal carcinoma, grade 3, estrogen and progesterone receptor negative, HER-2 positive, with an MIB-1 of 31%  (b) on the left, ypT2 ypN1 invasive ductal carcinoma, grade 2, estrogen and progesterone  receptor negative, HER-2 positive, with an MIB-1 of 35%.  (5)  Status post radiation June through July of 2006, to the right chest wall, left chest wall, bilateral supraclavicular fossae, and bilateral axillary boosts; with additional radiation to the right and left chest walls and the central chest wall completed November of 2007  (6) status post Ixempra x9 completed August of 2009.  (7) history of superior vena caval syndrome, on life long anticoagulation   (8)  History of chemotherapy-induced neuropathy.   (9)  chronic pain, with negative PET scan 08/24/2013 (no evidence of active cancer). On Neurontin and Tramadol  (a) repeat PET scan December 2016 again negative.  (b) Repeat PET scan 04/23/2017 shows no active malignancy  (c) PET scan 07/09/2018 shows no evidence of active malignancy.  (10) right upper extremity cellulitis, no bacteremia; treated with cephalexin /doxycycline for 2 weeks, with resolution  (11) subacute fracture right humerus, nondisplaced, status post fall 03/27/2017   PLAN: Suzzanne is now a little over 14 years out from definitive surgery for her breast cancer with no evidence of disease recurrence.  This is very favorable.  Her INR is not where it needs to be.  She tells me she is eating a lot of greens.  We are going to go up on the dose so on even numbered days she will take 5 mg in an odd-numbered days she will take 7-1/34m.  She will have this rechecked at the next visit here.  We are going to do a repeat PET scan in  December and I will see her shortly after that.  From that point we will start doing PET scans on a once a year basis  She knows to call for any other issue that may develop before the next visit.  Arsenio Schnorr, Yolanda Dad, MD  04/23/19 9:18 AM Medical Oncology and Hematology [facility] [address] [place], [place] [address] Tel. [phone]    Fax. [phone]   I, Wilburn Mylar, am acting as scribe for Dr. Virgie Davis.  Alaric Gladwin.  I, Lurline Del MD, have reviewed the above documentation for accuracy and completeness, and I agree with the above.

## 2019-04-23 ENCOUNTER — Other Ambulatory Visit: Payer: Medicare Other

## 2019-04-23 ENCOUNTER — Inpatient Hospital Stay: Payer: Medicare Other | Attending: Oncology

## 2019-04-23 ENCOUNTER — Other Ambulatory Visit: Payer: Self-pay

## 2019-04-23 ENCOUNTER — Ambulatory Visit: Payer: Medicare Other | Admitting: Oncology

## 2019-04-23 ENCOUNTER — Ambulatory Visit: Payer: Medicare Other

## 2019-04-23 ENCOUNTER — Inpatient Hospital Stay: Payer: Medicare Other

## 2019-04-23 ENCOUNTER — Inpatient Hospital Stay (HOSPITAL_BASED_OUTPATIENT_CLINIC_OR_DEPARTMENT_OTHER): Payer: Medicare Other | Admitting: Oncology

## 2019-04-23 VITALS — BP 144/92 | HR 63 | Temp 98.2°F | Resp 17 | Ht 63.0 in | Wt 268.9 lb

## 2019-04-23 DIAGNOSIS — D689 Coagulation defect, unspecified: Secondary | ICD-10-CM | POA: Diagnosis not present

## 2019-04-23 DIAGNOSIS — C50012 Malignant neoplasm of nipple and areola, left female breast: Secondary | ICD-10-CM | POA: Diagnosis not present

## 2019-04-23 DIAGNOSIS — C50911 Malignant neoplasm of unspecified site of right female breast: Secondary | ICD-10-CM | POA: Insufficient documentation

## 2019-04-23 DIAGNOSIS — C50919 Malignant neoplasm of unspecified site of unspecified female breast: Secondary | ICD-10-CM

## 2019-04-23 DIAGNOSIS — Z171 Estrogen receptor negative status [ER-]: Secondary | ICD-10-CM | POA: Insufficient documentation

## 2019-04-23 DIAGNOSIS — Z7901 Long term (current) use of anticoagulants: Secondary | ICD-10-CM | POA: Insufficient documentation

## 2019-04-23 DIAGNOSIS — C50812 Malignant neoplasm of overlapping sites of left female breast: Secondary | ICD-10-CM | POA: Diagnosis not present

## 2019-04-23 DIAGNOSIS — Z5112 Encounter for antineoplastic immunotherapy: Secondary | ICD-10-CM | POA: Insufficient documentation

## 2019-04-23 DIAGNOSIS — C50011 Malignant neoplasm of nipple and areola, right female breast: Secondary | ICD-10-CM

## 2019-04-23 DIAGNOSIS — I871 Compression of vein: Secondary | ICD-10-CM | POA: Diagnosis not present

## 2019-04-23 LAB — CBC WITH DIFFERENTIAL/PLATELET
Abs Immature Granulocytes: 0.04 10*3/uL (ref 0.00–0.07)
Basophils Absolute: 0 10*3/uL (ref 0.0–0.1)
Basophils Relative: 0 %
Eosinophils Absolute: 0.1 10*3/uL (ref 0.0–0.5)
Eosinophils Relative: 1 %
HCT: 33.8 % — ABNORMAL LOW (ref 36.0–46.0)
Hemoglobin: 11.5 g/dL — ABNORMAL LOW (ref 12.0–15.0)
Immature Granulocytes: 0 %
Lymphocytes Relative: 12 %
Lymphs Abs: 1.2 10*3/uL (ref 0.7–4.0)
MCH: 28.8 pg (ref 26.0–34.0)
MCHC: 34 g/dL (ref 30.0–36.0)
MCV: 84.5 fL (ref 80.0–100.0)
Monocytes Absolute: 0.5 10*3/uL (ref 0.1–1.0)
Monocytes Relative: 5 %
Neutro Abs: 8.3 10*3/uL — ABNORMAL HIGH (ref 1.7–7.7)
Neutrophils Relative %: 82 %
Platelets: 243 10*3/uL (ref 150–400)
RBC: 4 MIL/uL (ref 3.87–5.11)
RDW: 14.7 % (ref 11.5–15.5)
WBC: 10.1 10*3/uL (ref 4.0–10.5)
nRBC: 0 % (ref 0.0–0.2)

## 2019-04-23 LAB — COMPREHENSIVE METABOLIC PANEL
ALT: 9 U/L (ref 0–44)
AST: 10 U/L — ABNORMAL LOW (ref 15–41)
Albumin: 3.6 g/dL (ref 3.5–5.0)
Alkaline Phosphatase: 70 U/L (ref 38–126)
Anion gap: 9 (ref 5–15)
BUN: 27 mg/dL — ABNORMAL HIGH (ref 8–23)
CO2: 27 mmol/L (ref 22–32)
Calcium: 9.3 mg/dL (ref 8.9–10.3)
Chloride: 105 mmol/L (ref 98–111)
Creatinine, Ser: 0.98 mg/dL (ref 0.44–1.00)
GFR calc Af Amer: >60 mL/min (ref >60)
GFR calc non Af Amer: 60 mL/min — ABNORMAL LOW (ref >60)
Glucose, Bld: 91 mg/dL (ref 70–99)
Potassium: 4.2 mmol/L (ref 3.5–5.1)
Sodium: 141 mmol/L (ref 135–145)
Total Bilirubin: 0.7 mg/dL (ref 0.3–1.2)
Total Protein: 7.4 g/dL (ref 6.5–8.1)

## 2019-04-23 LAB — PROTIME-INR
INR: 1.6 — ABNORMAL HIGH (ref 0.8–1.2)
Prothrombin Time: 18.7 seconds — ABNORMAL HIGH (ref 11.4–15.2)

## 2019-04-23 MED ORDER — WARFARIN SODIUM 5 MG PO TABS: ORAL_TABLET | ORAL | 4 refills | Status: DC

## 2019-04-23 MED ORDER — DIPHENHYDRAMINE HCL 25 MG PO CAPS
25.0000 mg | ORAL_CAPSULE | Freq: Once | ORAL | Status: AC
  Administered 2019-04-23: 25 mg via ORAL

## 2019-04-23 MED ORDER — HEPARIN SOD (PORK) LOCK FLUSH 100 UNIT/ML IV SOLN
500.0000 [IU] | Freq: Once | INTRAVENOUS | Status: AC | PRN
  Administered 2019-04-23: 500 [IU]
  Filled 2019-04-23: qty 5

## 2019-04-23 MED ORDER — TRASTUZUMAB-DKST CHEMO 150 MG IV SOLR
750.0000 mg | Freq: Once | INTRAVENOUS | Status: AC
  Administered 2019-04-23: 750 mg via INTRAVENOUS
  Filled 2019-04-23: qty 35.72

## 2019-04-23 MED ORDER — LORAZEPAM 2 MG/ML IJ SOLN
INTRAMUSCULAR | Status: AC
  Filled 2019-04-23: qty 1

## 2019-04-23 MED ORDER — LORAZEPAM 2 MG/ML IJ SOLN
1.0000 mg | Freq: Once | INTRAMUSCULAR | Status: AC | PRN
  Administered 2019-04-23: 1 mg via INTRAVENOUS

## 2019-04-23 MED ORDER — ACETAMINOPHEN 325 MG PO TABS
650.0000 mg | ORAL_TABLET | Freq: Once | ORAL | Status: AC
  Administered 2019-04-23: 650 mg via ORAL

## 2019-04-23 MED ORDER — SODIUM CHLORIDE 0.9% FLUSH
10.0000 mL | INTRAVENOUS | Status: DC | PRN
  Administered 2019-04-23: 10 mL
  Filled 2019-04-23: qty 10

## 2019-04-23 MED ORDER — SODIUM CHLORIDE 0.9 % IV SOLN
Freq: Once | INTRAVENOUS | Status: AC
  Administered 2019-04-23: 10:00:00 via INTRAVENOUS
  Filled 2019-04-23: qty 250

## 2019-04-23 MED ORDER — ACETAMINOPHEN 325 MG PO TABS
ORAL_TABLET | ORAL | Status: AC
  Filled 2019-04-23: qty 2

## 2019-04-23 MED ORDER — DIPHENHYDRAMINE HCL 25 MG PO CAPS
ORAL_CAPSULE | ORAL | Status: AC
  Filled 2019-04-23: qty 1

## 2019-04-23 NOTE — Patient Instructions (Signed)
Trastuzumab injection for infusion What is this medicine? TRASTUZUMAB (tras TOO zoo mab) is a monoclonal antibody. It is used to treat breast cancer and stomach cancer. This medicine may be used for other purposes; ask your health care provider or pharmacist if you have questions. COMMON BRAND NAME(S): Herceptin, Herzuma, KANJINTI, Ogivri, Ontruzant, Trazimera What should I tell my health care provider before I take this medicine? They need to know if you have any of these conditions:  heart disease  heart failure  lung or breathing disease, like asthma  an unusual or allergic reaction to trastuzumab, benzyl alcohol, or other medications, foods, dyes, or preservatives  pregnant or trying to get pregnant  breast-feeding How should I use this medicine? This drug is given as an infusion into a vein. It is administered in a hospital or clinic by a specially trained health care professional. Talk to your pediatrician regarding the use of this medicine in children. This medicine is not approved for use in children. Overdosage: If you think you have taken too much of this medicine contact a poison control center or emergency room at once. NOTE: This medicine is only for you. Do not share this medicine with others. What if I miss a dose? It is important not to miss a dose. Call your doctor or health care professional if you are unable to keep an appointment. What may interact with this medicine? This medicine may interact with the following medications:  certain types of chemotherapy, such as daunorubicin, doxorubicin, epirubicin, and idarubicin This list may not describe all possible interactions. Give your health care provider a list of all the medicines, herbs, non-prescription drugs, or dietary supplements you use. Also tell them if you smoke, drink alcohol, or use illegal drugs. Some items may interact with your medicine. What should I watch for while using this medicine? Visit your  doctor for checks on your progress. Report any side effects. Continue your course of treatment even though you feel ill unless your doctor tells you to stop. Call your doctor or health care professional for advice if you get a fever, chills or sore throat, or other symptoms of a cold or flu. Do not treat yourself. Try to avoid being around people who are sick. You may experience fever, chills and shaking during your first infusion. These effects are usually mild and can be treated with other medicines. Report any side effects during the infusion to your health care professional. Fever and chills usually do not happen with later infusions. Do not become pregnant while taking this medicine or for 7 months after stopping it. Women should inform their doctor if they wish to become pregnant or think they might be pregnant. Women of child-bearing potential will need to have a negative pregnancy test before starting this medicine. There is a potential for serious side effects to an unborn child. Talk to your health care professional or pharmacist for more information. Do not breast-feed an infant while taking this medicine or for 7 months after stopping it. Women must use effective birth control with this medicine. What side effects may I notice from receiving this medicine? Side effects that you should report to your doctor or health care professional as soon as possible:  allergic reactions like skin rash, itching or hives, swelling of the face, lips, or tongue  chest pain or palpitations  cough  dizziness  feeling faint or lightheaded, falls  fever  general ill feeling or flu-like symptoms  signs of worsening heart failure like   breathing problems; swelling in your legs and feet  unusually weak or tired Side effects that usually do not require medical attention (report to your doctor or health care professional if they continue or are bothersome):  bone pain  changes in  taste  diarrhea  joint pain  nausea/vomiting  weight loss This list may not describe all possible side effects. Call your doctor for medical advice about side effects. You may report side effects to FDA at 1-800-FDA-1088. Where should I keep my medicine? This drug is given in a hospital or clinic and will not be stored at home. NOTE: This sheet is a summary. It may not cover all possible information. If you have questions about this medicine, talk to your doctor, pharmacist, or health care provider.  2020 Elsevier/Gold Standard (2016-08-07 14:37:52)  Coronavirus (COVID-19) Are you at risk?  Are you at risk for the Coronavirus (COVID-19)?  To be considered HIGH RISK for Coronavirus (COVID-19), you have to meet the following criteria:  . Traveled to [place], [place], [place], [place] or [place]; or in the [place] to [place], [place], [place], or [place]; and have fever, cough, and shortness of breath within the last 2 weeks of travel OR . Been in close contact with a person diagnosed with COVID-19 within the last 2 weeks and have fever, cough, and shortness of breath . IF YOU DO NOT MEET THESE CRITERIA, YOU ARE CONSIDERED LOW RISK FOR COVID-19.  What to do if you are HIGH RISK for COVID-19?  . If you are having a medical emergency, call 911. . Seek medical care right away. Before you go to a doctor's office, urgent care or emergency department, call ahead and tell them about your recent travel, contact with someone diagnosed with COVID-19, and your symptoms. You should receive instructions from your physician's office regarding next steps of care.  . When you arrive at healthcare provider, tell the healthcare staff immediately you have returned from visiting [place], [place], [place], [place] or [place]; or traveled in the [place] to [place], [place], [place], or [place]; in the last two weeks or you have been in close contact with a person diagnosed with COVID-19  in the last 2 weeks.   . Tell the health care staff about your symptoms: fever, cough and shortness of breath. . After you have been seen by a medical provider, you will be either: o Tested for (COVID-19) and discharged home on quarantine except to seek medical care if symptoms worsen, and asked to  - Stay home and avoid contact with others until you get your results (4-5 days)  - Avoid travel on public transportation if possible (such as bus, train, or airplane) or o Sent to the Emergency Department by EMS for evaluation, COVID-19 testing, and possible admission depending on your condition and test results.  What to do if you are LOW RISK for COVID-19?  Reduce your risk of any infection by using the same precautions used for avoiding the common cold or flu:  . Wash your hands often with soap and warm water for at least 20 seconds.  If soap and water are not readily available, use an alcohol-based hand sanitizer with at least 60% alcohol.  . If coughing or sneezing, cover your mouth and nose by coughing or sneezing into the elbow areas of your shirt or coat, into a tissue or into your sleeve (not your hands). . Avoid shaking hands with others and consider head nods   or verbal greetings only. . Avoid touching your eyes, nose, or mouth with unwashed hands.  . Avoid close contact with people who are sick. . Avoid places or events with large numbers of people in one location, like concerts or sporting events. . Carefully consider travel plans you have or are making. . If you are planning any travel outside or inside the [place], visit the CDC's Travelers' Health webpage for the latest health notices. . If you have some symptoms but not all symptoms, continue to monitor at home and seek medical attention if your symptoms worsen. . If you are having a medical emergency, call 911.   ADDITIONAL HEALTHCARE OPTIONS FOR PATIENTS  [facility] / e-Visit:  [email or web address]         [facility]: [phone]  [facility]: [phone]                   [facility]: [phone]   

## 2019-04-24 ENCOUNTER — Telehealth: Payer: Self-pay | Admitting: Oncology

## 2019-04-24 NOTE — Telephone Encounter (Signed)
I talk with patient and she express that she has transportation issues so she needed mornings.

## 2019-05-20 ENCOUNTER — Other Ambulatory Visit: Payer: Self-pay | Admitting: *Deleted

## 2019-05-20 DIAGNOSIS — C50911 Malignant neoplasm of unspecified site of right female breast: Secondary | ICD-10-CM

## 2019-05-21 ENCOUNTER — Other Ambulatory Visit: Payer: Medicare Other

## 2019-05-21 ENCOUNTER — Inpatient Hospital Stay: Payer: Medicare Other

## 2019-05-21 ENCOUNTER — Other Ambulatory Visit: Payer: Self-pay

## 2019-05-21 ENCOUNTER — Ambulatory Visit: Payer: Medicare Other

## 2019-05-21 ENCOUNTER — Inpatient Hospital Stay: Payer: Medicare Other | Attending: Oncology

## 2019-05-21 VITALS — BP 135/74 | HR 64 | Temp 98.0°F | Resp 18 | Wt 275.0 lb

## 2019-05-21 DIAGNOSIS — Z23 Encounter for immunization: Secondary | ICD-10-CM | POA: Diagnosis not present

## 2019-05-21 DIAGNOSIS — Z5112 Encounter for antineoplastic immunotherapy: Secondary | ICD-10-CM | POA: Diagnosis not present

## 2019-05-21 DIAGNOSIS — C50911 Malignant neoplasm of unspecified site of right female breast: Secondary | ICD-10-CM | POA: Diagnosis not present

## 2019-05-21 DIAGNOSIS — C50812 Malignant neoplasm of overlapping sites of left female breast: Secondary | ICD-10-CM | POA: Diagnosis not present

## 2019-05-21 DIAGNOSIS — D689 Coagulation defect, unspecified: Secondary | ICD-10-CM | POA: Diagnosis not present

## 2019-05-21 DIAGNOSIS — C50919 Malignant neoplasm of unspecified site of unspecified female breast: Secondary | ICD-10-CM

## 2019-05-21 DIAGNOSIS — C50912 Malignant neoplasm of unspecified site of left female breast: Secondary | ICD-10-CM

## 2019-05-21 LAB — CBC WITH DIFFERENTIAL (CANCER CENTER ONLY)
Abs Immature Granulocytes: 0.03 10*3/uL (ref 0.00–0.07)
Basophils Absolute: 0 10*3/uL (ref 0.0–0.1)
Basophils Relative: 0 %
Eosinophils Absolute: 0.1 10*3/uL (ref 0.0–0.5)
Eosinophils Relative: 1 %
HCT: 33.1 % — ABNORMAL LOW (ref 36.0–46.0)
Hemoglobin: 11.3 g/dL — ABNORMAL LOW (ref 12.0–15.0)
Immature Granulocytes: 0 %
Lymphocytes Relative: 19 %
Lymphs Abs: 1.8 10*3/uL (ref 0.7–4.0)
MCH: 29 pg (ref 26.0–34.0)
MCHC: 34.1 g/dL (ref 30.0–36.0)
MCV: 85.1 fL (ref 80.0–100.0)
Monocytes Absolute: 0.7 10*3/uL (ref 0.1–1.0)
Monocytes Relative: 7 %
Neutro Abs: 6.9 10*3/uL (ref 1.7–7.7)
Neutrophils Relative %: 73 %
Platelet Count: 219 10*3/uL (ref 150–400)
RBC: 3.89 MIL/uL (ref 3.87–5.11)
RDW: 15.7 % — ABNORMAL HIGH (ref 11.5–15.5)
WBC Count: 9.5 10*3/uL (ref 4.0–10.5)
nRBC: 0 % (ref 0.0–0.2)

## 2019-05-21 LAB — CMP (CANCER CENTER ONLY)
ALT: 12 U/L (ref 0–44)
AST: 12 U/L — ABNORMAL LOW (ref 15–41)
Albumin: 3.7 g/dL (ref 3.5–5.0)
Alkaline Phosphatase: 77 U/L (ref 38–126)
Anion gap: 9 (ref 5–15)
BUN: 23 mg/dL (ref 8–23)
CO2: 26 mmol/L (ref 22–32)
Calcium: 9.6 mg/dL (ref 8.9–10.3)
Chloride: 108 mmol/L (ref 98–111)
Creatinine: 0.96 mg/dL (ref 0.44–1.00)
GFR, Est AFR Am: >60 mL/min (ref >60)
GFR, Estimated: >60 mL/min (ref >60)
Glucose, Bld: 105 mg/dL — ABNORMAL HIGH (ref 70–99)
Potassium: 4.3 mmol/L (ref 3.5–5.1)
Sodium: 143 mmol/L (ref 135–145)
Total Bilirubin: 0.6 mg/dL (ref 0.3–1.2)
Total Protein: 7.4 g/dL (ref 6.5–8.1)

## 2019-05-21 LAB — PROTIME-INR
INR: 2.5 — ABNORMAL HIGH (ref 0.8–1.2)
Prothrombin Time: 26.2 seconds — ABNORMAL HIGH (ref 11.4–15.2)

## 2019-05-21 MED ORDER — TRASTUZUMAB-DKST CHEMO 150 MG IV SOLR
750.0000 mg | Freq: Once | INTRAVENOUS | Status: AC
  Administered 2019-05-21: 750 mg via INTRAVENOUS
  Filled 2019-05-21: qty 35.72

## 2019-05-21 MED ORDER — HEPARIN SOD (PORK) LOCK FLUSH 100 UNIT/ML IV SOLN
500.0000 [IU] | Freq: Once | INTRAVENOUS | Status: AC | PRN
  Administered 2019-05-21: 10:00:00 500 [IU]
  Filled 2019-05-21: qty 5

## 2019-05-21 MED ORDER — DIPHENHYDRAMINE HCL 25 MG PO CAPS
ORAL_CAPSULE | ORAL | Status: AC
  Filled 2019-05-21: qty 1

## 2019-05-21 MED ORDER — ACETAMINOPHEN 325 MG PO TABS
ORAL_TABLET | ORAL | Status: AC
  Filled 2019-05-21: qty 2

## 2019-05-21 MED ORDER — SODIUM CHLORIDE 0.9 % IV SOLN
Freq: Once | INTRAVENOUS | Status: AC
  Administered 2019-05-21: 09:00:00 via INTRAVENOUS
  Filled 2019-05-21: qty 250

## 2019-05-21 MED ORDER — DIPHENHYDRAMINE HCL 25 MG PO CAPS
25.0000 mg | ORAL_CAPSULE | Freq: Once | ORAL | Status: AC
  Administered 2019-05-21: 25 mg via ORAL

## 2019-05-21 MED ORDER — LORAZEPAM 2 MG/ML IJ SOLN
1.0000 mg | Freq: Once | INTRAMUSCULAR | Status: AC | PRN
  Administered 2019-05-21: 09:00:00 1 mg via INTRAVENOUS

## 2019-05-21 MED ORDER — ACETAMINOPHEN 325 MG PO TABS
650.0000 mg | ORAL_TABLET | Freq: Once | ORAL | Status: AC
  Administered 2019-05-21: 650 mg via ORAL

## 2019-05-21 MED ORDER — INFLUENZA VAC A&B SA ADJ QUAD 0.5 ML IM PRSY
PREFILLED_SYRINGE | INTRAMUSCULAR | Status: AC
  Filled 2019-05-21: qty 0.5

## 2019-05-21 MED ORDER — SODIUM CHLORIDE 0.9% FLUSH
10.0000 mL | INTRAVENOUS | Status: DC | PRN
  Administered 2019-05-21: 10 mL
  Filled 2019-05-21: qty 10

## 2019-05-21 MED ORDER — INFLUENZA VAC A&B SA ADJ QUAD 0.5 ML IM PRSY
0.5000 mL | PREFILLED_SYRINGE | Freq: Once | INTRAMUSCULAR | Status: AC
  Administered 2019-05-21: 0.5 mL via INTRAMUSCULAR

## 2019-05-21 MED ORDER — LORAZEPAM 2 MG/ML IJ SOLN
INTRAMUSCULAR | Status: AC
  Filled 2019-05-21: qty 1

## 2019-05-21 NOTE — Patient Instructions (Signed)
[facility] Discharge Instructions for Patients Receiving Chemotherapy  Today you received the following chemotherapy agents Trastuzumab-dkst (OGIVRI).  To help prevent nausea and vomiting after your treatment, we encourage you to take your nausea medication as prescribed.   If you develop nausea and vomiting that is not controlled by your nausea medication, call the clinic.   BELOW ARE SYMPTOMS THAT SHOULD BE REPORTED IMMEDIATELY:  *FEVER GREATER THAN 100.5 F  *CHILLS WITH OR WITHOUT FEVER  NAUSEA AND VOMITING THAT IS NOT CONTROLLED WITH YOUR NAUSEA MEDICATION  *UNUSUAL SHORTNESS OF BREATH  *UNUSUAL BRUISING OR BLEEDING  TENDERNESS IN MOUTH AND THROAT WITH OR WITHOUT PRESENCE OF ULCERS  *URINARY PROBLEMS  *BOWEL PROBLEMS  UNUSUAL RASH Items with * indicate a potential emergency and should be followed up as soon as possible.  Feel free to call the clinic should you have any questions or concerns. The clinic phone number is [phone].  Please show the CHEMO ALERT CARD at check-in to the Emergency Department and triage nurse.  Coronavirus (COVID-19) Are you at risk?  Are you at risk for the Coronavirus (COVID-19)?  To be considered HIGH RISK for Coronavirus (COVID-19), you have to meet the following criteria:  . Traveled to [place], [place], [place], [place] or [place]; or in the [place] to [place], [place], [place], or [place]; and have fever, cough, and shortness of breath within the last 2 weeks of travel OR . Been in close contact with a person diagnosed with COVID-19 within the last 2 weeks and have fever, cough, and shortness of breath . IF YOU DO NOT MEET THESE CRITERIA, YOU ARE CONSIDERED LOW RISK FOR COVID-19.  What to do if you are HIGH RISK for COVID-19?  . If you are having a medical emergency, call 911. . Seek medical care right away. Before you go to a doctor's office, urgent care or emergency department, call ahead and tell  them about your recent travel, contact with someone diagnosed with COVID-19, and your symptoms. You should receive instructions from your physician's office regarding next steps of care.  . When you arrive at healthcare provider, tell the healthcare staff immediately you have returned from visiting [place], [place], [place], [place] or [place]; or traveled in the [place] to [place], [place], [place], or [place]; in the last two weeks or you have been in close contact with a person diagnosed with COVID-19 in the last 2 weeks.   . Tell the health care staff about your symptoms: fever, cough and shortness of breath. . After you have been seen by a medical provider, you will be either: o Tested for (COVID-19) and discharged home on quarantine except to seek medical care if symptoms worsen, and asked to  - Stay home and avoid contact with others until you get your results (4-5 days)  - Avoid travel on public transportation if possible (such as bus, train, or airplane) or o Sent to the Emergency Department by EMS for evaluation, COVID-19 testing, and possible admission depending on your condition and test results.  What to do if you are LOW RISK for COVID-19?  Reduce your risk of any infection by using the same precautions used for avoiding the common cold or flu:  . Wash your hands often with soap and warm water for at least 20 seconds.  If soap and water are not readily available, use an alcohol-based hand sanitizer with at least 60% alcohol.  . If coughing or   sneezing, cover your mouth and nose by coughing or sneezing into the elbow areas of your shirt or coat, into a tissue or into your sleeve (not your hands). . Avoid shaking hands with others and consider head nods or verbal greetings only. . Avoid touching your eyes, nose, or mouth with unwashed hands.  . Avoid close contact with people who are sick. . Avoid places or events with large numbers of people in one location, like concerts or  sporting events. . Carefully consider travel plans you have or are making. . If you are planning any travel outside or inside the [place], visit the CDC's Travelers' Health webpage for the latest health notices. . If you have some symptoms but not all symptoms, continue to monitor at home and seek medical attention if your symptoms worsen. . If you are having a medical emergency, call 911.   ADDITIONAL HEALTHCARE OPTIONS FOR PATIENTS  [facility] / e-Visit: [email or web address]         [facility]: [phone]   Urgent Care: [phone]                   [facility]: [phone]   

## 2019-05-23 ENCOUNTER — Other Ambulatory Visit: Payer: Self-pay | Admitting: Oncology

## 2019-06-01 ENCOUNTER — Other Ambulatory Visit: Payer: Self-pay | Admitting: *Deleted

## 2019-06-01 DIAGNOSIS — Z6841 Body Mass Index (BMI) 40.0 and over, adult: Secondary | ICD-10-CM | POA: Diagnosis not present

## 2019-06-01 DIAGNOSIS — Z23 Encounter for immunization: Secondary | ICD-10-CM | POA: Diagnosis not present

## 2019-06-01 DIAGNOSIS — M170 Bilateral primary osteoarthritis of knee: Secondary | ICD-10-CM | POA: Diagnosis not present

## 2019-06-01 DIAGNOSIS — Z1211 Encounter for screening for malignant neoplasm of colon: Secondary | ICD-10-CM | POA: Diagnosis not present

## 2019-06-01 DIAGNOSIS — Z0000 Encounter for general adult medical examination without abnormal findings: Secondary | ICD-10-CM | POA: Diagnosis not present

## 2019-06-01 DIAGNOSIS — Z1339 Encounter for screening examination for other mental health and behavioral disorders: Secondary | ICD-10-CM | POA: Diagnosis not present

## 2019-06-01 DIAGNOSIS — Z1331 Encounter for screening for depression: Secondary | ICD-10-CM | POA: Diagnosis not present

## 2019-06-01 DIAGNOSIS — C50919 Malignant neoplasm of unspecified site of unspecified female breast: Secondary | ICD-10-CM

## 2019-06-01 MED ORDER — ALPRAZOLAM 1 MG PO TABS: 1.0000 mg | ORAL_TABLET | Freq: Three times a day (TID) | ORAL | 2 refills | Status: DC | PRN

## 2019-06-01 MED ORDER — TEMAZEPAM 30 MG PO CAPS: ORAL_CAPSULE | ORAL | 2 refills | Status: DC

## 2019-06-17 ENCOUNTER — Other Ambulatory Visit: Payer: Self-pay | Admitting: *Deleted

## 2019-06-17 DIAGNOSIS — D689 Coagulation defect, unspecified: Secondary | ICD-10-CM

## 2019-06-17 DIAGNOSIS — C50919 Malignant neoplasm of unspecified site of unspecified female breast: Secondary | ICD-10-CM

## 2019-06-18 ENCOUNTER — Inpatient Hospital Stay: Payer: Medicare Other

## 2019-06-18 ENCOUNTER — Inpatient Hospital Stay: Payer: Medicare Other | Attending: Oncology

## 2019-06-18 ENCOUNTER — Ambulatory Visit: Payer: Medicare Other

## 2019-06-18 ENCOUNTER — Other Ambulatory Visit: Payer: Medicare Other

## 2019-06-18 ENCOUNTER — Other Ambulatory Visit: Payer: Self-pay

## 2019-06-18 VITALS — BP 154/72 | HR 61 | Temp 98.0°F | Resp 18

## 2019-06-18 DIAGNOSIS — C50312 Malignant neoplasm of lower-inner quadrant of left female breast: Secondary | ICD-10-CM

## 2019-06-18 DIAGNOSIS — C50919 Malignant neoplasm of unspecified site of unspecified female breast: Secondary | ICD-10-CM

## 2019-06-18 DIAGNOSIS — D689 Coagulation defect, unspecified: Secondary | ICD-10-CM | POA: Insufficient documentation

## 2019-06-18 DIAGNOSIS — Z95828 Presence of other vascular implants and grafts: Secondary | ICD-10-CM

## 2019-06-18 DIAGNOSIS — C50911 Malignant neoplasm of unspecified site of right female breast: Secondary | ICD-10-CM | POA: Insufficient documentation

## 2019-06-18 DIAGNOSIS — C50812 Malignant neoplasm of overlapping sites of left female breast: Secondary | ICD-10-CM | POA: Diagnosis not present

## 2019-06-18 DIAGNOSIS — Z5112 Encounter for antineoplastic immunotherapy: Secondary | ICD-10-CM | POA: Diagnosis not present

## 2019-06-18 DIAGNOSIS — C50311 Malignant neoplasm of lower-inner quadrant of right female breast: Secondary | ICD-10-CM

## 2019-06-18 LAB — CMP (CANCER CENTER ONLY)
ALT: 12 U/L (ref 0–44)
AST: 11 U/L — ABNORMAL LOW (ref 15–41)
Albumin: 3.6 g/dL (ref 3.5–5.0)
Alkaline Phosphatase: 67 U/L (ref 38–126)
Anion gap: 10 (ref 5–15)
BUN: 26 mg/dL — ABNORMAL HIGH (ref 8–23)
CO2: 25 mmol/L (ref 22–32)
Calcium: 9.5 mg/dL (ref 8.9–10.3)
Chloride: 108 mmol/L (ref 98–111)
Creatinine: 1.05 mg/dL — ABNORMAL HIGH (ref 0.44–1.00)
GFR, Est AFR Am: >60 mL/min (ref >60)
GFR, Estimated: 55 mL/min — ABNORMAL LOW (ref >60)
Glucose, Bld: 95 mg/dL (ref 70–99)
Potassium: 4.1 mmol/L (ref 3.5–5.1)
Sodium: 143 mmol/L (ref 135–145)
Total Bilirubin: 0.7 mg/dL (ref 0.3–1.2)
Total Protein: 7.6 g/dL (ref 6.5–8.1)

## 2019-06-18 LAB — CBC WITH DIFFERENTIAL (CANCER CENTER ONLY)
Abs Immature Granulocytes: 0.03 10*3/uL (ref 0.00–0.07)
Basophils Absolute: 0.1 10*3/uL (ref 0.0–0.1)
Basophils Relative: 1 %
Eosinophils Absolute: 0.1 10*3/uL (ref 0.0–0.5)
Eosinophils Relative: 1 %
HCT: 33.7 % — ABNORMAL LOW (ref 36.0–46.0)
Hemoglobin: 11.4 g/dL — ABNORMAL LOW (ref 12.0–15.0)
Immature Granulocytes: 0 %
Lymphocytes Relative: 22 %
Lymphs Abs: 1.9 10*3/uL (ref 0.7–4.0)
MCH: 28.6 pg (ref 26.0–34.0)
MCHC: 33.8 g/dL (ref 30.0–36.0)
MCV: 84.5 fL (ref 80.0–100.0)
Monocytes Absolute: 0.6 10*3/uL (ref 0.1–1.0)
Monocytes Relative: 7 %
Neutro Abs: 6 10*3/uL (ref 1.7–7.7)
Neutrophils Relative %: 69 %
Platelet Count: 243 10*3/uL (ref 150–400)
RBC: 3.99 MIL/uL (ref 3.87–5.11)
RDW: 15 % (ref 11.5–15.5)
WBC Count: 8.7 10*3/uL (ref 4.0–10.5)
nRBC: 0 % (ref 0.0–0.2)

## 2019-06-18 LAB — PROTIME-INR
INR: 2.5 — ABNORMAL HIGH (ref 0.8–1.2)
Prothrombin Time: 26.4 seconds — ABNORMAL HIGH (ref 11.4–15.2)

## 2019-06-18 MED ORDER — SODIUM CHLORIDE 0.9% FLUSH
10.0000 mL | INTRAVENOUS | Status: DC | PRN
  Administered 2019-06-18: 10 mL
  Filled 2019-06-18: qty 10

## 2019-06-18 MED ORDER — SODIUM CHLORIDE 0.9 % IV SOLN
Freq: Once | INTRAVENOUS | Status: AC
  Administered 2019-06-18: 08:00:00 via INTRAVENOUS
  Filled 2019-06-18: qty 250

## 2019-06-18 MED ORDER — ACETAMINOPHEN 325 MG PO TABS
650.0000 mg | ORAL_TABLET | Freq: Once | ORAL | Status: AC
  Administered 2019-06-18: 650 mg via ORAL

## 2019-06-18 MED ORDER — HEPARIN SOD (PORK) LOCK FLUSH 100 UNIT/ML IV SOLN
500.0000 [IU] | Freq: Once | INTRAVENOUS | Status: AC | PRN
  Administered 2019-06-18: 10:00:00 500 [IU]
  Filled 2019-06-18: qty 5

## 2019-06-18 MED ORDER — LORAZEPAM 2 MG/ML IJ SOLN
1.0000 mg | Freq: Once | INTRAMUSCULAR | Status: AC | PRN
  Administered 2019-06-18: 1 mg via INTRAVENOUS

## 2019-06-18 MED ORDER — TRASTUZUMAB-DKST CHEMO 150 MG IV SOLR
750.0000 mg | Freq: Once | INTRAVENOUS | Status: AC
  Administered 2019-06-18: 750 mg via INTRAVENOUS
  Filled 2019-06-18: qty 35.72

## 2019-06-18 MED ORDER — ACETAMINOPHEN 325 MG PO TABS
ORAL_TABLET | ORAL | Status: AC
  Filled 2019-06-18: qty 2

## 2019-06-18 MED ORDER — LORAZEPAM 2 MG/ML IJ SOLN
INTRAMUSCULAR | Status: AC
  Filled 2019-06-18: qty 1

## 2019-06-18 MED ORDER — DIPHENHYDRAMINE HCL 25 MG PO CAPS
25.0000 mg | ORAL_CAPSULE | Freq: Once | ORAL | Status: AC
  Administered 2019-06-18: 25 mg via ORAL

## 2019-06-18 MED ORDER — DIPHENHYDRAMINE HCL 25 MG PO CAPS
ORAL_CAPSULE | ORAL | Status: AC
  Filled 2019-06-18: qty 1

## 2019-06-18 NOTE — Patient Instructions (Signed)
[facility] °Discharge Instructions for Patients Receiving Chemotherapy ° °Today you received the following chemotherapy agents Trastuzumab ° °To help prevent nausea and vomiting after your treatment, we encourage you to take your nausea medication as directed. °  °If you develop nausea and vomiting that is not controlled by your nausea medication, call the clinic.  ° °BELOW ARE SYMPTOMS THAT SHOULD BE REPORTED IMMEDIATELY: °· *FEVER GREATER THAN 100.5 F °· *CHILLS WITH OR WITHOUT FEVER °· NAUSEA AND VOMITING THAT IS NOT CONTROLLED WITH YOUR NAUSEA MEDICATION °· *UNUSUAL SHORTNESS OF BREATH °· *UNUSUAL BRUISING OR BLEEDING °· TENDERNESS IN MOUTH AND THROAT WITH OR WITHOUT PRESENCE OF ULCERS °· *URINARY PROBLEMS °· *BOWEL PROBLEMS °· UNUSUAL RASH °Items with * indicate a potential emergency and should be followed up as soon as possible. ° °Feel free to call the clinic should you have any questions or concerns. The clinic phone number is [phone]. ° °Please show the CHEMO ALERT CARD at check-in to the Emergency Department and triage nurse. ° ° °

## 2019-07-07 ENCOUNTER — Other Ambulatory Visit (HOSPITAL_COMMUNITY): Payer: Self-pay

## 2019-07-07 MED ORDER — SPIRONOLACTONE 25 MG PO TABS: ORAL_TABLET | ORAL | 5 refills | Status: DC

## 2019-07-15 ENCOUNTER — Other Ambulatory Visit: Payer: Self-pay | Admitting: Lab

## 2019-07-16 ENCOUNTER — Ambulatory Visit: Payer: Medicare Other

## 2019-07-16 ENCOUNTER — Other Ambulatory Visit: Payer: Medicare Other

## 2019-07-16 ENCOUNTER — Inpatient Hospital Stay: Payer: Medicare Other | Attending: Oncology

## 2019-07-16 ENCOUNTER — Inpatient Hospital Stay: Payer: Medicare Other

## 2019-07-16 ENCOUNTER — Other Ambulatory Visit: Payer: Self-pay | Admitting: *Deleted

## 2019-07-16 ENCOUNTER — Other Ambulatory Visit: Payer: Self-pay

## 2019-07-16 VITALS — HR 71 | Temp 97.8°F | Resp 20 | Wt 275.0 lb

## 2019-07-16 DIAGNOSIS — C50812 Malignant neoplasm of overlapping sites of left female breast: Secondary | ICD-10-CM | POA: Diagnosis present

## 2019-07-16 DIAGNOSIS — C50919 Malignant neoplasm of unspecified site of unspecified female breast: Secondary | ICD-10-CM

## 2019-07-16 DIAGNOSIS — Z5112 Encounter for antineoplastic immunotherapy: Secondary | ICD-10-CM | POA: Diagnosis not present

## 2019-07-16 DIAGNOSIS — C50912 Malignant neoplasm of unspecified site of left female breast: Secondary | ICD-10-CM | POA: Insufficient documentation

## 2019-07-16 DIAGNOSIS — Z79899 Other long term (current) drug therapy: Secondary | ICD-10-CM | POA: Diagnosis not present

## 2019-07-16 DIAGNOSIS — Z131 Encounter for screening for diabetes mellitus: Secondary | ICD-10-CM | POA: Diagnosis not present

## 2019-07-16 DIAGNOSIS — C7951 Secondary malignant neoplasm of bone: Secondary | ICD-10-CM | POA: Insufficient documentation

## 2019-07-16 DIAGNOSIS — C787 Secondary malignant neoplasm of liver and intrahepatic bile duct: Secondary | ICD-10-CM | POA: Diagnosis not present

## 2019-07-16 DIAGNOSIS — C50911 Malignant neoplasm of unspecified site of right female breast: Secondary | ICD-10-CM | POA: Insufficient documentation

## 2019-07-16 DIAGNOSIS — I10 Essential (primary) hypertension: Secondary | ICD-10-CM | POA: Diagnosis not present

## 2019-07-16 DIAGNOSIS — Z9221 Personal history of antineoplastic chemotherapy: Secondary | ICD-10-CM | POA: Insufficient documentation

## 2019-07-16 DIAGNOSIS — Z923 Personal history of irradiation: Secondary | ICD-10-CM | POA: Insufficient documentation

## 2019-07-16 DIAGNOSIS — Z9013 Acquired absence of bilateral breasts and nipples: Secondary | ICD-10-CM | POA: Insufficient documentation

## 2019-07-16 DIAGNOSIS — Z171 Estrogen receptor negative status [ER-]: Secondary | ICD-10-CM | POA: Diagnosis not present

## 2019-07-16 DIAGNOSIS — Z7901 Long term (current) use of anticoagulants: Secondary | ICD-10-CM | POA: Diagnosis not present

## 2019-07-16 DIAGNOSIS — D689 Coagulation defect, unspecified: Secondary | ICD-10-CM

## 2019-07-16 LAB — PROTIME-INR
INR: 2.8 — ABNORMAL HIGH (ref 0.8–1.2)
Prothrombin Time: 29.3 seconds — ABNORMAL HIGH (ref 11.4–15.2)

## 2019-07-16 MED ORDER — SODIUM CHLORIDE 0.9% FLUSH
10.0000 mL | INTRAVENOUS | Status: DC | PRN
  Administered 2019-07-16: 10 mL
  Filled 2019-07-16: qty 10

## 2019-07-16 MED ORDER — TRASTUZUMAB-DKST CHEMO 150 MG IV SOLR
750.0000 mg | Freq: Once | INTRAVENOUS | Status: AC
  Administered 2019-07-16: 750 mg via INTRAVENOUS
  Filled 2019-07-16: qty 35.72

## 2019-07-16 MED ORDER — ACETAMINOPHEN 325 MG PO TABS
ORAL_TABLET | ORAL | Status: AC
  Filled 2019-07-16: qty 2

## 2019-07-16 MED ORDER — LORAZEPAM 2 MG/ML IJ SOLN
INTRAMUSCULAR | Status: AC
  Filled 2019-07-16: qty 1

## 2019-07-16 MED ORDER — SODIUM CHLORIDE 0.9 % IV SOLN
Freq: Once | INTRAVENOUS | Status: AC
  Administered 2019-07-16: 09:00:00 via INTRAVENOUS
  Filled 2019-07-16: qty 250

## 2019-07-16 MED ORDER — DIPHENHYDRAMINE HCL 25 MG PO CAPS
ORAL_CAPSULE | ORAL | Status: AC
  Filled 2019-07-16: qty 1

## 2019-07-16 MED ORDER — ACETAMINOPHEN 325 MG PO TABS
650.0000 mg | ORAL_TABLET | Freq: Once | ORAL | Status: AC
  Administered 2019-07-16: 650 mg via ORAL

## 2019-07-16 MED ORDER — HEPARIN SOD (PORK) LOCK FLUSH 100 UNIT/ML IV SOLN
500.0000 [IU] | Freq: Once | INTRAVENOUS | Status: AC | PRN
  Administered 2019-07-16: 500 [IU]
  Filled 2019-07-16: qty 5

## 2019-07-16 MED ORDER — DIPHENHYDRAMINE HCL 25 MG PO CAPS
25.0000 mg | ORAL_CAPSULE | Freq: Once | ORAL | Status: AC
  Administered 2019-07-16: 25 mg via ORAL

## 2019-07-16 MED ORDER — LORAZEPAM 2 MG/ML IJ SOLN
1.0000 mg | Freq: Once | INTRAMUSCULAR | Status: AC | PRN
  Administered 2019-07-16: 1 mg via INTRAVENOUS

## 2019-07-16 NOTE — Patient Instructions (Signed)
[facility] °Discharge Instructions for Patients Receiving Chemotherapy ° °Today you received the following chemotherapy agents Trastuzumab ° °To help prevent nausea and vomiting after your treatment, we encourage you to take your nausea medication as directed. °  °If you develop nausea and vomiting that is not controlled by your nausea medication, call the clinic.  ° °BELOW ARE SYMPTOMS THAT SHOULD BE REPORTED IMMEDIATELY: °· *FEVER GREATER THAN 100.5 F °· *CHILLS WITH OR WITHOUT FEVER °· NAUSEA AND VOMITING THAT IS NOT CONTROLLED WITH YOUR NAUSEA MEDICATION °· *UNUSUAL SHORTNESS OF BREATH °· *UNUSUAL BRUISING OR BLEEDING °· TENDERNESS IN MOUTH AND THROAT WITH OR WITHOUT PRESENCE OF ULCERS °· *URINARY PROBLEMS °· *BOWEL PROBLEMS °· UNUSUAL RASH °Items with * indicate a potential emergency and should be followed up as soon as possible. ° °Feel free to call the clinic should you have any questions or concerns. The clinic phone number is [phone]. ° °Please show the CHEMO ALERT CARD at check-in to the Emergency Department and triage nurse. ° ° °

## 2019-08-12 ENCOUNTER — Other Ambulatory Visit: Payer: Self-pay | Admitting: *Deleted

## 2019-08-12 DIAGNOSIS — C50919 Malignant neoplasm of unspecified site of unspecified female breast: Secondary | ICD-10-CM

## 2019-08-12 NOTE — Progress Notes (Signed)
[facility]  [phone] [phone]) [phone]    ID: [name]   DOB: Jun 20, 1951  MR#: [id number]  TDV#:[id number]   Patient Care Team: Ernestene Kiel, MD as PCP - General (Internal Medicine) Haze Rushing., MD as Consulting Physician (Pain Medicine) Joya Salm, MD as Referring Physician (Orthopedic Surgery) Jaymes Graff, DO as Consulting Physician (Orthopedic Surgery) Larey Dresser, MD as Consulting Physician (Cardiology) Bensimhon, Shaune Pascal, MD as Consulting Physician (Cardiology) Lorelle Gibbs, MD (Radiology) Tommy Medal, Lavell Islam, MD as Consulting Physician (Infectious Diseases) Gery Pray, MD as Consulting Physician (Radiation Oncology) Amillya Chavira, Virgie Dad, MD as Consulting Physician (Oncology)   CHIEF COMPLAINT: Metastatic HER-2 positive, estrogen receptor negative breast cancer (s/p bilateral mastectomies)  CURRENT TREATMENT: Trastuzumab every 28 days; lifelong warfarin   INTERVAL HISTORY: [name] returns today for follow-up and treatment of her estrogen receptor negative, but HER2 amplified breast cancer. She continues on trastuzumab, which she receives every 28 days.  She has absolutely no side effects related to this that she is aware of.. Her most recent echocardiogram on 03/02/2019 showed an ejection fraction of 60-65%.  She is on lifelong warfarin. She tolerates this well and without any noticeable side effects. Her PT:INR is generally in range: Lab Results  Component Value Date   INR 1.8 (H) 08/13/2019   INR 2.8 (H) 07/16/2019   INR 2.5 (H) 06/18/2019   INR 2.5 (H) 05/21/2019   INR 1.6 (H) 04/23/2019   We had placed an order for her to have had a PET scan prior to this visit but she says she was never called.   REVIEW OF SYSTEMS: [name] and her husband are taking appropriate pandemic precautions.  For the Thanksgiving holiday it was pretty much just them at home and it is going to be the same for Christmas she  says.  She recently did extensive leaf blowing and then hurt for several days in her shoulders and some in her left axilla.  She has had no cough no phlegm production no shortness of breath no pleurisy no unusual headaches no visual changes no nausea or vomiting no gait imbalance.  She tells me she is very careful to avoid falls.  There has been no change in bowel or bladder habits.  She has had no bleeding or bruising.  A detailed review of systems today was otherwise stable.   BREAST CANCER HISTORY:   From the earlier summary:  [name] is 68 years old [name] female.  This woman has been in good health all of her life.  She noted a swelling and discomfort in her right breast in June 2004.  She was seen in the Emergency Room in [place] and was treated for mastitis.  She was treated for a number of months with mastitis and the swelling did not get better. She was given hydrocodone and Cipro.  Finally, the swelling did get better and ultimately the nipple became retracted and she noticed some dimpling in her skin. She had a mammogram in July of 2004 in [place] with subsequent mammogram on May 27, 2003, by Dr. Isaiah Blakes.  Mammogram done on September 30 showed marked increased density in the left breast.  Biopsy was performed the same day.  It was noted at the 12 o'clock position, deep in the breast was a focal hypoechoic mass, at least 3.5 cm in diameter.  Biopsy did in fact show invasive in situ mammary carcinoma. This was felt to be both at least intermediate, high  grade.  No definite lymphovascular invasion was identified. ER and PR negative, Her2 testing positive. [name] continues to have pain in her breast.  She continues to take hydrocodone a number of times a day.  She has been seen by Dr. Rosana Hoes, who felt that neoadjuvant chemotherapy would be required.    Initial staging studies showed evidence of liver and lung mets.   Patient also has evidence of bone lesions. Patient started  neoadjuvant chemotherapy, Taxotere/Carbo/Herceptin in October 2004.   Patient had a CT scan in December 2004 which demonstrated extensive clot in the SVC innominate vein, bilateral jugular vein and  She was started on anticoagulation therapy. She received a total of 6 cycles of Taxotere/Carbo/Herceptin, completed in April 2005."  Patient has been on  trastuzumab continued indefinitely; has also received lapatinib and capecitabine for variable intervals in 2007-2008. Most recent echo 12/01/2013 showed an ejection fraction of 55%. She is status post bilateral mastectomies with bilateral axillary lymph node dissection 12/07/2004, showing (a) on the right, a mypT1c ypN1 invasive ductal carcinoma, grade 3, estrogen and progesterone receptor negative, HER-2 positive, with an MIB-1 of 31% (b) on the left, ypT2 ypN1 invasive ductal carcinoma, grade 2, estrogen and progesterone receptor negative, HER-2 positive, with an MIB-1 of 35%. She is Status post radiation June through July of 2006, to the right chest wall, left chest wall, bilateral supraclavicular fossae, and bilateral axillary boosts; with additional radiation to the right and left chest walls and the central chest wall completed November of 2007. She is status post ixempra x9 completed August of 2009. 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 Patient has chronic pain, with negative PET scan 08/24/2013 (no evidence of active cancer). On Neurontin and Tramadol therapy.  Her subsequent history is as detailed below   PAST MEDICAL HISTORY: Past Medical History:  Diagnosis Date  . Breast cancer ([facility])    mets to liver and lung  . Breast cancer metastasized to multiple sites ([facility]) 02/26/2013  . History of chemotherapy Feb. 2006   taxotere/herceptin/carboplatin  . Hypertension   . Neuropathy   . Radiation 07/31/2006   left upper chest  . Radiation 06/17/2006-06/27/2006    6480 cGy bilat. chest wall  . SVC syndrome   . Thrombosis     PAST SURGICAL HISTORY: Past Surgical History:  Procedure Laterality Date  . ANKLE SURGERY    . BACK SURGERY    . CHOLECYSTECTOMY  1989  . MASTECTOMY Bilateral   . PERIPHERALLY INSERTED CENTRAL CATHETER INSERTION    . TUBAL LIGATION  1986    FAMILY HISTORY Family History  Problem Relation Age of Onset  . Heart failure Father   . Cancer Father        Prostate cancer  . Heart failure Brother   . Cancer Brother        Prostate cancer  . Diabetes Maternal Aunt   She had three brothers, one died of gunshot wound, one of complications of diabetes mellitus and one of myocardial infarction.  She has no sisters.  Mother died of complications of brain metastasis in 71.  Father has had a myocardial infarction in 1999.  No history of breast or ovarian cancer in the family.     GYNECOLOGIC HISTORY:    No LMP recorded. Patient is postmenopausal. Menarche at age 80.   Gravida 3, para 3.   First live birth at age 15.   No history of breast feeding.  No history of hormonal replacement therapy.    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 He used to work as a Art gallery manager, but is now retired and disabled. The patient has three children, Monette who lives in [place] and works as a Hydrographic surveyor, Financial risk analyst who lives in [place] and works as a Administrator, and Padroni who lives in [place] and also works as a Hydrographic surveyor. The patient has 14 grandchildren and 4 great-grandchildren. She attends a [employer]. Very involved with school kids.  ADVANCED DIRECTIVES:  Not in place   HEALTH MAINTENANCE: (Updated 06/12/2013) Social History   Tobacco Use  . Smoking status: Never Smoker  . Smokeless tobacco: Never Used  Substance Use Topics  . Alcohol use: Yes    Comment:  occasional  . Drug use: No     Colonoscopy: Never and "I don't want one"  PAP:  1987  Bone density:  Never   Lipid panel:  Not on file   Allergies  Allergen Reactions  . Penicillins Hives    PATIENT HAS TOLERATED CEPHALOSPORINGS  . Adhesive [Tape] Other (See Comments)    Tears skin     Current Outpatient Medications  Medication Sig Dispense Refill  . acetaminophen (TYLENOL) 500 MG tablet Take 1,000 mg by mouth every 6 (six) hours as needed for mild pain or fever.     Marland Kitchen albuterol (PROVENTIL HFA;VENTOLIN HFA) 108 (90 BASE) MCG/ACT inhaler Inhale 2 puffs into the lungs every 6 (six) hours as needed for wheezing. 1 Inhaler 5  . ALPRAZolam (XANAX) 1 MG tablet Take 1 tablet (1 mg total) by mouth 3 (three) times daily as needed. for anxiety 60 tablet 2  . amLODipine (NORVASC) 10 MG tablet Take 10 mg by mouth every morning.    . baclofen (LIORESAL) 10 MG tablet TAKE 1 TABLET BY MOUTH THREE TIMES DAILY AS NEEDED FOR MUSCLE SPASMS 270 tablet 0  . carvedilol (COREG) 12.5 MG tablet Take 1 tablet (12.5 mg total) by mouth 2 (two) times daily. 60 tablet 6  . diclofenac sodium (VOLTAREN) 1 % GEL Apply 2 g topically daily as needed (for pain). Apply to knees and shoulders 100 g 6  . furosemide (LASIX) 40 MG tablet TAKE 1 TABLET(40 MG) BY MOUTH DAILY 30 tablet 3  . gabapentin (NEURONTIN) 300 MG capsule TAKE 2 CAPSULES(600 MG) BY MOUTH THREE TIMES DAILY 540 capsule 0  . losartan (COZAAR) 100 MG tablet Take 100 mg by mouth every morning.    . potassium chloride (K-DUR,KLOR-CON) 20 MEQ tablet Take 1 tablet (20 mEq total) by mouth daily. 30 tablet 3  . predniSONE (DELTASONE) 5 MG tablet     . spironolactone (ALDACTONE) 25 MG tablet TAKE 1 TABLET(25 MG) BY MOUTH DAILY 30 tablet 5  . temazepam (RESTORIL) 30 MG capsule TAKE ONE CAPSULE BY MOUTH EVERY NIGHT AT BEDTIME AS NEEDED FOR SLEEP 30 capsule 2  . warfarin (COUMADIN) 5 MG tablet Take one tablet on odd numbered days and one and a half tablets on even  numbered days 90 tablet 4   No current facility-administered medications for this visit.   Facility-Administered Medications Ordered in Other Visits  Medication Dose Route Frequency Provider Last Rate Last Admin  . sodium chloride flush (NS) 0.9 % injection 10 mL  10 mL Intravenous PRN Everest Hacking, Virgie Dad, MD   10 mL  at 12/15/15 1200  . sodium chloride flush (NS) 0.9 % injection 10 mL  10 mL Intracatheter PRN Whittney Steenson, Virgie Dad, MD   10 mL at 08/14/18 1116  . sodium chloride flush (NS) 0.9 % injection 10 mL  10 mL Intracatheter PRN Rosa Wyly, Virgie Dad, MD        OBJECTIVE: Morbidly obese African-American woman who appears stated age   Vitals:   08/13/19 0935  BP: (!) 155/81  Pulse: 80  Resp: 18  Temp: 97.8 F (36.6 C)  SpO2: 98%     Body mass index is 47.23 kg/m.    ECOG FS: 1 Filed Weights   08/13/19 0935  Weight: 266 lb 9.6 oz (120.9 kg)    Sclerae unicteric, EOMs intact Wearing a mask No cervical or supraclavicular adenopathy Lungs no rales or rhonchi Heart regular rate and rhythm Abd soft, nontender, positive bowel sounds MSK no focal spinal tenderness, no upper extremity lymphedema Neuro: nonfocal, well oriented, appropriate affect Breasts: Status post bilateral mastectomies.  There is no evidence of chest wall recurrence.  Both axillae are benign   LAB RESULTS: Lab Results  Component Value Date   WBC 10.7 (H) 08/13/2019   NEUTROABS 8.7 (H) 08/13/2019   HGB 12.1 08/13/2019   HCT 35.1 (L) 08/13/2019   MCV 81.4 08/13/2019   PLT 259 08/13/2019      Chemistry      Component Value Date/Time   NA 144 08/13/2019 0846   NA 143 08/14/2017 0811   K 3.8 08/13/2019 0846   K 3.8 08/14/2017 0811   CL 108 08/13/2019 0846   CL 104 01/30/2013 0850   CO2 21 (L) 08/13/2019 0846   CO2 24 08/14/2017 0811   BUN 23 08/13/2019 0846   BUN 14.9 08/14/2017 0811   CREATININE 1.03 (H) 08/13/2019 0846   CREATININE 0.9 08/14/2017 0811      Component Value Date/Time   CALCIUM  9.6 08/13/2019 0846   CALCIUM 9.4 08/14/2017 0811   ALKPHOS 74 08/13/2019 0846   ALKPHOS 73 08/14/2017 0811   AST 15 08/13/2019 0846   AST 10 08/14/2017 0811   ALT 14 08/13/2019 0846   ALT 10 08/14/2017 0811   BILITOT 1.1 08/13/2019 0846   BILITOT 0.62 08/14/2017 0811      STUDIES: No results found.   ASSESSMENT: 68 y.o.  [place], [place], woman  (1)  with a history of inflammatory right breast cancer metastatic at presentation September 2004 with involvement of liver and bone, HER-2 positive, estrogen and progesterone receptor negative  (2) treated with carboplatin, docetaxel and Herceptin x 6 completed April 2005  (3) trastuzumab continued indefinitely;   (a) has also received lapatinib and capecitabine for variable intervals in 2007-2008.  (b) Every 6 month echo: 09/24/2017 showed an ejection fraction in the 55-60%  (c) echocardiogram 03/07/2018 showed an ejection fraction in the 55-60%  (d) cardiogram 09/30/2018 showed ejection fraction in the 60-65% range.  (4) status post bilateral mastectomies with bilateral axillary lymph node dissection 12/07/2004, showing  (a) on the right, a mypT1c ypN1 invasive ductal carcinoma, grade 3, estrogen and progesterone receptor negative, HER-2 positive, with an MIB-1 of 31%  (b) on the left, ypT2 ypN1 invasive ductal carcinoma, grade 2, estrogen and progesterone receptor negative, HER-2 positive, with an MIB-1 of 35%.  (5)  Status post radiation June through July of 2006, to the right chest wall, left chest wall, bilateral supraclavicular fossae, and bilateral axillary boosts; with additional radiation to the right and left  chest walls and the central chest wall completed November of 2007  (6) status post Ixempra x9 completed August of 2009.  (7) history of superior vena caval syndrome, on life long anticoagulation   (8)  History of chemotherapy-induced neuropathy.   (9)  chronic pain, with negative PET scan 08/24/2013 (no  evidence of active cancer). On Neurontin and Tramadol  (a) repeat PET scan December 2016 again negative.  (b) Repeat PET scan 04/23/2017 shows no active malignancy  (c) PET scan 07/09/2018 shows no evidence of active malignancy.  (10) right upper extremity cellulitis, no bacteremia; treated with cephalexin /doxycycline for 2 weeks, with resolution  (11) subacute fracture right humerus, nondisplaced, status post fall 03/27/2017   PLAN: [name] is now more than 14 years out from definitive surgery for her breast cancer and more than 16 years out from initial diagnosis of metastatic breast cancer.  There is no evidence of active disease.  This is very favorable.  She is tolerating trastuzumab well.  She receives this every 28 days.  She has echocardiography every 6 months.  We are making no changes in this plan.  She is on lifelong Coumadin for history of DVTs.  Her INR vacillates between 1.8 and 2.4 depending on her diet.  I am not making any changes today.  We continue to follow that on a monthly basis.  She knows to call if any bleeding or bruising develops.  They are taking appropriate pandemic precautions  She was supposed to have had a PET scan prior to this visit.  We will see if it can be done right after Christmas.  Otherwise she will continue her monthly treatments with visits every 3 months.  She will be due for repeat echocardiography sometime early next year  She knows to call for any other issue that may develop before then.   Latisia Hilaire, Virgie Dad, MD  08/13/19 9:52 AM Medical Oncology and Hematology [facility] [place], [place] [address] Tel. [phone]    Fax. [phone]   I, Wilburn Mylar, am acting as scribe for Dr. Virgie Dad. Mattalynn Crandle.  I, Lurline Del MD, have reviewed the above documentation for accuracy and completeness, and I agree with the above.

## 2019-08-13 ENCOUNTER — Inpatient Hospital Stay (HOSPITAL_BASED_OUTPATIENT_CLINIC_OR_DEPARTMENT_OTHER): Payer: Medicare Other | Admitting: Oncology

## 2019-08-13 ENCOUNTER — Ambulatory Visit: Payer: Medicare Other | Admitting: Oncology

## 2019-08-13 ENCOUNTER — Other Ambulatory Visit: Payer: Medicare Other

## 2019-08-13 ENCOUNTER — Inpatient Hospital Stay: Payer: Medicare Other | Attending: Oncology

## 2019-08-13 ENCOUNTER — Inpatient Hospital Stay: Payer: Medicare Other

## 2019-08-13 ENCOUNTER — Ambulatory Visit: Payer: Medicare Other

## 2019-08-13 ENCOUNTER — Other Ambulatory Visit: Payer: Self-pay

## 2019-08-13 VITALS — BP 155/81 | HR 80 | Temp 97.8°F | Resp 18 | Ht 63.0 in | Wt 266.6 lb

## 2019-08-13 DIAGNOSIS — Z7901 Long term (current) use of anticoagulants: Secondary | ICD-10-CM | POA: Diagnosis not present

## 2019-08-13 DIAGNOSIS — C50812 Malignant neoplasm of overlapping sites of left female breast: Secondary | ICD-10-CM | POA: Diagnosis present

## 2019-08-13 DIAGNOSIS — Z7189 Other specified counseling: Secondary | ICD-10-CM | POA: Insufficient documentation

## 2019-08-13 DIAGNOSIS — Z171 Estrogen receptor negative status [ER-]: Secondary | ICD-10-CM | POA: Insufficient documentation

## 2019-08-13 DIAGNOSIS — Z5112 Encounter for antineoplastic immunotherapy: Secondary | ICD-10-CM | POA: Insufficient documentation

## 2019-08-13 DIAGNOSIS — C50919 Malignant neoplasm of unspecified site of unspecified female breast: Secondary | ICD-10-CM

## 2019-08-13 DIAGNOSIS — Z86718 Personal history of other venous thrombosis and embolism: Secondary | ICD-10-CM | POA: Diagnosis not present

## 2019-08-13 DIAGNOSIS — C50911 Malignant neoplasm of unspecified site of right female breast: Secondary | ICD-10-CM | POA: Insufficient documentation

## 2019-08-13 DIAGNOSIS — C50912 Malignant neoplasm of unspecified site of left female breast: Secondary | ICD-10-CM

## 2019-08-13 LAB — CBC WITH DIFFERENTIAL (CANCER CENTER ONLY)
Abs Immature Granulocytes: 0.04 10*3/uL (ref 0.00–0.07)
Basophils Absolute: 0 10*3/uL (ref 0.0–0.1)
Basophils Relative: 0 %
Eosinophils Absolute: 0 10*3/uL (ref 0.0–0.5)
Eosinophils Relative: 0 %
HCT: 35.1 % — ABNORMAL LOW (ref 36.0–46.0)
Hemoglobin: 12.1 g/dL (ref 12.0–15.0)
Immature Granulocytes: 0 %
Lymphocytes Relative: 14 %
Lymphs Abs: 1.5 10*3/uL (ref 0.7–4.0)
MCH: 28.1 pg (ref 26.0–34.0)
MCHC: 34.5 g/dL (ref 30.0–36.0)
MCV: 81.4 fL (ref 80.0–100.0)
Monocytes Absolute: 0.4 10*3/uL (ref 0.1–1.0)
Monocytes Relative: 4 %
Neutro Abs: 8.7 10*3/uL — ABNORMAL HIGH (ref 1.7–7.7)
Neutrophils Relative %: 82 %
Platelet Count: 259 10*3/uL (ref 150–400)
RBC: 4.31 MIL/uL (ref 3.87–5.11)
RDW: 15.4 % (ref 11.5–15.5)
WBC Count: 10.7 10*3/uL — ABNORMAL HIGH (ref 4.0–10.5)
nRBC: 0 % (ref 0.0–0.2)

## 2019-08-13 LAB — CMP (CANCER CENTER ONLY)
ALT: 14 U/L (ref 0–44)
AST: 15 U/L (ref 15–41)
Albumin: 3.9 g/dL (ref 3.5–5.0)
Alkaline Phosphatase: 74 U/L (ref 38–126)
Anion gap: 15 (ref 5–15)
BUN: 23 mg/dL (ref 8–23)
CO2: 21 mmol/L — ABNORMAL LOW (ref 22–32)
Calcium: 9.6 mg/dL (ref 8.9–10.3)
Chloride: 108 mmol/L (ref 98–111)
Creatinine: 1.03 mg/dL — ABNORMAL HIGH (ref 0.44–1.00)
GFR, Est AFR Am: >60 mL/min (ref >60)
GFR, Estimated: 56 mL/min — ABNORMAL LOW (ref >60)
Glucose, Bld: 113 mg/dL — ABNORMAL HIGH (ref 70–99)
Potassium: 3.8 mmol/L (ref 3.5–5.1)
Sodium: 144 mmol/L (ref 135–145)
Total Bilirubin: 1.1 mg/dL (ref 0.3–1.2)
Total Protein: 7.9 g/dL (ref 6.5–8.1)

## 2019-08-13 LAB — PROTIME-INR
INR: 1.8 — ABNORMAL HIGH (ref 0.8–1.2)
Prothrombin Time: 20.4 seconds — ABNORMAL HIGH (ref 11.4–15.2)

## 2019-08-13 MED ORDER — TRASTUZUMAB-DKST CHEMO 150 MG IV SOLR
750.0000 mg | Freq: Once | INTRAVENOUS | Status: AC
  Administered 2019-08-13: 11:00:00 750 mg via INTRAVENOUS
  Filled 2019-08-13: qty 35.72

## 2019-08-13 MED ORDER — HEPARIN SOD (PORK) LOCK FLUSH 100 UNIT/ML IV SOLN
500.0000 [IU] | Freq: Once | INTRAVENOUS | Status: AC | PRN
  Administered 2019-08-13: 500 [IU]
  Filled 2019-08-13: qty 5

## 2019-08-13 MED ORDER — ACETAMINOPHEN 325 MG PO TABS
ORAL_TABLET | ORAL | Status: AC
  Filled 2019-08-13: qty 2

## 2019-08-13 MED ORDER — ACETAMINOPHEN 325 MG PO TABS
650.0000 mg | ORAL_TABLET | Freq: Once | ORAL | Status: AC
  Administered 2019-08-13: 650 mg via ORAL

## 2019-08-13 MED ORDER — LORAZEPAM 2 MG/ML IJ SOLN
INTRAMUSCULAR | Status: AC
  Filled 2019-08-13: qty 1

## 2019-08-13 MED ORDER — LORAZEPAM 2 MG/ML IJ SOLN
1.0000 mg | Freq: Once | INTRAMUSCULAR | Status: AC | PRN
  Administered 2019-08-13: 10:00:00 1 mg via INTRAVENOUS

## 2019-08-13 MED ORDER — SODIUM CHLORIDE 0.9% FLUSH
10.0000 mL | INTRAVENOUS | Status: DC | PRN
  Administered 2019-08-13: 10 mL
  Filled 2019-08-13: qty 10

## 2019-08-13 MED ORDER — SODIUM CHLORIDE 0.9 % IV SOLN
Freq: Once | INTRAVENOUS | Status: AC
  Filled 2019-08-13: qty 250

## 2019-08-13 MED ORDER — DIPHENHYDRAMINE HCL 25 MG PO CAPS
25.0000 mg | ORAL_CAPSULE | Freq: Once | ORAL | Status: AC
  Administered 2019-08-13: 10:00:00 25 mg via ORAL

## 2019-08-13 MED ORDER — DIPHENHYDRAMINE HCL 25 MG PO CAPS
ORAL_CAPSULE | ORAL | Status: AC
  Filled 2019-08-13: qty 1

## 2019-08-13 NOTE — Patient Instructions (Signed)
[facility] Discharge Instructions for Patients Receiving Chemotherapy  Today you received the following chemotherapy agents trastuzumab.  To help prevent nausea and vomiting after your treatment, we encourage you to take your nausea medication as directed.    If you develop nausea and vomiting that is not controlled by your nausea medication, call the clinic.   BELOW ARE SYMPTOMS THAT SHOULD BE REPORTED IMMEDIATELY:  *FEVER GREATER THAN 100.5 F  *CHILLS WITH OR WITHOUT FEVER  NAUSEA AND VOMITING THAT IS NOT CONTROLLED WITH YOUR NAUSEA MEDICATION  *UNUSUAL SHORTNESS OF BREATH  *UNUSUAL BRUISING OR BLEEDING  TENDERNESS IN MOUTH AND THROAT WITH OR WITHOUT PRESENCE OF ULCERS  *URINARY PROBLEMS  *BOWEL PROBLEMS  UNUSUAL RASH Items with * indicate a potential emergency and should be followed up as soon as possible.  Feel free to call the clinic should you have any questions or concerns. The clinic phone number is [phone].  Please show the CHEMO ALERT CARD at check-in to the Emergency Department and triage nurse.   

## 2019-08-14 ENCOUNTER — Telehealth: Payer: Self-pay | Admitting: Oncology

## 2019-08-14 NOTE — Telephone Encounter (Signed)
I talk with patient regarding schedule  

## 2019-08-22 ENCOUNTER — Other Ambulatory Visit: Payer: Self-pay | Admitting: Oncology

## 2019-08-22 DIAGNOSIS — M25512 Pain in left shoulder: Secondary | ICD-10-CM | POA: Diagnosis not present

## 2019-09-01 DIAGNOSIS — Z6841 Body Mass Index (BMI) 40.0 and over, adult: Secondary | ICD-10-CM | POA: Diagnosis not present

## 2019-09-01 DIAGNOSIS — I10 Essential (primary) hypertension: Secondary | ICD-10-CM | POA: Diagnosis not present

## 2019-09-01 DIAGNOSIS — M25512 Pain in left shoulder: Secondary | ICD-10-CM | POA: Diagnosis not present

## 2019-09-09 ENCOUNTER — Other Ambulatory Visit: Payer: Self-pay | Admitting: Oncology

## 2019-09-10 ENCOUNTER — Other Ambulatory Visit: Payer: Self-pay

## 2019-09-10 ENCOUNTER — Other Ambulatory Visit: Payer: Self-pay | Admitting: *Deleted

## 2019-09-10 ENCOUNTER — Inpatient Hospital Stay: Payer: Medicare Other

## 2019-09-10 ENCOUNTER — Inpatient Hospital Stay: Payer: Medicare Other | Attending: Oncology

## 2019-09-10 VITALS — BP 131/71 | HR 61 | Temp 97.9°F | Resp 18 | Wt 274.0 lb

## 2019-09-10 DIAGNOSIS — Z7901 Long term (current) use of anticoagulants: Secondary | ICD-10-CM | POA: Diagnosis not present

## 2019-09-10 DIAGNOSIS — C50911 Malignant neoplasm of unspecified site of right female breast: Secondary | ICD-10-CM | POA: Insufficient documentation

## 2019-09-10 DIAGNOSIS — C50919 Malignant neoplasm of unspecified site of unspecified female breast: Secondary | ICD-10-CM

## 2019-09-10 DIAGNOSIS — Z5112 Encounter for antineoplastic immunotherapy: Secondary | ICD-10-CM | POA: Diagnosis not present

## 2019-09-10 DIAGNOSIS — C50812 Malignant neoplasm of overlapping sites of left female breast: Secondary | ICD-10-CM | POA: Insufficient documentation

## 2019-09-10 LAB — CMP (CANCER CENTER ONLY)
ALT: 8 U/L (ref 0–44)
AST: 9 U/L — ABNORMAL LOW (ref 15–41)
Albumin: 3.7 g/dL (ref 3.5–5.0)
Alkaline Phosphatase: 69 U/L (ref 38–126)
Anion gap: 11 (ref 5–15)
BUN: 22 mg/dL (ref 8–23)
CO2: 26 mmol/L (ref 22–32)
Calcium: 8.9 mg/dL (ref 8.9–10.3)
Chloride: 105 mmol/L (ref 98–111)
Creatinine: 1.2 mg/dL — ABNORMAL HIGH (ref 0.44–1.00)
GFR, Est AFR Am: 54 mL/min — ABNORMAL LOW (ref >60)
GFR, Estimated: 47 mL/min — ABNORMAL LOW (ref >60)
Glucose, Bld: 97 mg/dL (ref 70–99)
Potassium: 4.1 mmol/L (ref 3.5–5.1)
Sodium: 142 mmol/L (ref 135–145)
Total Bilirubin: 0.4 mg/dL (ref 0.3–1.2)
Total Protein: 7.2 g/dL (ref 6.5–8.1)

## 2019-09-10 LAB — CBC WITH DIFFERENTIAL (CANCER CENTER ONLY)
Abs Immature Granulocytes: 0.04 10*3/uL (ref 0.00–0.07)
Basophils Absolute: 0 10*3/uL (ref 0.0–0.1)
Basophils Relative: 0 %
Eosinophils Absolute: 0.1 10*3/uL (ref 0.0–0.5)
Eosinophils Relative: 1 %
HCT: 33.4 % — ABNORMAL LOW (ref 36.0–46.0)
Hemoglobin: 11.3 g/dL — ABNORMAL LOW (ref 12.0–15.0)
Immature Granulocytes: 0 %
Lymphocytes Relative: 17 %
Lymphs Abs: 1.6 10*3/uL (ref 0.7–4.0)
MCH: 27.8 pg (ref 26.0–34.0)
MCHC: 33.8 g/dL (ref 30.0–36.0)
MCV: 82.3 fL (ref 80.0–100.0)
Monocytes Absolute: 0.6 10*3/uL (ref 0.1–1.0)
Monocytes Relative: 6 %
Neutro Abs: 7.2 10*3/uL (ref 1.7–7.7)
Neutrophils Relative %: 76 %
Platelet Count: 249 10*3/uL (ref 150–400)
RBC: 4.06 MIL/uL (ref 3.87–5.11)
RDW: 15 % (ref 11.5–15.5)
WBC Count: 9.5 10*3/uL (ref 4.0–10.5)
nRBC: 0 % (ref 0.0–0.2)

## 2019-09-10 LAB — PROTIME-INR
INR: 2.9 — ABNORMAL HIGH (ref 0.8–1.2)
Prothrombin Time: 30.6 seconds — ABNORMAL HIGH (ref 11.4–15.2)

## 2019-09-10 MED ORDER — AMLODIPINE BESYLATE 10 MG PO TABS: 10.0000 mg | ORAL_TABLET | Freq: Every morning | ORAL | 4 refills | Status: DC

## 2019-09-10 MED ORDER — DIPHENHYDRAMINE HCL 25 MG PO CAPS
ORAL_CAPSULE | ORAL | Status: AC
  Filled 2019-09-10: qty 1

## 2019-09-10 MED ORDER — ACETAMINOPHEN 325 MG PO TABS
ORAL_TABLET | ORAL | Status: AC
  Filled 2019-09-10: qty 2

## 2019-09-10 MED ORDER — ACETAMINOPHEN 325 MG PO TABS
650.0000 mg | ORAL_TABLET | Freq: Once | ORAL | Status: AC
  Administered 2019-09-10: 650 mg via ORAL

## 2019-09-10 MED ORDER — SODIUM CHLORIDE 0.9% FLUSH
10.0000 mL | INTRAVENOUS | Status: DC | PRN
  Administered 2019-09-10: 10 mL
  Filled 2019-09-10: qty 10

## 2019-09-10 MED ORDER — WARFARIN SODIUM 5 MG PO TABS: ORAL_TABLET | ORAL | 4 refills | Status: DC

## 2019-09-10 MED ORDER — LORAZEPAM 2 MG/ML IJ SOLN
INTRAMUSCULAR | Status: AC
  Filled 2019-09-10: qty 1

## 2019-09-10 MED ORDER — TRASTUZUMAB-DKST CHEMO 150 MG IV SOLR
750.0000 mg | Freq: Once | INTRAVENOUS | Status: AC
  Administered 2019-09-10: 750 mg via INTRAVENOUS
  Filled 2019-09-10: qty 35.72

## 2019-09-10 MED ORDER — TEMAZEPAM 30 MG PO CAPS: ORAL_CAPSULE | ORAL | 2 refills | Status: DC

## 2019-09-10 MED ORDER — DIPHENHYDRAMINE HCL 25 MG PO CAPS
25.0000 mg | ORAL_CAPSULE | Freq: Once | ORAL | Status: AC
  Administered 2019-09-10: 25 mg via ORAL

## 2019-09-10 MED ORDER — HEPARIN SOD (PORK) LOCK FLUSH 100 UNIT/ML IV SOLN
500.0000 [IU] | Freq: Once | INTRAVENOUS | Status: AC | PRN
  Administered 2019-09-10: 500 [IU]
  Filled 2019-09-10: qty 5

## 2019-09-10 MED ORDER — SODIUM CHLORIDE 0.9 % IV SOLN
Freq: Once | INTRAVENOUS | Status: AC
  Filled 2019-09-10: qty 250

## 2019-09-10 MED ORDER — LORAZEPAM 2 MG/ML IJ SOLN
1.0000 mg | Freq: Once | INTRAMUSCULAR | Status: AC | PRN
  Administered 2019-09-10: 1 mg via INTRAVENOUS

## 2019-09-10 NOTE — Progress Notes (Signed)
Per Dr. Jana Hakim, ok to treat with Echo from 03/02/2019. Dr. Jana Hakim to put in orders for Echo to be completed sometime after this visit.

## 2019-09-10 NOTE — Patient Instructions (Signed)
[facility] Discharge Instructions for Patients Receiving Chemotherapy  Today you received the following chemotherapy agents trastuzumab.  To help prevent nausea and vomiting after your treatment, we encourage you to take your nausea medication as directed.    If you develop nausea and vomiting that is not controlled by your nausea medication, call the clinic.   BELOW ARE SYMPTOMS THAT SHOULD BE REPORTED IMMEDIATELY:  *FEVER GREATER THAN 100.5 F  *CHILLS WITH OR WITHOUT FEVER  NAUSEA AND VOMITING THAT IS NOT CONTROLLED WITH YOUR NAUSEA MEDICATION  *UNUSUAL SHORTNESS OF BREATH  *UNUSUAL BRUISING OR BLEEDING  TENDERNESS IN MOUTH AND THROAT WITH OR WITHOUT PRESENCE OF ULCERS  *URINARY PROBLEMS  *BOWEL PROBLEMS  UNUSUAL RASH Items with * indicate a potential emergency and should be followed up as soon as possible.  Feel free to call the clinic should you have any questions or concerns. The clinic phone number is [phone].  Please show the CHEMO ALERT CARD at check-in to the Emergency Department and triage nurse.   

## 2019-09-12 ENCOUNTER — Other Ambulatory Visit: Payer: Self-pay | Admitting: Oncology

## 2019-10-08 ENCOUNTER — Inpatient Hospital Stay: Payer: Medicare Other

## 2019-10-08 ENCOUNTER — Inpatient Hospital Stay: Payer: Medicare Other | Attending: Oncology

## 2019-10-08 ENCOUNTER — Other Ambulatory Visit: Payer: Self-pay

## 2019-10-08 VITALS — BP 152/68 | HR 68 | Temp 97.9°F | Resp 18

## 2019-10-08 DIAGNOSIS — C50812 Malignant neoplasm of overlapping sites of left female breast: Secondary | ICD-10-CM | POA: Diagnosis not present

## 2019-10-08 DIAGNOSIS — C50911 Malignant neoplasm of unspecified site of right female breast: Secondary | ICD-10-CM | POA: Diagnosis not present

## 2019-10-08 DIAGNOSIS — C50919 Malignant neoplasm of unspecified site of unspecified female breast: Secondary | ICD-10-CM

## 2019-10-08 DIAGNOSIS — Z5112 Encounter for antineoplastic immunotherapy: Secondary | ICD-10-CM | POA: Diagnosis not present

## 2019-10-08 DIAGNOSIS — Z7901 Long term (current) use of anticoagulants: Secondary | ICD-10-CM | POA: Diagnosis not present

## 2019-10-08 LAB — CBC WITH DIFFERENTIAL (CANCER CENTER ONLY)
Abs Immature Granulocytes: 0.01 10*3/uL (ref 0.00–0.07)
Basophils Absolute: 0.1 10*3/uL (ref 0.0–0.1)
Basophils Relative: 1 %
Eosinophils Absolute: 0.1 10*3/uL (ref 0.0–0.5)
Eosinophils Relative: 1 %
HCT: 35.8 % — ABNORMAL LOW (ref 36.0–46.0)
Hemoglobin: 12 g/dL (ref 12.0–15.0)
Immature Granulocytes: 0 %
Lymphocytes Relative: 20 %
Lymphs Abs: 1.6 10*3/uL (ref 0.7–4.0)
MCH: 28 pg (ref 26.0–34.0)
MCHC: 33.5 g/dL (ref 30.0–36.0)
MCV: 83.4 fL (ref 80.0–100.0)
Monocytes Absolute: 0.7 10*3/uL (ref 0.1–1.0)
Monocytes Relative: 8 %
Neutro Abs: 5.7 10*3/uL (ref 1.7–7.7)
Neutrophils Relative %: 70 %
Platelet Count: 262 10*3/uL (ref 150–400)
RBC: 4.29 MIL/uL (ref 3.87–5.11)
RDW: 15.8 % — ABNORMAL HIGH (ref 11.5–15.5)
WBC Count: 8.2 10*3/uL (ref 4.0–10.5)
nRBC: 0 % (ref 0.0–0.2)

## 2019-10-08 LAB — CMP (CANCER CENTER ONLY)
ALT: 11 U/L (ref 0–44)
AST: 9 U/L — ABNORMAL LOW (ref 15–41)
Albumin: 3.6 g/dL (ref 3.5–5.0)
Alkaline Phosphatase: 72 U/L (ref 38–126)
Anion gap: 10 (ref 5–15)
BUN: 18 mg/dL (ref 8–23)
CO2: 27 mmol/L (ref 22–32)
Calcium: 9.4 mg/dL (ref 8.9–10.3)
Chloride: 109 mmol/L (ref 98–111)
Creatinine: 0.86 mg/dL (ref 0.44–1.00)
GFR, Est AFR Am: >60 mL/min (ref >60)
GFR, Estimated: >60 mL/min (ref >60)
Glucose, Bld: 111 mg/dL — ABNORMAL HIGH (ref 70–99)
Potassium: 4.4 mmol/L (ref 3.5–5.1)
Sodium: 146 mmol/L — ABNORMAL HIGH (ref 135–145)
Total Bilirubin: 0.6 mg/dL (ref 0.3–1.2)
Total Protein: 7.5 g/dL (ref 6.5–8.1)

## 2019-10-08 LAB — PROTIME-INR
INR: 1.5 — ABNORMAL HIGH (ref 0.8–1.2)
Prothrombin Time: 17.9 seconds — ABNORMAL HIGH (ref 11.4–15.2)

## 2019-10-08 MED ORDER — ACETAMINOPHEN 325 MG PO TABS
ORAL_TABLET | ORAL | Status: AC
  Filled 2019-10-08: qty 2

## 2019-10-08 MED ORDER — ACETAMINOPHEN 325 MG PO TABS
650.0000 mg | ORAL_TABLET | Freq: Once | ORAL | Status: AC
  Administered 2019-10-08: 650 mg via ORAL

## 2019-10-08 MED ORDER — DIPHENHYDRAMINE HCL 25 MG PO CAPS
ORAL_CAPSULE | ORAL | Status: AC
  Filled 2019-10-08: qty 1

## 2019-10-08 MED ORDER — DIPHENHYDRAMINE HCL 25 MG PO CAPS
25.0000 mg | ORAL_CAPSULE | Freq: Once | ORAL | Status: AC
  Administered 2019-10-08: 25 mg via ORAL

## 2019-10-08 MED ORDER — TRASTUZUMAB-DKST CHEMO 150 MG IV SOLR
750.0000 mg | Freq: Once | INTRAVENOUS | Status: AC
  Administered 2019-10-08: 750 mg via INTRAVENOUS
  Filled 2019-10-08: qty 35.72

## 2019-10-08 MED ORDER — SODIUM CHLORIDE 0.9 % IV SOLN
Freq: Once | INTRAVENOUS | Status: AC
  Filled 2019-10-08: qty 250

## 2019-10-08 MED ORDER — LORAZEPAM 2 MG/ML IJ SOLN
INTRAMUSCULAR | Status: AC
  Filled 2019-10-08: qty 1

## 2019-10-08 MED ORDER — HEPARIN SOD (PORK) LOCK FLUSH 100 UNIT/ML IV SOLN
500.0000 [IU] | Freq: Once | INTRAVENOUS | Status: AC | PRN
  Administered 2019-10-08: 500 [IU]
  Filled 2019-10-08: qty 5

## 2019-10-08 MED ORDER — SODIUM CHLORIDE 0.9% FLUSH
10.0000 mL | INTRAVENOUS | Status: DC | PRN
  Administered 2019-10-08: 10:00:00 10 mL
  Filled 2019-10-08: qty 10

## 2019-10-08 MED ORDER — LORAZEPAM 2 MG/ML IJ SOLN
1.0000 mg | Freq: Once | INTRAMUSCULAR | Status: AC | PRN
  Administered 2019-10-08: 1 mg via INTRAVENOUS

## 2019-10-08 NOTE — Patient Instructions (Signed)
[facility] Discharge Instructions for Patients Receiving Chemotherapy  Today you received the following chemotherapy agents trastuzumab.  To help prevent nausea and vomiting after your treatment, we encourage you to take your nausea medication as directed.    If you develop nausea and vomiting that is not controlled by your nausea medication, call the clinic.   BELOW ARE SYMPTOMS THAT SHOULD BE REPORTED IMMEDIATELY:  *FEVER GREATER THAN 100.5 F  *CHILLS WITH OR WITHOUT FEVER  NAUSEA AND VOMITING THAT IS NOT CONTROLLED WITH YOUR NAUSEA MEDICATION  *UNUSUAL SHORTNESS OF BREATH  *UNUSUAL BRUISING OR BLEEDING  TENDERNESS IN MOUTH AND THROAT WITH OR WITHOUT PRESENCE OF ULCERS  *URINARY PROBLEMS  *BOWEL PROBLEMS  UNUSUAL RASH Items with * indicate a potential emergency and should be followed up as soon as possible.  Feel free to call the clinic should you have any questions or concerns. The clinic phone number is [phone].  Please show the CHEMO ALERT CARD at check-in to the Emergency Department and triage nurse.   

## 2019-10-19 ENCOUNTER — Other Ambulatory Visit: Payer: Self-pay

## 2019-10-19 MED ORDER — ALPRAZOLAM 1 MG PO TABS: 1.0000 mg | ORAL_TABLET | Freq: Three times a day (TID) | ORAL | 0 refills | Status: DC | PRN

## 2019-10-25 ENCOUNTER — Other Ambulatory Visit: Payer: Self-pay | Admitting: Oncology

## 2019-11-04 NOTE — Progress Notes (Signed)
[place]  [phone] [phone]    ID: [name]   DOB: 16-Mar-1951  MR#: [id number]  BTD#:[id number]   Patient Care Team: Ernestene Kiel, MD as PCP - General (Internal Medicine) Haze Rushing., MD as Consulting Physician (Pain Medicine) Joya Salm, MD as Referring Physician (Orthopedic Surgery) Jaymes Graff, DO as Consulting Physician (Orthopedic Surgery) Larey Dresser, MD as Consulting Physician (Cardiology) Bensimhon, Shaune Pascal, MD as Consulting Physician (Cardiology) Lorelle Gibbs, MD (Radiology) Tommy Medal, Lavell Islam, MD as Consulting Physician (Infectious Diseases) Gery Pray, MD as Consulting Physician (Radiation Oncology) [place] Mokry, Virgie Dad, MD as Consulting Physician (Oncology)   CHIEF COMPLAINT: Metastatic HER-2 positive, estrogen receptor negative breast cancer (s/p bilateral mastectomies)  CURRENT TREATMENT: Trastuzumab every 28 days; lifelong warfarin   INTERVAL HISTORY: Prentiss returns today for follow-up and treatment of her estrogen receptor negative, but HER2 amplified breast cancer.   She continues on trastuzumab, which she receives every 28 days.  She has absolutely no side effects related to this that she is aware of. Her most recent echocardiogram on 03/02/2019 showed an ejection fraction of 60-65%.  She is on lifelong warfarin. She tolerates this well and without any noticeable side effects. Her PT:INR today is pending. Lab Results  Component Value Date   INR 1.3 (H) 11/05/2019   INR 1.5 (H) 10/08/2019   INR 2.9 (H) 09/10/2019   INR 1.8 (H) 08/13/2019   INR 2.8 (H) 07/16/2019   Her most recent PET scan was 12/26/2018 and showed no evidence of active disease   REVIEW OF SYSTEMS: Katessa has had her first but during our vaccine which she tolerated well and will have her second dose on 11/23/2019.  She is taking a course in concealed carry and tells me her neighborhood really is not safe.   Her husband was making progress but then stalled and she is concerned that he is not sufficiently motivated.  They do have a [employer] membership.  Her daughter is still living with her at home, together with 2 granddaughters, 1 of whom, 50, will be going to [employer] in the fall.  Jezebelle is expecting another grandchild and I will be I believe her 16th.  She also has 4 great-grandchildren.   BREAST CANCER HISTORY:   From the earlier summary:  [name] is 69 years old [name] female.  This woman has been in good health all of her life.  She noted a swelling and discomfort in her right breast in June 2004.  She was seen in the Emergency Room in [place] and was treated for mastitis.  She was treated for a number of months with mastitis and the swelling did not get better. She was given hydrocodone and Cipro.  Finally, the swelling did get better and ultimately the nipple became retracted and she noticed some dimpling in her skin. She had a mammogram in July of 2004 in [place] with subsequent mammogram on May 27, 2003, by Dr. Isaiah Blakes.  Mammogram done on September 30 showed marked increased density in the left breast.  Biopsy was performed the same day.  It was noted at the 12 o'clock position, deep in the breast was a focal hypoechoic mass, at least 3.5 cm in diameter.  Biopsy did in fact show invasive in situ mammary carcinoma. This was felt to be both at least intermediate, high grade.  No definite lymphovascular invasion was identified. ER and PR negative, Her2 testing positive. [name] continues to have pain in  her breast.  She continues to take hydrocodone a number of times a day.  She has been seen by Dr. Rosana Hoes, who felt that neoadjuvant chemotherapy would be required.    Initial staging studies showed evidence of liver and lung mets.   Patient also has evidence of bone lesions. Patient started neoadjuvant chemotherapy, Taxotere/Carbo/Herceptin in October 2004.   Patient had a CT scan in  December 2004 which demonstrated extensive clot in the SVC innominate vein, bilateral jugular vein and  She was started on anticoagulation therapy. She received a total of 6 cycles of Taxotere/Carbo/Herceptin, completed in April 2005."  Patient has been on  trastuzumab continued indefinitely; has also received lapatinib and capecitabine for variable intervals in 2007-2008. Most recent echo 12/01/2013 showed an ejection fraction of 55%. She is status post bilateral mastectomies with bilateral axillary lymph node dissection 12/07/2004, showing (a) on the right, a mypT1c ypN1 invasive ductal carcinoma, grade 3, estrogen and progesterone receptor negative, HER-2 positive, with an MIB-1 of 31% (b) on the left, ypT2 ypN1 invasive ductal carcinoma, grade 2, estrogen and progesterone receptor negative, HER-2 positive, with an MIB-1 of 35%. She is Status post radiation June through July of 2006, to the right chest wall, left chest wall, bilateral supraclavicular fossae, and bilateral axillary boosts; with additional radiation to the right and left chest walls and the central chest wall completed November of 2007. She is status post ixempra x9 completed August of 2009. 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 Patient has chronic pain, with negative PET scan 08/24/2013 (no evidence of active cancer). On Neurontin and Tramadol therapy.  Her subsequent history is as detailed below   PAST MEDICAL HISTORY: Past Medical History:  Diagnosis Date  . Breast cancer ([place])    mets to liver and lung  . Breast cancer metastasized to multiple sites ([place]) 02/26/2013  . History of chemotherapy Feb. 2006   taxotere/herceptin/carboplatin  . Hypertension   . Neuropathy   . Radiation 07/31/2006   left upper chest  . Radiation 06/17/2006-06/27/2006   6480 cGy bilat. chest wall  . SVC syndrome   . Thrombosis     PAST SURGICAL HISTORY: Past  Surgical History:  Procedure Laterality Date  . ANKLE SURGERY    . BACK SURGERY    . CHOLECYSTECTOMY  1989  . MASTECTOMY Bilateral   . PERIPHERALLY INSERTED CENTRAL CATHETER INSERTION    . TUBAL LIGATION  1986    FAMILY HISTORY Family History  Problem Relation Age of Onset  . Heart failure Father   . Cancer Father        Prostate cancer  . Heart failure Brother   . Cancer Brother        Prostate cancer  . Diabetes Maternal Aunt   She had three brothers, one died of gunshot wound, one of complications of diabetes mellitus and one of myocardial infarction.  She has no sisters.  Mother died of complications of brain metastasis in 38.  Father has had a myocardial infarction in 1999.  No history of breast or ovarian cancer in the family.     GYNECOLOGIC HISTORY:    No LMP recorded. Patient is postmenopausal. Menarche at age 60.   Gravida 3, para 3.   First live birth at age 44.   No history of breast feeding. No history of hormonal replacement therapy.    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 He used to work as a Art gallery manager, but is now retired and disabled. The patient has three children, Monette who lives in [place] and works as a Hydrographic surveyor, Financial risk analyst who lives in [place] and works as a Administrator, and Crystal who lives in [place] and also works as a Hydrographic surveyor. The patient has 14 grandchildren and 4 great-grandchildren. She attends a [employer].   ADVANCED DIRECTIVES:  Not in place   HEALTH MAINTENANCE: (Updated 06/12/2013) Social History   Tobacco Use  . Smoking status: Never Smoker  . Smokeless tobacco: Never Used  Substance Use Topics  . Alcohol use: Yes    Comment: occasional  . Drug use: No     Colonoscopy: Never and "I don't want one"  PAP:  1987  Bone density:  Never   Lipid panel:   Not on file   Allergies  Allergen Reactions  . Penicillins Hives    PATIENT HAS TOLERATED CEPHALOSPORINGS  . Adhesive [Tape] Other (See Comments)    Tears skin     Current Outpatient Medications  Medication Sig Dispense Refill  . baclofen (LIORESAL) 10 MG tablet TAKE 1 TABLET BY MOUTH THREE TIMES DAILY AS NEEDED FOR MUSCLE SPASMS 270 tablet 0  . acetaminophen (TYLENOL) 500 MG tablet Take 1,000 mg by mouth every 6 (six) hours as needed for mild pain or fever.     Marland Kitchen albuterol (PROVENTIL HFA;VENTOLIN HFA) 108 (90 BASE) MCG/ACT inhaler Inhale 2 puffs into the lungs every 6 (six) hours as needed for wheezing. 1 Inhaler 5  . ALPRAZolam (XANAX) 1 MG tablet Take 1 tablet (1 mg total) by mouth 3 (three) times daily as needed. for anxiety 60 tablet 0  . amLODipine (NORVASC) 10 MG tablet Take 1 tablet (10 mg total) by mouth every morning. 30 tablet 4  . carvedilol (COREG) 12.5 MG tablet Take 1 tablet (12.5 mg total) by mouth 2 (two) times daily. 60 tablet 6  . diclofenac sodium (VOLTAREN) 1 % GEL Apply 2 g topically daily as needed (for pain). Apply to knees and shoulders 100 g 6  . furosemide (LASIX) 40 MG tablet TAKE 1 TABLET(40 MG) BY MOUTH DAILY 30 tablet 3  . gabapentin (NEURONTIN) 300 MG capsule TAKE 2 CAPSULES(600 MG) BY MOUTH THREE TIMES DAILY 540 capsule 0  . losartan (COZAAR) 100 MG tablet Take 100 mg by mouth every morning.    . mupirocin ointment (BACTROBAN) 2 % APP EXT TO SORE ON FOOT TID FOR 5 DAYS    . oxyCODONE-acetaminophen (PERCOCET) 10-325 MG tablet Take 1 tablet by mouth 4 (four) times daily as needed.    . potassium chloride (K-DUR,KLOR-CON) 20 MEQ tablet Take 1 tablet (20 mEq total) by mouth daily. 30 tablet 3  . predniSONE (DELTASONE) 5 MG tablet     . spironolactone (ALDACTONE) 25 MG tablet TAKE 1 TABLET(25 MG) BY MOUTH DAILY 30 tablet 5  . temazepam (RESTORIL) 30 MG capsule TAKE ONE CAPSULE BY MOUTH EVERY NIGHT AT BEDTIME AS NEEDED FOR SLEEP 30 capsule 2  . warfarin  (COUMADIN) 5 MG tablet Take one tablet on odd numbered days and one and a half tablets on even numbered days 90 tablet 4   No current facility-administered medications for this visit.   Facility-Administered Medications Ordered in Other Visits  Medication Dose Route Frequency Provider Last Rate Last Admin  . sodium chloride flush (NS)  0.9 % injection 10 mL  10 mL Intravenous PRN Damean Poffenberger, Virgie Dad, MD   10 mL at 12/15/15 1200  . sodium chloride flush (NS) 0.9 % injection 10 mL  10 mL Intracatheter PRN Elie Gragert, Virgie Dad, MD   10 mL at 08/14/18 1116  . sodium chloride flush (NS) 0.9 % injection 10 mL  10 mL Intracatheter PRN Aunesty Tyson, Virgie Dad, MD        OBJECTIVE: African-American woman who appears stated age  Vitals:   11/05/19 1038  BP: (!) 145/90  Pulse: 63  Resp: 18  Temp: 98.7 F (37.1 C)  SpO2: 100%     Body mass index is 48.87 kg/m.    ECOG FS: 1 Filed Weights   11/05/19 1038  Weight: 275 lb 14.4 oz (125.1 kg)    Sclerae unicteric, EOMs intact Wearing a mask No cervical or supraclavicular adenopathy Lungs no rales or rhonchi Heart regular rate and rhythm Abd soft, nontender, positive bowel sounds MSK no focal spinal tenderness, walks with a cane Neuro: nonfocal, well oriented, appropriate affect Breasts: Status post bilateral mastectomies.  There is no evidence of chest wall recurrence.  Both axillae are benign.   LAB RESULTS: Lab Results  Component Value Date   WBC 9.2 11/05/2019   NEUTROABS 6.9 11/05/2019   HGB 12.1 11/05/2019   HCT 35.3 (L) 11/05/2019   MCV 83.8 11/05/2019   PLT 227 11/05/2019      Chemistry      Component Value Date/Time   NA 144 11/05/2019 1007   NA 143 08/14/2017 0811   K 4.2 11/05/2019 1007   K 3.8 08/14/2017 0811   CL 107 11/05/2019 1007   CL 104 01/30/2013 0850   CO2 27 11/05/2019 1007   CO2 24 08/14/2017 0811   BUN 18 11/05/2019 1007   BUN 14.9 08/14/2017 0811   CREATININE 0.86 11/05/2019 1007   CREATININE 0.9  08/14/2017 0811      Component Value Date/Time   CALCIUM 9.2 11/05/2019 1007   CALCIUM 9.4 08/14/2017 0811   ALKPHOS 75 11/05/2019 1007   ALKPHOS 73 08/14/2017 0811   AST 11 (L) 11/05/2019 1007   AST 10 08/14/2017 0811   ALT 11 11/05/2019 1007   ALT 10 08/14/2017 0811   BILITOT 0.8 11/05/2019 1007   BILITOT 0.62 08/14/2017 0811      STUDIES: No results found.   ASSESSMENT: 69 y.o.  [place], [place], woman  (1)  with a history of inflammatory right breast cancer metastatic at presentation September 2004 with involvement of liver and bone, HER-2 positive, estrogen and progesterone receptor negative  (2) treated with carboplatin, docetaxel and Herceptin x 6 completed April 2005  (3) trastuzumab continued indefinitely;   (a) has also received lapatinib and capecitabine for variable intervals in 2007-2008.  (b) Every 6 month echo: 09/24/2017 showed an ejection fraction in the 55-60%  (c) echocardiogram 03/07/2018 showed an ejection fraction in the 55-60%  (d) cardiogram 09/30/2018 showed ejection fraction in the 60-65% range.  (4) status post bilateral mastectomies with bilateral axillary lymph node dissection 12/07/2004, showing  (a) on the right, a mypT1c ypN1 invasive ductal carcinoma, grade 3, estrogen and progesterone receptor negative, HER-2 positive, with an MIB-1 of 31%  (b) on the left, ypT2 ypN1 invasive ductal carcinoma, grade 2, estrogen and progesterone receptor negative, HER-2 positive, with an MIB-1 of 35%.  (5)  Status post radiation June through July of 2006, to the right chest wall, left chest wall, bilateral supraclavicular fossae,  and bilateral axillary boosts; with additional radiation to the right and left chest walls and the central chest wall completed November of 2007  (6) status post Ixempra x9 completed August of 2009.  (7) history of superior vena caval syndrome, on life long anticoagulation   (8)  History of chemotherapy-induced neuropathy.    (9)  chronic pain, with negative PET scan 08/24/2013 (no evidence of active cancer). On Neurontin and Tramadol  (a) repeat PET scan December 2016 again negative.  (b) Repeat PET scan 04/23/2017 shows no active malignancy  (c) PET scan 07/09/2018 shows no evidence of active malignancy.  (10) right upper extremity cellulitis, no bacteremia; treated with cephalexin /doxycycline for 2 weeks, with resolution  (11) subacute fracture right humerus, nondisplaced, status post fall 03/27/2017   PLAN: Lonia is just about 15 years out from definitive surgery for her breast cancer and 17 years out from initial presentation with very well-controlled disease.  She is tolerating the trastuzumab without any side effects that she is aware of and she is already scheduled for repeat echocardiogram 11/30/2019.  We reviewed her situation at home and I am hopeful her husband will be agreeable [facility] a [facility] basis and increase his functional status through exercise.  Barring any other eventualities she will return to see me in 3 months.  She knows to call for any other issue that may develop before then.  Total encounter time 25 minutes.*   Fayetta Sorenson, Virgie Dad, MD  11/05/19 11:00 AM Medical Oncology and Hematology [facility] [place], [place] [address] Tel. [phone]    Fax. [phone]   I, Wilburn Mylar, am acting as scribe for Dr. Virgie Dad. Pinchas Reither.  I, Lurline Del MD, have reviewed the above documentation for accuracy and completeness, and I agree with the above.   *Total Encounter Time as defined by the Centers for Medicare and Medicaid Services includes, in addition to the face-to-face time of a patient visit (documented in the note above) non-face-to-face time: obtaining and reviewing outside history, ordering and reviewing medications, tests or procedures, care coordination (communications with other health care professionals or  caregivers) and documentation in the medical record.

## 2019-11-05 ENCOUNTER — Inpatient Hospital Stay (HOSPITAL_BASED_OUTPATIENT_CLINIC_OR_DEPARTMENT_OTHER): Payer: Medicare Other | Admitting: Oncology

## 2019-11-05 ENCOUNTER — Inpatient Hospital Stay: Payer: Medicare Other | Attending: Oncology

## 2019-11-05 ENCOUNTER — Inpatient Hospital Stay: Payer: Medicare Other

## 2019-11-05 ENCOUNTER — Other Ambulatory Visit: Payer: Self-pay

## 2019-11-05 ENCOUNTER — Telehealth: Payer: Self-pay | Admitting: Oncology

## 2019-11-05 VITALS — BP 145/90 | HR 63 | Temp 98.7°F | Resp 18 | Ht 63.0 in | Wt 275.9 lb

## 2019-11-05 DIAGNOSIS — C50911 Malignant neoplasm of unspecified site of right female breast: Secondary | ICD-10-CM | POA: Insufficient documentation

## 2019-11-05 DIAGNOSIS — C50919 Malignant neoplasm of unspecified site of unspecified female breast: Secondary | ICD-10-CM

## 2019-11-05 DIAGNOSIS — I10 Essential (primary) hypertension: Secondary | ICD-10-CM | POA: Diagnosis not present

## 2019-11-05 DIAGNOSIS — C787 Secondary malignant neoplasm of liver and intrahepatic bile duct: Secondary | ICD-10-CM | POA: Insufficient documentation

## 2019-11-05 DIAGNOSIS — Z171 Estrogen receptor negative status [ER-]: Secondary | ICD-10-CM | POA: Insufficient documentation

## 2019-11-05 DIAGNOSIS — Z7901 Long term (current) use of anticoagulants: Secondary | ICD-10-CM | POA: Diagnosis not present

## 2019-11-05 DIAGNOSIS — I871 Compression of vein: Secondary | ICD-10-CM | POA: Insufficient documentation

## 2019-11-05 DIAGNOSIS — C50912 Malignant neoplasm of unspecified site of left female breast: Secondary | ICD-10-CM | POA: Diagnosis not present

## 2019-11-05 DIAGNOSIS — Z7189 Other specified counseling: Secondary | ICD-10-CM

## 2019-11-05 DIAGNOSIS — Z5112 Encounter for antineoplastic immunotherapy: Secondary | ICD-10-CM | POA: Insufficient documentation

## 2019-11-05 DIAGNOSIS — D689 Coagulation defect, unspecified: Secondary | ICD-10-CM

## 2019-11-05 DIAGNOSIS — C7951 Secondary malignant neoplasm of bone: Secondary | ICD-10-CM | POA: Diagnosis not present

## 2019-11-05 DIAGNOSIS — C50812 Malignant neoplasm of overlapping sites of left female breast: Secondary | ICD-10-CM | POA: Insufficient documentation

## 2019-11-05 LAB — CMP (CANCER CENTER ONLY)
ALT: 11 U/L (ref 0–44)
AST: 11 U/L — ABNORMAL LOW (ref 15–41)
Albumin: 3.6 g/dL (ref 3.5–5.0)
Alkaline Phosphatase: 75 U/L (ref 38–126)
Anion gap: 10 (ref 5–15)
BUN: 18 mg/dL (ref 8–23)
CO2: 27 mmol/L (ref 22–32)
Calcium: 9.2 mg/dL (ref 8.9–10.3)
Chloride: 107 mmol/L (ref 98–111)
Creatinine: 0.86 mg/dL (ref 0.44–1.00)
GFR, Est AFR Am: >60 mL/min (ref >60)
GFR, Estimated: >60 mL/min (ref >60)
Glucose, Bld: 103 mg/dL — ABNORMAL HIGH (ref 70–99)
Potassium: 4.2 mmol/L (ref 3.5–5.1)
Sodium: 144 mmol/L (ref 135–145)
Total Bilirubin: 0.8 mg/dL (ref 0.3–1.2)
Total Protein: 7.4 g/dL (ref 6.5–8.1)

## 2019-11-05 LAB — CBC WITH DIFFERENTIAL (CANCER CENTER ONLY)
Abs Immature Granulocytes: 0.04 10*3/uL (ref 0.00–0.07)
Basophils Absolute: 0.1 10*3/uL (ref 0.0–0.1)
Basophils Relative: 1 %
Eosinophils Absolute: 0.1 10*3/uL (ref 0.0–0.5)
Eosinophils Relative: 1 %
HCT: 35.3 % — ABNORMAL LOW (ref 36.0–46.0)
Hemoglobin: 12.1 g/dL (ref 12.0–15.0)
Immature Granulocytes: 0 %
Lymphocytes Relative: 17 %
Lymphs Abs: 1.6 10*3/uL (ref 0.7–4.0)
MCH: 28.7 pg (ref 26.0–34.0)
MCHC: 34.3 g/dL (ref 30.0–36.0)
MCV: 83.8 fL (ref 80.0–100.0)
Monocytes Absolute: 0.6 10*3/uL (ref 0.1–1.0)
Monocytes Relative: 6 %
Neutro Abs: 6.9 10*3/uL (ref 1.7–7.7)
Neutrophils Relative %: 75 %
Platelet Count: 227 10*3/uL (ref 150–400)
RBC: 4.21 MIL/uL (ref 3.87–5.11)
RDW: 15.8 % — ABNORMAL HIGH (ref 11.5–15.5)
WBC Count: 9.2 10*3/uL (ref 4.0–10.5)
nRBC: 0 % (ref 0.0–0.2)

## 2019-11-05 LAB — PROTIME-INR
INR: 1.3 — ABNORMAL HIGH (ref 0.8–1.2)
Prothrombin Time: 16.5 seconds — ABNORMAL HIGH (ref 11.4–15.2)

## 2019-11-05 MED ORDER — SODIUM CHLORIDE 0.9% FLUSH
10.0000 mL | INTRAVENOUS | Status: DC | PRN
  Administered 2019-11-05: 10 mL
  Filled 2019-11-05: qty 10

## 2019-11-05 MED ORDER — LORAZEPAM 2 MG/ML IJ SOLN
INTRAMUSCULAR | Status: AC
  Filled 2019-11-05: qty 1

## 2019-11-05 MED ORDER — LORAZEPAM 2 MG/ML IJ SOLN
1.0000 mg | Freq: Once | INTRAMUSCULAR | Status: AC | PRN
  Administered 2019-11-05: 1 mg via INTRAVENOUS

## 2019-11-05 MED ORDER — ACETAMINOPHEN 325 MG PO TABS
ORAL_TABLET | ORAL | Status: AC
  Filled 2019-11-05: qty 2

## 2019-11-05 MED ORDER — DIPHENHYDRAMINE HCL 25 MG PO CAPS
25.0000 mg | ORAL_CAPSULE | Freq: Once | ORAL | Status: AC
  Administered 2019-11-05: 25 mg via ORAL

## 2019-11-05 MED ORDER — ACETAMINOPHEN 325 MG PO TABS
650.0000 mg | ORAL_TABLET | Freq: Once | ORAL | Status: AC
  Administered 2019-11-05: 650 mg via ORAL

## 2019-11-05 MED ORDER — HEPARIN SOD (PORK) LOCK FLUSH 100 UNIT/ML IV SOLN
500.0000 [IU] | Freq: Once | INTRAVENOUS | Status: AC | PRN
  Administered 2019-11-05: 500 [IU]
  Filled 2019-11-05: qty 5

## 2019-11-05 MED ORDER — DIPHENHYDRAMINE HCL 25 MG PO CAPS
ORAL_CAPSULE | ORAL | Status: AC
  Filled 2019-11-05: qty 1

## 2019-11-05 MED ORDER — TRASTUZUMAB-DKST CHEMO 150 MG IV SOLR
750.0000 mg | Freq: Once | INTRAVENOUS | Status: AC
  Administered 2019-11-05: 750 mg via INTRAVENOUS
  Filled 2019-11-05: qty 35.72

## 2019-11-05 MED ORDER — SODIUM CHLORIDE 0.9 % IV SOLN
Freq: Once | INTRAVENOUS | Status: AC
  Filled 2019-11-05: qty 250

## 2019-11-05 NOTE — Telephone Encounter (Signed)
Scheduled appts per 3/11 los. Pt is aware of new appt date and time.

## 2019-11-05 NOTE — Patient Instructions (Signed)
[facility] °Discharge Instructions for Patients Receiving Chemotherapy ° °Today you received the following chemotherapy agents Trastuzumab ° °To help prevent nausea and vomiting after your treatment, we encourage you to take your nausea medication as directed. °  °If you develop nausea and vomiting that is not controlled by your nausea medication, call the clinic.  ° °BELOW ARE SYMPTOMS THAT SHOULD BE REPORTED IMMEDIATELY: °· *FEVER GREATER THAN 100.5 F °· *CHILLS WITH OR WITHOUT FEVER °· NAUSEA AND VOMITING THAT IS NOT CONTROLLED WITH YOUR NAUSEA MEDICATION °· *UNUSUAL SHORTNESS OF BREATH °· *UNUSUAL BRUISING OR BLEEDING °· TENDERNESS IN MOUTH AND THROAT WITH OR WITHOUT PRESENCE OF ULCERS °· *URINARY PROBLEMS °· *BOWEL PROBLEMS °· UNUSUAL RASH °Items with * indicate a potential emergency and should be followed up as soon as possible. ° °Feel free to call the clinic should you have any questions or concerns. The clinic phone number is [phone]. ° °Please show the CHEMO ALERT CARD at check-in to the Emergency Department and triage nurse. ° ° °

## 2019-11-06 ENCOUNTER — Telehealth: Payer: Self-pay | Admitting: *Deleted

## 2019-11-06 NOTE — Telephone Encounter (Signed)
This RN spoke with pt per noted INR of 1.3 - [name] states she has not missed any doses of her current regimen of 7.5 alt 5mg .  Today she is to take 5mg   Per recommendation - [name] will take 7.5 mg threes in a row and then resume alternating with 5 mg.  No further needs at this time.

## 2019-11-19 ENCOUNTER — Other Ambulatory Visit: Payer: Self-pay | Admitting: *Deleted

## 2019-11-19 MED ORDER — ALPRAZOLAM 1 MG PO TABS: 1.0000 mg | ORAL_TABLET | Freq: Three times a day (TID) | ORAL | 1 refills | Status: DC | PRN

## 2019-11-21 ENCOUNTER — Other Ambulatory Visit: Payer: Self-pay | Admitting: Oncology

## 2019-11-24 ENCOUNTER — Other Ambulatory Visit (HOSPITAL_COMMUNITY): Payer: Self-pay | Admitting: *Deleted

## 2019-11-24 MED ORDER — SPIRONOLACTONE 25 MG PO TABS: ORAL_TABLET | ORAL | 11 refills | Status: DC

## 2019-11-27 NOTE — Progress Notes (Signed)

## 2019-11-30 ENCOUNTER — Ambulatory Visit (HOSPITAL_BASED_OUTPATIENT_CLINIC_OR_DEPARTMENT_OTHER)
Admission: RE | Admit: 2019-11-30 | Discharge: 2019-11-30 | Disposition: A | Discharge status: Home / Self Care | Payer: Medicare Other | Source: Ambulatory Visit | Attending: Cardiology | Admitting: Cardiology

## 2019-11-30 ENCOUNTER — Ambulatory Visit (HOSPITAL_COMMUNITY)
Admission: RE | Admit: 2019-11-30 | Discharge: 2019-11-30 | Disposition: A | Discharge status: Home / Self Care | Payer: Medicare Other | Source: Ambulatory Visit | Attending: Cardiology | Admitting: Cardiology

## 2019-11-30 ENCOUNTER — Encounter (HOSPITAL_COMMUNITY): Payer: Self-pay | Admitting: Cardiology

## 2019-11-30 ENCOUNTER — Other Ambulatory Visit: Payer: Self-pay

## 2019-11-30 VITALS — BP 110/84 | HR 64 | Wt 288.8 lb

## 2019-11-30 DIAGNOSIS — I2510 Atherosclerotic heart disease of native coronary artery without angina pectoris: Secondary | ICD-10-CM | POA: Insufficient documentation

## 2019-11-30 DIAGNOSIS — Z9013 Acquired absence of bilateral breasts and nipples: Secondary | ICD-10-CM | POA: Insufficient documentation

## 2019-11-30 DIAGNOSIS — I5032 Chronic diastolic (congestive) heart failure: Secondary | ICD-10-CM | POA: Diagnosis not present

## 2019-11-30 DIAGNOSIS — Z79899 Other long term (current) drug therapy: Secondary | ICD-10-CM | POA: Insufficient documentation

## 2019-11-30 DIAGNOSIS — Z853 Personal history of malignant neoplasm of breast: Secondary | ICD-10-CM | POA: Insufficient documentation

## 2019-11-30 DIAGNOSIS — C50919 Malignant neoplasm of unspecified site of unspecified female breast: Secondary | ICD-10-CM | POA: Diagnosis not present

## 2019-11-30 DIAGNOSIS — Z7901 Long term (current) use of anticoagulants: Secondary | ICD-10-CM | POA: Insufficient documentation

## 2019-11-30 DIAGNOSIS — Z8249 Family history of ischemic heart disease and other diseases of the circulatory system: Secondary | ICD-10-CM | POA: Diagnosis not present

## 2019-11-30 DIAGNOSIS — Z7952 Long term (current) use of systemic steroids: Secondary | ICD-10-CM | POA: Diagnosis not present

## 2019-11-30 DIAGNOSIS — I110 Hypertensive heart disease with heart failure: Secondary | ICD-10-CM | POA: Diagnosis not present

## 2019-11-30 DIAGNOSIS — I871 Compression of vein: Secondary | ICD-10-CM | POA: Insufficient documentation

## 2019-11-30 DIAGNOSIS — I5022 Chronic systolic (congestive) heart failure: Secondary | ICD-10-CM | POA: Diagnosis not present

## 2019-11-30 LAB — LIPID PANEL
Cholesterol: 228 mg/dL — ABNORMAL HIGH (ref 0–200)
HDL: 105 mg/dL (ref >40)
LDL Cholesterol: 115 mg/dL — ABNORMAL HIGH (ref 0–99)
Total CHOL/HDL Ratio: 2.2 RATIO
Triglycerides: 40 mg/dL (ref <150)
VLDL: 8 mg/dL (ref 0–40)

## 2019-11-30 NOTE — Progress Notes (Signed)
Advanced Heart Failure Clinic Note   Patient ID: [name], female   DOB: Jul 05, 1951, 69 y.o.   MRN: [id number] Oncologist: Dr Jana Hakim  HPI: [name] is a 69 y.o. with a history of metastatic HER-2 positive breast carcinoma originally diagnosed September 2004. Started in R breast. Underwent neo-adjuvant chemo. During this time developed L breast CA. Underwent bilateral mastectomy. Lymph nodes +. 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   She received a total of 6 cycles of Taxotere/Carbo/Herceptin, completed in April 2005, after which she began single agent Herceptin, given every 4 weeks now. Plan to continue on Herceptin indefinitely.   Coronary CTA in 8/20 showed no significant CAD, calcium score 0.   She returns for cardio-oncology followup.  She has been doing well in general, no significant exertional dyspnea.  She takes Lasix about 3 days/week, usually when she notes some ankle edema or weight gain.  Her weight is up today compared to prior appt.  No lightheadedness. No chest pain.  No orthopnea/PND.   Labs 11/15: K 4, creatinine 1.0   Labs 1/16: K 4.1, creatinine 1.0 Labs 8/16: K 4.3, creatinine 0.9 Labs 11/16: K 3.9, creatinine 0.9 Labs 5/17: K 3.9, creatinine 0.68 Labs 5/18: K 4, creatinine 0.65 Labs 1/19: K 4.1, creatinine 1.23 Labs 7/19: K 3.9, creatinine 1.11 Labs 1/20: K 3.8, creatinine 0.83 Labs 7/20: K 4.1, creatinine 1.18, LDL 112 Labs 3/21: K 4.2, creatinine 0.86  04/23/12 ECHO EF 60-65% Lateral s' 8.9 cm/s  07/30/12 ECHO EF 60-65% Lateral s' 8.3 cm/s  09/11/12 ECHO EF 60-65% Lateral s' 10.3 cm/s  12/22/12 ECHO 55-60% Lateral S' 9.8 RV mildly dilated  07/01/13 ECHO 55-60%, lateral s' 9.79, mild RV dilation, grade II DD 3/15 ECHO 55%, mild MR, lateral s' 9.6, GLS -19.2% 03/02/14 ECHO EF 55-60% Lateral S' 9.4 GLS - 21.9  11/15 ECHO EF 60-65%, lateral S' 6.8, GLS -17.2%, mild RV dilation with normal systolic function,  mild MR.  2/16 ECHO EF 60-65%, lateral S' 14.4, GLS -62.2%, normal diastolic function, mild RV dilation with normal RV systolic function.  8/16 ECHO EF 60-65%, mild LVH, normal RV size and systolic function, lateral s' 13.2, GLS -17%.  11/16 ECHO EF 60-65% Lateral S' 10.2, GLS -20.2% 5/17 ECHO EF 60-65%, mild LVH, lateral s' 12.2, GLS -20.8%, grade II diastolic dysfunction, normal RV. 9/17 ECHO EF 63-33%, normal diastolic function, GLS -54.5% 12/17 ECHO EF 55-60%, moderate diastolic dysfunction, GLS -20.1%, normal RV size and systolic function 6/25 ECHO EF 55-60%, GLS -63.8%, normal diastolic function, normal RV size and systolic function.  1/19 ECHO EF 55-60%, GLS -93.7%, normal diastolic function, RV normal size and systolic function.  7/19 ECHO EF 60-65%, GLS -22.1%.  1/20 ECHO EF 34-28%, normal diastolic function, normal RV, strain not done.  6/20 ECHO EF 76-81%, normal diastolic function, normal RV, GLS -24.4% 4/21 ECHO EF 15-72%, normal diastolic function, normal RV, strain tracking was not accurate.    ROS: All systems negative except as listed in HPI, PMH and Problem List.  Past Medical History:  Diagnosis Date  . Breast cancer ([place])    mets to liver and lung  . Breast cancer metastasized to multiple sites ([place]) 02/26/2013  . History of chemotherapy Feb. 2006   taxotere/herceptin/carboplatin  . Hypertension   . Neuropathy   . Radiation 07/31/2006   left upper chest  . Radiation 06/17/2006-06/27/2006   6480 cGy bilat. chest wall  . SVC syndrome   .  Thrombosis     Current Outpatient Medications  Medication Sig Dispense Refill  . acetaminophen (TYLENOL) 500 MG tablet Take 1,000 mg by mouth every 6 (six) hours as needed for mild pain or fever.     Marland Kitchen albuterol (PROVENTIL HFA;VENTOLIN HFA) 108 (90 BASE) MCG/ACT inhaler Inhale 2 puffs into the lungs every 6 (six) hours as needed for wheezing. 1 Inhaler 5  . ALPRAZolam (XANAX) 1 MG tablet Take 1 tablet (1 mg total) by mouth 3 (three)  times daily as needed. for anxiety 60 tablet 1  . amLODipine (NORVASC) 10 MG tablet Take 1 tablet (10 mg total) by mouth every morning. 30 tablet 4  . baclofen (LIORESAL) 10 MG tablet TAKE 1 TABLET BY MOUTH THREE TIMES DAILY AS NEEDED FOR MUSCLE SPASMS 270 tablet 0  . carvedilol (COREG) 12.5 MG tablet Take 1 tablet (12.5 mg total) by mouth 2 (two) times daily. 60 tablet 6  . diclofenac sodium (VOLTAREN) 1 % GEL Apply 2 g topically daily as needed (for pain). Apply to knees and shoulders 100 g 6  . furosemide (LASIX) 40 MG tablet TAKE 1 TABLET(40 MG) BY MOUTH DAILY 30 tablet 3  . gabapentin (NEURONTIN) 300 MG capsule TAKE 2 CAPSULES(600 MG) BY MOUTH THREE TIMES DAILY 540 capsule 0  . losartan (COZAAR) 100 MG tablet Take 100 mg by mouth every morning.    . mupirocin ointment (BACTROBAN) 2 % APP EXT TO SORE ON FOOT TID FOR 5 DAYS    . oxyCODONE-acetaminophen (PERCOCET) 10-325 MG tablet Take 1 tablet by mouth 4 (four) times daily as needed.    . potassium chloride (K-DUR,KLOR-CON) 20 MEQ tablet Take 1 tablet (20 mEq total) by mouth daily. 30 tablet 3  . predniSONE (DELTASONE) 5 MG tablet     . spironolactone (ALDACTONE) 25 MG tablet TAKE 1 TABLET(25 MG) BY MOUTH DAILY 30 tablet 11  . temazepam (RESTORIL) 30 MG capsule TAKE ONE CAPSULE BY MOUTH EVERY NIGHT AT BEDTIME AS NEEDED FOR SLEEP 30 capsule 2  . warfarin (COUMADIN) 5 MG tablet Take one tablet on odd numbered days and one and a half tablets on even numbered days 90 tablet 4   No current facility-administered medications for this encounter.   Facility-Administered Medications Ordered in Other Encounters  Medication Dose Route Frequency Provider Last Rate Last Admin  . sodium chloride flush (NS) 0.9 % injection 10 mL  10 mL Intravenous PRN Magrinat, Virgie Dad, MD   10 mL at 12/15/15 1200  . sodium chloride flush (NS) 0.9 % injection 10 mL  10 mL Intracatheter PRN Magrinat, Virgie Dad, MD   10 mL at 08/14/18 1116  . sodium chloride flush (NS) 0.9 %  injection 10 mL  10 mL Intracatheter PRN Magrinat, Virgie Dad, MD        Vitals:   11/30/19 1005  BP: 110/84  Pulse: 64  SpO2: 97%  Weight: 131 kg (288 lb 12.8 oz)    PHYSICAL EXAM: General: NAD. Obese.  Neck: JVP 8 cm, no thyromegaly or thyroid nodule.  Lungs: Clear to auscultation bilaterally with normal respiratory effort. CV: Nondisplaced PMI.  Heart regular S1/S2, no S3/S4, no murmur.  Trace ankle edema.  No carotid bruit.  Normal pedal pulses.  Abdomen: Soft, nontender, no hepatosplenomegaly, no distention.  Skin: Intact without lesions or rashes.  Neurologic: Alert and oriented x 3.  Psych: Normal affect. Extremities: No clubbing or cyanosis.  HEENT: Normal.   ASSESSMENT & PLAN: 1) L Breast Cancer s/p bilateral mastectomies:  Symptomatically stable. Echo reviewed today, EF normal.  Strain did not appear accurate due to poor tracking.  She will be continuing Herceptin indefinitely.   - Repeat echo in 6 months again.   2) Suspected OSA: She has not wanted to do a sleep study.    3) HTN: BP controlled on current regimen.     4) Chronic diastolic CHF: Mild volume overload by exam.   - I will have her take Lasix 3 days in a row then back to three times a week.  5) SVC syndrome: Continue warfarin. 6) Coronary disease risk: She has a strong FH of coronary disease, as well as HTN.  Coronary CTA in 8/20 showed no significant coronary disease and calcium score 0.  - No ASA given warfarin use.  - Check lipids again today.   Followup 6 months with echo.   Loralie Champagne 11/30/2019

## 2019-11-30 NOTE — Progress Notes (Signed)
  Echocardiogram 2D Echocardiogram has been performed.  Matilde Bash 11/30/2019, 9:42 AM

## 2019-11-30 NOTE — Patient Instructions (Signed)
Take Lasix 40mg  daily for 3 days THEN you can go back to 3 days a week.    Labs today We will only contact you if something comes back abnormal or we need to make some changes. Otherwise no news is good news!   Your physician has requested that you have an echocardiogram in 6 months. Echocardiography is a painless test that uses sound waves to create images of your heart. It provides your doctor with information about the size and shape of your heart and how well your heart's chambers and valves are working. This procedure takes approximately one hour. There are no restrictions for this procedure.  Your physician recommends that you schedule a follow-up appointment in: 6 months for an ECHO and visit with Dr Aundra Dubin.  Please call us in August to schedule this appointment.    Please call office at [phone] option [phone] if you have any questions or concerns.   At the [facility], you and your health needs are our priority. As part of our continuing mission to provide you with exceptional heart care, we have created designated Provider Care Teams. These Care Teams include your primary Cardiologist (physician) and Advanced Practice Providers (APPs- Physician Assistants and Nurse Practitioners) who all work together to provide you with the care you need, when you need it.   You may see any of the following providers on your designated Care Team at your next follow up: Marland Kitchen Dr Glori Bickers . Dr Loralie Champagne . Darrick Grinder, NP . Lyda Jester, PA . Audry Riles, PharmD   Please be sure to bring in all your medications bottles to every appointment.

## 2019-12-03 ENCOUNTER — Inpatient Hospital Stay: Payer: Medicare Other | Attending: Oncology

## 2019-12-03 ENCOUNTER — Other Ambulatory Visit: Payer: Self-pay

## 2019-12-03 ENCOUNTER — Inpatient Hospital Stay: Payer: Medicare Other

## 2019-12-03 VITALS — BP 132/71 | HR 63 | Temp 98.2°F | Resp 18

## 2019-12-03 DIAGNOSIS — C50812 Malignant neoplasm of overlapping sites of left female breast: Secondary | ICD-10-CM | POA: Insufficient documentation

## 2019-12-03 DIAGNOSIS — C50911 Malignant neoplasm of unspecified site of right female breast: Secondary | ICD-10-CM | POA: Diagnosis not present

## 2019-12-03 DIAGNOSIS — C50919 Malignant neoplasm of unspecified site of unspecified female breast: Secondary | ICD-10-CM

## 2019-12-03 DIAGNOSIS — D689 Coagulation defect, unspecified: Secondary | ICD-10-CM | POA: Diagnosis not present

## 2019-12-03 DIAGNOSIS — Z5112 Encounter for antineoplastic immunotherapy: Secondary | ICD-10-CM | POA: Insufficient documentation

## 2019-12-03 LAB — CMP (CANCER CENTER ONLY)
ALT: 20 U/L (ref 0–44)
AST: 15 U/L (ref 15–41)
Albumin: 3.6 g/dL (ref 3.5–5.0)
Alkaline Phosphatase: 70 U/L (ref 38–126)
Anion gap: 10 (ref 5–15)
BUN: 24 mg/dL — ABNORMAL HIGH (ref 8–23)
CO2: 29 mmol/L (ref 22–32)
Calcium: 9.5 mg/dL (ref 8.9–10.3)
Chloride: 103 mmol/L (ref 98–111)
Creatinine: 0.9 mg/dL (ref 0.44–1.00)
GFR, Est AFR Am: >60 mL/min (ref >60)
GFR, Estimated: >60 mL/min (ref >60)
Glucose, Bld: 103 mg/dL — ABNORMAL HIGH (ref 70–99)
Potassium: 4.1 mmol/L (ref 3.5–5.1)
Sodium: 142 mmol/L (ref 135–145)
Total Bilirubin: 0.7 mg/dL (ref 0.3–1.2)
Total Protein: 7.6 g/dL (ref 6.5–8.1)

## 2019-12-03 LAB — CBC WITH DIFFERENTIAL (CANCER CENTER ONLY)
Abs Immature Granulocytes: 0.03 10*3/uL (ref 0.00–0.07)
Basophils Absolute: 0 10*3/uL (ref 0.0–0.1)
Basophils Relative: 0 %
Eosinophils Absolute: 0.1 10*3/uL (ref 0.0–0.5)
Eosinophils Relative: 1 %
HCT: 36.4 % (ref 36.0–46.0)
Hemoglobin: 12.1 g/dL (ref 12.0–15.0)
Immature Granulocytes: 0 %
Lymphocytes Relative: 19 %
Lymphs Abs: 2.1 10*3/uL (ref 0.7–4.0)
MCH: 28.3 pg (ref 26.0–34.0)
MCHC: 33.2 g/dL (ref 30.0–36.0)
MCV: 85.2 fL (ref 80.0–100.0)
Monocytes Absolute: 0.8 10*3/uL (ref 0.1–1.0)
Monocytes Relative: 7 %
Neutro Abs: 8.1 10*3/uL — ABNORMAL HIGH (ref 1.7–7.7)
Neutrophils Relative %: 73 %
Platelet Count: 241 10*3/uL (ref 150–400)
RBC: 4.27 MIL/uL (ref 3.87–5.11)
RDW: 16.3 % — ABNORMAL HIGH (ref 11.5–15.5)
WBC Count: 11.2 10*3/uL — ABNORMAL HIGH (ref 4.0–10.5)
nRBC: 0 % (ref 0.0–0.2)

## 2019-12-03 LAB — PROTIME-INR
INR: 1.4 — ABNORMAL HIGH (ref 0.8–1.2)
Prothrombin Time: 17.2 seconds — ABNORMAL HIGH (ref 11.4–15.2)

## 2019-12-03 MED ORDER — ACETAMINOPHEN 325 MG PO TABS
650.0000 mg | ORAL_TABLET | Freq: Once | ORAL | Status: AC
  Administered 2019-12-03: 650 mg via ORAL

## 2019-12-03 MED ORDER — SODIUM CHLORIDE 0.9% FLUSH
10.0000 mL | INTRAVENOUS | Status: DC | PRN
  Administered 2019-12-03: 10 mL
  Filled 2019-12-03: qty 10

## 2019-12-03 MED ORDER — DIPHENHYDRAMINE HCL 25 MG PO CAPS
25.0000 mg | ORAL_CAPSULE | Freq: Once | ORAL | Status: AC
  Administered 2019-12-03: 25 mg via ORAL

## 2019-12-03 MED ORDER — TRASTUZUMAB-DKST CHEMO 150 MG IV SOLR
750.0000 mg | Freq: Once | INTRAVENOUS | Status: AC
  Administered 2019-12-03: 750 mg via INTRAVENOUS
  Filled 2019-12-03: qty 35.72

## 2019-12-03 MED ORDER — ACETAMINOPHEN 325 MG PO TABS
ORAL_TABLET | ORAL | Status: AC
  Filled 2019-12-03: qty 2

## 2019-12-03 MED ORDER — SODIUM CHLORIDE 0.9 % IV SOLN
Freq: Once | INTRAVENOUS | Status: AC
  Filled 2019-12-03: qty 250

## 2019-12-03 MED ORDER — LORAZEPAM 2 MG/ML IJ SOLN
1.0000 mg | Freq: Once | INTRAMUSCULAR | Status: AC | PRN
  Administered 2019-12-03: 1 mg via INTRAVENOUS

## 2019-12-03 MED ORDER — LORAZEPAM 2 MG/ML IJ SOLN
INTRAMUSCULAR | Status: AC
  Filled 2019-12-03: qty 1

## 2019-12-03 MED ORDER — DIPHENHYDRAMINE HCL 25 MG PO CAPS
ORAL_CAPSULE | ORAL | Status: AC
  Filled 2019-12-03: qty 1

## 2019-12-03 MED ORDER — HEPARIN SOD (PORK) LOCK FLUSH 100 UNIT/ML IV SOLN
500.0000 [IU] | Freq: Once | INTRAVENOUS | Status: AC | PRN
  Administered 2019-12-03: 500 [IU]
  Filled 2019-12-03: qty 5

## 2019-12-03 NOTE — Patient Instructions (Signed)
[facility] °Discharge Instructions for Patients Receiving Chemotherapy ° °Today you received the following chemotherapy agents Trastuzumab ° °To help prevent nausea and vomiting after your treatment, we encourage you to take your nausea medication as directed. °  °If you develop nausea and vomiting that is not controlled by your nausea medication, call the clinic.  ° °BELOW ARE SYMPTOMS THAT SHOULD BE REPORTED IMMEDIATELY: °· *FEVER GREATER THAN 100.5 F °· *CHILLS WITH OR WITHOUT FEVER °· NAUSEA AND VOMITING THAT IS NOT CONTROLLED WITH YOUR NAUSEA MEDICATION °· *UNUSUAL SHORTNESS OF BREATH °· *UNUSUAL BRUISING OR BLEEDING °· TENDERNESS IN MOUTH AND THROAT WITH OR WITHOUT PRESENCE OF ULCERS °· *URINARY PROBLEMS °· *BOWEL PROBLEMS °· UNUSUAL RASH °Items with * indicate a potential emergency and should be followed up as soon as possible. ° °Feel free to call the clinic should you have any questions or concerns. The clinic phone number is [phone]. ° °Please show the CHEMO ALERT CARD at check-in to the Emergency Department and triage nurse. ° ° °

## 2019-12-04 ENCOUNTER — Telehealth: Payer: Self-pay | Admitting: *Deleted

## 2019-12-04 NOTE — Telephone Encounter (Signed)
Message left for pt to return call per noted low INR and need for coumadin dose update and further recommendation.

## 2019-12-10 ENCOUNTER — Telehealth: Payer: Self-pay

## 2019-12-10 NOTE — Progress Notes (Signed)
MD reviewed previous encounter, MD OK with recheck on 4/21.

## 2019-12-10 NOTE — Telephone Encounter (Signed)
RN placed called regarding INR results.  Pt is currently taking Coumadin 7.5mg  tablet on Tue/Thur/Saturday.  Coumadin 5mg  tablet on Mon/Wed/Fri/Sunday.   Pt reports she is unable to come to our office for recheck due to transportation, and personal conflicts.  Pt denies any abnormal bruising or bleeding.    Pt sees PCP on 4/20 and will have PT/INR drawn at this time and have results faxed to [place].  Will notify MD.

## 2019-12-23 ENCOUNTER — Other Ambulatory Visit: Payer: Self-pay | Admitting: *Deleted

## 2019-12-23 DIAGNOSIS — C50919 Malignant neoplasm of unspecified site of unspecified female breast: Secondary | ICD-10-CM

## 2019-12-23 MED ORDER — WARFARIN SODIUM 2.5 MG PO TABS: 2.5000 mg | ORAL_TABLET | Freq: Every day | ORAL | 3 refills | Status: DC

## 2019-12-23 MED ORDER — TEMAZEPAM 30 MG PO CAPS: ORAL_CAPSULE | ORAL | 2 refills | Status: DC

## 2019-12-25 NOTE — Progress Notes (Signed)
Pharmacist Chemotherapy Monitoring - Follow Up Assessment    I verify that I have reviewed each item in the below checklist:  . Regimen for the patient is scheduled for the appropriate day and plan matches scheduled date. Marland Kitchen Appropriate non-routine labs are ordered dependent on drug ordered. . If applicable, additional medications reviewed and ordered per protocol based on lifetime cumulative doses and/or treatment regimen.   Plan for follow-up and/or issues identified: No . I-vent associated with next due treatment: No . MD and/or nursing notified: No  Philomena Course 12/25/2019 11:22 AM

## 2019-12-31 ENCOUNTER — Other Ambulatory Visit: Payer: Self-pay

## 2019-12-31 ENCOUNTER — Inpatient Hospital Stay: Payer: Medicare Other

## 2019-12-31 ENCOUNTER — Inpatient Hospital Stay: Payer: Medicare Other | Attending: Oncology

## 2019-12-31 VITALS — BP 155/70 | HR 67 | Temp 98.2°F | Resp 18 | Ht 63.0 in | Wt 284.6 lb

## 2019-12-31 DIAGNOSIS — D689 Coagulation defect, unspecified: Secondary | ICD-10-CM | POA: Diagnosis not present

## 2019-12-31 DIAGNOSIS — Z5112 Encounter for antineoplastic immunotherapy: Secondary | ICD-10-CM | POA: Diagnosis not present

## 2019-12-31 DIAGNOSIS — C50911 Malignant neoplasm of unspecified site of right female breast: Secondary | ICD-10-CM | POA: Diagnosis not present

## 2019-12-31 DIAGNOSIS — C50919 Malignant neoplasm of unspecified site of unspecified female breast: Secondary | ICD-10-CM

## 2019-12-31 DIAGNOSIS — C50912 Malignant neoplasm of unspecified site of left female breast: Secondary | ICD-10-CM

## 2019-12-31 DIAGNOSIS — C50812 Malignant neoplasm of overlapping sites of left female breast: Secondary | ICD-10-CM | POA: Insufficient documentation

## 2019-12-31 LAB — CBC WITH DIFFERENTIAL (CANCER CENTER ONLY)
Abs Immature Granulocytes: 0.03 10*3/uL (ref 0.00–0.07)
Basophils Absolute: 0 10*3/uL (ref 0.0–0.1)
Basophils Relative: 0 %
Eosinophils Absolute: 0.1 10*3/uL (ref 0.0–0.5)
Eosinophils Relative: 1 %
HCT: 34.2 % — ABNORMAL LOW (ref 36.0–46.0)
Hemoglobin: 11.5 g/dL — ABNORMAL LOW (ref 12.0–15.0)
Immature Granulocytes: 0 %
Lymphocytes Relative: 17 %
Lymphs Abs: 1.7 10*3/uL (ref 0.7–4.0)
MCH: 28.5 pg (ref 26.0–34.0)
MCHC: 33.6 g/dL (ref 30.0–36.0)
MCV: 84.7 fL (ref 80.0–100.0)
Monocytes Absolute: 0.6 10*3/uL (ref 0.1–1.0)
Monocytes Relative: 6 %
Neutro Abs: 7.5 10*3/uL (ref 1.7–7.7)
Neutrophils Relative %: 76 %
Platelet Count: 253 10*3/uL (ref 150–400)
RBC: 4.04 MIL/uL (ref 3.87–5.11)
RDW: 15.9 % — ABNORMAL HIGH (ref 11.5–15.5)
WBC Count: 9.9 10*3/uL (ref 4.0–10.5)
nRBC: 0 % (ref 0.0–0.2)

## 2019-12-31 LAB — CMP (CANCER CENTER ONLY)
ALT: 11 U/L (ref 0–44)
AST: 10 U/L — ABNORMAL LOW (ref 15–41)
Albumin: 3.6 g/dL (ref 3.5–5.0)
Alkaline Phosphatase: 71 U/L (ref 38–126)
Anion gap: 9 (ref 5–15)
BUN: 41 mg/dL — ABNORMAL HIGH (ref 8–23)
CO2: 25 mmol/L (ref 22–32)
Calcium: 9.2 mg/dL (ref 8.9–10.3)
Chloride: 107 mmol/L (ref 98–111)
Creatinine: 1.68 mg/dL — ABNORMAL HIGH (ref 0.44–1.00)
GFR, Est AFR Am: 36 mL/min — ABNORMAL LOW (ref >60)
GFR, Estimated: 31 mL/min — ABNORMAL LOW (ref >60)
Glucose, Bld: 103 mg/dL — ABNORMAL HIGH (ref 70–99)
Potassium: 4.5 mmol/L (ref 3.5–5.1)
Sodium: 141 mmol/L (ref 135–145)
Total Bilirubin: 0.5 mg/dL (ref 0.3–1.2)
Total Protein: 7.5 g/dL (ref 6.5–8.1)

## 2019-12-31 LAB — PROTIME-INR
INR: 2.1 — ABNORMAL HIGH (ref 0.8–1.2)
Prothrombin Time: 23.2 seconds — ABNORMAL HIGH (ref 11.4–15.2)

## 2019-12-31 MED ORDER — ACETAMINOPHEN 325 MG PO TABS
ORAL_TABLET | ORAL | Status: AC
  Filled 2019-12-31: qty 2

## 2019-12-31 MED ORDER — SODIUM CHLORIDE 0.9 % IV SOLN
Freq: Once | INTRAVENOUS | Status: AC
  Filled 2019-12-31: qty 250

## 2019-12-31 MED ORDER — HEPARIN SOD (PORK) LOCK FLUSH 100 UNIT/ML IV SOLN
500.0000 [IU] | Freq: Once | INTRAVENOUS | Status: AC | PRN
  Administered 2019-12-31: 500 [IU]
  Filled 2019-12-31: qty 5

## 2019-12-31 MED ORDER — LORAZEPAM 2 MG/ML IJ SOLN
1.0000 mg | Freq: Once | INTRAMUSCULAR | Status: AC | PRN
  Administered 2019-12-31: 1 mg via INTRAVENOUS

## 2019-12-31 MED ORDER — TRASTUZUMAB-DKST CHEMO 150 MG IV SOLR
750.0000 mg | Freq: Once | INTRAVENOUS | Status: AC
  Administered 2019-12-31: 750 mg via INTRAVENOUS
  Filled 2019-12-31: qty 35.72

## 2019-12-31 MED ORDER — LORAZEPAM 1 MG PO TABS
ORAL_TABLET | ORAL | Status: AC
  Filled 2019-12-31: qty 1

## 2019-12-31 MED ORDER — ACETAMINOPHEN 325 MG PO TABS
650.0000 mg | ORAL_TABLET | Freq: Once | ORAL | Status: AC
  Administered 2019-12-31: 650 mg via ORAL

## 2019-12-31 MED ORDER — DIPHENHYDRAMINE HCL 25 MG PO CAPS
25.0000 mg | ORAL_CAPSULE | Freq: Once | ORAL | Status: AC
  Administered 2019-12-31: 25 mg via ORAL

## 2019-12-31 MED ORDER — SODIUM CHLORIDE 0.9% FLUSH
10.0000 mL | INTRAVENOUS | Status: DC | PRN
  Administered 2019-12-31: 10 mL
  Filled 2019-12-31: qty 10

## 2019-12-31 MED ORDER — DIPHENHYDRAMINE HCL 25 MG PO CAPS
ORAL_CAPSULE | ORAL | Status: AC
  Filled 2019-12-31: qty 1

## 2019-12-31 MED ORDER — LORAZEPAM 2 MG/ML IJ SOLN
INTRAMUSCULAR | Status: AC
  Filled 2019-12-31: qty 1

## 2019-12-31 NOTE — Patient Instructions (Signed)
[facility] Discharge Instructions for Patients Receiving Chemotherapy  Today you received the following chemotherapy agents: Ogivri  To help prevent nausea and vomiting after your treatment, we encourage you to take your nausea medication as prescribed.    If you develop nausea and vomiting that is not controlled by your nausea medication, call the clinic.   BELOW ARE SYMPTOMS THAT SHOULD BE REPORTED IMMEDIATELY:  *FEVER GREATER THAN 100.5 F  *CHILLS WITH OR WITHOUT FEVER  NAUSEA AND VOMITING THAT IS NOT CONTROLLED WITH YOUR NAUSEA MEDICATION  *UNUSUAL SHORTNESS OF BREATH  *UNUSUAL BRUISING OR BLEEDING  TENDERNESS IN MOUTH AND THROAT WITH OR WITHOUT PRESENCE OF ULCERS  *URINARY PROBLEMS  *BOWEL PROBLEMS  UNUSUAL RASH Items with * indicate a potential emergency and should be followed up as soon as possible.  Feel free to call the clinic should you have any questions or concerns. The clinic phone number is [phone].  Please show the CHEMO ALERT CARD at check-in to the Emergency Department and triage nurse.   

## 2020-01-26 ENCOUNTER — Other Ambulatory Visit: Payer: Self-pay | Admitting: *Deleted

## 2020-01-26 DIAGNOSIS — M2550 Pain in unspecified joint: Secondary | ICD-10-CM | POA: Diagnosis not present

## 2020-01-26 MED ORDER — ALPRAZOLAM 1 MG PO TABS: 1.0000 mg | ORAL_TABLET | Freq: Three times a day (TID) | ORAL | 1 refills | Status: DC | PRN

## 2020-01-28 ENCOUNTER — Ambulatory Visit: Payer: Medicare Other

## 2020-01-28 ENCOUNTER — Inpatient Hospital Stay: Payer: Medicare Other | Attending: Oncology

## 2020-01-28 ENCOUNTER — Other Ambulatory Visit: Payer: Self-pay

## 2020-01-28 ENCOUNTER — Inpatient Hospital Stay (HOSPITAL_BASED_OUTPATIENT_CLINIC_OR_DEPARTMENT_OTHER): Payer: Medicare Other | Admitting: Oncology

## 2020-01-28 ENCOUNTER — Inpatient Hospital Stay: Payer: Medicare Other

## 2020-01-28 ENCOUNTER — Other Ambulatory Visit: Payer: Medicare Other

## 2020-01-28 VITALS — BP 152/81 | HR 66 | Temp 98.3°F | Resp 17 | Ht 63.0 in | Wt 283.0 lb

## 2020-01-28 DIAGNOSIS — Z171 Estrogen receptor negative status [ER-]: Secondary | ICD-10-CM | POA: Diagnosis not present

## 2020-01-28 DIAGNOSIS — C50911 Malignant neoplasm of unspecified site of right female breast: Secondary | ICD-10-CM

## 2020-01-28 DIAGNOSIS — C50912 Malignant neoplasm of unspecified site of left female breast: Secondary | ICD-10-CM | POA: Diagnosis not present

## 2020-01-28 DIAGNOSIS — Z7901 Long term (current) use of anticoagulants: Secondary | ICD-10-CM | POA: Insufficient documentation

## 2020-01-28 DIAGNOSIS — R7881 Bacteremia: Secondary | ICD-10-CM | POA: Diagnosis not present

## 2020-01-28 DIAGNOSIS — I871 Compression of vein: Secondary | ICD-10-CM

## 2020-01-28 DIAGNOSIS — C50919 Malignant neoplasm of unspecified site of unspecified female breast: Secondary | ICD-10-CM | POA: Diagnosis not present

## 2020-01-28 DIAGNOSIS — C50812 Malignant neoplasm of overlapping sites of left female breast: Secondary | ICD-10-CM | POA: Diagnosis present

## 2020-01-28 DIAGNOSIS — Z5112 Encounter for antineoplastic immunotherapy: Secondary | ICD-10-CM | POA: Diagnosis not present

## 2020-01-28 DIAGNOSIS — Z7189 Other specified counseling: Secondary | ICD-10-CM | POA: Diagnosis not present

## 2020-01-28 LAB — CBC WITH DIFFERENTIAL (CANCER CENTER ONLY)
Abs Immature Granulocytes: 0.02 10*3/uL (ref 0.00–0.07)
Basophils Absolute: 0 10*3/uL (ref 0.0–0.1)
Basophils Relative: 1 %
Eosinophils Absolute: 0.2 10*3/uL (ref 0.0–0.5)
Eosinophils Relative: 2 %
HCT: 34 % — ABNORMAL LOW (ref 36.0–46.0)
Hemoglobin: 11.6 g/dL — ABNORMAL LOW (ref 12.0–15.0)
Immature Granulocytes: 0 %
Lymphocytes Relative: 27 %
Lymphs Abs: 2.2 10*3/uL (ref 0.7–4.0)
MCH: 28.7 pg (ref 26.0–34.0)
MCHC: 34.1 g/dL (ref 30.0–36.0)
MCV: 84.2 fL (ref 80.0–100.0)
Monocytes Absolute: 0.9 10*3/uL (ref 0.1–1.0)
Monocytes Relative: 11 %
Neutro Abs: 4.9 10*3/uL (ref 1.7–7.7)
Neutrophils Relative %: 59 %
Platelet Count: 265 10*3/uL (ref 150–400)
RBC: 4.04 MIL/uL (ref 3.87–5.11)
RDW: 14.8 % (ref 11.5–15.5)
WBC Count: 8.2 10*3/uL (ref 4.0–10.5)
nRBC: 0 % (ref 0.0–0.2)

## 2020-01-28 LAB — CMP (CANCER CENTER ONLY)
ALT: 9 U/L (ref 0–44)
AST: 12 U/L — ABNORMAL LOW (ref 15–41)
Albumin: 3.5 g/dL (ref 3.5–5.0)
Alkaline Phosphatase: 77 U/L (ref 38–126)
Anion gap: 9 (ref 5–15)
BUN: 25 mg/dL — ABNORMAL HIGH (ref 8–23)
CO2: 27 mmol/L (ref 22–32)
Calcium: 9.5 mg/dL (ref 8.9–10.3)
Chloride: 108 mmol/L (ref 98–111)
Creatinine: 1.19 mg/dL — ABNORMAL HIGH (ref 0.44–1.00)
GFR, Est AFR Am: 54 mL/min — ABNORMAL LOW (ref >60)
GFR, Estimated: 47 mL/min — ABNORMAL LOW (ref >60)
Glucose, Bld: 98 mg/dL (ref 70–99)
Potassium: 3.9 mmol/L (ref 3.5–5.1)
Sodium: 144 mmol/L (ref 135–145)
Total Bilirubin: 0.4 mg/dL (ref 0.3–1.2)
Total Protein: 7.3 g/dL (ref 6.5–8.1)

## 2020-01-28 LAB — PROTIME-INR
INR: 2.3 — ABNORMAL HIGH (ref 0.8–1.2)
Prothrombin Time: 24.1 seconds — ABNORMAL HIGH (ref 11.4–15.2)

## 2020-01-28 MED ORDER — SODIUM CHLORIDE 0.9% FLUSH
10.0000 mL | INTRAVENOUS | Status: DC | PRN
  Administered 2020-01-28: 10 mL
  Filled 2020-01-28: qty 10

## 2020-01-28 MED ORDER — DIPHENHYDRAMINE HCL 25 MG PO CAPS
25.0000 mg | ORAL_CAPSULE | Freq: Once | ORAL | Status: AC
  Administered 2020-01-28: 25 mg via ORAL

## 2020-01-28 MED ORDER — SODIUM CHLORIDE 0.9 % IV SOLN
Freq: Once | INTRAVENOUS | Status: AC
  Filled 2020-01-28: qty 250

## 2020-01-28 MED ORDER — ACETAMINOPHEN 325 MG PO TABS
ORAL_TABLET | ORAL | Status: AC
  Filled 2020-01-28: qty 2

## 2020-01-28 MED ORDER — LORAZEPAM 2 MG/ML IJ SOLN
1.0000 mg | Freq: Once | INTRAMUSCULAR | Status: AC | PRN
  Administered 2020-01-28: 1 mg via INTRAVENOUS

## 2020-01-28 MED ORDER — HEPARIN SOD (PORK) LOCK FLUSH 100 UNIT/ML IV SOLN
500.0000 [IU] | Freq: Once | INTRAVENOUS | Status: AC | PRN
  Administered 2020-01-28: 500 [IU]
  Filled 2020-01-28: qty 5

## 2020-01-28 MED ORDER — LORAZEPAM 2 MG/ML IJ SOLN
INTRAMUSCULAR | Status: AC
  Filled 2020-01-28: qty 1

## 2020-01-28 MED ORDER — TRASTUZUMAB-DKST CHEMO 150 MG IV SOLR
750.0000 mg | Freq: Once | INTRAVENOUS | Status: AC
  Administered 2020-01-28: 750 mg via INTRAVENOUS
  Filled 2020-01-28: qty 35.72

## 2020-01-28 MED ORDER — ACETAMINOPHEN 325 MG PO TABS
650.0000 mg | ORAL_TABLET | Freq: Once | ORAL | Status: AC
  Administered 2020-01-28: 650 mg via ORAL

## 2020-01-28 MED ORDER — DIPHENHYDRAMINE HCL 25 MG PO CAPS
ORAL_CAPSULE | ORAL | Status: AC
  Filled 2020-01-28: qty 1

## 2020-01-28 NOTE — Progress Notes (Signed)
[facility]  [phone] [phone]) [phone]    ID: [id number]   DOB: 29-Sep-1950  MR#: [id number]  QQI#:[id number]   Patient Care Team: Ernestene Kiel, MD as PCP - General (Internal Medicine) Haze Rushing., MD as Consulting Physician (Pain Medicine) Joya Salm, MD as Referring Physician (Orthopedic Surgery) Jaymes Graff, DO as Consulting Physician (Orthopedic Surgery) Larey Dresser, MD as Consulting Physician (Cardiology) Bensimhon, Shaune Pascal, MD as Consulting Physician (Cardiology) Lorelle Gibbs, MD (Radiology) Tommy Medal, Lavell Islam, MD as Consulting Physician (Infectious Diseases) Gery Pray, MD as Consulting Physician (Radiation Oncology) Lynze Reddy, Virgie Dad, MD as Consulting Physician (Oncology)   CHIEF COMPLAINT: Metastatic HER-2 positive, estrogen receptor negative breast cancer (s/p bilateral mastectomies)  CURRENT TREATMENT: Trastuzumab every 28 days; lifelong warfarin   INTERVAL HISTORY: Sada returns today for follow-up and treatment of her estrogen receptor negative, but HER2 amplified breast cancer.   She continues on trastuzumab, which she receives every 28 days.  She has absolutely no side effects related to this that she is aware of.   Since her last visit, she underwent repeat echocardiogram on 11/30/2019 showing an ejection fraction of 55-60%.  She is on lifelong warfarin. She tolerates this well and without any noticeable side effects. Her PT:INR today is pending. Lab Results  Component Value Date   INR 2.1 (H) 12/31/2019   INR 1.4 (H) 12/03/2019   INR 1.3 (H) 11/05/2019   INR 1.5 (H) 10/08/2019   INR 2.9 (H) 09/10/2019   Her most recent PET scan was 12/26/2018 and showed no evidence of active disease   REVIEW OF SYSTEMS: Cherly continues to be the primary caregiver for her husband.  She also does quite a bit of housework and gardening and she fell while weed eating she says.  She bruised her arms and  hurt her knees.  She did get over that without difficulty.  She is interested in going to [facility] in [place].  She says she may need a physician referral and I will be glad to do that if that is appropriate.  She will make sure that I get the information.  Otherwise a detailed review of systems today was stable   BREAST CANCER HISTORY:   From the earlier summary:  Vallory Oetken is 69 years old [name] female.  This woman has been in good health all of her life.  She noted a swelling and discomfort in her right breast in June 2004.  She was seen in the Emergency Room in [place] and was treated for mastitis.  She was treated for a number of months with mastitis and the swelling did not get better. She was given hydrocodone and Cipro.  Finally, the swelling did get better and ultimately the nipple became retracted and she noticed some dimpling in her skin. She had a mammogram in July of 2004 in [place] with subsequent mammogram on May 27, 2003, by Dr. Isaiah Blakes.  Mammogram done on September 30 showed marked increased density in the left breast.  Biopsy was performed the same day.  It was noted at the 12 o'clock position, deep in the breast was a focal hypoechoic mass, at least 3.5 cm in diameter.  Biopsy did in fact show invasive in situ mammary carcinoma. This was felt to be both at least intermediate, high grade.  No definite lymphovascular invasion was identified. ER and PR negative, Her2 testing positive. Parveen continues to have pain in her breast.  She continues to take  hydrocodone a number of times a day.  She has been seen by Dr. Rosana Hoes, who felt that neoadjuvant chemotherapy would be required.    Initial staging studies showed evidence of liver and lung mets.   Patient also has evidence of bone lesions. Patient started neoadjuvant chemotherapy, Taxotere/Carbo/Herceptin in October 2004.   Patient had a CT scan in December 2004 which demonstrated extensive clot in the SVC  innominate vein, bilateral jugular vein and  She was started on anticoagulation therapy. She received a total of 6 cycles of Taxotere/Carbo/Herceptin, completed in April 2005."  Patient has been on  trastuzumab continued indefinitely; has also received lapatinib and capecitabine for variable intervals in 2007-2008. Most recent echo 12/01/2013 showed an ejection fraction of 55%. She is status post bilateral mastectomies with bilateral axillary lymph node dissection 12/07/2004, showing (a) on the right, a mypT1c ypN1 invasive ductal carcinoma, grade 3, estrogen and progesterone receptor negative, HER-2 positive, with an MIB-1 of 31% (b) on the left, ypT2 ypN1 invasive ductal carcinoma, grade 2, estrogen and progesterone receptor negative, HER-2 positive, with an MIB-1 of 35%. She is Status post radiation June through July of 2006, to the right chest wall, left chest wall, bilateral supraclavicular fossae, and bilateral axillary boosts; with additional radiation to the right and left chest walls and the central chest wall completed November of 2007. She is status post ixempra x9 completed August of 2009. 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 Patient has chronic pain, with negative PET scan 08/24/2013 (no evidence of active cancer). On Neurontin and Tramadol therapy.  Her subsequent history is as detailed below   PAST MEDICAL HISTORY: Past Medical History:  Diagnosis Date  . Breast cancer ([place])    mets to liver and lung  . Breast cancer metastasized to multiple sites ([place]) 02/26/2013  . History of chemotherapy Feb. 2006   taxotere/herceptin/carboplatin  . Hypertension   . Neuropathy   . Radiation 07/31/2006   left upper chest  . Radiation 06/17/2006-06/27/2006   6480 cGy bilat. chest wall  . SVC syndrome   . Thrombosis     PAST SURGICAL HISTORY: Past Surgical History:  Procedure Laterality Date  . ANKLE  SURGERY    . BACK SURGERY    . CHOLECYSTECTOMY  1989  . MASTECTOMY Bilateral   . PERIPHERALLY INSERTED CENTRAL CATHETER INSERTION    . TUBAL LIGATION  1986    FAMILY HISTORY Family History  Problem Relation Age of Onset  . Heart failure Father   . Cancer Father        Prostate cancer  . Heart failure Brother   . Cancer Brother        Prostate cancer  . Diabetes Maternal Aunt   She had three brothers, one died of gunshot wound, one of complications of diabetes mellitus and one of myocardial infarction.  She has no sisters.  Mother died of complications of brain metastasis in 72.  Father has had a myocardial infarction in 1999.  No history of breast or ovarian cancer in the family.     GYNECOLOGIC HISTORY:    No LMP recorded. Patient is postmenopausal. Menarche at age 66.   Gravida 3, para 3.   First live birth at age 38.   No history of breast feeding. No history of hormonal replacement therapy.    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 He used to work as a Art gallery manager, but is now retired and disabled. The patient has three children, Monette who lives in [place] and works as a Hydrographic surveyor, Financial risk analyst who lives in [place] and works as a Administrator, and [place] who lives in [place] and also works as a Hydrographic surveyor. The patient has 14 grandchildren and 4 great-grandchildren. She attends a [employer].   ADVANCED DIRECTIVES:  Not in place   HEALTH MAINTENANCE: (Updated 06/12/2013) Social History   Tobacco Use  . Smoking status: Never Smoker  . Smokeless tobacco: Never Used  Substance Use Topics  . Alcohol use: Yes    Comment: occasional  . Drug use: No     Colonoscopy: Never and "I don't want one"  PAP:  1987  Bone density:  Never   Lipid panel:  Not on file   Allergies  Allergen Reactions  .  Penicillins Hives    PATIENT HAS TOLERATED CEPHALOSPORINGS  . Adhesive [Tape] Other (See Comments)    Tears skin     Current Outpatient Medications  Medication Sig Dispense Refill  . acetaminophen (TYLENOL) 500 MG tablet Take 1,000 mg by mouth every 6 (six) hours as needed for mild pain or fever.     Marland Kitchen albuterol (PROVENTIL HFA;VENTOLIN HFA) 108 (90 BASE) MCG/ACT inhaler Inhale 2 puffs into the lungs every 6 (six) hours as needed for wheezing. 1 Inhaler 5  . ALPRAZolam (XANAX) 1 MG tablet Take 1 tablet (1 mg total) by mouth 3 (three) times daily as needed. for anxiety 60 tablet 1  . amLODipine (NORVASC) 10 MG tablet Take 1 tablet (10 mg total) by mouth every morning. 30 tablet 4  . baclofen (LIORESAL) 10 MG tablet TAKE 1 TABLET BY MOUTH THREE TIMES DAILY AS NEEDED FOR MUSCLE SPASMS 270 tablet 0  . carvedilol (COREG) 12.5 MG tablet Take 1 tablet (12.5 mg total) by mouth 2 (two) times daily. 60 tablet 6  . diclofenac sodium (VOLTAREN) 1 % GEL Apply 2 g topically daily as needed (for pain). Apply to knees and shoulders 100 g 6  . furosemide (LASIX) 40 MG tablet TAKE 1 TABLET(40 MG) BY MOUTH DAILY 30 tablet 3  . gabapentin (NEURONTIN) 300 MG capsule TAKE 2 CAPSULES(600 MG) BY MOUTH THREE TIMES DAILY 540 capsule 0  . losartan (COZAAR) 100 MG tablet Take 100 mg by mouth every morning.    . mupirocin ointment (BACTROBAN) 2 % APP EXT TO SORE ON FOOT TID FOR 5 DAYS    . oxyCODONE-acetaminophen (PERCOCET) 10-325 MG tablet Take 1 tablet by mouth 4 (four) times daily as needed.    . potassium chloride (K-DUR,KLOR-CON) 20 MEQ tablet Take 1 tablet (20 mEq total) by mouth daily. 30 tablet 3  . predniSONE (DELTASONE) 5 MG tablet     . spironolactone (ALDACTONE) 25 MG tablet TAKE 1 TABLET(25 MG) BY MOUTH DAILY 30 tablet 11  . temazepam (RESTORIL) 30 MG capsule TAKE ONE CAPSULE BY MOUTH EVERY NIGHT AT BEDTIME AS NEEDED FOR SLEEP 30 capsule 2  . warfarin (COUMADIN) 2.5 MG tablet Take 1 tablet (2.5 mg total) by  mouth daily. Take with 5 mg per md instructions 30 tablet 3  . warfarin (COUMADIN) 5 MG tablet Take one tablet on odd numbered days and one and a half tablets on even numbered days 90 tablet 4   No current facility-administered medications for this visit.   Facility-Administered Medications Ordered  in Other Visits  Medication Dose Route Frequency Provider Last Rate Last Admin  . sodium chloride flush (NS) 0.9 % injection 10 mL  10 mL Intravenous PRN Chastin Riesgo, Virgie Dad, MD   10 mL at 12/15/15 1200  . sodium chloride flush (NS) 0.9 % injection 10 mL  10 mL Intracatheter PRN Hanad Leino, Virgie Dad, MD   10 mL at 08/14/18 1116  . sodium chloride flush (NS) 0.9 % injection 10 mL  10 mL Intracatheter PRN Nelline Lio, Virgie Dad, MD        OBJECTIVE: African-American woman in no acute distress  Vitals:   01/28/20 0920  BP: (!) 152/81  Pulse: 66  Resp: 17  Temp: 98.3 F (36.8 C)  SpO2: 100%     Body mass index is 50.13 kg/m.    ECOG FS: 2 Filed Weights   01/28/20 0920  Weight: 283 lb (128.4 kg)    Sclerae unicteric, EOMs intact Wearing a mask No cervical or supraclavicular adenopathy Lungs no rales or rhonchi Heart regular rate and rhythm Abd soft, BS, nontender, positive bowel sounds MSK no focal spinal tenderness, no upper extremity lymphedema Neuro: nonfocal, well oriented, appropriate affect Breasts: Status post bilateral mastectomies.  No evidence of local recurrence.  Both axillae are benign.   LAB RESULTS: Lab Results  Component Value Date   WBC 8.2 01/28/2020   NEUTROABS 4.9 01/28/2020   HGB 11.6 (L) 01/28/2020   HCT 34.0 (L) 01/28/2020   MCV 84.2 01/28/2020   PLT 265 01/28/2020      Chemistry      Component Value Date/Time   NA 141 12/31/2019 0841   NA 143 08/14/2017 0811   K 4.5 12/31/2019 0841   K 3.8 08/14/2017 0811   CL 107 12/31/2019 0841   CL 104 01/30/2013 0850   CO2 25 12/31/2019 0841   CO2 24 08/14/2017 0811   BUN 41 (H) 12/31/2019 0841   BUN 14.9  08/14/2017 0811   CREATININE 1.68 (H) 12/31/2019 0841   CREATININE 0.9 08/14/2017 0811      Component Value Date/Time   CALCIUM 9.2 12/31/2019 0841   CALCIUM 9.4 08/14/2017 0811   ALKPHOS 71 12/31/2019 0841   ALKPHOS 73 08/14/2017 0811   AST 10 (L) 12/31/2019 0841   AST 10 08/14/2017 0811   ALT 11 12/31/2019 0841   ALT 10 08/14/2017 0811   BILITOT 0.5 12/31/2019 0841   BILITOT 0.62 08/14/2017 0811      STUDIES: No results found.   ASSESSMENT: 69 y.o.  [place], [place], woman  (1)  with a history of inflammatory right breast cancer metastatic at presentation September 2004 with involvement of liver and bone, HER-2 positive, estrogen and progesterone receptor negative  (2) treated with carboplatin, docetaxel and Herceptin x 6 completed April 2005  (3) trastuzumab continued indefinitely;   (a) has also received lapatinib and capecitabine for variable intervals in 2007-2008.  (b) Every 6 month echo: 09/24/2017 showed an ejection fraction in the 55-60%  (c) echocardiogram 03/07/2018 showed an ejection fraction in the 55-60%  (d) cardiogram 09/30/2018 showed ejection fraction in the 60-65% range.  (4) status post bilateral mastectomies with bilateral axillary lymph node dissection 12/07/2004, showing  (a) on the right, a mypT1c ypN1 invasive ductal carcinoma, grade 3, estrogen and progesterone receptor negative, HER-2 positive, with an MIB-1 of 31%  (b) on the left, ypT2 ypN1 invasive ductal carcinoma, grade 2, estrogen and progesterone receptor negative, HER-2 positive, with an MIB-1 of 35%.  (5)  Status  post radiation June through July of 2006, to the right chest wall, left chest wall, bilateral supraclavicular fossae, and bilateral axillary boosts; with additional radiation to the right and left chest walls and the central chest wall completed November of 2007  (6) status post Ixempra x9 completed August of 2009.  (7) history of superior vena caval syndrome, on life  long anticoagulation   (8)  History of chemotherapy-induced neuropathy.   (9)  chronic pain, with negative PET scan 08/24/2013 (no evidence of active cancer). On Neurontin and Tramadol  (a) repeat PET scan December 2016 again negative.  (b) Repeat PET scan 04/23/2017 shows no active malignancy  (c) PET scan 07/09/2018 shows no evidence of active malignancy.  (10) right upper extremity cellulitis, no bacteremia; treated with cephalexin /doxycycline for 2 weeks, with resolution  (11) subacute fracture right humerus, nondisplaced, status post fall 03/27/2017   PLAN: Kurstyn is 15 years out from definitive surgery for her breast cancer with no evidence of disease recurrence.  This is very favorable.  We do not know when to stop the trastuzumab.  There is no prospective data in that regard.  Accordingly we are continuing every 28 days as before.  She will need a repeat echocardiogram in October.  I am seeing her every 53month.  I wrote a prescription for bras and prostheses for her.  We discussed fall precautions.  At the next visit I will set her up for restaging studies late this year.  She knows to call for any other issue that may develop before the next visit  Total encounter time 25 minutes.*  Joseph Bias, GVirgie Dad MD  01/28/20 9:26 AM Medical Oncology and Hematology [facility] [place] [id number]. [phone]   Fax. [phone]  I, KWilburn Mylar am acting as scribe for Dr. GVirgie Dad Tevan Marian.  I, GLurline DelMD, have reviewed the above documentation for accuracy and completeness, and I agree with the above.   *Total Encounter Time as defined by the Centers for Medicare and Medicaid Services includes, in addition to the face-to-face time of a patient visit (documented in the note above) non-face-to-face time: obtaining and reviewing outside history, ordering and reviewing medications, tests or procedures, care coordination  (communications with other health care professionals or caregivers) and documentation in the medical record.

## 2020-01-28 NOTE — Patient Instructions (Signed)
[facility] °Discharge Instructions for Patients Receiving Chemotherapy ° °Today you received the following chemotherapy agents Trastuzumab ° °To help prevent nausea and vomiting after your treatment, we encourage you to take your nausea medication as directed. °  °If you develop nausea and vomiting that is not controlled by your nausea medication, call the clinic.  ° °BELOW ARE SYMPTOMS THAT SHOULD BE REPORTED IMMEDIATELY: °· *FEVER GREATER THAN 100.5 F °· *CHILLS WITH OR WITHOUT FEVER °· NAUSEA AND VOMITING THAT IS NOT CONTROLLED WITH YOUR NAUSEA MEDICATION °· *UNUSUAL SHORTNESS OF BREATH °· *UNUSUAL BRUISING OR BLEEDING °· TENDERNESS IN MOUTH AND THROAT WITH OR WITHOUT PRESENCE OF ULCERS °· *URINARY PROBLEMS °· *BOWEL PROBLEMS °· UNUSUAL RASH °Items with * indicate a potential emergency and should be followed up as soon as possible. ° °Feel free to call the clinic should you have any questions or concerns. The clinic phone number is [phone]. ° °Please show the CHEMO ALERT CARD at check-in to the Emergency Department and triage nurse. ° ° °

## 2020-01-29 ENCOUNTER — Telehealth: Payer: Self-pay | Admitting: Oncology

## 2020-01-29 NOTE — Telephone Encounter (Signed)
Scheduled appts per 6/3 LOS. Left voicemail with next appt date and times.

## 2020-02-25 ENCOUNTER — Inpatient Hospital Stay: Payer: Medicare Other

## 2020-02-25 ENCOUNTER — Other Ambulatory Visit: Payer: Self-pay | Admitting: Oncology

## 2020-02-25 ENCOUNTER — Other Ambulatory Visit: Payer: Self-pay

## 2020-02-25 ENCOUNTER — Inpatient Hospital Stay: Payer: Medicare Other | Attending: Oncology

## 2020-02-25 ENCOUNTER — Other Ambulatory Visit: Payer: Self-pay | Admitting: Hematology and Oncology

## 2020-02-25 ENCOUNTER — Ambulatory Visit: Payer: Medicare Other

## 2020-02-25 ENCOUNTER — Other Ambulatory Visit: Payer: Medicare Other

## 2020-02-25 VITALS — BP 146/71 | HR 73 | Temp 99.3°F | Resp 18 | Wt 273.2 lb

## 2020-02-25 DIAGNOSIS — Z5112 Encounter for antineoplastic immunotherapy: Secondary | ICD-10-CM | POA: Insufficient documentation

## 2020-02-25 DIAGNOSIS — C50812 Malignant neoplasm of overlapping sites of left female breast: Secondary | ICD-10-CM | POA: Insufficient documentation

## 2020-02-25 DIAGNOSIS — D689 Coagulation defect, unspecified: Secondary | ICD-10-CM | POA: Diagnosis not present

## 2020-02-25 DIAGNOSIS — C50919 Malignant neoplasm of unspecified site of unspecified female breast: Secondary | ICD-10-CM

## 2020-02-25 DIAGNOSIS — C50911 Malignant neoplasm of unspecified site of right female breast: Secondary | ICD-10-CM | POA: Insufficient documentation

## 2020-02-25 LAB — CMP (CANCER CENTER ONLY)
ALT: 13 U/L (ref 0–44)
AST: 11 U/L — ABNORMAL LOW (ref 15–41)
Albumin: 3.7 g/dL (ref 3.5–5.0)
Alkaline Phosphatase: 75 U/L (ref 38–126)
Anion gap: 10 (ref 5–15)
BUN: 28 mg/dL — ABNORMAL HIGH (ref 8–23)
CO2: 29 mmol/L (ref 22–32)
Calcium: 9.6 mg/dL (ref 8.9–10.3)
Chloride: 103 mmol/L (ref 98–111)
Creatinine: 1.04 mg/dL — ABNORMAL HIGH (ref 0.44–1.00)
GFR, Est AFR Am: >60 mL/min (ref >60)
GFR, Estimated: 55 mL/min — ABNORMAL LOW (ref >60)
Glucose, Bld: 100 mg/dL — ABNORMAL HIGH (ref 70–99)
Potassium: 4.2 mmol/L (ref 3.5–5.1)
Sodium: 142 mmol/L (ref 135–145)
Total Bilirubin: 0.7 mg/dL (ref 0.3–1.2)
Total Protein: 7.8 g/dL (ref 6.5–8.1)

## 2020-02-25 LAB — CBC WITH DIFFERENTIAL (CANCER CENTER ONLY)
Abs Immature Granulocytes: 0.04 10*3/uL (ref 0.00–0.07)
Basophils Absolute: 0 10*3/uL (ref 0.0–0.1)
Basophils Relative: 0 %
Eosinophils Absolute: 0.1 10*3/uL (ref 0.0–0.5)
Eosinophils Relative: 1 %
HCT: 36.4 % (ref 36.0–46.0)
Hemoglobin: 12.4 g/dL (ref 12.0–15.0)
Immature Granulocytes: 0 %
Lymphocytes Relative: 13 %
Lymphs Abs: 1.3 10*3/uL (ref 0.7–4.0)
MCH: 28.4 pg (ref 26.0–34.0)
MCHC: 34.1 g/dL (ref 30.0–36.0)
MCV: 83.5 fL (ref 80.0–100.0)
Monocytes Absolute: 0.5 10*3/uL (ref 0.1–1.0)
Monocytes Relative: 5 %
Neutro Abs: 8.3 10*3/uL — ABNORMAL HIGH (ref 1.7–7.7)
Neutrophils Relative %: 81 %
Platelet Count: 250 10*3/uL (ref 150–400)
RBC: 4.36 MIL/uL (ref 3.87–5.11)
RDW: 15.2 % (ref 11.5–15.5)
WBC Count: 10.2 10*3/uL (ref 4.0–10.5)
nRBC: 0 % (ref 0.0–0.2)

## 2020-02-25 LAB — PROTIME-INR
INR: 1.2 (ref 0.8–1.2)
Prothrombin Time: 14.7 seconds (ref 11.4–15.2)

## 2020-02-25 MED ORDER — HEPARIN SOD (PORK) LOCK FLUSH 100 UNIT/ML IV SOLN
500.0000 [IU] | Freq: Once | INTRAVENOUS | Status: AC | PRN
  Administered 2020-02-25: 500 [IU]
  Filled 2020-02-25: qty 5

## 2020-02-25 MED ORDER — ACETAMINOPHEN 325 MG PO TABS
650.0000 mg | ORAL_TABLET | Freq: Once | ORAL | Status: AC
  Administered 2020-02-25: 650 mg via ORAL

## 2020-02-25 MED ORDER — TRASTUZUMAB-DKST CHEMO 150 MG IV SOLR
750.0000 mg | Freq: Once | INTRAVENOUS | Status: AC
  Administered 2020-02-25: 750 mg via INTRAVENOUS
  Filled 2020-02-25: qty 35.72

## 2020-02-25 MED ORDER — SODIUM CHLORIDE 0.9 % IV SOLN
Freq: Once | INTRAVENOUS | Status: AC
  Filled 2020-02-25: qty 250

## 2020-02-25 MED ORDER — DIPHENHYDRAMINE HCL 25 MG PO CAPS
ORAL_CAPSULE | ORAL | Status: AC
  Filled 2020-02-25: qty 1

## 2020-02-25 MED ORDER — LORAZEPAM 2 MG/ML IJ SOLN
1.0000 mg | Freq: Once | INTRAMUSCULAR | Status: AC | PRN
  Administered 2020-02-25: 1 mg via INTRAVENOUS

## 2020-02-25 MED ORDER — DIPHENHYDRAMINE HCL 25 MG PO CAPS
25.0000 mg | ORAL_CAPSULE | Freq: Once | ORAL | Status: AC
  Administered 2020-02-25: 25 mg via ORAL

## 2020-02-25 MED ORDER — LORAZEPAM 1 MG PO TABS
ORAL_TABLET | ORAL | Status: AC
  Filled 2020-02-25: qty 1

## 2020-02-25 MED ORDER — ACETAMINOPHEN 325 MG PO TABS
ORAL_TABLET | ORAL | Status: AC
  Filled 2020-02-25: qty 2

## 2020-02-25 MED ORDER — LORAZEPAM 2 MG/ML IJ SOLN
INTRAMUSCULAR | Status: AC
  Filled 2020-02-25: qty 1

## 2020-02-25 MED ORDER — SODIUM CHLORIDE 0.9% FLUSH
10.0000 mL | INTRAVENOUS | Status: DC | PRN
  Administered 2020-02-25: 10 mL
  Filled 2020-02-25: qty 10

## 2020-02-25 NOTE — Patient Instructions (Signed)
[facility] °Discharge Instructions for Patients Receiving Chemotherapy ° °Today you received the following chemotherapy agents Trastuzumab ° °To help prevent nausea and vomiting after your treatment, we encourage you to take your nausea medication as directed. °  °If you develop nausea and vomiting that is not controlled by your nausea medication, call the clinic.  ° °BELOW ARE SYMPTOMS THAT SHOULD BE REPORTED IMMEDIATELY: °· *FEVER GREATER THAN 100.5 F °· *CHILLS WITH OR WITHOUT FEVER °· NAUSEA AND VOMITING THAT IS NOT CONTROLLED WITH YOUR NAUSEA MEDICATION °· *UNUSUAL SHORTNESS OF BREATH °· *UNUSUAL BRUISING OR BLEEDING °· TENDERNESS IN MOUTH AND THROAT WITH OR WITHOUT PRESENCE OF ULCERS °· *URINARY PROBLEMS °· *BOWEL PROBLEMS °· UNUSUAL RASH °Items with * indicate a potential emergency and should be followed up as soon as possible. ° °Feel free to call the clinic should you have any questions or concerns. The clinic phone number is [phone]. ° °Please show the CHEMO ALERT CARD at check-in to the Emergency Department and triage nurse. ° ° °

## 2020-02-25 NOTE — Addendum Note (Signed)
Addended by: Nicholas Lose on: 02/25/2020 09:16 AM   Modules accepted: Orders

## 2020-03-07 ENCOUNTER — Telehealth: Payer: Self-pay | Admitting: *Deleted

## 2020-03-07 NOTE — Telephone Encounter (Signed)
LM to call Dr Magrinat's nurse.  

## 2020-03-07 NOTE — Telephone Encounter (Signed)
-----   Message from Patton Salles, RN sent at 03/07/2020  3:09 PM EDT ----- I will call her.  ----- Message ----- From: Gardiner Rhyme, RN Sent: 03/07/2020   2:43 PM EDT To: [facility]  Bonetta Mostek or Mickel Baas,  Will you see if this was resolved?  Val is on PAL and I see no notes.  Thanks!  Melanie ----- Message ----- From: Gardenia Phlegm, NP Sent: 02/26/2020  12:56 PM EDT To: Laureen Abrahams, RN  INR is low.  Will you check on her coumadin? ----- Message ----- From: Interface, Lab In [place] Sent: 02/25/2020   8:42 AM EDT To: Chauncey Cruel, MD

## 2020-03-15 DIAGNOSIS — M170 Bilateral primary osteoarthritis of knee: Secondary | ICD-10-CM | POA: Diagnosis not present

## 2020-03-15 DIAGNOSIS — I10 Essential (primary) hypertension: Secondary | ICD-10-CM | POA: Diagnosis not present

## 2020-03-15 DIAGNOSIS — Z6841 Body Mass Index (BMI) 40.0 and over, adult: Secondary | ICD-10-CM | POA: Diagnosis not present

## 2020-03-21 ENCOUNTER — Telehealth: Payer: Self-pay | Admitting: Oncology

## 2020-03-21 NOTE — Telephone Encounter (Signed)
Rescheduled per 7/26 staff message due to overbook in infusion per RN Amy and Delle Reining. Unable to reach pt. Left voicemail with appt times and date.

## 2020-03-24 ENCOUNTER — Inpatient Hospital Stay: Payer: Medicare Other

## 2020-03-24 ENCOUNTER — Ambulatory Visit: Payer: Medicare Other

## 2020-03-24 ENCOUNTER — Other Ambulatory Visit: Payer: Self-pay

## 2020-03-24 ENCOUNTER — Other Ambulatory Visit: Payer: Medicare Other

## 2020-03-24 VITALS — BP 134/87 | HR 70 | Temp 98.8°F | Resp 18

## 2020-03-24 DIAGNOSIS — C50919 Malignant neoplasm of unspecified site of unspecified female breast: Secondary | ICD-10-CM

## 2020-03-24 DIAGNOSIS — D689 Coagulation defect, unspecified: Secondary | ICD-10-CM | POA: Diagnosis not present

## 2020-03-24 DIAGNOSIS — C50911 Malignant neoplasm of unspecified site of right female breast: Secondary | ICD-10-CM | POA: Diagnosis not present

## 2020-03-24 DIAGNOSIS — Z95828 Presence of other vascular implants and grafts: Secondary | ICD-10-CM

## 2020-03-24 DIAGNOSIS — Z5112 Encounter for antineoplastic immunotherapy: Secondary | ICD-10-CM | POA: Diagnosis not present

## 2020-03-24 DIAGNOSIS — C50812 Malignant neoplasm of overlapping sites of left female breast: Secondary | ICD-10-CM | POA: Diagnosis not present

## 2020-03-24 LAB — CBC WITH DIFFERENTIAL (CANCER CENTER ONLY)
Abs Immature Granulocytes: 0.03 10*3/uL (ref 0.00–0.07)
Basophils Absolute: 0 10*3/uL (ref 0.0–0.1)
Basophils Relative: 0 %
Eosinophils Absolute: 0 10*3/uL (ref 0.0–0.5)
Eosinophils Relative: 1 %
HCT: 35.8 % — ABNORMAL LOW (ref 36.0–46.0)
Hemoglobin: 12.2 g/dL (ref 12.0–15.0)
Immature Granulocytes: 0 %
Lymphocytes Relative: 17 %
Lymphs Abs: 1.3 10*3/uL (ref 0.7–4.0)
MCH: 28.4 pg (ref 26.0–34.0)
MCHC: 34.1 g/dL (ref 30.0–36.0)
MCV: 83.3 fL (ref 80.0–100.0)
Monocytes Absolute: 0.4 10*3/uL (ref 0.1–1.0)
Monocytes Relative: 5 %
Neutro Abs: 6.2 10*3/uL (ref 1.7–7.7)
Neutrophils Relative %: 77 %
Platelet Count: 263 10*3/uL (ref 150–400)
RBC: 4.3 MIL/uL (ref 3.87–5.11)
RDW: 15.6 % — ABNORMAL HIGH (ref 11.5–15.5)
WBC Count: 8 10*3/uL (ref 4.0–10.5)
nRBC: 0 % (ref 0.0–0.2)

## 2020-03-24 LAB — CMP (CANCER CENTER ONLY)
ALT: 11 U/L (ref 0–44)
AST: 12 U/L — ABNORMAL LOW (ref 15–41)
Albumin: 3.6 g/dL (ref 3.5–5.0)
Alkaline Phosphatase: 77 U/L (ref 38–126)
Anion gap: 10 (ref 5–15)
BUN: 22 mg/dL (ref 8–23)
CO2: 25 mmol/L (ref 22–32)
Calcium: 9.9 mg/dL (ref 8.9–10.3)
Chloride: 108 mmol/L (ref 98–111)
Creatinine: 0.96 mg/dL (ref 0.44–1.00)
GFR, Est AFR Am: >60 mL/min (ref >60)
GFR, Estimated: >60 mL/min (ref >60)
Glucose, Bld: 102 mg/dL — ABNORMAL HIGH (ref 70–99)
Potassium: 4.3 mmol/L (ref 3.5–5.1)
Sodium: 143 mmol/L (ref 135–145)
Total Bilirubin: 0.6 mg/dL (ref 0.3–1.2)
Total Protein: 7.5 g/dL (ref 6.5–8.1)

## 2020-03-24 MED ORDER — TRASTUZUMAB-DKST CHEMO 150 MG IV SOLR
750.0000 mg | Freq: Once | INTRAVENOUS | Status: AC
  Administered 2020-03-24: 750 mg via INTRAVENOUS
  Filled 2020-03-24: qty 35.72

## 2020-03-24 MED ORDER — SODIUM CHLORIDE 0.9% FLUSH
10.0000 mL | INTRAVENOUS | Status: DC | PRN
  Administered 2020-03-24: 10 mL via INTRAVENOUS
  Filled 2020-03-24: qty 10

## 2020-03-24 MED ORDER — DIPHENHYDRAMINE HCL 25 MG PO CAPS
25.0000 mg | ORAL_CAPSULE | Freq: Once | ORAL | Status: AC
  Administered 2020-03-24: 25 mg via ORAL

## 2020-03-24 MED ORDER — LORAZEPAM 2 MG/ML IJ SOLN
1.0000 mg | Freq: Once | INTRAMUSCULAR | Status: AC | PRN
  Administered 2020-03-24: 1 mg via INTRAVENOUS

## 2020-03-24 MED ORDER — ACETAMINOPHEN 325 MG PO TABS
ORAL_TABLET | ORAL | Status: AC
  Filled 2020-03-24: qty 2

## 2020-03-24 MED ORDER — ACETAMINOPHEN 325 MG PO TABS
650.0000 mg | ORAL_TABLET | Freq: Once | ORAL | Status: AC
  Administered 2020-03-24: 650 mg via ORAL

## 2020-03-24 MED ORDER — SODIUM CHLORIDE 0.9 % IV SOLN
Freq: Once | INTRAVENOUS | Status: AC
  Filled 2020-03-24: qty 250

## 2020-03-24 MED ORDER — LORAZEPAM 2 MG/ML IJ SOLN
INTRAMUSCULAR | Status: AC
  Filled 2020-03-24: qty 1

## 2020-03-24 MED ORDER — SODIUM CHLORIDE 0.9% FLUSH
10.0000 mL | INTRAVENOUS | Status: DC | PRN
  Administered 2020-03-24: 10 mL
  Filled 2020-03-24: qty 10

## 2020-03-24 MED ORDER — DIPHENHYDRAMINE HCL 25 MG PO CAPS
ORAL_CAPSULE | ORAL | Status: AC
  Filled 2020-03-24: qty 1

## 2020-03-24 MED ORDER — HEPARIN SOD (PORK) LOCK FLUSH 100 UNIT/ML IV SOLN
500.0000 [IU] | Freq: Once | INTRAVENOUS | Status: AC | PRN
  Administered 2020-03-24: 500 [IU]
  Filled 2020-03-24: qty 5

## 2020-03-24 NOTE — Patient Instructions (Signed)
[facility] °Discharge Instructions for Patients Receiving Chemotherapy ° °Today you received the following chemotherapy agents Trastuzumab ° °To help prevent nausea and vomiting after your treatment, we encourage you to take your nausea medication as directed. °  °If you develop nausea and vomiting that is not controlled by your nausea medication, call the clinic.  ° °BELOW ARE SYMPTOMS THAT SHOULD BE REPORTED IMMEDIATELY: °· *FEVER GREATER THAN 100.5 F °· *CHILLS WITH OR WITHOUT FEVER °· NAUSEA AND VOMITING THAT IS NOT CONTROLLED WITH YOUR NAUSEA MEDICATION °· *UNUSUAL SHORTNESS OF BREATH °· *UNUSUAL BRUISING OR BLEEDING °· TENDERNESS IN MOUTH AND THROAT WITH OR WITHOUT PRESENCE OF ULCERS °· *URINARY PROBLEMS °· *BOWEL PROBLEMS °· UNUSUAL RASH °Items with * indicate a potential emergency and should be followed up as soon as possible. ° °Feel free to call the clinic should you have any questions or concerns. The clinic phone number is [phone]. ° °Please show the CHEMO ALERT CARD at check-in to the Emergency Department and triage nurse. ° ° °

## 2020-03-28 ENCOUNTER — Other Ambulatory Visit: Payer: Self-pay | Admitting: *Deleted

## 2020-03-28 ENCOUNTER — Other Ambulatory Visit: Payer: Self-pay | Admitting: Oncology

## 2020-03-28 DIAGNOSIS — C50919 Malignant neoplasm of unspecified site of unspecified female breast: Secondary | ICD-10-CM

## 2020-03-28 MED ORDER — ALPRAZOLAM 1 MG PO TABS: 1.0000 mg | ORAL_TABLET | Freq: Three times a day (TID) | ORAL | 1 refills | Status: DC | PRN

## 2020-03-28 MED ORDER — TEMAZEPAM 30 MG PO CAPS: ORAL_CAPSULE | ORAL | 2 refills | Status: DC

## 2020-03-28 NOTE — Telephone Encounter (Signed)
Prescription refill

## 2020-04-15 ENCOUNTER — Other Ambulatory Visit: Payer: Self-pay | Admitting: *Deleted

## 2020-04-15 ENCOUNTER — Telehealth: Payer: Self-pay | Admitting: *Deleted

## 2020-04-15 ENCOUNTER — Telehealth: Payer: Self-pay | Admitting: Adult Health

## 2020-04-15 NOTE — Telephone Encounter (Signed)
This RN spoke with pt per call- stating she has tested positive on 04/08/2020 for Covid.She states she went to a wedding reception and was alerted that another attendee tested positive - she developed nasal allergy symptoms and thought it would be best to be tested- which showed positive .  Her husband and daughter are also positive- all fairing well at this time.  Note pt has received vaccination and is having mild symptoms of nasal drainage with fatigue her primary symptom.  She is scheduled for treatment on 04/21/2020 and wanted to alert this office.  This RN informed her office policy no visit for 21 days post positive test.  Appointment for 04/21/2020 canceled.  Pt is scheduled presently 9/22 for next infusion and if not necessary she would prefer to skip this infusion and proceed with treatment as scheduled.

## 2020-04-15 NOTE — Telephone Encounter (Signed)
Called and talked to patient about covid positivity and whether or not she qualifies for monoclonal antibody treatment.  She is asymptomatic and does not qualify due to not having any symptoms.  Reinforced quarantine for 14 days as she has no symptoms.    Wilber Bihari, NP

## 2020-04-21 ENCOUNTER — Other Ambulatory Visit: Payer: Medicare Other

## 2020-04-21 ENCOUNTER — Ambulatory Visit: Payer: Medicare Other

## 2020-05-03 DIAGNOSIS — Z01818 Encounter for other preprocedural examination: Secondary | ICD-10-CM | POA: Diagnosis not present

## 2020-05-03 DIAGNOSIS — H25812 Combined forms of age-related cataract, left eye: Secondary | ICD-10-CM | POA: Diagnosis not present

## 2020-05-03 DIAGNOSIS — H35373 Puckering of macula, bilateral: Secondary | ICD-10-CM | POA: Diagnosis not present

## 2020-05-17 NOTE — Progress Notes (Signed)
[name]  [phone] [phone]    ID: [name]   DOB: Sep 27, 1950  MR#: [id number]  LGX#:[id number]   Patient Care Team: Ernestene Kiel, MD as PCP - General (Internal Medicine) Haze Rushing., MD as Consulting Physician (Pain Medicine) Joya Salm, MD as Referring Physician (Orthopedic Surgery) Jaymes Graff, DO as Consulting Physician (Orthopedic Surgery) Larey Dresser, MD as Consulting Physician (Cardiology) Bensimhon, Shaune Pascal, MD as Consulting Physician (Cardiology) Lorelle Gibbs, MD (Radiology) Tommy Medal, Lavell Islam, MD as Consulting Physician (Infectious Diseases) Gery Pray, MD as Consulting Physician (Radiation Oncology) Rashanda Magloire, Virgie Dad, MD as Consulting Physician (Oncology)   CHIEF COMPLAINT: Metastatic HER-2 positive, estrogen receptor negative breast cancer (s/p bilateral mastectomies)  CURRENT TREATMENT: Trastuzumab every 28 days; lifelong warfarin   INTERVAL HISTORY: [name] returns today for follow-up and treatment of her estrogen receptor negative, but HER2 amplified breast cancer.   She continues on trastuzumab, which she receives every 28 days.  She has absolutely no side effects related to this that she is aware of.   She is on lifelong warfarin. She tolerates this well and without any noticeable side effects.   REVIEW OF SYSTEMS: [name] received both doses of the Moderna vaccine, but then she was exposed to Covid and actually developed a mild infection.  Her husband and daughter also were infected.  Low fat did not receive the antibodies infusion.  She had some alteration in smell and taste and a little bit of a headache and cough.  Although symptoms have completely resolved.  She has had no bleeding complications.  A detailed review of systems today was otherwise stable   BREAST CANCER HISTORY:   From the earlier summary:  [name] is 69 years old [name] female.  This [name] has  been in good health all of her life.  She noted a swelling and discomfort in her right breast in June 2004.  She was seen in the Emergency Room in [place] and was treated for mastitis.  She was treated for a number of months with mastitis and the swelling did not get better. She was given hydrocodone and Cipro.  Finally, the swelling did get better and ultimately the nipple became retracted and she noticed some dimpling in her skin. She had a mammogram in July of 2004 in [place] with subsequent mammogram on May 27, 2003, by Dr. Isaiah Blakes.  Mammogram done on September 30 showed marked increased density in the left breast.  Biopsy was performed the same day.  It was noted at the 12 o'clock position, deep in the breast was a focal hypoechoic mass, at least 3.5 cm in diameter.  Biopsy did in fact show invasive in situ mammary carcinoma. This was felt to be both at least intermediate, high grade.  No definite lymphovascular invasion was identified. ER and PR negative, Her2 testing positive. [name] continues to have pain in her breast.  She continues to take hydrocodone a number of times a day.  She has been seen by Dr. Rosana Hoes, who felt that neoadjuvant chemotherapy would be required.    Initial staging studies showed evidence of liver and lung mets.   Patient also has evidence of bone lesions. Patient started neoadjuvant chemotherapy, Taxotere/Carbo/Herceptin in October 2004.   Patient had a CT scan in December 2004 which demonstrated extensive clot in the SVC innominate vein, bilateral jugular vein and  She was started on anticoagulation therapy. She received a total of 6 cycles of Taxotere/Carbo/Herceptin, completed in  April 2005."  Patient has been on  trastuzumab continued indefinitely; has also received lapatinib and capecitabine for variable intervals in 2007-2008. Most recent echo 12/01/2013 showed an ejection fraction of 55%. She is status post bilateral mastectomies with bilateral axillary lymph node  dissection 12/07/2004, showing (a) on the right, a mypT1c ypN1 invasive ductal carcinoma, grade 3, estrogen and progesterone receptor negative, HER-2 positive, with an MIB-1 of 31% (b) on the left, ypT2 ypN1 invasive ductal carcinoma, grade 2, estrogen and progesterone receptor negative, HER-2 positive, with an MIB-1 of 35%. She is Status post radiation June through July of 2006, to the right chest wall, left chest wall, bilateral supraclavicular fossae, and bilateral axillary boosts; with additional radiation to the right and left chest walls and the central chest wall completed November of 2007. She is status post ixempra x9 completed August of 2009. 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 Patient has chronic pain, with negative PET scan 08/24/2013 (no evidence of active cancer). On Neurontin and Tramadol therapy.  Her subsequent history is as detailed below   PAST MEDICAL HISTORY: Past Medical History:  Diagnosis Date  . Breast cancer ([place])    mets to liver and lung  . Breast cancer metastasized to multiple sites ([place]) 02/26/2013  . History of chemotherapy Feb. 2006   taxotere/herceptin/carboplatin  . Hypertension   . Neuropathy   . Radiation 07/31/2006   left upper chest  . Radiation 06/17/2006-06/27/2006   6480 cGy bilat. chest wall  . SVC syndrome   . Thrombosis     PAST SURGICAL HISTORY: Past Surgical History:  Procedure Laterality Date  . ANKLE SURGERY    . BACK SURGERY    . CHOLECYSTECTOMY  1989  . MASTECTOMY Bilateral   . PERIPHERALLY INSERTED CENTRAL CATHETER INSERTION    . TUBAL LIGATION  1986    FAMILY HISTORY Family History  Problem Relation Age of Onset  . Heart failure Father   . Cancer Father        Prostate cancer  . Heart failure Brother   . Cancer Brother        Prostate cancer  . Diabetes Maternal Aunt   She had three brothers, one died of gunshot wound, one of  complications of diabetes mellitus and one of myocardial infarction.  She has no sisters.  Mother died of complications of brain metastasis in 18.  Father has had a myocardial infarction in 1999.  No history of breast or ovarian cancer in the family.     GYNECOLOGIC HISTORY:    No LMP recorded. Patient is postmenopausal. Menarche at age 57.   Gravida 3, para 3.   First live birth at age 19.   No history of breast feeding. No history of hormonal replacement therapy.    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 He used to work as a Art gallery manager, but is now retired and disabled. The patient has three children, Monette who lives in [place] and works as a Hydrographic surveyor, Financial risk analyst who lives in [place] and works as a Administrator, and [place] Star who lives in [place] and also works as a Hydrographic surveyor. The patient has 14 grandchildren and 4 great-grandchildren. She attends a [employer].   ADVANCED DIRECTIVES:  Not in place   HEALTH MAINTENANCE: (Updated 06/12/2013)  Social History   Tobacco Use  . Smoking status: Never Smoker  . Smokeless tobacco: Never Used  Substance Use Topics  . Alcohol use: Yes    Comment: occasional  . Drug use: No     Colonoscopy: Never and "I don't want one"  PAP:  1987  Bone density:  Never   Lipid panel:  Not on file   Allergies  Allergen Reactions  . Penicillins Hives    PATIENT HAS TOLERATED CEPHALOSPORINGS  . Adhesive [Tape] Other (See Comments)    Tears skin     Current Outpatient Medications  Medication Sig Dispense Refill  . acetaminophen (TYLENOL) 500 MG tablet Take 1,000 mg by mouth every 6 (six) hours as needed for mild pain or fever.     Marland Kitchen albuterol (PROVENTIL HFA;VENTOLIN HFA) 108 (90 BASE) MCG/ACT inhaler Inhale 2 puffs into the lungs every 6 (six) hours as needed for wheezing. 1  Inhaler 5  . ALPRAZolam (XANAX) 1 MG tablet Take 1 tablet (1 mg total) by mouth 3 (three) times daily as needed. for anxiety 60 tablet 1  . amLODipine (NORVASC) 10 MG tablet Take 1 tablet (10 mg total) by mouth every morning. 30 tablet 4  . baclofen (LIORESAL) 10 MG tablet TAKE 1 TABLET BY MOUTH THREE TIMES DAILY AS NEEDED FOR MUSCLE SPASMS 270 tablet 0  . carvedilol (COREG) 12.5 MG tablet Take 1 tablet (12.5 mg total) by mouth 2 (two) times daily. 60 tablet 6  . diclofenac sodium (VOLTAREN) 1 % GEL Apply 2 g topically daily as needed (for pain). Apply to knees and shoulders 100 g 6  . furosemide (LASIX) 40 MG tablet TAKE 1 TABLET(40 MG) BY MOUTH DAILY 30 tablet 3  . gabapentin (NEURONTIN) 300 MG capsule TAKE 2 CAPSULES(600 MG) BY MOUTH THREE TIMES DAILY 540 capsule 0  . losartan (COZAAR) 100 MG tablet Take 100 mg by mouth every morning.    . mupirocin ointment (BACTROBAN) 2 % APP EXT TO SORE ON FOOT TID FOR 5 DAYS    . oxyCODONE-acetaminophen (PERCOCET) 10-325 MG tablet Take 1 tablet by mouth 4 (four) times daily as needed.    . potassium chloride (K-DUR,KLOR-CON) 20 MEQ tablet Take 1 tablet (20 mEq total) by mouth daily. 30 tablet 3  . predniSONE (DELTASONE) 5 MG tablet     . spironolactone (ALDACTONE) 25 MG tablet TAKE 1 TABLET(25 MG) BY MOUTH DAILY 30 tablet 11  . temazepam (RESTORIL) 30 MG capsule TAKE ONE CAPSULE BY MOUTH EVERY NIGHT AT BEDTIME AS NEEDED FOR SLEEP 30 capsule 2  . warfarin (COUMADIN) 5 MG tablet TAKE 1 TABLET BY MOUTH ON ODD NUMBERED DAYS PLUS 1 AND 1/2 TABLETS ON EVEN NUMBERED DAYS 90 tablet 4   No current facility-administered medications for this visit.   Facility-Administered Medications Ordered in Other Visits  Medication Dose Route Frequency Provider Last Rate Last Admin  . sodium chloride flush (NS) 0.9 % injection 10 mL  10 mL Intravenous PRN Jaidev Sanger, Virgie Dad, MD   10 mL at 12/15/15 1200  . sodium chloride flush (NS) 0.9 % injection 10 mL  10 mL Intracatheter PRN  Axelle Szwed, Virgie Dad, MD   10 mL at 08/14/18 1116  . sodium chloride flush (NS) 0.9 % injection 10 mL  10 mL Intracatheter PRN Ambry Dix, Virgie Dad, MD        OBJECTIVE: African-American [name] who appears well  Vitals:   05/18/20 0908  BP: (!) 170/98  Pulse: (!) 58  Resp: 18  Temp:  97.6 F (36.4 C)  SpO2: 96%     Body mass index is 48.4 kg/m.    ECOG FS: 2 Filed Weights   05/18/20 0908  Weight: 273 lb 3.2 oz (123.9 kg)    Sclerae unicteric, EOMs intact Wearing a mask No cervical or supraclavicular adenopathy Lungs no rales or rhonchi Heart regular rate and rhythm Abd soft, obese, nontender, positive bowel sounds MSK no focal spinal tenderness, no upper extremity lymphedema Neuro: nonfocal, well oriented, appropriate affect Breasts: Status post bilateral mastectomies.  No evidence of chest wall recurrence   LAB RESULTS: Lab Results  Component Value Date   WBC 7.8 05/18/2020   NEUTROABS 4.9 05/18/2020   HGB 11.4 (L) 05/18/2020   HCT 33.5 (L) 05/18/2020   MCV 83.5 05/18/2020   PLT 230 05/18/2020      Chemistry      Component Value Date/Time   NA 139 05/18/2020 0846   NA 143 08/14/2017 0811   K 4.5 05/18/2020 0846   K 3.8 08/14/2017 0811   CL 106 05/18/2020 0846   CL 104 01/30/2013 0850   CO2 30 05/18/2020 0846   CO2 24 08/14/2017 0811   BUN 23 05/18/2020 0846   BUN 14.9 08/14/2017 0811   CREATININE 0.87 05/18/2020 0846   CREATININE 0.9 08/14/2017 0811      Component Value Date/Time   CALCIUM 9.3 05/18/2020 0846   CALCIUM 9.4 08/14/2017 0811   ALKPHOS 53 05/18/2020 0846   ALKPHOS 73 08/14/2017 0811   AST 10 (L) 05/18/2020 0846   AST 10 08/14/2017 0811   ALT 9 05/18/2020 0846   ALT 10 08/14/2017 0811   BILITOT 0.6 05/18/2020 0846   BILITOT 0.62 08/14/2017 0811      STUDIES: No results found.   ASSESSMENT: 69 y.o.  [name], [name], [name]  (1)  with a history of inflammatory right breast cancer metastatic at presentation September 2004 with  involvement of liver and bone, HER-2 positive, estrogen and progesterone receptor negative  (2) treated with carboplatin, docetaxel and Herceptin x 6 completed April 2005  (3) trastuzumab continued indefinitely;   (a) has also received lapatinib and capecitabine for variable intervals in 2007-2008.  (b) Every 6 month echo: 09/24/2017 showed an ejection fraction in the 55-60%  (c) echocardiogram 03/07/2018 showed an ejection fraction in the 55-60%  (d) cardiogram 09/30/2018 showed ejection fraction in the 60-65% range.  (e) echocardiogram November 30, 2019 showed an ejection fraction in the 55-60% range.  (4) status post bilateral mastectomies with bilateral axillary lymph node dissection 12/07/2004, showing  (a) on the right, a mypT1c ypN1 invasive ductal carcinoma, grade 3, estrogen and progesterone receptor negative, HER-2 positive, with an MIB-1 of 31%  (b) on the left, ypT2 ypN1 invasive ductal carcinoma, grade 2, estrogen and progesterone receptor negative, HER-2 positive, with an MIB-1 of 35%.  (5)  Status post radiation June through July of 2006, to the right chest wall, left chest wall, bilateral supraclavicular fossae, and bilateral axillary boosts; with additional radiation to the right and left chest walls and the central chest wall completed November of 2007  (6) status post Ixempra x9 completed August of 2009.  (7) history of superior vena caval syndrome, on life long anticoagulation   (8)  History of chemotherapy-induced neuropathy.   (9)  chronic pain, with negative PET scan 08/24/2013 (no evidence of active cancer). On Neurontin and Tramadol  (a) repeat PET scan December 2016 again negative.  (b) Repeat PET scan 04/23/2017 shows no active  malignancy  (c) PET scan 07/09/2018 shows no evidence of active malignancy.  (10) right upper extremity cellulitis, no bacteremia; treated with cephalexin /doxycycline for 2 weeks, with resolution  (11) subacute fracture right humerus,  nondisplaced, status post fall 03/27/2017   PLAN: Niaomi is now 15-1/2 years out from definitive surgery for her metastatic breast cancer.  There is no evidence of disease activity and she continues to tolerate her treatment remarkably well.  She had her most recent echocardiogram in April.  Likely she will have another one in October.  We will provide her with a flu shot at the next visit as we do not have them available today  She will see me again with the December trastuzumab dose.  Before that visit she will have a restaging CT scan of the chest  She knows to call for any other issue that may develop before the next visit  Total encounter time 25 minutes.  Tilia Faso, Virgie Dad, MD  05/18/20 9:42 AM Medical Oncology and Hematology [facility] [place], [place] [address] Tel. [phone]    Fax. [phone]   I, Wilburn Mylar, am acting as scribe for Dr. Virgie Dad. Aidyn Kellis.  I, Lurline Del MD, have reviewed the above documentation for accuracy and completeness, and I agree with the above.   *Total Encounter Time as defined by the Centers for Medicare and Medicaid Services includes, in addition to the face-to-face time of a patient visit (documented in the note above) non-face-to-face time: obtaining and reviewing outside history, ordering and reviewing medications, tests or procedures, care coordination (communications with other health care professionals or caregivers) and documentation in the medical record.

## 2020-05-18 ENCOUNTER — Other Ambulatory Visit: Payer: Self-pay

## 2020-05-18 ENCOUNTER — Inpatient Hospital Stay: Payer: Medicare Other | Attending: Oncology

## 2020-05-18 ENCOUNTER — Inpatient Hospital Stay (HOSPITAL_BASED_OUTPATIENT_CLINIC_OR_DEPARTMENT_OTHER): Payer: Medicare Other | Admitting: Oncology

## 2020-05-18 ENCOUNTER — Other Ambulatory Visit: Payer: Self-pay | Admitting: Oncology

## 2020-05-18 ENCOUNTER — Inpatient Hospital Stay: Payer: Medicare Other

## 2020-05-18 VITALS — BP 170/98 | HR 58 | Temp 97.6°F | Resp 18 | Ht 63.0 in | Wt 273.2 lb

## 2020-05-18 VITALS — BP 153/76

## 2020-05-18 DIAGNOSIS — C50011 Malignant neoplasm of nipple and areola, right female breast: Secondary | ICD-10-CM

## 2020-05-18 DIAGNOSIS — R7881 Bacteremia: Secondary | ICD-10-CM

## 2020-05-18 DIAGNOSIS — D689 Coagulation defect, unspecified: Secondary | ICD-10-CM

## 2020-05-18 DIAGNOSIS — C50919 Malignant neoplasm of unspecified site of unspecified female breast: Secondary | ICD-10-CM

## 2020-05-18 DIAGNOSIS — G8929 Other chronic pain: Secondary | ICD-10-CM | POA: Diagnosis not present

## 2020-05-18 DIAGNOSIS — Z171 Estrogen receptor negative status [ER-]: Secondary | ICD-10-CM

## 2020-05-18 DIAGNOSIS — Z7901 Long term (current) use of anticoagulants: Secondary | ICD-10-CM | POA: Diagnosis not present

## 2020-05-18 DIAGNOSIS — Z5112 Encounter for antineoplastic immunotherapy: Secondary | ICD-10-CM | POA: Diagnosis not present

## 2020-05-18 DIAGNOSIS — L03113 Cellulitis of right upper limb: Secondary | ICD-10-CM | POA: Diagnosis not present

## 2020-05-18 DIAGNOSIS — C50012 Malignant neoplasm of nipple and areola, left female breast: Secondary | ICD-10-CM | POA: Diagnosis not present

## 2020-05-18 DIAGNOSIS — C50812 Malignant neoplasm of overlapping sites of left female breast: Secondary | ICD-10-CM | POA: Diagnosis not present

## 2020-05-18 DIAGNOSIS — I871 Compression of vein: Secondary | ICD-10-CM | POA: Diagnosis not present

## 2020-05-18 DIAGNOSIS — Z7189 Other specified counseling: Secondary | ICD-10-CM

## 2020-05-18 DIAGNOSIS — Z23 Encounter for immunization: Secondary | ICD-10-CM | POA: Insufficient documentation

## 2020-05-18 DIAGNOSIS — C50911 Malignant neoplasm of unspecified site of right female breast: Secondary | ICD-10-CM | POA: Diagnosis not present

## 2020-05-18 LAB — CBC WITH DIFFERENTIAL (CANCER CENTER ONLY)
Abs Immature Granulocytes: 0.03 10*3/uL (ref 0.00–0.07)
Basophils Absolute: 0 10*3/uL (ref 0.0–0.1)
Basophils Relative: 0 %
Eosinophils Absolute: 0.1 10*3/uL (ref 0.0–0.5)
Eosinophils Relative: 1 %
HCT: 33.5 % — ABNORMAL LOW (ref 36.0–46.0)
Hemoglobin: 11.4 g/dL — ABNORMAL LOW (ref 12.0–15.0)
Immature Granulocytes: 0 %
Lymphocytes Relative: 27 %
Lymphs Abs: 2.1 10*3/uL (ref 0.7–4.0)
MCH: 28.4 pg (ref 26.0–34.0)
MCHC: 34 g/dL (ref 30.0–36.0)
MCV: 83.5 fL (ref 80.0–100.0)
Monocytes Absolute: 0.7 10*3/uL (ref 0.1–1.0)
Monocytes Relative: 9 %
Neutro Abs: 4.9 10*3/uL (ref 1.7–7.7)
Neutrophils Relative %: 63 %
Platelet Count: 230 10*3/uL (ref 150–400)
RBC: 4.01 MIL/uL (ref 3.87–5.11)
RDW: 17.2 % — ABNORMAL HIGH (ref 11.5–15.5)
WBC Count: 7.8 10*3/uL (ref 4.0–10.5)
nRBC: 0 % (ref 0.0–0.2)

## 2020-05-18 LAB — CMP (CANCER CENTER ONLY)
ALT: 9 U/L (ref 0–44)
AST: 10 U/L — ABNORMAL LOW (ref 15–41)
Albumin: 3.5 g/dL (ref 3.5–5.0)
Alkaline Phosphatase: 53 U/L (ref 38–126)
Anion gap: 3 — ABNORMAL LOW (ref 5–15)
BUN: 23 mg/dL (ref 8–23)
CO2: 30 mmol/L (ref 22–32)
Calcium: 9.3 mg/dL (ref 8.9–10.3)
Chloride: 106 mmol/L (ref 98–111)
Creatinine: 0.87 mg/dL (ref 0.44–1.00)
GFR, Est AFR Am: >60 mL/min (ref >60)
GFR, Estimated: >60 mL/min (ref >60)
Glucose, Bld: 94 mg/dL (ref 70–99)
Potassium: 4.5 mmol/L (ref 3.5–5.1)
Sodium: 139 mmol/L (ref 135–145)
Total Bilirubin: 0.6 mg/dL (ref 0.3–1.2)
Total Protein: 7.4 g/dL (ref 6.5–8.1)

## 2020-05-18 LAB — PROTIME-INR
INR: 3.1 — ABNORMAL HIGH (ref 0.8–1.2)
Prothrombin Time: 30.9 seconds — ABNORMAL HIGH (ref 11.4–15.2)

## 2020-05-18 MED ORDER — LORAZEPAM 2 MG/ML IJ SOLN
INTRAMUSCULAR | Status: AC
  Filled 2020-05-18: qty 1

## 2020-05-18 MED ORDER — SODIUM CHLORIDE 0.9 % IV SOLN
Freq: Once | INTRAVENOUS | Status: AC
  Filled 2020-05-18: qty 250

## 2020-05-18 MED ORDER — ACETAMINOPHEN 325 MG PO TABS
650.0000 mg | ORAL_TABLET | Freq: Once | ORAL | Status: AC
  Administered 2020-05-18: 650 mg via ORAL

## 2020-05-18 MED ORDER — TRASTUZUMAB-DKST CHEMO 150 MG IV SOLR
750.0000 mg | Freq: Once | INTRAVENOUS | Status: AC
  Administered 2020-05-18: 750 mg via INTRAVENOUS
  Filled 2020-05-18: qty 35.72

## 2020-05-18 MED ORDER — DIPHENHYDRAMINE HCL 25 MG PO CAPS
ORAL_CAPSULE | ORAL | Status: AC
  Filled 2020-05-18: qty 1

## 2020-05-18 MED ORDER — SODIUM CHLORIDE 0.9% FLUSH
10.0000 mL | INTRAVENOUS | Status: DC | PRN
  Administered 2020-05-18: 10 mL
  Filled 2020-05-18: qty 10

## 2020-05-18 MED ORDER — ACETAMINOPHEN 325 MG PO TABS
ORAL_TABLET | ORAL | Status: AC
  Filled 2020-05-18: qty 2

## 2020-05-18 MED ORDER — DIPHENHYDRAMINE HCL 25 MG PO CAPS
25.0000 mg | ORAL_CAPSULE | Freq: Once | ORAL | Status: AC
  Administered 2020-05-18: 25 mg via ORAL

## 2020-05-18 MED ORDER — INFLUENZA VAC A&B SA ADJ QUAD 0.5 ML IM PRSY
PREFILLED_SYRINGE | INTRAMUSCULAR | Status: AC
  Filled 2020-05-18: qty 0.5

## 2020-05-18 MED ORDER — HEPARIN SOD (PORK) LOCK FLUSH 100 UNIT/ML IV SOLN
500.0000 [IU] | Freq: Once | INTRAVENOUS | Status: AC | PRN
  Administered 2020-05-18: 500 [IU]
  Filled 2020-05-18: qty 5

## 2020-05-18 MED ORDER — INFLUENZA VAC A&B SA ADJ QUAD 0.5 ML IM PRSY
0.5000 mL | PREFILLED_SYRINGE | Freq: Once | INTRAMUSCULAR | Status: AC
  Administered 2020-05-18: 0.5 mL via INTRAMUSCULAR

## 2020-05-18 MED ORDER — LORAZEPAM 2 MG/ML IJ SOLN
1.0000 mg | Freq: Once | INTRAMUSCULAR | Status: AC | PRN
  Administered 2020-05-18: 1 mg via INTRAVENOUS

## 2020-05-18 NOTE — Patient Instructions (Signed)
[facility] Discharge Instructions for Patients Receiving Chemotherapy  Today you received the following chemotherapy agents trastuzumab.  To help prevent nausea and vomiting after your treatment, we encourage you to take your nausea medication as directed.    If you develop nausea and vomiting that is not controlled by your nausea medication, call the clinic.   BELOW ARE SYMPTOMS THAT SHOULD BE REPORTED IMMEDIATELY:  *FEVER GREATER THAN 100.5 F  *CHILLS WITH OR WITHOUT FEVER  NAUSEA AND VOMITING THAT IS NOT CONTROLLED WITH YOUR NAUSEA MEDICATION  *UNUSUAL SHORTNESS OF BREATH  *UNUSUAL BRUISING OR BLEEDING  TENDERNESS IN MOUTH AND THROAT WITH OR WITHOUT PRESENCE OF ULCERS  *URINARY PROBLEMS  *BOWEL PROBLEMS  UNUSUAL RASH Items with * indicate a potential emergency and should be followed up as soon as possible.  Feel free to call the clinic should you have any questions or concerns. The clinic phone number is [phone].  Please show the CHEMO ALERT CARD at check-in to the Emergency Department and triage nurse.   

## 2020-05-18 NOTE — Addendum Note (Signed)
Addended by: Chauncey Cruel on: 05/18/2020 09:58 AM   Modules accepted: Orders

## 2020-05-19 ENCOUNTER — Ambulatory Visit: Payer: Medicare Other

## 2020-05-19 ENCOUNTER — Ambulatory Visit: Payer: Medicare Other | Admitting: Oncology

## 2020-05-19 ENCOUNTER — Telehealth: Payer: Self-pay | Admitting: Oncology

## 2020-05-19 ENCOUNTER — Other Ambulatory Visit: Payer: Medicare Other

## 2020-05-19 NOTE — Telephone Encounter (Signed)
Scheduled appts per 9/22 los. Pt confirmed appt date and time.

## 2020-05-24 DIAGNOSIS — Z79899 Other long term (current) drug therapy: Secondary | ICD-10-CM | POA: Diagnosis not present

## 2020-05-24 DIAGNOSIS — I5189 Other ill-defined heart diseases: Secondary | ICD-10-CM | POA: Diagnosis not present

## 2020-05-24 DIAGNOSIS — Z7952 Long term (current) use of systemic steroids: Secondary | ICD-10-CM | POA: Diagnosis not present

## 2020-05-24 DIAGNOSIS — G894 Chronic pain syndrome: Secondary | ICD-10-CM | POA: Diagnosis not present

## 2020-05-24 DIAGNOSIS — H259 Unspecified age-related cataract: Secondary | ICD-10-CM | POA: Diagnosis not present

## 2020-05-24 DIAGNOSIS — Z7901 Long term (current) use of anticoagulants: Secondary | ICD-10-CM | POA: Diagnosis not present

## 2020-05-24 DIAGNOSIS — M1990 Unspecified osteoarthritis, unspecified site: Secondary | ICD-10-CM | POA: Diagnosis not present

## 2020-05-24 DIAGNOSIS — I10 Essential (primary) hypertension: Secondary | ICD-10-CM | POA: Diagnosis not present

## 2020-05-24 DIAGNOSIS — H25812 Combined forms of age-related cataract, left eye: Secondary | ICD-10-CM | POA: Diagnosis not present

## 2020-05-25 ENCOUNTER — Other Ambulatory Visit: Payer: Self-pay | Admitting: *Deleted

## 2020-05-25 ENCOUNTER — Other Ambulatory Visit: Payer: Self-pay | Admitting: Oncology

## 2020-05-25 MED ORDER — ALPRAZOLAM 1 MG PO TABS
1.0000 mg | ORAL_TABLET | Freq: Three times a day (TID) | ORAL | 1 refills | Status: DC | PRN
Start: 2020-05-25 — End: 2020-07-12

## 2020-06-06 DIAGNOSIS — M170 Bilateral primary osteoarthritis of knee: Secondary | ICD-10-CM | POA: Diagnosis not present

## 2020-06-14 DIAGNOSIS — G4700 Insomnia, unspecified: Secondary | ICD-10-CM | POA: Diagnosis not present

## 2020-06-14 DIAGNOSIS — M170 Bilateral primary osteoarthritis of knee: Secondary | ICD-10-CM | POA: Diagnosis not present

## 2020-06-14 DIAGNOSIS — F419 Anxiety disorder, unspecified: Secondary | ICD-10-CM | POA: Diagnosis not present

## 2020-06-14 DIAGNOSIS — I10 Essential (primary) hypertension: Secondary | ICD-10-CM | POA: Diagnosis not present

## 2020-06-16 ENCOUNTER — Inpatient Hospital Stay: Payer: Medicare Other

## 2020-06-16 ENCOUNTER — Other Ambulatory Visit: Payer: Self-pay

## 2020-06-16 ENCOUNTER — Inpatient Hospital Stay: Payer: Medicare Other | Attending: Oncology

## 2020-06-16 VITALS — BP 152/66 | HR 56 | Temp 99.0°F | Resp 18

## 2020-06-16 DIAGNOSIS — C50012 Malignant neoplasm of nipple and areola, left female breast: Secondary | ICD-10-CM

## 2020-06-16 DIAGNOSIS — C50011 Malignant neoplasm of nipple and areola, right female breast: Secondary | ICD-10-CM

## 2020-06-16 DIAGNOSIS — C50812 Malignant neoplasm of overlapping sites of left female breast: Secondary | ICD-10-CM | POA: Diagnosis not present

## 2020-06-16 DIAGNOSIS — D689 Coagulation defect, unspecified: Secondary | ICD-10-CM | POA: Diagnosis not present

## 2020-06-16 DIAGNOSIS — Z5112 Encounter for antineoplastic immunotherapy: Secondary | ICD-10-CM | POA: Insufficient documentation

## 2020-06-16 DIAGNOSIS — Z171 Estrogen receptor negative status [ER-]: Secondary | ICD-10-CM

## 2020-06-16 DIAGNOSIS — C50911 Malignant neoplasm of unspecified site of right female breast: Secondary | ICD-10-CM | POA: Insufficient documentation

## 2020-06-16 DIAGNOSIS — I152 Hypertension secondary to endocrine disorders: Secondary | ICD-10-CM

## 2020-06-16 DIAGNOSIS — I5032 Chronic diastolic (congestive) heart failure: Secondary | ICD-10-CM

## 2020-06-16 DIAGNOSIS — C50919 Malignant neoplasm of unspecified site of unspecified female breast: Secondary | ICD-10-CM

## 2020-06-16 LAB — COMPREHENSIVE METABOLIC PANEL
ALT: 12 U/L (ref 0–44)
AST: 10 U/L — ABNORMAL LOW (ref 15–41)
Albumin: 3.5 g/dL (ref 3.5–5.0)
Alkaline Phosphatase: 64 U/L (ref 38–126)
Anion gap: 8 (ref 5–15)
BUN: 26 mg/dL — ABNORMAL HIGH (ref 8–23)
CO2: 27 mmol/L (ref 22–32)
Calcium: 9.5 mg/dL (ref 8.9–10.3)
Chloride: 107 mmol/L (ref 98–111)
Creatinine, Ser: 0.89 mg/dL (ref 0.44–1.00)
GFR, Estimated: >60 mL/min (ref >60)
Glucose, Bld: 100 mg/dL — ABNORMAL HIGH (ref 70–99)
Potassium: 3.8 mmol/L (ref 3.5–5.1)
Sodium: 142 mmol/L (ref 135–145)
Total Bilirubin: 0.5 mg/dL (ref 0.3–1.2)
Total Protein: 7.4 g/dL (ref 6.5–8.1)

## 2020-06-16 LAB — CBC WITH DIFFERENTIAL/PLATELET
Abs Immature Granulocytes: 0.05 10*3/uL (ref 0.00–0.07)
Basophils Absolute: 0 10*3/uL (ref 0.0–0.1)
Basophils Relative: 0 %
Eosinophils Absolute: 0.1 10*3/uL (ref 0.0–0.5)
Eosinophils Relative: 1 %
HCT: 32 % — ABNORMAL LOW (ref 36.0–46.0)
Hemoglobin: 11.1 g/dL — ABNORMAL LOW (ref 12.0–15.0)
Immature Granulocytes: 0 %
Lymphocytes Relative: 17 %
Lymphs Abs: 1.9 10*3/uL (ref 0.7–4.0)
MCH: 29.2 pg (ref 26.0–34.0)
MCHC: 34.7 g/dL (ref 30.0–36.0)
MCV: 84.2 fL (ref 80.0–100.0)
Monocytes Absolute: 0.8 10*3/uL (ref 0.1–1.0)
Monocytes Relative: 7 %
Neutro Abs: 8.3 10*3/uL — ABNORMAL HIGH (ref 1.7–7.7)
Neutrophils Relative %: 75 %
Platelets: 242 10*3/uL (ref 150–400)
RBC: 3.8 MIL/uL — ABNORMAL LOW (ref 3.87–5.11)
RDW: 16.6 % — ABNORMAL HIGH (ref 11.5–15.5)
WBC: 11.2 10*3/uL — ABNORMAL HIGH (ref 4.0–10.5)
nRBC: 0 % (ref 0.0–0.2)

## 2020-06-16 LAB — PROTIME-INR
INR: 2.6 — ABNORMAL HIGH (ref 0.8–1.2)
Prothrombin Time: 26.6 s — ABNORMAL HIGH (ref 11.4–15.2)

## 2020-06-16 MED ORDER — LORAZEPAM 2 MG/ML IJ SOLN
1.0000 mg | Freq: Once | INTRAMUSCULAR | Status: AC | PRN
  Administered 2020-06-16: 1 mg via INTRAVENOUS

## 2020-06-16 MED ORDER — TRASTUZUMAB-DKST CHEMO 150 MG IV SOLR
750.0000 mg | Freq: Once | INTRAVENOUS | Status: AC
  Administered 2020-06-16: 750 mg via INTRAVENOUS
  Filled 2020-06-16: qty 35.72

## 2020-06-16 MED ORDER — DIPHENHYDRAMINE HCL 25 MG PO CAPS
25.0000 mg | ORAL_CAPSULE | Freq: Once | ORAL | Status: AC
  Administered 2020-06-16: 25 mg via ORAL

## 2020-06-16 MED ORDER — HEPARIN SOD (PORK) LOCK FLUSH 100 UNIT/ML IV SOLN
500.0000 [IU] | Freq: Once | INTRAVENOUS | Status: AC | PRN
  Administered 2020-06-16: 500 [IU]
  Filled 2020-06-16: qty 5

## 2020-06-16 MED ORDER — ACETAMINOPHEN 325 MG PO TABS
ORAL_TABLET | ORAL | Status: AC
  Filled 2020-06-16: qty 2

## 2020-06-16 MED ORDER — LORAZEPAM 2 MG/ML IJ SOLN
INTRAMUSCULAR | Status: AC
  Filled 2020-06-16: qty 1

## 2020-06-16 MED ORDER — SODIUM CHLORIDE 0.9% FLUSH
10.0000 mL | INTRAVENOUS | Status: DC | PRN
  Administered 2020-06-16: 10 mL
  Filled 2020-06-16: qty 10

## 2020-06-16 MED ORDER — ACETAMINOPHEN 325 MG PO TABS
650.0000 mg | ORAL_TABLET | Freq: Once | ORAL | Status: AC
  Administered 2020-06-16: 650 mg via ORAL

## 2020-06-16 MED ORDER — SODIUM CHLORIDE 0.9 % IV SOLN
Freq: Once | INTRAVENOUS | Status: AC
  Filled 2020-06-16: qty 250

## 2020-06-16 MED ORDER — DIPHENHYDRAMINE HCL 25 MG PO CAPS
ORAL_CAPSULE | ORAL | Status: AC
  Filled 2020-06-16: qty 1

## 2020-06-16 NOTE — Progress Notes (Signed)
Per Dr Jana Hakim, [place] to treat today with echo from April 2020. Echo will be ordered.

## 2020-06-16 NOTE — Patient Instructions (Signed)
[facility] Discharge Instructions for Patients Receiving Chemotherapy  Today you received the following chemotherapy agents trastuzumab.  To help prevent nausea and vomiting after your treatment, we encourage you to take your nausea medication as directed.    If you develop nausea and vomiting that is not controlled by your nausea medication, call the clinic.   BELOW ARE SYMPTOMS THAT SHOULD BE REPORTED IMMEDIATELY:  *FEVER GREATER THAN 100.5 F  *CHILLS WITH OR WITHOUT FEVER  NAUSEA AND VOMITING THAT IS NOT CONTROLLED WITH YOUR NAUSEA MEDICATION  *UNUSUAL SHORTNESS OF BREATH  *UNUSUAL BRUISING OR BLEEDING  TENDERNESS IN MOUTH AND THROAT WITH OR WITHOUT PRESENCE OF ULCERS  *URINARY PROBLEMS  *BOWEL PROBLEMS  UNUSUAL RASH Items with * indicate a potential emergency and should be followed up as soon as possible.  Feel free to call the clinic should you have any questions or concerns. The clinic phone number is [phone].  Please show the CHEMO ALERT CARD at check-in to the Emergency Department and triage nurse.   

## 2020-06-21 DIAGNOSIS — H259 Unspecified age-related cataract: Secondary | ICD-10-CM | POA: Diagnosis not present

## 2020-06-21 DIAGNOSIS — Z79891 Long term (current) use of opiate analgesic: Secondary | ICD-10-CM | POA: Diagnosis not present

## 2020-06-21 DIAGNOSIS — Z79899 Other long term (current) drug therapy: Secondary | ICD-10-CM | POA: Diagnosis not present

## 2020-06-21 DIAGNOSIS — G894 Chronic pain syndrome: Secondary | ICD-10-CM | POA: Diagnosis not present

## 2020-06-21 DIAGNOSIS — H25811 Combined forms of age-related cataract, right eye: Secondary | ICD-10-CM | POA: Diagnosis not present

## 2020-06-21 DIAGNOSIS — I10 Essential (primary) hypertension: Secondary | ICD-10-CM | POA: Diagnosis not present

## 2020-06-21 DIAGNOSIS — Z6830 Body mass index (BMI) 30.0-30.9, adult: Secondary | ICD-10-CM | POA: Diagnosis not present

## 2020-06-21 DIAGNOSIS — M1990 Unspecified osteoarthritis, unspecified site: Secondary | ICD-10-CM | POA: Diagnosis not present

## 2020-06-21 DIAGNOSIS — G629 Polyneuropathy, unspecified: Secondary | ICD-10-CM | POA: Diagnosis not present

## 2020-06-21 DIAGNOSIS — E669 Obesity, unspecified: Secondary | ICD-10-CM | POA: Diagnosis not present

## 2020-07-12 ENCOUNTER — Other Ambulatory Visit: Payer: Self-pay | Admitting: Oncology

## 2020-07-12 ENCOUNTER — Other Ambulatory Visit: Payer: Self-pay

## 2020-07-12 DIAGNOSIS — C50919 Malignant neoplasm of unspecified site of unspecified female breast: Secondary | ICD-10-CM

## 2020-07-12 MED ORDER — ALPRAZOLAM 1 MG PO TABS
1.0000 mg | ORAL_TABLET | Freq: Three times a day (TID) | ORAL | 1 refills | Status: DC | PRN
Start: 2020-07-12 — End: 2020-07-20

## 2020-07-12 MED ORDER — TEMAZEPAM 30 MG PO CAPS: ORAL_CAPSULE | ORAL | 2 refills | Status: DC

## 2020-07-12 NOTE — Progress Notes (Signed)
Pt called requesting Shingles vaccine. OK per Dr Jana Hakim for pt to receive during infusion appt 11/18. Order is under signed & held for RN to release.

## 2020-07-13 ENCOUNTER — Other Ambulatory Visit: Payer: Medicare Other

## 2020-07-13 ENCOUNTER — Ambulatory Visit: Payer: Medicare Other

## 2020-07-14 ENCOUNTER — Inpatient Hospital Stay: Payer: Medicare Other | Attending: Oncology

## 2020-07-14 ENCOUNTER — Inpatient Hospital Stay: Payer: Medicare Other

## 2020-07-14 ENCOUNTER — Other Ambulatory Visit: Payer: Self-pay

## 2020-07-14 VITALS — BP 152/83 | HR 60 | Temp 98.7°F | Resp 18

## 2020-07-14 DIAGNOSIS — D689 Coagulation defect, unspecified: Secondary | ICD-10-CM | POA: Insufficient documentation

## 2020-07-14 DIAGNOSIS — Z5112 Encounter for antineoplastic immunotherapy: Secondary | ICD-10-CM | POA: Diagnosis not present

## 2020-07-14 DIAGNOSIS — Z171 Estrogen receptor negative status [ER-]: Secondary | ICD-10-CM

## 2020-07-14 DIAGNOSIS — C50011 Malignant neoplasm of nipple and areola, right female breast: Secondary | ICD-10-CM

## 2020-07-14 DIAGNOSIS — C50812 Malignant neoplasm of overlapping sites of left female breast: Secondary | ICD-10-CM | POA: Diagnosis not present

## 2020-07-14 DIAGNOSIS — C50919 Malignant neoplasm of unspecified site of unspecified female breast: Secondary | ICD-10-CM

## 2020-07-14 DIAGNOSIS — C50911 Malignant neoplasm of unspecified site of right female breast: Secondary | ICD-10-CM | POA: Insufficient documentation

## 2020-07-14 LAB — CBC WITH DIFFERENTIAL/PLATELET
Abs Immature Granulocytes: 0.03 10*3/uL (ref 0.00–0.07)
Basophils Absolute: 0 10*3/uL (ref 0.0–0.1)
Basophils Relative: 1 %
Eosinophils Absolute: 0.1 10*3/uL (ref 0.0–0.5)
Eosinophils Relative: 1 %
HCT: 34.8 % — ABNORMAL LOW (ref 36.0–46.0)
Hemoglobin: 12 g/dL (ref 12.0–15.0)
Immature Granulocytes: 0 %
Lymphocytes Relative: 19 %
Lymphs Abs: 1.5 10*3/uL (ref 0.7–4.0)
MCH: 29.3 pg (ref 26.0–34.0)
MCHC: 34.5 g/dL (ref 30.0–36.0)
MCV: 85.1 fL (ref 80.0–100.0)
Monocytes Absolute: 0.6 10*3/uL (ref 0.1–1.0)
Monocytes Relative: 7 %
Neutro Abs: 6 10*3/uL (ref 1.7–7.7)
Neutrophils Relative %: 72 %
Platelets: 261 10*3/uL (ref 150–400)
RBC: 4.09 MIL/uL (ref 3.87–5.11)
RDW: 15.6 % — ABNORMAL HIGH (ref 11.5–15.5)
WBC: 8.2 10*3/uL (ref 4.0–10.5)
nRBC: 0 % (ref 0.0–0.2)

## 2020-07-14 LAB — COMPREHENSIVE METABOLIC PANEL
ALT: 10 U/L (ref 0–44)
AST: 14 U/L — ABNORMAL LOW (ref 15–41)
Albumin: 3.7 g/dL (ref 3.5–5.0)
Alkaline Phosphatase: 67 U/L (ref 38–126)
Anion gap: 9 (ref 5–15)
BUN: 21 mg/dL (ref 8–23)
CO2: 26 mmol/L (ref 22–32)
Calcium: 9.5 mg/dL (ref 8.9–10.3)
Chloride: 108 mmol/L (ref 98–111)
Creatinine, Ser: 1.02 mg/dL — ABNORMAL HIGH (ref 0.44–1.00)
GFR, Estimated: 60 mL/min — ABNORMAL LOW (ref >60)
Glucose, Bld: 94 mg/dL (ref 70–99)
Potassium: 3.5 mmol/L (ref 3.5–5.1)
Sodium: 143 mmol/L (ref 135–145)
Total Bilirubin: 0.9 mg/dL (ref 0.3–1.2)
Total Protein: 7.9 g/dL (ref 6.5–8.1)

## 2020-07-14 LAB — PROTIME-INR
INR: 1.6 — ABNORMAL HIGH (ref 0.8–1.2)
Prothrombin Time: 18.6 seconds — ABNORMAL HIGH (ref 11.4–15.2)

## 2020-07-14 MED ORDER — ACETAMINOPHEN 325 MG PO TABS
650.0000 mg | ORAL_TABLET | Freq: Once | ORAL | Status: AC
  Administered 2020-07-14: 650 mg via ORAL

## 2020-07-14 MED ORDER — SODIUM CHLORIDE 0.9 % IV SOLN
Freq: Once | INTRAVENOUS | Status: AC
  Filled 2020-07-14: qty 250

## 2020-07-14 MED ORDER — LORAZEPAM 2 MG/ML IJ SOLN
INTRAMUSCULAR | Status: AC
  Filled 2020-07-14: qty 1

## 2020-07-14 MED ORDER — LORAZEPAM 2 MG/ML IJ SOLN
1.0000 mg | Freq: Once | INTRAMUSCULAR | Status: AC | PRN
  Administered 2020-07-14: 1 mg via INTRAVENOUS

## 2020-07-14 MED ORDER — TRASTUZUMAB-DKST CHEMO 150 MG IV SOLR
750.0000 mg | Freq: Once | INTRAVENOUS | Status: AC
  Administered 2020-07-14: 750 mg via INTRAVENOUS
  Filled 2020-07-14: qty 35.72

## 2020-07-14 MED ORDER — SODIUM CHLORIDE 0.9% FLUSH
10.0000 mL | INTRAVENOUS | Status: DC | PRN
  Administered 2020-07-14: 10 mL
  Filled 2020-07-14: qty 10

## 2020-07-14 MED ORDER — DIPHENHYDRAMINE HCL 25 MG PO CAPS
25.0000 mg | ORAL_CAPSULE | Freq: Once | ORAL | Status: AC
  Administered 2020-07-14: 25 mg via ORAL

## 2020-07-14 MED ORDER — DIPHENHYDRAMINE HCL 25 MG PO CAPS
ORAL_CAPSULE | ORAL | Status: AC
  Filled 2020-07-14: qty 1

## 2020-07-14 MED ORDER — HEPARIN SOD (PORK) LOCK FLUSH 100 UNIT/ML IV SOLN
500.0000 [IU] | Freq: Once | INTRAVENOUS | Status: AC | PRN
  Administered 2020-07-14: 500 [IU]
  Filled 2020-07-14: qty 5

## 2020-07-14 MED ORDER — ACETAMINOPHEN 325 MG PO TABS
ORAL_TABLET | ORAL | Status: AC
  Filled 2020-07-14: qty 2

## 2020-07-14 NOTE — Patient Instructions (Signed)
[facility] Discharge Instructions for Patients Receiving Chemotherapy  Today you received the following chemotherapy agents: Trastuzumab   To help prevent nausea and vomiting after your treatment, we encourage you to take your nausea medication  as prescribed.    If you develop nausea and vomiting that is not controlled by your nausea medication, call the clinic.   BELOW ARE SYMPTOMS THAT SHOULD BE REPORTED IMMEDIATELY:  *FEVER GREATER THAN 100.5 F  *CHILLS WITH OR WITHOUT FEVER  NAUSEA AND VOMITING THAT IS NOT CONTROLLED WITH YOUR NAUSEA MEDICATION  *UNUSUAL SHORTNESS OF BREATH  *UNUSUAL BRUISING OR BLEEDING  TENDERNESS IN MOUTH AND THROAT WITH OR WITHOUT PRESENCE OF ULCERS  *URINARY PROBLEMS  *BOWEL PROBLEMS  UNUSUAL RASH Items with * indicate a potential emergency and should be followed up as soon as possible.  Feel free to call the clinic should you have any questions or concerns. The clinic phone number is [phone].  Please show the CHEMO ALERT CARD at check-in to the Emergency Department and triage nurse.   

## 2020-07-14 NOTE — Progress Notes (Signed)
Received okay to use previous echocardiogram from April. Patient is scheduled for updated echocardiogram and is aware of the appointment.

## 2020-07-19 ENCOUNTER — Other Ambulatory Visit: Payer: Self-pay | Admitting: Oncology

## 2020-07-19 MED ORDER — GABAPENTIN 300 MG PO CAPS
ORAL_CAPSULE | ORAL | 0 refills | Status: DC
Start: 2020-07-19 — End: 2020-11-14

## 2020-07-19 NOTE — Telephone Encounter (Signed)
Pt called states that pharmacy never received rx for alprazolam 1 mg 1 tab PO TID PRN Dr Jana Hakim sent 07/12/20. This information was given to Dr Jana Hakim to review. Pt is aware and verbalizes thanks.

## 2020-07-20 ENCOUNTER — Other Ambulatory Visit: Payer: Self-pay | Admitting: Oncology

## 2020-07-20 MED ORDER — ALPRAZOLAM 1 MG PO TABS
1.0000 mg | ORAL_TABLET | Freq: Three times a day (TID) | ORAL | 1 refills | Status: DC | PRN
Start: 2020-07-20 — End: 2020-07-20

## 2020-07-20 MED ORDER — ALPRAZOLAM 1 MG PO TABS
1.0000 mg | ORAL_TABLET | Freq: Three times a day (TID) | ORAL | 1 refills | Status: DC | PRN
Start: 2020-07-20 — End: 2020-09-27

## 2020-07-25 DIAGNOSIS — Z09 Encounter for follow-up examination after completed treatment for conditions other than malignant neoplasm: Secondary | ICD-10-CM | POA: Diagnosis not present

## 2020-07-25 DIAGNOSIS — H524 Presbyopia: Secondary | ICD-10-CM | POA: Diagnosis not present

## 2020-08-04 ENCOUNTER — Other Ambulatory Visit: Payer: Self-pay

## 2020-08-04 ENCOUNTER — Ambulatory Visit (HOSPITAL_COMMUNITY)
Admission: RE | Admit: 2020-08-04 | Discharge: 2020-08-04 | Disposition: A | Discharge status: Home / Self Care | Payer: Medicare Other | Source: Ambulatory Visit | Attending: Oncology | Admitting: Oncology

## 2020-08-04 ENCOUNTER — Encounter (HOSPITAL_COMMUNITY): Payer: Self-pay

## 2020-08-04 DIAGNOSIS — C50919 Malignant neoplasm of unspecified site of unspecified female breast: Secondary | ICD-10-CM | POA: Insufficient documentation

## 2020-08-04 DIAGNOSIS — I871 Compression of vein: Secondary | ICD-10-CM

## 2020-08-04 DIAGNOSIS — Z171 Estrogen receptor negative status [ER-]: Secondary | ICD-10-CM | POA: Diagnosis not present

## 2020-08-04 DIAGNOSIS — C50011 Malignant neoplasm of nipple and areola, right female breast: Secondary | ICD-10-CM | POA: Insufficient documentation

## 2020-08-04 DIAGNOSIS — R7881 Bacteremia: Secondary | ICD-10-CM | POA: Insufficient documentation

## 2020-08-04 DIAGNOSIS — C50012 Malignant neoplasm of nipple and areola, left female breast: Secondary | ICD-10-CM | POA: Diagnosis not present

## 2020-08-04 DIAGNOSIS — Z7189 Other specified counseling: Secondary | ICD-10-CM | POA: Insufficient documentation

## 2020-08-04 DIAGNOSIS — D689 Coagulation defect, unspecified: Secondary | ICD-10-CM | POA: Diagnosis not present

## 2020-08-04 MED ORDER — SODIUM CHLORIDE (PF) 0.9 % IJ SOLN
INTRAMUSCULAR | Status: AC
  Filled 2020-08-04: qty 50

## 2020-08-04 MED ORDER — IOHEXOL 300 MG/ML  SOLN
100.0000 mL | Freq: Once | INTRAMUSCULAR | Status: AC | PRN
  Administered 2020-08-04: 75 mL via INTRAVENOUS

## 2020-08-05 ENCOUNTER — Encounter: Payer: Self-pay | Admitting: Oncology

## 2020-08-05 ENCOUNTER — Other Ambulatory Visit (HOSPITAL_COMMUNITY): Payer: Self-pay | Admitting: Cardiology

## 2020-08-10 ENCOUNTER — Ambulatory Visit: Payer: Medicare Other | Admitting: Oncology

## 2020-08-10 ENCOUNTER — Other Ambulatory Visit: Payer: Medicare Other

## 2020-08-10 ENCOUNTER — Ambulatory Visit: Payer: Medicare Other

## 2020-08-10 NOTE — Progress Notes (Signed)
[place] [facility]  [phone] [phone]) [phone]    ID: [name]   DOB: [birth year]  MR#: [id number]  IZX#:[id number]   Patient Care Team: Ernestene Kiel, MD as PCP - General (Internal Medicine) Haze Rushing., MD as Consulting Physician (Pain Medicine) Joya Salm, MD as Referring Physician (Orthopedic Surgery) Jaymes Graff, DO as Consulting Physician (Orthopedic Surgery) Larey Dresser, MD as Consulting Physician (Cardiology) Bensimhon, Shaune Pascal, MD as Consulting Physician (Cardiology) Lorelle Gibbs, MD (Radiology) Tommy Medal, Lavell Islam, MD as Consulting Physician (Infectious Diseases) Gery Pray, MD as Consulting Physician (Radiation Oncology) Nicolette Gieske, Virgie Dad, MD as Consulting Physician (Oncology)   CHIEF COMPLAINT: Metastatic HER-2 positive, estrogen receptor negative breast cancer (s/p bilateral mastectomies)  CURRENT TREATMENT: Trastuzumab every 28 days; lifelong warfarin   INTERVAL HISTORY: [name] returns today for follow-up and treatment of her estrogen receptor negative, but HER2 amplified breast cancer.   Since her last visit, she underwent restaging chest CT on 08/04/2020 showing no definite evidence of metastatic disease to chest.  She continues on trastuzumab, which she receives every 28 days.  She has absolutely no side effects related to this that she is aware of.  She is scheduled for repeat echocardiogram 08/31/2020  She is on lifelong warfarin. She tolerates this well and without any noticeable side effects.  INR today is pending   REVIEW OF SYSTEMS: [name] has had some bruising, from working in the yard and housework but she has not had any falls and she is not aware of any melena bright red blood per rectum or other evidence of bleeding.  She tells me her husband is more stable and is trying to do more for himself.  After receiving the booster for Covid in September she was listless for about 2 days but that  went away.  A detailed review of systems today is otherwise stable   COVID 19 VACCINATION STATUS: fully vaccinated [name]), second dose 11/23/2019; infection 04/08/2020, booster late September 2021   BREAST CANCER HISTORY:   From the earlier summary:  [name] is 69 years old [name] female.  This woman has been in good health all of her life.  She noted a swelling and discomfort in her right breast in June 2004.  She was seen in the Emergency Room in [place] and was treated for mastitis.  She was treated for a number of months with mastitis and the swelling did not get better. She was given hydrocodone and Cipro.  Finally, the swelling did get better and ultimately the nipple became retracted and she noticed some dimpling in her skin. She had a mammogram in July of 2004 in [place] with subsequent mammogram on May 27, 2003, by Dr. Isaiah Blakes.  Mammogram done on September 30 showed marked increased density in the left breast.  Biopsy was performed the same day.  It was noted at the 12 o'clock position, deep in the breast was a focal hypoechoic mass, at least 3.5 cm in diameter.  Biopsy did in fact show invasive in situ mammary carcinoma. This was felt to be both at least intermediate, high grade.  No definite lymphovascular invasion was identified. ER and PR negative, Her2 testing positive. [name] continues to have pain in her breast.  She continues to take hydrocodone a number of times a day.  She has been seen by Dr. Rosana Hoes, who felt that neoadjuvant chemotherapy would be required.    Initial staging studies showed evidence of liver and lung mets.  Patient also has evidence of bone lesions. Patient started neoadjuvant chemotherapy, Taxotere/Carbo/Herceptin in October 2004.   Patient had a CT scan in December 2004 which demonstrated extensive clot in the SVC innominate vein, bilateral jugular vein and  She was started on anticoagulation therapy. She received a total of 6 cycles of  Taxotere/Carbo/Herceptin, completed in April 2005."  Patient has been on  trastuzumab continued indefinitely; has also received lapatinib and capecitabine for variable intervals in 2007-2008. Most recent echo 12/01/2013 showed an ejection fraction of 55%. She is status post bilateral mastectomies with bilateral axillary lymph node dissection 12/07/2004, showing (a) on the right, a mypT1c ypN1 invasive ductal carcinoma, grade 3, estrogen and progesterone receptor negative, HER-2 positive, with an MIB-1 of 31% (b) on the left, ypT2 ypN1 invasive ductal carcinoma, grade 2, estrogen and progesterone receptor negative, HER-2 positive, with an MIB-1 of 35%. She is Status post radiation June through July of 2006, to the right chest wall, left chest wall, bilateral supraclavicular fossae, and bilateral axillary boosts; with additional radiation to the right and left chest walls and the central chest wall completed November of 2007. She is status post ixempra x9 completed August of 2009. 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 Patient has chronic pain, with negative PET scan 08/24/2013 (no evidence of active cancer). On Neurontin and Tramadol therapy.  Her subsequent history is as detailed below   PAST MEDICAL HISTORY: Past Medical History:  Diagnosis Date  . Breast cancer ([place])    mets to liver and lung  . Breast cancer metastasized to multiple sites ([place]) 02/26/2013  . History of chemotherapy Feb. 2006   taxotere/herceptin/carboplatin  . Hypertension   . Neuropathy   . Radiation 07/31/2006   left upper chest  . Radiation 06/17/2006-06/27/2006   6480 cGy bilat. chest wall  . SVC syndrome   . Thrombosis     PAST SURGICAL HISTORY: Past Surgical History:  Procedure Laterality Date  . ANKLE SURGERY    . BACK SURGERY    . CHOLECYSTECTOMY  1989  . MASTECTOMY Bilateral   . PERIPHERALLY INSERTED CENTRAL CATHETER  INSERTION    . TUBAL LIGATION  1986    FAMILY HISTORY Family History  Problem Relation Age of Onset  . Heart failure Father   . Cancer Father        Prostate cancer  . Heart failure Brother   . Cancer Brother        Prostate cancer  . Diabetes Maternal Aunt   She had three brothers, one died of gunshot wound, one of complications of diabetes mellitus and one of myocardial infarction.  She has no sisters.  Mother died of complications of brain metastasis in 39.  Father has had a myocardial infarction in 1999.  No history of breast or ovarian cancer in the family.     GYNECOLOGIC HISTORY:    No LMP recorded. Patient is postmenopausal. Menarche at age 8.   Gravida 3, para 3.   First live birth at age 42.   No history of breast feeding. No history of hormonal replacement therapy.    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 He used to work as a Art gallery manager, but is now retired and disabled. The patient has three children, [name] who lives in [place] and works  as a prison guard, [name] who lives in [place] and works as a Administrator, and [name] who lives in [place] and also works as a Hydrographic surveyor. The patient has 14 grandchildren and 4 great-grandchildren. She attends a [employer].   ADVANCED DIRECTIVES: In the absence of any documentation to the contrary, the patient's spouse is their HCPOA.    HEALTH MAINTENANCE:  Social History   Tobacco Use  . Smoking status: Never Smoker  . Smokeless tobacco: Never Used  Substance Use Topics  . Alcohol use: Yes    Comment: occasional  . Drug use: No     Colonoscopy: Never and "I don't want one"  PAP:  1987  Bone density:  Never   Lipid panel:  Not on file   Allergies  Allergen Reactions  . Penicillins Hives    PATIENT HAS TOLERATED CEPHALOSPORINGS  . Adhesive  [Tape] Other (See Comments)    Tears skin     Current Outpatient Medications  Medication Sig Dispense Refill  . acetaminophen (TYLENOL) 500 MG tablet Take 1,000 mg by mouth every 6 (six) hours as needed for mild pain or fever.     Marland Kitchen albuterol (PROVENTIL HFA;VENTOLIN HFA) 108 (90 BASE) MCG/ACT inhaler Inhale 2 puffs into the lungs every 6 (six) hours as needed for wheezing. 1 Inhaler 5  . ALPRAZolam (XANAX) 1 MG tablet Take 1 tablet (1 mg total) by mouth 3 (three) times daily as needed. for anxiety 60 tablet 1  . amLODipine (NORVASC) 10 MG tablet Take 1 tablet (10 mg total) by mouth every morning. 30 tablet 4  . baclofen (LIORESAL) 10 MG tablet TAKE 1 TABLET BY MOUTH THREE TIMES DAILY AS NEEDED FOR MUSCLE SPASMS 270 tablet 0  . carvedilol (COREG) 12.5 MG tablet TAKE 1 TABLET(12.5 MG) BY MOUTH TWICE DAILY 60 tablet 6  . diclofenac sodium (VOLTAREN) 1 % GEL Apply 2 g topically daily as needed (for pain). Apply to knees and shoulders 100 g 6  . furosemide (LASIX) 40 MG tablet TAKE 1 TABLET(40 MG) BY MOUTH DAILY 30 tablet 3  . gabapentin (NEURONTIN) 300 MG capsule TAKE 2 CAPSULES(600 MG) BY MOUTH THREE TIMES DAILY 540 capsule 0  . losartan (COZAAR) 100 MG tablet Take 100 mg by mouth every morning.    . mupirocin ointment (BACTROBAN) 2 % APP EXT TO SORE ON FOOT TID FOR 5 DAYS    . oxyCODONE-acetaminophen (PERCOCET) 10-325 MG tablet Take 1 tablet by mouth 4 (four) times daily as needed.    . potassium chloride (K-DUR,KLOR-CON) 20 MEQ tablet Take 1 tablet (20 mEq total) by mouth daily. 30 tablet 3  . predniSONE (DELTASONE) 5 MG tablet     . spironolactone (ALDACTONE) 25 MG tablet TAKE 1 TABLET(25 MG) BY MOUTH DAILY 30 tablet 11  . temazepam (RESTORIL) 30 MG capsule TAKE ONE CAPSULE BY MOUTH EVERY NIGHT AT BEDTIME AS NEEDED FOR SLEEP 30 capsule 2  . warfarin (COUMADIN) 5 MG tablet TAKE 1 TABLET BY MOUTH ON ODD NUMBERED DAYS PLUS 1 AND 1/2 TABLETS ON EVEN NUMBERED DAYS 90 tablet 4   No current  facility-administered medications for this visit.   Facility-Administered Medications Ordered in Other Visits  Medication Dose Route Frequency Provider Last Rate Last Admin  . sodium chloride flush (NS) 0.9 % injection 10 mL  10 mL Intravenous PRN Jeffie Spivack, Virgie Dad, MD   10 mL at 12/15/15 1200  . sodium chloride flush (NS) 0.9 % injection 10 mL  10 mL Intracatheter PRN  Allessandra Bernardi, Virgie Dad, MD   10 mL at 08/14/18 1116  . sodium chloride flush (NS) 0.9 % injection 10 mL  10 mL Intracatheter PRN Eleazar Kimmey, Virgie Dad, MD        OBJECTIVE: African-American woman who appears well  Vitals:   08/11/20 0915  BP: (!) 141/76  Pulse: 77  Resp: 18  Temp: 97.8 F (36.6 C)  SpO2: 100%     Body mass index is 49.92 kg/m.    ECOG FS: 2 Filed Weights   08/11/20 0915  Weight: 281 lb 12.8 oz (127.8 kg)    Sclerae unicteric, EOMs intact Wearing a mask No cervical or supraclavicular adenopathy Lungs no rales or rhonchi Heart regular rate and rhythm Abd soft, nontender, positive bowel sounds MSK no focal spinal tenderness, no upper extremity lymphedema Neuro: nonfocal, well oriented, appropriate affect Breasts: Status post bilateral mastectomies.  No evidence of chest wall recurrence.  Both axillae are benign.   LAB RESULTS: Lab Results  Component Value Date   WBC 8.8 08/11/2020   NEUTROABS 5.2 08/11/2020   HGB 10.8 (L) 08/11/2020   HCT 32.5 (L) 08/11/2020   MCV 88.3 08/11/2020   PLT 240 08/11/2020      Chemistry      Component Value Date/Time   NA 143 07/14/2020 0805   NA 143 08/14/2017 0811   K 3.5 07/14/2020 0805   K 3.8 08/14/2017 0811   CL 108 07/14/2020 0805   CL 104 01/30/2013 0850   CO2 26 07/14/2020 0805   CO2 24 08/14/2017 0811   BUN 21 07/14/2020 0805   BUN 14.9 08/14/2017 0811   CREATININE 1.02 (H) 07/14/2020 0805   CREATININE 0.87 05/18/2020 0846   CREATININE 0.9 08/14/2017 0811      Component Value Date/Time   CALCIUM 9.5 07/14/2020 0805   CALCIUM 9.4 08/14/2017  0811   ALKPHOS 67 07/14/2020 0805   ALKPHOS 73 08/14/2017 0811   AST 14 (L) 07/14/2020 0805   AST 10 (L) 05/18/2020 0846   AST 10 08/14/2017 0811   ALT 10 07/14/2020 0805   ALT 9 05/18/2020 0846   ALT 10 08/14/2017 0811   BILITOT 0.9 07/14/2020 0805   BILITOT 0.6 05/18/2020 0846   BILITOT 0.62 08/14/2017 0811      STUDIES: CT Chest W Contrast  Result Date: 08/05/2020 CLINICAL DATA:  Breast cancer staging, evaluate for disease progression in a 69 year old female EXAM: CT CHEST WITH CONTRAST TECHNIQUE: Multidetector CT imaging of the chest was performed during intravenous contrast administration. CONTRAST:  79m OMNIPAQUE IOHEXOL 300 MG/ML  SOLN COMPARISON:  April 09, 2019 and PET scan of Dec 26, 2018 FINDINGS: Cardiovascular: RIGHT-sided Port-A-Cath terminates in the upper RIGHT atrium. Mildly enlarged and stable without pericardial effusion. Aortic caliber is mildly dilated with stable appearance. Central pulmonary vasculature normal caliber, unremarkable on venous phase assessment. Mediastinum/Nodes: No mediastinal lymphadenopathy post bilateral mastectomy and axillary dissection. Soft tissue in the LEFT upper breast/LEFT axilla likely postoperative change at approximately 3.5 by 1.1 cm without FDG activity on the prior PET is unchanged to slightly smaller. Skip scarring in the RIGHT anterior chest wall is similar. No hilar lymphadenopathy Lungs/Pleura: Bandlike areas of subpleural reticulation along the bilateral chest wall compatible with prior radiation within the lung parenchyma. No pleural effusion. Airways are patent. Lungs are clear. Upper Abdomen: Incidental imaging of upper abdominal contents without acute process. Post cholecystectomy. Imaged portions of liver, pancreas, spleen and adrenal glands are unremarkable. Musculoskeletal: Spinal degenerative changes. No acute or destructive  bone finding. Two small nodules in the subcutaneous fat of the anterior RIGHT chest wall largest 5 mm  (image 38 of series 2) within 1 mm of previous size, in May of 2020, also seen in 2019 less distinct on the prior exams but the previous exam showed thicker axial slices. IMPRESSION: 1. No definitive evidence of metastatic disease to the chest. 2. Subtle nodularity along the anterior chest wall, indistinct and 5 mm size similar to the study of May of 2020 accounting for differences in slice thickness. Attention on follow-up 3. Postoperative changes in the chest wall as before. 4. Pleural and parenchymal changes along the anterior chest compatible with prior radiotherapy. 5. 3.8 cm ascending thoracic aorta unchanged from previous imaging. Attention on follow-up examinations. Electronically Signed   By: Zetta Bills M.D.   On: 08/05/2020 08:03     ASSESSMENT: 69 y.o.  [place], [place], woman  (1)  with a history of inflammatory right breast cancer metastatic at presentation September 2004 with involvement of liver and bone, HER-2 positive, estrogen and progesterone receptor negative  (2) treated with carboplatin, docetaxel and Herceptin x 6 completed April 2005  (3) trastuzumab continued indefinitely;   (a) has also received lapatinib and capecitabine for variable intervals in 2007-2008.  (b) Every 6 month echo: 09/24/2017 showed an ejection fraction in the 55-60%  (c) echocardiogram 03/07/2018 showed an ejection fraction in the 55-60%  (d) cardiogram 09/30/2018 showed ejection fraction in the 60-65% range.  (e) echocardiogram November 30, 2019 showed an ejection fraction in the 55-60% range  (f) echo 08/31/2020  (4) status post bilateral mastectomies with bilateral axillary lymph node dissection 12/07/2004, showing  (a) on the right, a mypT1c ypN1 invasive ductal carcinoma, grade 3, estrogen and progesterone receptor negative, HER-2 positive, with an MIB-1 of 31%  (b) on the left, ypT2 ypN1 invasive ductal carcinoma, grade 2, estrogen and progesterone receptor negative, HER-2 positive, with  an MIB-1 of 35%.  (5)  Status post radiation June through July of 2006, to the right chest wall, left chest wall, bilateral supraclavicular fossae, and bilateral axillary boosts; with additional radiation to the right and left chest walls and the central chest wall completed November of 2007  (6) status post Ixempra x9 completed August of 2009.  (7) history of superior vena caval syndrome, on life long anticoagulation   (8)  History of chemotherapy-induced neuropathy.   (9)  chronic pain, with negative PET scan 08/24/2013 (no evidence of active cancer). On Neurontin and Tramadol  (a) repeat PET scan December 2016 again negative.  (b) Repeat PET scan 04/23/2017 shows no active malignancy  (c) PET scan 07/09/2018 shows no evidence of active malignancy.  (d) PET scan 12/26/2018 shows no evidence of active disease  (e) chest CT 08/05/2020 shows no evidence of progressive disease  (10) right upper extremity cellulitis, no bacteremia; treated with cephalexin /doxycycline for 2 weeks, with resolution  (11) subacute fracture right humerus, nondisplaced, status post fall 03/27/2017   PLAN: Nella is now 17 years out from initial diagnosis of metastatic breast cancer.  There is no evidence of disease activity.  She continues on trastuzumab every 28 days.  She tolerates this well.  She is due for repeat echocardiography in early January.  She has mild anemia today.  I am not sure why this is but she does have significant bruising and the outer portion of her right arm and perhaps because of the Coumadin and bruising she has lost a little bit of blood.  The MCV is normal.  This requires only follow-up.  We reviewed her CT scan which is very favorable  She knows to call for any other issue that may develop before her next visit with me which will be in 3 months.  Of course we will continue the trastuzumab every 28 days indefinitely  Total encounter time 25 minutes.  Boyde Grieco, Virgie Dad, MD   08/11/20 9:22 AM Medical Oncology and Hematology [facility] [place], [place] [address] Tel. [phone]    Fax. [phone]   I, Wilburn Mylar, am acting as scribe for Dr. Virgie Dad. Aaryn Sermon.  I, Lurline Del MD, have reviewed the above documentation for accuracy and completeness, and I agree with the above.   *Total Encounter Time as defined by the Centers for Medicare and Medicaid Services includes, in addition to the face-to-face time of a patient visit (documented in the note above) non-face-to-face time: obtaining and reviewing outside history, ordering and reviewing medications, tests or procedures, care coordination (communications with other health care professionals or caregivers) and documentation in the medical record.

## 2020-08-11 ENCOUNTER — Other Ambulatory Visit: Payer: Self-pay

## 2020-08-11 ENCOUNTER — Inpatient Hospital Stay: Payer: Medicare Other | Attending: Oncology

## 2020-08-11 ENCOUNTER — Telehealth: Payer: Self-pay

## 2020-08-11 ENCOUNTER — Inpatient Hospital Stay (HOSPITAL_BASED_OUTPATIENT_CLINIC_OR_DEPARTMENT_OTHER): Payer: Medicare Other | Admitting: Oncology

## 2020-08-11 ENCOUNTER — Inpatient Hospital Stay: Payer: Medicare Other

## 2020-08-11 VITALS — BP 141/76 | HR 77 | Temp 97.8°F | Resp 18 | Ht 63.0 in | Wt 281.8 lb

## 2020-08-11 DIAGNOSIS — C50911 Malignant neoplasm of unspecified site of right female breast: Secondary | ICD-10-CM | POA: Diagnosis not present

## 2020-08-11 DIAGNOSIS — C50812 Malignant neoplasm of overlapping sites of left female breast: Secondary | ICD-10-CM | POA: Diagnosis not present

## 2020-08-11 DIAGNOSIS — Z7901 Long term (current) use of anticoagulants: Secondary | ICD-10-CM | POA: Diagnosis not present

## 2020-08-11 DIAGNOSIS — D649 Anemia, unspecified: Secondary | ICD-10-CM | POA: Insufficient documentation

## 2020-08-11 DIAGNOSIS — Z5112 Encounter for antineoplastic immunotherapy: Secondary | ICD-10-CM | POA: Insufficient documentation

## 2020-08-11 DIAGNOSIS — C50919 Malignant neoplasm of unspecified site of unspecified female breast: Secondary | ICD-10-CM

## 2020-08-11 DIAGNOSIS — Z171 Estrogen receptor negative status [ER-]: Secondary | ICD-10-CM

## 2020-08-11 DIAGNOSIS — C50012 Malignant neoplasm of nipple and areola, left female breast: Secondary | ICD-10-CM

## 2020-08-11 DIAGNOSIS — C50912 Malignant neoplasm of unspecified site of left female breast: Secondary | ICD-10-CM

## 2020-08-11 LAB — COMPREHENSIVE METABOLIC PANEL
ALT: 12 U/L (ref 0–44)
AST: 11 U/L — ABNORMAL LOW (ref 15–41)
Albumin: 3.4 g/dL — ABNORMAL LOW (ref 3.5–5.0)
Alkaline Phosphatase: 64 U/L (ref 38–126)
Anion gap: 10 (ref 5–15)
BUN: 22 mg/dL (ref 8–23)
CO2: 25 mmol/L (ref 22–32)
Calcium: 9 mg/dL (ref 8.9–10.3)
Chloride: 107 mmol/L (ref 98–111)
Creatinine, Ser: 0.92 mg/dL (ref 0.44–1.00)
GFR, Estimated: >60 mL/min (ref >60)
Glucose, Bld: 86 mg/dL (ref 70–99)
Potassium: 4.1 mmol/L (ref 3.5–5.1)
Sodium: 142 mmol/L (ref 135–145)
Total Bilirubin: 0.6 mg/dL (ref 0.3–1.2)
Total Protein: 7.2 g/dL (ref 6.5–8.1)

## 2020-08-11 LAB — CBC WITH DIFFERENTIAL/PLATELET
Abs Immature Granulocytes: 0.04 10*3/uL (ref 0.00–0.07)
Basophils Absolute: 0 10*3/uL (ref 0.0–0.1)
Basophils Relative: 1 %
Eosinophils Absolute: 0.2 10*3/uL (ref 0.0–0.5)
Eosinophils Relative: 2 %
HCT: 32.5 % — ABNORMAL LOW (ref 36.0–46.0)
Hemoglobin: 10.8 g/dL — ABNORMAL LOW (ref 12.0–15.0)
Immature Granulocytes: 1 %
Lymphocytes Relative: 28 %
Lymphs Abs: 2.5 10*3/uL (ref 0.7–4.0)
MCH: 29.3 pg (ref 26.0–34.0)
MCHC: 33.2 g/dL (ref 30.0–36.0)
MCV: 88.3 fL (ref 80.0–100.0)
Monocytes Absolute: 0.9 10*3/uL (ref 0.1–1.0)
Monocytes Relative: 10 %
Neutro Abs: 5.2 10*3/uL (ref 1.7–7.7)
Neutrophils Relative %: 58 %
Platelets: 240 10*3/uL (ref 150–400)
RBC: 3.68 MIL/uL — ABNORMAL LOW (ref 3.87–5.11)
RDW: 15.3 % (ref 11.5–15.5)
WBC: 8.8 10*3/uL (ref 4.0–10.5)
nRBC: 0 % (ref 0.0–0.2)

## 2020-08-11 LAB — PROTIME-INR
INR: 4.9 (ref 0.8–1.2)
Prothrombin Time: 44.6 seconds — ABNORMAL HIGH (ref 11.4–15.2)

## 2020-08-11 MED ORDER — DIPHENHYDRAMINE HCL 25 MG PO CAPS
ORAL_CAPSULE | ORAL | Status: AC
  Filled 2020-08-11: qty 1

## 2020-08-11 MED ORDER — SODIUM CHLORIDE 0.9 % IV SOLN
Freq: Once | INTRAVENOUS | Status: AC
Start: 2020-08-11 — End: 2020-08-11
  Filled 2020-08-11: qty 250

## 2020-08-11 MED ORDER — LORAZEPAM 2 MG/ML IJ SOLN
1.0000 mg | Freq: Once | INTRAMUSCULAR | Status: AC | PRN
  Administered 2020-08-11: 10:00:00 1 mg via INTRAVENOUS

## 2020-08-11 MED ORDER — SODIUM CHLORIDE 0.9% FLUSH
10.0000 mL | INTRAVENOUS | Status: DC | PRN
  Administered 2020-08-11: 12:00:00 10 mL
  Filled 2020-08-11: qty 10

## 2020-08-11 MED ORDER — HEPARIN SOD (PORK) LOCK FLUSH 100 UNIT/ML IV SOLN
500.0000 [IU] | Freq: Once | INTRAVENOUS | Status: AC | PRN
  Administered 2020-08-11: 12:00:00 500 [IU]
  Filled 2020-08-11: qty 5

## 2020-08-11 MED ORDER — LORAZEPAM 2 MG/ML IJ SOLN
INTRAMUSCULAR | Status: AC
  Filled 2020-08-11: qty 1

## 2020-08-11 MED ORDER — DIPHENHYDRAMINE HCL 25 MG PO CAPS
25.0000 mg | ORAL_CAPSULE | Freq: Once | ORAL | Status: AC
  Administered 2020-08-11: 10:00:00 25 mg via ORAL

## 2020-08-11 MED ORDER — ACETAMINOPHEN 325 MG PO TABS
650.0000 mg | ORAL_TABLET | Freq: Once | ORAL | Status: AC
  Administered 2020-08-11: 10:00:00 650 mg via ORAL

## 2020-08-11 MED ORDER — TRASTUZUMAB-DKST CHEMO 150 MG IV SOLR
750.0000 mg | Freq: Once | INTRAVENOUS | Status: AC
  Administered 2020-08-11: 11:00:00 750 mg via INTRAVENOUS
  Filled 2020-08-11: qty 35.72

## 2020-08-11 MED ORDER — ACETAMINOPHEN 325 MG PO TABS
ORAL_TABLET | ORAL | Status: AC
  Filled 2020-08-11: qty 2

## 2020-08-11 NOTE — Progress Notes (Unsigned)
INR 4.9 CALLED TO KAREN KENNEDY,RN AT 1114 08/11/20 BY P.Ireland Virrueta, [place]

## 2020-08-11 NOTE — Telephone Encounter (Signed)
TC to Pt. In reference to critical lab (INR 4.9) Pt currently taking 7.5 mg of coumadin M-W-F and T- and F 5 mg. Informed Pt. That per Dr. Jana Hakim she is to change dose to 5 mg a day. And when she comes in for next scheduled visit she will get her labs drawn. Pt. Verbalized understanding. No further problems or concerns noted.

## 2020-08-11 NOTE — Progress Notes (Signed)
Okay to treat with trastuzumab with echo from 4/21 per Dr. Jana Hakim

## 2020-08-11 NOTE — Telephone Encounter (Signed)
CRITICAL VALUE STICKER  CRITICAL VALUE: INR 4.9  RECEIVER (on-site recipient of call): Lenox Ponds LPN  DATE & TIME NOTIFIED: 1118  MESSENGER (representative from lab):[facility]   MD NOTIFIED: Dr. Jana Hakim   TIME OF NOTIFICATION: 6644  RESPONSE: Change in Pt's dose of coumadin

## 2020-08-15 ENCOUNTER — Other Ambulatory Visit: Payer: Self-pay | Admitting: Oncology

## 2020-08-15 ENCOUNTER — Telehealth: Payer: Self-pay | Admitting: Oncology

## 2020-08-15 NOTE — Telephone Encounter (Signed)
No 12/16 los. No changes made to pt's schedule.

## 2020-08-16 ENCOUNTER — Telehealth: Payer: Self-pay | Admitting: Oncology

## 2020-08-16 NOTE — Telephone Encounter (Signed)
Scheduled appt per 12/20 sch msg - pt is aware of appts scheduled appt to March 2022 - pt states she will stop by scheduling to schedule the rest at her next visit.

## 2020-08-23 ENCOUNTER — Other Ambulatory Visit: Payer: Self-pay | Admitting: Oncology

## 2020-08-30 ENCOUNTER — Other Ambulatory Visit: Payer: Self-pay

## 2020-08-30 ENCOUNTER — Other Ambulatory Visit (HOSPITAL_COMMUNITY): Payer: Self-pay

## 2020-08-30 ENCOUNTER — Ambulatory Visit (HOSPITAL_COMMUNITY): Admission: RE | Admit: 2020-08-30 | Payer: Medicare Other | Source: Ambulatory Visit | Admitting: Cardiology

## 2020-08-30 ENCOUNTER — Ambulatory Visit (HOSPITAL_COMMUNITY): Admission: RE | Admit: 2020-08-30 | Payer: Medicare Other | Source: Ambulatory Visit

## 2020-08-31 ENCOUNTER — Ambulatory Visit (HOSPITAL_COMMUNITY)
Admission: RE | Admit: 2020-08-31 | Discharge: 2020-08-31 | Disposition: A | Discharge status: Home / Self Care | Payer: Medicare Other | Source: Ambulatory Visit | Attending: Cardiology | Admitting: Cardiology

## 2020-08-31 DIAGNOSIS — Z853 Personal history of malignant neoplasm of breast: Secondary | ICD-10-CM | POA: Diagnosis not present

## 2020-08-31 DIAGNOSIS — I5022 Chronic systolic (congestive) heart failure: Secondary | ICD-10-CM | POA: Insufficient documentation

## 2020-08-31 DIAGNOSIS — I110 Hypertensive heart disease with heart failure: Secondary | ICD-10-CM | POA: Insufficient documentation

## 2020-08-31 LAB — ECHOCARDIOGRAM COMPLETE
Area-P 1/2: 3.37 cm2
S' Lateral: 2.9 cm

## 2020-08-31 NOTE — Progress Notes (Signed)
  Echocardiogram 2D Echocardiogram has been performed.  Jonte Shiller G Kadarius Cuffe 08/31/2020, 10:31 AM

## 2020-09-08 ENCOUNTER — Inpatient Hospital Stay: Payer: Medicare Other | Attending: Oncology

## 2020-09-08 ENCOUNTER — Inpatient Hospital Stay: Payer: Medicare Other

## 2020-09-08 ENCOUNTER — Other Ambulatory Visit: Payer: Self-pay

## 2020-09-08 VITALS — BP 123/69 | HR 70 | Temp 99.5°F | Resp 18

## 2020-09-08 DIAGNOSIS — C50911 Malignant neoplasm of unspecified site of right female breast: Secondary | ICD-10-CM | POA: Diagnosis not present

## 2020-09-08 DIAGNOSIS — C50012 Malignant neoplasm of nipple and areola, left female breast: Secondary | ICD-10-CM

## 2020-09-08 DIAGNOSIS — Z5112 Encounter for antineoplastic immunotherapy: Secondary | ICD-10-CM | POA: Insufficient documentation

## 2020-09-08 DIAGNOSIS — C50919 Malignant neoplasm of unspecified site of unspecified female breast: Secondary | ICD-10-CM

## 2020-09-08 DIAGNOSIS — C50812 Malignant neoplasm of overlapping sites of left female breast: Secondary | ICD-10-CM | POA: Insufficient documentation

## 2020-09-08 DIAGNOSIS — C50011 Malignant neoplasm of nipple and areola, right female breast: Secondary | ICD-10-CM

## 2020-09-08 DIAGNOSIS — Z7901 Long term (current) use of anticoagulants: Secondary | ICD-10-CM | POA: Diagnosis not present

## 2020-09-08 LAB — CBC WITH DIFFERENTIAL/PLATELET
Abs Immature Granulocytes: 0.02 10*3/uL (ref 0.00–0.07)
Basophils Absolute: 0 10*3/uL (ref 0.0–0.1)
Basophils Relative: 1 %
Eosinophils Absolute: 0.1 10*3/uL (ref 0.0–0.5)
Eosinophils Relative: 1 %
HCT: 35 % — ABNORMAL LOW (ref 36.0–46.0)
Hemoglobin: 12.1 g/dL (ref 12.0–15.0)
Immature Granulocytes: 0 %
Lymphocytes Relative: 17 %
Lymphs Abs: 1.4 10*3/uL (ref 0.7–4.0)
MCH: 29.1 pg (ref 26.0–34.0)
MCHC: 34.6 g/dL (ref 30.0–36.0)
MCV: 84.1 fL (ref 80.0–100.0)
Monocytes Absolute: 0.4 10*3/uL (ref 0.1–1.0)
Monocytes Relative: 5 %
Neutro Abs: 6.6 10*3/uL (ref 1.7–7.7)
Neutrophils Relative %: 76 %
Platelets: 267 10*3/uL (ref 150–400)
RBC: 4.16 MIL/uL (ref 3.87–5.11)
RDW: 13.7 % (ref 11.5–15.5)
WBC: 8.5 10*3/uL (ref 4.0–10.5)
nRBC: 0 % (ref 0.0–0.2)

## 2020-09-08 LAB — COMPREHENSIVE METABOLIC PANEL
ALT: 10 U/L (ref 0–44)
AST: 11 U/L — ABNORMAL LOW (ref 15–41)
Albumin: 3.6 g/dL (ref 3.5–5.0)
Alkaline Phosphatase: 74 U/L (ref 38–126)
Anion gap: 7 (ref 5–15)
BUN: 15 mg/dL (ref 8–23)
CO2: 25 mmol/L (ref 22–32)
Calcium: 9.6 mg/dL (ref 8.9–10.3)
Chloride: 107 mmol/L (ref 98–111)
Creatinine, Ser: 0.94 mg/dL (ref 0.44–1.00)
GFR, Estimated: >60 mL/min (ref >60)
Glucose, Bld: 117 mg/dL — ABNORMAL HIGH (ref 70–99)
Potassium: 4.3 mmol/L (ref 3.5–5.1)
Sodium: 139 mmol/L (ref 135–145)
Total Bilirubin: 0.6 mg/dL (ref 0.3–1.2)
Total Protein: 8 g/dL (ref 6.5–8.1)

## 2020-09-08 LAB — PROTIME-INR
INR: 1.1 (ref 0.8–1.2)
Prothrombin Time: 13.6 seconds (ref 11.4–15.2)

## 2020-09-08 MED ORDER — DIPHENHYDRAMINE HCL 25 MG PO CAPS
ORAL_CAPSULE | ORAL | Status: AC
  Filled 2020-09-08: qty 1

## 2020-09-08 MED ORDER — LORAZEPAM 2 MG/ML IJ SOLN
1.0000 mg | Freq: Once | INTRAMUSCULAR | Status: AC | PRN
  Administered 2020-09-08: 1 mg via INTRAVENOUS

## 2020-09-08 MED ORDER — SODIUM CHLORIDE 0.9 % IV SOLN
Freq: Once | INTRAVENOUS | Status: AC
  Filled 2020-09-08: qty 250

## 2020-09-08 MED ORDER — TRASTUZUMAB-DKST CHEMO 150 MG IV SOLR
750.0000 mg | Freq: Once | INTRAVENOUS | Status: AC
  Administered 2020-09-08: 750 mg via INTRAVENOUS
  Filled 2020-09-08: qty 35.72

## 2020-09-08 MED ORDER — HEPARIN SOD (PORK) LOCK FLUSH 100 UNIT/ML IV SOLN
500.0000 [IU] | Freq: Once | INTRAVENOUS | Status: AC | PRN
  Administered 2020-09-08: 500 [IU]
  Filled 2020-09-08: qty 5

## 2020-09-08 MED ORDER — SODIUM CHLORIDE 0.9% FLUSH
10.0000 mL | INTRAVENOUS | Status: DC | PRN
  Administered 2020-09-08: 10 mL
  Filled 2020-09-08: qty 10

## 2020-09-08 MED ORDER — DIPHENHYDRAMINE HCL 25 MG PO CAPS
25.0000 mg | ORAL_CAPSULE | Freq: Once | ORAL | Status: AC
  Administered 2020-09-08: 25 mg via ORAL

## 2020-09-08 MED ORDER — ACETAMINOPHEN 325 MG PO TABS
650.0000 mg | ORAL_TABLET | Freq: Once | ORAL | Status: AC
Start: 2020-09-08 — End: 2020-09-08
  Administered 2020-09-08: 650 mg via ORAL

## 2020-09-08 MED ORDER — ACETAMINOPHEN 325 MG PO TABS
ORAL_TABLET | ORAL | Status: AC
  Filled 2020-09-08: qty 2

## 2020-09-08 MED ORDER — LORAZEPAM 2 MG/ML IJ SOLN
INTRAMUSCULAR | Status: AC
  Filled 2020-09-08: qty 1

## 2020-09-08 NOTE — Patient Instructions (Signed)
Cancer Center Discharge Instructions for Patients Receiving Chemotherapy  Today you received the following chemotherapy agents: Trastuzumab   To help prevent nausea and vomiting after your treatment, we encourage you to take your nausea medication  as prescribed.    If you develop nausea and vomiting that is not controlled by your nausea medication, call the clinic.   BELOW ARE SYMPTOMS THAT SHOULD BE REPORTED IMMEDIATELY:  *FEVER GREATER THAN 100.5 F  *CHILLS WITH OR WITHOUT FEVER  NAUSEA AND VOMITING THAT IS NOT CONTROLLED WITH YOUR NAUSEA MEDICATION  *UNUSUAL SHORTNESS OF BREATH  *UNUSUAL BRUISING OR BLEEDING  TENDERNESS IN MOUTH AND THROAT WITH OR WITHOUT PRESENCE OF ULCERS  *URINARY PROBLEMS  *BOWEL PROBLEMS  UNUSUAL RASH Items with * indicate a potential emergency and should be followed up as soon as possible.  Feel free to call the clinic should you have any questions or concerns. The clinic phone number is [phone].  Please show the CHEMO ALERT CARD at check-in to the Emergency Department and triage nurse.   

## 2020-09-27 ENCOUNTER — Other Ambulatory Visit: Payer: Self-pay | Admitting: *Deleted

## 2020-09-27 DIAGNOSIS — C50919 Malignant neoplasm of unspecified site of unspecified female breast: Secondary | ICD-10-CM

## 2020-09-27 MED ORDER — TEMAZEPAM 30 MG PO CAPS: ORAL_CAPSULE | ORAL | 2 refills | Status: DC

## 2020-09-27 MED ORDER — ALPRAZOLAM 1 MG PO TABS: 1.0000 mg | ORAL_TABLET | Freq: Three times a day (TID) | ORAL | 1 refills | Status: DC | PRN

## 2020-10-04 ENCOUNTER — Telehealth: Payer: Self-pay | Admitting: Oncology

## 2020-10-04 NOTE — Telephone Encounter (Signed)
Called pt per 2/8 sch msg - no answer. Left message for pt that Thursday schedule was booked - appton 2/9 kept the same

## 2020-10-05 ENCOUNTER — Inpatient Hospital Stay: Payer: Medicare Other

## 2020-10-05 ENCOUNTER — Inpatient Hospital Stay: Payer: Medicare Other | Admitting: Oncology

## 2020-10-07 ENCOUNTER — Telehealth: Payer: Self-pay | Admitting: Oncology

## 2020-10-07 NOTE — Telephone Encounter (Signed)
R/s appt per 2/08 sch msg - pt is aware of new appt on 2/17

## 2020-10-12 DIAGNOSIS — Z5181 Encounter for therapeutic drug level monitoring: Secondary | ICD-10-CM | POA: Diagnosis not present

## 2020-10-12 DIAGNOSIS — R61 Generalized hyperhidrosis: Secondary | ICD-10-CM | POA: Diagnosis not present

## 2020-10-12 DIAGNOSIS — I10 Essential (primary) hypertension: Secondary | ICD-10-CM | POA: Diagnosis not present

## 2020-10-12 DIAGNOSIS — M170 Bilateral primary osteoarthritis of knee: Secondary | ICD-10-CM | POA: Diagnosis not present

## 2020-10-12 NOTE — Progress Notes (Signed)
[facility]  [phone] [phone]    ID: ASEES MANFREDI   DOB: Feb 13, 1951  MR#: [id number]  ZJI#:[id number]   Patient Care Team: Ernestene Kiel, MD as PCP - General (Internal Medicine) Haze Rushing., MD as Consulting Physician (Pain Medicine) Joya Salm, MD as Referring Physician (Orthopedic Surgery) Jaymes Graff, DO as Consulting Physician (Orthopedic Surgery) Larey Dresser, MD as Consulting Physician (Cardiology) Bensimhon, Shaune Pascal, MD as Consulting Physician (Cardiology) Lorelle Gibbs, MD (Radiology) Tommy Medal, Lavell Islam, MD as Consulting Physician (Infectious Diseases) Gery Pray, MD as Consulting Physician (Radiation Oncology) Stephone Gum, Virgie Dad, MD as Consulting Physician (Oncology)   CHIEF COMPLAINT: Metastatic HER-2 positive, estrogen receptor negative breast cancer (s/p bilateral mastectomies)  CURRENT TREATMENT: Trastuzumab every 28 days; lifelong warfarin   INTERVAL HISTORY: Brodi returns today for follow-up and treatment of her estrogen receptor negative, but HER2 amplified breast cancer.   She continues on trastuzumab, which she receives every 28 days.  She has absolutely no side effects related to this that she is aware of.    Since her last visit, she underwent repeat echocardiogram on 08/31/2020 showing an ejection fraction of 60-65%.  She is on lifelong warfarin. She tolerates this well and without any noticeable side effects.  INR today is pending   REVIEW OF SYSTEMS: Essa tells me everything is stable. She continues to be the primary caregiver for her husband who is still adjusting to his amputation. She says the rest of the family is doing "too well". She is not exercising regularly. A detailed review of systems today was otherwise stable   COVID 19 VACCINATION STATUS: fully vaccinated (Moderna); infection 04/08/2020; booster late September 2021   BREAST CANCER HISTORY:   From the earlier  summary:  Ladaija Dimino is 70 years old [name] female.  This woman has been in good health all of her life.  She noted a swelling and discomfort in her right breast in June 2004.  She was seen in the Emergency Room in [place] and was treated for mastitis.  She was treated for a number of months with mastitis and the swelling did not get better. She was given hydrocodone and Cipro.  Finally, the swelling did get better and ultimately the nipple became retracted and she noticed some dimpling in her skin. She had a mammogram in July of 2004 in [place] with subsequent mammogram on May 27, 2003, by Dr. Isaiah Blakes.  Mammogram done on September 30 showed marked increased density in the left breast.  Biopsy was performed the same day.  It was noted at the 12 o'clock position, deep in the breast was a focal hypoechoic mass, at least 3.5 cm in diameter.  Biopsy did in fact show invasive in situ mammary carcinoma. This was felt to be both at least intermediate, high grade.  No definite lymphovascular invasion was identified. ER and PR negative, Her2 testing positive. Wana continues to have pain in her breast.  She continues to take hydrocodone a number of times a day.  She has been seen by Dr. Rosana Hoes, who felt that neoadjuvant chemotherapy would be required.    Initial staging studies showed evidence of liver and lung mets.   Patient also has evidence of bone lesions. Patient started neoadjuvant chemotherapy, Taxotere/Carbo/Herceptin in October 2004.   Patient had a CT scan in December 2004 which demonstrated extensive clot in the SVC innominate vein, bilateral jugular vein and  She was started on anticoagulation therapy. She received a total  of 6 cycles of Taxotere/Carbo/Herceptin, completed in April 2005."  Patient has been on  trastuzumab continued indefinitely; has also received lapatinib and capecitabine for variable intervals in 2007-2008. Most recent echo 12/01/2013 showed an ejection fraction of 55%.  She is status post bilateral mastectomies with bilateral axillary lymph node dissection 12/07/2004, showing (a) on the right, a mypT1c ypN1 invasive ductal carcinoma, grade 3, estrogen and progesterone receptor negative, HER-2 positive, with an MIB-1 of 31% (b) on the left, ypT2 ypN1 invasive ductal carcinoma, grade 2, estrogen and progesterone receptor negative, HER-2 positive, with an MIB-1 of 35%. She is Status post radiation June through July of 2006, to the right chest wall, left chest wall, bilateral supraclavicular fossae, and bilateral axillary boosts; with additional radiation to the right and left chest walls and the central chest wall completed November of 2007. She is status post ixempra x9 completed August of 2009. 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 Patient has chronic pain, with negative PET scan 08/24/2013 (no evidence of active cancer). On Neurontin and Tramadol therapy.  Her subsequent history is as detailed below   PAST MEDICAL HISTORY: Past Medical History:  Diagnosis Date  . Breast cancer ([facility])    mets to liver and lung  . Breast cancer metastasized to multiple sites ([facility]) 02/26/2013  . History of chemotherapy Feb. 2006   taxotere/herceptin/carboplatin  . Hypertension   . Neuropathy   . Radiation 07/31/2006   left upper chest  . Radiation 06/17/2006-06/27/2006   6480 cGy bilat. chest wall  . SVC syndrome   . Thrombosis     PAST SURGICAL HISTORY: Past Surgical History:  Procedure Laterality Date  . ANKLE SURGERY    . BACK SURGERY    . CHOLECYSTECTOMY  1989  . MASTECTOMY Bilateral   . PERIPHERALLY INSERTED CENTRAL CATHETER INSERTION    . TUBAL LIGATION  1986    FAMILY HISTORY Family History  Problem Relation Age of Onset  . Heart failure Father   . Cancer Father        Prostate cancer  . Heart failure Brother   . Cancer Brother        Prostate cancer  . Diabetes  Maternal Aunt   She had three brothers, one died of gunshot wound, one of complications of diabetes mellitus and one of myocardial infarction.  She has no sisters.  Mother died of complications of brain metastasis in 32.  Father has had a myocardial infarction in 1999.  No history of breast or ovarian cancer in the family.     GYNECOLOGIC HISTORY:    No LMP recorded. Patient is postmenopausal. Menarche at age 75.   Gravida 3, para 3.   First live birth at age 38.   No history of breast feeding. No history of hormonal replacement therapy.    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 He used to work as a Art gallery manager, but is now retired and disabled. The patient has three children, Monette who lives in [place] and works as a Hydrographic surveyor, Financial risk analyst who lives in [place] and works as a Administrator, and [place] who lives in [place] and also works as a Hydrographic surveyor. The patient has 14 grandchildren and 4 great-grandchildren. She attends a [employer].   ADVANCED DIRECTIVES: In the absence  of any documentation to the contrary, the patient's spouse is their HCPOA.    HEALTH MAINTENANCE:  Social History   Tobacco Use  . Smoking status: Never Smoker  . Smokeless tobacco: Never Used  Substance Use Topics  . Alcohol use: Yes    Comment: occasional  . Drug use: No     Colonoscopy: Never and "I don't want one"  PAP:  1987  Bone density:  Never   Lipid panel:  Not on file   Allergies  Allergen Reactions  . Penicillins Hives    PATIENT HAS TOLERATED CEPHALOSPORINGS  . Adhesive [Tape] Other (See Comments)    Tears skin     Current Outpatient Medications  Medication Sig Dispense Refill  . acetaminophen (TYLENOL) 500 MG tablet Take 1,000 mg by mouth every 6 (six) hours as needed for mild pain or fever.     Marland Kitchen albuterol  (PROVENTIL HFA;VENTOLIN HFA) 108 (90 BASE) MCG/ACT inhaler Inhale 2 puffs into the lungs every 6 (six) hours as needed for wheezing. 1 Inhaler 5  . ALPRAZolam (XANAX) 1 MG tablet Take 1 tablet (1 mg total) by mouth 3 (three) times daily as needed. for anxiety 60 tablet 1  . amLODipine (NORVASC) 10 MG tablet Take 1 tablet (10 mg total) by mouth every morning. 30 tablet 4  . baclofen (LIORESAL) 10 MG tablet TAKE 1 TABLET BY MOUTH THREE TIMES DAILY AS NEEDED FOR MUSCLE SPASMS 270 tablet 0  . carvedilol (COREG) 12.5 MG tablet TAKE 1 TABLET(12.5 MG) BY MOUTH TWICE DAILY 60 tablet 6  . diclofenac sodium (VOLTAREN) 1 % GEL Apply 2 g topically daily as needed (for pain). Apply to knees and shoulders 100 g 6  . furosemide (LASIX) 40 MG tablet TAKE 1 TABLET(40 MG) BY MOUTH DAILY 30 tablet 3  . gabapentin (NEURONTIN) 300 MG capsule TAKE 2 CAPSULES(600 MG) BY MOUTH THREE TIMES DAILY 540 capsule 0  . losartan (COZAAR) 100 MG tablet Take 100 mg by mouth every morning.    . mupirocin ointment (BACTROBAN) 2 % APP EXT TO SORE ON FOOT TID FOR 5 DAYS    . oxyCODONE-acetaminophen (PERCOCET) 10-325 MG tablet Take 1 tablet by mouth 4 (four) times daily as needed.    . potassium chloride (K-DUR,KLOR-CON) 20 MEQ tablet Take 1 tablet (20 mEq total) by mouth daily. 30 tablet 3  . predniSONE (DELTASONE) 5 MG tablet     . spironolactone (ALDACTONE) 25 MG tablet TAKE 1 TABLET(25 MG) BY MOUTH DAILY 30 tablet 11  . temazepam (RESTORIL) 30 MG capsule TAKE ONE CAPSULE BY MOUTH EVERY NIGHT AT BEDTIME AS NEEDED FOR SLEEP 30 capsule 2  . warfarin (COUMADIN) 5 MG tablet TAKE 1 TABLET BY MOUTH ON ODD NUMBERED DAYS PLUS 1 AND 1/2 TABLETS ON EVEN NUMBERED DAYS 90 tablet 4   No current facility-administered medications for this visit.   Facility-Administered Medications Ordered in Other Visits  Medication Dose Route Frequency Provider Last Rate Last Admin  . sodium chloride flush (NS) 0.9 % injection 10 mL  10 mL Intravenous PRN  Ronalda Walpole, Virgie Dad, MD   10 mL at 12/15/15 1200  . sodium chloride flush (NS) 0.9 % injection 10 mL  10 mL Intracatheter PRN Opie Fanton, Virgie Dad, MD   10 mL at 08/14/18 1116  . sodium chloride flush (NS) 0.9 % injection 10 mL  10 mL Intracatheter PRN Kassia Demarinis, Virgie Dad, MD        OBJECTIVE: African-American woman in no acute distress  Vitals:  10/13/20 0835  BP: (!) 152/75  Pulse: 72  Resp: 18  Temp: (!) 97 F (36.1 C)  SpO2: 97%     Body mass index is 49.53 kg/m.    ECOG FS: 2 Filed Weights   10/13/20 0835  Weight: 279 lb 9.6 oz (126.8 kg)    Sclerae unicteric, EOMs intact Wearing a mask No cervical or supraclavicular adenopathy Lungs no rales or rhonchi Heart regular rate and rhythm Abd soft, nontender, positive bowel sounds MSK no focal spinal tenderness, no upper extremity lymphedema Neuro: nonfocal, well oriented, appropriate affect Breasts: Status post bilateral mastectomies. No evidence of chest wall recurrence   LAB RESULTS: Lab Results  Component Value Date   WBC 9.7 10/13/2020   NEUTROABS 5.9 10/13/2020   HGB 11.9 (L) 10/13/2020   HCT 34.6 (L) 10/13/2020   MCV 84.0 10/13/2020   PLT 236 10/13/2020      Chemistry      Component Value Date/Time   NA 139 09/08/2020 0808   NA 143 08/14/2017 0811   K 4.3 09/08/2020 0808   K 3.8 08/14/2017 0811   CL 107 09/08/2020 0808   CL 104 01/30/2013 0850   CO2 25 09/08/2020 0808   CO2 24 08/14/2017 0811   BUN 15 09/08/2020 0808   BUN 14.9 08/14/2017 0811   CREATININE 0.94 09/08/2020 0808   CREATININE 0.87 05/18/2020 0846   CREATININE 0.9 08/14/2017 0811      Component Value Date/Time   CALCIUM 9.6 09/08/2020 0808   CALCIUM 9.4 08/14/2017 0811   ALKPHOS 74 09/08/2020 0808   ALKPHOS 73 08/14/2017 0811   AST 11 (L) 09/08/2020 0808   AST 10 (L) 05/18/2020 0846   AST 10 08/14/2017 0811   ALT 10 09/08/2020 0808   ALT 9 05/18/2020 0846   ALT 10 08/14/2017 0811   BILITOT 0.6 09/08/2020 0808   BILITOT 0.6  05/18/2020 0846   BILITOT 0.62 08/14/2017 0811      STUDIES: No results found.   ASSESSMENT: 70 y.o.  [place], [place], woman  (1)  with a history of inflammatory right breast cancer metastatic at presentation September 2004 with involvement of liver and bone, HER-2 positive, estrogen and progesterone receptor negative  (2) treated with carboplatin, docetaxel and Herceptin x 6 completed April 2005  (3) trastuzumab continued indefinitely;   (a) has also received lapatinib and capecitabine for variable intervals in 2007-2008.  (b) Every 6 month echo: 09/24/2017 showed an ejection fraction in the 55-60%  (c) echocardiogram 03/07/2018 showed an ejection fraction in the 55-60%  (d) cardiogram 09/30/2018 showed ejection fraction in the 60-65% range.  (e) echocardiogram November 30, 2019 showed an ejection fraction in the 55-60% range  (f) echo 08/31/2020  (4) status post bilateral mastectomies with bilateral axillary lymph node dissection 12/07/2004, showing  (a) on the right, a mypT1c ypN1 invasive ductal carcinoma, grade 3, estrogen and progesterone receptor negative, HER-2 positive, with an MIB-1 of 31%  (b) on the left, ypT2 ypN1 invasive ductal carcinoma, grade 2, estrogen and progesterone receptor negative, HER-2 positive, with an MIB-1 of 35%.  (5)  Status post radiation June through July of 2006, to the right chest wall, left chest wall, bilateral supraclavicular fossae, and bilateral axillary boosts; with additional radiation to the right and left chest walls and the central chest wall completed November of 2007  (6) status post Ixempra x9 completed August of 2009.  (7) history of superior vena caval syndrome, on life long anticoagulation   (8)  History of chemotherapy-induced neuropathy.   (9)  chronic pain, with negative PET scan 08/24/2013 (no evidence of active cancer). On Neurontin and Tramadol  (a) repeat PET scan December 2016 again negative.  (b) Repeat PET scan  04/23/2017 shows no active malignancy  (c) PET scan 07/09/2018 shows no evidence of active malignancy.  (d) PET scan 12/26/2018 shows no evidence of active disease  (e) chest CT 08/05/2020 shows no evidence of progressive disease  (10) right upper extremity cellulitis, no bacteremia; treated with cephalexin /doxycycline for 2 weeks, with resolution  (11) subacute fracture right humerus, nondisplaced, status post fall 03/27/2017   PLAN: Malaak is now 17-1/2 years out from initial diagnosis of metastatic breast cancer with no evidence of disease activity. This is very favorable.  We do not have data to stop her Herceptin and so we are continuing indefinitely, every 28 days.  She had an echocardiogram in January. That showed a fine ejection fraction. That will be repeated in July.  I am seeing her every 3 months. She knows to call for any other issue that may develop before the next visit  I was sorry to hear that her niece in [place] has had a breast cancer recurrence.  Total encounter time 20 minutes.*  Aedyn Kempfer, Virgie Dad, MD  10/13/20 9:00 AM Medical Oncology and Hematology [facility] [place], [place] [address] Tel. [phone]    Fax. [phone]   I, Wilburn Mylar, am acting as scribe for Dr. Virgie Dad. Parveen Freehling.  I, Lurline Del MD, have reviewed the above documentation for accuracy and completeness, and I agree with the above.   *Total Encounter Time as defined by the Centers for Medicare and Medicaid Services includes, in addition to the face-to-face time of a patient visit (documented in the note above) non-face-to-face time: obtaining and reviewing outside history, ordering and reviewing medications, tests or procedures, care coordination (communications with other health care professionals or caregivers) and documentation in the medical record.

## 2020-10-13 ENCOUNTER — Telehealth: Payer: Self-pay | Admitting: Oncology

## 2020-10-13 ENCOUNTER — Other Ambulatory Visit: Payer: Self-pay

## 2020-10-13 ENCOUNTER — Inpatient Hospital Stay: Payer: Medicare Other | Attending: Oncology

## 2020-10-13 ENCOUNTER — Inpatient Hospital Stay (HOSPITAL_BASED_OUTPATIENT_CLINIC_OR_DEPARTMENT_OTHER): Payer: Medicare Other | Admitting: Oncology

## 2020-10-13 ENCOUNTER — Inpatient Hospital Stay: Payer: Medicare Other

## 2020-10-13 VITALS — BP 152/75 | HR 72 | Temp 97.0°F | Resp 18 | Ht 63.0 in | Wt 279.6 lb

## 2020-10-13 DIAGNOSIS — Z171 Estrogen receptor negative status [ER-]: Secondary | ICD-10-CM

## 2020-10-13 DIAGNOSIS — C50812 Malignant neoplasm of overlapping sites of left female breast: Secondary | ICD-10-CM | POA: Insufficient documentation

## 2020-10-13 DIAGNOSIS — I10 Essential (primary) hypertension: Secondary | ICD-10-CM | POA: Diagnosis not present

## 2020-10-13 DIAGNOSIS — C50911 Malignant neoplasm of unspecified site of right female breast: Secondary | ICD-10-CM | POA: Diagnosis not present

## 2020-10-13 DIAGNOSIS — Z5112 Encounter for antineoplastic immunotherapy: Secondary | ICD-10-CM | POA: Diagnosis not present

## 2020-10-13 DIAGNOSIS — Z7901 Long term (current) use of anticoagulants: Secondary | ICD-10-CM | POA: Insufficient documentation

## 2020-10-13 DIAGNOSIS — C50919 Malignant neoplasm of unspecified site of unspecified female breast: Secondary | ICD-10-CM

## 2020-10-13 DIAGNOSIS — C50012 Malignant neoplasm of nipple and areola, left female breast: Secondary | ICD-10-CM

## 2020-10-13 DIAGNOSIS — C50011 Malignant neoplasm of nipple and areola, right female breast: Secondary | ICD-10-CM | POA: Diagnosis not present

## 2020-10-13 LAB — CBC WITH DIFFERENTIAL/PLATELET
Abs Immature Granulocytes: 0.04 10*3/uL (ref 0.00–0.07)
Basophils Absolute: 0 10*3/uL (ref 0.0–0.1)
Basophils Relative: 0 %
Eosinophils Absolute: 0.2 10*3/uL (ref 0.0–0.5)
Eosinophils Relative: 2 %
HCT: 34.6 % — ABNORMAL LOW (ref 36.0–46.0)
Hemoglobin: 11.9 g/dL — ABNORMAL LOW (ref 12.0–15.0)
Immature Granulocytes: 0 %
Lymphocytes Relative: 29 %
Lymphs Abs: 2.8 10*3/uL (ref 0.7–4.0)
MCH: 28.9 pg (ref 26.0–34.0)
MCHC: 34.4 g/dL (ref 30.0–36.0)
MCV: 84 fL (ref 80.0–100.0)
Monocytes Absolute: 0.8 10*3/uL (ref 0.1–1.0)
Monocytes Relative: 8 %
Neutro Abs: 5.9 10*3/uL (ref 1.7–7.7)
Neutrophils Relative %: 61 %
Platelets: 236 10*3/uL (ref 150–400)
RBC: 4.12 MIL/uL (ref 3.87–5.11)
RDW: 14.3 % (ref 11.5–15.5)
WBC: 9.7 10*3/uL (ref 4.0–10.5)
nRBC: 0 % (ref 0.0–0.2)

## 2020-10-13 LAB — COMPREHENSIVE METABOLIC PANEL
ALT: 13 U/L (ref 0–44)
AST: 11 U/L — ABNORMAL LOW (ref 15–41)
Albumin: 3.4 g/dL — ABNORMAL LOW (ref 3.5–5.0)
Alkaline Phosphatase: 76 U/L (ref 38–126)
Anion gap: 9 (ref 5–15)
BUN: 18 mg/dL (ref 8–23)
CO2: 25 mmol/L (ref 22–32)
Calcium: 9.1 mg/dL (ref 8.9–10.3)
Chloride: 107 mmol/L (ref 98–111)
Creatinine, Ser: 0.92 mg/dL (ref 0.44–1.00)
GFR, Estimated: >60 mL/min (ref >60)
Glucose, Bld: 97 mg/dL (ref 70–99)
Potassium: 4.3 mmol/L (ref 3.5–5.1)
Sodium: 141 mmol/L (ref 135–145)
Total Bilirubin: 0.8 mg/dL (ref 0.3–1.2)
Total Protein: 7.3 g/dL (ref 6.5–8.1)

## 2020-10-13 LAB — PROTIME-INR
INR: 3.2 — ABNORMAL HIGH (ref 0.8–1.2)
Prothrombin Time: 31.9 seconds — ABNORMAL HIGH (ref 11.4–15.2)

## 2020-10-13 MED ORDER — SODIUM CHLORIDE 0.9 % IV SOLN
750.0000 mg | Freq: Once | INTRAVENOUS | Status: AC
  Administered 2020-10-13: 750 mg via INTRAVENOUS
  Filled 2020-10-13: qty 35.72

## 2020-10-13 MED ORDER — DIPHENHYDRAMINE HCL 25 MG PO CAPS
ORAL_CAPSULE | ORAL | Status: AC
  Filled 2020-10-13: qty 1

## 2020-10-13 MED ORDER — DIPHENHYDRAMINE HCL 25 MG PO CAPS
25.0000 mg | ORAL_CAPSULE | Freq: Once | ORAL | Status: AC
  Administered 2020-10-13: 25 mg via ORAL

## 2020-10-13 MED ORDER — LORAZEPAM 2 MG/ML IJ SOLN
INTRAMUSCULAR | Status: AC
  Filled 2020-10-13: qty 1

## 2020-10-13 MED ORDER — LORAZEPAM 2 MG/ML IJ SOLN
1.0000 mg | Freq: Once | INTRAMUSCULAR | Status: AC | PRN
  Administered 2020-10-13: 1 mg via INTRAVENOUS

## 2020-10-13 MED ORDER — HEPARIN SOD (PORK) LOCK FLUSH 100 UNIT/ML IV SOLN
500.0000 [IU] | Freq: Once | INTRAVENOUS | Status: AC | PRN
  Administered 2020-10-13: 500 [IU]
  Filled 2020-10-13: qty 5

## 2020-10-13 MED ORDER — SODIUM CHLORIDE 0.9 % IV SOLN
Freq: Once | INTRAVENOUS | Status: AC
  Filled 2020-10-13: qty 250

## 2020-10-13 MED ORDER — ACETAMINOPHEN 325 MG PO TABS
ORAL_TABLET | ORAL | Status: AC
  Filled 2020-10-13: qty 2

## 2020-10-13 MED ORDER — ACETAMINOPHEN 325 MG PO TABS
650.0000 mg | ORAL_TABLET | Freq: Once | ORAL | Status: AC
  Administered 2020-10-13: 650 mg via ORAL

## 2020-10-13 MED ORDER — SODIUM CHLORIDE 0.9% FLUSH
10.0000 mL | INTRAVENOUS | Status: DC | PRN
Start: 2020-10-13 — End: 2020-10-13
  Administered 2020-10-13: 10 mL
  Filled 2020-10-13: qty 10

## 2020-10-13 NOTE — Patient Instructions (Signed)
[facility] Discharge Instructions for Patients Receiving Chemotherapy  Today you received the following chemotherapy agents: Ogivri   To help prevent nausea and vomiting after your treatment, we encourage you to take your nausea medication as directed.    If you develop nausea and vomiting that is not controlled by your nausea medication, call the clinic.   BELOW ARE SYMPTOMS THAT SHOULD BE REPORTED IMMEDIATELY:  *FEVER GREATER THAN 100.5 F  *CHILLS WITH OR WITHOUT FEVER  NAUSEA AND VOMITING THAT IS NOT CONTROLLED WITH YOUR NAUSEA MEDICATION  *UNUSUAL SHORTNESS OF BREATH  *UNUSUAL BRUISING OR BLEEDING  TENDERNESS IN MOUTH AND THROAT WITH OR WITHOUT PRESENCE OF ULCERS  *URINARY PROBLEMS  *BOWEL PROBLEMS  UNUSUAL RASH Items with * indicate a potential emergency and should be followed up as soon as possible.  Feel free to call the clinic should you have any questions or concerns. The clinic phone number is [phone].  Please show the CHEMO ALERT CARD at check-in to the Emergency Department and triage nurse.   

## 2020-10-13 NOTE — Telephone Encounter (Signed)
Scheduled appts per 2/17 los. Pt to get updated appt calendar at next visit per appt notes.  

## 2020-10-25 DIAGNOSIS — Z6841 Body Mass Index (BMI) 40.0 and over, adult: Secondary | ICD-10-CM | POA: Diagnosis not present

## 2020-10-25 DIAGNOSIS — M1711 Unilateral primary osteoarthritis, right knee: Secondary | ICD-10-CM | POA: Diagnosis not present

## 2020-10-28 ENCOUNTER — Telehealth: Payer: Self-pay | Admitting: Oncology

## 2020-10-28 NOTE — Telephone Encounter (Signed)
R/s appt per 3/3 sch msg - pt is aware 3/10 appt moved to 3/17

## 2020-11-03 ENCOUNTER — Inpatient Hospital Stay: Payer: Medicare Other

## 2020-11-10 ENCOUNTER — Other Ambulatory Visit: Payer: Self-pay

## 2020-11-10 ENCOUNTER — Inpatient Hospital Stay: Payer: Medicare Other

## 2020-11-10 ENCOUNTER — Inpatient Hospital Stay: Payer: Medicare Other | Attending: Oncology

## 2020-11-10 VITALS — BP 157/89 | HR 81 | Temp 99.0°F | Resp 20 | Wt 281.0 lb

## 2020-11-10 DIAGNOSIS — C50812 Malignant neoplasm of overlapping sites of left female breast: Secondary | ICD-10-CM | POA: Diagnosis not present

## 2020-11-10 DIAGNOSIS — Z7901 Long term (current) use of anticoagulants: Secondary | ICD-10-CM | POA: Diagnosis not present

## 2020-11-10 DIAGNOSIS — Z5112 Encounter for antineoplastic immunotherapy: Secondary | ICD-10-CM | POA: Insufficient documentation

## 2020-11-10 DIAGNOSIS — C50911 Malignant neoplasm of unspecified site of right female breast: Secondary | ICD-10-CM | POA: Diagnosis not present

## 2020-11-10 DIAGNOSIS — Z171 Estrogen receptor negative status [ER-]: Secondary | ICD-10-CM

## 2020-11-10 DIAGNOSIS — C50919 Malignant neoplasm of unspecified site of unspecified female breast: Secondary | ICD-10-CM

## 2020-11-10 DIAGNOSIS — C50011 Malignant neoplasm of nipple and areola, right female breast: Secondary | ICD-10-CM

## 2020-11-10 LAB — COMPREHENSIVE METABOLIC PANEL
ALT: 13 U/L (ref 0–44)
AST: 10 U/L — ABNORMAL LOW (ref 15–41)
Albumin: 3.8 g/dL (ref 3.5–5.0)
Alkaline Phosphatase: 69 U/L (ref 38–126)
Anion gap: 8 (ref 5–15)
BUN: 21 mg/dL (ref 8–23)
CO2: 26 mmol/L (ref 22–32)
Calcium: 9.4 mg/dL (ref 8.9–10.3)
Chloride: 105 mmol/L (ref 98–111)
Creatinine, Ser: 0.86 mg/dL (ref 0.44–1.00)
GFR, Estimated: >60 mL/min (ref >60)
Glucose, Bld: 104 mg/dL — ABNORMAL HIGH (ref 70–99)
Potassium: 4.5 mmol/L (ref 3.5–5.1)
Sodium: 139 mmol/L (ref 135–145)
Total Bilirubin: 0.7 mg/dL (ref 0.3–1.2)
Total Protein: 8 g/dL (ref 6.5–8.1)

## 2020-11-10 LAB — CBC WITH DIFFERENTIAL/PLATELET
Abs Immature Granulocytes: 0.05 10*3/uL (ref 0.00–0.07)
Basophils Absolute: 0.1 10*3/uL (ref 0.0–0.1)
Basophils Relative: 0 %
Eosinophils Absolute: 0.1 10*3/uL (ref 0.0–0.5)
Eosinophils Relative: 1 %
HCT: 35.4 % — ABNORMAL LOW (ref 36.0–46.0)
Hemoglobin: 11.9 g/dL — ABNORMAL LOW (ref 12.0–15.0)
Immature Granulocytes: 0 %
Lymphocytes Relative: 13 %
Lymphs Abs: 1.6 10*3/uL (ref 0.7–4.0)
MCH: 28.3 pg (ref 26.0–34.0)
MCHC: 33.6 g/dL (ref 30.0–36.0)
MCV: 84.3 fL (ref 80.0–100.0)
Monocytes Absolute: 0.7 10*3/uL (ref 0.1–1.0)
Monocytes Relative: 5 %
Neutro Abs: 10.1 10*3/uL — ABNORMAL HIGH (ref 1.7–7.7)
Neutrophils Relative %: 81 %
Platelets: 264 10*3/uL (ref 150–400)
RBC: 4.2 MIL/uL (ref 3.87–5.11)
RDW: 15.7 % — ABNORMAL HIGH (ref 11.5–15.5)
WBC: 12.5 10*3/uL — ABNORMAL HIGH (ref 4.0–10.5)
nRBC: 0 % (ref 0.0–0.2)

## 2020-11-10 LAB — PROTIME-INR
INR: 2.1 — ABNORMAL HIGH (ref 0.8–1.2)
Prothrombin Time: 22.7 seconds — ABNORMAL HIGH (ref 11.4–15.2)

## 2020-11-10 MED ORDER — ACETAMINOPHEN 325 MG PO TABS
ORAL_TABLET | ORAL | Status: AC
  Filled 2020-11-10: qty 2

## 2020-11-10 MED ORDER — ACETAMINOPHEN 325 MG PO TABS
650.0000 mg | ORAL_TABLET | Freq: Once | ORAL | Status: AC
  Administered 2020-11-10: 650 mg via ORAL

## 2020-11-10 MED ORDER — SODIUM CHLORIDE 0.9% FLUSH
10.0000 mL | INTRAVENOUS | Status: DC | PRN
  Administered 2020-11-10: 10 mL
  Filled 2020-11-10: qty 10

## 2020-11-10 MED ORDER — TRASTUZUMAB-DKST CHEMO 150 MG IV SOLR
750.0000 mg | Freq: Once | INTRAVENOUS | Status: AC
  Administered 2020-11-10: 750 mg via INTRAVENOUS
  Filled 2020-11-10: qty 35.72

## 2020-11-10 MED ORDER — LORAZEPAM 2 MG/ML IJ SOLN
INTRAMUSCULAR | Status: AC
  Filled 2020-11-10: qty 1

## 2020-11-10 MED ORDER — LORAZEPAM 2 MG/ML IJ SOLN
1.0000 mg | Freq: Once | INTRAMUSCULAR | Status: AC | PRN
  Administered 2020-11-10: 1 mg via INTRAVENOUS

## 2020-11-10 MED ORDER — DIPHENHYDRAMINE HCL 25 MG PO CAPS
ORAL_CAPSULE | ORAL | Status: AC
  Filled 2020-11-10: qty 1

## 2020-11-10 MED ORDER — DIPHENHYDRAMINE HCL 25 MG PO CAPS
25.0000 mg | ORAL_CAPSULE | Freq: Once | ORAL | Status: AC
  Administered 2020-11-10: 25 mg via ORAL

## 2020-11-10 MED ORDER — SODIUM CHLORIDE 0.9 % IV SOLN
Freq: Once | INTRAVENOUS | Status: AC
  Filled 2020-11-10: qty 250

## 2020-11-10 MED ORDER — HEPARIN SOD (PORK) LOCK FLUSH 100 UNIT/ML IV SOLN
500.0000 [IU] | Freq: Once | INTRAVENOUS | Status: AC | PRN
  Administered 2020-11-10: 500 [IU]
  Filled 2020-11-10: qty 5

## 2020-11-10 NOTE — Patient Instructions (Signed)
[facility] Discharge Instructions for Patients Receiving Chemotherapy  Today you received the following chemotherapy agents: [id number].  To help prevent nausea and vomiting after your treatment, we encourage you to take your nausea medication as directed.   If you develop nausea and vomiting that is not controlled by your nausea medication, call the clinic.   BELOW ARE SYMPTOMS THAT SHOULD BE REPORTED IMMEDIATELY:  *FEVER GREATER THAN 100.5 F  *CHILLS WITH OR WITHOUT FEVER  NAUSEA AND VOMITING THAT IS NOT CONTROLLED WITH YOUR NAUSEA MEDICATION  *UNUSUAL SHORTNESS OF BREATH  *UNUSUAL BRUISING OR BLEEDING  TENDERNESS IN MOUTH AND THROAT WITH OR WITHOUT PRESENCE OF ULCERS  *URINARY PROBLEMS  *BOWEL PROBLEMS  UNUSUAL RASH Items with * indicate a potential emergency and should be followed up as soon as possible.  Feel free to call the clinic should you have any questions or concerns. The clinic phone number is [phone].  Please show the [facility] at check-in to the Emergency Department and triage nurse.

## 2020-11-13 ENCOUNTER — Other Ambulatory Visit: Payer: Self-pay | Admitting: Oncology

## 2020-11-17 ENCOUNTER — Ambulatory Visit (HOSPITAL_COMMUNITY)
Admission: RE | Admit: 2020-11-17 | Discharge: 2020-11-17 | Disposition: A | Discharge status: Home / Self Care | Payer: Medicare Other | Source: Ambulatory Visit | Attending: Cardiology | Admitting: Cardiology

## 2020-11-17 ENCOUNTER — Other Ambulatory Visit: Payer: Self-pay

## 2020-11-17 ENCOUNTER — Encounter (HOSPITAL_COMMUNITY): Payer: Self-pay | Admitting: Cardiology

## 2020-11-17 VITALS — BP 118/70 | HR 70 | Wt 281.8 lb

## 2020-11-17 DIAGNOSIS — Z853 Personal history of malignant neoplasm of breast: Secondary | ICD-10-CM | POA: Insufficient documentation

## 2020-11-17 DIAGNOSIS — Z79899 Other long term (current) drug therapy: Secondary | ICD-10-CM | POA: Insufficient documentation

## 2020-11-17 DIAGNOSIS — Z9221 Personal history of antineoplastic chemotherapy: Secondary | ICD-10-CM | POA: Diagnosis not present

## 2020-11-17 DIAGNOSIS — Z923 Personal history of irradiation: Secondary | ICD-10-CM | POA: Insufficient documentation

## 2020-11-17 DIAGNOSIS — Z7901 Long term (current) use of anticoagulants: Secondary | ICD-10-CM | POA: Diagnosis not present

## 2020-11-17 DIAGNOSIS — C50012 Malignant neoplasm of nipple and areola, left female breast: Secondary | ICD-10-CM

## 2020-11-17 DIAGNOSIS — C50011 Malignant neoplasm of nipple and areola, right female breast: Secondary | ICD-10-CM | POA: Diagnosis not present

## 2020-11-17 DIAGNOSIS — I5032 Chronic diastolic (congestive) heart failure: Secondary | ICD-10-CM | POA: Insufficient documentation

## 2020-11-17 DIAGNOSIS — I871 Compression of vein: Secondary | ICD-10-CM | POA: Diagnosis not present

## 2020-11-17 DIAGNOSIS — Z9013 Acquired absence of bilateral breasts and nipples: Secondary | ICD-10-CM | POA: Insufficient documentation

## 2020-11-17 DIAGNOSIS — Z8249 Family history of ischemic heart disease and other diseases of the circulatory system: Secondary | ICD-10-CM | POA: Diagnosis not present

## 2020-11-17 DIAGNOSIS — Z7952 Long term (current) use of systemic steroids: Secondary | ICD-10-CM | POA: Insufficient documentation

## 2020-11-17 DIAGNOSIS — I110 Hypertensive heart disease with heart failure: Secondary | ICD-10-CM | POA: Insufficient documentation

## 2020-11-17 DIAGNOSIS — Z171 Estrogen receptor negative status [ER-]: Secondary | ICD-10-CM

## 2020-11-17 HISTORY — DX: Heart failure, unspecified: I50.9

## 2020-11-17 LAB — LIPID PANEL
Cholesterol: 230 mg/dL — ABNORMAL HIGH (ref 0–200)
HDL: 89 mg/dL (ref >40)
LDL Cholesterol: 127 mg/dL — ABNORMAL HIGH (ref 0–99)
Total CHOL/HDL Ratio: 2.6 RATIO
Triglycerides: 72 mg/dL (ref <150)
VLDL: 14 mg/dL (ref 0–40)

## 2020-11-17 LAB — MAGNESIUM: Magnesium: 2 mg/dL (ref 1.7–2.4)

## 2020-11-17 NOTE — Patient Instructions (Signed)
Labs done today, we will call you for abnormal results  Please call our office in August to schedule your follow up appointment  If you have any questions or concerns before your next appointment please send [place] a message through [facility] or call our office at [phone].    TO LEAVE A MESSAGE FOR THE NURSE SELECT OPTION 2, PLEASE LEAVE A MESSAGE INCLUDING: . YOUR NAME . DATE OF BIRTH . CALL BACK NUMBER . REASON FOR CALL**this is important as we prioritize the call backs  [facility] AS LONG AS YOU CALL BEFORE 4:00 PM  At the [facility], you and your health needs are our priority. As part of our continuing mission to provide you with exceptional heart care, we have created designated Provider Care Teams. These Care Teams include your primary Cardiologist (physician) and Advanced Practice Providers (APPs- Physician Assistants and Nurse Practitioners) who all work together to provide you with the care you need, when you need it.   You may see any of the following providers on your designated Care Team at your next follow up: Marland Kitchen Dr Glori Bickers . Dr Loralie Champagne . Dr Vickki Muff . Darrick Grinder, NP . Lyda Jester, [place] . Audry Riles, PharmD   Please be sure to bring in all your medications bottles to every appointment.

## 2020-11-17 NOTE — Progress Notes (Signed)
Advanced Heart Failure Clinic Note   Patient ID: [name], female   DOB: 10-Aug-1951, 70 y.o.   MRN: [id number] Oncologist: Dr Jana Hakim  HPI: [name] is a 70 y.o. with a history of metastatic HER-2 positive breast carcinoma originally diagnosed September 2004. Started in R breast. Underwent neo-adjuvant chemo. During this time developed L breast CA. Underwent bilateral mastectomy. Lymph nodes +. 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   She received a total of 6 cycles of Taxotere/Carbo/Herceptin, completed in April 2005, after which she began single agent Herceptin, given every 4 weeks now. Plan to continue on Herceptin indefinitely.   Coronary CTA in 8/20 showed no significant CAD, calcium score 0.   She returns for cardio-oncology followup.  She has been stable symptomatically.  Weight down 7 lbs.  Using Lasix about once a week or less.  Dyspnea only with heavy exertion such as weed-eating or push-mowing. No chest pain.  No orthopnea/PND.    ECG (personally reviewed): NSR, LVH  Labs 11/15: K 4, creatinine 1.0   Labs 1/16: K 4.1, creatinine 1.0 Labs 8/16: K 4.3, creatinine 0.9 Labs 11/16: K 3.9, creatinine 0.9 Labs 5/17: K 3.9, creatinine 0.68 Labs 5/18: K 4, creatinine 0.65 Labs 1/19: K 4.1, creatinine 1.23 Labs 7/19: K 3.9, creatinine 1.11 Labs 1/20: K 3.8, creatinine 0.83 Labs 7/20: K 4.1, creatinine 1.18, LDL 112 Labs 3/21: K 4.2, creatinine 0.86 Labs 4/21: LDL 115, HDL 105 Labs 3/22: K 4.5, creatinine 0.86  04/23/12 ECHO EF 60-65% Lateral s' 8.9 cm/s  07/30/12 ECHO EF 60-65% Lateral s' 8.3 cm/s  09/11/12 ECHO EF 60-65% Lateral s' 10.3 cm/s  12/22/12 ECHO 55-60% Lateral S' 9.8 RV mildly dilated  07/01/13 ECHO 55-60%, lateral s' 9.79, mild RV dilation, grade II DD 3/15 ECHO 55%, mild MR, lateral s' 9.6, GLS -19.2% 03/02/14 ECHO EF 55-60% Lateral S' 9.4 GLS - 21.9  11/15 ECHO EF 60-65%, lateral S' 6.8, GLS -17.2%, mild RV  dilation with normal systolic function, mild MR.  2/16 ECHO EF 60-65%, lateral S' 14.4, GLS -29.7%, normal diastolic function, mild RV dilation with normal RV systolic function.  8/16 ECHO EF 60-65%, mild LVH, normal RV size and systolic function, lateral s' 13.2, GLS -17%.  11/16 ECHO EF 60-65% Lateral S' 10.2, GLS -20.2% 5/17 ECHO EF 60-65%, mild LVH, lateral s' 12.2, GLS -20.8%, grade II diastolic dysfunction, normal RV. 9/17 ECHO EF 98-92%, normal diastolic function, GLS -11.9% 12/17 ECHO EF 55-60%, moderate diastolic dysfunction, GLS -20.1%, normal RV size and systolic function 4/17 ECHO EF 55-60%, GLS -40.8%, normal diastolic function, normal RV size and systolic function.  1/19 ECHO EF 55-60%, GLS -14.4%, normal diastolic function, RV normal size and systolic function.  7/19 ECHO EF 60-65%, GLS -22.1%.  1/20 ECHO EF 81-85%, normal diastolic function, normal RV, strain not done.  6/20 ECHO EF 63-14%, normal diastolic function, normal RV, GLS -24.4% 4/21 ECHO EF 97-02%, normal diastolic function, normal RV, strain tracking was not accurate.  1/22 ECHO EF 60-65%, normal RV, moderate LAE, IVC normal.    ROS: All systems negative except as listed in HPI, PMH and Problem List.  Past Medical History:  Diagnosis Date  . Breast cancer ([place])    mets to liver and lung  . Breast cancer metastasized to multiple sites ([place]) 02/26/2013  . CHF (congestive heart failure) ([place])   . History of chemotherapy Feb. 2006   taxotere/herceptin/carboplatin  . Hypertension   . Neuropathy   .  Radiation 07/31/2006   left upper chest  . Radiation 06/17/2006-06/27/2006   6480 cGy bilat. chest wall  . SVC syndrome   . Thrombosis     Current Outpatient Medications  Medication Sig Dispense Refill  . acetaminophen (TYLENOL) 500 MG tablet Take 1,000 mg by mouth every 6 (six) hours as needed for mild pain or fever.     Marland Kitchen albuterol (PROVENTIL HFA;VENTOLIN HFA) 108 (90 BASE) MCG/ACT inhaler Inhale 2 puffs into the  lungs every 6 (six) hours as needed for wheezing. 1 Inhaler 5  . ALPRAZolam (XANAX) 1 MG tablet Take 1 tablet (1 mg total) by mouth 3 (three) times daily as needed. for anxiety 60 tablet 1  . amLODipine (NORVASC) 10 MG tablet Take 1 tablet (10 mg total) by mouth every morning. 30 tablet 4  . baclofen (LIORESAL) 10 MG tablet TAKE 1 TABLET BY MOUTH THREE TIMES DAILY AS NEEDED FOR MUSCLE SPASMS 270 tablet 0  . carvedilol (COREG) 12.5 MG tablet TAKE 1 TABLET(12.5 MG) BY MOUTH TWICE DAILY 60 tablet 6  . diclofenac sodium (VOLTAREN) 1 % GEL Apply 2 g topically daily as needed (for pain). Apply to knees and shoulders 100 g 6  . furosemide (LASIX) 40 MG tablet TAKE 1 TABLET(40 MG) BY MOUTH DAILY 30 tablet 3  . gabapentin (NEURONTIN) 300 MG capsule TAKE 2 CAPSULES(600 MG) BY MOUTH THREE TIMES DAILY 540 capsule 0  . losartan (COZAAR) 100 MG tablet Take 100 mg by mouth every morning.    Marland Kitchen oxyCODONE-acetaminophen (PERCOCET) 10-325 MG tablet Take 1 tablet by mouth 4 (four) times daily as needed.    . potassium chloride (K-DUR,KLOR-CON) 20 MEQ tablet Take 1 tablet (20 mEq total) by mouth daily. 30 tablet 3  . predniSONE (DELTASONE) 5 MG tablet     . spironolactone (ALDACTONE) 25 MG tablet TAKE 1 TABLET(25 MG) BY MOUTH DAILY 30 tablet 11  . temazepam (RESTORIL) 30 MG capsule TAKE ONE CAPSULE BY MOUTH EVERY NIGHT AT BEDTIME AS NEEDED FOR SLEEP 30 capsule 2  . warfarin (COUMADIN) 5 MG tablet TAKE 1 TABLET BY MOUTH ON ODD NUMBERED DAYS PLUS 1 AND 1/2 TABLETS ON EVEN NUMBERED DAYS 90 tablet 4   No current facility-administered medications for this encounter.   Facility-Administered Medications Ordered in Other Encounters  Medication Dose Route Frequency Provider Last Rate Last Admin  . sodium chloride flush (NS) 0.9 % injection 10 mL  10 mL Intravenous PRN Magrinat, Virgie Dad, MD   10 mL at 12/15/15 1200  . sodium chloride flush (NS) 0.9 % injection 10 mL  10 mL Intracatheter PRN Magrinat, Virgie Dad, MD   10 mL at  08/14/18 1116  . sodium chloride flush (NS) 0.9 % injection 10 mL  10 mL Intracatheter PRN Magrinat, Virgie Dad, MD        Vitals:   11/17/20 1052  BP: 118/70  Pulse: 70  SpO2: 96%  Weight: 127.8 kg (281 lb 12.8 oz)    PHYSICAL EXAM: General: NAD Neck: No JVD, no thyromegaly or thyroid nodule.  Lungs: Clear to auscultation bilaterally with normal respiratory effort. CV: Nondisplaced PMI.  Heart regular S1/S2, no S3/S4, no murmur.  Trace ankle edema.  No carotid bruit.  Normal pedal pulses.  Abdomen: Soft, nontender, no hepatosplenomegaly, no distention.  Skin: Intact without lesions or rashes.  Neurologic: Alert and oriented x 3.  Psych: Normal affect. Extremities: No clubbing or cyanosis.  HEENT: Normal.   ASSESSMENT & PLAN: 1) L Breast Cancer s/p bilateral mastectomies:  Symptomatically stable. Echo from 1/22 reviewed today, EF normal. She will be continuing Herceptin indefinitely.   - Repeat echo in 6 months.   2) Suspected OSA: She has not wanted to do a sleep study.    3) HTN: BP controlled on current regimen.     4) Chronic diastolic CHF: Looks euvolemic on exam, NYHA class II symptoms.    - Can continue to take Lasix prn.  5) SVC syndrome: Continue warfarin. 6) Coronary disease risk: She has a strong FH of coronary disease, as well as HTN.  Coronary CTA in 8/20 showed no significant coronary disease and calcium score 0.  - No ASA given warfarin use.  - Check lipids again today.   Followup 6 months with echo.   Loralie Champagne 11/17/2020

## 2020-11-21 ENCOUNTER — Other Ambulatory Visit: Payer: Self-pay | Admitting: Oncology

## 2020-11-21 ENCOUNTER — Other Ambulatory Visit (HOSPITAL_COMMUNITY): Payer: Self-pay | Admitting: Cardiology

## 2020-11-24 ENCOUNTER — Inpatient Hospital Stay: Payer: Medicare Other

## 2020-11-24 ENCOUNTER — Ambulatory Visit: Payer: Medicare Other

## 2020-11-24 ENCOUNTER — Other Ambulatory Visit: Payer: Medicare Other

## 2020-11-25 ENCOUNTER — Other Ambulatory Visit: Payer: Self-pay | Admitting: Oncology

## 2020-11-28 ENCOUNTER — Other Ambulatory Visit: Payer: Self-pay

## 2020-11-28 ENCOUNTER — Telehealth: Payer: Self-pay | Admitting: Oncology

## 2020-11-28 NOTE — Telephone Encounter (Signed)
Scheduled appts per 3/31 sch msg. Pt aware.  

## 2020-12-01 ENCOUNTER — Inpatient Hospital Stay: Payer: Medicare Other

## 2020-12-08 ENCOUNTER — Other Ambulatory Visit: Payer: Self-pay

## 2020-12-08 ENCOUNTER — Inpatient Hospital Stay: Payer: Medicare Other

## 2020-12-08 ENCOUNTER — Inpatient Hospital Stay: Payer: Medicare Other | Attending: Oncology

## 2020-12-08 VITALS — BP 122/72 | HR 70 | Temp 98.2°F | Resp 18

## 2020-12-08 DIAGNOSIS — I871 Compression of vein: Secondary | ICD-10-CM | POA: Insufficient documentation

## 2020-12-08 DIAGNOSIS — C50011 Malignant neoplasm of nipple and areola, right female breast: Secondary | ICD-10-CM

## 2020-12-08 DIAGNOSIS — Z5112 Encounter for antineoplastic immunotherapy: Secondary | ICD-10-CM | POA: Insufficient documentation

## 2020-12-08 DIAGNOSIS — C50919 Malignant neoplasm of unspecified site of unspecified female breast: Secondary | ICD-10-CM

## 2020-12-08 DIAGNOSIS — C50812 Malignant neoplasm of overlapping sites of left female breast: Secondary | ICD-10-CM | POA: Insufficient documentation

## 2020-12-08 DIAGNOSIS — Z171 Estrogen receptor negative status [ER-]: Secondary | ICD-10-CM

## 2020-12-08 DIAGNOSIS — C50911 Malignant neoplasm of unspecified site of right female breast: Secondary | ICD-10-CM | POA: Insufficient documentation

## 2020-12-08 DIAGNOSIS — Z95828 Presence of other vascular implants and grafts: Secondary | ICD-10-CM

## 2020-12-08 DIAGNOSIS — C50012 Malignant neoplasm of nipple and areola, left female breast: Secondary | ICD-10-CM

## 2020-12-08 LAB — CBC WITH DIFFERENTIAL/PLATELET
Abs Immature Granulocytes: 0.02 10*3/uL (ref 0.00–0.07)
Basophils Absolute: 0 10*3/uL (ref 0.0–0.1)
Basophils Relative: 0 %
Eosinophils Absolute: 0 10*3/uL (ref 0.0–0.5)
Eosinophils Relative: 0 %
HCT: 32.3 % — ABNORMAL LOW (ref 36.0–46.0)
Hemoglobin: 11.1 g/dL — ABNORMAL LOW (ref 12.0–15.0)
Immature Granulocytes: 0 %
Lymphocytes Relative: 15 %
Lymphs Abs: 1.4 10*3/uL (ref 0.7–4.0)
MCH: 28.8 pg (ref 26.0–34.0)
MCHC: 34.4 g/dL (ref 30.0–36.0)
MCV: 83.9 fL (ref 80.0–100.0)
Monocytes Absolute: 0.5 10*3/uL (ref 0.1–1.0)
Monocytes Relative: 5 %
Neutro Abs: 7.6 10*3/uL (ref 1.7–7.7)
Neutrophils Relative %: 80 %
Platelets: 249 10*3/uL (ref 150–400)
RBC: 3.85 MIL/uL — ABNORMAL LOW (ref 3.87–5.11)
RDW: 15.9 % — ABNORMAL HIGH (ref 11.5–15.5)
WBC: 9.6 10*3/uL (ref 4.0–10.5)
nRBC: 0 % (ref 0.0–0.2)

## 2020-12-08 LAB — COMPREHENSIVE METABOLIC PANEL
ALT: 20 U/L (ref 0–44)
AST: 17 U/L (ref 15–41)
Albumin: 3.6 g/dL (ref 3.5–5.0)
Alkaline Phosphatase: 61 U/L (ref 38–126)
Anion gap: 11 (ref 5–15)
BUN: 18 mg/dL (ref 8–23)
CO2: 27 mmol/L (ref 22–32)
Calcium: 9 mg/dL (ref 8.9–10.3)
Chloride: 104 mmol/L (ref 98–111)
Creatinine, Ser: 0.85 mg/dL (ref 0.44–1.00)
GFR, Estimated: >60 mL/min (ref >60)
Glucose, Bld: 103 mg/dL — ABNORMAL HIGH (ref 70–99)
Potassium: 4 mmol/L (ref 3.5–5.1)
Sodium: 142 mmol/L (ref 135–145)
Total Bilirubin: 0.6 mg/dL (ref 0.3–1.2)
Total Protein: 7.2 g/dL (ref 6.5–8.1)

## 2020-12-08 LAB — PROTIME-INR
INR: 3.4 — ABNORMAL HIGH (ref 0.8–1.2)
Prothrombin Time: 34.2 seconds — ABNORMAL HIGH (ref 11.4–15.2)

## 2020-12-08 MED ORDER — SODIUM CHLORIDE 0.9 % IV SOLN
Freq: Once | INTRAVENOUS | Status: AC
  Filled 2020-12-08: qty 250

## 2020-12-08 MED ORDER — DIPHENHYDRAMINE HCL 25 MG PO CAPS
25.0000 mg | ORAL_CAPSULE | Freq: Once | ORAL | Status: AC
  Administered 2020-12-08: 25 mg via ORAL

## 2020-12-08 MED ORDER — SODIUM CHLORIDE 0.9% FLUSH
10.0000 mL | INTRAVENOUS | Status: DC | PRN
  Administered 2020-12-08: 10 mL
  Filled 2020-12-08: qty 10

## 2020-12-08 MED ORDER — HEPARIN SOD (PORK) LOCK FLUSH 100 UNIT/ML IV SOLN
500.0000 [IU] | Freq: Once | INTRAVENOUS | Status: AC | PRN
  Administered 2020-12-08: 500 [IU] via INTRAVENOUS
  Filled 2020-12-08: qty 5

## 2020-12-08 MED ORDER — DIPHENHYDRAMINE HCL 25 MG PO CAPS
ORAL_CAPSULE | ORAL | Status: AC
  Filled 2020-12-08: qty 2

## 2020-12-08 MED ORDER — ACETAMINOPHEN 325 MG PO TABS
650.0000 mg | ORAL_TABLET | Freq: Once | ORAL | Status: AC
  Administered 2020-12-08: 650 mg via ORAL

## 2020-12-08 MED ORDER — LORAZEPAM 2 MG/ML IJ SOLN
INTRAMUSCULAR | Status: AC
  Filled 2020-12-08: qty 1

## 2020-12-08 MED ORDER — ACETAMINOPHEN 325 MG PO TABS
ORAL_TABLET | ORAL | Status: AC
  Filled 2020-12-08: qty 2

## 2020-12-08 MED ORDER — TRASTUZUMAB-DKST CHEMO 150 MG IV SOLR
750.0000 mg | Freq: Once | INTRAVENOUS | Status: AC
  Administered 2020-12-08: 750 mg via INTRAVENOUS
  Filled 2020-12-08: qty 35.72

## 2020-12-08 MED ORDER — LORAZEPAM 2 MG/ML IJ SOLN
1.0000 mg | Freq: Once | INTRAMUSCULAR | Status: AC | PRN
  Administered 2020-12-08: 1 mg via INTRAVENOUS

## 2020-12-08 NOTE — Patient Instructions (Signed)
[facility] Discharge Instructions for Patients Receiving Chemotherapy  Today you received the following chemotherapy agents: Ogivri   To help prevent nausea and vomiting after your treatment, we encourage you to take your nausea medication as directed.    If you develop nausea and vomiting that is not controlled by your nausea medication, call the clinic.   BELOW ARE SYMPTOMS THAT SHOULD BE REPORTED IMMEDIATELY:  *FEVER GREATER THAN 100.5 F  *CHILLS WITH OR WITHOUT FEVER  NAUSEA AND VOMITING THAT IS NOT CONTROLLED WITH YOUR NAUSEA MEDICATION  *UNUSUAL SHORTNESS OF BREATH  *UNUSUAL BRUISING OR BLEEDING  TENDERNESS IN MOUTH AND THROAT WITH OR WITHOUT PRESENCE OF ULCERS  *URINARY PROBLEMS  *BOWEL PROBLEMS  UNUSUAL RASH Items with * indicate a potential emergency and should be followed up as soon as possible.  Feel free to call the clinic should you have any questions or concerns. The clinic phone number is [phone].  Please show the CHEMO ALERT CARD at check-in to the Emergency Department and triage nurse.   

## 2020-12-15 ENCOUNTER — Ambulatory Visit: Payer: Medicare Other

## 2020-12-15 ENCOUNTER — Other Ambulatory Visit: Payer: Medicare Other

## 2020-12-22 ENCOUNTER — Ambulatory Visit: Payer: Medicare Other

## 2020-12-22 ENCOUNTER — Other Ambulatory Visit: Payer: Medicare Other

## 2020-12-27 DIAGNOSIS — I10 Essential (primary) hypertension: Secondary | ICD-10-CM | POA: Diagnosis not present

## 2020-12-27 DIAGNOSIS — J302 Other seasonal allergic rhinitis: Secondary | ICD-10-CM | POA: Diagnosis not present

## 2020-12-27 DIAGNOSIS — Z1339 Encounter for screening examination for other mental health and behavioral disorders: Secondary | ICD-10-CM | POA: Diagnosis not present

## 2020-12-27 DIAGNOSIS — M542 Cervicalgia: Secondary | ICD-10-CM | POA: Diagnosis not present

## 2020-12-27 DIAGNOSIS — Z0000 Encounter for general adult medical examination without abnormal findings: Secondary | ICD-10-CM | POA: Diagnosis not present

## 2020-12-27 DIAGNOSIS — Z1331 Encounter for screening for depression: Secondary | ICD-10-CM | POA: Diagnosis not present

## 2020-12-27 DIAGNOSIS — F419 Anxiety disorder, unspecified: Secondary | ICD-10-CM | POA: Diagnosis not present

## 2020-12-27 DIAGNOSIS — G622 Polyneuropathy due to other toxic agents: Secondary | ICD-10-CM | POA: Diagnosis not present

## 2020-12-27 DIAGNOSIS — G4700 Insomnia, unspecified: Secondary | ICD-10-CM | POA: Diagnosis not present

## 2020-12-27 DIAGNOSIS — M170 Bilateral primary osteoarthritis of knee: Secondary | ICD-10-CM | POA: Diagnosis not present

## 2020-12-29 ENCOUNTER — Ambulatory Visit: Payer: Medicare Other

## 2020-12-29 ENCOUNTER — Other Ambulatory Visit: Payer: Medicare Other

## 2021-01-02 ENCOUNTER — Other Ambulatory Visit: Payer: Self-pay | Admitting: *Deleted

## 2021-01-02 ENCOUNTER — Other Ambulatory Visit: Payer: Self-pay | Admitting: Oncology

## 2021-01-02 MED ORDER — ALPRAZOLAM 1 MG PO TABS: 1.0000 mg | ORAL_TABLET | Freq: Three times a day (TID) | ORAL | 0 refills | Status: DC | PRN

## 2021-01-05 ENCOUNTER — Ambulatory Visit: Payer: Medicare Other

## 2021-01-05 ENCOUNTER — Inpatient Hospital Stay: Payer: Medicare Other | Attending: Oncology

## 2021-01-05 ENCOUNTER — Inpatient Hospital Stay: Payer: Medicare Other

## 2021-01-05 ENCOUNTER — Other Ambulatory Visit: Payer: Self-pay

## 2021-01-05 ENCOUNTER — Other Ambulatory Visit: Payer: Medicare Other

## 2021-01-05 ENCOUNTER — Ambulatory Visit: Payer: Medicare Other | Admitting: Oncology

## 2021-01-05 VITALS — BP 160/78 | HR 70 | Temp 97.9°F | Resp 18 | Wt 281.0 lb

## 2021-01-05 DIAGNOSIS — D689 Coagulation defect, unspecified: Secondary | ICD-10-CM | POA: Diagnosis not present

## 2021-01-05 DIAGNOSIS — C50011 Malignant neoplasm of nipple and areola, right female breast: Secondary | ICD-10-CM

## 2021-01-05 DIAGNOSIS — C50812 Malignant neoplasm of overlapping sites of left female breast: Secondary | ICD-10-CM | POA: Insufficient documentation

## 2021-01-05 DIAGNOSIS — C50012 Malignant neoplasm of nipple and areola, left female breast: Secondary | ICD-10-CM

## 2021-01-05 DIAGNOSIS — Z171 Estrogen receptor negative status [ER-]: Secondary | ICD-10-CM

## 2021-01-05 DIAGNOSIS — C50919 Malignant neoplasm of unspecified site of unspecified female breast: Secondary | ICD-10-CM

## 2021-01-05 DIAGNOSIS — C50911 Malignant neoplasm of unspecified site of right female breast: Secondary | ICD-10-CM | POA: Diagnosis not present

## 2021-01-05 DIAGNOSIS — Z5112 Encounter for antineoplastic immunotherapy: Secondary | ICD-10-CM | POA: Diagnosis not present

## 2021-01-05 LAB — COMPREHENSIVE METABOLIC PANEL
ALT: 8 U/L (ref 0–44)
AST: 10 U/L — ABNORMAL LOW (ref 15–41)
Albumin: 3.7 g/dL (ref 3.5–5.0)
Alkaline Phosphatase: 67 U/L (ref 38–126)
Anion gap: 10 (ref 5–15)
BUN: 19 mg/dL (ref 8–23)
CO2: 24 mmol/L (ref 22–32)
Calcium: 9.4 mg/dL (ref 8.9–10.3)
Chloride: 108 mmol/L (ref 98–111)
Creatinine, Ser: 0.89 mg/dL (ref 0.44–1.00)
GFR, Estimated: >60 mL/min (ref >60)
Glucose, Bld: 93 mg/dL (ref 70–99)
Potassium: 3.8 mmol/L (ref 3.5–5.1)
Sodium: 142 mmol/L (ref 135–145)
Total Bilirubin: 0.6 mg/dL (ref 0.3–1.2)
Total Protein: 7.8 g/dL (ref 6.5–8.1)

## 2021-01-05 LAB — CBC WITH DIFFERENTIAL/PLATELET
Abs Immature Granulocytes: 0.03 10*3/uL (ref 0.00–0.07)
Basophils Absolute: 0 10*3/uL (ref 0.0–0.1)
Basophils Relative: 0 %
Eosinophils Absolute: 0 10*3/uL (ref 0.0–0.5)
Eosinophils Relative: 0 %
HCT: 36.7 % (ref 36.0–46.0)
Hemoglobin: 12.5 g/dL (ref 12.0–15.0)
Immature Granulocytes: 0 %
Lymphocytes Relative: 15 %
Lymphs Abs: 1.4 10*3/uL (ref 0.7–4.0)
MCH: 28.6 pg (ref 26.0–34.0)
MCHC: 34.1 g/dL (ref 30.0–36.0)
MCV: 84 fL (ref 80.0–100.0)
Monocytes Absolute: 0.4 10*3/uL (ref 0.1–1.0)
Monocytes Relative: 4 %
Neutro Abs: 7.6 10*3/uL (ref 1.7–7.7)
Neutrophils Relative %: 81 %
Platelets: 296 10*3/uL (ref 150–400)
RBC: 4.37 MIL/uL (ref 3.87–5.11)
RDW: 14.9 % (ref 11.5–15.5)
WBC: 9.4 10*3/uL (ref 4.0–10.5)
nRBC: 0 % (ref 0.0–0.2)

## 2021-01-05 LAB — PROTIME-INR
INR: 2 — ABNORMAL HIGH (ref 0.8–1.2)
Prothrombin Time: 22.5 seconds — ABNORMAL HIGH (ref 11.4–15.2)

## 2021-01-05 MED ORDER — ACETAMINOPHEN 325 MG PO TABS
650.0000 mg | ORAL_TABLET | Freq: Once | ORAL | Status: AC
  Administered 2021-01-05: 650 mg via ORAL

## 2021-01-05 MED ORDER — SODIUM CHLORIDE 0.9 % IV SOLN
Freq: Once | INTRAVENOUS | Status: AC
Start: 2021-01-05 — End: 2021-01-05
  Filled 2021-01-05: qty 250

## 2021-01-05 MED ORDER — DIPHENHYDRAMINE HCL 25 MG PO CAPS
ORAL_CAPSULE | ORAL | Status: AC
  Filled 2021-01-05: qty 1

## 2021-01-05 MED ORDER — TRASTUZUMAB-DKST CHEMO 150 MG IV SOLR
750.0000 mg | Freq: Once | INTRAVENOUS | Status: AC
  Administered 2021-01-05: 750 mg via INTRAVENOUS
  Filled 2021-01-05: qty 35.72

## 2021-01-05 MED ORDER — SODIUM CHLORIDE 0.9% FLUSH
10.0000 mL | INTRAVENOUS | Status: DC | PRN
  Administered 2021-01-05: 10 mL
  Filled 2021-01-05: qty 10

## 2021-01-05 MED ORDER — ACETAMINOPHEN 325 MG PO TABS
ORAL_TABLET | ORAL | Status: AC
  Filled 2021-01-05: qty 2

## 2021-01-05 MED ORDER — DIPHENHYDRAMINE HCL 25 MG PO CAPS
25.0000 mg | ORAL_CAPSULE | Freq: Once | ORAL | Status: AC
  Administered 2021-01-05: 25 mg via ORAL

## 2021-01-05 MED ORDER — HEPARIN SOD (PORK) LOCK FLUSH 100 UNIT/ML IV SOLN
500.0000 [IU] | Freq: Once | INTRAVENOUS | Status: AC | PRN
  Administered 2021-01-05: 500 [IU]
  Filled 2021-01-05: qty 5

## 2021-01-05 MED ORDER — LORAZEPAM 2 MG/ML IJ SOLN: 1.0000 mg | Freq: Once | INTRAMUSCULAR | Status: DC | PRN

## 2021-01-05 NOTE — Progress Notes (Signed)
Port flush appt not needed. Patient advises she would like to be accessed in the infusion room.

## 2021-01-05 NOTE — Patient Instructions (Signed)
[facility] Discharge Instructions for Patients Receiving Chemotherapy  Today you received the following chemotherapy agents [id number]   To help prevent nausea and vomiting after your treatment, we encourage you to take your nausea medication as directed.   If you develop nausea and vomiting that is not controlled by your nausea medication, call the clinic.   BELOW ARE SYMPTOMS THAT SHOULD BE REPORTED IMMEDIATELY:  *FEVER GREATER THAN 100.5 F  *CHILLS WITH OR WITHOUT FEVER  NAUSEA AND VOMITING THAT IS NOT CONTROLLED WITH YOUR NAUSEA MEDICATION  *UNUSUAL SHORTNESS OF BREATH  *UNUSUAL BRUISING OR BLEEDING  TENDERNESS IN MOUTH AND THROAT WITH OR WITHOUT PRESENCE OF ULCERS  *URINARY PROBLEMS  *BOWEL PROBLEMS  UNUSUAL RASH Items with * indicate a potential emergency and should be followed up as soon as possible.  Feel free to call the clinic should you have any questions or concerns. The clinic phone number is [phone].  Please show the CHEMO ALERT CARD at check-in to the Emergency Department and triage nurse.   

## 2021-01-12 ENCOUNTER — Other Ambulatory Visit: Payer: Medicare Other

## 2021-01-12 ENCOUNTER — Ambulatory Visit: Payer: Medicare Other

## 2021-01-12 ENCOUNTER — Ambulatory Visit: Payer: Medicare Other | Admitting: Oncology

## 2021-02-01 NOTE — Progress Notes (Signed)
[place]  [phone]) [phone] Fax:(336) [phone]    ID: [name]   DOB: 02-26-1951  MR#: [id number]  HUT#:[id number]   Patient Care Team: Ernestene Kiel, MD as PCP - General (Internal Medicine) Haze Rushing., MD as Consulting Physician (Pain Medicine) Joya Salm, MD as Referring Physician (Orthopedic Surgery) Jaymes Graff, DO as Consulting Physician (Orthopedic Surgery) Larey Dresser, MD as Consulting Physician (Cardiology) Bensimhon, Shaune Pascal, MD as Consulting Physician (Cardiology) Lorelle Gibbs, MD (Radiology) Tommy Medal, Lavell Islam, MD as Consulting Physician (Infectious Diseases) Gery Pray, MD as Consulting Physician (Radiation Oncology) Magrinat, Virgie Dad, MD as Consulting Physician (Oncology)   CHIEF COMPLAINT: Metastatic HER-2 positive, estrogen receptor negative breast cancer (s/p bilateral mastectomies)  CURRENT TREATMENT: Trastuzumab every 28 days; lifelong warfarin   INTERVAL HISTORY: [name] returns today for follow-up and treatment of her estrogen receptor negative, but HER2 amplified breast cancer.   She continues on trastuzumab, which she receives every 28 days.  She has absolutely no side effects related to this that she is aware of.    Her most recent echocardiogram was completed on 08/31/2020 showing an ejection fraction of 60-65%. She sees Dr. Aundra Dubin who wants her to repeat her echo in 04/2021.  She is on lifelong warfarin. She tolerates this well and without any noticeable side effects.  INR today is pending.  [name] notes that she went to see her PCP for a visit about a couple of things and it turned out to be an annual wellness visit.  Her shoulder was bothering her, but she did end up getting an injection which has helped that pain.  She has had intermittent headaches, and notes that they are likely related to stress.  She undergoes imaging annually and has not undergone MRI of the brain.  She denies any  focal weakness, speech changes, numbness or tingling, or any other concerns today.    REVIEW OF SYSTEMS: Review of Systems  Constitutional:  Negative for appetite change, chills, fatigue, fever and unexpected weight change.  HENT:   Negative for hearing loss, lump/mass and trouble swallowing.   Eyes:  Negative for eye problems and icterus.  Respiratory:  Negative for chest tightness, cough and shortness of breath.   Cardiovascular:  Negative for chest pain, leg swelling and palpitations.  Gastrointestinal:  Negative for abdominal distention, abdominal pain, constipation, diarrhea, nausea and vomiting.  Endocrine: Negative for hot flashes.  Genitourinary:  Negative for difficulty urinating.   Musculoskeletal:  Negative for arthralgias.  Skin:  Negative for itching and rash.  Neurological:  Negative for dizziness, extremity weakness, headaches and numbness.  Hematological:  Negative for adenopathy. Does not bruise/bleed easily.  Psychiatric/Behavioral:  Negative for depression. The patient is not nervous/anxious.       COVID 19 VACCINATION STATUS: fully vaccinated (Moderna); infection 04/08/2020; booster late September 2021   BREAST CANCER HISTORY:   From the earlier summary:  [name] is 70 years old [name] female.  This [name] has been in good health all of her life.  She noted a swelling and discomfort in her right breast in June 2004.  She was seen in the Emergency Room in [place] and was treated for mastitis.  She was treated for a number of months with mastitis and the swelling did not get better. She was given hydrocodone and Cipro.  Finally, the swelling did get better and ultimately the nipple became retracted and she noticed some dimpling in her skin. She had a mammogram in  July of 2004 in [place] with subsequent mammogram on May 27, 2003, by Dr. Isaiah Blakes.  Mammogram done on September 30 showed marked increased density in the left breast.  Biopsy was performed the  same day.  It was noted at the 12 o'clock position, deep in the breast was a focal hypoechoic mass, at least 3.5 cm in diameter.  Biopsy did in fact show invasive in situ mammary carcinoma. This was felt to be both at least intermediate, high grade.  No definite lymphovascular invasion was identified. ER and PR negative, Her2 testing positive. [name] continues to have pain in her breast.  She continues to take hydrocodone a number of times a day.  She has been seen by Dr. Rosana Hoes, who felt that neoadjuvant chemotherapy would be required.    Initial staging studies showed evidence of liver and lung mets.   Patient also has evidence of bone lesions. Patient started neoadjuvant chemotherapy, Taxotere/Carbo/Herceptin in October 2004.   Patient had a CT scan in December 2004 which demonstrated extensive clot in the SVC innominate vein, bilateral jugular vein and  She was started on anticoagulation therapy. She received a total of 6 cycles of Taxotere/Carbo/Herceptin, completed in April 2005."  Patient has been on  trastuzumab continued indefinitely; has also received lapatinib and capecitabine for variable intervals in 2007-2008. Most recent echo 12/01/2013 showed an ejection fraction of 55%. She is status post bilateral mastectomies with bilateral axillary lymph node dissection 12/07/2004, showing (a) on the right, a mypT1c ypN1 invasive ductal carcinoma, grade 3, estrogen and progesterone receptor negative, HER-2 positive, with an MIB-1 of 31% (b) on the left, ypT2 ypN1 invasive ductal carcinoma, grade 2, estrogen and progesterone receptor negative, HER-2 positive, with an MIB-1 of 35%. She is Status post radiation June through July of 2006, to the right chest wall, left chest wall, bilateral supraclavicular fossae, and bilateral axillary boosts; with additional radiation to the right and left chest walls and the central chest wall completed November of 2007. She is status post ixempra x9 completed August of 2009. She  has history of superior vena caval syndrome, on life long anticoagulation. She has History of chemotherapy-induced neuropathy. Patient has chronic pain, with negative PET scan 08/24/2013 (no evidence of active cancer). On Neurontin and Tramadol therapy.  Her subsequent history is as detailed below   PAST MEDICAL HISTORY: Past Medical History:  Diagnosis Date   Breast cancer ([place])    mets to liver and lung   Breast cancer metastasized to multiple sites [facility]) 02/26/2013   CHF (congestive heart failure) ([place])    History of chemotherapy Feb. 2006   taxotere/herceptin/carboplatin   Hypertension    Neuropathy    Radiation 07/31/2006   left upper chest   Radiation 06/17/2006-06/27/2006   6480 cGy bilat. chest wall   SVC syndrome    Thrombosis     PAST SURGICAL HISTORY: Past Surgical History:  Procedure Laterality Date   ANKLE SURGERY     BACK SURGERY     CHOLECYSTECTOMY  1989   MASTECTOMY Bilateral    PERIPHERALLY INSERTED CENTRAL CATHETER INSERTION     TUBAL LIGATION  1986    FAMILY HISTORY Family History  Problem Relation Age of Onset   Heart failure Father    Cancer Father        Prostate cancer   Heart failure Brother    Cancer Brother        Prostate cancer   Diabetes Maternal Aunt   She had three brothers, one died  of gunshot wound, one of complications of diabetes mellitus and one of myocardial infarction.  She has no sisters.  Mother died of complications of brain metastasis in 83.  Father has had a myocardial infarction in 1999.  No history of breast or ovarian cancer in the family.     GYNECOLOGIC HISTORY:    No LMP recorded. Patient is postmenopausal. Menarche at age 53.   Gravida 3, para 3.   First live birth at age 49.   No history of breast feeding. No history of hormonal replacement therapy.    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 Now, she is a caregiver to  her husband and grandchildren. He used to work as a Art gallery manager, but is now retired and disabled. The patient has three children, Monette who lives in [place] and works as a Hydrographic surveyor, Financial risk analyst who lives in [place] and works as a Administrator, and Golf who lives in [place] and also works as a Hydrographic surveyor. The patient has 14 grandchildren and 4 great-grandchildren. She attends a [employer].   ADVANCED DIRECTIVES: In the absence of any documentation to the contrary, the patient's spouse is their HCPOA.    HEALTH MAINTENANCE:  Social History   Tobacco Use   Smoking status: Never   Smokeless tobacco: Never  Substance Use Topics   Alcohol use: Yes    Comment: occasional   Drug use: No     Colonoscopy: Never and "I don't want one"  PAP:  1987  Bone density:  Never   Lipid panel:  Not on file   Allergies  Allergen Reactions   Penicillins Hives    PATIENT HAS TOLERATED CEPHALOSPORINGS   Adhesive [Tape] Other (See Comments)    Tears skin     Current Outpatient Medications  Medication Sig Dispense Refill   acetaminophen (TYLENOL) 500 MG tablet Take 1,000 mg by mouth every 6 (six) hours as needed for mild pain or fever.      albuterol (PROVENTIL HFA;VENTOLIN HFA) 108 (90 BASE) MCG/ACT inhaler Inhale 2 puffs into the lungs every 6 (six) hours as needed for wheezing. 1 Inhaler 5   ALPRAZolam (XANAX) 1 MG tablet Take 1 tablet (1 mg total) by mouth 3 (three) times daily as needed. for anxiety 60 tablet 0   amLODipine (NORVASC) 10 MG tablet Take 1 tablet (10 mg total) by mouth every morning. 30 tablet 4   baclofen (LIORESAL) 10 MG tablet TAKE 1 TABLET BY MOUTH THREE TIMES DAILY AS NEEDED FOR MUSCLE SPASMS 270 tablet 0   carvedilol (COREG) 12.5 MG tablet TAKE 1 TABLET(12.5 MG) BY MOUTH TWICE DAILY 60 tablet 6   diclofenac sodium (VOLTAREN) 1 % GEL Apply 2 g topically daily as needed (for pain). Apply to knees and shoulders 100 g 6   furosemide (LASIX) 40 MG tablet TAKE  1 TABLET(40 MG) BY MOUTH DAILY 30 tablet 3   gabapentin (NEURONTIN) 300 MG capsule TAKE 2 CAPSULES(600 MG) BY MOUTH THREE TIMES DAILY 540 capsule 0   losartan (COZAAR) 100 MG tablet Take 100 mg by mouth every morning.     oxyCODONE-acetaminophen (PERCOCET) 10-325 MG tablet Take 1 tablet by mouth 4 (four) times daily as needed.     potassium chloride (K-DUR,KLOR-CON) 20 MEQ tablet Take 1 tablet (20 mEq total) by mouth daily. 30 tablet 3   predniSONE (DELTASONE) 5 MG tablet      spironolactone (ALDACTONE)  25 MG tablet TAKE 1 TABLET(25 MG) BY MOUTH DAILY 30 tablet 11   temazepam (RESTORIL) 30 MG capsule TAKE ONE CAPSULE BY MOUTH EVERY NIGHT AT BEDTIME AS NEEDED FOR SLEEP 30 capsule 2   warfarin (COUMADIN) 5 MG tablet TAKE 1 TABLET BY MOUTH ON ODD NUMBERED DAYS PLUS 1 AND 1/2 TABLETS ON EVEN NUMBERED DAYS 90 tablet 4   No current facility-administered medications for this visit.   Facility-Administered Medications Ordered in Other Visits  Medication Dose Route Frequency Provider Last Rate Last Admin   sodium chloride flush (NS) 0.9 % injection 10 mL  10 mL Intravenous PRN Magrinat, Virgie Dad, MD   10 mL at 12/15/15 1200   sodium chloride flush (NS) 0.9 % injection 10 mL  10 mL Intracatheter PRN Magrinat, Virgie Dad, MD   10 mL at 08/14/18 1116   sodium chloride flush (NS) 0.9 % injection 10 mL  10 mL Intracatheter PRN Magrinat, Virgie Dad, MD        OBJECTIVE: African-American [name] in no acute distress  Vitals:   02/02/21 1020  BP: (!) 142/64  Pulse: 65  Resp: 18  Temp: (!) 97 F (36.1 C)  SpO2: 96%     Body mass index is 49.28 kg/m.    ECOG FS: 2 Filed Weights   02/02/21 1020  Weight: 278 lb 3.2 oz (126.2 kg)   GENERAL: Patient is a well appearing female in no acute distress HEENT:  Sclerae anicteric.  Oropharynx clear and moist. No ulcerations or evidence of oropharyngeal candidiasis. Neck is supple.  NODES:  No cervical, supraclavicular, or axillary lymphadenopathy palpated.  BREAST  EXAM: s/p bilateral mastectomies and radiation, no sign of local recurrence LUNGS:  Clear to auscultation bilaterally.  No wheezes or rhonchi. HEART:  Regular rate and rhythm. No murmur appreciated. ABDOMEN:  Soft, nontender.  Positive, normoactive bowel sounds. No organomegaly palpated. MSK:  No focal spinal tenderness to palpation. Full range of motion bilaterally in the upper extremities. EXTREMITIES:  No peripheral edema.   SKIN:  Clear with no obvious rashes or skin changes. No nail dyscrasia. NEURO:  Nonfocal. Well oriented.  Appropriate affect.     LAB RESULTS: Lab Results  Component Value Date   WBC 10.0 02/02/2021   NEUTROABS 8.2 (H) 02/02/2021   HGB 11.6 (L) 02/02/2021   HCT 33.8 (L) 02/02/2021   MCV 84.1 02/02/2021   PLT 271 02/02/2021      Chemistry      Component Value Date/Time   NA 143 02/02/2021 0955   NA 143 08/14/2017 0811   K 4.4 02/02/2021 0955   K 3.8 08/14/2017 0811   CL 106 02/02/2021 0955   CL 104 01/30/2013 0850   CO2 29 02/02/2021 0955   CO2 24 08/14/2017 0811   BUN 27 (H) 02/02/2021 0955   BUN 14.9 08/14/2017 0811   CREATININE 0.98 02/02/2021 0955   CREATININE 0.87 05/18/2020 0846   CREATININE 0.9 08/14/2017 0811      Component Value Date/Time   CALCIUM 9.6 02/02/2021 0955   CALCIUM 9.4 08/14/2017 0811   ALKPHOS 57 02/02/2021 0955   ALKPHOS 73 08/14/2017 0811   AST 9 (L) 02/02/2021 0955   AST 10 (L) 05/18/2020 0846   AST 10 08/14/2017 0811   ALT 10 02/02/2021 0955   ALT 9 05/18/2020 0846   ALT 10 08/14/2017 0811   BILITOT 0.6 02/02/2021 0955   BILITOT 0.6 05/18/2020 0846   BILITOT 0.62 08/14/2017 0811      STUDIES:  No results found.   ASSESSMENT: 70 y.o.  [name], [name], [name]  (1)  with a history of inflammatory right breast cancer metastatic at presentation September 2004 with involvement of liver and bone, HER-2 positive, estrogen and progesterone receptor negative  (2) treated with carboplatin, docetaxel and  Herceptin x 6 completed April 2005  (3) trastuzumab continued indefinitely;   (a) has also received lapatinib and capecitabine for variable intervals in 2007-2008.  (b) Every 6 month echo: 09/24/2017 showed an ejection fraction in the 55-60%  (c) echocardiogram 03/07/2018 showed an ejection fraction in the 55-60%  (d) cardiogram 09/30/2018 showed ejection fraction in the 60-65% range.  (e) echocardiogram November 30, 2019 showed an ejection fraction in the 55-60% range  (f) echo 08/31/2020  (4) status post bilateral mastectomies with bilateral axillary lymph node dissection 12/07/2004, showing  (a) on the right, a mypT1c ypN1 invasive ductal carcinoma, grade 3, estrogen and progesterone receptor negative, HER-2 positive, with an MIB-1 of 31%  (b) on the left, ypT2 ypN1 invasive ductal carcinoma, grade 2, estrogen and progesterone receptor negative, HER-2 positive, with an MIB-1 of 35%.  (5)  Status post radiation June through July of 2006, to the right chest wall, left chest wall, bilateral supraclavicular fossae, and bilateral axillary boosts; with additional radiation to the right and left chest walls and the central chest wall completed November of 2007  (6) status post Ixempra x9 completed August of 2009.  (7) history of superior vena caval syndrome, on life long anticoagulation   (8)  History of chemotherapy-induced neuropathy.   (9)  chronic pain, with negative PET scan 08/24/2013 (no evidence of active cancer). On Neurontin and Tramadol  (a) repeat PET scan December 2016 again negative.  (b) Repeat PET scan 04/23/2017 shows no active malignancy  (c) PET scan 07/09/2018 shows no evidence of active malignancy.  (d) PET scan 12/26/2018 shows no evidence of active disease  (e) chest CT 08/05/2020 shows no evidence of progressive disease  (10) right upper extremity cellulitis, no bacteremia; treated with cephalexin /doxycycline for 2 weeks, with resolution  (11) subacute fracture right  humerus, nondisplaced, status post fall 03/27/2017   PLAN: [name] is here today for f/u of her metastatic HER-2 positive breast cancer.  She is doing quite well.  She is undergoing imaging annually, most recently in 07/2020. She will continue on Trastuzumab every 4 weeks which she is tolerating quite well.    She and I discussed her headaches.  She thinks they are related to stress and she is managing these.  I reviewed her previous imaging studies.  She has not undergone any brian imaging.  I recommended that she consider undergoing brain MRI.  She is fearful of this, and said she would like to discuss it with her family and will let us know if she decides to proceed with the MRI.  Since her headaches have been worsening and she has a h/o HER-2 positive breast cancer, it would be good to get some imaging to rule out anything worrisome in the brain.  She understands this and will let us know if we can proceed with ordering it.    [name] and I discussed her echo.  She underwent an echo in January that was normal and she follows with Dr. Aundra Dubin.  Per Dr. Aundra Dubin she will repeat that echo in 04/2021.  I shared this information with the nursing staff that she is ok to treat with her echo from January.    [name] will continue  on Trastuzumab every 4 weeks.  We will see her back in 12 weeks.  She knows to call for any questions that may arise between now and her next appointment.  We are happy to see her sooner if needed.    Total encounter time 30 minutes.* in face to face visit time, chart review, treatment review, collaboration and coordination with nursing staff, and documentation of the encounter.  Yolanda Bihari, NP 02/02/21 10:30 PM Medical Oncology and Hematology [facility] [place], [place] [address] Tel. [phone]    Fax. [phone]    *Total Encounter Time as defined by the Centers for Medicare and Medicaid Services includes, in addition to the  face-to-face time of a patient visit (documented in the note above) non-face-to-face time: obtaining and reviewing outside history, ordering and reviewing medications, tests or procedures, care coordination (communications with other health care professionals or caregivers) and documentation in the medical record.

## 2021-02-02 ENCOUNTER — Encounter: Payer: Self-pay | Admitting: Adult Health

## 2021-02-02 ENCOUNTER — Inpatient Hospital Stay: Payer: Medicare Other | Attending: Oncology | Admitting: Adult Health

## 2021-02-02 ENCOUNTER — Inpatient Hospital Stay: Payer: Medicare Other

## 2021-02-02 ENCOUNTER — Other Ambulatory Visit: Payer: Self-pay

## 2021-02-02 ENCOUNTER — Other Ambulatory Visit: Payer: Medicare Other

## 2021-02-02 DIAGNOSIS — I871 Compression of vein: Secondary | ICD-10-CM | POA: Insufficient documentation

## 2021-02-02 DIAGNOSIS — Z7901 Long term (current) use of anticoagulants: Secondary | ICD-10-CM | POA: Diagnosis not present

## 2021-02-02 DIAGNOSIS — C50812 Malignant neoplasm of overlapping sites of left female breast: Secondary | ICD-10-CM | POA: Diagnosis not present

## 2021-02-02 DIAGNOSIS — Z5112 Encounter for antineoplastic immunotherapy: Secondary | ICD-10-CM | POA: Diagnosis not present

## 2021-02-02 DIAGNOSIS — Z171 Estrogen receptor negative status [ER-]: Secondary | ICD-10-CM | POA: Diagnosis not present

## 2021-02-02 DIAGNOSIS — C50919 Malignant neoplasm of unspecified site of unspecified female breast: Secondary | ICD-10-CM

## 2021-02-02 DIAGNOSIS — C787 Secondary malignant neoplasm of liver and intrahepatic bile duct: Secondary | ICD-10-CM | POA: Diagnosis not present

## 2021-02-02 LAB — COMPREHENSIVE METABOLIC PANEL
ALT: 10 U/L (ref 0–44)
AST: 9 U/L — ABNORMAL LOW (ref 15–41)
Albumin: 3.5 g/dL (ref 3.5–5.0)
Alkaline Phosphatase: 57 U/L (ref 38–126)
Anion gap: 8 (ref 5–15)
BUN: 27 mg/dL — ABNORMAL HIGH (ref 8–23)
CO2: 29 mmol/L (ref 22–32)
Calcium: 9.6 mg/dL (ref 8.9–10.3)
Chloride: 106 mmol/L (ref 98–111)
Creatinine, Ser: 0.98 mg/dL (ref 0.44–1.00)
GFR, Estimated: >60 mL/min (ref >60)
Glucose, Bld: 114 mg/dL — ABNORMAL HIGH (ref 70–99)
Potassium: 4.4 mmol/L (ref 3.5–5.1)
Sodium: 143 mmol/L (ref 135–145)
Total Bilirubin: 0.6 mg/dL (ref 0.3–1.2)
Total Protein: 7.3 g/dL (ref 6.5–8.1)

## 2021-02-02 LAB — CBC WITH DIFFERENTIAL/PLATELET
Abs Immature Granulocytes: 0.03 10*3/uL (ref 0.00–0.07)
Basophils Absolute: 0 10*3/uL (ref 0.0–0.1)
Basophils Relative: 0 %
Eosinophils Absolute: 0 10*3/uL (ref 0.0–0.5)
Eosinophils Relative: 0 %
HCT: 33.8 % — ABNORMAL LOW (ref 36.0–46.0)
Hemoglobin: 11.6 g/dL — ABNORMAL LOW (ref 12.0–15.0)
Immature Granulocytes: 0 %
Lymphocytes Relative: 13 %
Lymphs Abs: 1.3 10*3/uL (ref 0.7–4.0)
MCH: 28.9 pg (ref 26.0–34.0)
MCHC: 34.3 g/dL (ref 30.0–36.0)
MCV: 84.1 fL (ref 80.0–100.0)
Monocytes Absolute: 0.5 10*3/uL (ref 0.1–1.0)
Monocytes Relative: 5 %
Neutro Abs: 8.2 10*3/uL — ABNORMAL HIGH (ref 1.7–7.7)
Neutrophils Relative %: 82 %
Platelets: 271 10*3/uL (ref 150–400)
RBC: 4.02 MIL/uL (ref 3.87–5.11)
RDW: 15.2 % (ref 11.5–15.5)
WBC: 10 10*3/uL (ref 4.0–10.5)
nRBC: 0 % (ref 0.0–0.2)

## 2021-02-02 LAB — PROTIME-INR
INR: 2.5 — ABNORMAL HIGH (ref 0.8–1.2)
Prothrombin Time: 27.4 seconds — ABNORMAL HIGH (ref 11.4–15.2)

## 2021-02-02 MED ORDER — SODIUM CHLORIDE 0.9 % IV SOLN
Freq: Once | INTRAVENOUS | Status: AC
Start: 2021-02-02 — End: 2021-02-02
  Filled 2021-02-02: qty 250

## 2021-02-02 MED ORDER — SODIUM CHLORIDE 0.9% FLUSH
10.0000 mL | INTRAVENOUS | Status: DC | PRN
  Filled 2021-02-02: qty 10

## 2021-02-02 MED ORDER — TEMAZEPAM 30 MG PO CAPS: ORAL_CAPSULE | ORAL | 2 refills | Status: DC

## 2021-02-02 MED ORDER — DIPHENHYDRAMINE HCL 25 MG PO CAPS
25.0000 mg | ORAL_CAPSULE | Freq: Once | ORAL | Status: AC
  Administered 2021-02-02: 25 mg via ORAL

## 2021-02-02 MED ORDER — ALPRAZOLAM 1 MG PO TABS: 1.0000 mg | ORAL_TABLET | Freq: Three times a day (TID) | ORAL | 0 refills | Status: DC | PRN

## 2021-02-02 MED ORDER — TRASTUZUMAB-DKST CHEMO 150 MG IV SOLR
750.0000 mg | Freq: Once | INTRAVENOUS | Status: AC
  Administered 2021-02-02: 750 mg via INTRAVENOUS
  Filled 2021-02-02: qty 35.72

## 2021-02-02 MED ORDER — SODIUM CHLORIDE 0.9% FLUSH
10.0000 mL | INTRAVENOUS | Status: DC | PRN
Start: 2021-02-02 — End: 2021-02-02
  Administered 2021-02-02: 10 mL
  Filled 2021-02-02: qty 10

## 2021-02-02 MED ORDER — HEPARIN SOD (PORK) LOCK FLUSH 100 UNIT/ML IV SOLN
500.0000 [IU] | Freq: Once | INTRAVENOUS | Status: AC | PRN
  Administered 2021-02-02: 500 [IU]
  Filled 2021-02-02: qty 5

## 2021-02-02 MED ORDER — LORAZEPAM 2 MG/ML IJ SOLN: 1.0000 mg | Freq: Once | INTRAMUSCULAR | Status: DC | PRN

## 2021-02-02 MED ORDER — ACETAMINOPHEN 325 MG PO TABS
650.0000 mg | ORAL_TABLET | Freq: Once | ORAL | Status: AC
  Administered 2021-02-02: 650 mg via ORAL

## 2021-02-02 NOTE — Patient Instructions (Signed)
[facility]ICAL [facility]  Discharge Instructions: Thank you for choosing [facility] to provide your oncology and hematology care.   If you have a lab appointment with the Cancer Center, please go directly to the Cancer Center and check in at the registration area.   Wear comfortable clothing and clothing appropriate for easy access to any Portacath or PICC line.   We strive to give you quality time with your provider. You may need to reschedule your appointment if you arrive late (15 or more minutes).  Arriving late affects you and other patients whose appointments are after yours.  Also, if you miss three or more appointments without notifying the office, you may be dismissed from the clinic at the provider's discretion.      For prescription refill requests, have your pharmacy contact our office and allow 72 hours for refills to be completed.    Today you received the following chemotherapy and/or immunotherapy agents trastuzumab      To help prevent nausea and vomiting after your treatment, we encourage you to take your nausea medication as directed.  BELOW ARE SYMPTOMS THAT SHOULD BE REPORTED IMMEDIATELY: *FEVER GREATER THAN 100.4 F (38 C) OR HIGHER *CHILLS OR SWEATING *NAUSEA AND VOMITING THAT IS NOT CONTROLLED WITH YOUR NAUSEA MEDICATION *UNUSUAL SHORTNESS OF BREATH *UNUSUAL BRUISING OR BLEEDING *URINARY PROBLEMS (pain or burning when urinating, or frequent urination) *BOWEL PROBLEMS (unusual diarrhea, constipation, pain near the anus) TENDERNESS IN MOUTH AND THROAT WITH OR WITHOUT PRESENCE OF ULCERS (sore throat, sores in mouth, or a toothache) UNUSUAL RASH, SWELLING OR PAIN  UNUSUAL VAGINAL DISCHARGE OR ITCHING   Items with * indicate a potential emergency and should be followed up as soon as possible or go to the Emergency Department if any problems should occur.  Please show the CHEMOTHERAPY ALERT CARD or IMMUNOTHERAPY ALERT CARD at check-in to  the Emergency Department and triage nurse.  Should you have questions after your visit or need to cancel or reschedule your appointment, please contact [facility]  Dept: [phone]  and follow the prompts.  Office hours are 8:00 a.m. to 4:30 p.m. Monday - Friday. Please note that voicemails left after 4:00 p.m. may not be returned until the following business day.  We are closed weekends and major holidays. You have access to a nurse at all times for urgent questions. Please call the main number to the clinic Dept: [phone] and follow the prompts.   For any non-urgent questions, you may also contact your provider using MyChart. We now offer e-Visits for anyone 18 and older to request care online for non-urgent symptoms. For details visit [email or web address].   Also download the MyChart app! Go to the app store, search "MyChart", open the app, select , and log in with your MyChart username and password.  Due to Covid, a mask is required upon entering the hospital/clinic. If you do not have a mask, one will be given to you upon arrival. For doctor visits, patients may have 1 support person aged 18 or older with them. For treatment visits, patients cannot have anyone with them due to current Covid guidelines and our immunocompromised population.   

## 2021-02-02 NOTE — Progress Notes (Signed)
Per cardiology note from 11/17/2020, patient only needs echocardiogram every 6 months.

## 2021-02-04 DIAGNOSIS — Z23 Encounter for immunization: Secondary | ICD-10-CM | POA: Diagnosis not present

## 2021-02-07 ENCOUNTER — Telehealth: Payer: Self-pay | Admitting: Oncology

## 2021-02-07 NOTE — Telephone Encounter (Signed)
Scheduled per 6/9 los. Called and spoke with pt confirmed 7/7 appts

## 2021-02-18 ENCOUNTER — Other Ambulatory Visit: Payer: Self-pay | Admitting: Oncology

## 2021-02-20 ENCOUNTER — Encounter: Payer: Self-pay | Admitting: Oncology

## 2021-03-02 ENCOUNTER — Ambulatory Visit: Payer: Medicare Other

## 2021-03-02 ENCOUNTER — Other Ambulatory Visit: Payer: Medicare Other

## 2021-03-02 ENCOUNTER — Inpatient Hospital Stay: Payer: Medicare Other

## 2021-03-02 ENCOUNTER — Other Ambulatory Visit: Payer: Self-pay | Admitting: *Deleted

## 2021-03-02 ENCOUNTER — Inpatient Hospital Stay: Payer: Medicare Other | Attending: Oncology

## 2021-03-02 ENCOUNTER — Other Ambulatory Visit: Payer: Self-pay

## 2021-03-02 VITALS — BP 134/82 | HR 69 | Temp 98.9°F | Resp 16 | Wt 275.8 lb

## 2021-03-02 DIAGNOSIS — C50011 Malignant neoplasm of nipple and areola, right female breast: Secondary | ICD-10-CM

## 2021-03-02 DIAGNOSIS — I871 Compression of vein: Secondary | ICD-10-CM | POA: Insufficient documentation

## 2021-03-02 DIAGNOSIS — Z5112 Encounter for antineoplastic immunotherapy: Secondary | ICD-10-CM | POA: Diagnosis not present

## 2021-03-02 DIAGNOSIS — C50812 Malignant neoplasm of overlapping sites of left female breast: Secondary | ICD-10-CM | POA: Diagnosis not present

## 2021-03-02 DIAGNOSIS — C50911 Malignant neoplasm of unspecified site of right female breast: Secondary | ICD-10-CM | POA: Diagnosis not present

## 2021-03-02 DIAGNOSIS — C50919 Malignant neoplasm of unspecified site of unspecified female breast: Secondary | ICD-10-CM

## 2021-03-02 DIAGNOSIS — Z7901 Long term (current) use of anticoagulants: Secondary | ICD-10-CM | POA: Diagnosis not present

## 2021-03-02 DIAGNOSIS — Z171 Estrogen receptor negative status [ER-]: Secondary | ICD-10-CM

## 2021-03-02 LAB — CBC WITH DIFFERENTIAL/PLATELET
Abs Immature Granulocytes: 0.03 10*3/uL (ref 0.00–0.07)
Basophils Absolute: 0.1 10*3/uL (ref 0.0–0.1)
Basophils Relative: 1 %
Eosinophils Absolute: 0.1 10*3/uL (ref 0.0–0.5)
Eosinophils Relative: 1 %
HCT: 35 % — ABNORMAL LOW (ref 36.0–46.0)
Hemoglobin: 11.9 g/dL — ABNORMAL LOW (ref 12.0–15.0)
Immature Granulocytes: 0 %
Lymphocytes Relative: 28 %
Lymphs Abs: 2.4 10*3/uL (ref 0.7–4.0)
MCH: 28.2 pg (ref 26.0–34.0)
MCHC: 34 g/dL (ref 30.0–36.0)
MCV: 82.9 fL (ref 80.0–100.0)
Monocytes Absolute: 0.7 10*3/uL (ref 0.1–1.0)
Monocytes Relative: 8 %
Neutro Abs: 5.3 10*3/uL (ref 1.7–7.7)
Neutrophils Relative %: 62 %
Platelets: 281 10*3/uL (ref 150–400)
RBC: 4.22 MIL/uL (ref 3.87–5.11)
RDW: 14.9 % (ref 11.5–15.5)
WBC: 8.6 10*3/uL (ref 4.0–10.5)
nRBC: 0 % (ref 0.0–0.2)

## 2021-03-02 LAB — COMPREHENSIVE METABOLIC PANEL
ALT: 9 U/L (ref 0–44)
AST: 11 U/L — ABNORMAL LOW (ref 15–41)
Albumin: 3.4 g/dL — ABNORMAL LOW (ref 3.5–5.0)
Alkaline Phosphatase: 75 U/L (ref 38–126)
Anion gap: 9 (ref 5–15)
BUN: 17 mg/dL (ref 8–23)
CO2: 26 mmol/L (ref 22–32)
Calcium: 9.7 mg/dL (ref 8.9–10.3)
Chloride: 107 mmol/L (ref 98–111)
Creatinine, Ser: 1.01 mg/dL — ABNORMAL HIGH (ref 0.44–1.00)
GFR, Estimated: >60 mL/min (ref >60)
Glucose, Bld: 82 mg/dL (ref 70–99)
Potassium: 4.1 mmol/L (ref 3.5–5.1)
Sodium: 142 mmol/L (ref 135–145)
Total Bilirubin: 0.6 mg/dL (ref 0.3–1.2)
Total Protein: 7.6 g/dL (ref 6.5–8.1)

## 2021-03-02 LAB — PROTIME-INR
INR: 1.3 — ABNORMAL HIGH (ref 0.8–1.2)
Prothrombin Time: 15.8 seconds — ABNORMAL HIGH (ref 11.4–15.2)

## 2021-03-02 MED ORDER — LORAZEPAM 1 MG PO TABS
1.0000 mg | ORAL_TABLET | Freq: Once | ORAL | Status: AC | PRN
Start: 2021-03-02 — End: 2021-03-02
  Administered 2021-03-02: 1 mg via ORAL

## 2021-03-02 MED ORDER — SODIUM CHLORIDE 0.9 % IV SOLN
Freq: Once | INTRAVENOUS | Status: AC
  Filled 2021-03-02: qty 250

## 2021-03-02 MED ORDER — DIPHENHYDRAMINE HCL 25 MG PO CAPS
25.0000 mg | ORAL_CAPSULE | Freq: Once | ORAL | Status: AC
  Administered 2021-03-02: 25 mg via ORAL

## 2021-03-02 MED ORDER — LORAZEPAM 2 MG/ML IJ SOLN: 1.0000 mg | Freq: Once | INTRAMUSCULAR | Status: DC | PRN

## 2021-03-02 MED ORDER — HEPARIN SOD (PORK) LOCK FLUSH 100 UNIT/ML IV SOLN
500.0000 [IU] | Freq: Once | INTRAVENOUS | Status: AC | PRN
  Administered 2021-03-02: 500 [IU]
  Filled 2021-03-02: qty 5

## 2021-03-02 MED ORDER — LORAZEPAM 1 MG PO TABS
ORAL_TABLET | ORAL | Status: AC
  Filled 2021-03-02: qty 1

## 2021-03-02 MED ORDER — ACETAMINOPHEN 325 MG PO TABS
650.0000 mg | ORAL_TABLET | Freq: Once | ORAL | Status: AC
  Administered 2021-03-02: 650 mg via ORAL

## 2021-03-02 MED ORDER — DIPHENHYDRAMINE HCL 25 MG PO CAPS
ORAL_CAPSULE | ORAL | Status: AC
  Filled 2021-03-02: qty 1

## 2021-03-02 MED ORDER — SODIUM CHLORIDE 0.9 % IV SOLN
750.0000 mg | Freq: Once | INTRAVENOUS | Status: AC
  Administered 2021-03-02: 750 mg via INTRAVENOUS
  Filled 2021-03-02: qty 35.72

## 2021-03-02 MED ORDER — ACETAMINOPHEN 325 MG PO TABS
ORAL_TABLET | ORAL | Status: AC
  Filled 2021-03-02: qty 2

## 2021-03-02 MED ORDER — SODIUM CHLORIDE 0.9% FLUSH
10.0000 mL | INTRAVENOUS | Status: DC | PRN
  Administered 2021-03-02: 10 mL
  Filled 2021-03-02: qty 10

## 2021-03-02 NOTE — Progress Notes (Signed)
Mrs. Lehan said Mendel Ryder is planning to get a CT Head d/t frequent headaches. The patient is now asking for a CT Chest to include views of bilateral shoulders d/t intermittent pain. Also, she wants bilateral knee x-rays d/t chronic knee pain.

## 2021-03-02 NOTE — Patient Instructions (Signed)
[facility]  Discharge Instructions: Thank you for choosing [facility] to provide your oncology and hematology care.   If you have a lab appointment with the [facility], please go directly to the [facility] and check in at the registration area.   Wear comfortable clothing and clothing appropriate for easy access to any Portacath or PICC line.   We strive to give you quality time with your provider. You may need to reschedule your appointment if you arrive late (15 or more minutes).  Arriving late affects you and other patients whose appointments are after yours.  Also, if you miss three or more appointments without notifying the office, you may be dismissed from the clinic at the provider's discretion.      For prescription refill requests, have your pharmacy contact our office and allow 72 hours for refills to be completed.    Today you received the following chemotherapy and/or immunotherapy agents Trastuzumab-dkst (Ogiviri).      To help prevent nausea and vomiting after your treatment, we encourage you to take your nausea medication as directed.  BELOW ARE SYMPTOMS THAT SHOULD BE REPORTED IMMEDIATELY: *FEVER GREATER THAN 100.4 F (38 C) OR HIGHER *CHILLS OR SWEATING *NAUSEA AND VOMITING THAT IS NOT CONTROLLED WITH YOUR NAUSEA MEDICATION *UNUSUAL SHORTNESS OF BREATH *UNUSUAL BRUISING OR BLEEDING *URINARY PROBLEMS (pain or burning when urinating, or frequent urination) *BOWEL PROBLEMS (unusual diarrhea, constipation, pain near the anus) TENDERNESS IN MOUTH AND THROAT WITH OR WITHOUT PRESENCE OF ULCERS (sore throat, sores in mouth, or a toothache) UNUSUAL RASH, SWELLING OR PAIN  UNUSUAL VAGINAL DISCHARGE OR ITCHING   Items with * indicate a potential emergency and should be followed up as soon as possible or go to the Emergency Department if any problems should occur.  Please show the CHEMOTHERAPY ALERT CARD or IMMUNOTHERAPY ALERT  CARD at check-in to the Emergency Department and triage nurse.  Should you have questions after your visit or need to cancel or reschedule your appointment, please contact [facility]  Dept: [phone]  and follow the prompts.  Office hours are 8:00 a.m. to 4:30 p.m. Monday - Friday. Please note that voicemails left after 4:00 p.m. may not be returned until the following business day.  We are closed weekends and major holidays. You have access to a nurse at all times for urgent questions. Please call the main number to the clinic Dept: [phone] and follow the prompts.   For any non-urgent questions, you may also contact your provider using MyChart. We now offer e-Visits for anyone 72 and older to request care online for non-urgent symptoms. For details visit [email or web address].   Also download the MyChart app! Go to the app store, search "MyChart", open the app, select [facility], and log in with your MyChart username and password.  Due to Covid, a mask is required upon entering the hospital/clinic. If you do not have a mask, one will be given to you upon arrival. For doctor visits, patients may have 1 support person aged 81 or older with them. For treatment visits, patients cannot have anyone with them due to current Covid guidelines and our immunocompromised population.

## 2021-03-09 ENCOUNTER — Other Ambulatory Visit: Payer: Self-pay | Admitting: *Deleted

## 2021-03-09 DIAGNOSIS — C50919 Malignant neoplasm of unspecified site of unspecified female breast: Secondary | ICD-10-CM

## 2021-03-09 DIAGNOSIS — R519 Headache, unspecified: Secondary | ICD-10-CM

## 2021-03-09 DIAGNOSIS — R079 Chest pain, unspecified: Secondary | ICD-10-CM

## 2021-03-09 DIAGNOSIS — M25511 Pain in right shoulder: Secondary | ICD-10-CM

## 2021-03-09 DIAGNOSIS — C50911 Malignant neoplasm of unspecified site of right female breast: Secondary | ICD-10-CM

## 2021-03-09 DIAGNOSIS — R059 Cough, unspecified: Secondary | ICD-10-CM

## 2021-03-09 DIAGNOSIS — G8929 Other chronic pain: Secondary | ICD-10-CM

## 2021-03-09 MED ORDER — TEMAZEPAM 30 MG PO CAPS: ORAL_CAPSULE | ORAL | 0 refills | Status: DC

## 2021-03-09 MED ORDER — ALPRAZOLAM 1 MG PO TABS: 1.0000 mg | ORAL_TABLET | Freq: Three times a day (TID) | ORAL | 0 refills | Status: DC | PRN

## 2021-03-23 ENCOUNTER — Telehealth: Payer: Self-pay | Admitting: *Deleted

## 2021-03-23 NOTE — Telephone Encounter (Signed)
Pt left VM stating she would like to have additional scans added to currently scheduled scans.  She is asking for additional films of her back and hips.  This RN returned call - obtained identified VM - message left stating additional scans cannot be added to currently scheduled due to additional time not available- as well need for Prior auth - best to proceed with currently scheduled scans because if there are areas of concern then further further appropriate scans can be ordered.  This RN's name given for return call if needed

## 2021-03-24 ENCOUNTER — Ambulatory Visit (HOSPITAL_COMMUNITY)
Admission: RE | Admit: 2021-03-24 | Discharge: 2021-03-24 | Disposition: A | Discharge status: Home / Self Care | Payer: Medicare Other | Source: Ambulatory Visit | Attending: Adult Health | Admitting: Adult Health

## 2021-03-24 ENCOUNTER — Other Ambulatory Visit: Payer: Self-pay

## 2021-03-24 ENCOUNTER — Encounter (HOSPITAL_COMMUNITY): Payer: Self-pay

## 2021-03-24 DIAGNOSIS — G8929 Other chronic pain: Secondary | ICD-10-CM

## 2021-03-24 DIAGNOSIS — R519 Headache, unspecified: Secondary | ICD-10-CM | POA: Diagnosis not present

## 2021-03-24 DIAGNOSIS — R079 Chest pain, unspecified: Secondary | ICD-10-CM | POA: Diagnosis not present

## 2021-03-24 DIAGNOSIS — Z853 Personal history of malignant neoplasm of breast: Secondary | ICD-10-CM | POA: Diagnosis not present

## 2021-03-24 DIAGNOSIS — J701 Chronic and other pulmonary manifestations due to radiation: Secondary | ICD-10-CM | POA: Diagnosis not present

## 2021-03-24 DIAGNOSIS — K449 Diaphragmatic hernia without obstruction or gangrene: Secondary | ICD-10-CM | POA: Diagnosis not present

## 2021-03-24 DIAGNOSIS — J941 Fibrothorax: Secondary | ICD-10-CM | POA: Diagnosis not present

## 2021-03-24 DIAGNOSIS — R059 Cough, unspecified: Secondary | ICD-10-CM | POA: Diagnosis not present

## 2021-03-24 MED ORDER — IOHEXOL 350 MG/ML SOLN
100.0000 mL | Freq: Once | INTRAVENOUS | Status: AC | PRN
  Administered 2021-03-24: 75 mL via INTRAVENOUS

## 2021-03-29 DIAGNOSIS — I10 Essential (primary) hypertension: Secondary | ICD-10-CM | POA: Diagnosis not present

## 2021-03-29 DIAGNOSIS — T148XXA Other injury of unspecified body region, initial encounter: Secondary | ICD-10-CM | POA: Diagnosis not present

## 2021-03-29 DIAGNOSIS — F419 Anxiety disorder, unspecified: Secondary | ICD-10-CM | POA: Diagnosis not present

## 2021-03-29 DIAGNOSIS — Z1211 Encounter for screening for malignant neoplasm of colon: Secondary | ICD-10-CM | POA: Diagnosis not present

## 2021-03-29 DIAGNOSIS — M170 Bilateral primary osteoarthritis of knee: Secondary | ICD-10-CM | POA: Diagnosis not present

## 2021-03-30 ENCOUNTER — Other Ambulatory Visit: Payer: Self-pay

## 2021-03-30 ENCOUNTER — Inpatient Hospital Stay: Payer: Medicare Other

## 2021-03-30 ENCOUNTER — Inpatient Hospital Stay: Payer: Medicare Other | Attending: Oncology

## 2021-03-30 VITALS — BP 151/75 | HR 63 | Temp 98.8°F | Resp 18 | Wt 276.0 lb

## 2021-03-30 DIAGNOSIS — C50919 Malignant neoplasm of unspecified site of unspecified female breast: Secondary | ICD-10-CM

## 2021-03-30 DIAGNOSIS — C50911 Malignant neoplasm of unspecified site of right female breast: Secondary | ICD-10-CM | POA: Insufficient documentation

## 2021-03-30 DIAGNOSIS — C50812 Malignant neoplasm of overlapping sites of left female breast: Secondary | ICD-10-CM | POA: Diagnosis not present

## 2021-03-30 DIAGNOSIS — C50012 Malignant neoplasm of nipple and areola, left female breast: Secondary | ICD-10-CM

## 2021-03-30 DIAGNOSIS — C50011 Malignant neoplasm of nipple and areola, right female breast: Secondary | ICD-10-CM

## 2021-03-30 DIAGNOSIS — Z5112 Encounter for antineoplastic immunotherapy: Secondary | ICD-10-CM | POA: Diagnosis not present

## 2021-03-30 DIAGNOSIS — Z7901 Long term (current) use of anticoagulants: Secondary | ICD-10-CM | POA: Diagnosis not present

## 2021-03-30 LAB — CBC WITH DIFFERENTIAL/PLATELET
Abs Immature Granulocytes: 0.04 10*3/uL (ref 0.00–0.07)
Basophils Absolute: 0.1 10*3/uL (ref 0.0–0.1)
Basophils Relative: 1 %
Eosinophils Absolute: 0.1 10*3/uL (ref 0.0–0.5)
Eosinophils Relative: 1 %
HCT: 32.3 % — ABNORMAL LOW (ref 36.0–46.0)
Hemoglobin: 11.2 g/dL — ABNORMAL LOW (ref 12.0–15.0)
Immature Granulocytes: 0 %
Lymphocytes Relative: 19 %
Lymphs Abs: 2.1 10*3/uL (ref 0.7–4.0)
MCH: 28.5 pg (ref 26.0–34.0)
MCHC: 34.7 g/dL (ref 30.0–36.0)
MCV: 82.2 fL (ref 80.0–100.0)
Monocytes Absolute: 0.9 10*3/uL (ref 0.1–1.0)
Monocytes Relative: 8 %
Neutro Abs: 7.8 10*3/uL — ABNORMAL HIGH (ref 1.7–7.7)
Neutrophils Relative %: 71 %
Platelets: 267 10*3/uL (ref 150–400)
RBC: 3.93 MIL/uL (ref 3.87–5.11)
RDW: 15.2 % (ref 11.5–15.5)
WBC: 11 10*3/uL — ABNORMAL HIGH (ref 4.0–10.5)
nRBC: 0 % (ref 0.0–0.2)

## 2021-03-30 LAB — COMPREHENSIVE METABOLIC PANEL
ALT: 10 U/L (ref 0–44)
AST: 10 U/L — ABNORMAL LOW (ref 15–41)
Albumin: 3.4 g/dL — ABNORMAL LOW (ref 3.5–5.0)
Alkaline Phosphatase: 68 U/L (ref 38–126)
Anion gap: 9 (ref 5–15)
BUN: 30 mg/dL — ABNORMAL HIGH (ref 8–23)
CO2: 25 mmol/L (ref 22–32)
Calcium: 9.6 mg/dL (ref 8.9–10.3)
Chloride: 107 mmol/L (ref 98–111)
Creatinine, Ser: 1.02 mg/dL — ABNORMAL HIGH (ref 0.44–1.00)
GFR, Estimated: 60 mL/min — ABNORMAL LOW (ref >60)
Glucose, Bld: 90 mg/dL (ref 70–99)
Potassium: 4.6 mmol/L (ref 3.5–5.1)
Sodium: 141 mmol/L (ref 135–145)
Total Bilirubin: 1 mg/dL (ref 0.3–1.2)
Total Protein: 7.3 g/dL (ref 6.5–8.1)

## 2021-03-30 LAB — PROTIME-INR
INR: 2.2 — ABNORMAL HIGH (ref 0.8–1.2)
Prothrombin Time: 24.5 seconds — ABNORMAL HIGH (ref 11.4–15.2)

## 2021-03-30 MED ORDER — DIPHENHYDRAMINE HCL 25 MG PO CAPS
ORAL_CAPSULE | ORAL | Status: AC
  Filled 2021-03-30: qty 1

## 2021-03-30 MED ORDER — TRASTUZUMAB-DKST CHEMO 150 MG IV SOLR
750.0000 mg | Freq: Once | INTRAVENOUS | Status: AC
  Administered 2021-03-30: 750 mg via INTRAVENOUS
  Filled 2021-03-30: qty 35.72

## 2021-03-30 MED ORDER — LORAZEPAM 2 MG/ML IJ SOLN
1.0000 mg | Freq: Once | INTRAMUSCULAR | Status: AC | PRN
  Administered 2021-03-30: 1 mg via INTRAVENOUS

## 2021-03-30 MED ORDER — ACETAMINOPHEN 325 MG PO TABS
ORAL_TABLET | ORAL | Status: AC
  Filled 2021-03-30: qty 2

## 2021-03-30 MED ORDER — LORAZEPAM 2 MG/ML IJ SOLN
INTRAMUSCULAR | Status: AC
  Filled 2021-03-30: qty 1

## 2021-03-30 MED ORDER — HEPARIN SOD (PORK) LOCK FLUSH 100 UNIT/ML IV SOLN
500.0000 [IU] | Freq: Once | INTRAVENOUS | Status: AC | PRN
  Administered 2021-03-30: 500 [IU]
  Filled 2021-03-30: qty 5

## 2021-03-30 MED ORDER — ACETAMINOPHEN 325 MG PO TABS
650.0000 mg | ORAL_TABLET | Freq: Once | ORAL | Status: AC
  Administered 2021-03-30: 650 mg via ORAL

## 2021-03-30 MED ORDER — SODIUM CHLORIDE 0.9 % IV SOLN
Freq: Once | INTRAVENOUS | Status: AC
Start: 2021-03-30 — End: 2021-03-30
  Filled 2021-03-30: qty 250

## 2021-03-30 MED ORDER — SODIUM CHLORIDE 0.9% FLUSH
10.0000 mL | INTRAVENOUS | Status: DC | PRN
  Administered 2021-03-30: 10 mL
  Filled 2021-03-30: qty 10

## 2021-03-30 MED ORDER — DIPHENHYDRAMINE HCL 25 MG PO CAPS
25.0000 mg | ORAL_CAPSULE | Freq: Once | ORAL | Status: AC
  Administered 2021-03-30: 25 mg via ORAL

## 2021-03-30 NOTE — Patient Instructions (Signed)
[facility]  Discharge Instructions: Thank you for choosing [facility] to provide your oncology and hematology care.   If you have a lab appointment with the [facility], please go directly to the [facility] and check in at the registration area.   Wear comfortable clothing and clothing appropriate for easy access to any Portacath or PICC line.   We strive to give you quality time with your provider. You may need to reschedule your appointment if you arrive late (15 or more minutes).  Arriving late affects you and other patients whose appointments are after yours.  Also, if you miss three or more appointments without notifying the office, you may be dismissed from the clinic at the provider's discretion.      For prescription refill requests, have your pharmacy contact our office and allow 72 hours for refills to be completed.    Today you received the following chemotherapy and/or immunotherapy agents Trastuzumab-dkst (Ogiviri).      To help prevent nausea and vomiting after your treatment, we encourage you to take your nausea medication as directed.  BELOW ARE SYMPTOMS THAT SHOULD BE REPORTED IMMEDIATELY: *FEVER GREATER THAN 100.4 F (38 C) OR HIGHER *CHILLS OR SWEATING *NAUSEA AND VOMITING THAT IS NOT CONTROLLED WITH YOUR NAUSEA MEDICATION *UNUSUAL SHORTNESS OF BREATH *UNUSUAL BRUISING OR BLEEDING *URINARY PROBLEMS (pain or burning when urinating, or frequent urination) *BOWEL PROBLEMS (unusual diarrhea, constipation, pain near the anus) TENDERNESS IN MOUTH AND THROAT WITH OR WITHOUT PRESENCE OF ULCERS (sore throat, sores in mouth, or a toothache) UNUSUAL RASH, SWELLING OR PAIN  UNUSUAL VAGINAL DISCHARGE OR ITCHING   Items with * indicate a potential emergency and should be followed up as soon as possible or go to the Emergency Department if any problems should occur.  Please show the CHEMOTHERAPY ALERT CARD or IMMUNOTHERAPY ALERT  CARD at check-in to the Emergency Department and triage nurse.  Should you have questions after your visit or need to cancel or reschedule your appointment, please contact [facility]  Dept: [phone]  and follow the prompts.  Office hours are 8:00 a.m. to 4:30 p.m. Monday - Friday. Please note that voicemails left after 4:00 p.m. may not be returned until the following business day.  We are closed weekends and major holidays. You have access to a nurse at all times for urgent questions. Please call the main number to the clinic Dept: [phone] and follow the prompts.   For any non-urgent questions, you may also contact your provider using MyChart. We now offer e-Visits for anyone 54 and older to request care online for non-urgent symptoms. For details visit [email or web address].   Also download the MyChart app! Go to the app store, search "MyChart", open the app, select [facility], and log in with your MyChart username and password.  Due to Covid, a mask is required upon entering the hospital/clinic. If you do not have a mask, one will be given to you upon arrival. For doctor visits, patients may have 1 support person aged 54 or older with them. For treatment visits, patients cannot have anyone with them due to current Covid guidelines and our immunocompromised population.

## 2021-04-01 ENCOUNTER — Other Ambulatory Visit (HOSPITAL_COMMUNITY): Payer: Self-pay | Admitting: Cardiology

## 2021-04-07 ENCOUNTER — Telehealth: Payer: Self-pay

## 2021-04-07 NOTE — Telephone Encounter (Signed)
Patient notified.  RN encouraged patient to make log of headaches including severity, and any possible triggers such as stress and caffeine intake.  Patient verbalized understanding and agreement.

## 2021-04-07 NOTE — Telephone Encounter (Signed)
-----   Message from Gardenia Phlegm, NP sent at 04/04/2021 10:35 AM EDT ----- Please make sure patient saw her results for her CT scans, and that they look good :) ----- Message ----- From: Interface, Rad Results In Sent: 03/27/2021  10:47 AM EDT To: Gardenia Phlegm, NP

## 2021-04-12 ENCOUNTER — Other Ambulatory Visit: Payer: Self-pay | Admitting: Oncology

## 2021-04-12 DIAGNOSIS — C50919 Malignant neoplasm of unspecified site of unspecified female breast: Secondary | ICD-10-CM

## 2021-04-13 ENCOUNTER — Encounter: Payer: Self-pay | Admitting: Oncology

## 2021-04-27 ENCOUNTER — Other Ambulatory Visit: Payer: Self-pay

## 2021-04-27 ENCOUNTER — Inpatient Hospital Stay (HOSPITAL_BASED_OUTPATIENT_CLINIC_OR_DEPARTMENT_OTHER): Payer: Medicare Other | Admitting: Adult Health

## 2021-04-27 ENCOUNTER — Ambulatory Visit: Payer: Medicare Other

## 2021-04-27 ENCOUNTER — Other Ambulatory Visit: Payer: Medicare Other

## 2021-04-27 ENCOUNTER — Ambulatory Visit: Payer: Medicare Other | Admitting: Adult Health

## 2021-04-27 ENCOUNTER — Encounter: Payer: Self-pay | Admitting: Adult Health

## 2021-04-27 ENCOUNTER — Inpatient Hospital Stay: Payer: Medicare Other

## 2021-04-27 ENCOUNTER — Inpatient Hospital Stay: Payer: Medicare Other | Attending: Oncology

## 2021-04-27 VITALS — BP 164/74 | HR 55 | Temp 97.7°F | Resp 18 | Ht 63.0 in | Wt 276.2 lb

## 2021-04-27 DIAGNOSIS — I871 Compression of vein: Secondary | ICD-10-CM | POA: Insufficient documentation

## 2021-04-27 DIAGNOSIS — Z5112 Encounter for antineoplastic immunotherapy: Secondary | ICD-10-CM | POA: Insufficient documentation

## 2021-04-27 DIAGNOSIS — G629 Polyneuropathy, unspecified: Secondary | ICD-10-CM | POA: Diagnosis not present

## 2021-04-27 DIAGNOSIS — C787 Secondary malignant neoplasm of liver and intrahepatic bile duct: Secondary | ICD-10-CM | POA: Insufficient documentation

## 2021-04-27 DIAGNOSIS — C50812 Malignant neoplasm of overlapping sites of left female breast: Secondary | ICD-10-CM | POA: Diagnosis not present

## 2021-04-27 DIAGNOSIS — Z171 Estrogen receptor negative status [ER-]: Secondary | ICD-10-CM | POA: Diagnosis not present

## 2021-04-27 DIAGNOSIS — Z7901 Long term (current) use of anticoagulants: Secondary | ICD-10-CM | POA: Insufficient documentation

## 2021-04-27 DIAGNOSIS — C50012 Malignant neoplasm of nipple and areola, left female breast: Secondary | ICD-10-CM

## 2021-04-27 DIAGNOSIS — C50911 Malignant neoplasm of unspecified site of right female breast: Secondary | ICD-10-CM | POA: Diagnosis not present

## 2021-04-27 DIAGNOSIS — C50919 Malignant neoplasm of unspecified site of unspecified female breast: Secondary | ICD-10-CM

## 2021-04-27 DIAGNOSIS — C7800 Secondary malignant neoplasm of unspecified lung: Secondary | ICD-10-CM | POA: Insufficient documentation

## 2021-04-27 DIAGNOSIS — C50011 Malignant neoplasm of nipple and areola, right female breast: Secondary | ICD-10-CM

## 2021-04-27 LAB — CBC WITH DIFFERENTIAL/PLATELET
Abs Immature Granulocytes: 0.03 10*3/uL (ref 0.00–0.07)
Basophils Absolute: 0.1 10*3/uL (ref 0.0–0.1)
Basophils Relative: 1 %
Eosinophils Absolute: 0.1 10*3/uL (ref 0.0–0.5)
Eosinophils Relative: 1 %
HCT: 32.2 % — ABNORMAL LOW (ref 36.0–46.0)
Hemoglobin: 11.1 g/dL — ABNORMAL LOW (ref 12.0–15.0)
Immature Granulocytes: 0 %
Lymphocytes Relative: 23 %
Lymphs Abs: 2.1 10*3/uL (ref 0.7–4.0)
MCH: 28.6 pg (ref 26.0–34.0)
MCHC: 34.5 g/dL (ref 30.0–36.0)
MCV: 83 fL (ref 80.0–100.0)
Monocytes Absolute: 0.8 10*3/uL (ref 0.1–1.0)
Monocytes Relative: 8 %
Neutro Abs: 6.2 10*3/uL (ref 1.7–7.7)
Neutrophils Relative %: 67 %
Platelets: 234 10*3/uL (ref 150–400)
RBC: 3.88 MIL/uL (ref 3.87–5.11)
RDW: 15.2 % (ref 11.5–15.5)
WBC: 9.3 10*3/uL (ref 4.0–10.5)
nRBC: 0 % (ref 0.0–0.2)

## 2021-04-27 LAB — COMPREHENSIVE METABOLIC PANEL
ALT: 10 U/L (ref 0–44)
AST: 9 U/L — ABNORMAL LOW (ref 15–41)
Albumin: 3.4 g/dL — ABNORMAL LOW (ref 3.5–5.0)
Alkaline Phosphatase: 63 U/L (ref 38–126)
Anion gap: 7 (ref 5–15)
BUN: 18 mg/dL (ref 8–23)
CO2: 27 mmol/L (ref 22–32)
Calcium: 9.6 mg/dL (ref 8.9–10.3)
Chloride: 107 mmol/L (ref 98–111)
Creatinine, Ser: 0.98 mg/dL (ref 0.44–1.00)
GFR, Estimated: >60 mL/min (ref >60)
Glucose, Bld: 95 mg/dL (ref 70–99)
Potassium: 4.3 mmol/L (ref 3.5–5.1)
Sodium: 141 mmol/L (ref 135–145)
Total Bilirubin: 0.8 mg/dL (ref 0.3–1.2)
Total Protein: 7.1 g/dL (ref 6.5–8.1)

## 2021-04-27 LAB — PROTIME-INR
INR: 1.2 (ref 0.8–1.2)
Prothrombin Time: 15.3 seconds — ABNORMAL HIGH (ref 11.4–15.2)

## 2021-04-27 MED ORDER — DIPHENHYDRAMINE HCL 25 MG PO CAPS
25.0000 mg | ORAL_CAPSULE | Freq: Once | ORAL | Status: AC
  Administered 2021-04-27: 25 mg via ORAL
  Filled 2021-04-27: qty 1

## 2021-04-27 MED ORDER — SODIUM CHLORIDE 0.9% FLUSH
10.0000 mL | INTRAVENOUS | Status: DC | PRN
  Administered 2021-04-27: 10 mL

## 2021-04-27 MED ORDER — GABAPENTIN 300 MG PO CAPS: ORAL_CAPSULE | ORAL | 0 refills | Status: DC

## 2021-04-27 MED ORDER — SODIUM CHLORIDE 0.9 % IV SOLN: Freq: Once | INTRAVENOUS | Status: AC

## 2021-04-27 MED ORDER — TEMAZEPAM 30 MG PO CAPS: ORAL_CAPSULE | ORAL | 0 refills | Status: DC

## 2021-04-27 MED ORDER — TRASTUZUMAB-DKST CHEMO 150 MG IV SOLR
750.0000 mg | Freq: Once | INTRAVENOUS | Status: AC
  Administered 2021-04-27: 750 mg via INTRAVENOUS
  Filled 2021-04-27: qty 35.72

## 2021-04-27 MED ORDER — LORAZEPAM 2 MG/ML IJ SOLN: 1.0000 mg | Freq: Once | INTRAMUSCULAR | Status: DC | PRN

## 2021-04-27 MED ORDER — ACETAMINOPHEN 325 MG PO TABS
650.0000 mg | ORAL_TABLET | Freq: Once | ORAL | Status: AC
  Administered 2021-04-27: 650 mg via ORAL
  Filled 2021-04-27: qty 2

## 2021-04-27 MED ORDER — HEPARIN SOD (PORK) LOCK FLUSH 100 UNIT/ML IV SOLN
500.0000 [IU] | Freq: Once | INTRAVENOUS | Status: AC | PRN
  Administered 2021-04-27: 500 [IU]

## 2021-04-27 NOTE — Progress Notes (Signed)
[place]  Telephone:([phone] Fax:([phone]) [phone]    ID: [name]   DOB: 11-19-1950  MR#: [id number]  UTM#:[id number]   Patient Care Team: Ernestene Kiel, MD as PCP - General (Internal Medicine) Haze Rushing., MD as Consulting Physician (Pain Medicine) Joya Salm, MD as Referring Physician (Orthopedic Surgery) Jaymes Graff, DO as Consulting Physician (Orthopedic Surgery) Larey Dresser, MD as Consulting Physician (Cardiology) Bensimhon, Shaune Pascal, MD as Consulting Physician (Cardiology) Lorelle Gibbs, MD (Radiology) Tommy Medal, Lavell Islam, MD as Consulting Physician (Infectious Diseases) Gery Pray, MD as Consulting Physician (Radiation Oncology) Magrinat, Virgie Dad, MD as Consulting Physician (Oncology)   CHIEF COMPLAINT: Metastatic HER-2 positive, estrogen receptor negative breast cancer (s/p bilateral mastectomies)  CURRENT TREATMENT: Trastuzumab every 28 days; lifelong warfarin   INTERVAL HISTORY: Summers returns today for follow-up and treatment of her estrogen receptor negative, but HER2 amplified breast cancer.   She continues on trastuzumab, which she receives every 28 days.  She has absolutely no side effects related to this that she is aware of.  Shaeley has some arthritis that she finds bothersome, otherwise she is doing moderately well.  She is requesting refills on her Temazepam and Gabapentin today.  She is able to get her normal amount of sleep and her neuropathy is stable with these medications.  She is tolerating them well.    Raeanne unfortunately has had a few family members who have passed away recently, which has been stressful and difficult.  She has received our paperwork on advanced directives and plans to complete these and set up an appt with social work to discuss further.   Her most recent echocardiogram was completed on 08/31/2020 showing an ejection fraction of 60-65%. She sees Dr. Aundra Dubin and is scheduled  for repeat echocardiogram on 05/10/2021.    She is on lifelong warfarin. She tolerates this well and without any noticeable side effects.  INR today is pending.  Cyann underwent repeat imaging on 03/24/2021 and 03/25/2021 with CT chest and CT brain.  There was no sign of metastatic disease noted on her scans.  She was noted to have some radiation fibrosis in her anterior upper lobes bilaterally.    REVIEW OF SYSTEMS: Review of Systems  Constitutional:  Negative for appetite change, chills, fatigue, fever and unexpected weight change.  HENT:   Negative for hearing loss, lump/mass and trouble swallowing.   Eyes:  Negative for eye problems and icterus.  Respiratory:  Negative for chest tightness, cough and shortness of breath.   Cardiovascular:  Negative for chest pain, leg swelling and palpitations.  Gastrointestinal:  Negative for abdominal distention, abdominal pain, constipation, diarrhea, nausea and vomiting.  Endocrine: Negative for hot flashes.  Genitourinary:  Negative for difficulty urinating.   Musculoskeletal:  Positive for arthralgias.  Skin:  Negative for itching and rash.  Neurological:  Negative for dizziness, extremity weakness, headaches and numbness.  Hematological:  Negative for adenopathy. Does not bruise/bleed easily.  Psychiatric/Behavioral:  Negative for depression. The patient is nervous/anxious.       COVID 19 VACCINATION STATUS: fully vaccinated (Moderna); infection 04/08/2020; booster late September 2021   BREAST CANCER HISTORY:   From the earlier summary:  [name] is 70 years old [name] female.  This woman has been in good health all of her life.  She noted a swelling and discomfort in her right breast in June 2004.  She was seen in the Emergency Room in [place] and was treated for mastitis.  She  was treated for a number of months with mastitis and the swelling did not get better. She was given hydrocodone and Cipro.  Finally, the swelling did get  better and ultimately the nipple became retracted and she noticed some dimpling in her skin. She had a mammogram in July of 2004 in [place] with subsequent mammogram on May 27, 2003, by Dr. Isaiah Blakes.  Mammogram done on September 30 showed marked increased density in the left breast.  Biopsy was performed the same day.  It was noted at the 12 o'clock position, deep in the breast was a focal hypoechoic mass, at least 3.5 cm in diameter.  Biopsy did in fact show invasive in situ mammary carcinoma. This was felt to be both at least intermediate, high grade.  No definite lymphovascular invasion was identified. ER and PR negative, Her2 testing positive. Keyle continues to have pain in her breast.  She continues to take hydrocodone a number of times a day.  She has been seen by Dr. Rosana Hoes, who felt that neoadjuvant chemotherapy would be required.    Initial staging studies showed evidence of liver and lung mets.   Patient also has evidence of bone lesions. Patient started neoadjuvant chemotherapy, Taxotere/Carbo/Herceptin in October 2004.   Patient had a CT scan in December 2004 which demonstrated extensive clot in the SVC innominate vein, bilateral jugular vein and  She was started on anticoagulation therapy. She received a total of 6 cycles of Taxotere/Carbo/Herceptin, completed in April 2005."  Patient has been on  trastuzumab continued indefinitely; has also received lapatinib and capecitabine for variable intervals in 2007-2008. Most recent echo 12/01/2013 showed an ejection fraction of 55%. She is status post bilateral mastectomies with bilateral axillary lymph node dissection 12/07/2004, showing (a) on the right, a mypT1c ypN1 invasive ductal carcinoma, grade 3, estrogen and progesterone receptor negative, HER-2 positive, with an MIB-1 of 31% (b) on the left, ypT2 ypN1 invasive ductal carcinoma, grade 2, estrogen and progesterone receptor negative, HER-2 positive, with an MIB-1 of 35%. She is Status post  radiation June through July of 2006, to the right chest wall, left chest wall, bilateral supraclavicular fossae, and bilateral axillary boosts; with additional radiation to the right and left chest walls and the central chest wall completed November of 2007. She is status post ixempra x9 completed August of 2009. 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 Patient has chronic pain, with negative PET scan 08/24/2013 (no evidence of active cancer). On Neurontin and Tramadol therapy.  Her subsequent history is as detailed below   PAST MEDICAL HISTORY: Past Medical History:  Diagnosis Date   Breast cancer ([place])    mets to liver and lung   Breast cancer metastasized to multiple sites [facility]) 02/26/2013   CHF (congestive heart failure) ([place])    History of chemotherapy Feb. 2006   taxotere/herceptin/carboplatin   Hypertension    Neuropathy    Radiation 07/31/2006   left upper chest   Radiation 06/17/2006-06/27/2006   6480 cGy bilat. chest wall   SVC syndrome    Thrombosis     PAST SURGICAL HISTORY: Past Surgical History:  Procedure Laterality Date   ANKLE SURGERY     BACK SURGERY     CHOLECYSTECTOMY  1989   MASTECTOMY Bilateral    PERIPHERALLY INSERTED CENTRAL CATHETER INSERTION     TUBAL LIGATION  1986    FAMILY HISTORY Family History  Problem Relation Age of Onset   Heart  failure Father    Cancer Father        Prostate cancer   Heart failure Brother    Cancer Brother        Prostate cancer   Diabetes Maternal Aunt   She had three brothers, one died of gunshot wound, one of complications of diabetes mellitus and one of myocardial infarction.  She has no sisters.  Mother died of complications of brain metastasis in 40.  Father has had a myocardial infarction in 1999.  No history of breast or ovarian cancer in the family.     GYNECOLOGIC HISTORY:    No LMP recorded. Patient is  postmenopausal. Menarche at age 64.   Gravida 3, para 3.   First live birth at age 67.   No history of breast feeding. No history of hormonal replacement therapy.    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 He used to work as a Art gallery manager, but is now retired and disabled. The patient has three children, Monette who lives in [place] and works as a Hydrographic surveyor, Financial risk analyst who lives in [place] and works as a Administrator, and [place] who lives in [place] and also works as a Hydrographic surveyor. The patient has 14 grandchildren and 4 great-grandchildren. She attends a [employer].   ADVANCED DIRECTIVES: In the absence of any documentation to the contrary, the patient's spouse is their HCPOA.    HEALTH MAINTENANCE:  Social History   Tobacco Use   Smoking status: Never   Smokeless tobacco: Never  Substance Use Topics   Alcohol use: Yes    Comment: occasional   Drug use: No     Colonoscopy: Never and "I don't want one"  PAP:  1987  Bone density:  Never   Lipid panel:  Not on file   Allergies  Allergen Reactions   Penicillins Hives    PATIENT HAS TOLERATED CEPHALOSPORINGS   Adhesive [Tape] Other (See Comments)    Tears skin     Current Outpatient Medications  Medication Sig Dispense Refill   acetaminophen (TYLENOL) 500 MG tablet Take 1,000 mg by mouth every 6 (six) hours as needed for mild pain or fever.      albuterol (PROVENTIL HFA;VENTOLIN HFA) 108 (90 BASE) MCG/ACT inhaler Inhale 2 puffs into the lungs every 6 (six) hours as needed for wheezing. 1 Inhaler 5   ALPRAZolam (XANAX) 1 MG tablet TAKE 1 TABLET BY MOUTH THREE TIMES DAILY 60 tablet 0   amLODipine (NORVASC) 10 MG tablet Take 1 tablet (10 mg total) by mouth every morning. 30 tablet 4   baclofen (LIORESAL) 10 MG tablet TAKE 1 TABLET BY MOUTH THREE TIMES DAILY AS  NEEDED FOR MUSCLE SPASMS 270 tablet 0   carvedilol (COREG) 12.5 MG tablet TAKE 1 TABLET(12.5 MG) BY MOUTH TWICE DAILY 60 tablet 11   diclofenac sodium (VOLTAREN) 1 % GEL Apply 2 g topically daily as needed (for pain). Apply to knees and shoulders 100 g 6   furosemide (LASIX) 40 MG tablet TAKE 1 TABLET(40 MG) BY MOUTH DAILY 30 tablet 3   gabapentin (NEURONTIN) 300 MG capsule TAKE 2 CAPSULES(600 MG) BY MOUTH THREE TIMES DAILY 540 capsule 0   losartan (COZAAR) 100 MG tablet Take 100 mg by mouth every morning.     oxyCODONE-acetaminophen (PERCOCET) 10-325 MG tablet Take 1 tablet by mouth 4 (four)  times daily as needed.     potassium chloride (K-DUR,KLOR-CON) 20 MEQ tablet Take 1 tablet (20 mEq total) by mouth daily. 30 tablet 3   predniSONE (DELTASONE) 5 MG tablet      spironolactone (ALDACTONE) 25 MG tablet TAKE 1 TABLET(25 MG) BY MOUTH DAILY 30 tablet 11   temazepam (RESTORIL) 30 MG capsule TAKE ONE CAPSULE BY MOUTH EVERY NIGHT AT BEDTIME AS NEEDED FOR SLEEP 30 capsule 0   warfarin (COUMADIN) 5 MG tablet TAKE 1 TABLET BY MOUTH ON ODD NUMBERED DAYS PLUS 1 AND 1/2 TABLETS ON EVEN NUMBERED DAYS 90 tablet 4   No current facility-administered medications for this visit.   Facility-Administered Medications Ordered in Other Visits  Medication Dose Route Frequency Provider Last Rate Last Admin   sodium chloride flush (NS) 0.9 % injection 10 mL  10 mL Intravenous PRN Magrinat, Virgie Dad, MD   10 mL at 12/15/15 1200   sodium chloride flush (NS) 0.9 % injection 10 mL  10 mL Intracatheter PRN Magrinat, Virgie Dad, MD   10 mL at 08/14/18 1116   sodium chloride flush (NS) 0.9 % injection 10 mL  10 mL Intracatheter PRN Magrinat, Virgie Dad, MD        OBJECTIVE: African-American woman in no acute distress  Vitals:   04/27/21 0830  BP: (!) 164/74  Pulse: (!) 55  Resp: 18  Temp: 97.7 F (36.5 C)  SpO2: 100%     Body mass index is 48.93 kg/m.    ECOG FS: 2 Filed Weights   04/27/21 0830  Weight: 276 lb 3.2  oz (125.3 kg)   GENERAL: Patient is a well appearing female in no acute distress, examined in chair HEENT:  Sclerae anicteric. Mask in place. Neck is supple.  NODES:  No cervical, supraclavicular, or axillary lymphadenopathy palpated.  BREAST EXAM: deferred today LUNGS:  Clear to auscultation bilaterally.  No wheezes or rhonchi. HEART:  Regular rate and rhythm. No murmur appreciated. ABDOMEN:  Soft, nontender.  Positive, normoactive bowel sounds. No organomegaly palpated. MSK:  No focal spinal tenderness to palpation. EXTREMITIES:  No peripheral edema.   SKIN:  Clear with no obvious rashes or skin changes. No nail dyscrasia. NEURO:  Nonfocal. Well oriented.  Appropriate affect.     LAB RESULTS: Lab Results  Component Value Date   WBC 9.3 04/27/2021   NEUTROABS 6.2 04/27/2021   HGB 11.1 (L) 04/27/2021   HCT 32.2 (L) 04/27/2021   MCV 83.0 04/27/2021   PLT 234 04/27/2021      Chemistry      Component Value Date/Time   NA 141 03/30/2021 0835   NA 143 08/14/2017 0811   K 4.6 03/30/2021 0835   K 3.8 08/14/2017 0811   CL 107 03/30/2021 0835   CL 104 01/30/2013 0850   CO2 25 03/30/2021 0835   CO2 24 08/14/2017 0811   BUN 30 (H) 03/30/2021 0835   BUN 14.9 08/14/2017 0811   CREATININE 1.02 (H) 03/30/2021 0835   CREATININE 0.87 05/18/2020 0846   CREATININE 0.9 08/14/2017 0811      Component Value Date/Time   CALCIUM 9.6 03/30/2021 0835   CALCIUM 9.4 08/14/2017 0811   ALKPHOS 68 03/30/2021 0835   ALKPHOS 73 08/14/2017 0811   AST 10 (L) 03/30/2021 0835   AST 10 (L) 05/18/2020 0846   AST 10 08/14/2017 0811   ALT 10 03/30/2021 0835   ALT 9 05/18/2020 0846   ALT 10 08/14/2017 0811   BILITOT 1.0 03/30/2021 0835  BILITOT 0.6 05/18/2020 0846   BILITOT 0.62 08/14/2017 0811      STUDIES: No results found.   ASSESSMENT: 70 y.o.  [place], [place], woman  (1)  with a history of inflammatory right breast cancer metastatic at presentation September 2004 with  involvement of liver and bone, HER-2 positive, estrogen and progesterone receptor negative  (2) treated with carboplatin, docetaxel and Herceptin x 6 completed April 2005  (3) trastuzumab continued indefinitely;   (a) has also received lapatinib and capecitabine for variable intervals in 2007-2008.  (b) Every 6 month echo: 09/24/2017 showed an ejection fraction in the 55-60%  (c) echocardiogram 03/07/2018 showed an ejection fraction in the 55-60%  (d) cardiogram 09/30/2018 showed ejection fraction in the 60-65% range.  (e) echocardiogram November 30, 2019 showed an ejection fraction in the 55-60% range  (f) echo 08/31/2020  (4) status post bilateral mastectomies with bilateral axillary lymph node dissection 12/07/2004, showing  (a) on the right, a mypT1c ypN1 invasive ductal carcinoma, grade 3, estrogen and progesterone receptor negative, HER-2 positive, with an MIB-1 of 31%  (b) on the left, ypT2 ypN1 invasive ductal carcinoma, grade 2, estrogen and progesterone receptor negative, HER-2 positive, with an MIB-1 of 35%.  (5)  Status post radiation June through July of 2006, to the right chest wall, left chest wall, bilateral supraclavicular fossae, and bilateral axillary boosts; with additional radiation to the right and left chest walls and the central chest wall completed November of 2007  (6) status post Ixempra x9 completed August of 2009.  (7) history of superior vena caval syndrome, on life long anticoagulation   (8)  History of chemotherapy-induced neuropathy.   (9)  chronic pain, with negative PET scan 08/24/2013 (no evidence of active cancer). On Neurontin and Tramadol  (a) repeat PET scan December 2016 again negative.  (b) Repeat PET scan 04/23/2017 shows no active malignancy  (c) PET scan 07/09/2018 shows no evidence of active malignancy.  (d) PET scan 12/26/2018 shows no evidence of active disease  (e) chest CT 08/05/2020 shows no evidence of progressive disease  (10) right  upper extremity cellulitis, no bacteremia; treated with cephalexin /doxycycline for 2 weeks, with resolution  (11) subacute fracture right humerus, nondisplaced, status post fall 03/27/2017   PLAN: Kira is here today for f/u prior to receive Trastuzumab for her metastatic breast cancer.  We reviewed her scans in detail.  She has no sign of progression of her cancer.  This is great news.  She will undergo a repeat echocardiogram in 2 weeks, and she will proceed with treatment today as we are getting these echocardiograms every 6 months.   She has no signs of cardiac issues today.    We discussed her stress level and that they could certainly be the cause of her headaches.  She does not prefer to have one of our free massages from support services at this time.  She will continue to f/u with her PCP about this and her arthritis.   Starlena and I talked about advanced directives and she plans on completing these.  She and I reviewed her overall treatment plan.  I refilled her Gabapentin and Tempazepam.  She is tolerating these well.    Sontee will continue on Trastuzumab every 4 weeks.  We will see her back in 12 weeks.  She knows to call for any questions that may arise between now and her next appointment.  We are happy to see her sooner if needed.   Total encounter time  30 minutes.* in face to face visit time, chart review, treatment review, collaboration and coordination with nursing staff, and documentation of the encounter.  Wilber Bihari, NP 04/27/21 8:39 AM Medical Oncology and Hematology [facility] [place], [place] [address] Tel. [phone]    Fax. [phone]    *Total Encounter Time as defined by the Centers for Medicare and Medicaid Services includes, in addition to the face-to-face time of a patient visit (documented in the note above) non-face-to-face time: obtaining and reviewing outside history, ordering and reviewing medications, tests or  procedures, care coordination (communications with other health care professionals or caregivers) and documentation in the medical record.

## 2021-04-27 NOTE — Patient Instructions (Signed)
[facility] MEDICAL ONCOLOGY   Discharge Instructions: Thank you for choosing [facility] to provide your oncology and hematology care.   If you have a lab appointment with the Cancer Center, please go directly to the Cancer Center and check in at the registration area.   Wear comfortable clothing and clothing appropriate for easy access to any Portacath or PICC line.   We strive to give you quality time with your provider. You may need to reschedule your appointment if you arrive late (15 or more minutes).  Arriving late affects you and other patients whose appointments are after yours.  Also, if you miss three or more appointments without notifying the office, you may be dismissed from the clinic at the provider's discretion.      For prescription refill requests, have your pharmacy contact our office and allow 72 hours for refills to be completed.    Today you received the following chemotherapy and/or immunotherapy agents: [id number].      To help prevent nausea and vomiting after your treatment, we encourage you to take your nausea medication as directed.  BELOW ARE SYMPTOMS THAT SHOULD BE REPORTED IMMEDIATELY: *FEVER GREATER THAN 100.4 F (38 C) OR HIGHER *CHILLS OR SWEATING *NAUSEA AND VOMITING THAT IS NOT CONTROLLED WITH YOUR NAUSEA MEDICATION *UNUSUAL SHORTNESS OF BREATH *UNUSUAL BRUISING OR BLEEDING *URINARY PROBLEMS (pain or burning when urinating, or frequent urination) *BOWEL PROBLEMS (unusual diarrhea, constipation, pain near the anus) TENDERNESS IN MOUTH AND THROAT WITH OR WITHOUT PRESENCE OF ULCERS (sore throat, sores in mouth, or a toothache) UNUSUAL RASH, SWELLING OR PAIN  UNUSUAL VAGINAL DISCHARGE OR ITCHING   Items with * indicate a potential emergency and should be followed up as soon as possible or go to the Emergency Department if any problems should occur.  Please show the CHEMOTHERAPY ALERT CARD or IMMUNOTHERAPY ALERT CARD at  check-in to the Emergency Department and triage nurse.  Should you have questions after your visit or need to cancel or reschedule your appointment, please contact [facility] MEDICAL ONCOLOG[facility]  Dept: [phone]  and follow the prompts.  Office hours are 8:00 a.m. to 4:30 p.m. Monday - Friday. Please note that voicemails left after 4:00 p.m. may not be returned until the following business day.  We are closed weekends and major holidays. You have access to a nurse at all times for urgent questions. Please call the main number to the clinic Dept: [phone] and follow the prompts.   For any non-urgent questions, you may also contact your provider using MyChart. We now offer e-Visits for anyone 18 and older to request care online for non-urgent symptoms. For details visit [email or web address].   Also download the MyChart app! Go to the app store, search "MyChart", open the app, select [facility], and log in with your MyChart username and password.  Due to Covid, a mask is required upon entering the hospital/clinic. If you do not have a mask, one will be given to you upon arrival. For doctor visits, patients may have 1 support person aged 18 or older with them. For treatment visits, patients cannot have anyone with them due to current Covid guidelines and our immunocompromised population.    

## 2021-04-27 NOTE — Progress Notes (Signed)
Per Wilber Bihari, NP okay to treat with ECHO from January 2022.

## 2021-04-28 ENCOUNTER — Telehealth: Payer: Self-pay | Admitting: Oncology

## 2021-04-28 ENCOUNTER — Other Ambulatory Visit: Payer: Self-pay

## 2021-04-28 DIAGNOSIS — C50919 Malignant neoplasm of unspecified site of unspecified female breast: Secondary | ICD-10-CM

## 2021-04-28 MED ORDER — TEMAZEPAM 30 MG PO CAPS: ORAL_CAPSULE | ORAL | 0 refills | Status: DC

## 2021-04-28 NOTE — Telephone Encounter (Signed)
Scheduled per 9/1 los. Called and spoke with pt confirmed added appts

## 2021-04-28 NOTE — Telephone Encounter (Signed)
Pt called to notify that pharmacy did not receive Rx for Temazepam '30mg'$ .   RN called pharmacy, voicemail left with Rx for Temazepam '30mg'$  TAKE ONE CAPSULE BY MOUTH EVERY NIGHT AT BEDTIME AS NEEDED FOR SLEEP.    Pt notified, verbalized appreciation.

## 2021-05-03 ENCOUNTER — Telehealth: Payer: Self-pay | Admitting: *Deleted

## 2021-05-03 NOTE — Telephone Encounter (Signed)
This RN spoke with pt per MD review of INR of 1.2 due to need to verify compliance with coumadin dose. [name] states she is currently on 5 mg alt with 7.5 mg but verifies she has missed several doses due to " just a lot going on with deaths in the family and having to travel to funerals ".  This RN validated her current emotions with losses of family with verbal support.  This RN also discussed need to remember to take the coumadin due to issues of history of blood clots.  [name] verbalized she will remember by writing it on her calender.  Per end of discussion no further needs.

## 2021-05-10 ENCOUNTER — Ambulatory Visit (HOSPITAL_COMMUNITY)
Admission: RE | Admit: 2021-05-10 | Discharge: 2021-05-10 | Disposition: A | Discharge status: Home / Self Care | Payer: Medicare Other | Source: Ambulatory Visit | Attending: Internal Medicine | Admitting: Internal Medicine

## 2021-05-10 ENCOUNTER — Ambulatory Visit (HOSPITAL_BASED_OUTPATIENT_CLINIC_OR_DEPARTMENT_OTHER)
Admission: RE | Admit: 2021-05-10 | Discharge: 2021-05-10 | Disposition: A | Discharge status: Home / Self Care | Payer: Medicare Other | Source: Ambulatory Visit | Attending: Cardiology | Admitting: Cardiology

## 2021-05-10 ENCOUNTER — Encounter (HOSPITAL_COMMUNITY): Payer: Self-pay | Admitting: Cardiology

## 2021-05-10 ENCOUNTER — Other Ambulatory Visit: Payer: Self-pay

## 2021-05-10 VITALS — BP 110/70 | HR 73 | Wt 274.2 lb

## 2021-05-10 DIAGNOSIS — Z171 Estrogen receptor negative status [ER-]: Secondary | ICD-10-CM

## 2021-05-10 DIAGNOSIS — Z79899 Other long term (current) drug therapy: Secondary | ICD-10-CM | POA: Diagnosis not present

## 2021-05-10 DIAGNOSIS — Z7901 Long term (current) use of anticoagulants: Secondary | ICD-10-CM | POA: Insufficient documentation

## 2021-05-10 DIAGNOSIS — C50012 Malignant neoplasm of nipple and areola, left female breast: Secondary | ICD-10-CM

## 2021-05-10 DIAGNOSIS — Z7952 Long term (current) use of systemic steroids: Secondary | ICD-10-CM | POA: Insufficient documentation

## 2021-05-10 DIAGNOSIS — I5022 Chronic systolic (congestive) heart failure: Secondary | ICD-10-CM | POA: Diagnosis not present

## 2021-05-10 DIAGNOSIS — C50011 Malignant neoplasm of nipple and areola, right female breast: Secondary | ICD-10-CM

## 2021-05-10 DIAGNOSIS — Z01818 Encounter for other preprocedural examination: Secondary | ICD-10-CM | POA: Insufficient documentation

## 2021-05-10 DIAGNOSIS — I5032 Chronic diastolic (congestive) heart failure: Secondary | ICD-10-CM

## 2021-05-10 DIAGNOSIS — I110 Hypertensive heart disease with heart failure: Secondary | ICD-10-CM | POA: Diagnosis not present

## 2021-05-10 DIAGNOSIS — I871 Compression of vein: Secondary | ICD-10-CM | POA: Diagnosis not present

## 2021-05-10 DIAGNOSIS — Z0189 Encounter for other specified special examinations: Secondary | ICD-10-CM | POA: Diagnosis not present

## 2021-05-10 LAB — ECHOCARDIOGRAM COMPLETE
Area-P 1/2: 3.77 cm2
S' Lateral: 3 cm

## 2021-05-10 MED ORDER — POTASSIUM CHLORIDE CRYS ER 20 MEQ PO TBCR: 20.0000 meq | EXTENDED_RELEASE_TABLET | Freq: Every day | ORAL | 3 refills | Status: DC | PRN

## 2021-05-10 NOTE — Progress Notes (Signed)
Advanced Heart Failure Clinic Note   Patient ID: [name], female   DOB: 1951/07/24, 70 y.o.   MRN: [id number] Oncologist: Dr Jana Hakim  HPI: [name] is a 70 y.o. with a history of metastatic HER-2 positive breast carcinoma originally diagnosed September 2004. Started in R breast. Underwent neo-adjuvant chemo. During this time developed L breast CA. Underwent bilateral mastectomy. Lymph nodes +. 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   She received a total of 6 cycles of Taxotere/Carbo/Herceptin, completed in April 2005, after which she began single agent Herceptin, given every 4 weeks now. Plan to continue on Herceptin indefinitely.   Coronary CTA in 8/20 showed no significant CAD, calcium score 0.   She returns for cardio-oncology followup.  She continues to be stable symptomatically.  Weight down another 7 lbs.  She is taking Lasix about twice a week.  No dyspnea except with heavy exertion (heavy yardwork).  No chest pain.      Labs 11/15: K 4, creatinine 1.0   Labs 1/16: K 4.1, creatinine 1.0 Labs 8/16: K 4.3, creatinine 0.9 Labs 11/16: K 3.9, creatinine 0.9 Labs 5/17: K 3.9, creatinine 0.68 Labs 5/18: K 4, creatinine 0.65 Labs 1/19: K 4.1, creatinine 1.23 Labs 7/19: K 3.9, creatinine 1.11 Labs 1/20: K 3.8, creatinine 0.83 Labs 7/20: K 4.1, creatinine 1.18, LDL 112 Labs 3/21: K 4.2, creatinine 0.86 Labs 4/21: LDL 115, HDL 105 Labs 3/22: K 4.5, creatinine 0.86 Labs 9/22: K 4.3, creatinine 0.98, hgb 11.1  04/23/12 ECHO EF 60-65% Lateral s' 8.9 cm/s  07/30/12 ECHO EF 60-65% Lateral s' 8.3 cm/s  09/11/12 ECHO EF 60-65% Lateral s' 10.3 cm/s  12/22/12 ECHO 55-60% Lateral S' 9.8 RV mildly dilated  07/01/13 ECHO 55-60%, lateral s' 9.79, mild RV dilation, grade II DD 3/15 ECHO 55%, mild MR, lateral s' 9.6, GLS -19.2% 03/02/14 ECHO EF 55-60% Lateral S' 9.4 GLS - 21.9  11/15 ECHO EF 60-65%, lateral S' 6.8, GLS -17.2%, mild RV dilation  with normal systolic function, mild MR.  2/16 ECHO EF 60-65%, lateral S' 14.4, GLS -30.0%, normal diastolic function, mild RV dilation with normal RV systolic function.  8/16 ECHO EF 60-65%, mild LVH, normal RV size and systolic function, lateral s' 13.2, GLS -17%.  11/16 ECHO EF 60-65% Lateral S' 10.2, GLS -20.2% 5/17 ECHO EF 60-65%, mild LVH, lateral s' 12.2, GLS -20.8%, grade II diastolic dysfunction, normal RV. 9/17 ECHO EF 76-22%, normal diastolic function, GLS -63.3% 12/17 ECHO EF 55-60%, moderate diastolic dysfunction, GLS -20.1%, normal RV size and systolic function 3/54 ECHO EF 55-60%, GLS -56.2%, normal diastolic function, normal RV size and systolic function.  1/19 ECHO EF 55-60%, GLS -56.3%, normal diastolic function, RV normal size and systolic function.  7/19 ECHO EF 60-65%, GLS -22.1%.  1/20 ECHO EF 89-37%, normal diastolic function, normal RV, strain not done.  6/20 ECHO EF 34-28%, normal diastolic function, normal RV, GLS -24.4% 4/21 ECHO EF 76-81%, normal diastolic function, normal RV, strain tracking was not accurate.  1/22 ECHO EF 60-65%, normal RV, moderate LAE, IVC normal.  9/22 ECHO EF 60-65%, normal RV   ROS: All systems negative except as listed in HPI, PMH and Problem List.  Past Medical History:  Diagnosis Date   Breast cancer ([place])    mets to liver and lung   Breast cancer metastasized to multiple sites [facility]) 02/26/2013   CHF (congestive heart failure) ([place])    History of chemotherapy Feb. 2006   taxotere/herceptin/carboplatin  Hypertension    Neuropathy    Radiation 07/31/2006   left upper chest   Radiation 06/17/2006-06/27/2006   6480 cGy bilat. chest wall   SVC syndrome    Thrombosis     Current Outpatient Medications  Medication Sig Dispense Refill   acetaminophen (TYLENOL) 500 MG tablet Take 1,000 mg by mouth every 6 (six) hours as needed for mild pain or fever.      albuterol (PROVENTIL HFA;VENTOLIN HFA) 108 (90 BASE) MCG/ACT inhaler Inhale 2  puffs into the lungs every 6 (six) hours as needed for wheezing. 1 Inhaler 5   ALPRAZolam (XANAX) 1 MG tablet Take 1 mg by mouth as needed for anxiety.     amLODipine (NORVASC) 10 MG tablet Take 1 tablet (10 mg total) by mouth every morning. 30 tablet 4   baclofen (LIORESAL) 10 MG tablet TAKE 1 TABLET BY MOUTH THREE TIMES DAILY AS NEEDED FOR MUSCLE SPASMS 270 tablet 0   carvedilol (COREG) 12.5 MG tablet TAKE 1 TABLET(12.5 MG) BY MOUTH TWICE DAILY 60 tablet 11   diclofenac sodium (VOLTAREN) 1 % GEL Apply 2 g topically daily as needed (for pain). Apply to knees and shoulders 100 g 6   furosemide (LASIX) 40 MG tablet TAKE 1 TABLET(40 MG) BY MOUTH DAILY 30 tablet 3   gabapentin (NEURONTIN) 300 MG capsule Two capsules by mouth TID 540 capsule 0   losartan (COZAAR) 100 MG tablet Take 100 mg by mouth every morning.     oxyCODONE-acetaminophen (PERCOCET) 10-325 MG tablet Take 1 tablet by mouth 4 (four) times daily as needed.     predniSONE (DELTASONE) 5 MG tablet Take 5 mg by mouth daily in the afternoon.     spironolactone (ALDACTONE) 25 MG tablet TAKE 1 TABLET(25 MG) BY MOUTH DAILY 30 tablet 11   temazepam (RESTORIL) 30 MG capsule TAKE ONE CAPSULE BY MOUTH EVERY NIGHT AT BEDTIME AS NEEDED FOR SLEEP 30 capsule 0   warfarin (COUMADIN) 5 MG tablet TAKE 1 TABLET BY MOUTH ON ODD NUMBERED DAYS PLUS 1 AND 1/2 TABLETS ON EVEN NUMBERED DAYS 90 tablet 4   potassium chloride SA (KLOR-CON) 20 MEQ tablet Take 1 tablet (20 mEq total) by mouth daily as needed. With use of lasix 30 tablet 3   No current facility-administered medications for this encounter.   Facility-Administered Medications Ordered in Other Encounters  Medication Dose Route Frequency Provider Last Rate Last Admin   sodium chloride flush (NS) 0.9 % injection 10 mL  10 mL Intravenous PRN Magrinat, Virgie Dad, MD   10 mL at 12/15/15 1200   sodium chloride flush (NS) 0.9 % injection 10 mL  10 mL Intracatheter PRN Magrinat, Virgie Dad, MD   10 mL at 08/14/18  1116   sodium chloride flush (NS) 0.9 % injection 10 mL  10 mL Intracatheter PRN Magrinat, Virgie Dad, MD        Vitals:   05/10/21 1057  BP: 110/70  Pulse: 73  SpO2: 94%  Weight: 124.4 kg (274 lb 3.2 oz)    PHYSICAL EXAM: General: NAD Neck: No JVD, no thyromegaly or thyroid nodule.  Lungs: Clear to auscultation bilaterally with normal respiratory effort. CV: Nondisplaced PMI.  Heart regular S1/S2, no S3/S4, no murmur.  Trace ankle edema.  No carotid bruit.  Normal pedal pulses.  Abdomen: Soft, nontender, no hepatosplenomegaly, no distention.  Skin: Intact without lesions or rashes.  Neurologic: Alert and oriented x 3.  Psych: Normal affect. Extremities: No clubbing or cyanosis.  HEENT: Normal.  ASSESSMENT & PLAN: 1) L Breast Cancer s/p bilateral mastectomies:  Symptomatically stable. Today's echo was reviewed, EF normal. She will be continuing Herceptin indefinitely.   - I think we can push echo screening out to 9 months at this point.  Repeat echo in 9 months.    2) Suspected OSA: She has not wanted to do a sleep study.    3) HTN: BP controlled on current regimen.     4) Chronic diastolic CHF: Looks euvolemic on exam, NYHA class II symptoms.    - Can continue to take Lasix twice a week with KCl 20 (refill KCl).  5) SVC syndrome: Continue warfarin. 6) Coronary disease risk: She has a strong FH of coronary disease, as well as HTN.  Coronary CTA in 8/20 showed no significant coronary disease and calcium score 0.  - No ASA given warfarin use.   Followup 9 months with echo.   Loralie Champagne 05/10/2021

## 2021-05-10 NOTE — Patient Instructions (Signed)
New prescription sent for Potassium 20 meq (1 tab) daily AS NEEDED with use of Lasix  Your physician has requested that you have an echocardiogram. Echocardiography is a painless test that uses sound waves to create images of your heart. It provides your doctor with information about the size and shape of your heart and how well your heart's chambers and valves are working. This procedure takes approximately one hour. There are no restrictions for this procedure.  Your physician recommends that you schedule a follow-up appointment in: 9 months with an echo.  Our office will call you to schedule this appointment closer to that time.  Please call office at [phone] option 2 if you have any questions or concerns.   At the [facility], you and your health needs are our priority. As part of our continuing mission to provide you with exceptional heart care, we have created designated Provider Care Teams. These Care Teams include your primary Cardiologist (physician) and Advanced Practice Providers (APPs- Physician Assistants and Nurse Practitioners) who all work together to provide you with the care you need, when you need it.   You may see any of the following providers on your designated Care Team at your next follow up: Dr Glori Bickers Dr Loralie Champagne Dr Patrice Paradise, NP Lyda Jester, [place] Ginnie Smart Audry Riles, PharmD   Please be sure to bring in all your medications bottles to every appointment.

## 2021-05-10 NOTE — Progress Notes (Signed)
  Echocardiogram 2D Echocardiogram has been performed.  Johny Chess 05/10/2021, 10:47 AM

## 2021-05-11 DIAGNOSIS — F419 Anxiety disorder, unspecified: Secondary | ICD-10-CM | POA: Diagnosis not present

## 2021-05-11 DIAGNOSIS — Z6841 Body Mass Index (BMI) 40.0 and over, adult: Secondary | ICD-10-CM | POA: Diagnosis not present

## 2021-05-11 DIAGNOSIS — M170 Bilateral primary osteoarthritis of knee: Secondary | ICD-10-CM | POA: Diagnosis not present

## 2021-05-19 ENCOUNTER — Other Ambulatory Visit: Payer: Self-pay | Admitting: Oncology

## 2021-05-23 ENCOUNTER — Other Ambulatory Visit: Payer: Self-pay | Admitting: *Deleted

## 2021-05-23 DIAGNOSIS — C50919 Malignant neoplasm of unspecified site of unspecified female breast: Secondary | ICD-10-CM

## 2021-05-23 DIAGNOSIS — D689 Coagulation defect, unspecified: Secondary | ICD-10-CM

## 2021-05-25 ENCOUNTER — Other Ambulatory Visit: Payer: Medicare Other

## 2021-05-25 ENCOUNTER — Other Ambulatory Visit: Payer: Self-pay

## 2021-05-25 ENCOUNTER — Inpatient Hospital Stay: Payer: Medicare Other

## 2021-05-25 ENCOUNTER — Other Ambulatory Visit: Payer: Self-pay | Admitting: Oncology

## 2021-05-25 VITALS — BP 138/67 | HR 69 | Temp 98.7°F | Resp 18 | Wt 277.0 lb

## 2021-05-25 DIAGNOSIS — Z95828 Presence of other vascular implants and grafts: Secondary | ICD-10-CM

## 2021-05-25 DIAGNOSIS — Z171 Estrogen receptor negative status [ER-]: Secondary | ICD-10-CM

## 2021-05-25 DIAGNOSIS — C50919 Malignant neoplasm of unspecified site of unspecified female breast: Secondary | ICD-10-CM

## 2021-05-25 DIAGNOSIS — D689 Coagulation defect, unspecified: Secondary | ICD-10-CM

## 2021-05-25 DIAGNOSIS — C50812 Malignant neoplasm of overlapping sites of left female breast: Secondary | ICD-10-CM | POA: Diagnosis not present

## 2021-05-25 DIAGNOSIS — C787 Secondary malignant neoplasm of liver and intrahepatic bile duct: Secondary | ICD-10-CM | POA: Diagnosis not present

## 2021-05-25 DIAGNOSIS — Z5112 Encounter for antineoplastic immunotherapy: Secondary | ICD-10-CM | POA: Diagnosis not present

## 2021-05-25 DIAGNOSIS — C7800 Secondary malignant neoplasm of unspecified lung: Secondary | ICD-10-CM | POA: Diagnosis not present

## 2021-05-25 DIAGNOSIS — C50011 Malignant neoplasm of nipple and areola, right female breast: Secondary | ICD-10-CM

## 2021-05-25 DIAGNOSIS — C50911 Malignant neoplasm of unspecified site of right female breast: Secondary | ICD-10-CM | POA: Diagnosis not present

## 2021-05-25 LAB — CBC WITH DIFFERENTIAL (CANCER CENTER ONLY)
Abs Immature Granulocytes: 0.02 10*3/uL (ref 0.00–0.07)
Basophils Absolute: 0 10*3/uL (ref 0.0–0.1)
Basophils Relative: 0 %
Eosinophils Absolute: 0 10*3/uL (ref 0.0–0.5)
Eosinophils Relative: 0 %
HCT: 34.2 % — ABNORMAL LOW (ref 36.0–46.0)
Hemoglobin: 11.5 g/dL — ABNORMAL LOW (ref 12.0–15.0)
Immature Granulocytes: 0 %
Lymphocytes Relative: 16 %
Lymphs Abs: 1.5 10*3/uL (ref 0.7–4.0)
MCH: 27.6 pg (ref 26.0–34.0)
MCHC: 33.6 g/dL (ref 30.0–36.0)
MCV: 82.2 fL (ref 80.0–100.0)
Monocytes Absolute: 0.2 10*3/uL (ref 0.1–1.0)
Monocytes Relative: 2 %
Neutro Abs: 7.6 10*3/uL (ref 1.7–7.7)
Neutrophils Relative %: 82 %
Platelet Count: 254 10*3/uL (ref 150–400)
RBC: 4.16 MIL/uL (ref 3.87–5.11)
RDW: 14.8 % (ref 11.5–15.5)
WBC Count: 9.4 10*3/uL (ref 4.0–10.5)
nRBC: 0 % (ref 0.0–0.2)

## 2021-05-25 LAB — CMP (CANCER CENTER ONLY)
ALT: 9 U/L (ref 0–44)
AST: 12 U/L — ABNORMAL LOW (ref 15–41)
Albumin: 3.5 g/dL (ref 3.5–5.0)
Alkaline Phosphatase: 74 U/L (ref 38–126)
Anion gap: 11 (ref 5–15)
BUN: 15 mg/dL (ref 8–23)
CO2: 24 mmol/L (ref 22–32)
Calcium: 9.2 mg/dL (ref 8.9–10.3)
Chloride: 106 mmol/L (ref 98–111)
Creatinine: 1.03 mg/dL — ABNORMAL HIGH (ref 0.44–1.00)
GFR, Estimated: 59 mL/min — ABNORMAL LOW (ref >60)
Glucose, Bld: 115 mg/dL — ABNORMAL HIGH (ref 70–99)
Potassium: 4.5 mmol/L (ref 3.5–5.1)
Sodium: 141 mmol/L (ref 135–145)
Total Bilirubin: 0.9 mg/dL (ref 0.3–1.2)
Total Protein: 7.3 g/dL (ref 6.5–8.1)

## 2021-05-25 LAB — PROTIME-INR
INR: 1.3 — ABNORMAL HIGH (ref 0.8–1.2)
Prothrombin Time: 16.2 seconds — ABNORMAL HIGH (ref 11.4–15.2)

## 2021-05-25 MED ORDER — HEPARIN SOD (PORK) LOCK FLUSH 100 UNIT/ML IV SOLN
500.0000 [IU] | Freq: Once | INTRAVENOUS | Status: AC | PRN
  Administered 2021-05-25: 500 [IU]

## 2021-05-25 MED ORDER — ACETAMINOPHEN 325 MG PO TABS
650.0000 mg | ORAL_TABLET | Freq: Once | ORAL | Status: AC
  Administered 2021-05-25: 650 mg via ORAL
  Filled 2021-05-25: qty 2

## 2021-05-25 MED ORDER — SODIUM CHLORIDE 0.9 % IV SOLN
Freq: Once | INTRAVENOUS | Status: AC
Start: 2021-05-25 — End: 2021-05-25

## 2021-05-25 MED ORDER — TRASTUZUMAB-DKST CHEMO 150 MG IV SOLR
750.0000 mg | Freq: Once | INTRAVENOUS | Status: AC
  Administered 2021-05-25: 750 mg via INTRAVENOUS
  Filled 2021-05-25: qty 35.72

## 2021-05-25 MED ORDER — WARFARIN SODIUM 5 MG PO TABS: ORAL_TABLET | ORAL | 4 refills | Status: DC

## 2021-05-25 MED ORDER — SODIUM CHLORIDE 0.9% FLUSH
10.0000 mL | INTRAVENOUS | Status: DC | PRN
  Administered 2021-05-25: 10 mL

## 2021-05-25 MED ORDER — LORAZEPAM 2 MG/ML IJ SOLN
1.0000 mg | Freq: Once | INTRAMUSCULAR | Status: AC | PRN
  Administered 2021-05-25: 1 mg via INTRAVENOUS
  Filled 2021-05-25: qty 1

## 2021-05-25 MED ORDER — DIPHENHYDRAMINE HCL 25 MG PO CAPS
25.0000 mg | ORAL_CAPSULE | Freq: Once | ORAL | Status: AC
  Administered 2021-05-25: 25 mg via ORAL
  Filled 2021-05-25: qty 1

## 2021-05-25 NOTE — Progress Notes (Signed)
I called [facility] to find out why her INR continue to be subtherapeutic.  She tells me 2 of her relatives, the last relatives she has she said (aside from her children) died both from cancer, one of them has 2 children and she is currently in [place] trying to straighten all that out.  She has not been taking her medication.  I offered to refill it in [place] but she tells me she will pick it up in [place] when she gets back there.

## 2021-05-25 NOTE — Patient Instructions (Signed)
[facility]   Discharge Instructions: Thank you for choosing [facility] to provide your oncology and hematology care.   If you have a lab appointment with the Cancer Center, please go directly to the Cancer Center and check in at the registration area.   Wear comfortable clothing and clothing appropriate for easy access to any Portacath or PICC line.   We strive to give you quality time with your provider. You may need to reschedule your appointment if you arrive late (15 or more minutes).  Arriving late affects you and other patients whose appointments are after yours.  Also, if you miss three or more appointments without notifying the office, you may be dismissed from the clinic at the provider's discretion.      For prescription refill requests, have your pharmacy contact our office and allow 72 hours for refills to be completed.    Today you received the following chemotherapy and/or immunotherapy agents: trastuzumab-dkst.      To help prevent nausea and vomiting after your treatment, we encourage you to take your nausea medication as directed.  BELOW ARE SYMPTOMS THAT SHOULD BE REPORTED IMMEDIATELY: *FEVER GREATER THAN 100.4 F (38 C) OR HIGHER *CHILLS OR SWEATING *NAUSEA AND VOMITING THAT IS NOT CONTROLLED WITH YOUR NAUSEA MEDICATION *UNUSUAL SHORTNESS OF BREATH *UNUSUAL BRUISING OR BLEEDING *URINARY PROBLEMS (pain or burning when urinating, or frequent urination) *BOWEL PROBLEMS (unusual diarrhea, constipation, pain near the anus) TENDERNESS IN MOUTH AND THROAT WITH OR WITHOUT PRESENCE OF ULCERS (sore throat, sores in mouth, or a toothache) UNUSUAL RASH, SWELLING OR PAIN  UNUSUAL VAGINAL DISCHARGE OR ITCHING   Items with * indicate a potential emergency and should be followed up as soon as possible or go to the Emergency Department if any problems should occur.  Please show the CHEMOTHERAPY ALERT CARD or IMMUNOTHERAPY ALERT CARD at  check-in to the Emergency Department and triage nurse.  Should you have questions after your visit or need to cancel or reschedule your appointment, please contact [facility]  Dept: [phone]  and follow the prompts.  Office hours are 8:00 a.m. to 4:30 p.m. Monday - Friday. Please note that voicemails left after 4:00 p.m. may not be returned until the following business day.  We are closed weekends and major holidays. You have access to a nurse at all times for urgent questions. Please call the main number to the clinic Dept: [phone] and follow the prompts.   For any non-urgent questions, you may also contact your provider using MyChart. We now offer e-Visits for anyone 18 and older to request care online for non-urgent symptoms. For details visit [email or web address].   Also download the MyChart app! Go to the app store, search "MyChart", open the app, select [facility], and log in with your MyChart username and password.  Due to Covid, a mask is required upon entering the hospital/clinic. If you do not have a mask, one will be given to you upon arrival. For doctor visits, patients may have 1 support person aged 18 or older with them. For treatment visits, patients cannot have anyone with them due to current Covid guidelines and our immunocompromised population.    

## 2021-05-27 ENCOUNTER — Other Ambulatory Visit: Payer: Self-pay | Admitting: Oncology

## 2021-05-30 ENCOUNTER — Other Ambulatory Visit: Payer: Self-pay | Admitting: *Deleted

## 2021-05-30 DIAGNOSIS — C50919 Malignant neoplasm of unspecified site of unspecified female breast: Secondary | ICD-10-CM

## 2021-05-30 MED ORDER — TEMAZEPAM 30 MG PO CAPS: ORAL_CAPSULE | ORAL | 1 refills | Status: DC

## 2021-05-30 MED ORDER — ALPRAZOLAM 1 MG PO TABS
1.0000 mg | ORAL_TABLET | ORAL | 1 refills | Status: DC | PRN
Start: 2021-05-30 — End: 2021-07-24

## 2021-05-30 MED ORDER — ALPRAZOLAM 1 MG PO TABS: 1.0000 mg | ORAL_TABLET | ORAL | 1 refills | Status: DC | PRN

## 2021-06-01 ENCOUNTER — Other Ambulatory Visit: Payer: Self-pay | Admitting: Oncology

## 2021-06-01 DIAGNOSIS — C50919 Malignant neoplasm of unspecified site of unspecified female breast: Secondary | ICD-10-CM

## 2021-06-01 NOTE — Telephone Encounter (Signed)
Refilled two days ago by [employer]

## 2021-06-05 DIAGNOSIS — Z7901 Long term (current) use of anticoagulants: Secondary | ICD-10-CM | POA: Diagnosis not present

## 2021-06-20 ENCOUNTER — Encounter: Payer: Self-pay | Admitting: Oncology

## 2021-06-22 ENCOUNTER — Inpatient Hospital Stay: Payer: Medicare Other

## 2021-06-22 ENCOUNTER — Other Ambulatory Visit: Payer: Self-pay

## 2021-06-22 ENCOUNTER — Encounter: Payer: Self-pay | Admitting: *Deleted

## 2021-06-22 ENCOUNTER — Telehealth: Payer: Self-pay

## 2021-06-22 ENCOUNTER — Other Ambulatory Visit: Payer: Self-pay | Admitting: *Deleted

## 2021-06-22 ENCOUNTER — Inpatient Hospital Stay: Payer: Medicare Other | Attending: Oncology

## 2021-06-22 ENCOUNTER — Other Ambulatory Visit: Payer: Medicare Other

## 2021-06-22 ENCOUNTER — Ambulatory Visit: Payer: Medicare Other

## 2021-06-22 VITALS — BP 126/76 | HR 66 | Temp 98.6°F | Resp 16 | Wt 270.0 lb

## 2021-06-22 DIAGNOSIS — C50919 Malignant neoplasm of unspecified site of unspecified female breast: Secondary | ICD-10-CM

## 2021-06-22 DIAGNOSIS — Z7901 Long term (current) use of anticoagulants: Secondary | ICD-10-CM | POA: Insufficient documentation

## 2021-06-22 DIAGNOSIS — I5032 Chronic diastolic (congestive) heart failure: Secondary | ICD-10-CM

## 2021-06-22 DIAGNOSIS — C50812 Malignant neoplasm of overlapping sites of left female breast: Secondary | ICD-10-CM | POA: Insufficient documentation

## 2021-06-22 DIAGNOSIS — Z5112 Encounter for antineoplastic immunotherapy: Secondary | ICD-10-CM | POA: Diagnosis not present

## 2021-06-22 DIAGNOSIS — Z23 Encounter for immunization: Secondary | ICD-10-CM

## 2021-06-22 DIAGNOSIS — Z171 Estrogen receptor negative status [ER-]: Secondary | ICD-10-CM

## 2021-06-22 DIAGNOSIS — Z95828 Presence of other vascular implants and grafts: Secondary | ICD-10-CM

## 2021-06-22 DIAGNOSIS — C50011 Malignant neoplasm of nipple and areola, right female breast: Secondary | ICD-10-CM

## 2021-06-22 DIAGNOSIS — C50911 Malignant neoplasm of unspecified site of right female breast: Secondary | ICD-10-CM | POA: Insufficient documentation

## 2021-06-22 LAB — PROTIME-INR
INR: 4.2 (ref 0.8–1.2)
Prothrombin Time: 40.1 seconds — ABNORMAL HIGH (ref 11.4–15.2)

## 2021-06-22 LAB — CBC WITH DIFFERENTIAL (CANCER CENTER ONLY)
Abs Immature Granulocytes: 0.02 10*3/uL (ref 0.00–0.07)
Basophils Absolute: 0 10*3/uL (ref 0.0–0.1)
Basophils Relative: 0 %
Eosinophils Absolute: 0.1 10*3/uL (ref 0.0–0.5)
Eosinophils Relative: 1 %
HCT: 33.3 % — ABNORMAL LOW (ref 36.0–46.0)
Hemoglobin: 11.7 g/dL — ABNORMAL LOW (ref 12.0–15.0)
Immature Granulocytes: 0 %
Lymphocytes Relative: 23 %
Lymphs Abs: 1.8 10*3/uL (ref 0.7–4.0)
MCH: 28.3 pg (ref 26.0–34.0)
MCHC: 35.1 g/dL (ref 30.0–36.0)
MCV: 80.4 fL (ref 80.0–100.0)
Monocytes Absolute: 0.4 10*3/uL (ref 0.1–1.0)
Monocytes Relative: 6 %
Neutro Abs: 5.4 10*3/uL (ref 1.7–7.7)
Neutrophils Relative %: 70 %
Platelet Count: 245 10*3/uL (ref 150–400)
RBC: 4.14 MIL/uL (ref 3.87–5.11)
RDW: 14.6 % (ref 11.5–15.5)
WBC Count: 7.7 10*3/uL (ref 4.0–10.5)
nRBC: 0 % (ref 0.0–0.2)

## 2021-06-22 LAB — CMP (CANCER CENTER ONLY)
ALT: 23 U/L (ref 0–44)
AST: 16 U/L (ref 15–41)
Albumin: 3.6 g/dL (ref 3.5–5.0)
Alkaline Phosphatase: 73 U/L (ref 38–126)
Anion gap: 11 (ref 5–15)
BUN: 15 mg/dL (ref 8–23)
CO2: 22 mmol/L (ref 22–32)
Calcium: 9.2 mg/dL (ref 8.9–10.3)
Chloride: 108 mmol/L (ref 98–111)
Creatinine: 1.05 mg/dL — ABNORMAL HIGH (ref 0.44–1.00)
GFR, Estimated: 58 mL/min — ABNORMAL LOW (ref >60)
Glucose, Bld: 111 mg/dL — ABNORMAL HIGH (ref 70–99)
Potassium: 4.1 mmol/L (ref 3.5–5.1)
Sodium: 141 mmol/L (ref 135–145)
Total Bilirubin: 0.9 mg/dL (ref 0.3–1.2)
Total Protein: 7.6 g/dL (ref 6.5–8.1)

## 2021-06-22 MED ORDER — HEPARIN SOD (PORK) LOCK FLUSH 100 UNIT/ML IV SOLN
500.0000 [IU] | Freq: Once | INTRAVENOUS | Status: AC | PRN
  Administered 2021-06-22: 500 [IU]

## 2021-06-22 MED ORDER — SODIUM CHLORIDE 0.9% FLUSH
10.0000 mL | INTRAVENOUS | Status: DC | PRN
Start: 2021-06-22 — End: 2021-06-22

## 2021-06-22 MED ORDER — SODIUM CHLORIDE 0.9 % IV SOLN: Freq: Once | INTRAVENOUS | Status: AC

## 2021-06-22 MED ORDER — DIPHENHYDRAMINE HCL 25 MG PO CAPS
25.0000 mg | ORAL_CAPSULE | Freq: Once | ORAL | Status: AC
  Administered 2021-06-22: 25 mg via ORAL
  Filled 2021-06-22: qty 1

## 2021-06-22 MED ORDER — SODIUM CHLORIDE 0.9% FLUSH
10.0000 mL | INTRAVENOUS | Status: DC | PRN
  Administered 2021-06-22: 10 mL

## 2021-06-22 MED ORDER — INFLUENZA VAC A&B SA ADJ QUAD 0.5 ML IM PRSY
0.5000 mL | PREFILLED_SYRINGE | Freq: Once | INTRAMUSCULAR | Status: AC
  Administered 2021-06-22: 0.5 mL via INTRAMUSCULAR
  Filled 2021-06-22: qty 0.5

## 2021-06-22 MED ORDER — SODIUM CHLORIDE 0.9% FLUSH: 10.0000 mL | Freq: Once | INTRAVENOUS | Status: DC

## 2021-06-22 MED ORDER — LORAZEPAM 2 MG/ML IJ SOLN
1.0000 mg | Freq: Once | INTRAMUSCULAR | Status: AC | PRN
  Administered 2021-06-22: 1 mg via INTRAVENOUS
  Filled 2021-06-22: qty 1

## 2021-06-22 MED ORDER — ACETAMINOPHEN 325 MG PO TABS
650.0000 mg | ORAL_TABLET | Freq: Once | ORAL | Status: AC
  Administered 2021-06-22: 650 mg via ORAL
  Filled 2021-06-22: qty 2

## 2021-06-22 MED ORDER — TRASTUZUMAB-DKST CHEMO 150 MG IV SOLR
750.0000 mg | Freq: Once | INTRAVENOUS | Status: AC
  Administered 2021-06-22: 750 mg via INTRAVENOUS
  Filled 2021-06-22: qty 35.72

## 2021-06-22 NOTE — Progress Notes (Unsigned)
Critical INR 4.2 called to Lennie Odor, RN at 1240 by Prudencio Burly

## 2021-06-22 NOTE — Telephone Encounter (Signed)
Called pt to make her aware her INR is 4.2. Per MD pt should stop Coumadin for 2 days then restart at 5 mg QD. Pt knows to call with any concerns.

## 2021-06-22 NOTE — Patient Instructions (Signed)
[facility]   Discharge Instructions: Thank you for choosing [facility] to provide your oncology and hematology care.   If you have a lab appointment with the Cancer Center, please go directly to the Cancer Center and check in at the registration area.   Wear comfortable clothing and clothing appropriate for easy access to any Portacath or PICC line.   We strive to give you quality time with your provider. You may need to reschedule your appointment if you arrive late (15 or more minutes).  Arriving late affects you and other patients whose appointments are after yours.  Also, if you miss three or more appointments without notifying the office, you may be dismissed from the clinic at the provider's discretion.      For prescription refill requests, have your pharmacy contact our office and allow 72 hours for refills to be completed.    Today you received the following chemotherapy and/or immunotherapy agents: trastuzumab-dkst.      To help prevent nausea and vomiting after your treatment, we encourage you to take your nausea medication as directed.  BELOW ARE SYMPTOMS THAT SHOULD BE REPORTED IMMEDIATELY: *FEVER GREATER THAN 100.4 F (38 C) OR HIGHER *CHILLS OR SWEATING *NAUSEA AND VOMITING THAT IS NOT CONTROLLED WITH YOUR NAUSEA MEDICATION *UNUSUAL SHORTNESS OF BREATH *UNUSUAL BRUISING OR BLEEDING *URINARY PROBLEMS (pain or burning when urinating, or frequent urination) *BOWEL PROBLEMS (unusual diarrhea, constipation, pain near the anus) TENDERNESS IN MOUTH AND THROAT WITH OR WITHOUT PRESENCE OF ULCERS (sore throat, sores in mouth, or a toothache) UNUSUAL RASH, SWELLING OR PAIN  UNUSUAL VAGINAL DISCHARGE OR ITCHING   Items with * indicate a potential emergency and should be followed up as soon as possible or go to the Emergency Department if any problems should occur.  Please show the CHEMOTHERAPY ALERT CARD or IMMUNOTHERAPY ALERT CARD at  check-in to the Emergency Department and triage nurse.  Should you have questions after your visit or need to cancel or reschedule your appointment, please contact [facility]  Dept: [phone]  and follow the prompts.  Office hours are 8:00 a.m. to 4:30 p.m. Monday - Friday. Please note that voicemails left after 4:00 p.m. may not be returned until the following business day.  We are closed weekends and major holidays. You have access to a nurse at all times for urgent questions. Please call the main number to the clinic Dept: [phone] and follow the prompts.   For any non-urgent questions, you may also contact your provider using MyChart. We now offer e-Visits for anyone 18 and older to request care online for non-urgent symptoms. For details visit [email or web address].   Also download the MyChart app! Go to the app store, search "MyChart", open the app, select [facility], and log in with your MyChart username and password.  Due to Covid, a mask is required upon entering the hospital/clinic. If you do not have a mask, one will be given to you upon arrival. For doctor visits, patients may have 1 support person aged 18 or older with them. For treatment visits, patients cannot have anyone with them due to current Covid guidelines and our immunocompromised population.    

## 2021-07-17 ENCOUNTER — Other Ambulatory Visit: Payer: Self-pay

## 2021-07-17 DIAGNOSIS — C50012 Malignant neoplasm of nipple and areola, left female breast: Secondary | ICD-10-CM

## 2021-07-17 DIAGNOSIS — I5032 Chronic diastolic (congestive) heart failure: Secondary | ICD-10-CM

## 2021-07-17 DIAGNOSIS — D689 Coagulation defect, unspecified: Secondary | ICD-10-CM

## 2021-07-18 ENCOUNTER — Other Ambulatory Visit: Payer: Self-pay | Admitting: *Deleted

## 2021-07-19 ENCOUNTER — Inpatient Hospital Stay: Payer: Medicare Other

## 2021-07-19 ENCOUNTER — Other Ambulatory Visit: Payer: Self-pay

## 2021-07-19 ENCOUNTER — Inpatient Hospital Stay: Payer: Medicare Other | Attending: Oncology

## 2021-07-19 ENCOUNTER — Other Ambulatory Visit: Payer: Self-pay | Admitting: Oncology

## 2021-07-19 ENCOUNTER — Inpatient Hospital Stay (HOSPITAL_BASED_OUTPATIENT_CLINIC_OR_DEPARTMENT_OTHER): Payer: Medicare Other | Admitting: Oncology

## 2021-07-19 VITALS — BP 160/79 | HR 82 | Temp 98.1°F | Resp 18 | Wt 279.1 lb

## 2021-07-19 DIAGNOSIS — Z7901 Long term (current) use of anticoagulants: Secondary | ICD-10-CM | POA: Diagnosis not present

## 2021-07-19 DIAGNOSIS — Z171 Estrogen receptor negative status [ER-]: Secondary | ICD-10-CM

## 2021-07-19 DIAGNOSIS — D689 Coagulation defect, unspecified: Secondary | ICD-10-CM

## 2021-07-19 DIAGNOSIS — C50012 Malignant neoplasm of nipple and areola, left female breast: Secondary | ICD-10-CM | POA: Diagnosis not present

## 2021-07-19 DIAGNOSIS — C50011 Malignant neoplasm of nipple and areola, right female breast: Secondary | ICD-10-CM | POA: Diagnosis not present

## 2021-07-19 DIAGNOSIS — Z5112 Encounter for antineoplastic immunotherapy: Secondary | ICD-10-CM | POA: Insufficient documentation

## 2021-07-19 DIAGNOSIS — C50919 Malignant neoplasm of unspecified site of unspecified female breast: Secondary | ICD-10-CM

## 2021-07-19 DIAGNOSIS — C50812 Malignant neoplasm of overlapping sites of left female breast: Secondary | ICD-10-CM | POA: Insufficient documentation

## 2021-07-19 DIAGNOSIS — Z95828 Presence of other vascular implants and grafts: Secondary | ICD-10-CM

## 2021-07-19 DIAGNOSIS — C50911 Malignant neoplasm of unspecified site of right female breast: Secondary | ICD-10-CM | POA: Insufficient documentation

## 2021-07-19 LAB — CMP (CANCER CENTER ONLY)
ALT: 10 U/L (ref 0–44)
AST: 11 U/L — ABNORMAL LOW (ref 15–41)
Albumin: 3.3 g/dL — ABNORMAL LOW (ref 3.5–5.0)
Alkaline Phosphatase: 68 U/L (ref 38–126)
Anion gap: 7 (ref 5–15)
BUN: 25 mg/dL — ABNORMAL HIGH (ref 8–23)
CO2: 27 mmol/L (ref 22–32)
Calcium: 8.8 mg/dL — ABNORMAL LOW (ref 8.9–10.3)
Chloride: 108 mmol/L (ref 98–111)
Creatinine: 0.99 mg/dL (ref 0.44–1.00)
GFR, Estimated: >60 mL/min (ref >60)
Glucose, Bld: 99 mg/dL (ref 70–99)
Potassium: 4 mmol/L (ref 3.5–5.1)
Sodium: 142 mmol/L (ref 135–145)
Total Bilirubin: 0.6 mg/dL (ref 0.3–1.2)
Total Protein: 7.1 g/dL (ref 6.5–8.1)

## 2021-07-19 LAB — CBC WITH DIFFERENTIAL (CANCER CENTER ONLY)
Abs Immature Granulocytes: 0.02 10*3/uL (ref 0.00–0.07)
Basophils Absolute: 0.1 10*3/uL (ref 0.0–0.1)
Basophils Relative: 1 %
Eosinophils Absolute: 0.2 10*3/uL (ref 0.0–0.5)
Eosinophils Relative: 2 %
HCT: 31.9 % — ABNORMAL LOW (ref 36.0–46.0)
Hemoglobin: 10.8 g/dL — ABNORMAL LOW (ref 12.0–15.0)
Immature Granulocytes: 0 %
Lymphocytes Relative: 34 %
Lymphs Abs: 3.4 10*3/uL (ref 0.7–4.0)
MCH: 27.6 pg (ref 26.0–34.0)
MCHC: 33.9 g/dL (ref 30.0–36.0)
MCV: 81.4 fL (ref 80.0–100.0)
Monocytes Absolute: 1.1 10*3/uL — ABNORMAL HIGH (ref 0.1–1.0)
Monocytes Relative: 11 %
Neutro Abs: 5.2 10*3/uL (ref 1.7–7.7)
Neutrophils Relative %: 52 %
Platelet Count: 249 10*3/uL (ref 150–400)
RBC: 3.92 MIL/uL (ref 3.87–5.11)
RDW: 15.3 % (ref 11.5–15.5)
WBC Count: 10 10*3/uL (ref 4.0–10.5)
nRBC: 0 % (ref 0.0–0.2)

## 2021-07-19 LAB — PROTIME-INR
INR: 7.8 (ref 0.8–1.2)
Prothrombin Time: 65.9 seconds — ABNORMAL HIGH (ref 11.4–15.2)

## 2021-07-19 MED ORDER — SODIUM CHLORIDE 0.9 % IV SOLN: Freq: Once | INTRAVENOUS | Status: AC

## 2021-07-19 MED ORDER — SODIUM CHLORIDE 0.9% FLUSH
10.0000 mL | INTRAVENOUS | Status: DC | PRN
  Administered 2021-07-19: 10 mL

## 2021-07-19 MED ORDER — LORAZEPAM 2 MG/ML IJ SOLN
1.0000 mg | Freq: Once | INTRAMUSCULAR | Status: AC | PRN
  Administered 2021-07-19: 1 mg via INTRAVENOUS
  Filled 2021-07-19: qty 1

## 2021-07-19 MED ORDER — TRASTUZUMAB-DKST CHEMO 150 MG IV SOLR
750.0000 mg | Freq: Once | INTRAVENOUS | Status: AC
  Administered 2021-07-19: 750 mg via INTRAVENOUS
  Filled 2021-07-19: qty 35.72

## 2021-07-19 MED ORDER — DIPHENHYDRAMINE HCL 25 MG PO CAPS
25.0000 mg | ORAL_CAPSULE | Freq: Once | ORAL | Status: AC
  Administered 2021-07-19: 25 mg via ORAL
  Filled 2021-07-19: qty 1

## 2021-07-19 MED ORDER — ACETAMINOPHEN 325 MG PO TABS
650.0000 mg | ORAL_TABLET | Freq: Once | ORAL | Status: AC
  Administered 2021-07-19: 650 mg via ORAL
  Filled 2021-07-19: qty 2

## 2021-07-19 MED ORDER — HEPARIN SOD (PORK) LOCK FLUSH 100 UNIT/ML IV SOLN
500.0000 [IU] | Freq: Once | INTRAVENOUS | Status: AC | PRN
  Administered 2021-07-19: 500 [IU]

## 2021-07-19 NOTE — [place] Notes (Signed)
[place]  [phone] [phone]    ID: [name]   DOB: [birth year]  MR#: [id number]  LOV#:[id number]   Patient Care Team: Ernestene Kiel, MD as PCP - General (Internal Medicine) Haze Rushing., MD as Consulting Physician (Pain Medicine) Joya Salm, MD as Referring Physician (Orthopedic Surgery) Jaymes Graff, DO as Consulting Physician (Orthopedic Surgery) Larey Dresser, MD as Consulting Physician (Cardiology) Bensimhon, Shaune Pascal, MD as Consulting Physician (Cardiology) Lorelle Gibbs, MD (Radiology) Tommy Medal, Lavell Islam, MD as Consulting Physician (Infectious Diseases) Gery Pray, MD as Consulting Physician (Radiation Oncology) Dorn Hartshorne, Virgie Dad, MD as Consulting Physician (Oncology)   CHIEF COMPLAINT: Metastatic HER-2 positive, estrogen receptor negative breast cancer (s/p bilateral mastectomies)  CURRENT TREATMENT: Trastuzumab every 28 days; lifelong warfarin   INTERVAL HISTORY: [name] returns today for follow-up and treatment of her estrogen receptor negative, but HER2 amplified breast cancer.   She continues on trastuzumab, which she receives every 28 days.  She has absolutely no side effects related to this that she is aware of.   Since her last visit, she underwent repeat echocardiogram on 05/10/2021 showing an ejection fraction of 60-65%. This is stable from prior in 08/2020.   She is on lifelong warfarin. She tolerates this well and without any noticeable side effects.  Lab Results  Component Value Date   INR 4.2 (HH) 06/22/2021   INR 1.3 (H) 05/25/2021   INR 1.2 04/27/2021   INR 2.2 (H) 03/30/2021   INR 1.3 (H) 03/02/2021    REVIEW OF SYSTEMS: [name] was having trouble with her Coumadin INR but she has gotten herself a pillbox and now she is much more reliable she says in taking the medication.  She says she hurts "a lot" and in many places, mostly the hips and lower back.  She is also very  concerned about her husband who is currently back in the hospital with another foot infection.  She had a 3 AM call from her niece recently and the niece talked for 3 hours.  Basically [name] is the anchor for her entire family and this does cause quite a bit of stress.  A detailed review of systems was otherwise stable.   COVID 19 VACCINATION STATUS: fully vaccinated (Moderna); infection 04/08/2020; booster late September 2021   BREAST CANCER HISTORY:   From the earlier summary:  [name] is 70 years old [name] female.  This woman has been in good health all of her life.  She noted a swelling and discomfort in her right breast in June 2004.  She was seen in the Emergency Room in [place] and was treated for mastitis.  She was treated for a number of months with mastitis and the swelling did not get better. She was given hydrocodone and Cipro.  Finally, the swelling did get better and ultimately the nipple became retracted and she noticed some dimpling in her skin. She had a mammogram in July of 2004 in [place] with subsequent mammogram on May 27, 2003, by Dr. Isaiah Blakes.  Mammogram done on September 30 showed marked increased density in the left breast.  Biopsy was performed the same day.  It was noted at the 12 o'clock position, deep in the breast was a focal hypoechoic mass, at least 3.5 cm in diameter.  Biopsy did in fact show invasive in situ mammary carcinoma. This was felt to be both at least intermediate, high grade.  No definite lymphovascular invasion was identified. ER and PR negative, Her2  testing positive. [name] continues to have pain in her breast.  She continues to take hydrocodone a number of times a day.  She has been seen by Dr. Rosana Hoes, who felt that neoadjuvant chemotherapy would be required.    Initial staging studies showed evidence of liver and lung mets.   Patient also has evidence of bone lesions. Patient started neoadjuvant chemotherapy, Taxotere/Carbo/Herceptin in  October 2004.   Patient had a CT scan in December 2004 which demonstrated extensive clot in the SVC innominate vein, bilateral jugular vein and  She was started on anticoagulation therapy. She received a total of 6 cycles of Taxotere/Carbo/Herceptin, completed in April 2005."  Patient has been on  trastuzumab continued indefinitely; has also received lapatinib and capecitabine for variable intervals in 2007-2008. Most recent echo 12/01/2013 showed an ejection fraction of 55%. She is status post bilateral mastectomies with bilateral axillary lymph node dissection 12/07/2004, showing (a) on the right, a mypT1c ypN1 invasive ductal carcinoma, grade 3, estrogen and progesterone receptor negative, HER-2 positive, with an MIB-1 of 31% (b) on the left, ypT2 ypN1 invasive ductal carcinoma, grade 2, estrogen and progesterone receptor negative, HER-2 positive, with an MIB-1 of 35%. She is Status post radiation June through July of 2006, to the right chest wall, left chest wall, bilateral supraclavicular fossae, and bilateral axillary boosts; with additional radiation to the right and left chest walls and the central chest wall completed November of 2007. She is status post ixempra x9 completed August of 2009. 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 Patient has chronic pain, with negative PET scan 08/24/2013 (no evidence of active cancer). On Neurontin and Tramadol therapy.  Her subsequent history is as detailed below   PAST MEDICAL HISTORY: Past Medical History:  Diagnosis Date   Breast cancer ([place])    mets to liver and lung   Breast cancer metastasized to multiple sites [facility]) 02/26/2013   CHF (congestive heart failure) ([place])    History of chemotherapy Feb. 2006   taxotere/herceptin/carboplatin   Hypertension    Neuropathy    Radiation 07/31/2006   left upper chest   Radiation 06/17/2006-06/27/2006   6480 cGy bilat. chest  wall   SVC syndrome    Thrombosis     PAST SURGICAL HISTORY: Past Surgical History:  Procedure Laterality Date   ANKLE SURGERY     BACK SURGERY     CHOLECYSTECTOMY  1989   MASTECTOMY Bilateral    PERIPHERALLY INSERTED CENTRAL CATHETER INSERTION     TUBAL LIGATION  1986    FAMILY HISTORY Family History  Problem Relation Age of Onset   Heart failure Father    Cancer Father        Prostate cancer   Heart failure Brother    Cancer Brother        Prostate cancer   Diabetes Maternal Aunt   She had three brothers, one died of gunshot wound, one of complications of diabetes mellitus and one of myocardial infarction.  She has no sisters.  Mother died of complications of brain metastasis in 15.  Father has had a myocardial infarction in 1999.  No history of breast or ovarian cancer in the family.     GYNECOLOGIC HISTORY:    No LMP recorded. Patient is postmenopausal. Menarche at age 83.   Gravida 3, para 3.   First live birth at age 89.   No history of breast feeding. No history of hormonal replacement  therapy.    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 He used to work as a Art gallery manager, but is now retired and disabled. The patient has three children, Monette who lives in [place] and works as a Hydrographic surveyor, Financial risk analyst who lives in [place] and works as a Administrator, and [place] who lives in [place] and also works as a Hydrographic surveyor. The patient has 14 grandchildren and 4 great-grandchildren. She attends a [employer].   ADVANCED DIRECTIVES: In the absence of any documentation to the contrary, the patient's spouse is their HCPOA.    HEALTH MAINTENANCE:  Social History   Tobacco Use   Smoking status: Never   Smokeless tobacco: Never  Substance Use Topics   Alcohol use: Yes    Comment: occasional   Drug use: No      Colonoscopy: Never and "I don't want one"  PAP:  1987  Bone density:  Never   Lipid panel:  Not on file   Allergies  Allergen Reactions   Penicillins Hives    PATIENT HAS TOLERATED CEPHALOSPORINGS   Adhesive [Tape] Other (See Comments)    Tears skin     Current Outpatient Medications  Medication Sig Dispense Refill   acetaminophen (TYLENOL) 500 MG tablet Take 1,000 mg by mouth every 6 (six) hours as needed for mild pain or fever.      albuterol (PROVENTIL HFA;VENTOLIN HFA) 108 (90 BASE) MCG/ACT inhaler Inhale 2 puffs into the lungs every 6 (six) hours as needed for wheezing. 1 Inhaler 5   ALPRAZolam (XANAX) 1 MG tablet Take 1 tablet (1 mg total) by mouth as needed for anxiety. 30 tablet 1   amLODipine (NORVASC) 10 MG tablet Take 1 tablet (10 mg total) by mouth every morning. 30 tablet 4   baclofen (LIORESAL) 10 MG tablet TAKE 1 TABLET BY MOUTH THREE TIMES DAILY AS NEEDED FOR MUSCLE SPASMS 270 tablet 0   carvedilol (COREG) 12.5 MG tablet TAKE 1 TABLET(12.5 MG) BY MOUTH TWICE DAILY 60 tablet 11   diclofenac sodium (VOLTAREN) 1 % GEL Apply 2 g topically daily as needed (for pain). Apply to knees and shoulders 100 g 6   furosemide (LASIX) 40 MG tablet TAKE 1 TABLET(40 MG) BY MOUTH DAILY 30 tablet 3   gabapentin (NEURONTIN) 300 MG capsule Two capsules by mouth TID 540 capsule 0   losartan (COZAAR) 100 MG tablet Take 100 mg by mouth every morning.     oxyCODONE-acetaminophen (PERCOCET) 10-325 MG tablet Take 1 tablet by mouth 4 (four) times daily as needed.     potassium chloride SA (KLOR-CON) 20 MEQ tablet Take 1 tablet (20 mEq total) by mouth daily as needed. With use of lasix 30 tablet 3   predniSONE (DELTASONE) 5 MG tablet Take 5 mg by mouth daily in the afternoon.     spironolactone (ALDACTONE) 25 MG tablet TAKE 1 TABLET(25 MG) BY MOUTH DAILY 30 tablet 11   temazepam (RESTORIL) 30 MG capsule TAKE ONE CAPSULE BY MOUTH EVERY NIGHT AT BEDTIME AS NEEDED FOR SLEEP 30 capsule 1    warfarin (COUMADIN) 2.5 MG tablet TAKE ONE TABLET BY MOUTH DAILY. TAKE WITH 5 MG PER MD INSTRUCTIONS. 30 tablet 3   warfarin (COUMADIN) 5 MG tablet TAKE 1 TABLET BY MOUTH ON ODD NUMBERED DAYS PLUS 1 AND 1/2 TABLETS ON EVEN NUMBERED DAYS 90 tablet 4  No current facility-administered medications for this visit.   Facility-Administered Medications Ordered in Other Visits  Medication Dose Route Frequency Provider Last Rate Last Admin   sodium chloride flush (NS) 0.9 % injection 10 mL  10 mL Intravenous PRN Cassi Jenne, Virgie Dad, MD   10 mL at 12/15/15 1200   sodium chloride flush (NS) 0.9 % injection 10 mL  10 mL Intracatheter PRN Chanan Detwiler, Virgie Dad, MD   10 mL at 08/14/18 1116   sodium chloride flush (NS) 0.9 % injection 10 mL  10 mL Intracatheter PRN Ethelda Deangelo, Virgie Dad, MD        OBJECTIVE: African-American woman in no acute distress  Vitals:   07/19/21 1237  BP: (!) 160/79  Pulse: 82  Resp: 18  Temp: 98.1 F (36.7 C)  SpO2: 95%     Body mass index is 49.44 kg/m.    ECOG FS: 2 Filed Weights   07/19/21 1237  Weight: 279 lb 1.6 oz (126.6 kg)   Sclerae unicteric, EOMs intact Wearing a mask No cervical or supraclavicular adenopathy Lungs no rales or rhonchi Heart regular rate and rhythm Abd soft, nontender, positive bowel sounds MSK no focal spinal tenderness, no upper extremity lymphedema Neuro: nonfocal, well oriented, appropriate affect Breasts: Status post bilateral mastectomies.  There is no evidence of chest wall recurrence.  Both axillae are benign.   LAB RESULTS: Lab Results  Component Value Date   WBC 10.0 07/19/2021   NEUTROABS 5.2 07/19/2021   HGB 10.8 (L) 07/19/2021   HCT 31.9 (L) 07/19/2021   MCV 81.4 07/19/2021   PLT 249 07/19/2021      Chemistry      Component Value Date/Time   NA 141 06/22/2021 0855   NA 143 08/14/2017 0811   K 4.1 06/22/2021 0855   K 3.8 08/14/2017 0811   CL 108 06/22/2021 0855   CL 104 01/30/2013 0850   CO2 22 06/22/2021 0855    CO2 24 08/14/2017 0811   BUN 15 06/22/2021 0855   BUN 14.9 08/14/2017 0811   CREATININE 1.05 (H) 06/22/2021 0855   CREATININE 0.9 08/14/2017 0811      Component Value Date/Time   CALCIUM 9.2 06/22/2021 0855   CALCIUM 9.4 08/14/2017 0811   ALKPHOS 73 06/22/2021 0855   ALKPHOS 73 08/14/2017 0811   AST 16 06/22/2021 0855   AST 10 08/14/2017 0811   ALT 23 06/22/2021 0855   ALT 10 08/14/2017 0811   BILITOT 0.9 06/22/2021 0855   BILITOT 0.62 08/14/2017 0811      STUDIES: No results found.   ASSESSMENT: 70 y.o.  [place], [place], woman  (1)  with a history of inflammatory right breast cancer metastatic at presentation September 2004 with involvement of liver and bone, HER-2 positive, estrogen and progesterone receptor negative  (2) treated with carboplatin, docetaxel and Herceptin x 6 completed April 2005  (3) trastuzumab continued indefinitely;   (a) has also received lapatinib and capecitabine for variable intervals in 2007-2008.  (b) Every 6 month echo: 09/24/2017 showed an ejection fraction in the 55-60%  (c) echocardiogram 03/07/2018 showed an ejection fraction in the 55-60%  (d) cardiogram 09/30/2018 showed ejection fraction in the 60-65% range.  (e) echocardiogram November 30, 2019 showed an ejection fraction in the 55-60% range  (f) echo 08/31/2020  (4) status post bilateral mastectomies with bilateral axillary lymph node dissection 12/07/2004, showing  (a) on the right, a mypT1c ypN1 invasive ductal carcinoma, grade 3, estrogen and progesterone receptor negative, HER-2 positive, with an  MIB-1 of 31%  (b) on the left, ypT2 ypN1 invasive ductal carcinoma, grade 2, estrogen and progesterone receptor negative, HER-2 positive, with an MIB-1 of 35%.  (5)  Status post radiation June through July of 2006, to the right chest wall, left chest wall, bilateral supraclavicular fossae, and bilateral axillary boosts; with additional radiation to the right and left chest walls and the  central chest wall completed November of 2007  (6) status post Ixempra x9 completed August of 2009.  (7) history of superior vena caval syndrome, on life long anticoagulation   (8)  History of chemotherapy-induced neuropathy.   (9)  chronic pain, with negative PET scan 08/24/2013 (no evidence of active cancer). On Neurontin and Tramadol  (a) repeat PET scan December 2016 again negative.  (b) Repeat PET scan 04/23/2017 shows no active malignancy  (c) PET scan 07/09/2018 shows no evidence of active malignancy.  (d) PET scan 12/26/2018 shows no evidence of active disease  (e) chest CT 08/05/2020 shows no evidence of progressive disease  (f) to CT 03/24/2021 shows no evidence of active disease.  (10) right upper extremity cellulitis, no bacteremia; treated with cephalexin /doxycycline for 2 weeks, with resolution  (11) subacute fracture right humerus, nondisplaced, status post fall 03/27/2017   PLAN: [name] is now 18 years out from her initial diagnosis of metastatic breast cancer.  There is no evidence of active disease.  This is very favorable.  She is tolerating Herceptin well and the plan is to continue that on a monthly basis indefinitely.  She usually has echocardiography every 6 months.  She also has a history of coagulopathy and is on lifelong Coumadin.  We try to keep her INR between 2 and 3 as per standard of care.  This can vary with compliance and diet issues.  Overall however Vicie is doing quite well.  She will return in 4 weeks for Herceptin and in 8 weeks for the same plus a visit.  She knows to call for any other issue that may develop before the next visit  Total encounter time 25 minutes.[name] Jews C. Wai Litt, MD 07/19/21 1:06 PM Medical Oncology and Hematology [facility] [place], [place] [address] Tel. [phone]    Fax. [phone]   I, Wilburn Mylar, am acting as scribe for Dr. Virgie Dad. Tres Grzywacz.  I, Lurline Del MD, have reviewed the above documentation for accuracy and completeness, and I agree with the above.    *Total Encounter Time as defined by the Centers for Medicare and Medicaid Services includes, in addition to the face-to-face time of a patient visit (documented in the note above) non-face-to-face time: obtaining and reviewing outside history, ordering and reviewing medications, tests or procedures, care coordination (communications with other health care professionals or caregivers) and documentation in the medical record.

## 2021-07-19 NOTE — Patient Instructions (Signed)
[facility] MEDICAL [facility]COLOG[facility]   Discharge Instructions: Thank you for choosing [facility] to provide your oncology and hematology care.   If you have a lab appointment with the Cancer Center, please go directly to the Cancer Center and check in at the registration area.   Wear comfortable clothing and clothing appropriate for easy access to any Portacath or PICC line.   We strive to give you quality time with your provider. You may need to reschedule your appointment if you arrive late (15 or more minutes).  Arriving late affects you and other patients whose appointments are after yours.  Also, if you miss three or more appointments without notifying the office, you may be dismissed from the clinic at the provider's discretion.      For prescription refill requests, have your pharmacy contact our office and allow 72 hours for refills to be completed.    Today you received the following chemotherapy and/or immunotherapy agents: trastuzumab-dkst.      To help prevent nausea and vomiting after your treatment, we encourage you to take your nausea medication as directed.  BELOW ARE SYMPTOMS THAT SHOULD BE REPORTED IMMEDIATELY: *FEVER GREATER THAN 100.4 F (38 C) OR HIGHER *CHILLS OR SWEATING *NAUSEA AND VOMITING THAT IS NOT CONTROLLED WITH YOUR NAUSEA MEDICATION *UNUSUAL SHORTNESS OF BREATH *UNUSUAL BRUISING OR BLEEDING *URINARY PROBLEMS (pain or burning when urinating, or frequent urination) *BOWEL PROBLEMS (unusual diarrhea, constipation, pain near the anus) TENDERNESS IN MOUTH AND THROAT WITH OR WITHOUT PRESENCE OF ULCERS (sore throat, sores in mouth, or a toothache) UNUSUAL RASH, SWELLING OR PAIN  UNUSUAL VAGINAL DISCHARGE OR ITCHING   Items with * indicate a potential emergency and should be followed up as soon as possible or go to the Emergency Department if any problems should occur.  Please show the CHEMOTHERAPY ALERT CARD or IMMUNOTHERAPY ALERT CARD at  check-in to the Emergency Department and triage nurse.  Should you have questions after your visit or need to cancel or reschedule your appointment, please contact [facility] MEDICAL [facility]COLOG[facility]  Dept: [phone]  and follow the prompts.  Office hours are 8:00 a.m. to 4:30 p.m. Monday - Friday. Please note that voicemails left after 4:00 p.m. may not be returned until the following business day.  We are closed weekends and major holidays. You have access to a nurse at all times for urgent questions. Please call the main number to the clinic Dept: [phone] and follow the prompts.   For any non-urgent questions, you may also contact your provider using MyChart. We now offer e-Visits for anyone 18 and older to request care online for non-urgent symptoms. For details visit [email or web address].   Also download the MyChart app! Go to the app store, search "MyChart", open the app, select [facility], and log in with your MyChart username and password.  Due to Covid, a mask is required upon entering the hospital/clinic. If you do not have a mask, one will be given to you upon arrival. For doctor visits, patients may have 1 support person aged 18 or older with them. For treatment visits, patients cannot have anyone with them due to current Covid guidelines and our immunocompromised population.    

## 2021-07-19 NOTE — [place] Notes (Signed)
[place] INR is out of range.  We have asked her to stop for the next 2 days and then lower dose.  She will be retested with the next visit.

## 2021-07-19 NOTE — [place] Notes (Signed)
Pt was made aware of new coumadin dosing per Dr.magrinat. hold for 2 days, then 5mg  on mon, wed., Friday, and 2.5mg  on tue, thurs, sat, and sun. Pt agreeable to plan and has no further questions at this time.

## 2021-07-19 NOTE — [place] Notes (Unsigned)
PT=65.9 INR=7.8 RESULT GIVEN TO VAL DODD AT 1416.LB

## 2021-07-24 ENCOUNTER — Other Ambulatory Visit: Payer: Self-pay | Admitting: Oncology

## 2021-07-24 MED ORDER — ALPRAZOLAM 1 MG PO TABS: 1.0000 mg | ORAL_TABLET | ORAL | 1 refills | Status: DC | PRN

## 2021-07-24 MED ORDER — ALPRAZOLAM 1 MG PO TABS
1.0000 mg | ORAL_TABLET | ORAL | 1 refills | Status: DC | PRN
Start: 2021-07-24 — End: 2021-09-15

## 2021-07-27 ENCOUNTER — Telehealth: Payer: Self-pay | Admitting: Oncology

## 2021-07-27 ENCOUNTER — Other Ambulatory Visit: Payer: Self-pay | Admitting: Oncology

## 2021-07-27 DIAGNOSIS — C50919 Malignant neoplasm of unspecified site of unspecified female breast: Secondary | ICD-10-CM

## 2021-07-27 NOTE — Telephone Encounter (Signed)
Sch per 11/23 los, pt aware 

## 2021-08-15 ENCOUNTER — Other Ambulatory Visit: Payer: Self-pay | Admitting: Oncology

## 2021-08-15 DIAGNOSIS — C50919 Malignant neoplasm of unspecified site of unspecified female breast: Secondary | ICD-10-CM

## 2021-08-16 ENCOUNTER — Other Ambulatory Visit: Payer: Self-pay | Admitting: *Deleted

## 2021-08-16 DIAGNOSIS — C50012 Malignant neoplasm of nipple and areola, left female breast: Secondary | ICD-10-CM

## 2021-08-16 DIAGNOSIS — D689 Coagulation defect, unspecified: Secondary | ICD-10-CM

## 2021-08-17 ENCOUNTER — Inpatient Hospital Stay: Payer: Medicare Other | Attending: Oncology

## 2021-08-17 ENCOUNTER — Other Ambulatory Visit: Payer: Self-pay

## 2021-08-17 ENCOUNTER — Inpatient Hospital Stay: Payer: Medicare Other

## 2021-08-17 VITALS — BP 127/70 | HR 67 | Temp 98.9°F | Resp 20

## 2021-08-17 DIAGNOSIS — Z171 Estrogen receptor negative status [ER-]: Secondary | ICD-10-CM

## 2021-08-17 DIAGNOSIS — C50812 Malignant neoplasm of overlapping sites of left female breast: Secondary | ICD-10-CM | POA: Insufficient documentation

## 2021-08-17 DIAGNOSIS — Z7901 Long term (current) use of anticoagulants: Secondary | ICD-10-CM | POA: Diagnosis not present

## 2021-08-17 DIAGNOSIS — D689 Coagulation defect, unspecified: Secondary | ICD-10-CM

## 2021-08-17 DIAGNOSIS — Z95828 Presence of other vascular implants and grafts: Secondary | ICD-10-CM

## 2021-08-17 DIAGNOSIS — Z5112 Encounter for antineoplastic immunotherapy: Secondary | ICD-10-CM | POA: Insufficient documentation

## 2021-08-17 DIAGNOSIS — C50911 Malignant neoplasm of unspecified site of right female breast: Secondary | ICD-10-CM | POA: Diagnosis not present

## 2021-08-17 DIAGNOSIS — C50919 Malignant neoplasm of unspecified site of unspecified female breast: Secondary | ICD-10-CM

## 2021-08-17 LAB — CBC WITH DIFFERENTIAL (CANCER CENTER ONLY)
Abs Immature Granulocytes: 0.03 10*3/uL (ref 0.00–0.07)
Basophils Absolute: 0 10*3/uL (ref 0.0–0.1)
Basophils Relative: 0 %
Eosinophils Absolute: 0.1 10*3/uL (ref 0.0–0.5)
Eosinophils Relative: 1 %
HCT: 33.7 % — ABNORMAL LOW (ref 36.0–46.0)
Hemoglobin: 11.5 g/dL — ABNORMAL LOW (ref 12.0–15.0)
Immature Granulocytes: 0 %
Lymphocytes Relative: 22 %
Lymphs Abs: 2.2 10*3/uL (ref 0.7–4.0)
MCH: 27.8 pg (ref 26.0–34.0)
MCHC: 34.1 g/dL (ref 30.0–36.0)
MCV: 81.6 fL (ref 80.0–100.0)
Monocytes Absolute: 0.7 10*3/uL (ref 0.1–1.0)
Monocytes Relative: 7 %
Neutro Abs: 7.1 10*3/uL (ref 1.7–7.7)
Neutrophils Relative %: 70 %
Platelet Count: 291 10*3/uL (ref 150–400)
RBC: 4.13 MIL/uL (ref 3.87–5.11)
RDW: 15.5 % (ref 11.5–15.5)
WBC Count: 10.1 10*3/uL (ref 4.0–10.5)
nRBC: 0 % (ref 0.0–0.2)

## 2021-08-17 LAB — CMP (CANCER CENTER ONLY)
ALT: 9 U/L (ref 0–44)
AST: 11 U/L — ABNORMAL LOW (ref 15–41)
Albumin: 4 g/dL (ref 3.5–5.0)
Alkaline Phosphatase: 72 U/L (ref 38–126)
Anion gap: 7 (ref 5–15)
BUN: 19 mg/dL (ref 8–23)
CO2: 28 mmol/L (ref 22–32)
Calcium: 9.6 mg/dL (ref 8.9–10.3)
Chloride: 105 mmol/L (ref 98–111)
Creatinine: 0.89 mg/dL (ref 0.44–1.00)
GFR, Estimated: >60 mL/min (ref >60)
Glucose, Bld: 110 mg/dL — ABNORMAL HIGH (ref 70–99)
Potassium: 4.4 mmol/L (ref 3.5–5.1)
Sodium: 140 mmol/L (ref 135–145)
Total Bilirubin: 0.8 mg/dL (ref 0.3–1.2)
Total Protein: 7.6 g/dL (ref 6.5–8.1)

## 2021-08-17 LAB — PROTIME-INR
INR: 1.2 (ref 0.8–1.2)
Prothrombin Time: 15.5 seconds — ABNORMAL HIGH (ref 11.4–15.2)

## 2021-08-17 MED ORDER — SODIUM CHLORIDE 0.9 % IV SOLN: Freq: Once | INTRAVENOUS | Status: AC

## 2021-08-17 MED ORDER — LORAZEPAM 2 MG/ML IJ SOLN
1.0000 mg | Freq: Once | INTRAMUSCULAR | Status: AC | PRN
  Administered 2021-08-17: 14:00:00 1 mg via INTRAVENOUS
  Filled 2021-08-17: qty 1

## 2021-08-17 MED ORDER — SODIUM CHLORIDE 0.9% FLUSH
10.0000 mL | INTRAVENOUS | Status: DC | PRN
  Administered 2021-08-17: 12:00:00 10 mL

## 2021-08-17 MED ORDER — SODIUM CHLORIDE 0.9% FLUSH
10.0000 mL | INTRAVENOUS | Status: DC | PRN
  Administered 2021-08-17: 15:00:00 10 mL

## 2021-08-17 MED ORDER — DIPHENHYDRAMINE HCL 25 MG PO CAPS
25.0000 mg | ORAL_CAPSULE | Freq: Once | ORAL | Status: AC
  Administered 2021-08-17: 14:00:00 25 mg via ORAL
  Filled 2021-08-17: qty 1

## 2021-08-17 MED ORDER — HEPARIN SOD (PORK) LOCK FLUSH 100 UNIT/ML IV SOLN
500.0000 [IU] | Freq: Once | INTRAVENOUS | Status: AC | PRN
  Administered 2021-08-17: 15:00:00 500 [IU]

## 2021-08-17 MED ORDER — TRASTUZUMAB-DKST CHEMO 150 MG IV SOLR
750.0000 mg | Freq: Once | INTRAVENOUS | Status: AC
  Administered 2021-08-17: 14:00:00 750 mg via INTRAVENOUS
  Filled 2021-08-17: qty 35.72

## 2021-08-17 MED ORDER — ACETAMINOPHEN 325 MG PO TABS
650.0000 mg | ORAL_TABLET | Freq: Once | ORAL | Status: AC
  Administered 2021-08-17: 14:00:00 650 mg via ORAL
  Filled 2021-08-17: qty 2

## 2021-08-17 NOTE — Patient Instructions (Signed)
[facility]ICAL [facility]COL[facility]   Discharge Instructions: Thank you for choosing [facility] to provide your oncology and hematology care.   If you have a lab appointment with the Cancer Center, please go directly to the Cancer Center and check in at the registration area.   Wear comfortable clothing and clothing appropriate for easy access to any Portacath or PICC line.   We strive to give you quality time with your provider. You may need to reschedule your appointment if you arrive late (15 or more minutes).  Arriving late affects you and other patients whose appointments are after yours.  Also, if you miss three or more appointments without notifying the office, you may be dismissed from the clinic at the provider's discretion.      For prescription refill requests, have your pharmacy contact our office and allow 72 hours for refills to be completed.    Today you received the following chemotherapy and/or immunotherapy agents: trastuzumab-dkst.      To help prevent nausea and vomiting after your treatment, we encourage you to take your nausea medication as directed.  BELOW ARE SYMPTOMS THAT SHOULD BE REPORTED IMMEDIATELY: *FEVER GREATER THAN 100.4 F (38 C) OR HIGHER *CHILLS OR SWEATING *NAUSEA AND VOMITING THAT IS NOT CONTROLLED WITH YOUR NAUSEA MEDICATION *UNUSUAL SHORTNESS OF BREATH *UNUSUAL BRUISING OR BLEEDING *URINARY PROBLEMS (pain or burning when urinating, or frequent urination) *BOWEL PROBLEMS (unusual diarrhea, constipation, pain near the anus) TENDERNESS IN MOUTH AND THROAT WITH OR WITHOUT PRESENCE OF ULCERS (sore throat, sores in mouth, or a toothache) UNUSUAL RASH, SWELLING OR PAIN  UNUSUAL VAGINAL DISCHARGE OR ITCHING   Items with * indicate a potential emergency and should be followed up as soon as possible or go to the Emergency Department if any problems should occur.  Please show the CHEMOTHERAPY ALERT CARD or IMMUNOTHERAPY ALERT CARD at  check-in to the Emergency Department and triage nurse.  Should you have questions after your visit or need to cancel or reschedule your appointment, please contact [facility]  Dept: [phone]  and follow the prompts.  Office hours are 8:00 a.m. to 4:30 p.m. Monday - Friday. Please note that voicemails left after 4:00 p.m. may not be returned until the following business day.  We are closed weekends and major holidays. You have access to a nurse at all times for urgent questions. Please call the main number to the clinic Dept: [phone] and follow the prompts.   For any non-urgent questions, you may also contact your provider using MyChart. We now offer e-Visits for anyone 18 and older to request care online for non-urgent symptoms. For details visit [email or web address].   Also download the MyChart app! Go to the app store, search "MyChart", open the app, select , and log in with your MyChart username and password.  Due to Covid, a mask is required upon entering the hospital/clinic. If you do not have a mask, one will be given to you upon arrival. For doctor visits, patients may have 1 support person aged 18 or older with them. For treatment visits, patients cannot have anyone with them due to current Covid guidelines and our immunocompromised population.    

## 2021-08-22 ENCOUNTER — Other Ambulatory Visit: Payer: Self-pay | Admitting: Hematology and Oncology

## 2021-08-22 ENCOUNTER — Other Ambulatory Visit: Payer: Self-pay | Admitting: Oncology

## 2021-08-22 ENCOUNTER — Other Ambulatory Visit: Payer: Self-pay

## 2021-08-22 ENCOUNTER — Telehealth: Payer: Self-pay

## 2021-08-22 DIAGNOSIS — C50919 Malignant neoplasm of unspecified site of unspecified female breast: Secondary | ICD-10-CM

## 2021-08-22 MED ORDER — TEMAZEPAM 30 MG PO CAPS: 30.0000 mg | ORAL_CAPSULE | Freq: Every evening | ORAL | 0 refills | Status: DC | PRN

## 2021-08-22 NOTE — [place] Notes (Signed)
temaz

## 2021-08-22 NOTE — Telephone Encounter (Signed)
Pt called and states her phx is not able to refill temazepam. I called pt's phx and they state the Rx was never signed by provider. Request routed to Dr Chryl Heck. Pt is aware and verbalized thanks.

## 2021-08-23 ENCOUNTER — Encounter: Payer: Self-pay | Admitting: Oncology

## 2021-09-06 DIAGNOSIS — Z6841 Body Mass Index (BMI) 40.0 and over, adult: Secondary | ICD-10-CM | POA: Diagnosis not present

## 2021-09-06 DIAGNOSIS — F419 Anxiety disorder, unspecified: Secondary | ICD-10-CM | POA: Diagnosis not present

## 2021-09-06 DIAGNOSIS — M2550 Pain in unspecified joint: Secondary | ICD-10-CM | POA: Diagnosis not present

## 2021-09-06 DIAGNOSIS — I10 Essential (primary) hypertension: Secondary | ICD-10-CM | POA: Diagnosis not present

## 2021-09-12 ENCOUNTER — Other Ambulatory Visit: Payer: Self-pay

## 2021-09-12 DIAGNOSIS — D689 Coagulation defect, unspecified: Secondary | ICD-10-CM

## 2021-09-12 DIAGNOSIS — C50919 Malignant neoplasm of unspecified site of unspecified female breast: Secondary | ICD-10-CM

## 2021-09-13 ENCOUNTER — Other Ambulatory Visit: Payer: Self-pay | Admitting: Hematology and Oncology

## 2021-09-13 NOTE — Progress Notes (Signed)
Patient Care Team: Ernestene Kiel, MD as PCP - General (Internal Medicine) Haze Rushing., MD as Consulting Physician (Pain Medicine) Joya Salm, MD as Referring Physician (Orthopedic Surgery) Jaymes Graff, DO as Consulting Physician (Orthopedic Surgery) Larey Dresser, MD as Consulting Physician (Cardiology) Bensimhon, Shaune Pascal, MD as Consulting Physician (Cardiology) Lorelle Gibbs, MD (Radiology) Tommy Medal, Lavell Islam, MD as Consulting Physician (Infectious Diseases) Gery Pray, MD as Consulting Physician (Radiation Oncology) Nicholas Lose, MD as Consulting Physician (Hematology and Oncology)  DIAGNOSIS:    ICD-10-CM   1. Carcinoma of right breast metastatic to multiple sites [facility])  C50.911       SUMMARY OF ONCOLOGIC HISTORY: Oncology History  Breast cancer metastasized to multiple sites [facility])  02/26/2013 Initial Diagnosis   history of inflammatory right breast cancer metastatic at presentation September 2004 with involvement of liver and bone, HER-2 positive, estrogen and progesterone receptor negative      - 11/2013 Chemotherapy   carboplatin, docetaxel and Herceptin x 6 completed April 2005     Surgery   bilateral mastectomies with bilateral axillary lymph node dissection 12/07/2004, showing             (a) on the right, a mypT1c ypN1 invasive ductal carcinoma, grade 3, estrogen and progesterone receptor negative, HER-2 positive, with an MIB-1 of 31%             (b) on the left, ypT2 ypN1 invasive ductal carcinoma, grade 2, estrogen and progesterone receptor negative, HER-2 positive, with an MIB-1 of 35%.   01/2015 - 02/2015 Radiation Therapy   Adj XRT    - 03/2018 Chemotherapy   Ixampra     CHIEF COMPLIANT: Follow-up of  estrogen receptor negative, but HER2 amplified breast cancer, to establish oncology care with me  INTERVAL HISTORY: [name] is a 71 y.o. with above-mentioned history of estrogen receptor negative, but HER2 amplified breast  cancer. She presents to the clinic today for follow-up.   ALLERGIES:  is allergic to penicillins and adhesive [tape].  MEDICATIONS:  Current Outpatient Medications  Medication Sig Dispense Refill   acetaminophen (TYLENOL) 500 MG tablet Take 1,000 mg by mouth every 6 (six) hours as needed for mild pain or fever.      albuterol (PROVENTIL HFA;VENTOLIN HFA) 108 (90 BASE) MCG/ACT inhaler Inhale 2 puffs into the lungs every 6 (six) hours as needed for wheezing. 1 Inhaler 5   ALPRAZolam (XANAX) 1 MG tablet Take 1 tablet (1 mg total) by mouth as needed for anxiety. 30 tablet 1   amLODipine (NORVASC) 10 MG tablet Take 1 tablet (10 mg total) by mouth every morning. 30 tablet 4   baclofen (LIORESAL) 10 MG tablet TAKE 1 TABLET BY MOUTH THREE TIMES DAILY AS NEEDED FOR MUSCLE SPASMS 270 tablet 0   carvedilol (COREG) 12.5 MG tablet TAKE 1 TABLET(12.5 MG) BY MOUTH TWICE DAILY 60 tablet 11   diclofenac sodium (VOLTAREN) 1 % GEL Apply 2 g topically daily as needed (for pain). Apply to knees and shoulders 100 g 6   furosemide (LASIX) 40 MG tablet TAKE 1 TABLET(40 MG) BY MOUTH DAILY 30 tablet 3   gabapentin (NEURONTIN) 300 MG capsule TAKE 2 CAPSULES BY MOUTH THREE TIMES DAILY 540 capsule 0   losartan (COZAAR) 100 MG tablet Take 100 mg by mouth every morning.     oxyCODONE-acetaminophen (PERCOCET) 10-325 MG tablet Take 1 tablet by mouth 4 (four) times daily as needed.     potassium chloride SA (KLOR-CON) 20 MEQ  tablet Take 1 tablet (20 mEq total) by mouth daily as needed. With use of lasix 30 tablet 3   predniSONE (DELTASONE) 5 MG tablet Take 5 mg by mouth daily in the afternoon.     spironolactone (ALDACTONE) 25 MG tablet TAKE 1 TABLET(25 MG) BY MOUTH DAILY 30 tablet 11   temazepam (RESTORIL) 30 MG capsule Take 1 capsule (30 mg total) by mouth at bedtime as needed for sleep. 30 capsule 0   warfarin (COUMADIN) 2.5 MG tablet TAKE ONE TABLET BY MOUTH DAILY. TAKE WITH 5 MG PER MD INSTRUCTIONS. 30 tablet 3   warfarin  (COUMADIN) 5 MG tablet TAKE 1 TABLET BY MOUTH ON ODD NUMBERED DAYS PLUS 1 AND 1/2 TABLETS ON EVEN NUMBERED DAYS 90 tablet 4   No current facility-administered medications for this visit.   Facility-Administered Medications Ordered in Other Visits  Medication Dose Route Frequency Provider Last Rate Last Admin   sodium chloride flush (NS) 0.9 % injection 10 mL  10 mL Intravenous PRN Magrinat, Virgie Dad, MD   10 mL at 12/15/15 1200   sodium chloride flush (NS) 0.9 % injection 10 mL  10 mL Intracatheter PRN Magrinat, Virgie Dad, MD   10 mL at 08/14/18 1116   sodium chloride flush (NS) 0.9 % injection 10 mL  10 mL Intracatheter PRN Magrinat, Virgie Dad, MD        PHYSICAL EXAMINATION: ECOG PERFORMANCE STATUS: 1 - Symptomatic but completely ambulatory  Vitals:   09/14/21 1131  BP: (!) 155/72  Pulse: 88  Resp: 18  Temp: 97.9 F (36.6 C)  SpO2: 98%   Filed Weights   09/14/21 1131  Weight: 265 lb (120.2 kg)      LABORATORY DATA:  I have reviewed the data as listed CMP Latest Ref Rng & Units 08/17/2021 07/19/2021 06/22/2021  Glucose 70 - 99 mg/dL 110(H) 99 111(H)  BUN 8 - 23 mg/dL 19 25(H) 15  Creatinine 0.44 - 1.00 mg/dL 0.89 0.99 1.05(H)  Sodium 135 - 145 mmol/L 140 142 141  Potassium 3.5 - 5.1 mmol/L 4.4 4.0 4.1  Chloride 98 - 111 mmol/L 105 108 108  CO2 22 - 32 mmol/L _0 Calcium 8.9 - 10.3 mg/dL 9.6 8.8(L) 9.2  Total Protein 6.5 - 8.1 g/dL 7.6 7.1 7.6  Total Bilirubin 0.3 - 1.2 mg/dL 0.8 0.6 0.9  Alkaline Phos 38 - 126 U/L 72 68 73  AST 15 - 41 U/L 11(L) 11(L) 16  ALT 0 - 44 U/L _1 Lab Results  Component Value Date   WBC 9.8 09/14/2021   HGB 11.7 (L) 09/14/2021   HCT 33.3 (L) 09/14/2021   MCV 79.3 (L) 09/14/2021   PLT 281 09/14/2021   NEUTROABS 7.3 09/14/2021    ASSESSMENT & PLAN:  Breast cancer metastasized to multiple sites (liver and bone involvement in September 2004) Neoadj chemo [facility] X 6 completed April 2005 foll by herceptin  maintenance  bilateral mastectomies with bilateral axillary lymph node dissection 12/07/2004, showing             (a) on the right, a mypT1c ypN1 invasive ductal carcinoma, grade 3, estrogen and progesterone receptor negative, HER-2 positive, with an MIB-1 of 31%             (b) on the left, ypT2 ypN1 invasive ductal carcinoma, grade 2, estrogen and progesterone receptor negative, HER-2 positive, with an MIB-1 of 35%.  Adj XRT  Current treatment: Herceptin maintenance therapy Plan to  continue Herceptin indefinitely every 28 days.  Breast cancer Surveillance: CT chest 03/27/2021: Status post bilateral mastectomy, unchanged appearances subcutaneous soft tissue.  No evidence of metastatic breast cancer recurrence.  Subpleural radiation fibrosis upper lobes Plan is to obtain scans once a year.  SVC syndrome: On lifelong anticoagulation.  Patient's INR has been all over the place.  Therefore we decided to discontinue Coumadin and switch her to Xarelto.  RTC in year    No orders of the defined types were placed in this encounter.  The patient has a good understanding of the overall plan. she agrees with it. she will call with any problems that may develop before the next visit here.  Total time spent: 45 mins including face to face time and time spent for planning, charting and coordination of care  Rulon Eisenmenger, MD, MPH 09/14/2021  I, Thana Ates, am acting as scribe for Dr. Nicholas Lose.  I have reviewed the above documentation for accuracy and completeness, and I agree with the above.

## 2021-09-13 NOTE — Assessment & Plan Note (Signed)
Neoadj chemo [facility] X 6 foll by herceptin maintenance  bilateral mastectomieswith bilateral axillary lymph node dissection 12/07/2004, showing (a) on the right, a mypT1c ypN1 invasive ductal carcinoma, grade 3, estrogen and progesterone receptor negative, HER-2 positive, with an MIB-1 of 31% (b) on the left, ypT2 ypN1 invasive ductal carcinoma, grade 2, estrogen and progesterone receptor negative, HER-2 positive, with an MIB-1 of 35%.  Adj XRT Ixampra completed 03/2018  Breast cancer Surveillance: 1. Breast Exam: 09/14/20  RTC in year

## 2021-09-14 ENCOUNTER — Inpatient Hospital Stay: Payer: Medicare Other

## 2021-09-14 ENCOUNTER — Other Ambulatory Visit: Payer: Self-pay

## 2021-09-14 ENCOUNTER — Inpatient Hospital Stay: Payer: Medicare Other | Attending: Oncology

## 2021-09-14 ENCOUNTER — Other Ambulatory Visit: Payer: Self-pay | Admitting: *Deleted

## 2021-09-14 ENCOUNTER — Inpatient Hospital Stay (HOSPITAL_BASED_OUTPATIENT_CLINIC_OR_DEPARTMENT_OTHER): Payer: Medicare Other | Admitting: Hematology and Oncology

## 2021-09-14 DIAGNOSIS — D689 Coagulation defect, unspecified: Secondary | ICD-10-CM

## 2021-09-14 DIAGNOSIS — C50012 Malignant neoplasm of nipple and areola, left female breast: Secondary | ICD-10-CM

## 2021-09-14 DIAGNOSIS — Z5112 Encounter for antineoplastic immunotherapy: Secondary | ICD-10-CM | POA: Diagnosis not present

## 2021-09-14 DIAGNOSIS — Z171 Estrogen receptor negative status [ER-]: Secondary | ICD-10-CM | POA: Diagnosis not present

## 2021-09-14 DIAGNOSIS — C50812 Malignant neoplasm of overlapping sites of left female breast: Secondary | ICD-10-CM | POA: Insufficient documentation

## 2021-09-14 DIAGNOSIS — Z7901 Long term (current) use of anticoagulants: Secondary | ICD-10-CM | POA: Insufficient documentation

## 2021-09-14 DIAGNOSIS — C50911 Malignant neoplasm of unspecified site of right female breast: Secondary | ICD-10-CM

## 2021-09-14 DIAGNOSIS — I871 Compression of vein: Secondary | ICD-10-CM | POA: Insufficient documentation

## 2021-09-14 DIAGNOSIS — C50919 Malignant neoplasm of unspecified site of unspecified female breast: Secondary | ICD-10-CM

## 2021-09-14 DIAGNOSIS — C50011 Malignant neoplasm of nipple and areola, right female breast: Secondary | ICD-10-CM

## 2021-09-14 DIAGNOSIS — Z95828 Presence of other vascular implants and grafts: Secondary | ICD-10-CM

## 2021-09-14 LAB — CMP (CANCER CENTER ONLY)
ALT: 9 U/L (ref 0–44)
AST: 10 U/L — ABNORMAL LOW (ref 15–41)
Albumin: 4 g/dL (ref 3.5–5.0)
Alkaline Phosphatase: 59 U/L (ref 38–126)
Anion gap: 7 (ref 5–15)
BUN: 20 mg/dL (ref 8–23)
CO2: 26 mmol/L (ref 22–32)
Calcium: 9.2 mg/dL (ref 8.9–10.3)
Chloride: 107 mmol/L (ref 98–111)
Creatinine: 0.82 mg/dL (ref 0.44–1.00)
GFR, Estimated: >60 mL/min (ref >60)
Glucose, Bld: 114 mg/dL — ABNORMAL HIGH (ref 70–99)
Potassium: 4.2 mmol/L (ref 3.5–5.1)
Sodium: 140 mmol/L (ref 135–145)
Total Bilirubin: 0.8 mg/dL (ref 0.3–1.2)
Total Protein: 7.5 g/dL (ref 6.5–8.1)

## 2021-09-14 LAB — PROTIME-INR
INR: 1.5 — ABNORMAL HIGH (ref 0.8–1.2)
Prothrombin Time: 18.2 seconds — ABNORMAL HIGH (ref 11.4–15.2)

## 2021-09-14 LAB — CBC WITH DIFFERENTIAL (CANCER CENTER ONLY)
Abs Immature Granulocytes: 0.04 10*3/uL (ref 0.00–0.07)
Basophils Absolute: 0 10*3/uL (ref 0.0–0.1)
Basophils Relative: 0 %
Eosinophils Absolute: 0 10*3/uL (ref 0.0–0.5)
Eosinophils Relative: 0 %
HCT: 33.3 % — ABNORMAL LOW (ref 36.0–46.0)
Hemoglobin: 11.7 g/dL — ABNORMAL LOW (ref 12.0–15.0)
Immature Granulocytes: 0 %
Lymphocytes Relative: 19 %
Lymphs Abs: 1.9 10*3/uL (ref 0.7–4.0)
MCH: 27.9 pg (ref 26.0–34.0)
MCHC: 35.1 g/dL (ref 30.0–36.0)
MCV: 79.3 fL — ABNORMAL LOW (ref 80.0–100.0)
Monocytes Absolute: 0.6 10*3/uL (ref 0.1–1.0)
Monocytes Relative: 6 %
Neutro Abs: 7.3 10*3/uL (ref 1.7–7.7)
Neutrophils Relative %: 75 %
Platelet Count: 281 10*3/uL (ref 150–400)
RBC: 4.2 MIL/uL (ref 3.87–5.11)
RDW: 15.9 % — ABNORMAL HIGH (ref 11.5–15.5)
WBC Count: 9.8 10*3/uL (ref 4.0–10.5)
nRBC: 0 % (ref 0.0–0.2)

## 2021-09-14 MED ORDER — TRASTUZUMAB-DKST CHEMO 150 MG IV SOLR
750.0000 mg | Freq: Once | INTRAVENOUS | Status: AC
  Administered 2021-09-14: 750 mg via INTRAVENOUS
  Filled 2021-09-14: qty 35.72

## 2021-09-14 MED ORDER — LORAZEPAM 2 MG/ML IJ SOLN
1.0000 mg | Freq: Once | INTRAMUSCULAR | Status: AC | PRN
  Administered 2021-09-14: 1 mg via INTRAVENOUS
  Filled 2021-09-14: qty 1

## 2021-09-14 MED ORDER — SODIUM CHLORIDE 0.9 % IV SOLN: Freq: Once | INTRAVENOUS | Status: AC

## 2021-09-14 MED ORDER — ACETAMINOPHEN 325 MG PO TABS
650.0000 mg | ORAL_TABLET | Freq: Once | ORAL | Status: AC
  Administered 2021-09-14: 650 mg via ORAL
  Filled 2021-09-14: qty 2

## 2021-09-14 MED ORDER — RIVAROXABAN 20 MG PO TABS: 20.0000 mg | ORAL_TABLET | Freq: Every day | ORAL | 3 refills | Status: DC

## 2021-09-14 MED ORDER — SODIUM CHLORIDE 0.9% FLUSH
10.0000 mL | INTRAVENOUS | Status: DC | PRN
  Administered 2021-09-14: 10 mL via INTRAVENOUS

## 2021-09-14 MED ORDER — DIPHENHYDRAMINE HCL 25 MG PO CAPS
25.0000 mg | ORAL_CAPSULE | Freq: Once | ORAL | Status: AC
  Administered 2021-09-14: 25 mg via ORAL
  Filled 2021-09-14: qty 1

## 2021-09-14 NOTE — Patient Instructions (Signed)
[facility] MEDICAL [facility]COLOGY   Discharge Instructions: Thank you for choosing [facility] to provide your oncology and hematology care.   If you have a lab appointment with the Cancer Center, please go directly to the Cancer Center and check in at the registration area.   Wear comfortable clothing and clothing appropriate for easy access to any Portacath or PICC line.   We strive to give you quality time with your provider. You may need to reschedule your appointment if you arrive late (15 or more minutes).  Arriving late affects you and other patients whose appointments are after yours.  Also, if you miss three or more appointments without notifying the office, you may be dismissed from the clinic at the provider's discretion.      For prescription refill requests, have your pharmacy contact our office and allow 72 hours for refills to be completed.    Today you received the following chemotherapy and/or immunotherapy agents: trastuzumab-dkst.      To help prevent nausea and vomiting after your treatment, we encourage you to take your nausea medication as directed.  BELOW ARE SYMPTOMS THAT SHOULD BE REPORTED IMMEDIATELY: *FEVER GREATER THAN 100.4 F (38 C) OR HIGHER *CHILLS OR SWEATING *NAUSEA AND VOMITING THAT IS NOT CONTROLLED WITH YOUR NAUSEA MEDICATION *UNUSUAL SHORTNESS OF BREATH *UNUSUAL BRUISING OR BLEEDING *URINARY PROBLEMS (pain or burning when urinating, or frequent urination) *BOWEL PROBLEMS (unusual diarrhea, constipation, pain near the anus) TENDERNESS IN MOUTH AND THROAT WITH OR WITHOUT PRESENCE OF ULCERS (sore throat, sores in mouth, or a toothache) UNUSUAL RASH, SWELLING OR PAIN  UNUSUAL VAGINAL DISCHARGE OR ITCHING   Items with * indicate a potential emergency and should be followed up as soon as possible or go to the Emergency Department if any problems should occur.  Please show the CHEMOTHERAPY ALERT CARD or IMMUNOTHERAPY ALERT CARD at  check-in to the Emergency Department and triage nurse.  Should you have questions after your visit or need to cancel or reschedule your appointment, please contact [facility]  Dept: [phone]  and follow the prompts.  Office hours are 8:00 a.m. to 4:30 p.m. Monday - Friday. Please note that voicemails left after 4:00 p.m. may not be returned until the following business day.  We are closed weekends and major holidays. You have access to a nurse at all times for urgent questions. Please call the main number to the clinic Dept: [phone] and follow the prompts.   For any non-urgent questions, you may also contact your provider using MyChart. We now offer e-Visits for anyone 18 and older to request care online for non-urgent symptoms. For details visit [email or web address].   Also download the MyChart app! Go to the app store, search "MyChart", open the app, select , and log in with your MyChart username and password.  Due to Covid, a mask is required upon entering the hospital/clinic. If you do not have a mask, one will be given to you upon arrival. For doctor visits, patients may have 1 support person aged 18 or older with them. For treatment visits, patients cannot have anyone with them due to current Covid guidelines and our immunocompromised population.    

## 2021-09-15 ENCOUNTER — Other Ambulatory Visit: Payer: Self-pay | Admitting: *Deleted

## 2021-09-15 ENCOUNTER — Other Ambulatory Visit: Payer: Self-pay | Admitting: Hematology and Oncology

## 2021-09-15 ENCOUNTER — Other Ambulatory Visit: Payer: Self-pay

## 2021-09-15 MED ORDER — WARFARIN SODIUM 5 MG PO TABS: ORAL_TABLET | ORAL | 0 refills | Status: DC

## 2021-09-15 MED ORDER — RIVAROXABAN 20 MG PO TABS: 20.0000 mg | ORAL_TABLET | Freq: Every day | ORAL | 3 refills | Status: DC

## 2021-09-15 MED ORDER — ALPRAZOLAM 1 MG PO TABS: 1.0000 mg | ORAL_TABLET | ORAL | 1 refills | Status: DC | PRN

## 2021-09-15 MED ORDER — RIVAROXABAN (XARELTO) VTE STARTER PACK (15 & 20 MG): ORAL_TABLET | ORAL | 0 refills | Status: DC

## 2021-09-15 MED ORDER — WARFARIN SODIUM 2.5 MG PO TABS: ORAL_TABLET | ORAL | 0 refills | Status: DC

## 2021-09-15 MED ORDER — APIXABAN 5 MG PO TABS: 5.0000 mg | ORAL_TABLET | Freq: Two times a day (BID) | ORAL | 1 refills | Status: DC

## 2021-09-15 NOTE — Telephone Encounter (Signed)
Received call from pt pharmacy stating pt responsibility for Xarelto would be $400 a month.  Per MD pt to be prescribed Eliquis 5 mg p.o BID.  RN will contact pharmacy to see if this will be covered by insurance.

## 2021-09-17 ENCOUNTER — Other Ambulatory Visit: Payer: Self-pay | Admitting: Hematology and Oncology

## 2021-09-18 ENCOUNTER — Other Ambulatory Visit: Payer: Self-pay

## 2021-09-18 ENCOUNTER — Telehealth: Payer: Self-pay

## 2021-09-18 NOTE — Telephone Encounter (Signed)
Return call to pt, from weekend nurse access line, confirmed that pt did pick up her Eliquis and she states she can afford a 30 day supply at a time.  Per pt, pharmacists at [facility] instructed her to take warfarin and eliquis for 1 day and then discontinue warfarin thereafter.  Pt verbalized this to me over the phone and understands she should only take the eliquis.  I instructed pt to take meds as directed, consistently, and to keep follow up appointments for monitoring and obtaining future refills.  Pt verbalized thanks and understanding.    This nurse called [facility] in pt profile where warfarin rx was sent and instructed them to discontinue this med as pt is choosing to taking eliquis instead.

## 2021-09-19 ENCOUNTER — Telehealth: Payer: Self-pay

## 2021-09-19 DIAGNOSIS — C50919 Malignant neoplasm of unspecified site of unspecified female breast: Secondary | ICD-10-CM

## 2021-09-19 NOTE — Telephone Encounter (Signed)
Return call to pt, pt states she had lab work done at her PCP and had additional questions regarding positive ANA results.  I advised pt that after review with MD referral to rheumatology would be placed, pt verbalized agreement, understanding, and thanks.  I advised pt that it could be several weeks before they could get her in, pt verbalized understanding and thanks.

## 2021-09-28 ENCOUNTER — Other Ambulatory Visit: Payer: Self-pay

## 2021-09-28 DIAGNOSIS — C50919 Malignant neoplasm of unspecified site of unspecified female breast: Secondary | ICD-10-CM

## 2021-09-28 DIAGNOSIS — D689 Coagulation defect, unspecified: Secondary | ICD-10-CM

## 2021-09-29 ENCOUNTER — Other Ambulatory Visit: Payer: Self-pay

## 2021-09-29 ENCOUNTER — Inpatient Hospital Stay: Payer: Medicare Other | Attending: Oncology

## 2021-09-29 DIAGNOSIS — Z7901 Long term (current) use of anticoagulants: Secondary | ICD-10-CM | POA: Insufficient documentation

## 2021-09-29 DIAGNOSIS — Z5112 Encounter for antineoplastic immunotherapy: Secondary | ICD-10-CM | POA: Diagnosis not present

## 2021-09-29 DIAGNOSIS — C50812 Malignant neoplasm of overlapping sites of left female breast: Secondary | ICD-10-CM | POA: Insufficient documentation

## 2021-09-29 DIAGNOSIS — I871 Compression of vein: Secondary | ICD-10-CM | POA: Diagnosis not present

## 2021-09-29 DIAGNOSIS — D689 Coagulation defect, unspecified: Secondary | ICD-10-CM

## 2021-09-29 DIAGNOSIS — C50911 Malignant neoplasm of unspecified site of right female breast: Secondary | ICD-10-CM | POA: Diagnosis not present

## 2021-09-29 LAB — PROTIME-INR
INR: 1.2 (ref 0.8–1.2)
Prothrombin Time: 15.5 seconds — ABNORMAL HIGH (ref 11.4–15.2)

## 2021-10-12 ENCOUNTER — Encounter: Payer: Self-pay | Admitting: *Deleted

## 2021-10-12 ENCOUNTER — Inpatient Hospital Stay: Payer: Medicare Other

## 2021-10-12 ENCOUNTER — Other Ambulatory Visit: Payer: Self-pay | Admitting: Hematology and Oncology

## 2021-10-12 ENCOUNTER — Other Ambulatory Visit: Payer: Self-pay

## 2021-10-12 VITALS — BP 115/80 | HR 75 | Temp 98.7°F | Resp 18 | Ht 63.0 in | Wt 268.0 lb

## 2021-10-12 DIAGNOSIS — Z5112 Encounter for antineoplastic immunotherapy: Secondary | ICD-10-CM | POA: Diagnosis not present

## 2021-10-12 DIAGNOSIS — C50911 Malignant neoplasm of unspecified site of right female breast: Secondary | ICD-10-CM | POA: Diagnosis not present

## 2021-10-12 DIAGNOSIS — Z7901 Long term (current) use of anticoagulants: Secondary | ICD-10-CM | POA: Diagnosis not present

## 2021-10-12 DIAGNOSIS — Z95828 Presence of other vascular implants and grafts: Secondary | ICD-10-CM

## 2021-10-12 DIAGNOSIS — C50812 Malignant neoplasm of overlapping sites of left female breast: Secondary | ICD-10-CM | POA: Diagnosis not present

## 2021-10-12 DIAGNOSIS — C50919 Malignant neoplasm of unspecified site of unspecified female breast: Secondary | ICD-10-CM

## 2021-10-12 DIAGNOSIS — I871 Compression of vein: Secondary | ICD-10-CM | POA: Diagnosis not present

## 2021-10-12 LAB — CMP (CANCER CENTER ONLY)
ALT: 8 U/L (ref 0–44)
AST: 9 U/L — ABNORMAL LOW (ref 15–41)
Albumin: 3.9 g/dL (ref 3.5–5.0)
Alkaline Phosphatase: 62 U/L (ref 38–126)
Anion gap: 9 (ref 5–15)
BUN: 17 mg/dL (ref 8–23)
CO2: 26 mmol/L (ref 22–32)
Calcium: 9 mg/dL (ref 8.9–10.3)
Chloride: 106 mmol/L (ref 98–111)
Creatinine: 0.95 mg/dL (ref 0.44–1.00)
GFR, Estimated: >60 mL/min (ref >60)
Glucose, Bld: 159 mg/dL — ABNORMAL HIGH (ref 70–99)
Potassium: 3.9 mmol/L (ref 3.5–5.1)
Sodium: 141 mmol/L (ref 135–145)
Total Bilirubin: 0.5 mg/dL (ref 0.3–1.2)
Total Protein: 7.2 g/dL (ref 6.5–8.1)

## 2021-10-12 LAB — CBC WITH DIFFERENTIAL (CANCER CENTER ONLY)
Abs Immature Granulocytes: 0.04 10*3/uL (ref 0.00–0.07)
Basophils Absolute: 0.1 10*3/uL (ref 0.0–0.1)
Basophils Relative: 1 %
Eosinophils Absolute: 0 10*3/uL (ref 0.0–0.5)
Eosinophils Relative: 0 %
HCT: 31.1 % — ABNORMAL LOW (ref 36.0–46.0)
Hemoglobin: 11 g/dL — ABNORMAL LOW (ref 12.0–15.0)
Immature Granulocytes: 0 %
Lymphocytes Relative: 17 %
Lymphs Abs: 1.6 10*3/uL (ref 0.7–4.0)
MCH: 28.7 pg (ref 26.0–34.0)
MCHC: 35.4 g/dL (ref 30.0–36.0)
MCV: 81.2 fL (ref 80.0–100.0)
Monocytes Absolute: 0.5 10*3/uL (ref 0.1–1.0)
Monocytes Relative: 5 %
Neutro Abs: 7.5 10*3/uL (ref 1.7–7.7)
Neutrophils Relative %: 77 %
Platelet Count: 271 10*3/uL (ref 150–400)
RBC: 3.83 MIL/uL — ABNORMAL LOW (ref 3.87–5.11)
RDW: 15.9 % — ABNORMAL HIGH (ref 11.5–15.5)
WBC Count: 9.7 10*3/uL (ref 4.0–10.5)
nRBC: 0 % (ref 0.0–0.2)

## 2021-10-12 MED ORDER — SODIUM CHLORIDE 0.9% FLUSH
10.0000 mL | INTRAVENOUS | Status: DC | PRN
  Administered 2021-10-12: 10 mL via INTRAVENOUS

## 2021-10-12 MED ORDER — SODIUM CHLORIDE 0.9 % IV SOLN: Freq: Once | INTRAVENOUS | Status: AC

## 2021-10-12 MED ORDER — HEPARIN SOD (PORK) LOCK FLUSH 100 UNIT/ML IV SOLN
500.0000 [IU] | Freq: Once | INTRAVENOUS | Status: AC | PRN
  Administered 2021-10-12: 500 [IU]

## 2021-10-12 MED ORDER — ACETAMINOPHEN 325 MG PO TABS
650.0000 mg | ORAL_TABLET | Freq: Once | ORAL | Status: AC
  Administered 2021-10-12: 650 mg via ORAL
  Filled 2021-10-12: qty 2

## 2021-10-12 MED ORDER — DIPHENHYDRAMINE HCL 25 MG PO CAPS
25.0000 mg | ORAL_CAPSULE | Freq: Once | ORAL | Status: AC
  Administered 2021-10-12: 25 mg via ORAL
  Filled 2021-10-12: qty 1

## 2021-10-12 MED ORDER — TRASTUZUMAB-DKST CHEMO 150 MG IV SOLR
750.0000 mg | Freq: Once | INTRAVENOUS | Status: AC
  Administered 2021-10-12: 750 mg via INTRAVENOUS
  Filled 2021-10-12: qty 35.72

## 2021-10-12 MED ORDER — SODIUM CHLORIDE 0.9% FLUSH
10.0000 mL | INTRAVENOUS | Status: DC | PRN
  Administered 2021-10-12: 10 mL

## 2021-10-12 MED ORDER — LORAZEPAM 2 MG/ML IJ SOLN
1.0000 mg | Freq: Once | INTRAMUSCULAR | Status: AC | PRN
  Administered 2021-10-12: 1 mg via INTRAVENOUS
  Filled 2021-10-12: qty 1

## 2021-10-12 NOTE — Progress Notes (Signed)
Pt due for echocardiogram.  Pt f/u with Dr. Aundra Dubin to receive echo's.  RN placed call to MD office and LVM to schedule pt to see Dr. Aundra Dubin and receive her echo.  RN educated pt to contact the office as well to schedule appt.  Per MD okay to treat today with last echo 05/10/22.

## 2021-10-12 NOTE — Patient Instructions (Signed)
[facility]COLOG[facility]   Discharge Instructions: Thank you for choosing [facility] to provide your oncology and hematology care.   If you have a lab appointment with the Cancer Center, please go directly to the Cancer Center and check in at the registration area.   Wear comfortable clothing and clothing appropriate for easy access to any Portacath or PICC line.   We strive to give you quality time with your provider. You may need to reschedule your appointment if you arrive late (15 or more minutes).  Arriving late affects you and other patients whose appointments are after yours.  Also, if you miss three or more appointments without notifying the office, you may be dismissed from the clinic at the provider's discretion.      For prescription refill requests, have your pharmacy contact our office and allow 72 hours for refills to be completed.    Today you received the following chemotherapy and/or immunotherapy agents: trastuzumab-dkst.      To help prevent nausea and vomiting after your treatment, we encourage you to take your nausea medication as directed.  BELOW ARE SYMPTOMS THAT SHOULD BE REPORTED IMMEDIATELY: *FEVER GREATER THAN 100.4 F (38 C) OR HIGHER *CHILLS OR SWEATING *NAUSEA AND VOMITING THAT IS NOT CONTROLLED WITH YOUR NAUSEA MEDICATION *UNUSUAL SHORTNESS OF BREATH *UNUSUAL BRUISING OR BLEEDING *URINARY PROBLEMS (pain or burning when urinating, or frequent urination) *BOWEL PROBLEMS (unusual diarrhea, constipation, pain near the anus) TENDERNESS IN MOUTH AND THROAT WITH OR WITHOUT PRESENCE OF ULCERS (sore throat, sores in mouth, or a toothache) UNUSUAL RASH, SWELLING OR PAIN  UNUSUAL VAGINAL DISCHARGE OR ITCHING   Items with * indicate a potential emergency and should be followed up as soon as possible or go to the Emergency Department if any problems should occur.  Please show the CHEMOTHERAPY ALERT CARD or IMMUNOTHERAPY ALERT CARD at  check-in to the Emergency Department and triage nurse.  Should you have questions after your visit or need to cancel or reschedule your appointment, please contact [facility]COL[facility]  Dept: [phone]  and follow the prompts.  Office hours are 8:00 a.m. to 4:30 p.m. Monday - Friday. Please note that voicemails left after 4:00 p.m. may not be returned until the following business day.  We are closed weekends and major holidays. You have access to a nurse at all times for urgent questions. Please call the main number to the clinic Dept: [phone] and follow the prompts.   For any non-urgent questions, you may also contact your provider using MyChart. We now offer e-Visits for anyone 18 and older to request care online for non-urgent symptoms. For details visit [email or web address].   Also download the MyChart app! Go to the app store, search "MyChart", open the app, select , and log in with your MyChart username and password.  Due to Covid, a mask is required upon entering the hospital/clinic. If you do not have a mask, one will be given to you upon arrival. For doctor visits, patients may have 1 support person aged 18 or older with them. For treatment visits, patients cannot have anyone with them due to current Covid guidelines and our immunocompromised population.    

## 2021-10-17 ENCOUNTER — Other Ambulatory Visit: Payer: Self-pay

## 2021-10-17 ENCOUNTER — Telehealth: Payer: Self-pay

## 2021-10-17 DIAGNOSIS — D689 Coagulation defect, unspecified: Secondary | ICD-10-CM

## 2021-10-17 NOTE — Telephone Encounter (Signed)
Incoming call from pt, pt expressed concerns with cost of monthly Eliquis ($400).  I informed pt that we would need to restart warfarin if she could not continue with Eliquis.  Pt states she took her last Eliquis today and would begin warfarin tomorrow, which she has at home, pt states she understands she will need labwork done every 2 weeks.  I instructed pt to follow directions from prior Rx alternating dosing.  I have faxed an order to pt PCP for lab draws every 2 weeks with results being faxed to [place].  Pt verbalized understanding by repeating the above back to me.

## 2021-10-17 NOTE — Telephone Encounter (Signed)
Return call to pt, left VM for pt regarding eliquis Rx cost and needing to change her back to warfarin.  Requested pt return my call to review in more details.

## 2021-10-23 NOTE — Progress Notes (Signed)
Called pt PCP office to confirm date/time for lab draw appointments for PT INR.  Office says they necver received the fax we have documented as successfully sent on 2/21 therefore I faxed again, successfully, to [phone]

## 2021-10-25 NOTE — Progress Notes (Signed)
Called Dr. Grover Canavan office and they confirmed they received fax to draw PT INR every 2 weeks.  They are calling pt to schedule appointment for early next week.   ?

## 2021-10-30 DIAGNOSIS — Z7901 Long term (current) use of anticoagulants: Secondary | ICD-10-CM | POA: Diagnosis not present

## 2021-11-09 ENCOUNTER — Inpatient Hospital Stay: Payer: Medicare Other | Attending: Oncology

## 2021-11-09 ENCOUNTER — Inpatient Hospital Stay: Payer: Medicare Other

## 2021-11-09 VITALS — BP 114/78 | HR 72 | Temp 98.7°F | Resp 18 | Wt 268.0 lb

## 2021-11-09 DIAGNOSIS — C50911 Malignant neoplasm of unspecified site of right female breast: Secondary | ICD-10-CM | POA: Insufficient documentation

## 2021-11-09 DIAGNOSIS — Z5112 Encounter for antineoplastic immunotherapy: Secondary | ICD-10-CM | POA: Diagnosis not present

## 2021-11-09 DIAGNOSIS — C50812 Malignant neoplasm of overlapping sites of left female breast: Secondary | ICD-10-CM | POA: Insufficient documentation

## 2021-11-09 LAB — CBC WITH DIFFERENTIAL (CANCER CENTER ONLY)
Abs Immature Granulocytes: 0.03 10*3/uL (ref 0.00–0.07)
Basophils Absolute: 0 10*3/uL (ref 0.0–0.1)
Basophils Relative: 1 %
Eosinophils Absolute: 0.2 10*3/uL (ref 0.0–0.5)
Eosinophils Relative: 3 %
HCT: 32 % — ABNORMAL LOW (ref 36.0–46.0)
Hemoglobin: 11 g/dL — ABNORMAL LOW (ref 12.0–15.0)
Immature Granulocytes: 0 %
Lymphocytes Relative: 34 %
Lymphs Abs: 2.8 10*3/uL (ref 0.7–4.0)
MCH: 27.7 pg (ref 26.0–34.0)
MCHC: 34.4 g/dL (ref 30.0–36.0)
MCV: 80.6 fL (ref 80.0–100.0)
Monocytes Absolute: 0.8 10*3/uL (ref 0.1–1.0)
Monocytes Relative: 9 %
Neutro Abs: 4.4 10*3/uL (ref 1.7–7.7)
Neutrophils Relative %: 53 %
Platelet Count: 235 10*3/uL (ref 150–400)
RBC: 3.97 MIL/uL (ref 3.87–5.11)
RDW: 15.3 % (ref 11.5–15.5)
WBC Count: 8.2 10*3/uL (ref 4.0–10.5)
nRBC: 0 % (ref 0.0–0.2)

## 2021-11-09 LAB — CMP (CANCER CENTER ONLY)
ALT: 7 U/L (ref 0–44)
AST: 10 U/L — ABNORMAL LOW (ref 15–41)
Albumin: 3.8 g/dL (ref 3.5–5.0)
Alkaline Phosphatase: 63 U/L (ref 38–126)
Anion gap: 6 (ref 5–15)
BUN: 19 mg/dL (ref 8–23)
CO2: 29 mmol/L (ref 22–32)
Calcium: 9.6 mg/dL (ref 8.9–10.3)
Chloride: 107 mmol/L (ref 98–111)
Creatinine: 0.85 mg/dL (ref 0.44–1.00)
GFR, Estimated: >60 mL/min (ref >60)
Glucose, Bld: 89 mg/dL (ref 70–99)
Potassium: 3.6 mmol/L (ref 3.5–5.1)
Sodium: 142 mmol/L (ref 135–145)
Total Bilirubin: 0.7 mg/dL (ref 0.3–1.2)
Total Protein: 7.3 g/dL (ref 6.5–8.1)

## 2021-11-09 MED ORDER — TRASTUZUMAB-DKST CHEMO 150 MG IV SOLR
750.0000 mg | Freq: Once | INTRAVENOUS | Status: AC
  Administered 2021-11-09: 750 mg via INTRAVENOUS
  Filled 2021-11-09: qty 35.72

## 2021-11-09 MED ORDER — SODIUM CHLORIDE 0.9 % IV SOLN: Freq: Once | INTRAVENOUS | Status: AC

## 2021-11-09 MED ORDER — ACETAMINOPHEN 325 MG PO TABS
650.0000 mg | ORAL_TABLET | Freq: Once | ORAL | Status: AC
  Administered 2021-11-09: 650 mg via ORAL
  Filled 2021-11-09: qty 2

## 2021-11-09 MED ORDER — SODIUM CHLORIDE 0.9% FLUSH
10.0000 mL | INTRAVENOUS | Status: AC | PRN
  Administered 2021-11-09: 10 mL

## 2021-11-09 MED ORDER — DIPHENHYDRAMINE HCL 25 MG PO CAPS
25.0000 mg | ORAL_CAPSULE | Freq: Once | ORAL | Status: AC
  Administered 2021-11-09: 25 mg via ORAL
  Filled 2021-11-09: qty 1

## 2021-11-09 MED ORDER — LORAZEPAM 2 MG/ML IJ SOLN
1.0000 mg | Freq: Once | INTRAMUSCULAR | Status: AC | PRN
  Administered 2021-11-09: 1 mg via INTRAVENOUS
  Filled 2021-11-09: qty 1

## 2021-11-09 NOTE — Patient Instructions (Signed)
[facility]COLOG[facility]   Discharge Instructions: Thank you for choosing [facility] to provide your oncology and hematology care.   If you have a lab appointment with the Cancer Center, please go directly to the Cancer Center and check in at the registration area.   Wear comfortable clothing and clothing appropriate for easy access to any Portacath or PICC line.   We strive to give you quality time with your provider. You may need to reschedule your appointment if you arrive late (15 or more minutes).  Arriving late affects you and other patients whose appointments are after yours.  Also, if you miss three or more appointments without notifying the office, you may be dismissed from the clinic at the provider's discretion.      For prescription refill requests, have your pharmacy contact our office and allow 72 hours for refills to be completed.    Today you received the following chemotherapy and/or immunotherapy agents: trastuzumab-dkst.      To help prevent nausea and vomiting after your treatment, we encourage you to take your nausea medication as directed.  BELOW ARE SYMPTOMS THAT SHOULD BE REPORTED IMMEDIATELY: *FEVER GREATER THAN 100.4 F (38 C) OR HIGHER *CHILLS OR SWEATING *NAUSEA AND VOMITING THAT IS NOT CONTROLLED WITH YOUR NAUSEA MEDICATION *UNUSUAL SHORTNESS OF BREATH *UNUSUAL BRUISING OR BLEEDING *URINARY PROBLEMS (pain or burning when urinating, or frequent urination) *BOWEL PROBLEMS (unusual diarrhea, constipation, pain near the anus) TENDERNESS IN MOUTH AND THROAT WITH OR WITHOUT PRESENCE OF ULCERS (sore throat, sores in mouth, or a toothache) UNUSUAL RASH, SWELLING OR PAIN  UNUSUAL VAGINAL DISCHARGE OR ITCHING   Items with * indicate a potential emergency and should be followed up as soon as possible or go to the Emergency Department if any problems should occur.  Please show the CHEMOTHERAPY ALERT CARD or IMMUNOTHERAPY ALERT CARD at  check-in to the Emergency Department and triage nurse.  Should you have questions after your visit or need to cancel or reschedule your appointment, please contact [facility]  Dept: [phone]  and follow the prompts.  Office hours are 8:00 a.m. to 4:30 p.m. Monday - Friday. Please note that voicemails left after 4:00 p.m. may not be returned until the following business day.  We are closed weekends and major holidays. You have access to a nurse at all times for urgent questions. Please call the main number to the clinic Dept: [phone] and follow the prompts.   For any non-urgent questions, you may also contact your provider using MyChart. We now offer e-Visits for anyone 18 and older to request care online for non-urgent symptoms. For details visit [email or web address].   Also download the MyChart app! Go to the app store, search "MyChart", open the app, select [place], and log in with your MyChart username and password.  Due to Covid, a mask is required upon entering the hospital/clinic. If you do not have a mask, one will be given to you upon arrival. For doctor visits, patients may have 1 support person aged 18 or older with them. For treatment visits, patients cannot have anyone with them due to current Covid guidelines and our immunocompromised population.    

## 2021-11-09 NOTE — Progress Notes (Signed)
Ok to treat today with ECHO from 9/14 per Dr. Lindi Adie.  ?

## 2021-11-13 ENCOUNTER — Other Ambulatory Visit: Payer: Self-pay | Admitting: Hematology and Oncology

## 2021-11-13 DIAGNOSIS — Z7901 Long term (current) use of anticoagulants: Secondary | ICD-10-CM | POA: Diagnosis not present

## 2021-11-13 MED ORDER — ALPRAZOLAM 1 MG PO TABS
1.0000 mg | ORAL_TABLET | ORAL | 1 refills | Status: DC | PRN
Start: 2021-11-13 — End: 2021-11-13

## 2021-11-13 MED ORDER — ALPRAZOLAM 1 MG PO TABS: 1.0000 mg | ORAL_TABLET | ORAL | 1 refills | Status: DC | PRN

## 2021-11-13 MED ORDER — TEMAZEPAM 30 MG PO CAPS
ORAL_CAPSULE | ORAL | 0 refills | Status: DC
Start: 2021-11-13 — End: 2021-11-13

## 2021-11-13 MED ORDER — TEMAZEPAM 30 MG PO CAPS: ORAL_CAPSULE | ORAL | 0 refills | Status: DC

## 2021-11-15 ENCOUNTER — Other Ambulatory Visit (HOSPITAL_COMMUNITY): Payer: Self-pay | Admitting: Cardiology

## 2021-11-16 ENCOUNTER — Telehealth: Payer: Self-pay

## 2021-11-16 NOTE — Telephone Encounter (Signed)
Called pt to review lab work, INR is 1.2.  Per Dr Lindi Adie we will adjust her dose.  I instructed pt to take higher dose 4 days per week and lower dose 3 days per week and not to consume dark greens, pt verbalized understanding..the patient is to take 7.'5mg'$  x 4 days and 2.'5mg'$  x 3 days.  Recheck INR as ordered, q2wks.   ?

## 2021-11-24 DIAGNOSIS — I10 Essential (primary) hypertension: Secondary | ICD-10-CM | POA: Diagnosis not present

## 2021-11-24 DIAGNOSIS — F419 Anxiety disorder, unspecified: Secondary | ICD-10-CM | POA: Diagnosis not present

## 2021-11-28 ENCOUNTER — Other Ambulatory Visit: Payer: Self-pay

## 2021-11-28 DIAGNOSIS — D689 Coagulation defect, unspecified: Secondary | ICD-10-CM

## 2021-11-28 NOTE — Progress Notes (Signed)
Faxed lab orders to PCP office per their request, successful transmission [phone] ?

## 2021-11-29 DIAGNOSIS — Z7901 Long term (current) use of anticoagulants: Secondary | ICD-10-CM | POA: Diagnosis not present

## 2021-11-30 LAB — PROTIME-INR: INR: 4.8 — AB (ref 0.80–1.20)

## 2021-11-30 NOTE — Progress Notes (Signed)
? ?Patient Care Team: ?Ernestene Kiel, MD as PCP - General (Internal Medicine) ?Haze Rushing., MD as Consulting Physician (Pain Medicine) ?Joya Salm, MD as Referring Physician (Orthopedic Surgery) ?Jaymes Graff, DO as Consulting Physician (Orthopedic Surgery) ?Larey Dresser, MD as Consulting Physician (Cardiology) ?Bensimhon, Shaune Pascal, MD as Consulting Physician (Cardiology) ?Lorelle Gibbs, MD (Radiology) ?Tommy Medal, Lavell Islam, MD as Consulting Physician (Infectious Diseases) ?Gery Pray, MD as Consulting Physician (Radiation Oncology) ?Nicholas Lose, MD as Consulting Physician (Hematology and Oncology) ? ?DIAGNOSIS:  ?Encounter Diagnosis  ?Name Primary?  ? Carcinoma of right breast metastatic to multiple sites [facility]) Yes  ? ? ?SUMMARY OF ONCOLOGIC HISTORY: ?Oncology History  ?Breast cancer metastasized to multiple sites [facility])  ?02/26/2013 Initial Diagnosis  ? history of inflammatory right breast cancer metastatic at presentation September 2004 with involvement of liver and bone, HER-2 positive, estrogen and progesterone receptor negative ?  ?  ? - 11/2013 Chemotherapy  ? carboplatin, docetaxel and Herceptin x 6 completed April 2005  ?  ? Surgery  ? bilateral mastectomies with bilateral axillary lymph node dissection 12/07/2004, showing ?            (a) on the right, a mypT1c ypN1 invasive ductal carcinoma, grade 3, estrogen and progesterone receptor negative, HER-2 positive, with an MIB-1 of 31% ?            (b) on the left, ypT2 ypN1 invasive ductal carcinoma, grade 2, estrogen and progesterone receptor negative, HER-2 positive, with an MIB-1 of 35%. ?  ?01/2015 - 02/2015 Radiation Therapy  ? Adj XRT ?  ? - 03/2018 Chemotherapy  ? Ixampra ?  ? ? ?CHIEF COMPLIANT: Follow-up of  estrogen receptor negative, but HER2 amplified breast cancer on coumadin and  herceptin maintenance ? ?INTERVAL HISTORY: [name] is a ? 72 y.o. with above-mentioned history of estrogen receptor negative, but HER2  amplified breast cancer. She presents to the clinic today for follow-up. She tolerating the herceptin. Denies no diarrhea and nausea. She complains of joint pain. She complains of her mobility has been affected. She complains of fatigue. ? ?ALLERGIES:  is allergic to penicillins and adhesive [tape]. ? ?MEDICATIONS:  ?Current Outpatient Medications  ?Medication Sig Dispense Refill  ? acetaminophen (TYLENOL) 500 MG tablet Take 1,000 mg by mouth every 6 (six) hours as needed for mild pain or fever.     ? albuterol (PROVENTIL HFA;VENTOLIN HFA) 108 (90 BASE) MCG/ACT inhaler Inhale 2 puffs into the lungs every 6 (six) hours as needed for wheezing. 1 Inhaler 5  ? ALPRAZolam (XANAX) 1 MG tablet Take 1 tablet (1 mg total) by mouth as needed for anxiety. 30 tablet 1  ? amLODipine (NORVASC) 10 MG tablet Take 1 tablet (10 mg total) by mouth every morning. 30 tablet 4  ? baclofen (LIORESAL) 10 MG tablet TAKE 1 TABLET BY MOUTH THREE TIMES DAILY AS NEEDED FOR MUSCLE SPASMS 270 tablet 0  ? carvedilol (COREG) 12.5 MG tablet TAKE 1 TABLET(12.5 MG) BY MOUTH TWICE DAILY 60 tablet 11  ? diclofenac sodium (VOLTAREN) 1 % GEL Apply 2 g topically daily as needed (for pain). Apply to knees and shoulders 100 g 6  ? furosemide (LASIX) 40 MG tablet TAKE 1 TABLET(40 MG) BY MOUTH DAILY 30 tablet 3  ? gabapentin (NEURONTIN) 300 MG capsule TAKE 2 CAPSULES BY MOUTH THREE TIMES DAILY 540 capsule 0  ? losartan (COZAAR) 100 MG tablet Take 100 mg by mouth every morning.    ? oxyCODONE-acetaminophen (PERCOCET) 10-325  MG tablet Take 1 tablet by mouth 4 (four) times daily as needed.    ? potassium chloride SA (KLOR-CON) 20 MEQ tablet Take 1 tablet (20 mEq total) by mouth daily as needed. With use of lasix 30 tablet 3  ? predniSONE (DELTASONE) 5 MG tablet Take 5 mg by mouth daily in the afternoon.    ? spironolactone (ALDACTONE) 25 MG tablet TAKE 1 TABLET(25 MG) BY MOUTH DAILY 30 tablet 11  ? temazepam (RESTORIL) 30 MG capsule TAKE 1 CAPSULE(30 MG) BY MOUTH  AT BEDTIME AS NEEDED FOR SLEEP 30 capsule 0  ? ?No current facility-administered medications for this visit.  ? ?Facility-Administered Medications Ordered in Other Visits  ?Medication Dose Route Frequency Provider Last Rate Last Admin  ? heparin lock flush 100 unit/mL  500 Units Intracatheter Once PRN Nicholas Lose, MD      ? LORazepam (ATIVAN) injection 1 mg  1 mg Intravenous Once PRN Nicholas Lose, MD      ? sodium chloride flush (NS) 0.9 % injection 10 mL  10 mL Intravenous PRN Magrinat, Virgie Dad, MD   10 mL at 12/15/15 1200  ? sodium chloride flush (NS) 0.9 % injection 10 mL  10 mL Intracatheter PRN Magrinat, Virgie Dad, MD   10 mL at 08/14/18 1116  ? sodium chloride flush (NS) 0.9 % injection 10 mL  10 mL Intracatheter PRN Magrinat, Virgie Dad, MD      ? sodium chloride flush (NS) 0.9 % injection 10 mL  10 mL Intracatheter PRN Nicholas Lose, MD      ? trastuzumab-dkst (OGIVRI) 750 mg in sodium chloride 0.9 % 250 mL chemo infusion  750 mg Intravenous Once Nicholas Lose, MD      ? ? ?PHYSICAL EXAMINATION: ?ECOG PERFORMANCE STATUS: 1 - Symptomatic but completely ambulatory ? ?Vitals:  ? 12/14/21 0921  ?BP: (!) 164/82  ?Pulse: 82  ?Resp: 18  ?Temp: 97.8 ?F (36.6 ?C)  ?SpO2: 96%  ? ?Filed Weights  ? 12/14/21 0921  ?Weight: 254 lb 1.6 oz (115.3 kg)  ? ?  ? ?LABORATORY DATA:  ?I have reviewed the data as listed ? ?  Latest Ref Rng & Units 12/14/2021  ?  9:12 AM 11/09/2021  ? 10:14 AM 10/12/2021  ? 12:05 PM  ?CMP  ?Glucose 70 - 99 mg/dL 90   89   159    ?BUN 8 - 23 mg/dL _0 ?Creatinine 0.44 - 1.00 mg/dL 1.24   0.85   0.95    ?Sodium 135 - 145 mmol/L 142   142   141    ?Potassium 3.5 - 5.1 mmol/L 3.8   3.6   3.9    ?Chloride 98 - 111 mmol/L 103   107   106    ?CO2 22 - 32 mmol/L _1 ?Calcium 8.9 - 10.3 mg/dL 8.9   9.6   9.0    ?Total Protein 6.5 - 8.1 g/dL 7.4   7.3   7.2    ?Total Bilirubin 0.3 - 1.2 mg/dL 0.6   0.7   0.5    ?Alkaline Phos 38 - 126 U/L 65   63   62    ?AST 15 - 41 U/L _2 ?ALT 0 - 44 U/L _3 ? ? ?Lab Results  ?Component Value Date  ?  WBC 9.6 12/14/2021  ? HGB 11.1 (L) 12/14/2021  ? HCT 31.8 (L) 12/14/2021  ? MCV 79.1 (L) 12/14/2021  ? PLT 332 12/14/2021  ? NEUTROABS 5.8 12/14/2021  ? ? ?ASSESSMENT & PLAN:  ?Breast cancer metastasized to multiple sites ?2004: Liver. Lung and bone mets ?Neoadj chemo TCH X 6 completed April 2005 foll by herceptin maintenance ?  ?bilateral mastectomies with bilateral axillary lymph node dissection 12/07/2004, showing ?            (a) on the right, a mypT1c ypN1 invasive ductal carcinoma, grade 3, estrogen and progesterone receptor negative, HER-2 positive, with an MIB-1 of 31% ?            (b) on the left, ypT2 ypN1 invasive ductal carcinoma, grade 2, estrogen and progesterone receptor negative, HER-2 positive, with an MIB-1 of 35%. ?  ?Adj XRT ?  ?Current treatment: Herceptin maintenance therapy ?Plan to continue Herceptin indefinitely every 28 days. ?  ?Breast cancer Surveillance: ?CT chest 03/27/2021: Status post bilateral mastectomy, unchanged appearances subcutaneous soft tissue.  No evidence of metastatic breast cancer recurrence.  Subpleural radiation fibrosis upper lobes ?Plan is to obtain scans once a year. ?  ?SVC syndrome: On lifelong anticoagulation.  Currently on Coumadin because the Xarelto was not covered by her insurance.  Today's INR is 4.6 and therefore she will hold anticoagulation for 2 days and then resume it at 5 mg daily. ? ?Joint stiffness and achiness: Related to osteoarthritis along with menopausal symptoms: I discussed with her about stretching and yoga for exercise. ?  ?We will obtain scans in August and follow-up after that.  She will come monthly for Herceptin infusions. ? ? ? ?Orders Placed This Encounter  ?Procedures  ? CT CHEST ABDOMEN PELVIS W CONTRAST  ?  Standing Status:   Future  ?  Standing Expiration Date:   12/15/2022  ?  Order Specific Question:   Preferred imaging location?  ?  Answer:   [facility]  ?  Order Specific Question:   Release to patient  ?  Answer:   Immediate  ?  Order Specific Question:   Is Oral Contrast requested for this exam?  ?  Answer:   Yes, Per Radiology protocol  ? NM B

## 2021-12-05 ENCOUNTER — Telehealth: Payer: Self-pay

## 2021-12-05 NOTE — Telephone Encounter (Signed)
Reviewed pt INR with Dr Chryl Heck in the absence of Dr Lindi Adie.  Per MD pt to hold coumadin for 2 days and resume therapy alternating doses of 7.'5mg'$  and 2.'5mg'$  every other day due to current INR of 4.8 ? ?Called and spoke with pt, INR elevated.  Pt states she has significantly decreased her intake of dark green veggies and has been taking the previous dosing correctly.  Pt is not actively bleeding.  Pt instructed on the above, hold 2 days of coumadin and then resume with alternating doses, 7.'5mg'$  and 2.'5mg'$ .  Pt to have labs re checked in 1 week.  Pt verbalized understanding and thanks  ?

## 2021-12-07 ENCOUNTER — Ambulatory Visit: Payer: Medicare Other

## 2021-12-07 ENCOUNTER — Other Ambulatory Visit: Payer: Medicare Other

## 2021-12-07 ENCOUNTER — Ambulatory Visit: Payer: Medicare Other | Admitting: Adult Health

## 2021-12-07 NOTE — Progress Notes (Signed)
? ?Office Visit Note ? ?Patient: [name]             ?Date of Birth: 05-Sep-1950           ?MRN: [id number]             ?PCP: Ernestene Kiel, MD ?Referring: Nicholas Lose, MD ?Visit Date: 12/19/2021 ?Occupation: '@GUAROCC'$ @ ? ?Subjective:  ?Pain in multiple joints and abnormal labs ? ?History of Present Illness: [name] is a 71 y.o. female seen in consultation per request of Dr. Lindi Adie.  According the patient she has had joint pain for the last few years.  She recalls pain in her knee joints at least for 5 years.  She notices intermittent swelling in her ankles and her knee joints.  She states that she was diagnosed with breast cancer in 2004 after that she had bilateral mastectomy followed by chemotherapy and radiation therapy.  She had a routine PET scan which showed right shoulder joint nondisplaced fracture which did not require surgery in 2018.  She has had bilateral shoulder joint discomfort since then.  She also gives history of discomfort in her hands with a stiffness.  She has a stiffness in her knee joints.  Patient states that she gets cortisone injection to her knee joints by orthopedic surgeons and her PCP in [place].  She has chronic neck and lower back pain.  She complains of discomfort to Dr. Lindi Adie.  She had labs done in [place] which showed positive ANA, positive double-stranded DNA and positive anti-CCP antibodies.  She was referred to me for further evaluation.  She denies any history of oral ulcers, nasal ulcers, sicca symptoms, Raynaud's phenomenon, lymphadenopathy, photosensitivity.  There is no family history of rheumatoid arthritis or lupus.  She is gravida 3, para 3.  There is no history of preeclampsia or premature birth. ? ?Activities of Daily Living:  ?Patient reports morning stiffness for all day. ?Patient Reports nocturnal pain.  ?Difficulty dressing/grooming: Denies ?Difficulty climbing stairs: Reports ?Difficulty getting out of chair: Reports ?Difficulty using hands  for taps, buttons, cutlery, and/or writing: Reports ? ?Review of Systems  ?Constitutional:  Positive for fatigue.  ?HENT:  Negative for mouth sores, mouth dryness and nose dryness.   ?Eyes:  Negative for pain, itching and dryness.  ?Respiratory:  Positive for shortness of breath. Negative for difficulty breathing.   ?     With activity   ?Cardiovascular:  Positive for palpitations. Negative for chest pain.  ?Gastrointestinal:  Negative for blood in stool, constipation and diarrhea.  ?Endocrine: Positive for cold intolerance. Negative for increased urination.  ?Genitourinary:  Positive for involuntary urination. Negative for difficulty urinating.  ?Musculoskeletal:  Positive for joint pain, joint pain, joint swelling, myalgias, morning stiffness, muscle tenderness and myalgias.  ?Skin:  Negative for color change, rash, redness and sensitivity to sunlight.  ?Allergic/Immunologic: Positive for susceptible to infections.  ?Neurological:  Positive for numbness and headaches. Negative for dizziness, memory loss and weakness.  ?Hematological:  Positive for bruising/bleeding tendency. Negative for swollen glands.  ?Psychiatric/Behavioral:  Positive for sleep disturbance. Negative for depressed mood and confusion. The patient is not nervous/anxious.   ? ?PMFS History:  ?Patient Active Problem List  ? Diagnosis Date Noted  ? Goals of care, counseling/discussion 08/13/2019  ? Port-A-Cath in place 08/14/2018  ? Port catheter in place 01/26/2016  ? Obesity, morbid, BMI 50 or higher ([place]) 09/22/2015  ? Malignant neoplasm of both breasts in female, estrogen receptor negative ([place]) 09/22/2015  ? Coagulopathy ([place])  09/22/2015  ? Peripheral neuropathy 09/22/2015  ? Hot flashes 03/24/2015  ? Cellulitis of chest wall 06/16/2014  ? Right shoulder pain 06/12/2013  ? Neck pain 06/12/2013  ? Gram-negative bacteremia ([place]) 03/21/2013  ? Anemia of chronic disease 03/21/2013  ? Breast cancer metastasized to multiple sites [facility]) 02/26/2013  ?  CHF (congestive heart failure) ([place]) 09/11/2012  ? SVC syndrome 09/05/2012  ? Neuropathy 09/05/2012  ? Hypertension 09/05/2012  ? Anxiety 09/05/2012  ?  ?Past Medical History:  ?Diagnosis Date  ? Breast cancer ([place])   ? mets to liver and lung  ? Breast cancer metastasized to multiple sites [facility]) 02/26/2013  ? Cellulitis   ? CHF (congestive heart failure) ([place])   ? History of chemotherapy 09/2004  ? taxotere/herceptin/carboplatin  ? Hypertension   ? Neuropathy   ? Radiation 07/31/2006  ? left upper chest  ? Radiation 06/17/2006-06/27/2006  ? 6480 cGy bilat. chest wall  ? SVC syndrome   ? Thrombosis   ?  ?Family History  ?Problem Relation Age of Onset  ? Heart failure Father   ? Cancer Father   ?     Prostate cancer  ? Heart failure Brother   ? Cancer Brother   ?     Prostate cancer  ? Diabetes Maternal Aunt   ? ?Past Surgical History:  ?Procedure Laterality Date  ? ANKLE SURGERY Left   ? BACK SURGERY    ? CHOLECYSTECTOMY  08/28/1987  ? MASTECTOMY Bilateral   ? PERIPHERALLY INSERTED CENTRAL CATHETER INSERTION    ? PICC LINE INSERTION    ? PICC LINE REMOVAL ([place] [facility])    ? SURGICAL EXCISION OF EXCESSIVE SKIN    ? TUBAL LIGATION  08/27/1984  ? ?Social History  ? ?Social History Narrative  ? Not on file  ? ?Immunization History  ?Administered Date(s) Administered  ? Fluad Quad(high Dose 65+) 05/21/2019, 05/18/2020, 06/22/2021  ? Influenza, High Dose Seasonal PF 06/19/2017  ? Influenza,inj,Quad PF,6+ Mos 06/20/2016, 07/16/2018  ? Pneumococcal Conjugate-13 02/06/2017  ?  ? ?Objective: ?Vital Signs: BP (!) 154/101 (BP Location: Right Wrist, Patient Position: Sitting, Cuff Size: Normal)   Pulse 78   Ht 5' 3.5" (1.613 m)   Wt 248 lb (112.5 kg)   BMI 43.24 kg/m?   ? ?Physical Exam ?Vitals and nursing note reviewed.  ?Constitutional:   ?   Appearance: She is well-developed.  ?HENT:  ?   Head: Normocephalic and atraumatic.  ?Eyes:  ?   Conjunctiva/sclera: Conjunctivae normal.  ?Cardiovascular:  ?   Rate and Rhythm: Normal rate  and regular rhythm.  ?   Heart sounds: Normal heart sounds.  ?Pulmonary:  ?   Effort: Pulmonary effort is normal.  ?   Breath sounds: Normal breath sounds.  ?Abdominal:  ?   General: Bowel sounds are normal.  ?   Palpations: Abdomen is soft.  ?Musculoskeletal:  ?   Cervical back: Normal range of motion.  ?Lymphadenopathy:  ?   Cervical: No cervical adenopathy.  ?Skin: ?   General: Skin is warm and dry.  ?   Capillary Refill: Capillary refill takes less than 2 seconds.  ?Neurological:  ?   Mental Status: She is alert and oriented to person, place, and time.  ?Psychiatric:     ?   Behavior: Behavior normal.  ?  ? ?Musculoskeletal Exam: She had limited lateral rotation of the cervical spine with discomfort.  She had her left shoulder joint abduction limited to about 90 degrees.  Elbow joints  and wrist joints with good range of motion.  There was no synovitis of the joints, MCPs, PIPs or DIPs.  Hip joints with good range of motion without discomfort.  She had limited extension of bilateral knee joints without any warmth swelling or effusion.  There was no tenderness over ankles or MTPs. ? ?CDAI Exam: ?CDAI Score: -- ?Patient Global: --; Provider Global: -- ?Swollen: --; Tender: -- ?Joint Exam 12/19/2021  ? ?No joint exam has been documented for this visit  ? ?There is currently no information documented on the homunculus. Go to the Rheumatology activity and complete the homunculus joint exam. ? ?Investigation: ?No additional findings. ? ?Imaging: ?No results found. ? ?Recent Labs: ?Lab Results  ?Component Value Date  ? WBC 9.6 12/14/2021  ? HGB 11.1 (L) 12/14/2021  ? PLT 332 12/14/2021  ? NA 142 12/14/2021  ? K 3.8 12/14/2021  ? CL 103 12/14/2021  ? CO2 30 12/14/2021  ? GLUCOSE 90 12/14/2021  ? BUN 16 12/14/2021  ? CREATININE 1.24 (H) 12/14/2021  ? BILITOT 0.6 12/14/2021  ? ALKPHOS 65 12/14/2021  ? AST 28 12/14/2021  ? ALT 25 12/14/2021  ? PROT 7.4 12/14/2021  ? ALBUMIN 3.6 12/14/2021  ? CALCIUM 8.9 12/14/2021  ? GFRAA  >60 05/18/2020  ? ? ?Speciality Comments: No specialty comments available. ? ?Procedures:  ?No procedures performed ?Allergies: Penicillins and Adhesive [tape]  ? ?Assessment / Plan:     ?Visit Diagnose

## 2021-12-11 ENCOUNTER — Ambulatory Visit: Payer: Medicare Other | Admitting: Rheumatology

## 2021-12-13 ENCOUNTER — Other Ambulatory Visit: Payer: Self-pay | Admitting: *Deleted

## 2021-12-13 DIAGNOSIS — C50919 Malignant neoplasm of unspecified site of unspecified female breast: Secondary | ICD-10-CM

## 2021-12-13 DIAGNOSIS — Z7901 Long term (current) use of anticoagulants: Secondary | ICD-10-CM | POA: Diagnosis not present

## 2021-12-14 ENCOUNTER — Inpatient Hospital Stay (HOSPITAL_BASED_OUTPATIENT_CLINIC_OR_DEPARTMENT_OTHER): Payer: Medicare Other | Admitting: Hematology and Oncology

## 2021-12-14 ENCOUNTER — Inpatient Hospital Stay: Payer: Medicare Other | Attending: Oncology

## 2021-12-14 ENCOUNTER — Other Ambulatory Visit: Payer: Self-pay | Admitting: *Deleted

## 2021-12-14 ENCOUNTER — Other Ambulatory Visit: Payer: Self-pay

## 2021-12-14 ENCOUNTER — Inpatient Hospital Stay: Payer: Medicare Other

## 2021-12-14 VITALS — BP 164/82 | HR 82 | Temp 97.8°F | Resp 18 | Ht 63.0 in | Wt 254.1 lb

## 2021-12-14 DIAGNOSIS — C50812 Malignant neoplasm of overlapping sites of left female breast: Secondary | ICD-10-CM | POA: Diagnosis not present

## 2021-12-14 DIAGNOSIS — Z7901 Long term (current) use of anticoagulants: Secondary | ICD-10-CM | POA: Insufficient documentation

## 2021-12-14 DIAGNOSIS — Z5112 Encounter for antineoplastic immunotherapy: Secondary | ICD-10-CM | POA: Diagnosis not present

## 2021-12-14 DIAGNOSIS — I871 Compression of vein: Secondary | ICD-10-CM | POA: Insufficient documentation

## 2021-12-14 DIAGNOSIS — C50919 Malignant neoplasm of unspecified site of unspecified female breast: Secondary | ICD-10-CM

## 2021-12-14 DIAGNOSIS — C50911 Malignant neoplasm of unspecified site of right female breast: Secondary | ICD-10-CM | POA: Diagnosis not present

## 2021-12-14 DIAGNOSIS — C7951 Secondary malignant neoplasm of bone: Secondary | ICD-10-CM | POA: Diagnosis not present

## 2021-12-14 DIAGNOSIS — Z171 Estrogen receptor negative status [ER-]: Secondary | ICD-10-CM | POA: Insufficient documentation

## 2021-12-14 DIAGNOSIS — Z95828 Presence of other vascular implants and grafts: Secondary | ICD-10-CM

## 2021-12-14 DIAGNOSIS — C7800 Secondary malignant neoplasm of unspecified lung: Secondary | ICD-10-CM | POA: Diagnosis not present

## 2021-12-14 LAB — CMP (CANCER CENTER ONLY)
ALT: 25 U/L (ref 0–44)
AST: 28 U/L (ref 15–41)
Albumin: 3.6 g/dL (ref 3.5–5.0)
Alkaline Phosphatase: 65 U/L (ref 38–126)
Anion gap: 9 (ref 5–15)
BUN: 16 mg/dL (ref 8–23)
CO2: 30 mmol/L (ref 22–32)
Calcium: 8.9 mg/dL (ref 8.9–10.3)
Chloride: 103 mmol/L (ref 98–111)
Creatinine: 1.24 mg/dL — ABNORMAL HIGH (ref 0.44–1.00)
GFR, Estimated: 47 mL/min — ABNORMAL LOW (ref >60)
Glucose, Bld: 90 mg/dL (ref 70–99)
Potassium: 3.8 mmol/L (ref 3.5–5.1)
Sodium: 142 mmol/L (ref 135–145)
Total Bilirubin: 0.6 mg/dL (ref 0.3–1.2)
Total Protein: 7.4 g/dL (ref 6.5–8.1)

## 2021-12-14 LAB — CBC WITH DIFFERENTIAL (CANCER CENTER ONLY)
Abs Immature Granulocytes: 0.1 10*3/uL — ABNORMAL HIGH (ref 0.00–0.07)
Basophils Absolute: 0 10*3/uL (ref 0.0–0.1)
Basophils Relative: 0 %
Eosinophils Absolute: 0.4 10*3/uL (ref 0.0–0.5)
Eosinophils Relative: 5 %
HCT: 31.8 % — ABNORMAL LOW (ref 36.0–46.0)
Hemoglobin: 11.1 g/dL — ABNORMAL LOW (ref 12.0–15.0)
Immature Granulocytes: 1 %
Lymphocytes Relative: 23 %
Lymphs Abs: 2.2 10*3/uL (ref 0.7–4.0)
MCH: 27.6 pg (ref 26.0–34.0)
MCHC: 34.9 g/dL (ref 30.0–36.0)
MCV: 79.1 fL — ABNORMAL LOW (ref 80.0–100.0)
Monocytes Absolute: 1 10*3/uL (ref 0.1–1.0)
Monocytes Relative: 10 %
Neutro Abs: 5.8 10*3/uL (ref 1.7–7.7)
Neutrophils Relative %: 61 %
Platelet Count: 332 10*3/uL (ref 150–400)
RBC: 4.02 MIL/uL (ref 3.87–5.11)
RDW: 14.7 % (ref 11.5–15.5)
WBC Count: 9.6 10*3/uL (ref 4.0–10.5)
nRBC: 0 % (ref 0.0–0.2)

## 2021-12-14 MED ORDER — TRASTUZUMAB-DKST CHEMO 150 MG IV SOLR
750.0000 mg | Freq: Once | INTRAVENOUS | Status: AC
  Administered 2021-12-14: 750 mg via INTRAVENOUS
  Filled 2021-12-14: qty 35.72

## 2021-12-14 MED ORDER — HEPARIN SOD (PORK) LOCK FLUSH 100 UNIT/ML IV SOLN
500.0000 [IU] | Freq: Once | INTRAVENOUS | Status: AC | PRN
  Administered 2021-12-14: 500 [IU]

## 2021-12-14 MED ORDER — SODIUM CHLORIDE 0.9 % IJ SOLN
10.0000 mL | INTRAMUSCULAR | Status: DC | PRN
  Filled 2021-12-14: qty 10

## 2021-12-14 MED ORDER — LORAZEPAM 2 MG/ML IJ SOLN
1.0000 mg | Freq: Once | INTRAMUSCULAR | Status: AC | PRN
  Administered 2021-12-14: 1 mg via INTRAVENOUS
  Filled 2021-12-14: qty 1

## 2021-12-14 MED ORDER — SODIUM CHLORIDE 0.9% FLUSH
10.0000 mL | INTRAVENOUS | Status: DC | PRN
  Administered 2021-12-14: 10 mL

## 2021-12-14 MED ORDER — DIPHENHYDRAMINE HCL 25 MG PO CAPS
25.0000 mg | ORAL_CAPSULE | Freq: Once | ORAL | Status: AC
  Administered 2021-12-14: 25 mg via ORAL
  Filled 2021-12-14: qty 1

## 2021-12-14 MED ORDER — ACETAMINOPHEN 325 MG PO TABS
650.0000 mg | ORAL_TABLET | Freq: Once | ORAL | Status: AC
  Administered 2021-12-14: 650 mg via ORAL
  Filled 2021-12-14: qty 2

## 2021-12-14 MED ORDER — SODIUM CHLORIDE 0.9 % IV SOLN: Freq: Once | INTRAVENOUS | Status: AC

## 2021-12-14 NOTE — Assessment & Plan Note (Addendum)
2004: Liver. Lung and bone mets ?Neoadj chemo TCH X 6 completed April 2005 foll by herceptin maintenance ?? ?bilateral mastectomies?with bilateral axillary lymph node dissection 12/07/2004, showing ?????????????(a) on the right, a mypT1c ypN1 invasive ductal carcinoma, grade 3, estrogen and progesterone receptor negative, HER-2 positive, with an MIB-1 of 31% ?????????????(b) on the left, ypT2 ypN1 invasive ductal carcinoma, grade 2, estrogen and progesterone receptor negative, HER-2 positive, with an MIB-1 of 35%. ?? ?Adj XRT ?? ?Current treatment: Herceptin maintenance therapy ?Plan to continue Herceptin indefinitely every 28 days. ?? ?Breast cancer Surveillance: ?CT chest 03/27/2021: Status post bilateral mastectomy, unchanged appearances subcutaneous soft tissue.  No evidence of metastatic breast cancer recurrence.  Subpleural radiation fibrosis upper lobes ?Plan is to obtain scans once a year. ?? ?SVC syndrome: On lifelong anticoagulation.  Currently on xarelto. ?? ?We will obtain scans in August and follow-up after that. ?

## 2021-12-14 NOTE — Patient Instructions (Signed)
[facility]COL[facility]   Discharge Instructions: Thank you for choosing [facility] to provide your oncology and hematology care.   If you have a lab appointment with the Cancer Center, please go directly to the Cancer Center and check in at the registration area.   Wear comfortable clothing and clothing appropriate for easy access to any Portacath or PICC line.   We strive to give you quality time with your provider. You may need to reschedule your appointment if you arrive late (15 or more minutes).  Arriving late affects you and other patients whose appointments are after yours.  Also, if you miss three or more appointments without notifying the office, you may be dismissed from the clinic at the provider's discretion.      For prescription refill requests, have your pharmacy contact our office and allow 72 hours for refills to be completed.    Today you received the following chemotherapy and/or immunotherapy agents: trastuzumab-dkst.      To help prevent nausea and vomiting after your treatment, we encourage you to take your nausea medication as directed.  BELOW ARE SYMPTOMS THAT SHOULD BE REPORTED IMMEDIATELY: *FEVER GREATER THAN 100.4 F (38 C) OR HIGHER *CHILLS OR SWEATING *NAUSEA AND VOMITING THAT IS NOT CONTROLLED WITH YOUR NAUSEA MEDICATION *UNUSUAL SHORTNESS OF BREATH *UNUSUAL BRUISING OR BLEEDING *URINARY PROBLEMS (pain or burning when urinating, or frequent urination) *BOWEL PROBLEMS (unusual diarrhea, constipation, pain near the anus) TENDERNESS IN MOUTH AND THROAT WITH OR WITHOUT PRESENCE OF ULCERS (sore throat, sores in mouth, or a toothache) UNUSUAL RASH, SWELLING OR PAIN  UNUSUAL VAGINAL DISCHARGE OR ITCHING   Items with * indicate a potential emergency and should be followed up as soon as possible or go to the Emergency Department if any problems should occur.  Please show the CHEMOTHERAPY ALERT CARD or IMMUNOTHERAPY ALERT CARD at  check-in to the Emergency Department and triage nurse.  Should you have questions after your visit or need to cancel or reschedule your appointment, please contact [facility]  Dept: [phone]  and follow the prompts.  Office hours are 8:00 a.m. to 4:30 p.m. Monday - Friday. Please note that voicemails left after 4:00 p.m. may not be returned until the following business day.  We are closed weekends and major holidays. You have access to a nurse at all times for urgent questions. Please call the main number to the clinic Dept: [phone] and follow the prompts.   For any non-urgent questions, you may also contact your provider using MyChart. We now offer e-Visits for anyone 18 and older to request care online for non-urgent symptoms. For details visit [email or web address].   Also download the MyChart app! Go to the app store, search "MyChart", open the app, select , and log in with your MyChart username and password.  Due to Covid, a mask is required upon entering the hospital/clinic. If you do not have a mask, one will be given to you upon arrival. For doctor visits, patients may have 1 support person aged 18 or older with them. For treatment visits, patients cannot have anyone with them due to current Covid guidelines and our immunocompromised population.    

## 2021-12-17 ENCOUNTER — Other Ambulatory Visit: Payer: Self-pay | Admitting: Hematology and Oncology

## 2021-12-19 ENCOUNTER — Encounter: Payer: Self-pay | Admitting: Rheumatology

## 2021-12-19 ENCOUNTER — Ambulatory Visit: Payer: Self-pay

## 2021-12-19 ENCOUNTER — Ambulatory Visit (INDEPENDENT_AMBULATORY_CARE_PROVIDER_SITE_OTHER): Payer: Medicare Other | Admitting: Rheumatology

## 2021-12-19 ENCOUNTER — Encounter: Payer: Self-pay | Admitting: Hematology and Oncology

## 2021-12-19 ENCOUNTER — Encounter: Payer: Self-pay | Admitting: Oncology

## 2021-12-19 VITALS — BP 154/101 | HR 78 | Ht 63.5 in | Wt 248.0 lb

## 2021-12-19 DIAGNOSIS — G8929 Other chronic pain: Secondary | ICD-10-CM

## 2021-12-19 DIAGNOSIS — M25512 Pain in left shoulder: Secondary | ICD-10-CM

## 2021-12-19 DIAGNOSIS — R7881 Bacteremia: Secondary | ICD-10-CM

## 2021-12-19 DIAGNOSIS — R700 Elevated erythrocyte sedimentation rate: Secondary | ICD-10-CM | POA: Diagnosis not present

## 2021-12-19 DIAGNOSIS — M25511 Pain in right shoulder: Secondary | ICD-10-CM

## 2021-12-19 DIAGNOSIS — R768 Other specified abnormal immunological findings in serum: Secondary | ICD-10-CM

## 2021-12-19 DIAGNOSIS — C50911 Malignant neoplasm of unspecified site of right female breast: Secondary | ICD-10-CM

## 2021-12-19 DIAGNOSIS — I871 Compression of vein: Secondary | ICD-10-CM

## 2021-12-19 DIAGNOSIS — M542 Cervicalgia: Secondary | ICD-10-CM | POA: Diagnosis not present

## 2021-12-19 DIAGNOSIS — I5032 Chronic diastolic (congestive) heart failure: Secondary | ICD-10-CM

## 2021-12-19 DIAGNOSIS — Z95828 Presence of other vascular implants and grafts: Secondary | ICD-10-CM

## 2021-12-19 DIAGNOSIS — M25562 Pain in left knee: Secondary | ICD-10-CM

## 2021-12-19 DIAGNOSIS — D689 Coagulation defect, unspecified: Secondary | ICD-10-CM

## 2021-12-19 DIAGNOSIS — I152 Hypertension secondary to endocrine disorders: Secondary | ICD-10-CM | POA: Diagnosis not present

## 2021-12-19 DIAGNOSIS — M25561 Pain in right knee: Secondary | ICD-10-CM

## 2021-12-19 DIAGNOSIS — G629 Polyneuropathy, unspecified: Secondary | ICD-10-CM

## 2021-12-19 DIAGNOSIS — D638 Anemia in other chronic diseases classified elsewhere: Secondary | ICD-10-CM

## 2021-12-19 DIAGNOSIS — M79641 Pain in right hand: Secondary | ICD-10-CM | POA: Diagnosis not present

## 2021-12-19 DIAGNOSIS — M79642 Pain in left hand: Secondary | ICD-10-CM

## 2021-12-19 DIAGNOSIS — Z8659 Personal history of other mental and behavioral disorders: Secondary | ICD-10-CM

## 2021-12-20 NOTE — Progress Notes (Signed)
? ?Office Visit Note ? ?Patient: [name]             ?Date of Birth: [birth year]           ?MRN: [id number]             ?PCP: Ernestene Kiel, MD ?Referring: Ernestene Kiel, MD ?Visit Date: 01/03/2022 ?Occupation: '@GUAROCC'$ @ ? ?Subjective:  ?Pain in multiple joints ? ?History of Present Illness: [name] is a 71 y.o. female with history of osteoarthritis.  She returns today for follow-up visit.  She continues to have discomfort in her cervical spine, bilateral hands and her knee joints.  She has difficulty walking due to knee joint pain.  She gets a lot of stiffness in her knees.  She has difficulty climbing stairs and getting up from the chair.  She denies any history of oral ulcers, nasal ulcers, malar rash, photosensitivity, Raynaud's phenomenon or lymphadenopathy.  She gives history of dry eyes. ? ?Activities of Daily Living:  ?Patient reports morning stiffness for all day. ?Patient Reports nocturnal pain.  ?Difficulty dressing/grooming: Reports ?Difficulty climbing stairs: Reports ?Difficulty getting out of chair: Reports ?Difficulty using hands for taps, buttons, cutlery, and/or writing: Reports ? ?Review of Systems  ?Constitutional:  Positive for fatigue.  ?HENT:  Negative for mouth sores, mouth dryness and nose dryness.   ?Eyes:  Positive for dryness. Negative for pain and itching.  ?Respiratory:  Negative for shortness of breath and difficulty breathing.   ?Cardiovascular:  Negative for chest pain and palpitations.  ?Gastrointestinal:  Negative for blood in stool, constipation and diarrhea.  ?Endocrine: Negative for increased urination.  ?Genitourinary:  Negative for difficulty urinating.  ?Musculoskeletal:  Positive for joint pain, joint pain, joint swelling, myalgias, morning stiffness, muscle tenderness and myalgias.  ?Skin:  Negative for color change, rash, redness and sensitivity to sunlight.  ?Allergic/Immunologic: Positive for susceptible to infections.  ?Neurological:  Positive  for numbness, parasthesias and weakness. Negative for dizziness, headaches and memory loss.  ?Hematological:  Positive for bruising/bleeding tendency. Negative for swollen glands.  ?Psychiatric/Behavioral:  Positive for depressed mood and sleep disturbance. Negative for confusion. The patient is nervous/anxious.   ? ?PMFS History:  ?Patient Active Problem List  ? Diagnosis Date Noted  ? Goals of care, counseling/discussion 08/13/2019  ? Port-A-Cath in place 08/14/2018  ? Port catheter in place 01/26/2016  ? Obesity, morbid, BMI 50 or higher ([place]) 09/22/2015  ? Malignant neoplasm of both breasts in female, estrogen receptor negative ([place]) 09/22/2015  ? Coagulopathy ([place]) 09/22/2015  ? Peripheral neuropathy 09/22/2015  ? Hot flashes 03/24/2015  ? Cellulitis of chest wall 06/16/2014  ? Right shoulder pain 06/12/2013  ? Neck pain 06/12/2013  ? Gram-negative bacteremia ([place]) 03/21/2013  ? Anemia of chronic disease 03/21/2013  ? Breast cancer metastasized to multiple sites [facility]) 02/26/2013  ? CHF (congestive heart failure) ([place]) 09/11/2012  ? SVC syndrome 09/05/2012  ? Neuropathy 09/05/2012  ? Hypertension 09/05/2012  ? Anxiety 09/05/2012  ?  ?Past Medical History:  ?Diagnosis Date  ? Breast cancer ([place])   ? mets to liver and lung  ? Breast cancer metastasized to multiple sites [facility]) 02/26/2013  ? Cellulitis   ? CHF (congestive heart failure) ([place])   ? History of chemotherapy 09/2004  ? taxotere/herceptin/carboplatin  ? Hypertension   ? Neuropathy   ? Radiation 07/31/2006  ? left upper chest  ? Radiation 06/17/2006-06/27/2006  ? 6480 cGy bilat. chest wall  ? SVC syndrome   ? Thrombosis   ?  ?  Family History  ?Problem Relation Age of Onset  ? Heart failure Father   ? Cancer Father   ?     Prostate cancer  ? Heart failure Brother   ? Cancer Brother   ?     Prostate cancer  ? Diabetes Maternal Aunt   ? ?Past Surgical History:  ?Procedure Laterality Date  ? ANKLE SURGERY Left   ? BACK SURGERY    ? CHOLECYSTECTOMY  08/28/1987  ?  MASTECTOMY Bilateral   ? PERIPHERALLY INSERTED CENTRAL CATHETER INSERTION    ? PICC LINE INSERTION    ? PICC LINE REMOVAL ([facility])    ? SURGICAL EXCISION OF EXCESSIVE SKIN    ? TUBAL LIGATION  08/27/1984  ? ?Social History  ? ?Social History Narrative  ? Not on file  ? ?Immunization History  ?Administered Date(s) Administered  ? Fluad Quad(high Dose 65+) 05/21/2019, 05/18/2020, 06/22/2021  ? Influenza, High Dose Seasonal PF 06/19/2017  ? Influenza,inj,Quad PF,6+ Mos 06/20/2016, 07/16/2018  ? Pneumococcal Conjugate-13 02/06/2017  ?  ? ?Objective: ?Vital Signs: BP (!) 154/96 (BP Location: Right Arm, Patient Position: Sitting, Cuff Size: Normal)   Pulse 65   Ht 5' 3.5" (1.613 m)   Wt 252 lb 9.6 oz (114.6 kg)   BMI 44.04 kg/m?   ? ?Physical Exam ?Vitals and nursing note reviewed.  ?Constitutional:   ?   Appearance: She is well-developed.  ?HENT:  ?   Head: Normocephalic and atraumatic.  ?Eyes:  ?   Conjunctiva/sclera: Conjunctivae normal.  ?Cardiovascular:  ?   Rate and Rhythm: Normal rate and regular rhythm.  ?   Heart sounds: Normal heart sounds.  ?Pulmonary:  ?   Effort: Pulmonary effort is normal.  ?   Breath sounds: Normal breath sounds.  ?Abdominal:  ?   General: Bowel sounds are normal.  ?   Palpations: Abdomen is soft.  ?Musculoskeletal:  ?   Cervical back: Normal range of motion.  ?Lymphadenopathy:  ?   Cervical: No cervical adenopathy.  ?Skin: ?   General: Skin is warm and dry.  ?   Capillary Refill: Capillary refill takes less than 2 seconds.  ?Neurological:  ?   Mental Status: She is alert and oriented to person, place, and time.  ?Psychiatric:     ?   Behavior: Behavior normal.  ?  ? ?Musculoskeletal Exam: She had limited lateral rotation with some discomfort.  Shoulder joints, elbow joints, wrist joints, MCPs PIPs and DIPs with good range of motion with no synovitis.  Hip joints were difficult to assess in the sitting position.  She had limited painful extension of bilateral knee joints.  No warmth  swelling or effusion was noted.  She had no tenderness over ankles or MTPs. ? ?CDAI Exam: ?CDAI Score: -- ?Patient Global: --; Provider Global: -- ?Swollen: --; Tender: -- ?Joint Exam 01/03/2022  ? ?No joint exam has been documented for this visit  ? ?There is currently no information documented on the homunculus. Go to the Rheumatology activity and complete the homunculus joint exam. ? ?Investigation: ?No additional findings. ? ?Imaging: ?XR Cervical Spine 2 or 3 views ? ?Result Date: 01/03/2022 ?Multilevel spondylosis with anterior osteophytes was noted.  Facet joint arthropathy was noted. Impression: These findings are consistent with multilevel spondylosis and facet joint arthropathy. ? ?XR Hand 2 View Left ? ?Result Date: 01/03/2022 ?CMC, PIP and DIP narrowing was noted.  No MCP, intercarpal or radiocarpal joint space narrowing was noted.  No erosive changes were noted. Impression:  These findings are consistent with osteoarthritis of the hand. ? ?XR Hand 2 View Right ? ?Result Date: 01/03/2022 ?CMC, PIP and DIP narrowing was noted.  No MCP, intercarpal or radiocarpal joint space narrowing was noted.  No erosive changes were noted. Impression: These findings are consistent with osteoarthritis of the hand. ? ?XR KNEE 3 VIEW LEFT ? ?Result Date: 01/03/2022 ?Severe medial compartment narrowing with the medial, intercondylar and lateral osteophytes was noted.  Subluxation of the knee joint was noted.  Severe patellofemoral narrowing with osteophytes was noted. Impression: These findings are consistent with severe osteoarthritis and severe chondromalacia patella. ? ?XR KNEE 3 VIEW RIGHT ? ?Result Date: 01/03/2022 ?Severe medial compartment narrowing with the medial, intercondylar and lateral osteophytes was noted.  Subluxation of the knee joint was noted.  Severe patellofemoral narrowing with osteophytes was noted. Impression: These findings are consistent with severe osteoarthritis and severe chondromalacia patella.   ? ?Recent Labs: ?Lab Results  ?Component Value Date  ? WBC 9.6 12/14/2021  ? HGB 11.1 (L) 12/14/2021  ? PLT 332 12/14/2021  ? NA 142 12/14/2021  ? K 3.8 12/14/2021  ? CL 103 12/14/2021  ? CO2 30 12/14/2021  ? G

## 2021-12-22 LAB — CARDIOLIPIN ANTIBODIES, IGG, IGM, IGA
Anticardiolipin IgA: <2 APL-U/mL (ref <20.0)
Anticardiolipin IgG: <2 GPL-U/mL (ref <20.0)
Anticardiolipin IgM: <2 MPL-U/mL (ref <20.0)

## 2021-12-22 LAB — BETA-2 GLYCOPROTEIN ANTIBODIES
Beta-2 Glyco 1 IgA: <2 U/mL (ref <20.0)
Beta-2 Glyco 1 IgM: 2.1 U/mL (ref <20.0)
Beta-2 Glyco I IgG: <2 U/mL (ref <20.0)

## 2021-12-22 LAB — ANTI-DNA ANTIBODY, DOUBLE-STRANDED: ds DNA Ab: 20 IU/mL — ABNORMAL HIGH

## 2021-12-22 LAB — ANTI-NUCLEAR AB-TITER (ANA TITER): ANA Titer 1: 1:40 {titer} — ABNORMAL HIGH

## 2021-12-22 LAB — GLUCOSE 6 PHOSPHATE DEHYDROGENASE: G-6PDH: 7.6 U/g Hgb (ref 7.0–20.5)

## 2021-12-22 LAB — ANA: Anti Nuclear Antibody (ANA): POSITIVE — AB

## 2021-12-22 LAB — CYCLIC CITRUL PEPTIDE ANTIBODY, IGG: Cyclic Citrullin Peptide Ab: <16 UNITS

## 2021-12-22 LAB — C3 AND C4
C3 Complement: 151 mg/dL (ref 83–193)
C4 Complement: 33 mg/dL (ref 15–57)

## 2021-12-24 NOTE — Progress Notes (Signed)
We will discuss results at the follow-up visit.

## 2021-12-27 DIAGNOSIS — Z7901 Long term (current) use of anticoagulants: Secondary | ICD-10-CM | POA: Diagnosis not present

## 2021-12-29 LAB — PROTIME-INR: INR: 2.6 — AB (ref 0.80–1.20)

## 2022-01-03 ENCOUNTER — Ambulatory Visit (INDEPENDENT_AMBULATORY_CARE_PROVIDER_SITE_OTHER): Payer: Medicare Other

## 2022-01-03 ENCOUNTER — Encounter: Payer: Self-pay | Admitting: Rheumatology

## 2022-01-03 ENCOUNTER — Ambulatory Visit (INDEPENDENT_AMBULATORY_CARE_PROVIDER_SITE_OTHER): Payer: Medicare Other | Admitting: Rheumatology

## 2022-01-03 VITALS — BP 154/96 | HR 65 | Ht 63.5 in | Wt 252.6 lb

## 2022-01-03 DIAGNOSIS — D638 Anemia in other chronic diseases classified elsewhere: Secondary | ICD-10-CM | POA: Diagnosis not present

## 2022-01-03 DIAGNOSIS — I871 Compression of vein: Secondary | ICD-10-CM

## 2022-01-03 DIAGNOSIS — Z95828 Presence of other vascular implants and grafts: Secondary | ICD-10-CM

## 2022-01-03 DIAGNOSIS — M25511 Pain in right shoulder: Secondary | ICD-10-CM | POA: Diagnosis not present

## 2022-01-03 DIAGNOSIS — C50911 Malignant neoplasm of unspecified site of right female breast: Secondary | ICD-10-CM

## 2022-01-03 DIAGNOSIS — R700 Elevated erythrocyte sedimentation rate: Secondary | ICD-10-CM

## 2022-01-03 DIAGNOSIS — I152 Hypertension secondary to endocrine disorders: Secondary | ICD-10-CM

## 2022-01-03 DIAGNOSIS — M5030 Other cervical disc degeneration, unspecified cervical region: Secondary | ICD-10-CM | POA: Diagnosis not present

## 2022-01-03 DIAGNOSIS — M170 Bilateral primary osteoarthritis of knee: Secondary | ICD-10-CM | POA: Diagnosis not present

## 2022-01-03 DIAGNOSIS — I5032 Chronic diastolic (congestive) heart failure: Secondary | ICD-10-CM | POA: Diagnosis not present

## 2022-01-03 DIAGNOSIS — Z8659 Personal history of other mental and behavioral disorders: Secondary | ICD-10-CM

## 2022-01-03 DIAGNOSIS — M542 Cervicalgia: Secondary | ICD-10-CM

## 2022-01-03 DIAGNOSIS — M1712 Unilateral primary osteoarthritis, left knee: Secondary | ICD-10-CM

## 2022-01-03 DIAGNOSIS — M19042 Primary osteoarthritis, left hand: Secondary | ICD-10-CM

## 2022-01-03 DIAGNOSIS — R768 Other specified abnormal immunological findings in serum: Secondary | ICD-10-CM

## 2022-01-03 DIAGNOSIS — D689 Coagulation defect, unspecified: Secondary | ICD-10-CM | POA: Diagnosis not present

## 2022-01-03 DIAGNOSIS — M19041 Primary osteoarthritis, right hand: Secondary | ICD-10-CM

## 2022-01-03 DIAGNOSIS — M1711 Unilateral primary osteoarthritis, right knee: Secondary | ICD-10-CM | POA: Diagnosis not present

## 2022-01-03 DIAGNOSIS — M25512 Pain in left shoulder: Secondary | ICD-10-CM

## 2022-01-03 DIAGNOSIS — M79641 Pain in right hand: Secondary | ICD-10-CM

## 2022-01-03 DIAGNOSIS — G8929 Other chronic pain: Secondary | ICD-10-CM

## 2022-01-03 DIAGNOSIS — G629 Polyneuropathy, unspecified: Secondary | ICD-10-CM

## 2022-01-03 DIAGNOSIS — R7881 Bacteremia: Secondary | ICD-10-CM

## 2022-01-03 NOTE — Patient Instructions (Signed)
Cervical Strain and Sprain Rehab ?Ask your health care provider which exercises are safe for you. Do exercises exactly as told by your health care provider and adjust them as directed. It is normal to feel mild stretching, pulling, tightness, or discomfort as you do these exercises. Stop right away if you feel sudden pain or your pain gets worse. Do not begin these exercises until told by your health care provider. ?Stretching and range-of-motion exercises ?Cervical side bending ? ?Using good posture, sit on a stable chair or stand up. ?Without moving your shoulders, slowly tilt your left / right ear to your shoulder until you feel a stretch in the opposite side neck muscles. You should be looking straight ahead. ?Hold for __________ seconds. ?Repeat with the other side of your neck. ?Repeat __________ times. Complete this exercise __________ times a day. ?Cervical rotation ? ?Using good posture, sit on a stable chair or stand up. ?Slowly turn your head to the side as if you are looking over your left / right shoulder. ?Keep your eyes level with the ground. ?Stop when you feel a stretch along the side and the back of your neck. ?Hold for __________ seconds. ?Repeat this by turning to your other side. ?Repeat __________ times. Complete this exercise __________ times a day. ?Thoracic extension and pectoral stretch ? ?Roll a towel or a small blanket so it is about 4 inches (10 cm) in diameter. ?Lie down on your back on a firm surface. ?Put the towel in the middle of your back across your spine. It should not be under your shoulder blades. ?Put your hands behind your head and let your elbows fall out to your sides. ?Hold for __________ seconds. ?Repeat __________ times. Complete this exercise __________ times a day. ?Strengthening exercises ?Isometric upper cervical flexion ? ?Lie on your back with a thin pillow behind your head and a small rolled-up towel under your neck. ?Gently tuck your chin toward your chest and  nod your head down to look toward your feet. Do not lift your head off the pillow. ?Hold for __________ seconds. ?Release the tension slowly. Relax your neck muscles completely before you repeat this exercise. ?Repeat __________ times. Complete this exercise __________ times a day. ?Isometric cervical extension ? ?Stand about 6 inches (15 cm) away from a wall, with your back facing the wall. ?Place a soft object, about 6-8 inches (15-20 cm) in diameter, between the back of your head and the wall. A soft object could be a small pillow, a ball, or a folded towel. ?Gently tilt your head back and press into the soft object. Keep your jaw and forehead relaxed. ?Hold for __________ seconds. ?Release the tension slowly. Relax your neck muscles completely before you repeat this exercise. ?Repeat __________ times. Complete this exercise __________ times a day. ?Posture and body mechanics ?Body mechanics refers to the movements and positions of your body while you do your daily activities. Posture is part of body mechanics. Good posture and healthy body mechanics can help to relieve stress in your body's tissues and joints. Good posture means that your spine is in its natural S-curve position (your spine is neutral), your shoulders are pulled back slightly, and your head is not tipped forward. The following are general guidelines for applying improved posture and body mechanics to your everyday activities. ?Sitting ? ?When sitting, keep your spine neutral and keep your feet flat on the floor. Use a footrest, if necessary, and keep your thighs parallel to the floor. Avoid rounding  your shoulders, and avoid tilting your head forward. ?When working at a desk or a computer, keep your desk at a height where your hands are slightly lower than your elbows. Slide your chair under your desk so you are close enough to maintain good posture. ?When working at a computer, place your monitor at a height where you are looking straight ahead  and you do not have to tilt your head forward or downward to look at the screen. ?Standing ? ?When standing, keep your spine neutral and keep your feet about hip-width apart. Keep a slight bend in your knees. Your ears, shoulders, and hips should line up. ?When you do a task in which you stand in one place for a long time, place one foot up on a stable object that is 2-4 inches (5-10 cm) high, such as a footstool. This helps keep your spine neutral. ?Resting ?When lying down and resting, avoid positions that are most painful for you. Try to support your neck in a neutral position. You can use a contour pillow or a small rolled-up towel. Your pillow should support your neck but not push on it. ?This information is not intended to replace advice given to you by your health care provider. Make sure you discuss any questions you have with your health care provider. ?Document Revised: 07/03/2021 Document Reviewed: 07/03/2021 ?Elsevier Patient Education ? [place]. ?Hand Exercises ?Hand exercises can be helpful for almost anyone. These exercises can strengthen the hands, improve flexibility and movement, and increase blood flow to the hands. These results can make work and daily tasks easier. ?Hand exercises can be especially helpful for people who have joint pain from arthritis or have nerve damage from overuse (carpal tunnel syndrome). ?These exercises can also help people who have injured a hand. ?Exercises ?Most of these hand exercises are gentle stretching and motion exercises. It is usually safe to do them often throughout the day. Warming up your hands before exercise may help to reduce stiffness. You can do this with gentle massage or by placing your hands in warm water for 10-15 minutes. ?It is normal to feel some stretching, pulling, tightness, or mild discomfort as you begin new exercises. This will gradually improve. Stop an exercise right away if you feel sudden, severe pain or your pain gets worse.  Ask your health care provider which exercises are best for you. ?Knuckle bend or "claw" fist ? ?Stand or sit with your arm, hand, and all five fingers pointed straight up. Make sure to keep your wrist straight during the exercise. ?Gently bend your fingers down toward your palm until the tips of your fingers are touching the top of your palm. Keep your big knuckle straight and just bend the small knuckles in your fingers. ?Hold this position for __________ seconds. ?Straighten (extend) your fingers back to the starting position. ?Repeat this exercise 5-10 times with each hand. ?Full finger fist ? ?Stand or sit with your arm, hand, and all five fingers pointed straight up. Make sure to keep your wrist straight during the exercise. ?Gently bend your fingers into your palm until the tips of your fingers are touching the middle of your palm. ?Hold this position for __________ seconds. ?Extend your fingers back to the starting position, stretching every joint fully. ?Repeat this exercise 5-10 times with each hand. ?Straight fist ?Stand or sit with your arm, hand, and all five fingers pointed straight up. Make sure to keep your wrist straight during the exercise. ?Gently bend  your fingers at the big knuckle, where your fingers meet your hand, and the middle knuckle. Keep the knuckle at the tips of your fingers straight and try to touch the bottom of your palm. ?Hold this position for __________ seconds. ?Extend your fingers back to the starting position, stretching every joint fully. ?Repeat this exercise 5-10 times with each hand. ?Tabletop ? ?Stand or sit with your arm, hand, and all five fingers pointed straight up. Make sure to keep your wrist straight during the exercise. ?Gently bend your fingers at the big knuckle, where your fingers meet your hand, as far down as you can while keeping the small knuckles in your fingers straight. Think of forming a tabletop with your fingers. ?Hold this position for __________  seconds. ?Extend your fingers back to the starting position, stretching every joint fully. ?Repeat this exercise 5-10 times with each hand. ?Finger spread ? ?Place your hand flat on a table with your palm

## 2022-01-04 ENCOUNTER — Ambulatory Visit: Payer: Medicare Other

## 2022-01-04 ENCOUNTER — Other Ambulatory Visit: Payer: Medicare Other

## 2022-01-09 DIAGNOSIS — Z7901 Long term (current) use of anticoagulants: Secondary | ICD-10-CM | POA: Diagnosis not present

## 2022-01-11 ENCOUNTER — Other Ambulatory Visit: Payer: Self-pay | Admitting: *Deleted

## 2022-01-11 ENCOUNTER — Telehealth: Payer: Self-pay | Admitting: *Deleted

## 2022-01-11 ENCOUNTER — Other Ambulatory Visit: Payer: Self-pay

## 2022-01-11 ENCOUNTER — Inpatient Hospital Stay: Payer: Medicare Other

## 2022-01-11 ENCOUNTER — Other Ambulatory Visit: Payer: Self-pay | Admitting: Hematology and Oncology

## 2022-01-11 ENCOUNTER — Ambulatory Visit: Payer: Medicare Other

## 2022-01-11 ENCOUNTER — Inpatient Hospital Stay: Payer: Medicare Other | Attending: Oncology

## 2022-01-11 VITALS — BP 141/67 | HR 67 | Temp 98.5°F | Resp 16 | Wt 256.3 lb

## 2022-01-11 DIAGNOSIS — C7951 Secondary malignant neoplasm of bone: Secondary | ICD-10-CM | POA: Insufficient documentation

## 2022-01-11 DIAGNOSIS — Z5112 Encounter for antineoplastic immunotherapy: Secondary | ICD-10-CM | POA: Diagnosis not present

## 2022-01-11 DIAGNOSIS — C50911 Malignant neoplasm of unspecified site of right female breast: Secondary | ICD-10-CM | POA: Insufficient documentation

## 2022-01-11 DIAGNOSIS — C7800 Secondary malignant neoplasm of unspecified lung: Secondary | ICD-10-CM | POA: Insufficient documentation

## 2022-01-11 DIAGNOSIS — C50812 Malignant neoplasm of overlapping sites of left female breast: Secondary | ICD-10-CM | POA: Insufficient documentation

## 2022-01-11 DIAGNOSIS — C50919 Malignant neoplasm of unspecified site of unspecified female breast: Secondary | ICD-10-CM

## 2022-01-11 DIAGNOSIS — Z171 Estrogen receptor negative status [ER-]: Secondary | ICD-10-CM | POA: Insufficient documentation

## 2022-01-11 DIAGNOSIS — Z95828 Presence of other vascular implants and grafts: Secondary | ICD-10-CM

## 2022-01-11 MED ORDER — LORAZEPAM 2 MG/ML IJ SOLN
1.0000 mg | Freq: Once | INTRAMUSCULAR | Status: AC | PRN
  Administered 2022-01-11: 1 mg via INTRAVENOUS
  Filled 2022-01-11: qty 1

## 2022-01-11 MED ORDER — SODIUM CHLORIDE 0.9% FLUSH
10.0000 mL | INTRAVENOUS | Status: DC | PRN
  Administered 2022-01-11: 10 mL

## 2022-01-11 MED ORDER — SODIUM CHLORIDE 0.9 % IV SOLN: Freq: Once | INTRAVENOUS | Status: AC

## 2022-01-11 MED ORDER — TRASTUZUMAB-DKST CHEMO 150 MG IV SOLR
750.0000 mg | Freq: Once | INTRAVENOUS | Status: AC
  Administered 2022-01-11: 750 mg via INTRAVENOUS
  Filled 2022-01-11: qty 35.72

## 2022-01-11 MED ORDER — SODIUM CHLORIDE 0.9% FLUSH
10.0000 mL | Freq: Once | INTRAVENOUS | Status: AC
  Administered 2022-01-11: 10 mL via INTRAVENOUS

## 2022-01-11 MED ORDER — DIPHENHYDRAMINE HCL 25 MG PO CAPS
25.0000 mg | ORAL_CAPSULE | Freq: Once | ORAL | Status: AC
  Administered 2022-01-11: 25 mg via ORAL
  Filled 2022-01-11: qty 1

## 2022-01-11 MED ORDER — WARFARIN SODIUM 2.5 MG PO TABS: 2.5000 mg | ORAL_TABLET | Freq: Every day | ORAL | 3 refills | Status: DC

## 2022-01-11 MED ORDER — ALPRAZOLAM 1 MG PO TABS: 1.0000 mg | ORAL_TABLET | ORAL | 1 refills | Status: DC | PRN

## 2022-01-11 MED ORDER — HEPARIN SOD (PORK) LOCK FLUSH 100 UNIT/ML IV SOLN
500.0000 [IU] | Freq: Once | INTRAVENOUS | Status: AC | PRN
  Administered 2022-01-11: 500 [IU]

## 2022-01-11 MED ORDER — WARFARIN SODIUM 5 MG PO TABS: 5.0000 mg | ORAL_TABLET | Freq: Every day | ORAL | 3 refills | Status: DC

## 2022-01-11 MED ORDER — ACETAMINOPHEN 325 MG PO TABS
650.0000 mg | ORAL_TABLET | Freq: Once | ORAL | Status: AC
  Administered 2022-01-11: 650 mg via ORAL
  Filled 2022-01-11: qty 2

## 2022-01-11 MED ORDER — SODIUM CHLORIDE 0.9 % IJ SOLN: 10.0000 mL | INTRAMUSCULAR | Status: DC | PRN

## 2022-01-11 NOTE — Telephone Encounter (Signed)
Pt came in for treatment today and stated need for refill on alprazolam and temazepam. Per chart review noted temazepam had refills in place.  This RN contacted the pt's pharmacy and was informed the temazepam has 3 current refills available and the alprazolam was last refilled on 12/18/2021 with 2 refills remaining.

## 2022-01-11 NOTE — Telephone Encounter (Signed)
Received labs from PCP showing INR 4.5.  pt states she is currently taking Warfarin 5 mg p.o daily.  Verbal orders received from MD for pt to hold Warfarin today and tomorrow (01/12/22) and to start Warfarin 2.5 mg p.o daily Saturday 01/13/22.  MD requesting lab recheck in 1 week.  Pt notified and verbalized understanding.

## 2022-01-11 NOTE — Patient Instructions (Signed)
[facility]   Discharge Instructions: Thank you for choosing [facility] to provide your oncology and hematology care.   If you have a lab appointment with the Cancer Center, please go directly to the Cancer Center and check in at the registration area.   Wear comfortable clothing and clothing appropriate for easy access to any Portacath or PICC line.   We strive to give you quality time with your provider. You may need to reschedule your appointment if you arrive late (15 or more minutes).  Arriving late affects you and other patients whose appointments are after yours.  Also, if you miss three or more appointments without notifying the office, you may be dismissed from the clinic at the provider's discretion.      For prescription refill requests, have your pharmacy contact our office and allow 72 hours for refills to be completed.    Today you received the following chemotherapy and/or immunotherapy agents: [id number].      To help prevent nausea and vomiting after your treatment, we encourage you to take your nausea medication as directed.  BELOW ARE SYMPTOMS THAT SHOULD BE REPORTED IMMEDIATELY: *FEVER GREATER THAN 100.4 F (38 C) OR HIGHER *CHILLS OR SWEATING *NAUSEA AND VOMITING THAT IS NOT CONTROLLED WITH YOUR NAUSEA MEDICATION *UNUSUAL SHORTNESS OF BREATH *UNUSUAL BRUISING OR BLEEDING *URINARY PROBLEMS (pain or burning when urinating, or frequent urination) *BOWEL PROBLEMS (unusual diarrhea, constipation, pain near the anus) TENDERNESS IN MOUTH AND THROAT WITH OR WITHOUT PRESENCE OF ULCERS (sore throat, sores in mouth, or a toothache) UNUSUAL RASH, SWELLING OR PAIN  UNUSUAL VAGINAL DISCHARGE OR ITCHING   Items with * indicate a potential emergency and should be followed up as soon as possible or go to the Emergency Department if any problems should occur.  Please show the CHEMOTHERAPY ALERT CARD or IMMUNOTHERAPY ALERT CARD at  check-in to the Emergency Department and triage nurse.  Should you have questions after your visit or need to cancel or reschedule your appointment, please contact [facility]  Dept: [phone]  and follow the prompts.  Office hours are 8:00 a.m. to 4:30 p.m. Monday - Friday. Please note that voicemails left after 4:00 p.m. may not be returned until the following business day.  We are closed weekends and major holidays. You have access to a nurse at all times for urgent questions. Please call the main number to the clinic Dept: [phone] and follow the prompts.   For any non-urgent questions, you may also contact your provider using MyChart. We now offer e-Visits for anyone 18 and older to request care online for non-urgent symptoms. For details visit [email or web address].   Also download the MyChart app! Go to the app store, search "MyChart", open the app, select [facility], and log in with your MyChart username and password.  Due to Covid, a mask is required upon entering the hospital/clinic. If you do not have a mask, one will be given to you upon arrival. For doctor visits, patients may have 1 support person aged 18 or older with them. For treatment visits, patients cannot have anyone with them due to current Covid guidelines and our immunocompromised population.    

## 2022-01-12 ENCOUNTER — Encounter: Payer: Self-pay | Admitting: *Deleted

## 2022-01-12 ENCOUNTER — Other Ambulatory Visit: Payer: Self-pay | Admitting: *Deleted

## 2022-01-12 DIAGNOSIS — I871 Compression of vein: Secondary | ICD-10-CM

## 2022-01-12 NOTE — Progress Notes (Signed)
RN successfully faxed PT INR lab draw request to pt PCP Dr. Laqueta Due [phone]) with expected date 01/10/22.

## 2022-01-16 DIAGNOSIS — Z7901 Long term (current) use of anticoagulants: Secondary | ICD-10-CM | POA: Diagnosis not present

## 2022-01-16 LAB — PROTIME-INR: INR: 2.1 — AB (ref 0.80–1.20)

## 2022-01-17 ENCOUNTER — Telehealth: Payer: Self-pay | Admitting: *Deleted

## 2022-01-17 NOTE — Telephone Encounter (Signed)
Received fax from Dr. Felipa Emory office with recent INR results from 01/16/22 showing INR 2.1.  pt states she is currently taking Warfarin 2.5 mg p.o daily.  Per MD pt needing to continue Warfarin 2.5 mg daily and recheck lab work next week.  Pt verbalized understanding and appreciative of advice.

## 2022-01-23 DIAGNOSIS — I10 Essential (primary) hypertension: Secondary | ICD-10-CM | POA: Diagnosis not present

## 2022-01-23 DIAGNOSIS — R768 Other specified abnormal immunological findings in serum: Secondary | ICD-10-CM | POA: Diagnosis not present

## 2022-01-23 DIAGNOSIS — C50911 Malignant neoplasm of unspecified site of right female breast: Secondary | ICD-10-CM | POA: Diagnosis not present

## 2022-01-23 DIAGNOSIS — C50912 Malignant neoplasm of unspecified site of left female breast: Secondary | ICD-10-CM | POA: Diagnosis not present

## 2022-01-23 DIAGNOSIS — M170 Bilateral primary osteoarthritis of knee: Secondary | ICD-10-CM | POA: Diagnosis not present

## 2022-01-23 DIAGNOSIS — I5032 Chronic diastolic (congestive) heart failure: Secondary | ICD-10-CM | POA: Diagnosis not present

## 2022-01-23 DIAGNOSIS — Z7901 Long term (current) use of anticoagulants: Secondary | ICD-10-CM | POA: Diagnosis not present

## 2022-01-23 DIAGNOSIS — C50919 Malignant neoplasm of unspecified site of unspecified female breast: Secondary | ICD-10-CM | POA: Diagnosis not present

## 2022-01-24 DIAGNOSIS — I10 Essential (primary) hypertension: Secondary | ICD-10-CM | POA: Diagnosis not present

## 2022-01-24 DIAGNOSIS — F419 Anxiety disorder, unspecified: Secondary | ICD-10-CM | POA: Diagnosis not present

## 2022-01-25 ENCOUNTER — Other Ambulatory Visit: Payer: Self-pay

## 2022-01-25 DIAGNOSIS — C50919 Malignant neoplasm of unspecified site of unspecified female breast: Secondary | ICD-10-CM

## 2022-01-25 DIAGNOSIS — D689 Coagulation defect, unspecified: Secondary | ICD-10-CM

## 2022-01-25 DIAGNOSIS — I871 Compression of vein: Secondary | ICD-10-CM

## 2022-01-26 ENCOUNTER — Other Ambulatory Visit: Payer: Self-pay

## 2022-01-26 ENCOUNTER — Telehealth: Payer: Self-pay

## 2022-01-26 DIAGNOSIS — C50919 Malignant neoplasm of unspecified site of unspecified female breast: Secondary | ICD-10-CM

## 2022-01-26 DIAGNOSIS — D689 Coagulation defect, unspecified: Secondary | ICD-10-CM

## 2022-01-26 DIAGNOSIS — I871 Compression of vein: Secondary | ICD-10-CM

## 2022-01-26 NOTE — Telephone Encounter (Signed)
Return call to pt, confirmed with pt, per MD note, pt to take '5mg'$  coumadin daily and we are rechecking INR each week to obtain stable therapeutic dosing.  Pt verbalized understanding and thanks

## 2022-01-29 ENCOUNTER — Encounter: Payer: Self-pay | Admitting: *Deleted

## 2022-01-29 DIAGNOSIS — Z5181 Encounter for therapeutic drug level monitoring: Secondary | ICD-10-CM | POA: Diagnosis not present

## 2022-01-29 DIAGNOSIS — C7951 Secondary malignant neoplasm of bone: Secondary | ICD-10-CM | POA: Diagnosis not present

## 2022-01-29 DIAGNOSIS — Z9013 Acquired absence of bilateral breasts and nipples: Secondary | ICD-10-CM | POA: Diagnosis not present

## 2022-01-29 DIAGNOSIS — C787 Secondary malignant neoplasm of liver and intrahepatic bile duct: Secondary | ICD-10-CM | POA: Diagnosis not present

## 2022-01-29 DIAGNOSIS — Z7952 Long term (current) use of systemic steroids: Secondary | ICD-10-CM | POA: Diagnosis not present

## 2022-01-29 DIAGNOSIS — M1990 Unspecified osteoarthritis, unspecified site: Secondary | ICD-10-CM | POA: Diagnosis not present

## 2022-01-29 DIAGNOSIS — C50911 Malignant neoplasm of unspecified site of right female breast: Secondary | ICD-10-CM | POA: Diagnosis not present

## 2022-01-29 DIAGNOSIS — C50912 Malignant neoplasm of unspecified site of left female breast: Secondary | ICD-10-CM | POA: Diagnosis not present

## 2022-01-29 DIAGNOSIS — Z7901 Long term (current) use of anticoagulants: Secondary | ICD-10-CM | POA: Diagnosis not present

## 2022-01-29 DIAGNOSIS — I10 Essential (primary) hypertension: Secondary | ICD-10-CM | POA: Diagnosis not present

## 2022-01-29 DIAGNOSIS — Z9181 History of falling: Secondary | ICD-10-CM | POA: Diagnosis not present

## 2022-01-29 DIAGNOSIS — Z79891 Long term (current) use of opiate analgesic: Secondary | ICD-10-CM | POA: Diagnosis not present

## 2022-01-29 DIAGNOSIS — I871 Compression of vein: Secondary | ICD-10-CM | POA: Diagnosis not present

## 2022-01-29 NOTE — Progress Notes (Signed)
Received call from [place] with [facility] wanting to provide our office with direct contact number [phone]) for any future needs. RN verified with [facility] RN that INR will be checked weekly on Thursday's. HH RN Verbalized understanding.

## 2022-01-30 DIAGNOSIS — I871 Compression of vein: Secondary | ICD-10-CM | POA: Diagnosis not present

## 2022-01-30 DIAGNOSIS — I10 Essential (primary) hypertension: Secondary | ICD-10-CM | POA: Diagnosis not present

## 2022-01-30 DIAGNOSIS — C50911 Malignant neoplasm of unspecified site of right female breast: Secondary | ICD-10-CM | POA: Diagnosis not present

## 2022-01-30 DIAGNOSIS — C50912 Malignant neoplasm of unspecified site of left female breast: Secondary | ICD-10-CM | POA: Diagnosis not present

## 2022-01-30 DIAGNOSIS — C787 Secondary malignant neoplasm of liver and intrahepatic bile duct: Secondary | ICD-10-CM | POA: Diagnosis not present

## 2022-01-30 DIAGNOSIS — C7951 Secondary malignant neoplasm of bone: Secondary | ICD-10-CM | POA: Diagnosis not present

## 2022-02-01 ENCOUNTER — Ambulatory Visit: Payer: Medicare Other

## 2022-02-01 ENCOUNTER — Other Ambulatory Visit: Payer: Medicare Other

## 2022-02-02 ENCOUNTER — Encounter: Payer: Self-pay | Admitting: *Deleted

## 2022-02-02 DIAGNOSIS — C50911 Malignant neoplasm of unspecified site of right female breast: Secondary | ICD-10-CM | POA: Diagnosis not present

## 2022-02-02 DIAGNOSIS — C787 Secondary malignant neoplasm of liver and intrahepatic bile duct: Secondary | ICD-10-CM | POA: Diagnosis not present

## 2022-02-02 DIAGNOSIS — I871 Compression of vein: Secondary | ICD-10-CM | POA: Diagnosis not present

## 2022-02-02 DIAGNOSIS — C50912 Malignant neoplasm of unspecified site of left female breast: Secondary | ICD-10-CM | POA: Diagnosis not present

## 2022-02-02 DIAGNOSIS — C7951 Secondary malignant neoplasm of bone: Secondary | ICD-10-CM | POA: Diagnosis not present

## 2022-02-02 DIAGNOSIS — I10 Essential (primary) hypertension: Secondary | ICD-10-CM | POA: Diagnosis not present

## 2022-02-02 NOTE — Progress Notes (Signed)
Received call from Santiago Glad with [facility] stating pt INR is 2.4 today.  MD notified and verbalized understanding.  Requesting INR to be repeated in 1 week.  Pt educated to continue Warfarin dose at 5 mg daily. Pt verbalized understanding.

## 2022-02-07 DIAGNOSIS — C787 Secondary malignant neoplasm of liver and intrahepatic bile duct: Secondary | ICD-10-CM | POA: Diagnosis not present

## 2022-02-07 DIAGNOSIS — C7951 Secondary malignant neoplasm of bone: Secondary | ICD-10-CM | POA: Diagnosis not present

## 2022-02-07 DIAGNOSIS — I10 Essential (primary) hypertension: Secondary | ICD-10-CM | POA: Diagnosis not present

## 2022-02-07 DIAGNOSIS — I871 Compression of vein: Secondary | ICD-10-CM | POA: Diagnosis not present

## 2022-02-07 DIAGNOSIS — C50911 Malignant neoplasm of unspecified site of right female breast: Secondary | ICD-10-CM | POA: Diagnosis not present

## 2022-02-07 DIAGNOSIS — C50912 Malignant neoplasm of unspecified site of left female breast: Secondary | ICD-10-CM | POA: Diagnosis not present

## 2022-02-08 ENCOUNTER — Inpatient Hospital Stay: Payer: Medicare Other

## 2022-02-08 ENCOUNTER — Inpatient Hospital Stay: Payer: Medicare Other | Attending: Oncology

## 2022-02-08 ENCOUNTER — Other Ambulatory Visit: Payer: Self-pay

## 2022-02-08 VITALS — BP 117/61 | HR 58 | Temp 98.6°F | Resp 18 | Ht 63.0 in | Wt 259.8 lb

## 2022-02-08 DIAGNOSIS — Z5112 Encounter for antineoplastic immunotherapy: Secondary | ICD-10-CM | POA: Insufficient documentation

## 2022-02-08 DIAGNOSIS — C50911 Malignant neoplasm of unspecified site of right female breast: Secondary | ICD-10-CM | POA: Diagnosis not present

## 2022-02-08 DIAGNOSIS — C50011 Malignant neoplasm of nipple and areola, right female breast: Secondary | ICD-10-CM

## 2022-02-08 DIAGNOSIS — C50919 Malignant neoplasm of unspecified site of unspecified female breast: Secondary | ICD-10-CM

## 2022-02-08 DIAGNOSIS — C50812 Malignant neoplasm of overlapping sites of left female breast: Secondary | ICD-10-CM | POA: Insufficient documentation

## 2022-02-08 DIAGNOSIS — Z95828 Presence of other vascular implants and grafts: Secondary | ICD-10-CM

## 2022-02-08 LAB — CMP (CANCER CENTER ONLY)
ALT: 10 U/L (ref 0–44)
AST: 10 U/L — ABNORMAL LOW (ref 15–41)
Albumin: 4 g/dL (ref 3.5–5.0)
Alkaline Phosphatase: 74 U/L (ref 38–126)
Anion gap: 5 (ref 5–15)
BUN: 22 mg/dL (ref 8–23)
CO2: 29 mmol/L (ref 22–32)
Calcium: 9.5 mg/dL (ref 8.9–10.3)
Chloride: 105 mmol/L (ref 98–111)
Creatinine: 0.86 mg/dL (ref 0.44–1.00)
GFR, Estimated: >60 mL/min (ref >60)
Glucose, Bld: 105 mg/dL — ABNORMAL HIGH (ref 70–99)
Potassium: 4.6 mmol/L (ref 3.5–5.1)
Sodium: 139 mmol/L (ref 135–145)
Total Bilirubin: 0.8 mg/dL (ref 0.3–1.2)
Total Protein: 7.7 g/dL (ref 6.5–8.1)

## 2022-02-08 LAB — CBC WITH DIFFERENTIAL (CANCER CENTER ONLY)
Abs Immature Granulocytes: 0.04 10*3/uL (ref 0.00–0.07)
Basophils Absolute: 0 10*3/uL (ref 0.0–0.1)
Basophils Relative: 1 %
Eosinophils Absolute: 0.1 10*3/uL (ref 0.0–0.5)
Eosinophils Relative: 1 %
HCT: 30.7 % — ABNORMAL LOW (ref 36.0–46.0)
Hemoglobin: 10.8 g/dL — ABNORMAL LOW (ref 12.0–15.0)
Immature Granulocytes: 1 %
Lymphocytes Relative: 19 %
Lymphs Abs: 1.7 10*3/uL (ref 0.7–4.0)
MCH: 28.2 pg (ref 26.0–34.0)
MCHC: 35.2 g/dL (ref 30.0–36.0)
MCV: 80.2 fL (ref 80.0–100.0)
Monocytes Absolute: 0.5 10*3/uL (ref 0.1–1.0)
Monocytes Relative: 6 %
Neutro Abs: 6.5 10*3/uL (ref 1.7–7.7)
Neutrophils Relative %: 72 %
Platelet Count: 249 10*3/uL (ref 150–400)
RBC: 3.83 MIL/uL — ABNORMAL LOW (ref 3.87–5.11)
RDW: 15.7 % — ABNORMAL HIGH (ref 11.5–15.5)
WBC Count: 8.9 10*3/uL (ref 4.0–10.5)
nRBC: 0 % (ref 0.0–0.2)

## 2022-02-08 MED ORDER — LORAZEPAM 2 MG/ML IJ SOLN
1.0000 mg | Freq: Once | INTRAMUSCULAR | Status: AC | PRN
  Administered 2022-02-08: 1 mg via INTRAVENOUS
  Filled 2022-02-08: qty 1

## 2022-02-08 MED ORDER — ACETAMINOPHEN 325 MG PO TABS
650.0000 mg | ORAL_TABLET | Freq: Once | ORAL | Status: AC
  Administered 2022-02-08: 650 mg via ORAL
  Filled 2022-02-08: qty 2

## 2022-02-08 MED ORDER — TRASTUZUMAB-DKST CHEMO 150 MG IV SOLR
750.0000 mg | Freq: Once | INTRAVENOUS | Status: AC
  Administered 2022-02-08: 750 mg via INTRAVENOUS
  Filled 2022-02-08: qty 35.72

## 2022-02-08 MED ORDER — SODIUM CHLORIDE 0.9% FLUSH
10.0000 mL | INTRAVENOUS | Status: DC | PRN
  Administered 2022-02-08: 10 mL

## 2022-02-08 MED ORDER — HEPARIN SOD (PORK) LOCK FLUSH 100 UNIT/ML IV SOLN
500.0000 [IU] | Freq: Once | INTRAVENOUS | Status: AC | PRN
  Administered 2022-02-08: 500 [IU]

## 2022-02-08 MED ORDER — DIPHENHYDRAMINE HCL 25 MG PO CAPS
25.0000 mg | ORAL_CAPSULE | Freq: Once | ORAL | Status: AC
  Administered 2022-02-08: 25 mg via ORAL
  Filled 2022-02-08: qty 1

## 2022-02-08 MED ORDER — SODIUM CHLORIDE 0.9 % IV SOLN: Freq: Once | INTRAVENOUS | Status: AC

## 2022-02-08 NOTE — Patient Instructions (Signed)
[facility]ICAL [facility]COLOGY  Discharge Instructions: Thank you for choosing [facility] to provide your oncology and hematology care.   If you have a lab appointment with the Cancer Center, please go directly to the Cancer Center and check in at the registration area.   Wear comfortable clothing and clothing appropriate for easy access to any Portacath or PICC line.   We strive to give you quality time with your provider. You may need to reschedule your appointment if you arrive late (15 or more minutes).  Arriving late affects you and other patients whose appointments are after yours.  Also, if you miss three or more appointments without notifying the office, you may be dismissed from the clinic at the provider's discretion.      For prescription refill requests, have your pharmacy contact our office and allow 72 hours for refills to be completed.    Today you received the following chemotherapy and/or immunotherapy agents herceptin      To help prevent nausea and vomiting after your treatment, we encourage you to take your nausea medication as directed.  BELOW ARE SYMPTOMS THAT SHOULD BE REPORTED IMMEDIATELY: *FEVER GREATER THAN 100.4 F (38 C) OR HIGHER *CHILLS OR SWEATING *NAUSEA AND VOMITING THAT IS NOT CONTROLLED WITH YOUR NAUSEA MEDICATION *UNUSUAL SHORTNESS OF BREATH *UNUSUAL BRUISING OR BLEEDING *URINARY PROBLEMS (pain or burning when urinating, or frequent urination) *BOWEL PROBLEMS (unusual diarrhea, constipation, pain near the anus) TENDERNESS IN MOUTH AND THROAT WITH OR WITHOUT PRESENCE OF ULCERS (sore throat, sores in mouth, or a toothache) UNUSUAL RASH, SWELLING OR PAIN  UNUSUAL VAGINAL DISCHARGE OR ITCHING   Items with * indicate a potential emergency and should be followed up as soon as possible or go to the Emergency Department if any problems should occur.  Please show the CHEMOTHERAPY ALERT CARD or IMMUNOTHERAPY ALERT CARD at check-in to  the Emergency Department and triage nurse.  Should you have questions after your visit or need to cancel or reschedule your appointment, please contact [facility]  Dept: [phone]  and follow the prompts.  Office hours are 8:00 a.m. to 4:30 p.m. Monday - Friday. Please note that voicemails left after 4:00 p.m. may not be returned until the following business day.  We are closed weekends and major holidays. You have access to a nurse at all times for urgent questions. Please call the main number to the clinic Dept: [phone] and follow the prompts.   For any non-urgent questions, you may also contact your provider using MyChart. We now offer e-Visits for anyone 18 and older to request care online for non-urgent symptoms. For details visit [email or web address].   Also download the MyChart app! Go to the app store, search "MyChart", open the app, select , and log in with your MyChart username and password.  Masks are optional in the cancer centers. If you would like for your care team to wear a mask while they are taking care of you, please let them know. For doctor visits, patients may have with them one support person who is at least 70 years old. At this time, visitors are not allowed in the infusion area. 

## 2022-02-08 NOTE — Progress Notes (Signed)
Pt reports worsening neuropathy in bilateral feet. Pt states "it feels like I am walking on rocks." Also reports she is not taking gabapentin as prescribed and "will try to take it daily now." Will f/u with Dr. Lindi Adie at August appointment. Wilber Bihari, NP made aware.

## 2022-02-09 ENCOUNTER — Telehealth: Payer: Self-pay | Admitting: *Deleted

## 2022-02-09 DIAGNOSIS — C50911 Malignant neoplasm of unspecified site of right female breast: Secondary | ICD-10-CM | POA: Diagnosis not present

## 2022-02-09 DIAGNOSIS — I10 Essential (primary) hypertension: Secondary | ICD-10-CM | POA: Diagnosis not present

## 2022-02-09 DIAGNOSIS — C50912 Malignant neoplasm of unspecified site of left female breast: Secondary | ICD-10-CM | POA: Diagnosis not present

## 2022-02-09 DIAGNOSIS — I871 Compression of vein: Secondary | ICD-10-CM | POA: Diagnosis not present

## 2022-02-09 DIAGNOSIS — C787 Secondary malignant neoplasm of liver and intrahepatic bile duct: Secondary | ICD-10-CM | POA: Diagnosis not present

## 2022-02-09 DIAGNOSIS — C7951 Secondary malignant neoplasm of bone: Secondary | ICD-10-CM | POA: Diagnosis not present

## 2022-02-09 NOTE — Telephone Encounter (Signed)
Received call from Santiago Glad with [facility] [phone]) stating pt INR today 4.3.  States pt forgot to take Warfarin yesterday morning and took it at night before bed.  Pt proceeded to take today's dose this morning.  RN reviewed with MD and verbal orders received for pt to hold Warfarin dose tomorrow and proceed Sunday at 5 mg p.o daily and recheck next week.  Santiago Glad verbalized understanding.  RN placed call to pt and reviewed instructions, pt verbalized understanding.

## 2022-02-15 DIAGNOSIS — I10 Essential (primary) hypertension: Secondary | ICD-10-CM | POA: Diagnosis not present

## 2022-02-15 DIAGNOSIS — C50912 Malignant neoplasm of unspecified site of left female breast: Secondary | ICD-10-CM | POA: Diagnosis not present

## 2022-02-15 DIAGNOSIS — C7951 Secondary malignant neoplasm of bone: Secondary | ICD-10-CM | POA: Diagnosis not present

## 2022-02-15 DIAGNOSIS — C50911 Malignant neoplasm of unspecified site of right female breast: Secondary | ICD-10-CM | POA: Diagnosis not present

## 2022-02-15 DIAGNOSIS — C787 Secondary malignant neoplasm of liver and intrahepatic bile duct: Secondary | ICD-10-CM | POA: Diagnosis not present

## 2022-02-15 DIAGNOSIS — I871 Compression of vein: Secondary | ICD-10-CM | POA: Diagnosis not present

## 2022-02-20 ENCOUNTER — Telehealth: Payer: Self-pay | Admitting: *Deleted

## 2022-02-20 NOTE — Telephone Encounter (Signed)
Received call from pt stating home INR resulted at 2.1 and wanted to update office. Pt states she is currently taking Warfarin 5 mg p.o daily 7 days a week.  Pt notified and verbalized understanding, states pt needs to continue Warfarin 5 mg p.o daily and recheck in 2 weeks. Pt notified and verbalized understanding.

## 2022-02-21 DIAGNOSIS — Z23 Encounter for immunization: Secondary | ICD-10-CM | POA: Diagnosis not present

## 2022-02-22 DIAGNOSIS — C787 Secondary malignant neoplasm of liver and intrahepatic bile duct: Secondary | ICD-10-CM | POA: Diagnosis not present

## 2022-02-22 DIAGNOSIS — C50911 Malignant neoplasm of unspecified site of right female breast: Secondary | ICD-10-CM | POA: Diagnosis not present

## 2022-02-22 DIAGNOSIS — I10 Essential (primary) hypertension: Secondary | ICD-10-CM | POA: Diagnosis not present

## 2022-02-22 DIAGNOSIS — C50912 Malignant neoplasm of unspecified site of left female breast: Secondary | ICD-10-CM | POA: Diagnosis not present

## 2022-02-22 DIAGNOSIS — I871 Compression of vein: Secondary | ICD-10-CM | POA: Diagnosis not present

## 2022-02-22 DIAGNOSIS — C7951 Secondary malignant neoplasm of bone: Secondary | ICD-10-CM | POA: Diagnosis not present

## 2022-02-23 ENCOUNTER — Telehealth: Payer: Self-pay

## 2022-02-23 DIAGNOSIS — I10 Essential (primary) hypertension: Secondary | ICD-10-CM | POA: Diagnosis not present

## 2022-02-23 DIAGNOSIS — I871 Compression of vein: Secondary | ICD-10-CM | POA: Diagnosis not present

## 2022-02-23 DIAGNOSIS — C50912 Malignant neoplasm of unspecified site of left female breast: Secondary | ICD-10-CM | POA: Diagnosis not present

## 2022-02-23 DIAGNOSIS — C787 Secondary malignant neoplasm of liver and intrahepatic bile duct: Secondary | ICD-10-CM | POA: Diagnosis not present

## 2022-02-23 DIAGNOSIS — C7951 Secondary malignant neoplasm of bone: Secondary | ICD-10-CM | POA: Diagnosis not present

## 2022-02-23 DIAGNOSIS — C50911 Malignant neoplasm of unspecified site of right female breast: Secondary | ICD-10-CM | POA: Diagnosis not present

## 2022-02-23 NOTE — Telephone Encounter (Signed)
Return call to [place] - instructed [facility] nurse to please obtain INR tomorrow since she is not able to today.

## 2022-02-24 DIAGNOSIS — C7951 Secondary malignant neoplasm of bone: Secondary | ICD-10-CM | POA: Diagnosis not present

## 2022-02-24 DIAGNOSIS — I871 Compression of vein: Secondary | ICD-10-CM | POA: Diagnosis not present

## 2022-02-24 DIAGNOSIS — C787 Secondary malignant neoplasm of liver and intrahepatic bile duct: Secondary | ICD-10-CM | POA: Diagnosis not present

## 2022-02-24 DIAGNOSIS — C50911 Malignant neoplasm of unspecified site of right female breast: Secondary | ICD-10-CM | POA: Diagnosis not present

## 2022-02-24 DIAGNOSIS — C50912 Malignant neoplasm of unspecified site of left female breast: Secondary | ICD-10-CM | POA: Diagnosis not present

## 2022-02-24 DIAGNOSIS — I10 Essential (primary) hypertension: Secondary | ICD-10-CM | POA: Diagnosis not present

## 2022-02-26 ENCOUNTER — Telehealth: Payer: Self-pay

## 2022-02-26 DIAGNOSIS — C7951 Secondary malignant neoplasm of bone: Secondary | ICD-10-CM | POA: Diagnosis not present

## 2022-02-26 DIAGNOSIS — C50911 Malignant neoplasm of unspecified site of right female breast: Secondary | ICD-10-CM | POA: Diagnosis not present

## 2022-02-26 DIAGNOSIS — I10 Essential (primary) hypertension: Secondary | ICD-10-CM | POA: Diagnosis not present

## 2022-02-26 DIAGNOSIS — C50912 Malignant neoplasm of unspecified site of left female breast: Secondary | ICD-10-CM | POA: Diagnosis not present

## 2022-02-26 DIAGNOSIS — I871 Compression of vein: Secondary | ICD-10-CM | POA: Diagnosis not present

## 2022-02-26 DIAGNOSIS — C787 Secondary malignant neoplasm of liver and intrahepatic bile duct: Secondary | ICD-10-CM | POA: Diagnosis not present

## 2022-02-26 NOTE — Telephone Encounter (Signed)
Called and spoke to pt regarding after hours call.  Pt states HH RN returned to her home on 7/1 to recollect for INR.  Pt INR was 3.1, pt will continue current Rx regimen and HH will be back out on 7/7 to recheck.  Pt verbalized understanding and thanks

## 2022-02-28 DIAGNOSIS — C50912 Malignant neoplasm of unspecified site of left female breast: Secondary | ICD-10-CM | POA: Diagnosis not present

## 2022-02-28 DIAGNOSIS — C787 Secondary malignant neoplasm of liver and intrahepatic bile duct: Secondary | ICD-10-CM | POA: Diagnosis not present

## 2022-02-28 DIAGNOSIS — Z9013 Acquired absence of bilateral breasts and nipples: Secondary | ICD-10-CM | POA: Diagnosis not present

## 2022-02-28 DIAGNOSIS — Z79891 Long term (current) use of opiate analgesic: Secondary | ICD-10-CM | POA: Diagnosis not present

## 2022-02-28 DIAGNOSIS — M1990 Unspecified osteoarthritis, unspecified site: Secondary | ICD-10-CM | POA: Diagnosis not present

## 2022-02-28 DIAGNOSIS — Z7901 Long term (current) use of anticoagulants: Secondary | ICD-10-CM | POA: Diagnosis not present

## 2022-02-28 DIAGNOSIS — C50911 Malignant neoplasm of unspecified site of right female breast: Secondary | ICD-10-CM | POA: Diagnosis not present

## 2022-02-28 DIAGNOSIS — Z7952 Long term (current) use of systemic steroids: Secondary | ICD-10-CM | POA: Diagnosis not present

## 2022-02-28 DIAGNOSIS — I871 Compression of vein: Secondary | ICD-10-CM | POA: Diagnosis not present

## 2022-02-28 DIAGNOSIS — Z9181 History of falling: Secondary | ICD-10-CM | POA: Diagnosis not present

## 2022-02-28 DIAGNOSIS — Z5181 Encounter for therapeutic drug level monitoring: Secondary | ICD-10-CM | POA: Diagnosis not present

## 2022-02-28 DIAGNOSIS — I10 Essential (primary) hypertension: Secondary | ICD-10-CM | POA: Diagnosis not present

## 2022-02-28 DIAGNOSIS — C7951 Secondary malignant neoplasm of bone: Secondary | ICD-10-CM | POA: Diagnosis not present

## 2022-03-01 ENCOUNTER — Ambulatory Visit: Payer: Medicare Other

## 2022-03-01 ENCOUNTER — Other Ambulatory Visit: Payer: Medicare Other

## 2022-03-02 ENCOUNTER — Telehealth: Payer: Self-pay

## 2022-03-02 DIAGNOSIS — C7951 Secondary malignant neoplasm of bone: Secondary | ICD-10-CM | POA: Diagnosis not present

## 2022-03-02 DIAGNOSIS — C787 Secondary malignant neoplasm of liver and intrahepatic bile duct: Secondary | ICD-10-CM | POA: Diagnosis not present

## 2022-03-02 DIAGNOSIS — I10 Essential (primary) hypertension: Secondary | ICD-10-CM | POA: Diagnosis not present

## 2022-03-02 DIAGNOSIS — C50912 Malignant neoplasm of unspecified site of left female breast: Secondary | ICD-10-CM | POA: Diagnosis not present

## 2022-03-02 DIAGNOSIS — I871 Compression of vein: Secondary | ICD-10-CM | POA: Diagnosis not present

## 2022-03-02 DIAGNOSIS — C50911 Malignant neoplasm of unspecified site of right female breast: Secondary | ICD-10-CM | POA: Diagnosis not present

## 2022-03-02 NOTE — Telephone Encounter (Signed)
Lavella Hammock, RN with [facility] called to report pt's INR is 3.1 and pt is currently taking COUMADIN 5 mg QD. Per MD, pt should take coumadin 5 mg M-F and coumadin 2.5 mg sat & sun. Recheck in one week and call us with results. Lavella Hammock, RN verified with readback and states she will call pt to give instructions.

## 2022-03-08 ENCOUNTER — Inpatient Hospital Stay: Payer: Medicare Other

## 2022-03-08 ENCOUNTER — Inpatient Hospital Stay: Payer: Medicare Other | Attending: Oncology

## 2022-03-08 ENCOUNTER — Other Ambulatory Visit: Payer: Self-pay

## 2022-03-08 VITALS — Ht 63.0 in | Wt 266.0 lb

## 2022-03-08 VITALS — BP 120/68 | HR 66 | Temp 98.8°F | Resp 17

## 2022-03-08 DIAGNOSIS — C50911 Malignant neoplasm of unspecified site of right female breast: Secondary | ICD-10-CM | POA: Insufficient documentation

## 2022-03-08 DIAGNOSIS — Z95828 Presence of other vascular implants and grafts: Secondary | ICD-10-CM

## 2022-03-08 DIAGNOSIS — Z5112 Encounter for antineoplastic immunotherapy: Secondary | ICD-10-CM | POA: Diagnosis not present

## 2022-03-08 DIAGNOSIS — C50919 Malignant neoplasm of unspecified site of unspecified female breast: Secondary | ICD-10-CM

## 2022-03-08 DIAGNOSIS — C50812 Malignant neoplasm of overlapping sites of left female breast: Secondary | ICD-10-CM | POA: Insufficient documentation

## 2022-03-08 LAB — CMP (CANCER CENTER ONLY)
ALT: 60 U/L — ABNORMAL HIGH (ref 0–44)
AST: 59 U/L — ABNORMAL HIGH (ref 15–41)
Albumin: 3.7 g/dL (ref 3.5–5.0)
Alkaline Phosphatase: 69 U/L (ref 38–126)
Anion gap: 6 (ref 5–15)
BUN: 14 mg/dL (ref 8–23)
CO2: 30 mmol/L (ref 22–32)
Calcium: 9.3 mg/dL (ref 8.9–10.3)
Chloride: 107 mmol/L (ref 98–111)
Creatinine: 1 mg/dL (ref 0.44–1.00)
GFR, Estimated: >60 mL/min (ref >60)
Glucose, Bld: 101 mg/dL — ABNORMAL HIGH (ref 70–99)
Potassium: 3.7 mmol/L (ref 3.5–5.1)
Sodium: 143 mmol/L (ref 135–145)
Total Bilirubin: 0.8 mg/dL (ref 0.3–1.2)
Total Protein: 7.1 g/dL (ref 6.5–8.1)

## 2022-03-08 LAB — CBC WITH DIFFERENTIAL (CANCER CENTER ONLY)
Abs Immature Granulocytes: 0.03 10*3/uL (ref 0.00–0.07)
Basophils Absolute: 0.1 10*3/uL (ref 0.0–0.1)
Basophils Relative: 1 %
Eosinophils Absolute: 0.2 10*3/uL (ref 0.0–0.5)
Eosinophils Relative: 2 %
HCT: 31 % — ABNORMAL LOW (ref 36.0–46.0)
Hemoglobin: 10.8 g/dL — ABNORMAL LOW (ref 12.0–15.0)
Immature Granulocytes: 0 %
Lymphocytes Relative: 32 %
Lymphs Abs: 2.6 10*3/uL (ref 0.7–4.0)
MCH: 27.8 pg (ref 26.0–34.0)
MCHC: 34.8 g/dL (ref 30.0–36.0)
MCV: 79.9 fL — ABNORMAL LOW (ref 80.0–100.0)
Monocytes Absolute: 1 10*3/uL (ref 0.1–1.0)
Monocytes Relative: 12 %
Neutro Abs: 4.1 10*3/uL (ref 1.7–7.7)
Neutrophils Relative %: 53 %
Platelet Count: 240 10*3/uL (ref 150–400)
RBC: 3.88 MIL/uL (ref 3.87–5.11)
RDW: 15.2 % (ref 11.5–15.5)
WBC Count: 7.9 10*3/uL (ref 4.0–10.5)
nRBC: 0 % (ref 0.0–0.2)

## 2022-03-08 MED ORDER — LORAZEPAM 2 MG/ML IJ SOLN
1.0000 mg | Freq: Once | INTRAMUSCULAR | Status: AC | PRN
  Administered 2022-03-08: 1 mg via INTRAVENOUS
  Filled 2022-03-08: qty 1

## 2022-03-08 MED ORDER — TRASTUZUMAB-DKST CHEMO 150 MG IV SOLR
750.0000 mg | Freq: Once | INTRAVENOUS | Status: AC
  Administered 2022-03-08: 750 mg via INTRAVENOUS
  Filled 2022-03-08: qty 35.72

## 2022-03-08 MED ORDER — DIPHENHYDRAMINE HCL 25 MG PO CAPS
25.0000 mg | ORAL_CAPSULE | Freq: Once | ORAL | Status: AC
  Administered 2022-03-08: 25 mg via ORAL
  Filled 2022-03-08: qty 1

## 2022-03-08 MED ORDER — HEPARIN SOD (PORK) LOCK FLUSH 100 UNIT/ML IV SOLN
500.0000 [IU] | Freq: Once | INTRAVENOUS | Status: AC | PRN
  Administered 2022-03-08: 500 [IU]

## 2022-03-08 MED ORDER — ACETAMINOPHEN 325 MG PO TABS
650.0000 mg | ORAL_TABLET | Freq: Once | ORAL | Status: AC
  Administered 2022-03-08: 650 mg via ORAL
  Filled 2022-03-08: qty 2

## 2022-03-08 MED ORDER — SODIUM CHLORIDE 0.9 % IV SOLN: Freq: Once | INTRAVENOUS | Status: AC

## 2022-03-08 MED ORDER — SODIUM CHLORIDE 0.9% FLUSH
10.0000 mL | INTRAVENOUS | Status: DC | PRN
  Administered 2022-03-08: 10 mL via INTRAVENOUS

## 2022-03-08 MED ORDER — SODIUM CHLORIDE 0.9% FLUSH
10.0000 mL | INTRAVENOUS | Status: DC | PRN
  Administered 2022-03-08: 10 mL

## 2022-03-08 NOTE — Patient Instructions (Signed)
[facility]  Discharge Instructions: Thank you for choosing [facility] to provide your oncology and hematology care.   If you have a lab appointment with the [facility], please go directly to the [facility] and check in at the registration area.   Wear comfortable clothing and clothing appropriate for easy access to any Portacath or PICC line.   We strive to give you quality time with your provider. You may need to reschedule your appointment if you arrive late (15 or more minutes).  Arriving late affects you and other patients whose appointments are after yours.  Also, if you miss three or more appointments without notifying the office, you may be dismissed from the clinic at the provider's discretion.      For prescription refill requests, have your pharmacy contact our office and allow 72 hours for refills to be completed.    Today you received the following chemotherapy and/or immunotherapy agent: Herceptin      To help prevent nausea and vomiting after your treatment, we encourage you to take your nausea medication as directed.  BELOW ARE SYMPTOMS THAT SHOULD BE REPORTED IMMEDIATELY: *FEVER GREATER THAN 100.4 F (38 C) OR HIGHER *CHILLS OR SWEATING *NAUSEA AND VOMITING THAT IS NOT CONTROLLED WITH YOUR NAUSEA MEDICATION *UNUSUAL SHORTNESS OF BREATH *UNUSUAL BRUISING OR BLEEDING *URINARY PROBLEMS (pain or burning when urinating, or frequent urination) *BOWEL PROBLEMS (unusual diarrhea, constipation, pain near the anus) TENDERNESS IN MOUTH AND THROAT WITH OR WITHOUT PRESENCE OF ULCERS (sore throat, sores in mouth, or a toothache) UNUSUAL RASH, SWELLING OR PAIN  UNUSUAL VAGINAL DISCHARGE OR ITCHING   Items with * indicate a potential emergency and should be followed up as soon as possible or go to the Emergency Department if any problems should occur.  Please show the CHEMOTHERAPY ALERT CARD or IMMUNOTHERAPY ALERT CARD at check-in to  the Emergency Department and triage nurse.  Should you have questions after your visit or need to cancel or reschedule your appointment, please contact [facility]  Dept: [phone]  and follow the prompts.  Office hours are 8:00 a.m. to 4:30 p.m. Monday - Friday. Please note that voicemails left after 4:00 p.m. may not be returned until the following business day.  We are closed weekends and major holidays. You have access to a nurse at all times for urgent questions. Please call the main number to the clinic Dept: [phone] and follow the prompts.   For any non-urgent questions, you may also contact your provider using MyChart. We now offer e-Visits for anyone 74 and older to request care online for non-urgent symptoms. For details visit [email or web address].   Also download the MyChart app! Go to the app store, search "MyChart", open the app, select [facility], and log in with your MyChart username and password.  Masks are optional in the cancer centers. If you would like for your care team to wear a mask while they are taking care of you, please let them know. For doctor visits, patients may have with them one support person who is at least 70 years old. At this time, visitors are not allowed in the infusion area.

## 2022-03-08 NOTE — Patient Instructions (Signed)

## 2022-03-09 ENCOUNTER — Telehealth: Payer: Self-pay

## 2022-03-09 ENCOUNTER — Other Ambulatory Visit: Payer: Self-pay | Admitting: Hematology and Oncology

## 2022-03-09 DIAGNOSIS — C7951 Secondary malignant neoplasm of bone: Secondary | ICD-10-CM | POA: Diagnosis not present

## 2022-03-09 DIAGNOSIS — I871 Compression of vein: Secondary | ICD-10-CM | POA: Diagnosis not present

## 2022-03-09 DIAGNOSIS — C787 Secondary malignant neoplasm of liver and intrahepatic bile duct: Secondary | ICD-10-CM | POA: Diagnosis not present

## 2022-03-09 DIAGNOSIS — C50911 Malignant neoplasm of unspecified site of right female breast: Secondary | ICD-10-CM | POA: Diagnosis not present

## 2022-03-09 DIAGNOSIS — I10 Essential (primary) hypertension: Secondary | ICD-10-CM | POA: Diagnosis not present

## 2022-03-09 DIAGNOSIS — C50912 Malignant neoplasm of unspecified site of left female breast: Secondary | ICD-10-CM | POA: Diagnosis not present

## 2022-03-09 MED ORDER — ALPRAZOLAM 1 MG PO TABS: 1.0000 mg | ORAL_TABLET | ORAL | 3 refills | Status: DC | PRN

## 2022-03-09 NOTE — Progress Notes (Signed)
Called Starla RN with [facility] to provide new med instructions for pt: '5mg'$  3 days per week and 2.'5mg'$  4 days per week.  Starla read back the instructions to me.  Recheck in 1 week.

## 2022-03-09 NOTE — Telephone Encounter (Signed)
Called pt, per [facility] RN Starla,  INR today is 3.1.  Instructed pt, per MD, to take her 2.'5mg'$  tabs 4 days out of the week and '5mg'$  tabs for 3 days.  Pt verbalized understanding and read out her new dosing to me while on the phone.  Recheck INR in 1 week

## 2022-03-14 DIAGNOSIS — I10 Essential (primary) hypertension: Secondary | ICD-10-CM | POA: Diagnosis not present

## 2022-03-14 DIAGNOSIS — C50911 Malignant neoplasm of unspecified site of right female breast: Secondary | ICD-10-CM | POA: Diagnosis not present

## 2022-03-14 DIAGNOSIS — C7951 Secondary malignant neoplasm of bone: Secondary | ICD-10-CM | POA: Diagnosis not present

## 2022-03-14 DIAGNOSIS — I871 Compression of vein: Secondary | ICD-10-CM | POA: Diagnosis not present

## 2022-03-14 DIAGNOSIS — C50912 Malignant neoplasm of unspecified site of left female breast: Secondary | ICD-10-CM | POA: Diagnosis not present

## 2022-03-14 DIAGNOSIS — C787 Secondary malignant neoplasm of liver and intrahepatic bile duct: Secondary | ICD-10-CM | POA: Diagnosis not present

## 2022-03-16 ENCOUNTER — Telehealth: Payer: Self-pay | Admitting: *Deleted

## 2022-03-16 ENCOUNTER — Telehealth: Payer: Self-pay

## 2022-03-16 DIAGNOSIS — C787 Secondary malignant neoplasm of liver and intrahepatic bile duct: Secondary | ICD-10-CM | POA: Diagnosis not present

## 2022-03-16 DIAGNOSIS — C50912 Malignant neoplasm of unspecified site of left female breast: Secondary | ICD-10-CM | POA: Diagnosis not present

## 2022-03-16 DIAGNOSIS — C7951 Secondary malignant neoplasm of bone: Secondary | ICD-10-CM | POA: Diagnosis not present

## 2022-03-16 DIAGNOSIS — I10 Essential (primary) hypertension: Secondary | ICD-10-CM | POA: Diagnosis not present

## 2022-03-16 DIAGNOSIS — C50911 Malignant neoplasm of unspecified site of right female breast: Secondary | ICD-10-CM | POA: Diagnosis not present

## 2022-03-16 DIAGNOSIS — I871 Compression of vein: Secondary | ICD-10-CM | POA: Diagnosis not present

## 2022-03-16 NOTE — Telephone Encounter (Signed)
Received call from [place], [facility] RN with [facility] [phone]) stating pt INR today 2.2.  Pt currently taking Warfarin 2.5 mg p.o 4 days a week and 5 mg po 3 days a week.  Per MD pt needing to continue with that schedule and recheck in 1 week.  Starla verbalized understanding and states she will update pt to continue.

## 2022-03-16 NOTE — Telephone Encounter (Signed)
Called and spoke with pt, advised her that, per MD, medication will stay the same this week since her INR was within therapeutic range.  Pt verbalized understanding and thanks.  We will recheck INR in 1 week

## 2022-03-19 ENCOUNTER — Other Ambulatory Visit: Payer: Self-pay

## 2022-03-21 DIAGNOSIS — I10 Essential (primary) hypertension: Secondary | ICD-10-CM | POA: Diagnosis not present

## 2022-03-21 DIAGNOSIS — C7951 Secondary malignant neoplasm of bone: Secondary | ICD-10-CM | POA: Diagnosis not present

## 2022-03-21 DIAGNOSIS — I871 Compression of vein: Secondary | ICD-10-CM | POA: Diagnosis not present

## 2022-03-21 DIAGNOSIS — C50911 Malignant neoplasm of unspecified site of right female breast: Secondary | ICD-10-CM | POA: Diagnosis not present

## 2022-03-21 DIAGNOSIS — C50912 Malignant neoplasm of unspecified site of left female breast: Secondary | ICD-10-CM | POA: Diagnosis not present

## 2022-03-21 DIAGNOSIS — C787 Secondary malignant neoplasm of liver and intrahepatic bile duct: Secondary | ICD-10-CM | POA: Diagnosis not present

## 2022-03-22 ENCOUNTER — Ambulatory Visit (HOSPITAL_COMMUNITY)
Admission: RE | Admit: 2022-03-22 | Discharge: 2022-03-22 | Disposition: A | Discharge status: Home / Self Care | Payer: Medicare Other | Source: Ambulatory Visit | Attending: Cardiology | Admitting: Cardiology

## 2022-03-22 ENCOUNTER — Encounter (HOSPITAL_COMMUNITY): Payer: Self-pay | Admitting: Cardiology

## 2022-03-22 ENCOUNTER — Ambulatory Visit (HOSPITAL_BASED_OUTPATIENT_CLINIC_OR_DEPARTMENT_OTHER)
Admission: RE | Admit: 2022-03-22 | Discharge: 2022-03-22 | Disposition: A | Discharge status: Home / Self Care | Payer: Medicare Other | Source: Ambulatory Visit | Attending: Cardiology | Admitting: Cardiology

## 2022-03-22 VITALS — BP 118/70 | HR 53 | Wt 269.8 lb

## 2022-03-22 DIAGNOSIS — Z171 Estrogen receptor negative status [ER-]: Secondary | ICD-10-CM | POA: Diagnosis not present

## 2022-03-22 DIAGNOSIS — C50012 Malignant neoplasm of nipple and areola, left female breast: Secondary | ICD-10-CM

## 2022-03-22 DIAGNOSIS — I5022 Chronic systolic (congestive) heart failure: Secondary | ICD-10-CM | POA: Diagnosis not present

## 2022-03-22 DIAGNOSIS — I5032 Chronic diastolic (congestive) heart failure: Secondary | ICD-10-CM | POA: Diagnosis not present

## 2022-03-22 DIAGNOSIS — C50011 Malignant neoplasm of nipple and areola, right female breast: Secondary | ICD-10-CM | POA: Diagnosis not present

## 2022-03-22 LAB — ECHOCARDIOGRAM COMPLETE
AR max vel: 1.92 cm2
AV Area VTI: 2 cm2
AV Area mean vel: 2.11 cm2
AV Mean grad: 5 mmHg
AV Peak grad: 9.6 mmHg
Ao pk vel: 1.55 m/s
Area-P 1/2: 4.54 cm2
S' Lateral: 3.3 cm

## 2022-03-22 NOTE — Progress Notes (Signed)
Advanced Heart Failure Clinic Note   Patient ID: [name], female   DOB: 1951-01-06, 71 y.o.   MRN: [id number] Oncologist: Dr Jana Hakim  HPI: [name] is a 71 y.o. with a history of metastatic HER-2 positive breast carcinoma originally diagnosed September 2004. Started in R breast. Underwent neo-adjuvant chemo. During this time developed L breast CA. Underwent bilateral mastectomy. Lymph nodes +. 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   She received a total of 6 cycles of Taxotere/Carbo/Herceptin, completed in April 2005, after which she began single agent Herceptin, given every 4 weeks now. Plan to continue on Herceptin indefinitely.   Coronary CTA in 8/20 showed no significant CAD, calcium score 0.   She returns for cardio-oncology followup.  She continues to be stable symptomatically.  She is taking Lasix about twice a week, has done this for a while now. She uses a walker due to arthritic pain.  She denies exertional dyspnea or chest pain.  No BRBPR/melena.      Labs 11/15: K 4, creatinine 1.0   Labs 1/16: K 4.1, creatinine 1.0 Labs 8/16: K 4.3, creatinine 0.9 Labs 11/16: K 3.9, creatinine 0.9 Labs 5/17: K 3.9, creatinine 0.68 Labs 5/18: K 4, creatinine 0.65 Labs 1/19: K 4.1, creatinine 1.23 Labs 7/19: K 3.9, creatinine 1.11 Labs 1/20: K 3.8, creatinine 0.83 Labs 7/20: K 4.1, creatinine 1.18, LDL 112 Labs 3/21: K 4.2, creatinine 0.86 Labs 4/21: LDL 115, HDL 105 Labs 3/22: K 4.5, creatinine 0.86 Labs 9/22: K 4.3, creatinine 0.98, hgb 11.1 Labs 7/23: K 3.7, creatinine 1.0  04/23/12 ECHO EF 60-65% Lateral s' 8.9 cm/s  07/30/12 ECHO EF 60-65% Lateral s' 8.3 cm/s  09/11/12 ECHO EF 60-65% Lateral s' 10.3 cm/s  12/22/12 ECHO 55-60% Lateral S' 9.8 RV mildly dilated  07/01/13 ECHO 55-60%, lateral s' 9.79, mild RV dilation, grade II DD 3/15 ECHO 55%, mild MR, lateral s' 9.6, GLS -19.2% 03/02/14 ECHO EF 55-60% Lateral S' 9.4 GLS - 21.9   11/15 ECHO EF 60-65%, lateral S' 6.8, GLS -17.2%, mild RV dilation with normal systolic function, mild MR.  2/16 ECHO EF 60-65%, lateral S' 14.4, GLS -49.4%, normal diastolic function, mild RV dilation with normal RV systolic function.  8/16 ECHO EF 60-65%, mild LVH, normal RV size and systolic function, lateral s' 13.2, GLS -17%.  11/16 ECHO EF 60-65% Lateral S' 10.2, GLS -20.2% 5/17 ECHO EF 60-65%, mild LVH, lateral s' 12.2, GLS -20.8%, grade II diastolic dysfunction, normal RV. 9/17 ECHO EF 49-67%, normal diastolic function, GLS -59.1% 12/17 ECHO EF 55-60%, moderate diastolic dysfunction, GLS -20.1%, normal RV size and systolic function 6/38 ECHO EF 55-60%, GLS -46.6%, normal diastolic function, normal RV size and systolic function.  1/19 ECHO EF 55-60%, GLS -59.9%, normal diastolic function, RV normal size and systolic function.  7/19 ECHO EF 60-65%, GLS -22.1%.  1/20 ECHO EF 35-70%, normal diastolic function, normal RV, strain not done.  6/20 ECHO EF 17-79%, normal diastolic function, normal RV, GLS -24.4% 4/21 ECHO EF 39-03%, normal diastolic function, normal RV, strain tracking was not accurate.  1/22 ECHO EF 60-65%, normal RV, moderate LAE, IVC normal.  9/22 ECHO EF 60-65%, normal RV 7/23 ECHO EF 55-60%, GLS -19.1%, normal RV, mild MR.    ROS: All systems negative except as listed in HPI, PMH and Problem List.  Past Medical History:  Diagnosis Date   Breast cancer ([place])    mets to liver and lung   Breast cancer  metastasized to multiple sites [facility]) 02/26/2013   Cellulitis    CHF (congestive heart failure) ([place])    History of chemotherapy 09/2004   taxotere/herceptin/carboplatin   Hypertension    Neuropathy    Radiation 07/31/2006   left upper chest   Radiation 06/17/2006-06/27/2006   6480 cGy bilat. chest wall   SVC syndrome    Thrombosis     Current Outpatient Medications  Medication Sig Dispense Refill   acetaminophen (TYLENOL) 500 MG tablet Take 1,000 mg by mouth  every 6 (six) hours as needed for mild pain or fever.      albuterol (PROVENTIL HFA;VENTOLIN HFA) 108 (90 BASE) MCG/ACT inhaler Inhale 2 puffs into the lungs every 6 (six) hours as needed for wheezing. 1 Inhaler 5   ALPRAZolam (XANAX) 1 MG tablet Take 1 tablet (1 mg total) by mouth as needed for anxiety. 30 tablet 3   amLODipine (NORVASC) 10 MG tablet Take 1 tablet (10 mg total) by mouth every morning. 30 tablet 4   baclofen (LIORESAL) 10 MG tablet TAKE 1 TABLET BY MOUTH THREE TIMES DAILY AS NEEDED FOR MUSCLE SPASMS 270 tablet 0   carvedilol (COREG) 12.5 MG tablet TAKE 1 TABLET(12.5 MG) BY MOUTH TWICE DAILY 60 tablet 11   diclofenac sodium (VOLTAREN) 1 % GEL Apply 2 g topically daily as needed (for pain). Apply to knees and shoulders 100 g 6   DULoxetine (CYMBALTA) 20 MG capsule Take 20 mg by mouth daily.     furosemide (LASIX) 40 MG tablet Take 40 mg by mouth as needed.     gabapentin (NEURONTIN) 300 MG capsule TAKE 2 CAPSULES BY MOUTH THREE TIMES DAILY 540 capsule 0   losartan (COZAAR) 100 MG tablet Take 100 mg by mouth every morning.     oxyCODONE-acetaminophen (PERCOCET) 10-325 MG tablet Take 1 tablet by mouth 4 (four) times daily as needed.     potassium chloride SA (KLOR-CON) 20 MEQ tablet Take 1 tablet (20 mEq total) by mouth daily as needed. With use of lasix 30 tablet 3   predniSONE (DELTASONE) 5 MG tablet Take 5 mg by mouth daily in the afternoon.     spironolactone (ALDACTONE) 25 MG tablet TAKE 1 TABLET(25 MG) BY MOUTH DAILY 30 tablet 11   temazepam (RESTORIL) 30 MG capsule TAKE 1 CAPSULE(30 MG) BY MOUTH AT BEDTIME AS NEEDED FOR SLEEP 30 capsule 3   warfarin (COUMADIN) 2.5 MG tablet Take 1 tablet (2.5 mg total) by mouth daily. 30 tablet 3   warfarin (COUMADIN) 5 MG tablet Take 1 tablet (5 mg total) by mouth daily. 30 tablet 3   No current facility-administered medications for this encounter.   Facility-Administered Medications Ordered in Other Encounters  Medication Dose Route  Frequency Provider Last Rate Last Admin   sodium chloride flush (NS) 0.9 % injection 10 mL  10 mL Intravenous PRN Magrinat, Virgie Dad, MD   10 mL at 12/15/15 1200   sodium chloride flush (NS) 0.9 % injection 10 mL  10 mL Intracatheter PRN Magrinat, Virgie Dad, MD   10 mL at 08/14/18 1116   sodium chloride flush (NS) 0.9 % injection 10 mL  10 mL Intracatheter PRN Magrinat, Virgie Dad, MD        Vitals:   03/22/22 0920  BP: 118/70  Pulse: (!) 53  SpO2: 97%  Weight: 122.4 kg (269 lb 12.8 oz)    PHYSICAL EXAM: General: NAD Neck: JVP 7-8 cm, no thyromegaly or thyroid nodule.  Lungs: Clear to auscultation bilaterally with  normal respiratory effort. CV: Nondisplaced PMI.  Heart regular S1/S2, no S3/S4, no murmur.  1+ ankle edema.  No carotid bruit.  Normal pedal pulses.  Abdomen: Soft, nontender, no hepatosplenomegaly, no distention.  Skin: Intact without lesions or rashes.  Neurologic: Alert and oriented x 3.  Psych: Normal affect. Extremities: No clubbing or cyanosis.  HEENT: Normal.    ASSESSMENT & PLAN: 1) L Breast Cancer s/p bilateral mastectomies:  Symptomatically stable. I reviewed today's echo, EF and strain parameters normal. She will be continuing Herceptin indefinitely.   - Continue echo screening but have increased the interval given stability over a number of years.  Repeat echo in 9 months.    2) Suspected OSA: She has not wanted to do a sleep study.    3) HTN: BP controlled on current regimen.     4) Chronic diastolic CHF: Probably mild volume overload on exam, NYHA class II symptoms.    - Can continue to take Lasix twice a week with KCl 20 (refill KCl).  5) SVC syndrome: Continue warfarin. 6) Coronary disease risk: She has a strong FH of coronary disease, as well as HTN.  Coronary CTA in 8/20 showed no significant coronary disease and calcium score 0.  - No ASA given warfarin use.   Followup 9 months with echo.   Loralie Champagne 03/22/2022

## 2022-03-22 NOTE — Patient Instructions (Signed)
There has bee no changes to your medications.  Your physician has requested that you have an echocardiogram. Echocardiography is a painless test that uses sound waves to create images of your heart. It provides your doctor with information about the size and shape of your heart and how well your heart's chambers and valves are working. This procedure takes approximately one hour. There are no restrictions for this procedure.  Your physician recommends that you schedule a follow-up appointment in: 9 months with an echocardiogram  (April 2024) ** please call the office in February to arrange your follow up appointment **   If you have any questions or concerns before your next appointment please send [place] a message through [place] or call our office at [phone].    TO LEAVE A MESSAGE FOR THE NURSE SELECT OPTION 2, PLEASE LEAVE A MESSAGE INCLUDING: YOUR NAME DATE OF BIRTH CALL BACK NUMBER REASON FOR CALL**this is important as we prioritize the call backs  YOU WILL RECEIVE A CALL BACK THE SAME DAY AS LONG AS YOU CALL BEFORE 4:00 PM  At the [facility], you and your health needs are our priority. As part of our continuing mission to provide you with exceptional heart care, we have created designated Provider Care Teams. These Care Teams include your primary Cardiologist (physician) and Advanced Practice Providers (APPs- Physician Assistants and Nurse Practitioners) who all work together to provide you with the care you need, when you need it.   You may see any of the following providers on your designated Care Team at your next follow up: Dr Glori Bickers Dr Haynes Kerns, NP Lyda Jester, [place] [facility] [place], [place] Audry Riles, PharmD   Please be sure to bring in all your medications bottles to every appointment.

## 2022-03-22 NOTE — Progress Notes (Signed)
*  PRELIMINARY RESULTS* Echocardiogram 2D Echocardiogram has been performed.  [name] [name] [name] 03/22/2022, 9:06 AM

## 2022-03-23 ENCOUNTER — Telehealth: Payer: Self-pay | Admitting: *Deleted

## 2022-03-23 ENCOUNTER — Telehealth: Payer: Self-pay

## 2022-03-23 DIAGNOSIS — I10 Essential (primary) hypertension: Secondary | ICD-10-CM | POA: Diagnosis not present

## 2022-03-23 DIAGNOSIS — C50911 Malignant neoplasm of unspecified site of right female breast: Secondary | ICD-10-CM | POA: Diagnosis not present

## 2022-03-23 DIAGNOSIS — I871 Compression of vein: Secondary | ICD-10-CM | POA: Diagnosis not present

## 2022-03-23 DIAGNOSIS — C787 Secondary malignant neoplasm of liver and intrahepatic bile duct: Secondary | ICD-10-CM | POA: Diagnosis not present

## 2022-03-23 DIAGNOSIS — C7951 Secondary malignant neoplasm of bone: Secondary | ICD-10-CM | POA: Diagnosis not present

## 2022-03-23 DIAGNOSIS — C50912 Malignant neoplasm of unspecified site of left female breast: Secondary | ICD-10-CM | POA: Diagnosis not present

## 2022-03-23 NOTE — Telephone Encounter (Signed)
Incoming call from [place] with [facility] seeing this pt for INR.  Today's INR was 1.4 and per MD medication will be adjusted to '5mg'$  po qd, Starla verbalized understanding and read back to me the medication adjustment.  We will plan to recheck INR next week.

## 2022-03-23 NOTE — Telephone Encounter (Signed)
Received call from [facility] RN stating pt INR today 1.4.  per MD pt needing to increase warfarin to 5 mg p.o daily x7 days a week and recheck in 1 week.  HH RN Lavella Hammock educated and stated she will update pt on medication administration.

## 2022-03-26 ENCOUNTER — Telehealth: Payer: Self-pay

## 2022-03-26 ENCOUNTER — Other Ambulatory Visit: Payer: Self-pay | Admitting: Hematology and Oncology

## 2022-03-26 MED ORDER — TEMAZEPAM 30 MG PO CAPS: 30.0000 mg | ORAL_CAPSULE | Freq: Every day | ORAL | 3 refills | Status: DC

## 2022-03-26 NOTE — Telephone Encounter (Signed)
Pt called and states phx advised her that Temazepam was no longer on her formulary. There is no documentation that her temazepam has been discontinued. Advised pt I would request MD to refill temazepam. She verbalized thanks and understanding.

## 2022-03-27 DIAGNOSIS — C7951 Secondary malignant neoplasm of bone: Secondary | ICD-10-CM | POA: Diagnosis not present

## 2022-03-27 DIAGNOSIS — I10 Essential (primary) hypertension: Secondary | ICD-10-CM | POA: Diagnosis not present

## 2022-03-27 DIAGNOSIS — C50911 Malignant neoplasm of unspecified site of right female breast: Secondary | ICD-10-CM | POA: Diagnosis not present

## 2022-03-27 DIAGNOSIS — C787 Secondary malignant neoplasm of liver and intrahepatic bile duct: Secondary | ICD-10-CM | POA: Diagnosis not present

## 2022-03-27 DIAGNOSIS — C50912 Malignant neoplasm of unspecified site of left female breast: Secondary | ICD-10-CM | POA: Diagnosis not present

## 2022-03-27 DIAGNOSIS — I871 Compression of vein: Secondary | ICD-10-CM | POA: Diagnosis not present

## 2022-03-29 ENCOUNTER — Other Ambulatory Visit: Payer: Self-pay

## 2022-03-29 ENCOUNTER — Ambulatory Visit: Payer: Medicare Other

## 2022-03-29 ENCOUNTER — Other Ambulatory Visit: Payer: Medicare Other

## 2022-03-29 DIAGNOSIS — C50912 Malignant neoplasm of unspecified site of left female breast: Secondary | ICD-10-CM | POA: Diagnosis not present

## 2022-03-29 DIAGNOSIS — C7951 Secondary malignant neoplasm of bone: Secondary | ICD-10-CM | POA: Diagnosis not present

## 2022-03-29 DIAGNOSIS — I10 Essential (primary) hypertension: Secondary | ICD-10-CM | POA: Diagnosis not present

## 2022-03-29 DIAGNOSIS — C50911 Malignant neoplasm of unspecified site of right female breast: Secondary | ICD-10-CM | POA: Diagnosis not present

## 2022-03-29 DIAGNOSIS — C787 Secondary malignant neoplasm of liver and intrahepatic bile duct: Secondary | ICD-10-CM | POA: Diagnosis not present

## 2022-03-29 DIAGNOSIS — I871 Compression of vein: Secondary | ICD-10-CM | POA: Diagnosis not present

## 2022-03-30 ENCOUNTER — Telehealth: Payer: Self-pay | Admitting: *Deleted

## 2022-03-30 DIAGNOSIS — Z79891 Long term (current) use of opiate analgesic: Secondary | ICD-10-CM | POA: Diagnosis not present

## 2022-03-30 DIAGNOSIS — Z7951 Long term (current) use of inhaled steroids: Secondary | ICD-10-CM | POA: Diagnosis not present

## 2022-03-30 DIAGNOSIS — C787 Secondary malignant neoplasm of liver and intrahepatic bile duct: Secondary | ICD-10-CM | POA: Diagnosis not present

## 2022-03-30 DIAGNOSIS — Z9181 History of falling: Secondary | ICD-10-CM | POA: Diagnosis not present

## 2022-03-30 DIAGNOSIS — M1990 Unspecified osteoarthritis, unspecified site: Secondary | ICD-10-CM | POA: Diagnosis not present

## 2022-03-30 DIAGNOSIS — C50911 Malignant neoplasm of unspecified site of right female breast: Secondary | ICD-10-CM | POA: Diagnosis not present

## 2022-03-30 DIAGNOSIS — K219 Gastro-esophageal reflux disease without esophagitis: Secondary | ICD-10-CM | POA: Diagnosis not present

## 2022-03-30 DIAGNOSIS — I10 Essential (primary) hypertension: Secondary | ICD-10-CM | POA: Diagnosis not present

## 2022-03-30 DIAGNOSIS — C50912 Malignant neoplasm of unspecified site of left female breast: Secondary | ICD-10-CM | POA: Diagnosis not present

## 2022-03-30 DIAGNOSIS — Z7901 Long term (current) use of anticoagulants: Secondary | ICD-10-CM | POA: Diagnosis not present

## 2022-03-30 DIAGNOSIS — C7951 Secondary malignant neoplasm of bone: Secondary | ICD-10-CM | POA: Diagnosis not present

## 2022-03-30 DIAGNOSIS — Z7952 Long term (current) use of systemic steroids: Secondary | ICD-10-CM | POA: Diagnosis not present

## 2022-03-30 DIAGNOSIS — Z9013 Acquired absence of bilateral breasts and nipples: Secondary | ICD-10-CM | POA: Diagnosis not present

## 2022-03-30 DIAGNOSIS — I871 Compression of vein: Secondary | ICD-10-CM | POA: Diagnosis not present

## 2022-03-30 DIAGNOSIS — Z5181 Encounter for therapeutic drug level monitoring: Secondary | ICD-10-CM | POA: Diagnosis not present

## 2022-03-30 NOTE — Telephone Encounter (Signed)
Received call from [name] [phone]) stating pt INR today is 3.7 while on Warfarin 5 mg p.o daily.  Per MD pt needing to take Warfarin 2.5 mg 3 days a week (M,W,F) and Warfarin 5 mg 4 days a week and recheck in 1 week.  Instructions provided to Yolanda Hammock, RN who states she will educate dpt and recheck in 1 week.

## 2022-04-03 ENCOUNTER — Encounter (HOSPITAL_COMMUNITY)
Admission: RE | Admit: 2022-04-03 | Discharge: 2022-04-03 | Disposition: A | Discharge status: Home / Self Care | Payer: Medicare Other | Source: Ambulatory Visit | Attending: Hematology and Oncology | Admitting: Hematology and Oncology

## 2022-04-03 ENCOUNTER — Ambulatory Visit (HOSPITAL_COMMUNITY)
Admission: RE | Admit: 2022-04-03 | Discharge: 2022-04-03 | Disposition: A | Discharge status: Home / Self Care | Payer: Medicare Other | Source: Ambulatory Visit | Attending: Hematology and Oncology | Admitting: Hematology and Oncology

## 2022-04-03 DIAGNOSIS — I700 Atherosclerosis of aorta: Secondary | ICD-10-CM | POA: Diagnosis not present

## 2022-04-03 DIAGNOSIS — M19071 Primary osteoarthritis, right ankle and foot: Secondary | ICD-10-CM | POA: Diagnosis not present

## 2022-04-03 DIAGNOSIS — N281 Cyst of kidney, acquired: Secondary | ICD-10-CM | POA: Diagnosis not present

## 2022-04-03 DIAGNOSIS — C50911 Malignant neoplasm of unspecified site of right female breast: Secondary | ICD-10-CM | POA: Diagnosis not present

## 2022-04-03 DIAGNOSIS — J701 Chronic and other pulmonary manifestations due to radiation: Secondary | ICD-10-CM | POA: Diagnosis not present

## 2022-04-03 DIAGNOSIS — Z5111 Encounter for antineoplastic chemotherapy: Secondary | ICD-10-CM | POA: Diagnosis not present

## 2022-04-03 DIAGNOSIS — K5730 Diverticulosis of large intestine without perforation or abscess without bleeding: Secondary | ICD-10-CM | POA: Diagnosis not present

## 2022-04-03 DIAGNOSIS — D259 Leiomyoma of uterus, unspecified: Secondary | ICD-10-CM | POA: Diagnosis not present

## 2022-04-03 DIAGNOSIS — M19011 Primary osteoarthritis, right shoulder: Secondary | ICD-10-CM | POA: Diagnosis not present

## 2022-04-03 DIAGNOSIS — J941 Fibrothorax: Secondary | ICD-10-CM | POA: Diagnosis not present

## 2022-04-03 DIAGNOSIS — C50919 Malignant neoplasm of unspecified site of unspecified female breast: Secondary | ICD-10-CM | POA: Diagnosis not present

## 2022-04-03 MED ORDER — IOHEXOL 9 MG/ML PO SOLN: 1000.0000 mL | ORAL | Status: DC

## 2022-04-03 MED ORDER — IOHEXOL 9 MG/ML PO SOLN
ORAL | Status: DC
Start: 2022-04-03 — End: 2022-04-03
  Filled 2022-04-03: qty 1000

## 2022-04-03 MED ORDER — IOHEXOL 300 MG/ML  SOLN
100.0000 mL | Freq: Once | INTRAMUSCULAR | Status: AC | PRN
  Administered 2022-04-03: 100 mL via INTRAVENOUS

## 2022-04-03 MED ORDER — TECHNETIUM TC 99M MEDRONATE IV KIT
20.0000 | PACK | Freq: Once | INTRAVENOUS | Status: AC | PRN
  Administered 2022-04-03: 22 via INTRAVENOUS

## 2022-04-03 MED ORDER — SODIUM CHLORIDE (PF) 0.9 % IJ SOLN
INTRAMUSCULAR | Status: AC
  Filled 2022-04-03: qty 50

## 2022-04-04 NOTE — Assessment & Plan Note (Signed)
2004: Liver. Lung and bone mets Neoadj chemo TCH X 6completed April 20100fll by herceptin maintenance  bilateral mastectomieswith bilateral axillary lymph node dissection 12/07/2004, showing (a) on the right, a mypT1c ypN1 invasive ductal carcinoma, grade 3, estrogen and progesterone receptor negative, HER-2 positive, with an MIB-1 of 31% (b) on the left, ypT2 ypN1 invasive ductal carcinoma, grade 2, estrogen and progesterone receptor negative, HER-2 positive, with an MIB-1 of 35%.  Adj XRT  Current treatment:Herceptin maintenance therapy Plan to continue Herceptin indefinitely every 28 days.  Breast cancer Surveillance: CT chest 03/27/2021: Status post bilateral mastectomy, unchanged appearances subcutaneous soft tissue. No evidence of metastatic breast cancer recurrence. Subpleural radiation fibrosis upper lobes  CT chest abdomen and pelvis 04/04/2022: New soft tissue nodule suggestive of a postoperative fluid collection measuring 0.9 cm concerning for local recurrence.  No other evidence of metastatic disease. Recommendation: Surgical excision biopsy of the subcutaneous nodules  SVC syndrome: On lifelong anticoagulation.  Currently on Coumadin because the Xarelto was not covered by her insurance.  Today's INR is 4.6 and therefore she will hold anticoagulation for 2 days and then resume it at 5 mg daily.  Joint stiffness and achiness: Related to osteoarthritis along with menopausal symptoms: I discussed with her about stretching and yoga for exercise.

## 2022-04-05 ENCOUNTER — Inpatient Hospital Stay: Payer: Medicare Other

## 2022-04-05 ENCOUNTER — Other Ambulatory Visit: Payer: Self-pay

## 2022-04-05 ENCOUNTER — Inpatient Hospital Stay: Payer: Medicare Other | Attending: Oncology

## 2022-04-05 ENCOUNTER — Inpatient Hospital Stay (HOSPITAL_BASED_OUTPATIENT_CLINIC_OR_DEPARTMENT_OTHER): Payer: Medicare Other | Admitting: Hematology and Oncology

## 2022-04-05 VITALS — BP 117/67 | HR 80 | Temp 98.7°F | Resp 18

## 2022-04-05 VITALS — BP 157/88 | HR 94 | Temp 98.1°F | Resp 18 | Ht 63.0 in | Wt 252.2 lb

## 2022-04-05 DIAGNOSIS — C50011 Malignant neoplasm of nipple and areola, right female breast: Secondary | ICD-10-CM

## 2022-04-05 DIAGNOSIS — C50919 Malignant neoplasm of unspecified site of unspecified female breast: Secondary | ICD-10-CM

## 2022-04-05 DIAGNOSIS — Z7901 Long term (current) use of anticoagulants: Secondary | ICD-10-CM | POA: Insufficient documentation

## 2022-04-05 DIAGNOSIS — C7951 Secondary malignant neoplasm of bone: Secondary | ICD-10-CM | POA: Insufficient documentation

## 2022-04-05 DIAGNOSIS — M25512 Pain in left shoulder: Secondary | ICD-10-CM | POA: Diagnosis not present

## 2022-04-05 DIAGNOSIS — C50911 Malignant neoplasm of unspecified site of right female breast: Secondary | ICD-10-CM | POA: Insufficient documentation

## 2022-04-05 DIAGNOSIS — I871 Compression of vein: Secondary | ICD-10-CM

## 2022-04-05 DIAGNOSIS — C7800 Secondary malignant neoplasm of unspecified lung: Secondary | ICD-10-CM | POA: Diagnosis not present

## 2022-04-05 DIAGNOSIS — Z5112 Encounter for antineoplastic immunotherapy: Secondary | ICD-10-CM | POA: Insufficient documentation

## 2022-04-05 DIAGNOSIS — C50812 Malignant neoplasm of overlapping sites of left female breast: Secondary | ICD-10-CM | POA: Insufficient documentation

## 2022-04-05 DIAGNOSIS — Z171 Estrogen receptor negative status [ER-]: Secondary | ICD-10-CM | POA: Diagnosis not present

## 2022-04-05 DIAGNOSIS — Z95828 Presence of other vascular implants and grafts: Secondary | ICD-10-CM

## 2022-04-05 LAB — CBC WITH DIFFERENTIAL (CANCER CENTER ONLY)
Abs Immature Granulocytes: 0.02 10*3/uL (ref 0.00–0.07)
Basophils Absolute: 0.1 10*3/uL (ref 0.0–0.1)
Basophils Relative: 1 %
Eosinophils Absolute: 0.1 10*3/uL (ref 0.0–0.5)
Eosinophils Relative: 1 %
HCT: 34.6 % — ABNORMAL LOW (ref 36.0–46.0)
Hemoglobin: 12 g/dL (ref 12.0–15.0)
Immature Granulocytes: 0 %
Lymphocytes Relative: 18 %
Lymphs Abs: 1.6 10*3/uL (ref 0.7–4.0)
MCH: 27.2 pg (ref 26.0–34.0)
MCHC: 34.7 g/dL (ref 30.0–36.0)
MCV: 78.5 fL — ABNORMAL LOW (ref 80.0–100.0)
Monocytes Absolute: 0.5 10*3/uL (ref 0.1–1.0)
Monocytes Relative: 6 %
Neutro Abs: 6.5 10*3/uL (ref 1.7–7.7)
Neutrophils Relative %: 74 %
Platelet Count: 266 10*3/uL (ref 150–400)
RBC: 4.41 MIL/uL (ref 3.87–5.11)
RDW: 14.6 % (ref 11.5–15.5)
WBC Count: 8.8 10*3/uL (ref 4.0–10.5)
nRBC: 0 % (ref 0.0–0.2)

## 2022-04-05 LAB — CMP (CANCER CENTER ONLY)
ALT: 9 U/L (ref 0–44)
AST: 12 U/L — ABNORMAL LOW (ref 15–41)
Albumin: 4.2 g/dL (ref 3.5–5.0)
Alkaline Phosphatase: 77 U/L (ref 38–126)
Anion gap: 7 (ref 5–15)
BUN: 19 mg/dL (ref 8–23)
CO2: 28 mmol/L (ref 22–32)
Calcium: 9.3 mg/dL (ref 8.9–10.3)
Chloride: 105 mmol/L (ref 98–111)
Creatinine: 0.85 mg/dL (ref 0.44–1.00)
GFR, Estimated: >60 mL/min (ref >60)
Glucose, Bld: 102 mg/dL — ABNORMAL HIGH (ref 70–99)
Potassium: 4.2 mmol/L (ref 3.5–5.1)
Sodium: 140 mmol/L (ref 135–145)
Total Bilirubin: 0.9 mg/dL (ref 0.3–1.2)
Total Protein: 8.1 g/dL (ref 6.5–8.1)

## 2022-04-05 MED ORDER — TRASTUZUMAB-DKST CHEMO 150 MG IV SOLR
750.0000 mg | Freq: Once | INTRAVENOUS | Status: AC
  Administered 2022-04-05: 750 mg via INTRAVENOUS
  Filled 2022-04-05: qty 35.72

## 2022-04-05 MED ORDER — DIPHENHYDRAMINE HCL 25 MG PO CAPS
ORAL_CAPSULE | ORAL | Status: AC
  Filled 2022-04-05: qty 1

## 2022-04-05 MED ORDER — LORAZEPAM 2 MG/ML IJ SOLN
INTRAMUSCULAR | Status: AC
  Filled 2022-04-05: qty 1

## 2022-04-05 MED ORDER — HEPARIN SOD (PORK) LOCK FLUSH 100 UNIT/ML IV SOLN
500.0000 [IU] | Freq: Once | INTRAVENOUS | Status: AC | PRN
  Administered 2022-04-05: 500 [IU]

## 2022-04-05 MED ORDER — LORAZEPAM 2 MG/ML IJ SOLN
1.0000 mg | Freq: Once | INTRAMUSCULAR | Status: AC | PRN
  Administered 2022-04-05: 1 mg via INTRAVENOUS

## 2022-04-05 MED ORDER — ACETAMINOPHEN 325 MG PO TABS
ORAL_TABLET | ORAL | Status: AC
  Filled 2022-04-05: qty 2

## 2022-04-05 MED ORDER — SODIUM CHLORIDE 0.9% FLUSH
10.0000 mL | INTRAVENOUS | Status: DC | PRN
  Administered 2022-04-05: 10 mL

## 2022-04-05 MED ORDER — DIPHENHYDRAMINE HCL 25 MG PO CAPS
25.0000 mg | ORAL_CAPSULE | Freq: Once | ORAL | Status: AC
  Administered 2022-04-05: 25 mg via ORAL

## 2022-04-05 MED ORDER — ACETAMINOPHEN 325 MG PO TABS
650.0000 mg | ORAL_TABLET | Freq: Once | ORAL | Status: AC
  Administered 2022-04-05: 650 mg via ORAL

## 2022-04-05 MED ORDER — SODIUM CHLORIDE 0.9 % IV SOLN: Freq: Once | INTRAVENOUS | Status: AC

## 2022-04-05 NOTE — Patient Instructions (Signed)
[facility] MEDICAL ONCOLOGY  Discharge Instructions: Thank you for choosing [facility] to provide your oncology and hematology care.   If you have a lab appointment with the Cancer Center, please go directly to the Cancer Center and check in at the registration area.   Wear comfortable clothing and clothing appropriate for easy access to any Portacath or PICC line.   We strive to give you quality time with your provider. You may need to reschedule your appointment if you arrive late (15 or more minutes).  Arriving late affects you and other patients whose appointments are after yours.  Also, if you miss three or more appointments without notifying the office, you may be dismissed from the clinic at the provider's discretion.      For prescription refill requests, have your pharmacy contact our office and allow 72 hours for refills to be completed.    Today you received the following chemotherapy and/or immunotherapy agents: Ogivri      To help prevent nausea and vomiting after your treatment, we encourage you to take your nausea medication as directed.  BELOW ARE SYMPTOMS THAT SHOULD BE REPORTED IMMEDIATELY: *FEVER GREATER THAN 100.4 F (38 C) OR HIGHER *CHILLS OR SWEATING *NAUSEA AND VOMITING THAT IS NOT CONTROLLED WITH YOUR NAUSEA MEDICATION *UNUSUAL SHORTNESS OF BREATH *UNUSUAL BRUISING OR BLEEDING *URINARY PROBLEMS (pain or burning when urinating, or frequent urination) *BOWEL PROBLEMS (unusual diarrhea, constipation, pain near the anus) TENDERNESS IN MOUTH AND THROAT WITH OR WITHOUT PRESENCE OF ULCERS (sore throat, sores in mouth, or a toothache) UNUSUAL RASH, SWELLING OR PAIN  UNUSUAL VAGINAL DISCHARGE OR ITCHING   Items with * indicate a potential emergency and should be followed up as soon as possible or go to the Emergency Department if any problems should occur.  Please show the CHEMOTHERAPY ALERT CARD or IMMUNOTHERAPY ALERT CARD at check-in to the  Emergency Department and triage nurse.  Should you have questions after your visit or need to cancel or reschedule your appointment, please contact [facility]  Dept: [phone]  and follow the prompts.  Office hours are 8:00 a.m. to 4:30 p.m. Monday - Friday. Please note that voicemails left after 4:00 p.m. may not be returned until the following business day.  We are closed weekends and major holidays. You have access to a nurse at all times for urgent questions. Please call the main number to the clinic Dept: [phone] and follow the prompts.   For any non-urgent questions, you may also contact your provider using MyChart. We now offer e-Visits for anyone 18 and older to request care online for non-urgent symptoms. For details visit [email or web address].   Also download the MyChart app! Go to the app store, search "MyChart", open the app, select [facility], and log in with your MyChart username and password.  Masks are optional in the cancer centers. If you would like for your care team to wear a mask while they are taking care of you, please let them know. You may have one support person who is at least 70 years old accompany you for your appointments. 

## 2022-04-05 NOTE — Progress Notes (Signed)
Patient Care Team: Ernestene Kiel, MD as PCP - General (Internal Medicine) Haze Rushing., MD as Consulting Physician (Pain Medicine) Joya Salm, MD as Referring Physician (Orthopedic Surgery) Jaymes Graff, DO as Consulting Physician (Orthopedic Surgery) Larey Dresser, MD as Consulting Physician (Cardiology) Bensimhon, Shaune Pascal, MD as Consulting Physician (Cardiology) Lorelle Gibbs, MD (Radiology) Tommy Medal, Lavell Islam, MD as Consulting Physician (Infectious Diseases) Gery Pray, MD as Consulting Physician (Radiation Oncology) Nicholas Lose, MD as Consulting Physician (Hematology and Oncology)  DIAGNOSIS:  Encounter Diagnosis  Name Primary?   Carcinoma of right breast metastatic to multiple sites ([place])     SUMMARY OF ONCOLOGIC HISTORY: Oncology History  Breast cancer metastasized to multiple sites [facility])  02/26/2013 Initial Diagnosis   history of inflammatory right breast cancer metastatic at presentation September 2004 with involvement of liver and bone, HER-2 positive, estrogen and progesterone receptor negative      - 11/2013 Chemotherapy   carboplatin, docetaxel and Herceptin x 6 completed April 2005     Surgery   bilateral mastectomies with bilateral axillary lymph node dissection 12/07/2004, showing             (a) on the right, a mypT1c ypN1 invasive ductal carcinoma, grade 3, estrogen and progesterone receptor negative, HER-2 positive, with an MIB-1 of 31%             (b) on the left, ypT2 ypN1 invasive ductal carcinoma, grade 2, estrogen and progesterone receptor negative, HER-2 positive, with an MIB-1 of 35%.   01/2015 - 02/2015 Radiation Therapy   Adj XRT    - 03/2018 Chemotherapy   Ixampra     CHIEF COMPLIANT: Follow-up on Herceptin for metastatic breast cancer, to review recent scans  INTERVAL HISTORY: [name] is a 71 year old with above-mentioned history metastatic breast cancer who has been on Herceptin maintenance and appears to  be doing quite well.  She underwent recent CT scans and is here today to discuss those results as well.  She is complaining of left shoulder pain and discomfort.   ALLERGIES:  is allergic to penicillins and adhesive [tape].  MEDICATIONS:  Current Outpatient Medications  Medication Sig Dispense Refill   acetaminophen (TYLENOL) 500 MG tablet Take 1,000 mg by mouth every 6 (six) hours as needed for mild pain or fever.      albuterol (PROVENTIL HFA;VENTOLIN HFA) 108 (90 BASE) MCG/ACT inhaler Inhale 2 puffs into the lungs every 6 (six) hours as needed for wheezing. 1 Inhaler 5   ALPRAZolam (XANAX) 1 MG tablet Take 1 tablet (1 mg total) by mouth as needed for anxiety. 30 tablet 3   amLODipine (NORVASC) 10 MG tablet Take 1 tablet (10 mg total) by mouth every morning. 30 tablet 4   baclofen (LIORESAL) 10 MG tablet TAKE 1 TABLET BY MOUTH THREE TIMES DAILY AS NEEDED FOR MUSCLE SPASMS 270 tablet 0   carvedilol (COREG) 12.5 MG tablet TAKE 1 TABLET(12.5 MG) BY MOUTH TWICE DAILY 60 tablet 11   diclofenac sodium (VOLTAREN) 1 % GEL Apply 2 g topically daily as needed (for pain). Apply to knees and shoulders 100 g 6   DULoxetine (CYMBALTA) 20 MG capsule Take 20 mg by mouth daily.     furosemide (LASIX) 40 MG tablet Take 40 mg by mouth as needed.     gabapentin (NEURONTIN) 300 MG capsule TAKE 2 CAPSULES BY MOUTH THREE TIMES DAILY 540 capsule 0   losartan (COZAAR) 100 MG tablet Take 100 mg by mouth every morning.  oxyCODONE-acetaminophen (PERCOCET) 10-325 MG tablet Take 1 tablet by mouth 4 (four) times daily as needed.     potassium chloride SA (KLOR-CON) 20 MEQ tablet Take 1 tablet (20 mEq total) by mouth daily as needed. With use of lasix 30 tablet 3   predniSONE (DELTASONE) 5 MG tablet Take 5 mg by mouth daily in the afternoon.     spironolactone (ALDACTONE) 25 MG tablet TAKE 1 TABLET(25 MG) BY MOUTH DAILY 30 tablet 11   temazepam (RESTORIL) 30 MG capsule Take 1 capsule (30 mg total) by mouth at bedtime.  30 capsule 3   warfarin (COUMADIN) 2.5 MG tablet Take 1 tablet (2.5 mg total) by mouth daily. 30 tablet 3   warfarin (COUMADIN) 5 MG tablet Take 1 tablet (5 mg total) by mouth daily. 30 tablet 3   No current facility-administered medications for this visit.   Facility-Administered Medications Ordered in Other Visits  Medication Dose Route Frequency Provider Last Rate Last Admin   acetaminophen (TYLENOL) 325 MG tablet            diphenhydrAMINE (BENADRYL) 25 mg capsule            heparin lock flush 100 unit/mL  500 Units Intracatheter Once PRN Nicholas Lose, MD       LORazepam (ATIVAN) 2 MG/ML injection            sodium chloride flush (NS) 0.9 % injection 10 mL  10 mL Intravenous PRN Magrinat, Virgie Dad, MD   10 mL at 12/15/15 1200   sodium chloride flush (NS) 0.9 % injection 10 mL  10 mL Intracatheter PRN Magrinat, Virgie Dad, MD   10 mL at 08/14/18 1116   sodium chloride flush (NS) 0.9 % injection 10 mL  10 mL Intracatheter PRN Magrinat, Virgie Dad, MD       sodium chloride flush (NS) 0.9 % injection 10 mL  10 mL Intracatheter PRN Nicholas Lose, MD       trastuzumab-dkst (OGIVRI) 750 mg in sodium chloride 0.9 % 250 mL chemo infusion  750 mg Intravenous Once Nicholas Lose, MD        PHYSICAL EXAMINATION: ECOG PERFORMANCE STATUS: 1 - Symptomatic but completely ambulatory  Vitals:   04/05/22 1106  BP: (!) 157/88  Pulse: 94  Resp: 18  Temp: 98.1 F (36.7 C)  SpO2: 96%   Filed Weights   04/05/22 1106  Weight: 252 lb 3.2 oz (114.4 kg)   Left chest wall: There is a small palpable nodule in the lateral aspect of the left chest wall  LABORATORY DATA:  I have reviewed the data as listed    Latest Ref Rng & Units 04/05/2022   10:37 AM 03/08/2022   10:32 AM 02/08/2022   10:32 AM  CMP  Glucose 70 - 99 mg/dL 102  101  105   BUN 8 - 23 mg/dL '19  14  22   ' Creatinine 0.44 - 1.00 mg/dL 0.85  1.00  0.86   Sodium 135 - 145 mmol/L 140  143  139   Potassium 3.5 - 5.1 mmol/L 4.2  3.7  4.6    Chloride 98 - 111 mmol/L 105  107  105   CO2 22 - 32 mmol/L '28  30  29   ' Calcium 8.9 - 10.3 mg/dL 9.3  9.3  9.5   Total Protein 6.5 - 8.1 g/dL 8.1  7.1  7.7   Total Bilirubin 0.3 - 1.2 mg/dL 0.9  0.8  0.8   Alkaline Phos 38 -  126 U/L 77  69  74   AST 15 - 41 U/L 12  59  10   ALT 0 - 44 U/L 9  60  10     Lab Results  Component Value Date   WBC 8.8 04/05/2022   HGB 12.0 04/05/2022   HCT 34.6 (L) 04/05/2022   MCV 78.5 (L) 04/05/2022   PLT 266 04/05/2022   NEUTROABS 6.5 04/05/2022    ASSESSMENT & PLAN:  Breast cancer metastasized to multiple sites 2004: Liver. Lung and bone mets Neoadj chemo TCH X 6 completed April 2005 foll by herceptin maintenance   bilateral mastectomies with bilateral axillary lymph node dissection 12/07/2004, showing             (a) on the right, a mypT1c ypN1 invasive ductal carcinoma, grade 3, estrogen and progesterone receptor negative, HER-2 positive, with an MIB-1 of 31%             (b) on the left, ypT2 ypN1 invasive ductal carcinoma, grade 2, estrogen and progesterone receptor negative, HER-2 positive, with an MIB-1 of 35%.   Adj XRT   Current treatment: Herceptin maintenance therapy Plan to continue Herceptin indefinitely every 28 days.   Breast cancer Surveillance: CT chest 03/27/2021: Status post bilateral mastectomy, unchanged appearances subcutaneous soft tissue.  No evidence of metastatic breast cancer recurrence.  Subpleural radiation fibrosis upper lobes  CT chest abdomen and pelvis 04/04/2022: New soft tissue nodule suggestive of a postoperative fluid collection measuring 0.9 cm concerning for local recurrence.  No other evidence of metastatic disease. Recommendation: Surgical excision biopsy of the subcutaneous nodules.  I will request surgery consultation.   SVC syndrome: On lifelong anticoagulation.  Currently on Coumadin because the Xarelto was not covered by her insurance.  Today's INR is     Joint stiffness and achiness: Related to  osteoarthritis along with menopausal symptoms: I discussed with her about stretching and yoga for exercise. Left shoulder pain: Will obtain an MRI of the left shoulder.  Return monthly for Herceptin infusions.  No orders of the defined types were placed in this encounter.  The patient has a good understanding of the overall plan. she agrees with it. she will call with any problems that may develop before the next visit here. Total time spent: 30 mins including face to face time and time spent for planning, charting and co-ordination of care   Harriette Ohara, MD 04/05/22

## 2022-04-06 ENCOUNTER — Telehealth: Payer: Self-pay | Admitting: *Deleted

## 2022-04-06 DIAGNOSIS — K219 Gastro-esophageal reflux disease without esophagitis: Secondary | ICD-10-CM | POA: Diagnosis not present

## 2022-04-06 DIAGNOSIS — C7951 Secondary malignant neoplasm of bone: Secondary | ICD-10-CM | POA: Diagnosis not present

## 2022-04-06 DIAGNOSIS — I10 Essential (primary) hypertension: Secondary | ICD-10-CM | POA: Diagnosis not present

## 2022-04-06 DIAGNOSIS — C50912 Malignant neoplasm of unspecified site of left female breast: Secondary | ICD-10-CM | POA: Diagnosis not present

## 2022-04-06 DIAGNOSIS — C787 Secondary malignant neoplasm of liver and intrahepatic bile duct: Secondary | ICD-10-CM | POA: Diagnosis not present

## 2022-04-06 DIAGNOSIS — C50911 Malignant neoplasm of unspecified site of right female breast: Secondary | ICD-10-CM | POA: Diagnosis not present

## 2022-04-06 NOTE — Telephone Encounter (Signed)
Received call from [facility] [phone]) reporting pt INR today is 2.3.   Per MD pt needing continue current therapy with Warfarin 2.5 mg 3 days a week (M,W,F) and Warfarin 5 mg 4 days a week and recheck in 1 week.  Instructions provided to Lavella Hammock, RN who states she will educate dpt and recheck in 1 week.

## 2022-04-10 ENCOUNTER — Encounter: Payer: Self-pay | Admitting: *Deleted

## 2022-04-10 ENCOUNTER — Other Ambulatory Visit: Payer: Self-pay

## 2022-04-10 NOTE — Progress Notes (Signed)
RN sucessfully faxed port a cath assess order to [facility] for pt upcoming MRI.

## 2022-04-12 ENCOUNTER — Telehealth: Payer: Self-pay | Admitting: Hematology and Oncology

## 2022-04-12 NOTE — Telephone Encounter (Signed)
Rescheduled appointment per 8/11 staff message. Patient is aware of the changes made to her upcoming appointment.

## 2022-04-13 ENCOUNTER — Telehealth: Payer: Self-pay | Admitting: *Deleted

## 2022-04-13 DIAGNOSIS — I10 Essential (primary) hypertension: Secondary | ICD-10-CM | POA: Diagnosis not present

## 2022-04-13 DIAGNOSIS — C50912 Malignant neoplasm of unspecified site of left female breast: Secondary | ICD-10-CM | POA: Diagnosis not present

## 2022-04-13 DIAGNOSIS — K219 Gastro-esophageal reflux disease without esophagitis: Secondary | ICD-10-CM | POA: Diagnosis not present

## 2022-04-13 DIAGNOSIS — C50911 Malignant neoplasm of unspecified site of right female breast: Secondary | ICD-10-CM | POA: Diagnosis not present

## 2022-04-13 DIAGNOSIS — C787 Secondary malignant neoplasm of liver and intrahepatic bile duct: Secondary | ICD-10-CM | POA: Diagnosis not present

## 2022-04-13 DIAGNOSIS — C7951 Secondary malignant neoplasm of bone: Secondary | ICD-10-CM | POA: Diagnosis not present

## 2022-04-13 NOTE — Telephone Encounter (Signed)
Samantha from [facility], called pt INR 2.5 and PT 30 secs.

## 2022-04-17 ENCOUNTER — Telehealth: Payer: Self-pay

## 2022-04-17 NOTE — Telephone Encounter (Signed)
-----   Message from Merril Abbe, LPN sent at [phone]  2:56 PM EDT ----- Per Aldona Bar from [facility] called with pt results =PT 30 secs INR 2.5 Aldona Bar [phone]

## 2022-04-17 NOTE — Telephone Encounter (Signed)
S/w Aldona Bar, RN in regards to pt's most recent PT/INR. Per MD, pt should continue previous doses of Warfarin as she is therapeutic.Warfarin 2.5 mg MWF, Warfarin 5 mg 4 days a week. Samantha verbalized thanks and understanding and will relay to pt.

## 2022-04-20 ENCOUNTER — Telehealth: Payer: Self-pay | Admitting: *Deleted

## 2022-04-20 DIAGNOSIS — C50911 Malignant neoplasm of unspecified site of right female breast: Secondary | ICD-10-CM | POA: Diagnosis not present

## 2022-04-20 DIAGNOSIS — I10 Essential (primary) hypertension: Secondary | ICD-10-CM | POA: Diagnosis not present

## 2022-04-20 DIAGNOSIS — C50912 Malignant neoplasm of unspecified site of left female breast: Secondary | ICD-10-CM | POA: Diagnosis not present

## 2022-04-20 DIAGNOSIS — C787 Secondary malignant neoplasm of liver and intrahepatic bile duct: Secondary | ICD-10-CM | POA: Diagnosis not present

## 2022-04-20 DIAGNOSIS — C7951 Secondary malignant neoplasm of bone: Secondary | ICD-10-CM | POA: Diagnosis not present

## 2022-04-20 DIAGNOSIS — K219 Gastro-esophageal reflux disease without esophagitis: Secondary | ICD-10-CM | POA: Diagnosis not present

## 2022-04-20 NOTE — Telephone Encounter (Signed)
Received call from [place] stating pt INR 3.3 today.  Pt currently taking Warfarin 2.5 mg MWF and 5 mg T,TH,SAT,SUN.  MD out of office and RN reviewed with Dede Query, PA.  Verbal orders received for pt to take Warfarin 2.5 mg this Saturday and Sunday and recheck Monday 8/28.  Aldona Bar, RN verbalized understanding and states she will educate pt on instructions.

## 2022-04-23 DIAGNOSIS — C50911 Malignant neoplasm of unspecified site of right female breast: Secondary | ICD-10-CM | POA: Diagnosis not present

## 2022-04-23 DIAGNOSIS — K219 Gastro-esophageal reflux disease without esophagitis: Secondary | ICD-10-CM | POA: Diagnosis not present

## 2022-04-23 DIAGNOSIS — C50912 Malignant neoplasm of unspecified site of left female breast: Secondary | ICD-10-CM | POA: Diagnosis not present

## 2022-04-23 DIAGNOSIS — C787 Secondary malignant neoplasm of liver and intrahepatic bile duct: Secondary | ICD-10-CM | POA: Diagnosis not present

## 2022-04-23 DIAGNOSIS — C7951 Secondary malignant neoplasm of bone: Secondary | ICD-10-CM | POA: Diagnosis not present

## 2022-04-23 DIAGNOSIS — I10 Essential (primary) hypertension: Secondary | ICD-10-CM | POA: Diagnosis not present

## 2022-04-24 ENCOUNTER — Ambulatory Visit
Admission: RE | Admit: 2022-04-24 | Discharge: 2022-04-24 | Disposition: A | Discharge status: Home / Self Care | Payer: Medicare Other | Source: Ambulatory Visit | Attending: Hematology and Oncology | Admitting: Hematology and Oncology

## 2022-04-24 ENCOUNTER — Telehealth: Payer: Self-pay | Admitting: *Deleted

## 2022-04-24 DIAGNOSIS — C50911 Malignant neoplasm of unspecified site of right female breast: Secondary | ICD-10-CM

## 2022-04-24 DIAGNOSIS — M25512 Pain in left shoulder: Secondary | ICD-10-CM

## 2022-04-24 DIAGNOSIS — R600 Localized edema: Secondary | ICD-10-CM | POA: Diagnosis not present

## 2022-04-24 MED ORDER — HEPARIN SOD (PORK) LOCK FLUSH 100 UNIT/ML IV SOLN
500.0000 [IU] | Freq: Once | INTRAVENOUS | Status: AC
  Administered 2022-04-24: 500 [IU] via INTRAVENOUS

## 2022-04-24 MED ORDER — SODIUM CHLORIDE 0.9% FLUSH
10.0000 mL | INTRAVENOUS | Status: DC | PRN
  Administered 2022-04-24: 10 mL via INTRAVENOUS

## 2022-04-24 MED ORDER — GADOBENATE DIMEGLUMINE 529 MG/ML IV SOLN
20.0000 mL | Freq: Once | INTRAVENOUS | Status: AC | PRN
  Administered 2022-04-24: 20 mL via INTRAVENOUS

## 2022-04-24 NOTE — Telephone Encounter (Signed)
Received call from [place] with [facility] stating pt INR 3.1.  Per MD pt to take Warfarin 2.5 mg daily 5 days a week and hold Warfarin on Tuesday and Saturday.  Pt educated and verbalized understanding.

## 2022-04-26 ENCOUNTER — Ambulatory Visit: Payer: Medicare Other

## 2022-04-26 ENCOUNTER — Telehealth: Payer: Self-pay

## 2022-04-26 ENCOUNTER — Other Ambulatory Visit: Payer: Medicare Other

## 2022-04-26 ENCOUNTER — Encounter: Payer: Self-pay | Admitting: *Deleted

## 2022-04-26 DIAGNOSIS — I10 Essential (primary) hypertension: Secondary | ICD-10-CM | POA: Diagnosis not present

## 2022-04-26 DIAGNOSIS — K219 Gastro-esophageal reflux disease without esophagitis: Secondary | ICD-10-CM | POA: Diagnosis not present

## 2022-04-26 DIAGNOSIS — C50912 Malignant neoplasm of unspecified site of left female breast: Secondary | ICD-10-CM | POA: Diagnosis not present

## 2022-04-26 DIAGNOSIS — C50911 Malignant neoplasm of unspecified site of right female breast: Secondary | ICD-10-CM | POA: Diagnosis not present

## 2022-04-26 DIAGNOSIS — C7951 Secondary malignant neoplasm of bone: Secondary | ICD-10-CM | POA: Diagnosis not present

## 2022-04-26 DIAGNOSIS — C787 Secondary malignant neoplasm of liver and intrahepatic bile duct: Secondary | ICD-10-CM | POA: Diagnosis not present

## 2022-04-26 NOTE — Patient Outreach (Signed)
  Care Coordination   04/26/2022 Name: TAHEERA THOMANN MRN: [id number] DOB: 06-30-1951   Care Coordination Outreach Attempts:  An unsuccessful telephone outreach was attempted today to offer the patient information about available care coordination services as a benefit of their health plan.   Follow Up Plan:  Additional outreach attempts will be made to offer the patient care coordination information and services.   Encounter Outcome:  No Answer  Care Coordination Interventions Activated:  No   Care Coordination Interventions:  No, not indicated    Tomasa Rand, RN, BSN, CEN [facility] [employer] [phone]

## 2022-04-26 NOTE — Progress Notes (Signed)
Received call from [place] with [facility] [phone]) with INR 1.9 today.  Per MD pt needing to continue current plan of Warfarin 2.5 mg M,W,Th,Fri,Sun and holding Warfarin dose Tuesday and Saturday.  Pt educated and verbalized understanding.  Will re check in 1 week.

## 2022-04-26 NOTE — Telephone Encounter (Signed)
Updated number for [facility] RN [phone].

## 2022-04-27 ENCOUNTER — Ambulatory Visit: Payer: Self-pay | Admitting: Surgery

## 2022-04-27 ENCOUNTER — Encounter: Payer: Self-pay | Admitting: *Deleted

## 2022-04-27 ENCOUNTER — Other Ambulatory Visit: Payer: Self-pay | Admitting: *Deleted

## 2022-04-27 DIAGNOSIS — R222 Localized swelling, mass and lump, trunk: Secondary | ICD-10-CM | POA: Diagnosis not present

## 2022-04-27 MED ORDER — ENOXAPARIN SODIUM 100 MG/ML IJ SOSY: 1.0000 mg/kg | PREFILLED_SYRINGE | Freq: Two times a day (BID) | INTRAMUSCULAR | 0 refills | Status: DC

## 2022-04-27 MED ORDER — ENOXAPARIN SODIUM 120 MG/0.8ML IJ SOSY: 120.0000 mg | PREFILLED_SYRINGE | Freq: Two times a day (BID) | INTRAMUSCULAR | 0 refills | Status: DC

## 2022-04-27 NOTE — Progress Notes (Signed)
Received inbasket from [place], [place] with Dr. Donella Stade stating pt will be scheduled surgical excision of subcutaneous nodules. RN requesting recommendations on Warfarin.  Per MD pt needing to stop Warfarin 5 days prior to surgery and start Lovenox 1 mg/kg BID.  No anticoagulation the day of surgery and resume Warfarin day after surgery.  INR will be checked 4 days post surgery. Prescription sent to the pharmacy on file.  Pt educated to contact our office once surgery is scheduled. Pt verbalized understanding.

## 2022-04-29 DIAGNOSIS — C50911 Malignant neoplasm of unspecified site of right female breast: Secondary | ICD-10-CM | POA: Diagnosis not present

## 2022-04-29 DIAGNOSIS — I871 Compression of vein: Secondary | ICD-10-CM | POA: Diagnosis not present

## 2022-04-29 DIAGNOSIS — Z7952 Long term (current) use of systemic steroids: Secondary | ICD-10-CM | POA: Diagnosis not present

## 2022-04-29 DIAGNOSIS — Z7951 Long term (current) use of inhaled steroids: Secondary | ICD-10-CM | POA: Diagnosis not present

## 2022-04-29 DIAGNOSIS — Z5181 Encounter for therapeutic drug level monitoring: Secondary | ICD-10-CM | POA: Diagnosis not present

## 2022-04-29 DIAGNOSIS — I10 Essential (primary) hypertension: Secondary | ICD-10-CM | POA: Diagnosis not present

## 2022-04-29 DIAGNOSIS — Z9181 History of falling: Secondary | ICD-10-CM | POA: Diagnosis not present

## 2022-04-29 DIAGNOSIS — C787 Secondary malignant neoplasm of liver and intrahepatic bile duct: Secondary | ICD-10-CM | POA: Diagnosis not present

## 2022-04-29 DIAGNOSIS — M1990 Unspecified osteoarthritis, unspecified site: Secondary | ICD-10-CM | POA: Diagnosis not present

## 2022-04-29 DIAGNOSIS — C50912 Malignant neoplasm of unspecified site of left female breast: Secondary | ICD-10-CM | POA: Diagnosis not present

## 2022-04-29 DIAGNOSIS — Z7901 Long term (current) use of anticoagulants: Secondary | ICD-10-CM | POA: Diagnosis not present

## 2022-04-29 DIAGNOSIS — Z79891 Long term (current) use of opiate analgesic: Secondary | ICD-10-CM | POA: Diagnosis not present

## 2022-04-29 DIAGNOSIS — Z9013 Acquired absence of bilateral breasts and nipples: Secondary | ICD-10-CM | POA: Diagnosis not present

## 2022-04-29 DIAGNOSIS — K219 Gastro-esophageal reflux disease without esophagitis: Secondary | ICD-10-CM | POA: Diagnosis not present

## 2022-04-29 DIAGNOSIS — C7951 Secondary malignant neoplasm of bone: Secondary | ICD-10-CM | POA: Diagnosis not present

## 2022-05-01 ENCOUNTER — Other Ambulatory Visit: Payer: Self-pay | Admitting: *Deleted

## 2022-05-01 DIAGNOSIS — C50911 Malignant neoplasm of unspecified site of right female breast: Secondary | ICD-10-CM

## 2022-05-01 DIAGNOSIS — C50912 Malignant neoplasm of unspecified site of left female breast: Secondary | ICD-10-CM | POA: Diagnosis not present

## 2022-05-01 DIAGNOSIS — M25512 Pain in left shoulder: Secondary | ICD-10-CM | POA: Diagnosis not present

## 2022-05-01 DIAGNOSIS — I871 Compression of vein: Secondary | ICD-10-CM

## 2022-05-01 DIAGNOSIS — M170 Bilateral primary osteoarthritis of knee: Secondary | ICD-10-CM | POA: Diagnosis not present

## 2022-05-01 DIAGNOSIS — I10 Essential (primary) hypertension: Secondary | ICD-10-CM | POA: Diagnosis not present

## 2022-05-01 DIAGNOSIS — Z6841 Body Mass Index (BMI) 40.0 and over, adult: Secondary | ICD-10-CM | POA: Diagnosis not present

## 2022-05-02 NOTE — Progress Notes (Signed)
That she is on who needs  Patient Care Team: Ernestene Kiel, MD as PCP - General (Internal Medicine) Haze Rushing., MD as Consulting Physician (Pain Medicine) Joya Salm, MD as Referring Physician (Orthopedic Surgery) Jaymes Graff, DO as Consulting Physician (Orthopedic Surgery) Larey Dresser, MD as Consulting Physician (Cardiology) Bensimhon, Shaune Pascal, MD as Consulting Physician (Cardiology) Lorelle Gibbs, MD (Radiology) Tommy Medal, Lavell Islam, MD as Consulting Physician (Infectious Diseases) Gery Pray, MD as Consulting Physician (Radiation Oncology) Nicholas Lose, MD as Consulting Physician (Hematology and Oncology)  DIAGNOSIS:  Encounter Diagnosis  Name Primary?   Carcinoma of right breast metastatic to multiple sites ([place])     SUMMARY OF ONCOLOGIC HISTORY: Oncology History  Breast cancer metastasized to multiple sites [facility])  02/26/2013 Initial Diagnosis   history of inflammatory right breast cancer metastatic at presentation September 2004 with involvement of liver and bone, HER-2 positive, estrogen and progesterone receptor negative      - 11/2013 Chemotherapy   carboplatin, docetaxel and Herceptin x 6 completed April 2005     Surgery   bilateral mastectomies with bilateral axillary lymph node dissection 12/07/2004, showing             (a) on the right, a mypT1c ypN1 invasive ductal carcinoma, grade 3, estrogen and progesterone receptor negative, HER-2 positive, with an MIB-1 of 31%             (b) on the left, ypT2 ypN1 invasive ductal carcinoma, grade 2, estrogen and progesterone receptor negative, HER-2 positive, with an MIB-1 of 35%.   01/2015 - 02/2015 Radiation Therapy   Adj XRT    - 03/2018 Chemotherapy   Ixampra     CHIEF COMPLIANT:  Follow-up of  estrogen receptor negative, but HER2 amplified breast cancer on coumadin and  herceptin maintenance    INTERVAL HISTORY: [name] is a 71 y.o. with above-mentioned history of estrogen  receptor negative, but HER2 amplified breast cancer. She presents to the clinic today for follow-up. She reports no concerns or complaints.   ALLERGIES:  is allergic to penicillins and adhesive [tape].  MEDICATIONS:  Current Outpatient Medications  Medication Sig Dispense Refill   acetaminophen (TYLENOL) 500 MG tablet Take 1,000 mg by mouth every 6 (six) hours as needed for mild pain or fever.      albuterol (PROVENTIL HFA;VENTOLIN HFA) 108 (90 BASE) MCG/ACT inhaler Inhale 2 puffs into the lungs every 6 (six) hours as needed for wheezing. 1 Inhaler 5   ALPRAZolam (XANAX) 1 MG tablet Take 1 tablet (1 mg total) by mouth as needed for anxiety. 30 tablet 3   amLODipine (NORVASC) 10 MG tablet Take 1 tablet (10 mg total) by mouth every morning. 30 tablet 4   baclofen (LIORESAL) 10 MG tablet TAKE 1 TABLET BY MOUTH THREE TIMES DAILY AS NEEDED FOR MUSCLE SPASMS 270 tablet 0   carvedilol (COREG) 12.5 MG tablet TAKE 1 TABLET(12.5 MG) BY MOUTH TWICE DAILY 60 tablet 11   diclofenac sodium (VOLTAREN) 1 % GEL Apply 2 g topically daily as needed (for pain). Apply to knees and shoulders 100 g 6   DULoxetine (CYMBALTA) 20 MG capsule Take 20 mg by mouth daily.     enoxaparin (LOVENOX) 120 MG/0.8ML injection Inject 0.8 mLs (120 mg total) into the skin every 12 (twelve) hours. Start 5 days before surgery. 8 mL 0   furosemide (LASIX) 40 MG tablet Take 40 mg by mouth as needed.     gabapentin (NEURONTIN) 300 MG capsule TAKE  2 CAPSULES BY MOUTH THREE TIMES DAILY 540 capsule 0   losartan (COZAAR) 100 MG tablet Take 100 mg by mouth every morning.     oxyCODONE-acetaminophen (PERCOCET) 10-325 MG tablet Take 1 tablet by mouth 4 (four) times daily as needed.     potassium chloride SA (KLOR-CON) 20 MEQ tablet Take 1 tablet (20 mEq total) by mouth daily as needed. With use of lasix 30 tablet 3   predniSONE (DELTASONE) 5 MG tablet Take 5 mg by mouth daily in the afternoon.     spironolactone (ALDACTONE) 25 MG tablet TAKE 1  TABLET(25 MG) BY MOUTH DAILY 30 tablet 11   temazepam (RESTORIL) 30 MG capsule Take 1 capsule (30 mg total) by mouth at bedtime. 30 capsule 3   warfarin (COUMADIN) 2.5 MG tablet Take 1 tablet (2.5 mg total) by mouth daily. 30 tablet 3   warfarin (COUMADIN) 5 MG tablet Take 1 tablet (5 mg total) by mouth daily. 30 tablet 3   No current facility-administered medications for this visit.   Facility-Administered Medications Ordered in Other Visits  Medication Dose Route Frequency Provider Last Rate Last Admin   sodium chloride flush (NS) 0.9 % injection 10 mL  10 mL Intravenous PRN Magrinat, Virgie Dad, MD   10 mL at 12/15/15 1200   sodium chloride flush (NS) 0.9 % injection 10 mL  10 mL Intracatheter PRN Magrinat, Virgie Dad, MD   10 mL at 08/14/18 1116   sodium chloride flush (NS) 0.9 % injection 10 mL  10 mL Intracatheter PRN Magrinat, Virgie Dad, MD        PHYSICAL EXAMINATION: ECOG PERFORMANCE STATUS: 1 - Symptomatic but completely ambulatory  Vitals:   05/03/22 0914  BP: (!) 155/78  Pulse: 63  Resp: 14  Temp: 97.7 F (36.5 C)  SpO2: 98%   Filed Weights   05/03/22 0914  Weight: 255 lb 11.2 oz (116 kg)     LABORATORY DATA:  I have reviewed the data as listed    Latest Ref Rng & Units 05/03/2022    8:40 AM 04/05/2022   10:37 AM 03/08/2022   10:32 AM  CMP  Glucose 70 - 99 mg/dL 118  102  101   BUN 8 - 23 mg/dL '17  19  14   ' Creatinine 0.44 - 1.00 mg/dL 0.95  0.85  1.00   Sodium 135 - 145 mmol/L 140  140  143   Potassium 3.5 - 5.1 mmol/L 4.2  4.2  3.7   Chloride 98 - 111 mmol/L 106  105  107   CO2 22 - 32 mmol/L '27  28  30   ' Calcium 8.9 - 10.3 mg/dL 9.3  9.3  9.3   Total Protein 6.5 - 8.1 g/dL 7.6  8.1  7.1   Total Bilirubin 0.3 - 1.2 mg/dL 0.7  0.9  0.8   Alkaline Phos 38 - 126 U/L 74  77  69   AST 15 - 41 U/L 13  12  59   ALT 0 - 44 U/L 9  9  60     Lab Results  Component Value Date   WBC 8.5 05/03/2022   HGB 11.4 (L) 05/03/2022   HCT 32.5 (L) 05/03/2022   MCV 78.1 (L)  05/03/2022   PLT 250 05/03/2022   NEUTROABS 6.4 05/03/2022    ASSESSMENT & PLAN:  Breast cancer metastasized to multiple sites 2004: Liver. Lung and bone mets Neoadj chemo TCH X 6 completed April 2005 foll by herceptin maintenance  bilateral mastectomies with bilateral axillary lymph node dissection 12/07/2004, showing             (a) on the right, a mypT1c ypN1 invasive ductal carcinoma, grade 3, estrogen and progesterone receptor negative, HER-2 positive, with an MIB-1 of 31%             (b) on the left, ypT2 ypN1 invasive ductal carcinoma, grade 2, estrogen and progesterone receptor negative, HER-2 positive, with an MIB-1 of 35%.   Adj XRT ----------------------------------------------------------------------------------------------------------------------------------------  Current treatment: Herceptin maintenance therapy Plan to continue Herceptin indefinitely every 28 days.  CT chest abdomen and pelvis 04/04/2022: New soft tissue nodule suggestive of a postoperative fluid collection measuring 0.9 cm concerning for local recurrence.  No other evidence of metastatic disease. Recommendation: Surgical excision biopsy of the subcutaneous nodules  SVC syndrome: On lifelong anticoagulation.  Currently on Coumadin because the Xarelto was not covered by her insurance  Follow-up in 3 weeks to discuss results of surgery Return to clinic for monthly Herceptin infusions    No orders of the defined types were placed in this encounter.  The patient has a good understanding of the overall plan. she agrees with it. she will call with any problems that may develop before the next visit here. Total time spent: 30 mins including face to face time and time spent for planning, charting and co-ordination of care   Harriette Ohara, MD 05/03/22    I Gardiner Coins am scribing for Dr. Lindi Adie  I have reviewed the above documentation for accuracy and completeness, and I agree with the above.

## 2022-05-03 ENCOUNTER — Other Ambulatory Visit: Payer: Self-pay

## 2022-05-03 ENCOUNTER — Inpatient Hospital Stay (HOSPITAL_BASED_OUTPATIENT_CLINIC_OR_DEPARTMENT_OTHER): Payer: Medicare Other | Admitting: Hematology and Oncology

## 2022-05-03 ENCOUNTER — Encounter: Payer: Self-pay | Admitting: *Deleted

## 2022-05-03 ENCOUNTER — Inpatient Hospital Stay: Payer: Medicare Other

## 2022-05-03 ENCOUNTER — Inpatient Hospital Stay: Payer: Medicare Other | Attending: Oncology

## 2022-05-03 DIAGNOSIS — C50919 Malignant neoplasm of unspecified site of unspecified female breast: Secondary | ICD-10-CM

## 2022-05-03 DIAGNOSIS — I871 Compression of vein: Secondary | ICD-10-CM | POA: Diagnosis not present

## 2022-05-03 DIAGNOSIS — Z7901 Long term (current) use of anticoagulants: Secondary | ICD-10-CM | POA: Insufficient documentation

## 2022-05-03 DIAGNOSIS — Z5112 Encounter for antineoplastic immunotherapy: Secondary | ICD-10-CM | POA: Insufficient documentation

## 2022-05-03 DIAGNOSIS — D689 Coagulation defect, unspecified: Secondary | ICD-10-CM

## 2022-05-03 DIAGNOSIS — Z171 Estrogen receptor negative status [ER-]: Secondary | ICD-10-CM | POA: Diagnosis not present

## 2022-05-03 DIAGNOSIS — C50911 Malignant neoplasm of unspecified site of right female breast: Secondary | ICD-10-CM | POA: Insufficient documentation

## 2022-05-03 DIAGNOSIS — C7951 Secondary malignant neoplasm of bone: Secondary | ICD-10-CM | POA: Diagnosis not present

## 2022-05-03 DIAGNOSIS — C50812 Malignant neoplasm of overlapping sites of left female breast: Secondary | ICD-10-CM | POA: Insufficient documentation

## 2022-05-03 DIAGNOSIS — C7800 Secondary malignant neoplasm of unspecified lung: Secondary | ICD-10-CM | POA: Insufficient documentation

## 2022-05-03 LAB — CBC WITH DIFFERENTIAL (CANCER CENTER ONLY)
Abs Immature Granulocytes: 0.04 10*3/uL (ref 0.00–0.07)
Basophils Absolute: 0 10*3/uL (ref 0.0–0.1)
Basophils Relative: 1 %
Eosinophils Absolute: 0.1 10*3/uL (ref 0.0–0.5)
Eosinophils Relative: 1 %
HCT: 32.5 % — ABNORMAL LOW (ref 36.0–46.0)
Hemoglobin: 11.4 g/dL — ABNORMAL LOW (ref 12.0–15.0)
Immature Granulocytes: 1 %
Lymphocytes Relative: 18 %
Lymphs Abs: 1.5 10*3/uL (ref 0.7–4.0)
MCH: 27.4 pg (ref 26.0–34.0)
MCHC: 35.1 g/dL (ref 30.0–36.0)
MCV: 78.1 fL — ABNORMAL LOW (ref 80.0–100.0)
Monocytes Absolute: 0.5 10*3/uL (ref 0.1–1.0)
Monocytes Relative: 6 %
Neutro Abs: 6.4 10*3/uL (ref 1.7–7.7)
Neutrophils Relative %: 73 %
Platelet Count: 250 10*3/uL (ref 150–400)
RBC: 4.16 MIL/uL (ref 3.87–5.11)
RDW: 14.9 % (ref 11.5–15.5)
WBC Count: 8.5 10*3/uL (ref 4.0–10.5)
nRBC: 0 % (ref 0.0–0.2)

## 2022-05-03 LAB — PROTIME-INR
INR: 1.3 — ABNORMAL HIGH (ref 0.8–1.2)
Prothrombin Time: 15.7 seconds — ABNORMAL HIGH (ref 11.4–15.2)

## 2022-05-03 LAB — CMP (CANCER CENTER ONLY)
ALT: 9 U/L (ref 0–44)
AST: 13 U/L — ABNORMAL LOW (ref 15–41)
Albumin: 3.9 g/dL (ref 3.5–5.0)
Alkaline Phosphatase: 74 U/L (ref 38–126)
Anion gap: 7 (ref 5–15)
BUN: 17 mg/dL (ref 8–23)
CO2: 27 mmol/L (ref 22–32)
Calcium: 9.3 mg/dL (ref 8.9–10.3)
Chloride: 106 mmol/L (ref 98–111)
Creatinine: 0.95 mg/dL (ref 0.44–1.00)
GFR, Estimated: >60 mL/min (ref >60)
Glucose, Bld: 118 mg/dL — ABNORMAL HIGH (ref 70–99)
Potassium: 4.2 mmol/L (ref 3.5–5.1)
Sodium: 140 mmol/L (ref 135–145)
Total Bilirubin: 0.7 mg/dL (ref 0.3–1.2)
Total Protein: 7.6 g/dL (ref 6.5–8.1)

## 2022-05-03 MED ORDER — SODIUM CHLORIDE 0.9% FLUSH
10.0000 mL | INTRAVENOUS | Status: DC | PRN
  Administered 2022-05-03: 10 mL

## 2022-05-03 MED ORDER — LORAZEPAM 2 MG/ML IJ SOLN
1.0000 mg | Freq: Once | INTRAMUSCULAR | Status: AC | PRN
  Administered 2022-05-03: 1 mg via INTRAVENOUS
  Filled 2022-05-03: qty 1

## 2022-05-03 MED ORDER — ACETAMINOPHEN 325 MG PO TABS
650.0000 mg | ORAL_TABLET | Freq: Once | ORAL | Status: AC
  Administered 2022-05-03: 650 mg via ORAL
  Filled 2022-05-03: qty 2

## 2022-05-03 MED ORDER — HEPARIN SOD (PORK) LOCK FLUSH 100 UNIT/ML IV SOLN
500.0000 [IU] | Freq: Once | INTRAVENOUS | Status: AC | PRN
  Administered 2022-05-03: 500 [IU]

## 2022-05-03 MED ORDER — DIPHENHYDRAMINE HCL 25 MG PO CAPS
25.0000 mg | ORAL_CAPSULE | Freq: Once | ORAL | Status: AC
  Administered 2022-05-03: 25 mg via ORAL
  Filled 2022-05-03: qty 1

## 2022-05-03 MED ORDER — SODIUM CHLORIDE 0.9 % IV SOLN: Freq: Once | INTRAVENOUS | Status: AC

## 2022-05-03 MED ORDER — TRASTUZUMAB-DKST CHEMO 150 MG IV SOLR
750.0000 mg | Freq: Once | INTRAVENOUS | Status: AC
  Administered 2022-05-03: 750 mg via INTRAVENOUS
  Filled 2022-05-03: qty 35.72

## 2022-05-03 NOTE — Assessment & Plan Note (Addendum)
2004: Liver. Lung and bone mets Neoadj chemo TCH X 6completed April 2019fll by herceptin maintenance  bilateral mastectomieswith bilateral axillary lymph node dissection 12/07/2004, showing (a) on the right, a mypT1c ypN1 invasive ductal carcinoma, grade 3, estrogen and progesterone receptor negative, HER-2 positive, with an MIB-1 of 31% (b) on the left, ypT2 ypN1 invasive ductal carcinoma, grade 2, estrogen and progesterone receptor negative, HER-2 positive, with an MIB-1 of 35%.  Adj XRT ---------------------------------------------------------------------------------------------------------------------------------------- Current treatment:Herceptin maintenance therapy Plan to continue Herceptin indefinitely every 28 days.  CT chest abdomen and pelvis 04/04/2022: New soft tissue nodule suggestive of a postoperative fluid collection measuring 0.9 cm concerning for local recurrence.  No other evidence of metastatic disease. Recommendation: Surgical excision biopsy of the subcutaneous nodules  SVC syndrome: On lifelong anticoagulation.Currently on Coumadin because the Xarelto was not covered by her insurance  Follow-up in 3 weeks to discuss results of surgery Return to clinic for monthly Herceptin infusions

## 2022-05-03 NOTE — Patient Instructions (Signed)
[facility]  Discharge Instructions: Thank you for choosing [facility] to provide your oncology and hematology care.   If you have a lab appointment with the Cancer Center, please go directly to the Cancer Center and check in at the registration area.   Wear comfortable clothing and clothing appropriate for easy access to any Portacath or PICC line.   We strive to give you quality time with your provider. You may need to reschedule your appointment if you arrive late (15 or more minutes).  Arriving late affects you and other patients whose appointments are after yours.  Also, if you miss three or more appointments without notifying the office, you may be dismissed from the clinic at the provider's discretion.      For prescription refill requests, have your pharmacy contact our office and allow 72 hours for refills to be completed.    Today you received the following chemotherapy and/or immunotherapy agents herceptin      To help prevent nausea and vomiting after your treatment, we encourage you to take your nausea medication as directed.  BELOW ARE SYMPTOMS THAT SHOULD BE REPORTED IMMEDIATELY: *FEVER GREATER THAN 100.4 F (38 C) OR HIGHER *CHILLS OR SWEATING *NAUSEA AND VOMITING THAT IS NOT CONTROLLED WITH YOUR NAUSEA MEDICATION *UNUSUAL SHORTNESS OF BREATH *UNUSUAL BRUISING OR BLEEDING *URINARY PROBLEMS (pain or burning when urinating, or frequent urination) *BOWEL PROBLEMS (unusual diarrhea, constipation, pain near the anus) TENDERNESS IN MOUTH AND THROAT WITH OR WITHOUT PRESENCE OF ULCERS (sore throat, sores in mouth, or a toothache) UNUSUAL RASH, SWELLING OR PAIN  UNUSUAL VAGINAL DISCHARGE OR ITCHING   Items with * indicate a potential emergency and should be followed up as soon as possible or go to the Emergency Department if any problems should occur.  Please show the CHEMOTHERAPY ALERT CARD or IMMUNOTHERAPY ALERT CARD at check-in to  the Emergency Department and triage nurse.  Should you have questions after your visit or need to cancel or reschedule your appointment, please contact [facility]  Dept: [phone]  and follow the prompts.  Office hours are 8:00 a.m. to 4:30 p.m. Monday - Friday. Please note that voicemails left after 4:00 p.m. may not be returned until the following business day.  We are closed weekends and major holidays. You have access to a nurse at all times for urgent questions. Please call the main number to the clinic Dept: [phone] and follow the prompts.   For any non-urgent questions, you may also contact your provider using MyChart. We now offer e-Visits for anyone 18 and older to request care online for non-urgent symptoms. For details visit [email or web address].   Also download the MyChart app! Go to the app store, search "MyChart", open the app, select [facility], and log in with your MyChart username and password.  Masks are optional in the cancer centers. If you would like for your care team to wear a mask while they are taking care of you, please let them know. You may have one support person who is at least 70 years old accompany you for your appointments. 

## 2022-05-03 NOTE — Progress Notes (Signed)
Per MD request, pt educated in office today on Warfarin-Lovenox Bridge.  Pt educated to take last dose of Warfarin 05/04/22.  Pt educated to start Lovenox BID 05/05/22-913/23.  No anticoagulation day of surgery 05/10/22.  Pt educated to resume Warfarin 2.5 mg tablet daily starting 05/11/22.  [place] RN [facility] notified of plan and requested for INR to be checked 05/18/22.  Starla verbalized understanding as well as pt of plan.

## 2022-05-09 ENCOUNTER — Other Ambulatory Visit: Payer: Self-pay

## 2022-05-09 ENCOUNTER — Encounter (HOSPITAL_COMMUNITY): Payer: Self-pay | Admitting: Surgery

## 2022-05-09 NOTE — Progress Notes (Signed)
left voicemail for patient and spouse with new arrival time of 0900, clear liquids okay until 0830

## 2022-05-09 NOTE — Progress Notes (Signed)
SDW CALL  Patient was given pre-op instructions over the phone. The opportunity was given for the patient to ask questions. No further questions asked. Patient verbalized understanding of instructions given.   PCP - Ernestene Kiel, MD Cardiologist - Loralie Champagne, MD  PPM/ICD - denies  Chest x-ray - N/A EKG - 03/22/22 Stress Test - denies ECHO - 03/22/22 Cardiac Cath - denies  Sleep Study - denies  Blood Thinner Instructions: Coumadin last on 05/04/22, lovenox last dose PM 05/09/22  ERAS Protcol - ERAS- clears until 11:00 PRE-SURGERY Ensure or G2-   COVID TEST- N/A   Anesthesia review: yes, posting states "Medical clearance". Pt states she was not sent to get clearance but states that Dr Aundra Dubin, her PCP and oncologist all know that she is having surgery. She has had bilateral mastectomy with lymph node removal. she states that she does get swelling in both arms at times and BP has to be taken on lower arm. She stated that she has a port. Pt states that she has no new CHF symptoms (no increased swelling, sob, chest pain, dizziness) and she feels well currently.   Patient denies shortness of breath, fever, cough and chest pain over the phone call    Surgical Instructions    Your procedure is scheduled on 05/10/22  Report to [facility] Main Entrance "A" at 11:30 A.M., then check in with the Admitting office.  Call this number if you have problems the morning of surgery:  [phone]    Remember:  Do not eat after midnight the night before your surgery  You may drink clear liquids until 11:00AM the morning of your surgery.   Clear liquids allowed are: Water, Non-Citrus Juices (without pulp), Carbonated Beverages, Clear Tea, Black Coffee ONLY (NO MILK, CREAM OR POWDERED CREAMER of any kind), and Gatorade   Take these medicines the morning of surgery with A SIP OF WATER:     As of today, STOP taking any Aleve, Naproxen, Ibuprofen, Motrin, Advil, Goody's, BC's, all herbal  medications, fish oil, and all vitamins.  [facility] is not responsible for any belongings or valuables.     Contacts, glasses, hearing aids, dentures or partials may not be worn into surgery, please bring cases for these belongings   Patients discharged the day of surgery will not be allowed to drive home, and someone needs to stay with them for 24 hours.   SURGICAL WAITING ROOM VISITATION You may have 1 visitor in the pre-op area at a time determined by the pre-op nurse. (Visitor may not switch out) Patients having surgery or a procedure in a hospital may have two support people in the waiting room. Children under the age of 64 must have an adult with them who is not the patient. They may stay in the waiting area during the procedure and may switch out with other visitors. If the patient needs to stay at the hospital during part of their recovery, the visitor guidelines for inpatient rooms apply.  Please refer to the [facility] website for the visitor guidelines for Inpatients (after your surgery is over and you are in a regular room).     Special instructions:    Oral Hygiene is also important to reduce your risk of infection.  Remember - BRUSH YOUR TEETH THE MORNING OF SURGERY WITH YOUR REGULAR TOOTHPASTE   Day of Surgery:  Take a shower the day of and night before with antibacterial soap. Wear Clean/Comfortable clothing the morning of surgery Do not apply any  deodorants/lotions.   Do not wear jewelry or makeup Do not wear lotions, powders, perfumes/colognes, or deodorant. Do not shave 48 hours prior to surgery.  Men may shave face and neck. Do not bring valuables to the hospital. Do not wear nail polish, gel polish, artificial nails, or any other type of covering on natural nails (fingers and toes) If you have artificial nails or gel coating that need to be removed by a nail salon, please have this removed prior to surgery. Artificial nails or gel coating may interfere with  anesthesia's ability to adequately monitor your vital signs.

## 2022-05-10 ENCOUNTER — Ambulatory Visit (HOSPITAL_COMMUNITY)
Admission: RE | Admit: 2022-05-10 | Discharge: 2022-05-10 | Disposition: A | Discharge status: Home / Self Care | Payer: Medicare Other | Attending: Surgery | Admitting: Surgery

## 2022-05-10 ENCOUNTER — Encounter (HOSPITAL_COMMUNITY): Payer: Self-pay | Admitting: Surgery

## 2022-05-10 ENCOUNTER — Encounter (HOSPITAL_COMMUNITY)
Admission: RE | Disposition: A | Discharge status: Home / Self Care | Payer: Self-pay | Source: Home / Self Care | Attending: Surgery

## 2022-05-10 ENCOUNTER — Ambulatory Visit (HOSPITAL_BASED_OUTPATIENT_CLINIC_OR_DEPARTMENT_OTHER): Payer: Medicare Other | Admitting: Physician Assistant

## 2022-05-10 ENCOUNTER — Ambulatory Visit (HOSPITAL_COMMUNITY): Payer: Medicare Other | Admitting: Physician Assistant

## 2022-05-10 DIAGNOSIS — I10 Essential (primary) hypertension: Secondary | ICD-10-CM | POA: Diagnosis not present

## 2022-05-10 DIAGNOSIS — R222 Localized swelling, mass and lump, trunk: Secondary | ICD-10-CM | POA: Diagnosis not present

## 2022-05-10 DIAGNOSIS — Z6841 Body Mass Index (BMI) 40.0 and over, adult: Secondary | ICD-10-CM

## 2022-05-10 DIAGNOSIS — Y838 Other surgical procedures as the cause of abnormal reaction of the patient, or of later complication, without mention of misadventure at the time of the procedure: Secondary | ICD-10-CM | POA: Insufficient documentation

## 2022-05-10 DIAGNOSIS — C787 Secondary malignant neoplasm of liver and intrahepatic bile duct: Secondary | ICD-10-CM | POA: Diagnosis not present

## 2022-05-10 DIAGNOSIS — C7951 Secondary malignant neoplasm of bone: Secondary | ICD-10-CM | POA: Insufficient documentation

## 2022-05-10 DIAGNOSIS — Z853 Personal history of malignant neoplasm of breast: Secondary | ICD-10-CM | POA: Diagnosis not present

## 2022-05-10 DIAGNOSIS — Z9013 Acquired absence of bilateral breasts and nipples: Secondary | ICD-10-CM | POA: Insufficient documentation

## 2022-05-10 DIAGNOSIS — M96843 Postprocedural seroma of a musculoskeletal structure following other procedure: Secondary | ICD-10-CM | POA: Insufficient documentation

## 2022-05-10 DIAGNOSIS — Z923 Personal history of irradiation: Secondary | ICD-10-CM | POA: Diagnosis not present

## 2022-05-10 DIAGNOSIS — Z79899 Other long term (current) drug therapy: Secondary | ICD-10-CM | POA: Diagnosis not present

## 2022-05-10 DIAGNOSIS — M7989 Other specified soft tissue disorders: Secondary | ICD-10-CM | POA: Diagnosis not present

## 2022-05-10 DIAGNOSIS — L7634 Postprocedural seroma of skin and subcutaneous tissue following other procedure: Secondary | ICD-10-CM | POA: Diagnosis not present

## 2022-05-10 HISTORY — PX: MASS EXCISION: SHX2000

## 2022-05-10 HISTORY — DX: Anemia, unspecified: D64.9

## 2022-05-10 HISTORY — DX: Anxiety disorder, unspecified: F41.9

## 2022-05-10 LAB — PROTIME-INR
INR: 1 (ref 0.8–1.2)
Prothrombin Time: 13.4 seconds (ref 11.4–15.2)

## 2022-05-10 SURGERY — EXCISION MASS
Anesthesia: General | Site: Chest | Laterality: Left

## 2022-05-10 MED ORDER — ONDANSETRON HCL 4 MG/2ML IJ SOLN
INTRAMUSCULAR | Status: AC
  Filled 2022-05-10: qty 2

## 2022-05-10 MED ORDER — ROCURONIUM BROMIDE 10 MG/ML (PF) SYRINGE
PREFILLED_SYRINGE | INTRAVENOUS | Status: AC
  Filled 2022-05-10: qty 10

## 2022-05-10 MED ORDER — PROPOFOL 10 MG/ML IV BOLUS
INTRAVENOUS | Status: AC
  Filled 2022-05-10: qty 20

## 2022-05-10 MED ORDER — PROPOFOL 10 MG/ML IV BOLUS
INTRAVENOUS | Status: DC | PRN
  Administered 2022-05-10: 50 mg via INTRAVENOUS
  Administered 2022-05-10: 150 mg via INTRAVENOUS

## 2022-05-10 MED ORDER — 0.9 % SODIUM CHLORIDE (POUR BTL) OPTIME
TOPICAL | Status: DC | PRN
  Administered 2022-05-10: 1000 mL

## 2022-05-10 MED ORDER — PHENYLEPHRINE 80 MCG/ML (10ML) SYRINGE FOR IV PUSH (FOR BLOOD PRESSURE SUPPORT)
PREFILLED_SYRINGE | INTRAVENOUS | Status: AC
  Filled 2022-05-10: qty 10

## 2022-05-10 MED ORDER — LACTATED RINGERS IV SOLN: INTRAVENOUS | Status: DC

## 2022-05-10 MED ORDER — AMISULPRIDE (ANTIEMETIC) 5 MG/2ML IV SOLN: 10.0000 mg | Freq: Once | INTRAVENOUS | Status: DC | PRN

## 2022-05-10 MED ORDER — STERILE WATER FOR IRRIGATION IR SOLN
Status: DC | PRN
  Administered 2022-05-10: 1000 mL

## 2022-05-10 MED ORDER — TRAMADOL HCL 50 MG PO TABS
50.0000 mg | ORAL_TABLET | Freq: Once | ORAL | Status: AC
  Administered 2022-05-10: 50 mg via ORAL

## 2022-05-10 MED ORDER — FENTANYL CITRATE (PF) 250 MCG/5ML IJ SOLN
INTRAMUSCULAR | Status: DC | PRN
  Administered 2022-05-10 (×4): 25 ug via INTRAVENOUS

## 2022-05-10 MED ORDER — LIDOCAINE 2% (20 MG/ML) 5 ML SYRINGE
INTRAMUSCULAR | Status: DC | PRN
  Administered 2022-05-10: 100 mg via INTRAVENOUS

## 2022-05-10 MED ORDER — CHLORHEXIDINE GLUCONATE CLOTH 2 % EX PADS: 6.0000 | MEDICATED_PAD | Freq: Once | CUTANEOUS | Status: DC

## 2022-05-10 MED ORDER — DEXAMETHASONE SODIUM PHOSPHATE 10 MG/ML IJ SOLN
INTRAMUSCULAR | Status: AC
  Filled 2022-05-10: qty 1

## 2022-05-10 MED ORDER — ORAL CARE MOUTH RINSE: 15.0000 mL | Freq: Once | OROMUCOSAL | Status: AC

## 2022-05-10 MED ORDER — TRAMADOL HCL 50 MG PO TABS
ORAL_TABLET | ORAL | Status: AC
  Filled 2022-05-10: qty 1

## 2022-05-10 MED ORDER — OXYCODONE HCL 5 MG/5ML PO SOLN: 5.0000 mg | Freq: Once | ORAL | Status: DC | PRN

## 2022-05-10 MED ORDER — VANCOMYCIN HCL 1000 MG IV SOLR
INTRAVENOUS | Status: DC | PRN
  Administered 2022-05-10: 1000 mg via INTRAVENOUS

## 2022-05-10 MED ORDER — TRAMADOL HCL 50 MG PO TABS: 50.0000 mg | ORAL_TABLET | Freq: Four times a day (QID) | ORAL | 0 refills | Status: DC | PRN

## 2022-05-10 MED ORDER — BUPIVACAINE-EPINEPHRINE (PF) 0.25% -1:200000 IJ SOLN
INTRAMUSCULAR | Status: AC
  Filled 2022-05-10: qty 30

## 2022-05-10 MED ORDER — VANCOMYCIN HCL 1500 MG/300ML IV SOLN
1500.0000 mg | INTRAVENOUS | Status: AC
  Administered 2022-05-10: 1500 mg via INTRAVENOUS
  Filled 2022-05-10: qty 300

## 2022-05-10 MED ORDER — KETOROLAC TROMETHAMINE 30 MG/ML IJ SOLN
INTRAMUSCULAR | Status: AC
  Filled 2022-05-10: qty 1

## 2022-05-10 MED ORDER — DEXAMETHASONE SODIUM PHOSPHATE 10 MG/ML IJ SOLN
INTRAMUSCULAR | Status: DC | PRN
  Administered 2022-05-10: 5 mg via INTRAVENOUS

## 2022-05-10 MED ORDER — BUPIVACAINE HCL (PF) 0.25 % IJ SOLN
INTRAMUSCULAR | Status: DC | PRN
  Administered 2022-05-10: 20 mL

## 2022-05-10 MED ORDER — FENTANYL CITRATE (PF) 250 MCG/5ML IJ SOLN
INTRAMUSCULAR | Status: AC
  Filled 2022-05-10: qty 5

## 2022-05-10 MED ORDER — MIDAZOLAM HCL 2 MG/2ML IJ SOLN
INTRAMUSCULAR | Status: AC
  Filled 2022-05-10: qty 2

## 2022-05-10 MED ORDER — OXYCODONE HCL 5 MG PO TABS: 5.0000 mg | ORAL_TABLET | Freq: Once | ORAL | Status: DC | PRN

## 2022-05-10 MED ORDER — LIDOCAINE 2% (20 MG/ML) 5 ML SYRINGE
INTRAMUSCULAR | Status: AC
  Filled 2022-05-10: qty 5

## 2022-05-10 MED ORDER — ACETAMINOPHEN 500 MG PO TABS
1000.0000 mg | ORAL_TABLET | Freq: Once | ORAL | Status: AC
  Administered 2022-05-10: 1000 mg via ORAL
  Filled 2022-05-10: qty 2

## 2022-05-10 MED ORDER — ONDANSETRON HCL 4 MG/2ML IJ SOLN: 4.0000 mg | Freq: Once | INTRAMUSCULAR | Status: DC | PRN

## 2022-05-10 MED ORDER — CHLORHEXIDINE GLUCONATE 0.12 % MT SOLN
15.0000 mL | Freq: Once | OROMUCOSAL | Status: AC
  Administered 2022-05-10: 15 mL via OROMUCOSAL
  Filled 2022-05-10: qty 15

## 2022-05-10 MED ORDER — FENTANYL CITRATE (PF) 100 MCG/2ML IJ SOLN: 25.0000 ug | INTRAMUSCULAR | Status: DC | PRN

## 2022-05-10 SURGICAL SUPPLY — 30 items
ADH SKN CLS APL DERMABOND .7 (GAUZE/BANDAGES/DRESSINGS) ×2
APL PRP STRL LF DISP 70% ISPRP (MISCELLANEOUS) ×1
BAG COUNTER SPONGE SURGICOUNT (BAG) ×1 IMPLANT
BAG SPNG CNTER NS LX DISP (BAG) ×1
BINDER BREAST LRG (GAUZE/BANDAGES/DRESSINGS) IMPLANT
CHLORAPREP W/TINT 26 (MISCELLANEOUS) ×1 IMPLANT
COVER SURGICAL LIGHT HANDLE (MISCELLANEOUS) ×1 IMPLANT
DERMABOND ADVANCE IMPLANT
DERMABOND ADVANCED .7 DNX12 (GAUZE/BANDAGES/DRESSINGS) ×1 IMPLANT
DRAPE LAPAROSCOPIC ABDOMINAL (DRAPES) IMPLANT
ELECT REM PT RETURN 9FT ADLT (ELECTROSURGICAL) ×1
ELECTRODE REM PT RTRN 9FT ADLT (ELECTROSURGICAL) ×1 IMPLANT
GAUZE PAD ABD 8X10 STRL (GAUZE/BANDAGES/DRESSINGS) IMPLANT
GLOVE BIO SURGEON STRL SZ8 (GLOVE) ×1 IMPLANT
GLOVE BIOGEL PI IND STRL 8 (GLOVE) ×1 IMPLANT
GOWN STRL REUS W/ TWL LRG LVL3 (GOWN DISPOSABLE) ×2 IMPLANT
GOWN STRL REUS W/ TWL XL LVL3 (GOWN DISPOSABLE) ×1 IMPLANT
GOWN STRL REUS W/TWL LRG LVL3 (GOWN DISPOSABLE) ×2
GOWN STRL REUS W/TWL XL LVL3 (GOWN DISPOSABLE) ×1
KIT BASIN OR (CUSTOM PROCEDURE TRAY) ×1 IMPLANT
KIT TURNOVER KIT B (KITS) ×1 IMPLANT
NDL HYPO 25GX1X1/2 BEV (NEEDLE) ×1 IMPLANT
NEEDLE HYPO 25GX1X1/2 BEV (NEEDLE) ×1 IMPLANT
NS IRRIG 1000ML POUR BTL (IV SOLUTION) ×1 IMPLANT
PACK GENERAL/GYN (CUSTOM PROCEDURE TRAY) ×1 IMPLANT
PAD ARMBOARD 7.5X6 YLW CONV (MISCELLANEOUS) ×1 IMPLANT
SPECIMEN JAR MEDIUM (MISCELLANEOUS) IMPLANT
SUT VIC AB 3-0 SH 18 (SUTURE) IMPLANT
SYR CONTROL 10ML LL (SYRINGE) ×1 IMPLANT
TOWEL GREEN STERILE (TOWEL DISPOSABLE) ×1 IMPLANT

## 2022-05-10 NOTE — Anesthesia Preprocedure Evaluation (Signed)
Anesthesia Evaluation  Patient identified by MRN, date of birth, ID band Patient awake    Reviewed: Allergy & Precautions, NPO status , Patient's Chart, lab work & pertinent test results, reviewed documented beta blocker date and time   History of Anesthesia Complications Negative for: history of anesthetic complications  Airway Mallampati: III  TM Distance: >3 FB Neck ROM: Full    Dental  (+) Dental Advisory Given   Pulmonary neg pulmonary ROS,    Pulmonary exam normal        Cardiovascular hypertension, Pt. on medications and Pt. on home beta blockers + DVT  Normal cardiovascular exam  Echo 03/22/22: EF 55-60%, no RWMA, normal DF, normal RVSF, mild MR    Neuro/Psych negative neurological ROS     GI/Hepatic negative GI ROS, Neg liver ROS,   Endo/Other  Morbid obesity  Renal/GU negative Renal ROS  negative genitourinary   Musculoskeletal negative musculoskeletal ROS (+)   Abdominal   Peds  Hematology  (+) Blood dyscrasia, anemia ,   Anesthesia Other Findings Left chest wall mass H/o metastatic breast cancer  Reproductive/Obstetrics                          Anesthesia Physical Anesthesia Plan  ASA: 3  Anesthesia Plan: General   Post-op Pain Management: Tylenol PO (pre-op)*   Induction: Intravenous  PONV Risk Score and Plan: 4 or greater and Ondansetron, Dexamethasone, Midazolam and Treatment may vary due to age or medical condition  Airway Management Planned: LMA  Additional Equipment: None  Intra-op Plan:   Post-operative Plan: Extubation in OR  Informed Consent: I have reviewed the patients History and Physical, chart, labs and discussed the procedure including the risks, benefits and alternatives for the proposed anesthesia with the patient or authorized representative who has indicated his/her understanding and acceptance.     Dental advisory given  Plan Discussed  with:   Anesthesia Plan Comments:        Anesthesia Quick Evaluation

## 2022-05-10 NOTE — H&P (Signed)
History of Present Illness: [name] is a 71 y.o. female who is seen today as an office consultation for evaluation of New Consultation . Patient sent for eval ration of left chest wall nodule. She has a history of inflammatory breast cancer dating back to September 2004. She has had bilateral mastectomies. She has stable stage IV disease remarkably. She has developed a new nodule along her incision on the left chest wall. There is no fluid collection there is well. The area is about a centimeter in size. She has done remarkably well given all of her disease. The rest of her imaging seems very stable. She was sent to have this area removed to evaluate further. She has not had a PET scan nor any biopsies of the area. SUMMARY OF ONCOLOGIC HISTORY:  Oncology History  Breast cancer metastasized to multiple sites [facility])  02/26/2013 Initial Diagnosis  history of inflammatory right breast cancer metastatic at presentation September 2004 with involvement of liver and bone, HER-2 positive, estrogen and progesterone receptor negative   - 11/2013 Chemotherapy  carboplatin, docetaxel and Herceptin x 6 completed April 2005   Surgery  bilateral mastectomies with bilateral axillary lymph node dissection 12/07/2004, showing (a) on the right, a mypT1c ypN1 invasive ductal carcinoma, grade 3, estrogen and progesterone receptor negative, HER-2 positive, with an MIB-1 of 31% (b) on the left, ypT2 ypN1 invasive ductal carcinoma, grade 2, estrogen and progesterone receptor negative, HER-2 positive, with an MIB-1 of 35%.  01/2015 - 02/2015 Radiation Therapy  Adj XRT  - 03/2018 Chemotherapy  Ixampra    Review of Systems: A complete review of systems was obtained from the patient. I have reviewed this information and discussed as appropriate with the patient. See HPI as well for other ROS.    Medical History: Past Medical History:  Diagnosis Date  DVT (deep venous thrombosis) (CMS-HCC)  History of cancer   Hypertension   There is no problem list on file for this patient.  Past Surgical History:  Procedure Laterality Date  blood clot surgery  CHOLECYSTECTOMY  LAPAROSCOPIC TUBAL LIGATION  MASTECTOMY    Allergies  Allergen Reactions  Penicillins Hives  PATIENT HAS TOLERATED CEPHALOSPORINGS   Current Outpatient Medications on File Prior to Visit  Medication Sig Dispense Refill  acetaminophen (TYLENOL) 500 MG tablet Take by mouth  albuterol 90 mcg/actuation inhaler Inhale 2 inhalations into the lungs  ALPRAZolam (XANAX) 1 MG tablet Take 1 mg by mouth once daily as needed for Anxiety  amLODIPine (NORVASC) 10 MG tablet Take 10 mg by mouth once daily  baclofen (LIORESAL) 10 MG tablet Take 1 tablet by mouth 3 (three) times daily as needed  carvediloL (COREG) 12.5 MG tablet Take 12.5 mg by mouth 2 (two) times daily  diclofenac (VOLTAREN) 1 % topical gel Apply 2 g topically 4 (four) times daily  FUROsemide (LASIX) 40 MG tablet Take 40 mg by mouth  gabapentin (NEURONTIN) 300 MG capsule Take 2 capsules by mouth 3 (three) times daily  losartan (COZAAR) 100 MG tablet Take 100 mg by mouth every morning  oxyCODONE-acetaminophen (PERCOCET) 10-325 mg tablet Take 1 tablet by mouth every 6 (six) hours as needed  potassium chloride (KLOR-CON) 20 MEQ ER tablet Take by mouth  predniSONE (DELTASONE) 5 MG tablet Take by mouth  spironolactone (ALDACTONE) 25 MG tablet Take 25 mg by mouth once daily  temazepam (RESTORIL) 30 mg capsule Take 30 mg by mouth at bedtime as needed for Sleep  warfarin (COUMADIN) 5 MG tablet Take 5 mg  by mouth once daily   No current facility-administered medications on file prior to visit.   Family History  Problem Relation Age of Onset  High blood pressure (Hypertension) Mother  Hyperlipidemia (Elevated cholesterol) Mother  High blood pressure (Hypertension) Father  Myocardial Infarction (Heart attack) Father  Diabetes Brother  Myocardial Infarction (Heart attack) Brother     Social History   Tobacco Use  Smoking Status Never  Smokeless Tobacco Never    Social History   Socioeconomic History  Marital status: Married  Tobacco Use  Smoking status: Never  Smokeless tobacco: Never   Objective:   Vitals:  04/27/22 0949  BP: 122/74  Pulse: 81  Weight: (!) 115.1 kg (253 lb 12.8 oz)  Height: 161.3 cm (5' 3.5")   Body mass index is 44.25 kg/m.  Physical Exam HENT:  Head: Normocephalic.  Cardiovascular:  Rate and Rhythm: Normal rate.  Pulses: Normal pulses.  Chest:   Comments: Both breasts are surgically absent. There is an area thickness along the lateral left mastectomy incision measuring about 3 cm. This corresponds to the area that was seen on the CT scan. No obvious axillary adenopathy. No significant lymphedema bilaterally. Skin: General: Skin is warm.  Neurological:  Mental Status: She is alert.    Labs, Imaging and Diagnostic Testing:  CLINICAL DATA: Metastatic breast cancer restaging, chemotherapy and XRT complete * Tracking Code: BO *  EXAM: CT CHEST, ABDOMEN, AND PELVIS WITH CONTRAST  TECHNIQUE: Multidetector CT imaging of the chest, abdomen and pelvis was performed following the standard protocol during bolus administration of intravenous contrast.  RADIATION DOSE REDUCTION: This exam was performed according to the departmental dose-optimization program which includes automated exposure control, adjustment of the mA and/or kV according to patient size and/or use of iterative reconstruction technique.  CONTRAST: 196m OMNIPAQUE IOHEXOL 300 MG/ML SOLN additional oral enteric contrast  COMPARISON: CT chest, 03/24/2021, PET-CT, 12/26/2018  FINDINGS: CT CHEST FINDINGS  Cardiovascular: Right chest port catheter. Normal heart size. No pericardial effusion.  Mediastinum/Nodes: No enlarged mediastinal, hilar, or axillary lymph nodes. Thyroid gland, trachea, and esophagus demonstrate no significant  findings.  Lungs/Pleura: Mild subpleural radiation fibrosis of the anterior upper lobes. No pleural effusion or pneumothorax.  Musculoskeletal: Status post bilateral mastectomy and axillary lymph node dissection. Unchanged fluid collection in the left mastectomy bed (series 2, image 26). New soft tissue nodules adjacent to this collection and the left pectoral musculature, superiorly measuring 0.9 x 0.6 cm (series 2, image 22) and inferiorly measuring 0.5 cm (series 2, image 28). No acute osseous findings.  CT ABDOMEN PELVIS FINDINGS  Hepatobiliary: No focal liver abnormality is seen. Status post cholecystectomy. Mild postoperative biliary dilatation.  Pancreas: Unremarkable. No pancreatic ductal dilatation or surrounding inflammatory changes.  Spleen: Normal in size without significant abnormality.  Adrenals/Urinary Tract: Adrenal glands are unremarkable. Simple, benign right renal cortical cysts, for which no further follow-up or characterization is required. Kidneys are otherwise normal, without renal calculi, solid lesion, or hydronephrosis. Bladder is unremarkable.  Stomach/Bowel: Stomach is within normal limits. Appendix appears normal. No evidence of bowel wall thickening, distention, or inflammatory changes. Descending and sigmoid diverticulosis.  Vascular/Lymphatic: Scattered aortic atherosclerosis. No enlarged abdominal or pelvic lymph nodes.  Reproductive: Calcified uterine fibroids. Calcified exophytic fibroid or right adnexal lesion, unchanged compared to prior examinations and not previously PET avid, benign (series 2, image 102).  Other: No abdominal wall hernia or abnormality. No ascites.  Musculoskeletal: No acute osseous findings.  IMPRESSION: 1. Status post bilateral mastectomy and axillary lymph  node dissection. New soft tissue nodules adjacent to a postoperative fluid collection and left pectoral musculature, measuring up to 0.9 cm. These are  highly concerning for locally recurrent malignancy. PET-CT may be used to assess for metabolic activity. 2. No other evidence of recurrent or metastatic disease in the chest, abdomen, or pelvis. 3. Calcified exophytic fibroid or right adnexal lesion, unchanged compared to prior examinations, not previously PET avid and benign. 4. Diverticulosis without evidence of acute diverticulitis.  Aortic Atherosclerosis (ICD10-I70.0).   Electronically Signed By: Delanna Ahmadi M.D. On: 04/04/2022 06:47  Diagnoses and all orders for this visit:  Mass of chest wall, left   History of inflammatory breast cancer stage IV 2004  History of bilateral mastectomies with radiation therapy  Stage IV residual cancer being managed medically breast  Discussed removal of the chest wall nodule. This will enable oncology to better treat her. Recommend excision of the mass in the left chest wall for diagnostic purposes and also to manage her therapy. Risk of bleeding, infection, recurrence, death, DVT, exacerbation of underlying medical problems, cardiovascular risk, poor wound healing due to previous radiation exposure discussed as well. Discussed other modalities of biopsy. After discussion of all this she agrees to proceed with left breast chest wall mass resection.  No follow-ups on file.  Kennieth Francois, MD

## 2022-05-10 NOTE — Interval H&P Note (Signed)
History and Physical Interval Note:  05/10/2022 12:45 PM  [name]  has presented today for surgery, with the diagnosis of LEFT CHEST WALL MASS.  The various methods of treatment have been discussed with the patient and family. After consideration of risks, benefits and other options for treatment, the patient has consented to  Procedure(s): EXCISION OF LEFT CHEST WALL MASS (Left) as a surgical intervention.  The patient's history has been reviewed, patient examined, no change in status, stable for surgery.  I have reviewed the patient's chart and labs.  Questions were answered to the patient's satisfaction.     [facility]

## 2022-05-10 NOTE — Op Note (Signed)
Left left chest wall mass measuring 3 cm x 4 cm with previous history of left mastectomy and stage IV left breast cancer  Postop diagnosis: Same  Procedure: Excision of left chest wall mass measuring 8 cm x 4 cm subcutaneous  and intramuscular   Surgeon: Erroll Luna, MD anesthesia: General with 0.25% Marcaine with epinephrine  EBL: Minimal  Specimen: As above  Indications for procedure: The patient is a 71-year-old female with a longstanding history of stage IV breast cancer.  She developed a new soft tissue mass that was hypermetabolic on PET scan involving her left mastectomy wound.  There is also a small chronic seroma with it.  She had very stable disease and being medically managed.  This is her only activity besides her stable other metastases that have not changed in many years.  Since the area is new excision is recommended to further evaluate this.The procedure has been discussed with the patient.  Alternative therapies have been discussed with the patient.  Operative risks include bleeding,  Infection,  Organ injury,  Nerve injury,  Blood vessel injury,  DVT,  Pulmonary embolism,  Death,  And possible reoperation.  Medical management risks include worsening of present situation.  The success of the procedure is 50 -90 % at treating patients symptoms.  The patient understands and agrees to proceed.     Description of procedure: The patient was met in the holding area.  Questions were answered.  Left chest was marked as the correct site.  She was then taken back to the operating room.  She was placed upon upon the OR table.  After induction of general anesthesia, left chest was prepped and draped in sterile fashion and timeout performed.  Proper patient, site and procedure verified.  The site was marked in the holding area of note.  PET scans were available for review as well.  The mass was palpated.  Elliptical incision was made involving the skin of the old mastectomy scar starting in  the mid scar and proceeding more to the axilla.  The mass and fluid collection that were documented were encountered.  This was a white thick mass that extended into the pectoralis muscle.  This was excised taking some the fibers of the pectoralis muscle with it to try to get a wide excision as.  This was removed.  Hemostasis was achieved with cautery.  The subcutaneous tissues were then reapproximated with 3-0 Vicryl.  4 Monocryl was used to close the skin in a subcuticular fashion.  Dermabond applied.  Breast binder placed.  All counts were found to be correct.  The patient was awoke extubated taken to recovery in satisfactory condition.

## 2022-05-10 NOTE — Transfer of Care (Signed)
Immediate Anesthesia Transfer of Care Note  Patient: Doristine Devoid  Procedure(s) Performed: EXCISION OF LEFT CHEST WALL MASS (Left: Chest)  Patient Location: PACU  Anesthesia Type:General  Level of Consciousness: awake, alert , oriented and patient cooperative  Airway & Oxygen Therapy: Patient Spontanous Breathing  Post-op Assessment: Report given to RN and Post -op Vital signs reviewed and stable  Post vital signs: Reviewed and stable  Last Vitals:  Vitals Value Taken Time  BP 138/66 05/10/22 1422  Temp    Pulse 62 05/10/22 1422  Resp 10 05/10/22 1422  SpO2 97 % 05/10/22 1422  Vitals shown include unvalidated device data.  Last Pain:  Vitals:   05/10/22 1002  TempSrc:   PainSc: 5          Complications: No notable events documented.

## 2022-05-10 NOTE — Anesthesia Postprocedure Evaluation (Signed)
Anesthesia Post Note  Patient: [name]  Procedure(s) Performed: EXCISION OF LEFT CHEST WALL MASS (Left: Chest)     Patient location during evaluation: PACU Anesthesia Type: General Level of consciousness: awake and alert Pain management: pain level controlled Vital Signs Assessment: post-procedure vital signs reviewed and stable Respiratory status: spontaneous breathing, nonlabored ventilation and respiratory function stable Cardiovascular status: blood pressure returned to baseline and stable Postop Assessment: no apparent nausea or vomiting Anesthetic complications: no   No notable events documented.  Last Vitals:  Vitals:   05/10/22 1435 05/10/22 1450  BP: (!) 131/56 139/76  Pulse: (!) 58 60  Resp: 16 15  Temp:  36.7 C  SpO2: 95% 93%    Last Pain:  Vitals:   05/10/22 1450  TempSrc:   PainSc: 4                  Lidia Collum

## 2022-05-10 NOTE — Discharge Instructions (Addendum)
#######################################################  GENERAL SURGERY: POST OP INSTRUCTIONS  ######################################################################  EAT Gradually transition to a high fiber diet with a fiber supplement over the next few weeks after discharge.  Start with a pureed / full liquid diet (see below)  WALK Walk an hour a day.  Control your pain to do that.    CONTROL PAIN Control pain so that you can walk, sleep, tolerate sneezing/coughing, go up/down stairs.  HAVE A BOWEL MOVEMENT DAILY Keep your bowels regular to avoid problems.  OK to try a laxative to override constipation.  OK to use an antidairrheal to slow down diarrhea.  Call if not better after 2 tries  CALL IF YOU HAVE PROBLEMS/CONCERNS Call if you are still struggling despite following these instructions. Call if you have concerns not answered by these instructions  ######################################################################    DIET: Follow a light bland diet & liquids the first 24 hours after arrival home, such as soup, liquids, starches, etc.  Be sure to drink plenty of fluids.  Quickly advance to a usual solid diet within a few days.  Avoid fast food or heavy meals as your are more likely to get nauseated or have irregular bowels.  A low-fat, high-fiber diet for the rest of your life is ideal.    Take your usually prescribed home medications unless otherwise directed.  PAIN CONTROL: Pain is best controlled by a usual combination of three different methods TOGETHER: Ice/Heat Over the counter pain medication Prescription pain medication Most patients will experience some swelling and bruising around the incisions.  Ice packs or heating pads (30-60 minutes up to 6 times a day) will help. Use ice for the first few days to help decrease swelling and bruising, then switch to heat to help relax tight/sore spots and speed recovery.  Some people prefer to use ice alone, heat alone,  alternating between ice & heat.  Experiment to what works for you.  Swelling and bruising can take several weeks to resolve.   It is helpful to take an over-the-counter pain medication regularly for the first few weeks.  Choose one of the following that works best for you: Naproxen (Aleve, etc)  Two 220mg tabs twice a day Ibuprofen (Advil, etc) Three 200mg tabs four times a day (every meal & bedtime) Acetaminophen (Tylenol, etc) 500-650mg four times a day (every meal & bedtime) A  prescription for pain medication (such as oxycodone, hydrocodone, etc) should be given to you upon discharge.  Take your pain medication as prescribed.  If you are having problems/concerns with the prescription medicine (does not control pain, nausea, vomiting, rash, itching, etc), please call us [phone] to see if we need to switch you to a different pain medicine that will work better for you and/or control your side effect better. If you need a refill on your pain medication, please contact your pharmacy.  They will contact our office to request authorization. Prescriptions will not be filled after 5 pm or on week-ends.  Avoid getting constipated.  Between the surgery and the pain medications, it is common to experience some constipation.  Increasing fluid intake and taking a fiber supplement (such as Metamucil, Citrucel, FiberCon, MiraLax, etc) 1-2 times a day regularly will usually help prevent this problem from occurring.  A mild laxative (prune juice, Milk of Magnesia, MiraLax, etc) should be taken according to package directions if there are no bowel movements after 48 hours.   Watch out for diarrhea.  If you have many loose bowel movements, simplify your   diet to bland foods & liquids for a few days.  Stop any stool softeners and decrease your fiber supplement.  Switching to mild anti-diarrheal medications (Loperamide/Imodium, Kayopectate, Pepto Bismol) can help.  If this worsens or does not improve, please call  us.  Wash / shower every day.  You may shower over the dressings as they are waterproof.  Continue to shower over incision(s) after the dressing is off. Remove your waterproof bandages 5 days after surgery.  You may leave the incision open to air.  You may have skin tapes (Steri Strips) covering the incision(s).  Leave them on until one week, then remove.  You may replace a dressing/Band-Aid to cover the incision for comfort if you wish.   ACTIVITIES as tolerated:   You may resume regular (light) daily activities beginning the next day--such as daily self-care, walking, climbing stairs--gradually increasing activities as tolerated.  If you can walk 30 minutes without difficulty, it is safe to try more intense activity such as jogging, treadmill, bicycling, low-impact aerobics, swimming, etc. Save the most intensive and strenuous activity for last such as sit-ups, heavy lifting, contact sports, etc  Refrain from any heavy lifting or straining until you are off narcotics for pain control.   DO NOT PUSH THROUGH PAIN.  Let pain be your guide: If it hurts to do something, don't do it.  Pain is your body warning you to avoid that activity for another week until the pain goes down. You may drive when you are no longer taking prescription pain medication, you can comfortably wear a seatbelt, and you can safely maneuver your car and apply brakes. You may have sexual intercourse when it is comfortable.   FOLLOW UP in our office Please call [facility] at [phone] to set up an appointment to see your surgeon in the office for a follow-up appointment approximately 2-3 weeks after your surgery. Make sure that you call for this appointment the day you arrive home to insure a convenient appointment time.  9. IF YOU HAVE DISABILITY OR FAMILY LEAVE FORMS, BRING THEM TO THE OFFICE FOR PROCESSING.  DO NOT GIVE THEM TO YOUR DOCTOR.   WHEN TO CALL us [phone]: Poor pain control Reactions / problems with new  medications (rash/itching, nausea, etc)  Fever over 101.5 F (38.5 C) Worsening swelling or bruising Continued bleeding from incision. Increased pain, redness, or drainage from the incision Difficulty breathing / swallowing   The clinic staff is available to answer your questions during regular business hours (8:30am-5pm).  Please don't hesitate to call and ask to speak to one of our nurses for clinical concerns.   If you have a medical emergency, go to the nearest emergency room or call 911.  A surgeon from [facility] is always on call at the [facility], [place], [place], [place], [place]  [address] ? MAIN: [phone] ? TOLL FREE: [phone] ?  FAX [phone] [email or web address]  #######################################################        RESTART COUMADIN IN 48 HOURS  WEAR BINDER FOR NEXT 10 DAYS OK TO SHOWER RESUME FULL ACTIVITY

## 2022-05-10 NOTE — Anesthesia Procedure Notes (Signed)
Procedure Name: LMA Insertion Date/Time: 05/10/2022 1:17 PM  Performed by: Sammie Bench, CRNAPre-anesthesia Checklist: Patient identified, Emergency Drugs available, Suction available and Patient being monitored Patient Re-evaluated:Patient Re-evaluated prior to induction Oxygen Delivery Method: Circle System Utilized Preoxygenation: Pre-oxygenation with 100% oxygen Induction Type: IV induction Ventilation: Mask ventilation without difficulty LMA: LMA inserted LMA Size: 4.0 Number of attempts: 1 Airway Equipment and Method: Bite block Placement Confirmation: positive ETCO2 Tube secured with: Tape Dental Injury: Teeth and Oropharynx as per pre-operative assessment

## 2022-05-11 ENCOUNTER — Encounter (HOSPITAL_COMMUNITY): Payer: Self-pay | Admitting: Surgery

## 2022-05-11 ENCOUNTER — Other Ambulatory Visit: Payer: Self-pay

## 2022-05-14 LAB — SURGICAL PATHOLOGY

## 2022-05-15 ENCOUNTER — Telehealth: Payer: Self-pay

## 2022-05-15 NOTE — Telephone Encounter (Signed)
Pt called and asked about pathology results from recent excision with Dr Brantley Stage. Advised pt pathology report shows negative for malignancy and it was a seroma. Explained to pt what a seroma is and she states surgical site is tender but feeling ok. She knows to call with any further concerns.

## 2022-05-18 ENCOUNTER — Telehealth: Payer: Self-pay | Admitting: *Deleted

## 2022-05-18 DIAGNOSIS — I10 Essential (primary) hypertension: Secondary | ICD-10-CM | POA: Diagnosis not present

## 2022-05-18 DIAGNOSIS — C50912 Malignant neoplasm of unspecified site of left female breast: Secondary | ICD-10-CM | POA: Diagnosis not present

## 2022-05-18 DIAGNOSIS — K219 Gastro-esophageal reflux disease without esophagitis: Secondary | ICD-10-CM | POA: Diagnosis not present

## 2022-05-18 DIAGNOSIS — C7951 Secondary malignant neoplasm of bone: Secondary | ICD-10-CM | POA: Diagnosis not present

## 2022-05-18 DIAGNOSIS — C50911 Malignant neoplasm of unspecified site of right female breast: Secondary | ICD-10-CM | POA: Diagnosis not present

## 2022-05-18 DIAGNOSIS — C787 Secondary malignant neoplasm of liver and intrahepatic bile duct: Secondary | ICD-10-CM | POA: Diagnosis not present

## 2022-05-18 NOTE — Telephone Encounter (Signed)
[name] from [facility], pt INR 1.1. Per Dr.Gudena, advised pt to increase Warfarin to 5 mg daily and recheck in 1 week 9/29. Pt verbalized understanding. [name] with [facility] has been notified of plan.

## 2022-05-23 ENCOUNTER — Telehealth: Payer: Self-pay | Admitting: *Deleted

## 2022-05-23 NOTE — Telephone Encounter (Signed)
Received call from pt with complaint of continued bleeding from recent surgery incision site.  Per MD pt needing to have INR checked today and f/u with surgeon.  Starla with [facility] notified and states she will alert office of INR results today.  Pt states she has contacted Dr. Donette Larry office and is waiting for a return call to be seen.

## 2022-05-24 ENCOUNTER — Ambulatory Visit: Payer: Medicare Other

## 2022-05-24 ENCOUNTER — Other Ambulatory Visit: Payer: Medicare Other

## 2022-05-24 ENCOUNTER — Other Ambulatory Visit: Payer: Self-pay | Admitting: Hematology and Oncology

## 2022-05-25 ENCOUNTER — Telehealth: Payer: Self-pay

## 2022-05-25 ENCOUNTER — Other Ambulatory Visit: Payer: Self-pay | Admitting: *Deleted

## 2022-05-25 DIAGNOSIS — C7951 Secondary malignant neoplasm of bone: Secondary | ICD-10-CM | POA: Diagnosis not present

## 2022-05-25 DIAGNOSIS — C787 Secondary malignant neoplasm of liver and intrahepatic bile duct: Secondary | ICD-10-CM | POA: Diagnosis not present

## 2022-05-25 DIAGNOSIS — I10 Essential (primary) hypertension: Secondary | ICD-10-CM | POA: Diagnosis not present

## 2022-05-25 DIAGNOSIS — K219 Gastro-esophageal reflux disease without esophagitis: Secondary | ICD-10-CM | POA: Diagnosis not present

## 2022-05-25 DIAGNOSIS — C50911 Malignant neoplasm of unspecified site of right female breast: Secondary | ICD-10-CM

## 2022-05-25 DIAGNOSIS — I871 Compression of vein: Secondary | ICD-10-CM

## 2022-05-25 DIAGNOSIS — C50912 Malignant neoplasm of unspecified site of left female breast: Secondary | ICD-10-CM | POA: Diagnosis not present

## 2022-05-25 NOTE — Progress Notes (Signed)
Received call from [facility] stating pt INR 1.8 today.  RN also states pt wound continues to bleed and has  tunneled.  Pt followed up with surgeon and wound is being packed daily wet to dry.  Per MD pt needing to hold Warfarin x4 weeks to allow for wound to heal.  Pt education and verbalized understanding.

## 2022-05-25 NOTE — Telephone Encounter (Signed)
Called and spoke with [name], per [facility] RN, INR was 1.8 today.  As per Dr Lindi Adie we will hold coumadin at this time to allow her incision site to heal since it has been bleeding.  I informed pt of these instructions and she verbalized understanding.  [place] RN to continue weekly visits for dressing changes and pt aware of upcoming treatment appointments - appt for 11/2 we will discuss if coumadin should be resumed.

## 2022-05-28 ENCOUNTER — Ambulatory Visit: Payer: Medicare Other | Admitting: Hematology and Oncology

## 2022-05-29 DIAGNOSIS — C7951 Secondary malignant neoplasm of bone: Secondary | ICD-10-CM | POA: Diagnosis not present

## 2022-05-29 DIAGNOSIS — C787 Secondary malignant neoplasm of liver and intrahepatic bile duct: Secondary | ICD-10-CM | POA: Diagnosis not present

## 2022-05-29 DIAGNOSIS — K219 Gastro-esophageal reflux disease without esophagitis: Secondary | ICD-10-CM | POA: Diagnosis not present

## 2022-05-29 DIAGNOSIS — M1990 Unspecified osteoarthritis, unspecified site: Secondary | ICD-10-CM | POA: Diagnosis not present

## 2022-05-29 DIAGNOSIS — Z7951 Long term (current) use of inhaled steroids: Secondary | ICD-10-CM | POA: Diagnosis not present

## 2022-05-29 DIAGNOSIS — Z79891 Long term (current) use of opiate analgesic: Secondary | ICD-10-CM | POA: Diagnosis not present

## 2022-05-29 DIAGNOSIS — Z9013 Acquired absence of bilateral breasts and nipples: Secondary | ICD-10-CM | POA: Diagnosis not present

## 2022-05-29 DIAGNOSIS — I10 Essential (primary) hypertension: Secondary | ICD-10-CM | POA: Diagnosis not present

## 2022-05-29 DIAGNOSIS — C50911 Malignant neoplasm of unspecified site of right female breast: Secondary | ICD-10-CM | POA: Diagnosis not present

## 2022-05-29 DIAGNOSIS — I871 Compression of vein: Secondary | ICD-10-CM | POA: Diagnosis not present

## 2022-05-29 DIAGNOSIS — Z7901 Long term (current) use of anticoagulants: Secondary | ICD-10-CM | POA: Diagnosis not present

## 2022-05-29 DIAGNOSIS — Z5181 Encounter for therapeutic drug level monitoring: Secondary | ICD-10-CM | POA: Diagnosis not present

## 2022-05-29 DIAGNOSIS — Z9181 History of falling: Secondary | ICD-10-CM | POA: Diagnosis not present

## 2022-05-29 DIAGNOSIS — C50912 Malignant neoplasm of unspecified site of left female breast: Secondary | ICD-10-CM | POA: Diagnosis not present

## 2022-05-29 DIAGNOSIS — Z7952 Long term (current) use of systemic steroids: Secondary | ICD-10-CM | POA: Diagnosis not present

## 2022-05-31 ENCOUNTER — Other Ambulatory Visit: Payer: Medicare Other

## 2022-05-31 ENCOUNTER — Inpatient Hospital Stay: Payer: Medicare Other

## 2022-05-31 ENCOUNTER — Inpatient Hospital Stay: Payer: Medicare Other | Admitting: Hematology and Oncology

## 2022-05-31 ENCOUNTER — Inpatient Hospital Stay: Payer: Medicare Other | Attending: Oncology

## 2022-05-31 ENCOUNTER — Inpatient Hospital Stay (HOSPITAL_BASED_OUTPATIENT_CLINIC_OR_DEPARTMENT_OTHER): Payer: Medicare Other | Admitting: Hematology and Oncology

## 2022-05-31 ENCOUNTER — Other Ambulatory Visit: Payer: Self-pay

## 2022-05-31 DIAGNOSIS — Z7901 Long term (current) use of anticoagulants: Secondary | ICD-10-CM | POA: Diagnosis not present

## 2022-05-31 DIAGNOSIS — C50812 Malignant neoplasm of overlapping sites of left female breast: Secondary | ICD-10-CM | POA: Diagnosis not present

## 2022-05-31 DIAGNOSIS — Z95828 Presence of other vascular implants and grafts: Secondary | ICD-10-CM

## 2022-05-31 DIAGNOSIS — C50911 Malignant neoplasm of unspecified site of right female breast: Secondary | ICD-10-CM

## 2022-05-31 DIAGNOSIS — C50012 Malignant neoplasm of nipple and areola, left female breast: Secondary | ICD-10-CM

## 2022-05-31 DIAGNOSIS — Z5112 Encounter for antineoplastic immunotherapy: Secondary | ICD-10-CM | POA: Insufficient documentation

## 2022-05-31 DIAGNOSIS — C787 Secondary malignant neoplasm of liver and intrahepatic bile duct: Secondary | ICD-10-CM | POA: Diagnosis not present

## 2022-05-31 DIAGNOSIS — Z171 Estrogen receptor negative status [ER-]: Secondary | ICD-10-CM | POA: Diagnosis not present

## 2022-05-31 DIAGNOSIS — I871 Compression of vein: Secondary | ICD-10-CM | POA: Insufficient documentation

## 2022-05-31 DIAGNOSIS — C7951 Secondary malignant neoplasm of bone: Secondary | ICD-10-CM | POA: Diagnosis not present

## 2022-05-31 DIAGNOSIS — C50919 Malignant neoplasm of unspecified site of unspecified female breast: Secondary | ICD-10-CM

## 2022-05-31 LAB — CMP (CANCER CENTER ONLY)
ALT: 10 U/L (ref 0–44)
AST: 12 U/L — ABNORMAL LOW (ref 15–41)
Albumin: 3.5 g/dL (ref 3.5–5.0)
Alkaline Phosphatase: 81 U/L (ref 38–126)
Anion gap: 13 (ref 5–15)
BUN: 30 mg/dL — ABNORMAL HIGH (ref 8–23)
CO2: 22 mmol/L (ref 22–32)
Calcium: 9.2 mg/dL (ref 8.9–10.3)
Chloride: 99 mmol/L (ref 98–111)
Creatinine: 1.45 mg/dL — ABNORMAL HIGH (ref 0.44–1.00)
GFR, Estimated: 39 mL/min — ABNORMAL LOW (ref >60)
Glucose, Bld: 112 mg/dL — ABNORMAL HIGH (ref 70–99)
Potassium: 4.5 mmol/L (ref 3.5–5.1)
Sodium: 134 mmol/L — ABNORMAL LOW (ref 135–145)
Total Bilirubin: 1.1 mg/dL (ref 0.3–1.2)
Total Protein: 8 g/dL (ref 6.5–8.1)

## 2022-05-31 LAB — CBC WITH DIFFERENTIAL (CANCER CENTER ONLY)
Abs Immature Granulocytes: 0.03 10*3/uL (ref 0.00–0.07)
Basophils Absolute: 0 10*3/uL (ref 0.0–0.1)
Basophils Relative: 0 %
Eosinophils Absolute: 0 10*3/uL (ref 0.0–0.5)
Eosinophils Relative: 0 %
HCT: 30.1 % — ABNORMAL LOW (ref 36.0–46.0)
Hemoglobin: 10.6 g/dL — ABNORMAL LOW (ref 12.0–15.0)
Immature Granulocytes: 0 %
Lymphocytes Relative: 12 %
Lymphs Abs: 1.1 10*3/uL (ref 0.7–4.0)
MCH: 27.2 pg (ref 26.0–34.0)
MCHC: 35.2 g/dL (ref 30.0–36.0)
MCV: 77.2 fL — ABNORMAL LOW (ref 80.0–100.0)
Monocytes Absolute: 0.3 10*3/uL (ref 0.1–1.0)
Monocytes Relative: 3 %
Neutro Abs: 8.2 10*3/uL — ABNORMAL HIGH (ref 1.7–7.7)
Neutrophils Relative %: 85 %
Platelet Count: 331 10*3/uL (ref 150–400)
RBC: 3.9 MIL/uL (ref 3.87–5.11)
RDW: 14.9 % (ref 11.5–15.5)
WBC Count: 9.6 10*3/uL (ref 4.0–10.5)
nRBC: 0 % (ref 0.0–0.2)

## 2022-05-31 MED ORDER — HEPARIN SOD (PORK) LOCK FLUSH 100 UNIT/ML IV SOLN
500.0000 [IU] | Freq: Once | INTRAVENOUS | Status: AC | PRN
  Administered 2022-05-31: 500 [IU]

## 2022-05-31 MED ORDER — SODIUM CHLORIDE 0.9% FLUSH
10.0000 mL | INTRAVENOUS | Status: DC | PRN
  Administered 2022-05-31: 10 mL

## 2022-05-31 MED ORDER — TRASTUZUMAB-DKST CHEMO 150 MG IV SOLR
750.0000 mg | Freq: Once | INTRAVENOUS | Status: AC
  Administered 2022-05-31: 750 mg via INTRAVENOUS
  Filled 2022-05-31: qty 35.72

## 2022-05-31 MED ORDER — ACETAMINOPHEN 325 MG PO TABS
650.0000 mg | ORAL_TABLET | Freq: Once | ORAL | Status: AC
  Administered 2022-05-31: 650 mg via ORAL
  Filled 2022-05-31: qty 2

## 2022-05-31 MED ORDER — LORAZEPAM 2 MG/ML IJ SOLN
1.0000 mg | Freq: Once | INTRAMUSCULAR | Status: AC | PRN
  Administered 2022-05-31: 1 mg via INTRAVENOUS
  Filled 2022-05-31: qty 1

## 2022-05-31 MED ORDER — DIPHENHYDRAMINE HCL 25 MG PO CAPS
25.0000 mg | ORAL_CAPSULE | Freq: Once | ORAL | Status: AC
  Administered 2022-05-31: 25 mg via ORAL
  Filled 2022-05-31: qty 1

## 2022-05-31 MED ORDER — HEPARIN SOD (PORK) LOCK FLUSH 100 UNIT/ML IV SOLN: 500.0000 [IU] | Freq: Once | INTRAVENOUS | Status: DC | PRN

## 2022-05-31 MED ORDER — SODIUM CHLORIDE 0.9 % IV SOLN: Freq: Once | INTRAVENOUS | Status: AC

## 2022-05-31 NOTE — Assessment & Plan Note (Deleted)
2004: Liver. Lung and bone mets Neoadj chemo TCH X 6completed April 2064fll by herceptin maintenance  bilateral mastectomieswith bilateral axillary lymph node dissection 12/07/2004, showing (a) on the right, a mypT1c ypN1 invasive ductal carcinoma, grade 3, estrogen and progesterone receptor negative, HER-2 positive, with an MIB-1 of 31% (b) on the left, ypT2 ypN1 invasive ductal carcinoma, grade 2, estrogen and progesterone receptor negative, HER-2 positive, with an MIB-1 of 35%.  Adj XRT ---------------------------------------------------------------------------------------------------------------------------------------- Current treatment:Herceptin maintenance therapy Plan to continue Herceptin indefinitely every 28 days.  CT chest abdomen and pelvis 04/04/2022:New soft tissue nodule suggestive of a postoperative fluid collection measuring 0.9 cm concerning for local recurrence. No other evidence of metastatic disease. Recommendation: Surgical excision biopsy of the subcutaneous nodules  Surgical excision 05/10/2022: No evidence of malignancy.  Fat necrosis/seroma  SVC syndrome: On lifelong anticoagulation.Currently on Coumadin because the Xarelto was not covered by her insurance  Follow-up in 3 weeks to discuss results of surgery Return to clinic for monthly Herceptin infusions

## 2022-05-31 NOTE — Patient Instructions (Signed)
[facility]ICAL [facility]COL[facility]   Discharge Instructions: Thank you for choosing [facility] to provide your oncology and hematology care.   If you have a lab appointment with the Cancer Center, please go directly to the Cancer Center and check in at the registration area.   Wear comfortable clothing and clothing appropriate for easy access to any Portacath or PICC line.   We strive to give you quality time with your provider. You may need to reschedule your appointment if you arrive late (15 or more minutes).  Arriving late affects you and other patients whose appointments are after yours.  Also, if you miss three or more appointments without notifying the office, you may be dismissed from the clinic at the provider's discretion.      For prescription refill requests, have your pharmacy contact our office and allow 72 hours for refills to be completed.    Today you received the following chemotherapy and/or immunotherapy agents: [id number]      To help prevent nausea and vomiting after your treatment, we encourage you to take your nausea medication as directed.  BELOW ARE SYMPTOMS THAT SHOULD BE REPORTED IMMEDIATELY: *FEVER GREATER THAN 100.4 F (38 C) OR HIGHER *CHILLS OR SWEATING *NAUSEA AND VOMITING THAT IS NOT CONTROLLED WITH YOUR NAUSEA MEDICATION *UNUSUAL SHORTNESS OF BREATH *UNUSUAL BRUISING OR BLEEDING *URINARY PROBLEMS (pain or burning when urinating, or frequent urination) *BOWEL PROBLEMS (unusual diarrhea, constipation, pain near the anus) TENDERNESS IN MOUTH AND THROAT WITH OR WITHOUT PRESENCE OF ULCERS (sore throat, sores in mouth, or a toothache) UNUSUAL RASH, SWELLING OR PAIN  UNUSUAL VAGINAL DISCHARGE OR ITCHING   Items with * indicate a potential emergency and should be followed up as soon as possible or go to the Emergency Department if any problems should occur.  Please show the CHEMOTHERAPY ALERT CARD or IMMUNOTHERAPY ALERT CARD at  check-in to the Emergency Department and triage nurse.  Should you have questions after your visit or need to cancel or reschedule your appointment, please contact [facility]  Dept: [phone]  and follow the prompts.  Office hours are 8:00 a.m. to 4:30 p.m. Monday - Friday. Please note that voicemails left after 4:00 p.m. may not be returned until the following business day.  We are closed weekends and major holidays. You have access to a nurse at all times for urgent questions. Please call the main number to the clinic Dept: [phone] and follow the prompts.   For any non-urgent questions, you may also contact your provider using MyChart. We now offer e-Visits for anyone 18 and older to request care online for non-urgent symptoms. For details visit [email or web address].   Also download the MyChart app! Go to the app store, search "MyChart", open the app, select [facility], and log in with your MyChart username and password.  Masks are optional in the cancer centers. If you would like for your care team to wear a mask while they are taking care of you, please let them know. You may have one support person who is at least 70 years old accompany you for your appointments. 

## 2022-05-31 NOTE — Assessment & Plan Note (Signed)
2004: Liver. Lung and bone mets Neoadj chemo TCH X 6completed April 2025fll by herceptin maintenance  bilateral mastectomieswith bilateral axillary lymph node dissection 12/07/2004, showing (a) on the right, a mypT1c ypN1 invasive ductal carcinoma, grade 3, estrogen and progesterone receptor negative, HER-2 positive, with an MIB-1 of 31% (b) on the left, ypT2 ypN1 invasive ductal carcinoma, grade 2, estrogen and progesterone receptor negative, HER-2 positive, with an MIB-1 of 35%.  Adj XRT ---------------------------------------------------------------------------------------------------------------------------------------- Current treatment:Herceptin maintenance therapy Plan to continue Herceptin indefinitely every 28 days.  CT chest abdomen and pelvis 04/04/2022:New soft tissue nodule suggestive of a postoperative fluid collection measuring 0.9 cm concerning for local recurrence. No other evidence of metastatic disease. Recommendation: Surgical excision biopsy of the subcutaneous nodules  Surgical excision 05/10/2022: No evidence of malignancy.  Fat necrosis/seroma  SVC syndrome: On lifelong anticoagulation.Coumadin on hold because of recent surgery   Poor wound healing: I discussed the case with Dr. CBrantley Stagewho will be in touch with her to get her in for better ways to help with the healing of the surgical wound.  Because the area where the wound exists has been previously radiated it may be taking much longer to heal.  Patient is extremely unhappy about the wound care situation.  Dr. CJosetta Huddleoffice will be in touch with her to get her in to be seen.  Follow-up in 3 weeks to discuss results of surgery Return to clinic for monthly Herceptin infusions

## 2022-05-31 NOTE — Progress Notes (Signed)
Patient Care Team: Ernestene Kiel, MD as PCP - General (Internal Medicine) Haze Rushing., MD as Consulting Physician (Pain Medicine) Joya Salm, MD as Referring Physician (Orthopedic Surgery) Jaymes Graff, DO as Consulting Physician (Orthopedic Surgery) Larey Dresser, MD as Consulting Physician (Cardiology) Bensimhon, Shaune Pascal, MD as Consulting Physician (Cardiology) Lorelle Gibbs, MD (Radiology) Tommy Medal, Lavell Islam, MD as Consulting Physician (Infectious Diseases) Gery Pray, MD as Consulting Physician (Radiation Oncology) Nicholas Lose, MD as Consulting Physician (Hematology and Oncology)  DIAGNOSIS:  Encounter Diagnosis  Name Primary?   Carcinoma of right breast metastatic to multiple sites ([place])     SUMMARY OF ONCOLOGIC HISTORY: Oncology History  Breast cancer metastasized to multiple sites [facility])  02/26/2013 Initial Diagnosis   history of inflammatory right breast cancer metastatic at presentation September 2004 with involvement of liver and bone, HER-2 positive, estrogen and progesterone receptor negative      - 11/2013 Chemotherapy   carboplatin, docetaxel and Herceptin x 6 completed April 2005     Surgery   bilateral mastectomies with bilateral axillary lymph node dissection 12/07/2004, showing             (a) on the right, a mypT1c ypN1 invasive ductal carcinoma, grade 3, estrogen and progesterone receptor negative, HER-2 positive, with an MIB-1 of 31%             (b) on the left, ypT2 ypN1 invasive ductal carcinoma, grade 2, estrogen and progesterone receptor negative, HER-2 positive, with an MIB-1 of 35%.   01/2015 - 02/2015 Radiation Therapy   Adj XRT    - 03/2018 Chemotherapy   Ixampra     CHIEF COMPLIANT: Follow-up of  estrogen receptor negative, but HER2 amplified breast cancer on coumadin and  herceptin maintenance  INTERVAL HISTORY: [name] is a 71 y.o. with the above-mentioned. She presents to the clinic for a follow-up. She  states that she had some rectal bleeding. She reports the incision has got larger. She noticed it wasn't drying up. It is not closing or healing at all.  ALLERGIES:  is allergic to penicillins and adhesive [tape].  MEDICATIONS:  Current Outpatient Medications  Medication Sig Dispense Refill   acetaminophen (TYLENOL) 500 MG tablet Take 1,000 mg by mouth every 6 (six) hours as needed for mild pain or fever.      albuterol (PROVENTIL HFA;VENTOLIN HFA) 108 (90 BASE) MCG/ACT inhaler Inhale 2 puffs into the lungs every 6 (six) hours as needed for wheezing. 1 Inhaler 5   ALPRAZolam (XANAX) 1 MG tablet Take 1 tablet (1 mg total) by mouth as needed for anxiety. 30 tablet 3   amLODipine (NORVASC) 10 MG tablet Take 1 tablet (10 mg total) by mouth every morning. 30 tablet 4   baclofen (LIORESAL) 10 MG tablet TAKE 1 TABLET BY MOUTH THREE TIMES DAILY AS NEEDED FOR MUSCLE SPASMS 270 tablet 0   carvedilol (COREG) 12.5 MG tablet TAKE 1 TABLET(12.5 MG) BY MOUTH TWICE DAILY 60 tablet 11   diclofenac sodium (VOLTAREN) 1 % GEL Apply 2 g topically daily as needed (for pain). Apply to knees and shoulders 100 g 6   DULoxetine (CYMBALTA) 20 MG capsule Take 20 mg by mouth daily.     enoxaparin (LOVENOX) 120 MG/0.8ML injection Inject 0.8 mLs (120 mg total) into the skin every 12 (twelve) hours. Start 5 days before surgery. 8 mL 0   furosemide (LASIX) 40 MG tablet Take 40 mg by mouth as needed.     gabapentin (NEURONTIN)  300 MG capsule TAKE 2 CAPSULES BY MOUTH THREE TIMES DAILY 540 capsule 0   losartan (COZAAR) 100 MG tablet Take 100 mg by mouth every morning.     oxyCODONE-acetaminophen (PERCOCET) 10-325 MG tablet Take 1 tablet by mouth 4 (four) times daily as needed.     potassium chloride SA (KLOR-CON) 20 MEQ tablet Take 1 tablet (20 mEq total) by mouth daily as needed. With use of lasix 30 tablet 3   spironolactone (ALDACTONE) 25 MG tablet TAKE 1 TABLET(25 MG) BY MOUTH DAILY 30 tablet 11   temazepam (RESTORIL) 30 MG  capsule Take 1 capsule (30 mg total) by mouth at bedtime. 30 capsule 3   traMADol (ULTRAM) 50 MG tablet Take 1 tablet (50 mg total) by mouth every 6 (six) hours as needed. 20 tablet 0   warfarin (COUMADIN) 2.5 MG tablet TAKE 1 TABLET(2.5 MG) BY MOUTH DAILY 30 tablet 3   warfarin (COUMADIN) 5 MG tablet Take 1 tablet (5 mg total) by mouth daily. 30 tablet 3   predniSONE (DELTASONE) 5 MG tablet Take 5 mg by mouth daily in the afternoon.     No current facility-administered medications for this visit.   Facility-Administered Medications Ordered in Other Visits  Medication Dose Route Frequency Provider Last Rate Last Admin   sodium chloride flush (NS) 0.9 % injection 10 mL  10 mL Intravenous PRN Magrinat, Virgie Dad, MD   10 mL at 12/15/15 1200   sodium chloride flush (NS) 0.9 % injection 10 mL  10 mL Intracatheter PRN Magrinat, Virgie Dad, MD   10 mL at 08/14/18 1116   sodium chloride flush (NS) 0.9 % injection 10 mL  10 mL Intracatheter PRN Magrinat, Virgie Dad, MD       sodium chloride flush (NS) 0.9 % injection 10 mL  10 mL Intracatheter PRN Nicholas Lose, MD   10 mL at 05/31/22 1353    PHYSICAL EXAMINATION: ECOG PERFORMANCE STATUS: 1 - Symptomatic but completely ambulatory  Vitals:   05/31/22 1158  BP: 123/80  Pulse: 89  Resp: 18  Temp: 97.7 F (36.5 C)  SpO2: 96%   Filed Weights   05/31/22 1158  Weight: 253 lb 6.4 oz (114.9 kg)     LABORATORY DATA:  I have reviewed the data as listed    Latest Ref Rng & Units 05/31/2022   11:38 AM 05/03/2022    8:40 AM 04/05/2022   10:37 AM  CMP  Glucose 70 - 99 mg/dL 112  118  102   BUN 8 - 23 mg/dL '30  17  19   ' Creatinine 0.44 - 1.00 mg/dL 1.45  0.95  0.85   Sodium 135 - 145 mmol/L 134  140  140   Potassium 3.5 - 5.1 mmol/L 4.5  4.2  4.2   Chloride 98 - 111 mmol/L 99  106  105   CO2 22 - 32 mmol/L '22  27  28   ' Calcium 8.9 - 10.3 mg/dL 9.2  9.3  9.3   Total Protein 6.5 - 8.1 g/dL 8.0  7.6  8.1   Total Bilirubin 0.3 - 1.2 mg/dL 1.1  0.7  0.9    Alkaline Phos 38 - 126 U/L 81  74  77   AST 15 - 41 U/L '12  13  12   ' ALT 0 - 44 U/L '10  9  9     ' Lab Results  Component Value Date   WBC 9.6 05/31/2022   HGB 10.6 (L) 05/31/2022  HCT 30.1 (L) 05/31/2022   MCV 77.2 (L) 05/31/2022   PLT 331 05/31/2022   NEUTROABS 8.2 (H) 05/31/2022    ASSESSMENT & PLAN:  Breast cancer metastasized to multiple sites 2004: Liver. Lung and bone mets Neoadj chemo TCH X 6 completed April 2005 foll by herceptin maintenance   bilateral mastectomies with bilateral axillary lymph node dissection 12/07/2004, showing             (a) on the right, a mypT1c ypN1 invasive ductal carcinoma, grade 3, estrogen and progesterone receptor negative, HER-2 positive, with an MIB-1 of 31%             (b) on the left, ypT2 ypN1 invasive ductal carcinoma, grade 2, estrogen and progesterone receptor negative, HER-2 positive, with an MIB-1 of 35%.   Adj XRT ----------------------------------------------------------------------------------------------------------------------------------------  Current treatment: Herceptin maintenance therapy Plan to continue Herceptin indefinitely every 28 days.   CT chest abdomen and pelvis 04/04/2022: New soft tissue nodule suggestive of a postoperative fluid collection measuring 0.9 cm concerning for local recurrence.  No other evidence of metastatic disease. Recommendation: Surgical excision biopsy of the subcutaneous nodules  Surgical excision 05/10/2022: No evidence of malignancy.  Fat necrosis/seroma   SVC syndrome: On lifelong anticoagulation.  Coumadin on hold because of recent surgery   Poor wound healing: I discussed the case with Dr. Brantley Stage who will be in touch with her to get her in for better ways to help with the healing of the surgical wound.  Because the area where the wound exists has been previously radiated it may be taking much longer to heal.  Patient is extremely unhappy about the wound care situation.  Dr. Josetta Huddle  office will be in touch with her to get her in to be seen.   Follow-up in 3 weeks to discuss results of surgery Return to clinic for monthly Herceptin infusions    No orders of the defined types were placed in this encounter.  The patient has a good understanding of the overall plan. she agrees with it. she will call with any problems that may develop before the next visit here. Total time spent: 30 mins including face to face time and time spent for planning, charting and co-ordination of care   Harriette Ohara, MD 05/31/22    I Gardiner Coins am scribing for Dr. Lindi Adie  I have reviewed the above documentation for accuracy and completeness, and I agree with the above.

## 2022-06-04 ENCOUNTER — Telehealth: Payer: Self-pay

## 2022-06-04 NOTE — Telephone Encounter (Signed)
Called and spoke with [facility] RN with [facility] who is going to see pt tomorrow in the home and assess the wound.  She will call our office then and report wound drainage status so that Dr. Lindi Adie can decide when to resume anticoag therapy.

## 2022-06-05 DIAGNOSIS — K219 Gastro-esophageal reflux disease without esophagitis: Secondary | ICD-10-CM | POA: Diagnosis not present

## 2022-06-05 DIAGNOSIS — C50912 Malignant neoplasm of unspecified site of left female breast: Secondary | ICD-10-CM | POA: Diagnosis not present

## 2022-06-05 DIAGNOSIS — C787 Secondary malignant neoplasm of liver and intrahepatic bile duct: Secondary | ICD-10-CM | POA: Diagnosis not present

## 2022-06-05 DIAGNOSIS — C7951 Secondary malignant neoplasm of bone: Secondary | ICD-10-CM | POA: Diagnosis not present

## 2022-06-05 DIAGNOSIS — C50911 Malignant neoplasm of unspecified site of right female breast: Secondary | ICD-10-CM | POA: Diagnosis not present

## 2022-06-05 DIAGNOSIS — I10 Essential (primary) hypertension: Secondary | ICD-10-CM | POA: Diagnosis not present

## 2022-06-06 ENCOUNTER — Encounter: Payer: Self-pay | Admitting: *Deleted

## 2022-06-06 ENCOUNTER — Other Ambulatory Visit: Payer: Self-pay | Admitting: *Deleted

## 2022-06-06 ENCOUNTER — Other Ambulatory Visit: Payer: Self-pay | Admitting: Hematology and Oncology

## 2022-06-06 DIAGNOSIS — I871 Compression of vein: Secondary | ICD-10-CM

## 2022-06-06 DIAGNOSIS — C50919 Malignant neoplasm of unspecified site of unspecified female breast: Secondary | ICD-10-CM

## 2022-06-06 MED ORDER — APIXABAN 5 MG PO TABS: 5.0000 mg | ORAL_TABLET | Freq: Two times a day (BID) | ORAL | 2 refills | Status: DC

## 2022-06-06 MED ORDER — APIXABAN 2.5 MG PO TABS: 5.0000 mg | ORAL_TABLET | Freq: Two times a day (BID) | ORAL | 2 refills | Status: DC

## 2022-06-06 MED ORDER — RIVAROXABAN 20 MG PO TABS: 20.0000 mg | ORAL_TABLET | Freq: Every day | ORAL | 3 refills | Status: DC

## 2022-06-06 MED ORDER — ALPRAZOLAM 1 MG PO TABS: 1.0000 mg | ORAL_TABLET | Freq: Three times a day (TID) | ORAL | 3 refills | Status: DC | PRN

## 2022-06-06 NOTE — Progress Notes (Signed)
Pt current copay for Xarelto is $305 for a 3 month supply. PA team working to see if there is any copay assistance with pt insurance.  Per MD pt needing to restart Warfarin 5 mg p.o daily x7 days a week while PA is pending and recheck INR in 1 week.  Pt educated and verbalized understanding. [facility] RN with [facility] states she will recheck pt INR in 1 week and contact office with results.

## 2022-06-06 NOTE — Progress Notes (Signed)
Per MD pt needing to start Xarelto 20 mg daily due to pt not being therapeutic with Warfarin despite weekly INR monitoring, diet changes, and dosage change.  Prescription sent to pharmacy on file.  PA team notified to work on PA if needed.

## 2022-06-06 NOTE — Progress Notes (Signed)
RN alerted by PA staff that pt is not eligible for copay assistance for Xarelto.  Per MD pt to be prescribed Eliquis 5 mg p.o BID.  Prescription sent to pharmacy on file and PA team notified to work on copay assistance. Pt updated and verbalized understanding.

## 2022-06-07 ENCOUNTER — Other Ambulatory Visit: Payer: Self-pay | Admitting: *Deleted

## 2022-06-08 ENCOUNTER — Encounter: Payer: Self-pay | Admitting: Oncology

## 2022-06-08 ENCOUNTER — Encounter: Payer: Self-pay | Admitting: Hematology and Oncology

## 2022-06-08 ENCOUNTER — Other Ambulatory Visit: Payer: Self-pay

## 2022-06-08 DIAGNOSIS — C50911 Malignant neoplasm of unspecified site of right female breast: Secondary | ICD-10-CM | POA: Diagnosis not present

## 2022-06-08 DIAGNOSIS — C7951 Secondary malignant neoplasm of bone: Secondary | ICD-10-CM | POA: Diagnosis not present

## 2022-06-08 DIAGNOSIS — C787 Secondary malignant neoplasm of liver and intrahepatic bile duct: Secondary | ICD-10-CM | POA: Diagnosis not present

## 2022-06-08 DIAGNOSIS — K219 Gastro-esophageal reflux disease without esophagitis: Secondary | ICD-10-CM | POA: Diagnosis not present

## 2022-06-08 DIAGNOSIS — C50912 Malignant neoplasm of unspecified site of left female breast: Secondary | ICD-10-CM | POA: Diagnosis not present

## 2022-06-08 DIAGNOSIS — I10 Essential (primary) hypertension: Secondary | ICD-10-CM | POA: Diagnosis not present

## 2022-06-08 MED ORDER — APIXABAN 5 MG PO TABS: 5.0000 mg | ORAL_TABLET | Freq: Two times a day (BID) | ORAL | 2 refills | Status: DC

## 2022-06-08 NOTE — Telephone Encounter (Signed)
Called pt, no answer, lvm for return call

## 2022-06-08 NOTE — Telephone Encounter (Signed)
Printing Eliquis Rx for drug assistance forms

## 2022-06-12 DIAGNOSIS — C787 Secondary malignant neoplasm of liver and intrahepatic bile duct: Secondary | ICD-10-CM | POA: Diagnosis not present

## 2022-06-12 DIAGNOSIS — C7951 Secondary malignant neoplasm of bone: Secondary | ICD-10-CM | POA: Diagnosis not present

## 2022-06-12 DIAGNOSIS — C50911 Malignant neoplasm of unspecified site of right female breast: Secondary | ICD-10-CM | POA: Diagnosis not present

## 2022-06-12 DIAGNOSIS — I10 Essential (primary) hypertension: Secondary | ICD-10-CM | POA: Diagnosis not present

## 2022-06-12 DIAGNOSIS — K219 Gastro-esophageal reflux disease without esophagitis: Secondary | ICD-10-CM | POA: Diagnosis not present

## 2022-06-12 DIAGNOSIS — C50912 Malignant neoplasm of unspecified site of left female breast: Secondary | ICD-10-CM | POA: Diagnosis not present

## 2022-06-14 DIAGNOSIS — I10 Essential (primary) hypertension: Secondary | ICD-10-CM | POA: Diagnosis not present

## 2022-06-14 DIAGNOSIS — C787 Secondary malignant neoplasm of liver and intrahepatic bile duct: Secondary | ICD-10-CM | POA: Diagnosis not present

## 2022-06-14 DIAGNOSIS — K219 Gastro-esophageal reflux disease without esophagitis: Secondary | ICD-10-CM | POA: Diagnosis not present

## 2022-06-14 DIAGNOSIS — C50912 Malignant neoplasm of unspecified site of left female breast: Secondary | ICD-10-CM | POA: Diagnosis not present

## 2022-06-14 DIAGNOSIS — C50911 Malignant neoplasm of unspecified site of right female breast: Secondary | ICD-10-CM | POA: Diagnosis not present

## 2022-06-14 DIAGNOSIS — C7951 Secondary malignant neoplasm of bone: Secondary | ICD-10-CM | POA: Diagnosis not present

## 2022-06-19 ENCOUNTER — Telehealth: Payer: Self-pay

## 2022-06-19 NOTE — Telephone Encounter (Signed)
Notified Patient of denied application for assistance with Eliquis through [place] [facility]. Application requires an additional $227.04 in out of pocket prescription expenses for approval.  Patient sent copies of out of pocket expenses for Spouse's prescriptions. Information sent to [place] ID #[id number]. Approval is pending at this time.

## 2022-06-19 NOTE — Telephone Encounter (Signed)
Spoke with Patient regarding [place] Application for assistance with Eliquis. [employer] is requesting tax return documents or Social Security documents for proof of income. Patient states that she has not filed taxes in 20 years and she has no way to get to the Social Security Office to obtain a copy of a letter that provides the income information that is requested. Patient did initial on the application for [employer] to obtain copies of reports for income if needed. This Nurse will follow-up with [place] regarding status of obtaining the financial information needed.

## 2022-06-21 ENCOUNTER — Ambulatory Visit: Payer: Medicare Other

## 2022-06-21 ENCOUNTER — Telehealth: Payer: Self-pay | Admitting: *Deleted

## 2022-06-21 ENCOUNTER — Other Ambulatory Visit: Payer: Medicare Other

## 2022-06-21 NOTE — Telephone Encounter (Signed)
Received call from [place], [facility] RN stating pt has been approved for Eliquis and will receive within 24-48 hrs through the mail.  Per MD pt needing to stop warfarin day before stating Eliquis.  [name] states she will educate pt and plans to check INR tomorrow while waiting for Eliquis to be delivered.

## 2022-06-22 ENCOUNTER — Telehealth: Payer: Self-pay | Admitting: *Deleted

## 2022-06-22 DIAGNOSIS — I10 Essential (primary) hypertension: Secondary | ICD-10-CM | POA: Diagnosis not present

## 2022-06-22 DIAGNOSIS — C7951 Secondary malignant neoplasm of bone: Secondary | ICD-10-CM | POA: Diagnosis not present

## 2022-06-22 DIAGNOSIS — C787 Secondary malignant neoplasm of liver and intrahepatic bile duct: Secondary | ICD-10-CM | POA: Diagnosis not present

## 2022-06-22 DIAGNOSIS — K219 Gastro-esophageal reflux disease without esophagitis: Secondary | ICD-10-CM | POA: Diagnosis not present

## 2022-06-22 DIAGNOSIS — C50912 Malignant neoplasm of unspecified site of left female breast: Secondary | ICD-10-CM | POA: Diagnosis not present

## 2022-06-22 DIAGNOSIS — C50911 Malignant neoplasm of unspecified site of right female breast: Secondary | ICD-10-CM | POA: Diagnosis not present

## 2022-06-22 NOTE — Telephone Encounter (Signed)
Received call from [place] stating pt INR is 5.8 today while on Warfarin 5 mg p.o daily x7 days. Per MD pt needing to hold Warfarin x3 days and re check Monday 06/25/22 with anticipation on changing to Warfarin 2.5 mg p.o daily.  RN contacted pt and verbalized understanding.  Pt states she is still waiting for Eliquis to be delivered and will alert our office once she receives medication.

## 2022-06-22 NOTE — Progress Notes (Unsigned)
Office Visit Note  Patient: [name]             Date of Birth: 12/02/1950           MRN: [id number]             PCP: Ernestene Kiel, MD Referring: Ernestene Kiel, MD Visit Date: 07/04/2022 Occupation: '@GUAROCC'$ @  Subjective:  Chronic pain   History of Present Illness: [name] is a 71 y.o. female with history of osteoarthritis and DDD.  Patient was last seen in the office on 01/03/2022.  X-rays of both hands, both knees, and the C-spine were obtained at her last office visit.  A referral to physical therapy was offered but she declined at that time.  Patient reports that she eventually had home physical therapy twice a week but did not notice any improvement in her mobility or joint pain.  She has been taking oxycodone for pain relief and Tylenol as needed for breakthrough symptoms.  She also uses a heating pad and heated blanket to alleviate her myalgias and arthralgias.  She has been using a rollator walker to assist with ambulation.  In the future she plans on proceeding with knee replacements but is currently under the care of Dr. Brantley Stage and Dr. Lindi Adie.  She had an excision of a left chest wall mass that has led to a nonhealing wound requiring extensive wound care.  Patient patient reports that she will likely eventually require wound VAC.     Activities of Daily Living:  Patient reports morning stiffness for several hours.   Patient Reports nocturnal pain.  Difficulty dressing/grooming: Denies Difficulty climbing stairs: Reports Difficulty getting out of chair: Reports Difficulty using hands for taps, buttons, cutlery, and/or writing: Reports  Review of Systems  Constitutional:  Positive for fatigue.  HENT:  Negative for mouth sores and mouth dryness.   Eyes:  Positive for dryness.  Respiratory:  Positive for shortness of breath.   Cardiovascular:  Negative for chest pain and palpitations.  Gastrointestinal:  Negative for blood in stool, constipation and  diarrhea.  Endocrine: Negative for increased urination.  Genitourinary:  Negative for involuntary urination.  Musculoskeletal:  Positive for joint pain, gait problem, joint pain, joint swelling, myalgias, muscle weakness, morning stiffness, muscle tenderness and myalgias.  Skin:  Negative for color change, rash, hair loss and sensitivity to sunlight.  Allergic/Immunologic: Negative for susceptible to infections.  Neurological:  Positive for headaches. Negative for dizziness.  Hematological:  Negative for swollen glands.  Psychiatric/Behavioral:  Positive for sleep disturbance. Negative for depressed mood. The patient is not nervous/anxious.     PMFS History:  Patient Active Problem List   Diagnosis Date Noted   Goals of care, counseling/discussion 08/13/2019   Port-A-Cath in place 08/14/2018   Port catheter in place 01/26/2016   Obesity, morbid, BMI 50 or higher ([place]) 09/22/2015   Malignant neoplasm of both breasts in female, estrogen receptor negative ([place]) 09/22/2015   Coagulopathy ([place]) 09/22/2015   Peripheral neuropathy 09/22/2015   Hot flashes 03/24/2015   Cellulitis of chest wall 06/16/2014   Right shoulder pain 06/12/2013   Neck pain 06/12/2013   Gram-negative bacteremia ([place]) 03/21/2013   Anemia of chronic disease 03/21/2013   Breast cancer metastasized to multiple sites ([place]) 02/26/2013   CHF (congestive heart failure) ([place]) 09/11/2012   SVC syndrome 09/05/2012   Neuropathy 09/05/2012   Hypertension 09/05/2012   Anxiety 09/05/2012    Past Medical History:  Diagnosis Date   Anemia  pt pt report   Anxiety    Breast cancer ([place])    mets to liver and lung   Breast cancer metastasized to multiple sites [facility]) 02/26/2013   Cellulitis    CHF (congestive heart failure) ([place])    History of chemotherapy 09/2004   taxotere/herceptin/carboplatin   Hypertension    Neuropathy    Radiation 07/31/2006   left upper chest   Radiation 06/17/2006-06/27/2006   6480 cGy bilat.  chest wall   SVC syndrome    Thrombosis     Family History  Problem Relation Age of Onset   Heart failure Father    Cancer Father        Prostate cancer   Heart failure Brother    Cancer Brother        Prostate cancer   Diabetes Maternal Aunt    Past Surgical History:  Procedure Laterality Date   ANKLE SURGERY Left    BACK SURGERY     CHOLECYSTECTOMY  08/28/1987   MASS EXCISION Left 05/10/2022   Procedure: EXCISION OF LEFT CHEST WALL MASS;  Surgeon: Erroll Luna, MD;  Location: [place];  Service: General;  Laterality: Left;   MASTECTOMY Bilateral    w/ lymph node removal per patient   [place] ([place] HX)     SURGICAL EXCISION OF EXCESSIVE SKIN     TUBAL LIGATION  08/27/1984   Social History   Social History Narrative   Not on file   Immunization History  Administered Date(s) Administered   Fluad Quad(high Dose 65+) 05/21/2019, 05/18/2020, 06/22/2021   Influenza, High Dose Seasonal PF 06/19/2017   Influenza,inj,Quad PF,6+ Mos 06/20/2016, 07/16/2018   Moderna Covid-19 Vaccine Bivalent Booster 68yr & up 10/22/2019, 11/23/2019, 04/29/2020   Moderna SARS-COV2 Booster Vaccination 02/04/2021   Pneumococcal Conjugate-13 02/06/2017     Objective: Vital Signs: BP (!) 144/85 (BP Location: Left Wrist, Patient Position: Sitting, Cuff Size: Normal)   Pulse 65   Resp 18   Ht 5' 3.5" (1.613 m)   Wt 249 lb (112.9 kg)   BMI 43.42 kg/m    Physical Exam Vitals and nursing note reviewed.  Constitutional:      Appearance: She is well-developed.  HENT:     Head: Normocephalic and atraumatic.  Eyes:     Conjunctiva/sclera: Conjunctivae normal.  Cardiovascular:     Rate and Rhythm: Normal rate and regular rhythm.     Heart sounds: Normal heart sounds.  Pulmonary:     Effort: Pulmonary effort is normal.     Breath sounds: Normal breath sounds.  Abdominal:     General: Bowel sounds are  normal.     Palpations: Abdomen is soft.  Musculoskeletal:     Cervical back: Normal range of motion.  Skin:    General: Skin is warm and dry.     Capillary Refill: Capillary refill takes less than 2 seconds.  Neurological:     Mental Status: She is alert and oriented to person, place, and time.  Psychiatric:        Behavior: Behavior normal.      Musculoskeletal Exam: Patient remained seated during the examination today.  C-spine has limited range of motion.  Shoulder joints, elbow joints, wrist joints, MCPs, PIPs, DIPs have good range of motion with no synovitis.  Complete fist formation bilaterally.  PIP and DIP thickening consistent with osteoarthritic changes.  Hip joints difficult to assess in  seated position.  Painful extension of both knee joints with warmth bilaterally.  Ankle joints have good range of motion with no tenderness or synovitis.  CDAI Exam: CDAI Score: -- Patient Global: --; Provider Global: -- Swollen: --; Tender: -- Joint Exam 07/04/2022   No joint exam has been documented for this visit   There is currently no information documented on the homunculus. Go to the Rheumatology activity and complete the homunculus joint exam.  Investigation: No additional findings.  Imaging: No results found.  Recent Labs: Lab Results  Component Value Date   WBC 7.8 06/28/2022   HGB 10.8 (L) 06/28/2022   PLT 268 06/28/2022   NA 139 06/28/2022   K 3.6 06/28/2022   CL 105 06/28/2022   CO2 27 06/28/2022   GLUCOSE 108 (H) 06/28/2022   BUN 14 06/28/2022   CREATININE 1.21 (H) 06/28/2022   BILITOT 0.5 06/28/2022   ALKPHOS 72 06/28/2022   AST 13 (L) 06/28/2022   ALT 7 06/28/2022   PROT 7.5 06/28/2022   ALBUMIN 3.5 06/28/2022   CALCIUM 9.1 06/28/2022   GFRAA >60 05/18/2020    Speciality Comments: No specialty comments available.  Procedures:  No procedures performed Allergies: Penicillins and Adhesive [tape]   Assessment / Plan:     Visit Diagnoses: Primary  osteoarthritis of both hands: X-rays of both hands were consistent with osteoarthritis on 01/03/2022.  She had no erosive changes.  She has no clinical features of rheumatoid arthritis at this time.  She has been experiencing chronic pain and stiffness in both hands.  No tenderness or synovitis over MCP joints noted.  Complete fist formation bilaterally.  She was advised to notify us if she develops increased joint pain or joint swelling at which time we can schedule an ultrasound of both hands to assess for synovitis.  Primary osteoarthritis of both knees - History of pain in bilateral knee joints for several years.  History of cortisone injections in the past.  Using a rollator walker to assist with ambulation.  Worked with a home physical therapist twice a week but did not notice any improvement in her muscle strength, mobility, or discomfort.  She is planning on proceeding with knee replacements in the future when she has been cleared by her surgeon.  Chronic pain of both shoulders she has good range of motion of both shoulder joints on examination today.  She experiences intermittent discomfort and stiffness.  DDD (degenerative disc disease), cervical - X-rays were reviewed which showed multilevel spondylosis and facet joint arthropathy.  Limited range of motion with lateral rotation.  Positive anti-CCP test - 09/19/2021 anti-CCP 38.  No clinical features of rheumatoid arthritis at this time.  She was advised to notify us if she develops increased joint pain or joint swelling.  In the future we can schedule an ultrasound of both hands to assess for synovitis.  Elevated sed rate: No inflammation at this time.   Positive ANA (antinuclear antibody) - ANA low titer positive, double-stranded DNA positive (RNP, SSA, SSB, Smith negative).  She had no clinical features of systemic lupus.   Other medical conditions are listed as follows:  Coagulopathy ([place]) - She is on Coumadin.  Anticardiolipin antibodies  and beta-2 GP 1 antibodies were negative.  SVC syndrome  Chronic diastolic congestive heart failure ([place])  Hypertension due to endocrine disorder  Anemia of chronic disease  Carcinoma of right breast metastatic to multiple sites [facility]) - 2004,Stage 4, bilateral mastectomy, s/p CTX, RTX  Port-A-Cath in place  Gram-negative bacteremia ([place])  History of anxiety  Neuropathy  Orders: No orders of the defined types were placed in this encounter.  No orders of the defined types were placed in this encounter.    Follow-Up Instructions: Return in about 6 months (around 01/02/2023) for Osteoarthritis, DDD.   Ofilia Neas, PA-C  Note - This record has been created using Dragon software.  Chart creation errors have been sought, but may not always  have been located. Such creation errors do not reflect on  the standard of medical care.

## 2022-06-24 NOTE — Progress Notes (Signed)
 Patient Care Team: Prochnau, Caroline, MD as PCP - General (Internal Medicine) Shute, Kevin B., MD as Consulting Physician (Pain Medicine) Yaste, Jeffrey, MD as Referring Physician (Orthopedic Surgery) Hubler, Kyle, DO as Consulting Physician (Orthopedic Surgery) McLean, Dalton S, MD as Consulting Physician (Cardiology) Bensimhon, Daniel R, MD as Consulting Physician (Cardiology) Bertrand, Margaret Lins, MD (Radiology) Van Dam, Cornelius N, MD as Consulting Physician (Infectious Diseases) Kinard, James, MD as Consulting Physician (Radiation Oncology) Gudena, Vinay, MD as Consulting Physician (Hematology and Oncology)  DIAGNOSIS: No diagnosis found.  SUMMARY OF ONCOLOGIC HISTORY: Oncology History  Breast cancer metastasized to multiple sites (HCC)  02/26/2013 Initial Diagnosis   history of inflammatory right breast cancer metastatic at presentation September 2004 with involvement of liver and bone, HER-2 positive, estrogen and progesterone receptor negative      - 11/2013 Chemotherapy   carboplatin, docetaxel and Herceptin x 6 completed April 2005     Surgery   bilateral mastectomies with bilateral axillary lymph node dissection 12/07/2004, showing             (a) on the right, a mypT1c ypN1 invasive ductal carcinoma, grade 3, estrogen and progesterone receptor negative, HER-2 positive, with an MIB-1 of 31%             (b) on the left, ypT2 ypN1 invasive ductal carcinoma, grade 2, estrogen and progesterone receptor negative, HER-2 positive, with an MIB-1 of 35%.   01/2015 - 02/2015 Radiation Therapy   Adj XRT    - 03/2018 Chemotherapy   Ixampra     CHIEF COMPLIANT: Follow-up of  estrogen receptor negative, but HER2 amplified breast cancer on coumadin and  herceptin maintenance  INTERVAL HISTORY: [name] is a  71 y.o. with the above-mentioned. ESTROGEN RECEPTOR POSITIVE She presents to the clinic for a follow-up.   ALLERGIES:  is allergic to penicillins and adhesive  [tape].  MEDICATIONS:  Current Outpatient Medications  Medication Sig Dispense Refill   acetaminophen (TYLENOL) 500 MG tablet Take 1,000 mg by mouth every 6 (six) hours as needed for mild pain or fever.      albuterol (PROVENTIL HFA;VENTOLIN HFA) 108 (90 BASE) MCG/ACT inhaler Inhale 2 puffs into the lungs every 6 (six) hours as needed for wheezing. 1 Inhaler 5   ALPRAZolam (XANAX) 1 MG tablet Take 1 tablet (1 mg total) by mouth 3 (three) times daily as needed for anxiety. 60 tablet 3   amLODipine (NORVASC) 10 MG tablet Take 1 tablet (10 mg total) by mouth every morning. 30 tablet 4   apixaban (ELIQUIS) 5 MG TABS tablet Take 1 tablet (5 mg total) by mouth 2 (two) times daily. 60 tablet 2   baclofen (LIORESAL) 10 MG tablet TAKE 1 TABLET BY MOUTH THREE TIMES DAILY AS NEEDED FOR MUSCLE SPASMS 270 tablet 0   carvedilol (COREG) 12.5 MG tablet TAKE 1 TABLET(12.5 MG) BY MOUTH TWICE DAILY 60 tablet 11   diclofenac sodium (VOLTAREN) 1 % GEL Apply 2 g topically daily as needed (for pain). Apply to knees and shoulders 100 g 6   DULoxetine (CYMBALTA) 20 MG capsule Take 20 mg by mouth daily.     enoxaparin (LOVENOX) 120 MG/0.8ML injection Inject 0.8 mLs (120 mg total) into the skin every 12 (twelve) hours. Start 5 days before surgery. 8 mL 0   furosemide (LASIX) 40 MG tablet Take 40 mg by mouth as needed.     gabapentin (NEURONTIN) 300 MG capsule TAKE 2 CAPSULES BY MOUTH THREE TIMES DAILY 540 capsule 0     losartan (COZAAR) 100 MG tablet Take 100 mg by mouth every morning.     oxyCODONE-acetaminophen (PERCOCET) 10-325 MG tablet Take 1 tablet by mouth 4 (four) times daily as needed.     potassium chloride SA (KLOR-CON) 20 MEQ tablet Take 1 tablet (20 mEq total) by mouth daily as needed. With use of lasix 30 tablet 3   predniSONE (DELTASONE) 5 MG tablet Take 5 mg by mouth daily in the afternoon.     spironolactone (ALDACTONE) 25 MG tablet TAKE 1 TABLET(25 MG) BY MOUTH DAILY 30 tablet 11   temazepam (RESTORIL) 30  MG capsule Take 1 capsule (30 mg total) by mouth at bedtime. 30 capsule 3   traMADol (ULTRAM) 50 MG tablet Take 1 tablet (50 mg total) by mouth every 6 (six) hours as needed. 20 tablet 0   warfarin (COUMADIN) 2.5 MG tablet TAKE 1 TABLET(2.5 MG) BY MOUTH DAILY 30 tablet 3   warfarin (COUMADIN) 5 MG tablet Take 1 tablet (5 mg total) by mouth daily. 30 tablet 3   No current facility-administered medications for this visit.   Facility-Administered Medications Ordered in Other Visits  Medication Dose Route Frequency Provider Last Rate Last Admin   sodium chloride flush (NS) 0.9 % injection 10 mL  10 mL Intravenous PRN Magrinat, Gustav C, MD   10 mL at 12/15/15 1200   sodium chloride flush (NS) 0.9 % injection 10 mL  10 mL Intracatheter PRN Magrinat, Gustav C, MD   10 mL at 08/14/18 1116   sodium chloride flush (NS) 0.9 % injection 10 mL  10 mL Intracatheter PRN Magrinat, Gustav C, MD        PHYSICAL EXAMINATION: ECOG PERFORMANCE STATUS: {CHL ONC ECOG [id number]}  There were no vitals filed for this visit. There were no vitals filed for this visit.  BREAST:*** No palpable masses or nodules in either right or left breasts. No palpable axillary supraclavicular or infraclavicular adenopathy no breast tenderness or nipple discharge. (exam performed in the presence of a chaperone)  LABORATORY DATA:  I have reviewed the data as listed    Latest Ref Rng & Units 05/31/2022   11:38 AM 05/03/2022    8:40 AM 04/05/2022   10:37 AM  CMP  Glucose 70 - 99 mg/dL 112  118  102   BUN 8 - 23 mg/dL 30  17  19   Creatinine 0.44 - 1.00 mg/dL 1.45  0.95  0.85   Sodium 135 - 145 mmol/L 134  140  140   Potassium 3.5 - 5.1 mmol/L 4.5  4.2  4.2   Chloride 98 - 111 mmol/L 99  106  105   CO2 22 - 32 mmol/L 22  27  28   Calcium 8.9 - 10.3 mg/dL 9.2  9.3  9.3   Total Protein 6.5 - 8.1 g/dL 8.0  7.6  8.1   Total Bilirubin 0.3 - 1.2 mg/dL 1.1  0.7  0.9   Alkaline Phos 38 - 126 U/L 81  74  77   AST 15 - 41 U/L 12   13  12   ALT 0 - 44 U/L 10  9  9     Lab Results  Component Value Date   WBC 9.6 05/31/2022   HGB 10.6 (L) 05/31/2022   HCT 30.1 (L) 05/31/2022   MCV 77.2 (L) 05/31/2022   PLT 331 05/31/2022   NEUTROABS 8.2 (H) 05/31/2022    ASSESSMENT & PLAN:  No problem-specific Assessment & Plan notes found for   this encounter.    No orders of the defined types were placed in this encounter.  The patient has a good understanding of the overall plan. she agrees with it. she will call with any problems that may develop before the next visit here. Total time spent: 30 mins including face to face time and time spent for planning, charting and co-ordination of care   [name], CMA 06/24/22    I Yolanda, Davis am scribing for Dr. Gudena  ***  

## 2022-06-25 ENCOUNTER — Encounter: Payer: Self-pay | Admitting: *Deleted

## 2022-06-25 DIAGNOSIS — K219 Gastro-esophageal reflux disease without esophagitis: Secondary | ICD-10-CM | POA: Diagnosis not present

## 2022-06-25 DIAGNOSIS — C50911 Malignant neoplasm of unspecified site of right female breast: Secondary | ICD-10-CM | POA: Diagnosis not present

## 2022-06-25 DIAGNOSIS — C50912 Malignant neoplasm of unspecified site of left female breast: Secondary | ICD-10-CM | POA: Diagnosis not present

## 2022-06-25 DIAGNOSIS — C787 Secondary malignant neoplasm of liver and intrahepatic bile duct: Secondary | ICD-10-CM | POA: Diagnosis not present

## 2022-06-25 DIAGNOSIS — I10 Essential (primary) hypertension: Secondary | ICD-10-CM | POA: Diagnosis not present

## 2022-06-25 DIAGNOSIS — C7951 Secondary malignant neoplasm of bone: Secondary | ICD-10-CM | POA: Diagnosis not present

## 2022-06-25 NOTE — Progress Notes (Signed)
Received call from [facility] stating pt INR 3.5 today.  Per MD pt to resume Warfarin tomorrow 06/26/22 at 2.5 mg dose daily.  INR will be re checked this Thursday when pt comes into the office.  Starla RN states she will educate pt on plan.

## 2022-06-26 ENCOUNTER — Telehealth: Payer: Self-pay | Admitting: *Deleted

## 2022-06-26 DIAGNOSIS — C50919 Malignant neoplasm of unspecified site of unspecified female breast: Secondary | ICD-10-CM

## 2022-06-26 MED ORDER — GABAPENTIN 300 MG PO CAPS: ORAL_CAPSULE | ORAL | 1 refills | Status: DC

## 2022-06-26 NOTE — Telephone Encounter (Signed)
Received call from pt requesting refill for Gabapentin.  Refill sent to pharmacy on file.

## 2022-06-27 ENCOUNTER — Telehealth: Payer: Self-pay

## 2022-06-27 ENCOUNTER — Other Ambulatory Visit: Payer: Self-pay | Admitting: *Deleted

## 2022-06-27 DIAGNOSIS — C7951 Secondary malignant neoplasm of bone: Secondary | ICD-10-CM | POA: Diagnosis not present

## 2022-06-27 DIAGNOSIS — K219 Gastro-esophageal reflux disease without esophagitis: Secondary | ICD-10-CM | POA: Diagnosis not present

## 2022-06-27 DIAGNOSIS — C50919 Malignant neoplasm of unspecified site of unspecified female breast: Secondary | ICD-10-CM

## 2022-06-27 DIAGNOSIS — I10 Essential (primary) hypertension: Secondary | ICD-10-CM | POA: Diagnosis not present

## 2022-06-27 DIAGNOSIS — C787 Secondary malignant neoplasm of liver and intrahepatic bile duct: Secondary | ICD-10-CM | POA: Diagnosis not present

## 2022-06-27 DIAGNOSIS — C50911 Malignant neoplasm of unspecified site of right female breast: Secondary | ICD-10-CM | POA: Diagnosis not present

## 2022-06-27 DIAGNOSIS — C50912 Malignant neoplasm of unspecified site of left female breast: Secondary | ICD-10-CM | POA: Diagnosis not present

## 2022-06-27 NOTE — Patient Outreach (Signed)
  Care Coordination   Initial Visit Note   06/27/2022 Name: CHASEY DULL MRN: [id number] DOB: 1951-05-28  Molli Knock Bobrowski is a 71 y.o. year old female who sees Prochnau, Chrys Racer, MD for primary care. I spoke with  Doristine Devoid by phone today.  What matters to the patients health and wellness today?  Placed call to patient to offer [facility] care coordination program.  Patient declines.    SDOH assessments and interventions completed:  No     Care Coordination Interventions Activated:  No  Care Coordination Interventions:  No, not indicated   Follow up plan: No further intervention required.   Encounter Outcome:  Pt. Refused   Tomasa Rand, RN, BSN, CEN [facility] [employer] [phone]

## 2022-06-28 ENCOUNTER — Inpatient Hospital Stay (HOSPITAL_BASED_OUTPATIENT_CLINIC_OR_DEPARTMENT_OTHER): Payer: Medicare Other | Admitting: Hematology and Oncology

## 2022-06-28 ENCOUNTER — Inpatient Hospital Stay: Payer: Medicare Other

## 2022-06-28 ENCOUNTER — Other Ambulatory Visit: Payer: Self-pay

## 2022-06-28 ENCOUNTER — Inpatient Hospital Stay: Payer: Medicare Other | Attending: Oncology

## 2022-06-28 ENCOUNTER — Other Ambulatory Visit: Payer: Self-pay | Admitting: Hematology and Oncology

## 2022-06-28 ENCOUNTER — Encounter: Payer: Self-pay | Admitting: *Deleted

## 2022-06-28 VITALS — BP 147/78 | HR 81 | Temp 97.5°F | Resp 18 | Ht 63.0 in | Wt 244.7 lb

## 2022-06-28 DIAGNOSIS — K219 Gastro-esophageal reflux disease without esophagitis: Secondary | ICD-10-CM | POA: Diagnosis not present

## 2022-06-28 DIAGNOSIS — C787 Secondary malignant neoplasm of liver and intrahepatic bile duct: Secondary | ICD-10-CM | POA: Diagnosis not present

## 2022-06-28 DIAGNOSIS — C50912 Malignant neoplasm of unspecified site of left female breast: Secondary | ICD-10-CM | POA: Diagnosis not present

## 2022-06-28 DIAGNOSIS — I10 Essential (primary) hypertension: Secondary | ICD-10-CM | POA: Diagnosis not present

## 2022-06-28 DIAGNOSIS — Z7952 Long term (current) use of systemic steroids: Secondary | ICD-10-CM | POA: Diagnosis not present

## 2022-06-28 DIAGNOSIS — C50911 Malignant neoplasm of unspecified site of right female breast: Secondary | ICD-10-CM | POA: Diagnosis not present

## 2022-06-28 DIAGNOSIS — I871 Compression of vein: Secondary | ICD-10-CM

## 2022-06-28 DIAGNOSIS — C50812 Malignant neoplasm of overlapping sites of left female breast: Secondary | ICD-10-CM | POA: Insufficient documentation

## 2022-06-28 DIAGNOSIS — C50011 Malignant neoplasm of nipple and areola, right female breast: Secondary | ICD-10-CM

## 2022-06-28 DIAGNOSIS — C7800 Secondary malignant neoplasm of unspecified lung: Secondary | ICD-10-CM | POA: Insufficient documentation

## 2022-06-28 DIAGNOSIS — Z5112 Encounter for antineoplastic immunotherapy: Secondary | ICD-10-CM | POA: Insufficient documentation

## 2022-06-28 DIAGNOSIS — Z9181 History of falling: Secondary | ICD-10-CM | POA: Diagnosis not present

## 2022-06-28 DIAGNOSIS — C50919 Malignant neoplasm of unspecified site of unspecified female breast: Secondary | ICD-10-CM

## 2022-06-28 DIAGNOSIS — Z5181 Encounter for therapeutic drug level monitoring: Secondary | ICD-10-CM | POA: Diagnosis not present

## 2022-06-28 DIAGNOSIS — Z7901 Long term (current) use of anticoagulants: Secondary | ICD-10-CM | POA: Insufficient documentation

## 2022-06-28 DIAGNOSIS — Z95828 Presence of other vascular implants and grafts: Secondary | ICD-10-CM

## 2022-06-28 DIAGNOSIS — Z7951 Long term (current) use of inhaled steroids: Secondary | ICD-10-CM | POA: Diagnosis not present

## 2022-06-28 DIAGNOSIS — Z9013 Acquired absence of bilateral breasts and nipples: Secondary | ICD-10-CM | POA: Diagnosis not present

## 2022-06-28 DIAGNOSIS — Z79891 Long term (current) use of opiate analgesic: Secondary | ICD-10-CM | POA: Diagnosis not present

## 2022-06-28 DIAGNOSIS — C7951 Secondary malignant neoplasm of bone: Secondary | ICD-10-CM | POA: Insufficient documentation

## 2022-06-28 DIAGNOSIS — M1990 Unspecified osteoarthritis, unspecified site: Secondary | ICD-10-CM | POA: Diagnosis not present

## 2022-06-28 LAB — CMP (CANCER CENTER ONLY)
ALT: 7 U/L (ref 0–44)
AST: 13 U/L — ABNORMAL LOW (ref 15–41)
Albumin: 3.5 g/dL (ref 3.5–5.0)
Alkaline Phosphatase: 72 U/L (ref 38–126)
Anion gap: 7 (ref 5–15)
BUN: 14 mg/dL (ref 8–23)
CO2: 27 mmol/L (ref 22–32)
Calcium: 9.1 mg/dL (ref 8.9–10.3)
Chloride: 105 mmol/L (ref 98–111)
Creatinine: 1.21 mg/dL — ABNORMAL HIGH (ref 0.44–1.00)
GFR, Estimated: 48 mL/min — ABNORMAL LOW (ref >60)
Glucose, Bld: 108 mg/dL — ABNORMAL HIGH (ref 70–99)
Potassium: 3.6 mmol/L (ref 3.5–5.1)
Sodium: 139 mmol/L (ref 135–145)
Total Bilirubin: 0.5 mg/dL (ref 0.3–1.2)
Total Protein: 7.5 g/dL (ref 6.5–8.1)

## 2022-06-28 LAB — CBC WITH DIFFERENTIAL (CANCER CENTER ONLY)
Abs Immature Granulocytes: 0.01 10*3/uL (ref 0.00–0.07)
Basophils Absolute: 0.1 10*3/uL (ref 0.0–0.1)
Basophils Relative: 1 %
Eosinophils Absolute: 0.4 10*3/uL (ref 0.0–0.5)
Eosinophils Relative: 5 %
HCT: 31.3 % — ABNORMAL LOW (ref 36.0–46.0)
Hemoglobin: 10.8 g/dL — ABNORMAL LOW (ref 12.0–15.0)
Immature Granulocytes: 0 %
Lymphocytes Relative: 36 %
Lymphs Abs: 2.8 10*3/uL (ref 0.7–4.0)
MCH: 26.6 pg (ref 26.0–34.0)
MCHC: 34.5 g/dL (ref 30.0–36.0)
MCV: 77.1 fL — ABNORMAL LOW (ref 80.0–100.0)
Monocytes Absolute: 0.8 10*3/uL (ref 0.1–1.0)
Monocytes Relative: 11 %
Neutro Abs: 3.7 10*3/uL (ref 1.7–7.7)
Neutrophils Relative %: 47 %
Platelet Count: 268 10*3/uL (ref 150–400)
RBC: 4.06 MIL/uL (ref 3.87–5.11)
RDW: 15.5 % (ref 11.5–15.5)
WBC Count: 7.8 10*3/uL (ref 4.0–10.5)
nRBC: 0 % (ref 0.0–0.2)

## 2022-06-28 LAB — PROTIME-INR
INR: 2.6 — ABNORMAL HIGH (ref 0.8–1.2)
Prothrombin Time: 27.9 seconds — ABNORMAL HIGH (ref 11.4–15.2)

## 2022-06-28 MED ORDER — SODIUM CHLORIDE 0.9 % IV SOLN: Freq: Once | INTRAVENOUS | Status: AC

## 2022-06-28 MED ORDER — TRASTUZUMAB-DKST CHEMO 150 MG IV SOLR
6.0000 mg/kg | Freq: Once | INTRAVENOUS | Status: AC
  Administered 2022-06-28: 672 mg via INTRAVENOUS
  Filled 2022-06-28: qty 32

## 2022-06-28 MED ORDER — SODIUM CHLORIDE 0.9% FLUSH
10.0000 mL | INTRAVENOUS | Status: DC | PRN
  Administered 2022-06-28: 10 mL

## 2022-06-28 MED ORDER — LORAZEPAM 2 MG/ML IJ SOLN
1.0000 mg | Freq: Once | INTRAMUSCULAR | Status: AC | PRN
  Administered 2022-06-28: 1 mg via INTRAVENOUS
  Filled 2022-06-28: qty 1

## 2022-06-28 MED ORDER — DIPHENHYDRAMINE HCL 25 MG PO CAPS
25.0000 mg | ORAL_CAPSULE | Freq: Once | ORAL | Status: AC
  Administered 2022-06-28: 25 mg via ORAL
  Filled 2022-06-28: qty 1

## 2022-06-28 MED ORDER — ACETAMINOPHEN 325 MG PO TABS
650.0000 mg | ORAL_TABLET | Freq: Once | ORAL | Status: AC
  Administered 2022-06-28: 650 mg via ORAL
  Filled 2022-06-28: qty 2

## 2022-06-28 MED ORDER — ALBUTEROL SULFATE HFA 108 (90 BASE) MCG/ACT IN AERS: 2.0000 | INHALATION_SPRAY | Freq: Four times a day (QID) | RESPIRATORY_TRACT | 6 refills | Status: AC | PRN

## 2022-06-28 NOTE — Progress Notes (Signed)
Pt INR 2.6.  per MD pt needing to continue Warfarin at 2.5 mg p.o daily x7 days and recheck INR in 1 week.  Pt educated and verbalized understanding.  RN attempt x1 to contact [facility].  LVM for RN to return call to the office for instructions to recheck INR in 1 week.

## 2022-06-28 NOTE — Assessment & Plan Note (Signed)
2004: Liver. Lung and bone mets Neoadj chemo TCH X 6 completed April 2005 foll by herceptin maintenance   bilateral mastectomies with bilateral axillary lymph node dissection 12/07/2004, showing             (a) on the right, a mypT1c ypN1 invasive ductal carcinoma, grade 3, estrogen and progesterone receptor negative, HER-2 positive, with an MIB-1 of 31%             (b) on the left, ypT2 ypN1 invasive ductal carcinoma, grade 2, estrogen and progesterone receptor negative, HER-2 positive, with an MIB-1 of 35%.   Adj XRT ----------------------------------------------------------------------------------------------------------------------------------------  Current treatment: Herceptin maintenance therapy Plan to continue Herceptin indefinitely every 28 days.   CT chest abdomen and pelvis 04/04/2022: New soft tissue nodule suggestive of a postoperative fluid collection measuring 0.9 cm concerning for local recurrence.  No other evidence of metastatic disease. Recommendation: Surgical excision biopsy of the subcutaneous nodules   Surgical excision 05/10/2022: No evidence of malignancy.  Fat necrosis/seroma   SVC syndrome: On lifelong anticoagulation.  Coumadin on hold because of recent surgery    Poor wound healing: Seeing Dr. Brantley Stage Return to clinic for monthly Herceptin infusions

## 2022-06-28 NOTE — Progress Notes (Signed)
Spoke w/ Dr. Lindi Adie and he would like dose trastuzumab based on its calculated dose.  Larene Beach, PharmD

## 2022-06-28 NOTE — Patient Instructions (Signed)
[facility] MEDICAL [facility]COLOGY  Discharge Instructions: Thank you for choosing [facility] to provide your oncology and hematology care.   If you have a lab appointment with the Cancer Center, please go directly to the Cancer Center and check in at the registration area.   Wear comfortable clothing and clothing appropriate for easy access to any Portacath or PICC line.   We strive to give you quality time with your provider. You may need to reschedule your appointment if you arrive late (15 or more minutes).  Arriving late affects you and other patients whose appointments are after yours.  Also, if you miss three or more appointments without notifying the office, you may be dismissed from the clinic at the provider's discretion.      For prescription refill requests, have your pharmacy contact our office and allow 72 hours for refills to be completed.    Today you received the following chemotherapy and/or immunotherapy agents herceptin      To help prevent nausea and vomiting after your treatment, we encourage you to take your nausea medication as directed.  BELOW ARE SYMPTOMS THAT SHOULD BE REPORTED IMMEDIATELY: *FEVER GREATER THAN 100.4 F (38 C) OR HIGHER *CHILLS OR SWEATING *NAUSEA AND VOMITING THAT IS NOT CONTROLLED WITH YOUR NAUSEA MEDICATION *UNUSUAL SHORTNESS OF BREATH *UNUSUAL BRUISING OR BLEEDING *URINARY PROBLEMS (pain or burning when urinating, or frequent urination) *BOWEL PROBLEMS (unusual diarrhea, constipation, pain near the anus) TENDERNESS IN MOUTH AND THROAT WITH OR WITHOUT PRESENCE OF ULCERS (sore throat, sores in mouth, or a toothache) UNUSUAL RASH, SWELLING OR PAIN  UNUSUAL VAGINAL DISCHARGE OR ITCHING   Items with * indicate a potential emergency and should be followed up as soon as possible or go to the Emergency Department if any problems should occur.  Please show the CHEMOTHERAPY ALERT CARD or IMMUNOTHERAPY ALERT CARD at check-in to  the Emergency Department and triage nurse.  Should you have questions after your visit or need to cancel or reschedule your appointment, please contact [facility]COL[facility]  Dept: [phone]  and follow the prompts.  Office hours are 8:00 a.m. to 4:30 p.m. Monday - Friday. Please note that voicemails left after 4:00 p.m. may not be returned until the following business day.  We are closed weekends and major holidays. You have access to a nurse at all times for urgent questions. Please call the main number to the clinic Dept: [phone] and follow the prompts.   For any non-urgent questions, you may also contact your provider using MyChart. We now offer e-Visits for anyone 18 and older to request care online for non-urgent symptoms. For details visit [email or web address].   Also download the MyChart app! Go to the app store, search "MyChart", open the app, select , and log in with your MyChart username and password.  Masks are optional in the cancer centers. If you would like for your care team to wear a mask while they are taking care of you, please let them know. You may have one support person who is at least 70 years old accompany you for your appointments. 

## 2022-06-29 ENCOUNTER — Telehealth: Payer: Self-pay | Admitting: Hematology and Oncology

## 2022-06-29 NOTE — Telephone Encounter (Signed)
Rescheduled appointment per room resource and 11/2 los. Left voicemail.

## 2022-06-30 ENCOUNTER — Other Ambulatory Visit: Payer: Self-pay

## 2022-07-03 ENCOUNTER — Other Ambulatory Visit: Payer: Self-pay

## 2022-07-04 ENCOUNTER — Ambulatory Visit: Payer: Medicare Other | Attending: Physician Assistant | Admitting: Physician Assistant

## 2022-07-04 ENCOUNTER — Encounter: Payer: Self-pay | Admitting: Physician Assistant

## 2022-07-04 VITALS — BP 144/85 | HR 65 | Resp 18 | Ht 63.5 in | Wt 249.0 lb

## 2022-07-04 DIAGNOSIS — M170 Bilateral primary osteoarthritis of knee: Secondary | ICD-10-CM | POA: Insufficient documentation

## 2022-07-04 DIAGNOSIS — Z8659 Personal history of other mental and behavioral disorders: Secondary | ICD-10-CM | POA: Diagnosis not present

## 2022-07-04 DIAGNOSIS — M25512 Pain in left shoulder: Secondary | ICD-10-CM | POA: Insufficient documentation

## 2022-07-04 DIAGNOSIS — M5030 Other cervical disc degeneration, unspecified cervical region: Secondary | ICD-10-CM | POA: Diagnosis not present

## 2022-07-04 DIAGNOSIS — C50911 Malignant neoplasm of unspecified site of right female breast: Secondary | ICD-10-CM | POA: Diagnosis not present

## 2022-07-04 DIAGNOSIS — M25511 Pain in right shoulder: Secondary | ICD-10-CM | POA: Insufficient documentation

## 2022-07-04 DIAGNOSIS — R700 Elevated erythrocyte sedimentation rate: Secondary | ICD-10-CM | POA: Diagnosis not present

## 2022-07-04 DIAGNOSIS — D638 Anemia in other chronic diseases classified elsewhere: Secondary | ICD-10-CM | POA: Insufficient documentation

## 2022-07-04 DIAGNOSIS — M19042 Primary osteoarthritis, left hand: Secondary | ICD-10-CM | POA: Diagnosis not present

## 2022-07-04 DIAGNOSIS — G629 Polyneuropathy, unspecified: Secondary | ICD-10-CM | POA: Diagnosis not present

## 2022-07-04 DIAGNOSIS — R768 Other specified abnormal immunological findings in serum: Secondary | ICD-10-CM | POA: Insufficient documentation

## 2022-07-04 DIAGNOSIS — I871 Compression of vein: Secondary | ICD-10-CM | POA: Insufficient documentation

## 2022-07-04 DIAGNOSIS — G8929 Other chronic pain: Secondary | ICD-10-CM | POA: Insufficient documentation

## 2022-07-04 DIAGNOSIS — Z95828 Presence of other vascular implants and grafts: Secondary | ICD-10-CM | POA: Diagnosis not present

## 2022-07-04 DIAGNOSIS — M19041 Primary osteoarthritis, right hand: Secondary | ICD-10-CM | POA: Diagnosis not present

## 2022-07-04 DIAGNOSIS — D689 Coagulation defect, unspecified: Secondary | ICD-10-CM | POA: Insufficient documentation

## 2022-07-04 DIAGNOSIS — I152 Hypertension secondary to endocrine disorders: Secondary | ICD-10-CM | POA: Insufficient documentation

## 2022-07-04 DIAGNOSIS — R7881 Bacteremia: Secondary | ICD-10-CM | POA: Insufficient documentation

## 2022-07-04 DIAGNOSIS — I5032 Chronic diastolic (congestive) heart failure: Secondary | ICD-10-CM | POA: Insufficient documentation

## 2022-07-05 ENCOUNTER — Other Ambulatory Visit: Payer: Self-pay

## 2022-07-06 DIAGNOSIS — K219 Gastro-esophageal reflux disease without esophagitis: Secondary | ICD-10-CM | POA: Diagnosis not present

## 2022-07-06 DIAGNOSIS — C787 Secondary malignant neoplasm of liver and intrahepatic bile duct: Secondary | ICD-10-CM | POA: Diagnosis not present

## 2022-07-06 DIAGNOSIS — C7951 Secondary malignant neoplasm of bone: Secondary | ICD-10-CM | POA: Diagnosis not present

## 2022-07-06 DIAGNOSIS — C50911 Malignant neoplasm of unspecified site of right female breast: Secondary | ICD-10-CM | POA: Diagnosis not present

## 2022-07-06 DIAGNOSIS — I10 Essential (primary) hypertension: Secondary | ICD-10-CM | POA: Diagnosis not present

## 2022-07-06 DIAGNOSIS — C50912 Malignant neoplasm of unspecified site of left female breast: Secondary | ICD-10-CM | POA: Diagnosis not present

## 2022-07-13 DIAGNOSIS — I10 Essential (primary) hypertension: Secondary | ICD-10-CM | POA: Diagnosis not present

## 2022-07-13 DIAGNOSIS — C50911 Malignant neoplasm of unspecified site of right female breast: Secondary | ICD-10-CM | POA: Diagnosis not present

## 2022-07-13 DIAGNOSIS — C50912 Malignant neoplasm of unspecified site of left female breast: Secondary | ICD-10-CM | POA: Diagnosis not present

## 2022-07-13 DIAGNOSIS — K219 Gastro-esophageal reflux disease without esophagitis: Secondary | ICD-10-CM | POA: Diagnosis not present

## 2022-07-13 DIAGNOSIS — C787 Secondary malignant neoplasm of liver and intrahepatic bile duct: Secondary | ICD-10-CM | POA: Diagnosis not present

## 2022-07-13 DIAGNOSIS — C7951 Secondary malignant neoplasm of bone: Secondary | ICD-10-CM | POA: Diagnosis not present

## 2022-07-20 ENCOUNTER — Ambulatory Visit: Payer: Medicare Other

## 2022-07-20 ENCOUNTER — Other Ambulatory Visit: Payer: Medicare Other

## 2022-07-21 DIAGNOSIS — C787 Secondary malignant neoplasm of liver and intrahepatic bile duct: Secondary | ICD-10-CM | POA: Diagnosis not present

## 2022-07-21 DIAGNOSIS — I10 Essential (primary) hypertension: Secondary | ICD-10-CM | POA: Diagnosis not present

## 2022-07-21 DIAGNOSIS — C7951 Secondary malignant neoplasm of bone: Secondary | ICD-10-CM | POA: Diagnosis not present

## 2022-07-21 DIAGNOSIS — K219 Gastro-esophageal reflux disease without esophagitis: Secondary | ICD-10-CM | POA: Diagnosis not present

## 2022-07-21 DIAGNOSIS — C50911 Malignant neoplasm of unspecified site of right female breast: Secondary | ICD-10-CM | POA: Diagnosis not present

## 2022-07-21 DIAGNOSIS — C50912 Malignant neoplasm of unspecified site of left female breast: Secondary | ICD-10-CM | POA: Diagnosis not present

## 2022-07-26 ENCOUNTER — Inpatient Hospital Stay: Payer: Medicare Other

## 2022-07-26 ENCOUNTER — Other Ambulatory Visit: Payer: Self-pay | Admitting: Hematology and Oncology

## 2022-07-26 ENCOUNTER — Other Ambulatory Visit: Payer: Self-pay | Admitting: *Deleted

## 2022-07-26 ENCOUNTER — Telehealth: Payer: Self-pay | Admitting: *Deleted

## 2022-07-26 MED ORDER — TEMAZEPAM 30 MG PO CAPS: 30.0000 mg | ORAL_CAPSULE | Freq: Every day | ORAL | 3 refills | Status: DC

## 2022-07-26 NOTE — Telephone Encounter (Signed)
Received call from pt requesting to cancel appts for today.  Pt states she has come down with a stomach bug.  RN sent high priority message to scheduling to reschedule appts.

## 2022-07-27 DIAGNOSIS — K219 Gastro-esophageal reflux disease without esophagitis: Secondary | ICD-10-CM | POA: Diagnosis not present

## 2022-07-27 DIAGNOSIS — C7951 Secondary malignant neoplasm of bone: Secondary | ICD-10-CM | POA: Diagnosis not present

## 2022-07-27 DIAGNOSIS — I10 Essential (primary) hypertension: Secondary | ICD-10-CM | POA: Diagnosis not present

## 2022-07-27 DIAGNOSIS — C50911 Malignant neoplasm of unspecified site of right female breast: Secondary | ICD-10-CM | POA: Diagnosis not present

## 2022-07-27 DIAGNOSIS — C787 Secondary malignant neoplasm of liver and intrahepatic bile duct: Secondary | ICD-10-CM | POA: Diagnosis not present

## 2022-07-27 DIAGNOSIS — C50912 Malignant neoplasm of unspecified site of left female breast: Secondary | ICD-10-CM | POA: Diagnosis not present

## 2022-07-28 DIAGNOSIS — C50911 Malignant neoplasm of unspecified site of right female breast: Secondary | ICD-10-CM | POA: Diagnosis not present

## 2022-07-28 DIAGNOSIS — Z9181 History of falling: Secondary | ICD-10-CM | POA: Diagnosis not present

## 2022-07-28 DIAGNOSIS — I871 Compression of vein: Secondary | ICD-10-CM | POA: Diagnosis not present

## 2022-07-28 DIAGNOSIS — C7951 Secondary malignant neoplasm of bone: Secondary | ICD-10-CM | POA: Diagnosis not present

## 2022-07-28 DIAGNOSIS — C50912 Malignant neoplasm of unspecified site of left female breast: Secondary | ICD-10-CM | POA: Diagnosis not present

## 2022-07-28 DIAGNOSIS — I10 Essential (primary) hypertension: Secondary | ICD-10-CM | POA: Diagnosis not present

## 2022-07-28 DIAGNOSIS — Z7952 Long term (current) use of systemic steroids: Secondary | ICD-10-CM | POA: Diagnosis not present

## 2022-07-28 DIAGNOSIS — M1990 Unspecified osteoarthritis, unspecified site: Secondary | ICD-10-CM | POA: Diagnosis not present

## 2022-07-28 DIAGNOSIS — Z7951 Long term (current) use of inhaled steroids: Secondary | ICD-10-CM | POA: Diagnosis not present

## 2022-07-28 DIAGNOSIS — Z4801 Encounter for change or removal of surgical wound dressing: Secondary | ICD-10-CM | POA: Diagnosis not present

## 2022-07-28 DIAGNOSIS — Z7901 Long term (current) use of anticoagulants: Secondary | ICD-10-CM | POA: Diagnosis not present

## 2022-07-28 DIAGNOSIS — C787 Secondary malignant neoplasm of liver and intrahepatic bile duct: Secondary | ICD-10-CM | POA: Diagnosis not present

## 2022-07-28 DIAGNOSIS — Z79891 Long term (current) use of opiate analgesic: Secondary | ICD-10-CM | POA: Diagnosis not present

## 2022-07-28 DIAGNOSIS — K219 Gastro-esophageal reflux disease without esophagitis: Secondary | ICD-10-CM | POA: Diagnosis not present

## 2022-07-28 DIAGNOSIS — Z9013 Acquired absence of bilateral breasts and nipples: Secondary | ICD-10-CM | POA: Diagnosis not present

## 2022-07-30 ENCOUNTER — Other Ambulatory Visit: Payer: Self-pay | Admitting: *Deleted

## 2022-07-30 MED ORDER — TEMAZEPAM 30 MG PO CAPS: 30.0000 mg | ORAL_CAPSULE | Freq: Every day | ORAL | 3 refills | Status: DC

## 2022-07-31 DIAGNOSIS — M25512 Pain in left shoulder: Secondary | ICD-10-CM | POA: Diagnosis not present

## 2022-07-31 DIAGNOSIS — M25511 Pain in right shoulder: Secondary | ICD-10-CM | POA: Diagnosis not present

## 2022-07-31 DIAGNOSIS — M19042 Primary osteoarthritis, left hand: Secondary | ICD-10-CM | POA: Diagnosis not present

## 2022-07-31 DIAGNOSIS — Z6841 Body Mass Index (BMI) 40.0 and over, adult: Secondary | ICD-10-CM | POA: Diagnosis not present

## 2022-07-31 DIAGNOSIS — R600 Localized edema: Secondary | ICD-10-CM | POA: Diagnosis not present

## 2022-07-31 DIAGNOSIS — I10 Essential (primary) hypertension: Secondary | ICD-10-CM | POA: Diagnosis not present

## 2022-07-31 DIAGNOSIS — M170 Bilateral primary osteoarthritis of knee: Secondary | ICD-10-CM | POA: Diagnosis not present

## 2022-07-31 DIAGNOSIS — M19041 Primary osteoarthritis, right hand: Secondary | ICD-10-CM | POA: Diagnosis not present

## 2022-08-02 ENCOUNTER — Inpatient Hospital Stay: Payer: Medicare Other | Attending: Oncology

## 2022-08-02 ENCOUNTER — Other Ambulatory Visit: Payer: Self-pay

## 2022-08-02 ENCOUNTER — Inpatient Hospital Stay: Payer: Medicare Other

## 2022-08-02 VITALS — BP 138/87 | HR 67 | Temp 98.5°F | Resp 16 | Ht 63.0 in | Wt 242.0 lb

## 2022-08-02 DIAGNOSIS — C50919 Malignant neoplasm of unspecified site of unspecified female breast: Secondary | ICD-10-CM

## 2022-08-02 DIAGNOSIS — C50911 Malignant neoplasm of unspecified site of right female breast: Secondary | ICD-10-CM | POA: Diagnosis not present

## 2022-08-02 DIAGNOSIS — Z5112 Encounter for antineoplastic immunotherapy: Secondary | ICD-10-CM | POA: Insufficient documentation

## 2022-08-02 DIAGNOSIS — Z95828 Presence of other vascular implants and grafts: Secondary | ICD-10-CM

## 2022-08-02 DIAGNOSIS — C50812 Malignant neoplasm of overlapping sites of left female breast: Secondary | ICD-10-CM | POA: Diagnosis not present

## 2022-08-02 LAB — CBC WITH DIFFERENTIAL (CANCER CENTER ONLY)
Abs Immature Granulocytes: 0.02 10*3/uL (ref 0.00–0.07)
Basophils Absolute: 0.1 10*3/uL (ref 0.0–0.1)
Basophils Relative: 1 %
Eosinophils Absolute: 0.3 10*3/uL (ref 0.0–0.5)
Eosinophils Relative: 3 %
HCT: 32.3 % — ABNORMAL LOW (ref 36.0–46.0)
Hemoglobin: 11.3 g/dL — ABNORMAL LOW (ref 12.0–15.0)
Immature Granulocytes: 0 %
Lymphocytes Relative: 30 %
Lymphs Abs: 2.6 10*3/uL (ref 0.7–4.0)
MCH: 27.5 pg (ref 26.0–34.0)
MCHC: 35 g/dL (ref 30.0–36.0)
MCV: 78.6 fL — ABNORMAL LOW (ref 80.0–100.0)
Monocytes Absolute: 0.7 10*3/uL (ref 0.1–1.0)
Monocytes Relative: 8 %
Neutro Abs: 5 10*3/uL (ref 1.7–7.7)
Neutrophils Relative %: 58 %
Platelet Count: 282 10*3/uL (ref 150–400)
RBC: 4.11 MIL/uL (ref 3.87–5.11)
RDW: 16.1 % — ABNORMAL HIGH (ref 11.5–15.5)
WBC Count: 8.6 10*3/uL (ref 4.0–10.5)
nRBC: 0 % (ref 0.0–0.2)

## 2022-08-02 LAB — CMP (CANCER CENTER ONLY)
ALT: 7 U/L (ref 0–44)
AST: 14 U/L — ABNORMAL LOW (ref 15–41)
Albumin: 3.4 g/dL — ABNORMAL LOW (ref 3.5–5.0)
Alkaline Phosphatase: 67 U/L (ref 38–126)
Anion gap: 9 (ref 5–15)
BUN: 11 mg/dL (ref 8–23)
CO2: 28 mmol/L (ref 22–32)
Calcium: 10 mg/dL (ref 8.9–10.3)
Chloride: 107 mmol/L (ref 98–111)
Creatinine: 0.65 mg/dL (ref 0.44–1.00)
GFR, Estimated: >60 mL/min (ref >60)
Glucose, Bld: 113 mg/dL — ABNORMAL HIGH (ref 70–99)
Potassium: 3.7 mmol/L (ref 3.5–5.1)
Sodium: 144 mmol/L (ref 135–145)
Total Bilirubin: 0.6 mg/dL (ref 0.3–1.2)
Total Protein: 7.2 g/dL (ref 6.5–8.1)

## 2022-08-02 MED ORDER — DIPHENHYDRAMINE HCL 25 MG PO CAPS
25.0000 mg | ORAL_CAPSULE | Freq: Once | ORAL | Status: AC
  Administered 2022-08-02: 25 mg via ORAL
  Filled 2022-08-02: qty 1

## 2022-08-02 MED ORDER — LORAZEPAM 2 MG/ML IJ SOLN
1.0000 mg | Freq: Once | INTRAMUSCULAR | Status: AC | PRN
  Administered 2022-08-02: 1 mg via INTRAVENOUS
  Filled 2022-08-02: qty 1

## 2022-08-02 MED ORDER — HEPARIN SOD (PORK) LOCK FLUSH 100 UNIT/ML IV SOLN
500.0000 [IU] | Freq: Once | INTRAVENOUS | Status: AC | PRN
  Administered 2022-08-02: 500 [IU]

## 2022-08-02 MED ORDER — ACETAMINOPHEN 325 MG PO TABS
650.0000 mg | ORAL_TABLET | Freq: Once | ORAL | Status: AC
  Administered 2022-08-02: 650 mg via ORAL
  Filled 2022-08-02: qty 2

## 2022-08-02 MED ORDER — SODIUM CHLORIDE 0.9% FLUSH
10.0000 mL | INTRAVENOUS | Status: DC | PRN
  Administered 2022-08-02: 10 mL via INTRAVENOUS

## 2022-08-02 MED ORDER — TRASTUZUMAB-DKST CHEMO 150 MG IV SOLR
750.0000 mg | Freq: Once | INTRAVENOUS | Status: AC
  Administered 2022-08-02: 750 mg via INTRAVENOUS
  Filled 2022-08-02: qty 35.72

## 2022-08-02 MED ORDER — SODIUM CHLORIDE 0.9 % IV SOLN: Freq: Once | INTRAVENOUS | Status: AC

## 2022-08-02 MED ORDER — SODIUM CHLORIDE 0.9% FLUSH
10.0000 mL | INTRAVENOUS | Status: DC | PRN
  Administered 2022-08-02: 10 mL

## 2022-08-02 NOTE — Patient Instructions (Signed)
[facility]  Discharge Instructions: Thank you for choosing [facility] to provide your oncology and hematology care.   If you have a lab appointment with the [facility], please go directly to the [facility] and check in at the registration area.   Wear comfortable clothing and clothing appropriate for easy access to any Portacath or PICC line.   We strive to give you quality time with your provider. You may need to reschedule your appointment if you arrive late (15 or more minutes).  Arriving late affects you and other patients whose appointments are after yours.  Also, if you miss three or more appointments without notifying the office, you may be dismissed from the clinic at the provider's discretion.      For prescription refill requests, have your pharmacy contact our office and allow 72 hours for refills to be completed.    Today you received the following chemotherapy and/or immunotherapy agent: Trastuzumab (Ogivri)     To help prevent nausea and vomiting after your treatment, we encourage you to take your nausea medication as directed.  BELOW ARE SYMPTOMS THAT SHOULD BE REPORTED IMMEDIATELY: *FEVER GREATER THAN 100.4 F (38 C) OR HIGHER *CHILLS OR SWEATING *NAUSEA AND VOMITING THAT IS NOT CONTROLLED WITH YOUR NAUSEA MEDICATION *UNUSUAL SHORTNESS OF BREATH *UNUSUAL BRUISING OR BLEEDING *URINARY PROBLEMS (pain or burning when urinating, or frequent urination) *BOWEL PROBLEMS (unusual diarrhea, constipation, pain near the anus) TENDERNESS IN MOUTH AND THROAT WITH OR WITHOUT PRESENCE OF ULCERS (sore throat, sores in mouth, or a toothache) UNUSUAL RASH, SWELLING OR PAIN  UNUSUAL VAGINAL DISCHARGE OR ITCHING   Items with * indicate a potential emergency and should be followed up as soon as possible or go to the Emergency Department if any problems should occur.  Please show the CHEMOTHERAPY ALERT CARD or IMMUNOTHERAPY ALERT CARD at  check-in to the Emergency Department and triage nurse.  Should you have questions after your visit or need to cancel or reschedule your appointment, please contact [facility]  Dept: [phone]  and follow the prompts.  Office hours are 8:00 a.m. to 4:30 p.m. Monday - Friday. Please note that voicemails left after 4:00 p.m. may not be returned until the following business day.  We are closed weekends and major holidays. You have access to a nurse at all times for urgent questions. Please call the main number to the clinic Dept: [phone] and follow the prompts.   For any non-urgent questions, you may also contact your provider using MyChart. We now offer e-Visits for anyone 51 and older to request care online for non-urgent symptoms. For details visit [email or web address].   Also download the MyChart app! Go to the app store, search "MyChart", open the app, select [facility], and log in with your MyChart username and password.  Masks are optional in the cancer centers. If you would like for your care team to wear a mask while they are taking care of you, please let them know. You may have one support person who is at least 70 years old accompany you for your appointments. Trastuzumab Injection What is this medication? TRASTUZUMAB (tras TOO zoo mab) treats breast cancer and stomach cancer. It works by blocking a protein that causes cancer cells to grow and multiply. This helps to slow or stop the spread of cancer cells. This medicine may be used for other purposes; ask your health care provider or pharmacist if you have questions. COMMON  BRAND NAME(S): Herceptin, Galvin Proffer, Trazimera What should I tell my care team before I take this medication? They need to know if you have any of these conditions: Heart failure Lung disease An unusual or allergic reaction to trastuzumab, other medications, foods, dyes, or preservatives Pregnant or  trying to get pregnant Breast-feeding How should I use this medication? This medication is injected into a vein. It is given by your care team in a hospital or clinic setting. Talk to your care team about the use of this medication in children. It is not approved for use in children. Overdosage: If you think you have taken too much of this medicine contact a poison control center or emergency room at once. NOTE: This medicine is only for you. Do not share this medicine with others. What if I miss a dose? Keep appointments for follow-up doses. It is important not to miss your dose. Call your care team if you are unable to keep an appointment. What may interact with this medication? Certain types of chemotherapy, such as daunorubicin, doxorubicin, epirubicin, idarubicin This list may not describe all possible interactions. Give your health care provider a list of all the medicines, herbs, non-prescription drugs, or dietary supplements you use. Also tell them if you smoke, drink alcohol, or use illegal drugs. Some items may interact with your medicine. What should I watch for while using this medication? Your condition will be monitored carefully while you are receiving this medication. This medication may make you feel generally unwell. This is not uncommon, as chemotherapy affects healthy cells as well as cancer cells. Report any side effects. Continue your course of treatment even though you feel ill unless your care team tells you to stop. This medication may increase your risk of getting an infection. Call your care team for advice if you get a fever, chills, sore throat, or other symptoms of a cold or flu. Do not treat yourself. Try to avoid being around people who are sick. Avoid taking medications that contain aspirin, acetaminophen, ibuprofen, naproxen, or ketoprofen unless instructed by your care team. These medications can hide a fever. Talk to your care team if you may be pregnant. Serious  birth defects can occur if you take this medication during pregnancy and for 7 months after the last dose. You will need a negative pregnancy test before starting this medication. Contraception is recommended while taking this medication and for 7 months after the last dose. Your care team can help you find the option that works for you. Do not breastfeed while taking this medication and for 7 months after stopping treatment. What side effects may I notice from receiving this medication? Side effects that you should report to your care team as soon as possible: Allergic reactions or angioedema--skin rash, itching or hives, swelling of the face, eyes, lips, tongue, arms, or legs, trouble swallowing or breathing Dry cough, shortness of breath or trouble breathing Heart failure--shortness of breath, swelling of the ankles, feet, or hands, sudden weight gain, unusual weakness or fatigue Infection--fever, chills, cough, or sore throat Infusion reactions--chest pain, shortness of breath or trouble breathing, feeling faint or lightheaded Side effects that usually do not require medical attention (report to your care team if they continue or are bothersome): Diarrhea Dizziness Headache Nausea Trouble sleeping Vomiting This list may not describe all possible side effects. Call your doctor for medical advice about side effects. You may report side effects to FDA at 1-800-FDA-1088. Where should I keep my medication?  This medication is given in a hospital or clinic. It will not be stored at home. NOTE: This sheet is a summary. It may not cover all possible information. If you have questions about this medicine, talk to your doctor, pharmacist, or health care provider.  2023 Elsevier/Gold Standard (2021-12-14 00:00:00)

## 2022-08-03 DIAGNOSIS — C787 Secondary malignant neoplasm of liver and intrahepatic bile duct: Secondary | ICD-10-CM | POA: Diagnosis not present

## 2022-08-03 DIAGNOSIS — Z4801 Encounter for change or removal of surgical wound dressing: Secondary | ICD-10-CM | POA: Diagnosis not present

## 2022-08-03 DIAGNOSIS — C50912 Malignant neoplasm of unspecified site of left female breast: Secondary | ICD-10-CM | POA: Diagnosis not present

## 2022-08-03 DIAGNOSIS — I10 Essential (primary) hypertension: Secondary | ICD-10-CM | POA: Diagnosis not present

## 2022-08-03 DIAGNOSIS — C50911 Malignant neoplasm of unspecified site of right female breast: Secondary | ICD-10-CM | POA: Diagnosis not present

## 2022-08-03 DIAGNOSIS — C7951 Secondary malignant neoplasm of bone: Secondary | ICD-10-CM | POA: Diagnosis not present

## 2022-08-09 DIAGNOSIS — Z23 Encounter for immunization: Secondary | ICD-10-CM | POA: Diagnosis not present

## 2022-08-10 DIAGNOSIS — I10 Essential (primary) hypertension: Secondary | ICD-10-CM | POA: Diagnosis not present

## 2022-08-10 DIAGNOSIS — C7951 Secondary malignant neoplasm of bone: Secondary | ICD-10-CM | POA: Diagnosis not present

## 2022-08-10 DIAGNOSIS — C787 Secondary malignant neoplasm of liver and intrahepatic bile duct: Secondary | ICD-10-CM | POA: Diagnosis not present

## 2022-08-10 DIAGNOSIS — Z4801 Encounter for change or removal of surgical wound dressing: Secondary | ICD-10-CM | POA: Diagnosis not present

## 2022-08-10 DIAGNOSIS — C50912 Malignant neoplasm of unspecified site of left female breast: Secondary | ICD-10-CM | POA: Diagnosis not present

## 2022-08-10 DIAGNOSIS — C50911 Malignant neoplasm of unspecified site of right female breast: Secondary | ICD-10-CM | POA: Diagnosis not present

## 2022-08-14 ENCOUNTER — Telehealth: Payer: Self-pay | Admitting: *Deleted

## 2022-08-14 ENCOUNTER — Other Ambulatory Visit: Payer: Self-pay | Admitting: *Deleted

## 2022-08-14 MED ORDER — APIXABAN 5 MG PO TABS: 5.0000 mg | ORAL_TABLET | Freq: Two times a day (BID) | ORAL | 3 refills | Status: DC

## 2022-08-14 NOTE — Telephone Encounter (Signed)
Received message from oral PA team for Eliquis stating they needed a hard script to send with application for pt assistant renewal.  Script printed and provided to PA team.

## 2022-08-16 ENCOUNTER — Other Ambulatory Visit: Payer: Medicare Other

## 2022-08-16 ENCOUNTER — Ambulatory Visit: Payer: Medicare Other

## 2022-08-17 ENCOUNTER — Telehealth: Payer: Self-pay

## 2022-08-17 DIAGNOSIS — C50912 Malignant neoplasm of unspecified site of left female breast: Secondary | ICD-10-CM | POA: Diagnosis not present

## 2022-08-17 DIAGNOSIS — Z4801 Encounter for change or removal of surgical wound dressing: Secondary | ICD-10-CM | POA: Diagnosis not present

## 2022-08-17 DIAGNOSIS — C787 Secondary malignant neoplasm of liver and intrahepatic bile duct: Secondary | ICD-10-CM | POA: Diagnosis not present

## 2022-08-17 DIAGNOSIS — I10 Essential (primary) hypertension: Secondary | ICD-10-CM | POA: Diagnosis not present

## 2022-08-17 DIAGNOSIS — C50911 Malignant neoplasm of unspecified site of right female breast: Secondary | ICD-10-CM | POA: Diagnosis not present

## 2022-08-17 DIAGNOSIS — C7951 Secondary malignant neoplasm of bone: Secondary | ICD-10-CM | POA: Diagnosis not present

## 2022-08-17 NOTE — Telephone Encounter (Signed)
Placed call to [facility] to confirm receipt of renewal application for Eliquis '5mg'$  tablets. Representative confirmed receipt of application and that application is being processed. [employer] will notify Patient by mail and Provider by fax of decision.

## 2022-08-23 ENCOUNTER — Other Ambulatory Visit: Payer: Medicare Other

## 2022-08-23 ENCOUNTER — Ambulatory Visit: Payer: Medicare Other

## 2022-08-27 DIAGNOSIS — C50911 Malignant neoplasm of unspecified site of right female breast: Secondary | ICD-10-CM | POA: Diagnosis not present

## 2022-08-27 DIAGNOSIS — Z7951 Long term (current) use of inhaled steroids: Secondary | ICD-10-CM | POA: Diagnosis not present

## 2022-08-27 DIAGNOSIS — Z7952 Long term (current) use of systemic steroids: Secondary | ICD-10-CM | POA: Diagnosis not present

## 2022-08-27 DIAGNOSIS — C7951 Secondary malignant neoplasm of bone: Secondary | ICD-10-CM | POA: Diagnosis not present

## 2022-08-27 DIAGNOSIS — C787 Secondary malignant neoplasm of liver and intrahepatic bile duct: Secondary | ICD-10-CM | POA: Diagnosis not present

## 2022-08-27 DIAGNOSIS — I10 Essential (primary) hypertension: Secondary | ICD-10-CM | POA: Diagnosis not present

## 2022-08-27 DIAGNOSIS — Z79891 Long term (current) use of opiate analgesic: Secondary | ICD-10-CM | POA: Diagnosis not present

## 2022-08-27 DIAGNOSIS — C50912 Malignant neoplasm of unspecified site of left female breast: Secondary | ICD-10-CM | POA: Diagnosis not present

## 2022-08-27 DIAGNOSIS — K219 Gastro-esophageal reflux disease without esophagitis: Secondary | ICD-10-CM | POA: Diagnosis not present

## 2022-08-27 DIAGNOSIS — M1990 Unspecified osteoarthritis, unspecified site: Secondary | ICD-10-CM | POA: Diagnosis not present

## 2022-08-27 DIAGNOSIS — I871 Compression of vein: Secondary | ICD-10-CM | POA: Diagnosis not present

## 2022-08-27 DIAGNOSIS — Z7901 Long term (current) use of anticoagulants: Secondary | ICD-10-CM | POA: Diagnosis not present

## 2022-08-27 DIAGNOSIS — Z9181 History of falling: Secondary | ICD-10-CM | POA: Diagnosis not present

## 2022-08-27 DIAGNOSIS — Z4801 Encounter for change or removal of surgical wound dressing: Secondary | ICD-10-CM | POA: Diagnosis not present

## 2022-08-27 DIAGNOSIS — Z9013 Acquired absence of bilateral breasts and nipples: Secondary | ICD-10-CM | POA: Diagnosis not present

## 2022-08-28 DIAGNOSIS — Z4889 Encounter for other specified surgical aftercare: Secondary | ICD-10-CM | POA: Diagnosis not present

## 2022-08-30 ENCOUNTER — Inpatient Hospital Stay: Payer: Medicare Other | Attending: Oncology

## 2022-08-30 ENCOUNTER — Other Ambulatory Visit: Payer: Self-pay

## 2022-08-30 ENCOUNTER — Inpatient Hospital Stay: Payer: Medicare Other

## 2022-08-30 ENCOUNTER — Other Ambulatory Visit: Payer: Self-pay | Admitting: *Deleted

## 2022-08-30 VITALS — BP 140/78 | HR 69 | Temp 98.2°F | Resp 18 | Wt 234.5 lb

## 2022-08-30 DIAGNOSIS — C50919 Malignant neoplasm of unspecified site of unspecified female breast: Secondary | ICD-10-CM

## 2022-08-30 DIAGNOSIS — C50011 Malignant neoplasm of nipple and areola, right female breast: Secondary | ICD-10-CM

## 2022-08-30 DIAGNOSIS — Z95828 Presence of other vascular implants and grafts: Secondary | ICD-10-CM

## 2022-08-30 DIAGNOSIS — C50911 Malignant neoplasm of unspecified site of right female breast: Secondary | ICD-10-CM | POA: Diagnosis not present

## 2022-08-30 DIAGNOSIS — Z5112 Encounter for antineoplastic immunotherapy: Secondary | ICD-10-CM | POA: Insufficient documentation

## 2022-08-30 DIAGNOSIS — C50812 Malignant neoplasm of overlapping sites of left female breast: Secondary | ICD-10-CM | POA: Insufficient documentation

## 2022-08-30 LAB — CBC WITH DIFFERENTIAL (CANCER CENTER ONLY)
Abs Immature Granulocytes: 0.01 10*3/uL (ref 0.00–0.07)
Basophils Absolute: 0 10*3/uL (ref 0.0–0.1)
Basophils Relative: 0 %
Eosinophils Absolute: 0.1 10*3/uL (ref 0.0–0.5)
Eosinophils Relative: 2 %
HCT: 33.7 % — ABNORMAL LOW (ref 36.0–46.0)
Hemoglobin: 12 g/dL (ref 12.0–15.0)
Immature Granulocytes: 0 %
Lymphocytes Relative: 31 %
Lymphs Abs: 1.5 10*3/uL (ref 0.7–4.0)
MCH: 27.5 pg (ref 26.0–34.0)
MCHC: 35.6 g/dL (ref 30.0–36.0)
MCV: 77.3 fL — ABNORMAL LOW (ref 80.0–100.0)
Monocytes Absolute: 0.2 10*3/uL (ref 0.1–1.0)
Monocytes Relative: 5 %
Neutro Abs: 2.9 10*3/uL (ref 1.7–7.7)
Neutrophils Relative %: 62 %
Platelet Count: 240 10*3/uL (ref 150–400)
RBC: 4.36 MIL/uL (ref 3.87–5.11)
RDW: 16.4 % — ABNORMAL HIGH (ref 11.5–15.5)
WBC Count: 4.8 10*3/uL (ref 4.0–10.5)
nRBC: 0 % (ref 0.0–0.2)

## 2022-08-30 LAB — CMP (CANCER CENTER ONLY)
ALT: 9 U/L (ref 0–44)
AST: 18 U/L (ref 15–41)
Albumin: 3.7 g/dL (ref 3.5–5.0)
Alkaline Phosphatase: 65 U/L (ref 38–126)
Anion gap: 9 (ref 5–15)
BUN: 10 mg/dL (ref 8–23)
CO2: 25 mmol/L (ref 22–32)
Calcium: 9.2 mg/dL (ref 8.9–10.3)
Chloride: 107 mmol/L (ref 98–111)
Creatinine: 0.91 mg/dL (ref 0.44–1.00)
GFR, Estimated: >60 mL/min (ref >60)
Glucose, Bld: 113 mg/dL — ABNORMAL HIGH (ref 70–99)
Potassium: 3.6 mmol/L (ref 3.5–5.1)
Sodium: 141 mmol/L (ref 135–145)
Total Bilirubin: 0.6 mg/dL (ref 0.3–1.2)
Total Protein: 7.4 g/dL (ref 6.5–8.1)

## 2022-08-30 MED ORDER — DIPHENHYDRAMINE HCL 25 MG PO CAPS
25.0000 mg | ORAL_CAPSULE | Freq: Once | ORAL | Status: AC
  Administered 2022-08-30: 25 mg via ORAL
  Filled 2022-08-30: qty 1

## 2022-08-30 MED ORDER — SODIUM CHLORIDE 0.9% FLUSH
10.0000 mL | INTRAVENOUS | Status: DC | PRN
  Administered 2022-08-30: 10 mL

## 2022-08-30 MED ORDER — SODIUM CHLORIDE 0.9% FLUSH
10.0000 mL | INTRAVENOUS | Status: DC | PRN
  Administered 2022-08-30 (×2): 10 mL

## 2022-08-30 MED ORDER — ACETAMINOPHEN 325 MG PO TABS
650.0000 mg | ORAL_TABLET | Freq: Once | ORAL | Status: AC
  Administered 2022-08-30: 650 mg via ORAL
  Filled 2022-08-30: qty 2

## 2022-08-30 MED ORDER — HEPARIN SOD (PORK) LOCK FLUSH 100 UNIT/ML IV SOLN
500.0000 [IU] | Freq: Once | INTRAVENOUS | Status: AC | PRN
  Administered 2022-08-30: 500 [IU]

## 2022-08-30 MED ORDER — SODIUM CHLORIDE 0.9 % IV SOLN: Freq: Once | INTRAVENOUS | Status: AC

## 2022-08-30 MED ORDER — TRASTUZUMAB-DKST CHEMO 150 MG IV SOLR
750.0000 mg | Freq: Once | INTRAVENOUS | Status: AC
  Administered 2022-08-30: 750 mg via INTRAVENOUS
  Filled 2022-08-30: qty 35.72

## 2022-08-30 MED ORDER — ONDANSETRON HCL 8 MG PO TABS: 8.0000 mg | ORAL_TABLET | Freq: Three times a day (TID) | ORAL | 1 refills | Status: DC | PRN

## 2022-08-30 MED ORDER — LORAZEPAM 2 MG/ML IJ SOLN
1.0000 mg | Freq: Once | INTRAMUSCULAR | Status: AC | PRN
  Administered 2022-08-30: 1 mg via INTRAVENOUS
  Filled 2022-08-30: qty 1

## 2022-08-30 NOTE — Progress Notes (Signed)
Pt in infusion room with complaint of intermittent nausea while at home.  RN reviewed with MD and verbal orders received for pt to be prescribed Zofran 8 mg p.o PRN for nausea.  Prescription sent to pharmacy on file.

## 2022-08-30 NOTE — Patient Instructions (Signed)
[facility]  Discharge Instructions: Thank you for choosing [facility] to provide your oncology and hematology care.   If you have a lab appointment with the [facility], please go directly to the [facility] and check in at the registration area.   Wear comfortable clothing and clothing appropriate for easy access to any Portacath or PICC line.   We strive to give you quality time with your provider. You may need to reschedule your appointment if you arrive late (15 or more minutes).  Arriving late affects you and other patients whose appointments are after yours.  Also, if you miss three or more appointments without notifying the office, you may be dismissed from the clinic at the provider's discretion.      For prescription refill requests, have your pharmacy contact our office and allow 72 hours for refills to be completed.    Today you received the following chemotherapy and/or immunotherapy agents: Trastuzumab-[id number]      To help prevent nausea and vomiting after your treatment, we encourage you to take your nausea medication as directed.  BELOW ARE SYMPTOMS THAT SHOULD BE REPORTED IMMEDIATELY: *FEVER GREATER THAN 100.4 F (38 C) OR HIGHER *CHILLS OR SWEATING *NAUSEA AND VOMITING THAT IS NOT CONTROLLED WITH YOUR NAUSEA MEDICATION *UNUSUAL SHORTNESS OF BREATH *UNUSUAL BRUISING OR BLEEDING *URINARY PROBLEMS (pain or burning when urinating, or frequent urination) *BOWEL PROBLEMS (unusual diarrhea, constipation, pain near the anus) TENDERNESS IN MOUTH AND THROAT WITH OR WITHOUT PRESENCE OF ULCERS (sore throat, sores in mouth, or a toothache) UNUSUAL RASH, SWELLING OR PAIN  UNUSUAL VAGINAL DISCHARGE OR ITCHING   Items with * indicate a potential emergency and should be followed up as soon as possible or go to the Emergency Department if any problems should occur.  Please show the CHEMOTHERAPY ALERT CARD or IMMUNOTHERAPY ALERT CARD at  check-in to the Emergency Department and triage nurse.  Should you have questions after your visit or need to cancel or reschedule your appointment, please contact [facility]  Dept: [phone]  and follow the prompts.  Office hours are 8:00 a.m. to 4:30 p.m. Monday - Friday. Please note that voicemails left after 4:00 p.m. may not be returned until the following business day.  We are closed weekends and major holidays. You have access to a nurse at all times for urgent questions. Please call the main number to the clinic Dept: [phone] and follow the prompts.   For any non-urgent questions, you may also contact your provider using MyChart. We now offer e-Visits for anyone 55 and older to request care online for non-urgent symptoms. For details visit [email or web address].   Also download the MyChart app! Go to the app store, search "MyChart", open the app, select [facility], and log in with your MyChart username and password.

## 2022-09-02 ENCOUNTER — Other Ambulatory Visit: Payer: Self-pay | Admitting: Hematology and Oncology

## 2022-09-06 ENCOUNTER — Other Ambulatory Visit: Payer: Medicare Other

## 2022-09-06 ENCOUNTER — Ambulatory Visit: Payer: Medicare Other

## 2022-09-06 ENCOUNTER — Telehealth: Payer: Self-pay | Admitting: Dietician

## 2022-09-06 NOTE — Telephone Encounter (Signed)
Patient screened on MST. First attempt to reach. Provided my cell# on voice mail and in text to return call to set up a nutrition consult. ? ?Cyndi Crescentia Boutwell, RDN, LDN ?Registered Dietitian, [facility] ?Part Time Remote (Usual office hours: Tuesday-Thursday) ?Cell: [phone]   ?

## 2022-09-13 ENCOUNTER — Ambulatory Visit: Payer: Medicare Other

## 2022-09-13 ENCOUNTER — Other Ambulatory Visit: Payer: Medicare Other

## 2022-09-14 DIAGNOSIS — I10 Essential (primary) hypertension: Secondary | ICD-10-CM | POA: Diagnosis not present

## 2022-09-14 DIAGNOSIS — C7951 Secondary malignant neoplasm of bone: Secondary | ICD-10-CM | POA: Diagnosis not present

## 2022-09-14 DIAGNOSIS — C787 Secondary malignant neoplasm of liver and intrahepatic bile duct: Secondary | ICD-10-CM | POA: Diagnosis not present

## 2022-09-14 DIAGNOSIS — Z4801 Encounter for change or removal of surgical wound dressing: Secondary | ICD-10-CM | POA: Diagnosis not present

## 2022-09-14 DIAGNOSIS — C50912 Malignant neoplasm of unspecified site of left female breast: Secondary | ICD-10-CM | POA: Diagnosis not present

## 2022-09-14 DIAGNOSIS — C50911 Malignant neoplasm of unspecified site of right female breast: Secondary | ICD-10-CM | POA: Diagnosis not present

## 2022-09-20 ENCOUNTER — Other Ambulatory Visit: Payer: Medicare Other

## 2022-09-20 ENCOUNTER — Ambulatory Visit: Payer: Medicare Other | Admitting: Hematology and Oncology

## 2022-09-20 ENCOUNTER — Ambulatory Visit: Payer: Medicare Other

## 2022-09-21 DIAGNOSIS — C787 Secondary malignant neoplasm of liver and intrahepatic bile duct: Secondary | ICD-10-CM | POA: Diagnosis not present

## 2022-09-21 DIAGNOSIS — C7951 Secondary malignant neoplasm of bone: Secondary | ICD-10-CM | POA: Diagnosis not present

## 2022-09-21 DIAGNOSIS — C50912 Malignant neoplasm of unspecified site of left female breast: Secondary | ICD-10-CM | POA: Diagnosis not present

## 2022-09-21 DIAGNOSIS — C50911 Malignant neoplasm of unspecified site of right female breast: Secondary | ICD-10-CM | POA: Diagnosis not present

## 2022-09-21 DIAGNOSIS — Z4801 Encounter for change or removal of surgical wound dressing: Secondary | ICD-10-CM | POA: Diagnosis not present

## 2022-09-21 DIAGNOSIS — I10 Essential (primary) hypertension: Secondary | ICD-10-CM | POA: Diagnosis not present

## 2022-09-25 ENCOUNTER — Other Ambulatory Visit: Payer: Self-pay | Admitting: *Deleted

## 2022-09-25 MED ORDER — APIXABAN 5 MG PO TABS: 5.0000 mg | ORAL_TABLET | Freq: Two times a day (BID) | ORAL | 3 refills | Status: DC

## 2022-09-26 ENCOUNTER — Other Ambulatory Visit (HOSPITAL_COMMUNITY): Payer: Medicare Other

## 2022-09-26 ENCOUNTER — Encounter (HOSPITAL_COMMUNITY)
Admission: RE | Admit: 2022-09-26 | Discharge: 2022-09-26 | Disposition: A | Discharge status: Home / Self Care | Payer: Medicare Other | Source: Ambulatory Visit | Attending: Hematology and Oncology | Admitting: Hematology and Oncology

## 2022-09-26 DIAGNOSIS — M19011 Primary osteoarthritis, right shoulder: Secondary | ICD-10-CM | POA: Diagnosis not present

## 2022-09-26 DIAGNOSIS — C50911 Malignant neoplasm of unspecified site of right female breast: Secondary | ICD-10-CM

## 2022-09-26 DIAGNOSIS — M170 Bilateral primary osteoarthritis of knee: Secondary | ICD-10-CM | POA: Diagnosis not present

## 2022-09-26 DIAGNOSIS — Z853 Personal history of malignant neoplasm of breast: Secondary | ICD-10-CM | POA: Diagnosis not present

## 2022-09-26 DIAGNOSIS — M19012 Primary osteoarthritis, left shoulder: Secondary | ICD-10-CM | POA: Diagnosis not present

## 2022-09-26 MED ORDER — TECHNETIUM TC 99M MEDRONATE IV KIT
19.0000 | PACK | Freq: Once | INTRAVENOUS | Status: AC
  Administered 2022-09-26: 19 via INTRAVENOUS

## 2022-09-26 NOTE — Assessment & Plan Note (Signed)
2004: Liver. Lung and bone mets Neoadj chemo TCH X 6 completed April 2005 foll by herceptin maintenance   bilateral mastectomies with bilateral axillary lymph node dissection 12/07/2004, showing             (a) on the right, a mypT1c ypN1 invasive ductal carcinoma, grade 3, estrogen and progesterone receptor negative, HER-2 positive, with an MIB-1 of 31%             (b) on the left, ypT2 ypN1 invasive ductal carcinoma, grade 2, estrogen and progesterone receptor negative, HER-2 positive, with an MIB-1 of 35%.   Adj XRT ----------------------------------------------------------------------------------------------------------------------------------------  Current treatment: Herceptin maintenance therapy Plan to continue Herceptin indefinitely every 28 days.   CT chest abdomen and pelvis 04/04/2022: New soft tissue nodule suggestive of a postoperative fluid collection measuring 0.9 cm concerning for local recurrence.  No other evidence of metastatic disease. Surgical excision 05/10/2022: No evidence of malignancy.  Fat necrosis/seroma   SVC syndrome: On lifelong anticoagulation.  Currently on Coumadin, then but switching to Eliquis as soon as it becomes available.   Bone scan 09/26/2021: CT CAP: Not scheduled yet.  Return to clinic for monthly Herceptin infusions

## 2022-09-27 ENCOUNTER — Encounter: Payer: Self-pay | Admitting: *Deleted

## 2022-09-27 ENCOUNTER — Inpatient Hospital Stay: Payer: Medicare Other

## 2022-09-27 ENCOUNTER — Inpatient Hospital Stay: Payer: Medicare Other | Attending: Oncology

## 2022-09-27 ENCOUNTER — Other Ambulatory Visit: Payer: Self-pay

## 2022-09-27 ENCOUNTER — Inpatient Hospital Stay (HOSPITAL_BASED_OUTPATIENT_CLINIC_OR_DEPARTMENT_OTHER): Payer: Medicare Other | Admitting: Hematology and Oncology

## 2022-09-27 VITALS — BP 147/81 | HR 73 | Temp 97.8°F | Resp 16 | Wt 232.0 lb

## 2022-09-27 DIAGNOSIS — Z171 Estrogen receptor negative status [ER-]: Secondary | ICD-10-CM | POA: Diagnosis not present

## 2022-09-27 DIAGNOSIS — Z5112 Encounter for antineoplastic immunotherapy: Secondary | ICD-10-CM | POA: Diagnosis not present

## 2022-09-27 DIAGNOSIS — C50911 Malignant neoplasm of unspecified site of right female breast: Secondary | ICD-10-CM

## 2022-09-27 DIAGNOSIS — C50919 Malignant neoplasm of unspecified site of unspecified female breast: Secondary | ICD-10-CM

## 2022-09-27 DIAGNOSIS — C50812 Malignant neoplasm of overlapping sites of left female breast: Secondary | ICD-10-CM | POA: Diagnosis not present

## 2022-09-27 DIAGNOSIS — Z7901 Long term (current) use of anticoagulants: Secondary | ICD-10-CM | POA: Insufficient documentation

## 2022-09-27 LAB — CBC WITH DIFFERENTIAL (CANCER CENTER ONLY)
Abs Immature Granulocytes: 0.01 10*3/uL (ref 0.00–0.07)
Basophils Absolute: 0 10*3/uL (ref 0.0–0.1)
Basophils Relative: 1 %
Eosinophils Absolute: 0.4 10*3/uL (ref 0.0–0.5)
Eosinophils Relative: 5 %
HCT: 32 % — ABNORMAL LOW (ref 36.0–46.0)
Hemoglobin: 11.2 g/dL — ABNORMAL LOW (ref 12.0–15.0)
Immature Granulocytes: 0 %
Lymphocytes Relative: 49 %
Lymphs Abs: 3.7 10*3/uL (ref 0.7–4.0)
MCH: 27.4 pg (ref 26.0–34.0)
MCHC: 35 g/dL (ref 30.0–36.0)
MCV: 78.2 fL — ABNORMAL LOW (ref 80.0–100.0)
Monocytes Absolute: 0.6 10*3/uL (ref 0.1–1.0)
Monocytes Relative: 8 %
Neutro Abs: 2.7 10*3/uL (ref 1.7–7.7)
Neutrophils Relative %: 37 %
Platelet Count: 247 10*3/uL (ref 150–400)
RBC: 4.09 MIL/uL (ref 3.87–5.11)
RDW: 16.1 % — ABNORMAL HIGH (ref 11.5–15.5)
WBC Count: 7.4 10*3/uL (ref 4.0–10.5)
nRBC: 0 % (ref 0.0–0.2)

## 2022-09-27 LAB — CMP (CANCER CENTER ONLY)
ALT: 7 U/L (ref 0–44)
AST: 13 U/L — ABNORMAL LOW (ref 15–41)
Albumin: 3.4 g/dL — ABNORMAL LOW (ref 3.5–5.0)
Alkaline Phosphatase: 72 U/L (ref 38–126)
Anion gap: 5 (ref 5–15)
BUN: 17 mg/dL (ref 8–23)
CO2: 28 mmol/L (ref 22–32)
Calcium: 9.2 mg/dL (ref 8.9–10.3)
Chloride: 107 mmol/L (ref 98–111)
Creatinine: 0.83 mg/dL (ref 0.44–1.00)
GFR, Estimated: >60 mL/min (ref >60)
Glucose, Bld: 88 mg/dL (ref 70–99)
Potassium: 3.6 mmol/L (ref 3.5–5.1)
Sodium: 140 mmol/L (ref 135–145)
Total Bilirubin: 0.4 mg/dL (ref 0.3–1.2)
Total Protein: 7.3 g/dL (ref 6.5–8.1)

## 2022-09-27 MED ORDER — SODIUM CHLORIDE 0.9% FLUSH
10.0000 mL | INTRAVENOUS | Status: DC | PRN
  Administered 2022-09-27: 10 mL

## 2022-09-27 MED ORDER — SODIUM CHLORIDE 0.9 % IV SOLN: Freq: Once | INTRAVENOUS | Status: AC

## 2022-09-27 MED ORDER — ACETAMINOPHEN 325 MG PO TABS
650.0000 mg | ORAL_TABLET | Freq: Once | ORAL | Status: AC
  Administered 2022-09-27: 650 mg via ORAL
  Filled 2022-09-27: qty 2

## 2022-09-27 MED ORDER — DIPHENHYDRAMINE HCL 25 MG PO CAPS
25.0000 mg | ORAL_CAPSULE | Freq: Once | ORAL | Status: AC
  Administered 2022-09-27: 25 mg via ORAL
  Filled 2022-09-27: qty 1

## 2022-09-27 MED ORDER — LORAZEPAM 2 MG/ML IJ SOLN
1.0000 mg | Freq: Once | INTRAMUSCULAR | Status: AC | PRN
  Administered 2022-09-27: 1 mg via INTRAVENOUS
  Filled 2022-09-27: qty 1

## 2022-09-27 MED ORDER — TRASTUZUMAB-DKST CHEMO 150 MG IV SOLR
750.0000 mg | Freq: Once | INTRAVENOUS | Status: AC
  Administered 2022-09-27: 750 mg via INTRAVENOUS
  Filled 2022-09-27: qty 35.71

## 2022-09-27 MED ORDER — HEPARIN SOD (PORK) LOCK FLUSH 100 UNIT/ML IV SOLN
500.0000 [IU] | Freq: Once | INTRAVENOUS | Status: AC | PRN
  Administered 2022-09-27: 500 [IU]

## 2022-09-27 NOTE — Progress Notes (Signed)
Per Lindi Adie MD, pt ok to treat with ECHO from 03/22/22 showing LEF 55-60%. Pt to follow up with cardiologist for repeat ECHO per MD. Pt expressed understanding.

## 2022-09-27 NOTE — Progress Notes (Signed)
Per MD okay to treat today with Echo from 03/22/2022.  Pt advised to f/u with Cardiologist Dr.McLean for repeat echo.

## 2022-09-27 NOTE — Progress Notes (Signed)
Patient Care Team: Ernestene Kiel, MD as PCP - General (Internal Medicine) Haze Rushing., MD as Consulting Physician (Pain Medicine) Joya Salm, MD as Referring Physician (Orthopedic Surgery) Jaymes Graff, DO as Consulting Physician (Orthopedic Surgery) Larey Dresser, MD as Consulting Physician (Cardiology) Bensimhon, Shaune Pascal, MD as Consulting Physician (Cardiology) Lorelle Gibbs, MD (Radiology) Tommy Medal, Lavell Islam, MD as Consulting Physician (Infectious Diseases) Gery Pray, MD as Consulting Physician (Radiation Oncology) Nicholas Lose, MD as Consulting Physician (Hematology and Oncology)  DIAGNOSIS:  Encounter Diagnosis  Name Primary?   Carcinoma of right breast metastatic to multiple sites [facility]) Yes    SUMMARY OF ONCOLOGIC HISTORY: Oncology History  Breast cancer metastasized to multiple sites [facility])  09/05/2012 -  Chemotherapy   Patient is on Treatment Plan : BREAST Trastuzumab q28d     02/26/2013 Initial Diagnosis   history of inflammatory right breast cancer metastatic at presentation September 2004 with involvement of liver and bone, HER-2 positive, estrogen and progesterone receptor negative      - 11/2013 Chemotherapy   carboplatin, docetaxel and Herceptin x 6 completed April 2005     Surgery   bilateral mastectomies with bilateral axillary lymph node dissection 12/07/2004, showing             (a) on the right, a mypT1c ypN1 invasive ductal carcinoma, grade 3, estrogen and progesterone receptor negative, HER-2 positive, with an MIB-1 of 31%             (b) on the left, ypT2 ypN1 invasive ductal carcinoma, grade 2, estrogen and progesterone receptor negative, HER-2 positive, with an MIB-1 of 35%.   01/2015 - 02/2015 Radiation Therapy   Adj XRT    - 03/2018 Chemotherapy   Ixampra     CHIEF COMPLIANT: Herceptin maintenance therapy / Review bone scan  INTERVAL HISTORY: [name] is a 72 y.o. with the above-mentioned history of  metastatic cancer currently on treatment with Herceptin.  She is tolerating Herceptin very well.  She presents to the clinic for a follow-up to review bone scan. She reports that she has severe arthritis in her knees and in hands. She denies any trouble with the herceptin.   ALLERGIES:  is allergic to penicillins and adhesive [tape].  MEDICATIONS:  Current Outpatient Medications  Medication Sig Dispense Refill   acetaminophen (TYLENOL) 500 MG tablet Take 1,000 mg by mouth every 6 (six) hours as needed for mild pain or fever.      albuterol (VENTOLIN HFA) 108 (90 Base) MCG/ACT inhaler Inhale 2 puffs into the lungs every 6 (six) hours as needed for wheezing. 1 each 6   ALPRAZolam (XANAX) 1 MG tablet Take 1 tablet (1 mg total) by mouth 3 (three) times daily as needed for anxiety. 60 tablet 3   amLODipine (NORVASC) 10 MG tablet Take 1 tablet (10 mg total) by mouth every morning. 30 tablet 4   apixaban (ELIQUIS) 5 MG TABS tablet Take 1 tablet (5 mg total) by mouth 2 (two) times daily. 180 tablet 3   baclofen (LIORESAL) 10 MG tablet TAKE 1 TABLET BY MOUTH THREE TIMES DAILY AS NEEDED FOR MUSCLE SPASMS 270 tablet 0   carvedilol (COREG) 12.5 MG tablet TAKE 1 TABLET(12.5 MG) BY MOUTH TWICE DAILY 60 tablet 11   diclofenac sodium (VOLTAREN) 1 % GEL Apply 2 g topically daily as needed (for pain). Apply to knees and shoulders 100 g 6   DULoxetine (CYMBALTA) 20 MG capsule Take 20 mg by mouth daily.  enoxaparin (LOVENOX) 120 MG/0.8ML injection Inject 0.8 mLs (120 mg total) into the skin every 12 (twelve) hours. Start 5 days before surgery. (Patient not taking: Reported on 07/04/2022) 8 mL 0   furosemide (LASIX) 40 MG tablet Take 40 mg by mouth as needed.     gabapentin (NEURONTIN) 300 MG capsule TAKE 2 CAPSULES BY MOUTH THREE TIMES DAILY 540 capsule 1   losartan (COZAAR) 100 MG tablet Take 100 mg by mouth every morning.     Multiple Vitamins-Minerals (ZINC PO) Take by mouth daily.     ondansetron (ZOFRAN) 8  MG tablet Take 1 tablet (8 mg total) by mouth every 8 (eight) hours as needed for nausea or vomiting. 20 tablet 1   oxyCODONE-acetaminophen (PERCOCET) 10-325 MG tablet Take 1 tablet by mouth 4 (four) times daily as needed.     potassium chloride SA (KLOR-CON) 20 MEQ tablet Take 1 tablet (20 mEq total) by mouth daily as needed. With use of lasix 30 tablet 3   predniSONE (DELTASONE) 5 MG tablet Take 5 mg by mouth daily in the afternoon. (Patient not taking: Reported on 07/04/2022)     spironolactone (ALDACTONE) 25 MG tablet TAKE 1 TABLET(25 MG) BY MOUTH DAILY 30 tablet 11   temazepam (RESTORIL) 30 MG capsule TAKE 1 CAPSULE(30 MG) BY MOUTH AT BEDTIME 30 capsule 3   traMADol (ULTRAM) 50 MG tablet Take 1 tablet (50 mg total) by mouth every 6 (six) hours as needed. (Patient not taking: Reported on 07/04/2022) 20 tablet 0   No current facility-administered medications for this visit.   Facility-Administered Medications Ordered in Other Visits  Medication Dose Route Frequency Provider Last Rate Last Admin   sodium chloride flush (NS) 0.9 % injection 10 mL  10 mL Intravenous PRN Magrinat, Virgie Dad, MD   10 mL at 12/15/15 1200   sodium chloride flush (NS) 0.9 % injection 10 mL  10 mL Intracatheter PRN Magrinat, Virgie Dad, MD   10 mL at 08/14/18 1116   sodium chloride flush (NS) 0.9 % injection 10 mL  10 mL Intracatheter PRN Magrinat, Virgie Dad, MD        PHYSICAL EXAMINATION: ECOG PERFORMANCE STATUS: 1 - Symptomatic but completely ambulatory  Vitals:   09/27/22 0848  BP: (!) 147/81  Pulse: 73  Resp: 16  Temp: 97.8 F (36.6 C)  SpO2: 97%   Filed Weights   09/27/22 0848  Weight: 232 lb (105.2 kg)      LABORATORY DATA:  I have reviewed the data as listed    Latest Ref Rng & Units 09/27/2022    8:23 AM 08/30/2022    8:51 AM 08/02/2022    8:57 AM  CMP  Glucose 70 - 99 mg/dL 88  113  113   BUN 8 - 23 mg/dL '17  10  11   '$ Creatinine 0.44 - 1.00 mg/dL 0.83  0.91  0.65   Sodium 135 - 145 mmol/L 140   141  144   Potassium 3.5 - 5.1 mmol/L 3.6  3.6  3.7   Chloride 98 - 111 mmol/L 107  107  107   CO2 22 - 32 mmol/L '28  25  28   '$ Calcium 8.9 - 10.3 mg/dL 9.2  9.2  10.0   Total Protein 6.5 - 8.1 g/dL 7.3  7.4  7.2   Total Bilirubin 0.3 - 1.2 mg/dL 0.4  0.6  0.6   Alkaline Phos 38 - 126 U/L 72  65  67   AST 15 -  41 U/L '13  18  14   '$ ALT 0 - 44 U/L '7  9  7     '$ Lab Results  Component Value Date   WBC 7.4 09/27/2022   HGB 11.2 (L) 09/27/2022   HCT 32.0 (L) 09/27/2022   MCV 78.2 (L) 09/27/2022   PLT 247 09/27/2022   NEUTROABS 2.7 09/27/2022    ASSESSMENT & PLAN:  Breast cancer metastasized to multiple sites 2004: Liver. Lung and bone mets Neoadj chemo TCH X 6 completed April 2005 foll by herceptin maintenance   bilateral mastectomies with bilateral axillary lymph node dissection 12/07/2004, showing             (a) on the right, a mypT1c ypN1 invasive ductal carcinoma, grade 3, estrogen and progesterone receptor negative, HER-2 positive, with an MIB-1 of 31%             (b) on the left, ypT2 ypN1 invasive ductal carcinoma, grade 2, estrogen and progesterone receptor negative, HER-2 positive, with an MIB-1 of 35%.   Adj XRT ----------------------------------------------------------------------------------------------------------------------------------------  Current treatment: Herceptin maintenance therapy Plan to continue Herceptin indefinitely every 28 days.   CT chest abdomen and pelvis 04/04/2022: New soft tissue nodule suggestive of a postoperative fluid collection measuring 0.9 cm concerning for local recurrence.  No other evidence of metastatic disease. Surgical excision 05/10/2022: No evidence of malignancy.  Fat necrosis/seroma   SVC syndrome: On lifelong anticoagulation.      Bone scan 09/26/2021: No bone metastases CT CAP: Not scheduled yet. She will call Dr. Claris Gladden office and get an echo scheduled. Okay to treat without a more current echocardiogram.  Return to clinic  for monthly Herceptin infusions    No orders of the defined types were placed in this encounter.  The patient has a good understanding of the overall plan. she agrees with it. she will call with any problems that may develop before the next visit here. Total time spent: 30 mins including face to face time and time spent for planning, charting and co-ordination of care   Harriette Ohara, MD 09/27/22    I Gardiner Coins am acting as a Education administrator for [employer]  I have reviewed the above documentation for accuracy and completeness, and I agree with the above.

## 2022-09-27 NOTE — Patient Instructions (Signed)
[facility]  Discharge Instructions: Thank you for choosing [facility] to provide your oncology and hematology care.   If you have a lab appointment with the Cancer Center, please go directly to the Cancer Center and check in at the registration area.   Wear comfortable clothing and clothing appropriate for easy access to any Portacath or PICC line.   We strive to give you quality time with your provider. You may need to reschedule your appointment if you arrive late (15 or more minutes).  Arriving late affects you and other patients whose appointments are after yours.  Also, if you miss three or more appointments without notifying the office, you may be dismissed from the clinic at the provider's discretion.      For prescription refill requests, have your pharmacy contact our office and allow 72 hours for refills to be completed.    Today you received the following chemotherapy and/or immunotherapy agents: Trastuzumab.       To help prevent nausea and vomiting after your treatment, we encourage you to take your nausea medication as directed.  BELOW ARE SYMPTOMS THAT SHOULD BE REPORTED IMMEDIATELY: *FEVER GREATER THAN 100.4 F (38 C) OR HIGHER *CHILLS OR SWEATING *NAUSEA AND VOMITING THAT IS NOT CONTROLLED WITH YOUR NAUSEA MEDICATION *UNUSUAL SHORTNESS OF BREATH *UNUSUAL BRUISING OR BLEEDING *URINARY PROBLEMS (pain or burning when urinating, or frequent urination) *BOWEL PROBLEMS (unusual diarrhea, constipation, pain near the anus) TENDERNESS IN MOUTH AND THROAT WITH OR WITHOUT PRESENCE OF ULCERS (sore throat, sores in mouth, or a toothache) UNUSUAL RASH, SWELLING OR PAIN  UNUSUAL VAGINAL DISCHARGE OR ITCHING   Items with * indicate a potential emergency and should be followed up as soon as possible or go to the Emergency Department if any problems should occur.  Please show the CHEMOTHERAPY ALERT CARD or IMMUNOTHERAPY ALERT CARD at  check-in to the Emergency Department and triage nurse.  Should you have questions after your visit or need to cancel or reschedule your appointment, please contact [facility]  Dept: [phone]  and follow the prompts.  Office hours are 8:00 a.m. to 4:30 p.m. Monday - Friday. Please note that voicemails left after 4:00 p.m. may not be returned until the following business day.  We are closed weekends and major holidays. You have access to a nurse at all times for urgent questions. Please call the main number to the clinic Dept: [phone] and follow the prompts.   For any non-urgent questions, you may also contact your provider using MyChart. We now offer e-Visits for anyone 18 and older to request care online for non-urgent symptoms. For details visit [email or web address].   Also download the MyChart app! Go to the app store, search "MyChart", open the app, select [facility], and log in with your MyChart username and password.   

## 2022-09-27 NOTE — Patient Instructions (Signed)

## 2022-10-01 ENCOUNTER — Other Ambulatory Visit: Payer: Self-pay | Admitting: Hematology and Oncology

## 2022-10-01 DIAGNOSIS — Z4889 Encounter for other specified surgical aftercare: Secondary | ICD-10-CM | POA: Diagnosis not present

## 2022-10-01 DIAGNOSIS — C50911 Malignant neoplasm of unspecified site of right female breast: Secondary | ICD-10-CM

## 2022-10-03 ENCOUNTER — Telehealth: Payer: Self-pay | Admitting: Hematology and Oncology

## 2022-10-03 NOTE — Telephone Encounter (Signed)
Scheduled appointment per WQ. Patient is aware of all made appointments.

## 2022-10-04 ENCOUNTER — Other Ambulatory Visit: Payer: Self-pay

## 2022-10-05 ENCOUNTER — Telehealth: Payer: Self-pay

## 2022-10-05 NOTE — Telephone Encounter (Signed)
Notified Patient by voicemail of conversation with [facility]. Patient needs an additional $121.92 to meet out of pocket expenses before Eliquis can be mailed to her from [employer]. If Patient can supply a verification of income that is lower than the yearly amount BMS verified through credit check, then the $121.92 would not be needed. Advised Patient to try to get an income verification from [employer].

## 2022-10-10 ENCOUNTER — Other Ambulatory Visit: Payer: Self-pay

## 2022-10-18 NOTE — Progress Notes (Signed)
Pharmacist Chemotherapy Monitoring - Initial Assessment    Anticipated start date: 2.29.24  The following has been reviewed per standard work regarding the patient's treatment regimen: The patient's diagnosis, treatment plan and drug doses, and organ/hematologic function Lab orders and baseline tests specific to treatment regimen  The treatment plan start date, drug sequencing, and pre-medications Prior authorization status  Patient's documented medication list, including drug-drug interaction screen and prescriptions for anti-emetics and supportive care specific to the treatment regimen The drug concentrations, fluid compatibility, administration routes, and timing of the medications to be used The patient's access for treatment and lifetime cumulative dose history, if applicable  The patient's medication allergies and previous infusion related reactions, if applicable   Changes made to treatment plan:  N/A  Follow up needed:  N/A   Larene Beach, RPH, 10/18/2022  10:52 AM

## 2022-10-25 ENCOUNTER — Other Ambulatory Visit: Payer: Self-pay

## 2022-10-25 ENCOUNTER — Inpatient Hospital Stay: Payer: Medicare Other

## 2022-10-25 VITALS — BP 135/73 | HR 61 | Temp 98.1°F | Resp 14 | Wt 230.0 lb

## 2022-10-25 DIAGNOSIS — Z5112 Encounter for antineoplastic immunotherapy: Secondary | ICD-10-CM | POA: Diagnosis not present

## 2022-10-25 DIAGNOSIS — C50911 Malignant neoplasm of unspecified site of right female breast: Secondary | ICD-10-CM | POA: Diagnosis not present

## 2022-10-25 DIAGNOSIS — Z171 Estrogen receptor negative status [ER-]: Secondary | ICD-10-CM | POA: Diagnosis not present

## 2022-10-25 DIAGNOSIS — C50812 Malignant neoplasm of overlapping sites of left female breast: Secondary | ICD-10-CM | POA: Diagnosis not present

## 2022-10-25 DIAGNOSIS — Z7901 Long term (current) use of anticoagulants: Secondary | ICD-10-CM | POA: Diagnosis not present

## 2022-10-25 MED ORDER — TRASTUZUMAB-DTTB CHEMO 150 MG IV SOLR
750.0000 mg | Freq: Once | INTRAVENOUS | Status: AC
  Administered 2022-10-25: 750 mg via INTRAVENOUS
  Filled 2022-10-25: qty 35.72

## 2022-10-25 MED ORDER — SODIUM CHLORIDE 0.9 % IV SOLN: Freq: Once | INTRAVENOUS | Status: AC

## 2022-10-25 MED ORDER — ACETAMINOPHEN 325 MG PO TABS
650.0000 mg | ORAL_TABLET | Freq: Once | ORAL | Status: AC
  Administered 2022-10-25: 650 mg via ORAL
  Filled 2022-10-25: qty 2

## 2022-10-25 MED ORDER — SODIUM CHLORIDE 0.9% FLUSH
10.0000 mL | INTRAVENOUS | Status: DC | PRN
  Administered 2022-10-25: 10 mL

## 2022-10-25 MED ORDER — HEPARIN SOD (PORK) LOCK FLUSH 100 UNIT/ML IV SOLN
500.0000 [IU] | Freq: Once | INTRAVENOUS | Status: AC | PRN
  Administered 2022-10-25: 500 [IU]

## 2022-10-25 MED ORDER — TRASTUZUMAB-DTTB CHEMO 150 MG IV SOLR: 6.0000 mg/kg | Freq: Once | INTRAVENOUS | Status: DC

## 2022-10-25 MED ORDER — DIPHENHYDRAMINE HCL 25 MG PO CAPS
50.0000 mg | ORAL_CAPSULE | Freq: Once | ORAL | Status: AC
  Administered 2022-10-25: 50 mg via ORAL
  Filled 2022-10-25: qty 2

## 2022-10-25 NOTE — Patient Instructions (Signed)
[facility]  Discharge Instructions: Thank you for choosing [facility] to provide your oncology and hematology care.   If you have a lab appointment with the [facility], please go directly to the [facility] and check in at the registration area.   Wear comfortable clothing and clothing appropriate for easy access to any Portacath or PICC line.   We strive to give you quality time with your provider. You may need to reschedule your appointment if you arrive late (15 or more minutes).  Arriving late affects you and other patients whose appointments are after yours.  Also, if you miss three or more appointments without notifying the office, you may be dismissed from the clinic at the provider's discretion.      For prescription refill requests, have your pharmacy contact our office and allow 72 hours for refills to be completed.    Today you received the following chemotherapy and/or immunotherapy agents trastuzumab-dttb (Ontruzant)      To help prevent nausea and vomiting after your treatment, we encourage you to take your nausea medication as directed.  BELOW ARE SYMPTOMS THAT SHOULD BE REPORTED IMMEDIATELY: *FEVER GREATER THAN 100.4 F (38 C) OR HIGHER *CHILLS OR SWEATING *NAUSEA AND VOMITING THAT IS NOT CONTROLLED WITH YOUR NAUSEA MEDICATION *UNUSUAL SHORTNESS OF BREATH *UNUSUAL BRUISING OR BLEEDING *URINARY PROBLEMS (pain or burning when urinating, or frequent urination) *BOWEL PROBLEMS (unusual diarrhea, constipation, pain near the anus) TENDERNESS IN MOUTH AND THROAT WITH OR WITHOUT PRESENCE OF ULCERS (sore throat, sores in mouth, or a toothache) UNUSUAL RASH, SWELLING OR PAIN  UNUSUAL VAGINAL DISCHARGE OR ITCHING   Items with * indicate a potential emergency and should be followed up as soon as possible or go to the Emergency Department if any problems should occur.  Please show the CHEMOTHERAPY ALERT CARD or IMMUNOTHERAPY  ALERT CARD at check-in to the Emergency Department and triage nurse.  Should you have questions after your visit or need to cancel or reschedule your appointment, please contact [facility]  Dept: [phone]  and follow the prompts.  Office hours are 8:00 a.m. to 4:30 p.m. Monday - Friday. Please note that voicemails left after 4:00 p.m. may not be returned until the following business day.  We are closed weekends and major holidays. You have access to a nurse at all times for urgent questions. Please call the main number to the clinic Dept: [phone] and follow the prompts.   For any non-urgent questions, you may also contact your provider using MyChart. We now offer e-Visits for anyone 25 and older to request care online for non-urgent symptoms. For details visit [email or web address].   Also download the MyChart app! Go to the app store, search "MyChart", open the app, select [facility], and log in with your MyChart username and password.  Trastuzumab Injection What is this medication? TRASTUZUMAB (tras TOO zoo mab) treats breast cancer and stomach cancer. It works by blocking a protein that causes cancer cells to grow and multiply. This helps to slow or stop the spread of cancer cells. This medicine may be used for other purposes; ask your health care provider or pharmacist if you have questions. COMMON BRAND NAME(S): Herceptin, Janae Bridgeman, Ontruzant, Trazimera What should I tell my care team before I take this medication? They need to know if you have any of these conditions: Heart failure Lung disease An unusual or allergic reaction to trastuzumab, other  medications, foods, dyes, or preservatives Pregnant or trying to get pregnant Breast-feeding How should I use this medication? This medication is injected into a vein. It is given by your care team in a hospital or clinic setting. Talk to your care team about the use of this  medication in children. It is not approved for use in children. Overdosage: If you think you have taken too much of this medicine contact a poison control center or emergency room at once. NOTE: This medicine is only for you. Do not share this medicine with others. What if I miss a dose? Keep appointments for follow-up doses. It is important not to miss your dose. Call your care team if you are unable to keep an appointment. What may interact with this medication? Certain types of chemotherapy, such as daunorubicin, doxorubicin, epirubicin, idarubicin This list may not describe all possible interactions. Give your health care provider a list of all the medicines, herbs, non-prescription drugs, or dietary supplements you use. Also tell them if you smoke, drink alcohol, or use illegal drugs. Some items may interact with your medicine. What should I watch for while using this medication? Your condition will be monitored carefully while you are receiving this medication. This medication may make you feel generally unwell. This is not uncommon, as chemotherapy affects healthy cells as well as cancer cells. Report any side effects. Continue your course of treatment even though you feel ill unless your care team tells you to stop. This medication may increase your risk of getting an infection. Call your care team for advice if you get a fever, chills, sore throat, or other symptoms of a cold or flu. Do not treat yourself. Try to avoid being around people who are sick. Avoid taking medications that contain aspirin, acetaminophen, ibuprofen, naproxen, or ketoprofen unless instructed by your care team. These medications can hide a fever. Talk to your care team if you may be pregnant. Serious birth defects can occur if you take this medication during pregnancy and for 7 months after the last dose. You will need a negative pregnancy test before starting this medication. Contraception is recommended while taking  this medication and for 7 months after the last dose. Your care team can help you find the option that works for you. Do not breastfeed while taking this medication and for 7 months after stopping treatment. What side effects may I notice from receiving this medication? Side effects that you should report to your care team as soon as possible: Allergic reactions or angioedema--skin rash, itching or hives, swelling of the face, eyes, lips, tongue, arms, or legs, trouble swallowing or breathing Dry cough, shortness of breath or trouble breathing Heart failure--shortness of breath, swelling of the ankles, feet, or hands, sudden weight gain, unusual weakness or fatigue Infection--fever, chills, cough, or sore throat Infusion reactions--chest pain, shortness of breath or trouble breathing, feeling faint or lightheaded Side effects that usually do not require medical attention (report to your care team if they continue or are bothersome): Diarrhea Dizziness Headache Nausea Trouble sleeping Vomiting This list may not describe all possible side effects. Call your doctor for medical advice about side effects. You may report side effects to FDA at 1-800-FDA-1088. Where should I keep my medication? This medication is given in a hospital or clinic. It will not be stored at home. NOTE: This sheet is a summary. It may not cover all possible information. If you have questions about this medicine, talk to your doctor, pharmacist, or health  care provider.  2023 Elsevier/Gold Standard (2021-12-14 00:00:00)

## 2022-10-25 NOTE — Progress Notes (Signed)
Confirmed dose of Trastuzumab - MD giving flat dose of 750 mg monthly   Kennith Center, Pharm.D., CPP 10/25/2022'@9'$ :30 AM

## 2022-10-31 ENCOUNTER — Ambulatory Visit (HOSPITAL_BASED_OUTPATIENT_CLINIC_OR_DEPARTMENT_OTHER)
Admission: RE | Admit: 2022-10-31 | Discharge: 2022-10-31 | Disposition: A | Discharge status: Home / Self Care | Payer: Medicare Other | Source: Ambulatory Visit | Attending: Cardiology | Admitting: Cardiology

## 2022-10-31 ENCOUNTER — Other Ambulatory Visit (HOSPITAL_COMMUNITY): Payer: Self-pay

## 2022-10-31 ENCOUNTER — Ambulatory Visit (HOSPITAL_COMMUNITY)
Admission: RE | Admit: 2022-10-31 | Discharge: 2022-10-31 | Disposition: A | Discharge status: Home / Self Care | Payer: Medicare Other | Source: Ambulatory Visit | Attending: Internal Medicine | Admitting: Internal Medicine

## 2022-10-31 ENCOUNTER — Encounter: Payer: Self-pay | Admitting: Hematology and Oncology

## 2022-10-31 ENCOUNTER — Encounter: Payer: Self-pay | Admitting: Oncology

## 2022-10-31 ENCOUNTER — Other Ambulatory Visit: Payer: Self-pay

## 2022-10-31 ENCOUNTER — Encounter (HOSPITAL_COMMUNITY): Payer: Self-pay | Admitting: Cardiology

## 2022-10-31 VITALS — BP 110/78 | HR 66 | Wt 237.4 lb

## 2022-10-31 DIAGNOSIS — Z7901 Long term (current) use of anticoagulants: Secondary | ICD-10-CM | POA: Insufficient documentation

## 2022-10-31 DIAGNOSIS — I871 Compression of vein: Secondary | ICD-10-CM | POA: Insufficient documentation

## 2022-10-31 DIAGNOSIS — I5032 Chronic diastolic (congestive) heart failure: Secondary | ICD-10-CM | POA: Diagnosis not present

## 2022-10-31 DIAGNOSIS — I110 Hypertensive heart disease with heart failure: Secondary | ICD-10-CM | POA: Diagnosis not present

## 2022-10-31 DIAGNOSIS — Z853 Personal history of malignant neoplasm of breast: Secondary | ICD-10-CM | POA: Insufficient documentation

## 2022-10-31 DIAGNOSIS — Z8249 Family history of ischemic heart disease and other diseases of the circulatory system: Secondary | ICD-10-CM | POA: Diagnosis not present

## 2022-10-31 DIAGNOSIS — Z923 Personal history of irradiation: Secondary | ICD-10-CM | POA: Insufficient documentation

## 2022-10-31 LAB — ECHOCARDIOGRAM COMPLETE
Area-P 1/2: 5.38 cm2
Calc EF: 54.8 %
Est EF: 55
MV M vel: 5.71 m/s
MV Peak grad: 130.4 mmHg
Radius: 0.3 cm
S' Lateral: 3 cm
Single Plane A2C EF: 56.9 %
Single Plane A4C EF: 53.4 %

## 2022-10-31 MED ORDER — CARVEDILOL 12.5 MG PO TABS: 12.5000 mg | ORAL_TABLET | Freq: Two times a day (BID) | ORAL | 3 refills | Status: DC

## 2022-10-31 MED ORDER — SPIRONOLACTONE 25 MG PO TABS: ORAL_TABLET | ORAL | 3 refills | Status: DC

## 2022-10-31 NOTE — Progress Notes (Signed)
Echocardiogram 2D Echocardiogram has been performed.  [name] 10/31/2022, 8:54 AM

## 2022-10-31 NOTE — Patient Instructions (Signed)
There has been no changes to your medications.  Your physician has requested that you have an echocardiogram. Echocardiography is a painless test that uses sound waves to create images of your heart. It provides your doctor with information about the size and shape of your heart and how well your heart's chambers and valves are working. This procedure takes approximately one hour. There are no restrictions for this procedure. Please do NOT wear cologne, perfume, aftershave, or lotions (deodorant is allowed). Please arrive 15 minutes prior to your appointment time.  Your physician recommends that you schedule a follow-up appointment in: 9 months with an echocardiogram (December) ** please call the office in September to arrange your follow up appointment **  If you have any questions or concerns before your next appointment please send [place] a message through [facility] or call our office at [phone].    TO LEAVE A MESSAGE FOR THE NURSE SELECT OPTION 2, PLEASE LEAVE A MESSAGE INCLUDING: YOUR NAME DATE OF BIRTH CALL BACK NUMBER REASON FOR CALL**this is important as we prioritize the call backs  YOU WILL RECEIVE A CALL BACK THE SAME DAY AS LONG AS YOU CALL BEFORE 4:00 PM  At the [facility], you and your health needs are our priority. As part of our continuing mission to provide you with exceptional heart care, we have created designated Provider Care Teams. These Care Teams include your primary Cardiologist (physician) and Advanced Practice Providers (APPs- Physician Assistants and Nurse Practitioners) who all work together to provide you with the care you need, when you need it.   You may see any of the following providers on your designated Care Team at your next follow up: Dr Glori Bickers Dr Loralie Champagne Dr. Roxana Hires, NP Lyda Jester, [place] [facility], [place] Forestine Na, NP Audry Riles, PharmD   Please be sure to bring in all  your medications bottles to every appointment.    Thank you for choosing [facility]

## 2022-10-31 NOTE — Progress Notes (Signed)
Medication Samples have been provided to the patient.  Drug name: Eliquis       Strength: 5 mg         Qty: 4 boxes  LOT: [id number]  Exp.Date: 06/25  Dosing instructions: take 1 tablet Twice daily   The patient has been instructed regarding the correct time, dose, and frequency of taking this medication, including desired effects and most common side effects.   Zephaniah Enyeart M Mariha Sleeper 10:13 AM 10/31/2022

## 2022-10-31 NOTE — Progress Notes (Addendum)
Advanced Heart Failure Clinic Note   Patient ID: [name], female   DOB: [birth year], 72 y.o.   MRN: [id number] Oncologist: [name]  HPI: [name] is a 72 y.o. with a history of metastatic HER-2 positive breast carcinoma originally diagnosed September 2004. Started in R breast. Underwent neo-adjuvant chemo. During this time developed L breast CA. Underwent bilateral mastectomy. Lymph nodes +. 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   She received a total of 6 cycles of Taxotere/Carbo/Herceptin, completed in April 2005, after which she began single agent Herceptin, given every 4 weeks now. Plan to continue on Herceptin indefinitely.   Coronary CTA in 8/20 showed no significant CAD, calcium score 0.   She returns for cardio-oncology followup.  She takes Lasix a few times/month.  She uses a walker for balance with her knee pain.  No significant exertional dyspnea and no chest pain.  Weight is down 32 lbs; she has been working on her diet.      Labs 11/15: K 4, creatinine 1.0   Labs 1/16: K 4.1, creatinine 1.0 Labs 8/16: K 4.3, creatinine 0.9 Labs 11/16: K 3.9, creatinine 0.9 Labs 5/17: K 3.9, creatinine 0.68 Labs 5/18: K 4, creatinine 0.65 Labs 1/19: K 4.1, creatinine 1.23 Labs 7/19: K 3.9, creatinine 1.11 Labs 1/20: K 3.8, creatinine 0.83 Labs 7/20: K 4.1, creatinine 1.18, LDL 112 Labs 3/21: K 4.2, creatinine 0.86 Labs 4/21: LDL 115, HDL 105 Labs 3/22: K 4.5, creatinine 0.86 Labs 9/22: K 4.3, creatinine 0.98, hgb 11.1 Labs 7/23: K 3.7, creatinine 1.0 Labs 2/24: K 3.6, creatinine 0.83  04/23/12 ECHO EF 60-65% Lateral s' 8.9 cm/s  07/30/12 ECHO EF 60-65% Lateral s' 8.3 cm/s  09/11/12 ECHO EF 60-65% Lateral s' 10.3 cm/s  12/22/12 ECHO 55-60% Lateral S' 9.8 RV mildly dilated  07/01/13 ECHO 55-60%, lateral s' 9.79, mild RV dilation, grade II DD 3/15 ECHO 55%, mild MR, lateral s' 9.6, GLS -19.2% 03/02/14 ECHO EF 55-60% Lateral S' 9.4 GLS  - 21.9  11/15 ECHO EF 60-65%, lateral S' 6.8, GLS -17.2%, mild RV dilation with normal systolic function, mild MR.  2/16 ECHO EF 60-65%, lateral S' 14.4, GLS -[id number], normal diastolic function, mild RV dilation with normal RV systolic function.  8/16 ECHO EF 60-65%, mild LVH, normal RV size and systolic function, lateral s' 13.2, GLS -17%.  11/16 ECHO EF 60-65% Lateral S' 10.2, GLS -20.2% 5/17 ECHO EF 60-65%, mild LVH, lateral s' 12.2, GLS -20.8%, grade II diastolic dysfunction, normal RV. 9/17 ECHO EF 0000000, normal diastolic function, GLS -[id number] 12/17 ECHO EF 55-60%, moderate diastolic dysfunction, GLS -20.1%, normal RV size and systolic function 0000000 ECHO EF 55-60%, GLS -[id number], normal diastolic function, normal RV size and systolic function.  1/19 ECHO EF 55-60%, GLS -[id number], normal diastolic function, RV normal size and systolic function.  7/19 ECHO EF 60-65%, GLS -22.1%.  1/20 ECHO EF [id number], normal diastolic function, normal RV, strain not done.  6/20 ECHO EF 123456, normal diastolic function, normal RV, GLS -24.4% 4/21 ECHO EF 0000000, normal diastolic function, normal RV, strain tracking was not accurate.  1/22 ECHO EF 60-65%, normal RV, moderate LAE, IVC normal.  9/22 ECHO EF 60-65%, normal RV 7/23 ECHO EF 55-60%, GLS -19.1%, normal RV, mild MR.  2/24 ECHO EF 55%, GLS -21.6%, normal RV   ROS: All systems negative except as listed in HPI, PMH and Problem List.  Past Medical History:  Diagnosis Date   Anemia  pt pt report   Anxiety    Breast cancer ([name])    mets to liver and lung   Breast cancer metastasized to multiple sites [name]) 02/26/2013   Cellulitis    CHF (congestive heart failure) ([place])    History of chemotherapy 09/2004   taxotere/herceptin/carboplatin   Hypertension    Neuropathy    Radiation 07/31/2006   left upper chest   Radiation 06/17/2006-06/27/2006   6480 cGy bilat. chest wall   SVC syndrome    Thrombosis     Current Outpatient Medications  Medication  Sig Dispense Refill   acetaminophen (TYLENOL) 500 MG tablet Take 1,000 mg by mouth every 6 (six) hours as needed for mild pain or fever.      albuterol (VENTOLIN HFA) 108 (90 Base) MCG/ACT inhaler Inhale 2 puffs into the lungs every 6 (six) hours as needed for wheezing. 1 each 6   ALPRAZolam (XANAX) 1 MG tablet Take 1 tablet (1 mg total) by mouth 3 (three) times daily as needed for anxiety. 60 tablet 3   amLODipine (NORVASC) 10 MG tablet Take 1 tablet (10 mg total) by mouth every morning. 30 tablet 4   apixaban (ELIQUIS) 5 MG TABS tablet Take 1 tablet (5 mg total) by mouth 2 (two) times daily. 180 tablet 3   baclofen (LIORESAL) 10 MG tablet TAKE 1 TABLET BY MOUTH THREE TIMES DAILY AS NEEDED FOR MUSCLE SPASMS 270 tablet 0   diclofenac sodium (VOLTAREN) 1 % GEL Apply 2 g topically daily as needed (for pain). Apply to knees and shoulders 100 g 6   DULoxetine (CYMBALTA) 20 MG capsule Take 20 mg by mouth daily.     furosemide (LASIX) 40 MG tablet Take 40 mg by mouth as needed.     gabapentin (NEURONTIN) 300 MG capsule TAKE 2 CAPSULES BY MOUTH THREE TIMES DAILY 540 capsule 1   losartan (COZAAR) 100 MG tablet Take 100 mg by mouth every morning.     Multiple Vitamins-Minerals (ZINC PO) Take by mouth daily.     ondansetron (ZOFRAN) 8 MG tablet Take 1 tablet (8 mg total) by mouth every 8 (eight) hours as needed for nausea or vomiting. 20 tablet 1   oxyCODONE-acetaminophen (PERCOCET) 10-325 MG tablet Take 1 tablet by mouth 4 (four) times daily as needed.     potassium chloride SA (KLOR-CON) 20 MEQ tablet Take 1 tablet (20 mEq total) by mouth daily as needed. With use of lasix 30 tablet 3   predniSONE (DELTASONE) 5 MG tablet Take 5 mg by mouth daily in the afternoon.     temazepam (RESTORIL) 30 MG capsule TAKE 1 CAPSULE(30 MG) BY MOUTH AT BEDTIME 30 capsule 3   carvedilol (COREG) 12.5 MG tablet Take 1 tablet (12.5 mg total) by mouth 2 (two) times daily with a meal. 180 tablet 3   spironolactone (ALDACTONE) 25  MG tablet Take 1 tablet daily 90 tablet 3   No current facility-administered medications for this encounter.   Facility-Administered Medications Ordered in Other Encounters  Medication Dose Route Frequency Provider Last Rate Last Admin   sodium chloride flush (NS) 0.9 % injection 10 mL  10 mL Intravenous PRN Magrinat, Virgie Dad, MD   10 mL at 12/15/15 1200   sodium chloride flush (NS) 0.9 % injection 10 mL  10 mL Intracatheter PRN Magrinat, Virgie Dad, MD   10 mL at 08/14/18 1116   sodium chloride flush (NS) 0.9 % injection 10 mL  10 mL Intracatheter PRN Magrinat, Virgie Dad, MD  Vitals:   10/31/22 0856  BP: 110/78  Pulse: 66  SpO2: 98%  Weight: 107.7 kg (237 lb 6.4 oz)    PHYSICAL EXAM: General: NAD Neck: No JVD, no thyromegaly or thyroid nodule.  Lungs: Clear to auscultation bilaterally with normal respiratory effort. CV: Nondisplaced PMI.  Heart regular S1/S2, no S3/S4, no murmur.  No peripheral edema.  No carotid bruit.  Normal pedal pulses.  Abdomen: Soft, nontender, no hepatosplenomegaly, no distention.  Skin: Intact without lesions or rashes.  Neurologic: Alert and oriented x 3.  Psych: Normal affect. Extremities: No clubbing or cyanosis.  HEENT: Normal.   ASSESSMENT & PLAN: 1) L Breast Cancer s/p bilateral mastectomies:  Symptomatically stable. I reviewed today's echo, EF and strain are stable. She will be continuing Herceptin indefinitely.   - Continue echo screening but have increased the interval given stability over a number of years.  OK to repeat echo in 9 months as long as she does not develop any worrisome cardiac symptoms.    2) Suspected OSA: She has not wanted to do a sleep study.    3) HTN: BP controlled on current regimen.     4) Chronic diastolic CHF: She is not volume overloaded on exam, NYHA class I-II.    - Can continue to take Lasix prn.   5) SVC syndrome: She has been transitioned from warfarin to Eliquis.  Having a hard time affording Eliquis, will  involve our pharmacist to see if we can get this covered. 6) Coronary disease risk: She has a strong FH of coronary disease, as well as HTN.  Coronary CTA in 8/20 showed no significant coronary disease and calcium score 0.  - No ASA given Eliquis use.   Followup 9 months with echo.   Loralie Champagne 10/31/2022

## 2022-11-01 ENCOUNTER — Telehealth: Payer: Self-pay | Admitting: Hematology and Oncology

## 2022-11-01 NOTE — Telephone Encounter (Signed)
Scheduled appointment per WQ. Patient is aware of the made appointment.

## 2022-11-07 ENCOUNTER — Encounter: Payer: Self-pay | Admitting: *Deleted

## 2022-11-07 NOTE — Progress Notes (Signed)
Per pt request RN successfully faxed order to [facility] for bras.

## 2022-11-14 DIAGNOSIS — M170 Bilateral primary osteoarthritis of knee: Secondary | ICD-10-CM | POA: Diagnosis not present

## 2022-11-14 DIAGNOSIS — R32 Unspecified urinary incontinence: Secondary | ICD-10-CM | POA: Diagnosis not present

## 2022-11-14 DIAGNOSIS — I10 Essential (primary) hypertension: Secondary | ICD-10-CM | POA: Diagnosis not present

## 2022-11-14 DIAGNOSIS — I5032 Chronic diastolic (congestive) heart failure: Secondary | ICD-10-CM | POA: Diagnosis not present

## 2022-11-14 DIAGNOSIS — Z79891 Long term (current) use of opiate analgesic: Secondary | ICD-10-CM | POA: Diagnosis not present

## 2022-11-14 DIAGNOSIS — C50919 Malignant neoplasm of unspecified site of unspecified female breast: Secondary | ICD-10-CM | POA: Diagnosis not present

## 2022-11-14 DIAGNOSIS — R0683 Snoring: Secondary | ICD-10-CM | POA: Diagnosis not present

## 2022-11-14 DIAGNOSIS — C7951 Secondary malignant neoplasm of bone: Secondary | ICD-10-CM | POA: Diagnosis not present

## 2022-11-14 DIAGNOSIS — G4700 Insomnia, unspecified: Secondary | ICD-10-CM | POA: Diagnosis not present

## 2022-11-14 DIAGNOSIS — M6530 Trigger finger, unspecified finger: Secondary | ICD-10-CM | POA: Diagnosis not present

## 2022-11-14 DIAGNOSIS — Z6841 Body Mass Index (BMI) 40.0 and over, adult: Secondary | ICD-10-CM | POA: Diagnosis not present

## 2022-11-16 ENCOUNTER — Other Ambulatory Visit: Payer: Self-pay | Admitting: *Deleted

## 2022-11-21 ENCOUNTER — Inpatient Hospital Stay (HOSPITAL_BASED_OUTPATIENT_CLINIC_OR_DEPARTMENT_OTHER): Payer: Medicare Other | Admitting: Adult Health

## 2022-11-21 ENCOUNTER — Inpatient Hospital Stay: Payer: Medicare Other

## 2022-11-21 ENCOUNTER — Encounter: Payer: Self-pay | Admitting: Adult Health

## 2022-11-21 ENCOUNTER — Inpatient Hospital Stay: Payer: Medicare Other | Attending: Oncology

## 2022-11-21 ENCOUNTER — Telehealth (HOSPITAL_COMMUNITY): Payer: Self-pay

## 2022-11-21 ENCOUNTER — Other Ambulatory Visit: Payer: Self-pay

## 2022-11-21 VITALS — BP 110/60 | HR 64 | Resp 18

## 2022-11-21 VITALS — BP 145/74 | HR 68 | Temp 97.8°F | Resp 18 | Ht 63.0 in | Wt 247.8 lb

## 2022-11-21 DIAGNOSIS — C50812 Malignant neoplasm of overlapping sites of left female breast: Secondary | ICD-10-CM | POA: Diagnosis not present

## 2022-11-21 DIAGNOSIS — Z171 Estrogen receptor negative status [ER-]: Secondary | ICD-10-CM | POA: Insufficient documentation

## 2022-11-21 DIAGNOSIS — C50911 Malignant neoplasm of unspecified site of right female breast: Secondary | ICD-10-CM | POA: Insufficient documentation

## 2022-11-21 DIAGNOSIS — C50012 Malignant neoplasm of nipple and areola, left female breast: Secondary | ICD-10-CM

## 2022-11-21 DIAGNOSIS — Z95828 Presence of other vascular implants and grafts: Secondary | ICD-10-CM

## 2022-11-21 DIAGNOSIS — C50011 Malignant neoplasm of nipple and areola, right female breast: Secondary | ICD-10-CM

## 2022-11-21 DIAGNOSIS — F419 Anxiety disorder, unspecified: Secondary | ICD-10-CM | POA: Diagnosis not present

## 2022-11-21 DIAGNOSIS — Z5112 Encounter for antineoplastic immunotherapy: Secondary | ICD-10-CM | POA: Diagnosis not present

## 2022-11-21 DIAGNOSIS — C50919 Malignant neoplasm of unspecified site of unspecified female breast: Secondary | ICD-10-CM

## 2022-11-21 LAB — CBC WITH DIFFERENTIAL (CANCER CENTER ONLY)
Abs Immature Granulocytes: 0.02 10*3/uL (ref 0.00–0.07)
Basophils Absolute: 0 10*3/uL (ref 0.0–0.1)
Basophils Relative: 0 %
Eosinophils Absolute: 0.1 10*3/uL (ref 0.0–0.5)
Eosinophils Relative: 1 %
HCT: 29 % — ABNORMAL LOW (ref 36.0–46.0)
Hemoglobin: 10.2 g/dL — ABNORMAL LOW (ref 12.0–15.0)
Immature Granulocytes: 0 %
Lymphocytes Relative: 28 %
Lymphs Abs: 2.7 10*3/uL (ref 0.7–4.0)
MCH: 28.7 pg (ref 26.0–34.0)
MCHC: 35.2 g/dL (ref 30.0–36.0)
MCV: 81.5 fL (ref 80.0–100.0)
Monocytes Absolute: 0.8 10*3/uL (ref 0.1–1.0)
Monocytes Relative: 9 %
Neutro Abs: 6 10*3/uL (ref 1.7–7.7)
Neutrophils Relative %: 62 %
Platelet Count: 204 10*3/uL (ref 150–400)
RBC: 3.56 MIL/uL — ABNORMAL LOW (ref 3.87–5.11)
RDW: 16.3 % — ABNORMAL HIGH (ref 11.5–15.5)
WBC Count: 9.7 10*3/uL (ref 4.0–10.5)
nRBC: 0 % (ref 0.0–0.2)

## 2022-11-21 LAB — CMP (CANCER CENTER ONLY)
ALT: 9 U/L (ref 0–44)
AST: 9 U/L — ABNORMAL LOW (ref 15–41)
Albumin: 3.4 g/dL — ABNORMAL LOW (ref 3.5–5.0)
Alkaline Phosphatase: 86 U/L (ref 38–126)
Anion gap: 4 — ABNORMAL LOW (ref 5–15)
BUN: 24 mg/dL — ABNORMAL HIGH (ref 8–23)
CO2: 30 mmol/L (ref 22–32)
Calcium: 8.5 mg/dL — ABNORMAL LOW (ref 8.9–10.3)
Chloride: 108 mmol/L (ref 98–111)
Creatinine: 0.81 mg/dL (ref 0.44–1.00)
GFR, Estimated: >60 mL/min (ref >60)
Glucose, Bld: 81 mg/dL (ref 70–99)
Potassium: 4.1 mmol/L (ref 3.5–5.1)
Sodium: 142 mmol/L (ref 135–145)
Total Bilirubin: 0.5 mg/dL (ref 0.3–1.2)
Total Protein: 6.8 g/dL (ref 6.5–8.1)

## 2022-11-21 MED ORDER — ALPRAZOLAM 1 MG PO TABS: 1.0000 mg | ORAL_TABLET | Freq: Three times a day (TID) | ORAL | 3 refills | Status: DC | PRN

## 2022-11-21 MED ORDER — ACETAMINOPHEN 325 MG PO TABS
650.0000 mg | ORAL_TABLET | Freq: Once | ORAL | Status: AC
  Administered 2022-11-21: 650 mg via ORAL
  Filled 2022-11-21: qty 2

## 2022-11-21 MED ORDER — TRASTUZUMAB-DTTB CHEMO 150 MG IV SOLR
750.0000 mg | Freq: Once | INTRAVENOUS | Status: AC
  Administered 2022-11-21: 750 mg via INTRAVENOUS
  Filled 2022-11-21: qty 35.72

## 2022-11-21 MED ORDER — DIPHENHYDRAMINE HCL 25 MG PO CAPS
50.0000 mg | ORAL_CAPSULE | Freq: Once | ORAL | Status: AC
  Administered 2022-11-21: 50 mg via ORAL
  Filled 2022-11-21: qty 2

## 2022-11-21 MED ORDER — SODIUM CHLORIDE 0.9% FLUSH
10.0000 mL | INTRAVENOUS | Status: DC | PRN
  Administered 2022-11-21: 10 mL

## 2022-11-21 MED ORDER — SODIUM CHLORIDE 0.9 % IV SOLN: Freq: Once | INTRAVENOUS | Status: AC

## 2022-11-21 MED ORDER — HEPARIN SOD (PORK) LOCK FLUSH 100 UNIT/ML IV SOLN
500.0000 [IU] | Freq: Once | INTRAVENOUS | Status: AC | PRN
  Administered 2022-11-21: 500 [IU]

## 2022-11-21 MED ORDER — SODIUM CHLORIDE 0.9 % IJ SOLN: 10.0000 mL | INTRAMUSCULAR | Status: DC | PRN

## 2022-11-21 MED ORDER — SODIUM CHLORIDE 0.9% FLUSH
10.0000 mL | Freq: Once | INTRAVENOUS | Status: AC
  Administered 2022-11-21: 10 mL via INTRAVENOUS

## 2022-11-21 NOTE — Assessment & Plan Note (Signed)
[name] is a 72 year old woman with stage IV breast cancer on treatment with Trastuzumab.   Toxicities:  Tolerating well.  Next echo due 04/2023.  Anxiety-continues on Alprazolam with good tolerance, pmp aware reviewed-no red flags, refills prescribed  Knee pain: continues on percocet as prescribed by Dr. Marcello Moores. She is functional with this and has no pain elsewhere she notes.    [name] has no clinical signs of breast cancer progression. She will return in 4 weeks for f/u and her next treatment.

## 2022-11-21 NOTE — Telephone Encounter (Signed)
Received a fax requesting medical records from [facility] Records were successfully faxed to: [phone] ,which was the number provided.. Medical request form will be scanned into patients chart.

## 2022-11-21 NOTE — Patient Instructions (Signed)
[facility]  Discharge Instructions: Thank you for choosing [facility] to provide your oncology and hematology care.   If you have a lab appointment with the Cancer Center, please go directly to the Cancer Center and check in at the registration area.   Wear comfortable clothing and clothing appropriate for easy access to any Portacath or PICC line.   We strive to give you quality time with your provider. You may need to reschedule your appointment if you arrive late (15 or more minutes).  Arriving late affects you and other patients whose appointments are after yours.  Also, if you miss three or more appointments without notifying the office, you may be dismissed from the clinic at the provider's discretion.      For prescription refill requests, have your pharmacy contact our office and allow 72 hours for refills to be completed.    Today you received the following chemotherapy and/or immunotherapy agents trastuzumab-dttb (Ontruzant)      To help prevent nausea and vomiting after your treatment, we encourage you to take your nausea medication as directed.  BELOW ARE SYMPTOMS THAT SHOULD BE REPORTED IMMEDIATELY: *FEVER GREATER THAN 100.4 F (38 C) OR HIGHER *CHILLS OR SWEATING *NAUSEA AND VOMITING THAT IS NOT CONTROLLED WITH YOUR NAUSEA MEDICATION *UNUSUAL SHORTNESS OF BREATH *UNUSUAL BRUISING OR BLEEDING *URINARY PROBLEMS (pain or burning when urinating, or frequent urination) *BOWEL PROBLEMS (unusual diarrhea, constipation, pain near the anus) TENDERNESS IN MOUTH AND THROAT WITH OR WITHOUT PRESENCE OF ULCERS (sore throat, sores in mouth, or a toothache) UNUSUAL RASH, SWELLING OR PAIN  UNUSUAL VAGINAL DISCHARGE OR ITCHING   Items with * indicate a potential emergency and should be followed up as soon as possible or go to the Emergency Department if any problems should occur.  Please show the CHEMOTHERAPY ALERT CARD or IMMUNOTHERAPY  ALERT CARD at check-in to the Emergency Department and triage nurse.  Should you have questions after your visit or need to cancel or reschedule your appointment, please contact [facility]  Dept: [phone]  and follow the prompts.  Office hours are 8:00 a.m. to 4:30 p.m. Monday - Friday. Please note that voicemails left after 4:00 p.m. may not be returned until the following business day.  We are closed weekends and major holidays. You have access to a nurse at all times for urgent questions. Please call the main number to the clinic Dept: [phone] and follow the prompts.   For any non-urgent questions, you may also contact your provider using MyChart. We now offer e-Visits for anyone 18 and older to request care online for non-urgent symptoms. For details visit [email or web address].   Also download the MyChart app! Go to the app store, search "MyChart", open the app, select [place] [facility], and log in with your MyChart username and password.  Trastuzumab Injection What is this medication? TRASTUZUMAB (tras TOO zoo mab) treats breast cancer and stomach cancer. It works by blocking a protein that causes cancer cells to grow and multiply. This helps to slow or stop the spread of cancer cells. This medicine may be used for other purposes; ask your health care provider or pharmacist if you have questions. COMMON BRAND NAME(S): Herceptin, Herzuma, KANJINTI, Ogivri, Ontruzant, Trazimera What should I tell my care team before I take this medication? They need to know if you have any of these conditions: Heart failure Lung disease An unusual or allergic reaction to trastuzumab, other   medications, foods, dyes, or preservatives Pregnant or trying to get pregnant Breast-feeding How should I use this medication? This medication is injected into a vein. It is given by your care team in a hospital or clinic setting. Talk to your care team about the use of this  medication in children. It is not approved for use in children. Overdosage: If you think you have taken too much of this medicine contact a poison control center or emergency room at once. NOTE: This medicine is only for you. Do not share this medicine with others. What if I miss a dose? Keep appointments for follow-up doses. It is important not to miss your dose. Call your care team if you are unable to keep an appointment. What may interact with this medication? Certain types of chemotherapy, such as daunorubicin, doxorubicin, epirubicin, idarubicin This list may not describe all possible interactions. Give your health care provider a list of all the medicines, herbs, non-prescription drugs, or dietary supplements you use. Also tell them if you smoke, drink alcohol, or use illegal drugs. Some items may interact with your medicine. What should I watch for while using this medication? Your condition will be monitored carefully while you are receiving this medication. This medication may make you feel generally unwell. This is not uncommon, as chemotherapy affects healthy cells as well as cancer cells. Report any side effects. Continue your course of treatment even though you feel ill unless your care team tells you to stop. This medication may increase your risk of getting an infection. Call your care team for advice if you get a fever, chills, sore throat, or other symptoms of a cold or flu. Do not treat yourself. Try to avoid being around people who are sick. Avoid taking medications that contain aspirin, acetaminophen, ibuprofen, naproxen, or ketoprofen unless instructed by your care team. These medications can hide a fever. Talk to your care team if you may be pregnant. Serious birth defects can occur if you take this medication during pregnancy and for 7 months after the last dose. You will need a negative pregnancy test before starting this medication. Contraception is recommended while taking  this medication and for 7 months after the last dose. Your care team can help you find the option that works for you. Do not breastfeed while taking this medication and for 7 months after stopping treatment. What side effects may I notice from receiving this medication? Side effects that you should report to your care team as soon as possible: Allergic reactions or angioedema--skin rash, itching or hives, swelling of the face, eyes, lips, tongue, arms, or legs, trouble swallowing or breathing Dry cough, shortness of breath or trouble breathing Heart failure--shortness of breath, swelling of the ankles, feet, or hands, sudden weight gain, unusual weakness or fatigue Infection--fever, chills, cough, or sore throat Infusion reactions--chest pain, shortness of breath or trouble breathing, feeling faint or lightheaded Side effects that usually do not require medical attention (report to your care team if they continue or are bothersome): Diarrhea Dizziness Headache Nausea Trouble sleeping Vomiting This list may not describe all possible side effects. Call your doctor for medical advice about side effects. You may report side effects to FDA at 1-800-FDA-1088. Where should I keep my medication? This medication is given in a hospital or clinic. It will not be stored at home. NOTE: This sheet is a summary. It may not cover all possible information. If you have questions about this medicine, talk to your doctor, pharmacist, or health   care provider.  2023 Elsevier/Gold Standard (2021-12-14 00:00:00)   

## 2022-11-21 NOTE — Progress Notes (Signed)
[facility] up:    Ernestene Kiel, MD [facility] [address] [place] [address]   DIAGNOSIS:  Cancer Staging  Breast cancer metastasized to multiple sites [facility]) Staging form: Breast, AJCC 7th Edition - Clinical: Stage IV (M1) - Unsigned  Malignant neoplasm of both breasts in female, estrogen receptor negative ([place]) Staging form: Breast, AJCC 7th Edition - Clinical: Stage IV ([place], NX, M1) - Unsigned   SUMMARY OF ONCOLOGIC HISTORY: Oncology History  Breast cancer metastasized to multiple sites ([place])  09/05/2012 - 09/27/2022 Chemotherapy   Patient is on Treatment Plan : BREAST Trastuzumab q28d     02/26/2013 Initial Diagnosis   history of inflammatory right breast cancer metastatic at presentation September 2004 with involvement of liver and bone, HER-2 positive, estrogen and progesterone receptor negative      - 11/2013 Chemotherapy   carboplatin, docetaxel and Herceptin x 6 completed April 2005     Surgery   bilateral mastectomies with bilateral axillary lymph node dissection 12/07/2004, showing             (a) on the right, a mypT1c ypN1 invasive ductal carcinoma, grade 3, estrogen and progesterone receptor negative, HER-2 positive, with an MIB-1 of 31%             (b) on the left, ypT2 ypN1 invasive ductal carcinoma, grade 2, estrogen and progesterone receptor negative, HER-2 positive, with an MIB-1 of 35%.   01/2015 - 02/2015 Radiation Therapy   Adj XRT    - 03/2018 Chemotherapy   Ixampra   10/25/2022 -  Chemotherapy   Patient is on Treatment Plan : BREAST Trastuzumab IV (8/6) or SQ (600) D1 q21d       CURRENT THERAPY: Trastuzumab  INTERVAL HISTORY: [name] 72 y.o. female returns for f/u of her metastatic breast cancer on treatment with Trastuzumab.  She continues to tolerate her treatment well.  She tells me that she feels somewhat off today because her husband (who has previously lost one leg) just learned that his foot wound is infected.     She struggles with knee pain and arthritis. She is taking Percocet for her pain which helps with her functioning. She also struggles with anxiety and takes Alprazolam PRN.  She says this helps her anxiety when she starts feeling overwhelmed.  PMP aware reviewed, no red flags.  She is unsure if she has any refills left on the Alprazolam.    Her most recent echocardiogram occurred on October 31, 2022 demonstrating a left ventricular ejection fraction of 55% with normal global longitudinal strain.   Patient Active Problem List   Diagnosis Date Noted   Goals of care, counseling/discussion 08/13/2019   Port-A-Cath in place 08/14/2018   Port catheter in place 01/26/2016   Obesity, morbid, BMI 50 or higher ([place]) 09/22/2015   Malignant neoplasm of both breasts in female, estrogen receptor negative ([place]) 09/22/2015   Coagulopathy ([place]) 09/22/2015   Peripheral neuropathy 09/22/2015   Hot flashes 03/24/2015   Cellulitis of chest wall 06/16/2014   Right shoulder pain 06/12/2013   Neck pain 06/12/2013   Gram-negative bacteremia ([place]) 03/21/2013   Anemia of chronic disease 03/21/2013   Breast cancer metastasized to multiple sites [facility]) 02/26/2013   CHF (congestive heart failure) ([place]) 09/11/2012   SVC syndrome 09/05/2012   Neuropathy 09/05/2012   Hypertension 09/05/2012   Anxiety 09/05/2012    is allergic to penicillins and adhesive [tape].  MEDICAL HISTORY: Past Medical History:  Diagnosis Date  Anemia    pt pt report   Anxiety    Breast cancer ([place])    mets to liver and lung   Breast cancer metastasized to multiple sites [facility]) 02/26/2013   Cellulitis    CHF (congestive heart failure) ([place])    History of chemotherapy 09/2004   taxotere/herceptin/carboplatin   Hypertension    Neuropathy    Radiation 07/31/2006   left upper chest   Radiation 06/17/2006-06/27/2006   6480 cGy bilat. chest wall   SVC syndrome    Thrombosis     SURGICAL HISTORY: Past Surgical History:  Procedure  Laterality Date   ANKLE SURGERY Left    BACK SURGERY     CHOLECYSTECTOMY  08/28/1987   MASS EXCISION Left 05/10/2022   Procedure: EXCISION OF LEFT CHEST WALL MASS;  Surgeon: Erroll Luna, MD;  Location: [facility];  Service: General;  Laterality: Left;   MASTECTOMY Bilateral    w/ lymph node removal per patient   [place] ([place] HX)     SURGICAL EXCISION OF EXCESSIVE SKIN     TUBAL LIGATION  08/27/1984    SOCIAL HISTORY: Social History   Socioeconomic History   Marital status: Married    Spouse name: Not on file   Number of children: Not on file   Years of education: Not on file   Highest education level: Not on file  Occupational History   Not on file  Tobacco Use   Smoking status: Never    Passive exposure: Never   Smokeless tobacco: Never  Vaping Use   Vaping Use: Never used  Substance and Sexual Activity   Alcohol use: Yes    Comment: occasional- once a month   Drug use: No   Sexual activity: Yes    Birth control/protection: Post-menopausal  Other Topics Concern   Not on file  Social History Narrative   Not on file   Social Determinants of Health   Financial Resource Strain: Not on file  Food Insecurity: Not on file  Transportation Needs: Not on file  Physical Activity: Not on file  Stress: Not on file  Social Connections: Not on file  Intimate Partner Violence: Not on file    FAMILY HISTORY: Family History  Problem Relation Age of Onset   Heart failure Father    Cancer Father        Prostate cancer   Heart failure Brother    Cancer Brother        Prostate cancer   Diabetes Maternal Aunt     Review of Systems  Constitutional:  Negative for appetite change, chills, fatigue, fever and unexpected weight change.  HENT:   Negative for hearing loss, lump/mass and trouble swallowing.   Eyes:  Negative for eye problems and icterus.  Respiratory:  Negative for chest  tightness, cough and shortness of breath.   Cardiovascular:  Negative for chest pain, leg swelling and palpitations.  Gastrointestinal:  Negative for abdominal distention, abdominal pain, constipation, diarrhea, nausea and vomiting.  Endocrine: Negative for hot flashes.  Genitourinary:  Negative for difficulty urinating.   Musculoskeletal:  Negative for arthralgias.  Skin:  Negative for itching and rash.  Neurological:  Negative for dizziness, extremity weakness, headaches and numbness.  Hematological:  Negative for adenopathy. Does not bruise/bleed easily.  Psychiatric/Behavioral:  Negative for depression. The patient is not nervous/anxious.       PHYSICAL EXAMINATION  ECOG PERFORMANCE  STATUS: 1 - Symptomatic but completely ambulatory  Vitals:   11/21/22 0947  BP: (!) 145/74  Pulse: 68  Resp: 18  Temp: 97.8 F (36.6 C)  SpO2: 97%    Physical Exam Constitutional:      General: She is not in acute distress.    Appearance: Normal appearance. She is not toxic-appearing.  HENT:     Head: Normocephalic and atraumatic.  Eyes:     General: No scleral icterus. Cardiovascular:     Rate and Rhythm: Normal rate and regular rhythm.     Pulses: Normal pulses.     Heart sounds: Normal heart sounds.  Pulmonary:     Effort: Pulmonary effort is normal.     Breath sounds: Normal breath sounds.  Abdominal:     General: Abdomen is flat. Bowel sounds are normal. There is no distension.     Palpations: Abdomen is soft.     Tenderness: There is no abdominal tenderness.  Musculoskeletal:        General: No swelling.     Cervical back: Neck supple.  Lymphadenopathy:     Cervical: No cervical adenopathy.  Skin:    General: Skin is warm and dry.     Findings: No rash.  Neurological:     General: No focal deficit present.     Mental Status: She is alert.  Psychiatric:        Mood and Affect: Mood normal.        Behavior: Behavior normal.     LABORATORY DATA:  CBC    Component  Value Date/Time   WBC 9.7 11/21/2022 0929   WBC 9.3 04/27/2021 0822   RBC 3.56 (L) 11/21/2022 0929   HGB 10.2 (L) 11/21/2022 0929   HGB 12.0 08/14/2017 0810   HCT 29.0 (L) 11/21/2022 0929   HCT 34.7 (L) 08/14/2017 0810   PLT 204 11/21/2022 0929   PLT 236 08/14/2017 0810   MCV 81.5 11/21/2022 0929   MCV 83.8 08/14/2017 0810   MCH 28.7 11/21/2022 0929   MCHC 35.2 11/21/2022 0929   RDW 16.3 (H) 11/21/2022 0929   RDW 14.7 (H) 08/14/2017 0810   LYMPHSABS 2.7 11/21/2022 0929   LYMPHSABS 2.0 08/14/2017 0810   MONOABS 0.8 11/21/2022 0929   MONOABS 0.5 08/14/2017 0810   EOSABS 0.1 11/21/2022 0929   EOSABS 0.2 08/14/2017 0810   BASOSABS 0.0 11/21/2022 0929   BASOSABS 0.0 08/14/2017 0810    CMP     Component Value Date/Time   NA 142 11/21/2022 0929   NA 143 08/14/2017 0811   K 4.1 11/21/2022 0929   K 3.8 08/14/2017 0811   CL 108 11/21/2022 0929   CL 104 01/30/2013 0850   CO2 30 11/21/2022 0929   CO2 24 08/14/2017 0811   GLUCOSE 81 11/21/2022 0929   GLUCOSE 87 08/14/2017 0811   GLUCOSE 99 01/30/2013 0850   BUN 24 (H) 11/21/2022 0929   BUN 14.9 08/14/2017 0811   CREATININE 0.81 11/21/2022 0929   CREATININE 0.9 08/14/2017 0811   CALCIUM 8.5 (L) 11/21/2022 0929   CALCIUM 9.4 08/14/2017 0811   PROT 6.8 11/21/2022 0929   PROT 7.4 08/14/2017 0811   ALBUMIN 3.4 (L) 11/21/2022 0929   ALBUMIN 3.5 08/14/2017 0811   AST 9 (L) 11/21/2022 0929   AST 10 08/14/2017 0811   ALT 9 11/21/2022 0929   ALT 10 08/14/2017 0811   ALKPHOS 86 11/21/2022 0929   ALKPHOS 73 08/14/2017 0811   BILITOT 0.5 11/21/2022 [id number]  BILITOT 0.62 08/14/2017 0811   GFRNONAA >60 11/21/2022 0929   GFRNONAA >60 04/14/2010 0952   GFRAA >60 05/18/2020 0846   GFRAA >60 04/14/2010 0952       PENDING LABS:   RADIOGRAPHIC STUDIES:  No results found.   PATHOLOGY:     ASSESSMENT and THERAPY PLAN:   Malignant neoplasm of both breasts in female, estrogen receptor negative ([place]) Maxyne is a 72 year old  woman with stage IV breast cancer on treatment with Trastuzumab.   Toxicities:  Tolerating well.  Next echo due 04/2023.  Anxiety-continues on Alprazolam with good tolerance, pmp aware reviewed-no red flags, refills prescribed  Knee pain: continues on percocet as prescribed by Dr. Marcello Moores. She is functional with this and has no pain elsewhere she notes.    Jaimie has no clinical signs of breast cancer progression. She will return in 4 weeks for f/u and her next treatment.    All questions were answered. The patient knows to call the clinic with any problems, questions or concerns. We can certainly see the patient much sooner if necessary.  Total encounter time:30 minutes*in face-to-face visit time, chart review, lab review, care coordination, order entry, and documentation of the encounter time.  Wilber Bihari, NP 11/21/22 6:42 PM Medical Oncology and Hematology [facility] [place], [place] [address] Tel. [phone]    Fax. [phone]  *Total Encounter Time as defined by the Centers for Medicare and Medicaid Services includes, in addition to the face-to-face time of a patient visit (documented in the note above) non-face-to-face time: obtaining and reviewing outside history, ordering and reviewing medications, tests or procedures, care coordination (communications with other health care professionals or caregivers) and documentation in the medical record.

## 2022-11-22 ENCOUNTER — Telehealth: Payer: Self-pay | Admitting: Hematology and Oncology

## 2022-11-22 NOTE — Telephone Encounter (Signed)
Per 3/27 LOS reached out to patient to schedule per IS. Patient aware of date and time of appointment.

## 2022-11-27 ENCOUNTER — Other Ambulatory Visit: Payer: Self-pay

## 2022-12-11 ENCOUNTER — Telehealth: Payer: Self-pay | Admitting: *Deleted

## 2022-12-11 NOTE — Telephone Encounter (Signed)
Received VM from pt.  RN attempt x1 to return call.  No answer, LVM for pt to return call to the office.  °

## 2022-12-13 ENCOUNTER — Telehealth: Payer: Self-pay

## 2022-12-13 NOTE — Telephone Encounter (Signed)
Attempted to reach out to Patient regarding additional prescription receipts needed to meet out of pocket expenses needed in order for [employer] to provide Eliquis to Patient. Patient's daughter in law was to send receipts via email, but receipts have not been received. Left voicemail for Patient to contact office.

## 2022-12-14 NOTE — Progress Notes (Signed)
Patient Care Team: Philemon Kingdom, MD as PCP - General (Internal Medicine) Ranae Pila., MD as Consulting Physician (Pain Medicine) Linda Hedges, MD as Referring Physician (Orthopedic Surgery) Carnella Guadalajara, DO as Consulting Physician (Orthopedic Surgery) Laurey Morale, MD as Consulting Physician (Cardiology) Bensimhon, Bevelyn Buckles, MD as Consulting Physician (Cardiology) Althea Charon, MD (Radiology) Daiva Eves, Lisette Grinder, MD as Consulting Physician (Infectious Diseases) Antony Blackbird, MD as Consulting Physician (Radiation Oncology) Serena Croissant, MD as Consulting Physician (Hematology and Oncology)  DIAGNOSIS: No diagnosis found.  SUMMARY OF ONCOLOGIC HISTORY: Oncology History  Breast cancer metastasized to multiple sites  09/05/2012 - 09/27/2022 Chemotherapy   Patient is on Treatment Plan : BREAST Trastuzumab q28d     02/26/2013 Initial Diagnosis   history of inflammatory right breast cancer metastatic at presentation September 2004 with involvement of liver and bone, HER-2 positive, estrogen and progesterone receptor negative      - 11/2013 Chemotherapy   carboplatin, docetaxel and Herceptin x 6 completed April 2005     Surgery   bilateral mastectomies with bilateral axillary lymph node dissection 12/07/2004, showing             (a) on the right, a mypT1c ypN1 invasive ductal carcinoma, grade 3, estrogen and progesterone receptor negative, HER-2 positive, with an MIB-1 of 31%             (b) on the left, ypT2 ypN1 invasive ductal carcinoma, grade 2, estrogen and progesterone receptor negative, HER-2 positive, with an MIB-1 of 35%.   01/2015 - 02/2015 Radiation Therapy   Adj XRT    - 03/2018 Chemotherapy   Ixampra   10/25/2022 -  Chemotherapy   Patient is on Treatment Plan : BREAST Trastuzumab IV (8/6) or SQ (600) D1 q21d       CHIEF COMPLIANT:   INTERVAL HISTORY: [name] is a   ALLERGIES:  is allergic to penicillins and adhesive  [tape].  MEDICATIONS:  Current Outpatient Medications  Medication Sig Dispense Refill   acetaminophen (TYLENOL) 500 MG tablet Take 1,000 mg by mouth every 6 (six) hours as needed for mild pain or fever.      albuterol (VENTOLIN HFA) 108 (90 Base) MCG/ACT inhaler Inhale 2 puffs into the lungs every 6 (six) hours as needed for wheezing. 1 each 6   ALPRAZolam (XANAX) 1 MG tablet Take 1 tablet (1 mg total) by mouth 3 (three) times daily as needed for anxiety. 60 tablet 3   amLODipine (NORVASC) 10 MG tablet Take 1 tablet (10 mg total) by mouth every morning. 30 tablet 4   apixaban (ELIQUIS) 5 MG TABS tablet Take 1 tablet (5 mg total) by mouth 2 (two) times daily. 180 tablet 3   baclofen (LIORESAL) 10 MG tablet TAKE 1 TABLET BY MOUTH THREE TIMES DAILY AS NEEDED FOR MUSCLE SPASMS 270 tablet 0   carvedilol (COREG) 12.5 MG tablet Take 1 tablet (12.5 mg total) by mouth 2 (two) times daily with a meal. 180 tablet 3   diclofenac sodium (VOLTAREN) 1 % GEL Apply 2 g topically daily as needed (for pain). Apply to knees and shoulders 100 g 6   DULoxetine (CYMBALTA) 20 MG capsule Take 20 mg by mouth daily.     furosemide (LASIX) 40 MG tablet Take 40 mg by mouth as needed.     gabapentin (NEURONTIN) 300 MG capsule TAKE 2 CAPSULES BY MOUTH THREE TIMES DAILY 540 capsule 1   losartan (COZAAR) 100 MG tablet Take 100 mg by mouth  every morning.     Multiple Vitamins-Minerals (ZINC PO) Take by mouth daily.     ondansetron (ZOFRAN) 8 MG tablet Take 1 tablet (8 mg total) by mouth every 8 (eight) hours as needed for nausea or vomiting. 20 tablet 1   oxyCODONE-acetaminophen (PERCOCET) 10-325 MG tablet Take 1 tablet by mouth 4 (four) times daily as needed.     potassium chloride SA (KLOR-CON) 20 MEQ tablet Take 1 tablet (20 mEq total) by mouth daily as needed. With use of lasix 30 tablet 3   predniSONE (DELTASONE) 5 MG tablet Take 5 mg by mouth daily in the afternoon.     spironolactone (ALDACTONE) 25 MG tablet Take 1  tablet daily 90 tablet 3   No current facility-administered medications for this visit.   Facility-Administered Medications Ordered in Other Visits  Medication Dose Route Frequency Provider Last Rate Last Admin   sodium chloride flush (NS) 0.9 % injection 10 mL  10 mL Intravenous PRN Magrinat, Valentino Hue, MD   10 mL at 12/15/15 1200   sodium chloride flush (NS) 0.9 % injection 10 mL  10 mL Intracatheter PRN Magrinat, Valentino Hue, MD   10 mL at 08/14/18 1116   sodium chloride flush (NS) 0.9 % injection 10 mL  10 mL Intracatheter PRN Magrinat, Valentino Hue, MD        PHYSICAL EXAMINATION: ECOG PERFORMANCE STATUS: {CHL ONC ECOG EA:[id number]}  There were no vitals filed for this visit. There were no vitals filed for this visit.  BREAST:*** No palpable masses or nodules in either right or left breasts. No palpable axillary supraclavicular or infraclavicular adenopathy no breast tenderness or nipple discharge. (exam performed in the presence of a chaperone)  LABORATORY DATA:  I have reviewed the data as listed    Latest Ref Rng & Units 11/21/2022    9:29 AM 09/27/2022    8:23 AM 08/30/2022    8:51 AM  CMP  Glucose 70 - 99 mg/dL 81  88  782   BUN 8 - 23 mg/dL Creatinine 0.44 - 1.00 mg/dL 9.56  2.13  0.86   Sodium 135 - 145 mmol/L 142  140  141   Potassium 3.5 - 5.1 mmol/L 4.1  3.6  3.6   Chloride 98 - 111 mmol/L 108  107  107   CO2 22 - 32 mmol/L Calcium 8.9 - 10.3 mg/dL 8.5  9.2  9.2   Total Protein 6.5 - 8.1 g/dL 6.8  7.3  7.4   Total Bilirubin 0.3 - 1.2 mg/dL 0.5  0.4  0.6   Alkaline Phos 38 - 126 U/L 86  72  65   AST 15 - 41 U/L ALT 0 - 44 U/L Lab Results  Component Value Date   WBC 9.7 11/21/2022   HGB 10.2 (L) 11/21/2022   HCT 29.0 (L) 11/21/2022   MCV 81.5 11/21/2022   PLT 204 11/21/2022   NEUTROABS 6.0 11/21/2022    ASSESSMENT & PLAN:  No problem-specific Assessment & Plan notes found for this encounter.    No orders of  the defined types were placed in this encounter.  The patient has a good understanding of the overall plan. she agrees with it. she will call with any problems that may develop before the next visit here. Total time spent: 30 mins including face to  face time and time spent for planning, charting and co-ordination of care   Sherlyn Lick, CMA 12/14/22    I Janan Ridge am acting as a Neurosurgeon for [employer]  ***

## 2022-12-19 ENCOUNTER — Other Ambulatory Visit: Payer: Self-pay | Admitting: *Deleted

## 2022-12-19 ENCOUNTER — Other Ambulatory Visit: Payer: Self-pay

## 2022-12-19 ENCOUNTER — Telehealth: Payer: Self-pay

## 2022-12-19 ENCOUNTER — Inpatient Hospital Stay (HOSPITAL_BASED_OUTPATIENT_CLINIC_OR_DEPARTMENT_OTHER): Payer: Medicare Other | Admitting: Hematology and Oncology

## 2022-12-19 ENCOUNTER — Ambulatory Visit: Payer: Medicare Other

## 2022-12-19 ENCOUNTER — Inpatient Hospital Stay: Payer: Medicare Other | Attending: Oncology

## 2022-12-19 VITALS — BP 164/83 | HR 69 | Temp 97.9°F | Resp 18 | Ht 63.0 in | Wt 234.7 lb

## 2022-12-19 DIAGNOSIS — C50911 Malignant neoplasm of unspecified site of right female breast: Secondary | ICD-10-CM

## 2022-12-19 DIAGNOSIS — I871 Compression of vein: Secondary | ICD-10-CM | POA: Diagnosis not present

## 2022-12-19 DIAGNOSIS — Z5112 Encounter for antineoplastic immunotherapy: Secondary | ICD-10-CM | POA: Insufficient documentation

## 2022-12-19 DIAGNOSIS — Z7901 Long term (current) use of anticoagulants: Secondary | ICD-10-CM | POA: Diagnosis not present

## 2022-12-19 DIAGNOSIS — C50812 Malignant neoplasm of overlapping sites of left female breast: Secondary | ICD-10-CM | POA: Diagnosis not present

## 2022-12-19 DIAGNOSIS — Z171 Estrogen receptor negative status [ER-]: Secondary | ICD-10-CM | POA: Diagnosis not present

## 2022-12-19 MED ORDER — TEMAZEPAM 30 MG PO CAPS: 30.0000 mg | ORAL_CAPSULE | Freq: Every evening | ORAL | 3 refills | Status: DC | PRN

## 2022-12-19 MED ORDER — TRASTUZUMAB-DTTB CHEMO 150 MG IV SOLR
750.0000 mg | Freq: Once | INTRAVENOUS | Status: AC
  Administered 2022-12-19: 750 mg via INTRAVENOUS
  Filled 2022-12-19: qty 35.72

## 2022-12-19 MED ORDER — SODIUM CHLORIDE 0.9% FLUSH
10.0000 mL | INTRAVENOUS | Status: DC | PRN
  Administered 2022-12-19: 10 mL

## 2022-12-19 MED ORDER — ACETAMINOPHEN 325 MG PO TABS
650.0000 mg | ORAL_TABLET | Freq: Once | ORAL | Status: AC
  Administered 2022-12-19: 650 mg via ORAL
  Filled 2022-12-19: qty 2

## 2022-12-19 MED ORDER — ALPRAZOLAM 1 MG PO TABS: 1.0000 mg | ORAL_TABLET | Freq: Three times a day (TID) | ORAL | 3 refills | Status: DC | PRN

## 2022-12-19 MED ORDER — SODIUM CHLORIDE 0.9 % IV SOLN: Freq: Once | INTRAVENOUS | Status: AC

## 2022-12-19 MED ORDER — LORAZEPAM 2 MG/ML IJ SOLN
1.0000 mg | Freq: Once | INTRAMUSCULAR | Status: AC
  Administered 2022-12-19: 1 mg via INTRAVENOUS
  Filled 2022-12-19: qty 1

## 2022-12-19 MED ORDER — DIPHENHYDRAMINE HCL 25 MG PO CAPS
50.0000 mg | ORAL_CAPSULE | Freq: Once | ORAL | Status: AC
  Administered 2022-12-19: 50 mg via ORAL
  Filled 2022-12-19: qty 2

## 2022-12-19 MED ORDER — HEPARIN SOD (PORK) LOCK FLUSH 100 UNIT/ML IV SOLN
500.0000 [IU] | Freq: Once | INTRAVENOUS | Status: AC | PRN
  Administered 2022-12-19: 500 [IU]

## 2022-12-19 NOTE — Assessment & Plan Note (Signed)
2004: Liver. Lung and bone mets Neoadj chemo TCH X 6 completed April 2005 foll by herceptin maintenance   bilateral mastectomies with bilateral axillary lymph node dissection 12/07/2004, showing             (a) on the right, a mypT1c ypN1 invasive ductal carcinoma, grade 3, estrogen and progesterone receptor negative, HER-2 positive, with an MIB-1 of 31%             (b) on the left, ypT2 ypN1 invasive ductal carcinoma, grade 2, estrogen and progesterone receptor negative, HER-2 positive, with an MIB-1 of 35%.   Adj XRT ----------------------------------------------------------------------------------------------------------------------------------------  Current treatment: Herceptin maintenance therapy Plan to continue Herceptin indefinitely every 28 days.   CT chest abdomen and pelvis 04/04/2022: New soft tissue nodule suggestive of a postoperative fluid collection measuring 0.9 cm concerning for local recurrence.  No other evidence of metastatic disease. Surgical excision 05/10/2022: No evidence of malignancy.  Fat necrosis/seroma   SVC syndrome: On lifelong anticoagulation.    Bone scan 09/26/2021: No bone metastases ECHO: 10/31/22: EF 55% We will schedule another CT scan  Return to clinic for monthly Herceptin infusions

## 2022-12-19 NOTE — Patient Instructions (Signed)
[facility]  Discharge Instructions: Thank you for choosing [facility] to provide your oncology and hematology care.   If you have a lab appointment with the Cancer Center, please go directly to the Cancer Center and check in at the registration area.   Wear comfortable clothing and clothing appropriate for easy access to any Portacath or PICC line.   We strive to give you quality time with your provider. You may need to reschedule your appointment if you arrive late (15 or more minutes).  Arriving late affects you and other patients whose appointments are after yours.  Also, if you miss three or more appointments without notifying the office, you may be dismissed from the clinic at the provider's discretion.      For prescription refill requests, have your pharmacy contact our office and allow 72 hours for refills to be completed.    Today you received the following chemotherapy and/or immunotherapy agents: Trastuzumab      To help prevent nausea and vomiting after your treatment, we encourage you to take your nausea medication as directed.  BELOW ARE SYMPTOMS THAT SHOULD BE REPORTED IMMEDIATELY: *FEVER GREATER THAN 100.4 F (38 C) OR HIGHER *CHILLS OR SWEATING *NAUSEA AND VOMITING THAT IS NOT CONTROLLED WITH YOUR NAUSEA MEDICATION *UNUSUAL SHORTNESS OF BREATH *UNUSUAL BRUISING OR BLEEDING *URINARY PROBLEMS (pain or burning when urinating, or frequent urination) *BOWEL PROBLEMS (unusual diarrhea, constipation, pain near the anus) TENDERNESS IN MOUTH AND THROAT WITH OR WITHOUT PRESENCE OF ULCERS (sore throat, sores in mouth, or a toothache) UNUSUAL RASH, SWELLING OR PAIN  UNUSUAL VAGINAL DISCHARGE OR ITCHING   Items with * indicate a potential emergency and should be followed up as soon as possible or go to the Emergency Department if any problems should occur.  Please show the CHEMOTHERAPY ALERT CARD or IMMUNOTHERAPY ALERT CARD at  check-in to the Emergency Department and triage nurse.  Should you have questions after your visit or need to cancel or reschedule your appointment, please contact [facility]  Dept: [phone]  and follow the prompts.  Office hours are 8:00 a.m. to 4:30 p.m. Monday - Friday. Please note that voicemails left after 4:00 p.m. may not be returned until the following business day.  We are closed weekends and major holidays. You have access to a nurse at all times for urgent questions. Please call the main number to the clinic Dept: [phone] and follow the prompts.   For any non-urgent questions, you may also contact your provider using MyChart. We now offer e-Visits for anyone 18 and older to request care online for non-urgent symptoms. For details visit [email or web address].   Also download the MyChart app! Go to the app store, search "MyChart", open the app, select [facility], and log in with your MyChart username and password.  

## 2022-12-19 NOTE — Telephone Encounter (Signed)
Patient provided additional out of pocket expense receipts for household prescriptions and Social Security Income Statements for household. This information was faxed to [facility] [employer] at [phone]. Account number [id number]. Requested expedited shipping for Eliquis when renewal application is approved.

## 2022-12-19 NOTE — Progress Notes (Signed)
Per MD pt needing CT CAP.  Order currently placed for GI.  Orders replaced for imaging location of [facility].  Pt provided number for central scheduling.

## 2022-12-26 ENCOUNTER — Other Ambulatory Visit: Payer: Self-pay

## 2022-12-26 NOTE — Progress Notes (Signed)
Office Visit Note  Patient: [name]             Date of Birth: 06/09/1951           MRN: [id number]             PCP: Philemon Kingdom, MD Referring: Philemon Kingdom, MD Visit Date: 01/08/2023 Occupation: @GUAROCC @  Subjective:  Pain in multiple joints  History of Present Illness: [name] is a 72 y.o. female with history of osteoarthritis and degenerative disc disease.  She states that few months back she fell and landed on her left arm.  She was seen by the orthopedic surgeon and was sent to physical therapy.  She states she later had a PET scan through her oncologist which showed humerus fracture.  It is too late to do any treatment.  Patient states she continues to have limited mobility and some discomfort in the left shoulder.  She continues to have some discomfort in her hands and her knees.  The neck and lower back pain is tolerable.  She continues to take oxycodone on as needed basis for pain relief.  She ambulates with the help of a cane.  Her left chest wall wound has completely healed now.    Activities of Daily Living:  Patient reports morning stiffness for 30 minutes.   Patient Reports nocturnal pain.  Difficulty dressing/grooming: Reports Difficulty climbing stairs: Reports Difficulty getting out of chair: Reports Difficulty using hands for taps, buttons, cutlery, and/or writing: Reports  Review of Systems  Constitutional:  Positive for fatigue.  HENT:  Negative for mouth sores and mouth dryness.   Eyes:  Negative for dryness.  Respiratory:  Positive for shortness of breath.   Cardiovascular:  Positive for chest pain and palpitations.       Chest wall pain  Gastrointestinal:  Negative for blood in stool, constipation and diarrhea.  Endocrine: Positive for increased urination.  Genitourinary:  Positive for involuntary urination.  Musculoskeletal:  Positive for joint pain, gait problem, joint pain, myalgias, morning stiffness, muscle tenderness and  myalgias. Negative for joint swelling and muscle weakness.  Skin:  Negative for color change, hair loss and sensitivity to sunlight.  Allergic/Immunologic: Negative for susceptible to infections.  Neurological:  Negative for dizziness and headaches.  Hematological:  Negative for swollen glands.  Psychiatric/Behavioral:  Positive for depressed mood and sleep disturbance. The patient is nervous/anxious.     PMFS History:  Patient Active Problem List   Diagnosis Date Noted   Goals of care, counseling/discussion 08/13/2019   Port-A-Cath in place 08/14/2018   Port catheter in place 01/26/2016   Obesity, morbid, BMI 50 or higher (HCC) 09/22/2015   Malignant neoplasm of both breasts in female, estrogen receptor negative (HCC) 09/22/2015   Coagulopathy (HCC) 09/22/2015   Peripheral neuropathy 09/22/2015   Hot flashes 03/24/2015   Cellulitis of chest wall 06/16/2014   Right shoulder pain 06/12/2013   Neck pain 06/12/2013   Gram-negative bacteremia (HCC) 03/21/2013   Anemia of chronic disease 03/21/2013   Breast cancer metastasized to multiple sites (HCC) 02/26/2013   CHF (congestive heart failure) (HCC) 09/11/2012   SVC syndrome 09/05/2012   Neuropathy 09/05/2012   Hypertension 09/05/2012   Anxiety 09/05/2012    Past Medical History:  Diagnosis Date   Anemia    pt pt report   Anxiety    Breast cancer (HCC)    mets to liver and lung   Breast cancer metastasized to multiple sites (HCC) 02/26/2013  Cellulitis    CHF (congestive heart failure) (HCC)    History of chemotherapy 09/2004   taxotere/herceptin/carboplatin   Hypertension    Neuropathy    Radiation 07/31/2006   left upper chest   Radiation 06/17/2006-06/27/2006   6480 cGy bilat. chest wall   SVC syndrome    Thrombosis     Family History  Problem Relation Age of Onset   Heart failure Father    Cancer Father        Prostate cancer   Heart failure Brother    Cancer Brother        Prostate cancer   Diabetes  Maternal Aunt    Past Surgical History:  Procedure Laterality Date   ANKLE SURGERY Left    BACK SURGERY     CHOLECYSTECTOMY  08/28/1987   MASS EXCISION Left 05/10/2022   Procedure: EXCISION OF LEFT CHEST WALL MASS;  Surgeon: Harriette Bouillon, MD;  Location: [facility] OR;  Service: General;  Laterality: Left;   MASTECTOMY Bilateral    w/ lymph node removal per patient   PERIPHERALLY INSERTED CENTRAL CATHETER INSERTION     PICC LINE INSERTION     PICC LINE REMOVAL ([facility] HX)     SURGICAL EXCISION OF EXCESSIVE SKIN     TUBAL LIGATION  08/27/1984   Social History   Social History Narrative   Not on file   Immunization History  Administered Date(s) Administered   Fluad Quad(high Dose 65+) 05/21/2019, 05/18/2020, 06/22/2021   Influenza, High Dose Seasonal PF 06/19/2017   Influenza,inj,Quad PF,6+ Mos 06/20/2016, 07/16/2018   Moderna Covid-19 Vaccine Bivalent Booster 69yrs & up 10/22/2019, 11/23/2019, 04/29/2020   Moderna SARS-COV2 Booster Vaccination 02/04/2021   Pneumococcal Conjugate-13 02/06/2017     Objective: Vital Signs: BP 125/72 (BP Location: Right Arm, Patient Position: Sitting, Cuff Size: Normal)   Pulse 64   Resp 14   Ht 5' 3.5" (1.613 m)   Wt 241 lb (109.3 kg)   BMI 42.02 kg/m    Physical Exam Vitals and nursing note reviewed.  Constitutional:      Appearance: She is well-developed.  HENT:     Head: Normocephalic and atraumatic.  Eyes:     Conjunctiva/sclera: Conjunctivae normal.  Cardiovascular:     Rate and Rhythm: Normal rate and regular rhythm.     Heart sounds: Normal heart sounds.  Pulmonary:     Effort: Pulmonary effort is normal.     Breath sounds: Normal breath sounds.  Abdominal:     General: Bowel sounds are normal.     Palpations: Abdomen is soft.  Musculoskeletal:     Cervical back: Normal range of motion.  Lymphadenopathy:     Cervical: No cervical adenopathy.  Skin:    General: Skin is warm and dry.     Capillary Refill: Capillary refill takes  less than 2 seconds.  Neurological:     Mental Status: She is alert and oriented to person, place, and time.  Psychiatric:        Behavior: Behavior normal.      Musculoskeletal Exam: Patient was apical spine had limited lateral rotation.  There was no tenderness over thoracic or lumbar spine.  Shoulder joints had abduction limited 220 degrees.  Elbow joints were in good range of motion.  Wrist joints and MCPs were in good range of motion with no synovitis.  She had no tenderness over PIP or DIP joints.  Hip joints were difficult to assess.  Knee joints were in good range of motion with  painful range of motion but no tenderness.  There was no tenderness over ankles or MTPs.  CDAI Exam: CDAI Score: -- Patient Global: --; Provider Global: -- Swollen: --; Tender: -- Joint Exam 01/08/2023   No joint exam has been documented for this visit   There is currently no information documented on the homunculus. Go to the Rheumatology activity and complete the homunculus joint exam.  Investigation: No additional findings.  Imaging: CT CHEST ABDOMEN PELVIS W CONTRAST  Result Date: 01/02/2023 CLINICAL DATA:  Carcinoma of right breast. Metastatic disease evaluation. Chemotherapy and radiation therapy complete. Chest pain. Cough. Short of breath on exertion. Status post left chest wall mass excision on 05/10/2022. * Tracking Code: BO * EXAM: CT CHEST, ABDOMEN, AND PELVIS WITH CONTRAST TECHNIQUE: Multidetector CT imaging of the chest, abdomen and pelvis was performed following the standard protocol during bolus administration of intravenous contrast. RADIATION DOSE REDUCTION: This exam was performed according to the departmental dose-optimization program which includes automated exposure control, adjustment of the mA and/or kV according to patient size and/or use of iterative reconstruction technique. CONTRAST:  OMNIPAQUE IOHEXOL 300 MG/ML  SOLN COMPARISON:  04/03/2022 FINDINGS: CT CHEST FINDINGS  Cardiovascular: Right Port-A-Cath tip high right atrium. Aortic atherosclerosis. Mild cardiomegaly, without pericardial effusion. No central pulmonary embolism, on this non-dedicated study. Mediastinum/Nodes: No supraclavicular adenopathy. Left axillary node dissection. No axillary or subpectoral adenopathy. No mediastinal or hilar adenopathy. No internal mammary adenopathy. Lungs/Pleura: No pleural fluid. Suspect minimal anterior right upper lobe radiation fibrosis. Musculoskeletal: Interval left chest wall surgical changes, with presumed resection of previously described nodules and fluid collection. Subcutaneous nodule anterior and right of the sternum measures 4 mm on 19/2 and is felt to be similar on the prior. No acute osseous abnormality. CT ABDOMEN PELVIS FINDINGS Hepatobiliary: Tiny low-density lesion in segment 4B is likely a cyst and similar. Cholecystectomy. Similar mild intrahepatic biliary duct dilatation. Normal common duct caliber. Pancreas: Normal, without mass or ductal dilatation. Spleen: Normal in size, without focal abnormality. Adrenals/Urinary Tract: Normal adrenal glands. Normal left kidney. Interpolar right renal 3.4 cm cyst . In the absence of clinically indicated signs/symptoms require(s) no independent follow-up. No hydronephrosis. Normal urinary bladder. Stomach/Bowel: Normal stomach, without wall thickening. Scattered colonic diverticula. Colonic stool burden suggests constipation. Normal terminal ileum. Normal small bowel. Vascular/Lymphatic: Aortic atherosclerosis. No abdominopelvic adenopathy. Reproductive: Calcified uterine fibroids. Again identified is a right pelvic calcified lesion which is most likely an exophytic fibroid at 3.5 cm, similar. Other: No significant free fluid. Moderate pelvic floor laxity. No evidence of omental or peritoneal disease. Musculoskeletal: No acute osseous abnormality. IMPRESSION: 1. Interval surgical changes about the anterior left chest wall with  presumed resection of previously described nodules and fluid collection. 2. No findings of metastatic disease in the chest, abdomen, or pelvis. 3. Subtle subcutaneous nodule superficial the right side of the sternum is felt to be similar and nonspecific. Recommend attention on follow-up. 4. Cholecystectomy with similar mild intrahepatic biliary duct dilatation. 5. Fibroid uterus with a stable calcified right pelvic mass, favoring exophytic fibroid. 6.  Aortic Atherosclerosis (ICD10-I70.0). Electronically Signed   By: Jeronimo Greaves M.D.   On: 01/02/2023 11:05    Recent Labs: Lab Results  Component Value Date   WBC 9.7 11/21/2022   HGB 10.2 (L) 11/21/2022   PLT 204 11/21/2022   NA 142 11/21/2022   K 4.1 11/21/2022   CL 108 11/21/2022   CO2 30 11/21/2022   GLUCOSE 81 11/21/2022   BUN 24 (H) 11/21/2022  CREATININE 0.81 11/21/2022   BILITOT 0.5 11/21/2022   ALKPHOS 86 11/21/2022   AST 9 (L) 11/21/2022   ALT 9 11/21/2022   PROT 6.8 11/21/2022   ALBUMIN 3.4 (L) 11/21/2022   CALCIUM 8.5 (L) 11/21/2022   GFRAA >60 05/18/2020    Speciality Comments: No specialty comments available.  Procedures:  No procedures performed Allergies: Penicillins and Adhesive [tape]   Assessment / Plan:     Visit Diagnoses: Primary osteoarthritis of both hands -patient continues to have some stiffness and discomfort in her hands.  No synovitis was noted.  X-rays of both hands were consistent with osteoarthritis on 01/03/2022.  She had no erosive changes.  Joint protection muscle strengthening was discussed.  Primary osteoarthritis of both knees -she has severe osteoarthritis involving bilateral knee joints based on the x-rays and May 2023.  History of pain in bilateral knee joints for several years.  History of cortisone and Visco supplement injections in the past.  Patient had no benefit from injections.  She is not interested in total knee replacement.  She ambulates with the help of a cane.  Chronic pain of  both shoulders-she continues to have discomfort in her bilateral shoulders.  She states she fell a few months back and had humerus fracture.  She had limited range of motion of bilateral shoulders.  She is followed by orthopedics.  Patient recalls having physical therapy.  DDD (degenerative disc disease), cervical -she had limited lateral rotation of the cervical spine with no discomfort.  X-rays showed multilevel spondylosis and facet joint arthropathy.  Positive anti-CCP test - 09/19/2021 anti-CCP 38.  Patient had no synovitis on the examination.  Positive ANA (antinuclear antibody) - ANA low titer positive, double-stranded DNA positive (RNP, SSA, SSB, Smith negative).  She had no clinical features of systemic lupus.  No further workup is needed.  Patient was advised to contact us if she develops any symptoms.  Other medical problems are listed as follows:  Coagulopathy (HCC) - She is on Coumadin.  Anticardiolipin antibodies and beta-2 GP 1 antibodies were negative.  Chronic diastolic congestive heart failure (HCC)  SVC syndrome  Anemia of chronic disease  Hypertension due to endocrine disorder  Carcinoma of right breast metastatic to multiple sites (HCC) - 2004,Stage 4, bilateral mastectomy, s/p CTX, RTX  Port-A-Cath in place  History of anxiety  Gram-negative bacteremia (HCC)  Neuropathy  Orders: No orders of the defined types were placed in this encounter.  No orders of the defined types were placed in this encounter.    Follow-Up Instructions: Return in about 1 year (around 01/08/2024) for Osteoarthritis.   Pollyann Savoy, MD  Note - This record has been created using Animal nutritionist.  Chart creation errors have been sought, but may not always  have been located. Such creation errors do not reflect on  the standard of medical care.

## 2022-12-27 ENCOUNTER — Telehealth: Payer: Self-pay

## 2022-12-27 NOTE — Telephone Encounter (Signed)
Notified Patient of approval of Patient Assistance Application for Eliquis. Medication will be shipped as soon as Pharmacist with [employer] [facility] reviews approval. Advised Patient that overnight shipping was requested. No other needs or concerns voiced at this time.

## 2022-12-27 NOTE — Progress Notes (Signed)
Spoke with [employer] Representative Darrell Jewel regarding Patient Assistance Application for Eliquis for Patient. Application has been approved and medication is scheduled to be shipped as soon as Pharmacist reviews approval. Requested that medication be shipped overnight if possible.

## 2022-12-31 ENCOUNTER — Encounter (HOSPITAL_COMMUNITY): Payer: Self-pay

## 2022-12-31 ENCOUNTER — Ambulatory Visit (HOSPITAL_COMMUNITY)
Admission: RE | Admit: 2022-12-31 | Discharge: 2022-12-31 | Disposition: A | Discharge status: Home / Self Care | Payer: Medicare Other | Source: Ambulatory Visit | Attending: Hematology and Oncology | Admitting: Hematology and Oncology

## 2022-12-31 DIAGNOSIS — C3411 Malignant neoplasm of upper lobe, right bronchus or lung: Secondary | ICD-10-CM | POA: Diagnosis not present

## 2022-12-31 DIAGNOSIS — C50911 Malignant neoplasm of unspecified site of right female breast: Secondary | ICD-10-CM | POA: Diagnosis not present

## 2022-12-31 MED ORDER — SODIUM CHLORIDE (PF) 0.9 % IJ SOLN
INTRAMUSCULAR | Status: AC
  Filled 2022-12-31: qty 50

## 2022-12-31 MED ORDER — IOHEXOL 300 MG/ML  SOLN
100.0000 mL | Freq: Once | INTRAMUSCULAR | Status: AC | PRN
  Administered 2022-12-31: 100 mL via INTRAVENOUS

## 2023-01-08 ENCOUNTER — Encounter: Payer: Self-pay | Admitting: Rheumatology

## 2023-01-08 ENCOUNTER — Ambulatory Visit: Payer: Medicare Other | Attending: Rheumatology | Admitting: Rheumatology

## 2023-01-08 VITALS — BP 125/72 | HR 64 | Resp 14 | Ht 63.5 in | Wt 241.0 lb

## 2023-01-08 DIAGNOSIS — M25512 Pain in left shoulder: Secondary | ICD-10-CM | POA: Diagnosis not present

## 2023-01-08 DIAGNOSIS — M170 Bilateral primary osteoarthritis of knee: Secondary | ICD-10-CM

## 2023-01-08 DIAGNOSIS — Z95828 Presence of other vascular implants and grafts: Secondary | ICD-10-CM

## 2023-01-08 DIAGNOSIS — D638 Anemia in other chronic diseases classified elsewhere: Secondary | ICD-10-CM

## 2023-01-08 DIAGNOSIS — R7881 Bacteremia: Secondary | ICD-10-CM

## 2023-01-08 DIAGNOSIS — I871 Compression of vein: Secondary | ICD-10-CM

## 2023-01-08 DIAGNOSIS — D689 Coagulation defect, unspecified: Secondary | ICD-10-CM | POA: Diagnosis not present

## 2023-01-08 DIAGNOSIS — M5030 Other cervical disc degeneration, unspecified cervical region: Secondary | ICD-10-CM

## 2023-01-08 DIAGNOSIS — I5032 Chronic diastolic (congestive) heart failure: Secondary | ICD-10-CM

## 2023-01-08 DIAGNOSIS — M19042 Primary osteoarthritis, left hand: Secondary | ICD-10-CM | POA: Insufficient documentation

## 2023-01-08 DIAGNOSIS — R7689 Other specified abnormal immunological findings in serum: Secondary | ICD-10-CM

## 2023-01-08 DIAGNOSIS — R7681 Abnormal rheumatoid factor and anti-citrullinated protein antibody without rheumatoid arthritis: Secondary | ICD-10-CM

## 2023-01-08 DIAGNOSIS — C50911 Malignant neoplasm of unspecified site of right female breast: Secondary | ICD-10-CM | POA: Diagnosis not present

## 2023-01-08 DIAGNOSIS — G8929 Other chronic pain: Secondary | ICD-10-CM | POA: Diagnosis not present

## 2023-01-08 DIAGNOSIS — G629 Polyneuropathy, unspecified: Secondary | ICD-10-CM

## 2023-01-08 DIAGNOSIS — R700 Elevated erythrocyte sedimentation rate: Secondary | ICD-10-CM

## 2023-01-08 DIAGNOSIS — I152 Hypertension secondary to endocrine disorders: Secondary | ICD-10-CM | POA: Diagnosis not present

## 2023-01-08 DIAGNOSIS — M19041 Primary osteoarthritis, right hand: Secondary | ICD-10-CM

## 2023-01-08 DIAGNOSIS — R768 Other specified abnormal immunological findings in serum: Secondary | ICD-10-CM | POA: Insufficient documentation

## 2023-01-08 DIAGNOSIS — Z8659 Personal history of other mental and behavioral disorders: Secondary | ICD-10-CM | POA: Diagnosis not present

## 2023-01-08 DIAGNOSIS — M25511 Pain in right shoulder: Secondary | ICD-10-CM | POA: Insufficient documentation

## 2023-01-09 ENCOUNTER — Other Ambulatory Visit: Payer: Self-pay

## 2023-01-15 NOTE — Progress Notes (Signed)
Patient Care Team: Philemon Kingdom, MD as PCP - General (Internal Medicine) Ranae Pila., MD as Consulting Physician (Pain Medicine) Linda Hedges, MD as Referring Physician (Orthopedic Surgery) Carnella Guadalajara, DO as Consulting Physician (Orthopedic Surgery) Laurey Morale, MD as Consulting Physician (Cardiology) Bensimhon, Bevelyn Buckles, MD as Consulting Physician (Cardiology) Althea Charon, MD (Radiology) Daiva Eves, Lisette Grinder, MD as Consulting Physician (Infectious Diseases) Antony Blackbird, MD as Consulting Physician (Radiation Oncology) Serena Croissant, MD as Consulting Physician (Hematology and Oncology)  DIAGNOSIS: No diagnosis found.  SUMMARY OF ONCOLOGIC HISTORY: Oncology History  Breast cancer metastasized to multiple sites [facility])  09/05/2012 - 09/27/2022 Chemotherapy   Patient is on Treatment Plan : BREAST Trastuzumab q28d     02/26/2013 Initial Diagnosis   history of inflammatory right breast cancer metastatic at presentation September 2004 with involvement of liver and bone, HER-2 positive, estrogen and progesterone receptor negative      - 11/2013 Chemotherapy   carboplatin, docetaxel and Herceptin x 6 completed April 2005     Surgery   bilateral mastectomies with bilateral axillary lymph node dissection 12/07/2004, showing             (a) on the right, a mypT1c ypN1 invasive ductal carcinoma, grade 3, estrogen and progesterone receptor negative, HER-2 positive, with an MIB-1 of 31%             (b) on the left, ypT2 ypN1 invasive ductal carcinoma, grade 2, estrogen and progesterone receptor negative, HER-2 positive, with an MIB-1 of 35%.   01/2015 - 02/2015 Radiation Therapy   Adj XRT    - 03/2018 Chemotherapy   Ixampra   10/25/2022 -  Chemotherapy   Patient is on Treatment Plan : BREAST Trastuzumab IV (8/6) or SQ (600) D1 q21d       CHIEF COMPLIANT: Herceptin maintenance therapy   INTERVAL HISTORY: [name] is a 72 y.o. with the above-mentioned  history of metastatic cancer currently on treatment with Herceptin.  She presents to the clinic for a follow-up.    ALLERGIES:  is allergic to penicillins and adhesive [tape].  MEDICATIONS:  Current Outpatient Medications  Medication Sig Dispense Refill   acetaminophen (TYLENOL) 500 MG tablet Take 1,000 mg by mouth every 6 (six) hours as needed for mild pain or fever.      albuterol (VENTOLIN HFA) 108 (90 Base) MCG/ACT inhaler Inhale 2 puffs into the lungs every 6 (six) hours as needed for wheezing. 1 each 6   ALPRAZolam (XANAX) 1 MG tablet Take 1 tablet (1 mg total) by mouth 3 (three) times daily as needed for anxiety. 60 tablet 3   amLODipine (NORVASC) 10 MG tablet Take 1 tablet (10 mg total) by mouth every morning. 30 tablet 4   apixaban (ELIQUIS) 5 MG TABS tablet Take 1 tablet (5 mg total) by mouth 2 (two) times daily. 180 tablet 3   baclofen (LIORESAL) 10 MG tablet TAKE 1 TABLET BY MOUTH THREE TIMES DAILY AS NEEDED FOR MUSCLE SPASMS 270 tablet 0   carvedilol (COREG) 12.5 MG tablet Take 1 tablet (12.5 mg total) by mouth 2 (two) times daily with a meal. 180 tablet 3   diclofenac sodium (VOLTAREN) 1 % GEL Apply 2 g topically daily as needed (for pain). Apply to knees and shoulders 100 g 6   DULoxetine (CYMBALTA) 20 MG capsule Take 20 mg by mouth daily. (Patient not taking: Reported on 01/08/2023)     DULoxetine (CYMBALTA) 60 MG capsule Take 60 mg by mouth  daily.     furosemide (LASIX) 40 MG tablet Take 40 mg by mouth as needed.     gabapentin (NEURONTIN) 300 MG capsule TAKE 2 CAPSULES BY MOUTH THREE TIMES DAILY 540 capsule 1   losartan (COZAAR) 100 MG tablet Take 100 mg by mouth every morning.     Multiple Vitamins-Minerals (ZINC PO) Take by mouth daily.     ondansetron (ZOFRAN) 8 MG tablet Take 1 tablet (8 mg total) by mouth every 8 (eight) hours as needed for nausea or vomiting. 20 tablet 1   oxyCODONE-acetaminophen (PERCOCET) 10-325 MG tablet Take 1 tablet by mouth 4 (four) times daily as  needed.     potassium chloride SA (KLOR-CON) 20 MEQ tablet Take 1 tablet (20 mEq total) by mouth daily as needed. With use of lasix 30 tablet 3   predniSONE (DELTASONE) 5 MG tablet Take 5 mg by mouth daily in the afternoon.     spironolactone (ALDACTONE) 25 MG tablet Take 1 tablet daily 90 tablet 3   temazepam (RESTORIL) 30 MG capsule Take 1 capsule (30 mg total) by mouth at bedtime as needed for sleep. 30 capsule 3   No current facility-administered medications for this visit.   Facility-Administered Medications Ordered in Other Visits  Medication Dose Route Frequency Provider Last Rate Last Admin   sodium chloride flush (NS) 0.9 % injection 10 mL  10 mL Intravenous PRN Magrinat, Valentino Hue, MD   10 mL at 12/15/15 1200   sodium chloride flush (NS) 0.9 % injection 10 mL  10 mL Intracatheter PRN Magrinat, Valentino Hue, MD   10 mL at 08/14/18 1116   sodium chloride flush (NS) 0.9 % injection 10 mL  10 mL Intracatheter PRN Magrinat, Valentino Hue, MD        PHYSICAL EXAMINATION: ECOG PERFORMANCE STATUS: {CHL ONC ECOG WU:[id number]}  There were no vitals filed for this visit. There were no vitals filed for this visit.  BREAST:*** No palpable masses or nodules in either right or left breasts. No palpable axillary supraclavicular or infraclavicular adenopathy no breast tenderness or nipple discharge. (exam performed in the presence of a chaperone)  LABORATORY DATA:  I have reviewed the data as listed    Latest Ref Rng & Units 11/21/2022    9:29 AM 09/27/2022    8:23 AM 08/30/2022    8:51 AM  CMP  Glucose 70 - 99 mg/dL 81  88  956   BUN 8 - 23 mg/dL 24  17  10    Creatinine 0.44 - 1.00 mg/dL 2.13  0.86  5.78   Sodium 135 - 145 mmol/L 142  140  141   Potassium 3.5 - 5.1 mmol/L 4.1  3.6  3.6   Chloride 98 - 111 mmol/L 108  107  107   CO2 22 - 32 mmol/L 30  28  25    Calcium 8.9 - 10.3 mg/dL 8.5  9.2  9.2   Total Protein 6.5 - 8.1 g/dL 6.8  7.3  7.4   Total Bilirubin 0.3 - 1.2 mg/dL 0.5  0.4  0.6    Alkaline Phos 38 - 126 U/L 86  72  65   AST 15 - 41 U/L 9  13  18    ALT 0 - 44 U/L 9  7  9      Lab Results  Component Value Date   WBC 9.7 11/21/2022   HGB 10.2 (L) 11/21/2022   HCT 29.0 (L) 11/21/2022   MCV 81.5 11/21/2022   PLT 204 11/21/2022  NEUTROABS 6.0 11/21/2022    ASSESSMENT & PLAN:  No problem-specific Assessment & Plan notes found for this encounter.    No orders of the defined types were placed in this encounter.  The patient has a good understanding of the overall plan. she agrees with it. she will call with any problems that may develop before the next visit here. Total time spent: 30 mins including face to face time and time spent for planning, charting and co-ordination of care   Sherlyn Lick, CMA 01/15/23    I Janan Ridge am acting as a Neurosurgeon for [employer]  ***

## 2023-01-16 ENCOUNTER — Inpatient Hospital Stay: Payer: Medicare Other

## 2023-01-16 ENCOUNTER — Inpatient Hospital Stay: Payer: Medicare Other | Attending: Oncology | Admitting: Hematology and Oncology

## 2023-01-16 ENCOUNTER — Other Ambulatory Visit: Payer: Self-pay

## 2023-01-16 VITALS — BP 116/76 | HR 59 | Temp 98.9°F | Resp 16

## 2023-01-16 VITALS — BP 138/65 | HR 62 | Temp 98.1°F | Resp 17 | Wt 248.3 lb

## 2023-01-16 DIAGNOSIS — C7800 Secondary malignant neoplasm of unspecified lung: Secondary | ICD-10-CM | POA: Diagnosis not present

## 2023-01-16 DIAGNOSIS — Z95828 Presence of other vascular implants and grafts: Secondary | ICD-10-CM

## 2023-01-16 DIAGNOSIS — Z7901 Long term (current) use of anticoagulants: Secondary | ICD-10-CM | POA: Diagnosis not present

## 2023-01-16 DIAGNOSIS — I871 Compression of vein: Secondary | ICD-10-CM | POA: Diagnosis not present

## 2023-01-16 DIAGNOSIS — C50919 Malignant neoplasm of unspecified site of unspecified female breast: Secondary | ICD-10-CM

## 2023-01-16 DIAGNOSIS — C50911 Malignant neoplasm of unspecified site of right female breast: Secondary | ICD-10-CM | POA: Insufficient documentation

## 2023-01-16 DIAGNOSIS — C787 Secondary malignant neoplasm of liver and intrahepatic bile duct: Secondary | ICD-10-CM | POA: Insufficient documentation

## 2023-01-16 DIAGNOSIS — Z5112 Encounter for antineoplastic immunotherapy: Secondary | ICD-10-CM | POA: Diagnosis not present

## 2023-01-16 DIAGNOSIS — C7951 Secondary malignant neoplasm of bone: Secondary | ICD-10-CM | POA: Insufficient documentation

## 2023-01-16 DIAGNOSIS — C50812 Malignant neoplasm of overlapping sites of left female breast: Secondary | ICD-10-CM | POA: Diagnosis not present

## 2023-01-16 LAB — CBC WITH DIFFERENTIAL (CANCER CENTER ONLY)
Abs Immature Granulocytes: 0.03 10*3/uL (ref 0.00–0.07)
Basophils Absolute: 0 10*3/uL (ref 0.0–0.1)
Basophils Relative: 1 %
Eosinophils Absolute: 0.2 10*3/uL (ref 0.0–0.5)
Eosinophils Relative: 2 %
HCT: 31.4 % — ABNORMAL LOW (ref 36.0–46.0)
Hemoglobin: 10.7 g/dL — ABNORMAL LOW (ref 12.0–15.0)
Immature Granulocytes: 0 %
Lymphocytes Relative: 22 %
Lymphs Abs: 1.8 10*3/uL (ref 0.7–4.0)
MCH: 28.2 pg (ref 26.0–34.0)
MCHC: 34.1 g/dL (ref 30.0–36.0)
MCV: 82.8 fL (ref 80.0–100.0)
Monocytes Absolute: 0.8 10*3/uL (ref 0.1–1.0)
Monocytes Relative: 10 %
Neutro Abs: 5.3 10*3/uL (ref 1.7–7.7)
Neutrophils Relative %: 65 %
Platelet Count: 261 10*3/uL (ref 150–400)
RBC: 3.79 MIL/uL — ABNORMAL LOW (ref 3.87–5.11)
RDW: 15.5 % (ref 11.5–15.5)
WBC Count: 8.1 10*3/uL (ref 4.0–10.5)
nRBC: 0 % (ref 0.0–0.2)

## 2023-01-16 LAB — CMP (CANCER CENTER ONLY)
ALT: 8 U/L (ref 0–44)
AST: 10 U/L — ABNORMAL LOW (ref 15–41)
Albumin: 3.6 g/dL (ref 3.5–5.0)
Alkaline Phosphatase: 79 U/L (ref 38–126)
Anion gap: 6 (ref 5–15)
BUN: 17 mg/dL (ref 8–23)
CO2: 30 mmol/L (ref 22–32)
Calcium: 8.7 mg/dL — ABNORMAL LOW (ref 8.9–10.3)
Chloride: 106 mmol/L (ref 98–111)
Creatinine: 0.82 mg/dL (ref 0.44–1.00)
GFR, Estimated: >60 mL/min (ref >60)
Glucose, Bld: 94 mg/dL (ref 70–99)
Potassium: 4.7 mmol/L (ref 3.5–5.1)
Sodium: 142 mmol/L (ref 135–145)
Total Bilirubin: 0.7 mg/dL (ref 0.3–1.2)
Total Protein: 6.8 g/dL (ref 6.5–8.1)

## 2023-01-16 MED ORDER — SODIUM CHLORIDE 0.9% FLUSH
10.0000 mL | Freq: Once | INTRAVENOUS | Status: AC
  Administered 2023-01-16: 10 mL via INTRAVENOUS

## 2023-01-16 MED ORDER — SODIUM CHLORIDE 0.9 % IJ SOLN: 10.0000 mL | INTRAMUSCULAR | Status: DC | PRN

## 2023-01-16 MED ORDER — LORAZEPAM 2 MG/ML IJ SOLN
1.0000 mg | Freq: Once | INTRAMUSCULAR | Status: AC
  Administered 2023-01-16: 1 mg via INTRAVENOUS
  Filled 2023-01-16: qty 1

## 2023-01-16 MED ORDER — SODIUM CHLORIDE 0.9 % IV SOLN: Freq: Once | INTRAVENOUS | Status: AC

## 2023-01-16 MED ORDER — TRASTUZUMAB-DTTB CHEMO 150 MG IV SOLR
750.0000 mg | Freq: Once | INTRAVENOUS | Status: AC
  Administered 2023-01-16: 750 mg via INTRAVENOUS
  Filled 2023-01-16: qty 35.72

## 2023-01-16 MED ORDER — DIPHENHYDRAMINE HCL 25 MG PO CAPS
50.0000 mg | ORAL_CAPSULE | Freq: Once | ORAL | Status: AC
  Administered 2023-01-16: 50 mg via ORAL
  Filled 2023-01-16: qty 2

## 2023-01-16 MED ORDER — ACETAMINOPHEN 325 MG PO TABS
650.0000 mg | ORAL_TABLET | Freq: Once | ORAL | Status: AC
  Administered 2023-01-16: 650 mg via ORAL
  Filled 2023-01-16: qty 2

## 2023-01-16 MED ORDER — HEPARIN SOD (PORK) LOCK FLUSH 100 UNIT/ML IV SOLN
500.0000 [IU] | Freq: Once | INTRAVENOUS | Status: AC | PRN
  Administered 2023-01-16: 500 [IU]

## 2023-01-16 MED ORDER — SODIUM CHLORIDE 0.9% FLUSH
10.0000 mL | INTRAVENOUS | Status: DC | PRN
  Administered 2023-01-16: 10 mL

## 2023-01-16 NOTE — Assessment & Plan Note (Addendum)
2004: Liver. Lung and bone mets Neoadj chemo TCH X 6 completed April 2005 foll by herceptin maintenance   bilateral mastectomies with bilateral axillary lymph node dissection 12/07/2004, showing             (a) on the right, a mypT1c ypN1 invasive ductal carcinoma, grade 3, estrogen and progesterone receptor negative, HER-2 positive, with an MIB-1 of 31%             (b) on the left, ypT2 ypN1 invasive ductal carcinoma, grade 2, estrogen and progesterone receptor negative, HER-2 positive, with an MIB-1 of 35%.   Adj XRT ----------------------------------------------------------------------------------------------------------------------------------------  Current treatment: Herceptin maintenance therapy Plan to continue Herceptin indefinitely every 28 days.   CT chest abdomen and pelvis 01/02/2023: Surgical changes left chest wall.  No metastatic disease in chest abdomen pelvis.  Subtle subcutaneous nodule right side of the sternum similar and nonspecific.  Fibroid uterus Surgical excision 05/10/2022: No evidence of malignancy.  Fat necrosis/seroma   SVC syndrome: On lifelong anticoagulation.    Bone scan 09/27/2022: No bone metastases ECHO: 10/31/22: EF 55% (Dr. Shirlee Latch is doing her echocardiograms now every 9 months)   Return to clinic for monthly Herceptin infusions

## 2023-01-16 NOTE — Patient Instructions (Signed)
[facility]  Discharge Instructions: Thank you for choosing [facility] to provide your oncology and hematology care.   If you have a lab appointment with the Cancer Center, please go directly to the Cancer Center and check in at the registration area.   Wear comfortable clothing and clothing appropriate for easy access to any Portacath or PICC line.   We strive to give you quality time with your provider. You may need to reschedule your appointment if you arrive late (15 or more minutes).  Arriving late affects you and other patients whose appointments are after yours.  Also, if you miss three or more appointments without notifying the office, you may be dismissed from the clinic at the provider's discretion.      For prescription refill requests, have your pharmacy contact our office and allow 72 hours for refills to be completed.    Today you received the following chemotherapy and/or immunotherapy agents: Trastuzumab      To help prevent nausea and vomiting after your treatment, we encourage you to take your nausea medication as directed.  BELOW ARE SYMPTOMS THAT SHOULD BE REPORTED IMMEDIATELY: *FEVER GREATER THAN 100.4 F (38 C) OR HIGHER *CHILLS OR SWEATING *NAUSEA AND VOMITING THAT IS NOT CONTROLLED WITH YOUR NAUSEA MEDICATION *UNUSUAL SHORTNESS OF BREATH *UNUSUAL BRUISING OR BLEEDING *URINARY PROBLEMS (pain or burning when urinating, or frequent urination) *BOWEL PROBLEMS (unusual diarrhea, constipation, pain near the anus) TENDERNESS IN MOUTH AND THROAT WITH OR WITHOUT PRESENCE OF ULCERS (sore throat, sores in mouth, or a toothache) UNUSUAL RASH, SWELLING OR PAIN  UNUSUAL VAGINAL DISCHARGE OR ITCHING   Items with * indicate a potential emergency and should be followed up as soon as possible or go to the Emergency Department if any problems should occur.  Please show the CHEMOTHERAPY ALERT CARD or IMMUNOTHERAPY ALERT CARD at  check-in to the Emergency Department and triage nurse.  Should you have questions after your visit or need to cancel or reschedule your appointment, please contact [facility]  Dept: [phone]  and follow the prompts.  Office hours are 8:00 a.m. to 4:30 p.m. Monday - Friday. Please note that voicemails left after 4:00 p.m. may not be returned until the following business day.  We are closed weekends and major holidays. You have access to a nurse at all times for urgent questions. Please call the main number to the clinic Dept: [phone] and follow the prompts.   For any non-urgent questions, you may also contact your provider using MyChart. We now offer e-Visits for anyone 18 and older to request care online for non-urgent symptoms. For details visit [email or web address].   Also download the MyChart app! Go to the app store, search "MyChart", open the app, select [facility], and log in with your MyChart username and password.  

## 2023-01-18 ENCOUNTER — Telehealth: Payer: Self-pay | Admitting: Hematology and Oncology

## 2023-01-18 NOTE — Telephone Encounter (Signed)
Scheduled appointments per WQ. Patient is aware of the made appointments.  

## 2023-02-13 ENCOUNTER — Ambulatory Visit: Payer: Medicare Other | Admitting: Hematology and Oncology

## 2023-02-13 ENCOUNTER — Other Ambulatory Visit: Payer: Self-pay

## 2023-02-13 ENCOUNTER — Inpatient Hospital Stay: Payer: Medicare Other | Attending: Oncology

## 2023-02-13 VITALS — BP 118/72 | HR 58 | Temp 98.2°F | Resp 18 | Wt 248.0 lb

## 2023-02-13 DIAGNOSIS — C50812 Malignant neoplasm of overlapping sites of left female breast: Secondary | ICD-10-CM | POA: Diagnosis not present

## 2023-02-13 DIAGNOSIS — Z5112 Encounter for antineoplastic immunotherapy: Secondary | ICD-10-CM | POA: Insufficient documentation

## 2023-02-13 DIAGNOSIS — C50911 Malignant neoplasm of unspecified site of right female breast: Secondary | ICD-10-CM | POA: Insufficient documentation

## 2023-02-13 MED ORDER — SODIUM CHLORIDE 0.9 % IV SOLN: Freq: Once | INTRAVENOUS | Status: AC

## 2023-02-13 MED ORDER — LORAZEPAM 2 MG/ML IJ SOLN
1.0000 mg | Freq: Once | INTRAMUSCULAR | Status: AC
  Administered 2023-02-13: 1 mg via INTRAVENOUS
  Filled 2023-02-13: qty 1

## 2023-02-13 MED ORDER — ACETAMINOPHEN 325 MG PO TABS
650.0000 mg | ORAL_TABLET | Freq: Once | ORAL | Status: AC
  Administered 2023-02-13: 650 mg via ORAL
  Filled 2023-02-13: qty 2

## 2023-02-13 MED ORDER — DIPHENHYDRAMINE HCL 25 MG PO CAPS
50.0000 mg | ORAL_CAPSULE | Freq: Once | ORAL | Status: AC
  Administered 2023-02-13: 50 mg via ORAL
  Filled 2023-02-13: qty 2

## 2023-02-13 MED ORDER — TRASTUZUMAB-DTTB CHEMO 150 MG IV SOLR
750.0000 mg | Freq: Once | INTRAVENOUS | Status: AC
  Administered 2023-02-13: 750 mg via INTRAVENOUS
  Filled 2023-02-13: qty 20

## 2023-02-13 NOTE — Patient Instructions (Signed)
[facility]  Discharge Instructions: Thank you for choosing [facility] to provide your oncology and hematology care.   If you have a lab appointment with the Cancer Center, please go directly to the Cancer Center and check in at the registration area.   Wear comfortable clothing and clothing appropriate for easy access to any Portacath or PICC line.   We strive to give you quality time with your provider. You may need to reschedule your appointment if you arrive late (15 or more minutes).  Arriving late affects you and other patients whose appointments are after yours.  Also, if you miss three or more appointments without notifying the office, you may be dismissed from the clinic at the provider's discretion.      For prescription refill requests, have your pharmacy contact our office and allow 72 hours for refills to be completed.    Today you received the following chemotherapy and/or immunotherapy agents: Trastuzumab      To help prevent nausea and vomiting after your treatment, we encourage you to take your nausea medication as directed.  BELOW ARE SYMPTOMS THAT SHOULD BE REPORTED IMMEDIATELY: *FEVER GREATER THAN 100.4 F (38 C) OR HIGHER *CHILLS OR SWEATING *NAUSEA AND VOMITING THAT IS NOT CONTROLLED WITH YOUR NAUSEA MEDICATION *UNUSUAL SHORTNESS OF BREATH *UNUSUAL BRUISING OR BLEEDING *URINARY PROBLEMS (pain or burning when urinating, or frequent urination) *BOWEL PROBLEMS (unusual diarrhea, constipation, pain near the anus) TENDERNESS IN MOUTH AND THROAT WITH OR WITHOUT PRESENCE OF ULCERS (sore throat, sores in mouth, or a toothache) UNUSUAL RASH, SWELLING OR PAIN  UNUSUAL VAGINAL DISCHARGE OR ITCHING   Items with * indicate a potential emergency and should be followed up as soon as possible or go to the Emergency Department if any problems should occur.  Please show the CHEMOTHERAPY ALERT CARD or IMMUNOTHERAPY ALERT CARD at  check-in to the Emergency Department and triage nurse.  Should you have questions after your visit or need to cancel or reschedule your appointment, please contact [facility]  Dept: [phone]  and follow the prompts.  Office hours are 8:00 a.m. to 4:30 p.m. Monday - Friday. Please note that voicemails left after 4:00 p.m. may not be returned until the following business day.  We are closed weekends and major holidays. You have access to a nurse at all times for urgent questions. Please call the main number to the clinic Dept: [phone] and follow the prompts.   For any non-urgent questions, you may also contact your provider using MyChart. We now offer e-Visits for anyone 18 and older to request care online for non-urgent symptoms. For details visit [email or web address].   Also download the MyChart app! Go to the app store, search "MyChart", open the app, select , and log in with your MyChart username and password.  

## 2023-02-18 DIAGNOSIS — Z79891 Long term (current) use of opiate analgesic: Secondary | ICD-10-CM | POA: Diagnosis not present

## 2023-02-18 DIAGNOSIS — C50911 Malignant neoplasm of unspecified site of right female breast: Secondary | ICD-10-CM | POA: Diagnosis not present

## 2023-02-18 DIAGNOSIS — D6859 Other primary thrombophilia: Secondary | ICD-10-CM | POA: Diagnosis not present

## 2023-02-18 DIAGNOSIS — I5032 Chronic diastolic (congestive) heart failure: Secondary | ICD-10-CM | POA: Diagnosis not present

## 2023-02-18 DIAGNOSIS — M170 Bilateral primary osteoarthritis of knee: Secondary | ICD-10-CM | POA: Diagnosis not present

## 2023-02-18 DIAGNOSIS — R350 Frequency of micturition: Secondary | ICD-10-CM | POA: Diagnosis not present

## 2023-02-18 DIAGNOSIS — C50912 Malignant neoplasm of unspecified site of left female breast: Secondary | ICD-10-CM | POA: Diagnosis not present

## 2023-02-18 DIAGNOSIS — I10 Essential (primary) hypertension: Secondary | ICD-10-CM | POA: Diagnosis not present

## 2023-03-11 NOTE — Progress Notes (Signed)
Patient Care Team: Philemon Kingdom, MD as PCP - General (Internal Medicine) Ranae Pila., MD as Consulting Physician (Pain Medicine) Linda Hedges, MD as Referring Physician (Orthopedic Surgery) Carnella Guadalajara, DO as Consulting Physician (Orthopedic Surgery) Laurey Morale, MD as Consulting Physician (Cardiology) Bensimhon, Bevelyn Buckles, MD as Consulting Physician (Cardiology) Althea Charon, MD (Radiology) Daiva Eves, Lisette Grinder, MD as Consulting Physician (Infectious Diseases) Antony Blackbird, MD as Consulting Physician (Radiation Oncology) Serena Croissant, MD as Consulting Physician (Hematology and Oncology)  DIAGNOSIS: No diagnosis found.  SUMMARY OF ONCOLOGIC HISTORY: Oncology History  Breast cancer metastasized to multiple sites [facility])  09/05/2012 - 09/27/2022 Chemotherapy   Patient is on Treatment Plan : BREAST Trastuzumab q28d     02/26/2013 Initial Diagnosis   history of inflammatory right breast cancer metastatic at presentation September 2004 with involvement of liver and bone, HER-2 positive, estrogen and progesterone receptor negative      - 11/2013 Chemotherapy   carboplatin, docetaxel and Herceptin x 6 completed April 2005     Surgery   bilateral mastectomies with bilateral axillary lymph node dissection 12/07/2004, showing             (a) on the right, a mypT1c ypN1 invasive ductal carcinoma, grade 3, estrogen and progesterone receptor negative, HER-2 positive, with an MIB-1 of 31%             (b) on the left, ypT2 ypN1 invasive ductal carcinoma, grade 2, estrogen and progesterone receptor negative, HER-2 positive, with an MIB-1 of 35%.   01/2015 - 02/2015 Radiation Therapy   Adj XRT    - 03/2018 Chemotherapy   Ixampra   10/25/2022 -  Chemotherapy   Patient is on Treatment Plan : BREAST Trastuzumab IV (8/6) or SQ (600) D1 q21d       CHIEF COMPLIANT: Herceptin maintenance therapy   INTERVAL HISTORY: [name] is a 72 y.o. with the above-mentioned  history of metastatic cancer currently on treatment with Herceptin.  She presents to the clinic for a follow-up.    ALLERGIES:  is allergic to penicillins and adhesive [tape].  MEDICATIONS:  Current Outpatient Medications  Medication Sig Dispense Refill   acetaminophen (TYLENOL) 500 MG tablet Take 1,000 mg by mouth every 6 (six) hours as needed for mild pain or fever.      albuterol (VENTOLIN HFA) 108 (90 Base) MCG/ACT inhaler Inhale 2 puffs into the lungs every 6 (six) hours as needed for wheezing. 1 each 6   ALPRAZolam (XANAX) 1 MG tablet Take 1 tablet (1 mg total) by mouth 3 (three) times daily as needed for anxiety. 60 tablet 3   amLODipine (NORVASC) 10 MG tablet Take 1 tablet (10 mg total) by mouth every morning. 30 tablet 4   apixaban (ELIQUIS) 5 MG TABS tablet Take 1 tablet (5 mg total) by mouth 2 (two) times daily. 180 tablet 3   baclofen (LIORESAL) 10 MG tablet TAKE 1 TABLET BY MOUTH THREE TIMES DAILY AS NEEDED FOR MUSCLE SPASMS 270 tablet 0   carvedilol (COREG) 12.5 MG tablet Take 1 tablet (12.5 mg total) by mouth 2 (two) times daily with a meal. 180 tablet 3   diclofenac sodium (VOLTAREN) 1 % GEL Apply 2 g topically daily as needed (for pain). Apply to knees and shoulders 100 g 6   DULoxetine (CYMBALTA) 20 MG capsule Take 20 mg by mouth daily.     DULoxetine (CYMBALTA) 60 MG capsule Take 60 mg by mouth daily.     furosemide (  LASIX) 40 MG tablet Take 40 mg by mouth as needed.     gabapentin (NEURONTIN) 300 MG capsule TAKE 2 CAPSULES BY MOUTH THREE TIMES DAILY 540 capsule 1   losartan (COZAAR) 100 MG tablet Take 100 mg by mouth every morning.     Multiple Vitamins-Minerals (ZINC PO) Take by mouth daily.     ondansetron (ZOFRAN) 8 MG tablet Take 1 tablet (8 mg total) by mouth every 8 (eight) hours as needed for nausea or vomiting. 20 tablet 1   oxyCODONE-acetaminophen (PERCOCET) 10-325 MG tablet Take 1 tablet by mouth 4 (four) times daily as needed.     potassium chloride SA  (KLOR-CON) 20 MEQ tablet Take 1 tablet (20 mEq total) by mouth daily as needed. With use of lasix 30 tablet 3   predniSONE (DELTASONE) 5 MG tablet Take 5 mg by mouth daily in the afternoon.     spironolactone (ALDACTONE) 25 MG tablet Take 1 tablet daily 90 tablet 3   temazepam (RESTORIL) 30 MG capsule Take 1 capsule (30 mg total) by mouth at bedtime as needed for sleep. 30 capsule 3   No current facility-administered medications for this visit.   Facility-Administered Medications Ordered in Other Visits  Medication Dose Route Frequency Provider Last Rate Last Admin   sodium chloride flush (NS) 0.9 % injection 10 mL  10 mL Intravenous PRN Magrinat, Valentino Hue, MD   10 mL at 12/15/15 1200   sodium chloride flush (NS) 0.9 % injection 10 mL  10 mL Intracatheter PRN Magrinat, Valentino Hue, MD   10 mL at 08/14/18 1116   sodium chloride flush (NS) 0.9 % injection 10 mL  10 mL Intracatheter PRN Magrinat, Valentino Hue, MD        PHYSICAL EXAMINATION: ECOG PERFORMANCE STATUS: {CHL ONC ECOG ZO:[id number]}  There were no vitals filed for this visit. There were no vitals filed for this visit.  BREAST:*** No palpable masses or nodules in either right or left breasts. No palpable axillary supraclavicular or infraclavicular adenopathy no breast tenderness or nipple discharge. (exam performed in the presence of a chaperone)  LABORATORY DATA:  I have reviewed the data as listed    Latest Ref Rng & Units 01/16/2023    8:24 AM 11/21/2022    9:29 AM 09/27/2022    8:23 AM  CMP  Glucose 70 - 99 mg/dL 94  81  88   BUN 8 - 23 mg/dL 17  24  17    Creatinine 0.44 - 1.00 mg/dL 8.11  9.14  7.82   Sodium 135 - 145 mmol/L 142  142  140   Potassium 3.5 - 5.1 mmol/L 4.7  4.1  3.6   Chloride 98 - 111 mmol/L 106  108  107   CO2 22 - 32 mmol/L 30  30  28    Calcium 8.9 - 10.3 mg/dL 8.7  8.5  9.2   Total Protein 6.5 - 8.1 g/dL 6.8  6.8  7.3   Total Bilirubin 0.3 - 1.2 mg/dL 0.7  0.5  0.4   Alkaline Phos 38 - 126 U/L 79  86  72    AST 15 - 41 U/L 10  9  13    ALT 0 - 44 U/L 8  9  7      Lab Results  Component Value Date   WBC 8.1 01/16/2023   HGB 10.7 (L) 01/16/2023   HCT 31.4 (L) 01/16/2023   MCV 82.8 01/16/2023   PLT 261 01/16/2023   NEUTROABS 5.3 01/16/2023  ASSESSMENT & PLAN:  No problem-specific Assessment & Plan notes found for this encounter.    No orders of the defined types were placed in this encounter.  The patient has a good understanding of the overall plan. she agrees with it. she will call with any problems that may develop before the next visit here. Total time spent: 30 mins including face to face time and time spent for planning, charting and co-ordination of care   Sherlyn Lick, CMA 03/11/23    I Janan Ridge am acting as a Neurosurgeon for [employer]  ***

## 2023-03-13 ENCOUNTER — Inpatient Hospital Stay: Payer: Medicare Other | Attending: Oncology

## 2023-03-13 ENCOUNTER — Inpatient Hospital Stay (HOSPITAL_BASED_OUTPATIENT_CLINIC_OR_DEPARTMENT_OTHER): Payer: Medicare Other | Admitting: Hematology and Oncology

## 2023-03-13 ENCOUNTER — Other Ambulatory Visit: Payer: Self-pay

## 2023-03-13 VITALS — BP 115/58 | HR 63 | Temp 97.5°F | Resp 18

## 2023-03-13 VITALS — BP 143/82 | HR 65 | Temp 97.7°F | Resp 18 | Ht 63.5 in | Wt 246.0 lb

## 2023-03-13 DIAGNOSIS — C50911 Malignant neoplasm of unspecified site of right female breast: Secondary | ICD-10-CM

## 2023-03-13 DIAGNOSIS — C50812 Malignant neoplasm of overlapping sites of left female breast: Secondary | ICD-10-CM | POA: Insufficient documentation

## 2023-03-13 DIAGNOSIS — Z5112 Encounter for antineoplastic immunotherapy: Secondary | ICD-10-CM | POA: Insufficient documentation

## 2023-03-13 DIAGNOSIS — Z171 Estrogen receptor negative status [ER-]: Secondary | ICD-10-CM | POA: Diagnosis not present

## 2023-03-13 DIAGNOSIS — Z7901 Long term (current) use of anticoagulants: Secondary | ICD-10-CM | POA: Insufficient documentation

## 2023-03-13 DIAGNOSIS — I871 Compression of vein: Secondary | ICD-10-CM | POA: Diagnosis not present

## 2023-03-13 MED ORDER — TRASTUZUMAB-DTTB CHEMO 150 MG IV SOLR
750.0000 mg | Freq: Once | INTRAVENOUS | Status: AC
  Administered 2023-03-13: 750 mg via INTRAVENOUS
  Filled 2023-03-13: qty 35.72

## 2023-03-13 MED ORDER — SODIUM CHLORIDE 0.9% FLUSH
10.0000 mL | INTRAVENOUS | Status: DC | PRN
  Administered 2023-03-13: 10 mL

## 2023-03-13 MED ORDER — ACETAMINOPHEN 325 MG PO TABS
650.0000 mg | ORAL_TABLET | Freq: Once | ORAL | Status: AC
  Administered 2023-03-13: 650 mg via ORAL
  Filled 2023-03-13: qty 2

## 2023-03-13 MED ORDER — HEPARIN SOD (PORK) LOCK FLUSH 100 UNIT/ML IV SOLN
500.0000 [IU] | Freq: Once | INTRAVENOUS | Status: AC | PRN
  Administered 2023-03-13: 500 [IU]

## 2023-03-13 MED ORDER — SODIUM CHLORIDE 0.9 % IV SOLN: Freq: Once | INTRAVENOUS | Status: AC

## 2023-03-13 MED ORDER — LORAZEPAM 2 MG/ML IJ SOLN
1.0000 mg | Freq: Once | INTRAMUSCULAR | Status: AC
  Administered 2023-03-13: 1 mg via INTRAVENOUS
  Filled 2023-03-13: qty 1

## 2023-03-13 MED ORDER — DIPHENHYDRAMINE HCL 25 MG PO CAPS
50.0000 mg | ORAL_CAPSULE | Freq: Once | ORAL | Status: AC
  Administered 2023-03-13: 50 mg via ORAL
  Filled 2023-03-13: qty 2

## 2023-03-13 NOTE — Assessment & Plan Note (Signed)
2004: Liver. Lung and bone mets Neoadj chemo TCH X 6 completed April 2005 foll by herceptin maintenance   bilateral mastectomies with bilateral axillary lymph node dissection 12/07/2004, showing             (a) on the right, a mypT1c ypN1 invasive ductal carcinoma, grade 3, estrogen and progesterone receptor negative, HER-2 positive, with an MIB-1 of 31%             (b) on the left, ypT2 ypN1 invasive ductal carcinoma, grade 2, estrogen and progesterone receptor negative, HER-2 positive, with an MIB-1 of 35%.   Adj XRT ----------------------------------------------------------------------------------------------------------------------------------------  Current treatment: Herceptin maintenance therapy Plan to continue Herceptin indefinitely every 28 days.   CT chest abdomen and pelvis 01/02/2023: Surgical changes left chest wall.  No metastatic disease in chest abdomen pelvis.  Subtle subcutaneous nodule right side of the sternum similar and nonspecific.  Fibroid uterus Surgical excision 05/10/2022: No evidence of malignancy.  Fat necrosis/seroma   SVC syndrome: On lifelong anticoagulation.    Bone scan 09/27/2022: No bone metastases ECHO: 10/31/22: EF 55% (Dr. Shirlee Latch is doing her echocardiograms now every 9 months)   Return to clinic for monthly Herceptin infusions

## 2023-03-20 ENCOUNTER — Telehealth: Payer: Self-pay | Admitting: Hematology and Oncology

## 2023-03-20 NOTE — Telephone Encounter (Signed)
Scheduled appointments per WQ. Patient is aware of the made appointments.  

## 2023-04-10 ENCOUNTER — Other Ambulatory Visit: Payer: Self-pay

## 2023-04-10 ENCOUNTER — Inpatient Hospital Stay: Payer: Medicare Other | Attending: Oncology

## 2023-04-10 VITALS — BP 131/76 | HR 55 | Resp 18 | Wt 253.2 lb

## 2023-04-10 DIAGNOSIS — C50812 Malignant neoplasm of overlapping sites of left female breast: Secondary | ICD-10-CM | POA: Insufficient documentation

## 2023-04-10 DIAGNOSIS — Z5112 Encounter for antineoplastic immunotherapy: Secondary | ICD-10-CM | POA: Insufficient documentation

## 2023-04-10 DIAGNOSIS — C50911 Malignant neoplasm of unspecified site of right female breast: Secondary | ICD-10-CM | POA: Diagnosis not present

## 2023-04-10 MED ORDER — DIPHENHYDRAMINE HCL 25 MG PO CAPS
50.0000 mg | ORAL_CAPSULE | Freq: Once | ORAL | Status: AC
  Administered 2023-04-10: 50 mg via ORAL
  Filled 2023-04-10: qty 2

## 2023-04-10 MED ORDER — HEPARIN SOD (PORK) LOCK FLUSH 100 UNIT/ML IV SOLN
500.0000 [IU] | Freq: Once | INTRAVENOUS | Status: AC | PRN
  Administered 2023-04-10: 500 [IU]

## 2023-04-10 MED ORDER — TRASTUZUMAB-DTTB CHEMO 150 MG IV SOLR
750.0000 mg | Freq: Once | INTRAVENOUS | Status: AC
  Administered 2023-04-10: 750 mg via INTRAVENOUS
  Filled 2023-04-10: qty 35.71

## 2023-04-10 MED ORDER — LORAZEPAM 2 MG/ML IJ SOLN
1.0000 mg | Freq: Once | INTRAMUSCULAR | Status: DC
  Filled 2023-04-10: qty 1

## 2023-04-10 MED ORDER — SODIUM CHLORIDE 0.9 % IV SOLN: Freq: Once | INTRAVENOUS | Status: AC

## 2023-04-10 MED ORDER — ACETAMINOPHEN 325 MG PO TABS
650.0000 mg | ORAL_TABLET | Freq: Once | ORAL | Status: AC
  Administered 2023-04-10: 650 mg via ORAL
  Filled 2023-04-10: qty 2

## 2023-04-10 MED ORDER — LORAZEPAM 2 MG/ML IJ SOLN
1.0000 mg | Freq: Once | INTRAMUSCULAR | Status: AC
  Administered 2023-04-10: 1 mg via INTRAVENOUS
  Filled 2023-04-10: qty 1

## 2023-04-10 MED ORDER — SODIUM CHLORIDE 0.9% FLUSH
10.0000 mL | INTRAVENOUS | Status: DC | PRN
  Administered 2023-04-10: 10 mL

## 2023-04-10 NOTE — Progress Notes (Signed)
Per Dr. Earmon Phoenix encounter note form 03/13/2023 "Dr. Shirlee Latch is doing her echocardiograms now every 9 months." OK to proceed with ECHO from 10/31/2022 left EF 55%.

## 2023-04-10 NOTE — Patient Instructions (Signed)
[facility]  Discharge Instructions: Thank you for choosing [facility] to provide your oncology and hematology care.   If you have a lab appointment with the Cancer Center, please go directly to the Cancer Center and check in at the registration area.   Wear comfortable clothing and clothing appropriate for easy access to any Portacath or PICC line.   We strive to give you quality time with your provider. You may need to reschedule your appointment if you arrive late (15 or more minutes).  Arriving late affects you and other patients whose appointments are after yours.  Also, if you miss three or more appointments without notifying the office, you may be dismissed from the clinic at the provider's discretion.      For prescription refill requests, have your pharmacy contact our office and allow 72 hours for refills to be completed.    Today you received the following chemotherapy and/or immunotherapy agents: Ontruzant      To help prevent nausea and vomiting after your treatment, we encourage you to take your nausea medication as directed.  BELOW ARE SYMPTOMS THAT SHOULD BE REPORTED IMMEDIATELY: *FEVER GREATER THAN 100.4 F (38 C) OR HIGHER *CHILLS OR SWEATING *NAUSEA AND VOMITING THAT IS NOT CONTROLLED WITH YOUR NAUSEA MEDICATION *UNUSUAL SHORTNESS OF BREATH *UNUSUAL BRUISING OR BLEEDING *URINARY PROBLEMS (pain or burning when urinating, or frequent urination) *BOWEL PROBLEMS (unusual diarrhea, constipation, pain near the anus) TENDERNESS IN MOUTH AND THROAT WITH OR WITHOUT PRESENCE OF ULCERS (sore throat, sores in mouth, or a toothache) UNUSUAL RASH, SWELLING OR PAIN  UNUSUAL VAGINAL DISCHARGE OR ITCHING   Items with * indicate a potential emergency and should be followed up as soon as possible or go to the Emergency Department if any problems should occur.  Please show the CHEMOTHERAPY ALERT CARD or IMMUNOTHERAPY ALERT CARD at  check-in to the Emergency Department and triage nurse.  Should you have questions after your visit or need to cancel or reschedule your appointment, please contact [facility]  Dept: [phone]  and follow the prompts.  Office hours are 8:00 a.m. to 4:30 p.m. Monday - Friday. Please note that voicemails left after 4:00 p.m. may not be returned until the following business day.  We are closed weekends and major holidays. You have access to a nurse at all times for urgent questions. Please call the main number to the clinic Dept: [phone] and follow the prompts.   For any non-urgent questions, you may also contact your provider using MyChart. We now offer e-Visits for anyone 18 and older to request care online for non-urgent symptoms. For details visit [email or web address].   Also download the MyChart app! Go to the app store, search "MyChart", open the app, select , and log in with your MyChart username and password.   

## 2023-04-13 ENCOUNTER — Other Ambulatory Visit: Payer: Self-pay | Admitting: Hematology and Oncology

## 2023-04-13 DIAGNOSIS — C50919 Malignant neoplasm of unspecified site of unspecified female breast: Secondary | ICD-10-CM

## 2023-04-18 DIAGNOSIS — C50912 Malignant neoplasm of unspecified site of left female breast: Secondary | ICD-10-CM | POA: Diagnosis not present

## 2023-04-18 DIAGNOSIS — C50919 Malignant neoplasm of unspecified site of unspecified female breast: Secondary | ICD-10-CM | POA: Diagnosis not present

## 2023-04-18 DIAGNOSIS — I700 Atherosclerosis of aorta: Secondary | ICD-10-CM | POA: Diagnosis not present

## 2023-04-18 DIAGNOSIS — M170 Bilateral primary osteoarthritis of knee: Secondary | ICD-10-CM | POA: Diagnosis not present

## 2023-04-18 DIAGNOSIS — C50911 Malignant neoplasm of unspecified site of right female breast: Secondary | ICD-10-CM | POA: Diagnosis not present

## 2023-04-18 DIAGNOSIS — I10 Essential (primary) hypertension: Secondary | ICD-10-CM | POA: Diagnosis not present

## 2023-04-18 DIAGNOSIS — D6859 Other primary thrombophilia: Secondary | ICD-10-CM | POA: Diagnosis not present

## 2023-04-18 DIAGNOSIS — Z6841 Body Mass Index (BMI) 40.0 and over, adult: Secondary | ICD-10-CM | POA: Diagnosis not present

## 2023-04-18 DIAGNOSIS — Z1331 Encounter for screening for depression: Secondary | ICD-10-CM | POA: Diagnosis not present

## 2023-04-18 DIAGNOSIS — Z0000 Encounter for general adult medical examination without abnormal findings: Secondary | ICD-10-CM | POA: Diagnosis not present

## 2023-04-18 DIAGNOSIS — Z7189 Other specified counseling: Secondary | ICD-10-CM | POA: Diagnosis not present

## 2023-04-18 DIAGNOSIS — I5032 Chronic diastolic (congestive) heart failure: Secondary | ICD-10-CM | POA: Diagnosis not present

## 2023-04-24 ENCOUNTER — Other Ambulatory Visit: Payer: Self-pay | Admitting: *Deleted

## 2023-04-24 MED ORDER — TEMAZEPAM 30 MG PO CAPS: 30.0000 mg | ORAL_CAPSULE | Freq: Every evening | ORAL | 3 refills | Status: DC | PRN

## 2023-04-24 MED ORDER — ALPRAZOLAM 1 MG PO TABS: 1.0000 mg | ORAL_TABLET | Freq: Three times a day (TID) | ORAL | 3 refills | Status: DC | PRN

## 2023-05-02 ENCOUNTER — Ambulatory Visit (HOSPITAL_COMMUNITY)
Admission: RE | Admit: 2023-05-02 | Discharge: 2023-05-02 | Disposition: A | Discharge status: Home / Self Care | Payer: Medicare Other | Source: Ambulatory Visit | Attending: Hematology and Oncology | Admitting: Hematology and Oncology

## 2023-05-02 DIAGNOSIS — K5730 Diverticulosis of large intestine without perforation or abscess without bleeding: Secondary | ICD-10-CM | POA: Diagnosis not present

## 2023-05-02 DIAGNOSIS — D259 Leiomyoma of uterus, unspecified: Secondary | ICD-10-CM | POA: Diagnosis not present

## 2023-05-02 DIAGNOSIS — K7689 Other specified diseases of liver: Secondary | ICD-10-CM | POA: Diagnosis not present

## 2023-05-02 DIAGNOSIS — C50911 Malignant neoplasm of unspecified site of right female breast: Secondary | ICD-10-CM | POA: Insufficient documentation

## 2023-05-02 LAB — POCT I-STAT CREATININE: Creatinine, Ser: 0.8 mg/dL (ref 0.44–1.00)

## 2023-05-02 MED ORDER — IOHEXOL 300 MG/ML  SOLN
100.0000 mL | Freq: Once | INTRAMUSCULAR | Status: AC | PRN
  Administered 2023-05-02: 100 mL via INTRAVENOUS

## 2023-05-06 NOTE — Progress Notes (Signed)
Patient Care Team: Philemon Kingdom, MD as PCP - General (Internal Medicine) Ranae Pila., MD as Consulting Physician (Pain Medicine) Linda Hedges, MD as Referring Physician (Orthopedic Surgery) Carnella Guadalajara, DO as Consulting Physician (Orthopedic Surgery) Laurey Morale, MD as Consulting Physician (Cardiology) Bensimhon, Bevelyn Buckles, MD as Consulting Physician (Cardiology) Althea Charon, MD (Radiology) Daiva Eves, Lisette Grinder, MD as Consulting Physician (Infectious Diseases) Antony Blackbird, MD as Consulting Physician (Radiation Oncology) Serena Croissant, MD as Consulting Physician (Hematology and Oncology)  DIAGNOSIS: No diagnosis found.  SUMMARY OF ONCOLOGIC HISTORY: Oncology History  Breast cancer metastasized to multiple sites [facility])  09/05/2012 - 09/27/2022 Chemotherapy   Patient is on Treatment Plan : BREAST Trastuzumab q28d     02/26/2013 Initial Diagnosis   history of inflammatory right breast cancer metastatic at presentation September 2004 with involvement of liver and bone, HER-2 positive, estrogen and progesterone receptor negative      - 11/2013 Chemotherapy   carboplatin, docetaxel and Herceptin x 6 completed April 2005     Surgery   bilateral mastectomies with bilateral axillary lymph node dissection 12/07/2004, showing             (a) on the right, a mypT1c ypN1 invasive ductal carcinoma, grade 3, estrogen and progesterone receptor negative, HER-2 positive, with an MIB-1 of 31%             (b) on the left, ypT2 ypN1 invasive ductal carcinoma, grade 2, estrogen and progesterone receptor negative, HER-2 positive, with an MIB-1 of 35%.   01/2015 - 02/2015 Radiation Therapy   Adj XRT    - 03/2018 Chemotherapy   Ixampra   10/25/2022 -  Chemotherapy   Patient is on Treatment Plan : BREAST Trastuzumab IV (8/6) or SQ (600) D1 q21d       CHIEF COMPLIANT: Herceptin maintenance therapy   INTERVAL HISTORY: [name] is a 72 y.o. with the above-mentioned  history of metastatic cancer currently on treatment with Herceptin.  She presents to the clinic for a follow-up.    ALLERGIES:  is allergic to penicillins and adhesive [tape].  MEDICATIONS:  Current Outpatient Medications  Medication Sig Dispense Refill   acetaminophen (TYLENOL) 500 MG tablet Take 1,000 mg by mouth every 6 (six) hours as needed for mild pain or fever.      albuterol (VENTOLIN HFA) 108 (90 Base) MCG/ACT inhaler Inhale 2 puffs into the lungs every 6 (six) hours as needed for wheezing. 1 each 6   ALPRAZolam (XANAX) 1 MG tablet Take 1 tablet (1 mg total) by mouth 3 (three) times daily as needed for anxiety. 60 tablet 3   amLODipine (NORVASC) 10 MG tablet Take 1 tablet (10 mg total) by mouth every morning. 30 tablet 4   apixaban (ELIQUIS) 5 MG TABS tablet Take 1 tablet (5 mg total) by mouth 2 (two) times daily. 180 tablet 3   baclofen (LIORESAL) 10 MG tablet TAKE 1 TABLET BY MOUTH THREE TIMES DAILY AS NEEDED FOR MUSCLE SPASMS 270 tablet 0   carvedilol (COREG) 12.5 MG tablet Take 1 tablet (12.5 mg total) by mouth 2 (two) times daily with a meal. 180 tablet 3   diclofenac sodium (VOLTAREN) 1 % GEL Apply 2 g topically daily as needed (for pain). Apply to knees and shoulders 100 g 6   DULoxetine (CYMBALTA) 20 MG capsule Take 20 mg by mouth daily.     DULoxetine (CYMBALTA) 60 MG capsule Take 60 mg by mouth daily.     furosemide (  LASIX) 40 MG tablet Take 40 mg by mouth as needed.     gabapentin (NEURONTIN) 300 MG capsule TAKE 2 CAPSULES BY MOUTH THREE TIMES DAILY 540 capsule 1   losartan (COZAAR) 100 MG tablet Take 100 mg by mouth every morning.     Multiple Vitamins-Minerals (ZINC PO) Take by mouth daily.     ondansetron (ZOFRAN) 8 MG tablet Take 1 tablet (8 mg total) by mouth every 8 (eight) hours as needed for nausea or vomiting. 20 tablet 1   oxyCODONE-acetaminophen (PERCOCET) 10-325 MG tablet Take 1 tablet by mouth 4 (four) times daily as needed.     potassium chloride SA  (KLOR-CON) 20 MEQ tablet Take 1 tablet (20 mEq total) by mouth daily as needed. With use of lasix 30 tablet 3   predniSONE (DELTASONE) 5 MG tablet Take 5 mg by mouth daily in the afternoon.     spironolactone (ALDACTONE) 25 MG tablet Take 1 tablet daily 90 tablet 3   temazepam (RESTORIL) 30 MG capsule Take 1 capsule (30 mg total) by mouth at bedtime as needed for sleep. 30 capsule 3   No current facility-administered medications for this visit.   Facility-Administered Medications Ordered in Other Visits  Medication Dose Route Frequency Provider Last Rate Last Admin   sodium chloride flush (NS) 0.9 % injection 10 mL  10 mL Intravenous PRN Magrinat, Valentino Hue, MD   10 mL at 12/15/15 1200   sodium chloride flush (NS) 0.9 % injection 10 mL  10 mL Intracatheter PRN Magrinat, Valentino Hue, MD   10 mL at 08/14/18 1116   sodium chloride flush (NS) 0.9 % injection 10 mL  10 mL Intracatheter PRN Magrinat, Valentino Hue, MD        PHYSICAL EXAMINATION: ECOG PERFORMANCE STATUS: {CHL ONC ECOG ZO:[id number]}  There were no vitals filed for this visit. There were no vitals filed for this visit.  BREAST:*** No palpable masses or nodules in either right or left breasts. No palpable axillary supraclavicular or infraclavicular adenopathy no breast tenderness or nipple discharge. (exam performed in the presence of a chaperone)  LABORATORY DATA:  I have reviewed the data as listed    Latest Ref Rng & Units 05/02/2023   11:33 AM 01/16/2023    8:24 AM 11/21/2022    9:29 AM  CMP  Glucose 70 - 99 mg/dL  94  81   BUN 8 - 23 mg/dL  17  24   Creatinine 8.11 - 1.00 mg/dL 9.14  7.82  9.56   Sodium 135 - 145 mmol/L  142  142   Potassium 3.5 - 5.1 mmol/L  4.7  4.1   Chloride 98 - 111 mmol/L  106  108   CO2 22 - 32 mmol/L  30  30   Calcium 8.9 - 10.3 mg/dL  8.7  8.5   Total Protein 6.5 - 8.1 g/dL  6.8  6.8   Total Bilirubin 0.3 - 1.2 mg/dL  0.7  0.5   Alkaline Phos 38 - 126 U/L  79  86   AST 15 - 41 U/L  10  9   ALT 0 -  44 U/L  8  9     Lab Results  Component Value Date   WBC 8.1 01/16/2023   HGB 10.7 (L) 01/16/2023   HCT 31.4 (L) 01/16/2023   MCV 82.8 01/16/2023   PLT 261 01/16/2023   NEUTROABS 5.3 01/16/2023    ASSESSMENT & PLAN:  No problem-specific Assessment & Plan notes found  for this encounter.    No orders of the defined types were placed in this encounter.  The patient has a good understanding of the overall plan. she agrees with it. she will call with any problems that may develop before the next visit here. Total time spent: 30 mins including face to face time and time spent for planning, charting and co-ordination of care   Sherlyn Lick, CMA 05/06/23    I Janan Ridge am acting as a Neurosurgeon for [employer]  ***

## 2023-05-08 ENCOUNTER — Inpatient Hospital Stay: Payer: Medicare Other | Attending: Hematology and Oncology

## 2023-05-08 ENCOUNTER — Inpatient Hospital Stay (HOSPITAL_BASED_OUTPATIENT_CLINIC_OR_DEPARTMENT_OTHER): Payer: Medicare Other | Admitting: Hematology and Oncology

## 2023-05-08 VITALS — BP 168/88 | HR 67 | Temp 97.9°F | Resp 18 | Ht 63.5 in | Wt 247.4 lb

## 2023-05-08 VITALS — BP 142/69 | HR 58 | Resp 16

## 2023-05-08 DIAGNOSIS — Z5112 Encounter for antineoplastic immunotherapy: Secondary | ICD-10-CM | POA: Diagnosis not present

## 2023-05-08 DIAGNOSIS — Z7901 Long term (current) use of anticoagulants: Secondary | ICD-10-CM | POA: Insufficient documentation

## 2023-05-08 DIAGNOSIS — C50812 Malignant neoplasm of overlapping sites of left female breast: Secondary | ICD-10-CM | POA: Insufficient documentation

## 2023-05-08 DIAGNOSIS — Z79899 Other long term (current) drug therapy: Secondary | ICD-10-CM | POA: Diagnosis not present

## 2023-05-08 DIAGNOSIS — C7800 Secondary malignant neoplasm of unspecified lung: Secondary | ICD-10-CM | POA: Diagnosis not present

## 2023-05-08 DIAGNOSIS — Z9013 Acquired absence of bilateral breasts and nipples: Secondary | ICD-10-CM | POA: Diagnosis not present

## 2023-05-08 DIAGNOSIS — Z7952 Long term (current) use of systemic steroids: Secondary | ICD-10-CM | POA: Insufficient documentation

## 2023-05-08 DIAGNOSIS — Z171 Estrogen receptor negative status [ER-]: Secondary | ICD-10-CM | POA: Diagnosis not present

## 2023-05-08 DIAGNOSIS — C50911 Malignant neoplasm of unspecified site of right female breast: Secondary | ICD-10-CM | POA: Diagnosis not present

## 2023-05-08 DIAGNOSIS — C7951 Secondary malignant neoplasm of bone: Secondary | ICD-10-CM | POA: Insufficient documentation

## 2023-05-08 DIAGNOSIS — C787 Secondary malignant neoplasm of liver and intrahepatic bile duct: Secondary | ICD-10-CM | POA: Diagnosis not present

## 2023-05-08 MED ORDER — DIPHENHYDRAMINE HCL 25 MG PO CAPS
50.0000 mg | ORAL_CAPSULE | Freq: Once | ORAL | Status: AC
  Administered 2023-05-08: 50 mg via ORAL
  Filled 2023-05-08: qty 2

## 2023-05-08 MED ORDER — SODIUM CHLORIDE 0.9% FLUSH
10.0000 mL | INTRAVENOUS | Status: DC | PRN
  Administered 2023-05-08: 10 mL

## 2023-05-08 MED ORDER — SODIUM CHLORIDE 0.9 % IV SOLN: Freq: Once | INTRAVENOUS | Status: AC

## 2023-05-08 MED ORDER — TRASTUZUMAB-DTTB CHEMO 150 MG IV SOLR
750.0000 mg | Freq: Once | INTRAVENOUS | Status: AC
  Administered 2023-05-08: 750 mg via INTRAVENOUS
  Filled 2023-05-08: qty 35.71

## 2023-05-08 MED ORDER — HEPARIN SOD (PORK) LOCK FLUSH 100 UNIT/ML IV SOLN
500.0000 [IU] | Freq: Once | INTRAVENOUS | Status: AC | PRN
  Administered 2023-05-08: 500 [IU]

## 2023-05-08 MED ORDER — ACETAMINOPHEN 325 MG PO TABS
650.0000 mg | ORAL_TABLET | Freq: Once | ORAL | Status: AC
  Administered 2023-05-08: 650 mg via ORAL
  Filled 2023-05-08: qty 2

## 2023-05-08 MED ORDER — LORAZEPAM 2 MG/ML IJ SOLN
1.0000 mg | Freq: Once | INTRAMUSCULAR | Status: AC
  Administered 2023-05-08: 1 mg via INTRAVENOUS
  Filled 2023-05-08: qty 1

## 2023-05-08 NOTE — Patient Instructions (Signed)
[facility]  Discharge Instructions: Thank you for choosing [facility] to provide your oncology and hematology care.   If you have a lab appointment with the Cancer Center, please go directly to the Cancer Center and check in at the registration area.   Wear comfortable clothing and clothing appropriate for easy access to any Portacath or PICC line.   We strive to give you quality time with your provider. You may need to reschedule your appointment if you arrive late (15 or more minutes).  Arriving late affects you and other patients whose appointments are after yours.  Also, if you miss three or more appointments without notifying the office, you may be dismissed from the clinic at the provider's discretion.      For prescription refill requests, have your pharmacy contact our office and allow 72 hours for refills to be completed.    Today you received the following chemotherapy and/or immunotherapy agents: Ontruzant      To help prevent nausea and vomiting after your treatment, we encourage you to take your nausea medication as directed.  BELOW ARE SYMPTOMS THAT SHOULD BE REPORTED IMMEDIATELY: *FEVER GREATER THAN 100.4 F (38 C) OR HIGHER *CHILLS OR SWEATING *NAUSEA AND VOMITING THAT IS NOT CONTROLLED WITH YOUR NAUSEA MEDICATION *UNUSUAL SHORTNESS OF BREATH *UNUSUAL BRUISING OR BLEEDING *URINARY PROBLEMS (pain or burning when urinating, or frequent urination) *BOWEL PROBLEMS (unusual diarrhea, constipation, pain near the anus) TENDERNESS IN MOUTH AND THROAT WITH OR WITHOUT PRESENCE OF ULCERS (sore throat, sores in mouth, or a toothache) UNUSUAL RASH, SWELLING OR PAIN  UNUSUAL VAGINAL DISCHARGE OR ITCHING   Items with * indicate a potential emergency and should be followed up as soon as possible or go to the Emergency Department if any problems should occur.  Please show the CHEMOTHERAPY ALERT CARD or IMMUNOTHERAPY ALERT CARD at  check-in to the Emergency Department and triage nurse.  Should you have questions after your visit or need to cancel or reschedule your appointment, please contact [facility]  Dept: [phone]  and follow the prompts.  Office hours are 8:00 a.m. to 4:30 p.m. Monday - Friday. Please note that voicemails left after 4:00 p.m. may not be returned until the following business day.  We are closed weekends and major holidays. You have access to a nurse at all times for urgent questions. Please call the main number to the clinic Dept: [phone] and follow the prompts.   For any non-urgent questions, you may also contact your provider using MyChart. We now offer e-Visits for anyone 18 and older to request care online for non-urgent symptoms. For details visit [email or web address].   Also download the MyChart app! Go to the app store, search "MyChart", open the app, select , and log in with your MyChart username and password.   

## 2023-05-08 NOTE — Assessment & Plan Note (Signed)
2004: Liver. Lung and bone mets Neoadj chemo TCH X 6 completed April 2005 foll by herceptin maintenance   bilateral mastectomies with bilateral axillary lymph node dissection 12/07/2004, showing             (a) on the right, a mypT1c ypN1 invasive ductal carcinoma, grade 3, estrogen and progesterone receptor negative, HER-2 positive, with an MIB-1 of 31%             (b) on the left, ypT2 ypN1 invasive ductal carcinoma, grade 2, estrogen and progesterone receptor negative, HER-2 positive, with an MIB-1 of 35%.   Adj XRT ----------------------------------------------------------------------------------------------------------------------------------------  Current treatment: Herceptin maintenance therapy Plan to continue Herceptin indefinitely every 28 days.   CT chest abdomen and pelvis 01/02/2023: Surgical changes left chest wall.  No metastatic disease in chest abdomen pelvis.  Subtle subcutaneous nodule right side of the sternum similar and nonspecific.  Fibroid uterus Surgical excision 05/10/2022: No evidence of malignancy.  Fat necrosis/seroma   SVC syndrome: On lifelong anticoagulation.    Bone scan 09/27/2022: No bone metastases ECHO: 10/31/22: EF 55% (Dr. Shirlee Latch is doing her echocardiograms now every 9 months) CT CAP 05/05/2023: No evidence of recurrent or metastatic disease in chest abdomen pelvis   Return to clinic for monthly Herceptin infusions Scans will be ordered every 6 months.

## 2023-05-13 DIAGNOSIS — M170 Bilateral primary osteoarthritis of knee: Secondary | ICD-10-CM | POA: Diagnosis not present

## 2023-05-13 DIAGNOSIS — Z6841 Body Mass Index (BMI) 40.0 and over, adult: Secondary | ICD-10-CM | POA: Diagnosis not present

## 2023-05-30 DIAGNOSIS — R262 Difficulty in walking, not elsewhere classified: Secondary | ICD-10-CM | POA: Diagnosis not present

## 2023-05-30 DIAGNOSIS — M25562 Pain in left knee: Secondary | ICD-10-CM | POA: Diagnosis not present

## 2023-05-30 DIAGNOSIS — M1712 Unilateral primary osteoarthritis, left knee: Secondary | ICD-10-CM | POA: Diagnosis not present

## 2023-05-30 DIAGNOSIS — M25561 Pain in right knee: Secondary | ICD-10-CM | POA: Diagnosis not present

## 2023-05-30 DIAGNOSIS — M1711 Unilateral primary osteoarthritis, right knee: Secondary | ICD-10-CM | POA: Diagnosis not present

## 2023-06-04 ENCOUNTER — Encounter: Payer: Self-pay | Admitting: *Deleted

## 2023-06-04 NOTE — Progress Notes (Signed)
Per MD okay to proceed with tx on 06/05/23 with echo from 10/31/22.  Message sent to pt cardiologist, Dr. Shirlee Latch, to schedule pt for repeat echo.

## 2023-06-05 ENCOUNTER — Inpatient Hospital Stay: Payer: Medicare Other | Attending: Oncology

## 2023-06-05 VITALS — BP 124/62 | HR 66 | Temp 98.1°F | Resp 18 | Ht 63.5 in | Wt 252.8 lb

## 2023-06-05 DIAGNOSIS — C50911 Malignant neoplasm of unspecified site of right female breast: Secondary | ICD-10-CM | POA: Diagnosis not present

## 2023-06-05 DIAGNOSIS — Z5112 Encounter for antineoplastic immunotherapy: Secondary | ICD-10-CM | POA: Insufficient documentation

## 2023-06-05 DIAGNOSIS — C50812 Malignant neoplasm of overlapping sites of left female breast: Secondary | ICD-10-CM | POA: Insufficient documentation

## 2023-06-05 MED ORDER — ACETAMINOPHEN 325 MG PO TABS
650.0000 mg | ORAL_TABLET | Freq: Once | ORAL | Status: AC
  Administered 2023-06-05: 650 mg via ORAL
  Filled 2023-06-05: qty 2

## 2023-06-05 MED ORDER — DIPHENHYDRAMINE HCL 25 MG PO CAPS
50.0000 mg | ORAL_CAPSULE | Freq: Once | ORAL | Status: AC
  Administered 2023-06-05: 50 mg via ORAL
  Filled 2023-06-05: qty 2

## 2023-06-05 MED ORDER — LORAZEPAM 2 MG/ML IJ SOLN
1.0000 mg | Freq: Once | INTRAMUSCULAR | Status: AC
  Administered 2023-06-05: 1 mg via INTRAVENOUS
  Filled 2023-06-05: qty 1

## 2023-06-05 MED ORDER — SODIUM CHLORIDE 0.9 % IV SOLN: Freq: Once | INTRAVENOUS | Status: AC

## 2023-06-05 MED ORDER — HEPARIN SOD (PORK) LOCK FLUSH 100 UNIT/ML IV SOLN
500.0000 [IU] | Freq: Once | INTRAVENOUS | Status: AC | PRN
  Administered 2023-06-05: 500 [IU]

## 2023-06-05 MED ORDER — SODIUM CHLORIDE 0.9% FLUSH
10.0000 mL | INTRAVENOUS | Status: DC | PRN
  Administered 2023-06-05: 10 mL

## 2023-06-05 MED ORDER — TRASTUZUMAB-DTTB CHEMO 150 MG IV SOLR
750.0000 mg | Freq: Once | INTRAVENOUS | Status: AC
  Administered 2023-06-05: 750 mg via INTRAVENOUS
  Filled 2023-06-05: qty 35.71

## 2023-06-05 NOTE — Patient Instructions (Signed)
[facility]  Discharge Instructions: Thank you for choosing [facility] to provide your oncology and hematology care.   If you have a lab appointment with the Cancer Center, please go directly to the Cancer Center and check in at the registration area.   Wear comfortable clothing and clothing appropriate for easy access to any Portacath or PICC line.   We strive to give you quality time with your provider. You may need to reschedule your appointment if you arrive late (15 or more minutes).  Arriving late affects you and other patients whose appointments are after yours.  Also, if you miss three or more appointments without notifying the office, you may be dismissed from the clinic at the provider's discretion.      For prescription refill requests, have your pharmacy contact our office and allow 72 hours for refills to be completed.    Today you received the following chemotherapy and/or immunotherapy agents: Ontruzant      To help prevent nausea and vomiting after your treatment, we encourage you to take your nausea medication as directed.  BELOW ARE SYMPTOMS THAT SHOULD BE REPORTED IMMEDIATELY: *FEVER GREATER THAN 100.4 F (38 C) OR HIGHER *CHILLS OR SWEATING *NAUSEA AND VOMITING THAT IS NOT CONTROLLED WITH YOUR NAUSEA MEDICATION *UNUSUAL SHORTNESS OF BREATH *UNUSUAL BRUISING OR BLEEDING *URINARY PROBLEMS (pain or burning when urinating, or frequent urination) *BOWEL PROBLEMS (unusual diarrhea, constipation, pain near the anus) TENDERNESS IN MOUTH AND THROAT WITH OR WITHOUT PRESENCE OF ULCERS (sore throat, sores in mouth, or a toothache) UNUSUAL RASH, SWELLING OR PAIN  UNUSUAL VAGINAL DISCHARGE OR ITCHING   Items with * indicate a potential emergency and should be followed up as soon as possible or go to the Emergency Department if any problems should occur.  Please show the CHEMOTHERAPY ALERT CARD or IMMUNOTHERAPY ALERT CARD at  check-in to the Emergency Department and triage nurse.  Should you have questions after your visit or need to cancel or reschedule your appointment, please contact [facility]  Dept: [phone]  and follow the prompts.  Office hours are 8:00 a.m. to 4:30 p.m. Monday - Friday. Please note that voicemails left after 4:00 p.m. may not be returned until the following business day.  We are closed weekends and major holidays. You have access to a nurse at all times for urgent questions. Please call the main number to the clinic Dept: [phone] and follow the prompts.   For any non-urgent questions, you may also contact your provider using MyChart. We now offer e-Visits for anyone 18 and older to request care online for non-urgent symptoms. For details visit [email or web address].   Also download the MyChart app! Go to the app store, search "MyChart", open the app, select , and log in with your MyChart username and password.   

## 2023-06-06 DIAGNOSIS — M25561 Pain in right knee: Secondary | ICD-10-CM | POA: Diagnosis not present

## 2023-06-06 DIAGNOSIS — R262 Difficulty in walking, not elsewhere classified: Secondary | ICD-10-CM | POA: Diagnosis not present

## 2023-06-06 DIAGNOSIS — M1711 Unilateral primary osteoarthritis, right knee: Secondary | ICD-10-CM | POA: Diagnosis not present

## 2023-06-06 DIAGNOSIS — M25562 Pain in left knee: Secondary | ICD-10-CM | POA: Diagnosis not present

## 2023-06-06 DIAGNOSIS — M1712 Unilateral primary osteoarthritis, left knee: Secondary | ICD-10-CM | POA: Diagnosis not present

## 2023-06-10 ENCOUNTER — Encounter: Payer: Self-pay | Admitting: Hematology and Oncology

## 2023-06-10 ENCOUNTER — Encounter: Payer: Self-pay | Admitting: Oncology

## 2023-06-13 DIAGNOSIS — R262 Difficulty in walking, not elsewhere classified: Secondary | ICD-10-CM | POA: Diagnosis not present

## 2023-06-13 DIAGNOSIS — M25561 Pain in right knee: Secondary | ICD-10-CM | POA: Diagnosis not present

## 2023-06-13 DIAGNOSIS — M1711 Unilateral primary osteoarthritis, right knee: Secondary | ICD-10-CM | POA: Diagnosis not present

## 2023-06-13 DIAGNOSIS — M1712 Unilateral primary osteoarthritis, left knee: Secondary | ICD-10-CM | POA: Diagnosis not present

## 2023-06-13 DIAGNOSIS — M25562 Pain in left knee: Secondary | ICD-10-CM | POA: Diagnosis not present

## 2023-06-20 DIAGNOSIS — M1712 Unilateral primary osteoarthritis, left knee: Secondary | ICD-10-CM | POA: Diagnosis not present

## 2023-06-20 DIAGNOSIS — R262 Difficulty in walking, not elsewhere classified: Secondary | ICD-10-CM | POA: Diagnosis not present

## 2023-06-20 DIAGNOSIS — M25562 Pain in left knee: Secondary | ICD-10-CM | POA: Diagnosis not present

## 2023-06-20 DIAGNOSIS — M1711 Unilateral primary osteoarthritis, right knee: Secondary | ICD-10-CM | POA: Diagnosis not present

## 2023-06-20 DIAGNOSIS — M25561 Pain in right knee: Secondary | ICD-10-CM | POA: Diagnosis not present

## 2023-07-03 ENCOUNTER — Inpatient Hospital Stay: Payer: Medicare Other | Attending: Oncology

## 2023-07-03 VITALS — BP 139/70 | HR 62 | Temp 98.4°F | Resp 16 | Wt 252.2 lb

## 2023-07-03 DIAGNOSIS — C50911 Malignant neoplasm of unspecified site of right female breast: Secondary | ICD-10-CM | POA: Diagnosis not present

## 2023-07-03 DIAGNOSIS — Z5112 Encounter for antineoplastic immunotherapy: Secondary | ICD-10-CM | POA: Insufficient documentation

## 2023-07-03 DIAGNOSIS — C50812 Malignant neoplasm of overlapping sites of left female breast: Secondary | ICD-10-CM | POA: Diagnosis not present

## 2023-07-03 MED ORDER — LORAZEPAM 2 MG/ML IJ SOLN
1.0000 mg | Freq: Once | INTRAMUSCULAR | Status: AC
  Administered 2023-07-03: 1 mg via INTRAVENOUS
  Filled 2023-07-03: qty 1

## 2023-07-03 MED ORDER — DIPHENHYDRAMINE HCL 25 MG PO CAPS
50.0000 mg | ORAL_CAPSULE | Freq: Once | ORAL | Status: AC
  Administered 2023-07-03: 50 mg via ORAL
  Filled 2023-07-03: qty 2

## 2023-07-03 MED ORDER — SODIUM CHLORIDE 0.9 % IV SOLN: Freq: Once | INTRAVENOUS | Status: AC

## 2023-07-03 MED ORDER — TRASTUZUMAB-DTTB CHEMO 150 MG IV SOLR
750.0000 mg | Freq: Once | INTRAVENOUS | Status: AC
  Administered 2023-07-03: 750 mg via INTRAVENOUS
  Filled 2023-07-03: qty 35.71

## 2023-07-03 MED ORDER — ACETAMINOPHEN 325 MG PO TABS
650.0000 mg | ORAL_TABLET | Freq: Once | ORAL | Status: AC
  Administered 2023-07-03: 650 mg via ORAL
  Filled 2023-07-03: qty 2

## 2023-07-03 NOTE — Patient Instructions (Signed)
[facility] - A DEPT OF [facility]  Discharge Instructions: Thank you for choosing [facility] to provide your oncology and hematology care.   If you have a lab appointment with the Cancer Center, please go directly to the Cancer Center and check in at the registration area.   Wear comfortable clothing and clothing appropriate for easy access to any Portacath or PICC line.   We strive to give you quality time with your provider. You may need to reschedule your appointment if you arrive late (15 or more minutes).  Arriving late affects you and other patients whose appointments are after yours.  Also, if you miss three or more appointments without notifying the office, you may be dismissed from the clinic at the provider's discretion.      For prescription refill requests, have your pharmacy contact our office and allow 72 hours for refills to be completed.    Today you received the following chemotherapy and/or immunotherapy agents trastuzumab      To help prevent nausea and vomiting after your treatment, we encourage you to take your nausea medication as directed.  BELOW ARE SYMPTOMS THAT SHOULD BE REPORTED IMMEDIATELY: *FEVER GREATER THAN 100.4 F (38 C) OR HIGHER *CHILLS OR SWEATING *NAUSEA AND VOMITING THAT IS NOT CONTROLLED WITH YOUR NAUSEA MEDICATION *UNUSUAL SHORTNESS OF BREATH *UNUSUAL BRUISING OR BLEEDING *URINARY PROBLEMS (pain or burning when urinating, or frequent urination) *BOWEL PROBLEMS (unusual diarrhea, constipation, pain near the anus) TENDERNESS IN MOUTH AND THROAT WITH OR WITHOUT PRESENCE OF ULCERS (sore throat, sores in mouth, or a toothache) UNUSUAL RASH, SWELLING OR PAIN  UNUSUAL VAGINAL DISCHARGE OR ITCHING   Items with * indicate a potential emergency and should be followed up as soon as possible or go to the Emergency Department if any problems should occur.  Please show the CHEMOTHERAPY ALERT CARD or  IMMUNOTHERAPY ALERT CARD at check-in to the Emergency Department and triage nurse.  Should you have questions after your visit or need to cancel or reschedule your appointment, please contact [facility] - A DEPT OF [facility]  Dept: [phone]  and follow the prompts.  Office hours are 8:00 a.m. to 4:30 p.m. Monday - Friday. Please note that voicemails left after 4:00 p.m. may not be returned until the following business day.  We are closed weekends and major holidays. You have access to a nurse at all times for urgent questions. Please call the main number to the clinic Dept: [phone] and follow the prompts.   For any non-urgent questions, you may also contact your provider using MyChart. We now offer e-Visits for anyone 52 and older to request care online for non-urgent symptoms. For details visit [email or web address].   Also download the MyChart app! Go to the app store, search "MyChart", open the app, select , and log in with your MyChart username and password.

## 2023-07-08 DIAGNOSIS — M170 Bilateral primary osteoarthritis of knee: Secondary | ICD-10-CM | POA: Diagnosis not present

## 2023-07-08 DIAGNOSIS — Z6841 Body Mass Index (BMI) 40.0 and over, adult: Secondary | ICD-10-CM | POA: Diagnosis not present

## 2023-07-09 ENCOUNTER — Ambulatory Visit (HOSPITAL_COMMUNITY)
Admission: RE | Admit: 2023-07-09 | Discharge: 2023-07-09 | Discharge status: Home / Self Care | Payer: Medicare Other | Source: Ambulatory Visit | Attending: Cardiology | Admitting: Cardiology

## 2023-07-09 DIAGNOSIS — I5032 Chronic diastolic (congestive) heart failure: Secondary | ICD-10-CM

## 2023-07-09 DIAGNOSIS — I083 Combined rheumatic disorders of mitral, aortic and tricuspid valves: Secondary | ICD-10-CM | POA: Diagnosis not present

## 2023-07-09 DIAGNOSIS — Z853 Personal history of malignant neoplasm of breast: Secondary | ICD-10-CM | POA: Insufficient documentation

## 2023-07-09 DIAGNOSIS — I110 Hypertensive heart disease with heart failure: Secondary | ICD-10-CM | POA: Insufficient documentation

## 2023-07-09 LAB — ECHOCARDIOGRAM COMPLETE
Area-P 1/2: 3.2 cm2
S' Lateral: 2.9 cm

## 2023-07-12 ENCOUNTER — Telehealth (HOSPITAL_COMMUNITY): Payer: Self-pay

## 2023-07-12 NOTE — Telephone Encounter (Addendum)
Pt aware, agreeable, and verbalized understanding   ----- Message from Marca Ancona sent at 07/09/2023  4:01 PM EST ----- EF 55-60%, normal.

## 2023-07-31 ENCOUNTER — Inpatient Hospital Stay (HOSPITAL_BASED_OUTPATIENT_CLINIC_OR_DEPARTMENT_OTHER): Payer: Medicare Other | Admitting: Hematology and Oncology

## 2023-07-31 ENCOUNTER — Inpatient Hospital Stay: Payer: Medicare Other | Attending: Oncology

## 2023-07-31 VITALS — BP 124/75 | HR 79 | Temp 98.2°F | Resp 16 | Wt 253.0 lb

## 2023-07-31 DIAGNOSIS — C50911 Malignant neoplasm of unspecified site of right female breast: Secondary | ICD-10-CM

## 2023-07-31 DIAGNOSIS — C7800 Secondary malignant neoplasm of unspecified lung: Secondary | ICD-10-CM | POA: Diagnosis not present

## 2023-07-31 DIAGNOSIS — Z5112 Encounter for antineoplastic immunotherapy: Secondary | ICD-10-CM | POA: Insufficient documentation

## 2023-07-31 DIAGNOSIS — C7951 Secondary malignant neoplasm of bone: Secondary | ICD-10-CM | POA: Diagnosis not present

## 2023-07-31 DIAGNOSIS — I871 Compression of vein: Secondary | ICD-10-CM | POA: Insufficient documentation

## 2023-07-31 DIAGNOSIS — C50812 Malignant neoplasm of overlapping sites of left female breast: Secondary | ICD-10-CM | POA: Diagnosis not present

## 2023-07-31 DIAGNOSIS — Z7901 Long term (current) use of anticoagulants: Secondary | ICD-10-CM | POA: Insufficient documentation

## 2023-07-31 DIAGNOSIS — C50011 Malignant neoplasm of nipple and areola, right female breast: Secondary | ICD-10-CM

## 2023-07-31 DIAGNOSIS — Z95828 Presence of other vascular implants and grafts: Secondary | ICD-10-CM

## 2023-07-31 MED ORDER — HEPARIN SOD (PORK) LOCK FLUSH 100 UNIT/ML IV SOLN: 500.0000 [IU] | Freq: Once | INTRAVENOUS | Status: DC | PRN

## 2023-07-31 MED ORDER — SODIUM CHLORIDE 0.9 % IV SOLN: Freq: Once | INTRAVENOUS | Status: AC

## 2023-07-31 MED ORDER — DIPHENHYDRAMINE HCL 25 MG PO CAPS
50.0000 mg | ORAL_CAPSULE | Freq: Once | ORAL | Status: AC
  Administered 2023-07-31: 50 mg via ORAL
  Filled 2023-07-31: qty 2

## 2023-07-31 MED ORDER — ACETAMINOPHEN 325 MG PO TABS
650.0000 mg | ORAL_TABLET | Freq: Once | ORAL | Status: AC
  Administered 2023-07-31: 650 mg via ORAL
  Filled 2023-07-31: qty 2

## 2023-07-31 MED ORDER — TRASTUZUMAB-DTTB CHEMO 150 MG IV SOLR
750.0000 mg | Freq: Once | INTRAVENOUS | Status: AC
  Administered 2023-07-31: 750 mg via INTRAVENOUS
  Filled 2023-07-31: qty 35.71

## 2023-07-31 MED ORDER — LORAZEPAM 2 MG/ML IJ SOLN
1.0000 mg | Freq: Once | INTRAMUSCULAR | Status: AC
  Administered 2023-07-31: 1 mg via INTRAVENOUS
  Filled 2023-07-31: qty 1

## 2023-07-31 NOTE — Patient Instructions (Signed)
[facility] - A DEPT OF [facility]  Discharge Instructions: Thank you for choosing [facility] to provide your oncology and hematology care.   If you have a lab appointment with the Cancer Center, please go directly to the Cancer Center and check in at the registration area.   Wear comfortable clothing and clothing appropriate for easy access to any Portacath or PICC line.   We strive to give you quality time with your provider. You may need to reschedule your appointment if you arrive late (15 or more minutes).  Arriving late affects you and other patients whose appointments are after yours.  Also, if you miss three or more appointments without notifying the office, you may be dismissed from the clinic at the provider's discretion.      For prescription refill requests, have your pharmacy contact our office and allow 72 hours for refills to be completed.    Today you received the following chemotherapy and/or immunotherapy agents ontruzant      To help prevent nausea and vomiting after your treatment, we encourage you to take your nausea medication as directed.  BELOW ARE SYMPTOMS THAT SHOULD BE REPORTED IMMEDIATELY: *FEVER GREATER THAN 100.4 F (38 C) OR HIGHER *CHILLS OR SWEATING *NAUSEA AND VOMITING THAT IS NOT CONTROLLED WITH YOUR NAUSEA MEDICATION *UNUSUAL SHORTNESS OF BREATH *UNUSUAL BRUISING OR BLEEDING *URINARY PROBLEMS (pain or burning when urinating, or frequent urination) *BOWEL PROBLEMS (unusual diarrhea, constipation, pain near the anus) TENDERNESS IN MOUTH AND THROAT WITH OR WITHOUT PRESENCE OF ULCERS (sore throat, sores in mouth, or a toothache) UNUSUAL RASH, SWELLING OR PAIN  UNUSUAL VAGINAL DISCHARGE OR ITCHING   Items with * indicate a potential emergency and should be followed up as soon as possible or go to the Emergency Department if any problems should occur.  Please show the CHEMOTHERAPY ALERT CARD or IMMUNOTHERAPY  ALERT CARD at check-in to the Emergency Department and triage nurse.  Should you have questions after your visit or need to cancel or reschedule your appointment, please contact [facility]  Dept: [phone]  and follow the prompts.  Office hours are 8:00 a.m. to 4:30 p.m. Monday - Friday. Please note that voicemails left after 4:00 p.m. may not be returned until the following business day.  We are closed weekends and major holidays. You have access to a nurse at all times for urgent questions. Please call the main number to the clinic Dept: [phone] and follow the prompts.   For any non-urgent questions, you may also contact your provider using MyChart. We now offer e-Visits for anyone 56 and older to request care online for non-urgent symptoms. For details visit [email or web address].   Also download the MyChart app! Go to the app store, search "MyChart", open the app, select [facility], and log in with your MyChart username and password.

## 2023-07-31 NOTE — Assessment & Plan Note (Signed)
2004: Liver. Lung and bone mets Neoadj chemo TCH X 6 completed April 2005 foll by herceptin maintenance   bilateral mastectomies with bilateral axillary lymph node dissection 12/07/2004, showing             (a) on the right, a mypT1c ypN1 invasive ductal carcinoma, grade 3, estrogen and progesterone receptor negative, HER-2 positive, with an MIB-1 of 31%             (b) on the left, ypT2 ypN1 invasive ductal carcinoma, grade 2, estrogen and progesterone receptor negative, HER-2 positive, with an MIB-1 of 35%.   Adj XRT ----------------------------------------------------------------------------------------------------------------------------------------  Current treatment: Herceptin maintenance therapy Plan to continue Herceptin indefinitely every 28 days.   CT chest abdomen and pelvis 01/02/2023: Surgical changes left chest wall.  No metastatic disease in chest abdomen pelvis.  Subtle subcutaneous nodule right side of the sternum similar and nonspecific.  Fibroid uterus Surgical excision 05/10/2022: No evidence of malignancy.  Fat necrosis/seroma   SVC syndrome: On lifelong anticoagulation.    Bone scan 09/27/2022: No bone metastases ECHO: 10/31/22: EF 55% (Dr. Shirlee Latch is doing her echocardiograms now every 9 months) CT CAP 05/05/2023: No evidence of recurrent or metastatic disease in chest abdomen pelvis   Return to clinic for monthly Herceptin infusions MD visits every 3 months.  Scans will be ordered every 6 months.  (These will be arranged in March 2025)

## 2023-08-02 ENCOUNTER — Encounter: Payer: Self-pay | Admitting: Hematology and Oncology

## 2023-08-02 ENCOUNTER — Encounter: Payer: Self-pay | Admitting: Oncology

## 2023-08-02 NOTE — Progress Notes (Signed)
Patient Care Team: Philemon Kingdom, MD as PCP - General (Internal Medicine) Ranae Pila., MD as Consulting Physician (Pain Medicine) Linda Hedges, MD as Referring Physician (Orthopedic Surgery) Carnella Guadalajara, DO as Consulting Physician (Orthopedic Surgery) Laurey Morale, MD as Consulting Physician (Cardiology) Bensimhon, Bevelyn Buckles, MD as Consulting Physician (Cardiology) Althea Charon, MD (Radiology) Daiva Eves, Lisette Grinder, MD as Consulting Physician (Infectious Diseases) Antony Blackbird, MD as Consulting Physician (Radiation Oncology) Serena Croissant, MD as Consulting Physician (Hematology and Oncology)  DIAGNOSIS:  Encounter Diagnosis  Name Primary?   Carcinoma of right breast metastatic to multiple sites [facility]) Yes    SUMMARY OF ONCOLOGIC HISTORY: Oncology History  Breast cancer metastasized to multiple sites [facility])  09/05/2012 - 09/27/2022 Chemotherapy   Patient is on Treatment Plan : BREAST Trastuzumab q28d     02/26/2013 Initial Diagnosis   history of inflammatory right breast cancer metastatic at presentation September 2004 with involvement of liver and bone, HER-2 positive, estrogen and progesterone receptor negative      - 11/2013 Chemotherapy   carboplatin, docetaxel and Herceptin x 6 completed April 2005     Surgery   bilateral mastectomies with bilateral axillary lymph node dissection 12/07/2004, showing             (a) on the right, a mypT1c ypN1 invasive ductal carcinoma, grade 3, estrogen and progesterone receptor negative, HER-2 positive, with an MIB-1 of 31%             (b) on the left, ypT2 ypN1 invasive ductal carcinoma, grade 2, estrogen and progesterone receptor negative, HER-2 positive, with an MIB-1 of 35%.   01/2015 - 02/2015 Radiation Therapy   Adj XRT    - 03/2018 Chemotherapy   Ixampra   10/25/2022 -  Chemotherapy   Patient is on Treatment Plan : BREAST Trastuzumab IV (8/6) or SQ (600) D1 q21d       CHIEF COMPLIANT: Herceptin  maintenance  HISTORY OF PRESENT ILLNESS:  History of Present Illness   The patient, with a history of breast cancer and knee issues, presents for a routine follow-up. She reports having attended physical therapy for her knee problems, but expresses reluctance towards the suggested knee replacement surgery. The patient also mentions receiving steroid and gel shots for her knees. She has been managing her mental health amidst personal losses and the holiday season, which she finds challenging. The patient continues to receive Herceptin treatment for her breast cancer, with no reported side effects.         ALLERGIES:  is allergic to penicillins and adhesive [tape].  MEDICATIONS:  Current Outpatient Medications  Medication Sig Dispense Refill   acetaminophen (TYLENOL) 500 MG tablet Take 1,000 mg by mouth every 6 (six) hours as needed for mild pain or fever.      albuterol (VENTOLIN HFA) 108 (90 Base) MCG/ACT inhaler Inhale 2 puffs into the lungs every 6 (six) hours as needed for wheezing. 1 each 6   ALPRAZolam (XANAX) 1 MG tablet Take 1 tablet (1 mg total) by mouth 3 (three) times daily as needed for anxiety. 60 tablet 3   amLODipine (NORVASC) 10 MG tablet Take 1 tablet (10 mg total) by mouth every morning. 30 tablet 4   apixaban (ELIQUIS) 5 MG TABS tablet Take 1 tablet (5 mg total) by mouth 2 (two) times daily. 180 tablet 3   baclofen (LIORESAL) 10 MG tablet TAKE 1 TABLET BY MOUTH THREE TIMES DAILY AS NEEDED FOR MUSCLE SPASMS 270 tablet 0  carvedilol (COREG) 12.5 MG tablet Take 1 tablet (12.5 mg total) by mouth 2 (two) times daily with a meal. 180 tablet 3   diclofenac sodium (VOLTAREN) 1 % GEL Apply 2 g topically daily as needed (for pain). Apply to knees and shoulders 100 g 6   DULoxetine (CYMBALTA) 20 MG capsule Take 20 mg by mouth daily.     DULoxetine (CYMBALTA) 60 MG capsule Take 60 mg by mouth daily.     furosemide (LASIX) 40 MG tablet Take 40 mg by mouth as needed.     gabapentin  (NEURONTIN) 300 MG capsule TAKE 2 CAPSULES BY MOUTH THREE TIMES DAILY 540 capsule 1   losartan (COZAAR) 100 MG tablet Take 100 mg by mouth every morning.     Multiple Vitamins-Minerals (ZINC PO) Take by mouth daily.     ondansetron (ZOFRAN) 8 MG tablet Take 1 tablet (8 mg total) by mouth every 8 (eight) hours as needed for nausea or vomiting. 20 tablet 1   oxyCODONE-acetaminophen (PERCOCET) 10-325 MG tablet Take 1 tablet by mouth 4 (four) times daily as needed.     potassium chloride SA (KLOR-CON) 20 MEQ tablet Take 1 tablet (20 mEq total) by mouth daily as needed. With use of lasix 30 tablet 3   predniSONE (DELTASONE) 5 MG tablet Take 5 mg by mouth daily in the afternoon.     spironolactone (ALDACTONE) 25 MG tablet Take 1 tablet daily 90 tablet 3   temazepam (RESTORIL) 30 MG capsule Take 1 capsule (30 mg total) by mouth at bedtime as needed for sleep. 30 capsule 3   No current facility-administered medications for this visit.   Facility-Administered Medications Ordered in Other Visits  Medication Dose Route Frequency Provider Last Rate Last Admin   sodium chloride flush (NS) 0.9 % injection 10 mL  10 mL Intravenous PRN Magrinat, Valentino Hue, MD   10 mL at 12/15/15 1200   sodium chloride flush (NS) 0.9 % injection 10 mL  10 mL Intracatheter PRN Magrinat, Valentino Hue, MD   10 mL at 08/14/18 1116   sodium chloride flush (NS) 0.9 % injection 10 mL  10 mL Intracatheter PRN Magrinat, Valentino Hue, MD        PHYSICAL EXAMINATION: ECOG PERFORMANCE STATUS: 1 - Symptomatic but completely ambulatory  Vitals:   07/31/23 1129  BP: 124/75  Pulse: 79  Resp: 16  Temp: 98.2 F (36.8 C)  SpO2: 96%   Filed Weights   07/31/23 1129  Weight: 253 lb (114.8 kg)    LABORATORY DATA:  I have reviewed the data as listed    Latest Ref Rng & Units 05/02/2023   11:33 AM 01/16/2023    8:24 AM 11/21/2022    9:29 AM  CMP  Glucose 70 - 99 mg/dL  94  81   BUN 8 - 23 mg/dL  17  24   Creatinine 1.61 - 1.00 mg/dL 0.96   0.45  4.09   Sodium 135 - 145 mmol/L  142  142   Potassium 3.5 - 5.1 mmol/L  4.7  4.1   Chloride 98 - 111 mmol/L  106  108   CO2 22 - 32 mmol/L  30  30   Calcium 8.9 - 10.3 mg/dL  8.7  8.5   Total Protein 6.5 - 8.1 g/dL  6.8  6.8   Total Bilirubin 0.3 - 1.2 mg/dL  0.7  0.5   Alkaline Phos 38 - 126 U/L  79  86   AST 15 - 41  U/L  10  9   ALT 0 - 44 U/L  8  9     Lab Results  Component Value Date   WBC 8.1 01/16/2023   HGB 10.7 (L) 01/16/2023   HCT 31.4 (L) 01/16/2023   MCV 82.8 01/16/2023   PLT 261 01/16/2023   NEUTROABS 5.3 01/16/2023    ASSESSMENT & PLAN:  Breast cancer metastasized to multiple sites 2004: Liver. Lung and bone mets Neoadj chemo TCH X 6 completed April 2005 foll by herceptin maintenance   bilateral mastectomies with bilateral axillary lymph node dissection 12/07/2004, showing             (a) on the right, a mypT1c ypN1 invasive ductal carcinoma, grade 3, estrogen and progesterone receptor negative, HER-2 positive, with an MIB-1 of 31%             (b) on the left, ypT2 ypN1 invasive ductal carcinoma, grade 2, estrogen and progesterone receptor negative, HER-2 positive, with an MIB-1 of 35%.   Adj XRT ----------------------------------------------------------------------------------------------------------------------------------------  Current treatment: Herceptin maintenance therapy Plan to continue Herceptin indefinitely every 28 days.   CT chest abdomen and pelvis 01/02/2023: Surgical changes left chest wall.  No metastatic disease in chest abdomen pelvis.  Subtle subcutaneous nodule right side of the sternum similar and nonspecific.  Fibroid uterus Surgical excision 05/10/2022: No evidence of malignancy.  Fat necrosis/seroma   SVC syndrome: On lifelong anticoagulation.    Bone scan 09/27/2022: No bone metastases ECHO: 10/31/22: EF 55% (Dr. Shirlee Latch is doing her echocardiograms now every 9 months) CT CAP 05/05/2023: No evidence of recurrent or metastatic disease in  chest abdomen pelvis   Return to clinic for monthly Herceptin infusions MD visits every 3 months.  Scans will be ordered every 6 months.  (These will be arranged in March 2025) ------------------------------------- Assessment and Plan    Knee Osteoarthritis Patient reports ongoing knee pain. Physical therapy was attempted but discontinued. Orthopedic consultation with Dr. Lynelle Smoke resulted in recommendation for bilateral knee replacement. Patient is considering this option. In the interim, alternating steroid and gel injections have been administered for symptom management. -Continue alternating steroid and gel injections as per Dr. Ala Bent plan. -Consider knee replacement surgery as discussed with orthopedic specialist.  Breast Cancer Patient is on Herceptin treatment. Recent echocardiogram showed normal ejection fraction (55-60%) and small relaxation issue, which is not of concern. No evidence of cancer progression. -Continue Herceptin treatment every four weeks. -Plan for scans in three months to monitor for any progression.  Medication Assistance Program Patient's assistance program for Herceptin with [employer] is ending on December 31st. -Pharmacy to assist with renewal process for the medication assistance program.  Follow-up Patient has another appointment scheduled. -Continue with scheduled follow-up appointments.          Orders Placed This Encounter  Procedures   CT CHEST ABDOMEN PELVIS W CONTRAST    Standing Status:   Future    Standing Expiration Date:   07/30/2024    Order Specific Question:   If indicated for the ordered procedure, I authorize the administration of contrast media per Radiology protocol    Answer:   Yes    Order Specific Question:   Does the patient have a contrast media/X-ray dye allergy?    Answer:   No    Order Specific Question:   Preferred imaging location?    Answer:   [facility]    Order Specific Question:   Release to  patient    Answer:  Immediate    Order Specific Question:   If indicated for the ordered procedure, I authorize the administration of oral contrast media per Radiology protocol    Answer:   Yes   The patient has a good understanding of the overall plan. she agrees with it. she will call with any problems that may develop before the next visit here. Total time spent: 30 mins including face to face time and time spent for planning, charting and co-ordination of care   Tamsen Meek, MD 08/02/23

## 2023-08-06 ENCOUNTER — Other Ambulatory Visit: Payer: Self-pay | Admitting: *Deleted

## 2023-08-06 MED ORDER — APIXABAN 5 MG PO TABS: 5.0000 mg | ORAL_TABLET | Freq: Two times a day (BID) | ORAL | 3 refills | Status: AC

## 2023-08-08 ENCOUNTER — Telehealth: Payer: Self-pay

## 2023-08-08 NOTE — Telephone Encounter (Signed)
Notified Patient of completion of [employer] Application for [employer]. Application and supporting documentation faxed to [employer]. Fax transmission confirmation received.

## 2023-08-12 DIAGNOSIS — M170 Bilateral primary osteoarthritis of knee: Secondary | ICD-10-CM | POA: Diagnosis not present

## 2023-08-13 ENCOUNTER — Other Ambulatory Visit (HOSPITAL_COMMUNITY): Payer: Self-pay

## 2023-08-13 ENCOUNTER — Telehealth: Payer: Self-pay | Admitting: *Deleted

## 2023-08-13 NOTE — Telephone Encounter (Signed)
Received call from pt stating starting 2025 she will have to pay $1,300 out of pocket before she will qualify for drug assistance with [employer].  The cost of Eliquis would be $600 a month until pt reaches that out of pocket deductible of $1,300.  Another option would be for pt to be switched to Dabigatran (generic for Pradaxa) and with a good RX savings card, it would cost $69 per month.  Pt states she will thing about her decision and alert our office.

## 2023-08-24 ENCOUNTER — Other Ambulatory Visit: Payer: Self-pay | Admitting: Hematology and Oncology

## 2023-08-26 ENCOUNTER — Telehealth: Payer: Self-pay

## 2023-08-26 NOTE — Telephone Encounter (Signed)
Called Mrs. Bringman back about her message she left regarding a recent fall she had where she bruised her sternum. She stated she has blue circles on her chest, she is healing slowly, and is very sore especially with certain positions. She wanted to see if her scan in March could be moved up but I told her that insurance would likely not cover it since the scan is for her cancer management, and not fall management. She is currently taking oxycodone for bilateral knee pain and per her MD, she needs knee replacement ASAP. The oxycodone helps manage her pain with walking but does not take it all away. Contacted [facility] provider and she advised that patient go to the ED or urgent care for evaluation. Sending to Lillard Anes, NP to see if she agrees with the plan. Lorayne Marek, RN

## 2023-08-26 NOTE — Telephone Encounter (Signed)
Per our APP consultation, advised Mrs. Freiberg to go to the urgent care at [facility] since she lives in [place].  Left her a message with this information and advised her that it is important that she gets evaluated as she is on blood thinners.  Lorayne Marek, RN

## 2023-08-29 ENCOUNTER — Inpatient Hospital Stay: Payer: Medicare Other | Attending: Oncology

## 2023-08-29 VITALS — BP 118/61 | HR 74 | Temp 98.3°F | Resp 20 | Wt 253.5 lb

## 2023-08-29 DIAGNOSIS — C787 Secondary malignant neoplasm of liver and intrahepatic bile duct: Secondary | ICD-10-CM | POA: Insufficient documentation

## 2023-08-29 DIAGNOSIS — C50911 Malignant neoplasm of unspecified site of right female breast: Secondary | ICD-10-CM | POA: Diagnosis not present

## 2023-08-29 DIAGNOSIS — I871 Compression of vein: Secondary | ICD-10-CM | POA: Diagnosis not present

## 2023-08-29 DIAGNOSIS — Z7901 Long term (current) use of anticoagulants: Secondary | ICD-10-CM | POA: Diagnosis not present

## 2023-08-29 DIAGNOSIS — E669 Obesity, unspecified: Secondary | ICD-10-CM | POA: Diagnosis not present

## 2023-08-29 DIAGNOSIS — Z5112 Encounter for antineoplastic immunotherapy: Secondary | ICD-10-CM | POA: Insufficient documentation

## 2023-08-29 DIAGNOSIS — C7951 Secondary malignant neoplasm of bone: Secondary | ICD-10-CM | POA: Insufficient documentation

## 2023-08-29 DIAGNOSIS — Z171 Estrogen receptor negative status [ER-]: Secondary | ICD-10-CM | POA: Diagnosis not present

## 2023-08-29 DIAGNOSIS — C50812 Malignant neoplasm of overlapping sites of left female breast: Secondary | ICD-10-CM | POA: Insufficient documentation

## 2023-08-29 DIAGNOSIS — I482 Chronic atrial fibrillation, unspecified: Secondary | ICD-10-CM | POA: Insufficient documentation

## 2023-08-29 MED ORDER — ACETAMINOPHEN 325 MG PO TABS
650.0000 mg | ORAL_TABLET | Freq: Once | ORAL | Status: AC
  Administered 2023-08-29: 650 mg via ORAL
  Filled 2023-08-29: qty 2

## 2023-08-29 MED ORDER — DIPHENHYDRAMINE HCL 25 MG PO CAPS
50.0000 mg | ORAL_CAPSULE | Freq: Once | ORAL | Status: AC
  Administered 2023-08-29: 50 mg via ORAL
  Filled 2023-08-29: qty 2

## 2023-08-29 MED ORDER — LORAZEPAM 2 MG/ML IJ SOLN
1.0000 mg | Freq: Once | INTRAMUSCULAR | Status: AC
  Administered 2023-08-29: 1 mg via INTRAVENOUS
  Filled 2023-08-29: qty 1

## 2023-08-29 MED ORDER — TRASTUZUMAB-DTTB CHEMO 150 MG IV SOLR
750.0000 mg | Freq: Once | INTRAVENOUS | Status: AC
  Administered 2023-08-29: 750 mg via INTRAVENOUS
  Filled 2023-08-29: qty 35.71

## 2023-08-29 MED ORDER — HEPARIN SOD (PORK) LOCK FLUSH 100 UNIT/ML IV SOLN
500.0000 [IU] | Freq: Once | INTRAVENOUS | Status: AC | PRN
  Administered 2023-08-29: 500 [IU]

## 2023-08-29 MED ORDER — SODIUM CHLORIDE 0.9% FLUSH
10.0000 mL | INTRAVENOUS | Status: DC | PRN
Start: 2023-08-29 — End: 2023-08-29
  Administered 2023-08-29: 10 mL

## 2023-08-29 MED ORDER — SODIUM CHLORIDE 0.9 % IV SOLN: Freq: Once | INTRAVENOUS | Status: AC

## 2023-08-29 NOTE — Patient Instructions (Signed)
 [facility] - A DEPT OF [facility]  Discharge Instructions: Thank you for choosing [facility] to provide your oncology and hematology care.   If you have a lab appointment with the Cancer Center, please go directly to the Cancer Center and check in at the registration area.   Wear comfortable clothing and clothing appropriate for easy access to any Portacath or PICC line.   We strive to give you quality time with your provider. You may need to reschedule your appointment if you arrive late (15 or more minutes).  Arriving late affects you and other patients whose appointments are after yours.  Also, if you miss three or more appointments without notifying the office, you may be dismissed from the clinic at the provider's discretion.      For prescription refill requests, have your pharmacy contact our office and allow 72 hours for refills to be completed.    Today you received the following chemotherapy and/or immunotherapy agent: Trastuzumab (Ontruzant)      To help prevent nausea and vomiting after your treatment, we encourage you to take your nausea medication as directed.  BELOW ARE SYMPTOMS THAT SHOULD BE REPORTED IMMEDIATELY: *FEVER GREATER THAN 100.4 F (38 C) OR HIGHER *CHILLS OR SWEATING *NAUSEA AND VOMITING THAT IS NOT CONTROLLED WITH YOUR NAUSEA MEDICATION *UNUSUAL SHORTNESS OF BREATH *UNUSUAL BRUISING OR BLEEDING *URINARY PROBLEMS (pain or burning when urinating, or frequent urination) *BOWEL PROBLEMS (unusual diarrhea, constipation, pain near the anus) TENDERNESS IN MOUTH AND THROAT WITH OR WITHOUT PRESENCE OF ULCERS (sore throat, sores in mouth, or a toothache) UNUSUAL RASH, SWELLING OR PAIN  UNUSUAL VAGINAL DISCHARGE OR ITCHING   Items with * indicate a potential emergency and should be followed up as soon as possible or go to the Emergency Department if any problems should occur.  Please show the CHEMOTHERAPY ALERT CARD or  IMMUNOTHERAPY ALERT CARD at check-in to the Emergency Department and triage nurse.  Should you have questions after your visit or need to cancel or reschedule your appointment, please contact [facility]  Dept: [phone]  and follow the prompts.  Office hours are 8:00 a.m. to 4:30 p.m. Monday - Friday. Please note that voicemails left after 4:00 p.m. may not be returned until the following business day.  We are closed weekends and major holidays. You have access to a nurse at all times for urgent questions. Please call the main number to the clinic Dept: [phone] and follow the prompts.   For any non-urgent questions, you may also contact your provider using MyChart. We now offer e-Visits for anyone 73 and older to request care online for non-urgent symptoms. For details visit [email or web address].   Also download the MyChart app! Go to the app store, search "MyChart", open the app, select [facility], and log in with your MyChart username and password.  Trastuzumab Injection What is this medication? TRASTUZUMAB (tras TOO zoo mab) treats breast cancer and stomach cancer. It works by blocking a protein that causes cancer cells to grow and multiply. This helps to slow or stop the spread of cancer cells. This medicine may be used for other purposes; ask your health care provider or pharmacist if you have questions. COMMON BRAND NAME(S): Herceptin, Marlowe Alt, Ontruzant, Trazimera What should I tell my care team before I take this medication? They need to know if you have any of  these conditions: Heart failure Lung disease An unusual or allergic reaction to trastuzumab, other medications, foods, dyes, or preservatives Pregnant or trying to get pregnant Breast-feeding How should I use this medication? This medication is injected into a vein. It is given by your care team in a hospital or clinic setting. Talk to your  care team about the use of this medication in children. It is not approved for use in children. Overdosage: If you think you have taken too much of this medicine contact a poison control center or emergency room at once. NOTE: This medicine is only for you. Do not share this medicine with others. What if I miss a dose? Keep appointments for follow-up doses. It is important not to miss your dose. Call your care team if you are unable to keep an appointment. What may interact with this medication? Certain types of chemotherapy, such as daunorubicin, doxorubicin, epirubicin, idarubicin This list may not describe all possible interactions. Give your health care provider a list of all the medicines, herbs, non-prescription drugs, or dietary supplements you use. Also tell them if you smoke, drink alcohol, or use illegal drugs. Some items may interact with your medicine. What should I watch for while using this medication? Your condition will be monitored carefully while you are receiving this medication. This medication may make you feel generally unwell. This is not uncommon, as chemotherapy affects healthy cells as well as cancer cells. Report any side effects. Continue your course of treatment even though you feel ill unless your care team tells you to stop. This medication may increase your risk of getting an infection. Call your care team for advice if you get a fever, chills, sore throat, or other symptoms of a cold or flu. Do not treat yourself. Try to avoid being around people who are sick. Avoid taking medications that contain aspirin, acetaminophen, ibuprofen, naproxen, or ketoprofen unless instructed by your care team. These medications can hide a fever. Talk to your care team if you may be pregnant. Serious birth defects can occur if you take this medication during pregnancy and for 7 months after the last dose. You will need a negative pregnancy test before starting this medication. Contraception  is recommended while taking this medication and for 7 months after the last dose. Your care team can help you find the option that works for you. Do not breastfeed while taking this medication and for 7 months after stopping treatment. What side effects may I notice from receiving this medication? Side effects that you should report to your care team as soon as possible: Allergic reactions or angioedema--skin rash, itching or hives, swelling of the face, eyes, lips, tongue, arms, or legs, trouble swallowing or breathing Dry cough, shortness of breath or trouble breathing Heart failure--shortness of breath, swelling of the ankles, feet, or hands, sudden weight gain, unusual weakness or fatigue Infection--fever, chills, cough, or sore throat Infusion reactions--chest pain, shortness of breath or trouble breathing, feeling faint or lightheaded Side effects that usually do not require medical attention (report to your care team if they continue or are bothersome): Diarrhea Dizziness Headache Nausea Trouble sleeping Vomiting This list may not describe all possible side effects. Call your doctor for medical advice about side effects. You may report side effects to FDA at 1-800-FDA-1088. Where should I keep my medication? This medication is given in a hospital or clinic. It will not be stored at home. NOTE: This sheet is a summary. It may not cover all possible information.  If you have questions about this medicine, talk to your doctor, pharmacist, or health care provider.  2024 Elsevier/Gold Standard (2021-12-26 00:00:00)

## 2023-08-29 NOTE — Progress Notes (Signed)
 Pt. complained of upper right and left chest/sternum pain from a previous fall in November. Rates pain 6/10 states it is achy and sharp at times Daphane Shepherd, PA in for discussion. Referred pt. to follow up with PCP and/or [facility].

## 2023-08-30 ENCOUNTER — Other Ambulatory Visit: Payer: Self-pay | Admitting: *Deleted

## 2023-08-30 MED ORDER — TEMAZEPAM 30 MG PO CAPS: 30.0000 mg | ORAL_CAPSULE | Freq: Every day | ORAL | 4 refills | Status: DC

## 2023-08-30 MED ORDER — ALPRAZOLAM 1 MG PO TABS: 1.0000 mg | ORAL_TABLET | Freq: Three times a day (TID) | ORAL | 3 refills | Status: DC | PRN

## 2023-09-02 DIAGNOSIS — M170 Bilateral primary osteoarthritis of knee: Secondary | ICD-10-CM | POA: Diagnosis not present

## 2023-09-02 DIAGNOSIS — R0789 Other chest pain: Secondary | ICD-10-CM | POA: Diagnosis not present

## 2023-09-02 DIAGNOSIS — Z23 Encounter for immunization: Secondary | ICD-10-CM | POA: Diagnosis not present

## 2023-09-02 DIAGNOSIS — Z6841 Body Mass Index (BMI) 40.0 and over, adult: Secondary | ICD-10-CM | POA: Diagnosis not present

## 2023-09-25 ENCOUNTER — Inpatient Hospital Stay: Payer: Medicare Other

## 2023-09-25 ENCOUNTER — Inpatient Hospital Stay (HOSPITAL_BASED_OUTPATIENT_CLINIC_OR_DEPARTMENT_OTHER): Payer: Medicare Other | Admitting: Hematology and Oncology

## 2023-09-25 VITALS — BP 149/82 | HR 84 | Temp 98.3°F | Resp 18 | Ht 63.5 in | Wt 254.4 lb

## 2023-09-25 VITALS — BP 142/79 | HR 77 | Resp 18

## 2023-09-25 DIAGNOSIS — C787 Secondary malignant neoplasm of liver and intrahepatic bile duct: Secondary | ICD-10-CM | POA: Diagnosis not present

## 2023-09-25 DIAGNOSIS — C50911 Malignant neoplasm of unspecified site of right female breast: Secondary | ICD-10-CM

## 2023-09-25 DIAGNOSIS — C50812 Malignant neoplasm of overlapping sites of left female breast: Secondary | ICD-10-CM | POA: Diagnosis not present

## 2023-09-25 DIAGNOSIS — C7951 Secondary malignant neoplasm of bone: Secondary | ICD-10-CM | POA: Diagnosis not present

## 2023-09-25 DIAGNOSIS — I482 Chronic atrial fibrillation, unspecified: Secondary | ICD-10-CM | POA: Diagnosis not present

## 2023-09-25 DIAGNOSIS — Z5112 Encounter for antineoplastic immunotherapy: Secondary | ICD-10-CM | POA: Diagnosis not present

## 2023-09-25 MED ORDER — SODIUM CHLORIDE 0.9 % IV SOLN: Freq: Once | INTRAVENOUS | Status: AC

## 2023-09-25 MED ORDER — HEPARIN SOD (PORK) LOCK FLUSH 100 UNIT/ML IV SOLN
500.0000 [IU] | Freq: Once | INTRAVENOUS | Status: AC | PRN
  Administered 2023-09-25: 500 [IU]

## 2023-09-25 MED ORDER — SODIUM CHLORIDE 0.9% FLUSH
10.0000 mL | INTRAVENOUS | Status: DC | PRN
Start: 2023-09-25 — End: 2023-09-25
  Administered 2023-09-25: 10 mL

## 2023-09-25 MED ORDER — LORAZEPAM 2 MG/ML IJ SOLN
1.0000 mg | Freq: Once | INTRAMUSCULAR | Status: AC
  Administered 2023-09-25: 1 mg via INTRAVENOUS
  Filled 2023-09-25: qty 1

## 2023-09-25 MED ORDER — DIPHENHYDRAMINE HCL 25 MG PO CAPS
50.0000 mg | ORAL_CAPSULE | Freq: Once | ORAL | Status: AC
  Administered 2023-09-25: 50 mg via ORAL
  Filled 2023-09-25: qty 2

## 2023-09-25 MED ORDER — TRASTUZUMAB-DTTB CHEMO 150 MG IV SOLR
750.0000 mg | Freq: Once | INTRAVENOUS | Status: AC
  Administered 2023-09-25: 750 mg via INTRAVENOUS
  Filled 2023-09-25: qty 35.71

## 2023-09-25 MED ORDER — ACETAMINOPHEN 325 MG PO TABS
650.0000 mg | ORAL_TABLET | Freq: Once | ORAL | Status: AC
  Administered 2023-09-25: 650 mg via ORAL
  Filled 2023-09-25: qty 2

## 2023-09-25 NOTE — Patient Instructions (Signed)
[facility]  Discharge Instructions: Thank you for choosing [facility] to provide your oncology and hematology care.   If you have a lab appointment with the Cancer Center, please go directly to the Cancer Center and check in at the registration area.   Wear comfortable clothing and clothing appropriate for easy access to any Portacath or PICC line.   We strive to give you quality time with your provider. You may need to reschedule your appointment if you arrive late (15 or more minutes).  Arriving late affects you and other patients whose appointments are after yours.  Also, if you miss three or more appointments without notifying the office, you may be dismissed from the clinic at the provider's discretion.      For prescription refill requests, have your pharmacy contact our office and allow 72 hours for refills to be completed.    Today you received the following chemotherapy and/or immunotherapy agent: Trastuzumab (Ontruzant)      To help prevent nausea and vomiting after your treatment, we encourage you to take your nausea medication as directed.  BELOW ARE SYMPTOMS THAT SHOULD BE REPORTED IMMEDIATELY: *FEVER GREATER THAN 100.4 F (38 C) OR HIGHER *CHILLS OR SWEATING *NAUSEA AND VOMITING THAT IS NOT CONTROLLED WITH YOUR NAUSEA MEDICATION *UNUSUAL SHORTNESS OF BREATH *UNUSUAL BRUISING OR BLEEDING *URINARY PROBLEMS (pain or burning when urinating, or frequent urination) *BOWEL PROBLEMS (unusual diarrhea, constipation, pain near the anus) TENDERNESS IN MOUTH AND THROAT WITH OR WITHOUT PRESENCE OF ULCERS (sore throat, sores in mouth, or a toothache) UNUSUAL RASH, SWELLING OR PAIN  UNUSUAL VAGINAL DISCHARGE OR ITCHING   Items with * indicate a potential emergency and should be followed up as soon as possible or go to the Emergency Department if any problems should occur.  Please show the CHEMOTHERAPY ALERT CARD or  IMMUNOTHERAPY ALERT CARD at check-in to the Emergency Department and triage nurse.  Should you have questions after your visit or need to cancel or reschedule your appointment, please contact [facility]  Dept: [phone]  and follow the prompts.  Office hours are 8:00 a.m. to 4:30 p.m. Monday - Friday. Please note that voicemails left after 4:00 p.m. may not be returned until the following business day.  We are closed weekends and major holidays. You have access to a nurse at all times for urgent questions. Please call the main number to the clinic Dept: [phone] and follow the prompts.   For any non-urgent questions, you may also contact your provider using MyChart. We now offer e-Visits for anyone 73 and older to request care online for non-urgent symptoms. For details visit [email or web address].   Also download the MyChart app! Go to the app store, search "MyChart", open the app, select [facility], and log in with your MyChart username and password.  Trastuzumab Injection What is this medication? TRASTUZUMAB (tras TOO zoo mab) treats breast cancer and stomach cancer. It works by blocking a protein that causes cancer cells to grow and multiply. This helps to slow or stop the spread of cancer cells. This medicine may be used for other purposes; ask your health care provider or pharmacist if you have questions. COMMON BRAND NAME(S): Herceptin, Marlowe Alt, Ontruzant, Trazimera What should I tell my care team before I take this medication? They need to know if you have any of  these conditions: Heart failure Lung disease An unusual or allergic reaction to trastuzumab, other medications, foods, dyes, or preservatives Pregnant or trying to get pregnant Breast-feeding How should I use this medication? This medication is injected into a vein. It is given by your care team in a hospital or clinic setting. Talk to your  care team about the use of this medication in children. It is not approved for use in children. Overdosage: If you think you have taken too much of this medicine contact a poison control center or emergency room at once. NOTE: This medicine is only for you. Do not share this medicine with others. What if I miss a dose? Keep appointments for follow-up doses. It is important not to miss your dose. Call your care team if you are unable to keep an appointment. What may interact with this medication? Certain types of chemotherapy, such as daunorubicin, doxorubicin, epirubicin, idarubicin This list may not describe all possible interactions. Give your health care provider a list of all the medicines, herbs, non-prescription drugs, or dietary supplements you use. Also tell them if you smoke, drink alcohol, or use illegal drugs. Some items may interact with your medicine. What should I watch for while using this medication? Your condition will be monitored carefully while you are receiving this medication. This medication may make you feel generally unwell. This is not uncommon, as chemotherapy affects healthy cells as well as cancer cells. Report any side effects. Continue your course of treatment even though you feel ill unless your care team tells you to stop. This medication may increase your risk of getting an infection. Call your care team for advice if you get a fever, chills, sore throat, or other symptoms of a cold or flu. Do not treat yourself. Try to avoid being around people who are sick. Avoid taking medications that contain aspirin, acetaminophen, ibuprofen, naproxen, or ketoprofen unless instructed by your care team. These medications can hide a fever. Talk to your care team if you may be pregnant. Serious birth defects can occur if you take this medication during pregnancy and for 7 months after the last dose. You will need a negative pregnancy test before starting this medication. Contraception  is recommended while taking this medication and for 7 months after the last dose. Your care team can help you find the option that works for you. Do not breastfeed while taking this medication and for 7 months after stopping treatment. What side effects may I notice from receiving this medication? Side effects that you should report to your care team as soon as possible: Allergic reactions or angioedema--skin rash, itching or hives, swelling of the face, eyes, lips, tongue, arms, or legs, trouble swallowing or breathing Dry cough, shortness of breath or trouble breathing Heart failure--shortness of breath, swelling of the ankles, feet, or hands, sudden weight gain, unusual weakness or fatigue Infection--fever, chills, cough, or sore throat Infusion reactions--chest pain, shortness of breath or trouble breathing, feeling faint or lightheaded Side effects that usually do not require medical attention (report to your care team if they continue or are bothersome): Diarrhea Dizziness Headache Nausea Trouble sleeping Vomiting This list may not describe all possible side effects. Call your doctor for medical advice about side effects. You may report side effects to FDA at 1-800-FDA-1088. Where should I keep my medication? This medication is given in a hospital or clinic. It will not be stored at home. NOTE: This sheet is a summary. It may not cover all possible information.  If you have questions about this medicine, talk to your doctor, pharmacist, or health care provider.  2024 Elsevier/Gold Standard (2021-12-26 00:00:00)

## 2023-09-25 NOTE — Assessment & Plan Note (Signed)
2004: Liver. Lung and bone mets Neoadj chemo TCH X 6 completed April 2005 foll by herceptin maintenance   bilateral mastectomies with bilateral axillary lymph node dissection 12/07/2004, showing             (a) on the right, a mypT1c ypN1 invasive ductal carcinoma, grade 3, estrogen and progesterone receptor negative, HER-2 positive, with an MIB-1 of 31%             (b) on the left, ypT2 ypN1 invasive ductal carcinoma, grade 2, estrogen and progesterone receptor negative, HER-2 positive, with an MIB-1 of 35%.   Adj XRT ----------------------------------------------------------------------------------------------------------------------------------------  Current treatment: Herceptin maintenance therapy Plan to continue Herceptin indefinitely every 28 days.   CT chest abdomen and pelvis 01/02/2023: Surgical changes left chest wall.  No metastatic disease in chest abdomen pelvis.  Subtle subcutaneous nodule right side of the sternum similar and nonspecific.  Fibroid uterus Surgical excision 05/10/2022: No evidence of malignancy.  Fat necrosis/seroma   SVC syndrome: On lifelong anticoagulation.    Bone scan 09/27/2022: No bone metastases ECHO: 10/31/22: EF 55% (Dr. Shirlee Latch is doing her echocardiograms now every 9 months) CT CAP 05/05/2023: No evidence of recurrent or metastatic disease in chest abdomen pelvis   Return to clinic for monthly Herceptin infusions MD visits every 3 months.  Scans will be ordered every 6 months.  (These will be arranged in March 2025)

## 2023-09-25 NOTE — Progress Notes (Signed)
Patient Care Team: Philemon Kingdom, MD as PCP - General (Internal Medicine) Ranae Pila., MD as Consulting Physician (Pain Medicine) Linda Hedges, MD as Referring Physician (Orthopedic Surgery) Carnella Guadalajara, DO as Consulting Physician (Orthopedic Surgery) Laurey Morale, MD as Consulting Physician (Cardiology) Bensimhon, Bevelyn Buckles, MD as Consulting Physician (Cardiology) Althea Charon, MD (Radiology) Daiva Eves, Lisette Grinder, MD as Consulting Physician (Infectious Diseases) Antony Blackbird, MD as Consulting Physician (Radiation Oncology) Serena Croissant, MD as Consulting Physician (Hematology and Oncology)  DIAGNOSIS:  Encounter Diagnosis  Name Primary?   Carcinoma of right breast metastatic to multiple sites [facility]) Yes    SUMMARY OF ONCOLOGIC HISTORY: Oncology History  Breast cancer metastasized to multiple sites [facility])  09/05/2012 - 09/27/2022 Chemotherapy   Patient is on Treatment Plan : BREAST Trastuzumab q28d     02/26/2013 Initial Diagnosis   history of inflammatory right breast cancer metastatic at presentation September 2004 with involvement of liver and bone, HER-2 positive, estrogen and progesterone receptor negative      - 11/2013 Chemotherapy   carboplatin, docetaxel and Herceptin x 6 completed April 2005     Surgery   bilateral mastectomies with bilateral axillary lymph node dissection 12/07/2004, showing             (a) on the right, a mypT1c ypN1 invasive ductal carcinoma, grade 3, estrogen and progesterone receptor negative, HER-2 positive, with an MIB-1 of 31%             (b) on the left, ypT2 ypN1 invasive ductal carcinoma, grade 2, estrogen and progesterone receptor negative, HER-2 positive, with an MIB-1 of 35%.   01/2015 - 02/2015 Radiation Therapy   Adj XRT    - 03/2018 Chemotherapy   Ixampra   10/25/2022 -  Chemotherapy   Patient is on Treatment Plan : BREAST Trastuzumab IV (8/6) or SQ (600) D1 q21d       CHIEF COMPLIANT: Follow-up on  Herceptin  HISTORY OF PRESENT ILLNESS:   History of Present Illness   The patient presents with issues related to blood thinner affordability and a recent fall injury. She is accompanied by her oldest grandson, a Consulting civil engineer at [employer].  She is experiencing difficulties with the affordability of her prescribed blood thinner, Eliquis, which has required her to spend nearly $1,400 to qualify for assistance. She is exploring alternative options due to the high cost. She is currently taking Eliquis and has been informed about various blood thinner options, including Coumadin, Xarelto, Lovenox, and Pradaxa.  She experienced a fall on November 23rd at 8:30 PM in her grandson's bedroom, tripping over a rocker. This resulted in a bruised and swollen sternum, causing significant bruising and soreness, especially when breathing deeply or coughing. She has been managing the pain with oxycodone for her knees and arthritis-strength Tylenol. The swelling has decreased, and the bruising is improving. She was reassured by a specialist that there was no puncture wound, and she has been monitoring the healing process.  She has a history of knee pain and is currently taking oxycodone for this condition. Her knees are causing her more discomfort than any other issue at present. She has been advised to lose 30 pounds before any surgical intervention can be considered for her knees.  Her family history includes congestive heart failure and prostate cancer in her brothers and father, and various types of cancer in her mother's sisters. She is the only surviving member of her immediate family, having outlived her parents and three brothers.  ALLERGIES:  is allergic to penicillins and adhesive [tape].  MEDICATIONS:  Current Outpatient Medications  Medication Sig Dispense Refill   acetaminophen (TYLENOL) 500 MG tablet Take 1,000 mg by mouth every 6 (six) hours as needed for mild pain or fever.       albuterol (VENTOLIN HFA) 108 (90 Base) MCG/ACT inhaler Inhale 2 puffs into the lungs every 6 (six) hours as needed for wheezing. 1 each 6   ALPRAZolam (XANAX) 1 MG tablet Take 1 tablet (1 mg total) by mouth 3 (three) times daily as needed for anxiety. 60 tablet 3   amLODipine (NORVASC) 10 MG tablet Take 1 tablet (10 mg total) by mouth every morning. 30 tablet 4   apixaban (ELIQUIS) 5 MG TABS tablet Take 1 tablet (5 mg total) by mouth 2 (two) times daily. 180 tablet 3   baclofen (LIORESAL) 10 MG tablet TAKE 1 TABLET BY MOUTH THREE TIMES DAILY AS NEEDED FOR MUSCLE SPASMS 270 tablet 0   carvedilol (COREG) 12.5 MG tablet Take 1 tablet (12.5 mg total) by mouth 2 (two) times daily with a meal. 180 tablet 3   diclofenac sodium (VOLTAREN) 1 % GEL Apply 2 g topically daily as needed (for pain). Apply to knees and shoulders 100 g 6   DULoxetine (CYMBALTA) 20 MG capsule Take 20 mg by mouth daily.     DULoxetine (CYMBALTA) 60 MG capsule Take 60 mg by mouth daily.     furosemide (LASIX) 40 MG tablet Take 40 mg by mouth as needed.     gabapentin (NEURONTIN) 300 MG capsule TAKE 2 CAPSULES BY MOUTH THREE TIMES DAILY 540 capsule 1   losartan (COZAAR) 100 MG tablet Take 100 mg by mouth every morning.     Multiple Vitamins-Minerals (ZINC PO) Take by mouth daily.     ondansetron (ZOFRAN) 8 MG tablet Take 1 tablet (8 mg total) by mouth every 8 (eight) hours as needed for nausea or vomiting. 20 tablet 1   oxyCODONE-acetaminophen (PERCOCET) 10-325 MG tablet Take 1 tablet by mouth 4 (four) times daily as needed.     potassium chloride SA (KLOR-CON) 20 MEQ tablet Take 1 tablet (20 mEq total) by mouth daily as needed. With use of lasix 30 tablet 3   predniSONE (DELTASONE) 5 MG tablet Take 5 mg by mouth daily in the afternoon.     spironolactone (ALDACTONE) 25 MG tablet Take 1 tablet daily 90 tablet 3   temazepam (RESTORIL) 30 MG capsule Take 1 capsule (30 mg total) by mouth at bedtime. 30 capsule 4   No current  facility-administered medications for this visit.   Facility-Administered Medications Ordered in Other Visits  Medication Dose Route Frequency Provider Last Rate Last Admin   sodium chloride flush (NS) 0.9 % injection 10 mL  10 mL Intravenous PRN Magrinat, Valentino Hue, MD   10 mL at 12/15/15 1200   sodium chloride flush (NS) 0.9 % injection 10 mL  10 mL Intracatheter PRN Magrinat, Valentino Hue, MD   10 mL at 08/14/18 1116   sodium chloride flush (NS) 0.9 % injection 10 mL  10 mL Intracatheter PRN Magrinat, Valentino Hue, MD        PHYSICAL EXAMINATION: ECOG PERFORMANCE STATUS: 1 - Symptomatic but completely ambulatory  Vitals:   09/25/23 0822  BP: (!) 149/82  Pulse: 84  Resp: 18  Temp: 98.3 F (36.8 C)  SpO2: 97%   Filed Weights   09/25/23 0822  Weight: 254 lb 6.4 oz (115.4 kg)    Physical  Exam          (exam performed in the presence of a chaperone)  LABORATORY DATA:  I have reviewed the data as listed    Latest Ref Rng & Units 05/02/2023   11:33 AM 01/16/2023    8:24 AM 11/21/2022    9:29 AM  CMP  Glucose 70 - 99 mg/dL  94  81   BUN 8 - 23 mg/dL  17  24   Creatinine 1.61 - 1.00 mg/dL 0.96  0.45  4.09   Sodium 135 - 145 mmol/L  142  142   Potassium 3.5 - 5.1 mmol/L  4.7  4.1   Chloride 98 - 111 mmol/L  106  108   CO2 22 - 32 mmol/L  30  30   Calcium 8.9 - 10.3 mg/dL  8.7  8.5   Total Protein 6.5 - 8.1 g/dL  6.8  6.8   Total Bilirubin 0.3 - 1.2 mg/dL  0.7  0.5   Alkaline Phos 38 - 126 U/L  79  86   AST 15 - 41 U/L  10  9   ALT 0 - 44 U/L  8  9     Lab Results  Component Value Date   WBC 8.1 01/16/2023   HGB 10.7 (L) 01/16/2023   HCT 31.4 (L) 01/16/2023   MCV 82.8 01/16/2023   PLT 261 01/16/2023   NEUTROABS 5.3 01/16/2023    ASSESSMENT & PLAN:  Breast cancer metastasized to multiple sites 2004: Liver. Lung and bone mets Neoadj chemo TCH X 6 completed April 2005 foll by herceptin maintenance   bilateral mastectomies with bilateral axillary lymph node dissection  12/07/2004, showing             (a) on the right, a mypT1c ypN1 invasive ductal carcinoma, grade 3, estrogen and progesterone receptor negative, HER-2 positive, with an MIB-1 of 31%             (b) on the left, ypT2 ypN1 invasive ductal carcinoma, grade 2, estrogen and progesterone receptor negative, HER-2 positive, with an MIB-1 of 35%.   Adj XRT ----------------------------------------------------------------------------------------------------------------------------------------  Current treatment: Herceptin maintenance therapy Plan to continue Herceptin indefinitely every 28 days.   CT chest abdomen and pelvis 01/02/2023: Surgical changes left chest wall.  No metastatic disease in chest abdomen pelvis.  Subtle subcutaneous nodule right side of the sternum similar and nonspecific.  Fibroid uterus Surgical excision 05/10/2022: No evidence of malignancy.  Fat necrosis/seroma   SVC syndrome: On lifelong anticoagulation.  (Because of cost issues we may be switching her from Eliquis to Pradaxa.  I gave her additional information about Pradaxa and the cost)  Bone scan 09/27/2022: No bone metastases ECHO: 10/31/22: EF 55% (Dr. Shirlee Latch is doing her echocardiograms now every 9 months) CT CAP 05/05/2023: No evidence of recurrent or metastatic disease in chest abdomen pelvis   Return to clinic for monthly Herceptin infusions Echo to be done every 6 months MD visits every 3 months.  Scans will be ordered every 6 months.  (These will be arranged in March 2025) ------------------------------------- Assessment and Plan    Atrial Fibrillation Chronic atrial fibrillation requiring lifelong anticoagulation. Current medication, Eliquis, is financially unsustainable. Discussed alternative anticoagulants, including Coumadin, Xarelto, Lovenox, and Pradaxa. Pradaxa (dabigatran) is a viable option with strong data supporting its use for blood clot prevention and treatment in AFib patients. Informed consent obtained,  discussing the twice-daily dosing schedule and the need for lifelong anticoagulation. - Switch to Pradaxa (dabigatran) twice  daily - Send MyChart message if medication runs out before next visit  Fall with Chest Contusion Sustained a fall on November 23, resulting in a chest contusion with significant bruising and swelling. Symptoms included pain with deep breaths and coughing. Symptoms have improved, and there is no evidence of a puncture wound or other severe injury. Advised to continue current pain management regimen. - Continue current pain management regimen with oxycodone and arthritis strength Tylenol  Obesity Needs to lose 30 pounds before knee surgery. Discussed weight loss options, including weight loss injections (Ozempic, Wegovy, Mounjaro, Zepbound). These medications suppress appetite and slow gastric movement, but can cause nausea and vomiting. Insurance coverage for these medications is uncertain. Discussed the potential for weight regain if injections are stopped without lifestyle changes. Informed consent obtained, discussing the risks of nausea and vomiting, and the need for insurance verification. - Call insurance to check coverage for Ozempic, Wegovy, Mounjaro, and Zepbound - Consider [employer] program if medications are covered  General Health Maintenance Discussed the importance of maintaining a healthy weight to prevent heart attacks, strokes, and cancer. Emphasized the need for lifestyle changes to sustain weight loss. - Encourage lifestyle modifications to support weight loss  Follow-up - Order scans for March - Follow-up appointment on March 26.          Orders Placed This Encounter  Procedures   CT CHEST ABDOMEN PELVIS W CONTRAST    Standing Status:   Future    Expected Date:   11/13/2023    Expiration Date:   09/24/2024    If indicated for the ordered procedure, I authorize the administration of contrast media per Radiology protocol:    Yes    Does the patient have a contrast media/X-ray dye allergy?:   Yes    Preferred imaging location?:   [facility]    Release to patient:   Immediate    If indicated for the ordered procedure, I authorize the administration of oral contrast media per Radiology protocol:   Yes   The patient has a good understanding of the overall plan. she agrees with it. she will call with any problems that may develop before the next visit here. Total time spent: 30 mins including face to face time and time spent for planning, charting and co-ordination of care   Tamsen Meek, MD 09/25/23

## 2023-10-04 ENCOUNTER — Other Ambulatory Visit (HOSPITAL_COMMUNITY): Payer: Self-pay | Admitting: *Deleted

## 2023-10-04 DIAGNOSIS — I5032 Chronic diastolic (congestive) heart failure: Secondary | ICD-10-CM

## 2023-10-04 DIAGNOSIS — C50011 Malignant neoplasm of nipple and areola, right female breast: Secondary | ICD-10-CM

## 2023-10-23 ENCOUNTER — Telehealth: Payer: Self-pay | Admitting: Hematology and Oncology

## 2023-10-23 ENCOUNTER — Inpatient Hospital Stay: Payer: Medicare Other | Attending: Oncology

## 2023-10-23 VITALS — BP 134/81 | HR 66 | Temp 98.5°F | Resp 15 | Wt 249.0 lb

## 2023-10-23 DIAGNOSIS — C50911 Malignant neoplasm of unspecified site of right female breast: Secondary | ICD-10-CM | POA: Diagnosis not present

## 2023-10-23 DIAGNOSIS — Z5112 Encounter for antineoplastic immunotherapy: Secondary | ICD-10-CM | POA: Diagnosis not present

## 2023-10-23 DIAGNOSIS — C50812 Malignant neoplasm of overlapping sites of left female breast: Secondary | ICD-10-CM | POA: Insufficient documentation

## 2023-10-23 MED ORDER — SODIUM CHLORIDE 0.9 % IV SOLN: Freq: Once | INTRAVENOUS | Status: AC

## 2023-10-23 MED ORDER — DIPHENHYDRAMINE HCL 25 MG PO CAPS
50.0000 mg | ORAL_CAPSULE | Freq: Once | ORAL | Status: AC
  Administered 2023-10-23: 50 mg via ORAL
  Filled 2023-10-23: qty 2

## 2023-10-23 MED ORDER — SODIUM CHLORIDE 0.9% FLUSH
10.0000 mL | INTRAVENOUS | Status: DC | PRN
Start: 2023-10-23 — End: 2023-10-23
  Administered 2023-10-23: 10 mL

## 2023-10-23 MED ORDER — HEPARIN SOD (PORK) LOCK FLUSH 100 UNIT/ML IV SOLN
500.0000 [IU] | Freq: Once | INTRAVENOUS | Status: AC | PRN
  Administered 2023-10-23: 500 [IU]

## 2023-10-23 MED ORDER — ACETAMINOPHEN 325 MG PO TABS
650.0000 mg | ORAL_TABLET | Freq: Once | ORAL | Status: AC
  Administered 2023-10-23: 650 mg via ORAL
  Filled 2023-10-23: qty 2

## 2023-10-23 MED ORDER — LORAZEPAM 2 MG/ML IJ SOLN
1.0000 mg | Freq: Once | INTRAMUSCULAR | Status: AC
Start: 2023-10-23 — End: 2023-10-23
  Administered 2023-10-23: 1 mg via INTRAVENOUS
  Filled 2023-10-23: qty 1

## 2023-10-23 MED ORDER — TRASTUZUMAB-DTTB CHEMO 150 MG IV SOLR
750.0000 mg | Freq: Once | INTRAVENOUS | Status: AC
  Administered 2023-10-23: 750 mg via INTRAVENOUS
  Filled 2023-10-23: qty 35.71

## 2023-10-23 NOTE — Telephone Encounter (Signed)
 Scheduled appointments per WQ. Patient is aware of all made appointments.

## 2023-10-23 NOTE — Patient Instructions (Signed)

## 2023-10-24 ENCOUNTER — Other Ambulatory Visit: Payer: Self-pay

## 2023-11-11 DIAGNOSIS — M170 Bilateral primary osteoarthritis of knee: Secondary | ICD-10-CM | POA: Diagnosis not present

## 2023-11-12 ENCOUNTER — Telehealth (HOSPITAL_COMMUNITY): Payer: Self-pay

## 2023-11-12 ENCOUNTER — Ambulatory Visit (HOSPITAL_BASED_OUTPATIENT_CLINIC_OR_DEPARTMENT_OTHER)
Admission: RE | Admit: 2023-11-12 | Discharge: 2023-11-12 | Disposition: A | Discharge status: Home / Self Care | Payer: Medicare Other | Source: Ambulatory Visit | Attending: Cardiology | Admitting: Cardiology

## 2023-11-12 ENCOUNTER — Other Ambulatory Visit (HOSPITAL_COMMUNITY): Payer: Self-pay

## 2023-11-12 ENCOUNTER — Encounter (HOSPITAL_COMMUNITY): Payer: Self-pay | Admitting: Cardiology

## 2023-11-12 ENCOUNTER — Ambulatory Visit (HOSPITAL_COMMUNITY)
Admission: RE | Admit: 2023-11-12 | Discharge: 2023-11-12 | Disposition: A | Discharge status: Home / Self Care | Payer: Medicare Other | Source: Ambulatory Visit | Attending: Cardiology | Admitting: Cardiology

## 2023-11-12 VITALS — BP 162/90 | HR 63 | Wt 253.4 lb

## 2023-11-12 DIAGNOSIS — Z8249 Family history of ischemic heart disease and other diseases of the circulatory system: Secondary | ICD-10-CM | POA: Diagnosis not present

## 2023-11-12 DIAGNOSIS — C50012 Malignant neoplasm of nipple and areola, left female breast: Secondary | ICD-10-CM | POA: Diagnosis not present

## 2023-11-12 DIAGNOSIS — Z7901 Long term (current) use of anticoagulants: Secondary | ICD-10-CM | POA: Diagnosis not present

## 2023-11-12 DIAGNOSIS — Z79811 Long term (current) use of aromatase inhibitors: Secondary | ICD-10-CM | POA: Diagnosis not present

## 2023-11-12 DIAGNOSIS — I5032 Chronic diastolic (congestive) heart failure: Secondary | ICD-10-CM

## 2023-11-12 DIAGNOSIS — Z79899 Other long term (current) drug therapy: Secondary | ICD-10-CM | POA: Diagnosis not present

## 2023-11-12 DIAGNOSIS — Z86718 Personal history of other venous thrombosis and embolism: Secondary | ICD-10-CM | POA: Diagnosis not present

## 2023-11-12 DIAGNOSIS — E669 Obesity, unspecified: Secondary | ICD-10-CM | POA: Insufficient documentation

## 2023-11-12 DIAGNOSIS — C50011 Malignant neoplasm of nipple and areola, right female breast: Secondary | ICD-10-CM

## 2023-11-12 DIAGNOSIS — Z9013 Acquired absence of bilateral breasts and nipples: Secondary | ICD-10-CM | POA: Diagnosis not present

## 2023-11-12 DIAGNOSIS — Z9221 Personal history of antineoplastic chemotherapy: Secondary | ICD-10-CM | POA: Insufficient documentation

## 2023-11-12 DIAGNOSIS — I110 Hypertensive heart disease with heart failure: Secondary | ICD-10-CM | POA: Insufficient documentation

## 2023-11-12 DIAGNOSIS — Z171 Estrogen receptor negative status [ER-]: Secondary | ICD-10-CM

## 2023-11-12 DIAGNOSIS — Z08 Encounter for follow-up examination after completed treatment for malignant neoplasm: Secondary | ICD-10-CM | POA: Insufficient documentation

## 2023-11-12 DIAGNOSIS — M25562 Pain in left knee: Secondary | ICD-10-CM | POA: Insufficient documentation

## 2023-11-12 DIAGNOSIS — Z853 Personal history of malignant neoplasm of breast: Secondary | ICD-10-CM | POA: Diagnosis not present

## 2023-11-12 DIAGNOSIS — M25561 Pain in right knee: Secondary | ICD-10-CM | POA: Insufficient documentation

## 2023-11-12 DIAGNOSIS — I440 Atrioventricular block, first degree: Secondary | ICD-10-CM | POA: Diagnosis not present

## 2023-11-12 DIAGNOSIS — R001 Bradycardia, unspecified: Secondary | ICD-10-CM | POA: Diagnosis not present

## 2023-11-12 LAB — LIPID PANEL
Cholesterol: 235 mg/dL — ABNORMAL HIGH (ref 0–200)
HDL: 90 mg/dL (ref >40)
LDL Cholesterol: 134 mg/dL — ABNORMAL HIGH (ref 0–99)
Total CHOL/HDL Ratio: 2.6 ratio
Triglycerides: 55 mg/dL (ref <150)
VLDL: 11 mg/dL (ref 0–40)

## 2023-11-12 LAB — ECHOCARDIOGRAM COMPLETE
AR max vel: 2.14 cm2
AV Area VTI: 2.1 cm2
AV Area mean vel: 2.05 cm2
AV Mean grad: 4 mmHg
AV Peak grad: 8.2 mmHg
Ao pk vel: 1.43 m/s
Area-P 1/2: 3.91 cm2
S' Lateral: 3.2 cm

## 2023-11-12 LAB — BASIC METABOLIC PANEL
Anion gap: 9 (ref 5–15)
BUN: 16 mg/dL (ref 8–23)
CO2: 30 mmol/L (ref 22–32)
Calcium: 9.1 mg/dL (ref 8.9–10.3)
Chloride: 100 mmol/L (ref 98–111)
Creatinine, Ser: 0.82 mg/dL (ref 0.44–1.00)
GFR, Estimated: >60 mL/min (ref >60)
Glucose, Bld: 105 mg/dL — ABNORMAL HIGH (ref 70–99)
Potassium: 3.7 mmol/L (ref 3.5–5.1)
Sodium: 139 mmol/L (ref 135–145)

## 2023-11-12 LAB — BRAIN NATRIURETIC PEPTIDE: B Natriuretic Peptide: 64.6 pg/mL (ref 0.0–100.0)

## 2023-11-12 MED ORDER — CARVEDILOL 12.5 MG PO TABS: 18.7500 mg | ORAL_TABLET | Freq: Two times a day (BID) | ORAL | 11 refills | Status: DC

## 2023-11-12 NOTE — Telephone Encounter (Signed)
 Advanced Heart Failure Patient Advocate Encounter  Patient contacted office with concerns about Eliquis pricing. Patient does not want to pursue Warfarin therapy as an option, as discussed in office.  Spoke with patient about 3% criteria for BMS approval, patient does not think that she will meet this criteria. Informed patient about max out of pocket limits for medicare in 2025, and provided information about the Medicare Prescription Payment Program. Pt expressed understanding.  Burnell Blanks, CPhT Rx Patient Advocate Phone: [phone]

## 2023-11-12 NOTE — Progress Notes (Addendum)
 Advanced Heart Failure Clinic Note   Patient ID: [name], female   DOB: [birth year], 73 y.o.   MRN: [id number] Oncologist: Pamelia Hoit  HPI: [name] is a 73 y.o. with a history of metastatic HER-2 positive breast carcinoma originally diagnosed September 2004. Started in R breast. Underwent neo-adjuvant chemo. During this time developed L breast CA. Underwent bilateral mastectomy. Lymph nodes +. 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   She received a total of 6 cycles of Taxotere/Carbo/Herceptin, completed in April 2005, after which she began single agent Herceptin, given every 4 weeks now. Plan to continue on Herceptin indefinitely.   Coronary CTA in 8/20 showed no significant CAD, calcium score 0.   Echo was reviewed today, EF 55-60% with mild LVH, GLS -19.4%, normal RV.   She returns for cardio-oncology followup.  She rarely uses Lasix.  Main complaint is bilateral knee pain, uses walker.  No significant dyspnea though not very active. No chest pain.  BP elevated.   ECG (personally reviewed): NSR, 1st degree AVB, LVH    Labs 11/15: K 4, creatinine 1.0   Labs 1/16: K 4.1, creatinine 1.0 Labs 8/16: K 4.3, creatinine 0.9 Labs 11/16: K 3.9, creatinine 0.9 Labs 5/17: K 3.9, creatinine 0.68 Labs 5/18: K 4, creatinine 0.65 Labs 1/19: K 4.1, creatinine 1.23 Labs 7/19: K 3.9, creatinine 1.11 Labs 1/20: K 3.8, creatinine 0.83 Labs 7/20: K 4.1, creatinine 1.18, LDL 112 Labs 3/21: K 4.2, creatinine 0.86 Labs 4/21: LDL 115, HDL 105 Labs 3/22: K 4.5, creatinine 0.86 Labs 9/22: K 4.3, creatinine 0.98, hgb 11.1 Labs 7/23: K 3.7, creatinine 1.0 Labs 2/24: K 3.6, creatinine 0.83 Labs 5/24: K 4.7, creatinine 0.82  04/23/12 ECHO EF 60-65% Lateral s' 8.9 cm/s  07/30/12 ECHO EF 60-65% Lateral s' 8.3 cm/s  09/11/12 ECHO EF 60-65% Lateral s' 10.3 cm/s  12/22/12 ECHO 55-60% Lateral S' 9.8 RV mildly dilated  07/01/13 ECHO 55-60%, lateral s' 9.79,  mild RV dilation, grade II DD 3/15 ECHO 55%, mild MR, lateral s' 9.6, GLS -19.2% 03/02/14 ECHO EF 55-60% Lateral S' 9.4 GLS - 21.9  11/15 ECHO EF 60-65%, lateral S' 6.8, GLS -17.2%, mild RV dilation with normal systolic function, mild MR.  2/16 ECHO EF 60-65%, lateral S' 14.4, GLS -23.5%, normal diastolic function, mild RV dilation with normal RV systolic function.  8/16 ECHO EF 60-65%, mild LVH, normal RV size and systolic function, lateral s' 78.2, GLS -17%.  11/16 ECHO EF 60-65% Lateral S' 10.2, GLS -20.2% 5/17 ECHO EF 60-65%, mild LVH, lateral s' 12.2, GLS -20.8%, grade II diastolic dysfunction, normal RV. 9/17 ECHO EF 55-60%, normal diastolic function, GLS -18.3% 95/62 ECHO EF 55-60%, moderate diastolic dysfunction, GLS -20.1%, normal RV size and systolic function 5/18 ECHO EF 55-60%, GLS -18.4%, normal diastolic function, normal RV size and systolic function.  1/19 ECHO EF 55-60%, GLS -18.9%, normal diastolic function, RV normal size and systolic function.  7/19 ECHO EF 60-65%, GLS -22.1%.  1/20 ECHO EF 60-65%, normal diastolic function, normal RV, strain not done.  6/20 ECHO EF 60-65%, normal diastolic function, normal RV, GLS -24.4% 4/21 ECHO EF 55-60%, normal diastolic function, normal RV, strain tracking was not accurate.  1/22 ECHO EF 60-65%, normal RV, moderate LAE, IVC normal.  9/22 ECHO EF 60-65%, normal RV 7/23 ECHO EF 55-60%, GLS -19.1%, normal RV, mild MR.  2/24 ECHO EF 55%, GLS -21.6%, normal RV 3/25 ECHO EF 55-60% with mild LVH, GLS -19.4%, normal RV.  ROS: All systems negative except as listed in HPI, PMH and Problem List.  Past Medical History:  Diagnosis Date   Anemia    pt pt report   Anxiety    Breast cancer (HCC)    mets to liver and lung   Breast cancer metastasized to multiple sites [facility]) 02/26/2013   Cellulitis    CHF (congestive heart failure) (HCC)    History of chemotherapy 09/2004   taxotere/herceptin/carboplatin   Hypertension    Neuropathy     Radiation 07/31/2006   left upper chest   Radiation 06/17/2006-06/27/2006   6480 cGy bilat. chest wall   SVC syndrome    Thrombosis     Current Outpatient Medications  Medication Sig Dispense Refill   acetaminophen (TYLENOL) 500 MG tablet Take 1,000 mg by mouth every 6 (six) hours as needed for mild pain or fever.      albuterol (VENTOLIN HFA) 108 (90 Base) MCG/ACT inhaler Inhale 2 puffs into the lungs every 6 (six) hours as needed for wheezing. 1 each 6   ALPRAZolam (XANAX) 1 MG tablet Take 1 tablet (1 mg total) by mouth 3 (three) times daily as needed for anxiety. 60 tablet 3   amLODipine (NORVASC) 10 MG tablet Take 1 tablet (10 mg total) by mouth every morning. 30 tablet 4   apixaban (ELIQUIS) 5 MG TABS tablet Take 1 tablet (5 mg total) by mouth 2 (two) times daily. 180 tablet 3   baclofen (LIORESAL) 10 MG tablet TAKE 1 TABLET BY MOUTH THREE TIMES DAILY AS NEEDED FOR MUSCLE SPASMS 270 tablet 0   diclofenac sodium (VOLTAREN) 1 % GEL Apply 2 g topically daily as needed (for pain). Apply to knees and shoulders 100 g 6   DULoxetine (CYMBALTA) 60 MG capsule Take 60 mg by mouth daily.     furosemide (LASIX) 40 MG tablet Take 40 mg by mouth as needed.     gabapentin (NEURONTIN) 300 MG capsule TAKE 2 CAPSULES BY MOUTH THREE TIMES DAILY 540 capsule 1   losartan (COZAAR) 100 MG tablet Take 100 mg by mouth every morning.     Multiple Vitamins-Minerals (ZINC PO) Take by mouth daily.     ondansetron (ZOFRAN) 8 MG tablet Take 1 tablet (8 mg total) by mouth every 8 (eight) hours as needed for nausea or vomiting. 20 tablet 1   oxyCODONE-acetaminophen (PERCOCET) 10-325 MG tablet Take 1 tablet by mouth 4 (four) times daily as needed.     potassium chloride SA (KLOR-CON) 20 MEQ tablet Take 1 tablet (20 mEq total) by mouth daily as needed. With use of lasix 30 tablet 3   predniSONE (DELTASONE) 5 MG tablet Take 5 mg by mouth daily in the afternoon.     spironolactone (ALDACTONE) 25 MG tablet Take 1 tablet  daily 90 tablet 3   temazepam (RESTORIL) 30 MG capsule Take 1 capsule (30 mg total) by mouth at bedtime. 30 capsule 4   carvedilol (COREG) 12.5 MG tablet Take 1.5 tablets (18.75 mg total) by mouth 2 (two) times daily with a meal. 90 tablet 11   No current facility-administered medications for this encounter.   Facility-Administered Medications Ordered in Other Encounters  Medication Dose Route Frequency Provider Last Rate Last Admin   sodium chloride flush (NS) 0.9 % injection 10 mL  10 mL Intravenous PRN Magrinat, Valentino Hue, MD   10 mL at 12/15/15 1200   sodium chloride flush (NS) 0.9 % injection 10 mL  10 mL Intracatheter PRN Magrinat, Valentino Hue, MD  10 mL at 08/14/18 1116   sodium chloride flush (NS) 0.9 % injection 10 mL  10 mL Intracatheter PRN Magrinat, Valentino Hue, MD        Vitals:   11/12/23 1349  BP: (!) 162/90  Pulse: 63  SpO2: 91%  Weight: 114.9 kg (253 lb 6.4 oz)    PHYSICAL EXAM: General: NAD Neck: No JVD, no thyromegaly or thyroid nodule.  Lungs: Clear to auscultation bilaterally with normal respiratory effort. CV: Nondisplaced PMI.  Heart regular S1/S2, no S3/S4, no murmur.  No peripheral edema.  No carotid bruit.  Normal pedal pulses.  Abdomen: Soft, nontender, no hepatosplenomegaly, no distention.  Skin: Intact without lesions or rashes.  Neurologic: Alert and oriented x 3.  Psych: Normal affect. Extremities: No clubbing or cyanosis.  HEENT: Normal.   ASSESSMENT & PLAN: 1) L Breast Cancer s/p bilateral mastectomies:  Symptomatically stable. I reviewed today's echo, EF and strain are stable. She will be continuing Herceptin indefinitely.   - Continue echo screening but have increased the interval given stability over a number of years.  I will repeat echo in 9 months.     2) Suspected OSA: She has not wanted to do a sleep study.    3) HTN: BP elevated recently.  - Increase Coreg to 18.75 mg bid.      4) Chronic diastolic CHF: She is not volume overloaded on exam,  NYHA class I-II.    - Can continue to take Lasix prn.   5) SVC syndrome: She has been transitioned from warfarin to Eliquis.   6) Coronary disease risk: She has a strong FH of coronary disease, as well as HTN.  Coronary CTA in 8/20 showed no significant coronary disease and calcium score 0.  - No ASA given Eliquis use.  7) Obesity: I will refer to pharmacy clinic for GLP-1 agonist.   Followup 9 months with echo.   I spent 31 minutes reviewing records, interviewing/examining patient, and managing orders.    Marca Ancona 11/12/2023

## 2023-11-12 NOTE — Patient Instructions (Addendum)
 Increase Coreg to 18.75 mg twice daily. Rx sent. EKG obtained today. Labs today - will call you if abnormal. Will ask PharmD about assistance with Eliquis or alternative. Referral sent to PharmD Clinic for Ozympic. They will call you with your first appointment - see below. Return to see Dr. Shirlee Latch in 9 months with echo. Please call [phone] in November to schedule this appointment. Please call us at [phone] if any questions or concerns prior to your next appointment.

## 2023-11-14 ENCOUNTER — Telehealth (HOSPITAL_COMMUNITY): Payer: Self-pay

## 2023-11-14 ENCOUNTER — Ambulatory Visit (HOSPITAL_COMMUNITY)
Admission: RE | Admit: 2023-11-14 | Discharge: 2023-11-14 | Disposition: A | Discharge status: Home / Self Care | Payer: Medicare Other | Source: Ambulatory Visit | Attending: Hematology and Oncology | Admitting: Hematology and Oncology

## 2023-11-14 DIAGNOSIS — D259 Leiomyoma of uterus, unspecified: Secondary | ICD-10-CM | POA: Diagnosis not present

## 2023-11-14 DIAGNOSIS — C50911 Malignant neoplasm of unspecified site of right female breast: Secondary | ICD-10-CM | POA: Insufficient documentation

## 2023-11-14 DIAGNOSIS — N281 Cyst of kidney, acquired: Secondary | ICD-10-CM | POA: Diagnosis not present

## 2023-11-14 MED ORDER — ROSUVASTATIN CALCIUM 10 MG PO TABS: 10.0000 mg | ORAL_TABLET | Freq: Every day | ORAL | 11 refills | Status: AC

## 2023-11-14 MED ORDER — IOHEXOL 300 MG/ML  SOLN
100.0000 mL | Freq: Once | INTRAMUSCULAR | Status: AC | PRN
  Administered 2023-11-14: 100 mL via INTRAVENOUS

## 2023-11-14 MED ORDER — SODIUM CHLORIDE (PF) 0.9 % IJ SOLN
INTRAMUSCULAR | Status: AC
  Filled 2023-11-14: qty 50

## 2023-11-14 NOTE — Telephone Encounter (Signed)
 Patient advised and verbalized understanding. New Rx sent into patients pharmacy. Patient will callback to schedule lab appointment.  Meds ordered this encounter  Medications   rosuvastatin (CRESTOR) 10 MG tablet    Sig: Take 1 tablet (10 mg total) by mouth daily.    Dispense:  30 tablet    Refill:  11

## 2023-11-14 NOTE — Telephone Encounter (Signed)
-----   Message from Marca Ancona sent at 11/13/2023  2:36 PM EDT ----- Cholesterol is a little higher.  Would recommend rosuvastatin 10 mg daily.  Lipids/LFTs in 2 months.

## 2023-11-15 ENCOUNTER — Other Ambulatory Visit: Payer: Self-pay

## 2023-11-20 ENCOUNTER — Encounter: Payer: Self-pay | Admitting: *Deleted

## 2023-11-20 ENCOUNTER — Inpatient Hospital Stay: Payer: Medicare Other | Attending: Oncology | Admitting: Hematology and Oncology

## 2023-11-20 ENCOUNTER — Inpatient Hospital Stay: Payer: Medicare Other

## 2023-11-20 VITALS — BP 144/75 | HR 66 | Temp 97.9°F | Resp 18 | Ht 63.0 in | Wt 252.2 lb

## 2023-11-20 DIAGNOSIS — C50911 Malignant neoplasm of unspecified site of right female breast: Secondary | ICD-10-CM

## 2023-11-20 DIAGNOSIS — Z171 Estrogen receptor negative status [ER-]: Secondary | ICD-10-CM | POA: Insufficient documentation

## 2023-11-20 DIAGNOSIS — C50812 Malignant neoplasm of overlapping sites of left female breast: Secondary | ICD-10-CM | POA: Diagnosis not present

## 2023-11-20 DIAGNOSIS — C787 Secondary malignant neoplasm of liver and intrahepatic bile duct: Secondary | ICD-10-CM | POA: Insufficient documentation

## 2023-11-20 DIAGNOSIS — C7800 Secondary malignant neoplasm of unspecified lung: Secondary | ICD-10-CM | POA: Insufficient documentation

## 2023-11-20 DIAGNOSIS — C7951 Secondary malignant neoplasm of bone: Secondary | ICD-10-CM | POA: Diagnosis not present

## 2023-11-20 DIAGNOSIS — Z7901 Long term (current) use of anticoagulants: Secondary | ICD-10-CM | POA: Insufficient documentation

## 2023-11-20 DIAGNOSIS — Z5112 Encounter for antineoplastic immunotherapy: Secondary | ICD-10-CM | POA: Insufficient documentation

## 2023-11-20 MED ORDER — HEPARIN SOD (PORK) LOCK FLUSH 100 UNIT/ML IV SOLN
500.0000 [IU] | Freq: Once | INTRAVENOUS | Status: AC | PRN
Start: 2023-11-20 — End: 2023-11-20
  Administered 2023-11-20: 500 [IU]

## 2023-11-20 MED ORDER — LORAZEPAM 2 MG/ML IJ SOLN
1.0000 mg | Freq: Once | INTRAMUSCULAR | Status: AC
  Administered 2023-11-20: 1 mg via INTRAVENOUS
  Filled 2023-11-20: qty 1

## 2023-11-20 MED ORDER — SODIUM CHLORIDE 0.9% FLUSH
10.0000 mL | INTRAVENOUS | Status: DC | PRN
  Administered 2023-11-20: 10 mL

## 2023-11-20 MED ORDER — SODIUM CHLORIDE 0.9 % IV SOLN
750.0000 mg | Freq: Once | INTRAVENOUS | Status: AC
  Administered 2023-11-20: 750 mg via INTRAVENOUS
  Filled 2023-11-20: qty 35.71

## 2023-11-20 MED ORDER — DIPHENHYDRAMINE HCL 25 MG PO CAPS
50.0000 mg | ORAL_CAPSULE | Freq: Once | ORAL | Status: AC
  Administered 2023-11-20: 50 mg via ORAL
  Filled 2023-11-20: qty 2

## 2023-11-20 MED ORDER — SODIUM CHLORIDE 0.9 % IV SOLN: Freq: Once | INTRAVENOUS | Status: AC

## 2023-11-20 MED ORDER — ACETAMINOPHEN 325 MG PO TABS
650.0000 mg | ORAL_TABLET | Freq: Once | ORAL | Status: AC
  Administered 2023-11-20: 650 mg via ORAL
  Filled 2023-11-20: qty 2

## 2023-11-20 NOTE — Patient Instructions (Signed)

## 2023-11-20 NOTE — Progress Notes (Signed)
 Patient Care Team: Yolanda Kingdom, MD as PCP - General (Internal Medicine) Ranae Pila., MD as Consulting Physician (Pain Medicine) Linda Hedges, MD as Referring Physician (Orthopedic Surgery) Carnella Guadalajara, DO as Consulting Physician (Orthopedic Surgery) Laurey Morale, MD as Consulting Physician (Cardiology) Bensimhon, Bevelyn Buckles, MD as Consulting Physician (Cardiology) Althea Charon, MD (Radiology) Daiva Eves, Lisette Grinder, MD as Consulting Physician (Infectious Diseases) Antony Blackbird, MD as Consulting Physician (Radiation Oncology) Serena Croissant, MD as Consulting Physician (Hematology and Oncology)  DIAGNOSIS:  Encounter Diagnosis  Name Primary?   Carcinoma of right breast metastatic to multiple sites [facility]) Yes    SUMMARY OF ONCOLOGIC HISTORY: Oncology History  Breast cancer metastasized to multiple sites [facility])  09/05/2012 - 09/27/2022 Chemotherapy   Patient is on Treatment Plan : BREAST Trastuzumab q28d     02/26/2013 Initial Diagnosis   history of inflammatory right breast cancer metastatic at presentation September 2004 with involvement of liver and bone, HER-2 positive, estrogen and progesterone receptor negative      - 11/2013 Chemotherapy   carboplatin, docetaxel and Herceptin x 6 completed April 2005     Surgery   bilateral mastectomies with bilateral axillary lymph node dissection 12/07/2004, showing             (a) on the right, a mypT1c ypN1 invasive ductal carcinoma, grade 3, estrogen and progesterone receptor negative, HER-2 positive, with an MIB-1 of 31%             (b) on the left, ypT2 ypN1 invasive ductal carcinoma, grade 2, estrogen and progesterone receptor negative, HER-2 positive, with an MIB-1 of 35%.   01/2015 - 02/2015 Radiation Therapy   Adj XRT    - 03/2018 Chemotherapy   Ixampra   10/25/2022 -  Chemotherapy   Patient is on Treatment Plan : BREAST Trastuzumab IV (8/6) or SQ (600) D1 q21d       CHIEF COMPLIANT: Follow-up on  Herceptin  HISTORY OF PRESENT ILLNESS:  History of Present Illness The patient, with a history of requiring blood thinners, presents with concerns about the cost of her medications. She expresses a strong desire to avoid returning to warfarin due to the inconvenience of regular monitoring and dietary restrictions. She mentions that one of the medications was affordable at $47 per month, but she cannot recall which one. The patient also mentions a previous discussion about weight loss therapy, but it is unclear if this is a current concern. The patient is awaiting the results of a recent CT scan.     ALLERGIES:  is allergic to penicillins and adhesive [tape].  MEDICATIONS:  Current Outpatient Medications  Medication Sig Dispense Refill   acetaminophen (TYLENOL) 500 MG tablet Take 1,000 mg by mouth every 6 (six) hours as needed for mild pain or fever.      albuterol (VENTOLIN HFA) 108 (90 Base) MCG/ACT inhaler Inhale 2 puffs into the lungs every 6 (six) hours as needed for wheezing. 1 each 6   ALPRAZolam (XANAX) 1 MG tablet Take 1 tablet (1 mg total) by mouth 3 (three) times daily as needed for anxiety. 60 tablet 3   amLODipine (NORVASC) 10 MG tablet Take 1 tablet (10 mg total) by mouth every morning. 30 tablet 4   apixaban (ELIQUIS) 5 MG TABS tablet Take 1 tablet (5 mg total) by mouth 2 (two) times daily. 180 tablet 3   baclofen (LIORESAL) 10 MG tablet TAKE 1 TABLET BY MOUTH THREE TIMES DAILY AS NEEDED FOR MUSCLE SPASMS 270  tablet 0   carvedilol (COREG) 12.5 MG tablet Take 1.5 tablets (18.75 mg total) by mouth 2 (two) times daily with a meal. 90 tablet 11   diclofenac sodium (VOLTAREN) 1 % GEL Apply 2 g topically daily as needed (for pain). Apply to knees and shoulders 100 g 6   DULoxetine (CYMBALTA) 60 MG capsule Take 60 mg by mouth daily.     furosemide (LASIX) 40 MG tablet Take 40 mg by mouth as needed.     gabapentin (NEURONTIN) 300 MG capsule TAKE 2 CAPSULES BY MOUTH THREE TIMES DAILY 540  capsule 1   losartan (COZAAR) 100 MG tablet Take 100 mg by mouth every morning.     Multiple Vitamins-Minerals (ZINC PO) Take by mouth daily.     ondansetron (ZOFRAN) 8 MG tablet Take 1 tablet (8 mg total) by mouth every 8 (eight) hours as needed for nausea or vomiting. 20 tablet 1   oxyCODONE-acetaminophen (PERCOCET) 10-325 MG tablet Take 1 tablet by mouth 4 (four) times daily as needed.     potassium chloride SA (KLOR-CON) 20 MEQ tablet Take 1 tablet (20 mEq total) by mouth daily as needed. With use of lasix 30 tablet 3   predniSONE (DELTASONE) 5 MG tablet Take 5 mg by mouth daily in the afternoon.     rosuvastatin (CRESTOR) 10 MG tablet Take 1 tablet (10 mg total) by mouth daily. 30 tablet 11   spironolactone (ALDACTONE) 25 MG tablet Take 1 tablet daily 90 tablet 3   temazepam (RESTORIL) 30 MG capsule Take 1 capsule (30 mg total) by mouth at bedtime. 30 capsule 4   No current facility-administered medications for this visit.   Facility-Administered Medications Ordered in Other Visits  Medication Dose Route Frequency Provider Last Rate Last Admin   sodium chloride flush (NS) 0.9 % injection 10 mL  10 mL Intravenous PRN Magrinat, Valentino Hue, MD   10 mL at 12/15/15 1200   sodium chloride flush (NS) 0.9 % injection 10 mL  10 mL Intracatheter PRN Magrinat, Valentino Hue, MD   10 mL at 08/14/18 1116   sodium chloride flush (NS) 0.9 % injection 10 mL  10 mL Intracatheter PRN Magrinat, Valentino Hue, MD        PHYSICAL EXAMINATION: ECOG PERFORMANCE STATUS: 1 - Symptomatic but completely ambulatory  Vitals:   11/20/23 0830  BP: (!) 144/75  Pulse: 66  Resp: 18  Temp: 97.9 F (36.6 C)  SpO2: 98%   Filed Weights   11/20/23 0830  Weight: 252 lb 3.2 oz (114.4 kg)      LABORATORY DATA:  I have reviewed the data as listed    Latest Ref Rng & Units 11/12/2023    2:28 PM 05/02/2023   11:33 AM 01/16/2023    8:24 AM  CMP  Glucose 70 - 99 mg/dL 161   94   BUN 8 - 23 mg/dL 16   17   Creatinine 0.96 -  1.00 mg/dL 0.45  4.09  8.11   Sodium 135 - 145 mmol/L 139   142   Potassium 3.5 - 5.1 mmol/L 3.7   4.7   Chloride 98 - 111 mmol/L 100   106   CO2 22 - 32 mmol/L 30   30   Calcium 8.9 - 10.3 mg/dL 9.1   8.7   Total Protein 6.5 - 8.1 g/dL   6.8   Total Bilirubin 0.3 - 1.2 mg/dL   0.7   Alkaline Phos 38 - 126 U/L   79  AST 15 - 41 U/L   10   ALT 0 - 44 U/L   8     Lab Results  Component Value Date   WBC 8.1 01/16/2023   HGB 10.7 (L) 01/16/2023   HCT 31.4 (L) 01/16/2023   MCV 82.8 01/16/2023   PLT 261 01/16/2023   NEUTROABS 5.3 01/16/2023    ASSESSMENT & PLAN:  Breast cancer metastasized to multiple sites 2004: Liver. Lung and bone mets Neoadj chemo TCH X 6 completed April 2005 foll by herceptin maintenance   bilateral mastectomies with bilateral axillary lymph node dissection 12/07/2004, showing             (a) on the right, a mypT1c ypN1 invasive ductal carcinoma, grade 3, estrogen and progesterone receptor negative, HER-2 positive, with an MIB-1 of 31%             (b) on the left, ypT2 ypN1 invasive ductal carcinoma, grade 2, estrogen and progesterone receptor negative, HER-2 positive, with an MIB-1 of 35%.   Adj XRT ----------------------------------------------------------------------------------------------------------------------------------------  Current treatment: Herceptin maintenance therapy Plan to continue Herceptin indefinitely every 28 days.  SVC syndrome: On lifelong anticoagulation.  (Because of cost issues we may be switching her from Eliquis to Pradaxa.  I gave her additional information about Pradaxa and the cost)   Bone scan 09/27/2022: No bone metastases ECHO: 10/31/22: EF 55% (Dr. Shirlee Latch is doing her echocardiograms now every 9 months) CT CAP 05/05/2023: No evidence of recurrent or metastatic disease in chest abdomen pelvis CT CAP 11/04/2023: CT scan has not been read.  Cost of treatment: Patient is not able to afford her medications and is having a very  difficult time.  I will see if she could qualify for the [employer]. Continue monthly Herceptin infusions.   No orders of the defined types were placed in this encounter.  The patient has a good understanding of the overall plan. she agrees with it. she will call with any problems that may develop before the next visit here. Total time spent: 30 mins including face to face time and time spent for planning, charting and co-ordination of care   Tamsen Meek, MD 11/20/23

## 2023-11-20 NOTE — Assessment & Plan Note (Signed)
 2004: Liver. Lung and bone mets Neoadj chemo TCH X 6 completed April 2005 foll by herceptin maintenance   bilateral mastectomies with bilateral axillary lymph node dissection 12/07/2004, showing             (a) on the right, a mypT1c ypN1 invasive ductal carcinoma, grade 3, estrogen and progesterone receptor negative, HER-2 positive, with an MIB-1 of 31%             (b) on the left, ypT2 ypN1 invasive ductal carcinoma, grade 2, estrogen and progesterone receptor negative, HER-2 positive, with an MIB-1 of 35%.   Adj XRT ----------------------------------------------------------------------------------------------------------------------------------------  Current treatment: Herceptin maintenance therapy Plan to continue Herceptin indefinitely every 28 days.  SVC syndrome: On lifelong anticoagulation.  (Because of cost issues we may be switching her from Eliquis to Pradaxa.  I gave her additional information about Pradaxa and the cost)   Bone scan 09/27/2022: No bone metastases ECHO: 10/31/22: EF 55% (Dr. Shirlee Latch is doing her echocardiograms now every 9 months) CT CAP 05/05/2023: No evidence of recurrent or metastatic disease in chest abdomen pelvis CT CAP 11/04/2023:

## 2023-11-29 ENCOUNTER — Other Ambulatory Visit (HOSPITAL_COMMUNITY): Payer: Self-pay

## 2023-11-29 ENCOUNTER — Ambulatory Visit: Attending: Internal Medicine | Admitting: Pharmacist

## 2023-11-29 ENCOUNTER — Encounter: Payer: Self-pay | Admitting: Hematology and Oncology

## 2023-11-29 ENCOUNTER — Telehealth: Payer: Self-pay | Admitting: Pharmacy Technician

## 2023-11-29 ENCOUNTER — Telehealth: Payer: Self-pay | Admitting: Pharmacist

## 2023-11-29 ENCOUNTER — Encounter: Payer: Self-pay | Admitting: Pharmacist

## 2023-11-29 ENCOUNTER — Encounter: Payer: Self-pay | Admitting: Oncology

## 2023-11-29 VITALS — Wt 252.0 lb

## 2023-11-29 DIAGNOSIS — E1100 Type 2 diabetes mellitus with hyperosmolarity without nonketotic hyperglycemic-hyperosmolar coma (NKHHC): Secondary | ICD-10-CM | POA: Diagnosis not present

## 2023-11-29 DIAGNOSIS — E669 Obesity, unspecified: Secondary | ICD-10-CM | POA: Diagnosis not present

## 2023-11-29 NOTE — Progress Notes (Signed)
 Patient ID: MARGET OUTTEN                 DOB: May 20, 1951                    MRN: [id number]     HPI: DLISA BARNWELL is a 73 y.o. female patient referred to pharmacy clinic by Dr.McLean to initiate GLP1-RA therapy. PMH is significant for hypertension, CHF, peripheral neuropathy,breast cancer metastasize multiple sites , and obesity. Most recent BMI 44.69 kg/m .  Baseline weight and BMI: 254 lbs 44.98 kg/m  Current weight and BMI: 252 lbs 44.69 kg/m  Current meds that affect weight: none  Goal weight 220 lb  A1c never checked, we discussed common co morbidities that insurances consider the cover Wegovy and Zepbound. Patient denies she had hx of stroke or MI or PAD Previously patient had denied to go for sleep study but now she is open to get done if AHI> 15 or meet  moderate to severe sleep apnea criteria will attempt Zepbound PA. We will check A1c today.   Diet:  Breakfast: large coffee - black  Middle of the day - large salads with creamy dressing, burger  Fried chicken, fried fish  Dinner:  fruits salads cottage cheese  Snack: trail mix  Drink: water cranberry juice, green tea and various other herbal teas    Exercise:  None    Family History:  Relation Problem Comments  Mother (Deceased)   Father (Deceased) Cancer Prostate cancer  Heart failure     Brother - Games developer (Deceased) Cancer Prostate cancer  Heart failure     Maternal Aunt - Grady (Deceased) Diabetes    Grandfather - HF  Social History:  Alcohol: 3 drinks per month  Smoking: none  Labs: No results found for: "HGBA1C"  Wt Readings from Last 1 Encounters:  11/20/23 252 lb 3.2 oz (114.4 kg)    BP Readings from Last 1 Encounters:  11/20/23 (!) 144/75   Pulse Readings from Last 1 Encounters:  11/20/23 66       Component Value Date/Time   CHOL 235 (H) 11/12/2023 1428   TRIG 55 11/12/2023 1428   HDL 90 11/12/2023 1428   CHOLHDL 2.6 11/12/2023 1428   VLDL 11 11/12/2023 1428   LDLCALC 134 (H)  11/12/2023 1428    Past Medical History:  Diagnosis Date   Anemia    pt pt report   Anxiety    Breast cancer (HCC)    mets to liver and lung   Breast cancer metastasized to multiple sites (HCC) 02/26/2013   Cellulitis    CHF (congestive heart failure) (HCC)    History of chemotherapy 09/2004   taxotere/herceptin/carboplatin   Hypertension    Neuropathy    Radiation 07/31/2006   left upper chest   Radiation 06/17/2006-06/27/2006   6480 cGy bilat. chest wall   SVC syndrome    Thrombosis     Current Outpatient Medications on File Prior to Visit  Medication Sig Dispense Refill   acetaminophen (TYLENOL) 500 MG tablet Take 1,000 mg by mouth every 6 (six) hours as needed for mild pain or fever.      albuterol (VENTOLIN HFA) 108 (90 Base) MCG/ACT inhaler Inhale 2 puffs into the lungs every 6 (six) hours as needed for wheezing. 1 each 6   ALPRAZolam (XANAX) 1 MG tablet Take 1 tablet (1 mg total) by mouth 3 (three) times daily as needed for anxiety. 60 tablet 3   amLODipine (NORVASC)  10 MG tablet Take 1 tablet (10 mg total) by mouth every morning. 30 tablet 4   apixaban (ELIQUIS) 5 MG TABS tablet Take 1 tablet (5 mg total) by mouth 2 (two) times daily. 180 tablet 3   baclofen (LIORESAL) 10 MG tablet TAKE 1 TABLET BY MOUTH THREE TIMES DAILY AS NEEDED FOR MUSCLE SPASMS 270 tablet 0   carvedilol (COREG) 12.5 MG tablet Take 1.5 tablets (18.75 mg total) by mouth 2 (two) times daily with a meal. 90 tablet 11   diclofenac sodium (VOLTAREN) 1 % GEL Apply 2 g topically daily as needed (for pain). Apply to knees and shoulders 100 g 6   DULoxetine (CYMBALTA) 60 MG capsule Take 60 mg by mouth daily.     furosemide (LASIX) 40 MG tablet Take 40 mg by mouth as needed.     gabapentin (NEURONTIN) 300 MG capsule TAKE 2 CAPSULES BY MOUTH THREE TIMES DAILY 540 capsule 1   losartan (COZAAR) 100 MG tablet Take 100 mg by mouth every morning.     Multiple Vitamins-Minerals (ZINC PO) Take by mouth daily.      ondansetron (ZOFRAN) 8 MG tablet Take 1 tablet (8 mg total) by mouth every 8 (eight) hours as needed for nausea or vomiting. 20 tablet 1   oxyCODONE-acetaminophen (PERCOCET) 10-325 MG tablet Take 1 tablet by mouth 4 (four) times daily as needed.     potassium chloride SA (KLOR-CON) 20 MEQ tablet Take 1 tablet (20 mEq total) by mouth daily as needed. With use of lasix 30 tablet 3   predniSONE (DELTASONE) 5 MG tablet Take 5 mg by mouth daily in the afternoon.     rosuvastatin (CRESTOR) 10 MG tablet Take 1 tablet (10 mg total) by mouth daily. 30 tablet 11   spironolactone (ALDACTONE) 25 MG tablet Take 1 tablet daily 90 tablet 3   temazepam (RESTORIL) 30 MG capsule Take 1 capsule (30 mg total) by mouth at bedtime. 30 capsule 4   Current Facility-Administered Medications on File Prior to Visit  Medication Dose Route Frequency Provider Last Rate Last Admin   sodium chloride flush (NS) 0.9 % injection 10 mL  10 mL Intravenous PRN Magrinat, Valentino Hue, MD   10 mL at 12/15/15 1200   sodium chloride flush (NS) 0.9 % injection 10 mL  10 mL Intracatheter PRN Magrinat, Valentino Hue, MD   10 mL at 08/14/18 1116   sodium chloride flush (NS) 0.9 % injection 10 mL  10 mL Intracatheter PRN Magrinat, Valentino Hue, MD        Allergies  Allergen Reactions   Penicillins Hives    PATIENT HAS TOLERATED CEPHALOSPORINGS   Adhesive [Tape] Other (See Comments)    Tears skin      Assessment/Plan:  1. Weight loss - Patient has not met goal of at least 5% of body weight loss with comprehensive lifestyle modifications alone in the past 3-6 months. Pharmacotherapy is appropriate to pursue as augmentation. Will start [facility] coverage assessment. Confirmed patient has no personal or family history of medullary thyroid carcinoma (MTC) or Multiple Endocrine Neoplasia syndrome type 2 (MEN 2). Injection technique reviewed at today's visit.  Advised patient on common side effects including nausea, diarrhea, dyspepsia, decreased appetite,  and fatigue. Counseled patient on reducing meal size and how to titrate medication to minimize side effects. Counseled patient to call if intolerable side effects or if experiencing dehydration, abdominal pain, or dizziness. Patient will adhere to dietary modifications and will target at least 150 minutes of moderate  intensity exercise weekly.   Follow up in 1 month via telephone for tolerability update and dose titration.

## 2023-11-29 NOTE — Telephone Encounter (Signed)
 Pharmacy Patient Advocate Encounter   Received notification from Pt Calls Messages that prior authorization for [facility] is required/requested.   Insurance verification completed.   The patient is insured through [employer] .   Per test claim: PA required; PA submitted to above mentioned insurance via CoverMyMeds Key/confirmation #/EOC [id number] Status is pending

## 2023-11-29 NOTE — Patient Instructions (Signed)
GLP1 Agonist Titration Plan:  Will plan to follow the titration plan as below, pending patient is tolerating each dose before increasing to the next. Can slow titration if needed for tolerability.   Wegovy:  -Month 1: Inject Wegovy 0.25 mg once weekly x 4 weeks -Month 2: Inject Wegovy 0.5 mg once weekly x 4 weeks -Month 3: Inject Wegovy 1 mg once weekly x 4 weeks -Month 4: Inject Wegovy 1.7mg SQ once weekly x 4 weeks -Month 5+: Inject Wegovy 2.4mg SQ once weekly     

## 2023-11-30 LAB — HEMOGLOBIN A1C
Est. average glucose Bld gHb Est-mCnc: 108 mg/dL
Hgb A1c MFr Bld: 5.4 % (ref 4.8–5.6)

## 2023-12-02 ENCOUNTER — Encounter: Payer: Self-pay | Admitting: Pharmacist

## 2023-12-02 NOTE — Telephone Encounter (Signed)
Please arrange for sleep study

## 2023-12-02 NOTE — Telephone Encounter (Signed)
 Pharmacy Patient Advocate Encounter  Received notification from [facility] that Prior Authorization for wegovy has been DENIED.  See denial reason below. No denial letter attached in CMM. Will attach denial letter to Media tab once received.   PA #/Case ID/Reference #: [id number]

## 2023-12-09 ENCOUNTER — Other Ambulatory Visit (HOSPITAL_COMMUNITY): Payer: Self-pay

## 2023-12-10 DIAGNOSIS — Z6841 Body Mass Index (BMI) 40.0 and over, adult: Secondary | ICD-10-CM | POA: Diagnosis not present

## 2023-12-10 DIAGNOSIS — I10 Essential (primary) hypertension: Secondary | ICD-10-CM | POA: Diagnosis not present

## 2023-12-10 DIAGNOSIS — M170 Bilateral primary osteoarthritis of knee: Secondary | ICD-10-CM | POA: Diagnosis not present

## 2023-12-10 DIAGNOSIS — C50911 Malignant neoplasm of unspecified site of right female breast: Secondary | ICD-10-CM | POA: Diagnosis not present

## 2023-12-10 NOTE — Telephone Encounter (Signed)
 No answer, Left message to return call. Patient doesn't need a pre cert for either sleep study exam

## 2023-12-11 ENCOUNTER — Telehealth: Payer: Self-pay | Admitting: *Deleted

## 2023-12-11 NOTE — Telephone Encounter (Signed)
 LMOM

## 2023-12-11 NOTE — Telephone Encounter (Signed)
 Received VM from pt requesting recent CT be faxed to PCP Dr. Kathyrn Parkinson.  RN successfully faxed results.

## 2023-12-17 ENCOUNTER — Telehealth: Payer: Self-pay | Admitting: Hematology and Oncology

## 2023-12-17 NOTE — Telephone Encounter (Signed)
 Confirmed with pt scheduled appt dates and times

## 2023-12-18 ENCOUNTER — Other Ambulatory Visit: Payer: Self-pay

## 2023-12-18 ENCOUNTER — Inpatient Hospital Stay: Admitting: Hematology and Oncology

## 2023-12-18 ENCOUNTER — Inpatient Hospital Stay: Payer: Medicare Other

## 2023-12-19 ENCOUNTER — Other Ambulatory Visit: Payer: Self-pay

## 2023-12-20 NOTE — Telephone Encounter (Signed)
 [facility] PA denied, suggested sleep study to see if she would be eligible for Zepbound for OSA and weight loss.

## 2023-12-23 DIAGNOSIS — M25552 Pain in left hip: Secondary | ICD-10-CM | POA: Diagnosis not present

## 2023-12-23 DIAGNOSIS — M549 Dorsalgia, unspecified: Secondary | ICD-10-CM | POA: Diagnosis not present

## 2023-12-25 ENCOUNTER — Inpatient Hospital Stay: Admitting: Hematology and Oncology

## 2023-12-25 ENCOUNTER — Other Ambulatory Visit: Payer: Self-pay | Admitting: Pharmacist

## 2023-12-25 ENCOUNTER — Telehealth: Payer: Self-pay | Admitting: Hematology and Oncology

## 2023-12-25 ENCOUNTER — Encounter: Payer: Self-pay | Admitting: Hematology and Oncology

## 2023-12-25 ENCOUNTER — Inpatient Hospital Stay

## 2023-12-25 ENCOUNTER — Telehealth: Payer: Self-pay | Admitting: *Deleted

## 2023-12-25 DIAGNOSIS — C50911 Malignant neoplasm of unspecified site of right female breast: Secondary | ICD-10-CM

## 2023-12-25 NOTE — Assessment & Plan Note (Deleted)
 2004: Liver. Lung and bone mets Neoadj chemo TCH X 6 completed April 2005 foll by herceptin  maintenance   bilateral mastectomies with bilateral axillary lymph node dissection 12/07/2004, showing             (a) on the right, a mypT1c ypN1 invasive ductal carcinoma, grade 3, estrogen and progesterone receptor negative, HER-2 positive, with an MIB-1 of 31%             (b) on the left, ypT2 ypN1 invasive ductal carcinoma, grade 2, estrogen and progesterone receptor negative, HER-2 positive, with an MIB-1 of 35%.   Adj XRT ----------------------------------------------------------------------------------------------------------------------------------------  Current treatment: Herceptin  maintenance therapy Plan to continue Herceptin  indefinitely every 28 days.   SVC syndrome: On lifelong anticoagulation.  (Because of cost issues we may be switching her from Eliquis  to Pradaxa.  I gave her additional information about Pradaxa and the cost)   Bone scan 09/27/2022: No bone metastases ECHO: 10/31/22: EF 55% (Dr. Mitzie Anda is doing her echocardiograms now every 9 months) CT CAP 05/05/2023: No evidence of recurrent or metastatic disease in chest abdomen pelvis CT CAP 11/04/2023: No evidence of metastatic disease healing sternal, right 5th and 6th rib fractures  Cost of treatment issues:  Return to clinic monthly for Herceptin  and every 3 months for follow-up with me

## 2023-12-25 NOTE — Telephone Encounter (Signed)
 Left vm about scheduled appt date and time.

## 2023-12-25 NOTE — Telephone Encounter (Signed)
 Received call from pt with complaint of lower extremity weakness and difficulty with walking.  Pt states her PCP wrote a prescription for her to have an xray of her back and pelvis area.  Pt requesting if we will order these exams.  RN educated pt that office visit is needed for MD evaluation.  Pt would need to contact PCP and request PCP set up xray appt if they wish to proceed with ordering.  Pt also educated that the [facility] also has xray available and to seek care there for further evaluation.  Pt verbalized understanding.

## 2023-12-26 ENCOUNTER — Ambulatory Visit (HOSPITAL_BASED_OUTPATIENT_CLINIC_OR_DEPARTMENT_OTHER)
Admission: RE | Admit: 2023-12-26 | Discharge: 2023-12-26 | Disposition: A | Discharge status: Home / Self Care | Source: Ambulatory Visit | Attending: Internal Medicine | Admitting: Internal Medicine

## 2023-12-26 DIAGNOSIS — M545 Low back pain, unspecified: Secondary | ICD-10-CM | POA: Diagnosis not present

## 2023-12-26 DIAGNOSIS — M549 Dorsalgia, unspecified: Secondary | ICD-10-CM | POA: Diagnosis not present

## 2023-12-26 DIAGNOSIS — M160 Bilateral primary osteoarthritis of hip: Secondary | ICD-10-CM | POA: Diagnosis not present

## 2023-12-26 DIAGNOSIS — M25552 Pain in left hip: Secondary | ICD-10-CM | POA: Diagnosis not present

## 2023-12-26 DIAGNOSIS — M438X6 Other specified deforming dorsopathies, lumbar region: Secondary | ICD-10-CM | POA: Diagnosis not present

## 2023-12-26 DIAGNOSIS — M47816 Spondylosis without myelopathy or radiculopathy, lumbar region: Secondary | ICD-10-CM | POA: Diagnosis not present

## 2023-12-26 DIAGNOSIS — M4316 Spondylolisthesis, lumbar region: Secondary | ICD-10-CM | POA: Diagnosis not present

## 2023-12-26 NOTE — Telephone Encounter (Signed)
 Multiple attempts made to contact pt No answer LMOM study is needed to proceed with medication

## 2023-12-31 ENCOUNTER — Inpatient Hospital Stay

## 2023-12-31 ENCOUNTER — Other Ambulatory Visit: Payer: Self-pay | Admitting: *Deleted

## 2023-12-31 ENCOUNTER — Inpatient Hospital Stay: Attending: Oncology | Admitting: Hematology and Oncology

## 2023-12-31 VITALS — BP 77/54 | HR 86 | Temp 97.7°F | Resp 20 | Ht 63.0 in | Wt 252.0 lb

## 2023-12-31 VITALS — BP 99/62 | HR 72 | Temp 97.8°F

## 2023-12-31 DIAGNOSIS — Z7901 Long term (current) use of anticoagulants: Secondary | ICD-10-CM | POA: Insufficient documentation

## 2023-12-31 DIAGNOSIS — I871 Compression of vein: Secondary | ICD-10-CM | POA: Insufficient documentation

## 2023-12-31 DIAGNOSIS — C50911 Malignant neoplasm of unspecified site of right female breast: Secondary | ICD-10-CM | POA: Diagnosis not present

## 2023-12-31 DIAGNOSIS — C7800 Secondary malignant neoplasm of unspecified lung: Secondary | ICD-10-CM | POA: Diagnosis not present

## 2023-12-31 DIAGNOSIS — C50812 Malignant neoplasm of overlapping sites of left female breast: Secondary | ICD-10-CM | POA: Diagnosis not present

## 2023-12-31 DIAGNOSIS — C7951 Secondary malignant neoplasm of bone: Secondary | ICD-10-CM | POA: Insufficient documentation

## 2023-12-31 DIAGNOSIS — Z171 Estrogen receptor negative status [ER-]: Secondary | ICD-10-CM | POA: Insufficient documentation

## 2023-12-31 DIAGNOSIS — Z5112 Encounter for antineoplastic immunotherapy: Secondary | ICD-10-CM | POA: Diagnosis not present

## 2023-12-31 DIAGNOSIS — I959 Hypotension, unspecified: Secondary | ICD-10-CM

## 2023-12-31 MED ORDER — ALPRAZOLAM 1 MG PO TABS: 1.0000 mg | ORAL_TABLET | Freq: Three times a day (TID) | ORAL | 3 refills | Status: DC | PRN

## 2023-12-31 MED ORDER — ACETAMINOPHEN 325 MG PO TABS
650.0000 mg | ORAL_TABLET | Freq: Once | ORAL | Status: AC
  Administered 2023-12-31: 650 mg via ORAL
  Filled 2023-12-31: qty 2

## 2023-12-31 MED ORDER — TRASTUZUMAB-DTTB CHEMO 150 MG IV SOLR
750.0000 mg | Freq: Once | INTRAVENOUS | Status: AC
  Administered 2023-12-31: 750 mg via INTRAVENOUS
  Filled 2023-12-31: qty 35.71

## 2023-12-31 MED ORDER — CYCLOBENZAPRINE HCL 5 MG PO TABS: 5.0000 mg | ORAL_TABLET | Freq: Two times a day (BID) | ORAL | 0 refills | Status: AC | PRN

## 2023-12-31 MED ORDER — SODIUM CHLORIDE 0.9 % IV SOLN: Freq: Once | INTRAVENOUS | Status: AC

## 2023-12-31 NOTE — Patient Instructions (Signed)

## 2023-12-31 NOTE — Assessment & Plan Note (Signed)
 2004: Liver. Lung and bone mets Neoadj chemo TCH X 6 completed April 2005 foll by herceptin  maintenance   bilateral mastectomies with bilateral axillary lymph node dissection 12/07/2004, showing             (a) on the right, a mypT1c ypN1 invasive ductal carcinoma, grade 3, estrogen and progesterone receptor negative, HER-2 positive, with an MIB-1 of 31%             (b) on the left, ypT2 ypN1 invasive ductal carcinoma, grade 2, estrogen and progesterone receptor negative, HER-2 positive, with an MIB-1 of 35%.   Adj XRT ----------------------------------------------------------------------------------------------------------------------------------------  Current treatment: Herceptin  maintenance therapy Plan to continue Herceptin  indefinitely every 28 days.   SVC syndrome: On lifelong anticoagulation.  (Because of cost issues we may be switching her from Eliquis  to Pradaxa.  I gave her additional information about Pradaxa and the cost)   Bone scan 09/27/2022: No bone metastases ECHO: 10/31/22: EF 55% (Dr. Mitzie Anda is doing her echocardiograms now every 9 months) CT CAP 05/05/2023: No evidence of recurrent or metastatic disease in chest abdomen pelvis CT CAP 11/04/2023: No evidence of recurrent or metastatic disease.  Healing or healed sternal/anterolateral right fifth and sixth rib fractures.   Cost of treatment: Patient is not able to afford her medications and is having a very difficult time.  I will see if she could qualify for the [employer]. Continue monthly Herceptin  infusions.

## 2023-12-31 NOTE — Progress Notes (Signed)
 Patient Care Team: Olan Bering, MD as PCP - General (Internal Medicine) Gurney Lefort., MD as Consulting Physician (Pain Medicine) Marcene Serve, MD as Referring Physician (Orthopedic Surgery) Hildred Lowenstein, DO as Consulting Physician (Orthopedic Surgery) Darlis Eisenmenger, MD as Consulting Physician (Cardiology) Bensimhon, Rheta Celestine, MD as Consulting Physician (Cardiology) Kandra Orn, MD (Radiology) Ernie Heal, Jerelyn Money, MD as Consulting Physician (Infectious Diseases) Retta Caster, MD as Consulting Physician (Radiation Oncology) Cameron Cea, MD as Consulting Physician (Hematology and Oncology)  DIAGNOSIS:  Encounter Diagnosis  Name Primary?   Carcinoma of right breast metastatic to multiple sites [facility]) Yes    SUMMARY OF ONCOLOGIC HISTORY: Oncology History  Breast cancer metastasized to multiple sites [facility])  09/05/2012 - 09/27/2022 Chemotherapy   Patient is on Treatment Plan : BREAST Trastuzumab  q28d     02/26/2013 Initial Diagnosis   history of inflammatory right breast cancer metastatic at presentation September 2004 with involvement of liver and bone, HER-2 positive, estrogen and progesterone receptor negative      - 11/2013 Chemotherapy   carboplatin, docetaxel and Herceptin  x 6 completed April 2005     Surgery   bilateral mastectomies with bilateral axillary lymph node dissection 12/07/2004, showing             (a) on the right, a mypT1c ypN1 invasive ductal carcinoma, grade 3, estrogen and progesterone receptor negative, HER-2 positive, with an MIB-1 of 31%             (b) on the left, ypT2 ypN1 invasive ductal carcinoma, grade 2, estrogen and progesterone receptor negative, HER-2 positive, with an MIB-1 of 35%.   01/2015 - 02/2015 Radiation Therapy   Adj XRT    - 03/2018 Chemotherapy   Ixampra   10/25/2022 -  Chemotherapy   Patient is on Treatment Plan : BREAST Trastuzumab  IV (8/6) or SQ (600) D1 q21d       CHIEF COMPLIANT: Follow-up on  Herceptin , complains of severe low back pain  HISTORY OF PRESENT ILLNESS:   History of Present Illness [name] is a 73 year old female with degenerative arthritis who presents with back and hip pain.  She is here for monthly Herceptin  infusion.  She is tolerating the infusions fairly well.  Her major complaint is pain in the lower back  She is currently on Eliquis  for anticoagulation with a one-month supply remaining. She has a voucher for Pradaxa, which is a more affordable option. Pain management includes oxycodone , which provides limited relief. Prescriptions for morphine  and muscle relaxers have not been filled. She requires a refill for alprazolam  and a new prescription for cyclobenzaprine .     ALLERGIES:  is allergic to penicillins and adhesive [tape].  MEDICATIONS:  Current Outpatient Medications  Medication Sig Dispense Refill   acetaminophen  (TYLENOL ) 500 MG tablet Take 1,000 mg by mouth every 6 (six) hours as needed for mild pain or fever.      albuterol  (VENTOLIN  HFA) 108 (90 Base) MCG/ACT inhaler Inhale 2 puffs into the lungs every 6 (six) hours as needed for wheezing. 1 each 6   ALPRAZolam  (XANAX ) 1 MG tablet Take 1 tablet (1 mg total) by mouth 3 (three) times daily as needed for anxiety. 60 tablet 3   amLODipine  (NORVASC ) 10 MG tablet Take 1 tablet (10 mg total) by mouth every morning. 30 tablet 4   apixaban  (ELIQUIS ) 5 MG TABS tablet Take 1 tablet (5 mg total) by mouth 2 (two) times daily. 180 tablet 3   baclofen  (  LIORESAL ) 10 MG tablet TAKE 1 TABLET BY MOUTH THREE TIMES DAILY AS NEEDED FOR MUSCLE SPASMS 270 tablet 0   carvedilol  (COREG ) 12.5 MG tablet Take 1.5 tablets (18.75 mg total) by mouth 2 (two) times daily with a meal. 90 tablet 11   diclofenac  sodium (VOLTAREN ) 1 % GEL Apply 2 g topically daily as needed (for pain). Apply to knees and shoulders 100 g 6   DULoxetine (CYMBALTA) 60 MG capsule Take 60 mg by mouth daily.     furosemide  (LASIX ) 40 MG tablet Take 40  mg by mouth as needed.     gabapentin  (NEURONTIN ) 300 MG capsule TAKE 2 CAPSULES BY MOUTH THREE TIMES DAILY 540 capsule 1   losartan  (COZAAR ) 100 MG tablet Take 100 mg by mouth every morning.     Multiple Vitamins-Minerals (ZINC PO) Take by mouth daily.     ondansetron  (ZOFRAN ) 8 MG tablet Take 1 tablet (8 mg total) by mouth every 8 (eight) hours as needed for nausea or vomiting. 20 tablet 1   oxyCODONE -acetaminophen  (PERCOCET) 10-325 MG tablet Take 1 tablet by mouth 4 (four) times daily as needed.     potassium chloride  SA (KLOR-CON ) 20 MEQ tablet Take 1 tablet (20 mEq total) by mouth daily as needed. With use of lasix  30 tablet 3   predniSONE (DELTASONE) 5 MG tablet Take 5 mg by mouth daily in the afternoon.     rosuvastatin  (CRESTOR ) 10 MG tablet Take 1 tablet (10 mg total) by mouth daily. 30 tablet 11   spironolactone  (ALDACTONE ) 25 MG tablet Take 1 tablet daily 90 tablet 3   temazepam  (RESTORIL ) 30 MG capsule Take 1 capsule (30 mg total) by mouth at bedtime. 30 capsule 4   No current facility-administered medications for this visit.   Facility-Administered Medications Ordered in Other Visits  Medication Dose Route Frequency Provider Last Rate Last Admin   sodium chloride  flush (NS) 0.9 % injection 10 mL  10 mL Intravenous PRN Magrinat, Rozella Cornfield, MD   10 mL at 12/15/15 1200   sodium chloride  flush (NS) 0.9 % injection 10 mL  10 mL Intracatheter PRN Magrinat, Rozella Cornfield, MD   10 mL at 08/14/18 1116   sodium chloride  flush (NS) 0.9 % injection 10 mL  10 mL Intracatheter PRN Magrinat, Rozella Cornfield, MD        PHYSICAL EXAMINATION: ECOG PERFORMANCE STATUS: 1 - Symptomatic but completely ambulatory  Vitals:   12/31/23 1110  BP: (!) 77/54  Pulse: 86  Resp: 20  Temp: 97.7 F (36.5 C)  SpO2: 100%   Filed Weights   12/31/23 1110  Weight: 252 lb (114.3 kg)      LABORATORY DATA:  I have reviewed the data as listed    Latest Ref Rng & Units 11/12/2023    2:28 PM 05/02/2023   11:33 AM  01/16/2023    8:24 AM  CMP  Glucose 70 - 99 mg/dL 865   94   BUN 8 - 23 mg/dL 16   17   Creatinine 7.84 - 1.00 mg/dL 6.96  2.95  2.84   Sodium 135 - 145 mmol/L 139   142   Potassium 3.5 - 5.1 mmol/L 3.7   4.7   Chloride 98 - 111 mmol/L 100   106   CO2 22 - 32 mmol/L 30   30   Calcium  8.9 - 10.3 mg/dL 9.1   8.7   Total Protein 6.5 - 8.1 g/dL   6.8   Total Bilirubin 0.3 -  1.2 mg/dL   0.7   Alkaline Phos 38 - 126 U/L   79   AST 15 - 41 U/L   10   ALT 0 - 44 U/L   8     Lab Results  Component Value Date   WBC 8.1 01/16/2023   HGB 10.7 (L) 01/16/2023   HCT 31.4 (L) 01/16/2023   MCV 82.8 01/16/2023   PLT 261 01/16/2023   NEUTROABS 5.3 01/16/2023    ASSESSMENT & PLAN:  Breast cancer metastasized to multiple sites 2004: Liver. Lung and bone mets Neoadj chemo TCH X 6 completed April 2005 foll by herceptin  maintenance   bilateral mastectomies with bilateral axillary lymph node dissection 12/07/2004, showing             (a) on the right, a mypT1c ypN1 invasive ductal carcinoma, grade 3, estrogen and progesterone receptor negative, HER-2 positive, with an MIB-1 of 31%             (b) on the left, ypT2 ypN1 invasive ductal carcinoma, grade 2, estrogen and progesterone receptor negative, HER-2 positive, with an MIB-1 of 35%.   Adj XRT ----------------------------------------------------------------------------------------------------------------------------------------  Current treatment: Herceptin  maintenance therapy Plan to continue Herceptin  indefinitely every 28 days.   SVC syndrome: On lifelong anticoagulation.  (Because of cost issues we may be switching her from Eliquis  to Pradaxa.  I gave her additional information about Pradaxa and the cost)   Bone scan 09/27/2022: No bone metastases ECHO: 10/31/22: EF 55% (Dr. Mitzie Anda is doing her echocardiograms now every 9 months) CT CAP 05/05/2023: No evidence of recurrent or metastatic disease in chest abdomen pelvis CT CAP 11/04/2023: No  evidence of recurrent or metastatic disease.  Healing or healed sternal/anterolateral right fifth and sixth rib fractures.   Cost of treatment: She was able to get her Eliquis  for free but she may run out of it in a month.  I provided her with a voucher for dabigatran 60 capsules which she will fill at [facility] for $60 once she runs out of Eliquis . Continue monthly Herceptin  infusions. ------------------------------------- Assessment and Plan Assessment & Plan Metastatic carcinoma of right breast Cancer stable with recent scans showing no progression.  Degenerative arthritis of the back Significant back pain impacting mobility, current pain management insufficient. - Order MRI of the back to evaluate for disc or nerve issues. - Encourage contacting Dr. Swentec for potential back injections. - Prescribe cyclobenzaprine  for muscle relaxation, once or twice daily as needed. - Refill alprazolam  prescription.  Anticoagulation management Currently on Eliquis  with a month's supply remaining, considering cost-effective alternatives. - Continue Eliquis  until current supply is exhausted. - Consider switching to Pradaxa if cost-effective. - Monitor for any assistance programs for Eliquis .      No orders of the defined types were placed in this encounter.  The patient has a good understanding of the overall plan. she agrees with it. she will call with any problems that may develop before the next visit here. Total time spent: 45 mins including face to face time and time spent for planning, charting and co-ordination of care   Margert Sheerer, MD 12/31/23

## 2024-01-02 NOTE — Progress Notes (Deleted)
 Office Visit Note  Patient: [name]             Date of Birth: 12-Nov-1950           MRN: [id number]             PCP: Olan Bering, MD Referring: Olan Bering, MD Visit Date: 01/08/2024 Occupation: @GUAROCC @  Subjective:  No chief complaint on file.   History of Present Illness: [name] is a 73 y.o. female ***     Activities of Daily Living:  Patient reports morning stiffness for *** {minute/hour:19697}.   Patient {ACTIONS;DENIES/REPORTS:21021675::"Denies"} nocturnal pain.  Difficulty dressing/grooming: {ACTIONS;DENIES/REPORTS:21021675::"Denies"} Difficulty climbing stairs: {ACTIONS;DENIES/REPORTS:21021675::"Denies"} Difficulty getting out of chair: {ACTIONS;DENIES/REPORTS:21021675::"Denies"} Difficulty using hands for taps, buttons, cutlery, and/or writing: {ACTIONS;DENIES/REPORTS:21021675::"Denies"}  No Rheumatology ROS completed.   PMFS History:  Patient Active Problem List   Diagnosis Date Noted   Goals of care, counseling/discussion 08/13/2019   Port-A-Cath in place 08/14/2018   Port-A-Cath in place 01/26/2016   Obesity, morbid, BMI 50 or higher (HCC) 09/22/2015   Malignant neoplasm of both breasts in female, estrogen receptor negative (HCC) 09/22/2015   Coagulopathy (HCC) 09/22/2015   Peripheral neuropathy 09/22/2015   Hot flashes 03/24/2015   Cellulitis of chest wall 06/16/2014   Right shoulder pain 06/12/2013   Neck pain 06/12/2013   Gram-negative bacteremia (HCC) 03/21/2013   Anemia of chronic disease 03/21/2013   Breast cancer metastasized to multiple sites (HCC) 02/26/2013   CHF (congestive heart failure) (HCC) 09/11/2012   SVC syndrome 09/05/2012   Neuropathy 09/05/2012   Hypertension 09/05/2012   Anxiety 09/05/2012    Past Medical History:  Diagnosis Date   Anemia    pt pt report   Anxiety    Breast cancer (HCC)    mets to liver and lung   Breast cancer metastasized to multiple sites (HCC) 02/26/2013   Cellulitis     CHF (congestive heart failure) (HCC)    History of chemotherapy 09/2004   taxotere/herceptin /carboplatin   Hypertension    Neuropathy    Radiation 07/31/2006   left upper chest   Radiation 06/17/2006-06/27/2006   6480 cGy bilat. chest wall   SVC syndrome    Thrombosis     Family History  Problem Relation Age of Onset   Heart failure Father    Cancer Father        Prostate cancer   Heart failure Brother    Cancer Brother        Prostate cancer   Diabetes Maternal Aunt    Past Surgical History:  Procedure Laterality Date   ANKLE SURGERY Left    BACK SURGERY     CHOLECYSTECTOMY  08/28/1987   MASS EXCISION Left 05/10/2022   Procedure: EXCISION OF LEFT CHEST WALL MASS;  Surgeon: Sim Dryer, MD;  Location: [facility] OR;  Service: General;  Laterality: Left;   MASTECTOMY Bilateral    w/ lymph node removal per patient   PERIPHERALLY INSERTED CENTRAL CATHETER INSERTION     PICC LINE INSERTION     PICC LINE REMOVAL ([facility] HX)     SURGICAL EXCISION OF EXCESSIVE SKIN     TUBAL LIGATION  08/27/1984   Social History   Social History Narrative   Not on file   Immunization History  Administered Date(s) Administered   Fluad Quad(high Dose 65+) 05/21/2019, 05/18/2020, 06/22/2021   Influenza, High Dose Seasonal PF 06/19/2017   Influenza,inj,Quad PF,6+ Mos 06/20/2016, 07/16/2018   Moderna Covid-19 Vaccine  Bivalent Booster 45yrs & up 10/22/2019, 11/23/2019,  04/29/2020   Moderna SARS-COV2 Booster Vaccination 02/04/2021   Pneumococcal Conjugate-13 02/06/2017     Objective: Vital Signs: There were no vitals taken for this visit.   Physical Exam   Musculoskeletal Exam: ***  CDAI Exam: CDAI Score: -- Patient Global: --; Provider Global: -- Swollen: --; Tender: -- Joint Exam 01/08/2024   No joint exam has been documented for this visit   There is currently no information documented on the homunculus. Go to the Rheumatology activity and complete the homunculus joint  exam.  Investigation: No additional findings.  Imaging: DG HIP UNILAT WITH PELVIS 2-3 VIEWS LEFT Result Date: 12/26/2023 CLINICAL DATA:  Low back pain and left hip pain. EXAM: DG HIP (WITH OR WITHOUT PELVIS) 2-3V LEFT COMPARISON:  None Available. FINDINGS: Pelvis is intact with normal and symmetric sacroiliac joints. No acute fracture or dislocation. No aggressive osseous lesion. Visualized sacral arcuate lines are unremarkable. There are changes of chronic pubic symphisitis. There are moderate degenerative changes of bilateral hip joints characterized by joint space narrowing and osteophytosis of the superior acetabulum. No radiopaque foreign bodies. There is dystrophic calcification overlying the right side of the pelvis, which corresponds to calcified leiomyomas on the prior CT scan abdomen and pelvis. IMPRESSION: *No acute osseous abnormality of the pelvis or left hip joint. Electronically Signed   By: Beula Brunswick M.D.   On: 12/26/2023 12:02   DG Lumbar Spine Complete Result Date: 12/26/2023 CLINICAL DATA:  Low back pain. EXAM: LUMBAR SPINE - COMPLETE 4+ VIEW COMPARISON:  None Available. FINDINGS: There are 5 nonrib-bearing lumbar vertebrae. Anatomic lumbar curvature. There is grade 1/2 anterolisthesis of L4 over L5. Vertebral body heights are maintained. No aggressive osseous lesion. Moderate multilevel degenerative changes in the form of reduced intervertebral disc height, facet arthropathy and marginal osteophyte formation. Sacroiliac joints are symmetric. Visualized soft tissues are within normal limits. IMPRESSION: *No acute osseous abnormality of the lumbar spine. Moderate multilevel degenerative changes. Electronically Signed   By: Beula Brunswick M.D.   On: 12/26/2023 12:01    Recent Labs: Lab Results  Component Value Date   WBC 8.1 01/16/2023   HGB 10.7 (L) 01/16/2023   PLT 261 01/16/2023   NA 139 11/12/2023   K 3.7 11/12/2023   CL 100 11/12/2023   CO2 30 11/12/2023   GLUCOSE 105  (H) 11/12/2023   BUN 16 11/12/2023   CREATININE 0.82 11/12/2023   BILITOT 0.7 01/16/2023   ALKPHOS 79 01/16/2023   AST 10 (L) 01/16/2023   ALT 8 01/16/2023   PROT 6.8 01/16/2023   ALBUMIN 3.6 01/16/2023   CALCIUM  9.1 11/12/2023   GFRAA >60 05/18/2020    Speciality Comments: No specialty comments available.  Procedures:  No procedures performed Allergies: Penicillins and Adhesive [tape]   Assessment / Plan:     Visit Diagnoses: No diagnosis found.  Orders: No orders of the defined types were placed in this encounter.  No orders of the defined types were placed in this encounter.   Face-to-face time spent with patient was *** minutes. Greater than 50% of time was spent in counseling and coordination of care.  Follow-Up Instructions: No follow-ups on file.   Dee Farber, CMA  Note - This record has been created using Animal nutritionist.  Chart creation errors have been sought, but may not always  have been located. Such creation errors do not reflect on  the standard of medical care.

## 2024-01-08 ENCOUNTER — Ambulatory Visit: Payer: Medicare Other | Admitting: Rheumatology

## 2024-01-08 DIAGNOSIS — I5032 Chronic diastolic (congestive) heart failure: Secondary | ICD-10-CM

## 2024-01-08 DIAGNOSIS — R7881 Bacteremia: Secondary | ICD-10-CM

## 2024-01-08 DIAGNOSIS — G8929 Other chronic pain: Secondary | ICD-10-CM

## 2024-01-08 DIAGNOSIS — M170 Bilateral primary osteoarthritis of knee: Secondary | ICD-10-CM

## 2024-01-08 DIAGNOSIS — M5030 Other cervical disc degeneration, unspecified cervical region: Secondary | ICD-10-CM

## 2024-01-08 DIAGNOSIS — Z95828 Presence of other vascular implants and grafts: Secondary | ICD-10-CM

## 2024-01-08 DIAGNOSIS — G629 Polyneuropathy, unspecified: Secondary | ICD-10-CM

## 2024-01-08 DIAGNOSIS — D689 Coagulation defect, unspecified: Secondary | ICD-10-CM

## 2024-01-08 DIAGNOSIS — M19041 Primary osteoarthritis, right hand: Secondary | ICD-10-CM

## 2024-01-08 DIAGNOSIS — I871 Compression of vein: Secondary | ICD-10-CM

## 2024-01-08 DIAGNOSIS — Z8659 Personal history of other mental and behavioral disorders: Secondary | ICD-10-CM

## 2024-01-08 DIAGNOSIS — R768 Other specified abnormal immunological findings in serum: Secondary | ICD-10-CM

## 2024-01-08 DIAGNOSIS — C50911 Malignant neoplasm of unspecified site of right female breast: Secondary | ICD-10-CM

## 2024-01-08 DIAGNOSIS — D638 Anemia in other chronic diseases classified elsewhere: Secondary | ICD-10-CM

## 2024-01-08 DIAGNOSIS — I152 Hypertension secondary to endocrine disorders: Secondary | ICD-10-CM

## 2024-01-09 ENCOUNTER — Telehealth: Payer: Self-pay | Admitting: Hematology and Oncology

## 2024-01-09 ENCOUNTER — Encounter: Payer: Self-pay | Admitting: Hematology and Oncology

## 2024-01-09 ENCOUNTER — Other Ambulatory Visit: Payer: Self-pay | Admitting: Hematology and Oncology

## 2024-01-09 NOTE — Telephone Encounter (Signed)
 Left vm about scheduled appt dates and times.

## 2024-01-10 ENCOUNTER — Other Ambulatory Visit: Payer: Self-pay

## 2024-01-14 DIAGNOSIS — M4316 Spondylolisthesis, lumbar region: Secondary | ICD-10-CM | POA: Diagnosis not present

## 2024-01-14 DIAGNOSIS — M545 Low back pain, unspecified: Secondary | ICD-10-CM | POA: Diagnosis not present

## 2024-01-15 ENCOUNTER — Ambulatory Visit: Payer: Medicare Other | Admitting: Hematology and Oncology

## 2024-01-15 ENCOUNTER — Ambulatory Visit: Payer: Medicare Other

## 2024-01-15 ENCOUNTER — Other Ambulatory Visit: Payer: Self-pay

## 2024-01-17 ENCOUNTER — Other Ambulatory Visit: Payer: Self-pay

## 2024-01-17 ENCOUNTER — Encounter (HOSPITAL_COMMUNITY): Payer: Self-pay

## 2024-01-17 ENCOUNTER — Emergency Department (HOSPITAL_COMMUNITY)
Admission: EM | Admit: 2024-01-17 | Discharge: 2024-01-17 | Disposition: A | Discharge status: Home / Self Care | Source: Home / Self Care | Attending: Emergency Medicine | Admitting: Emergency Medicine

## 2024-01-17 ENCOUNTER — Emergency Department (HOSPITAL_COMMUNITY)

## 2024-01-17 DIAGNOSIS — C50919 Malignant neoplasm of unspecified site of unspecified female breast: Secondary | ICD-10-CM | POA: Diagnosis present

## 2024-01-17 DIAGNOSIS — Z91048 Other nonmedicinal substance allergy status: Secondary | ICD-10-CM | POA: Diagnosis not present

## 2024-01-17 DIAGNOSIS — Z7901 Long term (current) use of anticoagulants: Secondary | ICD-10-CM | POA: Insufficient documentation

## 2024-01-17 DIAGNOSIS — M25552 Pain in left hip: Secondary | ICD-10-CM

## 2024-01-17 DIAGNOSIS — E8721 Acute metabolic acidosis: Secondary | ICD-10-CM | POA: Diagnosis present

## 2024-01-17 DIAGNOSIS — R600 Localized edema: Secondary | ICD-10-CM | POA: Diagnosis not present

## 2024-01-17 DIAGNOSIS — C7951 Secondary malignant neoplasm of bone: Secondary | ICD-10-CM | POA: Insufficient documentation

## 2024-01-17 DIAGNOSIS — Z9013 Acquired absence of bilateral breasts and nipples: Secondary | ICD-10-CM | POA: Diagnosis not present

## 2024-01-17 DIAGNOSIS — Z86711 Personal history of pulmonary embolism: Secondary | ICD-10-CM | POA: Diagnosis not present

## 2024-01-17 DIAGNOSIS — I10 Essential (primary) hypertension: Secondary | ICD-10-CM | POA: Diagnosis not present

## 2024-01-17 DIAGNOSIS — Z452 Encounter for adjustment and management of vascular access device: Secondary | ICD-10-CM | POA: Diagnosis not present

## 2024-01-17 DIAGNOSIS — F419 Anxiety disorder, unspecified: Secondary | ICD-10-CM | POA: Diagnosis present

## 2024-01-17 DIAGNOSIS — Z880 Allergy status to penicillin: Secondary | ICD-10-CM | POA: Diagnosis not present

## 2024-01-17 DIAGNOSIS — C787 Secondary malignant neoplasm of liver and intrahepatic bile duct: Secondary | ICD-10-CM | POA: Diagnosis not present

## 2024-01-17 DIAGNOSIS — R000 Tachycardia, unspecified: Secondary | ICD-10-CM | POA: Diagnosis not present

## 2024-01-17 DIAGNOSIS — C7800 Secondary malignant neoplasm of unspecified lung: Secondary | ICD-10-CM | POA: Diagnosis not present

## 2024-01-17 DIAGNOSIS — K920 Hematemesis: Secondary | ICD-10-CM | POA: Diagnosis present

## 2024-01-17 DIAGNOSIS — I509 Heart failure, unspecified: Secondary | ICD-10-CM | POA: Insufficient documentation

## 2024-01-17 DIAGNOSIS — I110 Hypertensive heart disease with heart failure: Secondary | ICD-10-CM | POA: Diagnosis not present

## 2024-01-17 DIAGNOSIS — Z6841 Body Mass Index (BMI) 40.0 and over, adult: Secondary | ICD-10-CM | POA: Diagnosis not present

## 2024-01-17 DIAGNOSIS — Z853 Personal history of malignant neoplasm of breast: Secondary | ICD-10-CM | POA: Insufficient documentation

## 2024-01-17 DIAGNOSIS — Z79899 Other long term (current) drug therapy: Secondary | ICD-10-CM | POA: Diagnosis not present

## 2024-01-17 DIAGNOSIS — Z833 Family history of diabetes mellitus: Secondary | ICD-10-CM | POA: Diagnosis not present

## 2024-01-17 DIAGNOSIS — Z8249 Family history of ischemic heart disease and other diseases of the circulatory system: Secondary | ICD-10-CM | POA: Diagnosis not present

## 2024-01-17 DIAGNOSIS — R112 Nausea with vomiting, unspecified: Secondary | ICD-10-CM | POA: Diagnosis not present

## 2024-01-17 DIAGNOSIS — E876 Hypokalemia: Secondary | ICD-10-CM | POA: Diagnosis present

## 2024-01-17 DIAGNOSIS — K2970 Gastritis, unspecified, without bleeding: Secondary | ICD-10-CM | POA: Diagnosis not present

## 2024-01-17 DIAGNOSIS — I5032 Chronic diastolic (congestive) heart failure: Secondary | ICD-10-CM | POA: Diagnosis not present

## 2024-01-17 DIAGNOSIS — Z66 Do not resuscitate: Secondary | ICD-10-CM | POA: Diagnosis present

## 2024-01-17 DIAGNOSIS — K3189 Other diseases of stomach and duodenum: Secondary | ICD-10-CM | POA: Diagnosis present

## 2024-01-17 DIAGNOSIS — Z91410 Personal history of adult physical and sexual abuse: Secondary | ICD-10-CM | POA: Diagnosis not present

## 2024-01-17 DIAGNOSIS — E66813 Obesity, class 3: Secondary | ICD-10-CM | POA: Diagnosis present

## 2024-01-17 MED ORDER — OXYCODONE-ACETAMINOPHEN 5-325 MG PO TABS: 1.0000 | ORAL_TABLET | Freq: Four times a day (QID) | ORAL | 0 refills | Status: DC | PRN

## 2024-01-17 MED ORDER — LIDOCAINE 5 % EX PTCH
1.0000 | MEDICATED_PATCH | CUTANEOUS | Status: DC
  Administered 2024-01-17: 1 via TRANSDERMAL
  Filled 2024-01-17: qty 1

## 2024-01-17 MED ORDER — LIDOCAINE 5 % EX PTCH: 1.0000 | MEDICATED_PATCH | CUTANEOUS | 0 refills | Status: DC

## 2024-01-17 MED ORDER — ACETAMINOPHEN 500 MG PO TABS
1000.0000 mg | ORAL_TABLET | Freq: Once | ORAL | Status: AC
Start: 2024-01-17 — End: 2024-01-17
  Administered 2024-01-17: 1000 mg via ORAL
  Filled 2024-01-17: qty 2

## 2024-01-17 MED ORDER — OXYCODONE HCL 5 MG PO TABS
5.0000 mg | ORAL_TABLET | Freq: Once | ORAL | Status: AC
  Administered 2024-01-17: 5 mg via ORAL
  Filled 2024-01-17: qty 1

## 2024-01-17 NOTE — ED Provider Notes (Signed)
 [place] [facility] DEPARTMENT AT [facility] Provider Note   CSN: [id number] Arrival date & time: 01/17/24  1857     History  Chief Complaint  Patient presents with   Hip Pain    [name] is a 73 y.o. female.  HPI Past medical history significant for metastatic breast cancer in remission with monthly Herceptin  treatment, PE on chronic anticoagulation, CHF.  Patient presents with left-sided hip and leg pain. Pain has been long standing. Patient does have chronic pain management with oxycodone  but more recent pain exacerbation relieved much.  This has been persistent now for 3 weeks with worsening symptoms now with pain limiting walking. Weight bearing and transfers difficult with pain radiating from hip into upper leg. No fevers, chills, no redness, no associated abdominal pain. No chest pain or shortness of breath.    Home Medications Prior to Admission medications   Medication Sig Start Date End Date Taking? Authorizing Provider  acetaminophen  (TYLENOL ) 500 MG tablet Take 1,000 mg by mouth every 6 (six) hours as needed for mild pain or fever.     [provider]  albuterol  (VENTOLIN  HFA) 108 (90 Base) MCG/ACT inhaler Inhale 2 puffs into the lungs every 6 (six) hours as needed for wheezing. 06/28/22   Gudena, Vinay, MD  ALPRAZolam  (XANAX ) 1 MG tablet Take 1 tablet (1 mg total) by mouth 3 (three) times daily as needed for anxiety. 12/31/23   Gudena, Vinay, MD  amLODipine  (NORVASC ) 10 MG tablet Take 1 tablet (10 mg total) by mouth every morning. 09/10/19   Magrinat, Rozella Cornfield, MD  apixaban  (ELIQUIS ) 5 MG TABS tablet Take 1 tablet (5 mg total) by mouth 2 (two) times daily. 08/06/23   Gudena, Vinay, MD  baclofen  (LIORESAL ) 10 MG tablet TAKE 1 TABLET BY MOUTH THREE TIMES DAILY AS NEEDED FOR MUSCLE SPASMS 05/19/21   Magrinat, Rozella Cornfield, MD  carvedilol  (COREG ) 12.5 MG tablet Take 1.5 tablets (18.75 mg total) by mouth 2 (two) times daily with a meal. 11/12/23   Darlis Eisenmenger, MD  cyclobenzaprine  (FLEXERIL ) 5 MG tablet Take 1 tablet (5 mg total) by mouth 2 (two) times daily as needed for muscle spasms. 12/31/23   Gudena, Vinay, MD  diclofenac  sodium (VOLTAREN ) 1 % GEL Apply 2 g topically daily as needed (for pain). Apply to knees and shoulders 08/03/13   Magrinat, Rozella Cornfield, MD  DULoxetine (CYMBALTA) 60 MG capsule Take 60 mg by mouth daily. 12/17/22   [provider]  furosemide  (LASIX ) 40 MG tablet Take 40 mg by mouth as needed.    [provider]  gabapentin  (NEURONTIN ) 300 MG capsule TAKE 2 CAPSULES BY MOUTH THREE TIMES DAILY 04/15/23   Cameron Cea, MD  losartan  (COZAAR ) 100 MG tablet Take 100 mg by mouth every morning.    [provider]  Multiple Vitamins-Minerals (ZINC PO) Take by mouth daily.    [provider]  ondansetron  (ZOFRAN ) 8 MG tablet Take 1 tablet (8 mg total) by mouth every 8 (eight) hours as needed for nausea or vomiting. 08/30/22   Gudena, Vinay, MD  oxyCODONE -acetaminophen  (PERCOCET) 10-325 MG tablet Take 1 tablet by mouth 4 (four) times daily as needed. 08/17/19   [provider]  potassium chloride  SA (KLOR-CON ) 20 MEQ tablet Take 1 tablet (20 mEq total) by mouth daily as needed. With use of lasix  05/10/21   Darlis Eisenmenger, MD  predniSONE (DELTASONE) 5 MG tablet Take 5 mg by mouth daily in the afternoon.  [provider]  rosuvastatin  (CRESTOR ) 10 MG tablet Take 1 tablet (10 mg total) by mouth daily. 11/14/23 11/13/24  Darlis Eisenmenger, MD  spironolactone  (ALDACTONE ) 25 MG tablet Take 1 tablet daily 10/31/22   McLean, Dalton S, MD  temazepam  (RESTORIL ) 30 MG capsule Take 1 capsule (30 mg total) by mouth at bedtime. 08/30/23   Gudena, Vinay, MD      Allergies    Penicillins and Adhesive [tape]    Review of Systems   Review of Systems  Physical Exam Updated Vital Signs BP 127/83 (BP Location: Left Arm)   Pulse (!) 102   Temp 97.9 F (36.6 C) (Oral)   Resp 18   Ht 5\' 3"  (1.6 m)   Wt  111.6 kg   SpO2 92%   BMI 43.58 kg/m  Physical Exam Constitutional:      Comments: Alert, nontoxic, no respiratory distress.  HENT:     Head: Normocephalic and atraumatic.     Mouth/Throat:     Pharynx: Oropharynx is clear.  Eyes:     Extraocular Movements: Extraocular movements intact.  Cardiovascular:     Rate and Rhythm: Normal rate and regular rhythm.  Pulmonary:     Effort: Pulmonary effort is normal.     Breath sounds: Normal breath sounds.  Abdominal:     General: There is no distension.     Palpations: Abdomen is soft.     Tenderness: There is no abdominal tenderness.  Musculoskeletal:     Comments: Examination low back and hip show no soft tissue swelling, erythema or rash. No deformity, passive range of motion intact without significant pain. No groin mass or lymphadenopathy. No knee effusion. Calf non tender and pliable. Feet warm, no active wounds or appearance of cellulitis.  Skin:    General: Skin is warm and dry.  Neurological:     General: No focal deficit present.     Mental Status: She is oriented to person, place, and time.     ED Results / Procedures / Treatments   Labs (all labs ordered are listed, but only abnormal results are displayed) Labs Reviewed - No data to display  EKG None  Radiology No results found.  Procedures Procedures    Medications Ordered in ED Medications - No data to display  ED Course/ Medical Decision Making/ A&P                                 Medical Decision Making Amount and/or Complexity of Data Reviewed Radiology: ordered.  Risk OTC drugs. Prescription drug management.   Patient presents with left hip pain with with indolent, gradual worsening of fairly longstanding pain, no acute injury but increasing pain causing ambulatory dysfunction. Patient has prior history of metastatic breast cancer getting monthly Herceptin  infusion.  She has been experiencing increasing problems with back and hip pain.  Patient  is scheduled for MRI by Dr. Lee Public the upcoming week for these symptoms. Differential diagnosis includes occult hip stress fracture, metastatic bone tumor, radiculopathy, DJD. PE does not suggest soft tissue infection. Lower suspicion for DVT, patient is chronically anticoagulated and pain is much more suggestive of musculoskeletal etiology. Will obtain CT imaging which is immediately available for possible occult fracture or metastatic disease with some additional soft tissue diagnostic utility. With no fall or deformity and plain film X ray done 12/26/23 for same symptoms, no utility in repeating plain film Xray.   CT  imaging reviewed by radiology: Loss of cortical definition and erosions about the left SI joint. Associated thickening/edema of the left iliacus muscle. These findings are new compared to 11/14/2023. Primary differential considerations are septic sacroiliitis with myositis of the iliacus muscle or infiltrative mass/metastasis in the left SI joint and hematoma in the left iliacus.  CT concerning for metastatic disease in the setting of previous metastatic breast cancer. Process has been gradual with incrementally increasing pain difficult to control with patients's baseline chronic pain management. Lower probability to be infectious with very indolent course and no systemic symptoms. Radiology interpretation suggests hematoma in left iliacus which is feasible in the setting of anticoagulation but as nontraumatic and no defined fluid collection on CT, low risk of serious bleeding related issues.   I discussed CT findings extensively with patient and family. We discussed the fact that at this time, in the setting of patient's known history, HPI and PE, I had low suspicion for infection as the cause, however very low threshold for return if any systemic or neurologic symptoms developed. We also discussed the fact that MRI was still indicated and her diagnostic evaluation still needed to be  continued with outpatient providers. Given that the patient did not have neurologic deficit and the etiology of pain was in the SI joint and muscle, I did not feel strongly that the patient needed transfer for emergent MRI and she has good established follow up already assessing the current problem.        Final Clinical Impression(s) / ED Diagnoses Final diagnoses:  Left hip pain  Metastasis to bone [facility])    Rx / DC Orders ED Discharge Orders     None         Wynetta Heckle, MD 01/30/24 1130

## 2024-01-17 NOTE — Discharge Instructions (Addendum)
 1.  You had a CT scan done in the emergency department for your left hip pain.  This CT scan shows an abnormality that is very suspicious for a metastatic bone tumor.  There is some inflammation of the surrounding muscle.  You are scheduled for an MRI for this same pain coming up next week.  You still need to get your MRI and do close follow-up with your oncologist. 2.  The radiologist interpretation suggested that the abnormal finding might be a metastatic cancer or tumor or possibly a focus of infection.  Given your history and the slow onset of symptoms without other symptoms of fever or signs of infection, I have very high suspicion for a metastatic tumor.  However, if it anytime you develop a fever, chills or worsening general illness, return to the emergency department immediately for recheck. 3.  In addition to the Percocet tablets you may try applying some Lidoderm  patches over the areas of pain.

## 2024-01-17 NOTE — ED Triage Notes (Signed)
 Pt is coming in for left sided hip and leg pain that is a chronic issue that is getting more acute. She is a cancer patient with osteoarthritis in her lower back which is the concern for the pain she is experiencing now. The daughter reports she has had lots of difficulty moving and walking. She is otherwise stable at this time with no other complaints.

## 2024-01-19 ENCOUNTER — Inpatient Hospital Stay (HOSPITAL_COMMUNITY)
Admission: EM | Admit: 2024-01-19 | Discharge: 2024-01-21 | DRG: 378 | Disposition: A | Discharge status: Home Health | Attending: Internal Medicine | Admitting: Internal Medicine

## 2024-01-19 ENCOUNTER — Emergency Department (HOSPITAL_COMMUNITY)

## 2024-01-19 ENCOUNTER — Other Ambulatory Visit: Payer: Self-pay

## 2024-01-19 ENCOUNTER — Encounter (HOSPITAL_COMMUNITY): Payer: Self-pay | Admitting: Emergency Medicine

## 2024-01-19 DIAGNOSIS — Z171 Estrogen receptor negative status [ER-]: Secondary | ICD-10-CM

## 2024-01-19 DIAGNOSIS — K3189 Other diseases of stomach and duodenum: Secondary | ICD-10-CM | POA: Diagnosis present

## 2024-01-19 DIAGNOSIS — Z79899 Other long term (current) drug therapy: Secondary | ICD-10-CM

## 2024-01-19 DIAGNOSIS — Z7901 Long term (current) use of anticoagulants: Secondary | ICD-10-CM

## 2024-01-19 DIAGNOSIS — K219 Gastro-esophageal reflux disease without esophagitis: Secondary | ICD-10-CM | POA: Diagnosis present

## 2024-01-19 DIAGNOSIS — E876 Hypokalemia: Secondary | ICD-10-CM | POA: Diagnosis not present

## 2024-01-19 DIAGNOSIS — Z452 Encounter for adjustment and management of vascular access device: Secondary | ICD-10-CM | POA: Diagnosis not present

## 2024-01-19 DIAGNOSIS — C787 Secondary malignant neoplasm of liver and intrahepatic bile duct: Secondary | ICD-10-CM | POA: Diagnosis present

## 2024-01-19 DIAGNOSIS — I110 Hypertensive heart disease with heart failure: Secondary | ICD-10-CM | POA: Diagnosis present

## 2024-01-19 DIAGNOSIS — Z8249 Family history of ischemic heart disease and other diseases of the circulatory system: Secondary | ICD-10-CM

## 2024-01-19 DIAGNOSIS — I10 Essential (primary) hypertension: Secondary | ICD-10-CM | POA: Diagnosis present

## 2024-01-19 DIAGNOSIS — Z66 Do not resuscitate: Secondary | ICD-10-CM | POA: Diagnosis not present

## 2024-01-19 DIAGNOSIS — Z6841 Body Mass Index (BMI) 40.0 and over, adult: Secondary | ICD-10-CM

## 2024-01-19 DIAGNOSIS — Z9049 Acquired absence of other specified parts of digestive tract: Secondary | ICD-10-CM

## 2024-01-19 DIAGNOSIS — Z91048 Other nonmedicinal substance allergy status: Secondary | ICD-10-CM

## 2024-01-19 DIAGNOSIS — E8721 Acute metabolic acidosis: Secondary | ICD-10-CM | POA: Diagnosis not present

## 2024-01-19 DIAGNOSIS — K920 Hematemesis: Principal | ICD-10-CM | POA: Diagnosis present

## 2024-01-19 DIAGNOSIS — Z86711 Personal history of pulmonary embolism: Secondary | ICD-10-CM | POA: Diagnosis not present

## 2024-01-19 DIAGNOSIS — C50919 Malignant neoplasm of unspecified site of unspecified female breast: Secondary | ICD-10-CM | POA: Diagnosis present

## 2024-01-19 DIAGNOSIS — E66813 Obesity, class 3: Secondary | ICD-10-CM | POA: Diagnosis present

## 2024-01-19 DIAGNOSIS — Z880 Allergy status to penicillin: Secondary | ICD-10-CM

## 2024-01-19 DIAGNOSIS — I5032 Chronic diastolic (congestive) heart failure: Secondary | ICD-10-CM | POA: Diagnosis present

## 2024-01-19 DIAGNOSIS — Z9013 Acquired absence of bilateral breasts and nipples: Secondary | ICD-10-CM

## 2024-01-19 DIAGNOSIS — R000 Tachycardia, unspecified: Secondary | ICD-10-CM | POA: Diagnosis not present

## 2024-01-19 DIAGNOSIS — C7951 Secondary malignant neoplasm of bone: Secondary | ICD-10-CM | POA: Diagnosis present

## 2024-01-19 DIAGNOSIS — C7800 Secondary malignant neoplasm of unspecified lung: Secondary | ICD-10-CM | POA: Diagnosis present

## 2024-01-19 DIAGNOSIS — Z833 Family history of diabetes mellitus: Secondary | ICD-10-CM

## 2024-01-19 DIAGNOSIS — Z91410 Personal history of adult physical and sexual abuse: Secondary | ICD-10-CM

## 2024-01-19 DIAGNOSIS — Z5112 Encounter for antineoplastic immunotherapy: Secondary | ICD-10-CM

## 2024-01-19 DIAGNOSIS — R627 Adult failure to thrive: Secondary | ICD-10-CM | POA: Diagnosis present

## 2024-01-19 DIAGNOSIS — F419 Anxiety disorder, unspecified: Secondary | ICD-10-CM | POA: Diagnosis present

## 2024-01-19 LAB — CBC WITH DIFFERENTIAL/PLATELET
Abs Immature Granulocytes: 0.05 10*3/uL (ref 0.00–0.07)
Basophils Absolute: 0 10*3/uL (ref 0.0–0.1)
Basophils Relative: 0 %
Eosinophils Absolute: 0.1 10*3/uL (ref 0.0–0.5)
Eosinophils Relative: 1 %
HCT: 31.1 % — ABNORMAL LOW (ref 36.0–46.0)
Hemoglobin: 10.8 g/dL — ABNORMAL LOW (ref 12.0–15.0)
Immature Granulocytes: 0 %
Lymphocytes Relative: 21 %
Lymphs Abs: 2.4 10*3/uL (ref 0.7–4.0)
MCH: 27.8 pg (ref 26.0–34.0)
MCHC: 34.7 g/dL (ref 30.0–36.0)
MCV: 80.2 fL (ref 80.0–100.0)
Monocytes Absolute: 1 10*3/uL (ref 0.1–1.0)
Monocytes Relative: 9 %
Neutro Abs: 8.2 10*3/uL — ABNORMAL HIGH (ref 1.7–7.7)
Neutrophils Relative %: 69 %
Platelets: 355 10*3/uL (ref 150–400)
RBC: 3.88 MIL/uL (ref 3.87–5.11)
RDW: 14.2 % (ref 11.5–15.5)
WBC: 11.9 10*3/uL — ABNORMAL HIGH (ref 4.0–10.5)
nRBC: 0 % (ref 0.0–0.2)

## 2024-01-19 LAB — COMPREHENSIVE METABOLIC PANEL WITH GFR
ALT: 8 U/L (ref 0–44)
AST: 15 U/L (ref 15–41)
Albumin: 2.7 g/dL — ABNORMAL LOW (ref 3.5–5.0)
Alkaline Phosphatase: 75 U/L (ref 38–126)
Anion gap: 20 — ABNORMAL HIGH (ref 5–15)
BUN: 12 mg/dL (ref 8–23)
CO2: 18 mmol/L — ABNORMAL LOW (ref 22–32)
Calcium: 8.8 mg/dL — ABNORMAL LOW (ref 8.9–10.3)
Chloride: 101 mmol/L (ref 98–111)
Creatinine, Ser: 1.14 mg/dL — ABNORMAL HIGH (ref 0.44–1.00)
GFR, Estimated: 51 mL/min — ABNORMAL LOW (ref >60)
Glucose, Bld: 107 mg/dL — ABNORMAL HIGH (ref 70–99)
Potassium: 3.2 mmol/L — ABNORMAL LOW (ref 3.5–5.1)
Sodium: 139 mmol/L (ref 135–145)
Total Bilirubin: 1.2 mg/dL (ref 0.0–1.2)
Total Protein: 8.4 g/dL — ABNORMAL HIGH (ref 6.5–8.1)

## 2024-01-19 LAB — TYPE AND SCREEN
ABO/RH(D): A POS
Antibody Screen: NEGATIVE

## 2024-01-19 LAB — ABO/RH: ABO/RH(D): A POS

## 2024-01-19 LAB — PROTIME-INR
INR: 1.6 — ABNORMAL HIGH (ref 0.8–1.2)
Prothrombin Time: 19.6 s — ABNORMAL HIGH (ref 11.4–15.2)

## 2024-01-19 MED ORDER — PROCHLORPERAZINE EDISYLATE 10 MG/2ML IJ SOLN
5.0000 mg | Freq: Once | INTRAMUSCULAR | Status: AC | PRN
  Administered 2024-01-19: 5 mg via INTRAVENOUS
  Filled 2024-01-19: qty 2

## 2024-01-19 MED ORDER — LACTATED RINGERS IV SOLN: INTRAVENOUS | Status: AC

## 2024-01-19 MED ORDER — PANTOPRAZOLE SODIUM 40 MG IV SOLR
40.0000 mg | Freq: Two times a day (BID) | INTRAVENOUS | Status: DC
  Administered 2024-01-19 – 2024-01-21 (×4): 40 mg via INTRAVENOUS
  Filled 2024-01-19 (×4): qty 10

## 2024-01-19 MED ORDER — ALPRAZOLAM 0.5 MG PO TABS
1.0000 mg | ORAL_TABLET | Freq: Three times a day (TID) | ORAL | Status: DC | PRN
  Administered 2024-01-20: 1 mg via ORAL
  Filled 2024-01-19: qty 2

## 2024-01-19 MED ORDER — OXYCODONE-ACETAMINOPHEN 5-325 MG PO TABS
1.0000 | ORAL_TABLET | Freq: Four times a day (QID) | ORAL | Status: DC | PRN
  Administered 2024-01-20: 2 via ORAL
  Filled 2024-01-19 (×2): qty 2

## 2024-01-19 MED ORDER — TEMAZEPAM 15 MG PO CAPS
30.0000 mg | ORAL_CAPSULE | Freq: Every day | ORAL | Status: DC
  Administered 2024-01-19 – 2024-01-20 (×2): 30 mg via ORAL
  Filled 2024-01-19 (×2): qty 2

## 2024-01-19 MED ORDER — SODIUM CHLORIDE 0.9 % IV SOLN
12.5000 mg | Freq: Four times a day (QID) | INTRAVENOUS | Status: AC
Start: 2024-01-19 — End: 2024-01-20
  Administered 2024-01-19 – 2024-01-20 (×4): 12.5 mg via INTRAVENOUS
  Filled 2024-01-19: qty 12.5
  Filled 2024-01-19: qty 0.5
  Filled 2024-01-19: qty 12.5
  Filled 2024-01-19: qty 0.5

## 2024-01-19 MED ORDER — ACETAMINOPHEN 325 MG PO TABS: 650.0000 mg | ORAL_TABLET | Freq: Four times a day (QID) | ORAL | Status: DC | PRN

## 2024-01-19 MED ORDER — CARVEDILOL 6.25 MG PO TABS
18.7500 mg | ORAL_TABLET | Freq: Two times a day (BID) | ORAL | Status: DC
  Administered 2024-01-20 (×2): 18.75 mg via ORAL
  Filled 2024-01-19 (×3): qty 1

## 2024-01-19 MED ORDER — ACETAMINOPHEN 650 MG RE SUPP: 650.0000 mg | Freq: Four times a day (QID) | RECTAL | Status: DC | PRN

## 2024-01-19 MED ORDER — LACTATED RINGERS IV BOLUS
1000.0000 mL | Freq: Once | INTRAVENOUS | Status: AC
  Administered 2024-01-19: 1000 mL via INTRAVENOUS

## 2024-01-19 MED ORDER — PANTOPRAZOLE SODIUM 40 MG IV SOLR
80.0000 mg | Freq: Once | INTRAVENOUS | Status: AC
  Administered 2024-01-19: 80 mg via INTRAVENOUS
  Filled 2024-01-19: qty 20

## 2024-01-19 MED ORDER — SODIUM CHLORIDE 0.9 % IV SOLN
12.5000 mg | Freq: Four times a day (QID) | INTRAVENOUS | Status: DC | PRN
  Administered 2024-01-20: 12.5 mg via INTRAVENOUS
  Filled 2024-01-19: qty 12.5

## 2024-01-19 MED ORDER — PROCHLORPERAZINE EDISYLATE 10 MG/2ML IJ SOLN: 5.0000 mg | Freq: Once | INTRAMUSCULAR | Status: DC

## 2024-01-19 MED ORDER — SODIUM CHLORIDE 0.9 % IV SOLN: 12.5000 mg | Freq: Four times a day (QID) | INTRAVENOUS | Status: DC | PRN

## 2024-01-19 MED ORDER — LACTATED RINGERS IV SOLN: INTRAVENOUS | Status: DC

## 2024-01-19 NOTE — Assessment & Plan Note (Signed)
 01-19-2024 will give LR bolus of 1L and start IVF @ 100 ml/hr. Check BMP in AM.  01-20-2024 improved with IVF. Bicarb up to 21. Was 18 yesterday. Remains on IVF due to NPO status.  01-21-2024 resolved. Stop IVF.

## 2024-01-19 NOTE — Assessment & Plan Note (Signed)
 01-19-2024 on Eliquis  due to prior hx of PE  01-20-2024 xarelto  still on hold.   01-21-2024 restart xarelto  at home.

## 2024-01-19 NOTE — Hospital Course (Signed)
 HPI:  73 year old African-American female history of metastatic breast cancer, chronic diastolic heart failure, hypertension, history of PE on chronic anticoagulation who presents to the ER today with onset of hematemesis around 7 AM.  She states that her first bout of emesis did not show any blood.  Subsequent vomiting started showing coffee-ground material.  She has been having nausea and vomiting nearly all day.  She came to the ER for evaluation.  On arrival tachycardic heart rate 115 blood pressure 164/63 satting 97% on room air.  White count 11.9, hemoglobin 10.8, platelets of 355  INR 1.6  Sodium 139, potassium 3.2, chloride of 101, CO2 18, BUN of 12, creatinine 1.14, calcium  8.8, total protein 8.4, albumin 2.7, AST of 15, ALT of 8, alk phos 75  Chest x-ray shows no acute cardiopulmonary disease.  EDP has sent secure chat message to Dr. Kimble Pennant with GI requesting consultation for hematemesis.  Patient was given 80 mg of IV Protonix.  [facility] hospitalist consulted for admission. Significant Events: Admitted 01/19/2024 for hematemesis   Admission Labs: WBC 11.9, HgB 10.8, plt 355 INR 1.6 Na 139, K 3.2, CO2 of 18, BUN 12, Scr 1.15, glu 107 Prot 8.4, alb 2.7, AST 15, ALT 8, alk phos 75, T. Bili 1.2  Admission Imaging Studies:   Significant Labs:   Significant Imaging Studies:   Antibiotic Therapy: Anti-infectives (From admission, onward)    None       Procedures: 01-21-2024 EGD negative  Consultants: GI

## 2024-01-19 NOTE — H&P (Signed)
 History and Physical    [name] ZOX:[id number] DOB: Dec 13, 1950 DOA: 01/19/2024  DOS: the patient was seen and examined on 01/19/2024  PCP: Olan Bering, MD   Patient coming from: Home  I have personally briefly reviewed patient's old medical records in [place] [facility]  HPI:  73 year old African-American female history of metastatic breast cancer, chronic diastolic heart failure, hypertension, history of PE on chronic anticoagulation who presents to the ER today with onset of hematemesis around 7 AM.  She states that her first bout of emesis did not show any blood.  Subsequent vomiting started showing coffee-ground material.  She has been having nausea and vomiting nearly all day.  She came to the ER for evaluation.  On arrival tachycardic heart rate 115 blood pressure 164/63 satting 97% on room air.  White count 11.9, hemoglobin 10.8, platelets of 355  INR 1.6  Sodium 139, potassium 3.2, chloride of 101, CO2 18, BUN of 12, creatinine 1.14, calcium  8.8, total protein 8.4, albumin 2.7, AST of 15, ALT of 8, alk phos 75  Chest x-ray shows no acute cardiopulmonary disease.  EDP has sent secure chat message to Dr. Kimble Pennant with GI requesting consultation for hematemesis.  Patient was given 80 mg of IV Protonix .  [facility] hospital[facility] consulted for admission. Significant Events: Admitted 01/19/2024 for hematemesis   Admission Labs: WBC 11.9, HgB 10.8, plt 355 INR 1.6 Na 139, K 3.2, CO2 of 18, BUN 12, Scr 1.15, glu 107 Prot 8.4, alb 2.7, AST 15, ALT 8, alk phos 75, T. Bili 1.2  Admission Imaging Studies:   Significant Labs:   Significant Imaging Studies:   Antibiotic Therapy: Anti-infectives (From admission, onward)    None       Procedures:   Consultants:   ED Course: given IV zofran . Given IV protonix . GI consulted.  Review of Systems:  Review of Systems  Constitutional: Negative.   HENT: Negative.    Eyes: Negative.   Respiratory: Negative.     Cardiovascular: Negative.   Gastrointestinal:  Positive for nausea and vomiting.       Hematemesis  Genitourinary: Negative.   Musculoskeletal:        Chronic knee pain  Skin: Negative.   Neurological: Negative.   Endo/Heme/Allergies: Negative.   Psychiatric/Behavioral: Negative.    All other systems reviewed and are negative.   Past Medical History:  Diagnosis Date   Anemia    pt pt report   Anxiety    Breast cancer (HCC)    mets to liver and lung   Breast cancer metastasized to multiple sites [facility]) 02/26/2013   Cellulitis    CHF (congestive heart failure) (HCC)    History of chemotherapy 09/2004   taxotere/herceptin /carboplatin   Hypertension    Neuropathy    Radiation 07/31/2006   left upper chest   Radiation 06/17/2006-06/27/2006   6480 cGy bilat. chest wall   SVC syndrome    Thrombosis     Past Surgical History:  Procedure Laterality Date   ANKLE SURGERY Left    BACK SURGERY     CHOLECYSTECTOMY  08/28/1987   MASS EXCISION Left 05/10/2022   Procedure: EXCISION OF LEFT CHEST WALL MASS;  Surgeon: Sim Dryer, MD;  Location: [facility] OR;  Service: General;  Laterality: Left;   MASTECTOMY Bilateral    w/ lymph node removal per patient   PERIPHERALLY INSERTED CENTRAL CATHETER INSERTION     PICC LINE INSERTION     PICC LINE REMOVAL ([facility] HX)     SURGICAL  EXCISION OF EXCESSIVE SKIN     TUBAL LIGATION  08/27/1984     reports that she has never smoked. She has never been exposed to tobacco smoke. She has never used smokeless tobacco. She reports current alcohol  use. She reports that she does not use drugs.  Allergies  Allergen Reactions   Penicillins Hives    PATIENT HAS TOLERATED CEPHALOSPORINGS   Adhesive [Tape] Other (See Comments)    Tears skin     Family History  Problem Relation Age of Onset   Heart failure Father    Cancer Father        Prostate cancer   Heart failure Brother    Cancer Brother        Prostate cancer   Diabetes Maternal Aunt      Prior to Admission medications   Medication Sig Start Date End Date Taking? Authorizing Provider  acetaminophen  (TYLENOL ) 500 MG tablet Take 1,000 mg by mouth every 6 (six) hours as needed for mild pain or fever.     [provider]  albuterol  (VENTOLIN  HFA) 108 (90 Base) MCG/ACT inhaler Inhale 2 puffs into the lungs every 6 (six) hours as needed for wheezing. 06/28/22   Gudena, Vinay, MD  ALPRAZolam  (XANAX ) 1 MG tablet Take 1 tablet (1 mg total) by mouth 3 (three) times daily as needed for anxiety. 12/31/23   Gudena, Vinay, MD  amLODipine  (NORVASC ) 10 MG tablet Take 1 tablet (10 mg total) by mouth every morning. 09/10/19   Magrinat, Rozella Cornfield, MD  apixaban  (ELIQUIS ) 5 MG TABS tablet Take 1 tablet (5 mg total) by mouth 2 (two) times daily. 08/06/23   Gudena, Vinay, MD  baclofen  (LIORESAL ) 10 MG tablet TAKE 1 TABLET BY MOUTH THREE TIMES DAILY AS NEEDED FOR MUSCLE SPASMS 05/19/21   Magrinat, Rozella Cornfield, MD  carvedilol  (COREG ) 12.5 MG tablet Take 1.5 tablets (18.75 mg total) by mouth 2 (two) times daily with a meal. 11/12/23   Darlis Eisenmenger, MD  cyclobenzaprine  (FLEXERIL ) 5 MG tablet Take 1 tablet (5 mg total) by mouth 2 (two) times daily as needed for muscle spasms. 12/31/23   Gudena, Vinay, MD  diclofenac  sodium (VOLTAREN ) 1 % GEL Apply 2 g topically daily as needed (for pain). Apply to knees and shoulders 08/03/13   Magrinat, Rozella Cornfield, MD  DULoxetine (CYMBALTA) 60 MG capsule Take 60 mg by mouth daily. 12/17/22   [provider]  furosemide  (LASIX ) 40 MG tablet Take 40 mg by mouth as needed.    [provider]  gabapentin  (NEURONTIN ) 300 MG capsule TAKE 2 CAPSULES BY MOUTH THREE TIMES DAILY 04/15/23   Cameron Cea, MD  lidocaine  (LIDODERM ) 5 % Place 1 patch onto the skin daily. Remove & Discard patch within 12 hours or as directed by MD 01/17/24   Wynetta Heckle, MD  losartan  (COZAAR ) 100 MG tablet Take 100 mg by mouth every morning.    [provider]  Multiple  Vitamins-Minerals (ZINC PO) Take by mouth daily.    [provider]  ondansetron  (ZOFRAN ) 8 MG tablet Take 1 tablet (8 mg total) by mouth every 8 (eight) hours as needed for nausea or vomiting. 08/30/22   Gudena, Vinay, MD  oxyCODONE -acetaminophen  (PERCOCET) 10-325 MG tablet Take 1 tablet by mouth 4 (four) times daily as needed. 08/17/19   [provider]  oxyCODONE -acetaminophen  (PERCOCET/ROXICET) 5-325 MG tablet Take 1-2 tablets by mouth every 6 (six) hours as needed for severe pain (pain score 7-10). 01/17/24   Wynetta Heckle,  MD  potassium chloride  SA (KLOR-CON ) 20 MEQ tablet Take 1 tablet (20 mEq total) by mouth daily as needed. With use of lasix  05/10/21   Darlis Eisenmenger, MD  predniSONE (DELTASONE) 5 MG tablet Take 5 mg by mouth daily in the afternoon.    [provider]  rosuvastatin  (CRESTOR ) 10 MG tablet Take 1 tablet (10 mg total) by mouth daily. 11/14/23 11/13/24  Darlis Eisenmenger, MD  spironolactone  (ALDACTONE ) 25 MG tablet Take 1 tablet daily 10/31/22   McLean, Dalton S, MD  temazepam  (RESTORIL ) 30 MG capsule Take 1 capsule (30 mg total) by mouth at bedtime. 08/30/23   Cameron Cea, MD    Physical Exam: Vitals:   01/19/24 1518 01/19/24 1521 01/19/24 1700  BP: (!) 164/63  (!) 151/88  Pulse: (!) 115  100  Resp: (!) 21  18  Temp: 98.7 F (37.1 C)    TempSrc: Oral    SpO2: 97%  98%  Weight:  111.6 kg   Height:  5\' 3"  (1.6 m)     Physical Exam Vitals and nursing note reviewed.  Constitutional:      Appearance: She is obese.  HENT:     Head: Normocephalic and atraumatic.     Nose: Nose normal.  Eyes:     General: No scleral icterus. Cardiovascular:     Rate and Rhythm: Regular rhythm. Tachycardia present.  Pulmonary:     Effort: Pulmonary effort is normal.     Breath sounds: Normal breath sounds.  Abdominal:     General: Abdomen is protuberant. Bowel sounds are normal. There is no distension.     Palpations: Abdomen is soft.     Tenderness: There  is no abdominal tenderness.  Musculoskeletal:     Comments: Non-pitting LE edema  Skin:    General: Skin is warm and dry.     Capillary Refill: Capillary refill takes less than 2 seconds.  Neurological:     Mental Status: She is alert and oriented to person, place, and time.    Labs on Admission: I have personally reviewed following labs and imaging studies  CBC: Recent Labs  Lab 01/19/24 1529  WBC 11.9*  NEUTROABS 8.2*  HGB 10.8*  HCT 31.1*  MCV 80.2  PLT 355   Basic Metabolic Panel: Recent Labs  Lab 01/19/24 1529  NA 139  K 3.2*  CL 101  CO2 18*  GLUCOSE 107*  BUN 12  CREATININE 1.14*  CALCIUM  8.8*   GFR: Estimated Creatinine Clearance: 53.6 mL/min (A) (by C-G formula based on SCr of 1.14 mg/dL (H)). Liver Function Tests: Recent Labs  Lab 01/19/24 1529  AST 15  ALT 8  ALKPHOS 75  BILITOT 1.2  PROT 8.4*  ALBUMIN 2.7*   Coagulation Profile: Recent Labs  Lab 01/19/24 1529  INR 1.6*   BNP (last 3 results) Recent Labs    11/12/23 1428  BNP 64.6   Radiological Exams on Admission: I have personally reviewed images DG Chest Port 1 View Result Date: 01/19/2024 CLINICAL DATA:  [id number] Epigastric abdominal pain [id number] EXAM: PORTABLE CHEST - 1 VIEW COMPARISON:  06/16/2014 FINDINGS: Lungs are clear. Stable right IJ port catheter to the cavoatrial junction. Heart size and mediastinal contours are within normal limits. Surgical clips bilateral axillae. No effusion. Left shoulder DJD. IMPRESSION: No acute cardiopulmonary disease. Electronically Signed   By: Nicoletta Barrier M.D.   On: 01/19/2024 16:11   CT Hip Left Wo Contrast Result Date: 01/17/2024 CLINICAL DATA:  Hip pain, stress  fracture suspected, negative x-ray EXAM: CT OF THE LEFT HIP WITHOUT CONTRAST TECHNIQUE: Multidetector CT imaging of the left hip was performed according to the standard protocol. Multiplanar CT image reconstructions were also generated. RADIATION DOSE REDUCTION: This exam was performed  according to the departmental dose-optimization program which includes automated exposure control, adjustment of the mA and/or kV according to patient size and/or use of iterative reconstruction technique. COMPARISON:  Radiograph 12/26/2023 and CT 11/14/2023 FINDINGS: Bones/Joint/Cartilage No acute fracture or dislocation of the left hip. Loss of cortical distinction and erosive change about the left SI joint extending into the left sacral ala and left iliac wing. This is new compared to 11/14/2023. There is associated thickening of the left iliacus muscle. No discrete fluid collection or abscess though evaluation is limited without IV contrast. Ligaments Suboptimally assessed by CT. Muscles and Tendons Thickening of the left iliacus muscle adjacent to the erosive change in the left SI joint without defined fluid collection. Soft tissues Unremarkable. IMPRESSION: Loss of cortical definition and erosions about the left SI joint. Associated thickening/edema of the left iliacus muscle. These findings are new compared to 11/14/2023. Primary differential considerations are septic sacroiliitis with myositis of the iliacus muscle or infiltrative mass/metastasis in the left SI joint and hematoma in the left iliacus. Electronically Signed   By: Rozell Cornet M.D.   On: 01/17/2024 22:22    EKG: My personal interpretation of EKG shows: sinus tachycardia. 1st AVB   Assessment/Plan Principal Problem:   Hematemesis Active Problems:   Acute metabolic acidosis   Essential hypertension   Breast cancer metastasized to multiple sites (HCC)   Obesity, Class III, BMI 40-49.9 (morbid obesity)   History of pulmonary embolism   Chronic anticoagulation   DNR (do not resuscitate).DNI(Do Not Intubate)   Hypokalemia   Assessment and Plan: * Hematemesis 01-19-2024 hematemesis started suddenly this AM. GI consulted by EDP(Outlaw). Start protonix 40 mg IV q12h. Start IVF. NPO except meds/ice chips  Acute metabolic  acidosis 01-19-2024 will give LR bolus of 1L and start IVF @ 100 ml/hr. Check BMP in AM.  Hypokalemia 01-19-2024 replete with IV Kcl due to hematemesis. Repeat BMP in AM.  DNR (do not resuscitate).DNI(Do Not Intubate) 01-19-2024 verified with pt, pt's husband and pt's dtr that pt is a DNR/DNI. This is consistent with her prior wishes.  Chronic anticoagulation 01-19-2024 on Eliquis  due to prior hx of PE  History of pulmonary embolism 01-19-2024 at home pt on Xarelto . Holding DOAC during hematemesis evaluation. SCD for DVT prophylaxis.  Obesity, Class III, BMI 40-49.9 (morbid obesity) Body mass index is 43.58 kg/m.   Breast cancer metastasized to multiple sites [facility]) 01-19-2024 pt followed by oncology. Gets chemo every 3-4 weeks. Husband states pt is getting ready to get chemo again next week.  Essential hypertension 01-19-2024 while NPO, will give IV lopressor . Hold lasix  and cozaar  given blood loss.   DVT prophylaxis: SCDs Code Status: DNR/DNI(Do NOT Intubate) Family Communication: discussed with husband and pt's dtr  Disposition Plan: return home  Consults called: GI(Outlaw) consulted by EDP  Admission status: Observation, Telemetry bed   Unk Garb, DO [facility] 01/19/2024, 5:49 PM

## 2024-01-19 NOTE — Assessment & Plan Note (Signed)
 01-19-2024 replete with IV Kcl due to hematemesis. Repeat BMP in AM.  01-21-2024 pt given 30 meq IV kcl over 3 hours today,.

## 2024-01-19 NOTE — Assessment & Plan Note (Signed)
 01-19-2024 at home pt on Xarelto . Holding DOAC during hematemesis evaluation. SCD for DVT prophylaxis.  01-20-2024 xarelto  still on hold.  01-21-2024 restart xarelto  at home.

## 2024-01-19 NOTE — Assessment & Plan Note (Signed)
 01-19-2024 while NPO, will give IV lopressor . Hold lasix  and cozaar  given blood loss.  01-20-2024 continue coreg . No AKI. Will restart norvasc   01-21-2024 can restart her home HTN meds at home.

## 2024-01-19 NOTE — Assessment & Plan Note (Signed)
 01-19-2024 pt followed by oncology. Gets chemo every 3-4 weeks. Husband states pt is getting ready to get chemo again next week.  01-21-2024 stable. Follows with oncology

## 2024-01-19 NOTE — Assessment & Plan Note (Signed)
 01-19-2024 hematemesis started suddenly this AM. GI consulted by EDP(Outlaw). Start protonix 40 mg IV q12h. Start IVF. NPO except meds/ice chips  01-20-2024 remains on BID IV protonix. awaiting GI consult.  Despite IVF overnight, her HgB has actually increased since yesterday!.  Yesterday, HgB 10.8.  after 1 L bolus of IVF and 100 ml/hr overnight, today's HgB up to 11 g/dl. No further hematemesis since last night.  BUN remains low at 7. Yesterday BUN was 12. I would have expect her BUN to rise with significant UGI bleeding.  The fact that her HgB has INCREASED even with IVF resuscitation means to me that her reported hematemesis is not significant. Combined with the fact that her BUN has not increased at all since last night, I seriously doubt she has any UGI pathology. Awaiting for GI consult.  Hopefully she doesn't need an EGD and I can feed her and discharge to home soon.  01-21-2024 resolved. EGD negative.

## 2024-01-19 NOTE — Assessment & Plan Note (Signed)
Body mass index is 43.58 kg/m.

## 2024-01-19 NOTE — Assessment & Plan Note (Signed)
 01-19-2024 verified with pt, pt's husband and pt's dtr that pt is a DNR/DNI. This is consistent with her prior wishes.

## 2024-01-19 NOTE — ED Triage Notes (Addendum)
 Pt BIBA for nausea and emesis, coffee ground appearance.  Denies diarrhea.   Pt has hx of blood clots and breast cancer and is on eliquis   EMS accessed portacath for pt and administered zofran , 4mg .

## 2024-01-20 DIAGNOSIS — Z66 Do not resuscitate: Secondary | ICD-10-CM

## 2024-01-20 DIAGNOSIS — Z79899 Other long term (current) drug therapy: Secondary | ICD-10-CM | POA: Diagnosis not present

## 2024-01-20 DIAGNOSIS — K3189 Other diseases of stomach and duodenum: Secondary | ICD-10-CM | POA: Diagnosis present

## 2024-01-20 DIAGNOSIS — K920 Hematemesis: Principal | ICD-10-CM

## 2024-01-20 DIAGNOSIS — E876 Hypokalemia: Secondary | ICD-10-CM | POA: Diagnosis present

## 2024-01-20 DIAGNOSIS — Z8249 Family history of ischemic heart disease and other diseases of the circulatory system: Secondary | ICD-10-CM | POA: Diagnosis not present

## 2024-01-20 DIAGNOSIS — E8721 Acute metabolic acidosis: Secondary | ICD-10-CM | POA: Diagnosis present

## 2024-01-20 DIAGNOSIS — C50919 Malignant neoplasm of unspecified site of unspecified female breast: Secondary | ICD-10-CM | POA: Diagnosis present

## 2024-01-20 DIAGNOSIS — Z91410 Personal history of adult physical and sexual abuse: Secondary | ICD-10-CM | POA: Diagnosis not present

## 2024-01-20 DIAGNOSIS — C7951 Secondary malignant neoplasm of bone: Secondary | ICD-10-CM | POA: Diagnosis present

## 2024-01-20 DIAGNOSIS — Z7901 Long term (current) use of anticoagulants: Secondary | ICD-10-CM

## 2024-01-20 DIAGNOSIS — E66813 Obesity, class 3: Secondary | ICD-10-CM | POA: Diagnosis present

## 2024-01-20 DIAGNOSIS — Z9013 Acquired absence of bilateral breasts and nipples: Secondary | ICD-10-CM | POA: Diagnosis not present

## 2024-01-20 DIAGNOSIS — Z91048 Other nonmedicinal substance allergy status: Secondary | ICD-10-CM | POA: Diagnosis not present

## 2024-01-20 DIAGNOSIS — Z6841 Body Mass Index (BMI) 40.0 and over, adult: Secondary | ICD-10-CM | POA: Diagnosis not present

## 2024-01-20 DIAGNOSIS — Z880 Allergy status to penicillin: Secondary | ICD-10-CM | POA: Diagnosis not present

## 2024-01-20 DIAGNOSIS — Z86711 Personal history of pulmonary embolism: Secondary | ICD-10-CM

## 2024-01-20 DIAGNOSIS — C7800 Secondary malignant neoplasm of unspecified lung: Secondary | ICD-10-CM | POA: Diagnosis present

## 2024-01-20 DIAGNOSIS — F419 Anxiety disorder, unspecified: Secondary | ICD-10-CM | POA: Diagnosis present

## 2024-01-20 DIAGNOSIS — I10 Essential (primary) hypertension: Secondary | ICD-10-CM | POA: Diagnosis not present

## 2024-01-20 DIAGNOSIS — K2970 Gastritis, unspecified, without bleeding: Secondary | ICD-10-CM | POA: Diagnosis not present

## 2024-01-20 DIAGNOSIS — Z833 Family history of diabetes mellitus: Secondary | ICD-10-CM | POA: Diagnosis not present

## 2024-01-20 DIAGNOSIS — I110 Hypertensive heart disease with heart failure: Secondary | ICD-10-CM | POA: Diagnosis present

## 2024-01-20 DIAGNOSIS — C787 Secondary malignant neoplasm of liver and intrahepatic bile duct: Secondary | ICD-10-CM | POA: Diagnosis present

## 2024-01-20 DIAGNOSIS — I5032 Chronic diastolic (congestive) heart failure: Secondary | ICD-10-CM | POA: Diagnosis present

## 2024-01-20 LAB — CBC WITH DIFFERENTIAL/PLATELET
Abs Immature Granulocytes: 0.04 10*3/uL (ref 0.00–0.07)
Basophils Absolute: 0.1 10*3/uL (ref 0.0–0.1)
Basophils Relative: 1 %
Eosinophils Absolute: 0 10*3/uL (ref 0.0–0.5)
Eosinophils Relative: 0 %
HCT: 32.7 % — ABNORMAL LOW (ref 36.0–46.0)
Hemoglobin: 11 g/dL — ABNORMAL LOW (ref 12.0–15.0)
Immature Granulocytes: 0 %
Lymphocytes Relative: 29 %
Lymphs Abs: 3.2 10*3/uL (ref 0.7–4.0)
MCH: 27.2 pg (ref 26.0–34.0)
MCHC: 33.6 g/dL (ref 30.0–36.0)
MCV: 80.7 fL (ref 80.0–100.0)
Monocytes Absolute: 1.2 10*3/uL — ABNORMAL HIGH (ref 0.1–1.0)
Monocytes Relative: 11 %
Neutro Abs: 6.6 10*3/uL (ref 1.7–7.7)
Neutrophils Relative %: 59 %
Platelets: 336 10*3/uL (ref 150–400)
RBC: 4.05 MIL/uL (ref 3.87–5.11)
RDW: 14.3 % (ref 11.5–15.5)
WBC: 11.1 10*3/uL — ABNORMAL HIGH (ref 4.0–10.5)
nRBC: 0 % (ref 0.0–0.2)

## 2024-01-20 LAB — MAGNESIUM: Magnesium: 1.1 mg/dL — ABNORMAL LOW (ref 1.7–2.4)

## 2024-01-20 LAB — COMPREHENSIVE METABOLIC PANEL WITH GFR
ALT: 9 U/L (ref 0–44)
AST: 14 U/L — ABNORMAL LOW (ref 15–41)
Albumin: 2.7 g/dL — ABNORMAL LOW (ref 3.5–5.0)
Alkaline Phosphatase: 66 U/L (ref 38–126)
Anion gap: 19 — ABNORMAL HIGH (ref 5–15)
BUN: 7 mg/dL — ABNORMAL LOW (ref 8–23)
CO2: 21 mmol/L — ABNORMAL LOW (ref 22–32)
Calcium: 8.7 mg/dL — ABNORMAL LOW (ref 8.9–10.3)
Chloride: 95 mmol/L — ABNORMAL LOW (ref 98–111)
Creatinine, Ser: 0.97 mg/dL (ref 0.44–1.00)
GFR, Estimated: >60 mL/min (ref >60)
Glucose, Bld: 109 mg/dL — ABNORMAL HIGH (ref 70–99)
Potassium: 3.1 mmol/L — ABNORMAL LOW (ref 3.5–5.1)
Sodium: 135 mmol/L (ref 135–145)
Total Bilirubin: 1.6 mg/dL — ABNORMAL HIGH (ref 0.0–1.2)
Total Protein: 8.1 g/dL (ref 6.5–8.1)

## 2024-01-20 MED ORDER — LOPERAMIDE HCL 2 MG PO CAPS
4.0000 mg | ORAL_CAPSULE | Freq: Once | ORAL | Status: AC
  Administered 2024-01-20: 4 mg via ORAL
  Filled 2024-01-20: qty 2

## 2024-01-20 MED ORDER — AMLODIPINE BESYLATE 10 MG PO TABS
10.0000 mg | ORAL_TABLET | Freq: Every morning | ORAL | Status: DC
  Administered 2024-01-20 – 2024-01-21 (×2): 10 mg via ORAL
  Filled 2024-01-20 (×2): qty 1

## 2024-01-20 MED ORDER — ONDANSETRON HCL 4 MG PO TABS
4.0000 mg | ORAL_TABLET | Freq: Once | ORAL | Status: AC
  Administered 2024-01-20: 4 mg via ORAL
  Filled 2024-01-20: qty 1

## 2024-01-20 MED ORDER — MAGNESIUM SULFATE 2 GM/50ML IV SOLN: 2.0000 g | Freq: Once | INTRAVENOUS | Status: DC

## 2024-01-20 MED ORDER — POTASSIUM CHLORIDE 10 MEQ/100ML IV SOLN
10.0000 meq | INTRAVENOUS | Status: AC
  Administered 2024-01-20 (×3): 10 meq via INTRAVENOUS
  Filled 2024-01-20 (×3): qty 100

## 2024-01-20 MED ORDER — MELATONIN 5 MG PO TABS
5.0000 mg | ORAL_TABLET | Freq: Once | ORAL | Status: AC
  Administered 2024-01-20: 5 mg via ORAL
  Filled 2024-01-20: qty 1

## 2024-01-20 MED ORDER — METOPROLOL TARTRATE 5 MG/5ML IV SOLN: 5.0000 mg | Freq: Once | INTRAVENOUS | Status: DC

## 2024-01-20 MED ORDER — PHENOL 1.4 % MT LIQD
2.0000 | OROMUCOSAL | Status: DC | PRN
  Filled 2024-01-20: qty 177

## 2024-01-20 MED ORDER — MAGNESIUM SULFATE 4 GM/100ML IV SOLN
4.0000 g | Freq: Once | INTRAVENOUS | Status: AC
  Administered 2024-01-20: 4 g via INTRAVENOUS
  Filled 2024-01-20: qty 100
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
## 2024-01-20 NOTE — Plan of Care (Signed)

## 2024-01-20 NOTE — Progress Notes (Signed)
 PROGRESS NOTE    [name]  WUJ:[id number] DOB: [birth year] DOA: 01/19/2024 PCP: Olan Bering, MD  Subjective: Pt seen and examined. Despite IVF overnight, her HgB has actually increased since yesterday!.  Yesterday, HgB 10.8.  after 1 L bolus of IVF and 100 ml/hr overnight, today's HgB up to 11 g/dl. No further hematemesis since last night.  BUN remains low at 7. Yesterday BUN was 12.   Hospital Course: HPI:  73 year old African-American female history of metastatic breast cancer, chronic diastolic heart failure, hypertension, history of PE on chronic anticoagulation who presents to the ER today with onset of hematemesis around 7 AM.  She states that her first bout of emesis did not show any blood.  Subsequent vomiting started showing coffee-ground material.  She has been having nausea and vomiting nearly all day.  She came to the ER for evaluation.  On arrival tachycardic heart rate 115 blood pressure 164/63 satting 97% on room air.  White count 11.9, hemoglobin 10.8, platelets of 355  INR 1.6  Sodium 139, potassium 3.2, chloride of 101, CO2 18, BUN of 12, creatinine 1.14, calcium  8.8, total protein 8.4, albumin 2.7, AST of 15, ALT of 8, alk phos 75  Chest x-ray shows no acute cardiopulmonary disease.  EDP has sent secure chat message to Dr. Kimble Pennant with GI requesting consultation for hematemesis.  Patient was given 80 mg of IV Protonix .  [facility] consulted for admission. Significant Events: Admitted 01/19/2024 for hematemesis   Admission Labs: WBC 11.9, HgB 10.8, plt 355 INR 1.6 Na 139, K 3.2, CO2 of 18, BUN 12, Scr 1.15, glu 107 Prot 8.4, alb 2.7, AST 15, ALT 8, alk phos 75, T. Bili 1.2  Admission Imaging Studies:   Significant Labs:   Significant Imaging Studies:   Antibiotic Therapy: Anti-infectives (From admission, onward)    None       Procedures:   Consultants:     Assessment and Plan: * Hematemesis 01-19-2024 hematemesis  started suddenly this AM. GI consulted by EDP(Outlaw). Start protonix  40 mg IV q12h. Start IVF. NPO except meds/ice chips  01-20-2024 remains on BID IV protonix . awaiting GI consult.  Despite IVF overnight, her HgB has actually increased since yesterday!.  Yesterday, HgB 10.8.  after 1 L bolus of IVF and 100 ml/hr overnight, today's HgB up to 11 g/dl. No further hematemesis since last night.  BUN remains low at 7. Yesterday BUN was 12. I would have expect her BUN to rise with significant UGI bleeding.  The fact that her HgB has INCREASED even with IVF resuscitation means to me that her reported hematemesis is not significant. Combined with the fact that her BUN has not increased at all since last night, I seriously doubt she has any UGI pathology. Awaiting for GI consult.  Hopefully she doesn't need an EGD and I can feed her and discharge to home soon.  Acute metabolic acidosis 01-19-2024 will give LR bolus of 1L and start IVF @ 100 ml/hr. Check BMP in AM.  01-20-2024 improved with IVF. Bicarb up to 21. Was 18 yesterday. Remains on IVF due to NPO status.  Hypomagnesemia 01-20-2024 replete with IV Magnesium   Hypokalemia 01-19-2024 replete with IV Kcl due to hematemesis. Repeat BMP in AM.  DNR (do not resuscitate).DNI(Do Not Intubate) 01-19-2024 verified with pt, pt's husband and pt's dtr that pt is a DNR/DNI. This is consistent with her prior wishes.  Chronic anticoagulation 01-19-2024 on Eliquis  due to prior hx of PE  01-20-2024 xarelto  still on  hold.   History of pulmonary embolism 01-19-2024 at home pt on Xarelto . Holding DOAC during hematemesis evaluation. SCD for DVT prophylaxis.  01-20-2024 xarelto  still on hold.  Obesity, Class III, BMI 40-49.9 (morbid obesity) Body mass index is 43.58 kg/m.   Breast cancer metastasized to multiple sites [facility]) 01-19-2024 pt followed by oncology. Gets chemo every 3-4 weeks. Husband states pt is getting ready to get chemo again next  week.  Essential hypertension 01-19-2024 while NPO, will give IV lopressor . Hold lasix  and cozaar  given blood loss.  01-20-2024 continue coreg . No AKI. Will restart norvasc    DVT prophylaxis: SCDs Start: 01/19/24 2011    Code Status: Limited: Do not attempt resuscitation (DNR) -DNR-LIMITED -Do Not Intubate/DNI  Family Communication: discussed with pt, husband, dtr at bedside Disposition Plan: return home Reason for continuing need for hospitalization: remains on IVF, IV protonix, NPO. Awaiting GI consult.  Objective: Vitals:   01/19/24 2224 01/20/24 0240 01/20/24 0553 01/20/24 0831  BP: (!) 164/88 (!) 152/84 (!) 150/85 (!) 138/101  Pulse: 99 (!) 109 (!) 109 (!) 123  Resp: 20 18 18    Temp: (!) 100.8 F (38.2 C) 100.1 F (37.8 C) 99.3 F (37.4 C) 98.5 F (36.9 C)  TempSrc: Oral Oral Oral   SpO2: 93% 97% 96% 94%  Weight:      Height:        Intake/Output Summary (Last 24 hours) at 01/20/2024 1237 Last data filed at 01/20/2024 0857 Gross per 24 hour  Intake 0 ml  Output --  Net 0 ml   Filed Weights   01/19/24 1521  Weight: 111.6 kg    Examination:  Physical Exam Vitals and nursing note reviewed.  Constitutional:      Appearance: She is obese.  HENT:     Head: Normocephalic and atraumatic.  Eyes:     General: No scleral icterus. Cardiovascular:     Rate and Rhythm: Normal rate and regular rhythm.  Pulmonary:     Effort: Pulmonary effort is normal. No respiratory distress.     Breath sounds: Normal breath sounds.  Abdominal:     General: Bowel sounds are normal. There is no distension.     Palpations: Abdomen is soft.  Musculoskeletal:     Right lower leg: Edema present.     Left lower leg: Edema present.     Comments: Right ant chest wall port-a-cath  Bilateral non-pitting edema of both lower legs.  Skin:    General: Skin is warm and dry.     Capillary Refill: Capillary refill takes less than 2 seconds.  Neurological:     General: No focal deficit  present.     Mental Status: She is alert and oriented to person, place, and time.     Data Reviewed: I have personally reviewed following labs and imaging studies  CBC: Recent Labs  Lab 01/19/24 1529 01/20/24 0513  WBC 11.9* 11.1*  NEUTROABS 8.2* 6.6  HGB 10.8* 11.0*  HCT 31.1* 32.7*  MCV 80.2 80.7  PLT 355 336   Basic Metabolic Panel: Recent Labs  Lab 01/19/24 1529 01/20/24 0513  NA 139 135  K 3.2* 3.1*  CL 101 95*  CO2 18* 21*  GLUCOSE 107* 109*  BUN 12 7*  CREATININE 1.14* 0.97  CALCIUM  8.8* 8.7*  MG  --  1.1*   GFR: Estimated Creatinine Clearance: 63 mL/min (by C-G formula based on SCr of 0.97 mg/dL). Liver Function Tests: Recent Labs  Lab 01/19/24 1529 01/20/24 0513  AST 15  14*  ALT 8 9  ALKPHOS 75 66  BILITOT 1.2 1.6*  PROT 8.4* 8.1  ALBUMIN 2.7* 2.7*   Coagulation Profile: Recent Labs  Lab 01/19/24 1529  INR 1.6*   BNP (last 3 results) Recent Labs    11/12/23 1428  BNP 64.6    Radiology Studies: DG Chest Port 1 View Result Date: 01/19/2024 CLINICAL DATA:  [id number] Epigastric abdominal pain [id number] EXAM: PORTABLE CHEST - 1 VIEW COMPARISON:  06/16/2014 FINDINGS: Lungs are clear. Stable right IJ port catheter to the cavoatrial junction. Heart size and mediastinal contours are within normal limits. Surgical clips bilateral axillae. No effusion. Left shoulder DJD. IMPRESSION: No acute cardiopulmonary disease. Electronically Signed   By: Nicoletta Barrier M.D.   On: 01/19/2024 16:11    Scheduled Meds:  amLODipine   10 mg Oral q morning   carvedilol   18.75 mg Oral BID WC   pantoprazole (PROTONIX) IV  40 mg Intravenous Q12H   temazepam   30 mg Oral QHS   Continuous Infusions:  lactated ringers  100 mL/hr at 01/20/24 0554   magnesium sulfate bolus IVPB 4 g (01/20/24 1202)   promethazine (PHENERGAN) injection (IM or IVPB)       LOS: 0 days   Time spent: 50 minutes  Unk Garb, DO  [facility]  01/20/2024, 12:37 PM

## 2024-01-20 NOTE — Subjective & Objective (Addendum)
 Pt seen and examined. Despite IVF overnight, her HgB has actually increased since yesterday!.  Yesterday, HgB 10.8.  after 1 L bolus of IVF and 100 ml/hr overnight, today's HgB up to 11 g/dl. No further hematemesis since last night.  BUN remains low at 7. Yesterday BUN was 12.

## 2024-01-20 NOTE — Assessment & Plan Note (Signed)
 01-20-2024 replete with IV Magnesium  01-21-2024 pt given 2 grams IV mag this AM.

## 2024-01-20 NOTE — ED Provider Notes (Signed)
 [facility] Provider Note   CSN: [id number] Arrival date & time: 01/19/24  1454     History  Chief Complaint  Patient presents with   Hematemesis    [name] is a 73 y.o. female.  HPI     73 year old African-American female history of metastatic breast cancer, chronic diastolic heart failure, hypertension, history of PE on chronic anticoagulation who presents to the ER today with onset of hematemesis around 7 AM. She states that her first bout of emesis did not show any blood. Subsequent vomiting started showing coffee-ground material.  Patient suspects that she has had at least 10, possibly 20 episodes of emesis with half a cup to cup worth of coffee-ground emesis output.  She does not have any associated abdominal pain.  Patient has no history of bloody stools/GI bleeding, known peptic ulcer disease, liver disease.  Patient does not take any significant amount of NSAIDs or prednisone, but she is on Eliquis .  Her last dose was last night.  Review of system is positive for dizziness.  Home Medications Prior to Admission medications   Medication Sig Start Date End Date Taking? Authorizing Provider  morphine  (MS CONTIN ) 15 MG 12 hr tablet Take 15 mg by mouth every 12 (twelve) hours. 12/30/23  Yes [provider]  acetaminophen  (TYLENOL ) 500 MG tablet Take 1,000 mg by mouth every 6 (six) hours as needed for mild pain or fever.     [provider]  albuterol  (VENTOLIN  HFA) 108 (90 Base) MCG/ACT inhaler Inhale 2 puffs into the lungs every 6 (six) hours as needed for wheezing. 06/28/22   Gudena, Vinay, MD  ALPRAZolam  (XANAX ) 1 MG tablet Take 1 tablet (1 mg total) by mouth 3 (three) times daily as needed for anxiety. 12/31/23   Gudena, Vinay, MD  amLODipine  (NORVASC ) 10 MG tablet Take 1 tablet (10 mg total) by mouth every morning. 09/10/19   Magrinat, Rozella Cornfield, MD  apixaban  (ELIQUIS ) 5 MG TABS tablet Take 1 tablet (5 mg total) by  mouth 2 (two) times daily. 08/06/23   Gudena, Vinay, MD  baclofen  (LIORESAL ) 10 MG tablet TAKE 1 TABLET BY MOUTH THREE TIMES DAILY AS NEEDED FOR MUSCLE SPASMS 05/19/21   Magrinat, Rozella Cornfield, MD  carvedilol  (COREG ) 12.5 MG tablet Take 1.5 tablets (18.75 mg total) by mouth 2 (two) times daily with a meal. 11/12/23   Darlis Eisenmenger, MD  cyclobenzaprine  (FLEXERIL ) 5 MG tablet Take 1 tablet (5 mg total) by mouth 2 (two) times daily as needed for muscle spasms. 12/31/23   Gudena, Vinay, MD  diclofenac  sodium (VOLTAREN ) 1 % GEL Apply 2 g topically daily as needed (for pain). Apply to knees and shoulders 08/03/13   Magrinat, Rozella Cornfield, MD  DULoxetine (CYMBALTA) 60 MG capsule Take 60 mg by mouth daily. 12/17/22   [provider]  furosemide  (LASIX ) 40 MG tablet Take 40 mg by mouth as needed.    [provider]  gabapentin  (NEURONTIN ) 300 MG capsule TAKE 2 CAPSULES BY MOUTH THREE TIMES DAILY 04/15/23   Cameron Cea, MD  lidocaine  (LIDODERM ) 5 % Place 1 patch onto the skin daily. Remove & Discard patch within 12 hours or as directed by MD 01/17/24   Wynetta Heckle, MD  losartan  (COZAAR ) 100 MG tablet Take 100 mg by mouth every morning.    [provider]  Multiple Vitamins-Minerals (ZINC PO) Take by mouth daily.    [provider]  ondansetron  (ZOFRAN ) 8 MG  tablet Take 1 tablet (8 mg total) by mouth every 8 (eight) hours as needed for nausea or vomiting. 08/30/22   Gudena, Vinay, MD  oxyCODONE -acetaminophen  (PERCOCET) 10-325 MG tablet Take 1 tablet by mouth 4 (four) times daily as needed. 08/17/19   [provider]  oxyCODONE -acetaminophen  (PERCOCET/ROXICET) 5-325 MG tablet Take 1-2 tablets by mouth every 6 (six) hours as needed for severe pain (pain score 7-10). 01/17/24   Wynetta Heckle, MD  potassium chloride  SA (KLOR-CON ) 20 MEQ tablet Take 1 tablet (20 mEq total) by mouth daily as needed. With use of lasix  05/10/21   Darlis Eisenmenger, MD  predniSONE (DELTASONE) 5 MG  tablet Take 5 mg by mouth daily in the afternoon.    [provider]  rosuvastatin  (CRESTOR ) 10 MG tablet Take 1 tablet (10 mg total) by mouth daily. 11/14/23 11/13/24  Darlis Eisenmenger, MD  spironolactone  (ALDACTONE ) 25 MG tablet Take 1 tablet daily 10/31/22   McLean, Dalton S, MD  temazepam  (RESTORIL ) 30 MG capsule Take 1 capsule (30 mg total) by mouth at bedtime. 08/30/23   Gudena, Vinay, MD      Allergies    Adhesive [tape] and Penicillins    Review of Systems   Review of Systems  All other systems reviewed and are negative.   Physical Exam Updated Vital Signs BP (!) 151/90   Pulse 92   Temp 98.6 F (37 C)   Resp 18   Ht 5\' 3"  (1.6 m)   Wt 111.6 kg   SpO2 98%   BMI 43.58 kg/m  Physical Exam Vitals and nursing note reviewed.  Constitutional:      Appearance: She is well-developed.  HENT:     Head: Atraumatic.  Eyes:     Extraocular Movements: Extraocular movements intact.     Pupils: Pupils are equal, round, and reactive to light.  Cardiovascular:     Rate and Rhythm: Tachycardia present.  Pulmonary:     Effort: Pulmonary effort is normal.  Abdominal:     Tenderness: There is no abdominal tenderness.  Musculoskeletal:     Cervical back: Normal range of motion and neck supple.  Skin:    General: Skin is warm and dry.  Neurological:     Mental Status: She is alert and oriented to person, place, and time.     ED Results / Procedures / Treatments   Labs (all labs ordered are listed, but only abnormal results are displayed) Labs Reviewed  COMPREHENSIVE METABOLIC PANEL WITH GFR - Abnormal; Notable for the following components:      Result Value   Potassium 3.2 (*)    CO2 18 (*)    Glucose, Bld 107 (*)    Creatinine, Ser 1.14 (*)    Calcium  8.8 (*)    Total Protein 8.4 (*)    Albumin 2.7 (*)    GFR, Estimated 51 (*)    Anion gap 20 (*)    All other components within normal limits  CBC WITH DIFFERENTIAL/PLATELET - Abnormal; Notable for the following  components:   WBC 11.9 (*)    Hemoglobin 10.8 (*)    HCT 31.1 (*)    Neutro Abs 8.2 (*)    All other components within normal limits  PROTIME-INR - Abnormal; Notable for the following components:   Prothrombin Time 19.6 (*)    INR 1.6 (*)    All other components within normal limits  CBC WITH DIFFERENTIAL/PLATELET - Abnormal; Notable for the following components:   WBC 11.1 (*)  Hemoglobin 11.0 (*)    HCT 32.7 (*)    Monocytes Absolute 1.2 (*)    All other components within normal limits  COMPREHENSIVE METABOLIC PANEL WITH GFR - Abnormal; Notable for the following components:   Potassium 3.1 (*)    Chloride 95 (*)    CO2 21 (*)    Glucose, Bld 109 (*)    BUN 7 (*)    Calcium  8.7 (*)    Albumin 2.7 (*)    AST 14 (*)    Total Bilirubin 1.6 (*)    Anion gap 19 (*)    All other components within normal limits  MAGNESIUM - Abnormal; Notable for the following components:   Magnesium 1.1 (*)    All other components within normal limits  TYPE AND SCREEN  ABO/RH    EKG EKG Interpretation Date/Time:  Sunday Jan 19 2024 15:21:44 EDT Ventricular Rate:  108 PR Interval:  217 QRS Duration:  102 QT Interval:  289 QTC Calculation: 388 R Axis:   60  Text Interpretation: Sinus tachycardia Prolonged PR interval Probable LVH with secondary repol abnrm No acute changes No significant change since last tracing Confirmed by Deatra Face ([id number]) on 01/19/2024 3:32:07 PM  Radiology DG Chest Port 1 View Result Date: 01/19/2024 CLINICAL DATA:  [id number] Epigastric abdominal pain [id number] EXAM: PORTABLE CHEST - 1 VIEW COMPARISON:  06/16/2014 FINDINGS: Lungs are clear. Stable right IJ port catheter to the cavoatrial junction. Heart size and mediastinal contours are within normal limits. Surgical clips bilateral axillae. No effusion. Left shoulder DJD. IMPRESSION: No acute cardiopulmonary disease. Electronically Signed   By: Nicoletta Barrier M.D.   On: 01/19/2024 16:11    Procedures Procedures     Medications Ordered in ED Medications  pantoprazole (PROTONIX) injection 40 mg (40 mg Intravenous Given 01/20/24 1017)  lactated ringers  infusion ( Intravenous New Bag/Given 01/20/24 1535)  promethazine (PHENERGAN) 12.5 mg in sodium chloride  0.9 % 50 mL IVPB (12.5 mg Intravenous New Bag/Given 01/20/24 1158)    Followed by  promethazine (PHENERGAN) 12.5 mg in sodium chloride  0.9 % 50 mL IVPB (has no administration in time range)  oxyCODONE -acetaminophen  (PERCOCET/ROXICET) 5-325 MG per tablet 1-2 tablet (has no administration in time range)  carvedilol  (COREG ) tablet 18.75 mg (18.75 mg Oral Given 01/20/24 0842)  ALPRAZolam  (XANAX ) tablet 1 mg (1 mg Oral Given 01/20/24 0253)  temazepam  (RESTORIL ) capsule 30 mg (30 mg Oral Given 01/19/24 2230)  acetaminophen  (TYLENOL ) tablet 650 mg (has no administration in time range)    Or  acetaminophen  (TYLENOL ) suppository 650 mg (has no administration in time range)  amLODipine  (NORVASC ) tablet 10 mg (10 mg Oral Given 01/20/24 1345)  pantoprazole (PROTONIX) injection 80 mg (80 mg Intravenous Given 01/19/24 1624)  lactated ringers  bolus 1,000 mL (1,000 mLs Intravenous New Bag/Given 01/19/24 1814)  prochlorperazine  (COMPAZINE ) injection 5 mg (5 mg Intravenous Given 01/19/24 2213)  melatonin tablet 5 mg (5 mg Oral Given 01/20/24 0041)  potassium chloride  10 mEq in 100 mL IVPB (10 mEq Intravenous New Bag/Given 01/20/24 1016)  magnesium sulfate IVPB 4 g 100 mL (4 g Intravenous New Bag/Given 01/20/24 1202)  loperamide (IMODIUM) capsule 4 mg (4 mg Oral Given 01/20/24 1345)    ED Course/ Medical Decision Making/ A&P                                 Medical Decision Making Amount and/or Complexity of Data Reviewed Labs: ordered. Radiology:  ordered.  Risk Prescription drug management. Decision regarding hospitalization.   This patient presents to the ED with chief complaint(s) of coffee-ground emesis with pertinent past medical history of metastatic breast  cancer, PE on Eliquis  and CHF.The complaint involves an extensive differential diagnosis and also carries with it a high risk of complications and morbidity.    The differential diagnosis considered for this patient includes:  Esophagitis Mallory Weiss tear [place]  Variceal bleeding PUD/Gastritis/ulcers Diverticular bleed Colon cancer Rectal bleed Internal hemorrhoids External hemorrhoids  The initial plan is to get basic labs and try to control the nausea.  Type and screen sent, x-ray ordered to ensure there is no mediastinitis, free air. I have considered CT angiogram of the abdomen as well given reports of profound emesis, however we will reassess her in the ER and decide on CT scan based on lab results and reassessments.   Additional history obtained: Additional history obtained from family and spouse Records reviewed previous oncology notes, PCP notes and medications  Independent labs interpretation:  The following labs were independently interpreted: Patient's BUN is 12.  Hemoglobin is also at baseline for her at 10.8.  No renal failure noted.  Bicarb is slightly low at 18.  Independent visualization and interpretation of imaging: - I independently visualized the following imaging with scope of interpretation limited to determining acute life threatening conditions related to emergency care: X-ray of the chest, which revealed no free air.  Treatment and Reassessment: Patient reassessed.  She states that she feels better, but still nauseous and has had some episodes of coffee-ground colored emesis here.  I have seen the emesis in one of the bags, it is indeed coffee-ground in color.  Consultation: - Consulted or discussed management/test interpretation with external professional: Dr. Kimble Pennant, GI was notified about this visit.  Patient does not have any established GI in our system.  Patient has received Protonix, hemodynamically she is stable at this time, besides the  tachycardia.     Final Clinical Impression(s) / ED Diagnoses Final diagnoses:  Hematemesis with nausea    Rx / DC Orders ED Discharge Orders     None         Deatra Face, MD 01/20/24 1546

## 2024-01-21 ENCOUNTER — Encounter (HOSPITAL_COMMUNITY): Payer: Self-pay | Admitting: Internal Medicine

## 2024-01-21 ENCOUNTER — Inpatient Hospital Stay (HOSPITAL_COMMUNITY): Admitting: Anesthesiology

## 2024-01-21 ENCOUNTER — Encounter (HOSPITAL_COMMUNITY)
Admission: EM | Disposition: A | Discharge status: Home Health | Payer: Self-pay | Source: Home / Self Care | Attending: Internal Medicine

## 2024-01-21 ENCOUNTER — Other Ambulatory Visit: Payer: Self-pay | Admitting: *Deleted

## 2024-01-21 DIAGNOSIS — Z7901 Long term (current) use of anticoagulants: Secondary | ICD-10-CM | POA: Diagnosis not present

## 2024-01-21 DIAGNOSIS — K2970 Gastritis, unspecified, without bleeding: Secondary | ICD-10-CM

## 2024-01-21 DIAGNOSIS — I10 Essential (primary) hypertension: Secondary | ICD-10-CM | POA: Diagnosis not present

## 2024-01-21 DIAGNOSIS — Z66 Do not resuscitate: Secondary | ICD-10-CM | POA: Diagnosis not present

## 2024-01-21 DIAGNOSIS — K3189 Other diseases of stomach and duodenum: Secondary | ICD-10-CM

## 2024-01-21 DIAGNOSIS — E8721 Acute metabolic acidosis: Secondary | ICD-10-CM | POA: Diagnosis not present

## 2024-01-21 DIAGNOSIS — K920 Hematemesis: Secondary | ICD-10-CM

## 2024-01-21 DIAGNOSIS — E66813 Obesity, class 3: Secondary | ICD-10-CM

## 2024-01-21 DIAGNOSIS — C50919 Malignant neoplasm of unspecified site of unspecified female breast: Secondary | ICD-10-CM

## 2024-01-21 DIAGNOSIS — C50911 Malignant neoplasm of unspecified site of right female breast: Secondary | ICD-10-CM

## 2024-01-21 HISTORY — PX: ESOPHAGOGASTRODUODENOSCOPY: SHX5428

## 2024-01-21 LAB — BASIC METABOLIC PANEL WITH GFR
Anion gap: 12 (ref 5–15)
BUN: 5 mg/dL — ABNORMAL LOW (ref 8–23)
CO2: 25 mmol/L (ref 22–32)
Calcium: 7.9 mg/dL — ABNORMAL LOW (ref 8.9–10.3)
Chloride: 99 mmol/L (ref 98–111)
Creatinine, Ser: 1.23 mg/dL — ABNORMAL HIGH (ref 0.44–1.00)
GFR, Estimated: 47 mL/min — ABNORMAL LOW (ref >60)
Glucose, Bld: 106 mg/dL — ABNORMAL HIGH (ref 70–99)
Potassium: 2.9 mmol/L — ABNORMAL LOW (ref 3.5–5.1)
Sodium: 136 mmol/L (ref 135–145)

## 2024-01-21 LAB — CBC WITH DIFFERENTIAL/PLATELET
Abs Immature Granulocytes: 0.04 10*3/uL (ref 0.00–0.07)
Basophils Absolute: 0.1 10*3/uL (ref 0.0–0.1)
Basophils Relative: 1 %
Eosinophils Absolute: 0.2 10*3/uL (ref 0.0–0.5)
Eosinophils Relative: 2 %
HCT: 29 % — ABNORMAL LOW (ref 36.0–46.0)
Hemoglobin: 10 g/dL — ABNORMAL LOW (ref 12.0–15.0)
Immature Granulocytes: 1 %
Lymphocytes Relative: 26 %
Lymphs Abs: 2.2 10*3/uL (ref 0.7–4.0)
MCH: 27.4 pg (ref 26.0–34.0)
MCHC: 34.5 g/dL (ref 30.0–36.0)
MCV: 79.5 fL — ABNORMAL LOW (ref 80.0–100.0)
Monocytes Absolute: 0.8 10*3/uL (ref 0.1–1.0)
Monocytes Relative: 10 %
Neutro Abs: 5 10*3/uL (ref 1.7–7.7)
Neutrophils Relative %: 60 %
Platelets: 293 10*3/uL (ref 150–400)
RBC: 3.65 MIL/uL — ABNORMAL LOW (ref 3.87–5.11)
RDW: 14.2 % (ref 11.5–15.5)
WBC: 8.2 10*3/uL (ref 4.0–10.5)
nRBC: 0 % (ref 0.0–0.2)

## 2024-01-21 LAB — POCT I-STAT, CHEM 8
BUN: 6 mg/dL — ABNORMAL LOW (ref 8–23)
Calcium, Ion: 1.1 mmol/L — ABNORMAL LOW (ref 1.15–1.40)
Chloride: 99 mmol/L (ref 98–111)
Creatinine, Ser: 1.5 mg/dL — ABNORMAL HIGH (ref 0.44–1.00)
Glucose, Bld: 89 mg/dL (ref 70–99)
HCT: 29 % — ABNORMAL LOW (ref 36.0–46.0)
Hemoglobin: 9.9 g/dL — ABNORMAL LOW (ref 12.0–15.0)
Potassium: 4 mmol/L (ref 3.5–5.1)
Sodium: 136 mmol/L (ref 135–145)
TCO2: 28 mmol/L (ref 22–32)

## 2024-01-21 LAB — MAGNESIUM: Magnesium: 1.6 mg/dL — ABNORMAL LOW (ref 1.7–2.4)

## 2024-01-21 SURGERY — EGD (ESOPHAGOGASTRODUODENOSCOPY)
Anesthesia: Monitor Anesthesia Care | Laterality: Left

## 2024-01-21 MED ORDER — PANTOPRAZOLE SODIUM 40 MG PO TBEC: 40.0000 mg | DELAYED_RELEASE_TABLET | Freq: Every day | ORAL | 0 refills | Status: AC

## 2024-01-21 MED ORDER — PHENYLEPHRINE 80 MCG/ML (10ML) SYRINGE FOR IV PUSH (FOR BLOOD PRESSURE SUPPORT)
PREFILLED_SYRINGE | INTRAVENOUS | Status: DC | PRN
  Administered 2024-01-21: 80 ug via INTRAVENOUS
  Administered 2024-01-21: 160 ug via INTRAVENOUS
  Administered 2024-01-21: 80 ug via INTRAVENOUS

## 2024-01-21 MED ORDER — SODIUM CHLORIDE 0.9% FLUSH
10.0000 mL | Freq: Two times a day (BID) | INTRAVENOUS | Status: DC
  Administered 2024-01-21: 10 mL

## 2024-01-21 MED ORDER — SODIUM CHLORIDE 0.9% FLUSH: 10.0000 mL | INTRAVENOUS | Status: DC | PRN

## 2024-01-21 MED ORDER — HEPARIN SOD (PORK) LOCK FLUSH 100 UNIT/ML IV SOLN
500.0000 [IU] | INTRAVENOUS | Status: AC | PRN
  Administered 2024-01-21: 500 [IU]

## 2024-01-21 MED ORDER — PROPOFOL 10 MG/ML IV BOLUS
INTRAVENOUS | Status: DC | PRN
  Administered 2024-01-21: 20 mg via INTRAVENOUS
  Administered 2024-01-21: 30 mg via INTRAVENOUS

## 2024-01-21 MED ORDER — PROPOFOL 1000 MG/100ML IV EMUL
INTRAVENOUS | Status: AC
Start: 2024-01-21 — End: ?
  Filled 2024-01-21: qty 100

## 2024-01-21 MED ORDER — MAGNESIUM SULFATE 2 GM/50ML IV SOLN
2.0000 g | Freq: Once | INTRAVENOUS | Status: AC
  Administered 2024-01-21: 2 g via INTRAVENOUS
  Filled 2024-01-21: qty 50

## 2024-01-21 MED ORDER — SODIUM CHLORIDE 0.9 % IV SOLN
INTRAVENOUS | Status: DC | PRN
Start: 2024-01-21 — End: 2024-01-21

## 2024-01-21 MED ORDER — ONDANSETRON HCL 8 MG PO TABS: 8.0000 mg | ORAL_TABLET | Freq: Three times a day (TID) | ORAL | 0 refills | Status: AC | PRN

## 2024-01-21 MED ORDER — PROPOFOL 500 MG/50ML IV EMUL
INTRAVENOUS | Status: DC | PRN
  Administered 2024-01-21: 135 ug/kg/min via INTRAVENOUS

## 2024-01-21 MED ORDER — POTASSIUM CHLORIDE 10 MEQ/100ML IV SOLN
10.0000 meq | INTRAVENOUS | Status: AC
  Administered 2024-01-21 (×4): 10 meq via INTRAVENOUS
  Filled 2024-01-21 (×4): qty 100

## 2024-01-21 NOTE — Progress Notes (Signed)
 PROGRESS NOTE    [name]  YNW:[id number] DOB: 1950/10/24 DOA: 01/19/2024 PCP: Olan Bering, MD  Subjective: Pt seen and examined.  Pt seen and examined. EGD negative today for source of bleeding. Pt had been on IV protonix for over 48 hours prior to EGD. She may have healed any GI irritation that could have cause her bleeding. Pt cleared for discharge by GI.  Discussed with pt's family. Then want pt to have home PT and NA. Orders have been placed. CM aware of home health orders.   Hospital Course: HPI:  73 year old African-American female history of metastatic breast cancer, chronic diastolic heart failure, hypertension, history of PE on chronic anticoagulation who presents to the ER today with onset of hematemesis around 7 AM.  She states that her first bout of emesis did not show any blood.  Subsequent vomiting started showing coffee-ground material.  She has been having nausea and vomiting nearly all day.  She came to the ER for evaluation.  On arrival tachycardic heart rate 115 blood pressure 164/63 satting 97% on room air.  White count 11.9, hemoglobin 10.8, platelets of 355  INR 1.6  Sodium 139, potassium 3.2, chloride of 101, CO2 18, BUN of 12, creatinine 1.14, calcium  8.8, total protein 8.4, albumin 2.7, AST of 15, ALT of 8, alk phos 75  Chest x-ray shows no acute cardiopulmonary disease.  EDP has sent secure chat message to Dr. Kimble Pennant with GI requesting consultation for hematemesis.  Patient was given 80 mg of IV Protonix.  [facility] consulted for admission. Significant Events: Admitted 01/19/2024 for hematemesis   Admission Labs: WBC 11.9, HgB 10.8, plt 355 INR 1.6 Na 139, K 3.2, CO2 of 18, BUN 12, Scr 1.15, glu 107 Prot 8.4, alb 2.7, AST 15, ALT 8, alk phos 75, T. Bili 1.2  Admission Imaging Studies:   Significant Labs:   Significant Imaging Studies:   Antibiotic Therapy: Anti-infectives (From admission, onward)    None        Procedures:   Consultants:     Assessment and Plan: * Hematemesis 01-19-2024 hematemesis started suddenly this AM. GI consulted by EDP(Outlaw). Start protonix 40 mg IV q12h. Start IVF. NPO except meds/ice chips  01-20-2024 remains on BID IV protonix. awaiting GI consult.  Despite IVF overnight, her HgB has actually increased since yesterday!.  Yesterday, HgB 10.8.  after 1 L bolus of IVF and 100 ml/hr overnight, today's HgB up to 11 g/dl. No further hematemesis since last night.  BUN remains low at 7. Yesterday BUN was 12. I would have expect her BUN to rise with significant UGI bleeding.  The fact that her HgB has INCREASED even with IVF resuscitation means to me that her reported hematemesis is not significant. Combined with the fact that her BUN has not increased at all since last night, I seriously doubt she has any UGI pathology. Awaiting for GI consult.  Hopefully she doesn't need an EGD and I can feed her and discharge to home soon.  01-21-2024 resolved. EGD negative.  Acute metabolic acidosis 01-19-2024 will give LR bolus of 1L and start IVF @ 100 ml/hr. Check BMP in AM.  01-20-2024 improved with IVF. Bicarb up to 21. Was 18 yesterday. Remains on IVF due to NPO status.  01-21-2024 resolved. Stop IVF.  Hypomagnesemia 01-20-2024 replete with IV Magnesium  01-21-2024 pt given 2 grams IV mag this AM.  Hypokalemia 01-19-2024 replete with IV Kcl due to hematemesis. Repeat BMP in AM.  01-21-2024 pt given  30 meq IV kcl over 3 hours today,.  DNR (do not resuscitate).DNI(Do Not Intubate) 01-19-2024 verified with pt, pt's husband and pt's dtr that pt is a DNR/DNI. This is consistent with her prior wishes.  Chronic anticoagulation 01-19-2024 on Eliquis  due to prior hx of PE  01-20-2024 xarelto  still on hold.   01-21-2024 restart xarelto  at home.  History of pulmonary embolism 01-19-2024 at home pt on Xarelto . Holding DOAC during hematemesis evaluation. SCD for DVT  prophylaxis.  01-20-2024 xarelto  still on hold.  01-21-2024 restart xarelto  at home.  Obesity, Class III, BMI 40-49.9 (morbid obesity) Body mass index is 43.58 kg/m.   Breast cancer metastasized to multiple sites [facility]) 01-19-2024 pt followed by oncology. Gets chemo every 3-4 weeks. Husband states pt is getting ready to get chemo again next week.  01-21-2024 stable. Follows with oncology  Essential hypertension 01-19-2024 while NPO, will give IV lopressor . Hold lasix  and cozaar  given blood loss.  01-20-2024 continue coreg . No AKI. Will restart norvasc   01-21-2024 can restart her home HTN meds at home.  DVT prophylaxis: SCDs Start: 01/19/24 2011    Code Status: Limited: Do not attempt resuscitation (DNR) -DNR-LIMITED -Do Not Intubate/DNI  Family Communication: discussed with pt's family at bedside Disposition Plan: return home Reason for continuing need for hospitalization: medically stable for DC.  Objective: Vitals:   01/21/24 1135 01/21/24 1228 01/21/24 1230 01/21/24 1240  BP: 109/71 91/67 96/71  123/83  Pulse:  87 93 93  Resp: 14 (!) 34 20 (!) 29  Temp: (!) 97.1 F (36.2 C)     TempSrc: Temporal     SpO2: 94% 99% 98% 92%  Weight:      Height:        Intake/Output Summary (Last 24 hours) at 01/21/2024 1323 Last data filed at 01/21/2024 1229 Gross per 24 hour  Intake 2789.54 ml  Output 1150 ml  Net 1639.54 ml   Filed Weights   01/19/24 1521  Weight: 111.6 kg    Examination:  Physical Exam Vitals and nursing note reviewed.  Constitutional:      Appearance: She is obese.  HENT:     Head: Normocephalic and atraumatic.  Pulmonary:     Effort: Pulmonary effort is normal. No respiratory distress.  Skin:    Capillary Refill: Capillary refill takes less than 2 seconds.  Neurological:     Mental Status: She is oriented to person, place, and time.     Data Reviewed: I have personally reviewed following labs and imaging studies  CBC: Recent Labs  Lab  01/19/24 1529 01/20/24 0513 01/21/24 0210 01/21/24 1154  WBC 11.9* 11.1* 8.2  --   NEUTROABS 8.2* 6.6 5.0  --   HGB 10.8* 11.0* 10.0* 9.9*  HCT 31.1* 32.7* 29.0* 29.0*  MCV 80.2 80.7 79.5*  --   PLT 355 336 293  --    Basic Metabolic Panel: Recent Labs  Lab 01/19/24 1529 01/20/24 0513 01/21/24 0210 01/21/24 1154  NA 139 135 136 136  K 3.2* 3.1* 2.9* 4.0  CL 101 95* 99 99  CO2 18* 21* 25  --   GLUCOSE 107* 109* 106* 89  BUN 12 7* 5* 6*  CREATININE 1.14* 0.97 1.23* 1.50*  CALCIUM  8.8* 8.7* 7.9*  --   MG  --  1.1* 1.6*  --    GFR: Estimated Creatinine Clearance: 40.7 mL/min (A) (by C-G formula based on SCr of 1.5 mg/dL (H)). Liver Function Tests: Recent Labs  Lab 01/19/24 1529 01/20/24 0513  AST  15 14*  ALT 8 9  ALKPHOS 75 66  BILITOT 1.2 1.6*  PROT 8.4* 8.1  ALBUMIN 2.7* 2.7*   Coagulation Profile: Recent Labs  Lab 01/19/24 1529  INR 1.6*   BNP (last 3 results) Recent Labs    11/12/23 1428  BNP 64.6   Radiology Studies: DG Chest Port 1 View Result Date: 01/19/2024 CLINICAL DATA:  045409 Epigastric abdominal pain [id number] EXAM: PORTABLE CHEST - 1 VIEW COMPARISON:  06/16/2014 FINDINGS: Lungs are clear. Stable right IJ port catheter to the cavoatrial junction. Heart size and mediastinal contours are within normal limits. Surgical clips bilateral axillae. No effusion. Left shoulder DJD. IMPRESSION: No acute cardiopulmonary disease. Electronically Signed   By: Nicoletta Barrier M.D.   On: 01/19/2024 16:11    Scheduled Meds:  amLODipine   10 mg Oral q morning   carvedilol   18.75 mg Oral BID WC   pantoprazole (PROTONIX) IV  40 mg Intravenous Q12H   sodium chloride  flush  10-40 mL Intracatheter Q12H   temazepam   30 mg Oral QHS   Continuous Infusions:  promethazine (PHENERGAN) injection (IM or IVPB) Stopped (01/20/24 2006)     LOS: 1 day   Time spent: 50 minutes  Unk Garb, DO  [facility]  01/21/2024, 1:23 PM

## 2024-01-21 NOTE — Anesthesia Preprocedure Evaluation (Addendum)
 Anesthesia Evaluation  Patient identified by MRN, date of birth, ID band Patient awake    Reviewed: Allergy & Precautions, NPO status , Patient's Chart, lab work & pertinent test results, reviewed documented beta blocker date and time   History of Anesthesia Complications Negative for: history of anesthetic complications  Airway Mallampati: II  TM Distance: >3 FB Neck ROM: Full    Dental  (+) Dental Advisory Given, Chipped   Pulmonary PE   Pulmonary exam normal        Cardiovascular hypertension, Pt. on medications and Pt. on home beta blockers Normal cardiovascular exam   '25 TTE - EF 55 to 60%. There is mild left ventricular hypertrophy of the basal-septal segment. The right ventricular size is mildly enlarged. Trivial mitral valve regurgitation.     Neuro/Psych  PSYCHIATRIC DISORDERS Anxiety      Neuromuscular disease    GI/Hepatic Neg liver ROS,GERD  Controlled,,  Endo/Other    Class 3 obesity  Renal/GU negative Renal ROS     Musculoskeletal negative musculoskeletal ROS (+)    Abdominal   Peds  Hematology  (+) Blood dyscrasia, anemia  On eliquis     Anesthesia Other Findings   Reproductive/Obstetrics  Breast cancer with liver/lung mets                               Anesthesia Physical Anesthesia Plan  ASA: 3  Anesthesia Plan: MAC   Post-op Pain Management: Minimal or no pain anticipated   Induction:   PONV Risk Score and Plan: 2 and Propofol  infusion and Treatment may vary due to age or medical condition  Airway Management Planned: Nasal Cannula and Natural Airway  Additional Equipment: None  Intra-op Plan:   Post-operative Plan:   Informed Consent: I have reviewed the patients History and Physical, chart, labs and discussed the procedure including the risks, benefits and alternatives for the proposed anesthesia with the patient or authorized representative who has  indicated his/her understanding and acceptance.   Patient has DNR.  Discussed DNR with patient and Suspend DNR.     Plan Discussed with: CRNA and Anesthesiologist  Anesthesia Plan Comments:        Anesthesia Quick Evaluation

## 2024-01-21 NOTE — Anesthesia Procedure Notes (Signed)
 Procedure Name: MAC Date/Time: 01/21/2024 12:10 PM  Performed by: Ulanda Gambles, CRNAPre-anesthesia Checklist: Patient identified, Emergency Drugs available, Suction available and Patient being monitored Oxygen Delivery Method: Simple face mask

## 2024-01-21 NOTE — Discharge Summary (Signed)
 [facility] Physician Discharge Summary   Patient name: [name]  Admit date:     01/19/2024  Discharge date: 01/21/2024  Attending Physician: Farrel Hones Lotta Frankenfield [[id number]]  Discharge Physician: Unk Garb   PCP: Olan Bering, MD  Admitted From: Home  Disposition:  Home  Recommendations for Outpatient Follow-up:  Follow up with PCP in 1-2 weeks Follow up with oncology as scheduled. Please follow up on the following pending results:  Home Health:Yes. PT and nurse aide Equipment/Devices: None  Discharge Condition:Stable CODE STATUS:DNR/DNI Diet recommendation: Heart Healthy Fluid Restriction: None  Hospital Summary: HPI:  73 year old African-American female history of metastatic breast cancer, chronic diastolic heart failure, hypertension, history of PE on chronic anticoagulation who presents to the ER today with onset of hematemesis around 7 AM.  She states that her first bout of emesis did not show any blood.  Subsequent vomiting started showing coffee-ground material.  She has been having nausea and vomiting nearly all day.  She came to the ER for evaluation.  On arrival tachycardic heart rate 115 blood pressure 164/63 satting 97% on room air.  White count 11.9, hemoglobin 10.8, platelets of 355  INR 1.6  Sodium 139, potassium 3.2, chloride of 101, CO2 18, BUN of 12, creatinine 1.14, calcium  8.8, total protein 8.4, albumin 2.7, AST of 15, ALT of 8, alk phos 75  Chest x-ray shows no acute cardiopulmonary disease.  EDP has sent secure chat message to Dr. Kimble Pennant with GI requesting consultation for hematemesis.  Patient was given 80 mg of IV Protonix.  [facility] consulted for admission. Significant Events: Admitted 01/19/2024 for hematemesis   Admission Labs: WBC 11.9, HgB 10.8, plt 355 INR 1.6 Na 139, K 3.2, CO2 of 18, BUN 12, Scr 1.15, glu 107 Prot 8.4, alb 2.7, AST 15, ALT 8, alk phos 75, T. Bili 1.2  Admission Imaging Studies:   Significant  Labs:   Significant Imaging Studies:   Antibiotic Therapy: Anti-infectives (From admission, onward)    None       Procedures: 01-21-2024 EGD negative  Consultants: GI   Hospital Course by Problem: * Hematemesis 01-19-2024 hematemesis started suddenly this AM. GI consulted by EDP(Outlaw). Start protonix 40 mg IV q12h. Start IVF. NPO except meds/ice chips  01-20-2024 remains on BID IV protonix. awaiting GI consult.  Despite IVF overnight, her HgB has actually increased since yesterday!.  Yesterday, HgB 10.8.  after 1 L bolus of IVF and 100 ml/hr overnight, today's HgB up to 11 g/dl. No further hematemesis since last night.  BUN remains low at 7. Yesterday BUN was 12. I would have expect her BUN to rise with significant UGI bleeding.  The fact that her HgB has INCREASED even with IVF resuscitation means to me that her reported hematemesis is not significant. Combined with the fact that her BUN has not increased at all since last night, I seriously doubt she has any UGI pathology. Awaiting for GI consult.  Hopefully she doesn't need an EGD and I can feed her and discharge to home soon.  01-21-2024 resolved. EGD negative.  Acute metabolic acidosis 01-19-2024 will give LR bolus of 1L and start IVF @ 100 ml/hr. Check BMP in AM.  01-20-2024 improved with IVF. Bicarb up to 21. Was 18 yesterday. Remains on IVF due to NPO status.  01-21-2024 resolved. Stop IVF.  Hypomagnesemia 01-20-2024 replete with IV Magnesium  01-21-2024 pt given 2 grams IV mag this AM.  Hypokalemia 01-19-2024 replete with IV Kcl due to hematemesis. Repeat BMP  in AM.  01-21-2024 pt given 30 meq IV kcl over 3 hours today,.  DNR (do not resuscitate).DNI(Do Not Intubate) 01-19-2024 verified with pt, pt's husband and pt's dtr that pt is a DNR/DNI. This is consistent with her prior wishes.  Chronic anticoagulation 01-19-2024 on Eliquis  due to prior hx of PE  01-20-2024 xarelto  still on hold.   01-21-2024  restart xarelto  at home.  History of pulmonary embolism 01-19-2024 at home pt on Xarelto . Holding DOAC during hematemesis evaluation. SCD for DVT prophylaxis.  01-20-2024 xarelto  still on hold.  01-21-2024 restart xarelto  at home.  Obesity, Class III, BMI 40-49.9 (morbid obesity) Body mass index is 43.58 kg/m.   Breast cancer metastasized to multiple sites [facility]) 01-19-2024 pt followed by oncology. Gets chemo every 3-4 weeks. Husband states pt is getting ready to get chemo again next week.  01-21-2024 stable. Follows with oncology  Essential hypertension 01-19-2024 while NPO, will give IV lopressor . Hold lasix  and cozaar  given blood loss.  01-20-2024 continue coreg . No AKI. Will restart norvasc   01-21-2024 can restart her home HTN meds at home.    Discharge Diagnoses:  Principal Problem:   Hematemesis Active Problems:   Acute metabolic acidosis   Essential hypertension   Breast cancer metastasized to multiple sites (HCC)   Obesity, Class III, BMI 40-49.9 (morbid obesity)   History of pulmonary embolism   Chronic anticoagulation   DNR (do not resuscitate).DNI(Do Not Intubate)   Hypokalemia   Hypomagnesemia   Discharge Instructions  Discharge Instructions     Call MD for:  difficulty breathing, headache or visual disturbances   Complete by: As directed    Call MD for:  extreme fatigue   Complete by: As directed    Call MD for:  hives   Complete by: As directed    Call MD for:  persistant dizziness or light-headedness   Complete by: As directed    Call MD for:  persistant nausea and vomiting   Complete by: As directed    Call MD for:  redness, tenderness, or signs of infection (pain, swelling, redness, odor or green/yellow discharge around incision site)   Complete by: As directed    Call MD for:  severe uncontrolled pain   Complete by: As directed    Call MD for:  temperature >100.4   Complete by: As directed    Diet - low sodium heart healthy   Complete  by: As directed    Discharge instructions   Complete by: As directed    1. Follow up with your primary care provider in 1-2 weeks following discharge from hospital. 2. Follow up with oncology as scheduled.   Increase activity slowly   Complete by: As directed       Allergies as of 01/21/2024       Reactions   Adhesive [tape] Other (See Comments)   Tears the skin   Penicillins Hives, Other (See Comments)   PATIENT HAS TOLERATED Cephalosporins        Medication List     STOP taking these medications    morphine  15 MG 12 hr tablet Commonly known as: MS CONTIN        TAKE these medications    acetaminophen  500 MG tablet Commonly known as: TYLENOL  Take 500-1,000 mg by mouth every 6 (six) hours as needed for mild pain (pain score 1-3), fever or moderate pain (pain score 4-6).   albuterol  108 (90 Base) MCG/ACT inhaler Commonly known as: VENTOLIN  HFA Inhale 2 puffs into the lungs  every 6 (six) hours as needed for wheezing.   ALPRAZolam  1 MG tablet Commonly known as: XANAX  Take 1 tablet (1 mg total) by mouth 3 (three) times daily as needed for anxiety.   amLODipine  10 MG tablet Commonly known as: NORVASC  Take 1 tablet (10 mg total) by mouth every morning.   apixaban  5 MG Tabs tablet Commonly known as: Eliquis  Take 1 tablet (5 mg total) by mouth 2 (two) times daily.   baclofen  10 MG tablet Commonly known as: LIORESAL  TAKE 1 TABLET BY MOUTH THREE TIMES DAILY AS NEEDED FOR MUSCLE SPASMS What changed:  when to take this reasons to take this additional instructions   CALCIUM  PO Take 2 tablets by mouth daily.   carvedilol  12.5 MG tablet Commonly known as: COREG  Take 1.5 tablets (18.75 mg total) by mouth 2 (two) times daily with a meal.   cyclobenzaprine  5 MG tablet Commonly known as: FLEXERIL  Take 1 tablet (5 mg total) by mouth 2 (two) times daily as needed for muscle spasms.   DULoxetine 60 MG capsule Commonly known as: CYMBALTA Take 60 mg by mouth daily.    furosemide  40 MG tablet Commonly known as: LASIX  Take 40 mg by mouth as needed for fluid or edema.   gabapentin  300 MG capsule Commonly known as: NEURONTIN  TAKE 2 CAPSULES BY MOUTH THREE TIMES DAILY   lidocaine  5 % Commonly known as: Lidoderm  Place 1 patch onto the skin daily. Remove & Discard patch within 12 hours or as directed by MD   losartan  100 MG tablet Commonly known as: COZAAR  Take 100 mg by mouth every morning.   MAGNESIUM PO Take 2 tablets by mouth daily.   ondansetron  8 MG tablet Commonly known as: ZOFRAN  Take 1 tablet (8 mg total) by mouth every 8 (eight) hours as needed for nausea or vomiting.   oxyCODONE -acetaminophen  5-325 MG tablet Commonly known as: PERCOCET/ROXICET Take 1-2 tablets by mouth every 6 (six) hours as needed for severe pain (pain score 7-10). What changed:  how much to take when to take this reasons to take this   pantoprazole 40 MG tablet Commonly known as: Protonix Take 1 tablet (40 mg total) by mouth daily.   POTASSIUM PO Take 1 tablet by mouth daily.   predniSONE 5 MG tablet Commonly known as: DELTASONE Take 5 mg by mouth daily as needed (inflammation).   rosuvastatin  10 MG tablet Commonly known as: Crestor  Take 1 tablet (10 mg total) by mouth daily.   spironolactone  25 MG tablet Commonly known as: ALDACTONE  Take 1 tablet daily   temazepam  30 MG capsule Commonly known as: RESTORIL  Take 1 capsule (30 mg total) by mouth at bedtime.        Allergies  Allergen Reactions   Adhesive [Tape] Other (See Comments)    Tears the skin    Penicillins Hives and Other (See Comments)    PATIENT HAS TOLERATED Cephalosporins    Discharge Exam: Vitals:   01/21/24 1230 01/21/24 1240  BP: 96/71 123/83  Pulse: 93 93  Resp: 20 (!) 29  Temp:    SpO2: 98% 92%    Physical Exam Vitals and nursing note reviewed.  Constitutional:      Appearance: She is obese.  HENT:     Head: Normocephalic and atraumatic.  Eyes:     General:  No scleral icterus. Pulmonary:     Effort: Pulmonary effort is normal. No respiratory distress.  Skin:    Capillary Refill: Capillary refill takes less than 2 seconds.  Neurological:     Mental Status: She is oriented to person, place, and time.     The results of significant diagnostics from this hospitalization (including imaging, microbiology, ancillary and laboratory) are listed below for reference.    Labs: BNP (last 3 results) Recent Labs    11/12/23 1428  BNP 64.6   Basic Metabolic Panel: Recent Labs  Lab 01/19/24 1529 01/20/24 0513 01/21/24 0210 01/21/24 1154  NA 139 135 136 136  K 3.2* 3.1* 2.9* 4.0  CL 101 95* 99 99  CO2 18* 21* 25  --   GLUCOSE 107* 109* 106* 89  BUN 12 7* 5* 6*  CREATININE 1.14* 0.97 1.23* 1.50*  CALCIUM  8.8* 8.7* 7.9*  --   MG  --  1.1* 1.6*  --    Liver Function Tests: Recent Labs  Lab 01/19/24 1529 01/20/24 0513  AST 15 14*  ALT 8 9  ALKPHOS 75 66  BILITOT 1.2 1.6*  PROT 8.4* 8.1  ALBUMIN 2.7* 2.7*   CBC: Recent Labs  Lab 01/19/24 1529 01/20/24 0513 01/21/24 0210 01/21/24 1154  WBC 11.9* 11.1* 8.2  --   NEUTROABS 8.2* 6.6 5.0  --   HGB 10.8* 11.0* 10.0* 9.9*  HCT 31.1* 32.7* 29.0* 29.0*  MCV 80.2 80.7 79.5*  --   PLT 355 336 293  --    Sepsis Labs Recent Labs  Lab 01/19/24 1529 01/20/24 0513 01/21/24 0210  WBC 11.9* 11.1* 8.2   Procedures/Studies: DG Chest Port 1 View Result Date: 01/19/2024 CLINICAL DATA:  [id number] Epigastric abdominal pain [id number] EXAM: PORTABLE CHEST - 1 VIEW COMPARISON:  06/16/2014 FINDINGS: Lungs are clear. Stable right IJ port catheter to the cavoatrial junction. Heart size and mediastinal contours are within normal limits. Surgical clips bilateral axillae. No effusion. Left shoulder DJD. IMPRESSION: No acute cardiopulmonary disease. Electronically Signed   By: Nicoletta Barrier M.D.   On: 01/19/2024 16:11   CT Hip Left Wo Contrast Result Date: 01/17/2024 CLINICAL DATA:  Hip pain, stress fracture  suspected, negative x-ray EXAM: CT OF THE LEFT HIP WITHOUT CONTRAST TECHNIQUE: Multidetector CT imaging of the left hip was performed according to the standard protocol. Multiplanar CT image reconstructions were also generated. RADIATION DOSE REDUCTION: This exam was performed according to the departmental dose-optimization program which includes automated exposure control, adjustment of the mA and/or kV according to patient size and/or use of iterative reconstruction technique. COMPARISON:  Radiograph 12/26/2023 and CT 11/14/2023 FINDINGS: Bones/Joint/Cartilage No acute fracture or dislocation of the left hip. Loss of cortical distinction and erosive change about the left SI joint extending into the left sacral ala and left iliac wing. This is new compared to 11/14/2023. There is associated thickening of the left iliacus muscle. No discrete fluid collection or abscess though evaluation is limited without IV contrast. Ligaments Suboptimally assessed by CT. Muscles and Tendons Thickening of the left iliacus muscle adjacent to the erosive change in the left SI joint without defined fluid collection. Soft tissues Unremarkable. IMPRESSION: Loss of cortical definition and erosions about the left SI joint. Associated thickening/edema of the left iliacus muscle. These findings are new compared to 11/14/2023. Primary differential considerations are septic sacroiliitis with myositis of the iliacus muscle or infiltrative mass/metastasis in the left SI joint and hematoma in the left iliacus. Electronically Signed   By: Rozell Cornet M.D.   On: 01/17/2024 22:22   DG HIP UNILAT WITH PELVIS 2-3 VIEWS LEFT Result Date: 12/26/2023 CLINICAL DATA:  Low back pain and left hip  pain. EXAM: DG HIP (WITH OR WITHOUT PELVIS) 2-3V LEFT COMPARISON:  None Available. FINDINGS: Pelvis is intact with normal and symmetric sacroiliac joints. No acute fracture or dislocation. No aggressive osseous lesion. Visualized sacral arcuate lines are  unremarkable. There are changes of chronic pubic symphisitis. There are moderate degenerative changes of bilateral hip joints characterized by joint space narrowing and osteophytosis of the superior acetabulum. No radiopaque foreign bodies. There is dystrophic calcification overlying the right side of the pelvis, which corresponds to calcified leiomyomas on the prior CT scan abdomen and pelvis. IMPRESSION: *No acute osseous abnormality of the pelvis or left hip joint. Electronically Signed   By: Beula Brunswick M.D.   On: 12/26/2023 12:02   DG Lumbar Spine Complete Result Date: 12/26/2023 CLINICAL DATA:  Low back pain. EXAM: LUMBAR SPINE - COMPLETE 4+ VIEW COMPARISON:  None Available. FINDINGS: There are 5 nonrib-bearing lumbar vertebrae. Anatomic lumbar curvature. There is grade 1/2 anterolisthesis of L4 over L5. Vertebral body heights are maintained. No aggressive osseous lesion. Moderate multilevel degenerative changes in the form of reduced intervertebral disc height, facet arthropathy and marginal osteophyte formation. Sacroiliac joints are symmetric. Visualized soft tissues are within normal limits. IMPRESSION: *No acute osseous abnormality of the lumbar spine. Moderate multilevel degenerative changes. Electronically Signed   By: Beula Brunswick M.D.   On: 12/26/2023 12:01    Time coordinating discharge: 55 mins  SIGNED:  Unk Garb, DO [facility] 01/21/24, 1:28 PM

## 2024-01-21 NOTE — Anesthesia Postprocedure Evaluation (Signed)
 Anesthesia Post Note  Patient: [name]  Procedure(s) Performed: EGD (ESOPHAGOGASTRODUODENOSCOPY) (Left)     Patient location during evaluation: PACU Anesthesia Type: MAC Level of consciousness: awake and alert Pain management: pain level controlled Vital Signs Assessment: post-procedure vital signs reviewed and stable Respiratory status: spontaneous breathing, nonlabored ventilation and respiratory function stable Cardiovascular status: stable and blood pressure returned to baseline Anesthetic complications: no  No notable events documented.  Last Vitals:  Vitals:   01/21/24 1230 01/21/24 1240  BP: 96/71 123/83  Pulse: 93 93  Resp: 20 (!) 29  Temp:    SpO2: 98% 92%    Last Pain:  Vitals:   01/21/24 1240  TempSrc:   PainSc: 6                  Juventino Oppenheim

## 2024-01-21 NOTE — Transfer of Care (Signed)
 Immediate Anesthesia Transfer of Care Note  Patient: [name]  Procedure(s) Performed: EGD (ESOPHAGOGASTRODUODENOSCOPY) (Left)  Patient Location: PACU  Anesthesia Type:MAC  Level of Consciousness: drowsy  Airway & Oxygen Therapy: Patient Spontanous Breathing and Patient connected to face mask oxygen  Post-op Assessment: Report given to RN, Post -op Vital signs reviewed and stable, and Patient moving all extremities X 4  Post vital signs: Reviewed and stable  Last Vitals:  Vitals Value Taken Time  BP 91/67   Temp    Pulse 94   Resp 34 01/21/24 1228  SpO2 99   Vitals shown include unfiled device data.  Last Pain:  Vitals:   01/21/24 1135  TempSrc: Temporal  PainSc: 0-No pain         Complications: No notable events documented.

## 2024-01-21 NOTE — Plan of Care (Signed)
 VSS. Patient given PRN Percocet for pain and PRN Phenergan for nausea. LBM 5/26. NPO since midnight for EGD. No acute events overnight.  Problem: Education: Goal: Knowledge of General Education information will improve Description: Including pain rating scale, medication(s)/side effects and non-pharmacologic comfort measures Outcome: Progressing   Problem: Health Behavior/Discharge Planning: Goal: Ability to manage health-related needs will improve Outcome: Progressing   Problem: Clinical Measurements: Goal: Ability to maintain clinical measurements within normal limits will improve Outcome: Progressing Goal: Will remain free from infection Outcome: Progressing   Problem: Activity: Goal: Risk for activity intolerance will decrease Outcome: Progressing   Problem: Pain Managment: Goal: General experience of comfort will improve and/or be controlled Outcome: Progressing   Problem: Safety: Goal: Ability to remain free from injury will improve Outcome: Progressing

## 2024-01-21 NOTE — Op Note (Signed)
 [facility] Patient Name: [name] Procedure Date: 01/21/2024 MRN: [id number] Attending MD: Evangeline Hilts , MD, [id number] Date of Birth: 02/13/1951 CSN: [id number] Age: 73 Admit Type: Inpatient Procedure:                Upper GI endoscopy Indications:              Hematemesis, Nausea with vomiting Providers:                Evangeline Hilts, MD, Lorenzo Romberg, RN, Tyrus Gallus, Technician Referring MD:             [facility] Medicines:                Monitored Anesthesia Care Complications:            No immediate complications. Estimated Blood Loss:     Estimated blood loss: none. Procedure:                Pre-Anesthesia Assessment:                           - Prior to the procedure, a History and Physical                            was performed, and patient medications and                            allergies were reviewed. The patient's tolerance of                            previous anesthesia was also reviewed. The risks                            and benefits of the procedure and the sedation                            options and risks were discussed with the patient.                            All questions were answered, and informed consent                            was obtained. Prior Anticoagulants: The patient has                            taken no anticoagulant or antiplatelet agents. ASA                            Grade Assessment: III - A patient with severe                            systemic disease. After reviewing the risks and  benefits, the patient was deemed in satisfactory                            condition to undergo the procedure.                           After obtaining informed consent, the endoscope was                            passed under direct vision. Throughout the                            procedure, the patient's blood pressure, pulse, and                             oxygen saturations were monitored continuously. The                            [id number]-[id number] ([id number]) Olympus endoscope was introduced                            through the mouth, and advanced to the second part                            of duodenum. The upper GI endoscopy was                            accomplished without difficulty. The patient                            tolerated the procedure well. Scope In: Scope Out: Findings:      The examined esophagus was normal.      Diffuse mildly congested mucosa was found in the prepyloric region of       the stomach.      The exam of the stomach was otherwise normal.      The duodenal bulb, first portion of the duodenum and second portion of       the duodenum were normal.      No old or fresh blood was seen to the extent of our examination. Impression:               - Normal esophagus.                           - Congestive gastropathy.                           - Normal duodenal bulb, first portion of the                            duodenum and second portion of the duodenum.                           - No specimens collected. Moderate Sedation:      None Recommendation:           -  Return patient to hospital ward for ongoing care.                           - Continue present medications.                           [facility] will sign-off; please call back with any                            questions; thank you for the consultation. Procedure Code(s):        --- Professional ---                           [phone], Esophagogastroduodenoscopy, flexible,                            transoral; diagnostic, including collection of                            specimen(s) by brushing or washing, when performed                            (separate procedure) Diagnosis Code(s):        --- Professional ---                           K31.89, Other diseases of stomach and duodenum                           K92.0, Hematemesis                            R11.2, Nausea with vomiting, unspecified CPT copyright 2022 [employer]. All rights reserved. The codes documented in this report are preliminary and upon coder review may  be revised to meet current compliance requirements. Evangeline Hilts, MD 01/21/2024 12:48:19 PM This report has been signed electronically. Number of Addenda: 0

## 2024-01-21 NOTE — Interval H&P Note (Signed)
 History and Physical Interval Note:  01/21/2024 12:07 PM  Keymora D Lauricella  has presented today for surgery, with the diagnosis of nausea, vomiting, coffee ground emesis.  The various methods of treatment have been discussed with the patient and family. After consideration of risks, benefits and other options for treatment, the patient has consented to  Procedure(s): EGD (ESOPHAGOGASTRODUODENOSCOPY) (Left) as a surgical intervention.  The patient's history has been reviewed, patient examined, no change in status, stable for surgery.  I have reviewed the patient's chart and labs.  Questions were answered to the patient's satisfaction.     Yves Herb

## 2024-01-21 NOTE — Plan of Care (Signed)
   Problem: Education: Goal: Knowledge of General Education information will improve Description Including pain rating scale, medication(s)/side effects and non-pharmacologic comfort measures Outcome: Progressing   Problem: Activity: Goal: Risk for activity intolerance will decrease Outcome: Progressing   Problem: Safety: Goal: Ability to remain free from injury will improve Outcome: Progressing

## 2024-01-21 NOTE — TOC Transition Note (Signed)
 Transition of Care [facility]) - Discharge Note   Patient Details  Name: [name] MRN: [id number] Date of Birth: [birth year]  Transition of Care [facility]) CM/SW Contact:  Tessie Fila, RN Phone Number: 01/21/2024, 1:49 PM   Clinical Narrative:    Pt is discharging home with home health services in place. Met with pt at bedside and she and her husband are in agreement with [facility] services being set up. Spoke with Bartholomew Light at [facility] and she has accepted the pt for [facility] PT/HH Aide. Pt will be transported home by son/daughter at time of discharge. HH orders are in place. There are no other TOC needs at this time.   Final next level of care: Home w Home Health Services Barriers to Discharge: No Barriers Identified   Patient Goals and CMS Choice Patient states their goals for this hospitalization and ongoing recovery are:: To return home CMS Medicare.gov Compare Post Acute Care list provided to:: Patient Choice offered to / list presented to : Patient [place] ownership interest in [facility].provided to:: Patient    Discharge Placement                       Discharge Plan and Services Additional resources added to the After Visit Summary for                  DME Arranged: N/A DME Agency: NA       HH Arranged: PT, Nurse's Aide HH Agency: [facility] Date [facility] Agency Contacted: 01/21/24 Time HH Agency Contacted: 1349 Representative spoke with at [facility] Agency: Johnson Nanny at [phone]  Social Drivers of Health (SDOH) Interventions SDOH Screenings   Food Insecurity: No Food Insecurity (01/20/2024)  Housing: Low Risk  (01/20/2024)  Transportation Needs: No Transportation Needs (01/20/2024)  Utilities: Not At Risk (01/20/2024)  Social Connections: Socially Integrated (01/20/2024)  Tobacco Use: Low Risk  (01/21/2024)     Readmission Risk Interventions    01/21/2024    1:47 PM  Readmission Risk Prevention Plan  Transportation Screening Complete   PCP or Specialist Appt within 3-5 Days Complete  HRI or Home Care Consult Complete  Social Work Consult for Recovery Care Planning/Counseling Complete  Palliative Care Screening Not Applicable  Medication Review [name]) Complete

## 2024-01-22 ENCOUNTER — Ambulatory Visit: Admitting: Hematology and Oncology

## 2024-01-22 ENCOUNTER — Ambulatory Visit

## 2024-01-23 ENCOUNTER — Encounter (HOSPITAL_COMMUNITY): Payer: Self-pay | Admitting: Gastroenterology

## 2024-01-23 DIAGNOSIS — M4316 Spondylolisthesis, lumbar region: Secondary | ICD-10-CM | POA: Diagnosis not present

## 2024-01-23 DIAGNOSIS — M47816 Spondylosis without myelopathy or radiculopathy, lumbar region: Secondary | ICD-10-CM | POA: Diagnosis not present

## 2024-01-23 DIAGNOSIS — M5459 Other low back pain: Secondary | ICD-10-CM | POA: Diagnosis not present

## 2024-01-24 ENCOUNTER — Other Ambulatory Visit (HOSPITAL_COMMUNITY): Payer: Self-pay

## 2024-01-24 DIAGNOSIS — I5032 Chronic diastolic (congestive) heart failure: Secondary | ICD-10-CM

## 2024-01-24 MED ORDER — SPIRONOLACTONE 25 MG PO TABS: ORAL_TABLET | ORAL | 3 refills | Status: DC

## 2024-01-27 ENCOUNTER — Inpatient Hospital Stay

## 2024-01-27 DIAGNOSIS — M545 Low back pain, unspecified: Secondary | ICD-10-CM | POA: Diagnosis not present

## 2024-01-27 DIAGNOSIS — R112 Nausea with vomiting, unspecified: Secondary | ICD-10-CM | POA: Diagnosis not present

## 2024-01-28 ENCOUNTER — Inpatient Hospital Stay (HOSPITAL_BASED_OUTPATIENT_CLINIC_OR_DEPARTMENT_OTHER): Admitting: Adult Health

## 2024-01-28 ENCOUNTER — Other Ambulatory Visit

## 2024-01-28 ENCOUNTER — Inpatient Hospital Stay: Attending: Oncology

## 2024-01-28 ENCOUNTER — Encounter: Payer: Self-pay | Admitting: Adult Health

## 2024-01-28 ENCOUNTER — Telehealth: Payer: Self-pay | Admitting: *Deleted

## 2024-01-28 VITALS — BP 103/68 | HR 83 | Temp 97.7°F | Resp 19 | Wt 222.4 lb

## 2024-01-28 DIAGNOSIS — Z7901 Long term (current) use of anticoagulants: Secondary | ICD-10-CM | POA: Diagnosis not present

## 2024-01-28 DIAGNOSIS — R9389 Abnormal findings on diagnostic imaging of other specified body structures: Secondary | ICD-10-CM | POA: Diagnosis not present

## 2024-01-28 DIAGNOSIS — C50012 Malignant neoplasm of nipple and areola, left female breast: Secondary | ICD-10-CM

## 2024-01-28 DIAGNOSIS — C50812 Malignant neoplasm of overlapping sites of left female breast: Secondary | ICD-10-CM | POA: Diagnosis not present

## 2024-01-28 DIAGNOSIS — Z5112 Encounter for antineoplastic immunotherapy: Secondary | ICD-10-CM | POA: Diagnosis not present

## 2024-01-28 DIAGNOSIS — C50011 Malignant neoplasm of nipple and areola, right female breast: Secondary | ICD-10-CM

## 2024-01-28 DIAGNOSIS — M25552 Pain in left hip: Secondary | ICD-10-CM

## 2024-01-28 DIAGNOSIS — Z171 Estrogen receptor negative status [ER-]: Secondary | ICD-10-CM

## 2024-01-28 DIAGNOSIS — C50919 Malignant neoplasm of unspecified site of unspecified female breast: Secondary | ICD-10-CM

## 2024-01-28 DIAGNOSIS — R634 Abnormal weight loss: Secondary | ICD-10-CM | POA: Diagnosis not present

## 2024-01-28 DIAGNOSIS — C773 Secondary and unspecified malignant neoplasm of axilla and upper limb lymph nodes: Secondary | ICD-10-CM | POA: Diagnosis not present

## 2024-01-28 DIAGNOSIS — E860 Dehydration: Secondary | ICD-10-CM | POA: Insufficient documentation

## 2024-01-28 DIAGNOSIS — E876 Hypokalemia: Secondary | ICD-10-CM | POA: Diagnosis not present

## 2024-01-28 DIAGNOSIS — C50911 Malignant neoplasm of unspecified site of right female breast: Secondary | ICD-10-CM | POA: Diagnosis not present

## 2024-01-28 DIAGNOSIS — K921 Melena: Secondary | ICD-10-CM | POA: Insufficient documentation

## 2024-01-28 DIAGNOSIS — Z79899 Other long term (current) drug therapy: Secondary | ICD-10-CM | POA: Insufficient documentation

## 2024-01-28 LAB — CBC WITH DIFFERENTIAL (CANCER CENTER ONLY)
Abs Immature Granulocytes: 0.07 10*3/uL (ref 0.00–0.07)
Basophils Absolute: 0 10*3/uL (ref 0.0–0.1)
Basophils Relative: 0 %
Eosinophils Absolute: 0.2 10*3/uL (ref 0.0–0.5)
Eosinophils Relative: 2 %
HCT: 28.6 % — ABNORMAL LOW (ref 36.0–46.0)
Hemoglobin: 10.1 g/dL — ABNORMAL LOW (ref 12.0–15.0)
Immature Granulocytes: 1 %
Lymphocytes Relative: 23 %
Lymphs Abs: 2.9 10*3/uL (ref 0.7–4.0)
MCH: 26.9 pg (ref 26.0–34.0)
MCHC: 35.3 g/dL (ref 30.0–36.0)
MCV: 76.3 fL — ABNORMAL LOW (ref 80.0–100.0)
Monocytes Absolute: 1.6 10*3/uL — ABNORMAL HIGH (ref 0.1–1.0)
Monocytes Relative: 13 %
Neutro Abs: 7.6 10*3/uL (ref 1.7–7.7)
Neutrophils Relative %: 61 %
Platelet Count: 261 10*3/uL (ref 150–400)
RBC: 3.75 MIL/uL — ABNORMAL LOW (ref 3.87–5.11)
RDW: 14.4 % (ref 11.5–15.5)
WBC Count: 12.4 10*3/uL — ABNORMAL HIGH (ref 4.0–10.5)
nRBC: 0 % (ref 0.0–0.2)

## 2024-01-28 LAB — CMP (CANCER CENTER ONLY)
ALT: 5 U/L (ref 0–44)
AST: 11 U/L — ABNORMAL LOW (ref 15–41)
Albumin: 3.1 g/dL — ABNORMAL LOW (ref 3.5–5.0)
Alkaline Phosphatase: 77 U/L (ref 38–126)
Anion gap: 11 (ref 5–15)
BUN: 22 mg/dL (ref 8–23)
CO2: 26 mmol/L (ref 22–32)
Calcium: 8.5 mg/dL — ABNORMAL LOW (ref 8.9–10.3)
Chloride: 96 mmol/L — ABNORMAL LOW (ref 98–111)
Creatinine: 1.54 mg/dL — ABNORMAL HIGH (ref 0.44–1.00)
GFR, Estimated: 36 mL/min — ABNORMAL LOW (ref >60)
Glucose, Bld: 123 mg/dL — ABNORMAL HIGH (ref 70–99)
Potassium: 3.4 mmol/L — ABNORMAL LOW (ref 3.5–5.1)
Sodium: 133 mmol/L — ABNORMAL LOW (ref 135–145)
Total Bilirubin: 0.6 mg/dL (ref 0.0–1.2)
Total Protein: 7.9 g/dL (ref 6.5–8.1)

## 2024-01-28 LAB — MAGNESIUM: Magnesium: 1.3 mg/dL — ABNORMAL LOW (ref 1.7–2.4)

## 2024-01-28 MED ORDER — SODIUM CHLORIDE 0.9 % IV SOLN: Freq: Once | INTRAVENOUS | Status: DC

## 2024-01-28 MED ORDER — SODIUM CHLORIDE 0.9% FLUSH
10.0000 mL | INTRAVENOUS | Status: DC | PRN
  Administered 2024-01-28: 10 mL

## 2024-01-28 MED ORDER — TRASTUZUMAB-DTTB CHEMO 150 MG IV SOLR
750.0000 mg | Freq: Once | INTRAVENOUS | Status: AC
  Administered 2024-01-28: 750 mg via INTRAVENOUS
  Filled 2024-01-28: qty 35.71

## 2024-01-28 MED ORDER — HEPARIN SOD (PORK) LOCK FLUSH 100 UNIT/ML IV SOLN
500.0000 [IU] | Freq: Once | INTRAVENOUS | Status: AC | PRN
  Administered 2024-01-28: 500 [IU]

## 2024-01-28 MED ORDER — LORAZEPAM 2 MG/ML IJ SOLN
1.0000 mg | Freq: Once | INTRAMUSCULAR | Status: AC
  Administered 2024-01-28: 1 mg via INTRAVENOUS
  Filled 2024-01-28: qty 1

## 2024-01-28 MED ORDER — ACETAMINOPHEN 325 MG PO TABS
650.0000 mg | ORAL_TABLET | Freq: Once | ORAL | Status: AC
  Administered 2024-01-28: 650 mg via ORAL
  Filled 2024-01-28: qty 2

## 2024-01-28 MED ORDER — TEMAZEPAM 30 MG PO CAPS: 30.0000 mg | ORAL_CAPSULE | Freq: Every day | ORAL | 2 refills | Status: DC

## 2024-01-28 NOTE — Patient Instructions (Signed)

## 2024-01-28 NOTE — Assessment & Plan Note (Signed)
 2004: Liver. Lung and bone mets Neoadj chemo TCH X 6 completed April 2005 foll by herceptin  maintenance   bilateral mastectomies with bilateral axillary lymph node dissection 12/07/2004, showing             (a) on the right, a mypT1c ypN1 invasive ductal carcinoma, grade 3, estrogen and progesterone receptor negative, HER-2 positive, with an MIB-1 of 31%             (b) on the left, ypT2 ypN1 invasive ductal carcinoma, grade 2, estrogen and progesterone receptor negative, HER-2 positive, with an MIB-1 of 35%.   Adj XRT ----------------------------------------------------------------------------------------------------------------------------------------  Current treatment: Herceptin  maintenance therapy Plan to continue Herceptin  indefinitely every 28 days.  SVC syndrome: On lifelong anticoagulation.  (Because of cost issues we may be switching her from Eliquis  to Pradaxa.  I gave her additional information about Pradaxa and the cost)   Bone scan 09/27/2022: No bone metastases ECHO: 10/31/22: EF 55% (Dr. Mitzie Anda is doing her echocardiograms now every 9 months) CT CAP 05/05/2023: No evidence of recurrent or metastatic disease in chest abdomen pelvis CT CAP 11/04/2023: No evidence of recurrent or metastatic disease.  Healing or healed sternal/anterolateral right fifth and sixth rib fractures.   Cost of treatment: Patient is not able to afford her medications and is having a very difficult time.  I will see if she could qualify for the [employer].  Assessment and Plan Assessment & Plan Metastatic breast cancer Currently receiving Herceptin  without issues. Recent hematemesis resolved with IV Protonix  and fluids; EGD negative. Discharged on daily Protonix . - Continue Herceptin  treatment.  Pain in left hip Significant pain with questionable erosion in the left SI joint on CT. Differential includes inflammation or cancer. Pain managed with oxycodone . - Order MRI of the left hip to evaluate questionable  erosion. - Continue oxycodone  for pain management.  Hypomagnesemia Hypomagnesemia noted during recent hospitalization, magnesium  levels were replaced. - Obtain labs to check current magnesium  levels during port access for Herceptin  infusion.  Hypokalemia Hypokalemia noted during recent hospitalization, potassium levels were replaced. Ongoing potassium supplementation reported. - Obtain labs to check current potassium levels during port access for Herceptin  infusion.  RTC monthly for Herceptin , or sooner if needed.
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
## 2024-01-28 NOTE — Telephone Encounter (Signed)
 Called pt to f/u before scheduling MRI due to pt location. Left voice message to call office back to confirm scheduling times and also provided pt with centralized scheduling number if she wanted to call and self schedule.

## 2024-01-29 ENCOUNTER — Ambulatory Visit: Payer: Self-pay

## 2024-01-29 NOTE — Telephone Encounter (Signed)
-----   Message from Conway Dennis sent at 01/28/2024  3:46 PM EDT ----- Will you call patient and get the exact details of her potassium and magnesium  supplementation?  Name of supplement Dosage Route Frequency Whether she is taking or not?  Thanks, LC ----- Message ----- From: Interface, Lab In [place] Sent: 01/28/2024  11:55 AM EDT To: Percival Brace, NP

## 2024-01-29 NOTE — Telephone Encounter (Signed)
-----   Message from Nurse Rice Chamorro B sent at 01/29/2024  3:35 PM EDT ----- She is taking [place] Potassium 99 mg 2 caplets PO daily and [place] Magnesium  400 mg 2 tabs PO daily. ----- Message ----- From: Percival Brace, NP Sent: 01/28/2024   3:47 PM EDT To: [facility] B[facility] 4  Will you call patient and get the exact details of her potassium and magnesium  supplementation?  Name of supplement Dosage Route Frequency Whether she is taking or not?  Thanks, LC ----- Message ----- From: Interface, Lab In [place] Sent: 01/28/2024  11:55 AM EDT To: Percival Brace, NP

## 2024-01-30 ENCOUNTER — Other Ambulatory Visit (HOSPITAL_COMMUNITY): Payer: Self-pay

## 2024-01-30 DIAGNOSIS — Z7952 Long term (current) use of systemic steroids: Secondary | ICD-10-CM | POA: Diagnosis not present

## 2024-01-30 DIAGNOSIS — G8929 Other chronic pain: Secondary | ICD-10-CM | POA: Diagnosis not present

## 2024-01-30 DIAGNOSIS — I5032 Chronic diastolic (congestive) heart failure: Secondary | ICD-10-CM | POA: Diagnosis not present

## 2024-01-30 DIAGNOSIS — C50911 Malignant neoplasm of unspecified site of right female breast: Secondary | ICD-10-CM | POA: Diagnosis not present

## 2024-01-30 DIAGNOSIS — M25561 Pain in right knee: Secondary | ICD-10-CM | POA: Diagnosis not present

## 2024-01-30 DIAGNOSIS — C7989 Secondary malignant neoplasm of other specified sites: Secondary | ICD-10-CM | POA: Diagnosis not present

## 2024-01-30 DIAGNOSIS — G9341 Metabolic encephalopathy: Secondary | ICD-10-CM | POA: Diagnosis not present

## 2024-01-30 DIAGNOSIS — C50912 Malignant neoplasm of unspecified site of left female breast: Secondary | ICD-10-CM | POA: Diagnosis not present

## 2024-01-30 DIAGNOSIS — G629 Polyneuropathy, unspecified: Secondary | ICD-10-CM | POA: Diagnosis not present

## 2024-01-30 DIAGNOSIS — I110 Hypertensive heart disease with heart failure: Secondary | ICD-10-CM | POA: Diagnosis not present

## 2024-01-30 DIAGNOSIS — M25552 Pain in left hip: Secondary | ICD-10-CM | POA: Diagnosis not present

## 2024-01-30 DIAGNOSIS — F419 Anxiety disorder, unspecified: Secondary | ICD-10-CM | POA: Diagnosis not present

## 2024-01-30 DIAGNOSIS — C7951 Secondary malignant neoplasm of bone: Secondary | ICD-10-CM | POA: Diagnosis not present

## 2024-01-30 DIAGNOSIS — Z7901 Long term (current) use of anticoagulants: Secondary | ICD-10-CM | POA: Diagnosis not present

## 2024-01-30 DIAGNOSIS — D630 Anemia in neoplastic disease: Secondary | ICD-10-CM | POA: Diagnosis not present

## 2024-01-30 DIAGNOSIS — Z86718 Personal history of other venous thrombosis and embolism: Secondary | ICD-10-CM | POA: Diagnosis not present

## 2024-01-30 DIAGNOSIS — L02416 Cutaneous abscess of left lower limb: Secondary | ICD-10-CM | POA: Diagnosis not present

## 2024-01-30 DIAGNOSIS — Z6841 Body Mass Index (BMI) 40.0 and over, adult: Secondary | ICD-10-CM | POA: Diagnosis not present

## 2024-01-30 DIAGNOSIS — Z86711 Personal history of pulmonary embolism: Secondary | ICD-10-CM | POA: Diagnosis not present

## 2024-01-30 DIAGNOSIS — M25562 Pain in left knee: Secondary | ICD-10-CM | POA: Diagnosis not present

## 2024-01-30 MED ORDER — CARVEDILOL 12.5 MG PO TABS: 18.7500 mg | ORAL_TABLET | Freq: Two times a day (BID) | ORAL | 11 refills | Status: DC

## 2024-02-03 ENCOUNTER — Other Ambulatory Visit: Payer: Self-pay | Admitting: Adult Health

## 2024-02-03 ENCOUNTER — Telehealth: Payer: Self-pay | Admitting: Surgery

## 2024-02-03 DIAGNOSIS — E876 Hypokalemia: Secondary | ICD-10-CM

## 2024-02-03 NOTE — Telephone Encounter (Signed)
 Called patient to check on status of MRI that was ordered but not scheduled. Patient stated she received message regarding this but hadn't returned call. She requested phone number for MRI so she can schedule it herself. Phone number given. Patient also needs appointment for fluids and magnesium . While on phone with patient discussed scheduling and patient agreed to come 6/10 at 8am for fluids and magnesium . No other concerns at this time.

## 2024-02-04 ENCOUNTER — Inpatient Hospital Stay

## 2024-02-04 VITALS — BP 97/66 | HR 72 | Resp 19

## 2024-02-04 DIAGNOSIS — E876 Hypokalemia: Secondary | ICD-10-CM | POA: Diagnosis not present

## 2024-02-04 DIAGNOSIS — Z5112 Encounter for antineoplastic immunotherapy: Secondary | ICD-10-CM | POA: Diagnosis not present

## 2024-02-04 DIAGNOSIS — C50911 Malignant neoplasm of unspecified site of right female breast: Secondary | ICD-10-CM | POA: Diagnosis not present

## 2024-02-04 DIAGNOSIS — C773 Secondary and unspecified malignant neoplasm of axilla and upper limb lymph nodes: Secondary | ICD-10-CM | POA: Diagnosis not present

## 2024-02-04 DIAGNOSIS — Z95828 Presence of other vascular implants and grafts: Secondary | ICD-10-CM

## 2024-02-04 DIAGNOSIS — C50812 Malignant neoplasm of overlapping sites of left female breast: Secondary | ICD-10-CM | POA: Diagnosis not present

## 2024-02-04 MED ORDER — HEPARIN SOD (PORK) LOCK FLUSH 100 UNIT/ML IV SOLN: 500.0000 [IU] | Freq: Once | INTRAVENOUS | Status: DC | PRN

## 2024-02-04 MED ORDER — SODIUM CHLORIDE 0.9 % IV SOLN: Freq: Once | INTRAVENOUS | Status: AC

## 2024-02-04 MED ORDER — POTASSIUM CHLORIDE IN NACL 20-0.9 MEQ/L-% IV SOLN
Freq: Once | INTRAVENOUS | Status: AC
  Filled 2024-02-04: qty 1000

## 2024-02-04 MED ORDER — MAGNESIUM SULFATE 2 GM/50ML IV SOLN
2.0000 g | Freq: Once | INTRAVENOUS | Status: AC
  Administered 2024-02-04: 2 g via INTRAVENOUS
  Filled 2024-02-04: qty 50

## 2024-02-04 MED ORDER — SODIUM CHLORIDE 0.9% FLUSH: 10.0000 mL | INTRAVENOUS | Status: DC | PRN

## 2024-02-04 NOTE — Progress Notes (Signed)
 Pt here for IVF/KCL and Mag.  BP soft this morning 70s/50s.  Per Dr Gudena, pt to get a liter over an hour prior to electrolytes.  Order placed.  Pt to return in 1 week to see Dr Lee Public.  Pt aware and verbalized understanding

## 2024-02-04 NOTE — Patient Instructions (Signed)

## 2024-02-06 ENCOUNTER — Telehealth: Payer: Self-pay | Admitting: Hematology and Oncology

## 2024-02-06 NOTE — Telephone Encounter (Signed)
 left vm for this pt to call back and schedule appt

## 2024-02-11 DIAGNOSIS — M25552 Pain in left hip: Secondary | ICD-10-CM | POA: Diagnosis not present

## 2024-02-11 DIAGNOSIS — C7951 Secondary malignant neoplasm of bone: Secondary | ICD-10-CM | POA: Diagnosis not present

## 2024-02-11 DIAGNOSIS — G8929 Other chronic pain: Secondary | ICD-10-CM | POA: Diagnosis not present

## 2024-02-11 DIAGNOSIS — M25562 Pain in left knee: Secondary | ICD-10-CM | POA: Diagnosis not present

## 2024-02-11 DIAGNOSIS — L02416 Cutaneous abscess of left lower limb: Secondary | ICD-10-CM | POA: Diagnosis not present

## 2024-02-11 DIAGNOSIS — M25561 Pain in right knee: Secondary | ICD-10-CM | POA: Diagnosis not present

## 2024-02-12 ENCOUNTER — Inpatient Hospital Stay

## 2024-02-12 ENCOUNTER — Ambulatory Visit (HOSPITAL_COMMUNITY)
Admission: RE | Admit: 2024-02-12 | Discharge: 2024-02-12 | Disposition: A | Discharge status: Home / Self Care | Source: Ambulatory Visit | Attending: Adult Health | Admitting: Adult Health

## 2024-02-12 ENCOUNTER — Other Ambulatory Visit: Payer: Self-pay | Admitting: *Deleted

## 2024-02-12 ENCOUNTER — Inpatient Hospital Stay (HOSPITAL_BASED_OUTPATIENT_CLINIC_OR_DEPARTMENT_OTHER): Admitting: Adult Health

## 2024-02-12 ENCOUNTER — Encounter: Payer: Self-pay | Admitting: Adult Health

## 2024-02-12 VITALS — BP 118/80 | HR 88 | Temp 97.1°F | Resp 16 | Ht 63.0 in | Wt 225.1 lb

## 2024-02-12 DIAGNOSIS — R519 Headache, unspecified: Secondary | ICD-10-CM | POA: Insufficient documentation

## 2024-02-12 DIAGNOSIS — R9389 Abnormal findings on diagnostic imaging of other specified body structures: Secondary | ICD-10-CM | POA: Insufficient documentation

## 2024-02-12 DIAGNOSIS — Z0389 Encounter for observation for other suspected diseases and conditions ruled out: Secondary | ICD-10-CM | POA: Diagnosis not present

## 2024-02-12 DIAGNOSIS — M8618 Other acute osteomyelitis, other site: Secondary | ICD-10-CM | POA: Diagnosis not present

## 2024-02-12 DIAGNOSIS — Z66 Do not resuscitate: Secondary | ICD-10-CM | POA: Diagnosis not present

## 2024-02-12 DIAGNOSIS — C50911 Malignant neoplasm of unspecified site of right female breast: Secondary | ICD-10-CM | POA: Diagnosis not present

## 2024-02-12 DIAGNOSIS — R7881 Bacteremia: Secondary | ICD-10-CM | POA: Diagnosis not present

## 2024-02-12 DIAGNOSIS — Z7401 Bed confinement status: Secondary | ICD-10-CM | POA: Diagnosis not present

## 2024-02-12 DIAGNOSIS — N3000 Acute cystitis without hematuria: Secondary | ICD-10-CM | POA: Diagnosis not present

## 2024-02-12 DIAGNOSIS — R296 Repeated falls: Secondary | ICD-10-CM | POA: Diagnosis not present

## 2024-02-12 DIAGNOSIS — E66813 Obesity, class 3: Secondary | ICD-10-CM | POA: Diagnosis not present

## 2024-02-12 DIAGNOSIS — M25552 Pain in left hip: Secondary | ICD-10-CM | POA: Diagnosis not present

## 2024-02-12 DIAGNOSIS — F419 Anxiety disorder, unspecified: Secondary | ICD-10-CM | POA: Diagnosis not present

## 2024-02-12 DIAGNOSIS — I110 Hypertensive heart disease with heart failure: Secondary | ICD-10-CM | POA: Diagnosis not present

## 2024-02-12 DIAGNOSIS — M5126 Other intervertebral disc displacement, lumbar region: Secondary | ICD-10-CM | POA: Diagnosis not present

## 2024-02-12 DIAGNOSIS — D509 Iron deficiency anemia, unspecified: Secondary | ICD-10-CM

## 2024-02-12 DIAGNOSIS — B9620 Unspecified Escherichia coli [E. coli] as the cause of diseases classified elsewhere: Secondary | ICD-10-CM | POA: Diagnosis not present

## 2024-02-12 DIAGNOSIS — M4807 Spinal stenosis, lumbosacral region: Secondary | ICD-10-CM | POA: Diagnosis not present

## 2024-02-12 DIAGNOSIS — M4628 Osteomyelitis of vertebra, sacral and sacrococcygeal region: Secondary | ICD-10-CM | POA: Diagnosis not present

## 2024-02-12 DIAGNOSIS — E785 Hyperlipidemia, unspecified: Secondary | ICD-10-CM | POA: Diagnosis not present

## 2024-02-12 DIAGNOSIS — C50012 Malignant neoplasm of nipple and areola, left female breast: Secondary | ICD-10-CM | POA: Insufficient documentation

## 2024-02-12 DIAGNOSIS — A419 Sepsis, unspecified organism: Secondary | ICD-10-CM | POA: Diagnosis not present

## 2024-02-12 DIAGNOSIS — M869 Osteomyelitis, unspecified: Secondary | ICD-10-CM | POA: Diagnosis not present

## 2024-02-12 DIAGNOSIS — Z171 Estrogen receptor negative status [ER-]: Secondary | ICD-10-CM | POA: Insufficient documentation

## 2024-02-12 DIAGNOSIS — C50919 Malignant neoplasm of unspecified site of unspecified female breast: Secondary | ICD-10-CM

## 2024-02-12 DIAGNOSIS — Z95828 Presence of other vascular implants and grafts: Secondary | ICD-10-CM | POA: Diagnosis not present

## 2024-02-12 DIAGNOSIS — M4658 Other infective spondylopathies, sacral and sacrococcygeal region: Secondary | ICD-10-CM | POA: Diagnosis not present

## 2024-02-12 DIAGNOSIS — M0088 Arthritis due to other bacteria, vertebrae: Secondary | ICD-10-CM | POA: Diagnosis not present

## 2024-02-12 DIAGNOSIS — Z6839 Body mass index (BMI) 39.0-39.9, adult: Secondary | ICD-10-CM | POA: Diagnosis not present

## 2024-02-12 DIAGNOSIS — R399 Unspecified symptoms and signs involving the genitourinary system: Secondary | ICD-10-CM

## 2024-02-12 DIAGNOSIS — I5032 Chronic diastolic (congestive) heart failure: Secondary | ICD-10-CM | POA: Diagnosis not present

## 2024-02-12 DIAGNOSIS — C50011 Malignant neoplasm of nipple and areola, right female breast: Secondary | ICD-10-CM | POA: Insufficient documentation

## 2024-02-12 DIAGNOSIS — K6812 Psoas muscle abscess: Secondary | ICD-10-CM | POA: Diagnosis not present

## 2024-02-12 DIAGNOSIS — I10 Essential (primary) hypertension: Secondary | ICD-10-CM | POA: Diagnosis not present

## 2024-02-12 DIAGNOSIS — C7951 Secondary malignant neoplasm of bone: Secondary | ICD-10-CM | POA: Diagnosis not present

## 2024-02-12 DIAGNOSIS — S322XXA Fracture of coccyx, initial encounter for closed fracture: Secondary | ICD-10-CM | POA: Diagnosis not present

## 2024-02-12 DIAGNOSIS — W19XXXA Unspecified fall, initial encounter: Secondary | ICD-10-CM | POA: Diagnosis present

## 2024-02-12 DIAGNOSIS — M5127 Other intervertebral disc displacement, lumbosacral region: Secondary | ICD-10-CM | POA: Diagnosis not present

## 2024-02-12 DIAGNOSIS — K3533 Acute appendicitis with perforation and localized peritonitis, with abscess: Secondary | ICD-10-CM | POA: Diagnosis not present

## 2024-02-12 DIAGNOSIS — M6008 Infective myositis, other site: Secondary | ICD-10-CM | POA: Diagnosis not present

## 2024-02-12 DIAGNOSIS — M48061 Spinal stenosis, lumbar region without neurogenic claudication: Secondary | ICD-10-CM | POA: Diagnosis not present

## 2024-02-12 DIAGNOSIS — B951 Streptococcus, group B, as the cause of diseases classified elsewhere: Secondary | ICD-10-CM | POA: Diagnosis not present

## 2024-02-12 DIAGNOSIS — N390 Urinary tract infection, site not specified: Secondary | ICD-10-CM | POA: Diagnosis not present

## 2024-02-12 DIAGNOSIS — M009 Pyogenic arthritis, unspecified: Secondary | ICD-10-CM | POA: Diagnosis not present

## 2024-02-12 DIAGNOSIS — C787 Secondary malignant neoplasm of liver and intrahepatic bile duct: Secondary | ICD-10-CM | POA: Diagnosis not present

## 2024-02-12 DIAGNOSIS — C50912 Malignant neoplasm of unspecified site of left female breast: Secondary | ICD-10-CM | POA: Diagnosis not present

## 2024-02-12 LAB — CMP (CANCER CENTER ONLY)
ALT: 5 U/L (ref 0–44)
AST: 11 U/L — ABNORMAL LOW (ref 15–41)
Albumin: 3 g/dL — ABNORMAL LOW (ref 3.5–5.0)
Alkaline Phosphatase: 83 U/L (ref 38–126)
Anion gap: 9 (ref 5–15)
BUN: 13 mg/dL (ref 8–23)
CO2: 26 mmol/L (ref 22–32)
Calcium: 8.8 mg/dL — ABNORMAL LOW (ref 8.9–10.3)
Chloride: 102 mmol/L (ref 98–111)
Creatinine: 2.02 mg/dL — ABNORMAL HIGH (ref 0.44–1.00)
GFR, Estimated: 26 mL/min — ABNORMAL LOW (ref >60)
Glucose, Bld: 110 mg/dL — ABNORMAL HIGH (ref 70–99)
Potassium: 4.1 mmol/L (ref 3.5–5.1)
Sodium: 137 mmol/L (ref 135–145)
Total Bilirubin: 0.7 mg/dL (ref 0.0–1.2)
Total Protein: 7.5 g/dL (ref 6.5–8.1)

## 2024-02-12 LAB — CBC WITH DIFFERENTIAL (CANCER CENTER ONLY)
Abs Immature Granulocytes: 0.03 10*3/uL (ref 0.00–0.07)
Basophils Absolute: 0.1 10*3/uL (ref 0.0–0.1)
Basophils Relative: 1 %
Eosinophils Absolute: 0.3 10*3/uL (ref 0.0–0.5)
Eosinophils Relative: 3 %
HCT: 27.3 % — ABNORMAL LOW (ref 36.0–46.0)
Hemoglobin: 9.6 g/dL — ABNORMAL LOW (ref 12.0–15.0)
Immature Granulocytes: 0 %
Lymphocytes Relative: 33 %
Lymphs Abs: 3.4 10*3/uL (ref 0.7–4.0)
MCH: 27.5 pg (ref 26.0–34.0)
MCHC: 35.2 g/dL (ref 30.0–36.0)
MCV: 78.2 fL — ABNORMAL LOW (ref 80.0–100.0)
Monocytes Absolute: 0.9 10*3/uL (ref 0.1–1.0)
Monocytes Relative: 9 %
Neutro Abs: 5.6 10*3/uL (ref 1.7–7.7)
Neutrophils Relative %: 54 %
Platelet Count: 260 10*3/uL (ref 150–400)
RBC: 3.49 MIL/uL — ABNORMAL LOW (ref 3.87–5.11)
RDW: 15.9 % — ABNORMAL HIGH (ref 11.5–15.5)
WBC Count: 10.3 10*3/uL (ref 4.0–10.5)
nRBC: 0 % (ref 0.0–0.2)

## 2024-02-12 LAB — URINALYSIS, COMPLETE (UACMP) WITH MICROSCOPIC
Bilirubin Urine: NEGATIVE
Glucose, UA: NEGATIVE mg/dL
Hgb urine dipstick: NEGATIVE
Ketones, ur: NEGATIVE mg/dL
Nitrite: NEGATIVE
Protein, ur: 30 mg/dL — AB
Specific Gravity, Urine: 1.009 (ref 1.005–1.030)
WBC, UA: >50 WBC/hpf (ref 0–5)
pH: 5 (ref 5.0–8.0)

## 2024-02-12 LAB — IRON AND IRON BINDING CAPACITY (CC-WL,HP ONLY)
Iron: 99 ug/dL (ref 28–170)
Saturation Ratios: 44 % — ABNORMAL HIGH (ref 10.4–31.8)
TIBC: 227 ug/dL — ABNORMAL LOW (ref 250–450)
UIBC: 128 ug/dL — ABNORMAL LOW (ref 148–442)

## 2024-02-12 LAB — MAGNESIUM: Magnesium: 1.4 mg/dL — ABNORMAL LOW (ref 1.7–2.4)

## 2024-02-12 LAB — FERRITIN: Ferritin: 324 ng/mL — ABNORMAL HIGH (ref 11–307)

## 2024-02-12 LAB — VITAMIN B12: Vitamin B-12: 797 pg/mL (ref 180–914)

## 2024-02-12 MED ORDER — GADOBUTROL 1 MMOL/ML IV SOLN
10.0000 mL | Freq: Once | INTRAVENOUS | Status: AC | PRN
Start: 2024-02-12 — End: 2024-02-12
  Administered 2024-02-12: 10 mL via INTRAVENOUS

## 2024-02-12 MED ORDER — SODIUM CHLORIDE 0.9 % IV SOLN: INTRAVENOUS | Status: DC

## 2024-02-12 NOTE — Assessment & Plan Note (Addendum)
 2004: Liver. Lung and bone mets Neoadj chemo TCH X 6 completed April 2005 foll by herceptin  maintenance   bilateral mastectomies with bilateral axillary lymph node dissection 12/07/2004, showing             (a) on the right, a mypT1c ypN1 invasive ductal carcinoma, grade 3, estrogen and progesterone receptor negative, HER-2 positive, with an MIB-1 of 31%             (b) on the left, ypT2 ypN1 invasive ductal carcinoma, grade 2, estrogen and progesterone receptor negative, HER-2 positive, with an MIB-1 of 35%.   Adj XRT ----------------------------------------------------------------------------------------------------------------------------------------  Current treatment: Herceptin  maintenance therapy Plan to continue Herceptin  indefinitely every 28 days.  SVC syndrome: On lifelong anticoagulation.  (Because of cost issues we may be switching her from Eliquis  to Pradaxa.  I gave her additional information about Pradaxa and the cost)   Bone scan 09/27/2022: No bone metastases ECHO: 10/31/22: EF 55% (Dr. Rolan is doing her echocardiograms now every 9 months) CT CAP 05/05/2023: No evidence of recurrent or metastatic disease in chest abdomen pelvis CT CAP 11/04/2023: No evidence of recurrent or metastatic disease.  Healing or healed sternal/anterolateral right fifth and sixth rib fractures.   Cost of treatment: Patient is not able to afford her medications and is having a very difficult time.  I will see if she could qualify for the [employer].  Assessment and Plan Assessment & Plan Nausea and vomiting Intermittent nausea and vomiting managed with ondansetron . No hematemesis. - Ensure ondansetron  is available as needed. - Refresh prescription if outdated. - Continue Protonix   Blood in stool Reported hematochezia without hematemesis. - Follows with GI - Iron studies today  Electrolyte imbalance Previous hypomagnesemia and hypokalemia. Currently on supplements. Re-evaluation required. -  Recheck electrolyte levels, including magnesium  and potassium.  Dehydration Possible dehydration due to low fluid intake and recent nausea. - Administer IV fluids today with one liter of normal saline - Encourage increased water  intake.  Hip pain Persistent hip pain with previous negative x-ray. Recent CT suggests joint issue and possibly cancer eroding her hip.   - MRI of the hip ordered - Continue with current pain medication regimen.  Possible urinary tract infection Affect change, raising concern for possible UTI. No current urinary symptoms. - Collect urine sample for urinalysis to rule out infection.  Headache Patient has HER2 positive metastatic breast cancer.  MRI ordered. -MRI brain w/wo contrast  Red flags reviewed with patient and her daughter of when to go to ER.  Will f/u as the above results are available.
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
## 2024-02-13 ENCOUNTER — Telehealth: Payer: Self-pay | Admitting: *Deleted

## 2024-02-13 ENCOUNTER — Encounter: Payer: Self-pay | Admitting: Hematology and Oncology

## 2024-02-13 ENCOUNTER — Encounter (HOSPITAL_COMMUNITY): Payer: Self-pay

## 2024-02-13 ENCOUNTER — Other Ambulatory Visit: Payer: Self-pay

## 2024-02-13 ENCOUNTER — Inpatient Hospital Stay (HOSPITAL_COMMUNITY)
Admission: EM | Admit: 2024-02-13 | Discharge: 2024-02-20 | DRG: 501 | Disposition: A | Discharge status: Home Health | Attending: Family Medicine | Admitting: Family Medicine

## 2024-02-13 ENCOUNTER — Encounter: Payer: Self-pay | Admitting: Oncology

## 2024-02-13 ENCOUNTER — Encounter: Payer: Self-pay | Admitting: Internal Medicine

## 2024-02-13 ENCOUNTER — Emergency Department (HOSPITAL_COMMUNITY)

## 2024-02-13 DIAGNOSIS — Z880 Allergy status to penicillin: Secondary | ICD-10-CM

## 2024-02-13 DIAGNOSIS — M009 Pyogenic arthritis, unspecified: Secondary | ICD-10-CM

## 2024-02-13 DIAGNOSIS — Z86711 Personal history of pulmonary embolism: Secondary | ICD-10-CM

## 2024-02-13 DIAGNOSIS — M25552 Pain in left hip: Secondary | ICD-10-CM | POA: Diagnosis present

## 2024-02-13 DIAGNOSIS — I5032 Chronic diastolic (congestive) heart failure: Secondary | ICD-10-CM | POA: Diagnosis present

## 2024-02-13 DIAGNOSIS — W19XXXA Unspecified fall, initial encounter: Secondary | ICD-10-CM | POA: Diagnosis present

## 2024-02-13 DIAGNOSIS — D638 Anemia in other chronic diseases classified elsewhere: Secondary | ICD-10-CM | POA: Diagnosis present

## 2024-02-13 DIAGNOSIS — M869 Osteomyelitis, unspecified: Secondary | ICD-10-CM | POA: Insufficient documentation

## 2024-02-13 DIAGNOSIS — M5126 Other intervertebral disc displacement, lumbar region: Secondary | ICD-10-CM | POA: Diagnosis present

## 2024-02-13 DIAGNOSIS — Z8249 Family history of ischemic heart disease and other diseases of the circulatory system: Secondary | ICD-10-CM

## 2024-02-13 DIAGNOSIS — M48061 Spinal stenosis, lumbar region without neurogenic claudication: Secondary | ICD-10-CM | POA: Diagnosis present

## 2024-02-13 DIAGNOSIS — C50912 Malignant neoplasm of unspecified site of left female breast: Secondary | ICD-10-CM | POA: Diagnosis present

## 2024-02-13 DIAGNOSIS — N390 Urinary tract infection, site not specified: Secondary | ICD-10-CM | POA: Diagnosis present

## 2024-02-13 DIAGNOSIS — M4658 Other infective spondylopathies, sacral and sacrococcygeal region: Secondary | ICD-10-CM

## 2024-02-13 DIAGNOSIS — Z66 Do not resuscitate: Secondary | ICD-10-CM | POA: Diagnosis present

## 2024-02-13 DIAGNOSIS — F419 Anxiety disorder, unspecified: Secondary | ICD-10-CM | POA: Diagnosis present

## 2024-02-13 DIAGNOSIS — C50911 Malignant neoplasm of unspecified site of right female breast: Secondary | ICD-10-CM | POA: Diagnosis present

## 2024-02-13 DIAGNOSIS — Z833 Family history of diabetes mellitus: Secondary | ICD-10-CM

## 2024-02-13 DIAGNOSIS — B9620 Unspecified Escherichia coli [E. coli] as the cause of diseases classified elsewhere: Secondary | ICD-10-CM | POA: Diagnosis present

## 2024-02-13 DIAGNOSIS — M0088 Arthritis due to other bacteria, vertebrae: Secondary | ICD-10-CM | POA: Diagnosis not present

## 2024-02-13 DIAGNOSIS — Z6839 Body mass index (BMI) 39.0-39.9, adult: Secondary | ICD-10-CM | POA: Diagnosis not present

## 2024-02-13 DIAGNOSIS — I10 Essential (primary) hypertension: Secondary | ICD-10-CM | POA: Diagnosis present

## 2024-02-13 DIAGNOSIS — Z7401 Bed confinement status: Secondary | ICD-10-CM | POA: Diagnosis not present

## 2024-02-13 DIAGNOSIS — R7881 Bacteremia: Secondary | ICD-10-CM | POA: Diagnosis present

## 2024-02-13 DIAGNOSIS — Z79899 Other long term (current) drug therapy: Secondary | ICD-10-CM

## 2024-02-13 DIAGNOSIS — D509 Iron deficiency anemia, unspecified: Secondary | ICD-10-CM | POA: Diagnosis present

## 2024-02-13 DIAGNOSIS — N3000 Acute cystitis without hematuria: Secondary | ICD-10-CM

## 2024-02-13 DIAGNOSIS — K219 Gastro-esophageal reflux disease without esophagitis: Secondary | ICD-10-CM | POA: Diagnosis present

## 2024-02-13 DIAGNOSIS — S322XXA Fracture of coccyx, initial encounter for closed fracture: Secondary | ICD-10-CM | POA: Diagnosis present

## 2024-02-13 DIAGNOSIS — Z789 Other specified health status: Secondary | ICD-10-CM

## 2024-02-13 DIAGNOSIS — E66813 Obesity, class 3: Secondary | ICD-10-CM | POA: Diagnosis present

## 2024-02-13 DIAGNOSIS — Z9049 Acquired absence of other specified parts of digestive tract: Secondary | ICD-10-CM

## 2024-02-13 DIAGNOSIS — E785 Hyperlipidemia, unspecified: Secondary | ICD-10-CM | POA: Insufficient documentation

## 2024-02-13 DIAGNOSIS — Z91410 Personal history of adult physical and sexual abuse: Secondary | ICD-10-CM

## 2024-02-13 DIAGNOSIS — C7951 Secondary malignant neoplasm of bone: Secondary | ICD-10-CM | POA: Diagnosis present

## 2024-02-13 DIAGNOSIS — Z91048 Other nonmedicinal substance allergy status: Secondary | ICD-10-CM

## 2024-02-13 DIAGNOSIS — R296 Repeated falls: Secondary | ICD-10-CM | POA: Diagnosis present

## 2024-02-13 DIAGNOSIS — C50919 Malignant neoplasm of unspecified site of unspecified female breast: Secondary | ICD-10-CM | POA: Diagnosis present

## 2024-02-13 DIAGNOSIS — C787 Secondary malignant neoplasm of liver and intrahepatic bile duct: Secondary | ICD-10-CM | POA: Diagnosis present

## 2024-02-13 DIAGNOSIS — Z9013 Acquired absence of bilateral breasts and nipples: Secondary | ICD-10-CM

## 2024-02-13 DIAGNOSIS — M4628 Osteomyelitis of vertebra, sacral and sacrococcygeal region: Secondary | ICD-10-CM | POA: Diagnosis present

## 2024-02-13 DIAGNOSIS — Z171 Estrogen receptor negative status [ER-]: Secondary | ICD-10-CM

## 2024-02-13 DIAGNOSIS — I509 Heart failure, unspecified: Secondary | ICD-10-CM

## 2024-02-13 DIAGNOSIS — M6008 Infective myositis, other site: Principal | ICD-10-CM | POA: Diagnosis present

## 2024-02-13 DIAGNOSIS — K3533 Acute appendicitis with perforation and localized peritonitis, with abscess: Secondary | ICD-10-CM | POA: Diagnosis present

## 2024-02-13 DIAGNOSIS — M8618 Other acute osteomyelitis, other site: Secondary | ICD-10-CM | POA: Diagnosis present

## 2024-02-13 DIAGNOSIS — Z95828 Presence of other vascular implants and grafts: Secondary | ICD-10-CM

## 2024-02-13 DIAGNOSIS — D630 Anemia in neoplastic disease: Secondary | ICD-10-CM | POA: Diagnosis present

## 2024-02-13 DIAGNOSIS — B951 Streptococcus, group B, as the cause of diseases classified elsewhere: Secondary | ICD-10-CM | POA: Diagnosis present

## 2024-02-13 DIAGNOSIS — Z7901 Long term (current) use of anticoagulants: Secondary | ICD-10-CM

## 2024-02-13 DIAGNOSIS — I959 Hypotension, unspecified: Secondary | ICD-10-CM | POA: Diagnosis not present

## 2024-02-13 DIAGNOSIS — Z604 Social exclusion and rejection: Secondary | ICD-10-CM | POA: Diagnosis present

## 2024-02-13 DIAGNOSIS — I110 Hypertensive heart disease with heart failure: Secondary | ICD-10-CM | POA: Diagnosis present

## 2024-02-13 DIAGNOSIS — Z9221 Personal history of antineoplastic chemotherapy: Secondary | ICD-10-CM

## 2024-02-13 LAB — COMPREHENSIVE METABOLIC PANEL WITH GFR
ALT: 8 U/L (ref 0–44)
AST: 12 U/L — ABNORMAL LOW (ref 15–41)
Albumin: 2.5 g/dL — ABNORMAL LOW (ref 3.5–5.0)
Alkaline Phosphatase: 75 U/L (ref 38–126)
Anion gap: 11 (ref 5–15)
BUN: 13 mg/dL (ref 8–23)
CO2: 21 mmol/L — ABNORMAL LOW (ref 22–32)
Calcium: 8.5 mg/dL — ABNORMAL LOW (ref 8.9–10.3)
Chloride: 103 mmol/L (ref 98–111)
Creatinine, Ser: 1.52 mg/dL — ABNORMAL HIGH (ref 0.44–1.00)
GFR, Estimated: 36 mL/min — ABNORMAL LOW (ref >60)
Glucose, Bld: 102 mg/dL — ABNORMAL HIGH (ref 70–99)
Potassium: 3.8 mmol/L (ref 3.5–5.1)
Sodium: 135 mmol/L (ref 135–145)
Total Bilirubin: 1.2 mg/dL (ref 0.0–1.2)
Total Protein: 7.2 g/dL (ref 6.5–8.1)

## 2024-02-13 LAB — URINALYSIS, W/ REFLEX TO CULTURE (INFECTION SUSPECTED)
Bilirubin Urine: NEGATIVE
Glucose, UA: NEGATIVE mg/dL
Ketones, ur: NEGATIVE mg/dL
Nitrite: POSITIVE — AB
Protein, ur: NEGATIVE mg/dL
Specific Gravity, Urine: 1.012 (ref 1.005–1.030)
WBC, UA: >50 WBC/hpf (ref 0–5)
pH: 5 (ref 5.0–8.0)

## 2024-02-13 LAB — CBC WITH DIFFERENTIAL/PLATELET
Abs Immature Granulocytes: 0.03 10*3/uL (ref 0.00–0.07)
Basophils Absolute: 0.1 10*3/uL (ref 0.0–0.1)
Basophils Relative: 1 %
Eosinophils Absolute: 0.2 10*3/uL (ref 0.0–0.5)
Eosinophils Relative: 2 %
HCT: 27.3 % — ABNORMAL LOW (ref 36.0–46.0)
Hemoglobin: 9.1 g/dL — ABNORMAL LOW (ref 12.0–15.0)
Immature Granulocytes: 0 %
Lymphocytes Relative: 33 %
Lymphs Abs: 3.2 10*3/uL (ref 0.7–4.0)
MCH: 27.5 pg (ref 26.0–34.0)
MCHC: 33.3 g/dL (ref 30.0–36.0)
MCV: 82.5 fL (ref 80.0–100.0)
Monocytes Absolute: 1 10*3/uL (ref 0.1–1.0)
Monocytes Relative: 10 %
Neutro Abs: 5.2 10*3/uL (ref 1.7–7.7)
Neutrophils Relative %: 54 %
Platelets: 234 10*3/uL (ref 150–400)
RBC: 3.31 MIL/uL — ABNORMAL LOW (ref 3.87–5.11)
RDW: 15.9 % — ABNORMAL HIGH (ref 11.5–15.5)
WBC: 9.6 10*3/uL (ref 4.0–10.5)
nRBC: 0 % (ref 0.0–0.2)

## 2024-02-13 LAB — I-STAT CG4 LACTIC ACID, ED: Lactic Acid, Venous: 0.5 mmol/L (ref 0.5–1.9)

## 2024-02-13 LAB — PROTIME-INR
INR: 1.3 — ABNORMAL HIGH (ref 0.8–1.2)
Prothrombin Time: 16.4 s — ABNORMAL HIGH (ref 11.4–15.2)

## 2024-02-13 LAB — SEDIMENTATION RATE: Sed Rate: 96 mm/h — ABNORMAL HIGH (ref 0–22)

## 2024-02-13 LAB — C-REACTIVE PROTEIN: CRP: 2.1 mg/dL — ABNORMAL HIGH (ref <1.0)

## 2024-02-13 MED ORDER — SODIUM CHLORIDE 0.9 % IV SOLN: 2.0000 g | INTRAVENOUS | Status: DC

## 2024-02-13 MED ORDER — ALBUTEROL SULFATE (2.5 MG/3ML) 0.083% IN NEBU: 2.5000 mg | INHALATION_SOLUTION | RESPIRATORY_TRACT | Status: DC | PRN

## 2024-02-13 MED ORDER — ACETAMINOPHEN 325 MG PO TABS
650.0000 mg | ORAL_TABLET | Freq: Four times a day (QID) | ORAL | Status: DC | PRN
  Administered 2024-02-18: 650 mg via ORAL
  Filled 2024-02-13 (×2): qty 2

## 2024-02-13 MED ORDER — PANTOPRAZOLE SODIUM 40 MG PO TBEC
40.0000 mg | DELAYED_RELEASE_TABLET | Freq: Every day | ORAL | Status: DC
  Administered 2024-02-14 – 2024-02-20 (×7): 40 mg via ORAL
  Filled 2024-02-13 (×7): qty 1

## 2024-02-13 MED ORDER — ACETAMINOPHEN 650 MG RE SUPP: 650.0000 mg | Freq: Four times a day (QID) | RECTAL | Status: DC | PRN

## 2024-02-13 MED ORDER — ROSUVASTATIN CALCIUM 5 MG PO TABS
10.0000 mg | ORAL_TABLET | Freq: Every day | ORAL | Status: DC
  Administered 2024-02-14 – 2024-02-20 (×7): 10 mg via ORAL
  Filled 2024-02-13 (×7): qty 2

## 2024-02-13 MED ORDER — ONDANSETRON HCL 4 MG/2ML IJ SOLN: 4.0000 mg | Freq: Four times a day (QID) | INTRAMUSCULAR | Status: DC | PRN

## 2024-02-13 MED ORDER — TEMAZEPAM 7.5 MG PO CAPS
30.0000 mg | ORAL_CAPSULE | Freq: Every day | ORAL | Status: DC
  Administered 2024-02-13 – 2024-02-14 (×2): 30 mg via ORAL
  Filled 2024-02-13 (×2): qty 4

## 2024-02-13 MED ORDER — ALPRAZOLAM 0.5 MG PO TABS
1.0000 mg | ORAL_TABLET | Freq: Three times a day (TID) | ORAL | Status: DC | PRN
  Administered 2024-02-15 – 2024-02-20 (×4): 1 mg via ORAL
  Filled 2024-02-13 (×4): qty 2

## 2024-02-13 MED ORDER — LORAZEPAM 2 MG/ML IJ SOLN
0.5000 mg | Freq: Once | INTRAMUSCULAR | Status: AC
  Administered 2024-02-13: 0.5 mg via INTRAVENOUS
  Filled 2024-02-13: qty 1

## 2024-02-13 MED ORDER — HYDROMORPHONE HCL 1 MG/ML IJ SOLN
0.5000 mg | Freq: Once | INTRAMUSCULAR | Status: AC
  Administered 2024-02-13: 0.5 mg via INTRAVENOUS
  Filled 2024-02-13: qty 1

## 2024-02-13 MED ORDER — ONDANSETRON HCL 4 MG PO TABS: 4.0000 mg | ORAL_TABLET | Freq: Four times a day (QID) | ORAL | Status: DC | PRN

## 2024-02-13 MED ORDER — ONDANSETRON HCL 4 MG/2ML IJ SOLN
4.0000 mg | Freq: Once | INTRAMUSCULAR | Status: AC
  Administered 2024-02-13: 4 mg via INTRAVENOUS
  Filled 2024-02-13: qty 2

## 2024-02-13 MED ORDER — HYDROMORPHONE HCL 1 MG/ML IJ SOLN: 0.5000 mg | INTRAMUSCULAR | Status: DC | PRN

## 2024-02-13 MED ORDER — OXYCODONE HCL 5 MG PO TABS: 10.0000 mg | ORAL_TABLET | ORAL | Status: DC | PRN

## 2024-02-13 MED ORDER — SENNOSIDES-DOCUSATE SODIUM 8.6-50 MG PO TABS: 1.0000 | ORAL_TABLET | Freq: Every evening | ORAL | Status: DC | PRN

## 2024-02-13 MED ORDER — CARVEDILOL 6.25 MG PO TABS
18.7500 mg | ORAL_TABLET | Freq: Two times a day (BID) | ORAL | Status: DC
  Administered 2024-02-13 – 2024-02-15 (×3): 18.75 mg via ORAL
  Filled 2024-02-13 (×3): qty 1

## 2024-02-13 NOTE — ED Notes (Signed)
 [facility] called for transport to [facility]. JRPRN

## 2024-02-13 NOTE — ED Provider Triage Note (Cosign Needed)
 Emergency Medicine Provider Triage Evaluation Note  [name] , a 73 y.o. female  was evaluated in triage.  Pt complains of as below. Here yesterday, had MRI outpatient. Had a call from the cancer center today and told she needs to be admitted for UTI. Infection in the hip on imaging. Here with daughter. Last fell on Sunday.  History of breast cancer, on chemo, last chemo 3 weeks ago, (scheduled q 28 days), no fevers in the past 2-3 weeks.  Review of Systems  Positive:  Negative:   Physical Exam  BP 127/83 (BP Location: Right Arm)   Pulse 88   Temp 98.1 F (36.7 C) (Oral)   Resp 16   SpO2 98%  Gen:   Awake, no distress   Resp:  Normal effort  MSK:   Moves extremities without difficulty  Other:    Medical Decision Making  Medically screening exam initiated at 11:40 AM.  Appropriate orders placed.  [name] was informed that the remainder of the evaluation will be completed by another provider, this initial triage assessment does not replace that evaluation, and the importance of remaining in the ED until their evaluation is complete.     Yolanda Eisenmenger, PA-C 02/13/24 1145

## 2024-02-13 NOTE — Progress Notes (Signed)
 ID Pharmacist Note  Discussed patient with Dr. Zelda Hickman, ID. Since Ms. Peedin is clinically stable we will hold antibiotics at this time in order to increase potential culture yield.    Plan Hold all antibiotics unless blood cultures positive  Denson Flake, PharmD, BCPS, BCIDP Infectious Diseases Clinical Pharmacist Phone: [phone] 02/13/2024 4:06 PM

## 2024-02-13 NOTE — Telephone Encounter (Signed)
 Per NP request, RN attempt x1 to contact pt to ensure pt is on her way to ED.  No answer, LVM.

## 2024-02-13 NOTE — Telephone Encounter (Signed)
 [name]

## 2024-02-13 NOTE — H&P (Signed)
 History and Physical  [name]:[id number] DOB: 1951/07/15 DOA: 02/13/2024  PCP: Olan Bering, MD   Chief Complaint: Left hip pain, abnormal MRI  HPI: [name] is a 73 y.o. female with medical history significant for chronic diastolic congestive heart failure, anxiety, PE on Eliquis , metastatic breast cancer on Herceptin  being admitted to the hospital for evaluation and management of left SI joint osteomyelitis and associated abscess.  History is provided by the patient as well as her daughter who is at the bedside.  It sounds like she has had pain in the left hip for quite a few weeks and this has been worked up with CT scans.  Yesterday, she had outpatient MRI of her pelvis and she was called by her provider to come to the ER due to concern for iliac us  abscess and left-sided SI joint osteomyelitis.  Patient denies any recent fevers or chills, no dysuria, cough, no recent bacterial infection.  Apparently yesterday she was not acting herself and so her oncology provider was concerned about a UTI.  Urine culture was obtained and this is positive for E. coli.  Review of Systems: Please see HPI for pertinent positives and negatives. A complete 10 system review of systems are otherwise negative.  Past Medical History:  Diagnosis Date   Anemia    pt pt report   Anxiety    Breast cancer (HCC)    mets to liver and lung   Breast cancer metastasized to multiple sites [facility]) 02/26/2013   Cellulitis    CHF (congestive heart failure) (HCC)    History of chemotherapy 09/2004   taxotere/herceptin /carboplatin   Hypertension    Neuropathy    Radiation 07/31/2006   left upper chest   Radiation 06/17/2006-06/27/2006   6480 cGy bilat. chest wall   SVC syndrome    Thrombosis    Past Surgical History:  Procedure Laterality Date   ANKLE SURGERY Left    BACK SURGERY     CHOLECYSTECTOMY  08/28/1987   ESOPHAGOGASTRODUODENOSCOPY Left 01/21/2024   Procedure: EGD  (ESOPHAGOGASTRODUODENOSCOPY);  Surgeon: Evangeline Hilts, MD;  Location: [facility];  Service: Gastroenterology;  Laterality: Left;   MASS EXCISION Left 05/10/2022   Procedure: EXCISION OF LEFT CHEST WALL MASS;  Surgeon: Sim Dryer, MD;  Location: [facility] OR;  Service: General;  Laterality: Left;   MASTECTOMY Bilateral    w/ lymph node removal per patient   PERIPHERALLY INSERTED CENTRAL CATHETER INSERTION     PICC LINE INSERTION     PICC LINE REMOVAL ([facility] HX)     SURGICAL EXCISION OF EXCESSIVE SKIN     TUBAL LIGATION  08/27/1984   Social History:  reports that she has never smoked. She has never been exposed to tobacco smoke. She has never used smokeless tobacco. She reports current alcohol  use. She reports that she does not use drugs.  Allergies  Allergen Reactions   Adhesive [Tape] Other (See Comments)    Tears the skin    Penicillins Hives and Other (See Comments)    PATIENT HAS TOLERATED Cephalosporins    Family History  Problem Relation Age of Onset   Heart failure Father    Cancer Father        Prostate cancer   Heart failure Brother    Cancer Brother        Prostate cancer   Diabetes Maternal Aunt      Prior to Admission medications   Medication Sig Start Date End Date Taking? Authorizing Provider  acetaminophen  (TYLENOL )  500 MG tablet Take 500-1,000 mg by mouth every 6 (six) hours as needed for mild pain (pain score 1-3), fever or moderate pain (pain score 4-6).    [provider]  albuterol  (VENTOLIN  HFA) 108 (90 Base) MCG/ACT inhaler Inhale 2 puffs into the lungs every 6 (six) hours as needed for wheezing. 06/28/22   Gudena, Vinay, MD  ALPRAZolam  (XANAX ) 1 MG tablet Take 1 tablet (1 mg total) by mouth 3 (three) times daily as needed for anxiety. 12/31/23   Gudena, Vinay, MD  amLODipine  (NORVASC ) 10 MG tablet Take 1 tablet (10 mg total) by mouth every morning. 09/10/19   Magrinat, Rozella Cornfield, MD  apixaban  (ELIQUIS ) 5 MG TABS tablet Take 1 tablet (5 mg total) by  mouth 2 (two) times daily. 08/06/23   Gudena, Vinay, MD  baclofen  (LIORESAL ) 10 MG tablet TAKE 1 TABLET BY MOUTH THREE TIMES DAILY AS NEEDED FOR MUSCLE SPASMS Patient taking differently: Take 10 mg by mouth as needed for muscle spasms. 05/19/21   Magrinat, Rozella Cornfield, MD  CALCIUM  PO Take 2 tablets by mouth daily.    [provider]  carvedilol  (COREG ) 12.5 MG tablet Take 1.5 tablets (18.75 mg total) by mouth 2 (two) times daily with a meal. 01/30/24   Darlis Eisenmenger, MD  cyclobenzaprine  (FLEXERIL ) 5 MG tablet Take 1 tablet (5 mg total) by mouth 2 (two) times daily as needed for muscle spasms. Patient not taking: Reported on 01/21/2024 12/31/23   Gudena, Vinay, MD  DULoxetine (CYMBALTA) 60 MG capsule Take 60 mg by mouth daily. 12/17/22   [provider]  furosemide  (LASIX ) 40 MG tablet Take 40 mg by mouth as needed for fluid or edema.    [provider]  gabapentin  (NEURONTIN ) 300 MG capsule TAKE 2 CAPSULES BY MOUTH THREE TIMES DAILY 04/15/23   Cameron Cea, MD  lidocaine  (LIDODERM ) 5 % Place 1 patch onto the skin daily. Remove & Discard patch within 12 hours or as directed by MD Patient not taking: Reported on 01/21/2024 01/17/24   Wynetta Heckle, MD  losartan  (COZAAR ) 100 MG tablet Take 100 mg by mouth every morning.    [provider]  MAGNESIUM  PO Take 2 tablets by mouth daily.    [provider]  ondansetron  (ZOFRAN ) 8 MG tablet Take 1 tablet (8 mg total) by mouth every 8 (eight) hours as needed for nausea or vomiting. 01/21/24   Unk Garb, DO  oxyCODONE -acetaminophen  (PERCOCET/ROXICET) 5-325 MG tablet Take 1-2 tablets by mouth every 6 (six) hours as needed for severe pain (pain score 7-10). Patient taking differently: Take 1 tablet by mouth daily as needed for severe pain (pain score 7-10) or moderate pain (pain score 4-6). 01/17/24   Wynetta Heckle, MD  pantoprazole  (PROTONIX ) 40 MG tablet Take 1 tablet (40 mg total) by mouth daily. 01/21/24 02/20/24  Unk Garb, DO  POTASSIUM PO Take 1 tablet by mouth daily.    [provider]  predniSONE (DELTASONE) 5 MG tablet Take 5 mg by mouth daily as needed (inflammation).    [provider]  rosuvastatin  (CRESTOR ) 10 MG tablet Take 1 tablet (10 mg total) by mouth daily. 11/14/23 11/13/24  Darlis Eisenmenger, MD  spironolactone  (ALDACTONE ) 25 MG tablet Take 1 tablet daily 01/24/24   McLean, Dalton S, MD  temazepam  (RESTORIL ) 30 MG capsule Take 1 capsule (30 mg total) by mouth at bedtime. 01/28/24   Percival Brace, NP    Physical Exam: BP 127/83 (BP Location: Right Arm)  Pulse 88   Temp 98.1 F (36.7 C) (Oral)   Resp 16   Ht 5' 3 (1.6 m)   Wt 102.1 kg   SpO2 98%   BMI 39.86 kg/m  General:  Alert, oriented, calm, in no acute distress, looks chronically ill but nontoxic.  Her daughter and husband are at the bedside. Cardiovascular: RRR, no murmurs or rubs, no peripheral edema.  She has a right anterior chest port, is status post bilateral mastectomy.  Chest port site is nontender and benign, with no surrounding tenderness or erythema. Respiratory: clear to auscultation bilaterally, no wheezes, no crackles  Abdomen: soft, nontender, nondistended, normal bowel tones heard  Skin: dry, no rashes  Musculoskeletal: no joint effusions, normal range of motion  Psychiatric: appropriate affect, normal speech  Neurologic: extraocular muscles intact, clear speech, moving all extremities with intact sensorium         Labs on Admission:  Basic Metabolic Panel: Recent Labs  Lab 02/12/24 1153 02/13/24 1148  NA 137 135  K 4.1 3.8  CL 102 103  CO2 26 21*  GLUCOSE 110* 102*  BUN 13 13  CREATININE 2.02* 1.52*  CALCIUM  8.8* 8.5*  MG 1.4*  --    Liver Function Tests: Recent Labs  Lab 02/12/24 1153 02/13/24 1148  AST 11* 12*  ALT 5 8  ALKPHOS 83 75  BILITOT 0.7 1.2  PROT 7.5 7.2  ALBUMIN 3.0* 2.5*   No results for input(s): LIPASE, AMYLASE in the last 168 hours. No  results for input(s): AMMONIA in the last 168 hours. CBC: Recent Labs  Lab 02/12/24 1153 02/13/24 1148  WBC 10.3 9.6  NEUTROABS 5.6 5.2  HGB 9.6* 9.1*  HCT 27.3* 27.3*  MCV 78.2* 82.5  PLT 260 234   Cardiac Enzymes: No results for input(s): CKTOTAL, CKMB, CKMBINDEX, TROPONINI in the last 168 hours. BNP (last 3 results) Recent Labs    11/12/23 1428  BNP 64.6    ProBNP (last 3 results) No results for input(s): PROBNP in the last 8760 hours.  CBG: No results for input(s): GLUCAP in the last 168 hours.  Radiological Exams on Admission: MR HIP LEFT W WO CONTRAST Result Date: 02/12/2024 EXAM DESCRIPTION: MR HIP LEFT W WO CONTRAST CLINICAL HISTORY: infected hip versus met cancer causing necrosis and erosion, please evaluate further COMPARISON: None Available. TECHNIQUE: MRI of the hip is performed according to our usual protocol with multiplanar multi sequence imaging. FINDINGS: There is moderate to severe marrow edema on either side of the left sacroiliac joint as well as small erosions and associated joint effusion. There is a moderate myositis to the left iliac as musculature. Fluid collection measuring close to 2 cm in greatest dimension consistent with intramuscular abscess. No significant degenerative change. There is moderate focal marrow edema to the mid coccyx. The marrow signal is otherwise unremarkable. Mild greater trochanteric bursal fluid on the left. No joint effusion. The tendons and musculature are otherwise unremarkable. IMPRESSION: Moderate to severe marrow edema on either side of the left sacroiliac joint as well as joint effusion and small erosions. Septic arthritis/osteomyelitis should BE excluded. Active inflammatory arthritis would also be in the differential. There is a focal fluid collection measuring up to 2 cm within the left iliacus musculature and associated myositis. This is suspicious for a small abscess. Given this finding, the adjacent sacroiliac  joint findings are likely infectious in etiology. Moderate focal marrow edema to a mid coccygeal segment which is probably a mild fracture. A second site of  infection should be excluded and this will need follow-up. Electronically signed by: Basilio Both MD 02/12/2024 09:18 PM EDT RP Workstation: [id number]   MR Brain W Wo Contrast Result Date: 02/12/2024 CLINICAL DATA:  Metastatic disease evaluation EXAM: MRI HEAD WITHOUT AND WITH CONTRAST TECHNIQUE: Multiplanar, multiecho pulse sequences of the brain and surrounding structures were obtained without and with intravenous contrast. CONTRAST:  10mL GADAVIST GADOBUTROL 1 MMOL/ML IV SOLN COMPARISON:  CT head 03/24/2021. FINDINGS: Brain: No acute infarction, hemorrhage, hydrocephalus, extra-axial collection or mass lesion. No pathologic enhancement. Vascular: Major arterial flow voids are maintained at the skull base. Skull and upper cervical spine: Normal marrow signal. Sinuses/Orbits: Clear sinuses.  No acute orbital findings. Other: No mastoid effusions. IMPRESSION: No evidence of acute intracranial abnormality or metastatic disease. Electronically Signed   By: Stevenson Elbe M.D.   On: 02/12/2024 19:50   Assessment/Plan [name] is a 73 y.o. female with medical history significant for chronic diastolic congestive heart failure, anxiety, PE on Eliquis , metastatic breast cancer on Herceptin  being admitted to the hospital for evaluation and management of left SI joint osteomyelitis and associated abscess.  SI joint osteomyelitis, iliac us  abscess-suspected based on imaging findings and would explain her pain in the left hip over the last few weeks. -Inpatient admission -ER provider discussed with Dr. Curtiss Dowdy of orthopedic surgery, who requested admission to [facility] -Discussed with ID Dr. Gillian Lacrosse, they will consult at [facility] -ER provider has discussed with IR who will consult for possible biopsy, she took Eliquis  last evening 6/18 -As such we  will start empiric IV antibiotics Rocephin  and vancomycin  as biopsy will be delayed -Follow-up peripheral blood cultures  UTI-empiric IV Rocephin   Chronic diastolic congestive heart failure-appears overall euvolemic, continue appropriate home medications including Coreg  and Aldactone   Metastatic breast cancer-outpatient oncology follow-up  History of PE-hold Eliquis  pending IR biopsy  GERD-Protonix   Hyperlipidemia-Crestor   DVT prophylaxis: SCDs only    Code Status: Limited: Do not attempt resuscitation (DNR) -DNR-LIMITED -Do Not Intubate/DNI   Consults called: ID, IR, Ortho  Admission status: The appropriate patient status for this patient is INPATIENT. Inpatient status is judged to be reasonable and necessary in order to provide the required intensity of service to ensure the patient's safety. The patient's presenting symptoms, physical exam findings, and initial radiographic and laboratory data in the context of their chronic comorbidities is felt to place them at high risk for further clinical deterioration. Furthermore, it is not anticipated that the patient will be medically stable for discharge from the hospital within 2 midnights of admission.    I certify that at the point of admission it is my clinical judgment that the patient will require inpatient hospital care spanning beyond 2 midnights from the point of admission due to high intensity of service, high risk for further deterioration and high frequency of surveillance required  Time spent: 55 minutes  Banita Lehn Rickey Charm MD [facility] Pager [phone]  If 7PM-7AM, please contact night-coverage [email or web address] Password [facility]  02/13/2024, 2:33 PM

## 2024-02-13 NOTE — Telephone Encounter (Signed)
 Per NP request, RN placed call to pt regarding recent hip MRI results and the need for pt to come to [facility] ED for admission and IV abx.  Pt verbalized understanding and states she will head that way now.  RN also contacted ED charge RN to alert them that pt is on her way.

## 2024-02-13 NOTE — ED Provider Notes (Signed)
 [place] [facility]MENT AT [facility] Provider Note   CSN: [id number] Arrival date & time: 02/13/24  1121     History  Chief Complaint  Patient presents with   CA Pt   Abnormal Labs   Hip Pain    [name] is a 73 y.o. female with metastatic breast cancer on chemotherapy, chronic diastolic heart failure, hypertension, history of PE on chronic anticoagulation who presents with left hip pain.   Outpatient MRI performed yesterday demonstrates: Moderate to severe marrow edema on either side of the left sacroiliac joint as well as joint effusion and small erosions. Septic arthritis/osteomyelitis should BE excluded. Active inflammatory arthritis would also be in the differential. There is a focal fluid collection measuring up to 2 cm within the left iliacus musculature and associated myositis. This is suspicious for a small abscess. Given this finding, the adjacent sacroiliac joint findings are likely infectious in etiology. Moderate focal marrow edema to a mid coccygeal segment which is probably a mild fracture. A second site of infection should be excluded and this will need follow-up.   Patient reports 6/10 pain in the left hip. Daughter states she's had problems with and pain in the left hip for months. Has had several falls and the last one was on Father's day, did fall onto her tailbone. Denies fevers but endorses chills. Endorses nausea, no vomiting. On Herceptin , last chemo 3 weeks ago, (scheduled q 28 days). Port in R upper chest.   Past Medical History:  Diagnosis Date   Anemia    pt pt report   Anxiety    Breast cancer (HCC)    mets to liver and lung   Breast cancer metastasized to multiple sites [facility]) 02/26/2013   Cellulitis    CHF (congestive heart failure) (HCC)    History of chemotherapy 09/2004   taxotere/herceptin /carboplatin   Hypertension    Neuropathy    Radiation 07/31/2006   left upper chest   Radiation 06/17/2006-06/27/2006    6480 cGy bilat. chest wall   SVC syndrome    Thrombosis        Home Medications Prior to Admission medications   Medication Sig Start Date End Date Taking? Authorizing Provider  acetaminophen  (TYLENOL ) 500 MG tablet Take 500-1,000 mg by mouth every 6 (six) hours as needed for mild pain (pain score 1-3), fever or moderate pain (pain score 4-6).    [provider]  albuterol  (VENTOLIN  HFA) 108 (90 Base) MCG/ACT inhaler Inhale 2 puffs into the lungs every 6 (six) hours as needed for wheezing. 06/28/22   Gudena, Vinay, MD  ALPRAZolam  (XANAX ) 1 MG tablet Take 1 tablet (1 mg total) by mouth 3 (three) times daily as needed for anxiety. 12/31/23   Gudena, Vinay, MD  amLODipine  (NORVASC ) 10 MG tablet Take 1 tablet (10 mg total) by mouth every morning. 09/10/19   Magrinat, Sandria BROCKS, MD  apixaban  (ELIQUIS ) 5 MG TABS tablet Take 1 tablet (5 mg total) by mouth 2 (two) times daily. 08/06/23   Gudena, Vinay, MD  baclofen  (LIORESAL ) 10 MG tablet TAKE 1 TABLET BY MOUTH THREE TIMES DAILY AS NEEDED FOR MUSCLE SPASMS Patient taking differently: Take 10 mg by mouth as needed for muscle spasms. 05/19/21   Magrinat, Sandria BROCKS, MD  CALCIUM  PO Take 2 tablets by mouth daily.    [provider]  carvedilol  (COREG ) 12.5 MG tablet Take 1.5 tablets (18.75 mg total) by mouth 2 (two) times daily with a meal. 01/30/24   Rolan,  Ezra RAMAN, MD  cyclobenzaprine  (FLEXERIL ) 5 MG tablet Take 1 tablet (5 mg total) by mouth 2 (two) times daily as needed for muscle spasms. Patient not taking: Reported on 01/21/2024 12/31/23   Gudena, Vinay, MD  DULoxetine  (CYMBALTA ) 60 MG capsule Take 60 mg by mouth daily. 12/17/22   [provider]  furosemide  (LASIX ) 40 MG tablet Take 40 mg by mouth as needed for fluid or edema.    [provider]  gabapentin  (NEURONTIN ) 300 MG capsule TAKE 2 CAPSULES BY MOUTH THREE TIMES DAILY 04/15/23   Odean Potts, MD  lidocaine  (LIDODERM ) 5 % Place 1 patch onto the skin daily. Remove  & Discard patch within 12 hours or as directed by MD Patient not taking: Reported on 01/21/2024 01/17/24   Armenta Canning, MD  losartan  (COZAAR ) 100 MG tablet Take 100 mg by mouth every morning.    [provider]  MAGNESIUM  PO Take 2 tablets by mouth daily.    [provider]  ondansetron  (ZOFRAN ) 8 MG tablet Take 1 tablet (8 mg total) by mouth every 8 (eight) hours as needed for nausea or vomiting. 01/21/24   Laurence Locus, DO  Oxycodone  HCl 10 MG TABS Take 10 mg by mouth every 6 (six) hours as needed. 01/24/24   [provider]  oxyCODONE -acetaminophen  (PERCOCET/ROXICET) 5-325 MG tablet Take 1-2 tablets by mouth every 6 (six) hours as needed for severe pain (pain score 7-10). Patient taking differently: Take 1 tablet by mouth daily as needed for severe pain (pain score 7-10) or moderate pain (pain score 4-6). 01/17/24   Armenta Canning, MD  pantoprazole  (PROTONIX ) 40 MG tablet Take 1 tablet (40 mg total) by mouth daily. 01/21/24 02/20/24  Laurence Locus, DO  POTASSIUM PO Take 1 tablet by mouth daily.    [provider]  predniSONE (DELTASONE) 5 MG tablet Take 5 mg by mouth daily as needed (inflammation).    [provider]  rosuvastatin  (CRESTOR ) 10 MG tablet Take 1 tablet (10 mg total) by mouth daily. 11/14/23 11/13/24  Rolan Ezra RAMAN, MD  spironolactone  (ALDACTONE ) 25 MG tablet Take 1 tablet daily 01/24/24   Rolan Ezra RAMAN, MD  temazepam  (RESTORIL ) 30 MG capsule Take 1 capsule (30 mg total) by mouth at bedtime. 01/28/24   Crawford Morna Pickle, NP      Allergies    Adhesive [tape] and Penicillins    Review of Systems   Review of Systems A 10 point review of systems was performed and is negative unless otherwise reported in HPI.  Physical Exam Updated Vital Signs BP 127/83 (BP Location: Right Arm)   Pulse 87   Temp 98.5 F (36.9 C) (Oral)   Resp 16   Ht 5' 3 (1.6 m)   Wt 102.1 kg   SpO2 98%   BMI 39.86 kg/m  Physical Exam General: Chronically  ill- appearing elderly female, lying in bed.  HEENT: PERRLA, Sclera anicteric, MMM, trachea midline.  Cardiology: RRR, no murmurs/rubs/gallops.Port in R upper chest without surrounding erythema/induration/fluctuance. S/p bilateral mastectomy. Resp: Normal respiratory rate and effort. CTAB, no wheezes, rhonchi, crackles.  Abd: Soft, non-tender, non-distended. No rebound tenderness or guarding.  GU: Deferred. MSK: L hip TTP, ROM decreased d/t pain Skin: warm, dry. No rashes or lesions. Back: No CVA tenderness Neuro: A&Ox4, CNs II-XII grossly intact. MAEs. Sensation grossly intact.  Psych: Normal mood and affect.   ED Results / Procedures / Treatments   Labs (all labs ordered are listed, but only abnormal results are displayed)  Labs Reviewed  COMPREHENSIVE METABOLIC PANEL WITH GFR - Abnormal; Notable for the following components:      Result Value   CO2 21 (*)    Glucose, Bld 102 (*)    Creatinine, Ser 1.52 (*)    Calcium  8.5 (*)    Albumin 2.5 (*)    AST 12 (*)    GFR, Estimated 36 (*)    All other components within normal limits  CBC WITH DIFFERENTIAL/PLATELET - Abnormal; Notable for the following components:   RBC 3.31 (*)    Hemoglobin 9.1 (*)    HCT 27.3 (*)    RDW 15.9 (*)    All other components within normal limits  URINALYSIS, W/ REFLEX TO CULTURE (INFECTION SUSPECTED) - Abnormal; Notable for the following components:   APPearance CLOUDY (*)    Hgb urine dipstick SMALL (*)    Nitrite POSITIVE (*)    Leukocytes,Ua LARGE (*)    Bacteria, UA MANY (*)    All other components within normal limits  PROTIME-INR - Abnormal; Notable for the following components:   Prothrombin Time 16.4 (*)    INR 1.3 (*)    All other components within normal limits  CULTURE, BLOOD (ROUTINE X 2)  CULTURE, BLOOD (ROUTINE X 2)  URINE CULTURE  SEDIMENTATION RATE  C-REACTIVE PROTEIN  BASIC METABOLIC PANEL WITH GFR  CBC  I-STAT CG4 LACTIC ACID, ED  I-STAT CG4 LACTIC ACID, ED    EKG EKG  Interpretation Date/Time:  Thursday February 13 2024 11:50:49 EDT Ventricular Rate:  92 PR Interval:  219 QRS Duration:  116 QT Interval:  356 QTC Calculation: 441 R Axis:   38  Text Interpretation: Sinus rhythm Prolonged PR interval Nonspecific intraventricular conduction delay Baseline wander in lead(s) I Confirmed by Franklyn Gills [phone]) on 02/13/2024 1:00:57 PM  Radiology DG Chest Port 1 View Result Date: 02/13/2024 CLINICAL DATA:  Sepsis EXAM: PORTABLE CHEST 1 VIEW COMPARISON:  Chest radiograph dated 01/19/2024 FINDINGS: Right chest wall port tip projects over the superior cavoatrial junction. Normal lung volumes. No focal consolidations. No pleural effusion or pneumothorax. The heart size and mediastinal contours are within normal limits. No acute osseous abnormality. Bilateral axillary surgical clips. IMPRESSION: No consolidative pneumonia. Electronically Signed   By: Limin  Xu M.D.   On: 02/13/2024 14:38   MR HIP LEFT W WO CONTRAST Result Date: 02/12/2024 EXAM DESCRIPTION: MR HIP LEFT W WO CONTRAST CLINICAL HISTORY: infected hip versus met cancer causing necrosis and erosion, please evaluate further COMPARISON: None Available. TECHNIQUE: MRI of the hip is performed according to our usual protocol with multiplanar multi sequence imaging. FINDINGS: There is moderate to severe marrow edema on either side of the left sacroiliac joint as well as small erosions and associated joint effusion. There is a moderate myositis to the left iliac as musculature. Fluid collection measuring close to 2 cm in greatest dimension consistent with intramuscular abscess. No significant degenerative change. There is moderate focal marrow edema to the mid coccyx. The marrow signal is otherwise unremarkable. Mild greater trochanteric bursal fluid on the left. No joint effusion. The tendons and musculature are otherwise unremarkable. IMPRESSION: Moderate to severe marrow edema on either side of the left sacroiliac joint as  well as joint effusion and small erosions. Septic arthritis/osteomyelitis should BE excluded. Active inflammatory arthritis would also be in the differential. There is a focal fluid collection measuring up to 2 cm within the left iliacus musculature and associated myositis. This is suspicious for a small abscess. Given this  finding, the adjacent sacroiliac joint findings are likely infectious in etiology. Moderate focal marrow edema to a mid coccygeal segment which is probably a mild fracture. A second site of infection should be excluded and this will need follow-up. Electronically signed by: Reyes Frees MD 02/12/2024 09:18 PM EDT RP Workstation: [id number]   MR Brain W Wo Contrast Result Date: 02/12/2024 CLINICAL DATA:  Metastatic disease evaluation EXAM: MRI HEAD WITHOUT AND WITH CONTRAST TECHNIQUE: Multiplanar, multiecho pulse sequences of the brain and surrounding structures were obtained without and with intravenous contrast. CONTRAST:  10mL GADAVIST  GADOBUTROL  1 MMOL/ML IV SOLN COMPARISON:  CT head 03/24/2021. FINDINGS: Brain: No acute infarction, hemorrhage, hydrocephalus, extra-axial collection or mass lesion. No pathologic enhancement. Vascular: Major arterial flow voids are maintained at the skull base. Skull and upper cervical spine: Normal marrow signal. Sinuses/Orbits: Clear sinuses.  No acute orbital findings. Other: No mastoid effusions. IMPRESSION: No evidence of acute intracranial abnormality or metastatic disease. Electronically Signed   By: Gilmore GORMAN Molt M.D.   On: 02/12/2024 19:50    Procedures Procedures    Medications Ordered in ED Medications  carvedilol  (COREG ) tablet 18.75 mg (has no administration in time range)  rosuvastatin  (CRESTOR ) tablet 10 mg (has no administration in time range)  ALPRAZolam  (XANAX ) tablet 1 mg (has no administration in time range)  temazepam  (RESTORIL ) capsule 30 mg (has no administration in time range)  pantoprazole  (PROTONIX ) EC tablet 40  mg (has no administration in time range)  acetaminophen  (TYLENOL ) tablet 650 mg (has no administration in time range)    Or  acetaminophen  (TYLENOL ) suppository 650 mg (has no administration in time range)  oxyCODONE  (Oxy IR/ROXICODONE ) immediate release tablet 10 mg (has no administration in time range)  HYDROmorphone  (DILAUDID ) injection 0.5-1 mg (has no administration in time range)  senna-docusate (Senokot-S) tablet 1 tablet (has no administration in time range)  ondansetron  (ZOFRAN ) tablet 4 mg (has no administration in time range)    Or  ondansetron  (ZOFRAN ) injection 4 mg (has no administration in time range)  albuterol  (PROVENTIL ) (2.5 MG/3ML) 0.083% nebulizer solution 2.5 mg (has no administration in time range)  ondansetron  (ZOFRAN ) injection 4 mg (4 mg Intravenous Given 02/13/24 1342)  HYDROmorphone  (DILAUDID ) injection 0.5 mg (0.5 mg Intravenous Given 02/13/24 1340)  LORazepam  (ATIVAN ) injection 0.5 mg (0.5 mg Intravenous Given 02/13/24 1342)    ED Course/ Medical Decision Making/ A&P                          Medical Decision Making Amount and/or Complexity of Data Reviewed Labs: ordered. Decision-making details documented in ED Course.  Risk Prescription drug management. Decision regarding hospitalization.    This patient presents to the ED for concern of left hip pain, this involves an extensive number of treatment options, and is a complaint that carries with it a high risk of complications and morbidity.  I considered the following differential and admission for this acute, potentially life threatening condition.  Overall patient is hemodynamically stable, nontoxic-appearing though chronically ill.  MDM:    Patient sent in for concern for left hip osteomyelitis versus septic arthritis.  Definitely does have myositis with an area of abscess.  Will discuss with orthopedics for plan.  Patient is reassuringly afebrile without any leukocytosis WBC 9.6.  Hemoglobin 9.1 not far  from her baseline.  INR 1.3 does take Eliquis , did take Eliquis  this morning.  Patient does endorse anxiety as well and will treat with small dose of Ativan .  Clinical Course as of 02/13/24 1809  Thu Feb 13, 2024  1323 D/w orthopedic surgery Dr. Kendal. Recommends medicine admit, ID consult, IR to access that space and biopsy it. Recommends transfer to [place]. Recommended holding off on antibiotics as long as patient is not septic.   No leukocytosis, afebrile now, HDS. Will consult to medicine. [HN]  1353 Discussed with Dr. Zella with hospitalist.  Does appear the patient had a positive urine culture yesterday for E. coli.  Will discuss with interventional radiology to see when they can take her for biopsy/arthrocentesis.  Need to treat the UTI as well so cannot hold off on antibiotics for a long period of time while awaiting samples. [HN]  1436 Lactic Acid, Venous: 0.5 neg [HN]  1602 D/w Dr. Jenna who is at [facility] long, but [facility] is going [facility]. States could put in request for fluid aspiration and definitely reachable.  [HN]    Clinical Course User Index [HN] Franklyn Sid SAILOR, MD    Labs: I Ordered, and personally interpreted labs.  The pertinent results include: Those listed above  Additional history obtained from chart review, family at bedside.    Cardiac Monitoring: The patient was maintained on a cardiac monitor.  I personally viewed and interpreted the cardiac monitored which showed an underlying rhythm of: Normal sinus rhythm  Reevaluation: After the interventions noted above, I reevaluated the patient and found that they have :improved  Social Determinants of Health:  lives with family  Disposition: Admitted to medicine with orthopedics following, plan for IR intervention  Co morbidities that complicate the patient evaluation  Past Medical History:  Diagnosis Date   Anemia    pt pt report   Anxiety    Breast cancer (HCC)    mets to liver and lung    Breast cancer metastasized to multiple sites (HCC) 02/26/2013   Cellulitis    CHF (congestive heart failure) (HCC)    History of chemotherapy 09/2004   taxotere/herceptin /carboplatin   Hypertension    Neuropathy    Radiation 07/31/2006   left upper chest   Radiation 06/17/2006-06/27/2006   6480 cGy bilat. chest wall   SVC syndrome    Thrombosis      Medicines Meds ordered this encounter  Medications   ondansetron  (ZOFRAN ) injection 4 mg   HYDROmorphone  (DILAUDID ) injection 0.5 mg   LORazepam  (ATIVAN ) injection 0.5 mg   carvedilol  (COREG ) tablet 18.75 mg   rosuvastatin  (CRESTOR ) tablet 10 mg   ALPRAZolam  (XANAX ) tablet 1 mg   temazepam  (RESTORIL ) capsule 30 mg   pantoprazole  (PROTONIX ) EC tablet 40 mg   OR Linked Order Group    acetaminophen  (TYLENOL ) tablet 650 mg    acetaminophen  (TYLENOL ) suppository 650 mg   oxyCODONE  (Oxy IR/ROXICODONE ) immediate release tablet 10 mg    Refill:  0   HYDROmorphone  (DILAUDID ) injection 0.5-1 mg   senna-docusate (Senokot-S) tablet 1 tablet   OR Linked Order Group    ondansetron  (ZOFRAN ) tablet 4 mg    ondansetron  (ZOFRAN ) injection 4 mg   albuterol  (PROVENTIL ) (2.5 MG/3ML) 0.083% nebulizer solution 2.5 mg   DISCONTD: cefTRIAXone  (ROCEPHIN ) 2 g in sodium chloride  0.9 % 100 mL IVPB    Antibiotic Indication::   Osteomyelitis    I have reviewed the patients home medicines and have made adjustments as needed  Problem List / ED Course: Problem List Items Addressed This Visit   None Visit Diagnoses       Left hip pain    -  Primary     Other acute osteomyelitis, other site [facility])         Acute cystitis without hematuria                       This note was created using dictation software, which may contain spelling or grammatical errors.    Franklyn Sid SAILOR, MD 02/14/24 [phone]

## 2024-02-13 NOTE — ED Notes (Signed)
 Report called to [employer], [facility]. JRP,RN

## 2024-02-13 NOTE — ED Notes (Signed)
 Pt was given a [place] sandwich and ginger ale to drink

## 2024-02-13 NOTE — ED Triage Notes (Signed)
 Pt presents after labs and imaging completed yesterday.  UA resulted a UTI and imaging showed osteomyelitis in her L hip.  Pt is only complaining of hip pain.  Pain score 6/10.    Last chemo x3 weeks ago.

## 2024-02-14 ENCOUNTER — Inpatient Hospital Stay (HOSPITAL_COMMUNITY)

## 2024-02-14 ENCOUNTER — Encounter (HOSPITAL_COMMUNITY): Payer: Self-pay | Admitting: Internal Medicine

## 2024-02-14 ENCOUNTER — Other Ambulatory Visit: Payer: Self-pay

## 2024-02-14 DIAGNOSIS — N390 Urinary tract infection, site not specified: Secondary | ICD-10-CM | POA: Diagnosis not present

## 2024-02-14 DIAGNOSIS — K3533 Acute appendicitis with perforation and localized peritonitis, with abscess: Secondary | ICD-10-CM | POA: Diagnosis not present

## 2024-02-14 DIAGNOSIS — M4628 Osteomyelitis of vertebra, sacral and sacrococcygeal region: Secondary | ICD-10-CM | POA: Diagnosis not present

## 2024-02-14 DIAGNOSIS — B9620 Unspecified Escherichia coli [E. coli] as the cause of diseases classified elsewhere: Secondary | ICD-10-CM

## 2024-02-14 DIAGNOSIS — M869 Osteomyelitis, unspecified: Secondary | ICD-10-CM | POA: Insufficient documentation

## 2024-02-14 DIAGNOSIS — M0088 Arthritis due to other bacteria, vertebrae: Secondary | ICD-10-CM | POA: Diagnosis not present

## 2024-02-14 DIAGNOSIS — M009 Pyogenic arthritis, unspecified: Secondary | ICD-10-CM

## 2024-02-14 DIAGNOSIS — M4658 Other infective spondylopathies, sacral and sacrococcygeal region: Secondary | ICD-10-CM | POA: Insufficient documentation

## 2024-02-14 LAB — URINE CULTURE: Culture: >=100000 — AB

## 2024-02-14 LAB — BLOOD CULTURE ID PANEL (REFLEXED) - BCID2

## 2024-02-14 LAB — BASIC METABOLIC PANEL WITH GFR
Anion gap: 10 (ref 5–15)
BUN: 10 mg/dL (ref 8–23)
CO2: 24 mmol/L (ref 22–32)
Calcium: 8.7 mg/dL — ABNORMAL LOW (ref 8.9–10.3)
Chloride: 107 mmol/L (ref 98–111)
Creatinine, Ser: 1.17 mg/dL — ABNORMAL HIGH (ref 0.44–1.00)
GFR, Estimated: 50 mL/min — ABNORMAL LOW (ref >60)
Glucose, Bld: 89 mg/dL (ref 70–99)
Potassium: 4 mmol/L (ref 3.5–5.1)
Sodium: 141 mmol/L (ref 135–145)

## 2024-02-14 LAB — CBC
HCT: 22.7 % — ABNORMAL LOW (ref 36.0–46.0)
Hemoglobin: 7.8 g/dL — ABNORMAL LOW (ref 12.0–15.0)
MCH: 27.2 pg (ref 26.0–34.0)
MCHC: 34.4 g/dL (ref 30.0–36.0)
MCV: 79.1 fL — ABNORMAL LOW (ref 80.0–100.0)
Platelets: 205 10*3/uL (ref 150–400)
RBC: 2.87 MIL/uL — ABNORMAL LOW (ref 3.87–5.11)
RDW: 15.8 % — ABNORMAL HIGH (ref 11.5–15.5)
WBC: 6.9 10*3/uL (ref 4.0–10.5)
nRBC: 0 % (ref 0.0–0.2)

## 2024-02-14 LAB — IRON AND TIBC
Iron: 59 ug/dL (ref 28–170)
Saturation Ratios: 29 % (ref 10.4–31.8)
TIBC: 204 ug/dL — ABNORMAL LOW (ref 250–450)
UIBC: 145 ug/dL

## 2024-02-14 LAB — RETICULOCYTES
Immature Retic Fract: 23.9 % — ABNORMAL HIGH (ref 2.3–15.9)
RBC.: 2.84 MIL/uL — ABNORMAL LOW (ref 3.87–5.11)
Retic Count, Absolute: 66.7 10*3/uL (ref 19.0–186.0)
Retic Ct Pct: 2.4 % (ref 0.4–3.1)

## 2024-02-14 LAB — FERRITIN: Ferritin: 167 ng/mL (ref 11–307)

## 2024-02-14 LAB — FOLATE: Folate: 6.7 ng/mL (ref >5.9)

## 2024-02-14 LAB — VITAMIN B12: Vitamin B-12: 690 pg/mL (ref 180–914)

## 2024-02-14 MED ORDER — DAPTOMYCIN-SODIUM CHLORIDE 700-0.9 MG/100ML-% IV SOLN
700.0000 mg | Freq: Every day | INTRAVENOUS | Status: DC
  Administered 2024-02-14 – 2024-02-17 (×4): 700 mg via INTRAVENOUS
  Filled 2024-02-14 (×4): qty 100

## 2024-02-14 MED ORDER — GABAPENTIN 300 MG PO CAPS
300.0000 mg | ORAL_CAPSULE | Freq: Three times a day (TID) | ORAL | Status: DC
  Administered 2024-02-14 – 2024-02-20 (×18): 300 mg via ORAL
  Filled 2024-02-14 (×18): qty 1

## 2024-02-14 MED ORDER — MIDAZOLAM HCL 2 MG/2ML IJ SOLN
INTRAMUSCULAR | Status: AC | PRN
  Administered 2024-02-14: 1 mg via INTRAVENOUS

## 2024-02-14 MED ORDER — SODIUM CHLORIDE 0.9% FLUSH
10.0000 mL | Freq: Two times a day (BID) | INTRAVENOUS | Status: DC
  Administered 2024-02-14 – 2024-02-20 (×9): 10 mL

## 2024-02-14 MED ORDER — SODIUM CHLORIDE 0.9 % IV SOLN
2.0000 g | Freq: Every day | INTRAVENOUS | Status: DC
  Administered 2024-02-14 – 2024-02-20 (×7): 2 g via INTRAVENOUS
  Filled 2024-02-14 (×7): qty 20

## 2024-02-14 MED ORDER — LIDOCAINE HCL (PF) 1 % IJ SOLN
10.0000 mL | Freq: Once | INTRAMUSCULAR | Status: AC
  Administered 2024-02-14: 10 mL

## 2024-02-14 MED ORDER — MIDAZOLAM HCL 2 MG/2ML IJ SOLN
INTRAMUSCULAR | Status: AC
  Filled 2024-02-14: qty 2

## 2024-02-14 MED ORDER — FENTANYL CITRATE (PF) 100 MCG/2ML IJ SOLN
INTRAMUSCULAR | Status: AC
Start: 2024-02-14 — End: 2024-02-14
  Filled 2024-02-14: qty 2

## 2024-02-14 MED ORDER — DULOXETINE HCL 60 MG PO CPEP
60.0000 mg | ORAL_CAPSULE | Freq: Every day | ORAL | Status: DC
  Administered 2024-02-14 – 2024-02-20 (×7): 60 mg via ORAL
  Filled 2024-02-14 (×7): qty 1

## 2024-02-14 MED ORDER — FENTANYL CITRATE (PF) 100 MCG/2ML IJ SOLN
INTRAMUSCULAR | Status: AC | PRN
  Administered 2024-02-14: 50 ug via INTRAVENOUS

## 2024-02-14 MED ORDER — CHLORHEXIDINE GLUCONATE CLOTH 2 % EX PADS
6.0000 | MEDICATED_PAD | Freq: Every day | CUTANEOUS | Status: DC
  Administered 2024-02-14 – 2024-02-20 (×7): 6 via TOPICAL

## 2024-02-14 MED ORDER — APIXABAN 5 MG PO TABS
5.0000 mg | ORAL_TABLET | Freq: Two times a day (BID) | ORAL | Status: DC
  Administered 2024-02-14 – 2024-02-20 (×12): 5 mg via ORAL
  Filled 2024-02-14 (×12): qty 1

## 2024-02-14 MED ORDER — SODIUM CHLORIDE 0.9% FLUSH
10.0000 mL | INTRAVENOUS | Status: DC | PRN
  Administered 2024-02-18 – 2024-02-20 (×2): 10 mL

## 2024-02-14 NOTE — Progress Notes (Signed)
 Pharmacy Antimicrobial Stewardship Note  80 YOF who presented on 6/19 with concern for L-SI joint septic arthritis/osteo with associated abscess. Antibiotics were held on admission pending IR aspirate - now done. Will initiate antibiotic therapy with Daptomcyin + Ceftriaxone .  Noted PCN allergy but has tolerated cephalosporins. CrCl>30 ml/min. Will check CK w/ AM labs  Plan - Start Daptomycin 700 mg IV every 24 hours - Start Rocephin  2g IV every 24 hours - Will follow-up on blood and IR aspirate cultures to narrow down for target organism  Thank you for allowing pharmacy to be a part of this patient's care.  Garland Junk, PharmD, BCPS, BCIDP Infectious Diseases Clinical Pharmacist 02/14/2024 12:05 PM   **Pharmacist phone directory can now be found on [email or web address] ([email or web address]).  Listed under [facility].

## 2024-02-14 NOTE — Plan of Care (Signed)
   Problem: Education: Goal: Knowledge of General Education information will improve Description: Including pain rating scale, medication(s)/side effects and non-pharmacologic comfort measures Outcome: Progressing   Problem: Pain Managment: Goal: General experience of comfort will improve and/or be controlled Outcome: Progressing   Problem: Safety: Goal: Ability to remain free from injury will improve Outcome: Progressing

## 2024-02-14 NOTE — Progress Notes (Signed)
 PHARMACY - PHYSICIAN COMMUNICATION CRITICAL VALUE ALERT - BLOOD CULTURE IDENTIFICATION (BCID)  [name] is an 73 y.o. female who presented to [facility] on 02/13/2024 with a chief complaint of Left SI joint septic arthritis  Assessment:  1/2 blood culture bottles with strep agalactiae.  Name of physician (or Provider) Contacted: Dr. Lorre Rosin  Current antibiotics: Rocephin  and Daptomycin  Changes to prescribed antibiotics recommended:  Current antibiotics cover results. May be able to narrow to Ancef. Noted pt with PCN allergy (hives). MD will defer to ID as to narrowing antibiotics.  Results for orders placed or performed during the hospital encounter of 02/13/24  Blood Culture ID Panel (Reflexed) (Collected: 02/13/2024  9:44 PM)  Result Value Ref Range   Enterococcus faecalis NOT DETECTED NOT DETECTED   Enterococcus Faecium NOT DETECTED NOT DETECTED   Listeria monocytogenes NOT DETECTED NOT DETECTED   Staphylococcus species NOT DETECTED NOT DETECTED   Staphylococcus aureus (BCID) NOT DETECTED NOT DETECTED   Staphylococcus epidermidis NOT DETECTED NOT DETECTED   Staphylococcus lugdunensis NOT DETECTED NOT DETECTED   Streptococcus species DETECTED (A) NOT DETECTED   Streptococcus agalactiae DETECTED (A) NOT DETECTED   Streptococcus pneumoniae NOT DETECTED NOT DETECTED   Streptococcus pyogenes NOT DETECTED NOT DETECTED   A.calcoaceticus-baumannii NOT DETECTED NOT DETECTED   Bacteroides fragilis NOT DETECTED NOT DETECTED   Enterobacterales NOT DETECTED NOT DETECTED   Enterobacter cloacae complex NOT DETECTED NOT DETECTED   Escherichia coli NOT DETECTED NOT DETECTED   Klebsiella aerogenes NOT DETECTED NOT DETECTED   Klebsiella oxytoca NOT DETECTED NOT DETECTED   Klebsiella pneumoniae NOT DETECTED NOT DETECTED   Proteus species NOT DETECTED NOT DETECTED   Salmonella species NOT DETECTED NOT DETECTED   Serratia marcescens NOT DETECTED NOT DETECTED   Haemophilus influenzae NOT  DETECTED NOT DETECTED   Neisseria meningitidis NOT DETECTED NOT DETECTED   Pseudomonas aeruginosa NOT DETECTED NOT DETECTED   Stenotrophomonas maltophilia NOT DETECTED NOT DETECTED   Candida albicans NOT DETECTED NOT DETECTED   Candida auris NOT DETECTED NOT DETECTED   Candida glabrata NOT DETECTED NOT DETECTED   Candida krusei NOT DETECTED NOT DETECTED   Candida parapsilosis NOT DETECTED NOT DETECTED   Candida tropicalis NOT DETECTED NOT DETECTED   Cryptococcus neoformans/gattii NOT DETECTED NOT DETECTED    Enrigue Harvard, PharmD, BCPS Please see amion for complete clinical pharmacist phone list 02/14/2024  7:34 PM

## 2024-02-14 NOTE — Plan of Care (Signed)
   Problem: Health Behavior/Discharge Planning: Goal: Ability to manage health-related needs will improve Outcome: Progressing   Problem: Clinical Measurements: Goal: Ability to maintain clinical measurements within normal limits will improve Outcome: Progressing   Problem: Clinical Measurements: Goal: Will remain free from infection Outcome: Progressing

## 2024-02-14 NOTE — Consult Note (Signed)
 Chief Complaint: Iliacus fluid collection  Referring Provider(s): Jannette Mend, Mir  Supervising Physician: Marland Silvas  Patient Status: [facility] - In-pt  History of Present Illness: [name] is a 73 y.o. female with medical issues including chronic diastolic congestive heart failure, anxiety, PE on Eliquis , and metastatic breast cancer on Herceptin .  She was admitted to the hospital with left SI joint osteomyelitis and associated abscess.    Per the chart, she has had pain in the left hip for a few weeks which has been worked up with CT scans.    Yesterday she underwent MRI of her pelvis and she was called by her provider to come to the ER due to concern for iliacus abscess and left-sided SI joint osteomyelitis.    Patient denies any recent fevers or chills, no dysuria, cough, no recent bacterial infection.  No nausea/vomiting. ROS negative.   We are asked to evaluate for aspiration.  She is NPO.    Patient is DNR - limited  Past Medical History:  Diagnosis Date   Anemia    pt pt report   Anxiety    Breast cancer (HCC)    mets to liver and lung   Breast cancer metastasized to multiple sites [facility]) 02/26/2013   Cellulitis    CHF (congestive heart failure) (HCC)    History of chemotherapy 09/2004   taxotere/herceptin /carboplatin   Hypertension    Neuropathy    Radiation 07/31/2006   left upper chest   Radiation 06/17/2006-06/27/2006   6480 cGy bilat. chest wall   SVC syndrome    Thrombosis     Past Surgical History:  Procedure Laterality Date   ANKLE SURGERY Left    BACK SURGERY     CHOLECYSTECTOMY  08/28/1987   ESOPHAGOGASTRODUODENOSCOPY Left 01/21/2024   Procedure: EGD (ESOPHAGOGASTRODUODENOSCOPY);  Surgeon: Evangeline Hilts, MD;  Location: [facility];  Service: Gastroenterology;  Laterality: Left;   MASS EXCISION Left 05/10/2022   Procedure: EXCISION OF LEFT CHEST WALL MASS;  Surgeon: Sim Dryer, MD;  Location: [facility] OR;  Service: General;   Laterality: Left;   MASTECTOMY Bilateral    w/ lymph node removal per patient   PERIPHERALLY INSERTED CENTRAL CATHETER INSERTION     PICC LINE INSERTION     PICC LINE REMOVAL ([facility] HX)     SURGICAL EXCISION OF EXCESSIVE SKIN     TUBAL LIGATION  08/27/1984    Allergies: Adhesive [tape] and Penicillins  Medications: Prior to Admission medications   Medication Sig Start Date End Date Taking? Authorizing Provider  acetaminophen  (TYLENOL ) 500 MG tablet Take 500-1,000 mg by mouth every 6 (six) hours as needed for mild pain (pain score 1-3), fever or moderate pain (pain score 4-6).    [provider]  albuterol  (VENTOLIN  HFA) 108 (90 Base) MCG/ACT inhaler Inhale 2 puffs into the lungs every 6 (six) hours as needed for wheezing. 06/28/22   Gudena, Vinay, MD  ALPRAZolam  (XANAX ) 1 MG tablet Take 1 tablet (1 mg total) by mouth 3 (three) times daily as needed for anxiety. 12/31/23   Gudena, Vinay, MD  amLODipine  (NORVASC ) 10 MG tablet Take 1 tablet (10 mg total) by mouth every morning. 09/10/19   Magrinat, Rozella Cornfield, MD  apixaban  (ELIQUIS ) 5 MG TABS tablet Take 1 tablet (5 mg total) by mouth 2 (two) times daily. 08/06/23   Gudena, Vinay, MD  baclofen  (LIORESAL ) 10 MG tablet TAKE 1 TABLET BY MOUTH THREE TIMES DAILY AS NEEDED FOR MUSCLE SPASMS 05/19/21   Magrinat, Woodroe Hazel  C, MD  CALCIUM  PO Take 2 tablets by mouth daily.    [provider]  carvedilol  (COREG ) 12.5 MG tablet Take 1.5 tablets (18.75 mg total) by mouth 2 (two) times daily with a meal. 01/30/24   Darlis Eisenmenger, MD  cyclobenzaprine  (FLEXERIL ) 5 MG tablet Take 1 tablet (5 mg total) by mouth 2 (two) times daily as needed for muscle spasms. Patient not taking: Reported on 01/21/2024 12/31/23   Gudena, Vinay, MD  DULoxetine (CYMBALTA) 60 MG capsule Take 60 mg by mouth daily. 12/17/22   [provider]  furosemide  (LASIX ) 40 MG tablet Take 40 mg by mouth as needed for fluid or edema.    [provider]  gabapentin   (NEURONTIN ) 300 MG capsule TAKE 2 CAPSULES BY MOUTH THREE TIMES DAILY 04/15/23   Cameron Cea, MD  lidocaine  (LIDODERM ) 5 % Place 1 patch onto the skin daily. Remove & Discard patch within 12 hours or as directed by MD Patient not taking: Reported on 01/21/2024 01/17/24   Wynetta Heckle, MD  losartan  (COZAAR ) 100 MG tablet Take 100 mg by mouth every morning.    [provider]  MAGNESIUM  PO Take 2 tablets by mouth daily.    [provider]  ondansetron  (ZOFRAN ) 8 MG tablet Take 1 tablet (8 mg total) by mouth every 8 (eight) hours as needed for nausea or vomiting. 01/21/24   Unk Garb, DO  Oxycodone  HCl 10 MG TABS Take 10 mg by mouth every 6 (six) hours as needed. 01/24/24   [provider]  oxyCODONE -acetaminophen  (PERCOCET/ROXICET) 5-325 MG tablet Take 1-2 tablets by mouth every 6 (six) hours as needed for severe pain (pain score 7-10). Patient taking differently: Take 1 tablet by mouth daily as needed for severe pain (pain score 7-10) or moderate pain (pain score 4-6). 01/17/24   Wynetta Heckle, MD  pantoprazole  (PROTONIX ) 40 MG tablet Take 1 tablet (40 mg total) by mouth daily. 01/21/24 02/20/24  Unk Garb, DO  POTASSIUM PO Take 1 tablet by mouth daily.    [provider]  predniSONE (DELTASONE) 5 MG tablet Take 5 mg by mouth daily as needed (inflammation).    [provider]  rosuvastatin  (CRESTOR ) 10 MG tablet Take 1 tablet (10 mg total) by mouth daily. 11/14/23 11/13/24  Darlis Eisenmenger, MD  spironolactone  (ALDACTONE ) 25 MG tablet Take 1 tablet daily 01/24/24   McLean, Dalton S, MD  temazepam  (RESTORIL ) 30 MG capsule Take 1 capsule (30 mg total) by mouth at bedtime. 01/28/24   Percival Brace, NP     Family History  Problem Relation Age of Onset   Heart failure Father    Cancer Father        Prostate cancer   Heart failure Brother    Cancer Brother        Prostate cancer   Diabetes Maternal Aunt     Social History   Socioeconomic  History   Marital status: Married    Spouse name: Not on file   Number of children: Not on file   Years of education: Not on file   Highest education level: Not on file  Occupational History   Not on file  Tobacco Use   Smoking status: Never    Passive exposure: Never   Smokeless tobacco: Never  Vaping Use   Vaping status: Never Used  Substance and Sexual Activity   Alcohol  use: Yes    Comment: occasional- once a month   Drug use: No  Sexual activity: Yes    Birth control/protection: Post-menopausal  Other Topics Concern   Not on file  Social History Narrative   Not on file   Social Drivers of Health   Financial Resource Strain: Not on file  Food Insecurity: No Food Insecurity (02/14/2024)   Hunger Vital Sign    Worried About Running Out of Food in the Last Year: Never true    Ran Out of Food in the Last Year: Never true  Transportation Needs: No Transportation Needs (02/14/2024)   PRAPARE - Administrator, Civil Service (Medical): No    Lack of Transportation (Non-Medical): No  Physical Activity: Not on file  Stress: Not on file  Social Connections: Socially Isolated (02/14/2024)   Social Connection and Isolation Panel    Frequency of Communication with Friends and Family: Never    Frequency of Social Gatherings with Friends and Family: Never    Attends Religious Services: Never    Database administrator or Organizations: No    Attends Engineer, structural: Not on file    Marital Status: Married     Review of Systems: A 12 point ROS discussed and pertinent positives are indicated in the HPI above.  All other systems are negative.    Vital Signs: BP (!) 114/51 (BP Location: Right Arm)   Pulse 78   Temp 98.9 F (37.2 C) (Oral)   Resp 16   Ht 5' 3 (1.6 m)   Wt 225 lb (102.1 kg)   SpO2 96%   BMI 39.86 kg/m   Advance Care Plan: The advanced care place/surrogate decision maker was discussed at the time of visit and the patient did not  wish to discuss or was not able to name a surrogate decision maker or provide an advance care plan.  Physical Exam Vitals reviewed.  Constitutional:      Appearance: Normal appearance.  HENT:     Head: Normocephalic and atraumatic.   Eyes:     Extraocular Movements: Extraocular movements intact.    Cardiovascular:     Rate and Rhythm: Normal rate and regular rhythm.  Pulmonary:     Effort: Pulmonary effort is normal. No respiratory distress.     Breath sounds: Normal breath sounds.  Chest:     Comments: Port right chest currently accessed Abdominal:     Palpations: Abdomen is soft.   Musculoskeletal:        General: Normal range of motion.     Cervical back: Normal range of motion.   Skin:    General: Skin is warm and dry.   Neurological:     General: No focal deficit present.     Mental Status: She is alert and oriented to person, place, and time.   Psychiatric:        Mood and Affect: Mood normal.        Behavior: Behavior normal.        Thought Content: Thought content normal.        Judgment: Judgment normal.     Imaging: DG Chest Port 1 View Result Date: 02/13/2024 CLINICAL DATA:  Sepsis EXAM: PORTABLE CHEST 1 VIEW COMPARISON:  Chest radiograph dated 01/19/2024 FINDINGS: Right chest wall port tip projects over the superior cavoatrial junction. Normal lung volumes. No focal consolidations. No pleural effusion or pneumothorax. The heart size and mediastinal contours are within normal limits. No acute osseous abnormality. Bilateral axillary surgical clips. IMPRESSION: No consolidative pneumonia. Electronically Signed   By: Limin  Xu M.D.   On: 02/13/2024 14:38   MR HIP LEFT W WO CONTRAST Result Date: 02/12/2024 EXAM DESCRIPTION: MR HIP LEFT W WO CONTRAST CLINICAL HISTORY: infected hip versus met cancer causing necrosis and erosion, please evaluate further COMPARISON: None Available. TECHNIQUE: MRI of the hip is performed according to our usual protocol with  multiplanar multi sequence imaging. FINDINGS: There is moderate to severe marrow edema on either side of the left sacroiliac joint as well as small erosions and associated joint effusion. There is a moderate myositis to the left iliac as musculature. Fluid collection measuring close to 2 cm in greatest dimension consistent with intramuscular abscess. No significant degenerative change. There is moderate focal marrow edema to the mid coccyx. The marrow signal is otherwise unremarkable. Mild greater trochanteric bursal fluid on the left. No joint effusion. The tendons and musculature are otherwise unremarkable. IMPRESSION: Moderate to severe marrow edema on either side of the left sacroiliac joint as well as joint effusion and small erosions. Septic arthritis/osteomyelitis should BE excluded. Active inflammatory arthritis would also be in the differential. There is a focal fluid collection measuring up to 2 cm within the left iliacus musculature and associated myositis. This is suspicious for a small abscess. Given this finding, the adjacent sacroiliac joint findings are likely infectious in etiology. Moderate focal marrow edema to a mid coccygeal segment which is probably a mild fracture. A second site of infection should be excluded and this will need follow-up. Electronically signed by: Basilio Both MD 02/12/2024 09:18 PM EDT RP Workstation: [id number]   MR Brain W Wo Contrast Result Date: 02/12/2024 CLINICAL DATA:  Metastatic disease evaluation EXAM: MRI HEAD WITHOUT AND WITH CONTRAST TECHNIQUE: Multiplanar, multiecho pulse sequences of the brain and surrounding structures were obtained without and with intravenous contrast. CONTRAST:  10mL GADAVIST GADOBUTROL 1 MMOL/ML IV SOLN COMPARISON:  CT head 03/24/2021. FINDINGS: Brain: No acute infarction, hemorrhage, hydrocephalus, extra-axial collection or mass lesion. No pathologic enhancement. Vascular: Major arterial flow voids are maintained at the skull base.  Skull and upper cervical spine: Normal marrow signal. Sinuses/Orbits: Clear sinuses.  No acute orbital findings. Other: No mastoid effusions. IMPRESSION: No evidence of acute intracranial abnormality or metastatic disease. Electronically Signed   By: Stevenson Elbe M.D.   On: 02/12/2024 19:50   DG Chest Port 1 View Result Date: 01/19/2024 CLINICAL DATA:  811914 Epigastric abdominal pain [id number] EXAM: PORTABLE CHEST - 1 VIEW COMPARISON:  06/16/2014 FINDINGS: Lungs are clear. Stable right IJ port catheter to the cavoatrial junction. Heart size and mediastinal contours are within normal limits. Surgical clips bilateral axillae. No effusion. Left shoulder DJD. IMPRESSION: No acute cardiopulmonary disease. Electronically Signed   By: Nicoletta Barrier M.D.   On: 01/19/2024 16:11   CT Hip Left Wo Contrast Result Date: 01/17/2024 CLINICAL DATA:  Hip pain, stress fracture suspected, negative x-ray EXAM: CT OF THE LEFT HIP WITHOUT CONTRAST TECHNIQUE: Multidetector CT imaging of the left hip was performed according to the standard protocol. Multiplanar CT image reconstructions were also generated. RADIATION DOSE REDUCTION: This exam was performed according to the departmental dose-optimization program which includes automated exposure control, adjustment of the mA and/or kV according to patient size and/or use of iterative reconstruction technique. COMPARISON:  Radiograph 12/26/2023 and CT 11/14/2023 FINDINGS: Bones/Joint/Cartilage No acute fracture or dislocation of the left hip. Loss of cortical distinction and erosive change about the left SI joint extending into the left sacral ala and left iliac wing. This is new compared to 11/14/2023. There is  associated thickening of the left iliacus muscle. No discrete fluid collection or abscess though evaluation is limited without IV contrast. Ligaments Suboptimally assessed by CT. Muscles and Tendons Thickening of the left iliacus muscle adjacent to the erosive change in the left  SI joint without defined fluid collection. Soft tissues Unremarkable. IMPRESSION: Loss of cortical definition and erosions about the left SI joint. Associated thickening/edema of the left iliacus muscle. These findings are new compared to 11/14/2023. Primary differential considerations are septic sacroiliitis with myositis of the iliacus muscle or infiltrative mass/metastasis in the left SI joint and hematoma in the left iliacus. Electronically Signed   By: Rozell Cornet M.D.   On: 01/17/2024 22:22    Labs:  CBC: Recent Labs    01/28/24 1122 02/12/24 1153 02/13/24 1148 02/14/24 0627  WBC 12.4* 10.3 9.6 6.9  HGB 10.1* 9.6* 9.1* 7.8*  HCT 28.6* 27.3* 27.3* 22.7*  PLT 261 260 234 205    COAGS: Recent Labs    01/19/24 1529 02/13/24 1312  INR 1.6* 1.3*    BMP: Recent Labs    01/28/24 1122 02/12/24 1153 02/13/24 1148 02/14/24 0627  NA 133* 137 135 141  K 3.4* 4.1 3.8 4.0  CL 96* 102 103 107  CO2 26 26 21* 24  GLUCOSE 123* 110* 102* 89  BUN 22 13 13 10   CALCIUM  8.5* 8.8* 8.5* 8.7*  CREATININE 1.54* 2.02* 1.52* 1.17*  GFRNONAA 36* 26* 36* 50*    LIVER FUNCTION TESTS: Recent Labs    01/20/24 0513 01/28/24 1122 02/12/24 1153 02/13/24 1148  BILITOT 1.6* 0.6 0.7 1.2  AST 14* 11* 11* 12*  ALT 9 5 5 8   ALKPHOS 66 77 83 75  PROT 8.1 7.9 7.5 7.2  ALBUMIN 2.7* 3.1* 3.0* 2.5*    TUMOR MARKERS: No results for input(s): AFPTM, CEA, CA199, CHROMGRNA in the last 8760 hours.  Assessment and Plan:  Iliacus fluid collection - images reviewed by Dr. Marlena Sima.  Will proceed with image guided aspiration today.  Risks and benefits of aspiration were discussed with the patient including bleeding, infection, damage to adjacent structures, and sepsis.  All of the patient's questions were answered, patient is agreeable to proceed. Consent signed and in IR   Electronically Signed: Connor Deiters, PA-C   02/14/2024, 9:25 AM      I spent a total of 40 Minutes  in  face to face in clinical consultation, greater than 50% of which was counseling/coordinating care for Iliacus aspiration.

## 2024-02-14 NOTE — Plan of Care (Signed)
  Problem: Pain Managment: Goal: General experience of comfort will improve and/or be controlled Outcome: Progressing   Problem: Safety: Goal: Ability to remain free from injury will improve Outcome: Progressing
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
## 2024-02-14 NOTE — TOC Initial Note (Signed)
 Transition of Care (TOC) - Initial/Assessment Note   Spoke to patient, daughter and spouse at bedside.   PAtient from home. Prior to admission active with [facility] for home health. Patient and family want to continue services.  Notified Cheryl with [facility] of patient's admission. She will need new orders and face to face to continue services.   Patient has rolling walker, rollator, cane, wheelchair tub transfer bench and grab bars at home already     Transition of Care Department [facility]) has reviewed patient and no TOC needs have been identified at this time. We will continue to monitor patient advancement through interdisciplinary progression rounds. If new patient transition needs arise, please place a TOC consult.   Patient Details  Name: [name] MRN: [id number] Date of Birth: 07/23/1951  Transition of Care [facility]) CM/SW Contact:    Terre Ferri, RN Phone Number: 02/14/2024, 12:48 PM  Clinical Narrative:                   Expected Discharge Plan: Home w Home Health Services Barriers to Discharge: Continued Medical Work up   Patient Goals and CMS Choice Patient states their goals for this hospitalization and ongoing recovery are:: to return to home CMS Medicare.gov Compare Post Acute Care list provided to:: Patient Choice offered to / list presented to : Patient      Expected Discharge Plan and Services   Discharge Planning Services: CM Consult Post Acute Care Choice: Home Health Living arrangements for the past 2 months: Single Family Home                 DME Arranged: N/A DME Agency: NA       HH Arranged: PT HH Agency: [facility] Date HH Agency Contacted: 02/14/24 Time HH Agency Contacted: 1246 Representative spoke with at [facility] Agency: left Bartholomew Light a message awaiting call back  Prior Living Arrangements/Services Living arrangements for the past 2 months: Single Family Home Lives with:: Spouse Patient language and need for  interpreter reviewed:: Yes Do you feel safe going back to the place where you live?: Yes      Need for Family Participation in Patient Care: Yes (Comment) Care giver support system in place?: Yes (comment) Current home services: DME, Home PT Criminal Activity/Legal Involvement Pertinent to Current Situation/Hospitalization: No - Comment as needed  Activities of Daily Living   ADL Screening (condition at time of admission) Independently performs ADLs?: Yes (appropriate for developmental age) Is the patient deaf or have difficulty hearing?: No Does the patient have difficulty seeing, even when wearing glasses/contacts?: No Does the patient have difficulty concentrating, remembering, or making decisions?: No  Permission Sought/Granted   Permission granted to share information with : Yes, Verbal Permission Granted  Share Information with NAME: daughter Janeece Mechanic and Sherece Gambrill spouse  Permission granted to share info w AGENCY: [facility]        Emotional Assessment Appearance:: Appears stated age Attitude/Demeanor/Rapport: Engaged Affect (typically observed): Appropriate Orientation: : Oriented to Self, Oriented to Place, Oriented to  Time, Oriented to Situation Alcohol  / Substance Use: Not Applicable Psych Involvement: No (comment)  Admission diagnosis:  Left hip pain [M25.552] Acute cystitis without hematuria [N30.00] Abscess of left iliac fossa [K35.33] Other acute osteomyelitis, other site [facility]) [M86.18] Patient Active Problem List   Diagnosis Date Noted   Abscess of left iliac fossa 02/13/2024   Hypomagnesemia 01/20/2024   Hematemesis 01/19/2024   History of pulmonary embolism 01/19/2024   Chronic anticoagulation 01/19/2024  DNR (do not resuscitate).DNI(Do Not Intubate) 01/19/2024   Hypokalemia 01/19/2024   Acute metabolic acidosis 01/19/2024   Goals of care, counseling/discussion 08/13/2019   Port-A-Cath in place 08/14/2018   Obesity, Class III, BMI 40-49.9  (morbid obesity) 09/22/2015   Malignant neoplasm of both breasts in female, estrogen receptor negative (HCC) 09/22/2015   Coagulopathy (HCC) 09/22/2015   Peripheral neuropathy 09/22/2015   Hot flashes 03/24/2015   Right shoulder pain 06/12/2013   Neck pain 06/12/2013   Anemia of chronic disease 03/21/2013   Breast cancer metastasized to multiple sites [facility]) 02/26/2013   CHF (congestive heart failure) (HCC) 09/11/2012   Neuropathy 09/05/2012   Essential hypertension 09/05/2012   Anxiety 09/05/2012   PCP:  Olan Bering, MD Pharmacy:   [facility] [phone] [place], [place] - [address] AT [facility] [address] [place] [address] Phone: [phone] Fax: [phone]     Social Drivers of Health (SDOH) Social History: SDOH Screenings   Food Insecurity: No Food Insecurity (02/14/2024)  Housing: Low Risk  (02/14/2024)  Transportation Needs: No Transportation Needs (02/14/2024)  Utilities: Not At Risk (02/14/2024)  Social Connections: Moderately Isolated (02/14/2024)  Tobacco Use: Low Risk  (02/14/2024)   SDOH Interventions:     Readmission Risk Interventions    01/21/2024    1:47 PM  Readmission Risk Prevention Plan  Transportation Screening Complete  PCP or Specialist Appt within 3-5 Days Complete  HRI or Home Care Consult Complete  Social Work Consult for Recovery Care Planning/Counseling Complete  Palliative Care Screening Not Applicable  Medication Review [name]) Complete

## 2024-02-14 NOTE — Progress Notes (Signed)
 PROGRESS NOTE  [name] QQV:[id number] DOB: 12-01-1950   PCP: Olan Bering, MD  Patient is from: Home.  DOA: 02/13/2024 LOS: 1  Chief complaints Chief Complaint  Patient presents with   CA Pt   Abnormal Labs   Hip Pain     Brief Narrative / Interim history: 73 year old F with PMH of breast cancer, diastolic CHF, PE on Eliquis , HTN and anxiety presenting with waxing and waning left hip pain for about 6 weeks that has acutely gotten worse, and admitted with possible SI joint osteomyelitis with possible abscess.  Patient had outpatient MRI of her pelvis the day prior to admission, and she was called by her provider to come to the ER due to concern for iliacus abscess and left-sided SI joint osteomyelitis.  Patient denies any recent fevers or chills, no dysuria, cough, no recent bacterial infection.  Apparently yesterday she was not acting herself and so her oncology provider was concerned about a UTI.  Urine culture with E. coli.   Orthopedic surgery, ID and IR consulted.  Ortho recommended medical treatment.  She underwent CT aspiration of left iliacus process by IR.  Started on daptomycin and ceftriaxone  by ID.  Subjective: Seen and examined earlier this afternoon.  No major events this morning.  She had CT-guided aspiration of left iliacus process this morning.  Reports some pain in left hip.  No other complaints.  Objective: Vitals:   02/14/24 1130 02/14/24 1135 02/14/24 1150 02/14/24 1230  BP: 111/66 117/67 115/71 116/61  Pulse: 76 75 78 72  Resp: 10 13 17 16   Temp:    98.2 F (36.8 C)  TempSrc:    Oral  SpO2: 99% 100% 99% 98%  Weight:      Height:        Examination:  GENERAL: No apparent distress.  Nontoxic. HEENT: MMM.  Vision and hearing grossly intact.  NECK: Supple.  No apparent JVD.  RESP:  No IWOB.  Fair aeration bilaterally. CVS:  RRR. Heart sounds normal.  ABD/GI/GU: BS+. Abd soft, NTND.  MSK/EXT:  Moves extremities. No apparent deformity.  1+  BLE edema SKIN: no apparent skin lesion or wound NEURO: AA.  Oriented appropriately.  No apparent focal neuro deficit. PSYCH: Calm. Normal affect.   Consultants:  Infectious disease Orthopedic surgery Interventional radiology  Procedures: Aspiration of left iliacus process by IR  Microbiology summarized: Blood cultures NGTD Aspiration culture pending Urine culture pending  Assessment and plan: SI joint septic arthritis/osteomyelitis, iliacus abscess-patient presents with waxing or waning left hip pain for about 6 weeks that has acutely gotten worse.  MRI on 6/18 with moderate to severe marrow edema on either side of the left sacroiliac joint as well as joint effusion and small erosion concerning for septic arthritis/osteomyelitis.  MRI also showed 2 cm left iliacus fluid collection concerning for abscess.  Blood cultures NGTD.  CRP 2.1.  No fever or leukocytosis.  Lactic acid negative. -S/p IR aspiration of left iliacus abscess -On daptomycin and ceftriaxone  per ID -No plan for surgical intervention from Ortho standpoint.  WBAT on LLE. -Pain control with bowel regimen -Follow-up aspiration culture  -Follow MRI lumbar spine -PT/OT  Chronic diastolic CHF: TTE on 3/18 with LVEF of 55 to 60%.  Appears euvolemic on exam except for BLE edema.  Takes Lasix  as needed for edema.  Normotensive. -Continue holding home Lasix , Aldactone  and losartan  -Continue home Coreg   Microcytic anemia: Slight drop in Hgb.  No overt bleeding Recent Labs    01/19/24  1529 01/20/24 0513 01/21/24 0210 01/21/24 1154 01/28/24 1122 02/12/24 1153 02/13/24 1148 02/14/24 0627  HGB 10.8* 11.0* 10.0* 9.9* 10.1* 9.6* 9.1* 7.8*  - Check anemia panel - Continue monitoring   Metastatic breast cancer s/p mastectomy -Outpatient follow-up   E. coli UTI: Urine culture on 6/18 with E. Coli.  Sensitive to ceftriaxone  -Antibiotics as above   History of PE - Resume Eliquis    GERD-Protonix     Hyperlipidemia-Crestor   At risk for polypharmacy: On multiple sedating medications. -Will review meds with patient  Class III obesity Body mass index is 39.86 kg/m. - Encourage lifestyle change to lose weight          DVT prophylaxis:  SCDs Start: 02/13/24 1418  Code Status: DNR Family Communication: Updated patient's son and grandson earlier this morning Level of care: Med-Surg Status is: Inpatient Remains inpatient appropriate because: Septic arthritis/osteomyelitis   Final disposition: Likely home once medically stable   55 minutes with more than 50% spent in reviewing records, counseling patient/family and coordinating care.   Sch Meds:  Scheduled Meds:  carvedilol   18.75 mg Oral BID WC   Chlorhexidine  Gluconate Cloth  6 each Topical Daily   pantoprazole   40 mg Oral Daily   rosuvastatin   10 mg Oral Daily   sodium chloride  flush  10-40 mL Intracatheter Q12H   temazepam   30 mg Oral QHS   Continuous Infusions:  cefTRIAXone  (ROCEPHIN )  IV     DAPTOmycin 700 mg (02/14/24 1317)   PRN Meds:.acetaminophen  **OR** acetaminophen , albuterol , ALPRAZolam , HYDROmorphone (DILAUDID) injection, ondansetron  **OR** ondansetron  (ZOFRAN ) IV, oxyCODONE , senna-docusate, sodium chloride  flush  Antimicrobials: Anti-infectives (From admission, onward)    Start     Dose/Rate Route Frequency Ordered Stop   02/14/24 1400  cefTRIAXone  (ROCEPHIN ) 2 g in sodium chloride  0.9 % 100 mL IVPB        2 g 200 mL/hr over 30 Minutes Intravenous Daily 02/14/24 1207     02/14/24 1300  DAPTOmycin (CUBICIN) IVPB 700 mg/123mL premix        700 mg 200 mL/hr over 30 Minutes Intravenous Daily 02/14/24 1207     02/13/24 1445  cefTRIAXone  (ROCEPHIN ) 2 g in sodium chloride  0.9 % 100 mL IVPB  Status:  Discontinued        2 g 200 mL/hr over 30 Minutes Intravenous Every 24 hours 02/13/24 1430 02/13/24 1438        I have personally reviewed the following labs and images: CBC: Recent Labs  Lab  02/12/24 1153 02/13/24 1148 02/14/24 0627  WBC 10.3 9.6 6.9  NEUTROABS 5.6 5.2  --   HGB 9.6* 9.1* 7.8*  HCT 27.3* 27.3* 22.7*  MCV 78.2* 82.5 79.1*  PLT 260 234 205   BMP &GFR Recent Labs  Lab 02/12/24 1153 02/13/24 1148 02/14/24 0627  NA 137 135 141  K 4.1 3.8 4.0  CL 102 103 107  CO2 26 21* 24  GLUCOSE 110* 102* 89  BUN 13 13 10   CREATININE 2.02* 1.52* 1.17*  CALCIUM  8.8* 8.5* 8.7*  MG 1.4*  --   --    Estimated Creatinine Clearance: 49.6 mL/min (A) (by C-G formula based on SCr of 1.17 mg/dL (H)). Liver & Pancreas: Recent Labs  Lab 02/12/24 1153 02/13/24 1148  AST 11* 12*  ALT 5 8  ALKPHOS 83 75  BILITOT 0.7 1.2  PROT 7.5 7.2  ALBUMIN 3.0* 2.5*   No results for input(s): LIPASE, AMYLASE in the last 168 hours. No results for input(s): AMMONIA in the  last 168 hours. Diabetic: No results for input(s): HGBA1C in the last 72 hours. No results for input(s): GLUCAP in the last 168 hours. Cardiac Enzymes: No results for input(s): CKTOTAL, CKMB, CKMBINDEX, TROPONINI in the last 168 hours. No results for input(s): PROBNP in the last 8760 hours. Coagulation Profile: Recent Labs  Lab 02/13/24 1312  INR 1.3*   Thyroid Function Tests: No results for input(s): TSH, T4TOTAL, FREET4, T3FREE, THYROIDAB in the last 72 hours. Lipid Profile: No results for input(s): CHOL, HDL, LDLCALC, TRIG, CHOLHDL, LDLDIRECT in the last 72 hours. Anemia Panel: Recent Labs    02/12/24 1115 02/12/24 1129  VITAMINB12  --  797  FERRITIN 324*  --   TIBC  --  227*  IRON  --  99   Urine analysis:    Component Value Date/Time   COLORURINE YELLOW 02/13/2024 1517   APPEARANCEUR CLOUDY (A) 02/13/2024 1517   LABSPEC 1.012 02/13/2024 1517   LABSPEC 1.020 10/28/2014 1142   PHURINE 5.0 02/13/2024 1517   GLUCOSEU NEGATIVE 02/13/2024 1517   GLUCOSEU Negative 10/28/2014 1142   HGBUR SMALL (A) 02/13/2024 1517   BILIRUBINUR NEGATIVE 02/13/2024 1517    BILIRUBINUR Negative 10/28/2014 1142   KETONESUR NEGATIVE 02/13/2024 1517   PROTEINUR NEGATIVE 02/13/2024 1517   UROBILINOGEN 0.2 10/28/2014 1142   NITRITE POSITIVE (A) 02/13/2024 1517   LEUKOCYTESUR LARGE (A) 02/13/2024 1517   LEUKOCYTESUR Trace 10/28/2014 1142   Sepsis Labs: Invalid input(s): PROCALCITONIN, LACTICIDVEN  Microbiology: Recent Results (from the past 240 hours)  Culture, Urine     Status: Abnormal   Collection Time: 02/12/24 11:30 AM   Specimen: Urine, Clean Catch  Result Value Ref Range Status   Specimen Description   Final    URINE, CLEAN CATCH Performed at [facility], [address], [place], [place] [address]    Special Requests   Final    NONE Performed at [facility], [address], [place], [place] [address]    Culture >=100,000 COLONIES/mL ESCHERICHIA COLI (A)  Final   Report Status 02/14/2024 FINAL  Final   Organism ID, Bacteria ESCHERICHIA COLI (A)  Final      Susceptibility   Escherichia coli - MIC*    AMPICILLIN >=32 RESISTANT Resistant     CEFAZOLIN >=64 RESISTANT Resistant     CEFEPIME  <=0.12 SENSITIVE Sensitive     CEFTRIAXONE  1 SENSITIVE Sensitive     CIPROFLOXACIN <=0.25 SENSITIVE Sensitive     GENTAMICIN <=1 SENSITIVE Sensitive     IMIPENEM <=0.25 SENSITIVE Sensitive     NITROFURANTOIN <=16 SENSITIVE Sensitive     TRIMETH/SULFA <=20 SENSITIVE Sensitive     AMPICILLIN/SULBACTAM >=32 RESISTANT Resistant     PIP/TAZO 8 SENSITIVE Sensitive ug/mL    * >=100,000 COLONIES/mL ESCHERICHIA COLI  Blood Culture (routine x 2)     Status: None (Preliminary result)   Collection Time: 02/13/24 12:48 PM   Specimen: BLOOD  Result Value Ref Range Status   Specimen Description   Final    BLOOD PORTA CATH Performed at [facility], [address], [place], [place] [address]    Special Requests   Final    BOTTLES DRAWN AEROBIC AND ANAEROBIC Blood Culture results may not be  optimal due to an inadequate volume of blood received in culture bottles Performed at [facility], [address] [place], [place] [address]    Culture   Final    NO GROWTH < 24 HOURS Performed at [facility], [address], [place], [place] [address]    Report Status PENDING  Incomplete  Blood Culture (routine x 2)     Status: None (Preliminary result)   Collection Time: 02/13/24  9:44 PM   Specimen: BLOOD RIGHT ARM  Result Value Ref Range Status   Specimen Description BLOOD RIGHT ARM  Final   Special Requests   Final    BOTTLES DRAWN AEROBIC AND ANAEROBIC Blood Culture results may not be optimal due to an inadequate volume of blood received in culture bottles   Culture   Final    NO GROWTH < 12 HOURS Performed at [facility], [address] [place], [place] [address]    Report Status PENDING  Incomplete    Radiology Studies: No results found.    Verdun Rackley T. Marabeth Melland [facility]  If 7PM-7AM, please contact night-coverage [email or web address] 02/14/2024, 1:47 PM

## 2024-02-14 NOTE — Consult Note (Signed)
 Reason for Consult:SI septic arthritis Referring Physician: Taye Gonfa Time called: 1108 Time at bedside: 1122     [name] is an 73 y.o. female.  HPI: [name] was admitted yesterday with iliacus abscess and left SI joint osteo. She notes her left back and hip pain began about 6 weeks ago and waxed and waned until recently when it became much worse. She denies fevers, chills, sweats, N/V but has had some LE weakness and frequent falls. She denies prior hx/o similar.   Past Medical History:  Diagnosis Date   Anemia    pt pt report   Anxiety    Breast cancer (HCC)    mets to liver and lung   Breast cancer metastasized to multiple sites [facility]) 02/26/2013   Cellulitis    CHF (congestive heart failure) (HCC)    History of chemotherapy 09/2004   taxotere/herceptin /carboplatin   Hypertension    Neuropathy    Radiation 07/31/2006   left upper chest   Radiation 06/17/2006-06/27/2006   6480 cGy bilat. chest wall   SVC syndrome    Thrombosis     Past Surgical History:  Procedure Laterality Date   ANKLE SURGERY Left    BACK SURGERY     CHOLECYSTECTOMY  08/28/1987   ESOPHAGOGASTRODUODENOSCOPY Left 01/21/2024   Procedure: EGD (ESOPHAGOGASTRODUODENOSCOPY);  Surgeon: Evangeline Hilts, MD;  Location: [facility];  Service: Gastroenterology;  Laterality: Left;   MASS EXCISION Left 05/10/2022   Procedure: EXCISION OF LEFT CHEST WALL MASS;  Surgeon: Sim Dryer, MD;  Location: [facility] OR;  Service: General;  Laterality: Left;   MASTECTOMY Bilateral    w/ lymph node removal per patient   PERIPHERALLY INSERTED CENTRAL CATHETER INSERTION     PICC LINE INSERTION     PICC LINE REMOVAL ([facility] HX)     SURGICAL EXCISION OF EXCESSIVE SKIN     TUBAL LIGATION  08/27/1984    Family History  Problem Relation Age of Onset   Heart failure Father    Cancer Father        Prostate cancer   Heart failure Brother    Cancer Brother        Prostate cancer   Diabetes Maternal Aunt     Social  History:  reports that she has never smoked. She has never been exposed to tobacco smoke. She has never used smokeless tobacco. She reports current alcohol  use. She reports that she does not use drugs.  Allergies:  Allergies  Allergen Reactions   Adhesive [Tape] Other (See Comments)    Tears the skin    Penicillins Hives and Other (See Comments)    PATIENT HAS TOLERATED Cephalosporins    Medications: I have reviewed the patient's current medications.  Results for orders placed or performed during the hospital encounter of 02/13/24 (from the past 48 hours)  Comprehensive metabolic panel     Status: Abnormal   Collection Time: 02/13/24 11:48 AM  Result Value Ref Range   Sodium 135 135 - 145 mmol/L   Potassium 3.8 3.5 - 5.1 mmol/L   Chloride 103 98 - 111 mmol/L   CO2 21 (L) 22 - 32 mmol/L   Glucose, Bld 102 (H) 70 - 99 mg/dL    Comment: Glucose reference range applies only to samples taken after fasting for at least 8 hours.   BUN 13 8 - 23 mg/dL   Creatinine, Ser 2.53 (H) 0.44 - 1.00 mg/dL   Calcium  8.5 (L) 8.9 - 10.3 mg/dL   Total Protein 7.2 6.5 -  8.1 g/dL   Albumin 2.5 (L) 3.5 - 5.0 g/dL   AST 12 (L) 15 - 41 U/L   ALT 8 0 - 44 U/L   Alkaline Phosphatase 75 38 - 126 U/L   Total Bilirubin 1.2 0.0 - 1.2 mg/dL   GFR, Estimated 36 (L) >60 mL/min    Comment: (NOTE) Calculated using the CKD-EPI Creatinine Equation (2021)    Anion gap 11 5 - 15    Comment: Performed at [facility], [address], [place], [place] [address]  CBC with Differential     Status: Abnormal   Collection Time: 02/13/24 11:48 AM  Result Value Ref Range   WBC 9.6 4.0 - 10.5 K/uL   RBC 3.31 (L) 3.87 - 5.11 MIL/uL   Hemoglobin 9.1 (L) 12.0 - 15.0 g/dL   HCT 60.4 (L) 54.0 - 98.1 %   MCV 82.5 80.0 - 100.0 fL   MCH 27.5 26.0 - 34.0 pg   MCHC 33.3 30.0 - 36.0 g/dL   RDW 19.1 (H) 47.8 - 29.5 %   Platelets 234 150 - 400 K/uL   nRBC 0.0 0.0 - 0.2 %   Neutrophils Relative % 54 %   Neutro  Abs 5.2 1.7 - 7.7 K/uL   Lymphocytes Relative 33 %   Lymphs Abs 3.2 0.7 - 4.0 K/uL   Monocytes Relative 10 %   Monocytes Absolute 1.0 0.1 - 1.0 K/uL   Eosinophils Relative 2 %   Eosinophils Absolute 0.2 0.0 - 0.5 K/uL   Basophils Relative 1 %   Basophils Absolute 0.1 0.0 - 0.1 K/uL   Immature Granulocytes 0 %   Abs Immature Granulocytes 0.03 0.00 - 0.07 K/uL    Comment: Performed at [facility], [address], [place], [place] [address]  Blood Culture (routine x 2)     Status: None (Preliminary result)   Collection Time: 02/13/24 12:48 PM   Specimen: BLOOD  Result Value Ref Range   Specimen Description      BLOOD PORTA CATH Performed at [facility], [address] [place], [place] [address]    Special Requests      BOTTLES DRAWN AEROBIC AND ANAEROBIC Blood Culture results may not be optimal due to an inadequate volume of blood received in culture bottles Performed at [facility], [address], [place], [place] [address]    Culture      NO GROWTH < 24 HOURS Performed at [facility], [address], [place], [place] [address]    Report Status PENDING   Protime-INR     Status: Abnormal   Collection Time: 02/13/24  1:12 PM  Result Value Ref Range   Prothrombin Time 16.4 (H) 11.4 - 15.2 seconds   INR 1.3 (H) 0.8 - 1.2    Comment: (NOTE) INR goal varies based on device and disease states. Performed at [facility], [address], [place], [place] [address]   I-Stat Lactic Acid, ED     Status: None   Collection Time: 02/13/24  2:09 PM  Result Value Ref Range   Lactic Acid, Venous 0.5 0.5 - 1.9 mmol/L  Urinalysis, w/ Reflex to Culture (Infection Suspected) -Urine, Clean Catch     Status: Abnormal   Collection Time: 02/13/24  3:17 PM  Result Value Ref Range   Specimen Source URINE, CLEAN CATCH    Color, Urine YELLOW YELLOW   APPearance CLOUDY (A) CLEAR   Specific Gravity, Urine 1.012  1.005 - 1.030   pH 5.0 5.0 - 8.0   Glucose, UA NEGATIVE NEGATIVE mg/dL   Hgb urine dipstick SMALL (A) NEGATIVE   Bilirubin Urine NEGATIVE NEGATIVE   Ketones, ur NEGATIVE NEGATIVE mg/dL   Protein, ur NEGATIVE NEGATIVE mg/dL   Nitrite POSITIVE (A) NEGATIVE   Leukocytes,Ua LARGE (A) NEGATIVE   RBC / HPF 6-10 0 - 5 RBC/hpf   WBC, UA >50 0 - 5 WBC/hpf    Comment:        Reflex urine culture not performed if WBC <=10, OR if Squamous epithelial cells >5. If Squamous epithelial cells >5 suggest recollection.    Bacteria, UA MANY (A) NONE SEEN   Squamous Epithelial / HPF 0-5 0 - 5 /HPF   WBC Clumps PRESENT    Hyaline Casts, UA PRESENT     Comment: Performed at [facility], [address], [place], [place] [address]  Blood Culture (routine x 2)     Status: None (Preliminary result)   Collection Time: 02/13/24  9:44 PM   Specimen: BLOOD RIGHT ARM  Result Value Ref Range   Specimen Description BLOOD RIGHT ARM    Special Requests      BOTTLES DRAWN AEROBIC AND ANAEROBIC Blood Culture results may not be optimal due to an inadequate volume of blood received in culture bottles   Culture      NO GROWTH < 12 HOURS Performed at [facility], [address], [place], [place] [address]    Report Status PENDING   Sedimentation rate     Status: Abnormal   Collection Time: 02/13/24  9:44 PM  Result Value Ref Range   Sed Rate 96 (H) 0 - 22 mm/hr    Comment: Performed at [facility], [address], [place], [place] [address]  C-reactive protein     Status: Abnormal   Collection Time: 02/13/24  9:44 PM  Result Value Ref Range   CRP 2.1 (H) <1.0 mg/dL    Comment: Performed at [facility], [address] [place], [place] [address]  Basic metabolic panel     Status: Abnormal   Collection Time: 02/14/24  6:27 AM  Result Value Ref Range   Sodium 141 135 - 145 mmol/L   Potassium 4.0 3.5 - 5.1 mmol/L   Chloride 107 98 - 111 mmol/L   CO2 24 22 - 32 mmol/L    Glucose, Bld 89 70 - 99 mg/dL    Comment: Glucose reference range applies only to samples taken after fasting for at least 8 hours.   BUN 10 8 - 23 mg/dL   Creatinine, Ser 2.95 (H) 0.44 - 1.00 mg/dL   Calcium  8.7 (L) 8.9 - 10.3 mg/dL   GFR, Estimated 50 (L) >60 mL/min    Comment: (NOTE) Calculated using the CKD-EPI Creatinine Equation (2021)    Anion gap 10 5 - 15    Comment: Performed at [facility], [address], [place], [place] [address]  CBC     Status: Abnormal   Collection Time: 02/14/24  6:27 AM  Result Value Ref Range   WBC 6.9 4.0 - 10.5 K/uL   RBC 2.87 (L) 3.87 - 5.11 MIL/uL   Hemoglobin 7.8 (L) 12.0 - 15.0 g/dL   HCT 86.5 (L) 78.4 - 69.6 %   MCV 79.1 (L) 80.0 - 100.0 fL   MCH 27.2 26.0 - 34.0 pg   MCHC 34.4 30.0 - 36.0 g/dL   RDW  15.8 (H) 11.5 - 15.5 %   Platelets 205 150 - 400 K/uL   nRBC 0.0 0.0 - 0.2 %    Comment: Performed at [facility], [address], [place], [place] [address]   *Note: Due to a large number of results and/or encounters for the requested time period, some results have not been displayed. A complete set of results can be found in Results Review.    DG Chest Port 1 View Result Date: 02/13/2024 CLINICAL DATA:  Sepsis EXAM: PORTABLE CHEST 1 VIEW COMPARISON:  Chest radiograph dated 01/19/2024 FINDINGS: Right chest wall port tip projects over the superior cavoatrial junction. Normal lung volumes. No focal consolidations. No pleural effusion or pneumothorax. The heart size and mediastinal contours are within normal limits. No acute osseous abnormality. Bilateral axillary surgical clips. IMPRESSION: No consolidative pneumonia. Electronically Signed   By: Limin  Xu M.D.   On: 02/13/2024 14:38   MR HIP LEFT W WO CONTRAST Result Date: 02/12/2024 EXAM DESCRIPTION: MR HIP LEFT W WO CONTRAST CLINICAL HISTORY: infected hip versus met cancer causing necrosis and erosion, please evaluate further COMPARISON: None Available. TECHNIQUE: MRI of the hip  is performed according to our usual protocol with multiplanar multi sequence imaging. FINDINGS: There is moderate to severe marrow edema on either side of the left sacroiliac joint as well as small erosions and associated joint effusion. There is a moderate myositis to the left iliac as musculature. Fluid collection measuring close to 2 cm in greatest dimension consistent with intramuscular abscess. No significant degenerative change. There is moderate focal marrow edema to the mid coccyx. The marrow signal is otherwise unremarkable. Mild greater trochanteric bursal fluid on the left. No joint effusion. The tendons and musculature are otherwise unremarkable. IMPRESSION: Moderate to severe marrow edema on either side of the left sacroiliac joint as well as joint effusion and small erosions. Septic arthritis/osteomyelitis should BE excluded. Active inflammatory arthritis would also be in the differential. There is a focal fluid collection measuring up to 2 cm within the left iliacus musculature and associated myositis. This is suspicious for a small abscess. Given this finding, the adjacent sacroiliac joint findings are likely infectious in etiology. Moderate focal marrow edema to a mid coccygeal segment which is probably a mild fracture. A second site of infection should be excluded and this will need follow-up. Electronically signed by: Basilio Both MD 02/12/2024 09:18 PM EDT RP Workstation: [id number]   MR Brain W Wo Contrast Result Date: 02/12/2024 CLINICAL DATA:  Metastatic disease evaluation EXAM: MRI HEAD WITHOUT AND WITH CONTRAST TECHNIQUE: Multiplanar, multiecho pulse sequences of the brain and surrounding structures were obtained without and with intravenous contrast. CONTRAST:  10mL GADAVIST GADOBUTROL 1 MMOL/ML IV SOLN COMPARISON:  CT head 03/24/2021. FINDINGS: Brain: No acute infarction, hemorrhage, hydrocephalus, extra-axial collection or mass lesion. No pathologic enhancement. Vascular: Major  arterial flow voids are maintained at the skull base. Skull and upper cervical spine: Normal marrow signal. Sinuses/Orbits: Clear sinuses.  No acute orbital findings. Other: No mastoid effusions. IMPRESSION: No evidence of acute intracranial abnormality or metastatic disease. Electronically Signed   By: Stevenson Elbe M.D.   On: 02/12/2024 19:50    Review of Systems  Constitutional:  Negative for chills, diaphoresis and fever.  HENT:  Negative for ear discharge, ear pain, hearing loss and tinnitus.   Eyes:  Negative for photophobia and pain.  Respiratory:  Negative for cough and shortness of breath.   Cardiovascular:  Negative for chest pain.  Gastrointestinal:  Negative for abdominal pain, nausea and vomiting.  Genitourinary:  Negative for dysuria, flank pain, frequency and urgency.  Musculoskeletal:  Positive for arthralgias (Left hip) and back pain. Negative for myalgias and neck pain.  Neurological:  Positive for weakness (Bilateral knees). Negative for dizziness and headaches.  Hematological:  Does not bruise/bleed easily.  Psychiatric/Behavioral:  The patient is not nervous/anxious.    Blood pressure 116/61, pulse 72, temperature 98.2 F (36.8 C), temperature source Oral, resp. rate 16, height 5' 3 (1.6 m), weight 102.1 kg, SpO2 98%. Physical Exam Constitutional:      General: She is not in acute distress.    Appearance: She is well-developed. She is not diaphoretic.  HENT:     Head: Normocephalic and atraumatic.   Eyes:     General: No scleral icterus.       Right eye: No discharge.        Left eye: No discharge.     Conjunctiva/sclera: Conjunctivae normal.    Cardiovascular:     Rate and Rhythm: Normal rate and regular rhythm.  Pulmonary:     Effort: Pulmonary effort is normal. No respiratory distress.   Musculoskeletal:     Cervical back: Normal range of motion.     Comments: LLE No traumatic wounds, ecchymosis, or rash  Nontender  No knee or ankle  effusion  Knee stable to varus/ valgus and anterior/posterior stress  Sens DPN, SPN, TN intact  Motor EHL, ext, flex, evers 5/5  DP 1+, PT 1+, 1+ NP edema   Skin:    General: Skin is warm and dry.   Neurological:     Mental Status: She is alert.   Psychiatric:        Mood and Affect: Mood normal.        Behavior: Behavior normal.     Assessment/Plan: Left pelvic abscess and SI osteomyelitis -- Recommend medical treatment initially for this patient. She may WBAT LLE. F/u with Dr. Curtiss Dowdy in 3-4 weeks from discharge.    Georganna Kin, PA-C Orthopedic Surgery [phone] 02/14/2024, 1:14 PM

## 2024-02-14 NOTE — Procedures (Signed)
  Procedure:  CT aspiration L iliacus process scant bloody fluid, sent for GS C&S Preprocedure diagnosis: The primary encounter diagnosis was Left hip pain. Diagnoses of Other acute osteomyelitis, other site [facility]) and Acute cystitis without hematuria were also pertinent to this visit. Postprocedure diagnosis: same EBL:    minimal Complications:   none immediate  See full dictation in [employer].  Nicky Barrack MD Main # [phone] Pager  [phone] Mobile [phone]

## 2024-02-15 ENCOUNTER — Inpatient Hospital Stay (HOSPITAL_COMMUNITY)

## 2024-02-15 DIAGNOSIS — M5126 Other intervertebral disc displacement, lumbar region: Secondary | ICD-10-CM | POA: Diagnosis not present

## 2024-02-15 DIAGNOSIS — M5127 Other intervertebral disc displacement, lumbosacral region: Secondary | ICD-10-CM | POA: Diagnosis not present

## 2024-02-15 DIAGNOSIS — K3533 Acute appendicitis with perforation and localized peritonitis, with abscess: Secondary | ICD-10-CM | POA: Diagnosis not present

## 2024-02-15 DIAGNOSIS — M4807 Spinal stenosis, lumbosacral region: Secondary | ICD-10-CM | POA: Diagnosis not present

## 2024-02-15 DIAGNOSIS — M48061 Spinal stenosis, lumbar region without neurogenic claudication: Secondary | ICD-10-CM | POA: Diagnosis not present

## 2024-02-15 LAB — CK: Total CK: 22 U/L — ABNORMAL LOW (ref 38–234)

## 2024-02-15 LAB — CBC
HCT: 22.1 % — ABNORMAL LOW (ref 36.0–46.0)
Hemoglobin: 7.5 g/dL — ABNORMAL LOW (ref 12.0–15.0)
MCH: 27.5 pg (ref 26.0–34.0)
MCHC: 33.9 g/dL (ref 30.0–36.0)
MCV: 81 fL (ref 80.0–100.0)
Platelets: 198 10*3/uL (ref 150–400)
RBC: 2.73 MIL/uL — ABNORMAL LOW (ref 3.87–5.11)
RDW: 15.9 % — ABNORMAL HIGH (ref 11.5–15.5)
WBC: 6.6 10*3/uL (ref 4.0–10.5)
nRBC: 0 % (ref 0.0–0.2)

## 2024-02-15 LAB — URINE CULTURE: Culture: >=100000 — AB

## 2024-02-15 LAB — RENAL FUNCTION PANEL
Albumin: 1.8 g/dL — ABNORMAL LOW (ref 3.5–5.0)
Anion gap: 6 (ref 5–15)
BUN: 10 mg/dL (ref 8–23)
CO2: 24 mmol/L (ref 22–32)
Calcium: 8.2 mg/dL — ABNORMAL LOW (ref 8.9–10.3)
Chloride: 108 mmol/L (ref 98–111)
Creatinine, Ser: 1.16 mg/dL — ABNORMAL HIGH (ref 0.44–1.00)
GFR, Estimated: 50 mL/min — ABNORMAL LOW (ref >60)
Glucose, Bld: 84 mg/dL (ref 70–99)
Phosphorus: 3.6 mg/dL (ref 2.5–4.6)
Potassium: 3.7 mmol/L (ref 3.5–5.1)
Sodium: 138 mmol/L (ref 135–145)

## 2024-02-15 LAB — MAGNESIUM: Magnesium: 1.4 mg/dL — ABNORMAL LOW (ref 1.7–2.4)

## 2024-02-15 MED ORDER — OXYCODONE HCL 5 MG PO TABS
5.0000 mg | ORAL_TABLET | Freq: Four times a day (QID) | ORAL | Status: DC | PRN
  Administered 2024-02-16 – 2024-02-18 (×3): 5 mg via ORAL
  Filled 2024-02-15 (×4): qty 1

## 2024-02-15 MED ORDER — LACTATED RINGERS IV BOLUS
500.0000 mL | Freq: Once | INTRAVENOUS | Status: AC
  Administered 2024-02-15: 500 mL via INTRAVENOUS

## 2024-02-15 MED ORDER — TEMAZEPAM 7.5 MG PO CAPS
15.0000 mg | ORAL_CAPSULE | Freq: Every day | ORAL | Status: DC
  Administered 2024-02-15 – 2024-02-19 (×5): 15 mg via ORAL
  Filled 2024-02-15 (×5): qty 2

## 2024-02-15 MED ORDER — GADOBUTROL 1 MMOL/ML IV SOLN
10.0000 mL | Freq: Once | INTRAVENOUS | Status: AC | PRN
  Administered 2024-02-15: 10 mL via INTRAVENOUS

## 2024-02-15 MED ORDER — MAGNESIUM SULFATE 2 GM/50ML IV SOLN
2.0000 g | Freq: Once | INTRAVENOUS | Status: AC
  Administered 2024-02-15: 2 g via INTRAVENOUS
  Filled 2024-02-15: qty 50

## 2024-02-15 MED ORDER — CARVEDILOL 6.25 MG PO TABS
6.2500 mg | ORAL_TABLET | Freq: Two times a day (BID) | ORAL | Status: DC
  Administered 2024-02-15 – 2024-02-16 (×3): 6.25 mg via ORAL
  Filled 2024-02-15 (×3): qty 1

## 2024-02-15 MED ORDER — HYDROMORPHONE HCL 1 MG/ML IJ SOLN
0.5000 mg | INTRAMUSCULAR | Status: DC | PRN
  Administered 2024-02-18: 0.5 mg via INTRAVENOUS
  Filled 2024-02-15: qty 0.5

## 2024-02-15 NOTE — Progress Notes (Signed)
 PROGRESS NOTE  [name] FMW:[id number] DOB: 03-08-1951   PCP: Jefferey Fitch, MD  Patient is from: Home.  DOA: 02/13/2024 LOS: 2  Chief complaints Chief Complaint  Patient presents with   CA Pt   Abnormal Labs   Hip Pain     Brief Narrative / Interim history: 73 year old F with PMH of breast cancer, diastolic CHF, PE on Eliquis , HTN and anxiety presenting with waxing and waning left hip pain for about 6 weeks that has acutely gotten worse, and admitted with possible SI joint osteomyelitis with possible abscess.  Patient had outpatient MRI of her pelvis the day prior to admission, and she was called by her provider to come to the ER due to concern for iliacus abscess and left-sided SI joint osteomyelitis.  Patient denies any recent fevers or chills, no dysuria, cough, no recent bacterial infection.  Apparently yesterday she was not acting herself and so her oncology provider was concerned about a UTI.  Urine culture with E. coli.   Orthopedic surgery, ID and IR consulted.  Ortho recommended medical treatment.  She underwent CT aspiration of left iliacus process by IR.  Started on daptomycin  and ceftriaxone  by ID.  Subjective: Seen and examined earlier this afternoon.  No major events this morning.  No complaints. She is oriented x4 but a little confused  Objective: Vitals:   02/14/24 1547 02/14/24 1943 02/15/24 0426 02/15/24 0748  BP: (!) 114/59 (!) 105/50 (!) 100/49 93/63  Pulse: 76 80 71 77  Resp: 16 16 18 17   Temp: 98.9 F (37.2 C) 98.9 F (37.2 C) 97.8 F (36.6 C) 98.2 F (36.8 C)  TempSrc: Oral Oral Oral Oral  SpO2: 96% 97% 100% 98%  Weight:      Height:        Examination:  GENERAL: No apparent distress.  Nontoxic. HEENT: MMM.  Vision and hearing grossly intact.  NECK: Supple.  No apparent JVD.  RESP:  No IWOB.  Fair aeration bilaterally. CVS:  RRR. Heart sounds normal.  ABD/GI/GU: BS+. Abd soft, NTND.  MSK/EXT:  Moves extremities. No apparent  deformity.  1+ BLE edema SKIN: no apparent skin lesion or wound NEURO: AA.  Oriented x4 but a little confused.  No apparent focal neuro deficit. PSYCH: Calm. Normal affect.   Consultants:  Infectious disease Orthopedic surgery Interventional radiology  Procedures: Aspiration of left iliacus process by IR  Microbiology summarized: Blood cultures NGTD Aspiration culture pending Urine culture pending  Assessment and plan: SI joint septic arthritis/osteomyelitis, iliacus abscess-patient presents with waxing or waning left hip pain for about 6 weeks that has acutely gotten worse.  MRI left hip on 6/18 with moderate to severe marrow edema on either side of the left sacroiliac joint as well as joint effusion and small erosion concerning for septic arthritis/osteomyelitis.  MRI also showed 2 cm left iliacus fluid collection concerning for abscess. MRI lumbar spine with broad-based disc protrusion at L2-L3, L4-L5 and L5-S1 with mild to moderate foraminal stenosis.  Blood cultures NGTD.  CRP 2.1.  No fever or leukocytosis.  Lactic acid negative. -S/p IR aspiration of left iliacus abscess.  Culture with group B strep -On daptomycin  and ceftriaxone  per ID -No plan for surgical intervention from Ortho standpoint.  WBAT on LLE. -Pain control with bowel regimen -PT/OT.  Chronic diastolic CHF: TTE on 3/18 with LVEF of 55 to 60%.  Appears euvolemic on exam except for BLE edema.  Takes Lasix  as needed for edema.  Normotensive. -Continue holding home Lasix ,  Aldactone  and losartan  -Continue home Coreg   Microcytic anemia: Slight drop in Hgb.  No overt bleeding.  Relatively stable after initial drop.  No nutritional deficiency on anemia panel. Recent Labs    01/19/24 1529 01/20/24 0513 01/21/24 0210 01/21/24 1154 01/28/24 1122 02/12/24 1153 02/13/24 1148 02/14/24 0627 02/15/24 0411  HGB 10.8* 11.0* 10.0* 9.9* 10.1* 9.6* 9.1* 7.8* 7.5*  -Continue monitoring   Metastatic breast cancer s/p  mastectomy: No intracranial metastasis on MRI brain. -Outpatient follow-up   E. coli UTI: Urine culture on 6/18 with E. Coli.  Sensitive to ceftriaxone  -Antibiotics as above   History of PE - Resume Eliquis    GERD-Protonix    Hyperlipidemia-Crestor   Hypomagnesemia - IV magnesium  sulfate 2 g x 1  At risk for polypharmacy: On multiple sedating medications. -Will review meds with patient  Class III obesity Body mass index is 39.86 kg/m. - Encourage lifestyle change to lose weight          DVT prophylaxis:  SCDs Start: 02/13/24 1418 apixaban  (ELIQUIS ) tablet 5 mg  Code Status: DNR Family Communication: None at bedside today. Level of care: Med-Surg Status is: Inpatient Remains inpatient appropriate because: Septic arthritis/osteomyelitis   Final disposition: Likely home once medically stable   55 minutes with more than 50% spent in reviewing records, counseling patient/family and coordinating care.   Sch Meds:  Scheduled Meds:  apixaban   5 mg Oral BID   carvedilol   18.75 mg Oral BID WC   Chlorhexidine  Gluconate Cloth  6 each Topical Daily   DULoxetine   60 mg Oral Daily   gabapentin   300 mg Oral TID   pantoprazole   40 mg Oral Daily   rosuvastatin   10 mg Oral Daily   sodium chloride  flush  10-40 mL Intracatheter Q12H   temazepam   30 mg Oral QHS   Continuous Infusions:  cefTRIAXone  (ROCEPHIN )  IV 2 g (02/15/24 0941)   DAPTOmycin  700 mg (02/15/24 1327)   PRN Meds:.acetaminophen  **OR** acetaminophen , albuterol , ALPRAZolam , HYDROmorphone  (DILAUDID ) injection, ondansetron  **OR** ondansetron  (ZOFRAN ) IV, oxyCODONE , senna-docusate, sodium chloride  flush  Antimicrobials: Anti-infectives (From admission, onward)    Start     Dose/Rate Route Frequency Ordered Stop   02/14/24 1400  cefTRIAXone  (ROCEPHIN ) 2 g in sodium chloride  0.9 % 100 mL IVPB        2 g 200 mL/hr over 30 Minutes Intravenous Daily 02/14/24 1207     02/14/24 1300  DAPTOmycin  (CUBICIN ) IVPB 700  mg/139mL premix        700 mg 200 mL/hr over 30 Minutes Intravenous Daily 02/14/24 1207     02/13/24 1445  cefTRIAXone  (ROCEPHIN ) 2 g in sodium chloride  0.9 % 100 mL IVPB  Status:  Discontinued        2 g 200 mL/hr over 30 Minutes Intravenous Every 24 hours 02/13/24 1430 02/13/24 1438        I have personally reviewed the following labs and images: CBC: Recent Labs  Lab 02/12/24 1153 02/13/24 1148 02/14/24 0627 02/15/24 0411  WBC 10.3 9.6 6.9 6.6  NEUTROABS 5.6 5.2  --   --   HGB 9.6* 9.1* 7.8* 7.5*  HCT 27.3* 27.3* 22.7* 22.1*  MCV 78.2* 82.5 79.1* 81.0  PLT 260 234 205 198   BMP &GFR Recent Labs  Lab 02/12/24 1153 02/13/24 1148 02/14/24 0627 02/15/24 0411  NA 137 135 141 138  K 4.1 3.8 4.0 3.7  CL 102 103 107 108  CO2 26 21* 24 24  GLUCOSE 110* 102* 89 84  BUN 13  13 10 10   CREATININE 2.02* 1.52* 1.17* 1.16*  CALCIUM  8.8* 8.5* 8.7* 8.2*  MG 1.4*  --   --  1.4*  PHOS  --   --   --  3.6   Estimated Creatinine Clearance: 50 mL/min (A) (by C-G formula based on SCr of 1.16 mg/dL (H)). Liver & Pancreas: Recent Labs  Lab 02/12/24 1153 02/13/24 1148 02/15/24 0411  AST 11* 12*  --   ALT 5 8  --   ALKPHOS 83 75  --   BILITOT 0.7 1.2  --   PROT 7.5 7.2  --   ALBUMIN 3.0* 2.5* 1.8*   No results for input(s): LIPASE, AMYLASE in the last 168 hours. No results for input(s): AMMONIA in the last 168 hours. Diabetic: No results for input(s): HGBA1C in the last 72 hours. No results for input(s): GLUCAP in the last 168 hours. Cardiac Enzymes: Recent Labs  Lab 02/15/24 0411  CKTOTAL 22*   No results for input(s): PROBNP in the last 8760 hours. Coagulation Profile: Recent Labs  Lab 02/13/24 1312  INR 1.3*   Thyroid Function Tests: No results for input(s): TSH, T4TOTAL, FREET4, T3FREE, THYROIDAB in the last 72 hours. Lipid Profile: No results for input(s): CHOL, HDL, LDLCALC, TRIG, CHOLHDL, LDLDIRECT in the last 72  hours. Anemia Panel: Recent Labs    02/13/24 2144 02/14/24 0627  VITAMINB12 690  --   FOLATE  --  6.7  FERRITIN 167  --   TIBC 204*  --   IRON 59  --   RETICCTPCT  --  2.4   Urine analysis:    Component Value Date/Time   COLORURINE YELLOW 02/13/2024 1517   APPEARANCEUR CLOUDY (A) 02/13/2024 1517   LABSPEC 1.012 02/13/2024 1517   LABSPEC 1.020 10/28/2014 1142   PHURINE 5.0 02/13/2024 1517   GLUCOSEU NEGATIVE 02/13/2024 1517   GLUCOSEU Negative 10/28/2014 1142   HGBUR SMALL (A) 02/13/2024 1517   BILIRUBINUR NEGATIVE 02/13/2024 1517   BILIRUBINUR Negative 10/28/2014 1142   KETONESUR NEGATIVE 02/13/2024 1517   PROTEINUR NEGATIVE 02/13/2024 1517   UROBILINOGEN 0.2 10/28/2014 1142   NITRITE POSITIVE (A) 02/13/2024 1517   LEUKOCYTESUR LARGE (A) 02/13/2024 1517   LEUKOCYTESUR Trace 10/28/2014 1142   Sepsis Labs: Invalid input(s): PROCALCITONIN, LACTICIDVEN  Microbiology: Recent Results (from the past 240 hours)  Culture, Urine     Status: Abnormal   Collection Time: 02/12/24 11:30 AM   Specimen: Urine, Clean Catch  Result Value Ref Range Status   Specimen Description   Final    URINE, CLEAN CATCH Performed at [facility], [address] [place], [place] [address]    Special Requests   Final    NONE Performed at [facility], [address], [place], [place] [address]    Culture >=100,000 COLONIES/mL ESCHERICHIA COLI (A)  Final   Report Status 02/14/2024 FINAL  Final   Organism ID, Bacteria ESCHERICHIA COLI (A)  Final      Susceptibility   Escherichia coli - MIC*    AMPICILLIN >=32 RESISTANT Resistant     CEFAZOLIN >=64 RESISTANT Resistant     CEFEPIME  <=0.12 SENSITIVE Sensitive     CEFTRIAXONE  1 SENSITIVE Sensitive     CIPROFLOXACIN <=0.25 SENSITIVE Sensitive     GENTAMICIN <=1 SENSITIVE Sensitive     IMIPENEM <=0.25 SENSITIVE Sensitive     NITROFURANTOIN <=16 SENSITIVE Sensitive     TRIMETH/SULFA <=20  SENSITIVE Sensitive     AMPICILLIN/SULBACTAM >=32 RESISTANT Resistant  PIP/TAZO 8 SENSITIVE Sensitive ug/mL    * >=100,000 COLONIES/mL ESCHERICHIA COLI  Blood Culture (routine x 2)     Status: None (Preliminary result)   Collection Time: 02/13/24 12:48 PM   Specimen: BLOOD  Result Value Ref Range Status   Specimen Description   Final    BLOOD PORTA CATH Performed at [facility], [address] [place], [place] [address]    Special Requests   Final    BOTTLES DRAWN AEROBIC AND ANAEROBIC Blood Culture results may not be optimal due to an inadequate volume of blood received in culture bottles Performed at [facility], [address] [place], [place] [address]    Culture   Final    NO GROWTH 2 DAYS Performed at [facility], [address] [place], [place] [address]    Report Status PENDING  Incomplete  Urine Culture     Status: Abnormal (Preliminary result)   Collection Time: 02/13/24  3:17 PM   Specimen: Urine, Random  Result Value Ref Range Status   Specimen Description   Final    URINE, RANDOM Performed at [facility], [address] [place], [place] [address]    Special Requests   Final    NONE Reflexed from [phone] [id number] Performed at [facility], [address] [place], [place] [address]    Culture (A)  Final    >=100,000 COLONIES/mL ESCHERICHIA COLI SUSCEPTIBILITIES TO FOLLOW Performed at [facility], [address] [place], [place] [address]    Report Status PENDING  Incomplete  Blood Culture (routine x 2)     Status: Abnormal (Preliminary result)   Collection Time: 02/13/24  9:44 PM   Specimen: BLOOD RIGHT ARM  Result Value Ref Range Status   Specimen Description BLOOD RIGHT ARM  Final   Special Requests   Final    BOTTLES DRAWN AEROBIC AND ANAEROBIC Blood Culture results may not be optimal due to an inadequate volume of blood received in culture bottles   Culture  Setup  Time   Final    GRAM POSITIVE COCCI IN CHAINS BOTTLES DRAWN AEROBIC ONLY CRITICAL RESULT CALLED TO, READ BACK BY AND VERIFIED WITH: PHARMD C. AMEND [phone] @ 1909 FH    Culture (A)  Final    GROUP B STREP(S.AGALACTIAE)ISOLATED SUSCEPTIBILITIES TO FOLLOW Performed at [facility], [address], [place], [place] [address]    Report Status PENDING  Incomplete  Blood Culture ID Panel (Reflexed)     Status: Abnormal   Collection Time: 02/13/24  9:44 PM  Result Value Ref Range Status   Enterococcus faecalis NOT DETECTED NOT DETECTED Final   Enterococcus Faecium NOT DETECTED NOT DETECTED Final   Listeria monocytogenes NOT DETECTED NOT DETECTED Final   Staphylococcus species NOT DETECTED NOT DETECTED Final   Staphylococcus aureus (BCID) NOT DETECTED NOT DETECTED Final   Staphylococcus epidermidis NOT DETECTED NOT DETECTED Final   Staphylococcus lugdunensis NOT DETECTED NOT DETECTED Final   Streptococcus species DETECTED (A) NOT DETECTED Final    Comment: CRITICAL RESULT CALLED TO, READ BACK BY AND VERIFIED WITH: PHARMD C. AMEND [phone] @ 1909 FH    Streptococcus agalactiae DETECTED (A) NOT DETECTED Final    Comment: CRITICAL RESULT CALLED TO, READ BACK BY AND VERIFIED WITH: PHARMD C. AMEND [phone] @ 1909 FH    Streptococcus pneumoniae NOT DETECTED NOT DETECTED Final   Streptococcus pyogenes NOT DETECTED NOT DETECTED Final   A.calcoaceticus-baumannii NOT DETECTED  NOT DETECTED Final   Bacteroides fragilis NOT DETECTED NOT DETECTED Final   Enterobacterales NOT DETECTED NOT DETECTED Final   Enterobacter cloacae complex NOT DETECTED NOT DETECTED Final   Escherichia coli NOT DETECTED NOT DETECTED Final   Klebsiella aerogenes NOT DETECTED NOT DETECTED Final   Klebsiella oxytoca NOT DETECTED NOT DETECTED Final   Klebsiella pneumoniae NOT DETECTED NOT DETECTED Final   Proteus species NOT DETECTED NOT DETECTED Final   Salmonella species NOT DETECTED NOT DETECTED Final   Serratia  marcescens NOT DETECTED NOT DETECTED Final   Haemophilus influenzae NOT DETECTED NOT DETECTED Final   Neisseria meningitidis NOT DETECTED NOT DETECTED Final   Pseudomonas aeruginosa NOT DETECTED NOT DETECTED Final   Stenotrophomonas maltophilia NOT DETECTED NOT DETECTED Final   Candida albicans NOT DETECTED NOT DETECTED Final   Candida auris NOT DETECTED NOT DETECTED Final   Candida glabrata NOT DETECTED NOT DETECTED Final   Candida krusei NOT DETECTED NOT DETECTED Final   Candida parapsilosis NOT DETECTED NOT DETECTED Final   Candida tropicalis NOT DETECTED NOT DETECTED Final   Cryptococcus neoformans/gattii NOT DETECTED NOT DETECTED Final    Comment: Performed at [facility], [address], [place], [place] [address]  Aerobic/Anaerobic Culture w Gram Stain (surgical/deep wound)     Status: None (Preliminary result)   Collection Time: 02/14/24 11:56 AM   Specimen: Pelvis; Abscess  Result Value Ref Range Status   Specimen Description PELVIS  Final   Special Requests LEFT  Final   Gram Stain NO WBC SEEN NO ORGANISMS SEEN   Final   Culture   Final    NO GROWTH < 24 HOURS Performed at [facility], [address] [place], [place] [address]    Report Status PENDING  Incomplete    Radiology Studies: MR Lumbar Spine W Wo Contrast Result Date: 02/15/2024 EXAM: MRI LUMBAR SPINE 02/15/2024 06:49:41 AM TECHNIQUE: Multiplanar multisequence MRI of the lumbar spine was performed with and without the administration of 10mL intravenous gadobutrol  (GADAVIST ) 1 MMOL/ML. COMPARISON: MRI of the lumbar spine 01/23/2024. CLINICAL HISTORY: Lower back pain. FINDINGS: BONES AND ALIGNMENT: Rightward curvature of the lumbar spine is centered at L3. Type 1 and type 2 Modic changes are present at L2-3. Type 2 Modic changes are present at L5-S1. SPINAL CORD: Conus medullaris terminates at L1-L2. SOFT TISSUES: A simple cyst in the right kidney measuring 3.3 cm is stable. Recommend no imaging to follow  up. L1-L2: Mild desiccation and disc bulging are present without focal stenosis. L2-L3: Chronic loss of disc height. A broad-based disc protrusion extends into the foramina bilaterally. Mild bilateral foraminal stenosis is present. L3-L4: Mild disc desiccation is present. No focal stenosis is present. L4-L5: A broad-based disc protrusion is present. Moderate right subarticular and bilateral moderate foraminal stenosis are present. L5-S1: A rightward disc protrusion is present. Mild right subarticular narrowing is present. Moderate right and mild left foraminal stenosis are present. IMPRESSION: 1. Broad-based disc protrusion at L2-L3 with mild bilateral foraminal stenosis. 2. Broad-based disc protrusion at L4-L5 with moderate right subarticular and bilateral moderate foraminal stenosis. 3. Rightward disc protrusion at L5-S1 with mild right subarticular narrowing and moderate right and mild left foraminal stenosis. Electronically signed by: Lonni Necessary MD 02/15/2024 08:59 AM EDT RP Workstation: [id number]      Sariya Trickey T. Ashten Prats [facility]  If 7PM-7AM, please contact night-coverage [email or web address] 02/15/2024, 2:01 PM

## 2024-02-15 NOTE — Evaluation (Signed)
 Physical Therapy Evaluation Patient Details Name: [name] MRN: [id number] DOB: 1950-11-03 Today's Date: 02/15/2024  History of Present Illness  73 y.o. female presents to [facility] hospital on 02/13/2024 with L hip pain, admitted for possible SI osteomyelitis and abscess. CT guided aspiration of L iliacus on 6/20. PMH includes breast CA, PE, CHF, HTN, anxiety.  Clinical Impression  Prior to admittance pt was mod I, utilizing manual wheelchair for mobility and reports mod I with ADLs. Pt presents to evaluation with deficits in mobility, strength, balance, power, activity tolerance, and pain, all limiting pt's ability to mobilize near baseline. Pt was able to perform stand-pivot transfer to the R w/out an AD and minimal physical assistance; pt stabilizes via upper extremity support on transfer surface (arm rests or bed rails). Pt would benefit from further transfer training, trialing use of AD for transfers. PT will continue to treat pt while she is admitted. Pt reports receiving HHPT and HHOT prior to admittance. Recommending HHPT at discharge to address remaining mobility deficits and optimize return to PLOF.         If plan is discharge home, recommend the following: A little help with walking and/or transfers;A little help with bathing/dressing/bathroom;Assistance with cooking/housework;Assist for transportation;Help with stairs or ramp for entrance   Can travel by private vehicle        Equipment Recommendations None recommended by PT  Recommendations for Other Services       Functional Status Assessment Patient has had a recent decline in their functional status and demonstrates the ability to make significant improvements in function in a reasonable and predictable amount of time.     Precautions / Restrictions Precautions Precautions: Fall Recall of Precautions/Restrictions: Intact Restrictions Weight Bearing Restrictions Per Provider Order: Yes LLE Weight Bearing Per Provider  Order: Weight bearing as tolerated      Mobility  Bed Mobility Overal bed mobility: Needs Assistance Bed Mobility: Supine to Sit, Sit to Supine     Supine to sit: Supervision, HOB elevated, Used rails Sit to supine: Supervision, HOB elevated, Used rails   General bed mobility comments: increased time to complete    Transfers Overall transfer level: Needs assistance Equipment used: None Transfers: Bed to chair/wheelchair/BSC   Stand pivot transfers: Min assist         General transfer comment: Pt trialed lateral scoot transfer to R from EOB to chair w/ drop arms, but is unable to complete reporting bed is too soft. Pt then completed stand-pivot transfer bed<>chair w/ minA. Pt pushes up from surface with bilateral upper extremity and reaches for surface transferring to, for stability. VC given for sequencing; increased time to complete.    Ambulation/Gait                  Stairs            Wheelchair Mobility     Tilt Bed    Modified Rankin (Stroke Patients Only)       Balance Overall balance assessment: History of Falls, Needs assistance Sitting-balance support: No upper extremity supported, Feet supported Sitting balance-Leahy Scale: Good Sitting balance - Comments: sitting EOB   Standing balance support: Single extremity supported, During functional activity Standing balance-Leahy Scale: Poor Standing balance comment: reliant on external support                             Pertinent Vitals/Pain Pain Assessment Pain Assessment: Faces Faces Pain Scale: Hurts little more  Pain Location: B knees Pain Descriptors / Indicators: Discomfort, Aching Pain Intervention(s): Limited activity within patient's tolerance, Monitored during session    Home Living Family/patient expects to be discharged to:: Private residence Living Arrangements: Spouse/significant other Available Help at Discharge: Family;Available 24 hours/day (spouse is able  to help 24/7 (has prosthetic) and children live closeby and can help if needed) Type of Home: House Home Access: Stairs to enter;Ramped entrance Entrance Stairs-Rails: None Entrance Stairs-Number of Steps: 3   Home Layout: One level Home Equipment: Rollator (4 wheels);Rolling Walker (2 wheels);Wheelchair - manual;Wheelchair - power;Grab bars - tub/shower;Grab bars - toilet;Hand held shower head;Tub bench;BSC/3in1;Cane - single point;Other (comment) (stand up rollator)      Prior Function Prior Level of Function : Independent/Modified Independent;History of Falls (last six months)             Mobility Comments: mod I performing slide or stand-pivot to transfer (prefers to transfer to the R but can do both) manual wheelchair. ADLs Comments: mod I     Extremity/Trunk Assessment   Upper Extremity Assessment Upper Extremity Assessment: Defer to OT evaluation    Lower Extremity Assessment Lower Extremity Assessment: Generalized weakness    Cervical / Trunk Assessment Cervical / Trunk Assessment: Kyphotic  Communication   Communication Communication: No apparent difficulties    Cognition Arousal: Alert Behavior During Therapy: WFL for tasks assessed/performed   PT - Cognitive impairments: No apparent impairments                         Following commands: Intact       Cueing Cueing Techniques: Verbal cues, Visual cues     General Comments General comments (skin integrity, edema, etc.): no signs of acute distress    Exercises     Assessment/Plan    PT Assessment Patient needs continued PT services  PT Problem List Decreased strength;Decreased range of motion;Decreased activity tolerance;Decreased balance;Decreased mobility;Decreased coordination;Decreased knowledge of use of DME;Pain       PT Treatment Interventions DME instruction;Functional mobility training;Therapeutic activities;Therapeutic exercise;Balance training;Patient/family  education;Wheelchair mobility training;Manual techniques;Modalities    PT Goals (Current goals can be found in the Care Plan section)  Acute Rehab PT Goals Patient Stated Goal: to go home PT Goal Formulation: With patient Time For Goal Achievement: 02/29/24 Potential to Achieve Goals: Good    Frequency Min 2X/week     Co-evaluation               AM-PAC PT 6 Clicks Mobility  Outcome Measure Help needed turning from your back to your side while in a flat bed without using bedrails?: A Little Help needed moving from lying on your back to sitting on the side of a flat bed without using bedrails?: A Little Help needed moving to and from a bed to a chair (including a wheelchair)?: A Little Help needed standing up from a chair using your arms (e.g., wheelchair or bedside chair)?: A Little Help needed to walk in hospital room?: A Lot Help needed climbing 3-5 steps with a railing? : Total 6 Click Score: 15    End of Session Equipment Utilized During Treatment: Gait belt Activity Tolerance: Patient tolerated treatment well Patient left: in bed;with call bell/phone within reach;with bed alarm set Nurse Communication: Mobility status PT Visit Diagnosis: Muscle weakness (generalized) (M62.81);History of falling (Z91.81)    Time: 9171-9094 PT Time Calculation (min) (ACUTE ONLY): 37 min   Charges:   PT Evaluation $PT Eval Low Complexity: 1 Low  PT General Charges $$ ACUTE PT VISIT: 1 Visit         Leontine Hilt, SPT Acute Rehab [phone]   Leontine Hilt 02/15/2024, 11:21 AM

## 2024-02-15 NOTE — Evaluation (Signed)
 Occupational Therapy Evaluation Patient Details Name: [name] MRN: [id number] DOB: 03-07-1951 Today's Date: 02/15/2024   History of Present Illness   73 y.o. female presents to [facility] hospital on 02/13/2024 with L hip pain, admitted for possible SI osteomyelitis and abscess. CT guided aspiration of L iliacus on 6/20. PMH includes breast CA, PE, CHF, HTN, anxiety.     Clinical Impressions PTA pt lives with her husband and primarily uses a wc for mobility in the home. She states she uses her rollator at times in the home and in the community. Pt overall modified independent with ADL tasks and helps with IADL tasks as she can. Pt reports multiple recent falls and has been active with HHOT/PT for the last month. Recommend DC home and resume HHOT. Recommend direct assistance with all mobility and ADL tasks as pt is a high fall risk. Pt primarily limited by B knee pain at this time and reports she has tried pain patches and Voltaren  gel, but nothing seems to help. Encourage use of BSC rather than Purewick - discussed with NT. Acute OT to follow to facilitate safe DC home.      If plan is discharge home, recommend the following:   A little help with walking and/or transfers;A little help with bathing/dressing/bathroom;Assistance with cooking/housework;Direct supervision/assist for medications management;Direct supervision/assist for financial management;Assist for transportation;Help with stairs or ramp for entrance     Functional Status Assessment   Patient has had a recent decline in their functional status and demonstrates the ability to make significant improvements in function in a reasonable and predictable amount of time.     Equipment Recommendations   None recommended by OT     Recommendations for Other Services         Precautions/Restrictions   Precautions Precautions: Fall Restrictions LLE Weight Bearing Per Provider Order: Weight bearing as tolerated      Mobility Bed Mobility Overal bed mobility: Needs Assistance Bed Mobility: Supine to Sit, Sit to Supine     Supine to sit: Supervision Sit to supine: Mod assist (A to lift B legs back into bed)        Transfers Overall transfer level: Needs assistance Equipment used: Rolling walker (2 wheels) Transfers: Sit to/from Stand Sit to Stand: Contact guard assist Stand pivot transfers: Min assist                Balance Overall balance assessment: History of Falls, Needs assistance (4 falls in the past month)   Sitting balance-Leahy Scale: Good       Standing balance-Leahy Scale: Poor                             ADL either performed or assessed with clinical judgement   ADL Overall ADL's : Needs assistance/impaired Eating/Feeding: Independent   Grooming: Set up;Sitting   Upper Body Bathing: Supervision/ safety;Sitting   Lower Body Bathing: Minimal assistance;Sit to/from stand   Upper Body Dressing : Set up;Sitting   Lower Body Dressing: Minimal assistance;Sit to/from stand   Toilet Transfer: Ambulation;Minimal assistance;Rolling walker (2 wheels)   Toileting- Clothing Manipulation and Hygiene: Contact guard assist;Sit to/from stand       Functional mobility during ADLs: Minimal assistance;Rolling walker (2 wheels);Cueing for safety       Vision Baseline Vision/History: 1 Wears glasses Ability to See in Adequate Light: 0 Adequate Vision Assessment?: No apparent visual deficits;Wears glasses for reading     Perception  Praxis         Pertinent Vitals/Pain Pain Assessment Pain Assessment: 0-10 Pain Score: 4  Pain Location: B knees Pain Descriptors / Indicators: Discomfort, Aching Pain Intervention(s): Limited activity within patient's tolerance     Extremity/Trunk Assessment Upper Extremity Assessment Upper Extremity Assessment: Defer to OT evaluation   Lower Extremity Assessment Lower Extremity Assessment: Generalized  weakness   Cervical / Trunk Assessment Cervical / Trunk Assessment: Kyphotic   Communication Communication Communication: No apparent difficulties   Cognition Arousal: Alert Behavior During Therapy: WFL for tasks assessed/performed Cognition: No family/caregiver present to determine baseline (slow processing; will further assess)                               Following commands: Intact       Cueing  General Comments   Cueing Techniques: Verbal cues  no signs of acute distress   Exercises     Shoulder Instructions      Home Living Family/patient expects to be discharged to:: Private residence Living Arrangements: Spouse/significant other Available Help at Discharge: Family;Available 24 hours/day (spouse is able to help 24/7 (has prosthetic) and children live closeby and can help if needed) Type of Home: House Home Access: Stairs to enter;Ramped entrance Entrance Stairs-Number of Steps: 3 Entrance Stairs-Rails: None Home Layout: One level     Bathroom Shower/Tub: Tub/shower unit;Curtain   Bathroom Toilet: Handicapped height Bathroom Accessibility: Yes How Accessible: Accessible via walker Home Equipment: Rollator (4 wheels);Rolling Walker (2 wheels);Wheelchair - manual;Wheelchair - power;Grab bars - tub/shower;Grab bars - toilet;Hand held shower head;Tub bench;BSC/3in1;Cane - single point;Other (comment) (stand up rollator)          Prior Functioning/Environment Prior Level of Function : Independent/Modified Independent;History of Falls (last six months)             Mobility Comments: mod I performing slide or stand-pivot to transfer (prefers to transfer to the R but can do both) manual wheelchair. ADLs Comments: mod I    OT Problem List: Decreased strength;Decreased range of motion;Decreased activity tolerance;Impaired balance (sitting and/or standing);Decreased safety awareness;Decreased cognition;Obesity;Pain   OT Treatment/Interventions:  Self-care/ADL training;Therapeutic exercise;DME and/or AE instruction;Therapeutic activities;Cognitive remediation/compensation;Balance training      OT Goals(Current goals can be found in the care plan section)   Acute Rehab OT Goals Patient Stated Goal: home and resume HH services OT Goal Formulation: With patient Time For Goal Achievement: 02/29/24   OT Frequency:  Min 2X/week    Co-evaluation              AM-PAC OT 6 Clicks Daily Activity     Outcome Measure Help from another person eating meals?: None Help from another person taking care of personal grooming?: A Little Help from another person toileting, which includes using toliet, bedpan, or urinal?: A Little Help from another person bathing (including washing, rinsing, drying)?: A Little Help from another person to put on and taking off regular upper body clothing?: A Little Help from another person to put on and taking off regular lower body clothing?: A Little 6 Click Score: 19   End of Session Equipment Utilized During Treatment: Gait belt;Rolling walker (2 wheels) Nurse Communication: Mobility status;Other (comment) (encourage use of BSC rather than Purewick)  Activity Tolerance: Patient tolerated treatment well Patient left: in bed;with call bell/phone within reach;with bed alarm set  OT Visit Diagnosis: Unsteadiness on feet (R26.81);Repeated falls (R29.6);Muscle weakness (generalized) (M62.81);Other symptoms and signs involving cognitive function;Pain  Pain - Right/Left:  (B) Pain - part of body: Knee                Time: 9076-9043 OT Time Calculation (min): 33 min Charges:  OT General Charges $OT Visit: 1 Visit OT Evaluation $OT Eval Moderate Complexity: 1 Mod OT Treatments $Self Care/Home Management : 8-22 mins  Kreg Sink, OT/L   Acute OT Clinical Specialist Acute Rehabilitation Services Pager [phone] Office [phone]   [facility] 02/15/2024, 10:05 AM

## 2024-02-16 DIAGNOSIS — K3533 Acute appendicitis with perforation and localized peritonitis, with abscess: Secondary | ICD-10-CM | POA: Diagnosis not present

## 2024-02-16 LAB — RENAL FUNCTION PANEL
Albumin: 1.8 g/dL — ABNORMAL LOW (ref 3.5–5.0)
Anion gap: 9 (ref 5–15)
BUN: 5 mg/dL — ABNORMAL LOW (ref 8–23)
CO2: 25 mmol/L (ref 22–32)
Calcium: 8.3 mg/dL — ABNORMAL LOW (ref 8.9–10.3)
Chloride: 106 mmol/L (ref 98–111)
Creatinine, Ser: 0.87 mg/dL (ref 0.44–1.00)
GFR, Estimated: >60 mL/min (ref >60)
Glucose, Bld: 107 mg/dL — ABNORMAL HIGH (ref 70–99)
Phosphorus: 2.9 mg/dL (ref 2.5–4.6)
Potassium: 3.7 mmol/L (ref 3.5–5.1)
Sodium: 140 mmol/L (ref 135–145)

## 2024-02-16 LAB — MAGNESIUM: Magnesium: 1.6 mg/dL — ABNORMAL LOW (ref 1.7–2.4)

## 2024-02-16 LAB — CBC
HCT: 22.8 % — ABNORMAL LOW (ref 36.0–46.0)
Hemoglobin: 7.7 g/dL — ABNORMAL LOW (ref 12.0–15.0)
MCH: 27.5 pg (ref 26.0–34.0)
MCHC: 33.8 g/dL (ref 30.0–36.0)
MCV: 81.4 fL (ref 80.0–100.0)
Platelets: 206 10*3/uL (ref 150–400)
RBC: 2.8 MIL/uL — ABNORMAL LOW (ref 3.87–5.11)
RDW: 16.5 % — ABNORMAL HIGH (ref 11.5–15.5)
WBC: 5.4 10*3/uL (ref 4.0–10.5)
nRBC: 0 % (ref 0.0–0.2)

## 2024-02-16 LAB — CULTURE, BLOOD (ROUTINE X 2)

## 2024-02-16 MED ORDER — ENSURE PLUS HIGH PROTEIN PO LIQD
237.0000 mL | Freq: Two times a day (BID) | ORAL | Status: DC
  Administered 2024-02-17 – 2024-02-19 (×4): 237 mL via ORAL

## 2024-02-16 MED ORDER — MAGNESIUM SULFATE 2 GM/50ML IV SOLN
2.0000 g | Freq: Once | INTRAVENOUS | Status: AC
  Administered 2024-02-16: 2 g via INTRAVENOUS
  Filled 2024-02-16: qty 50

## 2024-02-16 NOTE — Progress Notes (Signed)
 PROGRESS NOTE  [name] FMW:[id number] DOB: 27-Feb-1951   PCP: Jefferey Fitch, MD  Patient is from: Home.  DOA: 02/13/2024 LOS: 3  Chief complaints Chief Complaint  Patient presents with   CA Pt   Abnormal Labs   Hip Pain     Brief Narrative / Interim history: 73 year old F with PMH of breast cancer, diastolic CHF, PE on Eliquis , HTN and anxiety presenting with waxing and waning left hip pain for about 6 weeks that has acutely gotten worse, and admitted with possible SI joint osteomyelitis with possible abscess.  Patient had outpatient MRI of her pelvis the day prior to admission, and she was called by her provider to come to the ER due to concern for iliacus abscess and left-sided SI joint osteomyelitis.  Patient denies any recent fevers or chills, no dysuria, cough, no recent bacterial infection.  Apparently yesterday she was not acting herself and so her oncology provider was concerned about a UTI.  Urine culture with E. coli.   Orthopedic surgery, ID and IR consulted.  Ortho recommended medical treatment.  She underwent CT aspiration of left iliacus process by IR.  Started on daptomycin  and ceftriaxone  by ID.  Subjective: Seen and examined earlier this afternoon.  No major events this morning.  No complaints.   Objective: Vitals:   02/15/24 1833 02/15/24 2003 02/16/24 0419 02/16/24 0740  BP: (!) 96/59 (!) 101/59 101/60 130/66  Pulse: 72 75 71 73  Resp:  16 18 17   Temp:  99.1 F (37.3 C) 98.2 F (36.8 C) 98.3 F (36.8 C)  TempSrc:  Oral Oral Oral  SpO2:  96% 98% 98%  Weight:      Height:        Examination:  GENERAL: No apparent distress.  Nontoxic. HEENT: MMM.  Vision and hearing grossly intact.  NECK: Supple.  No apparent JVD.  RESP:  No IWOB.  Fair aeration bilaterally. CVS:  RRR. Heart sounds normal.  ABD/GI/GU: BS+. Abd soft, NTND.  MSK/EXT:  Moves extremities but limited by pain in left. No apparent deformity.  1+ BLE edema SKIN: no apparent skin  lesion or wound NEURO: AA.  Oriented x4 but a little confused.  No apparent focal neuro deficit although limited in left leg due to pain. PSYCH: Calm. Normal affect.   Consultants:  Infectious disease Orthopedic surgery Interventional radiology  Procedures: Aspiration of left iliacus process by IR  Microbiology summarized: Blood cultures with group B strep in 1/2 bottles Aspiration culture NGTD Urine culture with E. coli  Assessment and plan: SI joint septic arthritis/osteomyelitis, iliacus abscess-patient presents with waxing or waning left hip pain for about 6 weeks that has acutely gotten worse.  MRI left hip on 6/18 with moderate to severe marrow edema on either side of the left sacroiliac joint as well as joint effusion and small erosion concerning for septic arthritis/osteomyelitis.  MRI also showed 2 cm left iliacus fluid collection concerning for abscess. MRI lumbar spine with broad-based disc protrusion at L2-L3, L4-L5 and L5-S1 with mild to moderate foraminal stenosis.  Cultures as above.  CRP 2.1.  ESR 96.  No fever or leukocytosis.  Lactic acid negative. -S/p IR aspiration of left iliacus abscess. Abscess culture negative -On daptomycin  and ceftriaxone  per ID -No plan for surgical intervention from Ortho standpoint.  WBAT on LLE. -Pain control with bowel regimen -PT/OT.  Chronic diastolic CHF: TTE on 3/18 with LVEF of 55 to 60%.  Appears euvolemic on exam except for BLE edema.  Takes  Lasix  as needed for edema.  Normotensive. -Continue holding home Lasix , Aldactone  and losartan  -Reduced home Coreg  due to hypotension  Microcytic anemia/anemia of chronic disease: Hgb stable after initial drop.  No nutritional deficiency on anemia panel. Recent Labs    01/19/24 1529 01/20/24 0513 01/21/24 0210 01/21/24 1154 01/28/24 1122 02/12/24 1153 02/13/24 1148 02/14/24 0627 02/15/24 0411 02/16/24 1150  HGB 10.8* 11.0* 10.0* 9.9* 10.1* 9.6* 9.1* 7.8* 7.5* 7.7*  -Continue  monitoring   Metastatic breast cancer s/p mastectomy: No intracranial metastasis on MRI brain. -Outpatient follow-up   E. coli UTI: Urine culture on 6/18 with E. Coli.  Sensitive to ceftriaxone  -Antibiotics as above  Hypotension/history of hypertension: Hypotension resolved after decreasing Coreg  - Continue reduced dose of Coreg  -Continue holding Lasix , Aldactone  and losartan    History of PE - Resume Eliquis    GERD-Protonix    Hyperlipidemia-Crestor   Hypomagnesemia - Monitor replenish as appropriate.  At risk for polypharmacy: On multiple sedating medications. -Will review meds with patient  Class III obesity Body mass index is 39.86 kg/m. - Encourage lifestyle change to lose weight          DVT prophylaxis:  SCDs Start: 02/13/24 1418 apixaban  (ELIQUIS ) tablet 5 mg  Code Status: DNR Family Communication: Patient's husband at bedside but sleeping. Level of care: Med-Surg Status is: Inpatient Remains inpatient appropriate because: Septic arthritis/osteomyelitis   Final disposition: Likely home once medically stable   55 minutes with more than 50% spent in reviewing records, counseling patient/family and coordinating care.   Sch Meds:  Scheduled Meds:  apixaban   5 mg Oral BID   carvedilol   6.25 mg Oral BID WC   Chlorhexidine  Gluconate Cloth  6 each Topical Daily   DULoxetine   60 mg Oral Daily   gabapentin   300 mg Oral TID   pantoprazole   40 mg Oral Daily   rosuvastatin   10 mg Oral Daily   sodium chloride  flush  10-40 mL Intracatheter Q12H   temazepam   15 mg Oral QHS   Continuous Infusions:  cefTRIAXone  (ROCEPHIN )  IV 2 g (02/16/24 0854)   DAPTOmycin  700 mg (02/15/24 1327)   PRN Meds:.acetaminophen  **OR** acetaminophen , albuterol , ALPRAZolam , HYDROmorphone  (DILAUDID ) injection, ondansetron  **OR** ondansetron  (ZOFRAN ) IV, oxyCODONE , senna-docusate, sodium chloride  flush  Antimicrobials: Anti-infectives (From admission, onward)    Start     Dose/Rate  Route Frequency Ordered Stop   02/14/24 1400  cefTRIAXone  (ROCEPHIN ) 2 g in sodium chloride  0.9 % 100 mL IVPB        2 g 200 mL/hr over 30 Minutes Intravenous Daily 02/14/24 1207     02/14/24 1300  DAPTOmycin  (CUBICIN ) IVPB 700 mg/183mL premix        700 mg 200 mL/hr over 30 Minutes Intravenous Daily 02/14/24 1207     02/13/24 1445  cefTRIAXone  (ROCEPHIN ) 2 g in sodium chloride  0.9 % 100 mL IVPB  Status:  Discontinued        2 g 200 mL/hr over 30 Minutes Intravenous Every 24 hours 02/13/24 1430 02/13/24 1438        I have personally reviewed the following labs and images: CBC: Recent Labs  Lab 02/12/24 1153 02/13/24 1148 02/14/24 0627 02/15/24 0411 02/16/24 1150  WBC 10.3 9.6 6.9 6.6 5.4  NEUTROABS 5.6 5.2  --   --   --   HGB 9.6* 9.1* 7.8* 7.5* 7.7*  HCT 27.3* 27.3* 22.7* 22.1* 22.8*  MCV 78.2* 82.5 79.1* 81.0 81.4  PLT 260 234 205 198 206   BMP &GFR Recent Labs  Lab  02/12/24 1153 02/13/24 1148 02/14/24 0627 02/15/24 0411  NA 137 135 141 138  K 4.1 3.8 4.0 3.7  CL 102 103 107 108  CO2 26 21* 24 24  GLUCOSE 110* 102* 89 84  BUN 13 13 10 10   CREATININE 2.02* 1.52* 1.17* 1.16*  CALCIUM  8.8* 8.5* 8.7* 8.2*  MG 1.4*  --   --  1.4*  PHOS  --   --   --  3.6   Estimated Creatinine Clearance: 50 mL/min (A) (by C-G formula based on SCr of 1.16 mg/dL (H)). Liver & Pancreas: Recent Labs  Lab 02/12/24 1153 02/13/24 1148 02/15/24 0411  AST 11* 12*  --   ALT 5 8  --   ALKPHOS 83 75  --   BILITOT 0.7 1.2  --   PROT 7.5 7.2  --   ALBUMIN 3.0* 2.5* 1.8*   No results for input(s): LIPASE, AMYLASE in the last 168 hours. No results for input(s): AMMONIA in the last 168 hours. Diabetic: No results for input(s): HGBA1C in the last 72 hours. No results for input(s): GLUCAP in the last 168 hours. Cardiac Enzymes: Recent Labs  Lab 02/15/24 0411  CKTOTAL 22*   No results for input(s): PROBNP in the last 8760 hours. Coagulation Profile: Recent Labs  Lab  02/13/24 1312  INR 1.3*   Thyroid Function Tests: No results for input(s): TSH, T4TOTAL, FREET4, T3FREE, THYROIDAB in the last 72 hours. Lipid Profile: No results for input(s): CHOL, HDL, LDLCALC, TRIG, CHOLHDL, LDLDIRECT in the last 72 hours. Anemia Panel: Recent Labs    02/13/24 2144 02/14/24 0627  VITAMINB12 690  --   FOLATE  --  6.7  FERRITIN 167  --   TIBC 204*  --   IRON 59  --   RETICCTPCT  --  2.4   Urine analysis:    Component Value Date/Time   COLORURINE YELLOW 02/13/2024 1517   APPEARANCEUR CLOUDY (A) 02/13/2024 1517   LABSPEC 1.012 02/13/2024 1517   LABSPEC 1.020 10/28/2014 1142   PHURINE 5.0 02/13/2024 1517   GLUCOSEU NEGATIVE 02/13/2024 1517   GLUCOSEU Negative 10/28/2014 1142   HGBUR SMALL (A) 02/13/2024 1517   BILIRUBINUR NEGATIVE 02/13/2024 1517   BILIRUBINUR Negative 10/28/2014 1142   KETONESUR NEGATIVE 02/13/2024 1517   PROTEINUR NEGATIVE 02/13/2024 1517   UROBILINOGEN 0.2 10/28/2014 1142   NITRITE POSITIVE (A) 02/13/2024 1517   LEUKOCYTESUR LARGE (A) 02/13/2024 1517   LEUKOCYTESUR Trace 10/28/2014 1142   Sepsis Labs: Invalid input(s): PROCALCITONIN, LACTICIDVEN  Microbiology: Recent Results (from the past 240 hours)  Culture, Urine     Status: Abnormal   Collection Time: 02/12/24 11:30 AM   Specimen: Urine, Clean Catch  Result Value Ref Range Status   Specimen Description   Final    URINE, CLEAN CATCH Performed at [facility], [address] [place] [address], [place], [place] [address]    Special Requests   Final    NONE Performed at [facility], [address], [place], [place] [address]    Culture >=100,000 COLONIES/mL ESCHERICHIA COLI (A)  Final   Report Status 02/14/2024 FINAL  Final   Organism ID, Bacteria ESCHERICHIA COLI (A)  Final      Susceptibility   Escherichia coli - MIC*    AMPICILLIN >=32 RESISTANT Resistant     CEFAZOLIN >=64 RESISTANT Resistant     CEFEPIME   <=0.12 SENSITIVE Sensitive     CEFTRIAXONE  1 SENSITIVE Sensitive     CIPROFLOXACIN <=0.25 SENSITIVE Sensitive  GENTAMICIN <=1 SENSITIVE Sensitive     IMIPENEM <=0.25 SENSITIVE Sensitive     NITROFURANTOIN <=16 SENSITIVE Sensitive     TRIMETH/SULFA <=20 SENSITIVE Sensitive     AMPICILLIN/SULBACTAM >=32 RESISTANT Resistant     PIP/TAZO 8 SENSITIVE Sensitive ug/mL    * >=100,000 COLONIES/mL ESCHERICHIA COLI  Blood Culture (routine x 2)     Status: None (Preliminary result)   Collection Time: 02/13/24 12:48 PM   Specimen: BLOOD  Result Value Ref Range Status   Specimen Description   Final    BLOOD PORTA CATH Performed at [facility], [address], [place], [place] [address]    Special Requests   Final    BOTTLES DRAWN AEROBIC AND ANAEROBIC Blood Culture results may not be optimal due to an inadequate volume of blood received in culture bottles Performed at [facility], [address], [place], [place] [address]    Culture   Final    NO GROWTH 3 DAYS Performed at [facility], [address] [place], [place] [address]    Report Status PENDING  Incomplete  Urine Culture     Status: Abnormal   Collection Time: 02/13/24  3:17 PM   Specimen: Urine, Random  Result Value Ref Range Status   Specimen Description   Final    URINE, RANDOM Performed at [facility], [address], [place], [place] [address]    Special Requests   Final    NONE Reflexed from [id number] Performed at [facility], [address], [place], [place] [address]    Culture >=100,000 COLONIES/mL ESCHERICHIA COLI (A)  Final   Report Status 02/15/2024 FINAL  Final   Organism ID, Bacteria ESCHERICHIA COLI (A)  Final      Susceptibility   Escherichia coli - MIC*    AMPICILLIN >=32 RESISTANT Resistant     CEFAZOLIN 8 SENSITIVE Sensitive     CEFEPIME  <=0.12 SENSITIVE Sensitive     CEFTRIAXONE  0.5 SENSITIVE Sensitive      CIPROFLOXACIN <=0.25 SENSITIVE Sensitive     GENTAMICIN <=1 SENSITIVE Sensitive     IMIPENEM <=0.25 SENSITIVE Sensitive     NITROFURANTOIN <=16 SENSITIVE Sensitive     TRIMETH/SULFA <=20 SENSITIVE Sensitive     AMPICILLIN/SULBACTAM >=32 RESISTANT Resistant     PIP/TAZO 8 SENSITIVE Sensitive ug/mL    * >=100,000 COLONIES/mL ESCHERICHIA COLI  Blood Culture (routine x 2)     Status: Abnormal   Collection Time: 02/13/24  9:44 PM   Specimen: BLOOD RIGHT ARM  Result Value Ref Range Status   Specimen Description BLOOD RIGHT ARM  Final   Special Requests   Final    BOTTLES DRAWN AEROBIC AND ANAEROBIC Blood Culture results may not be optimal due to an inadequate volume of blood received in culture bottles   Culture  Setup Time   Final    GRAM POSITIVE COCCI IN CHAINS BOTTLES DRAWN AEROBIC ONLY CRITICAL RESULT CALLED TO, READ BACK BY AND VERIFIED WITH: PHARMD C. AMEND [phone] @ 1909 FH Performed at [facility], [address] [place], [place] [address]    Culture GROUP B STREP(S.AGALACTIAE)ISOLATED (A)  Final   Report Status 02/16/2024 FINAL  Final   Organism ID, Bacteria GROUP B STREP(S.AGALACTIAE)ISOLATED  Final      Susceptibility   Group b strep(s.agalactiae)isolated - MIC*    CLINDAMYCIN >=1 RESISTANT Resistant     AMPICILLIN <=0.25 SENSITIVE Sensitive     ERYTHROMYCIN 2 RESISTANT Resistant  VANCOMYCIN  0.5 SENSITIVE Sensitive     CEFTRIAXONE  <=0.12 SENSITIVE Sensitive     LEVOFLOXACIN 1 SENSITIVE Sensitive     PENICILLIN <=0.06 SENSITIVE Sensitive     * GROUP B STREP(S.AGALACTIAE)ISOLATED  Blood Culture ID Panel (Reflexed)     Status: Abnormal   Collection Time: 02/13/24  9:44 PM  Result Value Ref Range Status   Enterococcus faecalis NOT DETECTED NOT DETECTED Final   Enterococcus Faecium NOT DETECTED NOT DETECTED Final   Listeria monocytogenes NOT DETECTED NOT DETECTED Final   Staphylococcus species NOT DETECTED NOT DETECTED Final   Staphylococcus aureus (BCID) NOT  DETECTED NOT DETECTED Final   Staphylococcus epidermidis NOT DETECTED NOT DETECTED Final   Staphylococcus lugdunensis NOT DETECTED NOT DETECTED Final   Streptococcus species DETECTED (A) NOT DETECTED Final    Comment: CRITICAL RESULT CALLED TO, READ BACK BY AND VERIFIED WITH: PHARMD C. AMEND [phone] @ 1909 FH    Streptococcus agalactiae DETECTED (A) NOT DETECTED Final    Comment: CRITICAL RESULT CALLED TO, READ BACK BY AND VERIFIED WITH: PHARMD C. AMEND [phone] @ 1909 FH    Streptococcus pneumoniae NOT DETECTED NOT DETECTED Final   Streptococcus pyogenes NOT DETECTED NOT DETECTED Final   A.calcoaceticus-baumannii NOT DETECTED NOT DETECTED Final   Bacteroides fragilis NOT DETECTED NOT DETECTED Final   Enterobacterales NOT DETECTED NOT DETECTED Final   Enterobacter cloacae complex NOT DETECTED NOT DETECTED Final   Escherichia coli NOT DETECTED NOT DETECTED Final   Klebsiella aerogenes NOT DETECTED NOT DETECTED Final   Klebsiella oxytoca NOT DETECTED NOT DETECTED Final   Klebsiella pneumoniae NOT DETECTED NOT DETECTED Final   Proteus species NOT DETECTED NOT DETECTED Final   Salmonella species NOT DETECTED NOT DETECTED Final   Serratia marcescens NOT DETECTED NOT DETECTED Final   Haemophilus influenzae NOT DETECTED NOT DETECTED Final   Neisseria meningitidis NOT DETECTED NOT DETECTED Final   Pseudomonas aeruginosa NOT DETECTED NOT DETECTED Final   Stenotrophomonas maltophilia NOT DETECTED NOT DETECTED Final   Candida albicans NOT DETECTED NOT DETECTED Final   Candida auris NOT DETECTED NOT DETECTED Final   Candida glabrata NOT DETECTED NOT DETECTED Final   Candida krusei NOT DETECTED NOT DETECTED Final   Candida parapsilosis NOT DETECTED NOT DETECTED Final   Candida tropicalis NOT DETECTED NOT DETECTED Final   Cryptococcus neoformans/gattii NOT DETECTED NOT DETECTED Final    Comment: Performed at [facility], [address], [place], [place] [address]  Aerobic/Anaerobic Culture  w Gram Stain (surgical/deep wound)     Status: None (Preliminary result)   Collection Time: 02/14/24 11:56 AM   Specimen: Pelvis; Abscess  Result Value Ref Range Status   Specimen Description PELVIS  Final   Special Requests LEFT  Final   Gram Stain NO WBC SEEN NO ORGANISMS SEEN   Final   Culture   Final    NO GROWTH 2 DAYS NO ANAEROBES ISOLATED; CULTURE IN PROGRESS FOR 5 DAYS Performed at [facility], [address], [place], [place] [address]    Report Status PENDING  Incomplete    Radiology Studies: No results found.     Laddie Naeem T. Rayma Hegg [facility]  If 7PM-7AM, please contact night-coverage [email or web address] 02/16/2024, 12:12 PM

## 2024-02-16 NOTE — Progress Notes (Signed)
 Pelvic aspirate :  NO WBC SEEN NO ORGANISMS SEEN   Culture NO GROWTH < 24 HOURS   MRI L spine without infection noted  Continue dapto  + ctx

## 2024-02-17 ENCOUNTER — Other Ambulatory Visit (HOSPITAL_COMMUNITY)

## 2024-02-17 DIAGNOSIS — N390 Urinary tract infection, site not specified: Secondary | ICD-10-CM | POA: Diagnosis not present

## 2024-02-17 DIAGNOSIS — M0088 Arthritis due to other bacteria, vertebrae: Secondary | ICD-10-CM | POA: Diagnosis not present

## 2024-02-17 DIAGNOSIS — K3533 Acute appendicitis with perforation and localized peritonitis, with abscess: Secondary | ICD-10-CM | POA: Diagnosis not present

## 2024-02-17 DIAGNOSIS — M4628 Osteomyelitis of vertebra, sacral and sacrococcygeal region: Secondary | ICD-10-CM | POA: Diagnosis not present

## 2024-02-17 DIAGNOSIS — B951 Streptococcus, group B, as the cause of diseases classified elsewhere: Secondary | ICD-10-CM

## 2024-02-17 LAB — CBC
HCT: 24.2 % — ABNORMAL LOW (ref 36.0–46.0)
Hemoglobin: 8.2 g/dL — ABNORMAL LOW (ref 12.0–15.0)
MCH: 27.8 pg (ref 26.0–34.0)
MCHC: 33.9 g/dL (ref 30.0–36.0)
MCV: 82 fL (ref 80.0–100.0)
Platelets: 223 10*3/uL (ref 150–400)
RBC: 2.95 MIL/uL — ABNORMAL LOW (ref 3.87–5.11)
RDW: 16.5 % — ABNORMAL HIGH (ref 11.5–15.5)
WBC: 5.7 10*3/uL (ref 4.0–10.5)
nRBC: 0 % (ref 0.0–0.2)

## 2024-02-17 LAB — RENAL FUNCTION PANEL
Albumin: 2 g/dL — ABNORMAL LOW (ref 3.5–5.0)
Anion gap: 7 (ref 5–15)
BUN: <5 mg/dL — ABNORMAL LOW (ref 8–23)
CO2: 25 mmol/L (ref 22–32)
Calcium: 8.6 mg/dL — ABNORMAL LOW (ref 8.9–10.3)
Chloride: 108 mmol/L (ref 98–111)
Creatinine, Ser: 0.88 mg/dL (ref 0.44–1.00)
GFR, Estimated: >60 mL/min (ref >60)
Glucose, Bld: 89 mg/dL (ref 70–99)
Phosphorus: 3.2 mg/dL (ref 2.5–4.6)
Potassium: 3.5 mmol/L (ref 3.5–5.1)
Sodium: 140 mmol/L (ref 135–145)

## 2024-02-17 LAB — MAGNESIUM: Magnesium: 2 mg/dL (ref 1.7–2.4)

## 2024-02-17 MED ORDER — CARVEDILOL 12.5 MG PO TABS
12.5000 mg | ORAL_TABLET | Freq: Two times a day (BID) | ORAL | Status: DC
  Administered 2024-02-17 – 2024-02-18 (×3): 12.5 mg via ORAL
  Filled 2024-02-17 (×4): qty 1

## 2024-02-17 MED ORDER — DAPTOMYCIN-SODIUM CHLORIDE 700-0.9 MG/100ML-% IV SOLN
700.0000 mg | Freq: Every day | INTRAVENOUS | Status: DC
  Administered 2024-02-18 – 2024-02-20 (×3): 700 mg via INTRAVENOUS
  Filled 2024-02-17 (×3): qty 100

## 2024-02-17 NOTE — Progress Notes (Signed)
 [facility]  Date of Admission:  02/13/2024     Reason for Follow Up: Abscess of left iliac fossa  Total days of antibiotics 4         ASSESSMENT:  [name] blood cultures over the weekend are positive for group B streptococcus in the setting of left pelvic abscess and sacroiliac osteomyelitis.  Aspiration cultures remain without growth.  Discussed plan of care to continue current dose of ceftriaxone  and the need to repeat blood cultures to ensure clearance of bacteremia.  Will also check TTE for endocarditis to rule out a source of infection.  Anticipate prolonged course of antibiotics.  Tolerating antimicrobial therapy with no adverse side effects.  Universal/standard precautions.  Remaining medical and supportive care per internal medicine.  PLAN:  Continue current dose of ceftriaxone . Blood cultures for clearance of bacteremia. TTE to check for endocarditis. Monitor aspiration cultures for any organisms. Hold central line placement pending clearance of blood cultures. Universal/standard precautions. Remaining medical and supportive care per internal medicine.  Principal Problem:   Abscess of left iliac fossa Active Problems:   Essential hypertension   Anxiety   CHF (congestive heart failure) (HCC)   Breast cancer metastasized to multiple sites [facility])   Malignant neoplasm of both breasts in female, estrogen receptor negative (HCC)   DNR (do not resuscitate).DNI(Do Not Intubate)   Septic arthritis of sacroiliac joint (HCC)   Osteomyelitis (HCC)   Bacteremia due to group B Streptococcus    apixaban   5 mg Oral BID   carvedilol   12.5 mg Oral BID WC   Chlorhexidine  Gluconate Cloth  6 each Topical Daily   DULoxetine   60 mg Oral Daily   feeding supplement  237 mL Oral BID BM   gabapentin   300 mg Oral TID   pantoprazole   40 mg Oral Daily   rosuvastatin   10 mg Oral Daily   sodium chloride  flush  10-40 mL Intracatheter Q12H   temazepam   15 mg  Oral QHS    SUBJECTIVE:  Afebrile overnight with no acute events. Tolerating antibiotics with no adverse side effects. Denies fevers or chills. Family visiting at bedside.   Allergies  Allergen Reactions   Adhesive [Tape] Other (See Comments)    Tears the skin    Penicillins Hives and Other (See Comments)    PATIENT HAS TOLERATED Cephalosporins     Review of Systems: Review of Systems  Constitutional:  Negative for chills, fever and weight loss.  Respiratory:  Negative for cough, shortness of breath and wheezing.   Cardiovascular:  Negative for chest pain and leg swelling.  Gastrointestinal:  Negative for abdominal pain, constipation, diarrhea, nausea and vomiting.  Skin:  Negative for rash.    OBJECTIVE: Vitals:   02/17/24 0451 02/17/24 0718 02/17/24 0830 02/17/24 0940  BP: (!) 124/57 (!) 141/85 (!) 141/85 (!) 141/85  Pulse: 74 79 79 79  Resp: 18 16    Temp: 98.3 F (36.8 C) 99.2 F (37.3 C)    TempSrc: Oral Oral    SpO2: 97% 97%    Weight:      Height:       Body mass index is 39.86 kg/m.  Physical Exam Constitutional:      General: She is not in acute distress.    Appearance: She is well-developed.   Cardiovascular:     Rate and Rhythm: Normal rate and regular rhythm.     Heart sounds: Normal heart sounds.  Pulmonary:     Effort:  Pulmonary effort is normal.     Breath sounds: Normal breath sounds.   Skin:    General: Skin is warm and dry.   Neurological:     Mental Status: She is alert and oriented to person, place, and time.     Lab Results Lab Results  Component Value Date   WBC 5.7 02/17/2024   HGB 8.2 (L) 02/17/2024   HCT 24.2 (L) 02/17/2024   MCV 82.0 02/17/2024   PLT 223 02/17/2024    Lab Results  Component Value Date   CREATININE 0.88 02/17/2024   BUN <5 (L) 02/17/2024   NA 140 02/17/2024   K 3.5 02/17/2024   CL 108 02/17/2024   CO2 25 02/17/2024    Lab Results  Component Value Date   ALT 8 02/13/2024   AST 12 (L)  02/13/2024   ALKPHOS 75 02/13/2024   BILITOT 1.2 02/13/2024     Microbiology: Recent Results (from the past 240 hours)  Culture, Urine     Status: Abnormal   Collection Time: 02/12/24 11:30 AM   Specimen: Urine, Clean Catch  Result Value Ref Range Status   Specimen Description   Final    URINE, CLEAN CATCH Performed at [facility], [address], [place], [place] [address]    Special Requests   Final    NONE Performed at [facility], [address], [place], [place] [address]    Culture >=100,000 COLONIES/mL ESCHERICHIA COLI (A)  Final   Report Status 02/14/2024 FINAL  Final   Organism ID, Bacteria ESCHERICHIA COLI (A)  Final      Susceptibility   Escherichia coli - MIC*    AMPICILLIN >=32 RESISTANT Resistant     CEFAZOLIN >=64 RESISTANT Resistant     CEFEPIME  <=0.12 SENSITIVE Sensitive     CEFTRIAXONE  1 SENSITIVE Sensitive     CIPROFLOXACIN <=0.25 SENSITIVE Sensitive     GENTAMICIN <=1 SENSITIVE Sensitive     IMIPENEM <=0.25 SENSITIVE Sensitive     NITROFURANTOIN <=16 SENSITIVE Sensitive     TRIMETH/SULFA <=20 SENSITIVE Sensitive     AMPICILLIN/SULBACTAM >=32 RESISTANT Resistant     PIP/TAZO 8 SENSITIVE Sensitive ug/mL    * >=100,000 COLONIES/mL ESCHERICHIA COLI  Blood Culture (routine x 2)     Status: None (Preliminary result)   Collection Time: 02/13/24 12:48 PM   Specimen: BLOOD  Result Value Ref Range Status   Specimen Description   Final    BLOOD PORTA CATH Performed at [facility], [address] [place], [place] [address]    Special Requests   Final    BOTTLES DRAWN AEROBIC AND ANAEROBIC Blood Culture results may not be optimal due to an inadequate volume of blood received in culture bottles Performed at [facility], [address], [place], [place] [address]    Culture   Final    NO GROWTH 4 DAYS Performed at [facility], [address], [place], [place]  [address]    Report Status PENDING  Incomplete  Urine Culture     Status: Abnormal   Collection Time: 02/13/24  3:17 PM   Specimen: Urine, Random  Result Value Ref Range Status   Specimen Description   Final    URINE, RANDOM Performed at [facility], [address] [place], [place] [address]    Special Requests   Final    NONE Reflexed from [id number] Performed at [facility], [address] [place], [place] [address]    Culture >=100,000 COLONIES/mL ESCHERICHIA COLI (A)  Final   Report Status 02/15/2024 FINAL  Final   Organism ID, Bacteria ESCHERICHIA COLI (A)  Final      Susceptibility   Escherichia coli - MIC*    AMPICILLIN >=32 RESISTANT Resistant     CEFAZOLIN 8 SENSITIVE Sensitive     CEFEPIME  <=0.12 SENSITIVE Sensitive     CEFTRIAXONE  0.5 SENSITIVE Sensitive     CIPROFLOXACIN <=0.25 SENSITIVE Sensitive     GENTAMICIN <=1 SENSITIVE Sensitive     IMIPENEM <=0.25 SENSITIVE Sensitive     NITROFURANTOIN <=16 SENSITIVE Sensitive     TRIMETH/SULFA <=20 SENSITIVE Sensitive     AMPICILLIN/SULBACTAM >=32 RESISTANT Resistant     PIP/TAZO 8 SENSITIVE Sensitive ug/mL    * >=100,000 COLONIES/mL ESCHERICHIA COLI  Blood Culture (routine x 2)     Status: Abnormal   Collection Time: 02/13/24  9:44 PM   Specimen: BLOOD RIGHT ARM  Result Value Ref Range Status   Specimen Description BLOOD RIGHT ARM  Final   Special Requests   Final    BOTTLES DRAWN AEROBIC AND ANAEROBIC Blood Culture results may not be optimal due to an inadequate volume of blood received in culture bottles   Culture  Setup Time   Final    GRAM POSITIVE COCCI IN CHAINS BOTTLES DRAWN AEROBIC ONLY CRITICAL RESULT CALLED TO, READ BACK BY AND VERIFIED WITH: PHARMD C. AMEND [phone] @ 1909 FH Performed at [facility], [address] [place], [place] [address]    Culture GROUP B STREP(S.AGALACTIAE)ISOLATED (A)  Final   Report Status 02/16/2024 FINAL  Final   Organism ID, Bacteria  GROUP B STREP(S.AGALACTIAE)ISOLATED  Final      Susceptibility   Group b strep(s.agalactiae)isolated - MIC*    CLINDAMYCIN >=1 RESISTANT Resistant     AMPICILLIN <=0.25 SENSITIVE Sensitive     ERYTHROMYCIN 2 RESISTANT Resistant     VANCOMYCIN  0.5 SENSITIVE Sensitive     CEFTRIAXONE  <=0.12 SENSITIVE Sensitive     LEVOFLOXACIN 1 SENSITIVE Sensitive     PENICILLIN <=0.06 SENSITIVE Sensitive     * GROUP B STREP(S.AGALACTIAE)ISOLATED  Blood Culture ID Panel (Reflexed)     Status: Abnormal   Collection Time: 02/13/24  9:44 PM  Result Value Ref Range Status   Enterococcus faecalis NOT DETECTED NOT DETECTED Final   Enterococcus Faecium NOT DETECTED NOT DETECTED Final   Listeria monocytogenes NOT DETECTED NOT DETECTED Final   Staphylococcus species NOT DETECTED NOT DETECTED Final   Staphylococcus aureus (BCID) NOT DETECTED NOT DETECTED Final   Staphylococcus epidermidis NOT DETECTED NOT DETECTED Final   Staphylococcus lugdunensis NOT DETECTED NOT DETECTED Final   Streptococcus species DETECTED (A) NOT DETECTED Final    Comment: CRITICAL RESULT CALLED TO, READ BACK BY AND VERIFIED WITH: PHARMD C. AMEND [phone] @ 1909 FH    Streptococcus agalactiae DETECTED (A) NOT DETECTED Final    Comment: CRITICAL RESULT CALLED TO, READ BACK BY AND VERIFIED WITH: PHARMD C. AMEND [phone] @ 1909 FH    Streptococcus pneumoniae NOT DETECTED NOT DETECTED Final   Streptococcus pyogenes NOT DETECTED NOT DETECTED Final   A.calcoaceticus-baumannii NOT DETECTED NOT DETECTED Final   Bacteroides fragilis NOT DETECTED NOT DETECTED Final   Enterobacterales NOT DETECTED NOT DETECTED Final   Enterobacter cloacae complex NOT DETECTED NOT DETECTED Final   Escherichia coli NOT DETECTED NOT DETECTED Final   Klebsiella aerogenes NOT DETECTED NOT DETECTED Final   Klebsiella oxytoca NOT  DETECTED NOT DETECTED Final   Klebsiella pneumoniae NOT DETECTED NOT DETECTED Final   Proteus species NOT DETECTED NOT DETECTED Final    Salmonella species NOT DETECTED NOT DETECTED Final   Serratia marcescens NOT DETECTED NOT DETECTED Final   Haemophilus influenzae NOT DETECTED NOT DETECTED Final   Neisseria meningitidis NOT DETECTED NOT DETECTED Final   Pseudomonas aeruginosa NOT DETECTED NOT DETECTED Final   Stenotrophomonas maltophilia NOT DETECTED NOT DETECTED Final   Candida albicans NOT DETECTED NOT DETECTED Final   Candida auris NOT DETECTED NOT DETECTED Final   Candida glabrata NOT DETECTED NOT DETECTED Final   Candida krusei NOT DETECTED NOT DETECTED Final   Candida parapsilosis NOT DETECTED NOT DETECTED Final   Candida tropicalis NOT DETECTED NOT DETECTED Final   Cryptococcus neoformans/gattii NOT DETECTED NOT DETECTED Final    Comment: Performed at [facility], [address] [place], [place] [address]  Aerobic/Anaerobic Culture w Gram Stain (surgical/deep wound)     Status: None (Preliminary result)   Collection Time: 02/14/24 11:56 AM   Specimen: Pelvis; Abscess  Result Value Ref Range Status   Specimen Description PELVIS  Final   Special Requests LEFT  Final   Gram Stain NO WBC SEEN NO ORGANISMS SEEN   Final   Culture   Final    NO GROWTH 3 DAYS NO ANAEROBES ISOLATED; CULTURE IN PROGRESS FOR 5 DAYS Performed at [facility], [address] [place], [place] [address]    Report Status PENDING  Incomplete    I have personally spent 30 minutes involved in face-to-face and non-face-to-face activities for this patient on the day of the visit. Professional time spent includes the following activities: preparing to see the patient (review of tests), performing a medically appropriate examination, ordering medications/tests/procedures, communicating with other health care professionals, documenting clinical information in the EMR, communicating results to the patient/family, counseling and educating the patient/family and Care coordination.    Greg Yahir Tavano, NP [facility]  02/17/2024  12:43 PM

## 2024-02-17 NOTE — Plan of Care (Signed)

## 2024-02-17 NOTE — Plan of Care (Signed)
  Problem: Education: Goal: Knowledge of General Education information will improve Description: Including pain rating scale, medication(s)/side effects and non-pharmacologic comfort measures Outcome: Progressing   Problem: Clinical Measurements: Goal: Will remain free from infection Outcome: Progressing   Problem: Nutrition: Goal: Adequate nutrition will be maintained Outcome: Progressing   Problem: Pain Managment: Goal: General experience of comfort will improve and/or be controlled Outcome: Progressing   Problem: Safety: Goal: Ability to remain free from injury will improve Outcome: Progressing

## 2024-02-17 NOTE — Progress Notes (Signed)
 Occupational Therapy Treatment Patient Details Name: [name] MRN: [id number] DOB: [birth year] Today's Date: 02/17/2024   History of present illness 73 y.o. female presents to [facility] hospital on 02/13/2024 with L hip pain, admitted for possible SI osteomyelitis and abscess. CT guided aspiration of L iliacus on 6/20. PMH includes breast CA, PE, CHF, HTN, anxiety.   OT comments  Pt making steady progress towards OT goals this session. Pt continues to present with impaired balance impacting pts ability to complete ADLs independently . Session focus on functional mobility as precursor to higher level ADLS. Overall, pt requires supervision for UB/LB ADLS, supervision for bed mobility with increased time and use of bed features, and CGA for ambulatory ADL transfers with RW. Pt reliant on BUE support for dynamic balance and transfers. Education provided on fall prevention strategies for home with pt receptive to education. Pt ready to DC back home with assistance from her husband and resume HH therapies. Will continue to follow acutely for OT needs.        If plan is discharge home, recommend the following:  A little help with walking and/or transfers;A little help with bathing/dressing/bathroom;Assistance with cooking/housework;Direct supervision/assist for medications management;Direct supervision/assist for financial management;Assist for transportation;Help with stairs or ramp for entrance   Equipment Recommendations  None recommended by OT    Recommendations for Other Services      Precautions / Restrictions Precautions Precautions: Fall Recall of Precautions/Restrictions: Intact Restrictions Weight Bearing Restrictions Per Provider Order: Yes LLE Weight Bearing Per Provider Order: Weight bearing as tolerated       Mobility Bed Mobility Overal bed mobility: Needs Assistance Bed Mobility: Supine to Sit     Supine to sit: Supervision, HOB elevated, Used rails     General bed  mobility comments: heavy use of bed rails and + time    Transfers Overall transfer level: Needs assistance Equipment used: None, Rolling walker (2 wheels) Transfers: Sit to/from Stand Sit to Stand: Supervision Stand pivot transfers: Min assist         General transfer comment: pt completed stand pivot from EOB>recliner to pts L side with MIN A with no AD. impaired balance noted. pt noted to reach for recliner when transferring demonstrating impaired balance and noted unsteadiness without AD. recommended RW for ADL transfers     Balance Overall balance assessment: History of Falls, Needs assistance Sitting-balance support: No upper extremity supported, Feet supported Sitting balance-Leahy Scale: Good Sitting balance - Comments: sitting EOB   Standing balance support: Single extremity supported, During functional activity Standing balance-Leahy Scale: Poor Standing balance comment: reliant on external support                           ADL either performed or assessed with clinical judgement   ADL Overall ADL's : Needs assistance/impaired         Upper Body Bathing: Supervision/ safety;Sitting Upper Body Bathing Details (indicate cue type and reason): simulated Lower Body Bathing: Supervison/ safety;Sitting/lateral leans Lower Body Bathing Details (indicate cue type and reason): simulated from EOB Upper Body Dressing : Set up;Sitting Upper Body Dressing Details (indicate cue type and reason): to don backside gown Lower Body Dressing: Supervision/safety;Sitting/lateral leans Lower Body Dressing Details (indicate cue type and reason): simulated to pull socks up from recliner Toilet Transfer: Contact guard assist;Rolling walker (2 wheels);Ambulation;Cueing for safety Toilet Transfer Details (indicate cue type and reason): simulated via functional mobility, MIN cues for safety for RW mgmt  Tub/Shower Transfer Details (indicate cue type and reason): pt reports  tub transfer bench at home and has been using at baseline Functional mobility during ADLs: Contact guard assist;Rolling walker (2 wheels);Cueing for safety General ADL Comments: ADL participation impacted by pain and impaired balance    Extremity/Trunk Assessment Upper Extremity Assessment Upper Extremity Assessment: Generalized weakness   Lower Extremity Assessment Lower Extremity Assessment: Defer to PT evaluation   Cervical / Trunk Assessment Cervical / Trunk Assessment: Kyphotic    Vision Baseline Vision/History: 1 Wears glasses Ability to See in Adequate Light: 0 Adequate Vision Assessment?: No apparent visual deficits;Wears glasses for reading   Perception     Praxis     Communication Communication Communication: No apparent difficulties   Cognition Arousal: Alert Behavior During Therapy: WFL for tasks assessed/performed Cognition: No family/caregiver present to determine baseline             OT - Cognition Comments: slightly slow to process, but receptive to education                 Following commands: Intact        Cueing   Cueing Techniques: Verbal cues  Exercises      Shoulder Instructions       General Comments      Pertinent Vitals/ Pain       Pain Assessment Pain Assessment: No/denies pain  Home Living                                          Prior Functioning/Environment              Frequency  Min 2X/week        Progress Toward Goals  OT Goals(current goals can now be found in the care plan section)  Progress towards OT goals: Progressing toward goals  Acute Rehab OT Goals Patient Stated Goal: to resume HH OT Goal Formulation: With patient Time For Goal Achievement: 02/29/24  Plan      Co-evaluation                 AM-PAC OT 6 Clicks Daily Activity     Outcome Measure   Help from another person eating meals?: None Help from another person taking care of personal grooming?: A  Little   Help from another person bathing (including washing, rinsing, drying)?: A Little Help from another person to put on and taking off regular upper body clothing?: None Help from another person to put on and taking off regular lower body clothing?: None 6 Click Score: 18    End of Session Equipment Utilized During Treatment: Gait belt;Rolling walker (2 wheels)  OT Visit Diagnosis: Unsteadiness on feet (R26.81);Repeated falls (R29.6);Muscle weakness (generalized) (M62.81);Other symptoms and signs involving cognitive function   Activity Tolerance Patient tolerated treatment well   Patient Left in chair;with call bell/phone within reach;with chair alarm set   Nurse Communication Mobility status;Other (comment) (via secure chat)        Time: 9198-9176 OT Time Calculation (min): 22 min  Charges: OT General Charges $OT Visit: 1 Visit OT Treatments $Self Care/Home Management : 8-22 mins  Ronal Mallie POUR., COTA/L Acute Rehabilitation Services [phone]   Ronal Mallie Needy 02/17/2024, 8:57 AM

## 2024-02-17 NOTE — Progress Notes (Signed)
 Mobility Specialist Progress Note:    02/17/24 1007  Mobility  Activity Transferred from chair to bed  Level of Assistance Contact guard assist, steadying assist  Assistive Device None  Distance Ambulated (ft) 3 ft  LLE Weight Bearing Per Provider Order WBAT  Activity Response Tolerated well  Mobility Referral Yes  Mobility visit 1 Mobility  Mobility Specialist Start Time (ACUTE ONLY) [id number]  Mobility Specialist Stop Time (ACUTE ONLY) [id number]  Mobility Specialist Time Calculation (min) (ACUTE ONLY) 10 min   Pt received in chair requesting to get in bed. Upon standing from chair pt reached out for objects for stability to get back to bed. No c/o. No physical assistance needed. Personal belongings and call light within reach. All needs met. Bed alarm on.   Lavanda Pollack Mobility Specialist  Please contact via [employer] or  Rehab Office [phone]

## 2024-02-17 NOTE — Care Management Important Message (Signed)
 Important Message  Patient Details  Name: [name] MRN: [id number] Date of Birth: 1951/05/03   Important Message Given:  Yes - Medicare IM     Jon Cruel 02/17/2024, 1:11 PM

## 2024-02-17 NOTE — Progress Notes (Signed)
 PHARMACY CONSULT NOTE FOR:  OUTPATIENT  PARENTERAL ANTIBIOTIC THERAPY (OPAT)  Indication: L-iliac fossa abscess Regimen: Daptomycin  700 mg IV every 24 hours + Rocephin  2g IV every 24 hours End date: 03/30/24 (6 weeks from BCx clearance 6/23)  IV antibiotic discharge orders are pended. To discharging provider:  please sign these orders via discharge navigator,  Select New Orders & click on the button choice - Manage This Unsigned Work.     Thank you for allowing pharmacy to be a part of this patient's care.  Almarie Lunger, PharmD, BCPS, BCIDP Infectious Diseases Clinical Pharmacist 02/20/2024 11:34 AM   **Pharmacist phone directory can now be found on [email or web address] ([email or web address]).  Listed under [facility].

## 2024-02-17 NOTE — Progress Notes (Signed)
 PROGRESS NOTE  [name] FMW:[id number] DOB: [birth year]   PCP: Jefferey Fitch, MD  Patient is from: Home.  DOA: 02/13/2024 LOS: 4  Chief complaints Chief Complaint  Patient presents with   CA Pt   Abnormal Labs   Hip Pain     Brief Narrative / Interim history: 73 year old F with PMH of breast cancer, diastolic CHF, PE on Eliquis , HTN and anxiety presenting with waxing and waning left hip pain for about 6 weeks that has acutely gotten worse, and admitted with possible SI joint osteomyelitis with possible abscess.  Patient had outpatient MRI of her pelvis the day prior to admission, and she was called by her provider to come to the ER due to concern for iliacus abscess and left-sided SI joint osteomyelitis.  Patient denies any recent fevers or chills, no dysuria, cough, no recent bacterial infection.  Apparently yesterday she was not acting herself and so her oncology provider was concerned about a UTI.  Urine culture with E. coli.   Orthopedic surgery, ID and IR consulted.  Ortho recommended medical treatment.  She underwent CT aspiration of left iliacus process by IR.  Started on daptomycin  and ceftriaxone  by ID.  Blood culture with group B strep.  Antibiotic de-escalated to ceftriaxone .  Repeat blood culture and TTE ordered. ID following.  Subjective: Seen and examined earlier this afternoon.  No major events this morning.  No complaints.  Eager to go home.  Objective: Vitals:   02/17/24 0718 02/17/24 0830 02/17/24 0940 02/17/24 1609  BP: (!) 141/85 (!) 141/85 (!) 141/85 129/68  Pulse: 79 79 79 77  Resp: 16   16  Temp: 99.2 F (37.3 C)   98.8 F (37.1 C)  TempSrc: Oral   Oral  SpO2: 97%   96%  Weight:      Height:        Examination:  GENERAL: No apparent distress.  Nontoxic. HEENT: MMM.  Vision and hearing grossly intact.  NECK: Supple.  No apparent JVD.  RESP:  No IWOB.  Fair aeration bilaterally. CVS:  RRR. Heart sounds normal.  ABD/GI/GU: BS+. Abd  soft, NTND.  MSK/EXT:  Moves extremities but limited by pain in left. No apparent deformity.  1+ BLE edema SKIN: no apparent skin lesion or wound NEURO: AA.  Oriented x4. No apparent focal neuro deficit although limited in left leg due to pain. PSYCH: Calm. Normal affect.   Consultants:  Infectious disease Orthopedic surgery Interventional radiology  Procedures: Aspiration of left iliacus process by IR  Microbiology summarized: 6/19-blood cultures with group B strep in 1/2 bottles 6/20-aspiration culture NGTD 6/19-urine culture with E. Coli 6/23-repeat blood culture pending  Assessment and plan: SI joint septic arthritis/osteomyelitis, iliacus abscess and Streptococcus bacteremia-patient presents with waxing or waning left hip pain for about 6 weeks that has acutely gotten worse.  MRI left hip on 6/18 with moderate to severe marrow edema on either side of the left sacroiliac joint as well as joint effusion and small erosion concerning for septic arthritis/osteomyelitis.  MRI also showed 2 cm left iliacus fluid collection concerning for abscess. MRI lumbar spine with broad-based disc protrusion at L2-L3, L4-L5 and L5-S1 with mild to moderate foraminal stenosis.  Cultures as above.  CRP 2.1.  ESR 96.  No fever or leukocytosis.  Lactic acid negative. -S/p IR aspiration of left iliacus abscess. Abscess culture negative -Daptomycin  and ceftriaxone  6/19>>> ceftriaxone  6/23>>> -Follow repeat blood culture and TTE. -No plan for surgical intervention from Ortho standpoint.  WBAT on  LLE. -Pain control with bowel regimen -Appreciate guidance by ID -PT/OT.  Chronic diastolic CHF: TTE on 3/18 with LVEF of 55 to 60%.  Appears euvolemic on exam except for BLE edema.  Takes Lasix  as needed for edema.  Normotensive. -Continue holding home Lasix , Aldactone  and losartan  -Increase Coreg  to 12.5 mg daily now BP improved  Microcytic anemia/anemia of chronic disease: Hgb stable after initial drop.  No  nutritional deficiency on anemia panel.  Denies overt bleeding. Recent Labs    01/20/24 0513 01/21/24 0210 01/21/24 1154 01/28/24 1122 02/12/24 1153 02/13/24 1148 02/14/24 0627 02/15/24 0411 02/16/24 1150 02/17/24 0501  HGB 11.0* 10.0* 9.9* 10.1* 9.6* 9.1* 7.8* 7.5* 7.7* 8.2*  -Continue monitoring   Metastatic breast cancer s/p mastectomy: No intracranial metastasis on MRI brain. -Outpatient follow-up   E. coli UTI: Urine culture on 6/18 with E. Coli.  Sensitive to ceftriaxone  -Antibiotics as above  Hypotension/history of hypertension: Hypotension resolved after decreasing Coreg .  BP slightly elevated -Adjusted Coreg  as above. -Continue holding Lasix , Aldactone  and losartan    History of PE - Continue home Eliquis    GERD-Protonix    Hyperlipidemia-Crestor   Hypomagnesemia - Monitor replenish as appropriate.  At risk for polypharmacy: On multiple sedating medications. -Will review meds with patient  Class III obesity Body mass index is 39.86 kg/m. - Encourage lifestyle change to lose weight          DVT prophylaxis:  SCDs Start: 02/13/24 1418 apixaban  (ELIQUIS ) tablet 5 mg  Code Status: DNR Family Communication: None at bedside today. Level of care: Med-Surg Status is: Inpatient Remains inpatient appropriate because: Septic arthritis/osteomyelitis/Streptococcus bacteremia   Final disposition: Likely home once medically stable   55 minutes with more than 50% spent in reviewing records, counseling patient/family and coordinating care.   Sch Meds:  Scheduled Meds:  apixaban   5 mg Oral BID   carvedilol   12.5 mg Oral BID WC   Chlorhexidine  Gluconate Cloth  6 each Topical Daily   DULoxetine   60 mg Oral Daily   feeding supplement  237 mL Oral BID BM   gabapentin   300 mg Oral TID   pantoprazole   40 mg Oral Daily   rosuvastatin   10 mg Oral Daily   sodium chloride  flush  10-40 mL Intracatheter Q12H   temazepam   15 mg Oral QHS   Continuous Infusions:   cefTRIAXone  (ROCEPHIN )  IV 2 g (02/17/24 1013)   PRN Meds:.acetaminophen  **OR** acetaminophen , albuterol , ALPRAZolam , HYDROmorphone  (DILAUDID ) injection, ondansetron  **OR** ondansetron  (ZOFRAN ) IV, oxyCODONE , senna-docusate, sodium chloride  flush  Antimicrobials: Anti-infectives (From admission, onward)    Start     Dose/Rate Route Frequency Ordered Stop   02/14/24 1400  cefTRIAXone  (ROCEPHIN ) 2 g in sodium chloride  0.9 % 100 mL IVPB        2 g 200 mL/hr over 30 Minutes Intravenous Daily 02/14/24 1207     02/14/24 1300  DAPTOmycin  (CUBICIN ) IVPB 700 mg/100mL premix  Status:  Discontinued        700 mg 200 mL/hr over 30 Minutes Intravenous Daily 02/14/24 1207 02/17/24 1408   02/13/24 1445  cefTRIAXone  (ROCEPHIN ) 2 g in sodium chloride  0.9 % 100 mL IVPB  Status:  Discontinued        2 g 200 mL/hr over 30 Minutes Intravenous Every 24 hours 02/13/24 1430 02/13/24 1438        I have personally reviewed the following labs and images: CBC: Recent Labs  Lab 02/12/24 1153 02/13/24 1148 02/14/24 0627 02/15/24 0411 02/16/24 1150 02/17/24 0501  WBC 10.3  9.6 6.9 6.6 5.4 5.7  NEUTROABS 5.6 5.2  --   --   --   --   HGB 9.6* 9.1* 7.8* 7.5* 7.7* 8.2*  HCT 27.3* 27.3* 22.7* 22.1* 22.8* 24.2*  MCV 78.2* 82.5 79.1* 81.0 81.4 82.0  PLT 260 234 205 198 206 223   BMP &GFR Recent Labs  Lab 02/12/24 1153 02/13/24 1148 02/14/24 0627 02/15/24 0411 02/16/24 1150 02/17/24 0501  NA 137 135 141 138 140 140  K 4.1 3.8 4.0 3.7 3.7 3.5  CL 102 103 107 108 106 108  CO2 26 21* 24 24 25 25   GLUCOSE 110* 102* 89 84 107* 89  BUN 13 13 10 10  5* <5*  CREATININE 2.02* 1.52* 1.17* 1.16* 0.87 0.88  CALCIUM  8.8* 8.5* 8.7* 8.2* 8.3* 8.6*  MG 1.4*  --   --  1.4* 1.6* 2.0  PHOS  --   --   --  3.6 2.9 3.2   Estimated Creatinine Clearance: 66 mL/min (by C-G formula based on SCr of 0.88 mg/dL). Liver & Pancreas: Recent Labs  Lab 02/12/24 1153 02/13/24 1148 02/15/24 0411 02/16/24 1150 02/17/24 0501   AST 11* 12*  --   --   --   ALT 5 8  --   --   --   ALKPHOS 83 75  --   --   --   BILITOT 0.7 1.2  --   --   --   PROT 7.5 7.2  --   --   --   ALBUMIN 3.0* 2.5* 1.8* 1.8* 2.0*   No results for input(s): LIPASE, AMYLASE in the last 168 hours. No results for input(s): AMMONIA in the last 168 hours. Diabetic: No results for input(s): HGBA1C in the last 72 hours. No results for input(s): GLUCAP in the last 168 hours. Cardiac Enzymes: Recent Labs  Lab 02/15/24 0411  CKTOTAL 22*   No results for input(s): PROBNP in the last 8760 hours. Coagulation Profile: Recent Labs  Lab 02/13/24 1312  INR 1.3*   Thyroid Function Tests: No results for input(s): TSH, T4TOTAL, FREET4, T3FREE, THYROIDAB in the last 72 hours. Lipid Profile: No results for input(s): CHOL, HDL, LDLCALC, TRIG, CHOLHDL, LDLDIRECT in the last 72 hours. Anemia Panel: No results for input(s): VITAMINB12, FOLATE, FERRITIN, TIBC, IRON, RETICCTPCT in the last 72 hours.  Urine analysis:    Component Value Date/Time   COLORURINE YELLOW 02/13/2024 1517   APPEARANCEUR CLOUDY (A) 02/13/2024 1517   LABSPEC 1.012 02/13/2024 1517   LABSPEC 1.020 10/28/2014 1142   PHURINE 5.0 02/13/2024 1517   GLUCOSEU NEGATIVE 02/13/2024 1517   GLUCOSEU Negative 10/28/2014 1142   HGBUR SMALL (A) 02/13/2024 1517   BILIRUBINUR NEGATIVE 02/13/2024 1517   BILIRUBINUR Negative 10/28/2014 1142   KETONESUR NEGATIVE 02/13/2024 1517   PROTEINUR NEGATIVE 02/13/2024 1517   UROBILINOGEN 0.2 10/28/2014 1142   NITRITE POSITIVE (A) 02/13/2024 1517   LEUKOCYTESUR LARGE (A) 02/13/2024 1517   LEUKOCYTESUR Trace 10/28/2014 1142   Sepsis Labs: Invalid input(s): PROCALCITONIN, LACTICIDVEN  Microbiology: Recent Results (from the past 240 hours)  Culture, Urine     Status: Abnormal   Collection Time: 02/12/24 11:30 AM   Specimen: Urine, Clean Catch  Result Value Ref Range Status   Specimen Description    Final    URINE, CLEAN CATCH Performed at [facility], [address], [place], [place] [address]    Special Requests   Final    NONE Performed at [facility], [address] [place], [place] [address]    Culture >=100,000 COLONIES/mL ESCHERICHIA COLI (A)  Final   Report Status 02/14/2024 FINAL  Final   Organism ID, Bacteria ESCHERICHIA COLI (A)  Final      Susceptibility   Escherichia coli - MIC*    AMPICILLIN >=32 RESISTANT Resistant     CEFAZOLIN >=64 RESISTANT Resistant     CEFEPIME  <=0.12 SENSITIVE Sensitive     CEFTRIAXONE  1 SENSITIVE Sensitive     CIPROFLOXACIN <=0.25 SENSITIVE Sensitive     GENTAMICIN <=1 SENSITIVE Sensitive     IMIPENEM <=0.25 SENSITIVE Sensitive     NITROFURANTOIN <=16 SENSITIVE Sensitive     TRIMETH/SULFA <=20 SENSITIVE Sensitive     AMPICILLIN/SULBACTAM >=32 RESISTANT Resistant     PIP/TAZO 8 SENSITIVE Sensitive ug/mL    * >=100,000 COLONIES/mL ESCHERICHIA COLI  Blood Culture (routine x 2)     Status: None (Preliminary result)   Collection Time: 02/13/24 12:48 PM   Specimen: BLOOD  Result Value Ref Range Status   Specimen Description   Final    BLOOD PORTA CATH Performed at [facility], [address], [place], [place] [address]    Special Requests   Final    BOTTLES DRAWN AEROBIC AND ANAEROBIC Blood Culture results may not be optimal due to an inadequate volume of blood received in culture bottles Performed at [facility], [address] [place], [place] [address]    Culture   Final    NO GROWTH 4 DAYS Performed at [facility], [address] [place], [place] [address]    Report Status PENDING  Incomplete  Urine Culture     Status: Abnormal   Collection Time: 02/13/24  3:17 PM   Specimen: Urine, Random  Result Value Ref Range Status   Specimen Description   Final    URINE, RANDOM Performed at [facility], [address], [place], [place] [address]    Special Requests   Final    NONE Reflexed from [id number] Performed at [facility], [address], [place], [place] [address]    Culture >=100,000 COLONIES/mL ESCHERICHIA COLI (A)  Final   Report Status 02/15/2024 FINAL  Final   Organism ID, Bacteria ESCHERICHIA COLI (A)  Final      Susceptibility   Escherichia coli - MIC*    AMPICILLIN >=32 RESISTANT Resistant     CEFAZOLIN 8 SENSITIVE Sensitive     CEFEPIME  <=0.12 SENSITIVE Sensitive     CEFTRIAXONE  0.5 SENSITIVE Sensitive     CIPROFLOXACIN <=0.25 SENSITIVE Sensitive     GENTAMICIN <=1 SENSITIVE Sensitive     IMIPENEM <=0.25 SENSITIVE Sensitive     NITROFURANTOIN <=16 SENSITIVE Sensitive     TRIMETH/SULFA <=20 SENSITIVE Sensitive     AMPICILLIN/SULBACTAM >=32 RESISTANT Resistant     PIP/TAZO 8 SENSITIVE Sensitive ug/mL    * >=100,000 COLONIES/mL ESCHERICHIA COLI  Blood Culture (routine x 2)     Status: Abnormal   Collection Time: 02/13/24  9:44 PM   Specimen: BLOOD RIGHT ARM  Result Value Ref Range Status   Specimen Description BLOOD RIGHT ARM  Final   Special Requests   Final    BOTTLES DRAWN AEROBIC AND ANAEROBIC Blood Culture results may not be optimal due to an inadequate volume of blood received in culture bottles   Culture  Setup Time   Final    GRAM POSITIVE COCCI IN CHAINS BOTTLES DRAWN AEROBIC ONLY CRITICAL RESULT  CALLED TO, READ BACK BY AND VERIFIED WITH: PHARMD C. AMEND [phone] @ 1909 FH Performed at [facility], [address] [place], [place] [address]    Culture GROUP B STREP(S.AGALACTIAE)ISOLATED (A)  Final   Report Status 02/16/2024 FINAL  Final   Organism ID, Bacteria GROUP B STREP(S.AGALACTIAE)ISOLATED  Final      Susceptibility   Group b strep(s.agalactiae)isolated - MIC*    CLINDAMYCIN >=1 RESISTANT Resistant     AMPICILLIN <=0.25 SENSITIVE Sensitive     ERYTHROMYCIN 2 RESISTANT Resistant     VANCOMYCIN  0.5 SENSITIVE Sensitive     CEFTRIAXONE   <=0.12 SENSITIVE Sensitive     LEVOFLOXACIN 1 SENSITIVE Sensitive     PENICILLIN <=0.06 SENSITIVE Sensitive     * GROUP B STREP(S.AGALACTIAE)ISOLATED  Blood Culture ID Panel (Reflexed)     Status: Abnormal   Collection Time: 02/13/24  9:44 PM  Result Value Ref Range Status   Enterococcus faecalis NOT DETECTED NOT DETECTED Final   Enterococcus Faecium NOT DETECTED NOT DETECTED Final   Listeria monocytogenes NOT DETECTED NOT DETECTED Final   Staphylococcus species NOT DETECTED NOT DETECTED Final   Staphylococcus aureus (BCID) NOT DETECTED NOT DETECTED Final   Staphylococcus epidermidis NOT DETECTED NOT DETECTED Final   Staphylococcus lugdunensis NOT DETECTED NOT DETECTED Final   Streptococcus species DETECTED (A) NOT DETECTED Final    Comment: CRITICAL RESULT CALLED TO, READ BACK BY AND VERIFIED WITH: PHARMD C. AMEND [phone] @ 1909 FH    Streptococcus agalactiae DETECTED (A) NOT DETECTED Final    Comment: CRITICAL RESULT CALLED TO, READ BACK BY AND VERIFIED WITH: PHARMD C. AMEND [phone] @ 1909 FH    Streptococcus pneumoniae NOT DETECTED NOT DETECTED Final   Streptococcus pyogenes NOT DETECTED NOT DETECTED Final   A.calcoaceticus-baumannii NOT DETECTED NOT DETECTED Final   Bacteroides fragilis NOT DETECTED NOT DETECTED Final   Enterobacterales NOT DETECTED NOT DETECTED Final   Enterobacter cloacae complex NOT DETECTED NOT DETECTED Final   Escherichia coli NOT DETECTED NOT DETECTED Final   Klebsiella aerogenes NOT DETECTED NOT DETECTED Final   Klebsiella oxytoca NOT DETECTED NOT DETECTED Final   Klebsiella pneumoniae NOT DETECTED NOT DETECTED Final   Proteus species NOT DETECTED NOT DETECTED Final   Salmonella species NOT DETECTED NOT DETECTED Final   Serratia marcescens NOT DETECTED NOT DETECTED Final   Haemophilus influenzae NOT DETECTED NOT DETECTED Final   Neisseria meningitidis NOT DETECTED NOT DETECTED Final   Pseudomonas aeruginosa NOT DETECTED NOT DETECTED Final    Stenotrophomonas maltophilia NOT DETECTED NOT DETECTED Final   Candida albicans NOT DETECTED NOT DETECTED Final   Candida auris NOT DETECTED NOT DETECTED Final   Candida glabrata NOT DETECTED NOT DETECTED Final   Candida krusei NOT DETECTED NOT DETECTED Final   Candida parapsilosis NOT DETECTED NOT DETECTED Final   Candida tropicalis NOT DETECTED NOT DETECTED Final   Cryptococcus neoformans/gattii NOT DETECTED NOT DETECTED Final    Comment: Performed at [facility], [address] [place], [place] [address]  Aerobic/Anaerobic Culture w Gram Stain (surgical/deep wound)     Status: None (Preliminary result)   Collection Time: 02/14/24 11:56 AM   Specimen: Pelvis; Abscess  Result Value Ref Range Status   Specimen Description PELVIS  Final   Special Requests LEFT  Final   Gram Stain NO WBC SEEN NO ORGANISMS SEEN   Final   Culture   Final    NO GROWTH 3 DAYS NO ANAEROBES ISOLATED; CULTURE IN PROGRESS FOR 5  DAYS Performed at [facility], [address], [place], [place] [address]    Report Status PENDING  Incomplete    Radiology Studies: No results found.     Bernadine Melecio T. Vickie Melnik [facility]  If 7PM-7AM, please contact night-coverage [email or web address] 02/17/2024, 4:34 PM

## 2024-02-18 ENCOUNTER — Inpatient Hospital Stay (HOSPITAL_COMMUNITY)

## 2024-02-18 DIAGNOSIS — M4628 Osteomyelitis of vertebra, sacral and sacrococcygeal region: Secondary | ICD-10-CM | POA: Diagnosis not present

## 2024-02-18 DIAGNOSIS — E785 Hyperlipidemia, unspecified: Secondary | ICD-10-CM | POA: Insufficient documentation

## 2024-02-18 DIAGNOSIS — M0088 Arthritis due to other bacteria, vertebrae: Secondary | ICD-10-CM | POA: Diagnosis not present

## 2024-02-18 DIAGNOSIS — N390 Urinary tract infection, site not specified: Secondary | ICD-10-CM | POA: Diagnosis not present

## 2024-02-18 DIAGNOSIS — K3533 Acute appendicitis with perforation and localized peritonitis, with abscess: Secondary | ICD-10-CM | POA: Diagnosis not present

## 2024-02-18 DIAGNOSIS — R7881 Bacteremia: Secondary | ICD-10-CM

## 2024-02-18 LAB — CULTURE, BLOOD (ROUTINE X 2): Culture: NO GROWTH

## 2024-02-18 LAB — ECHOCARDIOGRAM COMPLETE
AR max vel: 2.09 cm2
AV Area VTI: 2.22 cm2
AV Area mean vel: 2.28 cm2
AV Mean grad: 6 mmHg
AV Peak grad: 10.9 mmHg
Ao pk vel: 1.65 m/s
Area-P 1/2: 4.06 cm2
Height: 63 in
S' Lateral: 3 cm
Weight: 3600 [oz_av]

## 2024-02-18 MED ORDER — OXYCODONE-ACETAMINOPHEN 7.5-325 MG PO TABS
1.0000 | ORAL_TABLET | ORAL | Status: DC | PRN
  Administered 2024-02-18 – 2024-02-20 (×6): 1 via ORAL
  Filled 2024-02-18 (×5): qty 1

## 2024-02-18 NOTE — Progress Notes (Signed)
 Mobility Specialist: Progress Note   02/18/24 1041  Mobility  Activity Refused mobility  Mobility Specialist Start Time (ACUTE ONLY) 1042    Pt refused mobility today. Stated she was having a pain flare-up in her L hip that was radiating through her LLE. Pt declined all mobility efforts even with premedication. Left in bed, all needs met.  [name] Mobility Specialist Please contact via SecureChat or Rehab office at [phone]

## 2024-02-18 NOTE — TOC Progression Note (Addendum)
 Transition of Care [facility]) - Progression Note    Patient Details  Name: [name] MRN: [id number] Date of Birth: 1951/01/08  [facility] of [facility] CM/SW Contact  Stephane Powell Jansky, RN Phone Number: 02/18/2024, 9:36 AM  Clinical Narrative:     Received a message that patient will need IV ABX at home at discharge.   NCM spoke to patient at bedside. Explained ID has consulted [employer] for infusion company.   Patient stated she was unable she would need IV ABX at home.   NCM explained she will have a HHRN however HHRN will not be present every time a dose is due. Pam with [employer] will provide her and her family education at hospital bedside prior to discharge. Patient voiced understanding and stated she has had IV ABX at home before.   Patient currently active with [facility] for home health. Unfortunately , [facility] does not pay for IV supplies . Estimated cost of IV supplies is $20.00. Confirmed with Channing with [facility] and Pam with [employer]. However, [facility] and [facility] use their medicare supply fund payment to support patient.   Provided medicare.gov list of home health agencies with ratings. Patient has had [facility] in the past and would like them again . Shylise accepted referral.   Per ID anticipated discharge date is Thursday. Donette and Pam aware   Channing with [facility] aware also   Expected Discharge Plan: Home w Home Health Services Barriers to Discharge: Continued Medical Work up  Expected Discharge Plan and Services   Discharge Planning Services: CM Consult Post Acute Care Choice: Home Health Living arrangements for the past 2 months: Single Family Home                 DME Arranged: N/A DME Agency: NA       HH Arranged: PT HH Agency: [facility] Date HH Agency Contacted: 02/14/24 Time HH Agency Contacted: 1246 Representative spoke with at [facility] Agency: left Channing a message awaiting call back   Social Determinants of Health (SDOH)  Interventions SDOH Screenings   Food Insecurity: No Food Insecurity (02/14/2024)  Housing: Low Risk  (02/14/2024)  Transportation Needs: No Transportation Needs (02/14/2024)  Utilities: Not At Risk (02/14/2024)  Social Connections: Moderately Isolated (02/14/2024)  Tobacco Use: Low Risk  (02/14/2024)    Readmission Risk Interventions    02/14/2024   12:50 PM 01/21/2024    1:47 PM  Readmission Risk Prevention Plan  Transportation Screening Complete Complete  PCP or Specialist Appt within 3-5 Days  Complete  HRI or Home Care Consult  Complete  Social Work Consult for Recovery Care Planning/Counseling  Complete  Palliative Care Screening  Not Applicable  Medication Review [name]) Complete Complete  PCP or Specialist appointment within 3-5 days of discharge Complete   HRI or Home Care Consult Complete

## 2024-02-18 NOTE — Assessment & Plan Note (Signed)
-   Supplemented and resolved 

## 2024-02-18 NOTE — Assessment & Plan Note (Signed)
 Hgb stable - Weekly CBC on Rocephin 

## 2024-02-18 NOTE — Assessment & Plan Note (Signed)
-   Continue duloxetine , Xanax

## 2024-02-18 NOTE — Assessment & Plan Note (Signed)
 Continue Crestor

## 2024-02-18 NOTE — Plan of Care (Signed)

## 2024-02-18 NOTE — Hospital Course (Addendum)
 73 y.o. F with BrCA metastatic to liver and bone, on Trastuzumab , MO, dCHF, hx PE on Eliquis , and HTN who presented with 6 weeks left hip pain, found to have likely iliacus abscess and left SI joint osteo.  Admitted and underwent aspiration of abscess, culture NG.  ID consulted, started on daptomycin  and Rocephin .  Blood cultures growing GBS in 1/4, urine culture growing E coli.    TTE negative.  Repeat cultures pending.

## 2024-02-18 NOTE — Assessment & Plan Note (Addendum)
 Group B strep bacteremia Sacral osteomyelitis Outpatient MRI showed left SI joint septic arthritis with adjacent osteomyelitis and 2 cm adjacent fluid collection concerning for abscess.  Admitted and underwent CT-guided aspiration, culture negative.  Orthopedics consulted, recommended against debridement.  ID consulted, TTE negative, blood culture with 1 of 4 with GBS, unclear significance. - Continue Rocephin  and daptomycin  -Follow-up 6/23 blood culture

## 2024-02-18 NOTE — Plan of Care (Signed)

## 2024-02-18 NOTE — Progress Notes (Signed)
  Progress Note   Patient: [name] FMW:[id number] DOB: 1951-07-23 DOA: 02/13/2024     5 DOS: the patient was seen and examined on 02/18/2024 at 9:50 AM      Brief hospital course: 73 y.o. F with BrCA metastatic to liver and bone, on Trastuzumab , MO, dCHF, hx PE on Eliquis , and HTN who presented with 6 weeks left hip pain, found to have likely iliacus abscess and left SI joint osteo.  Admitted and underwent aspiration of abscess, culture NG.  ID consulted, started on daptomycin  and Rocephin .  Blood cultures growing GBS in 1/4, urine culture growing E coli.    TTE negative.  Repeat cultures pending.     Assessment and Plan: Anemia of chronic disease Hgb stable - Weekly CBC on Rocephin   Hypomagnesemia Supplemented and resolved  History of pulmonary embolism - Continue Eliquis   Obesity, Class III, BMI 40-49.9 (morbid obesity) History of class III obesity, complicates care  Breast cancer metastasized to multiple sites [facility]) On Trastuzumab   Essential hypertension BP soft - Hold Coreg  - Hold spironolactone , losartan , furosemide , amlodipine   Septic arthritis of sacroiliac joint (HCC) Group B strep bacteremia Sacral osteomyelitis Outpatient MRI showed left SI joint septic arthritis with adjacent osteomyelitis and 2 cm adjacent fluid collection concerning for abscess.  Admitted and underwent CT-guided aspiration, culture negative.  Orthopedics consulted, recommended against debridement.  ID consulted, TTE negative, blood culture with 1 of 4 with GBS, unclear significance. - Continue Rocephin  and daptomycin  -Follow-up 6/23 blood culture    Hyperlipidemia - Continue Crestor   Chronic diastolic CHF (congestive heart failure) (HCC) Appears euvolemic - Hold Coreg , spironolactone , furosemide , losartan   Anxiety - Continue duloxetine , Xanax           Subjective: Patient feeling okay, she still has some left-sided hip pain.  No fever, no confusion, no  respiratory symptoms.     Physical Exam: BP (!) 96/47 (BP Location: Right Wrist)   Pulse 72   Temp 97.8 F (36.6 C) (Oral)   Resp 16   Ht 5' 3 (1.6 m)   Wt 102.1 kg   SpO2 93%   BMI 39.86 kg/m   Adult female, lying in bed, interactive and appropriate RRR, no murmurs, no peripheral edema Respiratory rate normal, lungs clear without rales or wheezes Abdomen soft no tenderness palpation or guarding Attention normal, affect pleasant, judgment and insight appear normal, face symmetric, generalized weakness but symmetric strength    Data Reviewed: Basic metabolic panel unremarkable CBC shows stable anemia Discussed with infectious disease   Family communication: Husband and grandson at the bedside    Disposition: Status is: Inpatient         Author: Lonni SHAUNNA Dalton, MD 02/18/2024 5:02 PM  For on call review [email or web address].

## 2024-02-18 NOTE — Assessment & Plan Note (Signed)
 On Trastuzumab 

## 2024-02-18 NOTE — Assessment & Plan Note (Signed)
 Continue Eliquis .

## 2024-02-18 NOTE — Assessment & Plan Note (Addendum)
 BP soft - Hold Coreg  - Hold spironolactone , losartan , furosemide , amlodipine 

## 2024-02-18 NOTE — Assessment & Plan Note (Signed)
 Appears euvolemic - Hold Coreg , spironolactone , furosemide , losartan 

## 2024-02-18 NOTE — Progress Notes (Signed)
 [facility]  Date of Admission:  02/13/2024     Reason for Follow Up: Abscess of left iliac fossa  Total days of antibiotics 5         ASSESSMENT:  [name] blood cultures from 02/17/2024 are without growth in less than 24 hours in the setting of left pelvic abscess and sacroiliac osteomyelitis.  TTE was negative for endocarditis/vegetations although aortic valve had limited evaluation.  Continue current dose of ceftriaxone  and daptomycin .  Therapeutic drug monitoring of CK levels while on daptomycin .  Monitor blood cultures for clearance of bacteremia.  Will need at least 6 weeks of IV antibiotics with PICC line placement pending clearance of blood cultures.  Tolerating antibiotics with no adverse side effects.  Continue universal/standard precautions.  Remaining medical and supportive care per internal medicine.  PLAN:  Continue current dose of daptomycin  and ceftriaxone . Therapeutic drug monitoring of CK levels per protocol. Monitor blood cultures for clearance of bacteremia. Hold PICC line placement pending clearance of bacteremia. Universal/standard precautions. Remaining medical and supportive care per internal medicine.  Principal Problem:   Abscess of left iliac fossa Active Problems:   Essential hypertension   Anxiety   CHF (congestive heart failure) (HCC)   Breast cancer metastasized to multiple sites [facility])   Malignant neoplasm of both breasts in female, estrogen receptor negative (HCC)   DNR (do not resuscitate).DNI(Do Not Intubate)   Septic arthritis of sacroiliac joint (HCC)   Osteomyelitis (HCC)   Bacteremia due to group B Streptococcus    apixaban   5 mg Oral BID   carvedilol   12.5 mg Oral BID WC   Chlorhexidine  Gluconate Cloth  6 each Topical Daily   DULoxetine   60 mg Oral Daily   feeding supplement  237 mL Oral BID BM   gabapentin   300 mg Oral TID   pantoprazole   40 mg Oral Daily   rosuvastatin   10 mg Oral Daily   sodium  chloride flush  10-40 mL Intracatheter Q12H   temazepam   15 mg Oral QHS    SUBJECTIVE:  Afebrile overnight with no acute events.  Tolerating antibiotics with no adverse side effects.  Has questions about how she obtained this infection.  Allergies  Allergen Reactions   Adhesive [Tape] Other (See Comments)    Tears the skin    Penicillins Hives and Other (See Comments)    PATIENT HAS TOLERATED Cephalosporins     Review of Systems: Review of Systems  Constitutional:  Negative for chills, fever and weight loss.  Respiratory:  Negative for cough, shortness of breath and wheezing.   Cardiovascular:  Negative for chest pain and leg swelling.  Gastrointestinal:  Negative for abdominal pain, constipation, diarrhea, nausea and vomiting.  Skin:  Negative for rash.      OBJECTIVE: Vitals:   02/17/24 1636 02/17/24 2105 02/18/24 0405 02/18/24 0733  BP: 129/68 133/68 (!) 123/47 109/60  Pulse: 77 70 80 83  Resp:  18 18 17   Temp:  98 F (36.7 C) 98.6 F (37 C) 98.4 F (36.9 C)  TempSrc:  Oral Oral Oral  SpO2:  98% 97% 97%  Weight:      Height:       Body mass index is 39.86 kg/m.  Physical Exam Constitutional:      General: She is not in acute distress.    Appearance: She is well-developed.   Cardiovascular:     Rate and Rhythm: Normal rate and regular rhythm.     Heart  sounds: Normal heart sounds.  Pulmonary:     Effort: Pulmonary effort is normal.     Breath sounds: Normal breath sounds.   Skin:    General: Skin is warm and dry.   Neurological:     Mental Status: She is alert and oriented to person, place, and time.     Lab Results Lab Results  Component Value Date   WBC 5.7 02/17/2024   HGB 8.2 (L) 02/17/2024   HCT 24.2 (L) 02/17/2024   MCV 82.0 02/17/2024   PLT 223 02/17/2024    Lab Results  Component Value Date   CREATININE 0.88 02/17/2024   BUN <5 (L) 02/17/2024   NA 140 02/17/2024   K 3.5 02/17/2024   CL 108 02/17/2024   CO2 25 02/17/2024     Lab Results  Component Value Date   ALT 8 02/13/2024   AST 12 (L) 02/13/2024   ALKPHOS 75 02/13/2024   BILITOT 1.2 02/13/2024     Microbiology: Recent Results (from the past 240 hours)  Culture, Urine     Status: Abnormal   Collection Time: 02/12/24 11:30 AM   Specimen: Urine, Clean Catch  Result Value Ref Range Status   Specimen Description   Final    URINE, CLEAN CATCH Performed at [facility], [address], [place], [place] [address]    Special Requests   Final    NONE Performed at [facility], [address] [place], [place] [address]    Culture >=100,000 COLONIES/mL ESCHERICHIA COLI (A)  Final   Report Status 02/14/2024 FINAL  Final   Organism ID, Bacteria ESCHERICHIA COLI (A)  Final      Susceptibility   Escherichia coli - MIC*    AMPICILLIN >=32 RESISTANT Resistant     CEFAZOLIN >=64 RESISTANT Resistant     CEFEPIME  <=0.12 SENSITIVE Sensitive     CEFTRIAXONE  1 SENSITIVE Sensitive     CIPROFLOXACIN <=0.25 SENSITIVE Sensitive     GENTAMICIN <=1 SENSITIVE Sensitive     IMIPENEM <=0.25 SENSITIVE Sensitive     NITROFURANTOIN <=16 SENSITIVE Sensitive     TRIMETH/SULFA <=20 SENSITIVE Sensitive     AMPICILLIN/SULBACTAM >=32 RESISTANT Resistant     PIP/TAZO 8 SENSITIVE Sensitive ug/mL    * >=100,000 COLONIES/mL ESCHERICHIA COLI  Blood Culture (routine x 2)     Status: None   Collection Time: 02/13/24 12:48 PM   Specimen: BLOOD  Result Value Ref Range Status   Specimen Description   Final    BLOOD PORTA CATH Performed at [facility], [address], [place], [place] [address]    Special Requests   Final    BOTTLES DRAWN AEROBIC AND ANAEROBIC Blood Culture results may not be optimal due to an inadequate volume of blood received in culture bottles Performed at [facility], [address], [place], [place] [address]    Culture   Final    NO GROWTH 5 DAYS Performed at [facility], [address], [place], [place] [address]    Report Status 02/18/2024 FINAL  Final  Urine Culture     Status: Abnormal   Collection Time: 02/13/24  3:17 PM   Specimen: Urine, Random  Result Value Ref Range Status   Specimen Description   Final    URINE, RANDOM Performed at [facility], [address], [place], [place] [address]    Special Requests   Final    NONE Reflexed  from [id number] Performed at [facility], [name], [place], [place] [address]    Culture >=100,000 COLONIES/mL ESCHERICHIA COLI (A)  Final   Report Status 02/15/2024 FINAL  Final   Organism ID, Bacteria ESCHERICHIA COLI (A)  Final      Susceptibility   Escherichia coli - MIC*    AMPICILLIN >=32 RESISTANT Resistant     CEFAZOLIN 8 SENSITIVE Sensitive     CEFEPIME  <=0.12 SENSITIVE Sensitive     CEFTRIAXONE  0.5 SENSITIVE Sensitive     CIPROFLOXACIN <=0.25 SENSITIVE Sensitive     GENTAMICIN <=1 SENSITIVE Sensitive     IMIPENEM <=0.25 SENSITIVE Sensitive     NITROFURANTOIN <=16 SENSITIVE Sensitive     TRIMETH/SULFA <=20 SENSITIVE Sensitive     AMPICILLIN/SULBACTAM >=32 RESISTANT Resistant     PIP/TAZO 8 SENSITIVE Sensitive ug/mL    * >=100,000 COLONIES/mL ESCHERICHIA COLI  Blood Culture (routine x 2)     Status: Abnormal   Collection Time: 02/13/24  9:44 PM   Specimen: BLOOD RIGHT ARM  Result Value Ref Range Status   Specimen Description BLOOD RIGHT ARM  Final   Special Requests   Final    BOTTLES DRAWN AEROBIC AND ANAEROBIC Blood Culture results may not be optimal due to an inadequate volume of blood received in culture bottles   Culture  Setup Time   Final    GRAM POSITIVE COCCI IN CHAINS BOTTLES DRAWN AEROBIC ONLY CRITICAL RESULT CALLED TO, READ BACK BY AND VERIFIED WITH: PHARMD C. AMEND [phone] @ 1909 FH Performed at [facility], [address] [place], [place] [address]    Culture GROUP B STREP(S.AGALACTIAE)ISOLATED (A)  Final   Report  Status 02/16/2024 FINAL  Final   Organism ID, Bacteria GROUP B STREP(S.AGALACTIAE)ISOLATED  Final      Susceptibility   Group b strep(s.agalactiae)isolated - MIC*    CLINDAMYCIN >=1 RESISTANT Resistant     AMPICILLIN <=0.25 SENSITIVE Sensitive     ERYTHROMYCIN 2 RESISTANT Resistant     VANCOMYCIN  0.5 SENSITIVE Sensitive     CEFTRIAXONE  <=0.12 SENSITIVE Sensitive     LEVOFLOXACIN 1 SENSITIVE Sensitive     PENICILLIN <=0.06 SENSITIVE Sensitive     * GROUP B STREP(S.AGALACTIAE)ISOLATED  Blood Culture ID Panel (Reflexed)     Status: Abnormal   Collection Time: 02/13/24  9:44 PM  Result Value Ref Range Status   Enterococcus faecalis NOT DETECTED NOT DETECTED Final   Enterococcus Faecium NOT DETECTED NOT DETECTED Final   Listeria monocytogenes NOT DETECTED NOT DETECTED Final   Staphylococcus species NOT DETECTED NOT DETECTED Final   Staphylococcus aureus (BCID) NOT DETECTED NOT DETECTED Final   Staphylococcus epidermidis NOT DETECTED NOT DETECTED Final   Staphylococcus lugdunensis NOT DETECTED NOT DETECTED Final   Streptococcus species DETECTED (A) NOT DETECTED Final    Comment: CRITICAL RESULT CALLED TO, READ BACK BY AND VERIFIED WITH: PHARMD C. AMEND [phone] @ 1909 FH    Streptococcus agalactiae DETECTED (A) NOT DETECTED Final    Comment: CRITICAL RESULT CALLED TO, READ BACK BY AND VERIFIED WITH: PHARMD C. AMEND [phone] @ 1909 FH    Streptococcus pneumoniae NOT DETECTED NOT DETECTED Final   Streptococcus pyogenes NOT DETECTED NOT DETECTED Final   A.calcoaceticus-baumannii NOT DETECTED NOT DETECTED Final   Bacteroides fragilis NOT DETECTED NOT DETECTED Final   Enterobacterales NOT DETECTED NOT DETECTED Final   Enterobacter cloacae complex NOT DETECTED NOT DETECTED Final   Escherichia coli NOT DETECTED NOT DETECTED Final   Klebsiella aerogenes  NOT DETECTED NOT DETECTED Final   Klebsiella oxytoca NOT DETECTED NOT DETECTED Final   Klebsiella pneumoniae NOT DETECTED NOT DETECTED Final    Proteus species NOT DETECTED NOT DETECTED Final   Salmonella species NOT DETECTED NOT DETECTED Final   Serratia marcescens NOT DETECTED NOT DETECTED Final   Haemophilus influenzae NOT DETECTED NOT DETECTED Final   Neisseria meningitidis NOT DETECTED NOT DETECTED Final   Pseudomonas aeruginosa NOT DETECTED NOT DETECTED Final   Stenotrophomonas maltophilia NOT DETECTED NOT DETECTED Final   Candida albicans NOT DETECTED NOT DETECTED Final   Candida auris NOT DETECTED NOT DETECTED Final   Candida glabrata NOT DETECTED NOT DETECTED Final   Candida krusei NOT DETECTED NOT DETECTED Final   Candida parapsilosis NOT DETECTED NOT DETECTED Final   Candida tropicalis NOT DETECTED NOT DETECTED Final   Cryptococcus neoformans/gattii NOT DETECTED NOT DETECTED Final    Comment: Performed at [facility], [address] [place], [place] [address]  Aerobic/Anaerobic Culture w Gram Stain (surgical/deep wound)     Status: None (Preliminary result)   Collection Time: 02/14/24 11:56 AM   Specimen: Pelvis; Abscess  Result Value Ref Range Status   Specimen Description PELVIS  Final   Special Requests LEFT  Final   Gram Stain NO WBC SEEN NO ORGANISMS SEEN   Final   Culture   Final    NO GROWTH 4 DAYS NO ANAEROBES ISOLATED; CULTURE IN PROGRESS FOR 5 DAYS Performed at [facility], [address] [place], [place] [address]    Report Status PENDING  Incomplete  Culture, blood (Routine X 2) w Reflex to ID Panel     Status: None (Preliminary result)   Collection Time: 02/17/24 12:20 PM   Specimen: BLOOD RIGHT ARM  Result Value Ref Range Status   Specimen Description BLOOD RIGHT ARM  Final   Special Requests   Final    BOTTLES DRAWN AEROBIC ONLY Blood Culture adequate volume   Culture   Final    NO GROWTH < 24 HOURS Performed at [facility], [address] [place], [place] [address]    Report Status PENDING  Incomplete  Culture, blood (Routine X 2) w Reflex to ID Panel     Status:  None (Preliminary result)   Collection Time: 02/17/24 12:20 PM   Specimen: BLOOD RIGHT ARM  Result Value Ref Range Status   Specimen Description BLOOD RIGHT ARM  Final   Special Requests   Final    BOTTLES DRAWN AEROBIC ONLY Blood Culture adequate volume   Culture   Final    NO GROWTH < 24 HOURS Performed at [facility], [address] [place], [place] [address]    Report Status PENDING  Incomplete    I have personally spent 28 minutes involved in face-to-face and non-face-to-face activities for this patient on the day of the visit. Professional time spent includes the following activities: preparing to see the patient (review of tests), performing a medically appropriate examination, ordering medications/tests/procedures, communicating with other health care professionals, documenting clinical information in the EMR, communicating results to the patient/family, counseling and educating the patient/family and care coordination.   Greg Jaleal Schliep, NP [facility]  02/18/2024  1:59 PM

## 2024-02-18 NOTE — Assessment & Plan Note (Signed)
 History of class III obesity, complicates care

## 2024-02-18 NOTE — Evaluation (Addendum)
 Physical Therapy Treatment Patient Details Name: [name] MRN: [id number] DOB: Oct 26, 1950 Today's Date: 02/18/2024  History of Present Illness  73 y.o. female presents to [facility] hospital on 02/13/2024 with L hip pain, admitted for possible SI osteomyelitis and abscess. CT guided aspiration of L iliacus on 6/20. PMH includes breast CA, PE, CHF, HTN, anxiety.  Clinical Impression  Pt tolerated mobility progressions, ambulating short distances w/ AD and no physical assistance. Pt demonstrating increased impulsivity this session than in previous sessions, mobilizing w/out direction from therapist, attempting to sit down or stand up from an unlocked chair, and wanting to walk in hallway despite baseline level being transferring to a WC for mobility. PT will continue to treat pt while she is admitted. Recommending HHPT at discharge to address remaining mobility deficits and optimize return to PLOF.          If plan is discharge home, recommend the following: A little help with walking and/or transfers;A little help with bathing/dressing/bathroom;Assistance with cooking/housework;Assist for transportation;Help with stairs or ramp for entrance   Can travel by private vehicle        Equipment Recommendations None recommended by PT  Recommendations for Other Services       Functional Status Assessment       Precautions / Restrictions Precautions Precautions: Fall Recall of Precautions/Restrictions: Impaired Restrictions Weight Bearing Restrictions Per Provider Order: Yes LLE Weight Bearing Per Provider Order: Weight bearing as tolerated      Mobility  Bed Mobility Overal bed mobility: Needs Assistance Bed Mobility: Supine to Sit     Supine to sit: Supervision, HOB elevated, Used rails     General bed mobility comments: pt left sitting EOB    Transfers Overall transfer level: Needs assistance Equipment used: Rolling walker (2 wheels) Transfers: Sit to/from Stand Sit to Stand:  Supervision           General transfer comment: Pt completed STS from EOB and recliner w/ RW and CGA. VC given for sequencing; increased time and effort to compelte.    Ambulation/Gait Ambulation/Gait assistance: Contact guard assist Gait Distance (Feet): 25 Feet (x2) Assistive device: Rolling walker (2 wheels) Gait Pattern/deviations: Step-through pattern, Decreased stride length, Trunk flexed Gait velocity: reduced Gait velocity interpretation: <1.8 ft/sec, indicate of risk for recurrent falls   General Gait Details: pt ambulates w/ increased reliance on RW and demonstrates reciprocal gait strategy w/ reduced cadence. Pt mobilizing 25' x2 trials w/ seated rest break in between trials.  Stairs            Wheelchair Mobility     Tilt Bed    Modified Rankin (Stroke Patients Only)       Balance Overall balance assessment: History of Falls, Needs assistance Sitting-balance support: No upper extremity supported, Feet supported Sitting balance-Leahy Scale: Good Sitting balance - Comments: sitting EOB   Standing balance support: During functional activity, Bilateral upper extremity supported, Reliant on assistive device for balance Standing balance-Leahy Scale: Poor Standing balance comment: reliant on external support                             Pertinent Vitals/Pain Pain Assessment Pain Assessment: No/denies pain Pain Intervention(s): Limited activity within patient's tolerance, Monitored during session, Premedicated before session    Home Living                          Prior Function  Extremity/Trunk Assessment                Communication   Communication Communication: No apparent difficulties    Cognition Arousal: Alert Behavior During Therapy: Impulsive   PT - Cognitive impairments: Safety/Judgement, Problem solving                       PT - Cognition Comments: Pt impulsively  standing and mobilizing despite instructions not to Following commands: Impaired Following commands impaired: Follows multi-step commands inconsistently     Cueing Cueing Techniques: Verbal cues, Visual cues     General Comments General comments (skin integrity, edema, etc.): no signs of acute distress    Exercises     Assessment/Plan    PT Assessment    PT Problem List         PT Treatment Interventions      PT Goals (Current goals can be found in the Care Plan section)  Acute Rehab PT Goals Patient Stated Goal: to go home PT Goal Formulation: With patient Time For Goal Achievement: 02/29/24 Potential to Achieve Goals: Good    Frequency Min 2X/week     Co-evaluation               AM-PAC PT 6 Clicks Mobility  Outcome Measure Help needed turning from your back to your side while in a flat bed without using bedrails?: A Little Help needed moving from lying on your back to sitting on the side of a flat bed without using bedrails?: A Little Help needed moving to and from a bed to a chair (including a wheelchair)?: A Little Help needed standing up from a chair using your arms (e.g., wheelchair or bedside chair)?: A Little Help needed to walk in hospital room?: A Little Help needed climbing 3-5 steps with a railing? : Total 6 Click Score: 16    End of Session Equipment Utilized During Treatment:  (pt impuslively mobilizing w/out gait belt despite therapist requresting pt to sit EOB) Activity Tolerance: Patient tolerated treatment well Patient left: in bed;with call bell/phone within reach;with bed alarm set;with family/visitor present Nurse Communication: Mobility status PT Visit Diagnosis: Muscle weakness (generalized) (M62.81);History of falling (Z91.81)    Time: 1215-1232 PT Time Calculation (min) (ACUTE ONLY): 17 min   Charges:     PT Treatments $Gait Training: 8-22 mins PT General Charges $$ ACUTE PT VISIT: 1 Visit         Leontine Hilt,  SPT Acute Rehab [phone]   Leontine Hilt 02/18/2024, 1:30 PM

## 2024-02-19 DIAGNOSIS — K3533 Acute appendicitis with perforation and localized peritonitis, with abscess: Secondary | ICD-10-CM | POA: Diagnosis not present

## 2024-02-19 LAB — AEROBIC/ANAEROBIC CULTURE W GRAM STAIN (SURGICAL/DEEP WOUND)
Culture: NO GROWTH
Gram Stain: NONE SEEN

## 2024-02-19 LAB — CBC
HCT: 24.4 % — ABNORMAL LOW (ref 36.0–46.0)
Hemoglobin: 8.1 g/dL — ABNORMAL LOW (ref 12.0–15.0)
MCH: 27.3 pg (ref 26.0–34.0)
MCHC: 33.2 g/dL (ref 30.0–36.0)
MCV: 82.2 fL (ref 80.0–100.0)
Platelets: 213 10*3/uL (ref 150–400)
RBC: 2.97 MIL/uL — ABNORMAL LOW (ref 3.87–5.11)
RDW: 16.7 % — ABNORMAL HIGH (ref 11.5–15.5)
WBC: 5 10*3/uL (ref 4.0–10.5)
nRBC: 0 % (ref 0.0–0.2)

## 2024-02-19 LAB — COMPREHENSIVE METABOLIC PANEL WITH GFR
ALT: 6 U/L (ref 0–44)
AST: 14 U/L — ABNORMAL LOW (ref 15–41)
Albumin: 2.1 g/dL — ABNORMAL LOW (ref 3.5–5.0)
Alkaline Phosphatase: 50 U/L (ref 38–126)
Anion gap: 10 (ref 5–15)
BUN: 7 mg/dL — ABNORMAL LOW (ref 8–23)
CO2: 23 mmol/L (ref 22–32)
Calcium: 8.5 mg/dL — ABNORMAL LOW (ref 8.9–10.3)
Chloride: 107 mmol/L (ref 98–111)
Creatinine, Ser: 0.84 mg/dL (ref 0.44–1.00)
GFR, Estimated: >60 mL/min (ref >60)
Glucose, Bld: 87 mg/dL (ref 70–99)
Potassium: 3.3 mmol/L — ABNORMAL LOW (ref 3.5–5.1)
Sodium: 140 mmol/L (ref 135–145)
Total Bilirubin: 0.6 mg/dL (ref 0.0–1.2)
Total Protein: 6 g/dL — ABNORMAL LOW (ref 6.5–8.1)

## 2024-02-19 MED ORDER — POTASSIUM CHLORIDE CRYS ER 20 MEQ PO TBCR
40.0000 meq | EXTENDED_RELEASE_TABLET | ORAL | Status: AC
  Administered 2024-02-19 (×2): 40 meq via ORAL
  Filled 2024-02-19 (×2): qty 2

## 2024-02-19 NOTE — Plan of Care (Signed)

## 2024-02-19 NOTE — Progress Notes (Signed)
 PROGRESS NOTE    VAISHNAVI DALBY  FMW:[id number] DOB: November 29, 1950 DOA: 02/13/2024 PCP: Jefferey Fitch, MD  Chief Complaint  Patient presents with   CA Pt   Abnormal Labs   Hip Pain    Brief Narrative:   73 y.o. F with BrCA metastatic to liver and bone, on Trastuzumab , MO, dCHF, hx PE on Eliquis , and HTN who presented with 6 weeks left hip pain, found to have likely iliacus abscess and left SI joint osteo.   Admitted and underwent aspiration of abscess, culture NG.  ID consulted, started on daptomycin  and Rocephin .  Blood cultures growing GBS in 1/4, urine culture growing E coli.    TTE negative.  Repeat cultures pending.    Assessment & Plan:   Principal Problem:   Abscess of left iliac fossa Active Problems:   Anemia of chronic disease   Essential hypertension   Breast cancer metastasized to multiple sites (HCC)   Obesity, Class III, BMI 40-49.9 (morbid obesity)   History of pulmonary embolism   DNR (do not resuscitate).DNI(Do Not Intubate)   Hypomagnesemia   Septic arthritis of sacroiliac joint (HCC)   Anxiety   Chronic diastolic CHF (congestive heart failure) (HCC)   Osteomyelitis (HCC)   Bacteremia due to group B Streptococcus   Hyperlipidemia  Septic arthritis of sacroiliac joint (HCC)  Left Iliacus Abscess with Myositis Group B strep bacteremia Sacral osteomyelitis MRI 6/18 with moderate to severe marrow edema on either side of the L sacroiliac joint as well as joint effusion and small erosions.  Focal fluid collection measuring up to 2 cm within the L iliacus musculature and associated myositis.  Moderate focal marrow edema to mid coccygeal segment (likely mild fx - will need follow up to exclude second site of infection).  S/p CT guided aspiration of L iliacus -> culture with no growth Blood cultures from 6/19 with GBS (unclear significance) Repeat blood cultures 6/23 NGTD - awaiting 72 hrs NG prior to PICC placement ID recommending continue  dapto/ceftriaxone  - hold off on TEE unless repeat blood cx positive.  No absolute indication for removal of port at this time.  Appreciate further recs.    E. Coli UTI On abx as above  Anemia of chronic disease Hgb stable   Hypomagnesemia Supplemented and resolved   History of pulmonary embolism - Continue Eliquis     Breast cancer metastasized to multiple sites (HCC) On Trastuzumab    Essential hypertension BP soft - Hold Coreg  - Hold spironolactone , losartan , furosemide , amlodipine    Hyperlipidemia - Continue Crestor    Chronic diastolic CHF (congestive heart failure) (HCC) Appears euvolemic - Hold Coreg , spironolactone , furosemide , losartan    Anxiety - Continue duloxetine , Xanax   Obesity, Class III, BMI 40-49.9 (morbid obesity) History of class III obesity, complicates care    DVT prophylaxis: eliquis  Code Status: DNR Family Communication: eliquis  Disposition:   Status is: Inpatient Remains inpatient appropriate because: need for ongoing care   Consultants:  ID Ortho   Procedures:  Echo IMPRESSIONS     1. Left ventricular ejection fraction, by estimation, is 70 to 75%. The  left ventricle has hyperdynamic function. The left ventricle has no  regional wall motion abnormalities. There is mild concentric left  ventricular hypertrophy. Left ventricular  diastolic parameters were normal. The average left ventricular global  longitudinal strain is -20.2 %. The global longitudinal strain is normal.   2. Right ventricular systolic function is normal. The right ventricular  size is normal.   3. The mitral valve is normal in  structure. No evidence of mitral valve  regurgitation. No evidence of mitral stenosis.   4. The aortic valve was not well visualized. Aortic valve regurgitation  is not visualized. No aortic stenosis is present.   Comparison(s): Prior images reviewed side by side. Function is more  vigorous and strain shows more, appropriate deformation  despite incomplete  assessment of the septum.   Conclusion(s)/Recommendation(s): No evidence of valvular vegetations on  this transthoracic echocardiogram. Consider Noheli Melder transesophageal  echocardiogram to exclude infective endocarditis if clinically indicated.    EGD 5/27 - Normal esophagus. - Congestive gastropathy. - Normal duodenal bulb, first portion of the duodenum and second portion of the duodenum. - No specimens collected. - Return patient to hospital ward for ongoing care. - Continue present medications. - [facility] will sign- off; please call back with any questions; thank you for the consultation. Antimicrobials:  Anti-infectives (From admission, onward)    Start     Dose/Rate Route Frequency Ordered Stop   02/18/24 1400  DAPTOmycin  (CUBICIN ) IVPB 700 mg/18mL premix        700 mg 200 mL/hr over 30 Minutes Intravenous Daily 02/17/24 1921     02/14/24 1400  cefTRIAXone  (ROCEPHIN ) 2 g in sodium chloride  0.9 % 100 mL IVPB        2 g 200 mL/hr over 30 Minutes Intravenous Daily 02/14/24 1207     02/14/24 1300  DAPTOmycin  (CUBICIN ) IVPB 700 mg/161mL premix  Status:  Discontinued        700 mg 200 mL/hr over 30 Minutes Intravenous Daily 02/14/24 1207 02/17/24 1408   02/13/24 1445  cefTRIAXone  (ROCEPHIN ) 2 g in sodium chloride  0.9 % 100 mL IVPB  Status:  Discontinued        2 g 200 mL/hr over 30 Minutes Intravenous Every 24 hours 02/13/24 1430 02/13/24 1438       Subjective: No new complaints  Objective: Vitals:   02/18/24 2005 02/19/24 0415 02/19/24 0720 02/19/24 1607  BP: 118/65 (!) 110/56 120/72 (!) 170/68  Pulse: 72 79 76 80  Resp: 17  16 16   Temp: 98.6 F (37 C) 98.2 F (36.8 C) 98.1 F (36.7 C) 98.1 F (36.7 C)  TempSrc: Oral Oral Oral   SpO2: 97% 93% 93% 97%  Weight:      Height:        Intake/Output Summary (Last 24 hours) at 02/19/2024 1824 Last data filed at 02/19/2024 1336 Gross per 24 hour  Intake --  Output 450 ml  Net -450 ml   Filed Weights    02/13/24 1150  Weight: 102.1 kg    Examination:  General exam: Appears calm and comfortable - sitting up in chair with her grandson, husband, son, granddaughter at bedside Respiratory system: unlabored Cardiovascular system: RRR - port without si/sx infection Gastrointestinal system: Abdomen is nondistended, soft and nontender.  Central nervous system: Alert and oriented. No focal neurological deficits. Extremities: no lee     Data Reviewed: I have personally reviewed following labs and imaging studies  CBC: Recent Labs  Lab 02/13/24 1148 02/14/24 0627 02/15/24 0411 02/16/24 1150 02/17/24 0501 02/19/24 0318  WBC 9.6 6.9 6.6 5.4 5.7 5.0  NEUTROABS 5.2  --   --   --   --   --   HGB 9.1* 7.8* 7.5* 7.7* 8.2* 8.1*  HCT 27.3* 22.7* 22.1* 22.8* 24.2* 24.4*  MCV 82.5 79.1* 81.0 81.4 82.0 82.2  PLT 234 205 198 206 223 213    Basic Metabolic Panel: Recent Labs  Lab 02/14/24 0627 02/15/24 0411 02/16/24 1150 02/17/24 0501 02/19/24 0318  NA 141 138 140 140 140  K 4.0 3.7 3.7 3.5 3.3*  CL 107 108 106 108 107  CO2 24 24 25 25 23   GLUCOSE 89 84 107* 89 87  BUN 10 10 5* <5* 7*  CREATININE 1.17* 1.16* 0.87 0.88 0.84  CALCIUM  8.7* 8.2* 8.3* 8.6* 8.5*  MG  --  1.4* 1.6* 2.0  --   PHOS  --  3.6 2.9 3.2  --     GFR: Estimated Creatinine Clearance: 69.1 mL/min (by C-G formula based on SCr of 0.84 mg/dL).  Liver Function Tests: Recent Labs  Lab 02/13/24 1148 02/15/24 0411 02/16/24 1150 02/17/24 0501 02/19/24 0318  AST 12*  --   --   --  14*  ALT 8  --   --   --  6  ALKPHOS 75  --   --   --  50  BILITOT 1.2  --   --   --  0.6  PROT 7.2  --   --   --  6.0*  ALBUMIN 2.5* 1.8* 1.8* 2.0* 2.1*    CBG: No results for input(s): GLUCAP in the last 168 hours.   Recent Results (from the past 240 hours)  Culture, Urine     Status: Abnormal   Collection Time: 02/12/24 11:30 AM   Specimen: Urine, Clean Catch  Result Value Ref Range Status   Specimen Description   Final     URINE, CLEAN CATCH Performed at [facility], [address] [place], [place] [address]    Special Requests   Final    NONE Performed at [facility], [address] [place], [place] [address]    Culture >=100,000 COLONIES/mL ESCHERICHIA COLI (Zurri Rudden)  Final   Report Status 02/14/2024 FINAL  Final   Organism ID, Bacteria ESCHERICHIA COLI (Timberlynn Kizziah)  Final      Susceptibility   Escherichia coli - MIC*    AMPICILLIN >=32 RESISTANT Resistant     CEFAZOLIN >=64 RESISTANT Resistant     CEFEPIME  <=0.12 SENSITIVE Sensitive     CEFTRIAXONE  1 SENSITIVE Sensitive     CIPROFLOXACIN <=0.25 SENSITIVE Sensitive     GENTAMICIN <=1 SENSITIVE Sensitive     IMIPENEM <=0.25 SENSITIVE Sensitive     NITROFURANTOIN <=16 SENSITIVE Sensitive     TRIMETH/SULFA <=20 SENSITIVE Sensitive     AMPICILLIN/SULBACTAM >=32 RESISTANT Resistant     PIP/TAZO 8 SENSITIVE Sensitive ug/mL    * >=100,000 COLONIES/mL ESCHERICHIA COLI  Blood Culture (routine x 2)     Status: None   Collection Time: 02/13/24 12:48 PM   Specimen: BLOOD  Result Value Ref Range Status   Specimen Description   Final    BLOOD PORTA CATH Performed at [facility], [address] [place], [place] [address]    Special Requests   Final    BOTTLES DRAWN AEROBIC AND ANAEROBIC Blood Culture results may not be optimal due to an inadequate volume of blood received in culture bottles Performed at [facility], [address] [place], [place] [address]    Culture   Final    NO GROWTH 5 DAYS Performed at [facility], [address], [place], [place] [address]    Report Status 02/18/2024 FINAL  Final  Urine Culture     Status: Abnormal   Collection Time: 02/13/24  3:17 PM   Specimen: Urine, Random  Result Value Ref Range Status   Specimen Description   Final    URINE, RANDOM Performed at [facility], [address], [place], [place]  [address]    Special Requests   Final    NONE Reflexed from [id number] Performed at [facility], [address] [place], [place] [address]    Culture >=100,000 COLONIES/mL ESCHERICHIA COLI (Christana Angelica)  Final   Report Status 02/15/2024 FINAL  Final   Organism ID, Bacteria ESCHERICHIA COLI (Kandis Henry)  Final      Susceptibility   Escherichia coli - MIC*    AMPICILLIN >=32 RESISTANT Resistant     CEFAZOLIN 8 SENSITIVE Sensitive     CEFEPIME  <=0.12 SENSITIVE Sensitive     CEFTRIAXONE  0.5 SENSITIVE Sensitive     CIPROFLOXACIN <=0.25 SENSITIVE Sensitive     GENTAMICIN <=1 SENSITIVE Sensitive     IMIPENEM <=0.25 SENSITIVE Sensitive     NITROFURANTOIN <=16 SENSITIVE Sensitive     TRIMETH/SULFA <=20 SENSITIVE Sensitive     AMPICILLIN/SULBACTAM >=32 RESISTANT Resistant     PIP/TAZO 8 SENSITIVE Sensitive ug/mL    * >=100,000 COLONIES/mL ESCHERICHIA COLI  Blood Culture (routine x 2)     Status: Abnormal   Collection Time: 02/13/24  9:44 PM   Specimen: BLOOD RIGHT ARM  Result Value Ref Range Status   Specimen Description BLOOD RIGHT ARM  Final   Special Requests   Final    BOTTLES DRAWN AEROBIC AND ANAEROBIC Blood Culture results may not be optimal due to an inadequate volume of blood received in culture bottles   Culture  Setup Time   Final    GRAM POSITIVE COCCI IN CHAINS BOTTLES DRAWN AEROBIC ONLY CRITICAL RESULT CALLED TO, READ BACK BY AND VERIFIED WITH: PHARMD C. AMEND [phone] @ 1909 FH Performed at [facility], [address] [place], [place] [address]    Culture GROUP B STREP(S.AGALACTIAE)ISOLATED (Joan Avetisyan)  Final   Report Status 02/16/2024 FINAL  Final   Organism ID, Bacteria GROUP B STREP(S.AGALACTIAE)ISOLATED  Final      Susceptibility   Group b strep(s.agalactiae)isolated - MIC*    CLINDAMYCIN >=1 RESISTANT Resistant     AMPICILLIN <=0.25 SENSITIVE Sensitive     ERYTHROMYCIN 2 RESISTANT Resistant     VANCOMYCIN  0.5 SENSITIVE Sensitive     CEFTRIAXONE  <=0.12 SENSITIVE  Sensitive     LEVOFLOXACIN 1 SENSITIVE Sensitive     PENICILLIN <=0.06 SENSITIVE Sensitive     * GROUP B STREP(S.AGALACTIAE)ISOLATED  Blood Culture ID Panel (Reflexed)     Status: Abnormal   Collection Time: 02/13/24  9:44 PM  Result Value Ref Range Status   Enterococcus faecalis NOT DETECTED NOT DETECTED Final   Enterococcus Faecium NOT DETECTED NOT DETECTED Final   Listeria monocytogenes NOT DETECTED NOT DETECTED Final   Staphylococcus species NOT DETECTED NOT DETECTED Final   Staphylococcus aureus (BCID) NOT DETECTED NOT DETECTED Final   Staphylococcus epidermidis NOT DETECTED NOT DETECTED Final   Staphylococcus lugdunensis NOT DETECTED NOT DETECTED Final   Streptococcus species DETECTED (Arshawn Valdez) NOT DETECTED Final    Comment: CRITICAL RESULT CALLED TO, READ BACK BY AND VERIFIED WITH: PHARMD C. AMEND [phone] @ 1909 FH    Streptococcus agalactiae DETECTED (Sasha Rogel) NOT DETECTED Final    Comment: CRITICAL RESULT CALLED TO, READ BACK BY AND VERIFIED WITH: PHARMD C. AMEND [phone] @ 1909 FH    Streptococcus pneumoniae NOT DETECTED NOT DETECTED Final   Streptococcus pyogenes NOT DETECTED NOT DETECTED Final   Makaiah Terwilliger.calcoaceticus-baumannii NOT  DETECTED NOT DETECTED Final   Bacteroides fragilis NOT DETECTED NOT DETECTED Final   Enterobacterales NOT DETECTED NOT DETECTED Final   Enterobacter cloacae complex NOT DETECTED NOT DETECTED Final   Escherichia coli NOT DETECTED NOT DETECTED Final   Klebsiella aerogenes NOT DETECTED NOT DETECTED Final   Klebsiella oxytoca NOT DETECTED NOT DETECTED Final   Klebsiella pneumoniae NOT DETECTED NOT DETECTED Final   Proteus species NOT DETECTED NOT DETECTED Final   Salmonella species NOT DETECTED NOT DETECTED Final   Serratia marcescens NOT DETECTED NOT DETECTED Final   Haemophilus influenzae NOT DETECTED NOT DETECTED Final   Neisseria meningitidis NOT DETECTED NOT DETECTED Final   Pseudomonas aeruginosa NOT DETECTED NOT DETECTED Final   Stenotrophomonas maltophilia  NOT DETECTED NOT DETECTED Final   Candida albicans NOT DETECTED NOT DETECTED Final   Candida auris NOT DETECTED NOT DETECTED Final   Candida glabrata NOT DETECTED NOT DETECTED Final   Candida krusei NOT DETECTED NOT DETECTED Final   Candida parapsilosis NOT DETECTED NOT DETECTED Final   Candida tropicalis NOT DETECTED NOT DETECTED Final   Cryptococcus neoformans/gattii NOT DETECTED NOT DETECTED Final    Comment: Performed at [facility], [address], [place], [place] [address]  Aerobic/Anaerobic Culture w Gram Stain (surgical/deep wound)     Status: None   Collection Time: 02/14/24 11:56 AM   Specimen: Pelvis; Abscess  Result Value Ref Range Status   Specimen Description PELVIS  Final   Special Requests LEFT  Final   Gram Stain NO WBC SEEN NO ORGANISMS SEEN   Final   Culture   Final    No growth aerobically or anaerobically. Performed at [facility], [address] [place], [place] [address]    Report Status 02/19/2024 FINAL  Final  Culture, blood (Routine X 2) w Reflex to ID Panel     Status: None (Preliminary result)   Collection Time: 02/17/24 12:20 PM   Specimen: BLOOD RIGHT ARM  Result Value Ref Range Status   Specimen Description BLOOD RIGHT ARM  Final   Special Requests   Final    BOTTLES DRAWN AEROBIC ONLY Blood Culture adequate volume   Culture   Final    NO GROWTH 2 DAYS Performed at [facility], [address] [place], [place] [address]    Report Status PENDING  Incomplete  Culture, blood (Routine X 2) w Reflex to ID Panel     Status: None (Preliminary result)   Collection Time: 02/17/24 12:20 PM   Specimen: BLOOD RIGHT ARM  Result Value Ref Range Status   Specimen Description BLOOD RIGHT ARM  Final   Special Requests   Final    BOTTLES DRAWN AEROBIC ONLY Blood Culture adequate volume   Culture   Final    NO GROWTH 2 DAYS Performed at [facility], [address] [place], [place] [address]    Report Status PENDING  Incomplete          Radiology Studies: ECHOCARDIOGRAM COMPLETE Result Date: 02/18/2024    ECHOCARDIOGRAM REPORT   Patient Name:   LOIS SLAGEL Date of Exam: 02/18/2024 Medical Rec #:  [id number]       Height:       63.0 in Accession #:    [id number]      Weight:       225.0 lb Date of Birth:  [birth year]       BSA:          2.033  m Patient Age:    73 years        BP:           123/47 mmHg Patient Gender: F               HR:           79 bpm. Exam Location:  Inpatient Procedure: 2D Echo, Strain Analysis, Cardiac Doppler and Color Doppler (Both            Spectral and Color Flow Doppler were utilized during procedure). Indications:    bacteremia  History:        Patient has prior history of Echocardiogram examinations, most                 recent 11/12/2023.  Sonographer:    Therisa Crouch Referring Phys: [id number] [employer] IMPRESSIONS  1. Left ventricular ejection fraction, by estimation, is 70 to 75%. The left ventricle has hyperdynamic function. The left ventricle has no regional wall motion abnormalities. There is mild concentric left ventricular hypertrophy. Left ventricular diastolic parameters were normal. The average left ventricular global longitudinal strain is -20.2 %. The global longitudinal strain is normal.  2. Right ventricular systolic function is normal. The right ventricular size is normal.  3. The mitral valve is normal in structure. No evidence of mitral valve regurgitation. No evidence of mitral stenosis.  4. The aortic valve was not well visualized. Aortic valve regurgitation is not visualized. No aortic stenosis is present. Comparison(s): Prior images reviewed side by side. Function is more vigorous and strain shows more, appropriate deformation despite incomplete assessment of the septum. Conclusion(s)/Recommendation(s): No evidence of valvular vegetations on this transthoracic echocardiogram. Consider Lucyle Alumbaugh transesophageal echocardiogram to exclude infective endocarditis if clinically indicated.  FINDINGS  Left Ventricle: Left ventricular ejection fraction, by estimation, is 70 to 75%. The left ventricle has hyperdynamic function. The left ventricle has no regional wall motion abnormalities. The average left ventricular global longitudinal strain is -20.2  %. Strain was performed and the global longitudinal strain is normal. The left ventricular internal cavity size was normal in size. There is mild concentric left ventricular hypertrophy. Left ventricular diastolic parameters were normal. Right Ventricle: The right ventricular size is normal. No increase in right ventricular wall thickness. Right ventricular systolic function is normal. Left Atrium: Left atrial size was normal in size. Right Atrium: Right atrial size was normal in size. Pericardium: There is no evidence of pericardial effusion. Mitral Valve: The mitral valve is normal in structure. No evidence of mitral valve regurgitation. No evidence of mitral valve stenosis. Tricuspid Valve: The tricuspid valve is normal in structure. Tricuspid valve regurgitation is not demonstrated. No evidence of tricuspid stenosis. Aortic Valve: The aortic valve was not well visualized. Aortic valve regurgitation is not visualized. No aortic stenosis is present. Aortic valve mean gradient measures 6.0 mmHg. Aortic valve peak gradient measures 10.9 mmHg. Aortic valve area, by VTI measures 2.22 cm. Pulmonic Valve: The pulmonic valve was normal in structure. Pulmonic valve regurgitation is not visualized. No evidence of pulmonic stenosis. Aorta: The aortic root and ascending aorta are structurally normal, with no evidence of dilitation. IAS/Shunts: The atrial septum is grossly normal.  LEFT VENTRICLE PLAX 2D LVIDd:         4.50 cm   Diastology LVIDs:         3.00 cm   LV e' medial:    11.10 cm/s LV PW:         1.10 cm  LV E/e' medial:  7.5 LV IVS:        1.10 cm   LV e' lateral:   12.80 cm/s LVOT diam:     2.00 cm   LV E/e' lateral: 6.5 LV SV:         74 LV SV Index:    36        2D Longitudinal Strain LVOT Area:     3.14 cm  2D Strain GLS Avg:     -20.2 %  RIGHT VENTRICLE             IVC RV Basal diam:  3.90 cm     IVC diam: 2.10 cm RV S prime:     12.70 cm/s TAPSE (M-mode): 2.5 cm LEFT ATRIUM             Index LA diam:        3.40 cm 1.67 cm/m LA Vol (A2C):   53.6 ml 26.37 ml/m LA Vol (A4C):   55.7 ml 27.40 ml/m LA Biplane Vol: 56.8 ml 27.94 ml/m  AORTIC VALVE AV Area (Vmax):    2.09 cm AV Area (Vmean):   2.28 cm AV Area (VTI):     2.22 cm AV Vmax:           165.00 cm/s AV Vmean:          114.000 cm/s AV VTI:            0.331 m AV Peak Grad:      10.9 mmHg AV Mean Grad:      6.0 mmHg LVOT Vmax:         110.00 cm/s LVOT Vmean:        82.700 cm/s LVOT VTI:          0.234 m LVOT/AV VTI ratio: 0.71  AORTA Ao Root diam: 3.00 cm Ao Asc diam:  3.40 cm MITRAL VALVE MV Area (PHT): 4.06 cm    SHUNTS MV Decel Time: 187 msec    Systemic VTI:  0.23 m MV E velocity: 83.10 cm/s  Systemic Diam: 2.00 cm MV [place] Talcott velocity: 66.00 cm/s MV E/Yousuf Ager ratio:  1.26 Stanly Leavens MD Electronically signed by Stanly Leavens MD Signature Date/Time: 02/18/2024/9:09:52 AM    Final         Scheduled Meds:  apixaban   5 mg Oral BID   Chlorhexidine  Gluconate Cloth  6 each Topical Daily   DULoxetine   60 mg Oral Daily   feeding supplement  237 mL Oral BID BM   gabapentin   300 mg Oral TID   pantoprazole   40 mg Oral Daily   rosuvastatin   10 mg Oral Daily   sodium chloride  flush  10-40 mL Intracatheter Q12H   temazepam   15 mg Oral QHS   Continuous Infusions:  cefTRIAXone  (ROCEPHIN )  IV 2 g (02/19/24 0946)   DAPTOmycin  700 mg (02/19/24 1508)     LOS: 6 days    Time spent: over 30 min     Meliton Monte, MD [facility]   To contact the attending provider between 7A-7P or the covering provider during after hours 7P-7A, please log into the web site [email or web address] and access using universal [place] password for that web site. If you do not have the password, please  call the hospital operator.  02/19/2024, 6:24 PM

## 2024-02-19 NOTE — Plan of Care (Signed)
  Problem: Health Behavior/Discharge Planning: Goal: Ability to manage health-related needs will improve Outcome: Progressing   Problem: Activity: Goal: Risk for activity intolerance will decrease Outcome: Progressing   Problem: Nutrition: Goal: Adequate nutrition will be maintained Outcome: Progressing   Problem: Coping: Goal: Level of anxiety will decrease Outcome: Progressing   Problem: Pain Managment: Goal: General experience of comfort will improve and/or be controlled Outcome: Progressing

## 2024-02-19 NOTE — Progress Notes (Signed)
 Mobility Specialist Progress Note:    02/19/24 1336  Mobility  Activity Transferred from bed to chair  Level of Assistance Contact guard assist, steadying assist  Assistive Device Front wheel walker  Distance Ambulated (ft) 10 ft  LLE Weight Bearing Per Provider Order WBAT  Activity Response Tolerated well  Mobility Referral Yes  Mobility visit 1 Mobility  Mobility Specialist Start Time (ACUTE ONLY) 1138  Mobility Specialist Stop Time (ACUTE ONLY) 1148  Mobility Specialist Time Calculation (min) (ACUTE ONLY) 10 min   Received pt in bed and agreeable to mobility. Required no physical assistance. Pt ambulated around the bed to chair. Personal belongings and call light within reach. All needs met.   Lavanda Pollack Mobility Specialist  Please contact via [employer] or  Rehab Office [phone]

## 2024-02-20 DIAGNOSIS — K3533 Acute appendicitis with perforation and localized peritonitis, with abscess: Secondary | ICD-10-CM | POA: Diagnosis not present

## 2024-02-20 DIAGNOSIS — N390 Urinary tract infection, site not specified: Secondary | ICD-10-CM | POA: Diagnosis not present

## 2024-02-20 DIAGNOSIS — M4628 Osteomyelitis of vertebra, sacral and sacrococcygeal region: Secondary | ICD-10-CM | POA: Diagnosis not present

## 2024-02-20 DIAGNOSIS — M0088 Arthritis due to other bacteria, vertebrae: Secondary | ICD-10-CM | POA: Diagnosis not present

## 2024-02-20 LAB — CULTURE, BLOOD (ROUTINE X 2)

## 2024-02-20 MED ORDER — DAPTOMYCIN IV (FOR PTA / DISCHARGE USE ONLY): 700.0000 mg | INTRAVENOUS | 0 refills | Status: DC

## 2024-02-20 MED ORDER — CARVEDILOL 12.5 MG PO TABS
12.5000 mg | ORAL_TABLET | Freq: Two times a day (BID) | ORAL | Status: DC
Start: 2024-02-20 — End: 2024-03-29

## 2024-02-20 MED ORDER — HEPARIN SOD (PORK) LOCK FLUSH 100 UNIT/ML IV SOLN
500.0000 [IU] | INTRAVENOUS | Status: AC | PRN
  Administered 2024-02-20: 500 [IU]

## 2024-02-20 MED ORDER — CEFTRIAXONE IV (FOR PTA / DISCHARGE USE ONLY): 2.0000 g | INTRAVENOUS | 0 refills | Status: DC

## 2024-02-20 NOTE — TOC Progression Note (Addendum)
 Transition of Care (TOC) - Progression Note   Anticipated discharge today . Pam with [facility] aware and Shylise with [facility] aware   1255 Pam with [facility] on way to face patient face to face prior to discharge ( she is in the building) . [facility] will see 02/21/24 at home in the morning . Patient currently in restroom but family at bedside aware. Patient will need both doses here at hospital prior to discharge. Port will need to be assessed. Nurse aware  Patient Details  Name: [name] MRN: [id number] Date of Birth: [birth year]  Transition of Care [facility]) CM/SW Contact  Kaoru Rezendes, Powell Jansky, RN Phone Number: 02/20/2024, 12:10 PM  Clinical Narrative:       Expected Discharge Plan: Home w Home Health Services Barriers to Discharge: Continued Medical Work up  Expected Discharge Plan and Services   Discharge Planning Services: CM Consult Post Acute Care Choice: Home Health Living arrangements for the past 2 months: Single Family Home                 DME Arranged: N/A DME Agency: NA       HH Arranged: PT HH Agency: [facility] Date HH Agency Contacted: 02/14/24 Time HH Agency Contacted: 1246 Representative spoke with at [facility] Agency: left Channing a message awaiting call back   Social Determinants of Health (SDOH) Interventions SDOH Screenings   Food Insecurity: No Food Insecurity (02/14/2024)  Housing: Low Risk  (02/14/2024)  Transportation Needs: No Transportation Needs (02/14/2024)  Utilities: Not At Risk (02/14/2024)  Social Connections: Moderately Isolated (02/14/2024)  Tobacco Use: Low Risk  (02/14/2024)    Readmission Risk Interventions    02/14/2024   12:50 PM 01/21/2024    1:47 PM  Readmission Risk Prevention Plan  Transportation Screening Complete Complete  PCP or Specialist Appt within 3-5 Days  Complete  HRI or Home Care Consult  Complete  Social Work Consult for Recovery Care Planning/Counseling  Complete  Palliative Care Screening  Not  Applicable  Medication Review [name]) Complete Complete  PCP or Specialist appointment within 3-5 days of discharge Complete   HRI or Home Care Consult Complete

## 2024-02-20 NOTE — Plan of Care (Signed)

## 2024-02-20 NOTE — Discharge Summary (Signed)
 Physician Discharge Summary  RANDE DARIO FMW:[id number] DOB: June 21, 1951 DOA: 02/13/2024  PCP: Jefferey Fitch, MD  Admit date: 02/13/2024 Discharge date: 02/20/2024  Time spent: 40 minutes  Recommendations for Outpatient Follow-up:  Follow outpatient CBC/CMP  6 weeks abx via port  Needs ID follow up outpatient  Port management by [facility] services Follow repeat imaging per ID, notably possible second site of infection at site of moderate focal edema to mid coccygeal segment thought to be Shana Zavaleta mild fracture - would repeat imaging outpatient Follow blood pressure outpatient - held BP meds here, at discharge, resuming amlodipine /coreg  - losartan  and spironolactone  are on hold Resumption of trastuzumab  per Dr. Odean  Discharge Diagnoses:  Principal Problem:   Abscess of left iliac fossa Active Problems:   Anemia of chronic disease   Essential hypertension   Breast cancer metastasized to multiple sites (HCC)   Obesity, Class III, BMI 40-49.9 (morbid obesity)   History of pulmonary embolism   DNR (do not resuscitate).DNI(Do Not Intubate)   Hypomagnesemia   Septic arthritis of sacroiliac joint (HCC)   Anxiety   Chronic diastolic CHF (congestive heart failure) (HCC)   Osteomyelitis (HCC)   Bacteremia due to group B Streptococcus   Hyperlipidemia   Discharge Condition: stable  Diet recommendation: heart healthy  Filed Weights   02/13/24 1150  Weight: 102.1 kg    History of present illness:   73 y.o. F with BrCA metastatic to liver and bone, on Trastuzumab , MO, dCHF, hx PE on Eliquis , and HTN who presented with 6 weeks left hip pain, found to have likely iliacus abscess and left SI joint osteo.   Admitted and underwent aspiration of abscess, culture NG.  ID consulted, started on daptomycin  and Rocephin .  Blood cultures growing GBS in 1/4, urine culture growing E coli.    TTE negative.  Repeat cultures Ngx3.  Stable for discharge on 6/26 with plan for 6 weeks of abx via port.     See below for additional details    Hospital Course:  Assessment and Plan:  Septic arthritis of sacroiliac joint (HCC)  Left Iliacus Abscess with Myositis Group B strep bacteremia Sacral osteomyelitis MRI 6/18 with moderate to severe marrow edema on either side of the L sacroiliac joint as well as joint effusion and small erosions.  Focal fluid collection measuring up to 2 cm within the L iliacus musculature and associated myositis.  Moderate focal marrow edema to mid coccygeal segment (likely mild fx - will need follow up to exclude second site of infection).  S/p CT guided aspiration of L iliacus -> culture with no growth Blood cultures from 6/19 with GBS (unclear significance) Repeat blood cultures 6/23 NGTD x3 ID recommending continue dapto/ceftriaxone  - hold off on TEE unless repeat blood cx positive.  No absolute indication for removal of port at this time.  Appreciate further recs - plan for d/c home today with abx via port for 6 weeks (tentative end date 8/4).     E. Coli UTI On abx as above   Anemia of chronic disease Hgb stable   Hypomagnesemia Supplemented and resolved   History of pulmonary embolism - Continue Eliquis     Breast cancer metastasized to multiple sites [facility]) On Trastuzumab  - follow with oncology regarding when this will be resumed   Essential hypertension Resume amlodipine  at discharge as well as coreg  (lower dose) Hold losartan  and spironolactone  until outpatient follow up given BP ok without any BP meds here   Hyperlipidemia - Continue Crestor   Chronic diastolic CHF (congestive heart failure) (HCC) Appears euvolemic Resume coreg  at discharge Spironolactone  and losartan  on hold Lasix  prn   Anxiety - Continue duloxetine , Xanax    Obesity, Class III, BMI 40-49.9 (morbid obesity) History of class III obesity, complicates care     Procedures:  Echo IMPRESSIONS     1. Left ventricular ejection fraction, by estimation, is 70 to 75%.  The  left ventricle has hyperdynamic function. The left ventricle has no  regional wall motion abnormalities. There is mild concentric left  ventricular hypertrophy. Left ventricular  diastolic parameters were normal. The average left ventricular global  longitudinal strain is -20.2 %. The global longitudinal strain is normal.   2. Right ventricular systolic function is normal. The right ventricular  size is normal.   3. The mitral valve is normal in structure. No evidence of mitral valve  regurgitation. No evidence of mitral stenosis.   4. The aortic valve was not well visualized. Aortic valve regurgitation  is not visualized. No aortic stenosis is present.   Comparison(s): Prior images reviewed side by side. Function is more  vigorous and strain shows more, appropriate deformation despite incomplete  assessment of the septum.   Conclusion(s)/Recommendation(s): No evidence of valvular vegetations on  this transthoracic echocardiogram. Consider Hanne Kegg transesophageal  echocardiogram to exclude infective endocarditis if clinically indicated.     EGD 5/27 - Normal esophagus. - Congestive gastropathy. - Normal duodenal bulb, first portion of the duodenum and second portion of the duodenum. - No specimens collected. - Return patient to hospital ward for ongoing care. - Continue present medications. - [facility] will sign- off; please call back with any questions; thank you for the consultation.   Consultations: ID IR  Discharge Exam: Vitals:   02/20/24 0905 02/20/24 0905  BP: (!) 142/87 (!) 142/87  Pulse: 90 90  Resp: 17 17  Temp: 98.9 F (37.2 C) 98.9 F (37.2 C)  SpO2: 98% 98%   No complaints - notes chronic knee pain, but no other concerns  General: No acute distress. Cardiovascular: Heart sounds show Abuk Selleck regular rate, and rhythm. Lungs: Clear to auscultation bilaterally  Neurological: Alert and oriented 3. Moves all extremities 4. Cranial nerves II through XII grossly  intact. Skin: Warm and dry. No rashes or lesions. Extremities: No clubbing or cyanosis. No edema. No TTP of bilateral knees.  Discharge Instructions   Discharge Instructions     [facility] pharmacist to adjust dose for Vancomycin , Aminoglycosides and other anti-infective therapies as requested by physician.   Complete by: As directed    [facility] to provide Cath Flo 2mg    Complete by: As directed    Administer for PICC line occlusion and as ordered by physician for other access device issues.   Anaphylaxis Kit: Provided to treat any anaphylactic reaction to the medication being provided to the patient if First Dose or when requested by physician   Complete by: As directed    Epinephrine  1mg /ml vial / amp: Administer 0.3mg  (0.32ml) subcutaneously once for moderate to severe anaphylaxis, nurse to call physician and pharmacy when reaction occurs and call 911 if needed for immediate care   Diphenhydramine  50mg /ml IV vial: Administer 25-50mg  IV/IM PRN for first dose reaction, rash, itching, mild reaction, nurse to call physician and pharmacy when reaction occurs   Sodium Chloride  0.9% NS 500ml IV: Administer if needed for hypovolemic blood pressure drop or as ordered by physician after call to physician with anaphylactic reaction   Call MD for:  difficulty breathing, headache or visual disturbances   Complete by: As directed    Call MD for:  extreme fatigue   Complete by: As directed    Call MD for:  hives   Complete by: As directed    Call MD for:  persistant dizziness or light-headedness   Complete by: As directed    Call MD for:  persistant nausea and vomiting   Complete by: As directed    Call MD for:  redness, tenderness, or signs of infection (pain, swelling, redness, odor or green/yellow discharge around incision site)   Complete by: As directed    Call MD for:  severe uncontrolled pain   Complete by: As directed    Call MD for:  temperature >100.4    Complete by: As directed    Change dressing on IV access line weekly and PRN   Complete by: As directed    Diet - low sodium heart healthy   Complete by: As directed    Discharge instructions   Complete by: As directed    You were seen for Fidencia Mccloud sacroiliac joint infection (septic arthritis) as well as osteomyelitis (infection of the bone) and an abscess (infected fluid collection) of the iliacus.  You've improved after drainage of the fluid collection.  We'll plan for you to be on antibiotics for at least 6 weeks.  You'll continue daptomycin  and ceftriaxone .    You'll follow up with the infectious disease doctors outpatient.  They'll call you to arrange an appointment (if you don't hear from them, call for an appointment).    Your port will be managed by home health while you get home antibiotics.    You should get repeat imaging as an outpatient with your PCP or ID.  They'll decide when to potentially repeat imaging to follow up your sacroiliac joint infection and your coccygeal fracture (vs potentially another infectious site).  Your blood pressure is ok today.  We haven't been giving you your blood pressure medicines.  We'll resume your medicines gradually.  Restart your amlodipine  (10 mg daily) and coreg  (12.5 mg twice Baer Hinton day).  Hold your losartan  and spironolactone  for now.  Follow up with your outpatient doctor before resuming these medicines.  Dr. Gudena will decide whether to continue your trastuzumab  infusions outpatient while you're being treated for this infection.  Return for new, recurrent, or worsening symptoms.  Please ask your PCP to request records from this hospitalization so they know what was done and what the next steps will be.   Flush IV access with Sodium Chloride  0.9% and Heparin  10 units/ml or 100 units/ml   Complete by: As directed    Home infusion instructions - [facility]   Complete by: As directed    Instructions: Flush IV access with Sodium Chloride   0.9% and Heparin  10units/ml or 100units/ml   Change dressing on IV access line: Weekly and PRN   Instructions Cath Flo 2mg : Administer for PICC Line occlusion and as ordered by physician for other access device   [facility] pharmacist to adjust dose for: Vancomycin , Aminoglycosides and other anti-infective therapies as requested by physician   Increase activity slowly   Complete by: As directed    Method of administration may be changed at the discretion of home infusion pharmacist based upon assessment of the patient and/or caregiver's ability to self-administer the medication ordered   Complete by: As directed    No wound care   Complete by: As directed  Allergies as of 02/20/2024       Reactions   Adhesive [tape] Other (See Comments)   Tears the skin   Penicillins Hives, Other (See Comments)   PATIENT HAS TOLERATED Cephalosporins        Medication List     PAUSE taking these medications    losartan  100 MG tablet Wait to take this until your doctor or other care provider tells you to start again. Follow up with your PCP for Chalene Treu blood pressure check and repeat labs before restarting your losartan  Commonly known as: COZAAR  Take 100 mg by mouth every morning.   spironolactone  25 MG tablet Wait to take this until your doctor or other care provider tells you to start again. Follow up with your PCP to determine whether to resume this medicine based on repeat labs and repeat blood pressure checks Commonly known as: ALDACTONE  Take 1 tablet daily       STOP taking these medications    MAGNESIUM  PO   oxyCODONE -acetaminophen  5-325 MG tablet Commonly known as: PERCOCET/ROXICET   POTASSIUM PO       TAKE these medications    acetaminophen  500 MG tablet Commonly known as: TYLENOL  Take 500-1,000 mg by mouth every 6 (six) hours as needed for mild pain (pain score 1-3), fever or moderate pain (pain score 4-6).   albuterol  108 (90 Base) MCG/ACT  inhaler Commonly known as: VENTOLIN  HFA Inhale 2 puffs into the lungs every 6 (six) hours as needed for wheezing.   ALPRAZolam  1 MG tablet Commonly known as: XANAX  Take 1 tablet (1 mg total) by mouth 3 (three) times daily as needed for anxiety.   amLODipine  10 MG tablet Commonly known as: NORVASC  Take 1 tablet (10 mg total) by mouth every morning.   apixaban  5 MG Tabs tablet Commonly known as: Eliquis  Take 1 tablet (5 mg total) by mouth 2 (two) times daily.   baclofen  10 MG tablet Commonly known as: LIORESAL  TAKE 1 TABLET BY MOUTH THREE TIMES DAILY AS NEEDED FOR MUSCLE SPASMS   CALCIUM  PO Take 2 tablets by mouth daily.   carvedilol  12.5 MG tablet Commonly known as: COREG  Take 1 tablet (12.5 mg total) by mouth 2 (two) times daily with Jabaree Mercado meal. What changed: how much to take   cefTRIAXone  IVPB Commonly known as: ROCEPHIN  Inject 2 g into the vein daily. Indication:  L-iliac fossa abscess First Dose: Yes Last Day of Therapy:  03/30/24 Labs - Once weekly:  CBC/D and BMP, Labs - Once weekly: ESR and CRP Method of administration: IV Push Method of administration may be changed at the discretion of home infusion pharmacist based upon assessment of the patient and/or caregiver's ability to self-administer the medication ordered.   cyclobenzaprine  5 MG tablet Commonly known as: FLEXERIL  Take 1 tablet (5 mg total) by mouth 2 (two) times daily as needed for muscle spasms.   daptomycin  IVPB Commonly known as: CUBICIN  Inject 700 mg into the vein daily. Indication:  L-iliac fossa abscess First Dose: Yes Last Day of Therapy:  03/30/24 Labs - Once weekly:  CBC/D, BMP, and CPK Labs - Once weekly: ESR and CRP Method of administration: IV Push Method of administration may be changed at the discretion of home infusion pharmacist based upon assessment of the patient and/or caregiver's ability to self-administer the medication ordered.   DULoxetine  60 MG capsule Commonly known as:  CYMBALTA  Take 60 mg by mouth daily.   furosemide  40 MG tablet Commonly known as: LASIX  Take 40 mg by  mouth as needed for fluid or edema.   gabapentin  300 MG capsule Commonly known as: NEURONTIN  TAKE 2 CAPSULES BY MOUTH THREE TIMES DAILY What changed:  when to take this reasons to take this additional instructions   ondansetron  8 MG tablet Commonly known as: ZOFRAN  Take 1 tablet (8 mg total) by mouth every 8 (eight) hours as needed for nausea or vomiting.   Oxycodone  HCl 10 MG Tabs Take 10 mg by mouth every 6 (six) hours as needed (Pain).   pantoprazole  40 MG tablet Commonly known as: Protonix  Take 1 tablet (40 mg total) by mouth daily.   predniSONE 5 MG tablet Commonly known as: DELTASONE Take 5 mg by mouth daily as needed (inflammation).   rosuvastatin  10 MG tablet Commonly known as: Crestor  Take 1 tablet (10 mg total) by mouth daily.   temazepam  30 MG capsule Commonly known as: RESTORIL  Take 1 capsule (30 mg total) by mouth at bedtime.               Discharge Care Instructions  (From admission, onward)           Start     Ordered   02/20/24 0000  Change dressing on IV access line weekly and PRN  (Home infusion instructions - [facility] )        02/20/24 1240           Allergies  Allergen Reactions   Adhesive [Tape] Other (See Comments)    Tears the skin    Penicillins Hives and Other (See Comments)    PATIENT HAS TOLERATED Cephalosporins    Follow-up Information     [facility]  Follow up.   Contact information: [address] [place] [address] [phone]         [facility] Follow up.   Why: phone number [phone]        Jefferey Fitch, MD Follow up.   Specialty: Internal Medicine Contact information: [address] [place] [address] [phone]         [facility]              Follow up.   Why: Call for an appointment Contact  information: [address] [place]  [phone]        Odean Potts, MD Follow up.   Specialty: Hematology and Oncology Why: call for an appointment Contact information: [address] [place] [address] [phone]                  The results of significant diagnostics from this hospitalization (including imaging, microbiology, ancillary and laboratory) are listed below for reference.    Significant Diagnostic Studies: ECHOCARDIOGRAM COMPLETE Result Date: 02/18/2024    ECHOCARDIOGRAM REPORT   Patient Name:   JATZIRI GOFFREDO Date of Exam: 02/18/2024 Medical Rec #:  [id number]       Height:       63.0 in Accession #:    [id number]      Weight:       225.0 lb Date of Birth:  04-14-1951       BSA:          2.033 m Patient Age:    73 years        BP:           123/47 mmHg Patient Gender: F  HR:           79 bpm. Exam Location:  Inpatient Procedure: 2D Echo, Strain Analysis, Cardiac Doppler and Color Doppler (Both            Spectral and Color Flow Doppler were utilized during procedure). Indications:    bacteremia  History:        Patient has prior history of Echocardiogram examinations, most                 recent 11/12/2023.  Sonographer:    Therisa Crouch Referring Phys: [id number] [facility] IMPRESSIONS  1. Left ventricular ejection fraction, by estimation, is 70 to 75%. The left ventricle has hyperdynamic function. The left ventricle has no regional wall motion abnormalities. There is mild concentric left ventricular hypertrophy. Left ventricular diastolic parameters were normal. The average left ventricular global longitudinal strain is -20.2 %. The global longitudinal strain is normal.  2. Right ventricular systolic function is normal. The right ventricular size is normal.  3. The mitral valve is normal in structure. No evidence of mitral valve regurgitation. No evidence of mitral stenosis.  4. The aortic valve was not well  visualized. Aortic valve regurgitation is not visualized. No aortic stenosis is present. Comparison(s): Prior images reviewed side by side. Function is more vigorous and strain shows more, appropriate deformation despite incomplete assessment of the septum. Conclusion(s)/Recommendation(s): No evidence of valvular vegetations on this transthoracic echocardiogram. Consider Ivyrose Hashman transesophageal echocardiogram to exclude infective endocarditis if clinically indicated. FINDINGS  Left Ventricle: Left ventricular ejection fraction, by estimation, is 70 to 75%. The left ventricle has hyperdynamic function. The left ventricle has no regional wall motion abnormalities. The average left ventricular global longitudinal strain is -20.2  %. Strain was performed and the global longitudinal strain is normal. The left ventricular internal cavity size was normal in size. There is mild concentric left ventricular hypertrophy. Left ventricular diastolic parameters were normal. Right Ventricle: The right ventricular size is normal. No increase in right ventricular wall thickness. Right ventricular systolic function is normal. Left Atrium: Left atrial size was normal in size. Right Atrium: Right atrial size was normal in size. Pericardium: There is no evidence of pericardial effusion. Mitral Valve: The mitral valve is normal in structure. No evidence of mitral valve regurgitation. No evidence of mitral valve stenosis. Tricuspid Valve: The tricuspid valve is normal in structure. Tricuspid valve regurgitation is not demonstrated. No evidence of tricuspid stenosis. Aortic Valve: The aortic valve was not well visualized. Aortic valve regurgitation is not visualized. No aortic stenosis is present. Aortic valve mean gradient measures 6.0 mmHg. Aortic valve peak gradient measures 10.9 mmHg. Aortic valve area, by VTI measures 2.22 cm. Pulmonic Valve: The pulmonic valve was normal in structure. Pulmonic valve regurgitation is not visualized. No  evidence of pulmonic stenosis. Aorta: The aortic root and ascending aorta are structurally normal, with no evidence of dilitation. IAS/Shunts: The atrial septum is grossly normal.  LEFT VENTRICLE PLAX 2D LVIDd:         4.50 cm   Diastology LVIDs:         3.00 cm   LV e' medial:    11.10 cm/s LV PW:         1.10 cm   LV E/e' medial:  7.5 LV IVS:        1.10 cm   LV e' lateral:   12.80 cm/s LVOT diam:     2.00 cm   LV E/e' lateral: 6.5 LV SV:  74 LV SV Index:   36        2D Longitudinal Strain LVOT Area:     3.14 cm  2D Strain GLS Avg:     -20.2 %  RIGHT VENTRICLE             IVC RV Basal diam:  3.90 cm     IVC diam: 2.10 cm RV S prime:     12.70 cm/s TAPSE (M-mode): 2.5 cm LEFT ATRIUM             Index LA diam:        3.40 cm 1.67 cm/m LA Vol (A2C):   53.6 ml 26.37 ml/m LA Vol (A4C):   55.7 ml 27.40 ml/m LA Biplane Vol: 56.8 ml 27.94 ml/m  AORTIC VALVE AV Area (Vmax):    2.09 cm AV Area (Vmean):   2.28 cm AV Area (VTI):     2.22 cm AV Vmax:           165.00 cm/s AV Vmean:          114.000 cm/s AV VTI:            0.331 m AV Peak Grad:      10.9 mmHg AV Mean Grad:      6.0 mmHg LVOT Vmax:         110.00 cm/s LVOT Vmean:        82.700 cm/s LVOT VTI:          0.234 m LVOT/AV VTI ratio: 0.71  AORTA Ao Root diam: 3.00 cm Ao Asc diam:  3.40 cm MITRAL VALVE MV Area (PHT): 4.06 cm    SHUNTS MV Decel Time: 187 msec    Systemic VTI:  0.23 m MV E velocity: 83.10 cm/s  Systemic Diam: 2.00 cm MV Garvin Ellena velocity: 66.00 cm/s MV E/Aiysha Jillson ratio:  1.26 Stanly Leavens MD Electronically signed by Stanly Leavens MD Signature Date/Time: 02/18/2024/9:09:52 AM    Final    MR Lumbar Spine W Wo Contrast Result Date: 02/15/2024 EXAM: MRI LUMBAR SPINE 02/15/2024 06:49:41 AM TECHNIQUE: Multiplanar multisequence MRI of the lumbar spine was performed with and without the administration of 10mL intravenous gadobutrol  (GADAVIST ) 1 MMOL/ML. COMPARISON: MRI of the lumbar spine 01/23/2024. CLINICAL HISTORY: Lower back pain. FINDINGS:  BONES AND ALIGNMENT: Rightward curvature of the lumbar spine is centered at L3. Type 1 and type 2 Modic changes are present at L2-3. Type 2 Modic changes are present at L5-S1. SPINAL CORD: Conus medullaris terminates at L1-L2. SOFT TISSUES: Mayana Irigoyen simple cyst in the right kidney measuring 3.3 cm is stable. Recommend no imaging to follow up. L1-L2: Mild desiccation and disc bulging are present without focal stenosis. L2-L3: Chronic loss of disc height. Janelli Welling broad-based disc protrusion extends into the foramina bilaterally. Mild bilateral foraminal stenosis is present. L3-L4: Mild disc desiccation is present. No focal stenosis is present. L4-L5: Davida Falconi broad-based disc protrusion is present. Moderate right subarticular and bilateral moderate foraminal stenosis are present. L5-S1: Babe Clenney rightward disc protrusion is present. Mild right subarticular narrowing is present. Moderate right and mild left foraminal stenosis are present. IMPRESSION: 1. Broad-based disc protrusion at L2-L3 with mild bilateral foraminal stenosis. 2. Broad-based disc protrusion at L4-L5 with moderate right subarticular and bilateral moderate foraminal stenosis. 3. Rightward disc protrusion at L5-S1 with mild right subarticular narrowing and moderate right and mild left foraminal stenosis. Electronically signed by: Lonni Necessary MD 02/15/2024 08:59 AM EDT RP Workstation: [id number]   CT GUIDED PERITONEAL/RETROPERITONEAL FLUID DRAIN BY PERC CATH  Result Date: 02/14/2024 CLINICAL DATA:  Hip pain, sacroiliitis, poorly marginated fluid in the left iliacus musculature with associated myositis EXAM: CT GUIDED NEEDLE ASPIRATE BIOPSY OF LEFT ILIACUS ANESTHESIA/SEDATION: lidocaine  1% subcutaneous PROCEDURE: The procedure risks, benefits, and alternatives were explained to the patient. Questions regarding the procedure were encouraged and answered. The patient understands and consents to the procedure. Limited CT through the pelvis was obtained in the prominent left  iliacus process was localized. An appropriate skin site was determined and marked. The operative field was prepped with chlorhexidinein Koni Kannan sterile fashion, and Sheenah Dimitroff sterile drape was applied covering the operative field. Delfino Friesen sterile gown and sterile gloves were used for the procedure. Local anesthesia was provided with 1% Lidocaine . Under CT fluoroscopic guidance, 18 gauge trocar needle advanced into the left iliac is process. Multiple passes through different regions do not returning fluid. Ultimately, Gatlin Kittell 10 cm multi sidehole Yueh sheath needle was advanced into the iliac access and Loic Hobin scant amount of bloody fluid was aspirated, sent for Gram stain and culture. Postprocedure scans show no hemorrhage or other apparent complication. RADIATION DOSE REDUCTION: This exam was performed according to the departmental dose-optimization program which includes automated exposure control, adjustment of the mA and/or kV according to patient size and/or use of iterative reconstruction technique. COMPLICATIONS: None immediate FINDINGS: Fullness of the left iliacus was noted, corresponding to recent MR findings. No significant fluid could be aspirated. Salar Molden scant amount of bloody material was aspirated, sent for Gram stain and culture. IMPRESSION: 1. Technically successful CT-guided aspiration of left iliacus process, sent for Gram stain and culture. Electronically Signed   By: JONETTA Faes M.D.   On: 02/14/2024 15:08   DG Chest Port 1 View Result Date: 02/13/2024 CLINICAL DATA:  Sepsis EXAM: PORTABLE CHEST 1 VIEW COMPARISON:  Chest radiograph dated 01/19/2024 FINDINGS: Right chest wall port tip projects over the superior cavoatrial junction. Normal lung volumes. No focal consolidations. No pleural effusion or pneumothorax. The heart size and mediastinal contours are within normal limits. No acute osseous abnormality. Bilateral axillary surgical clips. IMPRESSION: No consolidative pneumonia. Electronically Signed   By: Limin  Xu M.D.   On:  02/13/2024 14:38   MR HIP LEFT W WO CONTRAST Result Date: 02/12/2024 EXAM DESCRIPTION: MR HIP LEFT W WO CONTRAST CLINICAL HISTORY: infected hip versus met cancer causing necrosis and erosion, please evaluate further COMPARISON: None Available. TECHNIQUE: MRI of the hip is performed according to our usual protocol with multiplanar multi sequence imaging. FINDINGS: There is moderate to severe marrow edema on either side of the left sacroiliac joint as well as small erosions and associated joint effusion. There is Juliane Guest moderate myositis to the left iliac as musculature. Fluid collection measuring close to 2 cm in greatest dimension consistent with intramuscular abscess. No significant degenerative change. There is moderate focal marrow edema to the mid coccyx. The marrow signal is otherwise unremarkable. Mild greater trochanteric bursal fluid on the left. No joint effusion. The tendons and musculature are otherwise unremarkable. IMPRESSION: Moderate to severe marrow edema on either side of the left sacroiliac joint as well as joint effusion and small erosions. Septic arthritis/osteomyelitis should BE excluded. Active inflammatory arthritis would also be in the differential. There is Jeb Schloemer focal fluid collection measuring up to 2 cm within the left iliacus musculature and associated myositis. This is suspicious for Trannie Bardales small abscess. Given this finding, the adjacent sacroiliac joint findings are likely infectious in etiology. Moderate focal marrow edema to Azucena Dart mid coccygeal segment which is probably Gaurav Baldree  mild fracture. Bj Morlock second site of infection should be excluded and this will need follow-up. Electronically signed by: Reyes Frees MD 02/12/2024 09:18 PM EDT RP Workstation: [id number]   MR Brain W Wo Contrast Result Date: 02/12/2024 CLINICAL DATA:  Metastatic disease evaluation EXAM: MRI HEAD WITHOUT AND WITH CONTRAST TECHNIQUE: Multiplanar, multiecho pulse sequences of the brain and surrounding structures were obtained  without and with intravenous contrast. CONTRAST:  10mL GADAVIST  GADOBUTROL  1 MMOL/ML IV SOLN COMPARISON:  CT head 03/24/2021. FINDINGS: Brain: No acute infarction, hemorrhage, hydrocephalus, extra-axial collection or mass lesion. No pathologic enhancement. Vascular: Major arterial flow voids are maintained at the skull base. Skull and upper cervical spine: Normal marrow signal. Sinuses/Orbits: Clear sinuses.  No acute orbital findings. Other: No mastoid effusions. IMPRESSION: No evidence of acute intracranial abnormality or metastatic disease. Electronically Signed   By: Gilmore GORMAN Molt M.D.   On: 02/12/2024 19:50    Microbiology: Recent Results (from the past 240 hours)  Culture, Urine     Status: Abnormal   Collection Time: 02/12/24 11:30 AM   Specimen: Urine, Clean Catch  Result Value Ref Range Status   Specimen Description   Final    URINE, CLEAN CATCH Performed at [facility], [address], [place], [place] [address]    Special Requests   Final    NONE Performed at [facility], [address], [place], [place] [address]    Culture >=100,000 COLONIES/mL ESCHERICHIA COLI (Tristyn Pharris)  Final   Report Status 02/14/2024 FINAL  Final   Organism ID, Bacteria ESCHERICHIA COLI (Mounir Skipper)  Final      Susceptibility   Escherichia coli - MIC*    AMPICILLIN >=32 RESISTANT Resistant     CEFAZOLIN >=64 RESISTANT Resistant     CEFEPIME  <=0.12 SENSITIVE Sensitive     CEFTRIAXONE  1 SENSITIVE Sensitive     CIPROFLOXACIN <=0.25 SENSITIVE Sensitive     GENTAMICIN <=1 SENSITIVE Sensitive     IMIPENEM <=0.25 SENSITIVE Sensitive     NITROFURANTOIN <=16 SENSITIVE Sensitive     TRIMETH/SULFA <=20 SENSITIVE Sensitive     AMPICILLIN/SULBACTAM >=32 RESISTANT Resistant     PIP/TAZO 8 SENSITIVE Sensitive ug/mL    * >=100,000 COLONIES/mL ESCHERICHIA COLI  Blood Culture (routine x 2)     Status: None   Collection Time: 02/13/24 12:48 PM   Specimen: BLOOD  Result Value  Ref Range Status   Specimen Description   Final    BLOOD PORTA CATH Performed at [facility], [address], [place], [place] [address]    Special Requests   Final    BOTTLES DRAWN AEROBIC AND ANAEROBIC Blood Culture results may not be optimal due to an inadequate volume of blood received in culture bottles Performed at [facility], [address] [place], [place] [address]    Culture   Final    NO GROWTH 5 DAYS Performed at [facility], [address], [place], [place] [address]    Report Status 02/18/2024 FINAL  Final  Urine Culture     Status: Abnormal   Collection Time: 02/13/24  3:17 PM   Specimen: Urine, Random  Result Value Ref Range Status   Specimen Description   Final    URINE, RANDOM Performed at [facility], [address] [place], [place] [address]    Special Requests   Final    NONE Reflexed from [id number] Performed at [facility], [address]  [place], [place] [address]    Culture >=100,000 COLONIES/mL ESCHERICHIA COLI (Doyne Ellinger)  Final   Report Status 02/15/2024 FINAL  Final   Organism ID, Bacteria ESCHERICHIA COLI (Kirkland Figg)  Final      Susceptibility   Escherichia coli - MIC*    AMPICILLIN >=32 RESISTANT Resistant     CEFAZOLIN 8 SENSITIVE Sensitive     CEFEPIME  <=0.12 SENSITIVE Sensitive     CEFTRIAXONE  0.5 SENSITIVE Sensitive     CIPROFLOXACIN <=0.25 SENSITIVE Sensitive     GENTAMICIN <=1 SENSITIVE Sensitive     IMIPENEM <=0.25 SENSITIVE Sensitive     NITROFURANTOIN <=16 SENSITIVE Sensitive     TRIMETH/SULFA <=20 SENSITIVE Sensitive     AMPICILLIN/SULBACTAM >=32 RESISTANT Resistant     PIP/TAZO 8 SENSITIVE Sensitive ug/mL    * >=100,000 COLONIES/mL ESCHERICHIA COLI  Blood Culture (routine x 2)     Status: Abnormal   Collection Time: 02/13/24  9:44 PM   Specimen: BLOOD RIGHT ARM  Result Value Ref Range Status   Specimen Description BLOOD RIGHT ARM  Final   Special Requests    Final    BOTTLES DRAWN AEROBIC AND ANAEROBIC Blood Culture results may not be optimal due to an inadequate volume of blood received in culture bottles   Culture  Setup Time   Final    GRAM POSITIVE COCCI IN CHAINS BOTTLES DRAWN AEROBIC ONLY CRITICAL RESULT CALLED TO, READ BACK BY AND VERIFIED WITH: PHARMD C. AMEND [phone] @ 1909 FH Performed at [facility], [address], [place], [place] [address]    Culture GROUP B STREP(S.AGALACTIAE)ISOLATED (Kiano Terrien)  Final   Report Status 02/16/2024 FINAL  Final   Organism ID, Bacteria GROUP B STREP(S.AGALACTIAE)ISOLATED  Final      Susceptibility   Group b strep(s.agalactiae)isolated - MIC*    CLINDAMYCIN >=1 RESISTANT Resistant     AMPICILLIN <=0.25 SENSITIVE Sensitive     ERYTHROMYCIN 2 RESISTANT Resistant     VANCOMYCIN  0.5 SENSITIVE Sensitive     CEFTRIAXONE  <=0.12 SENSITIVE Sensitive     LEVOFLOXACIN 1 SENSITIVE Sensitive     PENICILLIN <=0.06 SENSITIVE Sensitive     * GROUP B STREP(S.AGALACTIAE)ISOLATED  Blood Culture ID Panel (Reflexed)     Status: Abnormal   Collection Time: 02/13/24  9:44 PM  Result Value Ref Range Status   Enterococcus faecalis NOT DETECTED NOT DETECTED Final   Enterococcus Faecium NOT DETECTED NOT DETECTED Final   Listeria monocytogenes NOT DETECTED NOT DETECTED Final   Staphylococcus species NOT DETECTED NOT DETECTED Final   Staphylococcus aureus (BCID) NOT DETECTED NOT DETECTED Final   Staphylococcus epidermidis NOT DETECTED NOT DETECTED Final   Staphylococcus lugdunensis NOT DETECTED NOT DETECTED Final   Streptococcus species DETECTED (Leonie Amacher) NOT DETECTED Final    Comment: CRITICAL RESULT CALLED TO, READ BACK BY AND VERIFIED WITH: PHARMD C. AMEND [phone] @ 1909 FH    Streptococcus agalactiae DETECTED (Shakaria Raphael) NOT DETECTED Final    Comment: CRITICAL RESULT CALLED TO, READ BACK BY AND VERIFIED WITH: PHARMD C. AMEND [phone] @ 1909 FH    Streptococcus pneumoniae NOT DETECTED NOT DETECTED Final   Streptococcus pyogenes  NOT DETECTED NOT DETECTED Final   Alila Sotero.calcoaceticus-baumannii NOT DETECTED NOT DETECTED Final   Bacteroides fragilis NOT DETECTED NOT DETECTED Final   Enterobacterales NOT DETECTED NOT DETECTED Final   Enterobacter cloacae complex NOT DETECTED NOT DETECTED Final   Escherichia coli NOT DETECTED NOT DETECTED Final   Klebsiella aerogenes NOT DETECTED NOT DETECTED Final   Klebsiella oxytoca NOT DETECTED  NOT DETECTED Final   Klebsiella pneumoniae NOT DETECTED NOT DETECTED Final   Proteus species NOT DETECTED NOT DETECTED Final   Salmonella species NOT DETECTED NOT DETECTED Final   Serratia marcescens NOT DETECTED NOT DETECTED Final   Haemophilus influenzae NOT DETECTED NOT DETECTED Final   Neisseria meningitidis NOT DETECTED NOT DETECTED Final   Pseudomonas aeruginosa NOT DETECTED NOT DETECTED Final   Stenotrophomonas maltophilia NOT DETECTED NOT DETECTED Final   Candida albicans NOT DETECTED NOT DETECTED Final   Candida auris NOT DETECTED NOT DETECTED Final   Candida glabrata NOT DETECTED NOT DETECTED Final   Candida krusei NOT DETECTED NOT DETECTED Final   Candida parapsilosis NOT DETECTED NOT DETECTED Final   Candida tropicalis NOT DETECTED NOT DETECTED Final   Cryptococcus neoformans/gattii NOT DETECTED NOT DETECTED Final    Comment: Performed at [facility], [address], [place], [place] [address]  Aerobic/Anaerobic Culture w Gram Stain (surgical/deep wound)     Status: None   Collection Time: 02/14/24 11:56 AM   Specimen: Pelvis; Abscess  Result Value Ref Range Status   Specimen Description PELVIS  Final   Special Requests LEFT  Final   Gram Stain NO WBC SEEN NO ORGANISMS SEEN   Final   Culture   Final    No growth aerobically or anaerobically. Performed at [facility], [address] [place], [place] [address]    Report Status 02/19/2024 FINAL  Final  Culture, blood (Routine X 2) w Reflex to ID Panel     Status: None (Preliminary result)   Collection Time:  02/17/24 12:20 PM   Specimen: BLOOD RIGHT ARM  Result Value Ref Range Status   Specimen Description BLOOD RIGHT ARM  Final   Special Requests   Final    BOTTLES DRAWN AEROBIC ONLY Blood Culture adequate volume   Culture   Final    NO GROWTH 3 DAYS Performed at [facility], [address] [place], [place] [address]    Report Status PENDING  Incomplete  Culture, blood (Routine X 2) w Reflex to ID Panel     Status: None (Preliminary result)   Collection Time: 02/17/24 12:20 PM   Specimen: BLOOD RIGHT ARM  Result Value Ref Range Status   Specimen Description BLOOD RIGHT ARM  Final   Special Requests   Final    BOTTLES DRAWN AEROBIC ONLY Blood Culture adequate volume   Culture   Final    NO GROWTH 3 DAYS Performed at [facility], [address], [place], [place] [address]    Report Status PENDING  Incomplete     Labs: Basic Metabolic Panel: Recent Labs  Lab 02/14/24 0627 02/15/24 0411 02/16/24 1150 02/17/24 0501 02/19/24 0318  NA 141 138 140 140 140  K 4.0 3.7 3.7 3.5 3.3*  CL 107 108 106 108 107  CO2 24 24 25 25 23   GLUCOSE 89 84 107* 89 87  BUN 10 10 5* <5* 7*  CREATININE 1.17* 1.16* 0.87 0.88 0.84  CALCIUM  8.7* 8.2* 8.3* 8.6* 8.5*  MG  --  1.4* 1.6* 2.0  --   PHOS  --  3.6 2.9 3.2  --    Liver Function Tests: Recent Labs  Lab 02/15/24 0411 02/16/24 1150 02/17/24 0501 02/19/24 0318  AST  --   --   --  14*  ALT  --   --   --  6  ALKPHOS  --   --   --  50  BILITOT  --   --   --  0.6  PROT  --   --   --  6.0*  ALBUMIN 1.8* 1.8* 2.0* 2.1*   No results for input(s): LIPASE, AMYLASE in the last 168 hours. No results for input(s): AMMONIA in the last 168 hours. CBC: Recent Labs  Lab 02/14/24 0627 02/15/24 0411 02/16/24 1150 02/17/24 0501 02/19/24 0318  WBC 6.9 6.6 5.4 5.7 5.0  HGB 7.8* 7.5* 7.7* 8.2* 8.1*  HCT 22.7* 22.1* 22.8* 24.2* 24.4*  MCV 79.1* 81.0 81.4 82.0 82.2  PLT 205 198 206 223 213   Cardiac Enzymes: Recent Labs  Lab  02/15/24 0411  CKTOTAL 22*   BNP: BNP (last 3 results) Recent Labs    11/12/23 1428  BNP 64.6    ProBNP (last 3 results) No results for input(s): PROBNP in the last 8760 hours.  CBG: No results for input(s): GLUCAP in the last 168 hours.     Signed:  Meliton Monte MD.  [facility] 02/20/2024, 1:51 PM

## 2024-02-20 NOTE — Progress Notes (Signed)
 [facility]  Date of Admission:  02/13/2024     Reason for Follow Up: Abscess of left iliac fossa  Total days of antibiotics 7         ASSESSMENT:  [name] blood cultures remain without growth today in 72 hours in the setting of left pelvic abscess and sacroiliac osteomyelitis complicated by group B streptococcus bacteremia.  Discussed plan of care to continue current dose of daptomycin  and ceftriaxone  from clearance of blood cultures on 02/17/2024 placing tentative end date of 03/30/2024.  Therapeutic drug monitoring of CK levels while on daptomycin .  Port in place.  Will place home health/OPAT orders.  Will continue to monitor blood cultures but okay for discharge from ID standpoint once home health arranged.  Remaining medical and supportive care per internal medicine.  PLAN:  Continue current dose of daptomycin  and ceftriaxone  for a total of 6 weeks from clearance of blood cultures in 02/17/2024 placing tentative end date of 03/30/2024. Therapeutic drug monitoring of CK levels while on daptomycin . Port access and care per protocol. Home health/OPAT orders. Monitor blood cultures for clearance of bacteremia. Okay for discharge from ID standpoint once home health arranged. Remaining medical and supportive care per internal medicine.   Diagnosis:  Left pelvic abscess and sacroiliac osteomyelitis complicated by group B streptococcus bacteremia  Allergies  Allergen Reactions   Adhesive [Tape] Other (See Comments)    Tears the skin    Penicillins Hives and Other (See Comments)    PATIENT HAS TOLERATED Cephalosporins    OPAT Orders Discharge antibiotics to be given via PICC line Discharge antibiotics: Daptomycin  700 mg IV every 24 and ceftriaxone  2 g IV every 24. Per pharmacy protocol   Duration: 6 weeks End Date: 03/30/2024  [facility] Care Per Protocol:  Home health RN for IV administration and teaching; PICC line care and labs.    Labs weekly  while on IV antibiotics: _x_ CBC with differential _x_ BMP __ CMP _x_ CRP _x_ ESR __ Vancomycin  trough _x_ CK  __ Please pull PIC at completion of IV antibiotics __ Please leave PIC in place until doctor has seen patient or been notified **Port in place.  Do not remove.   Fax weekly labs to [phone]  Clinic Follow Up Appt:  03/09/2024 with Dr. Dea   Principal Problem:   Abscess of left iliac fossa Active Problems:   Essential hypertension   Anxiety   Chronic diastolic CHF (congestive heart failure) (HCC)   Breast cancer metastasized to multiple sites (HCC)   Anemia of chronic disease   Obesity, Class III, BMI 40-49.9 (morbid obesity)   History of pulmonary embolism   DNR (do not resuscitate).DNI(Do Not Intubate)   Hypomagnesemia   Septic arthritis of sacroiliac joint (HCC)   Osteomyelitis (HCC)   Bacteremia due to group B Streptococcus   Hyperlipidemia    apixaban   5 mg Oral BID   Chlorhexidine  Gluconate Cloth  6 each Topical Daily   DULoxetine   60 mg Oral Daily   feeding supplement  237 mL Oral BID BM   gabapentin   300 mg Oral TID   pantoprazole   40 mg Oral Daily   rosuvastatin   10 mg Oral Daily   sodium chloride  flush  10-40 mL Intracatheter Q12H   temazepam   15 mg Oral QHS    SUBJECTIVE:  Afebrile overnight with no acute events.  Tolerating antibiotics with no adverse side effects.  Allergies  Allergen Reactions   Adhesive [Tape] Other (See  Comments)    Tears the skin    Penicillins Hives and Other (See Comments)    PATIENT HAS TOLERATED Cephalosporins     Review of Systems: Review of Systems  Constitutional:  Negative for chills, fever and weight loss.  Respiratory:  Negative for cough, shortness of breath and wheezing.   Cardiovascular:  Negative for chest pain and leg swelling.  Gastrointestinal:  Negative for abdominal pain, constipation, diarrhea, nausea and vomiting.  Skin:  Negative for rash.      OBJECTIVE: Vitals:    02/19/24 0720 02/19/24 1607 02/19/24 2037 02/20/24 0254  BP: 120/72 (!) 170/68 127/67 136/67  Pulse: 76 80 87 87  Resp: 16 16 17 18   Temp: 98.1 F (36.7 C) 98.1 F (36.7 C) 98.7 F (37.1 C) 98.4 F (36.9 C)  TempSrc: Oral  Oral Oral  SpO2: 93% 97% 97% 98%  Weight:      Height:       Body mass index is 39.86 kg/m.  Physical Exam Constitutional:      General: She is not in acute distress.    Appearance: She is well-developed.     Comments: Lying in bed with head of bed elevated; pleasant   Cardiovascular:     Rate and Rhythm: Normal rate and regular rhythm.     Heart sounds: Normal heart sounds.  Pulmonary:     Effort: Pulmonary effort is normal.     Breath sounds: Normal breath sounds.   Skin:    General: Skin is warm and dry.   Neurological:     Mental Status: She is alert.   Psychiatric:        Mood and Affect: Mood normal.     Lab Results Lab Results  Component Value Date   WBC 5.0 02/19/2024   HGB 8.1 (L) 02/19/2024   HCT 24.4 (L) 02/19/2024   MCV 82.2 02/19/2024   PLT 213 02/19/2024    Lab Results  Component Value Date   CREATININE 0.84 02/19/2024   BUN 7 (L) 02/19/2024   NA 140 02/19/2024   K 3.3 (L) 02/19/2024   CL 107 02/19/2024   CO2 23 02/19/2024    Lab Results  Component Value Date   ALT 6 02/19/2024   AST 14 (L) 02/19/2024   ALKPHOS 50 02/19/2024   BILITOT 0.6 02/19/2024     Microbiology: Recent Results (from the past 240 hours)  Culture, Urine     Status: Abnormal   Collection Time: 02/12/24 11:30 AM   Specimen: Urine, Clean Catch  Result Value Ref Range Status   Specimen Description   Final    URINE, CLEAN CATCH Performed at [facility], [address] [place], [place] [address]    Special Requests   Final    NONE Performed at [facility], [address], [place], [place] [address]    Culture >=100,000 COLONIES/mL ESCHERICHIA COLI (A)  Final   Report Status 02/14/2024  FINAL  Final   Organism ID, Bacteria ESCHERICHIA COLI (A)  Final      Susceptibility   Escherichia coli - MIC*    AMPICILLIN >=32 RESISTANT Resistant     CEFAZOLIN >=64 RESISTANT Resistant     CEFEPIME  <=0.12 SENSITIVE Sensitive     CEFTRIAXONE  1 SENSITIVE Sensitive     CIPROFLOXACIN <=0.25 SENSITIVE Sensitive     GENTAMICIN <=1 SENSITIVE Sensitive     IMIPENEM <=0.25 SENSITIVE Sensitive     NITROFURANTOIN <=16 SENSITIVE Sensitive  TRIMETH/SULFA <=20 SENSITIVE Sensitive     AMPICILLIN/SULBACTAM >=32 RESISTANT Resistant     PIP/TAZO 8 SENSITIVE Sensitive ug/mL    * >=100,000 COLONIES/mL ESCHERICHIA COLI  Blood Culture (routine x 2)     Status: None   Collection Time: 02/13/24 12:48 PM   Specimen: BLOOD  Result Value Ref Range Status   Specimen Description   Final    BLOOD PORTA CATH Performed at [facility], [address] [place], [place] [address]    Special Requests   Final    BOTTLES DRAWN AEROBIC AND ANAEROBIC Blood Culture results may not be optimal due to an inadequate volume of blood received in culture bottles Performed at [facility], [address] [place], [place] [address]    Culture   Final    NO GROWTH 5 DAYS Performed at [facility], [address], [place], [place] [address]    Report Status 02/18/2024 FINAL  Final  Urine Culture     Status: Abnormal   Collection Time: 02/13/24  3:17 PM   Specimen: Urine, Random  Result Value Ref Range Status   Specimen Description   Final    URINE, RANDOM Performed at [facility], [address], [place], [place] [address]    Special Requests   Final    NONE Reflexed from [id number] Performed at [facility], [address] [place], [place] [address]    Culture >=100,000 COLONIES/mL ESCHERICHIA COLI (A)  Final   Report Status 02/15/2024 FINAL  Final   Organism ID, Bacteria ESCHERICHIA COLI (A)  Final      Susceptibility    Escherichia coli - MIC*    AMPICILLIN >=32 RESISTANT Resistant     CEFAZOLIN 8 SENSITIVE Sensitive     CEFEPIME  <=0.12 SENSITIVE Sensitive     CEFTRIAXONE  0.5 SENSITIVE Sensitive     CIPROFLOXACIN <=0.25 SENSITIVE Sensitive     GENTAMICIN <=1 SENSITIVE Sensitive     IMIPENEM <=0.25 SENSITIVE Sensitive     NITROFURANTOIN <=16 SENSITIVE Sensitive     TRIMETH/SULFA <=20 SENSITIVE Sensitive     AMPICILLIN/SULBACTAM >=32 RESISTANT Resistant     PIP/TAZO 8 SENSITIVE Sensitive ug/mL    * >=100,000 COLONIES/mL ESCHERICHIA COLI  Blood Culture (routine x 2)     Status: Abnormal   Collection Time: 02/13/24  9:44 PM   Specimen: BLOOD RIGHT ARM  Result Value Ref Range Status   Specimen Description BLOOD RIGHT ARM  Final   Special Requests   Final    BOTTLES DRAWN AEROBIC AND ANAEROBIC Blood Culture results may not be optimal due to an inadequate volume of blood received in culture bottles   Culture  Setup Time   Final    GRAM POSITIVE COCCI IN CHAINS BOTTLES DRAWN AEROBIC ONLY CRITICAL RESULT CALLED TO, READ BACK BY AND VERIFIED WITH: PHARMD C. AMEND [phone] @ 1909 FH Performed at [facility], [address], [place], [place] [address]    Culture GROUP B STREP(S.AGALACTIAE)ISOLATED (A)  Final   Report Status 02/16/2024 FINAL  Final   Organism ID, Bacteria GROUP B STREP(S.AGALACTIAE)ISOLATED  Final      Susceptibility   Group b strep(s.agalactiae)isolated - MIC*    CLINDAMYCIN >=1 RESISTANT Resistant     AMPICILLIN <=0.25 SENSITIVE Sensitive     ERYTHROMYCIN 2 RESISTANT Resistant     VANCOMYCIN  0.5 SENSITIVE Sensitive     CEFTRIAXONE  <=0.12 SENSITIVE Sensitive     LEVOFLOXACIN 1 SENSITIVE Sensitive  PENICILLIN <=0.06 SENSITIVE Sensitive     * GROUP B STREP(S.AGALACTIAE)ISOLATED  Blood Culture ID Panel (Reflexed)     Status: Abnormal   Collection Time: 02/13/24  9:44 PM  Result Value Ref Range Status   Enterococcus faecalis NOT DETECTED NOT DETECTED Final   Enterococcus Faecium  NOT DETECTED NOT DETECTED Final   Listeria monocytogenes NOT DETECTED NOT DETECTED Final   Staphylococcus species NOT DETECTED NOT DETECTED Final   Staphylococcus aureus (BCID) NOT DETECTED NOT DETECTED Final   Staphylococcus epidermidis NOT DETECTED NOT DETECTED Final   Staphylococcus lugdunensis NOT DETECTED NOT DETECTED Final   Streptococcus species DETECTED (A) NOT DETECTED Final    Comment: CRITICAL RESULT CALLED TO, READ BACK BY AND VERIFIED WITH: PHARMD C. AMEND [phone] @ 1909 FH    Streptococcus agalactiae DETECTED (A) NOT DETECTED Final    Comment: CRITICAL RESULT CALLED TO, READ BACK BY AND VERIFIED WITH: PHARMD C. AMEND [phone] @ 1909 FH    Streptococcus pneumoniae NOT DETECTED NOT DETECTED Final   Streptococcus pyogenes NOT DETECTED NOT DETECTED Final   A.calcoaceticus-baumannii NOT DETECTED NOT DETECTED Final   Bacteroides fragilis NOT DETECTED NOT DETECTED Final   Enterobacterales NOT DETECTED NOT DETECTED Final   Enterobacter cloacae complex NOT DETECTED NOT DETECTED Final   Escherichia coli NOT DETECTED NOT DETECTED Final   Klebsiella aerogenes NOT DETECTED NOT DETECTED Final   Klebsiella oxytoca NOT DETECTED NOT DETECTED Final   Klebsiella pneumoniae NOT DETECTED NOT DETECTED Final   Proteus species NOT DETECTED NOT DETECTED Final   Salmonella species NOT DETECTED NOT DETECTED Final   Serratia marcescens NOT DETECTED NOT DETECTED Final   Haemophilus influenzae NOT DETECTED NOT DETECTED Final   Neisseria meningitidis NOT DETECTED NOT DETECTED Final   Pseudomonas aeruginosa NOT DETECTED NOT DETECTED Final   Stenotrophomonas maltophilia NOT DETECTED NOT DETECTED Final   Candida albicans NOT DETECTED NOT DETECTED Final   Candida auris NOT DETECTED NOT DETECTED Final   Candida glabrata NOT DETECTED NOT DETECTED Final   Candida krusei NOT DETECTED NOT DETECTED Final   Candida parapsilosis NOT DETECTED NOT DETECTED Final   Candida tropicalis NOT DETECTED NOT DETECTED Final    Cryptococcus neoformans/gattii NOT DETECTED NOT DETECTED Final    Comment: Performed at [facility], [address] [place], [place] [address]  Aerobic/Anaerobic Culture w Gram Stain (surgical/deep wound)     Status: None   Collection Time: 02/14/24 11:56 AM   Specimen: Pelvis; Abscess  Result Value Ref Range Status   Specimen Description PELVIS  Final   Special Requests LEFT  Final   Gram Stain NO WBC SEEN NO ORGANISMS SEEN   Final   Culture   Final    No growth aerobically or anaerobically. Performed at [facility], [address] [place], [place] [address]    Report Status 02/19/2024 FINAL  Final  Culture, blood (Routine X 2) w Reflex to ID Panel     Status: None (Preliminary result)   Collection Time: 02/17/24 12:20 PM   Specimen: BLOOD RIGHT ARM  Result Value Ref Range Status   Specimen Description BLOOD RIGHT ARM  Final   Special Requests   Final    BOTTLES DRAWN AEROBIC ONLY Blood Culture adequate volume   Culture   Final    NO GROWTH 3 DAYS Performed at [facility], [address] [place], [place] [address]    Report Status PENDING  Incomplete  Culture, blood (Routine X 2) w Reflex  to ID Panel     Status: None (Preliminary result)   Collection Time: 02/17/24 12:20 PM   Specimen: BLOOD RIGHT ARM  Result Value Ref Range Status   Specimen Description BLOOD RIGHT ARM  Final   Special Requests   Final    BOTTLES DRAWN AEROBIC ONLY Blood Culture adequate volume   Culture   Final    NO GROWTH 3 DAYS Performed at [facility], [address], [place], [place] [address]    Report Status PENDING  Incomplete    I have personally spent 28 minutes involved in face-to-face and non-face-to-face activities for this patient on the day of the visit. Professional time spent includes the following activities: preparing to see the patient (review of tests), performing a medically appropriate examination, ordering medications, communicating with other  health care professionals, documenting clinical information in the EMR, communicating results and counseling patient regarding medication and plan of care, and care coordination.   Greg Dione Mccombie, NP [facility]  02/20/2024  10:59 AM

## 2024-02-21 ENCOUNTER — Telehealth: Payer: Self-pay

## 2024-02-21 DIAGNOSIS — M4628 Osteomyelitis of vertebra, sacral and sacrococcygeal region: Secondary | ICD-10-CM | POA: Diagnosis not present

## 2024-02-21 DIAGNOSIS — Z6839 Body mass index (BMI) 39.0-39.9, adult: Secondary | ICD-10-CM | POA: Diagnosis not present

## 2024-02-21 DIAGNOSIS — I5032 Chronic diastolic (congestive) heart failure: Secondary | ICD-10-CM | POA: Diagnosis not present

## 2024-02-21 DIAGNOSIS — C50919 Malignant neoplasm of unspecified site of unspecified female breast: Secondary | ICD-10-CM | POA: Diagnosis not present

## 2024-02-21 DIAGNOSIS — Z556 Problems related to health literacy: Secondary | ICD-10-CM | POA: Diagnosis not present

## 2024-02-21 DIAGNOSIS — Z86711 Personal history of pulmonary embolism: Secondary | ICD-10-CM | POA: Diagnosis not present

## 2024-02-21 DIAGNOSIS — Z792 Long term (current) use of antibiotics: Secondary | ICD-10-CM | POA: Diagnosis not present

## 2024-02-21 DIAGNOSIS — E66813 Obesity, class 3: Secondary | ICD-10-CM | POA: Diagnosis not present

## 2024-02-21 DIAGNOSIS — I110 Hypertensive heart disease with heart failure: Secondary | ICD-10-CM | POA: Diagnosis not present

## 2024-02-21 DIAGNOSIS — F419 Anxiety disorder, unspecified: Secondary | ICD-10-CM | POA: Diagnosis not present

## 2024-02-21 DIAGNOSIS — K3533 Acute appendicitis with perforation and localized peritonitis, with abscess: Secondary | ICD-10-CM | POA: Diagnosis not present

## 2024-02-21 DIAGNOSIS — E785 Hyperlipidemia, unspecified: Secondary | ICD-10-CM | POA: Diagnosis not present

## 2024-02-21 DIAGNOSIS — N390 Urinary tract infection, site not specified: Secondary | ICD-10-CM | POA: Diagnosis not present

## 2024-02-21 DIAGNOSIS — D630 Anemia in neoplastic disease: Secondary | ICD-10-CM | POA: Diagnosis not present

## 2024-02-21 DIAGNOSIS — M4658 Other infective spondylopathies, sacral and sacrococcygeal region: Secondary | ICD-10-CM | POA: Diagnosis not present

## 2024-02-21 DIAGNOSIS — C787 Secondary malignant neoplasm of liver and intrahepatic bile duct: Secondary | ICD-10-CM | POA: Diagnosis not present

## 2024-02-21 DIAGNOSIS — K219 Gastro-esophageal reflux disease without esophagitis: Secondary | ICD-10-CM | POA: Diagnosis not present

## 2024-02-21 DIAGNOSIS — B9620 Unspecified Escherichia coli [E. coli] as the cause of diseases classified elsewhere: Secondary | ICD-10-CM | POA: Diagnosis not present

## 2024-02-21 DIAGNOSIS — C7800 Secondary malignant neoplasm of unspecified lung: Secondary | ICD-10-CM | POA: Diagnosis not present

## 2024-02-21 DIAGNOSIS — C7951 Secondary malignant neoplasm of bone: Secondary | ICD-10-CM | POA: Diagnosis not present

## 2024-02-21 DIAGNOSIS — G629 Polyneuropathy, unspecified: Secondary | ICD-10-CM | POA: Diagnosis not present

## 2024-02-21 DIAGNOSIS — Z7901 Long term (current) use of anticoagulants: Secondary | ICD-10-CM | POA: Diagnosis not present

## 2024-02-21 DIAGNOSIS — B951 Streptococcus, group B, as the cause of diseases classified elsewhere: Secondary | ICD-10-CM | POA: Diagnosis not present

## 2024-02-21 DIAGNOSIS — Z853 Personal history of malignant neoplasm of breast: Secondary | ICD-10-CM | POA: Diagnosis not present

## 2024-02-21 DIAGNOSIS — Z452 Encounter for adjustment and management of vascular access device: Secondary | ICD-10-CM | POA: Diagnosis not present

## 2024-02-21 NOTE — Transitions of Care (Post Inpatient/ED Visit) (Signed)
   02/21/2024  Name: [name] MRN: [id number] DOB: 28-Nov-1950  Today's TOC FU Call Status: Today's TOC FU Call Status:: Unsuccessful Call (1st Attempt) Unsuccessful Call (1st Attempt) Date: 02/21/24  Attempted to reach the patient regarding the most recent Inpatient/ED visit. There was no answer and unable to leave a voice mail message or leave a call back number on this outreach attempt.   Follow Up Plan: Additional outreach attempts will be made to reach the patient to complete the Transitions of Care (Post Inpatient/ED visit) call.    Bing Edison MSN, RN RN Case Sales executive Health  [facility] Office Hours M-F [phone] Direct Dial: [phone] Main Phone [phone]  Fax: [phone] [email or web address]

## 2024-02-21 NOTE — Telephone Encounter (Signed)
 Pt called to find out if she will be receiving tx 7/1 after she sees MD because she is currently getting IV abx at home via her PAC. Advised pt she could discuss this with MD at her appt 7/1.

## 2024-02-22 LAB — CULTURE, BLOOD (ROUTINE X 2)
Culture: NO GROWTH
Culture: NO GROWTH
Special Requests: ADEQUATE
Special Requests: ADEQUATE

## 2024-02-24 ENCOUNTER — Telehealth: Payer: Self-pay

## 2024-02-24 NOTE — Transitions of Care (Post Inpatient/ED Visit) (Signed)
 02/24/2024  Name: [name] MRN: [id number] DOB: 08-30-1950  Today's TOC FU Call Status: Today's TOC FU Call Status:: Successful TOC FU Call Completed TOC FU Call Complete Date: 02/24/24 Patient's Name and Date of Birth confirmed.  Situation: (per Discharge notes) 73 y.o. F with BrCA metastatic to liver and bone, on Trastuzumab , MO, dCHF, hx PE on Eliquis , and HTN who presented with 6 weeks left hip pain, found to have likely iliacus abscess and left SI joint osteo. Discharge home with IV antibiotics via [facility] and home health via [facility].    Transition Care Management Follow-up Telephone Call Date of Discharge: 02/20/24 Discharge Facility: [facility]) Type of Discharge: Inpatient Admission Primary Inpatient Discharge Diagnosis:: Abscess of left iliac fossa How have you been since you were released from the hospital?:  (Doing just okay.) Any questions or concerns?: No  Items Reviewed: Did you receive and understand the discharge instructions provided?: Yes Medications obtained,verified, and reconciled?: No Medications Not Reviewed Reasons:: Other: (Patient declined medication review but stated she had her AVS and understood what was new and what was not. Had oncology appt. 02/25/24 will review there patient said.) Any new allergies since your discharge?: No Do you have support at home?: Yes People in Home [RPT]: child(ren), adult Name of Support/Comfort Primary Source: [name] (Daughter)  [phone] [facility]) and Yolanda, Davis (Spouse)  [phone] (Home Phone)  Medications Reviewed Today: Patient declined medication review stating she had her list and knew what was new and what had changed and had follow up with oncology tomorrow 02/25/24.  Medications Reviewed Today   Medications were not reviewed in this encounter     Home Care and Equipment/Supplies: Were Home Health Services Ordered?: Yes Name of Home Health Agency:: [facility] Has Agency set up a time to  come to your home?: Yes First Home Health Visit Date: 02/22/24 Any new equipment or medical supplies ordered?: Yes Name of Medical supply agency?: [facility] for home antibiotic infusion. Were you able to get the equipment/medical supplies?: Yes Do you have any questions related to the use of the equipment/supplies?: No  Functional Questionnaire: Do you need assistance with bathing/showering or dressing?: Yes Do you need assistance with meal preparation?: Yes Do you need assistance with eating?: No Do you have difficulty maintaining continence: No Do you need assistance with getting out of bed/getting out of a chair/moving?: Yes  Follow up appointments reviewed: PCP Follow-up appointment confirmed?: No MD Provider Line Number:[phone] Given: No Specialist Hospital Follow-up appointment confirmed?: Yes Date of Specialist follow-up appointment?: 02/25/24 Follow-Up Specialty Provider:: Oncologist Do you need transportation to your follow-up appointment?: No Do you understand care options if your condition(s) worsen?: Yes-patient verbalized understanding 02/24/24: TOC RN CM completed call with partial completion of follow up items post discharge.  Patient sounded (subjective) very tired and said she was doing just okay.  Waiting on Home health RN to arrive for IV antibiotics  Declined medications review but stated she understood discharge instructions and has a oncology HFU t7/1/25. Patient is/was undergoing current cancer treatment prior to admission.  Has a needed DME at home, spouse and daughter as support.  Declined follow up calls//30 day program stated she was good and did not need it.  Discussed PCP HFU with patient and patient stated she understood and would talk to oncologist about it.  Declined appointment assistance at this time.   Bing Edison MSN, RN RN Case Sales executive Health  [facility] Office Hours M-F [phone] Direct Dial: [phone] Main Phone  [phone]  Fax: [phone] [email or web address]

## 2024-02-24 NOTE — Assessment & Plan Note (Deleted)
 2004: Liver. Lung and bone mets Neoadj chemo TCH X 6 completed April 2005 foll by herceptin  maintenance   bilateral mastectomies with bilateral axillary lymph node dissection 12/07/2004, showing             (a) on the right, a mypT1c ypN1 invasive ductal carcinoma, grade 3, estrogen and progesterone receptor negative, HER-2 positive, with an MIB-1 of 31%             (b) on the left, ypT2 ypN1 invasive ductal carcinoma, grade 2, estrogen and progesterone receptor negative, HER-2 positive, with an MIB-1 of 35%.   Adj XRT ----------------------------------------------------------------------------------------------------------------------------------------  Current treatment: Herceptin  maintenance therapy Plan to continue Herceptin  indefinitely every 28 days.   SVC syndrome: On lifelong anticoagulation.  (Because of cost issues we may be switching her from Eliquis  to Pradaxa.  I gave her additional information about Pradaxa and the cost)   Bone scan 09/27/2022: No bone metastases ECHO: 10/31/22: EF 55% (Dr. Rolan is doing her echocardiograms now every 9 months) CT CAP 05/05/2023: No evidence of recurrent or metastatic disease in chest abdomen pelvis CT CAP 11/04/2023: No evidence of recurrent or metastatic disease.  Healing or healed sternal/anterolateral right fifth and sixth rib fractures.   Cost of treatment: She was able to get her Eliquis  for free but she may run out of it in a month.  I provided her with a voucher for dabigatran 60 capsules which she will fill at [facility] for $60 once she runs out of Eliquis . Continue monthly Herceptin  infusions.

## 2024-02-25 ENCOUNTER — Inpatient Hospital Stay: Admitting: Hematology and Oncology

## 2024-02-25 ENCOUNTER — Inpatient Hospital Stay

## 2024-02-25 ENCOUNTER — Telehealth: Payer: Self-pay | Admitting: Hematology and Oncology

## 2024-02-25 ENCOUNTER — Inpatient Hospital Stay: Admitting: Dietician

## 2024-02-25 DIAGNOSIS — C50919 Malignant neoplasm of unspecified site of unspecified female breast: Secondary | ICD-10-CM

## 2024-02-25 DIAGNOSIS — I110 Hypertensive heart disease with heart failure: Secondary | ICD-10-CM | POA: Diagnosis not present

## 2024-02-25 DIAGNOSIS — B951 Streptococcus, group B, as the cause of diseases classified elsewhere: Secondary | ICD-10-CM | POA: Diagnosis not present

## 2024-02-25 DIAGNOSIS — I5032 Chronic diastolic (congestive) heart failure: Secondary | ICD-10-CM | POA: Diagnosis not present

## 2024-02-25 DIAGNOSIS — K3533 Acute appendicitis with perforation and localized peritonitis, with abscess: Secondary | ICD-10-CM | POA: Diagnosis not present

## 2024-02-25 DIAGNOSIS — M4658 Other infective spondylopathies, sacral and sacrococcygeal region: Secondary | ICD-10-CM | POA: Diagnosis not present

## 2024-02-25 DIAGNOSIS — M4628 Osteomyelitis of vertebra, sacral and sacrococcygeal region: Secondary | ICD-10-CM | POA: Diagnosis not present

## 2024-02-25 NOTE — Telephone Encounter (Signed)
 poke with pt to confirm her appointment time with her. We lost connection and I called back, left vm this time and confirmed again her appointments times

## 2024-02-25 NOTE — Telephone Encounter (Signed)
 Called pt multiple times to reschedule missed appt, left vm for her to call back

## 2024-02-26 ENCOUNTER — Telehealth: Payer: Self-pay

## 2024-02-26 ENCOUNTER — Other Ambulatory Visit: Payer: Self-pay

## 2024-02-26 DIAGNOSIS — C50012 Malignant neoplasm of nipple and areola, left female breast: Secondary | ICD-10-CM

## 2024-02-26 NOTE — Telephone Encounter (Signed)
 Pt called and states she is being seen by home health and is in need of IV bax. She is asking if they are allowed to use her PAC for labs and abx infusions. Advised this is OK as long as they are using sterile technique and the nurse is trained on how to do this. Upon her call, noticed pt is not scheduled for labs 7/29. PF W/LAB scheduled for 1115 7/29. Pt given instructions to come to [facility] at 1100 that day. She is agreeable.

## 2024-02-27 ENCOUNTER — Other Ambulatory Visit: Payer: Self-pay

## 2024-02-27 DIAGNOSIS — M4658 Other infective spondylopathies, sacral and sacrococcygeal region: Secondary | ICD-10-CM | POA: Diagnosis not present

## 2024-02-27 DIAGNOSIS — I5032 Chronic diastolic (congestive) heart failure: Secondary | ICD-10-CM | POA: Diagnosis not present

## 2024-02-27 DIAGNOSIS — I110 Hypertensive heart disease with heart failure: Secondary | ICD-10-CM | POA: Diagnosis not present

## 2024-02-27 DIAGNOSIS — K3533 Acute appendicitis with perforation and localized peritonitis, with abscess: Secondary | ICD-10-CM | POA: Diagnosis not present

## 2024-02-27 DIAGNOSIS — M4628 Osteomyelitis of vertebra, sacral and sacrococcygeal region: Secondary | ICD-10-CM | POA: Diagnosis not present

## 2024-02-27 DIAGNOSIS — B951 Streptococcus, group B, as the cause of diseases classified elsewhere: Secondary | ICD-10-CM | POA: Diagnosis not present

## 2024-03-01 ENCOUNTER — Other Ambulatory Visit: Payer: Self-pay

## 2024-03-02 DIAGNOSIS — M4658 Other infective spondylopathies, sacral and sacrococcygeal region: Secondary | ICD-10-CM | POA: Diagnosis not present

## 2024-03-02 DIAGNOSIS — I110 Hypertensive heart disease with heart failure: Secondary | ICD-10-CM | POA: Diagnosis not present

## 2024-03-02 DIAGNOSIS — M4628 Osteomyelitis of vertebra, sacral and sacrococcygeal region: Secondary | ICD-10-CM | POA: Diagnosis not present

## 2024-03-02 DIAGNOSIS — B951 Streptococcus, group B, as the cause of diseases classified elsewhere: Secondary | ICD-10-CM | POA: Diagnosis not present

## 2024-03-02 DIAGNOSIS — K3533 Acute appendicitis with perforation and localized peritonitis, with abscess: Secondary | ICD-10-CM | POA: Diagnosis not present

## 2024-03-02 DIAGNOSIS — I5032 Chronic diastolic (congestive) heart failure: Secondary | ICD-10-CM | POA: Diagnosis not present

## 2024-03-03 DIAGNOSIS — B951 Streptococcus, group B, as the cause of diseases classified elsewhere: Secondary | ICD-10-CM | POA: Diagnosis not present

## 2024-03-03 DIAGNOSIS — K3533 Acute appendicitis with perforation and localized peritonitis, with abscess: Secondary | ICD-10-CM | POA: Diagnosis not present

## 2024-03-03 DIAGNOSIS — M4658 Other infective spondylopathies, sacral and sacrococcygeal region: Secondary | ICD-10-CM | POA: Diagnosis not present

## 2024-03-03 DIAGNOSIS — I5032 Chronic diastolic (congestive) heart failure: Secondary | ICD-10-CM | POA: Diagnosis not present

## 2024-03-03 DIAGNOSIS — I110 Hypertensive heart disease with heart failure: Secondary | ICD-10-CM | POA: Diagnosis not present

## 2024-03-03 DIAGNOSIS — M4628 Osteomyelitis of vertebra, sacral and sacrococcygeal region: Secondary | ICD-10-CM | POA: Diagnosis not present

## 2024-03-09 ENCOUNTER — Telehealth: Payer: Self-pay

## 2024-03-09 ENCOUNTER — Inpatient Hospital Stay: Payer: Self-pay | Admitting: Infectious Diseases

## 2024-03-09 NOTE — Telephone Encounter (Signed)
 Called patient to rescheuled missed appointment. No answer left vm to contact office.

## 2024-03-10 DIAGNOSIS — M4658 Other infective spondylopathies, sacral and sacrococcygeal region: Secondary | ICD-10-CM | POA: Diagnosis not present

## 2024-03-10 DIAGNOSIS — M4628 Osteomyelitis of vertebra, sacral and sacrococcygeal region: Secondary | ICD-10-CM | POA: Diagnosis not present

## 2024-03-10 DIAGNOSIS — K3533 Acute appendicitis with perforation and localized peritonitis, with abscess: Secondary | ICD-10-CM | POA: Diagnosis not present

## 2024-03-10 DIAGNOSIS — I110 Hypertensive heart disease with heart failure: Secondary | ICD-10-CM | POA: Diagnosis not present

## 2024-03-10 DIAGNOSIS — I5032 Chronic diastolic (congestive) heart failure: Secondary | ICD-10-CM | POA: Diagnosis not present

## 2024-03-10 DIAGNOSIS — B951 Streptococcus, group B, as the cause of diseases classified elsewhere: Secondary | ICD-10-CM | POA: Diagnosis not present

## 2024-03-12 ENCOUNTER — Emergency Department (HOSPITAL_COMMUNITY)

## 2024-03-12 ENCOUNTER — Inpatient Hospital Stay (HOSPITAL_COMMUNITY)
Admission: EM | Admit: 2024-03-12 | Discharge: 2024-03-17 | DRG: 391 | Disposition: A | Discharge status: Home Health | Attending: Internal Medicine | Admitting: Internal Medicine

## 2024-03-12 ENCOUNTER — Other Ambulatory Visit: Payer: Self-pay

## 2024-03-12 ENCOUNTER — Encounter (HOSPITAL_COMMUNITY): Payer: Self-pay

## 2024-03-12 DIAGNOSIS — D638 Anemia in other chronic diseases classified elsewhere: Secondary | ICD-10-CM | POA: Diagnosis not present

## 2024-03-12 DIAGNOSIS — R5383 Other fatigue: Secondary | ICD-10-CM | POA: Diagnosis not present

## 2024-03-12 DIAGNOSIS — C50919 Malignant neoplasm of unspecified site of unspecified female breast: Secondary | ICD-10-CM | POA: Diagnosis not present

## 2024-03-12 DIAGNOSIS — I871 Compression of vein: Secondary | ICD-10-CM | POA: Diagnosis present

## 2024-03-12 DIAGNOSIS — C7802 Secondary malignant neoplasm of left lung: Secondary | ICD-10-CM | POA: Diagnosis not present

## 2024-03-12 DIAGNOSIS — K3189 Other diseases of stomach and duodenum: Secondary | ICD-10-CM | POA: Diagnosis present

## 2024-03-12 DIAGNOSIS — Z8249 Family history of ischemic heart disease and other diseases of the circulatory system: Secondary | ICD-10-CM | POA: Diagnosis not present

## 2024-03-12 DIAGNOSIS — C7951 Secondary malignant neoplasm of bone: Secondary | ICD-10-CM | POA: Diagnosis not present

## 2024-03-12 DIAGNOSIS — R350 Frequency of micturition: Secondary | ICD-10-CM

## 2024-03-12 DIAGNOSIS — J90 Pleural effusion, not elsewhere classified: Secondary | ICD-10-CM | POA: Diagnosis not present

## 2024-03-12 DIAGNOSIS — Z79899 Other long term (current) drug therapy: Secondary | ICD-10-CM

## 2024-03-12 DIAGNOSIS — Z9049 Acquired absence of other specified parts of digestive tract: Secondary | ICD-10-CM

## 2024-03-12 DIAGNOSIS — G9341 Metabolic encephalopathy: Secondary | ICD-10-CM | POA: Diagnosis not present

## 2024-03-12 DIAGNOSIS — R918 Other nonspecific abnormal finding of lung field: Secondary | ICD-10-CM | POA: Diagnosis not present

## 2024-03-12 DIAGNOSIS — C787 Secondary malignant neoplasm of liver and intrahepatic bile duct: Secondary | ICD-10-CM | POA: Diagnosis not present

## 2024-03-12 DIAGNOSIS — E785 Hyperlipidemia, unspecified: Secondary | ICD-10-CM | POA: Diagnosis not present

## 2024-03-12 DIAGNOSIS — R000 Tachycardia, unspecified: Secondary | ICD-10-CM | POA: Diagnosis present

## 2024-03-12 DIAGNOSIS — R3915 Urgency of urination: Secondary | ICD-10-CM | POA: Diagnosis present

## 2024-03-12 DIAGNOSIS — F419 Anxiety disorder, unspecified: Secondary | ICD-10-CM | POA: Diagnosis present

## 2024-03-12 DIAGNOSIS — R7881 Bacteremia: Secondary | ICD-10-CM | POA: Diagnosis present

## 2024-03-12 DIAGNOSIS — A09 Infectious gastroenteritis and colitis, unspecified: Principal | ICD-10-CM | POA: Diagnosis present

## 2024-03-12 DIAGNOSIS — I2489 Other forms of acute ischemic heart disease: Secondary | ICD-10-CM | POA: Diagnosis present

## 2024-03-12 DIAGNOSIS — B951 Streptococcus, group B, as the cause of diseases classified elsewhere: Secondary | ICD-10-CM | POA: Diagnosis present

## 2024-03-12 DIAGNOSIS — Z7901 Long term (current) use of anticoagulants: Secondary | ICD-10-CM | POA: Diagnosis not present

## 2024-03-12 DIAGNOSIS — E876 Hypokalemia: Secondary | ICD-10-CM | POA: Diagnosis present

## 2024-03-12 DIAGNOSIS — R112 Nausea with vomiting, unspecified: Principal | ICD-10-CM | POA: Diagnosis present

## 2024-03-12 DIAGNOSIS — R4182 Altered mental status, unspecified: Secondary | ICD-10-CM | POA: Diagnosis not present

## 2024-03-12 DIAGNOSIS — Z9013 Acquired absence of bilateral breasts and nipples: Secondary | ICD-10-CM

## 2024-03-12 DIAGNOSIS — Z833 Family history of diabetes mellitus: Secondary | ICD-10-CM | POA: Diagnosis not present

## 2024-03-12 DIAGNOSIS — K209 Esophagitis, unspecified without bleeding: Secondary | ICD-10-CM | POA: Diagnosis not present

## 2024-03-12 DIAGNOSIS — I110 Hypertensive heart disease with heart failure: Secondary | ICD-10-CM | POA: Diagnosis not present

## 2024-03-12 DIAGNOSIS — A419 Sepsis, unspecified organism: Secondary | ICD-10-CM | POA: Diagnosis not present

## 2024-03-12 DIAGNOSIS — K2970 Gastritis, unspecified, without bleeding: Secondary | ICD-10-CM | POA: Diagnosis present

## 2024-03-12 DIAGNOSIS — Z86711 Personal history of pulmonary embolism: Secondary | ICD-10-CM

## 2024-03-12 DIAGNOSIS — I5032 Chronic diastolic (congestive) heart failure: Secondary | ICD-10-CM | POA: Diagnosis not present

## 2024-03-12 DIAGNOSIS — Z880 Allergy status to penicillin: Secondary | ICD-10-CM

## 2024-03-12 DIAGNOSIS — M4628 Osteomyelitis of vertebra, sacral and sacrococcygeal region: Secondary | ICD-10-CM | POA: Diagnosis not present

## 2024-03-12 DIAGNOSIS — R590 Localized enlarged lymph nodes: Secondary | ICD-10-CM | POA: Diagnosis not present

## 2024-03-12 DIAGNOSIS — R6510 Systemic inflammatory response syndrome (SIRS) of non-infectious origin without acute organ dysfunction: Secondary | ICD-10-CM

## 2024-03-12 DIAGNOSIS — F39 Unspecified mood [affective] disorder: Secondary | ICD-10-CM | POA: Diagnosis not present

## 2024-03-12 DIAGNOSIS — D849 Immunodeficiency, unspecified: Secondary | ICD-10-CM | POA: Diagnosis not present

## 2024-03-12 DIAGNOSIS — D72829 Elevated white blood cell count, unspecified: Secondary | ICD-10-CM | POA: Diagnosis not present

## 2024-03-12 DIAGNOSIS — M009 Pyogenic arthritis, unspecified: Secondary | ICD-10-CM | POA: Diagnosis present

## 2024-03-12 DIAGNOSIS — R933 Abnormal findings on diagnostic imaging of other parts of digestive tract: Secondary | ICD-10-CM | POA: Diagnosis not present

## 2024-03-12 DIAGNOSIS — R197 Diarrhea, unspecified: Secondary | ICD-10-CM | POA: Diagnosis not present

## 2024-03-12 DIAGNOSIS — Z8619 Personal history of other infectious and parasitic diseases: Secondary | ICD-10-CM

## 2024-03-12 DIAGNOSIS — N739 Female pelvic inflammatory disease, unspecified: Secondary | ICD-10-CM | POA: Diagnosis not present

## 2024-03-12 DIAGNOSIS — Z9221 Personal history of antineoplastic chemotherapy: Secondary | ICD-10-CM

## 2024-03-12 DIAGNOSIS — E66813 Obesity, class 3: Secondary | ICD-10-CM | POA: Diagnosis not present

## 2024-03-12 DIAGNOSIS — R109 Unspecified abdominal pain: Secondary | ICD-10-CM | POA: Diagnosis not present

## 2024-03-12 DIAGNOSIS — Z91410 Personal history of adult physical and sexual abuse: Secondary | ICD-10-CM

## 2024-03-12 DIAGNOSIS — C7801 Secondary malignant neoplasm of right lung: Secondary | ICD-10-CM | POA: Diagnosis present

## 2024-03-12 DIAGNOSIS — Z87440 Personal history of urinary (tract) infections: Secondary | ICD-10-CM

## 2024-03-12 DIAGNOSIS — D649 Anemia, unspecified: Secondary | ICD-10-CM | POA: Diagnosis not present

## 2024-03-12 DIAGNOSIS — R300 Dysuria: Secondary | ICD-10-CM | POA: Diagnosis not present

## 2024-03-12 DIAGNOSIS — Z9109 Other allergy status, other than to drugs and biological substances: Secondary | ICD-10-CM

## 2024-03-12 LAB — CBC WITH DIFFERENTIAL/PLATELET
Abs Immature Granulocytes: 0.1 K/uL — ABNORMAL HIGH (ref 0.00–0.07)
Basophils Absolute: 0.1 K/uL (ref 0.0–0.1)
Basophils Relative: 0 %
Eosinophils Absolute: 0.6 K/uL — ABNORMAL HIGH (ref 0.0–0.5)
Eosinophils Relative: 4 %
HCT: 32.1 % — ABNORMAL LOW (ref 36.0–46.0)
Hemoglobin: 11.4 g/dL — ABNORMAL LOW (ref 12.0–15.0)
Immature Granulocytes: 1 %
Lymphocytes Relative: 15 %
Lymphs Abs: 2.5 K/uL (ref 0.7–4.0)
MCH: 27.5 pg (ref 26.0–34.0)
MCHC: 35.5 g/dL (ref 30.0–36.0)
MCV: 77.5 fL — ABNORMAL LOW (ref 80.0–100.0)
Monocytes Absolute: 1.5 K/uL — ABNORMAL HIGH (ref 0.1–1.0)
Monocytes Relative: 9 %
Neutro Abs: 12 K/uL — ABNORMAL HIGH (ref 1.7–7.7)
Neutrophils Relative %: 71 %
Platelets: 347 K/uL (ref 150–400)
RBC: 4.14 MIL/uL (ref 3.87–5.11)
RDW: 16.6 % — ABNORMAL HIGH (ref 11.5–15.5)
WBC: 16.7 K/uL — ABNORMAL HIGH (ref 4.0–10.5)
nRBC: 0 % (ref 0.0–0.2)

## 2024-03-12 LAB — COMPREHENSIVE METABOLIC PANEL WITH GFR
ALT: 36 U/L (ref 0–44)
AST: 55 U/L — ABNORMAL HIGH (ref 15–41)
Albumin: 2.4 g/dL — ABNORMAL LOW (ref 3.5–5.0)
Alkaline Phosphatase: 61 U/L (ref 38–126)
Anion gap: 16 — ABNORMAL HIGH (ref 5–15)
BUN: 6 mg/dL — ABNORMAL LOW (ref 8–23)
CO2: 22 mmol/L (ref 22–32)
Calcium: 8.5 mg/dL — ABNORMAL LOW (ref 8.9–10.3)
Chloride: 100 mmol/L (ref 98–111)
Creatinine, Ser: 0.93 mg/dL (ref 0.44–1.00)
GFR, Estimated: >60 mL/min (ref >60)
Glucose, Bld: 120 mg/dL — ABNORMAL HIGH (ref 70–99)
Potassium: 2.9 mmol/L — ABNORMAL LOW (ref 3.5–5.1)
Sodium: 138 mmol/L (ref 135–145)
Total Bilirubin: 1.1 mg/dL (ref 0.0–1.2)
Total Protein: 7 g/dL (ref 6.5–8.1)

## 2024-03-12 LAB — I-STAT CG4 LACTIC ACID, ED: Lactic Acid, Venous: 1.4 mmol/L (ref 0.5–1.9)

## 2024-03-12 MED ORDER — IOHEXOL 350 MG/ML SOLN
75.0000 mL | Freq: Once | INTRAVENOUS | Status: AC | PRN
  Administered 2024-03-12: 75 mL via INTRAVENOUS

## 2024-03-12 NOTE — ED Provider Triage Note (Signed)
 Emergency Medicine Provider Triage Evaluation Note  [name] , a 73 y.o. female  was evaluated in triage.  Pt complains of fatigue. Report having abd pain, n/v/d since yesterday.  Hx of breast CA getting chemo.  Also had L hip osteo on IV abx x 6 weeks.    Review of Systems  Positive: As above Negative: As above  Physical Exam  BP (!) 127/90   Pulse (!) 110   Resp 18   Ht 5' 3 (1.6 m)   Wt 117.5 kg   SpO2 97%   BMI 45.88 kg/m  Gen:   Awake, no distress   Resp:  Normal effort  MSK:   Moves extremities without difficulty  Other:    Medical Decision Making  Medically screening exam initiated at 4:35 PM.  Appropriate orders placed.  [name] was informed that the remainder of the evaluation will be completed by another provider, this initial triage assessment does not replace that evaluation, and the importance of remaining in the ED until their evaluation is complete.     Yolanda Colon, PA-C 03/12/24 [phone]

## 2024-03-12 NOTE — ED Triage Notes (Signed)
 Complains of nausea vomiting and not able to keep anything down.  Was recently here for UTI and osteo

## 2024-03-12 NOTE — ED Notes (Signed)
 Pt refused to sign treatment waiver. Pt is increasingly upset with ED wait times. Triage staff attempting to speak with pt about ED workup process.

## 2024-03-12 NOTE — ED Notes (Signed)
 Pt's family is on the phone demanding to speak with charge. I advised family of the ed process. They are ok for now.

## 2024-03-13 DIAGNOSIS — E876 Hypokalemia: Secondary | ICD-10-CM | POA: Diagnosis present

## 2024-03-13 DIAGNOSIS — C50919 Malignant neoplasm of unspecified site of unspecified female breast: Secondary | ICD-10-CM | POA: Diagnosis present

## 2024-03-13 DIAGNOSIS — R933 Abnormal findings on diagnostic imaging of other parts of digestive tract: Secondary | ICD-10-CM

## 2024-03-13 DIAGNOSIS — D649 Anemia, unspecified: Secondary | ICD-10-CM

## 2024-03-13 DIAGNOSIS — R350 Frequency of micturition: Secondary | ICD-10-CM

## 2024-03-13 DIAGNOSIS — K209 Esophagitis, unspecified without bleeding: Secondary | ICD-10-CM

## 2024-03-13 DIAGNOSIS — F39 Unspecified mood [affective] disorder: Secondary | ICD-10-CM | POA: Diagnosis present

## 2024-03-13 DIAGNOSIS — I5032 Chronic diastolic (congestive) heart failure: Secondary | ICD-10-CM | POA: Diagnosis present

## 2024-03-13 DIAGNOSIS — B951 Streptococcus, group B, as the cause of diseases classified elsewhere: Secondary | ICD-10-CM | POA: Diagnosis not present

## 2024-03-13 DIAGNOSIS — C787 Secondary malignant neoplasm of liver and intrahepatic bile duct: Secondary | ICD-10-CM | POA: Diagnosis present

## 2024-03-13 DIAGNOSIS — G9341 Metabolic encephalopathy: Secondary | ICD-10-CM | POA: Diagnosis not present

## 2024-03-13 DIAGNOSIS — I871 Compression of vein: Secondary | ICD-10-CM | POA: Diagnosis present

## 2024-03-13 DIAGNOSIS — R112 Nausea with vomiting, unspecified: Secondary | ICD-10-CM

## 2024-03-13 DIAGNOSIS — R197 Diarrhea, unspecified: Secondary | ICD-10-CM | POA: Diagnosis not present

## 2024-03-13 DIAGNOSIS — E785 Hyperlipidemia, unspecified: Secondary | ICD-10-CM | POA: Diagnosis present

## 2024-03-13 DIAGNOSIS — E66813 Obesity, class 3: Secondary | ICD-10-CM | POA: Diagnosis present

## 2024-03-13 DIAGNOSIS — A09 Infectious gastroenteritis and colitis, unspecified: Secondary | ICD-10-CM | POA: Diagnosis present

## 2024-03-13 DIAGNOSIS — N739 Female pelvic inflammatory disease, unspecified: Secondary | ICD-10-CM | POA: Diagnosis not present

## 2024-03-13 DIAGNOSIS — C7802 Secondary malignant neoplasm of left lung: Secondary | ICD-10-CM | POA: Diagnosis present

## 2024-03-13 DIAGNOSIS — M009 Pyogenic arthritis, unspecified: Secondary | ICD-10-CM | POA: Diagnosis present

## 2024-03-13 DIAGNOSIS — Z833 Family history of diabetes mellitus: Secondary | ICD-10-CM | POA: Diagnosis not present

## 2024-03-13 DIAGNOSIS — I110 Hypertensive heart disease with heart failure: Secondary | ICD-10-CM | POA: Diagnosis present

## 2024-03-13 DIAGNOSIS — K2970 Gastritis, unspecified, without bleeding: Secondary | ICD-10-CM

## 2024-03-13 DIAGNOSIS — R300 Dysuria: Secondary | ICD-10-CM

## 2024-03-13 DIAGNOSIS — D638 Anemia in other chronic diseases classified elsewhere: Secondary | ICD-10-CM | POA: Diagnosis present

## 2024-03-13 DIAGNOSIS — D849 Immunodeficiency, unspecified: Secondary | ICD-10-CM | POA: Diagnosis present

## 2024-03-13 DIAGNOSIS — I2489 Other forms of acute ischemic heart disease: Secondary | ICD-10-CM | POA: Diagnosis present

## 2024-03-13 DIAGNOSIS — R6510 Systemic inflammatory response syndrome (SIRS) of non-infectious origin without acute organ dysfunction: Secondary | ICD-10-CM

## 2024-03-13 DIAGNOSIS — M4628 Osteomyelitis of vertebra, sacral and sacrococcygeal region: Secondary | ICD-10-CM | POA: Diagnosis not present

## 2024-03-13 DIAGNOSIS — C7951 Secondary malignant neoplasm of bone: Secondary | ICD-10-CM | POA: Diagnosis present

## 2024-03-13 DIAGNOSIS — R7881 Bacteremia: Secondary | ICD-10-CM | POA: Diagnosis present

## 2024-03-13 DIAGNOSIS — Z8249 Family history of ischemic heart disease and other diseases of the circulatory system: Secondary | ICD-10-CM | POA: Diagnosis not present

## 2024-03-13 DIAGNOSIS — Z7901 Long term (current) use of anticoagulants: Secondary | ICD-10-CM | POA: Diagnosis not present

## 2024-03-13 DIAGNOSIS — C7801 Secondary malignant neoplasm of right lung: Secondary | ICD-10-CM | POA: Diagnosis present

## 2024-03-13 LAB — CBC
HCT: 28 % — ABNORMAL LOW (ref 36.0–46.0)
Hemoglobin: 10 g/dL — ABNORMAL LOW (ref 12.0–15.0)
MCH: 27.6 pg (ref 26.0–34.0)
MCHC: 35.7 g/dL (ref 30.0–36.0)
MCV: 77.3 fL — ABNORMAL LOW (ref 80.0–100.0)
Platelets: 265 K/uL (ref 150–400)
RBC: 3.62 MIL/uL — ABNORMAL LOW (ref 3.87–5.11)
RDW: 16.6 % — ABNORMAL HIGH (ref 11.5–15.5)
WBC: 13 K/uL — ABNORMAL HIGH (ref 4.0–10.5)
nRBC: 0 % (ref 0.0–0.2)

## 2024-03-13 LAB — C-REACTIVE PROTEIN: CRP: 5.3 mg/dL — ABNORMAL HIGH (ref <1.0)

## 2024-03-13 LAB — COMPREHENSIVE METABOLIC PANEL WITH GFR
ALT: 30 U/L (ref 0–44)
AST: 43 U/L — ABNORMAL HIGH (ref 15–41)
Albumin: 2.1 g/dL — ABNORMAL LOW (ref 3.5–5.0)
Alkaline Phosphatase: 53 U/L (ref 38–126)
Anion gap: 13 (ref 5–15)
BUN: 5 mg/dL — ABNORMAL LOW (ref 8–23)
CO2: 22 mmol/L (ref 22–32)
Calcium: 8 mg/dL — ABNORMAL LOW (ref 8.9–10.3)
Chloride: 103 mmol/L (ref 98–111)
Creatinine, Ser: 0.93 mg/dL (ref 0.44–1.00)
GFR, Estimated: >60 mL/min (ref >60)
Glucose, Bld: 85 mg/dL (ref 70–99)
Potassium: 2.9 mmol/L — ABNORMAL LOW (ref 3.5–5.1)
Sodium: 138 mmol/L (ref 135–145)
Total Bilirubin: 1.2 mg/dL (ref 0.0–1.2)
Total Protein: 6 g/dL — ABNORMAL LOW (ref 6.5–8.1)

## 2024-03-13 LAB — LIPASE, BLOOD: Lipase: 22 U/L (ref 11–51)

## 2024-03-13 LAB — MAGNESIUM: Magnesium: 1.4 mg/dL — ABNORMAL LOW (ref 1.7–2.4)

## 2024-03-13 LAB — C DIFFICILE QUICK SCREEN W PCR REFLEX
C Diff antigen: NEGATIVE
C Diff interpretation: NOT DETECTED
C Diff toxin: NEGATIVE

## 2024-03-13 LAB — TROPONIN I (HIGH SENSITIVITY)
Troponin I (High Sensitivity): 27 ng/L — ABNORMAL HIGH (ref <18)
Troponin I (High Sensitivity): 27 ng/L — ABNORMAL HIGH (ref <18)

## 2024-03-13 LAB — I-STAT CG4 LACTIC ACID, ED: Lactic Acid, Venous: 1.4 mmol/L (ref 0.5–1.9)

## 2024-03-13 LAB — SEDIMENTATION RATE: Sed Rate: 45 mm/h — ABNORMAL HIGH (ref 0–22)

## 2024-03-13 LAB — BRAIN NATRIURETIC PEPTIDE: B Natriuretic Peptide: 53.7 pg/mL (ref 0.0–100.0)

## 2024-03-13 MED ORDER — SODIUM CHLORIDE 0.9 % IV SOLN
12.5000 mg | Freq: Once | INTRAVENOUS | Status: AC
  Administered 2024-03-13: 12.5 mg via INTRAVENOUS
  Filled 2024-03-13: qty 0.5

## 2024-03-13 MED ORDER — AMLODIPINE BESYLATE 10 MG PO TABS
10.0000 mg | ORAL_TABLET | Freq: Every morning | ORAL | Status: DC
  Administered 2024-03-13 – 2024-03-17 (×5): 10 mg via ORAL
  Filled 2024-03-13 (×2): qty 1
  Filled 2024-03-13: qty 2
  Filled 2024-03-13 (×2): qty 1

## 2024-03-13 MED ORDER — POTASSIUM CHLORIDE 10 MEQ/100ML IV SOLN
10.0000 meq | INTRAVENOUS | Status: AC
  Administered 2024-03-13 (×2): 10 meq via INTRAVENOUS
  Filled 2024-03-13: qty 100

## 2024-03-13 MED ORDER — POTASSIUM CHLORIDE 10 MEQ/100ML IV SOLN
10.0000 meq | INTRAVENOUS | Status: AC
  Administered 2024-03-13: 10 meq via INTRAVENOUS
  Filled 2024-03-13: qty 100

## 2024-03-13 MED ORDER — POTASSIUM CHLORIDE 10 MEQ/100ML IV SOLN
10.0000 meq | INTRAVENOUS | Status: AC
  Administered 2024-03-13 (×4): 10 meq via INTRAVENOUS
  Filled 2024-03-13 (×2): qty 100

## 2024-03-13 MED ORDER — CEFTRIAXONE IV (FOR PTA / DISCHARGE USE ONLY): 2.0000 g | INTRAVENOUS | Status: DC

## 2024-03-13 MED ORDER — TEMAZEPAM 30 MG PO CAPS
30.0000 mg | ORAL_CAPSULE | Freq: Every day | ORAL | Status: DC
  Administered 2024-03-13: 30 mg via ORAL
  Filled 2024-03-13: qty 1
  Filled 2024-03-13: qty 4

## 2024-03-13 MED ORDER — DAPTOMYCIN IV (FOR PTA / DISCHARGE USE ONLY): 700.0000 mg | INTRAVENOUS | Status: DC

## 2024-03-13 MED ORDER — SODIUM CHLORIDE 0.9 % IV SOLN
2.0000 g | INTRAVENOUS | Status: DC
  Administered 2024-03-13 – 2024-03-17 (×5): 2 g via INTRAVENOUS
  Filled 2024-03-13 (×5): qty 20

## 2024-03-13 MED ORDER — DAPTOMYCIN-SODIUM CHLORIDE 700-0.9 MG/100ML-% IV SOLN
700.0000 mg | Freq: Every day | INTRAVENOUS | Status: DC
  Administered 2024-03-13 – 2024-03-17 (×5): 700 mg via INTRAVENOUS
  Filled 2024-03-13 (×5): qty 100

## 2024-03-13 MED ORDER — PANTOPRAZOLE SODIUM 40 MG IV SOLR
40.0000 mg | Freq: Two times a day (BID) | INTRAVENOUS | Status: DC
  Administered 2024-03-13 – 2024-03-16 (×7): 40 mg via INTRAVENOUS
  Filled 2024-03-13 (×8): qty 10

## 2024-03-13 MED ORDER — ONDANSETRON HCL 4 MG/2ML IJ SOLN
4.0000 mg | Freq: Once | INTRAMUSCULAR | Status: AC
  Administered 2024-03-13: 4 mg via INTRAVENOUS
  Filled 2024-03-13: qty 2

## 2024-03-13 MED ORDER — SODIUM CHLORIDE 0.9 % IV SOLN: INTRAVENOUS | Status: AC

## 2024-03-13 MED ORDER — DULOXETINE HCL 60 MG PO CPEP
60.0000 mg | ORAL_CAPSULE | Freq: Every day | ORAL | Status: DC
  Administered 2024-03-13 – 2024-03-17 (×5): 60 mg via ORAL
  Filled 2024-03-13: qty 1
  Filled 2024-03-13: qty 2
  Filled 2024-03-13 (×3): qty 1

## 2024-03-13 MED ORDER — TEMAZEPAM 7.5 MG PO CAPS
30.0000 mg | ORAL_CAPSULE | Freq: Every day | ORAL | Status: DC
  Administered 2024-03-14 – 2024-03-16 (×3): 30 mg via ORAL
  Filled 2024-03-13 (×3): qty 4

## 2024-03-13 MED ORDER — ACETAMINOPHEN 650 MG RE SUPP: 650.0000 mg | Freq: Four times a day (QID) | RECTAL | Status: DC | PRN

## 2024-03-13 MED ORDER — ROSUVASTATIN CALCIUM 5 MG PO TABS
10.0000 mg | ORAL_TABLET | Freq: Every day | ORAL | Status: DC
  Administered 2024-03-13 – 2024-03-17 (×5): 10 mg via ORAL
  Filled 2024-03-13 (×5): qty 2

## 2024-03-13 MED ORDER — APIXABAN 5 MG PO TABS: 5.0000 mg | ORAL_TABLET | Freq: Two times a day (BID) | ORAL | Status: DC

## 2024-03-13 MED ORDER — ONDANSETRON HCL 4 MG/2ML IJ SOLN
4.0000 mg | Freq: Four times a day (QID) | INTRAMUSCULAR | Status: DC | PRN
  Administered 2024-03-13 – 2024-03-14 (×4): 4 mg via INTRAVENOUS
  Filled 2024-03-13 (×4): qty 2

## 2024-03-13 MED ORDER — LACTATED RINGERS IV BOLUS
1000.0000 mL | Freq: Once | INTRAVENOUS | Status: AC
  Administered 2024-03-13: 1000 mL via INTRAVENOUS

## 2024-03-13 MED ORDER — CARVEDILOL 12.5 MG PO TABS
12.5000 mg | ORAL_TABLET | Freq: Two times a day (BID) | ORAL | Status: DC
  Administered 2024-03-13 – 2024-03-17 (×9): 12.5 mg via ORAL
  Filled 2024-03-13 (×9): qty 1

## 2024-03-13 MED ORDER — MAGNESIUM SULFATE 2 GM/50ML IV SOLN
2.0000 g | Freq: Once | INTRAVENOUS | Status: AC
  Administered 2024-03-13: 2 g via INTRAVENOUS
  Filled 2024-03-13: qty 50

## 2024-03-13 MED ORDER — ACETAMINOPHEN 325 MG PO TABS
650.0000 mg | ORAL_TABLET | Freq: Four times a day (QID) | ORAL | Status: DC | PRN
  Administered 2024-03-13 – 2024-03-17 (×3): 650 mg via ORAL
  Filled 2024-03-13 (×3): qty 2

## 2024-03-13 MED ORDER — ALPRAZOLAM 0.5 MG PO TABS
1.0000 mg | ORAL_TABLET | Freq: Three times a day (TID) | ORAL | Status: DC | PRN
  Administered 2024-03-14 – 2024-03-16 (×3): 1 mg via ORAL
  Filled 2024-03-13 (×3): qty 2
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
## 2024-03-13 NOTE — ED Notes (Signed)
 Pt had an episode of vomiting after taking morning medications. Md Gherghe notified.

## 2024-03-13 NOTE — ED Notes (Signed)
 CCMD called. Pt added on monitor by Louise.

## 2024-03-13 NOTE — ED Notes (Signed)
 Assuming care of pt. Report from Almarie Chute, RN

## 2024-03-13 NOTE — ED Provider Notes (Signed)
 [place] EMERGENCY DEPARTMENT AT [facility] Provider Note   CSN: [id number] Arrival date & time: 03/12/24  1526     Patient presents with: Abdominal Pain   [name] is a 73 y.o. female.   72 y.o. F with BrCA metastatic to liver and bone, on Trastuzumab , MO, dCHF, hx PE on Eliquis , and HTN presents with nausea, vomiting, diarrhea, abdominal pain.  Symptoms ongoing for the past 48 hours.  Reports no sick contacts at home or recent travel.  Notably she has been taking IV antibiotics for a known sacroiliac abscess and septic joint.  Reports compliance with her antibiotics but nausea vomiting and diarrhea are new within the past 2 days.  Unable to keep anything down at home.  She has diffuse abdominal pain, multiple doses of vomiting today times about 4.  Emesis has been clear.  Denies any blood in her emesis.  Has had several episodes of nonbloody diarrhea as well.  Diffuse abdominal pain.  Her left hip pain is not substantially changed.  Denies any travel or sick contacts.  Denies any chest pain or shortness of breath.  Does have pain in her throat from vomiting.  The history is provided by the patient.  Abdominal Pain Associated symptoms: diarrhea, nausea and vomiting   Associated symptoms: no dysuria, no fatigue, no fever, no hematuria and no shortness of breath        Prior to Admission medications   Medication Sig Start Date End Date Taking? Authorizing Provider  acetaminophen  (TYLENOL ) 500 MG tablet Take 500-1,000 mg by mouth every 6 (six) hours as needed for mild pain (pain score 1-3), fever or moderate pain (pain score 4-6).    [provider]  albuterol  (VENTOLIN  HFA) 108 (90 Base) MCG/ACT inhaler Inhale 2 puffs into the lungs every 6 (six) hours as needed for wheezing. 06/28/22   Gudena, Vinay, MD  ALPRAZolam  (XANAX ) 1 MG tablet Take 1 tablet (1 mg total) by mouth 3 (three) times daily as needed for anxiety. 12/31/23   Gudena, Vinay, MD  amLODipine   (NORVASC ) 10 MG tablet Take 1 tablet (10 mg total) by mouth every morning. 09/10/19   Magrinat, Sandria BROCKS, MD  apixaban  (ELIQUIS ) 5 MG TABS tablet Take 1 tablet (5 mg total) by mouth 2 (two) times daily. 08/06/23   Gudena, Vinay, MD  baclofen  (LIORESAL ) 10 MG tablet TAKE 1 TABLET BY MOUTH THREE TIMES DAILY AS NEEDED FOR MUSCLE SPASMS 05/19/21   Magrinat, Sandria BROCKS, MD  CALCIUM  PO Take 2 tablets by mouth daily.    [provider]  carvedilol  (COREG ) 12.5 MG tablet Take 1 tablet (12.5 mg total) by mouth 2 (two) times daily with a meal. 02/20/24   Perri DELENA Meliton Mickey., MD  cefTRIAXone  (ROCEPHIN ) IVPB Inject 2 g into the vein daily. Indication:  L-iliac fossa abscess First Dose: Yes Last Day of Therapy:  03/30/24 Labs - Once weekly:  CBC/D and BMP, Labs - Once weekly: ESR and CRP Method of administration: IV Push Method of administration may be changed at the discretion of home infusion pharmacist based upon assessment of the patient and/or caregiver's ability to self-administer the medication ordered. 02/20/24 03/30/24  Perri DELENA Meliton Mickey., MD  cyclobenzaprine  (FLEXERIL ) 5 MG tablet Take 1 tablet (5 mg total) by mouth 2 (two) times daily as needed for muscle spasms. 12/31/23   Gudena, Vinay, MD  daptomycin  (CUBICIN ) IVPB Inject 700 mg into the vein daily. Indication:  L-iliac fossa abscess First Dose: Yes Last  Day of Therapy:  03/30/24 Labs - Once weekly:  CBC/D, BMP, and CPK Labs - Once weekly: ESR and CRP Method of administration: IV Push Method of administration may be changed at the discretion of home infusion pharmacist based upon assessment of the patient and/or caregiver's ability to self-administer the medication ordered. 02/20/24 03/30/24  Perri DELENA Meliton Mickey., MD  DULoxetine  (CYMBALTA ) 60 MG capsule Take 60 mg by mouth daily. 12/17/22   [provider]  furosemide  (LASIX ) 40 MG tablet Take 40 mg by mouth as needed for fluid or edema.    [provider]  gabapentin   (NEURONTIN ) 300 MG capsule TAKE 2 CAPSULES BY MOUTH THREE TIMES DAILY Patient taking differently: Take 600 mg by mouth 3 (three) times daily as needed (Pain). TAKE 2 CAPSULES BY MOUTH THREE TIMES DAILY 04/15/23   Gudena, Vinay, MD  losartan  (COZAAR ) 100 MG tablet Take 100 mg by mouth every morning.    [provider]  ondansetron  (ZOFRAN ) 8 MG tablet Take 1 tablet (8 mg total) by mouth every 8 (eight) hours as needed for nausea or vomiting. 01/21/24   Laurence Locus, DO  Oxycodone  HCl 10 MG TABS Take 10 mg by mouth every 6 (six) hours as needed (Pain). 01/24/24   [provider]  pantoprazole  (PROTONIX ) 40 MG tablet Take 1 tablet (40 mg total) by mouth daily. Patient not taking: Reported on 02/14/2024 01/21/24 02/20/24  Laurence Locus, DO  predniSONE (DELTASONE) 5 MG tablet Take 5 mg by mouth daily as needed (inflammation).    [provider]  rosuvastatin  (CRESTOR ) 10 MG tablet Take 1 tablet (10 mg total) by mouth daily. 11/14/23 11/13/24  Rolan Ezra RAMAN, MD  spironolactone  (ALDACTONE ) 25 MG tablet Take 1 tablet daily 01/24/24   Rolan Ezra RAMAN, MD  temazepam  (RESTORIL ) 30 MG capsule Take 1 capsule (30 mg total) by mouth at bedtime. 01/28/24   Crawford Morna Pickle, NP    Allergies: Adhesive [tape] and Penicillins    Review of Systems  Constitutional:  Positive for activity change and appetite change. Negative for fatigue and fever.  HENT:  Positive for congestion.   Respiratory:  Negative for chest tightness and shortness of breath.   Gastrointestinal:  Positive for abdominal pain, diarrhea, nausea and vomiting.  Genitourinary:  Negative for dysuria and hematuria.  Musculoskeletal:  Positive for arthralgias and myalgias.  Neurological:  Positive for weakness. Negative for headaches.   all other systems are negative except as noted in the HPI and PMH.    Updated Vital Signs BP 125/78   Pulse (!) 106   Temp 98.4 F (36.9 C)   Resp 18   Ht 5' 3 (1.6 m)   Wt 117.5 kg    SpO2 99%   BMI 45.88 kg/m   Physical Exam Vitals and nursing note reviewed.  Constitutional:      General: She is not in acute distress.    Appearance: She is well-developed.  HENT:     Head: Normocephalic and atraumatic.     Mouth/Throat:     Pharynx: No oropharyngeal exudate.  Eyes:     Conjunctiva/sclera: Conjunctivae normal.     Pupils: Pupils are equal, round, and reactive to light.  Neck:     Comments: No meningismus. Cardiovascular:     Rate and Rhythm: Regular rhythm. Tachycardia present.     Heart sounds: Normal heart sounds. No murmur heard. Pulmonary:     Effort: Pulmonary effort is normal. No respiratory distress.     Breath  sounds: Normal breath sounds.  Abdominal:     Palpations: Abdomen is soft.     Tenderness: There is abdominal tenderness. There is no guarding or rebound.     Comments: Diffuse tenderness, no guarding or rebound  Musculoskeletal:        General: Tenderness present. Normal range of motion.     Cervical back: Normal range of motion and neck supple.     Comments:  left paraspinal lumbar tender Intact DP PT pulses  Skin:    General: Skin is warm.  Neurological:     Mental Status: She is alert and oriented to person, place, and time.     Cranial Nerves: No cranial nerve deficit.     Motor: No abnormal muscle tone.     Coordination: Coordination normal.     Comments:  5/5 strength throughout. CN 2-12 intact.Equal grip strength.   Psychiatric:        Behavior: Behavior normal.     (all labs ordered are listed, but only abnormal results are displayed) Labs Reviewed  COMPREHENSIVE METABOLIC PANEL WITH GFR - Abnormal; Notable for the following components:      Result Value   Potassium 2.9 (*)    Glucose, Bld 120 (*)    BUN 6 (*)    Calcium  8.5 (*)    Albumin 2.4 (*)    AST 55 (*)    Anion gap 16 (*)    All other components within normal limits  CBC WITH DIFFERENTIAL/PLATELET - Abnormal; Notable for the following components:   WBC 16.7  (*)    Hemoglobin 11.4 (*)    HCT 32.1 (*)    MCV 77.5 (*)    RDW 16.6 (*)    Neutro Abs 12.0 (*)    Monocytes Absolute 1.5 (*)    Eosinophils Absolute 0.6 (*)    Abs Immature Granulocytes 0.10 (*)    All other components within normal limits  CULTURE, BLOOD (ROUTINE X 2)  CULTURE, BLOOD (ROUTINE X 2)  C DIFFICILE QUICK SCREEN W PCR REFLEX    URINALYSIS, W/ REFLEX TO CULTURE (INFECTION SUSPECTED)  SEDIMENTATION RATE  C-REACTIVE PROTEIN  LIPASE, BLOOD  I-STAT CG4 LACTIC ACID, ED  I-STAT CG4 LACTIC ACID, ED  TROPONIN I (HIGH SENSITIVITY)    EKG: EKG Interpretation Date/Time:  Thursday March 12 2024 15:36:00 EDT Ventricular Rate:  111 PR Interval:  189 QRS Duration:  86 QT Interval:  319 QTC Calculation: 434 R Axis:   24  Text Interpretation: Sinus tachycardia Borderline repolarization abnormality When compared with ECG of 02/13/2024, HEART RATE has increased REPOLARIZATION ABNORMALITY is now present Confirmed by Raford Lenis ([id number]) on 03/13/2024 2:47:02 AM  Radiology: CT CHEST ABDOMEN PELVIS W CONTRAST Result Date: 03/12/2024 CLINICAL DATA:  Sepsis, abdominal pain, metastatic breast cancer * Tracking Code: BO * EXAM: CT CHEST, ABDOMEN, AND PELVIS WITH CONTRAST TECHNIQUE: Multidetector CT imaging of the chest, abdomen and pelvis was performed following the standard protocol during bolus administration of intravenous contrast. RADIATION DOSE REDUCTION: This exam was performed according to the departmental dose-optimization program which includes automated exposure control, adjustment of the mA and/or kV according to patient size and/or use of iterative reconstruction technique. CONTRAST:  75mL OMNIPAQUE  IOHEXOL  350 MG/ML SOLN COMPARISON:  MR left hip, 02/12/2024, CT chest abdomen pelvis, 11/14/2023 FINDINGS: CT CHEST FINDINGS Cardiovascular: Right chest port catheter. Normal heart size. No pericardial effusion. Mediastinum/Nodes: No enlarged mediastinal, hilar, or axillary lymph nodes.  Diffuse circumferential wall thickening of the esophagus (series 3, image  31, series 7, image 75). Thyroid and trachea without significant findings. Lungs/Pleura: New trace right pleural effusion. Subpleural radiation fibrosis of the anterior right lung. Musculoskeletal: Status post bilateral mastectomy and bilateral axillary lymph node dissection. No acute osseous findings. Partially callused subacute fractures of the anterolateral right ribs (series 5, image 62). Partially callused, subacute to chronic nondisplaced fracture of the sternal body (series 7, image 73). CT ABDOMEN PELVIS FINDINGS Hepatobiliary: No solid liver abnormality. Focal fatty deposition adjacent to the falciform ligament, characteristic in appearance and location. Tiny cyst of the anterior liver (series 3, image 48). Status post cholecystectomy. Mild postoperative biliary ductal dilatation. Pancreas: Unremarkable. No pancreatic ductal dilatation or surrounding inflammatory changes. Spleen: Normal in size without significant abnormality. Adrenals/Urinary Tract: Adrenal glands are unremarkable. Simple, benign right renal cortical cysts for which no further follow-up or characterization is required. Kidneys are otherwise normal, without renal calculi, solid lesion, or hydronephrosis. Bladder is unremarkable. Stomach/Bowel: Stomach is within normal limits. Appendix not clearly visualized. No evidence of bowel wall thickening, distention, or inflammatory changes. Vascular/Lymphatic: No significant vascular findings are present. No enlarged abdominal or pelvic lymph nodes. Reproductive: Calcified uterine fibroids. Other: No abdominal wall hernia or abnormality. No ascites. Musculoskeletal: Interval increase in bony resorption and sclerosis about the left sacroiliac joint (series 3, image 92). IMPRESSION: 1. Diffuse circumferential wall thickening of the esophagus, consistent with nonspecific infectious or inflammatory esophagitis. 2. New trace right  pleural effusion. 3. Interval increase in bony resorption and sclerosis about the left sacroiliac joint, concerning for ongoing septic arthritis. 4. Status post bilateral mastectomy and bilateral axillary lymph node dissection. No evidence of lymphadenopathy or metastatic disease in the chest, abdomen, or pelvis. Electronically Signed   By: Marolyn JONETTA Jaksch M.D.   On: 03/12/2024 21:31   DG Chest 2 View Result Date: 03/12/2024 EXAM: 2 VIEW(S) XRAY OF THE CHEST 03/12/2024 05:15:05 PM COMPARISON: None available. CLINICAL HISTORY: A 73 y.o. female was evaluated in triage. Pt complains of fatigue. Report having abd pain, n/v/d since yesterday. Hx of breast CA getting chemo. Also had L hip osteo on IV abx x 6 weeks. FINDINGS: LUNGS AND PLEURA: Bibasilar atelectasis or infiltrates. Round nodule or opacity projecting over the right hilum may be the pulmonary artery, seen en face; lymphadenopathy or pulmonary nodule are not excluded. Consider CT for further evaluation. No pleural effusion or pneumothorax. HEART AND MEDIASTINUM: No acute abnormality of the cardiac and mediastinal silhouettes. BONES AND SOFT TISSUES: No acute osseous abnormality. LINES AND TUBES: Right chest wall port-a-cath with tip in the right atrium. IMPRESSION: 1. Round nodule or opacity projecting over the right hilum. Differential includes pulmonary artery, lymphadenopathy, or pulmonary nodule. Consider CT for further evaluation. 2. Bibasilar atelectasis or infiltrates. Electronically signed by: Norman Gatlin MD 03/12/2024 05:23 PM EDT RP Workstation: [id number]     Procedures   Medications Ordered in the ED  potassium chloride  10 mEq in 100 mL IVPB (has no administration in time range)  iohexol  (OMNIPAQUE ) 350 MG/ML injection 75 mL (75 mLs Intravenous Contrast Given 03/12/24 2117)  lactated ringers  bolus 1,000 mL (1,000 mLs Intravenous New Bag/Given 03/13/24 0046)  ondansetron  (ZOFRAN ) injection 4 mg (4 mg Intravenous Given 03/13/24 0042)                                     Medical Decision Making Amount and/or Complexity of Data Reviewed Labs: ordered. Decision-making details documented in ED Course.  Radiology: ordered and independent interpretation performed. Decision-making details documented in ED Course. ECG/medicine tests: ordered and independent interpretation performed. Decision-making details documented in ED Course.  Risk Prescription drug management. Decision regarding hospitalization.   48 hours of nausea, vomiting, diarrhea.  On antibiotics for left hip infection.  She is tachycardic on arrival but nontoxic-appearing.  Abdomen is soft without peritoneal signs.  Labs in triage show mild hypokalemia.  Does have leukocytosis.  Lactate is normal.  LFTs and lipase are normal.  Given her recent antibiotic use we will check C. difficile.  CT scan obtained in triage shows evidence of esophagitis as well as persistent likely septic arthritis of the left sacroiliac joint. Lung nodule on xray not appreciated on CT>  Will hydrate.  Labs do not show any AKI or LFT changes.  Previous cholecystectomy.  Hemoglobin is stable with lower concern for GI bleed at this time She did have CT-guided aspiration of this joint and has been on daptomycin /ceftriaxone  until August 4.  Patient continues to feel poorly with nausea.  She is confident that there is no blood in her vomit or stool.  Reports no black or bloody stools or blood in her emesis.  Hemoglobin is actually improved from last month.  May have some hemoconcentration. she did have EGD in May that showed no evidence of esophagitis or ulcers.  Do not suspect GI bleed at this time. She declines rectal exam.  Will hydrate and replace potassium. HIgh concern for C dif given ongoing antibiotic use and immunosuppresion.  Continues to have nausea and vomiting after multiple medications, tachycardia improving.  No BM in the ED. Admission for ongoing care d/w Dr. [place].      Final diagnoses:  Nausea vomiting and diarrhea  Hypokalemia    ED Discharge Orders     None          Caytlin Better, Garnette, MD 03/13/24 [phone]

## 2024-03-13 NOTE — H&P (Signed)
 History and Physical    [name] FMW:[id number] DOB: [birth year] DOA: 03/12/2024  PCP: Yolanda Fitch, MD  Patient coming from: Home  Chief Complaint: Vomiting, diarrhea, abdominal pain  HPI: [name] is a 73 y.o. female with medical history significant of metastatic breast cancer on trastuzumab , chronic HFpEF, history of PE on Eliquis , hypertension, hyperlipidemia, anxiety, class III obesity.  Recent hospital admission 6/19-6/26 for septic arthritis of sacroiliac joint, sacral osteomyelitis, left iliacus abscess with myositis, group B strep bacteremia, E. coli UTI.  Patient underwent CT-guided aspiration of left iliacus abscess.  Discharged home on daptomycin  and ceftriaxone  via port x 6 weeks (end date 8/4) per infectious disease recommendation.  Patient presents to the ED with complaints of nausea, nonbloody emesis, nonbloody diarrhea, and generalized abdominal pain x 2 days.  She has not been able to tolerate any p.o. intake.  Reports having chills.  She is endorsing urinary frequency and urgency.  Denies any recent sick contacts.  She is receiving IV antibiotics at home due to history of recent septic arthritis.  Denies shortness of breath or chest pain.  ED Course: Tachycardic to 110s on arrival but afebrile and not hypotensive.  Labs notable for WBC count of 16.7, hemoglobin 11.4 (improved), potassium 2.9, AST 55 and remainder of LFTs normal, lipase normal, lactic acid normal x 2, blood cultures in process, UA pending, C. difficile PCR pending, ESR 45, CRP 5.3, initial troponin 27 and repeat pending.  EKG showing sinus tachycardia and no STEMI.    CT chest/abdomen/pelvis with contrast showing diffuse circumferential wall thickening of the esophagus consistent with nonspecific infectious or inflammatory esophagitis.  New trace right pleural effusion.  Findings consistent with ongoing septic arthritis of the left sacroiliac joint.  Patient was given Zofran , 1 L LR, IV  potassium 10 mEq x 3, and Phenergan .  Tachycardia improved after IV fluids.  Review of Systems:  Review of Systems  All other systems reviewed and are negative.   Past Medical History:  Diagnosis Date   Anemia    pt pt report   Anxiety    Breast cancer (HCC)    mets to liver and lung   Breast cancer metastasized to multiple sites [facility]) 02/26/2013   Cellulitis    CHF (congestive heart failure) (HCC)    History of chemotherapy 09/2004   taxotere/herceptin /carboplatin   Hypertension    Neuropathy    Radiation 07/31/2006   left upper chest   Radiation 06/17/2006-06/27/2006   6480 cGy bilat. chest wall   SVC syndrome    Thrombosis     Past Surgical History:  Procedure Laterality Date   ANKLE SURGERY Left    BACK SURGERY     CHOLECYSTECTOMY  08/28/1987   ESOPHAGOGASTRODUODENOSCOPY Left 01/21/2024   Procedure: EGD (ESOPHAGOGASTRODUODENOSCOPY);  Surgeon: Yolanda Fallow, MD;  Location: [facility];  Service: Gastroenterology;  Laterality: Left;   MASS EXCISION Left 05/10/2022   Procedure: EXCISION OF LEFT CHEST WALL MASS;  Surgeon: Yolanda Ned, MD;  Location: [facility] OR;  Service: General;  Laterality: Left;   MASTECTOMY Bilateral    w/ lymph node removal per patient   PERIPHERALLY INSERTED CENTRAL CATHETER INSERTION     PICC LINE INSERTION     PICC LINE REMOVAL ([facility] HX)     SURGICAL EXCISION OF EXCESSIVE SKIN     TUBAL LIGATION  08/27/1984     reports that she has never smoked. She has never been exposed to tobacco smoke. She has never used smokeless tobacco. She reports  current alcohol  use. She reports that she does not use drugs.  Allergies  Allergen Reactions   Adhesive [Tape] Other (See Comments)    Tears the skin    Penicillins Hives and Other (See Comments)    PATIENT HAS TOLERATED Cephalosporins    Family History  Problem Relation Age of Onset   Heart failure Father    Cancer Father        Prostate cancer   Heart failure Brother    Cancer Brother         Prostate cancer   Diabetes Maternal Aunt     Prior to Admission medications   Medication Sig Start Date End Date Taking? Authorizing Provider  acetaminophen  (TYLENOL ) 500 MG tablet Take 500-1,000 mg by mouth every 6 (six) hours as needed for mild pain (pain score 1-3), fever or moderate pain (pain score 4-6).    [provider]  albuterol  (VENTOLIN  HFA) 108 (90 Base) MCG/ACT inhaler Inhale 2 puffs into the lungs every 6 (six) hours as needed for wheezing. 06/28/22   Gudena, Vinay, MD  ALPRAZolam  (XANAX ) 1 MG tablet Take 1 tablet (1 mg total) by mouth 3 (three) times daily as needed for anxiety. 12/31/23   Gudena, Vinay, MD  amLODipine  (NORVASC ) 10 MG tablet Take 1 tablet (10 mg total) by mouth every morning. 09/10/19   Magrinat, Yolanda BROCKS, MD  apixaban  (ELIQUIS ) 5 MG TABS tablet Take 1 tablet (5 mg total) by mouth 2 (two) times daily. 08/06/23   Yolanda Potts, MD  baclofen  (LIORESAL ) 10 MG tablet TAKE 1 TABLET BY MOUTH THREE TIMES DAILY AS NEEDED FOR MUSCLE SPASMS 05/19/21   Magrinat, Yolanda BROCKS, MD  CALCIUM  PO Take 2 tablets by mouth daily.    [provider]  carvedilol  (COREG ) 12.5 MG tablet Take 1 tablet (12.5 mg total) by mouth 2 (two) times daily with a meal. 02/20/24   Yolanda Yolanda Meliton Mickey., MD  cefTRIAXone  (ROCEPHIN ) IVPB Inject 2 g into the vein daily. Indication:  L-iliac fossa abscess First Dose: Yes Last Day of Therapy:  03/30/24 Labs - Once weekly:  CBC/D and BMP, Labs - Once weekly: ESR and CRP Method of administration: IV Push Method of administration may be changed at the discretion of home infusion pharmacist based upon assessment of the patient and/or caregiver's ability to self-administer the medication ordered. 02/20/24 03/30/24  Yolanda Yolanda Meliton Mickey., MD  cyclobenzaprine  (FLEXERIL ) 5 MG tablet Take 1 tablet (5 mg total) by mouth 2 (two) times daily as needed for muscle spasms. 12/31/23   Gudena, Vinay, MD  daptomycin  (CUBICIN ) IVPB Inject 700 mg into the vein daily.  Indication:  L-iliac fossa abscess First Dose: Yes Last Day of Therapy:  03/30/24 Labs - Once weekly:  CBC/D, BMP, and CPK Labs - Once weekly: ESR and CRP Method of administration: IV Push Method of administration may be changed at the discretion of home infusion pharmacist based upon assessment of the patient and/or caregiver's ability to self-administer the medication ordered. 02/20/24 03/30/24  Yolanda Yolanda Meliton Mickey., MD  DULoxetine  (CYMBALTA ) 60 MG capsule Take 60 mg by mouth daily. 12/17/22   [provider]  furosemide  (LASIX ) 40 MG tablet Take 40 mg by mouth as needed for fluid or edema.    [provider]  gabapentin  (NEURONTIN ) 300 MG capsule TAKE 2 CAPSULES BY MOUTH THREE TIMES DAILY Patient taking differently: Take 600 mg by mouth 3 (three) times daily as needed (Pain). TAKE 2 CAPSULES BY MOUTH THREE TIMES DAILY 04/15/23  Gudena, Vinay, MD  losartan  (COZAAR ) 100 MG tablet Take 100 mg by mouth every morning.    [provider]  ondansetron  (ZOFRAN ) 8 MG tablet Take 1 tablet (8 mg total) by mouth every 8 (eight) hours as needed for nausea or vomiting. 01/21/24   Laurence Locus, DO  Oxycodone  HCl 10 MG TABS Take 10 mg by mouth every 6 (six) hours as needed (Pain). 01/24/24   [provider]  pantoprazole  (PROTONIX ) 40 MG tablet Take 1 tablet (40 mg total) by mouth daily. Patient not taking: Reported on 02/14/2024 01/21/24 02/20/24  Laurence Locus, DO  predniSONE (DELTASONE) 5 MG tablet Take 5 mg by mouth daily as needed (inflammation).    [provider]  rosuvastatin  (CRESTOR ) 10 MG tablet Take 1 tablet (10 mg total) by mouth daily. 11/14/23 11/13/24  Rolan Ezra RAMAN, MD  spironolactone  (ALDACTONE ) 25 MG tablet Take 1 tablet daily 01/24/24   McLean, Dalton S, MD  temazepam  (RESTORIL ) 30 MG capsule Take 1 capsule (30 mg total) by mouth at bedtime. 01/28/24   Crawford Morna Pickle, NP    Physical Exam: Vitals:   03/12/24 1834 03/13/24 0015 03/13/24 0030  03/13/24 0127  BP: 136/80 108/61 125/78   Pulse: (!) 118 (!) 105 (!) 106   Resp: 18  18   Temp: 98.4 F (36.9 C)   98.7 F (37.1 C)  TempSrc:    Oral  SpO2: 95% 99% 99%   Weight:      Height:        Physical Exam Vitals reviewed.  Constitutional:      General: She is not in acute distress. HENT:     Head: Normocephalic and atraumatic.     Mouth/Throat:     Mouth: Mucous membranes are dry.  Eyes:     Extraocular Movements: Extraocular movements intact.  Cardiovascular:     Rate and Rhythm: Regular rhythm. Tachycardia present.     Pulses: Normal pulses.  Pulmonary:     Effort: Pulmonary effort is normal. No respiratory distress.     Breath sounds: No wheezing, rhonchi or rales.  Abdominal:     General: Bowel sounds are normal. There is no distension.     Palpations: Abdomen is soft.     Tenderness: There is no abdominal tenderness. There is no guarding.  Musculoskeletal:     Cervical back: Normal range of motion.     Comments: Bilateral nonpitting pedal edema  Skin:    General: Skin is warm and dry.  Neurological:     General: No focal deficit present.     Mental Status: She is alert and oriented to person, place, and time.     Labs on Admission: I have personally reviewed following labs and imaging studies  CBC: Recent Labs  Lab 03/12/24 1653  WBC 16.7*  NEUTROABS 12.0*  HGB 11.4*  HCT 32.1*  MCV 77.5*  PLT 347   Basic Metabolic Panel: Recent Labs  Lab 03/12/24 1653  NA 138  K 2.9*  CL 100  CO2 22  GLUCOSE 120*  BUN 6*  CREATININE 0.93  CALCIUM  8.5*   GFR: Estimated Creatinine Clearance: 67.7 mL/min (by C-G formula based on SCr of 0.93 mg/dL). Liver Function Tests: Recent Labs  Lab 03/12/24 1653  AST 55*  ALT 36  ALKPHOS 61  BILITOT 1.1  PROT 7.0  ALBUMIN 2.4*   Recent Labs  Lab 03/13/24 0108  LIPASE 22   No results for input(s): AMMONIA in the last 168 hours.  Coagulation Profile: No results for input(s): INR, PROTIME in  the last 168 hours. Cardiac Enzymes: No results for input(s): CKTOTAL, CKMB, CKMBINDEX, TROPONINI in the last 168 hours. BNP (last 3 results) No results for input(s): PROBNP in the last 8760 hours. HbA1C: No results for input(s): HGBA1C in the last 72 hours. CBG: No results for input(s): GLUCAP in the last 168 hours. Lipid Profile: No results for input(s): CHOL, HDL, LDLCALC, TRIG, CHOLHDL, LDLDIRECT in the last 72 hours. Thyroid Function Tests: No results for input(s): TSH, T4TOTAL, FREET4, T3FREE, THYROIDAB in the last 72 hours. Anemia Panel: No results for input(s): VITAMINB12, FOLATE, FERRITIN, TIBC, IRON, RETICCTPCT in the last 72 hours. Urine analysis:    Component Value Date/Time   COLORURINE YELLOW 02/13/2024 1517   APPEARANCEUR CLOUDY (A) 02/13/2024 1517   LABSPEC 1.012 02/13/2024 1517   LABSPEC 1.020 10/28/2014 1142   PHURINE 5.0 02/13/2024 1517   GLUCOSEU NEGATIVE 02/13/2024 1517   GLUCOSEU Negative 10/28/2014 1142   HGBUR SMALL (A) 02/13/2024 1517   BILIRUBINUR NEGATIVE 02/13/2024 1517   BILIRUBINUR Negative 10/28/2014 1142   KETONESUR NEGATIVE 02/13/2024 1517   PROTEINUR NEGATIVE 02/13/2024 1517   UROBILINOGEN 0.2 10/28/2014 1142   NITRITE POSITIVE (A) 02/13/2024 1517   LEUKOCYTESUR LARGE (A) 02/13/2024 1517   LEUKOCYTESUR Trace 10/28/2014 1142    Radiological Exams on Admission: CT CHEST ABDOMEN PELVIS W CONTRAST Result Date: 03/12/2024 CLINICAL DATA:  Sepsis, abdominal pain, metastatic breast cancer * Tracking Code: BO * EXAM: CT CHEST, ABDOMEN, AND PELVIS WITH CONTRAST TECHNIQUE: Multidetector CT imaging of the chest, abdomen and pelvis was performed following the standard protocol during bolus administration of intravenous contrast. RADIATION DOSE REDUCTION: This exam was performed according to the departmental dose-optimization program which includes automated exposure control, adjustment of the mA and/or kV  according to patient size and/or use of iterative reconstruction technique. CONTRAST:  75mL OMNIPAQUE  IOHEXOL  350 MG/ML SOLN COMPARISON:  MR left hip, 02/12/2024, CT chest abdomen pelvis, 11/14/2023 FINDINGS: CT CHEST FINDINGS Cardiovascular: Right chest port catheter. Normal heart size. No pericardial effusion. Mediastinum/Nodes: No enlarged mediastinal, hilar, or axillary lymph nodes. Diffuse circumferential wall thickening of the esophagus (series 3, image 31, series 7, image 75). Thyroid and trachea without significant findings. Lungs/Pleura: New trace right pleural effusion. Subpleural radiation fibrosis of the anterior right lung. Musculoskeletal: Status post bilateral mastectomy and bilateral axillary lymph node dissection. No acute osseous findings. Partially callused subacute fractures of the anterolateral right ribs (series 5, image 62). Partially callused, subacute to chronic nondisplaced fracture of the sternal body (series 7, image 73). CT ABDOMEN PELVIS FINDINGS Hepatobiliary: No solid liver abnormality. Focal fatty deposition adjacent to the falciform ligament, characteristic in appearance and location. Tiny cyst of the anterior liver (series 3, image 48). Status post cholecystectomy. Mild postoperative biliary ductal dilatation. Pancreas: Unremarkable. No pancreatic ductal dilatation or surrounding inflammatory changes. Spleen: Normal in size without significant abnormality. Adrenals/Urinary Tract: Adrenal glands are unremarkable. Simple, benign right renal cortical cysts for which no further follow-up or characterization is required. Kidneys are otherwise normal, without renal calculi, solid lesion, or hydronephrosis. Bladder is unremarkable. Stomach/Bowel: Stomach is within normal limits. Appendix not clearly visualized. No evidence of bowel wall thickening, distention, or inflammatory changes. Vascular/Lymphatic: No significant vascular findings are present. No enlarged abdominal or pelvic lymph  nodes. Reproductive: Calcified uterine fibroids. Other: No abdominal wall hernia or abnormality. No ascites. Musculoskeletal: Interval increase in bony resorption and sclerosis about the left sacroiliac joint (series 3, image 92). IMPRESSION: 1. Diffuse  circumferential wall thickening of the esophagus, consistent with nonspecific infectious or inflammatory esophagitis. 2. New trace right pleural effusion. 3. Interval increase in bony resorption and sclerosis about the left sacroiliac joint, concerning for ongoing septic arthritis. 4. Status post bilateral mastectomy and bilateral axillary lymph node dissection. No evidence of lymphadenopathy or metastatic disease in the chest, abdomen, or pelvis. Electronically Signed   By: Marolyn JONETTA Jaksch M.D.   On: 03/12/2024 21:31   DG Chest 2 View Result Date: 03/12/2024 EXAM: 2 VIEW(S) XRAY OF THE CHEST 03/12/2024 05:15:05 PM COMPARISON: None available. CLINICAL HISTORY: A 73 y.o. female was evaluated in triage. Pt complains of fatigue. Report having abd pain, n/v/d since yesterday. Hx of breast CA getting chemo. Also had L hip osteo on IV abx x 6 weeks. FINDINGS: LUNGS AND PLEURA: Bibasilar atelectasis or infiltrates. Round nodule or opacity projecting over the right hilum may be the pulmonary artery, seen en face; lymphadenopathy or pulmonary nodule are not excluded. Consider CT for further evaluation. No pleural effusion or pneumothorax. HEART AND MEDIASTINUM: No acute abnormality of the cardiac and mediastinal silhouettes. BONES AND SOFT TISSUES: No acute osseous abnormality. LINES AND TUBES: Right chest wall port-a-cath with tip in the right atrium. IMPRESSION: 1. Round nodule or opacity projecting over the right hilum. Differential includes pulmonary artery, lymphadenopathy, or pulmonary nodule. Consider CT for further evaluation. 2. Bibasilar atelectasis or infiltrates. Electronically signed by: Norman Gatlin MD 03/12/2024 05:23 PM EDT RP Workstation: [id number]     Assessment and Plan  Intractable nausea and vomiting Esophagitis AST 55 and remainder of LFTs normal.  Lipase normal.  EGD done 01/21/2024 was showing normal esophagus, congestive gastropathy, normal duodenal bulb, first portion of the duodenum and second portion of the duodenum.  CT done tonight showing findings consistent with nonspecific infectious or inflammatory esophagitis.  Given immunocompromised status, patient may need repeat EGD so I have sent a secure chat message to Dr. Federico with  GI requesting consultation the morning.  Continue gentle IV fluid hydration and monitor volume status closely given history of HFpEF.  Antiemetic as needed.  Start IV Protonix  40 mg every 12 hours.    Urinary frequency and urgency UA ordered to rule out UTI.  Diarrhea No evidence of colitis on CT.  C. difficile PCR ordered as patient is currently on IV antibiotics.  Enteric precautions.  Septic arthritis of the left sacroiliac joint Appears ongoing on CT.  WBC count 16.7, ESR 45, CRP 5.3.  Continue daptomycin  and ceftriaxone .  Consult infectious disease in the morning.  SIRS Meets SIRS criteria with tachycardia and leukocytosis.  Afebrile, lactate normal x 2, and no hypotension.  Tachycardia improving with IV fluids.  Trend WBC count and follow-up blood cultures.  Hypokalemia In the setting of vomiting and diarrhea.  Monitor potassium and magnesium  levels, continue to replace as needed.  Mild troponin elevation Likely due to demand ischemia.  EKG showing sinus tachycardia and no STEMI.  Patient is not endorsing chest pain.  Trend troponin.  Metastatic breast cancer Patient is currently on immunotherapy.  Outpatient oncology follow-up.  Chronic HFpEF Recent echo done 02/18/2024 showing EF 70 to 75%.  No pulmonary edema on imaging.  Check BNP.  History of prior PE Holding Eliquis  until patient is evaluated by GI.  Hypertension Currently normotensive.  Continue amlodipine  and  Coreg .  Hyperlipidemia Continue Crestor .  Mood disorder Continue home medications.  DVT prophylaxis: SCDs Code Status: Full Code (discussed with the patient) Family Communication: Husband and daughter at bedside.  Level of care: Telemetry bed Admission status: It is my clinical opinion that referral for OBSERVATION is reasonable and necessary in this patient based on the above information provided. The aforementioned taken together are felt to place the patient at high risk for further clinical deterioration. However, it is anticipated that the patient may be medically stable for discharge from the hospital within 24 to 48 hours.  Editha Ram MD [facility]  If 7PM-7AM, please contact night-coverage [email or web address]  03/13/2024, 2:23 AM

## 2024-03-13 NOTE — Progress Notes (Signed)
 PROGRESS NOTE  [name] FMW:[id number] DOB: 1950-11-15 DOA: 03/12/2024 PCP: Jefferey Fitch, MD   LOS: 0 days   Brief Narrative / Interim history: 73 y.o. female with medical history significant of metastatic breast cancer on trastuzumab , chronic HFpEF, history of PE on Eliquis , hypertension, hyperlipidemia, anxiety, class III obesity.  Recent hospital admission 6/19-6/26 for septic arthritis of sacroiliac joint, sacral osteomyelitis, left iliacus abscess with myositis, group B strep bacteremia, E. coli UTI.  Patient underwent CT-guided aspiration of left iliacus abscess.  Discharged home on daptomycin  and ceftriaxone  via port x 6 weeks (end date 8/4) per ID recommendation.  She now comes back to the hospital with nausea, vomiting, diarrhea and generalized abdominal pain for couple of days, unable to tolerate any p.o. intake.  She has been having chills.  In the ED she was found to have leukocytosis with white count of 16.7, she was tachycardic, and a CT scan of the abdomen and pelvis showed diffuse circumferential wall thickening of the esophagus, and he also showed ongoing septic arthritis and increased bony resorption and sclerosis in the left sacroiliac joint.  Subjective / 24h Interval events: She reports nausea this morning, no fever or chills.  Abdominal pain subsided some.  Has not had a bowel movement since being here.  Could not tolerate medications this morning with vomiting  Assesement and Plan: Principal Problem:   Intractable nausea and vomiting Active Problems:   Hypokalemia   Septic arthritis (HCC)   Esophagitis   Urinary frequency   Diarrhea   SIRS (systemic inflammatory response syndrome) (HCC)  Principal problem Intractable nausea, vomiting, concern for esophagitis -patient had an episode few months ago where she was admitted with hematemesis, EGD was done at that point which showed normal esophagus, congestive gastropathy so overall fairly unremarkable.  GI  reengaged, appreciate input.  Continue PPI  Active problems Septic arthritis of the left sacroiliac joint-appears that she is progressing some, ID reengaged.  Urinary frequency-UA pending, collected this morning but never sent.  Reordered and discussed with RN  Diarrhea-CT scan fairly unremarkable without any significant evidence of colitis.  Due to being on antibiotics, will check C. difficile.  Has not had any bowel movement since being admitted  Metastatic breast cancer-on immunotherapy, outpatient follow-up  History of prior PE-Eliquis  on hold this morning until GI sees her  Anemia of chronic disease-hemoglobin stable, no bleeding  Hypokalemia, hypomagnesemia-due to GI losses, poor p.o. intake, replenish as indicated and continue to monitor  Troponin elevation-flat, not in a pattern consistent with ACS, likely demand ischemia  Scheduled Meds:  amLODipine   10 mg Oral q morning   carvedilol   12.5 mg Oral BID WC   DULoxetine   60 mg Oral Daily   pantoprazole  (PROTONIX ) IV  40 mg Intravenous Q12H   rosuvastatin   10 mg Oral Daily   temazepam   30 mg Oral QHS   Continuous Infusions:  sodium chloride  100 mL/hr at 03/13/24 0440   cefTRIAXone  (ROCEPHIN )  IV     DAPTOmycin      potassium chloride  10 mEq (03/13/24 0951)   PRN Meds:.acetaminophen  **OR** acetaminophen , ALPRAZolam , ondansetron   Current Outpatient Medications  Medication Instructions   acetaminophen  (TYLENOL ) 500-1,000 mg, Every 6 hours PRN   albuterol  (VENTOLIN  HFA) 108 (90 Base) MCG/ACT inhaler 2 puffs, Inhalation, Every 6 hours PRN   ALPRAZolam  (XANAX ) 1 mg, Oral, 3 times daily PRN   amLODipine  (NORVASC ) 10 mg, Oral, Every morning   apixaban  (ELIQUIS ) 5 mg, Oral, 2 times daily   CALCIUM  PO  2 tablets, Daily   carvedilol  (COREG ) 12.5 mg, Oral, 2 times daily with meals   cefTRIAXone  (ROCEPHIN ) IVPB 2 g, Intravenous, Every 24 hours, Indication:  L-iliac fossa abscess First Dose: Yes Last Day of Therapy:  03/30/24 Labs -  Once weekly:  CBC/D and BMP, Labs - Once weekly: ESR and CRP Method of administration: IV Push Method of administration may be changed at the discretion of home infusion pharmacist based upon assessment of the patient and/or caregiver's ability to self-administer the medication ordered.   cyclobenzaprine  (FLEXERIL ) 5 mg, Oral, 2 times daily PRN   daptomycin  (CUBICIN ) IVPB 700 mg, Intravenous, Every 24 hours, Indication:  L-iliac fossa abscess First Dose: Yes Last Day of Therapy:  03/30/24 Labs - Once weekly:  CBC/D, BMP, and CPK Labs - Once weekly: ESR and CRP Method of administration: IV Push Method of administration may be changed at the discretion of home infusion pharmacist based upon assessment of the patient and/or caregiver's ability to self-administer the medication ordered.   DULoxetine  (CYMBALTA ) 60 mg, Daily   furosemide  (LASIX ) 40 mg, As needed   gabapentin  (NEURONTIN ) 300 MG capsule TAKE 2 CAPSULES BY MOUTH THREE TIMES DAILY   [Paused] losartan  (COZAAR ) 100 mg, Every morning   ondansetron  (ZOFRAN ) 8 mg, Oral, Every 8 hours PRN   Oxycodone  HCl 10 mg, Every 6 hours PRN   pantoprazole  (PROTONIX ) 40 mg, Oral, Daily   predniSONE (DELTASONE) 5 mg, Daily PRN   rosuvastatin  (CRESTOR ) 10 mg, Oral, Daily   [Paused] spironolactone  (ALDACTONE ) 25 MG tablet Take 1 tablet daily   temazepam  (RESTORIL ) 30 mg, Oral, Daily at bedtime    Diet Orders (From admission, onward)     Start     Ordered   03/13/24 0643  Diet NPO time specified Except for: Ice Chips, Sips with Meds  Diet effective now       Question Answer Comment  Except for Ice Chips   Except for Sips with Meds      03/13/24 0642            DVT prophylaxis: Place and maintain sequential compression device Start: 03/13/24 0355   Lab Results  Component Value Date   PLT 265 03/13/2024      Code Status: Full Code  Family Communication: no family at bedside   Status is: Observation The patient will require care  spanning > 2 midnights and should be moved to inpatient because: persistent symptoms   Level of care: Telemetry Medical  Consultants:  ID GI  Objective: Vitals:   03/13/24 0845 03/13/24 0944 03/13/24 0945 03/13/24 1000  BP: 116/72 126/76 105/75 127/80  Pulse: 96 100 97 (!) 105  Resp: 19  (!) 9 17  Temp:      TempSrc:      SpO2: 96%  100% 100%  Weight:      Height:        Intake/Output Summary (Last 24 hours) at 03/13/2024 1031 Last data filed at 03/13/2024 0837 Gross per 24 hour  Intake 343.42 ml  Output --  Net 343.42 ml   Wt Readings from Last 3 Encounters:  03/12/24 117.5 kg  02/13/24 102.1 kg  02/12/24 102.1 kg    Examination:  Constitutional: NAD Eyes: no scleral icterus ENMT: Mucous membranes are moist.  Neck: normal, supple Respiratory: clear to auscultation bilaterally, no wheezing, no crackles.  Cardiovascular: Regular rate and rhythm, no murmurs / rubs / gallops.  Abdomen: non distended, no tenderness. Bowel sounds positive.  Musculoskeletal: no clubbing /  cyanosis.    Data Reviewed: I have independently reviewed following labs and imaging studies   CBC Recent Labs  Lab 03/12/24 1653 03/13/24 0332  WBC 16.7* 13.0*  HGB 11.4* 10.0*  HCT 32.1* 28.0*  PLT 347 265  MCV 77.5* 77.3*  MCH 27.5 27.6  MCHC 35.5 35.7  RDW 16.6* 16.6*  LYMPHSABS 2.5  --   MONOABS 1.5*  --   EOSABS 0.6*  --   BASOSABS 0.1  --     Recent Labs  Lab 03/12/24 1653 03/12/24 1701 03/13/24 0108 03/13/24 0111 03/13/24 0332  NA 138  --   --   --  138  K 2.9*  --   --   --  2.9*  CL 100  --   --   --  103  CO2 22  --   --   --  22  GLUCOSE 120*  --   --   --  85  BUN 6*  --   --   --  5*  CREATININE 0.93  --   --   --  0.93  CALCIUM  8.5*  --   --   --  8.0*  AST 55*  --   --   --  43*  ALT 36  --   --   --  30  ALKPHOS 61  --   --   --  53  BILITOT 1.1  --   --   --  1.2  ALBUMIN 2.4*  --   --   --  2.1*  MG  --   --   --   --  1.4*  CRP  --   --  5.3*  --    --   LATICACIDVEN  --  1.4  --  1.4  --   BNP  --   --   --   --  53.7    ------------------------------------------------------------------------------------------------------------------ No results for input(s): CHOL, HDL, LDLCALC, TRIG, CHOLHDL, LDLDIRECT in the last 72 hours.  Lab Results  Component Value Date   HGBA1C 5.4 11/29/2023   ------------------------------------------------------------------------------------------------------------------ No results for input(s): TSH, T4TOTAL, T3FREE, THYROIDAB in the last 72 hours.  Invalid input(s): FREET3  Cardiac Enzymes No results for input(s): CKMB, TROPONINI, MYOGLOBIN in the last 168 hours.  Invalid input(s): CK ------------------------------------------------------------------------------------------------------------------    Component Value Date/Time   BNP 53.7 03/13/2024 0332    CBG: No results for input(s): GLUCAP in the last 168 hours.  Recent Results (from the past 240 hours)  Blood culture (routine x 2)     Status: None (Preliminary result)   Collection Time: 03/12/24  4:50 PM   Specimen: BLOOD  Result Value Ref Range Status   Specimen Description BLOOD RIGHT ANTECUBITAL  Final   Special Requests   Final    BOTTLES DRAWN AEROBIC AND ANAEROBIC Blood Culture results may not be optimal due to an inadequate volume of blood received in culture bottles   Culture   Final    NO GROWTH < 12 HOURS Performed at [facility], [address] [place], [place] [address]    Report Status PENDING  Incomplete  Blood culture (routine x 2)     Status: None (Preliminary result)   Collection Time: 03/12/24  4:55 PM   Specimen: BLOOD  Result Value Ref Range Status   Specimen Description BLOOD LEFT ANTECUBITAL  Final   Special Requests   Final    BOTTLES DRAWN AEROBIC AND ANAEROBIC Blood Culture results  may not be optimal due to an inadequate volume of blood received in culture bottles    Culture   Final    NO GROWTH < 12 HOURS Performed at [facility], [address] [place], [place] [address]    Report Status PENDING  Incomplete     Radiology Studies: CT CHEST ABDOMEN PELVIS W CONTRAST Result Date: 03/12/2024 CLINICAL DATA:  Sepsis, abdominal pain, metastatic breast cancer * Tracking Code: BO * EXAM: CT CHEST, ABDOMEN, AND PELVIS WITH CONTRAST TECHNIQUE: Multidetector CT imaging of the chest, abdomen and pelvis was performed following the standard protocol during bolus administration of intravenous contrast. RADIATION DOSE REDUCTION: This exam was performed according to the departmental dose-optimization program which includes automated exposure control, adjustment of the mA and/or kV according to patient size and/or use of iterative reconstruction technique. CONTRAST:  75mL OMNIPAQUE  IOHEXOL  350 MG/ML SOLN COMPARISON:  MR left hip, 02/12/2024, CT chest abdomen pelvis, 11/14/2023 FINDINGS: CT CHEST FINDINGS Cardiovascular: Right chest port catheter. Normal heart size. No pericardial effusion. Mediastinum/Nodes: No enlarged mediastinal, hilar, or axillary lymph nodes. Diffuse circumferential wall thickening of the esophagus (series 3, image 31, series 7, image 75). Thyroid and trachea without significant findings. Lungs/Pleura: New trace right pleural effusion. Subpleural radiation fibrosis of the anterior right lung. Musculoskeletal: Status post bilateral mastectomy and bilateral axillary lymph node dissection. No acute osseous findings. Partially callused subacute fractures of the anterolateral right ribs (series 5, image 62). Partially callused, subacute to chronic nondisplaced fracture of the sternal body (series 7, image 73). CT ABDOMEN PELVIS FINDINGS Hepatobiliary: No solid liver abnormality. Focal fatty deposition adjacent to the falciform ligament, characteristic in appearance and location. Tiny cyst of the anterior liver (series 3, image 48). Status post cholecystectomy.  Mild postoperative biliary ductal dilatation. Pancreas: Unremarkable. No pancreatic ductal dilatation or surrounding inflammatory changes. Spleen: Normal in size without significant abnormality. Adrenals/Urinary Tract: Adrenal glands are unremarkable. Simple, benign right renal cortical cysts for which no further follow-up or characterization is required. Kidneys are otherwise normal, without renal calculi, solid lesion, or hydronephrosis. Bladder is unremarkable. Stomach/Bowel: Stomach is within normal limits. Appendix not clearly visualized. No evidence of bowel wall thickening, distention, or inflammatory changes. Vascular/Lymphatic: No significant vascular findings are present. No enlarged abdominal or pelvic lymph nodes. Reproductive: Calcified uterine fibroids. Other: No abdominal wall hernia or abnormality. No ascites. Musculoskeletal: Interval increase in bony resorption and sclerosis about the left sacroiliac joint (series 3, image 92). IMPRESSION: 1. Diffuse circumferential wall thickening of the esophagus, consistent with nonspecific infectious or inflammatory esophagitis. 2. New trace right pleural effusion. 3. Interval increase in bony resorption and sclerosis about the left sacroiliac joint, concerning for ongoing septic arthritis. 4. Status post bilateral mastectomy and bilateral axillary lymph node dissection. No evidence of lymphadenopathy or metastatic disease in the chest, abdomen, or pelvis. Electronically Signed   By: Marolyn JONETTA Jaksch M.D.   On: 03/12/2024 21:31   DG Chest 2 View Result Date: 03/12/2024 EXAM: 2 VIEW(S) XRAY OF THE CHEST 03/12/2024 05:15:05 PM COMPARISON: None available. CLINICAL HISTORY: A 73 y.o. female was evaluated in triage. Pt complains of fatigue. Report having abd pain, n/v/d since yesterday. Hx of breast CA getting chemo. Also had L hip osteo on IV abx x 6 weeks. FINDINGS: LUNGS AND PLEURA: Bibasilar atelectasis or infiltrates. Round nodule or opacity projecting over the  right hilum may be the pulmonary artery, seen en face; lymphadenopathy or pulmonary nodule are not excluded. Consider CT for further evaluation. No pleural  effusion or pneumothorax. HEART AND MEDIASTINUM: No acute abnormality of the cardiac and mediastinal silhouettes. BONES AND SOFT TISSUES: No acute osseous abnormality. LINES AND TUBES: Right chest wall port-a-cath with tip in the right atrium. IMPRESSION: 1. Round nodule or opacity projecting over the right hilum. Differential includes pulmonary artery, lymphadenopathy, or pulmonary nodule. Consider CT for further evaluation. 2. Bibasilar atelectasis or infiltrates. Electronically signed by: Norman Gatlin MD 03/12/2024 05:23 PM EDT RP Workstation: [id number]     Nilda Fendt, MD, PhD [facility]  Between 7 am - 7 pm I am available, please contact me via Amion (for emergencies) or Securechat (non urgent messages)  Between 7 pm - 7 am I am not available, please contact night coverage MD/APP via Amion

## 2024-03-13 NOTE — ED Notes (Signed)
 Pt ambulated to restroom with rollator attempting to provide a urine and fecal sample.

## 2024-03-13 NOTE — Consult Note (Signed)
 [facility]    Date of Admission:  03/12/2024   Total days of inpatient antibiotics 1        Reason for Consult: N/V    Principal Problem:   Intractable nausea and vomiting Active Problems:   Hypokalemia   Septic arthritis (HCC)   Esophagitis   Urinary frequency   Diarrhea   SIRS (systemic inflammatory response syndrome) (HCC)   Assessment: 73 year old female with septic joint arthritis and osteomyelitis status post aspiration discharged on Dapto and ceftriaxone  x 6 weeks through 8/4 presented with nausea and vomiting #Nausea/vomiting/diarrhea #Esophageal thickening #Cytosis -CT chest abdomen pelvis with contrast showed diffuse, Refresh Relieva wall thickening of the esophagus consistent with infectious inflammatory esophagitis, interval increase in bone resorption and sclerosis of the left sacroiliac joint concerning for septic arthritis.  Recommendations:  -Follow-up blood cultures - Follow-up GI recs - Follow-up  GIP and C. difficile testing - Continue daptomycin , ceftriaxone , regimen - Communicated with primary - Standard precautions - Dr. Overton is covering this weekend.  Be back on service on Monday  Evaluation of this patient requires complex antimicrobial therapy evaluation and counseling + isolation needs for disease transmission risk assessment and mitigation   Microbiology:   Antibiotics: [place] ceftriaxone   Cultures: Blood 7/17 no growth Urine  Other   HPI: [name] is a 73 y.o. female with past medical history of SI septic arthritis and osteomyelitis with associated fluid collection left iliac us  status post aspiration with negative cultures discharged on daptomycin  ceftriaxone  in 6 weeks through 8/4, breast cancer on Herceptin  every 28 days as maintenance, SVC syndrome, CHF, hypertension, PE on AC, anxiety presents with nausea and vomiting along with diarrhea for the past few days.  On arrival she had leukocytosis  16K.  Afebrile.  CT chest abdomen pelvis showed circumferential thickening of esophagus, and interval increase in bone resorption and sclerosis of septic arthritis.  ID engaged.   Review of Systems: Review of Systems  All other systems reviewed and are negative.   Past Medical History:  Diagnosis Date   Anemia    pt pt report   Anxiety    Breast cancer (HCC)    mets to liver and lung   Breast cancer metastasized to multiple sites [facility]) 02/26/2013   Cellulitis    CHF (congestive heart failure) (HCC)    History of chemotherapy 09/2004   taxotere/herceptin /carboplatin   Hypertension    Neuropathy    Radiation 07/31/2006   left upper chest   Radiation 06/17/2006-06/27/2006   6480 cGy bilat. chest wall   SVC syndrome    Thrombosis     Social History   Tobacco Use   Smoking status: Never    Passive exposure: Never   Smokeless tobacco: Never  Vaping Use   Vaping status: Never Used  Substance Use Topics   Alcohol  use: Yes    Comment: occasional- once a month   Drug use: No    Family History  Problem Relation Age of Onset   Heart failure Father    Cancer Father        Prostate cancer   Heart failure Brother    Cancer Brother        Prostate cancer   Diabetes Maternal Aunt    Scheduled Meds:  amLODipine   10 mg Oral q morning   carvedilol   12.5 mg Oral BID WC   DULoxetine   60 mg Oral Daily   pantoprazole  (PROTONIX ) IV  40 mg Intravenous Q12H   rosuvastatin   10 mg Oral Daily   temazepam   30 mg Oral QHS   Continuous Infusions:  sodium chloride  100 mL/hr at 03/13/24 0440   cefTRIAXone  (ROCEPHIN )  IV Stopped (03/13/24 1148)   DAPTOmycin      PRN Meds:.acetaminophen  **OR** acetaminophen , ALPRAZolam , ondansetron  Allergies  Allergen Reactions   Adhesive [Tape] Other (See Comments)    Tears the skin    Penicillins Hives and Other (See Comments)    PATIENT HAS TOLERATED Cephalosporins    OBJECTIVE: Blood pressure 101/67, pulse 80, temperature 98.2 F (36.8 C),  temperature source Oral, resp. rate 14, height 5' 3 (1.6 m), weight 117.5 kg, SpO2 97%.  Physical Exam Constitutional:      Appearance: Normal appearance.  HENT:     Head: Normocephalic and atraumatic.     Right Ear: Tympanic membrane normal.     Left Ear: Tympanic membrane normal.     Nose: Nose normal.     Mouth/Throat:     Mouth: Mucous membranes are moist.  Eyes:     Extraocular Movements: Extraocular movements intact.     Conjunctiva/sclera: Conjunctivae normal.     Pupils: Pupils are equal, round, and reactive to light.  Cardiovascular:     Rate and Rhythm: Normal rate and regular rhythm.     Heart sounds: No murmur heard.    No friction rub. No gallop.  Pulmonary:     Effort: Pulmonary effort is normal.     Breath sounds: Normal breath sounds.  Abdominal:     General: Abdomen is flat.     Palpations: Abdomen is soft.  Musculoskeletal:        General: Normal range of motion.  Skin:    General: Skin is warm and dry.  Neurological:     General: No focal deficit present.     Mental Status: She is alert and oriented to person, place, and time.  Psychiatric:        Mood and Affect: Mood normal.     Lab Results Lab Results  Component Value Date   WBC 13.0 (H) 03/13/2024   HGB 10.0 (L) 03/13/2024   HCT 28.0 (L) 03/13/2024   MCV 77.3 (L) 03/13/2024   PLT 265 03/13/2024    Lab Results  Component Value Date   CREATININE 0.93 03/13/2024   BUN 5 (L) 03/13/2024   NA 138 03/13/2024   K 2.9 (L) 03/13/2024   CL 103 03/13/2024   CO2 22 03/13/2024    Lab Results  Component Value Date   ALT 30 03/13/2024   AST 43 (H) 03/13/2024   ALKPHOS 53 03/13/2024   BILITOT 1.2 03/13/2024       Loney Stank, MD [facility] 03/13/2024, 12:19 PM

## 2024-03-14 DIAGNOSIS — R112 Nausea with vomiting, unspecified: Secondary | ICD-10-CM | POA: Diagnosis not present

## 2024-03-14 LAB — URINALYSIS, W/ REFLEX TO CULTURE (INFECTION SUSPECTED)
Glucose, UA: NEGATIVE mg/dL
Ketones, ur: 20 mg/dL — AB
Leukocytes,Ua: NEGATIVE
Nitrite: NEGATIVE
Protein, ur: 100 mg/dL — AB
Specific Gravity, Urine: 1.038 — ABNORMAL HIGH (ref 1.005–1.030)
pH: 6 (ref 5.0–8.0)

## 2024-03-14 LAB — GASTROINTESTINAL PANEL BY PCR, STOOL (REPLACES STOOL CULTURE)

## 2024-03-14 LAB — CBC
HCT: 25.8 % — ABNORMAL LOW (ref 36.0–46.0)
Hemoglobin: 9.1 g/dL — ABNORMAL LOW (ref 12.0–15.0)
MCH: 27.6 pg (ref 26.0–34.0)
MCHC: 35.3 g/dL (ref 30.0–36.0)
MCV: 78.2 fL — ABNORMAL LOW (ref 80.0–100.0)
Platelets: 263 K/uL (ref 150–400)
RBC: 3.3 MIL/uL — ABNORMAL LOW (ref 3.87–5.11)
RDW: 16.7 % — ABNORMAL HIGH (ref 11.5–15.5)
WBC: 10 K/uL (ref 4.0–10.5)
nRBC: 0 % (ref 0.0–0.2)

## 2024-03-14 LAB — COMPREHENSIVE METABOLIC PANEL WITH GFR
ALT: 30 U/L (ref 0–44)
AST: 51 U/L — ABNORMAL HIGH (ref 15–41)
Albumin: 1.9 g/dL — ABNORMAL LOW (ref 3.5–5.0)
Alkaline Phosphatase: 51 U/L (ref 38–126)
Anion gap: 10 (ref 5–15)
BUN: 5 mg/dL — ABNORMAL LOW (ref 8–23)
CO2: 21 mmol/L — ABNORMAL LOW (ref 22–32)
Calcium: 7.9 mg/dL — ABNORMAL LOW (ref 8.9–10.3)
Chloride: 105 mmol/L (ref 98–111)
Creatinine, Ser: 0.98 mg/dL (ref 0.44–1.00)
GFR, Estimated: >60 mL/min (ref >60)
Glucose, Bld: 71 mg/dL (ref 70–99)
Potassium: 3.3 mmol/L — ABNORMAL LOW (ref 3.5–5.1)
Sodium: 136 mmol/L (ref 135–145)
Total Bilirubin: 1.2 mg/dL (ref 0.0–1.2)
Total Protein: 5.3 g/dL — ABNORMAL LOW (ref 6.5–8.1)

## 2024-03-14 LAB — MAGNESIUM: Magnesium: 1.7 mg/dL (ref 1.7–2.4)

## 2024-03-14 LAB — PHOSPHORUS: Phosphorus: 1.7 mg/dL — ABNORMAL LOW (ref 2.5–4.6)

## 2024-03-14 MED ORDER — POTASSIUM PHOSPHATES 15 MMOLE/5ML IV SOLN
30.0000 mmol | Freq: Once | INTRAVENOUS | Status: AC
  Administered 2024-03-14: 30 mmol via INTRAVENOUS
  Filled 2024-03-14: qty 10

## 2024-03-14 MED ORDER — APIXABAN 5 MG PO TABS
5.0000 mg | ORAL_TABLET | Freq: Two times a day (BID) | ORAL | Status: DC
  Administered 2024-03-14 – 2024-03-17 (×7): 5 mg via ORAL
  Filled 2024-03-14 (×7): qty 1

## 2024-03-14 MED ORDER — MAGNESIUM SULFATE 2 GM/50ML IV SOLN
2.0000 g | Freq: Once | INTRAVENOUS | Status: AC
  Administered 2024-03-14: 2 g via INTRAVENOUS
  Filled 2024-03-14: qty 50

## 2024-03-14 MED ORDER — ENSURE PLUS HIGH PROTEIN PO LIQD
237.0000 mL | Freq: Two times a day (BID) | ORAL | Status: DC
  Administered 2024-03-15: 237 mL via ORAL

## 2024-03-14 NOTE — Plan of Care (Signed)

## 2024-03-14 NOTE — Progress Notes (Signed)
 Patient ID: [name], female   DOB: [birth year], 73 y.o.   MRN: [id number]    Progress Note   Subjective   Day # 2 CC; nausea vomiting abdominal pain and diarrhea in patient with metastatic breast cancer, on immunotherapy and currently on IV antibiotics for joint infection.  GI path panel active C. difficile quick screen negative WBC 10.0 improved Hemoglobin 9.1/hematocrit 25.8 Potassium 3.3/BUN 5/creatinine 0.98 LFTs normal with exception AST 51  Patient says overall she is feeling much better than she was on admission, she still has some mild nausea but has not had vomiting, her abdominal discomfort has improved and she has had 2 diarrheal bowel movements She is taking full liquids, does not feel ready to advance her diet       Objective   Vital signs in last 24 hours: Temp:  [98 F (36.7 C)-99.2 F (37.3 C)] 99.2 F (37.3 C) (07/19 0823) Pulse Rate:  [69-105] 81 (07/19 0823) Resp:  [9-18] 18 (07/19 0425) BP: (101-143)/(54-80) 114/64 (07/19 0823) SpO2:  [95 %-100 %] 99 % (07/19 0823)   General:    Elderly African-American female in NAD Heart:  Regular rate and rhythm; no murmurs Lungs: Respirations even and unlabored, lungs CTA bilaterally Abdomen:  Soft, mild rather generalized tenderness, no guarding or rebound, no palpable mass or hepatosplenomegaly. Normal bowel sounds. Extremities:  Without edema. Neurologic:  Alert and oriented,  grossly normal neurologically. Psych:  Cooperative. Normal mood and affect.  Intake/Output from previous day: 07/18 0701 - 07/19 0700 In: 146.8 [IV Piggyback:146.8] Out: -  Intake/Output this shift: No intake/output data recorded.  Lab Results: Recent Labs    03/12/24 1653 03/13/24 0332 03/14/24 0237  WBC 16.7* 13.0* 10.0  HGB 11.4* 10.0* 9.1*  HCT 32.1* 28.0* 25.8*  PLT 347 265 263   BMET Recent Labs    03/12/24 1653 03/13/24 0332 03/14/24 0237  NA 138 138 136  K 2.9* 2.9* 3.3*  CL 100 103 105  CO2 22 22 21*   GLUCOSE 120* 85 71  BUN 6* 5* 5*  CREATININE 0.93 0.93 0.98  CALCIUM  8.5* 8.0* 7.9*   LFT Recent Labs    03/14/24 0237  PROT 5.3*  ALBUMIN 1.9*  AST 51*  ALT 30  ALKPHOS 51  BILITOT 1.2   PT/INR No results for input(s): LABPROT, INR in the last 72 hours.  Studies/Results: CT CHEST ABDOMEN PELVIS W CONTRAST Result Date: 03/12/2024 CLINICAL DATA:  Sepsis, abdominal pain, metastatic breast cancer * Tracking Code: BO * EXAM: CT CHEST, ABDOMEN, AND PELVIS WITH CONTRAST TECHNIQUE: Multidetector CT imaging of the chest, abdomen and pelvis was performed following the standard protocol during bolus administration of intravenous contrast. RADIATION DOSE REDUCTION: This exam was performed according to the departmental dose-optimization program which includes automated exposure control, adjustment of the mA and/or kV according to patient size and/or use of iterative reconstruction technique. CONTRAST:  75mL OMNIPAQUE  IOHEXOL  350 MG/ML SOLN COMPARISON:  MR left hip, 02/12/2024, CT chest abdomen pelvis, 11/14/2023 FINDINGS: CT CHEST FINDINGS Cardiovascular: Right chest port catheter. Normal heart size. No pericardial effusion. Mediastinum/Nodes: No enlarged mediastinal, hilar, or axillary lymph nodes. Diffuse circumferential wall thickening of the esophagus (series 3, image 31, series 7, image 75). Thyroid and trachea without significant findings. Lungs/Pleura: New trace right pleural effusion. Subpleural radiation fibrosis of the anterior right lung. Musculoskeletal: Status post bilateral mastectomy and bilateral axillary lymph node dissection. No acute osseous findings. Partially callused subacute fractures of the anterolateral right ribs (series 5,  image 62). Partially callused, subacute to chronic nondisplaced fracture of the sternal body (series 7, image 73). CT ABDOMEN PELVIS FINDINGS Hepatobiliary: No solid liver abnormality. Focal fatty deposition adjacent to the falciform ligament,  characteristic in appearance and location. Tiny cyst of the anterior liver (series 3, image 48). Status post cholecystectomy. Mild postoperative biliary ductal dilatation. Pancreas: Unremarkable. No pancreatic ductal dilatation or surrounding inflammatory changes. Spleen: Normal in size without significant abnormality. Adrenals/Urinary Tract: Adrenal glands are unremarkable. Simple, benign right renal cortical cysts for which no further follow-up or characterization is required. Kidneys are otherwise normal, without renal calculi, solid lesion, or hydronephrosis. Bladder is unremarkable. Stomach/Bowel: Stomach is within normal limits. Appendix not clearly visualized. No evidence of bowel wall thickening, distention, or inflammatory changes. Vascular/Lymphatic: No significant vascular findings are present. No enlarged abdominal or pelvic lymph nodes. Reproductive: Calcified uterine fibroids. Other: No abdominal wall hernia or abnormality. No ascites. Musculoskeletal: Interval increase in bony resorption and sclerosis about the left sacroiliac joint (series 3, image 92). IMPRESSION: 1. Diffuse circumferential wall thickening of the esophagus, consistent with nonspecific infectious or inflammatory esophagitis. 2. New trace right pleural effusion. 3. Interval increase in bony resorption and sclerosis about the left sacroiliac joint, concerning for ongoing septic arthritis. 4. Status post bilateral mastectomy and bilateral axillary lymph node dissection. No evidence of lymphadenopathy or metastatic disease in the chest, abdomen, or pelvis. Electronically Signed   By: Marolyn JONETTA Jaksch M.D.   On: 03/12/2024 21:31   DG Chest 2 View Result Date: 03/12/2024 EXAM: 2 VIEW(S) XRAY OF THE CHEST 03/12/2024 05:15:05 PM COMPARISON: None available. CLINICAL HISTORY: A 73 y.o. female was evaluated in triage. Pt complains of fatigue. Report having abd pain, n/v/d since yesterday. Hx of breast CA getting chemo. Also had L hip osteo on  IV abx x 6 weeks. FINDINGS: LUNGS AND PLEURA: Bibasilar atelectasis or infiltrates. Round nodule or opacity projecting over the right hilum may be the pulmonary artery, seen en face; lymphadenopathy or pulmonary nodule are not excluded. Consider CT for further evaluation. No pleural effusion or pneumothorax. HEART AND MEDIASTINUM: No acute abnormality of the cardiac and mediastinal silhouettes. BONES AND SOFT TISSUES: No acute osseous abnormality. LINES AND TUBES: Right chest wall port-a-cath with tip in the right atrium. IMPRESSION: 1. Round nodule or opacity projecting over the right hilum. Differential includes pulmonary artery, lymphadenopathy, or pulmonary nodule. Consider CT for further evaluation. 2. Bibasilar atelectasis or infiltrates. Electronically signed by: Norman Gatlin MD 03/12/2024 05:23 PM EDT RP Workstation: [id number]       Assessment / Plan:    #79 72 year old female with metastatic breast cancer to liver and lungs currently on trastuzumab  to 28 days, and with recent hospitalization for septic arthritis left SI joint with associated osteomyelitis.  She required I&D and had a group B strep bacteremia for which she is now on IV antibiotics at home with daptomycin  and Rocephin  x 6 weeks.  Presents now with acute onset of nausea vomiting and diarrhea x 2 days prior to admission, with inability to keep down p.o.'s.  Infectious workup has been negative with negative C. difficile quick screen and negative GI path panel  CT just showed diffuse thickening of the esophagus and trace right pleural effusion, no bowel wall thickening.  Patient just had EGD in May 2025 when she was hospitalized with nausea and vomiting and esophagus was normal at that time and found to have gastritis.   Most likely this was still an infectious gastroenteritis, patient mentions that she  has 14 grandchildren that are always in and out of her house.  Fortunately she is already feeling much better.   Plan;  continue supportive management, continue full liquid diet until she is ready to advance to soft bland diet Antiemetics as needed Hydration Twice daily PPI, and then discharged home on Protonix  40 mg once daily She continues to improve she may be able to be discharged tomorrow GI will be available if needed    Principal Problem:   Intractable nausea and vomiting Active Problems:   Hypokalemia   Septic arthritis (HCC)   Esophagitis   Urinary frequency   Diarrhea   SIRS (systemic inflammatory response syndrome) (HCC)     LOS: 1 day   Labradford Schnitker EsterwoodPA-C  03/14/2024, 9:34 AM

## 2024-03-14 NOTE — Progress Notes (Signed)
 PHARMACY - ANTICOAGULATION CONSULT NOTE  Pharmacy Consult for PTA Eliquis   Indication: atrial fibrillation  Allergies  Allergen Reactions   Adhesive [Tape] Other (See Comments)    Tears the skin    Penicillins Hives and Other (See Comments)    PATIENT HAS TOLERATED Cephalosporins    Patient Measurements: Height: 5' 3 (160 cm) Weight: 117.5 kg (259 lb) IBW/kg (Calculated) : 52.4 HEPARIN  DW (KG): 81.1  Vital Signs: Temp: 99.2 F (37.3 C) (07/19 0823) Temp Source: Oral (07/19 0823) BP: 114/64 (07/19 0823) Pulse Rate: 81 (07/19 0823)  Labs: Recent Labs    03/12/24 1653 03/13/24 0108 03/13/24 0332 03/14/24 0237  HGB 11.4*  --  10.0* 9.1*  HCT 32.1*  --  28.0* 25.8*  PLT 347  --  265 263  CREATININE 0.93  --  0.93 0.98  TROPONINIHS  --  27* 27*  --     Estimated Creatinine Clearance: 64.2 mL/min (by C-G formula based on SCr of 0.98 mg/dL).   Medical History: Past Medical History:  Diagnosis Date   Anemia    pt pt report   Anxiety    Breast cancer (HCC)    mets to liver and lung   Breast cancer metastasized to multiple sites [facility]) 02/26/2013   Cellulitis    CHF (congestive heart failure) (HCC)    History of chemotherapy 09/2004   taxotere/herceptin /carboplatin   Hypertension    Neuropathy    Radiation 07/31/2006   left upper chest   Radiation 06/17/2006-06/27/2006   6480 cGy bilat. chest wall   SVC syndrome    Thrombosis     Medications: see MAR  Assessment: 73 yo with hx PE on Eliquis  PTA.  DOAC initially on hold for procedures, now not planning endoscopy - Pharmacy consulted to restart Eliquis .  Last dose PTA - 7/17 AM.  Plan:  START Eliquis  5 mg BID, starting this morning  Maurilio Fila, PharmD Clinical Pharmacist 03/14/2024  9:55 AM

## 2024-03-14 NOTE — Plan of Care (Signed)

## 2024-03-14 NOTE — Progress Notes (Signed)
 PROGRESS NOTE  [name] FMW:[id number] DOB: [birth year] DOA: 03/12/2024 PCP: Jefferey Fitch, MD   LOS: 1 day   Brief Narrative / Interim history: 73 y.o. female with medical history significant of metastatic breast cancer on trastuzumab , chronic HFpEF, history of PE on Eliquis , hypertension, hyperlipidemia, anxiety, class III obesity.  Recent hospital admission 6/19-6/26 for septic arthritis of sacroiliac joint, sacral osteomyelitis, left iliacus abscess with myositis, group B strep bacteremia, E. coli UTI.  Patient underwent CT-guided aspiration of left iliacus abscess.  Discharged home on daptomycin  and ceftriaxone  via port x 6 weeks (end date 8/4) per ID recommendation.  She now comes back to the hospital with nausea, vomiting, diarrhea and generalized abdominal pain for couple of days, unable to tolerate any p.o. intake.  She has been having chills.  In the ED she was found to have leukocytosis with white count of 16.7, she was tachycardic, and a CT scan of the abdomen and pelvis showed diffuse circumferential wall thickening of the esophagus, and he also showed ongoing septic arthritis and increased bony resorption and sclerosis in the left sacroiliac joint.  Subjective / 24h Interval events: She has been having ongoing nausea and vomiting, however not as continuous as at home.  She actually responded to the antiemetics and slept better.  Wants to try a little bit more consistency in her diet this morning  Assesement and Plan: Principal Problem:   Intractable nausea and vomiting Active Problems:   Hypokalemia   Septic arthritis (HCC)   Esophagitis   Urinary frequency   Diarrhea   SIRS (systemic inflammatory response syndrome) (HCC)  Principal problem Intractable nausea, vomiting, concern for esophagitis -patient had an episode few months ago where she was admitted with hematemesis, EGD was done at that point which showed normal esophagus, congestive gastropathy so overall  fairly unremarkable.  GI reengaged, appreciate input.  Continue PPI.  For now GI recommends conservative management, EGD if she is not improving - Remains somewhat symptomatic with slight improvement today.  Will see how she does with full liquids  Active problems Septic arthritis of the left sacroiliac joint-appears that she is progressing some, ID consulted, no other new recommendations at this point other than continue her home medications - Blood cultures are pending, no growth to date  Urinary frequency-UA pending, urinalysis never sent since admission despite being ordered twice.  Reason is unclear.  It would be of low yield right now but discussed with the bedside RN to send anyway  Diarrhea-CT scan fairly unremarkable without any significant evidence of colitis.  C. difficile and GI pathogen panel both negative  Metastatic breast cancer-on immunotherapy, outpatient follow-up  History of prior PE-resume Eliquis  since there are no current plans for endoscopy  Anemia of chronic disease-hemoglobin stable, no bleeding  Hypokalemia, hypomagnesemia, hypophosphatemia-due to GI losses, poor p.o. intake, replenish and continue to monitor  Troponin elevation-flat, not in a pattern consistent with ACS, likely demand ischemia.  No chest pain, continue statin  Scheduled Meds:  amLODipine   10 mg Oral q morning   carvedilol   12.5 mg Oral BID WC   DULoxetine   60 mg Oral Daily   pantoprazole  (PROTONIX ) IV  40 mg Intravenous Q12H   rosuvastatin   10 mg Oral Daily   temazepam   30 mg Oral QHS   Continuous Infusions:  cefTRIAXone  (ROCEPHIN )  IV Stopped (03/13/24 1148)   DAPTOmycin  Stopped (03/13/24 1737)   magnesium  sulfate bolus IVPB 2 g (03/14/24 0838)   potassium PHOSPHATE  IVPB (in mmol)  PRN Meds:.acetaminophen  **OR** acetaminophen , ALPRAZolam , ondansetron   Current Outpatient Medications  Medication Instructions   acetaminophen  (TYLENOL ) 500-1,000 mg, Every 6 hours PRN   albuterol   (VENTOLIN  HFA) 108 (90 Base) MCG/ACT inhaler 2 puffs, Inhalation, Every 6 hours PRN   ALPRAZolam  (XANAX ) 1 mg, Oral, 3 times daily PRN   amLODipine  (NORVASC ) 10 mg, Oral, Every morning   apixaban  (ELIQUIS ) 5 mg, Oral, 2 times daily   CALCIUM  PO 2 tablets, Daily   carvedilol  (COREG ) 12.5 mg, Oral, 2 times daily with meals   cefTRIAXone  (ROCEPHIN ) IVPB 2 g, Intravenous, Every 24 hours, Indication:  L-iliac fossa abscess First Dose: Yes Last Day of Therapy:  03/30/24 Labs - Once weekly:  CBC/D and BMP, Labs - Once weekly: ESR and CRP Method of administration: IV Push Method of administration may be changed at the discretion of home infusion pharmacist based upon assessment of the patient and/or caregiver's ability to self-administer the medication ordered.   cyclobenzaprine  (FLEXERIL ) 5 mg, Oral, 2 times daily PRN   daptomycin  (CUBICIN ) IVPB 700 mg, Intravenous, Every 24 hours, Indication:  L-iliac fossa abscess First Dose: Yes Last Day of Therapy:  03/30/24 Labs - Once weekly:  CBC/D, BMP, and CPK Labs - Once weekly: ESR and CRP Method of administration: IV Push Method of administration may be changed at the discretion of home infusion pharmacist based upon assessment of the patient and/or caregiver's ability to self-administer the medication ordered.   DULoxetine  (CYMBALTA ) 60 mg, Daily   furosemide  (LASIX ) 40 mg, As needed   gabapentin  (NEURONTIN ) 300 MG capsule TAKE 2 CAPSULES BY MOUTH THREE TIMES DAILY   [Paused] losartan  (COZAAR ) 100 mg, Every morning   ondansetron  (ZOFRAN ) 8 mg, Oral, Every 8 hours PRN   Oxycodone  HCl 10 mg, Every 6 hours PRN   pantoprazole  (PROTONIX ) 40 mg, Oral, Daily   predniSONE (DELTASONE) 5 mg, Daily PRN   rosuvastatin  (CRESTOR ) 10 mg, Oral, Daily   [Paused] spironolactone  (ALDACTONE ) 25 MG tablet Take 1 tablet daily   temazepam  (RESTORIL ) 30 mg, Oral, Daily at bedtime    Diet Orders (From admission, onward)     Start     Ordered   03/14/24 0929  Diet full  liquid Room service appropriate? Yes; Fluid consistency: Thin  Diet effective now       Question Answer Comment  Room service appropriate? Yes   Fluid consistency: Thin      03/14/24 0928            DVT prophylaxis: Place and maintain sequential compression device Start: 03/13/24 0355   Lab Results  Component Value Date   PLT 263 03/14/2024      Code Status: Full Code  Family Communication: no family at bedside   Status is: Inpatient   Level of care: Telemetry Medical  Consultants:  ID GI  Objective: Vitals:   03/13/24 2015 03/14/24 0013 03/14/24 0425 03/14/24 0823  BP: (!) 112/56 110/67 (!) 143/73 114/64  Pulse: 69 74 80 81  Resp: 18 17 18    Temp: 98.7 F (37.1 C) 98 F (36.7 C) 99 F (37.2 C) 99.2 F (37.3 C)  TempSrc: Oral Oral Oral Oral  SpO2: 98% 95% 97% 99%  Weight:      Height:        Intake/Output Summary (Last 24 hours) at 03/14/2024 0928 Last data filed at 03/13/2024 1148 Gross per 24 hour  Intake 100.37 ml  Output --  Net 100.37 ml   Wt Readings from Last 3 Encounters:  03/12/24 117.5 kg  02/13/24 102.1 kg  02/12/24 102.1 kg    Examination:  Constitutional: NAD Eyes: lids and conjunctivae normal, no scleral icterus ENMT: mmm Neck: normal, supple Respiratory: clear to auscultation bilaterally, no wheezing, no crackles. Normal respiratory effort.  Cardiovascular: Regular rate and rhythm, no murmurs / rubs / gallops. No LE edema. Abdomen: soft, no distention, no tenderness. Bowel sounds positive.   Data Reviewed: I have independently reviewed following labs and imaging studies   CBC Recent Labs  Lab 03/12/24 1653 03/13/24 0332 03/14/24 0237  WBC 16.7* 13.0* 10.0  HGB 11.4* 10.0* 9.1*  HCT 32.1* 28.0* 25.8*  PLT 347 265 263  MCV 77.5* 77.3* 78.2*  MCH 27.5 27.6 27.6  MCHC 35.5 35.7 35.3  RDW 16.6* 16.6* 16.7*  LYMPHSABS 2.5  --   --   MONOABS 1.5*  --   --   EOSABS 0.6*  --   --   BASOSABS 0.1  --   --     Recent  Labs  Lab 03/12/24 1653 03/12/24 1701 03/13/24 0108 03/13/24 0111 03/13/24 0332 03/14/24 0237  NA 138  --   --   --  138 136  K 2.9*  --   --   --  2.9* 3.3*  CL 100  --   --   --  103 105  CO2 22  --   --   --  22 21*  GLUCOSE 120*  --   --   --  85 71  BUN 6*  --   --   --  5* 5*  CREATININE 0.93  --   --   --  0.93 0.98  CALCIUM  8.5*  --   --   --  8.0* 7.9*  AST 55*  --   --   --  43* 51*  ALT 36  --   --   --  30 30  ALKPHOS 61  --   --   --  53 51  BILITOT 1.1  --   --   --  1.2 1.2  ALBUMIN 2.4*  --   --   --  2.1* 1.9*  MG  --   --   --   --  1.4* 1.7  CRP  --   --  5.3*  --   --   --   LATICACIDVEN  --  1.4  --  1.4  --   --   BNP  --   --   --   --  53.7  --     ------------------------------------------------------------------------------------------------------------------ No results for input(s): CHOL, HDL, LDLCALC, TRIG, CHOLHDL, LDLDIRECT in the last 72 hours.  Lab Results  Component Value Date   HGBA1C 5.4 11/29/2023   ------------------------------------------------------------------------------------------------------------------ No results for input(s): TSH, T4TOTAL, T3FREE, THYROIDAB in the last 72 hours.  Invalid input(s): FREET3  Cardiac Enzymes No results for input(s): CKMB, TROPONINI, MYOGLOBIN in the last 168 hours.  Invalid input(s): CK ------------------------------------------------------------------------------------------------------------------    Component Value Date/Time   BNP 53.7 03/13/2024 0332    CBG: No results for input(s): GLUCAP in the last 168 hours.  Recent Results (from the past 240 hours)  Blood culture (routine x 2)     Status: None (Preliminary result)   Collection Time: 03/12/24  4:50 PM   Specimen: BLOOD  Result Value Ref Range Status   Specimen Description BLOOD RIGHT ANTECUBITAL  Final   Special Requests   Final    BOTTLES DRAWN AEROBIC AND ANAEROBIC  Blood Culture results may  not be optimal due to an inadequate volume of blood received in culture bottles   Culture   Final    NO GROWTH 2 DAYS Performed at [facility], [address] [place], [place] [address]    Report Status PENDING  Incomplete  Blood culture (routine x 2)     Status: None (Preliminary result)   Collection Time: 03/12/24  4:55 PM   Specimen: BLOOD  Result Value Ref Range Status   Specimen Description BLOOD LEFT ANTECUBITAL  Final   Special Requests   Final    BOTTLES DRAWN AEROBIC AND ANAEROBIC Blood Culture results may not be optimal due to an inadequate volume of blood received in culture bottles   Culture   Final    NO GROWTH 2 DAYS Performed at [facility], [address] [place], [place] [address]    Report Status PENDING  Incomplete  C Difficile Quick Screen w PCR reflex     Status: None   Collection Time: 03/13/24 12:05 PM   Specimen: STOOL  Result Value Ref Range Status   C Diff antigen NEGATIVE NEGATIVE Final   C Diff toxin NEGATIVE NEGATIVE Final   C Diff interpretation No C. difficile detected.  Final    Comment: Performed at [facility], [address] [place], [place] [address]  Gastrointestinal Panel by PCR , Stool     Status: None   Collection Time: 03/13/24 12:05 PM   Specimen: STOOL  Result Value Ref Range Status   Campylobacter species NOT DETECTED NOT DETECTED Final   Plesimonas shigelloides NOT DETECTED NOT DETECTED Final   Salmonella species NOT DETECTED NOT DETECTED Final   Yersinia enterocolitica NOT DETECTED NOT DETECTED Final   Vibrio species NOT DETECTED NOT DETECTED Final   Vibrio cholerae NOT DETECTED NOT DETECTED Final   Enteroaggregative E coli (EAEC) NOT DETECTED NOT DETECTED Final   Enteropathogenic E coli (EPEC) NOT DETECTED NOT DETECTED Final   Enterotoxigenic E coli (ETEC) NOT DETECTED NOT DETECTED Final   Shiga like toxin producing E coli (STEC) NOT DETECTED NOT DETECTED Final   Shigella/Enteroinvasive E coli (EIEC) NOT  DETECTED NOT DETECTED Final   Cryptosporidium NOT DETECTED NOT DETECTED Final   Cyclospora cayetanensis NOT DETECTED NOT DETECTED Final   Entamoeba histolytica NOT DETECTED NOT DETECTED Final   Giardia lamblia NOT DETECTED NOT DETECTED Final   Adenovirus F40/41 NOT DETECTED NOT DETECTED Final   Astrovirus NOT DETECTED NOT DETECTED Final   Norovirus GI/GII NOT DETECTED NOT DETECTED Final   Rotavirus A NOT DETECTED NOT DETECTED Final   Sapovirus (I, II, IV, and V) NOT DETECTED NOT DETECTED Final    Comment: Performed at [facility], [address], [place], [place] [address]     Radiology Studies: No results found.    Nilda Fendt, MD, PhD [facility]  Between 7 am - 7 pm I am available, please contact me via Amion (for emergencies) or Securechat (non urgent messages)  Between 7 pm - 7 am I am not available, please contact night coverage MD/APP via Amion

## 2024-03-15 DIAGNOSIS — R112 Nausea with vomiting, unspecified: Secondary | ICD-10-CM | POA: Diagnosis not present

## 2024-03-15 LAB — BLOOD GAS, ARTERIAL
Acid-base deficit: 0.3 mmol/L (ref 0.0–2.0)
Bicarbonate: 22.8 mmol/L (ref 20.0–28.0)
O2 Saturation: 97.9 %
Patient temperature: 37
pCO2 arterial: 32 mmHg (ref 32–48)
pH, Arterial: 7.46 — ABNORMAL HIGH (ref 7.35–7.45)
pO2, Arterial: 81 mmHg — ABNORMAL LOW (ref 83–108)

## 2024-03-15 LAB — MAGNESIUM: Magnesium: 1.7 mg/dL (ref 1.7–2.4)

## 2024-03-15 LAB — COMPREHENSIVE METABOLIC PANEL WITH GFR
ALT: 37 U/L (ref 0–44)
AST: 57 U/L — ABNORMAL HIGH (ref 15–41)
Albumin: 2.1 g/dL — ABNORMAL LOW (ref 3.5–5.0)
Alkaline Phosphatase: 65 U/L (ref 38–126)
Anion gap: 13 (ref 5–15)
BUN: 5 mg/dL — ABNORMAL LOW (ref 8–23)
CO2: 20 mmol/L — ABNORMAL LOW (ref 22–32)
Calcium: 8.1 mg/dL — ABNORMAL LOW (ref 8.9–10.3)
Chloride: 101 mmol/L (ref 98–111)
Creatinine, Ser: 0.84 mg/dL (ref 0.44–1.00)
GFR, Estimated: >60 mL/min (ref >60)
Glucose, Bld: 91 mg/dL (ref 70–99)
Potassium: 3.2 mmol/L — ABNORMAL LOW (ref 3.5–5.1)
Sodium: 134 mmol/L — ABNORMAL LOW (ref 135–145)
Total Bilirubin: 1 mg/dL (ref 0.0–1.2)
Total Protein: 5.6 g/dL — ABNORMAL LOW (ref 6.5–8.1)

## 2024-03-15 LAB — CBC
HCT: 27.6 % — ABNORMAL LOW (ref 36.0–46.0)
Hemoglobin: 9.7 g/dL — ABNORMAL LOW (ref 12.0–15.0)
MCH: 27.2 pg (ref 26.0–34.0)
MCHC: 35.1 g/dL (ref 30.0–36.0)
MCV: 77.3 fL — ABNORMAL LOW (ref 80.0–100.0)
Platelets: 262 K/uL (ref 150–400)
RBC: 3.57 MIL/uL — ABNORMAL LOW (ref 3.87–5.11)
RDW: 16.6 % — ABNORMAL HIGH (ref 11.5–15.5)
WBC: 10.4 K/uL (ref 4.0–10.5)
nRBC: 0 % (ref 0.0–0.2)

## 2024-03-15 LAB — PHOSPHORUS: Phosphorus: 2.8 mg/dL (ref 2.5–4.6)

## 2024-03-15 LAB — AMMONIA: Ammonia: 33 umol/L (ref 9–35)

## 2024-03-15 MED ORDER — MAGNESIUM SULFATE 2 GM/50ML IV SOLN
2.0000 g | Freq: Once | INTRAVENOUS | Status: AC
  Administered 2024-03-15: 2 g via INTRAVENOUS
  Filled 2024-03-15: qty 50

## 2024-03-15 MED ORDER — SODIUM CHLORIDE 0.9% FLUSH: 10.0000 mL | INTRAVENOUS | Status: DC | PRN

## 2024-03-15 MED ORDER — POTASSIUM CHLORIDE CRYS ER 20 MEQ PO TBCR
40.0000 meq | EXTENDED_RELEASE_TABLET | Freq: Once | ORAL | Status: AC
  Administered 2024-03-15: 40 meq via ORAL
  Filled 2024-03-15: qty 2

## 2024-03-15 MED ORDER — CHLORHEXIDINE GLUCONATE CLOTH 2 % EX PADS
6.0000 | MEDICATED_PAD | Freq: Every day | CUTANEOUS | Status: DC
  Administered 2024-03-15 – 2024-03-17 (×3): 6 via TOPICAL

## 2024-03-15 MED ORDER — SODIUM CHLORIDE 0.9% FLUSH
10.0000 mL | Freq: Two times a day (BID) | INTRAVENOUS | Status: DC
  Administered 2024-03-15 – 2024-03-17 (×5): 10 mL

## 2024-03-15 NOTE — Progress Notes (Addendum)
 Sent secure chat to Dr. Cheryll regarding patient being confused. Patient woke up and called family. Family concerned because patient is usually up at all hours and has not been confused before. Patient was confused when I went to check on her. After reorienting her for about 10 minutes she was able to answer all questions appropriately. Provider states In older patient it is very common to develop hospital-acquired delirium in the setting of UTI, sepsis and cellulitis..  Nothing to worry. Will continue to follow plan of care. Bed alarm on patient for safety

## 2024-03-15 NOTE — Progress Notes (Signed)
 PROGRESS NOTE  [name] FMW:[id number] DOB: 04-Jun-1951 DOA: 03/12/2024 PCP: Jefferey Fitch, MD   LOS: 2 days   Brief Narrative / Interim history: 73 y.o. female with medical history significant of metastatic breast cancer on trastuzumab , chronic HFpEF, history of PE on Eliquis , hypertension, hyperlipidemia, anxiety, class III obesity.  Recent hospital admission 6/19-6/26 for septic arthritis of sacroiliac joint, sacral osteomyelitis, left iliacus abscess with myositis, group B strep bacteremia, E. coli UTI.  Patient underwent CT-guided aspiration of left iliacus abscess.  Discharged home on daptomycin  and ceftriaxone  via port x 6 weeks (end date 8/4) per ID recommendation.  She now comes back to the hospital with nausea, vomiting, diarrhea and generalized abdominal pain for couple of days, unable to tolerate any p.o. intake.  She has been having chills.  In the ED she was found to have leukocytosis with white count of 16.7, she was tachycardic, and a CT scan of the abdomen and pelvis showed diffuse circumferential wall thickening of the esophagus, and he also showed ongoing septic arthritis and increased bony resorption and sclerosis in the left sacroiliac joint.  Subjective / 24h Interval events: Overnight she has been increasingly confused.  Remains somewhat confused this morning, she tells me that she is at [facility] but tells me also that it is because everybody else around her tells her that.  She really thinks stepdown that she is in a different facility than [facility].  She knows the year is 2025, and the month is July.  She denies any abdominal pain, nausea or vomiting this morning.  She could not remember why she was hospitalized  Assesement and Plan: Principal Problem:   Intractable nausea and vomiting Active Problems:   Hypokalemia   Septic arthritis (HCC)   Esophagitis   Urinary frequency   Diarrhea   SIRS (systemic inflammatory response syndrome)  (HCC)  Principal problem Intractable nausea, vomiting, concern for esophagitis -patient had an episode few months ago where she was admitted with hematemesis, EGD was done at that point which showed normal esophagus, congestive gastropathy so overall fairly unremarkable.  GI reengaged, appreciate input.  Continue PPI.  For now GI recommends conservative management, EGD if she is not improving - She is improving, no further nausea and vomiting.  This may have been a nonspecific gastroenteritis episode.  Advance diet to regular today  Active problems Acute metabolic encephalopathy -new onset this morning, unclear cause.  She has not received any medications that could cause this, she is on Cymbalta  that she is at home, also Xanax  and Restoril  are home medications.  The only new medication that she has been getting here is Zofran  which is very unlikely to cause confusion - Obtain ammonia level, ABG, obtain an MRI of the brain with and without contrast to rule out metastatic disease or any other intracranial processes  Septic arthritis of the left sacroiliac joint-appears that she is progressing some, ID consulted, no other new recommendations at this point other than continue her home medications - Blood cultures are pending, no growth to date  Urinary frequency-UA fairly unremarkable  Diarrhea-CT scan fairly unremarkable without any significant evidence of colitis.  C. difficile and GI pathogen panel both negative.  Diarrhea resolved  Metastatic breast cancer-on immunotherapy, outpatient follow-up.  MRI of the brain as above  History of prior PE-continue Eliquis   Anemia of chronic disease-hemoglobin stable, no bleeding  Hypokalemia, hypomagnesemia, hypophosphatemia-continue to replace potassium and magnesium  this morning.  Phosphorus is now normal  Troponin elevation-flat,  not in a pattern consistent with ACS, likely demand ischemia.  No chest pain, continue statin  Scheduled Meds:   amLODipine   10 mg Oral q morning   apixaban   5 mg Oral BID   carvedilol   12.5 mg Oral BID WC   DULoxetine   60 mg Oral Daily   feeding supplement  237 mL Oral BID BM   pantoprazole  (PROTONIX ) IV  40 mg Intravenous Q12H   potassium chloride   40 mEq Oral Once   rosuvastatin   10 mg Oral Daily   temazepam   30 mg Oral QHS   Continuous Infusions:  cefTRIAXone  (ROCEPHIN )  IV Stopped (03/14/24 1153)   DAPTOmycin  700 mg (03/14/24 1731)   magnesium  sulfate bolus IVPB     PRN Meds:.acetaminophen  **OR** acetaminophen , ALPRAZolam , ondansetron   Current Outpatient Medications  Medication Instructions   acetaminophen  (TYLENOL ) 500-1,000 mg, Every 6 hours PRN   albuterol  (VENTOLIN  HFA) 108 (90 Base) MCG/ACT inhaler 2 puffs, Inhalation, Every 6 hours PRN   ALPRAZolam  (XANAX ) 1 mg, Oral, 3 times daily PRN   amLODipine  (NORVASC ) 10 mg, Oral, Every morning   apixaban  (ELIQUIS ) 5 mg, Oral, 2 times daily   CALCIUM  PO 2 tablets, Daily   carvedilol  (COREG ) 12.5 mg, Oral, 2 times daily with meals   cefTRIAXone  (ROCEPHIN ) IVPB 2 g, Intravenous, Every 24 hours, Indication:  L-iliac fossa abscess First Dose: Yes Last Day of Therapy:  03/30/24 Labs - Once weekly:  CBC/D and BMP, Labs - Once weekly: ESR and CRP Method of administration: IV Push Method of administration may be changed at the discretion of home infusion pharmacist based upon assessment of the patient and/or caregiver's ability to self-administer the medication ordered.   cyclobenzaprine  (FLEXERIL ) 5 mg, Oral, 2 times daily PRN   daptomycin  (CUBICIN ) IVPB 700 mg, Intravenous, Every 24 hours, Indication:  L-iliac fossa abscess First Dose: Yes Last Day of Therapy:  03/30/24 Labs - Once weekly:  CBC/D, BMP, and CPK Labs - Once weekly: ESR and CRP Method of administration: IV Push Method of administration may be changed at the discretion of home infusion pharmacist based upon assessment of the patient and/or caregiver's ability to self-administer the  medication ordered.   DULoxetine  (CYMBALTA ) 60 mg, Daily   furosemide  (LASIX ) 40 mg, As needed   gabapentin  (NEURONTIN ) 300 MG capsule TAKE 2 CAPSULES BY MOUTH THREE TIMES DAILY   [Paused] losartan  (COZAAR ) 100 mg, Every morning   ondansetron  (ZOFRAN ) 8 mg, Oral, Every 8 hours PRN   Oxycodone  HCl 10 mg, Every 6 hours PRN   pantoprazole  (PROTONIX ) 40 mg, Oral, Daily   predniSONE (DELTASONE) 5 mg, Daily PRN   rosuvastatin  (CRESTOR ) 10 mg, Oral, Daily   [Paused] spironolactone  (ALDACTONE ) 25 MG tablet Take 1 tablet daily   temazepam  (RESTORIL ) 30 mg, Oral, Daily at bedtime    Diet Orders (From admission, onward)     Start     Ordered   03/15/24 0925  Diet regular Fluid consistency: Thin  Diet effective now       Question:  Fluid consistency:  Answer:  Thin   03/15/24 0924            DVT prophylaxis: Place and maintain sequential compression device Start: 03/13/24 0355 apixaban  (ELIQUIS ) tablet 5 mg   Lab Results  Component Value Date   PLT 262 03/15/2024      Code Status: Full Code  Family Communication: no family at bedside   Status is: Inpatient   Level of care: Telemetry Medical  Consultants:  ID GI  Objective: Vitals:   03/14/24 2316 03/15/24 0400 03/15/24 0730 03/15/24 0732  BP: (!) 139/93 132/74 121/72 121/72  Pulse: 88 84 86 86  Resp: 18 16    Temp: 99.3 F (37.4 C) 98.7 F (37.1 C) 98.6 F (37 C) 98.6 F (37 C)  TempSrc:  Oral Oral Oral  SpO2: 97% 98% 97% 97%  Weight:      Height:        Intake/Output Summary (Last 24 hours) at 03/15/2024 0924 Last data filed at 03/14/2024 1646 Gross per 24 hour  Intake 338.57 ml  Output --  Net 338.57 ml   Wt Readings from Last 3 Encounters:  03/12/24 117.5 kg  02/13/24 102.1 kg  02/12/24 102.1 kg    Examination: Constitutional: NAD, confused Eyes: lids and conjunctivae normal, no scleral icterus ENMT: mmm Neck: normal, supple Respiratory: clear to auscultation bilaterally, no wheezing, no  crackles. Normal respiratory effort.  Cardiovascular: Regular rate and rhythm, no murmurs / rubs / gallops. No LE edema. Abdomen: soft, no distention, no tenderness. Bowel sounds positive.  Skin: no rashes  Data Reviewed: I have independently reviewed following labs and imaging studies   CBC Recent Labs  Lab 03/12/24 1653 03/13/24 0332 03/14/24 0237 03/15/24 0256  WBC 16.7* 13.0* 10.0 10.4  HGB 11.4* 10.0* 9.1* 9.7*  HCT 32.1* 28.0* 25.8* 27.6*  PLT 347 265 263 262  MCV 77.5* 77.3* 78.2* 77.3*  MCH 27.5 27.6 27.6 27.2  MCHC 35.5 35.7 35.3 35.1  RDW 16.6* 16.6* 16.7* 16.6*  LYMPHSABS 2.5  --   --   --   MONOABS 1.5*  --   --   --   EOSABS 0.6*  --   --   --   BASOSABS 0.1  --   --   --     Recent Labs  Lab 03/12/24 1653 03/12/24 1701 03/13/24 0108 03/13/24 0111 03/13/24 0332 03/14/24 0237 03/15/24 0256  NA 138  --   --   --  138 136 134*  K 2.9*  --   --   --  2.9* 3.3* 3.2*  CL 100  --   --   --  103 105 101  CO2 22  --   --   --  22 21* 20*  GLUCOSE 120*  --   --   --  85 71 91  BUN 6*  --   --   --  5* 5* 5*  CREATININE 0.93  --   --   --  0.93 0.98 0.84  CALCIUM  8.5*  --   --   --  8.0* 7.9* 8.1*  AST 55*  --   --   --  43* 51* 57*  ALT 36  --   --   --  30 30 37  ALKPHOS 61  --   --   --  53 51 65  BILITOT 1.1  --   --   --  1.2 1.2 1.0  ALBUMIN 2.4*  --   --   --  2.1* 1.9* 2.1*  MG  --   --   --   --  1.4* 1.7 1.7  CRP  --   --  5.3*  --   --   --   --   LATICACIDVEN  --  1.4  --  1.4  --   --   --   BNP  --   --   --   --  53.7  --   --     ------------------------------------------------------------------------------------------------------------------  No results for input(s): CHOL, HDL, LDLCALC, TRIG, CHOLHDL, LDLDIRECT in the last 72 hours.  Lab Results  Component Value Date   HGBA1C 5.4 11/29/2023   ------------------------------------------------------------------------------------------------------------------ No results for  input(s): TSH, T4TOTAL, T3FREE, THYROIDAB in the last 72 hours.  Invalid input(s): FREET3  Cardiac Enzymes No results for input(s): CKMB, TROPONINI, MYOGLOBIN in the last 168 hours.  Invalid input(s): CK ------------------------------------------------------------------------------------------------------------------    Component Value Date/Time   BNP 53.7 03/13/2024 0332    CBG: No results for input(s): GLUCAP in the last 168 hours.  Recent Results (from the past 240 hours)  Blood culture (routine x 2)     Status: None (Preliminary result)   Collection Time: 03/12/24  4:50 PM   Specimen: BLOOD  Result Value Ref Range Status   Specimen Description BLOOD RIGHT ANTECUBITAL  Final   Special Requests   Final    BOTTLES DRAWN AEROBIC AND ANAEROBIC Blood Culture results may not be optimal due to an inadequate volume of blood received in culture bottles   Culture   Final    NO GROWTH 3 DAYS Performed at [facility], [address], [place], [place] [address]    Report Status PENDING  Incomplete  Blood culture (routine x 2)     Status: None (Preliminary result)   Collection Time: 03/12/24  4:55 PM   Specimen: BLOOD  Result Value Ref Range Status   Specimen Description BLOOD LEFT ANTECUBITAL  Final   Special Requests   Final    BOTTLES DRAWN AEROBIC AND ANAEROBIC Blood Culture results may not be optimal due to an inadequate volume of blood received in culture bottles   Culture   Final    NO GROWTH 3 DAYS Performed at [facility], [address], [place], [place] [address]    Report Status PENDING  Incomplete  C Difficile Quick Screen w PCR reflex     Status: None   Collection Time: 03/13/24 12:05 PM   Specimen: STOOL  Result Value Ref Range Status   C Diff antigen NEGATIVE NEGATIVE Final   C Diff toxin NEGATIVE NEGATIVE Final   C Diff interpretation No C. difficile detected.  Final    Comment: Performed at [facility], [address], [place], [place] [address]  Gastrointestinal Panel by PCR , Stool     Status: None   Collection Time: 03/13/24 12:05 PM   Specimen: STOOL  Result Value Ref Range Status   Campylobacter species NOT DETECTED NOT DETECTED Final   Plesimonas shigelloides NOT DETECTED NOT DETECTED Final   Salmonella species NOT DETECTED NOT DETECTED Final   Yersinia enterocolitica NOT DETECTED NOT DETECTED Final   Vibrio species NOT DETECTED NOT DETECTED Final   Vibrio cholerae NOT DETECTED NOT DETECTED Final   Enteroaggregative E coli (EAEC) NOT DETECTED NOT DETECTED Final   Enteropathogenic E coli (EPEC) NOT DETECTED NOT DETECTED Final   Enterotoxigenic E coli (ETEC) NOT DETECTED NOT DETECTED Final   Shiga like toxin producing E coli (STEC) NOT DETECTED NOT DETECTED Final   Shigella/Enteroinvasive E coli (EIEC) NOT DETECTED NOT DETECTED Final   Cryptosporidium NOT DETECTED NOT DETECTED Final   Cyclospora cayetanensis NOT DETECTED NOT DETECTED Final   Entamoeba histolytica NOT DETECTED NOT DETECTED Final   Giardia lamblia NOT DETECTED NOT DETECTED Final   Adenovirus F40/41 NOT DETECTED NOT DETECTED Final   Astrovirus NOT DETECTED NOT DETECTED Final   Norovirus GI/GII NOT DETECTED NOT DETECTED Final   Rotavirus  A NOT DETECTED NOT DETECTED Final   Sapovirus (I, II, IV, and V) NOT DETECTED NOT DETECTED Final    Comment: Performed at [facility], [address], [place], [place] [address]     Radiology Studies: No results found.    Nilda Fendt, MD, PhD [facility]  Between 7 am - 7 pm I am available, please contact me via Amion (for emergencies) or Securechat (non urgent messages)  Between 7 pm - 7 am I am not available, please contact night coverage MD/APP via Amion

## 2024-03-15 NOTE — Plan of Care (Signed)

## 2024-03-15 NOTE — Progress Notes (Signed)
 Patient ID: [name], female   DOB: 24-May-1951, 73 y.o.   MRN: [id number]    Progress Note   Subjective  Day # 2 CC; acute nausea vomiting abdominal pain and diarrhea, and patient with metastatic breast cancer on immunotherapy and currently IV antibiotics for joint infection  GI path panel and C. difficile quick screen negative  Labs today WBC 10.4/hemoglobin 9.7/hematocrit 27.6/MCV 77 Potassium 3.2/BUN 5/creatinine 0.84 Magnesium  1.7/phosphorus 2.8 ABG pending  Patient developed some confusion overnight.  Seen currently with her son at bedside she was able to answer questions appropriately and is oriented, and knows that she has been confused.  Fortunately nausea vomiting abdominal pain and diarrhea have all resolved and she is feeling significantly better from that standpoint.    Objective   Vital signs in last 24 hours: Temp:  [98.1 F (36.7 C)-99.3 F (37.4 C)] 98.8 F (37.1 C) (07/20 1203) Pulse Rate:  [79-88] 87 (07/20 1203) Resp:  [16-18] 16 (07/20 0400) BP: (111-139)/(60-93) 120/60 (07/20 1203) SpO2:  [95 %-100 %] 100 % (07/20 1203) Last BM Date : 03/14/24 General:    Older African-American female in NAD, sitting at the side of the bed Heart:  Regular rate and rhythm; no murmurs Lungs: Respirations even and unlabored, lungs CTA bilaterally Abdomen:  Soft, nontender and nondistended. Normal bowel sounds. Extremities:  Without edema. Neurologic:  Alert and oriented,  grossly normal neurologically. Psych:  Cooperative. Normal mood and affect.  Intake/Output from previous day: 07/19 0701 - 07/20 0700 In: 338.6 [IV Piggyback:338.6] Out: -  Intake/Output this shift: No intake/output data recorded.  Lab Results: Recent Labs    03/13/24 0332 03/14/24 0237 03/15/24 0256  WBC 13.0* 10.0 10.4  HGB 10.0* 9.1* 9.7*  HCT 28.0* 25.8* 27.6*  PLT 265 263 262   BMET Recent Labs    03/13/24 0332 03/14/24 0237 03/15/24 0256  NA 138 136 134*  K 2.9* 3.3* 3.2*   CL 103 105 101  CO2 22 21* 20*  GLUCOSE 85 71 91  BUN 5* 5* 5*  CREATININE 0.93 0.98 0.84  CALCIUM  8.0* 7.9* 8.1*   LFT Recent Labs    03/15/24 0256  PROT 5.6*  ALBUMIN 2.1*  AST 57*  ALT 37  ALKPHOS 65  BILITOT 1.0   PT/INR No results for input(s): LABPROT, INR in the last 72 hours.       Assessment / Plan:    #67 73 year old female with metastatic breast cancer to liver and lungs currently on trastuzumab  q. 28 days with recent hospitalization for septic arthritis of the left SI joint and associated osteomyelitis. Group B strep bacteremia at that time and now on IV antibiotics at home with daptomycin  and Rocephin  x 6 weeks  Admitted with 2-day history of nausea vomiting diarrhea and abdominal pain/cramping inability to keep down p.o.'s. C. difficile quick screen and GI path panel negative  CT did show some thickening of the esophagus, trace right pleural effusion no bowel wall thickening.  Patient just had a EGD in May 2025 when hospitalized with nausea and vomiting and had a normal-appearing esophagus at that time, but was found to have gastritis  Very likely this was an infectious gastroenteritis despite us  not being able to identify the organism.  Symptoms have resolved.  #2 confusion, workup in progress #3 anemia, chronic consistent with anemia of chronic disease #4 electrolyte abnormalities secondary to acute illness-correcting  Plan; GI will sign off, available if needed    Principal Problem:   Intractable  nausea and vomiting Active Problems:   Hypokalemia   Septic arthritis (HCC)   Esophagitis   Urinary frequency   Diarrhea   SIRS (systemic inflammatory response syndrome) (HCC)     LOS: 2 days   Gopal Malter PA-C 03/15/2024, 12:21 PM

## 2024-03-15 NOTE — Progress Notes (Signed)
 Assessed patient's port. Per pt, was accessed by home health and she has needle changes every Monday. She receives daily medicine through it and then additional medicine every 28 days. No redness, tenderness or pain around port site. Pt denies complaints at this time. Port flushed easily and had good blood return.

## 2024-03-15 NOTE — Plan of Care (Signed)

## 2024-03-16 ENCOUNTER — Other Ambulatory Visit: Payer: Self-pay | Admitting: Hematology and Oncology

## 2024-03-16 ENCOUNTER — Inpatient Hospital Stay (HOSPITAL_COMMUNITY)

## 2024-03-16 DIAGNOSIS — R7881 Bacteremia: Secondary | ICD-10-CM

## 2024-03-16 DIAGNOSIS — R112 Nausea with vomiting, unspecified: Secondary | ICD-10-CM | POA: Diagnosis not present

## 2024-03-16 DIAGNOSIS — D72829 Elevated white blood cell count, unspecified: Secondary | ICD-10-CM

## 2024-03-16 DIAGNOSIS — M4628 Osteomyelitis of vertebra, sacral and sacrococcygeal region: Secondary | ICD-10-CM

## 2024-03-16 DIAGNOSIS — B951 Streptococcus, group B, as the cause of diseases classified elsewhere: Secondary | ICD-10-CM

## 2024-03-16 DIAGNOSIS — N739 Female pelvic inflammatory disease, unspecified: Secondary | ICD-10-CM

## 2024-03-16 LAB — MAGNESIUM: Magnesium: 1.9 mg/dL (ref 1.7–2.4)

## 2024-03-16 LAB — BASIC METABOLIC PANEL WITH GFR
Anion gap: 10 (ref 5–15)
BUN: <5 mg/dL — ABNORMAL LOW (ref 8–23)
CO2: 21 mmol/L — ABNORMAL LOW (ref 22–32)
Calcium: 8 mg/dL — ABNORMAL LOW (ref 8.9–10.3)
Chloride: 103 mmol/L (ref 98–111)
Creatinine, Ser: 0.8 mg/dL (ref 0.44–1.00)
GFR, Estimated: >60 mL/min (ref >60)
Glucose, Bld: 88 mg/dL (ref 70–99)
Potassium: 3.5 mmol/L (ref 3.5–5.1)
Sodium: 134 mmol/L — ABNORMAL LOW (ref 135–145)

## 2024-03-16 LAB — CBC
HCT: 27.9 % — ABNORMAL LOW (ref 36.0–46.0)
Hemoglobin: 10.1 g/dL — ABNORMAL LOW (ref 12.0–15.0)
MCH: 27.5 pg (ref 26.0–34.0)
MCHC: 36.2 g/dL — ABNORMAL HIGH (ref 30.0–36.0)
MCV: 76 fL — ABNORMAL LOW (ref 80.0–100.0)
Platelets: 275 K/uL (ref 150–400)
RBC: 3.67 MIL/uL — ABNORMAL LOW (ref 3.87–5.11)
RDW: 16.2 % — ABNORMAL HIGH (ref 11.5–15.5)
WBC: 10.6 K/uL — ABNORMAL HIGH (ref 4.0–10.5)
nRBC: 0 % (ref 0.0–0.2)

## 2024-03-16 MED ORDER — GADOBUTROL 1 MMOL/ML IV SOLN
10.0000 mL | Freq: Once | INTRAVENOUS | Status: AC | PRN
  Administered 2024-03-16: 10 mL via INTRAVENOUS

## 2024-03-16 MED ORDER — POTASSIUM CHLORIDE CRYS ER 20 MEQ PO TBCR
40.0000 meq | EXTENDED_RELEASE_TABLET | Freq: Once | ORAL | Status: AC
  Administered 2024-03-16: 40 meq via ORAL
  Filled 2024-03-16: qty 2

## 2024-03-16 MED ORDER — PANTOPRAZOLE SODIUM 40 MG PO TBEC
40.0000 mg | DELAYED_RELEASE_TABLET | Freq: Two times a day (BID) | ORAL | Status: DC
  Administered 2024-03-16 – 2024-03-17 (×2): 40 mg via ORAL
  Filled 2024-03-16 (×2): qty 1

## 2024-03-16 NOTE — Plan of Care (Signed)

## 2024-03-16 NOTE — Progress Notes (Signed)
 PHARMACY CONSULT NOTE FOR:  OUTPATIENT  PARENTERAL ANTIBIOTIC THERAPY (OPAT)  Indication: L-iliac fossa abscess Regimen: Daptomycin  700 mg IV every 24 hours + Rocephin  2g IV every 24 hours End date: 03/30/24 (6 weeks from BCx clearance 6/23)  IV antibiotic discharge orders are pended. To discharging provider:  please sign these orders via discharge navigator,  Select New Orders & click on the button choice - Manage This Unsigned Work.     Thank you for allowing pharmacy to be a part of this patient's care.  Damien Quiet, PharmD, BCPS, BCIDP Infectious Diseases Clinical Pharmacist Phone: [phone] 03/16/2024 1:38 PM   **Pharmacist phone directory can now be found on [email or web address] ([email or web address]).  Listed under [facility].

## 2024-03-16 NOTE — Progress Notes (Signed)
 PROGRESS NOTE  [name] FMW:[id number] DOB: 06/08/1951 DOA: 03/12/2024 PCP: Jefferey Fitch, MD   LOS: 3 days   Brief Narrative / Interim history: 73 y.o. female with medical history significant of metastatic breast cancer on trastuzumab , chronic HFpEF, history of PE on Eliquis , hypertension, hyperlipidemia, anxiety, class III obesity.  Recent hospital admission 6/19-6/26 for septic arthritis of sacroiliac joint, sacral osteomyelitis, left iliacus abscess with myositis, group B strep bacteremia, E. coli UTI.  Patient underwent CT-guided aspiration of left iliacus abscess.  Discharged home on daptomycin  and ceftriaxone  via port x 6 weeks (end date 8/4) per ID recommendation.  She now comes back to the hospital with nausea, vomiting, diarrhea and generalized abdominal pain for couple of days, unable to tolerate any p.o. intake.  She has been having chills.  In the ED she was found to have leukocytosis with white count of 16.7, she was tachycardic, and a CT scan of the abdomen and pelvis showed diffuse circumferential wall thickening of the esophagus, and he also showed ongoing septic arthritis and increased bony resorption and sclerosis in the left sacroiliac joint.  Subjective / 24h Interval events: She is feeling better this morning.  She is no longer confused.  Had a better night.  Assesement and Plan: Principal Problem:   Intractable nausea and vomiting Active Problems:   Hypokalemia   Septic arthritis (HCC)   Esophagitis   Urinary frequency   Diarrhea   SIRS (systemic inflammatory response syndrome) (HCC)  Principal problem Intractable nausea, vomiting, concern for esophagitis -patient had an episode few months ago where she was admitted with hematemesis, EGD was done at that point which showed normal esophagus, congestive gastropathy so overall fairly unremarkable.  GI reengaged, appreciate input.  Continue PPI.  For now GI recommends conservative management, EGD if she is not  improving - She is improving, no further nausea and vomiting.  This may have been a nonspecific gastroenteritis episode.  He is tolerating a regular diet.  GI signed off  Active problems Acute metabolic encephalopathy -new onset 7/20 morning, unclear cause.  She has not received any medications that could cause this, she is on Cymbalta  that she is at home, also Xanax  and Restoril  are home medications.  The only new medication that she has been getting here is Zofran  which is very unlikely to cause confusion - Ammonia and ABG unremarkable.  An MRI of the brain has been ordered but not done yet.  Hopefully will get done this morning.  Septic arthritis of the left sacroiliac joint-appears that she is progressing some, ID consulted, no other new recommendations at this point other than continue her home medications - Blood cultures are pending, still without growth  Urinary frequency-UA fairly unremarkable  Diarrhea-CT scan fairly unremarkable without any significant evidence of colitis.  C. difficile and GI pathogen panel both negative.  The area resolved  Metastatic breast cancer-on immunotherapy, outpatient follow-up.  MRI of the brain as above to rule out metastatic disease  History of prior PE-continue Eliquis   Anemia of chronic disease-hemoglobin stable, no bleeding  Hypokalemia, hypomagnesemia, hypophosphatemia-electrolytes overall better.  Troponin elevation-flat, not in a pattern consistent with ACS, likely demand ischemia.  No chest pain, continue statin  Scheduled Meds:  amLODipine   10 mg Oral q morning   apixaban   5 mg Oral BID   carvedilol   12.5 mg Oral BID WC   Chlorhexidine  Gluconate Cloth  6 each Topical Daily   DULoxetine   60 mg Oral Daily   feeding supplement  237 mL Oral BID BM   pantoprazole  (PROTONIX ) IV  40 mg Intravenous Q12H   rosuvastatin   10 mg Oral Daily   sodium chloride  flush  10-40 mL Intracatheter Q12H   temazepam   30 mg Oral QHS   Continuous  Infusions:  cefTRIAXone  (ROCEPHIN )  IV 2 g (03/15/24 0931)   DAPTOmycin  700 mg (03/15/24 1607)   PRN Meds:.acetaminophen  **OR** acetaminophen , ALPRAZolam , ondansetron , sodium chloride  flush  Current Outpatient Medications  Medication Instructions   acetaminophen  (TYLENOL ) 500-1,000 mg, Every 6 hours PRN   albuterol  (VENTOLIN  HFA) 108 (90 Base) MCG/ACT inhaler 2 puffs, Inhalation, Every 6 hours PRN   ALPRAZolam  (XANAX ) 1 mg, Oral, 3 times daily PRN   amLODipine  (NORVASC ) 10 mg, Oral, Every morning   apixaban  (ELIQUIS ) 5 mg, Oral, 2 times daily   CALCIUM  PO 2 tablets, Daily   carvedilol  (COREG ) 12.5 mg, Oral, 2 times daily with meals   cefTRIAXone  (ROCEPHIN ) IVPB 2 g, Intravenous, Every 24 hours, Indication:  L-iliac fossa abscess First Dose: Yes Last Day of Therapy:  03/30/24 Labs - Once weekly:  CBC/D and BMP, Labs - Once weekly: ESR and CRP Method of administration: IV Push Method of administration may be changed at the discretion of home infusion pharmacist based upon assessment of the patient and/or caregiver's ability to self-administer the medication ordered.   cyclobenzaprine  (FLEXERIL ) 5 mg, Oral, 2 times daily PRN   daptomycin  (CUBICIN ) IVPB 700 mg, Intravenous, Every 24 hours, Indication:  L-iliac fossa abscess First Dose: Yes Last Day of Therapy:  03/30/24 Labs - Once weekly:  CBC/D, BMP, and CPK Labs - Once weekly: ESR and CRP Method of administration: IV Push Method of administration may be changed at the discretion of home infusion pharmacist based upon assessment of the patient and/or caregiver's ability to self-administer the medication ordered.   DULoxetine  (CYMBALTA ) 60 mg, Daily   furosemide  (LASIX ) 40 mg, As needed   gabapentin  (NEURONTIN ) 300 MG capsule TAKE 2 CAPSULES BY MOUTH THREE TIMES DAILY   [Paused] losartan  (COZAAR ) 100 mg, Every morning   ondansetron  (ZOFRAN ) 8 mg, Oral, Every 8 hours PRN   Oxycodone  HCl 10 mg, Every 6 hours PRN   pantoprazole  (PROTONIX )  40 mg, Oral, Daily   predniSONE (DELTASONE) 5 mg, Daily PRN   rosuvastatin  (CRESTOR ) 10 mg, Oral, Daily   [Paused] spironolactone  (ALDACTONE ) 25 MG tablet Take 1 tablet daily   temazepam  (RESTORIL ) 30 mg, Oral, Daily at bedtime    Diet Orders (From admission, onward)     Start     Ordered   03/15/24 0925  Diet regular Fluid consistency: Thin  Diet effective now       Question:  Fluid consistency:  Answer:  Thin   03/15/24 0924            DVT prophylaxis: Place and maintain sequential compression device Start: 03/13/24 0355 apixaban  (ELIQUIS ) tablet 5 mg   Lab Results  Component Value Date   PLT 275 03/16/2024      Code Status: Full Code  Family Communication: no family at bedside   Status is: Inpatient   Level of care: Telemetry Medical  Consultants:  ID GI  Objective: Vitals:   03/15/24 1955 03/16/24 0400 03/16/24 0733 03/16/24 0832  BP: 115/73 112/70 128/80 128/80  Pulse: 82 80 89 89  Resp: 18 18    Temp: 98.9 F (37.2 C) 98.5 F (36.9 C) 98.9 F (37.2 C)   TempSrc: Oral Oral Oral   SpO2: 99% 98% 95%  Weight:      Height:       No intake or output data in the 24 hours ending 03/16/24 0839  Wt Readings from Last 3 Encounters:  03/12/24 117.5 kg  02/13/24 102.1 kg  02/12/24 102.1 kg    Examination: Constitutional: NAD Eyes: lids and conjunctivae normal, no scleral icterus ENMT: mmm Neck: normal, supple Respiratory: clear to auscultation bilaterally, no wheezing, no crackles. Normal respiratory effort.  Cardiovascular: Regular rate and rhythm, no murmurs / rubs / gallops. No LE edema. Abdomen: soft, no distention, no tenderness. Bowel sounds positive.   Data Reviewed: I have independently reviewed following labs and imaging studies   CBC Recent Labs  Lab 03/12/24 1653 03/13/24 0332 03/14/24 0237 03/15/24 0256 03/16/24 0354  WBC 16.7* 13.0* 10.0 10.4 10.6*  HGB 11.4* 10.0* 9.1* 9.7* 10.1*  HCT 32.1* 28.0* 25.8* 27.6* 27.9*  PLT 347  265 263 262 275  MCV 77.5* 77.3* 78.2* 77.3* 76.0*  MCH 27.5 27.6 27.6 27.2 27.5  MCHC 35.5 35.7 35.3 35.1 36.2*  RDW 16.6* 16.6* 16.7* 16.6* 16.2*  LYMPHSABS 2.5  --   --   --   --   MONOABS 1.5*  --   --   --   --   EOSABS 0.6*  --   --   --   --   BASOSABS 0.1  --   --   --   --     Recent Labs  Lab 03/12/24 1653 03/12/24 1701 03/13/24 0108 03/13/24 0111 03/13/24 0332 03/14/24 0237 03/15/24 0256 03/15/24 1042 03/16/24 0354  NA 138  --   --   --  138 136 134*  --  134*  K 2.9*  --   --   --  2.9* 3.3* 3.2*  --  3.5  CL 100  --   --   --  103 105 101  --  103  CO2 22  --   --   --  22 21* 20*  --  21*  GLUCOSE 120*  --   --   --  85 71 91  --  88  BUN 6*  --   --   --  5* 5* 5*  --  <5*  CREATININE 0.93  --   --   --  0.93 0.98 0.84  --  0.80  CALCIUM  8.5*  --   --   --  8.0* 7.9* 8.1*  --  8.0*  AST 55*  --   --   --  43* 51* 57*  --   --   ALT 36  --   --   --  30 30 37  --   --   ALKPHOS 61  --   --   --  53 51 65  --   --   BILITOT 1.1  --   --   --  1.2 1.2 1.0  --   --   ALBUMIN 2.4*  --   --   --  2.1* 1.9* 2.1*  --   --   MG  --   --   --   --  1.4* 1.7 1.7  --  1.9  CRP  --   --  5.3*  --   --   --   --   --   --   LATICACIDVEN  --  1.4  --  1.4  --   --   --   --   --  AMMONIA  --   --   --   --   --   --   --  33  --   BNP  --   --   --   --  53.7  --   --   --   --     ------------------------------------------------------------------------------------------------------------------ No results for input(s): CHOL, HDL, LDLCALC, TRIG, CHOLHDL, LDLDIRECT in the last 72 hours.  Lab Results  Component Value Date   HGBA1C 5.4 11/29/2023   ------------------------------------------------------------------------------------------------------------------ No results for input(s): TSH, T4TOTAL, T3FREE, THYROIDAB in the last 72 hours.  Invalid input(s): FREET3  Cardiac Enzymes No results for input(s): CKMB, TROPONINI, MYOGLOBIN in  the last 168 hours.  Invalid input(s): CK ------------------------------------------------------------------------------------------------------------------    Component Value Date/Time   BNP 53.7 03/13/2024 0332    CBG: No results for input(s): GLUCAP in the last 168 hours.  Recent Results (from the past 240 hours)  Blood culture (routine x 2)     Status: None (Preliminary result)   Collection Time: 03/12/24  4:50 PM   Specimen: BLOOD  Result Value Ref Range Status   Specimen Description BLOOD RIGHT ANTECUBITAL  Final   Special Requests   Final    BOTTLES DRAWN AEROBIC AND ANAEROBIC Blood Culture results may not be optimal due to an inadequate volume of blood received in culture bottles   Culture   Final    NO GROWTH 3 DAYS Performed at [facility], [address] [place], [place] [address]    Report Status PENDING  Incomplete  Blood culture (routine x 2)     Status: None (Preliminary result)   Collection Time: 03/12/24  4:55 PM   Specimen: BLOOD  Result Value Ref Range Status   Specimen Description BLOOD LEFT ANTECUBITAL  Final   Special Requests   Final    BOTTLES DRAWN AEROBIC AND ANAEROBIC Blood Culture results may not be optimal due to an inadequate volume of blood received in culture bottles   Culture   Final    NO GROWTH 3 DAYS Performed at [facility], [address], [place], [place] [address]    Report Status PENDING  Incomplete  C Difficile Quick Screen w PCR reflex     Status: None   Collection Time: 03/13/24 12:05 PM   Specimen: STOOL  Result Value Ref Range Status   C Diff antigen NEGATIVE NEGATIVE Final   C Diff toxin NEGATIVE NEGATIVE Final   C Diff interpretation No C. difficile detected.  Final    Comment: Performed at [facility], [address] [place], [place] [address]  Gastrointestinal Panel by PCR , Stool     Status: None   Collection Time: 03/13/24 12:05 PM   Specimen: STOOL  Result Value Ref Range Status    Campylobacter species NOT DETECTED NOT DETECTED Final   Plesimonas shigelloides NOT DETECTED NOT DETECTED Final   Salmonella species NOT DETECTED NOT DETECTED Final   Yersinia enterocolitica NOT DETECTED NOT DETECTED Final   Vibrio species NOT DETECTED NOT DETECTED Final   Vibrio cholerae NOT DETECTED NOT DETECTED Final   Enteroaggregative E coli (EAEC) NOT DETECTED NOT DETECTED Final   Enteropathogenic E coli (EPEC) NOT DETECTED NOT DETECTED Final   Enterotoxigenic E coli (ETEC) NOT DETECTED NOT DETECTED Final   Shiga like toxin producing E coli (STEC) NOT DETECTED NOT DETECTED Final   Shigella/Enteroinvasive E coli (EIEC) NOT DETECTED NOT DETECTED Final   Cryptosporidium NOT DETECTED NOT DETECTED  Final   Cyclospora cayetanensis NOT DETECTED NOT DETECTED Final   Entamoeba histolytica NOT DETECTED NOT DETECTED Final   Giardia lamblia NOT DETECTED NOT DETECTED Final   Adenovirus F40/41 NOT DETECTED NOT DETECTED Final   Astrovirus NOT DETECTED NOT DETECTED Final   Norovirus GI/GII NOT DETECTED NOT DETECTED Final   Rotavirus A NOT DETECTED NOT DETECTED Final   Sapovirus (I, II, IV, and V) NOT DETECTED NOT DETECTED Final    Comment: Performed at [facility], [address], [place], [place] [address]     Radiology Studies: No results found.    Nilda Fendt, MD, PhD [facility]  Between 7 am - 7 pm I am available, please contact me via Amion (for emergencies) or Securechat (non urgent messages)  Between 7 pm - 7 am I am not available, please contact night coverage MD/APP via Amion

## 2024-03-16 NOTE — Progress Notes (Signed)
 Transition of Care [facility]) - Inpatient Brief Assessment   Patient Details  Name: [name] MRN: [id number] Date of Birth: 03-Jul-1951  Transition of Care [facility]) CM/SW Contact:    Rosaline JONELLE Joe, RN Phone Number: 03/16/2024, 4:46 PM   Clinical Narrative: Patient admitted for esophagitis and plans to return home on continued IV antibiotics.  Patient lives at home with her spouse and will likely discharge home tomorrow if stable per MD.   Patient is already active for Iv antibiotics at home through [place] and [facility], [facility] with followin the patient for continued IV antibiotics at the home through 03/30/24 through her current Right chest port-a-cath.  MD was asked to co-sign OPAT.  Patient is active with [facility] for RN, PT.  HH orders placed for [facility] PT, RN - MD asked to co-sign.  DMe at the home includes [place], [facility], electric wheelchair, WC, bedside commode.  Patient will likely discharge home tomorrow with spouse via family car.   Transition of Care Asessment: Insurance and Status: Insurance coverage has been reviewed Patient has primary care physician: Yes Home environment has been reviewed: from home with spouse Prior level of function:: Rw, WC, self Prior/Current Home Services: Current home services (Active with [facility] for PT, RN) Social Drivers of Health Review: SDOH reviewed needs interventions Readmission risk has been reviewed: Yes Transition of care needs: (P) transition of care needs identified, TOC will continue to follow

## 2024-03-16 NOTE — Care Management Important Message (Signed)
 Important Message  Patient Details  Name: [name] MRN: [id number] Date of Birth: 05-Nov-1950   Important Message Given:  Yes - Medicare IM     Claretta Deed 03/16/2024, 3:42 PM

## 2024-03-16 NOTE — Progress Notes (Signed)
 [facility]  Date of Admission:  03/12/2024   Total days of inpatient antibiotics 4  Principal Problem:   Intractable nausea and vomiting Active Problems:   Hypokalemia   Septic arthritis (HCC)   Esophagitis   Urinary frequency   Diarrhea   SIRS (systemic inflammatory response syndrome) (HCC)          Assessment: 73 year old female with septic joint arthritis and osteomyelitis status post aspiration discharged on Dapto and ceftriaxone  x 6 weeks through 8/4 presented with nausea and vomiting #Nausea/vomiting/diarrhea #Esophageal thickening #Leukocytosis -CT chest abdomen pelvis with contrast showed diffuse, circumferential wall thickening of the esophagus consistent with infectious inflammatory esophagitis, interval increase in bone resorption and sclerosis of the left sacroiliac joint concerning for septic arthritis. -Repeat blood cultures remain negative.  Stool studies with C. difficile and GIP negative. - Seen by GI who noted all the pyocin panels are negative clinical picture seems consistent with acute gastroenteritis with abrupt onset and rapid improvement without clinical active prevention.  I agree with GIs assessment. - As far as increased bone resorption seen on CT.  Would expect this is due to imaging lag as imaging sometimes looks worse, in spite of clinical improvement.  No concern for abscess noted. Recommendations:  - Continue outpatient antibiotics daptomycin  and ceftriaxone .  Same EOT 8/4 - Follow-up with ID outpatient - ID will sign off  Diagnosis:   Left pelvic abscess and sacroiliac osteomyelitis complicated by group B streptococcus bacteremia   Allergies       Allergies  Allergen Reactions   Adhesive [Tape] Other (See Comments)      Tears the skin     Penicillins Hives and Other (See Comments)      PATIENT HAS TOLERATED Cephalosporins        OPAT Orders Discharge antibiotics to be given via PICC line Discharge  antibiotics: Daptomycin  700 mg IV every 24 and ceftriaxone  2 g IV every 24. Per pharmacy protocol    Duration: 6 weeks End Date: 03/30/2024   [facility] Care Per Protocol:   Home health RN for IV administration and teaching; PICC line care and labs.     Labs weekly while on IV antibiotics: _x_ CBC with differential _x_ BMP __ CMP _x_ CRP _x_ ESR __ Vancomycin  trough _x_ CK   __ Please pull PIC at completion of IV antibiotics __ Please leave PIC in place until doctor has seen patient or been notified **Port in place.  Do not remove.     Fax weekly labs to [phone]   Clinic Follow Up Appt:   03/23/2024 with Dr. Dea  Evaluation of this patient requires complex antimicrobial therapy evaluation and counseling + isolation needs for disease transmission risk assessment and mitigation   Microbiology:   Antibiotics: [place] ceftriaxone   Cultures: Blood 7/17 no growth Urine  Other  Subjective : In bed.  Overall reports feeling better. Overnight: Afebrile Review of Systems: Review of Systems  All other systems reviewed and are negative.    Scheduled Meds:  amLODipine   10 mg Oral q morning   apixaban   5 mg Oral BID   carvedilol   12.5 mg Oral BID WC   Chlorhexidine  Gluconate Cloth  6 each Topical Daily   DULoxetine   60 mg Oral Daily   feeding supplement  237 mL Oral BID BM   pantoprazole   40 mg Oral BID   rosuvastatin   10 mg Oral Daily   sodium chloride  flush  10-40 mL Intracatheter Q12H   temazepam   30 mg Oral QHS   Continuous Infusions:  cefTRIAXone  (ROCEPHIN )  IV 2 g (03/16/24 0840)   DAPTOmycin  700 mg (03/16/24 1302)   PRN Meds:.acetaminophen  **OR** acetaminophen , ALPRAZolam , ondansetron , sodium chloride  flush Allergies  Allergen Reactions   Adhesive [Tape] Other (See Comments)    Tears the skin    Penicillins Hives and Other (See Comments)    PATIENT HAS TOLERATED Cephalosporins    OBJECTIVE: Vitals:   03/16/24 0400 03/16/24 0733 03/16/24 0832  03/16/24 1144  BP: 112/70 128/80 128/80 105/72  Pulse: 80 89 89 82  Resp: 18     Temp: 98.5 F (36.9 C) 98.9 F (37.2 C)  98.5 F (36.9 C)  TempSrc: Oral Oral    SpO2: 98% 95%  97%  Weight:      Height:       Body mass index is 45.88 kg/m.  Physical Exam Constitutional:      Appearance: Normal appearance.  HENT:     Head: Normocephalic and atraumatic.     Right Ear: Tympanic membrane normal.     Left Ear: Tympanic membrane normal.     Nose: Nose normal.     Mouth/Throat:     Mouth: Mucous membranes are moist.  Eyes:     Extraocular Movements: Extraocular movements intact.     Conjunctiva/sclera: Conjunctivae normal.     Pupils: Pupils are equal, round, and reactive to light.  Cardiovascular:     Rate and Rhythm: Normal rate and regular rhythm.     Heart sounds: No murmur heard.    No friction rub. No gallop.  Pulmonary:     Effort: Pulmonary effort is normal.     Breath sounds: Normal breath sounds.  Abdominal:     General: Abdomen is flat.     Palpations: Abdomen is soft.  Musculoskeletal:        General: Normal range of motion.  Skin:    General: Skin is warm and dry.  Neurological:     General: No focal deficit present.     Mental Status: She is alert and oriented to person, place, and time.  Psychiatric:        Mood and Affect: Mood normal.       Lab Results Lab Results  Component Value Date   WBC 10.6 (H) 03/16/2024   HGB 10.1 (L) 03/16/2024   HCT 27.9 (L) 03/16/2024   MCV 76.0 (L) 03/16/2024   PLT 275 03/16/2024    Lab Results  Component Value Date   CREATININE 0.80 03/16/2024   BUN <5 (L) 03/16/2024   NA 134 (L) 03/16/2024   K 3.5 03/16/2024   CL 103 03/16/2024   CO2 21 (L) 03/16/2024    Lab Results  Component Value Date   ALT 37 03/15/2024   AST 57 (H) 03/15/2024   ALKPHOS 65 03/15/2024   BILITOT 1.0 03/15/2024        Loney Stank, MD [facility] 03/16/2024, 2:19  PM  Evaluation of this patient requires complex antimicrobial therapy evaluation and counseling + isolation needs for disease transmission risk assessment and mitigation

## 2024-03-16 NOTE — Plan of Care (Signed)
  Problem: Education: Goal: Knowledge of General Education information will improve Description: Including pain rating scale, medication(s)/side effects and non-pharmacologic comfort measures Outcome: Progressing   Problem: Health Behavior/Discharge Planning: Goal: Ability to manage health-related needs will improve Outcome: Progressing   Problem: Coping: Goal: Level of anxiety will decrease Outcome: Progressing   Problem: Elimination: Goal: Will not experience complications related to urinary retention Outcome: Progressing

## 2024-03-17 DIAGNOSIS — R112 Nausea with vomiting, unspecified: Secondary | ICD-10-CM | POA: Diagnosis not present

## 2024-03-17 LAB — COMPREHENSIVE METABOLIC PANEL WITH GFR
ALT: 33 U/L (ref 0–44)
AST: 37 U/L (ref 15–41)
Albumin: 2 g/dL — ABNORMAL LOW (ref 3.5–5.0)
Alkaline Phosphatase: 94 U/L (ref 38–126)
Anion gap: 11 (ref 5–15)
BUN: 5 mg/dL — ABNORMAL LOW (ref 8–23)
CO2: 19 mmol/L — ABNORMAL LOW (ref 22–32)
Calcium: 7.6 mg/dL — ABNORMAL LOW (ref 8.9–10.3)
Chloride: 102 mmol/L (ref 98–111)
Creatinine, Ser: 0.91 mg/dL (ref 0.44–1.00)
GFR, Estimated: >60 mL/min (ref >60)
Glucose, Bld: 81 mg/dL (ref 70–99)
Potassium: 3.8 mmol/L (ref 3.5–5.1)
Sodium: 132 mmol/L — ABNORMAL LOW (ref 135–145)
Total Bilirubin: 1.3 mg/dL — ABNORMAL HIGH (ref 0.0–1.2)
Total Protein: 5.1 g/dL — ABNORMAL LOW (ref 6.5–8.1)

## 2024-03-17 LAB — CULTURE, BLOOD (ROUTINE X 2)
Culture: NO GROWTH
Culture: NO GROWTH

## 2024-03-17 LAB — CBC
HCT: 29 % — ABNORMAL LOW (ref 36.0–46.0)
Hemoglobin: 10.3 g/dL — ABNORMAL LOW (ref 12.0–15.0)
MCH: 27.6 pg (ref 26.0–34.0)
MCHC: 35.5 g/dL (ref 30.0–36.0)
MCV: 77.7 fL — ABNORMAL LOW (ref 80.0–100.0)
Platelets: 262 K/uL (ref 150–400)
RBC: 3.73 MIL/uL — ABNORMAL LOW (ref 3.87–5.11)
RDW: 16.7 % — ABNORMAL HIGH (ref 11.5–15.5)
WBC: 12 K/uL — ABNORMAL HIGH (ref 4.0–10.5)
nRBC: 0 % (ref 0.0–0.2)

## 2024-03-17 LAB — MAGNESIUM: Magnesium: 1.5 mg/dL — ABNORMAL LOW (ref 1.7–2.4)

## 2024-03-17 MED ORDER — DAPTOMYCIN IV (FOR PTA / DISCHARGE USE ONLY): 700.0000 mg | INTRAVENOUS | 0 refills | Status: DC

## 2024-03-17 MED ORDER — CEFTRIAXONE IV (FOR PTA / DISCHARGE USE ONLY): 2.0000 g | INTRAVENOUS | 0 refills | Status: DC

## 2024-03-17 NOTE — Discharge Instructions (Signed)
 Follow with Jefferey Fitch, MD in 5-7 days  Please get a complete blood count and chemistry panel checked by your Primary MD at your next visit, and again as instructed by your Primary MD. Please get your medications reviewed and adjusted by your Primary MD.  Please request your Primary MD to go over all Hospital Tests and Procedure/Radiological results at the follow up, please get all Hospital records sent to your Prim MD by signing hospital release before you go home.  In some cases, there will be blood work, cultures and biopsy results pending at the time of your discharge. Please request that your primary care M.D. goes through all the records of your hospital data and follows up on these results.  If you had Pneumonia of Lung problems at the Hospital: Please get a 2 view Chest X ray done in 6-8 weeks after hospital discharge or sooner if instructed by your Primary MD.  If you have Congestive Heart Failure: Please call your Cardiologist or Primary MD anytime you have any of the following symptoms:  1) 3 pound weight gain in 24 hours or 5 pounds in 1 week  2) shortness of breath, with or without a dry hacking cough  3) swelling in the hands, feet or stomach  4) if you have to sleep on extra pillows at night in order to breathe  Follow cardiac low salt diet and 1.5 lit/day fluid restriction.  If you have diabetes Accuchecks 4 times/day, Once in AM empty stomach and then before each meal. Log in all results and show them to your primary doctor at your next visit. If any glucose reading is under 80 or above 300 call your primary MD immediately.  If you have Seizure/Convulsions/Epilepsy: Please do not drive, operate heavy machinery, participate in activities at heights or participate in high speed sports until you have seen by Primary MD or a Neurologist and advised to do so again. Per [place]  DMV statutes, patients with seizures are not allowed to drive until they have been  seizure-free for six months.  Use caution when using heavy equipment or power tools. Avoid working on ladders or at heights. Take showers instead of baths. Ensure the water  temperature is not too high on the home water  heater. Do not go swimming alone. Do not lock yourself in a room alone (i.e. bathroom). When caring for infants or small children, sit down when holding, feeding, or changing them to minimize risk of injury to the child in the event you have a seizure. Maintain good sleep hygiene. Avoid alcohol .   If you had Gastrointestinal Bleeding: Please ask your Primary MD to check a complete blood count within one week of discharge or at your next visit. Your endoscopic/colonoscopic biopsies that are pending at the time of discharge, will also need to followed by your Primary MD.  Get Medicines reviewed and adjusted. Please take all your medications with you for your next visit with your Primary MD  Please request your Primary MD to go over all hospital tests and procedure/radiological results at the follow up, please ask your Primary MD to get all Hospital records sent to his/her office.  If you experience worsening of your admission symptoms, develop shortness of breath, life threatening emergency, suicidal or homicidal thoughts you must seek medical attention immediately by calling 911 or calling your MD immediately  if symptoms less severe.  You must read complete instructions/literature along with all the possible adverse reactions/side effects for all the Medicines you take  and that have been prescribed to you. Take any new Medicines after you have completely understood and accpet all the possible adverse reactions/side effects.   Do not drive or operate heavy machinery when taking Pain medications.   Do not take more than prescribed Pain, Sleep and Anxiety Medications  Special Instructions: If you have smoked or chewed Tobacco  in the last 2 yrs please stop smoking, stop any regular  Alcohol   and or any Recreational drug use.  Wear Seat belts while driving.  Please note You were cared for by a hospitalist during your hospital stay. If you have any questions about your discharge medications or the care you received while you were in the hospital after you are discharged, you can call the unit and asked to speak with the hospitalist on call if the hospitalist that took care of you is not available. Once you are discharged, your primary care physician will handle any further medical issues. Please note that NO REFILLS for any discharge medications will be authorized once you are discharged, as it is imperative that you return to your primary care physician (or establish a relationship with a primary care physician if you do not have one) for your aftercare needs so that they can reassess your need for medications and monitor your lab values.  You can reach the hospitalist office at phone [phone] or fax [phone]   If you do not have a primary care physician, you can call [phone] for a physician referral.  Activity: As tolerated with Full fall precautions use walker/cane & assistance as needed    Diet: regular  Disposition Home

## 2024-03-17 NOTE — Evaluation (Signed)
 Physical Therapy Evaluation Patient Details Name: [name] MRN: [id number] DOB: Jan 02, 1951 Today's Date: 03/17/2024  History of Present Illness  Pt is a 73 y.o. female admitted 7/17 with intractable nausea and vomiting. PMH: breat CA on chemo, PE, CHF, HTN, anxiety, abscess of left iliac fossa  Clinical Impression  Pt admitted with above diagnosis. PTA pt lived at home with husband, mod I at [facility] vs w/c level. Pt currently with functional limitations due to the deficits listed below (see PT Problem List). On eval, pt required supervision supine to sit, min assist sit to supine, supervision sit to stand, and CGA amb 30' with RW. Good sitting balance and poor standing balance. Pt will benefit from acute skilled PT to increase their independence and safety with mobility to allow discharge. Upon d/c, recommend resuming HHPT.         If plan is discharge home, recommend the following: A little help with walking and/or transfers;A little help with bathing/dressing/bathroom;Assistance with cooking/housework;Assist for transportation;Help with stairs or ramp for entrance   Can travel by private vehicle        Equipment Recommendations None recommended by PT  Recommendations for Other Services       Functional Status Assessment Patient has had a recent decline in their functional status and demonstrates the ability to make significant improvements in function in a reasonable and predictable amount of time.     Precautions / Restrictions Precautions Precautions: Fall Recall of Precautions/Restrictions: Impaired Restrictions LLE Weight Bearing Per Provider Order: Weight bearing as tolerated      Mobility  Bed Mobility Overal bed mobility: Needs Assistance Bed Mobility: Supine to Sit, Sit to Supine     Supine to sit: Supervision, HOB elevated, Used rails Sit to supine: Min assist, Used rails   General bed mobility comments: assist with BLE back to bed    Transfers Overall  transfer level: Needs assistance Equipment used: Rolling walker (2 wheels) Transfers: Sit to/from Stand Sit to Stand: Supervision           General transfer comment: increased time    Ambulation/Gait Ambulation/Gait assistance: Contact guard assist Gait Distance (Feet): 30 Feet Assistive device: Rolling walker (2 wheels) Gait Pattern/deviations: Step-through pattern, Decreased stride length Gait velocity: decreased Gait velocity interpretation: <1.8 ft/sec, indicate of risk for recurrent falls   General Gait Details: slow, steady gait with RW  Stairs            Wheelchair Mobility     Tilt Bed    Modified Rankin (Stroke Patients Only)       Balance Overall balance assessment: History of Falls, Needs assistance Sitting-balance support: No upper extremity supported, Feet supported Sitting balance-Leahy Scale: Good     Standing balance support: During functional activity, Bilateral upper extremity supported, Reliant on assistive device for balance Standing balance-Leahy Scale: Poor                               Pertinent Vitals/Pain Pain Assessment Pain Assessment: No/denies pain    Home Living Family/patient expects to be discharged to:: Private residence Living Arrangements: Spouse/significant other Available Help at Discharge: Family;Available 24 hours/day Type of Home: House Home Access: Stairs to enter;Ramped entrance Entrance Stairs-Rails: None Entrance Stairs-Number of Steps: 3   Home Layout: One level Home Equipment: Rollator (4 wheels);Rolling Walker (2 wheels);Wheelchair - manual;Wheelchair - power;Grab bars - tub/shower;Grab bars - toilet;Hand held shower head;Tub bench;BSC/3in1;Cane - single point;Other (comment)  Prior Function Prior Level of Function : Independent/Modified Independent;History of Falls (last six months)             Mobility Comments: mod I with w/c vs RW ADLs Comments: mod I     Extremity/Trunk  Assessment   Upper Extremity Assessment Upper Extremity Assessment: Generalized weakness    Lower Extremity Assessment Lower Extremity Assessment: Generalized weakness    Cervical / Trunk Assessment Cervical / Trunk Assessment: Kyphotic  Communication   Communication Communication: No apparent difficulties    Cognition Arousal: Alert Behavior During Therapy: WFL for tasks assessed/performed   PT - Cognitive impairments: Safety/Judgement, Problem solving                       PT - Cognition Comments: slow processing Following commands: Impaired Following commands impaired: Follows multi-step commands inconsistently     Cueing Cueing Techniques: Verbal cues, Visual cues     General Comments General comments (skin integrity, edema, etc.): VSS on RA    Exercises     Assessment/Plan    PT Assessment Patient needs continued PT services  PT Problem List Decreased strength;Decreased activity tolerance;Decreased balance;Decreased mobility;Decreased safety awareness       PT Treatment Interventions DME instruction;Functional mobility training;Therapeutic activities;Therapeutic exercise;Balance training;Patient/family education;Wheelchair mobility training;Manual techniques;Modalities    PT Goals (Current goals can be found in the Care Plan section)  Acute Rehab PT Goals Patient Stated Goal: home PT Goal Formulation: With patient Time For Goal Achievement: 03/31/24 Potential to Achieve Goals: Good    Frequency Min 2X/week     Co-evaluation               AM-PAC PT 6 Clicks Mobility  Outcome Measure Help needed turning from your back to your side while in a flat bed without using bedrails?: A Little Help needed moving from lying on your back to sitting on the side of a flat bed without using bedrails?: A Little Help needed moving to and from a bed to a chair (including a wheelchair)?: A Little Help needed standing up from a chair using your arms  (e.g., wheelchair or bedside chair)?: A Little Help needed to walk in hospital room?: A Little Help needed climbing 3-5 steps with a railing? : Total 6 Click Score: 16    End of Session Equipment Utilized During Treatment: Gait belt Activity Tolerance: Patient tolerated treatment well Patient left: in bed;with call bell/phone within reach Nurse Communication: Mobility status PT Visit Diagnosis: Muscle weakness (generalized) (M62.81);History of falling (Z91.81)    Time: 9251-9192 PT Time Calculation (min) (ACUTE ONLY): 19 min   Charges:   PT Evaluation $PT Eval Low Complexity: 1 Low   PT General Charges $$ ACUTE PT VISIT: 1 Visit         Sari MATSU., PT  Office # [phone]   Erven Sari Shaker 03/17/2024, 8:43 AM

## 2024-03-17 NOTE — TOC Transition Note (Signed)
 Transition of Care [facility]) - Discharge Note   Patient Details  Name: [name] MRN: [id number] Date of Birth: 02/06/1951  [facility] CM/SW Contact:  Rosaline JONELLE Joe, RN Phone Number: 03/17/2024, 12:00 PM   Clinical Narrative:    CM spoke with MD and patient is scheduled to discharge to home today.  CM called and spoke with Holley Herring, RNCM with [employer] and she is aware that patient is discharging today and will arrange to have patient's re-newed antibiotics delivered to the home.  Patient will need her 1400 dose of Daptomycin  IV before she is discharged and patient/primary RN are aware.  Patient will need to have right chest port-a-cath accessed when she returns home and RN is aware.  Patient was receiving IV anitbiotics through port when she arrived to the hospital for this admission and does not need additional teaching prior to discharge to home today.  [facility] was notified that patient is discharging home today after 1400 pm.  HH orders are in place.  Patient plans to have granddaughter assist husband with transportation to home by car today per patient.  No other IP Care management needs and bedside nursing will discharge the patient home after 1400 today.         Patient Goals and CMS Choice            Discharge Placement                       Discharge Plan and Services Additional resources added to the After Visit Summary for                                       Social Drivers of Health (SDOH) Interventions SDOH Screenings   Food Insecurity: No Food Insecurity (03/13/2024)  Housing: Low Risk  (03/13/2024)  Transportation Needs: No Transportation Needs (03/13/2024)  Utilities: Not At Risk (03/13/2024)  Social Connections: Moderately Integrated (03/13/2024)  Recent Concern: Social Connections - Moderately Isolated (02/14/2024)  Tobacco Use: Low Risk  (03/12/2024)     Readmission Risk Interventions    03/16/2024    4:38 PM  02/14/2024   12:50 PM 01/21/2024    1:47 PM  Readmission Risk Prevention Plan  Transportation Screening Complete Complete Complete  PCP or Specialist Appt within 3-5 Days   Complete  HRI or Home Care Consult   Complete  Social Work Consult for Recovery Care Planning/Counseling   Complete  Palliative Care Screening   Not Applicable  Medication Review [name]) Complete Complete Complete  PCP or Specialist appointment within 3-5 days of discharge Complete Complete   HRI or Home Care Consult Complete Complete   SW Recovery Care/Counseling Consult Not Complete    Palliative Care Screening Not Applicable    Skilled Nursing Facility Not Applicable

## 2024-03-17 NOTE — Discharge Summary (Signed)
 Physician Discharge Summary  [name] FMW:[id number] DOB: October 10, 1950 DOA: 03/12/2024  PCP: Jefferey Fitch, MD  Admit date: 03/12/2024 Discharge date: 03/17/2024  Admitted From: home Disposition:  home  Recommendations for Outpatient Follow-up:  Follow up with PCP in 1-2 weeks Please obtain BMP/CBC in one week  Home Health: none Equipment/Devices: none  Discharge Condition: stable CODE STATUS: Full code Diet Orders (From admission, onward)     Start     Ordered   03/15/24 0925  Diet regular Fluid consistency: Thin  Diet effective now       Question:  Fluid consistency:  Answer:  Thin   03/15/24 0924            Brief Narrative / Interim history: 73 y.o. female with medical history significant of metastatic breast cancer on trastuzumab , chronic HFpEF, history of PE on Eliquis , hypertension, hyperlipidemia, anxiety, class III obesity.  Recent hospital admission 6/19-6/26 for septic arthritis of sacroiliac joint, sacral osteomyelitis, left iliacus abscess with myositis, group B strep bacteremia, E. coli UTI.  Patient underwent CT-guided aspiration of left iliacus abscess.  Discharged home on daptomycin  and ceftriaxone  via port x 6 weeks (end date 8/4) per ID recommendation.  She now comes back to the hospital with nausea, vomiting, diarrhea and generalized abdominal pain for couple of days, unable to tolerate any p.o. intake.  She has been having chills.  In the ED she was found to have leukocytosis with white count of 16.7, she was tachycardic, and a CT scan of the abdomen and pelvis showed diffuse circumferential wall thickening of the esophagus, and he also showed ongoing septic arthritis and increased bony resorption and sclerosis in the left sacroiliac joint.  Hospital Course / Discharge diagnoses: Principal Problem:   Intractable nausea and vomiting Active Problems:   Hypokalemia   Septic arthritis (HCC)   Esophagitis   Urinary frequency   Diarrhea   SIRS  (systemic inflammatory response syndrome) (HCC)   Principal problem Intractable nausea, vomiting, concern for esophagitis -patient had an episode few months ago where she was admitted with hematemesis, EGD was done at that point which showed normal esophagus, congestive gastropathy so overall fairly unremarkable.  Imaging on admission was concerning for circumferential wall thickening of the esophagus, and GI was consulted.  Given recent negative EGD, improvement in her symptoms, it was felt like this is related to her nausea and vomiting rather than a new pathology.  She may have had a nonspecific gastroenteritis episode that caused her current symptoms.  With supportive care she is back to baseline, tolerating a regular diet, GI signed off and she will be discharged home in stable condition   Active problems Acute metabolic encephalopathy -new onset 7/20 morning, unclear cause.  She has not received any medications that could cause this, she is on Cymbalta  that she is at home, also Xanax  and Restoril  are home medications.  The only new medication that she has been getting here is Zofran  which is very unlikely to cause confusion.  Ammonia, ABG, MRI of the brain with and without contrast were all fairly unremarkable.  Encephalopathy resolved, she is back to baseline, alert and oriented x 4.  Do suspect may have been hospital related Septic arthritis of the left sacroiliac joint-appears that she is progressing some, ID consulted, no other new recommendations at this point other than continue her home medications Urinary frequency-UA fairly unremarkable  Diarrhea-CT scan fairly unremarkable without any significant evidence of colitis.  C. difficile and GI pathogen panel  both negative.  The area resolved Metastatic breast cancer-on immunotherapy, outpatient follow-up.  MRI of the brain without metastatic disease History of prior PE-continue Eliquis  Anemia of chronic disease-hemoglobin stable, no  bleeding Hypokalemia, hypomagnesemia, hypophosphatemia-electrolytes overall better. Troponin elevation-flat, not in a pattern consistent with ACS, likely demand ischemia.  No chest pain, continue statin Essential hypertension-it appears that her losartan  and spironolactone  were already on hold as an outpatient, continue to hold since she is normotensive, follow-up with PCP regarding further management  Sepsis ruled out   Discharge Instructions   Allergies as of 03/17/2024       Reactions   Adhesive [tape] Other (See Comments)   Tears the skin   Penicillins Hives, Other (See Comments)   PATIENT HAS TOLERATED Cephalosporins        Medication List     PAUSE taking these medications    losartan  100 MG tablet Wait to take this until your doctor or other care provider tells you to start again. Follow up with your PCP for a blood pressure check and repeat labs before restarting your losartan  Commonly known as: COZAAR  Take 100 mg by mouth every morning.   spironolactone  25 MG tablet Wait to take this until your doctor or other care provider tells you to start again. Follow up with your PCP to determine whether to resume this medicine based on repeat labs and repeat blood pressure checks Commonly known as: ALDACTONE  Take 1 tablet daily       TAKE these medications    acetaminophen  500 MG tablet Commonly known as: TYLENOL  Take 500-1,000 mg by mouth every 6 (six) hours as needed for mild pain (pain score 1-3), fever or moderate pain (pain score 4-6).   albuterol  108 (90 Base) MCG/ACT inhaler Commonly known as: VENTOLIN  HFA Inhale 2 puffs into the lungs every 6 (six) hours as needed for wheezing.   ALPRAZolam  1 MG tablet Commonly known as: XANAX  Take 1 tablet (1 mg total) by mouth 3 (three) times daily as needed for anxiety.   amLODipine  10 MG tablet Commonly known as: NORVASC  Take 1 tablet (10 mg total) by mouth every morning.   apixaban  5 MG Tabs tablet Commonly  known as: Eliquis  Take 1 tablet (5 mg total) by mouth 2 (two) times daily.   CALCIUM  PO Take 2 tablets by mouth daily.   carvedilol  12.5 MG tablet Commonly known as: COREG  Take 1 tablet (12.5 mg total) by mouth 2 (two) times daily with a meal.   cefTRIAXone  IVPB Commonly known as: ROCEPHIN  Inject 2 g into the vein daily. Indication:  L-iliac fossa abscess First Dose: Yes Last Day of Therapy:  03/30/24 Labs - Once weekly:  CBC/D and BMP, Labs - Once weekly- ESR and CRP Method of administration: IV Push Method of administration may be changed at the discretion of home infusion pharmacist based upon assessment of the patient and/or caregiver's ability to self-administer the medication ordered. What changed: additional instructions   cyclobenzaprine  5 MG tablet Commonly known as: FLEXERIL  Take 1 tablet (5 mg total) by mouth 2 (two) times daily as needed for muscle spasms.   daptomycin  IVPB Commonly known as: CUBICIN  Inject 700 mg into the vein daily. Indication:  L-iliac fossa abscess First Dose: Yes Last Day of Therapy:  03/30/24 Labs - Once weekly:  CBC/D, BMP, and CPK Labs - Once weekly- ESR and CRP Method of administration: IV Push Method of administration may be changed at the discretion of home infusion pharmacist based upon assessment of the  patient and/or caregiver's ability to self-administer the medication ordered. What changed: additional instructions   DULoxetine  60 MG capsule Commonly known as: CYMBALTA  Take 60 mg by mouth daily.   furosemide  40 MG tablet Commonly known as: LASIX  Take 40 mg by mouth as needed for fluid or edema.   gabapentin  300 MG capsule Commonly known as: NEURONTIN  TAKE 2 CAPSULES BY MOUTH THREE TIMES DAILY What changed:  when to take this reasons to take this additional instructions   ondansetron  8 MG tablet Commonly known as: ZOFRAN  Take 1 tablet (8 mg total) by mouth every 8 (eight) hours as needed for nausea or vomiting.   Oxycodone   HCl 10 MG Tabs Take 10 mg by mouth every 6 (six) hours as needed (Pain).   pantoprazole  40 MG tablet Commonly known as: Protonix  Take 1 tablet (40 mg total) by mouth daily.   predniSONE 5 MG tablet Commonly known as: DELTASONE Take 5 mg by mouth daily as needed (inflammation).   rosuvastatin  10 MG tablet Commonly known as: Crestor  Take 1 tablet (10 mg total) by mouth daily.   temazepam  30 MG capsule Commonly known as: RESTORIL  Take 1 capsule (30 mg total) by mouth at bedtime.        Follow-up Information     [facility] Follow up.   Why: [facility] will provide IV antibiotics for home.        [facility]  Follow up.   Why: [facility] services for PT/RN.  They will call you to restart services in the next 24-48 hours. Contact information: [address] [place] [address] [phone]                Consultations: Gastroenterology Infectious disease  Procedures/Studies:  MR BRAIN W WO CONTRAST Result Date: 03/16/2024 CLINICAL DATA:  73 year old female with unexplained altered mental status. Breast cancer. EXAM: MRI HEAD WITHOUT AND WITH CONTRAST TECHNIQUE: Multiplanar, multiecho pulse sequences of the brain and surrounding structures were obtained without and with intravenous contrast. CONTRAST:  10mL GADAVIST  GADOBUTROL  1 MMOL/ML IV SOLN COMPARISON:  Brain MRI without and with contrast 02/12/2024 FINDINGS: Brain: Stable cerebral volume from last month. No restricted diffusion to suggest acute infarction. No midline shift, mass effect, evidence of mass lesion, ventriculomegaly, extra-axial collection or acute intracranial hemorrhage. Cervicomedullary junction and pituitary are within normal limits. Inadvertently, pre contrast axial T1 weighted imaging was not obtained. But precontrast sagittal T1 was. No abnormal enhancement identified. No abnormal dural thickening or enhancement identified. Mild for  age patchy and scattered cerebral white matter T2 and FLAIR hyperintensity in the cerebral hemispheres is stable. More moderately advanced T2 and FLAIR heterogeneity throughout the pons is stable. No chronic cerebral blood products on SWI. No cortical encephalomalacia identified. Vascular: Major intracranial vascular flow voids are preserved. Skull and upper cervical spine: Negative visible cervical spine and spinal cord. Visualized bone marrow signal is within normal limits. Sinuses/Orbits: Stable and negative. Other: Mastoids remain well aerated. Visible internal auditory structures appear normal. Suggestion of small but progressed bilateral upper cervical lymphadenopathy on coronal diffusion and sagittal images, nonspecific. IMPRESSION: 1. No acute or metastatic abnormality of the brain identified. 2. Evidence of increased small bilateral upper cervical lymph nodes since last month, nonspecific, but more resembles infectious or inflammatory rather than metastatic nodes. Electronically Signed   By: VEAR Hurst M.D.   On: 03/16/2024 10:31   CT CHEST ABDOMEN PELVIS W CONTRAST Result Date: 03/12/2024 CLINICAL DATA:  Sepsis, abdominal  pain, metastatic breast cancer * Tracking Code: BO * EXAM: CT CHEST, ABDOMEN, AND PELVIS WITH CONTRAST TECHNIQUE: Multidetector CT imaging of the chest, abdomen and pelvis was performed following the standard protocol during bolus administration of intravenous contrast. RADIATION DOSE REDUCTION: This exam was performed according to the departmental dose-optimization program which includes automated exposure control, adjustment of the mA and/or kV according to patient size and/or use of iterative reconstruction technique. CONTRAST:  75mL OMNIPAQUE  IOHEXOL  350 MG/ML SOLN COMPARISON:  MR left hip, 02/12/2024, CT chest abdomen pelvis, 11/14/2023 FINDINGS: CT CHEST FINDINGS Cardiovascular: Right chest port catheter. Normal heart size. No pericardial effusion. Mediastinum/Nodes: No enlarged  mediastinal, hilar, or axillary lymph nodes. Diffuse circumferential wall thickening of the esophagus (series 3, image 31, series 7, image 75). Thyroid and trachea without significant findings. Lungs/Pleura: New trace right pleural effusion. Subpleural radiation fibrosis of the anterior right lung. Musculoskeletal: Status post bilateral mastectomy and bilateral axillary lymph node dissection. No acute osseous findings. Partially callused subacute fractures of the anterolateral right ribs (series 5, image 62). Partially callused, subacute to chronic nondisplaced fracture of the sternal body (series 7, image 73). CT ABDOMEN PELVIS FINDINGS Hepatobiliary: No solid liver abnormality. Focal fatty deposition adjacent to the falciform ligament, characteristic in appearance and location. Tiny cyst of the anterior liver (series 3, image 48). Status post cholecystectomy. Mild postoperative biliary ductal dilatation. Pancreas: Unremarkable. No pancreatic ductal dilatation or surrounding inflammatory changes. Spleen: Normal in size without significant abnormality. Adrenals/Urinary Tract: Adrenal glands are unremarkable. Simple, benign right renal cortical cysts for which no further follow-up or characterization is required. Kidneys are otherwise normal, without renal calculi, solid lesion, or hydronephrosis. Bladder is unremarkable. Stomach/Bowel: Stomach is within normal limits. Appendix not clearly visualized. No evidence of bowel wall thickening, distention, or inflammatory changes. Vascular/Lymphatic: No significant vascular findings are present. No enlarged abdominal or pelvic lymph nodes. Reproductive: Calcified uterine fibroids. Other: No abdominal wall hernia or abnormality. No ascites. Musculoskeletal: Interval increase in bony resorption and sclerosis about the left sacroiliac joint (series 3, image 92). IMPRESSION: 1. Diffuse circumferential wall thickening of the esophagus, consistent with nonspecific infectious or  inflammatory esophagitis. 2. New trace right pleural effusion. 3. Interval increase in bony resorption and sclerosis about the left sacroiliac joint, concerning for ongoing septic arthritis. 4. Status post bilateral mastectomy and bilateral axillary lymph node dissection. No evidence of lymphadenopathy or metastatic disease in the chest, abdomen, or pelvis. Electronically Signed   By: Marolyn JONETTA Jaksch M.D.   On: 03/12/2024 21:31   DG Chest 2 View Result Date: 03/12/2024 EXAM: 2 VIEW(S) XRAY OF THE CHEST 03/12/2024 05:15:05 PM COMPARISON: None available. CLINICAL HISTORY: A 73 y.o. female was evaluated in triage. Pt complains of fatigue. Report having abd pain, n/v/d since yesterday. Hx of breast CA getting chemo. Also had L hip osteo on IV abx x 6 weeks. FINDINGS: LUNGS AND PLEURA: Bibasilar atelectasis or infiltrates. Round nodule or opacity projecting over the right hilum may be the pulmonary artery, seen en face; lymphadenopathy or pulmonary nodule are not excluded. Consider CT for further evaluation. No pleural effusion or pneumothorax. HEART AND MEDIASTINUM: No acute abnormality of the cardiac and mediastinal silhouettes. BONES AND SOFT TISSUES: No acute osseous abnormality. LINES AND TUBES: Right chest wall port-a-cath with tip in the right atrium. IMPRESSION: 1. Round nodule or opacity projecting over the right hilum. Differential includes pulmonary artery, lymphadenopathy, or pulmonary nodule. Consider CT for further evaluation. 2. Bibasilar atelectasis or infiltrates. Electronically signed by: Norman Gatlin MD  03/12/2024 05:23 PM EDT RP Workstation: [id number]   ECHOCARDIOGRAM COMPLETE Result Date: 02/18/2024    ECHOCARDIOGRAM REPORT   Patient Name:   MITTIE KNITTEL Date of Exam: 02/18/2024 Medical Rec #:  [id number]       Height:       63.0 in Accession #:    [id number]      Weight:       225.0 lb Date of Birth:  1951/03/22       BSA:          2.033 m Patient Age:    73 years        BP:           123/47  mmHg Patient Gender: F               HR:           79 bpm. Exam Location:  Inpatient Procedure: 2D Echo, Strain Analysis, Cardiac Doppler and Color Doppler (Both            Spectral and Color Flow Doppler were utilized during procedure). Indications:    bacteremia  History:        Patient has prior history of Echocardiogram examinations, most                 recent 11/12/2023.  Sonographer:    Therisa Crouch Referring Phys: [id number] [facility] IMPRESSIONS  1. Left ventricular ejection fraction, by estimation, is 70 to 75%. The left ventricle has hyperdynamic function. The left ventricle has no regional wall motion abnormalities. There is mild concentric left ventricular hypertrophy. Left ventricular diastolic parameters were normal. The average left ventricular global longitudinal strain is -20.2 %. The global longitudinal strain is normal.  2. Right ventricular systolic function is normal. The right ventricular size is normal.  3. The mitral valve is normal in structure. No evidence of mitral valve regurgitation. No evidence of mitral stenosis.  4. The aortic valve was not well visualized. Aortic valve regurgitation is not visualized. No aortic stenosis is present. Comparison(s): Prior images reviewed side by side. Function is more vigorous and strain shows more, appropriate deformation despite incomplete assessment of the septum. Conclusion(s)/Recommendation(s): No evidence of valvular vegetations on this transthoracic echocardiogram. Consider a transesophageal echocardiogram to exclude infective endocarditis if clinically indicated. FINDINGS  Left Ventricle: Left ventricular ejection fraction, by estimation, is 70 to 75%. The left ventricle has hyperdynamic function. The left ventricle has no regional wall motion abnormalities. The average left ventricular global longitudinal strain is -20.2  %. Strain was performed and the global longitudinal strain is normal. The left ventricular internal cavity size was  normal in size. There is mild concentric left ventricular hypertrophy. Left ventricular diastolic parameters were normal. Right Ventricle: The right ventricular size is normal. No increase in right ventricular wall thickness. Right ventricular systolic function is normal. Left Atrium: Left atrial size was normal in size. Right Atrium: Right atrial size was normal in size. Pericardium: There is no evidence of pericardial effusion. Mitral Valve: The mitral valve is normal in structure. No evidence of mitral valve regurgitation. No evidence of mitral valve stenosis. Tricuspid Valve: The tricuspid valve is normal in structure. Tricuspid valve regurgitation is not demonstrated. No evidence of tricuspid stenosis. Aortic Valve: The aortic valve was not well visualized. Aortic valve regurgitation is not visualized. No aortic stenosis is present. Aortic valve mean gradient measures 6.0 mmHg. Aortic valve peak gradient measures 10.9 mmHg. Aortic valve area, by VTI measures 2.22 cm. Pulmonic  Valve: The pulmonic valve was normal in structure. Pulmonic valve regurgitation is not visualized. No evidence of pulmonic stenosis. Aorta: The aortic root and ascending aorta are structurally normal, with no evidence of dilitation. IAS/Shunts: The atrial septum is grossly normal.  LEFT VENTRICLE PLAX 2D LVIDd:         4.50 cm   Diastology LVIDs:         3.00 cm   LV e' medial:    11.10 cm/s LV PW:         1.10 cm   LV E/e' medial:  7.5 LV IVS:        1.10 cm   LV e' lateral:   12.80 cm/s LVOT diam:     2.00 cm   LV E/e' lateral: 6.5 LV SV:         74 LV SV Index:   36        2D Longitudinal Strain LVOT Area:     3.14 cm  2D Strain GLS Avg:     -20.2 %  RIGHT VENTRICLE             IVC RV Basal diam:  3.90 cm     IVC diam: 2.10 cm RV S prime:     12.70 cm/s TAPSE (M-mode): 2.5 cm LEFT ATRIUM             Index LA diam:        3.40 cm 1.67 cm/m LA Vol (A2C):   53.6 ml 26.37 ml/m LA Vol (A4C):   55.7 ml 27.40 ml/m LA Biplane Vol: 56.8 ml  27.94 ml/m  AORTIC VALVE AV Area (Vmax):    2.09 cm AV Area (Vmean):   2.28 cm AV Area (VTI):     2.22 cm AV Vmax:           165.00 cm/s AV Vmean:          114.000 cm/s AV VTI:            0.331 m AV Peak Grad:      10.9 mmHg AV Mean Grad:      6.0 mmHg LVOT Vmax:         110.00 cm/s LVOT Vmean:        82.700 cm/s LVOT VTI:          0.234 m LVOT/AV VTI ratio: 0.71  AORTA Ao Root diam: 3.00 cm Ao Asc diam:  3.40 cm MITRAL VALVE MV Area (PHT): 4.06 cm    SHUNTS MV Decel Time: 187 msec    Systemic VTI:  0.23 m MV E velocity: 83.10 cm/s  Systemic Diam: 2.00 cm MV A velocity: 66.00 cm/s MV E/A ratio:  1.26 Stanly Leavens MD Electronically signed by Stanly Leavens MD Signature Date/Time: 02/18/2024/9:09:52 AM    Final      Subjective: - no chest pain, shortness of breath, no abdominal pain, nausea or vomiting.   Discharge Exam: BP 123/75 (BP Location: Left Wrist)   Pulse 99   Temp 99.5 F (37.5 C)   Resp 19   Ht 5' 3 (1.6 m)   Wt 117.5 kg   SpO2 96%   BMI 45.88 kg/m   General: Pt is alert, awake, not in acute distress Cardiovascular: RRR, S1/S2 +, no rubs, no gallops Respiratory: CTA bilaterally, no wheezing, no rhonchi Abdominal: Soft, NT, ND, bowel sounds + Extremities: no edema, no cyanosis    The results of significant diagnostics from this hospitalization (including imaging, microbiology, ancillary and laboratory)  are listed below for reference.     Microbiology: Recent Results (from the past 240 hours)  Blood culture (routine x 2)     Status: None   Collection Time: 03/12/24  4:50 PM   Specimen: BLOOD  Result Value Ref Range Status   Specimen Description BLOOD RIGHT ANTECUBITAL  Final   Special Requests   Final    BOTTLES DRAWN AEROBIC AND ANAEROBIC Blood Culture results may not be optimal due to an inadequate volume of blood received in culture bottles   Culture   Final    NO GROWTH 5 DAYS Performed at [facility], [address] [place], [place]  [address]    Report Status 03/17/2024 FINAL  Final  Blood culture (routine x 2)     Status: None   Collection Time: 03/12/24  4:55 PM   Specimen: BLOOD  Result Value Ref Range Status   Specimen Description BLOOD LEFT ANTECUBITAL  Final   Special Requests   Final    BOTTLES DRAWN AEROBIC AND ANAEROBIC Blood Culture results may not be optimal due to an inadequate volume of blood received in culture bottles   Culture   Final    NO GROWTH 5 DAYS Performed at [facility], [address] [place], [place] [address]    Report Status 03/17/2024 FINAL  Final  C Difficile Quick Screen w PCR reflex     Status: None   Collection Time: 03/13/24 12:05 PM   Specimen: STOOL  Result Value Ref Range Status   C Diff antigen NEGATIVE NEGATIVE Final   C Diff toxin NEGATIVE NEGATIVE Final   C Diff interpretation No C. difficile detected.  Final    Comment: Performed at [facility], [address], [place], [place] [address]  Gastrointestinal Panel by PCR , Stool     Status: None   Collection Time: 03/13/24 12:05 PM   Specimen: STOOL  Result Value Ref Range Status   Campylobacter species NOT DETECTED NOT DETECTED Final   Plesimonas shigelloides NOT DETECTED NOT DETECTED Final   Salmonella species NOT DETECTED NOT DETECTED Final   Yersinia enterocolitica NOT DETECTED NOT DETECTED Final   Vibrio species NOT DETECTED NOT DETECTED Final   Vibrio cholerae NOT DETECTED NOT DETECTED Final   Enteroaggregative E coli (EAEC) NOT DETECTED NOT DETECTED Final   Enteropathogenic E coli (EPEC) NOT DETECTED NOT DETECTED Final   Enterotoxigenic E coli (ETEC) NOT DETECTED NOT DETECTED Final   Shiga like toxin producing E coli (STEC) NOT DETECTED NOT DETECTED Final   Shigella/Enteroinvasive E coli (EIEC) NOT DETECTED NOT DETECTED Final   Cryptosporidium NOT DETECTED NOT DETECTED Final   Cyclospora cayetanensis NOT DETECTED NOT DETECTED Final   Entamoeba histolytica NOT DETECTED NOT DETECTED Final   Giardia  lamblia NOT DETECTED NOT DETECTED Final   Adenovirus F40/41 NOT DETECTED NOT DETECTED Final   Astrovirus NOT DETECTED NOT DETECTED Final   Norovirus GI/GII NOT DETECTED NOT DETECTED Final   Rotavirus A NOT DETECTED NOT DETECTED Final   Sapovirus (I, II, IV, and V) NOT DETECTED NOT DETECTED Final    Comment: Performed at [facility], [address] [place], [place] [address]     Labs: Basic Metabolic Panel: Recent Labs  Lab 03/13/24 0332 03/14/24 0237 03/15/24 0256 03/16/24 0354 03/17/24 0343  NA 138 136 134* 134* 132*  K 2.9* 3.3* 3.2* 3.5 3.8  CL 103 105 101 103 102  CO2 22 21* 20* 21* 19*  GLUCOSE 85 71 91 88 81  BUN 5* 5* 5* <5* 5*  CREATININE 0.93 0.98 0.84 0.80 0.91  CALCIUM  8.0* 7.9* 8.1* 8.0* 7.6*  MG 1.4* 1.7 1.7 1.9 1.5*  PHOS  --  1.7* 2.8  --   --    Liver Function Tests: Recent Labs  Lab 03/12/24 1653 03/13/24 0332 03/14/24 0237 03/15/24 0256 03/17/24 0343  AST 55* 43* 51* 57* 37  ALT 36 30 30 37 33  ALKPHOS 61 53 51 65 94  BILITOT 1.1 1.2 1.2 1.0 1.3*  PROT 7.0 6.0* 5.3* 5.6* 5.1*  ALBUMIN 2.4* 2.1* 1.9* 2.1* 2.0*   CBC: Recent Labs  Lab 03/12/24 1653 03/13/24 0332 03/14/24 0237 03/15/24 0256 03/16/24 0354 03/17/24 0343  WBC 16.7* 13.0* 10.0 10.4 10.6* 12.0*  NEUTROABS 12.0*  --   --   --   --   --   HGB 11.4* 10.0* 9.1* 9.7* 10.1* 10.3*  HCT 32.1* 28.0* 25.8* 27.6* 27.9* 29.0*  MCV 77.5* 77.3* 78.2* 77.3* 76.0* 77.7*  PLT 347 265 263 262 275 262   CBG: No results for input(s): GLUCAP in the last 168 hours. Hgb A1c No results for input(s): HGBA1C in the last 72 hours. Lipid Profile No results for input(s): CHOL, HDL, LDLCALC, TRIG, CHOLHDL, LDLDIRECT in the last 72 hours. Thyroid function studies No results for input(s): TSH, T4TOTAL, T3FREE, THYROIDAB in the last 72 hours.  Invalid input(s): FREET3 Urinalysis    Component Value Date/Time   COLORURINE AMBER (A) 03/14/2024 1719   APPEARANCEUR  HAZY (A) 03/14/2024 1719   LABSPEC 1.038 (H) 03/14/2024 1719   LABSPEC 1.020 10/28/2014 1142   PHURINE 6.0 03/14/2024 1719   GLUCOSEU NEGATIVE 03/14/2024 1719   GLUCOSEU Negative 10/28/2014 1142   HGBUR SMALL (A) 03/14/2024 1719   BILIRUBINUR MODERATE (A) 03/14/2024 1719   BILIRUBINUR Negative 10/28/2014 1142   KETONESUR 20 (A) 03/14/2024 1719   PROTEINUR 100 (A) 03/14/2024 1719   UROBILINOGEN 0.2 10/28/2014 1142   NITRITE NEGATIVE 03/14/2024 1719   LEUKOCYTESUR NEGATIVE 03/14/2024 1719   LEUKOCYTESUR Trace 10/28/2014 1142    FURTHER DISCHARGE INSTRUCTIONS:   Get Medicines reviewed and adjusted: Please take all your medications with you for your next visit with your Primary MD   Laboratory/radiological data: Please request your Primary MD to go over all hospital tests and procedure/radiological results at the follow up, please ask your Primary MD to get all Hospital records sent to his/her office.   In some cases, they will be blood work, cultures and biopsy results pending at the time of your discharge. Please request that your primary care M.D. goes through all the records of your hospital data and follows up on these results.   Also Note the following: If you experience worsening of your admission symptoms, develop shortness of breath, life threatening emergency, suicidal or homicidal thoughts you must seek medical attention immediately by calling 911 or calling your MD immediately  if symptoms less severe.   You must read complete instructions/literature along with all the possible adverse reactions/side effects for all the Medicines you take and that have been prescribed to you. Take any new Medicines after you have completely understood and accpet all the possible adverse reactions/side effects.    Do not drive when taking Pain medications or sleeping medications (Benzodaizepines)   Do not take more than prescribed Pain, Sleep and Anxiety Medications. It is not advisable to  combine anxiety,sleep and pain medications without talking with your primary care practitioner  Special Instructions: If you have smoked or chewed Tobacco  in the last 2 yrs please stop smoking, stop any regular Alcohol   and or any Recreational drug use.   Wear Seat belts while driving.   Please note: You were cared for by a hospitalist during your hospital stay. Once you are discharged, your primary care physician will handle any further medical issues. Please note that NO REFILLS for any discharge medications will be authorized once you are discharged, as it is imperative that you return to your primary care physician (or establish a relationship with a primary care physician if you do not have one) for your post hospital discharge needs so that they can reassess your need for medications and monitor your lab values.  Time coordinating discharge: 35 minutes  SIGNED:  Nilda Fendt, MD, PhD 03/17/2024, 11:06 AM

## 2024-03-17 NOTE — Plan of Care (Signed)

## 2024-03-17 NOTE — Progress Notes (Signed)
 Patient does not need to be reloaded for C18D1. Keep 750mg  flat dose per Dr. Gudena.  Dede Dobesh, PharmD, MBA

## 2024-03-18 ENCOUNTER — Telehealth: Payer: Self-pay

## 2024-03-18 NOTE — Transitions of Care (Post Inpatient/ED Visit) (Signed)
   03/18/2024  Name: [name] MRN: [id number] DOB: 1951-08-21  Today's TOC FU Call Status: Today's TOC FU Call Status:: Unsuccessful Call (1st Attempt) Unsuccessful Call (1st Attempt) Date: 03/18/24  Attempted to reach the patient regarding the most recent Inpatient/ED visit.  Follow Up Plan: Additional outreach attempts will be made to reach the patient to complete the Transitions of Care (Post Inpatient/ED visit) call.    Bing Edison MSN, RN RN Case Sales executive Health  VBCI-Population Health Office Hours M-F [phone] Direct Dial: [phone] Main Phone [phone]  Fax: [phone] [email or web address]

## 2024-03-19 ENCOUNTER — Telehealth: Payer: Self-pay

## 2024-03-19 DIAGNOSIS — M4628 Osteomyelitis of vertebra, sacral and sacrococcygeal region: Secondary | ICD-10-CM | POA: Diagnosis not present

## 2024-03-19 DIAGNOSIS — I110 Hypertensive heart disease with heart failure: Secondary | ICD-10-CM | POA: Diagnosis not present

## 2024-03-19 DIAGNOSIS — I5032 Chronic diastolic (congestive) heart failure: Secondary | ICD-10-CM | POA: Diagnosis not present

## 2024-03-19 DIAGNOSIS — B951 Streptococcus, group B, as the cause of diseases classified elsewhere: Secondary | ICD-10-CM | POA: Diagnosis not present

## 2024-03-19 DIAGNOSIS — M4658 Other infective spondylopathies, sacral and sacrococcygeal region: Secondary | ICD-10-CM | POA: Diagnosis not present

## 2024-03-19 DIAGNOSIS — K3533 Acute appendicitis with perforation and localized peritonitis, with abscess: Secondary | ICD-10-CM | POA: Diagnosis not present

## 2024-03-19 NOTE — Telephone Encounter (Signed)
 Received call from [facility] RN with [facility] regarding concerns for possible allergic reaction. RN states pt is complaining of rash on back, abdomen, arms, and neck. Rash is itchy and has been going on for 5 days. Also notes scratchy throat. Pt did not contact anyone regarding symptoms. RN denies pt having SOB, but did note some facial swelling.  Advised patient she should go to ED for allergic reaction. Per MD pt can stop antibiotics for now.  PT did state she is not able to go to ED due to transportation. Will try to come in tomorrow for evaluation.  Understands if symptoms get worse overnight or notices swelling/ SOB to go to ED or contact EMS.  Verbalized understanding. RN updated. Will need to contact [facility] with antibiotics orders.  Lorenda CHRISTELLA Code, RMA

## 2024-03-19 NOTE — Patient Instructions (Signed)
 Visit Information  Thank you for taking time to visit with me today. Please don't hesitate to contact me if I can be of assistance to you before our next scheduled telephone appointment.  Our next appointment is by telephone on 03/27/24  at 3pm  Following is a copy of your care plan:   Goals Addressed             This Visit's Progress    VBCI Transitions of Care (TOC) Care Plan       Problems:  Recent Hospitalization for treatment of Intractable nausea and vomiting/esophagitis/hip pain due to sacroiliac fossa infection Home Health services barrier: Home infusion IV antibiotic therapy to start today, did not start 03/17/24,  No Hospital Follow Up Provider appointment Patient will call today to make HFU appointment after RN from home health comes to start service.    Goal:  Over the next 30 days, the patient will not experience hospital readmission  Interventions:  Transitions of Care: Doctor Visits  - discussed the importance of doctor visits Level of Care education provided and will reassess next call.  Reviewed/discussed/provided information or education on all aspects of initial outreach TOC post discharge follow up: Assessments:  Pain today: Level 4 without medication; tolerable and better than before admission.  Nausea: Better since discharge; Patient stated medication Zofran  does not manage the nausea well.  Patient alert and oriented, able to complete call.  No noticeable shortness of breath, patient denies shortness of breath, chest pain, edema, or other system issues on ROS.  Reviewed all follow up appointments  Discussed importance of PCP HFU appointment within 14 days of discharge Functional status/ADL's assessed per patient stated.  Allergies reviewed: Rash noted by patient over arms, legs, face and head:  Denies swelling of lips, tongue, throat, face, eyes--just itchy but not worse than when it started around the day of discharge.  Will have home health RN assess  today on visit or call PCP.  Medication review and updated.  IV antibiotics infusions have not yet been started per patient. She states home health RN coming this afternoon to start.  IV access site is, patient stated, clean and dry, with no issues or leakage reported.  IV Access to be flushed per medication review, as infusions not started flushes unlikely since discharge.   Patient Self Care Activities:  Attend all scheduled provider appointments Call pharmacy for medication refills 3-7 days in advance of running out of medications Call provider office for new concerns or questions  Notify RN Care Manager of TOC call rescheduling needs Participate in Transition of Care Program/Attend TOC scheduled calls Perform all self care activities independently  Take medications as prescribed   History of prior PE-continue Eliquis   Contact PCP after home health RN visit today to schedule Hospital follow up visit (HFU) Resume daily weights, and blood pressure monitoring Follow self care for heat failure and per DC instructions.  Check with PCP on dietary orders on HFU, maintain low Na till confirmed.   Plan:  An initial telephone outreach has been scheduled for: Next week 03/27/24 at 3 pm.  Follow up with provider re: Hospital follow up appointment Complete all follow up appointments Contact [facility] if home health IV infusions are not started today, patient has contact number, for call [facility] RN CM for assistance: Direct Dial: [phone]  Bing Edison MSN, RN RN Case Manager [facility]  VBCI-Population Health Office Hours M-F [phone] Direct Dial: [phone] Main Phone [phone]  Fax: [phone] [email or web address]  Patient verbalizes understanding of instructions and care plan provided today and agrees to view in MyChart. Active MyChart status and patient understanding of how to access instructions and care plan via MyChart confirmed with patient.     An initial telephone  outreach has been scheduled for: Our next appointment is by telephone on 03/27/24  at 3pm   Please call the care guide team at [phone] if you need to cancel or reschedule your appointment.   Please call the [place]  National Suicide Prevention Lifeline: (272)884-4894 or TTY: 513-316-0355 TTY 347-377-0300) to talk to a trained counselor call 1-800-273-TALK (toll free, 24 hour hotline) call 911 if you are experiencing a Mental Health or Behavioral Health Crisis or need someone to talk to.   Bing Edison MSN, RN RN Case Sales executive Health  VBCI-Population Health Office Hours M-F [phone] Direct Dial: [phone] Main Phone [phone]  Fax: [phone] [email or web address]

## 2024-03-19 NOTE — Transitions of Care (Post Inpatient/ED Visit) (Signed)
 Today's TOC FU Call Status: Today's TOC FU Call Status:: Successful TOC FU Call Completed Unsuccessful Call (1st Attempt) Date: 03/18/24 [facility] FU Call Complete Date: 03/19/24 Patient's Name and Date of Birth confirmed.  Background:  Background: Taken from DC Summary 73 y.o. female with medical history significant of metastatic breast cancer on trastuzumab , chronic HFpEF, history of PE on Eliquis , hypertension, hyperlipidemia, anxiety, class III obesity.  Recent hospital admission 6/19-6/26 for septic arthritis of sacroiliac joint, sacral osteomyelitis, left iliacus abscess with myositis, group B strep bacteremia, E. coli UTI.  Patient underwent CT-guided aspiration of left iliacus abscess.   Discharged home on daptomycin  and ceftriaxone  via port x 6 weeks (end date 8/4) per ID recommendation.  She now comes back to the hospital with nausea, vomiting, diarrhea and generalized abdominal pain for couple of days, unable to tolerate any p.o. intake.  She has been having chills.  In the ED she was found to have leukocytosis with white count of 16.7, she was tachycardic, and a CT scan of the abdomen and pelvis showed diffuse circumferential wall thickening of the esophagus, and he also showed ongoing septic arthritis and increased bony resorption and sclerosis in the left sacroiliac joint.    Transition Care Management Follow-up Telephone Call Date of Discharge: 03/17/24 Discharge Facility: [facility]) Type of Discharge: Inpatient Admission Primary Inpatient Discharge Diagnosis:: Intractable nausae and vomiting How have you been since you were released from the hospital?: Better Any questions or concerns?: No  Items Reviewed: Medications obtained,verified, and reconciled?: Yes (Medications Reviewed) (Patient has not yet had home IV antibiotics started since discharge, stated that RN is coming today.) Any new allergies since your discharge?: No (Patient reports a mild itchy rash over arms, legs,  face, and head, to report to RN coming out today to assess. Feels it may have started around time of discharge but has not gotten any worse. No swelling around face, lips, tongue, throat reported when ?.) Dietary orders reviewed?: Yes Type of Diet Ordered:: DC Summary stated regular diet but patient has been on low NA prior to this and will verfiy with PCP for HFU. Do you have support at home?: Yes People in Home [RPT]: child(ren), adult, spouse Name of Support/Comfort Primary Source: [name] (Daughter)  [phone] (Mobile)  Medications Reviewed Today: Medications Reviewed Today     Reviewed by Carolee Heron NOVAK, RN (Case Manager) on 03/19/24 at 1338  Med List Status: <None>   Medication Order Taking? Sig Documenting Provider Last Dose Status Informant  acetaminophen  (TYLENOL ) 500 MG tablet [id number] Yes Take 500-1,000 mg by mouth every 6 (six) hours as needed for mild pain (pain score 1-3), fever or moderate pain (pain score 4-6). [provider]  Active Self, Pharmacy Records  albuterol  (VENTOLIN  HFA) [phone] Base) MCG/ACT inhaler [id number] Yes Inhale 2 puffs into the lungs every 6 (six) hours as needed for wheezing. Gudena, Vinay, MD  Active Self, Pharmacy Records  ALPRAZolam  (XANAX ) 1 MG tablet [id number] Yes Take 1 tablet (1 mg total) by mouth 3 (three) times daily as needed for anxiety. Gudena, Vinay, MD  Active Self, Pharmacy Records  amLODipine  (NORVASC ) 10 MG tablet [id number] Yes Take 1 tablet (10 mg total) by mouth every morning. Magrinat, Sandria BROCKS, MD  Active Self, Pharmacy Records  apixaban  (ELIQUIS ) 5 MG TABS tablet [id number] Yes Take 1 tablet (5 mg total) by mouth 2 (two) times daily. Gudena, Vinay, MD  Active Self, Pharmacy Records  Med Note (WHITE, DONETA RAMAN   Fri Mar 13, 2024  2:48 AM)    CALCIUM  PO [id number] Yes Take 2 tablets by mouth daily. [provider]  Active Self, Pharmacy Records  carvedilol  (COREG ) 12.5 MG tablet [id number] Yes Take 1  tablet (12.5 mg total) by mouth 2 (two) times daily with a meal. Perri DELENA Meliton Mickey., MD  Active Self, Pharmacy Records  cefTRIAXone  (ROCEPHIN ) IVPB [id number]  Inject 2 g into the vein daily. Indication:  L-iliac fossa abscess First Dose: Yes Last Day of Therapy:  03/30/24 Labs - Once weekly:  CBC/D and BMP, Labs - Once weekly- ESR and CRP Method of administration: IV Push Method of administration may be changed at the discretion of home infusion pharmacist based upon assessment of the patient and/or caregiver's ability to self-administer the medication ordered. Gherghe, Costin M, MD  Active   cyclobenzaprine  (FLEXERIL ) 5 MG tablet [id number] Yes Take 1 tablet (5 mg total) by mouth 2 (two) times daily as needed for muscle spasms. Gudena, Vinay, MD  Active Self, Pharmacy Records  daptomycin  (CUBICIN ) IVPB [id number]  Inject 700 mg into the vein daily. Indication:  L-iliac fossa abscess First Dose: Yes Last Day of Therapy:  03/30/24 Labs - Once weekly:  CBC/D, BMP, and CPK Labs - Once weekly- ESR and CRP Method of administration: IV Push Method of administration may be changed at the discretion of home infusion pharmacist based upon assessment of the patient and/or caregiver's ability to self-administer the medication ordered. Gherghe, Costin M, MD  Active   DULoxetine  (CYMBALTA ) 60 MG capsule [id number] Yes Take 60 mg by mouth daily. [provider]  Active Self, Pharmacy Records  furosemide  (LASIX ) 40 MG tablet [id number] Yes Take 40 mg by mouth as needed for fluid or edema. [provider]  Active Self, Pharmacy Records           Med Note JACKOLYN WADDELL DEL   Fri Feb 14, 2024  7:37 AM)    gabapentin  (NEURONTIN ) 300 MG capsule [id number] Yes TAKE 2 CAPSULES BY MOUTH THREE TIMES DAILY Odean Potts, MD  Active Self, Pharmacy Records           Med Note [facility] [place], [place] A   Thu Feb 13, 2024  2:45 PM)    losartan  (COZAAR ) 100 MG tablet [id number]  Take 100 mg by mouth every  morning. [provider]  Active Self, Pharmacy Records  ondansetron  (ZOFRAN ) 8 MG tablet [id number] Yes Take 1 tablet (8 mg total) by mouth every 8 (eight) hours as needed for nausea or vomiting. Laurence Locus, DO  Active Self, Pharmacy Records  Oxycodone  HCl 10 MG TABS [id number] Yes Take 10 mg by mouth every 6 (six) hours as needed (Pain). [provider]  Active Self, Pharmacy Records  pantoprazole  (PROTONIX ) 40 MG tablet [id number]  Take 1 tablet (40 mg total) by mouth daily.  Patient not taking: Reported on 02/14/2024   Laurence Locus, DO  Expired 02/20/24 2359 Self, Pharmacy Records  predniSONE (DELTASONE) 5 MG tablet [id number] Yes Take 5 mg by mouth daily as needed (inflammation). [provider]  Active Self, Pharmacy Records  rosuvastatin  (CRESTOR ) 10 MG tablet [id number] Yes Take 1 tablet (10 mg total) by mouth daily. Rolan Ezra RAMAN, MD  Active Self, Pharmacy Records  sodium chloride  flush (NS) 0.9 % injection 10 mL [id number]   Magrinat, Sandria BROCKS, MD  Active   sodium chloride  flush (NS) 0.9 % injection 10 mL [id number]   Magrinat, Sandria BROCKS,  MD  Active   sodium chloride  flush (NS) 0.9 % injection 10 mL [id number]   Magrinat, Sandria BROCKS, MD  Active   spironolactone  (ALDACTONE ) 25 MG tablet [id number]  Take 1 tablet daily  Patient not taking: Reported on 03/13/2024   Rolan Ezra RAMAN, MD  Active Self, Pharmacy Records  temazepam  (RESTORIL ) 30 MG capsule [id number] Yes Take 1 capsule (30 mg total) by mouth at bedtime. Crawford Morna Pickle, NP  Active Self, Pharmacy Records            Home Care and Equipment/Supplies: Were Home Health Services Ordered?: Yes Name of Home Health Agency:: [facility] Has Agency set up a time to come to your home?: Yes First Home Health Visit Date: 03/19/24 Any new equipment or medical supplies ordered?: No  Functional Questionnaire: Do you need assistance with bathing/showering or dressing?: No Do you need assistance with  meal preparation?: No Do you need assistance with eating?: No Do you have difficulty maintaining continence: No Do you need assistance with getting out of bed/getting out of a chair/moving?: No Do you have difficulty managing or taking your medications?: No  Follow up appointments reviewed: PCP Follow-up appointment confirmed?: No MD Provider Line Number:[phone] Given: No Specialist Hospital Follow-up appointment confirmed?: Yes Date of Specialist follow-up appointment?: 03/23/24 Follow-Up Specialty Provider:: Infectious Disease Provider Do you need transportation to your follow-up appointment?: No Do you understand care options if your condition(s) worsen?: Yes-patient verbalized understanding  SDOH Interventions Today    Flowsheet Row Most Recent Value  SDOH Interventions   Food Insecurity Interventions Intervention Not Indicated  Housing Interventions Intervention Not Indicated  Transportation Interventions Intervention Not Indicated, Patient Resources (Friends/Family)  Utilities Interventions Intervention Not Indicated  Depression Interventions/Treatment  Medication  [Related to illness and rehospitalization patient stated, will reassess next visit.]    Goals Addressed             This Visit's Progress    VBCI Transitions of Care (TOC) Care Plan       Problems:  Recent Hospitalization for treatment of Intractable nausea and vomiting/esophagitis/hip pain due to sacroiliac fossa infection Home Health services barrier: Home infusion IV antibiotic therapy to start today, did not start 03/17/24,  No Hospital Follow Up Provider appointment Patient will call today to make HFU appointment after RN from home health comes to start service.    Goal:  Over the next 30 days, the patient will not experience hospital readmission  Interventions:  Transitions of Care: Doctor Visits  - discussed the importance of doctor visits Level of Care education provided and will reassess next  call.  Reviewed/discussed/provided information or education on all aspects of initial outreach TOC post discharge follow up: Assessments:  Pain today: Level 4 without medication; tolerable and better than before admission.  Nausea: Better since discharge; Patient stated medication Zofran  does not manage the nausea well.  Patient alert and oriented, able to complete call.  No noticeable shortness of breath, patient denies shortness of breath, chest pain, edema, or other system issues on ROS.  Reviewed all follow up appointments  Discussed importance of PCP HFU appointment within 14 days of discharge Functional status/ADL's assessed per patient stated.  Allergies reviewed: Rash noted by patient over arms, legs, face and head:  Denies swelling of lips, tongue, throat, face, eyes--just itchy but not worse than when it started around the day of discharge.  Will have home health RN assess today on visit or call PCP.  Medication review and updated.  IV antibiotics infusions have not yet been started per patient. She states home health RN coming this afternoon to start.  IV access site is, patient stated, clean and dry, with no issues or leakage reported.  IV Access to be flushed per medication review, as infusions not started flushes unlikely since discharge.   Patient Self Care Activities:  Attend all scheduled provider appointments Call pharmacy for medication refills 3-7 days in advance of running out of medications Call provider office for new concerns or questions  Notify RN Care Manager of TOC call rescheduling needs Participate in Transition of Care Program/Attend TOC scheduled calls Perform all self care activities independently  Take medications as prescribed   History of prior PE-continue Eliquis   Contact PCP after home health RN visit today to schedule Hospital follow up visit (HFU) Resume daily weights, and blood pressure monitoring Follow self care for heat failure and per DC  instructions.  Check with PCP on dietary orders on HFU, maintain low Na till confirmed.   Plan:  An initial telephone outreach has been scheduled for: Next week 03/27/24 at 3 pm.  Follow up with provider re: Hospital follow up appointment Complete all follow up appointments Contact [employer] if home health IV infusions are not started today, patient has contact number, for call [facility] RN CM for assistance: Direct Dial: [phone]  Bing Edison MSN, RN RN Case Manager [facility]  VBCI-Population Health Office Hours M-F [phone] Direct Dial: [phone] Main Phone [phone]  Fax: [phone] [email or web address]          03/19/24: Successful TOC post discharge outreach and verbal agreement for follow up calls. Next call 03/27/24 at 3 pm. Patient understands how to contact insurance for additional benefits if need arises or to check on eligibility.   PCP will be faxed a copy of this enrollment documentation in the [facility] 30 day program.    Bing Edison MSN, RN RN Case Manager [facility] Health  VBCI-Population Health Office Hours M-F [phone] Direct Dial: [phone] Main Phone [phone]  Fax: [phone] .com

## 2024-03-20 ENCOUNTER — Ambulatory Visit: Admitting: Infectious Diseases

## 2024-03-20 ENCOUNTER — Telehealth: Payer: Self-pay

## 2024-03-20 ENCOUNTER — Other Ambulatory Visit: Payer: Self-pay

## 2024-03-20 ENCOUNTER — Encounter: Payer: Self-pay | Admitting: Infectious Diseases

## 2024-03-20 VITALS — HR 110 | Temp 97.6°F

## 2024-03-20 DIAGNOSIS — M86151 Other acute osteomyelitis, right femur: Secondary | ICD-10-CM | POA: Diagnosis not present

## 2024-03-20 DIAGNOSIS — R7881 Bacteremia: Secondary | ICD-10-CM | POA: Diagnosis not present

## 2024-03-20 DIAGNOSIS — Z95828 Presence of other vascular implants and grafts: Secondary | ICD-10-CM

## 2024-03-20 DIAGNOSIS — M4658 Other infective spondylopathies, sacral and sacrococcygeal region: Secondary | ICD-10-CM

## 2024-03-20 DIAGNOSIS — B951 Streptococcus, group B, as the cause of diseases classified elsewhere: Secondary | ICD-10-CM

## 2024-03-20 DIAGNOSIS — Z452 Encounter for adjustment and management of vascular access device: Secondary | ICD-10-CM | POA: Insufficient documentation

## 2024-03-20 DIAGNOSIS — T50905A Adverse effect of unspecified drugs, medicaments and biological substances, initial encounter: Secondary | ICD-10-CM | POA: Insufficient documentation

## 2024-03-20 DIAGNOSIS — L270 Generalized skin eruption due to drugs and medicaments taken internally: Secondary | ICD-10-CM | POA: Insufficient documentation

## 2024-03-20 DIAGNOSIS — Z79899 Other long term (current) drug therapy: Secondary | ICD-10-CM | POA: Insufficient documentation

## 2024-03-20 DIAGNOSIS — R21 Rash and other nonspecific skin eruption: Secondary | ICD-10-CM | POA: Insufficient documentation

## 2024-03-20 NOTE — Telephone Encounter (Signed)
 Per Dr. Dea patient needs to stop IV ceftriaxone  continue IV daptomycin  and start ertapenem 1g with a end date of 8/5. Sent a message to [employer] about changes as well

## 2024-03-20 NOTE — Progress Notes (Addendum)
 Patient Active Problem List   Diagnosis Date Noted   Intractable nausea and vomiting 03/13/2024   Esophagitis 03/13/2024   Urinary frequency 03/13/2024   Diarrhea 03/13/2024   SIRS (systemic inflammatory response syndrome) (HCC) 03/13/2024   Hyperlipidemia 02/18/2024   Bacteremia due to group B Streptococcus 02/17/2024   Septic arthritis (HCC) 02/14/2024   Osteomyelitis (HCC) 02/14/2024   Abscess of left iliac fossa 02/13/2024   Hypomagnesemia 01/20/2024   Hematemesis 01/19/2024   History of pulmonary embolism 01/19/2024   Chronic anticoagulation 01/19/2024   DNR (do not resuscitate).DNI(Do Not Intubate) 01/19/2024   Hypokalemia 01/19/2024   Acute metabolic acidosis 01/19/2024   Goals of care, counseling/discussion 08/13/2019   Port-A-Cath in place 08/14/2018   Obesity, Class III, BMI 40-49.9 (morbid obesity) 09/22/2015   Malignant neoplasm of both breasts in female, estrogen receptor negative (HCC) 09/22/2015   Coagulopathy (HCC) 09/22/2015   Peripheral neuropathy 09/22/2015   Hot flashes 03/24/2015   Right shoulder pain 06/12/2013   Neck pain 06/12/2013   Anemia of chronic disease 03/21/2013   Breast cancer metastasized to multiple sites (HCC) 02/26/2013   Chronic diastolic CHF (congestive heart failure) (HCC) 09/11/2012   Neuropathy 09/05/2012   Essential hypertension 09/05/2012   Anxiety 09/05/2012    Patient's Medications  New Prescriptions   No medications on file  Previous Medications   ACETAMINOPHEN  (TYLENOL ) 500 MG TABLET    Take 500-1,000 mg by mouth every 6 (six) hours as needed for mild pain (pain score 1-3), fever or moderate pain (pain score 4-6).   ALBUTEROL  (VENTOLIN  HFA) 108 (90 BASE) MCG/ACT INHALER    Inhale 2 puffs into the lungs every 6 (six) hours as needed for wheezing.   ALPRAZOLAM  (XANAX ) 1 MG TABLET    Take 1 tablet (1 mg total) by mouth 3 (three) times daily as needed for anxiety.   AMLODIPINE  (NORVASC ) 10 MG TABLET    Take 1 tablet (10 mg  total) by mouth every morning.   APIXABAN  (ELIQUIS ) 5 MG TABS TABLET    Take 1 tablet (5 mg total) by mouth 2 (two) times daily.   CALCIUM  PO    Take 2 tablets by mouth daily.   CARVEDILOL  (COREG ) 12.5 MG TABLET    Take 1 tablet (12.5 mg total) by mouth 2 (two) times daily with a meal.   CEFTRIAXONE  (ROCEPHIN ) IVPB    Inject 2 g into the vein daily. Indication:  L-iliac fossa abscess First Dose: Yes Last Day of Therapy:  03/30/24 Labs - Once weekly:  CBC/D and BMP, Labs - Once weekly- ESR and CRP Method of administration: IV Push Method of administration may be changed at the discretion of home infusion pharmacist based upon assessment of the patient and/or caregiver's ability to self-administer the medication ordered.   CYCLOBENZAPRINE  (FLEXERIL ) 5 MG TABLET    Take 1 tablet (5 mg total) by mouth 2 (two) times daily as needed for muscle spasms.   DAPTOMYCIN  (CUBICIN ) IVPB    Inject 700 mg into the vein daily. Indication:  L-iliac fossa abscess First Dose: Yes Last Day of Therapy:  03/30/24 Labs - Once weekly:  CBC/D, BMP, and CPK Labs - Once weekly- ESR and CRP Method of administration: IV Push Method of administration may be changed at the discretion of home infusion pharmacist based upon assessment of the patient and/or caregiver's ability to self-administer the medication ordered.   DULOXETINE  (CYMBALTA ) 60 MG CAPSULE    Take 60 mg by mouth daily.   FUROSEMIDE  (  LASIX ) 40 MG TABLET    Take 40 mg by mouth as needed for fluid or edema.   GABAPENTIN  (NEURONTIN ) 300 MG CAPSULE    TAKE 2 CAPSULES BY MOUTH THREE TIMES DAILY   LOSARTAN  (COZAAR ) 100 MG TABLET    Take 100 mg by mouth every morning.   ONDANSETRON  (ZOFRAN ) 8 MG TABLET    Take 1 tablet (8 mg total) by mouth every 8 (eight) hours as needed for nausea or vomiting.   OXYCODONE  HCL 10 MG TABS    Take 10 mg by mouth every 6 (six) hours as needed (Pain).   PANTOPRAZOLE  (PROTONIX ) 40 MG TABLET    Take 1 tablet (40 mg total) by mouth daily.    PREDNISONE (DELTASONE) 5 MG TABLET    Take 5 mg by mouth daily as needed (inflammation).   ROSUVASTATIN  (CRESTOR ) 10 MG TABLET    Take 1 tablet (10 mg total) by mouth daily.   SPIRONOLACTONE  (ALDACTONE ) 25 MG TABLET    Take 1 tablet daily   TEMAZEPAM  (RESTORIL ) 30 MG CAPSULE    Take 1 capsule (30 mg total) by mouth at bedtime.  Modified Medications   No medications on file  Discontinued Medications   No medications on file    Subjective: Discussed the use of AI scribe software for clinical note transcription with the patient, who gave verbal consent to proceed.   Addendum: 73 year old female with prior history of metastatic breast cancer on Herceptin  maintenance treatment, SVC syndrome, CHF, HTN, PE on AC, anxiety, recent strep bacteremia/left SI joint septic arthritis who is here for HFU after recent admission 7/18-7/22 for possible acute gastroenteritis   Readmitted 7/18-7/22 for nausea, vomiting, abdominal pain, decreased po intake and diarrhea. This was thought to be be acute gastroenteritis. C diff and GI PCR negative. She was discharged on 7/22 after clinical improvement. She had encephalopathy during admission that also improved with negative work up including negative MRI brain.   CT showed  1. Diffuse circumferential wall thickening of the esophagus, consistent with nonspecific infectious or inflammatory esophagitis. 2. New trace right pleural effusion. 3. Interval increase in bony resorption and sclerosis about the left sacroiliac joint, concerning for ongoing septic arthritis. 4. Status post bilateral mastectomy and bilateral axillary lymph node dissection. No evidence of lymphadenopathy or metastatic disease in the chest, abdomen, or pelvis.  Seen by ID and imaging findings were thought to be lag of radiographic findings and recommended to continue original plan of daptomycin  and ceftriaxone  through 8/4.   7/25 She is accompanied by her grandson. She reports having severe  itching and rashes on her extremities, upper extremities, rt lower leg, back four weeks ago  while on antibiotics. She has a known allergy to penicillin, which previously caused similar symptoms. Denies lip and facial swelling currently. She has not used any antihistamines. She also denies reporting the reaction to anyone until yesterday. I recommended to discontinue ceftriaxone  yesterday. She also reports experiencing diarrhea and nausea after starting on IV antibiotics with one episode of vomiting. However, she currently has no abdominal pain or diarrhea or N/V.   Review of Systems: all systems reviewed with pertinent positives and negatives as listed above   Past Medical History:  Diagnosis Date   Anemia    pt pt report   Anxiety    Breast cancer (HCC)    mets to liver and lung   Breast cancer metastasized to multiple sites (HCC) 02/26/2013   Cellulitis    CHF (congestive heart failure) (HCC)  History of chemotherapy 09/2004   taxotere/herceptin /carboplatin   Hypertension    Neuropathy    Radiation 07/31/2006   left upper chest   Radiation 06/17/2006-06/27/2006   6480 cGy bilat. chest wall   SVC syndrome    Thrombosis    Past Surgical History:  Procedure Laterality Date   ANKLE SURGERY Left    BACK SURGERY     CHOLECYSTECTOMY  08/28/1987   ESOPHAGOGASTRODUODENOSCOPY Left 01/21/2024   Procedure: EGD (ESOPHAGOGASTRODUODENOSCOPY);  Surgeon: Burnette Fallow, MD;  Location: [facility];  Service: Gastroenterology;  Laterality: Left;   MASS EXCISION Left 05/10/2022   Procedure: EXCISION OF LEFT CHEST WALL MASS;  Surgeon: Vanderbilt Ned, MD;  Location: [facility];  Service: General;  Laterality: Left;   MASTECTOMY Bilateral    w/ lymph node removal per patient   PERIPHERALLY INSERTED CENTRAL CATHETER INSERTION     PICC LINE INSERTION     PICC LINE REMOVAL ([facility] HX)     SURGICAL EXCISION OF EXCESSIVE SKIN     TUBAL LIGATION  08/27/1984    Social History   Tobacco Use   Smoking  status: Never    Passive exposure: Never   Smokeless tobacco: Never  Vaping Use   Vaping status: Never Used  Substance Use Topics   Alcohol  use: Yes    Comment: occasional- once a month   Drug use: No    Family History  Problem Relation Age of Onset   Heart failure Father    Cancer Father        Prostate cancer   Heart failure Brother    Cancer Brother        Prostate cancer   Diabetes Maternal Aunt     Allergies  Allergen Reactions   Adhesive [Tape] Other (See Comments)    Tears the skin    Penicillins Hives and Other (See Comments)    PATIENT HAS TOLERATED Cephalosporins    Health Maintenance  Topic Date Due   Diabetic kidney evaluation - Urine ACR  Never done   Hepatitis C Screening  Never done   DTaP/Tdap/Td (1 - Tdap) Never done   Zoster Vaccines- Shingrix (1 of 2) Never done   Colonoscopy  Never done   MAMMOGRAM  10/21/2006   Pneumococcal Vaccine: 50+ Years (2 of 2 - PPSV23, PCV20, or PCV21) 04/03/2017   COVID-19 Vaccine (4 - 2024-25 season) 04/28/2023   Medicare Annual Wellness (AWV)  04/17/2024   INFLUENZA VACCINE  03/27/2024   Diabetic kidney evaluation - eGFR measurement  03/17/2025   DEXA SCAN  Completed   Hepatitis B Vaccines  Aged Out   HPV VACCINES  Aged Out   Meningococcal B Vaccine  Aged Out    Objective: Pulse (!) 110   Temp 97.6 F (36.4 C) (Temporal)   SpO2 98%     Physical Exam Constitutional:      Appearance: Normal appearance.  HENT:     Head: Normocephalic and atraumatic.      Mouth: Mucous membranes are moist.  Eyes:    Conjunctiva/sclera: Conjunctivae normal.     Pupils: Pupils are equal, round, and b/l symmetrical    Cardiovascular:     Rate and Rhythm: Normal rate and regular rhythm.     Heart sounds:   Pulmonary:     Effort: Pulmonary effort is normal.     Breath sounds:   Abdominal:     General: Non distended     Palpations: soft.   Musculoskeletal:  General: sitting in the wheel chair  Skin:     General: Skin is warm and dry. Rt chest portacath OK with no signs of infection     Comments: maculopapular rashes in the b/l forearms, rt leg, rashes in the ? Back seems to have resolved. No facial or lip swelling or SOB. No rashes at other sites       Neurological:     General: grossly non focal     Mental Status: awake, alert and oriented to person, place, and time.   Psychiatric:        Mood and Affect: Mood normal.   Lab Results Lab Results  Component Value Date   WBC 12.0 (H) 03/17/2024   HGB 10.3 (L) 03/17/2024   HCT 29.0 (L) 03/17/2024   MCV 77.7 (L) 03/17/2024   PLT 262 03/17/2024    Lab Results  Component Value Date   CREATININE 0.91 03/17/2024   BUN 5 (L) 03/17/2024   NA 132 (L) 03/17/2024   K 3.8 03/17/2024   CL 102 03/17/2024   CO2 19 (L) 03/17/2024    Lab Results  Component Value Date   ALT 33 03/17/2024   AST 37 03/17/2024   ALKPHOS 94 03/17/2024   BILITOT 1.3 (H) 03/17/2024    Lab Results  Component Value Date   CHOL 235 (H) 11/12/2023   HDL 90 11/12/2023   LDLCALC 134 (H) 11/12/2023   TRIG 55 11/12/2023   CHOLHDL 2.6 11/12/2023   No results found for: LABRPR, RPRTITER HIV 1 RNA Quant (copies/mL)  Date Value  06/19/2014 <20   Microbiology Results for orders placed or performed during the hospital encounter of 03/12/24  Blood culture (routine x 2)     Status: None   Collection Time: 03/12/24  4:50 PM   Specimen: BLOOD  Result Value Ref Range Status   Specimen Description BLOOD RIGHT ANTECUBITAL  Final   Special Requests   Final    BOTTLES DRAWN AEROBIC AND ANAEROBIC Blood Culture results may not be optimal due to an inadequate volume of blood received in culture bottles   Culture   Final    NO GROWTH 5 DAYS Performed at [facility], [address] [place], [place] [address]    Report Status 03/17/2024 FINAL  Final  Blood culture (routine x 2)     Status: None   Collection Time: 03/12/24  4:55 PM   Specimen: BLOOD  Result  Value Ref Range Status   Specimen Description BLOOD LEFT ANTECUBITAL  Final   Special Requests   Final    BOTTLES DRAWN AEROBIC AND ANAEROBIC Blood Culture results may not be optimal due to an inadequate volume of blood received in culture bottles   Culture   Final    NO GROWTH 5 DAYS Performed at [facility], [address], [place], [place] [address]    Report Status 03/17/2024 FINAL  Final  C Difficile Quick Screen w PCR reflex     Status: None   Collection Time: 03/13/24 12:05 PM   Specimen: STOOL  Result Value Ref Range Status   C Diff antigen NEGATIVE NEGATIVE Final   C Diff toxin NEGATIVE NEGATIVE Final   C Diff interpretation No C. difficile detected.  Final    Comment: Performed at [facility], [address] [place], [place] [address]  Gastrointestinal Panel by PCR , Stool     Status: None   Collection Time: 03/13/24 12:05 PM   Specimen:  STOOL  Result Value Ref Range Status   Campylobacter species NOT DETECTED NOT DETECTED Final   Plesimonas shigelloides NOT DETECTED NOT DETECTED Final   Salmonella species NOT DETECTED NOT DETECTED Final   Yersinia enterocolitica NOT DETECTED NOT DETECTED Final   Vibrio species NOT DETECTED NOT DETECTED Final   Vibrio cholerae NOT DETECTED NOT DETECTED Final   Enteroaggregative E coli (EAEC) NOT DETECTED NOT DETECTED Final   Enteropathogenic E coli (EPEC) NOT DETECTED NOT DETECTED Final   Enterotoxigenic E coli (ETEC) NOT DETECTED NOT DETECTED Final   Shiga like toxin producing E coli (STEC) NOT DETECTED NOT DETECTED Final   Shigella/Enteroinvasive E coli (EIEC) NOT DETECTED NOT DETECTED Final   Cryptosporidium NOT DETECTED NOT DETECTED Final   Cyclospora cayetanensis NOT DETECTED NOT DETECTED Final   Entamoeba histolytica NOT DETECTED NOT DETECTED Final   Giardia lamblia NOT DETECTED NOT DETECTED Final   Adenovirus F40/41 NOT DETECTED NOT DETECTED Final   Astrovirus NOT DETECTED NOT DETECTED Final   Norovirus GI/GII NOT  DETECTED NOT DETECTED Final   Rotavirus A NOT DETECTED NOT DETECTED Final   Sapovirus (I, II, IV, and V) NOT DETECTED NOT DETECTED Final    Comment: Performed at [facility], [address] [place], [place] [address]   *Note: Due to a large number of results and/or encounters for the requested time period, some results have not been displayed. A complete set of results can be found in Results Review.   Imaging MR BRAIN W WO CONTRAST Result Date: 03/16/2024 CLINICAL DATA:  73 year old female with unexplained altered mental status. Breast cancer. EXAM: MRI HEAD WITHOUT AND WITH CONTRAST TECHNIQUE: Multiplanar, multiecho pulse sequences of the brain and surrounding structures were obtained without and with intravenous contrast. CONTRAST:  10mL GADAVIST  GADOBUTROL  1 MMOL/ML IV SOLN COMPARISON:  Brain MRI without and with contrast 02/12/2024 FINDINGS: Brain: Stable cerebral volume from last month. No restricted diffusion to suggest acute infarction. No midline shift, mass effect, evidence of mass lesion, ventriculomegaly, extra-axial collection or acute intracranial hemorrhage. Cervicomedullary junction and pituitary are within normal limits. Inadvertently, pre contrast axial T1 weighted imaging was not obtained. But precontrast sagittal T1 was. No abnormal enhancement identified. No abnormal dural thickening or enhancement identified. Mild for age patchy and scattered cerebral white matter T2 and FLAIR hyperintensity in the cerebral hemispheres is stable. More moderately advanced T2 and FLAIR heterogeneity throughout the pons is stable. No chronic cerebral blood products on SWI. No cortical encephalomalacia identified. Vascular: Major intracranial vascular flow voids are preserved. Skull and upper cervical spine: Negative visible cervical spine and spinal cord. Visualized bone marrow signal is within normal limits. Sinuses/Orbits: Stable and negative. Other: Mastoids remain well aerated. Visible  internal auditory structures appear normal. Suggestion of small but progressed bilateral upper cervical lymphadenopathy on coronal diffusion and sagittal images, nonspecific. IMPRESSION: 1. No acute or metastatic abnormality of the brain identified. 2. Evidence of increased small bilateral upper cervical lymph nodes since last month, nonspecific, but more resembles infectious or inflammatory rather than metastatic nodes. Electronically Signed   By: VEAR Hurst M.D.   On: 03/16/2024 10:31   CT CHEST ABDOMEN PELVIS W CONTRAST Result Date: 03/12/2024 CLINICAL DATA:  Sepsis, abdominal pain, metastatic breast cancer * Tracking Code: BO * EXAM: CT CHEST, ABDOMEN, AND PELVIS WITH CONTRAST TECHNIQUE: Multidetector CT imaging of the chest, abdomen and pelvis was performed following the standard protocol during bolus administration of intravenous contrast. RADIATION DOSE REDUCTION: This exam was performed according to the  departmental dose-optimization program which includes automated exposure control, adjustment of the mA and/or kV according to patient size and/or use of iterative reconstruction technique. CONTRAST:  75mL OMNIPAQUE  IOHEXOL  350 MG/ML SOLN COMPARISON:  MR left hip, 02/12/2024, CT chest abdomen pelvis, 11/14/2023 FINDINGS: CT CHEST FINDINGS Cardiovascular: Right chest port catheter. Normal heart size. No pericardial effusion. Mediastinum/Nodes: No enlarged mediastinal, hilar, or axillary lymph nodes. Diffuse circumferential wall thickening of the esophagus (series 3, image 31, series 7, image 75). Thyroid and trachea without significant findings. Lungs/Pleura: New trace right pleural effusion. Subpleural radiation fibrosis of the anterior right lung. Musculoskeletal: Status post bilateral mastectomy and bilateral axillary lymph node dissection. No acute osseous findings. Partially callused subacute fractures of the anterolateral right ribs (series 5, image 62). Partially callused, subacute to chronic  nondisplaced fracture of the sternal body (series 7, image 73). CT ABDOMEN PELVIS FINDINGS Hepatobiliary: No solid liver abnormality. Focal fatty deposition adjacent to the falciform ligament, characteristic in appearance and location. Tiny cyst of the anterior liver (series 3, image 48). Status post cholecystectomy. Mild postoperative biliary ductal dilatation. Pancreas: Unremarkable. No pancreatic ductal dilatation or surrounding inflammatory changes. Spleen: Normal in size without significant abnormality. Adrenals/Urinary Tract: Adrenal glands are unremarkable. Simple, benign right renal cortical cysts for which no further follow-up or characterization is required. Kidneys are otherwise normal, without renal calculi, solid lesion, or hydronephrosis. Bladder is unremarkable. Stomach/Bowel: Stomach is within normal limits. Appendix not clearly visualized. No evidence of bowel wall thickening, distention, or inflammatory changes. Vascular/Lymphatic: No significant vascular findings are present. No enlarged abdominal or pelvic lymph nodes. Reproductive: Calcified uterine fibroids. Other: No abdominal wall hernia or abnormality. No ascites. Musculoskeletal: Interval increase in bony resorption and sclerosis about the left sacroiliac joint (series 3, image 92). IMPRESSION: 1. Diffuse circumferential wall thickening of the esophagus, consistent with nonspecific infectious or inflammatory esophagitis. 2. New trace right pleural effusion. 3. Interval increase in bony resorption and sclerosis about the left sacroiliac joint, concerning for ongoing septic arthritis. 4. Status post bilateral mastectomy and bilateral axillary lymph node dissection. No evidence of lymphadenopathy or metastatic disease in the chest, abdomen, or pelvis. Electronically Signed   By: Marolyn JONETTA Jaksch M.D.   On: 03/12/2024 21:31   DG Chest 2 View Result Date: 03/12/2024 EXAM: 2 VIEW(S) XRAY OF THE CHEST 03/12/2024 05:15:05 PM COMPARISON: None  available. CLINICAL HISTORY: A 73 y.o. female was evaluated in triage. Pt complains of fatigue. Report having abd pain, n/v/d since yesterday. Hx of breast CA getting chemo. Also had L hip osteo on IV abx x 6 weeks. FINDINGS: LUNGS AND PLEURA: Bibasilar atelectasis or infiltrates. Round nodule or opacity projecting over the right hilum may be the pulmonary artery, seen en face; lymphadenopathy or pulmonary nodule are not excluded. Consider CT for further evaluation. No pleural effusion or pneumothorax. HEART AND MEDIASTINUM: No acute abnormality of the cardiac and mediastinal silhouettes. BONES AND SOFT TISSUES: No acute osseous abnormality. LINES AND TUBES: Right chest wall port-a-cath with tip in the right atrium. IMPRESSION: 1. Round nodule or opacity projecting over the right hilum. Differential includes pulmonary artery, lymphadenopathy, or pulmonary nodule. Consider CT for further evaluation. 2. Bibasilar atelectasis or infiltrates. Electronically signed by: Norman Gatlin MD 03/12/2024 05:23 PM EDT RP Workstation: [id number]   Assessment/Plan 73 year old female with prior history of metastatic breast cancer on Herceptin  every 28 days as maintenance, SVC syndrome, CHF, HTN, PE on AC, anxiety admitted with   # Left SI joint septic arthritis and osteomyelitis with associated fluid  collection in the left iliac us , possible abscess - No risk factors like history of surgeries steroid injections or IVDU - Orthopedics consulted and no plans for intervention.   - 6/20 s/p IR aspiration, no organisms in gram stain, NG.   Plan  - continue daptomycin , DC ceftriaxone , start ertapenem  ( see new OPAT) - 7/22 CBC and CMO reviewed discussed. 7/18 CRP 5.3, ESR 45   - fu scheduled on 8/5  # Group B strep 1/4 bottles - blood cx cleared on 7/17 - 6/24 TTE with no vegetations  - antibiotic as above   # Possible reaction to ceftriaxone /Drug rash  - Will DC ceftriaxone  - continue daptomycin , will add ertapenem   ( see new OPAT) - discussed to use benadryl  as needed   I spent 40 minutes involved in face-to-face and non-face-to-face activities for this patient on the day of the visit. Professional time spent includes the following activities: Preparing to see the patient (review of tests), Obtaining and reviewing separately obtained history (discharge record 7/22, notes from ID Dr Dennise), Performing a medically appropriate examination and evaluation , Ordering medications, communicating with other health care professionals [facility] staff, Documenting clinical information in the EMR, Independently interpreting results (not separately reported), Communicating results to the patient, Counseling and educating the patient and Care coordination (not separately reported).   Of note, portions of this note may have been created with voice recognition software. While this note has been edited for accuracy, occasional wrong-word or 'sound-a-like' substitutions may have occurred due to the inherent limitations of voice recognition software.   Annalee Joseph, MD [facility] 03/20/2024, 9:10 AM

## 2024-03-20 NOTE — Progress Notes (Addendum)
 OPAT   Diagnosis: RT hip septic arthritis and osteomyelitis   Culture Result: Group b strep?  Allergies  Allergen Reactions   Adhesive [Tape] Other (See Comments)    Tears the skin    Penicillins Hives and Other (See Comments)    PATIENT HAS TOLERATED Cephalosporins    OPAT Orders Discharge antibiotics to be given via PICC line Discharge antibiotics: daptomycin  700 mg iv daily and ertapenem 1g q daily Per pharmacy protocol  End Date: 03/31/24  [facility] Care Per Protocol:  Home health RN for IV administration and teaching; PICC line care and labs.    Labs weekly while on IV antibiotics: X__ CBC with differential __ BMP X__ CMP X__ CRP X__ ESR __ Vancomycin  trough X__ CK  __ Please pull PIC at completion of IV antibiotics X__ Please leave PIC in place until doctor has seen patient or been notified  Fax weekly labs to [phone]  Clinic Follow Up Appt: 03/31/24

## 2024-03-21 ENCOUNTER — Encounter (HOSPITAL_COMMUNITY): Payer: Self-pay | Admitting: Internal Medicine

## 2024-03-21 ENCOUNTER — Encounter (HOSPITAL_COMMUNITY): Payer: Self-pay

## 2024-03-21 ENCOUNTER — Other Ambulatory Visit: Payer: Self-pay

## 2024-03-21 ENCOUNTER — Inpatient Hospital Stay (HOSPITAL_COMMUNITY)
Admission: EM | Admit: 2024-03-21 | Discharge: 2024-03-29 | DRG: 640 | Disposition: A | Discharge status: Skilled Nursing Facility | Source: Other Acute Inpatient Hospital | Attending: Internal Medicine | Admitting: Internal Medicine

## 2024-03-21 DIAGNOSIS — I4719 Other supraventricular tachycardia: Secondary | ICD-10-CM | POA: Diagnosis not present

## 2024-03-21 DIAGNOSIS — E876 Hypokalemia: Secondary | ICD-10-CM | POA: Diagnosis present

## 2024-03-21 DIAGNOSIS — R9431 Abnormal electrocardiogram [ECG] [EKG]: Secondary | ICD-10-CM | POA: Diagnosis not present

## 2024-03-21 DIAGNOSIS — M00252 Other streptococcal arthritis, left hip: Secondary | ICD-10-CM | POA: Diagnosis present

## 2024-03-21 DIAGNOSIS — C50012 Malignant neoplasm of nipple and areola, left female breast: Secondary | ICD-10-CM

## 2024-03-21 DIAGNOSIS — C787 Secondary malignant neoplasm of liver and intrahepatic bile duct: Secondary | ICD-10-CM | POA: Diagnosis not present

## 2024-03-21 DIAGNOSIS — B951 Streptococcus, group B, as the cause of diseases classified elsewhere: Secondary | ICD-10-CM | POA: Diagnosis not present

## 2024-03-21 DIAGNOSIS — C50919 Malignant neoplasm of unspecified site of unspecified female breast: Secondary | ICD-10-CM | POA: Diagnosis not present

## 2024-03-21 DIAGNOSIS — Z7901 Long term (current) use of anticoagulants: Secondary | ICD-10-CM

## 2024-03-21 DIAGNOSIS — E871 Hypo-osmolality and hyponatremia: Secondary | ICD-10-CM | POA: Diagnosis present

## 2024-03-21 DIAGNOSIS — I492 Junctional premature depolarization: Secondary | ICD-10-CM | POA: Diagnosis not present

## 2024-03-21 DIAGNOSIS — D630 Anemia in neoplastic disease: Secondary | ICD-10-CM | POA: Diagnosis not present

## 2024-03-21 DIAGNOSIS — C7800 Secondary malignant neoplasm of unspecified lung: Secondary | ICD-10-CM | POA: Diagnosis present

## 2024-03-21 DIAGNOSIS — M009 Pyogenic arthritis, unspecified: Secondary | ICD-10-CM | POA: Diagnosis not present

## 2024-03-21 DIAGNOSIS — R001 Bradycardia, unspecified: Secondary | ICD-10-CM | POA: Diagnosis not present

## 2024-03-21 DIAGNOSIS — R4182 Altered mental status, unspecified: Secondary | ICD-10-CM | POA: Diagnosis not present

## 2024-03-21 DIAGNOSIS — K229 Disease of esophagus, unspecified: Secondary | ICD-10-CM | POA: Diagnosis present

## 2024-03-21 DIAGNOSIS — Z515 Encounter for palliative care: Secondary | ICD-10-CM | POA: Diagnosis not present

## 2024-03-21 DIAGNOSIS — R21 Rash and other nonspecific skin eruption: Secondary | ICD-10-CM | POA: Diagnosis not present

## 2024-03-21 DIAGNOSIS — G9340 Encephalopathy, unspecified: Secondary | ICD-10-CM | POA: Diagnosis not present

## 2024-03-21 DIAGNOSIS — E782 Mixed hyperlipidemia: Secondary | ICD-10-CM

## 2024-03-21 DIAGNOSIS — R0902 Hypoxemia: Secondary | ICD-10-CM | POA: Diagnosis not present

## 2024-03-21 DIAGNOSIS — Z86711 Personal history of pulmonary embolism: Secondary | ICD-10-CM | POA: Diagnosis not present

## 2024-03-21 DIAGNOSIS — Z9049 Acquired absence of other specified parts of digestive tract: Secondary | ICD-10-CM

## 2024-03-21 DIAGNOSIS — Z79899 Other long term (current) drug therapy: Secondary | ICD-10-CM | POA: Diagnosis not present

## 2024-03-21 DIAGNOSIS — G9341 Metabolic encephalopathy: Secondary | ICD-10-CM | POA: Diagnosis not present

## 2024-03-21 DIAGNOSIS — K3189 Other diseases of stomach and duodenum: Secondary | ICD-10-CM | POA: Diagnosis present

## 2024-03-21 DIAGNOSIS — I110 Hypertensive heart disease with heart failure: Secondary | ICD-10-CM | POA: Diagnosis present

## 2024-03-21 DIAGNOSIS — F411 Generalized anxiety disorder: Secondary | ICD-10-CM | POA: Diagnosis present

## 2024-03-21 DIAGNOSIS — E8809 Other disorders of plasma-protein metabolism, not elsewhere classified: Secondary | ICD-10-CM | POA: Diagnosis present

## 2024-03-21 DIAGNOSIS — I959 Hypotension, unspecified: Secondary | ICD-10-CM | POA: Diagnosis not present

## 2024-03-21 DIAGNOSIS — L509 Urticaria, unspecified: Secondary | ICD-10-CM | POA: Diagnosis not present

## 2024-03-21 DIAGNOSIS — M0088 Arthritis due to other bacteria, vertebrae: Secondary | ICD-10-CM | POA: Diagnosis present

## 2024-03-21 DIAGNOSIS — E441 Mild protein-calorie malnutrition: Secondary | ICD-10-CM | POA: Diagnosis present

## 2024-03-21 DIAGNOSIS — E872 Acidosis, unspecified: Secondary | ICD-10-CM | POA: Diagnosis not present

## 2024-03-21 DIAGNOSIS — Z8249 Family history of ischemic heart disease and other diseases of the circulatory system: Secondary | ICD-10-CM

## 2024-03-21 DIAGNOSIS — M4658 Other infective spondylopathies, sacral and sacrococcygeal region: Secondary | ICD-10-CM | POA: Diagnosis present

## 2024-03-21 DIAGNOSIS — Z95828 Presence of other vascular implants and grafts: Secondary | ICD-10-CM

## 2024-03-21 DIAGNOSIS — R404 Transient alteration of awareness: Secondary | ICD-10-CM | POA: Diagnosis not present

## 2024-03-21 DIAGNOSIS — E66813 Obesity, class 3: Secondary | ICD-10-CM | POA: Diagnosis present

## 2024-03-21 DIAGNOSIS — I5030 Unspecified diastolic (congestive) heart failure: Secondary | ICD-10-CM | POA: Diagnosis not present

## 2024-03-21 DIAGNOSIS — Z743 Need for continuous supervision: Secondary | ICD-10-CM | POA: Diagnosis not present

## 2024-03-21 DIAGNOSIS — Z9013 Acquired absence of bilateral breasts and nipples: Secondary | ICD-10-CM

## 2024-03-21 DIAGNOSIS — R7881 Bacteremia: Secondary | ICD-10-CM | POA: Diagnosis present

## 2024-03-21 DIAGNOSIS — M6281 Muscle weakness (generalized): Secondary | ICD-10-CM | POA: Diagnosis present

## 2024-03-21 DIAGNOSIS — D638 Anemia in other chronic diseases classified elsewhere: Secondary | ICD-10-CM | POA: Diagnosis present

## 2024-03-21 DIAGNOSIS — I871 Compression of vein: Secondary | ICD-10-CM | POA: Diagnosis present

## 2024-03-21 DIAGNOSIS — I10 Essential (primary) hypertension: Secondary | ICD-10-CM | POA: Diagnosis not present

## 2024-03-21 DIAGNOSIS — D7219 Other eosinophilia: Secondary | ICD-10-CM | POA: Diagnosis present

## 2024-03-21 DIAGNOSIS — K3533 Acute appendicitis with perforation and localized peritonitis, with abscess: Secondary | ICD-10-CM | POA: Diagnosis not present

## 2024-03-21 DIAGNOSIS — I5032 Chronic diastolic (congestive) heart failure: Secondary | ICD-10-CM | POA: Diagnosis not present

## 2024-03-21 DIAGNOSIS — L270 Generalized skin eruption due to drugs and medicaments taken internally: Secondary | ICD-10-CM | POA: Diagnosis present

## 2024-03-21 DIAGNOSIS — R627 Adult failure to thrive: Secondary | ICD-10-CM | POA: Diagnosis present

## 2024-03-21 DIAGNOSIS — E785 Hyperlipidemia, unspecified: Secondary | ICD-10-CM | POA: Diagnosis present

## 2024-03-21 DIAGNOSIS — N179 Acute kidney failure, unspecified: Secondary | ICD-10-CM | POA: Diagnosis present

## 2024-03-21 DIAGNOSIS — Z9221 Personal history of antineoplastic chemotherapy: Secondary | ICD-10-CM

## 2024-03-21 DIAGNOSIS — Z7401 Bed confinement status: Secondary | ICD-10-CM | POA: Diagnosis not present

## 2024-03-21 DIAGNOSIS — Z880 Allergy status to penicillin: Secondary | ICD-10-CM

## 2024-03-21 DIAGNOSIS — Z66 Do not resuscitate: Secondary | ICD-10-CM | POA: Diagnosis not present

## 2024-03-21 DIAGNOSIS — C7951 Secondary malignant neoplasm of bone: Secondary | ICD-10-CM | POA: Diagnosis present

## 2024-03-21 DIAGNOSIS — R000 Tachycardia, unspecified: Secondary | ICD-10-CM | POA: Diagnosis not present

## 2024-03-21 DIAGNOSIS — Z741 Need for assistance with personal care: Secondary | ICD-10-CM | POA: Diagnosis present

## 2024-03-21 DIAGNOSIS — Z923 Personal history of irradiation: Secondary | ICD-10-CM

## 2024-03-21 DIAGNOSIS — Z7189 Other specified counseling: Secondary | ICD-10-CM | POA: Diagnosis not present

## 2024-03-21 DIAGNOSIS — Z881 Allergy status to other antibiotic agents status: Secondary | ICD-10-CM

## 2024-03-21 DIAGNOSIS — R2681 Unsteadiness on feet: Secondary | ICD-10-CM | POA: Diagnosis present

## 2024-03-21 DIAGNOSIS — R0602 Shortness of breath: Secondary | ICD-10-CM | POA: Diagnosis not present

## 2024-03-21 DIAGNOSIS — Z6841 Body Mass Index (BMI) 40.0 and over, adult: Secondary | ICD-10-CM | POA: Diagnosis not present

## 2024-03-21 DIAGNOSIS — Z853 Personal history of malignant neoplasm of breast: Secondary | ICD-10-CM

## 2024-03-21 DIAGNOSIS — A419 Sepsis, unspecified organism: Secondary | ICD-10-CM | POA: Diagnosis not present

## 2024-03-21 DIAGNOSIS — R410 Disorientation, unspecified: Secondary | ICD-10-CM | POA: Diagnosis not present

## 2024-03-21 DIAGNOSIS — I471 Supraventricular tachycardia, unspecified: Secondary | ICD-10-CM | POA: Diagnosis not present

## 2024-03-21 DIAGNOSIS — M4628 Osteomyelitis of vertebra, sacral and sacrococcygeal region: Secondary | ICD-10-CM | POA: Diagnosis present

## 2024-03-21 DIAGNOSIS — Z833 Family history of diabetes mellitus: Secondary | ICD-10-CM

## 2024-03-21 DIAGNOSIS — Z171 Estrogen receptor negative status [ER-]: Secondary | ICD-10-CM | POA: Diagnosis not present

## 2024-03-21 DIAGNOSIS — T361X5A Adverse effect of cephalosporins and other beta-lactam antibiotics, initial encounter: Secondary | ICD-10-CM | POA: Diagnosis present

## 2024-03-21 DIAGNOSIS — C50011 Malignant neoplasm of nipple and areola, right female breast: Secondary | ICD-10-CM | POA: Diagnosis not present

## 2024-03-21 DIAGNOSIS — R0689 Other abnormalities of breathing: Secondary | ICD-10-CM | POA: Diagnosis not present

## 2024-03-21 DIAGNOSIS — Z789 Other specified health status: Secondary | ICD-10-CM | POA: Diagnosis not present

## 2024-03-21 HISTORY — DX: Osteomyelitis of vertebra, sacral and sacrococcygeal region: M46.28

## 2024-03-21 HISTORY — DX: Bacteremia: R78.81

## 2024-03-21 HISTORY — DX: Pyogenic arthritis, unspecified: M00.9

## 2024-03-21 MED ORDER — MENTHOL 3 MG MT LOZG
1.0000 | LOZENGE | OROMUCOSAL | Status: DC | PRN
  Administered 2024-03-22 (×2): 3 mg via ORAL
  Filled 2024-03-21: qty 9

## 2024-03-21 MED ORDER — MELATONIN 3 MG PO TABS
3.0000 mg | ORAL_TABLET | Freq: Every evening | ORAL | Status: DC | PRN
  Administered 2024-03-22 (×2): 3 mg via ORAL
  Filled 2024-03-21 (×2): qty 1

## 2024-03-21 MED ORDER — DIPHENHYDRAMINE HCL 50 MG/ML IJ SOLN
12.5000 mg | INTRAMUSCULAR | Status: AC
  Administered 2024-03-21: 12.5 mg via INTRAVENOUS
  Filled 2024-03-21: qty 1

## 2024-03-21 MED ORDER — ONDANSETRON HCL 4 MG/2ML IJ SOLN: 4.0000 mg | Freq: Four times a day (QID) | INTRAMUSCULAR | Status: DC | PRN

## 2024-03-21 MED ORDER — ACETAMINOPHEN 650 MG RE SUPP
650.0000 mg | Freq: Four times a day (QID) | RECTAL | Status: DC | PRN
Start: 2024-03-21 — End: 2024-03-29

## 2024-03-21 MED ORDER — LACTATED RINGERS IV SOLN: INTRAVENOUS | Status: DC

## 2024-03-21 MED ORDER — APIXABAN 5 MG PO TABS
5.0000 mg | ORAL_TABLET | Freq: Two times a day (BID) | ORAL | Status: DC
  Administered 2024-03-22 – 2024-03-29 (×16): 5 mg via ORAL
  Filled 2024-03-21 (×16): qty 1

## 2024-03-21 MED ORDER — FAMOTIDINE IN NACL 20-0.9 MG/50ML-% IV SOLN
20.0000 mg | Freq: Once | INTRAVENOUS | Status: AC
  Administered 2024-03-22: 20 mg via INTRAVENOUS
  Filled 2024-03-21: qty 50

## 2024-03-21 MED ORDER — ACETAMINOPHEN 325 MG PO TABS
650.0000 mg | ORAL_TABLET | Freq: Four times a day (QID) | ORAL | Status: DC | PRN
  Administered 2024-03-22: 650 mg via ORAL
  Filled 2024-03-21 (×2): qty 2

## 2024-03-21 MED ORDER — LACTATED RINGERS IV BOLUS
500.0000 mL | Freq: Once | INTRAVENOUS | Status: AC
  Administered 2024-03-21: 500 mL via INTRAVENOUS

## 2024-03-21 NOTE — H&P (Signed)
 History and Physical      [name] FMW:[id number] DOB: 1950/12/02 DOA: 03/21/2024; DOS: 03/21/2024  PCP: Jefferey Fitch, MD *** Patient coming from: home ***  I have personally briefly reviewed patient's old medical records in [facility]  Chief Complaint: ***  HPI: [name] is a 73 y.o. female with medical history significant for *** who is admitted to [facility] on 03/21/2024 with *** after presenting from home*** to [facility] ED complaining of ***.    ***       ***   ED Course:  Vital signs in the ED were notable for the following: ***  Labs were notable for the following: ***  Per my interpretation, EKG in ED demonstrated the following:  ***  Imaging in the ED, per corresponding formal radiology read, was notable for the following:  ***  While in the ED, the following were administered: ***  Subsequently, the patient was admitted  ***  ***red    Review of Systems: As per HPI otherwise 10 point review of systems negative.   Past Medical History:  Diagnosis Date   Anemia    pt pt report   Anxiety    Breast cancer (HCC)    mets to liver and lung   Breast cancer metastasized to multiple sites [facility]) 02/26/2013   Cellulitis    CHF (congestive heart failure) (HCC)    History of chemotherapy 09/2004   taxotere/herceptin /carboplatin   Hypertension    Neuropathy    Radiation 07/31/2006   left upper chest   Radiation 06/17/2006-06/27/2006   6480 cGy bilat. chest wall   SVC syndrome    Thrombosis     Past Surgical History:  Procedure Laterality Date   ANKLE SURGERY Left    BACK SURGERY     CHOLECYSTECTOMY  08/28/1987   ESOPHAGOGASTRODUODENOSCOPY Left 01/21/2024   Procedure: EGD (ESOPHAGOGASTRODUODENOSCOPY);  Surgeon: Burnette Fallow, MD;  Location: [facility];  Service: Gastroenterology;  Laterality: Left;   MASS EXCISION Left 05/10/2022   Procedure: EXCISION OF LEFT CHEST WALL MASS;  Surgeon: Vanderbilt Ned, MD;  Location: [facility]  OR;  Service: General;  Laterality: Left;   MASTECTOMY Bilateral    w/ lymph node removal per patient   PERIPHERALLY INSERTED CENTRAL CATHETER INSERTION     PICC LINE INSERTION     PICC LINE REMOVAL ([facility] HX)     SURGICAL EXCISION OF EXCESSIVE SKIN     TUBAL LIGATION  08/27/1984    Social History:  reports that she has never smoked. She has never been exposed to tobacco smoke. She has never used smokeless tobacco. She reports current alcohol  use. She reports that she does not use drugs.   Allergies  Allergen Reactions   Adhesive [Tape] Other (See Comments)    Tears the skin    Penicillins Hives and Other (See Comments)    PATIENT HAS TOLERATED Cephalosporins    Family History  Problem Relation Age of Onset   Heart failure Father    Cancer Father        Prostate cancer   Heart failure Brother    Cancer Brother        Prostate cancer   Diabetes Maternal Aunt     Family history reviewed and not pertinent ***   Prior to Admission medications   Medication Sig Start Date End Date Taking? Authorizing Provider  acetaminophen  (TYLENOL ) 500 MG tablet Take 500-1,000 mg by mouth every 6 (six) hours as needed for mild pain (pain score  1-3), fever or moderate pain (pain score 4-6).    [provider]  albuterol  (VENTOLIN  HFA) 108 (90 Base) MCG/ACT inhaler Inhale 2 puffs into the lungs every 6 (six) hours as needed for wheezing. 06/28/22   Gudena, Vinay, MD  ALPRAZolam  (XANAX ) 1 MG tablet Take 1 tablet (1 mg total) by mouth 3 (three) times daily as needed for anxiety. 12/31/23   Gudena, Vinay, MD  amLODipine  (NORVASC ) 10 MG tablet Take 1 tablet (10 mg total) by mouth every morning. 09/10/19   Magrinat, Sandria BROCKS, MD  apixaban  (ELIQUIS ) 5 MG TABS tablet Take 1 tablet (5 mg total) by mouth 2 (two) times daily. 08/06/23   Gudena, Vinay, MD  CALCIUM  PO Take 2 tablets by mouth daily.    [provider]  carvedilol  (COREG ) 12.5 MG tablet Take 1 tablet (12.5 mg total) by mouth 2  (two) times daily with a meal. 02/20/24   Perri DELENA Meliton Mickey., MD  cefTRIAXone  (ROCEPHIN ) IVPB Inject 2 g into the vein daily. Indication:  L-iliac fossa abscess First Dose: Yes Last Day of Therapy:  03/30/24 Labs - Once weekly:  CBC/D and BMP, Labs - Once weekly- ESR and CRP Method of administration: IV Push Method of administration may be changed at the discretion of home infusion pharmacist based upon assessment of the patient and/or caregiver's ability to self-administer the medication ordered. Patient not taking: Reported on 03/20/2024 03/17/24 04/25/24  Gherghe, Costin M, MD  cyclobenzaprine  (FLEXERIL ) 5 MG tablet Take 1 tablet (5 mg total) by mouth 2 (two) times daily as needed for muscle spasms. Patient not taking: Reported on 03/20/2024 12/31/23   Gudena, Vinay, MD  daptomycin  (CUBICIN ) IVPB Inject 700 mg into the vein daily. Indication:  L-iliac fossa abscess First Dose: Yes Last Day of Therapy:  03/30/24 Labs - Once weekly:  CBC/D, BMP, and CPK Labs - Once weekly- ESR and CRP Method of administration: IV Push Method of administration may be changed at the discretion of home infusion pharmacist based upon assessment of the patient and/or caregiver's ability to self-administer the medication ordered. Patient not taking: Reported on 03/20/2024 03/17/24 04/25/24  Gherghe, Costin M, MD  DULoxetine  (CYMBALTA ) 60 MG capsule Take 60 mg by mouth daily. 12/17/22   [provider]  furosemide  (LASIX ) 40 MG tablet Take 40 mg by mouth as needed for fluid or edema.    [provider]  gabapentin  (NEURONTIN ) 300 MG capsule TAKE 2 CAPSULES BY MOUTH THREE TIMES DAILY 04/15/23   Odean Potts, MD  losartan  (COZAAR ) 100 MG tablet Take 100 mg by mouth every morning.    [provider]  ondansetron  (ZOFRAN ) 8 MG tablet Take 1 tablet (8 mg total) by mouth every 8 (eight) hours as needed for nausea or vomiting. 01/21/24   Laurence Locus, DO  Oxycodone  HCl 10 MG TABS Take 10 mg by mouth every 6  (six) hours as needed (Pain). 01/24/24   [provider]  pantoprazole  (PROTONIX ) 40 MG tablet Take 1 tablet (40 mg total) by mouth daily. Patient not taking: Reported on 03/20/2024 01/21/24 02/20/24  Laurence Locus, DO  predniSONE (DELTASONE) 5 MG tablet Take 5 mg by mouth daily as needed (inflammation).    [provider]  rosuvastatin  (CRESTOR ) 10 MG tablet Take 1 tablet (10 mg total) by mouth daily. 11/14/23 11/13/24  Rolan Ezra RAMAN, MD  spironolactone  (ALDACTONE ) 25 MG tablet Take 1 tablet daily Patient not taking: Reported on 03/13/2024 01/24/24   Rolan Ezra RAMAN, MD  temazepam  (  RESTORIL ) 30 MG capsule Take 1 capsule (30 mg total) by mouth at bedtime. 01/28/24   Crawford Morna Pickle, NP     Objective    Physical Exam: Vitals:   03/21/24 2300  BP: 98/73  Pulse: 94  Resp: 18  Temp: (!) 97.5 F (36.4 C)  TempSrc: Oral  SpO2: 99%    General: appears to be stated age; alert, oriented Skin: warm, dry, no rash Head:  AT/ Mouth:  Oral mucosa membranes appear moist, normal dentition Neck: supple; trachea midline Heart:  RRR; did not appreciate any M/R/G Lungs: CTAB, did not appreciate any wheezes, rales, or rhonchi Abdomen: + BS; soft, ND, NT Vascular: 2+ pedal pulses b/l; 2+ radial pulses b/l Extremities: no peripheral edema, no muscle wasting Neuro: strength and sensation intact in upper and lower extremities b/l ***   *** Neuro: 5/5 strength of the proximal and distal flexors and extensors of the upper and lower extremities bilaterally; sensation intact in upper and lower extremities b/l; cranial nerves II through XII grossly intact; no pronator drift; no evidence suggestive of slurred speech, dysarthria, or facial droop; Normal muscle tone. No tremors.  *** Neuro: In the setting of the patient's current mental status and associated inability to follow instructions, unable to perform full neurologic exam at this time.  As such, assessment of strength,  sensation, and cranial nerves is limited at this time. Patient noted to spontaneously move all 4 extremities. No tremors.  ***    Labs on Admission: I have personally reviewed following labs and imaging studies  CBC: Recent Labs  Lab 03/15/24 0256 03/16/24 0354 03/17/24 0343  WBC 10.4 10.6* 12.0*  HGB 9.7* 10.1* 10.3*  HCT 27.6* 27.9* 29.0*  MCV 77.3* 76.0* 77.7*  PLT 262 275 262   Basic Metabolic Panel: Recent Labs  Lab 03/15/24 0256 03/16/24 0354 03/17/24 0343  NA 134* 134* 132*  K 3.2* 3.5 3.8  CL 101 103 102  CO2 20* 21* 19*  GLUCOSE 91 88 81  BUN 5* <5* 5*  CREATININE 0.84 0.80 0.91  CALCIUM  8.1* 8.0* 7.6*  MG 1.7 1.9 1.5*  PHOS 2.8  --   --    GFR: Estimated Creatinine Clearance: 69.2 mL/min (by C-G formula based on SCr of 0.91 mg/dL). Liver Function Tests: Recent Labs  Lab 03/15/24 0256 03/17/24 0343  AST 57* 37  ALT 37 33  ALKPHOS 65 94  BILITOT 1.0 1.3*  PROT 5.6* 5.1*  ALBUMIN  2.1* 2.0*   No results for input(s): LIPASE, AMYLASE in the last 168 hours. Recent Labs  Lab 03/15/24 1042  AMMONIA 33   Coagulation Profile: No results for input(s): INR, PROTIME in the last 168 hours. Cardiac Enzymes: No results for input(s): CKTOTAL, CKMB, CKMBINDEX, TROPONINI in the last 168 hours. BNP (last 3 results) No results for input(s): PROBNP in the last 8760 hours. HbA1C: No results for input(s): HGBA1C in the last 72 hours. CBG: No results for input(s): GLUCAP in the last 168 hours. Lipid Profile: No results for input(s): CHOL, HDL, LDLCALC, TRIG, CHOLHDL, LDLDIRECT in the last 72 hours. Thyroid Function Tests: No results for input(s): TSH, T4TOTAL, FREET4, T3FREE, THYROIDAB in the last 72 hours. Anemia Panel: No results for input(s): VITAMINB12, FOLATE, FERRITIN, TIBC, IRON, RETICCTPCT in the last 72 hours. Urine analysis:    Component Value Date/Time   COLORURINE AMBER (A) 03/14/2024 1719    APPEARANCEUR HAZY (A) 03/14/2024 1719   LABSPEC 1.038 (H) 03/14/2024 1719   LABSPEC 1.020 10/28/2014 1142  PHURINE 6.0 03/14/2024 1719   GLUCOSEU NEGATIVE 03/14/2024 1719   GLUCOSEU Negative 10/28/2014 1142   HGBUR SMALL (A) 03/14/2024 1719   BILIRUBINUR MODERATE (A) 03/14/2024 1719   BILIRUBINUR Negative 10/28/2014 1142   KETONESUR 20 (A) 03/14/2024 1719   PROTEINUR 100 (A) 03/14/2024 1719   UROBILINOGEN 0.2 10/28/2014 1142   NITRITE NEGATIVE 03/14/2024 1719   LEUKOCYTESUR NEGATIVE 03/14/2024 1719   LEUKOCYTESUR Trace 10/28/2014 1142    Radiological Exams on Admission: No results found.    Assessment/Plan   Principal Problem:   Acute encephalopathy   ***            ***                  ***                   ***                  ***                  ***                  ***                   ***                  ***                  ***                  ***                  ***                 ***                ***  DVT prophylaxis: SCD's ***  Code Status: Full code*** Family Communication: none*** Disposition Plan: Per Rounding Team Consults called: none***;  Admission status: ***     I SPENT GREATER THAN 75 *** MINUTES IN CLINICAL CARE TIME/MEDICAL DECISION-MAKING IN COMPLETING THIS ADMISSION.      Eva NOVAK Anitra Doxtater DO [facility]  From 7PM - 7AM   03/21/2024, 11:23 PM   ***

## 2024-03-22 ENCOUNTER — Other Ambulatory Visit: Payer: Self-pay

## 2024-03-22 ENCOUNTER — Inpatient Hospital Stay (HOSPITAL_COMMUNITY)

## 2024-03-22 DIAGNOSIS — C50012 Malignant neoplasm of nipple and areola, left female breast: Secondary | ICD-10-CM

## 2024-03-22 DIAGNOSIS — F411 Generalized anxiety disorder: Secondary | ICD-10-CM | POA: Diagnosis present

## 2024-03-22 DIAGNOSIS — E872 Acidosis, unspecified: Secondary | ICD-10-CM | POA: Diagnosis present

## 2024-03-22 DIAGNOSIS — N179 Acute kidney failure, unspecified: Secondary | ICD-10-CM | POA: Diagnosis present

## 2024-03-22 DIAGNOSIS — C50011 Malignant neoplasm of nipple and areola, right female breast: Secondary | ICD-10-CM

## 2024-03-22 DIAGNOSIS — E871 Hypo-osmolality and hyponatremia: Secondary | ICD-10-CM | POA: Insufficient documentation

## 2024-03-22 DIAGNOSIS — Z171 Estrogen receptor negative status [ER-]: Secondary | ICD-10-CM

## 2024-03-22 DIAGNOSIS — G9340 Encephalopathy, unspecified: Secondary | ICD-10-CM

## 2024-03-22 LAB — CBC WITH DIFFERENTIAL/PLATELET
Abs Immature Granulocytes: 0 K/uL (ref 0.00–0.07)
Abs Immature Granulocytes: 0.05 K/uL (ref 0.00–0.07)
Basophils Absolute: 0.1 K/uL (ref 0.0–0.1)
Basophils Absolute: 0.1 K/uL (ref 0.0–0.1)
Basophils Relative: 1 %
Basophils Relative: 1 %
Eosinophils Absolute: 4 K/uL — ABNORMAL HIGH (ref 0.0–0.5)
Eosinophils Absolute: 5 K/uL — ABNORMAL HIGH (ref 0.0–0.5)
Eosinophils Relative: 34 %
Eosinophils Relative: 42 %
HCT: 27 % — ABNORMAL LOW (ref 36.0–46.0)
HCT: 30.4 % — ABNORMAL LOW (ref 36.0–46.0)
Hemoglobin: 11.1 g/dL — ABNORMAL LOW (ref 12.0–15.0)
Hemoglobin: 9.9 g/dL — ABNORMAL LOW (ref 12.0–15.0)
Immature Granulocytes: 0 %
Lymphocytes Relative: 19 %
Lymphocytes Relative: 4 %
Lymphs Abs: 0.5 K/uL — ABNORMAL LOW (ref 0.7–4.0)
Lymphs Abs: 2.3 K/uL (ref 0.7–4.0)
MCH: 27.1 pg (ref 26.0–34.0)
MCH: 27.4 pg (ref 26.0–34.0)
MCHC: 36.5 g/dL — ABNORMAL HIGH (ref 30.0–36.0)
MCHC: 36.7 g/dL — ABNORMAL HIGH (ref 30.0–36.0)
MCV: 74 fL — ABNORMAL LOW (ref 80.0–100.0)
MCV: 75.1 fL — ABNORMAL LOW (ref 80.0–100.0)
Monocytes Absolute: 0.5 K/uL (ref 0.1–1.0)
Monocytes Absolute: 0.9 K/uL (ref 0.1–1.0)
Monocytes Relative: 4 %
Monocytes Relative: 8 %
Neutro Abs: 4.6 K/uL (ref 1.7–7.7)
Neutro Abs: 5.8 K/uL (ref 1.7–7.7)
Neutrophils Relative %: 38 %
Neutrophils Relative %: 49 %
Platelets: 152 K/uL (ref 150–400)
Platelets: 183 K/uL (ref 150–400)
RBC: 3.65 MIL/uL — ABNORMAL LOW (ref 3.87–5.11)
RBC: 4.05 MIL/uL (ref 3.87–5.11)
RDW: 16.6 % — ABNORMAL HIGH (ref 11.5–15.5)
RDW: 16.7 % — ABNORMAL HIGH (ref 11.5–15.5)
WBC: 11.8 K/uL — ABNORMAL HIGH (ref 4.0–10.5)
WBC: 11.9 K/uL — ABNORMAL HIGH (ref 4.0–10.5)
nRBC: 0 % (ref 0.0–0.2)
nRBC: 0 % (ref 0.0–0.2)

## 2024-03-22 LAB — VITAMIN B12: Vitamin B-12: 320 pg/mL (ref 180–914)

## 2024-03-22 LAB — TSH: TSH: 3.025 u[IU]/mL (ref 0.350–4.500)

## 2024-03-22 LAB — BASIC METABOLIC PANEL WITH GFR
Anion gap: 10 (ref 5–15)
BUN: 22 mg/dL (ref 8–23)
CO2: 18 mmol/L — ABNORMAL LOW (ref 22–32)
Calcium: 7.2 mg/dL — ABNORMAL LOW (ref 8.9–10.3)
Chloride: 104 mmol/L (ref 98–111)
Creatinine, Ser: 1.36 mg/dL — ABNORMAL HIGH (ref 0.44–1.00)
GFR, Estimated: 41 mL/min — ABNORMAL LOW (ref >60)
Glucose, Bld: 88 mg/dL (ref 70–99)
Potassium: 3.7 mmol/L (ref 3.5–5.1)
Sodium: 132 mmol/L — ABNORMAL LOW (ref 135–145)

## 2024-03-22 LAB — BRAIN NATRIURETIC PEPTIDE: B Natriuretic Peptide: 31.2 pg/mL (ref 0.0–100.0)

## 2024-03-22 LAB — LACTIC ACID, PLASMA
Lactic Acid, Venous: 2.4 mmol/L (ref 0.5–1.9)
Lactic Acid, Venous: 3.2 mmol/L (ref 0.5–1.9)

## 2024-03-22 LAB — PROTIME-INR
INR: 1.8 — ABNORMAL HIGH (ref 0.8–1.2)
Prothrombin Time: 21.5 s — ABNORMAL HIGH (ref 11.4–15.2)

## 2024-03-22 LAB — PROCALCITONIN: Procalcitonin: 3.5 ng/mL

## 2024-03-22 LAB — COMPREHENSIVE METABOLIC PANEL WITH GFR
ALT: 18 U/L (ref 0–44)
AST: 21 U/L (ref 15–41)
Albumin: 1.5 g/dL — ABNORMAL LOW (ref 3.5–5.0)
Alkaline Phosphatase: 83 U/L (ref 38–126)
Anion gap: 9 (ref 5–15)
BUN: 20 mg/dL (ref 8–23)
CO2: 18 mmol/L — ABNORMAL LOW (ref 22–32)
Calcium: 7.1 mg/dL — ABNORMAL LOW (ref 8.9–10.3)
Chloride: 106 mmol/L (ref 98–111)
Creatinine, Ser: 1.19 mg/dL — ABNORMAL HIGH (ref 0.44–1.00)
GFR, Estimated: 49 mL/min — ABNORMAL LOW (ref >60)
Glucose, Bld: 90 mg/dL (ref 70–99)
Potassium: 3.3 mmol/L — ABNORMAL LOW (ref 3.5–5.1)
Sodium: 133 mmol/L — ABNORMAL LOW (ref 135–145)
Total Bilirubin: 0.9 mg/dL (ref 0.0–1.2)
Total Protein: 3.9 g/dL — ABNORMAL LOW (ref 6.5–8.1)

## 2024-03-22 LAB — AMMONIA: Ammonia: 19 umol/L (ref 9–35)

## 2024-03-22 LAB — MAGNESIUM
Magnesium: 1.6 mg/dL — ABNORMAL LOW (ref 1.7–2.4)
Magnesium: 1.9 mg/dL (ref 1.7–2.4)

## 2024-03-22 LAB — CK: Total CK: 158 U/L (ref 38–234)

## 2024-03-22 MED ORDER — DIPHENHYDRAMINE-ZINC ACETATE 2-0.1 % EX CREA
TOPICAL_CREAM | Freq: Three times a day (TID) | CUTANEOUS | Status: DC | PRN
  Filled 2024-03-22: qty 28

## 2024-03-22 MED ORDER — CHLORHEXIDINE GLUCONATE CLOTH 2 % EX PADS
6.0000 | MEDICATED_PAD | Freq: Every day | CUTANEOUS | Status: DC
  Administered 2024-03-22 – 2024-03-29 (×8): 6 via TOPICAL

## 2024-03-22 MED ORDER — POTASSIUM CHLORIDE 20 MEQ PO PACK
60.0000 meq | PACK | Freq: Once | ORAL | Status: AC
  Administered 2024-03-22: 60 meq via ORAL
  Filled 2024-03-22: qty 3

## 2024-03-22 MED ORDER — SODIUM CHLORIDE 0.9 % IV SOLN
1.0000 g | INTRAVENOUS | Status: DC
  Administered 2024-03-22 – 2024-03-29 (×8): 1 g via INTRAVENOUS
  Filled 2024-03-22 (×8): qty 1000

## 2024-03-22 MED ORDER — DAPTOMYCIN-SODIUM CHLORIDE 700-0.9 MG/100ML-% IV SOLN
700.0000 mg | Freq: Every day | INTRAVENOUS | Status: DC
  Filled 2024-03-22: qty 100

## 2024-03-22 MED ORDER — DIPHENHYDRAMINE HCL 25 MG PO CAPS
25.0000 mg | ORAL_CAPSULE | Freq: Two times a day (BID) | ORAL | Status: DC | PRN
  Administered 2024-03-22: 25 mg via ORAL
  Filled 2024-03-22: qty 1

## 2024-03-22 MED ORDER — LINEZOLID 600 MG/300ML IV SOLN
600.0000 mg | Freq: Two times a day (BID) | INTRAVENOUS | Status: DC
  Administered 2024-03-22 – 2024-03-23 (×3): 600 mg via INTRAVENOUS
  Filled 2024-03-22 (×4): qty 300

## 2024-03-22 MED ORDER — HYDROCORTISONE 1 % EX CREA
TOPICAL_CREAM | Freq: Two times a day (BID) | CUTANEOUS | Status: AC
  Administered 2024-03-23: 1 via TOPICAL
  Filled 2024-03-22: qty 28

## 2024-03-22 MED ORDER — OXYCODONE HCL 5 MG PO TABS
5.0000 mg | ORAL_TABLET | ORAL | Status: DC | PRN
  Administered 2024-03-22 – 2024-03-24 (×3): 5 mg via ORAL
  Filled 2024-03-22 (×3): qty 1

## 2024-03-22 MED ORDER — MAGNESIUM SULFATE 2 GM/50ML IV SOLN
2.0000 g | Freq: Once | INTRAVENOUS | Status: AC
  Administered 2024-03-22: 2 g via INTRAVENOUS
  Filled 2024-03-22: qty 50

## 2024-03-22 NOTE — Assessment & Plan Note (Addendum)
 Group B strep bacteremia - Consult ID - Continue ertapenem  - Change to linezolid 

## 2024-03-22 NOTE — Assessment & Plan Note (Addendum)
 Hgb stable, no clinical bleeding  Had been admitted last may for hematemesis, EGD unremarkable.  Readmitted last week for vomiting again and esophageal thickening on imaging, work up unremarkable.  Now resolved on CT chest.

## 2024-03-22 NOTE — Assessment & Plan Note (Signed)
 -  Supplement magnesium

## 2024-03-22 NOTE — Progress Notes (Signed)
 Pharmacy Antibiotic Note  [name] is a 73 y.o. female admitted on 03/21/2024 with septic arthritis/osteomyelitis.  Pharmacy has been consulted for continuation of Daptomycin  and Ertapenem  dosing. WBC mildly elevated. Mild bump in Scr from baseline.   Plan: Daptomycin  700 mg IV q24h Ertapenem  1g IV q24h Current stop date per ID: 03/31/24  Height: 5' 3 (160 cm) Weight: 107.2 kg (236 lb 5.3 oz) IBW/kg (Calculated) : 52.4  Temp (24hrs), Avg:98.5 F (36.9 C), Min:97.5 F (36.4 C), Max:99.5 F (37.5 C)  Recent Labs  Lab 03/16/24 0354 03/17/24 0343 03/22/24 0012  WBC 10.6* 12.0* 11.8*  CREATININE 0.80 0.91 1.36*  LATICACIDVEN  --   --  2.4*    Estimated Creatinine Clearance: 43.9 mL/min (A) (by C-G formula based on SCr of 1.36 mg/dL (H)).    Allergies  Allergen Reactions   Adhesive [Tape] Other (See Comments)    Tears the skin    Penicillins Hives and Other (See Comments)    PATIENT HAS TOLERATED Cephalosporins    Lynwood Mckusick, PharmD, BCPS Clinical Pharmacist Phone: [phone]

## 2024-03-22 NOTE — Hospital Course (Addendum)
 73 y.o. F with MO, BrCA metastatic to liver and bone for many years, now in remission on Herceptin , dCHF, and hx PE on Eliquis , recently admitted for septic arthritis of the SI joint, then readmitted with vomiting, weakness, now readmitted a second time with encephalopathy, lactic acidosis.

## 2024-03-22 NOTE — Assessment & Plan Note (Addendum)
 Eosinophilia LFTs normal, no LNs on CTA.  No fever.   - Follow diff - Follow smear - Stop daptomycin , Rocephin  - Consult ID
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
## 2024-03-22 NOTE — Assessment & Plan Note (Signed)
-   Statin held during the hospitalization and will be resumed on discharge.  

## 2024-03-22 NOTE — Assessment & Plan Note (Signed)
 Diagnosed in 2005, metastatic for many years to liver, bone. S/p chemo and radiation to the chest wall.  Has been on Herceptin .  Most recent CT surveillance shows no evidence of metastases anywhere. - Outpatient follow-up with oncology

## 2024-03-22 NOTE — Assessment & Plan Note (Signed)
 Volume status difficult to assess but not overloaded - Hold furosemide  given soft blood pressure

## 2024-03-22 NOTE — Progress Notes (Signed)
 Progress Note   Patient: [name] FMW:[id number] DOB: 21-Mar-1951 DOA: 03/21/2024     1 DOS: the patient was seen and examined on 03/22/2024 at 8:36AM      Brief hospital course: 73 y.o. F with MO, BrCA metastatic to liver and bone for many years, now in remission on Herceptin , dCHF, and hx PE on Eliquis , recently admitted for septic arthritis of the SI joint, then readmitted with vomiting, weakness, now readmitted a second time with encephalopathy, lactic acidosis.      Assessment and Plan: * Acute metabolic encephalopathy Lactic acidosis Generalized weakness Patient discharged 5d PTA after vomiting/weakness episode.  Had been close to functional baseline (mostly in bed, but able to transfer and walk with walker) until day of admission.  Of note, had a new red rash on trunk/extremities, thought to be due to Rocephin , and ID had just changed to Ertapenem .  On day of admission, was at baseline, DIL was helping clean her up before [facility] RN came over for first ertapenem  shot, she got up to go to the bathroom and walked there herself, but didn't come out, so daughter went in, found her slumped on the toilet, was groggy and in and out of responsiveness; couldn't stand or sit up.  No seizure-like activity, no falling off toilet.  EMS activated, paramedics lifted her off toilet and brought to the [facility] ER.  There, CTA chest normal.  Lactic acid >9, procal 0.84, eosinophils elevated on Diff.  Started on vancomycin  and Zosyn and transferred to [facility].   Here, Cr 1.3, procal 3.2.  BNP, ammonia, CK and TSH normal. Abdomen benign. No respiratory symptoms, albuterol  use, liver disease.   I really don't have an explanation for this degree of lactic acid elevation.  Anemia of chronic disease Hgb stable, no clinical bleeding  Had been admitted last may for hematemesis, EGD unremarkable.  Readmitted last week for vomiting again and esophageal thickening on imaging, work up unremarkable.  Now resolved  on CT chest.  Hypomagnesemia - Supplement magnesium   Hypokalemia - Supplement potassium  History of pulmonary embolism - Continue home Eliquis   Obesity, Class III, BMI 40-49.9 (morbid obesity) BMI 41.8, complicates care  Breast cancer metastasized to multiple sites [facility]) Diagnosed in 2005, metastatic for many years to liver, bone. S/p chemo and radiation to the chest wall.  Has been on Herceptin .  Most recent CT surveillance shows no evidence of metastases anywhere. - Outpatient follow-up with oncology  Essential hypertension - Hold amlodipine , carvedilol , furosemide , losartan , spironolactone   Septic arthritis of sacroiliac joint (HCC) Group B strep bacteremia - Consult ID - Continue ertapenem  - Change to linezolid   Drug rash Eosinophilia LFTs normal, no LNs on CTA.  No fever.   - Follow diff - Follow smear - Stop daptomycin , Rocephin  - Consult ID   Hyponatremia Mild, asymptomatic  GAD (generalized anxiety disorder) - Hold Xanax , duloxetine   AKI (acute kidney injury) (HCC) Creatinine 1.3 on admission up from baseline 0.8.  Improving with fluids. - Hold losartan , diuretics - Okay to hold further fluids  HLD (hyperlipidemia) - Hold Crestor  for now  Chronic diastolic CHF (congestive heart failure) (HCC) Volume status difficult to assess but not overloaded - Hold furosemide  given soft blood pressure          Subjective: Patient has mild soreness, she has generalized weakness.  Her rash itches.  She has no confusion, seizures, fever overnight.     Physical Exam: BP 103/69 (BP Location: Right Wrist)   Pulse (!) 136  Temp 98.9 F (37.2 C) (Oral)   Resp 16   Ht 5' 3 (1.6 m)   Wt 107.2 kg   SpO2 100%   BMI 41.86 kg/m   Obese adult female, lying in bed, appears weak and tired RRR, no murmurs but exam limited by body habitus, no peripheral edema Respiratory rate normal, lungs diminished due to body habitus but no rales or wheezes Abdomen soft,  no tenderness to palpation in all quadrants, no guarding at all Attention normal, affect appropriate, oriented x 3, no tremor, face symmetric, speech fluent, generalized weakness but symmetric strength in all 4 extremities 4/5 in both lower extremities, 5 -/5 in upper extremities bilaterally    Data Reviewed: Basic metabolic panel notable for mild hyponatremia, mild hypokalemia, improving renal function CBC notable for mild anemia, slight leukocytosis Imaging reports reviewed and sumarized above    Family Communication: Daughter by phone    Disposition: Status is: Inpatient         Author: Lonni SHAUNNA Dalton, MD 03/22/2024 2:36 PM  For on call review [email or web address].

## 2024-03-22 NOTE — Assessment & Plan Note (Signed)
 Creatinine 1.3 on admission up from baseline 0.8.  Improving with fluids. - Hold losartan , diuretics - Okay to hold further fluids

## 2024-03-22 NOTE — Procedures (Signed)
 Patient Name: [name]  MRN: [id number]  Epilepsy Attending: Pastor Falling  Referring Physician/Provider:  Date: 03/22/2024 Duration: 22 ,minutes   Patient history: 73 year old man with altered mental status, EEG to assess for seizure  Level of alertness: Awake, drowsy  AEDs during EEG study: None   Technical aspects: This EEG study was done with scalp electrodes positioned according to the 10-20 International system of electrode placement. Electrical activity was reviewed with band pass filter of 1-70Hz , sensitivity of 7 uV/mm, display speed of 87mm/sec with a 60Hz  notched filter applied as appropriate. EEG data were recorded continuously and digitally stored.  Video monitoring was available and reviewed as appropriate.  Description: The posterior dominant rhythm consists of 6-8 Hz activity of moderate voltage (25-35 uV) seen predominantly in posterior head regions, symmetric and reactive to eye opening and eye closing. Drowsiness was characterized by attenuation of the posterior background rhythm. Hyperventilation and photic stimulation were not performed.     ABNORMALITY - Continuous slow, generalized   IMPRESSION: This study is suggestive of mild diffuse encephalopathy, nonspecific etiology but likely related to sedation, toxic-metabolic etiology. No seizures or epileptiform discharges were seen throughout the recording.    Pastor Falling MD Neurology

## 2024-03-22 NOTE — Assessment & Plan Note (Signed)
 Mild, asymptomatic.

## 2024-03-22 NOTE — Assessment & Plan Note (Signed)
 Continue home Eliquis

## 2024-03-22 NOTE — Assessment & Plan Note (Signed)
 BMI 41.8, complicates care

## 2024-03-22 NOTE — Progress Notes (Signed)
EEG complete. Results pending.  ?

## 2024-03-22 NOTE — Assessment & Plan Note (Signed)
-   Supplement potassium

## 2024-03-22 NOTE — Assessment & Plan Note (Signed)
-   Hold amlodipine , carvedilol , furosemide , losartan , spironolactone

## 2024-03-22 NOTE — Assessment & Plan Note (Signed)
 Lactic acidosis Generalized weakness Patient discharged 5d PTA after vomiting/weakness episode.  Had been close to functional baseline (mostly in bed, but able to transfer and walk with walker) until day of admission.  Of note, had a new red rash on trunk/extremities, thought to be due to Rocephin , and ID had just changed to Ertapenem .  On day of admission, was at baseline, DIL was helping clean her up before [facility] RN came over for first ertapenem  shot, she got up to go to the bathroom and walked there herself, but didn't come out, so daughter went in, found her slumped on the toilet, was groggy and in and out of responsiveness; couldn't stand or sit up.  No seizure-like activity, no falling off toilet.  EMS activated, paramedics lifted her off toilet and brought to the [facility] ER.  There, CTA chest normal.  Lactic acid >9, procal 0.84, eosinophils elevated on Diff.  Started on vancomycin  and Zosyn and transferred to [name].   Here, Cr 1.3, procal 3.2.  BNP, ammonia, CK and TSH normal. Abdomen benign. No respiratory symptoms, albuterol  use, liver disease.   I really don't have an explanation for this degree of lactic acid elevation.

## 2024-03-22 NOTE — Assessment & Plan Note (Signed)
-   Hold Xanax , duloxetine

## 2024-03-23 ENCOUNTER — Inpatient Hospital Stay: Payer: Self-pay | Admitting: Infectious Diseases

## 2024-03-23 ENCOUNTER — Other Ambulatory Visit (HOSPITAL_COMMUNITY): Payer: Self-pay

## 2024-03-23 DIAGNOSIS — G9340 Encephalopathy, unspecified: Secondary | ICD-10-CM | POA: Diagnosis not present

## 2024-03-23 DIAGNOSIS — I5030 Unspecified diastolic (congestive) heart failure: Secondary | ICD-10-CM

## 2024-03-23 DIAGNOSIS — I4719 Other supraventricular tachycardia: Secondary | ICD-10-CM | POA: Insufficient documentation

## 2024-03-23 LAB — COMPREHENSIVE METABOLIC PANEL WITH GFR
ALT: 17 U/L (ref 0–44)
AST: 18 U/L (ref 15–41)
Albumin: 1.5 g/dL — ABNORMAL LOW (ref 3.5–5.0)
Alkaline Phosphatase: 84 U/L (ref 38–126)
Anion gap: 10 (ref 5–15)
BUN: 19 mg/dL (ref 8–23)
CO2: 18 mmol/L — ABNORMAL LOW (ref 22–32)
Calcium: 7.7 mg/dL — ABNORMAL LOW (ref 8.9–10.3)
Chloride: 103 mmol/L (ref 98–111)
Creatinine, Ser: 1.08 mg/dL — ABNORMAL HIGH (ref 0.44–1.00)
GFR, Estimated: 55 mL/min — ABNORMAL LOW (ref >60)
Glucose, Bld: 96 mg/dL (ref 70–99)
Potassium: 4.1 mmol/L (ref 3.5–5.1)
Sodium: 131 mmol/L — ABNORMAL LOW (ref 135–145)
Total Bilirubin: 0.7 mg/dL (ref 0.0–1.2)
Total Protein: 4.1 g/dL — ABNORMAL LOW (ref 6.5–8.1)

## 2024-03-23 LAB — CBC
HCT: 29.7 % — ABNORMAL LOW (ref 36.0–46.0)
Hemoglobin: 10.8 g/dL — ABNORMAL LOW (ref 12.0–15.0)
MCH: 27.1 pg (ref 26.0–34.0)
MCHC: 36.4 g/dL — ABNORMAL HIGH (ref 30.0–36.0)
MCV: 74.4 fL — ABNORMAL LOW (ref 80.0–100.0)
Platelets: 153 K/uL (ref 150–400)
RBC: 3.99 MIL/uL (ref 3.87–5.11)
RDW: 16.7 % — ABNORMAL HIGH (ref 11.5–15.5)
WBC: 15.4 K/uL — ABNORMAL HIGH (ref 4.0–10.5)
nRBC: 0 % (ref 0.0–0.2)

## 2024-03-23 LAB — LACTIC ACID, PLASMA: Lactic Acid, Venous: 1.8 mmol/L (ref 0.5–1.9)

## 2024-03-23 LAB — PATHOLOGIST SMEAR REVIEW

## 2024-03-23 MED ORDER — DILTIAZEM HCL 25 MG/5ML IV SOLN
10.0000 mg | Freq: Once | INTRAVENOUS | Status: AC
  Administered 2024-03-23: 10 mg via INTRAVENOUS
  Filled 2024-03-23: qty 5

## 2024-03-23 MED ORDER — DOXYCYCLINE HYCLATE 100 MG PO TABS
100.0000 mg | ORAL_TABLET | Freq: Two times a day (BID) | ORAL | Status: DC
  Administered 2024-03-23 – 2024-03-29 (×12): 100 mg via ORAL
  Filled 2024-03-23 (×12): qty 1

## 2024-03-23 MED ORDER — ERTAPENEM IV (FOR PTA / DISCHARGE USE ONLY): 1.0000 g | INTRAVENOUS | 0 refills | Status: AC

## 2024-03-23 MED ORDER — ONDANSETRON HCL 4 MG/2ML IJ SOLN
4.0000 mg | Freq: Four times a day (QID) | INTRAMUSCULAR | Status: DC | PRN
  Administered 2024-03-23: 4 mg via INTRAVENOUS
  Filled 2024-03-23: qty 2

## 2024-03-23 MED ORDER — AMIODARONE HCL 200 MG PO TABS
200.0000 mg | ORAL_TABLET | Freq: Two times a day (BID) | ORAL | Status: DC
  Administered 2024-03-23 – 2024-03-29 (×13): 200 mg via ORAL
  Filled 2024-03-23 (×13): qty 1

## 2024-03-23 MED ORDER — DOXYCYCLINE HYCLATE 100 MG PO TABS
100.0000 mg | ORAL_TABLET | Freq: Two times a day (BID) | ORAL | 0 refills | Status: AC
  Filled 2024-03-23: qty 14, 7d supply, fill #0

## 2024-03-23 NOTE — TOC Initial Note (Signed)
 Transition of Care [facility]) - Initial/Assessment Note    Patient Details  Name: [name] MRN: [id number] Date of Birth: Jan 22, 1951  Transition of Care [facility] CM/SW Contact:    Andrez JULIANNA George, RN Phone Number: 03/23/2024, 1:56 PM  Clinical Narrative:                  Pt is from home with her spouse. She says they are together all the time.  She manages her own medications and denies any issues.  Her spouse provides needed transportation.  Awaiting therapy evals.  IP Care Management following.  Expected Discharge Plan: Skilled Nursing Facility Barriers to Discharge: Continued Medical Work up   Patient Goals and CMS Choice            Expected Discharge Plan and Services   Discharge Planning Services: CM Consult   Living arrangements for the past 2 months: Single Family Home                                      Prior Living Arrangements/Services Living arrangements for the past 2 months: Single Family Home Lives with:: Spouse Patient language and need for interpreter reviewed:: Yes Do you feel safe going back to the place where you live?: Yes        Care giver support system in place?: Yes (comment) Current home services: DME (cane/ walker/ shower seat / BSC) Criminal Activity/Legal Involvement Pertinent to Current Situation/Hospitalization: No - Comment as needed  Activities of Daily Living   ADL Screening (condition at time of admission) Independently performs ADLs?: Yes (appropriate for developmental age) (walker/cane) Is the patient deaf or have difficulty hearing?: No Does the patient have difficulty seeing, even when wearing glasses/contacts?: No Does the patient have difficulty concentrating, remembering, or making decisions?: No  Permission Sought/Granted                  Emotional Assessment Appearance:: Appears stated age Attitude/Demeanor/Rapport: Engaged Affect (typically observed): Accepting Orientation: : Oriented to Self,  Oriented to Place, Oriented to  Time, Oriented to Situation   Psych Involvement: No (comment)  Admission diagnosis:  Acute encephalopathy [G93.40] Patient Active Problem List   Diagnosis Date Noted   Narrow complex tachycardia (HCC) 03/23/2024   Lactic acidosis 03/22/2024   AKI (acute kidney injury) (HCC) 03/22/2024   GAD (generalized anxiety disorder) 03/22/2024   Hyponatremia 03/22/2024   Acute metabolic encephalopathy 03/21/2024   Drug rash 03/20/2024   Medication management 03/20/2024   PICC (peripherally inserted central catheter) in place 03/20/2024   Septic arthritis of sacroiliac joint (HCC) 03/20/2024   Intractable nausea and vomiting 03/13/2024   Esophagitis 03/13/2024   Urinary frequency 03/13/2024   Diarrhea 03/13/2024   SIRS (systemic inflammatory response syndrome) (HCC) 03/13/2024   HLD (hyperlipidemia) 02/18/2024   Bacteremia due to group B Streptococcus 02/17/2024   Septic arthritis (HCC) 02/14/2024   Osteomyelitis (HCC) 02/14/2024   Abscess of left iliac fossa 02/13/2024   Hypomagnesemia 01/20/2024   Hematemesis 01/19/2024   History of pulmonary embolism 01/19/2024   Chronic anticoagulation 01/19/2024   DNR (do not resuscitate).DNI(Do Not Intubate) 01/19/2024   Hypokalemia 01/19/2024   Acute metabolic acidosis 01/19/2024   Goals of care, counseling/discussion 08/13/2019   Port-A-Cath in place 08/14/2018   Obesity, Class III, BMI 40-49.9 (morbid obesity) 09/22/2015   Malignant neoplasm of both breasts in female, estrogen receptor negative (HCC) 09/22/2015   Coagulopathy (  HCC) 09/22/2015   Peripheral neuropathy 09/22/2015   Hot flashes 03/24/2015   Right shoulder pain 06/12/2013   Neck pain 06/12/2013   Anemia of chronic disease 03/21/2013   Breast cancer metastasized to multiple sites [facility]) 02/26/2013   Chronic diastolic CHF (congestive heart failure) (HCC) 09/11/2012   Neuropathy 09/05/2012   Essential hypertension 09/05/2012   Anxiety 09/05/2012    PCP:  Jefferey Fitch, MD Pharmacy:   [facility] [phone] [place], [place] - [address] AT [facility] [address] [place] [address] Phone: [phone] Fax: [phone]  [facility] [address] [place] [address] Phone: [phone] Fax: [phone]     Social Drivers of Health (SDOH) Social History: SDOH Screenings   Food Insecurity: No Food Insecurity (03/22/2024)  Housing: Low Risk  (03/22/2024)  Transportation Needs: No Transportation Needs (03/22/2024)  Utilities: Not At Risk (03/22/2024)  Depression (PHQ2-9): Low Risk  (03/19/2024)  Social Connections: Moderately Integrated (03/22/2024)  Recent Concern: Social Connections - Moderately Isolated (02/14/2024)  Tobacco Use: Low Risk  (03/21/2024)   SDOH Interventions: Food Insecurity Interventions: Intervention Not Indicated Housing Interventions: Intervention Not Indicated Transportation Interventions: Intervention Not Indicated, Patient Resources (Friends/Family) Utilities Interventions: Intervention Not Indicated Social Connections Interventions: Intervention Not Indicated   Readmission Risk Interventions    03/16/2024    4:38 PM 02/14/2024   12:50 PM 01/21/2024    1:47 PM  Readmission Risk Prevention Plan  Transportation Screening Complete Complete Complete  PCP or Specialist Appt within 3-5 Days   Complete  HRI or Home Care Consult   Complete  Social Work Consult for Recovery Care Planning/Counseling   Complete  Palliative Care Screening   Not Applicable  Medication Review [name]) Complete Complete Complete  PCP or Specialist appointment within 3-5 days of discharge Complete Complete   HRI or Home Care Consult Complete Complete   SW Recovery Care/Counseling Consult Not Complete    Palliative Care Screening Not Applicable    Skilled Nursing Facility Not Applicable

## 2024-03-23 NOTE — Plan of Care (Signed)
  Problem: Health Behavior/Discharge Planning: Goal: Ability to manage health-related needs will improve Outcome: Progressing   Problem: Clinical Measurements: Goal: Will remain free from infection Outcome: Progressing   Problem: Clinical Measurements: Goal: Diagnostic test results will improve Outcome: Progressing   Problem: Activity: Goal: Risk for activity intolerance will decrease Outcome: Progressing   Problem: Elimination: Goal: Will not experience complications related to urinary retention Outcome: Progressing   Problem: Pain Managment: Goal: General experience of comfort will improve and/or be controlled Outcome: Progressing   Problem: Safety: Goal: Ability to remain free from injury will improve Outcome: Progressing   Problem: Skin Integrity: Goal: Risk for impaired skin integrity will decrease Outcome: Progressing

## 2024-03-23 NOTE — Progress Notes (Signed)
 PT Cancellation Note  Patient Details Name: [name] MRN: [id number] DOB: 08/06/1951   Cancelled Treatment:    Reason Eval/Treat Not Completed: Other (comment)  RN reports HR in 140s, just given medication and SBP dropped to 90s. Requested PT hold evaluation at this time.  Will follow-up for PT evaluation as schedule allows, likely tomorrow AM. If urgent assessment needed please secure chat myself or MCPT.  Leontine Roads, PT, DPT [facility]  Rehabilitation Services Physical Therapist Office: [phone] Website: [email or web address]  Leontine GORMAN Roads 03/23/2024, 1:12 PM

## 2024-03-23 NOTE — Progress Notes (Signed)
 PHARMACY CONSULT NOTE FOR:  OUTPATIENT  PARENTERAL ANTIBIOTIC THERAPY (OPAT)  Indication: L-iliac fossa abscess Regimen: Ertapenem  1 gm every 24 hours + Doxycycline  100 mg BID  End date: 03/30/24  IV antibiotic discharge orders are pended. To discharging provider:  please sign these orders via discharge navigator,  Select New Orders & click on the button choice - Manage This Unsigned Work.     Thank you for allowing pharmacy to be a part of this patient's care.  Damien Quiet, PharmD, BCPS, BCIDP Infectious Diseases Clinical Pharmacist Phone: [phone] 03/23/2024, 4:06 PM

## 2024-03-23 NOTE — Plan of Care (Signed)

## 2024-03-23 NOTE — Progress Notes (Signed)
 NSG 0700-1900: At approx 1115, pt HR high 130's-140's, sustained.  MD notified, evaluated pt at the bedside.  EKG obtained and pt placed on bedside monitor.  IV bolus of cardizem  given as ordered with good effect; HR high 90's to low 100's for the rest of this RN's shift.  BP soft, frequent monitoring in place.  Pt not very interactive, flat affect.  PT's husband at the bedside majority of the day.  This RN was called to pt's room at approx 1830, daughter is concerned that pt is more confused.  Pt actually more talkative than earlier and answering questions and following commands appropriately.  Only neuro change noted on assessment was the disoriented to place, time, and situation.    At approx 1900, daughter asked to speak to this RN as she is concerned about pt being Dc'd home.  PT had refused STR in the past, and daughter states that pt cancels appointments with HH/PT.  Per daughter, she cleaned up pt's room PTA and found numerous medications spilled on the floor and scattered in the bedroom.  Daughter then set pt up with a pill box, but she is still concerned about pt being in charge of her med administration at discharge.  Encouraged daughter to discuss concerns with CM.  PT with decrease PO intake throughout the day.  She did not want any meals today, encouraged PO fluids.  Offered protein shakes, pt refused.  This RN brought in Boost breeze, but pt did not like it.  Spoke with family about bringing in foods/drinks that pt likes from home to help increase her PO intake.  PT had not voided, bladder scan done at approx 1900 by NT (>341ml see documentation).    Shift report given to HS nurse, Joy, including the bladder scan results.  RN to follow.

## 2024-03-23 NOTE — Assessment & Plan Note (Signed)
 Developed new narrow complex tachycardia today.  Resolved with diltiazem . - Consult Cardiology

## 2024-03-23 NOTE — Consult Note (Addendum)
 Advanced Heart Failure Team Consult Note   Primary Physician: Jefferey Fitch, MD Cardiologist:  None  Reason for Consultation: tachyarrhythmia   HPI:    [name] is seen today for evaluation of tachyarrhythmia at the request of Dr. Jonel, triad hospitalist.   [name] is a 73 y.o. with a history of metastatic HER-2 positive breast carcinoma originally diagnosed September 2004. Started in R breast. Underwent neo-adjuvant chemo. During this time developed L breast CA. Underwent bilateral mastectomy. Lymph nodes +. Subsequently developed SVC syndrome with extensive right-sided clot - placed on coumadin .   She received a total of 6 cycles of Taxotere/Carbo/Herceptin , completed in April 2005, after which she began single agent Herceptin , given every 4 weeks now. Plan to continue on Herceptin  indefinitely.    Coronary CTA in 8/20 showed no significant CAD, calcium  score 0.    Echo 3/25: EF 55-60% with mild LVH, GLS -19.4%, normal RV.   Admitted 5/25 with hematemesis. Xarelto  held but restarted at discharge. EGD with no acute findings.   Admitted 6/25 with abscess of left iliac fossa, group B strep bacteremia and sacral osteomyelitis. Seen by ID and discharged on 6 weeks of IV abx.   [name] presented to [place] ED with 1 day of somnolence, AMS and confusion. After receiving vanc she was found to have pruritic erythematous rash around her neck and trunk. Initial lactic acid was 8 and procal 0.84. Decision made to transfer to [facility]. Currently on ertapenem  and linezolid . Today she developed a tachyarrhythmia, she feels palpitations at rest. HR was in the 140s, broke with diltiazem . Reports compliance with medications at home.    Family at bedside. Patient resting comfortably in bed. Denies CP and SOB.  Home Medications Prior to Admission medications   Medication Sig Start Date End Date Taking? Authorizing Provider  acetaminophen  (TYLENOL ) 500 MG tablet Take 500-1,000  mg by mouth every 6 (six) hours as needed for mild pain (pain score 1-3), fever or moderate pain (pain score 4-6).   Yes [provider]  albuterol  (VENTOLIN  HFA) 108 (90 Base) MCG/ACT inhaler Inhale 2 puffs into the lungs every 6 (six) hours as needed for wheezing. 06/28/22  Yes Gudena, Vinay, MD  ALPRAZolam  (XANAX ) 1 MG tablet Take 1 tablet (1 mg total) by mouth 3 (three) times daily as needed for anxiety. 12/31/23  Yes Gudena, Vinay, MD  amLODipine  (NORVASC ) 10 MG tablet Take 1 tablet (10 mg total) by mouth every morning. 09/10/19  Yes Magrinat, Sandria BROCKS, MD  apixaban  (ELIQUIS ) 5 MG TABS tablet Take 1 tablet (5 mg total) by mouth 2 (two) times daily. 08/06/23  Yes Odean Potts, MD  CALCIUM  PO Take 2 tablets by mouth daily.   Yes [provider]  carvedilol  (COREG ) 12.5 MG tablet Take 1 tablet (12.5 mg total) by mouth 2 (two) times daily with a meal. 02/20/24  Yes Perri DELENA Meliton Mickey., MD  cefTRIAXone  (ROCEPHIN ) IVPB Inject 2 g into the vein daily. Indication:  L-iliac fossa abscess First Dose: Yes Last Day of Therapy:  03/30/24 Labs - Once weekly:  CBC/D and BMP, Labs - Once weekly- ESR and CRP Method of administration: IV Push Method of administration may be changed at the discretion of home infusion pharmacist based upon assessment of the patient and/or caregiver's ability to self-administer the medication ordered. 03/17/24 04/25/24 Yes Gherghe, Costin M, MD  daptomycin  (CUBICIN ) IVPB Inject 700 mg into the vein daily. Indication:  L-iliac fossa abscess First Dose: Yes Last  Day of Therapy:  03/30/24 Labs - Once weekly:  CBC/D, BMP, and CPK Labs - Once weekly- ESR and CRP Method of administration: IV Push Method of administration may be changed at the discretion of home infusion pharmacist based upon assessment of the patient and/or caregiver's ability to self-administer the medication ordered. 03/17/24 04/25/24 Yes Gherghe, Costin M, MD  DULoxetine  (CYMBALTA ) 60 MG capsule Take 60  mg by mouth daily. 12/17/22  Yes [provider]  ertapenem  (INVANZ ) 1 g injection Inject 1 g into the muscle daily. 03/20/24  Yes [provider]  furosemide  (LASIX ) 40 MG tablet Take 40 mg by mouth as needed for fluid or edema.   Yes [provider]  losartan  (COZAAR ) 100 MG tablet Take 100 mg by mouth every morning.   Yes [provider]  ondansetron  (ZOFRAN ) 8 MG tablet Take 1 tablet (8 mg total) by mouth every 8 (eight) hours as needed for nausea or vomiting. 01/21/24  Yes Laurence Locus, DO  Oxycodone  HCl 10 MG TABS Take 10 mg by mouth every 6 (six) hours as needed (Pain). 01/24/24  Yes [provider]  pantoprazole  (PROTONIX ) 40 MG tablet Take 1 tablet (40 mg total) by mouth daily. Patient taking differently: Take 40 mg by mouth daily as needed (acid reflux). 01/21/24 03/22/24 Yes Laurence Locus, DO  predniSONE (DELTASONE) 5 MG tablet Take 5 mg by mouth daily as needed (inflammation).   Yes [provider]  rosuvastatin  (CRESTOR ) 10 MG tablet Take 1 tablet (10 mg total) by mouth daily. 11/14/23 11/13/24 Yes Rolan Ezra RAMAN, MD  temazepam  (RESTORIL ) 30 MG capsule Take 1 capsule (30 mg total) by mouth at bedtime. Patient taking differently: Take 30 mg by mouth at bedtime as needed for sleep. 01/28/24  Yes Causey, Lindsey Cornetto, NP  cyclobenzaprine  (FLEXERIL ) 5 MG tablet Take 1 tablet (5 mg total) by mouth 2 (two) times daily as needed for muscle spasms. Patient not taking: Reported on 03/20/2024 12/31/23   Gudena, Vinay, MD  gabapentin  (NEURONTIN ) 300 MG capsule TAKE 2 CAPSULES BY MOUTH THREE TIMES DAILY Patient taking differently: Take 600 mg by mouth 3 (three) times daily. 04/15/23   Odean Potts, MD  spironolactone  (ALDACTONE ) 25 MG tablet Take 1 tablet daily Patient not taking: Reported on 03/13/2024 01/24/24   Rolan Ezra RAMAN, MD    Past Medical History: Past Medical History:  Diagnosis Date   Anemia    pt pt report   Anxiety    Bacteremia due  to group B Streptococcus    June '25   Breast cancer (HCC)    mets to liver and lung   Breast cancer metastasized to multiple sites [facility]) 02/26/2013   Cellulitis    CHF (congestive heart failure) (HCC)    History of chemotherapy 09/2004   taxotere/herceptin /carboplatin   Hypertension    Neuropathy    Osteomyelitis of sacrum (HCC)    Radiation 07/31/2006   left upper chest   Radiation 06/17/2006-06/27/2006   6480 cGy bilat. chest wall   Septic arthritis (HCC)    left SI joint   SVC syndrome    Thrombosis     Past Surgical History: Past Surgical History:  Procedure Laterality Date   ANKLE SURGERY Left    BACK SURGERY     CHOLECYSTECTOMY  08/28/1987   ESOPHAGOGASTRODUODENOSCOPY Left 01/21/2024   Procedure: EGD (ESOPHAGOGASTRODUODENOSCOPY);  Surgeon: Burnette Fallow, MD;  Location: [facility];  Service: Gastroenterology;  Laterality: Left;   MASS EXCISION Left 05/10/2022   Procedure:  EXCISION OF LEFT CHEST WALL MASS;  Surgeon: Vanderbilt Ned, MD;  Location: [facility] OR;  Service: General;  Laterality: Left;   MASTECTOMY Bilateral    w/ lymph node removal per patient   PERIPHERALLY INSERTED CENTRAL CATHETER INSERTION     PICC LINE INSERTION     PICC LINE REMOVAL ([facility] HX)     SURGICAL EXCISION OF EXCESSIVE SKIN     TUBAL LIGATION  08/27/1984    Family History: Family History  Problem Relation Age of Onset   Heart failure Father    Cancer Father        Prostate cancer   Heart failure Brother    Cancer Brother        Prostate cancer   Diabetes Maternal Aunt     Social History: Social History   Socioeconomic History   Marital status: Married    Spouse name: Not on file   Number of children: Not on file   Years of education: Not on file   Highest education level: Not on file  Occupational History   Not on file  Tobacco Use   Smoking status: Never    Passive exposure: Never   Smokeless tobacco: Never  Vaping Use   Vaping status: Never Used  Substance and Sexual  Activity   Alcohol  use: Yes    Comment: occasional- once a month   Drug use: No   Sexual activity: Yes    Birth control/protection: Post-menopausal  Other Topics Concern   Not on file  Social History Narrative   Not on file   Social Drivers of Health   Financial Resource Strain: Not on file  Food Insecurity: No Food Insecurity (03/22/2024)   Hunger Vital Sign    Worried About Running Out of Food in the Last Year: Never true    Ran Out of Food in the Last Year: Never true  Transportation Needs: No Transportation Needs (03/22/2024)   PRAPARE - Administrator, Civil Service (Medical): No    Lack of Transportation (Non-Medical): No  Physical Activity: Not on file  Stress: Not on file  Social Connections: Moderately Integrated (03/22/2024)   Social Connection and Isolation Panel    Frequency of Communication with Friends and Family: More than three times a week    Frequency of Social Gatherings with Friends and Family: More than three times a week    Attends Religious Services: Never    Database administrator or Organizations: No    Attends Engineer, structural: 1 to 4 times per year    Marital Status: Married  Recent Concern: Social Connections - Moderately Isolated (02/14/2024)   Social Connection and Isolation Panel    Frequency of Communication with Friends and Family: Never    Frequency of Social Gatherings with Friends and Family: Never    Attends Religious Services: Never    Database administrator or Organizations: No    Attends Engineer, structural: More than 4 times per year    Marital Status: Married    Allergies:  Allergies  Allergen Reactions   Penicillins Hives and Other (See Comments)    PATIENT HAS TOLERATED Cephalosporins   Adhesive [Tape] Other (See Comments)    Tears the skin    Ceftriaxone  Rash    Concern for drug rash noted 03/20/24   Daptomycin  Rash    Vague concern for drug rash as patient was on ceftriaxone  and daptomycin   at the same time    Objective:  Vital Signs:   Temp:  [97.7 F (36.5 C)-98.5 F (36.9 C)] 97.9 F (36.6 C) (07/28 1152) Pulse Rate:  [98-137] 137 (07/28 1152) Resp:  [12-18] 14 (07/28 1312) BP: (92-108)/(59-89) 100/74 (07/28 1312) SpO2:  [98 %-100 %] 100 % (07/28 1312) Weight:  [108 kg] 108 kg (07/28 0500) Last BM Date : 03/22/24  Weight change: Filed Weights   03/21/24 2310 03/23/24 0500  Weight: 107.2 kg 108 kg    Intake/Output:   Intake/Output Summary (Last 24 hours) at 03/23/2024 1316 Last data filed at 03/22/2024 2200 Gross per 24 hour  Intake 120 ml  Output --  Net 120 ml      Physical Exam  General:  elderly appearing.  Neck: JVD ~8 cm.  Cor: Regular rate & rhythm. No murmurs. Lungs: clear, diminished bases Extremities: non-pitting BLE edema  Neuro: alert & oriented x 3, delayed responses. Affect flat.   Telemetry   NSR 80s-90s  EKG    NSR with 1dAVB  Labs   Basic Metabolic Panel: Recent Labs  Lab 03/17/24 0343 03/22/24 0012 03/22/24 0500 03/23/24 0740  NA 132* 132* 133* 131*  K 3.8 3.7 3.3* 4.1  CL 102 104 106 103  CO2 19* 18* 18* 18*  GLUCOSE 81 88 90 96  BUN 5* 22 20 19   CREATININE 0.91 1.36* 1.19* 1.08*  CALCIUM  7.6* 7.2* 7.1* 7.7*  MG 1.5* 1.6* 1.9  --     Liver Function Tests: Recent Labs  Lab 03/17/24 0343 03/22/24 0500 03/23/24 0740  AST 37 21 18  ALT 33 18 17  ALKPHOS 94 83 84  BILITOT 1.3* 0.9 0.7  PROT 5.1* 3.9* 4.1*  ALBUMIN  2.0* 1.5* 1.5*   No results for input(s): LIPASE, AMYLASE in the last 168 hours. Recent Labs  Lab 03/22/24 0530  AMMONIA 19    CBC: Recent Labs  Lab 03/17/24 0343 03/22/24 0012 03/22/24 0500 03/23/24 0740  WBC 12.0* 11.8* 11.9* 15.4*  NEUTROABS  --  4.6 5.8  --   HGB 10.3* 11.1* 9.9* 10.8*  HCT 29.0* 30.4* 27.0* 29.7*  MCV 77.7* 75.1* 74.0* 74.4*  PLT 262 183 152 153    Cardiac Enzymes: Recent Labs  Lab 03/22/24 0500  CKTOTAL 158    BNP: BNP (last 3  results) Recent Labs    11/12/23 1428 03/13/24 0332 03/22/24 0530  BNP 64.6 53.7 31.2    ProBNP (last 3 results) No results for input(s): PROBNP in the last 8760 hours.   CBG: No results for input(s): GLUCAP in the last 168 hours.  Coagulation Studies: Recent Labs    03/22/24 0923  LABPROT 21.5*  INR 1.8*     Imaging   EEG adult Result Date: 03/22/2024 Gregg Lek, MD     03/22/2024  6:40 PM Patient Name: KAROLYN MESSING MRN: [id number] Epilepsy Attending: Lek Gregg Referring Physician/Provider: Date: 03/22/2024 Duration: 22 ,minutes Patient history: 73 year old man with altered mental status, EEG to assess for seizure Level of alertness: Awake, drowsy AEDs during EEG study: None Technical aspects: This EEG study was done with scalp electrodes positioned according to the 10-20 International system of electrode placement. Electrical activity was reviewed with band pass filter of 1-70Hz , sensitivity of 7 uV/mm, display speed of 59mm/sec with a 60Hz  notched filter applied as appropriate. EEG data were recorded continuously and digitally stored.  Video monitoring was available and reviewed as appropriate. Description: The posterior dominant rhythm consists of 6-8 Hz activity of moderate voltage (25-35 uV) seen predominantly  in posterior head regions, symmetric and reactive to eye opening and eye closing. Drowsiness was characterized by attenuation of the posterior background rhythm. Hyperventilation and photic stimulation were not performed.   ABNORMALITY - Continuous slow, generalized IMPRESSION: This study is suggestive of mild diffuse encephalopathy, nonspecific etiology but likely related to sedation, toxic-metabolic etiology. No seizures or epileptiform discharges were seen throughout the recording. Amadou Camara MD Neurology      Medications:     Current Medications:  apixaban   5 mg Oral BID   Chlorhexidine  Gluconate Cloth  6 each Topical Daily   hydrocortisone  cream    Topical BID    Infusions:  ertapenem  Stopped (03/23/24 1129)   linezolid  (ZYVOX ) IV Stopped (03/23/24 1129)      Patient Profile   Yolanda Stetler is a 73 y.o. with a history of metastatic HER-2 positive breast carcinoma originally diagnosed September 2004. AHF team to see for new tachyarrhythmia.   Assessment/Plan  Tachyarrhythmia  - Suspected junctional tachycardia with AV dissociation vs atrial tachycardia. Dr. Rolan reviewed with Dr. Nancey who suspected it to be atrial tachycardia.  - Broke with diltiazem  - On Coreg  at home, has not been on it this admission - BP soft, will start amiodarone  200 mg BID for rhythm stabilization. Once closer to discharge or if BP is stable can start Toprol -XL  - Currently in NSR/ST low 100s  Acute encephalopathy  Septic arthritis of sacroiliac joint (HCC) Group B strep bacteremia - Improving, pre primary team - Lactic acid pending. Last one was 3.2.  >9 on admission.  - ID to see - Continue abx, will complete 6 weeks soon  L breast cancer -  s/p bilateral mastectomies: Symptomatically stable. She will be continuing Herceptin  indefinitely.    HTN - BP stable.  - Plan for BB at discharge  Chronic diastolic heart failure - Does not appear volume overloaded on exam.  - On lasix  40 mg daily PRN at home, continue to follow for need. Does not appear to need any at this time.   SVC syndrome - Continue Eliquis  5 mg BID    Length of Stay: 2  Alma L Diaz, NP  03/23/2024, 1:16 PM    Advanced Heart Failure Team Pager [phone] (M-F; 7a - 5p)  Please contact [facility] for night-coverage after hours (4p -7a ) and weekends on [email or web address]   Patient seen with NP, I formulated the plan and agree with the above note.   Patient is followed in cardio-oncology clinic as outpatient for long-standing Herceptin  treatment in the setting of metastatic breast cancer.  Outpatient echoes have been stable. She has HFpEF and uses Lasix  prn at home.   In  6/25 admitted strep bacteremia and left SI joint septic arthritis, s/p aspiration by IR (02/14/24).  Had been on daptomycin /ceftriaxone  for 6 wks IV abx, developed rash and has been transitioned to linezolid /ertapenem .   She went to the ER at [facility] on 7/26 with confusion, altered mental status.  Lactate was initially 8, SBP 100s.  She was transferred to [facility].  Lactate has remained elevated, most recently 3.2.  SBP ranging 90s-100s, has not required pressors. Procalcitonin 3.5.  Creatinine stable at 1.08. Home Coreg  and amlodipine  have been held.   Patient was noted to have episodes of SVT rate 130s-140s.   Cardiology consulted with persistent lactic acidosis, SVT.   She denies dyspnea at rest.  Feels generally fatigued.   General: NAD Neck: Thick, JVP difficult but does not appear elevated, no thyromegaly or  thyroid nodule.  Lungs: Clear to auscultation bilaterally with normal respiratory effort. CV: Nondisplaced PMI.  Heart regular S1/S2, no S3/S4, no murmur.  Trace ankle edema.  Abdomen: Soft, nontender, no hepatosplenomegaly, no distention.  Skin: Intact without lesions or rashes.  Neurologic: Alert and oriented x 3.  Psych: Normal affect. Extremities: No clubbing or cyanosis.  HEENT: Normal.   1. Lactic acidosis: Persistent, initially 8, now 2.5.  SBP 90s-100s.  Mentating normally.  She has not been on pressors.  Echo recently showed EF 70-75% with normal RV. Creatinine stable 1.08.  Suspect lactic acidosis related to ongoing infection (has left sacro-iliac septic arthritis).  Procalcitonin elevated at 3.5, WBCs 15.  - ID following, antibiotics changed to linezolid /ertapenem .  - trend lactate.  - Off home amlodipine  and Coreg  2. HFpEF: Echo in 6/25 with stable EF 70-75%, normal RV.  She takes Lasix  prn at home.  She does not look markedly volume up today though exam is difficult. Can hold off on diuretic for now.  3. SVT: Runs of SVT, rate around 140.  I suspect that this is  atrial tachycardia. Currently in NSR. She had been on Coreg  12.5 mg bid at home, was stopped at admission.  Suspect rebound sympathetic tone has helped trigger AT.   - I am reticent to start her back on Coreg  with ongoing lactic acidosis.  Will start amiodarone  200 mg bid for now to suppress, can stop at discharge and get her back on her home Coreg .  4. Group B strep bacteremia with SI joint septic arthritis: Diagnosed in 6/25.  - Now on linezolid /ertapenem .  5. Metastatic breast cancer: Indefinite Herceptin  planned.  6. SVC syndrome: Long-standing diagnosis.  Continue Eliquis .   Ezra Shuck 03/23/2024 3:06 PM

## 2024-03-23 NOTE — Progress Notes (Signed)
 Progress Note   Patient: [name] FMW:[id number] DOB: [birth year] DOA: 03/21/2024     2 DOS: the patient was seen and examined on 03/23/2024 at 11:23AM      Brief hospital course: 73 y.o. F with MO, BrCA metastatic to liver and bone for many years, now in remission on Herceptin , dCHF, and hx PE on Eliquis , recently admitted for septic arthritis of the SI joint, then readmitted with vomiting, weakness, now readmitted a second time with encephalopathy, lactic acidosis.      Assessment and Plan: * Acute metabolic encephalopathy Lactic acidosis Generalized weakness See much longer summary from 7/27  Extensive work up here normal, very low suspicion her lactic acidosis was from infection.  Echo normal  Today went into narrow complex tachycardia and developed symptoms of poor responsiveness and anergy very similar to those described at admission.  No arrhythmia noted by EMS or in ED, but could have been paroxysma.l?  See below    Septic arthritis of sacroiliac joint (HCC) Group B strep bacteremia This is resolving, nearing end of treatment. - Continue linezolid  and ertapenem  - Consult ID   Narrow complex tachycardia (HCC) This was new.  Developed new narrow complex tachycardia today.  No prior history.  Has no significant coronary disease, no prior arrhythmias.  Prior treatment with Taxotere years ago and now long term herceiptin.  Episode today Resolved with diltiazem .  If this is truly the cause of her presenting lactic acidosis and symptoms, I am uncertain how to prevent it, as she is already on BB - Consult Cardiology for delineation of arrhythmia (junctional vs supravnetricular) and strategy to prevent   Drug rash Eosinophilia Rash better today.  LFTs remain normal.  no LNs on CTA.  No fever.   - Follow diff - Follow smear - Stop daptomycin , Rocephin  - Consult ID   Hyponatremia Mild, asymptomatic  GAD (generalized anxiety disorder) - Hold Xanax ,  duloxetine   AKI (acute kidney injury) (HCC) Creatinine 1.3 on admission up from baseline 0.8.  Improved with fluids, holding ACE/diuretics. - Hold losartan , diuretics  HLD (hyperlipidemia) - Hold Crestor  for now  Hypomagnesemia Supplemented  Hypokalemia Supplemented  History of pulmonary embolism - Continue home Eliquis   Obesity, Class III, BMI 40-49.9 (morbid obesity) BMI 41.8, complicates care  Anemia of chronic disease Hgb stable, no clinical bleeding  Had been admitted last may for hematemesis, EGD unremarkable.  Readmitted last week for vomiting again and esophageal thickening on imaging, work up unremarkable.  Now resolved on CT chest.  Breast cancer metastasized to multiple sites [facility]) Diagnosed in 2005, metastatic for many years to liver, bone. S/p chemo and radiation to the chest wall.  Has been on Herceptin .  Most recent CT surveillance shows no evidence of metastases anywhere. - Outpatient follow-up with oncology  Chronic diastolic CHF (congestive heart failure) (HCC) Volume status difficult to assess but not overloaded - Hold furosemide  given soft blood pressure  Essential hypertension - Hold amlodipine , carvedilol , furosemide , losartan , spironolactone           Subjective: Sluggish and poorly responsive after SVT.  Cardiology consulted, no chest pain, no dyspnea, no swelling, no fever.     Physical Exam: BP 100/74   Pulse (!) 137   Temp 97.9 F (36.6 C) (Oral)   Resp 14   Ht 5' 3 (1.6 m)   Wt 108 kg   SpO2 100%   BMI 42.18 kg/m   Morbidly obese adult female, , lying in bed, poorly responsive today but oriented to  place, situation Rachycaridc, regular, heart sounds hard to hear given habitus No swelling Respiraotry rate seems normal. Lung sounds diminished. Abdomen without TTP Attention decreased, weak, but oriented.  Severe weakness.  Left arm strength limited by joint pain.  Data Reviewed: Discussed with Cardiology BMP shows  hyponatermia Improving Cr CBC shows WBC up to 15K, Hgb stable at 10  ECG personally reviewed, narrow complex tachycardia   Family Communication: Daughter    Disposition: Status is: Inpatient         Author: Lonni SHAUNNA Dalton, MD 03/23/2024 1:25 PM  For on call review [email or web address].

## 2024-03-23 NOTE — Progress Notes (Signed)
 [facility]  Date of Admission:  03/21/2024     Reason for Follow Up: Acute encephalopathy  Total days of antibiotics 3         ASSESSMENT:  [name] is a 73 y/o AA female being treated for left SI joint septic arthritis and osteomyelitis s/p aspiration with daptomycin  and ceftriaxone  presenting to the hospital with itchy rash and confusion with concern for possible drug reaction.  Rash appears to have resolved with no further symptoms and tolerating linezolid  and ertapenem  with no adverse side effects. Given previous cultures had no growth will need to continue with broad spectrum coverage and will change linezolid  to doxycyline and continue current dose of ertapenem . No changes to end of treatment which will remain 03/30/24.  Continue port care per protocol.  Has follow up scheduled with Dr. Dea on 8/5. Ok for discharge from ID standpoint. Remaining medical and supportive care per Internal Medicine.    PLAN:  Continue Ertapenem  1 g IV q 24 through 8/5 as planned.  Change linezolid  to Doxycycline  100 mg PO BID through 04/01/23.  Follow up in ID clinic with Dr. Dea on 03/31/24 Ok for discharge from ID stanpoint.  Continue port care per protocol. Remaining medical and supportive care per Internal Medicine.  ID will sign off. Please re-consult if needed.   Diagnosis:  Left SI joint septic arthritis and osteomyelitis with associated fluid collection   Culture Result: No growth   Allergies  Allergen Reactions   Penicillins Hives and Other (See Comments)    PATIENT HAS TOLERATED Cephalosporins   Adhesive [Tape] Other (See Comments)    Tears the skin    Ceftriaxone  Rash    Concern for drug rash noted 03/20/24   Daptomycin  Rash    Vague concern for drug rash as patient was on ceftriaxone  and daptomycin  at the same time    OPAT Orders Discharge antibiotics to be given via PICC line Discharge antibiotics: Ertapenem  1 g IV q 24 and  Doxycycline  100 mg PO bid Per pharmacy protocol   Duration: 7 days End Date: 03/31/24  [facility] Care Per Protocol:  Home health RN for IV administration and teaching; PICC line care and labs.    Labs weekly while on IV antibiotics: _X_ CBC with differential _X_ BMP __ CMP _X_ CRP _X_ ESR __ Vancomycin  trough __ CK  __ Please pull PIC at completion of IV antibiotics _X_ Please leave PIC in place until doctor has seen patient or been notified  Fax weekly labs to [phone]  Clinic Follow Up Appt:  03/31/24 with Dr. Dea   Principal Problem:   Acute metabolic encephalopathy Active Problems:   Essential hypertension   Chronic diastolic CHF (congestive heart failure) (HCC)   Breast cancer metastasized to multiple sites (HCC)   Anemia of chronic disease   Obesity, Class III, BMI 40-49.9 (morbid obesity)   History of pulmonary embolism   Hypokalemia   Hypomagnesemia   Bacteremia due to group B Streptococcus   HLD (hyperlipidemia)   Drug rash   Septic arthritis of sacroiliac joint (HCC)   Lactic acidosis   AKI (acute kidney injury) (HCC)   GAD (generalized anxiety disorder)   Hyponatremia   Narrow complex tachycardia (HCC)    amiodarone   200 mg Oral BID   apixaban   5 mg Oral BID   Chlorhexidine  Gluconate Cloth  6 each Topical Daily   doxycycline   100 mg Oral Q12H   hydrocortisone  cream   Topical  BID    SUBJECTIVE:  Afebrile overnight with no acute events. Rash has improved and resolved with no further itching. Tolerating antibiotics with no adverse side effects. Husband at beside.   Allergies  Allergen Reactions   Penicillins Hives and Other (See Comments)    PATIENT HAS TOLERATED Cephalosporins   Adhesive [Tape] Other (See Comments)    Tears the skin    Ceftriaxone  Rash    Concern for drug rash noted 03/20/24   Daptomycin  Rash    Vague concern for drug rash as patient was on ceftriaxone  and daptomycin  at the same time     Review of  Systems: Review of Systems  Constitutional:  Negative for chills, fever and weight loss.  Respiratory:  Negative for cough, shortness of breath and wheezing.   Cardiovascular:  Negative for chest pain and leg swelling.  Gastrointestinal:  Negative for abdominal pain, constipation, diarrhea, nausea and vomiting.  Skin:  Negative for rash.      OBJECTIVE: Vitals:   03/23/24 1306 03/23/24 1311 03/23/24 1312 03/23/24 1559  BP: 94/72  100/74 106/77  Pulse:    (!) 102  Resp: 12 12 14 14   Temp:    98.2 F (36.8 C)  TempSrc:    Oral  SpO2:  100% 100% 99%  Weight:      Height:       Body mass index is 42.18 kg/m.  Physical Exam Constitutional:      General: She is not in acute distress.    Appearance: She is well-developed.  Cardiovascular:     Rate and Rhythm: Normal rate and regular rhythm.     Heart sounds: Normal heart sounds.  Pulmonary:     Effort: Pulmonary effort is normal.     Breath sounds: Normal breath sounds.  Skin:    General: Skin is warm and dry.  Neurological:     Mental Status: She is alert and oriented to person, place, and time.     Lab Results Lab Results  Component Value Date   WBC 15.4 (H) 03/23/2024   HGB 10.8 (L) 03/23/2024   HCT 29.7 (L) 03/23/2024   MCV 74.4 (L) 03/23/2024   PLT 153 03/23/2024    Lab Results  Component Value Date   CREATININE 1.08 (H) 03/23/2024   BUN 19 03/23/2024   NA 131 (L) 03/23/2024   K 4.1 03/23/2024   CL 103 03/23/2024   CO2 18 (L) 03/23/2024    Lab Results  Component Value Date   ALT 17 03/23/2024   AST 18 03/23/2024   ALKPHOS 84 03/23/2024   BILITOT 0.7 03/23/2024     Microbiology: No results found for this or any previous visit (from the past 240 hours).   Cathlyn July, NP [facility]  03/23/2024  4:11 PM

## 2024-03-24 ENCOUNTER — Inpatient Hospital Stay: Admitting: Nutrition

## 2024-03-24 ENCOUNTER — Inpatient Hospital Stay: Admitting: Hematology and Oncology

## 2024-03-24 ENCOUNTER — Inpatient Hospital Stay

## 2024-03-24 ENCOUNTER — Other Ambulatory Visit: Payer: Self-pay

## 2024-03-24 DIAGNOSIS — M009 Pyogenic arthritis, unspecified: Secondary | ICD-10-CM | POA: Diagnosis not present

## 2024-03-24 DIAGNOSIS — R21 Rash and other nonspecific skin eruption: Secondary | ICD-10-CM | POA: Diagnosis not present

## 2024-03-24 DIAGNOSIS — I5032 Chronic diastolic (congestive) heart failure: Secondary | ICD-10-CM | POA: Diagnosis not present

## 2024-03-24 DIAGNOSIS — G9340 Encephalopathy, unspecified: Secondary | ICD-10-CM | POA: Diagnosis not present

## 2024-03-24 LAB — COMPREHENSIVE METABOLIC PANEL WITH GFR
ALT: 16 U/L (ref 0–44)
AST: 16 U/L (ref 15–41)
Albumin: <1.5 g/dL — ABNORMAL LOW (ref 3.5–5.0)
Alkaline Phosphatase: 85 U/L (ref 38–126)
Anion gap: 9 (ref 5–15)
BUN: 20 mg/dL (ref 8–23)
CO2: 17 mmol/L — ABNORMAL LOW (ref 22–32)
Calcium: 7.3 mg/dL — ABNORMAL LOW (ref 8.9–10.3)
Chloride: 105 mmol/L (ref 98–111)
Creatinine, Ser: 0.97 mg/dL (ref 0.44–1.00)
GFR, Estimated: >60 mL/min (ref >60)
Glucose, Bld: 92 mg/dL (ref 70–99)
Potassium: 3.9 mmol/L (ref 3.5–5.1)
Sodium: 131 mmol/L — ABNORMAL LOW (ref 135–145)
Total Bilirubin: 1 mg/dL (ref 0.0–1.2)
Total Protein: 4 g/dL — ABNORMAL LOW (ref 6.5–8.1)

## 2024-03-24 LAB — CBC WITH DIFFERENTIAL/PLATELET
Abs Immature Granulocytes: 0.07 K/uL (ref 0.00–0.07)
Basophils Absolute: 0.1 K/uL (ref 0.0–0.1)
Basophils Relative: 1 %
Eosinophils Absolute: 4 K/uL — ABNORMAL HIGH (ref 0.0–0.5)
Eosinophils Relative: 27 %
HCT: 28.2 % — ABNORMAL LOW (ref 36.0–46.0)
Hemoglobin: 10.4 g/dL — ABNORMAL LOW (ref 12.0–15.0)
Immature Granulocytes: 1 %
Lymphocytes Relative: 22 %
Lymphs Abs: 3.2 K/uL (ref 0.7–4.0)
MCH: 27.6 pg (ref 26.0–34.0)
MCHC: 36.9 g/dL — ABNORMAL HIGH (ref 30.0–36.0)
MCV: 74.8 fL — ABNORMAL LOW (ref 80.0–100.0)
Monocytes Absolute: 1.3 K/uL — ABNORMAL HIGH (ref 0.1–1.0)
Monocytes Relative: 9 %
Neutro Abs: 6 K/uL (ref 1.7–7.7)
Neutrophils Relative %: 40 %
Platelets: 122 K/uL — ABNORMAL LOW (ref 150–400)
RBC: 3.77 MIL/uL — ABNORMAL LOW (ref 3.87–5.11)
RDW: 16.9 % — ABNORMAL HIGH (ref 11.5–15.5)
Smear Review: NORMAL
WBC: 14.6 K/uL — ABNORMAL HIGH (ref 4.0–10.5)
nRBC: 0 % (ref 0.0–0.2)

## 2024-03-24 MED ORDER — SODIUM CHLORIDE 0.9% FLUSH
10.0000 mL | Freq: Two times a day (BID) | INTRAVENOUS | Status: DC
  Administered 2024-03-24 – 2024-03-29 (×11): 10 mL

## 2024-03-24 MED ORDER — METOPROLOL SUCCINATE ER 25 MG PO TB24
25.0000 mg | ORAL_TABLET | Freq: Every day | ORAL | Status: DC
  Administered 2024-03-24: 25 mg via ORAL
  Filled 2024-03-24: qty 1

## 2024-03-24 MED ORDER — MUPIROCIN 2 % EX OINT: 1.0000 | TOPICAL_OINTMENT | Freq: Two times a day (BID) | CUTANEOUS | Status: DC

## 2024-03-24 NOTE — Progress Notes (Addendum)
 Advanced Heart Failure Rounding Note  Cardiologist: None  AHF MD: Dr. Rolan Chief Complaint: Tachyarrhythmia Patient Profile   [name] is a 73 y.o. with a history of metastatic HER-2 positive breast carcinoma originally diagnosed September 2004.   Subjective:    2 brief episodes of artrial tach with rates up to 130s overnight at 2300 and 0310. Predominantly NSR.  Lying in bed. Not very participatory in exam. No palpitations, SOB or CP overnight.   Objective:    Weight Range: 108.7 kg Body mass index is 42.45 kg/m.   Vital Signs:   Temp:  [97.9 F (36.6 C)-99.1 F (37.3 C)] 98.9 F (37.2 C) (07/29 0719) Pulse Rate:  [91-137] 98 (07/29 0719) Resp:  [0-18] 18 (07/29 0719) BP: (90-111)/(57-86) 107/60 (07/29 0719) SpO2:  [99 %-100 %] 100 % (07/29 0719) Weight:  [108.7 kg] 108.7 kg (07/29 0500) Last BM Date : 03/22/24  Weight change: Filed Weights   03/21/24 2310 03/23/24 0500 03/24/24 0500  Weight: 107.2 kg 108 kg 108.7 kg   Intake/Output:  Intake/Output Summary (Last 24 hours) at 03/24/2024 0831 Last data filed at 03/23/2024 2245 Gross per 24 hour  Intake 150 ml  Output 5 ml  Net 145 ml    Physical Exam    General: Chronically ill appearing. Cardiac: S1 and S2 present. No murmurs or rub. Extremities: Warm and dry.  1+ generalized edema.  Neuro: Dosing during exam. Slow to respond  Telemetry   SR in 90s with 2 brief episodes of AT up to 130s at 2300 and 0315 (personally reviewed)  EKG    No new EKG to review  Labs    CBC Recent Labs    03/22/24 0500 03/23/24 0740 03/24/24 0543  WBC 11.9* 15.4* 14.6*  NEUTROABS 5.8  --  6.0  HGB 9.9* 10.8* 10.4*  HCT 27.0* 29.7* 28.2*  MCV 74.0* 74.4* 74.8*  PLT 152 153 122*   Basic Metabolic Panel Recent Labs    92/72/74 0012 03/22/24 0500 03/23/24 0740 03/24/24 0543  NA 132* 133* 131* 131*  K 3.7 3.3* 4.1 3.9  CL 104 106 103 105  CO2 18* 18* 18* 17*  GLUCOSE 88 90 96 92  BUN 22 20 19 20    CREATININE 1.36* 1.19* 1.08* 0.97  CALCIUM  7.2* 7.1* 7.7* 7.3*  MG 1.6* 1.9  --   --    Liver Function Tests Recent Labs    03/23/24 0740 03/24/24 0543  AST 18 16  ALT 17 16  ALKPHOS 84 85  BILITOT 0.7 1.0  PROT 4.1* 4.0*  ALBUMIN  1.5* <1.5*   Cardiac Enzymes Recent Labs    03/22/24 0500  CKTOTAL 158   BNP (last 3 results) Recent Labs    11/12/23 1428 03/13/24 0332 03/22/24 0530  BNP 64.6 53.7 31.2   Thyroid Function Tests Recent Labs    03/22/24 0530  TSH 3.025   Medications:    Scheduled Medications:  amiodarone   200 mg Oral BID   apixaban   5 mg Oral BID   Chlorhexidine  Gluconate Cloth  6 each Topical Daily   doxycycline   100 mg Oral Q12H   hydrocortisone  cream   Topical BID   sodium chloride  flush  10-40 mL Intracatheter Q12H    Infusions:  ertapenem  Stopped (03/23/24 1129)    PRN Medications: acetaminophen  **OR** acetaminophen , diphenhydrAMINE , diphenhydrAMINE -zinc  acetate, melatonin, menthol -cetylpyridinium, ondansetron  (ZOFRAN ) IV, oxyCODONE   Assessment/Plan   Tachyarrhythmia  - Suspected junctional tachycardia with AV dissociation vs atrial tachycardia. Dr. Rolan reviewed  with Dr. Nancey who suspected it to be atrial tachycardia. Broke with Diltiazem . - TSH normal - Predominantly NSR on tele, with 2 brief episodes of AT overnight.  - Prev on coreg , held with LA, now cleared.  - Start Toprol - XL 25 mg daily - continue amio 200 mg bid - can place Zio at follow up visit to assess for AT burden    2. Lactic Acidosis 3. Acute encephalopathy  4. Septic arthritis of sacroiliac joint (HCC)  5. Group B strep bacteremia - septic arthritis disgnosed in 6/25. Started on linezolid /ertapenem  - LA 8 on admit, now clear at 1.8 - seen by ID, linezolid >doxycycline  (complete 03/30/24)   6. L breast cancer -  s/p bilateral mastectomies: Symptomatically stable. She will be continuing Herceptin  indefinitely.     7. HTN - BP stable.    8. Chronic  diastolic heart failure - Does not appear volume overloaded on exam.  - Continue PRN Lasix  40 mg daily. Does not need at this time. Takes weekly at home.   9. SVC syndrome - Continue Eliquis  5 mg bid  Heart failure team will sign off as of 03/24/24  HF Team Medication Recommendations for Home: - Toprol -XL 25 mg daily - Amiodarone  200 mg bid - Eliquis  5 mg bid - Lasix  40 mg PRN daily   She will have AHF Clinic follow up appointment.   Length of Stay: 3  [place] Lee, NP  03/24/2024, 8:31 AM  Advanced Heart Failure Team Pager [phone] (M-F; 7a - 5p)  Please contact [facility] for night-coverage after hours (5p -7a ) and weekends on [email or web address]   Patient seen and examined with the above-signed Advanced Practice Provider and/or Housestaff. I personally reviewed laboratory data, imaging studies and relevant notes. I independently examined the patient and formulated the important aspects of the plan. I have edited the note to reflect any of my changes or salient points. I have personally discussed the plan with the patient and/or family.  Remains weak somnolent.   Hd 2 brief episodes of SVT yesterday (much improved)  Denies CP or SOB. Vitals stable  General:  Weak appearing. No resp difficulty HEENT: normal Neck: supple. no JVD.  Cor: Regular rate & rhythm. No rubs, gallops or murmurs. Lungs: clear Abdomen: soft, nontender, nondistended. No hepatosplenomegaly. No bruits or masses. Good bowel sounds. Extremities: no cyanosis, clubbing, rash, 1+ edema Neuro: flat affect. nonfocal  SVT improved on po amio. Discussed with EP. Eill continue amio at current dose. Add b-blocker.   HF team will s/o and arrange outpatient f/u. Please call with ?s.   Toribio Fuel, MD  12:06 PM

## 2024-03-24 NOTE — Evaluation (Signed)
 Occupational Therapy Evaluation Patient Details Name: [name] MRN: [id number] DOB: 03/28/1951 Today's Date: 03/24/2024   History of Present Illness   73 y.o. female with metabolic encephalopathy,  lactic acidosis and AKI. Developed  tachycardia. Recently admitted for septic arthritis of the SI joint, then readmitted with vomiting, weakness. PMH: SI osteomyelitis and abscess. CT guided aspiration of L iliacus on 6/20. PMH includes metastatic breast CA to liver and bones in remission, PE, CHF, HTN, anxiety.     Clinical Impressions PTA pt was living at home with her husband @ modified independent level either using her RW or cane. Family has recently been assisting with LB ADL tasks due to recent hospitalizations. Pt lethargic and intermittently following commands throughout session. Overall Total A +2 for bed mobility and total A for ADL tasks due to deficits listed below. At his time, feel Patient will benefit from continued inpatient follow up therapy, <3 hours/day to maximize functional level of independence. Acute OT to follow.      If plan is discharge home, recommend the following:   Two people to help with walking and/or transfers;Two people to help with bathing/dressing/bathroom     Functional Status Assessment   Patient has had a recent decline in their functional status and demonstrates the ability to make significant improvements in function in a reasonable and predictable amount of time.     Equipment Recommendations   Audiological scientist for Other Services         Precautions/Restrictions   Precautions Precautions: Fall;Other (comment) (porta cath) Recall of Precautions/Restrictions: Impaired Restrictions Weight Bearing Restrictions Per Provider Order: No     Mobility Bed Mobility Overal bed mobility: Needs Assistance Bed Mobility: Supine to Sit, Sit to Supine     Supine to sit: +2 for physical assistance, Total assist Sit to  supine: +2 for physical assistance, Total assist   General bed mobility comments: bed pad used to mobilize EOB    Transfers                   General transfer comment: will need Maximove      Balance Overall balance assessment: Needs assistance   Sitting balance-Leahy Scale: Poor                                     ADL either performed or assessed with clinical judgement   ADL Overall ADL's : Needs assistance/impaired                                       General ADL Comments: total A for all ADL; attempted to assist with hand over hand to wash face with physical assistance     Vision Baseline Vision/History: 1 Wears glasses Vision Assessment?: Wears glasses for reading Additional Comments: will further assess; poor visual attention     Perception Perception: Impaired       Praxis Praxis: Impaired       Pertinent Vitals/Pain Pain Assessment Pain Assessment: Faces Faces Pain Scale: Hurts little more Pain Location: B knees Pain Descriptors / Indicators: Grimacing, Moaning Pain Intervention(s): Limited activity within patient's tolerance, Repositioned     Extremity/Trunk Assessment Upper Extremity Assessment Upper Extremity Assessment: Right hand dominant;RUE deficits/detail;LUE deficits/detail RUE Deficits / Details: significant edema limiting ROM of hand and wrist/elbow. Assisted with  movement patterns miminally. Unable to touch hand to mouth. minimal movement of arm off bed; unable to make full fist due to edemarash noted RUE Coordination: decreased fine motor;decreased gross motor LUE Deficits / Details: similar to RUE LUE Coordination: decreased fine motor;decreased gross motor   Lower Extremity Assessment Lower Extremity Assessment: Defer to PT evaluation   Cervical / Trunk Assessment Cervical / Trunk Assessment: Kyphotic;Other exceptions (forward head)   Communication Communication Communication: No apparent  difficulties   Cognition Arousal: Lethargic Behavior During Therapy: Flat affect Cognition: Cognition impaired   Orientation impairments: Time Awareness: Intellectual awareness impaired, Online awareness impaired Memory impairment (select all impairments): Short-term memory, Working memory, Engineer, structural memory Attention impairment (select first level of impairment): Focused attention Executive functioning impairment (select all impairments): Initiation, Organization, Sequencing, Reasoning, Problem solving                   Following commands: Impaired Following commands impaired: Follows one step commands inconsistently     Cueing  General Comments   Cueing Techniques: Verbal cues;Gestural cues;Tactile cues;Visual cues  rash over body - nsg/MD aware and being treated; edematous   Exercises Exercises: General Upper Extremity General Exercises - Upper Extremity Shoulder Flexion: PROM, Both, 5 reps Elbow Flexion: PROM, Both, 5 reps Elbow Extension: PROM, Both, 5 reps Digit Composite Flexion: AAROM, Both, 5 reps Composite Extension: AAROM, Both, 5 reps   Shoulder Instructions      Home Living Family/patient expects to be discharged to:: Private residence Living Arrangements: Spouse/significant other Available Help at Discharge: Family;Available 24 hours/day Type of Home: House Home Access: Stairs to enter;Ramped entrance Entrance Stairs-Number of Steps: 3 Entrance Stairs-Rails: None Home Layout: One level (2 steps inside of the house)     Bathroom Shower/Tub: Tub/shower unit;Curtain   Bathroom Toilet: Handicapped height Bathroom Accessibility: Yes How Accessible: Accessible via walker Home Equipment: Rollator (4 wheels);Rolling Walker (2 wheels);Wheelchair - manual;Wheelchair - power;Grab bars - tub/shower;Grab bars - toilet;Hand held shower head;Tub bench;BSC/3in1;Cane - single point;Other (comment)          Prior Functioning/Environment Prior  Level of Function : Independent/Modified Independent;History of Falls (last six months)             Mobility Comments: mod I with w/c vs RW ADLs Comments: family has been helping recently with bathing and dressing    OT Problem List: Decreased strength;Decreased range of motion;Decreased activity tolerance;Impaired balance (sitting and/or standing);Decreased coordination;Decreased cognition;Decreased safety awareness;Decreased knowledge of use of DME or AE;Cardiopulmonary status limiting activity;Obesity;Impaired UE functional use;Increased edema   OT Treatment/Interventions: Self-care/ADL training;Therapeutic exercise;Energy conservation;DME and/or AE instruction;Neuromuscular education;Therapeutic activities;Cognitive remediation/compensation;Visual/perceptual remediation/compensation;Patient/family education;Balance training      OT Goals(Current goals can be found in the care plan section)   Acute Rehab OT Goals Patient Stated Goal: to get some rest OT Goal Formulation: With family Time For Goal Achievement: 04/07/24 Potential to Achieve Goals: Fair   OT Frequency:  Min 2X/week    Co-evaluation PT/OT/SLP Co-Evaluation/Treatment: Yes Reason for Co-Treatment: Complexity of the patient's impairments (multi-system involvement);For patient/therapist safety;To address functional/ADL transfers;Necessary to address cognition/behavior during functional activity   OT goals addressed during session: ADL's and self-care      AM-PAC OT 6 Clicks Daily Activity     Outcome Measure Help from another person eating meals?: Total Help from another person taking care of personal grooming?: Total Help from another person toileting, which includes using toliet, bedpan, or urinal?: Total Help from another person bathing (including washing, rinsing, drying)?: Total Help from another  person to put on and taking off regular upper body clothing?: Total Help from another person to put on and  taking off regular lower body clothing?: Total 6 Click Score: 6   End of Session Equipment Utilized During Treatment: Gait belt Nurse Communication: Mobility status;Need for lift equipment (Maximove)  Activity Tolerance: Patient limited by fatigue;Patient limited by lethargy Patient left: in bed;with call bell/phone within reach;with bed alarm set;with family/visitor present;with SCD's reapplied (modified chair position)  OT Visit Diagnosis: Unsteadiness on feet (R26.81);Other abnormalities of gait and mobility (R26.89);Muscle weakness (generalized) (M62.81);Other symptoms and signs involving the nervous system (R29.898);Other symptoms and signs involving cognitive function                Time: 9053-8990 OT Time Calculation (min): 23 min Charges:  OT General Charges $OT Visit: 1 Visit OT Evaluation $OT Eval Moderate Complexity: 1 Mod  Wali Reinheimer, OT/L   Acute OT Clinical Specialist Acute Rehabilitation Services Pager [phone] Office [phone]   [facility] 03/24/2024, 10:51 AM

## 2024-03-24 NOTE — Evaluation (Signed)
 Physical Therapy Evaluation Patient Details Name: [name] MRN: [id number] DOB: Oct 06, 1950 Today's Date: 03/24/2024  History of Present Illness  73 y.o. female with metabolic encephalopathy,  lactic acidosis and AKI. Developed  tachycardia. Recently admitted for septic arthritis of the SI joint, then readmitted with vomiting, weakness. PMH: SI osteomyelitis and abscess. CT guided aspiration of L iliacus on 6/20. PMH includes metastatic breast CA to liver and bones in remission, PE, CHF, HTN, anxiety.  Clinical Impression  PTA,pt lives at home with her spouse, is modI with RW vs cane and requires assist for some ADL's. Pt presents with gross change from her baseline with generalized weakness/debility, lethargy, impaired cognition, poor sitting balance, and decreased activity tolerance. Pt requiring two person total assist for bed mobility and demonstrates posterior vs right lateral lean sitting edge of bed. Pt unable to stand. VSS throughout. Patient will benefit from continued inpatient follow up therapy, <3 hours/day to address deficits, maximize functional mobility and decrease caregiver burden.      If plan is discharge home, recommend the following: Two people to help with walking and/or transfers;Two people to help with bathing/dressing/bathroom   Can travel by private vehicle   No    Equipment Recommendations Hospital bed;Hoyer lift  Recommendations for Other Services       Functional Status Assessment Patient has had a recent decline in their functional status and/or demonstrates limited ability to make significant improvements in function in a reasonable and predictable amount of time     Precautions / Restrictions Precautions Precautions: Fall;Other (comment) (porta cath) Recall of Precautions/Restrictions: Impaired Restrictions Weight Bearing Restrictions Per Provider Order: No      Mobility  Bed Mobility Overal bed mobility: Needs Assistance Bed Mobility: Supine  to Sit, Sit to Supine     Supine to sit: +2 for physical assistance, Total assist Sit to supine: +2 for physical assistance, Total assist   General bed mobility comments: bed pad used to mobilize EOB    Transfers                   General transfer comment: will need Maximove    Ambulation/Gait                  Stairs            Wheelchair Mobility     Tilt Bed    Modified Rankin (Stroke Patients Only)       Balance Overall balance assessment: Needs assistance   Sitting balance-Leahy Scale: Poor                                       Pertinent Vitals/Pain Pain Assessment Pain Assessment: Faces Faces Pain Scale: Hurts little more Pain Location: B knees Pain Descriptors / Indicators: Grimacing, Moaning Pain Intervention(s): Limited activity within patient's tolerance, Monitored during session, Repositioned    Home Living Family/patient expects to be discharged to:: Private residence Living Arrangements: Spouse/significant other Available Help at Discharge: Family;Available 24 hours/day Type of Home: House Home Access: Stairs to enter;Ramped entrance Entrance Stairs-Rails: None Entrance Stairs-Number of Steps: 3   Home Layout: One level (2 steps inside of the house) Home Equipment: Rollator (4 wheels);Rolling Walker (2 wheels);Wheelchair - manual;Wheelchair - power;Grab bars - tub/shower;Grab bars - toilet;Hand held shower head;Tub bench;BSC/3in1;Cane - single point;Other (comment)      Prior Function Prior Level of Function : Independent/Modified Independent;History of Falls (  last six months)             Mobility Comments: mod I with w/c vs RW ADLs Comments: family has been helping recently with bathing and dressing     Extremity/Trunk Assessment   Upper Extremity Assessment Upper Extremity Assessment: Defer to OT evaluation RUE Deficits / Details: significant edema limiting ROM of hand and wrist/elbow. Assisted  with movement patterns miminally. Unable to touch hand to mouth. minimal movement of arm off bed; unable to make full fist due to edemarash noted RUE Coordination: decreased fine motor;decreased gross motor LUE Deficits / Details: similar to RUE LUE Coordination: decreased fine motor;decreased gross motor    Lower Extremity Assessment Lower Extremity Assessment: Generalized weakness;RLE deficits/detail;LLE deficits/detail RLE Deficits / Details: Increased edema LLE Deficits / Details: Increased edema    Cervical / Trunk Assessment Cervical / Trunk Assessment: Kyphotic;Other exceptions (forward head)  Communication   Communication Communication: No apparent difficulties    Cognition Arousal: Lethargic Behavior During Therapy: Flat affect   PT - Cognitive impairments: Orientation, Awareness, Memory, Attention, Initiation, Sequencing, Problem solving, Safety/Judgement   Orientation impairments: Time, Situation                     Following commands: Impaired Following commands impaired: Follows one step commands inconsistently     Cueing Cueing Techniques: Verbal cues, Gestural cues, Tactile cues, Visual cues     General Comments General comments (skin integrity, edema, etc.): rash over body - nsg/MD aware and being treated; edematous    Exercises General Exercises - Upper Extremity Shoulder Flexion: PROM, Both, 5 reps Elbow Flexion: PROM, Both, 5 reps Elbow Extension: PROM, Both, 5 reps Digit Composite Flexion: AAROM, Both, 5 reps Composite Extension: AAROM, Both, 5 reps   Assessment/Plan    PT Assessment Patient needs continued PT services  PT Problem List Decreased strength;Decreased activity tolerance;Decreased balance;Decreased mobility;Decreased safety awareness       PT Treatment Interventions DME instruction;Functional mobility training;Therapeutic activities;Therapeutic exercise;Balance training;Patient/family education;Wheelchair mobility  training;Manual techniques;Modalities    PT Goals (Current goals can be found in the Care Plan section)  Acute Rehab PT Goals Patient Stated Goal: pt family seems agreeable to rehab PT Goal Formulation: With patient/family Time For Goal Achievement: 04/07/24 Potential to Achieve Goals: Fair    Frequency Min 2X/week     Co-evaluation PT/OT/SLP Co-Evaluation/Treatment: Yes Reason for Co-Treatment: Complexity of the patient's impairments (multi-system involvement);For patient/therapist safety;To address functional/ADL transfers;Necessary to address cognition/behavior during functional activity PT goals addressed during session: Mobility/safety with mobility OT goals addressed during session: ADL's and self-care       AM-PAC PT 6 Clicks Mobility  Outcome Measure Help needed turning from your back to your side while in a flat bed without using bedrails?: Total Help needed moving from lying on your back to sitting on the side of a flat bed without using bedrails?: Total Help needed moving to and from a bed to a chair (including a wheelchair)?: Total Help needed standing up from a chair using your arms (e.g., wheelchair or bedside chair)?: Total Help needed to walk in hospital room?: Total Help needed climbing 3-5 steps with a railing? : Total 6 Click Score: 6    End of Session   Activity Tolerance: Patient limited by lethargy Patient left: in bed;with call bell/phone within reach;with bed alarm set Nurse Communication: Mobility status PT Visit Diagnosis: Muscle weakness (generalized) (M62.81);History of falling (Z91.81)    Time: 9066-8992 PT Time Calculation (min) (ACUTE ONLY): 34 min  Charges:   PT Evaluation $PT Eval Moderate Complexity: 1 Mod   PT General Charges $$ ACUTE PT VISIT: 1 Visit         Aleck Daring, PT, DPT Acute Rehabilitation Services Office [phone]   [name] 03/24/2024, 1:01 PM

## 2024-03-24 NOTE — NC FL2 (Signed)
 [place]  MEDICAID FL2 LEVEL OF CARE FORM     IDENTIFICATION  Patient Name: [name] Birthdate: 1950-12-18 Sex: female Admission Date (Current Location): 03/21/2024  [facility] and [place] Number:  [employer] and Address:  [facility], [address] [place], [place] [address]      Provider Number: [id number]  Attending Physician Name and Address:  Jonel Lonni SQUIBB, *  Relative Name and Phone Number:       Current Level of Care: Hospital Recommended Level of Care: Skilled Nursing Facility Prior Approval Number:    Date Approved/Denied:   PASRR Number: [id number]  Discharge Plan: SNF    Current Diagnoses: Patient Active Problem List   Diagnosis Date Noted   Narrow complex tachycardia (HCC) 03/23/2024   Lactic acidosis 03/22/2024   AKI (acute kidney injury) (HCC) 03/22/2024   GAD (generalized anxiety disorder) 03/22/2024   Hyponatremia 03/22/2024   Acute metabolic encephalopathy 03/21/2024   Rash 03/20/2024   Medication management 03/20/2024   PICC (peripherally inserted central catheter) in place 03/20/2024   Septic arthritis of sacroiliac joint (HCC) 03/20/2024   Intractable nausea and vomiting 03/13/2024   Esophagitis 03/13/2024   Urinary frequency 03/13/2024   Diarrhea 03/13/2024   SIRS (systemic inflammatory response syndrome) (HCC) 03/13/2024   HLD (hyperlipidemia) 02/18/2024   Bacteremia due to group B Streptococcus 02/17/2024   Septic arthritis (HCC) 02/14/2024   Osteomyelitis (HCC) 02/14/2024   Abscess of left iliac fossa 02/13/2024   Hypomagnesemia 01/20/2024   Hematemesis 01/19/2024   History of pulmonary embolism 01/19/2024   Chronic anticoagulation 01/19/2024   DNR (do not resuscitate).DNI(Do Not Intubate) 01/19/2024   Hypokalemia 01/19/2024   Acute metabolic acidosis 01/19/2024   Goals of care, counseling/discussion 08/13/2019   Port-A-Cath in place 08/14/2018   Obesity, Class III, BMI 40-49.9  (morbid obesity) 09/22/2015   Malignant neoplasm of both breasts in female, estrogen receptor negative (HCC) 09/22/2015   Coagulopathy (HCC) 09/22/2015   Peripheral neuropathy 09/22/2015   Hot flashes 03/24/2015   Right shoulder pain 06/12/2013   Neck pain 06/12/2013   Anemia of chronic disease 03/21/2013   Breast cancer metastasized to multiple sites (HCC) 02/26/2013   Chronic diastolic CHF (congestive heart failure) (HCC) 09/11/2012   Neuropathy 09/05/2012   Essential hypertension 09/05/2012   Anxiety 09/05/2012    Orientation RESPIRATION BLADDER Height & Weight     Self, Time, Situation, Place  Normal Continent Weight: 239 lb 10.2 oz (108.7 kg) Height:  5' 3 (160 cm)  BEHAVIORAL SYMPTOMS/MOOD NEUROLOGICAL BOWEL NUTRITION STATUS      Continent Diet (regular)  AMBULATORY STATUS COMMUNICATION OF NEEDS Skin   Extensive Assist Verbally Skin abrasions (skin tear, left hip)                       Personal Care Assistance Level of Assistance  Bathing, Feeding, Dressing Bathing Assistance: Maximum assistance Feeding assistance: Maximum assistance Dressing Assistance: Maximum assistance     Functional Limitations Info  Sight Sight Info: Impaired        SPECIAL CARE FACTORS FREQUENCY  PT (By licensed PT), OT (By licensed OT)     PT Frequency: 5x/wk OT Frequency: 5x/wk            Contractures Contractures Info: Not present    Additional Factors Info  Code Status, Allergies Code Status Info: Full Allergies Info: Penicillins, Adhesive (Tape), Ceftriaxone , Daptomycin            Current Medications (03/24/2024):  This is the current hospital active medication list Current Facility-Administered Medications  Medication Dose Route Frequency Provider Last Rate Last Admin   acetaminophen  (TYLENOL ) tablet 650 mg  650 mg Oral Q6H PRN Howerter, Justin B, DO   650 mg at 03/22/24 0603   Or   acetaminophen  (TYLENOL ) suppository 650 mg  650 mg Rectal Q6H PRN Howerter,  Justin B, DO       amiodarone  (PACERONE ) tablet 200 mg  200 mg Oral BID Hayes Lander L, NP   200 mg at 03/24/24 1150   apixaban  (ELIQUIS ) tablet 5 mg  5 mg Oral BID Howerter, Justin B, DO   5 mg at 03/24/24 1148   Chlorhexidine  Gluconate Cloth 2 % PADS 6 each  6 each Topical Daily Howerter, Justin B, DO   6 each at 03/24/24 1205   diphenhydrAMINE  (BENADRYL ) capsule 25 mg  25 mg Oral BID PRN Jonel Lonni SQUIBB, MD   25 mg at 03/22/24 1507   diphenhydrAMINE -zinc  acetate (BENADRYL ) 2-0.1 % cream   Topical TID PRN Jonel Lonni SQUIBB, MD   Given at 03/22/24 1148   doxycycline  (VIBRA -TABS) tablet 100 mg  100 mg Oral Q12H Fobbs, Rodericks T, RPH   100 mg at 03/24/24 1150   ertapenem  (INVANZ ) 1 g in sodium chloride  0.9 % 100 mL IVPB  1 g Intravenous Q24H Jonel Lonni SQUIBB, MD 200 mL/hr at 03/24/24 1222 1 g at 03/24/24 1222   hydrocortisone  cream 1 %   Topical BID Jonel Lonni SQUIBB, MD   Given at 03/24/24 1206   melatonin tablet 3 mg  3 mg Oral QHS PRN Howerter, Justin B, DO   3 mg at 03/22/24 2146   menthol -cetylpyridinium (CEPACOL) lozenge 3 mg  1 lozenge Oral PRN Howerter, Justin B, DO   3 mg at 03/22/24 0257   metoprolol  succinate (TOPROL -XL) 24 hr tablet 25 mg  25 mg Oral Daily Lee, [place], NP   25 mg at 03/24/24 1148   ondansetron  (ZOFRAN ) injection 4 mg  4 mg Intravenous Q6H PRN Opyd, Timothy S, MD   4 mg at 03/23/24 2330   oxyCODONE  (Oxy IR/ROXICODONE ) immediate release tablet 5 mg  5 mg Oral Q4H PRN Jonel Lonni SQUIBB, MD   5 mg at 03/24/24 0151   sodium chloride  flush (NS) 0.9 % injection 10-40 mL  10-40 mL Intracatheter Q12H Danford, Lonni SQUIBB, MD   10 mL at 03/24/24 1223   Facility-Administered Medications Ordered in Other Encounters  Medication Dose Route Frequency Provider Last Rate Last Admin   sodium chloride  flush (NS) 0.9 % injection 10 mL  10 mL Intravenous PRN Magrinat, Gustav C, MD   10 mL at 12/15/15 1200   sodium chloride  flush (NS) 0.9 % injection 10 mL  10  mL Intracatheter PRN Magrinat, Sandria BROCKS, MD   10 mL at 08/14/18 1116   sodium chloride  flush (NS) 0.9 % injection 10 mL  10 mL Intracatheter PRN Magrinat, Sandria BROCKS, MD         Discharge Medications: Please see discharge summary for a list of discharge medications.  Relevant Imaging Results:  Relevant Lab Results:   Additional Information SS#: [id number]  Almarie CHRISTELLA Goodie, LCSW

## 2024-03-24 NOTE — Progress Notes (Signed)
 Progress Note   Patient: [name] FMW:[id number] DOB: 1951/02/04 DOA: 03/21/2024     3 DOS: the patient was seen and examined on 03/24/2024 at 11:30AM      Brief hospital course: 73 y.o. F with BrCA metastatic to liver and bone for many years, now in remission on Herceptin , recently admitted for septic arthritis of SI joint and sacral osteo, dCHF, and hx PE on Eliquis , admitted with unresponsive episode.  5/25-57: Admitted with vomiting, ?hematemesis; [facility] consulted, EGD showed congestive gastropathy only, Hgb stable, discharged  6/19-26: Readmitted with slowly progressive left hip pain, outpatient MRI showed SI septic arthritis, sacral osteo and iliac fossa abscess; admitted for ID consult --> started on 6 weeks Dapto and Rocephin   7/18-22: Readmitted for vomiting again, CT showed thickening of the esophagus, no other concerning findings; [facility] consulted, symptoms seemed to improve, repeat EGD deferred  7/25: Reported new rash at ID appt, Rocephin  changed to ertapenem   Seemed to be improving in the 7/22-26 interval, then on day of admission, DIL was helping clean her up before [facility] RN came over for first ertapenem  shot, she got up to go to the bathroom and walked there herself, but didn't come out, so daughter went in, found her slumped on the toilet, was groggy and in and out of responsiveness; couldn't stand or sit up.  No seizure-like activity, no falling off toilet.  EMS activated, paramedics lifted her off toilet and brought to the [facility] ER.  There, CTA chest normal. Esophageal thickening not noted.  Lactic acid >9, procal 0.84, eosinophils elevated on Diff.  Started on vancomycin  and Zosyn and transferred to [facility].   Here, Cr 1.3, procal 3.2.  BNP, ammonia, CK and TSH normal.        Assessment and Plan: * Unresponsive episode Lactic acidosis This was the chief presenting complaint, and I suspect was from her tachyarrhythmia (see below).  Do NOT suspect worsening  infection while on Daptomycin , Rocephin .  Lactate >9 on admission.  Sepsis ruled out. Abdomen benign. No respiratory symptoms, albuterol  use, liver disease.   I really don't have an explanation for this degree of lactic acid elevation, other than the possibility that this was from tachyarrhythmia at home.  Cardiology doubt this.  Lactate resolved.     Acute metabolic encephalopathy Generalized weakness Failure to thrive Hypoalbuminemia The above unresponsive episode appears to be in the context of a broader decline in function.  At baseline prior to the last 2 months, patient spends most of day in chair or bed, but can  transfer, toilet self and walk with walker.   Family note her strength and oral intake seem to be going way down in the last few months.     The congestive gastropathy and esophageal thickening noted above have been evaluated by GI twice and appear to not be treatable.  Her cancer seems in remission, but now with the pelvic infection, even though she is nearing completion of treatment, she apepars to be failing to thrive. - Dietitian consult - Palliative care consult - PT/OT    Narrow complex tachycardia (HCC) Coreg  held at admission due to hypotension.  Then developed new narrow complex tachycardia 7/28.  This resolved with diltiazem , Cardiology suspect atrial tachcardia, maybe junctional tachycardia.  They changed Coreg  to amiodarone /metoprolol , have signed off. Few brief episodes of SVT overnight - Continue tele - Continue new amiodarone  and metoprolol      Septic arthritis of sacroiliac joint (HCC) Group B strep bacteremia ID consulted  and changed to doxycycline  ertapenem .  No change to end date.  Have now signed off. - Continue doxycycline , ertapenem     Rash Eosinophilia This developed in the last few weeks but patient didn't note it until after the last admission.  ID suspect it is Rocephin  allergy.  DRESS ruled out.    - Doxycycline  and  ertapenem    Hyponatremia Mild, asymptomatic  GAD (generalized anxiety disorder) - Hold Xanax , duloxetine   AKI (acute kidney injury) (HCC) Creatinine 1.3 on admission up from baseline 0.8. Improved to baseline with holding diuretics, losartan  - Hold losartan , diuretics  HLD (hyperlipidemia) - Hold Crestor  for now  Hypomagnesemia - Supplement magnesium   Hypokalemia - Keep K>4  History of pulmonary embolism - Continue home Eliquis   Obesity, Class III, BMI 40-49.9 (morbid obesity) BMI 41.8, complicates care  Anemia of chronic disease Hgb stable, no clinical bleeding  Had been admitted last may for hematemesis, EGD unremarkable.  Readmitted last week for vomiting again and esophageal thickening on imaging, work up unremarkable.  Now resolved on CT chest.  Breast cancer metastasized to multiple sites [facility]) Diagnosed in 2005, metastatic for many years to liver, bone. S/p chemo and radiation to the chest wall.  Has been on Herceptin .  Most recent CT surveillance shows no evidence of metastases anywhere. - Outpatient follow-up with oncology  Chronic diastolic CHF (congestive heart failure) (HCC) Volume status difficult to assess but not overloaded - Hold furosemide  given soft blood pressure  Essential hypertension - Hold amlodipine , furosemide , losartan , spironolactone  - Stop carvedilol  - Continue new metoprolol           Subjective: Patient is fairly listless and tired today.  She appears edematous.  She is very weak.  She has had very poor appetite.  She is oriented but very sleepy.     Physical Exam: BP (!) 92/58 (BP Location: Right Wrist)   Pulse 90   Temp 97.6 F (36.4 C) (Oral)   Resp 16   Ht 5' 3 (1.6 m)   Wt 108.7 kg   SpO2 99%   BMI 42.45 kg/m   Obese adult female, lying in bed, appears weak and tired Her faint red rash is resolving, lips and mouth normal RRR, no murmurs, diffuse anasarca Respiratory rate seems normal, lung sounds diminished  due to body habitus, no rales or wheezes appreciated, breathing shallow but unlabored Abdomen soft, no tenderness palpation or guarding Attention diminished, affect tired, she has mild psychomotor slowing, she has severe generalized weakness, cannot lift her arms or legs against gravity.     Data Reviewed: Discussed with cardiology Basic metabolic panel shows mild hyponatremia, slightly worsening metabolic acidosis, normalized renal function CBC shows improved leukocytosis, stable anemia, mild thrombocytopenia    Family Communication: Daughter, husband at bedside    Disposition: Status is: Inpatient 73 yo F with longstanding metastatic BrCA in remsision on Herceptin , now with third readmission in setting of pelvic osteomyelitis/septic arthritis and worsesning failure to thrive.  Likely will need several more days in hospital to delineate goals of care before transition to SNF rehab or home later this week.  Palliative Care consulted.  She is high risk for readmission.        Author: Lonni SHAUNNA Dalton, MD 03/24/2024 5:35 PM  For on call review [email or web address].

## 2024-03-24 NOTE — TOC Progression Note (Signed)
 Transition of Care [facility]) - Progression Note    Patient Details  Name: NATALIN BIBLE MRN: [id number] Date of Birth: [birth year]  [facility] of [facility]) CM/SW Contact  Almarie CHRISTELLA Goodie, [place] Phone Number: 03/24/2024, 2:56 PM  Clinical Narrative:   CSW met with patient, spouse, and daughter at bedside to discuss recommendation for SNF. Family in agreement, no preference for facility. CSW received permission to fax out referral, will follow with choice list. Noting palliative consult also placed for goals of care. CSW to follow.    Expected Discharge Plan: Skilled Nursing Facility Barriers to Discharge: Continued Medical Work up               Expected Discharge Plan and Services In-house Referral: Clinical Social Work Discharge Planning Services: CM Consult Post Acute Care Choice: Skilled Nursing Facility Living arrangements for the past 2 months: Single Family Home                                       Social Drivers of Health (SDOH) Interventions SDOH Screenings   Food Insecurity: No Food Insecurity (03/22/2024)  Housing: Low Risk  (03/22/2024)  Transportation Needs: No Transportation Needs (03/22/2024)  Utilities: Not At Risk (03/22/2024)  Depression (PHQ2-9): Low Risk  (03/19/2024)  Social Connections: Moderately Integrated (03/22/2024)  Recent Concern: Social Connections - Moderately Isolated (02/14/2024)  Tobacco Use: Low Risk  (03/21/2024)    Readmission Risk Interventions    03/16/2024    4:38 PM 02/14/2024   12:50 PM 01/21/2024    1:47 PM  Readmission Risk Prevention Plan  Transportation Screening Complete Complete Complete  PCP or Specialist Appt within 3-5 Days   Complete  HRI or Home Care Consult   Complete  Social Work Consult for Recovery Care Planning/Counseling   Complete  Palliative Care Screening   Not Applicable  Medication Review [name]) Complete Complete Complete  PCP or Specialist appointment within 3-5 days of discharge  Complete Complete   HRI or Home Care Consult Complete Complete   SW Recovery Care/Counseling Consult Not Complete    Palliative Care Screening Not Applicable    Skilled Nursing Facility Not Applicable

## 2024-03-24 NOTE — TOC Progression Note (Signed)
 Transition of Care [facility]) - Progression Note    Patient Details  Name: [name] MRN: [id number] Date of Birth: [birth year]  Transition of Care [facility]) CM/SW Contact  Justina Delcia Czar, RN Phone Number: [phone] 03/24/2024, 2:40 PM  Clinical Narrative:    HF IP CM spoke to pt's, husband, dtr and son at bedside. Dtr states she spoke to IP CSW, Vertell and wants SNF rehab.  Explained SNF rehab will order any DME once she has complete rehab such as hospital bed or hoyer lift.   Gave permission to create FL2 and fax referral for SNF rehab.    Expected Discharge Plan: Skilled Nursing Facility Barriers to Discharge: Continued Medical Work up               Expected Discharge Plan and Services In-house Referral: Clinical Social Work Discharge Planning Services: CM Consult Post Acute Care Choice: Skilled Nursing Facility Living arrangements for the past 2 months: Single Family Home                                       Social Drivers of Health (SDOH) Interventions SDOH Screenings   Food Insecurity: No Food Insecurity (03/22/2024)  Housing: Low Risk  (03/22/2024)  Transportation Needs: No Transportation Needs (03/22/2024)  Utilities: Not At Risk (03/22/2024)  Depression (PHQ2-9): Low Risk  (03/19/2024)  Social Connections: Moderately Integrated (03/22/2024)  Recent Concern: Social Connections - Moderately Isolated (02/14/2024)  Tobacco Use: Low Risk  (03/21/2024)    Readmission Risk Interventions    03/16/2024    4:38 PM 02/14/2024   12:50 PM 01/21/2024    1:47 PM  Readmission Risk Prevention Plan  Transportation Screening Complete Complete Complete  PCP or Specialist Appt within 3-5 Days   Complete  HRI or Home Care Consult   Complete  Social Work Consult for Recovery Care Planning/Counseling   Complete  Palliative Care Screening   Not Applicable  Medication Review [name]) Complete Complete Complete  PCP or Specialist appointment within 3-5 days of  discharge Complete Complete   HRI or Home Care Consult Complete Complete   SW Recovery Care/Counseling Consult Not Complete    Palliative Care Screening Not Applicable    Skilled Nursing Facility Not Applicable

## 2024-03-24 NOTE — Care Management Important Message (Signed)
 Important Message  Patient Details  Name: [name] MRN: [id number] Date of Birth: March 17, 1951   Important Message Given:  Yes - Medicare IM     Claretta Deed 03/24/2024, 2:57 PM

## 2024-03-24 NOTE — Plan of Care (Signed)
  Problem: Education: Goal: Knowledge of General Education information will improve Description: Including pain rating scale, medication(s)/side effects and non-pharmacologic comfort measures Outcome: Progressing   Problem: Health Behavior/Discharge Planning: Goal: Ability to manage health-related needs will improve Outcome: Progressing   Problem: Clinical Measurements: Goal: Will remain free from infection Outcome: Progressing   Problem: Clinical Measurements: Goal: Respiratory complications will improve Outcome: Progressing   Problem: Activity: Goal: Risk for activity intolerance will decrease Outcome: Progressing   Problem: Nutrition: Goal: Adequate nutrition will be maintained Outcome: Progressing   Problem: Coping: Goal: Level of anxiety will decrease Outcome: Progressing   Problem: Elimination: Goal: Will not experience complications related to bowel motility Outcome: Progressing   Problem: Pain Managment: Goal: General experience of comfort will improve and/or be controlled Outcome: Progressing   Problem: Safety: Goal: Ability to remain free from injury will improve Outcome: Progressing   Problem: Skin Integrity: Goal: Risk for impaired skin integrity will decrease Outcome: Progressing

## 2024-03-24 NOTE — Progress Notes (Signed)
 Nutrition appointment canceled as patient is in the hospital currently.  Appointment will be rescheduled as needed.

## 2024-03-24 NOTE — Progress Notes (Signed)
 Pt is nauseated and vomited twice, informed MD Opyd, prn order given thru IV. Due Oral Med are also put on hold, as per MD.   [phone] Due Amiodarone  were given orally as pt no longer feel nauseated and as she tolerated the pain oral med that was given earlier when she complaint of pain.

## 2024-03-25 ENCOUNTER — Other Ambulatory Visit: Payer: Self-pay

## 2024-03-25 DIAGNOSIS — G9340 Encephalopathy, unspecified: Secondary | ICD-10-CM | POA: Diagnosis not present

## 2024-03-25 LAB — CBC
HCT: 26.7 % — ABNORMAL LOW (ref 36.0–46.0)
Hemoglobin: 9.7 g/dL — ABNORMAL LOW (ref 12.0–15.0)
MCH: 27.3 pg (ref 26.0–34.0)
MCHC: 36.3 g/dL — ABNORMAL HIGH (ref 30.0–36.0)
MCV: 75.2 fL — ABNORMAL LOW (ref 80.0–100.0)
Platelets: 133 K/uL — ABNORMAL LOW (ref 150–400)
RBC: 3.55 MIL/uL — ABNORMAL LOW (ref 3.87–5.11)
RDW: 17.1 % — ABNORMAL HIGH (ref 11.5–15.5)
WBC: 13.7 K/uL — ABNORMAL HIGH (ref 4.0–10.5)
nRBC: 0.1 % (ref 0.0–0.2)

## 2024-03-25 LAB — COMPREHENSIVE METABOLIC PANEL WITH GFR
ALT: 16 U/L (ref 0–44)
AST: 15 U/L (ref 15–41)
Albumin: 1.5 g/dL — ABNORMAL LOW (ref 3.5–5.0)
Alkaline Phosphatase: 73 U/L (ref 38–126)
Anion gap: 6 (ref 5–15)
BUN: 20 mg/dL (ref 8–23)
CO2: 19 mmol/L — ABNORMAL LOW (ref 22–32)
Calcium: 7.6 mg/dL — ABNORMAL LOW (ref 8.9–10.3)
Chloride: 106 mmol/L (ref 98–111)
Creatinine, Ser: 0.93 mg/dL (ref 0.44–1.00)
GFR, Estimated: >60 mL/min (ref >60)
Glucose, Bld: 87 mg/dL (ref 70–99)
Potassium: 3.9 mmol/L (ref 3.5–5.1)
Sodium: 131 mmol/L — ABNORMAL LOW (ref 135–145)
Total Bilirubin: 1.1 mg/dL (ref 0.0–1.2)
Total Protein: 4.1 g/dL — ABNORMAL LOW (ref 6.5–8.1)

## 2024-03-25 LAB — CORTISOL: Cortisol, Plasma: 11.1 ug/dL

## 2024-03-25 MED ORDER — ALBUMIN HUMAN 25 % IV SOLN
25.0000 g | Freq: Once | INTRAVENOUS | Status: AC
  Administered 2024-03-25: 25 g via INTRAVENOUS
  Filled 2024-03-25: qty 100

## 2024-03-25 MED ORDER — SODIUM CHLORIDE 0.9 % IV BOLUS
500.0000 mL | Freq: Once | INTRAVENOUS | Status: AC
  Administered 2024-03-25: 500 mL via INTRAVENOUS

## 2024-03-25 NOTE — Plan of Care (Signed)
   Problem: Clinical Measurements: Goal: Will remain free from infection Outcome: Progressing Goal: Diagnostic test results will improve Outcome: Progressing Goal: Respiratory complications will improve Outcome: Progressing Goal: Cardiovascular complication will be avoided Outcome: Progressing

## 2024-03-25 NOTE — Plan of Care (Signed)
  Problem: Clinical Measurements: Goal: Ability to maintain clinical measurements within normal limits will improve Outcome: Progressing Goal: Cardiovascular complication will be avoided Outcome: Progressing   Problem: Activity: Goal: Risk for activity intolerance will decrease Outcome: Progressing   Problem: Elimination: Goal: Will not experience complications related to bowel motility Outcome: Progressing Goal: Will not experience complications related to urinary retention Outcome: Progressing   Problem: Skin Integrity: Goal: Risk for impaired skin integrity will decrease Outcome: Progressing

## 2024-03-25 NOTE — Progress Notes (Signed)
 PROGRESS NOTE  [name] FMW:[id number] DOB: March 09, 1951 DOA: 03/21/2024 PCP: Jefferey Fitch, MD   LOS: 4 days   Brief Narrative / Interim history: 73 year old female with breast cancer now in remission, recently admitted for septic arthritis of SI joint and sacral osteomyelitis, diastolic CHF, history of PE on Eliquis  here with an unresponsive episode.  She has had few hospitalizations this year in May, June, July, diagnosed with SI septic arthritis currently on 6 weeks of antibiotics.  She was readmitted 7/18-22 for vomiting, CT showed thickening of the esophagus, she improved with conservative management.  Of note, she had an EGD in May 2025 for vomiting/hematemesis, and it showed congestive gastropathy only.  Seen by ID as an outpatient on 7/25, due to rash Rocephin  was changed to ertapenem .  On the day of admission patient was found to be slumped on the toilet, groggy, and out of responsiveness.  She was brought to the ER and admitted to the hospital.  Was found to have elevated lactic acid  Subjective / 24h Interval events: Hypotensive earlier this morning, patient is sleeping when I enter the room but wakes up easily, voices no complaints.  Assesement and Plan: Principal Problem:   Acute metabolic encephalopathy Active Problems:   Septic arthritis of sacroiliac joint (HCC)   Rash   Narrow complex tachycardia (HCC)   Essential hypertension   Chronic diastolic CHF (congestive heart failure) (HCC)   Breast cancer metastasized to multiple sites (HCC)   Anemia of chronic disease   Obesity, Class III, BMI 40-49.9 (morbid obesity)   History of pulmonary embolism   Hypokalemia   Hypomagnesemia   Bacteremia due to group B Streptococcus   HLD (hyperlipidemia)   Lactic acidosis   AKI (acute kidney injury) (HCC)   GAD (generalized anxiety disorder)   Hyponatremia  Principal problem Unresponsive episode, lactic acidosis -patient was found to have tachyarrhythmia, see below,  which may have contributed to her unresponsive episode.  Lactate was very elevated on admission, unclear reason, not septic.  Lactate improved  Active problems Narrow complex tachycardia -Coreg  was hold on admission due to hypotension.  She developed narrow complex tachycardia, resolved with diltiazem .  Cardiology suspected atrial tachycardia, possibly junctional tachycardia.  Her regimen was changed to metoprolol  and amiodarone , telemetry reviewed this morning and no further episodes.  Hold metoprolol  this morning due to hypotension, continue amiodarone  alone  Acute metabolic encephalopathy, generalized weakness, FTT -she seems to have had a broader decline in function.  She has been having poor p.o. intake in the last 4 months as well.  Palliative consulted, appreciate input.  PT/OT evaluation recommends SNF  Septic arthritis of sacroiliac joint, group B strep bacteremia -currently on ertapenem  and doxycycline .  End date 03/30/2024  Rash, eosinophilia - This developed in the last few weeks but patient didn't note it until after the last admission.  ID suspect it is Rocephin  allergy.  DRESS ruled out.      Hyponatremia - Mild, asymptomatic   GAD - Hold Xanax , duloxetine    AKI (acute kidney injury) (HCC) - Creatinine 1.3 on admission up from baseline 0.8. Improved to baseline with holding diuretics, losartan    HLD - Hold Crestor  for now   Hypomagnesemia - Supplement magnesium    Hypokalemia - Keep K>4.  Potassium stable today   History of pulmonary embolism - Continue home Eliquis    Obesity, Class III - BMI 41.8, complicates care   Anemia of chronic disease - Hgb stable, no clinical bleeding   Scheduled Meds:  amiodarone   200 mg Oral BID   apixaban   5 mg Oral BID   Chlorhexidine  Gluconate Cloth  6 each Topical Daily   doxycycline   100 mg Oral Q12H   hydrocortisone  cream   Topical BID   metoprolol  succinate  25 mg Oral Daily   sodium chloride  flush  10-40 mL Intracatheter Q12H    Continuous Infusions:  ertapenem  1 g (03/24/24 1222)   PRN Meds:.acetaminophen  **OR** acetaminophen , diphenhydrAMINE , diphenhydrAMINE -zinc  acetate, melatonin, menthol -cetylpyridinium, ondansetron  (ZOFRAN ) IV, oxyCODONE   Current Outpatient Medications  Medication Instructions   acetaminophen  (TYLENOL ) 500-1,000 mg, Every 6 hours PRN   albuterol  (VENTOLIN  HFA) 108 (90 Base) MCG/ACT inhaler 2 puffs, Inhalation, Every 6 hours PRN   ALPRAZolam  (XANAX ) 1 mg, Oral, 3 times daily PRN   amLODipine  (NORVASC ) 10 mg, Oral, Every morning   apixaban  (ELIQUIS ) 5 mg, Oral, 2 times daily   CALCIUM  PO 2 tablets, Daily   carvedilol  (COREG ) 12.5 mg, Oral, 2 times daily with meals   cefTRIAXone  (ROCEPHIN ) IVPB 2 g, Intravenous, Every 24 hours, Indication:  L-iliac fossa abscess First Dose: Yes Last Day of Therapy:  03/30/24 Labs - Once weekly:  CBC/D and BMP, Labs - Once weekly- ESR and CRP Method of administration: IV Push Method of administration may be changed at the discretion of home infusion pharmacist based upon assessment of the patient and/or caregiver's ability to self-administer the medication ordered.   cyclobenzaprine  (FLEXERIL ) 5 mg, Oral, 2 times daily PRN   daptomycin  (CUBICIN ) IVPB 700 mg, Intravenous, Every 24 hours, Indication:  L-iliac fossa abscess First Dose: Yes Last Day of Therapy:  03/30/24 Labs - Once weekly:  CBC/D, BMP, and CPK Labs - Once weekly- ESR and CRP Method of administration: IV Push Method of administration may be changed at the discretion of home infusion pharmacist based upon assessment of the patient and/or caregiver's ability to self-administer the medication ordered.   doxycycline  (VIBRA -TABS) 100 mg, Oral, Every 12 hours   DULoxetine  (CYMBALTA ) 60 mg, Daily   ertapenem  (INVANZ ) IVPB 1 g, Intravenous, Every 24 hours, Indication:  L-iliac fossa abscess  First Dose: Yes Last Day of Therapy:  03/30/24 Labs - Once weekly:  CBC/D and BMP, Labs - Once weekly: ESR and  CRP Method of administration: Mini-Bag Plus / Gravity Method of administration may be changed at the discretion of home infusion pharmacist based upon assessment of the patient and/or caregiver's ability to self-administer the medication ordered.   ertapenem  (INVANZ ) 1 g, Intramuscular, Every 24 hours   furosemide  (LASIX ) 40 mg, As needed   gabapentin  (NEURONTIN ) 300 MG capsule TAKE 2 CAPSULES BY MOUTH THREE TIMES DAILY   [Paused] losartan  (COZAAR ) 100 mg, Every morning   ondansetron  (ZOFRAN ) 8 mg, Oral, Every 8 hours PRN   Oxycodone  HCl 10 mg, Every 6 hours PRN   pantoprazole  (PROTONIX ) 40 mg, Oral, Daily   predniSONE (DELTASONE) 5 mg, Daily PRN   rosuvastatin  (CRESTOR ) 10 mg, Oral, Daily   [Paused] spironolactone  (ALDACTONE ) 25 MG tablet Take 1 tablet daily   temazepam  (RESTORIL ) 30 mg, Oral, Daily at bedtime    Diet Orders (From admission, onward)     Start     Ordered   03/21/24 2318  Diet regular Room service appropriate? Yes; Fluid consistency: Thin  Diet effective now       Question Answer Comment  Room service appropriate? Yes   Fluid consistency: Thin      03/21/24 2317            DVT  prophylaxis: SCDs Start: 03/21/24 2318 apixaban  (ELIQUIS ) tablet 5 mg   Lab Results  Component Value Date   PLT 133 (L) 03/25/2024      Code Status: Full Code  Family Communication: no family at bedside   Status is: Inpatient Remains inpatient appropriate because: severity of illness  Level of care: Telemetry Medical  Consultants:  ID Cardiology   Objective: Vitals:   03/25/24 0402 03/25/24 0405 03/25/24 0410 03/25/24 0724  BP: (!) 88/52 (!) 100/49  (!) 91/56  Pulse:    79  Resp: 12 15  14   Temp:  98.3 F (36.8 C)  98.5 F (36.9 C)  TempSrc:  Axillary  Axillary  SpO2: 100% 100%  100%  Weight:   107.1 kg   Height:        Intake/Output Summary (Last 24 hours) at 03/25/2024 0845 Last data filed at 03/25/2024 0600 Gross per 24 hour  Intake 520 ml  Output --   Net 520 ml   Wt Readings from Last 3 Encounters:  03/25/24 107.1 kg  03/12/24 117.5 kg  02/13/24 102.1 kg    Examination:  Constitutional: NAD Eyes: no scleral icterus ENMT: Mucous membranes are moist.  Neck: normal, supple Respiratory: clear to auscultation bilaterally, no wheezing, no crackles. Normal respiratory effort. No accessory muscle use.  Cardiovascular: Regular rate and rhythm, no murmurs / rubs / gallops. No LE edema.  Abdomen: non distended, no tenderness. Bowel sounds positive.   Data Reviewed: I have independently reviewed following labs and imaging studies   CBC Recent Labs  Lab 03/22/24 0012 03/22/24 0500 03/23/24 0740 03/24/24 0543 03/25/24 0225  WBC 11.8* 11.9* 15.4* 14.6* 13.7*  HGB 11.1* 9.9* 10.8* 10.4* 9.7*  HCT 30.4* 27.0* 29.7* 28.2* 26.7*  PLT 183 152 153 122* 133*  MCV 75.1* 74.0* 74.4* 74.8* 75.2*  MCH 27.4 27.1 27.1 27.6 27.3  MCHC 36.5* 36.7* 36.4* 36.9* 36.3*  RDW 16.7* 16.6* 16.7* 16.9* 17.1*  LYMPHSABS 2.3 0.5*  --  3.2  --   MONOABS 0.9 0.5  --  1.3*  --   EOSABS 4.0* 5.0*  --  4.0*  --   BASOSABS 0.1 0.1  --  0.1  --     Recent Labs  Lab 03/22/24 0012 03/22/24 0432 03/22/24 0500 03/22/24 0530 03/22/24 0923 03/23/24 0740 03/23/24 1436 03/24/24 0543 03/25/24 0225  NA 132*  --  133*  --   --  131*  --  131* 131*  K 3.7  --  3.3*  --   --  4.1  --  3.9 3.9  CL 104  --  106  --   --  103  --  105 106  CO2 18*  --  18*  --   --  18*  --  17* 19*  GLUCOSE 88  --  90  --   --  96  --  92 87  BUN 22  --  20  --   --  19  --  20 20  CREATININE 1.36*  --  1.19*  --   --  1.08*  --  0.97 0.93  CALCIUM  7.2*  --  7.1*  --   --  7.7*  --  7.3* 7.6*  AST  --   --  21  --   --  18  --  16 15  ALT  --   --  18  --   --  17  --  16 16  ALKPHOS  --   --  83  --   --  84  --  85 73  BILITOT  --   --  0.9  --   --  0.7  --  1.0 1.1  ALBUMIN   --   --  1.5*  --   --  1.5*  --  <1.5* 1.5*  MG 1.6*  --  1.9  --   --   --   --   --   --    PROCALCITON  --   --  3.50  --   --   --   --   --   --   LATICACIDVEN 2.4* 3.2*  --   --   --   --  1.8  --   --   INR  --   --   --   --  1.8*  --   --   --   --   TSH  --   --   --  3.025  --   --   --   --   --   AMMONIA  --   --   --  19  --   --   --   --   --   BNP  --   --   --  31.2  --   --   --   --   --     ------------------------------------------------------------------------------------------------------------------ No results for input(s): CHOL, HDL, LDLCALC, TRIG, CHOLHDL, LDLDIRECT in the last 72 hours.  Lab Results  Component Value Date   HGBA1C 5.4 11/29/2023   ------------------------------------------------------------------------------------------------------------------ No results for input(s): TSH, T4TOTAL, T3FREE, THYROIDAB in the last 72 hours.  Invalid input(s): FREET3  Cardiac Enzymes No results for input(s): CKMB, TROPONINI, MYOGLOBIN in the last 168 hours.  Invalid input(s): CK ------------------------------------------------------------------------------------------------------------------    Component Value Date/Time   BNP 31.2 03/22/2024 0530    CBG: No results for input(s): GLUCAP in the last 168 hours.  No results found for this or any previous visit (from the past 240 hours).   Radiology Studies: No results found.   Nilda Fendt, MD, PhD [facility]  Between 7 am - 7 pm I am available, please contact me via Amion (for emergencies) or Securechat (non urgent messages)  Between 7 pm - 7 am I am not available, please contact night coverage MD/APP via Amion

## 2024-03-25 NOTE — Progress Notes (Signed)
 Physical Therapy Treatment Patient Details Name: [name] MRN: [id number] DOB: 12-12-1950 Today's Date: 03/25/2024   History of Present Illness 73 y.o. female with metabolic encephalopathy,  lactic acidosis and AKI. Developed  tachycardia. Recently admitted for septic arthritis of the SI joint, then readmitted with vomiting, weakness. PMH: SI osteomyelitis and abscess. CT guided aspiration of L iliacus on 6/20. PMH includes metastatic breast CA to liver and bones in remission, PE, CHF, HTN, anxiety.   PT Comments  Pt in bed upon arrival and agreeable to PT/OT session to progress mobility. MaxAx2 to TotalAx2 required for bed mobility with decreased initiation. Able to sit on EOB for >13 minutes with MinA to CGA for occasional posterior lean. Pt with impaired postural control and difficulty maintaining neck extension with frequent cueing throughout. Pt reported symptoms of dizziness with vitals below. At this time, pt declined stand attempt with discussion about the benefits of getting out of bed. Pt was agreeable to move to the chair in following sessions with maximove pad ordered and air mattress pad recommended. Continue to recommend <3hrs post acute rehab with acute PT to follow.     If plan is discharge home, recommend the following: Two people to help with walking and/or transfers;Two people to help with bathing/dressing/bathroom   Can travel by private vehicle     No  Equipment Recommendations  Hospital bed;Hoyer lift    Recommendations for Other Services  No     Precautions / Restrictions Precautions Precautions: Fall;Other (comment) (porta cath) Recall of Precautions/Restrictions: Impaired Restrictions Weight Bearing Restrictions Per Provider Order: No     Mobility  Bed Mobility Overal bed mobility: Needs Assistance Bed Mobility: Sit to Supine, Supine to Sit, Rolling, Sidelying to Sit Rolling: Total assist Sidelying to sit: Max assist, +2 for safety/equipment   Sit to  supine: +2 for physical assistance, Total assist   General bed mobility comments: pt not able to move/reposition BLEs, assist to roll but pt slightly assisting with side>sit using her RUE to push off.    Transfers    General transfer comment: Pt declined, not open to stedy. Discussed with Diplomatic Services operational officer and placed order for maximove. Pt did report some gravitional insecurity but stated she was open to trying maximove despite explanation on how it worked.      Balance Overall balance assessment: Needs assistance Sitting-balance support: No upper extremity supported, Feet supported Sitting balance-Leahy Scale: Fair Sitting balance - Comments: Pt able to sit with periods of CGA, encouraging anterior weight shifts. intermittent min A with slight posterior lean. Postural control: Posterior lean    Communication Communication Communication: No apparent difficulties  Cognition Arousal: Alert Behavior During Therapy: Flat affect   PT - Cognitive impairments: Orientation, Awareness, Memory, Attention, Initiation, Sequencing, Problem solving, Safety/Judgement   Orientation impairments: Time, Situation    Following commands: Impaired Following commands impaired: Follows one step commands with increased time    Cueing Cueing Techniques: Verbal cues, Gestural cues, Tactile cues, Visual cues  Exercises General Exercises - Lower Extremity Long Arc Quad: AROM, Both, 15 reps, Seated    General Comments General comments (skin integrity, edema, etc.): BP supine 95/60(72), BP sitting EOB 106/73. Pt continued to report dizziness      Pertinent Vitals/Pain Pain Assessment Pain Assessment: Faces Faces Pain Scale: Hurts little more Pain Location: generalized with mobility Pain Descriptors / Indicators: Grimacing, Moaning, Discomfort Pain Intervention(s): Limited activity within patient's tolerance, Monitored during session, Repositioned     PT Goals (current goals can now be found  in the care plan  section) Acute Rehab PT Goals PT Goal Formulation: With patient/family Time For Goal Achievement: 04/07/24 Potential to Achieve Goals: Fair Progress towards PT goals: Progressing toward goals    Frequency    Min 2X/week           Co-evaluation   Reason for Co-Treatment: Complexity of the patient's impairments (multi-system involvement);For patient/therapist safety;To address functional/ADL transfers;Necessary to address cognition/behavior during functional activity PT goals addressed during session: Mobility/safety with mobility;Balance OT goals addressed during session: Strengthening/ROM;Other (comment) (edema)      AM-PAC PT 6 Clicks Mobility   Outcome Measure  Help needed turning from your back to your side while in a flat bed without using bedrails?: Total Help needed moving from lying on your back to sitting on the side of a flat bed without using bedrails?: Total Help needed moving to and from a bed to a chair (including a wheelchair)?: Total Help needed standing up from a chair using your arms (e.g., wheelchair or bedside chair)?: Total Help needed to walk in hospital room?: Total Help needed climbing 3-5 steps with a railing? : Total 6 Click Score: 6    End of Session   Activity Tolerance: Patient tolerated treatment well Patient left: in bed;with call bell/phone within reach;with bed alarm set;with family/visitor present Nurse Communication: Mobility status;Need for lift equipment PT Visit Diagnosis: Muscle weakness (generalized) (M62.81);History of falling (Z91.81)     Time: 8961-8892 PT Time Calculation (min) (ACUTE ONLY): 29 min  Charges:    $Therapeutic Activity: 8-22 mins PT General Charges $$ ACUTE PT VISIT: 1 Visit                     Kate ORN, PT, DPT Secure Chat Preferred  Rehab Office [phone]   Kate BRAVO Wendolyn 03/25/2024, 12:52 PM

## 2024-03-25 NOTE — Progress Notes (Signed)
 SBP was in the 80s, now 70s. Patient does not have any acute complaints. Plan to give albumin  and have the AM labs (CBC and CMP) drawn now.

## 2024-03-25 NOTE — Progress Notes (Signed)
 Occupational Therapy Treatment Patient Details Name: [name] MRN: [id number] DOB: 13-May-1951 Today's Date: 03/25/2024   History of present illness 73 y.o. female with metabolic encephalopathy,  lactic acidosis and AKI. Developed  tachycardia. Recently admitted for septic arthritis of the SI joint, then readmitted with vomiting, weakness. PMH: SI osteomyelitis and abscess. CT guided aspiration of L iliacus on 6/20. PMH includes metastatic breast CA to liver and bones in remission, PE, CHF, HTN, anxiety.   OT comments  Worked with pt on sitting balance today, able to progress to CGA with cues and activities for her to shift her weight anteriorly. She c/o dizziness and fatigues quickly, Bp stable upon assessments, tolerating some strengthening activities sitting EOB >13 mins. Pt declined transfers, asked secretary to procure maximove pads and air matter to reduce risk of pressure wounds and promote OOB activity as able. She remains needing total A+2 for mobility and Adls. OT to continue following pt acutely to progress pt as able. DC plans remain appropriate for skilled.       If plan is discharge home, recommend the following:  Two people to help with walking and/or transfers;Two people to help with bathing/dressing/bathroom;Assistance with feeding;Assistance with cooking/housework   Geophysicist/field seismologist for Other Services      Precautions / Restrictions Precautions Precautions: Fall;Other (comment) (porta cath) Recall of Precautions/Restrictions: Impaired Restrictions Weight Bearing Restrictions Per Provider Order: No       Mobility Bed Mobility Overal bed mobility: Needs Assistance Bed Mobility: Sit to Supine, Supine to Sit, Rolling, Sidelying to Sit Rolling: Total assist Sidelying to sit: Max assist, +2 for safety/equipment   Sit to supine: +2 for physical assistance, Total assist   General bed mobility comments: pt not able to  move/reposition BLEs, assist to roll but pt slightly assisting with side>sit using her RUE to push off.    Transfers                   General transfer comment: Pt declined, not open to stedy. Discussed with Diplomatic Services operational officer and placed order for maximove. Pt did report some gravitional insecurity but stated she was open to trying maximove despite explanation on how it worked.     Balance Overall balance assessment: Needs assistance Sitting-balance support: No upper extremity supported, Feet supported Sitting balance-Leahy Scale: Fair Sitting balance - Comments: Pt able to sit with periods of CGA, encouraging anterior weight shifts. intermittent min A with slight posterior lean. Postural control: Posterior lean                                 ADL either performed or assessed with clinical judgement   ADL                                         General ADL Comments: Total A for all ADLs; attempted to perform some but pt declined    Extremity/Trunk Assessment Upper Extremity Assessment RUE Deficits / Details: significant edema limiting ROM of hand and wrist/elbow. Pt not able to perform any AROM of UE while it was in various positions, she is noted to have a finger on her finger, declined removal-educated her on the risks if swelling continueds ot increase. RUE Coordination: decreased fine motor;decreased gross motor LUE Deficits / Details: similar to RUE, more funtional  and able to use it to perform light grasp, Shoulder flexion AROM limited ~10 deg but elbow flex AROM ~30 with effort. LUE Coordination: decreased fine motor;decreased gross motor            Vision Baseline Vision/History: 1 Wears glasses Vision Assessment?: Wears glasses for reading Additional Comments: will further assess, eyes typically low when in bed. once EOB but head tilted down and not able to maintain it for extended periods   Perception Perception Perception: Impaired    Praxis Praxis Praxis: Impaired   Communication Communication Communication: No apparent difficulties   Cognition Arousal: Alert Behavior During Therapy: Flat affect Cognition: Cognition impaired   Orientation impairments: Time (reoriented pt to today's date) Awareness: Intellectual awareness impaired, Online awareness impaired Memory impairment (select all impairments): Working Civil Service fast streamer, Armed forces training and education officer functioning impairment (select all impairments): Initiation, Organization, Sequencing, Reasoning, Problem solving OT - Cognition Comments: Pt with little task initation, needs cues to help faciliate her engagement in functional tasks/mobility. Additional encouragement/push needed to have pt perform tasks.                 Following commands: Impaired Following commands impaired: Follows one step commands with increased time      Cueing   Cueing Techniques: Verbal cues, Gestural cues, Tactile cues, Visual cues  Exercises General Exercises - Lower Extremity Long Arc Quad: AROM, Both, 15 reps, Seated Other Exercises Other Exercises: MEM to L hand Other Exercises: R lateral elbow prop with push into midline x4 Other Exercises: Dynamic reaching, AAROM using RUE. Ext assist from OT to get more range and anterior weight shift. Other Exercises: Cervical ext x20 sec hold AAROM    Shoulder Instructions       General Comments BP supine 95/60(72), BP sitting EOB 106/73. Pt continued to report dizziness    Pertinent Vitals/ Pain       Pain Assessment Pain Assessment: Faces Faces Pain Scale: Hurts little more Pain Location: generalized with mobility Pain Descriptors / Indicators: Grimacing, Moaning, Discomfort Pain Intervention(s): Monitored during session, Limited activity within patient's tolerance, Repositioned  Home Living                                          Prior Functioning/Environment              Frequency  Min  2X/week        Progress Toward Goals  OT Goals(current goals can now be found in the care plan section)  Progress towards OT goals: Progressing toward goals  Acute Rehab OT Goals Patient Stated Goal: to get some rest OT Goal Formulation: With patient Time For Goal Achievement: 04/07/24 Potential to Achieve Goals: Fair  Plan      Co-evaluation    PT/OT/SLP Co-Evaluation/Treatment: Yes Reason for Co-Treatment: Complexity of the patient's impairments (multi-system involvement);For patient/therapist safety;To address functional/ADL transfers;Necessary to address cognition/behavior during functional activity PT goals addressed during session: Mobility/safety with mobility;Balance OT goals addressed during session: Strengthening/ROM;Other (comment) (edema)      AM-PAC OT 6 Clicks Daily Activity     Outcome Measure   Help from another person eating meals?: Total Help from another person taking care of personal grooming?: Total Help from another person toileting, which includes using toliet, bedpan, or urinal?: Total Help from another person bathing (including washing, rinsing, drying)?: Total Help from another person to put on and taking  off regular upper body clothing?: Total Help from another person to put on and taking off regular lower body clothing?: Total 6 Click Score: 6    End of Session    OT Visit Diagnosis: Unsteadiness on feet (R26.81);Other abnormalities of gait and mobility (R26.89);Muscle weakness (generalized) (M62.81);Other symptoms and signs involving the nervous system (R29.898);Other symptoms and signs involving cognitive function   Activity Tolerance Patient limited by fatigue;Patient limited by lethargy   Patient Left in bed;with call bell/phone within reach;with bed alarm set;with family/visitor present;with SCD's reapplied   Nurse Communication Mobility status;Need for lift equipment (maximove)        Time: 8961-8892 OT Time Calculation (min):  29 min  Charges: OT General Charges $OT Visit: 1 Visit OT Treatments $Therapeutic Activity: 8-22 mins  03/25/2024  AB, OTR/L  Acute Rehabilitation Services  Office: [phone]   Curtistine JONETTA Das 03/25/2024, 12:04 PM

## 2024-03-25 NOTE — Progress Notes (Incomplete)
 Initial Nutrition Assessment  DOCUMENTATION CODES:      INTERVENTION:  ***   NUTRITION DIAGNOSIS:     related to   as evidenced by  .  GOAL:      MONITOR:      REASON FOR ASSESSMENT:   Consult Assessment of nutrition requirement/status  ASSESSMENT:      ***   Admit weight: *** Current weight: 107.1 kg    Average Meal Intake: 7/28: 0% intake x 3 recorded meals  Nutritionally Relevant Medications: Scheduled Meds:  amiodarone   200 mg Oral BID   apixaban   5 mg Oral BID   Chlorhexidine  Gluconate Cloth  6 each Topical Daily   doxycycline   100 mg Oral Q12H   hydrocortisone  cream   Topical BID   metoprolol  succinate  25 mg Oral Daily   sodium chloride  flush  10-40 mL Intracatheter Q12H   Continuous Infusions:  ertapenem  1 g (03/25/24 1002)   PRN Meds:.acetaminophen  **OR** acetaminophen , diphenhydrAMINE , diphenhydrAMINE -zinc  acetate, melatonin, menthol -cetylpyridinium, ondansetron  (ZOFRAN ) IV, oxyCODONE   Labs Reviewed: *** CBG ranges from ***-*** mg/dL over the last 24 hours HgbA1c ***  NUTRITION - FOCUSED PHYSICAL EXAM:  {RD Focused Exam List:21252}  Diet Order:   Diet Order             Diet regular Room service appropriate? Yes; Fluid consistency: Thin  Diet effective now                   EDUCATION NEEDS:      Skin:     Last BM:     Height:   Ht Readings from Last 1 Encounters:  03/21/24 5' 3 (1.6 m)    Weight:   Wt Readings from Last 1 Encounters:  03/25/24 107.1 kg    Ideal Body Weight:     BMI:  Body mass index is 41.83 kg/m.  Estimated Nutritional Needs:   Kcal:     Protein:     Fluid:       ***

## 2024-03-26 DIAGNOSIS — Z515 Encounter for palliative care: Secondary | ICD-10-CM | POA: Diagnosis not present

## 2024-03-26 DIAGNOSIS — K3533 Acute appendicitis with perforation and localized peritonitis, with abscess: Secondary | ICD-10-CM

## 2024-03-26 DIAGNOSIS — G9340 Encephalopathy, unspecified: Secondary | ICD-10-CM | POA: Diagnosis not present

## 2024-03-26 DIAGNOSIS — Z7189 Other specified counseling: Secondary | ICD-10-CM | POA: Diagnosis not present

## 2024-03-26 DIAGNOSIS — I4719 Other supraventricular tachycardia: Secondary | ICD-10-CM

## 2024-03-26 LAB — CBC
HCT: 24 % — ABNORMAL LOW (ref 36.0–46.0)
Hemoglobin: 8.7 g/dL — ABNORMAL LOW (ref 12.0–15.0)
MCH: 27.2 pg (ref 26.0–34.0)
MCHC: 36.3 g/dL — ABNORMAL HIGH (ref 30.0–36.0)
MCV: 75 fL — ABNORMAL LOW (ref 80.0–100.0)
Platelets: 141 K/uL — ABNORMAL LOW (ref 150–400)
RBC: 3.2 MIL/uL — ABNORMAL LOW (ref 3.87–5.11)
RDW: 16.9 % — ABNORMAL HIGH (ref 11.5–15.5)
WBC: 10.4 K/uL (ref 4.0–10.5)
nRBC: 0 % (ref 0.0–0.2)

## 2024-03-26 LAB — COMPREHENSIVE METABOLIC PANEL WITH GFR
ALT: 15 U/L (ref 0–44)
AST: 17 U/L (ref 15–41)
Albumin: 1.9 g/dL — ABNORMAL LOW (ref 3.5–5.0)
Alkaline Phosphatase: 66 U/L (ref 38–126)
Anion gap: 8 (ref 5–15)
BUN: 12 mg/dL (ref 8–23)
CO2: 20 mmol/L — ABNORMAL LOW (ref 22–32)
Calcium: 7.9 mg/dL — ABNORMAL LOW (ref 8.9–10.3)
Chloride: 107 mmol/L (ref 98–111)
Creatinine, Ser: 0.64 mg/dL (ref 0.44–1.00)
GFR, Estimated: >60 mL/min (ref >60)
Glucose, Bld: 88 mg/dL (ref 70–99)
Potassium: 3.5 mmol/L (ref 3.5–5.1)
Sodium: 135 mmol/L (ref 135–145)
Total Bilirubin: 1.2 mg/dL (ref 0.0–1.2)
Total Protein: 4.3 g/dL — ABNORMAL LOW (ref 6.5–8.1)

## 2024-03-26 LAB — MAGNESIUM: Magnesium: 1.6 mg/dL — ABNORMAL LOW (ref 1.7–2.4)

## 2024-03-26 MED ORDER — ENSURE PLUS HIGH PROTEIN PO LIQD
237.0000 mL | Freq: Two times a day (BID) | ORAL | Status: DC
  Administered 2024-03-26 – 2024-03-29 (×6): 237 mL via ORAL

## 2024-03-26 MED ORDER — MAGNESIUM SULFATE 2 GM/50ML IV SOLN
2.0000 g | Freq: Once | INTRAVENOUS | Status: AC
  Administered 2024-03-26: 2 g via INTRAVENOUS
  Filled 2024-03-26: qty 50

## 2024-03-26 MED ORDER — POTASSIUM CHLORIDE CRYS ER 20 MEQ PO TBCR
40.0000 meq | EXTENDED_RELEASE_TABLET | Freq: Once | ORAL | Status: AC
  Administered 2024-03-26: 40 meq via ORAL
  Filled 2024-03-26: qty 2

## 2024-03-26 MED ORDER — ADULT MULTIVITAMIN W/MINERALS CH
1.0000 | ORAL_TABLET | Freq: Every day | ORAL | Status: DC
  Administered 2024-03-26 – 2024-03-29 (×4): 1 via ORAL
  Filled 2024-03-26 (×4): qty 1

## 2024-03-26 NOTE — Progress Notes (Signed)
 PROGRESS NOTE  [name] FMW:[id number] DOB: 1951-03-12 DOA: 03/21/2024 PCP: Jefferey Fitch, MD   LOS: 5 days   Brief Narrative / Interim history: 73 year old female with breast cancer now in remission, recently admitted for septic arthritis of SI joint and sacral osteomyelitis, diastolic CHF, history of PE on Eliquis  here with an unresponsive episode.  She has had few hospitalizations this year in May, June, July, diagnosed with SI septic arthritis currently on 6 weeks of antibiotics.  She was readmitted 7/18-22 for vomiting, CT showed thickening of the esophagus, she improved with conservative management.  Of note, she had an EGD in May 2025 for vomiting/hematemesis, and it showed congestive gastropathy only.  Seen by ID as an outpatient on 7/25, due to rash Rocephin  was changed to ertapenem .  On the day of admission patient was found to be slumped on the toilet, groggy, and out of responsiveness.  She was brought to the ER and admitted to the hospital.  Was found to have elevated lactic acid  Subjective / 24h Interval events: She is doing well today, alert, wants to have a bath and otherwise has no medical complaints  Assesement and Plan: Principal Problem:   Acute metabolic encephalopathy Active Problems:   Septic arthritis of sacroiliac joint (HCC)   Rash   Narrow complex tachycardia (HCC)   Essential hypertension   Chronic diastolic CHF (congestive heart failure) (HCC)   Breast cancer metastasized to multiple sites (HCC)   Anemia of chronic disease   Obesity, Class III, BMI 40-49.9 (morbid obesity)   History of pulmonary embolism   Hypokalemia   Hypomagnesemia   Bacteremia due to group B Streptococcus   HLD (hyperlipidemia)   Lactic acidosis   AKI (acute kidney injury) (HCC)   GAD (generalized anxiety disorder)   Hyponatremia  Principal problem Unresponsive episode, lactic acidosis -patient was found to have tachyarrhythmia, see below, which may have contributed to  her unresponsive episode.  Lactate was very elevated on admission, unclear reason, not septic.  Lactate improved  Active problems Narrow complex tachycardia -Coreg  was hold on admission due to hypotension.  She developed narrow complex tachycardia, resolved with diltiazem .  Cardiology suspected atrial tachycardia, possibly junctional tachycardia.  Her regimen was changed to metoprolol  and amiodarone , no further episodes on telemetry.  Due to intermittent hypotension, metoprolol  is on hold and continue amiodarone  alone  Acute metabolic encephalopathy, generalized weakness, FTT -she seems to have had a broader decline in function.  She has been having poor p.o. intake in the last 4 months as well.  Palliative consulted, appreciate input.  PT/OT evaluation recommends SNF, she is stable for discharge to SNF, placement pending  Septic arthritis of sacroiliac joint, group B strep bacteremia -currently on ertapenem  and doxycycline .  End date 03/30/2024  Rash, eosinophilia - This developed in the last few weeks but patient didn't note it until after the last admission.  ID suspect it is Rocephin  allergy.  DRESS ruled out.      Hyponatremia - Mild, asymptomatic   GAD - Hold Xanax , duloxetine    AKI (acute kidney injury) (HCC) - Creatinine 1.3 on admission up from baseline 0.8. Improved to baseline with holding diuretics, losartan    HLD - Hold Crestor  for now   Hypomagnesemia -magnesium  again today   Hypokalemia - Keep K>4.  Potassium stable, but will supplement   History of pulmonary embolism - Continue home Eliquis    Obesity, Class III - BMI 41.8, complicates care   Anemia of chronic disease - Hgb stable,  no clinical bleeding   Scheduled Meds:  amiodarone   200 mg Oral BID   apixaban   5 mg Oral BID   Chlorhexidine  Gluconate Cloth  6 each Topical Daily   doxycycline   100 mg Oral Q12H   feeding supplement  237 mL Oral BID BM   hydrocortisone  cream   Topical BID   multivitamin with minerals  1  tablet Oral Daily   sodium chloride  flush  10-40 mL Intracatheter Q12H   Continuous Infusions:  ertapenem  1 g (03/25/24 1002)   PRN Meds:.acetaminophen  **OR** acetaminophen , diphenhydrAMINE , diphenhydrAMINE -zinc  acetate, melatonin, menthol -cetylpyridinium, ondansetron  (ZOFRAN ) IV, oxyCODONE   Current Outpatient Medications  Medication Instructions   acetaminophen  (TYLENOL ) 500-1,000 mg, Every 6 hours PRN   albuterol  (VENTOLIN  HFA) 108 (90 Base) MCG/ACT inhaler 2 puffs, Inhalation, Every 6 hours PRN   ALPRAZolam  (XANAX ) 1 mg, Oral, 3 times daily PRN   amLODipine  (NORVASC ) 10 mg, Oral, Every morning   apixaban  (ELIQUIS ) 5 mg, Oral, 2 times daily   CALCIUM  PO 2 tablets, Daily   carvedilol  (COREG ) 12.5 mg, Oral, 2 times daily with meals   cefTRIAXone  (ROCEPHIN ) IVPB 2 g, Intravenous, Every 24 hours, Indication:  L-iliac fossa abscess First Dose: Yes Last Day of Therapy:  03/30/24 Labs - Once weekly:  CBC/D and BMP, Labs - Once weekly- ESR and CRP Method of administration: IV Push Method of administration may be changed at the discretion of home infusion pharmacist based upon assessment of the patient and/or caregiver's ability to self-administer the medication ordered.   cyclobenzaprine  (FLEXERIL ) 5 mg, Oral, 2 times daily PRN   daptomycin  (CUBICIN ) IVPB 700 mg, Intravenous, Every 24 hours, Indication:  L-iliac fossa abscess First Dose: Yes Last Day of Therapy:  03/30/24 Labs - Once weekly:  CBC/D, BMP, and CPK Labs - Once weekly- ESR and CRP Method of administration: IV Push Method of administration may be changed at the discretion of home infusion pharmacist based upon assessment of the patient and/or caregiver's ability to self-administer the medication ordered.   doxycycline  (VIBRA -TABS) 100 mg, Oral, Every 12 hours   DULoxetine  (CYMBALTA ) 60 mg, Daily   ertapenem  (INVANZ ) IVPB 1 g, Intravenous, Every 24 hours, Indication:  L-iliac fossa abscess  First Dose: Yes Last Day of Therapy:   03/30/24 Labs - Once weekly:  CBC/D and BMP, Labs - Once weekly: ESR and CRP Method of administration: Mini-Bag Plus / Gravity Method of administration may be changed at the discretion of home infusion pharmacist based upon assessment of the patient and/or caregiver's ability to self-administer the medication ordered.   ertapenem  (INVANZ ) 1 g, Intramuscular, Every 24 hours   furosemide  (LASIX ) 40 mg, As needed   gabapentin  (NEURONTIN ) 300 MG capsule TAKE 2 CAPSULES BY MOUTH THREE TIMES DAILY   [Paused] losartan  (COZAAR ) 100 mg, Every morning   ondansetron  (ZOFRAN ) 8 mg, Oral, Every 8 hours PRN   Oxycodone  HCl 10 mg, Every 6 hours PRN   pantoprazole  (PROTONIX ) 40 mg, Oral, Daily   predniSONE (DELTASONE) 5 mg, Daily PRN   rosuvastatin  (CRESTOR ) 10 mg, Oral, Daily   [Paused] spironolactone  (ALDACTONE ) 25 MG tablet Take 1 tablet daily   temazepam  (RESTORIL ) 30 mg, Oral, Daily at bedtime    Diet Orders (From admission, onward)     Start     Ordered   03/26/24 0926  Diet regular Room service appropriate? Yes with Assist; Fluid consistency: Thin  Diet effective now       Question Answer Comment  Room service appropriate? Yes with Assist  Fluid consistency: Thin      03/26/24 0926            DVT prophylaxis: SCDs Start: 03/21/24 2318 apixaban  (ELIQUIS ) tablet 5 mg   Lab Results  Component Value Date   PLT 141 (L) 03/26/2024      Code Status: Full Code  Family Communication: no family at bedside   Status is: Inpatient Remains inpatient appropriate because: severity of illness  Level of care: Telemetry Medical  Consultants:  ID Cardiology   Objective: Vitals:   03/26/24 0009 03/26/24 0100 03/26/24 0321 03/26/24 0500  BP: 107/67 106/64 110/69   Pulse: 78 82 82   Resp: 12 16 14    Temp:  97.7 F (36.5 C) 97.7 F (36.5 C)   TempSrc:  Axillary Axillary   SpO2: 100% 100% 100%   Weight:    106.6 kg  Height:        Intake/Output Summary (Last 24 hours) at 03/26/2024  1053 Last data filed at 03/26/2024 0500 Gross per 24 hour  Intake 240.84 ml  Output 150 ml  Net 90.84 ml   Wt Readings from Last 3 Encounters:  03/26/24 106.6 kg  03/12/24 117.5 kg  02/13/24 102.1 kg    Examination:  Constitutional: NAD Eyes: lids and conjunctivae normal, no scleral icterus ENMT: mmm Neck: normal, supple Respiratory: clear to auscultation bilaterally, no wheezing, no crackles. Normal respiratory effort.  Cardiovascular: Regular rate and rhythm, no murmurs / rubs / gallops. No LE edema. Abdomen: soft, no distention, no tenderness. Bowel sounds positive.   Data Reviewed: I have independently reviewed following labs and imaging studies   CBC Recent Labs  Lab 03/22/24 0012 03/22/24 0500 03/23/24 0740 03/24/24 0543 03/25/24 0225 03/26/24 0435  WBC 11.8* 11.9* 15.4* 14.6* 13.7* 10.4  HGB 11.1* 9.9* 10.8* 10.4* 9.7* 8.7*  HCT 30.4* 27.0* 29.7* 28.2* 26.7* 24.0*  PLT 183 152 153 122* 133* 141*  MCV 75.1* 74.0* 74.4* 74.8* 75.2* 75.0*  MCH 27.4 27.1 27.1 27.6 27.3 27.2  MCHC 36.5* 36.7* 36.4* 36.9* 36.3* 36.3*  RDW 16.7* 16.6* 16.7* 16.9* 17.1* 16.9*  LYMPHSABS 2.3 0.5*  --  3.2  --   --   MONOABS 0.9 0.5  --  1.3*  --   --   EOSABS 4.0* 5.0*  --  4.0*  --   --   BASOSABS 0.1 0.1  --  0.1  --   --     Recent Labs  Lab 03/22/24 0012 03/22/24 0432 03/22/24 0500 03/22/24 0530 03/22/24 0923 03/23/24 0740 03/23/24 1436 03/24/24 0543 03/25/24 0225 03/26/24 0435  NA 132*  --  133*  --   --  131*  --  131* 131* 135  K 3.7  --  3.3*  --   --  4.1  --  3.9 3.9 3.5  CL 104  --  106  --   --  103  --  105 106 107  CO2 18*  --  18*  --   --  18*  --  17* 19* 20*  GLUCOSE 88  --  90  --   --  96  --  92 87 88  BUN 22  --  20  --   --  19  --  20 20 12   CREATININE 1.36*  --  1.19*  --   --  1.08*  --  0.97 0.93 0.64  CALCIUM  7.2*  --  7.1*  --   --  7.7*  --  7.3* 7.6* 7.9*  AST  --   --  21  --   --  18  --  16 15 17   ALT  --   --  18  --   --  17  --  16  16 15   ALKPHOS  --   --  83  --   --  84  --  85 73 66  BILITOT  --   --  0.9  --   --  0.7  --  1.0 1.1 1.2  ALBUMIN   --   --  1.5*  --   --  1.5*  --  <1.5* 1.5* 1.9*  MG 1.6*  --  1.9  --   --   --   --   --   --  1.6*  PROCALCITON  --   --  3.50  --   --   --   --   --   --   --   LATICACIDVEN 2.4* 3.2*  --   --   --   --  1.8  --   --   --   INR  --   --   --   --  1.8*  --   --   --   --   --   TSH  --   --   --  3.025  --   --   --   --   --   --   AMMONIA  --   --   --  19  --   --   --   --   --   --   BNP  --   --   --  31.2  --   --   --   --   --   --     ------------------------------------------------------------------------------------------------------------------ No results for input(s): CHOL, HDL, LDLCALC, TRIG, CHOLHDL, LDLDIRECT in the last 72 hours.  Lab Results  Component Value Date   HGBA1C 5.4 11/29/2023   ------------------------------------------------------------------------------------------------------------------ No results for input(s): TSH, T4TOTAL, T3FREE, THYROIDAB in the last 72 hours.  Invalid input(s): FREET3  Cardiac Enzymes No results for input(s): CKMB, TROPONINI, MYOGLOBIN in the last 168 hours.  Invalid input(s): CK ------------------------------------------------------------------------------------------------------------------    Component Value Date/Time   BNP 31.2 03/22/2024 0530    CBG: No results for input(s): GLUCAP in the last 168 hours.  No results found for this or any previous visit (from the past 240 hours).   Radiology Studies: No results found.   Nilda Fendt, MD, PhD [facility]  Between 7 am - 7 pm I am available, please contact me via Amion (for emergencies) or Securechat (non urgent messages)  Between 7 pm - 7 am I am not available, please contact night coverage MD/APP via Amion

## 2024-03-26 NOTE — Plan of Care (Signed)

## 2024-03-26 NOTE — Progress Notes (Signed)
 Occupational Therapy Treatment Patient Details Name: [name] MRN: [id number] DOB: 02-29-1951 Today's Date: 03/26/2024   History of present illness 73 y.o. female with metabolic encephalopathy,  lactic acidosis and AKI. Developed  tachycardia. Recently admitted for septic arthritis of the SI joint, then readmitted with vomiting, weakness. PMH: SI osteomyelitis and abscess. CT guided aspiration of L iliacus on 6/20. PMH includes metastatic breast CA to liver and bones in remission, PE, CHF, HTN, anxiety.   OT comments  Pt continues to require max A +2 to total A +2 for mobility, increased time needed in session for optimal pt positioning/repositioning for pt safety. She was able to make slow progress today, standing at bedside with max A+2 and the stedy. Her BUEs remain limited and needs ext assist to hang onto the stedy device, limited use of L hand but it has light grasp capacity. Issued yellow therasponge to promote AROM and reduce edema, pt BLEs elevated to help reduce swelling as well, she has limited knee flexion. OT to continue to progress pt as able. Patient will benefit from continued inpatient follow up therapy, <3 hours/day       If plan is discharge home, recommend the following:  Two people to help with walking and/or transfers;Two people to help with bathing/dressing/bathroom;Assistance with feeding;Assistance with cooking/housework   Geophysicist/field seismologist for Other Services      Precautions / Restrictions Precautions Precautions: Fall;Other (comment) (porta cath) Recall of Precautions/Restrictions: Impaired Restrictions Weight Bearing Restrictions Per Provider Order: No       Mobility Bed Mobility Overal bed mobility: Needs Assistance Bed Mobility: Sit to Supine, Supine to Sit, Sidelying to Sit, Rolling Rolling: Total assist Sidelying to sit: Max assist, +2 for safety/equipment Supine to sit: +2 for physical assistance, Total  assist Sit to supine: +2 for physical assistance, Total assist   General bed mobility comments: Pt initally rolled to EOB, hip positioned too far forward and pt at risk of sliding, pt was helicoptered back into bed, repositioned then helicoptered back to EOB for better positioning    Transfers Overall transfer level: Needs assistance Equipment used: Ambulation equipment used Laurent) Transfers: Sit to/from Stand Sit to Stand: Via Financial trader, Max assist, +2 physical assistance, +2 safety/equipment           General transfer comment: maxA +2 with use of bed pads to raise hips, anterior lean when standing and needing ext assist for RUE placement on stedy. Deferred transfer as her fecal management system became dislodged and uncertain if pt needed to return to bed for replacement.     Balance Overall balance assessment: Needs assistance Sitting-balance support: No upper extremity supported, Feet supported Sitting balance-Leahy Scale: Fair     Standing balance support: During functional activity, Bilateral upper extremity supported, Reliant on assistive device for balance Standing balance-Leahy Scale: Poor Standing balance comment: reliant on ext assist.                           ADL either performed or assessed with clinical judgement   ADL                                         General ADL Comments: Total A for all ADLs;    Extremity/Trunk Assessment Upper Extremity Assessment RUE Deficits / Details: significant edema limiting ROM of  hand and wrist/elbow. Pt not able to perform any AROM of UE while it was in various positions, she is noted to have a finger on her finger, declined removal-educated her on the risks if swelling continueds ot increase. RUE Coordination: decreased fine motor;decreased gross motor LUE Deficits / Details: similar to RUE, more funtional and able to use it to perform light grasp, Shoulder flexion AROM limited ~10 deg but  elbow flex AROM ~30 with effort. LUE Coordination: decreased fine motor;decreased gross motor            Vision   Additional Comments: will further assess   Perception Perception Perception: Impaired Perception-Other Comments: impaired R/L discrmination, pt c/o pain in L shin but she continued to indicate that it was her RLE although she really did mean her L.   Praxis Praxis Praxis: Impaired Praxis Impairment Details: Motor planning   Communication Communication Communication: No apparent difficulties   Cognition Arousal: Alert Behavior During Therapy: Flat affect Cognition: Cognition impaired     Awareness: Intellectual awareness impaired, Online awareness impaired Memory impairment (select all impairments): Working Civil Service fast streamer, Conservation officer, historic buildings Attention impairment (select first level of impairment): Focused attention Executive functioning impairment (select all impairments): Initiation, Organization, Sequencing, Reasoning, Problem solving                   Following commands: Impaired Following commands impaired: Follows one step commands with increased time      Cueing   Cueing Techniques: Verbal cues, Gestural cues, Tactile cues, Visual cues  Exercises Other Exercises Other Exercises: yellow therasponge squeezes bilat hands    Shoulder Instructions       General Comments RN notified that fecal system became dislodged.    Pertinent Vitals/ Pain       Pain Assessment Pain Assessment: Faces Faces Pain Scale: Hurts little more Pain Location: L shin with pressure against it Pain Descriptors / Indicators: Grimacing, Discomfort, Tender Pain Intervention(s): Limited activity within patient's tolerance, Monitored during session, Repositioned  Home Living                                          Prior Functioning/Environment              Frequency  Min 2X/week        Progress Toward Goals  OT Goals(current goals  can now be found in the care plan section)  Progress towards OT goals: Progressing toward goals (slow progress)  Acute Rehab OT Goals OT Goal Formulation: With patient Time For Goal Achievement: 04/07/24 Potential to Achieve Goals: Fair  Plan      Co-evaluation                 AM-PAC OT 6 Clicks Daily Activity     Outcome Measure   Help from another person eating meals?: Total Help from another person taking care of personal grooming?: Total Help from another person toileting, which includes using toliet, bedpan, or urinal?: Total Help from another person bathing (including washing, rinsing, drying)?: Total Help from another person to put on and taking off regular upper body clothing?: Total Help from another person to put on and taking off regular lower body clothing?: Total 6 Click Score: 6    End of Session Equipment Utilized During Treatment: Gait belt;Other (comment) (stedy)  OT Visit Diagnosis: Unsteadiness on feet (R26.81);Other abnormalities of gait and mobility (R26.89);Muscle weakness (generalized) (  M62.81);Other symptoms and signs involving the nervous system (R29.898);Other symptoms and signs involving cognitive function   Activity Tolerance Patient tolerated treatment well   Patient Left in bed;with call bell/phone within reach;with bed alarm set;with family/visitor present;with SCD's reapplied   Nurse Communication Mobility status;Need for lift equipment (maximove)        Time: 8648-8562 OT Time Calculation (min): 46 min  Charges: OT General Charges $OT Visit: 1 Visit OT Treatments $Therapeutic Activity: 38-52 mins  03/26/2024  AB, OTR/L  Acute Rehabilitation Services  Office: [phone]   Curtistine JONETTA Das 03/26/2024, 4:54 PM

## 2024-03-26 NOTE — Consult Note (Signed)
 Palliative Care Consult Note                                  Date: 03/26/2024   Patient Name: [name]  DOB: 02/19/1951  MRN: [id number]  Age / Sex: 73 y.o., female  PCP: Jefferey Fitch, MD Referring Physician: Trixie Nilda HERO, MD  Reason for Consultation: Establishing goals of care  Past Medical History:  Diagnosis Date   Anemia    pt pt report   Anxiety    Bacteremia due to group B Streptococcus    June '25   Breast cancer (HCC)    mets to liver and lung   Breast cancer metastasized to multiple sites [facility]) 02/26/2013   Cellulitis    CHF (congestive heart failure) (HCC)    History of chemotherapy 09/2004   taxotere/herceptin /carboplatin   Hypertension    Neuropathy    Osteomyelitis of sacrum (HCC)    Radiation 07/31/2006   left upper chest   Radiation 06/17/2006-06/27/2006   6480 cGy bilat. chest wall   Septic arthritis (HCC)    left SI joint   SVC syndrome    Thrombosis     Subjective:   This NP Camellia Kays reviewed medical records, received report from team, assessed the patient and then meet at the patient's bedside to discuss diagnosis, prognosis, GOC, EOL wishes disposition and options.  Before meeting with the patient/family, I spent time reviewing the chart including TOC note from 03/24/2024, hospitalist note from yesterday, hospitalist note from today, dietitian note from today, PT/OT notes from yesterday.  The patient suffered an unresponsive episode and came in with elevated lactic acid, unknown etiology, doubt sepsis.  Also with altered mental status, generalized weakness, failure to thrive.  Recent SI joint septic arthritis on chronic antibiotics.  She seems to be making improvement today and more awake/alert.  I met with  the patient is a bedside, the patient's husband Kimberley and granddaughter Wynelle are present.   We meet to discuss diagnosis prognosis, GOC, EOL wishes, disposition and options.  Concept of Palliative Care was introduced as specialized medical care for people and their families living with serious illness.  If focuses on providing relief from the symptoms and stress of a serious illness.  The goal is to improve quality of life for both the patient and the family. Values and goals of care important to patient and family were attempted to be elicited.  Created space and opportunity for patient  and family to explore thoughts and feelings regarding current medical situation   Natural trajectory and current clinical status were discussed. Questions and concerns addressed. Patient  encouraged to call with questions or concerns.    Patient/Family Understanding of Illness: They understand that she is here because she had an allergic reaction to a medication (later felt to be antibiotic).  She recently started this new medication and had to come back to the hospital.  She was found unresponsive with low blood pressure and originally went to [facility] but then was transferred to [facility].  Patient time discussing her episode of unresponsiveness, tachycardia, AME and weakness, and other aspects.  Also discussed her septic arthritis and plan for antibiotics through 8/4.  Life Review: The patient has been married to her husband for 52 years.  They have 4 sons and 1 daughter, 14 grandchildren as well.  She previously worked as a Research scientist (physical sciences) and then later  at [employer] in [place].  The thing that brings her much joy is being around her family.  Patient Values: Family  Goals: To get better, get out of the hospital, get to rehab.  Goal is to strengthen to the able to get back home to her functional and independent life.  Today's Discussion: Patient was interactive but appears tired, husband contributed significantly to the history.  In addition to discussion as described above we had significant discussion on various topics.  At baseline the patient states that she  lives at home with her husband and is fully functional in her ADLs.  She denies any recent decline leading up to her hospitalization.  She states that her appetite is normally good and she normally drinks ample amounts of water .  However, since being in the hospital her appetite is falling off significantly.  We spent a lot of time talking about appetite and the fact that she is just not wanting the food.  We discussed the need to eat to the strengthening get better.  We also discussed rehab and needing to work with them to get stronger and to get better, reminding her of the goal to get stronger and back to her functional life.  Overall she feels that she was doing okay until this admission.  She thinks the antibiotics were the cause.  At this point after discussing options and goals, their goal is for medical improvement and discharge back to her functional life.  They are agreeable to rehab which is being evaluated by TOC.  They admit that they have good support at home with multiple family present.  They are hopeful to be able to be successful in reaching her goals.  We did discuss that if she has a significant clinical decline or is unable to get stronger then they may warrant further goals of care conversations and they understand.  I shared that I would follow-up in 2 days to check on the patient's progress and have any further discussions needed. I provided emotional and general support through therapeutic listening, empathy, sharing of stories, and other techniques. I answered all questions and addressed all concerns to the best of my ability.  Per the request after seeing the patient and family I went to [facility] to express their preference for rehab-per.  TOC notes that they will follow-up with the family shortly at the bedside.  Review of Systems  Constitutional:  Positive for fatigue.       Denies pain in general  Respiratory:  Negative for shortness of breath.   Gastrointestinal:  Negative for  abdominal pain, nausea and vomiting.  Neurological:  Positive for weakness.    Objective:   Primary Diagnoses: Present on Admission:  Acute metabolic encephalopathy  Lactic acidosis  Hypomagnesemia  AKI (acute kidney injury) (HCC)  Bacteremia due to group B Streptococcus  Essential hypertension  HLD (hyperlipidemia)  Chronic diastolic CHF (congestive heart failure) (HCC)  History of pulmonary embolism  GAD (generalized anxiety disorder)  Anemia of chronic disease  Hypokalemia  Obesity, Class III, BMI 40-49.9 (morbid obesity)  Septic arthritis of sacroiliac joint (HCC)  Breast cancer metastasized to multiple sites [facility])  Rash   Vital Signs:  BP 109/71 (BP Location: Right Wrist)   Pulse 74   Temp 98.1 F (36.7 C) (Oral)   Resp 18   Ht 5' 3 (1.6 m)   Wt 106.6 kg   SpO2 99%   BMI 41.63 kg/m   Physical Exam Vitals and nursing note reviewed.  Constitutional:      General: She is not in acute distress.    Appearance: She is ill-appearing.  HENT:     Head: Normocephalic and atraumatic.  Cardiovascular:     Rate and Rhythm: Normal rate.  Pulmonary:     Effort: Pulmonary effort is normal. No respiratory distress.     Breath sounds: Normal breath sounds.  Abdominal:     General: Abdomen is flat. There is no distension.     Palpations: Abdomen is soft.  Skin:    General: Skin is warm and dry.  Neurological:     General: No focal deficit present.     Mental Status: She is alert.  Psychiatric:        Mood and Affect: Mood normal.        Behavior: Behavior normal.     Palliative Assessment/Data: 50%   Advanced Care Planning:   Existing Vynca/ACP Documentation: None  Primary Decision Maker: PATIENT  Pertinent diagnosis: Unresponsive episode, metabolic encephalopathy, altered mental status, failure to thrive, AKI, SI joint septic arthritis  The patient and/or family consented to a voluntary Advance Care Planning Conversation in person. Individuals present  for the conversation:  Summary of the conversation: Discussed CODE STATUS, current clinical situation, options in the past moving forward, goals of care  Outcome of the conversations and/or documents completed: Remain full code, full scope of care.  Agreeable to rehab for strengthening and improvement.  At this point open to all offered and available medical interventions for clinical improvement.  I spent 20 minutes providing separately identifiable ACP services with the patient and/or surrogate decision maker in a voluntary, in-person conversation discussing the patient's wishes and goals as detailed in the above note.  Assessment & Plan:   HPI/Patient Profile: 73 y.o. female  with past medical history of breast cancer now in remission, recently admitted for septic arthritis of SI joint and sacral osteomyelitis, diastolic CHF, history of PE on Eliquis .  She presented with an unresponsive episode and was admitted on 03/21/2024 with acute metabolic encephalopathy, septic arthritis and sacroiliac joint, lactic acidosis, narrow complex tachycardia, generalized weakness, failure to thrive, AKI and others.   Palliative medicine was consulted for GOC conversations  SUMMARY OF RECOMMENDATIONS   Full code Full scope of care Open to rehab, preference for facility in [place] Currently open to available and offered medical interventions Palliative medicine will follow-up in 2 days Please notify us  of any significant clinical change or new palliative needs in the interim  Symptom Management:  Per primary team PMD is available to assist as needed  Code Status: Full code  Prognosis:  Unable to determine  Discharge Planning:  SNF/Rehab   Discussed with: Patient, family, medical team, nursing team, [facility] team    Thank you for allowing us  to participate in the care of ELVI LEVENTHAL PMT will continue to support holistically.  Billing based on MDM: Moderate  Detailed review of medical  records (labs, imaging, vital signs), medically appropriate exam, discussed with treatment team, counseling and education to patient, family, & staff, documenting clinical information, medication management, coordination of care  Signed by: Camellia Kays, NP Palliative Medicine Team  Team Phone # [phone] (Nights/Weekends)  03/26/2024, 3:59 PM

## 2024-03-26 NOTE — TOC Progression Note (Signed)
 Transition of Care [facility]) - Progression Note    Patient Details  Name: [name] MRN: [id number] Date of Birth: 1951-01-26  Transition of Care [facility]) CM/SW Contact  Almarie CHRISTELLA Goodie, [place] Phone Number: 03/26/2024, 3:27 PM  Clinical Narrative:   CSW attempted to reach daughter, Edsel, to discuss SNF offers, left a voicemail. CSW met with patient's spouse at bedside to provide bed offers. Spouse interested in [facility] if they have a bed available. CSW contacted [facility] as referral is still pending, asked them to review. CSW to follow.    Expected Discharge Plan: Skilled Nursing Facility Barriers to Discharge: Continued Medical Work up               Expected Discharge Plan and Services In-house Referral: Clinical Social Work Discharge Planning Services: CM Consult Post Acute Care Choice: Skilled Nursing Facility Living arrangements for the past 2 months: Single Family Home                                       Social Drivers of Health (SDOH) Interventions SDOH Screenings   Food Insecurity: No Food Insecurity (03/22/2024)  Housing: Low Risk  (03/22/2024)  Transportation Needs: No Transportation Needs (03/22/2024)  Utilities: Not At Risk (03/22/2024)  Depression (PHQ2-9): Low Risk  (03/19/2024)  Social Connections: Moderately Integrated (03/22/2024)  Recent Concern: Social Connections - Moderately Isolated (02/14/2024)  Tobacco Use: Low Risk  (03/21/2024)    Readmission Risk Interventions    03/16/2024    4:38 PM 02/14/2024   12:50 PM 01/21/2024    1:47 PM  Readmission Risk Prevention Plan  Transportation Screening Complete Complete Complete  PCP or Specialist Appt within 3-5 Days   Complete  HRI or Home Care Consult   Complete  Social Work Consult for Recovery Care Planning/Counseling   Complete  Palliative Care Screening   Not Applicable  Medication Review [name]) Complete Complete Complete  PCP or Specialist appointment within 3-5 days of  discharge Complete Complete   HRI or Home Care Consult Complete Complete   SW Recovery Care/Counseling Consult Not Complete    Palliative Care Screening Not Applicable    Skilled Nursing Facility Not Applicable

## 2024-03-26 NOTE — Progress Notes (Signed)
 Initial Nutrition Assessment  DOCUMENTATION CODES:  Not applicable  INTERVENTION:  Multivitamin w/ minerals daily Meal ordering with assist Ensure Plus High Protein po BID, each supplement provides 350 kcal and 20 grams of protein Encourage good PO intake   NUTRITION DIAGNOSIS:  Increased nutrient needs related to chronic illness as evidenced by estimated needs.  GOAL:  Patient will meet greater than or equal to 90% of their needs  MONITOR:  PO intake, Supplement acceptance, Weight trends, Labs  REASON FOR ASSESSMENT:  Consult Assessment of nutrition requirement/status  ASSESSMENT:  72 y.o. female presented from [facility] for altered mental status. Pt recently admitted 7/18-7/22 to [facility] with intractable N/V. PMH includes breast cancer w/ mets to the liver and lung, CHF, HLD and, HTN. Pt admitted with acute encephalopathy.   7/26 - Admitted  RD working remotely at time of assessment. RD unable to reach pt via phone in room. RD reviewed meal ordering system, it appears pt has been only receiving one meal tray per day. RD to adjust order and have pt with meal ordering assist to help ensure pt receives a tray for each meal time. Will Also order oral nutrition supplements to help with PO intake given multiple missed meals.   Meal Intake 7/28: 0% x 3 meals   Per EMR, pt with weight fluctuation over the past year.   Nutrition Related Medications: Doxycycline , Potassium Chloride , IV invanz  Labs: Magnesium  1.6    NUTRITION - FOCUSED PHYSICAL EXAM: Deferred to follow-up.  Diet Order:   Diet Order             Diet regular Room service appropriate? Yes with Assist; Fluid consistency: Thin  Diet effective now                  EDUCATION NEEDS:  Not appropriate for education at this time  Skin:  Skin Assessment: Reviewed RN Assessment (Stage 2: R Sacrum)  Last BM:  7/31 via FMS  Height:  Ht Readings from Last 1 Encounters:  03/21/24 5' 3 (1.6 m)   Weight:   Wt Readings from Last 1 Encounters:  03/26/24 106.6 kg   Ideal Body Weight:  52.3 kg  BMI:  Body mass index is 41.63 kg/m.  Estimated Nutritional Needs:  Kcal:  1900-2100 Protein:  95-115 grams Fluid:  >/= 1.9 L   Nestora Glatter RD, LDN Clinical Dietitian

## 2024-03-27 ENCOUNTER — Telehealth

## 2024-03-27 DIAGNOSIS — Z66 Do not resuscitate: Secondary | ICD-10-CM | POA: Diagnosis not present

## 2024-03-27 DIAGNOSIS — G9340 Encephalopathy, unspecified: Secondary | ICD-10-CM | POA: Diagnosis not present

## 2024-03-27 DIAGNOSIS — Z515 Encounter for palliative care: Secondary | ICD-10-CM | POA: Diagnosis not present

## 2024-03-27 DIAGNOSIS — Z7189 Other specified counseling: Secondary | ICD-10-CM | POA: Diagnosis not present

## 2024-03-27 NOTE — Plan of Care (Signed)

## 2024-03-27 NOTE — Progress Notes (Addendum)
 Daily Progress Note   Date: 03/27/2024   Patient Name: [name]  DOB: 1951/06/07  MRN: [id number]  Age / Sex: 73 y.o., female  Attending Physician: Trixie Nilda HERO, MD Primary Care Physician: Jefferey Fitch, MD Admit Date: 03/21/2024 Length of Stay: 6 days  Reason for Follow-up: Establishing goals of care  Past Medical History:  Diagnosis Date   Anemia    pt pt report   Anxiety    Bacteremia due to group B Streptococcus    June '25   Breast cancer (HCC)    mets to liver and lung   Breast cancer metastasized to multiple sites [facility]) 02/26/2013   Cellulitis    CHF (congestive heart failure) (HCC)    History of chemotherapy 09/2004   taxotere/herceptin /carboplatin   Hypertension    Neuropathy    Osteomyelitis of sacrum (HCC)    Radiation 07/31/2006   left upper chest   Radiation 06/17/2006-06/27/2006   6480 cGy bilat. chest wall   Septic arthritis (HCC)    left SI joint   SVC syndrome    Thrombosis     Subjective:   Subjective: Chart Reviewed. Updates received. Patient Assessed. Created space and opportunity for patient  and family to explore thoughts and feelings regarding current medical situation.  Today's Discussion: Today before meeting with the patient/family, I reviewed the chart including TOC note from yesterday, OT note from yesterday.  Later today I read hospice note and TOC notes from today.  I also reviewed nursing flowsheets, vitals, medication administration record.  Today saw the patient at bedside, no family present.  She was awake and lying in the bed.  She appeared comfortable.  She denies pain, nausea, vomiting.  She states that they are not planning to get her out of bed today because of bedsores.  We reviewed the plan for continued full code and full scope.  Goal is to discharge to SNF/rehab when a facility is identified.  I shared that social work is trying to locate a facility in [place] where she and her family live, preference is for [facility]  and aspirin.  At this time the patient continues to be stable for discharge, awaiting facility for placement.  Goals are clear.  No ongoing palliative needs at this time.  Because of that we will back off and be available for any significant changes or new needs.  Patient verbalized understanding.  I provided emotional and general support through therapeutic listening, empathy, sharing of stories, and other techniques. I answered all questions and addressed all concerns to the best of my ability.  Review of Systems  Constitutional:  Positive for appetite change.       Denies pain in general  Respiratory:  Negative for shortness of breath.   Gastrointestinal:  Negative for abdominal pain, nausea and vomiting.    Objective:   Primary Diagnoses: Present on Admission:  Acute metabolic encephalopathy  Lactic acidosis  Hypomagnesemia  AKI (acute kidney injury) (HCC)  Bacteremia due to group B Streptococcus  Essential hypertension  HLD (hyperlipidemia)  Chronic diastolic CHF (congestive heart failure) (HCC)  History of pulmonary embolism  GAD (generalized anxiety disorder)  Anemia of chronic disease  Hypokalemia  Obesity, Class III, BMI 40-49.9 (morbid obesity)  Septic arthritis of sacroiliac joint (HCC)  Breast cancer metastasized to multiple sites (HCC)  Rash   Vital Signs:  BP 120/67 (BP Location: Left Arm)   Pulse 91   Temp 98.5 F (36.9 C) (Axillary)   Resp 16  Ht 5' 3 (1.6 m)   Wt 107.5 kg   SpO2 99%   BMI 41.98 kg/m   Physical Exam Vitals and nursing note reviewed.  Constitutional:      General: She is not in acute distress.    Appearance: She is obese. She is ill-appearing.  Cardiovascular:     Rate and Rhythm: Normal rate.  Pulmonary:     Effort: Pulmonary effort is normal. No respiratory distress.     Breath sounds: No wheezing or rhonchi.  Abdominal:     General: Abdomen is flat. Bowel sounds are normal. There is no distension.     Palpations: Abdomen  is soft.     Tenderness: There is no abdominal tenderness.  Skin:    General: Skin is warm and dry.  Neurological:     General: No focal deficit present.     Mental Status: She is alert and oriented to person, place, and time.  Psychiatric:        Mood and Affect: Mood normal.        Behavior: Behavior normal.     Palliative Assessment/Data: 50-60%   Existing Vynca/ACP Documentation: None  Assessment & Plan:   HPI/Patient Profile:  73 y.o. female  with past medical history of breast cancer now in remission, recently admitted for septic arthritis of SI joint and sacral osteomyelitis, diastolic CHF, history of PE on Eliquis .  She presented with an unresponsive episode and was admitted on 03/21/2024 with acute metabolic encephalopathy, septic arthritis and sacroiliac joint, lactic acidosis, narrow complex tachycardia, generalized weakness, failure to thrive, AKI and others.    Palliative medicine was consulted for GOC conversations  SUMMARY OF RECOMMENDATIONS   Full code Full scope of care Awaiting SNF/rehab facility Anticipate discharge to SNF/rehab in the next 24 to 48 hours Goals are clear at this time Palliative medicine will back off at this time Please notify us  of any significant clinical change or new palliative needs  Symptom Management:  Per primary team PT available to assist as needed  Code Status: Full code  Prognosis: Unable to determine  Discharge Planning: SNF/rehab  Discussed with: Patient, medical team, nursing team  Thank you for allowing us  to participate in the care of [name] PMT will continue to support holistically.  Billing based on MDM: Moderate  Detailed review of medical records (labs, imaging, vital signs), medically appropriate exam, discussed with treatment team, counseling and education to patient, family, & staff, documenting clinical information, medication management, coordination of care  Camellia Kays, NP Palliative Medicine  Team  Team Phone # [phone] (Nights/Weekends)  04/25/2021, 8:17 AM

## 2024-03-27 NOTE — Progress Notes (Signed)
 PROGRESS NOTE  [name] FMW:[id number] DOB: 07-May-1951 DOA: 03/21/2024 PCP: Jefferey Fitch, MD   LOS: 6 days   Brief Narrative / Interim history: 73 year old female with breast cancer now in remission, recently admitted for septic arthritis of SI joint and sacral osteomyelitis, diastolic CHF, history of PE on Eliquis  here with an unresponsive episode.  She has had few hospitalizations this year in May, June, July, diagnosed with SI septic arthritis currently on 6 weeks of antibiotics.  She was readmitted 7/18-22 for vomiting, CT showed thickening of the esophagus, she improved with conservative management.  Of note, she had an EGD in May 2025 for vomiting/hematemesis, and it showed congestive gastropathy only.  Seen by ID as an outpatient on 7/25, due to rash Rocephin  was changed to ertapenem .  On the day of admission patient was found to be slumped on the toilet, groggy, and out of responsiveness.  She was brought to the ER and admitted to the hospital.  Was found to have elevated lactic acid  Subjective / 24h Interval events: No complaints. Wants to sit up in the chair  Assesement and Plan: Principal Problem:   Acute metabolic encephalopathy Active Problems:   Septic arthritis of sacroiliac joint (HCC)   Rash   Narrow complex tachycardia (HCC)   Essential hypertension   Chronic diastolic CHF (congestive heart failure) (HCC)   Breast cancer metastasized to multiple sites (HCC)   Anemia of chronic disease   Obesity, Class III, BMI 40-49.9 (morbid obesity)   History of pulmonary embolism   Hypokalemia   Hypomagnesemia   Bacteremia due to group B Streptococcus   HLD (hyperlipidemia)   Lactic acidosis   AKI (acute kidney injury) (HCC)   GAD (generalized anxiety disorder)   Hyponatremia  Principal problem Unresponsive episode, lactic acidosis -patient was found to have tachyarrhythmia, see below, which may have contributed to her unresponsive episode.  Lactate was very  elevated on admission, unclear reason, not septic.  Lactate improved -ready to dc to SNF when bed available  Active problems Narrow complex tachycardia -Coreg  was hold on admission due to hypotension.  She developed narrow complex tachycardia, resolved with diltiazem .  Cardiology suspected atrial tachycardia, possibly junctional tachycardia.  Her regimen was changed to metoprolol  and amiodarone , no further episodes on telemetry.  Due to intermittent hypotension, metoprolol  is on hold and continue amiodarone  alone - BP is stable  Acute metabolic encephalopathy, generalized weakness, FTT -she seems to have had a broader decline in function.  She has been having poor p.o. intake in the last 4 months as well.  Palliative consulted, appreciate input.  PT/OT evaluation recommends SNF, she is stable for discharge to SNF, placement pending  Septic arthritis of sacroiliac joint, group B strep bacteremia -currently on ertapenem  and doxycycline .  End date 03/30/2024  Rash, eosinophilia - This developed in the last few weeks but patient didn't note it until after the last admission.  ID suspect it is Rocephin  allergy.  DRESS ruled out.      Hyponatremia - Mild, asymptomatic   GAD - Hold Xanax , duloxetine    AKI (acute kidney injury) (HCC) - Creatinine 1.3 on admission up from baseline 0.8. Improved to baseline with holding diuretics, losartan    HLD - Hold Crestor  for now   Hypomagnesemia -magnesium  again today   Hypokalemia - Keep K>4.  Potassium stable, but will supplement   History of pulmonary embolism - Continue home Eliquis    Obesity, Class III - BMI 41.8, complicates care   Anemia of chronic  disease - Hgb stable, no clinical bleeding   Scheduled Meds:  amiodarone   200 mg Oral BID   apixaban   5 mg Oral BID   Chlorhexidine  Gluconate Cloth  6 each Topical Daily   doxycycline   100 mg Oral Q12H   feeding supplement  237 mL Oral BID BM   multivitamin with minerals  1 tablet Oral Daily    sodium chloride  flush  10-40 mL Intracatheter Q12H   Continuous Infusions:  ertapenem  1 g (03/27/24 0941)   PRN Meds:.acetaminophen  **OR** acetaminophen , diphenhydrAMINE , diphenhydrAMINE -zinc  acetate, melatonin, menthol -cetylpyridinium, ondansetron  (ZOFRAN ) IV, oxyCODONE   Current Outpatient Medications  Medication Instructions   acetaminophen  (TYLENOL ) 500-1,000 mg, Every 6 hours PRN   albuterol  (VENTOLIN  HFA) 108 (90 Base) MCG/ACT inhaler 2 puffs, Inhalation, Every 6 hours PRN   ALPRAZolam  (XANAX ) 1 mg, Oral, 3 times daily PRN   amLODipine  (NORVASC ) 10 mg, Oral, Every morning   apixaban  (ELIQUIS ) 5 mg, Oral, 2 times daily   CALCIUM  PO 2 tablets, Daily   carvedilol  (COREG ) 12.5 mg, Oral, 2 times daily with meals   cefTRIAXone  (ROCEPHIN ) IVPB 2 g, Intravenous, Every 24 hours, Indication:  L-iliac fossa abscess First Dose: Yes Last Day of Therapy:  03/30/24 Labs - Once weekly:  CBC/D and BMP, Labs - Once weekly- ESR and CRP Method of administration: IV Push Method of administration may be changed at the discretion of home infusion pharmacist based upon assessment of the patient and/or caregiver's ability to self-administer the medication ordered.   cyclobenzaprine  (FLEXERIL ) 5 mg, Oral, 2 times daily PRN   daptomycin  (CUBICIN ) IVPB 700 mg, Intravenous, Every 24 hours, Indication:  L-iliac fossa abscess First Dose: Yes Last Day of Therapy:  03/30/24 Labs - Once weekly:  CBC/D, BMP, and CPK Labs - Once weekly- ESR and CRP Method of administration: IV Push Method of administration may be changed at the discretion of home infusion pharmacist based upon assessment of the patient and/or caregiver's ability to self-administer the medication ordered.   doxycycline  (VIBRA -TABS) 100 mg, Oral, Every 12 hours   DULoxetine  (CYMBALTA ) 60 mg, Daily   ertapenem  (INVANZ ) IVPB 1 g, Intravenous, Every 24 hours, Indication:  L-iliac fossa abscess  First Dose: Yes Last Day of Therapy:  03/30/24 Labs - Once  weekly:  CBC/D and BMP, Labs - Once weekly: ESR and CRP Method of administration: Mini-Bag Plus / Gravity Method of administration may be changed at the discretion of home infusion pharmacist based upon assessment of the patient and/or caregiver's ability to self-administer the medication ordered.   ertapenem  (INVANZ ) 1 g, Intramuscular, Every 24 hours   furosemide  (LASIX ) 40 mg, As needed   gabapentin  (NEURONTIN ) 300 MG capsule TAKE 2 CAPSULES BY MOUTH THREE TIMES DAILY   [Paused] losartan  (COZAAR ) 100 mg, Every morning   ondansetron  (ZOFRAN ) 8 mg, Oral, Every 8 hours PRN   Oxycodone  HCl 10 mg, Every 6 hours PRN   pantoprazole  (PROTONIX ) 40 mg, Oral, Daily   predniSONE (DELTASONE) 5 mg, Daily PRN   rosuvastatin  (CRESTOR ) 10 mg, Oral, Daily   [Paused] spironolactone  (ALDACTONE ) 25 MG tablet Take 1 tablet daily   temazepam  (RESTORIL ) 30 mg, Oral, Daily at bedtime    Diet Orders (From admission, onward)     Start     Ordered   03/26/24 0926  Diet regular Room service appropriate? Yes with Assist; Fluid consistency: Thin  Diet effective now       Question Answer Comment  Room service appropriate? Yes with Assist   Fluid consistency: Thin  03/26/24 0926            DVT prophylaxis: SCDs Start: 03/21/24 2318 apixaban  (ELIQUIS ) tablet 5 mg   Lab Results  Component Value Date   PLT 141 (L) 03/26/2024      Code Status: Full Code  Family Communication: no family at bedside   Status is: Inpatient Remains inpatient appropriate because: severity of illness  Level of care: Telemetry Medical  Consultants:  ID Cardiology   Objective: Vitals:   03/27/24 0351 03/27/24 0500 03/27/24 0733 03/27/24 1158  BP: (!) 117/58  120/67 109/78  Pulse: 91   88  Resp: 16   19  Temp: 98.1 F (36.7 C)  98.5 F (36.9 C) 98.3 F (36.8 C)  TempSrc: Oral  Axillary Oral  SpO2: 99%   100%  Weight:  107.5 kg    Height:        Intake/Output Summary (Last 24 hours) at 03/27/2024  1337 Last data filed at 03/27/2024 0600 Gross per 24 hour  Intake 150 ml  Output --  Net 150 ml   Wt Readings from Last 3 Encounters:  03/27/24 107.5 kg  03/12/24 117.5 kg  02/13/24 102.1 kg    Examination:  Constitutional: NAD  Data Reviewed: I have independently reviewed following labs and imaging studies   CBC Recent Labs  Lab 03/22/24 0012 03/22/24 0500 03/23/24 0740 03/24/24 0543 03/25/24 0225 03/26/24 0435  WBC 11.8* 11.9* 15.4* 14.6* 13.7* 10.4  HGB 11.1* 9.9* 10.8* 10.4* 9.7* 8.7*  HCT 30.4* 27.0* 29.7* 28.2* 26.7* 24.0*  PLT 183 152 153 122* 133* 141*  MCV 75.1* 74.0* 74.4* 74.8* 75.2* 75.0*  MCH 27.4 27.1 27.1 27.6 27.3 27.2  MCHC 36.5* 36.7* 36.4* 36.9* 36.3* 36.3*  RDW 16.7* 16.6* 16.7* 16.9* 17.1* 16.9*  LYMPHSABS 2.3 0.5*  --  3.2  --   --   MONOABS 0.9 0.5  --  1.3*  --   --   EOSABS 4.0* 5.0*  --  4.0*  --   --   BASOSABS 0.1 0.1  --  0.1  --   --     Recent Labs  Lab 03/22/24 0012 03/22/24 0432 03/22/24 0500 03/22/24 0530 03/22/24 0923 03/23/24 0740 03/23/24 1436 03/24/24 0543 03/25/24 0225 03/26/24 0435  NA 132*  --  133*  --   --  131*  --  131* 131* 135  K 3.7  --  3.3*  --   --  4.1  --  3.9 3.9 3.5  CL 104  --  106  --   --  103  --  105 106 107  CO2 18*  --  18*  --   --  18*  --  17* 19* 20*  GLUCOSE 88  --  90  --   --  96  --  92 87 88  BUN 22  --  20  --   --  19  --  20 20 12   CREATININE 1.36*  --  1.19*  --   --  1.08*  --  0.97 0.93 0.64  CALCIUM  7.2*  --  7.1*  --   --  7.7*  --  7.3* 7.6* 7.9*  AST  --   --  21  --   --  18  --  16 15 17   ALT  --   --  18  --   --  17  --  16 16 15   ALKPHOS  --   --  83  --   --  84  --  85 73 66  BILITOT  --   --  0.9  --   --  0.7  --  1.0 1.1 1.2  ALBUMIN   --   --  1.5*  --   --  1.5*  --  <1.5* 1.5* 1.9*  MG 1.6*  --  1.9  --   --   --   --   --   --  1.6*  PROCALCITON  --   --  3.50  --   --   --   --   --   --   --   LATICACIDVEN 2.4* 3.2*  --   --   --   --  1.8  --   --   --    INR  --   --   --   --  1.8*  --   --   --   --   --   TSH  --   --   --  3.025  --   --   --   --   --   --   AMMONIA  --   --   --  19  --   --   --   --   --   --   BNP  --   --   --  31.2  --   --   --   --   --   --     ------------------------------------------------------------------------------------------------------------------ No results for input(s): CHOL, HDL, LDLCALC, TRIG, CHOLHDL, LDLDIRECT in the last 72 hours.  Lab Results  Component Value Date   HGBA1C 5.4 11/29/2023   ------------------------------------------------------------------------------------------------------------------ No results for input(s): TSH, T4TOTAL, T3FREE, THYROIDAB in the last 72 hours.  Invalid input(s): FREET3  Cardiac Enzymes No results for input(s): CKMB, TROPONINI, MYOGLOBIN in the last 168 hours.  Invalid input(s): CK ------------------------------------------------------------------------------------------------------------------    Component Value Date/Time   BNP 31.2 03/22/2024 0530    CBG: No results for input(s): GLUCAP in the last 168 hours.  No results found for this or any previous visit (from the past 240 hours).   Radiology Studies: No results found.   Nilda Fendt, MD, PhD [facility]  Between 7 am - 7 pm I am available, please contact me via Amion (for emergencies) or Securechat (non urgent messages)  Between 7 pm - 7 am I am not available, please contact night coverage MD/APP via Amion

## 2024-03-27 NOTE — Progress Notes (Signed)
 Physical Therapy Treatment Patient Details Name: [name] MRN: [id number] DOB: 1951-06-26 Today's Date: 03/27/2024   History of Present Illness 73 y.o. female with metabolic encephalopathy,  lactic acidosis and AKI. Developed  tachycardia. Recently admitted for septic arthritis of the SI joint, then readmitted with vomiting, weakness. PMH: SI osteomyelitis and abscess. CT guided aspiration of L iliacus on 6/20. PMH includes metastatic breast CA to liver and bones in remission, PE, CHF, HTN, anxiety.   PT Comments  Pt in bed upon arrival and agreeable to PT session. Pt improved in today's session with improvements in initiation and less assistance needed to roll, MaxA. Attempted x2 stands with Stedy and MaxAx2 with use of bed pad. Pt able to clear bottom, however, unable to maintain upright position to place Stedy flaps. Of note, pt needed assist to reach for stedy handle and to flex B knees due to stiffness. Returned to supine with assist needed for pericare. Continue to recommend <3hrs post acute rehab. Acute PT to follow.    If plan is discharge home, recommend the following: Two people to help with walking and/or transfers;Two people to help with bathing/dressing/bathroom   Can travel by private vehicle     No  Equipment Recommendations  Hospital bed;Hoyer lift       Precautions / Restrictions Precautions Precautions: Fall;Other (comment) (porta cath) Recall of Precautions/Restrictions: Impaired Precaution/Restrictions Comments: rectal pouch Restrictions Weight Bearing Restrictions Per Provider Order: No     Mobility  Bed Mobility Overal bed mobility: Needs Assistance Bed Mobility: Rolling, Sidelying to Sit, Sit to Supine Rolling: Max assist, Used rails Sidelying to sit: Max assist, +2 for physical assistance, +2 for safety/equipment Supine to sit: Max assist, +2 for physical assistance, +2 for safety/equipment    General bed mobility comments: MaxA to roll with assist to  bring hand to rail, pt able to pull and maintain self in sidelying. MaxAx2 for remainder of bed mobility    Transfers Overall transfer level: Needs assistance Equipment used: Ambulation equipment used Transfers: Sit to/from Stand Sit to Stand: Via lift equipment, Max assist, +2 physical assistance, +2 safety/equipment    General transfer comment: MaxAx2 to boost-up with use of bed pad. Assist to bring hands to stedy handle with pt able to grip. Able to clear bottom, however, unable to maintain upright position to place stedy flaps. Return to bed as fecal management system became dislodged Transfer via Lift Equipment: Stedy     Balance Overall balance assessment: Needs assistance, History of Falls Sitting-balance support: No upper extremity supported, Feet supported Sitting balance-Leahy Scale: Fair   Postural control: Posterior lean Standing balance support: During functional activity, Bilateral upper extremity supported, Reliant on assistive device for balance Standing balance-Leahy Scale: Zero     Communication Communication Communication: No apparent difficulties  Cognition Arousal: Alert Behavior During Therapy: Flat affect   PT - Cognitive impairments: Awareness, Memory, Attention, Initiation, Sequencing, Problem solving, Safety/Judgement    PT - Cognition Comments: slow processing with difficulty remembering events that happened while in the hospital Following commands: Impaired Following commands impaired: Follows one step commands with increased time    Cueing Cueing Techniques: Verbal cues, Gestural cues, Tactile cues, Visual cues         Pertinent Vitals/Pain Pain Assessment Pain Assessment: Faces Faces Pain Scale: Hurts little more Pain Location: generalized Pain Descriptors / Indicators: Grimacing, Discomfort, Tender Pain Intervention(s): Monitored during session, Limited activity within patient's tolerance, Repositioned     PT Goals (current goals can now  be  found in the care plan section) Acute Rehab PT Goals PT Goal Formulation: With patient/family Time For Goal Achievement: 04/07/24 Potential to Achieve Goals: Fair Progress towards PT goals: Progressing toward goals    Frequency    Min 2X/week       AM-PAC PT 6 Clicks Mobility   Outcome Measure  Help needed turning from your back to your side while in a flat bed without using bedrails?: A Lot Help needed moving from lying on your back to sitting on the side of a flat bed without using bedrails?: Total Help needed moving to and from a bed to a chair (including a wheelchair)?: Total Help needed standing up from a chair using your arms (e.g., wheelchair or bedside chair)?: Total Help needed to walk in hospital room?: Total Help needed climbing 3-5 steps with a railing? : Total 6 Click Score: 7    End of Session   Activity Tolerance: Other (comment) (limited by fecal management system becoming dislodged) Patient left: in bed;with call bell/phone within reach;with bed alarm set;with family/visitor present Nurse Communication: Mobility status;Other (comment) (in room at end of session) PT Visit Diagnosis: Muscle weakness (generalized) (M62.81);History of falling (Z91.81)     Time: 1132-1200 PT Time Calculation (min) (ACUTE ONLY): 28 min  Charges:    $Therapeutic Exercise: 8-22 mins $Therapeutic Activity: 8-22 mins PT General Charges $$ ACUTE PT VISIT: 1 Visit                     Kate ORN, PT, DPT Secure Chat Preferred  Rehab Office [phone]    Kate BRAVO Wendolyn 03/27/2024, 12:19 PM

## 2024-03-27 NOTE — TOC Progression Note (Signed)
 Transition of Care [facility]) - Progression Note    Patient Details  Name: [name] MRN: [id number] Date of Birth: October 07, 1950  Transition of Care [facility]) CM/SW Contact  Sherline Clack, [place] Phone Number: 03/27/2024, 3:31 PM  Clinical Narrative:     CSW met with pt's family at bedside alongside CSW [place]. CSW informed family of open bed at [facility] for Sunday. Family and pt requested placement. Family also agreed to transportation through [facility] on Sunday.   Expected Discharge Plan: Skilled Nursing Facility Barriers to Discharge: Continued Medical Work up               Expected Discharge Plan and Services In-house Referral: Clinical Social Work Discharge Planning Services: CM Consult Post Acute Care Choice: Skilled Nursing Facility Living arrangements for the past 2 months: Single Family Home                                       Social Drivers of Health (SDOH) Interventions SDOH Screenings   Food Insecurity: No Food Insecurity (03/22/2024)  Housing: Low Risk  (03/22/2024)  Transportation Needs: No Transportation Needs (03/22/2024)  Utilities: Not At Risk (03/22/2024)  Depression (PHQ2-9): Low Risk  (03/19/2024)  Social Connections: Moderately Integrated (03/22/2024)  Recent Concern: Social Connections - Moderately Isolated (02/14/2024)  Tobacco Use: Low Risk  (03/21/2024)    Readmission Risk Interventions    03/16/2024    4:38 PM 02/14/2024   12:50 PM 01/21/2024    1:47 PM  Readmission Risk Prevention Plan  Transportation Screening Complete Complete Complete  PCP or Specialist Appt within 3-5 Days   Complete  HRI or Home Care Consult   Complete  Social Work Consult for Recovery Care Planning/Counseling   Complete  Palliative Care Screening   Not Applicable  Medication Review [name]) Complete Complete Complete  PCP or Specialist appointment within 3-5 days of discharge Complete Complete   HRI or  Home Care Consult Complete Complete   SW Recovery Care/Counseling Consult Not Complete    Palliative Care Screening Not Applicable    Skilled Nursing Facility Not Applicable

## 2024-03-28 ENCOUNTER — Other Ambulatory Visit (HOSPITAL_COMMUNITY): Payer: Self-pay

## 2024-03-28 DIAGNOSIS — Z7189 Other specified counseling: Secondary | ICD-10-CM | POA: Diagnosis not present

## 2024-03-28 DIAGNOSIS — C50919 Malignant neoplasm of unspecified site of unspecified female breast: Secondary | ICD-10-CM

## 2024-03-28 DIAGNOSIS — G9340 Encephalopathy, unspecified: Secondary | ICD-10-CM | POA: Diagnosis not present

## 2024-03-28 DIAGNOSIS — Z789 Other specified health status: Secondary | ICD-10-CM

## 2024-03-28 DIAGNOSIS — Z66 Do not resuscitate: Secondary | ICD-10-CM

## 2024-03-28 DIAGNOSIS — M4658 Other infective spondylopathies, sacral and sacrococcygeal region: Secondary | ICD-10-CM

## 2024-03-28 DIAGNOSIS — Z515 Encounter for palliative care: Secondary | ICD-10-CM

## 2024-03-28 NOTE — Plan of Care (Signed)

## 2024-03-28 NOTE — Progress Notes (Signed)
 PROGRESS NOTE  [name] FMW:[id number] DOB: 03-Jan-1951 DOA: 03/21/2024 PCP: Jefferey Fitch, MD   LOS: 7 days   Brief Narrative / Interim history: 73 year old female with breast cancer now in remission, recently admitted for septic arthritis of SI joint and sacral osteomyelitis, diastolic CHF, history of PE on Eliquis  here with an unresponsive episode.  She has had few hospitalizations this year in May, June, July, diagnosed with SI septic arthritis currently on 6 weeks of antibiotics.  She was readmitted 7/18-22 for vomiting, CT showed thickening of the esophagus, she improved with conservative management.  Of note, she had an EGD in May 2025 for vomiting/hematemesis, and it showed congestive gastropathy only.  Seen by ID as an outpatient on 7/25, due to rash Rocephin  was changed to ertapenem .  On the day of admission patient was found to be slumped on the toilet, groggy, and out of responsiveness.  She was brought to the ER and admitted to the hospital.  Was found to have elevated lactic acid  Subjective / 24h Interval events: BP this morning, no complaints  Assesement and Plan: Principal Problem:   Acute metabolic encephalopathy Active Problems:   Septic arthritis of sacroiliac joint (HCC)   Rash   Narrow complex tachycardia (HCC)   Essential hypertension   Chronic diastolic CHF (congestive heart failure) (HCC)   Breast cancer metastasized to multiple sites (HCC)   Anemia of chronic disease   Obesity, Class III, BMI 40-49.9 (morbid obesity)   History of pulmonary embolism   Hypokalemia   Hypomagnesemia   Bacteremia due to group B Streptococcus   HLD (hyperlipidemia)   Lactic acidosis   AKI (acute kidney injury) (HCC)   GAD (generalized anxiety disorder)   Hyponatremia  Principal problem Unresponsive episode, lactic acidosis -patient was found to have tachyarrhythmia, see below, which may have contributed to her unresponsive episode.  Lactate was very elevated on  admission, unclear reason, not septic.  Lactate improved -ready to dc to SNF when bed available, likely Sunday  Active problems Narrow complex tachycardia -Coreg  was hold on admission due to hypotension.  She developed narrow complex tachycardia, resolved with diltiazem .  Cardiology suspected atrial tachycardia, possibly junctional tachycardia.  Her regimen was changed to metoprolol  and amiodarone , no further episodes on telemetry.  Due to intermittent hypotension, metoprolol  is on hold and continue amiodarone  alone - Blood pressure is stable this morning  Acute metabolic encephalopathy, generalized weakness, FTT -she seems to have had a broader decline in function.  She has been having poor p.o. intake in the last 4 months as well.  Palliative consulted, appreciate input.  PT/OT evaluation recommends SNF, she is stable for discharge to SNF, placement pending.  Bed available on Sunday  Septic arthritis of sacroiliac joint, group B strep bacteremia -currently on ertapenem  and doxycycline .  End date 03/30/2024  Rash, eosinophilia - This developed in the last few weeks but patient didn't note it until after the last admission.  ID suspect it is Rocephin  allergy.  DRESS ruled out.      Hyponatremia - Mild, asymptomatic   GAD - Hold Xanax , duloxetine    AKI (acute kidney injury) (HCC) - Creatinine 1.3 on admission up from baseline 0.8. Improved to baseline with holding diuretics, losartan    HLD - Hold Crestor  for now   Hypomagnesemia -check labs tomorrow   Hypokalemia - Keep K>4.  Potassium stable, but will supplement.  Check labs tomorrow   History of pulmonary embolism - Continue home Eliquis    Obesity, Class III -  BMI 41.8, complicates care   Anemia of chronic disease - Hgb stable, no clinical bleeding   Scheduled Meds:  amiodarone   200 mg Oral BID   apixaban   5 mg Oral BID   Chlorhexidine  Gluconate Cloth  6 each Topical Daily   doxycycline   100 mg Oral Q12H   feeding supplement  237  mL Oral BID BM   multivitamin with minerals  1 tablet Oral Daily   sodium chloride  flush  10-40 mL Intracatheter Q12H   Continuous Infusions:  ertapenem  1 g (03/27/24 0941)   PRN Meds:.acetaminophen  **OR** acetaminophen , diphenhydrAMINE , diphenhydrAMINE -zinc  acetate, melatonin, menthol -cetylpyridinium, ondansetron  (ZOFRAN ) IV, oxyCODONE   Current Outpatient Medications  Medication Instructions   acetaminophen  (TYLENOL ) 500-1,000 mg, Every 6 hours PRN   albuterol  (VENTOLIN  HFA) 108 (90 Base) MCG/ACT inhaler 2 puffs, Inhalation, Every 6 hours PRN   ALPRAZolam  (XANAX ) 1 mg, Oral, 3 times daily PRN   amLODipine  (NORVASC ) 10 mg, Oral, Every morning   apixaban  (ELIQUIS ) 5 mg, Oral, 2 times daily   CALCIUM  PO 2 tablets, Daily   carvedilol  (COREG ) 12.5 mg, Oral, 2 times daily with meals   cefTRIAXone  (ROCEPHIN ) IVPB 2 g, Intravenous, Every 24 hours, Indication:  L-iliac fossa abscess First Dose: Yes Last Day of Therapy:  03/30/24 Labs - Once weekly:  CBC/D and BMP, Labs - Once weekly- ESR and CRP Method of administration: IV Push Method of administration may be changed at the discretion of home infusion pharmacist based upon assessment of the patient and/or caregiver's ability to self-administer the medication ordered.   cyclobenzaprine  (FLEXERIL ) 5 mg, Oral, 2 times daily PRN   daptomycin  (CUBICIN ) IVPB 700 mg, Intravenous, Every 24 hours, Indication:  L-iliac fossa abscess First Dose: Yes Last Day of Therapy:  03/30/24 Labs - Once weekly:  CBC/D, BMP, and CPK Labs - Once weekly- ESR and CRP Method of administration: IV Push Method of administration may be changed at the discretion of home infusion pharmacist based upon assessment of the patient and/or caregiver's ability to self-administer the medication ordered.   doxycycline  (VIBRA -TABS) 100 mg, Oral, Every 12 hours   DULoxetine  (CYMBALTA ) 60 mg, Daily   ertapenem  (INVANZ ) IVPB 1 g, Intravenous, Every 24 hours, Indication:  L-iliac fossa  abscess  First Dose: Yes Last Day of Therapy:  03/30/24 Labs - Once weekly:  CBC/D and BMP, Labs - Once weekly: ESR and CRP Method of administration: Mini-Bag Plus / Gravity Method of administration may be changed at the discretion of home infusion pharmacist based upon assessment of the patient and/or caregiver's ability to self-administer the medication ordered.   ertapenem  (INVANZ ) 1 g, Intramuscular, Every 24 hours   furosemide  (LASIX ) 40 mg, As needed   gabapentin  (NEURONTIN ) 300 MG capsule TAKE 2 CAPSULES BY MOUTH THREE TIMES DAILY   [Paused] losartan  (COZAAR ) 100 mg, Every morning   ondansetron  (ZOFRAN ) 8 mg, Oral, Every 8 hours PRN   Oxycodone  HCl 10 mg, Every 6 hours PRN   pantoprazole  (PROTONIX ) 40 mg, Oral, Daily   predniSONE (DELTASONE) 5 mg, Daily PRN   rosuvastatin  (CRESTOR ) 10 mg, Oral, Daily   [Paused] spironolactone  (ALDACTONE ) 25 MG tablet Take 1 tablet daily   temazepam  (RESTORIL ) 30 mg, Oral, Daily at bedtime    Diet Orders (From admission, onward)     Start     Ordered   03/26/24 0926  Diet regular Room service appropriate? Yes with Assist; Fluid consistency: Thin  Diet effective now       Question Answer Comment  Room service  appropriate? Yes with Assist   Fluid consistency: Thin      03/26/24 0926            DVT prophylaxis: SCDs Start: 03/21/24 2318 apixaban  (ELIQUIS ) tablet 5 mg   Lab Results  Component Value Date   PLT 141 (L) 03/26/2024      Code Status: Limited: Do not attempt resuscitation (DNR) -DNR-LIMITED -Do Not Intubate/DNI   Family Communication: no family at bedside   Status is: Inpatient Remains inpatient appropriate because: severity of illness  Level of care: Telemetry Medical  Consultants:  ID Cardiology   Objective: Vitals:   03/27/24 2339 03/28/24 0405 03/28/24 0500 03/28/24 0725  BP: 134/68 117/74  130/62  Pulse:    85  Resp:    18  Temp: 98.9 F (37.2 C) 99 F (37.2 C)  98.7 F (37.1 C)  TempSrc: Oral Oral   Oral  SpO2:    100%  Weight:   107.5 kg   Height:        Intake/Output Summary (Last 24 hours) at 03/28/2024 0840 Last data filed at 03/28/2024 0600 Gross per 24 hour  Intake 100 ml  Output 200 ml  Net -100 ml   Wt Readings from Last 3 Encounters:  03/28/24 107.5 kg  03/12/24 117.5 kg  02/13/24 102.1 kg    Examination:  Constitutional: NAD Eyes: lids and conjunctivae normal, no scleral icterus ENMT: mmm Neck: normal, supple Respiratory: clear to auscultation bilaterally, no wheezing, no crackles. Normal respiratory effort.  Cardiovascular: Regular rate and rhythm, no murmurs / rubs / gallops. No LE edema. Abdomen: soft, no distention, no tenderness. Bowel sounds positive.   Data Reviewed: I have independently reviewed following labs and imaging studies   CBC Recent Labs  Lab 03/22/24 0012 03/22/24 0500 03/23/24 0740 03/24/24 0543 03/25/24 0225 03/26/24 0435  WBC 11.8* 11.9* 15.4* 14.6* 13.7* 10.4  HGB 11.1* 9.9* 10.8* 10.4* 9.7* 8.7*  HCT 30.4* 27.0* 29.7* 28.2* 26.7* 24.0*  PLT 183 152 153 122* 133* 141*  MCV 75.1* 74.0* 74.4* 74.8* 75.2* 75.0*  MCH 27.4 27.1 27.1 27.6 27.3 27.2  MCHC 36.5* 36.7* 36.4* 36.9* 36.3* 36.3*  RDW 16.7* 16.6* 16.7* 16.9* 17.1* 16.9*  LYMPHSABS 2.3 0.5*  --  3.2  --   --   MONOABS 0.9 0.5  --  1.3*  --   --   EOSABS 4.0* 5.0*  --  4.0*  --   --   BASOSABS 0.1 0.1  --  0.1  --   --     Recent Labs  Lab 03/22/24 0012 03/22/24 0432 03/22/24 0500 03/22/24 0530 03/22/24 0923 03/23/24 0740 03/23/24 1436 03/24/24 0543 03/25/24 0225 03/26/24 0435  NA 132*  --  133*  --   --  131*  --  131* 131* 135  K 3.7  --  3.3*  --   --  4.1  --  3.9 3.9 3.5  CL 104  --  106  --   --  103  --  105 106 107  CO2 18*  --  18*  --   --  18*  --  17* 19* 20*  GLUCOSE 88  --  90  --   --  96  --  92 87 88  BUN 22  --  20  --   --  19  --  20 20 12   CREATININE 1.36*  --  1.19*  --   --  1.08*  --  0.97 0.93  0.64  CALCIUM  7.2*  --  7.1*  --   --   7.7*  --  7.3* 7.6* 7.9*  AST  --   --  21  --   --  18  --  16 15 17   ALT  --   --  18  --   --  17  --  16 16 15   ALKPHOS  --   --  83  --   --  84  --  85 73 66  BILITOT  --   --  0.9  --   --  0.7  --  1.0 1.1 1.2  ALBUMIN   --   --  1.5*  --   --  1.5*  --  <1.5* 1.5* 1.9*  MG 1.6*  --  1.9  --   --   --   --   --   --  1.6*  PROCALCITON  --   --  3.50  --   --   --   --   --   --   --   LATICACIDVEN 2.4* 3.2*  --   --   --   --  1.8  --   --   --   INR  --   --   --   --  1.8*  --   --   --   --   --   TSH  --   --   --  3.025  --   --   --   --   --   --   AMMONIA  --   --   --  19  --   --   --   --   --   --   BNP  --   --   --  31.2  --   --   --   --   --   --     ------------------------------------------------------------------------------------------------------------------ No results for input(s): CHOL, HDL, LDLCALC, TRIG, CHOLHDL, LDLDIRECT in the last 72 hours.  Lab Results  Component Value Date   HGBA1C 5.4 11/29/2023   ------------------------------------------------------------------------------------------------------------------ No results for input(s): TSH, T4TOTAL, T3FREE, THYROIDAB in the last 72 hours.  Invalid input(s): FREET3  Cardiac Enzymes No results for input(s): CKMB, TROPONINI, MYOGLOBIN in the last 168 hours.  Invalid input(s): CK ------------------------------------------------------------------------------------------------------------------    Component Value Date/Time   BNP 31.2 03/22/2024 0530    CBG: No results for input(s): GLUCAP in the last 168 hours.  No results found for this or any previous visit (from the past 240 hours).   Radiology Studies: No results found.   Nilda Fendt, MD, PhD [facility]  Between 7 am - 7 pm I am available, please contact me via Amion (for emergencies) or Securechat (non urgent messages)  Between 7 pm - 7 am I am not available, please contact night  coverage MD/APP via Amion

## 2024-03-29 DIAGNOSIS — Z87898 Personal history of other specified conditions: Secondary | ICD-10-CM | POA: Diagnosis not present

## 2024-03-29 DIAGNOSIS — Y92122 Bedroom in nursing home as the place of occurrence of the external cause: Secondary | ICD-10-CM | POA: Diagnosis not present

## 2024-03-29 DIAGNOSIS — S12101A Unspecified nondisplaced fracture of second cervical vertebra, initial encounter for closed fracture: Secondary | ICD-10-CM | POA: Diagnosis not present

## 2024-03-29 DIAGNOSIS — F411 Generalized anxiety disorder: Secondary | ICD-10-CM | POA: Diagnosis present

## 2024-03-29 DIAGNOSIS — C7951 Secondary malignant neoplasm of bone: Secondary | ICD-10-CM | POA: Diagnosis not present

## 2024-03-29 DIAGNOSIS — Z86711 Personal history of pulmonary embolism: Secondary | ICD-10-CM | POA: Diagnosis not present

## 2024-03-29 DIAGNOSIS — M869 Osteomyelitis, unspecified: Secondary | ICD-10-CM | POA: Diagnosis not present

## 2024-03-29 DIAGNOSIS — C50912 Malignant neoplasm of unspecified site of left female breast: Secondary | ICD-10-CM | POA: Diagnosis not present

## 2024-03-29 DIAGNOSIS — E441 Mild protein-calorie malnutrition: Secondary | ICD-10-CM | POA: Diagnosis present

## 2024-03-29 DIAGNOSIS — D649 Anemia, unspecified: Secondary | ICD-10-CM | POA: Diagnosis not present

## 2024-03-29 DIAGNOSIS — C50011 Malignant neoplasm of nipple and areola, right female breast: Secondary | ICD-10-CM | POA: Diagnosis not present

## 2024-03-29 DIAGNOSIS — W06XXXA Fall from bed, initial encounter: Secondary | ICD-10-CM | POA: Diagnosis not present

## 2024-03-29 DIAGNOSIS — W19XXXA Unspecified fall, initial encounter: Secondary | ICD-10-CM | POA: Diagnosis not present

## 2024-03-29 DIAGNOSIS — C799 Secondary malignant neoplasm of unspecified site: Secondary | ICD-10-CM | POA: Diagnosis not present

## 2024-03-29 DIAGNOSIS — G609 Hereditary and idiopathic neuropathy, unspecified: Secondary | ICD-10-CM | POA: Diagnosis present

## 2024-03-29 DIAGNOSIS — Z5112 Encounter for antineoplastic immunotherapy: Secondary | ICD-10-CM | POA: Diagnosis not present

## 2024-03-29 DIAGNOSIS — R410 Disorientation, unspecified: Secondary | ICD-10-CM | POA: Diagnosis not present

## 2024-03-29 DIAGNOSIS — M4658 Other infective spondylopathies, sacral and sacrococcygeal region: Secondary | ICD-10-CM | POA: Diagnosis not present

## 2024-03-29 DIAGNOSIS — M1711 Unilateral primary osteoarthritis, right knee: Secondary | ICD-10-CM | POA: Diagnosis not present

## 2024-03-29 DIAGNOSIS — R488 Other symbolic dysfunctions: Secondary | ICD-10-CM | POA: Diagnosis present

## 2024-03-29 DIAGNOSIS — S199XXA Unspecified injury of neck, initial encounter: Secondary | ICD-10-CM | POA: Diagnosis not present

## 2024-03-29 DIAGNOSIS — G9340 Encephalopathy, unspecified: Secondary | ICD-10-CM | POA: Diagnosis not present

## 2024-03-29 DIAGNOSIS — S12112D Nondisplaced Type II dens fracture, subsequent encounter for fracture with routine healing: Secondary | ICD-10-CM | POA: Diagnosis not present

## 2024-03-29 DIAGNOSIS — Z5181 Encounter for therapeutic drug level monitoring: Secondary | ICD-10-CM | POA: Diagnosis not present

## 2024-03-29 DIAGNOSIS — M25569 Pain in unspecified knee: Secondary | ICD-10-CM | POA: Diagnosis not present

## 2024-03-29 DIAGNOSIS — M542 Cervicalgia: Secondary | ICD-10-CM | POA: Diagnosis not present

## 2024-03-29 DIAGNOSIS — C50911 Malignant neoplasm of unspecified site of right female breast: Secondary | ICD-10-CM | POA: Diagnosis not present

## 2024-03-29 DIAGNOSIS — M7989 Other specified soft tissue disorders: Secondary | ICD-10-CM | POA: Diagnosis not present

## 2024-03-29 DIAGNOSIS — R000 Tachycardia, unspecified: Secondary | ICD-10-CM | POA: Diagnosis not present

## 2024-03-29 DIAGNOSIS — D509 Iron deficiency anemia, unspecified: Secondary | ICD-10-CM | POA: Diagnosis not present

## 2024-03-29 DIAGNOSIS — Z7409 Other reduced mobility: Secondary | ICD-10-CM | POA: Diagnosis present

## 2024-03-29 DIAGNOSIS — I871 Compression of vein: Secondary | ICD-10-CM | POA: Diagnosis not present

## 2024-03-29 DIAGNOSIS — Z7401 Bed confinement status: Secondary | ICD-10-CM | POA: Diagnosis not present

## 2024-03-29 DIAGNOSIS — Z171 Estrogen receptor negative status [ER-]: Secondary | ICD-10-CM | POA: Diagnosis not present

## 2024-03-29 DIAGNOSIS — R21 Rash and other nonspecific skin eruption: Secondary | ICD-10-CM | POA: Diagnosis not present

## 2024-03-29 DIAGNOSIS — M4628 Osteomyelitis of vertebra, sacral and sacrococcygeal region: Secondary | ICD-10-CM | POA: Diagnosis present

## 2024-03-29 DIAGNOSIS — Z993 Dependence on wheelchair: Secondary | ICD-10-CM | POA: Diagnosis not present

## 2024-03-29 DIAGNOSIS — I4719 Other supraventricular tachycardia: Secondary | ICD-10-CM | POA: Diagnosis not present

## 2024-03-29 DIAGNOSIS — M0088 Arthritis due to other bacteria, vertebrae: Secondary | ICD-10-CM | POA: Diagnosis present

## 2024-03-29 DIAGNOSIS — I5030 Unspecified diastolic (congestive) heart failure: Secondary | ICD-10-CM | POA: Diagnosis present

## 2024-03-29 DIAGNOSIS — Z853 Personal history of malignant neoplasm of breast: Secondary | ICD-10-CM | POA: Diagnosis not present

## 2024-03-29 DIAGNOSIS — C7800 Secondary malignant neoplasm of unspecified lung: Secondary | ICD-10-CM | POA: Diagnosis not present

## 2024-03-29 DIAGNOSIS — S12112A Nondisplaced Type II dens fracture, initial encounter for closed fracture: Secondary | ICD-10-CM | POA: Diagnosis not present

## 2024-03-29 DIAGNOSIS — G9341 Metabolic encephalopathy: Secondary | ICD-10-CM | POA: Diagnosis not present

## 2024-03-29 DIAGNOSIS — Z743 Need for continuous supervision: Secondary | ICD-10-CM | POA: Diagnosis not present

## 2024-03-29 DIAGNOSIS — M25532 Pain in left wrist: Secondary | ICD-10-CM | POA: Diagnosis not present

## 2024-03-29 DIAGNOSIS — S0003XA Contusion of scalp, initial encounter: Secondary | ICD-10-CM | POA: Diagnosis not present

## 2024-03-29 DIAGNOSIS — I5032 Chronic diastolic (congestive) heart failure: Secondary | ICD-10-CM | POA: Diagnosis not present

## 2024-03-29 DIAGNOSIS — E669 Obesity, unspecified: Secondary | ICD-10-CM | POA: Diagnosis not present

## 2024-03-29 DIAGNOSIS — C50812 Malignant neoplasm of overlapping sites of left female breast: Secondary | ICD-10-CM | POA: Diagnosis not present

## 2024-03-29 DIAGNOSIS — Z7901 Long term (current) use of anticoagulants: Secondary | ICD-10-CM | POA: Diagnosis not present

## 2024-03-29 DIAGNOSIS — Z9013 Acquired absence of bilateral breasts and nipples: Secondary | ICD-10-CM | POA: Diagnosis not present

## 2024-03-29 DIAGNOSIS — K219 Gastro-esophageal reflux disease without esophagitis: Secondary | ICD-10-CM | POA: Diagnosis present

## 2024-03-29 DIAGNOSIS — E785 Hyperlipidemia, unspecified: Secondary | ICD-10-CM | POA: Diagnosis present

## 2024-03-29 DIAGNOSIS — Z95828 Presence of other vascular implants and grafts: Secondary | ICD-10-CM | POA: Diagnosis not present

## 2024-03-29 DIAGNOSIS — Z6841 Body Mass Index (BMI) 40.0 and over, adult: Secondary | ICD-10-CM | POA: Diagnosis not present

## 2024-03-29 DIAGNOSIS — M25561 Pain in right knee: Secondary | ICD-10-CM | POA: Diagnosis not present

## 2024-03-29 DIAGNOSIS — M6281 Muscle weakness (generalized): Secondary | ICD-10-CM | POA: Diagnosis present

## 2024-03-29 DIAGNOSIS — I4891 Unspecified atrial fibrillation: Secondary | ICD-10-CM | POA: Diagnosis not present

## 2024-03-29 DIAGNOSIS — R2681 Unsteadiness on feet: Secondary | ICD-10-CM | POA: Diagnosis present

## 2024-03-29 DIAGNOSIS — Z741 Need for assistance with personal care: Secondary | ICD-10-CM | POA: Diagnosis present

## 2024-03-29 DIAGNOSIS — R0600 Dyspnea, unspecified: Secondary | ICD-10-CM | POA: Diagnosis not present

## 2024-03-29 DIAGNOSIS — G4700 Insomnia, unspecified: Secondary | ICD-10-CM | POA: Diagnosis not present

## 2024-03-29 DIAGNOSIS — F32A Depression, unspecified: Secondary | ICD-10-CM | POA: Diagnosis present

## 2024-03-29 DIAGNOSIS — C50012 Malignant neoplasm of nipple and areola, left female breast: Secondary | ICD-10-CM | POA: Diagnosis not present

## 2024-03-29 DIAGNOSIS — Z79899 Other long term (current) drug therapy: Secondary | ICD-10-CM | POA: Diagnosis not present

## 2024-03-29 DIAGNOSIS — C50919 Malignant neoplasm of unspecified site of unspecified female breast: Secondary | ICD-10-CM | POA: Diagnosis not present

## 2024-03-29 DIAGNOSIS — R600 Localized edema: Secondary | ICD-10-CM | POA: Diagnosis not present

## 2024-03-29 DIAGNOSIS — I110 Hypertensive heart disease with heart failure: Secondary | ICD-10-CM | POA: Diagnosis not present

## 2024-03-29 DIAGNOSIS — I10 Essential (primary) hypertension: Secondary | ICD-10-CM | POA: Diagnosis not present

## 2024-03-29 LAB — BASIC METABOLIC PANEL WITH GFR
Anion gap: 10 (ref 5–15)
BUN: 5 mg/dL — ABNORMAL LOW (ref 8–23)
CO2: 21 mmol/L — ABNORMAL LOW (ref 22–32)
Calcium: 8.2 mg/dL — ABNORMAL LOW (ref 8.9–10.3)
Chloride: 105 mmol/L (ref 98–111)
Creatinine, Ser: 0.54 mg/dL (ref 0.44–1.00)
GFR, Estimated: >60 mL/min (ref >60)
Glucose, Bld: 87 mg/dL (ref 70–99)
Potassium: 3.3 mmol/L — ABNORMAL LOW (ref 3.5–5.1)
Sodium: 136 mmol/L (ref 135–145)

## 2024-03-29 LAB — HEMOGLOBIN AND HEMATOCRIT, BLOOD
HCT: 24.2 % — ABNORMAL LOW (ref 36.0–46.0)
Hemoglobin: 8.8 g/dL — ABNORMAL LOW (ref 12.0–15.0)

## 2024-03-29 LAB — MAGNESIUM: Magnesium: 1.4 mg/dL — ABNORMAL LOW (ref 1.7–2.4)

## 2024-03-29 MED ORDER — ALPRAZOLAM 1 MG PO TABS: 1.0000 mg | ORAL_TABLET | Freq: Three times a day (TID) | ORAL | 3 refills | Status: AC | PRN

## 2024-03-29 MED ORDER — POTASSIUM CHLORIDE CRYS ER 20 MEQ PO TBCR
40.0000 meq | EXTENDED_RELEASE_TABLET | Freq: Once | ORAL | Status: AC
  Administered 2024-03-29: 40 meq via ORAL
  Filled 2024-03-29: qty 2

## 2024-03-29 MED ORDER — HEPARIN SOD (PORK) LOCK FLUSH 100 UNIT/ML IV SOLN
500.0000 [IU] | Freq: Once | INTRAVENOUS | Status: AC
  Administered 2024-03-29: 500 [IU] via INTRAVENOUS

## 2024-03-29 MED ORDER — OXYCODONE HCL 5 MG PO TABS: 5.0000 mg | ORAL_TABLET | ORAL | 0 refills | Status: AC | PRN

## 2024-03-29 MED ORDER — TEMAZEPAM 30 MG PO CAPS: 30.0000 mg | ORAL_CAPSULE | Freq: Every evening | ORAL | 0 refills | Status: AC | PRN

## 2024-03-29 MED ORDER — MAGNESIUM SULFATE 2 GM/50ML IV SOLN
2.0000 g | Freq: Once | INTRAVENOUS | Status: AC
  Administered 2024-03-29: 2 g via INTRAVENOUS
  Filled 2024-03-29: qty 50

## 2024-03-29 MED ORDER — AMIODARONE HCL 200 MG PO TABS: 200.0000 mg | ORAL_TABLET | Freq: Two times a day (BID) | ORAL | Status: AC

## 2024-03-29 NOTE — Plan of Care (Signed)
 Transferring to rehab for continuing care Problem: Education: Goal: Knowledge of General Education information will improve Description: Including pain rating scale, medication(s)/side effects and non-pharmacologic comfort measures Outcome: Adequate for Discharge   Problem: Health Behavior/Discharge Planning: Goal: Ability to manage health-related needs will improve Outcome: Adequate for Discharge   Problem: Clinical Measurements: Goal: Ability to maintain clinical measurements within normal limits will improve Outcome: Adequate for Discharge Goal: Will remain free from infection Outcome: Adequate for Discharge Goal: Diagnostic test results will improve Outcome: Adequate for Discharge Goal: Respiratory complications will improve Outcome: Adequate for Discharge Goal: Cardiovascular complication will be avoided Outcome: Adequate for Discharge   Problem: Activity: Goal: Risk for activity intolerance will decrease Outcome: Adequate for Discharge   Problem: Nutrition: Goal: Adequate nutrition will be maintained Outcome: Adequate for Discharge   Problem: Coping: Goal: Level of anxiety will decrease Outcome: Adequate for Discharge   Problem: Elimination: Goal: Will not experience complications related to bowel motility Outcome: Adequate for Discharge Goal: Will not experience complications related to urinary retention Outcome: Adequate for Discharge   Problem: Pain Managment: Goal: General experience of comfort will improve and/or be controlled Outcome: Adequate for Discharge   Problem: Safety: Goal: Ability to remain free from injury will improve Outcome: Adequate for Discharge   Problem: Skin Integrity: Goal: Risk for impaired skin integrity will decrease Outcome: Adequate for Discharge

## 2024-03-29 NOTE — H&P (Shared)
 Called report to [employer], SBAR followed, questions asked and answered, skin discussed in detail, asked to leave port accessed for continue antibiotic treatments.

## 2024-03-29 NOTE — Discharge Summary (Signed)
 Physician Discharge Summary  [name] FMW:[id number] DOB: 07-12-1951 DOA: 03/21/2024  PCP: Jefferey Fitch, MD  Admit date: 03/21/2024 Discharge date: 03/29/2024  Admitted From: home Disposition:  SNF  Recommendations for Outpatient Follow-up:  Follow up with PCP in 1-2 weeks Please obtain BMP/CBC in one week  Home Health: none Equipment/Devices: none  Discharge Condition: stable CODE STATUS: DNR Diet Orders (From admission, onward)     Start     Ordered   03/26/24 0926  Diet regular Room service appropriate? Yes with Assist; Fluid consistency: Thin  Diet effective now       Question Answer Comment  Room service appropriate? Yes with Assist   Fluid consistency: Thin      03/26/24 0926            Brief Narrative / Interim history: 73 year old female with breast cancer now in remission, recently admitted for septic arthritis of SI joint and sacral osteomyelitis, diastolic CHF, history of PE on Eliquis  here with an unresponsive episode.  She has had few hospitalizations this year in May, June, July, diagnosed with SI septic arthritis currently on 6 weeks of antibiotics.  She was readmitted 7/18-22 for vomiting, CT showed thickening of the esophagus, she improved with conservative management.  Of note, she had an EGD in May 2025 for vomiting/hematemesis, and it showed congestive gastropathy only.  Seen by ID as an outpatient on 7/25, due to rash Rocephin  was changed to ertapenem .  On the day of admission patient was found to be slumped on the toilet, groggy, and out of responsiveness.  She was brought to the ER and admitted to the hospital.  Was found to have elevated lactic acid  Hospital Course / Discharge diagnoses: Principal Problem:   Acute metabolic encephalopathy Active Problems:   Septic arthritis of sacroiliac joint (HCC)   Rash   Narrow complex tachycardia (HCC)   Essential hypertension   Chronic diastolic CHF (congestive heart failure) (HCC)   Breast  cancer metastasized to multiple sites (HCC)   Anemia of chronic disease   Obesity, Class III, BMI 40-49.9 (morbid obesity)   History of pulmonary embolism   Hypokalemia   Hypomagnesemia   Bacteremia due to group B Streptococcus   HLD (hyperlipidemia)   Lactic acidosis   AKI (acute kidney injury) (HCC)   GAD (generalized anxiety disorder)   Hyponatremia   Principal problem Unresponsive episode, lactic acidosis -patient was found to have tachyarrhythmia, see below, which may have contributed to her unresponsive episode.  Lactate was very elevated on admission, unclear reason, not septic.  Lactate improved   Active problems Narrow complex tachycardia -Coreg  was hold on admission due to hypotension.  She developed narrow complex tachycardia, resolved with diltiazem .  Cardiology suspected atrial tachycardia, possibly junctional tachycardia.  Her regimen was changed to metoprolol  and amiodarone , no further episodes on telemetry.  Due to intermittent hypotension, metoprolol  is on hold and continue amiodarone  alone.  Blood pressure stabilized Acute metabolic encephalopathy, generalized weakness, FTT -she seems to have had a broader decline in function.  She has been having poor p.o. intake in the last 4 months as well.  Palliative consulted, appreciate input.  Now DNR.  PT/OT evaluation recommends SNF, she is stable for discharge  Septic arthritis of sacroiliac joint, group B strep bacteremia -currently on ertapenem  and doxycycline .  End date 03/31/2024 Rash, eosinophilia - This developed in the last few weeks but patient didn't note it until after the last admission.  ID suspect it is Rocephin  allergy.  DRESS ruled out.  Rash resolved Hyponatremia -sodium normalized  GAD -resume home medications AKI (acute kidney injury) (HCC) - Creatinine 1.3 on admission up from baseline 0.8. Improved to baseline with holding diuretics, losartan  HLD -continue home medications Hypomagnesemia,  hypokalemia-replenished prior to discharge History of pulmonary embolism - Continue home Eliquis  Obesity, Class III - BMI 41.8, complicates care Anemia of chronic disease - Hgb stable, no clinical bleeding  Sepsis ruled out   Discharge Instructions  Discharge Instructions     [facility] pharmacist to adjust dose for Vancomycin , Aminoglycosides and other anti-infective therapies as requested by physician.   Complete by: As directed    [facility] to provide Cath Flo 2mg    Complete by: As directed    Administer for PICC line occlusion and as ordered by physician for other access device issues.   Anaphylaxis Kit: Provided to treat any anaphylactic reaction to the medication being provided to the patient if First Dose or when requested by physician   Complete by: As directed    Epinephrine  1mg /ml vial / amp: Administer 0.3mg  (0.47ml) subcutaneously once for moderate to severe anaphylaxis, nurse to call physician and pharmacy when reaction occurs and call 911 if needed for immediate care   Diphenhydramine  50mg /ml IV vial: Administer 25-50mg  IV/IM PRN for first dose reaction, rash, itching, mild reaction, nurse to call physician and pharmacy when reaction occurs   Sodium Chloride  0.9% NS 500ml IV: Administer if needed for hypovolemic blood pressure drop or as ordered by physician after call to physician with anaphylactic reaction   Change dressing on IV access line weekly and PRN   Complete by: As directed    Flush IV access with Sodium Chloride  0.9% and Heparin  10 units/ml or 100 units/ml   Complete by: As directed    Home infusion instructions - [facility]   Complete by: As directed    Instructions: Flush IV access with Sodium Chloride  0.9% and Heparin  10units/ml or 100units/ml   Change dressing on IV access line: Weekly and PRN   Instructions Cath Flo 2mg : Administer for PICC Line occlusion and as ordered by physician for other access device   [facility] pharmacist to adjust dose for: Vancomycin , Aminoglycosides and other anti-infective therapies as requested by physician   Method of administration may be changed at the discretion of home infusion pharmacist based upon assessment of the patient and/or caregiver's ability to self-administer the medication ordered   Complete by: As directed       Allergies as of 03/29/2024       Reactions   Penicillins Hives, Other (See Comments)   PATIENT HAS TOLERATED Cephalosporins   Adhesive [tape] Other (See Comments)   Tears the skin   Ceftriaxone  Rash   Concern for drug rash noted 03/20/24   Daptomycin  Rash   Vague concern for drug rash as patient was on ceftriaxone  and daptomycin  at the same time        Medication List     STOP taking these medications    amLODipine  10 MG tablet Commonly known as: NORVASC    carvedilol  12.5 MG tablet Commonly known as: COREG    cefTRIAXone  IVPB Commonly known as: ROCEPHIN    daptomycin  IVPB Commonly known as: CUBICIN    ertapenem  1 g injection Commonly known as: INVANZ  Replaced by: ertapenem  IVPB   losartan  100 MG tablet Commonly known as: COZAAR    predniSONE 5 MG tablet Commonly known as: DELTASONE   spironolactone  25 MG tablet Commonly known as: ALDACTONE   TAKE these medications    acetaminophen  500 MG tablet Commonly known as: TYLENOL  Take 500-1,000 mg by mouth every 6 (six) hours as needed for mild pain (pain score 1-3), fever or moderate pain (pain score 4-6).   albuterol  108 (90 Base) MCG/ACT inhaler Commonly known as: VENTOLIN  HFA Inhale 2 puffs into the lungs every 6 (six) hours as needed for wheezing.   ALPRAZolam  1 MG tablet Commonly known as: XANAX  Take 1 tablet (1 mg total) by mouth 3 (three) times daily as needed for anxiety.   amiodarone  200 MG tablet Commonly known as: PACERONE  Take 1 tablet (200 mg total) by mouth 2 (two) times daily.   apixaban  5 MG Tabs tablet Commonly known as: Eliquis  Take  1 tablet (5 mg total) by mouth 2 (two) times daily.   CALCIUM  PO Take 2 tablets by mouth daily.   cyclobenzaprine  5 MG tablet Commonly known as: FLEXERIL  Take 1 tablet (5 mg total) by mouth 2 (two) times daily as needed for muscle spasms.   doxycycline  100 MG tablet Commonly known as: VIBRA -TABS Take 1 tablet (100 mg total) by mouth every 12 (twelve) hours for 7 days.   DULoxetine  60 MG capsule Commonly known as: CYMBALTA  Take 60 mg by mouth daily.   ertapenem  IVPB Commonly known as: INVANZ  Inject 1 g into the vein daily for 7 days. Indication:  L-iliac fossa abscess  First Dose: Yes Last Day of Therapy:  03/30/24 Labs - Once weekly:  CBC/D and BMP, Labs - Once weekly: ESR and CRP Method of administration: Mini-Bag Plus / Gravity Method of administration may be changed at the discretion of home infusion pharmacist based upon assessment of the patient and/or caregiver's ability to self-administer the medication ordered. Replaces: ertapenem  1 g injection   furosemide  40 MG tablet Commonly known as: LASIX  Take 40 mg by mouth as needed for fluid or edema.   gabapentin  300 MG capsule Commonly known as: NEURONTIN  TAKE 2 CAPSULES BY MOUTH THREE TIMES DAILY   ondansetron  8 MG tablet Commonly known as: ZOFRAN  Take 1 tablet (8 mg total) by mouth every 8 (eight) hours as needed for nausea or vomiting.   oxyCODONE  5 MG immediate release tablet Commonly known as: Oxy IR/ROXICODONE  Take 1 tablet (5 mg total) by mouth every 4 (four) hours as needed for moderate pain (pain score 4-6) or severe pain (pain score 7-10). What changed:  medication strength how much to take when to take this reasons to take this   pantoprazole  40 MG tablet Commonly known as: Protonix  Take 1 tablet (40 mg total) by mouth daily. What changed:  when to take this reasons to take this   rosuvastatin  10 MG tablet Commonly known as: Crestor  Take 1 tablet (10 mg total) by mouth daily.   temazepam  30 MG  capsule Commonly known as: RESTORIL  Take 1 capsule (30 mg total) by mouth at bedtime as needed for sleep.               Discharge Care Instructions  (From admission, onward)           Start     Ordered   03/23/24 0000  Change dressing on IV access line weekly and PRN  (Home infusion instructions - [facility] )        03/23/24 1610            Contact information for follow-up providers     [facility] Follow up on 04/07/2024.  Specialty: Cardiology Why: at 10:30 pm  [facility], Entrance C Free Valet Parking Available Contact information: [address] [place]  [address] [phone]             Contact information for after-discharge care     Destination     [facility] .   Service: Skilled Nursing Contact information: [address] [place]  [address] [phone]                     Consultations: Cardiology   Procedures/Studies:  EEG adult Result Date: Apr 14, 2024 Gregg Lek, MD     04-14-2024  6:40 PM Patient Name: IYANAH DEMONT MRN: [id number] Epilepsy Attending: Lek Gregg Referring Physician/Provider: Date: 14-Apr-2024 Duration: 22 ,minutes Patient history: 73 year old man with altered mental status, EEG to assess for seizure Level of alertness: Awake, drowsy AEDs during EEG study: None Technical aspects: This EEG study was done with scalp electrodes positioned according to the 10-20 International system of electrode placement. Electrical activity was reviewed with band pass filter of 1-70Hz , sensitivity of 7 uV/mm, display speed of 42mm/sec with a 60Hz  notched filter applied as appropriate. EEG data were recorded continuously and digitally stored.  Video monitoring was available and reviewed as appropriate. Description: The posterior dominant rhythm consists of 6-8 Hz activity of moderate voltage  (25-35 uV) seen predominantly in posterior head regions, symmetric and reactive to eye opening and eye closing. Drowsiness was characterized by attenuation of the posterior background rhythm. Hyperventilation and photic stimulation were not performed.   ABNORMALITY - Continuous slow, generalized IMPRESSION: This study is suggestive of mild diffuse encephalopathy, nonspecific etiology but likely related to sedation, toxic-metabolic etiology. No seizures or epileptiform discharges were seen throughout the recording. Lek Gregg MD Neurology    MR BRAIN W WO CONTRAST Result Date: 03/16/2024 CLINICAL DATA:  73 year old female with unexplained altered mental status. Breast cancer. EXAM: MRI HEAD WITHOUT AND WITH CONTRAST TECHNIQUE: Multiplanar, multiecho pulse sequences of the brain and surrounding structures were obtained without and with intravenous contrast. CONTRAST:  10mL GADAVIST  GADOBUTROL  1 MMOL/ML IV SOLN COMPARISON:  Brain MRI without and with contrast 02/12/2024 FINDINGS: Brain: Stable cerebral volume from last month. No restricted diffusion to suggest acute infarction. No midline shift, mass effect, evidence of mass lesion, ventriculomegaly, extra-axial collection or acute intracranial hemorrhage. Cervicomedullary junction and pituitary are within normal limits. Inadvertently, pre contrast axial T1 weighted imaging was not obtained. But precontrast sagittal T1 was. No abnormal enhancement identified. No abnormal dural thickening or enhancement identified. Mild for age patchy and scattered cerebral white matter T2 and FLAIR hyperintensity in the cerebral hemispheres is stable. More moderately advanced T2 and FLAIR heterogeneity throughout the pons is stable. No chronic cerebral blood products on SWI. No cortical encephalomalacia identified. Vascular: Major intracranial vascular flow voids are preserved. Skull and upper cervical spine: Negative visible cervical spine and spinal cord. Visualized bone marrow  signal is within normal limits. Sinuses/Orbits: Stable and negative. Other: Mastoids remain well aerated. Visible internal auditory structures appear normal. Suggestion of small but progressed bilateral upper cervical lymphadenopathy on coronal diffusion and sagittal images, nonspecific. IMPRESSION: 1. No acute or metastatic abnormality of the brain identified. 2. Evidence of increased small bilateral upper cervical lymph nodes since last month, nonspecific, but more resembles infectious or inflammatory rather than metastatic nodes. Electronically Signed   By: VEAR Hurst M.D.   On: 03/16/2024 10:31   CT CHEST ABDOMEN PELVIS W CONTRAST  Result Date: 03/12/2024 CLINICAL DATA:  Sepsis, abdominal pain, metastatic breast cancer * Tracking Code: BO * EXAM: CT CHEST, ABDOMEN, AND PELVIS WITH CONTRAST TECHNIQUE: Multidetector CT imaging of the chest, abdomen and pelvis was performed following the standard protocol during bolus administration of intravenous contrast. RADIATION DOSE REDUCTION: This exam was performed according to the departmental dose-optimization program which includes automated exposure control, adjustment of the mA and/or kV according to patient size and/or use of iterative reconstruction technique. CONTRAST:  75mL OMNIPAQUE  IOHEXOL  350 MG/ML SOLN COMPARISON:  MR left hip, 02/12/2024, CT chest abdomen pelvis, 11/14/2023 FINDINGS: CT CHEST FINDINGS Cardiovascular: Right chest port catheter. Normal heart size. No pericardial effusion. Mediastinum/Nodes: No enlarged mediastinal, hilar, or axillary lymph nodes. Diffuse circumferential wall thickening of the esophagus (series 3, image 31, series 7, image 75). Thyroid and trachea without significant findings. Lungs/Pleura: New trace right pleural effusion. Subpleural radiation fibrosis of the anterior right lung. Musculoskeletal: Status post bilateral mastectomy and bilateral axillary lymph node dissection. No acute osseous findings. Partially callused subacute  fractures of the anterolateral right ribs (series 5, image 62). Partially callused, subacute to chronic nondisplaced fracture of the sternal body (series 7, image 73). CT ABDOMEN PELVIS FINDINGS Hepatobiliary: No solid liver abnormality. Focal fatty deposition adjacent to the falciform ligament, characteristic in appearance and location. Tiny cyst of the anterior liver (series 3, image 48). Status post cholecystectomy. Mild postoperative biliary ductal dilatation. Pancreas: Unremarkable. No pancreatic ductal dilatation or surrounding inflammatory changes. Spleen: Normal in size without significant abnormality. Adrenals/Urinary Tract: Adrenal glands are unremarkable. Simple, benign right renal cortical cysts for which no further follow-up or characterization is required. Kidneys are otherwise normal, without renal calculi, solid lesion, or hydronephrosis. Bladder is unremarkable. Stomach/Bowel: Stomach is within normal limits. Appendix not clearly visualized. No evidence of bowel wall thickening, distention, or inflammatory changes. Vascular/Lymphatic: No significant vascular findings are present. No enlarged abdominal or pelvic lymph nodes. Reproductive: Calcified uterine fibroids. Other: No abdominal wall hernia or abnormality. No ascites. Musculoskeletal: Interval increase in bony resorption and sclerosis about the left sacroiliac joint (series 3, image 92). IMPRESSION: 1. Diffuse circumferential wall thickening of the esophagus, consistent with nonspecific infectious or inflammatory esophagitis. 2. New trace right pleural effusion. 3. Interval increase in bony resorption and sclerosis about the left sacroiliac joint, concerning for ongoing septic arthritis. 4. Status post bilateral mastectomy and bilateral axillary lymph node dissection. No evidence of lymphadenopathy or metastatic disease in the chest, abdomen, or pelvis. Electronically Signed   By: Marolyn JONETTA Jaksch M.D.   On: 03/12/2024 21:31   DG Chest 2  View Result Date: 03/12/2024 EXAM: 2 VIEW(S) XRAY OF THE CHEST 03/12/2024 05:15:05 PM COMPARISON: None available. CLINICAL HISTORY: A 73 y.o. female was evaluated in triage. Pt complains of fatigue. Report having abd pain, n/v/d since yesterday. Hx of breast CA getting chemo. Also had L hip osteo on IV abx x 6 weeks. FINDINGS: LUNGS AND PLEURA: Bibasilar atelectasis or infiltrates. Round nodule or opacity projecting over the right hilum may be the pulmonary artery, seen en face; lymphadenopathy or pulmonary nodule are not excluded. Consider CT for further evaluation. No pleural effusion or pneumothorax. HEART AND MEDIASTINUM: No acute abnormality of the cardiac and mediastinal silhouettes. BONES AND SOFT TISSUES: No acute osseous abnormality. LINES AND TUBES: Right chest wall port-a-cath with tip in the right atrium. IMPRESSION: 1. Round nodule or opacity projecting over the right hilum. Differential includes pulmonary artery, lymphadenopathy, or pulmonary nodule. Consider CT for further evaluation. 2. Bibasilar atelectasis  or infiltrates. Electronically signed by: Norman Gatlin MD 03/12/2024 05:23 PM EDT RP Workstation: [id number]     Subjective: - no chest pain, shortness of breath, no abdominal pain, nausea or vomiting.   Discharge Exam: BP 115/73 (BP Location: Left Arm)   Pulse 89   Temp 98.7 F (37.1 C) (Oral)   Resp 19   Ht 5' 3 (1.6 m)   Wt 107 kg   SpO2 99%   BMI 41.79 kg/m   General: Pt is alert, awake, not in acute distress Cardiovascular: RRR, S1/S2 +, no rubs, no gallops Respiratory: CTA bilaterally, no wheezing, no rhonchi Abdominal: Soft, NT, ND, bowel sounds + Extremities: no edema, no cyanosis    The results of significant diagnostics from this hospitalization (including imaging, microbiology, ancillary and laboratory) are listed below for reference.     Microbiology: No results found for this or any previous visit (from the past 240 hours).   Labs: Basic  Metabolic Panel: Recent Labs  Lab 03/23/24 0740 03/24/24 0543 03/25/24 0225 03/26/24 0435 03/29/24 0355  NA 131* 131* 131* 135 136  K 4.1 3.9 3.9 3.5 3.3*  CL 103 105 106 107 105  CO2 18* 17* 19* 20* 21*  GLUCOSE 96 92 87 88 87  BUN 19 20 20 12  5*  CREATININE 1.08* 0.97 0.93 0.64 0.54  CALCIUM  7.7* 7.3* 7.6* 7.9* 8.2*  MG  --   --   --  1.6* 1.4*   Liver Function Tests: Recent Labs  Lab 03/23/24 0740 03/24/24 0543 03/25/24 0225 03/26/24 0435  AST 18 16 15 17   ALT 17 16 16 15   ALKPHOS 84 85 73 66  BILITOT 0.7 1.0 1.1 1.2  PROT 4.1* 4.0* 4.1* 4.3*  ALBUMIN  1.5* <1.5* 1.5* 1.9*   CBC: Recent Labs  Lab 03/23/24 0740 03/24/24 0543 03/25/24 0225 03/26/24 0435 03/29/24 0355  WBC 15.4* 14.6* 13.7* 10.4  --   NEUTROABS  --  6.0  --   --   --   HGB 10.8* 10.4* 9.7* 8.7* 8.8*  HCT 29.7* 28.2* 26.7* 24.0* 24.2*  MCV 74.4* 74.8* 75.2* 75.0*  --   PLT 153 122* 133* 141*  --    CBG: No results for input(s): GLUCAP in the last 168 hours. Hgb A1c No results for input(s): HGBA1C in the last 72 hours. Lipid Profile No results for input(s): CHOL, HDL, LDLCALC, TRIG, CHOLHDL, LDLDIRECT in the last 72 hours. Thyroid function studies No results for input(s): TSH, T4TOTAL, T3FREE, THYROIDAB in the last 72 hours.  Invalid input(s): FREET3 Urinalysis    Component Value Date/Time   COLORURINE AMBER (A) 03/14/2024 1719   APPEARANCEUR HAZY (A) 03/14/2024 1719   LABSPEC 1.038 (H) 03/14/2024 1719   LABSPEC 1.020 10/28/2014 1142   PHURINE 6.0 03/14/2024 1719   GLUCOSEU NEGATIVE 03/14/2024 1719   GLUCOSEU Negative 10/28/2014 1142   HGBUR SMALL (A) 03/14/2024 1719   BILIRUBINUR MODERATE (A) 03/14/2024 1719   BILIRUBINUR Negative 10/28/2014 1142   KETONESUR 20 (A) 03/14/2024 1719   PROTEINUR 100 (A) 03/14/2024 1719   UROBILINOGEN 0.2 10/28/2014 1142   NITRITE NEGATIVE 03/14/2024 1719   LEUKOCYTESUR NEGATIVE 03/14/2024 1719   LEUKOCYTESUR Trace  10/28/2014 1142    FURTHER DISCHARGE INSTRUCTIONS:   Get Medicines reviewed and adjusted: Please take all your medications with you for your next visit with your Primary MD   Laboratory/radiological data: Please request your Primary MD to go over all hospital tests and procedure/radiological results at the follow up, please ask  your Primary MD to get all Hospital records sent to his/her office.   In some cases, they will be blood work, cultures and biopsy results pending at the time of your discharge. Please request that your primary care M.D. goes through all the records of your hospital data and follows up on these results.   Also Note the following: If you experience worsening of your admission symptoms, develop shortness of breath, life threatening emergency, suicidal or homicidal thoughts you must seek medical attention immediately by calling 911 or calling your MD immediately  if symptoms less severe.   You must read complete instructions/literature along with all the possible adverse reactions/side effects for all the Medicines you take and that have been prescribed to you. Take any new Medicines after you have completely understood and accpet all the possible adverse reactions/side effects.    Do not drive when taking Pain medications or sleeping medications (Benzodaizepines)   Do not take more than prescribed Pain, Sleep and Anxiety Medications. It is not advisable to combine anxiety,sleep and pain medications without talking with your primary care practitioner   Special Instructions: If you have smoked or chewed Tobacco  in the last 2 yrs please stop smoking, stop any regular Alcohol   and or any Recreational drug use.   Wear Seat belts while driving.   Please note: You were cared for by a hospitalist during your hospital stay. Once you are discharged, your primary care physician will handle any further medical issues. Please note that NO REFILLS for any discharge medications will  be authorized once you are discharged, as it is imperative that you return to your primary care physician (or establish a relationship with a primary care physician if you do not have one) for your post hospital discharge needs so that they can reassess your need for medications and monitor your lab values.  Time coordinating discharge: 40 minutes  SIGNED:  Nilda Fendt, MD, PhD 03/29/2024, 9:11 AM

## 2024-03-29 NOTE — TOC Transition Note (Signed)
 Transition of Care [facility]) - Discharge Note   Patient Details  Name: [name] MRN: [id number] Date of Birth: 03-31-1951  [facility]) CM/SW Contact:  Gwenn Julien Norris, LCSW Phone Number: 03/29/2024, 10:52 AM   Clinical Narrative:  Pt for dc to [facility] today. Spoke to Lauren in admissions who confirmed they are prepared to admit pt to room 211. Pt's dtr Danielle aware of dc and reports agreeable. RN provided with number for report and PTAR arranged for transport. SW signing off at dc.   Julien Gwenn, MSW, LCSW [phone] (coverage)       Final next level of care: Skilled Nursing Facility Barriers to Discharge: Barriers Resolved   Patient Goals and CMS Choice   CMS Medicare.gov Compare Post Acute Care list provided to:: Patient Represenative (must comment) Choice offered to / list presented to : Adult Children      Discharge Placement              Patient chooses bed at: Other - please specify in the comment section below: ([facility]) Patient to be transferred to facility by: PTAR Name of family member notified: Danielle/dtr Patient and family notified of of transfer: 03/29/24  Discharge Plan and Services Additional resources added to the After Visit Summary for   In-house Referral: Clinical Social Work Discharge Planning Services: CM Consult Post Acute Care Choice: Skilled Nursing Facility                               Social Drivers of Health (SDOH) Interventions SDOH Screenings   Food Insecurity: No Food Insecurity (03/22/2024)  Housing: Low Risk  (03/22/2024)  Transportation Needs: No Transportation Needs (03/22/2024)  Utilities: Not At Risk (03/22/2024)  Depression (PHQ2-9): Low Risk  (03/19/2024)  Social Connections: Moderately Integrated (03/22/2024)  Recent Concern: Social Connections - Moderately Isolated (02/14/2024)  Tobacco Use: Low Risk  (03/21/2024)     Readmission Risk Interventions    03/16/2024    4:38  PM 02/14/2024   12:50 PM 01/21/2024    1:47 PM  Readmission Risk Prevention Plan  Transportation Screening Complete Complete Complete  PCP or Specialist Appt within 3-5 Days   Complete  HRI or Home Care Consult   Complete  Social Work Consult for Recovery Care Planning/Counseling   Complete  Palliative Care Screening   Not Applicable  Medication Review [name]) Complete Complete Complete  PCP or Specialist appointment within 3-5 days of discharge Complete Complete   HRI or Home Care Consult Complete Complete   SW Recovery Care/Counseling Consult Not Complete    Palliative Care Screening Not Applicable    Skilled Nursing Facility Not Applicable

## 2024-03-31 ENCOUNTER — Telehealth: Payer: Self-pay

## 2024-03-31 ENCOUNTER — Inpatient Hospital Stay: Admitting: Infectious Diseases

## 2024-03-31 DIAGNOSIS — F411 Generalized anxiety disorder: Secondary | ICD-10-CM | POA: Diagnosis not present

## 2024-03-31 DIAGNOSIS — M4628 Osteomyelitis of vertebra, sacral and sacrococcygeal region: Secondary | ICD-10-CM | POA: Diagnosis not present

## 2024-03-31 DIAGNOSIS — M0088 Arthritis due to other bacteria, vertebrae: Secondary | ICD-10-CM | POA: Diagnosis not present

## 2024-03-31 DIAGNOSIS — I5030 Unspecified diastolic (congestive) heart failure: Secondary | ICD-10-CM | POA: Diagnosis not present

## 2024-03-31 NOTE — Telephone Encounter (Addendum)
 Patient is at [facility] rehab [phone]) called facility to reschedule missed appointment. No answer and voicemail was full.   At 1:30pm called [facility] and was transferred to transportation line to reschedule missed appointment. No answer left voicemail to contact our office.

## 2024-04-02 ENCOUNTER — Other Ambulatory Visit: Payer: Self-pay | Admitting: Pharmacist

## 2024-04-02 ENCOUNTER — Other Ambulatory Visit: Payer: Self-pay

## 2024-04-02 ENCOUNTER — Encounter: Payer: Self-pay | Admitting: Hematology and Oncology

## 2024-04-02 DIAGNOSIS — C50911 Malignant neoplasm of unspecified site of right female breast: Secondary | ICD-10-CM

## 2024-04-03 NOTE — Progress Notes (Signed)
 Advanced Heart Failure Clinic Note   Patient ID: [name], female   DOB: [birth year], 73 y.o.   MRN: [id number]  Oncologist: Dr. Odean HF Cardiologist: Dr. Rolan  HPI: [name] is a 73 y.o. with a history of metastatic HER-2 positive breast carcinoma originally diagnosed September 2004. Started in R breast. Underwent neo-adjuvant chemo. During this time developed L breast CA. Underwent bilateral mastectomy. Lymph nodes +. Subsequently developed SVC syndrome with extensive right-sided clot - placed on coumadin .   She received a total of 6 cycles of Taxotere/Carbo/Herceptin , completed in April 2005, after which she began single agent Herceptin , given every 4 weeks now. Plan to continue on Herceptin  indefinitely.   Coronary CTA in 8/20 showed no significant CAD, calcium  score 0.   Echo 3/25 EF 55-60% with mild LVH, GLS -19.4%, normal RV.   Admitted 5/25 with hematemesis. Xarelto  held but restarted at discharge. EGD with no acute findings.    Admitted 6/25 with abscess of left iliac fossa, group B strep bacteremia and sacral osteomyelitis. Seen by ID and discharged on 6 weeks of IV abx.   Admitted 7/25 with AMS and tachyarrhythmia, HR in 140's. AHF consulted, reviewed with EP, suspected to be AT, broke with diltiazem . Started on amiodarone  200 bid for rhythm stabilization and low dose Toprol . Compensated from HF standpoint. She was discharged to SNF.  Today she returns for post hospital HF follow up with her daughter. Overall feeling fair. She remains at [facility]. She is mostly WC-bound, able to stand to transfer to toilet but significant assistance. Unable to stand today for weight. She works with PT and has dyspnea with this.  Daughter says patient's LLE is swollen. Denies palpitations, abnormal bleeding, CP, dizziness,or PND/Orthopnea. Appetite good. Last weight 259 lbs at facility, a week ago. Taking all medications provided by facility.   ECG (personally reviewed):  NSR 86 bpm  Labs 7/23: K 3.7, creatinine 1.0 Labs 2/24: K 3.6, creatinine 0.83 Labs 5/24: K 4.7, creatinine 0.82 Labs 8/25: K 3.3, creatinine 0.54  Echo 8/13:EF 60-65% Lateral s' 8.9 cm/s  Echo 12/13: EF 60-65% Lateral s' 8.3 cm/s  Echo 1/14: EF 60-65% Lateral s' 10.3 cm/s  Echo 4/14: 55-60% Lateral S' 9.8 RV mildly dilated  Echo 11/14: 55-60%, lateral s' 9.79, mild RV dilation, grade II DD Echo 3/15: 55%, mild MR, lateral s' 9.6, GLS -19.2% Echo 7/15: EF 55-60% Lateral S' 9.4 GLS - 21.9  Echo 11/15: EF 60-65%, lateral S' 6.8, GLS -17.2%, mild RV dilation with normal systolic function, mild MR.  Echo 2/16: EF 60-65%, lateral S' 14.4, GLS -23.5%, normal diastolic function, mild RV dilation with normal RV systolic function.  Echo 8/16: EF 60-65%, mild LVH, normal RV size and systolic function, lateral s' 86.7, GLS -17%.  Echo 11/16:  EF 60-65% Lateral S' 10.2, GLS -20.2% Echo 5/17: EF 60-65%, mild LVH, lateral s' 12.2, GLS -20.8%, grade II diastolic dysfunction, normal RV. Echo 9/17: EF 55-60%, normal diastolic function, GLS -18.3% Echo 12/17:  EF 55-60%, moderate diastolic dysfunction, GLS -20.1%, normal RV size and systolic function Echo 5/18: EF 55-60%, GLS -18.4%, normal diastolic function, normal RV size and systolic function.  Echo 1/19: EF 55-60%, GLS -18.9%, normal diastolic function, RV normal size and systolic function.  Echo 7/19: EF 60-65%, GLS -22.1%.  Echo 1/20: EF 60-65%, normal diastolic function, normal RV, strain not done.  Echo 6/20: EF 60-65%, normal diastolic function, normal RV, GLS -24.4% Echo 4/21: EF 55-60%, normal diastolic function,  normal RV, strain tracking was not accurate.  Echo 1/22: EF 60-65%, normal RV, moderate LAE, IVC normal.  Echo 9/22: EF 60-65%, normal RV Echo 7/23: EF 55-60%, GLS -19.1%, normal RV, mild MR.  Echo 2/24: EF 55%, GLS -21.6%, normal RV Echo 3/25: EF 55-60% with mild LVH, GLS -19.4%, normal RV. Echo 6/25: EF 70-75%, RV normal   ROS:  All systems negative except as listed in HPI, PMH and Problem List.  Past Medical History:  Diagnosis Date   Anemia    pt pt report   Anxiety    Bacteremia due to group B Streptococcus    June '25   Breast cancer (HCC)    mets to liver and lung   Breast cancer metastasized to multiple sites [facility]) 02/26/2013   Cellulitis    CHF (congestive heart failure) (HCC)    History of chemotherapy 09/2004   taxotere/herceptin /carboplatin   Hypertension    Neuropathy    Osteomyelitis of sacrum (HCC)    Radiation 07/31/2006   left upper chest   Radiation 06/17/2006-06/27/2006   6480 cGy bilat. chest wall   Septic arthritis (HCC)    left SI joint   SVC syndrome    Thrombosis    Current Outpatient Medications  Medication Sig Dispense Refill   acetaminophen  (TYLENOL ) 500 MG tablet Take 500-1,000 mg by mouth every 6 (six) hours as needed for mild pain (pain score 1-3), fever or moderate pain (pain score 4-6).     albuterol  (VENTOLIN  HFA) 108 (90 Base) MCG/ACT inhaler Inhale 2 puffs into the lungs every 6 (six) hours as needed for wheezing. 1 each 6   ALPRAZolam  (XANAX ) 1 MG tablet Take 1 tablet (1 mg total) by mouth 3 (three) times daily as needed for anxiety. 10 tablet 3   amiodarone  (PACERONE ) 200 MG tablet Take 1 tablet (200 mg total) by mouth 2 (two) times daily.     apixaban  (ELIQUIS ) 5 MG TABS tablet Take 1 tablet (5 mg total) by mouth 2 (two) times daily. 180 tablet 3   atorvastatin (LIPITOR) 20 MG tablet Take 20 mg by mouth daily.     CALCIUM  PO Take 2 tablets by mouth daily.     cyclobenzaprine  (FLEXERIL ) 5 MG tablet Take 1 tablet (5 mg total) by mouth 2 (two) times daily as needed for muscle spasms. 60 tablet 0   DULoxetine  (CYMBALTA ) 60 MG capsule Take 60 mg by mouth daily.     furosemide  (LASIX ) 40 MG tablet Take 40 mg by mouth as needed for fluid or edema.     gabapentin  (NEURONTIN ) 300 MG capsule TAKE 2 CAPSULES BY MOUTH THREE TIMES DAILY (Patient taking differently: Take 600 mg by  mouth 3 (three) times daily.) 540 capsule 1   omeprazole (PRILOSEC) 20 MG capsule Take 20 mg by mouth daily.     ondansetron  (ZOFRAN ) 8 MG tablet Take 1 tablet (8 mg total) by mouth every 8 (eight) hours as needed for nausea or vomiting. 30 tablet 0   oxyCODONE  (OXY IR/ROXICODONE ) 5 MG immediate release tablet Take 1 tablet (5 mg total) by mouth every 4 (four) hours as needed for moderate pain (pain score 4-6) or severe pain (pain score 7-10). 10 tablet 0   temazepam  (RESTORIL ) 30 MG capsule Take 1 capsule (30 mg total) by mouth at bedtime as needed for sleep. 2 capsule 0   pantoprazole  (PROTONIX ) 40 MG tablet Take 1 tablet (40 mg total) by mouth daily. (Patient not taking: Reported on 04/07/2024) 30 tablet 0  rosuvastatin  (CRESTOR ) 10 MG tablet Take 1 tablet (10 mg total) by mouth daily. (Patient not taking: Reported on 04/07/2024) 30 tablet 11   No current facility-administered medications for this encounter.   Facility-Administered Medications Ordered in Other Encounters  Medication Dose Route Frequency Provider Last Rate Last Admin   sodium chloride  flush (NS) 0.9 % injection 10 mL  10 mL Intravenous PRN Magrinat, Gustav C, MD   10 mL at 12/15/15 1200   sodium chloride  flush (NS) 0.9 % injection 10 mL  10 mL Intracatheter PRN Magrinat, Sandria BROCKS, MD   10 mL at 08/14/18 1116   sodium chloride  flush (NS) 0.9 % injection 10 mL  10 mL Intracatheter PRN Magrinat, Sandria BROCKS, MD        Wt Readings from Last 3 Encounters:  03/29/24 107 kg (235 lb 14.3 oz)  03/12/24 117.5 kg (259 lb)  02/13/24 102.1 kg (225 lb)   BP (!) 98/52   Pulse 97   Ht 5' 3 (1.6 m)   SpO2 95%   BMI 41.79 kg/m   PHYSICAL EXAM: General:  NAD. No resp difficulty, arrived in [facility], elderly, fatigued-appearing HEENT: Normal Neck: Supple. No JVD. Cor: Regular rate & rhythm. No rubs, gallops or murmurs. Lungs: Clear, diminished in bases Abdomen: Soft, obese, nontender, nondistended.  Extremities: No cyanosis, clubbing, rash,  BLE lymphedema Neuro: Alert & oriented x 3, moves all 4 extremities w/o difficulty. Flat affect, slow to respond to questions  ASSESSMENT & PLAN: 1. Tachyarrhythmia: Suspected junctional tachycardia with AV dissociation vs atrial tachycardia. Dr. Rolan reviewed with Dr. Nancey who suspected it to be atrial tachycardia. Broke with Diltiazem .TSH normal.  NSR on ECG today, denies palpitations. - Previously started on low-dose Toprol , now off with low BP. - Continue amio 200 mg bid. Follow LFTs and TSH. Will need regular eye exam. - Place 2 week Zio to assess for AT burden 2. L Breast Cancer s/p bilateral mastectomies: Symptomatically stable. Echo 3/25 EF and strain are stable. Echo 6/25 EF 70-75%, GLS strain -20.2 She will be continuing Herceptin  indefinitely.   - Continue echo screening but have increased the interval given stability over a number of years.  3.  Chronic diastolic CHF: She is not volume overloaded on exam, NYHA class II-III, confounded by deconditioning and recent hospitalizations. Suspect BLE is chronic lymphedema - Continue to take Lasix  40 mg prn. BMET and BNP today. 4. Suspected OSA: She has not wanted to do a sleep study.    - No change. 5.  HTN: BP low recently  6. Group B strep bacteremia: septic arthritis disgnosed in 6/25. Started on linezolid /ertapenem . Recent admission LA was 8 - seen by ID, linezolid >doxycycline  (complete 03/30/24) 7. SVC syndrome: She has been transitioned from warfarin to Eliquis .  No bleeding issues. CBC today. 8. Coronary disease risk: She has a strong FH of coronary disease, as well as HTN.  Coronary CTA in 8/20 showed no significant coronary disease and calcium  score 0. No chest pain - No ASA given Eliquis  use.  9. Obesity: Body mass index is 41.79 kg/m. - Insurance does not cover GLP-1 agonist.   Keep follow up with Dr.McLean, as scheduled. Will keep scheduled echo arranged at this visit.   Harlene HERO [facility] FNP-BC 04/07/2024

## 2024-04-06 ENCOUNTER — Telehealth (HOSPITAL_COMMUNITY): Payer: Self-pay | Admitting: *Deleted

## 2024-04-06 ENCOUNTER — Telehealth (HOSPITAL_COMMUNITY): Payer: Self-pay

## 2024-04-06 DIAGNOSIS — G9341 Metabolic encephalopathy: Secondary | ICD-10-CM | POA: Diagnosis not present

## 2024-04-06 DIAGNOSIS — M0088 Arthritis due to other bacteria, vertebrae: Secondary | ICD-10-CM | POA: Diagnosis not present

## 2024-04-06 DIAGNOSIS — R000 Tachycardia, unspecified: Secondary | ICD-10-CM | POA: Diagnosis not present

## 2024-04-06 DIAGNOSIS — R21 Rash and other nonspecific skin eruption: Secondary | ICD-10-CM | POA: Diagnosis not present

## 2024-04-06 NOTE — Telephone Encounter (Signed)
 Called to confirm/remind patient of their appointment at the Advanced Heart Failure Clinic on  04/07/24.   Appointment:   [x] Confirmed  [] Left mess   [] No answer/No voice mail  [] VM Full/unable to leave message  [] Phone not in service  And reminded to bring in all medications and/or complete list.

## 2024-04-06 NOTE — Telephone Encounter (Signed)
 Called to confirm/remind patient of their appointment at the [facility] Failure Clinic on 04/07/24.     Appointment:              [] Confirmed             [x] Left mess              [] No answer/No voice mail             [] Phone not in service   Patient reminded to bring all medications and/or complete list.   Confirmed patient has transportation. Gave directions, instructed to utilize valet parking.

## 2024-04-07 ENCOUNTER — Ambulatory Visit (HOSPITAL_COMMUNITY)
Admit: 2024-04-07 | Discharge: 2024-04-07 | Disposition: A | Discharge status: Home / Self Care | Source: Ambulatory Visit | Attending: Family Medicine | Admitting: Family Medicine

## 2024-04-07 ENCOUNTER — Other Ambulatory Visit: Payer: Self-pay

## 2024-04-07 ENCOUNTER — Inpatient Hospital Stay (HOSPITAL_COMMUNITY)
Admission: RE | Admit: 2024-04-07 | Discharge: 2024-04-07 | Disposition: A | Discharge status: Home / Self Care | Source: Ambulatory Visit | Attending: Cardiology | Admitting: Cardiology

## 2024-04-07 ENCOUNTER — Other Ambulatory Visit (HOSPITAL_COMMUNITY): Payer: Self-pay | Admitting: Cardiology

## 2024-04-07 ENCOUNTER — Ambulatory Visit (HOSPITAL_COMMUNITY): Payer: Self-pay | Admitting: Family Medicine

## 2024-04-07 ENCOUNTER — Encounter (HOSPITAL_COMMUNITY): Payer: Self-pay

## 2024-04-07 VITALS — BP 98/52 | HR 97 | Ht 63.0 in

## 2024-04-07 DIAGNOSIS — R0600 Dyspnea, unspecified: Secondary | ICD-10-CM | POA: Insufficient documentation

## 2024-04-07 DIAGNOSIS — I871 Compression of vein: Secondary | ICD-10-CM

## 2024-04-07 DIAGNOSIS — Z9013 Acquired absence of bilateral breasts and nipples: Secondary | ICD-10-CM | POA: Diagnosis not present

## 2024-04-07 DIAGNOSIS — Z87898 Personal history of other specified conditions: Secondary | ICD-10-CM

## 2024-04-07 DIAGNOSIS — Z853 Personal history of malignant neoplasm of breast: Secondary | ICD-10-CM | POA: Insufficient documentation

## 2024-04-07 DIAGNOSIS — I110 Hypertensive heart disease with heart failure: Secondary | ICD-10-CM | POA: Insufficient documentation

## 2024-04-07 DIAGNOSIS — Z6841 Body Mass Index (BMI) 40.0 and over, adult: Secondary | ICD-10-CM | POA: Insufficient documentation

## 2024-04-07 DIAGNOSIS — Z79899 Other long term (current) drug therapy: Secondary | ICD-10-CM | POA: Insufficient documentation

## 2024-04-07 DIAGNOSIS — Z171 Estrogen receptor negative status [ER-]: Secondary | ICD-10-CM | POA: Diagnosis not present

## 2024-04-07 DIAGNOSIS — I4719 Other supraventricular tachycardia: Secondary | ICD-10-CM

## 2024-04-07 DIAGNOSIS — I10 Essential (primary) hypertension: Secondary | ICD-10-CM | POA: Diagnosis not present

## 2024-04-07 DIAGNOSIS — M7989 Other specified soft tissue disorders: Secondary | ICD-10-CM | POA: Diagnosis not present

## 2024-04-07 DIAGNOSIS — E669 Obesity, unspecified: Secondary | ICD-10-CM | POA: Diagnosis not present

## 2024-04-07 DIAGNOSIS — I5032 Chronic diastolic (congestive) heart failure: Secondary | ICD-10-CM | POA: Insufficient documentation

## 2024-04-07 DIAGNOSIS — Z993 Dependence on wheelchair: Secondary | ICD-10-CM | POA: Insufficient documentation

## 2024-04-07 DIAGNOSIS — Z7901 Long term (current) use of anticoagulants: Secondary | ICD-10-CM | POA: Diagnosis not present

## 2024-04-07 DIAGNOSIS — C50011 Malignant neoplasm of nipple and areola, right female breast: Secondary | ICD-10-CM

## 2024-04-07 DIAGNOSIS — C50012 Malignant neoplasm of nipple and areola, left female breast: Secondary | ICD-10-CM

## 2024-04-07 DIAGNOSIS — R000 Tachycardia, unspecified: Secondary | ICD-10-CM | POA: Diagnosis not present

## 2024-04-07 LAB — BASIC METABOLIC PANEL WITH GFR
Anion gap: 12 (ref 5–15)
BUN: 8 mg/dL (ref 8–23)
CO2: 22 mmol/L (ref 22–32)
Calcium: 8.8 mg/dL — ABNORMAL LOW (ref 8.9–10.3)
Chloride: 104 mmol/L (ref 98–111)
Creatinine, Ser: 0.97 mg/dL (ref 0.44–1.00)
GFR, Estimated: >60 mL/min (ref >60)
Glucose, Bld: 119 mg/dL — ABNORMAL HIGH (ref 70–99)
Potassium: 3 mmol/L — ABNORMAL LOW (ref 3.5–5.1)
Sodium: 138 mmol/L (ref 135–145)

## 2024-04-07 LAB — BRAIN NATRIURETIC PEPTIDE: B Natriuretic Peptide: 51.7 pg/mL (ref 0.0–100.0)

## 2024-04-07 NOTE — Patient Instructions (Addendum)
 Thank you for coming in today  If you had labs drawn today, any labs that are abnormal the clinic will call you No news is good news  Your provider has recommended that  you wear a Zio Patch for 14 days.  This monitor will record your heart rhythm for our review.  IF you have any symptoms while wearing the monitor please press the button.  If you have any issues with the patch or you notice a red or orange light on it please call the company at [phone].  Once you remove the patch please mail it back to the company as soon as possible so we can get the results.   Medications: No changes  Follow up appointments:  Your physician recommends that you schedule a follow-up appointment in:  3 months With Dr. Rolan  Please call our office to schedule the follow-up appointment in October for  December 2025.    Do the following things EVERYDAY: Weigh yourself in the morning before breakfast. Write it down and keep it in a log. Take your medicines as prescribed Eat low salt foods--Limit salt (sodium) to 2000 mg per day.  Stay as active as you can everyday Limit all fluids for the day to less than 2 liters   At the Advanced [facility] Failure Clinic, you and your health needs are our priority. As part of our continuing mission to provide you with exceptional heart care, we have created designated Provider Care Teams. These Care Teams include your primary Cardiologist (physician) and Advanced Practice Providers (APPs- Physician Assistants and Nurse Practitioners) who all work together to provide you with the care you need, when you need it.   You may see any of the following providers on your designated Care Team at your next follow up: Dr Toribio Fuel Dr Ezra Rolan Dr. Ria Gardenia Greig Lenetta, NP Caffie Shed, [place] [facility] [place], [place] Beckey Coe, NP Tinnie Redman, PharmD   Please be sure to bring in all your medications bottles to every appointment.     Thank you for choosing [facility]  If you have any questions or concerns before your next appointment please send us  a message through [place] or call our office at [phone].    TO LEAVE A MESSAGE FOR THE NURSE SELECT OPTION 2, PLEASE LEAVE A MESSAGE INCLUDING: YOUR NAME DATE OF BIRTH CALL BACK NUMBER REASON FOR CALL**this is important as we prioritize the call backs  YOU WILL RECEIVE A CALL BACK THE SAME DAY AS LONG AS YOU CALL BEFORE 4:00 PM

## 2024-04-09 MED ORDER — POTASSIUM CHLORIDE CRYS ER 20 MEQ PO TBCR: 40.0000 meq | EXTENDED_RELEASE_TABLET | Freq: Every day | ORAL | 3 refills | Status: AC

## 2024-04-14 ENCOUNTER — Inpatient Hospital Stay: Attending: Oncology

## 2024-04-14 ENCOUNTER — Inpatient Hospital Stay

## 2024-04-14 ENCOUNTER — Inpatient Hospital Stay (HOSPITAL_BASED_OUTPATIENT_CLINIC_OR_DEPARTMENT_OTHER): Admitting: Adult Health

## 2024-04-14 ENCOUNTER — Encounter: Payer: Self-pay | Admitting: Adult Health

## 2024-04-14 VITALS — BP 143/96 | HR 82 | Temp 97.8°F | Resp 18

## 2024-04-14 DIAGNOSIS — I4891 Unspecified atrial fibrillation: Secondary | ICD-10-CM | POA: Diagnosis not present

## 2024-04-14 DIAGNOSIS — C50911 Malignant neoplasm of unspecified site of right female breast: Secondary | ICD-10-CM | POA: Diagnosis not present

## 2024-04-14 DIAGNOSIS — D649 Anemia, unspecified: Secondary | ICD-10-CM | POA: Diagnosis not present

## 2024-04-14 DIAGNOSIS — C50011 Malignant neoplasm of nipple and areola, right female breast: Secondary | ICD-10-CM

## 2024-04-14 DIAGNOSIS — C50912 Malignant neoplasm of unspecified site of left female breast: Secondary | ICD-10-CM | POA: Diagnosis not present

## 2024-04-14 DIAGNOSIS — Z7901 Long term (current) use of anticoagulants: Secondary | ICD-10-CM | POA: Diagnosis not present

## 2024-04-14 DIAGNOSIS — Z171 Estrogen receptor negative status [ER-]: Secondary | ICD-10-CM | POA: Insufficient documentation

## 2024-04-14 DIAGNOSIS — Z5112 Encounter for antineoplastic immunotherapy: Secondary | ICD-10-CM | POA: Diagnosis not present

## 2024-04-14 DIAGNOSIS — I10 Essential (primary) hypertension: Secondary | ICD-10-CM | POA: Diagnosis not present

## 2024-04-14 DIAGNOSIS — C50012 Malignant neoplasm of nipple and areola, left female breast: Secondary | ICD-10-CM

## 2024-04-14 DIAGNOSIS — C50812 Malignant neoplasm of overlapping sites of left female breast: Secondary | ICD-10-CM | POA: Diagnosis present

## 2024-04-14 LAB — CBC WITH DIFFERENTIAL (CANCER CENTER ONLY)
Abs Immature Granulocytes: 0.01 K/uL (ref 0.00–0.07)
Basophils Absolute: 0.1 K/uL (ref 0.0–0.1)
Basophils Relative: 1 %
Eosinophils Absolute: 0.9 K/uL — ABNORMAL HIGH (ref 0.0–0.5)
Eosinophils Relative: 13 %
HCT: 24.7 % — ABNORMAL LOW (ref 36.0–46.0)
Hemoglobin: 9 g/dL — ABNORMAL LOW (ref 12.0–15.0)
Immature Granulocytes: 0 %
Lymphocytes Relative: 40 %
Lymphs Abs: 2.7 K/uL (ref 0.7–4.0)
MCH: 27.3 pg (ref 26.0–34.0)
MCHC: 36.4 g/dL — ABNORMAL HIGH (ref 30.0–36.0)
MCV: 74.8 fL — ABNORMAL LOW (ref 80.0–100.0)
Monocytes Absolute: 0.8 K/uL (ref 0.1–1.0)
Monocytes Relative: 11 %
Neutro Abs: 2.4 K/uL (ref 1.7–7.7)
Neutrophils Relative %: 35 %
Platelet Count: 320 K/uL (ref 150–400)
RBC: 3.3 MIL/uL — ABNORMAL LOW (ref 3.87–5.11)
RDW: 16.2 % — ABNORMAL HIGH (ref 11.5–15.5)
WBC Count: 6.9 K/uL (ref 4.0–10.5)
nRBC: 0 % (ref 0.0–0.2)

## 2024-04-14 LAB — CMP (CANCER CENTER ONLY)
ALT: 10 U/L (ref 0–44)
AST: 13 U/L — ABNORMAL LOW (ref 15–41)
Albumin: 2.6 g/dL — ABNORMAL LOW (ref 3.5–5.0)
Alkaline Phosphatase: 90 U/L (ref 38–126)
Anion gap: 5 (ref 5–15)
BUN: 8 mg/dL (ref 8–23)
CO2: 27 mmol/L (ref 22–32)
Calcium: 8.3 mg/dL — ABNORMAL LOW (ref 8.9–10.3)
Chloride: 105 mmol/L (ref 98–111)
Creatinine: 0.55 mg/dL (ref 0.44–1.00)
GFR, Estimated: >60 mL/min (ref >60)
Glucose, Bld: 95 mg/dL (ref 70–99)
Potassium: 4.3 mmol/L (ref 3.5–5.1)
Sodium: 137 mmol/L (ref 135–145)
Total Bilirubin: 0.6 mg/dL (ref 0.0–1.2)
Total Protein: 6.3 g/dL — ABNORMAL LOW (ref 6.5–8.1)

## 2024-04-14 MED ORDER — ACETAMINOPHEN 325 MG PO TABS
650.0000 mg | ORAL_TABLET | Freq: Once | ORAL | Status: AC
  Administered 2024-04-14: 650 mg via ORAL
  Filled 2024-04-14: qty 2

## 2024-04-14 MED ORDER — SODIUM CHLORIDE 0.9% FLUSH: 10.0000 mL | INTRAVENOUS | Status: DC | PRN

## 2024-04-14 MED ORDER — HEPARIN SOD (PORK) LOCK FLUSH 100 UNIT/ML IV SOLN: 500.0000 [IU] | Freq: Once | INTRAVENOUS | Status: DC | PRN

## 2024-04-14 MED ORDER — TRASTUZUMAB-DTTB CHEMO 150 MG IV SOLR
750.0000 mg | Freq: Once | INTRAVENOUS | Status: AC
  Administered 2024-04-14: 750 mg via INTRAVENOUS
  Filled 2024-04-14: qty 35.71

## 2024-04-14 MED ORDER — SODIUM CHLORIDE 0.9 % IV SOLN: Freq: Once | INTRAVENOUS | Status: AC

## 2024-04-14 MED ORDER — LORAZEPAM 2 MG/ML IJ SOLN
1.0000 mg | Freq: Once | INTRAMUSCULAR | Status: AC
  Administered 2024-04-14: 1 mg via INTRAVENOUS
  Filled 2024-04-14: qty 1

## 2024-04-14 NOTE — Patient Instructions (Signed)
 [facility] - A DEPT OF [facility]  Discharge Instructions: Thank you for choosing [facility] to provide your oncology and hematology care.   If you have a lab appointment with the Cancer Center, please go directly to the Cancer Center and check in at the registration area.   Wear comfortable clothing and clothing appropriate for easy access to any Portacath or PICC line.   We strive to give you quality time with your provider. You may need to reschedule your appointment if you arrive late (15 or more minutes).  Arriving late affects you and other patients whose appointments are after yours.  Also, if you miss three or more appointments without notifying the office, you may be dismissed from the clinic at the provider's discretion.      For prescription refill requests, have your pharmacy contact our office and allow 72 hours for refills to be completed.    Today you received the following chemotherapy and/or immunotherapy agents Ontruzant      To help prevent nausea and vomiting after your treatment, we encourage you to take your nausea medication as directed.  BELOW ARE SYMPTOMS THAT SHOULD BE REPORTED IMMEDIATELY: *FEVER GREATER THAN 100.4 F (38 C) OR HIGHER *CHILLS OR SWEATING *NAUSEA AND VOMITING THAT IS NOT CONTROLLED WITH YOUR NAUSEA MEDICATION *UNUSUAL SHORTNESS OF BREATH *UNUSUAL BRUISING OR BLEEDING *URINARY PROBLEMS (pain or burning when urinating, or frequent urination) *BOWEL PROBLEMS (unusual diarrhea, constipation, pain near the anus) TENDERNESS IN MOUTH AND THROAT WITH OR WITHOUT PRESENCE OF ULCERS (sore throat, sores in mouth, or a toothache) UNUSUAL RASH, SWELLING OR PAIN  UNUSUAL VAGINAL DISCHARGE OR ITCHING   Items with * indicate a potential emergency and should be followed up as soon as possible or go to the Emergency Department if any problems should occur.  Please show the CHEMOTHERAPY ALERT CARD or IMMUNOTHERAPY  ALERT CARD at check-in to the Emergency Department and triage nurse.  Should you have questions after your visit or need to cancel or reschedule your appointment, please contact [facility] - A DEPT OF [facility]  Dept: [phone]  and follow the prompts.  Office hours are 8:00 a.m. to 4:30 p.m. Monday - Friday. Please note that voicemails left after 4:00 p.m. may not be returned until the following business day.  We are closed weekends and major holidays. You have access to a nurse at all times for urgent questions. Please call the main number to the clinic Dept: [phone] and follow the prompts.   For any non-urgent questions, you may also contact your provider using MyChart. We now offer e-Visits for anyone 35 and older to request care online for non-urgent symptoms. For details visit [email or web address].   Also download the MyChart app! Go to the app store, search "MyChart", open the app, select [place], and log in with your MyChart username and password.
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
## 2024-04-17 ENCOUNTER — Encounter: Payer: Self-pay | Admitting: Hematology and Oncology

## 2024-04-17 ENCOUNTER — Encounter: Payer: Self-pay | Admitting: Oncology

## 2024-04-17 ENCOUNTER — Other Ambulatory Visit: Payer: Self-pay

## 2024-04-17 NOTE — Assessment & Plan Note (Addendum)
 [name] is a 73 year old woman with stage IV breast cancer on treatment with Trastuzumab .   Assessment and Plan Assessment & Plan Metastatic HER2-positive breast cancer Recent scans showed no progression. Herceptin  therapy was paused during illness but is now safe to restart. She understood the importance of Herceptin  and agreed to resume therapy. Most recent echo in June of 2025 shows a well preserved EF.  She will continue to follow with cardiology for her echocardiograms.  - Restart Herceptin  therapy every 28 days. - Check in for infusion today.  Anemia secondary to chronic disease and recent infection Anemia likely secondary to chronic disease and recent infection. Hemoglobin improved from 7.5 g/dL to 9 g/dL. Reports occasional weakness, possibly related to low hemoglobin. Iron levels adequate, nutritional status improving as indicated by better albumin  levels.  Deconditioning and impaired mobility Reports difficulty with mobility, particularly with left foot. Undergoing daily physical therapy with gradual progress. Deconditioning likely due to recent hospitalization and illness.  Recent soft tissue abscess, resolved Recent soft tissue abscess was drained and resolved. No current wound present.  Atrial fibrillation on chronic anticoagulation On chronic anticoagulation with Eliquis . Discussed affordability and possibility of switching to Pradaxa if needed for cost reasons. Facility's case manager will handle medication payment issues. - Continue Eliquis . - Consider switching to Pradaxa if needed for cost reasons; facility to contact if switch is necessary.  Hypertension On multiple antihypertensive medications including amlodipine , carvedilol , losartan , and spironolactone . Discussed affordability of medications.

## 2024-04-20 DIAGNOSIS — Z5181 Encounter for therapeutic drug level monitoring: Secondary | ICD-10-CM | POA: Diagnosis not present

## 2024-04-20 DIAGNOSIS — M25569 Pain in unspecified knee: Secondary | ICD-10-CM | POA: Diagnosis not present

## 2024-04-21 ENCOUNTER — Ambulatory Visit (INDEPENDENT_AMBULATORY_CARE_PROVIDER_SITE_OTHER): Admitting: Infectious Diseases

## 2024-04-21 ENCOUNTER — Other Ambulatory Visit: Payer: Self-pay

## 2024-04-21 VITALS — BP 136/87 | HR 86 | Temp 97.9°F

## 2024-04-21 DIAGNOSIS — M869 Osteomyelitis, unspecified: Secondary | ICD-10-CM | POA: Diagnosis not present

## 2024-04-21 DIAGNOSIS — M4658 Other infective spondylopathies, sacral and sacrococcygeal region: Secondary | ICD-10-CM | POA: Diagnosis not present

## 2024-04-21 DIAGNOSIS — Z95828 Presence of other vascular implants and grafts: Secondary | ICD-10-CM

## 2024-04-21 DIAGNOSIS — R21 Rash and other nonspecific skin eruption: Secondary | ICD-10-CM

## 2024-04-21 NOTE — Progress Notes (Unsigned)
 Patient Active Problem List   Diagnosis Date Noted   Narrow complex tachycardia (HCC) 03/23/2024   Lactic acidosis 03/22/2024   AKI (acute kidney injury) (HCC) 03/22/2024   GAD (generalized anxiety disorder) 03/22/2024   Hyponatremia 03/22/2024   Acute metabolic encephalopathy 03/21/2024   Rash 03/20/2024   Medication management 03/20/2024   PICC (peripherally inserted central catheter) in place 03/20/2024   Septic arthritis of sacroiliac joint (HCC) 03/20/2024   Intractable nausea and vomiting 03/13/2024   Esophagitis 03/13/2024   Urinary frequency 03/13/2024   Diarrhea 03/13/2024   SIRS (systemic inflammatory response syndrome) (HCC) 03/13/2024   HLD (hyperlipidemia) 02/18/2024   Bacteremia due to group B Streptococcus 02/17/2024   Septic arthritis (HCC) 02/14/2024   Osteomyelitis (HCC) 02/14/2024   Abscess of left iliac fossa 02/13/2024   Hypomagnesemia 01/20/2024   Hematemesis 01/19/2024   History of pulmonary embolism 01/19/2024   Chronic anticoagulation 01/19/2024   DNR (do not resuscitate).DNI(Do Not Intubate) 01/19/2024   Hypokalemia 01/19/2024   Acute metabolic acidosis 01/19/2024   Goals of care, counseling/discussion 08/13/2019   Port-A-Cath in place 08/14/2018   Obesity, Class III, BMI 40-49.9 (morbid obesity) 09/22/2015   Malignant neoplasm of both breasts in female, estrogen receptor negative (HCC) 09/22/2015   Coagulopathy (HCC) 09/22/2015   Peripheral neuropathy 09/22/2015   Hot flashes 03/24/2015   Right shoulder pain 06/12/2013   Neck pain 06/12/2013   Anemia of chronic disease 03/21/2013   Breast cancer metastasized to multiple sites (HCC) 02/26/2013   Chronic diastolic CHF (congestive heart failure) (HCC) 09/11/2012   Neuropathy 09/05/2012   Essential hypertension 09/05/2012   Anxiety 09/05/2012    Patient's Medications  New Prescriptions   No medications on file  Previous Medications   ACETAMINOPHEN  (TYLENOL ) 500 MG TABLET    Take  500-1,000 mg by mouth every 6 (six) hours as needed for mild pain (pain score 1-3), fever or moderate pain (pain score 4-6).   ALBUTEROL  (VENTOLIN  HFA) 108 (90 BASE) MCG/ACT INHALER    Inhale 2 puffs into the lungs every 6 (six) hours as needed for wheezing.   ALPRAZOLAM  (XANAX ) 1 MG TABLET    Take 1 tablet (1 mg total) by mouth 3 (three) times daily as needed for anxiety.   AMIODARONE  (PACERONE ) 200 MG TABLET    Take 1 tablet (200 mg total) by mouth 2 (two) times daily.   APIXABAN  (ELIQUIS ) 5 MG TABS TABLET    Take 1 tablet (5 mg total) by mouth 2 (two) times daily.   ATORVASTATIN (LIPITOR) 20 MG TABLET    Take 20 mg by mouth daily.   CALCIUM  PO    Take 2 tablets by mouth daily.   CYCLOBENZAPRINE  (FLEXERIL ) 5 MG TABLET    Take 1 tablet (5 mg total) by mouth 2 (two) times daily as needed for muscle spasms.   DULOXETINE  (CYMBALTA ) 60 MG CAPSULE    Take 60 mg by mouth daily.   FUROSEMIDE  (LASIX ) 40 MG TABLET    Take 40 mg by mouth as needed for fluid or edema.   GABAPENTIN  (NEURONTIN ) 300 MG CAPSULE    TAKE 2 CAPSULES BY MOUTH THREE TIMES DAILY   OMEPRAZOLE (PRILOSEC) 20 MG CAPSULE    Take 20 mg by mouth daily.   ONDANSETRON  (ZOFRAN ) 8 MG TABLET    Take 1 tablet (8 mg total) by mouth every 8 (eight) hours as needed for nausea or vomiting.   OXYCODONE  (OXY IR/ROXICODONE ) 5 MG IMMEDIATE RELEASE TABLET    Take  1 tablet (5 mg total) by mouth every 4 (four) hours as needed for moderate pain (pain score 4-6) or severe pain (pain score 7-10).   PANTOPRAZOLE  (PROTONIX ) 40 MG TABLET    Take 1 tablet (40 mg total) by mouth daily.   POTASSIUM CHLORIDE  SA (KLOR-CON  M) 20 MEQ TABLET    Take 2 tablets (40 mEq total) by mouth daily. Take 2 tablets twice a day for 2 days and after that 2 tablets once a day   ROSUVASTATIN  (CRESTOR ) 10 MG TABLET    Take 1 tablet (10 mg total) by mouth daily.   TEMAZEPAM  (RESTORIL ) 30 MG CAPSULE    Take 1 capsule (30 mg total) by mouth at bedtime as needed for sleep.  Modified  Medications   No medications on file  Discontinued Medications   No medications on file    Subjective: Discussed the use of AI scribe software for clinical note transcription with the patient, who gave verbal consent to proceed.   Addendum: 73 year old female with prior history of metastatic breast cancer on Herceptin  maintenance treatment, SVC syndrome, CHF, HTN, PE on AC, anxiety, recent strep bacteremia/left SI joint septic arthritis who is here for HFU after recent admission 7/18-7/22 for possible acute gastroenteritis   Readmitted 7/18-7/22 for nausea, vomiting, abdominal pain, decreased po intake and diarrhea. This was thought to be be acute gastroenteritis. C diff and GI PCR negative. She was discharged on 7/22 after clinical improvement. She had encephalopathy during admission that also improved with negative work up including negative MRI brain.   CT showed  1. Diffuse circumferential wall thickening of the esophagus, consistent with nonspecific infectious or inflammatory esophagitis. 2. New trace right pleural effusion. 3. Interval increase in bony resorption and sclerosis about the left sacroiliac joint, concerning for ongoing septic arthritis. 4. Status post bilateral mastectomy and bilateral axillary lymph node dissection. No evidence of lymphadenopathy or metastatic disease in the chest, abdomen, or pelvis.  Seen by ID and imaging findings were thought to be lag of radiographic findings and recommended to continue original plan of daptomycin  and ceftriaxone  through 8/4.   7/25 She is accompanied by her grandson. She reports having severe itching and rashes on her extremities, upper extremities, rt lower leg, back four weeks ago  while on antibiotics. She has a known allergy to penicillin, which previously caused similar symptoms. Denies lip and facial swelling currently. She has not used any antihistamines. She also denies reporting the reaction to anyone until yesterday. I  recommended to discontinue ceftriaxone  yesterday. She also reports experiencing diarrhea and nausea after starting on IV antibiotics with one episode of vomiting. However, she currently has no abdominal pain or diarrhea or N/V.   8/26 With daughter She was hospitalized for a left hip infection and completed a course of antibiotics at a rehabilitation facility. There is no current pain in the left hip. Since late July, she has experienced difficulty walking and weakness on her left side, including her arm and leg. She has not walked independently for over a month and is undergoing daily physical therapy, showing some improvement. Swelling is present in her left arm and leg, particularly in the ankles. She experiences skin peeling and itching, previously attributed to an antibiotic reaction. She is no longer on antibiotics. She cannot stand without support and requires assistance to walk with bars.  Review of Systems: all systems reviewed with pertinent positives and negatives as listed above   Past Medical History:  Diagnosis Date   Anemia  pt pt report   Anxiety    Bacteremia due to group B Streptococcus    June '25   Breast cancer (HCC)    mets to liver and lung   Breast cancer metastasized to multiple sites [facility]) 02/26/2013   Cellulitis    CHF (congestive heart failure) (HCC)    History of chemotherapy 09/2004   taxotere/herceptin /carboplatin   Hypertension    Neuropathy    Osteomyelitis of sacrum (HCC)    Radiation 07/31/2006   left upper chest   Radiation 06/17/2006-06/27/2006   6480 cGy bilat. chest wall   Septic arthritis (HCC)    left SI joint   SVC syndrome    Thrombosis    Past Surgical History:  Procedure Laterality Date   ANKLE SURGERY Left    BACK SURGERY     CHOLECYSTECTOMY  08/28/1987   ESOPHAGOGASTRODUODENOSCOPY Left 01/21/2024   Procedure: EGD (ESOPHAGOGASTRODUODENOSCOPY);  Surgeon: Burnette Fallow, MD;  Location: [facility];  Service: Gastroenterology;   Laterality: Left;   MASS EXCISION Left 05/10/2022   Procedure: EXCISION OF LEFT CHEST WALL MASS;  Surgeon: Vanderbilt Ned, MD;  Location: [facility] OR;  Service: General;  Laterality: Left;   MASTECTOMY Bilateral    w/ lymph node removal per patient   PERIPHERALLY INSERTED CENTRAL CATHETER INSERTION     PICC LINE INSERTION     PICC LINE REMOVAL ([facility] HX)     SURGICAL EXCISION OF EXCESSIVE SKIN     TUBAL LIGATION  08/27/1984    Social History   Tobacco Use   Smoking status: Never    Passive exposure: Never   Smokeless tobacco: Never  Vaping Use   Vaping status: Never Used  Substance Use Topics   Alcohol  use: Yes    Comment: occasional- once a month   Drug use: No    Family History  Problem Relation Age of Onset   Heart failure Father    Cancer Father        Prostate cancer   Heart failure Brother    Cancer Brother        Prostate cancer   Diabetes Maternal Aunt     Allergies  Allergen Reactions   Penicillins Hives and Other (See Comments)    PATIENT HAS TOLERATED Cephalosporins   Adhesive [Tape] Other (See Comments)    Tears the skin    Ceftriaxone  Rash    Concern for drug rash noted 03/20/24   Daptomycin  Rash    Vague concern for drug rash as patient was on ceftriaxone  and daptomycin  at the same time    Health Maintenance  Topic Date Due   Diabetic kidney evaluation - Urine ACR  Never done   Hepatitis C Screening  Never done   DTaP/Tdap/Td (1 - Tdap) Never done   Zoster Vaccines- Shingrix (1 of 2) Never done   Colonoscopy  Never done   MAMMOGRAM  10/21/2006   Pneumococcal Vaccine: 50+ Years (2 of 2 - PPSV23, PCV20, or PCV21) 04/03/2017   COVID-19 Vaccine (4 - 2024-25 season) 04/28/2023   Medicare Annual Wellness (AWV)  04/17/2024   INFLUENZA VACCINE  03/27/2024   Diabetic kidney evaluation - eGFR measurement  04/14/2025   DEXA SCAN  Completed   HPV VACCINES  Aged Out   Meningococcal B Vaccine  Aged Out    Objective: There were no vitals taken for this  visit.    Physical Exam Constitutional:      Appearance: Normal appearance.  HENT:     Head: Normocephalic and atraumatic.  Mouth: Mucous membranes are moist.  Eyes:    Conjunctiva/sclera: Conjunctivae normal.     Pupils: Pupils are equal, round, and b/l symmetrical    Cardiovascular:     Rate and Rhythm: Normal rate and regular rhythm.     Heart sounds:   Pulmonary:     Effort: Pulmonary effort is normal.     Breath sounds:   Abdominal:     General: Non distended     Palpations: soft.   Musculoskeletal:        General: sitting in the wheel chair  Skin:    General: Skin is warm and dry. Rt chest portacath OK with no signs of infection     Comments: maculopapular rashes in the b/l forearms, rt leg, rashes in the ? Back seems to have resolved. No facial or lip swelling or SOB. No rashes at other sites       Neurological:     General: grossly non focal     Mental Status: awake, alert and oriented to person, place, and time.   Psychiatric:        Mood and Affect: Mood normal.   Lab Results Lab Results  Component Value Date   WBC 6.9 04/14/2024   HGB 9.0 (L) 04/14/2024   HCT 24.7 (L) 04/14/2024   MCV 74.8 (L) 04/14/2024   PLT 320 04/14/2024    Lab Results  Component Value Date   CREATININE 0.55 04/14/2024   BUN 8 04/14/2024   NA 137 04/14/2024   K 4.3 04/14/2024   CL 105 04/14/2024   CO2 27 04/14/2024    Lab Results  Component Value Date   ALT 10 04/14/2024   AST 13 (L) 04/14/2024   ALKPHOS 90 04/14/2024   BILITOT 0.6 04/14/2024    Lab Results  Component Value Date   CHOL 235 (H) 11/12/2023   HDL 90 11/12/2023   LDLCALC 134 (H) 11/12/2023   TRIG 55 11/12/2023   CHOLHDL 2.6 11/12/2023   No results found for: LABRPR, RPRTITER HIV 1 RNA Quant (copies/mL)  Date Value  06/19/2014 <20   Microbiology Results for orders placed or performed during the hospital encounter of 03/12/24  Blood culture (routine x 2)     Status: None    Collection Time: 03/12/24  4:50 PM   Specimen: BLOOD  Result Value Ref Range Status   Specimen Description BLOOD RIGHT ANTECUBITAL  Final   Special Requests   Final    BOTTLES DRAWN AEROBIC AND ANAEROBIC Blood Culture results may not be optimal due to an inadequate volume of blood received in culture bottles   Culture   Final    NO GROWTH 5 DAYS Performed at [facility], [address], [place], [place] [address]    Report Status 03/17/2024 FINAL  Final  Blood culture (routine x 2)     Status: None   Collection Time: 03/12/24  4:55 PM   Specimen: BLOOD  Result Value Ref Range Status   Specimen Description BLOOD LEFT ANTECUBITAL  Final   Special Requests   Final    BOTTLES DRAWN AEROBIC AND ANAEROBIC Blood Culture results may not be optimal due to an inadequate volume of blood received in culture bottles   Culture   Final    NO GROWTH 5 DAYS Performed at [facility], [address], [place], [place] [address]    Report Status 03/17/2024 FINAL  Final  C Difficile Quick Screen w PCR reflex  Status: None   Collection Time: 03/13/24 12:05 PM   Specimen: STOOL  Result Value Ref Range Status   C Diff antigen NEGATIVE NEGATIVE Final   C Diff toxin NEGATIVE NEGATIVE Final   C Diff interpretation No C. difficile detected.  Final    Comment: Performed at [facility], [address] [place], [place] [address]  Gastrointestinal Panel by PCR , Stool     Status: None   Collection Time: 03/13/24 12:05 PM   Specimen: STOOL  Result Value Ref Range Status   Campylobacter species NOT DETECTED NOT DETECTED Final   Plesimonas shigelloides NOT DETECTED NOT DETECTED Final   Salmonella species NOT DETECTED NOT DETECTED Final   Yersinia enterocolitica NOT DETECTED NOT DETECTED Final   Vibrio species NOT DETECTED NOT DETECTED Final   Vibrio cholerae NOT DETECTED NOT DETECTED Final   Enteroaggregative E coli (EAEC) NOT DETECTED NOT DETECTED Final   Enteropathogenic E coli  (EPEC) NOT DETECTED NOT DETECTED Final   Enterotoxigenic E coli (ETEC) NOT DETECTED NOT DETECTED Final   Shiga like toxin producing E coli (STEC) NOT DETECTED NOT DETECTED Final   Shigella/Enteroinvasive E coli (EIEC) NOT DETECTED NOT DETECTED Final   Cryptosporidium NOT DETECTED NOT DETECTED Final   Cyclospora cayetanensis NOT DETECTED NOT DETECTED Final   Entamoeba histolytica NOT DETECTED NOT DETECTED Final   Giardia lamblia NOT DETECTED NOT DETECTED Final   Adenovirus F40/41 NOT DETECTED NOT DETECTED Final   Astrovirus NOT DETECTED NOT DETECTED Final   Norovirus GI/GII NOT DETECTED NOT DETECTED Final   Rotavirus A NOT DETECTED NOT DETECTED Final   Sapovirus (I, II, IV, and V) NOT DETECTED NOT DETECTED Final    Comment: Performed at [facility], [address] [place], [place] [address]   *Note: Due to a large number of results and/or encounters for the requested time period, some results have not been displayed. A complete set of results can be found in Results Review.   Imaging EEG adult Result Date: 03/22/2024 Gregg Lek, MD     03/22/2024  6:40 PM Patient Name: [name] MRN: [id number] Epilepsy Attending: Lek Gregg Referring Physician/Provider: Date: 03/22/2024 Duration: 22 ,minutes Patient history: 73 year old man with altered mental status, EEG to assess for seizure Level of alertness: Awake, drowsy AEDs during EEG study: None Technical aspects: This EEG study was done with scalp electrodes positioned according to the 10-20 International system of electrode placement. Electrical activity was reviewed with band pass filter of 1-70Hz , sensitivity of 7 uV/mm, display speed of 40mm/sec with a 60Hz  notched filter applied as appropriate. EEG data were recorded continuously and digitally stored.  Video monitoring was available and reviewed as appropriate. Description: The posterior dominant rhythm consists of 6-8 Hz activity of moderate voltage (25-35 uV) seen  predominantly in posterior head regions, symmetric and reactive to eye opening and eye closing. Drowsiness was characterized by attenuation of the posterior background rhythm. Hyperventilation and photic stimulation were not performed.   ABNORMALITY - Continuous slow, generalized IMPRESSION: This study is suggestive of mild diffuse encephalopathy, nonspecific etiology but likely related to sedation, toxic-metabolic etiology. No seizures or epileptiform discharges were seen throughout the recording. Lek Gregg MD Neurology    Assessment/Plan 73 year old female with prior history of metastatic breast cancer on Herceptin  every 28 days as maintenance, SVC syndrome, CHF, HTN, PE on AC, anxiety admitted with   # Left SI joint septic arthritis and osteomyelitis with associated fluid collection in the left iliac us ,  possible abscess - No risk factors like history of surgeries steroid injections or IVDU - Orthopedics consulted and no plans for intervention.   - 6/20 s/p IR aspiration, no organisms in gram stain, NG.   Plan  - continue daptomycin , DC ceftriaxone , start ertapenem  ( see new OPAT) - 7/22 CBC and CMO reviewed discussed. 7/18 CRP 5.3, ESR 45   - fu scheduled on 8/5  # Group B strep 1/4 bottles - blood cx cleared on 7/17 - 6/24 TTE with no vegetations  - antibiotic as above   # Possible reaction to ceftriaxone /Drug rash  - Will DC ceftriaxone  - continue daptomycin , will add ertapenem  ( see new OPAT) - discussed to use benadryl  as needed   I spent 40 minutes involved in face-to-face and non-face-to-face activities for this patient on the day of the visit. Professional time spent includes the following activities: Preparing to see the patient (review of tests), Obtaining and reviewing separately obtained history (discharge record 7/22, notes from ID Dr Dennise), Performing a medically appropriate examination and evaluation , Ordering medications, communicating with other health care  professionals [facility] staff, Documenting clinical information in the EMR, Independently interpreting results (not separately reported), Communicating results to the patient, Counseling and educating the patient and Care coordination (not separately reported).   Of note, portions of this note may have been created with voice recognition software. While this note has been edited for accuracy, occasional wrong-word or 'sound-a-like' substitutions may have occurred due to the inherent limitations of voice recognition software.   Annalee Joseph, MD [facility] 04/21/2024, 2:16 PM

## 2024-04-22 LAB — C-REACTIVE PROTEIN: CRP: 19.7 mg/L — ABNORMAL HIGH (ref <8.0)

## 2024-04-22 LAB — SEDIMENTATION RATE

## 2024-04-22 LAB — SED RATE MANUAL WEST RFLX: SED RATE BY MODIFIED WESTERGREN,MANUAL: 27 mm/h (ref 0–30)

## 2024-04-23 ENCOUNTER — Ambulatory Visit: Payer: Self-pay | Admitting: Infectious Diseases

## 2024-04-25 ENCOUNTER — Other Ambulatory Visit: Payer: Self-pay

## 2024-04-26 ENCOUNTER — Encounter (HOSPITAL_COMMUNITY): Payer: Self-pay

## 2024-04-26 DIAGNOSIS — I10 Essential (primary) hypertension: Secondary | ICD-10-CM | POA: Diagnosis not present

## 2024-04-26 DIAGNOSIS — Z79899 Other long term (current) drug therapy: Secondary | ICD-10-CM | POA: Diagnosis not present

## 2024-04-26 DIAGNOSIS — Z86711 Personal history of pulmonary embolism: Secondary | ICD-10-CM | POA: Diagnosis not present

## 2024-04-26 DIAGNOSIS — S12101A Unspecified nondisplaced fracture of second cervical vertebra, initial encounter for closed fracture: Secondary | ICD-10-CM | POA: Diagnosis not present

## 2024-04-26 DIAGNOSIS — M25532 Pain in left wrist: Secondary | ICD-10-CM | POA: Diagnosis not present

## 2024-04-26 DIAGNOSIS — S0003XA Contusion of scalp, initial encounter: Secondary | ICD-10-CM | POA: Diagnosis not present

## 2024-04-26 DIAGNOSIS — C50919 Malignant neoplasm of unspecified site of unspecified female breast: Secondary | ICD-10-CM | POA: Diagnosis not present

## 2024-04-26 DIAGNOSIS — C799 Secondary malignant neoplasm of unspecified site: Secondary | ICD-10-CM | POA: Diagnosis not present

## 2024-04-26 DIAGNOSIS — W06XXXA Fall from bed, initial encounter: Secondary | ICD-10-CM | POA: Diagnosis not present

## 2024-04-26 DIAGNOSIS — Z7901 Long term (current) use of anticoagulants: Secondary | ICD-10-CM | POA: Diagnosis not present

## 2024-04-26 DIAGNOSIS — Y92122 Bedroom in nursing home as the place of occurrence of the external cause: Secondary | ICD-10-CM | POA: Diagnosis not present

## 2024-04-27 DIAGNOSIS — S12112D Nondisplaced Type II dens fracture, subsequent encounter for fracture with routine healing: Secondary | ICD-10-CM | POA: Diagnosis not present

## 2024-04-27 DIAGNOSIS — M542 Cervicalgia: Secondary | ICD-10-CM | POA: Diagnosis not present

## 2024-04-27 DIAGNOSIS — G4700 Insomnia, unspecified: Secondary | ICD-10-CM | POA: Diagnosis not present

## 2024-04-27 DIAGNOSIS — Z171 Estrogen receptor negative status [ER-]: Secondary | ICD-10-CM | POA: Diagnosis not present

## 2024-04-27 DIAGNOSIS — Z7409 Other reduced mobility: Secondary | ICD-10-CM | POA: Diagnosis present

## 2024-04-27 DIAGNOSIS — S12112A Nondisplaced Type II dens fracture, initial encounter for closed fracture: Secondary | ICD-10-CM | POA: Diagnosis not present

## 2024-04-27 DIAGNOSIS — D509 Iron deficiency anemia, unspecified: Secondary | ICD-10-CM | POA: Diagnosis not present

## 2024-04-27 DIAGNOSIS — M0088 Arthritis due to other bacteria, vertebrae: Secondary | ICD-10-CM | POA: Diagnosis present

## 2024-04-27 DIAGNOSIS — C7800 Secondary malignant neoplasm of unspecified lung: Secondary | ICD-10-CM | POA: Diagnosis not present

## 2024-04-27 DIAGNOSIS — M25561 Pain in right knee: Secondary | ICD-10-CM | POA: Diagnosis not present

## 2024-04-27 DIAGNOSIS — M1711 Unilateral primary osteoarthritis, right knee: Secondary | ICD-10-CM | POA: Diagnosis not present

## 2024-04-27 DIAGNOSIS — E441 Mild protein-calorie malnutrition: Secondary | ICD-10-CM | POA: Diagnosis present

## 2024-04-27 DIAGNOSIS — C50812 Malignant neoplasm of overlapping sites of left female breast: Secondary | ICD-10-CM | POA: Diagnosis present

## 2024-04-27 DIAGNOSIS — C50919 Malignant neoplasm of unspecified site of unspecified female breast: Secondary | ICD-10-CM | POA: Diagnosis not present

## 2024-04-27 DIAGNOSIS — Z5112 Encounter for antineoplastic immunotherapy: Secondary | ICD-10-CM | POA: Diagnosis present

## 2024-04-27 DIAGNOSIS — M6281 Muscle weakness (generalized): Secondary | ICD-10-CM | POA: Diagnosis present

## 2024-04-27 DIAGNOSIS — M4628 Osteomyelitis of vertebra, sacral and sacrococcygeal region: Secondary | ICD-10-CM | POA: Diagnosis present

## 2024-04-27 DIAGNOSIS — I5030 Unspecified diastolic (congestive) heart failure: Secondary | ICD-10-CM | POA: Diagnosis present

## 2024-04-27 DIAGNOSIS — C7951 Secondary malignant neoplasm of bone: Secondary | ICD-10-CM | POA: Diagnosis not present

## 2024-04-27 DIAGNOSIS — I871 Compression of vein: Secondary | ICD-10-CM | POA: Diagnosis not present

## 2024-04-27 DIAGNOSIS — F411 Generalized anxiety disorder: Secondary | ICD-10-CM | POA: Diagnosis present

## 2024-04-27 DIAGNOSIS — R488 Other symbolic dysfunctions: Secondary | ICD-10-CM | POA: Diagnosis present

## 2024-04-27 DIAGNOSIS — Z741 Need for assistance with personal care: Secondary | ICD-10-CM | POA: Diagnosis present

## 2024-04-27 DIAGNOSIS — R2681 Unsteadiness on feet: Secondary | ICD-10-CM | POA: Diagnosis present

## 2024-04-27 DIAGNOSIS — R600 Localized edema: Secondary | ICD-10-CM | POA: Diagnosis not present

## 2024-04-27 DIAGNOSIS — I4719 Other supraventricular tachycardia: Secondary | ICD-10-CM | POA: Diagnosis not present

## 2024-04-27 DIAGNOSIS — E669 Obesity, unspecified: Secondary | ICD-10-CM | POA: Diagnosis not present

## 2024-04-27 DIAGNOSIS — Z7901 Long term (current) use of anticoagulants: Secondary | ICD-10-CM | POA: Diagnosis not present

## 2024-04-27 DIAGNOSIS — C50911 Malignant neoplasm of unspecified site of right female breast: Secondary | ICD-10-CM | POA: Diagnosis present

## 2024-04-28 DIAGNOSIS — R600 Localized edema: Secondary | ICD-10-CM | POA: Diagnosis not present

## 2024-04-30 DIAGNOSIS — I4719 Other supraventricular tachycardia: Secondary | ICD-10-CM | POA: Diagnosis not present

## 2024-05-04 DIAGNOSIS — S12112A Nondisplaced Type II dens fracture, initial encounter for closed fracture: Secondary | ICD-10-CM | POA: Diagnosis not present

## 2024-05-04 DIAGNOSIS — M542 Cervicalgia: Secondary | ICD-10-CM | POA: Diagnosis not present

## 2024-05-04 NOTE — Addendum Note (Signed)
 Encounter addended by: Debarah Garrison MATSU, RN on: 05/04/2024 3:16 PM  Actions taken: Imaging Exam ended

## 2024-05-05 DIAGNOSIS — F411 Generalized anxiety disorder: Secondary | ICD-10-CM | POA: Diagnosis not present

## 2024-05-05 DIAGNOSIS — M0088 Arthritis due to other bacteria, vertebrae: Secondary | ICD-10-CM | POA: Diagnosis not present

## 2024-05-05 DIAGNOSIS — M4628 Osteomyelitis of vertebra, sacral and sacrococcygeal region: Secondary | ICD-10-CM | POA: Diagnosis not present

## 2024-05-05 DIAGNOSIS — I5030 Unspecified diastolic (congestive) heart failure: Secondary | ICD-10-CM | POA: Diagnosis not present

## 2024-05-10 ENCOUNTER — Ambulatory Visit (HOSPITAL_COMMUNITY): Payer: Self-pay | Admitting: Cardiology

## 2024-05-11 NOTE — Telephone Encounter (Signed)
 Pt aware and voiced understanding

## 2024-05-13 ENCOUNTER — Inpatient Hospital Stay

## 2024-05-13 ENCOUNTER — Inpatient Hospital Stay: Attending: Oncology | Admitting: Hematology and Oncology

## 2024-05-13 ENCOUNTER — Other Ambulatory Visit: Payer: Self-pay | Admitting: Physician Assistant

## 2024-05-13 ENCOUNTER — Encounter: Payer: Self-pay | Admitting: *Deleted

## 2024-05-13 ENCOUNTER — Telehealth: Payer: Self-pay | Admitting: Pharmacy Technician

## 2024-05-13 VITALS — BP 134/83 | HR 79 | Temp 98.2°F | Resp 18 | Ht 63.0 in

## 2024-05-13 DIAGNOSIS — D509 Iron deficiency anemia, unspecified: Secondary | ICD-10-CM | POA: Insufficient documentation

## 2024-05-13 DIAGNOSIS — C50911 Malignant neoplasm of unspecified site of right female breast: Secondary | ICD-10-CM

## 2024-05-13 DIAGNOSIS — Z171 Estrogen receptor negative status [ER-]: Secondary | ICD-10-CM | POA: Insufficient documentation

## 2024-05-13 DIAGNOSIS — D508 Other iron deficiency anemias: Secondary | ICD-10-CM

## 2024-05-13 DIAGNOSIS — C50812 Malignant neoplasm of overlapping sites of left female breast: Secondary | ICD-10-CM | POA: Diagnosis not present

## 2024-05-13 DIAGNOSIS — I871 Compression of vein: Secondary | ICD-10-CM | POA: Diagnosis not present

## 2024-05-13 DIAGNOSIS — Z7901 Long term (current) use of anticoagulants: Secondary | ICD-10-CM | POA: Diagnosis not present

## 2024-05-13 DIAGNOSIS — C7800 Secondary malignant neoplasm of unspecified lung: Secondary | ICD-10-CM | POA: Diagnosis not present

## 2024-05-13 DIAGNOSIS — C50919 Malignant neoplasm of unspecified site of unspecified female breast: Secondary | ICD-10-CM

## 2024-05-13 DIAGNOSIS — C50011 Malignant neoplasm of nipple and areola, right female breast: Secondary | ICD-10-CM

## 2024-05-13 DIAGNOSIS — C7951 Secondary malignant neoplasm of bone: Secondary | ICD-10-CM | POA: Diagnosis not present

## 2024-05-13 DIAGNOSIS — Z5112 Encounter for antineoplastic immunotherapy: Secondary | ICD-10-CM | POA: Diagnosis not present

## 2024-05-13 DIAGNOSIS — Z95828 Presence of other vascular implants and grafts: Secondary | ICD-10-CM

## 2024-05-13 LAB — CMP (CANCER CENTER ONLY)
ALT: 8 U/L (ref 0–44)
AST: 12 U/L — ABNORMAL LOW (ref 15–41)
Albumin: 3 g/dL — ABNORMAL LOW (ref 3.5–5.0)
Alkaline Phosphatase: 74 U/L (ref 38–126)
Anion gap: 4 — ABNORMAL LOW (ref 5–15)
BUN: 14 mg/dL (ref 8–23)
CO2: 30 mmol/L (ref 22–32)
Calcium: 8.5 mg/dL — ABNORMAL LOW (ref 8.9–10.3)
Chloride: 109 mmol/L (ref 98–111)
Creatinine: 0.8 mg/dL (ref 0.44–1.00)
GFR, Estimated: >60 mL/min (ref >60)
Glucose, Bld: 95 mg/dL (ref 70–99)
Potassium: 4.5 mmol/L (ref 3.5–5.1)
Sodium: 143 mmol/L (ref 135–145)
Total Bilirubin: 0.4 mg/dL (ref 0.0–1.2)
Total Protein: 7.2 g/dL (ref 6.5–8.1)

## 2024-05-13 LAB — CBC WITH DIFFERENTIAL (CANCER CENTER ONLY)
Abs Immature Granulocytes: 0.01 K/uL (ref 0.00–0.07)
Basophils Absolute: 0.1 K/uL (ref 0.0–0.1)
Basophils Relative: 1 %
Eosinophils Absolute: 0.5 K/uL (ref 0.0–0.5)
Eosinophils Relative: 6 %
HCT: 26.4 % — ABNORMAL LOW (ref 36.0–46.0)
Hemoglobin: 9.2 g/dL — ABNORMAL LOW (ref 12.0–15.0)
Immature Granulocytes: 0 %
Lymphocytes Relative: 29 %
Lymphs Abs: 2.2 K/uL (ref 0.7–4.0)
MCH: 28.1 pg (ref 26.0–34.0)
MCHC: 34.8 g/dL (ref 30.0–36.0)
MCV: 80.7 fL (ref 80.0–100.0)
Monocytes Absolute: 0.8 K/uL (ref 0.1–1.0)
Monocytes Relative: 10 %
Neutro Abs: 4.1 K/uL (ref 1.7–7.7)
Neutrophils Relative %: 54 %
Platelet Count: 435 K/uL — ABNORMAL HIGH (ref 150–400)
RBC: 3.27 MIL/uL — ABNORMAL LOW (ref 3.87–5.11)
RDW: 16.9 % — ABNORMAL HIGH (ref 11.5–15.5)
WBC Count: 7.6 K/uL (ref 4.0–10.5)
nRBC: 0 % (ref 0.0–0.2)

## 2024-05-13 LAB — SAMPLE TO BLOOD BANK

## 2024-05-13 MED ORDER — IRON SUCROSE 300 MG IVPB - SIMPLE MED
300.0000 mg | Freq: Once | Status: AC
  Administered 2024-05-13: 300 mg via INTRAVENOUS
  Filled 2024-05-13: qty 300

## 2024-05-13 MED ORDER — LORAZEPAM 2 MG/ML IJ SOLN
1.0000 mg | Freq: Once | INTRAMUSCULAR | Status: AC
  Administered 2024-05-13: 1 mg via INTRAVENOUS
  Filled 2024-05-13: qty 1

## 2024-05-13 MED ORDER — TRASTUZUMAB-DTTB CHEMO 150 MG IV SOLR
750.0000 mg | Freq: Once | INTRAVENOUS | Status: AC
  Administered 2024-05-13: 750 mg via INTRAVENOUS
  Filled 2024-05-13: qty 35.71

## 2024-05-13 MED ORDER — ACETAMINOPHEN 325 MG PO TABS
650.0000 mg | ORAL_TABLET | Freq: Once | ORAL | Status: AC
  Administered 2024-05-13: 650 mg via ORAL
  Filled 2024-05-13: qty 2

## 2024-05-13 MED ORDER — SODIUM CHLORIDE 0.9 % IV SOLN: Freq: Once | INTRAVENOUS | Status: AC

## 2024-05-13 NOTE — Progress Notes (Signed)
 Patient Care Team: Jefferey Fitch, MD as PCP - General (Internal Medicine) Glynda Franky NOVAK., MD as Consulting Physician (Pain Medicine) Larnell Purchase, MD as Referring Physician (Orthopedic Surgery) Vola Duncans, DO as Consulting Physician (Orthopedic Surgery) Rolan Ezra RAMAN, MD as Consulting Physician (Cardiology) Bensimhon, Toribio SAUNDERS, MD as Consulting Physician (Cardiology) Ardeen Rollene Kass, MD (Radiology) Fleeta Rothman, Jomarie SAILOR, MD as Consulting Physician (Infectious Diseases) Shannon Agent, MD as Consulting Physician (Radiation Oncology) Odean Potts, MD as Consulting Physician (Hematology and Oncology) Crawford Morna Pickle, NP as Nurse Practitioner (Hematology and Oncology)  DIAGNOSIS:  Encounter Diagnosis  Name Primary?   Carcinoma of breast metastatic to multiple sites, unspecified laterality (HCC) Yes    SUMMARY OF ONCOLOGIC HISTORY: Oncology History  Breast cancer metastasized to multiple sites [facility])  09/05/2012 - 09/27/2022 Chemotherapy   Patient is on Treatment Plan : BREAST Trastuzumab  q28d     02/26/2013 Initial Diagnosis   history of inflammatory right breast cancer metastatic at presentation September 2004 with involvement of liver and bone, HER-2 positive, estrogen and progesterone receptor negative      - 11/2013 Chemotherapy   carboplatin, docetaxel and Herceptin  x 6 completed April 2005     Surgery   bilateral mastectomies with bilateral axillary lymph node dissection 12/07/2004, showing             (a) on the right, a mypT1c ypN1 invasive ductal carcinoma, grade 3, estrogen and progesterone receptor negative, HER-2 positive, with an MIB-1 of 31%             (b) on the left, ypT2 ypN1 invasive ductal carcinoma, grade 2, estrogen and progesterone receptor negative, HER-2 positive, with an MIB-1 of 35%.   01/2015 - 02/2015 Radiation Therapy   Adj XRT    - 03/2018 Chemotherapy   Ixampra   10/25/2022 -  Chemotherapy   Patient is on Treatment Plan :  BREAST Trastuzumab  IV (8/6) or SQ (600) D1 q21d       CHIEF COMPLIANT: Follow-up on Herceptin  and recent hospitalizations for sepsis  HISTORY OF PRESENT ILLNESS:  History of Present Illness [name] is a 73 year old female who presents for follow-up after a neck fracture. She is accompanied by her cousin, [name].  She has a history of anemia with hemoglobin levels typically around 10 to 11 g/dL. During her recent hospitalization, her hemoglobin dropped to 8.8 g/dL and has since improved to 9.2 g/dL. There is a suspicion of iron  deficiency contributing to her anemia, although her iron  levels have not been checked recently.  She experienced sepsis prior to her fall, which resulted in unconsciousness and a significant loss of mobility. She is currently unable to walk or stand independently but is undergoing physical therapy with some progress noted. She is in a rehabilitation facility for pain management related to her neck fracture, which occurred after falling off a bed on April 16, 2024. She wears a neck brace continuously for six weeks, with no surgical intervention required.     ALLERGIES:  is allergic to penicillins, adhesive [tape], ceftriaxone , and daptomycin .  MEDICATIONS:  Current Outpatient Medications  Medication Sig Dispense Refill   acetaminophen  (TYLENOL ) 500 MG tablet Take 500-1,000 mg by mouth every 6 (six) hours as needed for mild pain (pain score 1-3), fever or moderate pain (pain score 4-6).     albuterol  (VENTOLIN  HFA) 108 (90 Base) MCG/ACT inhaler Inhale 2 puffs into the lungs every 6 (six) hours as needed for wheezing. 1 each 6  ALPRAZolam  (XANAX ) 1 MG tablet Take 1 tablet (1 mg total) by mouth 3 (three) times daily as needed for anxiety. 10 tablet 3   cyclobenzaprine  (FLEXERIL ) 5 MG tablet Take 1 tablet (5 mg total) by mouth 2 (two) times daily as needed for muscle spasms. 60 tablet 0   furosemide  (LASIX ) 40 MG tablet Take 40 mg by mouth as needed for fluid or  edema.     gabapentin  (NEURONTIN ) 300 MG capsule TAKE 2 CAPSULES BY MOUTH THREE TIMES DAILY 540 capsule 1   ondansetron  (ZOFRAN ) 8 MG tablet Take 1 tablet (8 mg total) by mouth every 8 (eight) hours as needed for nausea or vomiting. 30 tablet 0   oxyCODONE  (OXY IR/ROXICODONE ) 5 MG immediate release tablet Take 1 tablet (5 mg total) by mouth every 4 (four) hours as needed for moderate pain (pain score 4-6) or severe pain (pain score 7-10). 10 tablet 0   amiodarone  (PACERONE ) 200 MG tablet Take 1 tablet (200 mg total) by mouth 2 (two) times daily.     apixaban  (ELIQUIS ) 5 MG TABS tablet Take 1 tablet (5 mg total) by mouth 2 (two) times daily. 180 tablet 3   atorvastatin (LIPITOR) 20 MG tablet Take 20 mg by mouth daily.     CALCIUM  PO Take 2 tablets by mouth daily.     DULoxetine  (CYMBALTA ) 60 MG capsule Take 60 mg by mouth daily.     omeprazole (PRILOSEC) 20 MG capsule Take 20 mg by mouth daily.     pantoprazole  (PROTONIX ) 40 MG tablet Take 1 tablet (40 mg total) by mouth daily. (Patient not taking: Reported on 05/13/2024) 30 tablet 0   potassium chloride  SA (KLOR-CON  M) 20 MEQ tablet Take 2 tablets (40 mEq total) by mouth daily. Take 2 tablets twice a day for 2 days and after that 2 tablets once a day 90 tablet 3   rosuvastatin  (CRESTOR ) 10 MG tablet Take 1 tablet (10 mg total) by mouth daily. (Patient not taking: Reported on 05/13/2024) 30 tablet 11   temazepam  (RESTORIL ) 30 MG capsule Take 1 capsule (30 mg total) by mouth at bedtime as needed for sleep. 2 capsule 0   No current facility-administered medications for this visit.   Facility-Administered Medications Ordered in Other Visits  Medication Dose Route Frequency Provider Last Rate Last Admin   sodium chloride  flush (NS) 0.9 % injection 10 mL  10 mL Intravenous PRN Magrinat, Gustav C, MD   10 mL at 12/15/15 1200   sodium chloride  flush (NS) 0.9 % injection 10 mL  10 mL Intracatheter PRN Magrinat, Sandria BROCKS, MD   10 mL at 08/14/18 1116    sodium chloride  flush (NS) 0.9 % injection 10 mL  10 mL Intracatheter PRN Magrinat, Sandria BROCKS, MD        PHYSICAL EXAMINATION: ECOG PERFORMANCE STATUS: 1 - Symptomatic but completely ambulatory  Vitals:   05/13/24 0941  BP: 134/83  Pulse: 79  Resp: 18  Temp: 98.2 F (36.8 C)  SpO2: 100%   Filed Weights      LABORATORY DATA:  I have reviewed the data as listed    Latest Ref Rng & Units 04/14/2024    9:09 AM 04/07/2024   11:51 AM 03/29/2024    3:55 AM  CMP  Glucose 70 - 99 mg/dL 95  880  87   BUN 8 - 23 mg/dL 8  8  5    Creatinine 0.44 - 1.00 mg/dL 9.44  9.02  9.45   Sodium 135 -  145 mmol/L 137  138  136   Potassium 3.5 - 5.1 mmol/L 4.3  3.0  3.3   Chloride 98 - 111 mmol/L 105  104  105   CO2 22 - 32 mmol/L 27  22  21    Calcium  8.9 - 10.3 mg/dL 8.3  8.8  8.2   Total Protein 6.5 - 8.1 g/dL 6.3     Total Bilirubin 0.0 - 1.2 mg/dL 0.6     Alkaline Phos 38 - 126 U/L 90     AST 15 - 41 U/L 13     ALT 0 - 44 U/L 10       Lab Results  Component Value Date   WBC 7.6 05/13/2024   HGB 9.2 (L) 05/13/2024   HCT 26.4 (L) 05/13/2024   MCV 80.7 05/13/2024   PLT 435 (H) 05/13/2024   NEUTROABS 4.1 05/13/2024    ASSESSMENT & PLAN:  Breast cancer metastasized to multiple sites [facility]) 004: Liver. Lung and bone mets Neoadj chemo TCH X 6 completed April 2005 foll by herceptin  maintenance   bilateral mastectomies with bilateral axillary lymph node dissection 12/07/2004, showing             (a) on the right, a mypT1c ypN1 invasive ductal carcinoma, grade 3, estrogen and progesterone receptor negative, HER-2 positive, with an MIB-1 of 31%             (b) on the left, ypT2 ypN1 invasive ductal carcinoma, grade 2, estrogen and progesterone receptor negative, HER-2 positive, with an MIB-1 of 35%.   Adj XRT ----------------------------------------------------------------------------------------------------------------------------------------  Current treatment: Herceptin  maintenance  therapy Plan to continue Herceptin  indefinitely every 28 days.   SVC syndrome: On lifelong anticoagulation.  (Because of cost issues we may be switching her from Eliquis  to Pradaxa.  I gave her additional information about Pradaxa and the cost)   Bone scan 09/27/2022: No bone metastases ECHO: 10/31/22: EF 55% (Dr. Rolan is doing her echocardiograms now every 9 months) CT CAP 05/05/2023: No evidence of recurrent or metastatic disease in chest abdomen pelvis CT CAP 11/04/2023: No evidence of recurrent or metastatic disease.  Healing or healed sternal/anterolateral right fifth and sixth rib fractures. CT CAP 03/12/2024: Esophagitis, trace right pleural effusion, left SI joint ? Septic arthritis, no evidence of metastases Brain MRI 03/16/2024: No brain metastases, small bilateral cervical lymph nodes nonspecific   Anticoagulation: Currently on dabigatran Continue monthly Herceptin  infusions.  Recent hospitalization for sepsis: Discharged 03/29/2024 still feeling weak and is currently in rehab. Recent fall with neck injury: She is on the brace for 6 weeks.  Anemia: Secondary to recent hospitalization: Microcytic in nature.  I recommend IV iron .  We will see if we can get authorization to infuse iron  today along with her Herceptin  treatment. Scans every 6 months Return to clinic in 4 weeks for labs and Herceptin  infusions. ------------------------------------- Assessment and Plan Assessment & Plan Metastatic right breast carcinoma Continues Herceptin  infusions without immunosuppression or sepsis contribution. Neck fracture managed conservatively. - Continue Herceptin  infusions as scheduled.  Anemia likely secondary to iron  deficiency Hemoglobin improved to 9.2 g/dL. Suspected iron  deficiency, iron  levels not checked. - Administer iron  infusion during current visit if time and approval allow. - If unable to administer today, schedule iron  infusion with next Herceptin  treatment in four  weeks.      No orders of the defined types were placed in this encounter.  The patient has a good understanding of the overall plan. she agrees with it. she will  call with any problems that may develop before the next visit here. Total time spent: 30 mins including face to face time and time spent for planning, charting and co-ordination of care   Naomi MARLA Chad, MD 05/13/24

## 2024-05-13 NOTE — Telephone Encounter (Signed)
 Johnston, Insurance will not cover Venofer . Would you like to use Feraheme??  Auth Submission: NO AUTH NEEDED Site of care: Site of care: [facility] Payer: Medicare A/B Medication & CPT/J Code(s) submitted: Feraheme (ferumoxytol) [id number] Diagnosis Code:  Route of submission (phone, fax, portal):  Phone # Fax # Auth type: Buy/Bill PB Units/visits requested: X2 DOSES Reference number:  Approval from: 05/13/24 to 08/26/24

## 2024-05-13 NOTE — Progress Notes (Signed)
 Per infusion, pt able to receive first dose of IV venofer  300 mg here but will need to receive future 2 doses at [facility].  Pt notified that [facility] will reach out to her and pt verbalized understanding, orders have been updated.

## 2024-05-13 NOTE — Assessment & Plan Note (Signed)
 004: Liver. Lung and bone mets Neoadj chemo TCH X 6 completed April 2005 foll by herceptin  maintenance   bilateral mastectomies with bilateral axillary lymph node dissection 12/07/2004, showing             (a) on the right, a mypT1c ypN1 invasive ductal carcinoma, grade 3, estrogen and progesterone receptor negative, HER-2 positive, with an MIB-1 of 31%             (b) on the left, ypT2 ypN1 invasive ductal carcinoma, grade 2, estrogen and progesterone receptor negative, HER-2 positive, with an MIB-1 of 35%.   Adj XRT ----------------------------------------------------------------------------------------------------------------------------------------  Current treatment: Herceptin  maintenance therapy Plan to continue Herceptin  indefinitely every 28 days.   SVC syndrome: On lifelong anticoagulation.  (Because of cost issues we may be switching her from Eliquis  to Pradaxa.  I gave her additional information about Pradaxa and the cost)   Bone scan 09/27/2022: No bone metastases ECHO: 10/31/22: EF 55% (Dr. Rolan is doing her echocardiograms now every 9 months) CT CAP 05/05/2023: No evidence of recurrent or metastatic disease in chest abdomen pelvis CT CAP 11/04/2023: No evidence of recurrent or metastatic disease.  Healing or healed sternal/anterolateral right fifth and sixth rib fractures. CT CAP 03/12/2024: Esophagitis, trace right pleural effusion, left SI joint ? Septic arthritis, no evidence of metastases Brain MRI 03/16/2024: No brain metastases, small bilateral cervical lymph nodes nonspecific   Anticoagulation: Currently on dabigatran Continue monthly Herceptin  infusions. Scans every 6 months

## 2024-05-13 NOTE — Patient Instructions (Signed)
 [facility] - A DEPT OF [facility]  Discharge Instructions: Thank you for choosing [facility] to provide your oncology and hematology care.   If you have a lab appointment with the Cancer Center, please go directly to the Cancer Center and check in at the registration area.   Wear comfortable clothing and clothing appropriate for easy access to any Portacath or PICC line.   We strive to give you quality time with your provider. You may need to reschedule your appointment if you arrive late (15 or more minutes).  Arriving late affects you and other patients whose appointments are after yours.  Also, if you miss three or more appointments without notifying the office, you may be dismissed from the clinic at the provider's discretion.      For prescription refill requests, have your pharmacy contact our office and allow 72 hours for refills to be completed.    Today you received the following chemotherapy and/or immunotherapy agents trastuzumab       To help prevent nausea and vomiting after your treatment, we encourage you to take your nausea medication as directed.  BELOW ARE SYMPTOMS THAT SHOULD BE REPORTED IMMEDIATELY: *FEVER GREATER THAN 100.4 F (38 C) OR HIGHER *CHILLS OR SWEATING *NAUSEA AND VOMITING THAT IS NOT CONTROLLED WITH YOUR NAUSEA MEDICATION *UNUSUAL SHORTNESS OF BREATH *UNUSUAL BRUISING OR BLEEDING *URINARY PROBLEMS (pain or burning when urinating, or frequent urination) *BOWEL PROBLEMS (unusual diarrhea, constipation, pain near the anus) TENDERNESS IN MOUTH AND THROAT WITH OR WITHOUT PRESENCE OF ULCERS (sore throat, sores in mouth, or a toothache) UNUSUAL RASH, SWELLING OR PAIN  UNUSUAL VAGINAL DISCHARGE OR ITCHING   Items with * indicate a potential emergency and should be followed up as soon as possible or go to the Emergency Department if any problems should occur.  Please show the CHEMOTHERAPY ALERT CARD or IMMUNOTHERAPY  ALERT CARD at check-in to the Emergency Department and triage nurse.  Should you have questions after your visit or need to cancel or reschedule your appointment, please contact [facility] - A DEPT OF [facility]  Dept: [phone]  and follow the prompts.  Office hours are 8:00 a.m. to 4:30 p.m. Monday - Friday. Please note that voicemails left after 4:00 p.m. may not be returned until the following business day.  We are closed weekends and major holidays. You have access to a nurse at all times for urgent questions. Please call the main number to the clinic Dept: [phone] and follow the prompts.   For any non-urgent questions, you may also contact your provider using MyChart. We now offer e-Visits for anyone 54 and older to request care online for non-urgent symptoms. For details visit [email or web address].   Also download the MyChart app! Go to the app store, search MyChart, open the app, select [place], and log in with your MyChart username and password.  Iron  Sucrose Injection What is this medication? IRON  SUCROSE (EYE ern SOO krose) treats low levels of iron  (iron  deficiency anemia) in people with kidney disease. Iron  is a mineral that plays an important role in making red blood cells, which carry oxygen from your lungs to the rest of your body. This medicine may be used for other purposes; ask your health care provider or pharmacist if you have questions. COMMON BRAND NAME(S): Venofer  What should I tell my care team before I take this medication? They need to know if  you have any of these conditions: Anemia not caused by low iron  levels Heart disease High levels of iron  in the blood Kidney disease Liver disease An unusual or allergic reaction to iron , other medications, foods, dyes, or preservatives Pregnant or trying to get pregnant Breastfeeding How should I use this medication? This medication is infused into a vein. It is given by  your care team in a hospital or clinic setting. Talk to your care team about the use of this medication in children. While it may be prescribed for children as young as 2 years for selected conditions, precautions do apply. Overdosage: If you think you have taken too much of this medicine contact a poison control center or emergency room at once. NOTE: This medicine is only for you. Do not share this medicine with others. What if I miss a dose? Keep appointments for follow-up doses. It is important not to miss your dose. Call your care team if you are unable to keep an appointment. What may interact with this medication? Do not take this medication with any of the following: Deferoxamine Dimercaprol Other iron  products This medication may also interact with the following: Chloramphenicol Deferasirox This list may not describe all possible interactions. Give your health care provider a list of all the medicines, herbs, non-prescription drugs, or dietary supplements you use. Also tell them if you smoke, drink alcohol , or use illegal drugs. Some items may interact with your medicine. What should I watch for while using this medication? Visit your care team for regular checks on your progress. Tell your care team if your symptoms do not start to get better or if they get worse. You may need blood work done while you are taking this medication. You may need to eat more foods that contain iron . Talk to your care team. Foods that contain iron  include whole grains or cereals, dried fruits, beans, peas, leafy green vegetables, and organ meats (liver, kidney). What side effects may I notice from receiving this medication? Side effects that you should report to your care team as soon as possible: Allergic reactions--skin rash, itching, hives, swelling of the face, lips, tongue, or throat Low blood pressure--dizziness, feeling faint or lightheaded, blurry vision Shortness of breath Side effects that  usually do not require medical attention (report to your care team if they continue or are bothersome): Flushing Headache Joint pain Muscle pain Nausea Pain, redness, or irritation at injection site This list may not describe all possible side effects. Call your doctor for medical advice about side effects. You may report side effects to FDA at 1-800-FDA-1088. Where should I keep my medication? This medication is given in a hospital or clinic. It will not be stored at home. NOTE: This sheet is a summary. It may not cover all possible information. If you have questions about this medicine, talk to your doctor, pharmacist, or health care provider.  2024 Elsevier/Gold Standard (2023-04-03 00:00:00)

## 2024-05-14 ENCOUNTER — Other Ambulatory Visit: Payer: Self-pay

## 2024-05-15 ENCOUNTER — Telehealth: Payer: Self-pay | Admitting: *Deleted

## 2024-05-15 NOTE — Telephone Encounter (Signed)
 Received call from Elveria, Presenter, broadcasting at pt facility requesting that [employer] her with pt IV iron  appts.  RN alerted [employer] to contact [place] at [phone].

## 2024-05-17 ENCOUNTER — Other Ambulatory Visit: Payer: Self-pay

## 2024-05-18 ENCOUNTER — Other Ambulatory Visit (HOSPITAL_COMMUNITY): Payer: Self-pay | Admitting: Pharmacy Technician

## 2024-05-19 ENCOUNTER — Telehealth (HOSPITAL_COMMUNITY): Payer: Self-pay | Admitting: Cardiology

## 2024-05-21 DIAGNOSIS — S12112D Nondisplaced Type II dens fracture, subsequent encounter for fracture with routine healing: Secondary | ICD-10-CM | POA: Diagnosis not present

## 2024-05-21 DIAGNOSIS — E669 Obesity, unspecified: Secondary | ICD-10-CM | POA: Diagnosis not present

## 2024-05-21 DIAGNOSIS — G4700 Insomnia, unspecified: Secondary | ICD-10-CM | POA: Diagnosis not present

## 2024-05-22 ENCOUNTER — Telehealth (HOSPITAL_COMMUNITY): Payer: Self-pay

## 2024-05-22 ENCOUNTER — Encounter (HOSPITAL_COMMUNITY): Payer: Self-pay | Admitting: Physician Assistant

## 2024-05-22 ENCOUNTER — Telehealth: Payer: Self-pay

## 2024-05-22 DIAGNOSIS — M25561 Pain in right knee: Secondary | ICD-10-CM | POA: Diagnosis not present

## 2024-05-22 NOTE — Telephone Encounter (Signed)
 [name] from [facility] called to see when [name] would get her infusions scheduled for iron . Spoke to [facility] RN Rocky and they have been trying to reach [name] and did not know she was at a facility. [name] is now in a facility at [facility] and I updated her chart with [name]'s information.  Myles Rocky to call [name] to get her scheduled and she will call her to get [name]'s iron  infusion scheduled. Andrea CHRISTELLA Plunk, RN

## 2024-05-22 NOTE — Telephone Encounter (Signed)
 Auth Submission: NO AUTH NEEDED Site of care: Site of care: [facility] Payer: Medicare A/B Medication & CPT/J Code(s) submitted: Feraheme (ferumoxytol) [id number] Diagnosis Code: D50.9 Route of submission (phone, fax, portal):  Phone # Fax # Auth type: Buy/Bill HB Units/visits requested: 510mg  x 1 dose Reference number:  Approval from: 05/22/24 to 08/21/24

## 2024-05-27 ENCOUNTER — Ambulatory Visit (HOSPITAL_COMMUNITY)
Admission: RE | Admit: 2024-05-27 | Discharge: 2024-05-27 | Disposition: A | Discharge status: Home / Self Care | Source: Ambulatory Visit | Attending: Physician Assistant | Admitting: Physician Assistant

## 2024-05-27 VITALS — BP 146/85 | HR 66 | Temp 98.0°F | Resp 17

## 2024-05-27 DIAGNOSIS — D508 Other iron deficiency anemias: Secondary | ICD-10-CM | POA: Insufficient documentation

## 2024-05-27 MED ORDER — HEPARIN SOD (PORK) LOCK FLUSH 100 UNIT/ML IV SOLN
500.0000 [IU] | Freq: Once | INTRAVENOUS | Status: AC
  Administered 2024-05-27: 500 [IU] via INTRAVENOUS

## 2024-05-27 MED ORDER — SODIUM CHLORIDE 0.9 % IV SOLN
510.0000 mg | Freq: Once | INTRAVENOUS | Status: AC
  Administered 2024-05-27: 510 mg via INTRAVENOUS
  Filled 2024-05-27: qty 510

## 2024-05-27 MED ORDER — HEPARIN SOD (PORK) LOCK FLUSH 100 UNIT/ML IV SOLN
INTRAVENOUS | Status: AC
  Filled 2024-05-27: qty 5

## 2024-06-03 DIAGNOSIS — Z6841 Body Mass Index (BMI) 40.0 and over, adult: Secondary | ICD-10-CM | POA: Diagnosis not present

## 2024-06-03 DIAGNOSIS — S12112A Nondisplaced Type II dens fracture, initial encounter for closed fracture: Secondary | ICD-10-CM | POA: Diagnosis not present

## 2024-06-05 DIAGNOSIS — R52 Pain, unspecified: Secondary | ICD-10-CM | POA: Diagnosis not present

## 2024-06-05 DIAGNOSIS — M25561 Pain in right knee: Secondary | ICD-10-CM | POA: Diagnosis not present

## 2024-06-08 ENCOUNTER — Other Ambulatory Visit: Payer: Self-pay

## 2024-06-08 DIAGNOSIS — G4700 Insomnia, unspecified: Secondary | ICD-10-CM | POA: Diagnosis not present

## 2024-06-08 DIAGNOSIS — I10 Essential (primary) hypertension: Secondary | ICD-10-CM | POA: Diagnosis not present

## 2024-06-08 DIAGNOSIS — F419 Anxiety disorder, unspecified: Secondary | ICD-10-CM | POA: Diagnosis not present

## 2024-06-08 DIAGNOSIS — Z741 Need for assistance with personal care: Secondary | ICD-10-CM | POA: Diagnosis not present

## 2024-06-08 DIAGNOSIS — C50911 Malignant neoplasm of unspecified site of right female breast: Secondary | ICD-10-CM

## 2024-06-09 ENCOUNTER — Inpatient Hospital Stay

## 2024-06-09 ENCOUNTER — Encounter: Payer: Self-pay | Admitting: Hematology and Oncology

## 2024-06-09 ENCOUNTER — Inpatient Hospital Stay: Attending: Oncology

## 2024-06-09 ENCOUNTER — Encounter: Payer: Self-pay | Admitting: Adult Health

## 2024-06-09 ENCOUNTER — Inpatient Hospital Stay (HOSPITAL_BASED_OUTPATIENT_CLINIC_OR_DEPARTMENT_OTHER): Admitting: Adult Health

## 2024-06-09 VITALS — BP 119/75 | HR 72 | Temp 97.6°F | Resp 16

## 2024-06-09 VITALS — BP 124/74 | HR 66 | Temp 97.8°F | Resp 17

## 2024-06-09 DIAGNOSIS — D508 Other iron deficiency anemias: Secondary | ICD-10-CM

## 2024-06-09 DIAGNOSIS — C787 Secondary malignant neoplasm of liver and intrahepatic bile duct: Secondary | ICD-10-CM | POA: Insufficient documentation

## 2024-06-09 DIAGNOSIS — C50011 Malignant neoplasm of nipple and areola, right female breast: Secondary | ICD-10-CM

## 2024-06-09 DIAGNOSIS — C50911 Malignant neoplasm of unspecified site of right female breast: Secondary | ICD-10-CM

## 2024-06-09 DIAGNOSIS — I110 Hypertensive heart disease with heart failure: Secondary | ICD-10-CM | POA: Insufficient documentation

## 2024-06-09 DIAGNOSIS — C50812 Malignant neoplasm of overlapping sites of left female breast: Secondary | ICD-10-CM | POA: Diagnosis not present

## 2024-06-09 DIAGNOSIS — Z5112 Encounter for antineoplastic immunotherapy: Secondary | ICD-10-CM | POA: Insufficient documentation

## 2024-06-09 DIAGNOSIS — C7800 Secondary malignant neoplasm of unspecified lung: Secondary | ICD-10-CM | POA: Insufficient documentation

## 2024-06-09 DIAGNOSIS — C50919 Malignant neoplasm of unspecified site of unspecified female breast: Secondary | ICD-10-CM | POA: Diagnosis not present

## 2024-06-09 DIAGNOSIS — D509 Iron deficiency anemia, unspecified: Secondary | ICD-10-CM | POA: Diagnosis not present

## 2024-06-09 DIAGNOSIS — Z171 Estrogen receptor negative status [ER-]: Secondary | ICD-10-CM | POA: Diagnosis not present

## 2024-06-09 DIAGNOSIS — Z95828 Presence of other vascular implants and grafts: Secondary | ICD-10-CM

## 2024-06-09 LAB — CMP (CANCER CENTER ONLY)
ALT: 7 U/L (ref 0–44)
AST: 11 U/L — ABNORMAL LOW (ref 15–41)
Albumin: 3.1 g/dL — ABNORMAL LOW (ref 3.5–5.0)
Alkaline Phosphatase: 79 U/L (ref 38–126)
Anion gap: 6 (ref 5–15)
BUN: 14 mg/dL (ref 8–23)
CO2: 30 mmol/L (ref 22–32)
Calcium: 9.3 mg/dL (ref 8.9–10.3)
Chloride: 104 mmol/L (ref 98–111)
Creatinine: 0.82 mg/dL (ref 0.44–1.00)
GFR, Estimated: >60 mL/min (ref >60)
Glucose, Bld: 94 mg/dL (ref 70–99)
Potassium: 3.8 mmol/L (ref 3.5–5.1)
Sodium: 140 mmol/L (ref 135–145)
Total Bilirubin: 0.7 mg/dL (ref 0.0–1.2)
Total Protein: 7.1 g/dL (ref 6.5–8.1)

## 2024-06-09 LAB — CBC WITH DIFFERENTIAL (CANCER CENTER ONLY)
Abs Immature Granulocytes: 0.01 K/uL (ref 0.00–0.07)
Basophils Absolute: 0 K/uL (ref 0.0–0.1)
Basophils Relative: 1 %
Eosinophils Absolute: 0.3 K/uL (ref 0.0–0.5)
Eosinophils Relative: 5 %
HCT: 28 % — ABNORMAL LOW (ref 36.0–46.0)
Hemoglobin: 9.8 g/dL — ABNORMAL LOW (ref 12.0–15.0)
Immature Granulocytes: 0 %
Lymphocytes Relative: 32 %
Lymphs Abs: 2.1 K/uL (ref 0.7–4.0)
MCH: 28.5 pg (ref 26.0–34.0)
MCHC: 35 g/dL (ref 30.0–36.0)
MCV: 81.4 fL (ref 80.0–100.0)
Monocytes Absolute: 0.6 K/uL (ref 0.1–1.0)
Monocytes Relative: 9 %
Neutro Abs: 3.5 K/uL (ref 1.7–7.7)
Neutrophils Relative %: 53 %
Platelet Count: 303 K/uL (ref 150–400)
RBC: 3.44 MIL/uL — ABNORMAL LOW (ref 3.87–5.11)
RDW: 15.2 % (ref 11.5–15.5)
WBC Count: 6.5 K/uL (ref 4.0–10.5)
nRBC: 0 % (ref 0.0–0.2)

## 2024-06-09 LAB — SAMPLE TO BLOOD BANK

## 2024-06-09 MED ORDER — ACETAMINOPHEN 325 MG PO TABS
650.0000 mg | ORAL_TABLET | Freq: Once | ORAL | Status: AC
  Administered 2024-06-09: 650 mg via ORAL
  Filled 2024-06-09: qty 2

## 2024-06-09 MED ORDER — LORAZEPAM 2 MG/ML IJ SOLN
1.0000 mg | Freq: Once | INTRAMUSCULAR | Status: AC
  Administered 2024-06-09: 1 mg via INTRAVENOUS
  Filled 2024-06-09: qty 1

## 2024-06-09 MED ORDER — SODIUM CHLORIDE 0.9 % IV SOLN: Freq: Once | INTRAVENOUS | Status: AC

## 2024-06-09 MED ORDER — SODIUM CHLORIDE 0.9 % IV SOLN
510.0000 mg | Freq: Once | INTRAVENOUS | Status: AC
  Administered 2024-06-09: 510 mg via INTRAVENOUS
  Filled 2024-06-09: qty 510

## 2024-06-09 MED ORDER — SODIUM CHLORIDE 0.9 % IV SOLN: INTRAVENOUS | Status: DC

## 2024-06-09 MED ORDER — TRASTUZUMAB-DTTB CHEMO 150 MG IV SOLR
750.0000 mg | Freq: Once | INTRAVENOUS | Status: AC
  Administered 2024-06-09: 750 mg via INTRAVENOUS
  Filled 2024-06-09: qty 35.71
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
## 2024-06-09 NOTE — Patient Instructions (Signed)
 [facility] - A DEPT OF [facility]  Discharge Instructions: Thank you for choosing [facility] to provide your oncology and hematology care.   If you have a lab appointment with the Cancer Center, please go directly to the Cancer Center and check in at the registration area.   Wear comfortable clothing and clothing appropriate for easy access to any Portacath or PICC line.   We strive to give you quality time with your provider. You may need to reschedule your appointment if you arrive late (15 or more minutes).  Arriving late affects you and other patients whose appointments are after yours.  Also, if you miss three or more appointments without notifying the office, you may be dismissed from the clinic at the provider's discretion.      For prescription refill requests, have your pharmacy contact our office and allow 72 hours for refills to be completed.    Today you received the following chemotherapy and/or immunotherapy agents: trastuzumab -dttb (ONTRUZANT ), Feraheme     To help prevent nausea and vomiting after your treatment, we encourage you to take your nausea medication as directed.  BELOW ARE SYMPTOMS THAT SHOULD BE REPORTED IMMEDIATELY: *FEVER GREATER THAN 100.4 F (38 C) OR HIGHER *CHILLS OR SWEATING *NAUSEA AND VOMITING THAT IS NOT CONTROLLED WITH YOUR NAUSEA MEDICATION *UNUSUAL SHORTNESS OF BREATH *UNUSUAL BRUISING OR BLEEDING *URINARY PROBLEMS (pain or burning when urinating, or frequent urination) *BOWEL PROBLEMS (unusual diarrhea, constipation, pain near the anus) TENDERNESS IN MOUTH AND THROAT WITH OR WITHOUT PRESENCE OF ULCERS (sore throat, sores in mouth, or a toothache) UNUSUAL RASH, SWELLING OR PAIN  UNUSUAL VAGINAL DISCHARGE OR ITCHING   Items with * indicate a potential emergency and should be followed up as soon as possible or go to the Emergency Department if any problems should occur.  Please show the CHEMOTHERAPY  ALERT CARD or IMMUNOTHERAPY ALERT CARD at check-in to the Emergency Department and triage nurse.  Should you have questions after your visit or need to cancel or reschedule your appointment, please contact [facility] - A DEPT OF [facility]  Dept: [phone]  and follow the prompts.  Office hours are 8:00 a.m. to 4:30 p.m. Monday - Friday. Please note that voicemails left after 4:00 p.m. may not be returned until the following business day.  We are closed weekends and major holidays. You have access to a nurse at all times for urgent questions. Please call the main number to the clinic Dept: [phone] and follow the prompts.   For any non-urgent questions, you may also contact your provider using MyChart. We now offer e-Visits for anyone 37 and older to request care online for non-urgent symptoms. For details visit [email or web address].   Also download the MyChart app! Go to the app store, search MyChart, open the app, select [place], and log in with your MyChart username and password.

## 2024-06-09 NOTE — Assessment & Plan Note (Signed)
 004: Liver. Lung and bone mets Neoadj chemo TCH X 6 completed April 2005 foll by herceptin  maintenance   bilateral mastectomies with bilateral axillary lymph node dissection 12/07/2004, showing             (a) on the right, a mypT1c ypN1 invasive ductal carcinoma, grade 3, estrogen and progesterone receptor negative, HER-2 positive, with an MIB-1 of 31%             (b) on the left, ypT2 ypN1 invasive ductal carcinoma, grade 2, estrogen and progesterone receptor negative, HER-2 positive, with an MIB-1 of 35%.   Adj XRT ----------------------------------------------------------------------------------------------------------------------------------------  Current treatment: Herceptin  maintenance therapy Plan to continue Herceptin  indefinitely every 28 days.   SVC syndrome: On lifelong anticoagulation.  (Because of cost issues we may be switching her from Eliquis  to Pradaxa.  I gave her additional information about Pradaxa and the cost)   Bone scan 09/27/2022: No bone metastases ECHO: 10/31/22: EF 55% (Dr. Rolan is doing her echocardiograms now every 9 months) CT CAP 05/05/2023: No evidence of recurrent or metastatic disease in chest abdomen pelvis CT CAP 11/04/2023: No evidence of recurrent or metastatic disease.  Healing or healed sternal/anterolateral right fifth and sixth rib fractures. CT CAP 03/12/2024: Esophagitis, trace right pleural effusion, left SI joint ? Septic arthritis, no evidence of metastases Brain MRI 03/16/2024: No brain metastases, small bilateral cervical lymph nodes nonspecific   Anticoagulation: Currently on dabigatran Continue monthly Herceptin  infusions. Scans every 6 months

## 2024-06-10 ENCOUNTER — Other Ambulatory Visit: Payer: Self-pay

## 2024-06-12 ENCOUNTER — Encounter: Payer: Self-pay | Admitting: Oncology

## 2024-06-12 ENCOUNTER — Encounter: Payer: Self-pay | Admitting: Hematology and Oncology

## 2024-06-12 ENCOUNTER — Encounter (HOSPITAL_COMMUNITY): Payer: Self-pay | Admitting: Physician Assistant

## 2024-06-16 DIAGNOSIS — M25561 Pain in right knee: Secondary | ICD-10-CM | POA: Diagnosis not present

## 2024-06-16 DIAGNOSIS — M25562 Pain in left knee: Secondary | ICD-10-CM | POA: Diagnosis not present

## 2024-06-17 DIAGNOSIS — M1711 Unilateral primary osteoarthritis, right knee: Secondary | ICD-10-CM | POA: Diagnosis not present

## 2024-06-18 DIAGNOSIS — E669 Obesity, unspecified: Secondary | ICD-10-CM | POA: Diagnosis not present

## 2024-06-18 DIAGNOSIS — K219 Gastro-esophageal reflux disease without esophagitis: Secondary | ICD-10-CM | POA: Diagnosis not present

## 2024-06-18 DIAGNOSIS — M1990 Unspecified osteoarthritis, unspecified site: Secondary | ICD-10-CM | POA: Diagnosis not present

## 2024-06-18 DIAGNOSIS — M4628 Osteomyelitis of vertebra, sacral and sacrococcygeal region: Secondary | ICD-10-CM | POA: Diagnosis not present

## 2024-06-23 DIAGNOSIS — R52 Pain, unspecified: Secondary | ICD-10-CM | POA: Diagnosis not present

## 2024-06-27 ENCOUNTER — Other Ambulatory Visit: Payer: Self-pay

## 2024-07-03 ENCOUNTER — Telehealth: Payer: Self-pay | Admitting: Nurse Practitioner

## 2024-07-03 NOTE — Telephone Encounter (Signed)
 Called the patient to inform them that their provider change .

## 2024-07-05 NOTE — Progress Notes (Unsigned)
 [facility]   Telephone:([phone]) [phone] Fax:(336) [phone]    Patient Care Team: Jefferey Fitch, MD as PCP - General (Internal Medicine) Glynda Franky NOVAK., MD as Consulting Physician (Pain Medicine) Larnell Purchase, MD as Referring Physician (Orthopedic Surgery) Vola Duncans, DO as Consulting Physician (Orthopedic Surgery) Rolan Ezra RAMAN, MD as Consulting Physician (Cardiology) Bensimhon, Toribio SAUNDERS, MD as Consulting Physician (Cardiology) Ardeen Rollene Kass, MD (Radiology) Fleeta Rothman, Jomarie SAILOR, MD as Consulting Physician (Infectious Diseases) Shannon Agent, MD as Consulting Physician (Radiation Oncology) Odean Potts, MD as Consulting Physician (Hematology and Oncology) Crawford Morna Pickle, NP as Nurse Practitioner (Hematology and Oncology)   CHIEF COMPLAINT: Follow-up metastatic breast cancer  Oncology History  Breast cancer metastasized to multiple sites [facility])  09/05/2012 - 09/27/2022 Chemotherapy   Patient is on Treatment Plan : BREAST Trastuzumab  q28d     02/26/2013 Initial Diagnosis   history of inflammatory right breast cancer metastatic at presentation September 2004 with involvement of liver and bone, HER-2 positive, estrogen and progesterone receptor negative      - 11/2013 Chemotherapy   carboplatin, docetaxel and Herceptin  x 6 completed April 2005     Surgery   bilateral mastectomies with bilateral axillary lymph node dissection 12/07/2004, showing             (a) on the right, a mypT1c ypN1 invasive ductal carcinoma, grade 3, estrogen and progesterone receptor negative, HER-2 positive, with an MIB-1 of 31%             (b) on the left, ypT2 ypN1 invasive ductal carcinoma, grade 2, estrogen and progesterone receptor negative, HER-2 positive, with an MIB-1 of 35%.   01/2015 - 02/2015 Radiation Therapy   Adj XRT    - 03/2018 Chemotherapy   Ixampra   10/25/2022 -  Chemotherapy   Patient is on Treatment Plan : BREAST Trastuzumab  IV (8/6) or SQ  (600) D1 q21d        CURRENT THERAPY: Maintenance Herceptin  every 4 weeks  INTERVAL HISTORY Ms. [name] returns for follow-up and treatment as scheduled. Plans to be discharged from rehab today and continue PT from an outside facility. Doing well overall with similar chronic knee pain, takes pain meds 2-3 times daily. Bowels moving. Energy and appetite are adequate. Denies new bone pain or any other specific complaints.   ROS  All other systems reviewed and negative   Past Medical History:  Diagnosis Date   Anemia    pt pt report   Anxiety    Bacteremia due to group B Streptococcus    June '25   Breast cancer (HCC)    mets to liver and lung   Breast cancer metastasized to multiple sites [facility]) 02/26/2013   Cellulitis    CHF (congestive heart failure) (HCC)    History of chemotherapy 09/2004   taxotere/herceptin /carboplatin   Hypertension    Neuropathy    Osteomyelitis of sacrum (HCC)    Radiation 07/31/2006   left upper chest   Radiation 06/17/2006-06/27/2006   6480 cGy bilat. chest wall   Septic arthritis (HCC)    left SI joint   SVC syndrome    Thrombosis      Past Surgical History:  Procedure Laterality Date   ANKLE SURGERY Left    BACK SURGERY     CHOLECYSTECTOMY  08/28/1987   ESOPHAGOGASTRODUODENOSCOPY Left 01/21/2024   Procedure: EGD (ESOPHAGOGASTRODUODENOSCOPY);  Surgeon: Burnette Fallow, MD;  Location: [facility];  Service: Gastroenterology;  Laterality: Left;   MASS  EXCISION Left 05/10/2022   Procedure: EXCISION OF LEFT CHEST WALL MASS;  Surgeon: Vanderbilt Ned, MD;  Location: [facility] OR;  Service: General;  Laterality: Left;   MASTECTOMY Bilateral    w/ lymph node removal per patient   PERIPHERALLY INSERTED CENTRAL CATHETER INSERTION     PICC LINE INSERTION     PICC LINE REMOVAL ([facility] HX)     SURGICAL EXCISION OF EXCESSIVE SKIN     TUBAL LIGATION  08/27/1984     Outpatient Encounter Medications as of 07/07/2024  Medication Sig Note   acetaminophen  (TYLENOL )  500 MG tablet Take 500-1,000 mg by mouth every 6 (six) hours as needed for mild pain (pain score 1-3), fever or moderate pain (pain score 4-6).    albuterol  (VENTOLIN  HFA) 108 (90 Base) MCG/ACT inhaler Inhale 2 puffs into the lungs every 6 (six) hours as needed for wheezing. 03/22/2024: Needs refill   ALPRAZolam  (XANAX ) 1 MG tablet Take 1 tablet (1 mg total) by mouth 3 (three) times daily as needed for anxiety.    amiodarone  (PACERONE ) 200 MG tablet Take 1 tablet (200 mg total) by mouth 2 (two) times daily.    apixaban  (ELIQUIS ) 5 MG TABS tablet Take 1 tablet (5 mg total) by mouth 2 (two) times daily. 03/22/2024: No fill history. Patient states she has some samples    atorvastatin (LIPITOR) 20 MG tablet Take 20 mg by mouth daily.    CALCIUM  PO Take 2 tablets by mouth daily.    cyclobenzaprine  (FLEXERIL ) 5 MG tablet Take 1 tablet (5 mg total) by mouth 2 (two) times daily as needed for muscle spasms.    DULoxetine  (CYMBALTA ) 60 MG capsule Take 60 mg by mouth daily.    furosemide  (LASIX ) 40 MG tablet Take 40 mg by mouth as needed for fluid or edema.    gabapentin  (NEURONTIN ) 300 MG capsule TAKE 2 CAPSULES BY MOUTH THREE TIMES DAILY 03/22/2024: Recent Dispenses    04/15/2023 300 MG CAPS (disp 540, 90d supply   omeprazole (PRILOSEC) 20 MG capsule Take 20 mg by mouth daily.    ondansetron  (ZOFRAN ) 8 MG tablet Take 1 tablet (8 mg total) by mouth every 8 (eight) hours as needed for nausea or vomiting.    oxyCODONE  (OXY IR/ROXICODONE ) 5 MG immediate release tablet Take 1 tablet (5 mg total) by mouth every 4 (four) hours as needed for moderate pain (pain score 4-6) or severe pain (pain score 7-10).    pantoprazole  (PROTONIX ) 40 MG tablet Take 1 tablet (40 mg total) by mouth daily.    potassium chloride  SA (KLOR-CON  M) 20 MEQ tablet Take 2 tablets (40 mEq total) by mouth daily. Take 2 tablets twice a day for 2 days and after that 2 tablets once a day    rosuvastatin  (CRESTOR ) 10 MG tablet Take 1 tablet (10 mg  total) by mouth daily.    temazepam  (RESTORIL ) 30 MG capsule Take 1 capsule (30 mg total) by mouth at bedtime as needed for sleep.    Facility-Administered Encounter Medications as of 07/07/2024  Medication   sodium chloride  flush (NS) 0.9 % injection 10 mL   sodium chloride  flush (NS) 0.9 % injection 10 mL   sodium chloride  flush (NS) 0.9 % injection 10 mL     Today's Vitals   07/07/24 1008 07/07/24 1021  BP: 128/68   Pulse: 70   Resp: 17   Temp: 98.4 F (36.9 C)   SpO2: 99%   PainSc:  5    There is no  height or weight on file to calculate BMI.   ECOG PERFORMANCE STATUS: 1 - Symptomatic but completely ambulatory  PHYSICAL EXAM GENERAL:alert, no distress and comfortable SKIN: no rash  EYES: sclera clear LUNGS: clear with normal breathing effort HEART: regular rate & rhythm NEURO: alert & oriented x 3 with fluent speech PAC without erythema    CBC    Latest Ref Rng & Units 07/07/2024   10:00 AM 06/09/2024    9:09 AM 05/13/2024    9:17 AM  CBC  WBC 4.0 - 10.5 K/uL 13.7  6.5  7.6   Hemoglobin 12.0 - 15.0 g/dL 89.2  9.8  9.2   Hematocrit 36.0 - 46.0 % 29.7  28.0  26.4   Platelets 150 - 400 K/uL 259  303  435       CMP     Latest Ref Rng & Units 07/07/2024   10:00 AM 06/09/2024    9:09 AM 05/13/2024    9:17 AM  CMP  Glucose 70 - 99 mg/dL 885  94  95   BUN 8 - 23 mg/dL 27  14  14    Creatinine 0.44 - 1.00 mg/dL 8.93  9.17  9.19   Sodium 135 - 145 mmol/L 138  140  143   Potassium 3.5 - 5.1 mmol/L 4.9  3.8  4.5   Chloride 98 - 111 mmol/L 105  104  109   CO2 22 - 32 mmol/L 27  30  30    Calcium  8.9 - 10.3 mg/dL 9.7  9.3  8.5   Total Protein 6.5 - 8.1 g/dL 7.5  7.1  7.2   Total Bilirubin 0.0 - 1.2 mg/dL 0.7  0.7  0.4   Alkaline Phos 38 - 126 U/L 66  79  74   AST 15 - 41 U/L 13  11  12    ALT 0 - 44 U/L 8  7  8        ASSESSMENT & PLAN:  Breast cancer metastasized to multiple sites  2004: Liver. Lung and bone mets -S/o Neoadj chemo TCH X 6 completed April 2005  foll by herceptin  maintenance -bilateral mastectomies with bilateral axillary lymph node dissection 12/07/2004, showing a) on the right, a mypT1c ypN1 invasive ductal carcinoma, grade 3, estrogen and progesterone receptor negative, HER-2 positive, with an MIB-1 of 31% and (b) on the left, ypT2 ypN1 invasive ductal carcinoma, grade 2, estrogen and progesterone receptor negative, HER-2 positive, with an MIB-1 of 35%. -S/p adj XRT -CT CAP 03/12/2024: Esophagitis, trace right pleural effusion, left SI joint ? Septic arthritis, no evidence of metastases; Brain MRI 03/16/2024: No brain metastases, small bilateral cervical lymph nodes nonspecific -Ms. Messman is clinically doing well from a breast cancer standpoint, tolerating monthly Herceptin  without toxicity. - Labs reviewed, proceed with treatment today as scheduled, no dose modifications - Anticipate next echo in 07/2024 and scans in 09/2024  Recent septis arthritis and neck injury  -Currently in neck brace, to be discharged from rehab today  SVC syndrome - On lifelong anticoagulation, currently on dabigatran  Anemia -S/p IV Feraheme weekly x 2 in 05/2024, today's iron  studies are pending    PLAN: -Labs reviewed -Proceed with Trastuzumab , continue monthly indefinitely  -Will f/up pending iron  studies from today -Lab, follow up, and next herceptin  in 4 weeks, or sooner if needed    All questions were answered. The patient knows to call the clinic with any problems, questions or concerns. No barriers to learning were detected.   Rhenda Oregon K  Ann, NP 07/07/2024

## 2024-07-07 ENCOUNTER — Inpatient Hospital Stay (HOSPITAL_BASED_OUTPATIENT_CLINIC_OR_DEPARTMENT_OTHER): Admitting: Nurse Practitioner

## 2024-07-07 ENCOUNTER — Inpatient Hospital Stay

## 2024-07-07 ENCOUNTER — Inpatient Hospital Stay: Attending: Oncology

## 2024-07-07 ENCOUNTER — Encounter: Payer: Self-pay | Admitting: Nurse Practitioner

## 2024-07-07 ENCOUNTER — Encounter: Payer: Self-pay | Admitting: Hematology and Oncology

## 2024-07-07 VITALS — BP 128/68 | HR 70 | Temp 98.4°F | Resp 17

## 2024-07-07 VITALS — BP 147/90 | HR 73 | Temp 97.6°F | Resp 18

## 2024-07-07 DIAGNOSIS — C787 Secondary malignant neoplasm of liver and intrahepatic bile duct: Secondary | ICD-10-CM | POA: Insufficient documentation

## 2024-07-07 DIAGNOSIS — Z171 Estrogen receptor negative status [ER-]: Secondary | ICD-10-CM | POA: Insufficient documentation

## 2024-07-07 DIAGNOSIS — C50911 Malignant neoplasm of unspecified site of right female breast: Secondary | ICD-10-CM

## 2024-07-07 DIAGNOSIS — Z95828 Presence of other vascular implants and grafts: Secondary | ICD-10-CM

## 2024-07-07 DIAGNOSIS — Z5112 Encounter for antineoplastic immunotherapy: Secondary | ICD-10-CM | POA: Diagnosis not present

## 2024-07-07 DIAGNOSIS — D508 Other iron deficiency anemias: Secondary | ICD-10-CM

## 2024-07-07 DIAGNOSIS — C7951 Secondary malignant neoplasm of bone: Secondary | ICD-10-CM | POA: Insufficient documentation

## 2024-07-07 DIAGNOSIS — C50919 Malignant neoplasm of unspecified site of unspecified female breast: Secondary | ICD-10-CM

## 2024-07-07 DIAGNOSIS — C50011 Malignant neoplasm of nipple and areola, right female breast: Secondary | ICD-10-CM

## 2024-07-07 DIAGNOSIS — C50912 Malignant neoplasm of unspecified site of left female breast: Secondary | ICD-10-CM | POA: Insufficient documentation

## 2024-07-07 DIAGNOSIS — D649 Anemia, unspecified: Secondary | ICD-10-CM | POA: Insufficient documentation

## 2024-07-07 LAB — CMP (CANCER CENTER ONLY)
ALT: 8 U/L (ref 0–44)
AST: 13 U/L — ABNORMAL LOW (ref 15–41)
Albumin: 3.3 g/dL — ABNORMAL LOW (ref 3.5–5.0)
Alkaline Phosphatase: 66 U/L (ref 38–126)
Anion gap: 6 (ref 5–15)
BUN: 27 mg/dL — ABNORMAL HIGH (ref 8–23)
CO2: 27 mmol/L (ref 22–32)
Calcium: 9.7 mg/dL (ref 8.9–10.3)
Chloride: 105 mmol/L (ref 98–111)
Creatinine: 1.06 mg/dL — ABNORMAL HIGH (ref 0.44–1.00)
GFR, Estimated: 56 mL/min — ABNORMAL LOW (ref >60)
Glucose, Bld: 114 mg/dL — ABNORMAL HIGH (ref 70–99)
Potassium: 4.9 mmol/L (ref 3.5–5.1)
Sodium: 138 mmol/L (ref 135–145)
Total Bilirubin: 0.7 mg/dL (ref 0.0–1.2)
Total Protein: 7.5 g/dL (ref 6.5–8.1)

## 2024-07-07 LAB — FERRITIN: Ferritin: 692 ng/mL — ABNORMAL HIGH (ref 11–307)

## 2024-07-07 LAB — CBC WITH DIFFERENTIAL (CANCER CENTER ONLY)
Abs Immature Granulocytes: 0.05 K/uL (ref 0.00–0.07)
Basophils Absolute: 0 K/uL (ref 0.0–0.1)
Basophils Relative: 0 %
Eosinophils Absolute: 0 K/uL (ref 0.0–0.5)
Eosinophils Relative: 0 %
HCT: 29.7 % — ABNORMAL LOW (ref 36.0–46.0)
Hemoglobin: 10.7 g/dL — ABNORMAL LOW (ref 12.0–15.0)
Immature Granulocytes: 0 %
Lymphocytes Relative: 11 %
Lymphs Abs: 1.6 K/uL (ref 0.7–4.0)
MCH: 28.5 pg (ref 26.0–34.0)
MCHC: 36 g/dL (ref 30.0–36.0)
MCV: 79.2 fL — ABNORMAL LOW (ref 80.0–100.0)
Monocytes Absolute: 0.5 K/uL (ref 0.1–1.0)
Monocytes Relative: 4 %
Neutro Abs: 11.5 K/uL — ABNORMAL HIGH (ref 1.7–7.7)
Neutrophils Relative %: 85 %
Platelet Count: 259 K/uL (ref 150–400)
RBC: 3.75 MIL/uL — ABNORMAL LOW (ref 3.87–5.11)
RDW: 14.6 % (ref 11.5–15.5)
WBC Count: 13.7 K/uL — ABNORMAL HIGH (ref 4.0–10.5)
nRBC: 0 % (ref 0.0–0.2)

## 2024-07-07 LAB — IRON AND IRON BINDING CAPACITY (CC-WL,HP ONLY)
Iron: 124 ug/dL (ref 28–170)
Saturation Ratios: 47 % — ABNORMAL HIGH (ref 10.4–31.8)
TIBC: 266 ug/dL (ref 250–450)
UIBC: 142 ug/dL — ABNORMAL LOW (ref 148–442)

## 2024-07-07 LAB — VITAMIN B12: Vitamin B-12: 538 pg/mL (ref 180–914)

## 2024-07-07 MED ORDER — LORAZEPAM 2 MG/ML IJ SOLN
1.0000 mg | Freq: Once | INTRAMUSCULAR | Status: AC
  Administered 2024-07-07: 1 mg via INTRAVENOUS
  Filled 2024-07-07: qty 1

## 2024-07-07 MED ORDER — ACETAMINOPHEN 325 MG PO TABS
650.0000 mg | ORAL_TABLET | Freq: Once | ORAL | Status: AC
  Administered 2024-07-07: 650 mg via ORAL
  Filled 2024-07-07: qty 2

## 2024-07-07 MED ORDER — SODIUM CHLORIDE 0.9 % IV SOLN: Freq: Once | INTRAVENOUS | Status: AC

## 2024-07-07 MED ORDER — SODIUM CHLORIDE 0.9% FLUSH
10.0000 mL | Freq: Once | INTRAVENOUS | Status: AC
  Administered 2024-07-07: 10 mL

## 2024-07-07 MED ORDER — TRASTUZUMAB-DTTB CHEMO 150 MG IV SOLR
750.0000 mg | Freq: Once | INTRAVENOUS | Status: AC
  Administered 2024-07-07: 750 mg via INTRAVENOUS
  Filled 2024-07-07: qty 35.71

## 2024-07-07 NOTE — Patient Instructions (Signed)
 [facility] - A DEPT OF [facility]  Discharge Instructions: Thank you for choosing [facility] to provide your oncology and hematology care.   If you have a lab appointment with the Cancer Center, please go directly to the Cancer Center and check in at the registration area.   Wear comfortable clothing and clothing appropriate for easy access to any Portacath or PICC line.   We strive to give you quality time with your provider. You may need to reschedule your appointment if you arrive late (15 or more minutes).  Arriving late affects you and other patients whose appointments are after yours.  Also, if you miss three or more appointments without notifying the office, you may be dismissed from the clinic at the provider's discretion.      For prescription refill requests, have your pharmacy contact our office and allow 72 hours for refills to be completed.    Today you received the following chemotherapy and/or immunotherapy agents: trastuzumab -dttb (ONTRUZANT )    To help prevent nausea and vomiting after your treatment, we encourage you to take your nausea medication as directed.  BELOW ARE SYMPTOMS THAT SHOULD BE REPORTED IMMEDIATELY: *FEVER GREATER THAN 100.4 F (38 C) OR HIGHER *CHILLS OR SWEATING *NAUSEA AND VOMITING THAT IS NOT CONTROLLED WITH YOUR NAUSEA MEDICATION *UNUSUAL SHORTNESS OF BREATH *UNUSUAL BRUISING OR BLEEDING *URINARY PROBLEMS (pain or burning when urinating, or frequent urination) *BOWEL PROBLEMS (unusual diarrhea, constipation, pain near the anus) TENDERNESS IN MOUTH AND THROAT WITH OR WITHOUT PRESENCE OF ULCERS (sore throat, sores in mouth, or a toothache) UNUSUAL RASH, SWELLING OR PAIN  UNUSUAL VAGINAL DISCHARGE OR ITCHING   Items with * indicate a potential emergency and should be followed up as soon as possible or go to the Emergency Department if any problems should occur.  Please show the CHEMOTHERAPY ALERT CARD  or IMMUNOTHERAPY ALERT CARD at check-in to the Emergency Department and triage nurse.  Should you have questions after your visit or need to cancel or reschedule your appointment, please contact [facility] - A DEPT OF [facility]  Dept: [phone]  and follow the prompts.  Office hours are 8:00 a.m. to 4:30 p.m. Monday - Friday. Please note that voicemails left after 4:00 p.m. may not be returned until the following business day.  We are closed weekends and major holidays. You have access to a nurse at all times for urgent questions. Please call the main number to the clinic Dept: [phone] and follow the prompts.   For any non-urgent questions, you may also contact your provider using MyChart. We now offer e-Visits for anyone 49 and older to request care online for non-urgent symptoms. For details visit [email or web address].   Also download the MyChart app! Go to the app store, search MyChart, open the app, select [facility], and log in with your MyChart username and password.

## 2024-07-08 DIAGNOSIS — M170 Bilateral primary osteoarthritis of knee: Secondary | ICD-10-CM | POA: Diagnosis not present

## 2024-07-08 DIAGNOSIS — Z86711 Personal history of pulmonary embolism: Secondary | ICD-10-CM | POA: Diagnosis not present

## 2024-07-08 DIAGNOSIS — E669 Obesity, unspecified: Secondary | ICD-10-CM | POA: Diagnosis not present

## 2024-07-08 DIAGNOSIS — Z853 Personal history of malignant neoplasm of breast: Secondary | ICD-10-CM | POA: Diagnosis not present

## 2024-07-08 DIAGNOSIS — K219 Gastro-esophageal reflux disease without esophagitis: Secondary | ICD-10-CM | POA: Diagnosis not present

## 2024-07-08 DIAGNOSIS — I110 Hypertensive heart disease with heart failure: Secondary | ICD-10-CM | POA: Diagnosis not present

## 2024-07-08 DIAGNOSIS — M4628 Osteomyelitis of vertebra, sacral and sacrococcygeal region: Secondary | ICD-10-CM | POA: Diagnosis not present

## 2024-07-08 DIAGNOSIS — F411 Generalized anxiety disorder: Secondary | ICD-10-CM | POA: Diagnosis not present

## 2024-07-08 DIAGNOSIS — Z7901 Long term (current) use of anticoagulants: Secondary | ICD-10-CM | POA: Diagnosis not present

## 2024-07-08 DIAGNOSIS — D649 Anemia, unspecified: Secondary | ICD-10-CM | POA: Diagnosis not present

## 2024-07-08 DIAGNOSIS — M6281 Muscle weakness (generalized): Secondary | ICD-10-CM | POA: Diagnosis not present

## 2024-07-08 DIAGNOSIS — Z79891 Long term (current) use of opiate analgesic: Secondary | ICD-10-CM | POA: Diagnosis not present

## 2024-07-08 DIAGNOSIS — Z6841 Body Mass Index (BMI) 40.0 and over, adult: Secondary | ICD-10-CM | POA: Diagnosis not present

## 2024-07-08 DIAGNOSIS — R32 Unspecified urinary incontinence: Secondary | ICD-10-CM | POA: Diagnosis not present

## 2024-07-08 DIAGNOSIS — I5030 Unspecified diastolic (congestive) heart failure: Secondary | ICD-10-CM | POA: Diagnosis not present

## 2024-07-08 DIAGNOSIS — G4700 Insomnia, unspecified: Secondary | ICD-10-CM | POA: Diagnosis not present

## 2024-07-08 DIAGNOSIS — E785 Hyperlipidemia, unspecified: Secondary | ICD-10-CM | POA: Diagnosis not present

## 2024-07-10 ENCOUNTER — Other Ambulatory Visit: Payer: Self-pay

## 2024-07-12 ENCOUNTER — Other Ambulatory Visit: Payer: Self-pay

## 2024-07-14 DIAGNOSIS — M4628 Osteomyelitis of vertebra, sacral and sacrococcygeal region: Secondary | ICD-10-CM | POA: Diagnosis not present

## 2024-07-14 DIAGNOSIS — I110 Hypertensive heart disease with heart failure: Secondary | ICD-10-CM | POA: Diagnosis not present

## 2024-07-14 DIAGNOSIS — Z6841 Body Mass Index (BMI) 40.0 and over, adult: Secondary | ICD-10-CM | POA: Diagnosis not present

## 2024-07-14 DIAGNOSIS — E669 Obesity, unspecified: Secondary | ICD-10-CM | POA: Diagnosis not present

## 2024-07-14 DIAGNOSIS — M170 Bilateral primary osteoarthritis of knee: Secondary | ICD-10-CM | POA: Diagnosis not present

## 2024-07-14 DIAGNOSIS — I5030 Unspecified diastolic (congestive) heart failure: Secondary | ICD-10-CM | POA: Diagnosis not present

## 2024-07-15 DIAGNOSIS — I110 Hypertensive heart disease with heart failure: Secondary | ICD-10-CM | POA: Diagnosis not present

## 2024-07-15 DIAGNOSIS — Z6841 Body Mass Index (BMI) 40.0 and over, adult: Secondary | ICD-10-CM | POA: Diagnosis not present

## 2024-07-15 DIAGNOSIS — M170 Bilateral primary osteoarthritis of knee: Secondary | ICD-10-CM | POA: Diagnosis not present

## 2024-07-15 DIAGNOSIS — I5030 Unspecified diastolic (congestive) heart failure: Secondary | ICD-10-CM | POA: Diagnosis not present

## 2024-07-15 DIAGNOSIS — M4628 Osteomyelitis of vertebra, sacral and sacrococcygeal region: Secondary | ICD-10-CM | POA: Diagnosis not present

## 2024-07-15 DIAGNOSIS — E669 Obesity, unspecified: Secondary | ICD-10-CM | POA: Diagnosis not present

## 2024-07-16 DIAGNOSIS — M170 Bilateral primary osteoarthritis of knee: Secondary | ICD-10-CM | POA: Diagnosis not present

## 2024-07-16 DIAGNOSIS — I110 Hypertensive heart disease with heart failure: Secondary | ICD-10-CM | POA: Diagnosis not present

## 2024-07-16 DIAGNOSIS — M4628 Osteomyelitis of vertebra, sacral and sacrococcygeal region: Secondary | ICD-10-CM | POA: Diagnosis not present

## 2024-07-16 DIAGNOSIS — I5030 Unspecified diastolic (congestive) heart failure: Secondary | ICD-10-CM | POA: Diagnosis not present

## 2024-07-16 DIAGNOSIS — Z6841 Body Mass Index (BMI) 40.0 and over, adult: Secondary | ICD-10-CM | POA: Diagnosis not present

## 2024-07-16 DIAGNOSIS — E669 Obesity, unspecified: Secondary | ICD-10-CM | POA: Diagnosis not present

## 2024-07-17 DIAGNOSIS — I110 Hypertensive heart disease with heart failure: Secondary | ICD-10-CM | POA: Diagnosis not present

## 2024-07-17 DIAGNOSIS — Z6841 Body Mass Index (BMI) 40.0 and over, adult: Secondary | ICD-10-CM | POA: Diagnosis not present

## 2024-07-17 DIAGNOSIS — M4628 Osteomyelitis of vertebra, sacral and sacrococcygeal region: Secondary | ICD-10-CM | POA: Diagnosis not present

## 2024-07-17 DIAGNOSIS — I5030 Unspecified diastolic (congestive) heart failure: Secondary | ICD-10-CM | POA: Diagnosis not present

## 2024-07-17 DIAGNOSIS — E669 Obesity, unspecified: Secondary | ICD-10-CM | POA: Diagnosis not present

## 2024-07-17 DIAGNOSIS — M170 Bilateral primary osteoarthritis of knee: Secondary | ICD-10-CM | POA: Diagnosis not present

## 2024-07-20 DIAGNOSIS — M170 Bilateral primary osteoarthritis of knee: Secondary | ICD-10-CM | POA: Diagnosis not present

## 2024-07-20 DIAGNOSIS — E669 Obesity, unspecified: Secondary | ICD-10-CM | POA: Diagnosis not present

## 2024-07-20 DIAGNOSIS — I5030 Unspecified diastolic (congestive) heart failure: Secondary | ICD-10-CM | POA: Diagnosis not present

## 2024-07-20 DIAGNOSIS — Z6841 Body Mass Index (BMI) 40.0 and over, adult: Secondary | ICD-10-CM | POA: Diagnosis not present

## 2024-07-20 DIAGNOSIS — I110 Hypertensive heart disease with heart failure: Secondary | ICD-10-CM | POA: Diagnosis not present

## 2024-07-20 DIAGNOSIS — M4628 Osteomyelitis of vertebra, sacral and sacrococcygeal region: Secondary | ICD-10-CM | POA: Diagnosis not present

## 2024-07-21 DIAGNOSIS — M170 Bilateral primary osteoarthritis of knee: Secondary | ICD-10-CM | POA: Diagnosis not present

## 2024-07-21 DIAGNOSIS — E669 Obesity, unspecified: Secondary | ICD-10-CM | POA: Diagnosis not present

## 2024-07-21 DIAGNOSIS — Z6841 Body Mass Index (BMI) 40.0 and over, adult: Secondary | ICD-10-CM | POA: Diagnosis not present

## 2024-07-21 DIAGNOSIS — M4628 Osteomyelitis of vertebra, sacral and sacrococcygeal region: Secondary | ICD-10-CM | POA: Diagnosis not present

## 2024-07-21 DIAGNOSIS — I110 Hypertensive heart disease with heart failure: Secondary | ICD-10-CM | POA: Diagnosis not present

## 2024-07-21 DIAGNOSIS — I5030 Unspecified diastolic (congestive) heart failure: Secondary | ICD-10-CM | POA: Diagnosis not present

## 2024-07-22 DIAGNOSIS — E669 Obesity, unspecified: Secondary | ICD-10-CM | POA: Diagnosis not present

## 2024-07-22 DIAGNOSIS — Z6841 Body Mass Index (BMI) 40.0 and over, adult: Secondary | ICD-10-CM | POA: Diagnosis not present

## 2024-07-22 DIAGNOSIS — M170 Bilateral primary osteoarthritis of knee: Secondary | ICD-10-CM | POA: Diagnosis not present

## 2024-07-22 DIAGNOSIS — I5030 Unspecified diastolic (congestive) heart failure: Secondary | ICD-10-CM | POA: Diagnosis not present

## 2024-07-22 DIAGNOSIS — M4628 Osteomyelitis of vertebra, sacral and sacrococcygeal region: Secondary | ICD-10-CM | POA: Diagnosis not present

## 2024-07-22 DIAGNOSIS — I110 Hypertensive heart disease with heart failure: Secondary | ICD-10-CM | POA: Diagnosis not present

## 2024-07-27 DIAGNOSIS — I5030 Unspecified diastolic (congestive) heart failure: Secondary | ICD-10-CM | POA: Diagnosis not present

## 2024-07-27 DIAGNOSIS — M4628 Osteomyelitis of vertebra, sacral and sacrococcygeal region: Secondary | ICD-10-CM | POA: Diagnosis not present

## 2024-07-27 DIAGNOSIS — E669 Obesity, unspecified: Secondary | ICD-10-CM | POA: Diagnosis not present

## 2024-07-27 DIAGNOSIS — I110 Hypertensive heart disease with heart failure: Secondary | ICD-10-CM | POA: Diagnosis not present

## 2024-07-27 DIAGNOSIS — M170 Bilateral primary osteoarthritis of knee: Secondary | ICD-10-CM | POA: Diagnosis not present

## 2024-07-27 DIAGNOSIS — Z6841 Body Mass Index (BMI) 40.0 and over, adult: Secondary | ICD-10-CM | POA: Diagnosis not present

## 2024-07-28 DIAGNOSIS — S12112A Nondisplaced Type II dens fracture, initial encounter for closed fracture: Secondary | ICD-10-CM | POA: Diagnosis not present

## 2024-07-30 DIAGNOSIS — M4628 Osteomyelitis of vertebra, sacral and sacrococcygeal region: Secondary | ICD-10-CM | POA: Diagnosis not present

## 2024-07-30 DIAGNOSIS — M170 Bilateral primary osteoarthritis of knee: Secondary | ICD-10-CM | POA: Diagnosis not present

## 2024-07-30 DIAGNOSIS — I110 Hypertensive heart disease with heart failure: Secondary | ICD-10-CM | POA: Diagnosis not present

## 2024-07-30 DIAGNOSIS — E669 Obesity, unspecified: Secondary | ICD-10-CM | POA: Diagnosis not present

## 2024-07-30 DIAGNOSIS — Z6841 Body Mass Index (BMI) 40.0 and over, adult: Secondary | ICD-10-CM | POA: Diagnosis not present

## 2024-07-30 DIAGNOSIS — I5030 Unspecified diastolic (congestive) heart failure: Secondary | ICD-10-CM | POA: Diagnosis not present

## 2024-08-03 DIAGNOSIS — I110 Hypertensive heart disease with heart failure: Secondary | ICD-10-CM | POA: Diagnosis not present

## 2024-08-03 DIAGNOSIS — E669 Obesity, unspecified: Secondary | ICD-10-CM | POA: Diagnosis not present

## 2024-08-03 DIAGNOSIS — M170 Bilateral primary osteoarthritis of knee: Secondary | ICD-10-CM | POA: Diagnosis not present

## 2024-08-03 DIAGNOSIS — M4628 Osteomyelitis of vertebra, sacral and sacrococcygeal region: Secondary | ICD-10-CM | POA: Diagnosis not present

## 2024-08-03 DIAGNOSIS — I5030 Unspecified diastolic (congestive) heart failure: Secondary | ICD-10-CM | POA: Diagnosis not present

## 2024-08-03 DIAGNOSIS — Z6841 Body Mass Index (BMI) 40.0 and over, adult: Secondary | ICD-10-CM | POA: Diagnosis not present

## 2024-08-04 ENCOUNTER — Inpatient Hospital Stay: Attending: Oncology

## 2024-08-04 ENCOUNTER — Inpatient Hospital Stay

## 2024-08-04 ENCOUNTER — Inpatient Hospital Stay: Admitting: Adult Health

## 2024-08-05 DIAGNOSIS — Z6841 Body Mass Index (BMI) 40.0 and over, adult: Secondary | ICD-10-CM | POA: Diagnosis not present

## 2024-08-05 DIAGNOSIS — I5030 Unspecified diastolic (congestive) heart failure: Secondary | ICD-10-CM | POA: Diagnosis not present

## 2024-08-05 DIAGNOSIS — M4628 Osteomyelitis of vertebra, sacral and sacrococcygeal region: Secondary | ICD-10-CM | POA: Diagnosis not present

## 2024-08-05 DIAGNOSIS — E669 Obesity, unspecified: Secondary | ICD-10-CM | POA: Diagnosis not present

## 2024-08-05 DIAGNOSIS — I110 Hypertensive heart disease with heart failure: Secondary | ICD-10-CM | POA: Diagnosis not present

## 2024-08-05 DIAGNOSIS — M170 Bilateral primary osteoarthritis of knee: Secondary | ICD-10-CM | POA: Diagnosis not present

## 2024-08-10 ENCOUNTER — Inpatient Hospital Stay

## 2024-08-10 ENCOUNTER — Telehealth: Payer: Self-pay

## 2024-08-10 NOTE — Telephone Encounter (Signed)
 Called and spoke to [name] and she is rescheduled now for labs and infusion on 12/18 at 1215. She understands and will be here then. Dr. Gudena does not need to see her as there are no open slots, but she does need to get her infusion. Andrea CHRISTELLA Plunk, RN

## 2024-08-11 ENCOUNTER — Other Ambulatory Visit: Payer: Self-pay | Admitting: Pharmacist

## 2024-08-11 ENCOUNTER — Encounter: Payer: Self-pay | Admitting: Hematology and Oncology

## 2024-08-11 ENCOUNTER — Other Ambulatory Visit: Payer: Self-pay

## 2024-08-11 DIAGNOSIS — C50911 Malignant neoplasm of unspecified site of right female breast: Secondary | ICD-10-CM

## 2024-08-11 NOTE — Progress Notes (Signed)
 Per Dr. Gudena, patient can come in on 08/13/24 for trastuzumab  infusion. Updated cycle dates to align. Scheduling aware. Receives every 28 days.

## 2024-08-12 ENCOUNTER — Other Ambulatory Visit: Payer: Self-pay

## 2024-08-13 ENCOUNTER — Inpatient Hospital Stay: Attending: Oncology

## 2024-08-13 ENCOUNTER — Inpatient Hospital Stay

## 2024-08-13 VITALS — BP 117/92 | HR 78 | Temp 98.4°F | Resp 18

## 2024-08-13 DIAGNOSIS — C50911 Malignant neoplasm of unspecified site of right female breast: Secondary | ICD-10-CM

## 2024-08-13 DIAGNOSIS — Z5112 Encounter for antineoplastic immunotherapy: Secondary | ICD-10-CM | POA: Insufficient documentation

## 2024-08-13 DIAGNOSIS — C50912 Malignant neoplasm of unspecified site of left female breast: Secondary | ICD-10-CM | POA: Diagnosis present

## 2024-08-13 LAB — CBC WITH DIFFERENTIAL (CANCER CENTER ONLY)
Abs Immature Granulocytes: 0.02 K/uL (ref 0.00–0.07)
Basophils Absolute: 0.1 K/uL (ref 0.0–0.1)
Basophils Relative: 1 %
Eosinophils Absolute: 0.6 K/uL — ABNORMAL HIGH (ref 0.0–0.5)
Eosinophils Relative: 7 %
HCT: 30.9 % — ABNORMAL LOW (ref 36.0–46.0)
Hemoglobin: 10.8 g/dL — ABNORMAL LOW (ref 12.0–15.0)
Immature Granulocytes: 0 %
Lymphocytes Relative: 33 %
Lymphs Abs: 2.8 K/uL (ref 0.7–4.0)
MCH: 28.5 pg (ref 26.0–34.0)
MCHC: 35 g/dL (ref 30.0–36.0)
MCV: 81.5 fL (ref 80.0–100.0)
Monocytes Absolute: 0.5 K/uL (ref 0.1–1.0)
Monocytes Relative: 6 %
Neutro Abs: 4.5 K/uL (ref 1.7–7.7)
Neutrophils Relative %: 53 %
Platelet Count: 278 K/uL (ref 150–400)
RBC: 3.79 MIL/uL — ABNORMAL LOW (ref 3.87–5.11)
RDW: 15.7 % — ABNORMAL HIGH (ref 11.5–15.5)
WBC Count: 8.5 K/uL (ref 4.0–10.5)
nRBC: 0 % (ref 0.0–0.2)

## 2024-08-13 LAB — CMP (CANCER CENTER ONLY)
ALT: 9 U/L (ref 0–44)
AST: 16 U/L (ref 15–41)
Albumin: 3.5 g/dL (ref 3.5–5.0)
Alkaline Phosphatase: 79 U/L (ref 38–126)
Anion gap: 8 (ref 5–15)
BUN: 29 mg/dL — ABNORMAL HIGH (ref 8–23)
CO2: 26 mmol/L (ref 22–32)
Calcium: 9.5 mg/dL (ref 8.9–10.3)
Chloride: 107 mmol/L (ref 98–111)
Creatinine: 1.16 mg/dL — ABNORMAL HIGH (ref 0.44–1.00)
GFR, Estimated: 50 mL/min — ABNORMAL LOW (ref >60)
Glucose, Bld: 94 mg/dL (ref 70–99)
Potassium: 4.9 mmol/L (ref 3.5–5.1)
Sodium: 141 mmol/L (ref 135–145)
Total Bilirubin: 0.3 mg/dL (ref 0.0–1.2)
Total Protein: 7.1 g/dL (ref 6.5–8.1)

## 2024-08-13 MED ORDER — SODIUM CHLORIDE 0.9% FLUSH: 10.0000 mL | INTRAVENOUS | Status: DC | PRN

## 2024-08-13 MED ORDER — LORAZEPAM 2 MG/ML IJ SOLN
1.0000 mg | Freq: Once | INTRAMUSCULAR | Status: AC
  Administered 2024-08-13: 13:00:00 1 mg via INTRAVENOUS
  Filled 2024-08-13: qty 1

## 2024-08-13 MED ORDER — ACETAMINOPHEN 325 MG PO TABS
650.0000 mg | ORAL_TABLET | Freq: Once | ORAL | Status: AC
  Administered 2024-08-13: 13:00:00 650 mg via ORAL
  Filled 2024-08-13: qty 2

## 2024-08-13 MED ORDER — SODIUM CHLORIDE 0.9 % IV SOLN: Freq: Once | INTRAVENOUS | Status: AC

## 2024-08-13 MED ORDER — TRASTUZUMAB-DTTB CHEMO 150 MG IV SOLR
750.0000 mg | Freq: Once | INTRAVENOUS | Status: AC
  Administered 2024-08-13: 14:00:00 750 mg via INTRAVENOUS
  Filled 2024-08-13: qty 35.71

## 2024-08-13 NOTE — Patient Instructions (Signed)
 [facility] - A DEPT OF [facility]  Discharge Instructions: Thank you for choosing [facility] to provide your oncology and hematology care.   If you have a lab appointment with the Cancer Center, please go directly to the Cancer Center and check in at the registration area.   Wear comfortable clothing and clothing appropriate for easy access to any Portacath or PICC line.   We strive to give you quality time with your provider. You may need to reschedule your appointment if you arrive late (15 or more minutes).  Arriving late affects you and other patients whose appointments are after yours.  Also, if you miss three or more appointments without notifying the office, you may be dismissed from the clinic at the provider's discretion.      For prescription refill requests, have your pharmacy contact our office and allow 72 hours for refills to be completed.    Today you received the following chemotherapy and/or immunotherapy agents: trastuzumab -dttb (ONTRUZANT )    To help prevent nausea and vomiting after your treatment, we encourage you to take your nausea medication as directed.  BELOW ARE SYMPTOMS THAT SHOULD BE REPORTED IMMEDIATELY: *FEVER GREATER THAN 100.4 F (38 C) OR HIGHER *CHILLS OR SWEATING *NAUSEA AND VOMITING THAT IS NOT CONTROLLED WITH YOUR NAUSEA MEDICATION *UNUSUAL SHORTNESS OF BREATH *UNUSUAL BRUISING OR BLEEDING *URINARY PROBLEMS (pain or burning when urinating, or frequent urination) *BOWEL PROBLEMS (unusual diarrhea, constipation, pain near the anus) TENDERNESS IN MOUTH AND THROAT WITH OR WITHOUT PRESENCE OF ULCERS (sore throat, sores in mouth, or a toothache) UNUSUAL RASH, SWELLING OR PAIN  UNUSUAL VAGINAL DISCHARGE OR ITCHING   Items with * indicate a potential emergency and should be followed up as soon as possible or go to the Emergency Department if any problems should occur.  Please show the CHEMOTHERAPY ALERT CARD  or IMMUNOTHERAPY ALERT CARD at check-in to the Emergency Department and triage nurse.  Should you have questions after your visit or need to cancel or reschedule your appointment, please contact [facility] - A DEPT OF [facility]  Dept: [phone]  and follow the prompts.  Office hours are 8:00 a.m. to 4:30 p.m. Monday - Friday. Please note that voicemails left after 4:00 p.m. may not be returned until the following business day.  We are closed weekends and major holidays. You have access to a nurse at all times for urgent questions. Please call the main number to the clinic Dept: [phone] and follow the prompts.   For any non-urgent questions, you may also contact your provider using MyChart. We now offer e-Visits for anyone 49 and older to request care online for non-urgent symptoms. For details visit [email or web address].   Also download the MyChart app! Go to the app store, search MyChart, open the app, select [facility], and log in with your MyChart username and password.

## 2024-08-18 ENCOUNTER — Inpatient Hospital Stay

## 2024-08-18 ENCOUNTER — Inpatient Hospital Stay: Admitting: Hematology and Oncology

## 2024-08-28 DIAGNOSIS — I361 Nonrheumatic tricuspid (valve) insufficiency: Secondary | ICD-10-CM | POA: Diagnosis not present

## 2024-08-28 DIAGNOSIS — I340 Nonrheumatic mitral (valve) insufficiency: Secondary | ICD-10-CM | POA: Diagnosis not present

## 2024-08-28 DIAGNOSIS — I351 Nonrheumatic aortic (valve) insufficiency: Secondary | ICD-10-CM | POA: Diagnosis not present

## 2024-08-28 DIAGNOSIS — I371 Nonrheumatic pulmonary valve insufficiency: Secondary | ICD-10-CM | POA: Diagnosis not present

## 2024-09-07 ENCOUNTER — Telehealth: Payer: Self-pay

## 2024-09-07 NOTE — Transitions of Care (Post Inpatient/ED Visit) (Signed)
" ° °  09/07/2024  Name: [name] MRN: [id number] DOB: 19-Feb-1951  Today's TOC FU Call Status: Today's TOC FU Call Status:: Unsuccessful Call (1st Attempt) Unsuccessful Call (1st Attempt) Date: 09/07/24  Attempted to reach the patient regarding the most recent Inpatient/ED visit.  Follow Up Plan: Additional outreach attempts will be made to reach the patient to complete the Transitions of Care (Post Inpatient/ED visit) call.    Bing Edison MSN, RN RN [employer] [facility]  [facility] Office Hours M-F [phone] Direct Dial: [phone] Main Phone [phone]  Fax: [phone] [email or web address]    "

## 2024-09-08 ENCOUNTER — Telehealth: Payer: Self-pay

## 2024-09-08 NOTE — Transitions of Care (Post Inpatient/ED Visit) (Signed)
" ° °  09/08/2024  Name: [name] MRN: [id number] DOB: 13-Jul-1951  Today's TOC FU Call Status: Today's TOC FU Call Status:: Unsuccessful Call (2nd Attempt) Unsuccessful Call (2nd Attempt) Date: 09/08/24  Attempted to reach the patient regarding the most recent Inpatient/ED visit.  Follow Up Plan: Additional outreach attempts will be made to reach the patient to complete the Transitions of Care (Post Inpatient/ED visit) call.    Bing Edison MSN, RN RN [employer] [facility]  [facility] Office Hours M-F [phone] Direct Dial: [phone] Main Phone [phone]  Fax: [phone] [email or web address]    "

## 2024-09-10 ENCOUNTER — Telehealth: Payer: Self-pay | Admitting: *Deleted

## 2024-09-10 NOTE — Transitions of Care (Post Inpatient/ED Visit) (Signed)
" ° °  09/10/2024  Name: [name] MRN: [id number] DOB: 10-06-1950  Today's TOC FU Call Status: Today's TOC FU Call Status:: Unsuccessful Call (3rd Attempt) Unsuccessful Call (3rd Attempt) Date: 09/10/24  Attempted to reach the patient regarding the most recent Inpatient/ED visit.  Follow Up Plan: No further outreach attempts will be made at this time. We have been unable to contact the patient.  Andrea Dimes RN, BSN [place]  [facility] RN Care Manager [phone]  "

## 2024-09-23 ENCOUNTER — Other Ambulatory Visit: Payer: Self-pay

## 2024-09-23 ENCOUNTER — Encounter: Payer: Self-pay | Admitting: Hematology and Oncology

## 2024-09-23 ENCOUNTER — Other Ambulatory Visit: Payer: Self-pay | Admitting: Hematology and Oncology

## 2024-09-23 DIAGNOSIS — C50911 Malignant neoplasm of unspecified site of right female breast: Secondary | ICD-10-CM

## 2024-09-23 NOTE — Progress Notes (Signed)
 Pt lab orders placed per Gudena MD for CBC and CMP w/ next lab visit prior to tx and Gudena MD visit.

## 2024-09-24 ENCOUNTER — Other Ambulatory Visit: Payer: Self-pay

## 2024-09-29 ENCOUNTER — Other Ambulatory Visit: Payer: Self-pay | Admitting: Nurse Practitioner

## 2024-09-29 DIAGNOSIS — D508 Other iron deficiency anemias: Secondary | ICD-10-CM

## 2024-09-29 NOTE — Progress Notes (Unsigned)
 " Patient Care Team: Jefferey Fitch, MD as PCP - General (Internal Medicine) Glynda Franky NOVAK., MD as Consulting Physician (Pain Medicine) Larnell Purchase, MD as Referring Physician (Orthopedic Surgery) Vola Duncans, DO as Consulting Physician (Orthopedic Surgery) Rolan Ezra RAMAN, MD as Consulting Physician (Cardiology) Bensimhon, Toribio SAUNDERS, MD as Consulting Physician (Cardiology) Ardeen Rollene Kass, MD (Radiology) Fleeta Rothman, Jomarie SAILOR, MD as Consulting Physician (Infectious Diseases) Shannon Agent, MD as Consulting Physician (Radiation Oncology) Odean Potts, MD as Consulting Physician (Hematology and Oncology) Crawford Morna Pickle, NP as Nurse Practitioner (Hematology and Oncology)  Clinic Day:  09/30/2024  Referring physician: Jefferey Fitch, MD  ASSESSMENT & PLAN:   Assessment & Plan: Breast cancer metastasized to multiple sites [facility]) 2004: Liver. Lung and bone mets Neoadj chemo TCH X 6 completed April 2005 foll by herceptin  maintenance   bilateral mastectomies with bilateral axillary lymph node dissection 12/07/2004, showing             (a) on the right, a mypT1c ypN1 invasive ductal carcinoma, grade 3, estrogen and progesterone receptor negative, HER-2 positive, with an MIB-1 of 31%             (b) on the left, ypT2 ypN1 invasive ductal carcinoma, grade 2, estrogen and progesterone receptor negative, HER-2 positive, with an MIB-1 of 35%.   Adj XRT ----------------------------------------------------------------------------------------------------------------------------------------  Current treatment: Herceptin  maintenance therapy Plan to continue Herceptin  indefinitely every 28 days. SVC syndrome: On lifelong anticoagulation.  (Because of cost issues we may be switching her from Eliquis  to Pradaxa.  I gave her additional information about Pradaxa and the cost) Bone scan 09/27/2022: No bone metastases ECHO: 10/31/22: EF 55% (Dr. Rolan is doing her echocardiograms now  every 9 months) CT CAP 05/05/2023: No evidence of recurrent or metastatic disease in chest abdomen pelvis CT CAP 11/04/2023: No evidence of recurrent or metastatic disease.  Healing or healed sternal/anterolateral right fifth and sixth rib fractures. CT CAP 03/12/2024: Esophagitis, trace right pleural effusion, left SI joint ? Septic arthritis, no evidence of metastases Brain MRI 03/16/2024: No brain metastases, small bilateral cervical lymph nodes nonspecific Anticoagulation: Currently on dabigatran Continue monthly Herceptin  infusions. 08/28/2024 - echo -  She has mild left ventricular hypertrophy and grade 1 diastolic dysfunction which is consistent with low to normal left ventricular filling pressure. Left ventricular ejection fraction is 30 to 35%.she has moderate mitral valve regurgitation and mild tricuspid valve regurgitation.   09/30/2024 - herceptin  held and patient to be evaluated by her cardiolgy/oncologist, Dr. Rolan.    Iron  deficiency anemia Today, Hgb is 10.3 and HCT 29.5.  Iron  panel is unremarkable.  Awaiting results of ferritin.  She does not meet requirements for blood transfusion or iron  infusion today.  Will continue to monitor closely.  Decreased LVEF Patient underwent echocardiogram while hospitalized on 08/28/2024. She has mild left ventricular hypertrophy and grade 1 diastolic dysfunction which is consistent with low to normal left ventricular filling pressure. Left ventricular ejection fraction is 30 to 35%.she has moderate mitral valve regurgitation and mild tricuspid valve regurgitation.   - Treatment with Herceptin  held due to decreased LVEF. - Patient has seen Dr. Rolan, cardiology/oncology.  Message sent to him to schedule evaluation in the near future.  Plan Labs reviewed. - Mild and stable anemia. - Iron  panel unremarkable. - Awaiting results of ferritin. - Decreased renal functions, likely result of recent hospitalization. Reviewed echocardiogram with decreased  LVEF. Herceptin  held today. Patient to see cardiology/oncology, Dr. Rolan. Plan for labs/flush, follow-up, and Herceptin  in 4  weeks, sooner as possible.  The patient understands the plans discussed today and is in agreement with them.  She knows to contact our office if she develops concerns prior to her next appointment.  I provided 25 minutes of face-to-face time during this encounter and > 50% was spent counseling as documented under my assessment and plan.    Powell FORBES Lessen, NP  [facility] - A DEPT OF [facility] [address] [place] [address] Dept: [phone] Dept Fax: [phone]   No orders of the defined types were placed in this encounter.     CHIEF COMPLAINT:  CC: Metastatic right cancer, ER -, HER2 +  Current Treatment: Maintenance trastuzumab  monthly  INTERVAL HISTORY:  [name] is here today for repeat clinical assessment. She last saw [name], NP on 07/07/2024. She continues with monthly trastuzumab .  She had echocardiogram done on 08/28/2024. She has mild left ventricular hypertrophy and grade 1 diastolic dysfunction which is consistent with low to normal left ventricular filling pressure. Left ventricular ejection fraction is 30 to 35%.she has moderate mitral valve regurgitation and mild tricuspid valve regurgitation.  Previous echocardiogram done June 2025, showed hyperdynamic ejection fraction of 70 to 75%.  Will plan to hold Herceptin  today.  She states she has seen Dr. Rolan, cardiology/oncology.  Will send a message to him regarding patient consultation in the near future.  [name] states that she was hospitalized from 1/2 through 09/04/2024 at [facility] due to pneumonia.  She states she went to the hospital via ambulance because she was unable to breathe.  She denies cough or wheezing at the time.  Imaging was positive for pneumonia.  She states she spent 5 days in ICU and then 2 days  in a regular room.  Upon discharge, she has had weakness and pain in her knees.  This is consistent.  She is receiving physical therapy to help build up her strength.  She denies continued shortness of breath and has 97% O2 saturation on room air.  Currently, she denies chest pain, chest pressure, or shortness of breath. She denies headaches or visual disturbances. She denies abdominal pain, nausea, vomiting, or changes in bowel or bladder habits.  She states that she has continued weakness and is struggling to participate in normal activities.  She denies fevers or chills. She denies pain. Her appetite is good. Her weight has been stable.  I have reviewed the past medical history, past surgical history, social history and family history with the patient and they are unchanged from previous note.  ALLERGIES:  is allergic to penicillins, adhesive [tape], ceftriaxone , and daptomycin .  MEDICATIONS:  Current Outpatient Medications  Medication Sig Dispense Refill   acetaminophen  (TYLENOL ) 500 MG tablet Take 500-1,000 mg by mouth every 6 (six) hours as needed for mild pain (pain score 1-3), fever or moderate pain (pain score 4-6).     albuterol  (VENTOLIN  HFA) 108 (90 Base) MCG/ACT inhaler Inhale 2 puffs into the lungs every 6 (six) hours as needed for wheezing. 1 each 6   ALPRAZolam  (XANAX ) 1 MG tablet Take 1 tablet (1 mg total) by mouth 3 (three) times daily as needed for anxiety. 10 tablet 3   amiodarone  (PACERONE ) 200 MG tablet Take 1 tablet (200 mg total) by mouth 2 (two) times daily.     apixaban  (ELIQUIS ) 5 MG TABS tablet Take 1 tablet (5 mg total) by mouth 2 (two) times daily. 180 tablet 3  atorvastatin (LIPITOR) 20 MG tablet Take 20 mg by mouth daily.     CALCIUM  PO Take 2 tablets by mouth daily.     cyclobenzaprine  (FLEXERIL ) 5 MG tablet Take 1 tablet (5 mg total) by mouth 2 (two) times daily as needed for muscle spasms. 60 tablet 0   DULoxetine  (CYMBALTA ) 60 MG capsule Take 60 mg by mouth  daily.     furosemide  (LASIX ) 40 MG tablet Take 40 mg by mouth as needed for fluid or edema.     gabapentin  (NEURONTIN ) 300 MG capsule TAKE 2 CAPSULES BY MOUTH THREE TIMES DAILY 540 capsule 1   omeprazole (PRILOSEC) 20 MG capsule Take 20 mg by mouth daily.     ondansetron  (ZOFRAN ) 8 MG tablet Take 1 tablet (8 mg total) by mouth every 8 (eight) hours as needed for nausea or vomiting. 30 tablet 0   oxyCODONE  (OXY IR/ROXICODONE ) 5 MG immediate release tablet Take 1 tablet (5 mg total) by mouth every 4 (four) hours as needed for moderate pain (pain score 4-6) or severe pain (pain score 7-10). 10 tablet 0   pantoprazole  (PROTONIX ) 40 MG tablet Take 1 tablet (40 mg total) by mouth daily. 30 tablet 0   potassium chloride  SA (KLOR-CON  M) 20 MEQ tablet Take 2 tablets (40 mEq total) by mouth daily. Take 2 tablets twice a day for 2 days and after that 2 tablets once a day 90 tablet 3   rosuvastatin  (CRESTOR ) 10 MG tablet Take 1 tablet (10 mg total) by mouth daily. 30 tablet 11   temazepam  (RESTORIL ) 30 MG capsule Take 1 capsule (30 mg total) by mouth at bedtime as needed for sleep. 2 capsule 0   No current facility-administered medications for this visit.   Facility-Administered Medications Ordered in Other Visits  Medication Dose Route Frequency Provider Last Rate Last Admin   sodium chloride  flush (NS) 0.9 % injection 10 mL  10 mL Intravenous PRN Magrinat, Gustav C, MD   10 mL at 12/15/15 1200   sodium chloride  flush (NS) 0.9 % injection 10 mL  10 mL Intracatheter PRN Magrinat, Sandria BROCKS, MD   10 mL at 08/14/18 1116   sodium chloride  flush (NS) 0.9 % injection 10 mL  10 mL Intracatheter PRN Magrinat, Sandria BROCKS, MD        HISTORY OF PRESENT ILLNESS:   Oncology History  Breast cancer metastasized to multiple sites [facility])  09/05/2012 - 09/27/2022 Chemotherapy   Patient is on Treatment Plan : BREAST Trastuzumab  q28d     02/26/2013 Initial Diagnosis   history of inflammatory right breast cancer metastatic at  presentation September 2004 with involvement of liver and bone, HER-2 positive, estrogen and progesterone receptor negative      - 11/2013 Chemotherapy   carboplatin, docetaxel and Herceptin  x 6 completed April 2005     Surgery   bilateral mastectomies with bilateral axillary lymph node dissection 12/07/2004, showing             (a) on the right, a mypT1c ypN1 invasive ductal carcinoma, grade 3, estrogen and progesterone receptor negative, HER-2 positive, with an MIB-1 of 31%             (b) on the left, ypT2 ypN1 invasive ductal carcinoma, grade 2, estrogen and progesterone receptor negative, HER-2 positive, with an MIB-1 of 35%.   01/2015 - 02/2015 Radiation Therapy   Adj XRT    - 03/2018 Chemotherapy   Ixampra   10/25/2022 -  Chemotherapy   Patient is  on Treatment Plan : BREAST Trastuzumab  IV (8/6) or SQ (600) D1 q21d         REVIEW OF SYSTEMS:   Constitutional: Denies fevers, chills or abnormal weight loss Eyes: Denies blurriness of vision Ears, nose, mouth, throat, and face: Denies mucositis or sore throat Respiratory: Denies cough, dyspnea or wheezes Cardiovascular: Denies palpitation, chest discomfort or lower extremity swelling Gastrointestinal:  Denies nausea, heartburn or change in bowel habits Skin: Denies abnormal skin rashes Lymphatics: Denies new lymphadenopathy or easy bruising Neurological:Denies numbness, tingling or new weaknesses Behavioral/Psych: Mood is stable, no new changes  All other systems were reviewed with the patient and are negative.   VITALS:   Today's Vitals   09/30/24 0849  BP: 116/69  Pulse: (!) 52  Resp: 17  Temp: (!) 97 F (36.1 C)  SpO2: 97%  Weight: 235 lb (106.6 kg)  PainSc: 7    Body mass index is 41.63 kg/m.    Wt Readings from Last 3 Encounters:  09/30/24 235 lb (106.6 kg)  03/29/24 235 lb 14.3 oz (107 kg)  03/12/24 259 lb (117.5 kg)    Body mass index is 41.63 kg/m.  Performance status (ECOG): 2 - Symptomatic, <50%  confined to bed  PHYSICAL EXAM:   GENERAL:alert, no distress and comfortable SKIN: skin color, texture, turgor are normal, no rashes or significant lesions EYES: normal, Conjunctiva are pink and non-injected, sclera clear OROPHARYNX:no exudate, no erythema and lips, buccal mucosa, and tongue normal  NECK: supple, thyroid normal size, non-tender, without nodularity LYMPH:  no palpable lymphadenopathy in the cervical, axillary or inguinal LUNGS: clear to auscultation and percussion with normal breathing effort HEART: regular rate & rhythm and no murmurs and no lower extremity edema ABDOMEN:abdomen soft, non-tender and normal bowel sounds Musculoskeletal:no cyanosis of digits and no clubbing  NEURO: alert & oriented x 3 with fluent speech, no focal motor/sensory deficits  LABORATORY DATA:  I have reviewed the data as listed    Component Value Date/Time   NA 141 09/30/2024 0825   NA 143 08/14/2017 0811   K 4.7 09/30/2024 0825   K 3.8 08/14/2017 0811   CL 108 09/30/2024 0825   CL 104 01/30/2013 0850   CO2 25 09/30/2024 0825   CO2 24 08/14/2017 0811   GLUCOSE 99 09/30/2024 0825   GLUCOSE 87 08/14/2017 0811   GLUCOSE 99 01/30/2013 0850   BUN 19 09/30/2024 0825   BUN 14.9 08/14/2017 0811   CREATININE 1.33 (H) 09/30/2024 0825   CREATININE 0.9 08/14/2017 0811   CALCIUM  9.0 09/30/2024 0825   CALCIUM  9.4 08/14/2017 0811   PROT 7.0 09/30/2024 0825   PROT 7.4 08/14/2017 0811   ALBUMIN  3.3 (L) 09/30/2024 0825   ALBUMIN  3.5 08/14/2017 0811   AST 18 09/30/2024 0825   AST 10 08/14/2017 0811   ALT 8 09/30/2024 0825   ALT 10 08/14/2017 0811   ALKPHOS 66 09/30/2024 0825   ALKPHOS 73 08/14/2017 0811   BILITOT 0.3 09/30/2024 0825   BILITOT 0.62 08/14/2017 0811   GFRNONAA 42 (L) 09/30/2024 0825   GFRNONAA >60 04/14/2010 0952   GFRAA >60 05/18/2020 0846   GFRAA >60 04/14/2010 0952    No results found for: SPEP, UPEP  Lab Results  Component Value Date   WBC 9.4 09/30/2024    NEUTROABS 4.6 09/30/2024   HGB 10.3 (L) 09/30/2024   HCT 29.5 (L) 09/30/2024   MCV 78.9 (L) 09/30/2024   PLT 312 09/30/2024      Chemistry  Component Value Date/Time   NA 141 09/30/2024 0825   NA 143 08/14/2017 0811   K 4.7 09/30/2024 0825   K 3.8 08/14/2017 0811   CL 108 09/30/2024 0825   CL 104 01/30/2013 0850   CO2 25 09/30/2024 0825   CO2 24 08/14/2017 0811   BUN 19 09/30/2024 0825   BUN 14.9 08/14/2017 0811   CREATININE 1.33 (H) 09/30/2024 0825   CREATININE 0.9 08/14/2017 0811      Component Value Date/Time   CALCIUM  9.0 09/30/2024 0825   CALCIUM  9.4 08/14/2017 0811   ALKPHOS 66 09/30/2024 0825   ALKPHOS 73 08/14/2017 0811   AST 18 09/30/2024 0825   AST 10 08/14/2017 0811   ALT 8 09/30/2024 0825   ALT 10 08/14/2017 0811   BILITOT 0.3 09/30/2024 0825   BILITOT 0.62 08/14/2017 0811       RADIOGRAPHIC STUDIES: I have personally reviewed the radiological images as listed and agreed with the findings in the report. No results found. "

## 2024-09-29 NOTE — Assessment & Plan Note (Signed)
 2004: Liver. Lung and bone mets Neoadj chemo TCH X 6 completed April 2005 foll by herceptin  maintenance   bilateral mastectomies with bilateral axillary lymph node dissection 12/07/2004, showing             (a) on the right, a mypT1c ypN1 invasive ductal carcinoma, grade 3, estrogen and progesterone receptor negative, HER-2 positive, with an MIB-1 of 31%             (b) on the left, ypT2 ypN1 invasive ductal carcinoma, grade 2, estrogen and progesterone receptor negative, HER-2 positive, with an MIB-1 of 35%.   Adj XRT ----------------------------------------------------------------------------------------------------------------------------------------  Current treatment: Herceptin  maintenance therapy Plan to continue Herceptin  indefinitely every 28 days. SVC syndrome: On lifelong anticoagulation.  (Because of cost issues we may be switching her from Eliquis  to Pradaxa.  I gave her additional information about Pradaxa and the cost) Bone scan 09/27/2022: No bone metastases ECHO: 10/31/22: EF 55% (Dr. Rolan is doing her echocardiograms now every 9 months) CT CAP 05/05/2023: No evidence of recurrent or metastatic disease in chest abdomen pelvis CT CAP 11/04/2023: No evidence of recurrent or metastatic disease.  Healing or healed sternal/anterolateral right fifth and sixth rib fractures. CT CAP 03/12/2024: Esophagitis, trace right pleural effusion, left SI joint ? Septic arthritis, no evidence of metastases Brain MRI 03/16/2024: No brain metastases, small bilateral cervical lymph nodes nonspecific Anticoagulation: Currently on dabigatran Continue monthly Herceptin  infusions. Scans every 6 months

## 2024-09-30 ENCOUNTER — Inpatient Hospital Stay

## 2024-09-30 ENCOUNTER — Inpatient Hospital Stay: Attending: Oncology | Admitting: Nurse Practitioner

## 2024-09-30 ENCOUNTER — Encounter: Payer: Self-pay | Admitting: Nurse Practitioner

## 2024-09-30 VITALS — BP 116/69 | HR 52 | Temp 97.0°F | Resp 17 | Wt 235.0 lb

## 2024-09-30 DIAGNOSIS — C50911 Malignant neoplasm of unspecified site of right female breast: Secondary | ICD-10-CM

## 2024-09-30 DIAGNOSIS — C50919 Malignant neoplasm of unspecified site of unspecified female breast: Secondary | ICD-10-CM

## 2024-09-30 DIAGNOSIS — D508 Other iron deficiency anemias: Secondary | ICD-10-CM

## 2024-09-30 LAB — IRON AND IRON BINDING CAPACITY (CC-WL,HP ONLY)
Iron: 49 ug/dL (ref 28–170)
Saturation Ratios: 20 % (ref 10.4–31.8)
TIBC: 241 ug/dL — ABNORMAL LOW (ref 250–450)
UIBC: 192 ug/dL

## 2024-09-30 LAB — CMP (CANCER CENTER ONLY)
ALT: 8 U/L (ref 0–44)
AST: 18 U/L (ref 15–41)
Albumin: 3.3 g/dL — ABNORMAL LOW (ref 3.5–5.0)
Alkaline Phosphatase: 66 U/L (ref 38–126)
Anion gap: 9 (ref 5–15)
BUN: 19 mg/dL (ref 8–23)
CO2: 25 mmol/L (ref 22–32)
Calcium: 9 mg/dL (ref 8.9–10.3)
Chloride: 108 mmol/L (ref 98–111)
Creatinine: 1.33 mg/dL — ABNORMAL HIGH (ref 0.44–1.00)
GFR, Estimated: 42 mL/min — ABNORMAL LOW
Glucose, Bld: 99 mg/dL (ref 70–99)
Potassium: 4.7 mmol/L (ref 3.5–5.1)
Sodium: 141 mmol/L (ref 135–145)
Total Bilirubin: 0.3 mg/dL (ref 0.0–1.2)
Total Protein: 7 g/dL (ref 6.5–8.1)

## 2024-09-30 LAB — CBC WITH DIFFERENTIAL (CANCER CENTER ONLY)
Abs Immature Granulocytes: 0.03 10*3/uL (ref 0.00–0.07)
Basophils Absolute: 0.1 10*3/uL (ref 0.0–0.1)
Basophils Relative: 1 %
Eosinophils Absolute: 0.9 10*3/uL — ABNORMAL HIGH (ref 0.0–0.5)
Eosinophils Relative: 10 %
HCT: 29.5 % — ABNORMAL LOW (ref 36.0–46.0)
Hemoglobin: 10.3 g/dL — ABNORMAL LOW (ref 12.0–15.0)
Immature Granulocytes: 0 %
Lymphocytes Relative: 33 %
Lymphs Abs: 3.1 10*3/uL (ref 0.7–4.0)
MCH: 27.5 pg (ref 26.0–34.0)
MCHC: 34.9 g/dL (ref 30.0–36.0)
MCV: 78.9 fL — ABNORMAL LOW (ref 80.0–100.0)
Monocytes Absolute: 0.7 10*3/uL (ref 0.1–1.0)
Monocytes Relative: 7 %
Neutro Abs: 4.6 10*3/uL (ref 1.7–7.7)
Neutrophils Relative %: 49 %
Platelet Count: 312 10*3/uL (ref 150–400)
RBC: 3.74 MIL/uL — ABNORMAL LOW (ref 3.87–5.11)
RDW: 15.9 % — ABNORMAL HIGH (ref 11.5–15.5)
WBC Count: 9.4 10*3/uL (ref 4.0–10.5)
nRBC: 0 % (ref 0.0–0.2)

## 2024-09-30 LAB — FERRITIN: Ferritin: 310 ng/mL — ABNORMAL HIGH (ref 11–307)

## 2024-10-01 ENCOUNTER — Ambulatory Visit (HOSPITAL_COMMUNITY): Admission: RE | Admit: 2024-10-01 | Discharge: 2024-10-01 | Attending: Cardiology | Admitting: Cardiology

## 2024-10-01 ENCOUNTER — Ambulatory Visit (HOSPITAL_COMMUNITY): Payer: Self-pay | Admitting: Cardiology

## 2024-10-01 DIAGNOSIS — I5032 Chronic diastolic (congestive) heart failure: Secondary | ICD-10-CM

## 2024-10-01 LAB — ECHOCARDIOGRAM COMPLETE
AR max vel: 2.84 cm2
AV Area VTI: 3.02 cm2
AV Area mean vel: 2.97 cm2
AV Mean grad: 3 mmHg
AV Peak grad: 5.3 mmHg
Ao pk vel: 1.15 m/s
Area-P 1/2: 3.27 cm2
Calc EF: 67.9 %
S' Lateral: 2.9 cm
Single Plane A2C EF: 73.6 %
Single Plane A4C EF: 59.3 %

## 2024-10-02 NOTE — Telephone Encounter (Signed)
 Patient notified

## 2024-10-06 ENCOUNTER — Ambulatory Visit (HOSPITAL_COMMUNITY): Admitting: Cardiology
